# Patient Record
Sex: Male | Born: 1950 | Race: White | Hispanic: No | Marital: Single | Smoking: Never smoker
Health system: Southern US, Community
[De-identification: ages and names within clinical notes are randomized; demographics above are authoritative.]

## PROBLEM LIST (undated history)

## (undated) DIAGNOSIS — M1990 Unspecified osteoarthritis, unspecified site: Secondary | ICD-10-CM

## (undated) DIAGNOSIS — L905 Scar conditions and fibrosis of skin: Secondary | ICD-10-CM

## (undated) DIAGNOSIS — E119 Type 2 diabetes mellitus without complications: Secondary | ICD-10-CM

## (undated) DIAGNOSIS — N186 End stage renal disease: Secondary | ICD-10-CM

## (undated) DIAGNOSIS — M109 Gout, unspecified: Secondary | ICD-10-CM

## (undated) DIAGNOSIS — M791 Myalgia, unspecified site: Secondary | ICD-10-CM

## (undated) DIAGNOSIS — R609 Edema, unspecified: Secondary | ICD-10-CM

## (undated) DIAGNOSIS — I10 Essential (primary) hypertension: Secondary | ICD-10-CM

## (undated) HISTORY — DX: Myalgia, unspecified site: M79.10

## (undated) HISTORY — DX: Edema, unspecified: R60.9

## (undated) HISTORY — DX: Scar conditions and fibrosis of skin: L90.5

## (undated) HISTORY — DX: Unspecified osteoarthritis, unspecified site: M19.90

## (undated) HISTORY — DX: Essential (primary) hypertension: I10

## (undated) HISTORY — PX: BELOW KNEE LEG AMPUTATION: SUR23

## (undated) HISTORY — DX: Gout, unspecified: M10.9

---

## 1998-03-01 ENCOUNTER — Emergency Department (HOSPITAL_COMMUNITY): Admission: EM | Admit: 1998-03-01 | Discharge: 1998-03-01 | Payer: Self-pay | Admitting: Emergency Medicine

## 1998-03-17 ENCOUNTER — Emergency Department (HOSPITAL_COMMUNITY): Admission: EM | Admit: 1998-03-17 | Discharge: 1998-03-17 | Payer: Self-pay | Admitting: Emergency Medicine

## 1998-04-28 ENCOUNTER — Emergency Department (HOSPITAL_COMMUNITY): Admission: EM | Admit: 1998-04-28 | Discharge: 1998-04-28 | Payer: Self-pay | Admitting: Emergency Medicine

## 1998-07-29 ENCOUNTER — Emergency Department (HOSPITAL_COMMUNITY): Admission: EM | Admit: 1998-07-29 | Discharge: 1998-07-29 | Payer: Self-pay | Admitting: Emergency Medicine

## 1998-10-07 ENCOUNTER — Emergency Department (HOSPITAL_COMMUNITY): Admission: EM | Admit: 1998-10-07 | Discharge: 1998-10-07 | Payer: Self-pay

## 2010-04-06 ENCOUNTER — Emergency Department (HOSPITAL_COMMUNITY): Admission: EM | Admit: 2010-04-06 | Discharge: 2010-04-06 | Payer: Self-pay | Admitting: Emergency Medicine

## 2013-10-18 ENCOUNTER — Encounter: Payer: Self-pay | Admitting: Podiatrist

## 2013-10-18 ENCOUNTER — Ambulatory Visit (INDEPENDENT_AMBULATORY_CARE_PROVIDER_SITE_OTHER): Payer: Medicare Other | Admitting: Podiatrist

## 2013-10-18 VITALS — BP 132/86 | HR 100 | Temp 99.4°F | Resp 18

## 2013-10-18 DIAGNOSIS — L97509 Non-pressure chronic ulcer of other part of unspecified foot with unspecified severity: Secondary | ICD-10-CM

## 2013-10-18 MED ORDER — CLINDAMYCIN HCL 300 MG PO CAPS: 300.0000 mg | ORAL_CAPSULE | Freq: Three times a day (TID) | ORAL | Status: DC

## 2013-10-18 MED ORDER — SILVER SULFADIAZINE 1 % EX CREA: 1.0000 "application " | TOPICAL_CREAM | Freq: Every day | CUTANEOUS | Status: DC

## 2013-10-18 NOTE — Progress Notes (Signed)
   Subjective:    Patient ID: Thomas [name], male    DOB: 1951-02-02, 62 y.o.   MRN: [id number]  HPI Nazire presents today for a open wound on the second toe of the left foot. Patient states that he ripped his toe nail out 09/07/2013 and he developed a skin wound at that point in time. He saw Dr. Sheryle Hail in December and he was put on clindamycin and a sulfa antibiotic. He states a sulfa antibiotic gave her a rash and he was switched to a lesser doseage of the clindamycin. He then went to his primary care physician who prescribed Keflex antibiotic of which he states has been ineffective it clearing of the infection of the 2nd toe.  The patient has gout and multiple back problems with multiple back surgeries and he states he has very minimal feeling especially on the left side.   Review of Systems  Constitutional: Positive for activity change and unexpected weight change.  HENT: Negative.   Eyes: Negative.   Respiratory: Negative.   Cardiovascular: Negative.   Gastrointestinal: Positive for constipation.  Endocrine: Positive for cold intolerance.  Genitourinary: Negative.   Musculoskeletal:       Joint and muscle and back pain and difficulty walking  Skin:       Open sores  Allergic/Immunologic: Negative.   Neurological: Positive for weakness and numbness.  Hematological: Negative.   Psychiatric/Behavioral: Negative.        Objective:   Physical Exam GENERAL APPEARANCE: Alert, conversant. Appropriately groomed. No acute distress.  VASCULAR: Pedal pulses palpable at 1/4 dp and pt bilateral.  Capillary refill time is immediate to all digits,  Proximal to distal cooling it warm to warm.   NEUROLOGIC: sensation is diminshed epicritically and protectively to 5.07 monofilament at 0/5 sites bilateral.  Light touch is diminished bilateral, vibratory sensation absent bilateral, achilles tendon reflex is absent bilateral.  MUSCULOSKELETAL: Enlarged left foot from gouty arthritis is noted. Right  foot also has gouty arthritis changes seen however not as significant as the left. The left second toe is significantly swollen up to 4 times larger than the third toe on the left foot. Gouty nodule plantar to the left second metatarsal is also noted. DERMATOLOGIC: Gouty arthritis changes are noted bilaterally. Significant swelling of the left second toe is present. A large ulceration measuring 6 mm in diameter is present with a red granular base noted. Bone also appears to be exposed within the wound as well and the patient states he has seen and within the wound in the past. No streaking or lymphangitis is present no pus or purulence is noted no undermining is seen.  X-ray reviewed after the visit from [facility] on canopy system shows degradation of the middle and distal phalanx of the left second toe. Swelling of the toe is also present. Changes could be consistent with gout versus osteomyelitis    Assessment & Plan:  Ulceration with cortical destruction of left second toe middle and distal phalanx Plan: Debrided all necrotic tissue from the left second toe ulceration. Applied Iodosorb and a dry sterile compressive dressing to the toe in instructed the patient on daily wound care and dressing changes. Prescribed antibiotic of clindamycin 300 mg 3 times a day. I will see him back in one week for recheck. If there is no improvement in symptoms clinically a amputation of the left second toe will be required. Briefly discussed the possibility of an amputation at today's visit with the patient.

## 2013-10-18 NOTE — Patient Instructions (Signed)
Instructions for Wound Care  The most important step to healing a foot wound is to reduce the pressure on your foot - it is extremely important to stay off your foot as much as possible   Cleanse your foot with saline wash or warm soapy water (dial antibacterial soap or similar).  Blot dry.  Apply prescribed medication to your wound and cover with gauze and a bandage.  May hold bandage in place with Coban (self sticky wrap), Ace bandage or tape.  You may find dressing supplies at your local [employer], [employer], drug store or medical supply store.  Your prescribed topical medication is :  Silvadene Cream (twice daily) to start-- then you will receive a tube of Iodosorb Gel and start using this instead once daily on the toe  [facility] [employer] is a mail order medical supply company that we use to provide some of our would care products.  If we use their service of you, you will receive the product by mail.  If you have not received the medication in 3 business days, please call our office.  If you notice any foul odor, increase in pain, pus, increased swelling, red streaks or generalized redness occurring in your foot or leg-Call our office immediately to be seen.  This may be a sign of a limb or life threatening infection that will need prompt attention.  Trudie Buckler, [place]  [phone] [facility]

## 2013-11-01 ENCOUNTER — Ambulatory Visit (INDEPENDENT_AMBULATORY_CARE_PROVIDER_SITE_OTHER): Payer: Medicare Other | Admitting: Podiatrist

## 2013-11-01 ENCOUNTER — Encounter: Payer: Self-pay | Admitting: Podiatrist

## 2013-11-01 VITALS — BP 96/64 | HR 90 | Temp 98.7°F | Resp 17 | Ht 78.0 in | Wt 299.0 lb

## 2013-11-01 DIAGNOSIS — L97509 Non-pressure chronic ulcer of other part of unspecified foot with unspecified severity: Secondary | ICD-10-CM

## 2013-11-01 NOTE — Progress Notes (Deleted)
Subjective: Pt present for follow up of left 2nd toe ulcer.  He has been taking the clindamycin as instructed and has been utilizing Iodosorb and a dressing to the left second toe. He states that it's looking good to him and is improving. Objective: Left second toe continues to be significantly swollen. There is also redness to the toe itself. The ulceration itself appears improved and is filling in from deep to superficial. A nice red granular base is present. Some gouty tophi are noted but no probing to bone is noted. Overall the toe appears improved from the previous visit.  Assessment: Gout and or osteomyelitis left second toe with ulceration  Plan: Debrided the necrotic tissue with a #15 blade without complication. Applied Iodosorb and a dry sterile compressive dressing. Instructed him to continue taking the clindamycin for 2 more weeks. He will continue to use the Iodosorb that time as well. We did talk about an amputation to the second toe if it has not significantly improved as it most likely has osteomyelitis present And will need to be removed. The patient is okay with this thought and we will rediscuss at the next visit.  Trudie Buckler, DPM

## 2013-11-01 NOTE — Patient Instructions (Signed)
Keep up the dressing changes on your left 2nd toe.  Stay on the antibiotic for 2 more weeks-- I will call in a refill for this medication for you.  If there is no improvement at the next visit, we will need to consider an amputation of a part or all of the toe to get the infection out.    Call if any questions or concerns.

## 2013-11-15 ENCOUNTER — Ambulatory Visit (INDEPENDENT_AMBULATORY_CARE_PROVIDER_SITE_OTHER): Payer: Medicare Other | Admitting: Podiatrist

## 2013-11-15 ENCOUNTER — Encounter: Payer: Self-pay | Admitting: Podiatrist

## 2013-11-15 VITALS — BP 118/78 | HR 98 | Resp 18

## 2013-11-15 DIAGNOSIS — L97509 Non-pressure chronic ulcer of other part of unspecified foot with unspecified severity: Secondary | ICD-10-CM

## 2013-11-15 NOTE — Patient Instructions (Signed)
Keep using the orange iodosorb on your toe.  Your toe looks significantly improved!

## 2013-11-15 NOTE — Progress Notes (Signed)
Subjective:  Pt presents stating "I am doing better on my 2nd toe on my left foot and there is some draining and I get my nerve block tomorrow."  Patient relates part of the toenail got ingrown and he pulled it out.  He also states he lanced the skin with a razor blade and all that came out was blood.  He has a nerve block for his back tomorrow and stopped the antibiotics 2 weeks ago in preparation for the block Objective: Improvement of the left second toe ulcer is noted however the Left second toe continues to be significantly swollen. The redness to the toe has improved despite being off antibiotics for 2 weeks. The ulceration itself appears improved and is filling in from deep to superficial. A nice red granular base is present. No gouty tophi is seen in no probing to bone is noted. Red granular base is noted with some superficial fibrotic tissue which is easily removed. Measurements continue to measure 5 mm x 4 mm depth is 1 mm. Assessment: Gout and or osteomyelitis left second toe with ulceration  Plan: Debrided the necrotic tissue with a #15 blade without complication. Applied Iodosorb and a dry sterile compressive dressing. Instructed him to resume taking the clindamycin when he is able after the block. He will continue to use the Iodosorb that time as well. We'll see him back in 3 weeks.

## 2013-11-15 NOTE — Progress Notes (Signed)
Subjective: Pt present for follow up of left 2nd toe ulcer.  He has been taking the clindamycin as instructed and has been utilizing Iodosorb and a dressing to the left second toe. He states that it's looking good to him and is improving. Objective: Left second toe continues to be significantly swollen. There is also redness to the toe itself. The ulceration itself appears improved and is filling in from deep to superficial. A nice red granular base is present. Some gouty tophi are noted but no probing to bone is noted. Overall the toe appears improved from the previous visit.  Assessment: Gout and or osteomyelitis left second toe with ulceration  Plan: Debrided the necrotic tissue with a #15 blade without complication. Applied Iodosorb and a dry sterile compressive dressing. Instructed him to continue taking the clindamycin for 2 more weeks. He will continue to use the Iodosorb that time as well. We did talk about an amputation to the second toe if it has not significantly improved as it most likely has osteomyelitis present And will need to be removed. The patient is okay with this thought and we will rediscuss at the next visit.  Trudie Buckler, DPM

## 2013-12-06 ENCOUNTER — Ambulatory Visit: Payer: Medicare Other | Admitting: Podiatrist

## 2016-03-20 DIAGNOSIS — N400 Enlarged prostate without lower urinary tract symptoms: Secondary | ICD-10-CM

## 2016-04-03 DIAGNOSIS — M109 Gout, unspecified: Secondary | ICD-10-CM

## 2016-04-03 DIAGNOSIS — E875 Hyperkalemia: Secondary | ICD-10-CM

## 2016-04-03 DIAGNOSIS — I25810 Atherosclerosis of coronary artery bypass graft(s) without angina pectoris: Secondary | ICD-10-CM

## 2016-04-03 DIAGNOSIS — E119 Type 2 diabetes mellitus without complications: Secondary | ICD-10-CM

## 2016-04-03 DIAGNOSIS — N179 Acute kidney failure, unspecified: Secondary | ICD-10-CM

## 2016-04-03 DIAGNOSIS — N390 Urinary tract infection, site not specified: Secondary | ICD-10-CM

## 2016-04-03 DIAGNOSIS — D539 Nutritional anemia, unspecified: Secondary | ICD-10-CM

## 2016-04-04 DIAGNOSIS — M109 Gout, unspecified: Secondary | ICD-10-CM

## 2016-04-04 DIAGNOSIS — D539 Nutritional anemia, unspecified: Secondary | ICD-10-CM

## 2016-04-04 DIAGNOSIS — E119 Type 2 diabetes mellitus without complications: Secondary | ICD-10-CM

## 2016-04-04 DIAGNOSIS — N390 Urinary tract infection, site not specified: Secondary | ICD-10-CM

## 2016-04-04 DIAGNOSIS — I25810 Atherosclerosis of coronary artery bypass graft(s) without angina pectoris: Secondary | ICD-10-CM | POA: Diagnosis not present

## 2016-04-04 DIAGNOSIS — N179 Acute kidney failure, unspecified: Secondary | ICD-10-CM

## 2016-04-04 DIAGNOSIS — E162 Hypoglycemia, unspecified: Secondary | ICD-10-CM

## 2016-04-04 DIAGNOSIS — E875 Hyperkalemia: Secondary | ICD-10-CM | POA: Diagnosis not present

## 2016-04-05 DIAGNOSIS — N179 Acute kidney failure, unspecified: Secondary | ICD-10-CM

## 2016-04-05 DIAGNOSIS — E875 Hyperkalemia: Secondary | ICD-10-CM | POA: Diagnosis not present

## 2016-04-05 DIAGNOSIS — I25810 Atherosclerosis of coronary artery bypass graft(s) without angina pectoris: Secondary | ICD-10-CM | POA: Diagnosis not present

## 2016-04-05 DIAGNOSIS — E119 Type 2 diabetes mellitus without complications: Secondary | ICD-10-CM

## 2016-04-05 DIAGNOSIS — N390 Urinary tract infection, site not specified: Secondary | ICD-10-CM

## 2016-04-05 DIAGNOSIS — D539 Nutritional anemia, unspecified: Secondary | ICD-10-CM

## 2016-04-05 DIAGNOSIS — M109 Gout, unspecified: Secondary | ICD-10-CM

## 2016-04-05 DIAGNOSIS — E162 Hypoglycemia, unspecified: Secondary | ICD-10-CM

## 2016-04-06 DIAGNOSIS — I25810 Atherosclerosis of coronary artery bypass graft(s) without angina pectoris: Secondary | ICD-10-CM

## 2016-04-06 DIAGNOSIS — E119 Type 2 diabetes mellitus without complications: Secondary | ICD-10-CM

## 2016-04-06 DIAGNOSIS — E162 Hypoglycemia, unspecified: Secondary | ICD-10-CM

## 2016-04-06 DIAGNOSIS — E875 Hyperkalemia: Secondary | ICD-10-CM

## 2016-04-06 DIAGNOSIS — D539 Nutritional anemia, unspecified: Secondary | ICD-10-CM

## 2016-04-06 DIAGNOSIS — N179 Acute kidney failure, unspecified: Secondary | ICD-10-CM

## 2016-04-06 DIAGNOSIS — N390 Urinary tract infection, site not specified: Secondary | ICD-10-CM

## 2016-04-06 DIAGNOSIS — M109 Gout, unspecified: Secondary | ICD-10-CM

## 2016-04-07 DIAGNOSIS — M109 Gout, unspecified: Secondary | ICD-10-CM

## 2016-04-07 DIAGNOSIS — D539 Nutritional anemia, unspecified: Secondary | ICD-10-CM

## 2016-04-07 DIAGNOSIS — E162 Hypoglycemia, unspecified: Secondary | ICD-10-CM

## 2016-04-07 DIAGNOSIS — I25810 Atherosclerosis of coronary artery bypass graft(s) without angina pectoris: Secondary | ICD-10-CM | POA: Diagnosis not present

## 2016-04-07 DIAGNOSIS — N390 Urinary tract infection, site not specified: Secondary | ICD-10-CM

## 2016-04-07 DIAGNOSIS — N179 Acute kidney failure, unspecified: Secondary | ICD-10-CM | POA: Diagnosis not present

## 2016-04-07 DIAGNOSIS — E119 Type 2 diabetes mellitus without complications: Secondary | ICD-10-CM

## 2016-04-07 DIAGNOSIS — E875 Hyperkalemia: Secondary | ICD-10-CM

## 2016-04-19 DIAGNOSIS — N400 Enlarged prostate without lower urinary tract symptoms: Secondary | ICD-10-CM

## 2016-04-19 DIAGNOSIS — N390 Urinary tract infection, site not specified: Secondary | ICD-10-CM | POA: Diagnosis not present

## 2016-04-19 DIAGNOSIS — N289 Disorder of kidney and ureter, unspecified: Secondary | ICD-10-CM

## 2016-04-19 DIAGNOSIS — E119 Type 2 diabetes mellitus without complications: Secondary | ICD-10-CM

## 2016-04-19 DIAGNOSIS — R531 Weakness: Secondary | ICD-10-CM | POA: Diagnosis not present

## 2016-04-19 DIAGNOSIS — D519 Vitamin B12 deficiency anemia, unspecified: Secondary | ICD-10-CM

## 2016-04-19 DIAGNOSIS — M109 Gout, unspecified: Secondary | ICD-10-CM | POA: Diagnosis not present

## 2016-04-20 DIAGNOSIS — M109 Gout, unspecified: Secondary | ICD-10-CM | POA: Diagnosis not present

## 2016-04-20 DIAGNOSIS — N390 Urinary tract infection, site not specified: Secondary | ICD-10-CM | POA: Diagnosis not present

## 2016-04-20 DIAGNOSIS — N289 Disorder of kidney and ureter, unspecified: Secondary | ICD-10-CM | POA: Diagnosis not present

## 2016-04-20 DIAGNOSIS — R531 Weakness: Secondary | ICD-10-CM | POA: Diagnosis not present

## 2016-04-21 DIAGNOSIS — N390 Urinary tract infection, site not specified: Secondary | ICD-10-CM | POA: Diagnosis not present

## 2016-04-21 DIAGNOSIS — N289 Disorder of kidney and ureter, unspecified: Secondary | ICD-10-CM | POA: Diagnosis not present

## 2016-04-21 DIAGNOSIS — M109 Gout, unspecified: Secondary | ICD-10-CM | POA: Diagnosis not present

## 2016-04-21 DIAGNOSIS — R531 Weakness: Secondary | ICD-10-CM | POA: Diagnosis not present

## 2016-05-07 DIAGNOSIS — E1122 Type 2 diabetes mellitus with diabetic chronic kidney disease: Secondary | ICD-10-CM

## 2016-05-07 DIAGNOSIS — N179 Acute kidney failure, unspecified: Secondary | ICD-10-CM | POA: Diagnosis not present

## 2016-05-07 DIAGNOSIS — I10 Essential (primary) hypertension: Secondary | ICD-10-CM

## 2016-05-07 DIAGNOSIS — E669 Obesity, unspecified: Secondary | ICD-10-CM

## 2016-05-07 DIAGNOSIS — M109 Gout, unspecified: Secondary | ICD-10-CM

## 2016-05-07 DIAGNOSIS — R001 Bradycardia, unspecified: Secondary | ICD-10-CM | POA: Diagnosis not present

## 2016-05-07 DIAGNOSIS — N132 Hydronephrosis with renal and ureteral calculous obstruction: Secondary | ICD-10-CM

## 2016-05-07 DIAGNOSIS — I5033 Acute on chronic diastolic (congestive) heart failure: Secondary | ICD-10-CM | POA: Diagnosis not present

## 2016-05-07 DIAGNOSIS — R55 Syncope and collapse: Secondary | ICD-10-CM | POA: Diagnosis not present

## 2016-08-25 DIAGNOSIS — E1122 Type 2 diabetes mellitus with diabetic chronic kidney disease: Secondary | ICD-10-CM | POA: Diagnosis not present

## 2016-08-25 DIAGNOSIS — M109 Gout, unspecified: Secondary | ICD-10-CM | POA: Diagnosis not present

## 2016-08-25 DIAGNOSIS — R55 Syncope and collapse: Secondary | ICD-10-CM | POA: Diagnosis not present

## 2016-08-25 DIAGNOSIS — I5030 Unspecified diastolic (congestive) heart failure: Secondary | ICD-10-CM | POA: Diagnosis not present

## 2016-08-26 DIAGNOSIS — M109 Gout, unspecified: Secondary | ICD-10-CM | POA: Diagnosis not present

## 2016-08-26 DIAGNOSIS — E1122 Type 2 diabetes mellitus with diabetic chronic kidney disease: Secondary | ICD-10-CM | POA: Diagnosis not present

## 2016-08-26 DIAGNOSIS — I5030 Unspecified diastolic (congestive) heart failure: Secondary | ICD-10-CM | POA: Diagnosis not present

## 2016-08-26 DIAGNOSIS — R55 Syncope and collapse: Secondary | ICD-10-CM | POA: Diagnosis not present

## 2017-02-23 DIAGNOSIS — I25810 Atherosclerosis of coronary artery bypass graft(s) without angina pectoris: Secondary | ICD-10-CM | POA: Diagnosis not present

## 2017-02-23 DIAGNOSIS — N132 Hydronephrosis with renal and ureteral calculous obstruction: Secondary | ICD-10-CM

## 2017-02-23 DIAGNOSIS — E785 Hyperlipidemia, unspecified: Secondary | ICD-10-CM | POA: Diagnosis not present

## 2017-02-23 DIAGNOSIS — I10 Essential (primary) hypertension: Secondary | ICD-10-CM | POA: Diagnosis not present

## 2017-02-23 DIAGNOSIS — E1122 Type 2 diabetes mellitus with diabetic chronic kidney disease: Secondary | ICD-10-CM

## 2017-02-23 DIAGNOSIS — I5030 Unspecified diastolic (congestive) heart failure: Secondary | ICD-10-CM

## 2017-02-24 DIAGNOSIS — E785 Hyperlipidemia, unspecified: Secondary | ICD-10-CM | POA: Diagnosis not present

## 2017-02-24 DIAGNOSIS — E1122 Type 2 diabetes mellitus with diabetic chronic kidney disease: Secondary | ICD-10-CM | POA: Diagnosis not present

## 2017-02-24 DIAGNOSIS — I25810 Atherosclerosis of coronary artery bypass graft(s) without angina pectoris: Secondary | ICD-10-CM | POA: Diagnosis not present

## 2017-02-24 DIAGNOSIS — I5030 Unspecified diastolic (congestive) heart failure: Secondary | ICD-10-CM | POA: Diagnosis not present

## 2017-02-24 DIAGNOSIS — N132 Hydronephrosis with renal and ureteral calculous obstruction: Secondary | ICD-10-CM | POA: Diagnosis not present

## 2017-02-24 DIAGNOSIS — I10 Essential (primary) hypertension: Secondary | ICD-10-CM | POA: Diagnosis not present

## 2017-02-25 DIAGNOSIS — E1122 Type 2 diabetes mellitus with diabetic chronic kidney disease: Secondary | ICD-10-CM | POA: Diagnosis not present

## 2017-02-25 DIAGNOSIS — I25810 Atherosclerosis of coronary artery bypass graft(s) without angina pectoris: Secondary | ICD-10-CM | POA: Diagnosis not present

## 2017-02-25 DIAGNOSIS — E785 Hyperlipidemia, unspecified: Secondary | ICD-10-CM | POA: Diagnosis not present

## 2017-02-25 DIAGNOSIS — I5030 Unspecified diastolic (congestive) heart failure: Secondary | ICD-10-CM | POA: Diagnosis not present

## 2017-02-25 DIAGNOSIS — I10 Essential (primary) hypertension: Secondary | ICD-10-CM | POA: Diagnosis not present

## 2017-02-25 DIAGNOSIS — N132 Hydronephrosis with renal and ureteral calculous obstruction: Secondary | ICD-10-CM | POA: Diagnosis not present

## 2017-02-26 DIAGNOSIS — N132 Hydronephrosis with renal and ureteral calculous obstruction: Secondary | ICD-10-CM | POA: Diagnosis not present

## 2017-02-26 DIAGNOSIS — I10 Essential (primary) hypertension: Secondary | ICD-10-CM | POA: Diagnosis not present

## 2017-02-26 DIAGNOSIS — I5030 Unspecified diastolic (congestive) heart failure: Secondary | ICD-10-CM | POA: Diagnosis not present

## 2017-02-26 DIAGNOSIS — E785 Hyperlipidemia, unspecified: Secondary | ICD-10-CM | POA: Diagnosis not present

## 2017-02-26 DIAGNOSIS — E1122 Type 2 diabetes mellitus with diabetic chronic kidney disease: Secondary | ICD-10-CM | POA: Diagnosis not present

## 2017-02-26 DIAGNOSIS — E11621 Type 2 diabetes mellitus with foot ulcer: Secondary | ICD-10-CM | POA: Diagnosis not present

## 2017-02-27 DIAGNOSIS — I10 Essential (primary) hypertension: Secondary | ICD-10-CM | POA: Diagnosis not present

## 2017-02-27 DIAGNOSIS — E785 Hyperlipidemia, unspecified: Secondary | ICD-10-CM | POA: Diagnosis not present

## 2017-02-27 DIAGNOSIS — I5030 Unspecified diastolic (congestive) heart failure: Secondary | ICD-10-CM | POA: Diagnosis not present

## 2017-02-27 DIAGNOSIS — E1122 Type 2 diabetes mellitus with diabetic chronic kidney disease: Secondary | ICD-10-CM | POA: Diagnosis not present

## 2017-02-27 DIAGNOSIS — N132 Hydronephrosis with renal and ureteral calculous obstruction: Secondary | ICD-10-CM | POA: Diagnosis not present

## 2017-02-27 DIAGNOSIS — I25810 Atherosclerosis of coronary artery bypass graft(s) without angina pectoris: Secondary | ICD-10-CM | POA: Diagnosis not present

## 2017-02-28 DIAGNOSIS — I5030 Unspecified diastolic (congestive) heart failure: Secondary | ICD-10-CM | POA: Diagnosis not present

## 2017-02-28 DIAGNOSIS — E1122 Type 2 diabetes mellitus with diabetic chronic kidney disease: Secondary | ICD-10-CM | POA: Diagnosis not present

## 2017-02-28 DIAGNOSIS — E785 Hyperlipidemia, unspecified: Secondary | ICD-10-CM | POA: Diagnosis not present

## 2017-02-28 DIAGNOSIS — I10 Essential (primary) hypertension: Secondary | ICD-10-CM | POA: Diagnosis not present

## 2017-02-28 DIAGNOSIS — I25810 Atherosclerosis of coronary artery bypass graft(s) without angina pectoris: Secondary | ICD-10-CM | POA: Diagnosis not present

## 2017-02-28 DIAGNOSIS — N132 Hydronephrosis with renal and ureteral calculous obstruction: Secondary | ICD-10-CM | POA: Diagnosis not present

## 2017-03-01 DIAGNOSIS — I5030 Unspecified diastolic (congestive) heart failure: Secondary | ICD-10-CM | POA: Diagnosis not present

## 2017-03-01 DIAGNOSIS — E1122 Type 2 diabetes mellitus with diabetic chronic kidney disease: Secondary | ICD-10-CM | POA: Diagnosis not present

## 2017-03-01 DIAGNOSIS — I10 Essential (primary) hypertension: Secondary | ICD-10-CM | POA: Diagnosis not present

## 2017-03-01 DIAGNOSIS — N132 Hydronephrosis with renal and ureteral calculous obstruction: Secondary | ICD-10-CM | POA: Diagnosis not present

## 2017-03-01 DIAGNOSIS — E785 Hyperlipidemia, unspecified: Secondary | ICD-10-CM | POA: Diagnosis not present

## 2017-03-01 DIAGNOSIS — I25810 Atherosclerosis of coronary artery bypass graft(s) without angina pectoris: Secondary | ICD-10-CM | POA: Diagnosis not present

## 2017-03-02 DIAGNOSIS — E11621 Type 2 diabetes mellitus with foot ulcer: Secondary | ICD-10-CM

## 2017-03-02 DIAGNOSIS — I10 Essential (primary) hypertension: Secondary | ICD-10-CM | POA: Diagnosis not present

## 2017-03-02 DIAGNOSIS — E1122 Type 2 diabetes mellitus with diabetic chronic kidney disease: Secondary | ICD-10-CM | POA: Diagnosis not present

## 2017-03-02 DIAGNOSIS — N132 Hydronephrosis with renal and ureteral calculous obstruction: Secondary | ICD-10-CM | POA: Diagnosis not present

## 2017-03-02 DIAGNOSIS — L97509 Non-pressure chronic ulcer of other part of unspecified foot with unspecified severity: Secondary | ICD-10-CM | POA: Diagnosis not present

## 2017-03-02 DIAGNOSIS — E785 Hyperlipidemia, unspecified: Secondary | ICD-10-CM | POA: Diagnosis not present

## 2017-03-02 DIAGNOSIS — I25810 Atherosclerosis of coronary artery bypass graft(s) without angina pectoris: Secondary | ICD-10-CM | POA: Diagnosis not present

## 2017-03-02 DIAGNOSIS — I5030 Unspecified diastolic (congestive) heart failure: Secondary | ICD-10-CM | POA: Diagnosis not present

## 2017-03-03 ENCOUNTER — Encounter: Payer: Self-pay | Admitting: Sports Medicine

## 2017-03-18 ENCOUNTER — Ambulatory Visit: Payer: Medicare Other | Admitting: Sports Medicine

## 2017-03-23 DIAGNOSIS — N400 Enlarged prostate without lower urinary tract symptoms: Secondary | ICD-10-CM | POA: Diagnosis not present

## 2017-03-23 DIAGNOSIS — K219 Gastro-esophageal reflux disease without esophagitis: Secondary | ICD-10-CM | POA: Diagnosis not present

## 2017-03-23 DIAGNOSIS — M109 Gout, unspecified: Secondary | ICD-10-CM | POA: Diagnosis not present

## 2017-03-23 DIAGNOSIS — N183 Chronic kidney disease, stage 3 (moderate): Secondary | ICD-10-CM

## 2017-03-23 DIAGNOSIS — E1122 Type 2 diabetes mellitus with diabetic chronic kidney disease: Secondary | ICD-10-CM | POA: Diagnosis not present

## 2017-03-23 DIAGNOSIS — E785 Hyperlipidemia, unspecified: Secondary | ICD-10-CM

## 2017-03-23 DIAGNOSIS — N200 Calculus of kidney: Secondary | ICD-10-CM | POA: Diagnosis not present

## 2017-03-24 DIAGNOSIS — K219 Gastro-esophageal reflux disease without esophagitis: Secondary | ICD-10-CM | POA: Diagnosis not present

## 2017-03-24 DIAGNOSIS — N400 Enlarged prostate without lower urinary tract symptoms: Secondary | ICD-10-CM | POA: Diagnosis not present

## 2017-03-24 DIAGNOSIS — N200 Calculus of kidney: Secondary | ICD-10-CM | POA: Diagnosis not present

## 2017-03-24 DIAGNOSIS — E785 Hyperlipidemia, unspecified: Secondary | ICD-10-CM | POA: Diagnosis not present

## 2017-03-24 DIAGNOSIS — M109 Gout, unspecified: Secondary | ICD-10-CM | POA: Diagnosis not present

## 2017-03-24 DIAGNOSIS — E1122 Type 2 diabetes mellitus with diabetic chronic kidney disease: Secondary | ICD-10-CM | POA: Diagnosis not present

## 2017-03-24 DIAGNOSIS — N183 Chronic kidney disease, stage 3 (moderate): Secondary | ICD-10-CM | POA: Diagnosis not present

## 2017-04-23 ENCOUNTER — Ambulatory Visit (INDEPENDENT_AMBULATORY_CARE_PROVIDER_SITE_OTHER): Payer: Medicare Other | Admitting: Sports Medicine

## 2017-04-23 DIAGNOSIS — L97511 Non-pressure chronic ulcer of other part of right foot limited to breakdown of skin: Secondary | ICD-10-CM

## 2017-04-23 DIAGNOSIS — M10071 Idiopathic gout, right ankle and foot: Secondary | ICD-10-CM | POA: Diagnosis not present

## 2017-04-23 DIAGNOSIS — M79671 Pain in right foot: Secondary | ICD-10-CM

## 2017-04-23 DIAGNOSIS — E11621 Type 2 diabetes mellitus with foot ulcer: Secondary | ICD-10-CM

## 2017-04-23 DIAGNOSIS — M779 Enthesopathy, unspecified: Secondary | ICD-10-CM | POA: Diagnosis not present

## 2017-04-23 MED ORDER — TRIAMCINOLONE ACETONIDE 10 MG/ML IJ SUSP: 10.0000 mg | Freq: Once | INTRAMUSCULAR | Status: DC

## 2017-04-23 NOTE — Progress Notes (Signed)
   Subjective:    Patient ID: Thomas [name], male    DOB: 1951/07/26, 66 y.o.   MRN: [id number]  HPI  I have an ulcer on the bottom of my right foot I have no idea how long it has been there the hospital found it when I was admitted in May.    Review of Systems  Constitutional: Positive for activity change and fatigue.  HENT: Positive for sore throat and tinnitus.   Eyes: Positive for visual disturbance.  Respiratory: Positive for shortness of breath.   Cardiovascular: Positive for leg swelling.  Gastrointestinal: Positive for constipation.  Endocrine: Positive for cold intolerance.  Skin: Positive for color change, rash and wound.  Neurological: Positive for weakness and numbness.  All other systems reviewed and are negative.      Objective:   Physical Exam        Assessment & Plan:

## 2017-04-23 NOTE — Progress Notes (Signed)
Subjective: Thomas [name] is a 66 y.o. male patient seen in office for evaluation of ulceration of the right foot. Patient has a history of diabetes and a blood glucose level today not recorded. Patient is changing the dressing using bactroban at home. Denies nausea/fever/vomiting/chills/night sweats/shortness of breath/pain. Patient states that he has new pain on side of right foot with a new gouty bump. Patient has no other pedal complaints at this time.  There are no active problems to display for this patient.  Current Outpatient Prescriptions on File Prior to Visit  Medication Sig Dispense Refill  . clindamycin (CLEOCIN) 300 MG capsule Take 1 capsule (300 mg total) by mouth 3 (three) times daily. (Patient not taking: Reported on 04/23/2017) 30 capsule 2  . silver sulfADIAZINE (SILVADENE) 1 % cream Apply 1 application topically daily. (Patient not taking: Reported on 04/23/2017) 50 g 0   No current facility-administered medications on file prior to visit.    Allergies  Allergen Reactions  . Oxycodone-Acetaminophen Other (See Comments)    Blood pressure issues  . Oxymorphone Other (See Comments)    Causes kidney problems  . Beta Vulgaris   . Buspirone   . Cabbage   . Codeine   . Fish-Derived Products   . Pentazocine   . Propoxyphene   . Shellfish-Derived Products   . Sulfa Antibiotics Hives    No results found for this or any previous visit (from the past 2160 hour(s)).  Objective: There were no vitals filed for this visit.  General: Patient is awake, alert, oriented x 3 and in no acute distress.  Dermatology: Skin is warm and dry bilateral with a partial thickness ulceration present right plantar midfoot. Ulceration measures 1 cm x 1 cm x 0.3cm cm. There is a keratotic border with a 100% granular base. The ulceration does not probe to bone. There is no malodor, no active drainage, no erythema, no edema. 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 No acute signs of infection.    Vascular: Dorsalis Pedis pulse = 1/4 Bilateral,  Posterior Tibial pulse =1 /4 Bilateral,  Capillary Fill Time < 5 seconds  Neurologic: Protective sensation absent bilateral.  Musculosketal: There is pain at the 5th met head on right with new tophi and severe foot deformity secondary to severe gout.   No results for input(s): GRAMSTAIN, LABORGA in the last 8760 hours.  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, limited to breakdown of skin ([place])    -  Primary   Relevant Medications   glipiZIDE (GLUCOTROL XL) 10 MG 24 hr tablet   Foot pain, right       Relevant Medications   triamcinolone acetonide (KENALOG) 10 MG/ML injection 10 mg (Start on 04/23/2017 10:15 PM)   Acute idiopathic gout of right foot       Relevant Medications   colchicine 0.6 MG tablet   tiZANidine (ZANAFLEX) 4 MG tablet   triamcinolone acetonide (KENALOG) 10 MG/ML injection 10 mg (Start on 04/23/2017 10:15 PM)   Capsulitis       Relevant Medications   triamcinolone acetonide (KENALOG) 10 MG/ML injection 10 mg (Start on 04/23/2017 10:15 PM)     -Examined patient and discussed the progression of the wound and treatment alternatives. - Excisionally dedbrided ulceration to healthy bleeding borders using a sterile chisel  blade. -Applied bactroban and dry  sterile dressing and instructed patient to continue with daily dressings at home consisting of the same - Advised patient to go to the ER or return to office if the wound worsens or if constitutional symptoms are present. -After oral consent and aseptic prep, injected a mixture containing 1 ml of 2% plain lidocaine, 1 ml 0.5% plain marcaine, 0.5 ml of kenalog 10 and 0.5 ml of dexamethasone phosphate into right 5th metatarsophalangeal joint without complication. Post-injection care discussed with patient.  -Patient to return to office in 3  weeks for follow up care and evaluation or sooner if problems arise.  Landis Martins, DPM

## 2017-05-15 ENCOUNTER — Ambulatory Visit (INDEPENDENT_AMBULATORY_CARE_PROVIDER_SITE_OTHER): Payer: Medicare Other | Admitting: Sports Medicine

## 2017-05-15 DIAGNOSIS — E11621 Type 2 diabetes mellitus with foot ulcer: Secondary | ICD-10-CM

## 2017-05-15 DIAGNOSIS — M79671 Pain in right foot: Secondary | ICD-10-CM

## 2017-05-15 DIAGNOSIS — L97511 Non-pressure chronic ulcer of other part of right foot limited to breakdown of skin: Secondary | ICD-10-CM | POA: Diagnosis not present

## 2017-05-15 DIAGNOSIS — Z8739 Personal history of other diseases of the musculoskeletal system and connective tissue: Secondary | ICD-10-CM

## 2017-05-15 DIAGNOSIS — M10071 Idiopathic gout, right ankle and foot: Secondary | ICD-10-CM

## 2017-05-15 NOTE — Progress Notes (Signed)
Subjective: Thomas [name] is a 66 y.o. male patient seen in office for follow-up evaluation of ulceration of the right foot. Patient has a history of diabetes and a blood glucose level today not recorded. Patient is changing the dressing using bactroban at home. Denies nausea/fever/vomiting/chills/night sweats/shortness of breath/pain. Patient states that pain at the right fifth toe is better after I gave him an injection last visit. Patient has no other pedal complaints at this time.  There are no active problems to display for this patient.  Current Outpatient Prescriptions on File Prior to Visit  Medication Sig Dispense Refill  . furosemide (LASIX) 40 MG tablet Take 40 mg by mouth 2 (two) times daily.    . bethanechol (URECHOLINE) 50 MG tablet Take 50 mg by mouth.    . clindamycin (CLEOCIN) 300 MG capsule Take 1 capsule (300 mg total) by mouth 3 (three) times daily. (Patient not taking: Reported on 04/23/2017) 30 capsule 2  . cloNIDine (CATAPRES) 0.3 MG tablet Take 0.3 mg by mouth.    . colchicine 0.6 MG tablet Take 0.6 mg by mouth.    . gabapentin (NEURONTIN) 300 MG capsule     . glipiZIDE (GLUCOTROL XL) 10 MG 24 hr tablet     . Levothyroxine Sodium (SYNTHROID PO) Take by mouth.    . mupirocin ointment (BACTROBAN) 2 % Place 1 application into the nose 2 (two) times daily.    . silver sulfADIAZINE (SILVADENE) 1 % cream Apply 1 application topically daily. (Patient not taking: Reported on 04/23/2017) 50 g 0  . tiZANidine (ZANAFLEX) 4 MG tablet      Current Facility-Administered Medications on File Prior to Visit  Medication Dose Route Frequency Provider Last Rate Last Dose  . triamcinolone acetonide (KENALOG) 10 MG/ML injection 10 mg  10 mg Other Once Landis Martins, DPM       Allergies  Allergen Reactions  . Oxycodone-Acetaminophen Other (See Comments)    Blood pressure issues  . Oxymorphone Other (See Comments)    Causes kidney problems  . Beta Vulgaris   . Buspirone   . Cabbage    . Codeine   . Fish-Derived Products   . Pentazocine   . Propoxyphene   . Shellfish-Derived Products   . Sulfa Antibiotics Hives    No results found for this or any previous visit (from the past 2160 hour(s)).  Objective: There were no vitals filed for this visit.  General: Patient is awake, alert, oriented x 3 and in no acute distress.  Dermatology: Skin is warm and dry bilateral with a partial thickness ulceration present right plantar midfoot. Ulceration measures 2 cm x 1.8 cm x 0.3cm larger than previous. There is a keratotic border with a 100% granular base. The ulceration does not probe to bone. There is no malodor, no active drainage, no erythema, no edema. 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 No acute signs of infection.   Vascular: Dorsalis Pedis pulse = 1/4 Bilateral,  Posterior Tibial pulse =1 /4 Bilateral,  Capillary Fill Time < 5 seconds  Neurologic: Protective sensation absent bilateral.  Musculosketal: There is no pain at the 5th met head on right  and severe foot deformity secondary to severe gout.   No results for input(s): GRAMSTAIN, LABORGA in the last 8760 hours.  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 - Excisionally dedbrided ulceration to healthy bleeding borders using a sterile chisel  blade. -Applied Prisma and dry sterile dressing and instructed patient to continue with daily dressings at home consisting of the same - Advised patient to go to the ER or return to office if the wound worsens or if constitutional symptoms are present. -Continue with custom  shoes -Patient to return to office in 2-3 weeks for follow up care and evaluation or sooner if problems arise.  Landis Martins, DPM

## 2017-05-29 ENCOUNTER — Ambulatory Visit (INDEPENDENT_AMBULATORY_CARE_PROVIDER_SITE_OTHER): Payer: Medicare Other | Admitting: Sports Medicine

## 2017-05-29 DIAGNOSIS — M79671 Pain in right foot: Secondary | ICD-10-CM

## 2017-05-29 DIAGNOSIS — L97521 Non-pressure chronic ulcer of other part of left foot limited to breakdown of skin: Secondary | ICD-10-CM

## 2017-05-29 DIAGNOSIS — M10071 Idiopathic gout, right ankle and foot: Secondary | ICD-10-CM

## 2017-05-29 DIAGNOSIS — L97511 Non-pressure chronic ulcer of other part of right foot limited to breakdown of skin: Secondary | ICD-10-CM

## 2017-05-29 DIAGNOSIS — E11621 Type 2 diabetes mellitus with foot ulcer: Secondary | ICD-10-CM

## 2017-05-29 NOTE — Progress Notes (Addendum)
Subjective: Thomas [name] is a 66 y.o. male patient seen in office for follow-up evaluation of ulceration of the right foot. Patient has a history of diabetes and a blood glucose level today not recorded. Patient is changing the dressing using bactroban at home states that the PRISMA pad made his foot hurt. Denies nausea/fever/vomiting/chills/night sweats/shortness of breath/pain. Patient states that he has a new wound on left and has been boot bactroban on this as well. Patient has no other pedal complaints at this time.  There are no active problems to display for this patient.  Current Outpatient Prescriptions on File Prior to Visit  Medication Sig Dispense Refill  . bethanechol (URECHOLINE) 50 MG tablet Take 50 mg by mouth.    . clindamycin (CLEOCIN) 300 MG capsule Take 1 capsule (300 mg total) by mouth 3 (three) times daily. (Patient not taking: Reported on 04/23/2017) 30 capsule 2  . cloNIDine (CATAPRES) 0.3 MG tablet Take 0.3 mg by mouth.    . colchicine 0.6 MG tablet Take 0.6 mg by mouth.    . furosemide (LASIX) 40 MG tablet Take 40 mg by mouth 2 (two) times daily.    Marland Kitchen gabapentin (NEURONTIN) 300 MG capsule     . glipiZIDE (GLUCOTROL XL) 10 MG 24 hr tablet     . Levothyroxine Sodium (SYNTHROID PO) Take by mouth.    . mupirocin ointment (BACTROBAN) 2 % Place 1 application into the nose 2 (two) times daily.    . silver sulfADIAZINE (SILVADENE) 1 % cream Apply 1 application topically daily. (Patient not taking: Reported on 04/23/2017) 50 g 0  . tiZANidine (ZANAFLEX) 4 MG tablet      Current Facility-Administered Medications on File Prior to Visit  Medication Dose Route Frequency Provider Last Rate Last Dose  . triamcinolone acetonide (KENALOG) 10 MG/ML injection 10 mg  10 mg Other Once Landis Martins, DPM       Allergies  Allergen Reactions  . Oxycodone-Acetaminophen Other (See Comments)    Blood pressure issues  . Oxymorphone Other (See Comments)    Causes kidney problems  . Beta  Vulgaris   . Buspirone   . Cabbage   . Codeine   . Fish-Derived Products   . Pentazocine   . Propoxyphene   . Shellfish-Derived Products   . Sulfa Antibiotics Hives    No results found for this or any previous visit (from the past 2160 hour(s)).  Objective: There were no vitals filed for this visit.  General: Patient is awake, alert, oriented x 3 and in no acute distress.  Dermatology: Skin is warm and dry bilateral with a partial thickness ulceration present right plantar midfoot. Ulceration measures 2 cm x 2 cm x 0.3cm same as previous. There is a keratotic border with a 100% granular base. The ulceration does not probe to bone. There is no malodor, no active drainage, no erythema, no edema. New ulceration sub met 2 on left measures 0.5x0.5cm limited to skin with there is a keratotic border with a 100% granular base. The ulceration does not probe to bone. There is no malodor, no active drainage, no erythema, no edema. 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 No acute signs of infection.   Vascular: Dorsalis Pedis pulse = 1/4 Bilateral,  Posterior Tibial pulse =1 /4 Bilateral,  Capillary Fill Time < 5 seconds  Neurologic: Protective sensation absent bilateral.  Musculosketal: There is no pain at the 5th met head on right  and severe foot deformity secondary to severe gout.  No results for input(s): GRAMSTAIN, LABORGA in the last 8760 hours.  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 - Excisionally dedbrided ulcerations x2 to healthy bleeding borders using a sterile chisel   blade. -Applied antibiotic cream and dry sterile dressing and instructed patient to continue with daily dressings at home consisting of the same using bactroban since he did not like PRISMA; will consider biologic if no better of EPIFIX next visit. - Advised patient to go to the ER or return to office if the wound worsens or if constitutional symptoms are present. -Continue with custom shoes -Patient to return to office in 2-3 weeks for follow up care and evaluation or sooner if problems arise.  Landis Martins, DPM

## 2017-06-17 ENCOUNTER — Encounter (INDEPENDENT_AMBULATORY_CARE_PROVIDER_SITE_OTHER): Payer: Self-pay

## 2017-06-17 ENCOUNTER — Ambulatory Visit (INDEPENDENT_AMBULATORY_CARE_PROVIDER_SITE_OTHER): Payer: Medicare Other | Admitting: Sports Medicine

## 2017-06-17 ENCOUNTER — Telehealth: Payer: Self-pay | Admitting: *Deleted

## 2017-06-17 DIAGNOSIS — L97511 Non-pressure chronic ulcer of other part of right foot limited to breakdown of skin: Secondary | ICD-10-CM

## 2017-06-17 DIAGNOSIS — E11621 Type 2 diabetes mellitus with foot ulcer: Secondary | ICD-10-CM

## 2017-06-17 DIAGNOSIS — Z8739 Personal history of other diseases of the musculoskeletal system and connective tissue: Secondary | ICD-10-CM | POA: Diagnosis not present

## 2017-06-17 DIAGNOSIS — L97521 Non-pressure chronic ulcer of other part of left foot limited to breakdown of skin: Secondary | ICD-10-CM

## 2017-06-17 MED ORDER — MUPIROCIN 2 % EX OINT: TOPICAL_OINTMENT | CUTANEOUS | 1 refills | Status: DC

## 2017-06-17 NOTE — Telephone Encounter (Addendum)
-----   Message from Landis Martins, [place] sent at 06/17/2017  9:49 AM EDT ----- Regarding: Epifix grafts Diabetic with foot ulcerations  Right heel ulceration 1x2.5x0.2cm  Right midfoot ulceration 2.5x2.5x0.3cm Left plantar forefoot ulceration 0.5x0.5cmx0.2cm. Faxed required form, clinicals and demographics.06/18/2017-[place]. [place] - Scientist, clinical (histocompatibility and immunogenetics) reviewed [employer], states pt is to pay $353.31/JWWZL application.06/19/2017-Unable to inform pt of insurance coverage of EpiFix, voicemail box had not been set up. Left message requesting pt's brother, DHANVIN SZETO, have pt call our office.06/23/2017-Unable to leave a message voicemail box is not set up. I spoke with pt's brother, LINDA BIEHN and he states he gave pt my message and pt had understood to call.

## 2017-06-17 NOTE — Progress Notes (Signed)
Subjective: Thomas [name] is a 66 y.o. male patient seen in office for follow-up evaluation of ulcerations of the right and left foot. Patient has a history of diabetes and a blood glucose level today not recorded. Patient is changing the dressing using bactroban at home states that he just ran out. Denies nausea/fever/vomiting/chills/night sweats/shortness of breath/pain. Patient states that he is having swelling to both feet. However, the right seems a little more swollen than the left. Patient has no other pedal complaints at this time.  There are no active problems to display for this patient.  Current Outpatient Prescriptions on File Prior to Visit  Medication Sig Dispense Refill  . bethanechol (URECHOLINE) 50 MG tablet Take 50 mg by mouth.    . clindamycin (CLEOCIN) 300 MG capsule Take 1 capsule (300 mg total) by mouth 3 (three) times daily. (Patient not taking: Reported on 04/23/2017) 30 capsule 2  . cloNIDine (CATAPRES) 0.3 MG tablet Take 0.3 mg by mouth.    . colchicine 0.6 MG tablet Take 0.6 mg by mouth.    . furosemide (LASIX) 40 MG tablet Take 40 mg by mouth 2 (two) times daily.    Marland Kitchen gabapentin (NEURONTIN) 300 MG capsule     . glipiZIDE (GLUCOTROL XL) 10 MG 24 hr tablet     . Levothyroxine Sodium (SYNTHROID PO) Take by mouth.    . silver sulfADIAZINE (SILVADENE) 1 % cream Apply 1 application topically daily. (Patient not taking: Reported on 04/23/2017) 50 g 0  . tiZANidine (ZANAFLEX) 4 MG tablet      Current Facility-Administered Medications on File Prior to Visit  Medication Dose Route Frequency Provider Last Rate Last Dose  . triamcinolone acetonide (KENALOG) 10 MG/ML injection 10 mg  10 mg Other Once Landis Martins, DPM       Allergies  Allergen Reactions  . Oxycodone-Acetaminophen Other (See Comments)    Blood pressure issues  . Oxymorphone Other (See Comments)    Causes kidney problems  . Beta Vulgaris   . Buspirone   . Cabbage   . Codeine   . Fish-Derived Products    . Pentazocine   . Propoxyphene   . Shellfish-Derived Products   . Sulfa Antibiotics Hives    No results found for this or any previous visit (from the past 2160 hour(s)).  Objective: There were no vitals filed for this visit.  General: Patient is awake, alert, oriented x 3 and in no acute distress.  Dermatology: Skin is warm and dry bilateral with a Full thickness ulceration present right plantar midfoot. Ulceration measures 2.5 cm x 2.5 cm x 0.3cm larger than previous. There is a keratotic border with a 10:90 fibro-granular base. The ulceration does not probe to bone. There is no malodor, no active drainage, no erythema, no edema. There is a partial thickness ulceration present to the right posterior heel that measures 1 x 2.5 x 0.2 cm with a keratotic margin and a granular base. There is no malodor, no active drainage, no erythema, no acute surrounding edema. There is a partial thickness ulceration sub met 2 on left measures 0.5x0.5x0.2cm limited to skin with there is a keratotic border with a 100% granular base. The ulceration does not probe to bone. There is no malodor, no active drainage, no erythema, no edema. 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 No acute signs of infection.   Vascular: Dorsalis Pedis pulse = 1/4 Bilateral,  Posterior Tibial pulse =1 /4 Bilateral,  Capillary Fill Time < 5 seconds chronic swelling bilateral,  secondary to history of gout with foot deformity.  Neurologic: Protective sensation absent bilateral.  Musculosketal: There is no pain to palpation bilateral and at severe foot deformity secondary to severe gout.   No results for input(s): GRAMSTAIN, LABORGA in the last 8760 hours.  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 -Encouraged daily control of blood sugars and inspection of feet in the setting of diabetes  Excisionally dedbrided ulceration at right and left foot to healthy bleeding borders removing nonviable tissue using a sterile chisel blade. Wound measures post debridement as above. Wound was debrided to the level of the dermis with viable wound base exposed to promote healing. Hemostasis was achieved with manuel pressure. Patient tolerated procedure well without any discomfort or anesthesia necessary for this wound debridement.  -Applied antibiotic cream and dry sterile dressing and instructed patient to continue with daily dressings at home consisting of Bactroban as refill for patient. A request for Epifix was placed for patient, Since wounds have not improved over the last 7 weeks. - Advised patient to go to the ER or return to office if the wound worsens or if constitutional symptoms are present. -Continue with custom shoes -Patient to return to office in 2-3 weeks for follow up care/possible graft and evaluation or sooner if problems arise.  Landis Martins, DPM

## 2017-06-23 NOTE — Telephone Encounter (Signed)
Thank you very much, Ok I will let him know when he comes to office to see me and then let you know what he decides. Thanks again  Dr. Cannon Kettle

## 2017-07-01 ENCOUNTER — Ambulatory Visit (INDEPENDENT_AMBULATORY_CARE_PROVIDER_SITE_OTHER): Payer: Medicare Other | Admitting: Sports Medicine

## 2017-07-01 ENCOUNTER — Encounter (INDEPENDENT_AMBULATORY_CARE_PROVIDER_SITE_OTHER): Payer: Self-pay

## 2017-07-01 DIAGNOSIS — M79672 Pain in left foot: Secondary | ICD-10-CM

## 2017-07-01 DIAGNOSIS — M79671 Pain in right foot: Secondary | ICD-10-CM | POA: Diagnosis not present

## 2017-07-01 DIAGNOSIS — L97521 Non-pressure chronic ulcer of other part of left foot limited to breakdown of skin: Secondary | ICD-10-CM | POA: Diagnosis not present

## 2017-07-01 DIAGNOSIS — L97511 Non-pressure chronic ulcer of other part of right foot limited to breakdown of skin: Secondary | ICD-10-CM | POA: Diagnosis not present

## 2017-07-01 DIAGNOSIS — E11621 Type 2 diabetes mellitus with foot ulcer: Secondary | ICD-10-CM | POA: Diagnosis not present

## 2017-07-01 DIAGNOSIS — Z8739 Personal history of other diseases of the musculoskeletal system and connective tissue: Secondary | ICD-10-CM

## 2017-07-01 NOTE — Patient Instructions (Signed)

## 2017-07-01 NOTE — Progress Notes (Signed)
Subjective: Thomas [name] is a 66 y.o. male patient seen in office for follow-up evaluation of ulcerations of the right and left foot. Patient has a history of diabetes and a blood glucose level today not recorded. Patient is changing the dressing using bactroban at home states that he has been doing a lot more and experienced more swelling and bleeding on right. Denies nausea/fever/vomiting/chills/night sweats/shortness of breath/pain in calf. Patient has no other pedal complaints at this time.  There are no active problems to display for this patient.  Current Outpatient Prescriptions on File Prior to Visit  Medication Sig Dispense Refill  . bethanechol (URECHOLINE) 50 MG tablet Take 50 mg by mouth.    . clindamycin (CLEOCIN) 300 MG capsule Take 1 capsule (300 mg total) by mouth 3 (three) times daily. (Patient not taking: Reported on 04/23/2017) 30 capsule 2  . cloNIDine (CATAPRES) 0.3 MG tablet Take 0.3 mg by mouth.    . colchicine 0.6 MG tablet Take 0.6 mg by mouth.    . furosemide (LASIX) 40 MG tablet Take 40 mg by mouth 2 (two) times daily.    Marland Kitchen gabapentin (NEURONTIN) 300 MG capsule     . glipiZIDE (GLUCOTROL XL) 10 MG 24 hr tablet     . Levothyroxine Sodium (SYNTHROID PO) Take by mouth.    . mupirocin ointment (BACTROBAN) 2 % To foot ulcers on both feet 30 g 1  . silver sulfADIAZINE (SILVADENE) 1 % cream Apply 1 application topically daily. (Patient not taking: Reported on 04/23/2017) 50 g 0  . tiZANidine (ZANAFLEX) 4 MG tablet      Current Facility-Administered Medications on File Prior to Visit  Medication Dose Route Frequency Provider Last Rate Last Dose  . triamcinolone acetonide (KENALOG) 10 MG/ML injection 10 mg  10 mg Other Once Landis Martins, DPM       Allergies  Allergen Reactions  . Oxycodone-Acetaminophen Other (See Comments)    Blood pressure issues  . Oxymorphone Other (See Comments)    Causes kidney problems  . Beta Vulgaris   . Buspirone   . Cabbage   . Codeine    . Fish-Derived Products   . Pentazocine   . Propoxyphene   . Shellfish-Derived Products   . Sulfa Antibiotics Hives    No results found for this or any previous visit (from the past 2160 hour(s)).  Objective: There were no vitals filed for this visit.  General: Patient is awake, alert, oriented x 3 and in no acute distress.  Dermatology: Skin is warm and dry bilateral with a Full thickness ulceration present right plantar midfoot. Ulceration measures 2.5 cm x 2.5 cm x 0.4cm similar to previous. There is a keratotic border with a 10:90 fibro-granular base. The ulceration does not probe to bone. There is no malodor, no active drainage, no erythema, no edema. There is a partial thickness ulceration present to the right posterior heel that measures 1 x 2.5 x 0.2 cm with a keratotic margin and a granular base. There is no malodor, no active drainage, no erythema, no acute surrounding edema. There is a partial thickness ulceration sub met 2 on left measures 0.3x0.2x0.2cm limited to skin with there is a keratotic border with a 100% granular base. The ulceration does not probe to bone. There is no malodor, no active drainage, no erythema, no edema. 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 No acute signs of infection.   Vascular: Dorsalis Pedis pulse = 1/4 Bilateral,  Posterior Tibial pulse =1 /4 Bilateral,  Capillary  Fill Time < 5 seconds chronic swelling bilateral, secondary to history of gout with foot deformity.  Neurologic: Protective sensation absent bilateral.  Musculosketal: There is no pain to palpation bilateral and at severe foot deformity secondary to severe gout.   No results for input(s): GRAMSTAIN, LABORGA in the last 8760 hours.  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 -Encouraged daily control of blood sugars and inspection of feet in the setting of diabetes  -Excisionally dedbrided ulcerations at right and left foot to healthy bleeding borders removing nonviable tissue using a sterile chisel blade. Wound measures post debridement as above. Wounds were debrided to the level of the dermis with viable wound base exposed to promote healing. Hemostasis was achieved with manuel pressure. Patient tolerated procedure well without any discomfort or anesthesia necessary for this wound debridement.  -Applied antibiotic cream and dry sterile dressing and instructed patient to continue with daily dressings at home consisting of Bactroban until regranex has been received. Patient at this time can not afford Epifix and wants to try regranex - Advised patient to go to the ER or return to office if the wound worsens or if constitutional symptoms are present. -Continue with custom shoes -Patient to return to office in 2-3 weeks for follow up care and evaluation or sooner if problems arise.  Landis Martins, DPM

## 2017-07-08 ENCOUNTER — Telehealth: Payer: Self-pay | Admitting: *Deleted

## 2017-07-08 NOTE — Telephone Encounter (Signed)
"  I'm working on a prior authorization for [employer].  In order to resolve this request, we need additional information to decision the case.  I will fax this over to [phone].  If you have any questions you can call us back or fax back to [phone].

## 2017-07-17 ENCOUNTER — Telehealth: Payer: Self-pay | Admitting: Sports Medicine

## 2017-07-17 NOTE — Telephone Encounter (Signed)
This is Janine calling from [facility] pt support. Calling about prescription we received. The Regranex gel is a $0 co-pay for the pt. The pharmacy has made several attempts to reach the pt however the pt has not returned any of our calls. We were calling to see if you might have an alternative number or if you could reach the pt and have him call us. Our call back number is [phone] and its option 2 for pt support. Thank you.

## 2017-07-17 NOTE — Telephone Encounter (Signed)
Patient called the office and spoke with Malachy Mood who relayed the Regran[facility] message and phone number

## 2017-07-21 ENCOUNTER — Telehealth: Payer: Self-pay | Admitting: Sports Medicine

## 2017-07-21 NOTE — Telephone Encounter (Signed)
[facility] pharmacy called to say they were putting the order on hold because they have not been able to get in touch with the pt or the pt has not contacted them. Phone number is [phone].

## 2017-07-21 NOTE — Telephone Encounter (Signed)
Unable to speak with pt concerning Regranex, voicemail box has not been set up. Left message on pt's brother, [employer] phone requesting a call back from pt.

## 2017-07-22 NOTE — Telephone Encounter (Signed)
Patient came into office instead of returning the phone call.  Explained to him again that he needs to call the 800 number and speak to someone about the medication coverage.  We (myself and Malachy Mood) have spoken to him twice before about calling them back and provided him the number.

## 2017-07-24 ENCOUNTER — Ambulatory Visit (INDEPENDENT_AMBULATORY_CARE_PROVIDER_SITE_OTHER): Payer: Medicare Other | Admitting: Sports Medicine

## 2017-07-24 DIAGNOSIS — L97511 Non-pressure chronic ulcer of other part of right foot limited to breakdown of skin: Secondary | ICD-10-CM

## 2017-07-24 DIAGNOSIS — E11621 Type 2 diabetes mellitus with foot ulcer: Secondary | ICD-10-CM

## 2017-07-24 DIAGNOSIS — Z8739 Personal history of other diseases of the musculoskeletal system and connective tissue: Secondary | ICD-10-CM

## 2017-07-24 DIAGNOSIS — M79671 Pain in right foot: Secondary | ICD-10-CM

## 2017-07-24 NOTE — Addendum Note (Signed)
Addended by: Johnnye Lana A on: 07/24/2017 04:50 PM   Modules accepted: Orders

## 2017-07-24 NOTE — Progress Notes (Signed)
Subjective: Thomas [name] is a 66 y.o. male patient seen in office for follow-up evaluation of ulcerations of the right and left foot. Patient states that the left ulcer has healed now. Still dealing with issues on the right. States that he has noticed more drainage on the right. Patient has a history of diabetes and a blood glucose level today not recorded. Patient is changing the dressing using bactroban at home states that he has been noticing more swelling as well on both feet. The longer that he is up moving around. Denies nausea/fever/vomiting/chills/night sweats/shortness of breath/pain in calf. Patient has no other pedal complaints at this time.  There are no active problems to display for this patient.  Current Outpatient Prescriptions on File Prior to Visit  Medication Sig Dispense Refill  . bethanechol (URECHOLINE) 50 MG tablet Take 50 mg by mouth.    . clindamycin (CLEOCIN) 300 MG capsule Take 1 capsule (300 mg total) by mouth 3 (three) times daily. (Patient not taking: Reported on 04/23/2017) 30 capsule 2  . cloNIDine (CATAPRES) 0.3 MG tablet Take 0.3 mg by mouth.    . colchicine 0.6 MG tablet Take 0.6 mg by mouth.    . furosemide (LASIX) 40 MG tablet Take 40 mg by mouth 2 (two) times daily.    Marland Kitchen gabapentin (NEURONTIN) 300 MG capsule     . glipiZIDE (GLUCOTROL XL) 10 MG 24 hr tablet     . Levothyroxine Sodium (SYNTHROID PO) Take by mouth.    . mupirocin ointment (BACTROBAN) 2 % To foot ulcers on both feet 30 g 1  . silver sulfADIAZINE (SILVADENE) 1 % cream Apply 1 application topically daily. (Patient not taking: Reported on 04/23/2017) 50 g 0  . tiZANidine (ZANAFLEX) 4 MG tablet      Current Facility-Administered Medications on File Prior to Visit  Medication Dose Route Frequency Provider Last Rate Last Dose  . triamcinolone acetonide (KENALOG) 10 MG/ML injection 10 mg  10 mg Other Once Landis Martins, DPM       Allergies  Allergen Reactions  . Oxycodone-Acetaminophen Other (See  Comments)    Blood pressure issues  . Oxymorphone Other (See Comments)    Causes kidney problems  . Beta Vulgaris   . Buspirone   . Cabbage   . Codeine   . Fish-Derived Products   . Pentazocine   . Propoxyphene   . Shellfish-Derived Products   . Sulfa Antibiotics Hives    No results found for this or any previous visit (from the past 2160 hour(s)).  Objective: There were no vitals filed for this visit.  General: Patient is awake, alert, oriented x 3 and in no acute distress.  Dermatology: Skin is warm and dry bilateral with a Full thickness ulceration present right plantar midfoot. Ulceration measures 3 cm x 3.5 cm x 0.4cm larger than previous. There is a keratotic border with a 10:90 fibro-granular base. The ulceration does not probe to bone. There is no malodor, no active drainage, no erythema, no edema. There is a partial thickness ulceration present to the right posterior heel that measures 4 x 2.5 x 0.2 cm with a keratotic margin and a granular base. There is no malodor, no active drainage, no erythema, no acute surrounding edema. 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 No acute signs of infection.   Vascular: Dorsalis Pedis pulse = 1/4 Bilateral,  Posterior Tibial pulse =1 /4 Bilateral,  Capillary Fill Time < 5 seconds chronic swelling bilateral, secondary  to history of gout with foot deformity.  Neurologic: Protective sensation absent bilateral.  Musculosketal: There is no pain to palpation bilateral and at severe foot deformity secondary to severe gout.   No results for input(s): GRAMSTAIN, LABORGA in the last 8760 hours.  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 -Encouraged daily control of blood sugars and inspection of feet in the setting of diabetes  -Excisionally dedbrided ulcerations at right foot to healthy bleeding borders removing nonviable tissue using a sterile chisel blade. Wound measures post debridement as above. Wounds were debrided to the level of the dermis with viable wound base exposed to promote healing. Hemostasis was achieved with manuel pressure. Patient tolerated procedure well without any discomfort or anesthesia necessary for this wound debridement.  -Applied antibiotic cream and dry sterile dressing and instructed patient to continue with daily dressings at home consisting of Bactroban until regranex has been received.  -Will call patient if we need to start oral antibiotics based on wound culture that was obtained today and sent to [facility] - Advised patient to go to the ER or return to office if the wound worsens or if constitutional symptoms are present. -Continue with custom shoes. We'll plan to give patient a prescription for [facility] in [place] at next visit for a new set of custom shoes -Patient to return to office in 2 weeks for follow up care and evaluation or sooner if problems arise.  Landis Martins, DPM

## 2017-08-06 ENCOUNTER — Ambulatory Visit: Payer: Medicare Other | Admitting: Sports Medicine

## 2017-08-07 ENCOUNTER — Telehealth: Payer: Self-pay | Admitting: Sports Medicine

## 2017-08-07 NOTE — Telephone Encounter (Signed)
The medication that was ordered cannot be used until the patient gets proper fitting shoes which he is working on getting in the near future and the ointment that he is using has only about a week or two left.

## 2017-08-07 NOTE — Telephone Encounter (Signed)
Thomas [name] patient know that if he needs a refill he should call the pharmacy listed on the package/tube of the medication for a refill. -Dr. Cannon Kettle

## 2017-08-10 NOTE — Telephone Encounter (Signed)
Tried to call patient - voice mail is not set up.

## 2017-08-14 ENCOUNTER — Ambulatory Visit (INDEPENDENT_AMBULATORY_CARE_PROVIDER_SITE_OTHER): Payer: Medicare Other | Admitting: Sports Medicine

## 2017-08-14 DIAGNOSIS — Z8739 Personal history of other diseases of the musculoskeletal system and connective tissue: Secondary | ICD-10-CM

## 2017-08-14 DIAGNOSIS — L97512 Non-pressure chronic ulcer of other part of right foot with fat layer exposed: Secondary | ICD-10-CM | POA: Diagnosis not present

## 2017-08-14 DIAGNOSIS — S90821A Blister (nonthermal), right foot, initial encounter: Secondary | ICD-10-CM

## 2017-08-14 DIAGNOSIS — L089 Local infection of the skin and subcutaneous tissue, unspecified: Secondary | ICD-10-CM

## 2017-08-14 DIAGNOSIS — M1000 Idiopathic gout, unspecified site: Secondary | ICD-10-CM

## 2017-08-14 DIAGNOSIS — E1142 Type 2 diabetes mellitus with diabetic polyneuropathy: Secondary | ICD-10-CM

## 2017-08-14 MED ORDER — AMOXICILLIN-POT CLAVULANATE 875-125 MG PO TABS: 1.0000 | ORAL_TABLET | Freq: Two times a day (BID) | ORAL | 0 refills | Status: DC

## 2017-08-14 MED ORDER — MUPIROCIN 2 % EX OINT: TOPICAL_OINTMENT | CUTANEOUS | 1 refills | Status: DC

## 2017-08-14 MED ORDER — CLINDAMYCIN HCL 300 MG PO CAPS: 300.0000 mg | ORAL_CAPSULE | Freq: Three times a day (TID) | ORAL | 0 refills | Status: DC

## 2017-08-14 NOTE — Progress Notes (Signed)
Subjective: Thomas [name] is a 66 y.o. diabetic male patient seen in office for follow-up evaluation of ulcerations of the right and left foot. Patient states that his left ulcer continues to be all healed up. However, has more pain and swelling on the right. Patient reports that he finally got the Regranex cream, but has not used it because he feels like his shoes have worn out and they will not provide any additional offloading and taking pressure off the area so he just has been using antibiotic cream. Patient states that as his day progresses. He has been noticing more swelling the more that he is on his feet. Patient denies nausea, vomiting, fever, chills, night sweats, shortness of breath for pain in calf. Patient has no other pedal complaints at this time.  Fasting blood sugar not recorded  There are no active problems to display for this patient.  Current Outpatient Prescriptions on File Prior to Visit  Medication Sig Dispense Refill  . bethanechol (URECHOLINE) 50 MG tablet Take 50 mg by mouth.    . cloNIDine (CATAPRES) 0.3 MG tablet Take 0.3 mg by mouth.    . colchicine 0.6 MG tablet Take 0.6 mg by mouth.    . furosemide (LASIX) 40 MG tablet Take 40 mg by mouth 2 (two) times daily.    Marland Kitchen gabapentin (NEURONTIN) 300 MG capsule     . glipiZIDE (GLUCOTROL XL) 10 MG 24 hr tablet     . Levothyroxine Sodium (SYNTHROID PO) Take by mouth.    . silver sulfADIAZINE (SILVADENE) 1 % cream Apply 1 application topically daily. (Patient not taking: Reported on 04/23/2017) 50 g 0  . tiZANidine (ZANAFLEX) 4 MG tablet      Current Facility-Administered Medications on File Prior to Visit  Medication Dose Route Frequency Provider Last Rate Last Dose  . triamcinolone acetonide (KENALOG) 10 MG/ML injection 10 mg  10 mg Other Once Landis Martins, DPM       Allergies  Allergen Reactions  . Oxycodone-Acetaminophen Other (See Comments)    Blood pressure issues  . Oxymorphone Other (See Comments)    Causes  kidney problems  . Beta Vulgaris   . Buspirone   . Cabbage   . Codeine   . Fish-Derived Products   . Pentazocine   . Propoxyphene   . Shellfish-Derived Products   . Sulfa Antibiotics Hives    No results found for this or any previous visit (from the past 2160 hour(s)).  Objective: There were no vitals filed for this visit.  General: Patient is awake, alert, oriented x 3 and in no acute distress.  Dermatology: Skin is warm and dry bilateral with a Full thickness ulceration present right plantar midfoot. Ulceration measures 4 x 4 x 0.4cm larger than previous. There is a keratotic border with a 10:90 fibro-granular base. The ulceration does not probe to bone however adjacent to the ulceration. There is a blister with seropurulent drainage that measures 1 x 1 cm. There is no malodor,  no erythema, no edema. There is a partial thickness ulceration present to the right posterior heel that measures 3.5 x 2.5 x 0.2 cm with a keratotic margin and a granular base. There is no malodor, no active drainage, no erythema, no acute surrounding edema. 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 No acute signs of infection to these areas.   Vascular: Dorsalis Pedis pulse = 1/4 Bilateral,  Posterior Tibial pulse =1 /4 Bilateral,  Capillary Fill Time < 5  seconds chronic swelling bilateral, secondary to history of gout with foot deformity.  Neurologic: Protective sensation absent bilateral.  Musculosketal: There is mild pain to new blister area on the right foot. There is no pain at area of severe foot deformities secondary to severe gout.   No results for input(s): GRAMSTAIN, LABORGA in the last 8760 hours.  Assessment and Plan:  Problem List Items Addressed This Visit    None    Visit Diagnoses    Foot ulcer, right, with fat layer exposed ([place])    -  Primary   Relevant Medications   amoxicillin-clavulanate (AUGMENTIN) 875-125 MG tablet   clindamycin  (CLEOCIN) 300 MG capsule   Infected blister of right foot, initial encounter       Relevant Medications   clindamycin (CLEOCIN) 300 MG capsule   mupirocin ointment (BACTROBAN) 2 %   Acute idiopathic gout, unspecified site       Relevant Orders   Basic Metabolic Panel   CBC with Differential   Glucose 6 phosphate dehydrogenase   Uric Acid   Personal history of gout       Relevant Medications   mupirocin ointment (BACTROBAN) 2 %   Diabetic polyneuropathy associated with type 2 diabetes mellitus ([place])         -Examined patient and discussed the progression of the wound and treatment alternativesFor gout. -Encouraged daily control of blood sugars and inspection of feet in the setting of diabetes  -Excisionally dedbrided ulcerations and blister at right foot to healthy bleeding borders removing nonviable tissue using a sterile chisel blade. Wound measures post debridement as above. Wounds were debrided to the level of the dermis with viable wound base exposed to promote healing. Hemostasis was achieved with manuel pressure. Patient tolerated procedure well without any discomfort or anesthesia necessary for this wound debridement.  -Applied antibiotic cream and dry sterile dressing and instructed patient to continue with daily dressings at home consisting of Bactroban -Will hold off on Regranex cream until patient has gotten new custom shoes. Patient gave me information from [employer] and 2 big feet. I reviewed this information and Patient at this time will not benefit from these shoes because requires custom made shoes; will prescribe shoes from [facility]. -Wound culture results reviewed. Started patient on clindamycin and Augmentin for bacteria, positive for Clostridium and enterococcus - Advised patient to go to the ER or return to office if the wound worsens or if constitutional symptoms are present. -A fax was also sent to his nephrologist, for his opinion on patient being able to  start [facility] for gout. I also ordered blood work for review and will send this information to the medical rep Marylou Mccoy to assist me with ordering the medication once the patient has had completed, all blood work. -Patient to return to office in 2 weeks for follow up care and evaluation or sooner if problems arise.  Landis Martins, DPM

## 2017-08-19 ENCOUNTER — Encounter: Payer: Self-pay | Admitting: Sports Medicine

## 2017-08-28 ENCOUNTER — Ambulatory Visit (INDEPENDENT_AMBULATORY_CARE_PROVIDER_SITE_OTHER): Payer: Medicare Other | Admitting: Sports Medicine

## 2017-08-28 ENCOUNTER — Encounter: Payer: Self-pay | Admitting: Sports Medicine

## 2017-08-28 DIAGNOSIS — E1142 Type 2 diabetes mellitus with diabetic polyneuropathy: Secondary | ICD-10-CM

## 2017-08-28 DIAGNOSIS — M1000 Idiopathic gout, unspecified site: Secondary | ICD-10-CM

## 2017-08-28 DIAGNOSIS — M79671 Pain in right foot: Secondary | ICD-10-CM

## 2017-08-28 DIAGNOSIS — L97512 Non-pressure chronic ulcer of other part of right foot with fat layer exposed: Secondary | ICD-10-CM

## 2017-08-28 DIAGNOSIS — Z8739 Personal history of other diseases of the musculoskeletal system and connective tissue: Secondary | ICD-10-CM

## 2017-08-28 MED ORDER — MUPIROCIN 2 % EX OINT: TOPICAL_OINTMENT | CUTANEOUS | 1 refills | Status: DC

## 2017-08-28 NOTE — Progress Notes (Signed)
Subjective: Thomas [name] is a 66 y.o. diabetic male patient seen in office for follow-up evaluation of ulcerations of the righ foot. Patient states that he had his blood work were performed at the hospital and is here to also discuss the results for [place] to come up with a plan for his infusions for gout. Patient denies nausea, vomiting, fever, chills, night sweats, shortness of breath for pain in calf. Patient has no other pedal complaints at this time.  Fasting blood sugar not recorded  There are no active problems to display for this patient.  Current Outpatient Medications on File Prior to Visit  Medication Sig Dispense Refill  . amoxicillin-clavulanate (AUGMENTIN) 875-125 MG tablet Take 1 tablet by mouth 2 (two) times daily. 28 tablet 0  . bethanechol (URECHOLINE) 50 MG tablet Take 50 mg by mouth.    . clindamycin (CLEOCIN) 300 MG capsule Take 1 capsule (300 mg total) by mouth 3 (three) times daily. 30 capsule 0  . cloNIDine (CATAPRES) 0.3 MG tablet Take 0.3 mg by mouth.    . colchicine 0.6 MG tablet Take 0.6 mg by mouth.    . furosemide (LASIX) 40 MG tablet Take 40 mg by mouth 2 (two) times daily.    Marland Kitchen gabapentin (NEURONTIN) 300 MG capsule     . glipiZIDE (GLUCOTROL XL) 10 MG 24 hr tablet     . Levothyroxine Sodium (SYNTHROID PO) Take by mouth.    . silver sulfADIAZINE (SILVADENE) 1 % cream Apply 1 application topically daily. (Patient not taking: Reported on 04/23/2017) 50 g 0  . tiZANidine (ZANAFLEX) 4 MG tablet      Current Facility-Administered Medications on File Prior to Visit  Medication Dose Route Frequency Provider Last Rate Last Dose  . triamcinolone acetonide (KENALOG) 10 MG/ML injection 10 mg  10 mg Other Once Landis Martins, DPM       Allergies  Allergen Reactions  . Oxycodone-Acetaminophen Other (See Comments)    Blood pressure issues  . Oxymorphone Other (See Comments)    Causes kidney problems  . Beta Vulgaris   . Buspirone   . Cabbage   . Codeine   .  Fish-Derived Products   . Pentazocine   . Propoxyphene   . Shellfish-Derived Products   . Sulfa Antibiotics Hives    No results found for this or any previous visit (from the past 2160 hour(s)).  Objective: There were no vitals filed for this visit.  General: Patient is awake, alert, oriented x 3 and in no acute distress.  Dermatology: Skin is warm and dry bilateral with a Full thickness ulceration present right plantar midfoot. Ulceration measures 4 x 4 x 0.4cm same as previous. There is a keratotic border with a 10:90 fibro-granular base. The ulceration does not probe to bone however there is a adjacent ulceration that measures 2 x 1 cm with 100% granular base and keratotic margins. There is no malodor,  no erythema, no edema.   There is a scabbed over and healed ulceration at the right posterior heel, no malodor, no active drainage, no erythema, no acute surrounding edema.   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 No acute signs of infection to these areas.   Vascular: Dorsalis Pedis pulse = 1/4 Bilateral,  Posterior Tibial pulse =1 /4 Bilateral,  Capillary Fill Time < 5 seconds chronic swelling bilateral, secondary to history of gout with foot deformity.  Neurologic: Protective sensation absent bilateral.  Musculosketal: There is no pain at area  of severe foot deformities secondary to severe gout.   No results for input(s): GRAMSTAIN, LABORGA in the last 8760 hours.  Assessment and Plan:  Problem List Items Addressed This Visit    None    Visit Diagnoses    Foot ulcer, right, with fat layer exposed ([place])    -  Primary   Personal history of gout       Relevant Medications   mupirocin ointment (BACTROBAN) 2 %   Diabetic polyneuropathy associated with type 2 diabetes mellitus ([facility])       Acute idiopathic gout, unspecified site       Foot pain, right         -Examined patient and discussed the progression of the wound and  treatment alternatives, For gout. -Encouraged daily control of blood sugars and inspection of feet in the setting of diabetes  -Excisionally dedbrided ulcerations at right foot to healthy bleeding borders removing nonviable tissue using a sterile chisel blade. Wound measures post debridement as above. Wounds were debrided to the level of the dermis with viable wound base exposed to promote healing. Hemostasis was achieved with manuel pressure. Patient tolerated procedure well without any discomfort or anesthesia necessary for this wound debridement.  -Applied antibiotic cream and dry sterile dressing and instructed patient to continue with daily dressings at home consisting of Bactroban, refill provided -Will hold off on custom shoes at this time until patient has started [facility] for gout because likely his feet will change in structure as the gout crystals are being resolved; advised patient that he will need custom shoes from [employer] after he has started treatment with Krystexxa. -Continue with antibiotics until completed - Advised patient to go to the ER or return to office if the wound worsens or if constitutional symptoms are present. -Faxed benefit verification to [employer] for [place]. -Patient to return to office in 2 weeks for follow up care and evaluation or sooner if problems arise.  Landis Martins, DPM

## 2017-09-18 ENCOUNTER — Encounter: Payer: Self-pay | Admitting: Sports Medicine

## 2017-09-18 ENCOUNTER — Ambulatory Visit (INDEPENDENT_AMBULATORY_CARE_PROVIDER_SITE_OTHER): Payer: Medicare Other | Admitting: Sports Medicine

## 2017-09-18 DIAGNOSIS — L97511 Non-pressure chronic ulcer of other part of right foot limited to breakdown of skin: Secondary | ICD-10-CM | POA: Diagnosis not present

## 2017-09-18 DIAGNOSIS — E1142 Type 2 diabetes mellitus with diabetic polyneuropathy: Secondary | ICD-10-CM

## 2017-09-18 DIAGNOSIS — E11621 Type 2 diabetes mellitus with foot ulcer: Secondary | ICD-10-CM

## 2017-09-18 DIAGNOSIS — M79671 Pain in right foot: Secondary | ICD-10-CM

## 2017-09-18 DIAGNOSIS — Z8739 Personal history of other diseases of the musculoskeletal system and connective tissue: Secondary | ICD-10-CM

## 2017-09-18 DIAGNOSIS — L97512 Non-pressure chronic ulcer of other part of right foot with fat layer exposed: Secondary | ICD-10-CM

## 2017-09-18 NOTE — Patient Instructions (Signed)

## 2017-09-18 NOTE — Progress Notes (Signed)
Subjective: Thomas [name] is a 66 y.o. diabetic male patient seen in office for follow-up evaluation of ulcerations of the right foot. Patient states that he still is waiting on his infusions for gout, reports that he talked with the nurse from the company. Patient denies nausea, vomiting, fever, chills, night sweats, shortness of breath for pain in calf. Patient has no other pedal complaints at this time.  Fasting blood sugar not recorded  There are no active problems to display for this patient.  Current Outpatient Medications on File Prior to Visit  Medication Sig Dispense Refill  . amoxicillin-clavulanate (AUGMENTIN) 875-125 MG tablet Take 1 tablet by mouth 2 (two) times daily. 28 tablet 0  . bethanechol (URECHOLINE) 50 MG tablet Take 50 mg by mouth.    . clindamycin (CLEOCIN) 300 MG capsule Take 1 capsule (300 mg total) by mouth 3 (three) times daily. 30 capsule 0  . cloNIDine (CATAPRES) 0.3 MG tablet Take 0.3 mg by mouth.    . colchicine 0.6 MG tablet Take 0.6 mg by mouth.    . furosemide (LASIX) 40 MG tablet Take 40 mg by mouth 2 (two) times daily.    Marland Kitchen gabapentin (NEURONTIN) 300 MG capsule     . glipiZIDE (GLUCOTROL XL) 10 MG 24 hr tablet     . Levothyroxine Sodium (SYNTHROID PO) Take by mouth.    . mupirocin ointment (BACTROBAN) 2 % To Right foot ulcers 30 g 1  . silver sulfADIAZINE (SILVADENE) 1 % cream Apply 1 application topically daily. (Patient not taking: Reported on 04/23/2017) 50 g 0  . tiZANidine (ZANAFLEX) 4 MG tablet      Current Facility-Administered Medications on File Prior to Visit  Medication Dose Route Frequency Provider Last Rate Last Dose  . triamcinolone acetonide (KENALOG) 10 MG/ML injection 10 mg  10 mg Other Once Landis Martins, DPM       Allergies  Allergen Reactions  . Oxycodone-Acetaminophen Other (See Comments)    Blood pressure issues  . Oxymorphone Other (See Comments)    Causes kidney problems  . Beta Vulgaris   . Buspirone   . Cabbage   .  Codeine   . Fish-Derived Products   . Pentazocine   . Propoxyphene   . Shellfish-Derived Products   . Sulfa Antibiotics Hives    No results found for this or any previous visit (from the past 2160 hour(s)).  Objective: There were no vitals filed for this visit.  General: Patient is awake, alert, oriented x 3 and in no acute distress.  Dermatology: Skin is warm and dry bilateral with a Full thickness ulceration present right plantar midfoot. Ulceration measures 4 x 4 x 0.4cm same as previous. There is a keratotic border with a 10:90 fibro-granular base. The ulceration does not probe to bone however there is a adjacent ulceration that measures 2 x 1 cm same as previous with 100% granular base and keratotic margins. There is no malodor,  no erythema, no edema.   There is a scabbed over and fissured ulceration at the right posterior heel, no malodor, no active drainage, no erythema, no acute surrounding edema.   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 No acute signs of infection to these areas.   Vascular: Dorsalis Pedis pulse = 1/4 Bilateral,  Posterior Tibial pulse =1 /4 Bilateral,  Capillary Fill Time < 5 seconds chronic swelling bilateral, secondary to history of gout with foot deformity.  Neurologic: Protective sensation absent bilateral.  Musculosketal: There  is no pain at area of severe foot deformities secondary to severe gout.   No results for input(s): GRAMSTAIN, LABORGA in the last 8760 hours.  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 -Encouraged daily control of blood sugars and inspection of feet in the setting of diabetes  -Excisionally dedbrided ulcerations at right foot to healthy bleeding borders removing nonviable tissue using a sterile chisel blade. Wound measures post debridement as above. Wounds were debrided to the level of the dermis with viable wound base exposed to promote healing. Hemostasis was achieved with manuel pressure. Patient tolerated procedure well without any discomfort or anesthesia necessary for this wound debridement.  -Applied antibiotic cream and dry sterile dressing and instructed patient to continue with daily dressings at home consisting of Bactroban - Advised patient to go to the ER or return to office if the wound worsens or if constitutional symptoms are present. -Patient is awaiting approval at [facility] to start Krystexxa infusions for gout. -Patient to return to office in 2 weeks for follow up care and evaluation or sooner if problems arise.  Landis Martins, DPM

## 2017-09-30 ENCOUNTER — Ambulatory Visit (INDEPENDENT_AMBULATORY_CARE_PROVIDER_SITE_OTHER): Payer: Medicare Other | Admitting: Sports Medicine

## 2017-09-30 ENCOUNTER — Encounter: Payer: Self-pay | Admitting: Sports Medicine

## 2017-09-30 DIAGNOSIS — M79671 Pain in right foot: Secondary | ICD-10-CM

## 2017-09-30 DIAGNOSIS — Z8739 Personal history of other diseases of the musculoskeletal system and connective tissue: Secondary | ICD-10-CM | POA: Diagnosis not present

## 2017-09-30 DIAGNOSIS — L97512 Non-pressure chronic ulcer of other part of right foot with fat layer exposed: Secondary | ICD-10-CM

## 2017-09-30 DIAGNOSIS — E1142 Type 2 diabetes mellitus with diabetic polyneuropathy: Secondary | ICD-10-CM

## 2017-09-30 MED ORDER — MUPIROCIN 2 % EX OINT: TOPICAL_OINTMENT | CUTANEOUS | 1 refills | Status: DC

## 2017-09-30 NOTE — Progress Notes (Signed)
Subjective: Thomas [name] is a 66 y.o. diabetic male patient seen in office for follow-up evaluation of ulcerations of the right foot. Patient states that he still is waiting on his infusions for gout, reports that he has a meeting with the insurance company on tomorrow to discuss additional coverage to help cover the cost of his infusions. Patient denies nausea, vomiting, fever, chills, night sweats, shortness of breath for pain in calf. Patient has no other pedal complaints at this time.  Fasting blood sugar not recorded  There are no active problems to display for this patient.  Current Outpatient Medications on File Prior to Visit  Medication Sig Dispense Refill  . amoxicillin-clavulanate (AUGMENTIN) 875-125 MG tablet Take 1 tablet by mouth 2 (two) times daily. 28 tablet 0  . bethanechol (URECHOLINE) 50 MG tablet Take 50 mg by mouth.    . clindamycin (CLEOCIN) 300 MG capsule Take 1 capsule (300 mg total) by mouth 3 (three) times daily. 30 capsule 0  . cloNIDine (CATAPRES) 0.3 MG tablet Take 0.3 mg by mouth.    . colchicine 0.6 MG tablet Take 0.6 mg by mouth.    . furosemide (LASIX) 40 MG tablet Take 40 mg by mouth 2 (two) times daily.    Marland Kitchen gabapentin (NEURONTIN) 300 MG capsule     . glipiZIDE (GLUCOTROL XL) 10 MG 24 hr tablet     . Levothyroxine Sodium (SYNTHROID PO) Take by mouth.    . silver sulfADIAZINE (SILVADENE) 1 % cream Apply 1 application topically daily. (Patient not taking: Reported on 04/23/2017) 50 g 0  . tiZANidine (ZANAFLEX) 4 MG tablet      Current Facility-Administered Medications on File Prior to Visit  Medication Dose Route Frequency Provider Last Rate Last Dose  . triamcinolone acetonide (KENALOG) 10 MG/ML injection 10 mg  10 mg Other Once Landis Martins, DPM       Allergies  Allergen Reactions  . Oxycodone-Acetaminophen Other (See Comments)    Blood pressure issues  . Oxymorphone Other (See Comments)    Causes kidney problems  . Beta Vulgaris   . Buspirone    . Cabbage   . Codeine   . Fish-Derived Products   . Pentazocine   . Propoxyphene   . Shellfish-Derived Products   . Sulfa Antibiotics Hives    No results found for this or any previous visit (from the past 2160 hour(s)).  Objective: There were no vitals filed for this visit.  General: Patient is awake, alert, oriented x 3 and in no acute distress.  Dermatology: Skin is warm and dry bilateral with a Full thickness ulceration present right plantar midfoot. Ulceration measures 4 x 4 x 0.4cm same as previous. There is a keratotic border with a 10:90 fibro-granular base. The ulceration does not probe to bone however there is a adjacent ulceration that measures 1.5 x 1 cm smaller than previous with 100% granular base and keratotic margins. There is no malodor,  no erythema, no edema.   There is a scabbed over and fissured ulceration at the right posterior heel, no malodor, no active drainage, no erythema, no acute surrounding edema.   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 No acute signs of infection to these areas.   Vascular: Dorsalis Pedis pulse = 1/4 Bilateral,  Posterior Tibial pulse =1 /4 Bilateral,  Capillary Fill Time < 5 seconds chronic swelling bilateral, secondary to history of gout with foot deformity.  Neurologic: Protective sensation absent bilateral.  Musculosketal: There  is no pain at area of severe foot deformities secondary to severe gout.   No results for input(s): GRAMSTAIN, LABORGA in the last 8760 hours.  Assessment and Plan:  Problem List Items Addressed This Visit    None    Visit Diagnoses    Foot ulcer, right, with fat layer exposed ([place])    -  Primary   Relevant Medications   mupirocin ointment (BACTROBAN) 2 %   Personal history of gout       Diabetic polyneuropathy associated with type 2 diabetes mellitus ([facility])       Foot pain, right         -Examined patient and Re-discussed the progression of the wound  and treatment alternatives, For gout in attempt to help heel ulcers. -Encouraged daily control of blood sugars and inspection of feet in the setting of diabetes  -Excisionally dedbrided ulcerations at right foot to healthy bleeding borders removing nonviable tissue using a sterile chisel blade. Wound measures post debridement as above. Wounds were debrided to the level of the dermis with viable wound base exposed to promote healing. Hemostasis was achieved with manuel pressure. Patient tolerated procedure well without any discomfort or anesthesia necessary for this wound debridement.  -Applied antibiotic cream and dry sterile dressing and instructed patient to continue with daily dressings at home consisting of Bactroban - Advised patient to go to the ER or return to office if the wound worsens or if constitutional symptoms are present. -Patient is awaiting to start Krystexxa infusions for gout. -Patient to return to office in 2-3 weeks for follow up care and evaluation or sooner if problems arise.  Landis Martins, DPM

## 2017-10-02 ENCOUNTER — Telehealth: Payer: Self-pay | Admitting: *Deleted

## 2017-10-02 NOTE — Telephone Encounter (Addendum)
Faxed required form faxed to our office 09/22/2017, clinicals and demographics to [place] to be completed by pt and Dr. Cannon Kettle, then faxed by assistant to [facility].

## 2017-10-21 ENCOUNTER — Ambulatory Visit (INDEPENDENT_AMBULATORY_CARE_PROVIDER_SITE_OTHER): Payer: Medicare Other | Admitting: Sports Medicine

## 2017-10-21 ENCOUNTER — Encounter: Payer: Self-pay | Admitting: Sports Medicine

## 2017-10-21 DIAGNOSIS — L97512 Non-pressure chronic ulcer of other part of right foot with fat layer exposed: Secondary | ICD-10-CM

## 2017-10-21 DIAGNOSIS — Z9189 Other specified personal risk factors, not elsewhere classified: Secondary | ICD-10-CM

## 2017-10-21 DIAGNOSIS — E1142 Type 2 diabetes mellitus with diabetic polyneuropathy: Secondary | ICD-10-CM

## 2017-10-21 DIAGNOSIS — Z8739 Personal history of other diseases of the musculoskeletal system and connective tissue: Secondary | ICD-10-CM

## 2017-10-21 MED ORDER — DOXYCYCLINE HYCLATE 100 MG PO TABS: 100.0000 mg | ORAL_TABLET | Freq: Two times a day (BID) | ORAL | 0 refills | Status: DC

## 2017-10-21 NOTE — Progress Notes (Signed)
Subjective: Thomas [name] is a 67 y.o. diabetic male patient seen in office for follow-up evaluation of ulceration of the right foot. Patient states that he was bitten by a wolfe spider on right 2nd toe and was given Augmentin for cellulitis that he can not take due to blistering reaction on his upper body, reports he had swelling and drainage and was diagnosed with cellulitis. Patient denies nausea, vomiting, fever, chills, night sweats, shortness of breath for pain in calf. Patient has no other pedal complaints at this time.  Fasting blood sugar not recorded  There are no active problems to display for this patient.  Current Outpatient Medications on File Prior to Visit  Medication Sig Dispense Refill  . amoxicillin-clavulanate (AUGMENTIN) 875-125 MG tablet Take 1 tablet by mouth 2 (two) times daily. 28 tablet 0  . bethanechol (URECHOLINE) 50 MG tablet Take 50 mg by mouth.    . clindamycin (CLEOCIN) 300 MG capsule Take 1 capsule (300 mg total) by mouth 3 (three) times daily. 30 capsule 0  . cloNIDine (CATAPRES) 0.3 MG tablet Take 0.3 mg by mouth.    . colchicine 0.6 MG tablet Take 0.6 mg by mouth.    . furosemide (LASIX) 40 MG tablet Take 40 mg by mouth 2 (two) times daily.    Marland Kitchen gabapentin (NEURONTIN) 300 MG capsule     . glipiZIDE (GLUCOTROL XL) 10 MG 24 hr tablet     . Levothyroxine Sodium (SYNTHROID PO) Take by mouth.    . mupirocin ointment (BACTROBAN) 2 % To Right foot ulcers 30 g 1  . silver sulfADIAZINE (SILVADENE) 1 % cream Apply 1 application topically daily. (Patient not taking: Reported on 04/23/2017) 50 g 0  . tiZANidine (ZANAFLEX) 4 MG tablet      Current Facility-Administered Medications on File Prior to Visit  Medication Dose Route Frequency Provider Last Rate Last Dose  . triamcinolone acetonide (KENALOG) 10 MG/ML injection 10 mg  10 mg Other Once Landis Martins, DPM       Allergies  Allergen Reactions  . Oxycodone-Acetaminophen Other (See Comments)    Blood pressure  issues  . Oxymorphone Other (See Comments)    Causes kidney problems  . Beta Vulgaris   . Buspirone   . Cabbage   . Codeine   . Fish-Derived Products   . Pentazocine   . Propoxyphene   . Shellfish-Derived Products   . Sulfa Antibiotics Hives    No results found for this or any previous visit (from the past 2160 hour(s)).  Objective: There were no vitals filed for this visit.  General: Patient is awake, alert, oriented x 3 and in no acute distress.  Dermatology: Skin is warm and dry bilateral with a Full thickness ulceration present right plantar midfoot. Ulceration measures 4.5 x 6 x 0.4cm larger than previous. There is a keratotic border with a 100% granular base. The ulceration does not probe to bone however there are keratotic margins. There is no malodor,  no erythema, no edema.   There is a scabbed over and fissured ulceration at the right posterior heel, no malodor, no active drainage, no erythema, no acute surrounding edema.   Abrasion to right 4th toe at area of gouty tophi pressing against skin.   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 No acute signs of infection to these areas. No cellulitis.    Vascular: Dorsalis Pedis pulse = 1/4 Bilateral,  Posterior Tibial pulse =1 /4 Bilateral,  Capillary Fill Time <  5 seconds chronic swelling bilateral, secondary to history of gout with foot deformity.  Neurologic: Protective sensation absent bilateral.  Musculosketal: There is no pain at area of severe foot deformities secondary to severe gout.   No results for input(s): GRAMSTAIN, LABORGA in the last 8760 hours.  Assessment and Plan:  Problem List Items Addressed This Visit    None    Visit Diagnoses    Foot ulcer, right, with fat layer exposed ([place])    -  Primary   Personal history of gout       History of venomous spider bite       Diabetic polyneuropathy associated with type 2 diabetes mellitus ([place])           -Examined patient and Re-discussed the progression of the wound and treatment alternatives -Encouraged daily control of blood sugars and inspection of feet in the setting of diabetes  -Excisionally dedbrided ulceration at right midfoot to healthy bleeding borders removing nonviable tissue using a sterile chisel blade. Wound measures post debridement as above. Wounds were debrided to the level of the dermis with viable wound base exposed to promote healing. Hemostasis was achieved with manuel pressure. Patient tolerated procedure well without any discomfort or anesthesia necessary for this wound debridement.  -Applied antibiotic cream and dry sterile dressing and instructed patient to continue with daily dressings at home consisting of Bactroban -Rx Doxycycline to take instead of Augmentin for previous cellulitis secondary to spider bite  -Advised patient to go to the ER or return to office if the wound worsens or if constitutional symptoms are present. -Patient is awaiting to start Krystexxa infusions for gout; Secondary insurance coverage is pending  -Patient to return to office in 2-3 weeks for follow up care and evaluation or sooner if problems arise.  Landis Martins, DPM

## 2017-10-25 DIAGNOSIS — R0602 Shortness of breath: Secondary | ICD-10-CM | POA: Diagnosis not present

## 2017-10-25 DIAGNOSIS — N485 Ulcer of penis: Secondary | ICD-10-CM | POA: Diagnosis not present

## 2017-10-25 DIAGNOSIS — E1122 Type 2 diabetes mellitus with diabetic chronic kidney disease: Secondary | ICD-10-CM | POA: Diagnosis not present

## 2017-10-25 DIAGNOSIS — I509 Heart failure, unspecified: Secondary | ICD-10-CM | POA: Diagnosis not present

## 2017-10-25 DIAGNOSIS — N185 Chronic kidney disease, stage 5: Secondary | ICD-10-CM | POA: Diagnosis not present

## 2017-10-25 DIAGNOSIS — I132 Hypertensive heart and chronic kidney disease with heart failure and with stage 5 chronic kidney disease, or end stage renal disease: Secondary | ICD-10-CM | POA: Diagnosis not present

## 2017-10-26 DIAGNOSIS — E1122 Type 2 diabetes mellitus with diabetic chronic kidney disease: Secondary | ICD-10-CM | POA: Diagnosis not present

## 2017-10-26 DIAGNOSIS — I132 Hypertensive heart and chronic kidney disease with heart failure and with stage 5 chronic kidney disease, or end stage renal disease: Secondary | ICD-10-CM | POA: Diagnosis not present

## 2017-10-26 DIAGNOSIS — I509 Heart failure, unspecified: Secondary | ICD-10-CM | POA: Diagnosis not present

## 2017-10-26 DIAGNOSIS — N185 Chronic kidney disease, stage 5: Secondary | ICD-10-CM | POA: Diagnosis not present

## 2017-10-26 DIAGNOSIS — R0602 Shortness of breath: Secondary | ICD-10-CM | POA: Diagnosis not present

## 2017-10-26 DIAGNOSIS — N485 Ulcer of penis: Secondary | ICD-10-CM | POA: Diagnosis not present

## 2017-10-29 DIAGNOSIS — E871 Hypo-osmolality and hyponatremia: Secondary | ICD-10-CM | POA: Diagnosis not present

## 2017-10-29 DIAGNOSIS — E860 Dehydration: Secondary | ICD-10-CM

## 2017-10-29 DIAGNOSIS — N179 Acute kidney failure, unspecified: Secondary | ICD-10-CM | POA: Diagnosis not present

## 2017-10-30 DIAGNOSIS — E871 Hypo-osmolality and hyponatremia: Secondary | ICD-10-CM | POA: Diagnosis not present

## 2017-10-30 DIAGNOSIS — N179 Acute kidney failure, unspecified: Secondary | ICD-10-CM | POA: Diagnosis not present

## 2017-10-30 DIAGNOSIS — E860 Dehydration: Secondary | ICD-10-CM | POA: Diagnosis not present

## 2017-10-31 DIAGNOSIS — E860 Dehydration: Secondary | ICD-10-CM | POA: Diagnosis not present

## 2017-10-31 DIAGNOSIS — N179 Acute kidney failure, unspecified: Secondary | ICD-10-CM | POA: Diagnosis not present

## 2017-10-31 DIAGNOSIS — E871 Hypo-osmolality and hyponatremia: Secondary | ICD-10-CM | POA: Diagnosis not present

## 2017-11-11 ENCOUNTER — Encounter: Payer: Self-pay | Admitting: Sports Medicine

## 2017-11-11 ENCOUNTER — Ambulatory Visit (INDEPENDENT_AMBULATORY_CARE_PROVIDER_SITE_OTHER): Payer: Medicare Other | Admitting: Sports Medicine

## 2017-11-11 DIAGNOSIS — M79671 Pain in right foot: Secondary | ICD-10-CM

## 2017-11-11 DIAGNOSIS — Z8739 Personal history of other diseases of the musculoskeletal system and connective tissue: Secondary | ICD-10-CM

## 2017-11-11 DIAGNOSIS — E1142 Type 2 diabetes mellitus with diabetic polyneuropathy: Secondary | ICD-10-CM

## 2017-11-11 DIAGNOSIS — L97512 Non-pressure chronic ulcer of other part of right foot with fat layer exposed: Secondary | ICD-10-CM

## 2017-11-11 DIAGNOSIS — Z9189 Other specified personal risk factors, not elsewhere classified: Secondary | ICD-10-CM

## 2017-11-11 NOTE — Telephone Encounter (Signed)
[facility] states they have been trying to get in touch with pt since 09/2017, even leaving message with pt's brother. Jeani Hawking states pt will probably have Krystexxa in [facility], and had said he would be willing to purchase a supplement, and this information was given to [facility] and she will discuss with pt today. Jeani Hawking states pt has an appt today and she will call Melody to remind, and Dr. Cannon Kettle has sent all of the orders to [facility].

## 2017-11-11 NOTE — Telephone Encounter (Signed)
Received [employer] Summary of Benefits Notification. 11/11/2016 - V. [employer] states pt will owe the remaining $100.00 of his deductible and 20% of the cost of the [place].

## 2017-11-11 NOTE — Progress Notes (Signed)
Subjective: Thomas [name] is a 67 y.o. diabetic male patient seen in office for follow-up evaluation of ulceration of the right foot. Patient states that he was bitten by a wolfe spider on right 2nd toe two more times since last visit and was in hospital for kidneys. States that he feels tired. Patient denies nausea, vomiting, fever, chills, night sweats, shortness of breath for pain in calf. Patient has no other pedal complaints at this time.  Fasting blood sugar not recorded  There are no active problems to display for this patient.  Current Outpatient Medications on File Prior to Visit  Medication Sig Dispense Refill  . amoxicillin-clavulanate (AUGMENTIN) 875-125 MG tablet Take 1 tablet by mouth 2 (two) times daily. 28 tablet 0  . bethanechol (URECHOLINE) 50 MG tablet Take 50 mg by mouth.    . clindamycin (CLEOCIN) 300 MG capsule Take 1 capsule (300 mg total) by mouth 3 (three) times daily. 30 capsule 0  . cloNIDine (CATAPRES) 0.3 MG tablet Take 0.3 mg by mouth.    . colchicine 0.6 MG tablet Take 0.6 mg by mouth.    . doxycycline (VIBRA-TABS) 100 MG tablet Take 1 tablet (100 mg total) by mouth 2 (two) times daily. 20 tablet 0  . furosemide (LASIX) 40 MG tablet Take 40 mg by mouth 2 (two) times daily.    Marland Kitchen gabapentin (NEURONTIN) 300 MG capsule     . glipiZIDE (GLUCOTROL XL) 10 MG 24 hr tablet     . Levothyroxine Sodium (SYNTHROID PO) Take by mouth.    . mupirocin ointment (BACTROBAN) 2 % To Right foot ulcers 30 g 1  . silver sulfADIAZINE (SILVADENE) 1 % cream Apply 1 application topically daily. (Patient not taking: Reported on 04/23/2017) 50 g 0  . tiZANidine (ZANAFLEX) 4 MG tablet      Current Facility-Administered Medications on File Prior to Visit  Medication Dose Route Frequency Provider Last Rate Last Dose  . triamcinolone acetonide (KENALOG) 10 MG/ML injection 10 mg  10 mg Other Once Landis Martins, DPM       Allergies  Allergen Reactions  . Oxycodone-Acetaminophen Other (See  Comments)    Blood pressure issues  . Oxymorphone Other (See Comments)    Causes kidney problems  . Beta Vulgaris   . Buspirone   . Cabbage   . Codeine   . Fish-Derived Products   . Pentazocine   . Propoxyphene   . Shellfish-Derived Products   . Sulfa Antibiotics Hives    No results found for this or any previous visit (from the past 2160 hour(s)).  Objective: There were no vitals filed for this visit.  General: Patient is awake, alert, oriented x 3 and in no acute distress.  Dermatology: Skin is warm and dry bilateral with a Full thickness ulceration present right plantar midfoot. Ulceration measures 4 x 5 x 0.4cm smaller than previous. There is a keratotic border with a 100% granular base. The ulceration does not probe to bone however there are keratotic margins. There is no malodor,  no erythema, no edema.   There is a scabbed over and fissure at the right posterior heel, no malodor, no active drainage, no erythema, no acute surrounding edema.   Abrasion to right 4th toe at area of gouty tophi pressing against skin.   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 No acute signs of infection to these areas. No cellulitis.    Vascular: Dorsalis Pedis pulse = 1/4 Bilateral,  Posterior Tibial  pulse =1 /4 Bilateral,  Capillary Fill Time < 5 seconds chronic swelling bilateral, secondary to history of gout with foot deformity.  Neurologic: Protective sensation absent bilateral.  Musculosketal: There is no pain at area of severe foot deformities secondary to severe gout.   No results for input(s): GRAMSTAIN, LABORGA in the last 8760 hours.  Assessment and Plan:  Problem List Items Addressed This Visit    None    Visit Diagnoses    Foot ulcer, right, with fat layer exposed ([place])    -  Primary   Personal history of gout       Diabetic polyneuropathy associated with type 2 diabetes mellitus ([place])       History of venomous spider bite        Foot pain, right          -Examined patient and Re-discussed the progression of the wound and treatment alternatives -Encouraged daily control of blood sugars and inspection of feet in the setting of diabetes  -Excisionally dedbrided ulceration at right plantar midfoot to healthy bleeding borders removing nonviable tissue using a sterile chisel blade. Wound measures post debridement as above. Wounds were debrided to the level of the dermis with viable wound base exposed to promote healing. Hemostasis was achieved with manuel pressure. Patient tolerated procedure well without any discomfort or anesthesia necessary for this wound debridement.  -Applied Medihoney and dry sterile dressing and instructed patient to continue with daily dressings at home consisting of Bactroban -Advised patient to go to the ER or return to office if the wound worsens or if constitutional symptoms are present. -Patient is awaiting to start Krystexxa infusions for gout; Secondary insurance coverage is pending; Patient to call Deanna regarding this -Patient to return to office in 2-3 weeks for follow up care and evaluation or sooner if problems arise.  Landis Martins, DPM

## 2017-11-11 NOTE — Telephone Encounter (Signed)
Left message for Krystexxa representative - Marylou Mccoy to call with information concerning the cost of the Krystexxa to pt and how to proceed if pt wanted the medication.

## 2017-12-02 ENCOUNTER — Ambulatory Visit (INDEPENDENT_AMBULATORY_CARE_PROVIDER_SITE_OTHER): Payer: Medicare Other | Admitting: Sports Medicine

## 2017-12-02 ENCOUNTER — Encounter: Payer: Self-pay | Admitting: Sports Medicine

## 2017-12-02 DIAGNOSIS — L97512 Non-pressure chronic ulcer of other part of right foot with fat layer exposed: Secondary | ICD-10-CM | POA: Diagnosis not present

## 2017-12-02 DIAGNOSIS — Z8739 Personal history of other diseases of the musculoskeletal system and connective tissue: Secondary | ICD-10-CM

## 2017-12-02 DIAGNOSIS — E1142 Type 2 diabetes mellitus with diabetic polyneuropathy: Secondary | ICD-10-CM

## 2017-12-02 MED ORDER — MUPIROCIN 2 % EX OINT: TOPICAL_OINTMENT | CUTANEOUS | 1 refills | Status: DC

## 2017-12-02 NOTE — Progress Notes (Signed)
Subjective: Thomas [name] is a 67 y.o. diabetic male patient seen in office for follow-up evaluation of ulceration of the right foot. Patient states that he was admitted to hospital for CHF and because he cut back on water had a GOUT flare up. Patient denies nausea, vomiting, fever, chills, night sweats, shortness of breath for pain in calf. Patient has no other pedal complaints at this time.  Fasting blood sugar not recorded  There are no active problems to display for this patient.  Current Outpatient Medications on File Prior to Visit  Medication Sig Dispense Refill  . amoxicillin-clavulanate (AUGMENTIN) 875-125 MG tablet Take 1 tablet by mouth 2 (two) times daily. 28 tablet 0  . bethanechol (URECHOLINE) 50 MG tablet Take 50 mg by mouth.    . clindamycin (CLEOCIN) 300 MG capsule Take 1 capsule (300 mg total) by mouth 3 (three) times daily. 30 capsule 0  . cloNIDine (CATAPRES) 0.3 MG tablet Take 0.3 mg by mouth.    . colchicine 0.6 MG tablet Take 0.6 mg by mouth.    . doxycycline (VIBRA-TABS) 100 MG tablet Take 1 tablet (100 mg total) by mouth 2 (two) times daily. 20 tablet 0  . furosemide (LASIX) 40 MG tablet Take 40 mg by mouth 2 (two) times daily.    Marland Kitchen gabapentin (NEURONTIN) 300 MG capsule     . glipiZIDE (GLUCOTROL XL) 10 MG 24 hr tablet     . Levothyroxine Sodium (SYNTHROID PO) Take by mouth.    . mupirocin ointment (BACTROBAN) 2 % To Right foot ulcers 30 g 1  . silver sulfADIAZINE (SILVADENE) 1 % cream Apply 1 application topically daily. (Patient not taking: Reported on 04/23/2017) 50 g 0  . tiZANidine (ZANAFLEX) 4 MG tablet      Current Facility-Administered Medications on File Prior to Visit  Medication Dose Route Frequency Provider Last Rate Last Dose  . triamcinolone acetonide (KENALOG) 10 MG/ML injection 10 mg  10 mg Other Once Landis Martins, DPM       Allergies  Allergen Reactions  . Oxycodone-Acetaminophen Other (See Comments)    Blood pressure issues  . Oxymorphone  Other (See Comments)    Causes kidney problems  . Beta Vulgaris   . Buspirone   . Cabbage   . Codeine   . Fish-Derived Products   . Pentazocine   . Propoxyphene   . Shellfish-Derived Products   . Sulfa Antibiotics Hives    No results found for this or any previous visit (from the past 2160 hour(s)).  Objective: There were no vitals filed for this visit.  General: Patient is awake, alert, oriented x 3 and in no acute distress.  Dermatology: Skin is warm and dry bilateral with a Full thickness ulceration present right plantar midfoot. Ulceration measures 4.5 x 5 x 0.4cm similar to previous. There is a keratotic border with a 100% granular base. The ulceration does not probe to bone however there are keratotic margins. There is no malodor,  no erythema, no edema.   There is a scabbed over and fissure at the right posterior heel, no malodor, no active drainage, no erythema, no acute surrounding edema.   Abrasion to right 4th toe at area of gouty tophi pressing against skin.   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 No acute signs of infection to these areas. No cellulitis.    Vascular: Dorsalis Pedis pulse = 1/4 Bilateral,  Posterior Tibial pulse =1 /4 Bilateral,  Capillary Fill Time <  5 seconds chronic swelling bilateral, secondary to history of gout with foot deformity.  Neurologic: Protective sensation absent bilateral.  Musculosketal: There is no pain at area of severe foot deformities secondary to severe gout.   No results for input(s): GRAMSTAIN, LABORGA in the last 8760 hours.  Assessment and Plan:  Problem List Items Addressed This Visit    None    Visit Diagnoses    Foot ulcer, right, with fat layer exposed ([place])    -  Primary   Personal history of gout       Diabetic polyneuropathy associated with type 2 diabetes mellitus ([place])          -Examined patient and Re-discussed the progression of the wound and treatment  alternatives -Encouraged daily control of blood sugars and inspection of feet in the setting of diabetes  -Excisionally dedbrided ulceration at right plantar midfoot to healthy bleeding borders removing nonviable tissue using a sterile chisel blade. Wound measures post debridement as above. Wounds were debrided to the level of the dermis with viable wound base exposed to promote healing. Hemostasis was achieved with manuel pressure. Patient tolerated procedure well without any discomfort or anesthesia necessary for this wound debridement.  -Applied antibiotic cream and dry sterile dressing and instructed patient to continue with daily dressings at home consisting of Bactroban -Advised patient to go to the ER or return to office if the wound worsens or if constitutional symptoms are present. -Patient is awaiting to start Krystexxa infusions for gout -Patient to return to office in 2-3 weeks for follow up care and evaluation or sooner if problems arise.  Landis Martins, DPM

## 2017-12-07 DIAGNOSIS — R55 Syncope and collapse: Secondary | ICD-10-CM | POA: Diagnosis not present

## 2017-12-07 DIAGNOSIS — R001 Bradycardia, unspecified: Secondary | ICD-10-CM

## 2017-12-07 DIAGNOSIS — I10 Essential (primary) hypertension: Secondary | ICD-10-CM | POA: Diagnosis not present

## 2017-12-07 DIAGNOSIS — E669 Obesity, unspecified: Secondary | ICD-10-CM | POA: Diagnosis not present

## 2017-12-07 DIAGNOSIS — E860 Dehydration: Secondary | ICD-10-CM

## 2017-12-07 DIAGNOSIS — I5030 Unspecified diastolic (congestive) heart failure: Secondary | ICD-10-CM | POA: Diagnosis not present

## 2017-12-07 DIAGNOSIS — N400 Enlarged prostate without lower urinary tract symptoms: Secondary | ICD-10-CM | POA: Diagnosis not present

## 2017-12-07 DIAGNOSIS — E871 Hypo-osmolality and hyponatremia: Secondary | ICD-10-CM

## 2017-12-07 DIAGNOSIS — E11621 Type 2 diabetes mellitus with foot ulcer: Secondary | ICD-10-CM | POA: Diagnosis not present

## 2017-12-07 DIAGNOSIS — R531 Weakness: Secondary | ICD-10-CM

## 2017-12-07 DIAGNOSIS — E1122 Type 2 diabetes mellitus with diabetic chronic kidney disease: Secondary | ICD-10-CM | POA: Diagnosis not present

## 2017-12-08 DIAGNOSIS — E11621 Type 2 diabetes mellitus with foot ulcer: Secondary | ICD-10-CM | POA: Diagnosis not present

## 2017-12-08 DIAGNOSIS — E669 Obesity, unspecified: Secondary | ICD-10-CM | POA: Diagnosis not present

## 2017-12-08 DIAGNOSIS — R55 Syncope and collapse: Secondary | ICD-10-CM | POA: Diagnosis not present

## 2017-12-08 DIAGNOSIS — N400 Enlarged prostate without lower urinary tract symptoms: Secondary | ICD-10-CM | POA: Diagnosis not present

## 2017-12-08 DIAGNOSIS — I5030 Unspecified diastolic (congestive) heart failure: Secondary | ICD-10-CM | POA: Diagnosis not present

## 2017-12-08 DIAGNOSIS — R001 Bradycardia, unspecified: Secondary | ICD-10-CM | POA: Diagnosis not present

## 2017-12-08 DIAGNOSIS — I10 Essential (primary) hypertension: Secondary | ICD-10-CM | POA: Diagnosis not present

## 2017-12-08 DIAGNOSIS — E1122 Type 2 diabetes mellitus with diabetic chronic kidney disease: Secondary | ICD-10-CM | POA: Diagnosis not present

## 2017-12-08 DIAGNOSIS — E871 Hypo-osmolality and hyponatremia: Secondary | ICD-10-CM | POA: Diagnosis not present

## 2017-12-08 DIAGNOSIS — E860 Dehydration: Secondary | ICD-10-CM | POA: Diagnosis not present

## 2017-12-08 DIAGNOSIS — R531 Weakness: Secondary | ICD-10-CM | POA: Diagnosis not present

## 2017-12-09 DIAGNOSIS — I5030 Unspecified diastolic (congestive) heart failure: Secondary | ICD-10-CM | POA: Diagnosis not present

## 2017-12-09 DIAGNOSIS — R531 Weakness: Secondary | ICD-10-CM | POA: Diagnosis not present

## 2017-12-09 DIAGNOSIS — N400 Enlarged prostate without lower urinary tract symptoms: Secondary | ICD-10-CM | POA: Diagnosis not present

## 2017-12-09 DIAGNOSIS — E669 Obesity, unspecified: Secondary | ICD-10-CM | POA: Diagnosis not present

## 2017-12-09 DIAGNOSIS — I10 Essential (primary) hypertension: Secondary | ICD-10-CM | POA: Diagnosis not present

## 2017-12-09 DIAGNOSIS — E1122 Type 2 diabetes mellitus with diabetic chronic kidney disease: Secondary | ICD-10-CM | POA: Diagnosis not present

## 2017-12-09 DIAGNOSIS — R001 Bradycardia, unspecified: Secondary | ICD-10-CM | POA: Diagnosis not present

## 2017-12-09 DIAGNOSIS — R55 Syncope and collapse: Secondary | ICD-10-CM | POA: Diagnosis not present

## 2017-12-09 DIAGNOSIS — E11621 Type 2 diabetes mellitus with foot ulcer: Secondary | ICD-10-CM | POA: Diagnosis not present

## 2017-12-09 DIAGNOSIS — E871 Hypo-osmolality and hyponatremia: Secondary | ICD-10-CM | POA: Diagnosis not present

## 2017-12-09 DIAGNOSIS — E860 Dehydration: Secondary | ICD-10-CM | POA: Diagnosis not present

## 2017-12-17 ENCOUNTER — Telehealth: Payer: Self-pay | Admitting: *Deleted

## 2017-12-17 NOTE — Telephone Encounter (Signed)
LOV given to L. Rosanne Gutting for PA to private pharmacy.

## 2017-12-18 ENCOUNTER — Ambulatory Visit (INDEPENDENT_AMBULATORY_CARE_PROVIDER_SITE_OTHER): Payer: Medicare Other | Admitting: Sports Medicine

## 2017-12-18 ENCOUNTER — Encounter: Payer: Self-pay | Admitting: Sports Medicine

## 2017-12-18 DIAGNOSIS — L97512 Non-pressure chronic ulcer of other part of right foot with fat layer exposed: Secondary | ICD-10-CM | POA: Diagnosis not present

## 2017-12-18 DIAGNOSIS — E1142 Type 2 diabetes mellitus with diabetic polyneuropathy: Secondary | ICD-10-CM

## 2017-12-18 DIAGNOSIS — Z8739 Personal history of other diseases of the musculoskeletal system and connective tissue: Secondary | ICD-10-CM

## 2017-12-18 DIAGNOSIS — M79671 Pain in right foot: Secondary | ICD-10-CM

## 2017-12-18 NOTE — Progress Notes (Signed)
Subjective: Thomas [name] is a 67 y.o. diabetic male patient seen in office for follow-up evaluation of ulceration of the right foot. Patient states that since last visit had another GOUT flare up at 2nd toe with the toe bursting open and 6 tablespoons of chalky material came from the toe. Patient states that he put antibiotic cream on the area and slowly got better. Today is 3 x smaller than what it was. Patient denies nausea, vomiting, fever, chills, night sweats, shortness of breath for pain in calf. Patient has no other pedal complaints at this time.  Fasting blood sugar not recorded  There are no active problems to display for this patient.  Current Outpatient Medications on File Prior to Visit  Medication Sig Dispense Refill  . amoxicillin-clavulanate (AUGMENTIN) 875-125 MG tablet Take 1 tablet by mouth 2 (two) times daily. 28 tablet 0  . bethanechol (URECHOLINE) 50 MG tablet Take 50 mg by mouth.    . clindamycin (CLEOCIN) 300 MG capsule Take 1 capsule (300 mg total) by mouth 3 (three) times daily. 30 capsule 0  . cloNIDine (CATAPRES) 0.3 MG tablet Take 0.3 mg by mouth.    . colchicine 0.6 MG tablet Take 0.6 mg by mouth.    . doxycycline (VIBRA-TABS) 100 MG tablet Take 1 tablet (100 mg total) by mouth 2 (two) times daily. 20 tablet 0  . furosemide (LASIX) 40 MG tablet Take 40 mg by mouth 2 (two) times daily.    Marland Kitchen gabapentin (NEURONTIN) 300 MG capsule     . glipiZIDE (GLUCOTROL XL) 10 MG 24 hr tablet     . Levothyroxine Sodium (SYNTHROID PO) Take by mouth.    . mupirocin ointment (BACTROBAN) 2 % To Right foot ulcers 30 g 1  . silver sulfADIAZINE (SILVADENE) 1 % cream Apply 1 application topically daily. (Patient not taking: Reported on 04/23/2017) 50 g 0  . tiZANidine (ZANAFLEX) 4 MG tablet      Current Facility-Administered Medications on File Prior to Visit  Medication Dose Route Frequency Provider Last Rate Last Dose  . triamcinolone acetonide (KENALOG) 10 MG/ML injection 10 mg  10  mg Other Once Landis Martins, DPM       Allergies  Allergen Reactions  . Oxycodone-Acetaminophen Other (See Comments)    Blood pressure issues  . Oxymorphone Other (See Comments)    Causes kidney problems  . Beta Vulgaris   . Buspirone   . Cabbage   . Codeine   . Fish-Derived Products   . Pentazocine   . Propoxyphene   . Shellfish-Derived Products   . Sulfa Antibiotics Hives    No results found for this or any previous visit (from the past 2160 hour(s)).  Objective: There were no vitals filed for this visit.  General: Patient is awake, alert, oriented x 3 and in no acute distress.  Dermatology: Skin is warm and dry bilateral with a Full thickness ulceration present right plantar midfoot. Ulceration measures 4.4 x 5 x 0.3cm similar to previous. There is a keratotic border with a 100% granular base. The ulceration does not probe to bone however there are keratotic margins. There is no malodor,  no erythema, no edema.   There is a scabbed over and fissure at the right posterior heel, no malodor, no active drainage, no erythema, no acute surrounding edema.   Abrasion to right 4th toe at area of gouty tophi pressing against skin. 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 No acute signs of infection to these areas. No cellulitis.    Vascular: Dorsalis Pedis pulse = 1/4 Bilateral,  Posterior Tibial pulse =1 /4 Bilateral,  Capillary Fill Time < 5 seconds chronic swelling bilateral, secondary to history of gout with foot deformity.  Neurologic: Protective sensation absent bilateral.  Musculosketal: There is no pain at area of severe foot deformities secondary to severe gout.   No results for input(s): GRAMSTAIN, LABORGA in the last 8760 hours.  Assessment and Plan:  Problem List Items Addressed This Visit    None    Visit Diagnoses    Foot ulcer, right, with fat layer  exposed ([place])    -  Primary   Personal history of gout       Diabetic polyneuropathy associated with type 2 diabetes mellitus ([place])       Foot pain, right          -Examined patient and Re-discussed the progression of the wound and treatment alternatives -Encouraged daily control of blood sugars and inspection of feet in the setting of diabetes and gentle hydration to avoid flare up with gout  -Excisionally dedbrided ulceration at right plantar midfoot to healthy bleeding borders removing nonviable tissue using a sterile chisel blade. Wound measures post debridement as above. Wounds were debrided to the level of the dermis with viable wound base exposed to promote healing. Hemostasis was achieved with manuel pressure. Patient tolerated procedure well without any discomfort or anesthesia necessary for this wound debridement.  -Applied antibiotic cream and dry sterile dressing and instructed patient to continue with daily dressings at home consisting of Bactroban -Advised patient to go to the ER or return to office if the wound worsens or if constitutional symptoms are present. -Patient is awaiting to start Krystexxa infusions for gout; Patient will possibly start this month -Patient to return to office in 2-3 weeks for follow up care and evaluation or sooner if problems arise.  Landis Martins, DPM

## 2017-12-28 ENCOUNTER — Telehealth: Payer: Self-pay | Admitting: *Deleted

## 2017-12-28 DIAGNOSIS — M1A09X0 Idiopathic chronic gout, multiple sites, without tophus (tophi): Secondary | ICD-10-CM

## 2017-12-28 NOTE — Telephone Encounter (Signed)
Received Approval for Krystexxa valid 10/20/2017 - 12/22/2018. Thomas [name] had stated she would call with instructions once approval received.

## 2017-12-28 NOTE — Telephone Encounter (Signed)
Thomas [name] - Krystexxa states pt has been informed and therapy will be at [facility], please send referral, clinicals and demographics to [facility].

## 2017-12-28 NOTE — Telephone Encounter (Addendum)
Faxed referral, clinicals, [employer] Notice of Approval of medicare Prescription Drug Coverage and demographics to [facility] Attn: [facility].

## 2017-12-29 NOTE — Telephone Encounter (Signed)
Thanks

## 2018-01-08 ENCOUNTER — Ambulatory Visit: Payer: Medicare Other | Admitting: Sports Medicine

## 2018-01-15 ENCOUNTER — Encounter: Payer: Self-pay | Admitting: Sports Medicine

## 2018-01-15 ENCOUNTER — Ambulatory Visit (INDEPENDENT_AMBULATORY_CARE_PROVIDER_SITE_OTHER): Payer: Medicare Other | Admitting: Sports Medicine

## 2018-01-15 DIAGNOSIS — M79671 Pain in right foot: Secondary | ICD-10-CM

## 2018-01-15 DIAGNOSIS — E1142 Type 2 diabetes mellitus with diabetic polyneuropathy: Secondary | ICD-10-CM

## 2018-01-15 DIAGNOSIS — L97512 Non-pressure chronic ulcer of other part of right foot with fat layer exposed: Secondary | ICD-10-CM | POA: Diagnosis not present

## 2018-01-15 DIAGNOSIS — Z8739 Personal history of other diseases of the musculoskeletal system and connective tissue: Secondary | ICD-10-CM

## 2018-01-15 DIAGNOSIS — M10071 Idiopathic gout, right ankle and foot: Secondary | ICD-10-CM | POA: Diagnosis not present

## 2018-01-15 DIAGNOSIS — M1A09X0 Idiopathic chronic gout, multiple sites, without tophus (tophi): Secondary | ICD-10-CM | POA: Diagnosis not present

## 2018-01-15 MED ORDER — TRIAMCINOLONE ACETONIDE 10 MG/ML IJ SUSP: 10.0000 mg | Freq: Once | INTRAMUSCULAR | Status: DC

## 2018-01-15 NOTE — Progress Notes (Signed)
Subjective: Thomas [name] is a 67 y.o. diabetic male patient seen in office for follow-up evaluation of ulceration of the right foot. Patient states that since last visit had another GOUT flare up at 5th met base on right that hurts badly; requesting injection. Patient denies nausea, vomiting, fever, chills, night sweats, shortness of breath for pain in calf. Patient has no other pedal complaints at this time.  Fasting blood sugar not recorded  There are no active problems to display for this patient.  Current Outpatient Medications on File Prior to Visit  Medication Sig Dispense Refill  . amoxicillin-clavulanate (AUGMENTIN) 875-125 MG tablet Take 1 tablet by mouth 2 (two) times daily. 28 tablet 0  . bethanechol (URECHOLINE) 50 MG tablet Take 50 mg by mouth.    . clindamycin (CLEOCIN) 300 MG capsule Take 1 capsule (300 mg total) by mouth 3 (three) times daily. 30 capsule 0  . cloNIDine (CATAPRES) 0.3 MG tablet Take 0.3 mg by mouth.    . colchicine 0.6 MG tablet Take 0.6 mg by mouth.    . doxycycline (VIBRA-TABS) 100 MG tablet Take 1 tablet (100 mg total) by mouth 2 (two) times daily. 20 tablet 0  . furosemide (LASIX) 40 MG tablet Take 40 mg by mouth 2 (two) times daily.    Marland Kitchen gabapentin (NEURONTIN) 300 MG capsule     . glipiZIDE (GLUCOTROL XL) 10 MG 24 hr tablet     . Levothyroxine Sodium (SYNTHROID PO) Take by mouth.    . mupirocin ointment (BACTROBAN) 2 % To Right foot ulcers 30 g 1  . silver sulfADIAZINE (SILVADENE) 1 % cream Apply 1 application topically daily. (Patient not taking: Reported on 04/23/2017) 50 g 0  . tiZANidine (ZANAFLEX) 4 MG tablet      Current Facility-Administered Medications on File Prior to Visit  Medication Dose Route Frequency Provider Last Rate Last Dose  . triamcinolone acetonide (KENALOG) 10 MG/ML injection 10 mg  10 mg Other Once Landis Martins, DPM       Allergies  Allergen Reactions  . Oxycodone-Acetaminophen Other (See Comments)    Blood pressure issues   . Oxymorphone Other (See Comments)    Causes kidney problems  . Beta Vulgaris   . Buspirone   . Cabbage   . Codeine   . Fish-Derived Products   . Pentazocine   . Propoxyphene   . Shellfish-Derived Products   . Sulfa Antibiotics Hives    No results found for this or any previous visit (from the past 2160 hour(s)).  Objective: There were no vitals filed for this visit.  General: Patient is awake, alert, oriented x 3 and in no acute distress.  Dermatology: Skin is warm and dry bilateral with a Full thickness ulceration present right plantar midfoot. Ulceration measures 4.5 x 5 x 0.3cm similar to previous. There is a keratotic border with a 100% granular base. The ulceration does not probe to bone however there are keratotic margins. There is no malodor,  no erythema, no edema.   Abrasion to right 4th toe at area of gouty tophi pressing against skin. 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 No acute signs of infection to these areas. No cellulitis.    Vascular: Dorsalis Pedis pulse = 1/4 Bilateral,  Posterior Tibial pulse =1 /4 Bilateral,  Capillary Fill Time < 5 seconds chronic swelling bilateral, secondary to history of gout with foot deformity.  Neurologic:  Protective sensation absent bilateral.  Musculosketal: There is pain at right fifth metatarsal base area of new gouty tophi forming.  Overall at other areas there is no pain however there is severe foot deformities secondary to severe gout.   No results for input(s): GRAMSTAIN, LABORGA in the last 8760 hours.  Assessment and Plan:  Problem List Items Addressed This Visit    None    Visit Diagnoses    Chronic gout of multiple sites, unspecified cause    -  Primary   Foot ulcer, right, with fat layer exposed ([place])       Personal history of gout       Diabetic polyneuropathy associated with type 2 diabetes mellitus  ([place])       Foot pain, right          -Examined patient and Re-discussed the progression of the wound and treatment alternatives -Encouraged daily control of blood sugars and inspection of feet in the setting of diabetes and gentle hydration to avoid flare up with gout -After oral consent and aseptic prep, injected a mixture containing 1 ml of 2%  plain lidocaine, 1 ml 0.5% plain marcaine, 0.5 ml of kenalog 10 and 0.5 ml of dexamethasone phosphate into right fifth metatarsal base for gout without complication. Post-injection care discussed with patient.   -Excisionally dedbrided ulceration at right plantar midfoot to healthy bleeding borders removing nonviable tissue using a sterile chisel blade. Wound measures post debridement as above. Wounds were debrided to the level of the dermis with viable wound base exposed to promote healing. Hemostasis was achieved with manuel pressure. Patient tolerated procedure well without any discomfort or anesthesia necessary for this wound debridement.  -Applied antibiotic cream and dry sterile dressing and instructed patient to continue with daily dressings at home consisting of Bactroban -Advised patient to go to the ER or return to office if the wound worsens or if constitutional symptoms are present. -Patient is awaiting to start Krystexxa infusions for gout; Patient will possibly start on Thursday -Patient to return to office in 2-3 weeks for follow up care and evaluation or sooner if problems arise.  Landis Martins, DPM

## 2018-01-27 ENCOUNTER — Telehealth: Payer: Self-pay | Admitting: *Deleted

## 2018-01-27 NOTE — Telephone Encounter (Signed)
[facility] request copy of Uric Acid and G6P, CBC and basic met. I was unable to find the results in Epic.

## 2018-01-29 ENCOUNTER — Ambulatory Visit (INDEPENDENT_AMBULATORY_CARE_PROVIDER_SITE_OTHER): Payer: Medicare Other | Admitting: Sports Medicine

## 2018-01-29 ENCOUNTER — Encounter: Payer: Self-pay | Admitting: Sports Medicine

## 2018-01-29 DIAGNOSIS — M79671 Pain in right foot: Secondary | ICD-10-CM | POA: Diagnosis not present

## 2018-01-29 DIAGNOSIS — L97512 Non-pressure chronic ulcer of other part of right foot with fat layer exposed: Secondary | ICD-10-CM

## 2018-01-29 DIAGNOSIS — E1142 Type 2 diabetes mellitus with diabetic polyneuropathy: Secondary | ICD-10-CM

## 2018-01-29 DIAGNOSIS — M1A09X0 Idiopathic chronic gout, multiple sites, without tophus (tophi): Secondary | ICD-10-CM

## 2018-01-29 NOTE — Progress Notes (Signed)
Subjective: Thomas [name] is a 67 y.o. diabetic male patient seen in office for follow-up evaluation of ulceration of the right foot. Patient states that he was seen by Rheumatologist and will require 2 years of treatment. Patient is still battling with swelling especially in right foot and gout flares in toes. Patient denies nausea, vomiting, fever, chills, night sweats, shortness of breath for pain in calf. Patient has no other pedal complaints at this time.  Fasting blood sugar not recorded  There are no active problems to display for this patient.  Current Outpatient Medications on File Prior to Visit  Medication Sig Dispense Refill  . amoxicillin-clavulanate (AUGMENTIN) 875-125 MG tablet Take 1 tablet by mouth 2 (two) times daily. 28 tablet 0  . bethanechol (URECHOLINE) 50 MG tablet Take 50 mg by mouth.    . clindamycin (CLEOCIN) 300 MG capsule Take 1 capsule (300 mg total) by mouth 3 (three) times daily. 30 capsule 0  . cloNIDine (CATAPRES) 0.3 MG tablet Take 0.3 mg by mouth.    . colchicine 0.6 MG tablet Take 0.6 mg by mouth.    . doxycycline (VIBRA-TABS) 100 MG tablet Take 1 tablet (100 mg total) by mouth 2 (two) times daily. 20 tablet 0  . furosemide (LASIX) 40 MG tablet Take 40 mg by mouth 2 (two) times daily.    Marland Kitchen gabapentin (NEURONTIN) 300 MG capsule     . glipiZIDE (GLUCOTROL XL) 10 MG 24 hr tablet     . Levothyroxine Sodium (SYNTHROID PO) Take by mouth.    . mupirocin ointment (BACTROBAN) 2 % To Right foot ulcers 30 g 1  . silver sulfADIAZINE (SILVADENE) 1 % cream Apply 1 application topically daily. (Patient not taking: Reported on 04/23/2017) 50 g 0  . tiZANidine (ZANAFLEX) 4 MG tablet      Current Facility-Administered Medications on File Prior to Visit  Medication Dose Route Frequency Provider Last Rate Last Dose  . triamcinolone acetonide (KENALOG) 10 MG/ML injection 10 mg  10 mg Other Once Landis Martins, DPM      . triamcinolone acetonide (KENALOG) 10 MG/ML injection  10 mg  10 mg Other Once Landis Martins, DPM       Allergies  Allergen Reactions  . Oxycodone-Acetaminophen Other (See Comments)    Blood pressure issues  . Oxymorphone Other (See Comments)    Causes kidney problems  . Beta Vulgaris   . Buspirone   . Cabbage   . Codeine   . Fish-Derived Products   . Pentazocine   . Propoxyphene   . Shellfish-Derived Products   . Sulfa Antibiotics Hives    No results found for this or any previous visit (from the past 2160 hour(s)).  Objective: There were no vitals filed for this visit.  General: Patient is awake, alert, oriented x 3 and in no acute distress.  Dermatology: Skin is warm and dry bilateral with a Full thickness ulceration present right plantar midfoot. Ulceration measures 4.5 x 5 x 0.3cm same as previous. There is a keratotic border with a 100% granular base. The ulceration does not probe to bone however there are keratotic margins. There is no malodor,  no erythema, no edema.   Abrasion to right 4th toe at area of gouty tophi pressing against skin. 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 No acute signs of infection to these areas. No cellulitis.    Vascular: Dorsalis  Pedis pulse = 1/4 Bilateral,  Posterior Tibial pulse =1 /4 Bilateral,  Capillary Fill Time < 5 seconds chronic swelling bilateral, secondary to history of gout with foot deformity.  Neurologic: Protective sensation absent bilateral.  Musculosketal: ++Gout, there is severe foot deformities secondary to severe gout.   No results for input(s): GRAMSTAIN, LABORGA in the last 8760 hours.  Assessment and Plan:  Problem List Items Addressed This Visit    None    Visit Diagnoses    Foot ulcer, right, with fat layer exposed ([place])    -  Primary   Foot pain, right       Chronic gout of multiple sites, unspecified cause       Diabetic  polyneuropathy associated with type 2 diabetes mellitus ([place])          -Examined patient and Re-discussed the progression of the wound and treatment alternatives -Encouraged daily control of blood sugars and inspection of feet in the setting of diabetes and gentle hydration to avoid flare up with gout  -Excisionally dedbrided ulceration at right plantar midfoot to healthy bleeding borders removing nonviable tissue using a sterile chisel blade. Wound measures post debridement as above. Wounds were debrided to the level of the dermis with viable wound base exposed to promote healing. Hemostasis was achieved with manuel pressure. Patient tolerated procedure well without any discomfort or anesthesia necessary for this wound debridement.  -Applied antibiotic cream and dry sterile dressing and instructed patient to continue with daily dressings at home consisting of Bactroban as previous  -Patient to contact [employer] for [place] to try to get him new shoes; I advised patient to have the company to fax me any forms that need to be completed on his behalf -Advised patient to go to the ER or return to office if the wound worsens or if constitutional symptoms are present. -Patient is awaiting to start Krystexxa infusions for gout; Patient was seen by Rheumatology and will start treatment in May -Patient to return to office in 2-3 weeks for follow up care and evaluation or sooner if problems arise.  Landis Martins, DPM

## 2018-02-19 ENCOUNTER — Ambulatory Visit (INDEPENDENT_AMBULATORY_CARE_PROVIDER_SITE_OTHER): Payer: Medicare Other | Admitting: Sports Medicine

## 2018-02-19 ENCOUNTER — Encounter: Payer: Self-pay | Admitting: Sports Medicine

## 2018-02-19 DIAGNOSIS — M79671 Pain in right foot: Secondary | ICD-10-CM

## 2018-02-19 DIAGNOSIS — L97512 Non-pressure chronic ulcer of other part of right foot with fat layer exposed: Secondary | ICD-10-CM | POA: Diagnosis not present

## 2018-02-19 DIAGNOSIS — M1A09X0 Idiopathic chronic gout, multiple sites, without tophus (tophi): Secondary | ICD-10-CM

## 2018-02-19 DIAGNOSIS — E1142 Type 2 diabetes mellitus with diabetic polyneuropathy: Secondary | ICD-10-CM

## 2018-02-19 NOTE — Progress Notes (Signed)
Subjective: Thomas [name] is a 67 y.o. diabetic male patient seen in office for follow-up evaluation of ulceration of the right foot. Patient states that he had his 1st treatment on Wednesday and his joints feel good. Admits bleeding from Right foot ulceration but has been using antibiotic cream as previous. Patient denies nausea, vomiting, fever, chills, night sweats, shortness of breath for pain in calf. Patient has no other pedal complaints at this time.  Fasting blood sugar not recorded  There are no active problems to display for this patient.  Current Outpatient Medications on File Prior to Visit  Medication Sig Dispense Refill  . amoxicillin-clavulanate (AUGMENTIN) 875-125 MG tablet Take 1 tablet by mouth 2 (two) times daily. 28 tablet 0  . bethanechol (URECHOLINE) 50 MG tablet Take 50 mg by mouth.    . clindamycin (CLEOCIN) 300 MG capsule Take 1 capsule (300 mg total) by mouth 3 (three) times daily. 30 capsule 0  . cloNIDine (CATAPRES) 0.3 MG tablet Take 0.3 mg by mouth.    . colchicine 0.6 MG tablet Take 0.6 mg by mouth.    . doxycycline (VIBRA-TABS) 100 MG tablet Take 1 tablet (100 mg total) by mouth 2 (two) times daily. 20 tablet 0  . furosemide (LASIX) 40 MG tablet Take 40 mg by mouth 2 (two) times daily.    Marland Kitchen gabapentin (NEURONTIN) 300 MG capsule     . glipiZIDE (GLUCOTROL XL) 10 MG 24 hr tablet     . Levothyroxine Sodium (SYNTHROID PO) Take by mouth.    . mupirocin ointment (BACTROBAN) 2 % To Right foot ulcers 30 g 1  . silver sulfADIAZINE (SILVADENE) 1 % cream Apply 1 application topically daily. 50 g 0  . tiZANidine (ZANAFLEX) 4 MG tablet      Current Facility-Administered Medications on File Prior to Visit  Medication Dose Route Frequency Provider Last Rate Last Dose  . triamcinolone acetonide (KENALOG) 10 MG/ML injection 10 mg  10 mg Other Once Landis Martins, DPM      . triamcinolone acetonide (KENALOG) 10 MG/ML injection 10 mg  10 mg Other Once Landis Martins, DPM        Allergies  Allergen Reactions  . Oxycodone-Acetaminophen Other (See Comments)    Blood pressure issues  . Oxymorphone Other (See Comments)    Causes kidney problems  . Beta Vulgaris   . Buspirone   . Cabbage   . Codeine   . Fish-Derived Products   . Pentazocine   . Propoxyphene   . Shellfish-Derived Products   . Sulfa Antibiotics Hives    No results found for this or any previous visit (from the past 2160 hour(s)).  Objective: There were no vitals filed for this visit.  General: Patient is awake, alert, oriented x 3 and in no acute distress.  Dermatology: Skin is warm and dry bilateral with a Full thickness ulceration present right plantar midfoot. Ulceration measures 4.0 x 5 x 0.3cm slightly smaller than previous. There is a keratotic border with a 100% granular base. The ulceration does not probe to bone however there are keratotic margins. There is no malodor,  no erythema, no edema.   Abrasion to right 4th toe at area of gouty tophi pressing against skin. 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 No acute signs of infection to these areas. No cellulitis.    Vascular: Dorsalis Pedis pulse = 1/4 Bilateral,  Posterior Tibial pulse =1 /4 Bilateral,  Capillary Fill Time < 5 seconds chronic swelling bilateral, secondary to history of gout with foot deformity.  Neurologic: Protective sensation absent bilateral.  Musculosketal: ++Gout, there is severe foot deformities secondary to severe gout.   No results for input(s): GRAMSTAIN, LABORGA in the last 8760 hours.  Assessment and Plan:  Problem List Items Addressed This Visit    None    Visit Diagnoses    Foot ulcer, right, with fat layer exposed ([place])    -  Primary   Foot pain, right       Chronic gout of multiple sites, unspecified cause       Diabetic polyneuropathy associated with type 2  diabetes mellitus ([place])         -Examined patient and Re-discussed the progression of the wound and treatment alternatives -Encouraged daily control of blood sugars and inspection of feet in the setting of diabetes   -Excisionally dedbrided ulceration at right plantar midfoot to healthy bleeding borders removing nonviable tissue using a sterile chisel blade. Wound measures post debridement as above. Wounds were debrided to the level of the dermis with viable wound base exposed to promote healing. Hemostasis was achieved with manuel pressure. Patient tolerated procedure well without any discomfort or anesthesia necessary for this wound debridement.  -Applied Medihoney and dry sterile dressing and instructed patient to continue with daily dressings at home consisting of Bactroban as previous once he had ran out will switch to [employer] to repair current custom shoes and to see if patient is a candidate for a new set of custom molded shoes -Continue with infusions with Rheumatologist of [place] patient to go to the ER or return to office if the wound worsens or if constitutional symptoms are present. -Patient to return to office in 2-3 weeks for follow up care and evaluation or sooner if problems arise.  Landis Martins, DPM

## 2018-03-10 ENCOUNTER — Ambulatory Visit (INDEPENDENT_AMBULATORY_CARE_PROVIDER_SITE_OTHER): Payer: Medicare Other | Admitting: Sports Medicine

## 2018-03-10 ENCOUNTER — Encounter: Payer: Self-pay | Admitting: Sports Medicine

## 2018-03-10 VITALS — BP 131/72 | HR 76 | Temp 97.9°F

## 2018-03-10 DIAGNOSIS — L97512 Non-pressure chronic ulcer of other part of right foot with fat layer exposed: Secondary | ICD-10-CM

## 2018-03-10 DIAGNOSIS — E1142 Type 2 diabetes mellitus with diabetic polyneuropathy: Secondary | ICD-10-CM

## 2018-03-10 DIAGNOSIS — M79671 Pain in right foot: Secondary | ICD-10-CM

## 2018-03-10 DIAGNOSIS — M1A09X0 Idiopathic chronic gout, multiple sites, without tophus (tophi): Secondary | ICD-10-CM

## 2018-03-10 NOTE — Progress Notes (Signed)
Subjective: Thomas [name] is a 67 y.o. diabetic male patient seen in office for follow-up evaluation of ulceration of the right foot. Patient is on Krystexxa for Gout. Reports that he has noticed more drainage yellow from wound that's soaking an ABD pad but no additional pain. Has been using on Right foot ulceration antibiotic cream as previous. Patient denies nausea, vomiting, fever, chills, night sweats, shortness of breath for pain in calf. Patient has no other pedal complaints at this time.  Fasting blood sugar not recorded  There are no active problems to display for this patient.  Current Outpatient Medications on File Prior to Visit  Medication Sig Dispense Refill  . amoxicillin-clavulanate (AUGMENTIN) 875-125 MG tablet Take 1 tablet by mouth 2 (two) times daily. 28 tablet 0  . bethanechol (URECHOLINE) 50 MG tablet Take 50 mg by mouth.    . clindamycin (CLEOCIN) 300 MG capsule Take 1 capsule (300 mg total) by mouth 3 (three) times daily. 30 capsule 0  . cloNIDine (CATAPRES) 0.3 MG tablet Take 0.3 mg by mouth.    . colchicine 0.6 MG tablet Take 0.6 mg by mouth.    . doxycycline (VIBRA-TABS) 100 MG tablet Take 1 tablet (100 mg total) by mouth 2 (two) times daily. 20 tablet 0  . furosemide (LASIX) 40 MG tablet Take 40 mg by mouth 2 (two) times daily.    Marland Kitchen gabapentin (NEURONTIN) 300 MG capsule     . glipiZIDE (GLUCOTROL XL) 10 MG 24 hr tablet     . Levothyroxine Sodium (SYNTHROID PO) Take by mouth.    . mupirocin ointment (BACTROBAN) 2 % To Right foot ulcers 30 g 1  . silver sulfADIAZINE (SILVADENE) 1 % cream Apply 1 application topically daily. 50 g 0  . tiZANidine (ZANAFLEX) 4 MG tablet      Current Facility-Administered Medications on File Prior to Visit  Medication Dose Route Frequency Provider Last Rate Last Dose  . triamcinolone acetonide (KENALOG) 10 MG/ML injection 10 mg  10 mg Other Once Landis Martins, DPM      . triamcinolone acetonide (KENALOG) 10 MG/ML injection 10 mg   10 mg Other Once Landis Martins, DPM       Allergies  Allergen Reactions  . Oxycodone-Acetaminophen Other (See Comments)    Blood pressure issues  . Oxymorphone Other (See Comments)    Causes kidney problems  . Beta Vulgaris   . Buspirone   . Cabbage   . Codeine   . Fish-Derived Products   . Pentazocine   . Propoxyphene   . Shellfish-Derived Products   . Sulfa Antibiotics Hives    No results found for this or any previous visit (from the past 2160 hour(s)).  Objective: Vitals:   03/10/18 0900  BP: 131/72  Pulse: 76  Temp: 97.9 F (36.6 C)   General: Patient is awake, alert, oriented x 3 and in no acute distress.  Dermatology: Skin is warm and dry bilateral with a Full thickness ulceration present right plantar midfoot. Ulceration measures 5 x 4.8 x 0.3cm similar to previous. There is a keratotic border with a 100% granular base. The ulceration does not probe to bone however there are keratotic margins. There is no malodor,  no erythema, no edema.   Abrasion to right 4th toe at area of gouty tophi pressing against skin. 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 No acute signs of infection  to these areas. No cellulitis.    Vascular: Dorsalis Pedis pulse = 1/4 Bilateral,  Posterior Tibial pulse =1 /4 Bilateral,  Capillary Fill Time < 5 seconds chronic swelling bilateral, secondary to history of gout with foot deformity.  Neurologic: Protective sensation absent bilateral.  Musculosketal: ++Gout, there is severe foot deformities secondary to severe gout.   No results for input(s): GRAMSTAIN, LABORGA in the last 8760 hours.  Assessment and Plan:  Problem List Items Addressed This Visit    None    Visit Diagnoses    Foot ulcer, right, with fat layer exposed ([place])    -  Primary   Foot pain, right       Chronic gout of multiple sites, unspecified  cause       Diabetic polyneuropathy associated with type 2 diabetes mellitus ([place])         -Examined patient and Re-discussed the progression of the wound and treatment alternatives -Encouraged daily control of blood sugars and inspection of feet in the setting of diabetes   -Excisionally dedbrided ulceration at right plantar midfoot to healthy bleeding borders removing nonviable tissue using a sterile chisel blade. Wound measures post debridement as above. Wounds were debrided to the level of the dermis with viable wound base exposed to promote healing. Hemostasis was achieved with manuel pressure. Patient tolerated procedure well without any discomfort or anesthesia necessary for this wound debridement.  -Applied Medihoney and dry sterile dressing and instructed patient to continue with daily dressings at home consisting of Bactroban as previous once he had ran out will switch to Medihoney as previous  -Patient is awaiting new shoes/repairs has an appt at [facility] with infusions with Rheumatologist of [place] patient to go to the ER or return to office if the wound worsens or if constitutional symptoms are present. -Patient to return to office in 3 weeks for follow up care and evaluation or sooner if problems arise.  Landis Martins, DPM

## 2018-03-12 ENCOUNTER — Ambulatory Visit: Payer: Medicare Other | Admitting: Sports Medicine

## 2018-03-16 DIAGNOSIS — E871 Hypo-osmolality and hyponatremia: Secondary | ICD-10-CM | POA: Diagnosis not present

## 2018-03-16 DIAGNOSIS — R531 Weakness: Secondary | ICD-10-CM | POA: Diagnosis not present

## 2018-03-16 DIAGNOSIS — I5033 Acute on chronic diastolic (congestive) heart failure: Secondary | ICD-10-CM

## 2018-03-16 DIAGNOSIS — M109 Gout, unspecified: Secondary | ICD-10-CM | POA: Diagnosis not present

## 2018-03-16 DIAGNOSIS — I509 Heart failure, unspecified: Secondary | ICD-10-CM

## 2018-03-16 DIAGNOSIS — E1122 Type 2 diabetes mellitus with diabetic chronic kidney disease: Secondary | ICD-10-CM

## 2018-03-16 DIAGNOSIS — R0602 Shortness of breath: Secondary | ICD-10-CM | POA: Diagnosis not present

## 2018-03-16 DIAGNOSIS — R0902 Hypoxemia: Secondary | ICD-10-CM | POA: Diagnosis not present

## 2018-03-16 DIAGNOSIS — I10 Essential (primary) hypertension: Secondary | ICD-10-CM | POA: Diagnosis not present

## 2018-03-17 DIAGNOSIS — E1122 Type 2 diabetes mellitus with diabetic chronic kidney disease: Secondary | ICD-10-CM | POA: Diagnosis not present

## 2018-03-17 DIAGNOSIS — E871 Hypo-osmolality and hyponatremia: Secondary | ICD-10-CM | POA: Diagnosis not present

## 2018-03-17 DIAGNOSIS — R0902 Hypoxemia: Secondary | ICD-10-CM | POA: Diagnosis not present

## 2018-03-17 DIAGNOSIS — I10 Essential (primary) hypertension: Secondary | ICD-10-CM | POA: Diagnosis not present

## 2018-03-17 DIAGNOSIS — M109 Gout, unspecified: Secondary | ICD-10-CM | POA: Diagnosis not present

## 2018-03-17 DIAGNOSIS — I5033 Acute on chronic diastolic (congestive) heart failure: Secondary | ICD-10-CM | POA: Diagnosis not present

## 2018-03-17 DIAGNOSIS — I509 Heart failure, unspecified: Secondary | ICD-10-CM | POA: Diagnosis not present

## 2018-03-17 DIAGNOSIS — R531 Weakness: Secondary | ICD-10-CM | POA: Diagnosis not present

## 2018-03-18 DIAGNOSIS — E1122 Type 2 diabetes mellitus with diabetic chronic kidney disease: Secondary | ICD-10-CM | POA: Diagnosis not present

## 2018-03-18 DIAGNOSIS — E871 Hypo-osmolality and hyponatremia: Secondary | ICD-10-CM | POA: Diagnosis not present

## 2018-03-18 DIAGNOSIS — I5033 Acute on chronic diastolic (congestive) heart failure: Secondary | ICD-10-CM | POA: Diagnosis not present

## 2018-03-18 DIAGNOSIS — I10 Essential (primary) hypertension: Secondary | ICD-10-CM | POA: Diagnosis not present

## 2018-03-18 DIAGNOSIS — R0902 Hypoxemia: Secondary | ICD-10-CM | POA: Diagnosis not present

## 2018-03-18 DIAGNOSIS — M109 Gout, unspecified: Secondary | ICD-10-CM | POA: Diagnosis not present

## 2018-03-18 DIAGNOSIS — I509 Heart failure, unspecified: Secondary | ICD-10-CM | POA: Diagnosis not present

## 2018-03-18 DIAGNOSIS — R531 Weakness: Secondary | ICD-10-CM | POA: Diagnosis not present

## 2018-03-19 DIAGNOSIS — M109 Gout, unspecified: Secondary | ICD-10-CM | POA: Diagnosis not present

## 2018-03-19 DIAGNOSIS — E871 Hypo-osmolality and hyponatremia: Secondary | ICD-10-CM | POA: Diagnosis not present

## 2018-03-19 DIAGNOSIS — I509 Heart failure, unspecified: Secondary | ICD-10-CM | POA: Diagnosis not present

## 2018-03-19 DIAGNOSIS — I10 Essential (primary) hypertension: Secondary | ICD-10-CM | POA: Diagnosis not present

## 2018-03-19 DIAGNOSIS — E1122 Type 2 diabetes mellitus with diabetic chronic kidney disease: Secondary | ICD-10-CM | POA: Diagnosis not present

## 2018-03-19 DIAGNOSIS — R0902 Hypoxemia: Secondary | ICD-10-CM | POA: Diagnosis not present

## 2018-03-19 DIAGNOSIS — R531 Weakness: Secondary | ICD-10-CM | POA: Diagnosis not present

## 2018-03-19 DIAGNOSIS — I5033 Acute on chronic diastolic (congestive) heart failure: Secondary | ICD-10-CM | POA: Diagnosis not present

## 2018-03-20 DIAGNOSIS — E871 Hypo-osmolality and hyponatremia: Secondary | ICD-10-CM | POA: Diagnosis not present

## 2018-03-20 DIAGNOSIS — M109 Gout, unspecified: Secondary | ICD-10-CM | POA: Diagnosis not present

## 2018-03-20 DIAGNOSIS — R531 Weakness: Secondary | ICD-10-CM | POA: Diagnosis not present

## 2018-03-20 DIAGNOSIS — E1122 Type 2 diabetes mellitus with diabetic chronic kidney disease: Secondary | ICD-10-CM | POA: Diagnosis not present

## 2018-03-20 DIAGNOSIS — I10 Essential (primary) hypertension: Secondary | ICD-10-CM | POA: Diagnosis not present

## 2018-03-20 DIAGNOSIS — I5033 Acute on chronic diastolic (congestive) heart failure: Secondary | ICD-10-CM | POA: Diagnosis not present

## 2018-03-20 DIAGNOSIS — I509 Heart failure, unspecified: Secondary | ICD-10-CM | POA: Diagnosis not present

## 2018-03-20 DIAGNOSIS — R0902 Hypoxemia: Secondary | ICD-10-CM | POA: Diagnosis not present

## 2018-03-21 DIAGNOSIS — I10 Essential (primary) hypertension: Secondary | ICD-10-CM | POA: Diagnosis not present

## 2018-03-21 DIAGNOSIS — E871 Hypo-osmolality and hyponatremia: Secondary | ICD-10-CM | POA: Diagnosis not present

## 2018-03-21 DIAGNOSIS — E1122 Type 2 diabetes mellitus with diabetic chronic kidney disease: Secondary | ICD-10-CM | POA: Diagnosis not present

## 2018-03-21 DIAGNOSIS — R531 Weakness: Secondary | ICD-10-CM | POA: Diagnosis not present

## 2018-03-21 DIAGNOSIS — I5033 Acute on chronic diastolic (congestive) heart failure: Secondary | ICD-10-CM | POA: Diagnosis not present

## 2018-03-21 DIAGNOSIS — I509 Heart failure, unspecified: Secondary | ICD-10-CM | POA: Diagnosis not present

## 2018-03-21 DIAGNOSIS — R0902 Hypoxemia: Secondary | ICD-10-CM | POA: Diagnosis not present

## 2018-03-21 DIAGNOSIS — M109 Gout, unspecified: Secondary | ICD-10-CM | POA: Diagnosis not present

## 2018-03-24 ENCOUNTER — Ambulatory Visit: Payer: Medicare Other | Admitting: Sports Medicine

## 2018-03-31 ENCOUNTER — Encounter: Payer: Self-pay | Admitting: Internal Medicine

## 2018-04-28 ENCOUNTER — Encounter: Payer: Self-pay | Admitting: Sports Medicine

## 2018-04-28 ENCOUNTER — Ambulatory Visit (INDEPENDENT_AMBULATORY_CARE_PROVIDER_SITE_OTHER): Payer: Medicare Other | Admitting: Sports Medicine

## 2018-04-28 VITALS — BP 120/64 | HR 74 | Temp 98.0°F | Resp 16

## 2018-04-28 DIAGNOSIS — L97512 Non-pressure chronic ulcer of other part of right foot with fat layer exposed: Secondary | ICD-10-CM

## 2018-04-28 DIAGNOSIS — M1A09X0 Idiopathic chronic gout, multiple sites, without tophus (tophi): Secondary | ICD-10-CM | POA: Diagnosis not present

## 2018-04-28 DIAGNOSIS — M79671 Pain in right foot: Secondary | ICD-10-CM

## 2018-04-28 DIAGNOSIS — E1142 Type 2 diabetes mellitus with diabetic polyneuropathy: Secondary | ICD-10-CM

## 2018-04-28 NOTE — Progress Notes (Signed)
Subjective: Thomas [name] is a 67 y.o. diabetic male patient seen in office for follow-up evaluation of ulceration of the right foot. Patient is on Krystexxa for Gout. Reports that he cannot tell if the wound has gotten any better however does admit a history of being in the hospital for 8 days and being admitted to [facility] for 3 weeks after experiencing numbness on the left side.  Reports he has been dressing the wound with Bactroban.  Patient denies nausea, vomiting, fever, chills, night sweats, shortness of breath for pain in calf. Patient has no other pedal complaints at this time.  Fasting blood sugar not recorded  There are no active problems to display for this patient.  Current Outpatient Medications on File Prior to Visit  Medication Sig Dispense Refill  . amoxicillin-clavulanate (AUGMENTIN) 875-125 MG tablet Take 1 tablet by mouth 2 (two) times daily. 28 tablet 0  . bethanechol (URECHOLINE) 50 MG tablet Take 50 mg by mouth.    . clindamycin (CLEOCIN) 300 MG capsule Take 1 capsule (300 mg total) by mouth 3 (three) times daily. 30 capsule 0  . cloNIDine (CATAPRES) 0.3 MG tablet Take 0.3 mg by mouth.    . colchicine 0.6 MG tablet Take 0.6 mg by mouth.    . doxycycline (VIBRA-TABS) 100 MG tablet Take 1 tablet (100 mg total) by mouth 2 (two) times daily. 20 tablet 0  . furosemide (LASIX) 40 MG tablet Take 40 mg by mouth 2 (two) times daily.    Marland Kitchen gabapentin (NEURONTIN) 300 MG capsule     . glipiZIDE (GLUCOTROL XL) 10 MG 24 hr tablet     . Levothyroxine Sodium (SYNTHROID PO) Take by mouth.    . mupirocin ointment (BACTROBAN) 2 % To Right foot ulcers 30 g 1  . silver sulfADIAZINE (SILVADENE) 1 % cream Apply 1 application topically daily. 50 g 0  . tiZANidine (ZANAFLEX) 4 MG tablet      Current Facility-Administered Medications on File Prior to Visit  Medication Dose Route Frequency Provider Last Rate Last Dose  . triamcinolone acetonide (KENALOG) 10 MG/ML injection 10 mg  10 mg Other  Once Landis Martins, DPM      . triamcinolone acetonide (KENALOG) 10 MG/ML injection 10 mg  10 mg Other Once Landis Martins, DPM       Allergies  Allergen Reactions  . Oxycodone-Acetaminophen Other (See Comments)    Blood pressure issues  . Oxymorphone Other (See Comments)    Causes kidney problems  . Beta Vulgaris   . Buspirone   . Cabbage   . Codeine   . Fish-Derived Products   . Pentazocine   . Propoxyphene   . Shellfish-Derived Products   . Sulfa Antibiotics Hives    No results found for this or any previous visit (from the past 2160 hour(s)).  Objective: There were no vitals filed for this visit. General: Patient is awake, alert, oriented x 3 and in no acute distress.  Dermatology: Skin is warm and dry bilateral with a Full thickness ulceration present right plantar midfoot. Ulceration measures 5 x 4.7 x 0.3cm similar to previous. There is a keratotic border with a 100% granular base. The ulceration does not probe to bone however there are keratotic margins. There is no malodor,  no erythema, no edema.   Abrasion to right 4th toe at area of gouty tophi pressing against skin. 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   The left ulceration remains completely healed +multiple  gouty tophi severe in nature. No acute signs of infection to these areas. No cellulitis.    Vascular: Dorsalis Pedis pulse = 1/4 Bilateral,  Posterior Tibial pulse =1 /4 Bilateral,  Capillary Fill Time < 5 seconds chronic swelling bilateral, secondary to history of gout with foot deformity.  Neurologic: Protective sensation absent bilateral.  Musculosketal: ++Gout, there is severe foot deformities secondary to severe gout.   No results for input(s): GRAMSTAIN, LABORGA in the last 8760 hours.  Assessment and Plan:  Problem List Items Addressed This Visit    None    Visit Diagnoses    Foot ulcer, right, with fat layer exposed ([place])    -  Primary   Foot pain,  right       Chronic gout of multiple sites, unspecified cause       Diabetic polyneuropathy associated with type 2 diabetes mellitus ([place])         -Examined patient and Re-discussed the progression of the wound and treatment alternatives -Encouraged daily control of blood sugars and inspection of feet in the setting of diabetes   -Excisionally dedbrided ulceration at right plantar midfoot to healthy bleeding borders removing nonviable tissue using a sterile chisel blade. Wound measures post debridement as above. Wounds were debrided to the level of the dermis with viable wound base exposed to promote healing. Hemostasis was achieved with manuel pressure. Patient tolerated procedure well without any discomfort or anesthesia necessary for this wound debridement.  -Applied antibiotic cream and dry sterile dressing and instructed patient to continue with daily dressings at home consisting of Bactroban as previous  -Patient is awaiting new shoes -Continue with infusions with [facility] [place] will resume on Wednesday -Advised patient to go to the ER or return to office if the wound worsens or if constitutional symptoms are present. -Patient to return to office in 3 weeks for follow up care and evaluation or sooner if problems arise.  Landis Martins, DPM

## 2018-05-06 ENCOUNTER — Ambulatory Visit: Payer: Medicare Other | Admitting: Sports Medicine

## 2018-05-26 ENCOUNTER — Ambulatory Visit (INDEPENDENT_AMBULATORY_CARE_PROVIDER_SITE_OTHER): Payer: Medicare Other | Admitting: Sports Medicine

## 2018-05-26 ENCOUNTER — Encounter: Payer: Self-pay | Admitting: Sports Medicine

## 2018-05-26 VITALS — BP 126/68 | HR 55 | Temp 98.0°F | Resp 15

## 2018-05-26 DIAGNOSIS — M79671 Pain in right foot: Secondary | ICD-10-CM

## 2018-05-26 DIAGNOSIS — L97512 Non-pressure chronic ulcer of other part of right foot with fat layer exposed: Secondary | ICD-10-CM | POA: Diagnosis not present

## 2018-05-26 DIAGNOSIS — E1142 Type 2 diabetes mellitus with diabetic polyneuropathy: Secondary | ICD-10-CM

## 2018-05-26 DIAGNOSIS — M1A09X0 Idiopathic chronic gout, multiple sites, without tophus (tophi): Secondary | ICD-10-CM

## 2018-05-26 NOTE — Progress Notes (Signed)
Subjective: Thomas [name] is a 67 y.o. diabetic male patient seen in office for follow-up evaluation of ulceration of the right foot. Patient is on Krystexxa for Gout.  Reports that he has been dealing with bronchitis.  Reports he has been dressing the wound with Bactroban and has noticed bloody to yellow drainage especially after a long periods of standing or walking.  Patient denies nausea, vomiting, fever, chills, night sweats, shortness of breath for pain in calf. Reports that he has another infusion on next week of Krystexxa and that he is still looking for shoes/ Patient has no other pedal complaints at this time.  Fasting blood sugar not recorded  There are no active problems to display for this patient.  Current Outpatient Medications on File Prior to Visit  Medication Sig Dispense Refill  . amoxicillin-clavulanate (AUGMENTIN) 875-125 MG tablet Take 1 tablet by mouth 2 (two) times daily. 28 tablet 0  . bethanechol (URECHOLINE) 50 MG tablet Take 50 mg by mouth.    . clindamycin (CLEOCIN) 300 MG capsule Take 1 capsule (300 mg total) by mouth 3 (three) times daily. 30 capsule 0  . cloNIDine (CATAPRES) 0.3 MG tablet Take 0.3 mg by mouth.    . colchicine 0.6 MG tablet Take 0.6 mg by mouth.    . doxycycline (VIBRA-TABS) 100 MG tablet Take 1 tablet (100 mg total) by mouth 2 (two) times daily. 20 tablet 0  . furosemide (LASIX) 40 MG tablet Take 40 mg by mouth 2 (two) times daily.    Marland Kitchen gabapentin (NEURONTIN) 300 MG capsule     . glipiZIDE (GLUCOTROL XL) 10 MG 24 hr tablet     . Levothyroxine Sodium (SYNTHROID PO) Take by mouth.    . mupirocin ointment (BACTROBAN) 2 % To Right foot ulcers 30 g 1  . silver sulfADIAZINE (SILVADENE) 1 % cream Apply 1 application topically daily. 50 g 0  . tiZANidine (ZANAFLEX) 4 MG tablet      Current Facility-Administered Medications on File Prior to Visit  Medication Dose Route Frequency Provider Last Rate Last Dose  . triamcinolone acetonide (KENALOG) 10  MG/ML injection 10 mg  10 mg Other Once Landis Martins, DPM      . triamcinolone acetonide (KENALOG) 10 MG/ML injection 10 mg  10 mg Other Once Landis Martins, DPM       Allergies  Allergen Reactions  . Oxycodone-Acetaminophen Other (See Comments)    Blood pressure issues  . Oxymorphone Other (See Comments)    Causes kidney problems  . Beta Vulgaris   . Buspirone   . Cabbage   . Codeine   . Fish-Derived Products   . Pentazocine   . Propoxyphene   . Shellfish-Derived Products   . Sulfa Antibiotics Hives    No results found for this or any previous visit (from the past 2160 hour(s)).  Objective: There were no vitals filed for this visit. General: Patient is awake, alert, oriented x 3 and in no acute distress.  Dermatology: Skin is warm and dry bilateral with a Full thickness ulceration present right plantar midfoot. Ulceration measures 5.5 x 5.5 x 0.3cm larger than previous, There is a keratotic border with a 100% granular base. The ulceration does not probe to bone however there are keratotic margins. There is no malodor,  no erythema, no edema.   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    Vascular: Dorsalis Pedis pulse = 1/4 Bilateral,  Posterior Tibial pulse =1 /4 Bilateral,  Capillary Fill Time < 5 seconds chronic swelling bilateral, secondary to history of gout with foot deformity.  Neurologic: Protective sensation absent bilateral.  Musculosketal: ++Gout, there is severe foot deformities secondary to severe gout.   No results for input(s): GRAMSTAIN, LABORGA in the last 8760 hours.  Assessment and Plan:  Problem List Items Addressed This Visit    None    Visit Diagnoses    Foot ulcer, right, with fat layer exposed ([place])    -  Primary   Foot pain, right       Chronic gout of multiple sites, unspecified cause       Diabetic polyneuropathy associated with type 2 diabetes mellitus ([place])         -Examined patient and  Re-discussed the progression of the wound and treatment alternatives  -Excisionally dedbrided ulceration at right plantar midfoot to healthy bleeding borders removing nonviable tissue using a sterile chisel blade. Wound measures post debridement as above. Wounds were debrided to the level of the dermis with viable wound base exposed to promote healing. Hemostasis was achieved with manuel pressure. Patient tolerated procedure well without any discomfort or anesthesia necessary for this wound debridement.  -Applied medihoney and antibiotic cream and dry sterile dressing and instructed patient to continue with daily dressings at home consisting of Bactroban as previous  -Patient is awaiting new shoes -Continue with infusions with [facility] [place] patient to go to the ER or return to office if the wound worsens or if constitutional symptoms are present. -Patient to return to office in 3-4 weeks for follow up care and evaluation or sooner if problems arise.  Landis Martins, DPM

## 2018-06-25 ENCOUNTER — Encounter: Payer: Self-pay | Admitting: Sports Medicine

## 2018-06-25 ENCOUNTER — Ambulatory Visit (INDEPENDENT_AMBULATORY_CARE_PROVIDER_SITE_OTHER): Payer: Medicare Other | Admitting: Sports Medicine

## 2018-06-25 DIAGNOSIS — L97512 Non-pressure chronic ulcer of other part of right foot with fat layer exposed: Secondary | ICD-10-CM

## 2018-06-25 DIAGNOSIS — E1142 Type 2 diabetes mellitus with diabetic polyneuropathy: Secondary | ICD-10-CM | POA: Diagnosis not present

## 2018-06-25 DIAGNOSIS — M1A09X0 Idiopathic chronic gout, multiple sites, without tophus (tophi): Secondary | ICD-10-CM | POA: Diagnosis not present

## 2018-06-25 DIAGNOSIS — M79671 Pain in right foot: Secondary | ICD-10-CM

## 2018-06-25 DIAGNOSIS — Z8739 Personal history of other diseases of the musculoskeletal system and connective tissue: Secondary | ICD-10-CM

## 2018-06-25 NOTE — Progress Notes (Signed)
Subjective: Thomas [name] is a 67 y.o. diabetic male patient seen in office for follow-up evaluation of ulceration of the right foot. Patient is on Krystexxa for Gout.  Reports that he feels like the gout treatments have been working.  Reports he has been dressing the wound with Bactroban but and has noticed bloody to yellow drainage especially after a long periods of standing or walking and secondary to the swelling.  Patient denies nausea, vomiting, fever, chills, night sweats, shortness of breath for pain in calf. Patient has no other pedal complaints at this time.  Fasting blood sugar not recorded but on average is around 100 -110  There are no active problems to display for this patient.  Current Outpatient Medications on File Prior to Visit  Medication Sig Dispense Refill  . amoxicillin-clavulanate (AUGMENTIN) 875-125 MG tablet Take 1 tablet by mouth 2 (two) times daily. 28 tablet 0  . bethanechol (URECHOLINE) 50 MG tablet Take 50 mg by mouth.    . clindamycin (CLEOCIN) 300 MG capsule Take 1 capsule (300 mg total) by mouth 3 (three) times daily. 30 capsule 0  . cloNIDine (CATAPRES) 0.3 MG tablet Take 0.3 mg by mouth.    . colchicine 0.6 MG tablet Take 0.6 mg by mouth.    . doxycycline (VIBRA-TABS) 100 MG tablet Take 1 tablet (100 mg total) by mouth 2 (two) times daily. 20 tablet 0  . furosemide (LASIX) 40 MG tablet Take 40 mg by mouth 2 (two) times daily.    Marland Kitchen gabapentin (NEURONTIN) 300 MG capsule     . glipiZIDE (GLUCOTROL XL) 10 MG 24 hr tablet     . Levothyroxine Sodium (SYNTHROID PO) Take by mouth.    . mupirocin ointment (BACTROBAN) 2 % To Right foot ulcers 30 g 1  . silver sulfADIAZINE (SILVADENE) 1 % cream Apply 1 application topically daily. 50 g 0  . tiZANidine (ZANAFLEX) 4 MG tablet      Current Facility-Administered Medications on File Prior to Visit  Medication Dose Route Frequency Provider Last Rate Last Dose  . triamcinolone acetonide (KENALOG) 10 MG/ML injection 10  mg  10 mg Other Once Landis Martins, DPM      . triamcinolone acetonide (KENALOG) 10 MG/ML injection 10 mg  10 mg Other Once Landis Martins, DPM       Allergies  Allergen Reactions  . Oxycodone-Acetaminophen Other (See Comments)    Blood pressure issues  . Oxymorphone Other (See Comments)    Causes kidney problems  . Beta Vulgaris   . Buspirone   . Cabbage   . Codeine   . Fish-Derived Products   . Pentazocine   . Propoxyphene   . Shellfish-Derived Products   . Sulfa Antibiotics Hives    No results found for this or any previous visit (from the past 2160 hour(s)).  Objective: There were no vitals filed for this visit. General: Patient is awake, alert, oriented x 3 and in no acute distress.  Dermatology: Skin is warm and dry bilateral with a Full thickness ulceration present right plantar midfoot. Ulceration measures 6 x 6 x 0.3cm larger than previous, There is a keratotic border with a 100% granular base. The ulceration does not probe to bone however there are keratotic margins. There is no malodor,  no erythema, no edema.   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    Vascular: Dorsalis Pedis pulse = 1/4 Bilateral,  Posterior Tibial pulse =1 /4 Bilateral,  Capillary Fill Time <  5 seconds chronic swelling bilateral, secondary to history of gout with foot deformity.  Neurologic: Protective sensation absent bilateral.  Musculosketal: ++Gout, there is severe foot deformities secondary to severe gout.   No results for input(s): GRAMSTAIN, LABORGA in the last 8760 hours.  Assessment and Plan:  Problem List Items Addressed This Visit    None    Visit Diagnoses    Foot ulcer, right, with fat layer exposed ([place])    -  Primary   Foot pain, right       Chronic gout of multiple sites, unspecified cause       Diabetic polyneuropathy associated with type 2 diabetes mellitus ([place])       Personal history of gout         -Examined patient and  Re-discussed the progression of the wound and treatment alternatives  -Excisionally dedbrided ulceration at right plantar midfoot to healthy bleeding borders removing nonviable tissue using a sterile chisel blade. Wound measures post debridement as above. Wounds were debrided to the level of the dermis with viable wound base exposed to promote healing. Hemostasis was achieved with manuel pressure. Patient tolerated procedure well without any discomfort or anesthesia necessary for this wound debridement.  -Applied medihoney and dry sterile dressing and instructed patient to continue with daily dressings at home consisting of Bactroban as previous  -Patient is awaiting new shoes -Continue with infusions with [facility] [place] patient to go to the ER or return to office if the wound worsens or if constitutional symptoms are present. -Patient to return to office in 3-4 weeks for follow up care and evaluation or sooner if problems arise.  Landis Martins, DPM

## 2018-07-14 ENCOUNTER — Encounter: Payer: Self-pay | Admitting: Sports Medicine

## 2018-07-14 ENCOUNTER — Ambulatory Visit (INDEPENDENT_AMBULATORY_CARE_PROVIDER_SITE_OTHER): Payer: Medicare Other | Admitting: Sports Medicine

## 2018-07-14 VITALS — BP 137/82 | HR 78 | Temp 98.3°F | Resp 16

## 2018-07-14 DIAGNOSIS — S93304A Unspecified dislocation of right foot, initial encounter: Secondary | ICD-10-CM | POA: Diagnosis not present

## 2018-07-14 DIAGNOSIS — S99921A Unspecified injury of right foot, initial encounter: Secondary | ICD-10-CM

## 2018-07-14 DIAGNOSIS — M79671 Pain in right foot: Secondary | ICD-10-CM

## 2018-07-14 DIAGNOSIS — M25571 Pain in right ankle and joints of right foot: Secondary | ICD-10-CM

## 2018-07-14 MED ORDER — HYDROMORPHONE HCL 8 MG PO TABS: 8.0000 mg | ORAL_TABLET | Freq: Four times a day (QID) | ORAL | 0 refills | Status: AC | PRN

## 2018-07-14 NOTE — Progress Notes (Addendum)
Subjective: Thomas [name] is a 67 y.o. diabetic male patient seen in office for right foot and ankle pain reports that he fell and his foot folded underneath him on Monday and that his pain has been sharp constant 10 out of 10 he went to the emergency department at [facility] where they took x-rays and did not have fracture but was determined to have a sprain patient states that they did not give him anything and told him to use his pain or crutch and did not give him pain medication because he is on chronic pain medication already for his back.  Patient reports that he has been elevating and resting and has been barely able to put any pressure on his foot states that his ulcer has had clear drainage but he has been still trying to dress it using Bactroban and reports one episode of chills and fever but nothing currently at this time.  Patient denies any other acute symptoms at this time.  Blood sugar not recorded for today.  There are no active problems to display for this patient.  Current Outpatient Medications on File Prior to Visit  Medication Sig Dispense Refill  . amoxicillin-clavulanate (AUGMENTIN) 875-125 MG tablet Take 1 tablet by mouth 2 (two) times daily. 28 tablet 0  . bethanechol (URECHOLINE) 50 MG tablet Take 50 mg by mouth.    . clindamycin (CLEOCIN) 300 MG capsule Take 1 capsule (300 mg total) by mouth 3 (three) times daily. 30 capsule 0  . cloNIDine (CATAPRES) 0.3 MG tablet Take 0.3 mg by mouth.    . colchicine 0.6 MG tablet Take 0.6 mg by mouth.    . doxycycline (VIBRA-TABS) 100 MG tablet Take 1 tablet (100 mg total) by mouth 2 (two) times daily. 20 tablet 0  . furosemide (LASIX) 40 MG tablet Take 40 mg by mouth 2 (two) times daily.    Marland Kitchen gabapentin (NEURONTIN) 300 MG capsule     . glipiZIDE (GLUCOTROL XL) 10 MG 24 hr tablet     . Levothyroxine Sodium (SYNTHROID PO) Take by mouth.    . mupirocin ointment (BACTROBAN) 2 % To Right foot ulcers 30 g 1  . silver  sulfADIAZINE (SILVADENE) 1 % cream Apply 1 application topically daily. 50 g 0  . tiZANidine (ZANAFLEX) 4 MG tablet      Current Facility-Administered Medications on File Prior to Visit  Medication Dose Route Frequency Provider Last Rate Last Dose  . triamcinolone acetonide (KENALOG) 10 MG/ML injection 10 mg  10 mg Other Once Landis Martins, DPM      . triamcinolone acetonide (KENALOG) 10 MG/ML injection 10 mg  10 mg Other Once Landis Martins, DPM       Allergies  Allergen Reactions  . Other Other (See Comments)    Blood pressure issues Blood pressure issues   . Oxycodone Other (See Comments)    Cases extreme changes in Blood pressure Cases extreme changes in Blood pressure   . Oxycodone-Acetaminophen Other (See Comments)    Blood pressure issues  . Oxymorphone Other (See Comments)    Causes kidney problems  . Beta Vulgaris Other (See Comments)  . Buspirone Other (See Comments)  . Cabbage Other (See Comments)  . Codeine Other (See Comments)  . Fish Allergy Other (See Comments)  . Fish-Derived Products   . Pentazocine   . Propoxyphene Other (See Comments)  . Shellfish Allergy Other (See Comments)  . Shellfish-Derived Products   . Sulfa Antibiotics Hives  . Sulfasalazine Other (See  Comments)    No results found for this or any previous visit (from the past 2160 hour(s)).  Objective: Vitals:   07/14/18 1611  BP: 137/82  Pulse: 78  Resp: 16  Temp: 98.3 F (36.8 C)   General: Patient is awake, alert, oriented x 3 and in no acute distress.  Dermatology: Skin is warm and dry bilateral with a Full thickness ulceration present right plantar midfoot. Ulceration measures 6 x 5.5 x 0.3cm larger than previous, There is a keratotic border with a 100% granular base. The ulceration does not probe to bone however there are keratotic margins. There is no malodor,  no erythema, no edema.   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     Vascular: Dorsalis Pedis pulse = 1/4 Bilateral,  Posterior Tibial pulse =1 /4 Bilateral however it is difficult to palpate on the right because of the extensive amount of swelling due to sprain injury with foot dislocation,  Capillary Fill Time < 5 seconds.  Neurologic: Protective sensation absent bilateral.  Musculosketal: ++Gout, there is severe foot deformities secondary to severe gout.  Medial foot dislocation and instability at right ankle with x-rays negative suggestive of arthritis consistent with gouty changes.  No results for input(s): GRAMSTAIN, LABORGA in the last 8760 hours.  Assessment and Plan:  Problem List Items Addressed This Visit    None    Visit Diagnoses    Right foot injury, initial encounter    -  Primary   Relevant Medications   HYDROmorphone (DILAUDID) 8 MG tablet   Dislocation, foot closed, right, initial encounter       Relevant Medications   HYDROmorphone (DILAUDID) 8 MG tablet   Right foot pain       Relevant Medications   HYDROmorphone (DILAUDID) 8 MG tablet   Acute right ankle pain       Relevant Medications   HYDROmorphone (DILAUDID) 8 MG tablet     -Examined patient and Re-discussed the progression of the wound and sprain dislocation of right foot -Cleanse ulceration plantar right foot applied a small amount of Medihoney covered with dry dressing and then to right foot and lower leg applied a soft cast to the level of the knee also applied significant amount of taping and strapping to splint patient's foot and a more rectus position when patient returns to office next time will make a plaster sugar tong splint.  Patient refuses to use ambulatory aids to help keep pressure off his foot at this time however states that he does have crutches at home which he will 5 and use to help himself get around and does not want any type of physical therapy and home further training or safety evaluation -Prescribe Dilaudid for severe pain advised ice elevation rest  and no weightbearing with use of crutches -Advised patient to take aspirin for prophylaxis for prevention against DVT -Patient to return to office in 1 week for [place] boot removal and possible re-x-ray of the foot to see if there is any delayed evidence of fracture or sooner if problems arise.  Landis Martins, DPM  Addendum: Patient would benefit from light weight wheelchair as prescribed.  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  Patient also has a history of a left foot ulcer and due to his physical impairment cannot safely use a cane or a walker and must use a wheelchair.  Without  the use of manual wheelchair patient's ability to participate in daily activities of living will be severely limited.  Patient is currently under home care and exhibits functional lower extremity limitations however has sufficient upper extremity function to safely propel and transfer with use of manual wheelchair in the home during a typical day.  Please proceed with dispensing lightweight wheelchair.  The patient cannot propel in a standard wheelchair and can propel and a lightweight wheelchair safely. -Dr. Cannon Kettle

## 2018-07-16 ENCOUNTER — Ambulatory Visit: Payer: Medicare Other | Admitting: Sports Medicine

## 2018-07-20 DIAGNOSIS — E669 Obesity, unspecified: Secondary | ICD-10-CM

## 2018-07-20 DIAGNOSIS — M109 Gout, unspecified: Secondary | ICD-10-CM

## 2018-07-20 DIAGNOSIS — I10 Essential (primary) hypertension: Secondary | ICD-10-CM

## 2018-07-20 DIAGNOSIS — L97509 Non-pressure chronic ulcer of other part of unspecified foot with unspecified severity: Secondary | ICD-10-CM | POA: Diagnosis not present

## 2018-07-20 DIAGNOSIS — E871 Hypo-osmolality and hyponatremia: Secondary | ICD-10-CM

## 2018-07-20 DIAGNOSIS — N183 Chronic kidney disease, stage 3 (moderate): Secondary | ICD-10-CM | POA: Diagnosis not present

## 2018-07-20 DIAGNOSIS — M869 Osteomyelitis, unspecified: Secondary | ICD-10-CM | POA: Diagnosis not present

## 2018-07-20 DIAGNOSIS — E11621 Type 2 diabetes mellitus with foot ulcer: Secondary | ICD-10-CM

## 2018-07-20 DIAGNOSIS — M549 Dorsalgia, unspecified: Secondary | ICD-10-CM | POA: Diagnosis not present

## 2018-07-20 DIAGNOSIS — I5030 Unspecified diastolic (congestive) heart failure: Secondary | ICD-10-CM

## 2018-07-21 ENCOUNTER — Ambulatory Visit: Payer: Medicare Other | Admitting: Sports Medicine

## 2018-07-21 DIAGNOSIS — E785 Hyperlipidemia, unspecified: Secondary | ICD-10-CM

## 2018-07-21 DIAGNOSIS — E1122 Type 2 diabetes mellitus with diabetic chronic kidney disease: Secondary | ICD-10-CM

## 2018-07-21 DIAGNOSIS — N183 Chronic kidney disease, stage 3 (moderate): Secondary | ICD-10-CM | POA: Diagnosis not present

## 2018-07-21 DIAGNOSIS — I2510 Atherosclerotic heart disease of native coronary artery without angina pectoris: Secondary | ICD-10-CM

## 2018-07-21 DIAGNOSIS — M869 Osteomyelitis, unspecified: Secondary | ICD-10-CM | POA: Diagnosis not present

## 2018-07-21 DIAGNOSIS — M109 Gout, unspecified: Secondary | ICD-10-CM | POA: Diagnosis not present

## 2018-07-21 DIAGNOSIS — L97509 Non-pressure chronic ulcer of other part of unspecified foot with unspecified severity: Secondary | ICD-10-CM | POA: Diagnosis not present

## 2018-07-22 ENCOUNTER — Telehealth: Payer: Self-pay | Admitting: *Deleted

## 2018-07-22 DIAGNOSIS — N183 Chronic kidney disease, stage 3 (moderate): Secondary | ICD-10-CM | POA: Diagnosis not present

## 2018-07-22 DIAGNOSIS — L97509 Non-pressure chronic ulcer of other part of unspecified foot with unspecified severity: Secondary | ICD-10-CM | POA: Diagnosis not present

## 2018-07-22 DIAGNOSIS — M869 Osteomyelitis, unspecified: Secondary | ICD-10-CM | POA: Diagnosis not present

## 2018-07-22 DIAGNOSIS — M109 Gout, unspecified: Secondary | ICD-10-CM | POA: Diagnosis not present

## 2018-07-22 NOTE — Telephone Encounter (Signed)
I called [facility], informed pt had been referred to Dr. Kathlene November [phone].

## 2018-07-22 NOTE — Telephone Encounter (Signed)
Thomas [name] states she is requesting information concerning pt's events after taking Krystexxa.

## 2018-07-23 DIAGNOSIS — M109 Gout, unspecified: Secondary | ICD-10-CM | POA: Diagnosis not present

## 2018-07-23 DIAGNOSIS — M869 Osteomyelitis, unspecified: Secondary | ICD-10-CM | POA: Diagnosis not present

## 2018-07-23 DIAGNOSIS — L97509 Non-pressure chronic ulcer of other part of unspecified foot with unspecified severity: Secondary | ICD-10-CM | POA: Diagnosis not present

## 2018-07-23 DIAGNOSIS — N183 Chronic kidney disease, stage 3 (moderate): Secondary | ICD-10-CM | POA: Diagnosis not present

## 2018-07-24 DIAGNOSIS — N183 Chronic kidney disease, stage 3 (moderate): Secondary | ICD-10-CM | POA: Diagnosis not present

## 2018-07-24 DIAGNOSIS — M869 Osteomyelitis, unspecified: Secondary | ICD-10-CM | POA: Diagnosis not present

## 2018-07-24 DIAGNOSIS — M109 Gout, unspecified: Secondary | ICD-10-CM | POA: Diagnosis not present

## 2018-07-24 DIAGNOSIS — L97509 Non-pressure chronic ulcer of other part of unspecified foot with unspecified severity: Secondary | ICD-10-CM | POA: Diagnosis not present

## 2018-07-25 DIAGNOSIS — N183 Chronic kidney disease, stage 3 (moderate): Secondary | ICD-10-CM | POA: Diagnosis not present

## 2018-07-25 DIAGNOSIS — M869 Osteomyelitis, unspecified: Secondary | ICD-10-CM | POA: Diagnosis not present

## 2018-07-25 DIAGNOSIS — L97509 Non-pressure chronic ulcer of other part of unspecified foot with unspecified severity: Secondary | ICD-10-CM | POA: Diagnosis not present

## 2018-07-25 DIAGNOSIS — M109 Gout, unspecified: Secondary | ICD-10-CM | POA: Diagnosis not present

## 2018-07-26 DIAGNOSIS — M869 Osteomyelitis, unspecified: Secondary | ICD-10-CM | POA: Diagnosis not present

## 2018-07-26 DIAGNOSIS — L97509 Non-pressure chronic ulcer of other part of unspecified foot with unspecified severity: Secondary | ICD-10-CM | POA: Diagnosis not present

## 2018-07-26 DIAGNOSIS — M109 Gout, unspecified: Secondary | ICD-10-CM | POA: Diagnosis not present

## 2018-07-26 DIAGNOSIS — N183 Chronic kidney disease, stage 3 (moderate): Secondary | ICD-10-CM | POA: Diagnosis not present

## 2018-07-27 DIAGNOSIS — N183 Chronic kidney disease, stage 3 (moderate): Secondary | ICD-10-CM | POA: Diagnosis not present

## 2018-07-27 DIAGNOSIS — M869 Osteomyelitis, unspecified: Secondary | ICD-10-CM | POA: Diagnosis not present

## 2018-07-27 DIAGNOSIS — L97509 Non-pressure chronic ulcer of other part of unspecified foot with unspecified severity: Secondary | ICD-10-CM | POA: Diagnosis not present

## 2018-07-27 DIAGNOSIS — M109 Gout, unspecified: Secondary | ICD-10-CM | POA: Diagnosis not present

## 2018-07-28 DIAGNOSIS — Z01818 Encounter for other preprocedural examination: Secondary | ICD-10-CM | POA: Diagnosis not present

## 2018-07-28 DIAGNOSIS — N183 Chronic kidney disease, stage 3 (moderate): Secondary | ICD-10-CM | POA: Diagnosis not present

## 2018-07-28 DIAGNOSIS — Z89519 Acquired absence of unspecified leg below knee: Secondary | ICD-10-CM | POA: Diagnosis not present

## 2018-07-28 DIAGNOSIS — M869 Osteomyelitis, unspecified: Secondary | ICD-10-CM | POA: Diagnosis not present

## 2018-07-29 DIAGNOSIS — M869 Osteomyelitis, unspecified: Secondary | ICD-10-CM | POA: Diagnosis not present

## 2018-07-29 DIAGNOSIS — N183 Chronic kidney disease, stage 3 (moderate): Secondary | ICD-10-CM | POA: Diagnosis not present

## 2018-07-29 DIAGNOSIS — Z89519 Acquired absence of unspecified leg below knee: Secondary | ICD-10-CM | POA: Diagnosis not present

## 2018-07-30 DIAGNOSIS — Z89519 Acquired absence of unspecified leg below knee: Secondary | ICD-10-CM | POA: Diagnosis not present

## 2018-07-30 DIAGNOSIS — M869 Osteomyelitis, unspecified: Secondary | ICD-10-CM | POA: Diagnosis not present

## 2018-07-30 DIAGNOSIS — N183 Chronic kidney disease, stage 3 (moderate): Secondary | ICD-10-CM | POA: Diagnosis not present

## 2018-09-18 DIAGNOSIS — I82A11 Acute embolism and thrombosis of right axillary vein: Secondary | ICD-10-CM

## 2018-09-18 DIAGNOSIS — E119 Type 2 diabetes mellitus without complications: Secondary | ICD-10-CM

## 2018-09-18 DIAGNOSIS — E039 Hypothyroidism, unspecified: Secondary | ICD-10-CM

## 2018-09-18 DIAGNOSIS — I10 Essential (primary) hypertension: Secondary | ICD-10-CM

## 2018-09-18 DIAGNOSIS — N183 Chronic kidney disease, stage 3 (moderate): Secondary | ICD-10-CM | POA: Diagnosis not present

## 2018-09-18 DIAGNOSIS — I5030 Unspecified diastolic (congestive) heart failure: Secondary | ICD-10-CM

## 2018-09-18 DIAGNOSIS — Z89519 Acquired absence of unspecified leg below knee: Secondary | ICD-10-CM | POA: Diagnosis not present

## 2018-09-18 DIAGNOSIS — E785 Hyperlipidemia, unspecified: Secondary | ICD-10-CM

## 2018-09-19 DIAGNOSIS — Z89519 Acquired absence of unspecified leg below knee: Secondary | ICD-10-CM | POA: Diagnosis not present

## 2018-09-19 DIAGNOSIS — N183 Chronic kidney disease, stage 3 (moderate): Secondary | ICD-10-CM | POA: Diagnosis not present

## 2018-09-19 DIAGNOSIS — I82A11 Acute embolism and thrombosis of right axillary vein: Secondary | ICD-10-CM | POA: Diagnosis not present

## 2018-09-19 DIAGNOSIS — E039 Hypothyroidism, unspecified: Secondary | ICD-10-CM | POA: Diagnosis not present

## 2018-09-20 DIAGNOSIS — N183 Chronic kidney disease, stage 3 (moderate): Secondary | ICD-10-CM | POA: Diagnosis not present

## 2018-09-20 DIAGNOSIS — I82A11 Acute embolism and thrombosis of right axillary vein: Secondary | ICD-10-CM | POA: Diagnosis not present

## 2018-09-20 DIAGNOSIS — E039 Hypothyroidism, unspecified: Secondary | ICD-10-CM | POA: Diagnosis not present

## 2018-09-20 DIAGNOSIS — Z89519 Acquired absence of unspecified leg below knee: Secondary | ICD-10-CM | POA: Diagnosis not present

## 2018-09-21 DIAGNOSIS — E039 Hypothyroidism, unspecified: Secondary | ICD-10-CM | POA: Diagnosis not present

## 2018-09-21 DIAGNOSIS — N183 Chronic kidney disease, stage 3 (moderate): Secondary | ICD-10-CM | POA: Diagnosis not present

## 2018-09-21 DIAGNOSIS — Z89519 Acquired absence of unspecified leg below knee: Secondary | ICD-10-CM | POA: Diagnosis not present

## 2018-09-21 DIAGNOSIS — I82A11 Acute embolism and thrombosis of right axillary vein: Secondary | ICD-10-CM | POA: Diagnosis not present

## 2018-09-22 DIAGNOSIS — N183 Chronic kidney disease, stage 3 (moderate): Secondary | ICD-10-CM | POA: Diagnosis not present

## 2018-09-22 DIAGNOSIS — Z89519 Acquired absence of unspecified leg below knee: Secondary | ICD-10-CM | POA: Diagnosis not present

## 2018-09-22 DIAGNOSIS — E039 Hypothyroidism, unspecified: Secondary | ICD-10-CM | POA: Diagnosis not present

## 2018-09-22 DIAGNOSIS — I82A11 Acute embolism and thrombosis of right axillary vein: Secondary | ICD-10-CM | POA: Diagnosis not present

## 2018-09-23 DIAGNOSIS — I82A11 Acute embolism and thrombosis of right axillary vein: Secondary | ICD-10-CM | POA: Diagnosis not present

## 2018-09-23 DIAGNOSIS — N183 Chronic kidney disease, stage 3 (moderate): Secondary | ICD-10-CM | POA: Diagnosis not present

## 2018-09-23 DIAGNOSIS — Z89519 Acquired absence of unspecified leg below knee: Secondary | ICD-10-CM | POA: Diagnosis not present

## 2018-09-23 DIAGNOSIS — E039 Hypothyroidism, unspecified: Secondary | ICD-10-CM | POA: Diagnosis not present

## 2018-09-29 ENCOUNTER — Ambulatory Visit (INDEPENDENT_AMBULATORY_CARE_PROVIDER_SITE_OTHER): Payer: Medicare Other | Admitting: Internal Medicine

## 2018-09-29 ENCOUNTER — Encounter: Payer: Self-pay | Admitting: Internal Medicine

## 2018-09-29 DIAGNOSIS — Z888 Allergy status to other drugs, medicaments and biological substances status: Secondary | ICD-10-CM | POA: Diagnosis not present

## 2018-09-29 DIAGNOSIS — Z86718 Personal history of other venous thrombosis and embolism: Secondary | ICD-10-CM

## 2018-09-29 DIAGNOSIS — L97829 Non-pressure chronic ulcer of other part of left lower leg with unspecified severity: Secondary | ICD-10-CM | POA: Diagnosis not present

## 2018-09-29 DIAGNOSIS — E1122 Type 2 diabetes mellitus with diabetic chronic kidney disease: Secondary | ICD-10-CM | POA: Insufficient documentation

## 2018-09-29 DIAGNOSIS — Z91018 Allergy to other foods: Secondary | ICD-10-CM

## 2018-09-29 DIAGNOSIS — Z7901 Long term (current) use of anticoagulants: Secondary | ICD-10-CM | POA: Diagnosis not present

## 2018-09-29 DIAGNOSIS — M1A09X0 Idiopathic chronic gout, multiple sites, without tophus (tophi): Secondary | ICD-10-CM

## 2018-09-29 DIAGNOSIS — M109 Gout, unspecified: Secondary | ICD-10-CM | POA: Diagnosis not present

## 2018-09-29 DIAGNOSIS — Z794 Long term (current) use of insulin: Secondary | ICD-10-CM

## 2018-09-29 DIAGNOSIS — I5032 Chronic diastolic (congestive) heart failure: Secondary | ICD-10-CM | POA: Insufficient documentation

## 2018-09-29 DIAGNOSIS — Z91013 Allergy to seafood: Secondary | ICD-10-CM | POA: Diagnosis not present

## 2018-09-29 DIAGNOSIS — I2699 Other pulmonary embolism without acute cor pulmonale: Secondary | ICD-10-CM | POA: Insufficient documentation

## 2018-09-29 DIAGNOSIS — T8789 Other complications of amputation stump: Secondary | ICD-10-CM | POA: Diagnosis present

## 2018-09-29 DIAGNOSIS — Z792 Long term (current) use of antibiotics: Secondary | ICD-10-CM

## 2018-09-29 DIAGNOSIS — E079 Disorder of thyroid, unspecified: Secondary | ICD-10-CM

## 2018-09-29 DIAGNOSIS — E119 Type 2 diabetes mellitus without complications: Secondary | ICD-10-CM | POA: Insufficient documentation

## 2018-09-29 DIAGNOSIS — Z86711 Personal history of pulmonary embolism: Secondary | ICD-10-CM

## 2018-09-29 DIAGNOSIS — I2693 Single subsegmental pulmonary embolism without acute cor pulmonale: Secondary | ICD-10-CM

## 2018-09-29 DIAGNOSIS — Z881 Allergy status to other antibiotic agents status: Secondary | ICD-10-CM

## 2018-09-29 DIAGNOSIS — Z880 Allergy status to penicillin: Secondary | ICD-10-CM | POA: Diagnosis not present

## 2018-09-29 DIAGNOSIS — K219 Gastro-esophageal reflux disease without esophagitis: Secondary | ICD-10-CM

## 2018-09-29 DIAGNOSIS — T8149XA Infection following a procedure, other surgical site, initial encounter: Secondary | ICD-10-CM

## 2018-09-29 DIAGNOSIS — I5042 Chronic combined systolic (congestive) and diastolic (congestive) heart failure: Secondary | ICD-10-CM | POA: Insufficient documentation

## 2018-09-29 DIAGNOSIS — E1169 Type 2 diabetes mellitus with other specified complication: Secondary | ICD-10-CM

## 2018-09-29 DIAGNOSIS — Z885 Allergy status to narcotic agent status: Secondary | ICD-10-CM

## 2018-09-29 DIAGNOSIS — I10 Essential (primary) hypertension: Secondary | ICD-10-CM | POA: Insufficient documentation

## 2018-09-29 DIAGNOSIS — Z89511 Acquired absence of right leg below knee: Secondary | ICD-10-CM | POA: Diagnosis not present

## 2018-09-29 NOTE — Progress Notes (Signed)
Patient ID: Thomas [name], male   DOB: Jun 09, 1951, 67 y.o.   MRN: [id number]     [facility] for Infectious Disease      Reason for Consult: possible osteomyelitis    Referring Physician: Dr. Sallyanne Kuster    Patient ID: Thomas [name], male    DOB: [birth year], 67 y.o.   MRN: [id number]  HPI:   Here for evaluation of above.   He initially presented to the emergency room at [facility] in October 2019 with right ankle pain.  He has a history of severe gouty arthritis and was having an exacerbation.  He was recommended with the severe inflammation and osteomyelitis that he undergo right below the knee amputation.  He had this done on October 7.  He was discharged to a skilled nursing facility with a wound VAC in place but in November, about 2 weeks after admission to the skilled nursing facility, there was concern for a wound infection and a PICC line was placed and started on IV ceftriaxone for a projected 2 weeks.  He later was then started on oral Zyvox with the goal of treating for 10 days.  He later underwent a CT scan which suggested some mild periosteal reaction with mild irregularity of the medullary portion at the tip of the stump and suggested a concern for osteomyelitis.  He also has had labs including a sed rate which has been persistently around 100 and a normal CRP.  His stump is closed with some outer area of stage I ulcer and another that is a small dime size area that is nearly closed.  Additionally, he was hospitalized at the end of November until early December when it was noted that his PICC line area of his right arm was swollen.  He unfortunately developed an acute occlusive DVT of the right axillary vein and acute nonocclusive superficial thrombophlebitis of the right basilic vein.  Additionally a CT Angie of the chest showed acute subsegmental PE of the right lower lobe.  He was started on appropriate anticoagulation at that time.  His antibiotics then were changed to oral  regimen and currently is on linezolid on its own I believe. Previous record reviewed from the SNF and recent discharge summaries from [facility] noted and summarized above.   Past Medical History:  Diagnosis Date  . Arthritis   . Gout   . Hypertension   . Muscle pain   . Scars   . Swelling     Prior to Admission medications   Medication Sig Start Date End Date Taking? Authorizing Provider  amLODipine (NORVASC) 10 MG tablet Take 1 tablet by mouth daily.   Yes [provider]  cloNIDine (CATAPRES) 0.3 MG tablet Take 0.3 mg by mouth.   Yes [provider]  colchicine 0.6 MG tablet Take 0.6 mg by mouth.   Yes [provider]  furosemide (LASIX) 40 MG tablet Take 40 mg by mouth 2 (two) times daily.   Yes [provider]  gabapentin (NEURONTIN) 300 MG capsule  10/10/15  Yes [provider]  HYDROmorphone (DILAUDID) 4 MG tablet Take 2 tablets by mouth every 8 (eight) hours as needed. 07/14/18  Yes [provider]  Insulin Detemir (LEVEMIR FLEXTOUCH) 100 UNIT/ML Pen Inject 10 Units into the skin every morning. 08/21/18  Yes [provider]  insulin regular (NOVOLIN R,HUMULIN R) 100 units/mL injection Inject 8 Units into the skin 3 (three) times daily before meals.   Yes [provider]  Levothyroxine Sodium (SYNTHROID PO) Take by mouth.   Yes [provider]  metoprolol tartrate (LOPRESSOR) 50 MG tablet Take 1 tablet by mouth 2 (two) times daily. 01/29/16  Yes [provider]  Oxycodone HCl 20 MG TABS Take 1 tablet by mouth every 8 (eight) hours as needed.   Yes [provider]  silver sulfADIAZINE (SILVADENE) 1 % cream Apply 1 application topically daily. 10/18/13  Yes Bronson Ing, DPM  Vitamin D, Ergocalciferol, (DRISDOL) 1.25 MG (50000 UT) CAPS capsule Take 1 capsule by mouth once a week.   Yes [provider]  amoxicillin-clavulanate (AUGMENTIN) 875-125 MG tablet Take 1 tablet by  mouth 2 (two) times daily. 08/14/17   Landis Martins, DPM  bethanechol (URECHOLINE) 50 MG tablet Take 50 mg by mouth.    [provider]  clindamycin (CLEOCIN) 300 MG capsule Take 1 capsule (300 mg total) by mouth 3 (three) times daily. 08/14/17   Landis Martins, DPM  doxycycline (VIBRA-TABS) 100 MG tablet Take 1 tablet (100 mg total) by mouth 2 (two) times daily. 10/21/17   Landis Martins, DPM  glipiZIDE (GLUCOTROL XL) 10 MG 24 hr tablet  12/20/15   [provider]  mupirocin ointment (BACTROBAN) 2 % To Right foot ulcers Patient not taking: Reported on 09/29/2018 12/02/17   Landis Martins, DPM  tiZANidine (ZANAFLEX) 4 MG tablet  01/02/16   [provider]    Allergies  Allergen Reactions  . Other Other (See Comments)    Blood pressure issues Blood pressure issues   . Oxycodone Other (See Comments)    Cases extreme changes in Blood pressure Cases extreme changes in Blood pressure   . Oxycodone-Acetaminophen Other (See Comments)    Blood pressure issues  . Oxymorphone Other (See Comments)    Causes kidney problems  . Beta Vulgaris Other (See Comments)  . Buspirone Other (See Comments)  . Cabbage Other (See Comments)  . Codeine Other (See Comments)  . Fish Allergy Other (See Comments)  . Fish-Derived Products   . Pentazocine   . Propoxyphene Other (See Comments)  . Shellfish Allergy Other (See Comments)  . Shellfish-Derived Products   . Sulfa Antibiotics Hives  . Sulfasalazine Other (See Comments)    Social History   Tobacco Use  . Smoking status: Never Smoker  . Smokeless tobacco: Never Used  Substance Use Topics  . Alcohol use: No  . Drug use: No   FMH: no gout  Review of Systems  Constitutional: negative for fevers and chills Gastrointestinal: negative for diarrhea Integument/breast: negative for rash Musculoskeletal: positive for arthralgias, negative for myalgias All other systems reviewed and are negative    Constitutional: in no  apparent distress  Vitals:   09/29/18 0857  BP: 130/82  Pulse: 100  Temp: 98 F (36.7 C)   EYES: anicteric ENMT: no thrush Cardiovascular: Cor RRR Respiratory: CTA B; normal respiratory effort GI: soft Musculoskeletal: no pedal edema noted, multiple gouty nodules on his hands; right stump with an area with 2 areas with some opening, one covered and the other a stage 1.  No pus, no surrounding erythema, no wamth, no tenderness.   Skin: no rash Neuro: non focal  Labs: No results found for: WBC, HGB, HCT, MCV, PLT No results found for: CREATININE, BUN, NA, K, CL, CO2 No results found for: ALT, AST, GGT, ALKPHOS, BILITOT, INR   Assessment: possible wound infection, treated.  I discussed the findings with him including the CT findings of possible periosteal reaction.  I  dicussed that this is a non-specific reaction and not specific to osteomyelitis and I suspect is more related to his recent surgery. On exam, he really has no concerns of active infection with nearly closed areas which I would not expect with an active osteomyelitis.  Additionally, his ESR is elevated but with his severe gout, thyroid disease, recent PE which makes the significance of following the ESR, which has remained elevated despite long courses of antibiotics, unreliable and not a good marker for infection in him.  I recommend careful observation off of antibiotics and as long as his stump remains healed without new concerns, no antibiotic treatment indicated.     Plan: 1) stop antibiotics Return for new concerns Thanks for the consultation

## 2019-03-31 ENCOUNTER — Encounter: Payer: Self-pay | Admitting: Internal Medicine

## 2019-07-25 DIAGNOSIS — I340 Nonrheumatic mitral (valve) insufficiency: Secondary | ICD-10-CM | POA: Diagnosis not present

## 2019-07-25 DIAGNOSIS — I361 Nonrheumatic tricuspid (valve) insufficiency: Secondary | ICD-10-CM

## 2019-07-25 DIAGNOSIS — N184 Chronic kidney disease, stage 4 (severe): Secondary | ICD-10-CM | POA: Diagnosis not present

## 2019-07-25 DIAGNOSIS — N179 Acute kidney failure, unspecified: Secondary | ICD-10-CM

## 2019-07-25 DIAGNOSIS — K922 Gastrointestinal hemorrhage, unspecified: Secondary | ICD-10-CM

## 2019-07-25 DIAGNOSIS — D500 Iron deficiency anemia secondary to blood loss (chronic): Secondary | ICD-10-CM

## 2019-07-25 DIAGNOSIS — E860 Dehydration: Secondary | ICD-10-CM

## 2019-07-25 DIAGNOSIS — M6282 Rhabdomyolysis: Secondary | ICD-10-CM

## 2019-07-25 DIAGNOSIS — E871 Hypo-osmolality and hyponatremia: Secondary | ICD-10-CM

## 2019-07-25 DIAGNOSIS — I5032 Chronic diastolic (congestive) heart failure: Secondary | ICD-10-CM

## 2019-07-25 DIAGNOSIS — I130 Hypertensive heart and chronic kidney disease with heart failure and stage 1 through stage 4 chronic kidney disease, or unspecified chronic kidney disease: Secondary | ICD-10-CM | POA: Diagnosis not present

## 2019-07-26 DIAGNOSIS — K922 Gastrointestinal hemorrhage, unspecified: Secondary | ICD-10-CM | POA: Diagnosis not present

## 2019-07-26 DIAGNOSIS — E860 Dehydration: Secondary | ICD-10-CM | POA: Diagnosis not present

## 2019-07-26 DIAGNOSIS — N184 Chronic kidney disease, stage 4 (severe): Secondary | ICD-10-CM | POA: Diagnosis not present

## 2019-07-26 DIAGNOSIS — I130 Hypertensive heart and chronic kidney disease with heart failure and stage 1 through stage 4 chronic kidney disease, or unspecified chronic kidney disease: Secondary | ICD-10-CM | POA: Diagnosis not present

## 2019-07-27 DIAGNOSIS — N184 Chronic kidney disease, stage 4 (severe): Secondary | ICD-10-CM | POA: Diagnosis not present

## 2019-07-27 DIAGNOSIS — I130 Hypertensive heart and chronic kidney disease with heart failure and stage 1 through stage 4 chronic kidney disease, or unspecified chronic kidney disease: Secondary | ICD-10-CM | POA: Diagnosis not present

## 2019-07-27 DIAGNOSIS — E860 Dehydration: Secondary | ICD-10-CM | POA: Diagnosis not present

## 2019-07-27 DIAGNOSIS — K922 Gastrointestinal hemorrhage, unspecified: Secondary | ICD-10-CM | POA: Diagnosis not present

## 2019-07-28 DIAGNOSIS — K922 Gastrointestinal hemorrhage, unspecified: Secondary | ICD-10-CM | POA: Diagnosis not present

## 2019-07-28 DIAGNOSIS — I130 Hypertensive heart and chronic kidney disease with heart failure and stage 1 through stage 4 chronic kidney disease, or unspecified chronic kidney disease: Secondary | ICD-10-CM | POA: Diagnosis not present

## 2019-07-28 DIAGNOSIS — N184 Chronic kidney disease, stage 4 (severe): Secondary | ICD-10-CM | POA: Diagnosis not present

## 2019-07-28 DIAGNOSIS — E860 Dehydration: Secondary | ICD-10-CM | POA: Diagnosis not present

## 2019-07-29 DIAGNOSIS — K922 Gastrointestinal hemorrhage, unspecified: Secondary | ICD-10-CM | POA: Diagnosis not present

## 2019-07-29 DIAGNOSIS — N184 Chronic kidney disease, stage 4 (severe): Secondary | ICD-10-CM | POA: Diagnosis not present

## 2019-07-29 DIAGNOSIS — E860 Dehydration: Secondary | ICD-10-CM | POA: Diagnosis not present

## 2019-07-29 DIAGNOSIS — I130 Hypertensive heart and chronic kidney disease with heart failure and stage 1 through stage 4 chronic kidney disease, or unspecified chronic kidney disease: Secondary | ICD-10-CM | POA: Diagnosis not present

## 2019-07-30 DIAGNOSIS — K922 Gastrointestinal hemorrhage, unspecified: Secondary | ICD-10-CM | POA: Diagnosis not present

## 2019-07-30 DIAGNOSIS — N184 Chronic kidney disease, stage 4 (severe): Secondary | ICD-10-CM | POA: Diagnosis not present

## 2019-07-30 DIAGNOSIS — I130 Hypertensive heart and chronic kidney disease with heart failure and stage 1 through stage 4 chronic kidney disease, or unspecified chronic kidney disease: Secondary | ICD-10-CM | POA: Diagnosis not present

## 2019-07-30 DIAGNOSIS — E860 Dehydration: Secondary | ICD-10-CM | POA: Diagnosis not present

## 2019-08-07 ENCOUNTER — Encounter: Payer: Self-pay | Admitting: Infectious Diseases

## 2019-08-07 DIAGNOSIS — Z89519 Acquired absence of unspecified leg below knee: Secondary | ICD-10-CM

## 2019-08-07 DIAGNOSIS — I10 Essential (primary) hypertension: Secondary | ICD-10-CM

## 2019-08-07 DIAGNOSIS — N189 Chronic kidney disease, unspecified: Secondary | ICD-10-CM

## 2019-08-07 DIAGNOSIS — R7989 Other specified abnormal findings of blood chemistry: Secondary | ICD-10-CM

## 2019-08-07 DIAGNOSIS — L03116 Cellulitis of left lower limb: Secondary | ICD-10-CM | POA: Diagnosis not present

## 2019-08-07 DIAGNOSIS — E039 Hypothyroidism, unspecified: Secondary | ICD-10-CM

## 2019-08-07 DIAGNOSIS — E1142 Type 2 diabetes mellitus with diabetic polyneuropathy: Secondary | ICD-10-CM

## 2019-08-07 DIAGNOSIS — I5032 Chronic diastolic (congestive) heart failure: Secondary | ICD-10-CM

## 2019-08-07 DIAGNOSIS — N184 Chronic kidney disease, stage 4 (severe): Secondary | ICD-10-CM | POA: Diagnosis not present

## 2019-08-07 DIAGNOSIS — E669 Obesity, unspecified: Secondary | ICD-10-CM

## 2019-08-08 DIAGNOSIS — N184 Chronic kidney disease, stage 4 (severe): Secondary | ICD-10-CM | POA: Diagnosis not present

## 2019-08-08 DIAGNOSIS — E1142 Type 2 diabetes mellitus with diabetic polyneuropathy: Secondary | ICD-10-CM | POA: Diagnosis not present

## 2019-08-08 DIAGNOSIS — R7989 Other specified abnormal findings of blood chemistry: Secondary | ICD-10-CM | POA: Diagnosis not present

## 2019-08-08 DIAGNOSIS — I340 Nonrheumatic mitral (valve) insufficiency: Secondary | ICD-10-CM

## 2019-08-08 DIAGNOSIS — L03116 Cellulitis of left lower limb: Secondary | ICD-10-CM | POA: Diagnosis not present

## 2019-08-09 DIAGNOSIS — N184 Chronic kidney disease, stage 4 (severe): Secondary | ICD-10-CM | POA: Diagnosis not present

## 2019-08-09 DIAGNOSIS — L03116 Cellulitis of left lower limb: Secondary | ICD-10-CM | POA: Diagnosis not present

## 2019-08-09 DIAGNOSIS — R7989 Other specified abnormal findings of blood chemistry: Secondary | ICD-10-CM | POA: Diagnosis not present

## 2019-08-09 DIAGNOSIS — E1142 Type 2 diabetes mellitus with diabetic polyneuropathy: Secondary | ICD-10-CM | POA: Diagnosis not present

## 2019-08-10 DIAGNOSIS — M109 Gout, unspecified: Secondary | ICD-10-CM

## 2019-08-10 DIAGNOSIS — N184 Chronic kidney disease, stage 4 (severe): Secondary | ICD-10-CM | POA: Diagnosis not present

## 2019-08-10 DIAGNOSIS — L03116 Cellulitis of left lower limb: Secondary | ICD-10-CM | POA: Diagnosis not present

## 2019-08-10 DIAGNOSIS — M21962 Unspecified acquired deformity of left lower leg: Secondary | ICD-10-CM | POA: Diagnosis not present

## 2019-08-10 DIAGNOSIS — S90819A Abrasion, unspecified foot, initial encounter: Secondary | ICD-10-CM

## 2019-08-10 DIAGNOSIS — R7989 Other specified abnormal findings of blood chemistry: Secondary | ICD-10-CM | POA: Diagnosis not present

## 2019-08-10 DIAGNOSIS — E1122 Type 2 diabetes mellitus with diabetic chronic kidney disease: Secondary | ICD-10-CM | POA: Diagnosis not present

## 2019-08-10 DIAGNOSIS — E1142 Type 2 diabetes mellitus with diabetic polyneuropathy: Secondary | ICD-10-CM | POA: Diagnosis not present

## 2019-08-11 DIAGNOSIS — E1142 Type 2 diabetes mellitus with diabetic polyneuropathy: Secondary | ICD-10-CM | POA: Diagnosis not present

## 2019-08-11 DIAGNOSIS — N184 Chronic kidney disease, stage 4 (severe): Secondary | ICD-10-CM | POA: Diagnosis not present

## 2019-08-11 DIAGNOSIS — R7989 Other specified abnormal findings of blood chemistry: Secondary | ICD-10-CM | POA: Diagnosis not present

## 2019-08-11 DIAGNOSIS — L03116 Cellulitis of left lower limb: Secondary | ICD-10-CM | POA: Diagnosis not present

## 2019-08-13 DIAGNOSIS — N184 Chronic kidney disease, stage 4 (severe): Secondary | ICD-10-CM | POA: Diagnosis not present

## 2019-08-13 DIAGNOSIS — E1142 Type 2 diabetes mellitus with diabetic polyneuropathy: Secondary | ICD-10-CM | POA: Diagnosis not present

## 2019-08-13 DIAGNOSIS — R7989 Other specified abnormal findings of blood chemistry: Secondary | ICD-10-CM | POA: Diagnosis not present

## 2019-08-13 DIAGNOSIS — L03116 Cellulitis of left lower limb: Secondary | ICD-10-CM | POA: Diagnosis not present

## 2019-08-14 DIAGNOSIS — N184 Chronic kidney disease, stage 4 (severe): Secondary | ICD-10-CM | POA: Diagnosis not present

## 2019-08-14 DIAGNOSIS — R7989 Other specified abnormal findings of blood chemistry: Secondary | ICD-10-CM | POA: Diagnosis not present

## 2019-08-14 DIAGNOSIS — L03116 Cellulitis of left lower limb: Secondary | ICD-10-CM | POA: Diagnosis not present

## 2019-08-14 DIAGNOSIS — E1142 Type 2 diabetes mellitus with diabetic polyneuropathy: Secondary | ICD-10-CM | POA: Diagnosis not present

## 2019-08-30 DIAGNOSIS — I129 Hypertensive chronic kidney disease with stage 1 through stage 4 chronic kidney disease, or unspecified chronic kidney disease: Secondary | ICD-10-CM

## 2019-08-31 ENCOUNTER — Inpatient Hospital Stay (HOSPITAL_COMMUNITY)
Admission: AD | Admit: 2019-08-31 | Discharge: 2019-10-09 | DRG: 552 | Disposition: A | Discharge status: Home Health | Payer: Medicare Other | Source: Other Acute Inpatient Hospital | Attending: Internal Medicine | Admitting: Internal Medicine

## 2019-08-31 ENCOUNTER — Inpatient Hospital Stay (HOSPITAL_COMMUNITY): Payer: Medicare Other

## 2019-08-31 ENCOUNTER — Encounter (HOSPITAL_COMMUNITY): Payer: Self-pay | Admitting: Internal Medicine

## 2019-08-31 DIAGNOSIS — N390 Urinary tract infection, site not specified: Secondary | ICD-10-CM | POA: Diagnosis present

## 2019-08-31 DIAGNOSIS — N400 Enlarged prostate without lower urinary tract symptoms: Secondary | ICD-10-CM | POA: Diagnosis present

## 2019-08-31 DIAGNOSIS — Z20828 Contact with and (suspected) exposure to other viral communicable diseases: Secondary | ICD-10-CM | POA: Diagnosis present

## 2019-08-31 DIAGNOSIS — N183 Chronic kidney disease, stage 3 unspecified: Secondary | ICD-10-CM

## 2019-08-31 DIAGNOSIS — Z91018 Allergy to other foods: Secondary | ICD-10-CM

## 2019-08-31 DIAGNOSIS — Z91013 Allergy to seafood: Secondary | ICD-10-CM | POA: Diagnosis not present

## 2019-08-31 DIAGNOSIS — N401 Enlarged prostate with lower urinary tract symptoms: Secondary | ICD-10-CM | POA: Diagnosis present

## 2019-08-31 DIAGNOSIS — E039 Hypothyroidism, unspecified: Secondary | ICD-10-CM | POA: Diagnosis present

## 2019-08-31 DIAGNOSIS — M4624 Osteomyelitis of vertebra, thoracic region: Secondary | ICD-10-CM | POA: Diagnosis present

## 2019-08-31 DIAGNOSIS — Z886 Allergy status to analgesic agent status: Secondary | ICD-10-CM | POA: Diagnosis not present

## 2019-08-31 DIAGNOSIS — N184 Chronic kidney disease, stage 4 (severe): Secondary | ICD-10-CM | POA: Diagnosis present

## 2019-08-31 DIAGNOSIS — R066 Hiccough: Secondary | ICD-10-CM | POA: Diagnosis not present

## 2019-08-31 DIAGNOSIS — Z95828 Presence of other vascular implants and grafts: Secondary | ICD-10-CM

## 2019-08-31 DIAGNOSIS — N179 Acute kidney failure, unspecified: Secondary | ICD-10-CM | POA: Diagnosis present

## 2019-08-31 DIAGNOSIS — R7881 Bacteremia: Secondary | ICD-10-CM | POA: Diagnosis present

## 2019-08-31 DIAGNOSIS — L89890 Pressure ulcer of other site, unstageable: Secondary | ICD-10-CM | POA: Diagnosis present

## 2019-08-31 DIAGNOSIS — L89620 Pressure ulcer of left heel, unstageable: Secondary | ICD-10-CM | POA: Diagnosis present

## 2019-08-31 DIAGNOSIS — E119 Type 2 diabetes mellitus without complications: Secondary | ICD-10-CM

## 2019-08-31 DIAGNOSIS — D509 Iron deficiency anemia, unspecified: Secondary | ICD-10-CM | POA: Diagnosis present

## 2019-08-31 DIAGNOSIS — R339 Retention of urine, unspecified: Secondary | ICD-10-CM | POA: Diagnosis present

## 2019-08-31 DIAGNOSIS — R1013 Epigastric pain: Secondary | ICD-10-CM

## 2019-08-31 DIAGNOSIS — L8990 Pressure ulcer of unspecified site, unspecified stage: Secondary | ICD-10-CM | POA: Insufficient documentation

## 2019-08-31 DIAGNOSIS — Z794 Long term (current) use of insulin: Secondary | ICD-10-CM

## 2019-08-31 DIAGNOSIS — Z86718 Personal history of other venous thrombosis and embolism: Secondary | ICD-10-CM

## 2019-08-31 DIAGNOSIS — R079 Chest pain, unspecified: Secondary | ICD-10-CM

## 2019-08-31 DIAGNOSIS — I129 Hypertensive chronic kidney disease with stage 1 through stage 4 chronic kidney disease, or unspecified chronic kidney disease: Secondary | ICD-10-CM | POA: Diagnosis present

## 2019-08-31 DIAGNOSIS — Z881 Allergy status to other antibiotic agents status: Secondary | ICD-10-CM

## 2019-08-31 DIAGNOSIS — M62830 Muscle spasm of back: Secondary | ICD-10-CM | POA: Diagnosis not present

## 2019-08-31 DIAGNOSIS — Z981 Arthrodesis status: Secondary | ICD-10-CM

## 2019-08-31 DIAGNOSIS — E11621 Type 2 diabetes mellitus with foot ulcer: Secondary | ICD-10-CM | POA: Diagnosis present

## 2019-08-31 DIAGNOSIS — Z86711 Personal history of pulmonary embolism: Secondary | ICD-10-CM

## 2019-08-31 DIAGNOSIS — Z89511 Acquired absence of right leg below knee: Secondary | ICD-10-CM | POA: Diagnosis not present

## 2019-08-31 DIAGNOSIS — M109 Gout, unspecified: Secondary | ICD-10-CM | POA: Diagnosis present

## 2019-08-31 DIAGNOSIS — Z885 Allergy status to narcotic agent status: Secondary | ICD-10-CM | POA: Diagnosis not present

## 2019-08-31 DIAGNOSIS — M4644 Discitis, unspecified, thoracic region: Principal | ICD-10-CM | POA: Diagnosis present

## 2019-08-31 DIAGNOSIS — B9562 Methicillin resistant Staphylococcus aureus infection as the cause of diseases classified elsewhere: Secondary | ICD-10-CM | POA: Diagnosis present

## 2019-08-31 DIAGNOSIS — M4640 Discitis, unspecified, site unspecified: Secondary | ICD-10-CM | POA: Diagnosis not present

## 2019-08-31 DIAGNOSIS — Z888 Allergy status to other drugs, medicaments and biological substances status: Secondary | ICD-10-CM

## 2019-08-31 DIAGNOSIS — E1122 Type 2 diabetes mellitus with diabetic chronic kidney disease: Secondary | ICD-10-CM | POA: Diagnosis present

## 2019-08-31 DIAGNOSIS — D649 Anemia, unspecified: Secondary | ICD-10-CM | POA: Diagnosis not present

## 2019-08-31 DIAGNOSIS — M4620 Osteomyelitis of vertebra, site unspecified: Secondary | ICD-10-CM | POA: Diagnosis not present

## 2019-08-31 DIAGNOSIS — E875 Hyperkalemia: Secondary | ICD-10-CM | POA: Diagnosis not present

## 2019-08-31 DIAGNOSIS — K5900 Constipation, unspecified: Secondary | ICD-10-CM | POA: Diagnosis not present

## 2019-08-31 DIAGNOSIS — E871 Hypo-osmolality and hyponatremia: Secondary | ICD-10-CM | POA: Diagnosis present

## 2019-08-31 DIAGNOSIS — Z79891 Long term (current) use of opiate analgesic: Secondary | ICD-10-CM

## 2019-08-31 DIAGNOSIS — Z79899 Other long term (current) drug therapy: Secondary | ICD-10-CM

## 2019-08-31 DIAGNOSIS — Z882 Allergy status to sulfonamides status: Secondary | ICD-10-CM

## 2019-08-31 DIAGNOSIS — E1169 Type 2 diabetes mellitus with other specified complication: Secondary | ICD-10-CM | POA: Diagnosis present

## 2019-08-31 DIAGNOSIS — D631 Anemia in chronic kidney disease: Secondary | ICD-10-CM | POA: Diagnosis present

## 2019-08-31 DIAGNOSIS — D638 Anemia in other chronic diseases classified elsewhere: Secondary | ICD-10-CM | POA: Diagnosis present

## 2019-08-31 DIAGNOSIS — I10 Essential (primary) hypertension: Secondary | ICD-10-CM | POA: Diagnosis not present

## 2019-08-31 DIAGNOSIS — R338 Other retention of urine: Secondary | ICD-10-CM | POA: Diagnosis present

## 2019-08-31 DIAGNOSIS — G894 Chronic pain syndrome: Secondary | ICD-10-CM | POA: Diagnosis present

## 2019-08-31 DIAGNOSIS — Z79890 Hormone replacement therapy (postmenopausal): Secondary | ICD-10-CM

## 2019-08-31 DIAGNOSIS — R109 Unspecified abdominal pain: Secondary | ICD-10-CM

## 2019-08-31 LAB — CBC WITH DIFFERENTIAL/PLATELET
Abs Immature Granulocytes: 0.05 10*3/uL (ref 0.00–0.07)
Basophils Absolute: 0 10*3/uL (ref 0.0–0.1)
Basophils Relative: 0 %
Eosinophils Absolute: 0.2 10*3/uL (ref 0.0–0.5)
Eosinophils Relative: 3 %
HCT: 30.1 % — ABNORMAL LOW (ref 39.0–52.0)
Hemoglobin: 9.4 g/dL — ABNORMAL LOW (ref 13.0–17.0)
Immature Granulocytes: 1 %
Lymphocytes Relative: 14 %
Lymphs Abs: 1 10*3/uL (ref 0.7–4.0)
MCH: 29.8 pg (ref 26.0–34.0)
MCHC: 31.2 g/dL (ref 30.0–36.0)
MCV: 95.6 fL (ref 80.0–100.0)
Monocytes Absolute: 0.6 10*3/uL (ref 0.1–1.0)
Monocytes Relative: 9 %
Neutro Abs: 5.2 10*3/uL (ref 1.7–7.7)
Neutrophils Relative %: 73 %
Platelets: 216 10*3/uL (ref 150–400)
RBC: 3.15 MIL/uL — ABNORMAL LOW (ref 4.22–5.81)
RDW: 16.2 % — ABNORMAL HIGH (ref 11.5–15.5)
WBC: 7.1 10*3/uL (ref 4.0–10.5)
nRBC: 0 % (ref 0.0–0.2)

## 2019-08-31 LAB — COMPREHENSIVE METABOLIC PANEL
ALT: 15 U/L (ref 0–44)
AST: 33 U/L (ref 15–41)
Albumin: 2.4 g/dL — ABNORMAL LOW (ref 3.5–5.0)
Alkaline Phosphatase: 102 U/L (ref 38–126)
Anion gap: 14 (ref 5–15)
BUN: 91 mg/dL — ABNORMAL HIGH (ref 8–23)
CO2: 22 mmol/L (ref 22–32)
Calcium: 8.7 mg/dL — ABNORMAL LOW (ref 8.9–10.3)
Chloride: 103 mmol/L (ref 98–111)
Creatinine, Ser: 3.1 mg/dL — ABNORMAL HIGH (ref 0.61–1.24)
GFR calc Af Amer: 23 mL/min — ABNORMAL LOW (ref >60)
GFR calc non Af Amer: 20 mL/min — ABNORMAL LOW (ref >60)
Glucose, Bld: 169 mg/dL — ABNORMAL HIGH (ref 70–99)
Potassium: 4.3 mmol/L (ref 3.5–5.1)
Sodium: 139 mmol/L (ref 135–145)
Total Bilirubin: 0.7 mg/dL (ref 0.3–1.2)
Total Protein: 7.5 g/dL (ref 6.5–8.1)

## 2019-08-31 LAB — SARS CORONAVIRUS 2 (TAT 6-24 HRS): SARS Coronavirus 2: NEGATIVE

## 2019-08-31 LAB — GLUCOSE, CAPILLARY
Glucose-Capillary: 204 mg/dL — ABNORMAL HIGH (ref 70–99)
Glucose-Capillary: 152 mg/dL — ABNORMAL HIGH (ref 70–99)
Glucose-Capillary: 141 mg/dL — ABNORMAL HIGH (ref 70–99)
Glucose-Capillary: 130 mg/dL — ABNORMAL HIGH (ref 70–99)

## 2019-08-31 LAB — HIV ANTIBODY (ROUTINE TESTING W REFLEX): HIV Screen 4th Generation wRfx: NONREACTIVE

## 2019-08-31 LAB — TROPONIN I (HIGH SENSITIVITY)
Troponin I (High Sensitivity): 19 ng/L — ABNORMAL HIGH (ref <18)
Troponin I (High Sensitivity): 18 ng/L — ABNORMAL HIGH (ref <18)

## 2019-08-31 IMAGING — DX DG CHEST 1V PORT
2 series · 2 of 2 positions shown · non-contrast
Comparison: [DATE]

CLINICAL DATA: Chest pain and shortness of breath

EXAM:
PORTABLE CHEST 1 VIEW

[chest ap (1 of 2)]
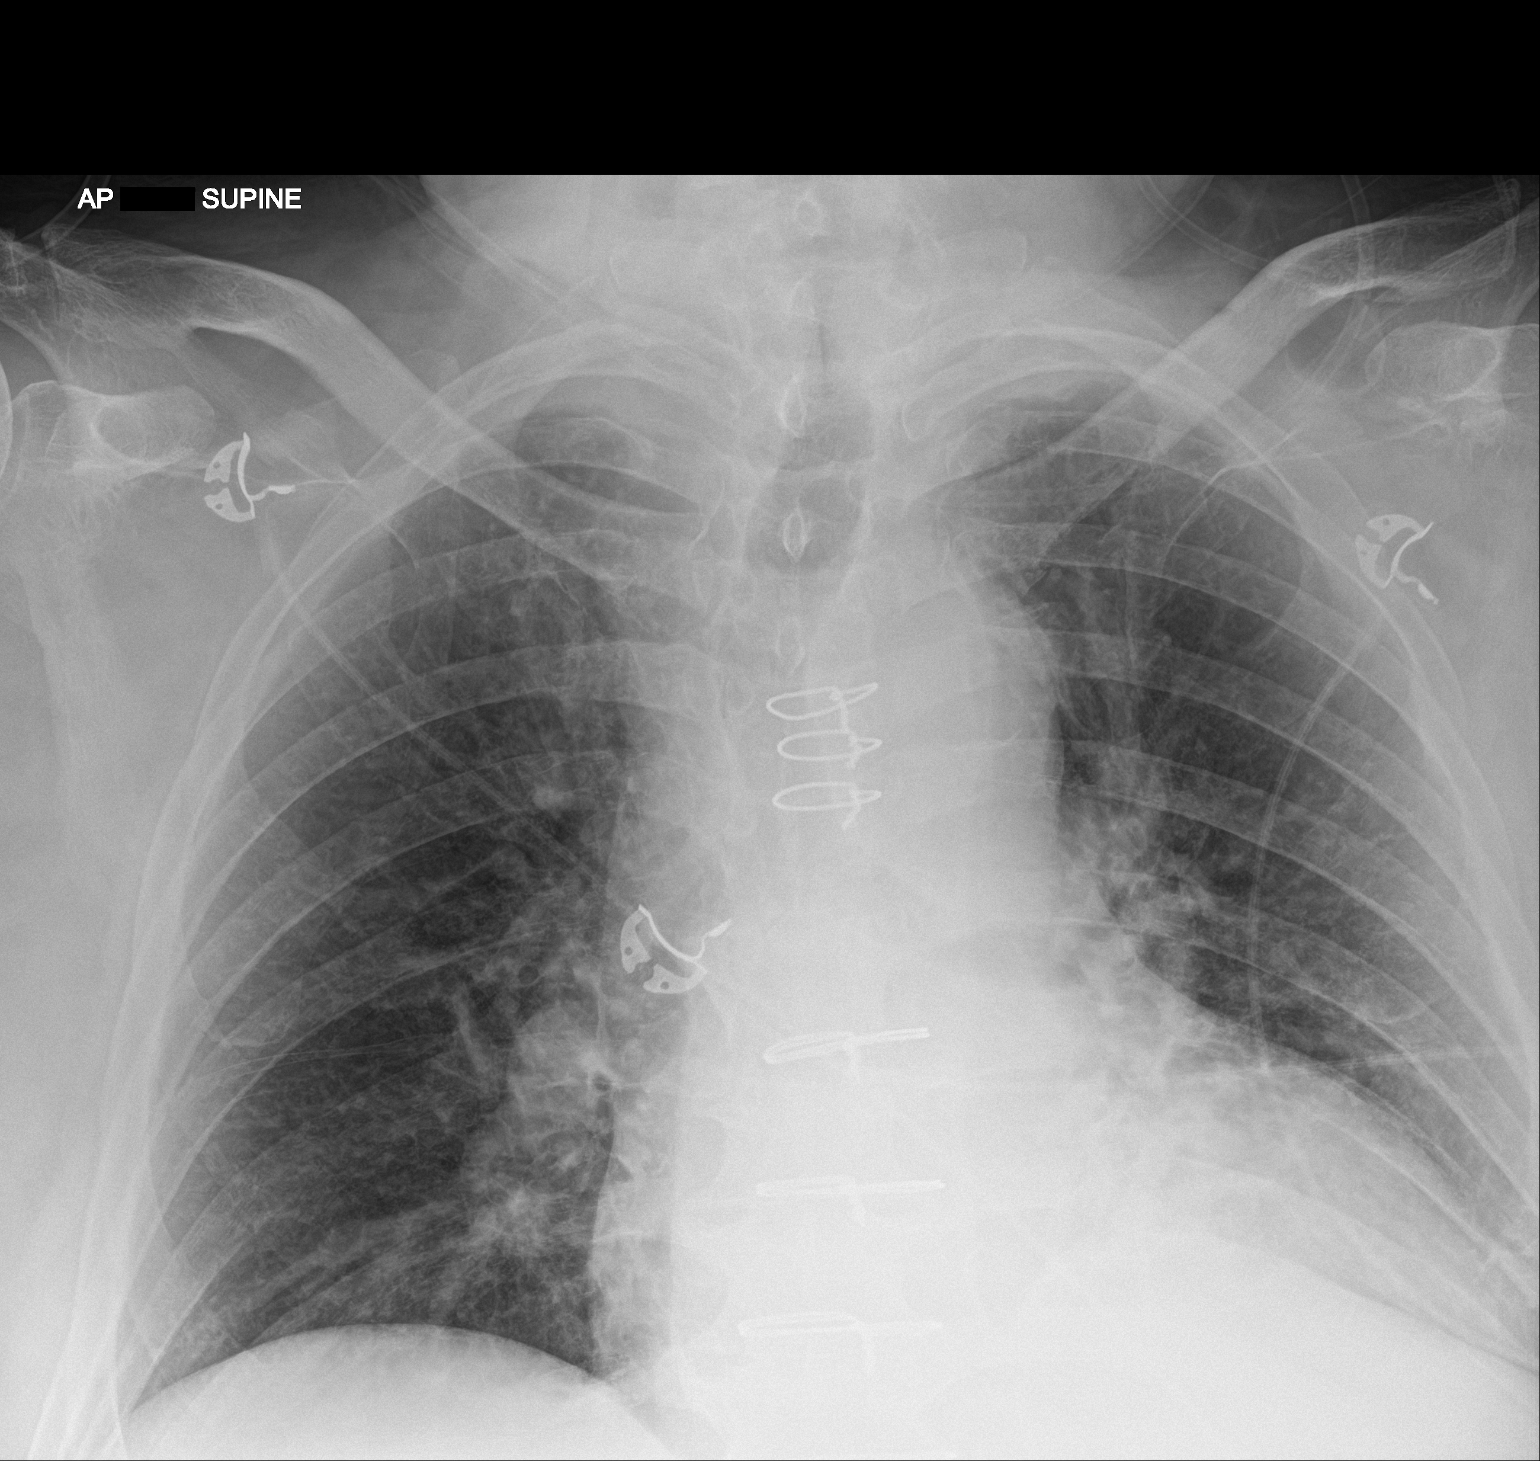

[chest ap (2 of 2)]
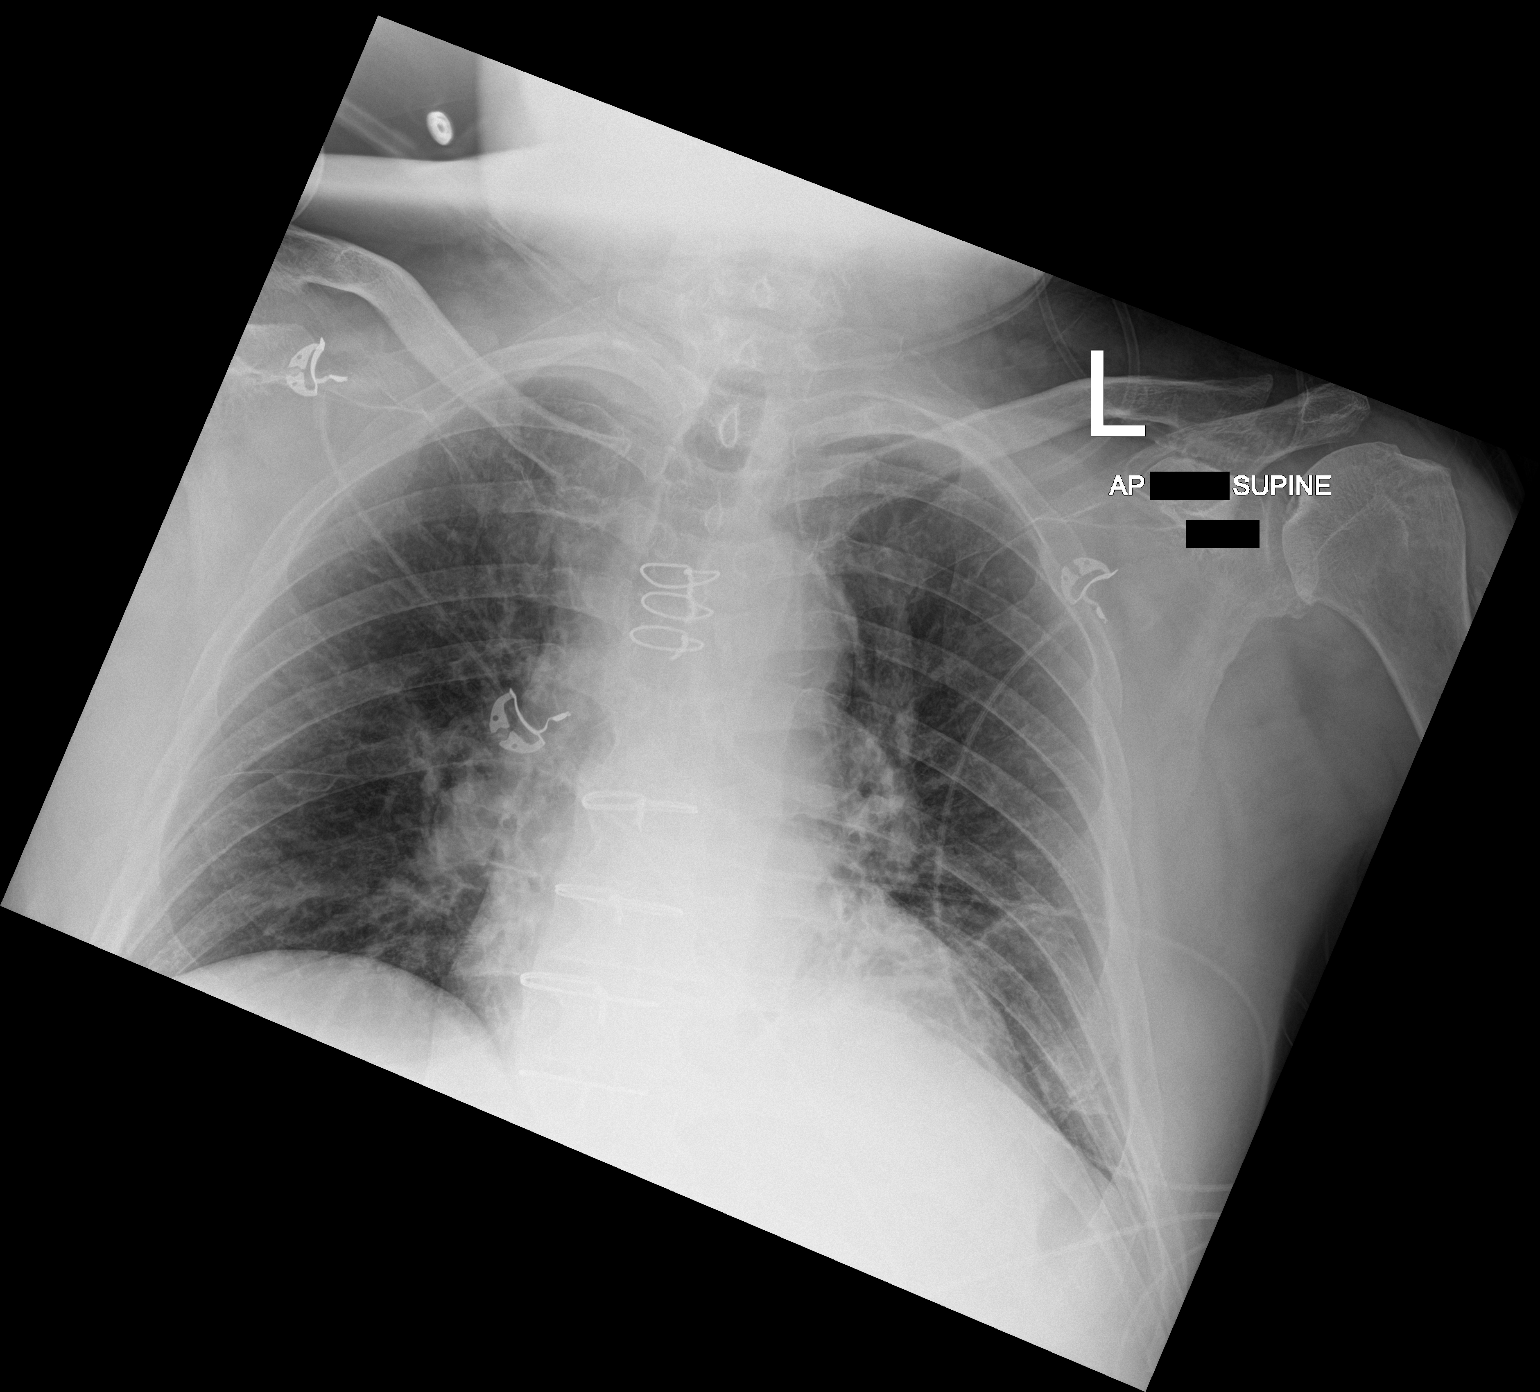

[2 of 2 positions shown; findings below may reference images not displayed]

FINDINGS: Cardiac shadow is enlarged but stable. Postsurgical changes are
again seen. The lungs are well aerated bilaterally. No focal
infiltrate or sizable effusion is seen. Mild persistent left basilar
atelectasis is noted.
IMPRESSION: Stable left basilar atelectasis. No other focal abnormality is seen.

## 2019-08-31 MED ORDER — GABAPENTIN 300 MG PO CAPS
300.0000 mg | ORAL_CAPSULE | Freq: Three times a day (TID) | ORAL | Status: DC
  Administered 2019-08-31 (×3): 300 mg via ORAL
  Filled 2019-08-31 (×4): qty 1

## 2019-08-31 MED ORDER — SODIUM CHLORIDE 0.9 % IV SOLN
750.0000 mg | INTRAVENOUS | Status: DC
  Administered 2019-08-31: 750 mg via INTRAVENOUS
  Filled 2019-08-31: qty 15

## 2019-08-31 MED ORDER — SODIUM CHLORIDE 0.9 % IV SOLN
1000.0000 mg | INTRAVENOUS | Status: DC
  Filled 2019-08-31: qty 20

## 2019-08-31 MED ORDER — ACETAMINOPHEN 325 MG PO TABS
650.0000 mg | ORAL_TABLET | Freq: Four times a day (QID) | ORAL | Status: DC | PRN
  Administered 2019-09-10 – 2019-09-29 (×5): 650 mg via ORAL
  Filled 2019-08-31 (×6): qty 2

## 2019-08-31 MED ORDER — ONDANSETRON HCL 4 MG PO TABS: 4.0000 mg | ORAL_TABLET | Freq: Four times a day (QID) | ORAL | Status: DC | PRN

## 2019-08-31 MED ORDER — AMLODIPINE BESYLATE 10 MG PO TABS
10.0000 mg | ORAL_TABLET | Freq: Every day | ORAL | Status: DC
  Administered 2019-08-31 – 2019-10-09 (×40): 10 mg via ORAL
  Filled 2019-08-31 (×8): qty 1
  Filled 2019-08-31: qty 2
  Filled 2019-08-31 (×31): qty 1

## 2019-08-31 MED ORDER — MORPHINE SULFATE (PF) 2 MG/ML IV SOLN
2.0000 mg | INTRAVENOUS | Status: DC | PRN
  Administered 2019-08-31 – 2019-09-07 (×38): 2 mg via INTRAVENOUS
  Filled 2019-08-31 (×38): qty 1

## 2019-08-31 MED ORDER — MORPHINE SULFATE (PF) 2 MG/ML IV SOLN
1.0000 mg | Freq: Once | INTRAVENOUS | Status: AC
  Administered 2019-08-31: 1 mg via INTRAVENOUS
  Filled 2019-08-31: qty 1

## 2019-08-31 MED ORDER — MORPHINE SULFATE (PF) 2 MG/ML IV SOLN: 1.0000 mg | INTRAVENOUS | Status: DC | PRN

## 2019-08-31 MED ORDER — LEVOTHYROXINE SODIUM 50 MCG PO TABS
50.0000 ug | ORAL_TABLET | Freq: Every day | ORAL | Status: DC
  Administered 2019-08-31 – 2019-10-09 (×40): 50 ug via ORAL
  Filled 2019-08-31 (×40): qty 1

## 2019-08-31 MED ORDER — ONDANSETRON HCL 4 MG/2ML IJ SOLN
4.0000 mg | Freq: Four times a day (QID) | INTRAMUSCULAR | Status: DC | PRN
  Administered 2019-10-01: 4 mg via INTRAVENOUS
  Filled 2019-08-31: qty 2

## 2019-08-31 MED ORDER — INSULIN ASPART 100 UNIT/ML ~~LOC~~ SOLN
0.0000 [IU] | Freq: Three times a day (TID) | SUBCUTANEOUS | Status: DC
  Administered 2019-08-31: 3 [IU] via SUBCUTANEOUS
  Administered 2019-08-31: 2 [IU] via SUBCUTANEOUS
  Administered 2019-08-31: 1 [IU] via SUBCUTANEOUS
  Administered 2019-09-01: 2 [IU] via SUBCUTANEOUS
  Administered 2019-09-01 – 2019-09-02 (×3): 1 [IU] via SUBCUTANEOUS
  Administered 2019-09-02: 2 [IU] via SUBCUTANEOUS
  Administered 2019-09-02: 1 [IU] via SUBCUTANEOUS
  Administered 2019-09-03: 2 [IU] via SUBCUTANEOUS
  Administered 2019-09-03: 3 [IU] via SUBCUTANEOUS
  Administered 2019-09-03: 2 [IU] via SUBCUTANEOUS
  Administered 2019-09-04: 5 [IU] via SUBCUTANEOUS
  Administered 2019-09-04: 1 [IU] via SUBCUTANEOUS
  Administered 2019-09-04 – 2019-09-05 (×2): 3 [IU] via SUBCUTANEOUS
  Administered 2019-09-05: 2 [IU] via SUBCUTANEOUS
  Administered 2019-09-06 (×2): 3 [IU] via SUBCUTANEOUS
  Administered 2019-09-07: 1 [IU] via SUBCUTANEOUS
  Administered 2019-09-07 (×2): 2 [IU] via SUBCUTANEOUS
  Administered 2019-09-08: 3 [IU] via SUBCUTANEOUS
  Administered 2019-09-08 – 2019-09-09 (×3): 2 [IU] via SUBCUTANEOUS
  Administered 2019-09-09 – 2019-09-10 (×3): 1 [IU] via SUBCUTANEOUS
  Administered 2019-09-10 – 2019-09-11 (×2): 2 [IU] via SUBCUTANEOUS
  Administered 2019-09-11 (×2): 1 [IU] via SUBCUTANEOUS
  Administered 2019-09-12 (×3): 2 [IU] via SUBCUTANEOUS
  Administered 2019-09-13: 1 [IU] via SUBCUTANEOUS
  Administered 2019-09-13: 2 [IU] via SUBCUTANEOUS
  Administered 2019-09-13 – 2019-09-14 (×2): 1 [IU] via SUBCUTANEOUS
  Administered 2019-09-14: 3 [IU] via SUBCUTANEOUS
  Administered 2019-09-14 – 2019-09-15 (×2): 2 [IU] via SUBCUTANEOUS
  Administered 2019-09-15: 1 [IU] via SUBCUTANEOUS
  Administered 2019-09-15 – 2019-09-16 (×4): 2 [IU] via SUBCUTANEOUS
  Administered 2019-09-17: 1 [IU] via SUBCUTANEOUS
  Administered 2019-09-17 (×2): 2 [IU] via SUBCUTANEOUS
  Administered 2019-09-18: 3 [IU] via SUBCUTANEOUS
  Administered 2019-09-18: 2 [IU] via SUBCUTANEOUS
  Administered 2019-09-18: 3 [IU] via SUBCUTANEOUS
  Administered 2019-09-19: 1 [IU] via SUBCUTANEOUS
  Administered 2019-09-19 – 2019-09-20 (×5): 2 [IU] via SUBCUTANEOUS
  Administered 2019-09-21: 1 [IU] via SUBCUTANEOUS
  Administered 2019-09-21 – 2019-09-22 (×3): 2 [IU] via SUBCUTANEOUS
  Administered 2019-09-22: 3 [IU] via SUBCUTANEOUS
  Administered 2019-09-23 (×2): 1 [IU] via SUBCUTANEOUS
  Administered 2019-09-23: 2 [IU] via SUBCUTANEOUS
  Administered 2019-09-24: 3 [IU] via SUBCUTANEOUS
  Administered 2019-09-24: 1 [IU] via SUBCUTANEOUS
  Administered 2019-09-25: 3 [IU] via SUBCUTANEOUS
  Administered 2019-09-25: 2 [IU] via SUBCUTANEOUS
  Administered 2019-09-25: 1 [IU] via SUBCUTANEOUS
  Administered 2019-09-26: 2 [IU] via SUBCUTANEOUS
  Administered 2019-09-26 (×2): 1 [IU] via SUBCUTANEOUS
  Administered 2019-09-27 (×2): 2 [IU] via SUBCUTANEOUS
  Administered 2019-09-27: 1 [IU] via SUBCUTANEOUS
  Administered 2019-09-28: 5 [IU] via SUBCUTANEOUS
  Administered 2019-09-28 – 2019-09-29 (×2): 1 [IU] via SUBCUTANEOUS
  Administered 2019-09-29 (×2): 2 [IU] via SUBCUTANEOUS
  Administered 2019-09-30: 3 [IU] via SUBCUTANEOUS
  Administered 2019-09-30: 1 [IU] via SUBCUTANEOUS
  Administered 2019-09-30 – 2019-10-01 (×3): 2 [IU] via SUBCUTANEOUS
  Administered 2019-10-02 (×2): 1 [IU] via SUBCUTANEOUS
  Administered 2019-10-02: 12:00:00 3 [IU] via SUBCUTANEOUS
  Administered 2019-10-03: 2 [IU] via SUBCUTANEOUS
  Administered 2019-10-03: 1 [IU] via SUBCUTANEOUS
  Administered 2019-10-03 – 2019-10-04 (×3): 2 [IU] via SUBCUTANEOUS
  Administered 2019-10-05 (×2): 1 [IU] via SUBCUTANEOUS
  Administered 2019-10-06: 2 [IU] via SUBCUTANEOUS
  Administered 2019-10-06 (×2): 1 [IU] via SUBCUTANEOUS
  Administered 2019-10-07: 2 [IU] via SUBCUTANEOUS
  Administered 2019-10-07 – 2019-10-09 (×5): 1 [IU] via SUBCUTANEOUS

## 2019-08-31 MED ORDER — HYDRALAZINE HCL 20 MG/ML IJ SOLN
20.0000 mg | INTRAMUSCULAR | Status: DC | PRN
  Administered 2019-08-31 – 2019-09-26 (×2): 20 mg via INTRAVENOUS
  Filled 2019-08-31: qty 1

## 2019-08-31 MED ORDER — OXYCODONE HCL ER 20 MG PO T12A
20.0000 mg | EXTENDED_RELEASE_TABLET | Freq: Three times a day (TID) | ORAL | Status: DC
  Administered 2019-08-31 – 2019-09-25 (×76): 20 mg via ORAL
  Filled 2019-08-31: qty 2
  Filled 2019-08-31: qty 1
  Filled 2019-08-31 (×13): qty 2
  Filled 2019-08-31: qty 1
  Filled 2019-08-31 (×15): qty 2
  Filled 2019-08-31: qty 1
  Filled 2019-08-31 (×23): qty 2
  Filled 2019-08-31: qty 1
  Filled 2019-08-31 (×3): qty 2
  Filled 2019-08-31: qty 1
  Filled 2019-08-31 (×8): qty 2
  Filled 2019-08-31: qty 1
  Filled 2019-08-31: qty 2
  Filled 2019-08-31: qty 1
  Filled 2019-08-31: qty 2
  Filled 2019-08-31: qty 1
  Filled 2019-08-31 (×3): qty 2

## 2019-08-31 MED ORDER — CHLORHEXIDINE GLUCONATE CLOTH 2 % EX PADS
6.0000 | MEDICATED_PAD | Freq: Every day | CUTANEOUS | Status: DC
  Administered 2019-08-31 – 2019-09-01 (×2): 6 via TOPICAL

## 2019-08-31 MED ORDER — HYDRALAZINE HCL 25 MG PO TABS
25.0000 mg | ORAL_TABLET | Freq: Three times a day (TID) | ORAL | Status: DC
  Administered 2019-08-31 – 2019-09-07 (×22): 25 mg via ORAL
  Filled 2019-08-31 (×22): qty 1

## 2019-08-31 MED ORDER — METOPROLOL TARTRATE 50 MG PO TABS
50.0000 mg | ORAL_TABLET | Freq: Two times a day (BID) | ORAL | Status: DC
  Administered 2019-08-31 – 2019-10-09 (×76): 50 mg via ORAL
  Filled 2019-08-31 (×78): qty 1

## 2019-08-31 MED ORDER — CLONIDINE HCL 0.2 MG PO TABS
0.3000 mg | ORAL_TABLET | Freq: Three times a day (TID) | ORAL | Status: DC
  Administered 2019-08-31 – 2019-09-30 (×91): 0.3 mg via ORAL
  Filled 2019-08-31 (×92): qty 1

## 2019-08-31 MED ORDER — HEPARIN SODIUM (PORCINE) 5000 UNIT/ML IJ SOLN
5000.0000 [IU] | Freq: Three times a day (TID) | INTRAMUSCULAR | Status: AC
  Administered 2019-08-31 – 2019-09-04 (×14): 5000 [IU] via SUBCUTANEOUS
  Filled 2019-08-31 (×14): qty 1

## 2019-08-31 MED ORDER — HYDRALAZINE HCL 20 MG/ML IJ SOLN: 10.0000 mg | INTRAMUSCULAR | Status: DC | PRN

## 2019-08-31 MED ORDER — ACETAMINOPHEN 650 MG RE SUPP: 650.0000 mg | Freq: Four times a day (QID) | RECTAL | Status: DC | PRN

## 2019-08-31 NOTE — Progress Notes (Signed)
Patient complaining of pain to chest, he states, "it feels like it's swelling around my neck. It starts from the center of spine and radiates down to my feet and head. The pain is unbearable." BP 190/93, HR 117. Dr. Hal Hope paged.

## 2019-08-31 NOTE — Progress Notes (Signed)
PROGRESS NOTE    Thomas [name]  [id number] DOB: 1950/11/04 DOA: 08/31/2019 PCP: Antonietta Jewel, MD   Brief Narrative:  HPI on 08/31/2019 by Dr. Gean Birchwood Thomas [name] is a 68 y.o. male with history of diabetes mellitus type 2, hypertension, chronic kidney disease stage III, anemia who was admitted at [facility] after having fever chills and back pain.  Patient does have a history of chronic back pain which acutely worsened.  Prior to this patient has had a right below-knee amputation following which patient was found to be infected with MRSA and was supposed to be on daptomycin but per discharge summary did not receive.  Prior to this admission patient had fever for 1 day.  As per the discharge summary COVID-19 test was negative.  Work-up in the ED showed leukocytosis with normocytic anemia hyponatremia creatinine of 3.7 baseline is usually around 2.7.  Urinalysis was positive for UTI chest x-ray showed nothing acute.  Right knee x-ray showed some irregularity suspected to be an infection.  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  Patient was placed on IV daptomycin.  Pain was worsening.  2D echo did not show anything acute.  Patient will need MRI with sedation and TEE for which patient was transferred to [facility].  On my exam patient still has back pain radiating to both legs which increased on movement of both legs.  Has had mild chest discomfort by the time I examined.  EKG troponin chest x-ray has been ordered all of which are pending.  Prior to transfer patient has had Foley catheter placement due to urinary retention.  Assessment & Plan  Patient admitted earlier today by Dr. Hal Hope. See H&P for details.  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 Pending ID consult  Consultants Infectious disease  Procedures  None  Antibiotics   Anti-infectives (From admission, onward)   Start     Dose/Rate Route Frequency Ordered Stop   09/02/19 2000  DAPTOmycin (CUBICIN) 1,000 mg in sodium chloride 0.9 % IVPB     1,000 mg 240 mL/hr over 30 Minutes Intravenous Every 48 hours 08/31/19 1205     08/31/19 1000  DAPTOmycin (CUBICIN) 750 mg in sodium chloride 0.9 % IVPB  Status:  Discontinued     750 mg 230 mL/hr over 30 Minutes Intravenous Every 48 hours 08/31/19 0414 08/31/19 1205      Subjective:   [name] seen and examined today.  Complains of pain from his head down to his toe.  Denies chest pain or shortness of breath, abdominal pain, dizziness or headache.  Objective:   Vitals:   08/31/19 0402 08/31/19 0406 08/31/19 0407 08/31/19 0609  BP: (!) 163/86 (!) 160/86 (!) 160/86 (!) 163/93  Pulse: 100  (!) 105 89  Resp:      Temp:    98.2 F (36.8 C)  TempSrc:  Oral  SpO2:   97% 96%  Weight:        Intake/Output Summary (Last 24 hours) at 08/31/2019 1225 Last data filed at 08/31/2019 1038 Gross per 24 hour  Intake 1077 ml  Output 750 ml  Net 327 ml   Filed Weights   08/31/19 0352  Weight: 119.3 kg    Exam-would not allow for repeat physical exam as it would be too painful  Data Reviewed: I have personally reviewed following labs and imaging studies  CBC: Recent Labs  Lab 08/31/19 0445  WBC 7.1   NEUTROABS 5.2  HGB 9.4*  HCT 30.1*  MCV 95.6  PLT 123XX123   Basic Metabolic Panel: Recent Labs  Lab 08/31/19 0445  NA 139  K 4.3  CL 103  CO2 22  GLUCOSE 169*  BUN 91*  CREATININE 3.10*  CALCIUM 8.7*   GFR: CrCl cannot be calculated (Unknown ideal weight.). Liver Function Tests: Recent Labs  Lab 08/31/19 0445  AST 33  ALT 15  ALKPHOS 102  BILITOT 0.7  PROT 7.5  ALBUMIN 2.4*   No results for input(s): LIPASE, AMYLASE in the last 168 hours. No results for input(s): AMMONIA in the last 168 hours. Coagulation Profile: No results for input(s): INR, PROTIME in the last 168 hours. Cardiac Enzymes: No results for input(s): CKTOTAL, CKMB, CKMBINDEX, TROPONINI in the last 168 hours. BNP (last 3 results) No results for input(s): PROBNP in the last 8760 hours. HbA1C: No results for input(s): HGBA1C in the last 72 hours. CBG: Recent Labs  Lab 08/31/19 0749 08/31/19 1212  GLUCAP 204* 152*   Lipid Profile: No results for input(s): CHOL, HDL, LDLCALC, TRIG, CHOLHDL, LDLDIRECT in the last 72 hours. Thyroid Function Tests: No results for input(s): TSH, T4TOTAL, FREET4, T3FREE, THYROIDAB in the last 72 hours. Anemia Panel: No results for input(s): VITAMINB12, FOLATE, FERRITIN, TIBC, IRON, RETICCTPCT in the last 72 hours. Urine analysis: No results found for: COLORURINE, APPEARANCEUR, LABSPEC, PHURINE, GLUCOSEU, HGBUR, BILIRUBINUR, KETONESUR, PROTEINUR, UROBILINOGEN, NITRITE, LEUKOCYTESUR Sepsis Labs: @LABRCNTIP (procalcitonin:4,lacticidven:4)  )No results found for this or any previous visit (from the past 240 hour(s)).    Radiology Studies: No results found.   Scheduled Meds: . amLODipine  10 mg Oral Daily  . cloNIDine  0.3 mg Oral Q8H  . gabapentin  300 mg Oral TID  . heparin  5,000 Units Subcutaneous Q8H  . hydrALAZINE  25 mg Oral Q8H  . insulin aspart  0-9 Units Subcutaneous TID WC  . levothyroxine  50 mcg Oral Q0600  . metoprolol tartrate  50 mg Oral BID  .  oxyCODONE  20 mg Oral Q8H   Continuous Infusions: . [START ON 09/02/2019] DAPTOmycin (CUBICIN)  IV       LOS: 0 days   Time Spent in minutes   30 minutes  Shannon Balthazar D.O. on 08/31/2019 at 12:25 PM  Between 7am to 7pm - Please see pager noted on [email or web address]  After 7pm go to [email or web address]  And look for the night coverage person covering for me after hours  [facility] Office  [phone]

## 2019-08-31 NOTE — Consult Note (Signed)
[facility] for Infectious Disease    Date of Admission:  08/31/2019   Total days of antibiotics 6        Day 6 of Daptomycin               Reason for Consult: Discitis due to MRSA bacteremia     Referring Provider: Dr. Gean Birchwood  Primary Care Provider: Dr. Sheryle Hail  Assessment: Discitis/osteomylitis MRSA bacteremia - Patient transferred from [facility] with fever and back pain due to sepsis secondary to MRSA bacteremia likely from a previous BKA stump cellulitis and complicated by [id number] discitis/osteomylitis. Patient was transferred to [facility] for MRI of back and TEE, but will likely need 8 weeks of IV antibiotic therapy despite the result of the imaging. Therefore, we will recommend to continue IV Daptomycin without performing the TEE or MRI at this time.   Plan: 1. Continue Daptomycin for 8 weeks   Principal Problem:   MRSA bacteremia Active Problems:   [name], right (HCC)   [name], type 2) ([place])   HTN (hypertension)   Discitis   . amLODipine  10 mg Oral Daily  . cloNIDine  0.3 mg Oral Q8H  . gabapentin  300 mg Oral TID  . heparin  5,000 Units Subcutaneous Q8H  . hydrALAZINE  25 mg Oral Q8H  . insulin aspart  0-9 Units Subcutaneous TID WC  . levothyroxine  50 mcg Oral Q0600  . metoprolol tartrate  50 mg Oral BID  . oxyCODONE  20 mg Oral Q8H    HPI: Thomas [name] is a 68 y.o. male with a pertinent PMH of diabetes mellitus type 2, HTN, CKD stage III, and anemia, who was admitted to [facility] with fever, chills and back pain. Previously had a right below the knee amputation and subsequently developed MRSA infection for which he was discharged with daptomycin, but did not receive the medication. Patient presented with a fever , leukocytosis and CT showing T10-11 disc disease concerning for discitis. XR of the patients BKA stump shows irregularity concerning for an infection. Patient was restarted on IV daptomycin and  transferred to [facility] for further evaluation and management.   On evaluation today, the patient continues to have significant pain is his back that limits his movement. He denies joint pain otherwise and does not admit to pain around his right BKA stump. He states that he does have some mild epigastric pain, but denies radiation of pain, shortness of breath, or fever.   Review of Systems: Review of Systems  Constitutional: Positive for chills, fever and malaise/fatigue.  Respiratory: Negative for cough and shortness of breath.   Cardiovascular: Positive for chest pain.  Genitourinary: Negative for dysuria.  Musculoskeletal: Positive for back pain. Negative for joint pain.  Neurological: Negative for focal weakness and weakness.    Past Medical History:  Diagnosis Date  . Arthritis   . Gout   . Hypertension   . Muscle pain   . Scars   . Swelling     Social History   Tobacco Use  . Smoking status: Never Smoker  . Smokeless tobacco: Never Used  Substance Use Topics  . Alcohol use: No  . Drug use: No    Family History  Family history unknown: Yes   Allergies  Allergen Reactions  . Other Other (See Comments)    Blood pressure issues Per [facility] hospital: Pt states he can only take these pain  meds or else he gets very sick: Oxycontin,morphine,demerol, and dilaudid are the only pain meds pt states he can take.    Marland Kitchen Oxycodone Other (See Comments)    Cases extreme changes in Blood pressure Cases extreme changes in Blood pressure   . Oxycodone-Acetaminophen Other (See Comments)    Blood pressure issues, rash from acetaminophen, pt states he can take tylenol though.  . Oxymorphone Other (See Comments)    Causes kidney problems  . Aspirin Nausea And Vomiting    Per [facility]  . Beta Vulgaris Other (See Comments)  . Buspirone Other (See Comments)  . Cabbage Other (See Comments)  . Codeine Other (See Comments)  . Fish Allergy Other (See Comments)  . Fish-Derived  Products   . Gabapentin Other (See Comments)    Per [facility] papers, severe internal bleeding from colon  . Pentazocine   . Propoxyphene Other (See Comments)  . Shellfish Allergy Other (See Comments)  . Shellfish-Derived Products   . Sulfa Antibiotics Hives  . Sulfasalazine Other (See Comments)  . Amoxicillin Rash    Per [facility]    OBJECTIVE: Blood pressure (!) 163/93, pulse 89, temperature 98.2 F (36.8 C), temperature source Oral, resp. rate 16, weight 119.3 kg, SpO2 96 %.  Physical Exam Constitutional:      General: He is in acute distress.  Eyes:     Extraocular Movements: Extraocular movements intact.  Neck:     Musculoskeletal: Normal range of motion.  Cardiovascular:     Rate and Rhythm: Normal rate and regular rhythm.     Pulses: Normal pulses.     Heart sounds: No murmur. No friction rub. No gallop.   Pulmonary:     Effort: No respiratory distress.     Breath sounds: Normal breath sounds.  Abdominal:     General: Abdomen is flat.     Tenderness: There is abdominal tenderness.  Musculoskeletal:        General: Tenderness (back) and deformity (Right BKA) present. No swelling.  Skin:    General: Skin is warm and dry.  Neurological:     General: No focal deficit present.     Mental Status: He is alert and oriented to person, place, and time.     Lab Results Lab Results  Component Value Date   WBC 7.1 08/31/2019   HGB 9.4 (L) 08/31/2019   HCT 30.1 (L) 08/31/2019   MCV 95.6 08/31/2019   PLT 216 08/31/2019    Lab Results  Component Value Date   CREATININE 3.10 (H) 08/31/2019   BUN 91 (H) 08/31/2019   NA 139 08/31/2019   K 4.3 08/31/2019   CL 103 08/31/2019   CO2 22 08/31/2019    Lab Results  Component Value Date   ALT 15 08/31/2019   AST 33 08/31/2019   ALKPHOS 102 08/31/2019   BILITOT 0.7 08/31/2019     Microbiology: No results found for this or any previous visit (from the past 240 hour(s)).  Marianna Payment, D.O. Date  08/31/2019 Time 1:26 PM [facility] Internal Medicine, PGY-1 Pager: [phone]   08/31/2019 1:05 PM

## 2019-08-31 NOTE — Progress Notes (Signed)
Pt has not allowed [place] to help him sit up, roll over, clean his back or move him at all. Therefore, his posterior has not been assessed.  He also won't allow me to move his legs or his left foot.   He allowed me to raise the head of his bed about 5 degrees and immediately demanded it lowered .  Pt was cautioned about dangers of eating while laying flat, pneumonia developing and pressure ulcers.  "when you fix my back and it doesn't hurt, I'll let you move me.  But not until then."  Dr. Ree Kida is aware of pt's refusal to move.

## 2019-08-31 NOTE — Progress Notes (Signed)
Patient arrived to 5W via care link. Patient alert and orientedx 4. On 1l o2 via Naomi. 20g PIV to LAC, 20G PIV to RAC. Foley catheter in place. Complains of back pain 10/10. Minor scratches noted to bilateral lower extremities. Right BKA noted to have scab on stump. Left heel noted to have large scab that is healing. Patient received CHG bath and foley care. Patient states he see's different things when he clothes his eyes. Patient's cell phone, glasses, watch and bible are at bedside. Dr. Hal Hope aware of new patient arrival. Will continue to monitor.

## 2019-08-31 NOTE — Progress Notes (Signed)
  Spoke with Dr. Ree Kida about Patrcia Dolly refusing cxr, says he needs to be put to sleep or numbed in order to even roll on his side or move.  Plan is wait on bc results, insert picc and hopefully go to SNF.

## 2019-08-31 NOTE — Progress Notes (Signed)
Pharmacy Antibiotic Note Thomas [name] is a 68 y.o. male transferred from [facility] on  08/31/2019 with MRSA bacteria, poss diskitis and osteomyeltis, and AKI.  Baseline SCr ~ 2.5, most recent SCr 3.9 on 11/9.  Pharmacy has been consulted for Daptomycin dosing.  Daptomycin 750 mg IV last given at [facility] on 11/9  at 1030 am  Plan: Daptomycin 750 mg IV q48h  Weight: 263 lb (119.3 kg)  Temp (24hrs), Avg:98.5 F (36.9 C), Min:98.3 F (36.8 C), Max:98.6 F (37 C)  No results for input(s): WBC, CREATININE, LATICACIDVEN, VANCOTROUGH, VANCOPEAK, VANCORANDOM, GENTTROUGH, GENTPEAK, GENTRANDOM, TOBRATROUGH, TOBRAPEAK, TOBRARND, AMIKACINPEAK, AMIKACINTROU, AMIKACIN in the last 168 hours.  CrCl cannot be calculated (No successful lab value found.).    Allergies  Allergen Reactions  . Other Other (See Comments)    Blood pressure issues Blood pressure issues   . Oxycodone Other (See Comments)    Cases extreme changes in Blood pressure Cases extreme changes in Blood pressure   . Oxycodone-Acetaminophen Other (See Comments)    Blood pressure issues  . Oxymorphone Other (See Comments)    Causes kidney problems  . Beta Vulgaris Other (See Comments)  . Buspirone Other (See Comments)  . Cabbage Other (See Comments)  . Codeine Other (See Comments)  . Fish Allergy Other (See Comments)  . Fish-Derived Products   . Pentazocine   . Propoxyphene Other (See Comments)  . Shellfish Allergy Other (See Comments)  . Shellfish-Derived Products   . Sulfa Antibiotics Hives  . Sulfasalazine Other (See Comments)    Thomas [name] 08/31/2019 4:04 AM

## 2019-08-31 NOTE — H&P (Signed)
History and Physical    Thomas [name] [id number] DOB: 1951/02/07 DOA: 08/31/2019  PCP: Antonietta Jewel, MD  Patient coming from: Patient was transferred from [facility].  Chief Complaint: MRSA bacteremia will need further work-up.  HPI: Thomas [name] is a 68 y.o. male with history of diabetes mellitus type 2, hypertension, chronic kidney disease stage III, anemia who was admitted at [facility] after having fever chills and back pain.  Patient does have a history of chronic back pain which acutely worsened.  Prior to this patient has had a right below-knee amputation following which patient was found to be infected with MRSA and was supposed to be on daptomycin but per discharge summary did not receive.  Prior to this admission patient had fever for 1 day.  As per the discharge summary COVID-19 test was negative.  Work-up in the ED showed leukocytosis with normocytic anemia hyponatremia creatinine of 3.7 baseline is usually around 2.7.  Urinalysis was positive for UTI chest x-ray showed nothing acute.  Right knee x-ray showed some irregularity suspected to be an infection.  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  Patient was placed on IV daptomycin.  Pain was worsening.  2D echo did not show anything acute.  Patient will need MRI with sedation and TEE for which patient was transferred to [facility].  On my exam patient still has back pain radiating to both legs which increased on movement of both legs.  Has had mild chest discomfort by the time I examined.  EKG troponin chest x-ray has been ordered all of which are pending.  Prior to transfer patient has had Foley catheter placement due to urinary retention.  ED Course: Patient is a direct admit.  Review of Systems: As per HPI, rest all negative.   Past Medical History:  Diagnosis Date  . Arthritis   . Gout   . Hypertension   . Muscle pain   . Scars   . Swelling     History  reviewed. No pertinent surgical history.   reports that he has never smoked. He has never used smokeless tobacco. He reports that he does not drink alcohol or use drugs.  Allergies  Allergen Reactions  . Other Other (See Comments)    Blood pressure issues Blood pressure issues   . Oxycodone Other (See Comments)    Cases extreme changes in Blood pressure Cases extreme changes in Blood pressure   . Oxycodone-Acetaminophen Other (See Comments)    Blood pressure issues  . Oxymorphone Other (See Comments)    Causes kidney problems  . Beta Vulgaris Other (See Comments)  . Buspirone Other (See Comments)  . Cabbage Other (See Comments)  . Codeine Other (See Comments)  . Fish Allergy Other (See Comments)  . Fish-Derived Products   . Pentazocine   . Propoxyphene Other (See Comments)  . Shellfish Allergy Other (See Comments)  . Shellfish-Derived Products   . Sulfa Antibiotics Hives  . Sulfasalazine Other (See Comments)    Family History  Family history unknown: Yes    Prior to Admission medications   Medication Sig Start Date End Date Taking? Authorizing Provider  amLODipine (NORVASC) 10 MG tablet Take 1 tablet by mouth daily.    [provider]  bethanechol (URECHOLINE) 50 MG tablet Take 50 mg by mouth.    [provider]  cloNIDine (CATAPRES) 0.3 MG tablet Take 0.3 mg by mouth.    [provider]  colchicine 0.6  MG tablet Take 0.6 mg by mouth.    [provider]  furosemide (LASIX) 40 MG tablet Take 40 mg by mouth 2 (two) times daily.    [provider]  gabapentin (NEURONTIN) 300 MG capsule  10/10/15   [provider]  glipiZIDE (GLUCOTROL XL) 10 MG 24 hr tablet  12/20/15   [provider]  HYDROmorphone (DILAUDID) 4 MG tablet Take 2 tablets by mouth every 8 (eight) hours as needed. 07/14/18   [provider]  Insulin Detemir (LEVEMIR FLEXTOUCH) 100 UNIT/ML Pen Inject 10 Units into the skin every morning.  08/21/18   [provider]  insulin regular (NOVOLIN R,HUMULIN R) 100 units/mL injection Inject 8 Units into the skin 3 (three) times daily before meals.    [provider]  Levothyroxine Sodium (SYNTHROID PO) Take by mouth.    [provider]  metoprolol tartrate (LOPRESSOR) 50 MG tablet Take 1 tablet by mouth 2 (two) times daily. 01/29/16   [provider]  Oxycodone HCl 20 MG TABS Take 1 tablet by mouth every 8 (eight) hours as needed.    [provider]  silver sulfADIAZINE (SILVADENE) 1 % cream Apply 1 application topically daily. 10/18/13   Bronson Ing, DPM  tiZANidine (ZANAFLEX) 4 MG tablet  01/02/16   [provider]  Vitamin D, Ergocalciferol, (DRISDOL) 1.25 MG (50000 UT) CAPS capsule Take 1 capsule by mouth once a week.    [provider]    Physical Exam: Constitutional: Moderately built and nourished. Vitals:   08/31/19 0128  BP: (!) 161/86  Pulse: 91  Resp: 16  Temp: 98.6 F (37 C)  TempSrc: Oral  SpO2: 96%   Eyes: Anicteric no pallor. ENMT: No discharge from the ears eyes nose or mouth. Neck: No mass or.  No neck rigidity. Respiratory: No rhonchi or crepitations. Cardiovascular: S1-S2 heard. Abdomen: Soft nontender bowel sounds present. Musculoskeletal: No edema.  Right BKA stump does not have any discharge. Skin: Right BKA stump respiratory discharge. Neurologic: Alert awake oriented to time place and person.  Moves all extremities but moving of the lower extremity increases her back pain. Psychiatric: Appears normal per normal affect.   Labs on Admission: I have personally reviewed following labs and imaging studies  CBC: No results for input(s): WBC, NEUTROABS, HGB, HCT, MCV, PLT in the last 168 hours. Basic Metabolic Panel: No results for input(s): NA, K, CL, CO2, GLUCOSE, BUN, CREATININE, CALCIUM, MG, PHOS in the last 168 hours. GFR: CrCl cannot be calculated (No successful lab value  found.). Liver Function Tests: No results for input(s): AST, ALT, ALKPHOS, BILITOT, PROT, ALBUMIN in the last 168 hours. No results for input(s): LIPASE, AMYLASE in the last 168 hours. No results for input(s): AMMONIA in the last 168 hours. Coagulation Profile: No results for input(s): INR, PROTIME in the last 168 hours. Cardiac Enzymes: No results for input(s): CKTOTAL, CKMB, CKMBINDEX, TROPONINI in the last 168 hours. BNP (last 3 results) No results for input(s): PROBNP in the last 8760 hours. HbA1C: No results for input(s): HGBA1C in the last 72 hours. CBG: No results for input(s): GLUCAP in the last 168 hours. Lipid Profile: No results for input(s): CHOL, HDL, LDLCALC, TRIG, CHOLHDL, LDLDIRECT in the last 72 hours. Thyroid Function Tests: No results for input(s): TSH, T4TOTAL, FREET4, T3FREE, THYROIDAB in the last 72 hours. Anemia Panel: No results for input(s): VITAMINB12, FOLATE, FERRITIN, TIBC, IRON, RETICCTPCT in the last 72 hours. Urine analysis: No results found for: COLORURINE, APPEARANCEUR,  LABSPEC, PHURINE, GLUCOSEU, HGBUR, BILIRUBINUR, KETONESUR, PROTEINUR, UROBILINOGEN, NITRITE, LEUKOCYTESUR Sepsis Labs: @LABRCNTIP (procalcitonin:4,lacticidven:4) )No results found for this or any previous visit (from the past 240 hour(s)).   Radiological Exams on Admission: No results found.    Assessment/Plan Principal Problem:   MRSA bacteremia Active Problems:   [name], right (HCC)   [name], type 2) (HCC)   HTN (hypertension)   Discitis    1. MRSA bacteremia with the CT abdomen showing discitis involving the T10 and T11 area.  Will need TEE and also MRI of the L-spine with sedation.  Presently on daptomycin.  Please consult infectious disease. 2. Hypertension uncontrolled on hydralazine clonidine beta-blockers as needed IV hydralazine. 3. Diabetes mellitus type 2 we will keep patient on sliding scale coverage. 4. Acute on chronic kidney disease  stage IV baseline creatinine is around 2.7 L increased to more than 3 now. 5. Anemia likely from renal disease baseline hemoglobin around 9. 6. Chronic back pain on OxyContin and also is on as needed IV morphine now. 7. History of right below-knee amputation recent.   DVT prophylaxis: Heparin. Code Status: Full code. Family Communication: Discussed with patient. Disposition Plan: To be determined. Consults called: None. Admission status: Inpatient.   [name] MD [facility] Pager [phone].  If 7PM-7AM, please contact night-coverage [email or web address] Password [email or web address]  08/31/2019, 3:01 AM

## 2019-08-31 NOTE — Progress Notes (Signed)
Patient refused chest xray. Patient states I am in too much pain and I don't want to move right now.

## 2019-08-31 NOTE — Progress Notes (Signed)
Pharmacy Antibiotic Note  Thomas [name] is a 68 y.o. male admitted on 08/31/2019 with MRSA bacteremia and discitis.  Pharmacy has been consulted for daptomycin dosing. Per [facility] records, 11/6 blood cultures positive for MRSA.   Plan: Increase daptomycin dose to 1000 mg IV every 48 hours. Monitor CK weekly, renal function, and clinical status.   Weight: 263 lb (119.3 kg)  Temp (24hrs), Avg:98.4 F (36.9 C), Min:98.2 F (36.8 C), Max:98.6 F (37 C)  Recent Labs  Lab 08/31/19 0445  WBC 7.1  CREATININE 3.10*    CrCl cannot be calculated (Unknown ideal weight.).    Allergies  Allergen Reactions  . Other Other (See Comments)    Blood pressure issues Blood pressure issues   . Oxycodone Other (See Comments)    Cases extreme changes in Blood pressure Cases extreme changes in Blood pressure   . Oxycodone-Acetaminophen Other (See Comments)    Blood pressure issues  . Oxymorphone Other (See Comments)    Causes kidney problems  . Beta Vulgaris Other (See Comments)  . Buspirone Other (See Comments)  . Cabbage Other (See Comments)  . Codeine Other (See Comments)  . Fish Allergy Other (See Comments)  . Fish-Derived Products   . Pentazocine   . Propoxyphene Other (See Comments)  . Shellfish Allergy Other (See Comments)  . Shellfish-Derived Products   . Sulfa Antibiotics Hives  . Sulfasalazine Other (See Comments)    Thank you for allowing pharmacy to be a part of this patient's care.  Ladoris Gene, PharmD Candidate 08/31/2019 12:13 PM

## 2019-09-01 DIAGNOSIS — N179 Acute kidney failure, unspecified: Secondary | ICD-10-CM

## 2019-09-01 DIAGNOSIS — R7881 Bacteremia: Secondary | ICD-10-CM | POA: Diagnosis not present

## 2019-09-01 DIAGNOSIS — M4624 Osteomyelitis of vertebra, thoracic region: Secondary | ICD-10-CM | POA: Diagnosis not present

## 2019-09-01 DIAGNOSIS — Z794 Long term (current) use of insulin: Secondary | ICD-10-CM

## 2019-09-01 DIAGNOSIS — E1169 Type 2 diabetes mellitus with other specified complication: Secondary | ICD-10-CM

## 2019-09-01 DIAGNOSIS — B9562 Methicillin resistant Staphylococcus aureus infection as the cause of diseases classified elsewhere: Secondary | ICD-10-CM | POA: Diagnosis not present

## 2019-09-01 DIAGNOSIS — I10 Essential (primary) hypertension: Secondary | ICD-10-CM

## 2019-09-01 DIAGNOSIS — M4644 Discitis, unspecified, thoracic region: Secondary | ICD-10-CM | POA: Diagnosis not present

## 2019-09-01 LAB — CBC
HCT: 31.6 % — ABNORMAL LOW (ref 39.0–52.0)
Hemoglobin: 9.6 g/dL — ABNORMAL LOW (ref 13.0–17.0)
MCH: 29.3 pg (ref 26.0–34.0)
MCHC: 30.4 g/dL (ref 30.0–36.0)
MCV: 96.3 fL (ref 80.0–100.0)
Platelets: 232 10*3/uL (ref 150–400)
RBC: 3.28 MIL/uL — ABNORMAL LOW (ref 4.22–5.81)
RDW: 16 % — ABNORMAL HIGH (ref 11.5–15.5)
WBC: 7.1 10*3/uL (ref 4.0–10.5)
nRBC: 0 % (ref 0.0–0.2)

## 2019-09-01 LAB — BASIC METABOLIC PANEL
Anion gap: 13 (ref 5–15)
BUN: 86 mg/dL — ABNORMAL HIGH (ref 8–23)
CO2: 24 mmol/L (ref 22–32)
Calcium: 8.7 mg/dL — ABNORMAL LOW (ref 8.9–10.3)
Chloride: 101 mmol/L (ref 98–111)
Creatinine, Ser: 2.83 mg/dL — ABNORMAL HIGH (ref 0.61–1.24)
GFR calc Af Amer: 26 mL/min — ABNORMAL LOW (ref >60)
GFR calc non Af Amer: 22 mL/min — ABNORMAL LOW (ref >60)
Glucose, Bld: 129 mg/dL — ABNORMAL HIGH (ref 70–99)
Potassium: 4.9 mmol/L (ref 3.5–5.1)
Sodium: 138 mmol/L (ref 135–145)

## 2019-09-01 LAB — GLUCOSE, CAPILLARY
Glucose-Capillary: 139 mg/dL — ABNORMAL HIGH (ref 70–99)
Glucose-Capillary: 160 mg/dL — ABNORMAL HIGH (ref 70–99)
Glucose-Capillary: 150 mg/dL — ABNORMAL HIGH (ref 70–99)
Glucose-Capillary: 170 mg/dL — ABNORMAL HIGH (ref 70–99)

## 2019-09-01 LAB — CK: Total CK: 187 U/L (ref 49–397)

## 2019-09-01 MED ORDER — METHOCARBAMOL 500 MG PO TABS
500.0000 mg | ORAL_TABLET | Freq: Three times a day (TID) | ORAL | Status: DC | PRN
  Administered 2019-09-01 – 2019-09-02 (×3): 500 mg via ORAL
  Filled 2019-09-01 (×3): qty 1

## 2019-09-01 MED ORDER — DIAZEPAM 2 MG PO TABS
2.0000 mg | ORAL_TABLET | Freq: Once | ORAL | Status: AC
  Administered 2019-09-01: 2 mg via ORAL
  Filled 2019-09-01: qty 1

## 2019-09-01 NOTE — Evaluation (Signed)
Occupational Therapy Evaluation Patient Details Name: Thomas [name] MRN: [id number] DOB: 04/13/1951 Today's Date: 09/01/2019    History of Present Illness Patient is a 68 year old male history of diabetes mellitus type 2, hypertension, chronic kidney disease stage III, anemia, R BKA Sept 2019, who was admitted at [facility] after having fever chills and back pain. Patient transferred from [facility] for MRSA bacteremia. CT abdomen showing discitis involvling T10, T11 area.   Clinical Impression   Patient is a 68 year old male that lives alone in a single level house with 1 step to enter. Patient reports he is independent with his self care, drives himself and was going to chiropractor and "rehab gym" where he used 8 different machines himself. Patient reports he received his R LE prosthesis "last Monday" and was able to walk without "a cane or nothin." Currently, patient is in 8/10 pain from his pelvis up to his neck stating it makes it hard for him to take a deep breath. Patient educated on benefits of mobility however patient declined at this time. OT to follow up for mobility assessment.     Follow Up Recommendations  SNF;Other (comment)(currently recommend SNF due to decline of mobility)    Equipment Recommendations  Other (comment)(to be determined)       Precautions / Restrictions Precautions Precautions: Fall Precaution Comments: Contact precautions MRSA Restrictions Weight Bearing Restrictions: No      Mobility Bed Mobility               General bed mobility comments: Patient deferred any bed mobility due to pain  Transfers                 General transfer comment: patient declined/unable due to pain        ADL either performed or assessed with clinical judgement   ADL Overall ADL's : Needs assistance/impaired     Grooming: Set up;Bed level   Upper Body Bathing: Set up;Bed level   Lower Body Bathing: Total assistance;Bed level    Upper Body Dressing : Set up;Bed level   Lower Body Dressing: Sitting/lateral leans;Total assistance;Bed level                       Vision Baseline Vision/History: Wears glasses Wears Glasses: At all times Patient Visual Report: No change from baseline              Pertinent Vitals/Pain Pain Assessment: 0-10 Pain Score: 8  Pain Location: Back Pain Descriptors / Indicators: Grimacing;Heaviness Pain Intervention(s): Limited activity within patient's tolerance;Patient requesting pain meds-RN notified     Hand Dominance Right   Extremity/Trunk Assessment Upper Extremity Assessment Upper Extremity Assessment: Generalized weakness   Lower Extremity Assessment Lower Extremity Assessment: Defer to PT evaluation       Communication Communication Communication: No difficulties   Cognition Arousal/Alertness: Awake/alert Behavior During Therapy: WFL for tasks assessed/performed Overall Cognitive Status: Within Functional Limits for tasks assessed                                 General Comments: Tangential   General Comments       Exercises Exercises: General Upper Extremity General Exercises - Upper Extremity Shoulder Flexion: AROM;Both;5 reps;Supine Elbow Flexion: AROM;Both;5 reps;Supine Elbow Extension: AROM;Both;5 reps;Supine        Home Living Family/patient expects to be discharged to:: Private residence Living Arrangements: Alone Available Help at  Discharge: Neighbor;Available PRN/intermittently Type of Home: House Home Access: Stairs to enter [employer] of Steps: 1   Home Layout: One level     Bathroom Shower/Tub: Teacher, early years/pre: Standard     Home Equipment: Cane - single point;Crutches   Additional Comments: Patient reports floors are not completely level, and that carpeting will not allow him to use wheelchair or walker.      Prior Functioning/Environment Level of Independence:  Independent with assistive device(s)        Comments: patient reports ambulating with a cane and a crutch for 3 months. Reports just received R LE prosthesis "last monday."         OT Problem List: Decreased strength;Decreased activity tolerance;Pain      OT Treatment/Interventions: Self-care/ADL training;Therapeutic exercise;DME and/or AE instruction;Therapeutic activities;Patient/family education;Balance training    OT Goals(Current goals can be found in the care plan section) Acute Rehab OT Goals Patient Stated Goal: to get some rest OT Goal Formulation: With patient Time For Goal Achievement: 09/15/19 Potential to Achieve Goals: Good  OT Frequency: Min 2X/week    AM-PAC OT "6 Clicks" Daily Activity     Outcome Measure Help from another person eating meals?: None Help from another person taking care of personal grooming?: A Little Help from another person toileting, which includes using toliet, bedpan, or urinal?: Total Help from another person bathing (including washing, rinsing, drying)?: Total Help from another person to put on and taking off regular upper body clothing?: A Little Help from another person to put on and taking off regular lower body clothing?: Total 6 Click Score: 13   End of Session Nurse Communication: Patient requests pain meds  Activity Tolerance: Patient limited by pain Patient left: in bed;with call bell/phone within reach;with bed alarm set  OT Visit Diagnosis: Pain Pain - part of body: (Back)                Time: 1410-1443 OT Time Calculation (min): 33 min Charges:  OT General Charges $OT Visit: 1 Visit OT Evaluation $OT Eval Moderate Complexity: 1 Mod  Shon Millet OT OT office: [place] 09/01/2019, 3:03 PM

## 2019-09-01 NOTE — Progress Notes (Signed)
PROGRESS NOTE    Thomas [name]  [id number] DOB: [birth year] DOA: 08/31/2019 PCP: Antonietta Jewel, MD   Brief Narrative:  HPI on 08/31/2019 by Dr. Gean [name] Thomas [name] Thomas [name] is a 68 y.o. male with history of diabetes mellitus type 2, hypertension, chronic kidney disease stage III, anemia who was admitted at [facility] after having fever chills and back pain.  Patient does have a history of chronic back pain which acutely worsened.  Prior to this patient has had a right below-knee amputation following which patient was found to be infected with MRSA and was supposed to be on daptomycin but per discharge summary did not receive.  Prior to this admission patient had fever for 1 day.  As per the discharge summary COVID-19 test was negative.  Work-up in the ED showed leukocytosis with normocytic anemia hyponatremia creatinine of 3.7 baseline is usually around 2.7.  Urinalysis was positive for UTI chest x-ray showed nothing acute.  Right knee x-ray showed some irregularity suspected to be an infection.  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  Patient was placed on IV daptomycin.  Pain was worsening.  2D echo did not show anything acute.  Patient will need MRI with sedation and TEE for which patient was transferred to [facility].  On my exam patient still has back pain radiating to both legs which increased on movement of both legs.  Has had mild chest discomfort by the time I examined.  EKG troponin chest x-ray has been ordered all of which are pending.  Prior to transfer patient has had Foley catheter placement due to urinary retention.  Interim history Patient transferred from [facility] for MRSA bacteremia with possible need of TEE and MRI.  Discussed with infectious disease, Dr. Linus Salmons, TEE and MRI not needed at this time.  Recommended repeat TTE in 4 to [redacted] weeks along with IV antibiotics for approximately 8 weeks.  Currently pending blood  cultures. Assessment & Plan   MRSA bacteremia/discitis -CT abdomen showing discitis involvling T10, T11 area  -Currently on daptomycin -Infectious disease consulted and associated - Discussed with Dr. Linus Salmons- no need for MRI as CT showed discitis. Patient had a TTE which was not concerning for vegetation.  Recommended repeat TTE in 4 to 6 weeks.  Recommended IV antibiotics of daptomycin for 8 weeks.  Patient will need a PICC line when blood cultures are negative for 48 hours.  Monitor CK levels. 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 Pending blood culture results. Will likely need SNF  Consultants Infectious disease  Procedures  None  Antibiotics   Anti-infectives (From admission, onward)   Start     Dose/Rate Route Frequency Ordered Stop   09/02/19 2000  DAPTOmycin (CUBICIN) 1,000 mg in sodium chloride 0.9 % IVPB     1,000 mg 240 mL/hr over 30 Minutes Intravenous Every  48 hours 08/31/19 1205     08/31/19 1000  DAPTOmycin (CUBICIN) 750 mg in sodium chloride 0.9 % IVPB  Status:  Discontinued     750 mg 230 mL/hr over 30 Minutes Intravenous Every 48 hours 08/31/19 0414 08/31/19 1205      Subjective:   Thomas [name] seen and examined today.  Continues to  complain of pain all over, particularly in his back.  Complains of back spasms.  Feels that stretching helps.  Denies current chest pain, shortness of breath, abdominal pain, nausea or vomiting, diarrhea or constipation, dizziness or headache.  Objective:   Vitals:   08/31/19 1628 08/31/19 2153 09/01/19 0626 09/01/19 0700  BP: (!) 141/80 (!) 165/95 (!) 169/95   Pulse:  (!) 102 93   Resp:   16   Temp: 97.9 F (36.6 C) 98.2 F (36.8 C) 97.9 F (36.6 C)   TempSrc: Oral Oral Oral   SpO2: 97% 95% 95%   Weight:    117 kg    Intake/Output Summary (Last 24 hours) at 09/01/2019 0855 Last data filed at 09/01/2019 0200 Gross per 24 hour  Intake 2037 ml  Output 1900 ml  Net 137 ml   Filed Weights   08/31/19 0352 09/01/19 0700  Weight: 119.3 kg 117 kg    Exam  General: Well developed, chronically ill appearing, NAD  HEENT: NCAT, mucous membranes moist.   Cardiovascular: S1 S2 auscultated, RRR, no murmur  Respiratory: Clear to auscultation bilaterally  Abdomen: Soft, nontender, nondistended, + bowel sounds  Extremities: warm dry without cyanosis clubbing or edema of LLE. R BKA  Neuro: AAOx3, nonfocal  Psych: Appropriate mood and affect   Data Reviewed: I have personally reviewed following labs and imaging studies  CBC: Recent Labs  Lab 08/31/19 0445 09/01/19 0448  WBC 7.1 7.1  NEUTROABS 5.2  --   HGB 9.4* 9.6*  HCT 30.1* 31.6*  MCV 95.6 96.3  PLT 216 [id number]   Basic Metabolic Panel: Recent Labs  Lab 08/31/19 0445 09/01/19 0448  NA 139 138  K 4.3 4.9  CL 103 101  CO2 22 24  GLUCOSE 169* 129*  BUN 91* 86*  CREATININE 3.10* 2.83*  CALCIUM 8.7* 8.7*   GFR: CrCl cannot be calculated (Unknown ideal weight.). Liver Function Tests: Recent Labs  Lab 08/31/19 0445  AST 33  ALT 15  ALKPHOS 102  BILITOT 0.7  PROT 7.5  ALBUMIN 2.4*   No results for input(s): LIPASE, AMYLASE in the last 168 hours. No results for input(s): AMMONIA in the last 168 hours.  Coagulation Profile: No results for input(s): INR, PROTIME in the last 168 hours. Cardiac Enzymes: Recent Labs  Lab 09/01/19 0448  CKTOTAL 187   BNP (last 3 results) No results for input(s): PROBNP in the last 8760 hours. HbA1C: No results for input(s): HGBA1C in the last 72 hours. CBG: Recent Labs  Lab 08/31/19 0749 08/31/19 1212 08/31/19 1622 08/31/19 2153  GLUCAP 204* 152* 141* 130*   Lipid Profile: No results for input(s): CHOL, HDL, LDLCALC, TRIG, CHOLHDL, LDLDIRECT in the last 72 hours. Thyroid Function Tests: No results for input(s): TSH, T4TOTAL, FREET4, T3FREE, THYROIDAB in the last 72 hours. Anemia Panel: No results for input(s): VITAMINB12, FOLATE, FERRITIN, TIBC, IRON, RETICCTPCT in the last 72 hours. Urine analysis: No results found for: COLORURINE, APPEARANCEUR, LABSPEC, PHURINE, GLUCOSEU, HGBUR, BILIRUBINUR, KETONESUR, PROTEINUR, UROBILINOGEN, NITRITE, LEUKOCYTESUR Sepsis Labs: @LABRCNTIP (procalcitonin:4,lacticidven:4)  ) Recent Results (from the past 240 hour(s))  SARS CORONAVIRUS 2 (TAT 6-24 HRS) Nasopharyngeal  Nasopharyngeal Swab     Status: None   Collection Time: 08/31/19  6:24 AM   Specimen: Nasopharyngeal Swab  Result Value Ref Range Status   SARS Coronavirus 2 NEGATIVE NEGATIVE Final    Comment: (NOTE) SARS-CoV-2 target nucleic acids are NOT DETECTED. The SARS-CoV-2 RNA is generally detectable in upper and lower respiratory specimens during the acute phase of infection. Negative results do not preclude SARS-CoV-2 infection, do not rule out co-infections with other pathogens, and should not be used as the sole basis for treatment or other patient management decisions. Negative results must be combined with clinical observations, patient history, and epidemiological information. The expected result is Negative. Fact Sheet for Patients: SugarRoll.be Fact Sheet for Healthcare Providers:  https://www.woods-mathews.com/ This test is not yet approved or cleared by the [place] FDA and  has been authorized for detection and/or diagnosis of SARS-CoV-2 by FDA under an Emergency Use Authorization (EUA). This EUA will remain  in effect (meaning this test can be used) for the duration of the COVID-19 declaration under Section 56 4(b)(1) of the Act, 21 U.S.C. section 360bbb-3(b)(1), unless the authorization is terminated or revoked sooner. Performed at [facility], [place] [address] [place], [place] [address]   Culture, blood (routine x 2)     Status: None (Preliminary result)   Collection Time: 08/31/19 12:35 PM   Specimen: BLOOD RIGHT HAND  Result Value Ref Range Status   Specimen Description BLOOD RIGHT HAND  Final   Special Requests   Final    BOTTLES DRAWN AEROBIC ONLY Blood Culture adequate volume   Culture   Final    NO GROWTH < 24 HOURS Performed at [facility], [place] [address], [place], [place] [address]    Report Status PENDING  Incomplete  Culture, blood (routine x 2)     Status: None (Preliminary result)   Collection Time: 08/31/19  1:23 PM   Specimen: BLOOD RIGHT HAND  Result Value Ref Range Status   Specimen Description BLOOD RIGHT HAND  Final   Special Requests   Final    BOTTLES DRAWN AEROBIC ONLY Blood Culture adequate volume   Culture   Final    NO GROWTH < 24 HOURS Performed at [facility], [place] [address] [place], [place] [address]    Report Status PENDING  Incomplete      Radiology Studies: Dg Chest Port 1 View  Result Date: 08/31/2019 CLINICAL DATA:  Chest pain and shortness of breath EXAM: PORTABLE CHEST 1 VIEW COMPARISON:  08/26/2019 FINDINGS: Cardiac shadow is enlarged but stable. Postsurgical changes are again seen. The lungs are well aerated bilaterally. No focal infiltrate or sizable effusion is seen. Mild persistent left basilar atelectasis is noted. IMPRESSION: Stable left basilar atelectasis. No other  focal abnormality is seen. Electronically Signed   By: Inez Catalina M.D.   On: 08/31/2019 20:24     Scheduled Meds: . amLODipine  10 mg Oral Daily  . Chlorhexidine Gluconate Cloth  6 each Topical Daily  . cloNIDine  0.3 mg Oral Q8H  . gabapentin  300 mg Oral TID  . heparin  5,000 Units Subcutaneous Q8H  . hydrALAZINE  25 mg Oral Q8H  . insulin aspart  0-9 Units Subcutaneous TID WC  . levothyroxine  50 mcg Oral Q0600  . metoprolol tartrate  50 mg Oral BID  . oxyCODONE  20 mg Oral Q8H   Continuous Infusions: . [START ON 09/02/2019] DAPTOmycin (CUBICIN)  IV  LOS: 1 day   Time Spent in minutes   30 minutes  Kamori Kitchens D.O. on 09/01/2019 at 8:55 AM  Between 7am to 7pm - Please see pager noted on [email or web address]  After 7pm go to [email or web address]  And look for the night coverage person covering for me after hours  [facility] Office  [phone]

## 2019-09-01 NOTE — Evaluation (Signed)
Physical Therapy Evaluation Patient Details Name: Thomas [name] MRN: [id number] DOB: [birth year] Today's Date: 09/01/2019   History of Present Illness  Patient is a 68 year old male history of diabetes mellitus type 2, hypertension, chronic kidney disease stage III, anemia, R BKA Sept 2019, who was admitted at [facility] after having fever chills and back pain. Patient transferred from [facility] for MRSA bacteremia. CT abdomen showing discitis involvling T10, T11 area.  Clinical Impression   Patient received in bed, pleasant but continuing to be severely pain limited this afternoon; pain highly irritable and becomes exacerbated even when patient is hiccuping, he thus declines attempts at bed mobility today. Performed MMT- see below for details. Noted small scab on incision line of residual limb in combination with reduced light touch sensation, which is concerning given recent use of new prosthetic prior to hospitalization. He was left in bed with all needs met, bed alarm active, and RN aware of patient status.     Follow Up Recommendations SNF(due to decline in strength and mobility)    Equipment Recommendations  Other (comment)(defer)    Recommendations for Other Services       Precautions / Restrictions Precautions Precautions: Fall Precaution Comments: Contact precautions MRSA, scab on incision of residual limb Restrictions Weight Bearing Restrictions: No      Mobility  Bed Mobility               General bed mobility comments: Patient deferred any bed mobility due to pain  Transfers                 General transfer comment: patient declined/unable due to pain  Ambulation/Gait             General Gait Details: unable due to pain/declined  Stairs            Wheelchair Mobility    Modified Rankin (Stroke Patients Only)       Balance                                             Pertinent Vitals/Pain Pain  Assessment: 0-10 Pain Score: 8  Pain Location: Back Pain Descriptors / Indicators: Grimacing;Heaviness;Moaning Pain Intervention(s): Limited activity within patient's tolerance;Monitored during session;Premedicated before session    [place] expects to be discharged to:: Private residence Living Arrangements: Alone Available Help at Discharge: Neighbor;Available PRN/intermittently Type of Home: House Home Access: Stairs to enter   [employer] of Steps: 1 Home Layout: One level Home Equipment: Cane - single point;Crutches Additional Comments: Patient reports floors are not completely level, and that carpeting will not allow him to use wheelchair or walker.    Prior Function Level of Independence: Independent with assistive device(s)         Comments: patient reports ambulating with a cane and a crutch for 3 months. Reports just received R LE prosthesis "last monday."      Hand Dominance   Dominant Hand: Right    Extremity/Trunk Assessment   Upper Extremity Assessment Upper Extremity Assessment: Defer to OT evaluation    Lower Extremity Assessment Lower Extremity Assessment: RLE deficits/detail;LLE deficits/detail RLE Deficits / Details: hip flexor 5/5, hip abductor supine 5/5, quad 4+/5; small scab noted on incision line of residual limb RLE Sensation: decreased light touch LLE Deficits / Details: hip flexion 3/5, hip abduction supine 3/5, quad 4/5  Communication   Communication: No difficulties  Cognition Arousal/Alertness: Awake/alert Behavior During Therapy: WFL for tasks assessed/performed Overall Cognitive Status: Within Functional Limits for tasks assessed                                 General Comments: Tangential      General Comments General comments (skin integrity, edema, etc.): unable to assess balance today due to not getting to EOB secondary to pain    Exercises    Assessment/Plan    PT  Assessment Patient needs continued PT services  PT Problem List Decreased strength;Obesity;Decreased activity tolerance;Decreased safety awareness;Decreased balance;Decreased mobility;Pain       PT Treatment Interventions DME instruction;Balance training;Gait training;Neuromuscular re-education;Stair training;Functional mobility training;Patient/family education;Therapeutic activities;Therapeutic exercise    PT Goals (Current goals can be found in the Care Plan section)  Acute Rehab PT Goals Patient Stated Goal: to get some rest, less pain PT Goal Formulation: With patient Time For Goal Achievement: 09/15/19 Potential to Achieve Goals: Fair    Frequency Min 3X/week   Barriers to discharge        Co-evaluation               AM-PAC PT "6 Clicks" Mobility  Outcome Measure Help needed turning from your back to your side while in a flat bed without using bedrails?: A Lot Help needed moving from lying on your back to sitting on the side of a flat bed without using bedrails?: A Lot Help needed moving to and from a bed to a chair (including a wheelchair)?: A Lot Help needed standing up from a chair using your arms (e.g., wheelchair or bedside chair)?: A Lot Help needed to walk in hospital room?: Total Help needed climbing 3-5 steps with a railing? : Total 6 Click Score: 10    End of Session   Activity Tolerance: Patient limited by pain Patient left: in bed;with call bell/phone within reach;with bed alarm set Nurse Communication: Other (comment)(patient with question about muscle relaxer/asking about hiccup meds) PT Visit Diagnosis: Difficulty in walking, not elsewhere classified (R26.2);Muscle weakness (generalized) (M62.81);Unsteadiness on feet (R26.81)    Time: 3419-6222 PT Time Calculation (min) (ACUTE ONLY): 12 min   Charges:   PT Evaluation $PT Eval Moderate Complexity: 1 Mod          Windell Norfolk, DPT, CBIS  Supplemental Physical Therapist [place]     Pager [phone] Acute Rehab Office [phone]

## 2019-09-01 NOTE — Progress Notes (Signed)
[place] for Infectious Disease  Date of Admission:  08/31/2019     Total days of antibiotics 3         ASSESSMENT:  Mr. 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 No endocarditis is present on TEE. Repeat blood cultures from 08/31/19 are without growth to date. He has acute kidney injury and will require central line placement by IR. Plan remains to treat for 8 weeks with Daptomycin. CK level reviewed with no significant findings.    PLAN:  1. Continue daptomycin. 2. Will plan central line placement in the next 24-48 hours once blood cultures remain clear 3. Monitor repeat cultures. 4. Monitor CK levels while on daptomycin per protocol. 5. Pain management per primary team.   Principal Problem:   MRSA bacteremia Active Problems:   [name], right ([place])   [name], type 2) ([place])   HTN (hypertension)   Discitis   . amLODipine  10 mg Oral Daily  . Chlorhexidine Gluconate Cloth  6 each Topical Daily  . cloNIDine  0.3 mg Oral Q8H  . heparin  5,000 Units Subcutaneous Q8H  . hydrALAZINE  25 mg Oral Q8H  . insulin aspart  0-9 Units Subcutaneous TID WC  . levothyroxine  50 mcg Oral Q0600  . metoprolol tartrate  50 mg Oral BID  . oxyCODONE  20 mg Oral Q8H    SUBJECTIVE:  Afebrile overnight with no acute events. Has refused some nursing care due to pain. Continues to have back pain and recently given muscle relaxers which he hopes will help.   Allergies  Allergen Reactions  . Other Other (See Comments)    Blood pressure issues Per [facility] hospital: Pt states he can only take these pain meds or else he gets very sick: Oxycontin,morphine,demerol, and dilaudid are the only pain meds pt states he can take.    Marland Kitchen Oxycodone Other (See Comments)    Cases extreme changes in Blood pressure Cases extreme changes in Blood pressure   .  Oxycodone-Acetaminophen Other (See Comments)    Blood pressure issues, rash from acetaminophen, pt states he can take tylenol though.  . Oxymorphone Other (See Comments)    Causes kidney problems  . Aspirin Nausea And Vomiting    Per [facility]  . Beta Vulgaris Other (See Comments)  . Buspirone Other (See Comments)  . Cabbage Other (See Comments)  . Codeine Other (See Comments)  . Fish Allergy Other (See Comments)  . Fish-Derived Products   . Gabapentin Other (See Comments)    Per [facility] papers, severe internal bleeding from colon  . Pentazocine   . Propoxyphene Other (See Comments)  . Shellfish Allergy Other (See Comments)  . Shellfish-Derived Products   . Sulfa Antibiotics Hives  . Sulfasalazine Other (See Comments)  . Amoxicillin Rash    Per [facility]     Review of Systems: Review of Systems  Constitutional: Negative for chills, fever and weight loss.  Respiratory: Negative for cough, shortness of breath and wheezing.   Cardiovascular: Negative for chest pain and leg swelling.  Gastrointestinal: Negative for abdominal pain, constipation, diarrhea, nausea and vomiting.  Musculoskeletal: Positive for back pain.  Skin: Negative for rash.      OBJECTIVE: Vitals:   08/31/19 1628 08/31/19 2153 09/01/19 0626 09/01/19 0700  BP: (!) 141/80 (!) 165/95 (!) 169/95   Pulse:  (!) 102 93   Resp:  16   Temp: 97.9 F (36.6 C) 98.2 F (36.8 C) 97.9 F (36.6 C)   TempSrc: Oral Oral Oral   SpO2: 97% 95% 95%   Weight:    117 kg   Body mass index is 34.98 kg/m.  Physical Exam Constitutional:      General: He is not in acute distress.    Appearance: He is well-developed.     Comments: Lying in bed flat; pleasant.   Cardiovascular:     Rate and Rhythm: Normal rate and regular rhythm.     Heart sounds: Normal heart sounds.  Pulmonary:     Effort: Pulmonary effort is normal.     Breath sounds: Normal breath sounds.  Skin:    General: Skin is warm  and dry.  Neurological:     Mental Status: He is alert and oriented to person, place, and time.  Psychiatric:        Behavior: Behavior normal.        Thought Content: Thought content normal.        Judgment: Judgment normal.     Lab Results Lab Results  Component Value Date   WBC 7.1 09/01/2019   HGB 9.6 (L) 09/01/2019   HCT 31.6 (L) 09/01/2019   MCV 96.3 09/01/2019   PLT 232 09/01/2019    Lab Results  Component Value Date   CREATININE 2.83 (H) 09/01/2019   BUN 86 (H) 09/01/2019   NA 138 09/01/2019   K 4.9 09/01/2019   CL 101 09/01/2019   CO2 24 09/01/2019    Lab Results  Component Value Date   ALT 15 08/31/2019   AST 33 08/31/2019   ALKPHOS 102 08/31/2019   BILITOT 0.7 08/31/2019     Microbiology: Recent Results (from the past 240 hour(s))  SARS CORONAVIRUS 2 (TAT 6-24 HRS) Nasopharyngeal Nasopharyngeal Swab     Status: None   Collection Time: 08/31/19  6:24 AM   Specimen: Nasopharyngeal Swab  Result Value Ref Range Status   SARS Coronavirus 2 NEGATIVE NEGATIVE Final    Comment: (NOTE) SARS-CoV-2 target nucleic acids are NOT DETECTED. The SARS-CoV-2 RNA is generally detectable in upper and lower respiratory specimens during the acute phase of infection. Negative results do not preclude SARS-CoV-2 infection, do not rule out co-infections with other pathogens, and should not be used as the sole basis for treatment or other patient management decisions. Negative results must be combined with clinical observations, patient history, and epidemiological information. The expected result is Negative. Fact Sheet for Patients: SugarRoll.be Fact Sheet for Healthcare Providers: https://www.woods-mathews.com/ This test is not yet approved or cleared by the [place] FDA and  has been authorized for detection and/or diagnosis of SARS-CoV-2 by FDA under an Emergency Use Authorization (EUA). This EUA will remain  in effect  (meaning this test can be used) for the duration of the COVID-19 declaration under Section 56 4(b)(1) of the Act, 21 U.S.C. section 360bbb-3(b)(1), unless the authorization is terminated or revoked sooner. Performed at [facility], [place] [address], [place], [place] [address]   Culture, blood (routine x 2)     Status: None (Preliminary result)   Collection Time: 08/31/19 12:35 PM   Specimen: BLOOD RIGHT HAND  Result Value Ref Range Status   Specimen Description BLOOD RIGHT HAND  Final   Special Requests   Final    BOTTLES DRAWN AEROBIC ONLY Blood Culture adequate volume   Culture   Final    NO GROWTH < 24 HOURS Performed  at [facility], [place] [address] [place], [place] [address]    Report Status PENDING  Incomplete  Culture, blood (routine x 2)     Status: None (Preliminary result)   Collection Time: 08/31/19  1:23 PM   Specimen: BLOOD RIGHT HAND  Result Value Ref Range Status   Specimen Description BLOOD RIGHT HAND  Final   Special Requests   Final    BOTTLES DRAWN AEROBIC ONLY Blood Culture adequate volume   Culture   Final    NO GROWTH < 24 HOURS Performed at [facility], [place] [address], [place], [place] [address]    Report Status PENDING  Incomplete     Terri Piedra, NP [place] [facility] [phone] Pager  09/01/2019  11:05 AM

## 2019-09-02 DIAGNOSIS — B9562 Methicillin resistant Staphylococcus aureus infection as the cause of diseases classified elsewhere: Secondary | ICD-10-CM | POA: Diagnosis not present

## 2019-09-02 DIAGNOSIS — M4620 Osteomyelitis of vertebra, site unspecified: Secondary | ICD-10-CM | POA: Diagnosis not present

## 2019-09-02 DIAGNOSIS — R7881 Bacteremia: Secondary | ICD-10-CM | POA: Diagnosis not present

## 2019-09-02 DIAGNOSIS — Z886 Allergy status to analgesic agent status: Secondary | ICD-10-CM | POA: Diagnosis not present

## 2019-09-02 LAB — BASIC METABOLIC PANEL
Anion gap: 12 (ref 5–15)
BUN: 83 mg/dL — ABNORMAL HIGH (ref 8–23)
CO2: 22 mmol/L (ref 22–32)
Calcium: 8.6 mg/dL — ABNORMAL LOW (ref 8.9–10.3)
Chloride: 103 mmol/L (ref 98–111)
Creatinine, Ser: 2.54 mg/dL — ABNORMAL HIGH (ref 0.61–1.24)
GFR calc Af Amer: 29 mL/min — ABNORMAL LOW (ref >60)
GFR calc non Af Amer: 25 mL/min — ABNORMAL LOW (ref >60)
Glucose, Bld: 139 mg/dL — ABNORMAL HIGH (ref 70–99)
Potassium: 4.9 mmol/L (ref 3.5–5.1)
Sodium: 137 mmol/L (ref 135–145)

## 2019-09-02 LAB — MRSA PCR SCREENING: MRSA by PCR: POSITIVE — AB

## 2019-09-02 LAB — GLUCOSE, CAPILLARY
Glucose-Capillary: 163 mg/dL — ABNORMAL HIGH (ref 70–99)
Glucose-Capillary: 146 mg/dL — ABNORMAL HIGH (ref 70–99)
Glucose-Capillary: 146 mg/dL — ABNORMAL HIGH (ref 70–99)
Glucose-Capillary: 215 mg/dL — ABNORMAL HIGH (ref 70–99)

## 2019-09-02 LAB — HEMOGLOBIN AND HEMATOCRIT, BLOOD
HCT: 30.8 % — ABNORMAL LOW (ref 39.0–52.0)
Hemoglobin: 9.6 g/dL — ABNORMAL LOW (ref 13.0–17.0)

## 2019-09-02 MED ORDER — CHLORHEXIDINE GLUCONATE CLOTH 2 % EX PADS
6.0000 | MEDICATED_PAD | Freq: Every day | CUTANEOUS | Status: AC
  Administered 2019-09-03 – 2019-09-07 (×5): 6 via TOPICAL

## 2019-09-02 MED ORDER — TIZANIDINE HCL 2 MG PO TABS
2.0000 mg | ORAL_TABLET | Freq: Three times a day (TID) | ORAL | Status: DC | PRN
  Administered 2019-09-03 – 2019-09-26 (×27): 2 mg via ORAL
  Filled 2019-09-02 (×27): qty 1

## 2019-09-02 MED ORDER — MUPIROCIN 2 % EX OINT
1.0000 "application " | TOPICAL_OINTMENT | Freq: Two times a day (BID) | CUTANEOUS | Status: AC
  Administered 2019-09-02 – 2019-09-07 (×10): 1 via NASAL
  Filled 2019-09-02 (×4): qty 22

## 2019-09-02 MED ORDER — SODIUM CHLORIDE 0.9 % IV SOLN
1000.0000 mg | INTRAVENOUS | Status: DC
  Administered 2019-09-02 – 2019-09-21 (×20): 1000 mg via INTRAVENOUS
  Filled 2019-09-02 (×25): qty 20

## 2019-09-02 NOTE — Progress Notes (Signed)
Patient requesting his belongings to be removed to security so that a friend can pick them up. Discussed with patient that belongings had to be delivered to patient and friend will have to come to bedside to obtain belongings.   1303: wallet, two watches and pocket knife at bedside. Friend here to take belongings home.

## 2019-09-02 NOTE — Progress Notes (Signed)
PHARMACY CONSULT NOTE FOR:  OUTPATIENT  PARENTERAL ANTIBIOTIC THERAPY (OPAT)  Indication: MRSA bacteremia/discitis Regimen: daptomycin 1000 mg IV every 24 hours End date: 10/26/2019  IV antibiotic discharge orders are pended. To discharging provider:  please sign these orders via discharge navigator,  Select New Orders & click on the button choice - Manage This Unsigned Work.     Thank you for allowing pharmacy to be a part of this patient's care.  Ladoris Gene, PharmD Candidate 09/02/2019, 11:42 AM

## 2019-09-02 NOTE — Consult Note (Addendum)
WOC Nurse Consult Note: Reason for Consult: Left heel with very dry skin, adherent necrotic tissue covering site of previous ulcer believed to be pressure related. 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  Unstageable. Wound type: Pressure Pressure Injury POA: Yes Measurement: Bedside RN Primitivo Gauze to obtain measurements today and enter on to Flow sheet Wound bed: Obscured by the presence of adherent dry necrotic tissue, lifting at edges. Drainage (amount, consistency, odor) None Periwound: Dry, flaking skin Dressing procedure/placement/frequency: The cornerstone of this care plan will be to offload the heel using a Prevalon pressure redistribution heel boot which is both a treatment and a prevention strategy. Secondary goal is to moisturize the dry tissue to facilitate lifting of the dry, necrotic tissue. I have implemented a twice daily Order for washing the foot with soap and water, rinsing and drying thoroughly-particularly between toes to prevent a point of entry for fungal overgrowth-followed by application of white petrolatum (Vaseline) gauze to moisten the dry tissue (eschar vs scab). Sacrum is intact, I have requested staff place a prophylactic silicone foam dressing.   [place] nursing team will not follow, but will remain available to this patient, the nursing and medical teams.  Please re-consult if needed. Thanks, Maudie Flakes, MSN, RN, [place], Arther Abbott  Pager# [phone]

## 2019-09-02 NOTE — Progress Notes (Signed)
Upon assessment, patient has foley catheter in from previous facility. Discussed with Dr. Ree Kida and order placed to remove catheter. Patient educated significantly on importance of hygiene and q2 turns. Patient agreeable to plan of care for the day. Will continue to monitor.

## 2019-09-02 NOTE — Progress Notes (Signed)
[facility] for Infectious Disease  Date of Admission:  08/31/2019     Total days of antibiotics 4         ASSESSMENT:  Thomas [name] remains afebrile and repeat blood cultures have been without growth to date. Renal function appears to be improving slowly. If cultures remain negative today he is okay for central line placement via IR. Plan remains for 8 weeks of Daptomycin which is covered by his insurance. Will plan for 8 weeks of therapy with follow up in the ID office in 3-4 weeks via telemedicine or in person.  PLAN:  1. Continue current daptomycin until 10/26/19. 2. Monitor CK levels while on Daptomycin 3. Pain management per primary team. 4. Will follow up in the ID office in 3-4 weeks via telemedicine/in person. 5. OPAT orders placed.  Diagnosis: Vertebral osteomyelitis and MRSA bacteremia  Culture Result: MRSA  Allergies  Allergen Reactions  . Other Other (See Comments)    Blood pressure issues Per [facility] hospital: Pt states he can only take these pain meds or else he gets very sick: Oxycontin,morphine,demerol, and dilaudid are the only pain meds pt states he can take.    Marland Kitchen Oxycodone Other (See Comments)    Cases extreme changes in Blood pressure Cases extreme changes in Blood pressure   . Oxycodone-Acetaminophen Other (See Comments)    Blood pressure issues, rash from acetaminophen, pt states he can take tylenol though.  . Oxymorphone Other (See Comments)    Causes kidney problems  . Aspirin Nausea And Vomiting    Per [facility]  . Beta Vulgaris Other (See Comments)  . Buspirone Other (See Comments)  . Cabbage Other (See Comments)  . Codeine Other (See Comments)  . Fish Allergy Other (See Comments)  . Fish-Derived Products   . Gabapentin Other (See Comments)    Per [facility] papers, severe internal bleeding from colon  . Pentazocine   . Propoxyphene Other (See Comments)  . Shellfish Allergy Other (See Comments)  . Shellfish-Derived  Products   . Sulfa Antibiotics Hives  . Sulfasalazine Other (See Comments)  . Amoxicillin Rash    Per [facility]    OPAT Orders Discharge antibiotics: Daptomycin Per pharmacy protocol   Duration: 8 weeks End Date: 10/26/19  [facility] Per Protocol:  Labs weekly while on IV antibiotics: _X_ CBC with differential __ BMP _X_ CMP _X_ CRP _X_ ESR __ Vancomycin trough X__ CK  __ Please pull PIC at completion of IV antibiotics _X_ Please leave PIC in place until doctor has seen patient or been notified  Fax weekly labs to [phone]  Clinic Follow Up Appt: 09/21/2020 at 10:30 am with Thomas Piedra, NP   Principal Problem:   MRSA bacteremia Active Problems:   [name], right ([place])   [name], type 2) ([place])   HTN (hypertension)   Discitis   . amLODipine  10 mg Oral Daily  . cloNIDine  0.3 mg Oral Q8H  . heparin  5,000 Units Subcutaneous Q8H  . hydrALAZINE  25 mg Oral Q8H  . insulin aspart  0-9 Units Subcutaneous TID WC  . levothyroxine  50 mcg Oral Q0600  . metoprolol tartrate  50 mg Oral BID  . oxyCODONE  20 mg Oral Q8H    SUBJECTIVE:   Afebrile overnight with no acute events. Repeat cultures remain without growth to date.    Allergies  Allergen Reactions  . Other Other (See Comments)    Blood  pressure issues Per [facility] hospital: Pt states he can only take these pain meds or else he gets very sick: Oxycontin,morphine,demerol, and dilaudid are the only pain meds pt states he can take.    Marland Kitchen Oxycodone Other (See Comments)    Cases extreme changes in Blood pressure Cases extreme changes in Blood pressure   . Oxycodone-Acetaminophen Other (See Comments)    Blood pressure issues, rash from acetaminophen, pt states he can take tylenol though.  . Oxymorphone Other (See Comments)    Causes kidney problems  . Aspirin Nausea And Vomiting    Per [facility]  . Beta Vulgaris Other (See Comments)  . Buspirone Other (See Comments)   . Cabbage Other (See Comments)  . Codeine Other (See Comments)  . Fish Allergy Other (See Comments)  . Fish-Derived Products   . Gabapentin Other (See Comments)    Per [facility] papers, severe internal bleeding from colon  . Pentazocine   . Propoxyphene Other (See Comments)  . Shellfish Allergy Other (See Comments)  . Shellfish-Derived Products   . Sulfa Antibiotics Hives  . Sulfasalazine Other (See Comments)  . Amoxicillin Rash    Per [facility]     Review of Systems: Review of Systems  Constitutional: Negative for chills, fever and weight loss.  Respiratory: Negative for cough, shortness of breath and wheezing.   Cardiovascular: Negative for chest pain and leg swelling.  Gastrointestinal: Negative for abdominal pain, constipation, diarrhea, nausea and vomiting.  Musculoskeletal: Positive for back pain.  Skin: Negative for rash.      OBJECTIVE: Vitals:   09/01/19 2113 09/02/19 0500 09/02/19 0648 09/02/19 0815  BP: (!) 160/95  (!) 144/87 (!) 147/87  Pulse: 97  88 97  Resp:   16 16  Temp: 98.3 F (36.8 C)  98 F (36.7 C) 98.2 F (36.8 C)  TempSrc: Oral  Oral Oral  SpO2: 96%  93% 95%  Weight:  115.6 kg     Body mass index is 34.56 kg/m.  Physical Exam Constitutional:      General: He is not in acute distress.    Appearance: He is well-developed.  Cardiovascular:     Rate and Rhythm: Normal rate and regular rhythm.     Heart sounds: Normal heart sounds.  Pulmonary:     Effort: Pulmonary effort is normal.     Breath sounds: Normal breath sounds.  Skin:    General: Skin is warm and dry.  Neurological:     Mental Status: He is alert and oriented to person, place, and time.  Psychiatric:        Mood and Affect: Mood normal.     Lab Results Lab Results  Component Value Date   WBC 7.1 09/01/2019   HGB 9.6 (L) 09/02/2019   HCT 30.8 (L) 09/02/2019   MCV 96.3 09/01/2019   PLT 232 09/01/2019    Lab Results  Component Value Date    CREATININE 2.54 (H) 09/02/2019   BUN 83 (H) 09/02/2019   NA 137 09/02/2019   K 4.9 09/02/2019   CL 103 09/02/2019   CO2 22 09/02/2019    Lab Results  Component Value Date   ALT 15 08/31/2019   AST 33 08/31/2019   ALKPHOS 102 08/31/2019   BILITOT 0.7 08/31/2019     Microbiology: Recent Results (from the past 240 hour(s))  SARS CORONAVIRUS 2 (TAT 6-24 HRS) Nasopharyngeal Nasopharyngeal Swab     Status: None   Collection Time: 08/31/19  6:24 AM  Specimen: Nasopharyngeal Swab  Result Value Ref Range Status   SARS Coronavirus 2 NEGATIVE NEGATIVE Final    Comment: (NOTE) SARS-CoV-2 target nucleic acids are NOT DETECTED. The SARS-CoV-2 RNA is generally detectable in upper and lower respiratory specimens during the acute phase of infection. Negative results do not preclude SARS-CoV-2 infection, do not rule out co-infections with other pathogens, and should not be used as the sole basis for treatment or other patient management decisions. Negative results must be combined with clinical observations, patient history, and epidemiological information. The expected result is Negative. Fact Sheet for Patients: SugarRoll.be Fact Sheet for Healthcare Providers: https://www.woods-mathews.com/ This test is not yet approved or cleared by the [place] FDA and  has been authorized for detection and/or diagnosis of SARS-CoV-2 by FDA under an Emergency Use Authorization (EUA). This EUA will remain  in effect (meaning this test can be used) for the duration of the COVID-19 declaration under Section 56 4(b)(1) of the Act, 21 U.S.C. section 360bbb-3(b)(1), unless the authorization is terminated or revoked sooner. Performed at [facility], [place] [address] [place], [place] [address]   Culture, blood (routine x 2)     Status: None (Preliminary result)   Collection Time: 08/31/19 12:35 PM   Specimen: BLOOD RIGHT HAND  Result Value Ref Range  Status   Specimen Description BLOOD RIGHT HAND  Final   Special Requests   Final    BOTTLES DRAWN AEROBIC ONLY Blood Culture adequate volume   Culture   Final    NO GROWTH < 24 HOURS Performed at [facility], [place] [address], [place], [place] [address]    Report Status PENDING  Incomplete  Culture, blood (routine x 2)     Status: None (Preliminary result)   Collection Time: 08/31/19  1:23 PM   Specimen: BLOOD RIGHT HAND  Result Value Ref Range Status   Specimen Description BLOOD RIGHT HAND  Final   Special Requests   Final    BOTTLES DRAWN AEROBIC ONLY Blood Culture adequate volume   Culture   Final    NO GROWTH < 24 HOURS Performed at [facility], [place] [address] [place], [place] [address]    Report Status PENDING  Incomplete     Thomas [name], [place] for [place] Pager  09/02/2019  11:14 AM

## 2019-09-02 NOTE — Progress Notes (Signed)
Pharmacy Antibiotic Note  Thomas [name] is a 68 y.o. male admitted on 08/31/2019 with MRSA bacteremia and discitis.  Pharmacy has been consulted for daptomycin dosing. Per [facility] records, 11/6 blood cultures positive for MRSA. Patient's renal function has improved (Scr 3.1 on admission, now Scr 2.54). Est CrCl 35-40 mL/min.   Plan: Increase daptomycin dose to 1000 mg IV every 24 hours. Monitor CK weekly, renal function, and clinical status.   Weight: 254 lb 13.6 oz (115.6 kg)  Temp (24hrs), Avg:98.1 F (36.7 C), Min:97.8 F (36.6 C), Max:98.3 F (36.8 C)  Recent Labs  Lab 08/31/19 0445 09/01/19 0448 09/02/19 0224  WBC 7.1 7.1  --   CREATININE 3.10* 2.83* 2.54*    CrCl cannot be calculated (Unknown ideal weight.).    Allergies  Allergen Reactions  . Other Other (See Comments)    Blood pressure issues Per [facility] hospital: Pt states he can only take these pain meds or else he gets very sick: Oxycontin,morphine,demerol, and dilaudid are the only pain meds pt states he can take.    Marland Kitchen Oxycodone Other (See Comments)    Cases extreme changes in Blood pressure Cases extreme changes in Blood pressure   . Oxycodone-Acetaminophen Other (See Comments)    Blood pressure issues, rash from acetaminophen, pt states he can take tylenol though.  . Oxymorphone Other (See Comments)    Causes kidney problems  . Aspirin Nausea And Vomiting    Per [facility]  . Beta Vulgaris Other (See Comments)  . Buspirone Other (See Comments)  . Cabbage Other (See Comments)  . Codeine Other (See Comments)  . Fish Allergy Other (See Comments)  . Fish-Derived Products   . Gabapentin Other (See Comments)    Per [facility] papers, severe internal bleeding from colon  . Pentazocine   . Propoxyphene Other (See Comments)  . Shellfish Allergy Other (See Comments)  . Shellfish-Derived Products   . Sulfa Antibiotics Hives  . Sulfasalazine Other (See Comments)  . Amoxicillin Rash    Per  [facility]   Antimicrobials:  11/11 Daptomycin >>  Microbiology: Bcx 11/11 >> ngtd Bcx 11/6 (from [place]) >> MRSA  Thank you for allowing pharmacy to be a part of this patient's care.  Ladoris Gene, PharmD Candidate 09/02/2019 11:48 AM

## 2019-09-02 NOTE — Plan of Care (Signed)
  Problem: Health Behavior/Discharge Planning: Goal: Ability to manage health-related needs will improve Outcome: Progressing   Problem: Nutrition: Goal: Adequate nutrition will be maintained Outcome: Progressing   Problem: Coping: Goal: Level of anxiety will decrease Outcome: Progressing   Problem: Pain Managment: Goal: General experience of comfort will improve Outcome: Progressing   

## 2019-09-02 NOTE — Progress Notes (Signed)
PROGRESS NOTE    Thomas [name]  [id number] DOB: February 01, 1951 DOA: 08/31/2019 PCP: Antonietta Jewel, MD   Brief Narrative:  HPI on 08/31/2019 by Dr. Gean Birchwood Thomas [name] is a 68 y.o. male with history of diabetes mellitus type 2, hypertension, chronic kidney disease stage III, anemia who was admitted at [facility] after having fever chills and back pain.  Patient does have a history of chronic back pain which acutely worsened.  Prior to this patient has had a right below-knee amputation following which patient was found to be infected with MRSA and was supposed to be on daptomycin but per discharge summary did not receive.  Prior to this admission patient had fever for 1 day.  As per the discharge summary COVID-19 test was negative.  Work-up in the ED showed leukocytosis with normocytic anemia hyponatremia creatinine of 3.7 baseline is usually around 2.7.  Urinalysis was positive for UTI chest x-ray showed nothing acute.  Right knee x-ray showed some irregularity suspected to be an infection.  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  Patient was placed on IV daptomycin.  Pain was worsening.  2D echo did not show anything acute.  Patient will need MRI with sedation and TEE for which patient was transferred to [facility].  On my exam patient still has back pain radiating to both legs which increased on movement of both legs.  Has had mild chest discomfort by the time I examined.  EKG troponin chest x-ray has been ordered all of which are pending.  Prior to transfer patient has had Foley catheter placement due to urinary retention.  Interim history Patient transferred from [facility] for MRSA bacteremia with possible need of TEE and MRI.  Discussed with infectious disease, Dr. Linus Salmons, TEE and MRI not needed at this time.  Recommended repeat TTE in 4 to [redacted] weeks along with IV antibiotics for approximately 8 weeks.  Currently pending blood  cultures. Assessment & Plan   MRSA bacteremia/discitis -CT abdomen showing discitis involvling T10, T11 area  -Currently on daptomycin -Infectious disease consulted and associated - Discussed with Dr. Linus Salmons- no need for MRI as CT showed discitis. Patient had a TTE which was not concerning for vegetation.  Recommended repeat TTE in 4 to 6 weeks.  Recommended IV antibiotics of daptomycin for 8 weeks.  Patient will need a PICC line when blood cultures are negative for 48 hours.  Monitor CK levels. 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, stage IV -Resolved -Baseline creatinine approximately 2.7, currently 3.1 -Creatinine improved, down to 2.51 today -Continue to monitor BMP  Anemia of chronic disease -baseline Hemoglobin approximately 9 -Stable, hemoglobin currently 9.6  Back pain -continue OxyContin and IV morphine as needed for now -patient complains of back spasms -Was placed on robaxin, however states that this does not work for him and requesting zanaflex -will place on zanaflex 2mg  TID prn  Hiccups -appear to have resolved -was given one dose of valium   History of right below-knee amputation  DVT Prophylaxis  Heparin  Code Status: Full  Family Communication: None at baseline  Disposition Plan: Admitted, continue IV antibiotics. Pending blood culture results. Dispo likely SNF in the next few days  Consultants Infectious disease  Procedures  None  Antibiotics  Anti-infectives (From admission, onward)   Start     Dose/Rate Route Frequency Ordered Stop   09/02/19 2000  DAPTOmycin (CUBICIN) 1,000 mg in sodium chloride 0.9 % IVPB     1,000 mg 240 mL/hr over 30 Minutes Intravenous Every 48 hours 08/31/19 1205      08/31/19 1000  DAPTOmycin (CUBICIN) 750 mg in sodium chloride 0.9 % IVPB  Status:  Discontinued     750 mg 230 mL/hr over 30 Minutes Intravenous Every 48 hours 08/31/19 0414 08/31/19 1205      Subjective:   Thomas [name] seen and examined today.  Continues to complain of pain all over especially in his back.  States that he does not want to move until his back pain is controlled.  Does not feel that Robaxin helps him.  Requesting Zanaflex.  Denies current chest pain or shortness of breath, abdominal pain, nausea or vomiting. Objective:   Vitals:   09/01/19 2113 09/02/19 0500 09/02/19 0648 09/02/19 0815  BP: (!) 160/95  (!) 144/87 (!) 147/87  Pulse: 97  88 97  Resp:   16 16  Temp: 98.3 F (36.8 C)  98 F (36.7 C) 98.2 F (36.8 C)  TempSrc: Oral  Oral Oral  SpO2: 96%  93% 95%  Weight:  115.6 kg      Intake/Output Summary (Last 24 hours) at 09/02/2019 1001 Last data filed at 09/02/2019 0900 Gross per 24 hour  Intake 790 ml  Output 3000 ml  Net -2210 ml   Filed Weights   08/31/19 0352 09/01/19 0700 09/02/19 0500  Weight: 119.3 kg 117 kg 115.6 kg   Exam  General: Well developed, chronically ill appearing, NAD  HEENT: NCAT, mucous membranes moist.   Cardiovascular: S1 S2 auscultated, RRR, no murmur  Respiratory: Clear to auscultation bilaterally with equal chest rise  Abdomen: Soft, nontender, nondistended, + bowel sounds  Extremities: warm dry without cyanosis clubbing or edema of LLE, R BKA  Neuro: AAOx3, nonfocal  Psych: Appropriate mood and affect  Data Reviewed: I have personally reviewed following labs and imaging studies  CBC: Recent Labs  Lab 08/31/19 0445 09/01/19 0448 09/02/19 0224  WBC 7.1 7.1  --   NEUTROABS 5.2  --   --   HGB 9.4* 9.6* 9.6*  HCT 30.1* 31.6* 30.8*  MCV 95.6 96.3  --   PLT 216 232  --    Basic Metabolic Panel: Recent Labs  Lab 08/31/19 0445 09/01/19 0448 09/02/19 0224  NA 139 138 137  K 4.3 4.9 4.9  CL 103 101 103   CO2 22 24 22   GLUCOSE 169* 129* 139*  BUN 91* 86* 83*  CREATININE 3.10* 2.83* 2.54*  CALCIUM 8.7* 8.7* 8.6*   GFR: CrCl cannot be calculated (Unknown ideal weight.). Liver Function Tests: Recent Labs  Lab 08/31/19 0445  AST 33  ALT 15  ALKPHOS 102  BILITOT 0.7  PROT 7.5  ALBUMIN 2.4*   No results for input(s): LIPASE, AMYLASE in the last 168 hours. No results for input(s): AMMONIA in the last 168 hours. Coagulation Profile: No results for input(s): INR, PROTIME in the last 168 hours. Cardiac Enzymes: Recent Labs  Lab 09/01/19 0448  CKTOTAL 187   BNP (last 3 results) No results for input(s): PROBNP in the last 8760 hours. HbA1C: No results for input(s): HGBA1C in the last 72 hours. CBG: Recent Labs  Lab 09/01/19 0805 09/01/19 1151 09/01/19 1739 09/01/19 2144 09/02/19 0803  GLUCAP 139* 160* 150* 170* 163*   Lipid  Profile: No results for input(s): CHOL, HDL, LDLCALC, TRIG, CHOLHDL, LDLDIRECT in the last 72 hours. Thyroid Function Tests: No results for input(s): TSH, T4TOTAL, FREET4, T3FREE, THYROIDAB in the last 72 hours. Anemia Panel: No results for input(s): VITAMINB12, FOLATE, FERRITIN, TIBC, IRON, RETICCTPCT in the last 72 hours. Urine analysis: No results found for: COLORURINE, APPEARANCEUR, LABSPEC, PHURINE, GLUCOSEU, HGBUR, BILIRUBINUR, KETONESUR, PROTEINUR, UROBILINOGEN, NITRITE, LEUKOCYTESUR Sepsis Labs: @LABRCNTIP (procalcitonin:4,lacticidven:4)  ) Recent Results (from the past 240 hour(s))  SARS CORONAVIRUS 2 (TAT 6-24 HRS) Nasopharyngeal Nasopharyngeal Swab     Status: None   Collection Time: 08/31/19  6:24 AM   Specimen: Nasopharyngeal Swab  Result Value Ref Range Status   SARS Coronavirus 2 NEGATIVE NEGATIVE Final    Comment: (NOTE) SARS-CoV-2 target nucleic acids are NOT DETECTED. The SARS-CoV-2 RNA is generally detectable in upper and lower respiratory specimens during the acute phase of infection. Negative results do not preclude  SARS-CoV-2 infection, do not rule out co-infections with other pathogens, and should not be used as the sole basis for treatment or other patient management decisions. Negative results must be combined with clinical observations, patient history, and epidemiological information. The expected result is Negative. Fact Sheet for Patients: SugarRoll.be Fact Sheet for Healthcare Providers: https://www.woods-mathews.com/ This test is not yet approved or cleared by the [place] FDA and  has been authorized for detection and/or diagnosis of SARS-CoV-2 by FDA under an Emergency Use Authorization (EUA). This EUA will remain  in effect (meaning this test can be used) for the duration of the COVID-19 declaration under Section 56 4(b)(1) of the Act, 21 U.S.C. section 360bbb-3(b)(1), unless the authorization is terminated or revoked sooner. Performed at [facility], [place] [address] [place], [place] [address]   Culture, blood (routine x 2)     Status: None (Preliminary result)   Collection Time: 08/31/19 12:35 PM   Specimen: BLOOD RIGHT HAND  Result Value Ref Range Status   Specimen Description BLOOD RIGHT HAND  Final   Special Requests   Final    BOTTLES DRAWN AEROBIC ONLY Blood Culture adequate volume   Culture   Final    NO GROWTH < 24 HOURS Performed at [facility], [place] [address] [place], [place] [address]    Report Status PENDING  Incomplete  Culture, blood (routine x 2)     Status: None (Preliminary result)   Collection Time: 08/31/19  1:23 PM   Specimen: BLOOD RIGHT HAND  Result Value Ref Range Status   Specimen Description BLOOD RIGHT HAND  Final   Special Requests   Final    BOTTLES DRAWN AEROBIC ONLY Blood Culture adequate volume   Culture   Final    NO GROWTH < 24 HOURS Performed at [facility], [place] [address], [place], [place] [address]    Report Status PENDING  Incomplete      Radiology Studies: Dg  Chest Port 1 View  Result Date: 08/31/2019 CLINICAL DATA:  Chest pain and shortness of breath EXAM: PORTABLE CHEST 1 VIEW COMPARISON:  08/26/2019 FINDINGS: Cardiac shadow is enlarged but stable. Postsurgical changes are again seen. The lungs are well aerated bilaterally. No focal infiltrate or sizable effusion is seen. Mild persistent left basilar atelectasis is noted. IMPRESSION: Stable left basilar atelectasis. No other focal abnormality is seen. Electronically Signed   By: Inez Catalina M.D.   On: 08/31/2019 20:24     Scheduled Meds: . amLODipine  10 mg Oral Daily  . cloNIDine  0.3  mg Oral Q8H  . heparin  5,000 Units Subcutaneous Q8H  . hydrALAZINE  25 mg Oral Q8H  . insulin aspart  0-9 Units Subcutaneous TID WC  . levothyroxine  50 mcg Oral Q0600  . metoprolol tartrate  50 mg Oral BID  . oxyCODONE  20 mg Oral Q8H   Continuous Infusions: . DAPTOmycin (CUBICIN)  IV       LOS: 2 days   Time Spent in minutes   30 minutes  Joas Motton D.O. on 09/02/2019 at 10:01 AM  Between 7am to 7pm - Please see pager noted on [email or web address]  After 7pm go to [email or web address]  And look for the night coverage person covering for me after hours  [facility] Office  [phone]

## 2019-09-02 NOTE — Progress Notes (Signed)
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 Patient transferred from [facility] for MRSA bacteremia. CT abdomen showing discitis involvling T10, T11 area.    PT Comments    Pt performed limited session but did progress more from previous session.  He pain seems to be centralized to his pevic and not his back.  He performed limited LE strengthening, partial roll to the R and pulling up to [facility].  He continues to lack ability to mobilize to edge of bed due to pain and discomfort.  Based on presentation he continues to benefit from SNF placement at d/c.      Follow Up Recommendations  SNF     Equipment Recommendations  (defer to post acute rehab)    Recommendations for Other Services       Precautions / Restrictions Precautions Precautions: Fall Precaution Comments: Contact precautions MRSA, scab on incision of residual limb Restrictions Weight Bearing Restrictions: No    Mobility  Bed Mobility Overal bed mobility: Needs Assistance Bed Mobility: Rolling Rolling: Min assist         General bed mobility comments: Pt performed rolling part of the way to the R but did not complete roll due to pain in pelvis.  He was able to boost unassisted to [facility] in trended bed.  Pt reached over hand with arms to pull.  Transfers Overall transfer level: (NT)                  Ambulation/Gait                 Stairs             Wheelchair Mobility    Modified Rankin (Stroke Patients Only)       Balance                                            Cognition Arousal/Alertness: Awake/alert Behavior During Therapy: WFL  for tasks assessed/performed Overall Cognitive Status: Within Functional Limits for tasks assessed                                 General Comments: Tangential      Exercises General Exercises - Lower Extremity Quad Sets: AROM;Both;5 reps;Supine Heel Slides: AROM;Left;10 reps;Supine Amputee Exercises Knee Flexion: AROM;Right;10 reps;Supine    General Comments        Pertinent Vitals/Pain Pain Assessment: 0-10 Pain Score: 8  Pain Descriptors / Indicators: Grimacing;Heaviness;Moaning Pain Intervention(s): Monitored during session;Repositioned;Patient requesting pain meds-RN notified;RN gave pain meds during session    Home Living                      Prior Function            PT Goals (current goals can now be found in the care plan section) Acute Rehab PT Goals Patient Stated Goal: to get some rest, less pain Potential to Achieve Goals: Fair Progress towards PT goals: Progressing toward goals    Frequency  Min 3X/week      PT Plan Current plan remains appropriate    Co-evaluation              AM-PAC PT "6 Clicks" Mobility   Outcome Measure  Help needed turning from your back to your side while in a flat bed without using bedrails?: A Lot Help needed moving from lying on your back to sitting on the side of a flat bed without using bedrails?: A Lot Help needed moving to and from a bed to a chair (including a wheelchair)?: A Lot Help needed standing up from a chair using your arms (e.g., wheelchair or bedside chair)?: A Lot Help needed to walk in hospital room?: Total Help needed climbing 3-5 steps with a railing? : Total 6 Click Score: 10    End of Session Equipment Utilized During Treatment: Gait belt Activity Tolerance: Patient limited by pain Patient left: in bed;with call bell/phone within reach;with bed alarm set Nurse Communication: Mobility status PT Visit Diagnosis: Difficulty in walking, not elsewhere classified  (R26.2);Muscle weakness (generalized) (M62.81);Unsteadiness on feet (R26.81)     Time: [id number] PT Time Calculation (min) (ACUTE ONLY): 27 min  Charges:  $Therapeutic Exercise: 8-22 mins $Therapeutic Activity: 8-22 mins                     Thomas [name] , PTA Acute Rehabilitation Services Pager [phone] Office [phone]     Thomas [name] Thomas [name] 09/02/2019, 5:14 PM

## 2019-09-03 ENCOUNTER — Inpatient Hospital Stay: Payer: Self-pay

## 2019-09-03 LAB — GLUCOSE, CAPILLARY
Glucose-Capillary: 201 mg/dL — ABNORMAL HIGH (ref 70–99)
Glucose-Capillary: 190 mg/dL — ABNORMAL HIGH (ref 70–99)
Glucose-Capillary: 196 mg/dL — ABNORMAL HIGH (ref 70–99)
Glucose-Capillary: 214 mg/dL — ABNORMAL HIGH (ref 70–99)

## 2019-09-03 MED ORDER — PREDNISONE 20 MG PO TABS
40.0000 mg | ORAL_TABLET | Freq: Every day | ORAL | Status: DC
  Administered 2019-09-03 – 2019-09-06 (×4): 40 mg via ORAL
  Filled 2019-09-03 (×5): qty 2

## 2019-09-03 NOTE — Progress Notes (Signed)
PROGRESS NOTE    [name]  [id number] DOB: 04/04/1951 DOA: 08/31/2019 PCP: Antonietta Jewel, MD   Brief Narrative:  HPI on 08/31/2019 by Dr. Gean [name] [name] is a 68 y.o. male with history of diabetes mellitus type 2, hypertension, chronic kidney disease stage III, anemia who was admitted at [name] after having fever chills and back pain.  Patient does have a history of chronic back pain which acutely worsened.  Prior to this patient has had a right below-knee amputation following which patient was found to be infected with MRSA and was supposed to be on daptomycin but per discharge summary did not receive.  Prior to this admission patient had fever for 1 day.  As per the discharge summary COVID-19 test was negative.  Work-up in the ED showed leukocytosis with normocytic anemia hyponatremia creatinine of 3.7 baseline is usually around 2.7.  Urinalysis was positive for UTI chest x-ray showed nothing acute.  Right knee x-ray showed some irregularity suspected to be an infection.  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  Patient was placed on IV daptomycin.  Pain was worsening.  2D echo did not show anything acute.  Patient will need MRI with sedation and TEE for which patient was transferred to [facility].  On my exam patient still has back pain radiating to both legs which increased on movement of both legs.  Has had mild chest discomfort by the time I examined.  EKG troponin chest x-ray has been ordered all of which are pending.  Prior to transfer patient has had Foley catheter placement due to urinary retention.  Interim history Patient transferred from [name] for MRSA bacteremia with possible need of TEE and MRI.  Discussed with infectious disease, Dr. Linus Salmons, TEE and MRI not needed at this time.  Recommended repeat TTE in 4 to [redacted] weeks along with IV antibiotics for approximately 8 weeks.  Currently pending blood  cultures. Assessment & Plan   MRSA bacteremia/discitis -CT abdomen showing discitis involvling T10, T11 area  -Currently on daptomycin -Infectious disease consulted and associated - Discussed with Dr. Linus Salmons- no need for MRI as CT showed discitis. Patient had a TTE which was not concerning for vegetation.  Recommended repeat TTE in 4 to 6 weeks.  Recommended IV antibiotics of daptomycin for 8 weeks.  Patient will need a PICC line when blood cultures are negative for 48 hours.  Monitor CK levels. 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, stage IV -Resolved -Baseline creatinine approximately 2.7, currently 3.1 -Creatinine improved, down to 2.5 -Continue to monitor BMP  Anemia of chronic disease -baseline Hemoglobin approximately 9 -Stable, hemoglobin currently 9.6  Back pain -continue OxyContin and IV morphine as needed for now -patient complains of back spasms -Was placed on robaxin, however states that this does not work for him and requesting zanaflex -Continue zanaflex 2mg  TID prn  Hiccups -appear to have resolved -was given one dose of valium   History of right below-knee amputation  DVT Prophylaxis  Heparin  Code Status: Full  Family Communication: None at baseline  Disposition Plan: Admitted, continue IV antibiotics. Needs PICC. Dispo likely SNF in the next few days  Consultants Infectious disease  Procedures  None  Antibiotics   Anti-infectives (From  admission, onward)   Start     Dose/Rate Route Frequency Ordered Stop   09/02/19 2000  DAPTOmycin (CUBICIN) 1,000 mg in sodium chloride 0.9 % IVPB  Status:  Discontinued     1,000 mg 240 mL/hr over 30 Minutes Intravenous Every 48 hours 08/31/19 1205  09/02/19 1206   09/02/19 2000  DAPTOmycin (CUBICIN) 1,000 mg in sodium chloride 0.9 % IVPB     1,000 mg 240 mL/hr over 30 Minutes Intravenous Every 24 hours 09/02/19 1206     08/31/19 1000  DAPTOmycin (CUBICIN) 750 mg in sodium chloride 0.9 % IVPB  Status:  Discontinued     750 mg 230 mL/hr over 30 Minutes Intravenous Every 48 hours 08/31/19 0414 08/31/19 1205      Subjective:   [name] seen and examined today.  Continues to have pain all over and states he is ready for his pain medication.  Denies current chest pain, shortness of breath, abdominal pain, nausea vomiting, diarrhea or constipation. Objective:   Vitals:   09/03/19 0441 09/03/19 0519 09/03/19 0520 09/03/19 0803  BP:  (!) 151/94  134/89  Pulse:  86  88  Resp: 15 18 18    Temp:  99.4 F (37.4 C)    TempSrc:  Oral    SpO2:  95% 95%   Weight: 114.6 kg       Intake/Output Summary (Last 24 hours) at 09/03/2019 0855 Last data filed at 09/03/2019 0537 Gross per 24 hour  Intake 620 ml  Output 1875 ml  Net -1255 ml   Filed Weights   09/01/19 0700 09/02/19 0500 09/03/19 0441  Weight: 117 kg 115.6 kg 114.6 kg   Exam  General: Well developed, chronically ill appearing, NAD, appears stated age  53: NCAT, mucous membranes moist.   Cardiovascular: S1 S2 auscultated, RRR  Respiratory: Clear to auscultation bilaterally with equal chest rise  Abdomen: Soft, nontender, nondistended, + bowel sounds  Extremities: warm dry without cyanosis clubbing or edema of LLE, R BKA  Neuro: AAOx3, nonfocal  Psych: appropriate  Data Reviewed: I have personally reviewed following labs and imaging studies  CBC: Recent Labs  Lab 08/31/19 0445 09/01/19 0448 09/02/19 0224  WBC 7.1 7.1  --   NEUTROABS 5.2  --   --   HGB 9.4* 9.6* 9.6*  HCT 30.1* 31.6* 30.8*  MCV 95.6 96.3  --   PLT 216 232  --    Basic Metabolic Panel: Recent Labs  Lab 08/31/19 0445 09/01/19 0448 09/02/19 0224  NA 139 138 137  K 4.3 4.9 4.9    CL 103 101 103  CO2 22 24 22   GLUCOSE 169* 129* 139*  BUN 91* 86* 83*  CREATININE 3.10* 2.83* 2.54*  CALCIUM 8.7* 8.7* 8.6*   GFR: CrCl cannot be calculated (Unknown ideal weight.). Liver Function Tests: Recent Labs  Lab 08/31/19 0445  AST 33  ALT 15  ALKPHOS 102  BILITOT 0.7  PROT 7.5  ALBUMIN 2.4*   No results for input(s): LIPASE, AMYLASE in the last 168 hours. No results for input(s): AMMONIA in the last 168 hours. Coagulation Profile: No results for input(s): INR, PROTIME in the last 168 hours. Cardiac Enzymes: Recent Labs  Lab 09/01/19 0448  CKTOTAL 187   BNP (last 3 results) No results for input(s): PROBNP in the last 8760 hours. HbA1C: No results for input(s): HGBA1C in the last 72 hours. CBG: Recent Labs  Lab 09/02/19 0803 09/02/19 1158 09/02/19 1657 09/02/19 2220 09/03/19 0801  GLUCAP 163* 146*  146* 215* 201*   Lipid Profile: No results for input(s): CHOL, HDL, LDLCALC, TRIG, CHOLHDL, LDLDIRECT in the last 72 hours. Thyroid Function Tests: No results for input(s): TSH, T4TOTAL, FREET4, T3FREE, THYROIDAB in the last 72 hours. Anemia Panel: No results for input(s): VITAMINB12, FOLATE, FERRITIN, TIBC, IRON, RETICCTPCT in the last 72 hours. Urine analysis: No results found for: COLORURINE, APPEARANCEUR, LABSPEC, PHURINE, GLUCOSEU, HGBUR, BILIRUBINUR, KETONESUR, PROTEINUR, UROBILINOGEN, NITRITE, LEUKOCYTESUR Sepsis Labs: @LABRCNTIP (procalcitonin:4,lacticidven:4)  ) Recent Results (from the past 240 hour(s))  SARS CORONAVIRUS 2 (TAT 6-24 HRS) Nasopharyngeal Nasopharyngeal Swab     Status: None   Collection Time: 08/31/19  6:24 AM   Specimen: Nasopharyngeal Swab  Result Value Ref Range Status   SARS Coronavirus 2 NEGATIVE NEGATIVE Final    Comment: (NOTE) SARS-CoV-2 target nucleic acids are NOT DETECTED. The SARS-CoV-2 RNA is generally detectable in upper and lower respiratory specimens during the acute phase of infection. Negative results do not  preclude SARS-CoV-2 infection, do not rule out co-infections with other pathogens, and should not be used as the sole basis for treatment or other patient management decisions. Negative results must be combined with clinical observations, patient history, and epidemiological information. The expected result is Negative. Fact Sheet for Patients: SugarRoll.be Fact Sheet for Healthcare Providers: https://www.woods-mathews.com/ This test is not yet approved or cleared by the [place] FDA and  has been authorized for detection and/or diagnosis of SARS-CoV-2 by FDA under an Emergency Use Authorization (EUA). This EUA will remain  in effect (meaning this test can be used) for the duration of the COVID-19 declaration under Section 56 4(b)(1) of the Act, 21 U.S.C. section 360bbb-3(b)(1), unless the authorization is terminated or revoked sooner. Performed at [name], [place] [address], [place], [place] [address]   Culture, blood (routine x 2)     Status: None (Preliminary result)   Collection Time: 08/31/19 12:35 PM   Specimen: BLOOD RIGHT HAND  Result Value Ref Range Status   Specimen Description BLOOD RIGHT HAND  Final   Special Requests   Final    BOTTLES DRAWN AEROBIC ONLY Blood Culture adequate volume   Culture   Final    NO GROWTH 2 DAYS Performed at [name], [place] [address], [place], [place] [address]    Report Status PENDING  Incomplete  Culture, blood (routine x 2)     Status: None (Preliminary result)   Collection Time: 08/31/19  1:23 PM   Specimen: BLOOD RIGHT HAND  Result Value Ref Range Status   Specimen Description BLOOD RIGHT HAND  Final   Special Requests   Final    BOTTLES DRAWN AEROBIC ONLY Blood Culture adequate volume   Culture   Final    NO GROWTH 2 DAYS Performed at [name], [place] [address], [place], [place] [address]    Report Status PENDING  Incomplete  MRSA PCR Screening     Status:  Abnormal   Collection Time: 09/02/19  1:31 PM   Specimen: Nasal Mucosa; Nasopharyngeal  Result Value Ref Range Status   MRSA by PCR POSITIVE (A) NEGATIVE Final    Comment:        The GeneXpert MRSA Assay (FDA approved for NASAL specimens only), is one component of a comprehensive MRSA colonization surveillance program. It is not intended to diagnose MRSA infection nor to guide or monitor treatment for MRSA infections. RESULT CALLED TO, READ BACK BY AND VERIFIED WITH: RN Marguerita Beards [id number] Performed at [facility]  [name], [place] [address], [place], [place] [address]       Radiology Studies: [place] Ekg Site Rite  Result Date: 09/03/2019 If [name] image not attached, placement could not be confirmed due to current cardiac rhythm.    Scheduled Meds:  amLODipine  10 mg Oral Daily   Chlorhexidine Gluconate Cloth  6 each Topical Q0600   cloNIDine  0.3 mg Oral Q8H   heparin  5,000 Units Subcutaneous Q8H   hydrALAZINE  25 mg Oral Q8H   insulin aspart  0-9 Units Subcutaneous TID WC   levothyroxine  50 mcg Oral Q0600   metoprolol tartrate  50 mg Oral BID   mupirocin ointment  1 application Nasal BID   oxyCODONE  20 mg Oral Q8H   predniSONE  40 mg Oral Q breakfast   Continuous Infusions:  DAPTOmycin (CUBICIN)  IV 1,000 mg (09/02/19 2113)     LOS: 3 days   Time Spent in minutes   30 minutes  Salathiel Ferrara D.O. on 09/03/2019 at 8:55 AM  Between 7am to 7pm - Please see pager noted on [email or web address]  After 7pm go to [email or web address]  And look for the night coverage person covering for me after hours  [facility] Office  [phone]

## 2019-09-03 NOTE — TOC Initial Note (Signed)
Transition of Care [facility]) - Initial/Assessment Note    Patient Details  Name: [name] MRN: [id number] Date of Birth: 1951/04/15  [facility]) CM/SW Contact:    Alberteen Sam, [place] Phone Number: [phone] 09/03/2019, 12:04 PM  Clinical Narrative:                  CSW spoke with patient regarding discharge planning and SNF recommendation by PT. Patient reports he has been to SNFs in the past with poor experiences and is going home at discharge. CSW inquired as to support at home, he reports he lives by himself. CSW reiterated that it is recommended patient go to SNF for additional assistance, he reports he is independent and will not go to SNF. CSW inquired as to any home health agencies used in the past, patient would not provide names of agencies however stated that [place] agencies will not go into his home. CSW inquired as to why, patient answered that CSW can try to set him up with home health but that they refuse to enter his home. CSW unclear as to reasoning at this time and patient guarded with responses.   CSW will send out referrals to home health agencies and inform patient of acceptances.   Patient identifies no equipment needs as he reports a walker will not work for his home set up and that he uses a crutch and a cane.   Expected Discharge Plan: [place] Barriers to Discharge: Continued Medical Work up   Patient Goals and CMS Choice Patient states their goals for this hospitalization and ongoing recovery are:: to go home CMS Medicare.gov Compare Post Acute Care list provided to:: Patient Choice offered to / list presented to : Patient  Expected Discharge Plan and Services Expected Discharge Plan: [place] Choice: [place] arrangements for the past 2 months: Single Family Home                                      Prior Living Arrangements/Services Living arrangements  for the past 2 months: Single Family Home Lives with:: Self Patient language and need for interpreter reviewed:: Yes Do you feel safe going back to the place where you live?: Yes      Need for Family Participation in Patient Care: No (Comment) Care giver support system in place?: No (comment)   Criminal Activity/Legal Involvement Pertinent to Current Situation/Hospitalization: No - Comment as needed  Activities of Daily Living      Permission Sought/Granted Permission sought to share information with : Case Manager, Customer service manager       Permission granted to share info w AGENCY: Home Health Agencies        Emotional Assessment Appearance:: Appears stated age Attitude/Demeanor/Rapport: Avoidant, Guarded Affect (typically observed): Defensive Orientation: : Oriented to Self, Oriented to Place, Oriented to  Time, Oriented to Situation Alcohol / Substance Use: Not Applicable Psych Involvement: No (comment)  Admission diagnosis:  SEPSIS Patient Active Problem List   Diagnosis Date Noted  . MRSA bacteremia 08/31/2019  . Discitis 08/31/2019  . Wound infection after surgery 09/29/2018  . Pulmonary emboli ([place]) 09/29/2018  . [name], right ([place]) 09/29/2018  . [name], type 2) ([place]) 09/29/2018  . Gout 09/29/2018  . HTN (hypertension) 09/29/2018  . CHF (  congestive heart failure), NYHA class I, chronic, diastolic ([place]) [id number]  . GERD (gastroesophageal reflux disease) 09/29/2018   PCP:  Antonietta Jewel, MD Pharmacy:   [place], [place] - [place] [address] Phone: [phone] Fax: [phone]     Social Determinants of Health (SDOH) Interventions    Readmission Risk Interventions No flowsheet data found.

## 2019-09-03 NOTE — Progress Notes (Signed)
Spoke with RN re PICC order.  Per ID notes, PICC to be placed in IR.  RN states due to hx of CKD.  RN to notify MD to have order changed to IR placement.

## 2019-09-03 NOTE — Progress Notes (Signed)
IR.  Plan for image-guided tunneled CVC placement in IR on Monday 09/05/2019, no sedation. Patient has been seen/consented for procedure. INR pending.  Please call IR with questions/concerns.  Bea Graff Louk, PA-C 09/03/2019, 12:48 PM

## 2019-09-04 LAB — GLUCOSE, CAPILLARY
Glucose-Capillary: 148 mg/dL — ABNORMAL HIGH (ref 70–99)
Glucose-Capillary: 240 mg/dL — ABNORMAL HIGH (ref 70–99)
Glucose-Capillary: 265 mg/dL — ABNORMAL HIGH (ref 70–99)
Glucose-Capillary: 240 mg/dL — ABNORMAL HIGH (ref 70–99)

## 2019-09-04 LAB — PROTIME-INR
INR: 1.2 (ref 0.8–1.2)
Prothrombin Time: 14.6 seconds (ref 11.4–15.2)

## 2019-09-04 LAB — CK: Total CK: 31 U/L — ABNORMAL LOW (ref 49–397)

## 2019-09-04 NOTE — Progress Notes (Signed)
PROGRESS NOTE    Thomas [name]  [id number] DOB: 10-02-1951 DOA: 08/31/2019 PCP: Antonietta Jewel, MD   Brief Narrative:  HPI on 08/31/2019 by Dr. Gean Birchwood Thomas [name] is a 68 y.o. male with history of diabetes mellitus type 2, hypertension, chronic kidney disease stage III, anemia who was admitted at [facility] after having fever chills and back pain.  Patient does have a history of chronic back pain which acutely worsened.  Prior to this patient has had a right below-knee amputation following which patient was found to be infected with MRSA and was supposed to be on daptomycin but per discharge summary did not receive.  Prior to this admission patient had fever for 1 day.  As per the discharge summary COVID-19 test was negative.  Work-up in the ED showed leukocytosis with normocytic anemia hyponatremia creatinine of 3.7 baseline is usually around 2.7.  Urinalysis was positive for UTI chest x-ray showed nothing acute.  Right knee x-ray showed some irregularity suspected to be an infection.  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  Patient was placed on IV daptomycin.  Pain was worsening.  2D echo did not show anything acute.  Patient will need MRI with sedation and TEE for which patient was transferred to [facility].  On my exam patient still has back pain radiating to both legs which increased on movement of both legs.  Has had mild chest discomfort by the time I examined.  EKG troponin chest x-ray has been ordered all of which are pending.  Prior to transfer patient has had Foley catheter placement due to urinary retention.  Interim history Patient transferred from [facility] for MRSA bacteremia with possible need of TEE and MRI.  Discussed with infectious disease, Dr. Linus Salmons, TEE and MRI not needed at this time.  Recommended repeat TTE in 4 to [redacted] weeks along with IV antibiotics for approximately 8 weeks.  Currently pending tunneled  CVC placement.  Assessment & Plan   MRSA bacteremia/discitis -CT abdomen showing discitis involvling T10, T11 area  -Currently on daptomycin -Infectious disease consulted and associated - Discussed with Dr. Linus Salmons- no need for MRI as CT showed discitis. Patient had a TTE which was not concerning for vegetation.  Recommended repeat TTE in 4 to 6 weeks.  Recommended IV antibiotics of daptomycin for 8 weeks.  Patient will need a PICC line when blood cultures are negative for 48 hours.  Monitor CK levels. 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, stage IV -Resolved -Baseline creatinine approximately 2.7, currently 3.1 -Creatinine improved, down to 2.5 -Continue to monitor BMP  Anemia of chronic disease -baseline Hemoglobin approximately 9 -Stable, hemoglobin currently 9.6  Back pain -continue OxyContin and IV morphine as needed for now -patient complains of back spasms -Was placed on robaxin, however states that this does not work for him and requesting zanaflex -Continue zanaflex 2mg  TID prn  Hiccups -appear to have resolved -was given one dose of valium   History of right below-knee amputation  DVT Prophylaxis  Heparin  Code Status: Full  Family Communication: None at baseline  Disposition Plan: Admitted, continue IV antibiotics. Needs PICC. Dispo likely SNF in the next few days  Consultants  Infectious disease Interventional radiology   Procedures  None  Antibiotics   Anti-infectives (From admission, onward)   Start     Dose/Rate Route Frequency Ordered Stop   09/02/19 2000  DAPTOmycin (CUBICIN) 1,000 mg in sodium chloride 0.9 % IVPB   Status:  Discontinued     1,000 mg 240 mL/hr over 30 Minutes Intravenous Every 48 hours 08/31/19 1205 09/02/19 1206   09/02/19 2000  DAPTOmycin (CUBICIN) 1,000 mg in sodium chloride 0.9 % IVPB     1,000 mg 240 mL/hr over 30 Minutes Intravenous Every 24 hours 09/02/19 1206     08/31/19 1000  DAPTOmycin (CUBICIN) 750 mg in sodium chloride 0.9 % IVPB  Status:  Discontinued     750 mg 230 mL/hr over 30 Minutes Intravenous Every 48 hours 08/31/19 0414 08/31/19 1205      Subjective:   Thomas [name] seen and examined today.  Patient continues to complain of pain particularly in his back.  Denies current chest pain, shortness of breath, abdominal pain, nausea or vomiting, diarrhea or constipation. Objective:   Vitals:   09/03/19 2136 09/04/19 0606 09/04/19 0640 09/04/19 0847  BP: (!) 158/94 (!) 161/94 (!) 161/91 (!) 162/94  Pulse: 98  72 77  Resp:    12  Temp: 97.9 F (36.6 C)  97.8 F (36.6 C)   TempSrc: Oral  Oral   SpO2: 96%  97%   Weight:        Intake/Output Summary (Last 24 hours) at 09/04/2019 0951 Last data filed at 09/04/2019 0900 Gross per 24 hour  Intake 520 ml  Output 1050 ml  Net -530 ml   Filed Weights   09/01/19 0700 09/02/19 0500 09/03/19 0441  Weight: 117 kg 115.6 kg 114.6 kg   Exam  General: Well developed, Ackley ill-appearing, NAD  HEENT: NCAT, PERRLA, EOMI, Anicteic Sclera, mucous membranes moist.   Neck: Supple, no JVD, no masses  Cardiovascular: S1 S2 auscultated, RRR, no murmur appreciated respiratory: Clear to auscultation bilaterally with equal chest rise  Abdomen: Soft, nontender, nondistended, + bowel sounds  Extremities: warm dry without cyanosis clubbing or edema of LLE.  R BKA  Neuro: AAOx3, nonfocal  Psych: Appropriate mood and affect  Data Reviewed: I have personally reviewed following labs and imaging studies  CBC: Recent Labs  Lab 08/31/19 0445 09/01/19 0448 09/02/19 0224  WBC 7.1 7.1  --   NEUTROABS 5.2  --   --   HGB  9.4* 9.6* 9.6*  HCT 30.1* 31.6* 30.8*  MCV 95.6 96.3  --   PLT 216 232  --    Basic Metabolic Panel: Recent Labs  Lab 08/31/19 0445 09/01/19 0448 09/02/19 0224  NA 139 138 137  K 4.3 4.9 4.9  CL 103 101 103  CO2 22 24 22   GLUCOSE 169* 129* 139*  BUN 91* 86* 83*  CREATININE 3.10* 2.83* 2.54*  CALCIUM 8.7* 8.7* 8.6*   GFR: CrCl cannot be calculated (Unknown ideal weight.). Liver Function Tests: Recent Labs  Lab 08/31/19 0445  AST 33  ALT 15  ALKPHOS 102  BILITOT 0.7  PROT 7.5  ALBUMIN 2.4*   No results for input(s): LIPASE, AMYLASE in the last 168 hours. No results for input(s): AMMONIA in the last 168 hours. Coagulation Profile: Recent Labs  Lab 09/04/19 0541  INR 1.2   Cardiac Enzymes: Recent Labs  Lab 09/01/19 0448 09/04/19 0541  CKTOTAL 187 31*   BNP (last 3 results) No results for input(s): PROBNP in the last 8760  hours. HbA1C: No results for input(s): HGBA1C in the last 72 hours. CBG: Recent Labs  Lab 09/03/19 0801 09/03/19 1206 09/03/19 1643 09/03/19 2134 09/04/19 0744  GLUCAP 201* 190* 196* 214* 148*   Lipid Profile: No results for input(s): CHOL, HDL, LDLCALC, TRIG, CHOLHDL, LDLDIRECT in the last 72 hours. Thyroid Function Tests: No results for input(s): TSH, T4TOTAL, FREET4, T3FREE, THYROIDAB in the last 72 hours. Anemia Panel: No results for input(s): VITAMINB12, FOLATE, FERRITIN, TIBC, IRON, RETICCTPCT in the last 72 hours. Urine analysis: No results found for: COLORURINE, APPEARANCEUR, LABSPEC, PHURINE, GLUCOSEU, HGBUR, BILIRUBINUR, KETONESUR, PROTEINUR, UROBILINOGEN, NITRITE, LEUKOCYTESUR Sepsis Labs: @LABRCNTIP (procalcitonin:4,lacticidven:4)  ) Recent Results (from the past 240 hour(s))  SARS CORONAVIRUS 2 (TAT 6-24 HRS) Nasopharyngeal Nasopharyngeal Swab     Status: None   Collection Time: 08/31/19  6:24 AM   Specimen: Nasopharyngeal Swab  Result Value Ref Range Status   SARS Coronavirus 2 NEGATIVE NEGATIVE Final     Comment: (NOTE) SARS-CoV-2 target nucleic acids are NOT DETECTED. The SARS-CoV-2 RNA is generally detectable in upper and lower respiratory specimens during the acute phase of infection. Negative results do not preclude SARS-CoV-2 infection, do not rule out co-infections with other pathogens, and should not be used as the sole basis for treatment or other patient management decisions. Negative results must be combined with clinical observations, patient history, and epidemiological information. The expected result is Negative. Fact Sheet for Patients: SugarRoll.be Fact Sheet for Healthcare Providers: https://www.woods-mathews.com/ This test is not yet approved or cleared by the [place] FDA and  has been authorized for detection and/or diagnosis of SARS-CoV-2 by FDA under an Emergency Use Authorization (EUA). This EUA will remain  in effect (meaning this test can be used) for the duration of the COVID-19 declaration under Section 56 4(b)(1) of the Act, 21 U.S.C. section 360bbb-3(b)(1), unless the authorization is terminated or revoked sooner. Performed at [facility], [place] [address] [place], [place] [address]   Culture, blood (routine x 2)     Status: None (Preliminary result)   Collection Time: 08/31/19 12:35 PM   Specimen: BLOOD RIGHT HAND  Result Value Ref Range Status   Specimen Description BLOOD RIGHT HAND  Final   Special Requests   Final    BOTTLES DRAWN AEROBIC ONLY Blood Culture adequate volume   Culture   Final    NO GROWTH 3 DAYS Performed at [facility], [place] [address], [place], [place] [address]    Report Status PENDING  Incomplete  Culture, blood (routine x 2)     Status: None (Preliminary result)   Collection Time: 08/31/19  1:23 PM   Specimen: BLOOD RIGHT HAND  Result Value Ref Range Status   Specimen Description BLOOD RIGHT HAND  Final   Special Requests   Final    BOTTLES DRAWN AEROBIC ONLY Blood  Culture adequate volume   Culture   Final    NO GROWTH 3 DAYS Performed at [facility], [place] [address] [place], [place] [address]    Report Status PENDING  Incomplete  MRSA PCR Screening     Status: Abnormal   Collection Time: 09/02/19  1:31 PM   Specimen: Nasal Mucosa; Nasopharyngeal  Result Value Ref Range Status   MRSA by PCR POSITIVE (A) NEGATIVE Final    Comment:        The GeneXpert MRSA Assay (FDA approved for NASAL specimens only), is one component of a comprehensive MRSA colonization surveillance program. It is not intended  to diagnose MRSA infection nor to guide or monitor treatment for MRSA infections. RESULT CALLED TO, READ BACK BY AND VERIFIED WITH: RN Marguerita Beards [id number] Performed at [facility], [place] [address] [place], [place] [address]       Radiology Studies: [place] Ekg Site Rite  Result Date: 09/03/2019 If [facility] image not attached, placement could not be confirmed due to current cardiac rhythm.    Scheduled Meds:  amLODipine  10 mg Oral Daily   Chlorhexidine Gluconate Cloth  6 each Topical Q0600   cloNIDine  0.3 mg Oral Q8H   heparin  5,000 Units Subcutaneous Q8H   hydrALAZINE  25 mg Oral Q8H   insulin aspart  0-9 Units Subcutaneous TID WC   levothyroxine  50 mcg Oral Q0600   metoprolol tartrate  50 mg Oral BID   mupirocin ointment  1 application Nasal BID   oxyCODONE  20 mg Oral Q8H   predniSONE  40 mg Oral Q breakfast   Continuous Infusions:  DAPTOmycin (CUBICIN)  IV 1,000 mg (09/03/19 2224)     LOS: 4 days   Time Spent in minutes   30 minutes  Audryna Wendt D.O. on 09/04/2019 at 9:51 AM  Between 7am to 7pm - Please see pager noted on [email or web address]  After 7pm go to [email or web address]  And look for the night coverage person covering for me after hours  [facility] Office  [phone]

## 2019-09-05 ENCOUNTER — Inpatient Hospital Stay (HOSPITAL_COMMUNITY): Payer: Medicare Other

## 2019-09-05 ENCOUNTER — Encounter (HOSPITAL_COMMUNITY): Payer: Self-pay | Admitting: Interventional Radiology

## 2019-09-05 HISTORY — PX: IR US GUIDE VASC ACCESS RIGHT: IMG2390

## 2019-09-05 HISTORY — PX: IR FLUORO GUIDE CV LINE RIGHT: IMG2283

## 2019-09-05 LAB — CULTURE, BLOOD (ROUTINE X 2)
Culture: NO GROWTH
Culture: NO GROWTH
Special Requests: ADEQUATE
Special Requests: ADEQUATE

## 2019-09-05 LAB — BASIC METABOLIC PANEL
Anion gap: 16 — ABNORMAL HIGH (ref 5–15)
BUN: 94 mg/dL — ABNORMAL HIGH (ref 8–23)
CO2: 16 mmol/L — ABNORMAL LOW (ref 22–32)
Calcium: 9 mg/dL (ref 8.9–10.3)
Chloride: 106 mmol/L (ref 98–111)
Creatinine, Ser: 3.23 mg/dL — ABNORMAL HIGH (ref 0.61–1.24)
GFR calc Af Amer: 22 mL/min — ABNORMAL LOW (ref >60)
GFR calc non Af Amer: 19 mL/min — ABNORMAL LOW (ref >60)
Glucose, Bld: 134 mg/dL — ABNORMAL HIGH (ref 70–99)
Potassium: 5.9 mmol/L — ABNORMAL HIGH (ref 3.5–5.1)
Sodium: 138 mmol/L (ref 135–145)

## 2019-09-05 LAB — RENAL FUNCTION PANEL
Albumin: 2.6 g/dL — ABNORMAL LOW (ref 3.5–5.0)
Anion gap: 11 (ref 5–15)
BUN: 95 mg/dL — ABNORMAL HIGH (ref 8–23)
CO2: 19 mmol/L — ABNORMAL LOW (ref 22–32)
Calcium: 8.8 mg/dL — ABNORMAL LOW (ref 8.9–10.3)
Chloride: 108 mmol/L (ref 98–111)
Creatinine, Ser: 3.03 mg/dL — ABNORMAL HIGH (ref 0.61–1.24)
GFR calc Af Amer: 24 mL/min — ABNORMAL LOW (ref >60)
GFR calc non Af Amer: 20 mL/min — ABNORMAL LOW (ref >60)
Glucose, Bld: 135 mg/dL — ABNORMAL HIGH (ref 70–99)
Phosphorus: 7.3 mg/dL — ABNORMAL HIGH (ref 2.5–4.6)
Potassium: 4.9 mmol/L (ref 3.5–5.1)
Sodium: 138 mmol/L (ref 135–145)

## 2019-09-05 LAB — HEMOGLOBIN AND HEMATOCRIT, BLOOD
HCT: 31.1 % — ABNORMAL LOW (ref 39.0–52.0)
Hemoglobin: 10 g/dL — ABNORMAL LOW (ref 13.0–17.0)

## 2019-09-05 LAB — GLUCOSE, CAPILLARY
Glucose-Capillary: 150 mg/dL — ABNORMAL HIGH (ref 70–99)
Glucose-Capillary: 167 mg/dL — ABNORMAL HIGH (ref 70–99)
Glucose-Capillary: 202 mg/dL — ABNORMAL HIGH (ref 70–99)
Glucose-Capillary: 261 mg/dL — ABNORMAL HIGH (ref 70–99)

## 2019-09-05 MED ORDER — SODIUM CHLORIDE 0.9 % IV SOLN
INTRAVENOUS | Status: DC
  Administered 2019-09-05 (×2): via INTRAVENOUS
  Administered 2019-09-06: 75 mL/h via INTRAVENOUS
  Administered 2019-09-07 – 2019-09-11 (×6): via INTRAVENOUS

## 2019-09-05 MED ORDER — LIDOCAINE HCL 1 % IJ SOLN
INTRAMUSCULAR | Status: AC
  Filled 2019-09-05: qty 20

## 2019-09-05 MED ORDER — HEPARIN SODIUM (PORCINE) 5000 UNIT/ML IJ SOLN
5000.0000 [IU] | Freq: Three times a day (TID) | INTRAMUSCULAR | Status: DC
  Administered 2019-09-05 – 2019-10-09 (×102): 5000 [IU] via SUBCUTANEOUS
  Filled 2019-09-05 (×102): qty 1

## 2019-09-05 MED ORDER — LIDOCAINE HCL 1 % IJ SOLN
INTRAMUSCULAR | Status: DC | PRN
  Administered 2019-09-05: 7 mL
  Administered 2019-09-05: 8 mL

## 2019-09-05 NOTE — Progress Notes (Signed)
PT Cancellation Note  Patient Details Name: Thomas [name] MRN: [id number] DOB: 1951-01-21   Cancelled Treatment:    Reason Eval/Treat Not Completed: (P) Pain limiting ability to participate(Nurse medicating patient will return in 1/2 hour for PT session.)   Cristela Blue 09/05/2019, 3:43 PM  [name] , PTA Acute Rehabilitation Services Pager [phone] Office [phone]

## 2019-09-05 NOTE — Progress Notes (Signed)
Physical Therapy Treatment Patient Details Name: Thomas [name] MRN: [id number] DOB: [birth year] Today's Date: 09/05/2019    History of Present Illness Patient is a 68 year old male history of diabetes mellitus type 2, hypertension, chronic kidney disease stage III, anemia, R BKA Sept 2019, who was admitted at [facility] after having fever chills and back pain. Patient transferred from [facility] for MRSA bacteremia. CT abdomen showing discitis involvling T10, T11 area.    PT Comments    Pt performed progression to rolling.  He continues to make slow gains and rolled to R and L side with IV pain meds on board.  He performed LE supine exercises as well with no need for AAROM.  Pt remains limited due to severe pelvis pain.  Based on his presentation he remains appropriate for SNF placement at this time.     Follow Up Recommendations  SNF     Equipment Recommendations  (defer to post acute rehab)    Recommendations for Other Services       Precautions / Restrictions Precautions Precautions: Fall Precaution Comments: Contact precautions MRSA, scab on incision of residual limb Restrictions Weight Bearing Restrictions: No    Mobility  Bed Mobility Overal bed mobility: Needs Assistance Bed Mobility: Rolling Rolling: Mod assist;Min assist         General bed mobility comments: mod assistance to roll to the L and min assistance to roll to the R.  He was more painful rolling to the L and required assistance to stabilize R hip and shoulder when rolling to the L.  Performed boosting to [facility] with bed in trendelenberg position with use of head board to boost to [facility] unassisted.   Transfers Overall transfer level: (NT)               General transfer comment: Pt continues to make slow progress OOB transferred not performed this session.  Ambulation/Gait                 Stairs             Wheelchair Mobility    Modified Rankin (Stroke Patients  Only)       Balance Overall balance assessment: Needs assistance                                          Cognition Arousal/Alertness: Awake/alert Behavior During Therapy: WFL for tasks assessed/performed Overall Cognitive Status: Within Functional Limits for tasks assessed                                 General Comments: Tangential      Exercises General Exercises - Lower Extremity Ankle Circles/Pumps: AROM;Left;10 reps;Supine Quad Sets: AROM;Both;10 reps;Supine Hip ABduction/ADduction: AROM;Both;10 reps;Supine Straight Leg Raises: AROM;Both;10 reps;Supine    General Comments        Pertinent Vitals/Pain Pain Assessment: Faces Pain Score: 8  Faces Pain Scale: Hurts little more Pain Location: pelvis Pain Descriptors / Indicators: Discomfort Pain Intervention(s): Monitored during session;Repositioned    Home Living                      Prior Function            PT Goals (current goals can now be found in the care plan section) Acute Rehab PT Goals Patient  Stated Goal: none stated Potential to Achieve Goals: Fair Progress towards PT goals: Progressing toward goals    Frequency    Min 3X/week      PT Plan Current plan remains appropriate    Co-evaluation              AM-PAC PT "6 Clicks" Mobility   Outcome Measure  Help needed turning from your back to your side while in a flat bed without using bedrails?: A Lot Help needed moving from lying on your back to sitting on the side of a flat bed without using bedrails?: A Lot Help needed moving to and from a bed to a chair (including a wheelchair)?: A Lot Help needed standing up from a chair using your arms (e.g., wheelchair or bedside chair)?: A Lot Help needed to walk in hospital room?: Total Help needed climbing 3-5 steps with a railing? : Total 6 Click Score: 10    End of Session   Activity Tolerance: Patient tolerated treatment well;Patient limited  by pain Patient left: in bed;with call bell/phone within reach;with bed alarm set Nurse Communication: Mobility status PT Visit Diagnosis: Difficulty in walking, not elsewhere classified (R26.2);Muscle weakness (generalized) (M62.81);Unsteadiness on feet (R26.81)     Time: [id number] PT Time Calculation (min) (ACUTE ONLY): 24 min  Charges:  $Therapeutic Exercise: 8-22 mins $Therapeutic Activity: 8-22 mins                     Thomas [name] , PTA Acute Rehabilitation Services Pager [phone] Office [phone]     Thomas [name] 09/05/2019, 5:00 PM

## 2019-09-05 NOTE — Progress Notes (Signed)
Occupational Therapy Treatment Patient Details Name: Thomas [name] MRN: [id number] DOB: [birth year] Today's Date: 09/05/2019    History of present illness Patient is a 68 year old male history of diabetes mellitus type 2, hypertension, chronic kidney disease stage III, anemia, R BKA Sept 2019, who was admitted at [facility] after having fever chills and back pain. Patient transferred from [facility] for MRSA bacteremia. CT abdomen showing discitis involvling T10, T11 area.   OT comments  Pt with limited participation this session. Pt received medications prior to OT arrival but needing total A for any type of bed mobility and repositioning with pt resistive to movement. Pt does not verbalize pain but does say "No" when asked to roll or attempt to sit  EOB. Wash cloth placed in hand and pt needing hand over hand assistance to initiate washing face. Pt declining further intervention and falling asleep as therapist exited the room. Limited participation this session affecting therapist ability to assess mobility further.  Follow Up Recommendations  SNF    Equipment Recommendations  Other (comment)(defer to next venue of care)       Precautions / Restrictions Precautions Precautions: Fall Precaution Comments: Contact precautions MRSA, scab on incision of residual limb Restrictions Weight Bearing Restrictions: No       Mobility Bed Mobility Overal bed mobility: Needs Assistance Bed Mobility: Rolling Rolling: Total assist         General bed mobility comments: Pt not cooperative with mobility and resistive to movement/giving very little effort  Transfers      General transfer comment: pt refused        ADL either performed or assessed with clinical judgement   ADL       Grooming: Moderate assistance;Set up;Bed level            Vision Baseline Vision/History: Wears glasses Wears Glasses: At all times            Cognition Arousal/Alertness:  Awake/alert Behavior During Therapy: WFL for tasks assessed/performed Overall Cognitive Status: Within Functional Limits for tasks assessed                        Pertinent Vitals/ Pain       Pain Assessment: Faces Faces Pain Scale: Hurts little more Pain Location: generalized Pain Descriptors / Indicators: Discomfort Pain Intervention(s): Monitored during session;Premedicated before session;Repositioned         Frequency  Min 2X/week        Progress Toward Goals  OT Goals(current goals can now be found in the care plan section)  Progress towards OT goals: Not progressing toward goals - comment(due to lack of participation this session)  Acute Rehab OT Goals Patient Stated Goal: none stated  Plan Discharge plan remains appropriate       AM-PAC OT "6 Clicks" Daily Activity     Outcome Measure   Help from another person eating meals?: None Help from another person taking care of personal grooming?: A Lot Help from another person toileting, which includes using toliet, bedpan, or urinal?: Total Help from another person bathing (including washing, rinsing, drying)?: Total Help from another person to put on and taking off regular upper body clothing?: A Lot Help from another person to put on and taking off regular lower body clothing?: Total 6 Click Score: 11    End of Session    OT Visit Diagnosis: Pain Pain - part of body: (generalized)   Activity Tolerance Patient limited by lethargy;Patient  limited by fatigue   Patient Left in bed;with call bell/phone within reach;with bed alarm set   Nurse Communication          Time: 1059-1110 OT Time Calculation (min): 11 min  Charges: OT General Charges $OT Visit: 1 Visit OT Treatments $Therapeutic Activity: 8-22 mins   Cornel Werber P, MS, OTR/L 09/05/2019, 11:38 AM

## 2019-09-05 NOTE — Procedures (Signed)
Pre procedural Diagnosis: Poor venous access Post Procedural Diagnosis: Same  Successful placement of right IJ approach 24 cm single lumen PICC line with tip at the superior caval-atrial junction.    EBL: None  No immediate post procedural complication.  The PICC line is ready for immediate use.  Ronny Bacon, MD Pager #: [phone]

## 2019-09-05 NOTE — Progress Notes (Signed)
PROGRESS NOTE    Thomas [name]  KZS:[id number] DOB: 1951-01-23 DOA: 08/31/2019 PCP: Antonietta Jewel, MD   Brief Narrative:  HPI on 08/31/2019 by Dr. Gean Birchwood Thomas [name] is a 68 y.o. male with history of diabetes mellitus type 2, hypertension, chronic kidney disease stage III, anemia who was admitted at [facility] after having fever chills and back pain.  Patient does have a history of chronic back pain which acutely worsened.  Prior to this patient has had a right below-knee amputation following which patient was found to be infected with MRSA and was supposed to be on daptomycin but per discharge summary did not receive.  Prior to this admission patient had fever for 1 day.  As per the discharge summary COVID-19 test was negative.  Work-up in the ED showed leukocytosis with normocytic anemia hyponatremia creatinine of 3.7 baseline is usually around 2.7.  Urinalysis was positive for UTI chest x-ray showed nothing acute.  Right knee x-ray showed some irregularity suspected to be an infection.  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  Patient was placed on IV daptomycin.  Pain was worsening.  2D echo did not show anything acute.  Patient will need MRI with sedation and TEE for which patient was transferred to [facility].  On my exam patient still has back pain radiating to both legs which increased on movement of both legs.  Has had mild chest discomfort by the time I examined.  EKG troponin chest x-ray has been ordered all of which are pending.  Prior to transfer patient has had Foley catheter placement due to urinary retention.  Interim history Patient transferred from [facility] for MRSA bacteremia with possible need of TEE and MRI.  Discussed with infectious disease, Dr. Linus Salmons, TEE and MRI not needed at this time.  Recommended repeat TTE in 4 to [redacted] weeks along with IV antibiotics for approximately 8 weeks.  Currently s/p placement of  right IJ Assessment & Plan   MRSA bacteremia/discitis -CT abdomen showing discitis involvling T10, T11 area  -Currently on daptomycin -Infectious disease consulted and associated - Discussed with Dr. Linus Salmons- no need for MRI as CT showed discitis. Patient had a TTE which was not concerning for vegetation.  Recommended repeat TTE in 4 to 6 weeks.  Recommended IV antibiotics of daptomycin for 8 weeks.  Patient will need a PICC line when blood cultures are negative for 48 hours.  Monitor CK levels. -Last CK level on 09/04/2019: 31 -PT and OT recommended SNF -Blood cultures currently negative to date  -IR consulted and appreciated, s/p placement of right IJ approach 24 cm single lumen PICC line with tip at the superior caval-atrial junction.    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, stage IV -Baseline creatinine approximately 2.7, on admission creatinine up to 3.1 -Improved however today was 3.23- repeated and currently 3.03 -Will place on IVF and monitor BMP  Anemia of chronic disease -baseline Hemoglobin approximately 9 -Stable, hemoglobin currently 10  Back pain -continue OxyContin and IV morphine as needed for now -patient complains of back spasms -Was placed on robaxin, however states that this does not work for him and requesting zanaflex -Continue zanaflex 84m TID prn  Hiccups -appear to have resolved -was given one dose of valium   History of right below-knee amputation  Deconditioning -Likely multifactorial including current  infection, acute on chronic back pain, and hospitalization -PT, OT recommending SNF -Had long discussion with patient today, and he is now refusing rehab and wishes to go home.  He states that he will commute from home to the hospital to receive his IV antibiotics. -Case management  made aware  DVT Prophylaxis  Heparin  Code Status: Full  Family Communication: None at baseline  Disposition Plan: Admitted, continue IV antibiotics. Dispo TBD Consultants Infectious disease Interventional radiology   Procedures  Placement of right IJ approach 24 cm single lumen PICC line with tip at the superior caval-atrial junction.     Antibiotics   Anti-infectives (From admission, onward)   Start     Dose/Rate Route Frequency Ordered Stop   09/02/19 2000  DAPTOmycin (CUBICIN) 1,000 mg in sodium chloride 0.9 % IVPB  Status:  Discontinued     1,000 mg 240 mL/hr over 30 Minutes Intravenous Every 48 hours 08/31/19 1205 09/02/19 1206   09/02/19 2000  DAPTOmycin (CUBICIN) 1,000 mg in sodium chloride 0.9 % IVPB     1,000 mg 240 mL/hr over 30 Minutes Intravenous Every 24 hours 09/02/19 1206     08/31/19 1000  DAPTOmycin (CUBICIN) 750 mg in sodium chloride 0.9 % IVPB  Status:  Discontinued     750 mg 230 mL/hr over 30 Minutes Intravenous Every 48 hours 08/31/19 0414 08/31/19 1205      Subjective:   Thomas [name] seen and examined today.  Continues to have back pain.  Refuses rehab placement.  States he would like to return home.  Denies current chest pain, shortness breath, abdominal pain, nausea or vomiting, diarrhea or constipation. Objective:   Vitals:   09/04/19 2145 09/04/19 2145 09/05/19 0613 09/05/19 1031  BP: (!) 141/89 (!) 141/89 (!) 178/86 (!) 149/87  Pulse: 74 74 75 82  Resp: 16   18  Temp: 97.9 F (36.6 C) 97.9 F (36.6 C) 97.6 F (36.4 C)   TempSrc: Oral Oral Oral   SpO2: 97% 97% 100% 98%  Weight:        Intake/Output Summary (Last 24 hours) at 09/05/2019 1034 Last data filed at 09/04/2019 1835 Gross per 24 hour  Intake 540 ml  Output 1725 ml  Net -1185 ml   Filed Weights   09/01/19 0700 09/02/19 0500 09/03/19 0441  Weight: 117 kg 115.6 kg 114.6 kg   Exam  General: Well developed, chronically ill-appearing, NAD  HEENT: NCAT, mucous membranes  moist.   Cardiovascular: S1 S2 auscultated, RRR  Respiratory: Clear to auscultation bilaterally  Abdomen: Soft, nontender, nondistended, + bowel sounds  Extremities: warm dry without cyanosis clubbing or edema of LLE.  R BKA  Neuro: AAOx3, nonfocal  Psych: Appropriate, however question his judgment and insight into his medical problems  Data Reviewed: I have personally reviewed following labs and imaging studies  CBC: Recent Labs  Lab 08/31/19 0445 09/01/19 0448 09/02/19 0224 09/05/19 0302  WBC 7.1 7.1  --   --   NEUTROABS 5.2  --   --   --   HGB 9.4* 9.6* 9.6* 10.0*  HCT 30.1* 31.6* 30.8* 31.1*  MCV 95.6 96.3  --   --   PLT 216 232  --   --    Basic Metabolic Panel: Recent Labs  Lab 08/31/19 0445 09/01/19 0448 09/02/19 0224 09/05/19 0302 09/05/19 0718  NA 139 138 137 138 138  K 4.3 4.9 4.9 5.9* 4.9  CL 103 101 103 106 108  CO2 '22 24 22 ' 16* 19*  GLUCOSE 169* 129* 139* 134* 135*  BUN 91* 86* 83* 94* 95*  CREATININE 3.10* 2.83* 2.54* 3.23* 3.03*  CALCIUM 8.7* 8.7* 8.6* 9.0 8.8*  PHOS  --   --   --   --  7.3*   GFR: CrCl cannot be calculated (Unknown ideal weight.). Liver Function Tests: Recent Labs  Lab 08/31/19 0445 09/05/19 0718  AST 33  --   ALT 15  --   ALKPHOS 102  --   BILITOT 0.7  --   PROT 7.5  --   ALBUMIN 2.4* 2.6*   No results for input(s): LIPASE, AMYLASE in the last 168 hours. No results for input(s): AMMONIA in the last 168 hours. Coagulation Profile: Recent Labs  Lab 09/04/19 0541  INR 1.2   Cardiac Enzymes: Recent Labs  Lab 09/01/19 0448 09/04/19 0541  CKTOTAL 187 31*   BNP (last 3 results) No results for input(s): PROBNP in the last 8760 hours. HbA1C: No results for input(s): HGBA1C in the last 72 hours. CBG: Recent Labs  Lab 09/04/19 0744 09/04/19 1132 09/04/19 1700 09/04/19 2159 09/05/19 0803  GLUCAP 148* 240* 265* 240* 150*   Lipid Profile: No results for input(s): CHOL, HDL, LDLCALC, TRIG, CHOLHDL,  LDLDIRECT in the last 72 hours. Thyroid Function Tests: No results for input(s): TSH, T4TOTAL, FREET4, T3FREE, THYROIDAB in the last 72 hours. Anemia Panel: No results for input(s): VITAMINB12, FOLATE, FERRITIN, TIBC, IRON, RETICCTPCT in the last 72 hours. Urine analysis: No results found for: COLORURINE, APPEARANCEUR, LABSPEC, PHURINE, GLUCOSEU, HGBUR, BILIRUBINUR, KETONESUR, PROTEINUR, UROBILINOGEN, NITRITE, LEUKOCYTESUR Sepsis Labs: '@LABRCNTIP' (procalcitonin:4,lacticidven:4)  ) Recent Results (from the past 240 hour(s))  SARS CORONAVIRUS 2 (TAT 6-24 HRS) Nasopharyngeal Nasopharyngeal Swab     Status: None   Collection Time: 08/31/19  6:24 AM   Specimen: Nasopharyngeal Swab  Result Value Ref Range Status   SARS Coronavirus 2 NEGATIVE NEGATIVE Final    Comment: (NOTE) SARS-CoV-2 target nucleic acids are NOT DETECTED. The SARS-CoV-2 RNA is generally detectable in upper and lower respiratory specimens during the acute phase of infection. Negative results do not preclude SARS-CoV-2 infection, do not rule out co-infections with other pathogens, and should not be used as the sole basis for treatment or other patient management decisions. Negative results must be combined with clinical observations, patient history, and epidemiological information. The expected result is Negative. Fact Sheet for Patients: SugarRoll.be Fact Sheet for Healthcare Providers: https://www.woods-mathews.com/ This test is not yet approved or cleared by the [place] FDA and  has been authorized for detection and/or diagnosis of SARS-CoV-2 by FDA under an Emergency Use Authorization (EUA). This EUA will remain  in effect (meaning this test can be used) for the duration of the COVID-19 declaration under Section 56 4(b)(1) of the Act, 21 U.S.C. section 360bbb-3(b)(1), unless the authorization is terminated or revoked sooner. Performed at [facility], [place] [address], [place], [place] [address]   Culture, blood (routine x 2)     Status: None   Collection Time: 08/31/19 12:35 PM   Specimen: BLOOD RIGHT HAND  Result Value Ref Range Status   Specimen Description BLOOD RIGHT HAND  Final   Special Requests   Final    BOTTLES DRAWN AEROBIC ONLY Blood Culture adequate volume   Culture   Final    NO GROWTH 5 DAYS Performed at [facility], [place] [address], [place], [place] [address]    Report Status 09/05/2019 FINAL  Final  Culture, blood (routine x 2)  Status: None   Collection Time: 08/31/19  1:23 PM   Specimen: BLOOD RIGHT HAND  Result Value Ref Range Status   Specimen Description BLOOD RIGHT HAND  Final   Special Requests   Final    BOTTLES DRAWN AEROBIC ONLY Blood Culture adequate volume   Culture   Final    NO GROWTH 5 DAYS Performed at [facility], [address] [place], [place] [address]    Report Status 09/05/2019 FINAL  Final  MRSA PCR Screening     Status: Abnormal   Collection Time: 09/02/19  1:31 PM   Specimen: Nasal Mucosa; Nasopharyngeal  Result Value Ref Range Status   MRSA by PCR POSITIVE (A) NEGATIVE Final    Comment:        The GeneXpert MRSA Assay (FDA approved for NASAL specimens only), is one component of a comprehensive MRSA colonization surveillance program. It is not intended to diagnose MRSA infection nor to guide or monitor treatment for MRSA infections. RESULT CALLED TO, READ BACK BY AND VERIFIED WITH: RN Marguerita Beards [id number] Performed at [facility], [place] [address] [place], [place] [address]       Radiology Studies: Ir Fluoro Guide Cv Line Right  Result Date: 09/05/2019 INDICATION: Poor venous access. Please place tunneled central venous catheter for long-term antibiotic administration. EXAM: TUNNELED PICC LINE WITH ULTRASOUND AND FLUOROSCOPIC GUIDANCE MEDICATIONS: None. The antibiotic was given in an appropriate time interval prior to skin puncture. ANESTHESIA/SEDATION: None  FLUOROSCOPY TIME:  12 seconds (1.7 mGy) COMPLICATIONS: None immediate. PROCEDURE: Informed written consent was obtained from the patient after a discussion of the risks, benefits, and alternatives to treatment. Questions regarding the procedure were encouraged and answered. The right neck and chest were prepped with chlorhexidine in a sterile fashion, and a sterile drape was applied covering the operative field. Maximum barrier sterile technique with sterile gowns and gloves were used for the procedure. A timeout was performed prior to the initiation of the procedure. After the overlying soft tissues were anesthetized, a small venotomy incision was created and a micropuncture kit was utilized to access the internal jugular vein. Real-time ultrasound guidance was utilized for vascular access including the acquisition of a permanent ultrasound image documenting patency of the accessed vessel. The microwire was utilized to measure appropriate catheter length. The micropuncture sheath was exchanged for a peel-away sheath over a guidewire. A 5 French single lumen tunneled central venous catheter measuring 24 cm was tunneled in a retrograde fashion from the anterior chest wall to the venotomy incision. The catheter was then placed through the peel-away sheath with tip ultimately positioned at the superior caval-atrial junction. Final catheter positioning was confirmed and documented with a spot radiographic image. The catheter aspirates and flushes normally. The catheter was flushed with appropriate volume heparin dwells. The catheter exit site was secured with a 0-Prolene retention suture. The venotomy incision was closed with Dermabond and Steri-strips. Dressings were applied. The patient tolerated the procedure well without immediate post procedural complication. FINDINGS: After catheter placement, the tip lies within the superior cavoatrial junction. The catheter aspirates and flushes normally and is ready for  immediate use. IMPRESSION: Successful placement of 24 cm single lumen tunneled central venous catheter via the right internal jugular vein with tip terminating at the superior caval atrial junction. The catheter is ready for immediate use. Electronically Signed   By: Sandi Mariscal M.D.   On: 09/05/2019 09:58   Ir US Guide Vasc Access Right  Result Date: 09/05/2019 INDICATION: Poor venous access. Please place tunneled central venous catheter for long-term antibiotic administration. EXAM: TUNNELED PICC LINE WITH ULTRASOUND AND FLUOROSCOPIC GUIDANCE MEDICATIONS: None. The antibiotic was given in an appropriate time interval prior to skin puncture. ANESTHESIA/SEDATION: None FLUOROSCOPY TIME:  12 seconds (1.7 mGy) COMPLICATIONS: None immediate. PROCEDURE: Informed written consent was obtained from the patient after a discussion of the risks, benefits, and alternatives to treatment. Questions regarding the procedure were encouraged and answered. The right neck and chest were prepped with chlorhexidine in a sterile fashion, and a sterile drape was applied covering the operative field. Maximum barrier sterile technique with sterile gowns and gloves were used for the procedure. A timeout was performed prior to the initiation of the procedure. After the overlying soft tissues were anesthetized, a small venotomy incision was created and a micropuncture kit was utilized to access the internal jugular vein. Real-time ultrasound guidance was utilized for vascular access including the acquisition of a permanent ultrasound image documenting patency of the accessed vessel. The microwire was utilized to measure appropriate catheter length. The micropuncture sheath was exchanged for a peel-away sheath over a guidewire. A 5 French single lumen tunneled central venous catheter measuring 24 cm was tunneled in a retrograde fashion from the anterior chest wall to the venotomy incision. The catheter was then placed through the peel-away  sheath with tip ultimately positioned at the superior caval-atrial junction. Final catheter positioning was confirmed and documented with a spot radiographic image. The catheter aspirates and flushes normally. The catheter was flushed with appropriate volume heparin dwells. The catheter exit site was secured with a 0-Prolene retention suture. The venotomy incision was closed with Dermabond and Steri-strips. Dressings were applied. The patient tolerated the procedure well without immediate post procedural complication. FINDINGS: After catheter placement, the tip lies within the superior cavoatrial junction. The catheter aspirates and flushes normally and is ready for immediate use. IMPRESSION: Successful placement of 24 cm single lumen tunneled central venous catheter via the right internal jugular vein with tip terminating at the superior caval atrial junction. The catheter is ready for immediate use. Electronically Signed   By: Sandi Mariscal M.D.   On: 09/05/2019 09:58     Scheduled Meds:  amLODipine  10 mg Oral Daily   Chlorhexidine Gluconate Cloth  6 each Topical Q0600   cloNIDine  0.3 mg Oral Q8H   heparin  5,000 Units Subcutaneous Q8H   hydrALAZINE  25 mg Oral Q8H   insulin aspart  0-9 Units Subcutaneous TID WC   levothyroxine  50 mcg Oral Q0600   lidocaine       metoprolol tartrate  50 mg Oral BID   mupirocin ointment  1 application Nasal BID   oxyCODONE  20 mg Oral Q8H   predniSONE  40 mg Oral Q breakfast   Continuous Infusions:  sodium chloride     DAPTOmycin (CUBICIN)  IV 1,000 mg (09/04/19 2047)     LOS: 5 days   Time Spent in minutes   30 minutes  Jessamyn Watterson D.O. on 09/05/2019 at 10:34 AM  Between 7am to 7pm - Please see pager noted on [email or web address]  After 7pm go to [email or web address]  And look for the night coverage person covering for me after hours  [facility] Office  [phone]

## 2019-09-05 NOTE — Progress Notes (Signed)
Pharmacy Antibiotic Note  Thomas KAPPLE is a 68 y.o. male admitted on 08/31/2019 with MRSA bacteremia and discitis T10/11.  Pharmacy has been consulted for Daptomycin dosing.  ID: MRSA bacteremia and discitis T10/11. TTE shows no vegetation. ID recommended 8 wks daptomycin. WBC wnl, AF, Scr 3.03 up and down (CrCl with TBW 38) - PICC 11/16  11/13 MRSA PCR: pos 11/11 Bcx2: ngtd 11/6 [place] BCx: MRSA   11/11: Dapto>> (10/26/19)  11/12: CK 187 11/15: CK 31 low  Plan: Daptomycin 1000 mg Q 24hours (~ 8 mg/kg)  CK levels weekly OPAT orders pended 11/13    Weight: 252 lb 10.4 oz (114.6 kg)  Temp (24hrs), Avg:98 F (36.7 C), Min:97.6 F (36.4 C), Max:98.6 F (37 C)  Recent Labs  Lab 08/31/19 0445 09/01/19 0448 09/02/19 0224 09/05/19 0302 09/05/19 0718  WBC 7.1 7.1  --   --   --   CREATININE 3.10* 2.83* 2.54* 3.23* 3.03*    CrCl cannot be calculated (Unknown ideal weight.).    Allergies  Allergen Reactions  . Other Other (See Comments)    Blood pressure issues Per [facility] hospital: Pt states he can only take these pain meds or else he gets very sick: Oxycontin,morphine,demerol, and dilaudid are the only pain meds pt states he can take.    Marland Kitchen Oxycodone Other (See Comments)    Cases extreme changes in Blood pressure Cases extreme changes in Blood pressure   . Oxycodone-Acetaminophen Other (See Comments)    Blood pressure issues, rash from acetaminophen, pt states he can take tylenol though.  . Oxymorphone Other (See Comments)    Causes kidney problems  . Aspirin Nausea And Vomiting    Per [facility]  . Beta Vulgaris Other (See Comments)  . Buspirone Other (See Comments)  . Cabbage Other (See Comments)  . Codeine Other (See Comments)  . Fish Allergy Other (See Comments)  . Fish-Derived Products   . Gabapentin Other (See Comments)    Per [facility] papers, severe internal bleeding from colon  . Pentazocine   . Propoxyphene Other (See Comments)  .  Shellfish Allergy Other (See Comments)  . Shellfish-Derived Products   . Sulfa Antibiotics Hives  . Sulfasalazine Other (See Comments)  . Amoxicillin Rash    Per Ransom Alford Highland, PharmD, [place] Clinical Staff Pharmacist [phone] Eilene Ghazi [facility] 09/05/2019 9:26 AM

## 2019-09-06 ENCOUNTER — Other Ambulatory Visit: Payer: Self-pay

## 2019-09-06 LAB — BASIC METABOLIC PANEL
Anion gap: 12 (ref 5–15)
BUN: 95 mg/dL — ABNORMAL HIGH (ref 8–23)
CO2: 17 mmol/L — ABNORMAL LOW (ref 22–32)
Calcium: 8.9 mg/dL (ref 8.9–10.3)
Chloride: 109 mmol/L (ref 98–111)
Creatinine, Ser: 3 mg/dL — ABNORMAL HIGH (ref 0.61–1.24)
GFR calc Af Amer: 24 mL/min — ABNORMAL LOW (ref >60)
GFR calc non Af Amer: 21 mL/min — ABNORMAL LOW (ref >60)
Glucose, Bld: 154 mg/dL — ABNORMAL HIGH (ref 70–99)
Potassium: 6.2 mmol/L — ABNORMAL HIGH (ref 3.5–5.1)
Sodium: 138 mmol/L (ref 135–145)

## 2019-09-06 LAB — GLUCOSE, CAPILLARY
Glucose-Capillary: 119 mg/dL — ABNORMAL HIGH (ref 70–99)
Glucose-Capillary: 212 mg/dL — ABNORMAL HIGH (ref 70–99)
Glucose-Capillary: 198 mg/dL — ABNORMAL HIGH (ref 70–99)
Glucose-Capillary: 224 mg/dL — ABNORMAL HIGH (ref 70–99)

## 2019-09-06 LAB — POTASSIUM: Potassium: 5.6 mmol/L — ABNORMAL HIGH (ref 3.5–5.1)

## 2019-09-06 MED ORDER — SODIUM ZIRCONIUM CYCLOSILICATE 5 G PO PACK
5.0000 g | PACK | Freq: Every day | ORAL | Status: AC
  Administered 2019-09-06: 5 g via ORAL
  Filled 2019-09-06: qty 1

## 2019-09-06 NOTE — Progress Notes (Signed)
PROGRESS NOTE    Thomas [name]  EPP:[id number] DOB: [birth year] DOA: 08/31/2019 PCP: Thomas Jewel, MD   Brief Narrative:  HPI on 08/31/2019 by Thomas [name] Thomas [name] is a 69 y.o. male with history of diabetes mellitus type 2, hypertension, chronic kidney disease stage III, anemia who was admitted at [facility] after having fever chills and back pain.  Patient does have a history of chronic back pain which acutely worsened.  Prior to this patient has had a right below-knee amputation following which patient was found to be infected with MRSA and was supposed to be on daptomycin but per discharge summary did not receive.  Prior to this admission patient had fever for 1 day.  As per the discharge summary COVID-19 test was negative.  Work-up in the ED showed leukocytosis with normocytic anemia hyponatremia creatinine of 3.7 baseline is usually around 2.7.  Urinalysis was positive for UTI chest x-ray showed nothing acute.  Right knee x-ray showed some irregularity suspected to be an infection.  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  Patient was placed on IV daptomycin.  Pain was worsening.  2D echo did not show anything acute.  Patient will need MRI with sedation and TEE for which patient was transferred to [facility].  On my exam patient still has back pain radiating to both legs which increased on movement of both legs.  Has had mild chest discomfort by the time I examined.  EKG troponin chest x-ray has been ordered all of which are pending.  Prior to transfer patient has had Foley catheter placement due to urinary retention.  Interim history Patient transferred from [facility] for MRSA bacteremia with possible need of TEE and MRI.  Discussed with infectious disease, Dr. Linus [name], TEE and MRI not needed at this time.  Recommended repeat TTE in 4 to [redacted] weeks along with IV antibiotics for approximately 8 weeks.  Currently s/p placement of  right IJ. Now with hyperkalemia.  Assessment & Plan   MRSA bacteremia/discitis -CT abdomen showing discitis involvling T10, T11 area  -Currently on daptomycin -Infectious disease consulted and associated - Discussed with Dr. Linus [name]- no need for MRI as CT showed discitis. Patient had a TTE which was not concerning for vegetation.  Recommended repeat TTE in 4 to 6 weeks.  Recommended IV antibiotics of daptomycin for 8 weeks.  Patient will need a PICC line when blood cultures are negative for 48 hours.  Monitor CK levels. -Last CK level on 09/04/2019: 31 -PT and OT recommended SNF -Blood cultures currently negative to date  -IR consulted and appreciated, s/p placement of right IJ approach 24 cm single lumen PICC line with tip at the superior caval-atrial junction.   -Patient refusing SNF placement.  States that he wants to return daily to the hospital to receive his IV antibiotics.  Case management made aware.  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, stage IV -Baseline creatinine approximately 2.7, on admission creatinine up to 3.1 -Improved however today was 3.23- repeated and currently 3 -Continue IV fluid and monitor BMP  Hyperkalemia -Potassium 6.2, will give lokelma and continue to monitor BMP  Anemia of chronic disease -baseline Hemoglobin approximately 9 -Stable, hemoglobin currently 10  Back pain -continue OxyContin and IV morphine as needed for now -patient complains of back spasms -Was placed on robaxin, however states  that this does not work for him and requesting zanaflex -Continue zanaflex 68m TID prn  Hiccups -appear to have resolved -was given one dose of valium   History of right below-knee amputation  Deconditioning -Likely multifactorial including current infection, acute on chronic back  pain, and hospitalization -PT, OT recommending SNF -Had long discussion with patient today, and he is now refusing rehab and wishes to go home.  He states that he will commute from home to the hospital to receive his IV antibiotics. -Case management made aware  DVT Prophylaxis  Heparin  Code Status: Full  Family Communication: None at baseline  Disposition Plan: Admitted, continue IV antibiotics. Dispo TBD  Consultants Infectious disease Interventional radiology   Procedures  Placement of right IJ approach 24 cm single lumen PICC line with tip at the superior caval-atrial junction.     Antibiotics   Anti-infectives (From admission, onward)   Start     Dose/Rate Route Frequency Ordered Stop   09/02/19 2000  DAPTOmycin (CUBICIN) 1,000 mg in sodium chloride 0.9 % IVPB  Status:  Discontinued     1,000 mg 240 mL/hr over 30 Minutes Intravenous Every 48 hours 08/31/19 1205 09/02/19 1206   09/02/19 2000  DAPTOmycin (CUBICIN) 1,000 mg in sodium chloride 0.9 % IVPB     1,000 mg 240 mL/hr over 30 Minutes Intravenous Every 24 hours 09/02/19 1206     08/31/19 1000  DAPTOmycin (CUBICIN) 750 mg in sodium chloride 0.9 % IVPB  Status:  Discontinued     750 mg 230 mL/hr over 30 Minutes Intravenous Every 48 hours 08/31/19 0414 08/31/19 1205      Subjective:   Thomas [name] and examined today.  Continues to have back pain but states it is improved since admission.  Refuses rehab placement.  Wants to go home back and forth to the hospital to get his antibiotics.  Denies current chest pain, shortness of breath, abdominal pain, nausea or vomiting, diarrhea or constipation. Objective:   Vitals:   09/06/19 0400 09/06/19 0500 09/06/19 0527 09/06/19 0534  BP:    (!) 155/88  Pulse:    81  Resp: '19  12 14  ' Temp:    98 F (36.7 C)  TempSrc:    Oral  SpO2:    95%  Weight:  113 kg    Height:        Intake/Output Summary (Last 24 hours) at 09/06/2019 1045 Last data filed at 09/06/2019  0900 Gross per 24 hour  Intake 2418.68 ml  Output 1325 ml  Net 1093.68 ml   Filed Weights   09/03/19 0441 09/05/19 1542 09/06/19 0500  Weight: 114.6 kg 114.9 kg 113 kg   Exam  General: Well developed, chronically ill-appearing, NAD  HEENT: NCAT, mucous membranes moist.   Cardiovascular: S1 S2 auscultated, RRR  Respiratory: Clear to auscultation bilaterally with equal chest rise  Abdomen: Soft, nontender, nondistended, + bowel sounds  Extremities: warm dry without cyanosis clubbing or edema of LLE.  Right BKA  Neuro: AAOx3, nonfocal  Psych: Appropriate mood and affect however question his insight  Data Reviewed: I have personally reviewed following labs and imaging studies  CBC: Recent Labs  Lab 08/31/19 0445 09/01/19 0448 09/02/19 0224 09/05/19 0302  WBC 7.1 7.1  --   --   NEUTROABS 5.2  --   --   --   HGB 9.4* 9.6* 9.6* 10.0*  HCT 30.1* 31.6* 30.8* 31.1*  MCV 95.6 96.3  --   --   PLT  216 232  --   --    Basic Metabolic Panel: Recent Labs  Lab 09/01/19 0448 09/02/19 0224 09/05/19 0302 09/05/19 0718 09/06/19 0305 09/06/19 0712  NA 138 137 138 138 138  --   K 4.9 4.9 5.9* 4.9 6.2* 5.6*  CL 101 103 106 108 109  --   CO2 24 22 16* 19* 17*  --   GLUCOSE 129* 139* 134* 135* 154*  --   BUN 86* 83* 94* 95* 95*  --   CREATININE 2.83* 2.54* 3.23* 3.03* 3.00*  --   CALCIUM 8.7* 8.6* 9.0 8.8* 8.9  --   PHOS  --   --   --  7.3*  --   --    GFR: Estimated Creatinine Clearance: 34.3 mL/min (A) (by C-G formula based on SCr of 3 mg/dL (H)). Liver Function Tests: Recent Labs  Lab 08/31/19 0445 09/05/19 0718  AST 33  --   ALT 15  --   ALKPHOS 102  --   BILITOT 0.7  --   PROT 7.5  --   ALBUMIN 2.4* 2.6*   No results for input(s): LIPASE, AMYLASE in the last 168 hours. No results for input(s): AMMONIA in the last 168 hours. Coagulation Profile: Recent Labs  Lab 09/04/19 0541  INR 1.2   Cardiac Enzymes: Recent Labs  Lab 09/01/19 0448 09/04/19 0541    CKTOTAL 187 31*   BNP (last 3 results) No results for input(s): PROBNP in the last 8760 hours. HbA1C: No results for input(s): HGBA1C in the last 72 hours. CBG: Recent Labs  Lab 09/05/19 0803 09/05/19 1234 09/05/19 1611 09/05/19 2206 09/06/19 0750  GLUCAP 150* 167* 202* 261* 119*   Lipid Profile: No results for input(s): CHOL, HDL, LDLCALC, TRIG, CHOLHDL, LDLDIRECT in the last 72 hours. Thyroid Function Tests: No results for input(s): TSH, T4TOTAL, FREET4, T3FREE, THYROIDAB in the last 72 hours. Anemia Panel: No results for input(s): VITAMINB12, FOLATE, FERRITIN, TIBC, IRON, RETICCTPCT in the last 72 hours. Urine analysis: No results found for: COLORURINE, APPEARANCEUR, LABSPEC, PHURINE, GLUCOSEU, HGBUR, BILIRUBINUR, KETONESUR, PROTEINUR, UROBILINOGEN, NITRITE, LEUKOCYTESUR Sepsis Labs: '@LABRCNTIP' (procalcitonin:4,lacticidven:4)  ) Recent Results (from the past 240 hour(s))  SARS CORONAVIRUS 2 (TAT 6-24 HRS) Nasopharyngeal Nasopharyngeal Swab     Status: None   Collection Time: 08/31/19  6:24 AM   Specimen: Nasopharyngeal Swab  Result Value Ref Range Status   SARS Coronavirus 2 NEGATIVE NEGATIVE Final    Comment: (NOTE) SARS-CoV-2 target nucleic acids are NOT DETECTED. The SARS-CoV-2 RNA is generally detectable in upper and lower respiratory specimens during the acute phase of infection. Negative results do not preclude SARS-CoV-2 infection, do not rule out co-infections with other pathogens, and should not be used as the sole basis for treatment or other patient management decisions. Negative results must be combined with clinical observations, patient history, and epidemiological information. The expected result is Negative. Fact Sheet for Patients: SugarRoll.be Fact Sheet for Healthcare Providers: https://www.woods-mathews.com/ This test is not yet approved or cleared by the [place] FDA and  has been authorized for  detection and/or diagnosis of SARS-CoV-2 by FDA under an Emergency Use Authorization (EUA). This EUA will remain  in effect (meaning this test can be used) for the duration of the COVID-19 declaration under Section 56 4(b)(1) of the Act, 21 U.S.C. section 360bbb-3(b)(1), unless the authorization is terminated or revoked sooner. Performed at [facility], [place] [address] [place], [place] [address]   Culture, blood (routine x 2)  Status: None   Collection Time: 08/31/19 12:35 PM   Specimen: BLOOD RIGHT HAND  Result Value Ref Range Status   Specimen Description BLOOD RIGHT HAND  Final   Special Requests   Final    BOTTLES DRAWN AEROBIC ONLY Blood Culture adequate volume   Culture   Final    NO GROWTH 5 DAYS Performed at [facility], [address], [place], [place] [address]    Report Status 09/05/2019 FINAL  Final  Culture, blood (routine x 2)     Status: None   Collection Time: 08/31/19  1:23 PM   Specimen: BLOOD RIGHT HAND  Result Value Ref Range Status   Specimen Description BLOOD RIGHT HAND  Final   Special Requests   Final    BOTTLES DRAWN AEROBIC ONLY Blood Culture adequate volume   Culture   Final    NO GROWTH 5 DAYS Performed at [facility], [place] [address] [place], [place] [address]    Report Status 09/05/2019 FINAL  Final  MRSA PCR Screening     Status: Abnormal   Collection Time: 09/02/19  1:31 PM   Specimen: Nasal Mucosa; Nasopharyngeal  Result Value Ref Range Status   MRSA by PCR POSITIVE (A) NEGATIVE Final    Comment:        The GeneXpert MRSA Assay (FDA approved for NASAL specimens only), is one component of a comprehensive MRSA colonization surveillance program. It is not intended to diagnose MRSA infection nor to guide or monitor treatment for MRSA infections. RESULT CALLED TO, READ BACK BY AND VERIFIED WITH: RN Marguerita Beards [phone] MLM Performed at [facility], [place] [address] [place], [place] [address]       Radiology  Studies: Ir Fluoro Guide Cv Line Right  Result Date: 09/05/2019 INDICATION: Poor venous access. Please place tunneled central venous catheter for long-term antibiotic administration. EXAM: TUNNELED PICC LINE WITH ULTRASOUND AND FLUOROSCOPIC GUIDANCE MEDICATIONS: None. The antibiotic was given in an appropriate time interval prior to skin puncture. ANESTHESIA/SEDATION: None FLUOROSCOPY TIME:  12 seconds (1.7 mGy) COMPLICATIONS: None immediate. PROCEDURE: Informed written consent was obtained from the patient after a discussion of the risks, benefits, and alternatives to treatment. Questions regarding the procedure were encouraged and answered. The right neck and chest were prepped with chlorhexidine in a sterile fashion, and a sterile drape was applied covering the operative field. Maximum barrier sterile technique with sterile gowns and gloves were used for the procedure. A timeout was performed prior to the initiation of the procedure. After the overlying soft tissues were anesthetized, a small venotomy incision was created and a micropuncture kit was utilized to access the internal jugular vein. Real-time ultrasound guidance was utilized for vascular access including the acquisition of a permanent ultrasound image documenting patency of the accessed vessel. The microwire was utilized to measure appropriate catheter length. The micropuncture sheath was exchanged for a peel-away sheath over a guidewire. A 5 French single lumen tunneled central venous catheter measuring 24 cm was tunneled in a retrograde fashion from the anterior chest wall to the venotomy incision. The catheter was then placed through the peel-away sheath with tip ultimately positioned at the superior caval-atrial junction. Final catheter positioning was confirmed and documented with a spot radiographic image. The catheter aspirates and flushes normally. The catheter was flushed with appropriate volume heparin dwells. The catheter exit site was  secured with a 0-Prolene retention suture. The venotomy incision was closed with Dermabond and Steri-strips. Dressings were  applied. The patient tolerated the procedure well without immediate post procedural complication. FINDINGS: After catheter placement, the tip lies within the superior cavoatrial junction. The catheter aspirates and flushes normally and is ready for immediate use. IMPRESSION: Successful placement of 24 cm single lumen tunneled central venous catheter via the right internal jugular vein with tip terminating at the superior caval atrial junction. The catheter is ready for immediate use. Electronically Signed   By: Sandi Mariscal M.D.   On: 09/05/2019 09:58   Ir US Guide Vasc Access Right  Result Date: 09/05/2019 INDICATION: Poor venous access. Please place tunneled central venous catheter for long-term antibiotic administration. EXAM: TUNNELED PICC LINE WITH ULTRASOUND AND FLUOROSCOPIC GUIDANCE MEDICATIONS: None. The antibiotic was given in an appropriate time interval prior to skin puncture. ANESTHESIA/SEDATION: None FLUOROSCOPY TIME:  12 seconds (1.7 mGy) COMPLICATIONS: None immediate. PROCEDURE: Informed written consent was obtained from the patient after a discussion of the risks, benefits, and alternatives to treatment. Questions regarding the procedure were encouraged and answered. The right neck and chest were prepped with chlorhexidine in a sterile fashion, and a sterile drape was applied covering the operative field. Maximum barrier sterile technique with sterile gowns and gloves were used for the procedure. A timeout was performed prior to the initiation of the procedure. After the overlying soft tissues were anesthetized, a small venotomy incision was created and a micropuncture kit was utilized to access the internal jugular vein. Real-time ultrasound guidance was utilized for vascular access including the acquisition of a permanent ultrasound image documenting patency of the  accessed vessel. The microwire was utilized to measure appropriate catheter length. The micropuncture sheath was exchanged for a peel-away sheath over a guidewire. A 5 French single lumen tunneled central venous catheter measuring 24 cm was tunneled in a retrograde fashion from the anterior chest wall to the venotomy incision. The catheter was then placed through the peel-away sheath with tip ultimately positioned at the superior caval-atrial junction. Final catheter positioning was confirmed and documented with a spot radiographic image. The catheter aspirates and flushes normally. The catheter was flushed with appropriate volume heparin dwells. The catheter exit site was secured with a 0-Prolene retention suture. The venotomy incision was closed with Dermabond and Steri-strips. Dressings were applied. The patient tolerated the procedure well without immediate post procedural complication. FINDINGS: After catheter placement, the tip lies within the superior cavoatrial junction. The catheter aspirates and flushes normally and is ready for immediate use. IMPRESSION: Successful placement of 24 cm single lumen tunneled central venous catheter via the right internal jugular vein with tip terminating at the superior caval atrial junction. The catheter is ready for immediate use. Electronically Signed   By: Sandi Mariscal M.D.   On: 09/05/2019 09:58     Scheduled Meds:  amLODipine  10 mg Oral Daily   Chlorhexidine Gluconate Cloth  6 each Topical Q0600   cloNIDine  0.3 mg Oral Q8H   heparin  5,000 Units Subcutaneous Q8H   hydrALAZINE  25 mg Oral Q8H   insulin aspart  0-9 Units Subcutaneous TID WC   levothyroxine  50 mcg Oral Q0600   metoprolol tartrate  50 mg Oral BID   mupirocin ointment  1 application Nasal BID   oxyCODONE  20 mg Oral Q8H   predniSONE  40 mg Oral Q breakfast   Continuous Infusions:  sodium chloride 75 mL/hr at 09/06/19 0700   DAPTOmycin (CUBICIN)  IV Stopped (09/05/19 2128)       LOS: 6 days  Time Spent in minutes   30 minutes  Cande Mastropietro D.O. on 09/06/2019 at 10:45 AM  Between 7am to 7pm - Please see pager noted on [email or web address]  After 7pm go to [email or web address]  And look for the night coverage person covering for me after hours  [facility] Office  [phone]

## 2019-09-07 LAB — BASIC METABOLIC PANEL
Anion gap: 13 (ref 5–15)
BUN: 91 mg/dL — ABNORMAL HIGH (ref 8–23)
CO2: 18 mmol/L — ABNORMAL LOW (ref 22–32)
Calcium: 7.4 mg/dL — ABNORMAL LOW (ref 8.9–10.3)
Chloride: 108 mmol/L (ref 98–111)
Creatinine, Ser: 3.08 mg/dL — ABNORMAL HIGH (ref 0.61–1.24)
GFR calc Af Amer: 23 mL/min — ABNORMAL LOW (ref >60)
GFR calc non Af Amer: 20 mL/min — ABNORMAL LOW (ref >60)
Glucose, Bld: 177 mg/dL — ABNORMAL HIGH (ref 70–99)
Potassium: 6.1 mmol/L — ABNORMAL HIGH (ref 3.5–5.1)
Sodium: 139 mmol/L (ref 135–145)

## 2019-09-07 LAB — CBC
HCT: 29.6 % — ABNORMAL LOW (ref 39.0–52.0)
Hemoglobin: 9.1 g/dL — ABNORMAL LOW (ref 13.0–17.0)
MCH: 29.5 pg (ref 26.0–34.0)
MCHC: 30.7 g/dL (ref 30.0–36.0)
MCV: 96.1 fL (ref 80.0–100.0)
Platelets: 284 10*3/uL (ref 150–400)
RBC: 3.08 MIL/uL — ABNORMAL LOW (ref 4.22–5.81)
RDW: 15.3 % (ref 11.5–15.5)
WBC: 7.9 10*3/uL (ref 4.0–10.5)
nRBC: 0 % (ref 0.0–0.2)

## 2019-09-07 LAB — GLUCOSE, CAPILLARY
Glucose-Capillary: 124 mg/dL — ABNORMAL HIGH (ref 70–99)
Glucose-Capillary: 158 mg/dL — ABNORMAL HIGH (ref 70–99)
Glucose-Capillary: 167 mg/dL — ABNORMAL HIGH (ref 70–99)
Glucose-Capillary: 186 mg/dL — ABNORMAL HIGH (ref 70–99)

## 2019-09-07 MED ORDER — TAMSULOSIN HCL 0.4 MG PO CAPS
0.4000 mg | ORAL_CAPSULE | Freq: Every day | ORAL | Status: DC
  Administered 2019-09-07 – 2019-10-09 (×33): 0.4 mg via ORAL
  Filled 2019-09-07 (×34): qty 1

## 2019-09-07 MED ORDER — HYDROMORPHONE HCL 2 MG PO TABS
1.0000 mg | ORAL_TABLET | ORAL | Status: DC | PRN
  Administered 2019-09-07 – 2019-09-14 (×25): 1 mg via ORAL
  Filled 2019-09-07 (×26): qty 1

## 2019-09-07 MED ORDER — SODIUM ZIRCONIUM CYCLOSILICATE 5 G PO PACK
5.0000 g | PACK | Freq: Every day | ORAL | Status: AC
  Administered 2019-09-07: 5 g via ORAL
  Filled 2019-09-07: qty 1

## 2019-09-07 MED ORDER — VITAMIN B-12 5000 MCG PO TBDP: 2500.0000 ug | ORAL_TABLET | Freq: Every day | ORAL | Status: DC

## 2019-09-07 MED ORDER — MORPHINE SULFATE (PF) 2 MG/ML IV SOLN
2.0000 mg | Freq: Four times a day (QID) | INTRAVENOUS | Status: DC | PRN
  Administered 2019-09-07 – 2019-09-09 (×3): 2 mg via INTRAVENOUS
  Filled 2019-09-07 (×4): qty 1

## 2019-09-07 MED ORDER — FERROUS SULFATE 325 (65 FE) MG PO TABS
325.0000 mg | ORAL_TABLET | Freq: Two times a day (BID) | ORAL | Status: DC
  Administered 2019-09-07 – 2019-10-09 (×65): 325 mg via ORAL
  Filled 2019-09-07 (×66): qty 1

## 2019-09-07 MED ORDER — CHLORHEXIDINE GLUCONATE CLOTH 2 % EX PADS
6.0000 | MEDICATED_PAD | Freq: Every day | CUTANEOUS | Status: DC
  Administered 2019-09-07 – 2019-10-09 (×31): 6 via TOPICAL

## 2019-09-07 MED ORDER — GABAPENTIN 300 MG PO CAPS
300.0000 mg | ORAL_CAPSULE | Freq: Three times a day (TID) | ORAL | Status: DC
  Administered 2019-09-07 – 2019-09-22 (×22): 300 mg via ORAL
  Filled 2019-09-07: qty 3
  Filled 2019-09-07 (×18): qty 1
  Filled 2019-09-07: qty 3
  Filled 2019-09-07 (×4): qty 1
  Filled 2019-09-07: qty 3
  Filled 2019-09-07: qty 1
  Filled 2019-09-07: qty 3
  Filled 2019-09-07 (×10): qty 1
  Filled 2019-09-07: qty 3
  Filled 2019-09-07 (×6): qty 1

## 2019-09-07 MED ORDER — VITAMIN B-12 1000 MCG PO TABS
2500.0000 ug | ORAL_TABLET | Freq: Every day | ORAL | Status: DC
  Administered 2019-09-07 – 2019-10-09 (×33): 2500 ug via ORAL
  Filled 2019-09-07 (×4): qty 3
  Filled 2019-09-07 (×3): qty 5
  Filled 2019-09-07: qty 2
  Filled 2019-09-07 (×3): qty 3
  Filled 2019-09-07: qty 5
  Filled 2019-09-07: qty 3
  Filled 2019-09-07: qty 5
  Filled 2019-09-07: qty 3
  Filled 2019-09-07: qty 5
  Filled 2019-09-07: qty 3
  Filled 2019-09-07: qty 5
  Filled 2019-09-07: qty 3
  Filled 2019-09-07: qty 5
  Filled 2019-09-07: qty 3
  Filled 2019-09-07: qty 2
  Filled 2019-09-07: qty 5
  Filled 2019-09-07 (×2): qty 3
  Filled 2019-09-07: qty 5
  Filled 2019-09-07: qty 3
  Filled 2019-09-07: qty 5
  Filled 2019-09-07: qty 3
  Filled 2019-09-07: qty 5
  Filled 2019-09-07: qty 3
  Filled 2019-09-07 (×2): qty 5

## 2019-09-07 MED ORDER — PREDNISONE 10 MG PO TABS
10.0000 mg | ORAL_TABLET | Freq: Every day | ORAL | Status: DC
  Administered 2019-09-07 – 2019-09-09 (×3): 10 mg via ORAL
  Filled 2019-09-07 (×2): qty 1

## 2019-09-07 MED ORDER — ALLOPURINOL 100 MG PO TABS
100.0000 mg | ORAL_TABLET | Freq: Two times a day (BID) | ORAL | Status: DC
  Administered 2019-09-07 – 2019-10-09 (×65): 100 mg via ORAL
  Filled 2019-09-07 (×66): qty 1

## 2019-09-07 MED ORDER — HYDRALAZINE HCL 50 MG PO TABS
50.0000 mg | ORAL_TABLET | Freq: Three times a day (TID) | ORAL | Status: DC
  Administered 2019-09-07 – 2019-10-09 (×96): 50 mg via ORAL
  Filled 2019-09-07 (×96): qty 1

## 2019-09-07 NOTE — Progress Notes (Signed)
NCM received call from pt's best friend Ephrim. Ephrim informed NCM he would not be able to assist with pt's IV ABX therapy. States he is sick , thinks he has "strep". Brother BJ states he cant assist as well. NCM called brother Shanon Brow who's wife is a retired Therapist, sports for help. Shanon Brow stated unable to talk , will call NCM back. TOC team will continue to monitor. Whitman Hero RN,BSN,CM

## 2019-09-07 NOTE — Progress Notes (Signed)
Physical Therapy Treatment Patient Details Name: Thomas [name] MRN: [id number] DOB: [birth year] Today's Date: 09/07/2019    History of Present Illness Patient is a 68 year old male history of diabetes mellitus type 2, hypertension, chronic kidney disease stage III, anemia, R BKA Sept 2019, who was admitted at [facility] after having fever chills and back pain. Patient transferred from [facility] for MRSA bacteremia. CT abdomen showing discitis involvling T10, T11 area.    PT Comments    Patient progressing minimally able to get up to sitting EOB briefly still limited by pain in L groin that goes up his spine.  States pain is much better, but still not willing to push through to try and stand, sat up at my insistence.  States maybe he could get up with his cane and crutch.  Will bring next session to try and get into standing.  Feel he remains most appropriate for SNF level rehab, though if he refuses would need HHPT, aide and assist.   Follow Up Recommendations  SNF     Equipment Recommendations  Other (comment)(defer to post acute rehab)    Recommendations for Other Services       Precautions / Restrictions Precautions Precautions: Fall    Mobility  Bed Mobility Overal bed mobility: Needs Assistance Bed Mobility: Rolling;Sidelying to Sit;Sit to Sidelying Rolling: Supervision Sidelying to sit: Supervision     Sit to sidelying: Min assist General bed mobility comments: used rail to roll to R; assist for L leg into bed to sidelying, able to lift up to sitting but could not get all the way straight due to pain  Transfers                    Ambulation/Gait                 Stairs             Wheelchair Mobility    Modified Rankin (Stroke Patients Only)       Balance Overall balance assessment: Needs assistance Sitting-balance support: Feet supported;Single extremity supported Sitting balance-[name]: Poor Sitting balance -  Comments: UE support needed due to could not get to sitting upright                                    Cognition Arousal/Alertness: Awake/alert Behavior During Therapy: WFL for tasks assessed/performed Overall Cognitive Status: Within Functional Limits for tasks assessed                                 General Comments: questionable decision making, feels he can go home and come for outpatient antibiotics, but not able to get OOB      Exercises Other Exercises Other Exercises: single knee to chest stretch 3 x 10 sec hold bilateral with L used towel behind knee    General Comments        Pertinent Vitals/Pain Pain Assessment: Faces Faces Pain Scale: Hurts whole lot Pain Location: L groin with attempts at sitting Pain Descriptors / Indicators: Grimacing;Guarding Pain Intervention(s): Monitored during session;Repositioned;Limited activity within patient's tolerance    Home Living                      Prior Function            PT Goals (current  goals can now be found in the care plan section) Progress towards PT goals: Progressing toward goals    Frequency           PT Plan Current plan remains appropriate    Co-evaluation              AM-PAC PT "6 Clicks" Mobility   Outcome Measure  Help needed turning from your back to your side while in a flat bed without using bedrails?: A Little Help needed moving from lying on your back to sitting on the side of a flat bed without using bedrails?: A Little Help needed moving to and from a bed to a chair (including a wheelchair)?: A Lot Help needed standing up from a chair using your arms (e.g., wheelchair or bedside chair)?: A Lot Help needed to walk in hospital room?: Total   6 Click Score: 11    End of Session   Activity Tolerance: Patient limited by pain Patient left: in bed   PT Visit Diagnosis: Difficulty in walking, not elsewhere classified (R26.2);Muscle weakness  (generalized) (M62.81);Unsteadiness on feet (R26.81)     Time: [id number] PT Time Calculation (min) (ACUTE ONLY): 16 min  Charges:  $Therapeutic Activity: 8-22 mins                     [name], [place] [facility] [phone] 09/07/2019    [name] 09/07/2019, 5:16 PM

## 2019-09-07 NOTE — Progress Notes (Signed)
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  Daptomycin/ceftriaxone Developed DVT right axillary vein, subsegmental PE right lung DM TY 2 CKD 3 anemia Admitted to [facility] WBC slightly up creatinine 3.7 up from 2.7 UA positive Right knee x-ray?  Irregularity?  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 Healing wound to left lower extremity Continue IV daptomycin until 10/26/2019 outpatient follow-up with ID--PICC line placed 1116 to be removed subsequently Wound to left lower extremity needs to be followed by his podiatrist who is seen him in the past it looks clean continue Prevalon boot Patient wishes to go home as he has had bad experiences at SNF Patient has been to multiple pain clinics in the past and states that oxycodone "almost killed him" He states he can take Dilaudid however We will start Dilaudid 1 mg every 3 and space out IV morphine to every 6, continue OxyContin 20 every 8  CKD 3-4, hyperkalemia, metabolic acidosis Creatinine BUN still elevated Potassium still elevated at 6.1  Lokelma giving another dose 5 m If bicarb continues to be low will start in a.m. Continue IV saline 75 cc/h for now  Right axillary DVT 2019+ subsegmental PE right lung States was on anticoagulation for about 7 months and came off of it Outpatient follow-up  DM TY 2, Home Meds glipizide 10 twice daily CBGs ranging 124-224 eating 50 to  100% of meals Continue sliding scale Continue gabapentin 300 3 times daily On prednisone 40?  Gout Supposed to be on Pegloticase q. 14 days-outpatient follow-up  HTN Blood pressures suboptimally controlled 130s to 160s-continue amlodipine 10 clonidine 0.3 every 8, hydralazine increased to 50 every 8 from 25 and metoprolol 50 twice daily Does not need telemetry discontinued ordered  BPH Continue Flomax  Anemia of iron deficiency Resume FeSO4 and vitamin B12  Subjective: Awake coherent pleasant no distress eating drinking no fever no chills no nausea no vomiting no chest pain no cough no cold Consultants:   Infectious disease Procedures:   PICC 09/05/2019 Antimicrobials:   Daptomycin 08/31/19---End date 10/26/2019  Objective: Vitals:   09/06/19 1304 09/06/19 2032 09/07/19 0629 09/07/19 0630  BP: (!) 160/85 133/84 (!) 162/83 (!) 162/83  Pulse: 72 69 66   Resp: 18 17 18    Temp: 97.8 F (36.6 C) 98.2 F (36.8 C) 97.7 F (36.5 C)   TempSrc: Oral Oral Oral   SpO2: 99% 99% 99%   Weight:      Height:        Intake/Output Summary (Last 24 hours) at 09/07/2019 0749 Last data filed at 09/07/2019 0602 Gross per 24 hour  Intake 2323.55 ml  Output 4000 ml  Net -1676.45 ml   Filed Weights   09/03/19 0441 09/05/19 1542 09/06/19 0500  Weight: 114.6 kg 114.9 kg 113 kg    Examination: EOMI NCAT slightly sleepy but arouses readily pupils equally reactive to light Chest clinically clear no added sounds S1-S2 no murmur rub or gallop Abdomen soft nontender no rebound no guarding ROM intact Heel of left foot  has a 7 cm scar at the base with another punctate area in the midfoot and then at the ball of his foot another small area-this was wrapped in an Unna boot Right BKA area looks clean  Data Reviewed: I have personally reviewed following labs and imaging studies  Scheduled Meds: . allopurinol  100 mg Oral BID  . amLODipine  10 mg Oral Daily  . Chlorhexidine Gluconate  Cloth  6 each Topical Daily  . cloNIDine  0.3 mg Oral Q8H  . heparin  5,000 Units Subcutaneous Q8H  . hydrALAZINE  50 mg Oral Q8H  . insulin aspart  0-9 Units Subcutaneous TID WC  . levothyroxine  50 mcg Oral Q0600  . metoprolol tartrate  50 mg Oral BID  . mupirocin ointment  1 application Nasal BID  . oxyCODONE  20 mg Oral Q8H  . predniSONE  40 mg Oral Q breakfast  . sodium zirconium cyclosilicate  5 g Oral Daily  . tamsulosin  0.4 mg Oral Daily  . Vitamin B-12  2,500 mcg Oral Daily   Continuous Infusions: . sodium chloride 75 mL/hr (09/06/19 2208)  . DAPTOmycin (CUBICIN)  IV 1,000 mg (09/06/19 2129)    Radiology Studies: Reviewed images personally in health database    LOS: 7 days   Time spent: [place], MD [facility]  09/07/2019, 7:49 AM

## 2019-09-07 NOTE — TOC Progression Note (Addendum)
Transition of Care [facility]) - Progression Note    Patient Details  Name: Thomas [name] MRN: [id number] Date of Birth: [birth year]  [facility] CM/SW Contact  Sharin Mons, RN Phone Number: 09/07/2019, 8:43 AM  Clinical Narrative:    MRSA bacteremia. Pt declines SNF placement. Agreeable to home health services. Pt will need LT IV ABX therapy. Home IV infusion is  in place. [facility] will provide IV ABX therapy and [facility] will provide home health services ( RN, PT,OT) pending MD's orders.  HH orders/ face to face will be needed from MD for home health services.  Per [facility] liaison [name] start of care can begin on tomorrow @ 2pm. Best  friend, Ephirm [phone]) who was once a med. tech, will help with home infusion.    NCM will continue to monitor for  TOC needs...  Expected Discharge Plan: [place] Barriers to Discharge: Continued Medical Work up  Expected Discharge Plan and Services Expected Discharge Plan: [place] Choice: [place] arrangements for the past 2 months: Single Family Home                                       Social Determinants of Health (SDOH) Interventions    Readmission Risk Interventions No flowsheet data found.

## 2019-09-08 DIAGNOSIS — L8990 Pressure ulcer of unspecified site, unspecified stage: Secondary | ICD-10-CM | POA: Insufficient documentation

## 2019-09-08 LAB — RENAL FUNCTION PANEL
Albumin: 2.5 g/dL — ABNORMAL LOW (ref 3.5–5.0)
Anion gap: 11 (ref 5–15)
BUN: 82 mg/dL — ABNORMAL HIGH (ref 8–23)
CO2: 18 mmol/L — ABNORMAL LOW (ref 22–32)
Calcium: 8.7 mg/dL — ABNORMAL LOW (ref 8.9–10.3)
Chloride: 110 mmol/L (ref 98–111)
Creatinine, Ser: 2.91 mg/dL — ABNORMAL HIGH (ref 0.61–1.24)
GFR calc Af Amer: 25 mL/min — ABNORMAL LOW (ref >60)
GFR calc non Af Amer: 21 mL/min — ABNORMAL LOW (ref >60)
Glucose, Bld: 136 mg/dL — ABNORMAL HIGH (ref 70–99)
Phosphorus: 6.2 mg/dL — ABNORMAL HIGH (ref 2.5–4.6)
Potassium: 5.4 mmol/L — ABNORMAL HIGH (ref 3.5–5.1)
Sodium: 139 mmol/L (ref 135–145)

## 2019-09-08 LAB — CK: Total CK: 77 U/L (ref 49–397)

## 2019-09-08 LAB — GLUCOSE, CAPILLARY
Glucose-Capillary: 105 mg/dL — ABNORMAL HIGH (ref 70–99)
Glucose-Capillary: 201 mg/dL — ABNORMAL HIGH (ref 70–99)
Glucose-Capillary: 178 mg/dL — ABNORMAL HIGH (ref 70–99)
Glucose-Capillary: 189 mg/dL — ABNORMAL HIGH (ref 70–99)

## 2019-09-08 MED ORDER — DAPTOMYCIN IV (FOR PTA / DISCHARGE USE ONLY): 1000.0000 mg | INTRAVENOUS | 0 refills | Status: DC

## 2019-09-08 MED ORDER — TIZANIDINE HCL 2 MG PO TABS: 2.0000 mg | ORAL_TABLET | Freq: Three times a day (TID) | ORAL | 0 refills | Status: DC | PRN

## 2019-09-08 MED ORDER — HYDRALAZINE HCL 50 MG PO TABS: 50.0000 mg | ORAL_TABLET | Freq: Three times a day (TID) | ORAL | 3 refills | Status: DC

## 2019-09-08 MED ORDER — HYDROMORPHONE HCL 2 MG PO TABS: 1.0000 mg | ORAL_TABLET | ORAL | 0 refills | Status: DC | PRN

## 2019-09-08 MED ORDER — PREDNISONE 10 MG PO TABS: ORAL_TABLET | ORAL | 0 refills | Status: DC

## 2019-09-08 MED ORDER — METOPROLOL TARTRATE 50 MG PO TABS: 50.0000 mg | ORAL_TABLET | Freq: Two times a day (BID) | ORAL | 3 refills | Status: DC

## 2019-09-08 NOTE — Progress Notes (Addendum)
RN paged Samtani MD after morning rounds with charge nurse and case manager. RN informed MD that pt unable to be d/c'd today. There is no teachable contact at home to administer IV antibiotic medication. Pt also refuses SNF placement.

## 2019-09-08 NOTE — Progress Notes (Signed)
Pharmacy Antibiotic Note  Thomas [name] is a 68 y.o. male admitted on 08/31/2019 with MRSA bacteremia and discitis T10/11.  Pharmacy has been consulted for Daptomycin dosing.  ID: MRSA bacteremia and discitis T10/11. TTE shows no vegetation. ID recommended 8 wks daptomycin. WBC wnl, AF, Scr 2.91 - PICC 11/16  11/13 MRSA PCR: pos 11/11 Bcx2: negative 11/6 [place] BCx: MRSA   11/11: Dapto>> (10/26/19)  11/12: CK 187 11/15: CK 31 low 11/19: CK 77   Plan: Daptomycin 1000 mg Q 24hours (~ 8 mg/kg)  CK levels weekly q Thurs OPAT orders pended 11/13    Height: 6\' 6"  (198.1 cm) Weight: 250 lb 10.6 oz (113.7 kg) IBW/kg (Calculated) : 91.4  Temp (24hrs), Avg:98 F (36.7 C), Min:97.7 F (36.5 C), Max:98.2 F (36.8 C)  Recent Labs  Lab 09/05/19 0302 09/05/19 0718 09/06/19 0305 09/07/19 0435 09/08/19 0524  WBC  --   --   --  7.9  --   CREATININE 3.23* 3.03* 3.00* 3.08* 2.91*    Estimated Creatinine Clearance: 35.4 mL/min (A) (by C-G formula based on SCr of 2.91 mg/dL (H)).    Allergies  Allergen Reactions  . Other Other (See Comments)    Blood pressure issues Per [facility] hospital: Pt states he can only take these pain meds or else he gets very sick: Oxycontin,morphine,demerol, and dilaudid are the only pain meds pt states he can take.    Marland Kitchen Oxycodone Other (See Comments)    Cases extreme changes in Blood pressure Cases extreme changes in Blood pressure   . Oxycodone-Acetaminophen Other (See Comments)    Blood pressure issues, rash from acetaminophen, pt states he can take tylenol though.  . Oxymorphone Other (See Comments)    Causes kidney problems  . Aspirin Nausea And Vomiting    Per [facility]  . Beta Vulgaris Other (See Comments)  . Buspirone Other (See Comments)  . Cabbage Other (See Comments)  . Codeine Other (See Comments)  . Fish Allergy Other (See Comments)  . Fish-Derived Products   . Pentazocine   . Propoxyphene Other (See Comments)  .  Shellfish Allergy Other (See Comments)  . Shellfish-Derived Products   . Sulfa Antibiotics Hives  . Sulfasalazine Other (See Comments)  . Amoxicillin Rash    Per [place] Alford [place], PharmD, BCPS Clinical Staff Pharmacist [phone] Eilene Ghazi Stillinger 09/08/2019 9:22 AM

## 2019-09-08 NOTE — Discharge Summary (Signed)
Physician Discharge Summary  Thomas [name] [id number]:[id number] DOB: 1951-09-20 DOA: 08/31/2019  PCP: Antonietta Jewel, MD  Admit date: 08/31/2019 Discharge date: 09/08/2019  Time spent: 35 minutes  Recommendations for Outpatient Follow-up:  1. Continue Cubicin until 10/25/2018 and get screening labs CBC CPK Chem-12 as per protocol 2. Needs follow-up with podiatrist [place] in the outpatient setting 3. Continue Prevalon boots and occlusive dressing to lower extremity 4. Limited prescription of Dilaudid given for 1 to 2 days-needs outpatient follow-up with Dr. 5. Regarding oxycodone refills 6. 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-he will probably will need a fistula placed as he is getting closer to dialysis  Discharge Diagnoses:  Principal Problem:   MRSA bacteremia Active Problems:   [name], right ([place])   [name], type 2) ([place])   HTN (hypertension)   Discitis   Pressure injury of skin   Discharge Condition: Guarded  Diet recommendation: Diabetic renal  Filed Weights   09/05/19 1542 09/06/19 0500 09/08/19 0424  Weight: 114.9 kg 113 kg 113.7 kg    History of present illness:  17 white male chronic lower extremities left foot since 2015 (follows with podiatry [place]), 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  Daptomycin/ceftriaxone Developed DVT right axillary vein, subsegmental PE right lung DM TY 2 CKD 3 anemia Admitted to [facility] WBC slightly up creatinine 3.7 up from 2.7 UA positive Right knee x-ray?  Irregularity?  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 Healing wound to left lower  extremity Continue IV daptomycin until 10/26/2019 outpatient follow-up with ID--PICC line placed 1116 to be removed subsequently Wound to left lower extremity needs to be followed by his podiatrist who is seen him in the past it looks clean continue Prevalon boot Declined skilled placement Continue outpatient doses of OxyContin 20 3 times daily and limited prescription of Dilaudid 1 mg given for 2 to 3 days on discharge  CKD 3-4, hyperkalemia, metabolic acidosis Creatinine BUN still elevated Potassium still elevated at 6.1  Lokelma giving another dose 5 m He will need outpatient renal follow-up-he is at risk for needing dialysis in the near future  Right axillary DVT 2019+ subsegmental PE right lung States was on anticoagulation for about 7 months and came off of it Outpatient follow-up  DM TY 2, Home Meds glipizide 10 twice daily CBGs ranging 124-224 eating 50 to 100% of meals Continue gabapentin 300 3 times daily On prednisone 40?  When I started seeing the patient-tapering doses given with instructions for the same on discharge  Gout Supposed to be on Pegloticase q. 14 days-outpatient follow-up  HTN Blood pressures suboptimally controlled 130s to 160s-continue amlodipine 10 clonidine 0.3 every 8, hydralazine increased to 50 every 8 from 25 and metoprolol 50 twice daily Needs adjustment of meds in the outpatient setting  BPH Continue Flomax  Anemia of iron deficiency Resume FeSO4 and vitamin B12   Discharge Exam: Vitals:   09/08/19 0424 09/08/19 0726  BP: (!) 149/87 (!) 165/93  Pulse: 60 68  Resp: 18   Temp: 97.7 F (36.5 C)   SpO2: 98%     General: Awake alert coherent no distress getting shower as I am talking to him thick neck Cardiovascular:  S1-S2 no murmur rub or gallop Respiratory: Clinically clear no rales no rhonchi Right lower extremity has a stump left lower extremity wound not examined today Abdomen soft   Discharge Instructions   Discharge  Instructions    Diet - low sodium heart healthy   Complete by: As directed    Discharge instructions   Complete by: As directed    You will need to complete your antibiotics as prescribed and need follow-up probably with your podiatrist in the outpatient setting-home health will come out and teach you how to use the IV infusion site and once your antibiotics run through the PICC line will need to be removed You will need labs as per the protocol I would recommend that you take the steroids for short period of time as per my tapering instructions and have called them into your pharmacy You have been prescribed a very limited amount of Dilaudid and this will need to be refilled on an as-needed basis by your primary care physician you can get your oxycodone through him You will need local wound care and Prevalon boot placed at all times when you are laying around Please comply with the blood pressure med increases and we have called in higher doses of those medications on discharge   Home infusion instructions [place] May follow [place] Dosing Protocol; May administer Cathflo as needed to maintain patency of vascular access device.; Flushing of vascular access device: per [facility] Protocol: 0.9% NaCl pre/post medica...   Complete by: As directed    Instructions: May follow [place] Dosing Protocol   Instructions: May administer Cathflo as needed to maintain patency of vascular access device.   Instructions: Flushing of vascular access device: per [facility] Protocol: 0.9% NaCl pre/post medication administration and prn patency; Heparin 100 u/ml, 70m for implanted ports and Heparin 10u/ml, 529mfor all other central venous catheters.   Instructions: May follow [facility] Anaphylaxis Protocol for First Dose Administration in the home: 0.9% NaCl at 25-50 ml/hr to maintain IV access for protocol meds. Epinephrine 0.3 ml IV/IM PRN and Benadryl 25-50 IV/IM PRN s/s of anaphylaxis.   Instructions: [facility] Coordinator (RN) to assist per patient IV care needs in the home PRN.   Increase activity slowly   Complete by: As directed      Allergies as of 09/08/2019      Reactions   Other Other (See Comments)   [facility] Pt states he can only take these pain meds or else he gets very sick: Oxycontin,morphine,demerol, and dilaudid are the only pain meds pt states he can take.    Oxycodone Other (See Comments)   Cases extreme changes in Blood pressure Cases extreme changes in Blood pressure   Oxycodone-acetaminophen Other (See Comments)   Blood pressure issues, rash from acetaminophen, pt states he can take tylenol though.   Oxymorphone Other (See Comments)   Causes kidney problems   Aspirin Nausea And Vomiting   [facility] Beta Vulgaris Other (See Comments)   Buspirone Other (See Comments)   Cabbage Other (See Comments)   Codeine Other (See Comments)   Fish Allergy Other (See Comments)   Fish-derived Products    Pentazocine    Propoxyphene Other (See Comments)   Shellfish Allergy Other (See Comments)   Shellfish-derived Products    Sulfa Antibiotics Hives   Sulfasalazine Other (See Comments)   Amoxicillin Rash   [facility]  Medication List    STOP taking these medications   furosemide 40 MG tablet Commonly known as: LASIX   magnesium oxide 400 MG tablet Commonly known as: MAG-OX     TAKE these medications   allopurinol 100 MG tablet Commonly known as: ZYLOPRIM Take 100 mg by mouth 2 (two) times daily.   amLODipine 10 MG tablet Commonly known as: NORVASC Take 1 tablet by mouth daily.   BENEFIBER PO Take 1 each by mouth daily.   cloNIDine 0.3 MG tablet Commonly known as: CATAPRES Take 0.3 mg by mouth 3 (three) times daily.   daptomycin  IVPB Commonly known as: CUBICIN Inject 1,000 mg into the vein daily. Indication:  Discitis/MRSA bacteremia Last Day of Therapy:  10/26/19 Labs - Once  weekly:  CBC/D, BMP, and CPK Labs - Every other week:  ESR and CRP What changed: additional instructions   ferrous sulfate 325 (65 FE) MG tablet Take 325 mg by mouth 2 (two) times daily.   glipiZIDE 10 MG 24 hr tablet Commonly known as: GLUCOTROL XL Take 10 mg by mouth 2 (two) times daily.   hydrALAZINE 50 MG tablet Commonly known as: APRESOLINE Take 1 tablet (50 mg total) by mouth every 8 (eight) hours. What changed:   medication strength  how much to take  when to take this   HYDROmorphone 2 MG tablet Commonly known as: DILAUDID Take 0.5 tablets (1 mg total) by mouth every 3 (three) hours as needed for severe pain.   metoprolol tartrate 50 MG tablet Commonly known as: LOPRESSOR Take 1 tablet (50 mg total) by mouth 2 (two) times daily. What changed:   medication strength  how much to take   Neurontin 300 MG capsule Generic drug: gabapentin Take 300 mg by mouth 3 (three) times daily as needed.   Oxycodone HCl 20 MG Tabs Take 1 tablet by mouth every 8 (eight) hours as needed.   PEGLOTICASE IV Inject 1 Dose into the vein every 14 (fourteen) days.   polyethylene glycol 17 g packet Commonly known as: MIRALAX / GLYCOLAX Take 17 g by mouth daily.   predniSONE 10 MG tablet Commonly known as: DELTASONE Take 1 tablet (10 mg total) by mouth daily with breakfast for 3 days, THEN 0.5 tablets (5 mg total) daily with breakfast for 3 days. Start taking on: September 09, 2019   Synthroid 50 MCG tablet Generic drug: levothyroxine Take 50 mcg by mouth daily.   tamsulosin 0.4 MG Caps capsule Commonly known as: FLOMAX Take 0.4 mg by mouth daily.   tiZANidine 2 MG tablet Commonly known as: ZANAFLEX Take 1 tablet (2 mg total) by mouth every 8 (eight) hours as needed for muscle spasms.   Vitamin B-12 5000 MCG Tbdp Take 2,500 mcg by mouth daily.   Vitamin D (Ergocalciferol) 1.25 MG (50000 UT) Caps capsule Commonly known as: DRISDOL Take 1 capsule by mouth once a week.             Home Infusion Instuctions  (From admission, onward)         Start     Ordered   09/08/19 0000  Home infusion instructions [facility] May follow [place] Dosing Protocol; May administer Cathflo as needed to maintain patency of vascular access device.; Flushing of vascular access device: per [facility] Protocol: 0.9% NaCl pre/post medica...    Question Answer Comment  Instructions May follow [place] Dosing Protocol   Instructions May administer Cathflo as needed to maintain patency of vascular access device.  Instructions Flushing of vascular access device: per [facility] Protocol: 0.9% NaCl pre/post medication administration and prn patency; Heparin 100 u/ml, 78m for implanted ports and Heparin 10u/ml, 544mfor all other central venous catheters.   Instructions May follow [facility] Anaphylaxis Protocol for First Dose Administration in the home: 0.9% NaCl at 25-50 ml/hr to maintain IV access for protocol meds. Epinephrine 0.3 ml IV/IM PRN and Benadryl 25-50 IV/IM PRN s/s of anaphylaxis.   Instructions [facility] Infusion Coordinator (RN) to assist per patient IV care needs in the home PRN.      09/08/19 [id number]       Allergies  Allergen Reactions  . Other Other (See Comments)    Blood [facility] Pt states he can only take these pain meds or else he gets very sick: Oxycontin,morphine,demerol, and dilaudid are the only pain meds pt states he can take.    . [place] Kitchenxycodone Other (See Comments)    Cases extreme changes in Blood pressure Cases extreme changes in Blood pressure   . Oxycodone-Acetaminophen Other (See Comments)    Blood pressure issues, rash from acetaminophen, pt states he can take tylenol though.  . Oxymorphone Other (See Comments)    Causes kidney problems  . Aspirin Nausea And Vomiting    [facility] Beta Vulgaris Other (See Comments)  . Buspirone Other (See Comments)  . Cabbage Other (See Comments)  . Codeine  Other (See Comments)  . Fish Allergy Other (See Comments)  . Fish-Derived Products   . Pentazocine   . Propoxyphene Other (See Comments)  . Shellfish Allergy Other (See Comments)  . Shellfish-Derived Products   . Sulfa Antibiotics Hives  . Sulfasalazine Other (See Comments)  . Amoxicillin Rash    [facility]    The results of significant diagnostics from this hospitalization (including imaging, microbiology, ancillary and laboratory) are listed below for reference.    Significant Diagnostic Studies: Ir Fluoro Guide Cv Line Right  Result Date: 09/05/2019 INDICATION: Poor venous access. Please place tunneled central venous catheter for long-term antibiotic administration. EXAM: TUNNELED PICC LINE WITH ULTRASOUND AND FLUOROSCOPIC GUIDANCE MEDICATIONS: None. The antibiotic was given in an appropriate time interval prior to skin puncture. ANESTHESIA/SEDATION: None FLUOROSCOPY TIME:  12 seconds (1.7 mGy) COMPLICATIONS: None immediate. PROCEDURE: Informed written consent was obtained from the patient after a discussion of the risks, benefits, and alternatives to treatment. Questions regarding the procedure were encouraged and answered. The right neck and chest were prepped with chlorhexidine in a sterile fashion, and a sterile drape was applied covering the operative field. Maximum barrier sterile technique with sterile gowns and gloves were used for the procedure. A timeout was performed prior to the initiation of the procedure. After the overlying soft tissues were anesthetized, a small venotomy incision was created and a micropuncture kit was utilized to access the internal jugular vein. Real-time ultrasound guidance was utilized for vascular access including the acquisition of a permanent ultrasound image documenting patency of the accessed vessel. The microwire was utilized to measure appropriate catheter length. The micropuncture sheath was exchanged for a peel-away sheath over a  guidewire. A 5 French single lumen tunneled central venous catheter measuring 24 cm was tunneled in a retrograde fashion from the anterior chest wall to the venotomy incision. The catheter was then placed through the peel-away sheath with tip ultimately positioned at the superior caval-atrial junction. Final catheter positioning was confirmed and documented with a spot radiographic image. The  catheter aspirates and flushes normally. The catheter was flushed with appropriate volume heparin dwells. The catheter exit site was secured with a 0-Prolene retention suture. The venotomy incision was closed with Dermabond and Steri-strips. Dressings were applied. The patient tolerated the procedure well without immediate post procedural complication. FINDINGS: After catheter placement, the tip lies within the superior cavoatrial junction. The catheter aspirates and flushes normally and is ready for immediate use. IMPRESSION: Successful placement of 24 cm single lumen tunneled central venous catheter via the right internal jugular vein with tip terminating at the superior caval atrial junction. The catheter is ready for immediate use. Electronically Signed   By: Sandi Mariscal M.D.   On: 09/05/2019 09:58   Ir US Guide Vasc Access Right  Result Date: 09/05/2019 INDICATION: Poor venous access. Please place tunneled central venous catheter for long-term antibiotic administration. EXAM: TUNNELED PICC LINE WITH ULTRASOUND AND FLUOROSCOPIC GUIDANCE MEDICATIONS: None. The antibiotic was given in an appropriate time interval prior to skin puncture. ANESTHESIA/SEDATION: None FLUOROSCOPY TIME:  12 seconds (1.7 mGy) COMPLICATIONS: None immediate. PROCEDURE: Informed written consent was obtained from the patient after a discussion of the risks, benefits, and alternatives to treatment. Questions regarding the procedure were encouraged and answered. The right neck and chest were prepped with chlorhexidine in a sterile fashion, and a  sterile drape was applied covering the operative field. Maximum barrier sterile technique with sterile gowns and gloves were used for the procedure. A timeout was performed prior to the initiation of the procedure. After the overlying soft tissues were anesthetized, a small venotomy incision was created and a micropuncture kit was utilized to access the internal jugular vein. Real-time ultrasound guidance was utilized for vascular access including the acquisition of a permanent ultrasound image documenting patency of the accessed vessel. The microwire was utilized to measure appropriate catheter length. The micropuncture sheath was exchanged for a peel-away sheath over a guidewire. A 5 French single lumen tunneled central venous catheter measuring 24 cm was tunneled in a retrograde fashion from the anterior chest wall to the venotomy incision. The catheter was then placed through the peel-away sheath with tip ultimately positioned at the superior caval-atrial junction. Final catheter positioning was confirmed and documented with a spot radiographic image. The catheter aspirates and flushes normally. The catheter was flushed with appropriate volume heparin dwells. The catheter exit site was secured with a 0-Prolene retention suture. The venotomy incision was closed with Dermabond and Steri-strips. Dressings were applied. The patient tolerated the procedure well without immediate post procedural complication. FINDINGS: After catheter placement, the tip lies within the superior cavoatrial junction. The catheter aspirates and flushes normally and is ready for immediate use. IMPRESSION: Successful placement of 24 cm single lumen tunneled central venous catheter via the right internal jugular vein with tip terminating at the superior caval atrial junction. The catheter is ready for immediate use. Electronically Signed   By: Sandi Mariscal M.D.   On: 09/05/2019 09:58   Dg Chest Port 1 View  Result Date:  08/31/2019 CLINICAL DATA:  Chest pain and shortness of breath EXAM: PORTABLE CHEST 1 VIEW COMPARISON:  08/26/2019 FINDINGS: Cardiac shadow is enlarged but stable. Postsurgical changes are again seen. The lungs are well aerated bilaterally. No focal infiltrate or sizable effusion is seen. Mild persistent left basilar atelectasis is noted. IMPRESSION: Stable left basilar atelectasis. No other focal abnormality is seen. Electronically Signed   By: Inez Catalina M.D.   On: 08/31/2019 20:24   [place] Ekg Site Rite  Result  Date: 09/03/2019 If Site Rite image not attached, placement could not be confirmed due to current cardiac rhythm.   Microbiology: Recent Results (from the past 240 hour(s))  SARS CORONAVIRUS 2 (TAT 6-24 HRS) Nasopharyngeal Nasopharyngeal Swab     Status: None   Collection Time: 08/31/19  6:24 AM   Specimen: Nasopharyngeal Swab  Result Value Ref Range Status   SARS Coronavirus 2 NEGATIVE NEGATIVE Final    Comment: (NOTE) SARS-CoV-2 target nucleic acids are NOT DETECTED. The SARS-CoV-2 RNA is generally detectable in upper and lower respiratory specimens during the acute phase of infection. Negative results do not preclude SARS-CoV-2 infection, do not rule out co-infections with other pathogens, and should not be used as the sole basis for treatment or other patient management decisions. Negative results must be combined with clinical observations, patient history, and epidemiological information. The expected result is Negative. Fact Sheet for Patients: SugarRoll.be Fact Sheet for Healthcare Providers: https://www.woods-mathews.com/ This test is not yet approved or cleared by the [place] FDA and  has been authorized for detection and/or diagnosis of SARS-CoV-2 by FDA under an Emergency Use Authorization (EUA). This EUA will remain  in effect (meaning this test can be used) for the duration of the COVID-19 declaration under Section  56 4(b)(1) of the Act, 21 U.S.C. section 360bbb-3(b)(1), unless the authorization is terminated or revoked sooner. Performed at [facility], [place] [address] [place], [place] [address]   Culture, blood (routine x 2)     Status: None   Collection Time: 08/31/19 12:35 PM   Specimen: BLOOD RIGHT HAND  Result Value Ref Range Status   Specimen Description BLOOD RIGHT HAND  Final   Special Requests   Final    BOTTLES DRAWN AEROBIC ONLY Blood Culture adequate volume   Culture   Final    NO GROWTH 5 DAYS Performed at [facility], [place] [address] [place], [place] [address]    Report Status 09/05/2019 FINAL  Final  Culture, blood (routine x 2)     Status: None   Collection Time: 08/31/19  1:23 PM   Specimen: BLOOD RIGHT HAND  Result Value Ref Range Status   Specimen Description BLOOD RIGHT HAND  Final   Special Requests   Final    BOTTLES DRAWN AEROBIC ONLY Blood Culture adequate volume   Culture   Final    NO GROWTH 5 DAYS Performed at [facility], [place] [address] [place], [place] [address]    Report Status 09/05/2019 FINAL  Final  MRSA PCR Screening     Status: Abnormal   Collection Time: 09/02/19  1:31 PM   Specimen: Nasal Mucosa; Nasopharyngeal  Result Value Ref Range Status   MRSA by PCR POSITIVE (A) NEGATIVE Final    Comment:        The GeneXpert MRSA Assay (FDA approved for NASAL specimens only), is one component of a comprehensive MRSA colonization surveillance program. It is not intended to diagnose MRSA infection nor to guide or monitor treatment for MRSA infections. RESULT CALLED TO, READ BACK BY AND VERIFIED WITH: RN Marguerita Beards [id number] Performed at [facility], [place] [address] [place], [place] [address]      Labs: Basic Metabolic Panel: Recent Labs  Lab 09/05/19 0302 09/05/19 0718 09/06/19 0305 09/06/19 0712 09/07/19 0435 09/08/19 0524  NA 138 138 138  --  139 139  K 5.9* 4.9 6.2* 5.6* 6.1* 5.4*  CL 106 108 109  --  108  110  CO2 16* 19* 17*  --  18* 18*  GLUCOSE 134* 135* 154*  --  177* 136*  BUN 94* 95* 95*  --  91* 82*  CREATININE 3.23* 3.03* 3.00*  --  3.08* 2.91*  CALCIUM 9.0 8.8* 8.9  --  7.4* 8.7*  PHOS  --  7.3*  --   --   --  6.2*   Liver Function Tests: Recent Labs  Lab 09/05/19 0718 09/08/19 0524  ALBUMIN 2.6* 2.5*   No results for input(s): LIPASE, AMYLASE in the last 168 hours. No results for input(s): AMMONIA in the last 168 hours. CBC: Recent Labs  Lab 09/02/19 0224 09/05/19 0302 09/07/19 0435  WBC  --   --  7.9  HGB 9.6* 10.0* 9.1*  HCT 30.8* 31.1* 29.6*  MCV  --   --  96.1  PLT  --   --  284   Cardiac Enzymes: Recent Labs  Lab 09/04/19 0541 09/08/19 0524  CKTOTAL 31* 77   BNP: BNP (last 3 results) No results for input(s): BNP in the last 8760 hours.  ProBNP (last 3 results) No results for input(s): PROBNP in the last 8760 hours.  CBG: Recent Labs  Lab 09/07/19 0800 09/07/19 1149 09/07/19 1702 09/07/19 2147 09/08/19 0753  GLUCAP 124* 158* 167* 186* 105*       Signed:  Nita Sells MD   [facility] 09/08/2019, 9:22 AM

## 2019-09-08 NOTE — Progress Notes (Signed)
PT Cancellation Note  Patient Details Name: Thomas [name] MRN: [id number] DOB: [birth year]   Cancelled Treatment:    Reason Eval/Treat Not Completed: (P) Pain limiting ability to participate(Pt refused to open his eyes and reports he is in too much pain to move.  PTA gave encouragement and education and he continues to decline.  I do not see how he is able to manage at home given he has not been out of bed this entire admission.)   Henli Hey J Stann Mainland 09/08/2019, 12:18 PM  [name] , PTA Acute Rehabilitation Services Pager [phone] Office [phone]

## 2019-09-08 NOTE — Progress Notes (Signed)
Pt's brother Shanon Brow didn't return phone call to [facility] regarding assisting pt with home IV ABX therapy. NCM re- attempted to speak with Shanon Brow via [employer] calls , however, calls were unsuccessful, no answer. Pt without caregiver to teach administration of home IV ABX therapy. Pt adamantly declines SNF placement. States" I'm not going to a hell hole !"  Safe d/c not in place. MD and nurse aware. NCM will continue to monitor and follow. Whitman Hero RN,BSN,CM [phone]

## 2019-09-09 LAB — GLUCOSE, CAPILLARY
Glucose-Capillary: 134 mg/dL — ABNORMAL HIGH (ref 70–99)
Glucose-Capillary: 184 mg/dL — ABNORMAL HIGH (ref 70–99)
Glucose-Capillary: 155 mg/dL — ABNORMAL HIGH (ref 70–99)
Glucose-Capillary: 190 mg/dL — ABNORMAL HIGH (ref 70–99)

## 2019-09-09 MED ORDER — FINASTERIDE 5 MG PO TABS
5.0000 mg | ORAL_TABLET | Freq: Every day | ORAL | Status: DC
  Administered 2019-09-09 – 2019-10-09 (×31): 5 mg via ORAL
  Filled 2019-09-09 (×31): qty 1

## 2019-09-09 MED ORDER — DAPTOMYCIN IV (FOR PTA / DISCHARGE USE ONLY): 1000.0000 mg | INTRAVENOUS | 0 refills | Status: DC

## 2019-09-09 NOTE — Progress Notes (Signed)
PT Cancellation Note  Patient Details Name: Thomas [name] MRN: [id number] DOB: September 05, 1951   Cancelled Treatment:    Reason Eval/Treat Not Completed: Pain limiting ability to participate. Patient reports 8/10 pain, declines even exercise. RN reports he did sit up on side of bed earlier today. Will re-attempt at another time.      Jeramey Lanuza 09/09/2019, 3:57 PM

## 2019-09-09 NOTE — Progress Notes (Signed)
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  Daptomycin/ceftriaxone Developed DVT right axillary vein, subsegmental PE right lung DM TY 2 CKD 3 anemia Admitted to [facility] WBC slightly up creatinine 3.7 up from 2.7 UA positive Right knee x-ray?  Irregularity?  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 Healing wound to left lower extremity Continue IV daptomycin until 10/26/2019 outpatient follow-up with ID--PICC line placed 11/16 to be removed subsequently Wound to left lower extremity needs to be followed by his podiatrist who is seen him in the past it looks clean continue Prevalon boot Patient has been to multiple pain clinics in the past and states that oxycodone "almost killed him" He states he can take Dilaudid however D/c Iv morphine-continue PO Oxycontin and PO dilaudid only  CKD 3-4, hyperkalemia, metabolic acidosis Rpt am labs as was hyperkalemic on 11/19 Continue IV saline 75 cc/h for now  Right axillary DVT 2019+ subsegmental PE right lung States was on anticoagulation for about 7 months and came off of it Outpatient follow-up  DM TY 2, Home Meds glipizide 10 twice daily CBGs ranging 134-201 eating 50 to 100% of meals Continue gabapentin 300 3 times daily D/c prednisone today  Gout Supposed to be on Pegloticase q. 14 days-outpatient follow-up  HTN Blood pressures suboptimally controlled 130s to  160s-continue amlodipine 10 clonidine 0.3 every 8, hydralazine increased to 50 every 8 from 25 and metoprolol 50 twice daily  BPH Continue Flomax  Anemia of iron deficiency Resume FeSO4 and vitamin B12  Subjective: Bad lbp night no distress Seems about stable  Consultants:   Infectious disease Procedures:   PICC 09/05/2019 Antimicrobials:   Daptomycin 08/31/19---End date 10/26/2019  Objective: Vitals:   09/08/19 0726 09/08/19 1220 09/08/19 2049 09/09/19 0527  BP: (!) 165/93 137/80 (!) 141/82 (!) 157/88  Pulse: 68 62 69 68  Resp:  15 15 16   Temp:  98.1 F (36.7 C) 98.1 F (36.7 C) 98.2 F (36.8 C)  TempSrc:  Oral Oral Oral  SpO2:  91% 99% 98%  Weight:    115.4 kg  Height:        Intake/Output Summary (Last 24 hours) at 09/09/2019 1044 Last data filed at 09/09/2019 [id number] Gross per 24 hour  Intake 480 ml  Output 2100 ml  Net -1620 ml   Filed Weights   09/06/19 0500 09/08/19 0424 09/09/19 0527  Weight: 113 kg 113.7 kg 115.4 kg    Examination: EOMI NCAT  Chest clinically clear no added sounds S1-S2 no murmur rub or gallop Abdomen soft nontender no rebound no guarding ROM intact Wounds not examined today  Data Reviewed: I have personally reviewed following labs and imaging studies  Scheduled Meds: . allopurinol  100 mg Oral BID  . amLODipine  10 mg Oral Daily  . Chlorhexidine Gluconate Cloth  6 each Topical Daily  . cloNIDine  0.3 mg Oral Q8H  . ferrous sulfate  325 mg Oral BID WC  . finasteride  5 mg Oral Daily  . gabapentin  300 mg Oral  TID  . heparin  5,000 Units Subcutaneous Q8H  . hydrALAZINE  50 mg Oral Q8H  . insulin aspart  0-9 Units Subcutaneous TID WC  . levothyroxine  50 mcg Oral Q0600  . metoprolol tartrate  50 mg Oral BID  . oxyCODONE  20 mg Oral Q8H  . predniSONE  10 mg Oral Q breakfast  . tamsulosin  0.4 mg Oral Daily  . vitamin B-12  2,500 mcg Oral Daily   Continuous Infusions: . sodium chloride 75 mL/hr at 09/09/19 0533  .  DAPTOmycin (CUBICIN)  IV 1,000 mg (09/08/19 2052)    Radiology Studies: Reviewed images personally in health database    LOS: 9 days   Time spent: [place], MD [facility]  09/09/2019, 10:44 AM

## 2019-09-10 LAB — CBC WITH DIFFERENTIAL/PLATELET
Abs Immature Granulocytes: 0.03 10*3/uL (ref 0.00–0.07)
Basophils Absolute: 0 10*3/uL (ref 0.0–0.1)
Basophils Relative: 0 %
Eosinophils Absolute: 0.2 10*3/uL (ref 0.0–0.5)
Eosinophils Relative: 2 %
HCT: 30.4 % — ABNORMAL LOW (ref 39.0–52.0)
Hemoglobin: 9.5 g/dL — ABNORMAL LOW (ref 13.0–17.0)
Immature Granulocytes: 0 %
Lymphocytes Relative: 18 %
Lymphs Abs: 1.4 10*3/uL (ref 0.7–4.0)
MCH: 29.8 pg (ref 26.0–34.0)
MCHC: 31.3 g/dL (ref 30.0–36.0)
MCV: 95.3 fL (ref 80.0–100.0)
Monocytes Absolute: 0.5 10*3/uL (ref 0.1–1.0)
Monocytes Relative: 6 %
Neutro Abs: 5.5 10*3/uL (ref 1.7–7.7)
Neutrophils Relative %: 74 %
Platelets: 270 10*3/uL (ref 150–400)
RBC: 3.19 MIL/uL — ABNORMAL LOW (ref 4.22–5.81)
RDW: 15 % (ref 11.5–15.5)
WBC: 7.5 10*3/uL (ref 4.0–10.5)
nRBC: 0 % (ref 0.0–0.2)

## 2019-09-10 LAB — RENAL FUNCTION PANEL
Albumin: 2.6 g/dL — ABNORMAL LOW (ref 3.5–5.0)
Anion gap: 10 (ref 5–15)
BUN: 64 mg/dL — ABNORMAL HIGH (ref 8–23)
CO2: 17 mmol/L — ABNORMAL LOW (ref 22–32)
Calcium: 8.7 mg/dL — ABNORMAL LOW (ref 8.9–10.3)
Chloride: 111 mmol/L (ref 98–111)
Creatinine, Ser: 2.92 mg/dL — ABNORMAL HIGH (ref 0.61–1.24)
GFR calc Af Amer: 25 mL/min — ABNORMAL LOW (ref >60)
GFR calc non Af Amer: 21 mL/min — ABNORMAL LOW (ref >60)
Glucose, Bld: 135 mg/dL — ABNORMAL HIGH (ref 70–99)
Phosphorus: 5.1 mg/dL — ABNORMAL HIGH (ref 2.5–4.6)
Potassium: 5.1 mmol/L (ref 3.5–5.1)
Sodium: 138 mmol/L (ref 135–145)

## 2019-09-10 LAB — GLUCOSE, CAPILLARY
Glucose-Capillary: 133 mg/dL — ABNORMAL HIGH (ref 70–99)
Glucose-Capillary: 168 mg/dL — ABNORMAL HIGH (ref 70–99)
Glucose-Capillary: 124 mg/dL — ABNORMAL HIGH (ref 70–99)
Glucose-Capillary: 159 mg/dL — ABNORMAL HIGH (ref 70–99)

## 2019-09-10 MED ORDER — SODIUM BICARBONATE 650 MG PO TABS
650.0000 mg | ORAL_TABLET | Freq: Every day | ORAL | Status: DC
  Administered 2019-09-10 – 2019-10-01 (×22): 650 mg via ORAL
  Filled 2019-09-10 (×23): qty 1

## 2019-09-10 NOTE — Progress Notes (Signed)
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  Daptomycin/ceftriaxone Developed DVT right axillary vein, subsegmental PE right lung DM TY 2 CKD 3 anemia Admitted to [facility] WBC slightly up creatinine 3.7 up from 2.7 UA positive Right knee x-ray?  Irregularity?  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 Healing wound to left lower extremity Continue IV daptomycin until 10/26/2019 outpatient follow-up with ID--PICC line placed 11/16 to be removed subsequently  followed by his podiatrist who is seen him in the past it looks clean continue Prevalon boot Patient has been to multiple pain clinics in the past and states that oxycodone "almost killed him" D/c Iv morphine-continue PO Oxycontin and PO dilaudid only  CKD 3-4, hyperkalemia, metabolic acidosis Rpt am labs as was hyperkalemic on 11/19--this is resolved Continue IV saline 75 cc/h for now  Right axillary DVT 2019+ subsegmental PE right lung States was on anticoagulation for about 7 months and came off of it Outpatient follow-up  DM TY 2, Home Meds glipizide 10 twice daily CBGs ranging 133-190 Continue gabapentin 300 3 times daily D/c prednisone  Gout Supposed to be on Pegloticase q. 14 days-outpatient follow-up  HTN Blood pressures suboptimally controlled 130s to 160s-continue amlodipine 10 clonidine 0.3 every 8, hydralazine increased to 50 every 8 from 25 and  metoprolol 50 twice daily  BPH Continue Flomax  Anemia of iron deficiency Resume FeSO4 and vitamin B12  Data Reviewed: K down from 6 to currently 5.1 Bun/creat down from 82/2.9-->64/2.9 Hemoglobin stable 9 range, WBC stable  Subjective: Claims significant pain but fast asleep when I saw him Didn't work with therapy yesterday In nad  eating some  Consultants:   Infectious disease Procedures:   PICC 09/05/2019 Antimicrobials:   Daptomycin 08/31/19---End date 10/26/2019  Objective: Vitals:   09/09/19 1253 09/09/19 2101 09/10/19 0506 09/10/19 0506  BP: (!) 158/98 (!) 160/97  (!) 167/86  Pulse: 71 79  63  Resp: 16 20    Temp: 98.3 F (36.8 C) 98.1 F (36.7 C)  98.3 F (36.8 C)  TempSrc: Oral Oral  Oral  SpO2: 97% 100%  98%  Weight:   113.5 kg   Height:        Intake/Output Summary (Last 24 hours) at 09/10/2019 1030 Last data filed at 09/10/2019 0900 Gross per 24 hour  Intake 4904.94 ml  Output 3175 ml  Net 1729.94 ml   Filed Weights   09/08/19 0424 09/09/19 0527 09/10/19 0506  Weight: 113.7 kg 115.4 kg 113.5 kg    Examination: EOMI NCAT  Chest clinically clear no added sounds S1-S2 no murmur rub or gallop Abdomen soft nontender no rebound no guarding ROM intact Wound Le looks clean removed dressing and compared to prior has improved   Scheduled Meds: . allopurinol  100 mg Oral BID  . amLODipine  10 mg Oral Daily  . Chlorhexidine Gluconate Cloth  6 each Topical Daily  . cloNIDine  0.3 mg Oral Q8H  . ferrous sulfate  325 mg Oral BID WC  . finasteride  5  mg Oral Daily  . gabapentin  300 mg Oral TID  . heparin  5,000 Units Subcutaneous Q8H  . hydrALAZINE  50 mg Oral Q8H  . insulin aspart  0-9 Units Subcutaneous TID WC  . levothyroxine  50 mcg Oral Q0600  . metoprolol tartrate  50 mg Oral BID  . oxyCODONE  20 mg Oral Q8H  . sodium bicarbonate  650 mg Oral Daily  . tamsulosin  0.4 mg Oral Daily  . vitamin B-12  2,500 mcg Oral Daily   Continuous  Infusions: . sodium chloride 75 mL/hr at 09/09/19 2228  . DAPTOmycin (CUBICIN)  IV 1,000 mg (09/09/19 2012)    Radiology Studies: Reviewed images personally in health database    LOS: 10 days   Time spent: [place], MD [facility]  09/10/2019, 10:30 AM

## 2019-09-11 LAB — GLUCOSE, CAPILLARY
Glucose-Capillary: 138 mg/dL — ABNORMAL HIGH (ref 70–99)
Glucose-Capillary: 173 mg/dL — ABNORMAL HIGH (ref 70–99)
Glucose-Capillary: 123 mg/dL — ABNORMAL HIGH (ref 70–99)
Glucose-Capillary: 149 mg/dL — ABNORMAL HIGH (ref 70–99)

## 2019-09-11 MED ORDER — SODIUM CHLORIDE 0.9 % IV SOLN
INTRAVENOUS | Status: DC | PRN
  Administered 2019-09-11 – 2019-09-12 (×2): 250 mL via INTRAVENOUS
  Administered 2019-09-16 – 2019-09-28 (×2): 1000 mL via INTRAVENOUS

## 2019-09-11 NOTE — Progress Notes (Signed)
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  Daptomycin/ceftriaxone Developed DVT right axillary vein, subsegmental PE right lung DM TY 2 CKD 3 anemia Admitted to [facility] WBC slightly up creatinine 3.7 up from 2.7 UA positive Right knee x-ray?  Irregularity?  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  CKD 3-4, hyperkalemia, metabolic acidosis Rpt am labs as was hyperkalemic on 11/19--this is resolved Saline 75/h d/c  Right axillary DVT 2019+ subsegmental PE right lung States was on anticoagulation for about 7 months and came off of it Outpatient follow-up  DM TY 2, Home Meds glipizide 10 twice daily CBGs ranging 133-190 Continue gabapentin 300 3 times daily D/c prednisone  Gout Supposed to be on Pegloticase q. 14 days-outpatient follow-up  HTN Blood pressures suboptimally controlled 130s to  160s-continue amlodipine 10 clonidine 0.3 every 8, hydralazine increased to 50 every 8 from 25 and metoprolol 50 twice daily  BPH Continue Flomax  Anemia of iron deficiency Resume FeSO4 and vitamin B12  Data Reviewed:   Subjective: Claims significant pain but fast asleep when I saw him No acute distress Tells me was going to go get the infusions as an OP in [place]  Consultants:   Infectious disease Procedures:   PICC 09/05/2019 Antimicrobials:   Daptomycin 08/31/19---End date 10/26/2019  Objective: Vitals:   09/10/19 1323 09/10/19 2120 09/11/19 0547 09/11/19 0700  BP: (!) 152/85 137/86 (!) 157/89 128/84  Pulse: 75 69 66 63  Resp: 17 18 18 17   Temp: (!) 97.4 F (36.3 C) 98.4 F (36.9 C) 98.4 F (36.9 C) 98.3 F (36.8 C)  TempSrc: Oral Oral Oral Oral  SpO2: 98% 97% 96% 96%  Weight:   112.7 kg   Height:        Intake/Output Summary (Last 24 hours) at 09/11/2019 1028 Last data filed at 09/11/2019 0915 Gross per 24 hour  Intake 2057.3 ml  Output 3475 ml  Net -1417.7 ml   Filed Weights   09/09/19 0527 09/10/19 0506 09/11/19 0547  Weight: 115.4 kg 113.5 kg 112.7 kg    Examination:  Awake coherent  eomi ncat abd soft nt nd no rebound ctab wound not exmained No focal deficit   Scheduled Meds: . allopurinol  100 mg Oral BID  . amLODipine  10 mg Oral Daily  . Chlorhexidine Gluconate Cloth  6 each Topical Daily  . cloNIDine  0.3 mg Oral Q8H  . ferrous sulfate  325 mg Oral BID WC  . finasteride  5 mg Oral Daily  .  gabapentin  300 mg Oral TID  . heparin  5,000 Units Subcutaneous Q8H  . hydrALAZINE  50 mg Oral Q8H  . insulin aspart  0-9 Units Subcutaneous TID WC  . levothyroxine  50 mcg Oral Q0600  . metoprolol tartrate  50 mg Oral BID  . oxyCODONE  20 mg Oral Q8H  . sodium bicarbonate  650 mg Oral Daily  . tamsulosin  0.4 mg Oral Daily  . vitamin B-12  2,500 mcg Oral Daily   Continuous Infusions: . sodium chloride 75 mL/hr at 09/11/19 0556  .  DAPTOmycin (CUBICIN)  IV 1,000 mg (09/10/19 2243)    Radiology Studies: Reviewed images personally in health database    LOS: 11 days   Time spent: [place], MD [facility]  09/11/2019, 10:28 AM

## 2019-09-11 NOTE — Progress Notes (Signed)
Pharmacy Antibiotic Note  Thomas [name] is a 68 y.o. male admitted on 08/31/2019 with MRSA bacteremia and discitis T10/11.  Pharmacy has been consulted for Daptomycin dosing.  ID: MRSA bacteremia and discitis T10/11. TTE shows no vegetation. ID recommended 8 wks daptomycin. WBC wnl, AF, Scr 2.91 - PICC 11/16  11/13 MRSA PCR: pos 11/11 Bcx2: negative 11/6 [place] BCx: MRSA   11/11: Dapto>> (10/26/19)  11/12: CK 187 11/15: CK 31 low 11/19: CK 77   Plan: Daptomycin 1000 mg Q 24hours (~ 8 mg/kg)  CK levels weekly q Thurs OPAT orders pended 11/13  Height: 6\' 6"  (198.1 cm) Weight: 248 lb 7.3 oz (112.7 kg) IBW/kg (Calculated) : 91.4  Temp (24hrs), Avg:98.1 F (36.7 C), Min:97.4 F (36.3 C), Max:98.4 F (36.9 C)  Recent Labs  Lab 09/05/19 0718 09/06/19 0305 09/07/19 0435 09/08/19 0524 09/10/19 0244  WBC  --   --  7.9  --  7.5  CREATININE 3.03* 3.00* 3.08* 2.91* 2.92*    Estimated Creatinine Clearance: 35.2 mL/min (A) (by C-G formula based on SCr of 2.92 mg/dL (H)).    Allergies  Allergen Reactions  . Other Other (See Comments)    Blood pressure issues Per [facility] hospital: Pt states he can only take these pain meds or else he gets very sick: Oxycontin,morphine,demerol, and dilaudid are the only pain meds pt states he can take.    Marland Kitchen Oxycodone Other (See Comments)    Cases extreme changes in Blood pressure Cases extreme changes in Blood pressure   . Oxycodone-Acetaminophen Other (See Comments)    Blood pressure issues, rash from acetaminophen, pt states he can take tylenol though.  . Oxymorphone Other (See Comments)    Causes kidney problems  . Aspirin Nausea And Vomiting    Per [facility]  . Beta Vulgaris Other (See Comments)  . Buspirone Other (See Comments)  . Cabbage Other (See Comments)  . Codeine Other (See Comments)  . Fish Allergy Other (See Comments)  . Fish-Derived Products   . Pentazocine   . Propoxyphene Other (See Comments)  . Shellfish  Allergy Other (See Comments)  . Shellfish-Derived Products   . Sulfa Antibiotics Hives  . Sulfasalazine Other (See Comments)  . Amoxicillin Rash    Per [place], PharmD  PGY1 Acute Care Pharmacy Resident [phone] 09/11/2019 9:06 AM

## 2019-09-12 LAB — RENAL FUNCTION PANEL
Albumin: 2.4 g/dL — ABNORMAL LOW (ref 3.5–5.0)
Anion gap: 9 (ref 5–15)
BUN: 53 mg/dL — ABNORMAL HIGH (ref 8–23)
CO2: 18 mmol/L — ABNORMAL LOW (ref 22–32)
Calcium: 8.6 mg/dL — ABNORMAL LOW (ref 8.9–10.3)
Chloride: 110 mmol/L (ref 98–111)
Creatinine, Ser: 2.91 mg/dL — ABNORMAL HIGH (ref 0.61–1.24)
GFR calc Af Amer: 25 mL/min — ABNORMAL LOW (ref >60)
GFR calc non Af Amer: 21 mL/min — ABNORMAL LOW (ref >60)
Glucose, Bld: 161 mg/dL — ABNORMAL HIGH (ref 70–99)
Phosphorus: 5.1 mg/dL — ABNORMAL HIGH (ref 2.5–4.6)
Potassium: 5.1 mmol/L (ref 3.5–5.1)
Sodium: 137 mmol/L (ref 135–145)

## 2019-09-12 LAB — CBC WITH DIFFERENTIAL/PLATELET
Abs Immature Granulocytes: 0.02 10*3/uL (ref 0.00–0.07)
Basophils Absolute: 0 10*3/uL (ref 0.0–0.1)
Basophils Relative: 1 %
Eosinophils Absolute: 0.2 10*3/uL (ref 0.0–0.5)
Eosinophils Relative: 4 %
HCT: 28.4 % — ABNORMAL LOW (ref 39.0–52.0)
Hemoglobin: 9 g/dL — ABNORMAL LOW (ref 13.0–17.0)
Immature Granulocytes: 0 %
Lymphocytes Relative: 28 %
Lymphs Abs: 1.5 10*3/uL (ref 0.7–4.0)
MCH: 29.5 pg (ref 26.0–34.0)
MCHC: 31.7 g/dL (ref 30.0–36.0)
MCV: 93.1 fL (ref 80.0–100.0)
Monocytes Absolute: 0.5 10*3/uL (ref 0.1–1.0)
Monocytes Relative: 10 %
Neutro Abs: 3.2 10*3/uL (ref 1.7–7.7)
Neutrophils Relative %: 57 %
Platelets: 252 10*3/uL (ref 150–400)
RBC: 3.05 MIL/uL — ABNORMAL LOW (ref 4.22–5.81)
RDW: 15.1 % (ref 11.5–15.5)
WBC: 5.5 10*3/uL (ref 4.0–10.5)
nRBC: 0 % (ref 0.0–0.2)

## 2019-09-12 LAB — GLUCOSE, CAPILLARY
Glucose-Capillary: 155 mg/dL — ABNORMAL HIGH (ref 70–99)
Glucose-Capillary: 162 mg/dL — ABNORMAL HIGH (ref 70–99)
Glucose-Capillary: 198 mg/dL — ABNORMAL HIGH (ref 70–99)
Glucose-Capillary: 121 mg/dL — ABNORMAL HIGH (ref 70–99)

## 2019-09-12 NOTE — Progress Notes (Signed)
Pt lives alone. Pt still without caregiver to assist and teach administration of home IV ABX therapy. NCM made an attempt to f/u with friend Ephrim by phone. However, call was unsuccessful... Voice message left. NCM called and spoke with brother Shanon Brow on speaker phone  while in pt's room to discuss d/c disposition. Pt remains adamant about not going to a SNF. Shanon Brow  stated he's unable to assist pt  and tried to encourage pt to go to SNF.  Pt with hx of R AKA , mobility limited, persistent c/o back  pain  ...needs assistance with just sitting on side of bed. States he wants to go home .... Teachable caregiver needs to be in place in order for pt to receive IV ABX @ home . TOC team will continue to monitor. Whitman Hero RN,BSN,CM

## 2019-09-12 NOTE — Progress Notes (Signed)
PT Cancellation Note  Patient Details Name: KENZEL AZUCENA MRN: [id number] DOB: 11-17-1950   Cancelled Treatment:    Reason Eval/Treat Not Completed: Patient declined, no reason specified- says he is hurting. RN states he received pain medicine 35 min prior. Patient states "I'm not moving." Due to continuous refusals on attempt to work with patient, PT will sign off at this time.    Angel Hobdy 09/12/2019, 1:42 PM

## 2019-09-12 NOTE — Progress Notes (Signed)
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  Daptomycin/ceftriaxone Developed DVT right axillary vein, subsegmental PE right lung DM TY 2 CKD 3 anemia Admitted to [facility] WBC slightly up creatinine 3.7 up from 2.7 UA positive Right knee x-ray?  Irregularity?  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 Healing wound to left lower extremity Continue IV daptomycin until 10/26/2019 outpatient follow-up with ID--PICC line placed 11/16 to be removed subsequently  followed by his podiatrist who is seen him in the past it looks clean continue Prevalon boot Patient has been to multiple pain clinics in the past and states that oxycodone "almost killed him" D/c Iv morphine-continue PO Oxycontin and PO dilaudid only No place to d/c for safe IV infusions-  Chronic pain syndrom Seems to be in more pain today from groin to neck-states sat up in bed and then felt severe pain Have encouraged more mobility--wouldn't increase meds right now--he is not having strnagury, dark urine etc  CKD 3-4, hyperkalemia, metabolic acidosis Rpt am labs as was hyperkalemic on 11/19--this is resolved Saline 75/h d/c periodic labs  Right axillary DVT 2019+ subsegmental PE right lung States was on anticoagulation for about 7 months and came off of it Outpatient follow-up  DM TY 2, Home Meds glipizide 10 twice daily CBGs ranging 120-150's Continue  gabapentin 300 3 times daily D/c prednisone  Gout Supposed to be on Pegloticase q. 14 days-outpatient follow-up  HTN Blood pressures suboptimally controlled 140s to 160s-continue amlodipine 10 clonidine 0.3 every 8, hydralazine increased to 50 every 8 from 25 and metoprolol 50 twice daily  BPH Continue Flomax  Anemia of iron deficiency Resume FeSO4 and vitamin B12 Needs periodic labs  Data Reviewed:   Subjective: Claims significant pain but fast asleep when I saw him No acute distress Tells me was going to go get the infusions as an OP in [place]  Consultants:   Infectious disease Procedures:   PICC 09/05/2019 Antimicrobials:   Daptomycin 08/31/19---End date 10/26/2019  Objective: Vitals:   09/11/19 0700 09/11/19 1342 09/11/19 2200 09/12/19 0527  BP: 128/84 127/78 (!) 164/86 140/87  Pulse: 63 67 70 65  Resp: 17 15 18 18   Temp: 98.3 F (36.8 C) 98.6 F (37 C) 98 F (36.7 C) 98 F (36.7 C)  TempSrc: Oral Oral Oral Oral  SpO2: 96% 98% 97% 96%  Weight:    114.6 kg  Height:        Intake/Output Summary (Last 24 hours) at 09/12/2019 1041 Last data filed at 09/12/2019 0700 Gross per 24 hour  Intake 877.15 ml  Output 1200 ml  Net -322.85 ml   Filed Weights   09/10/19 0506 09/11/19 0547 09/12/19 0527  Weight: 113.5 kg 112.7 kg 114.6 kg    Examination:  Awake coherent  Eyes closed during interview No distress abd soft nt nd no rebound no guard cta b Wound in prevalon not examined today  Scheduled Meds: . allopurinol  100 mg Oral BID  . amLODipine  10 mg Oral Daily  . Chlorhexidine  Gluconate Cloth  6 each Topical Daily  . cloNIDine  0.3 mg Oral Q8H  . ferrous sulfate  325 mg Oral BID WC  . finasteride  5 mg Oral Daily  . gabapentin  300 mg Oral TID  . heparin  5,000 Units Subcutaneous Q8H  . hydrALAZINE  50 mg Oral Q8H  . insulin aspart  0-9 Units Subcutaneous TID WC  . levothyroxine  50 mcg Oral Q0600  . metoprolol tartrate  50 mg Oral  BID  . oxyCODONE  20 mg Oral Q8H  . sodium bicarbonate  650 mg Oral Daily  . tamsulosin  0.4 mg Oral Daily  . vitamin B-12  2,500 mcg Oral Daily   Continuous Infusions: . sodium chloride 10 mL/hr at 09/12/19 0700  . DAPTOmycin (CUBICIN)  IV Stopped (09/11/19 2116)    Radiology Studies: Reviewed images personally in health database    LOS: 12 days   Time spent: [place], MD [facility]  09/12/2019, 10:41 AM

## 2019-09-12 NOTE — Plan of Care (Signed)

## 2019-09-12 NOTE — Progress Notes (Signed)
Patient refused drsg. change earlier d/t pain. Dilaudid PO given per PRN orders. An hour later, patient still refusing drsg. change d/t pain. Patient does not want moved at all. Oxycontin and Tizanidine given PO per orders. Will pas on in RN shift report.

## 2019-09-13 LAB — GLUCOSE, CAPILLARY
Glucose-Capillary: 161 mg/dL — ABNORMAL HIGH (ref 70–99)
Glucose-Capillary: 139 mg/dL — ABNORMAL HIGH (ref 70–99)
Glucose-Capillary: 149 mg/dL — ABNORMAL HIGH (ref 70–99)
Glucose-Capillary: 211 mg/dL — ABNORMAL HIGH (ref 70–99)

## 2019-09-13 NOTE — Progress Notes (Signed)
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  Daptomycin/ceftriaxone Developed DVT right axillary vein, subsegmental PE right lung DM TY 2 CKD 3 anemia Admitted to [facility] WBC slightly up creatinine 3.7 up from 2.7 UA positive Right knee x-ray?  Irregularity?  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 Healing wound to left lower extremity Continue IV daptomycin until 10/26/2019 outpatient follow-up with ID--PICC line placed 11/16 to be removed subsequently--Have asked ID to see if candidate for Oritavancin as dispo still pending and patient has no safe d/c plan  followed by his podiatrist --need OP follow up D/c Iv morphine-continue PO Oxycontin and PO dilaudid only No place to d/c for safe IV infusions-  Chronic pain syndrom Sencouraged more mobility--wouldn't increase meds right now  CKD 3-4, hyperkalemia, metabolic acidosis Rpt am labs as was hyperkalemic on 11/19--this is resolved Saline 75/h d/c periodic labs in am  Right axillary DVT 2019+ subsegmental PE right lung States was on anticoagulation for about 7 months and came off of it Outpatient follow-up  DM TY 2, Home Meds glipizide 10 twice daily CBGs ranging 139-161 Continue gabapentin 300 3 times daily D/c prednisone  Gout Supposed to be on Pegloticase q. 14 days-outpatient follow-up  HTN Blood pressures suboptimally controlled 140s to 160s-continue  amlodipine 10 clonidine 0.3 every 8, hydralazine increased to 50 every 8 from 25 and metoprolol 50 twice daily  BPH Continue Flomax  Anemia of iron deficiency Resume FeSO4 and vitamin B12 Needs periodic labs  Data Reviewed:   Subjective:  Fair no new real issues eating drinking Pain continues to be bothersome  Consultants:   Infectious disease Procedures:   PICC 09/05/2019 Antimicrobials:   Daptomycin 08/31/19---End date 10/26/2019  Objective: Vitals:   09/13/19 0500 09/13/19 0504 09/13/19 0826 09/13/19 1221  BP:  119/82 128/80 115/78  Pulse:  70 70 63  Resp:   20 19  Temp:  98.1 F (36.7 C) 98.1 F (36.7 C) 98.2 F (36.8 C)  TempSrc:  Oral Oral Oral  SpO2:  98% 100% 98%  Weight: 113.5 kg     Height:        Intake/Output Summary (Last 24 hours) at 09/13/2019 1525 Last data filed at 09/13/2019 0700 Gross per 24 hour  Intake 712.69 ml  Output 1350 ml  Net -637.31 ml   Filed Weights   09/11/19 0547 09/12/19 0527 09/13/19 0500  Weight: 112.7 kg 114.6 kg 113.5 kg    Examination:  Awake coherent no distress abd soft nt nd no rebound no guard cta b Wound in prevalon not examined today  Scheduled Meds: . allopurinol  100 mg Oral BID  . amLODipine  10 mg Oral Daily  . Chlorhexidine Gluconate Cloth  6 each Topical Daily  . cloNIDine  0.3 mg Oral Q8H  . ferrous sulfate  325 mg Oral BID WC  . finasteride  5 mg Oral Daily  . gabapentin  300 mg Oral TID  . heparin  5,000 Units Subcutaneous Q8H  . hydrALAZINE  50 mg Oral Q8H  .  insulin aspart  0-9 Units Subcutaneous TID WC  . levothyroxine  50 mcg Oral Q0600  . metoprolol tartrate  50 mg Oral BID  . oxyCODONE  20 mg Oral Q8H  . sodium bicarbonate  650 mg Oral Daily  . tamsulosin  0.4 mg Oral Daily  . vitamin B-12  2,500 mcg Oral Daily   Continuous Infusions: . sodium chloride 10 mL/hr at 09/13/19 0700  . DAPTOmycin (CUBICIN)  IV Stopped (09/12/19 2103)    Radiology Studies: Reviewed images  personally in health database    LOS: 13 days   Time spent: [place], MD [facility]  09/13/2019, 3:25 PM

## 2019-09-13 NOTE — Progress Notes (Signed)
[facility] for Infectious Disease  Date of Admission:  08/31/2019      Total days of antibiotics 17 Daptomycin            ASSESSMENT: Thomas [name] is a 68 y.o. 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 Dr. Verlon Au asked [place] to come back to rehash antibiotic plan; agree that we strongly worry about his success to make daily trips out of the house happen effectively. Will look into home health to see if weekly infusion of Dalbavancin vs Oritavancin can be attainable for him.   His back pain is going to be a confounder for daily transportation and we feel he will wind up failing due to missed appointments. While the recommended therapy would be for him to go to safe facility to administer IV medications (he and his family are unable/unwilling to support this at home) with daily use, he has no durable plan to make this effective    PLAN: 1. Will look into possibility for home long acting injectable antibiotic that home health can give  2. Would like to arrange Q2week Video Visit follow ups to ensure that he is improving given this is not conventional approach.   Further recommendations to come tomorrow - for now would continue daily daptomycin. Appreciate Dr. Verlon Au for calling back to create a safe, viable plan to treat his serious infection.    Principal Problem:   MRSA bacteremia Active Problems:   [name], right ([place])   [name], type 2) ([place])   HTN (hypertension)   Discitis   Pressure injury of skin   . allopurinol  100 mg Oral BID  . amLODipine  10 mg Oral Daily  . Chlorhexidine Gluconate Cloth  6 each Topical Daily  . cloNIDine  0.3 mg Oral Q8H  . ferrous sulfate  325 mg Oral BID WC  . finasteride  5 mg Oral Daily  . gabapentin  300 mg Oral TID  . heparin  5,000 Units Subcutaneous Q8H  . hydrALAZINE  50 mg Oral Q8H  . insulin aspart  0-9 Units Subcutaneous TID WC  . levothyroxine   50 mcg Oral Q0600  . metoprolol tartrate  50 mg Oral BID  . oxyCODONE  20 mg Oral Q8H  . sodium bicarbonate  650 mg Oral Daily  . tamsulosin  0.4 mg Oral Daily  . vitamin B-12  2,500 mcg Oral Daily    SUBJECTIVE: Complaining of back pain. He is refusing nursing home placement due to fear of contracting COVID. His plan is to consider his family to take him daily for infusion of dalbavancin. He has no back up plan if his family is unable to take him. Back pain is stable and about midway up thoracic space and has not migrated or worsened. Right heel ulcer is not draining and covered with dressing. No fevers/chills.   Review of Systems: Review of Systems  Constitutional: Negative for chills, fever, malaise/fatigue and weight loss.  Respiratory: Negative for cough and sputum production.   Cardiovascular: Negative for chest pain and leg swelling.  Gastrointestinal: Negative for abdominal pain, diarrhea and vomiting.  Genitourinary: Negative for dysuria and flank pain.  Musculoskeletal: Positive for back pain. Negative for joint pain, myalgias and neck pain.  Skin: Negative for rash.  Neurological: Negative for dizziness, tingling and headaches.  Psychiatric/Behavioral: Negative for depression and substance abuse. The patient is not nervous/anxious  and does not have insomnia.     Allergies  Allergen Reactions  . Other Other (See Comments)    Blood pressure issues Per [facility] hospital: Pt states he can only take these pain meds or else he gets very sick: Oxycontin,morphine,demerol, and dilaudid are the only pain meds pt states he can take.    Marland Kitchen Oxycodone Other (See Comments)    Cases extreme changes in Blood pressure Cases extreme changes in Blood pressure   . Oxycodone-Acetaminophen Other (See Comments)    Blood pressure issues, rash from acetaminophen, pt states he can take tylenol though.  . Oxymorphone Other (See Comments)    Causes kidney problems  . Aspirin Nausea And Vomiting     Per [facility]  . Beta Vulgaris Other (See Comments)  . Buspirone Other (See Comments)  . Cabbage Other (See Comments)  . Codeine Other (See Comments)  . Fish Allergy Other (See Comments)  . Fish-Derived Products   . Pentazocine   . Propoxyphene Other (See Comments)  . Shellfish Allergy Other (See Comments)  . Shellfish-Derived Products   . Sulfa Antibiotics Hives  . Sulfasalazine Other (See Comments)  . Amoxicillin Rash    Per [facility]    OBJECTIVE: Vitals:   09/13/19 0500 09/13/19 0504 09/13/19 0826 09/13/19 1221  BP:  119/82 128/80 115/78  Pulse:  70 70 63  Resp:   20 19  Temp:  98.1 F (36.7 C) 98.1 F (36.7 C) 98.2 F (36.8 C)  TempSrc:  Oral Oral Oral  SpO2:  98% 100% 98%  Weight: 113.5 kg     Height:       Body mass index is 28.92 kg/m.  Physical Exam Constitutional:      Appearance: Normal appearance.  HENT:     Mouth/Throat:     Mouth: Mucous membranes are moist.     Pharynx: Oropharynx is clear.  Eyes:     General: No scleral icterus.    Pupils: Pupils are equal, round, and reactive to light.  Cardiovascular:     Rate and Rhythm: Normal rate and regular rhythm.     Heart sounds: No murmur.  Pulmonary:     Effort: Pulmonary effort is normal.     Breath sounds: Normal breath sounds.  Abdominal:     General: Bowel sounds are normal. There is distension.     Tenderness: There is no abdominal tenderness.  Musculoskeletal: Normal range of motion.        General: Tenderness (thoracic spine) present. No swelling.     Comments: Calcaneal ulcer/fissure non draining, scabbed/healing   Skin:    General: Skin is warm and dry.     Capillary Refill: Capillary refill takes less than 2 seconds.  Neurological:     Mental Status: He is alert and oriented to person, place, and time.     Lab Results Lab Results  Component Value Date   WBC 5.5 09/12/2019   HGB 9.0 (L) 09/12/2019   HCT 28.4 (L) 09/12/2019   MCV 93.1 09/12/2019   PLT 252  09/12/2019    Lab Results  Component Value Date   CREATININE 2.91 (H) 09/12/2019   BUN 53 (H) 09/12/2019   NA 137 09/12/2019   K 5.1 09/12/2019   CL 110 09/12/2019   CO2 18 (L) 09/12/2019    Lab Results  Component Value Date   ALT 15 08/31/2019   AST 33 08/31/2019   ALKPHOS 102 08/31/2019   BILITOT 0.7 08/31/2019  Microbiology: No results found for this or any previous visit (from the past 240 hour(s)).   Janene Madeira, MSN, NP-C [facility] for Infectious Disease [email or web address] Pager: [phone] Office: [phone] [place]: [phone]

## 2019-09-13 NOTE — Progress Notes (Signed)
OT Cancellation Note  Patient Details Name: Thomas [name] MRN: [id number] DOB: August 21, 1951   Cancelled Treatment:     Coordinated with patient this AM to attempt therapy 45 mins after pain medication. OT arrive to patient's room with patient stating he hurt his back trying to stop a soda can from falling off his bedside table and now he is in too much pain. Patient also reporting he is trying to coordinate with doctor to discharge home, still refusing SNF. Due to multiple attempts of out of bed mobility with patient, OT will sign off at this time. Please re-consult if patient becomes agreeable to out of bed activity. Thank you.  [place] OT office: [place] 09/13/2019, 9:50 AM

## 2019-09-14 LAB — CBC WITH DIFFERENTIAL/PLATELET
Abs Immature Granulocytes: 0.03 10*3/uL (ref 0.00–0.07)
Basophils Absolute: 0 10*3/uL (ref 0.0–0.1)
Basophils Relative: 1 %
Eosinophils Absolute: 0.3 10*3/uL (ref 0.0–0.5)
Eosinophils Relative: 5 %
HCT: 26.9 % — ABNORMAL LOW (ref 39.0–52.0)
Hemoglobin: 8.8 g/dL — ABNORMAL LOW (ref 13.0–17.0)
Immature Granulocytes: 1 %
Lymphocytes Relative: 33 %
Lymphs Abs: 2 10*3/uL (ref 0.7–4.0)
MCH: 30.7 pg (ref 26.0–34.0)
MCHC: 32.7 g/dL (ref 30.0–36.0)
MCV: 93.7 fL (ref 80.0–100.0)
Monocytes Absolute: 0.5 10*3/uL (ref 0.1–1.0)
Monocytes Relative: 8 %
Neutro Abs: 3.1 10*3/uL (ref 1.7–7.7)
Neutrophils Relative %: 52 %
Platelets: 239 10*3/uL (ref 150–400)
RBC: 2.87 MIL/uL — ABNORMAL LOW (ref 4.22–5.81)
RDW: 15.1 % (ref 11.5–15.5)
WBC: 5.8 10*3/uL (ref 4.0–10.5)
nRBC: 0 % (ref 0.0–0.2)

## 2019-09-14 LAB — GLUCOSE, CAPILLARY
Glucose-Capillary: 221 mg/dL — ABNORMAL HIGH (ref 70–99)
Glucose-Capillary: 123 mg/dL — ABNORMAL HIGH (ref 70–99)
Glucose-Capillary: 166 mg/dL — ABNORMAL HIGH (ref 70–99)
Glucose-Capillary: 158 mg/dL — ABNORMAL HIGH (ref 70–99)

## 2019-09-14 LAB — RENAL FUNCTION PANEL
Albumin: 2.3 g/dL — ABNORMAL LOW (ref 3.5–5.0)
Anion gap: 10 (ref 5–15)
BUN: 54 mg/dL — ABNORMAL HIGH (ref 8–23)
CO2: 19 mmol/L — ABNORMAL LOW (ref 22–32)
Calcium: 8.6 mg/dL — ABNORMAL LOW (ref 8.9–10.3)
Chloride: 106 mmol/L (ref 98–111)
Creatinine, Ser: 2.88 mg/dL — ABNORMAL HIGH (ref 0.61–1.24)
GFR calc Af Amer: 25 mL/min — ABNORMAL LOW (ref >60)
GFR calc non Af Amer: 22 mL/min — ABNORMAL LOW (ref >60)
Glucose, Bld: 146 mg/dL — ABNORMAL HIGH (ref 70–99)
Phosphorus: 6 mg/dL — ABNORMAL HIGH (ref 2.5–4.6)
Potassium: 4.8 mmol/L (ref 3.5–5.1)
Sodium: 135 mmol/L (ref 135–145)

## 2019-09-14 LAB — C-REACTIVE PROTEIN: CRP: 1.8 mg/dL — ABNORMAL HIGH (ref <1.0)

## 2019-09-14 LAB — SEDIMENTATION RATE: Sed Rate: 132 mm/hr — ABNORMAL HIGH (ref 0–16)

## 2019-09-14 MED ORDER — HYDROMORPHONE HCL 2 MG PO TABS
2.0000 mg | ORAL_TABLET | ORAL | Status: DC | PRN
  Administered 2019-09-14 – 2019-10-09 (×129): 2 mg via ORAL
  Filled 2019-09-14 (×131): qty 1

## 2019-09-14 MED ORDER — DICLOFENAC EPOLAMINE 1.3 % EX PTCH
1.0000 | MEDICATED_PATCH | Freq: Two times a day (BID) | CUTANEOUS | Status: DC
  Administered 2019-09-14 – 2019-10-01 (×32): 1 via TRANSDERMAL
  Filled 2019-09-14 (×39): qty 1

## 2019-09-14 MED ORDER — SODIUM CHLORIDE 0.9% FLUSH
10.0000 mL | INTRAVENOUS | Status: DC | PRN
  Administered 2019-09-23 – 2019-10-05 (×2): 10 mL
  Filled 2019-09-14: qty 40

## 2019-09-14 MED ORDER — DALVANCE 500 MG IV SOLR: INTRAVENOUS | 0 refills | Status: DC

## 2019-09-14 MED ORDER — SORBITOL 70 % SOLN
15.0000 mL | Freq: Every day | Status: DC
  Administered 2019-09-14 – 2019-10-09 (×17): 15 mL via ORAL
  Filled 2019-09-14 (×16): qty 30

## 2019-09-14 NOTE — Progress Notes (Addendum)
[facility] for Infectious Disease  Date of Admission:  08/31/2019      Total days of antibiotics 18 Daptomycin            ASSESSMENT: CISCO KINDT is a 68 y.o. 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 Dr. Verlon Au asked [place] to come back to rethink antibiotic plan given concern over lack of family/transportation support for daily infusions.   He is approved for Dalbavancin to be given in the home Q7 days. Would arrange to have first of 6 doses given with Home Health the day after discharge. He has a visit scheduled with Terri Piedra, NP in [place] clinic 12/03. This can be converted to video visit for his transportation burden.   He does not necessarily need PICC line at D/C - Home Health team could perform new IV start weekly with infusion. I am not sure how he would otherwise perform the daily maintenance/flushing requirements to keep line patent. He would also need transportation back to IR for removal given tunneled line.   His back pain is going to be a confounder no matter where he goes; appears to be refusing most therapies here. Hopefully he can get an early appointment with his pain provider team to find something safe and helpful to allow him some mobility. Perhaps a back brace may offer him some support, however being thoracic infection may need ortho tech to give recommendation for brace he can tolerate.      PLAN: 1. IV OPAT orders as outlined below  2. Follow up arranged.  3. Will check CRP/ESR today for 2 week check prior to D/C   OPAT ORDERS:  Diagnosis: Vertebral Infection, bacteremia   Culture Result: MRSA   Allergies  Allergen Reactions  . Other Other (See Comments)    Blood pressure issues Per [facility] hospital: Pt states he can only take these pain meds or else he gets very sick: Oxycontin,morphine,demerol, and dilaudid are the only pain meds pt states he can take.    Marland Kitchen Oxycodone Other (See  Comments)    Cases extreme changes in Blood pressure Cases extreme changes in Blood pressure   . Oxycodone-Acetaminophen Other (See Comments)    Blood pressure issues, rash from acetaminophen, pt states he can take tylenol though.  . Oxymorphone Other (See Comments)    Causes kidney problems  . Aspirin Nausea And Vomiting    Per [facility]  . Beta Vulgaris Other (See Comments)  . Buspirone Other (See Comments)  . Cabbage Other (See Comments)  . Codeine Other (See Comments)  . Fish Allergy Other (See Comments)  . Fish-Derived Products   . Pentazocine   . Propoxyphene Other (See Comments)  . Shellfish Allergy Other (See Comments)  . Shellfish-Derived Products   . Sulfa Antibiotics Hives  . Sulfasalazine Other (See Comments)  . Amoxicillin Rash    Per [facility]    Discharge antibiotics: Dalbavancin 1500 mg x 1 for load then 500 mg Q7d x 5 doses  Duration: To complete 6 doses after hospital discharge  End Date: ~January 6th 2021  - specific date depends on infusion schedule when he is discharged.   [facility] Care and Maintenance Per Protocol __ Please pull PIC at completion of IV antibiotics _x_ Please leave PIC in place until doctor has seen patient or been notified  Labs weekly while on IV antibiotics: _x_ CBC with differential _x_ BMP __ BMP TWICE  WEEKLY** __ CMP _x_ CRP _x_ ESR __ Vancomycin trough  Fax weekly labs to [phone]  Clinic Follow Up Appt: 09/22/19 with Terri Piedra, NP     Principal Problem:   MRSA bacteremia Active Problems:   [name], right ([place])   [name], type 2) ([place])   HTN (hypertension)   Discitis   Pressure injury of skin   . allopurinol  100 mg Oral BID  . amLODipine  10 mg Oral Daily  . Chlorhexidine Gluconate Cloth  6 each Topical Daily  . cloNIDine  0.3 mg Oral Q8H  . ferrous sulfate  325 mg Oral BID WC  . finasteride  5 mg Oral Daily  . gabapentin  300 mg Oral TID  . heparin  5,000  Units Subcutaneous Q8H  . hydrALAZINE  50 mg Oral Q8H  . insulin aspart  0-9 Units Subcutaneous TID WC  . levothyroxine  50 mcg Oral Q0600  . metoprolol tartrate  50 mg Oral BID  . oxyCODONE  20 mg Oral Q8H  . sodium bicarbonate  650 mg Oral Daily  . sorbitol  15 mL Oral Daily  . tamsulosin  0.4 mg Oral Daily  . vitamin B-12  2,500 mcg Oral Daily    SUBJECTIVE: Complaining of back pain. He is refusing nursing home placement due to fear of contracting COVID. His plan is to consider his family to take him daily for infusion of dalbavancin. He has no back up plan if his family is unable to take him. Back pain is stable and about midway up thoracic space and has not migrated or worsened. Right heel ulcer is not draining and covered with dressing. No fevers/chills.   Review of Systems: Review of Systems  Constitutional: Negative for chills, fever, malaise/fatigue and weight loss.  Respiratory: Negative for cough and sputum production.   Cardiovascular: Negative for chest pain and leg swelling.  Gastrointestinal: Negative for abdominal pain, diarrhea and vomiting.  Genitourinary: Negative for dysuria and flank pain.  Musculoskeletal: Positive for back pain. Negative for joint pain, myalgias and neck pain.  Skin: Negative for rash.  Neurological: Negative for dizziness, tingling and headaches.  Psychiatric/Behavioral: Negative for depression and substance abuse. The patient is not nervous/anxious and does not have insomnia.     Allergies  Allergen Reactions  . Other Other (See Comments)    Blood pressure issues Per [facility] hospital: Pt states he can only take these pain meds or else he gets very sick: Oxycontin,morphine,demerol, and dilaudid are the only pain meds pt states he can take.    Marland Kitchen Oxycodone Other (See Comments)    Cases extreme changes in Blood pressure Cases extreme changes in Blood pressure   . Oxycodone-Acetaminophen Other (See Comments)    Blood pressure issues, rash  from acetaminophen, pt states he can take tylenol though.  . Oxymorphone Other (See Comments)    Causes kidney problems  . Aspirin Nausea And Vomiting    Per [facility]  . Beta Vulgaris Other (See Comments)  . Buspirone Other (See Comments)  . Cabbage Other (See Comments)  . Codeine Other (See Comments)  . Fish Allergy Other (See Comments)  . Fish-Derived Products   . Pentazocine   . Propoxyphene Other (See Comments)  . Shellfish Allergy Other (See Comments)  . Shellfish-Derived Products   . Sulfa Antibiotics Hives  . Sulfasalazine Other (See Comments)  . Amoxicillin Rash    Per [facility]    OBJECTIVE: Vitals:  09/13/19 2153 09/14/19 0332 09/14/19 0547 09/14/19 0556  BP:   133/80 133/80  Pulse: 65   (!) 58  Resp:    18  Temp:    98.3 F (36.8 C)  TempSrc:    Oral  SpO2:    99%  Weight:  113 kg    Height:       Body mass index is 28.79 kg/m.    Lab Results Lab Results  Component Value Date   WBC 5.8 09/14/2019   HGB 8.8 (L) 09/14/2019   HCT 26.9 (L) 09/14/2019   MCV 93.7 09/14/2019   PLT 239 09/14/2019    Lab Results  Component Value Date   CREATININE 2.88 (H) 09/14/2019   BUN 54 (H) 09/14/2019   NA 135 09/14/2019   K 4.8 09/14/2019   CL 106 09/14/2019   CO2 19 (L) 09/14/2019    Lab Results  Component Value Date   ALT 15 08/31/2019   AST 33 08/31/2019   ALKPHOS 102 08/31/2019   BILITOT 0.7 08/31/2019     Microbiology: No results found for this or any previous visit (from the past 240 hour(s)).   Janene Madeira, MSN, NP-C [facility] for Infectious Disease [email or web address] Pager: [phone] Office: [phone] [place]: [phone]

## 2019-09-14 NOTE — Progress Notes (Signed)
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  Daptomycin/ceftriaxone Developed DVT right axillary vein, subsegmental PE right lung DM TY 2 CKD 3 anemia Admitted to [facility] WBC slightly up creatinine 3.7 up from 2.7 UA positive Right knee x-ray?  Irregularity?  Infection T10-T11 concerning for discitis patient placed on daptomycin Patient was transferred to [facility] for MRI with sedation and TEE Assessment & Plan: T10-T11 discitis in a setting of MRSA bacteremia Healing wound to left lower extremity Continue IV daptomycin until 10/26/2019 outpatient follow-up with ID--PICC line placed 11/16 to be removed subsequently--Have asked ID to see if candidate for Oritavancin as dispo still pending and patient has no safe d/c plan  followed by his podiatrist --need OP follow up  Chronic pain syndrom D/c Iv morphine-continue PO Oxycontin-I have increased his dilaudid dose to 2 mg today in an effort to help him get OOB otherwise sure he will do well at home No place to d/c for safe IV infusions as yet and he is not ready to d/c until we can see he is somewhat mobile If his pain is no better will re-image in am  CKD 3-4, hyperkalemia, metabolic acidosis Rpt am labs as was hyperkalemic on 11/19--this is resolved now Saline 75/h d/c--somewhat improved  Right axillary DVT 2019+ subsegmental PE right lung States was on anticoagulation for about 7 months and came off of it Outpatient follow-up  DM TY 2, Home Meds glipizide 10 twice daily CBGs ranging 123-221 Continue gabapentin 300 3 times  daily D/c prednisone  Gout Supposed to be on Pegloticase q. 14 days-outpatient follow-up  HTN Blood pressures suboptimally controlled 140s to 160s-continue amlodipine 10 clonidine 0.3 every 8, hydralazine increased to 50 every 8 from 25 and metoprolol 50 twice daily  BPH Continue Flomax  Anemia of iron deficiency Resume FeSO4 and vitamin B12 Needs periodic labs  Data Reviewed:   Subjective:  Awake coherent but in pain "I cannot move"--hasnt moved he states since yesterday No fever    Consultants:   Infectious disease Procedures:   PICC 09/05/2019 Antimicrobials:   Daptomycin 08/31/19---End date 10/26/2019  Objective: Vitals:   09/14/19 0332 09/14/19 0547 09/14/19 0556 09/14/19 1335  BP:  133/80 133/80 (!) 144/82  Pulse:   (!) 58 62  Resp:   18 17  Temp:   98.3 F (36.8 C) 98.7 F (37.1 C)  TempSrc:   Oral Oral  SpO2:   99% 99%  Weight: 113 kg     Height:        Intake/Output Summary (Last 24 hours) at 09/14/2019 1502 Last data filed at 09/14/2019 0042 Gross per 24 hour  Intake 90.03 ml  Output 500 ml  Net -409.97 ml   Filed Weights   09/12/19 0527 09/13/19 0500 09/14/19 0332  Weight: 114.6 kg 113.5 kg 113 kg    Examination:  Awake coherent no distress abd soft nt nd no rebound no guard cta b Wound in prevalon not examined today He will not allow me to monitor his sacrum and states is in too much pain to move  Scheduled Meds: . allopurinol  100 mg Oral BID  . amLODipine  10 mg Oral  Daily  . Chlorhexidine Gluconate Cloth  6 each Topical Daily  . cloNIDine  0.3 mg Oral Q8H  . diclofenac  1 patch Transdermal BID  . ferrous sulfate  325 mg Oral BID WC  . finasteride  5 mg Oral Daily  . gabapentin  300 mg Oral TID  . heparin  5,000 Units Subcutaneous Q8H  . hydrALAZINE  50 mg Oral Q8H  . insulin aspart  0-9 Units Subcutaneous TID WC  . levothyroxine  50 mcg Oral Q0600  . metoprolol tartrate  50 mg Oral BID  . oxyCODONE  20 mg Oral Q8H  .  sodium bicarbonate  650 mg Oral Daily  . sorbitol  15 mL Oral Daily  . tamsulosin  0.4 mg Oral Daily  . vitamin B-12  2,500 mcg Oral Daily   Continuous Infusions: . sodium chloride 10 mL/hr at 09/13/19 0700  . DAPTOmycin (CUBICIN)  IV 1,000 mg (09/13/19 2141)    Radiology Studies: Reviewed images personally in health database    LOS: 14 days   Time spent: [place], MD [facility]  09/14/2019, 3:02 PM

## 2019-09-14 NOTE — Progress Notes (Signed)
PHARMACY CONSULT NOTE FOR:  OUTPATIENT  PARENTERAL ANTIBIOTIC THERAPY (OPAT)  Indication: MRSA vertebral infection, bacteremia Regimen: Dalbavancin 1500 mg X 1 then 500 mg Q 7 days X 5 doses End date: To be determined per infusion schedule with [place]   IV antibiotic discharge orders are pended. To discharging provider:  please sign these orders via discharge navigator,  Select New Orders & click on the button choice - Manage This Unsigned Work.     Thank you for allowing pharmacy to be a part of this patient's care.  Jimmy Footman, PharmD, BCPS, BCIDP Infectious Diseases Clinical Pharmacist Phone: [phone] 09/14/2019, 1:00 PM

## 2019-09-15 LAB — GLUCOSE, CAPILLARY
Glucose-Capillary: 154 mg/dL — ABNORMAL HIGH (ref 70–99)
Glucose-Capillary: 199 mg/dL — ABNORMAL HIGH (ref 70–99)
Glucose-Capillary: 148 mg/dL — ABNORMAL HIGH (ref 70–99)
Glucose-Capillary: 153 mg/dL — ABNORMAL HIGH (ref 70–99)

## 2019-09-15 LAB — CK: Total CK: 92 U/L (ref 49–397)

## 2019-09-15 NOTE — Progress Notes (Signed)
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  Daptomycin/ceftriaxone Developed DVT right axillary vein, subsegmental PE right lung DM TY 2 CKD 3 anemia Admitted to [facility] WBC slightly up creatinine 3.7 up from 2.7 UA positive Right knee x-ray?  Irregularity?  Infection T10-T11 concerning for discitis patient placed on daptomycin Patient was transferred to [facility] for MRI with sedation and TEE Assessment & Plan: T10-T11 discitis in a setting of MRSA bacteremia Healing wound to left lower extremity Continue IV daptomycin until 10/26/2019 outpatient follow-up with ID--PICC line placed 11/16 to be removed subsequently-- followed by his podiatrist --need OP follow up for wound on foot Wound reviewed 11/26 and looks very clean. Would hold on back imaging a tthis time as his pain is improved on PO MEds--if pain increases will need to consider pt CT imaging of back to rule out extension of abcesses  Chronic pain syndrome D/c Iv morphine-continue PO Oxycontin-increased 11/25 dilaudid dose to 2 mg today in an effort to help him get OOB otherwise sure he will do well at home as has no safe d/c plan, cannot really d/c the patient home although if he had someone to help him with mobility and transfers that may be a possibility he absolutely refuses care facility  CKD 3-4, hyperkalemia, metabolic acidosis Rpt am labs as was hyperkalemic on 11/19--this is resolved now Sal[place] 75/h d/c--somewhat improved Periodic labs  Right axillary DVT 2019+ subsegmental PE right lung States was on anticoagulation  for about 7 months and came off of it Outpatient follow-up  DM TY 2, Home Meds glipizide 10 twice daily CBGs ranging 150's Continue gabapentin 300 3 times daily D/c prednisone  Gout Supposed to be on Pegloticase q. 14 days-outpatient follow-up  HTN Blood pressures suboptimally controlled 140s to 160s-continue amlodipine 10 clonidine 0.3 every 8, hydralazine increased to 50 every 8 from 25 and metoprolol 50 twice daily   BPH Continue Flomax  Anemia of iron deficiency Resume FeSO4 and vitamin B12 Needs periodic labs  Data Reviewed:   Subjective:  Awake alert coherent no distress states pain is somewhat better was able to sit up for 1/2 hours yesterday no lower extremity edema no active issues had a large stool yesterday  Consultants:   Infectious disease Procedures:   PICC 09/05/2019 Antimicrobials:   Daptomycin 08/31/19---End date 10/26/2019  Objective: Vitals:   09/15/19 0507 09/15/19 0548 09/15/19 0614 09/15/19 0614  BP: 130/76 129/78 (!) 143/76 (!) 143/76  Pulse:  68 (!) 105 (!) 105  Resp:  18 16 16   Temp:  98.6 F (37 C) 99 F (37.2 C) 99 F (37.2 C)  TempSrc:  Oral Oral Oral  SpO2:  95% 96% 96%  Weight:  110.6 kg    Height:        Intake/Output Summary (Last 24 hours) at 09/15/2019 0915 Last data filed at 09/14/2019 1900 Gross per 24 hour  Intake 1200 ml  Output 400 ml  Net 800 ml   Filed Weights   09/14/19 0332 09/15/19 0335 09/15/19 0548  Weight: 113 kg 110.6 kg 110.6 kg    Examination:  Awake coherent no distress EOMI NCAT abd soft nt nd no rebound  no guard cta b Wounds on bottom of foot seem clean Patient turning somewhat in bed did not examine sacrum RRR Chest clinically clear no added sounds  Scheduled Meds: . allopurinol  100 mg Oral BID  . amLODipine  10 mg Oral Daily  . Chlorhexidine Gluconate Cloth  6 each Topical Daily  . cloNIDine  0.3 mg Oral Q8H  . diclofenac  1 patch Transdermal BID  . ferrous sulfate  325 mg Oral BID  WC  . finasteride  5 mg Oral Daily  . gabapentin  300 mg Oral TID  . heparin  5,000 Units Subcutaneous Q8H  . hydrALAZINE  50 mg Oral Q8H  . insulin aspart  0-9 Units Subcutaneous TID WC  . levothyroxine  50 mcg Oral Q0600  . metoprolol tartrate  50 mg Oral BID  . oxyCODONE  20 mg Oral Q8H  . sodium bicarbonate  650 mg Oral Daily  . sorbitol  15 mL Oral Daily  . tamsulosin  0.4 mg Oral Daily  . vitamin B-12  2,500 mcg Oral Daily   Continuous Infusions: . sodium chloride 10 mL/hr at 09/13/19 0700  . DAPTOmycin (CUBICIN)  IV 1,000 mg (09/14/19 2156)    Radiology Studies: Reviewed images personally in health database    LOS: 15 days   Time spent: [place], MD [facility]  09/15/2019, 9:15 AM

## 2019-09-15 NOTE — Progress Notes (Signed)
Pharmacy Antibiotic Note  Thomas [name] is a 68 y.o. male admitted on 08/31/2019 with MRSA bacteremia and discitis T10/11.  Pharmacy has been consulted for Daptomycin dosing.  Today, CK was normal at 92. Temp 99.   11/13 MRSA PCR: pos 11/11 Bcx2: negative 11/6 [place] BCx: MRSA   11/11: Dapto>> (10/26/19)   Plan: - Continue daptomycin 1000 mg Q 24hours (~8 mg/kg)  - CK levels weekly q Thurs - Will be switched to dalbavancin on discharge - New OPAT orders pended 11/25  Height: 6\' 6"  (198.1 cm) Weight: 243 lb 13.3 oz (110.6 kg) IBW/kg (Calculated) : 91.4  Temp (24hrs), Avg:98.5 F (36.9 C), Min:97.6 F (36.4 C), Max:99 F (37.2 C)  Recent Labs  Lab 09/10/19 0244 09/12/19 0209 09/14/19 0312  WBC 7.5 5.5 5.8  CREATININE 2.92* 2.91* 2.88*    Estimated Creatinine Clearance: 35.4 mL/min (A) (by C-G formula based on SCr of 2.88 mg/dL (H)).    Allergies  Allergen Reactions  . Other Other (See Comments)    Blood pressure issues Per [facility] hospital: Pt states he can only take these pain meds or else he gets very sick: Oxycontin,morphine,demerol, and dilaudid are the only pain meds pt states he can take.    Marland Kitchen Oxycodone Other (See Comments)    Cases extreme changes in Blood pressure Cases extreme changes in Blood pressure   . Oxycodone-Acetaminophen Other (See Comments)    Blood pressure issues, rash from acetaminophen, pt states he can take tylenol though.  . Oxymorphone Other (See Comments)    Causes kidney problems  . Aspirin Nausea And Vomiting    Per [facility]  . Beta Vulgaris Other (See Comments)  . Buspirone Other (See Comments)  . Cabbage Other (See Comments)  . Codeine Other (See Comments)  . Fish Allergy Other (See Comments)  . Fish-Derived Products   . Pentazocine   . Propoxyphene Other (See Comments)  . Shellfish Allergy Other (See Comments)  . Shellfish-Derived Products   . Sulfa Antibiotics Hives  . Sulfasalazine Other (See Comments)  .  Amoxicillin Rash    Per [facility]    Agnes Lawrence, PharmD PGY1 Pharmacy Resident

## 2019-09-16 LAB — GLUCOSE, CAPILLARY
Glucose-Capillary: 155 mg/dL — ABNORMAL HIGH (ref 70–99)
Glucose-Capillary: 166 mg/dL — ABNORMAL HIGH (ref 70–99)
Glucose-Capillary: 152 mg/dL — ABNORMAL HIGH (ref 70–99)
Glucose-Capillary: 133 mg/dL — ABNORMAL HIGH (ref 70–99)

## 2019-09-16 NOTE — Progress Notes (Signed)
PROGRESS NOTE    Thomas [name]  [id number] DOB: Aug 30, 1951 DOA: 08/31/2019 PCP: Antonietta Jewel, MD   Brief Narrative: Thomas [name] is a 68 y.o. malewithhistory of diabetes mellitus type 2, hypertension, chronic kidney disease stage III, anemia who was admitted at [facility] after having fever chills and back pain. 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    Assessment & Plan:   Principal Problem:   MRSA bacteremia Active Problems:   [name], right ([place])   [name], type 2) ([place])   HTN (hypertension)   Discitis   Pressure injury of skin   T10-T11 discitis MRSA bacteremia Left lower extremity wound In setting of chronic left foot wound. Patient treated with IV daptomycin. Transthoracic Echocardiogram at OSH significant for no vegetation. Recommendation for Transesophageal Echocardiogram at 4-6 weeks as an outpatient. -ID recommendations: Daptomycin while inpatient; recommendation for Dalbavancin on discharge  Chronic pain syndrome Patient follows with a pain clinic. He is on Oxycontin and Neurontin as an outpatient. Started on hydromorphone impatient. -Continue Oxycontin, gabapentin,   Diabetes mellitus, type 2 On glipizide as an outpatient -Continue SSI  AKI on CKD stage IV Baseline creatinine of about 2.7. Peak creatinine of 3.23. currently back to baseline.  Hypothyroidism -Continue Synthroid  Essential hypertension -Continue amlodipine, clonidine, hydralazine, metoprolol  Hyperkalemia Likely secondary to AKI. Given Lokelma. Resolved.  Anemia of chronic disease Baseline creatinine of about 9 to 10. Stable.  Back pain Acute on chronic. Management above  Hiccups Resolved.  History of BKA Stable.  Pressure injury Left heel/foot, POA   DVT prophylaxis: Heparin Code Status:   Code Status: Full Code Family Communication: None at bedside Disposition Plan: Discharge likely in 3 days pending PT/OT  recommendations for home in addition to discussion with outpatient pain medicine physician   Consultants:   Infectious disease  Procedures:   None  Antimicrobials:  Daptomycin    Subjective: Pain improved today  Objective: Vitals:   09/15/19 1453 09/15/19 2037 09/16/19 0438 09/16/19 1036  BP: 138/71 129/78 126/83   Pulse: 81 70 (!) 57 63  Resp: 18 18 18    Temp: 98.9 F (37.2 C) 98.5 F (36.9 C) 98.2 F (36.8 C)   TempSrc: Oral Oral Oral   SpO2: 98% 97% 96%   Weight:      Height:        Intake/Output Summary (Last 24 hours) at 09/16/2019 1454 Last data filed at 09/16/2019 1024 Gross per 24 hour  Intake 240 ml  Output 700 ml  Net -460 ml   Filed Weights   09/14/19 0332 09/15/19 0335 09/15/19 0548  Weight: 113 kg 110.6 kg 110.6 kg    Examination:  General exam: Appears calm and comfortable Respiratory system: Clear to auscultation. Respiratory effort normal. Cardiovascular system: S1 & S2 heard, RRR. No murmurs, rubs, gallops or clicks. Gastrointestinal system: Abdomen is nondistended, soft and nontender. No organomegaly or masses felt. Normal bowel sounds heard. Central nervous system: Alert and oriented. No focal neurological deficits. Extremities: No edema. No calf tenderness. Right BKA Skin: No cyanosis. No rashes Psychiatry: Judgement and insight appear normal. Mood & affect appropriate.     Data Reviewed: I have personally reviewed following labs and imaging studies  CBC: Recent Labs  Lab 09/10/19 0244 09/12/19 0209 09/14/19 0312  WBC 7.5 5.5 5.8  NEUTROABS 5.5 3.2 3.1  HGB 9.5* 9.0* 8.8*  HCT 30.4* 28.4* 26.9*  MCV 95.3 93.1 93.7  PLT 270 252  [id number]   Basic Metabolic Panel: Recent Labs  Lab 09/10/19 0244 09/12/19 0209 09/14/19 0312  NA 138 137 135  K 5.1 5.1 4.8  CL 111 110 106  CO2 17* 18* 19*  GLUCOSE 135* 161* 146*  BUN 64* 53* 54*  CREATININE 2.92* 2.91* 2.88*  CALCIUM 8.7* 8.6* 8.6*  PHOS 5.1* 5.1* 6.0*   GFR: Estimated  Creatinine Clearance: 35.4 mL/min (A) (by C-G formula based on SCr of 2.88 mg/dL (H)). Liver Function Tests: Recent Labs  Lab 09/10/19 0244 09/12/19 0209 09/14/19 0312  ALBUMIN 2.6* 2.4* 2.3*   No results for input(s): LIPASE, AMYLASE in the last 168 hours. No results for input(s): AMMONIA in the last 168 hours. Coagulation Profile: No results for input(s): INR, PROTIME in the last 168 hours. Cardiac Enzymes: Recent Labs  Lab 09/15/19 0413  CKTOTAL 92   BNP (last 3 results) No results for input(s): PROBNP in the last 8760 hours. HbA1C: No results for input(s): HGBA1C in the last 72 hours. CBG: Recent Labs  Lab 09/15/19 1137 09/15/19 1659 09/15/19 2154 09/16/19 0802 09/16/19 1225  GLUCAP 199* 148* 153* 155* 166*   Lipid Profile: No results for input(s): CHOL, HDL, LDLCALC, TRIG, CHOLHDL, LDLDIRECT in the last 72 hours. Thyroid Function Tests: No results for input(s): TSH, T4TOTAL, FREET4, T3FREE, THYROIDAB in the last 72 hours. Anemia Panel: No results for input(s): VITAMINB12, FOLATE, FERRITIN, TIBC, IRON, RETICCTPCT in the last 72 hours. Sepsis Labs: No results for input(s): PROCALCITON, LATICACIDVEN in the last 168 hours.  No results found for this or any previous visit (from the past 240 hour(s)).       Radiology Studies: No results found.      Scheduled Meds: . allopurinol  100 mg Oral BID  . amLODipine  10 mg Oral Daily  . Chlorhexidine Gluconate Cloth  6 each Topical Daily  . cloNIDine  0.3 mg Oral Q8H  . diclofenac  1 patch Transdermal BID  . ferrous sulfate  325 mg Oral BID WC  . finasteride  5 mg Oral Daily  . gabapentin  300 mg Oral TID  . heparin  5,000 Units Subcutaneous Q8H  . hydrALAZINE  50 mg Oral Q8H  . insulin aspart  0-9 Units Subcutaneous TID WC  . levothyroxine  50 mcg Oral Q0600  . metoprolol tartrate  50 mg Oral BID  . oxyCODONE  20 mg Oral Q8H  . sodium bicarbonate  650 mg Oral Daily  . sorbitol  15 mL Oral Daily  .  tamsulosin  0.4 mg Oral Daily  . vitamin B-12  2,500 mcg Oral Daily   Continuous Infusions: . sodium chloride 10 mL/hr at 09/13/19 0700  . DAPTOmycin (CUBICIN)  IV 1,000 mg (09/15/19 2144)     LOS: 16 days     Cordelia Poche, MD [facility] 09/16/2019, 2:54 PM  If 7PM-7AM, please contact night-coverage [email or web address]

## 2019-09-16 NOTE — Evaluation (Signed)
Physical Therapy Evaluation Patient Details Name: Thomas [name] MRN: [id number] DOB: 03/14/1951 Today's Date: 09/16/2019   History of Present Illness  Patient is a 68 year old male history of diabetes mellitus type 2, hypertension, chronic kidney disease stage III, anemia, R BKA Sept 2019, who was admitted at [facility] after having fever chills and back pain. Patient transferred from [facility] for MRSA bacteremia. CT abdomen showing discitis involvling T10, T11 area.  Clinical Impression  Patient previously on PT service and discontinued due to not participating.  Continues to decline to mobilize when he is in pain despite encouragement and return visit.  He did finally attempt at my insistence, but quickly returned to supine due to muscle spasms and pain.  Do not see him able to return home alone at this point.  Feel he will need SNF level rehab at d/c if he will agree.  Currently still feels he can go home next week.  PT to follow acutely and try to maximize participation with pre-medication.      Follow Up Recommendations SNF    Equipment Recommendations  None recommended by PT    Recommendations for Other Services       Precautions / Restrictions Precautions Precautions: Fall Precaution Comments: contact precautions MRSA      Mobility  Bed Mobility Overal bed mobility: Needs Assistance   Rolling: Modified independent (Device/Increase time) Sidelying to sit: Min assist     Sit to sidelying: Supervision General bed mobility comments: rolled to L and sat up with min A for balance/safety, but then threw himself back into supine due to back spasms.  Transfers                 General transfer comment: NT  Ambulation/Gait                Stairs            Wheelchair Mobility    Modified Rankin (Stroke Patients Only)       Balance Overall balance assessment: Needs assistance Sitting-balance support: Feet supported Sitting  balance-[name]: Poor Sitting balance - Comments: did not stay upright to demonstrate balance, returned quickly to supine with c/o pain       Standing balance comment: NT                             Pertinent Vitals/Pain Pain Assessment: Faces Faces Pain Scale: Hurts whole lot Pain Location: back and L groin spasms with attempts to mobilize Pain Descriptors / Indicators: Grimacing;Guarding;Moaning Pain Intervention(s): Monitored during session;Repositioned;Limited activity within patient's tolerance;RN gave pain meds during session    Home Living Family/patient expects to be discharged to:: Skilled nursing facility Living Arrangements: Alone Available Help at Discharge: Neighbor;Available PRN/intermittently Type of Home: House Home Access: Stairs to enter   [employer] of Steps: 1 Home Layout: One level Home Equipment: Cane - single point;Crutches Additional Comments: Patient reports floors are not completely level, and that carpeting will not allow him to use wheelchair or walker.    Prior Function Level of Independence: Independent with assistive device(s)         Comments: patient reports ambulating with a cane and a crutch for 3 months. Reports just received R LE prosthesis prior to admission     Hand Dominance        Extremity/Trunk Assessment   Upper Extremity Assessment Upper Extremity Assessment: Overall WFL for tasks assessed  Lower Extremity Assessment Lower Extremity Assessment: RLE deficits/detail;LLE deficits/detail RLE Deficits / Details: moves leg on his own, but attempts at stretching increase muscle spasms in back RLE Sensation: decreased light touch LLE Deficits / Details: limited by pain, note wearing prevolon boot on foot; lifts antigravity with pain in back    Cervical / Trunk Assessment Cervical / Trunk Assessment: Other exceptions Cervical / Trunk Exceptions: reports cannot bend from shoulder to hip due to  spasms  Communication   Communication: No difficulties  Cognition Arousal/Alertness: Awake/alert Behavior During Therapy: Agitated Overall Cognitive Status: No family/caregiver present to determine baseline cognitive functioning                                 General Comments: frustrated with my lack of understanding that he cannot get up when he has spasms, but denies need for rehab, wants to go home despite not able to get up to EOB      General Comments      Exercises     Assessment/Plan    PT Assessment Patient needs continued PT services  PT Problem List Decreased strength;Decreased activity tolerance;Decreased mobility;Decreased knowledge of use of DME;Pain       PT Treatment Interventions Functional mobility training;Therapeutic exercise;Patient/family education;Balance training;Therapeutic activities;DME instruction;Wheelchair mobility training    PT Goals (Current goals can be found in the Care Plan section)  Acute Rehab PT Goals Patient Stated Goal: to move when pain controlled PT Goal Formulation: With patient Time For Goal Achievement: 09/30/19 Potential to Achieve Goals: Fair    Frequency Min 2X/week   Barriers to discharge Decreased caregiver support lives alone    Co-evaluation               AM-PAC PT "6 Clicks" Mobility  Outcome Measure Help needed turning from your back to your side while in a flat bed without using bedrails?: A Little Help needed moving from lying on your back to sitting on the side of a flat bed without using bedrails?: A Little Help needed moving to and from a bed to a chair (including a wheelchair)?: A Lot Help needed standing up from a chair using your arms (e.g., wheelchair or bedside chair)?: A Lot Help needed to walk in hospital room?: Total Help needed climbing 3-5 steps with a railing? : Total 6 Click Score: 12    End of Session   Activity Tolerance: Patient limited by pain Patient left: in bed;with  nursing/sitter in room Nurse Communication: Mobility status PT Visit Diagnosis: Muscle weakness (generalized) (M62.81);Difficulty in walking, not elsewhere classified (R26.2);Pain Pain - Right/Left: Right Pain - part of body: Hip(and back)    Time: [id number] PT Time Calculation (min) (ACUTE ONLY): 15 min   Charges:   PT Evaluation $PT Eval Moderate Complexity: [place], [place] Acute Rehabilitation Services [phone] 09/16/2019   [name] 09/16/2019, 3:48 PM

## 2019-09-17 ENCOUNTER — Inpatient Hospital Stay (HOSPITAL_COMMUNITY): Payer: Medicare Other

## 2019-09-17 LAB — CBC
HCT: 27.5 % — ABNORMAL LOW (ref 39.0–52.0)
Hemoglobin: 8.9 g/dL — ABNORMAL LOW (ref 13.0–17.0)
MCH: 30.2 pg (ref 26.0–34.0)
MCHC: 32.4 g/dL (ref 30.0–36.0)
MCV: 93.2 fL (ref 80.0–100.0)
Platelets: 205 10*3/uL (ref 150–400)
RBC: 2.95 MIL/uL — ABNORMAL LOW (ref 4.22–5.81)
RDW: 15 % (ref 11.5–15.5)
WBC: 5.8 10*3/uL (ref 4.0–10.5)
nRBC: 0 % (ref 0.0–0.2)

## 2019-09-17 LAB — BASIC METABOLIC PANEL
Anion gap: 8 (ref 5–15)
BUN: 53 mg/dL — ABNORMAL HIGH (ref 8–23)
CO2: 23 mmol/L (ref 22–32)
Calcium: 9.1 mg/dL (ref 8.9–10.3)
Chloride: 106 mmol/L (ref 98–111)
Creatinine, Ser: 2.99 mg/dL — ABNORMAL HIGH (ref 0.61–1.24)
GFR calc Af Amer: 24 mL/min — ABNORMAL LOW (ref >60)
GFR calc non Af Amer: 21 mL/min — ABNORMAL LOW (ref >60)
Glucose, Bld: 138 mg/dL — ABNORMAL HIGH (ref 70–99)
Potassium: 5.1 mmol/L (ref 3.5–5.1)
Sodium: 137 mmol/L (ref 135–145)

## 2019-09-17 LAB — GLUCOSE, CAPILLARY
Glucose-Capillary: 139 mg/dL — ABNORMAL HIGH (ref 70–99)
Glucose-Capillary: 170 mg/dL — ABNORMAL HIGH (ref 70–99)
Glucose-Capillary: 189 mg/dL — ABNORMAL HIGH (ref 70–99)
Glucose-Capillary: 138 mg/dL — ABNORMAL HIGH (ref 70–99)

## 2019-09-17 IMAGING — CT CT T SPINE W/O CM
3 series · 10 of 33 positions shown, 11 images · non-contrast
Comparison: CTA chest [DATE]. CT abdomen and pelvis [DATE].

CLINICAL DATA: Bacteremia.

EXAM:
CT THORACIC AND LUMBAR SPINE WITHOUT CONTRAST
TECHNIQUE: Multidetector CT imaging of the thoracic and lumbar spine was
performed without contrast. Multiplanar CT image reconstructions
were also generated.

[Series 5: t-spine 2.0 st · axial · 0.38mm/px · z∈[-562,-390]mm · 2 of 187 slices shown, 3 images]
[im 58/187  soft-tissue]
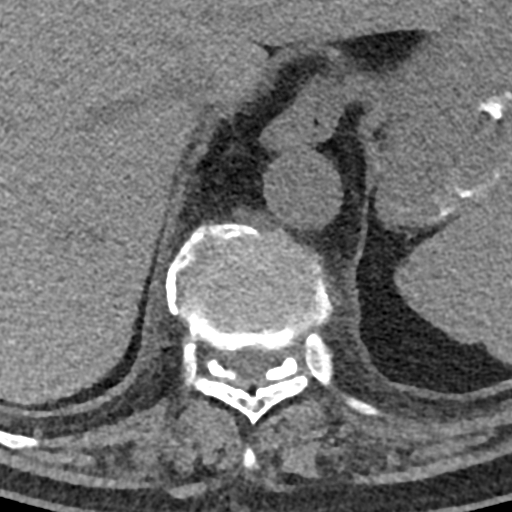
[im 58/187  bone]
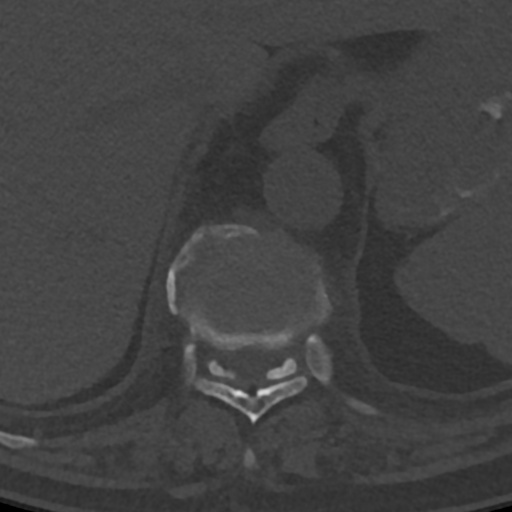
[im 144/187  bone]
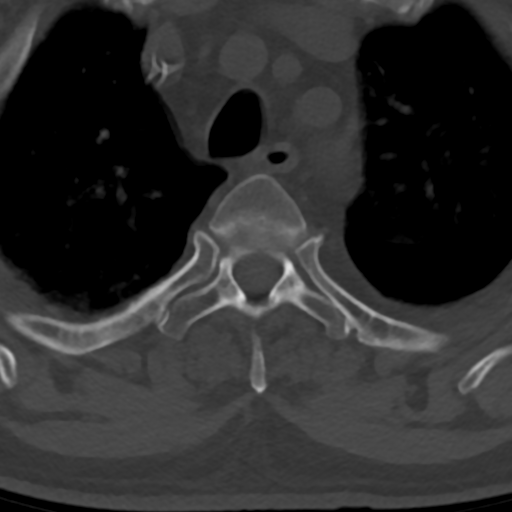

[Series 11: t-spine 2.0 cor · coronal · 0.38mm/px · 3 of 87 slices shown]
[im 18/87  bone]
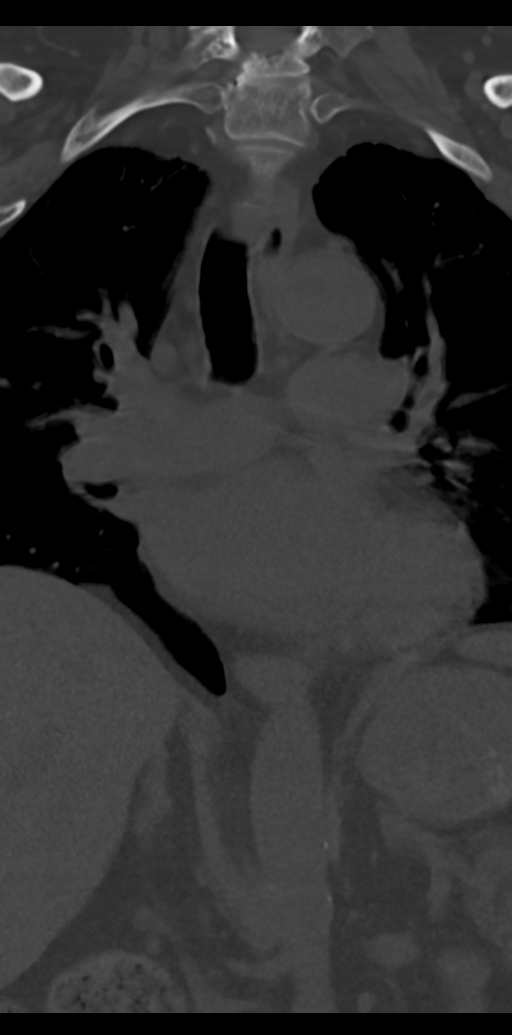
[im 35/87  bone]
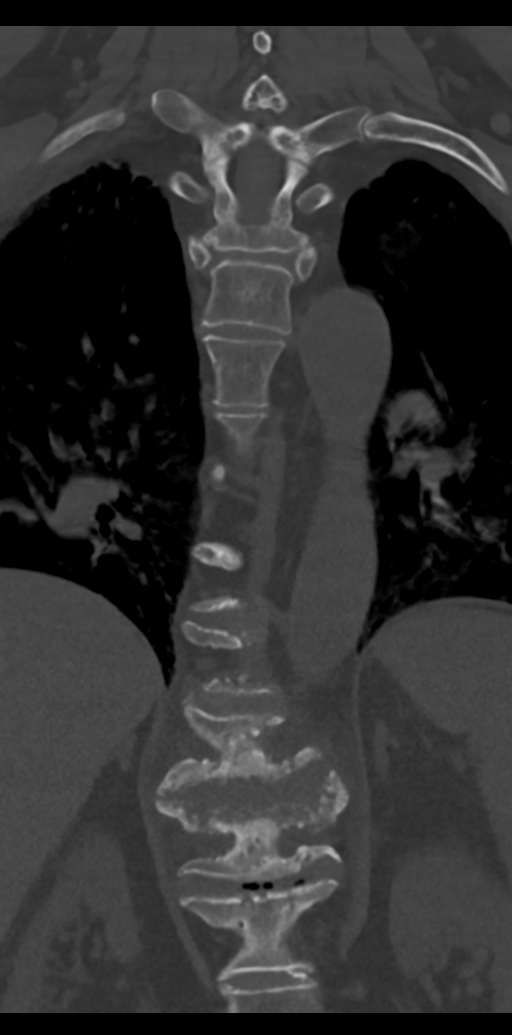
[im 52/87  bone]
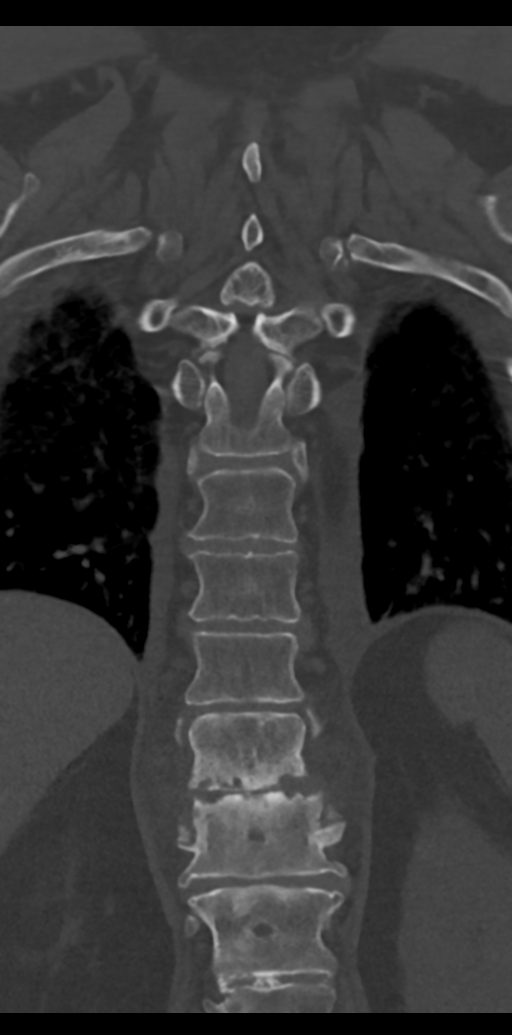

[Series 12: t-spine 2.0 sag · sagittal · 0.38mm/px · 5 of 61 slices shown]
[im 21/61  bone]
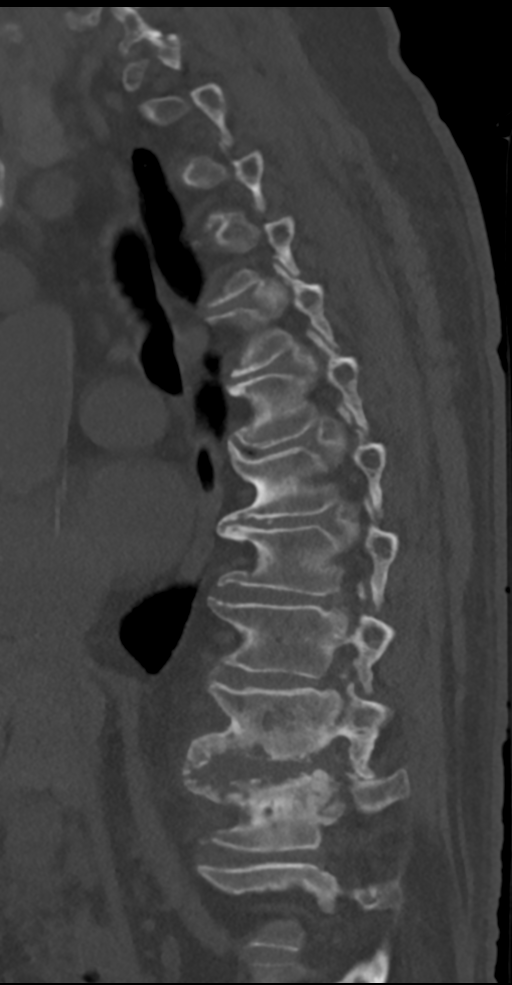
[im 26/61  bone]
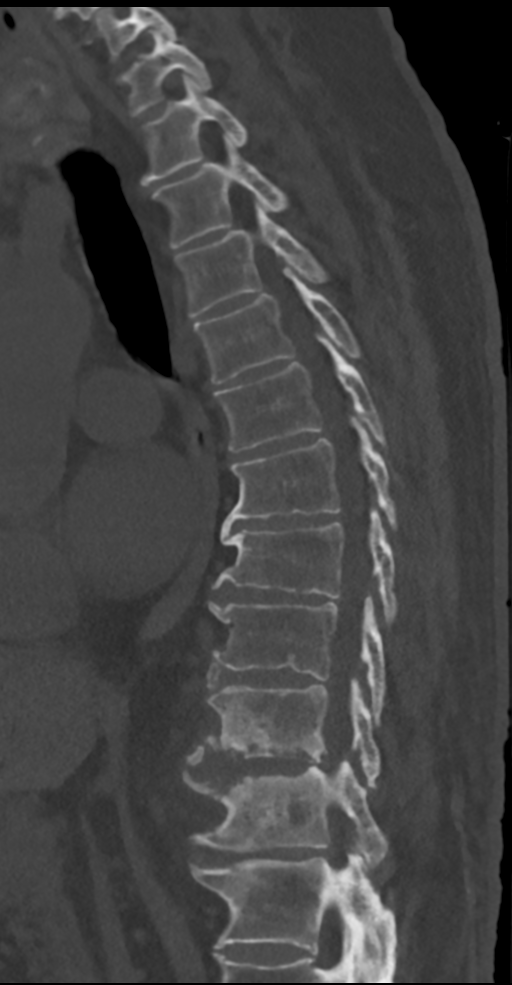
[im 31/61  bone]
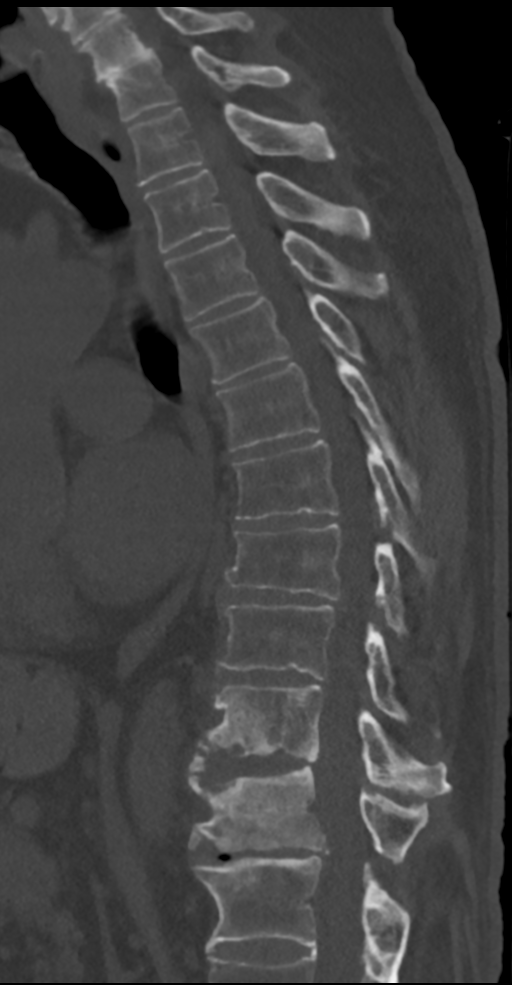
[im 36/61  bone]
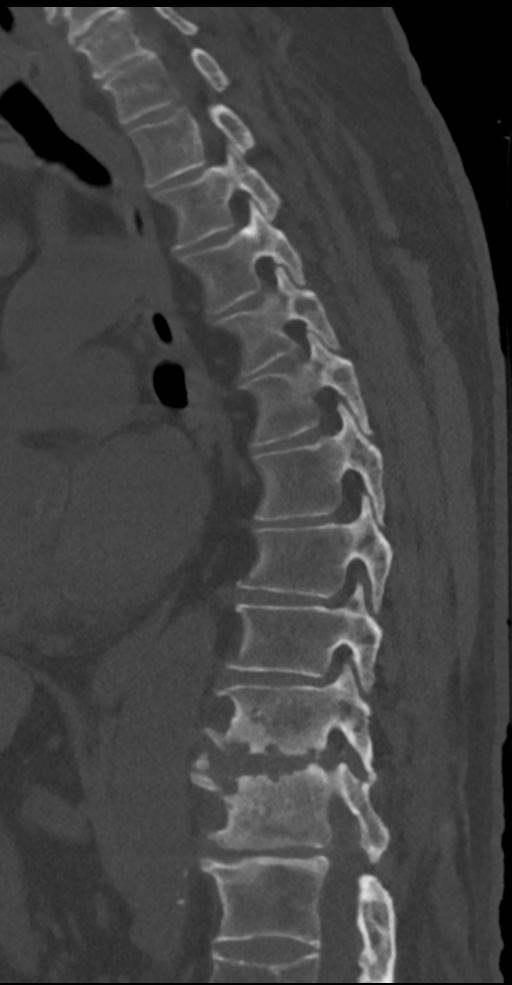
[im 41/61  bone]
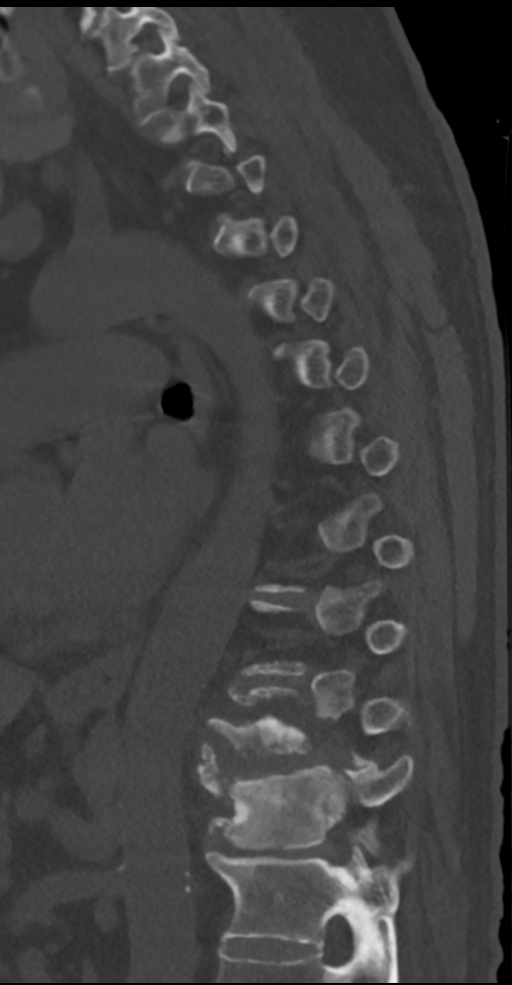

[10 of 33 positions shown; findings below may reference images not displayed]

FINDINGS: CT THORACIC SPINE FINDINGS

Alignment: Grade 1 anterolisthesis of C7 on T1.

Vertebrae: Mild chronic anterior wedging of the T7 and T8 vertebral
bodies, unchanged. No acute fracture. New erosive changes involving
the T10 inferior and T11 superior endplates with paravertebral soft
tissue edema/phlegmon. Soft tissue is contiguous with the T10-11
facet joints which demonstrate new joint space widening. Limited
assessment for epidural abscess on CT.

Paraspinal and other soft tissues: No paraspinal fluid collection.
Small bilateral pleural effusions with mild dependent atelectasis in
both lungs. Coronary atherosclerosis. Right jugular catheter
terminating near the superior cavoatrial junction.

Disc levels: Bilateral neural foraminal stenosis at T10-11 due to
infection. Postoperative changes posteriorly at T11-12 and T12-L1.
Severe disc space narrowing at C7-T1 without evidence of significant
stenosis.

CT LUMBAR SPINE FINDINGS

Segmentation: Standard.

Alignment: Moderate lumbar levoscoliosis. Trace retrolisthesis of L1
on L2 and trace anterolisthesis of L3 on L4 and L4 on L5.

Vertebrae: Extensive postsurgical changes with solid posterior
osseous fusion extending from T12 to the sacrum. Diffuse interbody
ankylosis with bridging syndesmophytes. Mild chronic L1 compression
fracture, unchanged from [OV]. No acute fracture or suspicious
osseous lesion. No lucency surrounding the bilateral sacral screws.

Paraspinal and other soft tissues: Postoperative changes throughout
the posterior lumbar soft tissues with diffuse paraspinal muscle
atrophy. No paraspinal fluid collection. IVC filter. Aortic
atherosclerosis. Asymmetric right renal atrophy. Partially
visualized moderate bladder distension.

Disc levels: Diffuse lumbar spine fusion without evidence of
significant stenosis.
IMPRESSION: CT THORACIC SPINE IMPRESSION

1. T10-11 discitis-osteomyelitis with suspected facet joint
involvement as well. Paravertebral soft tissue inflammation without
evidence of paraspinal abscess. Limited assessment for epidural
abscess by CT.
2. Small pleural effusions.

CT LUMBAR SPINE IMPRESSION

1. No acute osseous abnormality identified in the lumbar spine.
2. Chronic L1 compression fracture.
3. Extensive posterior spinal fusion from T12 to the sacrum.

## 2019-09-17 IMAGING — CT CT L SPINE W/O CM
3 series · 13 of 35 positions shown, 16 images · non-contrast
Comparison: CTA chest [DATE]. CT abdomen and pelvis [DATE].

CLINICAL DATA: Bacteremia.

EXAM:
CT THORACIC AND LUMBAR SPINE WITHOUT CONTRAST
TECHNIQUE: Multidetector CT imaging of the thoracic and lumbar spine was
performed without contrast. Multiplanar CT image reconstructions
were also generated.

[Series 1: l-spine 2.0 st · axial · 0.40mm/px · z∈[-836,-660]mm · 5 of 128 slices shown, 7 images (1 of 2)]
[im 20/128  soft-tissue]
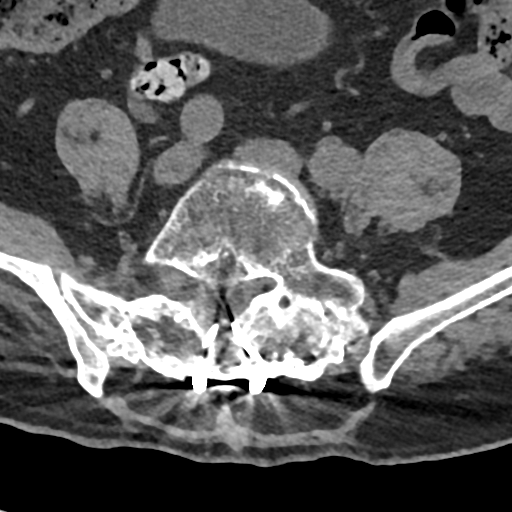
[im 20/128  bone]
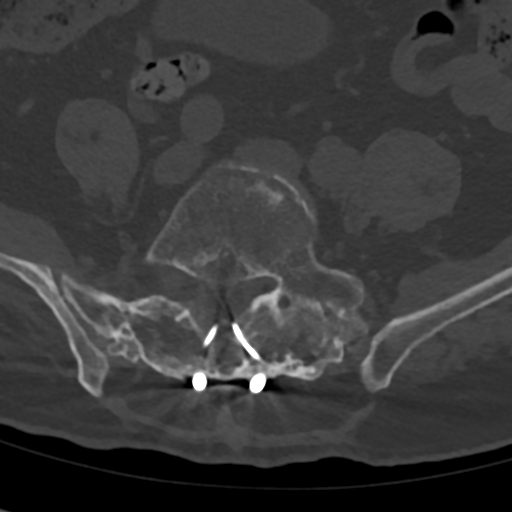
[im 40/128  bone]
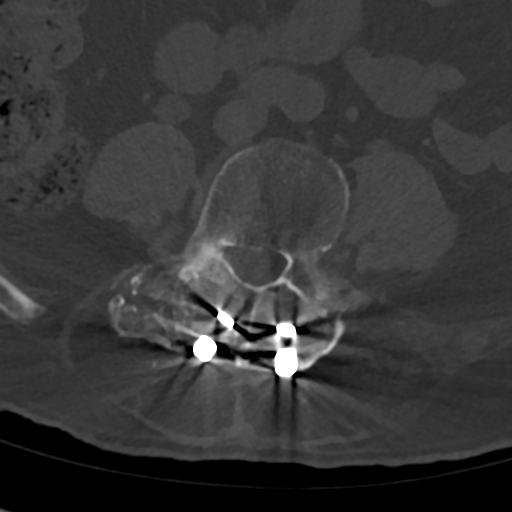
[im 69/128  bone]
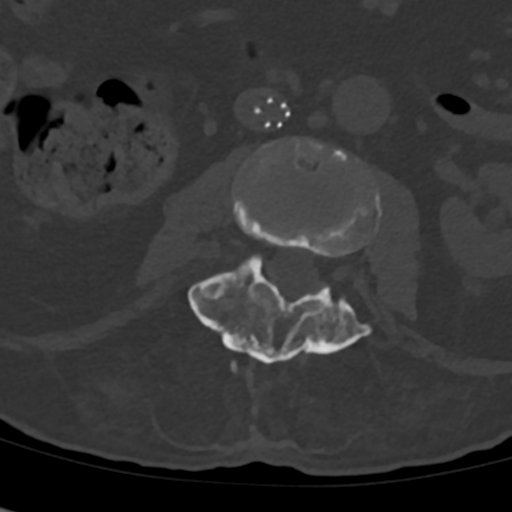
[im 88/128  bone]
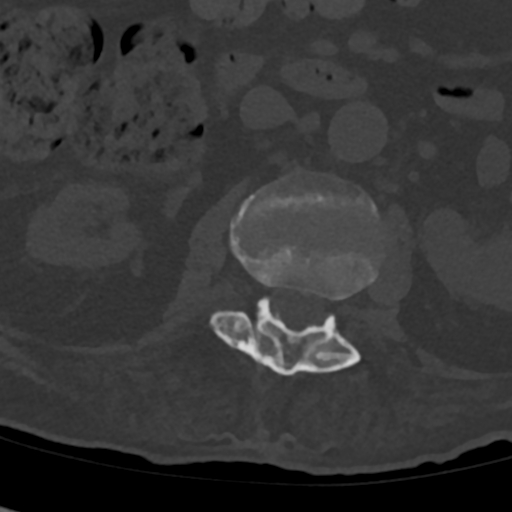
[im 108/128  soft-tissue]
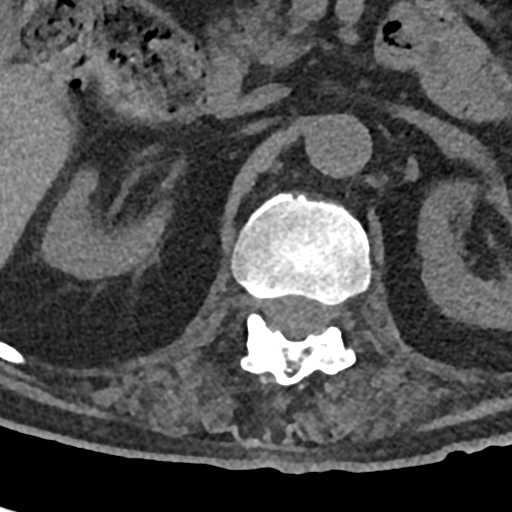
[im 108/128  bone]
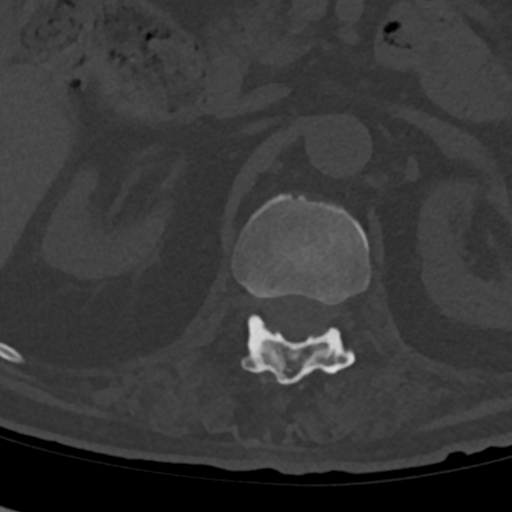

[Series 7: l-spine 2.0 st cor · coronal · 0.49mm/px · 3 of 69 slices shown]
[im 14/69  bone]
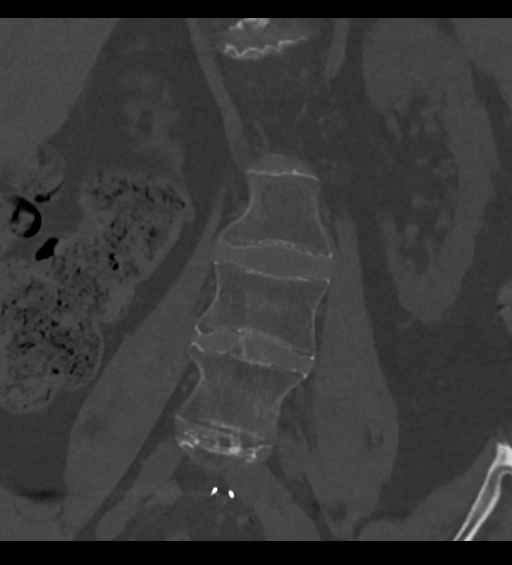
[im 28/69  bone]
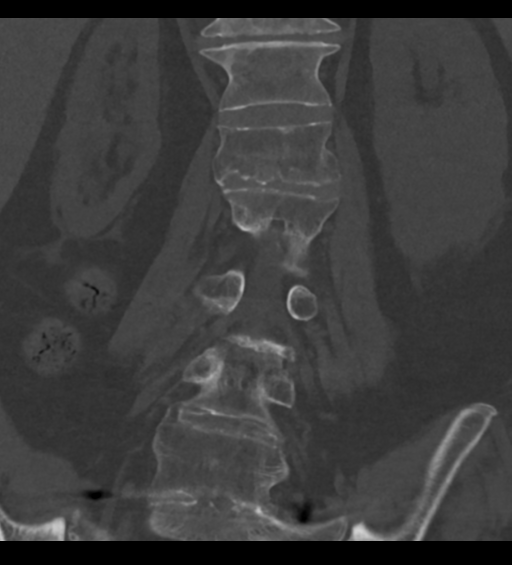
[im 41/69  bone]
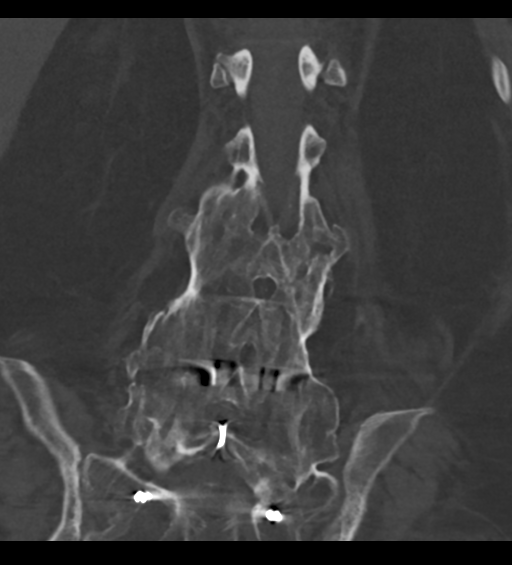

[Series 9: l-spine 2.0 st · sagittal · 0.41mm/px · 5 of 96 slices shown, 6 images (2 of 2)]
[im 32/96  bone]
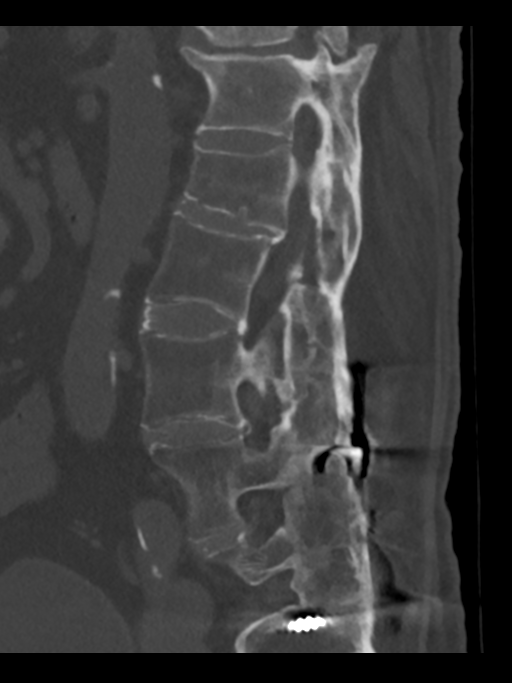
[im 40/96  bone]
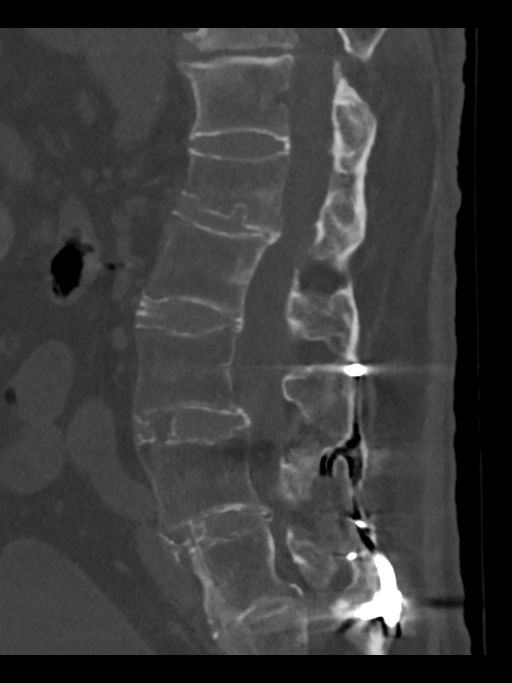
[im 48/96  soft-tissue]
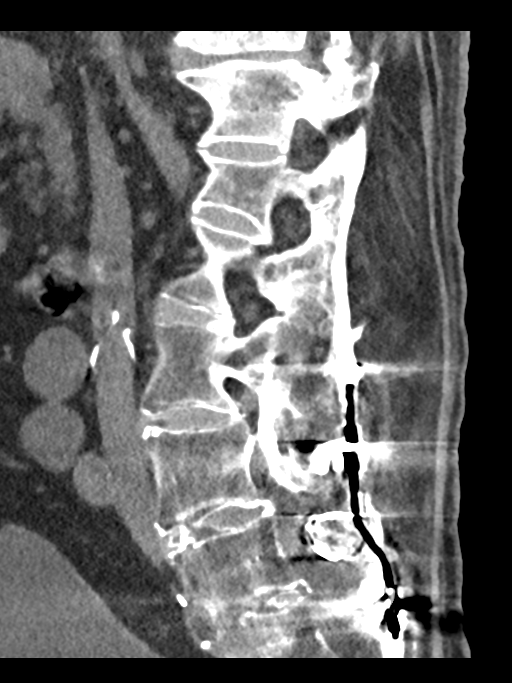
[im 48/96  bone]
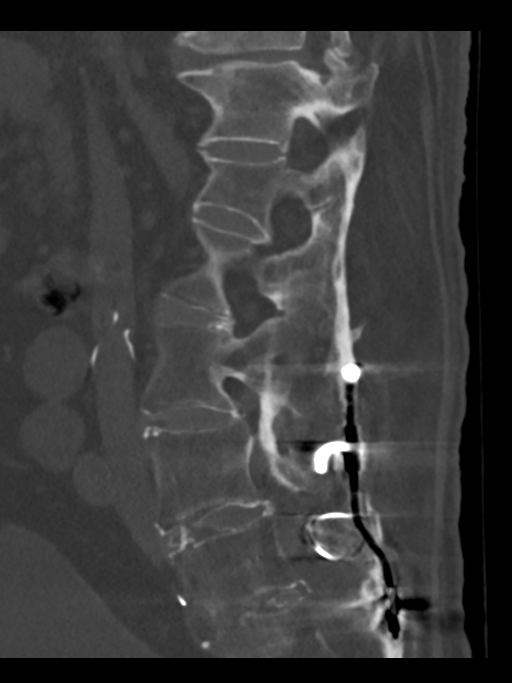
[im 56/96  bone]
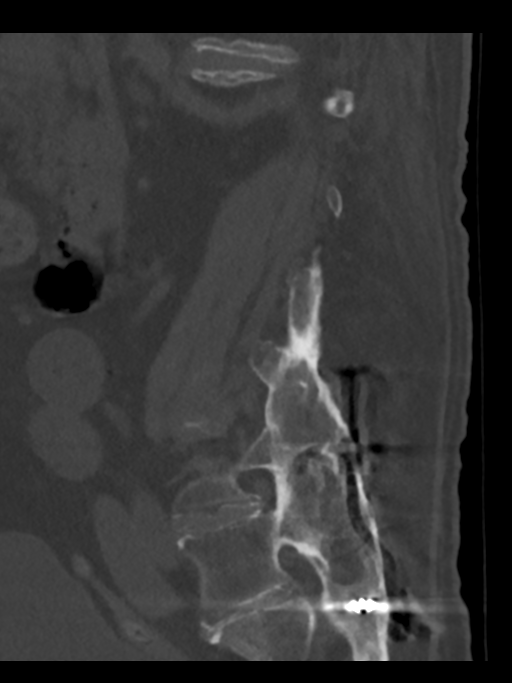
[im 64/96  bone]
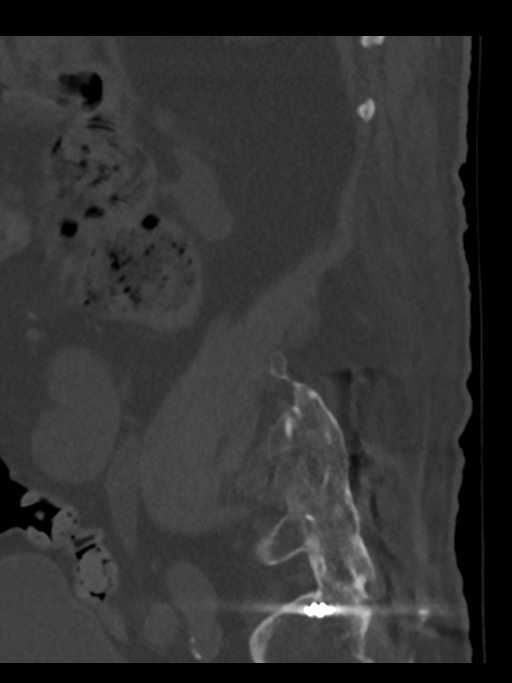

[13 of 35 positions shown; findings below may reference images not displayed]

FINDINGS: CT THORACIC SPINE FINDINGS

Alignment: Grade 1 anterolisthesis of C7 on T1.

Vertebrae: Mild chronic anterior wedging of the T7 and T8 vertebral
bodies, unchanged. No acute fracture. New erosive changes involving
the T10 inferior and T11 superior endplates with paravertebral soft
tissue edema/phlegmon. Soft tissue is contiguous with the T10-11
facet joints which demonstrate new joint space widening. Limited
assessment for epidural abscess on CT.

Paraspinal and other soft tissues: No paraspinal fluid collection.
Small bilateral pleural effusions with mild dependent atelectasis in
both lungs. Coronary atherosclerosis. Right jugular catheter
terminating near the superior cavoatrial junction.

Disc levels: Bilateral neural foraminal stenosis at T10-11 due to
infection. Postoperative changes posteriorly at T11-12 and T12-L1.
Severe disc space narrowing at C7-T1 without evidence of significant
stenosis.

CT LUMBAR SPINE FINDINGS

Segmentation: Standard.

Alignment: Moderate lumbar levoscoliosis. Trace retrolisthesis of L1
on L2 and trace anterolisthesis of L3 on L4 and L4 on L5.

Vertebrae: Extensive postsurgical changes with solid posterior
osseous fusion extending from T12 to the sacrum. Diffuse interbody
ankylosis with bridging syndesmophytes. Mild chronic L1 compression
fracture, unchanged from [OV]. No acute fracture or suspicious
osseous lesion. No lucency surrounding the bilateral sacral screws.

Paraspinal and other soft tissues: Postoperative changes throughout
the posterior lumbar soft tissues with diffuse paraspinal muscle
atrophy. No paraspinal fluid collection. IVC filter. Aortic
atherosclerosis. Asymmetric right renal atrophy. Partially
visualized moderate bladder distension.

Disc levels: Diffuse lumbar spine fusion without evidence of
significant stenosis.
IMPRESSION: CT THORACIC SPINE IMPRESSION

1. T10-11 discitis-osteomyelitis with suspected facet joint
involvement as well. Paravertebral soft tissue inflammation without
evidence of paraspinal abscess. Limited assessment for epidural
abscess by CT.
2. Small pleural effusions.

CT LUMBAR SPINE IMPRESSION

1. No acute osseous abnormality identified in the lumbar spine.
2. Chronic L1 compression fracture.
3. Extensive posterior spinal fusion from T12 to the sacrum.

## 2019-09-17 NOTE — TOC Progression Note (Signed)
`  Transition of Care [facility]) - Progression Note    Patient Details  Name: SIMUEL LANGTON MRN: [id number] Date of Birth: 25-Feb-1951  Transition of Care [facility]) CM/SW [place], [place] Phone Number: 09/17/2019, 2:27 PM  Clinical Narrative:    Per RNCM notes pt without support at home. Teachable caregiver needs to be in place in order for pt to receive IV ABX @ home.   CSW spoke with pt via telephone about recommendations for SNF placement given mobility challenges and care needs.  Introduced self, role, reason for call. Pt adamant about "finding out whats wrong with my back with the CT scan." CSW encouraged pt to let us send referral to give him options while we wait for scan results. Pt amenable to referral only to [place]- "I've been to every one, I'd only go back to [employer] securely through hub.   TOC team will continue to monitor   Expected Discharge Plan: [place] Barriers to Discharge: No Barriers Identified  Expected Discharge Plan and Services Expected Discharge Plan: [place] arrangements for the past 2 months: Single Family Home Expected Discharge Date: 09/08/19         Readmission Risk Interventions No flowsheet data found.

## 2019-09-17 NOTE — Progress Notes (Signed)
OT Cancellation Note  Patient Details Name: Thomas [name] MRN: [id number] DOB: May 11, 1951   Cancelled Treatment:    Reason Eval/Treat Not Completed: Other (comment). Pt report she is too much pain right now, will check back as schedule allows.   [place], OTR/L Acute Rehab Services Pager [phone] Office [phone]     Almon Register 09/17/2019, 2:57 PM

## 2019-09-17 NOTE — Progress Notes (Signed)
PROGRESS NOTE    Thomas [name]  [id number] DOB: Jan 18, 1951 DOA: 08/31/2019 PCP: Antonietta Jewel, MD   Brief Narrative: Thomas [name] is a 68 y.o. malewithhistory of diabetes mellitus type 2, hypertension, chronic kidney disease stage III, anemia who was admitted at [facility] after having fever chills and back pain. 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    Assessment & Plan:   Principal Problem:   MRSA bacteremia Active Problems:   [name], right ([place])   [name], type 2) ([place])   HTN (hypertension)   Discitis   Pressure injury of skin   T10-T11 discitis MRSA bacteremia Left lower extremity wound In setting of chronic left foot wound. Patient treated with IV daptomycin. Transthoracic Echocardiogram at OSH significant for no vegetation. Recommendation for Transesophageal Echocardiogram at 4-6 weeks as an outpatient. -ID recommendations: Daptomycin while inpatient; recommendation for Dalbavancin on discharge -CT thoracic/lumbar spine w/o contrast secondary to worsening pain (patient states he was told not to have MRI for unknown reason)  Chronic pain syndrome Patient follows with a pain clinic. He is on Oxycontin and Neurontin as an outpatient. Started on hydromorphone impatient. -Continue Oxycontin, gabapentin,   Diabetes mellitus, type 2 On glipizide as an outpatient -Continue SSI  AKI on CKD stage IV Baseline creatinine of about 2.7. Peak creatinine of 3.23. currently back to baseline.  Hypothyroidism -Continue Synthroid  Essential hypertension -Continue amlodipine, clonidine, hydralazine, metoprolol  Hyperkalemia Likely secondary to AKI. Given Lokelma. Resolved.  Anemia of chronic disease Baseline creatinine of about 9 to 10. Stable.  Back pain Acute on chronic. Management above  Hiccups Resolved.  History of BKA Stable.  Pressure injury Left heel/foot, POA   DVT prophylaxis: Heparin Code  Status:   Code Status: Full Code Family Communication: None at bedside Disposition Plan: Discharge pending improved pain control. Patient now interested in SNF since pain is difficult to control and mobility is significantly limited   Consultants:   Infectious disease  Procedures:   None  Antimicrobials:  Daptomycin    Subjective: Pain worsened again. Patient unable to move much.  Objective: Vitals:   09/16/19 2129 09/16/19 2246 09/17/19 0524 09/17/19 0902  BP:  130/80 128/77 130/72  Pulse: 64 (!) 58 (!) 58 65  Resp:  18 18   Temp:  97.9 F (36.6 C) 98.3 F (36.8 C)   TempSrc:  Oral Oral   SpO2:  96% 98%   Weight:   111.8 kg   Height:        Intake/Output Summary (Last 24 hours) at 09/17/2019 1008 Last data filed at 09/16/2019 2230 Gross per 24 hour  Intake 1200 ml  Output 1700 ml  Net -500 ml   Filed Weights   09/15/19 0335 09/15/19 0548 09/17/19 0524  Weight: 110.6 kg 110.6 kg 111.8 kg    Examination:  General exam: Appears calm and comfortable Respiratory system: Clear to auscultation. Respiratory effort normal. Cardiovascular system: S1 & S2 heard, RRR. No murmurs, rubs, gallops or clicks. Gastrointestinal system: Abdomen is nondistended, soft and nontender. No organomegaly or masses felt. Normal bowel sounds heard. Central nervous system: Alert and oriented. No focal neurological deficits. Extremities: No edema. No calf tenderness. Right BKA Skin: No cyanosis. No rashes Psychiatry: Judgement and insight appear normal. Mood & affect appropriate.     Data Reviewed: I have personally reviewed following labs and imaging studies  CBC: Recent Labs  Lab 09/12/19 0209 09/14/19 0312  WBC 5.5 5.8  NEUTROABS 3.2 3.1  HGB 9.0* 8.8*  HCT 28.4* 26.9*  MCV 93.1 93.7  PLT 252 [id number]   Basic Metabolic Panel: Recent Labs  Lab 09/12/19 0209 09/14/19 0312  NA 137 135  K 5.1 4.8  CL 110 106  CO2 18* 19*  GLUCOSE 161* 146*  BUN 53* 54*  CREATININE  2.91* 2.88*  CALCIUM 8.6* 8.6*  PHOS 5.1* 6.0*   GFR: Estimated Creatinine Clearance: 35.5 mL/min (A) (by C-G formula based on SCr of 2.88 mg/dL (H)). Liver Function Tests: Recent Labs  Lab 09/12/19 0209 09/14/19 0312  ALBUMIN 2.4* 2.3*   No results for input(s): LIPASE, AMYLASE in the last 168 hours. No results for input(s): AMMONIA in the last 168 hours. Coagulation Profile: No results for input(s): INR, PROTIME in the last 168 hours. Cardiac Enzymes: Recent Labs  Lab 09/15/19 0413  CKTOTAL 92   BNP (last 3 results) No results for input(s): PROBNP in the last 8760 hours. HbA1C: No results for input(s): HGBA1C in the last 72 hours. CBG: Recent Labs  Lab 09/16/19 0802 09/16/19 1225 09/16/19 1713 09/16/19 2244 09/17/19 0753  GLUCAP 155* 166* 152* 133* 139*   Lipid Profile: No results for input(s): CHOL, HDL, LDLCALC, TRIG, CHOLHDL, LDLDIRECT in the last 72 hours. Thyroid Function Tests: No results for input(s): TSH, T4TOTAL, FREET4, T3FREE, THYROIDAB in the last 72 hours. Anemia Panel: No results for input(s): VITAMINB12, FOLATE, FERRITIN, TIBC, IRON, RETICCTPCT in the last 72 hours. Sepsis Labs: No results for input(s): PROCALCITON, LATICACIDVEN in the last 168 hours.  No results found for this or any previous visit (from the past 240 hour(s)).       Radiology Studies: No results found.      Scheduled Meds: . allopurinol  100 mg Oral BID  . amLODipine  10 mg Oral Daily  . Chlorhexidine Gluconate Cloth  6 each Topical Daily  . cloNIDine  0.3 mg Oral Q8H  . diclofenac  1 patch Transdermal BID  . ferrous sulfate  325 mg Oral BID WC  . finasteride  5 mg Oral Daily  . gabapentin  300 mg Oral TID  . heparin  5,000 Units Subcutaneous Q8H  . hydrALAZINE  50 mg Oral Q8H  . insulin aspart  0-9 Units Subcutaneous TID WC  . levothyroxine  50 mcg Oral Q0600  . metoprolol tartrate  50 mg Oral BID  . oxyCODONE  20 mg Oral Q8H  . sodium bicarbonate  650 mg  Oral Daily  . sorbitol  15 mL Oral Daily  . tamsulosin  0.4 mg Oral Daily  . vitamin B-12  2,500 mcg Oral Daily   Continuous Infusions: . sodium chloride 1,000 mL (09/16/19 1820)  . DAPTOmycin (CUBICIN)  IV Stopped (09/16/19 2154)     LOS: 17 days     Cordelia Poche, MD [facility] 09/17/2019, 10:08 AM  If 7PM-7AM, please contact night-coverage [email or web address]

## 2019-09-18 LAB — GLUCOSE, CAPILLARY
Glucose-Capillary: 153 mg/dL — ABNORMAL HIGH (ref 70–99)
Glucose-Capillary: 248 mg/dL — ABNORMAL HIGH (ref 70–99)
Glucose-Capillary: 165 mg/dL — ABNORMAL HIGH (ref 70–99)
Glucose-Capillary: 191 mg/dL — ABNORMAL HIGH (ref 70–99)

## 2019-09-18 NOTE — Plan of Care (Signed)
Health progressing, with tolerance of activity  and pain management.

## 2019-09-18 NOTE — Progress Notes (Signed)
OT Cancellation Note  Patient Details Name: KEDERICK DEBARGE MRN: [id number] DOB: 1951-03-29   Cancelled Treatment:    Reason Eval/Treat Not Completed: Pain limiting ability to participate.  Pt adamantly refusing any activity citing pain (pt has been premedicated).  He says he has been moving his legs and arms in bed and that's all he is going to.  Extensive explanation re: need to increase activity, consequences of immobility.  Pt then agreeable to only raising his head 5*, and nothing more.  Pt has an extensive history of refusing therapies, and this is the second attempt by OT to complete eval.  Explained department protoccol of 3 refusals will result in discharge from services.  He is agreeable to trying again tomorrow.   Lucille Passy, OTR/L [place] Pager [phone] Office [phone]   Lucille Passy M 09/18/2019, 11:01 AM

## 2019-09-18 NOTE — Progress Notes (Signed)
PROGRESS NOTE    NOCTIS FIEN  [id number] DOB: 21-Apr-1951 DOA: 08/31/2019 PCP: Antonietta Jewel, MD   Brief Narrative: Thomas [name] is a 68 y.o. malewithhistory of diabetes mellitus type 2, hypertension, chronic kidney disease stage III, anemia who was admitted at [facility] after having fever chills and back pain. 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    Assessment & Plan:   Principal Problem:   MRSA bacteremia Active Problems:   [name], right ([place])   [name], type 2) ([place])   HTN (hypertension)   Discitis   Pressure injury of skin   T10-T11 discitis MRSA bacteremia Left lower extremity wound In setting of chronic left foot wound. Patient treated with IV daptomycin. Transthoracic Echocardiogram at OSH significant for no vegetation. Recommendation for Transesophageal Echocardiogram at 4-6 weeks as an outpatient. Repeat CT scan still significant for discitis/osteomyelitis of T10-T11 with limited assessment for epidural abscess. Patient states he cannot get MRIs -ID recommendations: Daptomycin while inpatient; recommendation for Dalbavancin on discharge if discharging home  Chronic pain syndrome Patient follows with a pain clinic. He is on Oxycontin and Neurontin as an outpatient. Started on hydromorphone impatient. -Continue Oxycontin, hydromorphone, gabapentin,   Diabetes mellitus, type 2 On glipizide as an outpatient -Continue SSI  AKI on CKD stage IV Baseline creatinine of about 2.7. Peak creatinine of 3.23. currently back to baseline.  Hypothyroidism -Continue Synthroid  Essential hypertension -Continue amlodipine, clonidine, hydralazine, metoprolol  Hyperkalemia Likely secondary to AKI. Given Lokelma. Resolved.  Anemia of chronic disease Baseline creatinine of about 9 to 10. Stable.  Back pain Acute on chronic. Management above  Hiccups Resolved.  History of BKA Stable.  Pressure injury Left  heel/foot, POA   DVT prophylaxis: Heparin Code Status:   Code Status: Full Code Family Communication: None at bedside Disposition Plan: Discharge pending improved pain control. Patient now interested in SNF since pain is difficult to control and mobility is significantly limited   Consultants:   Infectious disease  Procedures:   None  Antimicrobials:  Daptomycin    Subjective: Pain is controlled as long as he does not move.  Objective: Vitals:   09/17/19 2139 09/18/19 0456 09/18/19 0511 09/18/19 0800  BP: 131/75  125/78 (!) 146/89  Pulse: 65  (!) 59 67  Resp: 16  16 16   Temp: 98.4 F (36.9 C)  98.1 F (36.7 C) 97.7 F (36.5 C)  TempSrc: Oral  Oral Oral  SpO2: 97%  97% 98%  Weight:  108.2 kg    Height:        Intake/Output Summary (Last 24 hours) at 09/18/2019 1212 Last data filed at 09/17/2019 2310 Gross per 24 hour  Intake 480 ml  Output 900 ml  Net -420 ml   Filed Weights   09/15/19 0548 09/17/19 0524 09/18/19 0456  Weight: 110.6 kg 111.8 kg 108.2 kg    Examination:  General exam: Appears calm and comfortable Respiratory system: Clear to auscultation. Respiratory effort normal. Cardiovascular system: S1 & S2 heard, RRR. No murmurs, rubs, gallops or clicks. Gastrointestinal system: Abdomen is nondistended, soft and nontender. No organomegaly or masses felt. Normal bowel sounds heard. Central nervous system: Alert and oriented. No focal neurological deficits. Extremities: No edema. No calf tenderness. Right BKA Skin: No cyanosis. No rashes Psychiatry: Judgement and insight appear normal. Mood & affect appropriate.      Data Reviewed: I have personally reviewed following labs and imaging studies  CBC: Recent Labs  Lab 09/12/19 0209 09/14/19 0312 09/17/19 1449  WBC 5.5 5.8 5.8  NEUTROABS 3.2 3.1  --   HGB 9.0* 8.8* 8.9*  HCT 28.4* 26.9* 27.5*  MCV 93.1 93.7 93.2  PLT 252 239 [id number]   Basic Metabolic Panel: Recent Labs  Lab 09/12/19 0209  09/14/19 0312 09/17/19 1449  NA 137 135 137  K 5.1 4.8 5.1  CL 110 106 106  CO2 18* 19* 23  GLUCOSE 161* 146* 138*  BUN 53* 54* 53*  CREATININE 2.91* 2.88* 2.99*  CALCIUM 8.6* 8.6* 9.1  PHOS 5.1* 6.0*  --    GFR: Estimated Creatinine Clearance: 31.4 mL/min (A) (by C-G formula based on SCr of 2.99 mg/dL (H)). Liver Function Tests: Recent Labs  Lab 09/12/19 0209 09/14/19 0312  ALBUMIN 2.4* 2.3*   No results for input(s): LIPASE, AMYLASE in the last 168 hours. No results for input(s): AMMONIA in the last 168 hours. Coagulation Profile: No results for input(s): INR, PROTIME in the last 168 hours. Cardiac Enzymes: Recent Labs  Lab 09/15/19 0413  CKTOTAL 92   BNP (last 3 results) No results for input(s): PROBNP in the last 8760 hours. HbA1C: No results for input(s): HGBA1C in the last 72 hours. CBG: Recent Labs  Lab 09/17/19 0753 09/17/19 1235 09/17/19 1700 09/17/19 2140 09/18/19 0818  GLUCAP 139* 170* 189* 138* 153*   Lipid Profile: No results for input(s): CHOL, HDL, LDLCALC, TRIG, CHOLHDL, LDLDIRECT in the last 72 hours. Thyroid Function Tests: No results for input(s): TSH, T4TOTAL, FREET4, T3FREE, THYROIDAB in the last 72 hours. Anemia Panel: No results for input(s): VITAMINB12, FOLATE, FERRITIN, TIBC, IRON, RETICCTPCT in the last 72 hours. Sepsis Labs: No results for input(s): PROCALCITON, LATICACIDVEN in the last 168 hours.  No results found for this or any previous visit (from the past 240 hour(s)).       Radiology Studies: Ct Thoracic Spine Wo Contrast  Result Date: 09/17/2019 CLINICAL DATA:  Bacteremia. EXAM: CT THORACIC AND LUMBAR SPINE WITHOUT CONTRAST TECHNIQUE: Multidetector CT imaging of the thoracic and lumbar spine was performed without contrast. Multiplanar CT image reconstructions were also generated. COMPARISON:  CTA chest 09/18/2018. CT abdomen and pelvis 03/16/2018. FINDINGS: CT THORACIC SPINE FINDINGS Alignment: Grade 1 anterolisthesis  of C7 on T1. Vertebrae: Mild chronic anterior wedging of the T7 and T8 vertebral bodies, unchanged. No acute fracture. New erosive changes involving the T10 inferior and T11 superior endplates with paravertebral soft tissue edema/phlegmon. Soft tissue is contiguous with the T10-11 facet joints which demonstrate new joint space widening. Limited assessment for epidural abscess on CT. Paraspinal and other soft tissues: No paraspinal fluid collection. Small bilateral pleural effusions with mild dependent atelectasis in both lungs. Coronary atherosclerosis. Right jugular catheter terminating near the superior cavoatrial junction. Disc levels: Bilateral neural foraminal stenosis at T10-11 due to infection. Postoperative changes posteriorly at T11-12 and T12-L1. Severe disc space narrowing at C7-T1 without evidence of significant stenosis. CT LUMBAR SPINE FINDINGS Segmentation: Standard. Alignment: Moderate lumbar levoscoliosis. Trace retrolisthesis of L1 on L2 and trace anterolisthesis of L3 on L4 and L4 on L5. Vertebrae: Extensive postsurgical changes with solid posterior osseous fusion extending from T12 to the sacrum. Diffuse interbody ankylosis with bridging syndesmophytes. Mild chronic L1 compression fracture, unchanged from 2019. No acute fracture or suspicious osseous lesion. No lucency surrounding the bilateral sacral screws. Paraspinal and other soft tissues: Postoperative changes throughout the posterior lumbar soft tissues with diffuse paraspinal muscle atrophy. No paraspinal fluid collection. IVC filter. Aortic atherosclerosis. Asymmetric right renal  atrophy. Partially visualized moderate bladder distension. Disc levels: Diffuse lumbar spine fusion without evidence of significant stenosis. IMPRESSION: CT THORACIC SPINE IMPRESSION 1. 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 Paravertebral soft tissue inflammation without evidence of paraspinal abscess. Limited assessment for  epidural abscess by CT. 2. Small pleural effusions. CT LUMBAR SPINE IMPRESSION 1. No acute osseous abnormality identified in the lumbar spine. 2. Chronic L1 compression fracture. 3. Extensive posterior spinal fusion from T12 to the sacrum. Electronically Signed   By: Logan Bores M.D.   On: 09/17/2019 12:56   Ct Lumbar Spine Wo Contrast  Result Date: 09/17/2019 CLINICAL DATA:  Bacteremia. EXAM: CT THORACIC AND LUMBAR SPINE WITHOUT CONTRAST TECHNIQUE: Multidetector CT imaging of the thoracic and lumbar spine was performed without contrast. Multiplanar CT image reconstructions were also generated. COMPARISON:  CTA chest 09/18/2018. CT abdomen and pelvis 03/16/2018. FINDINGS: CT THORACIC SPINE FINDINGS Alignment: Grade 1 anterolisthesis of C7 on T1. Vertebrae: Mild chronic anterior wedging of the T7 and T8 vertebral bodies, unchanged. No acute fracture. New erosive changes involving the T10 inferior and T11 superior endplates with paravertebral soft tissue edema/phlegmon. Soft tissue is contiguous with the T10-11 facet joints which demonstrate new joint space widening. Limited assessment for epidural abscess on CT. Paraspinal and other soft tissues: No paraspinal fluid collection. Small bilateral pleural effusions with mild dependent atelectasis in both lungs. Coronary atherosclerosis. Right jugular catheter terminating near the superior cavoatrial junction. Disc levels: Bilateral neural foraminal stenosis at T10-11 due to infection. Postoperative changes posteriorly at T11-12 and T12-L1. Severe disc space narrowing at C7-T1 without evidence of significant stenosis. CT LUMBAR SPINE FINDINGS Segmentation: Standard. Alignment: Moderate lumbar levoscoliosis. Trace retrolisthesis of L1 on L2 and trace anterolisthesis of L3 on L4 and L4 on L5. Vertebrae: Extensive postsurgical changes with solid posterior osseous fusion extending from T12 to the sacrum. Diffuse interbody ankylosis with bridging syndesmophytes. Mild  chronic L1 compression fracture, unchanged from 2019. No acute fracture or suspicious osseous lesion. No lucency surrounding the bilateral sacral screws. Paraspinal and other soft tissues: Postoperative changes throughout the posterior lumbar soft tissues with diffuse paraspinal muscle atrophy. No paraspinal fluid collection. IVC filter. Aortic atherosclerosis. Asymmetric right renal atrophy. Partially visualized moderate bladder distension. Disc levels: Diffuse lumbar spine fusion without evidence of significant stenosis. IMPRESSION: CT THORACIC SPINE IMPRESSION 1. 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 Paravertebral soft tissue inflammation without evidence of paraspinal abscess. Limited assessment for epidural abscess by CT. 2. Small pleural effusions. CT LUMBAR SPINE IMPRESSION 1. No acute osseous abnormality identified in the lumbar spine. 2. Chronic L1 compression fracture. 3. Extensive posterior spinal fusion from T12 to the sacrum. Electronically Signed   By: Logan Bores M.D.   On: 09/17/2019 12:56        Scheduled Meds: . allopurinol  100 mg Oral BID  . amLODipine  10 mg Oral Daily  . Chlorhexidine Gluconate Cloth  6 each Topical Daily  . cloNIDine  0.3 mg Oral Q8H  . diclofenac  1 patch Transdermal BID  . ferrous sulfate  325 mg Oral BID WC  . finasteride  5 mg Oral Daily  . gabapentin  300 mg Oral TID  . heparin  5,000 Units Subcutaneous Q8H  . hydrALAZINE  50 mg Oral Q8H  . insulin aspart  0-9 Units Subcutaneous TID WC  . levothyroxine  50 mcg Oral Q0600  . metoprolol tartrate  50 mg Oral BID  . oxyCODONE  20 mg Oral Q8H  .  sodium bicarbonate  650 mg Oral Daily  . sorbitol  15 mL Oral Daily  . tamsulosin  0.4 mg Oral Daily  . vitamin B-12  2,500 mcg Oral Daily   Continuous Infusions: . sodium chloride 1,000 mL (09/16/19 1820)  . DAPTOmycin (CUBICIN)  IV 1,000 mg (09/17/19 2004)     LOS: 18 days     Cordelia Poche, MD [facility] 09/18/2019, 12:12 PM  If 7PM-7AM, please contact night-coverage [email or web address]

## 2019-09-19 LAB — BASIC METABOLIC PANEL
Anion gap: 8 (ref 5–15)
BUN: 47 mg/dL — ABNORMAL HIGH (ref 8–23)
CO2: 24 mmol/L (ref 22–32)
Calcium: 9.1 mg/dL (ref 8.9–10.3)
Chloride: 105 mmol/L (ref 98–111)
Creatinine, Ser: 3.18 mg/dL — ABNORMAL HIGH (ref 0.61–1.24)
GFR calc Af Amer: 22 mL/min — ABNORMAL LOW (ref >60)
GFR calc non Af Amer: 19 mL/min — ABNORMAL LOW (ref >60)
Glucose, Bld: 157 mg/dL — ABNORMAL HIGH (ref 70–99)
Potassium: 4.9 mmol/L (ref 3.5–5.1)
Sodium: 137 mmol/L (ref 135–145)

## 2019-09-19 LAB — GLUCOSE, CAPILLARY
Glucose-Capillary: 139 mg/dL — ABNORMAL HIGH (ref 70–99)
Glucose-Capillary: 185 mg/dL — ABNORMAL HIGH (ref 70–99)
Glucose-Capillary: 159 mg/dL — ABNORMAL HIGH (ref 70–99)
Glucose-Capillary: 192 mg/dL — ABNORMAL HIGH (ref 70–99)

## 2019-09-19 NOTE — Progress Notes (Signed)
NCM called and left voice message with [place], to review referral for potential SNF bed offer. Awaiting call back. TOC team will continue to monitor. Whitman Hero RN,BSN,CM

## 2019-09-19 NOTE — Progress Notes (Signed)
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 Patient transferred from [facility] for MRSA bacteremia. CT abdomen showing discitis involvling T10, T11 area.    PT Comments    Patient received in bed, premedicated by RN and willing to work with therapy. Impulsive and with poor cognition today, very poor safety awareness and sequencing. Able to perform bed mobility generally with min guard but often threw himself back into supine or sidelying without warning due to pain, sometimes so close to the EOB therapists were concerned he would slide off of the side. Seems anxious with mobility as well. Donned prosthetic with S and cues, poor understanding of how it now does not fit well due to weight loss, described story where his insurance would not allow him to get a new prosthetic so he altered one himself (?). Able to perform sit to stand with MinAx2 for safety with RW, also able to take sidesteps alongside EOB with RW and MinAx2 for safety. He was left in bed with all needs met, bed alarm active. Continue to emphatically recommend SNF due to weakness and notable cognition impairment with reduced safety awareness.     Follow Up Recommendations  SNF     Equipment Recommendations  None recommended by PT    Recommendations for Other Services       Precautions / Restrictions Precautions Precautions: Fall Precaution Comments: contact precautions MRSA Restrictions Weight Bearing Restrictions: No    Mobility  Bed Mobility Overal bed mobility: Needs Assistance Bed Mobility: Rolling;Sidelying to Sit;Sit to Sidelying Rolling: Modified independent (Device/Increase  time) Sidelying to sit: Min guard     Sit to sidelying: Min guard General bed mobility comments: Min guard for safety, often throwing himself back into sidelying or supine due to back pain, VC for technique and safety  Transfers Overall transfer level: Needs assistance Equipment used: Rolling walker (2 wheeled) Transfers: Sit to/from Stand Sit to Stand: Min assist;+2 safety/equipment;From elevated surface         General transfer comment: MinAx2 for boost to standing and for balance, prosthetic too loose as he has lost considerable weight  Ambulation/Gait         Gait velocity: decreased   General Gait Details: unsafe due to pain/impulsivity/tendency to sit down quickly and without much warning, but able to take sidesteps along EOB with MinAx2   Stairs             Wheelchair Mobility    Modified Rankin (Stroke Patients Only)       Balance Overall balance assessment: Needs assistance Sitting-balance support: Feet supported Sitting balance-[name]: Fair Sitting balance - Comments: min guard for safety, often throwing self back on bed due to pain     Standing balance-[name]: Poor Standing balance comment: reliant on B UE support                            Cognition Arousal/Alertness: Awake/alert Behavior During Therapy: Impulsive;Anxious Overall Cognitive Status: No family/caregiver present to determine baseline cognitive functioning Area of Impairment: Orientation;Attention;Memory;Following commands;Awareness;Problem solving;Safety/judgement                 Orientation Level: Disoriented  to;Time Current Attention Level: Selective Memory: Decreased short-term memory;Decreased recall of precautions Following Commands: Follows one step commands consistently;Follows one step commands with increased time Safety/Judgement: Decreased awareness of safety;Decreased awareness of deficits Awareness: Intellectual Problem Solving: Requires  verbal cues;Difficulty sequencing General Comments: very all over the place cognition wise today, poor understanding of sequencing and safety, at EOS stated "what's on my foot????" despite having sock put on during session      Exercises      General Comments        Pertinent Vitals/Pain Pain Assessment: Faces Faces Pain Scale: Hurts whole lot Pain Location: back and L groin spasms with attempts to mobilize Pain Descriptors / Indicators: Grimacing;Guarding;Moaning Pain Intervention(s): Limited activity within patient's tolerance;Monitored during session;Premedicated before session;Repositioned    Home Living                      Prior Function            PT Goals (current goals can now be found in the care plan section) Acute Rehab PT Goals Patient Stated Goal: to move when pain controlled PT Goal Formulation: With patient Time For Goal Achievement: 09/30/19 Potential to Achieve Goals: Fair Progress towards PT goals: Progressing toward goals    Frequency           PT Plan Current plan remains appropriate    Co-evaluation PT/OT/SLP Co-Evaluation/Treatment: Yes Reason for Co-Treatment: For patient/therapist safety;To address functional/ADL transfers;Complexity of the patient's impairments (multi-system involvement) PT goals addressed during session: Mobility/safety with mobility;Balance;Proper use of DME;Strengthening/ROM        AM-PAC PT "6 Clicks" Mobility   Outcome Measure  Help needed turning from your back to your side while in a flat bed without using bedrails?: A Little Help needed moving from lying on your back to sitting on the side of a flat bed without using bedrails?: A Little Help needed moving to and from a bed to a chair (including a wheelchair)?: A Little Help needed standing up from a chair using your arms (e.g., wheelchair or bedside chair)?: A Little Help needed to walk in hospital room?: A Lot Help needed climbing 3-5 steps with a  railing? : Total 6 Click Score: 15    End of Session   Activity Tolerance: Patient limited by pain Patient left: in bed;with call bell/phone within reach;with bed alarm set   PT Visit Diagnosis: Muscle weakness (generalized) (M62.81);Difficulty in walking, not elsewhere classified (R26.2);Pain Pain - Right/Left: Right Pain - part of body: Hip(and back)     Time: 1100-1136 PT Time Calculation (min) (ACUTE ONLY): 36 min  Charges:  $Therapeutic Activity: 8-22 mins(co-tx with OT)                     Windell Norfolk, DPT, PN1   Supplemental Physical Therapist [place]    Pager [phone] Acute Rehab Office [phone]

## 2019-09-19 NOTE — Progress Notes (Signed)
PROGRESS NOTE    Thomas [name]  [id number] DOB: 08-Jan-1951 DOA: 08/31/2019 PCP: Antonietta Jewel, MD   Brief Narrative: Thomas [name] is a 68 y.o. malewithhistory of diabetes mellitus type 2, hypertension, chronic kidney disease stage III, anemia who was admitted at [facility] after having fever chills and back pain. 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    Assessment & Plan:   Principal Problem:   MRSA bacteremia Active Problems:   [name], right ([place])   [name], type 2) ([place])   HTN (hypertension)   Discitis   Pressure injury of skin   T10-T11 discitis MRSA bacteremia Left lower extremity wound In setting of chronic left foot wound. Patient treated with IV daptomycin. Transthoracic Echocardiogram at OSH significant for no vegetation. Recommendation for Transesophageal Echocardiogram at 4-6 weeks as an outpatient. Repeat CT scan still significant for discitis/osteomyelitis of T10-T11 with limited assessment for epidural abscess. Patient states he cannot get MRIs -ID recommendations: Daptomycin while inpatient; recommendation for Dalbavancin on discharge if discharging home  Chronic pain syndrome Patient follows with a pain clinic. He is on Oxycontin and Neurontin as an outpatient. Started on hydromorphone impatient. -Continue Oxycontin, hydromorphone, gabapentin,   Diabetes mellitus, type 2 On glipizide as an outpatient -Continue SSI  AKI on CKD stage IV Baseline creatinine of about 2.7. Peak creatinine of 3.23. currently back to baseline.  Hypothyroidism -Continue Synthroid  Essential hypertension -Continue amlodipine, clonidine, hydralazine, metoprolol  Hyperkalemia Likely secondary to AKI. Given Lokelma. Resolved.  Anemia of chronic disease Baseline creatinine of about 9 to 10. Stable.  Back pain Acute on chronic. Management above  Hiccups Resolved.  History of BKA Stable.  Pressure injury Left  heel/foot, POA   DVT prophylaxis: Heparin Code Status:   Code Status: Full Code Family Communication: None at bedside Disposition Plan: Discharge pending improved pain control. Patient now interested in SNF since pain is difficult to control and mobility is significantly limited   Consultants:   Infectious disease  Procedures:   None  Antimicrobials:  Daptomycin    Subjective: Pain is better than it was yesterday. Mobility is better than yesterday  Objective: Vitals:   09/18/19 1245 09/18/19 2055 09/19/19 0500 09/19/19 0800  BP: 122/79 139/80 125/86 115/81  Pulse: (!) 59 66 65 (!) 59  Resp: 18 20 20 18   Temp: 97.9 F (36.6 C) 98.2 F (36.8 C) 98.1 F (36.7 C) 98.1 F (36.7 C)  TempSrc: Oral Oral Oral Oral  SpO2: 99% 98% 100% 98%  Weight:      Height:        Intake/Output Summary (Last 24 hours) at 09/19/2019 1317 Last data filed at 09/18/2019 2316 Gross per 24 hour  Intake -  Output 450 ml  Net -450 ml   Filed Weights   09/15/19 0548 09/17/19 0524 09/18/19 0456  Weight: 110.6 kg 111.8 kg 108.2 kg    Examination:  General exam: Appears calm and comfortable Respiratory system: Clear to auscultation. Respiratory effort normal. Cardiovascular system: S1 & S2 heard, RRR. No murmurs, rubs, gallops or clicks. Gastrointestinal system: Abdomen is nondistended, soft and nontender. No organomegaly or masses felt. Normal bowel sounds heard. Central nervous system: Alert and oriented. No focal neurological deficits. Extremities: No edema. No calf tenderness. Right BKA Skin: No cyanosis. No rashes Psychiatry: Judgement and insight appear normal. Mood & affect appropriate.     Data Reviewed: I have personally reviewed following labs and imaging studies  CBC:  Recent Labs  Lab 09/14/19 0312 09/17/19 1449  WBC 5.8 5.8  NEUTROABS 3.1  --   HGB 8.8* 8.9*  HCT 26.9* 27.5*  MCV 93.7 93.2  PLT 239 [id number]   Basic Metabolic Panel: Recent Labs  Lab 09/14/19 0312  09/17/19 1449  NA 135 137  K 4.8 5.1  CL 106 106  CO2 19* 23  GLUCOSE 146* 138*  BUN 54* 53*  CREATININE 2.88* 2.99*  CALCIUM 8.6* 9.1  PHOS 6.0*  --    GFR: Estimated Creatinine Clearance: 31.4 mL/min (A) (by C-G formula based on SCr of 2.99 mg/dL (H)). Liver Function Tests: Recent Labs  Lab 09/14/19 0312  ALBUMIN 2.3*   No results for input(s): LIPASE, AMYLASE in the last 168 hours. No results for input(s): AMMONIA in the last 168 hours. Coagulation Profile: No results for input(s): INR, PROTIME in the last 168 hours. Cardiac Enzymes: Recent Labs  Lab 09/15/19 0413  CKTOTAL 92   BNP (last 3 results) No results for input(s): PROBNP in the last 8760 hours. HbA1C: No results for input(s): HGBA1C in the last 72 hours. CBG: Recent Labs  Lab 09/18/19 1216 09/18/19 1713 09/18/19 2058 09/19/19 0736 09/19/19 1155  GLUCAP 248* 165* 191* 139* 185*   Lipid Profile: No results for input(s): CHOL, HDL, LDLCALC, TRIG, CHOLHDL, LDLDIRECT in the last 72 hours. Thyroid Function Tests: No results for input(s): TSH, T4TOTAL, FREET4, T3FREE, THYROIDAB in the last 72 hours. Anemia Panel: No results for input(s): VITAMINB12, FOLATE, FERRITIN, TIBC, IRON, RETICCTPCT in the last 72 hours. Sepsis Labs: No results for input(s): PROCALCITON, LATICACIDVEN in the last 168 hours.  No results found for this or any previous visit (from the past 240 hour(s)).       Radiology Studies: No results found.      Scheduled Meds: . allopurinol  100 mg Oral BID  . amLODipine  10 mg Oral Daily  . Chlorhexidine Gluconate Cloth  6 each Topical Daily  . cloNIDine  0.3 mg Oral Q8H  . diclofenac  1 patch Transdermal BID  . ferrous sulfate  325 mg Oral BID WC  . finasteride  5 mg Oral Daily  . gabapentin  300 mg Oral TID  . heparin  5,000 Units Subcutaneous Q8H  . hydrALAZINE  50 mg Oral Q8H  . insulin aspart  0-9 Units Subcutaneous TID WC  . levothyroxine  50 mcg Oral Q0600  .  metoprolol tartrate  50 mg Oral BID  . oxyCODONE  20 mg Oral Q8H  . sodium bicarbonate  650 mg Oral Daily  . sorbitol  15 mL Oral Daily  . tamsulosin  0.4 mg Oral Daily  . vitamin B-12  2,500 mcg Oral Daily   Continuous Infusions: . sodium chloride 1,000 mL (09/16/19 1820)  . DAPTOmycin (CUBICIN)  IV 1,000 mg (09/18/19 2310)     LOS: 19 days     Cordelia Poche, MD [facility] 09/19/2019, 1:17 PM  If 7PM-7AM, please contact night-coverage [email or web address]

## 2019-09-19 NOTE — Progress Notes (Signed)
Pharmacy Antibiotic Note  Thomas [name] is a 68 y.o. male admitted on 08/31/2019 with MRSA bacteremia and discitis T10/11, in setting of chronic left foot wound.  Pharmacy was consulted on 08/31/19 for Daptomycin dosing.  Continues on Daptomycin.  Plan 8 weeks of antibiotic treatment, On discharge will switch to dalbavancin >> (end date: 10/26/19) Afebrile. 11/12: CK 187.  On 09/15/19 the CK was normal at 92.  CK next due 09/22/19 AKI on CKD stage IV: Baseline creatinine of about 2.7. Peak creatinine of 3.23. SCr  back to baseline with SCr 2.99 on 11/28.   11/13 MRSA PCR: pos 11/11 Bcx2: negative 11/6 [place] BCx: MRSA   11/11: Dapto>> (10/26/19)  11/12: CK 187 11/15: CK 31 low 11/19: CK 77  11/26: CK 92   Plan: - Continue daptomycin 1000 mg Q 24hours (~8 mg/kg)  - CK levels weekly q Thurs, next due 09/22/19 - Will be switched to dalbavancin on discharge - New OPAT orders pended 11/25  Height: 6\' 6"  (198.1 cm) Weight: 238 lb 8.6 oz (108.2 kg) IBW/kg (Calculated) : 91.4  Temp (24hrs), Avg:98.1 F (36.7 C), Min:97.9 F (36.6 C), Max:98.2 F (36.8 C)  Recent Labs  Lab 09/14/19 0312 09/17/19 1449  WBC 5.8 5.8  CREATININE 2.88* 2.99*    Estimated Creatinine Clearance: 31.4 mL/min (A) (by C-G formula based on SCr of 2.99 mg/dL (H)).    Allergies  Allergen Reactions  . Other Other (See Comments)    Blood pressure issues Per [facility] hospital: Pt states he can only take these pain meds or else he gets very sick: Oxycontin,morphine,demerol, and dilaudid are the only pain meds pt states he can take.    Marland Kitchen Oxycodone Other (See Comments)    Cases extreme changes in Blood pressure Cases extreme changes in Blood pressure   . Oxycodone-Acetaminophen Other (See Comments)    Blood pressure issues, rash from acetaminophen, pt states he can take tylenol though.  . Oxymorphone Other (See Comments)    Causes kidney problems  . Aspirin Nausea And Vomiting    Per [facility]   . Beta Vulgaris Other (See Comments)  . Buspirone Other (See Comments)  . Cabbage Other (See Comments)  . Codeine Other (See Comments)  . Fish Allergy Other (See Comments)  . Fish-Derived Products   . Pentazocine   . Propoxyphene Other (See Comments)  . Shellfish Allergy Other (See Comments)  . Shellfish-Derived Products   . Sulfa Antibiotics Hives  . Sulfasalazine Other (See Comments)  . Amoxicillin Rash    Per [facility]   Thank you for allowing pharmacy to be part of this patients care team.  Nicole Cella, RPh Clinical Pharmacist 09/19/2019 9:33 AM

## 2019-09-19 NOTE — Evaluation (Signed)
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 Patient transferred from [facility] for MRSA bacteremia. CT abdomen showing discitis involvling T10, T11 area.   Clinical Impression   Patient presenting with decreased I in self care, balance, functional mobility/transfers, endurance, safety awareness, cognition, and strength. Patient reports being mod I PTA. Patient currently functioning at +2 assistance for standing tasks and side stepping, min A UB self care, Min A LB self care from bed level. Patient will benefit from acute OT to increase overall independence in the areas of ADLs, functional mobility, and safety awareness in order to safely discharge to next venue of care.    Follow Up Recommendations  SNF    Equipment Recommendations  Other (comment)(defer to next venue of care)       Precautions / Restrictions Precautions Precautions: Fall Precaution Comments: contact precautions MRSA Restrictions Weight Bearing Restrictions: No      Mobility Bed Mobility Overal bed mobility: Needs Assistance Bed Mobility: Rolling;Sidelying to Sit;Sit to Sidelying Rolling: Modified independent (Device/Increase time) Sidelying to sit: Min guard     Sit to sidelying: Min guard General bed mobility comments: Min guard for safety, often throwing himself back into sidelying or supine due to back pain, VC for technique and safety  Transfers Overall transfer level: Needs assistance Equipment used: Rolling walker (2 wheeled) Transfers: Sit to/from Stand Sit to Stand: Min assist;+2 safety/equipment;From elevated surface         General transfer comment: MinAx2  for boost to standing and for balance, prosthetic too loose as he has lost considerable weight    Balance Overall balance assessment: Needs assistance Sitting-balance support: Feet supported Sitting balance-[name]: Fair Sitting balance - Comments: min guard for safety, often throwing self back on bed due to pain     Standing balance-[name]: Poor Standing balance comment: reliant on B UE support          ADL either performed or assessed with clinical judgement   ADL Overall ADL's : Needs assistance/impaired     Grooming: Wash/dry hands;Wash/dry face;Oral care;Applying deodorant;Minimal assistance   Upper Body Bathing: Bed level;Minimal assistance   Lower Body Bathing: Bed level;Minimal assistance   Upper Body Dressing : Set up;Bed level   Lower Body Dressing: Sitting/lateral leans;Bed level;Minimal assistance          General ADL Comments: rolling L <> R in bed to don LB clothing this session.     Vision Baseline Vision/History: Wears glasses Wears Glasses: At all times Patient Visual Report: No change from baseline              Pertinent Vitals/Pain Pain Assessment: Faces Faces Pain Scale: Hurts whole lot Pain Location: back Pain Descriptors / Indicators: Grimacing;Guarding;Moaning Pain Intervention(s): Limited activity within patient's tolerance;Monitored during session;Premedicated before session;Repositioned     Hand Dominance Right   Extremity/Trunk Assessment Upper Extremity Assessment Upper Extremity Assessment: Generalized weakness   Lower Extremity Assessment Lower Extremity Assessment: RLE deficits/detail;LLE deficits/detail RLE Deficits / Details: moves leg on his own, but attempts at stretching increase muscle spasms in back RLE Sensation: decreased light touch LLE Deficits / Details: limited by pain, note wearing prevolon boot on foot; lifts antigravity with pain in  back       Communication Communication Communication: No  difficulties   Cognition Arousal/Alertness: Awake/alert Behavior During Therapy: Impulsive;Anxious Overall Cognitive Status: No family/caregiver present to determine baseline cognitive functioning Area of Impairment: Orientation;Attention;Memory;Following commands;Awareness;Problem solving;Safety/judgement                 Orientation Level: Disoriented to;Time Current Attention Level: Selective Memory: Decreased short-term memory;Decreased recall of precautions Following Commands: Follows one step commands consistently;Follows one step commands with increased time Safety/Judgement: Decreased awareness of safety;Decreased awareness of deficits Awareness: Intellectual Problem Solving: Requires verbal cues;Difficulty sequencing General Comments: very all over the place cognition wise today, poor understanding of sequencing and safety, at EOS stated "what's on my foot????" despite having sock put on during session and trying to put non slip sock over R residual limb              Home Living Family/patient expects to be discharged to:: Skilled nursing facility Living Arrangements: Alone Available Help at Discharge: Neighbor;Available PRN/intermittently Type of Home: House Home Access: Stairs to enter [employer] of Steps: 1   Home Layout: One level     Bathroom Shower/Tub: Teacher, early years/pre: Standard     Home Equipment: Cane - single point;Crutches   Additional Comments: Patient reports floors are not completely level, and that carpeting will not allow him to use wheelchair or walker.      Prior Functioning/Environment Level of Independence: Independent with assistive device(s)        Comments: patient reports ambulating with a cane and a crutch for 3 months. Reports just received R LE prosthesis prior to admission        OT Problem List: Decreased strength;Decreased activity tolerance;Pain;Decreased coordination;Decreased  cognition;Decreased safety awareness;Impaired balance (sitting and/or standing);Decreased knowledge of use of DME or AE      OT Treatment/Interventions: Self-care/ADL training;Therapeutic exercise;DME and/or AE instruction;Therapeutic activities;Patient/family education;Balance training;Manual therapy;Energy conservation;Cognitive remediation/compensation    OT Goals(Current goals can be found in the care plan section) Acute Rehab OT Goals Patient Stated Goal: to move when pain controlled OT Goal Formulation: With patient Time For Goal Achievement: 10/03/19 Potential to Achieve Goals: Good ADL Goals Pt Will Perform Lower Body Bathing: with supervision Pt Will Perform Lower Body Dressing: with set-up;sitting/lateral leans Pt Will Transfer to Toilet: squat pivot transfer;with min guard assist Pt Will Perform Toileting - Clothing Manipulation and hygiene: with min guard assist  OT Frequency: Min 2X/week   Barriers to D/C:    Pt does not have 24/7 S or Assist       Co-evaluation PT/OT/SLP Co-Evaluation/Treatment: Yes Reason for Co-Treatment: Complexity of the patient's impairments (multi-system involvement);Necessary to address cognition/behavior during functional activity;For patient/therapist safety;To address functional/ADL transfers PT goals addressed during session: Mobility/safety with mobility;Balance;Proper use of DME;Strengthening/ROM OT goals addressed during session: ADL's and self-care;Proper use of Adaptive equipment and DME      AM-PAC OT "6 Clicks" Daily Activity     Outcome Measure Help from another person eating meals?: None Help from another person taking care of personal grooming?: A Little Help from another person toileting, which includes using toliet, bedpan, or urinal?: A Lot Help from another person bathing (including washing, rinsing, drying)?: A Lot Help from another person to put on and taking off regular upper body clothing?: A Little Help from another  person to put on and taking off regular lower body clothing?: A Lot 6 Click Score: 16   End of Session Equipment Utilized During Treatment: Rolling walker  Nurse Communication: Patient requests pain meds  Activity Tolerance: Patient limited by lethargy;Patient limited by fatigue Patient left: in bed;with call bell/phone within reach;with bed alarm set  OT Visit Diagnosis: Pain Pain - part of body: (back)                Time: 1100-1136 OT Time Calculation (min): 36 min Charges:  OT General Charges $OT Visit: 1 Visit OT Evaluation $OT Eval Moderate Complexity: 1 Mod  Everlene Cunning P MS, OTR/L 09/19/2019, 1:00 PM

## 2019-09-20 LAB — GLUCOSE, CAPILLARY
Glucose-Capillary: 163 mg/dL — ABNORMAL HIGH (ref 70–99)
Glucose-Capillary: 167 mg/dL — ABNORMAL HIGH (ref 70–99)
Glucose-Capillary: 153 mg/dL — ABNORMAL HIGH (ref 70–99)
Glucose-Capillary: 169 mg/dL — ABNORMAL HIGH (ref 70–99)

## 2019-09-20 MED ORDER — DAPTOMYCIN IV (FOR PTA / DISCHARGE USE ONLY): 1000.0000 mg | INTRAVENOUS | 0 refills | Status: DC

## 2019-09-20 NOTE — Progress Notes (Signed)
NCM made aware by [name] liaison they would not  be able to to extend bed offer 2/2 pt requiring Daptomycin. NCM to make MD aware. Whitman Hero RN,BSN,CM [phone]

## 2019-09-20 NOTE — Progress Notes (Addendum)
Patient refusing gabapentin. Patient states that he "can only take Neurontin." Nurse explained that the gabapentin and Neurontin are the same medication and that he has taken the medication during his admission several times (per [facility]). Patient was adamant that the two are separate and that "gabapentin caused [him] to bleed/have intestinal trouble." Patient can identify the gabapentin capsule and will refuse it.   Returned the evening dose of gabapentin, but the nurse found a previous dose of gabapentin in the patient's sheets.

## 2019-09-20 NOTE — Progress Notes (Signed)
PHARMACY CONSULT NOTE FOR: NH administration of Daptomycin  OUTPATIENT  PARENTERAL ANTIBIOTIC THERAPY (OPAT)  Indication: MRSA bacteremia and discitis Regimen: Daptomycin 1000mg  IV q 24h End date: 10/26/2019  IV antibiotic discharge orders are pended. To discharging provider:  please sign these orders via discharge navigator,  Select New Orders & click on the button choice - Manage This Unsigned Work.    D/c Dalbivancin since patient is not being discharged to home.  Thank you for allowing pharmacy to be a part of this patient's care.  Nashanti Duquette S. Alford Highland, PharmD, BCPS Clinical Staff Pharmacist Eilene Ghazi Stillinger 09/20/2019, 9:33 AM

## 2019-09-20 NOTE — Progress Notes (Signed)
PROGRESS NOTE    Thomas [name]  [id number] DOB: 1951-08-21 DOA: 08/31/2019 PCP: Antonietta Jewel, MD   Brief Narrative: Thomas [name] is a 69 y.o. malewithhistory of diabetes mellitus type 2, hypertension, chronic kidney disease stage III, anemia who was admitted at [facility] after having fever chills and back pain. 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    Assessment & Plan:   Principal Problem:   MRSA bacteremia Active Problems:   [name], right ([place])   [name], type 2) ([place])   HTN (hypertension)   Discitis   Pressure injury of skin   T10-T11 discitis MRSA bacteremia Left lower extremity wound In setting of chronic left foot wound. Patient treated with IV daptomycin. Transthoracic Echocardiogram at OSH significant for no vegetation. Recommendation for Transesophageal Echocardiogram at 4-6 weeks as an outpatient. Repeat CT scan still significant for discitis/osteomyelitis of T10-T11 with limited assessment for epidural abscess. Patient states he cannot get MRIs -ID recommendations: Daptomycin while inpatient; recommendation for Dalbavancin on discharge IF discharging home (not an option at this time). Discussed changing to Dalbavancin on SNF discharge, which would not be optimal per ID -CRP in AM  Chronic pain syndrome Patient follows with a pain clinic. He is on Oxycontin and Neurontin as an outpatient. Started on hydromorphone impatient. -Continue Oxycontin, hydromorphone, gabapentin,   Diabetes mellitus, type 2 On glipizide as an outpatient -Continue SSI  AKI on CKD stage IV Baseline creatinine of about 2.7. Peak creatinine of 3.23. currently back to baseline.  Hypothyroidism -Continue Synthroid  Essential hypertension -Continue amlodipine, clonidine, hydralazine, metoprolol  Hyperkalemia Likely secondary to AKI. Given Lokelma. Resolved.  Anemia of chronic disease Baseline creatinine of about 9 to 10.  Stable.  Back pain Acute on chronic. Management above  Hiccups Resolved.  History of BKA Stable.  Pressure injury Left heel/foot, POA   DVT prophylaxis: Heparin Code Status:   Code Status: Full Code Family Communication: None at bedside Disposition Plan: Discharge pending improved pain control. Planning for SNF, however, complicated by Daptomycin cost   Consultants:   Infectious disease  Procedures:   None  Antimicrobials:  Daptomycin    Subjective: Pain is improved from previous. He did somewhat well with therapy but had significant pain afterwards. He is motivated to be more mobile and states "no pain, no gain."   Objective: Vitals:   09/19/19 2101 09/20/19 0508 09/20/19 0942 09/20/19 1327  BP: 121/75 126/84 94/63 114/74  Pulse: 65 60 68   Resp: 20     Temp: 98.6 F (37 C) 98.6 F (37 C)    TempSrc:  Oral    SpO2: 96% 98% 97%   Weight:  109.6 kg    Height:        Intake/Output Summary (Last 24 hours) at 09/20/2019 1346 Last data filed at 09/20/2019 0900 Gross per 24 hour  Intake 2479.51 ml  Output -  Net 2479.51 ml   Filed Weights   09/17/19 0524 09/18/19 0456 09/20/19 0508  Weight: 111.8 kg 108.2 kg 109.6 kg    Examination:  General exam: Appears calm and comfortable Respiratory system: Clear to auscultation. Respiratory effort normal. Cardiovascular system: S1 & S2 heard, RRR. No murmurs, rubs, gallops or clicks. Gastrointestinal system: Abdomen is nondistended, soft and nontender. No organomegaly or masses felt. Normal bowel sounds heard. Central nervous system: Alert and oriented. No focal neurological deficits. Extremities: No edema. No calf tenderness. Right BKA Skin: No cyanosis. No rashes Psychiatry:  Judgement and insight appear normal. Mood & affect appropriate.     Data Reviewed: I have personally reviewed following labs and imaging studies  CBC: Recent Labs  Lab 09/14/19 0312 09/17/19 1449  WBC 5.8 5.8  NEUTROABS 3.1  --    HGB 8.8* 8.9*  HCT 26.9* 27.5*  MCV 93.7 93.2  PLT 239 99991111   Basic Metabolic Panel: Recent Labs  Lab 09/14/19 0312 09/17/19 1449 09/19/19 1242  NA 135 137 137  K 4.8 5.1 4.9  CL 106 106 105  CO2 19* 23 24  GLUCOSE 146* 138* 157*  BUN 54* 53* 47*  CREATININE 2.88* 2.99* 3.18*  CALCIUM 8.6* 9.1 9.1  PHOS 6.0*  --   --    GFR: Estimated Creatinine Clearance: 29.5 mL/min (A) (by C-G formula based on SCr of 3.18 mg/dL (H)). Liver Function Tests: Recent Labs  Lab 09/14/19 0312  ALBUMIN 2.3*   No results for input(s): LIPASE, AMYLASE in the last 168 hours. No results for input(s): AMMONIA in the last 168 hours. Coagulation Profile: No results for input(s): INR, PROTIME in the last 168 hours. Cardiac Enzymes: Recent Labs  Lab 09/15/19 0413  CKTOTAL 92   BNP (last 3 results) No results for input(s): PROBNP in the last 8760 hours. HbA1C: No results for input(s): HGBA1C in the last 72 hours. CBG: Recent Labs  Lab 09/19/19 1155 09/19/19 1701 09/19/19 2059 09/20/19 0756 09/20/19 1138  GLUCAP 185* 159* 192* 163* 167*   Lipid Profile: No results for input(s): CHOL, HDL, LDLCALC, TRIG, CHOLHDL, LDLDIRECT in the last 72 hours. Thyroid Function Tests: No results for input(s): TSH, T4TOTAL, FREET4, T3FREE, THYROIDAB in the last 72 hours. Anemia Panel: No results for input(s): VITAMINB12, FOLATE, FERRITIN, TIBC, IRON, RETICCTPCT in the last 72 hours. Sepsis Labs: No results for input(s): PROCALCITON, LATICACIDVEN in the last 168 hours.  No results found for this or any previous visit (from the past 240 hour(s)).       Radiology Studies: No results found.      Scheduled Meds: . allopurinol  100 mg Oral BID  . amLODipine  10 mg Oral Daily  . Chlorhexidine Gluconate Cloth  6 each Topical Daily  . cloNIDine  0.3 mg Oral Q8H  . diclofenac  1 patch Transdermal BID  . ferrous sulfate  325 mg Oral BID WC  . finasteride  5 mg Oral Daily  . gabapentin  300 mg  Oral TID  . heparin  5,000 Units Subcutaneous Q8H  . hydrALAZINE  50 mg Oral Q8H  . insulin aspart  0-9 Units Subcutaneous TID WC  . levothyroxine  50 mcg Oral Q0600  . metoprolol tartrate  50 mg Oral BID  . oxyCODONE  20 mg Oral Q8H  . sodium bicarbonate  650 mg Oral Daily  . sorbitol  15 mL Oral Daily  . tamsulosin  0.4 mg Oral Daily  . vitamin B-12  2,500 mcg Oral Daily   Continuous Infusions: . sodium chloride 1,000 mL (09/16/19 1820)  . DAPTOmycin (CUBICIN)  IV 1,000 mg (09/19/19 2254)     LOS: 20 days     Cordelia Poche, MD [facility] 09/20/2019, 1:46 PM  If 7PM-7AM, please contact night-coverage [email or web address]

## 2019-09-21 LAB — BASIC METABOLIC PANEL WITH GFR
Anion gap: 9 (ref 5–15)
BUN: 50 mg/dL — ABNORMAL HIGH (ref 8–23)
CO2: 23 mmol/L (ref 22–32)
Calcium: 8.9 mg/dL (ref 8.9–10.3)
Chloride: 103 mmol/L (ref 98–111)
Creatinine, Ser: 3.49 mg/dL — ABNORMAL HIGH (ref 0.61–1.24)
GFR calc Af Amer: 20 mL/min — ABNORMAL LOW
GFR calc non Af Amer: 17 mL/min — ABNORMAL LOW
Glucose, Bld: 170 mg/dL — ABNORMAL HIGH (ref 70–99)
Potassium: 4.9 mmol/L (ref 3.5–5.1)
Sodium: 135 mmol/L (ref 135–145)

## 2019-09-21 LAB — CBC
HCT: 25.7 % — ABNORMAL LOW (ref 39.0–52.0)
Hemoglobin: 8.1 g/dL — ABNORMAL LOW (ref 13.0–17.0)
MCH: 30.2 pg (ref 26.0–34.0)
MCHC: 31.5 g/dL (ref 30.0–36.0)
MCV: 95.9 fL (ref 80.0–100.0)
Platelets: 180 10*3/uL (ref 150–400)
RBC: 2.68 MIL/uL — ABNORMAL LOW (ref 4.22–5.81)
RDW: 15.9 % — ABNORMAL HIGH (ref 11.5–15.5)
WBC: 6.1 10*3/uL (ref 4.0–10.5)
nRBC: 0 % (ref 0.0–0.2)

## 2019-09-21 LAB — GLUCOSE, CAPILLARY
Glucose-Capillary: 187 mg/dL — ABNORMAL HIGH (ref 70–99)
Glucose-Capillary: 185 mg/dL — ABNORMAL HIGH (ref 70–99)
Glucose-Capillary: 142 mg/dL — ABNORMAL HIGH (ref 70–99)
Glucose-Capillary: 165 mg/dL — ABNORMAL HIGH (ref 70–99)

## 2019-09-21 LAB — C-REACTIVE PROTEIN: CRP: 4.4 mg/dL — ABNORMAL HIGH (ref <1.0)

## 2019-09-21 MED ORDER — BISACODYL 10 MG RE SUPP
10.0000 mg | Freq: Every day | RECTAL | Status: DC | PRN
  Administered 2019-09-22 – 2019-09-24 (×2): 10 mg via RECTAL
  Filled 2019-09-21 (×2): qty 1

## 2019-09-21 MED ORDER — POLYETHYLENE GLYCOL 3350 17 G PO PACK: 17.0000 g | PACK | Freq: Every day | ORAL | Status: DC | PRN

## 2019-09-21 MED ORDER — SENNOSIDES-DOCUSATE SODIUM 8.6-50 MG PO TABS
1.0000 | ORAL_TABLET | Freq: Two times a day (BID) | ORAL | Status: DC
  Administered 2019-09-21 – 2019-10-08 (×30): 1 via ORAL
  Filled 2019-09-21 (×36): qty 1

## 2019-09-21 NOTE — Plan of Care (Signed)
  Problem: Education: Goal: Knowledge of General Education information will improve Description: Including pain rating scale, medication(s)/side effects and non-pharmacologic comfort measures Outcome: Not Progressing   Problem: Health Behavior/Discharge Planning: Goal: Ability to manage health-related needs will improve Outcome: Not Progressing   Problem: Activity: Goal: Risk for activity intolerance will decrease Outcome: Not Progressing   Problem: Nutrition: Goal: Adequate nutrition will be maintained Outcome: Not Progressing   Problem: Coping: Goal: Level of anxiety will decrease Outcome: Not Progressing   Problem: Pain Managment: Goal: General experience of comfort will improve Outcome: Not Progressing   Problem: Safety: Goal: Ability to remain free from injury will improve Outcome: Not Progressing   Problem: Skin Integrity: Goal: Risk for impaired skin integrity will decrease Outcome: Not Progressing

## 2019-09-21 NOTE — Progress Notes (Signed)
Patient ID: Thomas [name], male   DOB: January 04, 1951, 68 y.o.   MRN: [id number]  PROGRESS NOTE    Thomas [name]  [id number] DOB: 1951/02/25 DOA: 08/31/2019 PCP: Antonietta Jewel, MD   Brief Narrative:  68 year old male with history of abdominal distention, hypertension, chronic kidney disease stage III, anemia was initially admitted to [facility] for fever, chills and back pain.  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  He was transferred to [facility] for the same.  2D echo at outside hospital showed no vegetation.  ID recommended daptomycin while inpatient and dalbavancin on discharge if discharging home.  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  Patient states he cannot get MRIs -ID recommendations: Daptomycin while inpatient till 10/26/2019; recommendation for dalbavancin on discharge if discharging home (not an option at this time). -SNF refused the patient because of patient requiring daptomycin..  Prior hospitalist had discussed with ID regarding dalbavancin on SNF discharge, which would not be optimal per ID. -Check CK levels weekly  Chronic pain syndrome -Patient follows up with the pain clinic.  He is on OxyContin and Neurontin as an outpatient. -Started on hydromorphone inpatient -Continue OxyContin, hydromorphone, gabapentin.  Continue bowel regimen  Diabetes mellitus type 2 -On glipizide as an outpatient -Continue CBGs with SSI  Acute kidney injury on chronic kidney disease stage IV -Baseline creatinine around 2.7.  Today is 2.49.  Monitor.  Will check CK level in a.m.  Hypothyroidism -Continue  Synthroid  Essential hypertension -Continue amlodipine, clonidine, hydralazine, metoprolol  Anemia of chronic disease--stable  Left heel/foot unstageable pressure ulcer: Present on admission -Continue wound care  DVT prophylaxis: Heparin Code Status: Full Family Communication: None at bedside Disposition Plan: Might need to stay inpatient to complete IV antibiotics.  Consultants: ID  Procedures: None  Antimicrobials:  Anti-infectives (From admission, onward)   Start     Dose/Rate Route Frequency Ordered Stop   09/20/19 0000  daptomycin (CUBICIN) IVPB     1,000 mg Intravenous Every 24 hours 09/20/19 0936 10/26/19 2359   09/14/19 0000  dalbavancin (DALVANCE) 500 MG SOLR  Status:  Discontinued        09/14/19 1306 09/20/19    09/09/19 0000  daptomycin (CUBICIN) IVPB  Status:  Discontinued     1,000 mg Intravenous Every 24 hours 09/09/19 0833 09/14/19    09/08/19 0000  daptomycin (CUBICIN) IVPB  Status:  Discontinued     1,000 mg Intravenous Every 24 hours 09/08/19 0922 09/09/19    09/02/19 2000  DAPTOmycin (CUBICIN) 1,000 mg in sodium chloride 0.9 % IVPB  Status:  Discontinued     1,000 mg 240 mL/hr over 30 Minutes Intravenous Every 48 hours 08/31/19 1205 09/02/19 1206   09/02/19 2000  DAPTOmycin (CUBICIN) 1,000 mg in sodium chloride 0.9 % IVPB     1,000 mg 240 mL/hr over 30 Minutes Intravenous Every 24 hours 09/02/19 1206     08/31/19 1000  DAPTOmycin (CUBICIN) 750 mg in sodium chloride 0.9 % IVPB  Status:  Discontinued     750 mg 230 mL/hr over 30 Minutes Intravenous Every 48 hours 08/31/19 0414 08/31/19 1205       Subjective: Patient seen and examined at  bedside.  He denies worsening shortness of breath, fever, nausea or vomiting.  Still is having significant pain, mostly in the back.  Objective: Vitals:   09/20/19 1405 09/20/19 2138 09/21/19 0532 09/21/19 0818  BP: 128/76 116/73 118/75   Pulse: 67 60 (!) 59 66  Resp: 20 13 17    Temp: 98.4 F (36.9 C) 97.8 F  (36.6 C) 98.1 F (36.7 C)   TempSrc: Oral Oral Oral   SpO2: 100% 100% 99%   Weight:   110 kg   Height:        Intake/Output Summary (Last 24 hours) at 09/21/2019 1238 Last data filed at 09/21/2019 0900 Gross per 24 hour  Intake 1180.87 ml  Output 800 ml  Net 380.87 ml   Filed Weights   09/18/19 0456 09/20/19 0508 09/21/19 0532  Weight: 108.2 kg 109.6 kg 110 kg    Examination:  General exam: Appears calm and comfortable.  Looks older than stated age. Respiratory system: Bilateral decreased breath sounds at bases Cardiovascular system: S1 & S2 heard, intermittently bradycardic Gastrointestinal system: Abdomen is nondistended, soft and nontender. Normal bowel sounds heard. Extremities: No cyanosis, clubbing; trace lower extremity edema.  Right BKA present.   Data Reviewed: I have personally reviewed following labs and imaging studies  CBC: Recent Labs  Lab 09/17/19 1449 09/21/19 0316  WBC 5.8 6.1  HGB 8.9* 8.1*  HCT 27.5* 25.7*  MCV 93.2 95.9  PLT 205 99991111   Basic Metabolic Panel: Recent Labs  Lab 09/17/19 1449 09/19/19 1242 09/21/19 0316  NA 137 137 135  K 5.1 4.9 4.9  CL 106 105 103  CO2 23 24 23   GLUCOSE 138* 157* 170*  BUN 53* 47* 50*  CREATININE 2.99* 3.18* 3.49*  CALCIUM 9.1 9.1 8.9   GFR: Estimated Creatinine Clearance: 29.1 mL/min (A) (by C-G formula based on SCr of 3.49 mg/dL (H)). Liver Function Tests: No results for input(s): AST, ALT, ALKPHOS, BILITOT, PROT, ALBUMIN in the last 168 hours. No results for input(s): LIPASE, AMYLASE in the last 168 hours. No results for input(s): AMMONIA in the last 168 hours. Coagulation Profile: No results for input(s): INR, PROTIME in the last 168 hours. Cardiac Enzymes: Recent Labs  Lab 09/15/19 0413  CKTOTAL 92   BNP (last 3 results) No results for input(s): PROBNP in the last 8760 hours. HbA1C: No results for input(s): HGBA1C in the last 72 hours. CBG: Recent Labs  Lab 09/20/19 1138 09/20/19 1619  09/20/19 2138 09/21/19 0743 09/21/19 1152  GLUCAP 167* 153* 169* 187* 185*   Lipid Profile: No results for input(s): CHOL, HDL, LDLCALC, TRIG, CHOLHDL, LDLDIRECT in the last 72 hours. Thyroid Function Tests: No results for input(s): TSH, T4TOTAL, FREET4, T3FREE, THYROIDAB in the last 72 hours. Anemia Panel: No results for input(s): VITAMINB12, FOLATE, FERRITIN, TIBC, IRON, RETICCTPCT in the last 72 hours. Sepsis Labs: No results for input(s): PROCALCITON, LATICACIDVEN in the last 168 hours.  No results found for this or any previous visit (from the past 240 hour(s)).       Radiology Studies: No results found.      Scheduled Meds: . allopurinol  100 mg Oral BID  . amLODipine  10 mg Oral Daily  . Chlorhexidine Gluconate Cloth  6 each Topical Daily  . cloNIDine  0.3 mg Oral Q8H  . diclofenac  1 patch Transdermal BID  . ferrous sulfate  325 mg Oral BID WC  . finasteride  5 mg Oral Daily  . gabapentin  300 mg Oral  TID  . heparin  5,000 Units Subcutaneous Q8H  . hydrALAZINE  50 mg Oral Q8H  . insulin aspart  0-9 Units Subcutaneous TID WC  . levothyroxine  50 mcg Oral Q0600  . metoprolol tartrate  50 mg Oral BID  . oxyCODONE  20 mg Oral Q8H  . sodium bicarbonate  650 mg Oral Daily  . sorbitol  15 mL Oral Daily  . tamsulosin  0.4 mg Oral Daily  . vitamin B-12  2,500 mcg Oral Daily   Continuous Infusions: . sodium chloride 1,000 mL (09/16/19 1820)  . DAPTOmycin (CUBICIN)  IV Stopped (09/21/19 0753)          Aline August, MD [facility] 09/21/2019, 12:38 PM

## 2019-09-22 ENCOUNTER — Inpatient Hospital Stay: Payer: Medicare Other | Admitting: Family

## 2019-09-22 ENCOUNTER — Inpatient Hospital Stay (HOSPITAL_COMMUNITY): Payer: Medicare Other

## 2019-09-22 LAB — COMPREHENSIVE METABOLIC PANEL
ALT: 14 U/L (ref 0–44)
AST: 21 U/L (ref 15–41)
Albumin: 2.5 g/dL — ABNORMAL LOW (ref 3.5–5.0)
Alkaline Phosphatase: 81 U/L (ref 38–126)
Anion gap: 9 (ref 5–15)
BUN: 47 mg/dL — ABNORMAL HIGH (ref 8–23)
CO2: 24 mmol/L (ref 22–32)
Calcium: 8.6 mg/dL — ABNORMAL LOW (ref 8.9–10.3)
Chloride: 104 mmol/L (ref 98–111)
Creatinine, Ser: 3.58 mg/dL — ABNORMAL HIGH (ref 0.61–1.24)
GFR calc Af Amer: 19 mL/min — ABNORMAL LOW (ref >60)
GFR calc non Af Amer: 17 mL/min — ABNORMAL LOW (ref >60)
Glucose, Bld: 154 mg/dL — ABNORMAL HIGH (ref 70–99)
Potassium: 5 mmol/L (ref 3.5–5.1)
Sodium: 137 mmol/L (ref 135–145)
Total Bilirubin: 0.4 mg/dL (ref 0.3–1.2)
Total Protein: 6.6 g/dL (ref 6.5–8.1)

## 2019-09-22 LAB — CBC WITH DIFFERENTIAL/PLATELET
Abs Immature Granulocytes: 0.02 10*3/uL (ref 0.00–0.07)
Basophils Absolute: 0 10*3/uL (ref 0.0–0.1)
Basophils Relative: 1 %
Eosinophils Absolute: 0.3 10*3/uL (ref 0.0–0.5)
Eosinophils Relative: 5 %
HCT: 24.9 % — ABNORMAL LOW (ref 39.0–52.0)
Hemoglobin: 7.8 g/dL — ABNORMAL LOW (ref 13.0–17.0)
Immature Granulocytes: 0 %
Lymphocytes Relative: 29 %
Lymphs Abs: 1.6 10*3/uL (ref 0.7–4.0)
MCH: 30 pg (ref 26.0–34.0)
MCHC: 31.3 g/dL (ref 30.0–36.0)
MCV: 95.8 fL (ref 80.0–100.0)
Monocytes Absolute: 0.4 10*3/uL (ref 0.1–1.0)
Monocytes Relative: 7 %
Neutro Abs: 3.2 10*3/uL (ref 1.7–7.7)
Neutrophils Relative %: 58 %
Platelets: 166 10*3/uL (ref 150–400)
RBC: 2.6 MIL/uL — ABNORMAL LOW (ref 4.22–5.81)
RDW: 15.9 % — ABNORMAL HIGH (ref 11.5–15.5)
WBC: 5.6 10*3/uL (ref 4.0–10.5)
nRBC: 0 % (ref 0.0–0.2)

## 2019-09-22 LAB — CK: Total CK: 68 U/L (ref 49–397)

## 2019-09-22 LAB — GLUCOSE, CAPILLARY
Glucose-Capillary: 213 mg/dL — ABNORMAL HIGH (ref 70–99)
Glucose-Capillary: 118 mg/dL — ABNORMAL HIGH (ref 70–99)
Glucose-Capillary: 178 mg/dL — ABNORMAL HIGH (ref 70–99)
Glucose-Capillary: 154 mg/dL — ABNORMAL HIGH (ref 70–99)

## 2019-09-22 LAB — MAGNESIUM: Magnesium: 1.8 mg/dL (ref 1.7–2.4)

## 2019-09-22 IMAGING — US US RENAL
1 series · 14 of 25 positions shown · non-contrast
Comparison: CT, [DATE].

CLINICAL DATA: Acute kidney injury.

EXAM:
RENAL / URINARY TRACT ULTRASOUND COMPLETE

[Series 1: us renal · 14 of 33 slices shown]
[im 1/33]
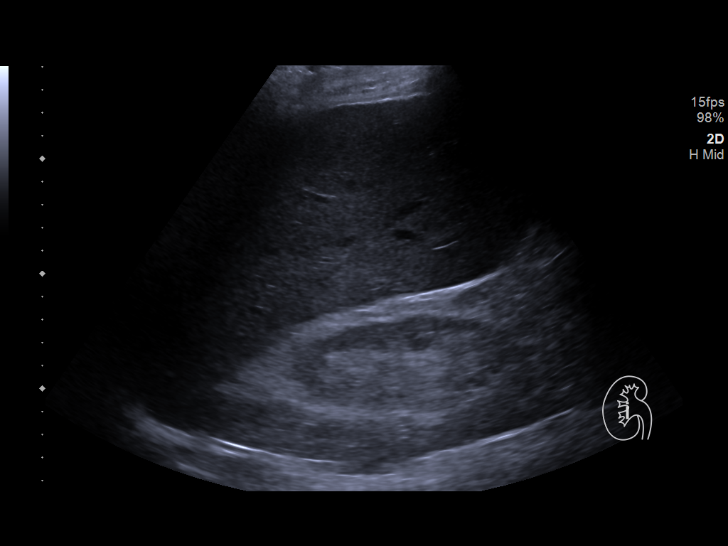
[im 3/33]
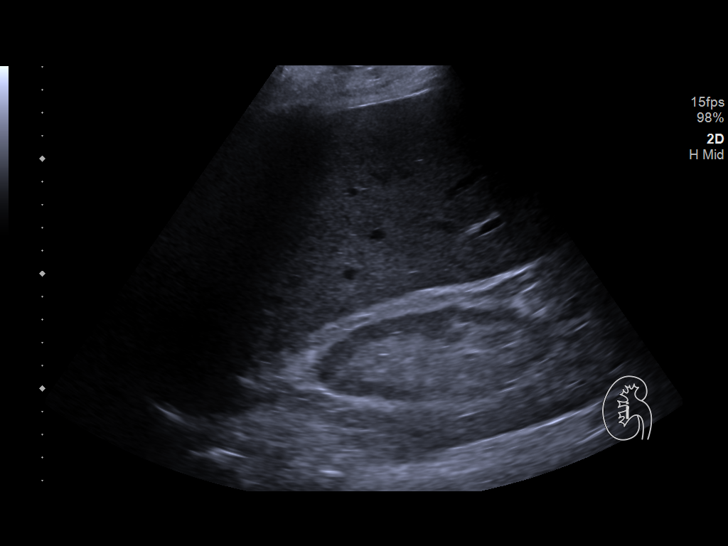
[im 6/33]
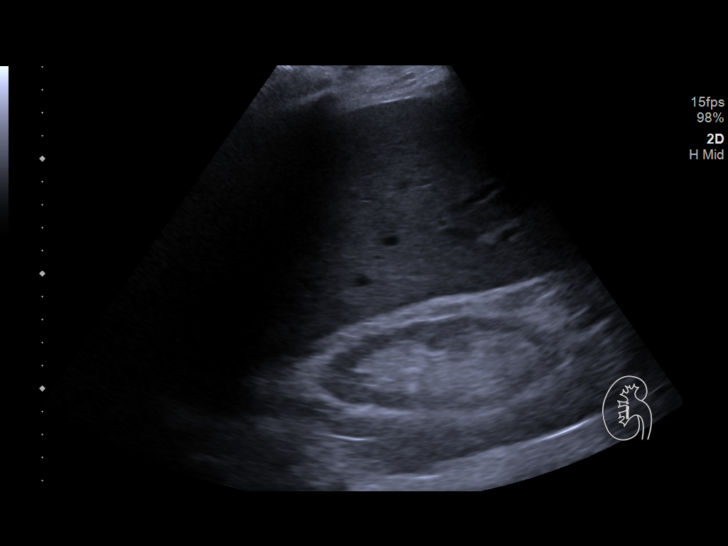
[im 9/33]
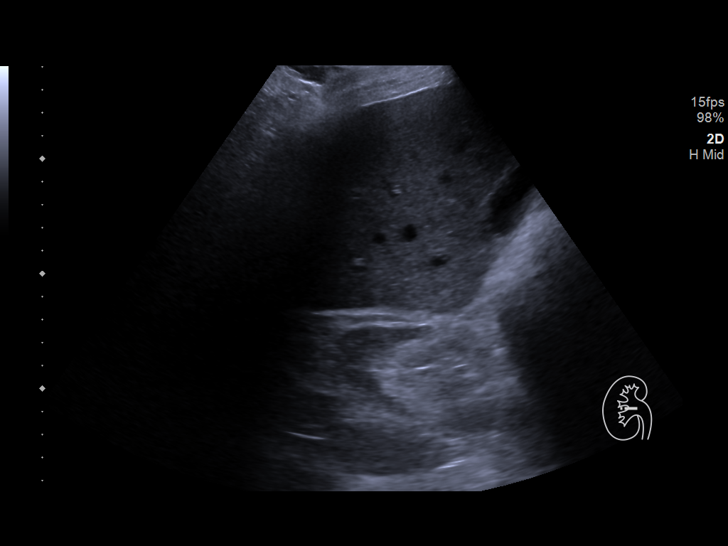
[im 11/33]
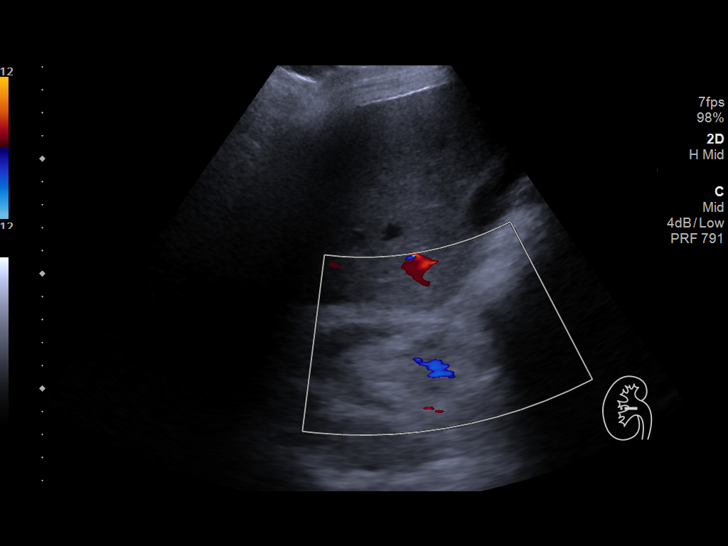
[im 13/33]
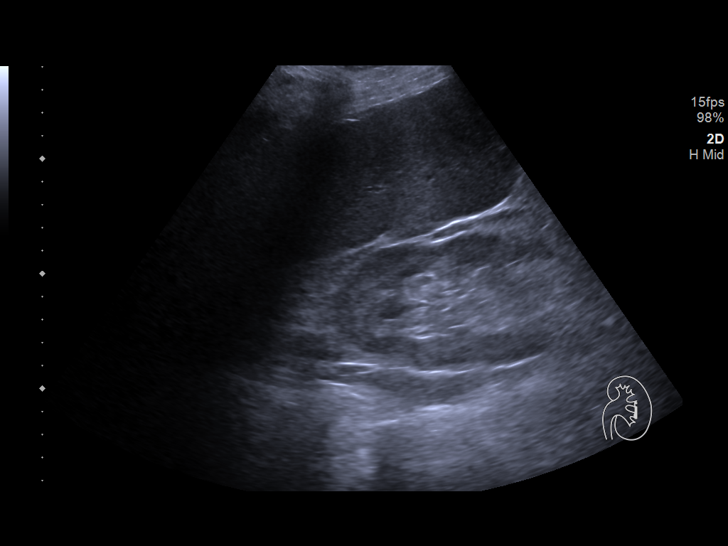
[im 15/33]
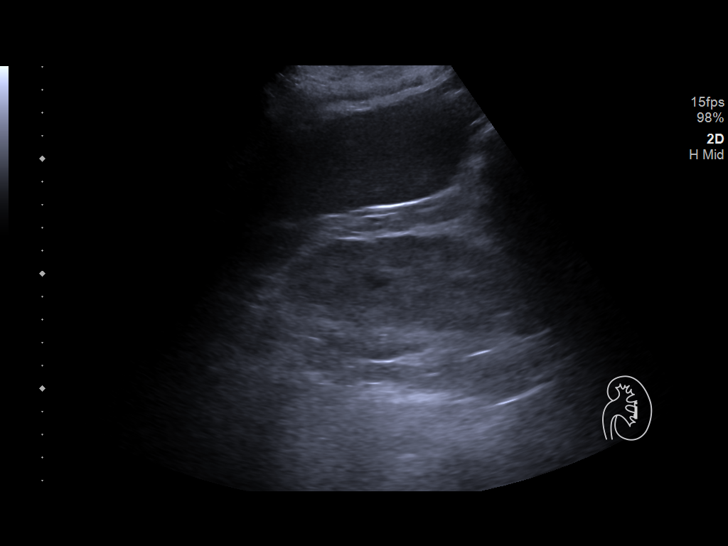
[im 18/33]
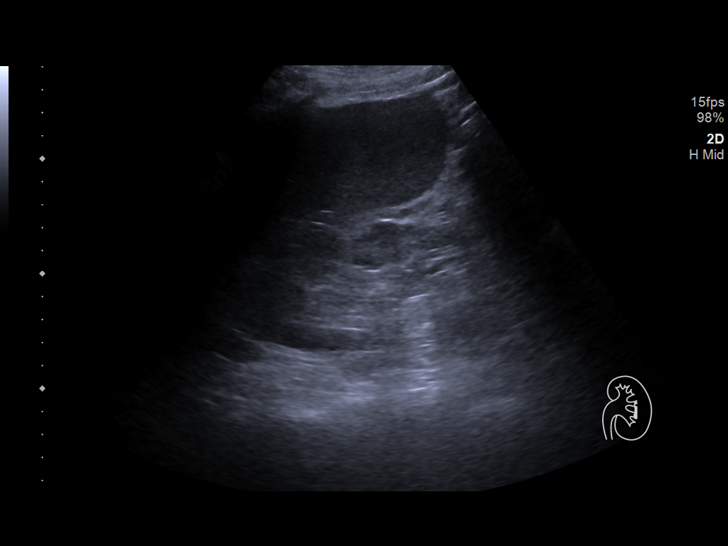
[im 21/33]
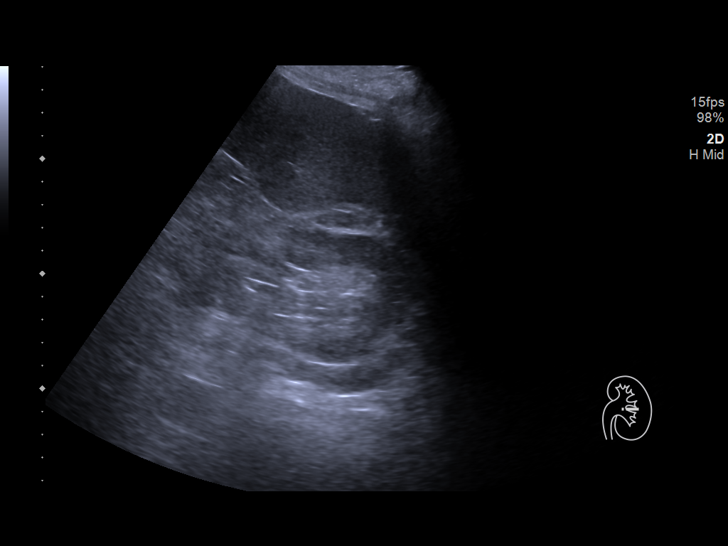
[im 22/33]
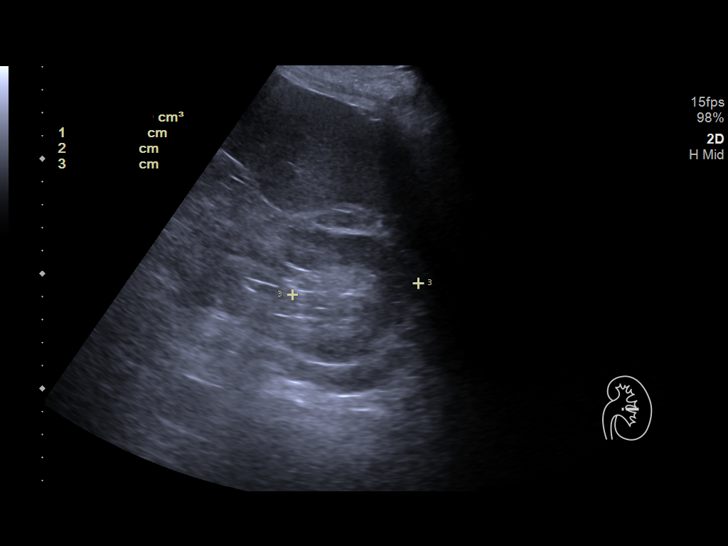
[im 25/33]
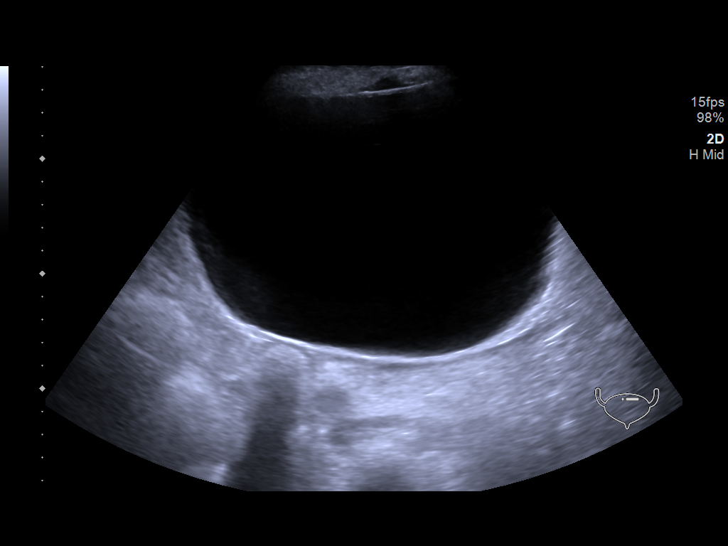
[im 27/33]
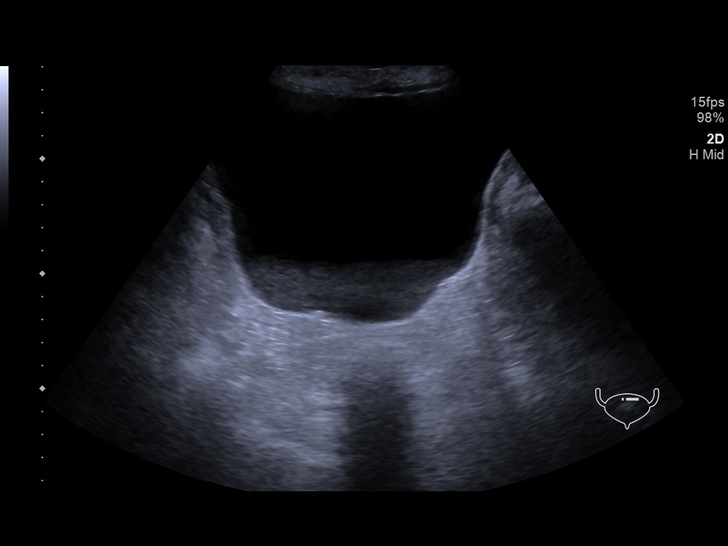
[im 30/33]
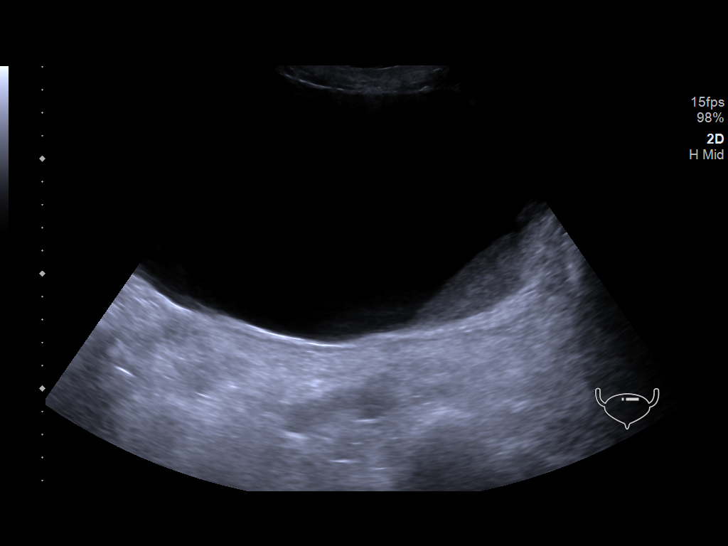
[im 33/33]
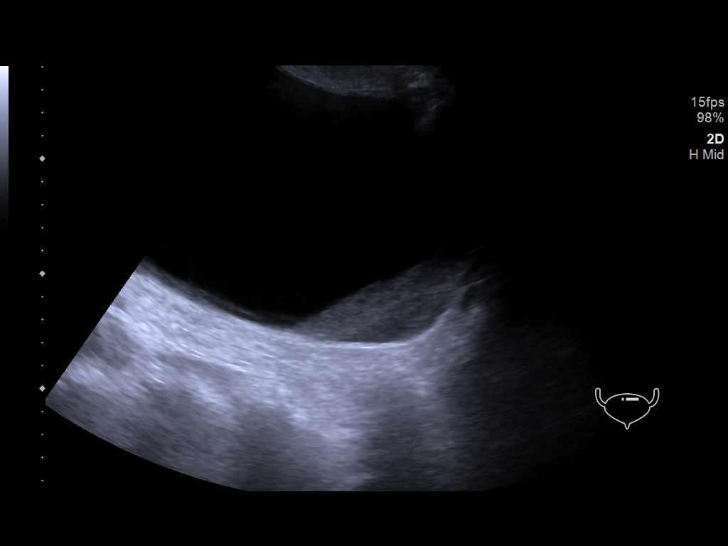

[14 of 25 positions shown; findings below may reference images not displayed]

FINDINGS: Right Kidney:

Renal measurements: 10.5 x 4.1 x 4.3 cm = volume: 94.9 mL.
Borderline increased parenchymal echogenicity. Diffuse renal
cortical thinning. No mass, stone or hydronephrosis.

Left Kidney:

Renal measurements: 14.9 x 5.8 x 5.5 cm = volume: 249.9 mL.
Borderline increased renal parenchymal echogenicity. No renal mass
or stone. No hydronephrosis.

Bladder:

Dependent debris.  No wall thickening or masses.

Other:

None.
IMPRESSION: 1. Right renal atrophy. Borderline increased renal parenchymal
echogenicity bilaterally suggesting medical renal disease.
2. No acute findings.  No hydronephrosis.
3. Dependent debris in the bladder, but no wall thickening.

## 2019-09-22 MED ORDER — SODIUM CHLORIDE 0.9 % IV SOLN
1000.0000 mg | INTRAVENOUS | Status: DC
  Administered 2019-09-23: 1000 mg via INTRAVENOUS
  Filled 2019-09-22 (×2): qty 20

## 2019-09-22 MED ORDER — PRO-STAT SUGAR FREE PO LIQD
30.0000 mL | Freq: Two times a day (BID) | ORAL | Status: DC
  Administered 2019-09-22 – 2019-09-26 (×8): 30 mL via ORAL
  Filled 2019-09-22 (×10): qty 30

## 2019-09-22 MED ORDER — SODIUM CHLORIDE 0.9 % IV SOLN
INTRAVENOUS | Status: DC
  Administered 2019-09-22 – 2019-09-24 (×5): via INTRAVENOUS

## 2019-09-22 MED ORDER — GLUCERNA SHAKE PO LIQD
237.0000 mL | Freq: Three times a day (TID) | ORAL | Status: DC
  Administered 2019-09-22 – 2019-09-28 (×8): 237 mL via ORAL

## 2019-09-22 NOTE — Progress Notes (Addendum)
Patient to be transported to 6N bed 16. Report given to [facility]. Patient belongings sent with him: dentures, prosthesis, clothing, boot, and cell phone.

## 2019-09-22 NOTE — Plan of Care (Signed)
  Problem: Education: Goal: Knowledge of General Education information will improve Description Including pain rating scale, medication(s)/side effects and non-pharmacologic comfort measures Outcome: Progressing   

## 2019-09-22 NOTE — Progress Notes (Signed)
Patient ID: Thomas [name], male   DOB: 1950-12-14, 68 y.o.   MRN: [id number]  PROGRESS NOTE    ABUBAKARR BROXTERMAN  [id number] DOB: 1951/08/16 DOA: 08/31/2019 PCP: Antonietta Jewel, MD   Brief Narrative:  68 year old male with history of abdominal distention, hypertension, chronic kidney disease stage III, anemia was initially admitted to [facility] for fever, chills and back pain.  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  He was transferred to [facility] for the same.  2D echo at outside hospital showed no vegetation.  ID recommended daptomycin while inpatient and dalbavancin on discharge if discharging home.  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  Patient states he cannot get MRIs -ID recommendations: Daptomycin while inpatient till 10/26/2019; recommendation for dalbavancin on discharge if discharging home (not an option at this time). -SNF refused the patient because of patient requiring daptomycin..  Prior hospitalist had discussed with ID regarding dalbavancin on SNF discharge, which would not be optimal per ID. -Check CK levels weekly  Chronic pain syndrome -Patient follows up with the pain clinic.  He is on OxyContin and Neurontin as an outpatient. -Started on hydromorphone inpatient -Continue OxyContin, hydromorphone, gabapentin.  Continue bowel regimen  Diabetes mellitus type 2 -On glipizide as an outpatient -Continue CBGs with SSI  Acute kidney injury on chronic kidney disease stage IV -Baseline creatinine around 2.7.  Today is 3.58.  Will give gentle IV hydration.  CK total is 68 today.  Hypothyroidism  -Continue Synthroid  Essential hypertension -Continue amlodipine, clonidine, hydralazine, metoprolol  Anemia of chronic disease--stable  Left heel/foot unstageable pressure ulcer: Present on admission -Continue wound care  DVT prophylaxis: Heparin Code Status: Full Family Communication: None at bedside Disposition Plan: Might need to stay inpatient to complete IV antibiotics.  Consultants: ID  Procedures: None  Antimicrobials:  Anti-infectives (From admission, onward)   Start     Dose/Rate Route Frequency Ordered Stop   09/20/19 0000  daptomycin (CUBICIN) IVPB     1,000 mg Intravenous Every 24 hours 09/20/19 0936 10/26/19 2359   09/14/19 0000  dalbavancin (DALVANCE) 500 MG SOLR  Status:  Discontinued        09/14/19 1306 09/20/19    09/09/19 0000  daptomycin (CUBICIN) IVPB  Status:  Discontinued     1,000 mg Intravenous Every 24 hours 09/09/19 0833 09/14/19    09/08/19 0000  daptomycin (CUBICIN) IVPB  Status:  Discontinued     1,000 mg Intravenous Every 24 hours 09/08/19 0922 09/09/19    09/02/19 2000  DAPTOmycin (CUBICIN) 1,000 mg in sodium chloride 0.9 % IVPB  Status:  Discontinued     1,000 mg 240 mL/hr over 30 Minutes Intravenous Every 48 hours 08/31/19 1205 09/02/19 1206   09/02/19 2000  DAPTOmycin (CUBICIN) 1,000 mg in sodium chloride 0.9 % IVPB     1,000 mg 240 mL/hr over 30 Minutes Intravenous Every 24 hours 09/02/19 1206     08/31/19 1000  DAPTOmycin (CUBICIN) 750 mg in sodium chloride 0.9 % IVPB  Status:  Discontinued     750 mg 230 mL/hr over 30 Minutes Intravenous Every 48 hours 08/31/19 0414 08/31/19 1205       Subjective: Patient seen  and examined at bedside.  No worsening shortness of breath, fever, nausea or vomiting.  Still has significant back pain.  Complains of constipation and is requesting for suppository.  Objective: Vitals:   09/21/19 2134 09/21/19 2148 09/22/19 0547 09/22/19 0654  BP: 118/76 (!) 124/59 125/67 (!) 150/70  Pulse: 66  (!) 57    Resp: 15  17   Temp: 97.7 F (36.5 C)  98.3 F (36.8 C)   TempSrc: Oral  Oral   SpO2: 99%  97%   Weight:      Height:        Intake/Output Summary (Last 24 hours) at 09/22/2019 0751 Last data filed at 09/21/2019 1800 Gross per 24 hour  Intake 360 ml  Output 701 ml  Net -341 ml   Filed Weights   09/18/19 0456 09/20/19 0508 09/21/19 0532  Weight: 108.2 kg 109.6 kg 110 kg    Examination:  General exam: No acute distress.  Looks older than stated age. Respiratory system: Bilateral decreased breath sounds at bases with some scattered crackles Cardiovascular system: S1 & S2 heard, mild intermittent bradycardia Gastrointestinal system: Abdomen is nondistended, soft and nontender. Normal bowel sounds heard. Extremities: No cyanosis; trace lower extremity edema.  Right BKA present.   Data Reviewed: I have personally reviewed following labs and imaging studies  CBC: Recent Labs  Lab 09/17/19 1449 09/21/19 0316 09/22/19 0402  WBC 5.8 6.1 5.6  NEUTROABS  --   --  3.2  HGB 8.9* 8.1* 7.8*  HCT 27.5* 25.7* 24.9*  MCV 93.2 95.9 95.8  PLT 205 180 [id number]   Basic Metabolic Panel: Recent Labs  Lab 09/17/19 1449 09/19/19 1242 09/21/19 0316 09/22/19 0402  NA 137 137 135 137  K 5.1 4.9 4.9 5.0  CL 106 105 103 104  CO2 23 24 23 24   GLUCOSE 138* 157* 170* 154*  BUN 53* 47* 50* 47*  CREATININE 2.99* 3.18* 3.49* 3.58*  CALCIUM 9.1 9.1 8.9 8.6*  MG  --   --   --  1.8   GFR: Estimated Creatinine Clearance: 28.4 mL/min (A) (by C-G formula based on SCr of 3.58 mg/dL (H)). Liver Function Tests: Recent Labs  Lab 09/22/19 0402  AST 21  ALT 14  ALKPHOS 81  BILITOT 0.4  PROT 6.6  ALBUMIN 2.5*   No results for input(s): LIPASE, AMYLASE in the last 168 hours. No results for input(s): AMMONIA in the last 168 hours. Coagulation Profile: No results for input(s): INR, PROTIME in the last 168 hours. Cardiac Enzymes: Recent Labs  Lab 09/22/19 0402  CKTOTAL 68   BNP (last 3  results) No results for input(s): PROBNP in the last 8760 hours. HbA1C: No results for input(s): HGBA1C in the last 72 hours. CBG: Recent Labs  Lab 09/20/19 2138 09/21/19 0743 09/21/19 1152 09/21/19 1614 09/21/19 2132  GLUCAP 169* 187* 185* 142* 165*   Lipid Profile: No results for input(s): CHOL, HDL, LDLCALC, TRIG, CHOLHDL, LDLDIRECT in the last 72 hours. Thyroid Function Tests: No results for input(s): TSH, T4TOTAL, FREET4, T3FREE, THYROIDAB in the last 72 hours. Anemia Panel: No results for input(s): VITAMINB12, FOLATE, FERRITIN, TIBC, IRON, RETICCTPCT in the last 72 hours. Sepsis Labs: No results for input(s): PROCALCITON, LATICACIDVEN in the last 168 hours.  No results found for this or any previous visit (from the past 240 hour(s)).       Radiology Studies: No results found.      Scheduled Meds: . allopurinol  100 mg Oral BID  .  amLODipine  10 mg Oral Daily  . Chlorhexidine Gluconate Cloth  6 each Topical Daily  . cloNIDine  0.3 mg Oral Q8H  . diclofenac  1 patch Transdermal BID  . ferrous sulfate  325 mg Oral BID WC  . finasteride  5 mg Oral Daily  . gabapentin  300 mg Oral TID  . heparin  5,000 Units Subcutaneous Q8H  . hydrALAZINE  50 mg Oral Q8H  . insulin aspart  0-9 Units Subcutaneous TID WC  . levothyroxine  50 mcg Oral Q0600  . metoprolol tartrate  50 mg Oral BID  . oxyCODONE  20 mg Oral Q8H  . senna-docusate  1 tablet Oral BID  . sodium bicarbonate  650 mg Oral Daily  . sorbitol  15 mL Oral Daily  . tamsulosin  0.4 mg Oral Daily  . vitamin B-12  2,500 mcg Oral Daily   Continuous Infusions: . sodium chloride 1,000 mL (09/16/19 1820)  . DAPTOmycin (CUBICIN)  IV 1,000 mg (09/21/19 2143)          Aline August, MD [facility] 09/22/2019, 7:51 AM

## 2019-09-22 NOTE — Progress Notes (Signed)
Pt arrived to 6N16 via bed from 5W. Received report from [place], Therapist, sports. See assessment. Will continue to monitor.

## 2019-09-22 NOTE — Progress Notes (Signed)
Pharmacy Antibiotic Note  MAHMUD GEHM is a 68 y.o. male admitted on 08/31/2019 with MRSA bacteremia and discitis T10/11.Marland Kitchen  Pharmacy has been consulted for Daptomycin dosing.  ID: MRSA bacteremia and discitis T10/11. TTE shows no vegetation. ID recommended 8 wks daptomycin. - PICC 11/16  Afeb, WBC 5.6 - If home: Dalbivancin: can't do home abx d/t no caregiver to teach. No place to go for safe IV infusions; - IF SNF: con't Daptomycin  11/13 MRSA PCR: pos 11/11 Bcx2: negative 11/6 [facility] BCx: MRSA   11/11: Dapto>> (10/26/19)  11/12: CK 187 11/15: CK 31 low 11/19: CK 77  11/26: CK 92 12/3: CKD 68  Plan: - Daptomycin 1000 mg change to q 48 hours (~ 8 mg/kg) . - CK levels weekly q Thurs - OPAT changed 12/1 to Dapto. Adjust 12/3 - SNF placement pending    Height: 6\' 6"  (198.1 cm) Weight: 242 lb 8.1 oz (110 kg) IBW/kg (Calculated) : 91.4  Temp (24hrs), Avg:98 F (36.7 C), Min:97.7 F (36.5 C), Max:98.3 F (36.8 C)  Recent Labs  Lab 09/17/19 1449 09/19/19 1242 09/21/19 0316 09/22/19 0402  WBC 5.8  --  6.1 5.6  CREATININE 2.99* 3.18* 3.49* 3.58*    Estimated Creatinine Clearance: 28.4 mL/min (A) (by C-G formula based on SCr of 3.58 mg/dL (H)).    Allergies  Allergen Reactions  . Other Other (See Comments)    Blood pressure issues Per [facility] hospital: Pt states he can only take these pain meds or else he gets very sick: Oxycontin,morphine,demerol, and dilaudid are the only pain meds pt states he can take.    Marland Kitchen Oxycodone Other (See Comments)    Cases extreme changes in Blood pressure Cases extreme changes in Blood pressure   . Oxycodone-Acetaminophen Other (See Comments)    Blood pressure issues, rash from acetaminophen, pt states he can take tylenol though.  . Oxymorphone Other (See Comments)    Causes kidney problems  . Aspirin Nausea And Vomiting    Per [facility]  . Beta Vulgaris Other (See Comments)  . Buspirone Other (See Comments)  . Cabbage  Other (See Comments)  . Codeine Other (See Comments)  . Fish Allergy Other (See Comments)  . Fish-Derived Products   . Pentazocine   . Propoxyphene Other (See Comments)  . Shellfish Allergy Other (See Comments)  . Shellfish-Derived Products   . Sulfa Antibiotics Hives  . Sulfasalazine Other (See Comments)  . Amoxicillin Rash    Per [place], PharmD, BCPS Clinical Staff Pharmacist Eilene Ghazi [place] 09/22/2019 10:33 AM

## 2019-09-22 NOTE — Progress Notes (Signed)
PT Cancellation Note  Patient Details Name: Thomas [name] MRN: [id number] DOB: 1951/09/25   Cancelled Treatment:    Reason Eval/Treat Not Completed: Patient declined, no reason specified.  Pt reported he was just needing to rest, but apparently told OT earlier x 2 that he could not do anything in therapy.  Follow up with him to work on as much mobility as is practical.  Pt is hoping to work on walking at a certain point in the future.   Ramond Dial 09/22/2019, 2:52 PM   Mee Hives, PT MS Acute Rehab Dept. Number: [place]

## 2019-09-22 NOTE — Progress Notes (Signed)
OT Cancellation Note  Patient Details Name: Thomas [name] MRN: [id number] DOB: 06-May-1951   Cancelled Treatment:    Reason Eval/Treat Not Completed: Pain limiting ability to participate;Fatigue/lethargy limiting ability to participate. Pt on BSC upon arrival and declined OT ddue to back pain and still having BM after suppository  Thomas [name] 09/22/2019, 11:24 AM

## 2019-09-22 NOTE — Progress Notes (Signed)
Initial Nutrition Assessment  RD working remotely.  DOCUMENTATION CODES:   Not applicable  INTERVENTION:   - Glucerna Shake po TID, each supplement provides 220 kcal and 10 grams of protein  - Pro-stat 30 ml po BID, each supplement provides 100 kcal and 15 grams of protein  - Encourage adequate PO intake  NUTRITION DIAGNOSIS:   Increased nutrient needs related to acute illness (MRSA bacteremia) as evidenced by estimated needs.  GOAL:   Patient will meet greater than or equal to 90% of their needs  MONITOR:   PO intake, Supplement acceptance, Labs, Weight trends, Skin  REASON FOR ASSESSMENT:   LOS    ASSESSMENT:   68 year old male who presented on 11/11 as a transfer from [facility]. Pt admitted with MRSA bacteremia. PMH of T2DM, HTN, CKD stage IV, anemia, right BKA. CT abdomen showing discitis involving T10, T11 area.   11/16 - s/p tunneled CVC placement  TTE not concerning for vegetation. ID recommending IV antibiotics for approximately 8 weeks. PT and OT recommending SNF. Plan is for SNF; however, this is complicated by Daptomycin cost.  Unable to reach pt via phone call to room.  Phone line busy on all attempts.  Reviewed weights since admission. Pt with a 7 kg weight loss since 09/01/19. This is a 6% weight loss in less than 1 month which is significant for timeframe. PO intake has been variable since admit. Given weight loss, pt is at risk for acute malnutrition and may already meet criteria but RN unable to confirm without NFPE.  Per RN edema assessment, pt with non-pitting edema to BLE.  RD will order oral nutrition supplements to aid pt in meeting kcal and protein needs during admission and promote wound healing.  Meal Completion: 5-100% x last 7 recorded meals  Medications reviewed and include: ferrous sulfate, SSI, Senna, sodium bicarb, vitamin B-12, IV abx IVF: NS @ 75 ml/hr  Labs reviewed: hemoglobin 7.8 CBG's: 142-213 x 24 hours  NUTRITION  - FOCUSED PHYSICAL EXAM:  Unable to complete at this time. RD working remotely.  Diet Order:   Diet Order            Diet - low sodium heart healthy        Diet heart healthy/carb modified Room service appropriate? Yes with Assist; Fluid consistency: Thin  Diet effective now              EDUCATION NEEDS:   No education needs have been identified at this time  Skin:  Skin Assessment: Skin Integrity Issues: Unstageable: left heel, left foot  Last BM:  09/14/19  Height:   Ht Readings from Last 1 Encounters:  09/05/19 6\' 6"  (1.981 m)    Weight:   Wt Readings from Last 1 Encounters:  09/21/19 110 kg    Ideal Body Weight:  91 kg (adjusted for right BKA)  BMI:  Body mass index is 28.02 kg/m.  Estimated Nutritional Needs:   Kcal:  2200-2400  Protein:  110-125 grams  Fluid:  >/= 2.0 L    Gaynell Face, MS, RD, LDN Inpatient Clinical Dietitian Pager: [phone] Weekend/After Hours: [phone]

## 2019-09-23 LAB — CBC WITH DIFFERENTIAL/PLATELET
Abs Immature Granulocytes: 0.02 10*3/uL (ref 0.00–0.07)
Basophils Absolute: 0 10*3/uL (ref 0.0–0.1)
Basophils Relative: 0 %
Eosinophils Absolute: 0.2 10*3/uL (ref 0.0–0.5)
Eosinophils Relative: 5 %
HCT: 23.6 % — ABNORMAL LOW (ref 39.0–52.0)
Hemoglobin: 7.5 g/dL — ABNORMAL LOW (ref 13.0–17.0)
Immature Granulocytes: 0 %
Lymphocytes Relative: 30 %
Lymphs Abs: 1.5 10*3/uL (ref 0.7–4.0)
MCH: 30.5 pg (ref 26.0–34.0)
MCHC: 31.8 g/dL (ref 30.0–36.0)
MCV: 95.9 fL (ref 80.0–100.0)
Monocytes Absolute: 0.4 10*3/uL (ref 0.1–1.0)
Monocytes Relative: 8 %
Neutro Abs: 2.7 10*3/uL (ref 1.7–7.7)
Neutrophils Relative %: 57 %
Platelets: 158 10*3/uL (ref 150–400)
RBC: 2.46 MIL/uL — ABNORMAL LOW (ref 4.22–5.81)
RDW: 15.8 % — ABNORMAL HIGH (ref 11.5–15.5)
WBC: 4.8 10*3/uL (ref 4.0–10.5)
nRBC: 0 % (ref 0.0–0.2)

## 2019-09-23 LAB — BASIC METABOLIC PANEL
Anion gap: 10 (ref 5–15)
BUN: 42 mg/dL — ABNORMAL HIGH (ref 8–23)
CO2: 23 mmol/L (ref 22–32)
Calcium: 8.5 mg/dL — ABNORMAL LOW (ref 8.9–10.3)
Chloride: 102 mmol/L (ref 98–111)
Creatinine, Ser: 3.35 mg/dL — ABNORMAL HIGH (ref 0.61–1.24)
GFR calc Af Amer: 21 mL/min — ABNORMAL LOW (ref >60)
GFR calc non Af Amer: 18 mL/min — ABNORMAL LOW (ref >60)
Glucose, Bld: 141 mg/dL — ABNORMAL HIGH (ref 70–99)
Potassium: 4.7 mmol/L (ref 3.5–5.1)
Sodium: 135 mmol/L (ref 135–145)

## 2019-09-23 LAB — GLUCOSE, CAPILLARY
Glucose-Capillary: 158 mg/dL — ABNORMAL HIGH (ref 70–99)
Glucose-Capillary: 149 mg/dL — ABNORMAL HIGH (ref 70–99)
Glucose-Capillary: 150 mg/dL — ABNORMAL HIGH (ref 70–99)
Glucose-Capillary: 177 mg/dL — ABNORMAL HIGH (ref 70–99)

## 2019-09-23 LAB — MAGNESIUM: Magnesium: 1.7 mg/dL (ref 1.7–2.4)

## 2019-09-23 NOTE — Progress Notes (Signed)
Physical Therapy Treatment Patient Details Name: Thomas [name] MRN: [id number] DOB: 28-Nov-1950 Today's Date: 09/23/2019    History of Present Illness Patient is a 68 year old male history of diabetes mellitus type 2, hypertension, chronic kidney disease stage III, anemia, R BKA Sept 2019, who was admitted at [facility] after having fever chills and back pain. Patient transferred from [facility] for MRSA bacteremia. CT abdomen showing discitis involvling T10, T11 area.    PT Comments    Pt in bed upon PT arrival, and despite some pain, pt was agreeable to participate with PT. The pt was able to come to sitting EOB without assist, demos good movement of BLE and good use of BUE on bed rails. The pt was then able to sit EOB with good balance, despite frequently throwing himself backwards due to pain in his back. When in pain, the pt is not redirectable, and therefore spent a large part of the session stretching his back (flexion preference) as he stated this relieved his pain somewhat despite the pain "moving" all over throughout the session. The pt completed exercises for general LE strengthening while seated EOB, but declined donning of prosthetic as well as further transfers. The pt was educated in other stretches and use of hot pack for pain in back, pt agreeable and RN notified of need for hot pack. The pt will continue to benefit from skilled PT to address functional strength and mobility to progress level of independence with functional tasks.     Follow Up Recommendations  SNF     Equipment Recommendations  None recommended by PT    Recommendations for Other Services       Precautions / Restrictions Precautions Precautions: Fall Precaution Comments: contact precautions MRSA Restrictions Weight Bearing Restrictions: No    Mobility  Bed Mobility Overal bed mobility: Needs Assistance Bed Mobility: Rolling;Sidelying to Sit;Sit to Sidelying Rolling: Modified  independent (Device/Increase time) Sidelying to sit: Supervision     Sit to sidelying: Supervision General bed mobility comments: supervision for safety, the pt was able to control the movement of all extremities without assist despite pain.  Transfers Overall transfer level: (pt refused to trasnfer this afternoon)                  Ambulation/Gait                 Stairs             Wheelchair Mobility    Modified Rankin (Stroke Patients Only)       Balance Overall balance assessment: Needs assistance Sitting-balance support: Feet supported Sitting balance-[name]: Fair Sitting balance - Comments: min guard for safety, often throwing self back on bed due to pain       Standing balance comment: pt declined to stand this afternoon                            Cognition Arousal/Alertness: Awake/alert Behavior During Therapy: Impulsive;Anxious Overall Cognitive Status: No family/caregiver present to determine baseline cognitive functioning Area of Impairment: Attention;Following commands;Awareness;Problem solving;Safety/judgement                   Current Attention Level: Selective   Following Commands: Follows one step commands with increased time;Follows one step commands inconsistently Safety/Judgement: Decreased awareness of safety;Decreased awareness of deficits Awareness: Intellectual Problem Solving: Requires verbal cues;Difficulty sequencing General Comments: Pt seems to be more cognitively intact today, but very  tangential and not agreeable to education on exercises. He will change position without warning due to pain in his back.      Exercises General Exercises - Lower Extremity Long Arc Quad: Strengthening;Both;20 reps;Seated Hip ABduction/ADduction: AROM;Seated;5 reps;Right(pt only completed 5 before claiming movement of R LE caused spasm on L side and had to stop exercises)    General Comments         Pertinent Vitals/Pain Pain Assessment: 0-10 Pain Score: 9  Pain Location: back, especially his left side Pain Descriptors / Indicators: Grimacing;Guarding;Moaning;Sore;Spasm Pain Intervention(s): Limited activity within patient's tolerance;Heat applied;Monitored during session;Repositioned;Premedicated before session;Patient requesting pain meds-RN notified    Home Living                      Prior Function            PT Goals (current goals can now be found in the care plan section) Acute Rehab PT Goals Patient Stated Goal: to move when pain controlled PT Goal Formulation: With patient Time For Goal Achievement: 09/30/19 Potential to Achieve Goals: Fair Progress towards PT goals: Progressing toward goals    Frequency    Min 2X/week      PT Plan Current plan remains appropriate    Co-evaluation              AM-PAC PT "6 Clicks" Mobility   Outcome Measure  Help needed turning from your back to your side while in a flat bed without using bedrails?: A Little Help needed moving from lying on your back to sitting on the side of a flat bed without using bedrails?: A Little Help needed moving to and from a bed to a chair (including a wheelchair)?: A Little Help needed standing up from a chair using your arms (e.g., wheelchair or bedside chair)?: A Little Help needed to walk in hospital room?: A Lot Help needed climbing 3-5 steps with a railing? : Total 6 Click Score: 15    End of Session   Activity Tolerance: Patient limited by pain Patient left: in bed;with call bell/phone within reach;with bed alarm set Nurse Communication: Mobility status;Other (comment)(pt need for hot pack) PT Visit Diagnosis: Muscle weakness (generalized) (M62.81);Difficulty in walking, not elsewhere classified (R26.2);Pain Pain - Right/Left: Left(left back) Pain - part of body: (back)     Time: [id number] PT Time Calculation (min) (ACUTE ONLY): 38 min  Charges:  $Therapeutic  Exercise: 23-37 mins $Therapeutic Activity: 8-22 mins                     Mickey Farber, PT, DPT   Acute Rehabilitation Department [phone]   Otho Bellows 09/23/2019, 3:45 PM

## 2019-09-23 NOTE — TOC Progression Note (Signed)
Transition of Care [facility]) - Progression Note    Patient Details  Name: JOHNSTON HOMAN MRN: [id number] Date of Birth: November 09, 1950  Transition of Care [facility] CM/SW [place], [place] Phone Number: 09/23/2019, 10:52 AM  Clinical Narrative:    [facility] team continuing to follow for needs.  Aware due to cost of iv abx pt will remain inpatient until completion/alternate disposition can be found.    Expected Discharge Plan: [place] Barriers to Discharge: Continued Medical Work up(IV ABX therapy, unable to d/c to SNF 2/2 Daptomycin cost)  Expected Discharge Plan and Services Expected Discharge Plan: [place] Choice: [place] arrangements for the past 2 months: Single Family Home Expected Discharge Date: 09/08/19                  Readmission Risk Interventions Readmission Risk Prevention Plan 09/17/2019  Transportation Screening Complete  PCP or Specialist Appt within 5-7 Days Not Complete  Not Complete comments snf vs home  Home Care Screening Complete  Medication Review (RN CM) Complete  Some recent data might be hidden

## 2019-09-23 NOTE — Plan of Care (Signed)
  Problem: Education: Goal: Knowledge of General Education information will improve Description Including pain rating scale, medication(s)/side effects and non-pharmacologic comfort measures Outcome: Progressing   

## 2019-09-23 NOTE — Progress Notes (Signed)
Patient ID: Thomas [name], male   DOB: 1950/11/22, 68 y.o.   MRN: [id number]  PROGRESS NOTE    Thomas [name]  [id number] DOB: November 13, 1950 DOA: 08/31/2019 PCP: Antonietta Jewel, MD   Brief Narrative:  68 year old male with history of abdominal distention, hypertension, chronic kidney disease stage III, anemia was initially admitted to [facility] for fever, chills and back pain.  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  He was transferred to [facility] for the same.  2D echo at outside hospital showed no vegetation.  ID recommended daptomycin while inpatient and dalbavancin on discharge if discharging home.  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  Patient states he cannot get MRIs -ID recommendations: Daptomycin while inpatient till 10/26/2019; recommendation for dalbavancin on discharge if discharging home (not an option at this time). -SNF refused the patient because of patient requiring daptomycin..  Prior hospitalist had discussed with ID regarding dalbavancin on SNF discharge, which would not be optimal per ID. -Check CK levels weekly  Chronic pain syndrome -Patient follows up with the pain clinic.  He is on OxyContin and Neurontin as an outpatient. -Started on hydromorphone inpatient -Continue OxyContin, hydromorphone, gabapentin.  Continue bowel regimen  Diabetes mellitus type 2 -On glipizide as an outpatient -Continue CBGs with SSI  Acute kidney injury on chronic kidney disease stage IV -Baseline creatinine around 2.7.  Improving, 3.35 today.  Continue gentle hydration.  Renal ultrasound was negative for  hydronephrosis.  Hypothyroidism -Continue Synthroid  Essential hypertension -Continue amlodipine, clonidine, hydralazine, metoprolol  Anemia of chronic disease--stable  Left heel/foot unstageable pressure ulcer: Present on admission -Continue wound care  DVT prophylaxis: Heparin Code Status: Full Family Communication: None at bedside Disposition Plan: Might need to stay inpatient to complete IV antibiotics.  Consultants: ID  Procedures: None  Antimicrobials:  Anti-infectives (From admission, onward)   Start     Dose/Rate Route Frequency Ordered Stop   09/23/19 2000  DAPTOmycin (CUBICIN) 1,000 mg in sodium chloride 0.9 % IVPB     1,000 mg 240 mL/hr over 30 Minutes Intravenous Every 48 hours 09/22/19 1032     09/20/19 0000  daptomycin (CUBICIN) IVPB     1,000 mg Intravenous Every 24 hours 09/20/19 0936 10/26/19 2359   09/14/19 0000  dalbavancin (DALVANCE) 500 MG SOLR  Status:  Discontinued        09/14/19 1306 09/20/19    09/09/19 0000  daptomycin (CUBICIN) IVPB  Status:  Discontinued     1,000 mg Intravenous Every 24 hours 09/09/19 0833 09/14/19    09/08/19 0000  daptomycin (CUBICIN) IVPB  Status:  Discontinued     1,000 mg Intravenous Every 24 hours 09/08/19 0922 09/09/19    09/02/19 2000  DAPTOmycin (CUBICIN) 1,000 mg in sodium chloride 0.9 % IVPB  Status:  Discontinued     1,000 mg 240 mL/hr over 30 Minutes Intravenous Every 48 hours 08/31/19 1205 09/02/19 1206   09/02/19 2000  DAPTOmycin (CUBICIN) 1,000 mg in sodium chloride 0.9 % IVPB  Status:  Discontinued     1,000 mg 240 mL/hr over 30 Minutes Intravenous Every 24 hours 09/02/19 1206 09/22/19 1032   08/31/19 1000  DAPTOmycin (CUBICIN) 750 mg in  sodium chloride 0.9 % IVPB  Status:  Discontinued     750 mg 230 mL/hr over 30 Minutes Intravenous Every 48 hours 08/31/19 0414 08/31/19 1205       Subjective: Patient seen and examined at bedside.  Denies worsening shortness of breath, nausea, vomiting or abdominal  pain.  Still complains of significant back pain but states that he is improving slowly.   Objective: Vitals:   09/22/19 2137 09/22/19 2148 09/23/19 0500 09/23/19 0531  BP: (!) 144/85 (!) 144/85  (!) 141/72  Pulse: 61   61  Resp: 18   18  Temp: 97.9 F (36.6 C)   98.4 F (36.9 C)  TempSrc: Oral   Oral  SpO2: 98%   98%  Weight:   110.7 kg   Height:        Intake/Output Summary (Last 24 hours) at 09/23/2019 0808 Last data filed at 09/23/2019 0754 Gross per 24 hour  Intake 1016.72 ml  Output 3400 ml  Net -2383.28 ml   Filed Weights   09/20/19 0508 09/21/19 0532 09/23/19 0500  Weight: 109.6 kg 110 kg 110.7 kg    Examination:  General exam: No distress.  Looks older than stated age. Respiratory system: Bilateral decreased breath sounds at bases with scattered crackles  cardiovascular system: S1 & S2 heard, currently rate controlled Gastrointestinal system: Abdomen is nondistended, soft and nontender. Normal bowel sounds heard. Extremities: No cyanosis; trace lower extremity edema.  Right BKA present.   Data Reviewed: I have personally reviewed following labs and imaging studies  CBC: Recent Labs  Lab 09/17/19 1449 09/21/19 0316 09/22/19 0402 09/23/19 0454  WBC 5.8 6.1 5.6 4.8  NEUTROABS  --   --  3.2 2.7  HGB 8.9* 8.1* 7.8* 7.5*  HCT 27.5* 25.7* 24.9* 23.6*  MCV 93.2 95.9 95.8 95.9  PLT 205 180 166 0000000   Basic Metabolic Panel: Recent Labs  Lab 09/17/19 1449 09/19/19 1242 09/21/19 0316 09/22/19 0402 09/23/19 0454  NA 137 137 135 137 135  K 5.1 4.9 4.9 5.0 4.7  CL 106 105 103 104 102  CO2 23 24 23 24 23   GLUCOSE 138* 157* 170* 154* 141*  BUN 53* 47* 50* 47* 42*  CREATININE 2.99* 3.18* 3.49* 3.58* 3.35*  CALCIUM 9.1 9.1 8.9 8.6* 8.5*  MG  --   --   --  1.8 1.7   GFR: Estimated Creatinine Clearance: 30.4 mL/min (A) (by C-G formula based on SCr of 3.35 mg/dL (H)). Liver Function Tests: Recent Labs  Lab 09/22/19 0402  AST 21  ALT 14  ALKPHOS 81   BILITOT 0.4  PROT 6.6  ALBUMIN 2.5*   No results for input(s): LIPASE, AMYLASE in the last 168 hours. No results for input(s): AMMONIA in the last 168 hours. Coagulation Profile: No results for input(s): INR, PROTIME in the last 168 hours. Cardiac Enzymes: Recent Labs  Lab 09/22/19 0402  CKTOTAL 68   BNP (last 3 results) No results for input(s): PROBNP in the last 8760 hours. HbA1C: No results for input(s): HGBA1C in the last 72 hours. CBG: Recent Labs  Lab 09/21/19 2132 09/22/19 0815 09/22/19 1149 09/22/19 1640 09/22/19 2110  GLUCAP 165* 213* 118* 178* 154*   Lipid Profile: No results for input(s): CHOL, HDL, LDLCALC, TRIG, CHOLHDL, LDLDIRECT in the last 72 hours. Thyroid Function Tests: No results for input(s): TSH, T4TOTAL, FREET4, T3FREE, THYROIDAB in the last 72 hours. Anemia Panel: No results for input(s): VITAMINB12, FOLATE, FERRITIN, TIBC, IRON, RETICCTPCT in the last  72 hours. Sepsis Labs: No results for input(s): PROCALCITON, LATICACIDVEN in the last 168 hours.  No results found for this or any previous visit (from the past 240 hour(s)).       Radiology Studies: US Renal  Result Date: 09/22/2019 CLINICAL DATA:  Acute kidney injury. EXAM: RENAL / URINARY TRACT ULTRASOUND COMPLETE COMPARISON:  CT, 08/26/2019. FINDINGS: Right Kidney: Renal measurements: 10.5 x 4.1 x 4.3 cm = volume: 94.9 mL. Borderline increased parenchymal echogenicity. Diffuse renal cortical thinning. No mass, stone or hydronephrosis. Left Kidney: Renal measurements: 14.9 x 5.8 x 5.5 cm = volume: 249.9 mL. Borderline increased renal parenchymal echogenicity. No renal mass or stone. No hydronephrosis. Bladder: Dependent debris.  No wall thickening or masses. Other: None. IMPRESSION: 1. Right renal atrophy. Borderline increased renal parenchymal echogenicity bilaterally suggesting medical renal disease. 2. No acute findings.  No hydronephrosis. 3. Dependent debris in the bladder, but no wall  thickening. Electronically Signed   By: Lajean Manes M.D.   On: 09/22/2019 09:13        Scheduled Meds: . allopurinol  100 mg Oral BID  . amLODipine  10 mg Oral Daily  . Chlorhexidine Gluconate Cloth  6 each Topical Daily  . cloNIDine  0.3 mg Oral Q8H  . diclofenac  1 patch Transdermal BID  . feeding supplement (GLUCERNA SHAKE)  237 mL Oral TID BM  . feeding supplement (PRO-STAT SUGAR FREE 64)  30 mL Oral BID  . ferrous sulfate  325 mg Oral BID WC  . finasteride  5 mg Oral Daily  . gabapentin  300 mg Oral TID  . heparin  5,000 Units Subcutaneous Q8H  . hydrALAZINE  50 mg Oral Q8H  . insulin aspart  0-9 Units Subcutaneous TID WC  . levothyroxine  50 mcg Oral Q0600  . metoprolol tartrate  50 mg Oral BID  . oxyCODONE  20 mg Oral Q8H  . senna-docusate  1 tablet Oral BID  . sodium bicarbonate  650 mg Oral Daily  . sorbitol  15 mL Oral Daily  . tamsulosin  0.4 mg Oral Daily  . vitamin B-12  2,500 mcg Oral Daily   Continuous Infusions: . sodium chloride 1,000 mL (09/16/19 1820)  . sodium chloride 75 mL/hr at 09/23/19 0532  . DAPTOmycin (CUBICIN)  IV            Aline August, MD [facility] 09/23/2019, 8:08 AM

## 2019-09-24 LAB — BASIC METABOLIC PANEL
Anion gap: 9 (ref 5–15)
BUN: 39 mg/dL — ABNORMAL HIGH (ref 8–23)
CO2: 23 mmol/L (ref 22–32)
Calcium: 8.5 mg/dL — ABNORMAL LOW (ref 8.9–10.3)
Chloride: 105 mmol/L (ref 98–111)
Creatinine, Ser: 3.11 mg/dL — ABNORMAL HIGH (ref 0.61–1.24)
GFR calc Af Amer: 23 mL/min — ABNORMAL LOW (ref >60)
GFR calc non Af Amer: 20 mL/min — ABNORMAL LOW (ref >60)
Glucose, Bld: 154 mg/dL — ABNORMAL HIGH (ref 70–99)
Potassium: 4.8 mmol/L (ref 3.5–5.1)
Sodium: 137 mmol/L (ref 135–145)

## 2019-09-24 LAB — CBC WITH DIFFERENTIAL/PLATELET
Abs Immature Granulocytes: 0.02 10*3/uL (ref 0.00–0.07)
Basophils Absolute: 0 10*3/uL (ref 0.0–0.1)
Basophils Relative: 0 %
Eosinophils Absolute: 0.2 10*3/uL (ref 0.0–0.5)
Eosinophils Relative: 5 %
HCT: 23.1 % — ABNORMAL LOW (ref 39.0–52.0)
Hemoglobin: 7.3 g/dL — ABNORMAL LOW (ref 13.0–17.0)
Immature Granulocytes: 0 %
Lymphocytes Relative: 23 %
Lymphs Abs: 1 10*3/uL (ref 0.7–4.0)
MCH: 30.5 pg (ref 26.0–34.0)
MCHC: 31.6 g/dL (ref 30.0–36.0)
MCV: 96.7 fL (ref 80.0–100.0)
Monocytes Absolute: 0.3 10*3/uL (ref 0.1–1.0)
Monocytes Relative: 6 %
Neutro Abs: 3 10*3/uL (ref 1.7–7.7)
Neutrophils Relative %: 66 %
Platelets: 156 10*3/uL (ref 150–400)
RBC: 2.39 MIL/uL — ABNORMAL LOW (ref 4.22–5.81)
RDW: 15.8 % — ABNORMAL HIGH (ref 11.5–15.5)
WBC: 4.6 10*3/uL (ref 4.0–10.5)
nRBC: 0 % (ref 0.0–0.2)

## 2019-09-24 LAB — GLUCOSE, CAPILLARY
Glucose-Capillary: 238 mg/dL — ABNORMAL HIGH (ref 70–99)
Glucose-Capillary: 142 mg/dL — ABNORMAL HIGH (ref 70–99)
Glucose-Capillary: 114 mg/dL — ABNORMAL HIGH (ref 70–99)
Glucose-Capillary: 197 mg/dL — ABNORMAL HIGH (ref 70–99)

## 2019-09-24 LAB — MAGNESIUM: Magnesium: 1.7 mg/dL (ref 1.7–2.4)

## 2019-09-24 MED ORDER — POLYETHYLENE GLYCOL 3350 17 G PO PACK
17.0000 g | PACK | Freq: Every day | ORAL | Status: DC
  Administered 2019-09-25: 17 g via ORAL
  Filled 2019-09-24 (×2): qty 1

## 2019-09-24 NOTE — Progress Notes (Signed)
Patient ID: Thomas [name], male   DOB: 10/16/1951, 68 y.o.   MRN: [id number]  PROGRESS NOTE    Thomas [name]  [id number] DOB: June 27, 1951 DOA: 08/31/2019 PCP: Antonietta Jewel, MD   Brief Narrative:  68 year old male with history of abdominal distention, hypertension, chronic kidney disease stage III, anemia was initially admitted to [facility] for fever, chills and back pain.  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  He was transferred to [facility] for the same.  2D echo at outside hospital showed no vegetation.  ID recommended daptomycin while inpatient and dalbavancin on discharge if discharging home.  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  Patient states he cannot get MRIs -ID recommendations: Daptomycin while inpatient till 10/26/2019; recommendation for dalbavancin on discharge if discharging home (not an option at this time). -SNF refused the patient because of patient requiring daptomycin..  Prior hospitalist had discussed with ID regarding dalbavancin on SNF discharge, which would not be optimal per ID. -Check CK levels weekly -Currently has remained afebrile and hemodynamically stable.  Chronic pain syndrome -Patient follows up with the pain clinic.  He is on OxyContin and Neurontin as an outpatient. -Started on hydromorphone inpatient -Continue OxyContin, hydromorphone, gabapentin.  Continue bowel regimen  Diabetes mellitus type 2 -On glipizide as an outpatient -Continue CBGs with SSI  Acute kidney injury on chronic kidney disease stage IV -Baseline creatinine around 2.7.  Improving, 3.11 today.   Decrease normal saline to 50 cc an hour.  Renal ultrasound was negative for hydronephrosis.  Repeat a.m. creatinine.  Hypothyroidism -Continue Synthroid  Essential hypertension -Continue amlodipine, clonidine, hydralazine, metoprolol  Anemia of chronic disease--stable.  Transfuse if hemoglobin is less than 7.  Left heel/foot unstageable pressure ulcer: Present on admission -Continue wound care  DVT prophylaxis: Heparin Code Status: Full Family Communication: None at bedside Disposition Plan: Might need to stay inpatient to complete IV antibiotics.  Consultants: ID  Procedures: None  Antimicrobials:  Anti-infectives (From admission, onward)   Start     Dose/Rate Route Frequency Ordered Stop   09/23/19 2000  DAPTOmycin (CUBICIN) 1,000 mg in sodium chloride 0.9 % IVPB     1,000 mg 240 mL/hr over 30 Minutes Intravenous Every 48 hours 09/22/19 1032     09/20/19 0000  daptomycin (CUBICIN) IVPB     1,000 mg Intravenous Every 24 hours 09/20/19 0936 10/26/19 2359   09/14/19 0000  dalbavancin (DALVANCE) 500 MG SOLR  Status:  Discontinued        09/14/19 1306 09/20/19    09/09/19 0000  daptomycin (CUBICIN) IVPB  Status:  Discontinued     1,000 mg Intravenous Every 24 hours 09/09/19 0833 09/14/19    09/08/19 0000  daptomycin (CUBICIN) IVPB  Status:  Discontinued     1,000 mg Intravenous Every 24 hours 09/08/19 0922 09/09/19    09/02/19 2000  DAPTOmycin (CUBICIN) 1,000 mg in sodium chloride 0.9 % IVPB  Status:  Discontinued     1,000 mg 240 mL/hr over 30 Minutes Intravenous Every 48 hours 08/31/19 1205 09/02/19 1206   09/02/19 2000  DAPTOmycin (CUBICIN) 1,000 mg in sodium chloride 0.9 % IVPB  Status:  Discontinued     1,000  mg 240 mL/hr over 30 Minutes Intravenous Every 24 hours 09/02/19 1206 09/22/19 1032   08/31/19 1000  DAPTOmycin (CUBICIN) 750 mg in sodium chloride 0.9 % IVPB  Status:  Discontinued     750 mg 230 mL/hr over 30 Minutes Intravenous Every 48 hours 08/31/19 0414  08/31/19 1205       Subjective: Patient seen and examined at bedside.  No fever, nausea, vomiting, abdominal pain.  Still having significant back pain.   Objective: Vitals:   09/23/19 1513 09/23/19 2104 09/23/19 2228 09/24/19 0633  BP: 134/87 126/75 126/75 134/77  Pulse: 100 (!) 56 60 97  Resp: 18 18  20   Temp: (!) 97.5 F (36.4 C) 97.9 F (36.6 C)  98.1 F (36.7 C)  TempSrc: Oral Oral  Oral  SpO2: 100% 96%  100%  Weight:      Height:        Intake/Output Summary (Last 24 hours) at 09/24/2019 0756 Last data filed at 09/24/2019 [id number] Gross per 24 hour  Intake 280 ml  Output 1750 ml  Net -1470 ml   Filed Weights   09/20/19 0508 09/21/19 0532 09/23/19 0500  Weight: 109.6 kg 110 kg 110.7 kg    Examination:  General exam: In mild distress secondary to back pain.  Looks older than stated age. Respiratory system: Bilateral decreased breath sounds at bases with some crackles.  No wheezing  cardiovascular system: Intermittently bradycardic, S1-S2 heard Gastrointestinal system: Abdomen is nondistended, soft and nontender. Normal bowel sounds heard. Extremities: No cyanosis; trace lower extremity edema.  Right BKA present.   Data Reviewed: I have personally reviewed following labs and imaging studies  CBC: Recent Labs  Lab 09/17/19 1449 09/21/19 0316 09/22/19 0402 09/23/19 0454 09/24/19 0419  WBC 5.8 6.1 5.6 4.8 4.6  NEUTROABS  --   --  3.2 2.7 3.0  HGB 8.9* 8.1* 7.8* 7.5* 7.3*  HCT 27.5* 25.7* 24.9* 23.6* 23.1*  MCV 93.2 95.9 95.8 95.9 96.7  PLT 205 180 166 158 [id number]   Basic Metabolic Panel: Recent Labs  Lab 09/19/19 1242 09/21/19 0316 09/22/19 0402 09/23/19 0454 09/24/19 0419  NA 137 135 137 135 137  K 4.9 4.9 5.0 4.7 4.8  CL 105 103 104 102 105  CO2 24 23 24 23 23   GLUCOSE 157* 170* 154* 141* 154*  BUN 47* 50* 47* 42* 39*  CREATININE 3.18* 3.49* 3.58* 3.35* 3.11*  CALCIUM 9.1 8.9 8.6* 8.5* 8.5*  MG  --   --  1.8 1.7 1.7   GFR: Estimated Creatinine  Clearance: 32.8 mL/min (A) (by C-G formula based on SCr of 3.11 mg/dL (H)). Liver Function Tests: Recent Labs  Lab 09/22/19 0402  AST 21  ALT 14  ALKPHOS 81  BILITOT 0.4  PROT 6.6  ALBUMIN 2.5*   No results for input(s): LIPASE, AMYLASE in the last 168 hours. No results for input(s): AMMONIA in the last 168 hours. Coagulation Profile: No results for input(s): INR, PROTIME in the last 168 hours. Cardiac Enzymes: Recent Labs  Lab 09/22/19 0402  CKTOTAL 68   BNP (last 3 results) No results for input(s): PROBNP in the last 8760 hours. HbA1C: No results for input(s): HGBA1C in the last 72 hours. CBG: Recent Labs  Lab 09/22/19 2110 09/23/19 0757 09/23/19 1152 09/23/19 1823 09/23/19 2108  GLUCAP 154* 158* 149* 150* 177*   Lipid Profile: No results for input(s): CHOL, HDL, LDLCALC, TRIG, CHOLHDL, LDLDIRECT in the last 72 hours. Thyroid Function Tests: No results for input(s): TSH,  T4TOTAL, FREET4, T3FREE, THYROIDAB in the last 72 hours. Anemia Panel: No results for input(s): VITAMINB12, FOLATE, FERRITIN, TIBC, IRON, RETICCTPCT in the last 72 hours. Sepsis Labs: No results for input(s): PROCALCITON, LATICACIDVEN in the last 168 hours.  No results found for this or any previous visit (from the past 240 hour(s)).       Radiology Studies: US Renal  Result Date: 09/22/2019 CLINICAL DATA:  Acute kidney injury. EXAM: RENAL / URINARY TRACT ULTRASOUND COMPLETE COMPARISON:  CT, 08/26/2019. FINDINGS: Right Kidney: Renal measurements: 10.5 x 4.1 x 4.3 cm = volume: 94.9 mL. Borderline increased parenchymal echogenicity. Diffuse renal cortical thinning. No mass, stone or hydronephrosis. Left Kidney: Renal measurements: 14.9 x 5.8 x 5.5 cm = volume: 249.9 mL. Borderline increased renal parenchymal echogenicity. No renal mass or stone. No hydronephrosis. Bladder: Dependent debris.  No wall thickening or masses. Other: None. IMPRESSION: 1. Right renal atrophy. Borderline increased renal  parenchymal echogenicity bilaterally suggesting medical renal disease. 2. No acute findings.  No hydronephrosis. 3. Dependent debris in the bladder, but no wall thickening. Electronically Signed   By: Lajean Manes M.D.   On: 09/22/2019 09:13        Scheduled Meds:  allopurinol  100 mg Oral BID   amLODipine  10 mg Oral Daily   Chlorhexidine Gluconate Cloth  6 each Topical Daily   cloNIDine  0.3 mg Oral Q8H   diclofenac  1 patch Transdermal BID   feeding supplement (GLUCERNA SHAKE)  237 mL Oral TID BM   feeding supplement (PRO-STAT SUGAR FREE 64)  30 mL Oral BID   ferrous sulfate  325 mg Oral BID WC   finasteride  5 mg Oral Daily   gabapentin  300 mg Oral TID   heparin  5,000 Units Subcutaneous Q8H   hydrALAZINE  50 mg Oral Q8H   insulin aspart  0-9 Units Subcutaneous TID WC   levothyroxine  50 mcg Oral Q0600   metoprolol tartrate  50 mg Oral BID   oxyCODONE  20 mg Oral Q8H   senna-docusate  1 tablet Oral BID   sodium bicarbonate  650 mg Oral Daily   sorbitol  15 mL Oral Daily   tamsulosin  0.4 mg Oral Daily   vitamin B-12  2,500 mcg Oral Daily   Continuous Infusions:  sodium chloride 1,000 mL (09/16/19 1820)   sodium chloride 75 mL/hr at 09/24/19 0630   DAPTOmycin (CUBICIN)  IV 1,000 mg (09/23/19 1946)          Aline August, MD [facility] 09/24/2019, 7:56 AM

## 2019-09-25 LAB — BASIC METABOLIC PANEL
Anion gap: 10 (ref 5–15)
BUN: 36 mg/dL — ABNORMAL HIGH (ref 8–23)
CO2: 22 mmol/L (ref 22–32)
Calcium: 8.7 mg/dL — ABNORMAL LOW (ref 8.9–10.3)
Chloride: 105 mmol/L (ref 98–111)
Creatinine, Ser: 2.87 mg/dL — ABNORMAL HIGH (ref 0.61–1.24)
GFR calc Af Amer: 25 mL/min — ABNORMAL LOW (ref >60)
GFR calc non Af Amer: 22 mL/min — ABNORMAL LOW (ref >60)
Glucose, Bld: 146 mg/dL — ABNORMAL HIGH (ref 70–99)
Potassium: 4.7 mmol/L (ref 3.5–5.1)
Sodium: 137 mmol/L (ref 135–145)

## 2019-09-25 LAB — GLUCOSE, CAPILLARY
Glucose-Capillary: 150 mg/dL — ABNORMAL HIGH (ref 70–99)
Glucose-Capillary: 235 mg/dL — ABNORMAL HIGH (ref 70–99)
Glucose-Capillary: 157 mg/dL — ABNORMAL HIGH (ref 70–99)
Glucose-Capillary: 207 mg/dL — ABNORMAL HIGH (ref 70–99)

## 2019-09-25 MED ORDER — SODIUM CHLORIDE 0.9 % IV SOLN
1000.0000 mg | Freq: Every day | INTRAVENOUS | Status: AC
  Administered 2019-09-25 – 2019-10-08 (×14): 1000 mg via INTRAVENOUS
  Filled 2019-09-25 (×14): qty 20

## 2019-09-25 MED ORDER — OXYCODONE HCL ER 15 MG PO T12A
30.0000 mg | EXTENDED_RELEASE_TABLET | Freq: Two times a day (BID) | ORAL | Status: DC
  Administered 2019-09-25 – 2019-09-26 (×2): 30 mg via ORAL
  Filled 2019-09-25 (×2): qty 2

## 2019-09-25 NOTE — Progress Notes (Signed)
Patient ID: Thomas [name], male   DOB: [birth year], 68 y.o.   MRN: [id number]  PROGRESS NOTE    Thomas [name]  [id number] DOB: 06-03-1951 DOA: 08/31/2019 PCP: Antonietta Jewel, MD   Brief Narrative:  68 year old male with history of abdominal distention, hypertension, chronic kidney disease stage III, anemia was initially admitted to [facility] for fever, chills and back pain.  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  He was transferred to [facility] for the same.  2D echo at outside hospital showed no vegetation.  ID recommended daptomycin while inpatient and dalbavancin on discharge if discharging home.  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  Patient states he cannot get MRIs -ID recommendations: Daptomycin while inpatient till 10/26/2019; recommendation for dalbavancin on discharge if discharging home (not an option at this time). -SNF refused the patient because of patient requiring daptomycin..  Prior hospitalist had discussed with ID regarding dalbavancin on SNF discharge, which would not be optimal per ID. -Check CK levels weekly -Currently has remained afebrile and hemodynamically stable.  Chronic pain syndrome -Patient follows up with the pain clinic.  He is on OxyContin and Neurontin as an outpatient. -Started on hydromorphone inpatient -Continue hydromorphone as needed.  Continue gabapentin.  Increase OxyContin to 30 mg twice a day.  Continue bowel regimen  Diabetes mellitus type 2 -On glipizide as an outpatient -Continue CBGs with SSI  Acute kidney injury on chronic kidney disease stage IV -Baseline  creatinine around 2.7.  Improving, 2.87 today.  DC IV fluids today.  Renal ultrasound was negative for hydronephrosis.  Repeat a.m. creatinine.  Hypothyroidism -Continue Synthroid  Essential hypertension -Continue amlodipine, clonidine, hydralazine, metoprolol  Anemia of chronic disease--stable.  Transfuse if hemoglobin is less than 7.  Left heel/foot unstageable pressure ulcer: Present on admission -Continue wound care  DVT prophylaxis: Heparin Code Status: Full Family Communication: None at bedside Disposition Plan: Might need to stay inpatient to complete IV antibiotics.  Consultants: ID  Procedures: None  Antimicrobials:  Anti-infectives (From admission, onward)   Start     Dose/Rate Route Frequency Ordered Stop   09/23/19 2000  DAPTOmycin (CUBICIN) 1,000 mg in sodium chloride 0.9 % IVPB     1,000 mg 240 mL/hr over 30 Minutes Intravenous Every 48 hours 09/22/19 1032     09/20/19 0000  daptomycin (CUBICIN) IVPB     1,000 mg Intravenous Every 24 hours 09/20/19 0936 10/26/19 2359   09/14/19 0000  dalbavancin (DALVANCE) 500 MG SOLR  Status:  Discontinued        09/14/19 1306 09/20/19    09/09/19 0000  daptomycin (CUBICIN) IVPB  Status:  Discontinued     1,000 mg Intravenous Every 24 hours 09/09/19 0833 09/14/19    09/08/19 0000  daptomycin (CUBICIN) IVPB  Status:  Discontinued     1,000 mg Intravenous Every 24 hours 09/08/19 0922 09/09/19    09/02/19 2000  DAPTOmycin (CUBICIN) 1,000 mg in sodium chloride 0.9 % IVPB  Status:  Discontinued     1,000 mg 240 mL/hr over 30 Minutes Intravenous Every 48 hours 08/31/19 1205 09/02/19 1206   09/02/19 2000  DAPTOmycin (CUBICIN) 1,000 mg in sodium chloride 0.9 % IVPB  Status:  Discontinued     1,000 mg 240 mL/hr over 30 Minutes Intravenous Every 24 hours 09/02/19 1206 09/22/19 1032   08/31/19 1000  DAPTOmycin (CUBICIN) 750 mg in sodium chloride 0.9 % IVPB  Status:  Discontinued     750 mg 230 mL/hr over 30 Minutes Intravenous Every 48  hours 08/31/19 0414 08/31/19 1205       Subjective: Patient seen and examined at bedside.  Still complains of intermittent severe low back pain.  No overnight fever, nausea or vomiting.  Objective: Vitals:   09/24/19 0633 09/24/19 1603 09/24/19 2054 09/25/19 0537  BP: 134/77 134/80 127/68 (!) 142/79  Pulse: 97 (!) 57 (!) 59 66  Resp: 20 18 18 18   Temp: 98.1 F (36.7 C) 98.1 F (36.7 C) 97.9 F (36.6 C) 98 F (36.7 C)  TempSrc: Oral Oral  Oral  SpO2: 100%  100% 100%  Weight:      Height:        Intake/Output Summary (Last 24 hours) at 09/25/2019 0812 Last data filed at 09/25/2019 0500 Gross per 24 hour  Intake 1525 ml  Output 1200 ml  Net 325 ml   Filed Weights   09/20/19 0508 09/21/19 0532 09/23/19 0500  Weight: 109.6 kg 110 kg 110.7 kg    Examination:  General exam: No acute distress.  Looks older than stated age. Respiratory system: Bilateral decreased breath sounds at bases with some scattered crackles  cardiovascular system: S1-S2 heard, mild bradycardia Gastrointestinal system: Abdomen is nondistended, soft and nontender. Normal bowel sounds heard. Extremities: No cyanosis; trace lower extremity edema.  Right BKA present.   Data Reviewed: I have personally reviewed following labs and imaging studies  CBC: Recent Labs  Lab 09/21/19 0316 09/22/19 0402 09/23/19 0454 09/24/19 0419  WBC 6.1 5.6 4.8 4.6  NEUTROABS  --  3.2 2.7 3.0  HGB 8.1* 7.8* 7.5* 7.3*  HCT 25.7* 24.9* 23.6* 23.1*  MCV 95.9 95.8 95.9 96.7  PLT 180 166 158 [id number]   Basic Metabolic Panel: Recent Labs  Lab 09/21/19 0316 09/22/19 0402 09/23/19 0454 09/24/19 0419 09/25/19 0400  NA 135 137 135 137 137  K 4.9 5.0 4.7 4.8 4.7  CL 103 104 102 105 105  CO2 23 24 23 23 22   GLUCOSE 170* 154* 141* 154* 146*  BUN 50* 47* 42* 39* 36*  CREATININE 3.49* 3.58* 3.35* 3.11* 2.87*  CALCIUM 8.9 8.6* 8.5* 8.5* 8.7*  MG  --  1.8 1.7 1.7  --    GFR: Estimated Creatinine Clearance: 35.5 mL/min (A)  (by C-G formula based on SCr of 2.87 mg/dL (H)). Liver Function Tests: Recent Labs  Lab 09/22/19 0402  AST 21  ALT 14  ALKPHOS 81  BILITOT 0.4  PROT 6.6  ALBUMIN 2.5*   No results for input(s): LIPASE, AMYLASE in the last 168 hours. No results for input(s): AMMONIA in the last 168 hours. Coagulation Profile: No results for input(s): INR, PROTIME in the last 168 hours. Cardiac Enzymes: Recent Labs  Lab 09/22/19 0402  CKTOTAL 68   BNP (last 3 results) No results for input(s): PROBNP in the last 8760 hours. HbA1C: No results for input(s): HGBA1C in the last 72 hours. CBG: Recent Labs  Lab 09/24/19 0828 09/24/19 1303 09/24/19 1710 09/24/19 2129 09/25/19 0800  GLUCAP 238* 142* 114* 197* 150*   Lipid Profile: No results for input(s): CHOL, HDL, LDLCALC, TRIG, CHOLHDL, LDLDIRECT in the last 72 hours. Thyroid Function Tests: No results for input(s): TSH, T4TOTAL, FREET4,  T3FREE, THYROIDAB in the last 72 hours. Anemia Panel: No results for input(s): VITAMINB12, FOLATE, FERRITIN, TIBC, IRON, RETICCTPCT in the last 72 hours. Sepsis Labs: No results for input(s): PROCALCITON, LATICACIDVEN in the last 168 hours.  No results found for this or any previous visit (from the past 240 hour(s)).       Radiology Studies: No results found.      Scheduled Meds: . allopurinol  100 mg Oral BID  . amLODipine  10 mg Oral Daily  . Chlorhexidine Gluconate Cloth  6 each Topical Daily  . cloNIDine  0.3 mg Oral Q8H  . diclofenac  1 patch Transdermal BID  . feeding supplement (GLUCERNA SHAKE)  237 mL Oral TID BM  . feeding supplement (PRO-STAT SUGAR FREE 64)  30 mL Oral BID  . ferrous sulfate  325 mg Oral BID WC  . finasteride  5 mg Oral Daily  . gabapentin  300 mg Oral TID  . heparin  5,000 Units Subcutaneous Q8H  . hydrALAZINE  50 mg Oral Q8H  . insulin aspart  0-9 Units Subcutaneous TID WC  . levothyroxine  50 mcg Oral Q0600  . metoprolol tartrate  50 mg Oral BID  .  oxyCODONE  20 mg Oral Q8H  . polyethylene glycol  17 g Oral Daily  . senna-docusate  1 tablet Oral BID  . sodium bicarbonate  650 mg Oral Daily  . sorbitol  15 mL Oral Daily  . tamsulosin  0.4 mg Oral Daily  . vitamin B-12  2,500 mcg Oral Daily   Continuous Infusions: . sodium chloride 1,000 mL (09/16/19 1820)  . sodium chloride 50 mL/hr at 09/24/19 2106  . DAPTOmycin (CUBICIN)  IV 1,000 mg (09/23/19 1946)          Aline August, MD [facility] 09/25/2019, 8:12 AM

## 2019-09-25 NOTE — Progress Notes (Signed)
PHARMACY CONSULT NOTE FOR: [place] administration of Daptomycin  OUTPATIENT  PARENTERAL ANTIBIOTIC THERAPY (OPAT)  Indication: MRSA bacteremia and discitis Regimen: Daptomycin 1000mg  IV q 24h End date: 10/26/2019  IV antibiotic discharge orders are pended. To discharging provider:  please sign these orders via discharge navigator,  Select New Orders & click on the button choice - Manage This Unsigned Work.      Thank you for allowing pharmacy to be a part of this patient's care.  Nicole Cella, RPh Clinical Staff Pharmacist 09/25/2019, 9:54 AM

## 2019-09-25 NOTE — Progress Notes (Signed)
Pharmacy Antibiotic Note  Thomas [name] is a 68 y.o. male admitted on 08/31/2019 with MRSA bacteremia and discitis T10/11.Marland Kitchen  Pharmacy  consulted for Daptomycin dosing.  ID: MRSA bacteremia and discitis T10/11. TTE shows no vegetation. ID recommended 8 wks daptomycin. - PICC 11/16 Afeb, WBC 4.8>4.6 SCr trending down 3.35>3.11>2.87,   H/o CKD baseline SCr ~ 2.7.  CrCl ~ 35 ml/min, thus we can change dapto dose back to q24h interval. H/o CKD baseline SCr ~ 2.7.  11/13 MRSA PCR: pos 11/11 Bcx2: negative 11/6 [place] BCx: MRSA   11/11: Dapto>> (10/26/19)  11/12: CK 187 11/15: CK 31 low 11/19: CK 77  11/26: CK 92 12/3: CKD 68  Plan: - Adjust Daptomycin 1000 mg to q 24 hours (~ 9 mg/kg) . - CK levels weekly q Thurs, next due 12/10 - OPAT order adjusted 12/6 - SNF placement pending    Height: 6\' 6"  (198.1 cm) Weight: 244 lb 0.8 oz (110.7 kg) IBW/kg (Calculated) : 91.4  Temp (24hrs), Avg:98 F (36.7 C), Min:97.9 F (36.6 C), Max:98.1 F (36.7 C)  Recent Labs  Lab 09/21/19 0316 09/22/19 0402 09/23/19 0454 09/24/19 0419 09/25/19 0400  WBC 6.1 5.6 4.8 4.6  --   CREATININE 3.49* 3.58* 3.35* 3.11* 2.87*    Estimated Creatinine Clearance: 35.5 mL/min (A) (by C-G formula based on SCr of 2.87 mg/dL (H)).    Allergies  Allergen Reactions  . Other Other (See Comments)    Blood pressure issues Per [facility] hospital: Pt states he can only take these pain meds or else he gets very sick: Oxycontin,morphine,demerol, and dilaudid are the only pain meds pt states he can take.    Marland Kitchen Oxycodone Other (See Comments)    Cases extreme changes in Blood pressure Cases extreme changes in Blood pressure   . Oxycodone-Acetaminophen Other (See Comments)    Blood pressure issues, rash from acetaminophen, pt states he can take tylenol though.  . Oxymorphone Other (See Comments)    Causes kidney problems  . Aspirin Nausea And Vomiting    Per [facility]  . Beta Vulgaris Other (See  Comments)  . Buspirone Other (See Comments)  . Cabbage Other (See Comments)  . Codeine Other (See Comments)  . Fish Allergy Other (See Comments)  . Fish-Derived Products   . Pentazocine   . Propoxyphene Other (See Comments)  . Shellfish Allergy Other (See Comments)  . Shellfish-Derived Products   . Sulfa Antibiotics Hives  . Sulfasalazine Other (See Comments)  . Amoxicillin Rash    Per [place], [place] Clinical Pharmacist [phone] Please check AMION for all [place] phone numbers After 10:00 PM, call [place] [phone] 09/25/2019 9:41 AM

## 2019-09-26 ENCOUNTER — Inpatient Hospital Stay (HOSPITAL_COMMUNITY): Payer: Medicare Other

## 2019-09-26 LAB — BASIC METABOLIC PANEL
Anion gap: 8 (ref 5–15)
BUN: 32 mg/dL — ABNORMAL HIGH (ref 8–23)
CO2: 24 mmol/L (ref 22–32)
Calcium: 8.8 mg/dL — ABNORMAL LOW (ref 8.9–10.3)
Chloride: 105 mmol/L (ref 98–111)
Creatinine, Ser: 2.68 mg/dL — ABNORMAL HIGH (ref 0.61–1.24)
GFR calc Af Amer: 27 mL/min — ABNORMAL LOW (ref >60)
GFR calc non Af Amer: 24 mL/min — ABNORMAL LOW (ref >60)
Glucose, Bld: 174 mg/dL — ABNORMAL HIGH (ref 70–99)
Potassium: 4.5 mmol/L (ref 3.5–5.1)
Sodium: 137 mmol/L (ref 135–145)

## 2019-09-26 LAB — GLUCOSE, CAPILLARY
Glucose-Capillary: 155 mg/dL — ABNORMAL HIGH (ref 70–99)
Glucose-Capillary: 128 mg/dL — ABNORMAL HIGH (ref 70–99)
Glucose-Capillary: 144 mg/dL — ABNORMAL HIGH (ref 70–99)
Glucose-Capillary: 153 mg/dL — ABNORMAL HIGH (ref 70–99)

## 2019-09-26 LAB — CBC WITH DIFFERENTIAL/PLATELET
Abs Immature Granulocytes: 0.02 10*3/uL (ref 0.00–0.07)
Basophils Absolute: 0 10*3/uL (ref 0.0–0.1)
Basophils Relative: 0 %
Eosinophils Absolute: 0.3 10*3/uL (ref 0.0–0.5)
Eosinophils Relative: 5 %
HCT: 23.7 % — ABNORMAL LOW (ref 39.0–52.0)
Hemoglobin: 7.6 g/dL — ABNORMAL LOW (ref 13.0–17.0)
Immature Granulocytes: 0 %
Lymphocytes Relative: 26 %
Lymphs Abs: 1.5 10*3/uL (ref 0.7–4.0)
MCH: 30.2 pg (ref 26.0–34.0)
MCHC: 32.1 g/dL (ref 30.0–36.0)
MCV: 94 fL (ref 80.0–100.0)
Monocytes Absolute: 0.3 10*3/uL (ref 0.1–1.0)
Monocytes Relative: 6 %
Neutro Abs: 3.5 10*3/uL (ref 1.7–7.7)
Neutrophils Relative %: 63 %
Platelets: 161 10*3/uL (ref 150–400)
RBC: 2.52 MIL/uL — ABNORMAL LOW (ref 4.22–5.81)
RDW: 15.5 % (ref 11.5–15.5)
WBC: 5.7 10*3/uL (ref 4.0–10.5)
nRBC: 0 % (ref 0.0–0.2)

## 2019-09-26 LAB — MAGNESIUM: Magnesium: 1.5 mg/dL — ABNORMAL LOW (ref 1.7–2.4)

## 2019-09-26 IMAGING — CR DG ABDOMEN 2V
4 series · 5 of 5 positions shown · non-contrast
Comparison: CT [DATE]

CLINICAL DATA: Abdominal pain

EXAM:
ABDOMEN - 2 VIEW

[abdomen erect (1 of 2)]
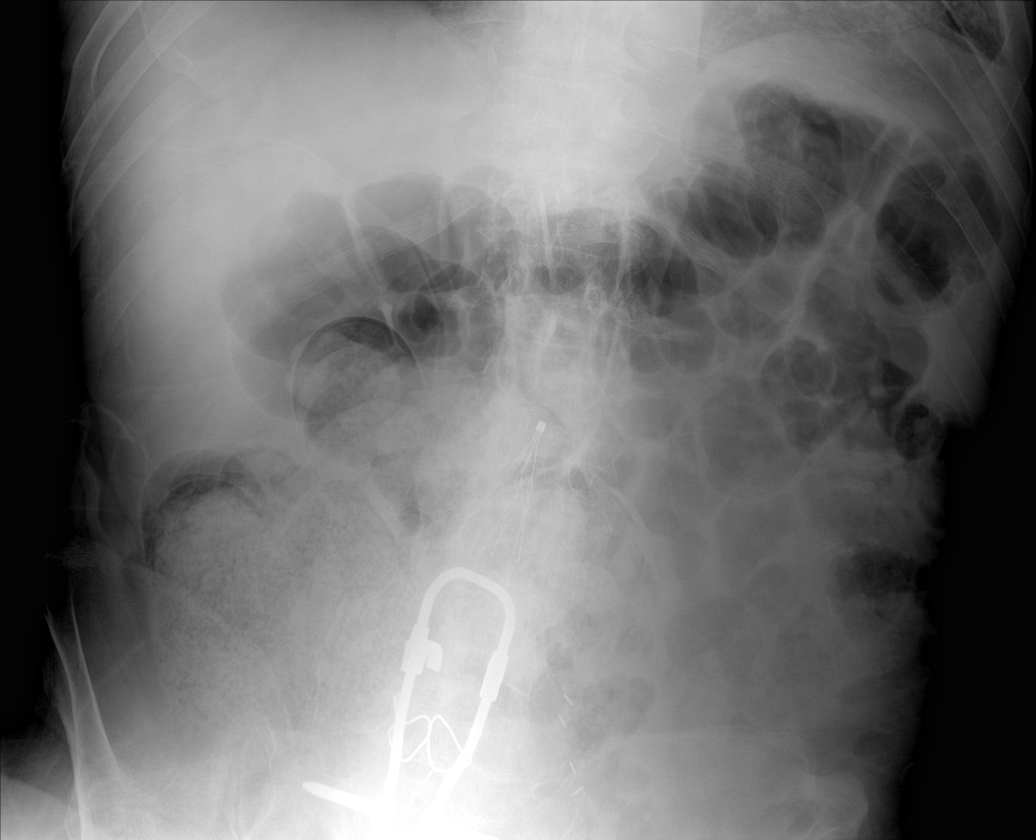

[abdomen supine (1 of 2)]
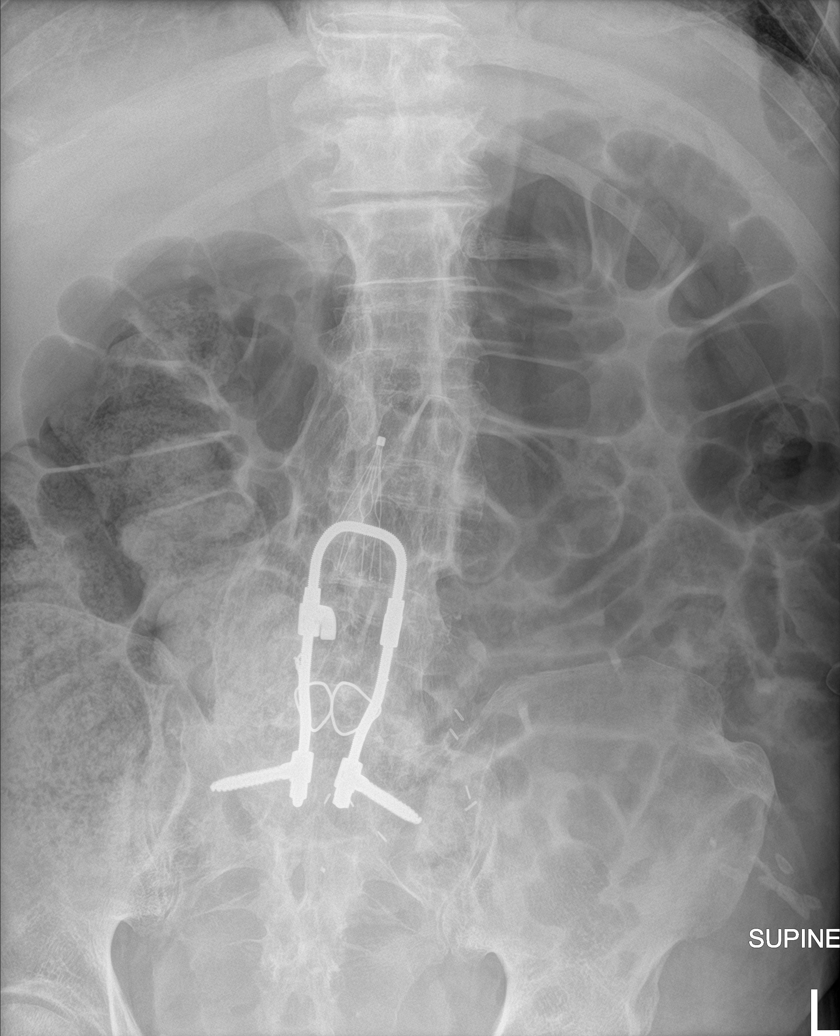

[abdomen supine (2 of 2)]
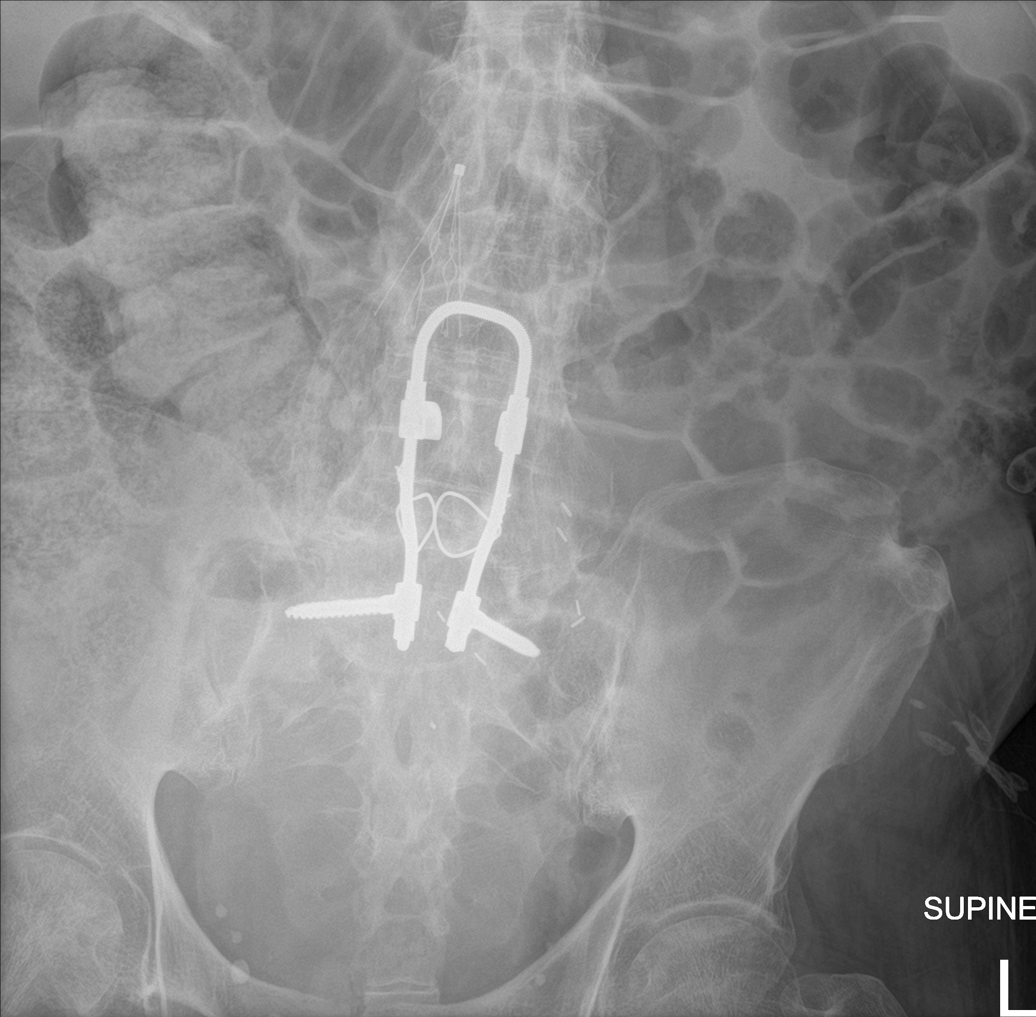

[Series 5: abdomen erect · 0.14mm/px · 2 of 2 slices shown (2 of 2)]
[im 1/2]
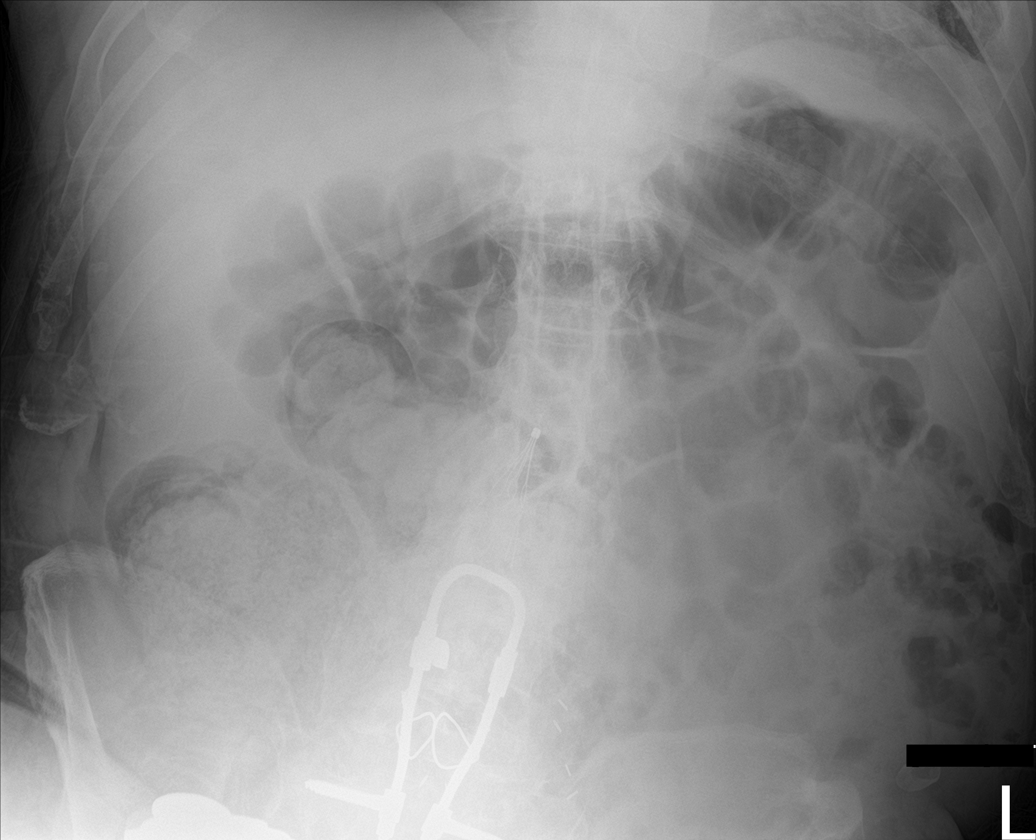
[im 2/2]
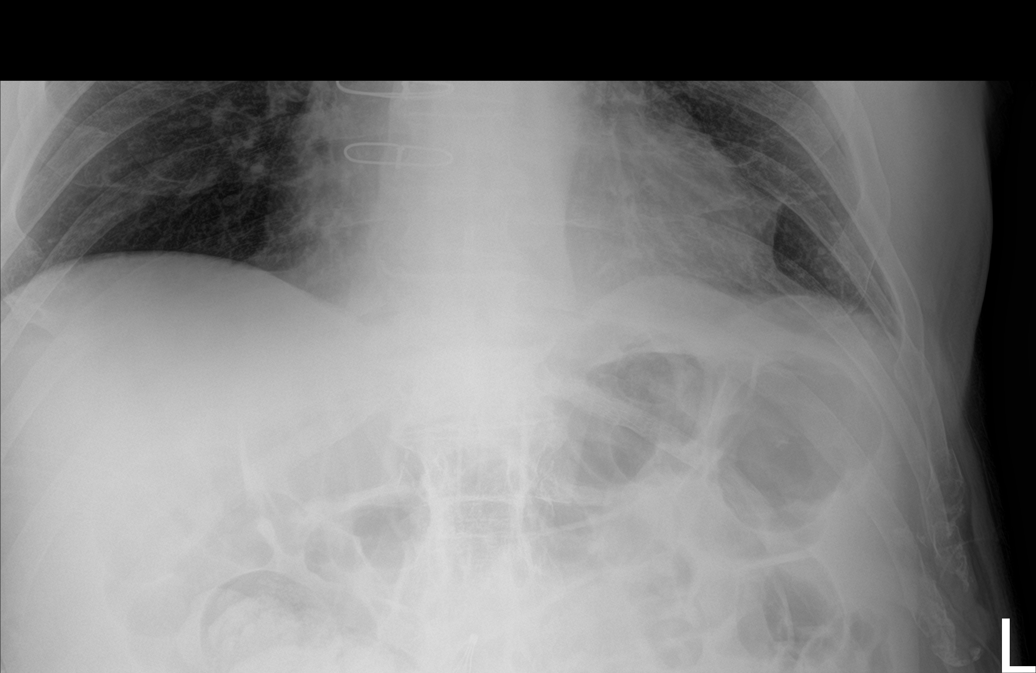

[5 of 5 positions shown; findings below may reference images not displayed]

FINDINGS: Large amount of gas and fecal matter throughout the colon. Large
amount of small bowel gas but no dilated loops. No sign of free air.
IVC filter in place. Previous lumbar fusion.
IMPRESSION: Large amount of gas and stool throughout the colon. Large amount of
small bowel gas without dilated loops. Most consistent with ileus
and or constipation. Pattern not particularly suggestive of frank
obstruction. Findings could be associated with abdominal discomfort.

## 2019-09-26 MED ORDER — OXYCODONE HCL ER 10 MG PO T12A
10.0000 mg | EXTENDED_RELEASE_TABLET | Freq: Once | ORAL | Status: AC
  Administered 2019-09-26: 10 mg via ORAL
  Filled 2019-09-26: qty 1

## 2019-09-26 MED ORDER — POLYETHYLENE GLYCOL 3350 17 G PO PACK: 17.0000 g | PACK | Freq: Two times a day (BID) | ORAL | Status: DC

## 2019-09-26 MED ORDER — POLYETHYLENE GLYCOL 3350 17 G PO PACK
17.0000 g | PACK | Freq: Three times a day (TID) | ORAL | Status: DC
  Administered 2019-09-26 – 2019-10-08 (×24): 17 g via ORAL
  Filled 2019-09-26 (×32): qty 1

## 2019-09-26 MED ORDER — OXYCODONE HCL ER 20 MG PO T12A
40.0000 mg | EXTENDED_RELEASE_TABLET | Freq: Two times a day (BID) | ORAL | Status: DC
  Administered 2019-09-26 – 2019-10-09 (×26): 40 mg via ORAL
  Filled 2019-09-26 (×15): qty 2
  Filled 2019-09-26: qty 4
  Filled 2019-09-26 (×10): qty 2

## 2019-09-26 MED ORDER — FLEET ENEMA 7-19 GM/118ML RE ENEM
1.0000 | ENEMA | Freq: Once | RECTAL | Status: AC
  Administered 2019-09-26: 1 via RECTAL
  Filled 2019-09-26: qty 1

## 2019-09-26 MED ORDER — LACTULOSE 10 GM/15ML PO SOLN
30.0000 g | Freq: Three times a day (TID) | ORAL | Status: DC | PRN
  Filled 2019-09-26: qty 45

## 2019-09-26 MED ORDER — MAGNESIUM SULFATE 2 GM/50ML IV SOLN
2.0000 g | Freq: Once | INTRAVENOUS | Status: AC
  Administered 2019-09-26: 2 g via INTRAVENOUS
  Filled 2019-09-26: qty 50

## 2019-09-26 NOTE — Progress Notes (Signed)
Patient ID: Thomas [name], male   DOB: December 16, 1950, 68 y.o.   MRN: [id number]  PROGRESS NOTE    Thomas [name]  [id number] DOB: 20-Jun-1951 DOA: 08/31/2019 PCP: Antonietta Jewel, MD   Brief Narrative:  68 year old male with history of abdominal distention, hypertension, chronic kidney disease stage III, anemia was initially admitted to [facility] for fever, chills and back pain.  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  He was transferred to [facility] for the same.  2D echo at outside hospital showed no vegetation.  ID recommended daptomycin while inpatient and dalbavancin on discharge if discharging home.  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  Patient states he cannot get MRIs -ID recommendations: Daptomycin while inpatient till 10/26/2019; recommendation for dalbavancin on discharge if discharging home (not an option at this time). -SNF refused the patient because of patient requiring daptomycin..  Prior hospitalist had discussed with ID regarding dalbavancin on SNF discharge, which would not be optimal per ID. -Check CK levels weekly -Currently has remained afebrile and hemodynamically stable.  Chronic pain syndrome -Patient follows up with the pain clinic.  He is on OxyContin and Neurontin as an outpatient. -Started on hydromorphone inpatient -Continue hydromorphone as needed.  Continue gabapentin.  OxyContin dose changed on 09/25/2019 from 20 mg 3 times a day to 30 mg twice a day.  Still complains of severe pain.  Will increase OxyContin to 40 mg twice a day.  If back pain is still severe, might have to repeat CT of  the lumbar spine.  Continue bowel regimen -We will check x-ray of the abdomen to make sure that he is not having increasing pain because of constipation.  Diabetes mellitus type 2 -On glipizide as an outpatient -Continue CBGs with SSI  Acute kidney injury on chronic kidney disease stage IV -Baseline creatinine around 2.7.  Improving, 2.68 today.  IV fluids discontinued on 09/25/2019.  Renal ultrasound was negative for hydronephrosis.  Repeat a.m. creatinine.  Hypomagnesemia -Replace.  Repeat a.m. labs  Hypothyroidism -Continue Synthroid  Essential hypertension -Continue amlodipine, clonidine, hydralazine, metoprolol  Anemia of chronic disease--stable.  Transfuse if hemoglobin is less than 7.  Left heel/foot unstageable pressure ulcer: Present on admission -Continue wound care  DVT prophylaxis: Heparin Code Status: Full Family Communication: None at bedside Disposition Plan: Might need to stay inpatient to complete IV antibiotics.  Consultants: ID  Procedures: None  Antimicrobials:  Anti-infectives (From admission, onward)   Start     Dose/Rate Route Frequency Ordered Stop   09/25/19 2000  DAPTOmycin (CUBICIN) 1,000 mg in sodium chloride 0.9 % IVPB     1,000 mg 240 mL/hr over 30 Minutes Intravenous Daily 09/25/19 0948     09/23/19 2000  DAPTOmycin (CUBICIN) 1,000 mg in sodium chloride 0.9 % IVPB  Status:  Discontinued     1,000 mg 240 mL/hr over 30 Minutes Intravenous Every 48 hours 09/22/19 1032 09/25/19 0948   09/20/19 0000  daptomycin (CUBICIN) IVPB     1,000 mg Intravenous Every 24 hours 09/20/19 0936 10/26/19 2359   09/14/19 0000  dalbavancin (DALVANCE) 500 MG SOLR  Status:  Discontinued        09/14/19 1306 09/20/19  09/09/19 0000  daptomycin (CUBICIN) IVPB  Status:  Discontinued     1,000 mg Intravenous Every 24 hours 09/09/19 0833 09/14/19    09/08/19 0000  daptomycin (CUBICIN) IVPB  Status:  Discontinued     1,000 mg Intravenous Every 24 hours 09/08/19 0922  09/09/19    09/02/19 2000  DAPTOmycin (CUBICIN) 1,000 mg in sodium chloride 0.9 % IVPB  Status:  Discontinued     1,000 mg 240 mL/hr over 30 Minutes Intravenous Every 48 hours 08/31/19 1205 09/02/19 1206   09/02/19 2000  DAPTOmycin (CUBICIN) 1,000 mg in sodium chloride 0.9 % IVPB  Status:  Discontinued     1,000 mg 240 mL/hr over 30 Minutes Intravenous Every 24 hours 09/02/19 1206 09/22/19 1032   08/31/19 1000  DAPTOmycin (CUBICIN) 750 mg in sodium chloride 0.9 % IVPB  Status:  Discontinued     750 mg 230 mL/hr over 30 Minutes Intravenous Every 48 hours 08/31/19 0414 08/31/19 1205       Subjective: Patient seen and examined at bedside.  Poor historian.  Denies worsening abdominal no fever, vomiting.  Still having significant back pain.   Objective: Vitals:   09/25/19 0537 09/25/19 1500 09/25/19 2104 09/26/19 0357  BP: (!) 142/79 128/70 134/76 122/78  Pulse: 66 (!) 52 67 69  Resp: 18 18 18 18   Temp: 98 F (36.7 C) 98.1 F (36.7 C) 98.6 F (37 C) 98.2 F (36.8 C)  TempSrc: Oral Oral Oral Oral  SpO2: 100% 99% 100% 99%  Weight:      Height:        Intake/Output Summary (Last 24 hours) at 09/26/2019 0749 Last data filed at 09/26/2019 0500 Gross per 24 hour  Intake 1060 ml  Output 2550 ml  Net -1490 ml   Filed Weights   09/20/19 0508 09/21/19 0532 09/23/19 0500  Weight: 109.6 kg 110 kg 110.7 kg    Examination:  General exam: No distress.  Looks older than stated age. Respiratory system: Bilateral decreased breath sounds at bases with scattered crackles.   Cardiovascular system: Intermittently bradycardic mildly, S1-S2 heard Gastrointestinal system: Abdomen is nondistended, soft and nontender. Normal bowel sounds heard. Extremities: No cyanosis; trace lower extremity edema.  Right BKA present.   Data Reviewed: I have personally reviewed following labs and imaging studies  CBC: Recent Labs  Lab 09/21/19 0316 09/22/19 0402 09/23/19 0454 09/24/19 0419 09/26/19 0409   WBC 6.1 5.6 4.8 4.6 5.7  NEUTROABS  --  3.2 2.7 3.0 3.5  HGB 8.1* 7.8* 7.5* 7.3* 7.6*  HCT 25.7* 24.9* 23.6* 23.1* 23.7*  MCV 95.9 95.8 95.9 96.7 94.0  PLT 180 166 158 156 [id number]   Basic Metabolic Panel: Recent Labs  Lab 09/22/19 0402 09/23/19 0454 09/24/19 0419 09/25/19 0400 09/26/19 0409  NA 137 135 137 137 137  K 5.0 4.7 4.8 4.7 4.5  CL 104 102 105 105 105  CO2 24 23 23 22 24   GLUCOSE 154* 141* 154* 146* 174*  BUN 47* 42* 39* 36* 32*  CREATININE 3.58* 3.35* 3.11* 2.87* 2.68*  CALCIUM 8.6* 8.5* 8.5* 8.7* 8.8*  MG 1.8 1.7 1.7  --  1.5*   GFR: Estimated Creatinine Clearance: 38 mL/min (A) (by C-G formula based on SCr of 2.68 mg/dL (H)). Liver Function Tests: Recent Labs  Lab 09/22/19 0402  AST 21  ALT 14  ALKPHOS 81  BILITOT 0.4  PROT 6.6  ALBUMIN 2.5*   No results for input(s): LIPASE, AMYLASE in the last 168 hours. No results  for input(s): AMMONIA in the last 168 hours. Coagulation Profile: No results for input(s): INR, PROTIME in the last 168 hours. Cardiac Enzymes: Recent Labs  Lab 09/22/19 0402  CKTOTAL 68   BNP (last 3 results) No results for input(s): PROBNP in the last 8760 hours. HbA1C: No results for input(s): HGBA1C in the last 72 hours. CBG: Recent Labs  Lab 09/24/19 2129 09/25/19 0800 09/25/19 1134 09/25/19 1630 09/25/19 2057  GLUCAP 197* 150* 235* 157* 207*   Lipid Profile: No results for input(s): CHOL, HDL, LDLCALC, TRIG, CHOLHDL, LDLDIRECT in the last 72 hours. Thyroid Function Tests: No results for input(s): TSH, T4TOTAL, FREET4, T3FREE, THYROIDAB in the last 72 hours. Anemia Panel: No results for input(s): VITAMINB12, FOLATE, FERRITIN, TIBC, IRON, RETICCTPCT in the last 72 hours. Sepsis Labs: No results for input(s): PROCALCITON, LATICACIDVEN in the last 168 hours.  No results found for this or any previous visit (from the past 240 hour(s)).       Radiology Studies: No results found.      Scheduled Meds: .  allopurinol  100 mg Oral BID  . amLODipine  10 mg Oral Daily  . Chlorhexidine Gluconate Cloth  6 each Topical Daily  . cloNIDine  0.3 mg Oral Q8H  . diclofenac  1 patch Transdermal BID  . feeding supplement (GLUCERNA SHAKE)  237 mL Oral TID BM  . feeding supplement (PRO-STAT SUGAR FREE 64)  30 mL Oral BID  . ferrous sulfate  325 mg Oral BID WC  . finasteride  5 mg Oral Daily  . gabapentin  300 mg Oral TID  . heparin  5,000 Units Subcutaneous Q8H  . hydrALAZINE  50 mg Oral Q8H  . insulin aspart  0-9 Units Subcutaneous TID WC  . levothyroxine  50 mcg Oral Q0600  . metoprolol tartrate  50 mg Oral BID  . oxyCODONE  30 mg Oral Q12H  . polyethylene glycol  17 g Oral Daily  . senna-docusate  1 tablet Oral BID  . sodium bicarbonate  650 mg Oral Daily  . sorbitol  15 mL Oral Daily  . tamsulosin  0.4 mg Oral Daily  . vitamin B-12  2,500 mcg Oral Daily   Continuous Infusions: . sodium chloride 1,000 mL (09/16/19 1820)  . DAPTOmycin (CUBICIN)  IV 1,000 mg (09/25/19 2100)          Aline August, MD [facility] 09/26/2019, 7:49 AM

## 2019-09-26 NOTE — TOC Progression Note (Signed)
Transition of Care [facility]) - Progression Note    Patient Details  Name: Thomas [name] MRN: [id number] Date of Birth: 12-03-1950  Transition of Care [facility]) CM/SW [place], [place] Phone Number: 09/26/2019, 4:45 PM  Clinical Narrative:    CSW aware pt remains inpatient for iv abx. Daptomycin currently too expensive to be managed at SNF. TOC team continues to follow.    Expected Discharge Plan: [place] Barriers to Discharge: Continued Medical Work up(IV ABX therapy, unable to d/c to SNF 2/2 Daptomycin cost)  Expected Discharge Plan and Services Expected Discharge Plan: [place] Choice: [place] arrangements for the past 2 months: Single Family Home Expected Discharge Date: 09/08/19      Readmission Risk Interventions Readmission Risk Prevention Plan 09/17/2019  Transportation Screening Complete  PCP or Specialist Appt within 5-7 Days Not Complete  Not Complete comments snf vs home  Home Care Screening Complete  Medication Review (RN CM) Complete  Some recent data might be hidden

## 2019-09-27 DIAGNOSIS — K5900 Constipation, unspecified: Secondary | ICD-10-CM

## 2019-09-27 LAB — BASIC METABOLIC PANEL
Anion gap: 11 (ref 5–15)
BUN: 28 mg/dL — ABNORMAL HIGH (ref 8–23)
CO2: 24 mmol/L (ref 22–32)
Calcium: 9 mg/dL (ref 8.9–10.3)
Chloride: 102 mmol/L (ref 98–111)
Creatinine, Ser: 2.68 mg/dL — ABNORMAL HIGH (ref 0.61–1.24)
GFR calc Af Amer: 27 mL/min — ABNORMAL LOW (ref >60)
GFR calc non Af Amer: 24 mL/min — ABNORMAL LOW (ref >60)
Glucose, Bld: 205 mg/dL — ABNORMAL HIGH (ref 70–99)
Potassium: 4.6 mmol/L (ref 3.5–5.1)
Sodium: 137 mmol/L (ref 135–145)

## 2019-09-27 LAB — GLUCOSE, CAPILLARY
Glucose-Capillary: 128 mg/dL — ABNORMAL HIGH (ref 70–99)
Glucose-Capillary: 173 mg/dL — ABNORMAL HIGH (ref 70–99)
Glucose-Capillary: 161 mg/dL — ABNORMAL HIGH (ref 70–99)
Glucose-Capillary: 209 mg/dL — ABNORMAL HIGH (ref 70–99)

## 2019-09-27 NOTE — Progress Notes (Signed)
Patient ID: Thomas [name], male   DOB: [birth year], 68 y.o.   MRN: [id number]  PROGRESS NOTE    Thomas [name]  [id number] DOB: 04/16/1951 DOA: 08/31/2019 PCP: Antonietta Jewel, MD   Brief Narrative:  68 year old male with history of abdominal distention, hypertension, chronic kidney disease stage III, anemia was initially admitted to [facility] for fever, chills and back pain.  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  He was transferred to [facility] for the same.  2D echo at outside hospital showed no vegetation.  ID recommended daptomycin while inpatient and dalbavancin on discharge if discharging home.  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  Patient states he cannot get MRIs -ID recommendations: Daptomycin while inpatient till 10/26/2019; recommendation for dalbavancin on discharge if discharging home (not an option at this time). -SNF refused the patient because of patient requiring daptomycin..  Prior hospitalist had discussed with ID regarding dalbavancin on SNF discharge, which would not be optimal per ID. -Check CK levels weekly -Currently has remained afebrile and hemodynamically stable.  Chronic pain syndrome -Patient follows up with the pain clinic.  He is on OxyContin and Neurontin as an outpatient. -Started on hydromorphone inpatient -Continue hydromorphone as needed.  Continue gabapentin.  OxyContin dose changed on 09/25/2019 from 20 mg 3 times a day to 30 mg twice a day.  Still complained of severe pain.  Subsequently increased OxyContin to 40 mg twice a day on 09/26/2019.  If back pain is still severe,  might have to repeat CT of the lumbar spine.    Constipation  -X-ray of the abdomen showed increased stool burden on 09/26/2019.  Patient received enema on 09/26/2019.  He had few bowel movements on 09/26/2019.  His pain including abdominal and back pain have improved after the same.  Continue MiraLAX but increased to 3 times a day along with senna.  Lactulose added as needed.  If back pain persists despite adequate bowel movements, might repeat CT of the lumbar spine.  Diabetes mellitus type 2 -On glipizide as an outpatient -Continue CBGs with SSI  Acute kidney injury on chronic kidney disease stage IV -Baseline creatinine around 2.7.  Improving, 2.68 today.  IV fluids discontinued on 09/25/2019.  Renal ultrasound was negative for hydronephrosis.  Repeat a.m. creatinine.  Hypomagnesemia -Check a.m. labs.  Hypothyroidism -Continue Synthroid  Essential hypertension -Continue amlodipine, clonidine, hydralazine, metoprolol  Anemia of chronic disease--stable.  Transfuse if hemoglobin is less than 7.  Left heel/foot unstageable pressure ulcer: Present on admission -Continue wound care  DVT prophylaxis: Heparin Code Status: Full Family Communication: None at bedside Disposition Plan: Might need to stay inpatient to complete IV antibiotics.  Consultants: ID  Procedures: None  Antimicrobials:  Anti-infectives (From admission, onward)   Start     Dose/Rate Route Frequency Ordered Stop   09/25/19 2000  DAPTOmycin (CUBICIN) 1,000 mg in sodium chloride 0.9 % IVPB     1,000 mg 240 mL/hr over 30 Minutes Intravenous Daily 09/25/19 0948     09/23/19 2000  DAPTOmycin (CUBICIN) 1,000 mg in sodium chloride 0.9 % IVPB  Status:  Discontinued     1,000 mg 240 mL/hr over 30 Minutes Intravenous Every 48  hours 09/22/19 1032 09/25/19 0948   09/20/19 0000  daptomycin (CUBICIN) IVPB     1,000 mg Intravenous Every 24 hours 09/20/19 0936 10/26/19 2359   09/14/19 0000  dalbavancin (DALVANCE) 500 MG SOLR   Status:  Discontinued        09/14/19 1306 09/20/19    09/09/19 0000  daptomycin (CUBICIN) IVPB  Status:  Discontinued     1,000 mg Intravenous Every 24 hours 09/09/19 0833 09/14/19    09/08/19 0000  daptomycin (CUBICIN) IVPB  Status:  Discontinued     1,000 mg Intravenous Every 24 hours 09/08/19 0922 09/09/19    09/02/19 2000  DAPTOmycin (CUBICIN) 1,000 mg in sodium chloride 0.9 % IVPB  Status:  Discontinued     1,000 mg 240 mL/hr over 30 Minutes Intravenous Every 48 hours 08/31/19 1205 09/02/19 1206   09/02/19 2000  DAPTOmycin (CUBICIN) 1,000 mg in sodium chloride 0.9 % IVPB  Status:  Discontinued     1,000 mg 240 mL/hr over 30 Minutes Intravenous Every 24 hours 09/02/19 1206 09/22/19 1032   08/31/19 1000  DAPTOmycin (CUBICIN) 750 mg in sodium chloride 0.9 % IVPB  Status:  Discontinued     750 mg 230 mL/hr over 30 Minutes Intravenous Every 48 hours 08/31/19 0414 08/31/19 1205       Subjective: Patient seen and examined at bedside.  Poor historian.  States that his back/abdominal pain has much improved after bowel movements yesterday.  No overnight fever or vomiting.   Objective: Vitals:   09/26/19 0357 09/26/19 1440 09/26/19 2230 09/27/19 0556  BP: 122/78 (!) 155/87 (!) 160/85 (!) 141/80  Pulse: 69 73 71 61  Resp: 18 18 18 15   Temp: 98.2 F (36.8 C) 97.9 F (36.6 C) 97.9 F (36.6 C) 98.2 F (36.8 C)  TempSrc: Oral Oral Oral Oral  SpO2: 99% 100% 99% 100%  Weight:      Height:        Intake/Output Summary (Last 24 hours) at 09/27/2019 0752 Last data filed at 09/27/2019 0200 Gross per 24 hour  Intake 870 ml  Output 2225 ml  Net -1355 ml   Filed Weights   09/20/19 0508 09/21/19 0532 09/23/19 0500  Weight: 109.6 kg 110 kg 110.7 kg    Examination:  General exam: No acute distress.  Poor historian.  Looks older than stated age. Respiratory system: Bilateral decreased breath sounds at bases with some scattered crackles  cardiovascular system: Rate controlled, S1-S2 heard  Gastrointestinal system: Abdomen is nondistended, soft and mildly tender in the lower quadrant. Normal bowel sounds heard. Extremities: No cyanosis; trace lower extremity edema.  Right BKA present.   Data Reviewed: I have personally reviewed following labs and imaging studies  CBC: Recent Labs  Lab 09/21/19 0316 09/22/19 0402 09/23/19 0454 09/24/19 0419 09/26/19 0409  WBC 6.1 5.6 4.8 4.6 5.7  NEUTROABS  --  3.2 2.7 3.0 3.5  HGB 8.1* 7.8* 7.5* 7.3* 7.6*  HCT 25.7* 24.9* 23.6* 23.1* 23.7*  MCV 95.9 95.8 95.9 96.7 94.0  PLT 180 166 158 156 [id number]   Basic Metabolic Panel: Recent Labs  Lab 09/22/19 0402 09/23/19 0454 09/24/19 0419 09/25/19 0400 09/26/19 0409 09/27/19 0330  NA 137 135 137 137 137 137  K 5.0 4.7 4.8 4.7 4.5 4.6  CL 104 102 105 105 105 102  CO2 24 23 23 22 24 24   GLUCOSE 154* 141* 154* 146* 174* 205*  BUN 47* 42* 39* 36* 32* 28*  CREATININE 3.58* 3.35* 3.11* 2.87* 2.68*  2.68*  CALCIUM 8.6* 8.5* 8.5* 8.7* 8.8* 9.0  MG 1.8 1.7 1.7  --  1.5*  --    GFR: Estimated Creatinine Clearance: 38 mL/min (A) (by C-G formula based on SCr of 2.68 mg/dL (H)). Liver Function Tests: Recent Labs  Lab 09/22/19 0402  AST 21  ALT 14  ALKPHOS 81  BILITOT 0.4  PROT 6.6  ALBUMIN 2.5*   No results for input(s): LIPASE, AMYLASE in the last 168 hours. No results for input(s): AMMONIA in the last 168 hours. Coagulation Profile: No results for input(s): INR, PROTIME in the last 168 hours. Cardiac Enzymes: Recent Labs  Lab 09/22/19 0402  CKTOTAL 68   BNP (last 3 results) No results for input(s): PROBNP in the last 8760 hours. HbA1C: No results for input(s): HGBA1C in the last 72 hours. CBG: Recent Labs  Lab 09/25/19 2057 09/26/19 0802 09/26/19 1252 09/26/19 1633 09/26/19 2230  GLUCAP 207* 155* 128* 144* 153*   Lipid Profile: No results for input(s): CHOL, HDL, LDLCALC, TRIG, CHOLHDL, LDLDIRECT in the last 72 hours. Thyroid Function Tests: No results for  input(s): TSH, T4TOTAL, FREET4, T3FREE, THYROIDAB in the last 72 hours. Anemia Panel: No results for input(s): VITAMINB12, FOLATE, FERRITIN, TIBC, IRON, RETICCTPCT in the last 72 hours. Sepsis Labs: No results for input(s): PROCALCITON, LATICACIDVEN in the last 168 hours.  No results found for this or any previous visit (from the past 240 hour(s)).       Radiology Studies: Dg Abd 2 Views  Result Date: 09/26/2019 CLINICAL DATA:  Abdominal pain EXAM: ABDOMEN - 2 VIEW COMPARISON:  CT 08/26/2019 FINDINGS: Large amount of gas and fecal matter throughout the colon. Large amount of small bowel gas but no dilated loops. No sign of free air. IVC filter in place. Previous lumbar fusion. IMPRESSION: Large amount of gas and stool throughout the colon. Large amount of small bowel gas without dilated loops. Most consistent with ileus and or constipation. Pattern not particularly suggestive of frank obstruction. Findings could be associated with abdominal discomfort. Electronically Signed   By: Nelson Chimes M.D.   On: 09/26/2019 12:48        Scheduled Meds: . allopurinol  100 mg Oral BID  . amLODipine  10 mg Oral Daily  . Chlorhexidine Gluconate Cloth  6 each Topical Daily  . cloNIDine  0.3 mg Oral Q8H  . diclofenac  1 patch Transdermal BID  . feeding supplement (GLUCERNA SHAKE)  237 mL Oral TID BM  . feeding supplement (PRO-STAT SUGAR FREE 64)  30 mL Oral BID  . ferrous sulfate  325 mg Oral BID WC  . finasteride  5 mg Oral Daily  . gabapentin  300 mg Oral TID  . heparin  5,000 Units Subcutaneous Q8H  . hydrALAZINE  50 mg Oral Q8H  . insulin aspart  0-9 Units Subcutaneous TID WC  . levothyroxine  50 mcg Oral Q0600  . metoprolol tartrate  50 mg Oral BID  . oxyCODONE  40 mg Oral Q12H  . polyethylene glycol  17 g Oral TID  . senna-docusate  1 tablet Oral BID  . sodium bicarbonate  650 mg Oral Daily  . sorbitol  15 mL Oral Daily  . tamsulosin  0.4 mg Oral Daily  . vitamin B-12  2,500 mcg  Oral Daily   Continuous Infusions: . sodium chloride 1,000 mL (09/16/19 1820)  . DAPTOmycin (CUBICIN)  IV 1,000 mg (09/26/19 2249)          Aline August, MD [facility] 09/27/2019, 7:52 AM

## 2019-09-27 NOTE — Progress Notes (Signed)
OT Cancellation Note  Patient Details Name: Thomas [name] MRN: [id number] DOB: 1951-08-14   Cancelled Treatment:     Attempted to work with patient bed level, however after a few exercises patient reporting too much pain. Informed patient return after he received pain medication, patient agreeable. Waited approximatley 45 minutes, however patient still refusing to get out of bed or continue exercises, in "too much pain." Re-attempt 12/9 as appropriate.  [facility] OT office: [place] 09/27/2019, 10:35 AM

## 2019-09-27 NOTE — Progress Notes (Addendum)
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 Patient transferred from [facility] for MRSA bacteremia. CT abdomen showing discitis involvling T10, T11 area.    PT Comments    Patient received in bed. Agrees to bed exercises. Reports pain 8/10 and requested pain medicine. He completed a couple of exercises and declined doing any more until after pain medicine given. Declined all bed mobility. He will continue to benefit from skilled PT while here to improve independence with mobility and strength. (Returned 45 minutes after pain medicine administered, patient continued to refuse more mobility or exercise.)     Follow Up Recommendations  SNF;Supervision for mobility/OOB     Equipment Recommendations  None recommended by PT    Recommendations for Other Services       Precautions / Restrictions Precautions Precautions: Fall Precaution Comments: contact precautions MRSA, R BKA Restrictions Weight Bearing Restrictions: No    Mobility  Bed Mobility                  Transfers                    Ambulation/Gait                 Stairs             Wheelchair Mobility    Modified Rankin (Stroke Patients Only)       Balance                                            Cognition Arousal/Alertness: Awake/alert Behavior During Therapy: WFL for tasks assessed/performed Overall Cognitive Status: Within Functional Limits for tasks assessed                                        Exercises Other Exercises Other Exercises: heels slides, hip abd x 10 reps on right only. Refuses L LE exercises  due to pain.    General Comments        Pertinent Vitals/Pain Pain Assessment: 0-10 Pain Score: 8  Pain Location: from sternum to pevlis Pain Descriptors / Indicators: Aching;Grimacing;Guarding;Moaning;Discomfort Pain Intervention(s): Limited activity within patient's tolerance;Monitored during session;Patient requesting pain meds-RN notified    Home Living                      Prior Function            PT Goals (current goals can now be found in the care plan section) Acute Rehab PT Goals Patient Stated Goal: to move when pain controlled PT Goal Formulation: With patient Time For Goal Achievement: 09/30/19 Potential to Achieve Goals: Fair Progress towards PT goals: Not progressing toward goals - comment(limited by pain)    Frequency    Min 2X/week      PT Plan Current plan remains appropriate    Co-evaluation              AM-PAC PT "6 Clicks" Mobility   Outcome  Measure  Help needed turning from your back to your side while in a flat bed without using bedrails?: A Lot Help needed moving from lying on your back to sitting on the side of a flat bed without using bedrails?: A Lot Help needed moving to and from a bed to a chair (including a wheelchair)?: A Lot Help needed standing up from a chair using your arms (e.g., wheelchair or bedside chair)?: A Lot Help needed to walk in hospital room?: A Lot Help needed climbing 3-5 steps with a railing? : Total 6 Click Score: 11    End of Session   Activity Tolerance: Patient limited by pain Patient left: in bed;with call bell/phone within reach Nurse Communication: Mobility status PT Visit Diagnosis: Muscle weakness (generalized) (M62.81);Other abnormalities of gait and mobility (R26.89);Pain Pain - Right/Left: Left Pain - part of body: (from sternum to pelvis, LLE)     Time:  -     Charges:  $Therapeutic Exercise: 8-22 mins                     Eugena Rhue, PT, GCS 09/27/19,10:11 AM

## 2019-09-28 LAB — CBC WITH DIFFERENTIAL/PLATELET
Abs Immature Granulocytes: 0.03 10*3/uL (ref 0.00–0.07)
Basophils Absolute: 0 10*3/uL (ref 0.0–0.1)
Basophils Relative: 0 %
Eosinophils Absolute: 0.2 10*3/uL (ref 0.0–0.5)
Eosinophils Relative: 5 %
HCT: 23.8 % — ABNORMAL LOW (ref 39.0–52.0)
Hemoglobin: 7.8 g/dL — ABNORMAL LOW (ref 13.0–17.0)
Immature Granulocytes: 1 %
Lymphocytes Relative: 34 %
Lymphs Abs: 1.6 10*3/uL (ref 0.7–4.0)
MCH: 30.6 pg (ref 26.0–34.0)
MCHC: 32.8 g/dL (ref 30.0–36.0)
MCV: 93.3 fL (ref 80.0–100.0)
Monocytes Absolute: 0.4 10*3/uL (ref 0.1–1.0)
Monocytes Relative: 8 %
Neutro Abs: 2.5 10*3/uL (ref 1.7–7.7)
Neutrophils Relative %: 52 %
Platelets: 170 10*3/uL (ref 150–400)
RBC: 2.55 MIL/uL — ABNORMAL LOW (ref 4.22–5.81)
RDW: 15.6 % — ABNORMAL HIGH (ref 11.5–15.5)
WBC: 4.8 10*3/uL (ref 4.0–10.5)
nRBC: 0 % (ref 0.0–0.2)

## 2019-09-28 LAB — IRON AND TIBC
Iron: 60 ug/dL (ref 45–182)
Saturation Ratios: 30 % (ref 17.9–39.5)
TIBC: 200 ug/dL — ABNORMAL LOW (ref 250–450)
UIBC: 140 ug/dL

## 2019-09-28 LAB — BASIC METABOLIC PANEL
Anion gap: 9 (ref 5–15)
BUN: 27 mg/dL — ABNORMAL HIGH (ref 8–23)
CO2: 24 mmol/L (ref 22–32)
Calcium: 8.7 mg/dL — ABNORMAL LOW (ref 8.9–10.3)
Chloride: 103 mmol/L (ref 98–111)
Creatinine, Ser: 2.6 mg/dL — ABNORMAL HIGH (ref 0.61–1.24)
GFR calc Af Amer: 29 mL/min — ABNORMAL LOW (ref >60)
GFR calc non Af Amer: 25 mL/min — ABNORMAL LOW (ref >60)
Glucose, Bld: 135 mg/dL — ABNORMAL HIGH (ref 70–99)
Potassium: 4.4 mmol/L (ref 3.5–5.1)
Sodium: 136 mmol/L (ref 135–145)

## 2019-09-28 LAB — MAGNESIUM: Magnesium: 1.8 mg/dL (ref 1.7–2.4)

## 2019-09-28 LAB — GLUCOSE, CAPILLARY
Glucose-Capillary: 143 mg/dL — ABNORMAL HIGH (ref 70–99)
Glucose-Capillary: 253 mg/dL — ABNORMAL HIGH (ref 70–99)
Glucose-Capillary: 107 mg/dL — ABNORMAL HIGH (ref 70–99)
Glucose-Capillary: 193 mg/dL — ABNORMAL HIGH (ref 70–99)

## 2019-09-28 LAB — FERRITIN: Ferritin: 252 ng/mL (ref 24–336)

## 2019-09-28 LAB — RETICULOCYTES
Immature Retic Fract: 15.7 % (ref 2.3–15.9)
RBC.: 2.68 MIL/uL — ABNORMAL LOW (ref 4.22–5.81)
Retic Count, Absolute: 72.4 10*3/uL (ref 19.0–186.0)
Retic Ct Pct: 2.7 % (ref 0.4–3.1)

## 2019-09-28 LAB — VITAMIN B12: Vitamin B-12: 3395 pg/mL — ABNORMAL HIGH (ref 180–914)

## 2019-09-28 LAB — FOLATE: Folate: 10.9 ng/mL (ref >5.9)

## 2019-09-28 LAB — CK: Total CK: 96 U/L (ref 49–397)

## 2019-09-28 MED ORDER — BISACODYL 10 MG RE SUPP
10.0000 mg | Freq: Every day | RECTAL | Status: DC
  Administered 2019-09-28 – 2019-10-08 (×9): 10 mg via RECTAL
  Filled 2019-09-28 (×12): qty 1

## 2019-09-28 MED ORDER — ENSURE MAX PROTEIN PO LIQD
11.0000 [oz_av] | Freq: Every day | ORAL | Status: DC
  Administered 2019-10-03: 11 [oz_av] via ORAL
  Filled 2019-09-28 (×7): qty 330

## 2019-09-28 MED ORDER — JUVEN PO PACK
1.0000 | PACK | Freq: Two times a day (BID) | ORAL | Status: DC
  Administered 2019-09-28 – 2019-10-08 (×18): 1 via ORAL
  Filled 2019-09-28 (×24): qty 1

## 2019-09-28 MED ORDER — TIZANIDINE HCL 2 MG PO TABS
4.0000 mg | ORAL_TABLET | Freq: Four times a day (QID) | ORAL | Status: DC | PRN
  Administered 2019-09-28 – 2019-09-29 (×4): 4 mg via ORAL
  Filled 2019-09-28 (×4): qty 2

## 2019-09-28 MED ORDER — ADULT MULTIVITAMIN W/MINERALS CH
1.0000 | ORAL_TABLET | Freq: Every day | ORAL | Status: DC
  Administered 2019-09-28 – 2019-10-09 (×12): 1 via ORAL
  Filled 2019-09-28 (×13): qty 1

## 2019-09-28 NOTE — Progress Notes (Signed)
Patient ID: Thomas [name], male   DOB: 22-Aug-1951, 68 y.o.   MRN: [id number]  PROGRESS NOTE    Thomas [name]  [id number] DOB: 02-25-1951 DOA: 08/31/2019 PCP: Antonietta Jewel, MD   Brief Narrative:  68 year old male with history of abdominal distention, hypertension, chronic kidney disease stage III, anemia was initially admitted to [facility] for fever, chills and back pain.  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  He was transferred to [facility] for the same.  2D echo at outside hospital showed no vegetation.  ID recommended daptomycin while inpatient and dalbavancin on discharge if discharging home.  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   -ID recommendations: Daptomycin while inpatient till 10/26/2019; recommendation for dalbavancin on discharge if discharging home (not an option at this time due to extremely limited mobility, no family support). -SNF declined the patient because of patient requiring daptomycin. -Previous MD discussed with ID regarding possibility of dalbavancin for SNF, unfortunately this is not felt to be an option -Monitor CK levels weekly -Patient remains hemodynamically stable and afebrile -Continue to improve mobility with aggressive PT OT  Chronic pain syndrome -Patient follows up with the pain clinic.  He is on OxyContin and Neurontin as an outpatient. -Started on hydromorphone inpatient - Continue gabapentin.  -Contin dose has been escalated through this hospitalization, now on 40 mg twice daily -Add Zanaflex  Constipation  -X-ray of the abdomen showed increased stool burden on 09/26/2019.  Patient received enema on 09/26/2019.  He had few bowel movements on  09/26/2019.  His pain including abdominal and back pain have improved after the same.  Continue MiraLAX but increased to 3 times a day along with senna.  -Add Senokot daily  Chronic normocytic anemia -Hemoglobin stable in the 7.5-8.5 range, check anemia panel  Diabetes mellitus type 2 -On glipizide as an outpatient -Continue CBGs with SSI  Acute kidney injury on chronic kidney disease stage IV -Baseline creatinine around 2.7, improved with hydration - renal ultrasound was negative for hydronephrosis.  -Creatinine stable at 2.6   Hypomagnesemia -Replace  Hypothyroidism -Continue Synthroid  Essential hypertension -Continue amlodipine, clonidine, hydralazine, metoprolol  Left heel/foot unstageable pressure ulcer: Present on admission -Continue wound care  DVT prophylaxis: Heparin Code Status: Full Family Communication: None at bedside Disposition Plan: Unfortunately no good options and slight, declined by SNF due to daptomycin and home with home health currently not feasible given extremely limited mobility and will lack of support at home  Consultants: ID  Procedures: None  Antimicrobials:  Anti-infectives (From admission, onward)   Start     Dose/Rate Route Frequency Ordered Stop   09/25/19 2000  DAPTOmycin (CUBICIN) 1,000 mg in sodium chloride 0.9 % IVPB     1,000 mg 240 mL/hr over 30 Minutes Intravenous Daily 09/25/19 0948     09/23/19 2000  DAPTOmycin (CUBICIN) 1,000 mg in sodium chloride 0.9 % IVPB  Status:  Discontinued     1,000 mg 240 mL/hr over 30 Minutes Intravenous Every 48 hours 09/22/19 1032 09/25/19 0948   09/20/19 0000  daptomycin (CUBICIN) IVPB     1,000 mg Intravenous Every 24 hours 09/20/19 0936 10/26/19 2359   09/14/19 0000  dalbavancin (DALVANCE) 500 MG SOLR  Status:  Discontinued  09/14/19 1306 09/20/19    09/09/19 0000  daptomycin (CUBICIN) IVPB  Status:  Discontinued     1,000 mg Intravenous Every 24 hours 09/09/19 0833 09/14/19    09/08/19  0000  daptomycin (CUBICIN) IVPB  Status:  Discontinued     1,000 mg Intravenous Every 24 hours 09/08/19 0922 09/09/19    09/02/19 2000  DAPTOmycin (CUBICIN) 1,000 mg in sodium chloride 0.9 % IVPB  Status:  Discontinued     1,000 mg 240 mL/hr over 30 Minutes Intravenous Every 48 hours 08/31/19 1205 09/02/19 1206   09/02/19 2000  DAPTOmycin (CUBICIN) 1,000 mg in sodium chloride 0.9 % IVPB  Status:  Discontinued     1,000 mg 240 mL/hr over 30 Minutes Intravenous Every 24 hours 09/02/19 1206 09/22/19 1032   08/31/19 1000  DAPTOmycin (CUBICIN) 750 mg in sodium chloride 0.9 % IVPB  Status:  Discontinued     750 mg 230 mL/hr over 30 Minutes Intravenous Every 48 hours 08/31/19 0414 08/31/19 1205       Subjective:  -Continues to complain of back discomfort and spasms   Objective: Vitals:   09/27/19 0556 09/27/19 2205 09/28/19 0500 09/28/19 0519  BP: (!) 141/80 138/72  (!) 146/77  Pulse: 61 (!) 53  (!) 53  Resp: 15 16  18   Temp: 98.2 F (36.8 C) 97.9 F (36.6 C)  97.7 F (36.5 C)  TempSrc: Oral Oral  Oral  SpO2: 100% 99%  98%  Weight:   107.1 kg   Height:        Intake/Output Summary (Last 24 hours) at 09/28/2019 1220 Last data filed at 09/28/2019 0040 Gross per 24 hour  Intake --  Output 1000 ml  Net -1000 ml   Filed Weights   09/21/19 0532 09/23/19 0500 09/28/19 0500  Weight: 110 kg 110.7 kg 107.1 kg    Examination:  Gen: Chronically ill-appearing male, awake, Alert, Oriented X 3, no distress HEENT: PERRLA, Neck supple, no JVD Lungs: Clear bilaterally CVS: RRR,No Gallops,Rubs or new Murmurs Abd: soft, Non tender, mildly distended, BS present Extremities: Right BKA, left foot and Prevalon boot Skin: no new rashes   Data Reviewed: I have personally reviewed following labs and imaging studies  CBC: Recent Labs  Lab 09/22/19 0402 09/23/19 0454 09/24/19 0419 09/26/19 0409 09/28/19 0330  WBC 5.6 4.8 4.6 5.7 4.8  NEUTROABS 3.2 2.7 3.0 3.5 2.5  HGB 7.8* 7.5* 7.3*  7.6* 7.8*  HCT 24.9* 23.6* 23.1* 23.7* 23.8*  MCV 95.8 95.9 96.7 94.0 93.3  PLT 166 158 156 161 [id number]   Basic Metabolic Panel: Recent Labs  Lab 09/22/19 0402 09/23/19 0454 09/24/19 0419 09/25/19 0400 09/26/19 0409 09/27/19 0330 09/28/19 0330  NA 137 135 137 137 137 137 136  K 5.0 4.7 4.8 4.7 4.5 4.6 4.4  CL 104 102 105 105 105 102 103  CO2 24 23 23 22 24 24 24   GLUCOSE 154* 141* 154* 146* 174* 205* 135*  BUN 47* 42* 39* 36* 32* 28* 27*  CREATININE 3.58* 3.35* 3.11* 2.87* 2.68* 2.68* 2.60*  CALCIUM 8.6* 8.5* 8.5* 8.7* 8.8* 9.0 8.7*  MG 1.8 1.7 1.7  --  1.5*  --  1.8   GFR: Estimated Creatinine Clearance: 36.1 mL/min (A) (by C-G formula based on SCr of 2.6 mg/dL (H)). Liver Function Tests: Recent Labs  Lab 09/22/19 0402  AST 21  ALT 14  ALKPHOS 81  BILITOT 0.4  PROT 6.6  ALBUMIN 2.5*   No results for input(s): LIPASE, AMYLASE  in the last 168 hours. No results for input(s): AMMONIA in the last 168 hours. Coagulation Profile: No results for input(s): INR, PROTIME in the last 168 hours. Cardiac Enzymes: Recent Labs  Lab 09/22/19 0402 09/28/19 0330  CKTOTAL 68 96   BNP (last 3 results) No results for input(s): PROBNP in the last 8760 hours. HbA1C: No results for input(s): HGBA1C in the last 72 hours. CBG: Recent Labs  Lab 09/27/19 1243 09/27/19 1713 09/27/19 2202 09/28/19 0750 09/28/19 1142  GLUCAP 173* 161* 209* 143* 253*   Lipid Profile: No results for input(s): CHOL, HDL, LDLCALC, TRIG, CHOLHDL, LDLDIRECT in the last 72 hours. Thyroid Function Tests: No results for input(s): TSH, T4TOTAL, FREET4, T3FREE, THYROIDAB in the last 72 hours. Anemia Panel: No results for input(s): VITAMINB12, FOLATE, FERRITIN, TIBC, IRON, RETICCTPCT in the last 72 hours. Sepsis Labs: No results for input(s): PROCALCITON, LATICACIDVEN in the last 168 hours.  No results found for this or any previous visit (from the past 240 hour(s)).       Radiology Studies: Dg Abd 2  Views  Result Date: 09/26/2019 CLINICAL DATA:  Abdominal pain EXAM: ABDOMEN - 2 VIEW COMPARISON:  CT 08/26/2019 FINDINGS: Large amount of gas and fecal matter throughout the colon. Large amount of small bowel gas but no dilated loops. No sign of free air. IVC filter in place. Previous lumbar fusion. IMPRESSION: Large amount of gas and stool throughout the colon. Large amount of small bowel gas without dilated loops. Most consistent with ileus and or constipation. Pattern not particularly suggestive of frank obstruction. Findings could be associated with abdominal discomfort. Electronically Signed   By: Nelson Chimes M.D.   On: 09/26/2019 12:48        Scheduled Meds:  allopurinol  100 mg Oral BID   amLODipine  10 mg Oral Daily   bisacodyl  10 mg Rectal Daily   Chlorhexidine Gluconate Cloth  6 each Topical Daily   cloNIDine  0.3 mg Oral Q8H   diclofenac  1 patch Transdermal BID   ferrous sulfate  325 mg Oral BID WC   finasteride  5 mg Oral Daily   gabapentin  300 mg Oral TID   heparin  5,000 Units Subcutaneous Q8H   hydrALAZINE  50 mg Oral Q8H   insulin aspart  0-9 Units Subcutaneous TID WC   levothyroxine  50 mcg Oral Q0600   metoprolol tartrate  50 mg Oral BID   multivitamin with minerals  1 tablet Oral Daily   nutrition supplement (JUVEN)  1 packet Oral BID BM   oxyCODONE  40 mg Oral Q12H   polyethylene glycol  17 g Oral TID   Ensure Max Protein  11 oz Oral QHS   senna-docusate  1 tablet Oral BID   sodium bicarbonate  650 mg Oral Daily   sorbitol  15 mL Oral Daily   tamsulosin  0.4 mg Oral Daily   vitamin B-12  2,500 mcg Oral Daily   Continuous Infusions:  sodium chloride 1,000 mL (09/16/19 1820)   DAPTOmycin (CUBICIN)  IV 1,000 mg (09/27/19 2123)          Domenic Polite, MD [facility] 09/28/2019, 12:20 PM

## 2019-09-28 NOTE — Progress Notes (Signed)
OT Cancellation Note  Patient Details Name: Thomas [name] MRN: [id number] DOB: Mar 31, 1951   Cancelled Treatment:    Reason Eval/Treat Not Completed: Pain limiting ability to participate(Pt refusing to move at this time. Pt stating "get out.")  RN aware of pt's pain level and was given something shortly after. Pt requires continued OT, but is unwilling to participate at this time.     Ebony Hail, OTR/L Acute Rehabilitation Services Pager: [phone] Office: [phone]   Jefferey Pica 09/28/2019, 1:38 PM

## 2019-09-28 NOTE — Progress Notes (Signed)
Nutrition Follow-up  RD working remotely.  DOCUMENTATION CODES:   Not applicable  INTERVENTION:   -MVI with minerals daily -Ensure Max po daily, each supplement provides 150 kcal and 30 grams of protein -Double protein portions with meals -1 packet Juven BID, each packet provides 95 calories, 2.5 grams of protein (collagen), and 9.8 grams of carbohydrate (3 grams sugar); also contains 7 grams of L-arginine and L-glutamine, 300 mg vitamin C, 15 mg vitamin E, 1.2 mcg vitamin B-12, 9.5 mg zinc, 200 mg calcium, and 1.5 g  Calcium Beta-hydroxy-Beta-methylbutyrate to support wound healing  NUTRITION DIAGNOSIS:   Increased nutrient needs related to acute illness(MRSA bacteremia) as evidenced by estimated needs.  Ongoing  GOAL:   Patient will meet greater than or equal to 90% of their needs  Progressing   MONITOR:   PO intake, Supplement acceptance, Labs, Weight trends, Skin  REASON FOR ASSESSMENT:   LOS    ASSESSMENT:   68 year old male who presented on 11/11 as a transfer from [facility]. Pt admitted with MRSA bacteremia. PMH of T2DM, HTN, CKD stage IV, anemia, right BKA. CT abdomen showing discitis involving T10, T11 area.  11/16 - s/p tunneled CVC placement  Reviewed I/O's: -1 L x 24 hours and -4.6 L since 09/14/19  UOP: 1 L x 24 hours  Attempted to speak with pt via phone, however, no answer. Pt refusing physical therapy this morning due to pain.   Pt's intake has improved. Noted meal completion 75-100%. Pt is refusing Prostat and Glucerna supplements.   Reviewed wt hx; noted pt has experienced a 10% wt loss since admission (approximately one month), which is significant for time frame.   Medications reviewed and include miralax, senokot, and vitamin B-12.   Per CSW notes, plan for SNF at discharge, however, placement is difficult secondary to cost of antibiotics.   Labs reviewed: CBGS: [id number] (inpatient orders for glycemic control are 0-9 units insulin  aspart TID with meals).   Diet Order:   Diet Order            Diet - low sodium heart healthy        Diet heart healthy/carb modified Room service appropriate? Yes with Assist; Fluid consistency: Thin  Diet effective now              EDUCATION NEEDS:   No education needs have been identified at this time  Skin:  Skin Assessment: Skin Integrity Issues: Skin Integrity Issues:: Unstageable Unstageable: left heel, left foot  Last BM:  09/26/19  Height:   Ht Readings from Last 1 Encounters:  09/05/19 6\' 6"  (1.981 m)    Weight:   Wt Readings from Last 1 Encounters:  09/28/19 107.1 kg    Ideal Body Weight:  91 kg(adjusted for right BKA)  BMI:  Body mass index is 27.29 kg/m.  Estimated Nutritional Needs:   Kcal:  2200-2400  Protein:  110-125 grams  Fluid:  >/= 2.0 L    Manus Weedman A. Jimmye Norman, RD, LDN, [place] Registered Dietitian II Certified Diabetes Care and Education Specialist Pager: [phone] After hours Pager: [phone]

## 2019-09-29 LAB — GLUCOSE, CAPILLARY
Glucose-Capillary: 144 mg/dL — ABNORMAL HIGH (ref 70–99)
Glucose-Capillary: 148 mg/dL — ABNORMAL HIGH (ref 70–99)
Glucose-Capillary: 169 mg/dL — ABNORMAL HIGH (ref 70–99)
Glucose-Capillary: 170 mg/dL — ABNORMAL HIGH (ref 70–99)

## 2019-09-29 LAB — CK: Total CK: 92 U/L (ref 49–397)

## 2019-09-29 MED ORDER — TIZANIDINE HCL 2 MG PO TABS
4.0000 mg | ORAL_TABLET | ORAL | Status: DC | PRN
  Administered 2019-09-29 – 2019-10-09 (×50): 4 mg via ORAL
  Filled 2019-09-29 (×51): qty 2

## 2019-09-29 NOTE — Progress Notes (Addendum)
Patient ID: Thomas [name], male   DOB: 1951-01-28, 68 y.o.   MRN: [id number]  PROGRESS NOTE    GREYDON BAQUET  [id number] DOB: 1951-03-11 DOA: 08/31/2019 PCP: Antonietta Jewel, MD   Brief Narrative:  68 year old male with history of chronic back pain, multiple back surgeries, narcotic dependence, hypertension, chronic kidney disease stage III, anemia was initially admitted to [facility] for fever, chills and back pain.  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  He was transferred to [facility] for the same.  2D echo at outside hospital showed no vegetation.  ID recommended daptomycin while inpatient and dalbavancin on discharge if discharging home.  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   -ID recommendations: Daptomycin while inpatient till 10/26/2019; recommendation for dalbavancin on discharge if discharging home (not an option at this time due to extremely limited mobility, no family support). -SNF declined the patient because of patient requiring daptomycin. -Previous MD discussed with ID regarding possibility of dalbavancin for SNF, unfortunately this is not felt to be an option -Monitor CK levels weekly -Patient remains hemodynamically stable and afebrile -Continue to improve mobility with aggressive PT OT, remains stable  Chronic pain syndrome -Patient follows up with the pain clinic.  He is on OxyContin and Neurontin as an outpatient. -Started on hydromorphone inpatient - DContinue gabapentin, per staff pt declining this the whole time -Contin dose has been escalated through this hospitalization, now on 40 mg twice daily -Restarted Zanaflex, dose increased -Encouraged him to work with PT   Constipation  -Treated for constipation during this hospitalization, given enemas -Continue Dulcolax suppository daily, also continue MiraLAX, Senokot, lactulose -Last BM 12/9  Chronic normocytic anemia -Hemoglobin stable in the 7.5-8.5 range, anemia panel with chronic disease -Stable, monitor  Diabetes mellitus type 2 -On glipizide as an outpatient -Continue CBGs with SSI  Acute kidney injury on chronic kidney disease stage IV -Baseline creatinine around 2.7, improved with hydration - renal ultrasound was negative for hydronephrosis.  -Creatinine stable at 2.6   Hypomagnesemia -Replace  Hypothyroidism -Continue Synthroid  Essential hypertension -Continue amlodipine, clonidine, hydralazine, metoprolol  Left heel/foot unstageable pressure ulcer: Present on admission -Continue wound care  DVT prophylaxis: Heparin Code Status: Full Family Communication: None at bedside Disposition Plan: Unfortunately no good options and slight, declined by SNF due to daptomycin and home with home health currently not feasible given extremely limited mobility and will lack of support at home  Consultants: ID  Procedures: None  Antimicrobials:  Anti-infectives (From admission, onward)   Start     Dose/Rate Route Frequency Ordered Stop   09/25/19 2000  DAPTOmycin (CUBICIN) 1,000 mg in sodium chloride 0.9 % IVPB     1,000 mg 240 mL/hr over 30 Minutes Intravenous Daily 09/25/19 0948     09/23/19 2000  DAPTOmycin (CUBICIN) 1,000 mg in sodium chloride 0.9 % IVPB  Status:  Discontinued     1,000 mg 240 mL/hr over 30 Minutes Intravenous Every 48 hours 09/22/19 1032 09/25/19 0948   09/20/19 0000  daptomycin (CUBICIN) IVPB     1,000 mg Intravenous Every 24 hours 09/20/19 0936 10/26/19 2359   09/14/19 0000  dalbavancin (DALVANCE) 500 MG SOLR  Status:  Discontinued        09/14/19 1306 09/20/19    09/09/19  0000  daptomycin (CUBICIN) IVPB  Status:  Discontinued     1,000 mg Intravenous Every 24  hours 09/09/19 0833 09/14/19    09/08/19 0000  daptomycin (CUBICIN) IVPB  Status:  Discontinued     1,000 mg Intravenous Every 24 hours 09/08/19 0922 09/09/19    09/02/19 2000  DAPTOmycin (CUBICIN) 1,000 mg in sodium chloride 0.9 % IVPB  Status:  Discontinued     1,000 mg 240 mL/hr over 30 Minutes Intravenous Every 48 hours 08/31/19 1205 09/02/19 1206   09/02/19 2000  DAPTOmycin (CUBICIN) 1,000 mg in sodium chloride 0.9 % IVPB  Status:  Discontinued     1,000 mg 240 mL/hr over 30 Minutes Intravenous Every 24 hours 09/02/19 1206 09/22/19 1032   08/31/19 1000  DAPTOmycin (CUBICIN) 750 mg in sodium chloride 0.9 % IVPB  Status:  Discontinued     750 mg 230 mL/hr over 30 Minutes Intravenous Every 48 hours 08/31/19 0414 08/31/19 1205       Subjective:  -Continues to complain of back discomfort and spasms   Objective: Vitals:   09/28/19 1631 09/28/19 2049 09/29/19 0515 09/29/19 0900  BP: 116/67 (!) 154/75 (!) 142/83   Pulse: (!) 55 (!) 55 (!) 54   Resp: 17 18 18    Temp: 98.2 F (36.8 C) 97.7 F (36.5 C) 98.1 F (36.7 C)   TempSrc: Oral Oral Oral   SpO2: 98% 97% 100%   Weight:   112 kg 111.7 kg  Height:        Intake/Output Summary (Last 24 hours) at 09/29/2019 1215 Last data filed at 09/29/2019 [id number] Gross per 24 hour  Intake 980 ml  Output 1300 ml  Net -320 ml   Filed Weights   09/28/19 0500 09/29/19 0515 09/29/19 0900  Weight: 107.1 kg 112 kg 111.7 kg    Examination:  Gen: Awake, Alert, Oriented X 3, chronically ill male, appears much older than stated age 59: PERRLA, Neck supple, no JVD Lungs: Good air movement bilaterally, CTAB CVS: RRR,No Gallops,Rubs or new Murmurs Abd: soft, Non tender, non distended, BS present Extremities: Right BKA, left foot with Prevalon boot Skin: no new rashes    Data Reviewed: I have personally reviewed following labs and imaging studies  CBC: Recent Labs  Lab 09/23/19 0454 09/24/19 0419 09/26/19 0409 09/28/19 0330  WBC  4.8 4.6 5.7 4.8  NEUTROABS 2.7 3.0 3.5 2.5  HGB 7.5* 7.3* 7.6* 7.8*  HCT 23.6* 23.1* 23.7* 23.8*  MCV 95.9 96.7 94.0 93.3  PLT 158 156 161 [id number]   Basic Metabolic Panel: Recent Labs  Lab 09/23/19 0454 09/24/19 0419 09/25/19 0400 09/26/19 0409 09/27/19 0330 09/28/19 0330  NA 135 137 137 137 137 136  K 4.7 4.8 4.7 4.5 4.6 4.4  CL 102 105 105 105 102 103  CO2 23 23 22 24 24 24   GLUCOSE 141* 154* 146* 174* 205* 135*  BUN 42* 39* 36* 32* 28* 27*  CREATININE 3.35* 3.11* 2.87* 2.68* 2.68* 2.60*  CALCIUM 8.5* 8.5* 8.7* 8.8* 9.0 8.7*  MG 1.7 1.7  --  1.5*  --  1.8   GFR: Estimated Creatinine Clearance: 39.3 mL/min (A) (by C-G formula based on SCr of 2.6 mg/dL (H)). Liver Function Tests: No results for input(s): AST, ALT, ALKPHOS, BILITOT, PROT, ALBUMIN in the last 168 hours. No results for input(s): LIPASE, AMYLASE in the last 168 hours. No results for input(s): AMMONIA in the last 168 hours. Coagulation Profile: No results for input(s): INR, PROTIME in the last  168 hours. Cardiac Enzymes: Recent Labs  Lab 09/28/19 0330 09/29/19 1029  CKTOTAL 96 92   BNP (last 3 results) No results for input(s): PROBNP in the last 8760 hours. HbA1C: No results for input(s): HGBA1C in the last 72 hours. CBG: Recent Labs  Lab 09/28/19 1142 09/28/19 1622 09/28/19 2056 09/29/19 0728 09/29/19 1213  GLUCAP 253* 107* 193* 144* 148*   Lipid Profile: No results for input(s): CHOL, HDL, LDLCALC, TRIG, CHOLHDL, LDLDIRECT in the last 72 hours. Thyroid Function Tests: No results for input(s): TSH, T4TOTAL, FREET4, T3FREE, THYROIDAB in the last 72 hours. Anemia Panel: Recent Labs    09/28/19 1620  VITAMINB12 3,395*  FOLATE 10.9  FERRITIN 252  TIBC 200*  IRON 60  RETICCTPCT 2.7   Sepsis Labs: No results for input(s): PROCALCITON, LATICACIDVEN in the last 168 hours.  No results found for this or any previous visit (from the past 240 hour(s)).       Radiology Studies: No results  found.      Scheduled Meds: . allopurinol  100 mg Oral BID  . amLODipine  10 mg Oral Daily  . bisacodyl  10 mg Rectal Daily  . Chlorhexidine Gluconate Cloth  6 each Topical Daily  . cloNIDine  0.3 mg Oral Q8H  . diclofenac  1 patch Transdermal BID  . ferrous sulfate  325 mg Oral BID WC  . finasteride  5 mg Oral Daily  . gabapentin  300 mg Oral TID  . heparin  5,000 Units Subcutaneous Q8H  . hydrALAZINE  50 mg Oral Q8H  . insulin aspart  0-9 Units Subcutaneous TID WC  . levothyroxine  50 mcg Oral Q0600  . metoprolol tartrate  50 mg Oral BID  . multivitamin with minerals  1 tablet Oral Daily  . nutrition supplement (JUVEN)  1 packet Oral BID BM  . oxyCODONE  40 mg Oral Q12H  . polyethylene glycol  17 g Oral TID  . Ensure Max Protein  11 oz Oral QHS  . senna-docusate  1 tablet Oral BID  . sodium bicarbonate  650 mg Oral Daily  . sorbitol  15 mL Oral Daily  . tamsulosin  0.4 mg Oral Daily  . vitamin B-12  2,500 mcg Oral Daily   Continuous Infusions: . sodium chloride 1,000 mL (09/28/19 2017)  . DAPTOmycin (CUBICIN)  IV 1,000 mg (09/28/19 2026)    Domenic Polite, MD [facility] 09/29/2019, 12:15 PM

## 2019-09-29 NOTE — Progress Notes (Signed)
PHARMACY CONSULT NOTE FOR:  OUTPATIENT  PARENTERAL ANTIBIOTIC THERAPY (OPAT)  Indication: MRSA vertebral infection, bacteremia Regimen: Dalbavancin 1500 mg X 1 then 500 mg Q 7 days X 3 doses End date: To be determined per infusion schedule with [place]   IV antibiotic discharge orders are pended. To discharging provider:  please sign these orders via discharge navigator,  Select New Orders & click on the button choice - Manage This Unsigned Work.     Thank you for allowing pharmacy to be a part of this patient's care.  Jimmy Footman, PharmD, BCPS, BCIDP Infectious Diseases Clinical Pharmacist Phone: [phone] 09/29/2019, 8:34 AM

## 2019-09-29 NOTE — Progress Notes (Signed)
Physical Therapy Treatment Patient Details Name: Thomas [name] MRN: [id number] DOB: 1951/09/20 Today's Date: 09/29/2019    History of Present Illness Patient is a 68 year old male history of diabetes mellitus type 2, hypertension, chronic kidney disease stage III, anemia, R BKA Sept 2019, who was admitted at [facility] after having fever chills and back pain. Patient transferred from [facility] for MRSA bacteremia. CT abdomen showing discitis involvling T10, T11 area.    PT Comments    Patient received pain medicine <30 minutes prior to PT arrival. Agreeable to try to put on prosthesis and try to stand/walk. As initiated movement, he requested additional faster-acting pain meds and RN notified. Discussed barriers to discharge and options with pt only wanting to consider discharge home. In total, pt moved from supine to side to sit ~5 times during session--when sharp pains would strike he would throw himself back into supine. (Continued education on how this fast movement is not helping his back pain).  Eventually donned prosthesis with pt sinking too far down into prosthesis with 0000000 clicks of pin capturing. Prosthesis visibly loose and pt reports "that's how I fell before--it was too loose." Patient has additional ply socks at home, but states no one has key to get in to get them. Contacted [employer] who built his prosthesis. Was only able to leave message with receptionist re: request for additional BKA socks for use while hospitalized. Left message for Fritz Pickerel at [facility] to call my pager if there were questions/concerns. Receptionist felt he would be able to deliver socks this afternoon.     Follow Up Recommendations  SNF;Supervision for mobility/OOB     Equipment Recommendations  None recommended by PT    Recommendations for Other Services       Precautions / Restrictions Precautions Precautions: Fall Precaution Comments: contact precautions MRSA, R  BKA Restrictions Weight Bearing Restrictions: No    Mobility  Bed Mobility Overal bed mobility: Needs Assistance Bed Mobility: Rolling;Sidelying to Sit;Sit to Sidelying Rolling: Modified independent (Device/Increase time) Sidelying to sit: Min guard(wit rail, moves impulsively, close guarding for safety)     Sit to sidelying: Supervision General bed mobility comments: max cues for sequencing when sharp pains occur  Transfers                 General transfer comment: unable to attempt due to very loose rt prosthesis  Ambulation/Gait                 Stairs             Wheelchair Mobility    Modified Rankin (Stroke Patients Only)       Balance Overall balance assessment: Needs assistance Sitting-balance support: Feet supported Sitting balance-[name]: Fair Sitting balance - Comments: min guard for safety, often throwing self back on bed due to pain                                    Cognition Arousal/Alertness: Awake/alert Behavior During Therapy: Anxious;Impulsive Overall Cognitive Status: No family/caregiver present to determine baseline cognitive functioning Area of Impairment: Attention;Following commands;Awareness;Problem solving;Safety/judgement                   Current Attention Level: Selective Memory: Decreased short-term memory;Decreased recall of precautions Following Commands: Follows one step commands with increased time;Follows one step commands inconsistently Safety/Judgement: Decreased awareness of safety;Decreased awareness of deficits Awareness: Anticipatory Problem  Solving: Requires verbal cues;Difficulty sequencing General Comments: Patient moves impulsively when back pain increases suddenly (i.e. throwing himself back on the bed). Educated on transitioning thru sit to side to back and pt finally on 4th episode breakthrough pain went down sideways and then rolled to his back      Exercises       General Comments General comments (skin integrity, edema, etc.): Required multiple attempts to don prosthesis (due to pain and pt's sudden return to supine, and start all over again). Ultimately prosthesis donned with pt sinking far too deep into socket. He does not have extra ply of socks here. Pt reports [employer] made his prosthesis. Called [employer] and left detailed message for Fritz Pickerel (he was not able to come to the phone) re: need for additional ply.       Pertinent Vitals/Pain Pain Assessment: Faces Faces Pain Scale: Hurts whole lot Pain Location: from sternum to pevlis Pain Descriptors / Indicators: Grimacing;Guarding;Moaning;Discomfort;Other (Comment)(pulling) Pain Intervention(s): Limited activity within patient's tolerance;Monitored during session;Premedicated before session;Patient requesting pain meds-RN notified;RN gave pain meds during session    Home Living Family/patient expects to be discharged to:: Skilled nursing facility Living Arrangements: Alone Available Help at Discharge: Neighbor;Available PRN/intermittently Type of Home: House Home Access: Stairs to enter   Home Layout: One level Home Equipment: Cane - single point;Crutches Additional Comments: Patient reports floors are not completely level, and that carpeting will not allow him to use wheelchair or walker.    Prior Function Level of Independence: Independent with assistive device(s)      Comments: patient reports ambulating with a cane and a crutch for 3 months. Reports just received R LE prosthesis prior to admission   PT Goals (current goals can now be found in the care plan section) Acute Rehab PT Goals Patient Stated Goal: to move when pain controlled Time For Goal Achievement: 09/30/19 Potential to Achieve Goals: Fair Progress towards PT goals: Progressing toward goals(towards bed mob goals)    Frequency    Min 2X/week      PT Plan Current plan remains appropriate    Co-evaluation               AM-PAC PT "6 Clicks" Mobility   Outcome Measure  Help needed turning from your back to your side while in a flat bed without using bedrails?: None Help needed moving from lying on your back to sitting on the side of a flat bed without using bedrails?: A Little Help needed moving to and from a bed to a chair (including a wheelchair)?: Total Help needed standing up from a chair using your arms (e.g., wheelchair or bedside chair)?: Total Help needed to walk in hospital room?: Total Help needed climbing 3-5 steps with a railing? : Total 6 Click Score: 11    End of Session   Activity Tolerance: Patient limited by pain;Other (comment)(limited by loose prosthesis) Patient left: in bed;with call bell/phone within reach Nurse Communication: Mobility status;Other (comment)(also contacted Case Mgr re: delay due to prosthesis) PT Visit Diagnosis: Muscle weakness (generalized) (M62.81);Other abnormalities of gait and mobility (R26.89);Pain Pain - Right/Left: Left Pain - part of body: (from sternum to pelvis, LLE)     Time: [id number] PT Time Calculation (min) (ACUTE ONLY): 57 min  Charges:  $Therapeutic Activity: 23-37 mins $Self Care/Home Management: 8-22 $Orthotics/Prosthetics Check: 8-22 mins                      Barry Brunner, PT Pager [phone]  Jeanie Cooks Adley Mazurowski 09/29/2019, 3:59 PM

## 2019-09-29 NOTE — TOC Initial Note (Signed)
Transition of Care [facility]) - Initial/Assessment Note    Patient Details  Name: Thomas [name] MRN: [id number] Date of Birth: [birth year]  Transition of Care [facility]) CM/SW Contact:    Marilu Favre, RN Phone Number: 09/29/2019, 11:35 AM  Clinical Narrative:                 Spoke to patient at bedside. At first patient was asleep and difficult to wake up. Confirmed face sheet information.   Discussed PT recommendations. Patient refusing SNF, states he lives alone and has no family or friends to assist him at home.   He has a cane and a crutch at home.   Explained possibility of going home on Dalbavancin and having a home health nurse come once a week for 3 weeks for IV infusion. However, patient would have to work with PT here to be sure he is safe to go home alone.   Patient stated he will work with PT/OT today, however, he does want to go home but is very concerned about his pain not being under control and his post discharge pain management plan.  Patient has no preference for home health agency. Cory with [facility] checking to see if [facility] can accept referral. [name] with [facility] aware.    Expected Discharge Plan: [place] Barriers to Discharge: Barriers Unresolved (comment)(no support at home , IV Cubin until 10/26/19,)   Patient Goals and CMS Choice Patient states their goals for this hospitalization and ongoing recovery are:: to go home CMS Medicare.gov Compare Post Acute Care list provided to:: Patient Choice offered to / list presented to : Patient  Expected Discharge Plan and Services Expected Discharge Plan: [place] Choice: [place] arrangements for the past 2 months: Single Family Home Expected Discharge Date: 09/08/19                                    Prior Living Arrangements/Services Living arrangements for the past 2 months: Single Family Home Lives with:: Self Patient  language and need for interpreter reviewed:: Yes Do you feel safe going back to the place where you live?: Yes      Need for Family Participation in Patient Care: Yes (Comment) Care giver support system in place?: Yes (comment) Current home services: DME Criminal Activity/Legal Involvement Pertinent to Current Situation/Hospitalization: No - Comment as needed  Activities of Daily Living Home Assistive Devices/Equipment: Crutches, Cane (specify quad or straight), Blood pressure cuff, CBG Meter, Eyeglasses, Hospital bed(Straight cane.) ADL Screening (condition at time of admission) Patient's cognitive ability adequate to safely complete daily activities?: Yes Is the patient deaf or have difficulty hearing?: No Does the patient have difficulty seeing, even when wearing glasses/contacts?: No Does the patient have difficulty concentrating, remembering, or making decisions?: No Patient able to express need for assistance with ADLs?: Yes Does the patient have difficulty dressing or bathing?: Yes Independently performs ADLs?: No Communication: Independent Dressing (OT): Needs assistance Is this a change from baseline?: Change from baseline, expected to last >3 days Grooming: Independent Feeding: Independent Bathing: Needs assistance Is this a change from baseline?: Change from baseline, expected to last >3 days Toileting: Needs assistance Is this a change from baseline?: Change from baseline, expected to last >3days In/Out Bed: Needs assistance Is this a change from baseline?: Change from baseline, expected to last >  3 days Walks in Home: Needs assistance Is this a change from baseline?: Change from baseline, expected to last >3 days Does the patient have difficulty walking or climbing stairs?: Yes Weakness of Legs: Both Weakness of Arms/Hands: None  Permission Sought/Granted Permission sought to share information with : Case Manager, Chartered certified accountant granted to  share information with : Yes, Verbal Permission Granted     Permission granted to share info w AGENCY: Home Health Agencies        Emotional Assessment Appearance:: Appears older than stated age Attitude/Demeanor/Rapport: Avoidant, Guarded Affect (typically observed): Defensive Orientation: : Oriented to Self, Oriented to Place, Oriented to  Time, Oriented to Situation Alcohol / Substance Use: Not Applicable Psych Involvement: No (comment)  Admission diagnosis:  SEPSIS Patient Active Problem List   Diagnosis Date Noted  . Pressure injury of skin 09/08/2019  . MRSA bacteremia 08/31/2019  . Discitis 08/31/2019  . Wound infection after surgery 09/29/2018  . Pulmonary emboli ([place]) 09/29/2018  . [name], right ([place]) 09/29/2018  . [name], type 2) ([place]) 09/29/2018  . Gout 09/29/2018  . HTN (hypertension) 09/29/2018  . CHF (congestive heart failure), NYHA class I, chronic, diastolic ([place]) [id number]  . GERD (gastroesophageal reflux disease) 09/29/2018   PCP:  Antonietta Jewel, MD Pharmacy:   [place], [place] - [address] [place] [address] Phone: [phone] Fax: [phone]     Social Determinants of Health ([place]) Interventions    Readmission Risk Interventions Readmission Risk Prevention Plan 09/17/2019  Transportation Screening Complete  PCP or Specialist Appt within 5-7 Days Not Complete  Not Complete comments snf vs home  Home Care Screening Complete  Medication Review (RN CM) Complete  Some recent data might be hidden

## 2019-09-30 LAB — CBC
HCT: 23.8 % — ABNORMAL LOW (ref 39.0–52.0)
Hemoglobin: 7.7 g/dL — ABNORMAL LOW (ref 13.0–17.0)
MCH: 30.7 pg (ref 26.0–34.0)
MCHC: 32.4 g/dL (ref 30.0–36.0)
MCV: 94.8 fL (ref 80.0–100.0)
Platelets: 171 10*3/uL (ref 150–400)
RBC: 2.51 MIL/uL — ABNORMAL LOW (ref 4.22–5.81)
RDW: 15.9 % — ABNORMAL HIGH (ref 11.5–15.5)
WBC: 5.4 10*3/uL (ref 4.0–10.5)
nRBC: 0 % (ref 0.0–0.2)

## 2019-09-30 LAB — BASIC METABOLIC PANEL
Anion gap: 10 (ref 5–15)
BUN: 35 mg/dL — ABNORMAL HIGH (ref 8–23)
CO2: 25 mmol/L (ref 22–32)
Calcium: 8.9 mg/dL (ref 8.9–10.3)
Chloride: 99 mmol/L (ref 98–111)
Creatinine, Ser: 2.97 mg/dL — ABNORMAL HIGH (ref 0.61–1.24)
GFR calc Af Amer: 24 mL/min — ABNORMAL LOW (ref >60)
GFR calc non Af Amer: 21 mL/min — ABNORMAL LOW (ref >60)
Glucose, Bld: 187 mg/dL — ABNORMAL HIGH (ref 70–99)
Potassium: 5 mmol/L (ref 3.5–5.1)
Sodium: 134 mmol/L — ABNORMAL LOW (ref 135–145)

## 2019-09-30 LAB — GLUCOSE, CAPILLARY
Glucose-Capillary: 204 mg/dL — ABNORMAL HIGH (ref 70–99)
Glucose-Capillary: 142 mg/dL — ABNORMAL HIGH (ref 70–99)
Glucose-Capillary: 156 mg/dL — ABNORMAL HIGH (ref 70–99)
Glucose-Capillary: 163 mg/dL — ABNORMAL HIGH (ref 70–99)

## 2019-09-30 MED ORDER — SODIUM CHLORIDE 0.9 % IV SOLN
INTRAVENOUS | Status: AC
  Administered 2019-09-30: 13:00:00 via INTRAVENOUS

## 2019-09-30 MED ORDER — CLONIDINE HCL 0.2 MG PO TABS
0.2000 mg | ORAL_TABLET | Freq: Two times a day (BID) | ORAL | Status: DC
  Administered 2019-09-30 – 2019-10-09 (×18): 0.2 mg via ORAL
  Filled 2019-09-30 (×19): qty 1

## 2019-09-30 NOTE — Progress Notes (Signed)
Physical Therapy Treatment Patient Details Name: Thomas [name] MRN: [id number] DOB: [birth year] Today's Date: 09/30/2019    History of Present Illness Patient is a 68 year old male history of diabetes mellitus type 2, hypertension, chronic kidney disease stage III, anemia, R BKA Sept 2019, who was admitted at [facility] after having fever chills and back pain. Patient transferred from [facility] for MRSA bacteremia. CT abdomen showing discitis involvling T10, T11 area.    PT Comments    Pt remains limited by pain; however, he reports pain is improving gradually.  He was able to sit EOB today with supervision and no episodes of throwing himself back down due to pain.  When sitting requiring significant weight bearing bil UE for pressure/pain relief on spine.  He declined attempting to stand.  He does have socks of prosthetic today.  Pt is familiar to this PT from prior hospitalizations at [facility], he typically progresses well once pain is controlled.      Follow Up Recommendations  SNF;Supervision for mobility/OOB(Note: pt hopeful for progression to HHPT when pain controlled)     Equipment Recommendations  None recommended by PT    Recommendations for Other Services       Precautions / Restrictions Precautions Precautions: Fall Precaution Comments: contact precautions MRSA, R BKA    Mobility  Bed Mobility Overal bed mobility: Needs Assistance Bed Mobility: Rolling;Supine to Sit;Sit to Supine Rolling: Modified independent (Device/Increase time) Sidelying to sit: Modified independent (Device/Increase time)     Sit to sidelying: Modified independent (Device/Increase time) General bed mobility comments: cues for log roll ; no episodes of jerking back today with sharp pains  Transfers                 General transfer comment: Pt declined attempting to stand due to pain/pressure on spine  Ambulation/Gait                 Stairs              Wheelchair Mobility    Modified Rankin (Stroke Patients Only)       Balance Overall balance assessment: Needs assistance Sitting-balance support: Bilateral upper extremity supported;Feet unsupported Sitting balance-[name]: Fair Sitting balance - Comments: No episodes of throwing self back on bed today; but does apply significant weight bearing in arms to relief pressure on spine       Standing balance comment: pt declined to stand                            Cognition Arousal/Alertness: Awake/alert Behavior During Therapy: [facility] for tasks assessed/performed;Impulsive Overall Cognitive Status: Within Functional Limits for tasks assessed(Pt familiar to this PT from prior hospitalization -baseline cognition today)                           Safety/Judgement: Decreased awareness of safety     General Comments: Pt reporting pain is improving but reports still not able to tolerate it to stand or walk.  He reports he is pleased with current pain medication regimen and hopefully will feel like walking/standing in a few days.  Thomas [name] from [employer] did bring socks for prosthetic for improved fit when pt agreeable to standing.  Noted in prior PT note that "Pt just received prosthetic ~month before admission"...note that this is his second prosthesis, he has been ambulating on prosthetic for at least a year.  Exercises General Exercises - Lower Extremity Long Arc Quad: Strengthening;Both;20 reps;Seated Heel Slides: AROM;Left;10 reps;Supine Hip ABduction/ADduction: AROM;Supine;Both;10 reps Other Exercises Other Exercises: knee to chest : both (one at a time), AROM, x 10 - reports helps back pain    General Comments        Pertinent Vitals/Pain Pain Assessment: 0-10 Pain Score: 8  Pain Location: entire back Pain Descriptors / Indicators: Guarding;Grimacing;Moaning;Spasm;Shooting Pain Intervention(s): Limited activity within patient's  tolerance;Premedicated before session    Home Living                      Prior Function            PT Goals (current goals can now be found in the care plan section) Progress towards PT goals: Progressing toward goals(transfer improving; OOB still limited by pain)    Frequency    Min 2X/week      PT Plan Current plan remains appropriate    Co-evaluation              AM-PAC PT "6 Clicks" Mobility   Outcome Measure  Help needed turning from your back to your side while in a flat bed without using bedrails?: None Help needed moving from lying on your back to sitting on the side of a flat bed without using bedrails?: None Help needed moving to and from a bed to a chair (including a wheelchair)?: Total Help needed standing up from a chair using your arms (e.g., wheelchair or bedside chair)?: Total Help needed to walk in hospital room?: Total Help needed climbing 3-5 steps with a railing? : Total 6 Click Score: 12    End of Session   Activity Tolerance: Patient limited by pain Patient left: in bed;with call bell/phone within reach;with bed alarm set   PT Visit Diagnosis: Muscle weakness (generalized) (M62.81);Other abnormalities of gait and mobility (R26.89);Pain     Time: [id number] PT Time Calculation (min) (ACUTE ONLY): 33 min  Charges:  $Therapeutic Exercise: 8-22 mins $Therapeutic Activity: 8-22 mins                     Thomas [name], PT Acute Rehab Services Pager [phone] [facility] [phone]    Thomas [name] 09/30/2019, 2:00 PM

## 2019-09-30 NOTE — Progress Notes (Signed)
Patient ID: Thomas [name], male   DOB: 08/12/1951, 68 y.o.   MRN: [id number]  PROGRESS NOTE    ZEDEKIAH HINDERMAN  [id number] DOB: 1951/06/28 DOA: 08/31/2019 PCP: Antonietta Jewel, MD   Brief Narrative:  68 year old male with history of chronic back pain, multiple back surgeries, narcotic dependence, hypertension, chronic kidney disease stage III, anemia was initially admitted to [facility] for fever, chills and back pain.  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  He was transferred to [facility] for the same.  2D echo at outside hospital showed no vegetation.  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   -ID recommendations: Daptomycin while inpatient till 10/26/2019; recommendation for dalbavancin on discharge if discharging home (not an option at this time due to extremely limited mobility, no family support). -SNF declined the patient because of patient requiring daptomycin. -Previous MD discussed with ID regarding possibility of dalbavancin for SNF, unfortunately this is not felt to be an option -Monitor CK levels, remains hemodynamically stable and afebrile, continue to encourage aggressive therapy to improve mobility  Chronic pain syndrome -Patient follows up with the pain clinic.  He is on OxyContin and Neurontin as an outpatient. -Started on hydromorphone inpatient -Discontinued gabapentin, per staff pt declining this the whole time -Contin dose has been  escalated through this hospitalization, now on 40 mg twice daily -Restarted Zanaflex, dose increased -Encouraged him to work with PT  Constipation  -Treated for constipation during this hospitalization, given enemas -Continue Dulcolax suppository daily, also continue MiraLAX, Senokot, lactulose -Last BM 12/9  Chronic normocytic anemia -Hemoglobin stable in the 7.5-8.5 range, anemia panel with chronic disease -Stable, monitor  Diabetes mellitus type 2 -On glipizide as an outpatient -Continue CBGs with SSI  Acute kidney injury on chronic kidney disease stage IV -Baseline creatinine around 2.7, improved with hydration - renal ultrasound was negative for hydronephrosis.  -Creatinine had been stable around 2.6, mild bump to 2.9 today, will give gentle fluids for 6 hours, monitor B met closely  Hypomagnesemia -Replaced  Hypothyroidism -Continue Synthroid  Essential hypertension -Continue amlodipine, clonidine, hydralazine, metoprolol  Left heel/foot unstageable pressure ulcer: Present on admission -Continue wound care  DVT prophylaxis: Heparin Code Status: Full Family Communication: None at bedside Disposition Plan: Unfortunately no good options and slight, declined by SNF due to daptomycin and home with home health currently not feasible given extremely limited mobility and will lack of support at home  Consultants: ID  Procedures: None  Antimicrobials:  Anti-infectives (From admission, onward)   Start     Dose/Rate Route Frequency Ordered Stop   09/25/19 2000  DAPTOmycin (CUBICIN) 1,000 mg in sodium chloride 0.9 % IVPB     1,000 mg 240 mL/hr over 30 Minutes Intravenous Daily 09/25/19 0948     09/23/19 2000  DAPTOmycin (CUBICIN) 1,000 mg in sodium chloride 0.9 % IVPB  Status:  Discontinued     1,000 mg 240 mL/hr over 30 Minutes Intravenous Every 48 hours 09/22/19 1032 09/25/19 0948   09/20/19 0000  daptomycin (CUBICIN) IVPB  1,000 mg Intravenous Every 24 hours  09/20/19 0936 10/26/19 2359   09/14/19 0000  dalbavancin (DALVANCE) 500 MG SOLR  Status:  Discontinued        09/14/19 1306 09/20/19    09/09/19 0000  daptomycin (CUBICIN) IVPB  Status:  Discontinued     1,000 mg Intravenous Every 24 hours 09/09/19 0833 09/14/19    09/08/19 0000  daptomycin (CUBICIN) IVPB  Status:  Discontinued     1,000 mg Intravenous Every 24 hours 09/08/19 0922 09/09/19    09/02/19 2000  DAPTOmycin (CUBICIN) 1,000 mg in sodium chloride 0.9 % IVPB  Status:  Discontinued     1,000 mg 240 mL/hr over 30 Minutes Intravenous Every 48 hours 08/31/19 1205 09/02/19 1206   09/02/19 2000  DAPTOmycin (CUBICIN) 1,000 mg in sodium chloride 0.9 % IVPB  Status:  Discontinued     1,000 mg 240 mL/hr over 30 Minutes Intravenous Every 24 hours 09/02/19 1206 09/22/19 1032   08/31/19 1000  DAPTOmycin (CUBICIN) 750 mg in sodium chloride 0.9 % IVPB  Status:  Discontinued     750 mg 230 mL/hr over 30 Minutes Intravenous Every 48 hours 08/31/19 0414 08/31/19 1205       Subjective: -Fast asleep after pain medicines, somnolent but easily arousable    Objective: Vitals:   09/29/19 0900 09/29/19 1511 09/29/19 2124 09/30/19 0414  BP:  126/60 127/62 132/76  Pulse:  66 64 (!) 52  Resp:  '18 18 18  ' Temp:  98.1 F (36.7 C) 99.7 F (37.6 C) 97.9 F (36.6 C)  TempSrc:  Oral Oral Oral  SpO2:  97% 99% 100%  Weight: 111.7 kg     Height:        Intake/Output Summary (Last 24 hours) at 09/30/2019 1109 Last data filed at 09/30/2019 0700 Gross per 24 hour  Intake 440 ml  Output 1500 ml  Net -1060 ml   Filed Weights   09/28/19 0500 09/29/19 0515 09/29/19 0900  Weight: 107.1 kg 112 kg 111.7 kg    Examination:  Gen: Obese chronically ill male, appears older than stated age, currently sleeping but easily arousable HEENT: PERRLA, Neck supple, no JVD Lungs: Decreased breath sounds the bases CVS: RRR,No Gallops,Rubs or new Murmurs Abd: soft, Non tender, non distended, BS  present Extremities: Right BKA, left foot with dressing, Prevalon boot Skin: no new rashes    Data Reviewed: I have personally reviewed following labs and imaging studies  CBC: Recent Labs  Lab 09/24/19 0419 09/26/19 0409 09/28/19 0330 09/30/19 0517  WBC 4.6 5.7 4.8 5.4  NEUTROABS 3.0 3.5 2.5  --   HGB 7.3* 7.6* 7.8* 7.7*  HCT 23.1* 23.7* 23.8* 23.8*  MCV 96.7 94.0 93.3 94.8  PLT 156 161 170 542   Basic Metabolic Panel: Recent Labs  Lab 09/24/19 0419 09/25/19 0400 09/26/19 0409 09/27/19 0330 09/28/19 0330 09/30/19 0517  NA 137 137 137 137 136 134*  K 4.8 4.7 4.5 4.6 4.4 5.0  CL 105 105 105 102 103 99  CO2 '23 22 24 24 24 25  ' GLUCOSE 154* 146* 174* 205* 135* 187*  BUN 39* 36* 32* 28* 27* 35*  CREATININE 3.11* 2.87* 2.68* 2.68* 2.60* 2.97*  CALCIUM 8.5* 8.7* 8.8* 9.0 8.7* 8.9  MG 1.7  --  1.5*  --  1.8  --    GFR: Estimated Creatinine Clearance: 34.4 mL/min (A) (by C-G formula based on SCr of 2.97 mg/dL (H)). Liver Function Tests: No results for input(s): AST, ALT, ALKPHOS, BILITOT,  PROT, ALBUMIN in the last 168 hours. No results for input(s): LIPASE, AMYLASE in the last 168 hours. No results for input(s): AMMONIA in the last 168 hours. Coagulation Profile: No results for input(s): INR, PROTIME in the last 168 hours. Cardiac Enzymes: Recent Labs  Lab 09/28/19 0330 09/29/19 1029  CKTOTAL 96 92   BNP (last 3 results) No results for input(s): PROBNP in the last 8760 hours. HbA1C: No results for input(s): HGBA1C in the last 72 hours. CBG: Recent Labs  Lab 09/29/19 0728 09/29/19 1213 09/29/19 1730 09/29/19 2124 09/30/19 0818  GLUCAP 144* 148* 169* 170* 204*   Lipid Profile: No results for input(s): CHOL, HDL, LDLCALC, TRIG, CHOLHDL, LDLDIRECT in the last 72 hours. Thyroid Function Tests: No results for input(s): TSH, T4TOTAL, FREET4, T3FREE, THYROIDAB in the last 72 hours. Anemia Panel: Recent Labs    09/28/19 1620  VITAMINB12 3,395*  FOLATE 10.9   FERRITIN 252  TIBC 200*  IRON 60  RETICCTPCT 2.7   Sepsis Labs: No results for input(s): PROCALCITON, LATICACIDVEN in the last 168 hours.  No results found for this or any previous visit (from the past 240 hour(s)).       Radiology Studies: No results found.      Scheduled Meds: . allopurinol  100 mg Oral BID  . amLODipine  10 mg Oral Daily  . bisacodyl  10 mg Rectal Daily  . Chlorhexidine Gluconate Cloth  6 each Topical Daily  . cloNIDine  0.3 mg Oral Q8H  . diclofenac  1 patch Transdermal BID  . ferrous sulfate  325 mg Oral BID WC  . finasteride  5 mg Oral Daily  . heparin  5,000 Units Subcutaneous Q8H  . hydrALAZINE  50 mg Oral Q8H  . insulin aspart  0-9 Units Subcutaneous TID WC  . levothyroxine  50 mcg Oral Q0600  . metoprolol tartrate  50 mg Oral BID  . multivitamin with minerals  1 tablet Oral Daily  . nutrition supplement (JUVEN)  1 packet Oral BID BM  . oxyCODONE  40 mg Oral Q12H  . polyethylene glycol  17 g Oral TID  . Ensure Max Protein  11 oz Oral QHS  . senna-docusate  1 tablet Oral BID  . sodium bicarbonate  650 mg Oral Daily  . sorbitol  15 mL Oral Daily  . tamsulosin  0.4 mg Oral Daily  . vitamin B-12  2,500 mcg Oral Daily   Continuous Infusions: . sodium chloride 1,000 mL (09/28/19 2017)  . sodium chloride    . DAPTOmycin (CUBICIN)  IV 1,000 mg (09/29/19 2023)    Domenic Polite, MD [facility] 09/30/2019, 11:09 AM

## 2019-10-01 LAB — BASIC METABOLIC PANEL
Anion gap: 12 (ref 5–15)
BUN: 39 mg/dL — ABNORMAL HIGH (ref 8–23)
CO2: 24 mmol/L (ref 22–32)
Calcium: 8.8 mg/dL — ABNORMAL LOW (ref 8.9–10.3)
Chloride: 99 mmol/L (ref 98–111)
Creatinine, Ser: 3.13 mg/dL — ABNORMAL HIGH (ref 0.61–1.24)
GFR calc Af Amer: 23 mL/min — ABNORMAL LOW (ref >60)
GFR calc non Af Amer: 20 mL/min — ABNORMAL LOW (ref >60)
Glucose, Bld: 144 mg/dL — ABNORMAL HIGH (ref 70–99)
Potassium: 4.9 mmol/L (ref 3.5–5.1)
Sodium: 135 mmol/L (ref 135–145)

## 2019-10-01 LAB — URINALYSIS, ROUTINE W REFLEX MICROSCOPIC
Bacteria, UA: NONE SEEN
Bilirubin Urine: NEGATIVE
Glucose, UA: NEGATIVE mg/dL
Hgb urine dipstick: NEGATIVE
Ketones, ur: NEGATIVE mg/dL
Nitrite: NEGATIVE
Protein, ur: 100 mg/dL — AB
Specific Gravity, Urine: 1.01 (ref 1.005–1.030)
pH: 7 (ref 5.0–8.0)

## 2019-10-01 LAB — CBC
HCT: 25.3 % — ABNORMAL LOW (ref 39.0–52.0)
Hemoglobin: 8.3 g/dL — ABNORMAL LOW (ref 13.0–17.0)
MCH: 31.1 pg (ref 26.0–34.0)
MCHC: 32.8 g/dL (ref 30.0–36.0)
MCV: 94.8 fL (ref 80.0–100.0)
Platelets: 190 10*3/uL (ref 150–400)
RBC: 2.67 MIL/uL — ABNORMAL LOW (ref 4.22–5.81)
RDW: 16 % — ABNORMAL HIGH (ref 11.5–15.5)
WBC: 5.8 10*3/uL (ref 4.0–10.5)
nRBC: 0 % (ref 0.0–0.2)

## 2019-10-01 LAB — GLUCOSE, CAPILLARY
Glucose-Capillary: 137 mg/dL — ABNORMAL HIGH (ref 70–99)
Glucose-Capillary: 176 mg/dL — ABNORMAL HIGH (ref 70–99)
Glucose-Capillary: 96 mg/dL (ref 70–99)
Glucose-Capillary: 170 mg/dL — ABNORMAL HIGH (ref 70–99)
Glucose-Capillary: 198 mg/dL — ABNORMAL HIGH (ref 70–99)

## 2019-10-01 MED ORDER — SODIUM CHLORIDE 0.9 % IV SOLN
INTRAVENOUS | Status: DC
  Administered 2019-10-01: 09:00:00 via INTRAVENOUS

## 2019-10-01 MED ORDER — SODIUM CHLORIDE 0.9 % IV SOLN
INTRAVENOUS | Status: AC
  Administered 2019-10-01: 12:00:00 via INTRAVENOUS

## 2019-10-01 NOTE — Progress Notes (Signed)
Foley cath Fr 16 inserted without problems for urinary retention. Pt voided 450 cc pre catheter, immediately emptied 800 cc cloudy urine post catheter insertion.

## 2019-10-01 NOTE — Progress Notes (Signed)
Pt stated he is not feeling well, like he wants to throw up and it feels really warm in the room.  Pt's BP was 180/101. Blood sugar was 137. Zofran given. Pt felt a little better. Notified MD. Rechecked Bp at 160/95. Will monitor pt.

## 2019-10-01 NOTE — Progress Notes (Signed)
Patient ID: Thomas [name], male   DOB: 24-May-1951, 68 y.o.   MRN: [id number]  PROGRESS NOTE    Thomas [name]  [id number] DOB: [birth year] DOA: 08/31/2019 PCP: Antonietta Jewel, MD   Brief Narrative:  68 year old male with history of chronic back pain, multiple back surgeries, narcotic dependence, hypertension, chronic kidney disease stage III, anemia was initially admitted to [facility] for fever, chills and back pain.  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  He was transferred to [facility] for the same.  2D echo at outside hospital showed no vegetation.  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   -ID recommendations: Daptomycin while inpatient till 10/26/2019; recommendation for dalbavancin on discharge if discharging home (not an option at this time due to extremely limited mobility, no family support). -SNF declined the patient because of patient requiring daptomycin. -Previous MD discussed with ID regarding possibility of dalbavancin for SNF, unfortunately this is not felt to be an option -Monitor CK levels, remains hemodynamically stable and afebrile, continue to encourage aggressive therapy to improve mobility  Chronic pain syndrome -Patient follows up with the pain clinic.  He is on OxyContin and Neurontin as an outpatient. -Started on hydromorphone inpatient -Discontinued gabapentin, per staff pt declining this the whole time -Contin dose has been  escalated through this hospitalization, now on 40 mg twice daily -Restarted Zanaflex, dose increased -Encouraged him to work with PT  Constipation  -Treated for constipation during this hospitalization, given enemas -Continue Dulcolax suppository daily, also continue MiraLAX, Senokot, lactulose -Last BM 12/11  Acute kidney injury on chronic kidney disease stage IV -Baseline creatinine around 2.7, improved with hydration - renal ultrasound was negative for hydronephrosis.  -Creatinine had been stable around 2.6, and then bumped up to 2.9 yesterday and 3.1 today, bladder scan with 900 cc of urine, suspect urinary retention was contributing to worsening kidney function, given gentle fluids, will place Foley catheter today and monitor -Monitor urine output, bmet in a.m.  Chronic normocytic anemia -Hemoglobin stable in the 7.5-8.5 range, anemia panel with chronic disease -Stable, monitor  Diabetes mellitus type 2 -On glipizide as an outpatient -Continue CBGs with SSI  Hypomagnesemia -Replaced  Hypothyroidism -Continue Synthroid  Essential hypertension -Continue amlodipine, clonidine, hydralazine, metoprolol  Left heel/foot unstageable pressure ulcer: Present on admission -Continue wound care  DVT prophylaxis: Heparin Code Status: Full Family Communication: None at bedside Disposition Plan: Unfortunately no good options and slight, declined by SNF due to daptomycin and home with home health currently not feasible given extremely limited mobility and will lack of support at home  Consultants: ID  Procedures: None  Antimicrobials:  Anti-infectives (From admission, onward)   Start     Dose/Rate Route Frequency Ordered Stop   09/25/19 2000  DAPTOmycin (CUBICIN) 1,000 mg in sodium chloride 0.9 % IVPB     1,000 mg 240 mL/hr over 30 Minutes Intravenous Daily 09/25/19 0948     09/23/19 2000  DAPTOmycin (CUBICIN) 1,000 mg in sodium chloride 0.9 % IVPB  Status:  Discontinued  1,000 mg 240 mL/hr over 30 Minutes Intravenous Every 48 hours 09/22/19 1032 09/25/19 0948   09/20/19 0000  daptomycin (CUBICIN) IVPB     1,000 mg Intravenous Every 24 hours 09/20/19 0936 10/26/19 2359   09/14/19 0000  dalbavancin (DALVANCE) 500 MG SOLR  Status:  Discontinued        09/14/19 1306 09/20/19    09/09/19 0000  daptomycin (CUBICIN) IVPB  Status:  Discontinued     1,000 mg Intravenous Every 24 hours 09/09/19 0833 09/14/19    09/08/19 0000  daptomycin (CUBICIN) IVPB  Status:  Discontinued     1,000 mg Intravenous Every 24 hours 09/08/19 0922 09/09/19    09/02/19 2000  DAPTOmycin (CUBICIN) 1,000 mg in sodium chloride 0.9 % IVPB  Status:  Discontinued     1,000 mg 240 mL/hr over 30 Minutes Intravenous Every 48 hours 08/31/19 1205 09/02/19 1206   09/02/19 2000  DAPTOmycin (CUBICIN) 1,000 mg in sodium chloride 0.9 % IVPB  Status:  Discontinued     1,000 mg 240 mL/hr over 30 Minutes Intravenous Every 24 hours 09/02/19 1206 09/22/19 1032   08/31/19 1000  DAPTOmycin (CUBICIN) 750 mg in sodium chloride 0.9 % IVPB  Status:  Discontinued     750 mg 230 mL/hr over 30 Minutes Intravenous Every 48 hours 08/31/19 0414 08/31/19 1205       Subjective: -No events overnight, motivated to work with PT, denies any dyspnea no nausea vomiting or diarrhea    Objective: Vitals:   09/30/19 1433 09/30/19 2310 10/01/19 0628 10/01/19 0650  BP: 105/66 140/64 (!) 182/101 (!) 168/95  Pulse: (!) 54 81 64   Resp: 18     Temp: 97.9 F (36.6 C) 98.7 F (37.1 C) 98.4 F (36.9 C)   TempSrc: Oral  Oral   SpO2: 100% 100% 100%   Weight: 110.4 kg     Height:        Intake/Output Summary (Last 24 hours) at 10/01/2019 1145 Last data filed at 10/01/2019 1047 Gross per 24 hour  Intake 1636.4 ml  Output 3500 ml  Net -1863.6 ml   Filed Weights   09/29/19 0515 09/29/19 0900 09/30/19 1433  Weight: 112 kg 111.7 kg 110.4 kg    Examination:  Gen: Morbidly obese chronically ill gentleman appears older  than stated age AAO x3, no distress HEENT: PERRLA, Neck supple, no JVD Lungs: Decreased breath sounds at both bases CVS: RRR,No Gallops,Rubs or new Murmurs Abd: soft, Non tender, non distended, BS present Extremities: Right BKA, left foot with dressing, Prevalon boot Skin: no new rashes  Data Reviewed: I have personally reviewed following labs and imaging studies  CBC: Recent Labs  Lab 09/26/19 0409 09/28/19 0330 09/30/19 0517 10/01/19 0351  WBC 5.7 4.8 5.4 5.8  NEUTROABS 3.5 2.5  --   --   HGB 7.6* 7.8* 7.7* 8.3*  HCT 23.7* 23.8* 23.8* 25.3*  MCV 94.0 93.3 94.8 94.8  PLT 161 170 171 [id number]   Basic Metabolic Panel: Recent Labs  Lab 09/26/19 0409 09/27/19 0330 09/28/19 0330 09/30/19 0517 10/01/19 0351  NA 137 137 136 134* 135  K 4.5 4.6 4.4 5.0 4.9  CL 105 102 103 99 99  CO2 24 24 24 25 24   GLUCOSE 174* 205* 135* 187* 144*  BUN 32* 28* 27* 35* 39*  CREATININE 2.68* 2.68* 2.60* 2.97* 3.13*  CALCIUM 8.8* 9.0 8.7* 8.9 8.8*  MG 1.5*  --  1.8  --   --    GFR: Estimated Creatinine  Clearance: 32.5 mL/min (A) (by C-G formula based on SCr of 3.13 mg/dL (H)). Liver Function Tests: No results for input(s): AST, ALT, ALKPHOS, BILITOT, PROT, ALBUMIN in the last 168 hours. No results for input(s): LIPASE, AMYLASE in the last 168 hours. No results for input(s): AMMONIA in the last 168 hours. Coagulation Profile: No results for input(s): INR, PROTIME in the last 168 hours. Cardiac Enzymes: Recent Labs  Lab 09/28/19 0330 09/29/19 1029  CKTOTAL 96 92   BNP (last 3 results) No results for input(s): PROBNP in the last 8760 hours. HbA1C: No results for input(s): HGBA1C in the last 72 hours. CBG: Recent Labs  Lab 09/30/19 1222 09/30/19 1708 09/30/19 2114 10/01/19 0629 10/01/19 0745  GLUCAP 142* 156* 163* 137* 176*   Lipid Profile: No results for input(s): CHOL, HDL, LDLCALC, TRIG, CHOLHDL, LDLDIRECT in the last 72 hours. Thyroid Function Tests: No results for input(s):  TSH, T4TOTAL, FREET4, T3FREE, THYROIDAB in the last 72 hours. Anemia Panel: Recent Labs    09/28/19 1620  VITAMINB12 3,395*  FOLATE 10.9  FERRITIN 252  TIBC 200*  IRON 60  RETICCTPCT 2.7   Sepsis Labs: No results for input(s): PROCALCITON, LATICACIDVEN in the last 168 hours.  No results found for this or any previous visit (from the past 240 hour(s)).       Radiology Studies: No results found.      Scheduled Meds: . allopurinol  100 mg Oral BID  . amLODipine  10 mg Oral Daily  . bisacodyl  10 mg Rectal Daily  . Chlorhexidine Gluconate Cloth  6 each Topical Daily  . cloNIDine  0.2 mg Oral BID  . diclofenac  1 patch Transdermal BID  . ferrous sulfate  325 mg Oral BID WC  . finasteride  5 mg Oral Daily  . heparin  5,000 Units Subcutaneous Q8H  . hydrALAZINE  50 mg Oral Q8H  . insulin aspart  0-9 Units Subcutaneous TID WC  . levothyroxine  50 mcg Oral Q0600  . metoprolol tartrate  50 mg Oral BID  . multivitamin with minerals  1 tablet Oral Daily  . nutrition supplement (JUVEN)  1 packet Oral BID BM  . oxyCODONE  40 mg Oral Q12H  . polyethylene glycol  17 g Oral TID  . Ensure Max Protein  11 oz Oral QHS  . senna-docusate  1 tablet Oral BID  . sodium bicarbonate  650 mg Oral Daily  . sorbitol  15 mL Oral Daily  . tamsulosin  0.4 mg Oral Daily  . vitamin B-12  2,500 mcg Oral Daily   Continuous Infusions: . sodium chloride Stopped (09/28/19 2026)  . sodium chloride 75 mL/hr at 10/01/19 0851  . DAPTOmycin (CUBICIN)  IV Stopped (09/30/19 2155)    Domenic Polite, MD [facility] 10/01/2019, 11:45 AM

## 2019-10-02 LAB — BASIC METABOLIC PANEL
Anion gap: 10 (ref 5–15)
BUN: 37 mg/dL — ABNORMAL HIGH (ref 8–23)
CO2: 24 mmol/L (ref 22–32)
Calcium: 8.6 mg/dL — ABNORMAL LOW (ref 8.9–10.3)
Chloride: 100 mmol/L (ref 98–111)
Creatinine, Ser: 3.01 mg/dL — ABNORMAL HIGH (ref 0.61–1.24)
GFR calc Af Amer: 24 mL/min — ABNORMAL LOW (ref >60)
GFR calc non Af Amer: 21 mL/min — ABNORMAL LOW (ref >60)
Glucose, Bld: 165 mg/dL — ABNORMAL HIGH (ref 70–99)
Potassium: 5.1 mmol/L (ref 3.5–5.1)
Sodium: 134 mmol/L — ABNORMAL LOW (ref 135–145)

## 2019-10-02 LAB — GLUCOSE, CAPILLARY
Glucose-Capillary: 136 mg/dL — ABNORMAL HIGH (ref 70–99)
Glucose-Capillary: 222 mg/dL — ABNORMAL HIGH (ref 70–99)
Glucose-Capillary: 149 mg/dL — ABNORMAL HIGH (ref 70–99)
Glucose-Capillary: 147 mg/dL — ABNORMAL HIGH (ref 70–99)

## 2019-10-02 LAB — CBC
HCT: 24.3 % — ABNORMAL LOW (ref 39.0–52.0)
Hemoglobin: 7.8 g/dL — ABNORMAL LOW (ref 13.0–17.0)
MCH: 30.7 pg (ref 26.0–34.0)
MCHC: 32.1 g/dL (ref 30.0–36.0)
MCV: 95.7 fL (ref 80.0–100.0)
Platelets: 182 10*3/uL (ref 150–400)
RBC: 2.54 MIL/uL — ABNORMAL LOW (ref 4.22–5.81)
RDW: 16.1 % — ABNORMAL HIGH (ref 11.5–15.5)
WBC: 6.7 10*3/uL (ref 4.0–10.5)
nRBC: 0 % (ref 0.0–0.2)

## 2019-10-02 NOTE — Progress Notes (Signed)
Patient seen and examined, no events overnight, Foley catheter placed yesterday afternoon due to urinary retention, immediately 800 cc of cloudy urine was obtained -Overall improving, sat up at the edge of the bed with PT on Friday, hoping to work with them today  Domenic Polite, MD

## 2019-10-03 LAB — BASIC METABOLIC PANEL
Anion gap: 11 (ref 5–15)
BUN: 37 mg/dL — ABNORMAL HIGH (ref 8–23)
CO2: 22 mmol/L (ref 22–32)
Calcium: 8.5 mg/dL — ABNORMAL LOW (ref 8.9–10.3)
Chloride: 100 mmol/L (ref 98–111)
Creatinine, Ser: 2.89 mg/dL — ABNORMAL HIGH (ref 0.61–1.24)
GFR calc Af Amer: 25 mL/min — ABNORMAL LOW (ref >60)
GFR calc non Af Amer: 22 mL/min — ABNORMAL LOW (ref >60)
Glucose, Bld: 179 mg/dL — ABNORMAL HIGH (ref 70–99)
Potassium: 5 mmol/L (ref 3.5–5.1)
Sodium: 133 mmol/L — ABNORMAL LOW (ref 135–145)

## 2019-10-03 LAB — GLUCOSE, CAPILLARY
Glucose-Capillary: 169 mg/dL — ABNORMAL HIGH (ref 70–99)
Glucose-Capillary: 148 mg/dL — ABNORMAL HIGH (ref 70–99)
Glucose-Capillary: 171 mg/dL — ABNORMAL HIGH (ref 70–99)
Glucose-Capillary: 172 mg/dL — ABNORMAL HIGH (ref 70–99)

## 2019-10-03 LAB — CBC
HCT: 25 % — ABNORMAL LOW (ref 39.0–52.0)
Hemoglobin: 8.2 g/dL — ABNORMAL LOW (ref 13.0–17.0)
MCH: 31.5 pg (ref 26.0–34.0)
MCHC: 32.8 g/dL (ref 30.0–36.0)
MCV: 96.2 fL (ref 80.0–100.0)
Platelets: 199 10*3/uL (ref 150–400)
RBC: 2.6 MIL/uL — ABNORMAL LOW (ref 4.22–5.81)
RDW: 16.2 % — ABNORMAL HIGH (ref 11.5–15.5)
WBC: 6 10*3/uL (ref 4.0–10.5)
nRBC: 0 % (ref 0.0–0.2)

## 2019-10-03 NOTE — Progress Notes (Signed)
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 Patient transferred from [facility] for MRSA bacteremia. CT abdomen showing discitis involvling T10, T11 area.    PT Comments    Pt in bed upon PT arrival, agreeable to PT session focused on functional mobility and transfers. The pt was able to demo improved bed mobility and the ability to don his prosthetic with minA while sitting EOB. The pt then demoed multiple transfers to the [facility] with min guard for safety as well as use of a RW. The pt was also able to ambulate in the room using RW and min guard for safety due to impulsivity and quick movements while managing the RW. The pt will continue to benefit from skilled PT to further address safety with functional mobility as well as activity tolerance and endurance.    Follow Up Recommendations  SNF;Supervision for mobility/OOB     Equipment Recommendations  None recommended by PT    Recommendations for Other Services       Precautions / Restrictions Precautions Precautions: Fall Precaution Comments: contact precautions MRSA, R BKA, prosthetic in room Restrictions Weight Bearing Restrictions: No    Mobility  Bed Mobility Overal bed mobility: Needs Assistance Bed Mobility: Rolling;Supine to Sit;Sit to Supine Rolling: Modified independent (Device/Increase time) Sidelying to sit: Modified independent (Device/Increase time)       General bed mobility comments: cues for log roll ; no episodes of jerking back today with sharp pains  Transfers Overall transfer level: Needs assistance Equipment used: Rolling walker (2 wheeled) Transfers: Sit to/from Stand Sit  to Stand: Min assist;Min guard         General transfer comment: Pt asked PT to move away from him multiple times during sit-stand attempts, but pt benefits from min guard from elevated surfaces and minA from low surfaces. No LOB upon coming to stand.  Ambulation/Gait Ambulation/Gait assistance: Min guard Gait Distance (Feet): 30 Feet Assistive device: Rolling walker (2 wheeled) Gait Pattern/deviations: Step-through pattern;Decreased stride length Gait velocity: impulsively quick   General Gait Details: Pt ambulated in room with min guard and use of RW, pt ambulates quickly, impulsively, and demos poor control of RW in tight spaces (often lifting RW to turn and using RW to hit objects in his way to move them).   Stairs             Wheelchair Mobility    Modified Rankin (Stroke Patients Only)       Balance Overall balance assessment: Needs assistance Sitting-balance support: Bilateral upper extremity supported;Feet unsupported Sitting balance-[name]: Fair Sitting balance - Comments: No episodes of throwing self back on bed today; but does apply significant weight bearing in arms to relief pressure on spine     Standing balance-[name]: Poor Standing balance comment: reliant on BUE to ambulate, however the pt did demo sit-stand without AD without warning at end of session without LOB                            Cognition Arousal/Alertness: Awake/alert Behavior During Therapy: [facility] for tasks assessed/performed;Impulsive Overall Cognitive Status: Within Functional Limits for tasks assessed Area of Impairment: Attention;Following commands;Awareness;Problem  solving;Safety/judgement                   Current Attention Level: Selective Memory: Decreased short-term memory;Decreased recall of precautions(unclear if decreased recall or impulsivity/decreased safety awareness) Following Commands: Follows one step commands with increased time;Follows one  step commands inconsistently Safety/Judgement: Decreased awareness of safety Awareness: Anticipatory Problem Solving: Requires verbal cues;Difficulty sequencing General Comments: Pt did not mention pain today, but continued to demo poor safety decisions through session but reports that "once he goes home he will be fine" when prompted about mobility and ADs he could use at home. Pt was able to don and problem-solve donning of prosthetic today, but had a few post LOB as well as required sig extra time to position and assist to don socks over the prosthetic.      Exercises      General Comments        Pertinent Vitals/Pain Pain Assessment: Faces Faces Pain Scale: Hurts a little bit Pain Location: back Pain Descriptors / Indicators: Grimacing;Guarding Pain Intervention(s): Limited activity within patient's tolerance;Monitored during session;Repositioned    Home Living                      Prior Function            PT Goals (current goals can now be found in the care plan section) Acute Rehab PT Goals Patient Stated Goal: to return home PT Goal Formulation: With patient Time For Goal Achievement: 10/17/19 Potential to Achieve Goals: Fair Progress towards PT goals: Progressing toward goals    Frequency    Min 2X/week      PT Plan Current plan remains appropriate    Co-evaluation              AM-PAC PT "6 Clicks" Mobility   Outcome Measure  Help needed turning from your back to your side while in a flat bed without using bedrails?: None Help needed moving from lying on your back to sitting on the side of a flat bed without using bedrails?: None Help needed moving to and from a bed to a chair (including a wheelchair)?: A Little Help needed standing up from a chair using your arms (e.g., wheelchair or bedside chair)?: A Little Help needed to walk in hospital room?: A Little Help needed climbing 3-5 steps with a railing? : A Lot 6 Click Score: 19     End of Session Equipment Utilized During Treatment: Gait belt Activity Tolerance: Patient limited by pain Patient left: in bed;with call bell/phone within reach;with bed alarm set(pt sitting EOB) Nurse Communication: Mobility status;Other (comment)(pt sitting EOB with bed alarm on) PT Visit Diagnosis: Muscle weakness (generalized) (M62.81);Other abnormalities of gait and mobility (R26.89);Pain Pain - Right/Left: Left Pain - part of body: (back)     Time: 0950-1030 PT Time Calculation (min) (ACUTE ONLY): 40 min  Charges:  $Gait Training: 8-22 mins $Therapeutic Activity: 23-37 mins                     Karma Ganja, PT, DPT   Acute Rehabilitation Department [phone]   Otho Bellows 10/03/2019, 11:14 AM

## 2019-10-03 NOTE — TOC Progression Note (Addendum)
Transition of Care [facility]) - Progression Note    Patient Details  Name: Thomas [name] MRN: [id number] Date of Birth: 25-Mar-1951  Transition of Care [facility]) CM/SW Contact  Kamarri Fischetti, Edson Snowball, RN Phone Number: 10/03/2019, 2:35 PM  Clinical Narrative:     Received message from MD that patient is more ambulatory and if he continues to improve may be able to discharge to home with IV ABX.   Last note of home infusions was 12/10  Dalbavancin 1500 mg X 1 then 500 mg Q 7 days X 3 doses End date: To be determined per infusion schedule with [place] with patient. Patient states that his pain is not uncontrol. Once pain is more  Manageable he believes he will be able to ambulate more. Patient is thinking he may be able to discharge Wednesday or Thursday of this week. He will get in contact with his friend and brother to get the keys to his apartment.   Have notified [name] with [facility] and [facility] with [facility]. Cory checking on staff. Of course will need to know the extra dates of infuses at home. Cory with [facility] can accept referral if discharged on 12/16 or 12/17   Expected Discharge Plan: [place] Barriers to Discharge: Barriers Unresolved (comment)(no support at home , IV Cubin until 10/26/19,)  Expected Discharge Plan and Services Expected Discharge Plan: [place] Choice: [place] arrangements for the past 2 months: Single Family Home Expected Discharge Date: 09/08/19                                     Social Determinants of Health ([place]) Interventions    Readmission Risk Interventions Readmission Risk Prevention Plan 09/17/2019  Transportation Screening Complete  PCP or Specialist Appt within 5-7 Days Not Complete  Not Complete comments snf vs home  Home Care Screening Complete  Medication Review (RN CM) Complete  Some recent data might be hidden

## 2019-10-03 NOTE — Progress Notes (Signed)
Patient ID: Thomas [name], male   DOB: 07-Dec-1950, 68 y.o.   MRN: [id number]  PROGRESS NOTE    Thomas [name]  [id number] DOB: 07/09/1951 DOA: 08/31/2019 PCP: Antonietta Jewel, MD   Brief Narrative:  68 year old male with history of chronic back pain, multiple back surgeries, narcotic dependence, hypertension, chronic kidney disease stage III, anemia was initially admitted to [facility] for fever, chills and back pain.  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  He was transferred to [facility] for the same.  2D echo at outside hospital showed no vegetation.  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   -ID recommendations: Daptomycin while inpatient till 10/26/2019; recommendation for dalbavancin on discharge if discharging home, this was not felt to be an option until now due to extremely limited mobility, no family support. -SNF declined the patient because of patient requiring daptomycin. -Previous MD discussed with ID regarding possibility of dalbavancin for SNF, unfortunately this is not felt to be an option -Remains hemodynamically stable and afebrile, now finally mobility is starting to improve, with patient is able to walk a little in his room today, he is considering home health, will attempt to see if home health services can be coordinated for discharge home this week  Chronic pain syndrome -Patient follows up with the pain clinic.  He is on OxyContin and  Neurontin as an outpatient. -Started on hydromorphone inpatient -Discontinued gabapentin, per staff pt declining this the whole time -Contin dose has been escalated through this hospitalization, now on 40 mg twice daily -Restarted Zanaflex, dose increased -Encouraged him to work with PT  Constipation  -Treated for constipation during this hospitalization, given enemas -Continue Dulcolax suppository daily, also continue MiraLAX, Senokot, lactulose -Last BM 12/11  Acute kidney injury on chronic kidney disease stage IV -Baseline creatinine around 2.7, improved with hydration - renal ultrasound was negative for hydronephrosis.  -Creatinine had been stable around 2.6, and then bumped up to 2.9 yesterday and 3.1 today, bladder scan with 900 cc of urine, suspect urinary retention was contributing to worsening kidney function, given gentle fluids Foley catheter placed due to significant urinary retention, creatinine improving  Chronic normocytic anemia -Hemoglobin stable in the 7.5-8.5 range, anemia panel with chronic disease -Stable, monitor  Diabetes mellitus type 2 -On glipizide as an outpatient -Continue CBGs with SSI  Hypomagnesemia -Replaced  Hypothyroidism -Continue Synthroid  Essential hypertension -Continue amlodipine, clonidine, hydralazine, metoprolol  Left heel/foot unstageable pressure ulcer: Present on admission -Continue wound care  DVT prophylaxis: Heparin Code Status: Full Family Communication: None at bedside Disposition Plan: Declined by SNF, now considering home health again  Consultants: ID  Procedures: None  Antimicrobials:  Anti-infectives (From admission, onward)   Start     Dose/Rate Route Frequency Ordered Stop   09/25/19 2000  DAPTOmycin (CUBICIN) 1,000 mg in sodium chloride 0.9 % IVPB     1,000 mg 240 mL/hr over 30 Minutes Intravenous Daily 09/25/19 0948     09/23/19 2000  DAPTOmycin (CUBICIN) 1,000 mg in sodium chloride 0.9 % IVPB  Status:   Discontinued     1,000 mg 240 mL/hr over 30 Minutes Intravenous Every 48 hours 09/22/19 1032 09/25/19 0948   09/20/19 0000  daptomycin (CUBICIN) IVPB     1,000 mg Intravenous Every 24 hours 09/20/19 0936 10/26/19 2359   09/14/19 0000  dalbavancin (DALVANCE) 500 MG SOLR  Status:  Discontinued        09/14/19 1306 09/20/19    09/09/19 0000  daptomycin (CUBICIN) IVPB  Status:  Discontinued     1,000 mg Intravenous Every 24 hours 09/09/19 0833 09/14/19    09/08/19 0000  daptomycin (CUBICIN) IVPB  Status:  Discontinued     1,000 mg Intravenous Every 24 hours 09/08/19 0922 09/09/19    09/02/19 2000  DAPTOmycin (CUBICIN) 1,000 mg in sodium chloride 0.9 % IVPB  Status:  Discontinued     1,000 mg 240 mL/hr over 30 Minutes Intravenous Every 48 hours 08/31/19 1205 09/02/19 1206   09/02/19 2000  DAPTOmycin (CUBICIN) 1,000 mg in sodium chloride 0.9 % IVPB  Status:  Discontinued     1,000 mg 240 mL/hr over 30 Minutes Intravenous Every 24 hours 09/02/19 1206 09/22/19 1032   08/31/19 1000  DAPTOmycin (CUBICIN) 750 mg in sodium chloride 0.9 % IVPB  Status:  Discontinued     750 mg 230 mL/hr over 30 Minutes Intravenous Every 48 hours 08/31/19 0414 08/31/19 1205       Subjective: -No events overnight, walked a little bit in the room with PT today    Objective: Vitals:   10/02/19 0620 10/02/19 1207 10/02/19 2129 10/03/19 0545  BP: 132/77 (!) 135/58 (!) 149/78 123/76  Pulse: (!) 52 (!) 54 (!) 59 66  Resp: 18 16 18 18   Temp: 98.1 F (36.7 C) (!) 97.5 F (36.4 C) 98.1 F (36.7 C) 97.9 F (36.6 C)  TempSrc: Oral Axillary Axillary Oral  SpO2: 99% 98% 99% 100%  Weight:      Height:        Intake/Output Summary (Last 24 hours) at 10/03/2019 1434 Last data filed at 10/03/2019 0600 Gross per 24 hour  Intake 802 ml  Output 2100 ml  Net -1298 ml   Filed Weights   09/29/19 0515 09/29/19 0900 09/30/19 1433  Weight: 112 kg 111.7 kg 110.4 kg    Examination:  Gen: Morbidly obese chronically ill  gentleman, appears older than stated age, AAOx3, no distress HEENT: PERRLA, Neck supple, no JVD Lungs: Clear CVS: RRR,No Gallops,Rubs or new Murmurs Abd: soft, Non tender, non distended, BS present Extremities: Right BKA, left foot with dressing, Prevalon boot Skin: no new rashes  Data Reviewed: I have personally reviewed following labs and imaging studies  CBC: Recent Labs  Lab 09/28/19 0330 09/30/19 0517 10/01/19 0351 10/02/19 0340 10/03/19 0316  WBC 4.8 5.4 5.8 6.7 6.0  NEUTROABS 2.5  --   --   --   --   HGB 7.8* 7.7* 8.3* 7.8* 8.2*  HCT 23.8* 23.8* 25.3* 24.3* 25.0*  MCV 93.3 94.8 94.8 95.7 96.2  PLT 170 171 190 182 [id number]   Basic Metabolic Panel: Recent Labs  Lab 09/28/19 0330 09/30/19 0517 10/01/19 0351 10/02/19 0340 10/03/19 0316  NA 136 134* 135 134* 133*  K 4.4 5.0 4.9 5.1 5.0  CL 103 99 99 100 100  CO2 24 25 24 24 22   GLUCOSE 135* 187* 144* 165* 179*  BUN 27* 35* 39* 37* 37*  CREATININE 2.60* 2.97* 3.13* 3.01* 2.89*  CALCIUM 8.7* 8.9 8.8* 8.6* 8.5*  MG  1.8  --   --   --   --    GFR: Estimated Creatinine Clearance: 35.2 mL/min (A) (by C-G formula based on SCr of 2.89 mg/dL (H)). Liver Function Tests: No results for input(s): AST, ALT, ALKPHOS, BILITOT, PROT, ALBUMIN in the last 168 hours. No results for input(s): LIPASE, AMYLASE in the last 168 hours. No results for input(s): AMMONIA in the last 168 hours. Coagulation Profile: No results for input(s): INR, PROTIME in the last 168 hours. Cardiac Enzymes: Recent Labs  Lab 09/28/19 0330 09/29/19 1029  CKTOTAL 96 92   BNP (last 3 results) No results for input(s): PROBNP in the last 8760 hours. HbA1C: No results for input(s): HGBA1C in the last 72 hours. CBG: Recent Labs  Lab 10/02/19 1206 10/02/19 1716 10/02/19 2134 10/03/19 0835 10/03/19 1232  GLUCAP 222* 149* 147* 169* 148*   Lipid Profile: No results for input(s): CHOL, HDL, LDLCALC, TRIG, CHOLHDL, LDLDIRECT in the last 72 hours. Thyroid  Function Tests: No results for input(s): TSH, T4TOTAL, FREET4, T3FREE, THYROIDAB in the last 72 hours. Anemia Panel: No results for input(s): VITAMINB12, FOLATE, FERRITIN, TIBC, IRON, RETICCTPCT in the last 72 hours. Sepsis Labs: No results for input(s): PROCALCITON, LATICACIDVEN in the last 168 hours.  No results found for this or any previous visit (from the past 240 hour(s)).       Radiology Studies: No results found.      Scheduled Meds: . allopurinol  100 mg Oral BID  . amLODipine  10 mg Oral Daily  . bisacodyl  10 mg Rectal Daily  . Chlorhexidine Gluconate Cloth  6 each Topical Daily  . cloNIDine  0.2 mg Oral BID  . ferrous sulfate  325 mg Oral BID WC  . finasteride  5 mg Oral Daily  . heparin  5,000 Units Subcutaneous Q8H  . hydrALAZINE  50 mg Oral Q8H  . insulin aspart  0-9 Units Subcutaneous TID WC  . levothyroxine  50 mcg Oral Q0600  . metoprolol tartrate  50 mg Oral BID  . multivitamin with minerals  1 tablet Oral Daily  . nutrition supplement (JUVEN)  1 packet Oral BID BM  . oxyCODONE  40 mg Oral Q12H  . polyethylene glycol  17 g Oral TID  . Ensure Max Protein  11 oz Oral QHS  . senna-docusate  1 tablet Oral BID  . sorbitol  15 mL Oral Daily  . tamsulosin  0.4 mg Oral Daily  . vitamin B-12  2,500 mcg Oral Daily   Continuous Infusions: . sodium chloride Stopped (09/28/19 2026)  . DAPTOmycin (CUBICIN)  IV Stopped (10/02/19 2158)    Domenic Polite, MD [facility] 10/03/2019, 2:34 PM

## 2019-10-04 LAB — CBC
HCT: 25 % — ABNORMAL LOW (ref 39.0–52.0)
Hemoglobin: 7.9 g/dL — ABNORMAL LOW (ref 13.0–17.0)
MCH: 30.6 pg (ref 26.0–34.0)
MCHC: 31.6 g/dL (ref 30.0–36.0)
MCV: 96.9 fL (ref 80.0–100.0)
Platelets: 200 10*3/uL (ref 150–400)
RBC: 2.58 MIL/uL — ABNORMAL LOW (ref 4.22–5.81)
RDW: 16.3 % — ABNORMAL HIGH (ref 11.5–15.5)
WBC: 5.8 10*3/uL (ref 4.0–10.5)
nRBC: 0 % (ref 0.0–0.2)

## 2019-10-04 LAB — BASIC METABOLIC PANEL
Anion gap: 9 (ref 5–15)
BUN: 37 mg/dL — ABNORMAL HIGH (ref 8–23)
CO2: 24 mmol/L (ref 22–32)
Calcium: 8.7 mg/dL — ABNORMAL LOW (ref 8.9–10.3)
Chloride: 103 mmol/L (ref 98–111)
Creatinine, Ser: 3.04 mg/dL — ABNORMAL HIGH (ref 0.61–1.24)
GFR calc Af Amer: 24 mL/min — ABNORMAL LOW (ref >60)
GFR calc non Af Amer: 20 mL/min — ABNORMAL LOW (ref >60)
Glucose, Bld: 154 mg/dL — ABNORMAL HIGH (ref 70–99)
Potassium: 4.6 mmol/L (ref 3.5–5.1)
Sodium: 136 mmol/L (ref 135–145)

## 2019-10-04 LAB — GLUCOSE, CAPILLARY
Glucose-Capillary: 151 mg/dL — ABNORMAL HIGH (ref 70–99)
Glucose-Capillary: 113 mg/dL — ABNORMAL HIGH (ref 70–99)
Glucose-Capillary: 163 mg/dL — ABNORMAL HIGH (ref 70–99)
Glucose-Capillary: 123 mg/dL — ABNORMAL HIGH (ref 70–99)

## 2019-10-04 MED ORDER — DAPTOMYCIN IV (FOR PTA / DISCHARGE USE ONLY): 1000.0000 mg | INTRAVENOUS | 0 refills | Status: DC

## 2019-10-04 NOTE — Plan of Care (Signed)
  Problem: Nutrition: Goal: Adequate nutrition will be maintained Outcome: Progressing   Problem: Coping: Goal: Level of anxiety will decrease Outcome: Progressing   Problem: Safety: Goal: Ability to remain free from injury will improve Outcome: Progressing   Problem: Skin Integrity: Goal: Risk for impaired skin integrity will decrease Outcome: Progressing   

## 2019-10-04 NOTE — Progress Notes (Signed)
Nutrition Follow-up  DOCUMENTATION CODES:   Not applicable  INTERVENTION:   -D/c Ensure Max -Continue MVI with minerals daily -Continue 1 packet Juven BID, each packet provides 95 calories, 2.5 grams of protein (collagen), and 9.8 grams of carbohydrate (3 grams sugar); also contains 7 grams of L-arginine and L-glutamine, 300 mg vitamin C, 15 mg vitamin E, 1.2 mcg vitamin B-12, 9.5 mg zinc, 200 mg calcium, and 1.5 g  Calcium Beta-hydroxy-Beta-methylbutyrate to support wound healing -Double protein portions with meals  NUTRITION DIAGNOSIS:   Increased nutrient needs related to acute illness(MRSA bacteremia) as evidenced by estimated needs.  Ongoing  GOAL:   Patient will meet greater than or equal to 90% of their needs  Progressing   MONITOR:   PO intake, Supplement acceptance, Labs, Weight trends, Skin  REASON FOR ASSESSMENT:   LOS    ASSESSMENT:   68 year old male who presented on 11/11 as a transfer from [name]. Pt admitted with MRSA bacteremia. PMH of T2DM, HTN, CKD stage IV, anemia, right BKA. CT abdomen showing discitis involving T10, T11 area.  11/16 - s/p tunneled CVC placement  Reviewed I/O's: -2.2 L x 24 hours and -16.6 L since 09/20/19  UOP: 2.4 L x 24 hours  Pt sleeping at time of visit with covers over his head. He did not respond to name being called.   Pt with good appetite. Noted meal completion 50-100%. Pt refusing Ensure Max supplements, but taking Juven per MAR.  Per RNCM notes, possible discharge to home next week.   Labs reviewed: CBGS: 113-172 (inpatient orders for glycemic control are 0-9 units insulin aspart TID with meals).   Diet Order:   Diet Order            Diet - low sodium heart healthy        Diet heart healthy/carb modified Room service appropriate? Yes with Assist; Fluid consistency: Thin  Diet effective now              EDUCATION NEEDS:   No education needs have been identified at this time  Skin:  Skin  Assessment: Skin Integrity Issues: Skin Integrity Issues:: Unstageable Unstageable: left heel, left foot  Last BM:  10/03/19  Height:   Ht Readings from Last 1 Encounters:  09/05/19 6\' 6"  (1.981 m)    Weight:   Wt Readings from Last 1 Encounters:  10/04/19 108.8 kg    Ideal Body Weight:  91 kg(adjusted for right BKA)  BMI:  Body mass index is 27.72 kg/m.  Estimated Nutritional Needs:   Kcal:  2200-2400  Protein:  110-125 grams  Fluid:  >/= 2.0 L    Temitope Griffing A. Jimmye Norman, RD, LDN, [place] Registered Dietitian II Certified Diabetes Care and Education Specialist Pager: [phone] After hours Pager: [phone]

## 2019-10-04 NOTE — Progress Notes (Signed)
Patient ID: Thomas [name], male   DOB: 11-29-1950, 68 y.o.   MRN: [id number]  PROGRESS NOTE    ADEYEMI GOAD  [id number] DOB: [birth year] DOA: 08/31/2019 PCP: Antonietta Jewel, MD   Brief Narrative:  68 year old male with history of chronic back pain, multiple back surgeries, narcotic dependence, hypertension, chronic kidney disease stage III, anemia was initially admitted to [facility] for fever, chills and back pain.  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  He was transferred to [facility] for the same.  2D echo at outside hospital showed no vegetation.  ID recommended daptomycin while inpatient and dalbavancin on discharge if discharging home, declined by SNF due to cost of daptomycin and home alone not felt to be safe at this time due to extremely limited mobility. 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   -ID recommendations: Daptomycin while inpatient till 10/26/2019; recommendation for dalbavancin on discharge if discharging home, this was not felt to be an option until now due to extremely limited mobility, no family support. -SNF declined the patient because of patient requiring daptomycin. -Previous MD discussed with ID regarding possibility of dalbavancin for SNF, unfortunately this is not felt to be an option -Remains hemodynamically stable and afebrile, now finally mobility is starting to improve, -12/14 he was able to get out of bed and take a few steps in his room, PT still  recommends  SNF however patient feels like he may be able to go home later this week if mobility continues to improve, case management following and attempting to arrange home health services for possible discharge home later this week  Chronic pain syndrome -Patient follows up with the pain clinic.  He is on OxyContin and Neurontin as an outpatient. -Started on hydromorphone inpatient -Discontinued gabapentin, per staff pt declining this the whole time -Contin dose has been escalated through this hospitalization, now on 40 mg twice daily -Restarted Zanaflex, dose increased, having significant improvement in symptoms on this regimen -Patient encouraged to continue to work with PT  Constipation  -Treated for constipation during this hospitalization, given enemas -Continue Dulcolax suppository daily, also continue MiraLAX, Senokot, lactulose -Last BM 12/14  Acute kidney injury on chronic kidney disease stage IV -Baseline creatinine around 2.7, improved with hydration - renal ultrasound was negative for hydronephrosis.  -Creatinine had been stable around 2.6-2.7, and then bumped up to 2.9 and 3.1, no offending meds noted  -We did a bladder scan on 12/13 and he was noted to have 900 cc of urine, Foley catheter placed , good urine output and kidney function is stable/slightly better -Since mobility is improving, attempt to remove catheter today -Monitor urine output, bladder scan as needed  Chronic normocytic anemia -Hemoglobin stable in the 7.5-8.5 range, anemia panel with chronic disease -Stable, monitor  Diabetes mellitus type 2 -On glipizide as an outpatient -Continue CBGs with SSI  Hypomagnesemia -Replaced  Hypothyroidism -Continue Synthroid  Essential hypertension -Continue amlodipine, clonidine, hydralazine, metoprolol  Left heel/foot unstageable pressure ulcer: Present on admission -Continue wound care  DVT prophylaxis: Heparin Code Status: Full  Family Communication: None at  bedside Disposition Plan: Declined by SNF, now considering home health again later this week if able to move better  Consultants: ID  Procedures: None  Antimicrobials:  Anti-infectives (From admission, onward)   Start     Dose/Rate Route Frequency Ordered Stop   09/25/19 2000  DAPTOmycin (CUBICIN) 1,000 mg in sodium chloride 0.9 % IVPB     1,000 mg 240 mL/hr over 30 Minutes Intravenous Daily 09/25/19 0948     09/23/19 2000  DAPTOmycin (CUBICIN) 1,000 mg in sodium chloride 0.9 % IVPB  Status:  Discontinued     1,000 mg 240 mL/hr over 30 Minutes Intravenous Every 48 hours 09/22/19 1032 09/25/19 0948   09/20/19 0000  daptomycin (CUBICIN) IVPB     1,000 mg Intravenous Every 24 hours 09/20/19 0936 10/26/19 2359   09/14/19 0000  dalbavancin (DALVANCE) 500 MG SOLR  Status:  Discontinued        09/14/19 1306 09/20/19    09/09/19 0000  daptomycin (CUBICIN) IVPB  Status:  Discontinued     1,000 mg Intravenous Every 24 hours 09/09/19 0833 09/14/19    09/08/19 0000  daptomycin (CUBICIN) IVPB  Status:  Discontinued     1,000 mg Intravenous Every 24 hours 09/08/19 0922 09/09/19    09/02/19 2000  DAPTOmycin (CUBICIN) 1,000 mg in sodium chloride 0.9 % IVPB  Status:  Discontinued     1,000 mg 240 mL/hr over 30 Minutes Intravenous Every 48 hours 08/31/19 1205 09/02/19 1206   09/02/19 2000  DAPTOmycin (CUBICIN) 1,000 mg in sodium chloride 0.9 % IVPB  Status:  Discontinued     1,000 mg 240 mL/hr over 30 Minutes Intravenous Every 24 hours 09/02/19 1206 09/22/19 1032   08/31/19 1000  DAPTOmycin (CUBICIN) 750 mg in sodium chloride 0.9 % IVPB  Status:  Discontinued     750 mg 230 mL/hr over 30 Minutes Intravenous Every 48 hours 08/31/19 0414 08/31/19 1205       Subjective: -No events overnight, patient excited that he was able to take a few steps in his room yesterday, awaiting therapy reassessment today -Had a bowel movement yesterday, agrees to remove Foley catheter and attempt to stand up to  void    Objective: Vitals:   10/03/19 1640 10/03/19 2148 10/04/19 0444 10/04/19 0500  BP: 127/81 (!) 154/76 (!) 141/77   Pulse: 64 62 (!) 56   Resp:  14 17   Temp: 98.6 F (37 C) 98.1 F (36.7 C) 98.1 F (36.7 C)   TempSrc: Oral Oral Oral   SpO2: 98% 98% 98%   Weight:    108.8 kg  Height:        Intake/Output Summary (Last 24 hours) at 10/04/2019 1130 Last data filed at 10/04/2019 0825 Gross per 24 hour  Intake 120 ml  Output 2650 ml  Net -2530 ml   Filed Weights   09/29/19 0900 09/30/19 1433 10/04/19 0500  Weight: 111.7 kg 110.4 kg 108.8 kg    Examination:  Gen: Obese elderly chronically ill gentleman, appears older than stated age, AAOx3, no distress HEENT: PERRLA, Neck supple, no JVD Lungs: Clear  CVS: RRR,No Gallops,Rubs or new Murmurs Abd: soft, Non tender, non distended, BS present Extremities: Right BKA, left heel with dressing Skin: no new rashes  Data Reviewed: I have personally reviewed following labs and imaging studies  CBC: Recent Labs  Lab 09/28/19 0330 09/30/19 0517 10/01/19 0351 10/02/19 0340 10/03/19 0316 10/04/19 0442  WBC 4.8 5.4 5.8 6.7 6.0 5.8  NEUTROABS 2.5  --   --   --   --   --   HGB 7.8* 7.7* 8.3* 7.8* 8.2* 7.9*  HCT 23.8* 23.8* 25.3* 24.3* 25.0* 25.0*  MCV 93.3 94.8 94.8 95.7 96.2 96.9  PLT 170 171 190 182 199 [id number]   Basic Metabolic Panel: Recent Labs  Lab 09/28/19 0330 09/30/19 0517 10/01/19 0351 10/02/19 0340 10/03/19 0316 10/04/19 0442  NA 136 134* 135 134* 133* 136  K 4.4 5.0 4.9 5.1 5.0 4.6  CL 103 99 99 100 100 103  CO2 24 25 24 24 22 24   GLUCOSE 135* 187* 144* 165* 179* 154*  BUN 27* 35* 39* 37* 37* 37*  CREATININE 2.60* 2.97* 3.13* 3.01* 2.89* 3.04*  CALCIUM 8.7* 8.9 8.8* 8.6* 8.5* 8.7*  MG 1.8  --   --   --   --   --    GFR: Estimated Creatinine Clearance: 30.9 mL/min (A) (by C-G formula based on SCr of 3.04 mg/dL (H)). Liver Function Tests: No results for input(s): AST, ALT, ALKPHOS, BILITOT, PROT,  ALBUMIN in the last 168 hours. No results for input(s): LIPASE, AMYLASE in the last 168 hours. No results for input(s): AMMONIA in the last 168 hours. Coagulation Profile: No results for input(s): INR, PROTIME in the last 168 hours. Cardiac Enzymes: Recent Labs  Lab 09/28/19 0330 09/29/19 1029  CKTOTAL 96 92   BNP (last 3 results) No results for input(s): PROBNP in the last 8760 hours. HbA1C: No results for input(s): HGBA1C in the last 72 hours. CBG: Recent Labs  Lab 10/03/19 0835 10/03/19 1232 10/03/19 1644 10/03/19 2144 10/04/19 0743  GLUCAP 169* 148* 171* 172* 151*   Lipid Profile: No results for input(s): CHOL, HDL, LDLCALC, TRIG, CHOLHDL, LDLDIRECT in the last 72 hours. Thyroid Function Tests: No results for input(s): TSH, T4TOTAL, FREET4, T3FREE, THYROIDAB in the last 72 hours. Anemia Panel: No results for input(s): VITAMINB12, FOLATE, FERRITIN, TIBC, IRON, RETICCTPCT in the last 72 hours. Sepsis Labs: No results for input(s): PROCALCITON, LATICACIDVEN in the last 168 hours.  No results found for this or any previous visit (from the past 240 hour(s)).       Radiology Studies: No results found.      Scheduled Meds: . allopurinol  100 mg Oral BID  . amLODipine  10 mg Oral Daily  . bisacodyl  10 mg Rectal Daily  . Chlorhexidine Gluconate Cloth  6 each Topical Daily  . cloNIDine  0.2 mg Oral BID  . ferrous sulfate  325 mg Oral BID WC  . finasteride  5 mg Oral Daily  . heparin  5,000 Units Subcutaneous Q8H  . hydrALAZINE  50 mg Oral Q8H  . insulin aspart  0-9 Units Subcutaneous TID WC  . levothyroxine  50 mcg Oral Q0600  . metoprolol tartrate  50 mg Oral BID  . multivitamin with minerals  1 tablet Oral Daily  . nutrition supplement (JUVEN)  1 packet Oral BID BM  . oxyCODONE  40 mg Oral Q12H  . polyethylene glycol  17 g Oral TID  . Ensure Max Protein  11 oz Oral QHS  . senna-docusate  1 tablet Oral BID  . sorbitol  15 mL Oral Daily  . tamsulosin   0.4 mg Oral Daily  . vitamin B-12  2,500 mcg Oral Daily   Continuous Infusions: . sodium chloride Stopped (09/28/19 2026)  . DAPTOmycin (CUBICIN)  IV Stopped (10/03/19 2139)    Domenic Polite, MD [facility] 10/04/2019, 11:30 AM

## 2019-10-04 NOTE — Plan of Care (Signed)
  Problem: Coping: Goal: Level of anxiety will decrease Outcome: Progressing   Problem: Pain Managment: Goal: General experience of comfort will improve Outcome: Progressing   Problem: Safety: Goal: Ability to remain free from injury will improve Outcome: Progressing   Problem: Skin Integrity: Goal: Risk for impaired skin integrity will decrease Outcome: Progressing   

## 2019-10-04 NOTE — Progress Notes (Signed)
MD called back, informed of patient inability to void.Informed MD of bladder scan of 700 ml. Orders received to have patient attempt to void, attempt to have patient walk then attempt. If patient unable to void after multiple attempts may place I&O cath. Will encourage patient to attempt to void and continue to monitor closely for remainder of shift.

## 2019-10-04 NOTE — Progress Notes (Signed)
Patient unable to void after multiple attempts. Educated patient on rationale for In&Out catheterization. Patient verbalized understanding. Performed peri care and utilized swabs in catheter tray. Placed 63F in and out catheter. Obtained 800 ml of clear yellow urine. Removed catheter and performed peri care. Patient tolerated procedure well. Will continue to monitor closely for remainder of shift.

## 2019-10-04 NOTE — TOC Progression Note (Signed)
Transition of Care [facility]) - Progression Note    Patient Details  Name: Thomas [name] MRN: [id number] Date of Birth: [birth year]  Transition of Care [facility]) CM/SW Contact  Jacalyn Lefevre Edson Snowball, RN Phone Number: 10/04/2019, 1:43 PM  Clinical Narrative:     Se previous note. Followed up with patient regarding discharge plan. Patient feels like he will be ready to discharge Thursday. His friend has his keys. Patient concerned about his Ox contin  Dose at home. He will call his PCP this afternoon regarding same. Patient states his mobility is improving.  [name] with [facility] and Cory  With [facility] aware   Expected Discharge Plan: [place] Barriers to Discharge: Barriers Unresolved (comment)(no support at home , IV Cubin until 10/26/19,)  Expected Discharge Plan and Services Expected Discharge Plan: [place] Choice: [place] arrangements for the past 2 months: Single Family Home Expected Discharge Date: 09/08/19                                     Social Determinants of Health ([place]) Interventions    Readmission Risk Interventions Readmission Risk Prevention Plan 09/17/2019  Transportation Screening Complete  PCP or Specialist Appt within 5-7 Days Not Complete  Not Complete comments snf vs home  Home Care Screening Complete  Medication Review (RN CM) Complete  Some recent data might be hidden

## 2019-10-04 NOTE — Progress Notes (Signed)
Patient bladder scanned for 700 ml. MD paged, awaiting call back. Will continue to monitor.

## 2019-10-05 LAB — GLUCOSE, CAPILLARY
Glucose-Capillary: 149 mg/dL — ABNORMAL HIGH (ref 70–99)
Glucose-Capillary: 145 mg/dL — ABNORMAL HIGH (ref 70–99)
Glucose-Capillary: 119 mg/dL — ABNORMAL HIGH (ref 70–99)
Glucose-Capillary: 151 mg/dL — ABNORMAL HIGH (ref 70–99)

## 2019-10-05 LAB — CBC
HCT: 23.7 % — ABNORMAL LOW (ref 39.0–52.0)
Hemoglobin: 7.8 g/dL — ABNORMAL LOW (ref 13.0–17.0)
MCH: 31.3 pg (ref 26.0–34.0)
MCHC: 32.9 g/dL (ref 30.0–36.0)
MCV: 95.2 fL (ref 80.0–100.0)
Platelets: 191 10*3/uL (ref 150–400)
RBC: 2.49 MIL/uL — ABNORMAL LOW (ref 4.22–5.81)
RDW: 16.2 % — ABNORMAL HIGH (ref 11.5–15.5)
WBC: 6 10*3/uL (ref 4.0–10.5)
nRBC: 0 % (ref 0.0–0.2)

## 2019-10-05 LAB — BASIC METABOLIC PANEL
Anion gap: 10 (ref 5–15)
BUN: 40 mg/dL — ABNORMAL HIGH (ref 8–23)
CO2: 23 mmol/L (ref 22–32)
Calcium: 8.8 mg/dL — ABNORMAL LOW (ref 8.9–10.3)
Chloride: 102 mmol/L (ref 98–111)
Creatinine, Ser: 2.96 mg/dL — ABNORMAL HIGH (ref 0.61–1.24)
GFR calc Af Amer: 24 mL/min — ABNORMAL LOW (ref >60)
GFR calc non Af Amer: 21 mL/min — ABNORMAL LOW (ref >60)
Glucose, Bld: 204 mg/dL — ABNORMAL HIGH (ref 70–99)
Potassium: 4.8 mmol/L (ref 3.5–5.1)
Sodium: 135 mmol/L (ref 135–145)

## 2019-10-05 MED ORDER — DALVANCE 500 MG IV SOLR: INTRAVENOUS | 0 refills | Status: DC

## 2019-10-05 NOTE — Progress Notes (Signed)
PROGRESS NOTE    Thomas [name]  [id number] DOB: 25-Nov-1950 DOA: 08/31/2019 PCP: Antonietta Jewel, MD  Brief Narrative:68 year old male with history of chronic back pain, multiple back surgeries, narcotic dependence, hypertension, chronic kidney disease stage III, anemia was initially admitted to [facility] for fever, chills and back pain.  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  He was transferred to [facility] for the same.  2D echo at outside hospital showed no vegetation.  ID recommended daptomycin while inpatient and dalbavancin on discharge if discharging home, declined by SNF due to cost of daptomycin and home alone not felt to be safe at this time due to extremely limited mobility. 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  Transthoracic echo no vegetation from outside hospital.  CT scan showed discitis osteomyelitis at T10 and 11 with limited assessment for epidural abscess.  ID recommended daptomycin while inpatient till 10/26/2019.  They also recommended dalbavancin on discharge if patient is going home.  However patient lives alone and has very limited mobility and has no family and has right BKA. SNF declined due to high cost of daptomycin. He reports he got out of bed yesterday once.  However PT still recommends SNF. Patient feels he will be able to go home later this week once he is able to move better.  #2 urinary retention patient had urinary retention since last night ironed out 3 times clear urine more than 700 cc of urine this morning.  We will check a UA.  Continue Flomax and Proscar.  Patient has benign prostatic hypertrophy.  #3 constipation new MiraLAX Senokot and lactulose.  Need to make sure he does  not remain constipated with urinary retention which can get worse.  Bladder catheter was taken out 10/04/2019.  #4 AKI on CKD stage IV improved with hydration.  Renal ultrasound showed no acute hydronephrosis.  #5 chronic pain syndrome.  Patient is on OxyContin and Neurontin and Zanaflex as an outpatient.  Zanaflex restarted.  #6 type 2 diabetes continue SSI restart glipizide on discharge at a lower dose since blood sugar is stable.  #7 hypothyroidism continue Synthroid  #8 hypertension essential continue metoprolol hydralazine clonidine and Norvasc.  Blood pressure 154/82.  #9 left foot unstageable pressure ulcer present on admission continue wound care.  Pressure Injury 09/02/19 Heel Left Unstageable - Full thickness tissue loss in which the base of the ulcer is covered by slough (yellow, tan, gray, green or brown) and/or eschar (tan, brown or black) in the wound bed. (Active)  09/02/19 0815  Location: Heel  Location Orientation: Left  Staging: Unstageable - Full thickness tissue loss in which the base of the ulcer is covered by slough (yellow, tan, gray, green or brown) and/or eschar (tan, brown or black) in the wound bed.  Wound Description (Comments):   Present on Admission: Yes     Pressure Injury 09/02/19 Foot Left Unstageable - Full thickness tissue loss in which the base of the ulcer is covered by slough (yellow, tan, gray, green or brown) and/or eschar (tan, brown or black) in the wound bed. (Active)  09/02/19 0815  Location: Foot  Location Orientation: Left  Staging: Unstageable - Full thickness tissue loss in which  the base of the ulcer is covered by slough (yellow, tan, gray, green or brown) and/or eschar (tan, brown or black) in the wound bed.  Wound Description (Comments):   Present on Admission: Yes      Nutrition Problem: Increased nutrient needs Etiology: acute illness(MRSA bacteremia)     Signs/Symptoms: estimated needs    Interventions: Glucerna shake,  Prostat  Estimated body mass index is 27.64 kg/m as calculated from the following:   Height as of this encounter: 6\' 6"  (1.981 m).   Weight as of this encounter: 108.5 kg.  DVT prophylaxis: Heparin  code Status: Full code  family Communication: None at bedside  disposition Plan: Pending clinical improvement and further progress so patient can go home declined by SNF Consultants:   Infectious disease  Procedures: None Antimicrobials: Subjective:  Patient resting in bed was excited to take his few steps with help of therapist last evening.  Continues to have urinary retention.  No burning urination. Objective: Vitals:   10/04/19 1354 10/04/19 2203 10/05/19 0448 10/05/19 0451  BP: 135/82 119/68  (!) 154/82  Pulse: 81 (!) 57  63  Resp: 18 18  19   Temp: 97.7 F (36.5 C) 98.1 F (36.7 C)  97.9 F (36.6 C)  TempSrc: Oral Oral  Oral  SpO2: 98% 98%  100%  Weight:   108.5 kg   Height:        Intake/Output Summary (Last 24 hours) at 10/05/2019 1450 Last data filed at 10/05/2019 1239 Gross per 24 hour  Intake 263 ml  Output 2350 ml  Net -2087 ml   Filed Weights   09/30/19 1433 10/04/19 0500 10/05/19 0448  Weight: 110.4 kg 108.8 kg 108.5 kg    Examination:  General exam: Appears calm and comfortable  Respiratory system: Clear to auscultation. Respiratory effort normal. Cardiovascular system: S1 & S2 heard, RRR. No JVD, murmurs, rubs, gallops or clicks. No pedal edema. Gastrointestinal system: Abdomen is nondistended, soft and nontender. No organomegaly or masses felt. Normal bowel sounds heard. Central nervous system: Alert and oriented. No focal neurological deficits. Extremities: Right BKA left heel covered with dressing chronic venous stasis changes. Skin: No rashes, lesions or ulcers Psychiatry: Judgement and insight appear normal. Mood & affect appropriate.     Data Reviewed: I have personally reviewed following labs and imaging studies  CBC: Recent Labs  Lab  10/01/19 0351 10/02/19 0340 10/03/19 0316 10/04/19 0442 10/05/19 0236  WBC 5.8 6.7 6.0 5.8 6.0  HGB 8.3* 7.8* 8.2* 7.9* 7.8*  HCT 25.3* 24.3* 25.0* 25.0* 23.7*  MCV 94.8 95.7 96.2 96.9 95.2  PLT 190 182 199 200 [id number]   Basic Metabolic Panel: Recent Labs  Lab 10/01/19 0351 10/02/19 0340 10/03/19 0316 10/04/19 0442 10/05/19 0236  NA 135 134* 133* 136 135  K 4.9 5.1 5.0 4.6 4.8  CL 99 100 100 103 102  CO2 24 24 22 24 23   GLUCOSE 144* 165* 179* 154* 204*  BUN 39* 37* 37* 37* 40*  CREATININE 3.13* 3.01* 2.89* 3.04* 2.96*  CALCIUM 8.8* 8.6* 8.5* 8.7* 8.8*   GFR: Estimated Creatinine Clearance: 31.7 mL/min (A) (by C-G formula based on SCr of 2.96 mg/dL (H)). Liver Function Tests: No results for input(s): AST, ALT, ALKPHOS, BILITOT, PROT, ALBUMIN in the last 168 hours. No results for input(s): LIPASE, AMYLASE in the last 168 hours. No results for input(s): AMMONIA in the last 168 hours. Coagulation Profile: No results for input(s): INR, PROTIME in the last 168 hours. Cardiac Enzymes: Recent  Labs  Lab 09/29/19 1029  CKTOTAL 92   BNP (last 3 results) No results for input(s): PROBNP in the last 8760 hours. HbA1C: No results for input(s): HGBA1C in the last 72 hours. CBG: Recent Labs  Lab 10/04/19 1154 10/04/19 1732 10/04/19 2201 10/05/19 0745 10/05/19 1235  GLUCAP 113* 163* 123* 149* 145*   Lipid Profile: No results for input(s): CHOL, HDL, LDLCALC, TRIG, CHOLHDL, LDLDIRECT in the last 72 hours. Thyroid Function Tests: No results for input(s): TSH, T4TOTAL, FREET4, T3FREE, THYROIDAB in the last 72 hours. Anemia Panel: No results for input(s): VITAMINB12, FOLATE, FERRITIN, TIBC, IRON, RETICCTPCT in the last 72 hours. Sepsis Labs: No results for input(s): PROCALCITON, LATICACIDVEN in the last 168 hours.  No results found for this or any previous visit (from the past 240 hour(s)).       Radiology Studies: No results found.      Scheduled Meds: .  allopurinol  100 mg Oral BID  . amLODipine  10 mg Oral Daily  . bisacodyl  10 mg Rectal Daily  . Chlorhexidine Gluconate Cloth  6 each Topical Daily  . cloNIDine  0.2 mg Oral BID  . ferrous sulfate  325 mg Oral BID WC  . finasteride  5 mg Oral Daily  . heparin  5,000 Units Subcutaneous Q8H  . hydrALAZINE  50 mg Oral Q8H  . insulin aspart  0-9 Units Subcutaneous TID WC  . levothyroxine  50 mcg Oral Q0600  . metoprolol tartrate  50 mg Oral BID  . multivitamin with minerals  1 tablet Oral Daily  . nutrition supplement (JUVEN)  1 packet Oral BID BM  . oxyCODONE  40 mg Oral Q12H  . polyethylene glycol  17 g Oral TID  . senna-docusate  1 tablet Oral BID  . sorbitol  15 mL Oral Daily  . tamsulosin  0.4 mg Oral Daily  . vitamin B-12  2,500 mcg Oral Daily   Continuous Infusions: . sodium chloride Stopped (09/28/19 2026)  . DAPTOmycin (CUBICIN)  IV Stopped (10/05/19 0728)     LOS: 35 days      Georgette Shell, MD [facility]  If 7PM-7AM, please contact night-coverage [email or web address] Password [email or web address] 10/05/2019, 2:50 PM

## 2019-10-05 NOTE — Progress Notes (Signed)
MD ordered in and out cath, @1230  obtained 700 mL of clear yellow urine output.  Will continue to monitor.

## 2019-10-05 NOTE — Progress Notes (Signed)
Patient still having difficulty voiding. Pt reported voiding "a little bit" while having bowel movement. Bladder scan done and showed 143 mL of urine at 1835. MD paged and aware. No further orders. Will endorse accordingly.

## 2019-10-05 NOTE — Progress Notes (Signed)
Physical Therapy Treatment Patient Details Name: Thomas [name] MRN: [id number] DOB: 12-Dec-1950 Today's Date: 10/05/2019    History of Present Illness Patient is a 68 year old male history of diabetes mellitus type 2, hypertension, chronic kidney disease stage III, anemia, R BKA Sept 2019, who was admitted at [facility] after having fever chills and back pain. Patient transferred from [facility] for MRSA bacteremia. CT abdomen showing discitis involvling T10, T11 area.    PT Comments    Pt in bed upon arrival of PT/OT, agreeable to session with focus on progressing mobility. The pt demos improved stability with don and doff of prosthetic, as well as improved safety with mobility in room. The pt reports he cannot wear his mask over his nose or else he will not be able to breathe, so all mobility was done in room on this date. The pt was able to demonstrate multiple transfers without use of RW from sitting EOB, and generally improved ambulation in room (60 ft with RW). The pt will continue to benefit from skilled PT to progress mobility as well as strength and safety.    Follow Up Recommendations  Home health PT;Supervision for mobility/OOB     Equipment Recommendations  Rolling walker with 5" wheels;3in1 (PT)    Recommendations for Other Services       Precautions / Restrictions Precautions Precautions: Fall Precaution Comments: contact precautions MRSA, R BKA, prosthetic in room Restrictions Weight Bearing Restrictions: No    Mobility  Bed Mobility Overal bed mobility: Needs Assistance Bed Mobility: Supine to Sit;Sit to Supine Rolling: Modified independent (Device/Increase time) Sidelying to sit: Modified independent (Device/Increase time) Supine to sit: Supervision;HOB elevated Sit to supine: Supervision;HOB elevated Sit to sidelying: Modified independent (Device/Increase time)    Transfers Overall transfer level: Needs assistance Equipment used: Rolling  walker (2 wheeled) Transfers: Sit to/from Stand Sit to Stand: Min guard         General transfer comment: Pt moved quickly though session able to demo 5XSTS in 58 sec with no use of AD and min guard. elevated surface due to height  Ambulation/Gait Ambulation/Gait assistance: Min guard Gait Distance (Feet): 60 Feet(within room) Assistive device: Rolling walker (2 wheeled) Gait Pattern/deviations: Step-through pattern;Decreased stride length Gait velocity: impulsively quick   General Gait Details: Pt ambulated in room with min guard and use of RW, pt ambulates quickly, impulsively, and demos poor control of RW in tight spaces (often lifting RW to turn and using RW to hit objects in his way to move them). the pt also sat on the counter multiple times through the session to "take a breakInvestment banker, corporate [place] (Stroke Patients Only)       Balance Overall balance assessment: Needs assistance Sitting-balance support: Feet supported Sitting balance-[name]: Good Sitting balance - Comments: No episodes of throwing self back on bed today; but does apply significant weight bearing in arms to relief pressure on spine   Standing balance support: Bilateral upper extremity supported;During functional activity Standing balance-[name]: Fair Standing balance comment: reliant on B UE to ambulate, patient demonstrated ability to sit <> stand without use of AD at EOB                            Cognition Arousal/Alertness: Awake/alert Behavior During Therapy: [facility] for tasks assessed/performed;Impulsive Overall Cognitive Status: Within Functional  Limits for tasks assessed Area of Impairment: Attention;Following commands;Awareness;Problem solving;Safety/judgement                   Current Attention Level: Selective   Following Commands: Follows one step commands with increased time;Follows one step commands  inconsistently Safety/Judgement: Decreased awareness of safety Awareness: Anticipatory Problem Solving: Requires verbal cues;Difficulty sequencing General Comments: Pt did not mention pain today, but continued to demo poor safety decisions through session but reports that "once he goes home he will be fine" when prompted about mobility and ADs he could use at home. Pt was able to don and problem-solve donning of prosthetic today      Exercises      General Comments        Pertinent Vitals/Pain Pain Assessment: Faces Faces Pain Scale: Hurts even more Pain Location: back Pain Descriptors / Indicators: Grimacing;Guarding Pain Intervention(s): Limited activity within patient's tolerance;Monitored during session;Repositioned    Home Living                      Prior Function            PT Goals (current goals can now be found in the care plan section) Acute Rehab PT Goals Patient Stated Goal: to return home PT Goal Formulation: With patient Time For Goal Achievement: 10/17/19 Potential to Achieve Goals: Fair Progress towards PT goals: Progressing toward goals    Frequency    Min 2X/week      PT Plan Discharge plan needs to be updated    Co-evaluation PT/OT/SLP Co-Evaluation/Treatment: Yes Reason for Co-Treatment: For patient/therapist safety;To address functional/ADL transfers;Necessary to address cognition/behavior during functional activity PT goals addressed during session: Mobility/safety with mobility;Balance;Proper use of DME OT goals addressed during session: ADL's and self-care      AM-PAC PT "6 Clicks" Mobility   Outcome Measure  Help needed turning from your back to your side while in a flat bed without using bedrails?: None Help needed moving from lying on your back to sitting on the side of a flat bed without using bedrails?: None Help needed moving to and from a bed to a chair (including a wheelchair)?: A Little Help needed standing up from  a chair using your arms (e.g., wheelchair or bedside chair)?: A Little Help needed to walk in hospital room?: A Little Help needed climbing 3-5 steps with a railing? : A Lot 6 Click Score: 19    End of Session Equipment Utilized During Treatment: Gait belt Activity Tolerance: Patient limited by pain Patient left: in bed;with call bell/phone within reach;with bed alarm set Nurse Communication: Mobility status PT Visit Diagnosis: Muscle weakness (generalized) (M62.81);Other abnormalities of gait and mobility (R26.89);Pain Pain - Right/Left: Left Pain - part of body: (back)     Time: [id number] PT Time Calculation (min) (ACUTE ONLY): 35 min  Charges:  $Gait Training: 8-22 mins                     Karma Ganja, PT, DPT   Acute Rehabilitation Department [phone]   Otho Bellows 10/05/2019, 4:14 PM

## 2019-10-05 NOTE — Progress Notes (Signed)
Occupational Therapy Treatment Patient Details Name: Thomas [name] MRN: [id number] DOB: 02/05/1951 Today's Date: 10/05/2019    History of present illness Patient is a 68 year old male history of diabetes mellitus type 2, hypertension, chronic kidney disease stage III, anemia, R BKA Sept 2019, who was admitted at [facility] after having fever chills and back pain. Patient transferred from [facility] for MRSA bacteremia. CT abdomen showing discitis involvling T10, T11 area.   OT comments  Patient semi-supine in bed upon arrival, agreeable to therapy. Patient supervision level for bed mobility, set up/supervision for LB dressing at edge of bed. Patient demonstrates impulsivity with functional transfers and ambulation, requiring cues to slow down due to IV/navigating in patient's room. Min guard for safety due to impulsivity, this may be close to patient's baseline as patient states he takes long strides and turns quickly at home as well. Will continue to follow with acute OT services.   Follow Up Recommendations  Home health OT;Supervision/Assistance - 24 hour;Other (comment)(pt refusing SNF, improving mobility with therapy)    Equipment Recommendations  Other (comment)(to be determined)       Precautions / Restrictions Precautions Precautions: Fall Precaution Comments: contact precautions MRSA, R BKA, prosthetic in room Restrictions Weight Bearing Restrictions: No       Mobility Bed Mobility Overal bed mobility: Needs Assistance Bed Mobility: Supine to Sit;Sit to Supine     Supine to sit: Supervision;HOB elevated Sit to supine: Supervision;HOB elevated      Transfers Overall transfer level: Needs assistance Equipment used: Rolling walker (2 wheeled) Transfers: Sit to/from Stand Sit to Stand: Min guard              Balance Overall balance assessment: Needs assistance Sitting-balance support: Feet supported Sitting balance-[name]: Good      Standing balance support: Bilateral upper extremity supported;During functional activity Standing balance-[name]: Fair Standing balance comment: reliant on B UE to ambulate, patient demonstrated ability to sit <> stand without use of AD at EOB                           ADL either performed or assessed with clinical judgement   ADL Overall ADL's : Needs assistance/impaired                     Lower Body Dressing: Supervision/safety;Sitting/lateral leans;Set up Lower Body Dressing Details (indicate cue type and reason): pt able to don prothesis and doff L sock; supervision for safety Toilet Transfer: Min guard;Ambulation;RW Toilet Transfer Details (indicate cue type and reason): simulated with sit to stand transfer, patient is able to power up with min guard assist for safety and verbal cues for body mechanics.          Functional mobility during ADLs: Min guard;Minimal assistance;Rolling walker;Cueing for safety General ADL Comments: patient min guard for OOB activity due to impulsivity requiring verbal cues for safety               Cognition Arousal/Alertness: Awake/alert Behavior During Therapy: [facility] for tasks assessed/performed;Impulsive Overall Cognitive Status: Within Functional Limits for tasks assessed                                                     Pertinent Vitals/ Pain       Pain  Assessment: Faces Faces Pain Scale: Hurts even more Pain Location: back Pain Descriptors / Indicators: Grimacing;Guarding Pain Intervention(s): Limited activity within patient's tolerance;Monitored during session         Frequency  Min 2X/week        Progress Toward Goals  OT Goals(current goals can now be found in the care plan section)  Progress towards OT goals: Progressing toward goals  Acute Rehab OT Goals Patient Stated Goal: to return home OT Goal Formulation: With patient Time For Goal Achievement: 10/19/19 Potential  to Achieve Goals: Good ADL Goals Pt Will Perform Lower Body Bathing: with modified independence Pt Will Perform Lower Body Dressing: with modified independence Pt Will Transfer to Toilet: with modified independence;ambulating Pt Will Perform Toileting - Clothing Manipulation and hygiene: with modified independence;sit to/from stand  Plan Discharge plan updated;Other (comment)(pt refusing SNF)    Co-evaluation    PT/OT/SLP Co-Evaluation/Treatment: Yes Reason for Co-Treatment: To address functional/ADL transfers;For patient/therapist safety   OT goals addressed during session: ADL's and self-care      AM-PAC OT "6 Clicks" Daily Activity     Outcome Measure   Help from another person eating meals?: None Help from another person taking care of personal grooming?: A Little Help from another person toileting, which includes using toliet, bedpan, or urinal?: A Little Help from another person bathing (including washing, rinsing, drying)?: A Little Help from another person to put on and taking off regular upper body clothing?: A Little Help from another person to put on and taking off regular lower body clothing?: A Little 6 Click Score: 19    End of Session Equipment Utilized During Treatment: Rolling walker;Gait belt  OT Visit Diagnosis: Pain Pain - part of body: (back)   Activity Tolerance Patient tolerated treatment well   Patient Left in bed;with call bell/phone within reach;with bed alarm set   Nurse Communication Mobility status        Time: [id number] OT Time Calculation (min): 35 min  Charges: OT General Charges $OT Visit: 1 Visit OT Treatments $Self Care/Home Management : 8-22 mins  Shon Millet OT OT office: [place] 10/05/2019, 3:22 PM

## 2019-10-05 NOTE — Progress Notes (Addendum)
Pt only voided 175ml at 2am.  Encouraged to void some more but pt unable to void after multiple attempts. Bladder scan showed 656ml. Text page oncall K. Eubanks,NP and received order to do In and out cath. @0545  obtained approx 600 ml clear yellow urine output from In & out cath.

## 2019-10-05 NOTE — Progress Notes (Signed)
Pt unable to void and complains of pelvic pain. PRN meds given. Bladder scan done and showed 608 ml of urine. Paged MD. Will continue to monitor

## 2019-10-05 NOTE — Care Management Important Message (Signed)
Important Message  Patient Details  Name: Thomas [name] MRN: [id number] Date of Birth: [birth year]   Medicare Important Message Given:  Yes     Memory Argue 10/05/2019, 2:02 PM

## 2019-10-06 LAB — GLUCOSE, CAPILLARY
Glucose-Capillary: 132 mg/dL — ABNORMAL HIGH (ref 70–99)
Glucose-Capillary: 150 mg/dL — ABNORMAL HIGH (ref 70–99)
Glucose-Capillary: 199 mg/dL — ABNORMAL HIGH (ref 70–99)
Glucose-Capillary: 169 mg/dL — ABNORMAL HIGH (ref 70–99)

## 2019-10-06 LAB — URINE CULTURE: Culture: <10000 — AB

## 2019-10-06 LAB — CK: Total CK: 226 U/L (ref 49–397)

## 2019-10-06 NOTE — Progress Notes (Signed)
PROGRESS NOTE    Thomas [name]  [id number] DOB: [birth year] DOA: 08/31/2019 PCP: Antonietta Jewel, MD    Brief Narrative:68 year old male with history of chronic back pain, multiple back surgeries, narcotic dependence, hypertension, chronic kidney disease stage III, anemia was initially admitted to [facility] for fever, chills and back pain. 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 He was transferred to [facility] for the same. 2D echo at outside hospital showed no vegetation. ID recommended daptomycin while inpatient and dalbavancin on discharge if discharging home, declined by SNF due to cost of daptomycin and home alone not felt to be safe at this time due to extremely limited mobility.  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  Transthoracic echo no vegetation from outside hospital.  CT scan showed discitis osteomyelitis at T10 and 11 with limited assessment for epidural abscess.  ID recommended daptomycin while inpatient till 10/26/2019.   They also recommended dalbavancin on discharge if patient is going home.  However patient lives alone and has very limited mobility and has no family and has right BKA. SNF declined due to high cost of daptomycin. Patient does not have his apartment keys until Saturday.  He is willing to go on Saturday however home health cannot get to him till Sunday to give his first dose of IV dalbavancin.  So he will be discharged home on Sunday.  #2  BPH urinary retention seems to have resolved.  Patient is able to urinate.  Continue Flomax and Proscar.  #3 constipation new MiraLAX Senokot and lactulose.  Need to make sure he does not remain constipated with urinary retention which can get  worse.  Bladder catheter was taken out 10/04/2019.  #4 AKI on CKD stage IV improved with hydration.  Renal ultrasound showed no acute hydronephrosis.  #5 chronic pain syndrome.  Patient is on OxyContin and Neurontin and Zanaflex as an outpatient.  Zanaflex restarted.  He has follow-up appointment with his PCP on Monday.  However he is requesting pain medications on discharge every 4 till he gets to see his PCP.  #6 type 2 diabetes continue SSI restart glipizide on discharge at a lower dose since blood sugar is stable.  #7 hypothyroidism continue Synthroid  #8 hypertension essential continue metoprolol hydralazine clonidine and Norvasc.  Blood pressure 154/82.  #9 left foot unstageable pressure ulcer present on admission continue wound care.    Pressure Injury 09/02/19 Heel Left Unstageable - Full thickness tissue loss in which the base of the ulcer is covered by slough (yellow, tan, gray, green or brown) and/or eschar (tan, brown or black) in the wound bed. (Active)  09/02/19 0815  Location: Heel  Location Orientation: Left  Staging: Unstageable - Full thickness tissue loss in which the base of the ulcer is covered by slough (yellow, tan, gray, green or brown) and/or eschar (tan, brown or black) in the wound bed.  Wound Description (Comments):   Present on Admission: Yes     Pressure Injury 09/02/19 Foot Left Unstageable - Full thickness tissue loss in which the base of the ulcer is covered by slough (yellow, tan, gray, green or brown) and/or eschar (tan, brown or black) in the wound bed. (Active)  09/02/19 [id number]  Location: Foot  Location Orientation: Left  Staging: Unstageable - Full thickness tissue loss in which the base of the ulcer is covered by slough (yellow, tan, gray, green or brown) and/or eschar (tan, brown or black) in the wound bed.  Wound Description (Comments):   Present on Admission: Yes      Nutrition Problem: Increased nutrient needs Etiology: acute  illness(MRSA bacteremia)     Signs/Symptoms: estimated needs    Interventions: Glucerna shake, Prostat  Estimated body mass index is 28.19 kg/m as calculated from the following:   Height as of this encounter: 6\' 6"  (1.981 m).   Weight as of this encounter: 110.6 kg. DVT prophylaxis: Heparin  code Status: Full code  family Communication: None at bedside  disposition Plan: Pending clinical improvement and further progress so patient can go home declined by SNF Consultants:   Infectious disease  Procedures: None Antimicrobials: Daptomycin  Subjective:  He is resting in bed in no active distress able to pass urine without the help of in and out catheter. Objective: Vitals:   10/05/19 2210 10/06/19 0500 10/06/19 0527 10/06/19 1439  BP: (!) 163/81  127/78 134/80  Pulse: 68  63 66  Resp: 18  18 18   Temp: 98.6 F (37 C)  98.2 F (36.8 C) 98.4 F (36.9 C)  TempSrc: Oral  Oral Oral  SpO2: 100%  99% 100%  Weight:  110.6 kg    Height:        Intake/Output Summary (Last 24 hours) at 10/06/2019 1541 Last data filed at 10/06/2019 1300 Gross per 24 hour  Intake 840 ml  Output 880 ml  Net -40 ml   Filed Weights   10/04/19 0500 10/05/19 0448 10/06/19 0500  Weight: 108.8 kg 108.5 kg 110.6 kg    Examination:  General exam: Appears calm and comfortable  Respiratory system: Clear to auscultation. Respiratory effort normal. Cardiovascular system: S1 & S2 heard, RRR. No JVD, murmurs, rubs, gallops or clicks. No pedal edema. Gastrointestinal system: Abdomen is nondistended, soft and nontender. No organomegaly or masses felt. Normal bowel sounds heard. Central nervous system: Alert and oriented. No focal neurological deficits. Extremities: Right BKA left ankle covered with a dressing.   Skin: No rashes, lesions or ulcers Psychiatry: Judgement and insight appear normal. Mood & affect appropriate.     Data Reviewed: I have personally reviewed following labs and imaging  studies  CBC: Recent Labs  Lab 10/01/19 0351 10/02/19 0340 10/03/19 0316 10/04/19 0442 10/05/19 0236  WBC 5.8 6.7 6.0 5.8 6.0  HGB 8.3* 7.8* 8.2* 7.9* 7.8*  HCT 25.3* 24.3* 25.0* 25.0* 23.7*  MCV 94.8 95.7 96.2 96.9 95.2  PLT 190 182 199 200 [id number]   Basic Metabolic Panel: Recent Labs  Lab 10/01/19 0351 10/02/19 0340 10/03/19 0316 10/04/19 0442 10/05/19 0236  NA 135 134* 133* 136 135  K 4.9 5.1 5.0 4.6 4.8  CL 99 100 100 103 102  CO2 24 24 22 24 23   GLUCOSE 144* 165* 179* 154* 204*  BUN 39* 37* 37* 37* 40*  CREATININE 3.13* 3.01* 2.89* 3.04* 2.96*  CALCIUM 8.8* 8.6* 8.5* 8.7* 8.8*   GFR: Estimated Creatinine Clearance: 34.4 mL/min (A) (by C-G formula based on SCr of 2.96 mg/dL (H)). Liver Function Tests: No results for input(s): AST, ALT, ALKPHOS, BILITOT, PROT, ALBUMIN in the last 168 hours. No results for input(s): LIPASE, AMYLASE in the last 168 hours. No results for input(s): AMMONIA in the last 168 hours. Coagulation Profile: No results for input(s): INR,  PROTIME in the last 168 hours. Cardiac Enzymes: Recent Labs  Lab 10/06/19 0330  CKTOTAL 226   BNP (last 3 results) No results for input(s): PROBNP in the last 8760 hours. HbA1C: No results for input(s): HGBA1C in the last 72 hours. CBG: Recent Labs  Lab 10/05/19 1235 10/05/19 1639 10/05/19 2208 10/06/19 0603 10/06/19 1155  GLUCAP 145* 119* 151* 132* 150*   Lipid Profile: No results for input(s): CHOL, HDL, LDLCALC, TRIG, CHOLHDL, LDLDIRECT in the last 72 hours. Thyroid Function Tests: No results for input(s): TSH, T4TOTAL, FREET4, T3FREE, THYROIDAB in the last 72 hours. Anemia Panel: No results for input(s): VITAMINB12, FOLATE, FERRITIN, TIBC, IRON, RETICCTPCT in the last 72 hours. Sepsis Labs: No results for input(s): PROCALCITON, LATICACIDVEN in the last 168 hours.  No results found for this or any previous visit (from the past 240 hour(s)).       Radiology Studies: No results  found.      Scheduled Meds: . allopurinol  100 mg Oral BID  . amLODipine  10 mg Oral Daily  . bisacodyl  10 mg Rectal Daily  . Chlorhexidine Gluconate Cloth  6 each Topical Daily  . cloNIDine  0.2 mg Oral BID  . ferrous sulfate  325 mg Oral BID WC  . finasteride  5 mg Oral Daily  . heparin  5,000 Units Subcutaneous Q8H  . hydrALAZINE  50 mg Oral Q8H  . insulin aspart  0-9 Units Subcutaneous TID WC  . levothyroxine  50 mcg Oral Q0600  . metoprolol tartrate  50 mg Oral BID  . multivitamin with minerals  1 tablet Oral Daily  . nutrition supplement (JUVEN)  1 packet Oral BID BM  . oxyCODONE  40 mg Oral Q12H  . polyethylene glycol  17 g Oral TID  . senna-docusate  1 tablet Oral BID  . sorbitol  15 mL Oral Daily  . tamsulosin  0.4 mg Oral Daily  . vitamin B-12  2,500 mcg Oral Daily   Continuous Infusions: . sodium chloride Stopped (09/28/19 2026)  . DAPTOmycin (CUBICIN)  IV Stopped (10/06/19 [id number])     LOS: 37 days     Georgette Shell, MD [facility]  If 7PM-7AM, please contact night-coverage [email or web address] Password [email or web address] 10/06/2019, 3:41 PM

## 2019-10-06 NOTE — Plan of Care (Signed)
  Problem: Pain Managment: Goal: General experience of comfort will improve Outcome: Progressing   Problem: Safety: Goal: Ability to remain free from injury will improve Outcome: Progressing   Problem: Skin Integrity: Goal: Risk for impaired skin integrity will decrease Outcome: Progressing   

## 2019-10-06 NOTE — Progress Notes (Signed)
Physical Therapy Treatment Patient Details Name: Thomas [name] MRN: [id number] DOB: 02/01/1951 Today's Date: 10/06/2019    History of Present Illness Patient is a 68 year old male history of diabetes mellitus type 2, hypertension, chronic kidney disease stage III, anemia, R BKA Sept 2019, who was admitted at [facility] after having fever chills and back pain. Patient transferred from [facility] for MRSA bacteremia. CT abdomen showing discitis involvling T10, T11 area.    PT Comments    Pt received in bed and agreeable to do a little of bit of PT. Pt performed bed mobility mod I utilizing bed rails to get EOB and return to supine. Pt independently able to roll and scoot in bed. Pt tolerated sitting EOB ~12 min. Pt able to maintain sitting balance with foot supported and single UE and no UE support. Pt performed LE therex with correct performance and pt stating exercises felt good and he had been doing some leg exercises and stretches on his own. After sitting EOB for several minutes for sitting balance/core strengthening and LE therex pt reporting severe 10/10 back pain because of how he had to lean back during therapy and when nursing came in to check IV and blood sugar. Pt deferring any further PT or mobility secondary to pain. Pt left in bed with all needs in reach and bed alarm set with nursing notified of request for pain medication. Pt will benefit from continued skilled acute PT to improve strength, ROM, balance and endurance deficits limiting functional mobility.    Follow Up Recommendations  Home health PT;Supervision for mobility/OOB     Equipment Recommendations  Rolling walker with 5" wheels;3in1 (PT)    Recommendations for Other Services       Precautions / Restrictions Precautions Precautions: Fall Precaution Comments: contact precautions MRSA, R BKA, prosthetic in room Restrictions Weight Bearing Restrictions: No    Mobility  Bed Mobility Overal bed  mobility: Modified Independent Bed Mobility: Supine to Sit;Sit to Supine           General bed mobility comments: mod I to get EOB, no cuing or physical assist required, pt utilized bed rails and HOB elevated, able to return to supine and roll and scoot in bed mod I with use of bed rails  Transfers                 General transfer comment: pt deferring due to increased pain  Ambulation/Gait             General Gait Details: pt deferred secondary to pain   Stairs             Wheelchair Mobility    Modified Rankin (Stroke Patients Only)       Balance Overall balance assessment: Needs assistance Sitting-balance support: Feet supported Sitting balance-[name]: Fair                                      Cognition Arousal/Alertness: Awake/alert Behavior During Therapy: WFL for tasks assessed/performed Overall Cognitive Status: Within Functional Limits for tasks assessed                                        Exercises Total Joint Exercises Straight Leg Raises: AROM;Both;10 reps Long Arc Quad: AROM;Both;20 reps Marching in Standing: AROM;Both;15 reps;Seated  General Comments        Pertinent Vitals/Pain Pain Score: 8  Pain Location: back Pain Descriptors / Indicators: Grimacing;Guarding;Sore;Constant Pain Intervention(s): Limited activity within patient's tolerance;Monitored during session;Repositioned    Home Living                      Prior Function            PT Goals (current goals can now be found in the care plan section) Progress towards PT goals: Progressing toward goals    Frequency    Min 2X/week      PT Plan Current plan remains appropriate    Co-evaluation              AM-PAC PT "6 Clicks" Mobility   Outcome Measure  Help needed turning from your back to your side while in a flat bed without using bedrails?: None Help needed moving from lying on your back to  sitting on the side of a flat bed without using bedrails?: None Help needed moving to and from a bed to a chair (including a wheelchair)?: A Little Help needed standing up from a chair using your arms (e.g., wheelchair or bedside chair)?: A Little Help needed to walk in hospital room?: A Little Help needed climbing 3-5 steps with a railing? : A Lot 6 Click Score: 19    End of Session   Activity Tolerance: Patient limited by pain Patient left: in bed;with call bell/phone within reach;with bed alarm set Nurse Communication: Mobility status;Patient requests pain meds PT Visit Diagnosis: Muscle weakness (generalized) (M62.81);Other abnormalities of gait and mobility (R26.89);Pain Pain - part of body: (back)     Time: [id number] PT Time Calculation (min) (ACUTE ONLY): 21 min  Charges:  $Therapeutic Activity: 8-22 mins                     Zachary George PT, DPT 4:49 PM,10/06/19    Alex Leahy Drucilla Chalet 10/06/2019, 4:45 PM

## 2019-10-06 NOTE — TOC Progression Note (Addendum)
Transition of Care [facility]) - Progression Note    Patient Details  Name: Thomas [name] MRN: [id number] Date of Birth: [birth year]  [facility] CM/SW Contact  Jamail Cullers, Edson Snowball, RN Phone Number: 10/06/2019, 11:42 AM  Clinical Narrative:     Discussed discharge with patient and MD. Patient will have his apartment keys on Saturday and is willing to discharge to home on Saturday if discharging MD is willing to give him a prescription for his pain medication to cover Sunday, he has appointment with his PCP on Monday. Attending will be here on Saturgay and will write prescription to cover Sunday only. Patient voices understanding.   Contacted [name] with [facility] to coordinate Saturday discharge.   Spoke to [employer] with [employer] , they would need patient to DC on Sunday after his antibiotic dose here and then receive his first dose at home on Monday. Issues with drug stability with the weekend. Explained same to patient , he is requesting script for pain medication every 4 hours when he leaves here on Sunday to "make it to Monday" Dr Rodena Piety will provide script.   Cory with [facility] and [name] with [facility] aware. Expected Discharge Plan: [place] Barriers to Discharge: Barriers Unresolved (comment)(no support at home , IV Cubin until 10/26/19,)  Expected Discharge Plan and Services Expected Discharge Plan: [place] Choice: [place] arrangements for the past 2 months: Single Family Home Expected Discharge Date: 09/08/19                                     Social Determinants of Health ([place]) Interventions    Readmission Risk Interventions Readmission Risk Prevention Plan 09/17/2019  Transportation Screening Complete  PCP or Specialist Appt within 5-7 Days Not Complete  Not Complete comments snf vs home  Home Care Screening Complete  Medication Review (RN CM) Complete  Some recent data might  be hidden

## 2019-10-07 LAB — GLUCOSE, CAPILLARY
Glucose-Capillary: 123 mg/dL — ABNORMAL HIGH (ref 70–99)
Glucose-Capillary: 142 mg/dL — ABNORMAL HIGH (ref 70–99)
Glucose-Capillary: 172 mg/dL — ABNORMAL HIGH (ref 70–99)
Glucose-Capillary: 162 mg/dL — ABNORMAL HIGH (ref 70–99)

## 2019-10-07 MED ORDER — OXYCODONE HCL 20 MG PO TABS: 1.0000 | ORAL_TABLET | Freq: Three times a day (TID) | ORAL | 0 refills | Status: DC | PRN

## 2019-10-07 MED ORDER — SODIUM CHLORIDE 0.9 % IV SOLN
1000.0000 mg | INTRAVENOUS | Status: DC
  Administered 2019-10-09: 1000 mg via INTRAVENOUS
  Filled 2019-10-07: qty 20

## 2019-10-07 NOTE — Plan of Care (Signed)

## 2019-10-07 NOTE — Progress Notes (Signed)
PROGRESS NOTE    Thomas [name]  [id number] DOB: 02-16-1951 DOA: 08/31/2019 PCP: Thomas Jewel, MD   Brief Narrative36 year old male with history of chronic back pain, multiple back surgeries, narcotic dependence, hypertension, chronic kidney disease stage III, anemia was initially admitted to [facility] for fever, chills and back pain. 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 He was transferred to [facility] for the same. 2D echo at outside hospital showed no vegetation. ID recommended daptomycin while inpatient and dalbavancin on discharge if discharging home, declined by SNF due to cost of daptomycin and home alone not felt to be safe at this time due to extremely limited mobility.   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 Transthoracic echo no vegetation from outside hospital. CT scan showed discitis osteomyelitis at T10 and 11 with limited assessment for epidural abscess. ID recommended daptomycin while inpatient . Patient will be discharged home Sunday, 10/09/2019.  He will get his first dose of dalbavancin on 10/10/2019.  PT saw him and recommended SNF but he was refusing to go to skilled nursing facility.  #2  BPH urinary retention resolved  Patient is able to urinate.  Continue Flomax and Proscar.  #3 constipation new MiraLAX Senokot and lactulose. Need to make sure he does not remain constipated with urinary retention which can get worse. Bladder catheter was taken out 10/04/2019.  #4 AKI on CKD stage IV improved with hydration. Renal ultrasound showed no acute hydronephrosis.  #5 chronic pain syndrome. Patient is on OxyContin and Neurontin and Zanaflex as an outpatient. Zanaflex restarted.   He has follow-up appointment with his PCP on Monday.  However he is requesting pain medications on discharge every 4 till he gets to see his PCP. I have written prescription for oxycodone 20 mg 1 tablet every 8 as needed.  This was sent to the pharmacy 10/07/2019.  #6 type 2 diabetes continue SSI restart glipizide on discharge at a lower dose since blood sugar is stable.  #7 hypothyroidism continue Synthroid  #8 hypertension essential continue metoprolol hydralazine clonidine and Norvasc. Blood pressure 154/82.  #9 left foot unstageable pressure ulcer present on admission continue wound care.   Pressure Injury 09/02/19 Heel Left Unstageable - Full thickness tissue loss in which the base of the ulcer is covered by slough (yellow, tan, gray, green or brown) and/or eschar (tan, brown or black) in the wound bed. (Active)  09/02/19 0815  Location: Heel  Location Orientation: Left  Staging: Unstageable - Full thickness tissue loss in which the base of the ulcer is covered by slough (yellow, tan, gray, green or brown) and/or eschar (tan, brown or black) in the wound bed.  Wound Description (Comments):   Present on Admission: Yes     Pressure Injury 09/02/19 Foot Left Unstageable - Full thickness tissue loss in which the base of the ulcer is covered by slough (yellow, tan, gray, green or brown) and/or eschar (tan, brown or black) in the wound bed. (Active)  09/02/19 0815  Location: Foot  Location Orientation: Left  Staging: Unstageable - Full thickness tissue loss in which the base of the ulcer is covered by slough (yellow, tan, gray, green or brown) and/or eschar (tan, brown or black) in  the wound bed.  Wound Description (Comments):   Present on Admission: Yes      Nutrition Problem: Increased nutrient needs Etiology: acute illness(MRSA bacteremia)     Signs/Symptoms: estimated needs    Interventions: Glucerna shake, Prostat  Estimated body mass index is 28.56 kg/m as  calculated from the following:   Height as of this encounter: 6\' 6"  (1.981 m).   Weight as of this encounter: 112.1 kg. DVT prophylaxis:Heparin  code Status:Full code  family Communication:None at bedside  disposition Plan:Pending clinical improvement and further progress so patient can go home declined by SNF Consultants:  Infectious disease  Procedures:None Antimicrobials: Daptomycin   Subjective: Resting in bed no new complaints wants to have pain medication ordered for Sunday and Monday prior to he can see his PCP on Monday.  Objective: Vitals:   10/06/19 0527 10/06/19 1439 10/06/19 2122 10/07/19 0605  BP: 127/78 134/80 (!) 147/77 (!) 150/78  Pulse: 63 66 61 (!) 53  Resp: 18 18 17 14   Temp: 98.2 F (36.8 C) 98.4 F (36.9 C) 97.9 F (36.6 C) 97.7 F (36.5 C)  TempSrc: Oral Oral Oral Oral  SpO2: 99% 100% 98% 99%  Weight:    112.1 kg  Height:        Intake/Output Summary (Last 24 hours) at 10/07/2019 1141 Last data filed at 10/07/2019 0945 Gross per 24 hour  Intake 1010 ml  Output 1550 ml  Net -540 ml   Filed Weights   10/05/19 0448 10/06/19 0500 10/07/19 0605  Weight: 108.5 kg 110.6 kg 112.1 kg    Examination:  General exam: Appears calm and comfortable  Respiratory system: Clear to auscultation. Respiratory effort normal. Cardiovascular system: S1 & S2 heard, RRR. No JVD, murmurs, rubs, gallops or clicks. No pedal edema. Gastrointestinal system: Abdomen is nondistended, soft and nontender. No organomegaly or masses felt. Normal bowel sounds heard. Central nervous system: Alert and oriented. No focal neurological deficits. Extremities: Left foot covered with dressings.  Right BKA. Skin: No rashes, lesions or ulcers Psychiatry: Judgement and insight appear normal. Mood & affect appropriate.     Data Reviewed: I have personally reviewed following labs and imaging studies  CBC: Recent Labs  Lab 10/01/19 0351 10/02/19 0340 10/03/19 0316  10/04/19 0442 10/05/19 0236  WBC 5.8 6.7 6.0 5.8 6.0  HGB 8.3* 7.8* 8.2* 7.9* 7.8*  HCT 25.3* 24.3* 25.0* 25.0* 23.7*  MCV 94.8 95.7 96.2 96.9 95.2  PLT 190 182 199 200 [id number]   Basic Metabolic Panel: Recent Labs  Lab 10/01/19 0351 10/02/19 0340 10/03/19 0316 10/04/19 0442 10/05/19 0236  NA 135 134* 133* 136 135  K 4.9 5.1 5.0 4.6 4.8  CL 99 100 100 103 102  CO2 24 24 22 24 23   GLUCOSE 144* 165* 179* 154* 204*  BUN 39* 37* 37* 37* 40*  CREATININE 3.13* 3.01* 2.89* 3.04* 2.96*  CALCIUM 8.8* 8.6* 8.5* 8.7* 8.8*   GFR: Estimated Creatinine Clearance: 34.6 mL/min (A) (by C-G formula based on SCr of 2.96 mg/dL (H)). Liver Function Tests: No results for input(s): AST, ALT, ALKPHOS, BILITOT, PROT, ALBUMIN in the last 168 hours. No results for input(s): LIPASE, AMYLASE in the last 168 hours. No results for input(s): AMMONIA in the last 168 hours. Coagulation Profile: No results for input(s): INR, PROTIME in the last 168 hours. Cardiac Enzymes: Recent Labs  Lab 10/06/19 0330  CKTOTAL 226   BNP (last 3 results) No results for input(s): PROBNP in the last 8760 hours. HbA1C: No results  for input(s): HGBA1C in the last 72 hours. CBG: Recent Labs  Lab 10/06/19 0603 10/06/19 1155 10/06/19 1637 10/06/19 2119 10/07/19 0806  GLUCAP 132* 150* 199* 169* 123*   Lipid Profile: No results for input(s): CHOL, HDL, LDLCALC, TRIG, CHOLHDL, LDLDIRECT in the last 72 hours. Thyroid Function Tests: No results for input(s): TSH, T4TOTAL, FREET4, T3FREE, THYROIDAB in the last 72 hours. Anemia Panel: No results for input(s): VITAMINB12, FOLATE, FERRITIN, TIBC, IRON, RETICCTPCT in the last 72 hours. Sepsis Labs: No results for input(s): PROCALCITON, LATICACIDVEN in the last 168 hours.  Recent Results (from the past 240 hour(s))  Culture, Urine     Status: Abnormal   Collection Time: 10/05/19 10:44 PM   Specimen: Urine, Clean Catch  Result Value Ref Range Status   Specimen Description  URINE, CLEAN CATCH  Final   Special Requests NONE  Final   Culture (A)  Final    <10,000 COLONIES/mL INSIGNIFICANT GROWTH Performed at [facility], [address], [place], [place] [address]    Report Status 10/06/2019 FINAL  Final         Radiology Studies: No results found.      Scheduled Meds: . allopurinol  100 mg Oral BID  . amLODipine  10 mg Oral Daily  . bisacodyl  10 mg Rectal Daily  . Chlorhexidine Gluconate Cloth  6 each Topical Daily  . cloNIDine  0.2 mg Oral BID  . ferrous sulfate  325 mg Oral BID WC  . finasteride  5 mg Oral Daily  . heparin  5,000 Units Subcutaneous Q8H  . hydrALAZINE  50 mg Oral Q8H  . insulin aspart  0-9 Units Subcutaneous TID WC  . levothyroxine  50 mcg Oral Q0600  . metoprolol tartrate  50 mg Oral BID  . multivitamin with minerals  1 tablet Oral Daily  . nutrition supplement (JUVEN)  1 packet Oral BID BM  . oxyCODONE  40 mg Oral Q12H  . polyethylene glycol  17 g Oral TID  . senna-docusate  1 tablet Oral BID  . sorbitol  15 mL Oral Daily  . tamsulosin  0.4 mg Oral Daily  . vitamin B-12  2,500 mcg Oral Daily   Continuous Infusions: . sodium chloride Stopped (09/28/19 2026)  . DAPTOmycin (CUBICIN)  IV 1,000 mg (10/06/19 2052)  . [START ON 10/09/2019] DAPTOmycin (CUBICIN)  IV       LOS: 37 days     Georgette Shell, MD [facility]  If 7PM-7AM, please contact night-coverage [email or web address] Password [email or web address] 10/07/2019, 11:41 AM

## 2019-10-08 LAB — GLUCOSE, CAPILLARY
Glucose-Capillary: 128 mg/dL — ABNORMAL HIGH (ref 70–99)
Glucose-Capillary: 118 mg/dL — ABNORMAL HIGH (ref 70–99)
Glucose-Capillary: 139 mg/dL — ABNORMAL HIGH (ref 70–99)
Glucose-Capillary: 165 mg/dL — ABNORMAL HIGH (ref 70–99)

## 2019-10-08 LAB — CREATININE, SERUM
Creatinine, Ser: 2.8 mg/dL — ABNORMAL HIGH (ref 0.61–1.24)
GFR calc Af Amer: 26 mL/min — ABNORMAL LOW (ref >60)
GFR calc non Af Amer: 22 mL/min — ABNORMAL LOW (ref >60)

## 2019-10-08 NOTE — Progress Notes (Signed)
PROGRESS NOTE    TREGG ETTERS  [id number] DOB: [birth year] DOA: 08/31/2019 PCP: Antonietta Jewel, MD  Brief Narrative:68 year old male with history of chronic back pain, multiple back surgeries, narcotic dependence, hypertension, chronic kidney disease stage III, anemia was initially admitted to [facility] for fever, chills and back pain. 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 He was transferred to [facility] for the same. 2D echo at outside hospital showed no vegetation. ID recommended daptomycin while inpatient and dalbavancin on discharge if discharging home, declined by SNF due to cost of daptomycin and home alone not felt to be safe at this time due to extremely limited mobility.  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 Transthoracic echo no vegetation from outside hospital. CT scan showed discitis osteomyelitis at T10 and 11 with limited assessment for epidural abscess. ID recommended daptomycin while inpatient . Patient will be discharged home Sunday, 10/09/2019.  He will get his first dose of dalbavancin on 10/10/2019.  PT saw him and recommended SNF but he was refusing to go to skilled nursing facility in skilled nursing facility did not accept him due to the fact he was on daptomycin which is expensive. DC home 10/09/2019 DC PICC line prior to discharge He will get dalbavancin q. 7 days for 3 doses so he really does not need the PICC line on discharge home health will put peripheral IV.  #2BPH urinary retention resolved Patient is able to urinate. Continue Flomax and Proscar.  #3 constipation new MiraLAX Senokot and lactulose. Need to make sure he does not remain constipated with urinary  retention which can get worse. Bladder catheter was taken out 10/04/2019.  #4 AKI on CKD stage IV improved with hydration. Renal ultrasound showed no acute hydronephrosis.  #5 chronic pain syndrome. Patient is on OxyContin and Neurontin and Zanaflex as an outpatient. Zanaflex restarted.He has follow-up appointment with his PCP on Monday.  I have already sent prescription for oxycodone to his pharmacy so they can pick it up today.  This oxycodone will cover him till Monday when he gets to see his PCP.  I have written prescription for oxycodone 20 mg 1 tablet every 8 as needed.  This was sent to the pharmacy 10/07/2019.  #6 type 2 diabetes continue SSI restart glipizide on discharge at a lower dose since blood sugar is stable.  #7 hypothyroidism continue Synthroid  #8 hypertension essential continue metoprolol hydralazine clonidine and Norvasc. Blood pressure 154/82.  #9 left foot unstageable pressure ulcer present on admission continue wound care.   Pressure Injury 09/02/19 Heel Left Unstageable - Full thickness tissue loss in which the base of the ulcer is covered by slough (yellow, tan, gray, green or brown) and/or eschar (tan, brown or black) in the wound bed. (Active)  09/02/19 0815  Location: Heel  Location Orientation: Left  Staging: Unstageable - Full thickness tissue loss in which the base of the ulcer is covered by slough (yellow, tan, gray, green or brown) and/or eschar (tan, brown or black) in the wound bed.  Wound Description (Comments):   Present on Admission: Yes     Pressure Injury 09/02/19 Foot Left Unstageable - Full thickness tissue loss in which the base of the ulcer  is covered by slough (yellow, tan, gray, green or brown) and/or eschar (tan, brown or black) in the wound bed. (Active)  09/02/19 0815  Location: Foot  Location Orientation: Left  Staging: Unstageable - Full thickness tissue loss in which the base of the ulcer is covered by slough (yellow, tan,  gray, green or brown) and/or eschar (tan, brown or black) in the wound bed.  Wound Description (Comments):   Present on Admission: Yes      Nutrition Problem: Increased nutrient needs Etiology: acute illness(MRSA bacteremia)     Signs/Symptoms: estimated needs    Interventions: Glucerna shake, Prostat  Estimated body mass index is 28.56 kg/m as calculated from the following:   Height as of this encounter: 6\' 6"  (1.981 m).   Weight as of this encounter: 112.1 kg.  DVT prophylaxis:Heparin  code Status:Full code  family Communication:None at bedside  disposition Plan:Pending clinical improvement and further progress so patient can go home declined by SNF He will be discharged home tomorrow Sunday, 10/09/2019 O. Pat orders are done His PICC line needs to be DC'd tomorrow after IV antibiotics. Consultants:  Infectious disease  Procedures:None Antimicrobials:Daptomycin  Subjective: Resting in bed no new complaints anxious to go home able to urinate without help a Foley. His friend will pick him up tomorrow Objective: Vitals:   10/07/19 0605 10/07/19 1431 10/07/19 2056 10/08/19 0523  BP: (!) 150/78 118/73 (!) 144/83 135/82  Pulse: (!) 53 (!) 55 (!) 53 62  Resp: 14 18 16 16   Temp: 97.7 F (36.5 C) 98.4 F (36.9 C) 97.9 F (36.6 C) 98.2 F (36.8 C)  TempSrc: Oral Oral Oral Oral  SpO2: 99% 100% 100% 98%  Weight: 112.1 kg 112.6 kg  112.1 kg  Height:        Intake/Output Summary (Last 24 hours) at 10/08/2019 1357 Last data filed at 10/08/2019 [id number] Gross per 24 hour  Intake 240 ml  Output 400 ml  Net -160 ml   Filed Weights   10/07/19 0605 10/07/19 1431 10/08/19 0523  Weight: 112.1 kg 112.6 kg 112.1 kg    Examination:  General exam: Appears calm and comfortable  Respiratory system: Clear to auscultation. Respiratory effort normal. Cardiovascular system: S1 & S2 heard, RRR. No JVD, murmurs, rubs, gallops or clicks. No pedal edema. Gastrointestinal  system: Abdomen is nondistended, soft and nontender. No organomegaly or masses felt. Normal bowel sounds heard. Central nervous system: Alert and oriented. No focal neurological deficits. Extremities: Symmetric 5 x 5 power. Skin: No rashes, lesions or ulcers Psychiatry: Judgement and insight appear normal. Mood & affect appropriate.     Data Reviewed: I have personally reviewed following labs and imaging studies  CBC: Recent Labs  Lab 10/02/19 0340 10/03/19 0316 10/04/19 0442 10/05/19 0236  WBC 6.7 6.0 5.8 6.0  HGB 7.8* 8.2* 7.9* 7.8*  HCT 24.3* 25.0* 25.0* 23.7*  MCV 95.7 96.2 96.9 95.2  PLT 182 199 200 [id number]   Basic Metabolic Panel: Recent Labs  Lab 10/02/19 0340 10/03/19 0316 10/04/19 0442 10/05/19 0236 10/08/19 0333  NA 134* 133* 136 135  --   K 5.1 5.0 4.6 4.8  --   CL 100 100 103 102  --   CO2 24 22 24 23   --   GLUCOSE 165* 179* 154* 204*  --   BUN 37* 37* 37* 40*  --   CREATININE 3.01* 2.89* 3.04* 2.96* 2.80*  CALCIUM 8.6* 8.5* 8.7* 8.8*  --    GFR: Estimated Creatinine Clearance: 36.6 mL/min (A) (  by C-G formula based on SCr of 2.8 mg/dL (H)). Liver Function Tests: No results for input(s): AST, ALT, ALKPHOS, BILITOT, PROT, ALBUMIN in the last 168 hours. No results for input(s): LIPASE, AMYLASE in the last 168 hours. No results for input(s): AMMONIA in the last 168 hours. Coagulation Profile: No results for input(s): INR, PROTIME in the last 168 hours. Cardiac Enzymes: Recent Labs  Lab 10/06/19 0330  CKTOTAL 226   BNP (last 3 results) No results for input(s): PROBNP in the last 8760 hours. HbA1C: No results for input(s): HGBA1C in the last 72 hours. CBG: Recent Labs  Lab 10/07/19 1142 10/07/19 1721 10/07/19 2052 10/08/19 0740 10/08/19 1200  GLUCAP 142* 172* 162* 128* 118*   Lipid Profile: No results for input(s): CHOL, HDL, LDLCALC, TRIG, CHOLHDL, LDLDIRECT in the last 72 hours. Thyroid Function Tests: No results for input(s): TSH, T4TOTAL,  FREET4, T3FREE, THYROIDAB in the last 72 hours. Anemia Panel: No results for input(s): VITAMINB12, FOLATE, FERRITIN, TIBC, IRON, RETICCTPCT in the last 72 hours. Sepsis Labs: No results for input(s): PROCALCITON, LATICACIDVEN in the last 168 hours.  Recent Results (from the past 240 hour(s))  Culture, Urine     Status: Abnormal   Collection Time: 10/05/19 10:44 PM   Specimen: Urine, Clean Catch  Result Value Ref Range Status   Specimen Description URINE, CLEAN CATCH  Final   Special Requests NONE  Final   Culture (A)  Final    <10,000 COLONIES/mL INSIGNIFICANT GROWTH Performed at [facility], [address] [place], [place] [address]    Report Status 10/06/2019 FINAL  Final         Radiology Studies: No results found.      Scheduled Meds: . allopurinol  100 mg Oral BID  . amLODipine  10 mg Oral Daily  . bisacodyl  10 mg Rectal Daily  . Chlorhexidine Gluconate Cloth  6 each Topical Daily  . cloNIDine  0.2 mg Oral BID  . ferrous sulfate  325 mg Oral BID WC  . finasteride  5 mg Oral Daily  . heparin  5,000 Units Subcutaneous Q8H  . hydrALAZINE  50 mg Oral Q8H  . insulin aspart  0-9 Units Subcutaneous TID WC  . levothyroxine  50 mcg Oral Q0600  . metoprolol tartrate  50 mg Oral BID  . multivitamin with minerals  1 tablet Oral Daily  . nutrition supplement (JUVEN)  1 packet Oral BID BM  . oxyCODONE  40 mg Oral Q12H  . polyethylene glycol  17 g Oral TID  . senna-docusate  1 tablet Oral BID  . sorbitol  15 mL Oral Daily  . tamsulosin  0.4 mg Oral Daily  . vitamin B-12  2,500 mcg Oral Daily   Continuous Infusions: . sodium chloride Stopped (09/28/19 2026)  . DAPTOmycin (CUBICIN)  IV 1,000 mg (10/07/19 2001)  . [START ON 10/09/2019] DAPTOmycin (CUBICIN)  IV       LOS: 73 days     Georgette Shell, MD [facility]  If 7PM-7AM, please contact night-coverage [email or web address] Password [email or web address] 10/08/2019, 1:57 PM

## 2019-10-09 LAB — GLUCOSE, CAPILLARY
Glucose-Capillary: 144 mg/dL — ABNORMAL HIGH (ref 70–99)
Glucose-Capillary: 116 mg/dL — ABNORMAL HIGH (ref 70–99)

## 2019-10-09 MED ORDER — FINASTERIDE 5 MG PO TABS: 5.0000 mg | ORAL_TABLET | Freq: Every day | ORAL | 0 refills | Status: DC

## 2019-10-09 MED ORDER — SENNOSIDES-DOCUSATE SODIUM 8.6-50 MG PO TABS: 1.0000 | ORAL_TABLET | Freq: Two times a day (BID) | ORAL | Status: DC

## 2019-10-09 MED ORDER — JUVEN PO PACK: 1.0000 | PACK | Freq: Two times a day (BID) | ORAL | 0 refills | Status: DC

## 2019-10-09 MED ORDER — ADULT MULTIVITAMIN W/MINERALS CH: 1.0000 | ORAL_TABLET | Freq: Every day | ORAL | Status: DC

## 2019-10-09 NOTE — Progress Notes (Signed)
Orders received to discontinue R IJ. Sutures removed without difficulty. 4x4, 2x2, and vasoline gauze used over site during removal. Valsalva maneuver used during removal. Pressure held for 15 minutes. Gauze CDI upon leaving room. Primary RN informed of removal. Patient instructed s/s of infection and to inform medical staff if it area oozes or the dressing feels wet.

## 2019-10-09 NOTE — Care Management (Signed)
No change to the plan for Home IV infusion. Patient will dc after antibiotic dose today and RN will visit tomorrow to start home infusions.  [name] with [employer] and Tommi Rumps with [facility] aware of d/c order.

## 2019-10-09 NOTE — Progress Notes (Signed)
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 Patient transferred from [facility] for MRSA bacteremia. CT abdomen showing discitis involvling T10, T11 area.   OT comments  Limited session due to back pain. Pt reports his leg fell off the bed earlier today and triggered severe back pain. Attempted to sit EOB, but unable to tolerate to participate in ADL. Pt has a reacher at home, declines need for further AE. Pt stating he did not anticipate any problems completing IADL once he goes home, including being able to drive, purchase groceries and get them into his home.  Educated in pre ordering/delivery options. Pt distracted by his pain and non-receptive.   Follow Up Recommendations  Home health OT;Supervision/Assistance - 24 hour(pt is adamantly refusing SNF)    Equipment Recommendations       Recommendations for Other Services      Precautions / Restrictions Precautions Precautions: Fall Precaution Comments: contact precautions MRSA, R BKA, prosthetic in room Restrictions Weight Bearing Restrictions: No       Mobility Bed Mobility Overal bed mobility: Modified Independent Bed Mobility: Sidelying to Sit;Sit to Sidelying   Sidelying to sit: Mod assist     Sit to sidelying: Mod assist General bed mobility comments: pt in sidelying upon arrival, requiring moderate assistance due to reported back pain.  Transfers                 General transfer comment: pt deferring due to increased pain    Balance Overall balance assessment: Needs assistance Sitting-balance support: Feet supported;Bilateral upper extremity supported Sitting balance-Leahy  Scale: Fair Sitting balance - Comments: bracing with UEs due to back pain                                   ADL either performed or assessed with clinical judgement   ADL Overall ADL's : Needs assistance/impaired     Grooming: Wash/dry face;Sitting;Set up Grooming Details (indicate cue type and reason): cool washcloth to face, pt reporting he was hot       Lower Body Bathing Details (indicate cue type and reason): recommended long handled bath sponge, use of reacher to wash and dry feet       Lower Body Dressing Details (indicate cue type and reason): reports he has a reacher and typically wear a boot                General ADL Comments: Attempted to engage pt in conversation about how he will manage getting groceries, preparing meals at home. Pt stating he will drive himself to the store and does not think he will have any difficulty getting around the store or loading and unloading his car. Educated in option of pre ordering his groceries.      Vision       Perception     Praxis      Cognition Arousal/Alertness: Awake/alert Behavior During Therapy: WFL for tasks assessed/performed Overall Cognitive Status: Impaired/Different from baseline                           Safety/Judgement: Decreased  awareness of safety;Decreased awareness of deficits     General Comments: Pt with poor awareness of ability to manage IADL once discharged.         Exercises     Shoulder Instructions       General Comments      Pertinent Vitals/ Pain       Pain Assessment: Faces Faces Pain Scale: Hurts whole lot Pain Location: back Pain Descriptors / Indicators: Grimacing;Guarding;Sore;Constant Pain Intervention(s): Repositioned;Monitored during session;Limited activity within patient's tolerance  Home Living                                          Prior Functioning/Environment              Frequency  Min 2X/week         Progress Toward Goals  OT Goals(current goals can now be found in the care plan section)  Progress towards OT goals: Not progressing toward goals - comment(pain limiting)  Acute Rehab OT Goals Patient Stated Goal: pain relief OT Goal Formulation: With patient Time For Goal Achievement: 10/19/19 Potential to Achieve Goals: Good  Plan Discharge plan remains appropriate    Co-evaluation                 AM-PAC OT "6 Clicks" Daily Activity     Outcome Measure   Help from another person eating meals?: None Help from another person taking care of personal grooming?: A Little Help from another person toileting, which includes using toliet, bedpan, or urinal?: A Little Help from another person bathing (including washing, rinsing, drying)?: A Lot Help from another person to put on and taking off regular upper body clothing?: A Little Help from another person to put on and taking off regular lower body clothing?: A Lot 6 Click Score: 17    End of Session    OT Visit Diagnosis: Pain   Activity Tolerance     Patient Left in bed;with call bell/phone within reach;with bed alarm set   Nurse Communication          Time: [id number] OT Time Calculation (min): 20 min  Charges: OT General Charges $OT Visit: 1 Visit OT Treatments $Self Care/Home Management : 8-22 mins  Thomas [name], OTR/L Acute Rehabilitation Services Pager: [phone] Office: [phone]  Thomas [name] 10/09/2019, 11:36 AM

## 2019-10-10 NOTE — Discharge Summary (Signed)
Physician Discharge Summary  Thomas [name] [id number] DOB: 10-11-1951 DOA: 08/31/2019  PCP: Antonietta Jewel, MD  Admit date: 08/31/2019 Discharge date: 10/09/2019  Admitted From: Home.  Disposition:  Home.   Recommendations for Outpatient Follow-up:  1. Follow up with PCP in 1-2 weeks 2. Please obtain BMP/CBC in one week 3. Please follow up with infectious disease in 3 to 4 weeks.   Home Health: yes   Discharge Condition:guarded.  CODE STATUS:full code.  Diet recommendation: Heart Healthy  Brief/Interim Summary: 68 year old male with history of chronic back pain, multiple back surgeries, narcotic dependence, hypertension, chronic kidney disease stage III, anemia was initially admitted to [facility] for fever, chills and back pain. 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 He was transferred to [facility] for the same. 2D echo at outside hospital showed no vegetation. ID recommended daptomycin while inpatient and dalbavancin on discharge if discharging home, declined by SNF due to cost of daptomycin. Hence he wanted to be discharged home with home health .   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 Transthoracic echo no vegetation from outside hospital. CT scan showed discitis osteomyelitis at T10 and 11 with limited assessment for epidural abscess. ID recommended daptomycin while inpatient. Patient will be discharged home Sunday, 10/09/2019. He will get his first dose of dalbavancin on 10/10/2019. PT saw him and recommended SNF but he was refusing to go to skilled nursing facility and in addition  skilled nursing facility did not accept him due to the fact he was on daptomycin which is expensive. DC  home 10/09/2019 DC PICC line prior to discharge He will get dalbavancin q. 7 days for 3 doses so he really does not need the PICC line on discharge.  #2BPH urinary retentionresolvedPatient is able to urinate. Continue Flomax and Proscar.  #3 constipation new MiraLAX Senokot and lactulose.   #4 AKI on CKD stage IV improved with hydration.  Renal ultrasound showed no acute hydronephrosis.  #5 chronic pain syndrome. Patient is on OxyContin and Neurontin and Zanaflex as an outpatient.   #6 type 2 diabetes mellitus.  Resume home meds  #7 hypothyroidism continue Synthroid  #8 essential hypertension Well controlled bp parameters.   #9 left foot unstageable pressure ulcer present on admission continue wound care.   Pressure Injury 09/02/19 Heel Left Unstageable - Full thickness tissue loss in which the base of the ulcer is covered by slough (yellow, tan, gray, green or brown) and/or eschar (tan, brown or black) in the wound bed. (Active)  09/02/19 0815  Location: Heel  Location Orientation: Left  Staging: Unstageable - Full thickness tissue loss in which the base of the ulcer is covered by slough (yellow, tan, gray, green or brown) and/or eschar (tan, brown or black) in the wound bed.  Wound Description (Comments):   Present on Admission: Yes     Pressure Injury 09/02/19 Foot Left Unstageable - Full thickness tissue loss in which the base of the ulcer is covered by slough (yellow, tan, gray, green or brown) and/or eschar (tan, brown or black) in the wound bed. (Active)  09/02/19 0815  Location: Foot  Location Orientation: Left  Staging: Unstageable - Full thickness tissue loss in which the base of the  ulcer is covered by slough (yellow, tan, gray, green or brown) and/or eschar (tan, brown or black) in the wound bed.  Wound Description (Comments):   Present on Admission: Yes     Discharge Instructions  Discharge Instructions    Diet - low sodium heart healthy    Complete by: As directed    Diet - low sodium heart healthy   Complete by: As directed    Discharge instructions   Complete by: As directed    You will need to complete your antibiotics as prescribed and need follow-up probably with your podiatrist in the outpatient setting-home health will come out and teach you how to use the IV infusion site and once your antibiotics run through the PICC line will need to be removed You will need labs as per the protocol I would recommend that you take the steroids for short period of time as per my tapering instructions and have called them into your pharmacy You have been prescribed a very limited amount of Dilaudid and this will need to be refilled on an as-needed basis by your primary care physician you can get your oxycodone through him You will need local wound care and Prevalon boot placed at all times when you are laying around Please comply with the blood pressure med increases and we have called in higher doses of those medications on discharge   Discharge instructions   Complete by: As directed    Please follow up with home health and Infectious disease as recommended.   Home infusion instructions [facility] May follow [place] Dosing Protocol; May administer Cathflo as needed to maintain patency of vascular access device.; Flushing of vascular access device: per [facility] Protocol: 0.9% NaCl pre/post medica...   Complete by: As directed    Instructions: May follow [place] Dosing Protocol   Instructions: May administer Cathflo as needed to maintain patency of vascular access device.   Instructions: Flushing of vascular access device: per [facility] Protocol: 0.9% NaCl pre/post medication administration and prn patency; Heparin 100 u/ml, 69ml for implanted ports and Heparin 10u/ml, 83ml for all other central venous catheters.   Instructions: May follow [facility] Anaphylaxis Protocol for First Dose Administration in the home: 0.9% NaCl at 25-50 ml/hr to  maintain IV access for protocol meds. Epinephrine 0.3 ml IV/IM PRN and Benadryl 25-50 IV/IM PRN s/s of anaphylaxis.   Instructions: [place] Infusion Coordinator (RN) to assist per patient IV care needs in the home PRN.   Home infusion instructions [facility] May follow [place] Dosing Protocol; May administer Cathflo as needed to maintain patency of vascular access device.; Flushing of vascular access device: per [facility] Protocol: 0.9% NaCl pre/post medica...   Complete by: As directed    Instructions: May follow [place] Dosing Protocol   Instructions: May administer Cathflo as needed to maintain patency of vascular access device.   Instructions: Flushing of vascular access device: per [facility] Protocol: 0.9% NaCl pre/post medication administration and prn patency; Heparin 100 u/ml, 40ml for implanted ports and Heparin 10u/ml, 19ml for all other central venous catheters.   Instructions: May follow [facility] Anaphylaxis Protocol for First Dose Administration in the home: 0.9% NaCl at 25-50 ml/hr to maintain IV access for protocol meds. Epinephrine 0.3 ml IV/IM PRN and Benadryl 25-50 IV/IM PRN s/s of anaphylaxis.   Instructions: [place] Infusion Coordinator (RN) to assist per patient IV care needs in the home PRN.   Increase activity slowly   Complete  by: As directed      Allergies as of 10/09/2019      Reactions   Other Other (See Comments)   Blood pressure issues Per [facility] hospital: Pt states he can only take these pain meds or else he gets very sick: Oxycontin,morphine,demerol, and dilaudid are the only pain meds pt states he can take.    Oxycodone Other (See Comments)   Cases extreme changes in Blood pressure Cases extreme changes in Blood pressure   Oxycodone-acetaminophen Other (See Comments)   Blood pressure issues, rash from acetaminophen, pt states he can take tylenol though.   Oxymorphone Other (See Comments)   Causes kidney problems   Aspirin Nausea And  Vomiting   Per [facility]   Beta Vulgaris Other (See Comments)   Buspirone Other (See Comments)   Cabbage Other (See Comments)   Codeine Other (See Comments)   Fish Allergy Other (See Comments)   Fish-derived Products    Pentazocine    Propoxyphene Other (See Comments)   Shellfish Allergy Other (See Comments)   Shellfish-derived Products    Sulfa Antibiotics Hives   Sulfasalazine Other (See Comments)   Amoxicillin Rash   Per [facility]      Medication List    STOP taking these medications   daptomycin  IVPB Commonly known as: CUBICIN   furosemide 40 MG tablet Commonly known as: LASIX   magnesium oxide 400 MG tablet Commonly known as: MAG-OX     TAKE these medications   allopurinol 100 MG tablet Commonly known as: ZYLOPRIM Take 100 mg by mouth 2 (two) times daily.   amLODipine 10 MG tablet Commonly known as: NORVASC Take 1 tablet by mouth daily.   BENEFIBER PO Take 1 each by mouth daily.   cloNIDine 0.3 MG tablet Commonly known as: CATAPRES Take 0.3 mg by mouth 3 (three) times daily. Notes to patient: Today at 2 pm   Dalvance 500 MG Solr Generic drug: dalbavancin Give Dalvance 1500 mg X 1 then 500 mg every 7 days X 3 doses Indication: MRSA bacteremia/discitis Last dose around 1/7   ferrous sulfate 325 (65 FE) MG tablet Take 325 mg by mouth 2 (two) times daily.   finasteride 5 MG tablet Commonly known as: PROSCAR Take 1 tablet (5 mg total) by mouth daily.   glipiZIDE 10 MG 24 hr tablet Commonly known as: GLUCOTROL XL Take 10 mg by mouth 2 (two) times daily.   hydrALAZINE 50 MG tablet Commonly known as: APRESOLINE Take 1 tablet (50 mg total) by mouth every 8 (eight) hours. What changed:   medication strength  how much to take  when to take this   metoprolol tartrate 50 MG tablet Commonly known as: LOPRESSOR Take 1 tablet (50 mg total) by mouth 2 (two) times daily. What changed:   medication strength  how much to take    multivitamin with minerals Tabs tablet Take 1 tablet by mouth daily.   Neurontin 300 MG capsule Generic drug: gabapentin Take 300 mg by mouth 3 (three) times daily as needed.   nutrition supplement (JUVEN) Pack Take 1 packet by mouth 2 (two) times daily between meals.   Oxycodone HCl 20 MG Tabs Take 1 tablet (20 mg total) by mouth every 8 (eight) hours as needed.   PEGLOTICASE IV Inject 1 Dose into the vein every 14 (fourteen) days.   polyethylene glycol 17 g packet Commonly known as: MIRALAX / GLYCOLAX Take 17 g by mouth daily.   senna-docusate 8.6-50 MG tablet Commonly  known as: Senokot-S Take 1 tablet by mouth 2 (two) times daily.   Synthroid 50 MCG tablet Generic drug: levothyroxine Take 50 mcg by mouth daily.   tamsulosin 0.4 MG Caps capsule Commonly known as: FLOMAX Take 0.4 mg by mouth daily.   tiZANidine 2 MG tablet Commonly known as: ZANAFLEX Take 1 tablet (2 mg total) by mouth every 8 (eight) hours as needed for muscle spasms.   Vitamin B-12 5000 MCG Tbdp Take 2,500 mcg by mouth daily.   Vitamin D (Ergocalciferol) 1.25 MG (50000 UT) Caps capsule Commonly known as: DRISDOL Take 1 capsule by mouth once a week.            Home Infusion Instuctions  (From admission, onward)         Start     Ordered   09/20/19 0000  Home infusion instructions [facility] May follow [place] Dosing Protocol; May administer Cathflo as needed to maintain patency of vascular access device.; Flushing of vascular access device: per [facility] Protocol: 0.9% NaCl pre/post medica...    Question Answer Comment  Instructions May follow [place] Dosing Protocol   Instructions May administer Cathflo as needed to maintain patency of vascular access device.   Instructions Flushing of vascular access device: per [facility] Protocol: 0.9% NaCl pre/post medication administration and prn patency; Heparin 100 u/ml, 56ml for implanted ports and Heparin 10u/ml, 63ml for all other central  venous catheters.   Instructions May follow [facility] Anaphylaxis Protocol for First Dose Administration in the home: 0.9% NaCl at 25-50 ml/hr to maintain IV access for protocol meds. Epinephrine 0.3 ml IV/IM PRN and Benadryl 25-50 IV/IM PRN s/s of anaphylaxis.   Instructions [facility] Infusion Coordinator (RN) to assist per patient IV care needs in the home PRN.      09/20/19 0936   09/08/19 0000  Home infusion instructions [facility] May follow [place] Dosing Protocol; May administer Cathflo as needed to maintain patency of vascular access device.; Flushing of vascular access device: per [facility] Protocol: 0.9% NaCl pre/post medica...    Question Answer Comment  Instructions May follow [place] Dosing Protocol   Instructions May administer Cathflo as needed to maintain patency of vascular access device.   Instructions Flushing of vascular access device: per [facility] Protocol: 0.9% NaCl pre/post medication administration and prn patency; Heparin 100 u/ml, 31ml for implanted ports and Heparin 10u/ml, 80ml for all other central venous catheters.   Instructions May follow [facility] Anaphylaxis Protocol for First Dose Administration in the home: 0.9% NaCl at 25-50 ml/hr to maintain IV access for protocol meds. Epinephrine 0.3 ml IV/IM PRN and Benadryl 25-50 IV/IM PRN s/s of anaphylaxis.   Instructions [facility] Infusion Coordinator (RN) to assist per patient IV care needs in the home PRN.      09/08/19 Belcourt, [name], MD. Schedule an appointment as soon as possible for a visit in 1 week(s).   Specialty: Internal Medicine Contact information: [address] [place] [address] [phone]        [facility]             . Schedule an appointment as soon as possible for a visit in 3 week(s).   Contact information: [place] [phone]         Allergies   Allergen Reactions  . Other Other (  See Comments)    Blood pressure issues Per [facility] hospital: Pt states he can only take these pain meds or else he gets very sick: Oxycontin,morphine,demerol, and dilaudid are the only pain meds pt states he can take.    Marland Kitchen Oxycodone Other (See Comments)    Cases extreme changes in Blood pressure Cases extreme changes in Blood pressure   . Oxycodone-Acetaminophen Other (See Comments)    Blood pressure issues, rash from acetaminophen, pt states he can take tylenol though.  . Oxymorphone Other (See Comments)    Causes kidney problems  . Aspirin Nausea And Vomiting    Per [facility]  . Beta Vulgaris Other (See Comments)  . Buspirone Other (See Comments)  . Cabbage Other (See Comments)  . Codeine Other (See Comments)  . Fish Allergy Other (See Comments)  . Fish-Derived Products   . Pentazocine   . Propoxyphene Other (See Comments)  . Shellfish Allergy Other (See Comments)  . Shellfish-Derived Products   . Sulfa Antibiotics Hives  . Sulfasalazine Other (See Comments)  . Amoxicillin Rash    Per [facility]    Consultations:  Infectious disease   Procedures/Studies: CT THORACIC SPINE WO CONTRAST  Result Date: 09/17/2019 CLINICAL DATA:  Bacteremia. EXAM: CT THORACIC AND LUMBAR SPINE WITHOUT CONTRAST TECHNIQUE: Multidetector CT imaging of the thoracic and lumbar spine was performed without contrast. Multiplanar CT image reconstructions were also generated. COMPARISON:  CTA chest 09/18/2018. CT abdomen and pelvis 03/16/2018. FINDINGS: CT THORACIC SPINE FINDINGS Alignment: Grade 1 anterolisthesis of C7 on T1. Vertebrae: Mild chronic anterior wedging of the T7 and T8 vertebral bodies, unchanged. No acute fracture. New erosive changes involving the T10 inferior and T11 superior endplates with paravertebral soft tissue edema/phlegmon. Soft tissue is contiguous with the T10-11 facet joints which demonstrate new joint space widening. Limited  assessment for epidural abscess on CT. Paraspinal and other soft tissues: No paraspinal fluid collection. Small bilateral pleural effusions with mild dependent atelectasis in both lungs. Coronary atherosclerosis. Right jugular catheter terminating near the superior cavoatrial junction. Disc levels: Bilateral neural foraminal stenosis at T10-11 due to infection. Postoperative changes posteriorly at T11-12 and T12-L1. Severe disc space narrowing at C7-T1 without evidence of significant stenosis. CT LUMBAR SPINE FINDINGS Segmentation: Standard. Alignment: Moderate lumbar levoscoliosis. Trace retrolisthesis of L1 on L2 and trace anterolisthesis of L3 on L4 and L4 on L5. Vertebrae: Extensive postsurgical changes with solid posterior osseous fusion extending from T12 to the sacrum. Diffuse interbody ankylosis with bridging syndesmophytes. Mild chronic L1 compression fracture, unchanged from 2019. No acute fracture or suspicious osseous lesion. No lucency surrounding the bilateral sacral screws. Paraspinal and other soft tissues: Postoperative changes throughout the posterior lumbar soft tissues with diffuse paraspinal muscle atrophy. No paraspinal fluid collection. IVC filter. Aortic atherosclerosis. Asymmetric right renal atrophy. Partially visualized moderate bladder distension. Disc levels: Diffuse lumbar spine fusion without evidence of significant stenosis. IMPRESSION: CT THORACIC SPINE IMPRESSION 1. 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 Paravertebral soft tissue inflammation without evidence of paraspinal abscess. Limited assessment for epidural abscess by CT. 2. Small pleural effusions. CT LUMBAR SPINE IMPRESSION 1. No acute osseous abnormality identified in the lumbar spine. 2. Chronic L1 compression fracture. 3. Extensive posterior spinal fusion from T12 to the sacrum. Electronically Signed   By: Logan Bores M.D.   On: 09/17/2019 12:56   CT LUMBAR SPINE WO  CONTRAST  Result Date: 09/17/2019 CLINICAL DATA:  Bacteremia. EXAM: CT THORACIC AND LUMBAR SPINE WITHOUT CONTRAST TECHNIQUE: Multidetector CT  imaging of the thoracic and lumbar spine was performed without contrast. Multiplanar CT image reconstructions were also generated. COMPARISON:  CTA chest 09/18/2018. CT abdomen and pelvis 03/16/2018. FINDINGS: CT THORACIC SPINE FINDINGS Alignment: Grade 1 anterolisthesis of C7 on T1. Vertebrae: Mild chronic anterior wedging of the T7 and T8 vertebral bodies, unchanged. No acute fracture. New erosive changes involving the T10 inferior and T11 superior endplates with paravertebral soft tissue edema/phlegmon. Soft tissue is contiguous with the T10-11 facet joints which demonstrate new joint space widening. Limited assessment for epidural abscess on CT. Paraspinal and other soft tissues: No paraspinal fluid collection. Small bilateral pleural effusions with mild dependent atelectasis in both lungs. Coronary atherosclerosis. Right jugular catheter terminating near the superior cavoatrial junction. Disc levels: Bilateral neural foraminal stenosis at T10-11 due to infection. Postoperative changes posteriorly at T11-12 and T12-L1. Severe disc space narrowing at C7-T1 without evidence of significant stenosis. CT LUMBAR SPINE FINDINGS Segmentation: Standard. Alignment: Moderate lumbar levoscoliosis. Trace retrolisthesis of L1 on L2 and trace anterolisthesis of L3 on L4 and L4 on L5. Vertebrae: Extensive postsurgical changes with solid posterior osseous fusion extending from T12 to the sacrum. Diffuse interbody ankylosis with bridging syndesmophytes. Mild chronic L1 compression fracture, unchanged from 2019. No acute fracture or suspicious osseous lesion. No lucency surrounding the bilateral sacral screws. Paraspinal and other soft tissues: Postoperative changes throughout the posterior lumbar soft tissues with diffuse paraspinal muscle atrophy. No paraspinal fluid collection. IVC  filter. Aortic atherosclerosis. Asymmetric right renal atrophy. Partially visualized moderate bladder distension. Disc levels: Diffuse lumbar spine fusion without evidence of significant stenosis. IMPRESSION: CT THORACIC SPINE IMPRESSION 1. 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 Paravertebral soft tissue inflammation without evidence of paraspinal abscess. Limited assessment for epidural abscess by CT. 2. Small pleural effusions. CT LUMBAR SPINE IMPRESSION 1. No acute osseous abnormality identified in the lumbar spine. 2. Chronic L1 compression fracture. 3. Extensive posterior spinal fusion from T12 to the sacrum. Electronically Signed   By: Logan Bores M.D.   On: 09/17/2019 12:56   US RENAL  Result Date: 09/22/2019 CLINICAL DATA:  Acute kidney injury. EXAM: RENAL / URINARY TRACT ULTRASOUND COMPLETE COMPARISON:  CT, 08/26/2019. FINDINGS: Right Kidney: Renal measurements: 10.5 x 4.1 x 4.3 cm = volume: 94.9 mL. Borderline increased parenchymal echogenicity. Diffuse renal cortical thinning. No mass, stone or hydronephrosis. Left Kidney: Renal measurements: 14.9 x 5.8 x 5.5 cm = volume: 249.9 mL. Borderline increased renal parenchymal echogenicity. No renal mass or stone. No hydronephrosis. Bladder: Dependent debris.  No wall thickening or masses. Other: None. IMPRESSION: 1. Right renal atrophy. Borderline increased renal parenchymal echogenicity bilaterally suggesting medical renal disease. 2. No acute findings.  No hydronephrosis. 3. Dependent debris in the bladder, but no wall thickening. Electronically Signed   By: Lajean Manes M.D.   On: 09/22/2019 09:13   DG Abd 2 Views  Result Date: 09/26/2019 CLINICAL DATA:  Abdominal pain EXAM: ABDOMEN - 2 VIEW COMPARISON:  CT 08/26/2019 FINDINGS: Large amount of gas and fecal matter throughout the colon. Large amount of small bowel gas but no dilated loops. No sign of free air. IVC filter in place. Previous lumbar fusion.  IMPRESSION: Large amount of gas and stool throughout the colon. Large amount of small bowel gas without dilated loops. Most consistent with ileus and or constipation. Pattern not particularly suggestive of frank obstruction. Findings could be associated with abdominal discomfort. Electronically Signed   By: Nelson Chimes M.D.   On: 09/26/2019 12:48  Subjective: No new complaints.   Discharge Exam: Vitals:   10/08/19 2107 10/09/19 0500  BP: 122/80 126/76  Pulse: 60 66  Resp: 17 18  Temp: 98 F (36.7 C) 98.1 F (36.7 C)  SpO2: 99% 98%   Vitals:   10/08/19 0523 10/08/19 1433 10/08/19 2107 10/09/19 0500  BP: 135/82 127/71 122/80 126/76  Pulse: 62 (!) 57 60 66  Resp: 16 18 17 18   Temp: 98.2 F (36.8 C) 97.9 F (36.6 C) 98 F (36.7 C) 98.1 F (36.7 C)  TempSrc: Oral Oral Oral Oral  SpO2: 98% 98% 99% 98%  Weight: 112.1 kg     Height:        General: Pt is alert, awake, not in acute distress Cardiovascular: RRR, S1/S2 +, no rubs, no gallops Respiratory: CTA bilaterally, no wheezing, no rhonchi Abdominal: Soft, NT, ND, bowel sounds + Extremities: no edema, no cyanosis    The results of significant diagnostics from this hospitalization (including imaging, microbiology, ancillary and laboratory) are listed below for reference.     Microbiology: Recent Results (from the past 240 hour(s))  Culture, Urine     Status: Abnormal   Collection Time: 10/05/19 10:44 PM   Specimen: Urine, Clean Catch  Result Value Ref Range Status   Specimen Description URINE, CLEAN CATCH  Final   Special Requests NONE  Final   Culture (A)  Final    <10,000 COLONIES/mL INSIGNIFICANT GROWTH Performed at [facility], [address], [place], [place] [address]    Report Status 10/06/2019 FINAL  Final     Labs: BNP (last 3 results) No results for input(s): BNP in the last 8760 hours. Basic Metabolic Panel: Recent Labs  Lab 10/04/19 0442 10/05/19 0236 10/08/19 0333  NA 136 135  --    K 4.6 4.8  --   CL 103 102  --   CO2 24 23  --   GLUCOSE 154* 204*  --   BUN 37* 40*  --   CREATININE 3.04* 2.96* 2.80*  CALCIUM 8.7* 8.8*  --    Liver Function Tests: No results for input(s): AST, ALT, ALKPHOS, BILITOT, PROT, ALBUMIN in the last 168 hours. No results for input(s): LIPASE, AMYLASE in the last 168 hours. No results for input(s): AMMONIA in the last 168 hours. CBC: Recent Labs  Lab 10/04/19 0442 10/05/19 0236  WBC 5.8 6.0  HGB 7.9* 7.8*  HCT 25.0* 23.7*  MCV 96.9 95.2  PLT 200 191   Cardiac Enzymes: Recent Labs  Lab 10/06/19 0330  CKTOTAL 226   BNP: Invalid input(s): POCBNP CBG: Recent Labs  Lab 10/08/19 1200 10/08/19 1619 10/08/19 2106 10/09/19 0728 10/09/19 1207  GLUCAP 118* 139* 165* 144* 116*   D-Dimer No results for input(s): DDIMER in the last 72 hours. Hgb A1c No results for input(s): HGBA1C in the last 72 hours. Lipid Profile No results for input(s): CHOL, HDL, LDLCALC, TRIG, CHOLHDL, LDLDIRECT in the last 72 hours. Thyroid function studies No results for input(s): TSH, T4TOTAL, T3FREE, THYROIDAB in the last 72 hours.  Invalid input(s): FREET3 Anemia work up No results for input(s): VITAMINB12, FOLATE, FERRITIN, TIBC, IRON, RETICCTPCT in the last 72 hours. Urinalysis    Component Value Date/Time   COLORURINE STRAW (A) 10/01/2019 1114   APPEARANCEUR CLEAR 10/01/2019 1114   LABSPEC 1.010 10/01/2019 1114   PHURINE 7.0 10/01/2019 1114   [place] 10/01/2019 1114   HGBUR NEGATIVE 10/01/2019 1114   BILIRUBINUR NEGATIVE 10/01/2019 1114   KETONESUR NEGATIVE  10/01/2019 1114   PROTEINUR 100 (A) 10/01/2019 1114   NITRITE NEGATIVE 10/01/2019 1114   LEUKOCYTESUR LARGE (A) 10/01/2019 1114   Sepsis Labs Invalid input(s): PROCALCITONIN,  WBC,  LACTICIDVEN Microbiology Recent Results (from the past 240 hour(s))  Culture, Urine     Status: Abnormal   Collection Time: 10/05/19 10:44 PM   Specimen: Urine, Clean Catch  Result  Value Ref Range Status   Specimen Description URINE, CLEAN CATCH  Final   Special Requests NONE  Final   Culture (A)  Final    <10,000 COLONIES/mL INSIGNIFICANT GROWTH Performed at [facility], [place] [address], [place], [place] [address]    Report Status 10/06/2019 FINAL  Final     Time coordinating discharge: 32  minutes  SIGNED:   Hosie Poisson, MD  [facility]

## 2019-10-25 ENCOUNTER — Telehealth: Payer: Self-pay

## 2019-10-25 NOTE — Telephone Encounter (Signed)
Please put a prescription pad on my computer tomorrow for me to write out the Rx for [place] to fax for wheelchair. Also I haven't seen the patient in a long time for any wounds on the left and since I did not order the home nursing I can not make any recommendations/they must get the referring provider to do this or the wound care center to give instruction for wound care. Thanks Dr. Cannon Kettle

## 2019-10-25 NOTE — Telephone Encounter (Signed)
Thomas [name] from [facility] called stating Thomas [name] LT lower extremity is very swollen, draining and red. Thomas [name] also states the Pt has 3 new ulcers at his Lt foot.  Thomas [name] wants [place] to fax her a Rx for a wheelchair since the Pt is having difficulty walking. Therefore, we will call the Pt to set up an appointment with [place]

## 2019-10-27 ENCOUNTER — Telehealth: Payer: Self-pay

## 2019-10-27 NOTE — Telephone Encounter (Signed)
Called and spoke  With Pt about the Dr's advice to contact his referring Dr or wound center for his new wounds at his Lt leg. Pt states he did not have any referring Dr. For Home Care but will make an appointment at the wound center, I advice the Pt to contact us back again if any issues.

## 2019-10-27 NOTE — Telephone Encounter (Signed)
LVm to Thomas [name] about the Dr's wound instructions, I will try to reach the Pt as well do inform him of what he should do about his new wounds.

## 2019-11-03 ENCOUNTER — Telehealth: Payer: Self-pay | Admitting: *Deleted

## 2019-11-03 NOTE — Telephone Encounter (Signed)
[facility] states they received an order for pt's wheelchair, but the notes that were included were from 2019 and are required to be within a year.

## 2019-11-04 NOTE — Telephone Encounter (Signed)
Called the Pt. Back, but mail box is full. Per Dr. Cannon Kettle patient needs to see the doctor who did his amputation for the wheelchair Rx.  Dr. Lovie Macadamia would be the doctor who can do the prescription.  Dr. Cannon Kettle sent three wheel chair prescriptions to [place] Patient with different medical notes with no success.

## 2019-11-11 NOTE — Telephone Encounter (Signed)
Thomas [name], we've tried several times to send new clinicals and demographics with no success. Per Dr. Cannon Kettle Pt has to get Rx from the Dr that did his amputation (Dr. Lovie Macadamia) since [place] was 1 year ago

## 2019-11-11 NOTE — Telephone Encounter (Signed)
[facility] states they received a rx and need current clinicals and demographics.

## 2019-11-20 DIAGNOSIS — E119 Type 2 diabetes mellitus without complications: Secondary | ICD-10-CM | POA: Diagnosis not present

## 2019-11-20 DIAGNOSIS — I160 Hypertensive urgency: Secondary | ICD-10-CM

## 2019-11-21 DIAGNOSIS — I160 Hypertensive urgency: Secondary | ICD-10-CM | POA: Diagnosis not present

## 2019-11-21 DIAGNOSIS — E119 Type 2 diabetes mellitus without complications: Secondary | ICD-10-CM | POA: Diagnosis not present

## 2019-11-23 DIAGNOSIS — E119 Type 2 diabetes mellitus without complications: Secondary | ICD-10-CM | POA: Diagnosis not present

## 2019-11-23 DIAGNOSIS — I160 Hypertensive urgency: Secondary | ICD-10-CM

## 2019-11-24 ENCOUNTER — Inpatient Hospital Stay (HOSPITAL_COMMUNITY): Payer: Medicare Other

## 2019-11-24 ENCOUNTER — Encounter (HOSPITAL_COMMUNITY): Payer: Self-pay | Admitting: Internal Medicine

## 2019-11-24 ENCOUNTER — Inpatient Hospital Stay (HOSPITAL_COMMUNITY)
Admission: AD | Admit: 2019-11-24 | Discharge: 2019-12-17 | DRG: 638 | Disposition: A | Discharge status: Skilled Nursing Facility | Payer: Medicare Other | Source: Other Acute Inpatient Hospital | Attending: Internal Medicine | Admitting: Internal Medicine

## 2019-11-24 ENCOUNTER — Encounter (HOSPITAL_COMMUNITY): Payer: Medicare Other

## 2019-11-24 DIAGNOSIS — N179 Acute kidney failure, unspecified: Secondary | ICD-10-CM | POA: Diagnosis not present

## 2019-11-24 DIAGNOSIS — N138 Other obstructive and reflux uropathy: Secondary | ICD-10-CM | POA: Diagnosis not present

## 2019-11-24 DIAGNOSIS — E1140 Type 2 diabetes mellitus with diabetic neuropathy, unspecified: Secondary | ICD-10-CM | POA: Diagnosis not present

## 2019-11-24 DIAGNOSIS — L89626 Pressure-induced deep tissue damage of left heel: Secondary | ICD-10-CM | POA: Diagnosis not present

## 2019-11-24 DIAGNOSIS — M1A9XX0 Chronic gout, unspecified, without tophus (tophi): Secondary | ICD-10-CM | POA: Diagnosis present

## 2019-11-24 DIAGNOSIS — M4620 Osteomyelitis of vertebra, site unspecified: Secondary | ICD-10-CM | POA: Diagnosis not present

## 2019-11-24 DIAGNOSIS — E1169 Type 2 diabetes mellitus with other specified complication: Secondary | ICD-10-CM | POA: Diagnosis not present

## 2019-11-24 DIAGNOSIS — Z9181 History of falling: Secondary | ICD-10-CM

## 2019-11-24 DIAGNOSIS — G894 Chronic pain syndrome: Secondary | ICD-10-CM | POA: Diagnosis present

## 2019-11-24 DIAGNOSIS — Z9119 Patient's noncompliance with other medical treatment and regimen: Secondary | ICD-10-CM | POA: Diagnosis not present

## 2019-11-24 DIAGNOSIS — L8990 Pressure ulcer of unspecified site, unspecified stage: Secondary | ICD-10-CM | POA: Diagnosis present

## 2019-11-24 DIAGNOSIS — R338 Other retention of urine: Secondary | ICD-10-CM | POA: Diagnosis not present

## 2019-11-24 DIAGNOSIS — E11610 Type 2 diabetes mellitus with diabetic neuropathic arthropathy: Secondary | ICD-10-CM | POA: Diagnosis present

## 2019-11-24 DIAGNOSIS — E039 Hypothyroidism, unspecified: Secondary | ICD-10-CM | POA: Diagnosis present

## 2019-11-24 DIAGNOSIS — Z89511 Acquired absence of right leg below knee: Secondary | ICD-10-CM | POA: Diagnosis not present

## 2019-11-24 DIAGNOSIS — L89620 Pressure ulcer of left heel, unstageable: Secondary | ICD-10-CM | POA: Diagnosis present

## 2019-11-24 DIAGNOSIS — K920 Hematemesis: Secondary | ICD-10-CM | POA: Diagnosis present

## 2019-11-24 DIAGNOSIS — Z7984 Long term (current) use of oral hypoglycemic drugs: Secondary | ICD-10-CM

## 2019-11-24 DIAGNOSIS — I129 Hypertensive chronic kidney disease with stage 1 through stage 4 chronic kidney disease, or unspecified chronic kidney disease: Secondary | ICD-10-CM | POA: Diagnosis present

## 2019-11-24 DIAGNOSIS — Z20822 Contact with and (suspected) exposure to covid-19: Secondary | ICD-10-CM | POA: Diagnosis not present

## 2019-11-24 DIAGNOSIS — K2100 Gastro-esophageal reflux disease with esophagitis, without bleeding: Secondary | ICD-10-CM | POA: Diagnosis present

## 2019-11-24 DIAGNOSIS — M4644 Discitis, unspecified, thoracic region: Secondary | ICD-10-CM | POA: Diagnosis not present

## 2019-11-24 DIAGNOSIS — E119 Type 2 diabetes mellitus without complications: Secondary | ICD-10-CM

## 2019-11-24 DIAGNOSIS — Z885 Allergy status to narcotic agent status: Secondary | ICD-10-CM

## 2019-11-24 DIAGNOSIS — Z888 Allergy status to other drugs, medicaments and biological substances status: Secondary | ICD-10-CM | POA: Diagnosis not present

## 2019-11-24 DIAGNOSIS — M1990 Unspecified osteoarthritis, unspecified site: Secondary | ICD-10-CM | POA: Diagnosis present

## 2019-11-24 DIAGNOSIS — G062 Extradural and subdural abscess, unspecified: Secondary | ICD-10-CM | POA: Diagnosis present

## 2019-11-24 DIAGNOSIS — N401 Enlarged prostate with lower urinary tract symptoms: Secondary | ICD-10-CM | POA: Diagnosis not present

## 2019-11-24 DIAGNOSIS — K922 Gastrointestinal hemorrhage, unspecified: Secondary | ICD-10-CM | POA: Diagnosis not present

## 2019-11-24 DIAGNOSIS — M1A09X0 Idiopathic chronic gout, multiple sites, without tophus (tophi): Secondary | ICD-10-CM | POA: Diagnosis not present

## 2019-11-24 DIAGNOSIS — M4640 Discitis, unspecified, site unspecified: Secondary | ICD-10-CM | POA: Diagnosis not present

## 2019-11-24 DIAGNOSIS — E875 Hyperkalemia: Secondary | ICD-10-CM | POA: Diagnosis not present

## 2019-11-24 DIAGNOSIS — Z79899 Other long term (current) drug therapy: Secondary | ICD-10-CM

## 2019-11-24 DIAGNOSIS — R339 Retention of urine, unspecified: Secondary | ICD-10-CM | POA: Diagnosis not present

## 2019-11-24 DIAGNOSIS — Z89611 Acquired absence of right leg above knee: Secondary | ICD-10-CM

## 2019-11-24 DIAGNOSIS — E1151 Type 2 diabetes mellitus with diabetic peripheral angiopathy without gangrene: Secondary | ICD-10-CM | POA: Diagnosis present

## 2019-11-24 DIAGNOSIS — Z91013 Allergy to seafood: Secondary | ICD-10-CM

## 2019-11-24 DIAGNOSIS — D631 Anemia in chronic kidney disease: Secondary | ICD-10-CM | POA: Diagnosis present

## 2019-11-24 DIAGNOSIS — R5381 Other malaise: Secondary | ICD-10-CM | POA: Diagnosis present

## 2019-11-24 DIAGNOSIS — I10 Essential (primary) hypertension: Secondary | ICD-10-CM | POA: Diagnosis present

## 2019-11-24 DIAGNOSIS — I160 Hypertensive urgency: Secondary | ICD-10-CM | POA: Diagnosis not present

## 2019-11-24 DIAGNOSIS — E1122 Type 2 diabetes mellitus with diabetic chronic kidney disease: Secondary | ICD-10-CM

## 2019-11-24 DIAGNOSIS — Z8673 Personal history of transient ischemic attack (TIA), and cerebral infarction without residual deficits: Secondary | ICD-10-CM

## 2019-11-24 DIAGNOSIS — Z794 Long term (current) use of insulin: Secondary | ICD-10-CM | POA: Diagnosis not present

## 2019-11-24 DIAGNOSIS — Z8739 Personal history of other diseases of the musculoskeletal system and connective tissue: Secondary | ICD-10-CM | POA: Diagnosis not present

## 2019-11-24 DIAGNOSIS — E785 Hyperlipidemia, unspecified: Secondary | ICD-10-CM | POA: Diagnosis present

## 2019-11-24 DIAGNOSIS — N400 Enlarged prostate without lower urinary tract symptoms: Secondary | ICD-10-CM | POA: Diagnosis not present

## 2019-11-24 DIAGNOSIS — Z8614 Personal history of Methicillin resistant Staphylococcus aureus infection: Secondary | ICD-10-CM | POA: Diagnosis not present

## 2019-11-24 DIAGNOSIS — E869 Volume depletion, unspecified: Secondary | ICD-10-CM | POA: Diagnosis not present

## 2019-11-24 DIAGNOSIS — L0390 Cellulitis, unspecified: Secondary | ICD-10-CM | POA: Diagnosis not present

## 2019-11-24 DIAGNOSIS — R238 Other skin changes: Secondary | ICD-10-CM

## 2019-11-24 DIAGNOSIS — N184 Chronic kidney disease, stage 4 (severe): Secondary | ICD-10-CM | POA: Diagnosis present

## 2019-11-24 DIAGNOSIS — M549 Dorsalgia, unspecified: Secondary | ICD-10-CM | POA: Diagnosis not present

## 2019-11-24 DIAGNOSIS — Z881 Allergy status to other antibiotic agents status: Secondary | ICD-10-CM

## 2019-11-24 DIAGNOSIS — R3989 Other symptoms and signs involving the genitourinary system: Secondary | ICD-10-CM | POA: Diagnosis not present

## 2019-11-24 DIAGNOSIS — Z91018 Allergy to other foods: Secondary | ICD-10-CM | POA: Diagnosis not present

## 2019-11-24 DIAGNOSIS — N390 Urinary tract infection, site not specified: Secondary | ICD-10-CM | POA: Diagnosis not present

## 2019-11-24 DIAGNOSIS — G9520 Unspecified cord compression: Secondary | ICD-10-CM | POA: Diagnosis not present

## 2019-11-24 DIAGNOSIS — M4624 Osteomyelitis of vertebra, thoracic region: Secondary | ICD-10-CM | POA: Diagnosis not present

## 2019-11-24 DIAGNOSIS — Z886 Allergy status to analgesic agent status: Secondary | ICD-10-CM | POA: Diagnosis not present

## 2019-11-24 DIAGNOSIS — M109 Gout, unspecified: Secondary | ICD-10-CM | POA: Diagnosis present

## 2019-11-24 DIAGNOSIS — B9562 Methicillin resistant Staphylococcus aureus infection as the cause of diseases classified elsewhere: Secondary | ICD-10-CM | POA: Diagnosis present

## 2019-11-24 DIAGNOSIS — Z882 Allergy status to sulfonamides status: Secondary | ICD-10-CM

## 2019-11-24 DIAGNOSIS — Z79890 Hormone replacement therapy (postmenopausal): Secondary | ICD-10-CM

## 2019-11-24 DIAGNOSIS — Z872 Personal history of diseases of the skin and subcutaneous tissue: Secondary | ICD-10-CM | POA: Diagnosis not present

## 2019-11-24 DIAGNOSIS — G061 Intraspinal abscess and granuloma: Secondary | ICD-10-CM | POA: Diagnosis not present

## 2019-11-24 HISTORY — DX: Chronic pain syndrome: G89.4

## 2019-11-24 HISTORY — DX: Type 2 diabetes mellitus without complications: E11.9

## 2019-11-24 HISTORY — DX: Other malaise: R53.81

## 2019-11-24 HISTORY — DX: Benign prostatic hyperplasia without lower urinary tract symptoms: N40.0

## 2019-11-24 HISTORY — DX: Chronic kidney disease, stage 4 (severe): N18.4

## 2019-11-24 LAB — CBC WITH DIFFERENTIAL/PLATELET
Abs Immature Granulocytes: 0.06 10*3/uL (ref 0.00–0.07)
Abs Immature Granulocytes: 0.06 10*3/uL (ref 0.00–0.07)
Basophils Absolute: 0 10*3/uL (ref 0.0–0.1)
Basophils Absolute: 0 10*3/uL (ref 0.0–0.1)
Basophils Relative: 0 %
Basophils Relative: 0 %
Eosinophils Absolute: 0 10*3/uL (ref 0.0–0.5)
Eosinophils Absolute: 0 10*3/uL (ref 0.0–0.5)
Eosinophils Relative: 0 %
Eosinophils Relative: 0 %
HCT: 34.8 % — ABNORMAL LOW (ref 39.0–52.0)
HCT: 35.1 % — ABNORMAL LOW (ref 39.0–52.0)
Hemoglobin: 10.8 g/dL — ABNORMAL LOW (ref 13.0–17.0)
Hemoglobin: 10.9 g/dL — ABNORMAL LOW (ref 13.0–17.0)
Immature Granulocytes: 1 %
Immature Granulocytes: 1 %
Lymphocytes Relative: 5 %
Lymphocytes Relative: 7 %
Lymphs Abs: 0.5 10*3/uL — ABNORMAL LOW (ref 0.7–4.0)
Lymphs Abs: 0.6 10*3/uL — ABNORMAL LOW (ref 0.7–4.0)
MCH: 31.3 pg (ref 26.0–34.0)
MCH: 31.1 pg (ref 26.0–34.0)
MCHC: 31 g/dL (ref 30.0–36.0)
MCHC: 31.1 g/dL (ref 30.0–36.0)
MCV: 100.9 fL — ABNORMAL HIGH (ref 80.0–100.0)
MCV: 100 fL (ref 80.0–100.0)
Monocytes Absolute: 0.3 10*3/uL (ref 0.1–1.0)
Monocytes Absolute: 0.4 10*3/uL (ref 0.1–1.0)
Monocytes Relative: 3 %
Monocytes Relative: 4 %
Neutro Abs: 9.2 10*3/uL — ABNORMAL HIGH (ref 1.7–7.7)
Neutro Abs: 7.4 10*3/uL (ref 1.7–7.7)
Neutrophils Relative %: 91 %
Neutrophils Relative %: 88 %
Platelets: 195 10*3/uL (ref 150–400)
Platelets: 202 10*3/uL (ref 150–400)
RBC: 3.45 MIL/uL — ABNORMAL LOW (ref 4.22–5.81)
RBC: 3.51 MIL/uL — ABNORMAL LOW (ref 4.22–5.81)
RDW: 15.2 % (ref 11.5–15.5)
RDW: 15.3 % (ref 11.5–15.5)
WBC: 10.1 10*3/uL (ref 4.0–10.5)
WBC: 8.5 10*3/uL (ref 4.0–10.5)
nRBC: 0 % (ref 0.0–0.2)
nRBC: 0 % (ref 0.0–0.2)

## 2019-11-24 LAB — GLUCOSE, CAPILLARY
Glucose-Capillary: 163 mg/dL — ABNORMAL HIGH (ref 70–99)
Glucose-Capillary: 157 mg/dL — ABNORMAL HIGH (ref 70–99)

## 2019-11-24 LAB — HEMOGLOBIN A1C
Hgb A1c MFr Bld: 5.7 % — ABNORMAL HIGH (ref 4.8–5.6)
Mean Plasma Glucose: 116.89 mg/dL

## 2019-11-24 LAB — SEDIMENTATION RATE: Sed Rate: 132 mm/hr — ABNORMAL HIGH (ref 0–16)

## 2019-11-24 LAB — COMPREHENSIVE METABOLIC PANEL
ALT: 13 U/L (ref 0–44)
AST: 27 U/L (ref 15–41)
Albumin: 3.2 g/dL — ABNORMAL LOW (ref 3.5–5.0)
Alkaline Phosphatase: 75 U/L (ref 38–126)
Anion gap: 15 (ref 5–15)
BUN: 42 mg/dL — ABNORMAL HIGH (ref 8–23)
CO2: 24 mmol/L (ref 22–32)
Calcium: 9.1 mg/dL (ref 8.9–10.3)
Chloride: 102 mmol/L (ref 98–111)
Creatinine, Ser: 2.73 mg/dL — ABNORMAL HIGH (ref 0.61–1.24)
GFR calc Af Amer: 27 mL/min — ABNORMAL LOW (ref >60)
GFR calc non Af Amer: 23 mL/min — ABNORMAL LOW (ref >60)
Glucose, Bld: 198 mg/dL — ABNORMAL HIGH (ref 70–99)
Potassium: 4.1 mmol/L (ref 3.5–5.1)
Sodium: 141 mmol/L (ref 135–145)
Total Bilirubin: 0.7 mg/dL (ref 0.3–1.2)
Total Protein: 8.1 g/dL (ref 6.5–8.1)

## 2019-11-24 LAB — PREALBUMIN: Prealbumin: 23.8 mg/dL (ref 18–38)

## 2019-11-24 LAB — TYPE AND SCREEN
ABO/RH(D): A POS
Antibody Screen: NEGATIVE

## 2019-11-24 LAB — PROTIME-INR
INR: 1 (ref 0.8–1.2)
Prothrombin Time: 13.5 seconds (ref 11.4–15.2)

## 2019-11-24 LAB — ABO/RH: ABO/RH(D): A POS

## 2019-11-24 LAB — MRSA PCR SCREENING: MRSA by PCR: NEGATIVE

## 2019-11-24 LAB — HIV ANTIBODY (ROUTINE TESTING W REFLEX): HIV Screen 4th Generation wRfx: NONREACTIVE

## 2019-11-24 LAB — C-REACTIVE PROTEIN: CRP: 8.3 mg/dL — ABNORMAL HIGH (ref <1.0)

## 2019-11-24 MED ORDER — ONDANSETRON HCL 4 MG PO TABS
4.0000 mg | ORAL_TABLET | Freq: Four times a day (QID) | ORAL | Status: DC | PRN
  Administered 2019-11-24 – 2019-12-08 (×2): 4 mg via ORAL
  Filled 2019-11-24 (×2): qty 1

## 2019-11-24 MED ORDER — INSULIN ASPART 100 UNIT/ML ~~LOC~~ SOLN
0.0000 [IU] | Freq: Every day | SUBCUTANEOUS | Status: DC
  Administered 2019-12-02 – 2019-12-05 (×2): 2 [IU] via SUBCUTANEOUS

## 2019-11-24 MED ORDER — LEVOTHYROXINE SODIUM 50 MCG PO TABS
50.0000 ug | ORAL_TABLET | Freq: Every day | ORAL | Status: DC
  Administered 2019-11-25 – 2019-12-17 (×23): 50 ug via ORAL
  Filled 2019-11-24 (×23): qty 1

## 2019-11-24 MED ORDER — SODIUM CHLORIDE 0.9 % IV SOLN: INTRAVENOUS | Status: DC

## 2019-11-24 MED ORDER — BOOST / RESOURCE BREEZE PO LIQD CUSTOM
1.0000 | Freq: Two times a day (BID) | ORAL | Status: DC
  Administered 2019-11-28 – 2019-12-01 (×7): 1 via ORAL

## 2019-11-24 MED ORDER — ONDANSETRON HCL 4 MG/2ML IJ SOLN
4.0000 mg | Freq: Four times a day (QID) | INTRAMUSCULAR | Status: DC | PRN
  Administered 2019-11-25 – 2019-12-17 (×8): 4 mg via INTRAVENOUS
  Filled 2019-11-24 (×9): qty 2

## 2019-11-24 MED ORDER — LABETALOL HCL 5 MG/ML IV SOLN
10.0000 mg | INTRAVENOUS | Status: DC | PRN
  Administered 2019-11-24 (×2): 10 mg via INTRAVENOUS
  Filled 2019-11-24 (×2): qty 4

## 2019-11-24 MED ORDER — ACETAMINOPHEN 325 MG PO TABS
650.0000 mg | ORAL_TABLET | Freq: Four times a day (QID) | ORAL | Status: DC | PRN
  Administered 2019-11-25: 650 mg via ORAL
  Filled 2019-11-24: qty 2

## 2019-11-24 MED ORDER — LACTATED RINGERS IV SOLN: INTRAVENOUS | Status: DC

## 2019-11-24 MED ORDER — OXYCODONE HCL 5 MG PO TABS
20.0000 mg | ORAL_TABLET | Freq: Three times a day (TID) | ORAL | Status: DC | PRN
  Filled 2019-11-24: qty 4

## 2019-11-24 MED ORDER — TAMSULOSIN HCL 0.4 MG PO CAPS
0.4000 mg | ORAL_CAPSULE | Freq: Every day | ORAL | Status: DC
  Administered 2019-11-24 – 2019-12-17 (×22): 0.4 mg via ORAL
  Filled 2019-11-24 (×22): qty 1

## 2019-11-24 MED ORDER — SODIUM CHLORIDE 0.9% FLUSH
3.0000 mL | Freq: Two times a day (BID) | INTRAVENOUS | Status: DC
  Administered 2019-11-24 – 2019-12-11 (×23): 3 mL via INTRAVENOUS
  Administered 2019-12-13: 10 mL via INTRAVENOUS
  Administered 2019-12-16 – 2019-12-17 (×2): 3 mL via INTRAVENOUS

## 2019-11-24 MED ORDER — INSULIN ASPART 100 UNIT/ML ~~LOC~~ SOLN
0.0000 [IU] | Freq: Three times a day (TID) | SUBCUTANEOUS | Status: DC
  Administered 2019-11-24: 3 [IU] via SUBCUTANEOUS
  Administered 2019-11-26: 2 [IU] via SUBCUTANEOUS
  Administered 2019-11-26: 1 [IU] via SUBCUTANEOUS
  Administered 2019-11-27 (×3): 2 [IU] via SUBCUTANEOUS
  Administered 2019-11-28 (×2): 3 [IU] via SUBCUTANEOUS
  Administered 2019-11-29: 2 [IU] via SUBCUTANEOUS
  Administered 2019-11-29: 5 [IU] via SUBCUTANEOUS
  Administered 2019-11-29: 2 [IU] via SUBCUTANEOUS
  Administered 2019-11-30: 3 [IU] via SUBCUTANEOUS
  Administered 2019-11-30: 2 [IU] via SUBCUTANEOUS
  Administered 2019-11-30: 5 [IU] via SUBCUTANEOUS
  Administered 2019-12-01: 3 [IU] via SUBCUTANEOUS
  Administered 2019-12-01 – 2019-12-02 (×3): 2 [IU] via SUBCUTANEOUS
  Administered 2019-12-02: 3 [IU] via SUBCUTANEOUS
  Administered 2019-12-02: 2 [IU] via SUBCUTANEOUS
  Administered 2019-12-03 – 2019-12-04 (×5): 3 [IU] via SUBCUTANEOUS
  Administered 2019-12-04 – 2019-12-05 (×3): 2 [IU] via SUBCUTANEOUS
  Administered 2019-12-05 – 2019-12-06 (×2): 3 [IU] via SUBCUTANEOUS
  Administered 2019-12-06: 2 [IU] via SUBCUTANEOUS
  Administered 2019-12-06: 3 [IU] via SUBCUTANEOUS
  Administered 2019-12-07: 2 [IU] via SUBCUTANEOUS
  Administered 2019-12-07: 3 [IU] via SUBCUTANEOUS
  Administered 2019-12-07: 2 [IU] via SUBCUTANEOUS
  Administered 2019-12-08: 3 [IU] via SUBCUTANEOUS
  Administered 2019-12-08 – 2019-12-10 (×6): 2 [IU] via SUBCUTANEOUS
  Administered 2019-12-10 (×2): 3 [IU] via SUBCUTANEOUS
  Administered 2019-12-11 (×3): 2 [IU] via SUBCUTANEOUS
  Administered 2019-12-12: 3 [IU] via SUBCUTANEOUS
  Administered 2019-12-12: 2 [IU] via SUBCUTANEOUS
  Administered 2019-12-12: 3 [IU] via SUBCUTANEOUS
  Administered 2019-12-13: 2 [IU] via SUBCUTANEOUS
  Administered 2019-12-13 – 2019-12-15 (×8): 3 [IU] via SUBCUTANEOUS
  Administered 2019-12-16: 2 [IU] via SUBCUTANEOUS
  Administered 2019-12-16: 3 [IU] via SUBCUTANEOUS
  Administered 2019-12-16: 2 [IU] via SUBCUTANEOUS
  Administered 2019-12-17 (×2): 3 [IU] via SUBCUTANEOUS

## 2019-11-24 MED ORDER — FINASTERIDE 5 MG PO TABS
5.0000 mg | ORAL_TABLET | Freq: Every day | ORAL | Status: DC
  Administered 2019-11-25 – 2019-12-17 (×22): 5 mg via ORAL
  Filled 2019-11-24 (×22): qty 1

## 2019-11-24 MED ORDER — HYDRALAZINE HCL 50 MG PO TABS
50.0000 mg | ORAL_TABLET | Freq: Three times a day (TID) | ORAL | Status: DC
  Administered 2019-11-24 – 2019-12-17 (×67): 50 mg via ORAL
  Filled 2019-11-24 (×67): qty 1

## 2019-11-24 MED ORDER — ACETAMINOPHEN 650 MG RE SUPP: 650.0000 mg | Freq: Four times a day (QID) | RECTAL | Status: DC | PRN

## 2019-11-24 MED ORDER — ALLOPURINOL 100 MG PO TABS
100.0000 mg | ORAL_TABLET | Freq: Two times a day (BID) | ORAL | Status: DC
  Administered 2019-11-24 – 2019-12-17 (×45): 100 mg via ORAL
  Filled 2019-11-24 (×45): qty 1

## 2019-11-24 MED ORDER — METOPROLOL TARTRATE 50 MG PO TABS
50.0000 mg | ORAL_TABLET | Freq: Two times a day (BID) | ORAL | Status: DC
  Administered 2019-11-24 – 2019-12-17 (×42): 50 mg via ORAL
  Filled 2019-11-24 (×19): qty 1
  Filled 2019-11-24: qty 2
  Filled 2019-11-24 (×24): qty 1

## 2019-11-24 MED ORDER — CLONIDINE HCL 0.3 MG PO TABS
0.3000 mg | ORAL_TABLET | Freq: Three times a day (TID) | ORAL | Status: DC
  Administered 2019-11-24 – 2019-12-17 (×69): 0.3 mg via ORAL
  Filled 2019-11-24 (×30): qty 1
  Filled 2019-11-24: qty 3
  Filled 2019-11-24 (×38): qty 1

## 2019-11-24 MED ORDER — TIZANIDINE HCL 2 MG PO TABS
2.0000 mg | ORAL_TABLET | Freq: Three times a day (TID) | ORAL | Status: DC | PRN
  Administered 2019-11-25 – 2019-12-16 (×12): 2 mg via ORAL
  Filled 2019-11-24 (×14): qty 1

## 2019-11-24 MED ORDER — AMLODIPINE BESYLATE 10 MG PO TABS
10.0000 mg | ORAL_TABLET | Freq: Every day | ORAL | Status: DC
  Administered 2019-11-25 – 2019-12-17 (×22): 10 mg via ORAL
  Filled 2019-11-24 (×22): qty 1

## 2019-11-24 MED ORDER — JUVEN PO PACK
1.0000 | PACK | Freq: Two times a day (BID) | ORAL | Status: DC
  Administered 2019-11-27 – 2019-12-17 (×40): 1 via ORAL
  Filled 2019-11-24 (×41): qty 1

## 2019-11-24 MED ORDER — MORPHINE SULFATE (PF) 2 MG/ML IV SOLN
2.0000 mg | INTRAVENOUS | Status: DC | PRN
  Administered 2019-11-24 – 2019-11-25 (×5): 2 mg via INTRAVENOUS
  Filled 2019-11-24 (×4): qty 1

## 2019-11-24 MED ORDER — SODIUM CHLORIDE 0.9 % IV SOLN
8.0000 mg/h | INTRAVENOUS | Status: DC
  Administered 2019-11-24 – 2019-11-26 (×5): 8 mg/h via INTRAVENOUS
  Filled 2019-11-24 (×8): qty 80

## 2019-11-24 MED ORDER — MORPHINE SULFATE (PF) 2 MG/ML IV SOLN
INTRAVENOUS | Status: AC
  Filled 2019-11-24: qty 1

## 2019-11-24 NOTE — Progress Notes (Signed)
Initial Nutrition Assessment  DOCUMENTATION CODES:   Not applicable  INTERVENTION:    Boost Breeze po BID, each supplement provides 250 kcal and 9 grams of protein  Juven Fruit Punch BID, each serving provides 95kcal and 2.5g of protein (amino acids glutamine and arginine)  NUTRITION DIAGNOSIS:   Increased nutrient needs related to wound healing as evidenced by estimated needs.  GOAL:   Patient will meet greater than or equal to 90% of their needs  MONITOR:   Diet advancement, PO intake, Supplement acceptance, Weight trends, Labs, I & O's, Skin  REASON FOR ASSESSMENT:   Consult Wound healing  ASSESSMENT:   Patient with PMH significant for CVA, HTN, HLD, DM, DM, PAD s/p R BKA, and chronic back pain. Presents this admission with hematemesis, diskitis, and LLE wounds.   RD working remotely. Unable to reach pt by phone.   Per H&P, pt had multiple vomiting episodes PTA. Currently on a clear liquid diet. Will attempt to obtain nutrition/weight history if possible. RD to provide supplementation to maximize kcal and protein this admission.   No admission weight obtained. Will need to get recent weight to assess for weight loss.   Drips: NS @ 20 ml/hr  Medications: SS novolog  Labs: CBG 198  Diet Order:   Diet Order            Diet clear liquid Room service appropriate? Yes; Fluid consistency: Thin  Diet effective now              EDUCATION NEEDS:   Not appropriate for education at this time  Skin:  Skin Assessment: Skin Integrity Issues: Skin Integrity Issues:: Unstageable Unstageable: L heel  Last BM:  PTA  Height:   Ht Readings from Last 1 Encounters:  11/24/19 6\' 6"  (1.981 m)    Weight:   Wt Readings from Last 1 Encounters:  10/08/19 112.1 kg    Ideal Body Weight:  91 kg(adjusted R BKA)  BMI:  Body mass index is 28.56 kg/m.  Estimated Nutritional Needs:   Kcal:  2400-2600 kcal  Protein:  120-135 grams  Fluid:  >/= 2.4 L/day   Mariana Single RD, LDN Clinical Nutrition Pager listed in [place]

## 2019-11-24 NOTE — Progress Notes (Signed)
Pt came down to MRI for exams.  Upon questioning pt he said he had part of an MRI in the past and was told her could never have another MRI and said he did much worse afterwards.  I had looked at his older films of his fusion and thought perhaps it might have heated up during scan.  I called the radiologist line since I don't have access to records at [facility] and the patient had multiple MR exams scheduled over the past 10 years and all of them had been cancelled.  I spoke with the radiologist and he determined there was no implant/ferrous reason why we wouldn't be able to scan the patient and to put protocols in place for such.  I went out to talk to the patient and explain what we would do and he said he refused, he didn't want to do the exams.  Respecting his wishes, pt was sent back to his room.  If exams are still warranted, the only way this could happen is under general anesthesia which he still would have to sign a consent form and I personally don't think he would agree to this.  Any questions, please call the MRI department at [phone].   Thanks

## 2019-11-24 NOTE — Consult Note (Signed)
WOC Nurse Consult Note: Patient receiving care in [facility] [phone]. Reason for Consult: LLE wrapped Wound type: Patient has skin changes associated with long standing venous insufficiency.  No open, wet wounds at time of assessment. Pressure Injury POA: Yes/No/NA Measurement: Wound bed: Drainage (amount, consistency, odor)  Periwound: Dressing procedure/placement/frequency: Weekly application of unna boot to LLE.  Order placed.  Thank you for the consult.  Discussed plan of care with the patient and bedside nurse.  [place] nurse will not follow at this time.  Please re-consult the [place] team if needed.  Val Riles, RN, MSN, CWOCN, CNS-BC, pager [phone]

## 2019-11-24 NOTE — Consult Note (Signed)
PV Consult acknowledged and chart reviewed.  Able to see patient in his room. He was admitted 11/24/2019 from [facility] with upper GI bleed. On admission was noted to have changes/ulceration to left lower extremity. WOC eval states skin consistent with chronic venous insufficiency. No drainage. Skin mostly dry and flaky with hemosidirin staining present. Charcot foot deformity with hard, but stable callous. Orders in place for [employer] application weekly. Old R BKA.  PMH includes: R BKA, DM-2, HTN, CHF, Pulmonary embolus and venous insufficiency  Active consults in place: Diabetes Coordinator, Nutrition and TOC.  Patient may need a follow up appointment with [facility] to monitor/treat  left foot callous. He says it has been a while since his last appointment.  VAS [place] ABI results pending.  States he is willing to return to [facility] at discharge for continued "treatment for his spine and help with pain control".  Denies other barriers or questions at this time. Left my contact information should questions regarding vascular status arise.   Thank you,  Cletis Media RN BSN [place] [phone]

## 2019-11-24 NOTE — Progress Notes (Signed)
New Admission Note:   Arrival Method: carelink stretcher Mental Orientation: alert x 4 Telemetry: yes box 1see flowsheet Assessment: Completed Skin: see flowsheet IV: right AC left Forearm Pain: chronic pain DR Hal Hope notified  Tubes: none Safety Measures: Safety Fall Prevention Plan has been discussed Admission: completed [facility] [place] Orientation: Patient has been orientated to the room, unit and staff.  Family:none  Orders: DR Hal Hope notified orders have been reviewed and implemented. Will continue to monitor the patient. Call light has been placed within reach and bed alarm has been activated.   Rockie Neighbours BSN, RN Phone number: [phone]

## 2019-11-24 NOTE — Progress Notes (Addendum)
Subjective: Patient reports "My back hurts from here to here (pointing midthoracic to coccyx) like it did before I left the hospital in December. I haven't been able to use this (right) leg since my fall Saturday."  Objective: Vital signs in last 24 hours: Temp:  [97.6 F (36.4 C)-98.6 F (37 C)] 98.6 F (37 C) (02/04 1325) Pulse Rate:  [116-135] 116 (02/04 1325) Resp:  [16-18] 18 (02/04 1325) BP: (175-180)/(114-123) 178/123 (02/04 1325) SpO2:  [97 %-100 %] 97 % (02/04 1325)  Intake/Output from previous day: No intake/output data recorded. Intake/Output this shift: Total I/O In: 2138.7 [P.O.:1455; I.V.:683.7] Out: 600 [Urine:600]  Alert, conversant. Cooperative with exam. Reports Hx of discitis treated with IVAB at [facility] and then at home, ending in December. On Saturday of last week, he states he stood from his bed to walk and his legs gave way, causing him to fall to the floor. Back pain immediately increased and he noticed new weakness in his right hip/thigh.  Exam reveals good strength BUE, with some resistance exercises eliciting back pain. FROM and good strength LLE. Movement of right thigh/hip on command is limited by lumbar pain.  MRI's were ordered but pt refused after arriving in Radiology Dept. He again states that after several MRI's over the years, at his last MRI he was "removed from the tube and told that he could never have another MRI". He states he was not given a reason for this declaration, but he currently fears severe pain with lying supine on the MRI table.   Lab Results: Recent Labs    11/24/19 0614 11/24/19 1100  WBC 10.1 8.5  HGB 10.8* 10.9*  HCT 34.8* 35.1*  PLT 195 202   BMET Recent Labs    11/24/19 0614  NA 141  K 4.1  CL 102  CO2 24  GLUCOSE 198*  BUN 42*  CREATININE 2.73*  CALCIUM 9.1    Studies/Results: No results found.  Assessment/Plan:   LOS: 0 days  At present, pt is willing to proceed with MRI's under sedation.  He should be  treated with IV ABX per ID service.  I doubt there is a role for neurosurgical intervention.  Verdis Prime 11/24/2019, 3:30 PM

## 2019-11-24 NOTE — Consult Note (Signed)
Reason for Consult: Hematemesis Referring Physician: [facility]  Rosalene Billings HPI: This is a 69 year old male with multiple medical problems admitted for coffee-ground emesis.  The history was most obtained from the chart as does not recall any coffee-ground emesis.  He does report having nausea and vomiting, but no recall of coffee-ground emesis.  In the past he had an EGD many years ago at [facility], but he cannot recall the indication or the findings.  Currently, he feels that the source of his issues is from his back.  His HGB upon transfer was at 10/8 g/dL, which is markedly higher than his 7.9 g/dL value on 10/05/2019.  He does not report any problems hematochezia or melena.  Past Medical History:  Diagnosis Date  . Arthritis   . BPH (benign prostatic hyperplasia) 11/24/2019  . Chronic pain syndrome 11/24/2019  . CKD (chronic kidney disease), stage IV ([place]) 11/24/2019  . Diabetes mellitus without complication ([place])   . Gout   . Hypertension   . Muscle pain   . Physical deconditioning 11/24/2019  . Scars   . Swelling     Past Surgical History:  Procedure Laterality Date  . BELOW KNEE LEG AMPUTATION Right   . IR FLUORO GUIDE CV LINE RIGHT  09/05/2019  . IR US GUIDE VASC ACCESS RIGHT  09/05/2019    Family History  Family history unknown: Yes    Social History:  reports that he has never smoked. He has never used smokeless tobacco. He reports that he does not drink alcohol or use drugs.  Allergies:  Allergies  Allergen Reactions  . Other Other (See Comments)    Blood pressure issues Per [facility] hospital: Pt states he can only take these pain meds or else he gets very sick: Oxycontin,morphine,demerol, and dilaudid are the only pain meds pt states he can take.    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 2.4.2021: He also reports that he currently take Oxycontin.   Marland Kitchen Oxymorphone Other (See Comments)   Causes kidney problems  . Aspirin Nausea And Vomiting    Per [facility]  . Beta Vulgaris Nausea And Vomiting    Beets  . Buspirone Other (See Comments)    unknown  . Cabbage Nausea And Vomiting  . Codeine Other (See Comments)    unknown  . Fish Allergy Nausea And Vomiting  . Fish-Derived Products Nausea And Vomiting  . Pentazocine Other (See Comments)    unknown  . Propoxyphene Other (See Comments)    unknown  . Shellfish Allergy Nausea And Vomiting  . Sulfa Antibiotics Hives  . Sulfasalazine Other (See Comments)  . Amoxicillin Rash    Per [facility]  . Apap [Acetaminophen] Rash    Medications:  Scheduled: . allopurinol  100 mg Oral BID  . amLODipine  10 mg Oral Daily  . cloNIDine  0.3 mg Oral TID  . feeding supplement  1 Container Oral BID BM  . finasteride  5 mg Oral Daily  . hydrALAZINE  50 mg Oral Q8H  . insulin aspart  0-15 Units Subcutaneous TID WC  . insulin aspart  0-5 Units Subcutaneous QHS  . levothyroxine  50 mcg Oral Q0600  . metoprolol tartrate  50 mg Oral BID  . nutrition supplement (JUVEN)  1 packet Oral BID BM  . sodium chloride flush  3 mL Intravenous Q12H  . tamsulosin  0.4 mg Oral Daily  Continuous: . sodium chloride    . lactated ringers 100 mL/hr at 11/24/19 1455  . pantoprozole (PROTONIX) infusion 8 mg/hr (11/24/19 1005)    Results for orders placed or performed during the hospital encounter of 11/24/19 (from the past 24 hour(s))  Comprehensive metabolic panel     Status: Abnormal   Collection Time: 11/24/19  6:14 AM  Result Value Ref Range   Sodium 141 135 - 145 mmol/L   Potassium 4.1 3.5 - 5.1 mmol/L   Chloride 102 98 - 111 mmol/L   CO2 24 22 - 32 mmol/L   Glucose, Bld 198 (H) 70 - 99 mg/dL   BUN 42 (H) 8 - 23 mg/dL   Creatinine, Ser 2.73 (H) 0.61 - 1.24 mg/dL   Calcium 9.1 8.9 - 10.3 mg/dL   Total Protein 8.1 6.5 - 8.1 g/dL   Albumin 3.2 (L) 3.5 - 5.0 g/dL   AST 27 15 - 41 U/L   ALT 13 0 - 44 U/L   Alkaline  Phosphatase 75 38 - 126 U/L   Total Bilirubin 0.7 0.3 - 1.2 mg/dL   GFR calc non Af Amer 23 (L) >60 mL/min   GFR calc Af Amer 27 (L) >60 mL/min   Anion gap 15 5 - 15  CBC with Differential/Platelet     Status: Abnormal   Collection Time: 11/24/19  6:14 AM  Result Value Ref Range   WBC 10.1 4.0 - 10.5 K/uL   RBC 3.45 (L) 4.22 - 5.81 MIL/uL   Hemoglobin 10.8 (L) 13.0 - 17.0 g/dL   HCT 34.8 (L) 39.0 - 52.0 %   MCV 100.9 (H) 80.0 - 100.0 fL   MCH 31.3 26.0 - 34.0 pg   MCHC 31.0 30.0 - 36.0 g/dL   RDW 15.2 11.5 - 15.5 %   Platelets 195 150 - 400 K/uL   nRBC 0.0 0.0 - 0.2 %   Neutrophils Relative % 91 %   Neutro Abs 9.2 (H) 1.7 - 7.7 K/uL   Lymphocytes Relative 5 %   Lymphs Abs 0.5 (L) 0.7 - 4.0 K/uL   Monocytes Relative 3 %   Monocytes Absolute 0.3 0.1 - 1.0 K/uL   Eosinophils Relative 0 %   Eosinophils Absolute 0.0 0.0 - 0.5 K/uL   Basophils Relative 0 %   Basophils Absolute 0.0 0.0 - 0.1 K/uL   Immature Granulocytes 1 %   Abs Immature Granulocytes 0.06 0.00 - 0.07 K/uL  Type and screen [place]     Status: None   Collection Time: 11/24/19  6:14 AM  Result Value Ref Range   ABO/RH(D) A POS    Antibody Screen NEG    Sample Expiration      11/27/2019,2359 Performed at [facility], [address] [place], [place] [address]   Protime-INR     Status: None   Collection Time: 11/24/19  6:14 AM  Result Value Ref Range   Prothrombin Time 13.5 11.4 - 15.2 seconds   INR 1.0 0.8 - 1.2  ABO/Rh     Status: None   Collection Time: 11/24/19  6:14 AM  Result Value Ref Range   ABO/RH(D)      A POS Performed at [facility] [address] [place], [place] [address]   CBC with Differential/Platelet     Status: Abnormal   Collection Time: 11/24/19 11:00 AM  Result Value Ref Range   WBC 8.5 4.0 - 10.5 K/uL   RBC 3.51 (L) 4.22 -  5.81 MIL/uL   Hemoglobin 10.9 (L) 13.0 - 17.0 g/dL   HCT 35.1 (L) 39.0 - 52.0 %   MCV 100.0 80.0 - 100.0 fL   MCH 31.1  26.0 - 34.0 pg   MCHC 31.1 30.0 - 36.0 g/dL   RDW 15.3 11.5 - 15.5 %   Platelets 202 150 - 400 K/uL   nRBC 0.0 0.0 - 0.2 %   Neutrophils Relative % 88 %   Neutro Abs 7.4 1.7 - 7.7 K/uL   Lymphocytes Relative 7 %   Lymphs Abs 0.6 (L) 0.7 - 4.0 K/uL   Monocytes Relative 4 %   Monocytes Absolute 0.4 0.1 - 1.0 K/uL   Eosinophils Relative 0 %   Eosinophils Absolute 0.0 0.0 - 0.5 K/uL   Basophils Relative 0 %   Basophils Absolute 0.0 0.0 - 0.1 K/uL   Immature Granulocytes 1 %   Abs Immature Granulocytes 0.06 0.00 - 0.07 K/uL  Hemoglobin A1c     Status: Abnormal   Collection Time: 11/24/19 11:00 AM  Result Value Ref Range   Hgb A1c MFr Bld 5.7 (H) 4.8 - 5.6 %   Mean Plasma Glucose 116.89 mg/dL  HIV Antibody (routine testing w rflx)     Status: None   Collection Time: 11/24/19 11:00 AM  Result Value Ref Range   HIV Screen 4th Generation wRfx NON REACTIVE NON REACTIVE  Sedimentation rate     Status: Abnormal   Collection Time: 11/24/19 11:00 AM  Result Value Ref Range   Sed Rate 132 (H) 0 - 16 mm/hr  C-reactive protein     Status: Abnormal   Collection Time: 11/24/19 11:00 AM  Result Value Ref Range   CRP 8.3 (H) <1.0 mg/dL  Prealbumin     Status: None   Collection Time: 11/24/19 11:00 AM  Result Value Ref Range   Prealbumin 23.8 18 - 38 mg/dL     No results found.  ROS:  As stated above in the HPI otherwise negative.  Blood pressure (!) 178/123, pulse (!) 116, temperature 98.6 F (37 C), temperature source Oral, resp. rate 18, height 6\' 6"  (1.981 m), SpO2 97 %.    PE: Gen: NAD, Alert and Oriented HEENT:  [place]/AT, EOMI Neck: Supple, no LAD Lungs: CTA Bilaterally CV: RRR without M/G/R ABM: Soft, tender in the suprapubic region, no rebound, +BS Ext: No C/C/E  Assessment/Plan: 1) Coffee-ground emesis. 2) Chronic anemia. 3) Abdominal pain.   His abdominal pain was not elicited until there was palpation.  There is no evidence of an acute abdomen.  The source of the pain  is unclear.  Further evaluation with an EGD will be performed to work up the coffee-ground emesis.  Plan: 1) EGD tomorrow.  Jaymie Misch D 11/24/2019, 3:35 PM

## 2019-11-24 NOTE — Consult Note (Signed)
[facility]    Date of Admission:  11/24/2019     Total days of antibiotics 0              Reason for Consult: Osteomyelitis/Discitis T10-11     Referring Provider: yates Primary Care Provider: Antonietta Jewel, MD    Assessment: Thomas [name] is a 69 y.o. 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 He has had acutely worsening back pain following a mechanical fall but now states that he has inability to move right limb. CT scan at [facility] reveals no advancing progression of previously known discitis at T10-11 but mentions a widening of the disk space in comparison to previous CT scan done November 28,2020.  His exam findings warrant evaluation with MRI to assess for possible epidural abscess however he is refusing further imaging.  His inflammatory markers remain elevated however he has confounding inflammatory medical conditions that make it challenging interpret these non-specific labs; albeit they would typically be concerning given vertebral infection.   Would ask IR to consider aspiration of the disc space here to culture the fluid here to see if there is recurrent MRSA infection vs growth of a new organism. Would also ask neurosurgery to see him in evaluation as well to help given his inability to allow MRI for epidural space assessment.   Would hold on antibiotics at this time pending further studies to guide treatment.    Plan: 1. Hold antibiotics  2. Consult IR to aspirate disc space  3. Neurosurgery consult for complaint of right leg numbness/weakenss     Principal Problem:   Upper GI bleeding Active Problems:   [name], type 2) (HCC)   Gout   HTN (hypertension)   Discitis   Pressure injury of skin   BPH (benign prostatic hyperplasia)   CKD (chronic kidney disease), stage IV (HCC)   Chronic pain syndrome   Physical  deconditioning   . allopurinol  100 mg Oral BID  . amLODipine  10 mg Oral Daily  . cloNIDine  0.3 mg Oral TID  . feeding supplement  1 Container Oral BID BM  . finasteride  5 mg Oral Daily  . hydrALAZINE  50 mg Oral Q8H  . insulin aspart  0-15 Units Subcutaneous TID WC  . insulin aspart  0-5 Units Subcutaneous QHS  . levothyroxine  50 mcg Oral Q0600  . metoprolol tartrate  50 mg Oral BID  . nutrition supplement (JUVEN)  1 packet Oral BID BM  . sodium chloride flush  3 mL Intravenous Q12H  . tamsulosin  0.4 mg Oral Daily    HPI: Thomas [name] is a 69 y.o. male transferred from [place] after he presented from home with hematemesis and acute back pain.   He was recently seen by our team in November 2020 for acute T10-11 discitis with MRSA bacteremia. His vertebral disease was identified on outside hospital CT scan and never had MRI to evaluate epidural space due to possible contraindication for MRI. TTE revealed no concern for large vegetations or valve dysfunction. He was on IV daptomycin at that time (given poor renal function) and was planning to D/C to SNF. He eventually declined this option and was discharged home on 12/20 with home health nursing to give first of 3 weekly Dalbavancin in fusions 12/21, which puts his last dose January 4th 2021.   He was  found to have a left lower leg wound with cellulitis I believe in the outpatient setting (hard to get a timeline from the patient) for which he was given Clindamycin on 1/22 x 10d. On Sunday 1/31 he suffered a fall where he was on the floor for a while. 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 He was re-admitted 1/[facility] to [facility] at this time. CT scan of the spine revealed wider area of T10-11 disk space but no progression of osteomyelitis/dicitis to involve any other levels of spine. Does not sound like he has had any fevers. Mentions that his back pain started  worsening after the fall but not after antibiotics. Also mention in the chart he had a fever to 103 F.   He is mostly heavily focused on pain medication regimen. I do not see where he was given any antibiotics at [facility] in review of shadow chart. He has not had any fevers so far here at [facility]. Blood cultures are drawn and pending. No antibiotics have been given.    Review of Systems: Review of Systems  Constitutional: Negative for diaphoresis, fever and malaise/fatigue.  Respiratory: Negative for cough and sputum production.   Cardiovascular: Negative for chest pain and leg swelling.  Musculoskeletal: Positive for back pain. Negative for joint pain.  Neurological: Positive for weakness (inability to move right leg ). Tingling: numbness of R leg.    Past Medical History:  Diagnosis Date  . Arthritis   . BPH (benign prostatic hyperplasia) 11/24/2019  . Chronic pain syndrome 11/24/2019  . CKD (chronic kidney disease), stage IV ([place]) 11/24/2019  . Diabetes mellitus without complication ([place])   . Gout   . Hypertension   . Muscle pain   . Physical deconditioning 11/24/2019  . Scars   . Swelling     Social History   Tobacco Use  . Smoking status: Never Smoker  . Smokeless tobacco: Never Used  Substance Use Topics  . Alcohol use: No  . Drug use: No    Family History  Family history unknown: Yes   Allergies  Allergen Reactions  . Other Other (See Comments)    Blood pressure issues Per [facility] hospital: Pt states he can only take these pain meds or else he gets very sick: Oxycontin,morphine,demerol, and dilaudid are the only pain meds pt states he can take.    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 2.4.2021: He also reports that he currently take Oxycontin.   Marland Kitchen Oxymorphone Other (See Comments)    Causes kidney problems  . Aspirin Nausea And Vomiting    Per [facility]  . Beta Vulgaris  Nausea And Vomiting    Beets  . Buspirone Other (See Comments)    unknown  . Cabbage Nausea And Vomiting  . Codeine Other (See Comments)    unknown  . Fish Allergy Nausea And Vomiting  . Fish-Derived Products Nausea And Vomiting  . Pentazocine Other (See Comments)    unknown  . Propoxyphene Other (See Comments)    unknown  . Shellfish Allergy Nausea And Vomiting  . Sulfa Antibiotics Hives  . Sulfasalazine Other (See Comments)  . Amoxicillin Rash    Per [facility]  . Apap [Acetaminophen] Rash    OBJECTIVE: Blood pressure (!) 178/123, pulse (!) 116, temperature 98.6 F (37 C), temperature source Oral, resp. rate 18, height 6'  6" (1.981 m), SpO2 97 %.  Physical Exam Vitals reviewed.  Constitutional:      Comments: Resting in bed, eyes closed.   Cardiovascular:     Rate and Rhythm: Regular rhythm. Tachycardia present.     Heart sounds: No murmur.  Pulmonary:     Effort: Pulmonary effort is normal.     Breath sounds: Normal breath sounds.  Abdominal:     General: Bowel sounds are normal. There is no distension.  Musculoskeletal:     Comments: No pain with passive ROM of R AKA limb. No pain with passive ROM of L leg with straight raise.  States he cannot move the right AKA limb.   Skin:    General: Skin is warm and dry.     Comments: Scaled and dry skin from toes on left foot up through shin/knee. Stasis dermatitis and chronic changes d/t lymphedema. Multiple areas of cracked flaking skin. No erythema/warmth or tenderness. Right stump with scaled flaking areas. No open lesions or signs of cellulitis.   Neurological:     Mental Status: He is alert and oriented to person, place, and time.            Lab Results Lab Results  Component Value Date   WBC 8.5 11/24/2019   HGB 10.9 (L) 11/24/2019   HCT 35.1 (L) 11/24/2019   MCV 100.0 11/24/2019   PLT 202 11/24/2019    Lab Results  Component Value Date   CREATININE 2.73 (H) 11/24/2019   BUN 42 (H) 11/24/2019    NA 141 11/24/2019   K 4.1 11/24/2019   CL 102 11/24/2019   CO2 24 11/24/2019    Lab Results  Component Value Date   ALT 13 11/24/2019   AST 27 11/24/2019   ALKPHOS 75 11/24/2019   BILITOT 0.7 11/24/2019     Microbiology: No results found for this or any previous visit (from the past 240 hour(s)).   Janene Madeira, MSN, NP-C [facility] for Infectious Disease [email or web address] Pager: [phone] Office: [phone] [place]: [phone]

## 2019-11-24 NOTE — Progress Notes (Signed)
Attempted. Leg was just wrapped. Per Earlie Server, RN do exam tomorrow when they take the wrap off.

## 2019-11-24 NOTE — H&P (Addendum)
History and Physical    Thomas [name] QDI:[id number] DOB: 17-Jun-1951 DOA: 11/24/2019  PCP: Antonietta Jewel, MD Consultants:  None Patient coming from:  Home - lives alone; NOK: Brother, [phone] Thomas [name])  Chief Complaint: Hematemesis  HPI: Thomas [name] is a 69 y.o. male with medical history significant of CVA, hypertension, hyperlipidemia, diabetes, gout, DM, PAD status post right BKA, chronic anemia, chronic back pain who was admitted on 1/31 to [facility] after worsening ambulatory dysfunction and fall unable to get up on his own with prolonged down time.  He was found to have a LLE wound/cellulitis with concern for outpatient treatment failure with Clindamycin.  He was treated with IV Dapto (unable to take Vanc) with plan to transition to Linezolid at d/c (due to h/o MRSA bacteremia/diskitis in 10/2019).  In the evening of 2/3, he developed abdominal pain with n/v with coffee ground emesis that was gastroccult positive and so he was transferred to [facility] for GI evaluation.  Patient was snoring when I entered the room.  I woke him up and he immediately said, "I need a pain shot.  I haven't had one since 2.  I am hurting real bad."  And then he immediately went back to sleep.    He woke up Sunday, having trouble for a pretty good while.  He stood up and couldn't move his legs.  His legs buckled and he fell down and was down for several hours before he could get help.  He recently had diskitis, "I got it again."  He has a diabetic sore on his left foot, present for almost a year.  He doesn't know if it is getting better or worse, can't see it.  +fever to 103.  +back pain.  He can't stand to be lifted from a flat position.  He was unable to participate in PT due to the pain.  He reports that he was transferred because "they got to do some kind of test they couldn't do there."  He vomited without diarrhea.  He vomited "half a dozen or more" times, doesn't know if there was blood.  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  He was recommended to have IV Daptomycin as inpatient and transition to Dalbavancin at the time of d/c to home.  He was discharged with home health.    [place] [facility] Course, per Dr. Tonie Griffith:  Patient was to be evaluated by physical therapy during his hospital stay for further evaluations recommendations for physical therapy at SNF, however, patient continued to decline Physical therapy due to uncontrolled back pain.  Patient was prescribed home regiment with oxycodone as well as OxyContin in hopes to improve back pain however patient continues to be noncompliant with physical therapy despite our increase in his pain regimen.  The at this time we discussed the best course of action to improve his back pain is to get off of his back and up and ambulating with physical therapy or at least up to chair, patient again declined.  We discussed repeat imaging of his back but he again declined as he stated "it will change anything."   Review of Systems: As per HPI; otherwise review of systems reviewed and negative.   Ambulatory Status:  Ambulates with a crutch or cane with his prosthesis  Past Medical History:  Diagnosis Date  . Arthritis   . BPH (benign prostatic hyperplasia) 11/24/2019  . Chronic pain syndrome 11/24/2019  . CKD (chronic kidney  disease), stage IV ([place]) 11/24/2019  . Gout   . Hypertension   . Muscle pain   . Physical deconditioning 11/24/2019  . Scars   . Swelling     Past Surgical History:  Procedure Laterality Date  . IR FLUORO GUIDE CV LINE RIGHT  09/05/2019  . IR US GUIDE VASC ACCESS RIGHT  09/05/2019    Social History   Socioeconomic History  . Marital status: Single    Spouse name: Not on file  . Number of children: Not on file  . Years of education: Not on file  . Highest education level: Not on file  Occupational History  . Not on file  Tobacco Use  . Smoking status: Never Smoker  . Smokeless  tobacco: Never Used  Substance and Sexual Activity  . Alcohol use: No  . Drug use: No  . Sexual activity: Not on file  Other Topics Concern  . Not on file  Social History Narrative  . Not on file   Social Determinants of Health   Financial Resource Strain:   . Difficulty of Paying Living Expenses: Not on file  Food Insecurity:   . Worried About Charity fundraiser in the Last Year: Not on file  . Ran Out of Food in the Last Year: Not on file  Transportation Needs:   . Lack of Transportation (Medical): Not on file  . Lack of Transportation (Non-Medical): Not on file  Physical Activity:   . Days of Exercise per Week: Not on file  . Minutes of Exercise per Session: Not on file  Stress:   . Feeling of Stress : Not on file  Social Connections:   . Frequency of Communication with Friends and Family: Not on file  . Frequency of Social Gatherings with Friends and Family: Not on file  . Attends Religious Services: Not on file  . Active Member of Clubs or Organizations: Not on file  . Attends Archivist Meetings: Not on file  . Marital Status: Not on file  Intimate Partner Violence:   . Fear of Current or Ex-Partner: Not on file  . Emotionally Abused: Not on file  . Physically Abused: Not on file  . Sexually Abused: Not on file    Allergies  Allergen Reactions  . Other Other (See Comments)    Blood pressure issues Per [facility] hospital: Pt states he can only take these pain meds or else he gets very sick: Oxycontin,morphine,demerol, and dilaudid are the only pain meds pt states he can take.    Marland Kitchen Oxycodone Other (See Comments)    Cases extreme changes in Blood pressure Cases extreme changes in Blood pressure   . Oxycodone-Acetaminophen Other (See Comments)    Blood pressure issues, rash from acetaminophen, pt states he can take tylenol though.  . Oxymorphone Other (See Comments)    Causes kidney problems  . Aspirin Nausea And Vomiting    Per [facility]  .  Beta Vulgaris Other (See Comments)  . Buspirone Other (See Comments)  . Cabbage Other (See Comments)  . Codeine Other (See Comments)  . Fish Allergy Other (See Comments)  . Fish-Derived Products   . Pentazocine   . Propoxyphene Other (See Comments)  . Shellfish Allergy Other (See Comments)  . Shellfish-Derived Products   . Sulfa Antibiotics Hives  . Sulfasalazine Other (See Comments)  . Amoxicillin Rash    Per [facility]    Family History  Family history unknown: Yes    Prior to  Admission medications   Medication Sig Start Date End Date Taking? Authorizing Provider  allopurinol (ZYLOPRIM) 100 MG tablet Take 100 mg by mouth 2 (two) times daily.    [provider]  amLODipine (NORVASC) 10 MG tablet Take 1 tablet by mouth daily.    [provider]  cloNIDine (CATAPRES) 0.3 MG tablet Take 0.3 mg by mouth 3 (three) times daily.     [provider]  Cyanocobalamin (VITAMIN B-12) 5000 MCG TBDP Take 2,500 mcg by mouth daily.    [provider]  dalbavancin (DALVANCE) 500 MG SOLR Give Dalvance 1500 mg X 1 then 500 mg every 7 days X 3 doses Indication: MRSA bacteremia/discitis Last dose around 1/7 10/05/19   Georgette Shell, MD  ferrous sulfate 325 (65 FE) MG tablet Take 325 mg by mouth 2 (two) times daily.    [provider]  finasteride (PROSCAR) 5 MG tablet Take 1 tablet (5 mg total) by mouth daily. 10/10/19   Hosie Poisson, MD  gabapentin (NEURONTIN) 300 MG capsule Take 300 mg by mouth 3 (three) times daily as needed.  10/10/15   [provider]  glipiZIDE (GLUCOTROL XL) 10 MG 24 hr tablet Take 10 mg by mouth 2 (two) times daily.  12/20/15   [provider]  hydrALAZINE (APRESOLINE) 50 MG tablet Take 1 tablet (50 mg total) by mouth every 8 (eight) hours. 09/08/19   Nita Sells, MD  levothyroxine (SYNTHROID) 50 MCG tablet Take 50 mcg by mouth daily.     [provider]  metoprolol tartrate (LOPRESSOR)  50 MG tablet Take 1 tablet (50 mg total) by mouth 2 (two) times daily. 09/08/19   Nita Sells, MD  Multiple Vitamin (MULTIVITAMIN WITH MINERALS) TABS tablet Take 1 tablet by mouth daily. 10/10/19   Hosie Poisson, MD  nutrition supplement, JUVEN, (JUVEN) PACK Take 1 packet by mouth 2 (two) times daily between meals. 10/09/19   Hosie Poisson, MD  Oxycodone HCl 20 MG TABS Take 1 tablet (20 mg total) by mouth every 8 (eight) hours as needed. 10/07/19   Georgette Shell, MD  PEGLOTICASE IV Inject 1 Dose into the vein every 14 (fourteen) days.    [provider]  polyethylene glycol (MIRALAX / GLYCOLAX) 17 g packet Take 17 g by mouth daily.    [provider]  senna-docusate (SENOKOT-S) 8.6-50 MG tablet Take 1 tablet by mouth 2 (two) times daily. 10/09/19   Hosie Poisson, MD  tamsulosin (FLOMAX) 0.4 MG CAPS capsule Take 0.4 mg by mouth daily.    [provider]  tiZANidine (ZANAFLEX) 2 MG tablet Take 1 tablet (2 mg total) by mouth every 8 (eight) hours as needed for muscle spasms. 09/08/19   Nita Sells, MD  Vitamin D, Ergocalciferol, (DRISDOL) 1.25 MG (50000 UT) CAPS capsule Take 1 capsule by mouth once a week.    [provider]  Wheat Dextrin (BENEFIBER PO) Take 1 each by mouth daily.    [provider]    Physical Exam: Vitals:   11/24/19 0617 11/24/19 0619  BP: (!) 180/120 (!) 175/114  Pulse: (!) 135 (!) 128  Resp: 16   Temp: 97.6 F (36.4 C)   TempSrc: Oral   SpO2: 100%      . General:  Appears somnolent and comfortable, somewhat cantankerous, complaining of severe back pain when awake . Eyes:  EOMI, normal lids, iris . ENT:  grossly normal hearing, lips & tongue, mmm  . Neck:  no LAD, masses or thyromegaly .  Cardiovascular:  RR with tachycardia, no m/r/g.  . Respiratory:   CTA bilaterally with no wheezes/rales/rhonchi.  Normal respiratory effort. . Abdomen:  soft, R-sided TTP, ND, NABS . Skin:  L foot plantar surface  with dark lesions; left lower leg with lichenification, scaling but without obvious erythema or apparent cellulitis       . Musculoskeletal:  RLE as described above; s/p L BKA with scaling along the distal edge . Psychiatric:  cantankerous mood and affect, speech fluent and appropriate, AOx3 Neurologic:  CN 2-12 grossly intact, reports difficulty lifting R leg at all and has decreased ROM of LLE - difficult to tell if this is related to atrophy, deconditioning, back infection, pain    Radiological Exams on Admission: No results found.   [facility] spine at [facility]:  T10-11 with increased widening of the disc space, slightly worse prevertebral inflammatory changes c/w discitis/osteomyelitis Also with IVC filter in place, mild R hydroureter   EKG: Independently reviewed.  Sinus tachycardia with rate 136; nonspecific ST changes with no evidence of acute ischemia   Labs on Admission: I have personally reviewed the available labs and imaging studies at the time of the admission.  Pertinent labs:   Glucose 198 Hgb 10.8; 10.3 on 2/3, 9.4 on 2/2 BUN 42/Creatinine 2.73/GFR 23; 37/3.0 on 2/3; 38/3.1 on 2/2 INR 1.0 Gastroccult positive at [place] negative (PCR) on 2/1    Assessment/Plan Principal Problem:   Upper GI bleeding Active Problems:   [name], type 2) (HCC)   Gout   HTN (hypertension)   Discitis   Pressure injury of skin   BPH (benign prostatic hyperplasia)   CKD (chronic kidney disease), stage IV (HCC)   Chronic pain syndrome   Physical deconditioning    Hematemesis -Patient developed apparently acute onset of R-sided abdominal pain and n/v at [facility]; gastroccult positive secondary to upper GI bleeding.  -His Hgb appears to be stable at this time.  -The patient is currently tachycardic, suggesting acute volume loss.  -Type and screen were done  -will admit to progressive care bed -GI consulted; Dr. 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 in Nov/Dec -He was treated with Daptomycin (inability to tolerate Vanc, unclear reason why) and then transitioned to [facility] for presumed completion of treatment "around 1/7" -At home, he was too weak to stand and collapsed in the floor due to inability to bear weight -Repeat CT at [facility] appeared to show ongoing discitis and he was restarted on Daptomycin with plan for transition to Linezolid at the time of d/c -The patient had severe ongoing back pain and refused PT treatments -Concerning lab findings would include CRP >13.5, elevated ESR (goal is < 1/2 age +99 if male); CRP and ESR are pending.  WBC is often <15 and is currently 8.5. -ID has been consulted for guidance about ongoing therapy; Dr. Tommy Medal has recommended MRI of thoracic and lumbar spines and these studies are pending. -Neurosurgery consult may be considered based on ID recommendations -Further important considerations include nutrition (will order consult), diabetes control (no h/o, will check A1c). -He is on contact precautions. -Pt reports he is absolutely unable to get an MRI and refuses; neurosurgery has been consulted.  LE wounds -He has long-standing PVD with h/o R BKA and wounds on LLE -He had been treated with Clinda for cellulitis prior to his [facility] hospitalization and was transitioned to Daptomycin due to diskitis -He does not appear to have active infection/cellulitis at this time but has significant scaling and sloughing as well as some foot wounds. 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  He is receiving medications from only one provider and appears to be taking them as prescribed. -He is not at particularly high risk of opioid misuse, diversion, or overdose.  Deconditioning  -Likely needs SNF, although it appears he refused this during his last hospitalization -He has been refusing PT at [facility] - this may, however, be related to ongoing uncontrolled diskitis -Will need PT/OT evaluations once his acute medical situation is stabilized     Note: This patient has been tested and is negative for the novel coronavirus COVID-19.  DVT prophylaxis:  Foot plex Code Status:  Full - confirmed with patient Family Communication: None present; I attempted to call the patient's brother by telephone at the time of admission and was unable to reach him. Disposition Plan: He appears likely to need SNF placement or possibly CIR but it is too early to know for sure Consults called:  ID; GI; TOC team; diabetes coordinator; peripheral vascular navigator; wound care; and nutrition Admission status: Admit - It is my clinical opinion that admission to INPATIENT is reasonable and necessary because of the  expectation that this patient will require hospital care that crosses at least 2 midnights to treat this condition based on the medical complexity of the problems presented.  Given the aforementioned information, the predictability of an adverse outcome is felt to be significant.    Karmen Bongo MD [facility]   How to contact the [facility] Attending or Consulting provider [place] or covering provider during after hours [place], for this patient?  1. Check the care team in [facility] and look for a) attending/consulting [facility] provider listed and b) the [facility] team listed 2. Log into [email or web address] and use 's universal password to access. If you do not have the password, please contact the hospital operator. 3. Locate the [facility] provider you are looking for under [facility] and page to a number that you can be directly reached. 4. If you still have difficulty reaching the provider, please page the [facility] (Director on Call) for the Hospitalists listed on amion for assistance.   11/24/2019, 12:16 PM

## 2019-11-24 NOTE — Progress Notes (Signed)
Orthopedic Tech Progress Note Patient Details:  Thomas [name] 06-24-1951 [id number]  Ortho Devices Type of Ortho Device: Haematologist Ortho Device/Splint Location: LLE Ortho Device/Splint Interventions: Application, Ordered   Post Interventions Patient Tolerated: Well Instructions Provided: Care of device, Adjustment of device   Janit Pagan 11/24/2019, 2:45 PM

## 2019-11-24 NOTE — Progress Notes (Signed)
Covid test result from [place]. Hard Copy in the Chart.No need to repeat per Dr Lorin Mercy.

## 2019-11-25 ENCOUNTER — Inpatient Hospital Stay (HOSPITAL_COMMUNITY): Payer: Medicare Other | Admitting: Certified Registered"

## 2019-11-25 ENCOUNTER — Encounter (HOSPITAL_COMMUNITY)
Admission: AD | Disposition: A | Discharge status: Skilled Nursing Facility | Payer: Self-pay | Attending: Internal Medicine

## 2019-11-25 ENCOUNTER — Inpatient Hospital Stay (HOSPITAL_COMMUNITY): Payer: Medicare Other

## 2019-11-25 DIAGNOSIS — M549 Dorsalgia, unspecified: Secondary | ICD-10-CM

## 2019-11-25 DIAGNOSIS — Z794 Long term (current) use of insulin: Secondary | ICD-10-CM

## 2019-11-25 DIAGNOSIS — Z8739 Personal history of other diseases of the musculoskeletal system and connective tissue: Secondary | ICD-10-CM

## 2019-11-25 DIAGNOSIS — E1169 Type 2 diabetes mellitus with other specified complication: Principal | ICD-10-CM

## 2019-11-25 DIAGNOSIS — R5381 Other malaise: Secondary | ICD-10-CM

## 2019-11-25 HISTORY — PX: ESOPHAGOGASTRODUODENOSCOPY (EGD) WITH PROPOFOL: SHX5813

## 2019-11-25 HISTORY — PX: RADIOLOGY WITH ANESTHESIA: SHX6223

## 2019-11-25 LAB — CBC
HCT: 30.7 % — ABNORMAL LOW (ref 39.0–52.0)
Hemoglobin: 9.5 g/dL — ABNORMAL LOW (ref 13.0–17.0)
MCH: 31.1 pg (ref 26.0–34.0)
MCHC: 30.9 g/dL (ref 30.0–36.0)
MCV: 100.7 fL — ABNORMAL HIGH (ref 80.0–100.0)
Platelets: 160 10*3/uL (ref 150–400)
RBC: 3.05 MIL/uL — ABNORMAL LOW (ref 4.22–5.81)
RDW: 15 % (ref 11.5–15.5)
WBC: 5.7 10*3/uL (ref 4.0–10.5)
nRBC: 0 % (ref 0.0–0.2)

## 2019-11-25 LAB — BASIC METABOLIC PANEL
Anion gap: 10 (ref 5–15)
BUN: 44 mg/dL — ABNORMAL HIGH (ref 8–23)
CO2: 28 mmol/L (ref 22–32)
Calcium: 8.8 mg/dL — ABNORMAL LOW (ref 8.9–10.3)
Chloride: 105 mmol/L (ref 98–111)
Creatinine, Ser: 2.83 mg/dL — ABNORMAL HIGH (ref 0.61–1.24)
GFR calc Af Amer: 26 mL/min — ABNORMAL LOW (ref >60)
GFR calc non Af Amer: 22 mL/min — ABNORMAL LOW (ref >60)
Glucose, Bld: 141 mg/dL — ABNORMAL HIGH (ref 70–99)
Potassium: 4.3 mmol/L (ref 3.5–5.1)
Sodium: 143 mmol/L (ref 135–145)

## 2019-11-25 LAB — GLUCOSE, CAPILLARY
Glucose-Capillary: 124 mg/dL — ABNORMAL HIGH (ref 70–99)
Glucose-Capillary: 117 mg/dL — ABNORMAL HIGH (ref 70–99)
Glucose-Capillary: 116 mg/dL — ABNORMAL HIGH (ref 70–99)
Glucose-Capillary: 186 mg/dL — ABNORMAL HIGH (ref 70–99)

## 2019-11-25 IMAGING — MR MR LUMBAR SPINE W/O CM
4 of 6 series · 25 of 48 positions shown · non-contrast
Comparison: CT scans dated [DATE]

CLINICAL DATA: T10-11 discitis and osteomyelitis.

EXAM:
MRI THORACIC AND LUMBAR SPINE WITHOUT CONTRAST
TECHNIQUE: Multiplanar and multiecho pulse sequences of the thoracic and lumbar
spine were obtained without intravenous contrast.

[Series 1: T2 · sagittal · 4.0mm · 0.83mm/px · 6 of 18 slices shown (1 of 2)]
[im 1/18]
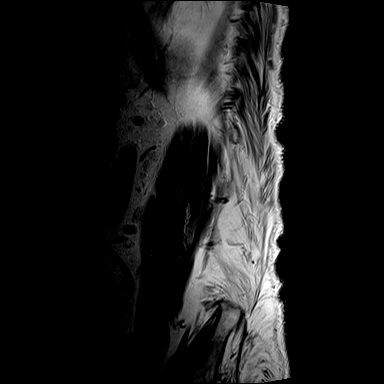
[im 4/18]
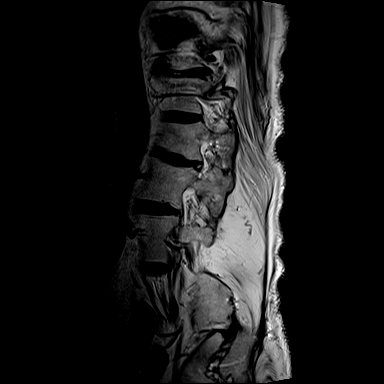
[im 7/18]
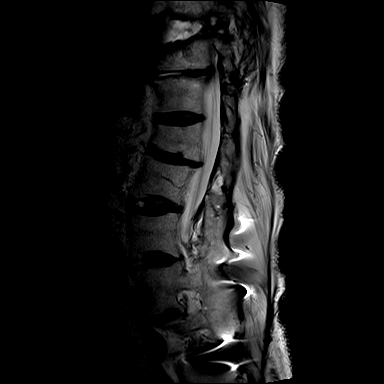
[im 11/18]
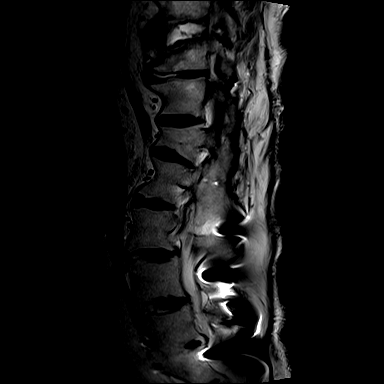
[im 14/18]
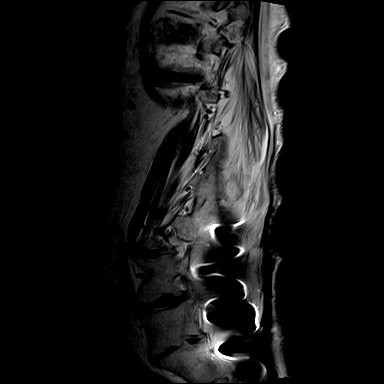
[im 18/18]
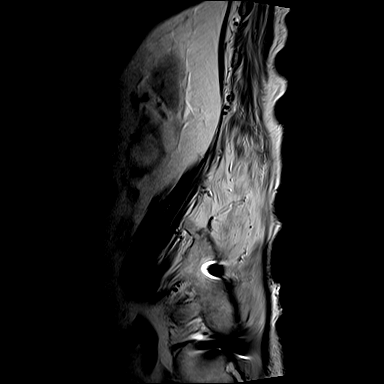

[Series 3: T1 · sagittal · 4.0mm · 1.00mm/px · 6 of 18 slices shown (1 of 2)]
[im 1/18]
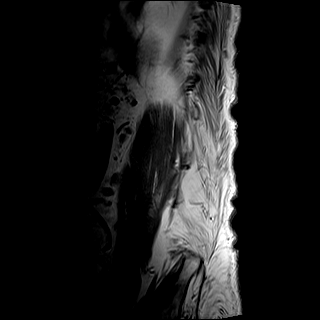
[im 4/18]
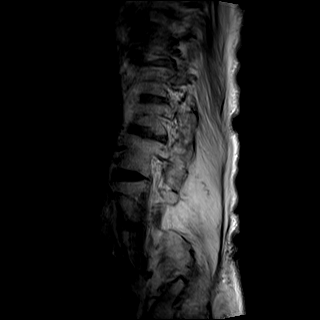
[im 7/18]
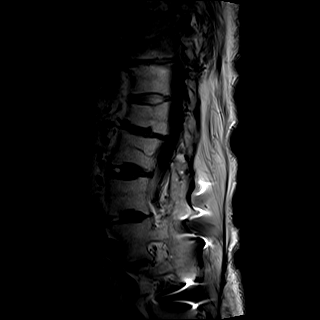
[im 11/18]
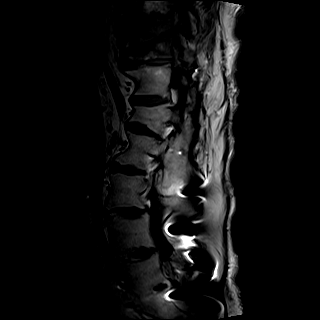
[im 14/18]
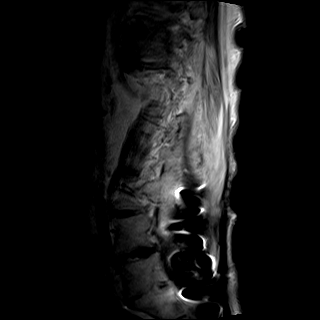
[im 18/18]
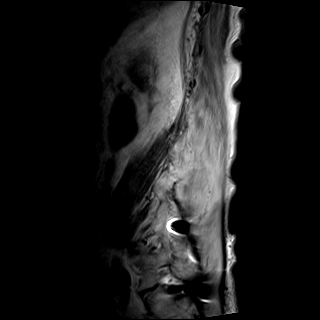

[Series 5: T2 · axial · 5.0mm · 0.57mm/px · z∈[-419,-157]mm · 9 of 33 slices shown (2 of 2)]
[im 1/33]
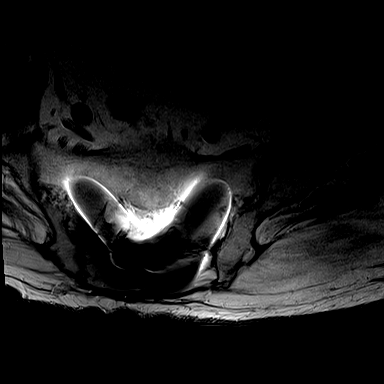
[im 6/33]
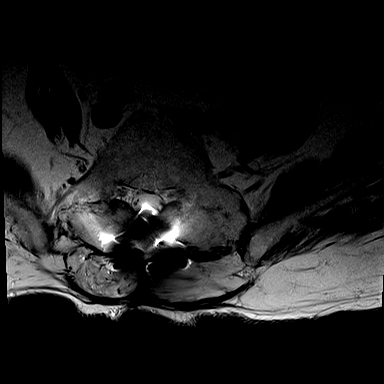
[im 9/33]
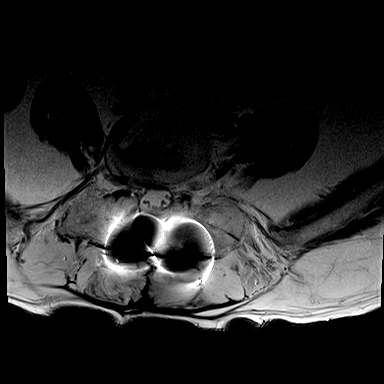
[im 15/33]
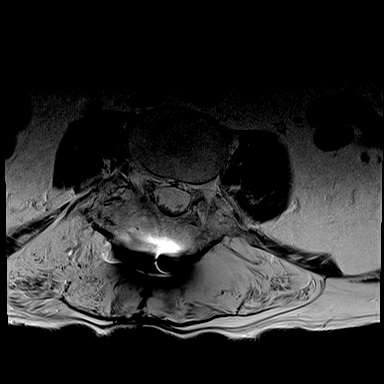
[im 18/33]
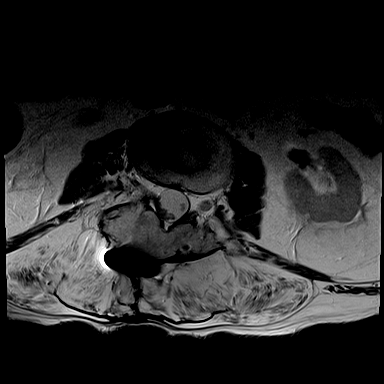
[im 24/33]
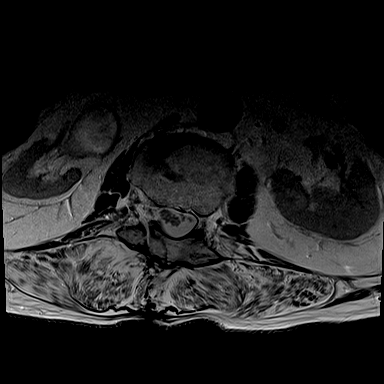
[im 27/33]
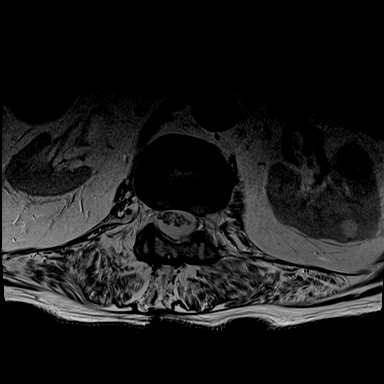
[im 30/33]
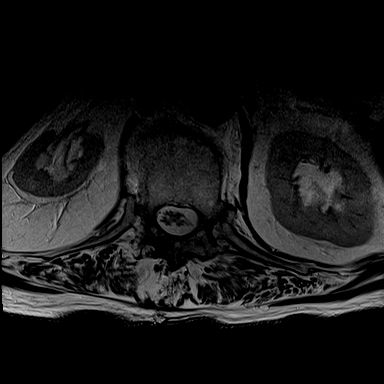
[im 33/33]
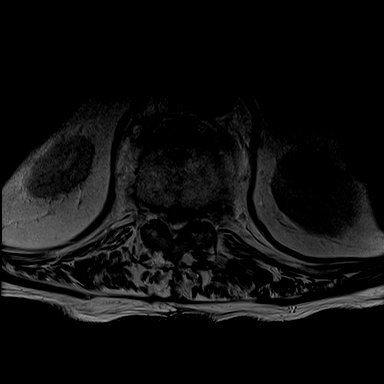

[Series 6: T1 · axial · 5.0mm · 0.34mm/px · z∈[-419,-180]mm · 4 of 33 slices shown (2 of 2)]
[im 1/33]
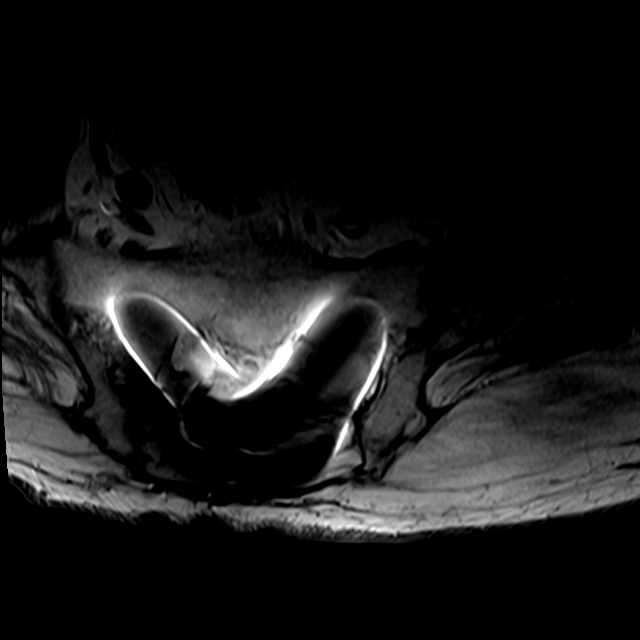
[im 6/33]
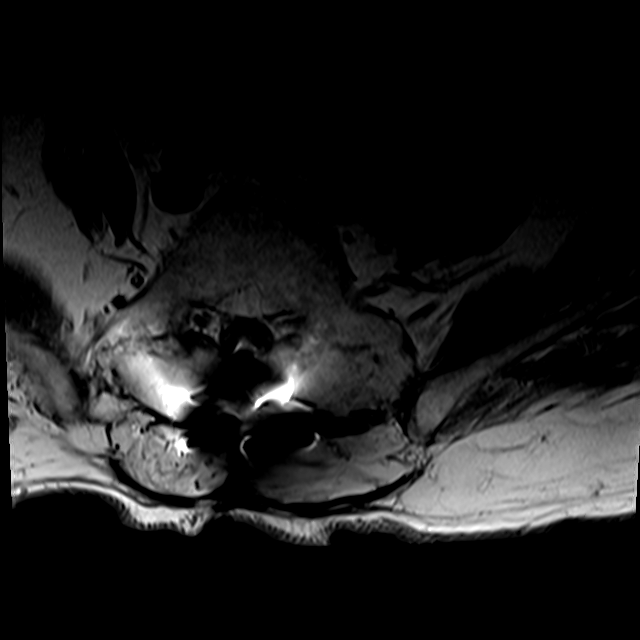
[im 18/33]
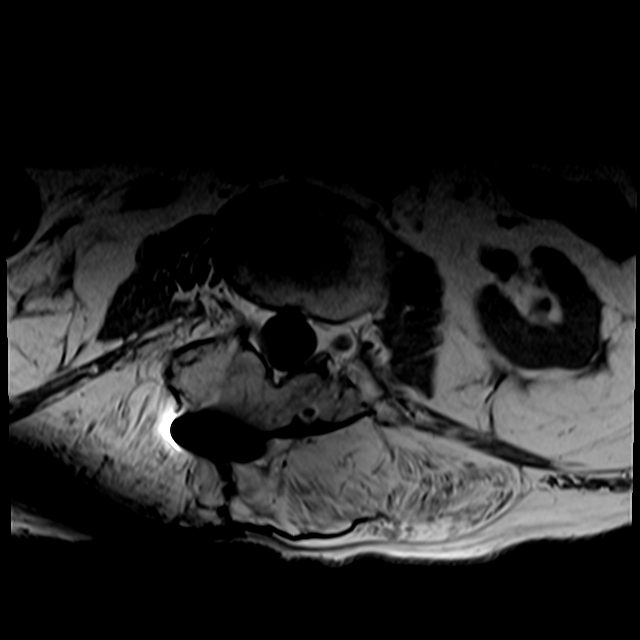
[im 30/33]
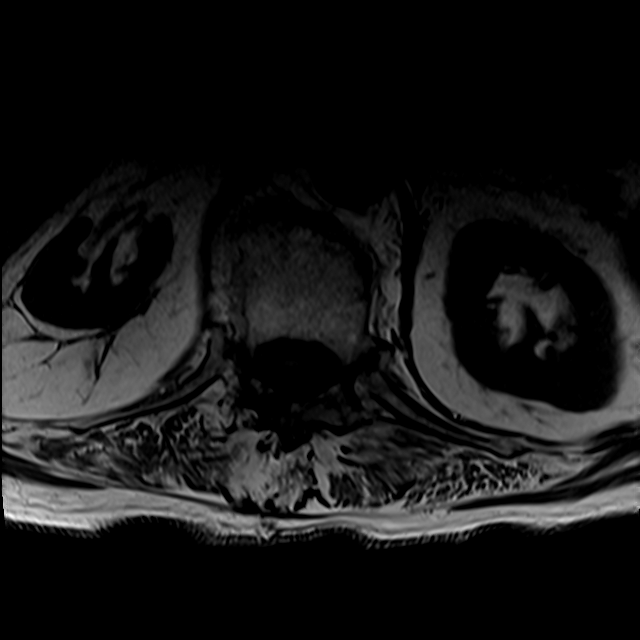

[25 of 48 positions shown; findings below may reference images not displayed]

FINDINGS: MRI THORACIC SPINE FINDINGS

Alignment:  Physiologic.

Vertebrae: There has been progressive destruction of the inferior
aspect of T10 and of the superior aspect of T11. There is prominent
fluid in the disc space. The inflammatory process extends into the
paraspinal soft tissues to the right and left and anteriorly at
T10-11. There is abnormal soft tissue anterior to the thecal sac at
T10 and at T11 which could represent epidural abscess.

Abnormal edema extends into the pedicles and posterior elements at
T10 and T11 with effusions at both facet joints. These combine to
compress the thecal sac and slightly compress the spinal cord at
T10-11 without discrete myelopathy.

Cord: The spinal cord is normal except for slight compression of the
spinal cord at T10-11 as described above. Tip of the conus is at
T12-L1.

Paraspinal and other soft tissues: Abnormal soft tissue surrounds
the anterior and lateral aspects of T10 and T11 likely representing
cellulitis and paraspinal abscess. The appearance is similar to that
seen on the prior CT scan of [DATE].

Disc levels:

T1-2 through T7-8: Negative.

T8-9: Chronic small broad-based disc bulge without neural
impingement, unchanged since the prior CT scan.

T9-10: Negative.

T10-11: Diffuse osteomyelitis and discitis and perispinal cellulitis
and abscess as described above. Bilateral facet effusions are likely
infected. The hypertrophied ligamentum flavum and facet joints
combine with bulging disc space and destroyed inferior endplate of
T1 and superior endplate of T11 combine to create slight spinal
stenosis and slight compression of the spinal cord without
myelopathy. Epidural soft tissue anteriorly at T10 and T11 likely a
represents abscess this was not apparent on the prior unenhanced CT
scan.

T11-12: Chronic disc space narrowing. Small disc protrusion and
annular fissure to the left of midline. No significant change since
the prior CT scan.

MRI LUMBAR SPINE FINDINGS

Segmentation:  Standard.

Alignment: Chronic 3 mm retrolisthesis of L1 on L2. Lumbar scoliosis
with convexity to the left centered at L2, stable.

Vertebrae:  The spine is fused posteriorly from T12 through S1.

Conus medullaris and cauda equina: Conus extends to the T12-L1
level. Conus and cauda equina appear normal.

Paraspinal and other soft tissues: Chronic severe atrophy of the
right kidney. The bladder is distended.

Disc levels:

T12-L1: Normal disc. Solid posterior fusion. No neural impingement.

L1-2: Chronic slight retrolisthesis with chronic disc space
narrowing. Posterior fusion. No neural impingement.

L2-3 through L5-S1: Solid chronic fusions at each level with no
neural impingement.
IMPRESSION: MR OF THE THORACIC SPINE IMPRESSION

1. Progressive osteomyelitis and persistent perispinal cellulitis
and abscess at T10-11 with a probable epidural abscess anterior to
the thecal sac at T10-11 and T11-12 which was not apparent on the
prior unenhanced CT scan of [DATE].
2. Slight compression of the thecal sac and thoracic spinal cord at
T10-11 as described above.
3. Chronic degenerative disc disease at T8-9 and T11-12 without
focal neural impingement.

MR LUMBAR SPINE IMPRESSION

No acute abnormalities of the lumbar spine. The lumbar spine is
fused from T12-S1.

## 2019-11-25 IMAGING — MR MR THORACIC SPINE W/O CM
4 of 6 series · 22 of 48 positions shown · non-contrast
Comparison: CT scans dated [DATE]

CLINICAL DATA: T10-11 discitis and osteomyelitis.

EXAM:
MRI THORACIC AND LUMBAR SPINE WITHOUT CONTRAST
TECHNIQUE: Multiplanar and multiecho pulse sequences of the thoracic and lumbar
spine were obtained without intravenous contrast.

[Series 17: T1 · sagittal · 3.0mm · 0.62mm/px · 3 of 9 slices shown (1 of 2)]
[im 1/9]
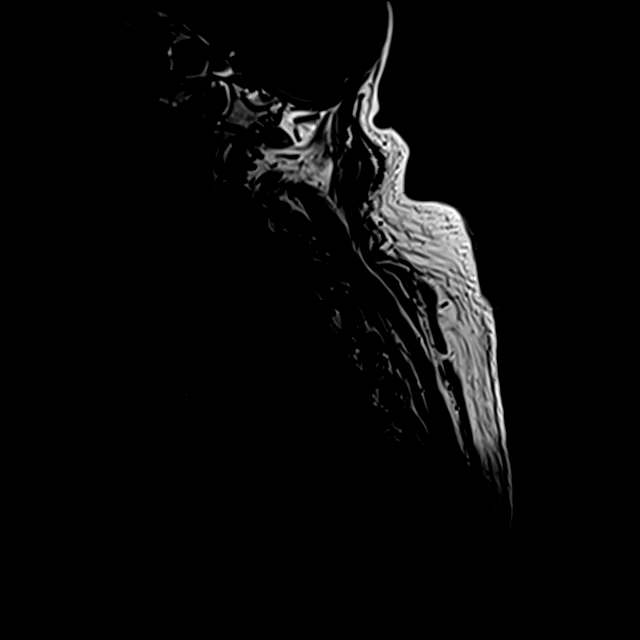
[im 5/9]
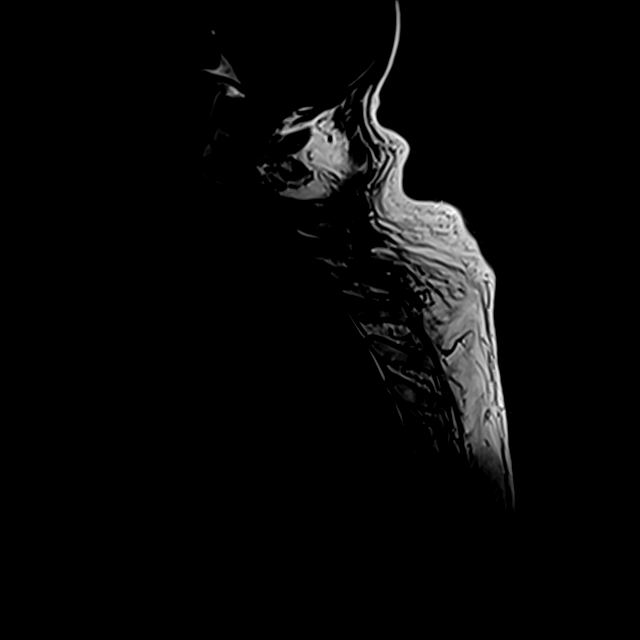
[im 9/9]
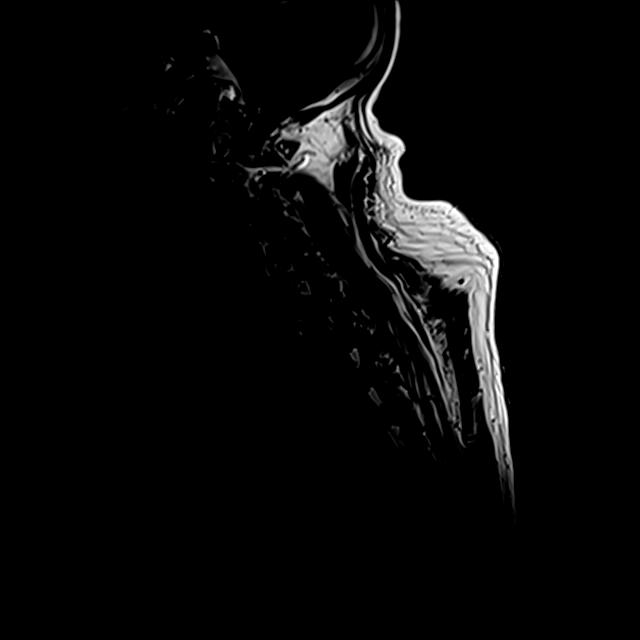

[Series 19: T2 · sagittal · 3.0mm · 0.80mm/px · 5 of 17 slices shown (1 of 2)]
[im 1/17]
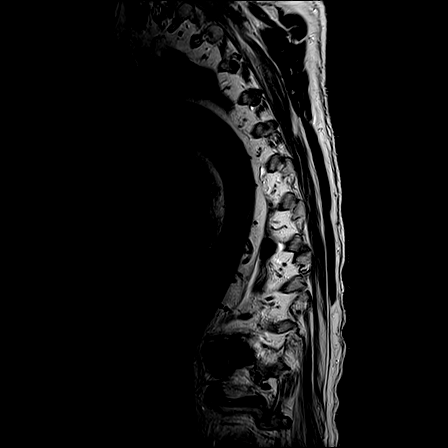
[im 5/17]
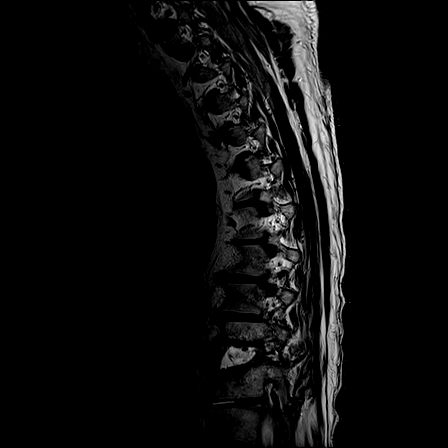
[im 9/17]
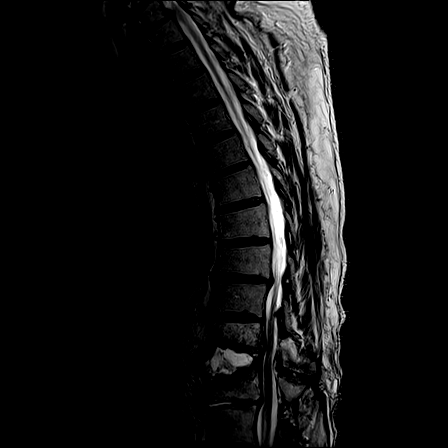
[im 13/17]
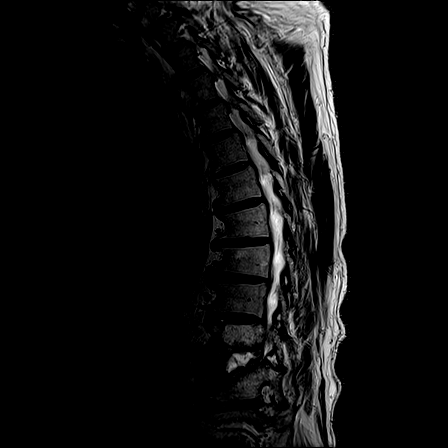
[im 17/17]
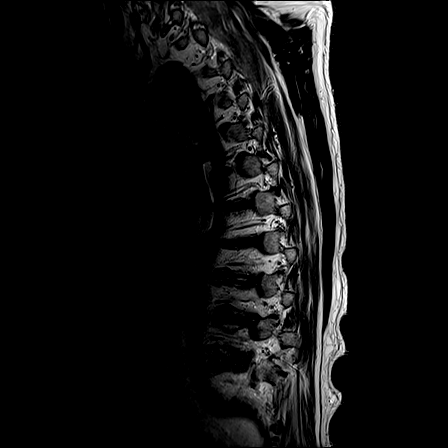

[Series 20: T1 · sagittal · 3.0mm · 0.80mm/px · 5 of 17 slices shown (2 of 2)]
[im 1/17]
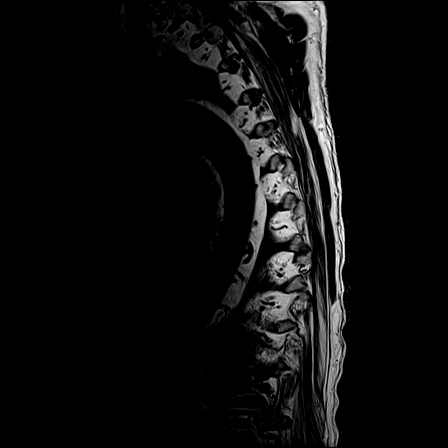
[im 5/17]
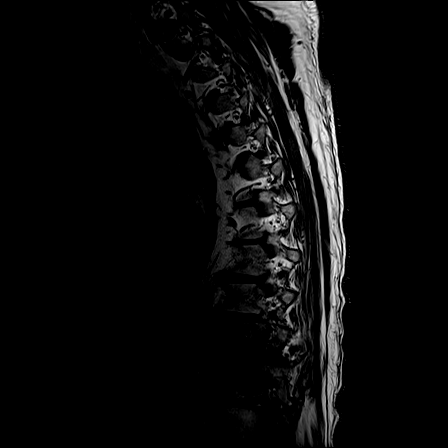
[im 9/17]
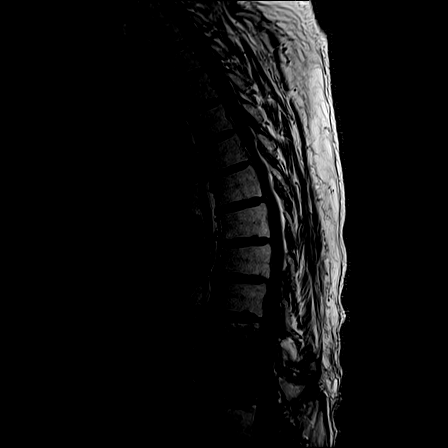
[im 13/17]
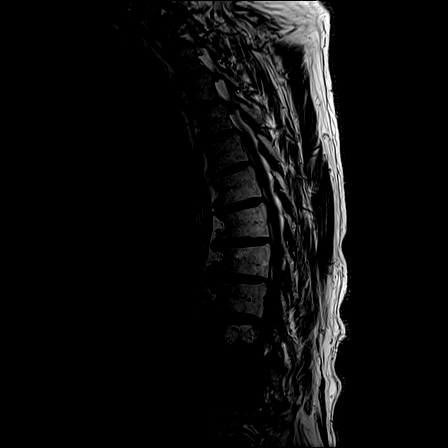
[im 17/17]
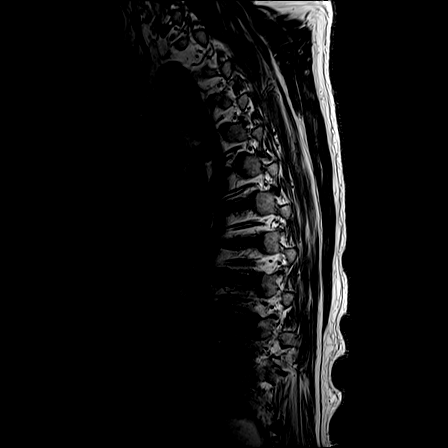

[Series 22: T2 · axial · 5.0mm · 0.59mm/px · z∈[-201,+122]mm · 9 of 46 slices shown (2 of 2)]
[im 1/46]
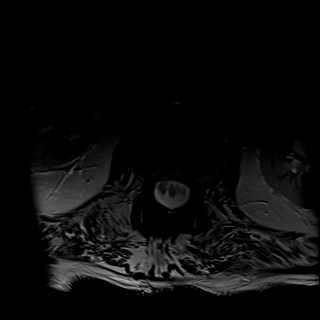
[im 7/46]
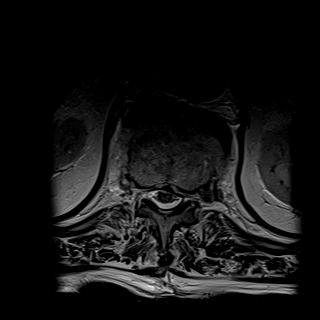
[im 13/46]
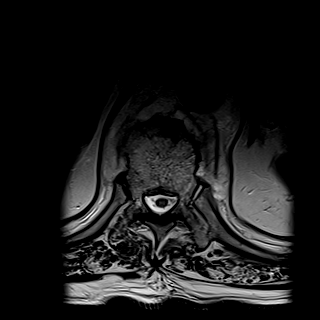
[im 20/46]
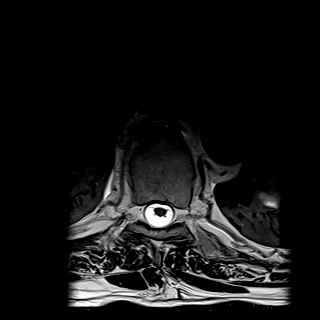
[im 23/46]
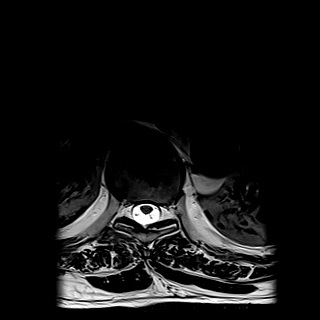
[im 26/46]
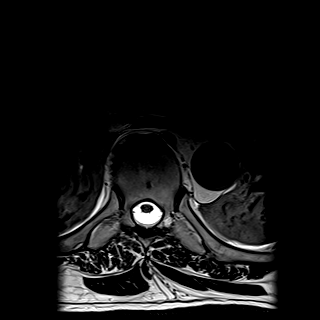
[im 33/46]
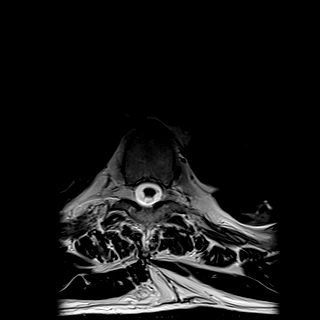
[im 39/46]
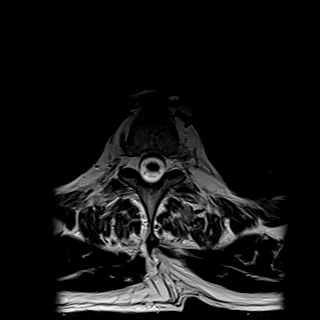
[im 46/46]
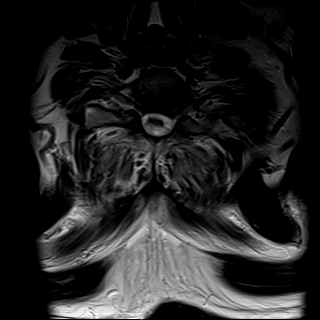

[22 of 48 positions shown; findings below may reference images not displayed]

FINDINGS: MRI THORACIC SPINE FINDINGS

Alignment:  Physiologic.

Vertebrae: There has been progressive destruction of the inferior
aspect of T10 and of the superior aspect of T11. There is prominent
fluid in the disc space. The inflammatory process extends into the
paraspinal soft tissues to the right and left and anteriorly at
T10-11. There is abnormal soft tissue anterior to the thecal sac at
T10 and at T11 which could represent epidural abscess.

Abnormal edema extends into the pedicles and posterior elements at
T10 and T11 with effusions at both facet joints. These combine to
compress the thecal sac and slightly compress the spinal cord at
T10-11 without discrete myelopathy.

Cord: The spinal cord is normal except for slight compression of the
spinal cord at T10-11 as described above. Tip of the conus is at
T12-L1.

Paraspinal and other soft tissues: Abnormal soft tissue surrounds
the anterior and lateral aspects of T10 and T11 likely representing
cellulitis and paraspinal abscess. The appearance is similar to that
seen on the prior CT scan of [DATE].

Disc levels:

T1-2 through T7-8: Negative.

T8-9: Chronic small broad-based disc bulge without neural
impingement, unchanged since the prior CT scan.

T9-10: Negative.

T10-11: Diffuse osteomyelitis and discitis and perispinal cellulitis
and abscess as described above. Bilateral facet effusions are likely
infected. The hypertrophied ligamentum flavum and facet joints
combine with bulging disc space and destroyed inferior endplate of
T1 and superior endplate of T11 combine to create slight spinal
stenosis and slight compression of the spinal cord without
myelopathy. Epidural soft tissue anteriorly at T10 and T11 likely a
represents abscess this was not apparent on the prior unenhanced CT
scan.

T11-12: Chronic disc space narrowing. Small disc protrusion and
annular fissure to the left of midline. No significant change since
the prior CT scan.

MRI LUMBAR SPINE FINDINGS

Segmentation:  Standard.

Alignment: Chronic 3 mm retrolisthesis of L1 on L2. Lumbar scoliosis
with convexity to the left centered at L2, stable.

Vertebrae:  The spine is fused posteriorly from T12 through S1.

Conus medullaris and cauda equina: Conus extends to the T12-L1
level. Conus and cauda equina appear normal.

Paraspinal and other soft tissues: Chronic severe atrophy of the
right kidney. The bladder is distended.

Disc levels:

T12-L1: Normal disc. Solid posterior fusion. No neural impingement.

L1-2: Chronic slight retrolisthesis with chronic disc space
narrowing. Posterior fusion. No neural impingement.

L2-3 through L5-S1: Solid chronic fusions at each level with no
neural impingement.
IMPRESSION: MR OF THE THORACIC SPINE IMPRESSION

1. Progressive osteomyelitis and persistent perispinal cellulitis
and abscess at T10-11 with a probable epidural abscess anterior to
the thecal sac at T10-11 and T11-12 which was not apparent on the
prior unenhanced CT scan of [DATE].
2. Slight compression of the thecal sac and thoracic spinal cord at
T10-11 as described above.
3. Chronic degenerative disc disease at T8-9 and T11-12 without
focal neural impingement.

MR LUMBAR SPINE IMPRESSION

No acute abnormalities of the lumbar spine. The lumbar spine is
fused from T12-S1.

## 2019-11-25 SURGERY — ESOPHAGOGASTRODUODENOSCOPY (EGD) WITH PROPOFOL
Anesthesia: General

## 2019-11-25 SURGERY — MRI WITH ANESTHESIA
Anesthesia: General

## 2019-11-25 MED ORDER — HYDROMORPHONE HCL 1 MG/ML IJ SOLN
0.5000 mg | INTRAMUSCULAR | Status: DC | PRN
  Administered 2019-11-25 – 2019-11-29 (×15): 0.5 mg via INTRAVENOUS
  Filled 2019-11-25 (×15): qty 1

## 2019-11-25 MED ORDER — FENTANYL CITRATE (PF) 100 MCG/2ML IJ SOLN
INTRAMUSCULAR | Status: AC
  Filled 2019-11-25: qty 2

## 2019-11-25 MED ORDER — OXYCODONE HCL 5 MG PO TABS
10.0000 mg | ORAL_TABLET | ORAL | Status: DC | PRN
  Administered 2019-11-25 – 2019-12-16 (×12): 10 mg via ORAL
  Filled 2019-11-25 (×17): qty 2

## 2019-11-25 MED ORDER — SUCCINYLCHOLINE CHLORIDE 200 MG/10ML IV SOSY
PREFILLED_SYRINGE | INTRAVENOUS | Status: DC | PRN
  Administered 2019-11-25: 160 mg via INTRAVENOUS

## 2019-11-25 MED ORDER — PROPOFOL 10 MG/ML IV BOLUS
INTRAVENOUS | Status: DC | PRN
  Administered 2019-11-25: 200 mg via INTRAVENOUS

## 2019-11-25 MED ORDER — OXYCODONE HCL ER 15 MG PO T12A
30.0000 mg | EXTENDED_RELEASE_TABLET | Freq: Two times a day (BID) | ORAL | Status: DC
  Administered 2019-11-25: 30 mg via ORAL
  Filled 2019-11-25: qty 2

## 2019-11-25 MED ORDER — ONDANSETRON HCL 4 MG/2ML IJ SOLN
INTRAMUSCULAR | Status: DC | PRN
  Administered 2019-11-25: 4 mg via INTRAVENOUS

## 2019-11-25 MED ORDER — ROCURONIUM BROMIDE 50 MG/5ML IV SOSY
PREFILLED_SYRINGE | INTRAVENOUS | Status: DC | PRN
  Administered 2019-11-25: 50 mg via INTRAVENOUS

## 2019-11-25 MED ORDER — LIDOCAINE 2% (20 MG/ML) 5 ML SYRINGE
INTRAMUSCULAR | Status: DC | PRN
  Administered 2019-11-25: 100 mg via INTRAVENOUS

## 2019-11-25 MED ORDER — OXYCODONE HCL 5 MG PO TABS: 10.0000 mg | ORAL_TABLET | ORAL | Status: DC | PRN

## 2019-11-25 MED ORDER — OXYCODONE HCL ER 10 MG PO T12A
30.0000 mg | EXTENDED_RELEASE_TABLET | Freq: Three times a day (TID) | ORAL | Status: DC
  Administered 2019-11-25 – 2019-12-17 (×62): 30 mg via ORAL
  Filled 2019-11-25 (×8): qty 3
  Filled 2019-11-25 (×2): qty 2
  Filled 2019-11-25 (×2): qty 3
  Filled 2019-11-25: qty 2
  Filled 2019-11-25 (×7): qty 3
  Filled 2019-11-25: qty 2
  Filled 2019-11-25 (×7): qty 3
  Filled 2019-11-25 (×2): qty 2
  Filled 2019-11-25: qty 3
  Filled 2019-11-25: qty 2
  Filled 2019-11-25 (×2): qty 3
  Filled 2019-11-25: qty 2
  Filled 2019-11-25 (×8): qty 3
  Filled 2019-11-25: qty 2
  Filled 2019-11-25 (×5): qty 3
  Filled 2019-11-25 (×3): qty 2
  Filled 2019-11-25 (×2): qty 3
  Filled 2019-11-25: qty 2
  Filled 2019-11-25 (×8): qty 3
  Filled 2019-11-25: qty 2
  Filled 2019-11-25 (×2): qty 3

## 2019-11-25 MED ORDER — EPHEDRINE SULFATE-NACL 50-0.9 MG/10ML-% IV SOSY
PREFILLED_SYRINGE | INTRAVENOUS | Status: DC | PRN
  Administered 2019-11-25: 20 mg via INTRAVENOUS
  Administered 2019-11-25: 10 mg via INTRAVENOUS

## 2019-11-25 MED ORDER — FENTANYL CITRATE (PF) 100 MCG/2ML IJ SOLN
INTRAMUSCULAR | Status: DC | PRN
  Administered 2019-11-25 (×2): 50 ug via INTRAVENOUS

## 2019-11-25 MED ORDER — SODIUM CHLORIDE 0.9 % IV SOLN
INTRAVENOUS | Status: AC | PRN
  Administered 2019-11-25: 500 mL via INTRAMUSCULAR

## 2019-11-25 MED ORDER — OXYCODONE HCL 5 MG PO TABS
ORAL_TABLET | ORAL | Status: AC
  Filled 2019-11-25: qty 2

## 2019-11-25 SURGICAL SUPPLY — 14 items

## 2019-11-25 NOTE — Anesthesia Preprocedure Evaluation (Signed)
Anesthesia Evaluation  Patient identified by MRN, date of birth, ID band Patient awake    Reviewed: Allergy & Precautions, NPO status , Patient's Chart, lab work & pertinent test results, reviewed documented beta blocker date and time   Airway Mallampati: I       Dental  (+) Edentulous Upper, Edentulous Lower   Pulmonary neg pulmonary ROS,    Pulmonary exam normal breath sounds clear to auscultation       Cardiovascular hypertension, Pt. on medications and Pt. on home beta blockers Normal cardiovascular exam Rhythm:Regular Rate:Normal     Neuro/Psych negative neurological ROS  negative psych ROS   GI/Hepatic   Endo/Other  diabetes, Type 2, Oral Hypoglycemic Agents  Renal/GU Renal InsufficiencyRenal disease CKD IV      Musculoskeletal   Abdominal Normal abdominal exam  (+)   Peds  Hematology  (+) Blood dyscrasia, anemia ,   Anesthesia Other Findings   Reproductive/Obstetrics                             Anesthesia Physical Anesthesia Plan  ASA: II  Anesthesia Plan: General   Post-op Pain Management:    Induction: Intravenous  PONV Risk Score and Plan: 2 and Ondansetron and Treatment may vary due to age or medical condition  Airway Management Planned: Oral ETT  Additional Equipment: None  Intra-op Plan:   Post-operative Plan: Extubation in OR  Informed Consent: I have reviewed the patients History and Physical, chart, labs and discussed the procedure including the risks, benefits and alternatives for the proposed anesthesia with the patient or authorized representative who has indicated his/her understanding and acceptance.     Dental advisory given  Plan Discussed with: CRNA  Anesthesia Plan Comments:         Anesthesia Quick Evaluation

## 2019-11-25 NOTE — Addendum Note (Signed)
Addendum  created 11/25/19 1431 by Josephine Igo, CRNA   Attached Procedures edited

## 2019-11-25 NOTE — Transfer of Care (Signed)
Immediate Anesthesia Transfer of Care Note  Patient: Thomas [name]  Procedure(s) Performed: ESOPHAGOGASTRODUODENOSCOPY (EGD) WITH PROPOFOL (N/A ) Thoracic and Lumbar MRI WITH ANESTHESIA (N/A )  Patient Location: MRI  Anesthesia Type:General  Level of Consciousness: sedated and Patient remains intubated per anesthesia plan  Airway & Oxygen Therapy: Patient remains intubated per anesthesia plan and Patient placed on Ventilator (see vital sign flow sheet for setting)  Post-op Assessment: Report given to RN and Post -op Vital signs reviewed and stable  Post vital signs: Reviewed and stable  Last Vitals:  Vitals Value Taken Time  BP    Temp    Pulse    Resp    SpO2      Last Pain:  Vitals:   11/25/19 1035  TempSrc: Temporal  PainSc: 9       Patients Stated Pain Goal: 2 (Q000111Q [id number])  Complications: No apparent anesthesia complications

## 2019-11-25 NOTE — Progress Notes (Signed)
[facility]  Date of Admission:  11/24/2019      Total days of antibiotics 0    ASSESSMENT: Thomas [name] is a 69 y.o. 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 He has also had a mechanical fall at home that may have complicated things.   Appreciate Dr. Melven Sartorius expertise - awaiting MRI for further clarification of epidural space to ensure no abscess here. 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 At present he is stable hemodynamically and was not bacteremic. He did have a fever 2/04 to 100.4 F once.   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  Otherwise will follow for now off antibiotics.     PLAN: 1. Follow MRI (needs anesthesia)  2. Follow blood cultures 2/4  3. Hold on antibiotics for now.     Principal Problem:   Upper GI bleeding Active Problems:   [name], type 2) (HCC)   Gout   HTN (hypertension)   Discitis   Pressure injury of skin   BPH (benign prostatic hyperplasia)   CKD (chronic kidney disease), stage IV (HCC)   Chronic pain syndrome   Physical deconditioning   . allopurinol  100 mg Oral BID  . amLODipine  10 mg Oral Daily  . cloNIDine  0.3 mg Oral TID  . feeding supplement  1 Container Oral BID BM  . finasteride  5 mg Oral Daily  . hydrALAZINE  50 mg Oral Q8H  . insulin aspart  0-15 Units Subcutaneous TID WC  . insulin aspart  0-5 Units Subcutaneous QHS  . levothyroxine  50 mcg Oral Q0600  . metoprolol tartrate  50 mg Oral BID  . nutrition supplement (JUVEN)  1 packet Oral BID BM  . oxyCODONE  30 mg Oral Q12H  . sodium chloride flush  3 mL Intravenous Q12H  . tamsulosin  0.4 mg Oral Daily    SUBJECTIVE: EGD planned today. Planning MRI with anesthesia soon to evaluate thoracic epidural space.    No other complaints today - pain is under better control but still limits straight leg raise on the right. No limitation on the left. Clarifies that his back pain resumed 2 weeks after last dose of IV dalbavancin and that it had resolved during treatment.     Review of Systems: Review of Systems  Constitutional: Negative for chills and fever.  Respiratory: Negative for cough and shortness of breath.   Cardiovascular: Negative for chest pain.  Gastrointestinal: Negative for abdominal pain and vomiting.  Genitourinary: Negative for dysuria and hematuria.  Musculoskeletal: Positive for back pain.  Skin: Negative for rash.  Neurological: Negative for dizziness and headaches.    Allergies  Allergen Reactions  . Other Other (See Comments)    Blood pressure issues Per [facility] hospital: Pt states he can only take these pain meds or else he gets very sick: Oxycontin,morphine,demerol, and dilaudid are the only pain meds pt states he can take.    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 2.4.2021: He also reports that he currently take Oxycontin.   Marland Kitchen Oxymorphone Other (See Comments)    Causes kidney problems  . Aspirin Nausea And Vomiting    Per [facility]  . Beta  Vulgaris Nausea And Vomiting    Beets  . Buspirone Other (See Comments)    unknown  . Cabbage Nausea And Vomiting  . Codeine Other (See Comments)    unknown  . Fish Allergy Nausea And Vomiting  . Fish-Derived Products Nausea And Vomiting  . Pentazocine Other (See Comments)    unknown  . Propoxyphene Other (See Comments)    unknown  . Shellfish Allergy Nausea And Vomiting  . Sulfa Antibiotics Hives  . Sulfasalazine Other (See Comments)  . Amoxicillin Rash    Per [facility]  . Apap [Acetaminophen] Rash    OBJECTIVE: Vitals:   11/24/19 1325 11/24/19 1700 11/24/19 2113 11/25/19 0448  BP: (!) 178/123 (!) 146/90 (!) 156/98 (!) 177/96   Pulse: (!) 116 (!) 121 (!) 123 68  Resp: 18  18 16   Temp: 98.6 F (37 C) 98.8 F (37.1 C) (!) 100.4 F (38 C) 99.5 F (37.5 C)  TempSrc: Oral Oral Oral Oral  SpO2: 97% 97% 95% 99%  Weight:   109.1 kg   Height:       Body mass index is 27.8 kg/m.  Physical Exam Constitutional:      Appearance: Normal appearance. He is not ill-appearing.  HENT:     Mouth/Throat:     Mouth: Mucous membranes are moist.     Pharynx: Oropharynx is clear.  Eyes:     General: No scleral icterus.    Pupils: Pupils are equal, round, and reactive to light.  Cardiovascular:     Rate and Rhythm: Normal rate and regular rhythm.  Pulmonary:     Effort: Pulmonary effort is normal.  Abdominal:     General: Abdomen is flat. Bowel sounds are normal. There is no distension.  Musculoskeletal:     Comments: Tries to raise right leg off bed but pain limits complete movement but he can move limb. Left leg raises without restriction   Skin:    General: Skin is warm and dry.     Capillary Refill: Capillary refill takes less than 2 seconds.  Neurological:     Mental Status: He is alert and oriented to person, place, and time.     Lab Results Lab Results  Component Value Date   WBC 5.7 11/25/2019   HGB 9.5 (L) 11/25/2019   HCT 30.7 (L) 11/25/2019   MCV 100.7 (H) 11/25/2019   PLT 160 11/25/2019    Lab Results  Component Value Date   CREATININE 2.83 (H) 11/25/2019   BUN 44 (H) 11/25/2019   NA 143 11/25/2019   K 4.3 11/25/2019   CL 105 11/25/2019   CO2 28 11/25/2019    Lab Results  Component Value Date   ALT 13 11/24/2019   AST 27 11/24/2019   ALKPHOS 75 11/24/2019   BILITOT 0.7 11/24/2019     Microbiology: Recent Results (from the past 240 hour(s))  MRSA PCR Screening     Status: None   Collection Time: 11/24/19  2:59 PM   Specimen: Nasopharyngeal  Result Value Ref Range Status   MRSA by PCR NEGATIVE NEGATIVE Final    Comment:        The GeneXpert MRSA Assay (FDA approved for NASAL  specimens only), is one component of a comprehensive MRSA colonization surveillance program. It is not intended to diagnose MRSA infection nor to guide or monitor treatment for MRSA infections. Performed at [facility], [place] [address], [place], [place] [address]     Janene Madeira, MSN, NP-C [facility] [email or web address] Pager: [phone] Office: [phone] [place]: [phone]

## 2019-11-25 NOTE — Transfer of Care (Signed)
Immediate Anesthesia Transfer of Care Note  Patient: Thomas [name]  Procedure(s) Performed: Thoracic and Lumbar MRI WITH ANESTHESIA (N/A )  Patient Location: PACU  Anesthesia Type:General  Level of Consciousness: drowsy and patient cooperative  Airway & Oxygen Therapy: Patient Spontanous Breathing and Patient connected to nasal cannula oxygen  Post-op Assessment: Report given to RN, Post -op Vital signs reviewed and stable and Patient moving all extremities  Post vital signs: Reviewed and stable  Last Vitals:  Vitals Value Taken Time  BP 155/85 11/25/19 1424  Temp 37.1 C 11/25/19 1424  Pulse 91 11/25/19 1424  Resp 16 11/25/19 1424  SpO2 96 % 11/25/19 1424    Last Pain:  Vitals:   11/25/19 1424  TempSrc:   PainSc: 9       Patients Stated Pain Goal: 2 (Q000111Q [id number])  Complications: No apparent anesthesia complications

## 2019-11-25 NOTE — Op Note (Signed)
[facility] Patient Name: Thomas [name] Procedure Date : 11/25/2019 MRN: [id number] Attending MD: Carol Ada , MD Date of Birth: 06/11/1951 CSN: [id number] Age: 69 Admit Type: Inpatient Procedure:                Upper GI endoscopy Indications:              Coffee-ground emesis Providers:                Carol Ada, MD, Grace Isaac, RN, William Dalton, Technician, Theodora Blow, Technician Referring MD:              Medicines:                General Anesthesia Complications:            No immediate complications. Estimated Blood Loss:     Estimated blood loss: none. Procedure:                Pre-Anesthesia Assessment:                           - Prior to the procedure, a History and Physical                            was performed, and patient medications and                            allergies were reviewed. The patient's tolerance of                            previous anesthesia was also reviewed. The risks                            and benefits of the procedure and the sedation                            options and risks were discussed with the patient.                            All questions were answered, and informed consent                            was obtained. Prior Anticoagulants: The patient has                            taken no previous anticoagulant or antiplatelet                            agents. ASA Grade Assessment: III - A patient with                            severe systemic disease. After reviewing the risks  and benefits, the patient was deemed in                            satisfactory condition to undergo the procedure.                           - Sedation was administered by an anesthesia                            professional. Deep sedation was attained.                           After obtaining informed consent, the endoscope was                            passed under direct  vision. Throughout the                            procedure, the patient's blood pressure, pulse, and                            oxygen saturations were monitored continuously. The                            [id number]) Olympus gastroscope was                            introduced through the mouth, and advanced to the                            second part of duodenum. The upper GI endoscopy was                            accomplished without difficulty. The patient                            tolerated the procedure well. Scope In: Scope Out: Findings:      LA Grade A (one or more mucosal breaks less than 5 mm, not extending       between tops of 2 mucosal folds) esophagitis with no bleeding was found       at the gastroesophageal junction.      The stomach was normal.      The examined duodenum was normal. Impression:               - LA Grade A reflux esophagitis with no bleeding.                           - Normal stomach.                           - Normal examined duodenum.                           - No specimens collected. Recommendation:           -  Return patient to hospital ward for ongoing care.                           - Resume regular diet.                           - Continue present medications. Procedure Code(s):        --- Professional ---                           [phone], Esophagogastroduodenoscopy, flexible,                            transoral; diagnostic, including collection of                            specimen(s) by brushing or washing, when performed                            (separate procedure) Diagnosis Code(s):        --- Professional ---                           K21.00, Gastro-esophageal reflux disease with                            esophagitis, without bleeding                           K92.0, Hematemesis CPT copyright 2019 American Medical Association. All rights reserved. The codes documented in this report are preliminary and upon coder  review may  be revised to meet current compliance requirements. Carol Ada, MD Carol Ada, MD 11/25/2019 12:36:09 PM This report has been signed electronically. Number of Addenda: 0

## 2019-11-25 NOTE — Anesthesia Procedure Notes (Signed)
Procedure Name: Intubation Date/Time: 11/25/2019 12:09 PM Performed by: Lance Coon, CRNA Pre-anesthesia Checklist: Patient identified, Emergency Drugs available, Suction available, Patient being monitored and Timeout performed Patient Re-evaluated:Patient Re-evaluated prior to induction Oxygen Delivery Method: Circle system utilized Preoxygenation: Pre-oxygenation with 100% oxygen Induction Type: IV induction Ventilation: Mask ventilation without difficulty Laryngoscope Size: Miller and 2 Grade View: Grade I Tube type: Oral Tube size: 7.5 mm Number of attempts: 1 Airway Equipment and Method: Stylet Placement Confirmation: ETT inserted through vocal cords under direct vision,  positive ETCO2 and breath sounds checked- equal and bilateral Secured at: 23 cm Tube secured with: Tape Dental Injury: Teeth and Oropharynx as per pre-operative assessment

## 2019-11-25 NOTE — Anesthesia Postprocedure Evaluation (Signed)
Anesthesia Post Note  Patient: Thomas [name]  Procedure(s) Performed: ESOPHAGOGASTRODUODENOSCOPY (EGD) WITH PROPOFOL (N/A ) Thoracic and Lumbar MRI WITH ANESTHESIA (N/A )     Anesthesia Type: General Level of consciousness: awake Pain management: pain level controlled Vital Signs Assessment: post-procedure vital signs reviewed and stable Respiratory status: spontaneous breathing Cardiovascular status: stable Postop Assessment: no apparent nausea or vomiting Anesthetic complications: no    Last Vitals:  Vitals:   11/25/19 1035 11/25/19 1105  BP: (!) 204/107 (!) 189/104  Pulse: 82   Resp: 14   Temp: (!) 36.3 C   SpO2:      Last Pain:  Vitals:   11/25/19 1035  TempSrc: Temporal  PainSc: 9    Pain Goal: Patients Stated Pain Goal: 2 (11/25/19 0245)                 Huston Foley

## 2019-11-26 ENCOUNTER — Inpatient Hospital Stay (HOSPITAL_COMMUNITY): Payer: Medicare Other

## 2019-11-26 HISTORY — PX: IR LUMBAR DISC ASPIRATION W/IMG GUIDE: IMG5306

## 2019-11-26 LAB — PROTIME-INR
INR: 1.2 (ref 0.8–1.2)
Prothrombin Time: 14.9 seconds (ref 11.4–15.2)

## 2019-11-26 LAB — COMPREHENSIVE METABOLIC PANEL
ALT: 15 U/L (ref 0–44)
AST: 29 U/L (ref 15–41)
Albumin: 2.6 g/dL — ABNORMAL LOW (ref 3.5–5.0)
Alkaline Phosphatase: 55 U/L (ref 38–126)
Anion gap: 11 (ref 5–15)
BUN: 39 mg/dL — ABNORMAL HIGH (ref 8–23)
CO2: 26 mmol/L (ref 22–32)
Calcium: 8.3 mg/dL — ABNORMAL LOW (ref 8.9–10.3)
Chloride: 104 mmol/L (ref 98–111)
Creatinine, Ser: 2.67 mg/dL — ABNORMAL HIGH (ref 0.61–1.24)
GFR calc Af Amer: 28 mL/min — ABNORMAL LOW (ref >60)
GFR calc non Af Amer: 24 mL/min — ABNORMAL LOW (ref >60)
Glucose, Bld: 152 mg/dL — ABNORMAL HIGH (ref 70–99)
Potassium: 3.7 mmol/L (ref 3.5–5.1)
Sodium: 141 mmol/L (ref 135–145)
Total Bilirubin: 0.5 mg/dL (ref 0.3–1.2)
Total Protein: 6.6 g/dL (ref 6.5–8.1)

## 2019-11-26 LAB — CBC WITH DIFFERENTIAL/PLATELET
Abs Immature Granulocytes: 0.02 10*3/uL (ref 0.00–0.07)
Basophils Absolute: 0 10*3/uL (ref 0.0–0.1)
Basophils Relative: 1 %
Eosinophils Absolute: 0.1 10*3/uL (ref 0.0–0.5)
Eosinophils Relative: 2 %
HCT: 28.7 % — ABNORMAL LOW (ref 39.0–52.0)
Hemoglobin: 8.9 g/dL — ABNORMAL LOW (ref 13.0–17.0)
Immature Granulocytes: 1 %
Lymphocytes Relative: 12 %
Lymphs Abs: 0.4 10*3/uL — ABNORMAL LOW (ref 0.7–4.0)
MCH: 30.6 pg (ref 26.0–34.0)
MCHC: 31 g/dL (ref 30.0–36.0)
MCV: 98.6 fL (ref 80.0–100.0)
Monocytes Absolute: 0.3 10*3/uL (ref 0.1–1.0)
Monocytes Relative: 8 %
Neutro Abs: 3 10*3/uL (ref 1.7–7.7)
Neutrophils Relative %: 76 %
Platelets: 125 10*3/uL — ABNORMAL LOW (ref 150–400)
RBC: 2.91 MIL/uL — ABNORMAL LOW (ref 4.22–5.81)
RDW: 14.8 % (ref 11.5–15.5)
WBC: 3.8 10*3/uL — ABNORMAL LOW (ref 4.0–10.5)
nRBC: 0 % (ref 0.0–0.2)

## 2019-11-26 LAB — GLUCOSE, CAPILLARY
Glucose-Capillary: 140 mg/dL — ABNORMAL HIGH (ref 70–99)
Glucose-Capillary: 117 mg/dL — ABNORMAL HIGH (ref 70–99)
Glucose-Capillary: 122 mg/dL — ABNORMAL HIGH (ref 70–99)
Glucose-Capillary: 142 mg/dL — ABNORMAL HIGH (ref 70–99)

## 2019-11-26 MED ORDER — FENTANYL CITRATE (PF) 100 MCG/2ML IJ SOLN
INTRAMUSCULAR | Status: AC | PRN
  Administered 2019-11-26 (×2): 50 ug via INTRAVENOUS

## 2019-11-26 MED ORDER — SODIUM CHLORIDE 0.9 % IV SOLN
1000.0000 mg | INTRAVENOUS | Status: DC
  Administered 2019-11-26 – 2019-12-03 (×8): 1000 mg via INTRAVENOUS
  Filled 2019-11-26 (×10): qty 20

## 2019-11-26 MED ORDER — MIDAZOLAM HCL 2 MG/2ML IJ SOLN
INTRAMUSCULAR | Status: AC | PRN
  Administered 2019-11-26 (×2): 1 mg via INTRAVENOUS

## 2019-11-26 MED ORDER — SODIUM CHLORIDE 0.9 % IV SOLN
INTRAVENOUS | Status: DC | PRN
  Administered 2019-11-26: 500 mL via INTRAVENOUS

## 2019-11-26 MED ORDER — LIDOCAINE HCL 1 % IJ SOLN
INTRAMUSCULAR | Status: AC
  Filled 2019-11-26: qty 20

## 2019-11-26 MED ORDER — LIDOCAINE HCL 1 % IJ SOLN
INTRAMUSCULAR | Status: AC | PRN
  Administered 2019-11-26: 10 mL

## 2019-11-26 MED ORDER — FENTANYL CITRATE (PF) 100 MCG/2ML IJ SOLN
INTRAMUSCULAR | Status: AC
  Filled 2019-11-26: qty 4

## 2019-11-26 MED ORDER — MIDAZOLAM HCL 2 MG/2ML IJ SOLN
INTRAMUSCULAR | Status: AC
  Filled 2019-11-26: qty 6

## 2019-11-26 MED ORDER — BISACODYL 10 MG RE SUPP
10.0000 mg | Freq: Once | RECTAL | Status: AC
  Administered 2019-11-26: 10 mg via RECTAL
  Filled 2019-11-26: qty 1

## 2019-11-26 NOTE — Progress Notes (Signed)
Pharmacy Antibiotic Note  Thomas [name] is a 69 y.o. 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 Was last on daptomycin 08/31/19 >> 10/09/19 then discharged on dalbavancin Qweek until around 10/27/19. Pharmacy has been consulted for daptomycin dosing.  Per ID NP, start daptomycin after IR drainage of abscess. Dr. Posey Pronto confirmed this was done today. Patient with CKD, renal function may be worsening but is slightly better than in Dec 2020 when he was last on daptomycin. ClCr ~ 37.   Plan: Start daptomycin 1000mg  Q24 hr Monitor CK and creatinine at least QMon, more frequent if unstable  Monitor clinical status, renal fx and f/u duration    Height: 6\' 6"  (198.1 cm) Weight: 240 lb 4.8 oz (109 kg) IBW/kg (Calculated) : 91.4  Temp (24hrs), Avg:99.2 F (37.3 C), Min:98.5 F (36.9 C), Max:100.5 F (38.1 C)  Recent Labs  Lab 11/24/19 0614 11/24/19 1100 11/25/19 0420 11/26/19 0754  WBC 10.1 8.5 5.7 3.8*  CREATININE 2.73*  --  2.83* 2.67*    Estimated Creatinine Clearance: 35.2 mL/min (A) (by C-G formula based on SCr of 2.67 mg/dL (H)).     Antimicrobials this admission: Daptomycin 2/6 >>    Microbiology results: 2/4 BCx: NGTD 2/4 MRSA PCR: neg 11/11 covid neg   Benetta Spar, PharmD, BCPS, BCCP Clinical Pharmacist  Please check AMION for all [place] phone numbers After 10:00 PM, call [place] [phone]

## 2019-11-26 NOTE — Progress Notes (Signed)
[facility] Progress Note  Patient: Thomas [name]    [id number]  DOA: 11/24/2019     Date of Service: the patient was seen and examined on 11/25/2019  Chief complaint. Back pain.  Brief hospital course: Thomas [name] is a 69 y.o. male with medical history significant of CVA, hypertension, hyperlipidemia, diabetes, gout, DM, PAD status post right BKA, chronic anemia, chronic back pain who was admitted on 1/31 to [facility] after worsening ambulatory dysfunction and fall unable to get up on his own with prolonged down time.  He was found to have a LLE wound/cellulitis with concern for outpatient treatment failure with Clindamycin.  He was treated with IV Dapto (unable to take Vanc) with plan to transition to Linezolid at d/c (due to h/o MRSA bacteremia/diskitis in 10/2019).  In the evening of 2/3, he developed abdominal pain with n/v with coffee ground emesis that was gastroccult positive and so he was transferred to [facility] for GI evaluation.  Presented with severe back pain, nausea and vomiting.  He was previously admitted at [facility] from 11/11-12/20 with T10-11 discitis and MRSA bacteremia.  He was recommended to have IV Daptomycin as inpatient and transition to Dalbavancin at the time of d/c to home.  He was discharged with home health. Found to have epidural abscess likely from his previous MRSA infection. Underwent IR guided drainage. Currently further plan is further work-up with IR guided drainage  Assessment and Plan: 1.  Epidural abscess. MRSA bacteremia. T10-T11 discitis. Infectious disease consulted. Initially treated with daptomycin followed by Dalvance. MRI this admission shows evidence of epidural abscess. Appreciate neurosurgical consultation as well. IR consulted for drainage.  Likely scheduled tomorrow.  2.  Hematemesis. H&H remained stable. SP upper GI endoscopy by Dr. Benson Norway. No evidence of acute abnormality. Continue PPI patient is back on regular diet.  Avoid NSAIDs. DVT  prophylaxis starting tomorrow with heparin  3.  Bilateral lower extremity ulcers Follow-up on ABI.  4.  BPH Continue Flomax and Proscar  5.  Chronic kidney disease stage IV Renal function stable. Monitor. Avoid nephrotoxic medications Avoid MRI with contrast  6.  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 Currently on sliding scale insulin.  7.  Essential hypertension Blood pressure currently stable. Continue home regimen.  8.  Chronic pain syndrome. Patient is on chronic oxycodone 30 mg 3 times daily as well as Zanaflex. Which I will continue. Oxycodone 10 mg every 4 hours as needed added as well. For now Dilaudid 0.5 mg IV for severe pain.  9.  History of gout. Continue allopurinol.  10 left heel and foot unstageable ulcer.  POA. Dressing changes per wound care.  Pressure Injury 11/24/19 Heel Left Unstageable - Full thickness tissue loss in which the base of the injury is covered by slough (yellow, tan, gray, green or brown) and/or eschar (tan, brown or black) in the wound bed. (Active)  11/24/19 0655  Location: Heel  Location Orientation: Left  Staging: Unstageable - Full thickness tissue loss in which the base of the injury is covered by slough (yellow, tan, gray, green or brown) and/or eschar (tan, brown or black) in the wound bed.  Wound Description (Comments):   Present on Admission: Yes     Pressure Injury 09/02/19 Foot Left Unstageable - Full thickness tissue loss in which the base of the ulcer is covered by slough (yellow, tan, gray, green or brown) and/or eschar (tan, brown or black) in the wound bed. (Active)  09/02/19 [id number]  Location: Foot  Location Orientation: Left  Staging: Unstageable - Full thickness tissue loss in which the base of the ulcer is covered by slough (yellow, tan, gray, green or brown) and/or eschar (tan, brown or black) in the wound bed.  Wound Description (Comments):   Present on  Admission: Yes     Diet: cardiac and carb modified diet DVT Prophylaxis: SCD, pharmacological prophylaxis contraindicated due to Upper GI bleed   Advance goals of care discussion: Full code  Family Communication: no family was present at bedside, at the time of interview.   Disposition:  Pt is from home, admitted with back pain found to have epidural abscess, still has pending cultures, which precludes a safe discharge. Discharge to home versus SNF, when medically stable.  Subjective: Continues to have back pain and nausea no vomiting.  No abdominal pain.  No fever no chills.  Tolerated MRI with anesthesia.  Physical Exam: General:  alert oriented to time, place, and person.  Appear in mild distress, affect appropriate Eyes: PERRL ENT: Oral Mucosa Clear, moist  Neck: no JVD,  Cardiovascular: S1 and S2 Present, no Murmur,  Respiratory: good respiratory effort, Bilateral Air entry equal and Decreased, no Crackles, no wheezes Abdomen: Bowel Sound present, Soft and no tenderness,  Skin: no rash Extremities: bilateral  Pedal edema, no calf tenderness Neurologic: without any new focal findings  Gait not checked due to patient safety concerns  Filed Weights   11/24/19 2113 11/25/19 1035  Weight: 109.1 kg 109 kg    Data Reviewed: I have personally reviewed and interpreted daily labs, tele strips, imagings as discussed above. I reviewed all nursing notes, pharmacy notes, vitals, pertinent old records I have discussed plan of care as described above with RN and patient/family.  CBC: Lab 11/24/19 0614 11/24/19 1100 11/25/19 0420  WBC 10.1 8.5 5.7  NEUTROABS 9.2* 7.4  --   HGB 10.8* 10.9* 9.5*  HCT 34.8* 35.1* 30.7*  MCV 100.9* 100.0 100.7*  PLT 195 202 0000000   Basic Metabolic Panel: Lab [id number] 11/25/19 0420  NA 141 143  K 4.1 4.3  CL 102 105  CO2 24 28  GLUCOSE 198* 141*  BUN 42* 44*  CREATININE 2.73* 2.83*  CALCIUM 9.1 8.8*    Studies: No results found.    Scheduled Meds: . allopurinol  100 mg Oral BID  . amLODipine  10 mg Oral Daily  . cloNIDine  0.3 mg Oral TID  . feeding supplement  1 Container Oral BID BM  . finasteride  5 mg Oral Daily  . hydrALAZINE  50 mg Oral Q8H  . insulin aspart  0-15 Units Subcutaneous TID WC  . insulin aspart  0-5 Units Subcutaneous QHS  . levothyroxine  50 mcg Oral Q0600  . metoprolol tartrate  50 mg Oral BID  . nutrition supplement (JUVEN)  1 packet Oral BID BM  . oxyCODONE  30 mg Oral TID  . sodium chloride flush  3 mL Intravenous Q12H  . tamsulosin  0.4 mg Oral Daily   Continuous Infusions: . pantoprozole (PROTONIX) infusion 8 mg/hr (11/26/19 1306)   PRN Meds: acetaminophen **OR** acetaminophen, HYDROmorphone (DILAUDID) injection, labetalol, ondansetron **OR** ondansetron (ZOFRAN) IV, oxyCODONE, tiZANidine  Time spent: 35 minutes  Author: Berle Mull, MD [facility] 11/25/2019 4:38 PM  To reach On-call, see care teams to locate the attending and reach out to them via [email or web address]. If 7PM-7AM, please contact night-coverage If you still have difficulty reaching the attending provider, please page the [facility] (Director  on Call) for [facility] on amion for assistance.

## 2019-11-26 NOTE — Progress Notes (Signed)
[facility] Progress Note  Patient: Thomas [name]    [id number]  DOA: 11/24/2019     Date of Service: the patient was seen and examined on 11/26/2019  Chief complaint. Back pain.  Brief hospital course: Thomas [name] is a 69 y.o. male with medical history significant of CVA, hypertension, hyperlipidemia, diabetes, gout, DM, PAD status post right BKA, chronic anemia, chronic back pain who was admitted on 1/31 to [facility] after worsening ambulatory dysfunction and fall unable to get up on his own with prolonged down time.  He was found to have a LLE wound/cellulitis with concern for outpatient treatment failure with Clindamycin.  He was treated with IV Dapto (unable to take Vanc) with plan to transition to Linezolid at d/c (due to h/o MRSA bacteremia/diskitis in 10/2019).  In the evening of 2/3, he developed abdominal pain with n/v with coffee ground emesis that was gastroccult positive and so he was transferred to [facility] for GI evaluation.  Presented with severe back pain, nausea and vomiting.  He was previously admitted at [facility] from 11/11-12/20 with T10-11 discitis and MRSA bacteremia.  He was recommended to have IV Daptomycin as inpatient and transition to Dalbavancin at the time of d/c to home.  He was discharged with home health. Found to have epidural abscess likely from his previous MRSA infection. Underwent IR guided drainage. Currently further plan is IV daptomycin treatment.  Assessment and Plan: 1.  Epidural abscess. MRSA bacteremia. T10-T11 discitis. Infectious disease consulted. Initially treated with daptomycin followed by Dalvance. MRI this admission shows evidence of epidural abscess. Currently S/P IR guided drainage of the epidural abscess 11/26/2019 Start back on IV daptomycin 11/26/2019 after discussion with the ID Follow-up on cultures. Appreciate neurosurgical consultation as well.  2.  Hematemesis. H&H remained stable. SP upper GI endoscopy by Dr. Benson Norway. No evidence  of acute abnormality. Continue PPI patient is back on regular diet.  Avoid NSAIDs. DVT prophylaxis starting tomorrow with heparin  3.  Bilateral lower extremity ulcers Follow-up on ABI. Currently on IV antibiotics.  4.  BPH Continue Flomax and Proscar  5.  Chronic kidney disease stage IV Renal function stable. Monitor. Avoid nephrotoxic medications Avoid MRI with contrast  6.  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 Currently on sliding scale insulin.  7.  Essential hypertension Blood pressure currently stable. Continue home regimen.  8.  Chronic pain syndrome. Patient is on chronic oxycodone 30 mg 3 times daily as well as Zanaflex. Which I will continue. Oxycodone 10 mg every 4 hours as needed added as well. For now Dilaudid 0.5 mg IV for severe pain.  9.  History of gout. Continue allopurinol.  10 left heel and foot unstageable ulcer.  POA. Dressing changes per wound care.  Pressure Injury 11/24/19 Heel Left Unstageable - Full thickness tissue loss in which the base of the injury is covered by slough (yellow, tan, gray, green or brown) and/or eschar (tan, brown or black) in the wound bed. (Active)  11/24/19 0655  Location: Heel  Location Orientation: Left  Staging: Unstageable - Full thickness tissue loss in which the base of the injury is covered by slough (yellow, tan, gray, green or brown) and/or eschar (tan, brown or black) in the wound bed.  Wound Description (Comments):   Present on Admission: Yes     Pressure Injury 09/02/19 Foot Left Unstageable - Full thickness tissue loss in which the base of the ulcer is covered by slough (yellow, tan, gray,  green or brown) and/or eschar (tan, brown or black) in the wound bed. (Active)  09/02/19 0815  Location: Foot  Location Orientation: Left  Staging: Unstageable - Full thickness tissue loss in which the base of the ulcer is covered by slough (yellow, tan, gray,  green or brown) and/or eschar (tan, brown or black) in the wound bed.  Wound Description (Comments):   Present on Admission: Yes     Diet: 9 cardiac and carb modified diet DVT Prophylaxis: SCD, pharmacological prophylaxis contraindicated due to Upper GI bleed   Advance goals of care discussion: Full code  Family Communication: no family was present at bedside, at the time of interview.   Disposition:  Pt is from home, admitted with back pain found to have epidural abscess, still has pending cultures, which precludes a safe discharge. Discharge to home versus SNF, when medically stable.  Subjective: Continues to have back pain and nausea no vomiting.  No abdominal pain.  No fever no chills.  Physical Exam: General:  alert oriented to time, place, and person.  Appear in mild distress, affect appropriate Eyes: PERRL ENT: Oral Mucosa Clear, moist  Neck: no JVD,  Cardiovascular: S1 and S2 Present, no Murmur,  Respiratory: good respiratory effort, Bilateral Air entry equal and Decreased, no Crackles, no wheezes Abdomen: Bowel Sound present, Soft and no tenderness,  Skin: no rash Extremities: bilateral  Pedal edema, no calf tenderness Neurologic: without any new focal findings  Gait not checked due to patient safety concerns  Vitals:   11/26/19 1425 11/26/19 1430 11/26/19 1435 11/26/19 1440  BP: (!) 166/99 (!) 179/97 (!) 156/98 (!) 155/98  Pulse: 94 95 97 91  Resp: 15 14 16 17   Temp:      TempSrc:      SpO2: 100% 100% 99% 100%  Weight:      Height:        Intake/Output Summary (Last 24 hours) at 11/26/2019 1631 Last data filed at 11/26/2019 1400 Gross per 24 hour  Intake 1347.93 ml  Output 975 ml  Net 372.93 ml   Filed Weights   11/24/19 2113 11/25/19 1035  Weight: 109.1 kg 109 kg    Data Reviewed: I have personally reviewed and interpreted daily labs, tele strips, imagings as discussed above. I reviewed all nursing notes, pharmacy notes, vitals, pertinent old  records I have discussed plan of care as described above with RN and patient/family.  CBC: Recent Labs  Lab 11/24/19 0614 11/24/19 1100 11/25/19 0420 11/26/19 0754  WBC 10.1 8.5 5.7 3.8*  NEUTROABS 9.2* 7.4  --  3.0  HGB 10.8* 10.9* 9.5* 8.9*  HCT 34.8* 35.1* 30.7* 28.7*  MCV 100.9* 100.0 100.7* 98.6  PLT 195 202 160 0000000*   Basic Metabolic Panel: Recent Labs  Lab 11/24/19 0614 11/25/19 0420 11/26/19 0754  NA 141 143 141  K 4.1 4.3 3.7  CL 102 105 104  CO2 24 28 26   GLUCOSE 198* 141* 152*  BUN 42* 44* 39*  CREATININE 2.73* 2.83* 2.67*  CALCIUM 9.1 8.8* 8.3*    Studies: No results found.  Scheduled Meds: . allopurinol  100 mg Oral BID  . amLODipine  10 mg Oral Daily  . cloNIDine  0.3 mg Oral TID  . feeding supplement  1 Container Oral BID BM  . finasteride  5 mg Oral Daily  . hydrALAZINE  50 mg Oral Q8H  . insulin aspart  0-15 Units Subcutaneous TID WC  . insulin aspart  0-5 Units Subcutaneous QHS  .  levothyroxine  50 mcg Oral Q0600  . metoprolol tartrate  50 mg Oral BID  . nutrition supplement (JUVEN)  1 packet Oral BID BM  . oxyCODONE  30 mg Oral TID  . sodium chloride flush  3 mL Intravenous Q12H  . tamsulosin  0.4 mg Oral Daily   Continuous Infusions: . pantoprozole (PROTONIX) infusion 8 mg/hr (11/26/19 1306)   PRN Meds: acetaminophen **OR** acetaminophen, HYDROmorphone (DILAUDID) injection, labetalol, ondansetron **OR** ondansetron (ZOFRAN) IV, oxyCODONE, tiZANidine  Time spent: 35 minutes  Author: Berle Mull, MD [facility] 11/26/2019 4:31 PM  To reach On-call, see care teams to locate the attending and reach out to them via [email or web address]. If 7PM-7AM, please contact night-coverage If you still have difficulty reaching the attending provider, please page the [facility] (Director on Call) for [facility] on amion for assistance.

## 2019-11-26 NOTE — Procedures (Signed)
Interventional Radiology Procedure Note  Procedure: Fluoro guided T10-T11 disc aspirate.  ~5cc of amber fluid removed.    Complications: None  Recommendations:  - follow up culture - Do not submerge for 7 days - Routine wound care   Signed,  Dulcy Fanny. Earleen Newport, DO

## 2019-11-26 NOTE — H&P (Signed)
Chief Complaint: Back pain  Referring Physician(s): Dr. Dossie Arbour  Supervising Physician: Corrie Mckusick  Patient Status: [name] - In-pt  History of Present Illness: Thomas [name] is a 69 y.o. 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 Patient has already completed a round of antibiotics. Team is requesting aspiration of T10/11 for further diagnostic evaluation.  Past Medical History:  Diagnosis Date  . Arthritis   . BPH (benign prostatic hyperplasia) 11/24/2019  . Chronic pain syndrome 11/24/2019  . CKD (chronic kidney disease), stage IV ([place]) 11/24/2019  . Diabetes mellitus without complication ([place])   . Gout   . Hypertension   . Muscle pain   . Physical deconditioning 11/24/2019  . Scars   . Swelling     Past Surgical History:  Procedure Laterality Date  . BELOW KNEE LEG AMPUTATION Right   . IR FLUORO GUIDE CV LINE RIGHT  09/05/2019  . IR US GUIDE VASC ACCESS RIGHT  09/05/2019    Allergies: Other, Oxycodone, Oxymorphone, Aspirin, Beta vulgaris, Buspirone, Cabbage, Codeine, Fish allergy, Fish-derived products, Pentazocine, Propoxyphene, Shellfish allergy, Sulfa antibiotics, Sulfasalazine, Amoxicillin, and Apap [acetaminophen]  Medications: Prior to Admission medications   Medication Sig Start Date End Date Taking? Authorizing Provider  allopurinol (ZYLOPRIM) 100 MG tablet Take 100 mg by mouth 2 (two) times daily.   Yes [provider]  amLODipine (NORVASC) 10 MG tablet Take 1 tablet by mouth daily as needed (high blood pressure).    Yes [provider]  cloNIDine (CATAPRES) 0.3 MG tablet Take 0.3 mg by mouth 3 (three) times daily.    Yes [provider]  Cyanocobalamin (VITAMIN B-12) 5000 MCG TBDP Take 2,500 mcg by mouth daily.   Yes [provider]  ferrous sulfate 325 (65 FE) MG tablet Take 325 mg by mouth 2 (two) times daily.   Yes [provider]  glipiZIDE (GLUCOTROL XL) 10 MG 24  hr tablet Take 10 mg by mouth 2 (two) times daily.  12/20/15  Yes [provider]  hydrALAZINE (APRESOLINE) 50 MG tablet Take 1 tablet (50 mg total) by mouth every 8 (eight) hours. Patient taking differently: Take 50 mg by mouth 3 (three) times daily as needed (high blood pressure).  09/08/19  Yes Nita Sells, MD  levothyroxine (SYNTHROID) 50 MCG tablet Take 50 mcg by mouth daily before breakfast.    Yes [provider]  metoprolol tartrate (LOPRESSOR) 50 MG tablet Take 1 tablet (50 mg total) by mouth 2 (two) times daily. Patient taking differently: Take 50 mg by mouth 2 (two) times daily as needed (when heart rate is 78 or higher).  09/08/19  Yes Nita Sells, MD  Multiple Vitamin (MULTIVITAMIN WITH MINERALS) TABS tablet Take 1 tablet by mouth daily. 10/10/19  Yes Hosie Poisson, MD  NEURONTIN 300 MG capsule Take 300 mg by mouth 3 (three) times daily as needed (nerve pain).   Yes [provider]  OXYCONTIN 30 MG 12 hr tablet Take 1 tablet by mouth 3 (three) times daily. 11/10/19  Yes [provider]  polyethylene glycol (MIRALAX / GLYCOLAX) 17 g packet Take 17 g by mouth daily as needed for mild constipation.    Yes [provider]  tamsulosin (FLOMAX) 0.4 MG CAPS capsule Take 0.4 mg by mouth daily.   Yes [provider]  Vitamin D, Ergocalciferol, (DRISDOL) 1.25 MG (50000 UT) CAPS capsule Take 1 capsule by mouth every Saturday.    Yes [provider]  dalbavancin (DALVANCE) 500 MG SOLR Give Dalvance 1500 mg X 1 then 500 mg every 7 days X 3 doses Indication: MRSA bacteremia/discitis Last dose around 1/7 Patient not taking: Reported on 11/24/2019 10/05/19   Georgette Shell, MD  finasteride (PROSCAR) 5 MG tablet Take 1 tablet (5 mg total) by mouth daily. Patient not taking: Reported on 11/24/2019 10/10/19   Hosie Poisson, MD  nutrition supplement, JUVEN, (JUVEN) PACK Take 1 packet by mouth 2 (two) times daily between meals.  Patient not taking: Reported on 11/24/2019 10/09/19   Hosie Poisson, MD  Oxycodone HCl 20 MG TABS Take 1 tablet (20 mg total) by mouth every 8 (eight) hours as needed. Patient not taking: Reported on 11/24/2019 10/07/19   Georgette Shell, MD  PEGLOTICASE IV Inject 1 Dose into the vein every 14 (fourteen) days.    [provider]  senna-docusate (SENOKOT-S) 8.6-50 MG tablet Take 1 tablet by mouth 2 (two) times daily. Patient not taking: Reported on 11/24/2019 10/09/19   Hosie Poisson, MD  tiZANidine (ZANAFLEX) 2 MG tablet Take 1 tablet (2 mg total) by mouth every 8 (eight) hours as needed for muscle spasms. Patient not taking: Reported on 11/24/2019 09/08/19   Nita Sells, MD     Family History  Family history unknown: Yes    Social History   Socioeconomic History  . Marital status: Single    Spouse name: Not on file  . Number of children: Not on file  . Years of education: Not on file  . Highest education level: Not on file  Occupational History  . Not on file  Tobacco Use  . Smoking status: Never Smoker  . Smokeless tobacco: Never Used  Substance and Sexual Activity  . Alcohol use: No  . Drug use: No  . Sexual activity: Not on file  Other Topics Concern  . Not on file  Social History Narrative  . Not on file   Social Determinants of Health   Financial Resource Strain:   . Difficulty of Paying Living Expenses: Not on file  Food Insecurity:   . Worried About Charity fundraiser in the Last Year: Not on file  . Ran Out of Food in the Last Year: Not on file  Transportation Needs:   . Lack of Transportation (Medical): Not on file  . Lack of Transportation (Non-Medical): Not on file  Physical Activity:   . Days of Exercise per Week: Not on file  . Minutes of Exercise per Session: Not on file  Stress:   . Feeling of Stress : Not on file  Social Connections:   . Frequency of Communication with Friends and Family: Not on file  . Frequency of Social  Gatherings with Friends and Family: Not on file  . Attends Religious Services: Not on file  . Active Member of Clubs or Organizations: Not on file  . Attends Archivist Meetings: Not on file  . Marital Status: Not on file    Review of Systems: A 12 point ROS discussed and pertinent positives are indicated in the HPI above.  All other systems are negative.  Review of Systems  Constitutional: Negative for fever.  HENT: Negative for congestion.   Respiratory: Negative for cough and shortness of breath.   Cardiovascular: Negative for chest pain.  Gastrointestinal: Negative for abdominal pain.  Musculoskeletal: Positive for back pain.  Neurological: Negative for headaches.  Psychiatric/Behavioral: Negative for behavioral problems and confusion.    Vital Signs: BP (!) 171/98 (BP  Location: Left Arm)   Pulse 85   Temp 98.7 F (37.1 C) (Oral)   Resp 20   Ht 6\' 6"  (1.981 m)   Wt 240 lb 4.8 oz (109 kg)   SpO2 96%   BMI 27.77 kg/m   Physical Exam Vitals and nursing note reviewed.  Constitutional:      Appearance: He is well-developed.  HENT:     Head: Normocephalic.  Cardiovascular:     Rate and Rhythm: Normal rate and regular rhythm.  Pulmonary:     Effort: Pulmonary effort is normal.  Musculoskeletal:        General: Normal range of motion.     Cervical back: Normal range of motion.     Comments: BKA left leg  Skin:    General: Skin is dry.  Neurological:     Mental Status: He is alert and oriented to person, place, and time.     Imaging: MR THORACIC SPINE WO CONTRAST  Result Date: 11/25/2019 CLINICAL DATA:  T10-11 discitis and osteomyelitis. EXAM: MRI THORACIC AND LUMBAR SPINE WITHOUT CONTRAST TECHNIQUE: Multiplanar and multiecho pulse sequences of the thoracic and lumbar spine were obtained without intravenous contrast. COMPARISON:  CT scans dated 09/17/2019 FINDINGS: MRI THORACIC SPINE FINDINGS Alignment:  Physiologic. Vertebrae: There has been progressive  destruction of the inferior aspect of T10 and of the superior aspect of T11. There is prominent fluid in the disc space. The inflammatory process extends into the paraspinal soft tissues to the right and left and anteriorly at T10-11. There is abnormal soft tissue anterior to the thecal sac at T10 and at T11 which could represent epidural abscess. Abnormal edema extends into the pedicles and posterior elements at T10 and T11 with effusions at both facet joints. These combine to compress the thecal sac and slightly compress the spinal cord at T10-11 without discrete myelopathy. Cord: The spinal cord is normal except for slight compression of the spinal cord at T10-11 as described above. Tip of the conus is at T12-L1. Paraspinal and other soft tissues: Abnormal soft tissue surrounds the anterior and lateral aspects of T10 and T11 likely representing cellulitis and paraspinal abscess. The appearance is similar to that seen on the prior CT scan of 09/17/2019. Disc levels: T1-2 through T7-8: Negative. T8-9: Chronic small broad-based disc bulge without neural impingement, unchanged since the prior CT scan. T9-10: Negative. T10-11: Diffuse osteomyelitis and discitis and perispinal cellulitis and abscess as described above. Bilateral facet effusions are likely infected. The hypertrophied ligamentum flavum and facet joints combine with bulging disc space and destroyed inferior endplate of T1 and superior endplate of 624THL combine to create slight spinal stenosis and slight compression of the spinal cord without myelopathy. Epidural soft tissue anteriorly at T10 and T11 likely a represents abscess this was not apparent on the prior unenhanced CT scan. T11-12: Chronic disc space narrowing. Small disc protrusion and annular fissure to the left of midline. No significant change since the prior CT scan. MRI LUMBAR SPINE FINDINGS Segmentation:  Standard. Alignment: Chronic 3 mm retrolisthesis of L1 on L2. Lumbar scoliosis with  convexity to the left centered at L2, stable. Vertebrae:  The spine is fused posteriorly from T12 through S1. Conus medullaris and cauda equina: Conus extends to the T12-L1 level. Conus and cauda equina appear normal. Paraspinal and other soft tissues: Chronic severe atrophy of the right kidney. The bladder is distended. Disc levels: T12-L1: Normal disc. Solid posterior fusion. No neural impingement. L1-2: Chronic slight retrolisthesis with chronic disc space  narrowing. Posterior fusion. No neural impingement. L2-3 through L5-S1: Solid chronic fusions at each level with no neural impingement. IMPRESSION: MR OF THE THORACIC SPINE IMPRESSION 1. Progressive osteomyelitis and persistent perispinal cellulitis and abscess at T10-11 with a probable epidural abscess anterior to the thecal sac at T10-11 and T11-12 which was not apparent on the prior unenhanced CT scan of 09/17/2019. 2. Slight compression of the thecal sac and thoracic spinal cord at T10-11 as described above. 3. Chronic degenerative disc disease at T8-9 and T11-12 without focal neural impingement. MR LUMBAR SPINE IMPRESSION No acute abnormalities of the lumbar spine. The lumbar spine is fused from T12-S1. Electronically Signed   By: Lorriane Shire M.D.   On: 11/25/2019 15:10   MR LUMBAR SPINE WO CONTRAST  Result Date: 11/25/2019 CLINICAL DATA:  T10-11 discitis and osteomyelitis. EXAM: MRI THORACIC AND LUMBAR SPINE WITHOUT CONTRAST TECHNIQUE: Multiplanar and multiecho pulse sequences of the thoracic and lumbar spine were obtained without intravenous contrast. COMPARISON:  CT scans dated 09/17/2019 FINDINGS: MRI THORACIC SPINE FINDINGS Alignment:  Physiologic. Vertebrae: There has been progressive destruction of the inferior aspect of T10 and of the superior aspect of T11. There is prominent fluid in the disc space. The inflammatory process extends into the paraspinal soft tissues to the right and left and anteriorly at T10-11. There is abnormal soft tissue  anterior to the thecal sac at T10 and at T11 which could represent epidural abscess. Abnormal edema extends into the pedicles and posterior elements at T10 and T11 with effusions at both facet joints. These combine to compress the thecal sac and slightly compress the spinal cord at T10-11 without discrete myelopathy. Cord: The spinal cord is normal except for slight compression of the spinal cord at T10-11 as described above. Tip of the conus is at T12-L1. Paraspinal and other soft tissues: Abnormal soft tissue surrounds the anterior and lateral aspects of T10 and T11 likely representing cellulitis and paraspinal abscess. The appearance is similar to that seen on the prior CT scan of 09/17/2019. Disc levels: T1-2 through T7-8: Negative. T8-9: Chronic small broad-based disc bulge without neural impingement, unchanged since the prior CT scan. T9-10: Negative. T10-11: Diffuse osteomyelitis and discitis and perispinal cellulitis and abscess as described above. Bilateral facet effusions are likely infected. The hypertrophied ligamentum flavum and facet joints combine with bulging disc space and destroyed inferior endplate of T1 and superior endplate of 624THL combine to create slight spinal stenosis and slight compression of the spinal cord without myelopathy. Epidural soft tissue anteriorly at T10 and T11 likely a represents abscess this was not apparent on the prior unenhanced CT scan. T11-12: Chronic disc space narrowing. Small disc protrusion and annular fissure to the left of midline. No significant change since the prior CT scan. MRI LUMBAR SPINE FINDINGS Segmentation:  Standard. Alignment: Chronic 3 mm retrolisthesis of L1 on L2. Lumbar scoliosis with convexity to the left centered at L2, stable. Vertebrae:  The spine is fused posteriorly from T12 through S1. Conus medullaris and cauda equina: Conus extends to the T12-L1 level. Conus and cauda equina appear normal. Paraspinal and other soft tissues: Chronic severe  atrophy of the right kidney. The bladder is distended. Disc levels: T12-L1: Normal disc. Solid posterior fusion. No neural impingement. L1-2: Chronic slight retrolisthesis with chronic disc space narrowing. Posterior fusion. No neural impingement. L2-3 through L5-S1: Solid chronic fusions at each level with no neural impingement. IMPRESSION: MR OF THE THORACIC SPINE IMPRESSION 1. Progressive osteomyelitis and persistent perispinal cellulitis and abscess  at T10-11 with a probable epidural abscess anterior to the thecal sac at T10-11 and T11-12 which was not apparent on the prior unenhanced CT scan of 09/17/2019. 2. Slight compression of the thecal sac and thoracic spinal cord at T10-11 as described above. 3. Chronic degenerative disc disease at T8-9 and T11-12 without focal neural impingement. MR LUMBAR SPINE IMPRESSION No acute abnormalities of the lumbar spine. The lumbar spine is fused from T12-S1. Electronically Signed   By: Lorriane Shire M.D.   On: 11/25/2019 15:10    Labs:  CBC: Recent Labs    11/24/19 0614 11/24/19 1100 11/25/19 0420 11/26/19 0754  WBC 10.1 8.5 5.7 3.8*  HGB 10.8* 10.9* 9.5* 8.9*  HCT 34.8* 35.1* 30.7* 28.7*  PLT 195 202 160 125*    COAGS: Recent Labs    09/04/19 0541 11/24/19 0614 11/26/19 0754  INR 1.2 1.0 1.2    BMP: Recent Labs    10/05/19 0236 10/05/19 0236 10/08/19 0333 11/24/19 0614 11/25/19 0420 11/26/19 0754  NA 135  --   --  141 143 141  K 4.8  --   --  4.1 4.3 3.7  CL 102  --   --  102 105 104  CO2 23  --   --  24 28 26   GLUCOSE 204*  --   --  198* 141* 152*  BUN 40*  --   --  42* 44* 39*  CALCIUM 8.8*  --   --  9.1 8.8* 8.3*  CREATININE 2.96*   < > 2.80* 2.73* 2.83* 2.67*  GFRNONAA 21*   < > 22* 23* 22* 24*  GFRAA 24*   < > 26* 27* 26* 28*   < > = values in this interval not displayed.    LIVER FUNCTION TESTS: Recent Labs    08/31/19 0445 09/05/19 0718 09/14/19 0312 09/22/19 0402 11/24/19 0614 11/26/19 0754  BILITOT 0.7  --    --  0.4 0.7 0.5  AST 33  --   --  21 27 29   ALT 15  --   --  14 13 15   ALKPHOS 102  --   --  81 75 55  PROT 7.5  --   --  6.6 8.1 6.6  ALBUMIN 2.4*   < > 2.3* 2.5* 3.2* 2.6*   < > = values in this interval not displayed.    TUMOR MARKERS: No results for input(s): AFPTM, CEA, CA199, CHROMGRNA in the last 8760 hours.  Assessment and Plan:  69 y.o, male inpatient. 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 Patient has already completed a round of antibiotics. Team is requesting aspiration of T10/11 for further diagnostic evaluation.  Pertinent Imaging 2.5.21 - MR Thoracic reads Diffuse osteomyelitis and discitis and perispinal cellulitis and abscess as described above. Bilateral facet effusions are likely infected. The hypertrophied ligamentum flavum and facet joints combine with bulging disc space and destroyed inferior endplate of T1 and superior endplate of 624THL combine to create slight spinal stenosis and slight compression of the spinal cord without myelopathy. Epidural soft tissue anteriorly at T10 and T11 likely a represents abscess this was not apparent on the prior unenhanced CT scan.  Pertinent IR History 11.16.20 - Dialysis catheter RIJ  Pertinent Allergies See list  WBC is 2.5  All labs and medications are within acceptable parameters.  Patient is afebrile.  Risks and benefits of aspiration of T10/T11 was discussed with the patient and/or patient's family including, but  not limited to bleeding, infection, damage to adjacent structures or low yield requiring additional tests.  All of the questions were answered and there is agreement to proceed.  Consent signed and in chart.   Thank you for this interesting consult.  I greatly enjoyed meeting GUERIN VELARDE and look forward to participating in their care.  A copy of this report was sent to the requesting provider on this date.  Electronically Signed: Avel Peace, NP  11/26/2019, 9:10 AM   I spent a total of 40 Minutes 40 Minutes in face to face in clinical consultation, greater than 50% of which was counseling/coordinating care for aspiration of T10/T11

## 2019-11-26 NOTE — Progress Notes (Signed)
VASCULAR LAB     Spoke with Autumn, RN, regarding removing patient's bandages for ABI.  She states bandages cannot be removed.  Please contact the Vascular Lab when patient's bandages can be removed.  Thank you.  Cortland Crehan, RVT 11/26/2019, 11:24 AM

## 2019-11-26 NOTE — Plan of Care (Signed)
  Problem: Education: Goal: Knowledge of General Education information will improve Description: Including pain rating scale, medication(s)/side effects and non-pharmacologic comfort measures Outcome: Progressing   Problem: Activity: Goal: Risk for activity intolerance will decrease Outcome: Progressing   

## 2019-11-27 LAB — GLUCOSE, CAPILLARY
Glucose-Capillary: 133 mg/dL — ABNORMAL HIGH (ref 70–99)
Glucose-Capillary: 129 mg/dL — ABNORMAL HIGH (ref 70–99)
Glucose-Capillary: 130 mg/dL — ABNORMAL HIGH (ref 70–99)
Glucose-Capillary: 171 mg/dL — ABNORMAL HIGH (ref 70–99)

## 2019-11-27 MED ORDER — PANTOPRAZOLE SODIUM 40 MG PO TBEC
40.0000 mg | DELAYED_RELEASE_TABLET | Freq: Every day | ORAL | Status: DC
  Administered 2019-11-27 – 2019-12-17 (×21): 40 mg via ORAL
  Filled 2019-11-27 (×15): qty 1
  Filled 2019-11-27: qty 2
  Filled 2019-11-27 (×4): qty 1
  Filled 2019-11-27: qty 2
  Filled 2019-11-27: qty 1

## 2019-11-27 MED ORDER — HEPARIN SODIUM (PORCINE) 5000 UNIT/ML IJ SOLN
5000.0000 [IU] | Freq: Three times a day (TID) | INTRAMUSCULAR | Status: DC
  Administered 2019-11-27 – 2019-12-17 (×61): 5000 [IU] via SUBCUTANEOUS
  Filled 2019-11-27 (×54): qty 1

## 2019-11-27 NOTE — Progress Notes (Signed)
[facility] Progress Note  Patient: Thomas [name]    [id number]  DOA: 11/24/2019     Date of Service: the patient was seen and examined on 11/27/2019  Chief complaint. Back pain.  Brief hospital course: Thomas [name] is a 69 y.o. male with medical history significant of CVA, hypertension, hyperlipidemia, diabetes, gout, DM, PAD status post right BKA, chronic anemia, chronic back pain who was admitted on 1/31 to [facility] after worsening ambulatory dysfunction and fall unable to get up on his own with prolonged down time.  He was found to have a LLE wound/cellulitis with concern for outpatient treatment failure with Clindamycin.  He was treated with IV Dapto (unable to take Vanc) with plan to transition to Linezolid at d/c (due to h/o MRSA bacteremia/diskitis in 10/2019).  In the evening of 2/3, he developed abdominal pain with n/v with coffee ground emesis that was gastroccult positive and so he was transferred to [facility] for GI evaluation.  Presented with severe back pain, nausea and vomiting.  He was previously admitted at [facility] from 11/11-12/20 with T10-11 discitis and MRSA bacteremia.  He was recommended to have IV Daptomycin as inpatient and transition to Dalbavancin at the time of d/c to home.  He was discharged with home health. Found to have epidural abscess likely from his previous MRSA infection. Underwent IR guided drainage. Currently further plan is IV daptomycin treatment.  Assessment and Plan: 1.  Epidural abscess. MRSA bacteremia. T10-T11 discitis. Infectious disease consulted. Initially treated with daptomycin followed by Dalvance. MRI this admission shows evidence of epidural abscess. Currently S/P IR guided drainage of the epidural abscess 11/26/2019 Start back on IV daptomycin 11/26/2019 after discussion with the ID Follow-up on cultures. Appreciate neurosurgical consultation as well.  2.  Hematemesis. H&H remained stable. SP upper GI endoscopy by Dr. Benson Norway. No evidence  of acute abnormality. Continue PPI patient is back on regular diet.  Avoid NSAIDs. DVT prophylaxis starting tomorrow with heparin  3.  Bilateral lower extremity ulcers Follow-up on ABI. Currently on IV antibiotics.  4.  BPH Continue Flomax and Proscar  5.  Chronic kidney disease stage IV Renal function stable. Monitor. Avoid nephrotoxic medications Avoid MRI with contrast  6.  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 Currently on sliding scale insulin.  7.  Essential hypertension Blood pressure currently stable. Continue home regimen.  8.  Chronic pain syndrome. Patient is on chronic oxycodone 30 mg 3 times daily as well as Zanaflex. Which I will continue. Oxycodone 10 mg every 4 hours as needed added as well. For now Dilaudid 0.5 mg IV for severe pain.  9.  History of gout. Continue allopurinol.  10 left heel and foot unstageable ulcer.  POA. Dressing changes per wound care.  Pressure Injury 11/24/19 Heel Left Unstageable - Full thickness tissue loss in which the base of the injury is covered by slough (yellow, tan, gray, green or brown) and/or eschar (tan, brown or black) in the wound bed. (Active)  11/24/19 0655  Location: Heel  Location Orientation: Left  Staging: Unstageable - Full thickness tissue loss in which the base of the injury is covered by slough (yellow, tan, gray, green or brown) and/or eschar (tan, brown or black) in the wound bed.  Wound Description (Comments):   Present on Admission: Yes     Pressure Injury 09/02/19 Foot Left Unstageable - Full thickness tissue loss in which the base of the ulcer is covered by slough (yellow, tan, gray,  green or brown) and/or eschar (tan, brown or black) in the wound bed. (Active)  09/02/19 0815  Location: Foot  Location Orientation: Left  Staging: Unstageable - Full thickness tissue loss in which the base of the ulcer is covered by slough (yellow, tan, gray,  green or brown) and/or eschar (tan, brown or black) in the wound bed.  Wound Description (Comments):   Present on Admission: Yes     Diet: cardiac and carb modified diet DVT Prophylaxis: SCD, pharmacological prophylaxis contraindicated due to Upper GI bleed   Advance goals of care discussion: Full code  Family Communication: no family was present at bedside, at the time of interview.   Disposition:  Pt is from home, admitted with back pain found to have epidural abscess, still has pending cultures, which precludes a safe discharge. Discharge to home versus SNF, when medically stable.  Subjective: Pain is well controlled.  Patient is able to lift his right leg up.  No nausea no vomiting no fever no chills.  No diarrhea no constipation.  Physical Exam: General: Appear in mild distress, no Rash; Oral Mucosa Clear, moist. no Abnormal Mass Or lumps Cardiovascular: S1 and S2 Present, no Murmur, Respiratory: normal respiratory effort, Bilateral Air entry present and Clear to Auscultation, no Crackles, no wheezes Abdomen: Bowel Sound present, Soft and no tenderness, no hernia Extremities: Right BKA, no pedal edema, no calf tenderness Neurology: alert and oriented to time, place, and person affect appropriate.  Vitals:   11/27/19 0500 11/27/19 0927 11/27/19 1300 11/27/19 1515  BP: (!) 157/94 (!) 177/92 (!) 164/100 (!) 169/89  Pulse: 76 89    Resp: 16 18    Temp: 98.7 F (37.1 C) 98.5 F (36.9 C)    TempSrc: Oral Oral    SpO2: 93% 97%    Weight:      Height:        Intake/Output Summary (Last 24 hours) at 11/27/2019 1826 Last data filed at 11/27/2019 1559 Gross per 24 hour  Intake 1364.52 ml  Output 2775 ml  Net -1410.48 ml   Filed Weights   11/24/19 2113 11/25/19 1035 11/26/19 2330  Weight: 109.1 kg 109 kg 111.6 kg    Data Reviewed: I have personally reviewed and interpreted daily labs, tele strips, imagings as discussed above. I reviewed all nursing notes, pharmacy notes,  vitals, pertinent old records I have discussed plan of care as described above with RN and patient/family.  CBC: Recent Labs  Lab 11/24/19 0614 11/24/19 1100 11/25/19 0420 11/26/19 0754  WBC 10.1 8.5 5.7 3.8*  NEUTROABS 9.2* 7.4  --  3.0  HGB 10.8* 10.9* 9.5* 8.9*  HCT 34.8* 35.1* 30.7* 28.7*  MCV 100.9* 100.0 100.7* 98.6  PLT 195 202 160 0000000*   Basic Metabolic Panel: Recent Labs  Lab 11/24/19 0614 11/25/19 0420 11/26/19 0754  NA 141 143 141  K 4.1 4.3 3.7  CL 102 105 104  CO2 24 28 26   GLUCOSE 198* 141* 152*  BUN 42* 44* 39*  CREATININE 2.73* 2.83* 2.67*  CALCIUM 9.1 8.8* 8.3*    Studies: No results found.  Scheduled Meds: . allopurinol  100 mg Oral BID  . amLODipine  10 mg Oral Daily  . cloNIDine  0.3 mg Oral TID  . feeding supplement  1 Container Oral BID BM  . finasteride  5 mg Oral Daily  . heparin injection (subcutaneous)  5,000 Units Subcutaneous Q8H  . hydrALAZINE  50 mg Oral Q8H  . insulin aspart  0-15 Units  Subcutaneous TID WC  . insulin aspart  0-5 Units Subcutaneous QHS  . levothyroxine  50 mcg Oral Q0600  . metoprolol tartrate  50 mg Oral BID  . nutrition supplement (JUVEN)  1 packet Oral BID BM  . oxyCODONE  30 mg Oral TID  . pantoprazole  40 mg Oral Daily  . sodium chloride flush  3 mL Intravenous Q12H  . tamsulosin  0.4 mg Oral Daily   Continuous Infusions: . sodium chloride 10 mL/hr at 11/26/19 1900  . DAPTOmycin (CUBICIN)  IV 1,000 mg (11/27/19 1659)   PRN Meds: sodium chloride, acetaminophen **OR** acetaminophen, HYDROmorphone (DILAUDID) injection, labetalol, ondansetron **OR** ondansetron (ZOFRAN) IV, oxyCODONE, tiZANidine  Time spent: 35 minutes  Author: Berle Mull, MD [facility] 11/27/2019 6:26 PM  To reach On-call, see care teams to locate the attending and reach out to them via [email or web address]. If 7PM-7AM, please contact night-coverage If you still have difficulty reaching the attending provider, please page the [facility]  (Director on Call) for [facility] on amion for assistance.

## 2019-11-28 DIAGNOSIS — E1122 Type 2 diabetes mellitus with diabetic chronic kidney disease: Secondary | ICD-10-CM

## 2019-11-28 DIAGNOSIS — N184 Chronic kidney disease, stage 4 (severe): Secondary | ICD-10-CM

## 2019-11-28 DIAGNOSIS — G894 Chronic pain syndrome: Secondary | ICD-10-CM

## 2019-11-28 DIAGNOSIS — N400 Enlarged prostate without lower urinary tract symptoms: Secondary | ICD-10-CM

## 2019-11-28 DIAGNOSIS — M4640 Discitis, unspecified, site unspecified: Secondary | ICD-10-CM

## 2019-11-28 DIAGNOSIS — L89626 Pressure-induced deep tissue damage of left heel: Secondary | ICD-10-CM

## 2019-11-28 DIAGNOSIS — G062 Extradural and subdural abscess, unspecified: Secondary | ICD-10-CM | POA: Diagnosis present

## 2019-11-28 DIAGNOSIS — M1A09X0 Idiopathic chronic gout, multiple sites, without tophus (tophi): Secondary | ICD-10-CM

## 2019-11-28 DIAGNOSIS — I10 Essential (primary) hypertension: Secondary | ICD-10-CM

## 2019-11-28 LAB — CBC
HCT: 28.4 % — ABNORMAL LOW (ref 39.0–52.0)
Hemoglobin: 9 g/dL — ABNORMAL LOW (ref 13.0–17.0)
MCH: 31.4 pg (ref 26.0–34.0)
MCHC: 31.7 g/dL (ref 30.0–36.0)
MCV: 99 fL (ref 80.0–100.0)
Platelets: 164 10*3/uL (ref 150–400)
RBC: 2.87 MIL/uL — ABNORMAL LOW (ref 4.22–5.81)
RDW: 14.3 % (ref 11.5–15.5)
WBC: 4.8 10*3/uL (ref 4.0–10.5)
nRBC: 0 % (ref 0.0–0.2)

## 2019-11-28 LAB — GLUCOSE, CAPILLARY
Glucose-Capillary: 151 mg/dL — ABNORMAL HIGH (ref 70–99)
Glucose-Capillary: 119 mg/dL — ABNORMAL HIGH (ref 70–99)
Glucose-Capillary: 161 mg/dL — ABNORMAL HIGH (ref 70–99)
Glucose-Capillary: 160 mg/dL — ABNORMAL HIGH (ref 70–99)

## 2019-11-28 LAB — BASIC METABOLIC PANEL
Anion gap: 9 (ref 5–15)
BUN: 28 mg/dL — ABNORMAL HIGH (ref 8–23)
CO2: 25 mmol/L (ref 22–32)
Calcium: 8.5 mg/dL — ABNORMAL LOW (ref 8.9–10.3)
Chloride: 107 mmol/L (ref 98–111)
Creatinine, Ser: 2.26 mg/dL — ABNORMAL HIGH (ref 0.61–1.24)
GFR calc Af Amer: 34 mL/min — ABNORMAL LOW (ref >60)
GFR calc non Af Amer: 29 mL/min — ABNORMAL LOW (ref >60)
Glucose, Bld: 157 mg/dL — ABNORMAL HIGH (ref 70–99)
Potassium: 3.7 mmol/L (ref 3.5–5.1)
Sodium: 141 mmol/L (ref 135–145)

## 2019-11-28 LAB — MAGNESIUM: Magnesium: 1.7 mg/dL (ref 1.7–2.4)

## 2019-11-28 LAB — CK: Total CK: 38 U/L — ABNORMAL LOW (ref 49–397)

## 2019-11-28 MED ORDER — LIDOCAINE 5 % EX PTCH
2.0000 | MEDICATED_PATCH | CUTANEOUS | Status: DC
  Administered 2019-11-29 – 2019-12-05 (×2): 2 via TRANSDERMAL
  Filled 2019-11-28 (×9): qty 2

## 2019-11-28 NOTE — Social Work (Signed)
CSW acknowledging consult for SNF placement/HH/DME. Will follow for therapy recommendations needed to best determine disposition/for insurance authorization.   Kiante Petrovich, MSW, LCSW [place] [facility]    

## 2019-11-28 NOTE — Progress Notes (Signed)
[facility] for Infectious Disease  Date of Admission:  11/24/2019     Total days of antibiotics 3         ASSESSMENT:  Thomas [name] has improvement of his right lower extremity function today. Disc aspiration cultures and blood cultures have been without growth to date. He has remained hemodynamically stable and afebrile. Will need continued prolonged therapy. Social work consulted for possible discharge to skilled nursing facility. He will require additional prolonged therapy. CK level of 38. Continue current dose of Daptomycin.   PLAN:  1. Continue current dose of Daptomycin.  2. No further episodes of hematemsis. 3. Monitor CK levels while on daptomycin.  4. Diabetes management per primary team.   Principal Problem:   Upper GI bleeding Active Problems:   [name], type 2) (HCC)   Gout   HTN (hypertension)   Discitis   Pressure injury of skin   BPH (benign prostatic hyperplasia)   CKD (chronic kidney disease), stage IV (HCC)   Chronic pain syndrome   Physical deconditioning   . allopurinol  100 mg Oral BID  . amLODipine  10 mg Oral Daily  . cloNIDine  0.3 mg Oral TID  . feeding supplement  1 Container Oral BID BM  . finasteride  5 mg Oral Daily  . heparin injection (subcutaneous)  5,000 Units Subcutaneous Q8H  . hydrALAZINE  50 mg Oral Q8H  . insulin aspart  0-15 Units Subcutaneous TID WC  . insulin aspart  0-5 Units Subcutaneous QHS  . levothyroxine  50 mcg Oral Q0600  . metoprolol tartrate  50 mg Oral BID  . nutrition supplement (JUVEN)  1 packet Oral BID BM  . oxyCODONE  30 mg Oral TID  . pantoprazole  40 mg Oral Daily  . sodium chloride flush  3 mL Intravenous Q12H  . tamsulosin  0.4 mg Oral Daily    SUBJECTIVE:  Afebrile overnight with no acute events. Able to move right lower extremity more today.   Allergies  Allergen Reactions  . Other Other (See Comments)    Blood pressure issues Per [facility] hospital: Pt states he can  only take these pain meds or else he gets very sick: Oxycontin,morphine,demerol, and dilaudid are the only pain meds pt states he can take.    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 2.4.2021: He also reports that he currently take Oxycontin.   Marland Kitchen Oxymorphone Other (See Comments)    Causes kidney problems  . Aspirin Nausea And Vomiting    Per [facility]  . Beta Vulgaris Nausea And Vomiting    Beets  . Buspirone Other (See Comments)    unknown  . Cabbage Nausea And Vomiting  . Codeine Other (See Comments)    unknown  . Fish Allergy Nausea And Vomiting  . Fish-Derived Products Nausea And Vomiting  . Pentazocine Other (See Comments)    unknown  . Propoxyphene Other (See Comments)    unknown  . Shellfish Allergy Nausea And Vomiting  . Sulfa Antibiotics Hives  . Sulfasalazine Other (See Comments)  . Amoxicillin Rash    Per [facility]  . Apap [Acetaminophen] Rash     Review of Systems: Review of Systems  Constitutional: Negative for chills, fever and weight loss.  Respiratory: Negative for cough, shortness of breath and wheezing.   Cardiovascular: Negative for chest pain and leg swelling.  Gastrointestinal: Negative for abdominal pain,  constipation, diarrhea, nausea and vomiting.  Skin: Negative for rash.      OBJECTIVE: Vitals:   11/27/19 1515 11/27/19 2048 11/28/19 0538 11/28/19 0856  BP: (!) 169/89 (!) 151/87 (!) 168/92 (!) 179/101  Pulse:  71 69 79  Resp:  17 17 16   Temp:  98.2 F (36.8 C) 98.6 F (37 C) 99.4 F (37.4 C)  TempSrc:    Oral  SpO2:  97% 94% 96%  Weight:  111.6 kg    Height:       Body mass index is 28.44 kg/m.  Physical Exam Constitutional:      General: He is not in acute distress.    Appearance: He is well-developed.  Cardiovascular:     Rate and Rhythm: Normal rate and regular rhythm.     Heart sounds: Normal heart sounds.  Pulmonary:     Effort:  Pulmonary effort is normal.     Breath sounds: Normal breath sounds.  Skin:    General: Skin is warm and dry.  Neurological:     Mental Status: He is alert and oriented to person, place, and time.     Lab Results Lab Results  Component Value Date   WBC 4.8 11/28/2019   HGB 9.0 (L) 11/28/2019   HCT 28.4 (L) 11/28/2019   MCV 99.0 11/28/2019   PLT 164 11/28/2019    Lab Results  Component Value Date   CREATININE 2.26 (H) 11/28/2019   BUN 28 (H) 11/28/2019   NA 141 11/28/2019   K 3.7 11/28/2019   CL 107 11/28/2019   CO2 25 11/28/2019    Lab Results  Component Value Date   ALT 15 11/26/2019   AST 29 11/26/2019   ALKPHOS 55 11/26/2019   BILITOT 0.5 11/26/2019     Microbiology: Recent Results (from the past 240 hour(s))  Blood Cultures x 2 sites     Status: None (Preliminary result)   Collection Time: 11/24/19 10:50 AM   Specimen: BLOOD LEFT HAND  Result Value Ref Range Status   Specimen Description BLOOD LEFT HAND  Final   Special Requests   Final    BOTTLES DRAWN AEROBIC AND ANAEROBIC Blood Culture results may not be optimal due to an inadequate volume of blood received in culture bottles   Culture   Final    NO GROWTH 3 DAYS Performed at [facility], [place] [address] [place], [place] [address]    Report Status PENDING  Incomplete  Blood Cultures x 2 sites     Status: None (Preliminary result)   Collection Time: 11/24/19 10:55 AM   Specimen: BLOOD RIGHT HAND  Result Value Ref Range Status   Specimen Description BLOOD RIGHT HAND  Final   Special Requests   Final    BOTTLES DRAWN AEROBIC AND ANAEROBIC Blood Culture adequate volume   Culture   Final    NO GROWTH 3 DAYS Performed at [facility], [place] [address] [place], [place] [address]    Report Status PENDING  Incomplete  MRSA PCR Screening     Status: None   Collection Time: 11/24/19  2:59 PM   Specimen: Nasopharyngeal  Result Value Ref Range Status   MRSA by PCR NEGATIVE NEGATIVE Final     Comment:        The GeneXpert MRSA Assay (FDA approved for NASAL specimens only), is one component of a comprehensive MRSA colonization surveillance program. It is not intended to diagnose MRSA infection nor to guide or monitor treatment  for MRSA infections. Performed at [facility], [place] [address], [place], [place] [address]   Aerobic/Anaerobic Culture (surgical/deep wound)     Status: None (Preliminary result)   Collection Time: 11/26/19  2:41 PM   Specimen: NEEDLE ASPIRATE  Result Value Ref Range Status   Specimen Description NEEDLE ASPIRATE DISC  Final   Special Requests NONE  Final   Gram Stain NO WBC SEEN NO ORGANISMS SEEN   Final   Culture   Final    NO GROWTH 2 DAYS Performed at [facility], [place] [address], [place],  [address]    Report Status PENDING  Incomplete     Terri Piedra, [place] [facility] [phone] Pager  11/28/2019  1:24 PM

## 2019-11-28 NOTE — Progress Notes (Signed)
PROGRESS NOTE    ANGELA ANDAYA  [id number]  DOB: 1951/01/16  PCP: Antonietta Jewel, MD Admit date:11/24/2019  Hospital course:68 y.o.malewith h/oCVA, HTN, hyperlipidemia,Gout,DM, CBP,PAD,s/pright BKA, chronic anemia, who was admitted on 1/31 to [facility] a fall with inability to get up on his own- prolonged down time. He was found to have a LLE wound/cellulitis with concern for outpatient treatment failure with Clindamycin. He was treated with IV Dapto with plan to transition to Linezolid at d/c (due to h/o MRSA bacteremia/diskitis in Nov 2020). On the evening of 2/3, he developed abdominal pain with n/v with coffee ground emesis that was gastroccult positive and so he was transferred to [facility] for GI evaluation. Patient also c/o severe back pain. Found to have epidural abscess likely from his previous MRSA infection.Underwent IR guided drainage.Currently further plan is IV daptomycin treatment.  Subjective:  Patient states weakness in his right leg is much improved but still has severe back pain in right paraspinal area that radiates to his low back 8/10.   Objective: Vitals:   11/27/19 1300 11/27/19 1515 11/27/19 2048 11/28/19 0538  BP: (!) 164/100 (!) 169/89 (!) 151/87 (!) 168/92  Pulse:   71 69  Resp:   17 17  Temp:   98.2 F (36.8 C) 98.6 F (37 C)  TempSrc:      SpO2:   97% 94%  Weight:   111.6 kg   Height:        Intake/Output Summary (Last 24 hours) at 11/28/2019 0835 Last data filed at 11/28/2019 0700 Gross per 24 hour  Intake 930.57 ml  Output 2650 ml  Net -1719.43 ml   Filed Weights   11/25/19 1035 11/26/19 2330 11/27/19 2048  Weight: 109 kg 111.6 kg 111.6 kg    Physical Examination:  General exam: Appears calm and comfortable  Respiratory system: Clear to auscultation. Respiratory effort normal. Cardiovascular system: S1 & S2 heard, RRR. No JVD, murmurs, rubs, gallops or clicks. No pedal edema. Gastrointestinal system: Abdomen is nondistended, soft and  nontender. Normal bowel sounds heard. Central nervous system: Alert and oriented. Able to move all extremities against gravity spontaneously although has some pain in back with RLE movt.  Extremities: s/p right BKA  Skin: No rashes, lesions or ulcers Psychiatry: Judgement and insight appear normal. Mood & affect appropriate.   Data Reviewed: I have personally reviewed following labs and imaging studies  CBC: Recent Labs  Lab 11/24/19 0614 11/24/19 1100 11/25/19 0420 11/26/19 0754 11/28/19 0505  WBC 10.1 8.5 5.7 3.8* 4.8  NEUTROABS 9.2* 7.4  --  3.0  --   HGB 10.8* 10.9* 9.5* 8.9* 9.0*  HCT 34.8* 35.1* 30.7* 28.7* 28.4*  MCV 100.9* 100.0 100.7* 98.6 99.0  PLT 195 202 160 125* [id number]   Basic Metabolic Panel: Recent Labs  Lab 11/24/19 0614 11/25/19 0420 11/26/19 0754 11/28/19 0505  NA 141 143 141 141  K 4.1 4.3 3.7 3.7  CL 102 105 104 107  CO2 24 28 26 25   GLUCOSE 198* 141* 152* 157*  BUN 42* 44* 39* 28*  CREATININE 2.73* 2.83* 2.67* 2.26*  CALCIUM 9.1 8.8* 8.3* 8.5*  MG  --   --   --  1.7   GFR: Estimated Creatinine Clearance: 45.2 mL/min (A) (by C-G formula based on SCr of 2.26 mg/dL (H)). Liver Function Tests: Recent Labs  Lab 11/24/19 0614 11/26/19 0754  AST 27 29  ALT 13 15  ALKPHOS 75 55  BILITOT 0.7 0.5  PROT 8.1 6.6  ALBUMIN  3.2* 2.6*   No results for input(s): LIPASE, AMYLASE in the last 168 hours. No results for input(s): AMMONIA in the last 168 hours. Coagulation Profile: Recent Labs  Lab 11/24/19 0614 11/26/19 0754  INR 1.0 1.2   Cardiac Enzymes: Recent Labs  Lab 11/28/19 0505  CKTOTAL 38*   BNP (last 3 results) No results for input(s): PROBNP in the last 8760 hours. HbA1C: No results for input(s): HGBA1C in the last 72 hours. CBG: Recent Labs  Lab 11/27/19 0712 11/27/19 1119 11/27/19 1615 11/27/19 2047 11/28/19 0659  GLUCAP 133* 129* 130* 171* 151*   Lipid Profile: No results for input(s): CHOL, HDL, LDLCALC, TRIG, CHOLHDL,  LDLDIRECT in the last 72 hours. Thyroid Function Tests: No results for input(s): TSH, T4TOTAL, FREET4, T3FREE, THYROIDAB in the last 72 hours. Anemia Panel: No results for input(s): VITAMINB12, FOLATE, FERRITIN, TIBC, IRON, RETICCTPCT in the last 72 hours. Sepsis Labs: No results for input(s): PROCALCITON, LATICACIDVEN in the last 168 hours.  Recent Results (from the past 240 hour(s))  Blood Cultures x 2 sites     Status: None (Preliminary result)   Collection Time: 11/24/19 10:50 AM   Specimen: BLOOD LEFT HAND  Result Value Ref Range Status   Specimen Description BLOOD LEFT HAND  Final   Special Requests   Final    BOTTLES DRAWN AEROBIC AND ANAEROBIC Blood Culture results may not be optimal due to an inadequate volume of blood received in culture bottles   Culture   Final    NO GROWTH 3 DAYS Performed at [facility], [place] [address] [place], [place] [address]    Report Status PENDING  Incomplete  Blood Cultures x 2 sites     Status: None (Preliminary result)   Collection Time: 11/24/19 10:55 AM   Specimen: BLOOD RIGHT HAND  Result Value Ref Range Status   Specimen Description BLOOD RIGHT HAND  Final   Special Requests   Final    BOTTLES DRAWN AEROBIC AND ANAEROBIC Blood Culture adequate volume   Culture   Final    NO GROWTH 3 DAYS Performed at [facility], [place] [address], [place],  [address]    Report Status PENDING  Incomplete  MRSA PCR Screening     Status: None   Collection Time: 11/24/19  2:59 PM   Specimen: Nasopharyngeal  Result Value Ref Range Status   MRSA by PCR NEGATIVE NEGATIVE Final    Comment:        The GeneXpert MRSA Assay (FDA approved for NASAL specimens only), is one component of a comprehensive MRSA colonization surveillance program. It is not intended to diagnose MRSA infection nor to guide or monitor treatment for MRSA infections. Performed at [facility], [place] [address] [place],  [address]   Aerobic/Anaerobic  Culture (surgical/deep wound)     Status: None (Preliminary result)   Collection Time: 11/26/19  2:41 PM   Specimen: NEEDLE ASPIRATE  Result Value Ref Range Status   Specimen Description NEEDLE ASPIRATE DISC  Final   Special Requests NONE  Final   Gram Stain NO WBC SEEN NO ORGANISMS SEEN   Final   Culture   Final    NO GROWTH < 24 HOURS Performed at [facility], [place] [address], [place],  [address]    Report Status PENDING  Incomplete      Radiology Studies: IR LUMBAR [place] ASPIRATION W/IMG GUIDE  Result Date: 11/27/2019 INDICATION: 69 year old male referred for disc aspiration with  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 The antibiotics were administered within an appropriate time frame prior to the initiation of the procedure. ANESTHESIA/SEDATION: Fentanyl 100 mcg IV; Versed 2.0 mg IV Moderate Sedation Time:  10 minutes The patient was continuously monitored during the procedure by the interventional radiology nurse under my direct supervision. COMPLICATIONS: None PROCEDURE: Informed written consent was obtained from the patient after a thorough discussion of the procedural risks, benefits and alternatives. All questions were addressed. Maximal Sterile Barrier Technique was utilized including caps, mask, sterile gowns, sterile gloves, sterile drape, hand hygiene and skin antiseptic. A timeout was performed prior to the initiation of the procedure. Patient was position prone position on the fluoroscopy table. Scout images were acquired to confirm the targeted level T10-T11. 1% lidocaine was used for local anesthesia. A 20 gauge needle was then advanced from a left posterior oblique orientation into the disc space foreign aspirate. Approximately 5 cc of serosanguineous fluid aspirated for a sample. Needles removed and sterile bandage was placed. Patient  tolerated the procedure well and remained hemodynamically stable throughout. No complications were encountered and no significant blood loss. IMPRESSION: Status post fluoroscopic guided disc aspirate of T10-T11. Signed, Dulcy Fanny. Dellia Nims, RPVI [facility] Electronically Signed   By: Corrie Mckusick D.O.   On: 11/27/2019 08:26        Scheduled Meds: . allopurinol  100 mg Oral BID  . amLODipine  10 mg Oral Daily  . cloNIDine  0.3 mg Oral TID  . feeding supplement  1 Container Oral BID BM  . finasteride  5 mg Oral Daily  . heparin injection (subcutaneous)  5,000 Units Subcutaneous Q8H  . hydrALAZINE  50 mg Oral Q8H  . insulin aspart  0-15 Units Subcutaneous TID WC  . insulin aspart  0-5 Units Subcutaneous QHS  . levothyroxine  50 mcg Oral Q0600  . metoprolol tartrate  50 mg Oral BID  . nutrition supplement (JUVEN)  1 packet Oral BID BM  . oxyCODONE  30 mg Oral TID  . pantoprazole  40 mg Oral Daily  . sodium chloride flush  3 mL Intravenous Q12H  . tamsulosin  0.4 mg Oral Daily   Continuous Infusions: . sodium chloride Stopped (11/27/19 2024)  . DAPTOmycin (CUBICIN)  IV Stopped (11/27/19 1729)    Assessment & Plan:   1.  T10-T11 discitis with epidural abscess, MRSA bacteremia. He was previously treated at [facility] from 11/11-12/20 for T10-11 discitis and MRSA bacteremia. He was treated with IV Daptomycin as inpatient and transitioned to [place] at the time of d/c to home. He was discharged with home health.MRI this admission shows evidence of epidural abscess. Neuro surgery, Infectious disease consulted. Now S/P IR guided drainage of the epidural abscess (on 11/26/2019).Started back on IV daptomycin 2/6 after discussion with ID--monitor CK levels.Follow-up on cultures. Appreciate neurosurgical consultation as well.  2.  Hematemesis.H&H remained stable.SP upper GI endoscopy by Dr. Benson Norway.No evidence of acute abnormality.Continue PPI  patient is back on regular diet.  Avoid NSAIDs.DVT prophylaxis resumed today with heparin  3.  Bilateral lower extremity ulcers-Follow-up on ABI.Currently on IV antibiotics.  4.  BPH-Continue Flomax and Proscar  5.  Chronic kidney disease stage IV-Renal function stable.Monitor.Avoid nephrotoxic medications/contrasted studies  6.  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  7.  Essential hypertension-Blood pressure currently stable.Continue home  regimen.  8.  Chronic pain syndrome.Patient is on chronic oxycodone 30 mg 3 times daily as well as Zanaflex. Now, additionally, he is on Oxycodone 10 mg every 4 hours PRN and Dilaudid 0.5 mg IV for severe pain--will try lidocaine patch.  9.  History of gout.Continue allopurinol.  10. Left heel and foot unstageable ulcer.  POA.Dressing changes per wound care.     DVT prophylaxis: Heparin today Code Status: Full code Family / Patient Communication: d/w patient and answered all questions to satisfaction Disposition Plan: Ongoing need for IV abx. Seen by PT/OT and recommended CIR eval     LOS: 4 days    Time spent: 35 mins    Guilford Shi, MD [facility] Pager in [place]  If 7PM-7AM, please contact night-coverage [email or web address] Password [facility] 11/28/2019, 8:35 AM

## 2019-11-28 NOTE — Evaluation (Signed)
Occupational Therapy Evaluation Patient Details Name: Thomas [name] MRN: [id number] DOB: [birth year] Today's Date: 11/28/2019    History of Present Illness Patient is a 69 year old male history of diabetes mellitus type 2, hypertension, chronic kidney disease stage III, anemia, R BKA Sept 2019, who was admitted at [facility] after having fever chills and back pain. Patient transferred from [facility] for MRSA bacteremia. CT abdomen showing discitis involvling T10, T11 area.   Clinical Impression   Pt with decline in function and safety with ADLs and ADL mobility with impaired endurance and limited by pain. Pt refuses EOB and OOB activity due to back and abdominal pain; eval limited. Pt reports that he live sat home alone and was independent with ADLs/selfcrae, home mgt and mobility. Pt currently requires assist at bed level for LB ADLs. Pt able to roll in bed with sup. Pt would benefit from acute OT services to address impairments to maximize level of function and safety    Follow Up Recommendations  CIR    Equipment Recommendations  Other (comment)(TBD at next venue of care)    Recommendations for Other Services       Precautions / Restrictions Precautions Precautions: Back;Fall Precaution Booklet Issued: No Precaution Comments: has full lumbar fusion Restrictions Weight Bearing Restrictions: No      Mobility Bed Mobility Overal bed mobility: Needs Assistance Bed Mobility: Rolling Rolling: Supervision         General bed mobility comments: declined to sit up on side of bed, limited tolerance for mobility  Transfers Overall transfer level: Needs assistance               General transfer comment: declined to try to sit up due to back and abd pain from his fall at home    Balance                                           ADL either performed or assessed with clinical judgement   ADL Overall ADL's : Needs  assistance/impaired Eating/Feeding: Set up;Independent;Sitting;Bed level   Grooming: Wash/dry hands;Wash/dry face;Set up;Bed level   Upper Body Bathing: Set up;Supervision/ safety;Bed level   Lower Body Bathing: Moderate assistance   Upper Body Dressing : Set up;Supervision/safety;Bed level   Lower Body Dressing: Moderate assistance     Toilet Transfer Details (indicate cue type and reason): pt declined due to back and abdominal pain Toileting- Clothing Manipulation and Hygiene: Total assistance;Bed level         General ADL Comments: pt declined any selfcare/ADL tasks sitting EOB or OOB due to pain in back and abdomen     Vision Baseline Vision/History: Wears glasses Wears Glasses: At all times Patient Visual Report: No change from baseline       Perception     Praxis      Pertinent Vitals/Pain Pain Assessment: Faces Faces Pain Scale: Hurts even more Pain Location: lumbar spine with any back movement Pain Descriptors / Indicators: Grimacing;Guarding Pain Intervention(s): Limited activity within patient's tolerance;Premedicated before session;Monitored during session;Repositioned;Patient requesting pain meds-RN notified     Hand Dominance Right   Extremity/Trunk Assessment Upper Extremity Assessment Upper Extremity Assessment: Overall WFL for tasks assessed   Lower Extremity Assessment Lower Extremity Assessment: Defer to PT evaluation   Cervical / Trunk Assessment Cervical / Trunk Assessment: Other exceptions   Communication Communication Communication: No difficulties   Cognition  Arousal/Alertness: Awake/alert Behavior During Therapy: Anxious Overall Cognitive Status: Within Functional Limits for tasks assessed                                 General Comments: able to describe his prior functional level which was quite independent prior to his fall and trip to [place]  pt was in bed the entire visit despite PT returning x  2 for meds to be effective for pain.  He is unable to flex his abdomen, unable to be assisted to sit up on side of bed per pt.  Talked with nursing about his limitations, has been medicated twice before PT arrived    Exercises Exercises: Other exercises(LE strength was assessed and noted 4 to 4+ throughout )   Shoulder Instructions      Home Living Family/patient expects to be discharged to:: Private residence Living Arrangements: Alone Available Help at Discharge: Neighbor;Available PRN/intermittently Type of Home: House Home Access: Stairs to enter [employer] of Steps: 1 Entrance Stairs-Rails: None Home Layout: One level     Bathroom Shower/Tub: Teacher, early years/pre: Standard     Home Equipment: Cane - single point;Crutches   Additional Comments: used crutch on LUE and cane on RUE      Prior Functioning/Environment Level of Independence: Independent with assistive device(s)        Comments: patient reports ambulating with a cane and a crutch for 3 months. Reports just received R LE prosthesis prior to admission, independent with ADLs/selfcare (sponge bathes at sink)        OT Problem List: Decreased safety awareness;Pain;Decreased activity tolerance;Decreased knowledge of use of DME or AE      OT Treatment/Interventions: Self-care/ADL training;DME and/or AE instruction;Therapeutic activities;Therapeutic exercise;Patient/family education    OT Goals(Current goals can be found in the care plan section) Acute Rehab OT Goals Patient Stated Goal: to get stronger and pain to go OT Goal Formulation: With patient Time For Goal Achievement: 12/12/19 Potential to Achieve Goals: Good ADL Goals Pt Will Perform Grooming: with set-up;with supervision;sitting Pt Will Perform Upper Body Bathing: with supervision;with set-up;sitting Pt Will Perform Lower Body Bathing: with mod assist;with min assist;with adaptive equipment Pt Will Perform Upper Body  Dressing: with supervision;with set-up;sitting Pt Will Perform Lower Body Dressing: with mod assist;with min assist;with adaptive equipment Pt Will Transfer to Toilet: with mod assist;with min assist;ambulating Pt Will Perform Toileting - Clothing Manipulation and hygiene: with mod assist;with min assist;sit to/from stand  OT Frequency: Min 2X/week   Barriers to D/C:            Co-evaluation              AM-PAC OT "6 Clicks" Daily Activity     Outcome Measure Help from another person eating meals?: None Help from another person taking care of personal grooming?: A Little Help from another person toileting, which includes using toliet, bedpan, or urinal?: Total Help from another person bathing (including washing, rinsing, drying)?: A Lot Help from another person to put on and taking off regular upper body clothing?: A Little Help from another person to put on and taking off regular lower body clothing?: Total 6 Click Score: 14   End of Session    Activity Tolerance: Patient limited by pain Patient left: in bed;with call bell/phone within reach;Other (comment)(MD in to see pt)  OT Visit Diagnosis: Other abnormalities of gait and mobility (  R26.89);Pain Pain - part of body: (back, abdomen)                Time: [id number] OT Time Calculation (min): 23 min Charges:  OT General Charges $OT Visit: 1 Visit OT Evaluation $OT Eval Moderate Complexity: 1 Mod    Britt Bottom 11/28/2019, 3:03 PM

## 2019-11-28 NOTE — Plan of Care (Signed)
  Problem: Activity: Goal: Risk for activity intolerance will decrease Outcome: Progressing   Problem: Coping: Goal: Level of anxiety will decrease Outcome: Progressing   

## 2019-11-28 NOTE — Evaluation (Signed)
Physical Therapy Evaluation Patient Details Name: Thomas [name] MRN: [id number] DOB: 1951-02-04 Today's Date: 11/28/2019   History of Present Illness  Patient is a 69 year old male history of diabetes mellitus type 2, hypertension, chronic kidney disease stage III, anemia, R BKA Sept 2019, who was admitted at [facility] after having fever chills and back pain. Patient transferred from [facility] for MRSA bacteremia. CT abdomen showing discitis involvling T10, T11 area.  Clinical Impression  Pt was seen for mobility and strength testing, and will expect to need another visit to get more definitive values of assistance needed.  Pain is primary limitation, and has plan to work with two person assist next visit to don prosthesis and try to walk if possible.  Pt reports he is quite motivated to do all this.    Follow Up Recommendations CIR    Equipment Recommendations  None recommended by PT    Recommendations for Other Services Rehab consult     Precautions / Restrictions Precautions Precautions: Back;Fall Precaution Booklet Issued: No Precaution Comments: has full lumbar fusion Restrictions Weight Bearing Restrictions: No      Mobility  Bed Mobility Overal bed mobility: Needs Assistance Bed Mobility: Rolling Rolling: Supervision         General bed mobility comments: declined to sit up on side of bed, limited tolerance for mobility  Transfers Overall transfer level: Needs assistance               General transfer comment: declined to try to sit up due to back and abd pain from his fall at home  Ambulation/Gait             General Gait Details: unable to try  Stairs            Wheelchair Mobility    Modified Rankin (Stroke Patients Only)       Balance                                             Pertinent Vitals/Pain Pain Assessment: Faces Faces Pain Scale: Hurts whole lot Pain Location: lumbar spine with any  back movement Pain Descriptors / Indicators: Grimacing;Guarding Pain Intervention(s): Limited activity within patient's tolerance;Monitored during session;Premedicated before session;Repositioned    Home Living Family/patient expects to be discharged to:: Private residence Living Arrangements: Alone Available Help at Discharge: Neighbor;Available PRN/intermittently Type of Home: House Home Access: Stairs to enter Entrance Stairs-Rails: None Entrance Stairs-Number of Steps: 1 Home Layout: One level Home Equipment: Cane - single point;Crutches Additional Comments: used crutch on LUE and cane on RUE    Prior Function Level of Independence: Independent with assistive device(s)         Comments: patient reports ambulating with a cane and a crutch for 3 months. Reports just received R LE prosthesis prior to admission     Hand Dominance   Dominant Hand: Right    Extremity/Trunk Assessment   Upper Extremity Assessment Upper Extremity Assessment: Overall WFL for tasks assessed    Lower Extremity Assessment Lower Extremity Assessment: Overall WFL for tasks assessed    Cervical / Trunk Assessment Cervical / Trunk Assessment: Other exceptions(did not sit up so was difficult to assess)  Communication   Communication: No difficulties  Cognition Arousal/Alertness: Awake/alert Behavior During Therapy: Anxious Overall Cognitive Status: Within Functional Limits for tasks assessed  General Comments: able to describe his prior functional level which was quite independent prior to his fall and trip to [place] comments (skin integrity, edema, etc.): pt was in bed the entire visit despite PT returning x 2 for meds to be effective for pain.  He is unable to flex his abdomen, unable to be assisted to sit up on side of bed per pt.  Talked with nursing about his limitations, has been medicated twice before PT arrived     Exercises     Assessment/Plan    PT Assessment Patient needs continued PT services  PT Problem List Decreased strength;Decreased range of motion;Decreased activity tolerance;Decreased mobility;Pain;Decreased skin integrity       PT Treatment Interventions DME instruction;Gait training;Functional mobility training;Therapeutic activities;Therapeutic exercise;Balance training;Neuromuscular re-education;Patient/family education    PT Goals (Current goals can be found in the Care Plan section)  Acute Rehab PT Goals Patient Stated Goal: to get stronger and pain to go PT Goal Formulation: With patient Time For Goal Achievement: 12/12/19 Potential to Achieve Goals: Good    Frequency Min 3X/week   Barriers to discharge Decreased caregiver support home alone with no family support for around the clock    Co-evaluation               AM-PAC PT "6 Clicks" Mobility  Outcome Measure Help needed turning from your back to your side while in a flat bed without using bedrails?: None Help needed moving from lying on your back to sitting on the side of a flat bed without using bedrails?: A Little Help needed moving to and from a bed to a chair (including a wheelchair)?: A Lot Help needed standing up from a chair using your arms (e.g., wheelchair or bedside chair)?: A Lot Help needed to walk in hospital room?: A Lot Help needed climbing 3-5 steps with a railing? : Total 6 Click Score: 14    End of Session   Activity Tolerance: Patient limited by pain Patient left: in bed;with call bell/phone within reach;with bed alarm set Nurse Communication: Mobility status;Patient requests pain meds PT Visit Diagnosis: History of falling (Z91.81);Pain Pain - Right/Left: Right Pain - part of body: (spine and abdomen)    Time: [id number] PT Time Calculation (min) (ACUTE ONLY): 23 min   Charges:   PT Evaluation $PT Eval Moderate Complexity: 1 Mod PT Treatments $Therapeutic Exercise: 8-22 mins        Ramond Dial 11/28/2019, 1:53 PM   Mee Hives, PT MS Acute Rehab Dept. Number: [place] [facility] [place]

## 2019-11-28 NOTE — Progress Notes (Signed)
Pt only wants to stay on back. Pt refuses to be turned. RN informed pt that he can cause pressure sores and skin breakdown from not turning, but pt still refuses to be turned due to chronic back pain. He states that he feels the best, regarding his pain, lying on his back.   Eleanora Neighbor, RN

## 2019-11-29 DIAGNOSIS — M4620 Osteomyelitis of vertebra, site unspecified: Secondary | ICD-10-CM

## 2019-11-29 DIAGNOSIS — G062 Extradural and subdural abscess, unspecified: Secondary | ICD-10-CM

## 2019-11-29 LAB — CULTURE, BLOOD (ROUTINE X 2)
Culture: NO GROWTH
Culture: NO GROWTH
Special Requests: ADEQUATE

## 2019-11-29 LAB — CBC
HCT: 28 % — ABNORMAL LOW (ref 39.0–52.0)
Hemoglobin: 8.9 g/dL — ABNORMAL LOW (ref 13.0–17.0)
MCH: 31.4 pg (ref 26.0–34.0)
MCHC: 31.8 g/dL (ref 30.0–36.0)
MCV: 98.9 fL (ref 80.0–100.0)
Platelets: 177 10*3/uL (ref 150–400)
RBC: 2.83 MIL/uL — ABNORMAL LOW (ref 4.22–5.81)
RDW: 14.1 % (ref 11.5–15.5)
WBC: 9.2 10*3/uL (ref 4.0–10.5)
nRBC: 0 % (ref 0.0–0.2)

## 2019-11-29 LAB — GLUCOSE, CAPILLARY
Glucose-Capillary: 211 mg/dL — ABNORMAL HIGH (ref 70–99)
Glucose-Capillary: 124 mg/dL — ABNORMAL HIGH (ref 70–99)
Glucose-Capillary: 136 mg/dL — ABNORMAL HIGH (ref 70–99)
Glucose-Capillary: 159 mg/dL — ABNORMAL HIGH (ref 70–99)

## 2019-11-29 MED ORDER — HYDROMORPHONE HCL 1 MG/ML IJ SOLN: 1.0000 mg | INTRAMUSCULAR | Status: DC | PRN

## 2019-11-29 MED ORDER — HYDROMORPHONE HCL 1 MG/ML IJ SOLN
1.0000 mg | Freq: Four times a day (QID) | INTRAMUSCULAR | Status: DC | PRN
  Administered 2019-11-29 – 2019-12-09 (×31): 1 mg via INTRAVENOUS
  Filled 2019-11-29 (×31): qty 1

## 2019-11-29 MED FILL — Ondansetron HCl Inj 4 MG/2ML (2 MG/ML): INTRAMUSCULAR | Qty: 2 | Status: AC

## 2019-11-29 MED FILL — Phenylephrine HCl IV Soln 10 MG/ML: INTRAVENOUS | Qty: 1 | Status: AC

## 2019-11-29 MED FILL — Propofol IV Emul 200 MG/20ML (10 MG/ML): INTRAVENOUS | Qty: 20 | Status: AC

## 2019-11-29 MED FILL — Ephedrine Sulfate Inj 50 MG/ML: INTRAMUSCULAR | Qty: 1 | Status: AC

## 2019-11-29 NOTE — Progress Notes (Signed)
PROGRESS NOTE    Thomas [name]  [id number]  DOB: 1950-12-24  PCP: Antonietta Jewel, MD Admit date:11/24/2019  Hospital course:68 y.o.malewith h/oCVA, HTN, hyperlipidemia,Gout,DM, CBP,PAD,s/pright BKA, chronic anemia, who was admitted on 1/31 to [place] a fall with inability to get up on his own- prolonged down time. He was found to have a LLE wound/cellulitis with concern for outpatient treatment failure with Clindamycin. He was treated with IV Dapto with plan to transition to Linezolid at d/c (due to h/o MRSA bacteremia/diskitis in Nov 2020). On the evening of 2/3, he developed abdominal pain with n/v with coffee ground emesis that was gastroccult positive and so he was transferred to [facility] for GI evaluation. Patient also c/o severe back pain. Found to have epidural abscess likely from his previous MRSA infection.Underwent IR guided drainage.Currently plan is IV daptomycin prolonged abx treatment.  Subjective:   Continues to c/o severe back pain in right paraspinal area , lidocaine not helping and requesting to escalate meds.   Objective: Vitals:   11/29/19 0536 11/29/19 0855 11/29/19 0900 11/29/19 1414  BP: (!) 175/88 (!) 172/88  (!) 160/85  Pulse: 74  89   Resp: 20     Temp: 98.8 F (37.1 C)     TempSrc: Oral     SpO2: 97%     Weight:      Height:        Intake/Output Summary (Last 24 hours) at 11/29/2019 1614 Last data filed at 11/29/2019 1419 Gross per 24 hour  Intake 660 ml  Output 2000 ml  Net -1340 ml   Filed Weights   11/25/19 1035 11/26/19 2330 11/27/19 2048  Weight: 109 kg 111.6 kg 111.6 kg    Physical Examination:  General exam: Appears calm and comfortable  Respiratory system: Clear to auscultation. Respiratory effort normal. Cardiovascular system: S1 & S2 heard, RRR. No JVD, murmurs, rubs, gallops or clicks. No pedal edema. Gastrointestinal system: Abdomen is nondistended, soft and nontender. Normal bowel sounds heard. Central nervous system: Alert and  oriented. Able to move all extremities against gravity spontaneously although has some pain in back with RLE movt.  Extremities: s/p right BKA  Skin: No rashes, lesions or ulcers Psychiatry: Judgement and insight appear normal. Mood & affect appropriate.   Data Reviewed: I have personally reviewed following labs and imaging studies  CBC: Recent Labs  Lab 11/24/19 0614 11/24/19 0614 11/24/19 1100 11/25/19 0420 11/26/19 0754 11/28/19 0505 11/29/19 0557  WBC 10.1   < > 8.5 5.7 3.8* 4.8 9.2  NEUTROABS 9.2*  --  7.4  --  3.0  --   --   HGB 10.8*   < > 10.9* 9.5* 8.9* 9.0* 8.9*  HCT 34.8*   < > 35.1* 30.7* 28.7* 28.4* 28.0*  MCV 100.9*   < > 100.0 100.7* 98.6 99.0 98.9  PLT 195   < > 202 160 125* 164 177   < > = values in this interval not displayed.   Basic Metabolic Panel: Recent Labs  Lab 11/24/19 0614 11/25/19 0420 11/26/19 0754 11/28/19 0505  NA 141 143 141 141  K 4.1 4.3 3.7 3.7  CL 102 105 104 107  CO2 24 28 26 25   GLUCOSE 198* 141* 152* 157*  BUN 42* 44* 39* 28*  CREATININE 2.73* 2.83* 2.67* 2.26*  CALCIUM 9.1 8.8* 8.3* 8.5*  MG  --   --   --  1.7   GFR: Estimated Creatinine Clearance: 45.2 mL/min (A) (by C-G formula based on SCr of 2.26  mg/dL (H)). Liver Function Tests: Recent Labs  Lab 11/24/19 0614 11/26/19 0754  AST 27 29  ALT 13 15  ALKPHOS 75 55  BILITOT 0.7 0.5  PROT 8.1 6.6  ALBUMIN 3.2* 2.6*   No results for input(s): LIPASE, AMYLASE in the last 168 hours. No results for input(s): AMMONIA in the last 168 hours. Coagulation Profile: Recent Labs  Lab 11/24/19 0614 11/26/19 0754  INR 1.0 1.2   Cardiac Enzymes: Recent Labs  Lab 11/28/19 0505  CKTOTAL 38*   BNP (last 3 results) No results for input(s): PROBNP in the last 8760 hours. HbA1C: No results for input(s): HGBA1C in the last 72 hours. CBG: Recent Labs  Lab 11/28/19 1655 11/28/19 2102 11/29/19 0701 11/29/19 1130 11/29/19 1557  GLUCAP 161* 160* 211* 124* 136*   Lipid  Profile: No results for input(s): CHOL, HDL, LDLCALC, TRIG, CHOLHDL, LDLDIRECT in the last 72 hours. Thyroid Function Tests: No results for input(s): TSH, T4TOTAL, FREET4, T3FREE, THYROIDAB in the last 72 hours. Anemia Panel: No results for input(s): VITAMINB12, FOLATE, FERRITIN, TIBC, IRON, RETICCTPCT in the last 72 hours. Sepsis Labs: No results for input(s): PROCALCITON, LATICACIDVEN in the last 168 hours.  Recent Results (from the past 240 hour(s))  Blood Cultures x 2 sites     Status: None   Collection Time: 11/24/19 10:50 AM   Specimen: BLOOD LEFT HAND  Result Value Ref Range Status   Specimen Description BLOOD LEFT HAND  Final   Special Requests   Final    BOTTLES DRAWN AEROBIC AND ANAEROBIC Blood Culture results may not be optimal due to an inadequate volume of blood received in culture bottles   Culture   Final    NO GROWTH 5 DAYS Performed at [facility], [place] [address] [place], [place] [address]    Report Status 11/29/2019 FINAL  Final  Blood Cultures x 2 sites     Status: None   Collection Time: 11/24/19 10:55 AM   Specimen: BLOOD RIGHT HAND  Result Value Ref Range Status   Specimen Description BLOOD RIGHT HAND  Final   Special Requests   Final    BOTTLES DRAWN AEROBIC AND ANAEROBIC Blood Culture adequate volume   Culture   Final    NO GROWTH 5 DAYS Performed at [facility], [place] [address], [place], [place] [address]    Report Status 11/29/2019 FINAL  Final  MRSA PCR Screening     Status: None   Collection Time: 11/24/19  2:59 PM   Specimen: Nasopharyngeal  Result Value Ref Range Status   MRSA by PCR NEGATIVE NEGATIVE Final    Comment:        The GeneXpert MRSA Assay (FDA approved for NASAL specimens only), is one component of a comprehensive MRSA colonization surveillance program. It is not intended to diagnose MRSA infection nor to guide or monitor treatment for MRSA infections. Performed at [facility], [place] [address],  [place], [place] [address]   Aerobic/Anaerobic Culture (surgical/deep wound)     Status: None (Preliminary result)   Collection Time: 11/26/19  2:41 PM   Specimen: NEEDLE ASPIRATE  Result Value Ref Range Status   Specimen Description NEEDLE ASPIRATE DISC  Final   Special Requests NONE  Final   Gram Stain NO WBC SEEN NO ORGANISMS SEEN   Final   Culture   Final    NO GROWTH 3 DAYS Performed at [facility], [place] [address], [place], [place] [address]  Report Status PENDING  Incomplete      Radiology Studies: No results found.      Scheduled Meds: . allopurinol  100 mg Oral BID  . amLODipine  10 mg Oral Daily  . cloNIDine  0.3 mg Oral TID  . feeding supplement  1 Container Oral BID BM  . finasteride  5 mg Oral Daily  . heparin injection (subcutaneous)  5,000 Units Subcutaneous Q8H  . hydrALAZINE  50 mg Oral Q8H  . insulin aspart  0-15 Units Subcutaneous TID WC  . insulin aspart  0-5 Units Subcutaneous QHS  . levothyroxine  50 mcg Oral Q0600  . lidocaine  2 patch Transdermal Q24H  . metoprolol tartrate  50 mg Oral BID  . nutrition supplement (JUVEN)  1 packet Oral BID BM  . oxyCODONE  30 mg Oral TID  . pantoprazole  40 mg Oral Daily  . sodium chloride flush  3 mL Intravenous Q12H  . tamsulosin  0.4 mg Oral Daily   Continuous Infusions: . sodium chloride Stopped (11/27/19 2024)  . DAPTOmycin (CUBICIN)  IV 1,000 mg (11/28/19 2019)    Assessment & Plan:   1.  T10-T11 discitis with epidural abscess, MRSA bacteremia. He was previously treated at [facility] from 11/11-12/20 for T10-11 discitis and MRSA bacteremia. He was treated with IV Daptomycin as inpatient and transitioned to [place] at the time of d/c to home. He was discharged with home health.MRI this admission shows evidence of epidural abscess. Neuro surgery, Infectious disease consulted. Now S/P IR guided drainage of the epidural abscess (on 11/26/2019, cx -ve so far).Started back on IV daptomycin 2/6 after discussion  with ID-appreciate their f/u--they recommend central line and transition to OPAT when stable for discharge (when off IV pain meds).  2.  Hematemesis.H&H remained stable.SP upper GI endoscopy by Dr. Benson Norway.No evidence of acute abnormality.Continue PPI patient is back on regular diet.  Avoid NSAIDs.DVT prophylaxis resumed today with heparin  3. Chronic pain syndrome.Patient is on chronic oxycodone 30 mg 3 times daily as well as Zanaflex. Now, additionally, he is on Oxycodone 10 mg every 4 hours PRN and Dilaudid 0.5 mg IV for severe pain--added lidocaine patch--claims does not help--watch for drug seeking behavior.  4.  BPH-Continue Flomax and Proscar  5.  Chronic kidney disease stage IV-Renal function stable.Monitor.Avoid nephrotoxic medications/contrasted studies  6.  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  7.  Essential hypertension-Blood pressure currently stable.Continue home regimen.  8.   Left heel and foot unstageable ulcer.  POA.Dressing changes per wound care.s/p right BKA .Left ABI pending ( ordered 2/4).Currently on IV antibiotics.  9.  History of gout.Continue allopurinol.       DVT prophylaxis: Heparin today Code Status: Full code Family / Patient Communication: d/w patient and answered all questions to satisfaction Disposition Plan: Ongoing need for IV abx/ pain meds. Seen by PT/OT and recommended CIR eval--can transition to OPAT per ID--will need central IV access if going home     LOS: 5 days    Time spent: 25 mins    Guilford Shi, MD [facility] Pager in [place]  If 7PM-7AM, please contact night-coverage [email or web address] Password [email or web address] 11/29/2019, 4:14 PM

## 2019-11-29 NOTE — Progress Notes (Signed)
PHARMACY CONSULT NOTE FOR:  OUTPATIENT  PARENTERAL ANTIBIOTIC THERAPY (OPAT)  Indication: MRSA osteomyelitis Regimen: daptomycin 1000mg  IV q24h  End date: 01/20/2020  IV antibiotic discharge orders are pended. To discharging provider:  please sign these orders via discharge navigator,  Select New Orders & click on the button choice - Manage This Unsigned Work.     Thank you for allowing pharmacy to be a part of this patient's care.  Cristela Felt, PharmD PGY1 Pharmacy Resident [employer]: [phone]  11/29/2019, 11:47 AM

## 2019-11-29 NOTE — Progress Notes (Signed)
[place] for Infectious Disease  Date of Admission:  11/24/2019     Total days of antibiotics 4         ASSESSMENT:  [name] aspiration cultures remain without growth to date and blood cultures have finalized without growth. Continues to show signs of clinical progression. Will plan for 8 weeks of therapy with Daptomycin. Will need central line placed by IR given renal function. Disposition remains pending at present. Will place OPAT orders and will arrange follow up in ID clinic.   PLAN:  1. Continue daptomycin. 2. Monitor CK levels while on Daptomycin.  3. OPAT orders placed.  4. Central line placement per primary team when ready.  5. Arrange follow up in ID office.  Diagnosis: Discitis/Osteomyelitis   Culture Result: No growth (MRSA previous)  Allergies  Allergen Reactions  . Other Other (See Comments)    Blood pressure issues Per [facility] hospital: Pt states he can only take these pain meds or else he gets very sick: Oxycontin,morphine,demerol, and dilaudid are the only pain meds pt states he can take.    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 2.4.2021: He also reports that he currently take Oxycontin.   Marland Kitchen Oxymorphone Other (See Comments)    Causes kidney problems  . Aspirin Nausea And Vomiting    Per [facility]  . Beta Vulgaris Nausea And Vomiting    Beets  . Buspirone Other (See Comments)    unknown  . Cabbage Nausea And Vomiting  . Codeine Other (See Comments)    unknown  . Fish Allergy Nausea And Vomiting  . Fish-Derived Products Nausea And Vomiting  . Pentazocine Other (See Comments)    unknown  . Propoxyphene Other (See Comments)    unknown  . Shellfish Allergy Nausea And Vomiting  . Sulfa Antibiotics Hives  . Sulfasalazine Other (See Comments)  . Amoxicillin Rash    Per [facility] [Acetaminophen] Rash    OPAT Orders Discharge  antibiotics: Daptomycin  Per pharmacy protocol  Duration: 8 weeks   End Date: 01/20/2020  [facility] Care Per Protocol:  Home health RN for IV administration and teaching; PICC line care and labs.    Labs weekly while on IV antibiotics: _X_ CBC with differential __ BMP _X_ CMP _X_ CRP _X_ ESR __ Vancomycin trough _X_ CK  _X_ Please pull PIC at completion of IV antibiotics __ Please leave PIC in place until doctor has seen patient or been notified  Fax weekly labs to [phone]  Clinic Follow Up Appt: 12/27/19 at [place] with Thomas Madeira, NP   Principal Problem:   Upper GI bleeding Active Problems:   [name], type 2) ([place])   Gout   HTN (hypertension)   Discitis   Pressure injury of skin   BPH (benign prostatic hyperplasia)   CKD (chronic kidney disease), stage IV ([facility])   Chronic pain syndrome   Physical deconditioning   Epidural abscess   . allopurinol  100 mg Oral BID  . amLODipine  10 mg Oral Daily  . cloNIDine  0.3 mg Oral TID  . feeding supplement  1 Container Oral BID BM  . finasteride  5 mg Oral Daily  . heparin injection (subcutaneous)  5,000 Units Subcutaneous Q8H  . hydrALAZINE  50 mg Oral Q8H  . insulin aspart  0-15 Units Subcutaneous TID WC  . insulin aspart  0-5 Units  Subcutaneous QHS  . levothyroxine  50 mcg Oral Q0600  . lidocaine  2 patch Transdermal Q24H  . metoprolol tartrate  50 mg Oral BID  . nutrition supplement (JUVEN)  1 packet Oral BID BM  . oxyCODONE  30 mg Oral TID  . pantoprazole  40 mg Oral Daily  . sodium chloride flush  3 mL Intravenous Q12H  . tamsulosin  0.4 mg Oral Daily    SUBJECTIVE:  Afebrile overnight with no acute events. Having some back pain today.   Allergies  Allergen Reactions  . Other Other (See Comments)    Blood pressure issues Per [facility] hospital: Pt states he can only take these pain meds or else he gets very sick: Oxycontin,morphine,demerol, and dilaudid are the only pain meds pt  states he can take.    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 2.4.2021: He also reports that he currently take Oxycontin.   Marland Kitchen Oxymorphone Other (See Comments)    Causes kidney problems  . Aspirin Nausea And Vomiting    Per [facility]  . Beta Vulgaris Nausea And Vomiting    Beets  . Buspirone Other (See Comments)    unknown  . Cabbage Nausea And Vomiting  . Codeine Other (See Comments)    unknown  . Fish Allergy Nausea And Vomiting  . Fish-Derived Products Nausea And Vomiting  . Pentazocine Other (See Comments)    unknown  . Propoxyphene Other (See Comments)    unknown  . Shellfish Allergy Nausea And Vomiting  . Sulfa Antibiotics Hives  . Sulfasalazine Other (See Comments)  . Amoxicillin Rash    Per [facility]  . Apap [Acetaminophen] Rash     Review of Systems: Review of Systems  Constitutional: Negative for chills, fever and weight loss.  Respiratory: Negative for cough, shortness of breath and wheezing.   Cardiovascular: Negative for chest pain and leg swelling.  Gastrointestinal: Negative for abdominal pain, constipation, diarrhea, nausea and vomiting.  Musculoskeletal: Positive for back pain.  Skin: Negative for rash.      OBJECTIVE: Vitals:   11/28/19 2100 11/29/19 0536 11/29/19 0855 11/29/19 0900  BP: (!) 151/89 (!) 175/88 (!) 172/88   Pulse: 78 74  89  Resp: 18 20    Temp: 98.3 F (36.8 C) 98.8 F (37.1 C)    TempSrc: Oral Oral    SpO2: 100% 97%    Weight:      Height:       Body mass index is 28.44 kg/m.  Physical Exam Constitutional:      General: He is not in acute distress.    Appearance: He is well-developed.     Comments: Lying in bed with head of bed; pleasant.   Cardiovascular:     Rate and Rhythm: Normal rate and regular rhythm.     Heart sounds: Normal heart sounds.  Pulmonary:     Effort: Pulmonary effort is normal.     Breath sounds: Normal  breath sounds.  Skin:    General: Skin is warm and dry.  Neurological:     Mental Status: He is alert and oriented to person, place, and time.  Psychiatric:        Mood and Affect: Mood normal.     Lab Results Lab Results  Component Value Date   WBC 9.2 11/29/2019   HGB 8.9 (L) 11/29/2019   HCT 28.0 (L) 11/29/2019   MCV 98.9 11/29/2019  PLT 177 11/29/2019    Lab Results  Component Value Date   CREATININE 2.26 (H) 11/28/2019   BUN 28 (H) 11/28/2019   NA 141 11/28/2019   K 3.7 11/28/2019   CL 107 11/28/2019   CO2 25 11/28/2019    Lab Results  Component Value Date   ALT 15 11/26/2019   AST 29 11/26/2019   ALKPHOS 55 11/26/2019   BILITOT 0.5 11/26/2019     Microbiology: Recent Results (from the past 240 hour(s))  Blood Cultures x 2 sites     Status: None   Collection Time: 11/24/19 10:50 AM   Specimen: BLOOD LEFT HAND  Result Value Ref Range Status   Specimen Description BLOOD LEFT HAND  Final   Special Requests   Final    BOTTLES DRAWN AEROBIC AND ANAEROBIC Blood Culture results may not be optimal due to an inadequate volume of blood received in culture bottles   Culture   Final    NO GROWTH 5 DAYS Performed at [facility], [place] [address] [place], [place] [address]    Report Status 11/29/2019 FINAL  Final  Blood Cultures x 2 sites     Status: None   Collection Time: 11/24/19 10:55 AM   Specimen: BLOOD RIGHT HAND  Result Value Ref Range Status   Specimen Description BLOOD RIGHT HAND  Final   Special Requests   Final    BOTTLES DRAWN AEROBIC AND ANAEROBIC Blood Culture adequate volume   Culture   Final    NO GROWTH 5 DAYS Performed at [facility], [place] [address], [place], [place] [address]    Report Status 11/29/2019 FINAL  Final  MRSA PCR Screening     Status: None   Collection Time: 11/24/19  2:59 PM   Specimen: Nasopharyngeal  Result Value Ref Range Status   MRSA by PCR NEGATIVE NEGATIVE Final    Comment:        The GeneXpert MRSA  Assay (FDA approved for NASAL specimens only), is one component of a comprehensive MRSA colonization surveillance program. It is not intended to diagnose MRSA infection nor to guide or monitor treatment for MRSA infections. Performed at [facility], [place] [address], [place], [place] [address]   Aerobic/Anaerobic Culture (surgical/deep wound)     Status: None (Preliminary result)   Collection Time: 11/26/19  2:41 PM   Specimen: NEEDLE ASPIRATE  Result Value Ref Range Status   Specimen Description NEEDLE ASPIRATE DISC  Final   Special Requests NONE  Final   Gram Stain NO WBC SEEN NO ORGANISMS SEEN   Final   Culture   Final    NO GROWTH 2 DAYS Performed at [facility], [place] [address] [place], [place] [address]    Report Status PENDING  Incomplete     Terri Piedra, [place] for [place] Pager  11/29/2019  11:05 AM

## 2019-11-29 NOTE — Progress Notes (Signed)
Pharmacy Antibiotic Note  Thomas [name] is a 69 y.o. 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 Was last on daptomycin 08/31/19 through 10/09/19 then discharged on dalbavancin weekly until around 10/27/19. Pharmacy has been consulted for daptomycin dosing. MRI this admission found progressive osteomyelitis and persistent cellulitis and abscess at T10-11 with probable epidural abscess. Patient underwent IR disc aspiration on 2/6 and culture is no growth to date. Patient was started on daptomycin 1000mg  IV q24h. CK 38. WBC wnl. Afebrile.   Plan: Continue daptomycin 1000mg  IV q24h  Monitor CK every Monday Monitor cultures/sensitivities, renal function, and clinical progression   Height: 6\' 6"  (198.1 cm) Weight: 246 lb 1 oz (111.6 kg) IBW/kg (Calculated) : 91.4  Temp (24hrs), Avg:98.9 F (37.2 C), Min:98.3 F (36.8 C), Max:99.4 F (37.4 C)  Recent Labs  Lab 11/24/19 0614 11/24/19 0614 11/24/19 1100 11/25/19 0420 11/26/19 0754 11/28/19 0505 11/29/19 0557  WBC 10.1   < > 8.5 5.7 3.8* 4.8 9.2  CREATININE 2.73*  --   --  2.83* 2.67* 2.26*  --    < > = values in this interval not displayed.    Estimated Creatinine Clearance: 45.2 mL/min (A) (by C-G formula based on SCr of 2.26 mg/dL (H)).     Antimicrobials this admission: Daptomycin 2/6 >>    Microbiology results: 2/4 BCx: ngt 2/4 MRSA PCR: neg 2/6 epidural abscess: ngtd    Cristela Felt, PharmD PGY1 Pharmacy Resident [employer]: [phone]

## 2019-11-29 NOTE — Plan of Care (Signed)
  Problem: Education: Goal: Knowledge of General Education information will improve Description Including pain rating scale, medication(s)/side effects and non-pharmacologic comfort measures Outcome: Progressing   

## 2019-11-29 NOTE — Progress Notes (Signed)
Orthopedic Tech Progress Note Patient Details:  DEAIRE CLENNON 12-Oct-1951 [id number] Had RN remove old boot so I could apply a new UNNA BOOT to leg Ortho Devices Type of Ortho Device: Haematologist Ortho Device/Splint Location: LLE Ortho Device/Splint Interventions: Application, Ordered   Post Interventions Patient Tolerated: Well Instructions Provided: Care of device, Adjustment of device   Janit Pagan 11/29/2019, 12:49 PM

## 2019-11-30 ENCOUNTER — Inpatient Hospital Stay (HOSPITAL_COMMUNITY): Payer: Medicare Other

## 2019-11-30 DIAGNOSIS — L0390 Cellulitis, unspecified: Secondary | ICD-10-CM

## 2019-11-30 LAB — COMPREHENSIVE METABOLIC PANEL
ALT: 13 U/L (ref 0–44)
AST: 22 U/L (ref 15–41)
Albumin: 2.8 g/dL — ABNORMAL LOW (ref 3.5–5.0)
Alkaline Phosphatase: 80 U/L (ref 38–126)
Anion gap: 9 (ref 5–15)
BUN: 25 mg/dL — ABNORMAL HIGH (ref 8–23)
CO2: 27 mmol/L (ref 22–32)
Calcium: 8.7 mg/dL — ABNORMAL LOW (ref 8.9–10.3)
Chloride: 102 mmol/L (ref 98–111)
Creatinine, Ser: 2.4 mg/dL — ABNORMAL HIGH (ref 0.61–1.24)
GFR calc Af Amer: 31 mL/min — ABNORMAL LOW (ref >60)
GFR calc non Af Amer: 27 mL/min — ABNORMAL LOW (ref >60)
Glucose, Bld: 167 mg/dL — ABNORMAL HIGH (ref 70–99)
Potassium: 4.1 mmol/L (ref 3.5–5.1)
Sodium: 138 mmol/L (ref 135–145)
Total Bilirubin: 0.4 mg/dL (ref 0.3–1.2)
Total Protein: 6.9 g/dL (ref 6.5–8.1)

## 2019-11-30 LAB — GLUCOSE, CAPILLARY
Glucose-Capillary: 148 mg/dL — ABNORMAL HIGH (ref 70–99)
Glucose-Capillary: 217 mg/dL — ABNORMAL HIGH (ref 70–99)
Glucose-Capillary: 169 mg/dL — ABNORMAL HIGH (ref 70–99)
Glucose-Capillary: 145 mg/dL — ABNORMAL HIGH (ref 70–99)

## 2019-11-30 LAB — CBC
HCT: 28.2 % — ABNORMAL LOW (ref 39.0–52.0)
Hemoglobin: 9.1 g/dL — ABNORMAL LOW (ref 13.0–17.0)
MCH: 31.4 pg (ref 26.0–34.0)
MCHC: 32.3 g/dL (ref 30.0–36.0)
MCV: 97.2 fL (ref 80.0–100.0)
Platelets: 182 10*3/uL (ref 150–400)
RBC: 2.9 MIL/uL — ABNORMAL LOW (ref 4.22–5.81)
RDW: 14.1 % (ref 11.5–15.5)
WBC: 6.7 10*3/uL (ref 4.0–10.5)
nRBC: 0 % (ref 0.0–0.2)

## 2019-11-30 LAB — PHOSPHORUS: Phosphorus: 4.6 mg/dL (ref 2.5–4.6)

## 2019-11-30 LAB — MAGNESIUM: Magnesium: 1.7 mg/dL (ref 1.7–2.4)

## 2019-11-30 MED ORDER — BISACODYL 10 MG RE SUPP
10.0000 mg | Freq: Every day | RECTAL | Status: DC | PRN
  Administered 2019-11-30 – 2019-12-14 (×5): 10 mg via RECTAL
  Filled 2019-11-30 (×7): qty 1

## 2019-11-30 NOTE — Plan of Care (Signed)
  Problem: Education: Goal: Knowledge of General Education information will improve Description: Including pain rating scale, medication(s)/side effects and non-pharmacologic comfort measures Outcome: Progressing   Problem: Clinical Measurements: Goal: Ability to maintain clinical measurements within normal limits will improve Outcome: Progressing   

## 2019-11-30 NOTE — Progress Notes (Signed)
PROGRESS NOTE    BECKER FERRING  [id number] DOB: 1951-02-20 DOA: 11/24/2019 PCP: Antonietta Jewel, MD   Brief Narrative:  The patient is a 69 y.o.malewith h/oCVA, HTN, hyperlipidemia,Gout,DM, CBP,PAD,s/pright BKA, chronic anemia, who was admitted on 1/31 to [facility] a fall with inability to get up on his own- prolonged down time. He was found to have a LLE wound/cellulitis with concern for outpatient treatment failure with Clindamycin. He was treated with IV Dapto with plan to transition to Linezolid at d/c (due to h/o MRSA bacteremia/diskitis in Nov 2020). On the evening of 2/3, he developed abdominal pain with n/v with coffee ground emesis that was gastroccult positive and so he was transferred to [facility] for GI evaluation. Patient also c/o severe back pain. Found to have epidural abscess likely from his previous MRSA infection.Underwent IR guided drainage.Currently plan isIV daptomycin prolonged abx treatment.   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 from 11/11-12/20 for T10-11 discitis and MRSA bacteremia.  -He was treated with IV Daptomycin as inpatient and transitioned to [place] at the time of d/c to home.  -He was discharged with home health. -MRI this admission shows evidence of epidural abscess. Neuro surgery, Infectious disease consulted.  -Now S/PIR guided drainage of the epidural abscess (on 11/26/2019, cx -ve so far). -Started back on IV daptomycin 2/6 after discussion with ID and will continue and CK was 38 and CRP was 8.3; infectious diseases recommends 8 weeks of IV  daptomycin and have the patient arrange for HSFU -Appreciate their f/u--they recommend central line and transition to OPAT when stable for discharge (when off IV pain meds) and Discharge Disposition secure (CIR vs. SNF) -OOB to Chair if able   Hematemesis -H&H remained stable and today it is 9.1/28.2 -SP upper GI endoscopy by Dr. Benson Norway. -No evidence of acute abnormality. -Continue PPI patient is back on regular diet. Avoid NSAIDs. -DVT prophylaxis resumed today with heparin  Chronic pain syndrome -Patient is on chronic oxycodone 30 mg 3 times daily as well as Tizanidine 2 m po q8h -Now, additionally, he is on Oxycodone 10 mg every 4 hours PRN and Dilaudid 1 mg IV  q6hfor severe pain -Added lidocaine patch--claims does not help -Watch for drug seeking behavior and Judicious Use of IV Narcotics .  BPH -Continue Tamsulosin 0.4 mg po Daily and Finasteride 5 mg po Daily   Chronic kidney disease stage IV -Renal function stable as BUN/Cr was 25/2.40 -Avoid nephrotoxic medications, contrast dyes, hypotension and renally adjust medications -Patient's BUN/creatinine had improved from admission down to 28/2.26 and is now as listed as above -Continue monitor and trend renal function -Repeat CMP in a.m.  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 -Continue home regimen with Amlodipine 10 mg po Daily, Clonidine 0.3 mg TID, Finasteride 5 mg po Daily, Hydralazine 50 mg po q8h, and Metoprolol Tartrate 50 mg po BID  -C/w Labetalol  10 mg q2hprn For SBP>160  Left heel and foot unstageable ulcer. -POA.Dressing changes per wound care -S/p right BKA -Left ABI pending (ordered 2/4) and done this AM -Currently on IV antibiotics with  Daptomycin.  History of Gout -Continue Allopurinol 100 mg po BID   Hypothyroidism -C/w Levothyroxine 50 mcg po Daily    DVT prophylaxis: Heparin 5,000 units sq q8h  Code Status: FULL CODE Family Communication: No family present at bedside  Disposition Plan: CIR vs. SNF patient is able to be weaned off of IV narcotics.  PT OT recommending CIR but currently not a candidate at this time as he is not meeting the needs per CIR coordinator.  Will need central access for long-term antibiotic usage for 8 weeks  Consultants:   Gastroenterology  Infectious Diseases    Procedures: None   Antimicrobials:  Anti-infectives (From admission, onward)   Start     Dose/Rate Route Frequency Ordered Stop   11/26/19 1730  DAPTOmycin (CUBICIN) 1,000 mg in sodium chloride 0.9 % IVPB     1,000 mg 240 mL/hr over 30 Minutes Intravenous Every 24 hours 11/26/19 1642       Subjective: Seen and examined at bedside and the patient was still complaining of significant amount of pain from his mid sternum all the way down to his pubic bone.  Complained of some abdominal pain as well.  Denied any chest pain, lightheadedness or dizziness.  States that he fell on his hospital bed at home and lay in this position for most 3 hours causing him to jar his back further.  No other concerns or complaints at this time and his main complaint is his pain.  Objective: Vitals:   11/29/19 0855 11/29/19 0900 11/29/19 1414 11/29/19 2155  BP: (!) 172/88  (!) 160/85 (!) 162/84  Pulse:  89  81  Resp:    18  Temp:    98.5 F (36.9 C)  TempSrc:    Oral  SpO2:    95%  Weight:      Height:        Intake/Output Summary (Last 24 hours) at 11/30/2019 0758 Last data filed at 11/30/2019 0700 Gross per 24 hour  Intake 797 ml  Output 900 ml  Net -103 ml   Filed Weights   11/25/19 1035 11/26/19 2330 11/27/19 2048  Weight: 109 kg 111.6 kg 111.6 kg   Examination: Physical Exam:  Constitutional: WN/WD overweight older  appearing Caucasian male currently in NAD and appears calm but does appear uncomfortable complaining of pain Eyes: Lids and conjunctivae normal, sclerae anicteric  ENMT: External Ears, Nose appear normal. Grossly normal hearing. Neck: Appears normal, supple, no cervical masses, normal ROM, no appreciable thyromegaly; no JVD Respiratory: Diminished to auscultation bilaterally, no wheezing, rales, rhonchi or crackles. Normal respiratory effort and patient is not tachypenic. No accessory muscle use.  Cardiovascular: RRR, no murmurs / rubs / gallops. S1 and S2 auscultated.  Left leg is wrapped Abdomen: Soft, tender to palpate, distended secondary body habitus. Bowel sounds positive x4.  GU: Deferred. Musculoskeletal: No clubbing / cyanosis of digits/nails.  The right BKA Skin: No rashes, lesions on a limited skin evaluation but left leg is wrapped and I did not view his foot ulcers. No induration; Warm and dry.  Neurologic: CN 2-12 grossly intact with no focal deficits. Romberg sign and cerebellar reflexes not assessed.  Psychiatric: Normal judgment and insight. Alert and oriented x 3. Anxious mood and appropriate affect.   Data Reviewed: I have personally  reviewed following labs and imaging studies  CBC: Recent Labs  Lab 11/24/19 0614 11/24/19 0614 11/24/19 1100 11/24/19 1100 11/25/19 0420 11/26/19 0754 11/28/19 0505 11/29/19 0557 11/30/19 0539  WBC 10.1   < > 8.5   < > 5.7 3.8* 4.8 9.2 6.7  NEUTROABS 9.2*  --  7.4  --   --  3.0  --   --   --   HGB 10.8*   < > 10.9*   < > 9.5* 8.9* 9.0* 8.9* 9.1*  HCT 34.8*   < > 35.1*   < > 30.7* 28.7* 28.4* 28.0* 28.2*  MCV 100.9*   < > 100.0   < > 100.7* 98.6 99.0 98.9 97.2  PLT 195   < > 202   < > 160 125* 164 177 182   < > = values in this interval not displayed.   Basic Metabolic Panel: Recent Labs  Lab 11/24/19 0614 11/25/19 0420 11/26/19 0754 11/28/19 0505  NA 141 143 141 141  K 4.1 4.3 3.7 3.7  CL 102 105 104 107  CO2 24 28 26 25    GLUCOSE 198* 141* 152* 157*  BUN 42* 44* 39* 28*  CREATININE 2.73* 2.83* 2.67* 2.26*  CALCIUM 9.1 8.8* 8.3* 8.5*  MG  --   --   --  1.7   GFR: Estimated Creatinine Clearance: 45.2 mL/min (A) (by C-G formula based on SCr of 2.26 mg/dL (H)). Liver Function Tests: Recent Labs  Lab 11/24/19 0614 11/26/19 0754  AST 27 29  ALT 13 15  ALKPHOS 75 55  BILITOT 0.7 0.5  PROT 8.1 6.6  ALBUMIN 3.2* 2.6*   No results for input(s): LIPASE, AMYLASE in the last 168 hours. No results for input(s): AMMONIA in the last 168 hours. Coagulation Profile: Recent Labs  Lab 11/24/19 0614 11/26/19 0754  INR 1.0 1.2   Cardiac Enzymes: Recent Labs  Lab 11/28/19 0505  CKTOTAL 38*   BNP (last 3 results) No results for input(s): PROBNP in the last 8760 hours. HbA1C: No results for input(s): HGBA1C in the last 72 hours. CBG: Recent Labs  Lab 11/29/19 0701 11/29/19 1130 11/29/19 1557 11/29/19 2157 11/30/19 0633  GLUCAP 211* 124* 136* 159* 148*   Lipid Profile: No results for input(s): CHOL, HDL, LDLCALC, TRIG, CHOLHDL, LDLDIRECT in the last 72 hours. Thyroid Function Tests: No results for input(s): TSH, T4TOTAL, FREET4, T3FREE, THYROIDAB in the last 72 hours. Anemia Panel: No results for input(s): VITAMINB12, FOLATE, FERRITIN, TIBC, IRON, RETICCTPCT in the last 72 hours. Sepsis Labs: No results for input(s): PROCALCITON, LATICACIDVEN in the last 168 hours.  Recent Results (from the past 240 hour(s))  Blood Cultures x 2 sites     Status: None   Collection Time: 11/24/19 10:50 AM   Specimen: BLOOD LEFT HAND  Result Value Ref Range Status   Specimen Description BLOOD LEFT HAND  Final   Special Requests   Final    BOTTLES DRAWN AEROBIC AND ANAEROBIC Blood Culture results may not be optimal due to an inadequate volume of blood received in culture bottles   Culture   Final    NO GROWTH 5 DAYS Performed at [facility], [place] [address], [place], [place] [address]    Report Status  11/29/2019 FINAL  Final  Blood Cultures x 2 sites     Status: None   Collection Time: 11/24/19 10:55 AM   Specimen: BLOOD RIGHT HAND  Result Value Ref Range Status   Specimen Description BLOOD RIGHT  HAND  Final   Special Requests   Final    BOTTLES DRAWN AEROBIC AND ANAEROBIC Blood Culture adequate volume   Culture   Final    NO GROWTH 5 DAYS Performed at [facility], [address], [place], [place] [address]    Report Status 11/29/2019 FINAL  Final  MRSA PCR Screening     Status: None   Collection Time: 11/24/19  2:59 PM   Specimen: Nasopharyngeal  Result Value Ref Range Status   MRSA by PCR NEGATIVE NEGATIVE Final    Comment:        The GeneXpert MRSA Assay (FDA approved for NASAL specimens only), is one component of a comprehensive MRSA colonization surveillance program. It is not intended to diagnose MRSA infection nor to guide or monitor treatment for MRSA infections. Performed at [facility], [place] [address], [place], [place] [address]   Aerobic/Anaerobic Culture (surgical/deep wound)     Status: None (Preliminary result)   Collection Time: 11/26/19  2:41 PM   Specimen: NEEDLE ASPIRATE  Result Value Ref Range Status   Specimen Description NEEDLE ASPIRATE DISC  Final   Special Requests NONE  Final   Gram Stain NO WBC SEEN NO ORGANISMS SEEN   Final   Culture   Final    NO GROWTH 3 DAYS Performed at [facility], [place] [address], [place],  [address]    Report Status PENDING  Incomplete     RN Pressure Injury Documentation: Pressure Injury 11/24/19 Heel Left Unstageable - Full thickness tissue loss in which the base of the injury is covered by slough (yellow, tan, gray, green or brown) and/or eschar (tan, brown or black) in the wound bed. (Active)  11/24/19 0655  Location: Heel  Location Orientation: Left  Staging: Unstageable - Full thickness tissue loss in which the base of the injury is covered by slough (yellow, tan, gray, green or  brown) and/or eschar (tan, brown or black) in the wound bed.  Wound Description (Comments):   Present on Admission: Yes     Pressure Injury 09/02/19 Foot Left Unstageable - Full thickness tissue loss in which the base of the ulcer is covered by slough (yellow, tan, gray, green or brown) and/or eschar (tan, brown or black) in the wound bed. (Active)  09/02/19 0815  Location: Foot  Location Orientation: Left  Staging: Unstageable - Full thickness tissue loss in which the base of the ulcer is covered by slough (yellow, tan, gray, green or brown) and/or eschar (tan, brown or black) in the wound bed.  Wound Description (Comments):   Present on Admission: Yes     Estimated body mass index is 28.44 kg/m as calculated from the following:   Height as of this encounter: 6\' 6"  (1.981 m).   Weight as of this encounter: 111.6 kg.  Malnutrition Type:  Nutrition Problem: Increased nutrient needs Etiology: wound healing   Malnutrition Characteristics:  Signs/Symptoms: estimated needs   Nutrition Interventions:  Interventions: Boost Breeze, Juven   Radiology Studies: No results found.  Scheduled Meds: . allopurinol  100 mg Oral BID  . amLODipine  10 mg Oral Daily  . cloNIDine  0.3 mg Oral TID  . feeding supplement  1 Container Oral BID BM  . finasteride  5 mg Oral Daily  . heparin injection (subcutaneous)  5,000 Units Subcutaneous Q8H  . hydrALAZINE  50 mg Oral Q8H  . insulin aspart  0-15 Units Subcutaneous TID WC  . insulin aspart  0-5 Units Subcutaneous QHS  . levothyroxine  50 mcg Oral Q0600  . lidocaine  2 patch Transdermal Q24H  . metoprolol tartrate  50 mg Oral BID  . nutrition supplement (JUVEN)  1 packet Oral BID BM  . oxyCODONE  30 mg Oral TID  . pantoprazole  40 mg Oral Daily  . sodium chloride flush  3 mL Intravenous Q12H  . tamsulosin  0.4 mg Oral Daily   Continuous Infusions: . sodium chloride Stopped (11/27/19 2024)  . DAPTOmycin (CUBICIN)  IV Stopped (11/29/19  2337)    LOS: 6 days   Kerney Elbe, DO [facility] PAGER is on [place]  If 7PM-7AM, please contact night-coverage [email or web address]

## 2019-11-30 NOTE — Progress Notes (Signed)
Rehab Admissions Coordinator Note:  Per PT and OT recommendation, this patient was screened by Raechel Ache for appropriateness for an Inpatient Acute Rehab Consult.    At this time, pt has not shown an ability to tolerate an intensive therapy program as evaluations were limited to bed level. At this time, would not recommend CIR. If pt shows improved tolerance, please contact AC to re-screen.    Raechel Ache 11/30/2019, 2:37 PM  I can be reached at [phone].

## 2019-11-30 NOTE — Progress Notes (Signed)
Physical Therapy Treatment Patient Details Name: Thomas [name] MRN: [id number] DOB: 1951/04/27 Today's Date: 11/30/2019    History of Present Illness Patient is a 69 year old male history of diabetes mellitus type 2, hypertension, chronic kidney disease stage III, anemia, R BKA Sept 2019, who was admitted at [facility] after having fever chills and back pain. Patient transferred from [facility] for MRSA bacteremia. CT abdomen showing discitis involvling T10, T11 area.    PT Comments    Pt was seen for mobility and noted his reluctance to assist to sit up on side of bed.  Pt was cued by PT and OT but refused to let either one assist him physically. Pt is not able to push up from his side and every time he got there, made a quick effort to push and returned to supine.  Follow up with him to get him into a chair as possible, and note CIR has declined his admission for now based on limited mobility he is performing.  Plan is to continue to get him into posture to sit up but have approached pt with plan to use a lift if needed, and he refused this for now.   Follow Up Recommendations  CIR     Equipment Recommendations  None recommended by PT    Recommendations for Other Services       Precautions / Restrictions Precautions Precautions: Back;Fall Precaution Booklet Issued: No Precaution Comments: has full lumbar fusion Restrictions Weight Bearing Restrictions: No    Mobility  Bed Mobility Overal bed mobility: Needs Assistance Bed Mobility: Rolling;Sidelying to Sit Rolling: Supervision Sidelying to sit: Mod assist       General bed mobility comments: refused to fully sit up on side of bed and would not let PT help lift his trunk to sit up once in a posture with legs at side of bed  Transfers Overall transfer level: Needs assistance               General transfer comment: deferred to let PT and OT try  Ambulation/Gait                 Stairs              Wheelchair Mobility    Modified Rankin (Stroke Patients Only)       Balance                                            Cognition Arousal/Alertness: Awake/alert Behavior During Therapy: Anxious Overall Cognitive Status: Within Functional Limits for tasks assessed                                        Exercises General Exercises - Lower Extremity Hip Flexion/Marching: AAROM;10 reps    General Comments General comments (skin integrity, edema, etc.): Pt has some significant skin and toe changes on LLE.  Has edema on R stump which looks too significant to don a leg, not wearing a shrinker      Pertinent Vitals/Pain Pain Assessment: Faces Faces Pain Scale: Hurts even more Pain Location: lumbar spine with any back movement Pain Descriptors / Indicators: Grimacing;Guarding Pain Intervention(s): Premedicated before session;Repositioned;RN gave pain meds during session;Limited activity within patient's tolerance;Monitored during session    Home Living  Prior Function            PT Goals (current goals can now be found in the care plan section) Acute Rehab PT Goals Patient Stated Goal: get home Progress towards PT goals: Progressing toward goals    Frequency    Min 3X/week      PT Plan Current plan remains appropriate    Co-evaluation              AM-PAC PT "6 Clicks" Mobility   Outcome Measure  Help needed turning from your back to your side while in a flat bed without using bedrails?: None Help needed moving from lying on your back to sitting on the side of a flat bed without using bedrails?: A Little Help needed moving to and from a bed to a chair (including a wheelchair)?: A Little Help needed standing up from a chair using your arms (e.g., wheelchair or bedside chair)?: A Lot Help needed to walk in hospital room?: A Lot Help needed climbing 3-5 steps with a railing? :  Total 6 Click Score: 15    End of Session   Activity Tolerance: Patient limited by pain Patient left: in bed;with call bell/phone within reach;with bed alarm set;with nursing/sitter in room Nurse Communication: Mobility status;Patient requests pain meds PT Visit Diagnosis: History of falling (Z91.81);Pain Pain - Right/Left: Left Pain - part of body: (spine and flexion of L hip)     Time: [id number] PT Time Calculation (min) (ACUTE ONLY): 39 min  Charges:  $Therapeutic Exercise: 23-37 mins                    Ramond Dial 11/30/2019, 2:58 PM   Mee Hives, PT MS Acute Rehab Dept. Number: [place] and [place]

## 2019-11-30 NOTE — Plan of Care (Signed)
  Problem: Health Behavior/Discharge Planning: Goal: Ability to manage health-related needs will improve Outcome: Progressing   

## 2019-11-30 NOTE — Progress Notes (Signed)
ABI has been completed.   Preliminary results in CV Proc.   Abram Sander 11/30/2019 2:59 PM

## 2019-11-30 NOTE — Progress Notes (Signed)
Occupational Therapy Treatment Patient Details Name: Thomas [name] MRN: [id number] DOB: [birth year] Today's Date: 11/30/2019    History of present illness Patient is a 69 year old male history of diabetes mellitus type 2, hypertension, chronic kidney disease stage III, anemia, R BKA Sept 2019, who was admitted at [facility] after having fever chills and back pain. Patient transferred from [facility] for MRSA bacteremia. CT abdomen showing discitis involvling T10, T11 area.   OT comments  Patient supine in bed and seen with PT to progress mobility.  Patient remains limited by pain.  Reviewed techniques to decrease pain and pt premedicated, but patient resistant to movement during session. Able to roll with supervision, transition to sidelying and partial lift to EOB but once approx 45* trunk elevation he returns to supine and declines to let therapists assist.  Patient anxious with mobility. Declined ADL performance, but anticipate can complete supine grooming/UB ADLs with setup assist.  Will continue to follow, but will need post acute rehab (CIR vs SNF) pending progression.    Follow Up Recommendations  CIR    Equipment Recommendations  Other (comment)(TBD at next venue of care )    Recommendations for Other Services      Precautions / Restrictions Precautions Precautions: Back;Fall Precaution Booklet Issued: No Precaution Comments: has full lumbar fusion Restrictions Weight Bearing Restrictions: No       Mobility Bed Mobility Overal bed mobility: Needs Assistance Bed Mobility: Rolling;Sidelying to Sit Rolling: Supervision Sidelying to sit: Mod assist       General bed mobility comments: mutiple trials to transition from rolling to sidelying (on L and R), transitions partially and then lays back down due to pain; declines to have therapist assist trunk   Transfers Overall transfer level: Needs assistance               General transfer comment: deferred  to let PT and OT try    Balance                                           ADL either performed or assessed with clinical judgement   ADL Overall ADL's : Needs assistance/impaired     Grooming: Set up;Bed level                     Toilet Transfer Details (indicate cue type and reason): unable due to pain          Functional mobility during ADLs: Supervision/safety(rolling in bed) General ADL Comments: limited due to pain      Vision       Perception     Praxis      Cognition Arousal/Alertness: Awake/alert Behavior During Therapy: Anxious Overall Cognitive Status: Within Functional Limits for tasks assessed                                          Exercises Exercises: General Lower Extremity General Exercises - Lower Extremity Hip Flexion/Marching: AAROM;10 reps   Shoulder Instructions       General Comments thorough education on importance of mobility, techniques for pain control; but patinet unable to over come pain to attempt to sit EOB     Pertinent Vitals/ Pain       Pain Assessment: Faces Faces Pain  Scale: Hurts even more Pain Location: lumbar spine with any back movement Pain Descriptors / Indicators: Grimacing;Guarding Pain Intervention(s): Premedicated before session;Repositioned;Monitored during session;Limited activity within patient's tolerance  Home Living                                          Prior Functioning/Environment              Frequency  Min 2X/week        Progress Toward Goals  OT Goals(current goals can now be found in the care plan section)  Progress towards OT goals: Not progressing toward goals - comment(pain )  Acute Rehab OT Goals Patient Stated Goal: get home OT Goal Formulation: With patient  Plan Discharge plan remains appropriate;Frequency remains appropriate    Co-evaluation                 AM-PAC OT "6 Clicks" Daily Activity      Outcome Measure   Help from another person eating meals?: None Help from another person taking care of personal grooming?: A Little Help from another person toileting, which includes using toliet, bedpan, or urinal?: Total Help from another person bathing (including washing, rinsing, drying)?: A Lot Help from another person to put on and taking off regular upper body clothing?: A Little Help from another person to put on and taking off regular lower body clothing?: Total 6 Click Score: 14    End of Session    OT Visit Diagnosis: Other abnormalities of gait and mobility (R26.89);Pain Pain - part of body: (back, abdomen)   Activity Tolerance Patient limited by pain   Patient Left in bed;with call bell/phone within reach;with bed alarm set   Nurse Communication Mobility status        Time: [id number] OT Time Calculation (min): 32 min  Charges: OT General Charges $OT Visit: 1 Visit OT Treatments $Self Care/Home Management : 8-22 mins  Jolaine Artist, OT [place] Pager [phone] Office [phone]     Delight Stare 11/30/2019, 3:17 PM

## 2019-12-01 LAB — GLUCOSE, CAPILLARY
Glucose-Capillary: 143 mg/dL — ABNORMAL HIGH (ref 70–99)
Glucose-Capillary: 180 mg/dL — ABNORMAL HIGH (ref 70–99)
Glucose-Capillary: 138 mg/dL — ABNORMAL HIGH (ref 70–99)
Glucose-Capillary: 138 mg/dL — ABNORMAL HIGH (ref 70–99)

## 2019-12-01 LAB — CBC WITH DIFFERENTIAL/PLATELET
Abs Immature Granulocytes: 0.05 10*3/uL (ref 0.00–0.07)
Basophils Absolute: 0 10*3/uL (ref 0.0–0.1)
Basophils Relative: 0 %
Eosinophils Absolute: 0.4 10*3/uL (ref 0.0–0.5)
Eosinophils Relative: 5 %
HCT: 27.5 % — ABNORMAL LOW (ref 39.0–52.0)
Hemoglobin: 8.9 g/dL — ABNORMAL LOW (ref 13.0–17.0)
Immature Granulocytes: 1 %
Lymphocytes Relative: 22 %
Lymphs Abs: 1.7 10*3/uL (ref 0.7–4.0)
MCH: 31.1 pg (ref 26.0–34.0)
MCHC: 32.4 g/dL (ref 30.0–36.0)
MCV: 96.2 fL (ref 80.0–100.0)
Monocytes Absolute: 0.5 10*3/uL (ref 0.1–1.0)
Monocytes Relative: 7 %
Neutro Abs: 5.1 10*3/uL (ref 1.7–7.7)
Neutrophils Relative %: 65 %
Platelets: 192 10*3/uL (ref 150–400)
RBC: 2.86 MIL/uL — ABNORMAL LOW (ref 4.22–5.81)
RDW: 14 % (ref 11.5–15.5)
WBC: 7.7 10*3/uL (ref 4.0–10.5)
nRBC: 0 % (ref 0.0–0.2)

## 2019-12-01 LAB — COMPREHENSIVE METABOLIC PANEL
ALT: 13 U/L (ref 0–44)
AST: 23 U/L (ref 15–41)
Albumin: 2.7 g/dL — ABNORMAL LOW (ref 3.5–5.0)
Alkaline Phosphatase: 73 U/L (ref 38–126)
Anion gap: 15 (ref 5–15)
BUN: 30 mg/dL — ABNORMAL HIGH (ref 8–23)
CO2: 24 mmol/L (ref 22–32)
Calcium: 8.9 mg/dL (ref 8.9–10.3)
Chloride: 100 mmol/L (ref 98–111)
Creatinine, Ser: 2.71 mg/dL — ABNORMAL HIGH (ref 0.61–1.24)
GFR calc Af Amer: 27 mL/min — ABNORMAL LOW (ref >60)
GFR calc non Af Amer: 23 mL/min — ABNORMAL LOW (ref >60)
Glucose, Bld: 164 mg/dL — ABNORMAL HIGH (ref 70–99)
Potassium: 4.1 mmol/L (ref 3.5–5.1)
Sodium: 139 mmol/L (ref 135–145)
Total Bilirubin: 0.2 mg/dL — ABNORMAL LOW (ref 0.3–1.2)
Total Protein: 6.7 g/dL (ref 6.5–8.1)

## 2019-12-01 LAB — AEROBIC/ANAEROBIC CULTURE W GRAM STAIN (SURGICAL/DEEP WOUND)
Culture: NO GROWTH
Gram Stain: NONE SEEN

## 2019-12-01 LAB — MAGNESIUM: Magnesium: 1.7 mg/dL (ref 1.7–2.4)

## 2019-12-01 LAB — PHOSPHORUS: Phosphorus: 4.8 mg/dL — ABNORMAL HIGH (ref 2.5–4.6)

## 2019-12-01 MED ORDER — ENSURE ENLIVE PO LIQD
237.0000 mL | Freq: Two times a day (BID) | ORAL | Status: DC
  Administered 2019-12-01 – 2019-12-16 (×25): 237 mL via ORAL

## 2019-12-01 NOTE — Plan of Care (Signed)
  Problem: Nutrition: Goal: Adequate nutrition will be maintained Outcome: Progressing   Problem: Coping: Goal: Level of anxiety will decrease Outcome: Progressing   

## 2019-12-01 NOTE — Progress Notes (Signed)
PROGRESS NOTE    Thomas [name]  [id number] DOB: 27-Jul-1951 DOA: 11/24/2019 PCP: Antonietta Jewel, MD   Brief Narrative:  The patient is a 69 y.o.malewith h/oCVA, HTN, hyperlipidemia,Gout,DM, CBP,PAD,s/pright BKA, chronic anemia, who was admitted on 1/31 to [facility] a fall with inability to get up on his own- prolonged down time. He was found to have a LLE wound/cellulitis with concern for outpatient treatment failure with Clindamycin. He was treated with IV Dapto with plan to transition to Linezolid at d/c (due to h/o MRSA bacteremia/diskitis in Nov 2020). On the evening of 2/3, he developed abdominal pain with n/v with coffee ground emesis that was gastroccult positive and so he was transferred to [facility] for GI evaluation. Patient also c/o severe back pain. Found to have epidural abscess likely from his previous MRSA infection.Underwent IR guided drainage.Currently plan isIV Daptomycin prolonged abx treatment.  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  -He was treated with IV Daptomycin as inpatient and transitioned to [place] at the time of d/c to home.  -He was discharged with home health. -MRI this admission shows evidence of epidural abscess. Neuro surgery, Infectious disease consulted.  -Now S/PIR guided drainage of the epidural abscess (on 11/26/2019, cx -ve so far). -Started back on IV daptomycin 2/6 after discussion with ID and will continue and CK was 38 and CRP was 8.3; infectious diseases recommends 8 weeks of IV daptomycin  and have the patient arrange for HSFU -Appreciate their f/u--they recommend central line and transition to OPAT when stable for discharge (when off IV pain meds) and Discharge Disposition secure (CIR vs. SNF) -OOB to Chair if able -Continues to have significant pain  Hematemesis -H&H remained stable and went from 9.1/28.2 -> 8.9/27.5 -SP upper GI endoscopy by Dr. Benson Norway. -No evidence of acute abnormality. -Continue PPI patient is back on regular diet. Avoid NSAIDs. -DVT prophylaxis resumed today with heparin  Chronic Pain Syndrome -Patient is on chronic oxycodone 30 mg 3 times daily as well as Tizanidine 2 m po q8h -Now, additionally, he is on Oxycodone 10 mg every 4 hours PRN and Dilaudid 1 mg IV  q6hfor severe pain -Added Lidocaine patch--claims does not help -Watch for drug seeking behavior and Judicious Use of IV Narcotics -Continues to complain of some pain    BPH -Continue Tamsulosin 0.4 mg po Daily and Finasteride 5 mg po Daily   Chronic kidney disease stage IV -Renal function stable as BUN/Cr now slightly worsened to 30/2.71 -Avoid nephrotoxic medications, contrast dyes, hypotension and renally adjust medications -Patient's BUN/creatinine had improved from admission down to 28/2.26 and is now as listed as above -Continue monitor and trend renal function -Repeat CMP in a.m.  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 -Continue home regimen with Amlodipine 10 mg po Daily, Clonidine 0.3 mg TID, Finasteride 5 mg po Daily, Hydralazine  50 mg po q8h, and Metoprolol Tartrate 50 mg po BID  -C/w Labetalol 10 mg q2hprn For SBP>160 -Patient's BP was 160/91  Left heel and foot unstageable ulcer. -POA.Dressing changes per wound  care -S/p right BKA -Left ABI pending (ordered 2/4) and done yesterday AM and showed "Left: Resting left ankle-brachial index indicates noncompressible left lower extremity arteries." -Currently on IV antibiotics with Daptomycin.  History of Gout -Continue Allopurinol 100 mg po BID   Hypothyroidism -C/w Levothyroxine 50 mcg po Daily   DVT prophylaxis: Heparin 5,000 units sq q8h  Code Status: FULL CODE Family Communication: No family present at bedside  Disposition Plan: CIR vs. SNF patient is able to be weaned off of IV narcotics.  PT OT recommending CIR but currently not a candidate at this time as he is not meeting the needs per CIR coordinator.  Will need central access for long-term antibiotic usage for 8 weeks  Consultants:   Gastroenterology  Infectious Diseases    Procedures: None   Antimicrobials:  Anti-infectives (From admission, onward)   Start     Dose/Rate Route Frequency Ordered Stop   11/26/19 1730  DAPTOmycin (CUBICIN) 1,000 mg in sodium chloride 0.9 % IVPB     1,000 mg 240 mL/hr over 30 Minutes Intravenous Every 24 hours 11/26/19 1642       Subjective: Seen and examined at bedside and was complaining of a weird feeling in his body when he received his daptomycin dose yesterday.  Still complains of having significant amount of pain.  States he had some nausea earlier this morning but none now.  No other concerns or complaints at this time.  Objective: Vitals:   11/30/19 0900 11/30/19 1628 11/30/19 2110 12/01/19 0505  BP: (!) 160/83 (!) 150/80 (!) 149/79 (!) 160/91  Pulse: 72 69 75 74  Resp:  18 17 16   Temp: 98.3 F (36.8 C) 98.8 F (37.1 C) 98.2 F (36.8 C) 98.2 F (36.8 C)  TempSrc: Oral Oral    SpO2: 98% 98% 97% 98%  Weight:   111.6 kg   Height:        Intake/Output Summary (Last 24 hours) at 12/01/2019 1833 Last data filed at 12/01/2019 1500 Gross per 24 hour  Intake 1080 ml  Output 1200 ml  Net -120 ml   Filed Weights   11/26/19 2330  11/27/19 2048 11/30/19 2110  Weight: 111.6 kg 111.6 kg 111.6 kg   Examination: Physical Exam:  Constitutional: WN/WD overweight older appearing than his stated age 22 male currently in no acute distress still complaining some pain but appears comfortable today Eyes: Lids and conjunctivae normal, sclerae anicteric  ENMT: External Ears, Nose appear normal. Grossly normal hearing.  Neck: Appears normal, supple, no cervical masses, normal ROM, no appreciable thyromegaly Respiratory: Slightly diminished to auscultation bilaterally, no wheezing, rales, rhonchi or crackles. Normal respiratory effort and patient is not tachypenic. No accessory muscle use.  Unlabored breathing Cardiovascular: RRR, no murmurs / rubs / gallops. S1 and S2 auscultated.  Left leg is wrapped and has a right BKA Abdomen: Soft, non-tender, distended secondary body habitus. No masses palpated. No appreciable hepatosplenomegaly. Bowel sounds positive.  GU: Deferred. Musculoskeletal: No clubbing / cyanosis of digits/nails.  Patient has a right BKA Skin: No rashes, lesions, ulcers on limited skin evaluation left leg is wrapped and I did not view his foot ulcers.  No induration.  Skin is warm and dry. No induration; Warm and dry.  Neurologic: CN 2-12 grossly intact with no focal deficits.  Romberg sign and cerebellar reflexes not assessed.  Psychiatric: Normal judgment and insight. Alert and oriented x 3. Normal mood and appropriate affect.   Data Reviewed: I have personally reviewed following labs and imaging studies  CBC: Recent Labs  Lab 11/26/19 0754 11/28/19 0505 11/29/19 0557 11/30/19 0539 12/01/19 0441  WBC 3.8* 4.8 9.2 6.7 7.7  NEUTROABS 3.0  --   --   --  5.1  HGB 8.9* 9.0* 8.9* 9.1* 8.9*  HCT 28.7* 28.4* 28.0* 28.2* 27.5*  MCV 98.6 99.0 98.9 97.2 96.2  PLT 125* 164 177 182 [id number]   Basic Metabolic Panel: Recent Labs  Lab 11/25/19 0420 11/26/19 0754 11/28/19 0505 11/30/19 0824 12/01/19 0441  NA 143  141 141 138 139  K 4.3 3.7 3.7 4.1 4.1  CL 105 104 107 102 100  CO2 28 26 25 27 24   GLUCOSE 141* 152* 157* 167* 164*  BUN 44* 39* 28* 25* 30*  CREATININE 2.83* 2.67* 2.26* 2.40* 2.71*  CALCIUM 8.8* 8.3* 8.5* 8.7* 8.9  MG  --   --  1.7 1.7 1.7  PHOS  --   --   --  4.6 4.8*   GFR: Estimated Creatinine Clearance: 37.7 mL/min (A) (by C-G formula based on SCr of 2.71 mg/dL (H)). Liver Function Tests: Recent Labs  Lab 11/26/19 0754 11/30/19 0824 12/01/19 0441  AST 29 22 23   ALT 15 13 13   ALKPHOS 55 80 73  BILITOT 0.5 0.4 0.2*  PROT 6.6 6.9 6.7  ALBUMIN 2.6* 2.8* 2.7*   No results for input(s): LIPASE, AMYLASE in the last 168 hours. No results for input(s): AMMONIA in the last 168 hours. Coagulation Profile: Recent Labs  Lab 11/26/19 0754  INR 1.2   Cardiac Enzymes: Recent Labs  Lab 11/28/19 0505  CKTOTAL 38*   BNP (last 3 results) No results for input(s): PROBNP in the last 8760 hours. HbA1C: No results for input(s): HGBA1C in the last 72 hours. CBG: Recent Labs  Lab 11/30/19 1625 11/30/19 2109 12/01/19 0721 12/01/19 1119 12/01/19 1613  GLUCAP 169* 145* 143* 180* 138*   Lipid Profile: No results for input(s): CHOL, HDL, LDLCALC, TRIG, CHOLHDL, LDLDIRECT in the last 72 hours. Thyroid Function Tests: No results for input(s): TSH, T4TOTAL, FREET4, T3FREE, THYROIDAB in the last 72 hours. Anemia Panel: No results for input(s): VITAMINB12, FOLATE, FERRITIN, TIBC, IRON, RETICCTPCT in the last 72 hours. Sepsis Labs: No results for input(s): PROCALCITON, LATICACIDVEN in the last 168 hours.  Recent Results (from the past 240 hour(s))  Blood Cultures x 2 sites     Status: None   Collection Time: 11/24/19 10:50 AM   Specimen: BLOOD LEFT HAND  Result Value Ref Range Status   Specimen Description BLOOD LEFT HAND  Final   Special Requests   Final    BOTTLES DRAWN AEROBIC AND ANAEROBIC Blood Culture results may not be optimal due to an inadequate volume of blood  received in culture bottles   Culture   Final    NO GROWTH 5 DAYS Performed at [facility], [place] [address], [place], [place] [address]    Report Status 11/29/2019 FINAL  Final  Blood Cultures x 2 sites     Status: None   Collection Time: 11/24/19 10:55 AM   Specimen: BLOOD RIGHT HAND  Result Value Ref Range Status   Specimen Description BLOOD RIGHT HAND  Final   Special Requests   Final    BOTTLES DRAWN AEROBIC AND ANAEROBIC Blood Culture adequate volume  Culture   Final    NO GROWTH 5 DAYS Performed at [facility], [place] [address], [place], [place] [address]    Report Status 11/29/2019 FINAL  Final  MRSA PCR Screening     Status: None   Collection Time: 11/24/19  2:59 PM   Specimen: Nasopharyngeal  Result Value Ref Range Status   MRSA by PCR NEGATIVE NEGATIVE Final    Comment:        The GeneXpert MRSA Assay (FDA approved for NASAL specimens only), is one component of a comprehensive MRSA colonization surveillance program. It is not intended to diagnose MRSA infection nor to guide or monitor treatment for MRSA infections. Performed at [facility], [place] [address] [place], [place] [address]   Aerobic/Anaerobic Culture (surgical/deep wound)     Status: None   Collection Time: 11/26/19  2:41 PM   Specimen: NEEDLE ASPIRATE  Result Value Ref Range Status   Specimen Description NEEDLE ASPIRATE DISC  Final   Special Requests NONE  Final   Gram Stain NO WBC SEEN NO ORGANISMS SEEN   Final   Culture   Final    No growth aerobically or anaerobically. Performed at [facility], [place] [address], [place], [place] [address]    Report Status 12/01/2019 FINAL  Final     RN Pressure Injury Documentation: Pressure Injury 11/24/19 Heel Left Unstageable - Full thickness tissue loss in which the base of the injury is covered by slough (yellow, tan, gray, green or brown) and/or eschar (tan, brown or black) in the wound bed. (Active)  11/24/19 0655  Location:  Heel  Location Orientation: Left  Staging: Unstageable - Full thickness tissue loss in which the base of the injury is covered by slough (yellow, tan, gray, green or brown) and/or eschar (tan, brown or black) in the wound bed.  Wound Description (Comments):   Present on Admission: Yes     Pressure Injury 09/02/19 Foot Left Unstageable - Full thickness tissue loss in which the base of the ulcer is covered by slough (yellow, tan, gray, green or brown) and/or eschar (tan, brown or black) in the wound bed. (Active)  09/02/19 0815  Location: Foot  Location Orientation: Left  Staging: Unstageable - Full thickness tissue loss in which the base of the ulcer is covered by slough (yellow, tan, gray, green or brown) and/or eschar (tan, brown or black) in the wound bed.  Wound Description (Comments):   Present on Admission: Yes     Estimated body mass index is 28.44 kg/m as calculated from the following:   Height as of this encounter: 6\' 6"  (1.981 m).   Weight as of this encounter: 111.6 kg.  Malnutrition Type:  Nutrition Problem: Increased nutrient needs Etiology: wound healing   Malnutrition Characteristics:  Signs/Symptoms: estimated needs   Nutrition Interventions:  Interventions: Boost Breeze, Juven   Radiology Studies: VAS [place] ABI WITH/WO TBI  Result Date: 12/01/2019 LOWER EXTREMITY DOPPLER STUDY Indications: Ulceration, and right BKA. High Risk Factors: Hypertension, Diabetes.  Comparison Study: no prior Performing Technologist: Abram Sander RVS  Examination Guidelines: A complete evaluation includes at minimum, Doppler waveform signals and systolic blood pressure reading at the level of bilateral brachial, anterior tibial, and posterior tibial arteries, when vessel segments are accessible. Bilateral testing is considered an integral part of a complete examination. Photoelectric Plethysmograph (PPG) waveforms and toe systolic pressure readings are included as required and additional  duplex testing as needed. Limited examinations for reoccurring indications may  be performed as noted.  ABI Findings: +--------+------------------+-----+---------+--------+ Right   Rt Pressure (mmHg)IndexWaveform Comment  +--------+------------------+-----+---------+--------+ [id number]                    triphasic         +--------+------------------+-----+---------+--------+ +--------+------------------+-----+---------+-------+ Left    Lt Pressure (mmHg)IndexWaveform Comment +--------+------------------+-----+---------+-------+ [id number]                    triphasic        +--------+------------------+-----+---------+-------+ PTA     205               1.31 triphasic        +--------+------------------+-----+---------+-------+ DP      187               1.19 triphasic        +--------+------------------+-----+---------+-------+ +-------+-----------+-----------+------------+------------+ ABI/TBIToday's ABIToday's TBIPrevious ABIPrevious TBI +-------+-----------+-----------+------------+------------+ Left   1.19                                           +-------+-----------+-----------+------------+------------+  Summary: Right: RT BKA. Left: Resting left ankle-brachial index indicates noncompressible left lower extremity arteries.  *See table(s) above for measurements and observations.  Electronically signed by Harold Barban MD on 12/01/2019 at 7:27:49 AM.   Final     Scheduled Meds: . allopurinol  100 mg Oral BID  . amLODipine  10 mg Oral Daily  . cloNIDine  0.3 mg Oral TID  . feeding supplement (ENSURE ENLIVE)  237 mL Oral BID BM  . finasteride  5 mg Oral Daily  . heparin injection (subcutaneous)  5,000 Units Subcutaneous Q8H  . hydrALAZINE  50 mg Oral Q8H  . insulin aspart  0-15 Units Subcutaneous TID WC  . insulin aspart  0-5 Units Subcutaneous QHS  . levothyroxine  50 mcg Oral Q0600  . lidocaine  2 patch Transdermal Q24H  . metoprolol tartrate  50  mg Oral BID  . nutrition supplement (JUVEN)  1 packet Oral BID BM  . oxyCODONE  30 mg Oral TID  . pantoprazole  40 mg Oral Daily  . sodium chloride flush  3 mL Intravenous Q12H  . tamsulosin  0.4 mg Oral Daily   Continuous Infusions: . sodium chloride Stopped (11/27/19 2024)  . DAPTOmycin (CUBICIN)  IV 1,000 mg (11/30/19 2010)    LOS: 7 days   Kerney Elbe, DO [facility] PAGER is on AMION  If 7PM-7AM, please contact night-coverage [email or web address]

## 2019-12-01 NOTE — Progress Notes (Signed)
Nutrition Follow up  DOCUMENTATION CODES:   Not applicable  INTERVENTION:    Ensure Enlive po BID, each supplement provides 350 kcal and 20 grams of protein  Juven Fruit Punch BID, each serving provides 95kcal and 2.5g of protein (amino acids glutamine and arginine)  NUTRITION DIAGNOSIS:   Increased nutrient needs related to wound healing as evidenced by estimated needs.  Ongoing  GOAL:   Patient will meet greater than or equal to 90% of their needs   Progressing   MONITOR:   Diet advancement, PO intake, Supplement acceptance, Weight trends, Labs, I & O's, Skin  REASON FOR ASSESSMENT:   Consult Wound healing  ASSESSMENT:   Patient with PMH significant for CVA, HTN, HLD, DM, DM, PAD s/p R BKA, and chronic back pain. Presents this admission with hematemesis, diskitis, and LLE wounds.   Pt reports having good appetite. Meal completions charted as 85-100% for his last eight meals. Drinking Juven twice daily (as much as he can). Does not like Boost Breeze and would prefer different flavors. RD to make adjustments. Plan d/c to SNF.   Admission weight: 109.1 kg  Current weight: 111.6 kg   I/O: 1,553 ml since admit UOP: 2,225 ml x 24 hrs   Medications: SS novolog  Labs: Cr 2.71-up from yesterday CBG 143-217  Diet Order:   Diet Order            Diet regular Room service appropriate? Yes; Fluid consistency: Thin  Diet effective now              EDUCATION NEEDS:   Not appropriate for education at this time  Skin:  Skin Assessment: Skin Integrity Issues: Skin Integrity Issues:: Unstageable Unstageable: L heel  Last BM:  2/8  Height:   Ht Readings from Last 1 Encounters:  11/24/19 6\' 6"  (1.981 m)    Weight:   Wt Readings from Last 1 Encounters:  11/30/19 111.6 kg    Ideal Body Weight:  91 kg(adjusted R BKA)  BMI:  Body mass index is 28.44 kg/m.  Estimated Nutritional Needs:   Kcal:  2400-2600 kcal  Protein:  120-135 grams  Fluid:  >/= 2.4  L/day   Mariana Single RD, LDN Clinical Nutrition Pager listed in [place]

## 2019-12-02 ENCOUNTER — Inpatient Hospital Stay (HOSPITAL_COMMUNITY): Payer: Medicare Other

## 2019-12-02 LAB — CBC WITH DIFFERENTIAL/PLATELET
Abs Immature Granulocytes: 0.03 10*3/uL (ref 0.00–0.07)
Basophils Absolute: 0 10*3/uL (ref 0.0–0.1)
Basophils Relative: 1 %
Eosinophils Absolute: 0.4 10*3/uL (ref 0.0–0.5)
Eosinophils Relative: 6 %
HCT: 27.6 % — ABNORMAL LOW (ref 39.0–52.0)
Hemoglobin: 8.9 g/dL — ABNORMAL LOW (ref 13.0–17.0)
Immature Granulocytes: 0 %
Lymphocytes Relative: 23 %
Lymphs Abs: 1.7 10*3/uL (ref 0.7–4.0)
MCH: 31.7 pg (ref 26.0–34.0)
MCHC: 32.2 g/dL (ref 30.0–36.0)
MCV: 98.2 fL (ref 80.0–100.0)
Monocytes Absolute: 0.6 10*3/uL (ref 0.1–1.0)
Monocytes Relative: 8 %
Neutro Abs: 4.8 10*3/uL (ref 1.7–7.7)
Neutrophils Relative %: 62 %
Platelets: 223 10*3/uL (ref 150–400)
RBC: 2.81 MIL/uL — ABNORMAL LOW (ref 4.22–5.81)
RDW: 14.3 % (ref 11.5–15.5)
WBC: 7.6 10*3/uL (ref 4.0–10.5)
nRBC: 0 % (ref 0.0–0.2)

## 2019-12-02 LAB — COMPREHENSIVE METABOLIC PANEL
ALT: 14 U/L (ref 0–44)
AST: 21 U/L (ref 15–41)
Albumin: 2.7 g/dL — ABNORMAL LOW (ref 3.5–5.0)
Alkaline Phosphatase: 74 U/L (ref 38–126)
Anion gap: 11 (ref 5–15)
BUN: 33 mg/dL — ABNORMAL HIGH (ref 8–23)
CO2: 23 mmol/L (ref 22–32)
Calcium: 8.5 mg/dL — ABNORMAL LOW (ref 8.9–10.3)
Chloride: 103 mmol/L (ref 98–111)
Creatinine, Ser: 2.96 mg/dL — ABNORMAL HIGH (ref 0.61–1.24)
GFR calc Af Amer: 24 mL/min — ABNORMAL LOW (ref >60)
GFR calc non Af Amer: 21 mL/min — ABNORMAL LOW (ref >60)
Glucose, Bld: 178 mg/dL — ABNORMAL HIGH (ref 70–99)
Potassium: 4.3 mmol/L (ref 3.5–5.1)
Sodium: 137 mmol/L (ref 135–145)
Total Bilirubin: 0.5 mg/dL (ref 0.3–1.2)
Total Protein: 6.7 g/dL (ref 6.5–8.1)

## 2019-12-02 LAB — GLUCOSE, CAPILLARY
Glucose-Capillary: 181 mg/dL — ABNORMAL HIGH (ref 70–99)
Glucose-Capillary: 140 mg/dL — ABNORMAL HIGH (ref 70–99)
Glucose-Capillary: 127 mg/dL — ABNORMAL HIGH (ref 70–99)
Glucose-Capillary: 213 mg/dL — ABNORMAL HIGH (ref 70–99)

## 2019-12-02 LAB — MAGNESIUM: Magnesium: 1.8 mg/dL (ref 1.7–2.4)

## 2019-12-02 LAB — PHOSPHORUS: Phosphorus: 5.7 mg/dL — ABNORMAL HIGH (ref 2.5–4.6)

## 2019-12-02 IMAGING — CT CT ABD-PELV W/O CM
2 of 4 series · 17 of 46 positions shown, 19 images · non-contrast
Comparison: [DATE], [DATE], MERI YE 620

CLINICAL DATA: Abdominal pain, fever, vertebral osteomyelitis and
discitis

EXAM:
CT ABDOMEN AND PELVIS WITHOUT CONTRAST
TECHNIQUE: Multidetector CT imaging of the abdomen and pelvis was performed
following the standard protocol without IV contrast.

[Series 3: abd/ pelvis 5.0 i30f 2 (person_name) · axial · 0.98mm/px · z∈[+1048,+1543]mm · 14 of 107 slices shown, 16 images]
[im 4/107  soft-tissue]
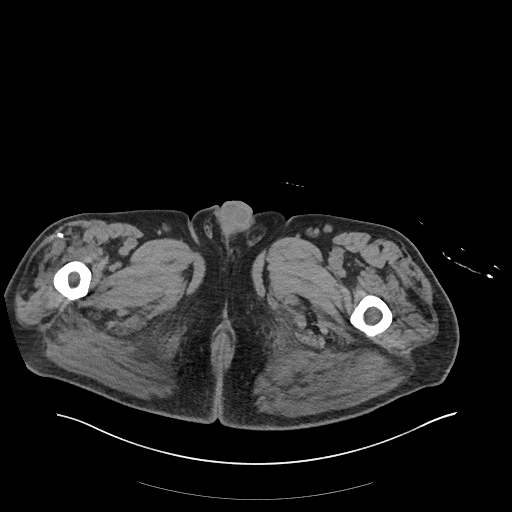
[im 4/107  bone]
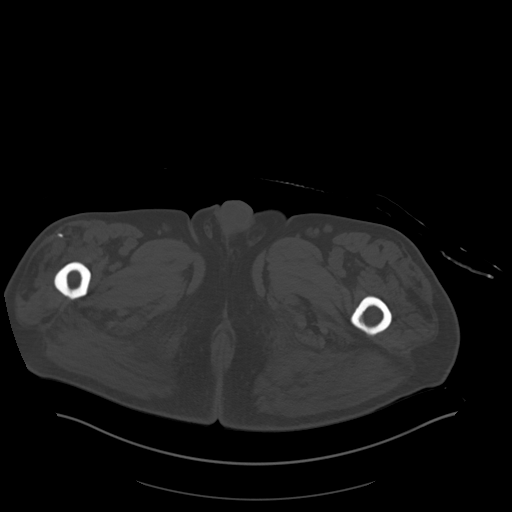
[im 12/107  soft-tissue]
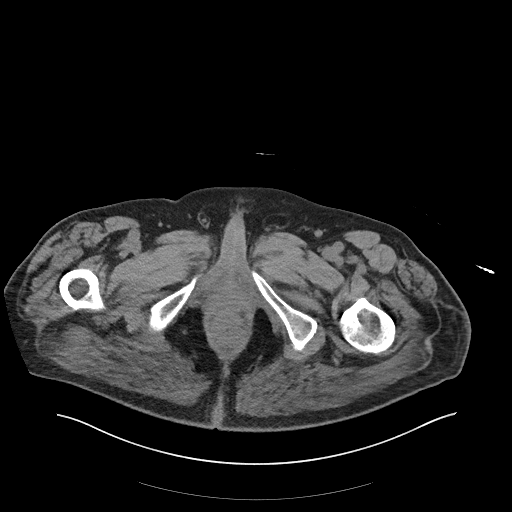
[im 20/107  soft-tissue]
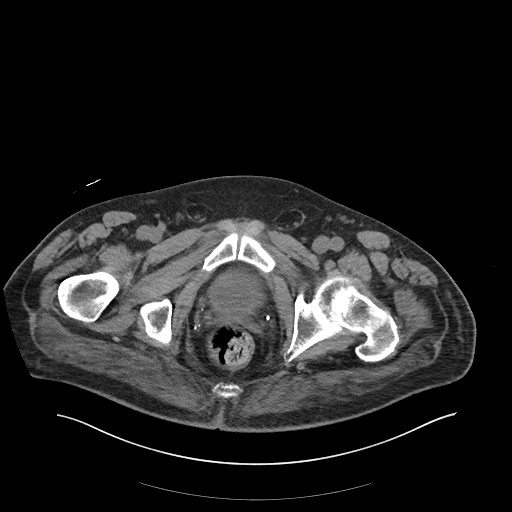
[im 28/107  soft-tissue]
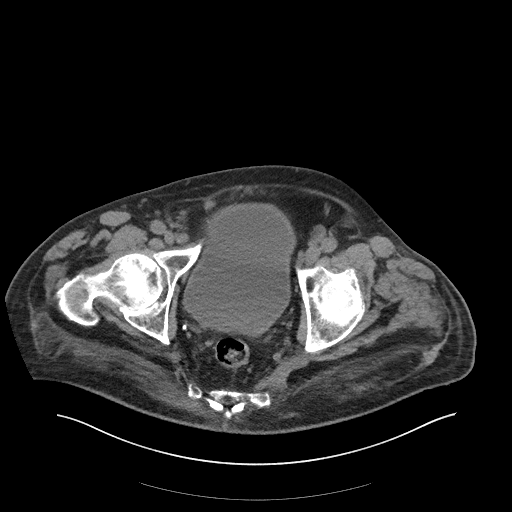
[im 36/107  soft-tissue]
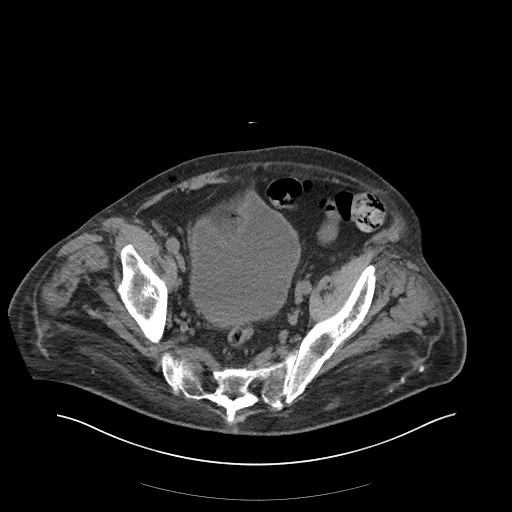
[im 44/107  soft-tissue]
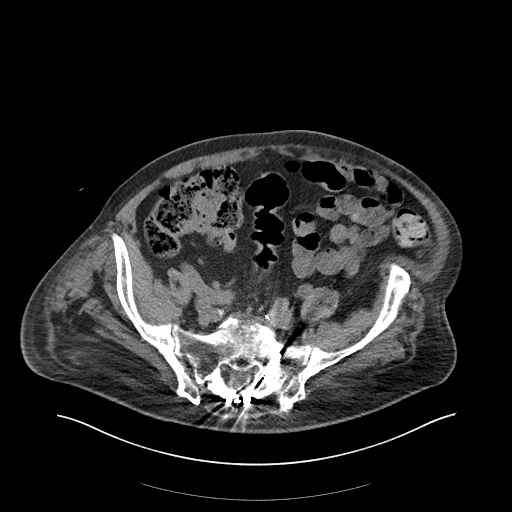
[im 52/107  soft-tissue]
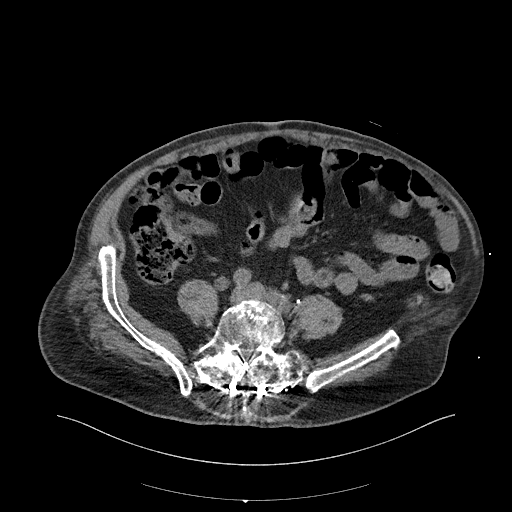
[im 55/107  soft-tissue]
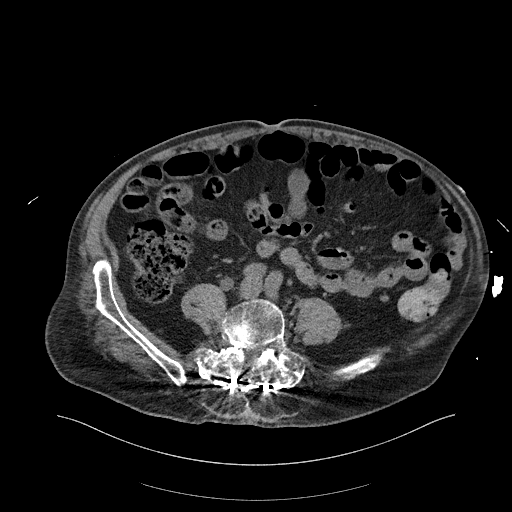
[im 63/107  soft-tissue]
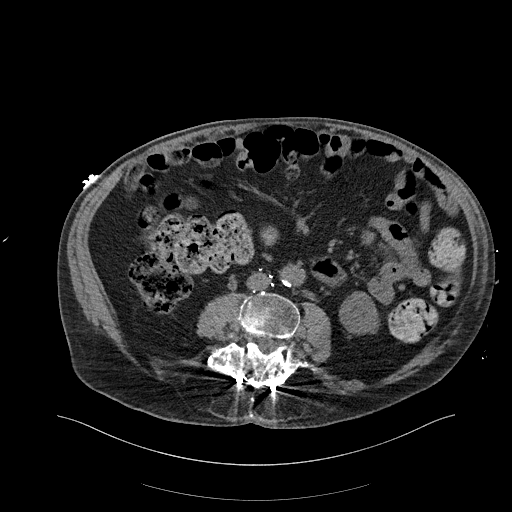
[im 63/107  bone]
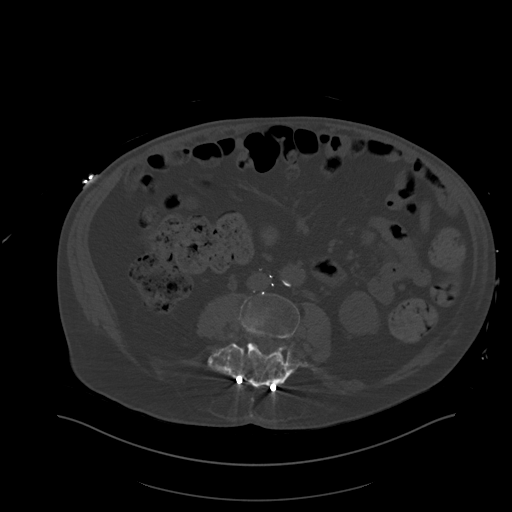
[im 71/107  soft-tissue]
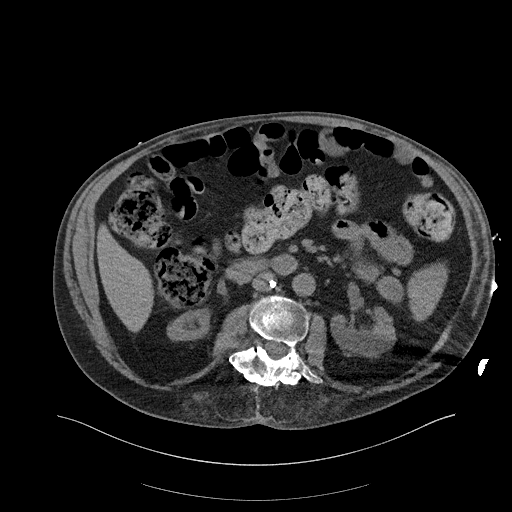
[im 79/107  soft-tissue]
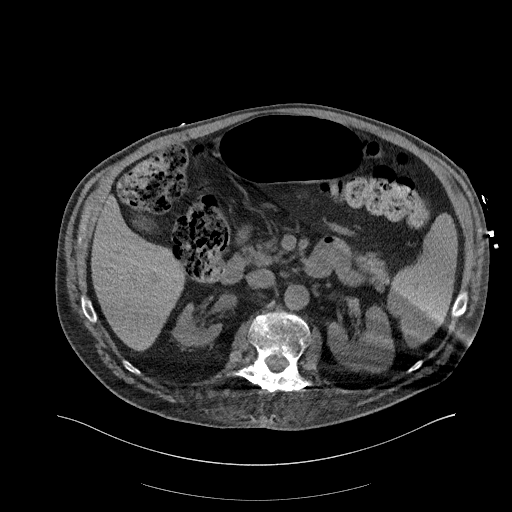
[im 87/107  soft-tissue]
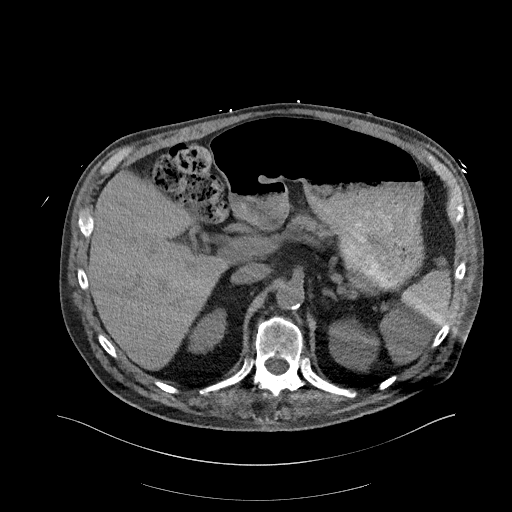
[im 95/107  soft-tissue]
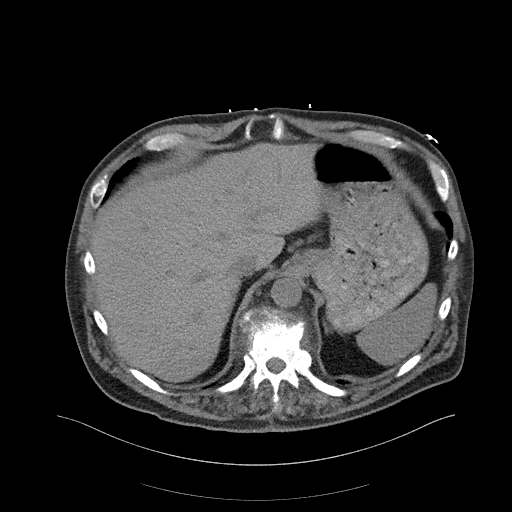
[im 103/107  soft-tissue]
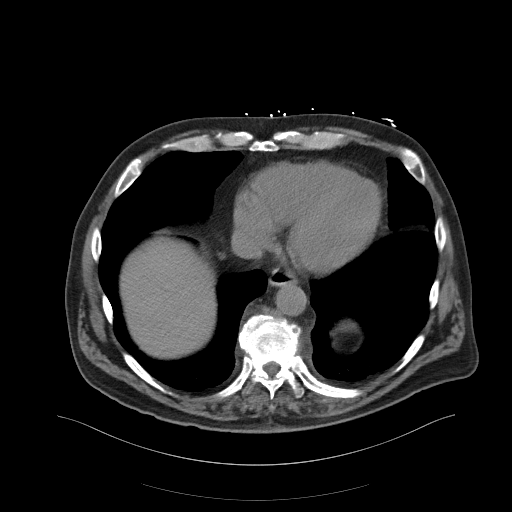

[Series 6: cor st · coronal · 1.10mm/px · 3 of 125 slices shown]
[im 42/125  soft-tissue]
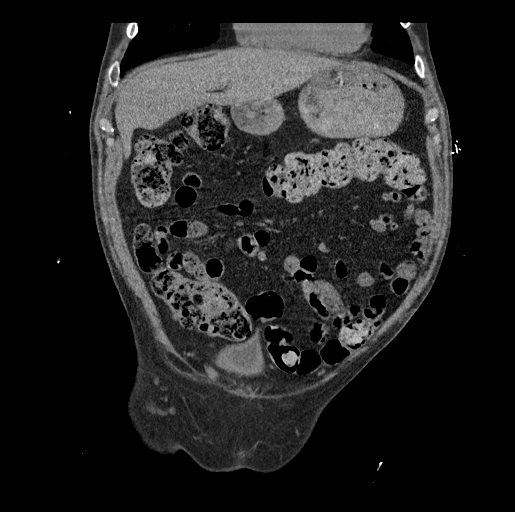
[im 56/125  soft-tissue]
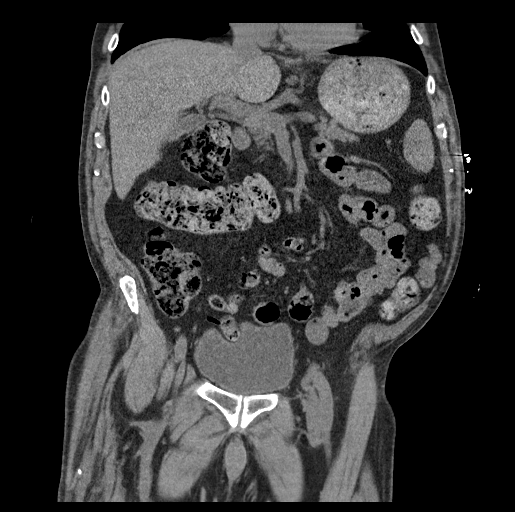
[im 69/125  soft-tissue]
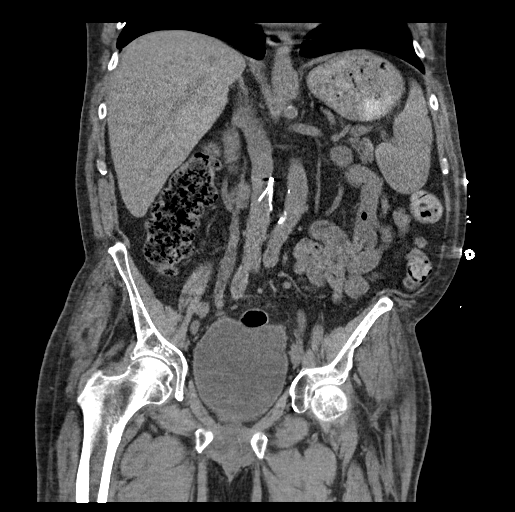

[17 of 46 positions shown; findings below may reference images not displayed]

FINDINGS: Lower chest: No acute pleural or parenchymal lung disease.

Hepatobiliary: No focal liver abnormality is seen. No gallstones,
gallbladder wall thickening, or biliary dilatation.

Pancreas: Unremarkable. No pancreatic ductal dilatation or
surrounding inflammatory changes.

Spleen: Normal in size without focal abnormality.

Adrenals/Urinary Tract: There is chronic right renal atrophy. No
urinary tract calculi or obstructive uropathy. Bladder is
unremarkable. Adrenals are normal.

Stomach/Bowel: No bowel obstruction or ileus. Normal appendix right
lower quadrant. No inflammatory changes.

Vascular/Lymphatic: IVC filter is noted. Minimal atherosclerosis of
the aorta. No pathologic adenopathy.

Reproductive: Prostate is unremarkable.

Other: No abdominal wall hernia or abnormality. No abdominopelvic
ascites.

Musculoskeletal: Continued destructive changes at the T10/T11 disc
space consistent with diskitis and vertebral osteomyelitis.
Paraspinal phlegmon is noted.
IMPRESSION: 1. Persistent destructive changes from T10/T11 discitis and
osteomyelitis.
2. Otherwise no acute intra-abdominal or intrapelvic process.

## 2019-12-02 MED ORDER — SODIUM CHLORIDE 0.9 % IV SOLN: INTRAVENOUS | Status: AC

## 2019-12-02 NOTE — Plan of Care (Signed)
  Problem: Education: Goal: Knowledge of General Education information will improve Description: Including pain rating scale, medication(s)/side effects and non-pharmacologic comfort measures Outcome: Progressing   Problem: Nutrition: Goal: Adequate nutrition will be maintained Outcome: Progressing   Problem: Coping: Goal: Level of anxiety will decrease Outcome: Progressing   

## 2019-12-02 NOTE — Progress Notes (Signed)
OT Cancellation Note  Patient Details Name: JOELLE BRANCO MRN: [id number] DOB: [birth year]   Cancelled Treatment:    Reason Eval/Treat Not Completed: Other (comment);Pain limiting ability to participate. Pt eating lunch in bed and stating that he was still working on it and that he would try to work with OT later. Pt also stated, "I can't do much, but I'll try". OT will try back as time allows or next available day/time  Britt Bottom 12/02/2019, 12:24 PM

## 2019-12-02 NOTE — Progress Notes (Signed)
Pharmacy Antibiotic Note  Thomas [name] is a 69 y.o. 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 Was last on daptomycin 08/31/19 through 10/09/19 then discharged on dalbavancin weekly until around 10/27/19. Pharmacy has been consulted for daptomycin dosing. MRI this admission found progressive osteomyelitis and persistent cellulitis and abscess at T10-11 with probable epidural abscess. Patient underwent IR disc aspiration on 2/6 and culture is no growth to date. Patient was started on daptomycin 1000mg  IV q24h.   WBC 7.6, Scr trending up to 2.96 today (CrCl 34 mL/min), afebrile. CK last check 2/8 was 38.    Plan: Continue daptomycin 1000mg  IV q24h - will monitor Scr trend closely Monitor CK every Monday Monitor cultures/sensitivities, renal function, and clinical progression   Height: 6\' 6"  (198.1 cm) Weight: 245 lb 9.5 oz (111.4 kg) IBW/kg (Calculated) : 91.4  Temp (24hrs), Avg:98 F (36.7 C), Min:97.5 F (36.4 C), Max:98.6 F (37 C)  Recent Labs  Lab 11/26/19 0754 11/26/19 0754 11/28/19 0505 11/29/19 0557 11/30/19 0539 11/30/19 0824 12/01/19 0441 12/02/19 0353  WBC 3.8*   < > 4.8 9.2 6.7  --  7.7 7.6  CREATININE 2.67*  --  2.26*  --   --  2.40* 2.71* 2.96*   < > = values in this interval not displayed.    Estimated Creatinine Clearance: 34.5 mL/min (A) (by C-G formula based on SCr of 2.96 mg/dL (H)).     Antimicrobials this admission: Daptomycin 2/6 >>    Microbiology results: 2/4 BCx: ngt 2/4 MRSA PCR: neg 2/6 epidural abscess: ngtd   Antonietta Jewel, PharmD, BCCCP Clinical Pharmacist  Phone: [phone]  Please check AMION for all [place] phone numbers After 10:00 PM, call [place] [phone]

## 2019-12-02 NOTE — Progress Notes (Signed)
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 Patient transferred from [facility] for MRSA bacteremia. CT abdomen showing discitis involvling T10, T11 area.    PT Comments    Mobility continues to be significantly limited by pain. Pt unable to achieve sitting EOB. Pt repositioned supine in bed at end of session. Prosthetic gel sleeve donned RLE in bed to assist with maintaining stump shape. Padding taped around pin at bottom to prevent possibility of skin injury to LLE. NT advised. If lack of mobility progress continues next session, will need to change d/c plan to SNF.    Follow Up Recommendations  CIR     Equipment Recommendations  None recommended by PT    Recommendations for Other Services       Precautions / Restrictions Precautions Precautions: Back;Fall Precaution Comments: has full lumbar fusion Restrictions Weight Bearing Restrictions: No    Mobility  Bed Mobility Overal bed mobility: Needs Assistance Bed Mobility: Rolling;Sidelying to Sit Rolling: Supervision Sidelying to sit: Mod assist;HOB elevated       General bed mobility comments: Pt unable to fully transition to sitting EOB due to pain. Pt using rail to roll into right sidelying.  Transfers                    Ambulation/Gait                 Stairs             Wheelchair Mobility    Modified Rankin (Stroke Patients Only)       Balance                                            Cognition Arousal/Alertness: Awake/alert Behavior During Therapy: Anxious Overall Cognitive Status: Within Functional Limits for tasks assessed                                         Exercises      General Comments        Pertinent Vitals/Pain Pain Assessment: Faces Faces Pain Scale: Hurts whole lot Pain Location: back with mobility Pain Descriptors / Indicators: Grimacing;Guarding;Discomfort Pain Intervention(s): Patient requesting pain meds-RN notified;Limited activity within patient's tolerance;Repositioned    Home Living                      Prior Function            PT Goals (current goals can now be found in the care plan section) Acute Rehab PT Goals Patient Stated Goal: get home Progress towards PT goals: Not progressing toward goals - comment(pain)    Frequency    Min 3X/week      PT Plan Current plan remains appropriate    Co-evaluation              AM-PAC PT "6 Clicks" Mobility   Outcome Measure  Help needed turning from your back to your side while in a flat  bed without using bedrails?: A Little Help needed moving from lying on your back to sitting on the side of a flat bed without using bedrails?: A Lot Help needed moving to and from a bed to a chair (including a wheelchair)?: Total Help needed standing up from a chair using your arms (e.g., wheelchair or bedside chair)?: Total Help needed to walk in hospital room?: Total Help needed climbing 3-5 steps with a railing? : Total 6 Click Score: 9    End of Session   Activity Tolerance: Patient limited by pain Patient left: in bed;with call bell/phone within reach;with bed alarm set Nurse Communication: Mobility status;Patient requests pain meds PT Visit Diagnosis: History of falling (Z91.81);Pain     Time: [id number] PT Time Calculation (min) (ACUTE ONLY): 16 min  Charges:  $Therapeutic Activity: 8-22 mins                     Thomas [name], PT  Office # [phone] Pager [phone]    Thomas [name] 12/02/2019, 11:25 AM

## 2019-12-02 NOTE — Progress Notes (Signed)
PROGRESS NOTE    Thomas [name]  [id number] DOB: 1951/07/09 DOA: 11/24/2019 PCP: Antonietta Jewel, MD   Brief Narrative:  The patient is a 69 y.o.malewith h/oCVA, HTN, hyperlipidemia,Gout,DM, CBP,PAD,s/pright BKA, chronic anemia, who was admitted on 1/31 to [facility] a fall with inability to get up on his own- prolonged down time. He was found to have a LLE wound/cellulitis with concern for outpatient treatment failure with Clindamycin. He was treated with IV Dapto with plan to transition to Linezolid at d/c (due to h/o MRSA bacteremia/diskitis in Nov 2020). On the evening of 2/3, he developed abdominal pain with n/v with coffee ground emesis that was gastroccult positive and so he was transferred to [facility] for GI evaluation. Patient also c/o severe back pain. Found to have epidural abscess likely from his previous MRSA infection.Underwent IR guided drainage.Currently plan isIV Daptomycin prolonged abx treatment.  Was still complaining of some right-sided flank and abdominal pain so we will obtain a CT of the abdomen pelvis but will have to do it without contrast difficult given his worsening renal function.  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  -He was treated with IV Daptomycin as inpatient and transitioned to [place] at the time of d/c to home.  -He was discharged with home health. -MRI this admission shows evidence of epidural abscess. Neuro surgery, Infectious disease  consulted.  -Now S/PIR guided drainage of the epidural abscess (on 11/26/2019, cx -ve so far). -Started back on IV daptomycin 2/6 after discussion with ID and will continue and CK was 38 and CRP was 8.3; infectious diseases recommends 8 weeks of IV daptomycin and have the patient arrange for HSFU -Appreciate their f/u--they recommend central line and transition to OPAT when stable for discharge (when off IV pain meds) and Discharge Disposition secure (CIR vs. SNF) -OOB to Chair if able -Continues to have significant pain and complaining of right-sided abdominal pain flank pain so obtain a CT of the abdomen pelvis with oral contrast  Hematemesis -H&H remained stable and went from 9.1/28.2 -> 8.9/27.5 -> 8.9/27.6 -SP upper GI endoscopy by Dr. Benson Norway. -No evidence of acute abnormality. -Continue PPI patient is back on regular diet. Avoid NSAIDs. -DVT prophylaxis resumed today with heparin  Chronic Pain Syndrome -Patient is on chronic oxycodone 30 mg 3 times daily as well as Tizanidine 2 m po q8h -Now, additionally, he is on Oxycodone 10 mg every 4 hours PRN and Dilaudid 1 mg IV  q6hfor severe pain -Added Lidocaine patch--claims does not help -Watch for drug seeking behavior and Judicious Use of IV Narcotics -Check CT of the Abd/Pelvis  -Continues to complain of some pain    BPH -Continue Tamsulosin 0.4 mg po Daily and Finasteride 5 mg po Daily   Acute Kidney Injury on Chronic kidney disease stage IV Hyperphosphatemia -Renal function started worsening and BUN/Cr now went from 30/2.71 -> 33/2.96 -Patient's Phos Level now worsened too from 4.8 -> 5.7 -Avoid nephrotoxic medications,  contrast dyes, hypotension and renally adjust medications -Patient's BUN/creatinine had improved from admission down to 28/2.26 and is now is as above -Started IVF hydration with NS at 75 mL/hr -Avoid further nephrotoxic medications, contrast dyes, hypotension and renally dose medications -Continue monitor and  trend renal function -Repeat CMP in a.m.  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 -Continue home regimen with Amlodipine 10 mg po Daily, Clonidine 0.3 mg TID, Finasteride 5 mg po Daily, Hydralazine 50 mg po q8h, and Metoprolol Tartrate 50 mg po BID  -C/w Labetalol 10 mg q2hprn For SBP>160 -Patient's BP was 139/75  Left heel and foot unstageable ulcer. -POA.Dressing changes per wound care -S/p right BKA -Left ABI pending (ordered 2/4) and done yesterday AM and showed "Left: Resting left ankle-brachial index indicates noncompressible left lower extremity arteries." -Currently on IV antibiotics with Daptomycin. -Can follow up with Vascular in the outpatient setting   History of Gout -Continue Allopurinol 100 mg po BID   Hypothyroidism -C/w Levothyroxine 50 mcg po Daily   DVT prophylaxis: Heparin 5,000 units sq q8h  Code Status: FULL CODE Family Communication: No family present at bedside  Disposition Plan: CIR vs. SNF patient is able to be weaned off of IV narcotics.  PT OT recommending CIR but currently not a candidate at this time as he is not meeting the needs per CIR coordinator.  Will need central access for long-term antibiotic usage for 8 weeks and go to SNF; Currently Renal Fxn has worsened and complaining of worsening Right Sided Abdominal/Flank Pain  Consultants:   Gastroenterology  Infectious Diseases    Procedures: None   Antimicrobials:  Anti-infectives (From admission, onward)   Start     Dose/Rate Route Frequency Ordered Stop   11/26/19 1730  DAPTOmycin (CUBICIN) 1,000 mg in sodium chloride 0.9 % IVPB     1,000 mg 240 mL/hr over 30 Minutes Intravenous Every 24 hours 11/26/19 1642       Subjective: Seen and  examined at bedside and states that he had a "terrible morning".  Continues to have significant pain on the right side specifically the right-sided flank and thinks it is from where he fell.  No nausea or vomiting but was nauseous earlier and states that he did not want to make him eat but most of his breakfast was finished.  Denies any lightheadedness or dizziness.  Continues to state that it hurts from his mid sternum all the way down to his pelvis.  No other concerns or complaints at this time.  Objective: Vitals:   12/01/19 0505 12/01/19 2100 12/02/19 0502 12/02/19 0921  BP: (!) 160/91 137/81 126/77 136/86  Pulse: 74 68 68 70  Resp: 16  17 18   Temp: 98.2 F (36.8 C) 98.6 F (37 C) (!) 97.5 F (36.4 C) 98 F (36.7 C)  TempSrc:   Oral Oral  SpO2: 98% 99% 93% 96%  Weight:  111.4 kg    Height:        Intake/Output Summary (Last 24 hours) at 12/02/2019 1634 Last data filed at 12/02/2019 1451 Gross per 24 hour  Intake 680 ml  Output 1400 ml  Net -720 ml   Filed Weights   11/27/19 2048 11/30/19 2110 12/01/19 2100  Weight: 111.6 kg 111.6 kg 111.4 kg   Examination: Physical Exam:  Constitutional: WN/WD overweight  older appearing than his stated age 36 male currently appears somewhat uncomfortable complaining of significant amount of pain today. Eyes: Lids and conjunctivae normal, sclerae anicteric  ENMT: External Ears, Nose appear normal. Grossly normal hearing.  Neck: Appears normal, supple, no cervical masses, normal ROM, no appreciable thyromegaly; no JVD Respiratory: Mildly diminished to auscultation bilaterally, no wheezing, rales, rhonchi or crackles. Normal respiratory effort and patient is not tachypenic. No accessory muscle use.  Unlabored breathing Cardiovascular: RRR, no murmurs / rubs / gallops. S1 and S2 auscultated.  Left leg is wrapped but no appreciable edema and has a right BKA Abdomen: Soft, tender to palpate on the right side; distended secondary body  habitus. No masses palpated. No appreciable hepatosplenomegaly. Bowel sounds positive x4.  GU: Deferred. Musculoskeletal: No clubbing / cyanosis of digits/nails. No joint deformity upper and lower extremities.  Skin: No rashes, lesions, ulcers on a limited skin evaluation but his left leg is wrapped and I did not unwrap them to view his foot ulcers. No induration; Warm and dry.  Neurologic: CN 2-12 grossly intact with no focal deficits. Romberg sign and cerebellar reflexes not assessed.  Psychiatric: Normal judgment and insight. Alert and oriented x 3.  A little anxious mood and appropriate affect.   Data Reviewed: I have personally reviewed following labs and imaging studies  CBC: Recent Labs  Lab 11/26/19 0754 11/26/19 0754 11/28/19 0505 11/29/19 0557 11/30/19 0539 12/01/19 0441 12/02/19 0353  WBC 3.8*   < > 4.8 9.2 6.7 7.7 7.6  NEUTROABS 3.0  --   --   --   --  5.1 4.8  HGB 8.9*   < > 9.0* 8.9* 9.1* 8.9* 8.9*  HCT 28.7*   < > 28.4* 28.0* 28.2* 27.5* 27.6*  MCV 98.6   < > 99.0 98.9 97.2 96.2 98.2  PLT 125*   < > 164 177 182 192 223   < > = values in this interval not displayed.   Basic Metabolic Panel: Recent Labs  Lab 11/26/19 0754 11/28/19 0505 11/30/19 0824 12/01/19 0441 12/02/19 0353  NA 141 141 138 139 137  K 3.7 3.7 4.1 4.1 4.3  CL 104 107 102 100 103  CO2 26 25 27 24 23   GLUCOSE 152* 157* 167* 164* 178*  BUN 39* 28* 25* 30* 33*  CREATININE 2.67* 2.26* 2.40* 2.71* 2.96*  CALCIUM 8.3* 8.5* 8.7* 8.9 8.5*  MG  --  1.7 1.7 1.7 1.8  PHOS  --   --  4.6 4.8* 5.7*   GFR: Estimated Creatinine Clearance: 34.5 mL/min (A) (by C-G formula based on SCr of 2.96 mg/dL (H)). Liver Function Tests: Recent Labs  Lab 11/26/19 0754 11/30/19 0824 12/01/19 0441 12/02/19 0353  AST 29 22 23 21   ALT 15 13 13 14   ALKPHOS 55 80 73 74  BILITOT 0.5 0.4 0.2* 0.5  PROT 6.6 6.9 6.7 6.7  ALBUMIN 2.6* 2.8* 2.7* 2.7*   No results for input(s): LIPASE, AMYLASE in the last 168  hours. No results for input(s): AMMONIA in the last 168 hours. Coagulation Profile: Recent Labs  Lab 11/26/19 0754  INR 1.2   Cardiac Enzymes: Recent Labs  Lab 11/28/19 0505  CKTOTAL 38*   BNP (last 3 results) No results for input(s): PROBNP in the last 8760 hours. HbA1C: No results for input(s): HGBA1C in the last 72 hours. CBG: Recent Labs  Lab 12/01/19 1119 12/01/19 1613 12/01/19 2106 12/02/19 0659 12/02/19 1118  GLUCAP 180* 138* 138* 181* 140*   Lipid  Profile: No results for input(s): CHOL, HDL, LDLCALC, TRIG, CHOLHDL, LDLDIRECT in the last 72 hours. Thyroid Function Tests: No results for input(s): TSH, T4TOTAL, FREET4, T3FREE, THYROIDAB in the last 72 hours. Anemia Panel: No results for input(s): VITAMINB12, FOLATE, FERRITIN, TIBC, IRON, RETICCTPCT in the last 72 hours. Sepsis Labs: No results for input(s): PROCALCITON, LATICACIDVEN in the last 168 hours.  Recent Results (from the past 240 hour(s))  Blood Cultures x 2 sites     Status: None   Collection Time: 11/24/19 10:50 AM   Specimen: BLOOD LEFT HAND  Result Value Ref Range Status   Specimen Description BLOOD LEFT HAND  Final   Special Requests   Final    BOTTLES DRAWN AEROBIC AND ANAEROBIC Blood Culture results may not be optimal due to an inadequate volume of blood received in culture bottles   Culture   Final    NO GROWTH 5 DAYS Performed at [facility], [place] [address], [place], [place] [address]    Report Status 11/29/2019 FINAL  Final  Blood Cultures x 2 sites     Status: None   Collection Time: 11/24/19 10:55 AM   Specimen: BLOOD RIGHT HAND  Result Value Ref Range Status   Specimen Description BLOOD RIGHT HAND  Final   Special Requests   Final    BOTTLES DRAWN AEROBIC AND ANAEROBIC Blood Culture adequate volume   Culture   Final    NO GROWTH 5 DAYS Performed at [facility], [place] [address], [place], [place] [address]    Report Status 11/29/2019 FINAL  Final  MRSA PCR Screening      Status: None   Collection Time: 11/24/19  2:59 PM   Specimen: Nasopharyngeal  Result Value Ref Range Status   MRSA by PCR NEGATIVE NEGATIVE Final    Comment:        The GeneXpert MRSA Assay (FDA approved for NASAL specimens only), is one component of a comprehensive MRSA colonization surveillance program. It is not intended to diagnose MRSA infection nor to guide or monitor treatment for MRSA infections. Performed at [facility], [place] [address], [place], [place] [address]   Aerobic/Anaerobic Culture (surgical/deep wound)     Status: None   Collection Time: 11/26/19  2:41 PM   Specimen: NEEDLE ASPIRATE  Result Value Ref Range Status   Specimen Description NEEDLE ASPIRATE DISC  Final   Special Requests NONE  Final   Gram Stain NO WBC SEEN NO ORGANISMS SEEN   Final   Culture   Final    No growth aerobically or anaerobically. Performed at [facility], [place] [address] [place],  [address]    Report Status 12/01/2019 FINAL  Final     RN Pressure Injury Documentation: Pressure Injury 11/24/19 Heel Left Unstageable - Full thickness tissue loss in which the base of the injury is covered by slough (yellow, tan, gray, green or brown) and/or eschar (tan, brown or black) in the wound bed. (Active)  11/24/19 0655  Location: Heel  Location Orientation: Left  Staging: Unstageable - Full thickness tissue loss in which the base of the injury is covered by slough (yellow, tan, gray, green or brown) and/or eschar (tan, brown or black) in the wound bed.  Wound Description (Comments):   Present on Admission: Yes     Pressure Injury 09/02/19 Foot Left Unstageable - Full thickness tissue loss in which the base of the ulcer is covered by slough (yellow, tan, gray, green or  brown) and/or eschar (tan, brown or black) in the wound bed. (Active)  09/02/19 0815  Location: Foot  Location Orientation: Left  Staging: Unstageable - Full thickness tissue loss in which the base of  the ulcer is covered by slough (yellow, tan, gray, green or brown) and/or eschar (tan, brown or black) in the wound bed.  Wound Description (Comments):   Present on Admission: Yes     Estimated body mass index is 28.38 kg/m as calculated from the following:   Height as of this encounter: 6\' 6"  (1.981 m).   Weight as of this encounter: 111.4 kg.  Malnutrition Type:  Nutrition Problem: Increased nutrient needs Etiology: wound healing   Malnutrition Characteristics:  Signs/Symptoms: estimated needs   Nutrition Interventions:  Interventions: Boost Breeze, Juven   Radiology Studies: No results found.  Scheduled Meds:  allopurinol  100 mg Oral BID   amLODipine  10 mg Oral Daily   cloNIDine  0.3 mg Oral TID   feeding supplement (ENSURE ENLIVE)  237 mL Oral BID BM   finasteride  5 mg Oral Daily   heparin injection (subcutaneous)  5,000 Units Subcutaneous Q8H   hydrALAZINE  50 mg Oral Q8H   insulin aspart  0-15 Units Subcutaneous TID WC   insulin aspart  0-5 Units Subcutaneous QHS   levothyroxine  50 mcg Oral Q0600   lidocaine  2 patch Transdermal Q24H   metoprolol tartrate  50 mg Oral BID   nutrition supplement (JUVEN)  1 packet Oral BID BM   oxyCODONE  30 mg Oral TID   pantoprazole  40 mg Oral Daily   sodium chloride flush  3 mL Intravenous Q12H   tamsulosin  0.4 mg Oral Daily   Continuous Infusions:  sodium chloride Stopped (11/27/19 2024)   sodium chloride 75 mL/hr at 12/02/19 0924   DAPTOmycin (CUBICIN)  IV 1,000 mg (12/01/19 2328)    LOS: 8 days   Kerney Elbe, DO [facility] PAGER is on AMION  If 7PM-7AM, please contact night-coverage [email or web address]

## 2019-12-03 ENCOUNTER — Inpatient Hospital Stay (HOSPITAL_COMMUNITY): Payer: Medicare Other

## 2019-12-03 DIAGNOSIS — R338 Other retention of urine: Secondary | ICD-10-CM

## 2019-12-03 LAB — CHLORIDE, URINE, RANDOM: Chloride Urine: 84 mmol/L

## 2019-12-03 LAB — COMPREHENSIVE METABOLIC PANEL
ALT: 13 U/L (ref 0–44)
AST: 18 U/L (ref 15–41)
Albumin: 2.6 g/dL — ABNORMAL LOW (ref 3.5–5.0)
Alkaline Phosphatase: 75 U/L (ref 38–126)
Anion gap: 9 (ref 5–15)
BUN: 37 mg/dL — ABNORMAL HIGH (ref 8–23)
CO2: 23 mmol/L (ref 22–32)
Calcium: 8.7 mg/dL — ABNORMAL LOW (ref 8.9–10.3)
Chloride: 105 mmol/L (ref 98–111)
Creatinine, Ser: 3.2 mg/dL — ABNORMAL HIGH (ref 0.61–1.24)
GFR calc Af Amer: 22 mL/min — ABNORMAL LOW (ref >60)
GFR calc non Af Amer: 19 mL/min — ABNORMAL LOW (ref >60)
Glucose, Bld: 167 mg/dL — ABNORMAL HIGH (ref 70–99)
Potassium: 4.8 mmol/L (ref 3.5–5.1)
Sodium: 137 mmol/L (ref 135–145)
Total Bilirubin: 0.2 mg/dL — ABNORMAL LOW (ref 0.3–1.2)
Total Protein: 6.7 g/dL (ref 6.5–8.1)

## 2019-12-03 LAB — CBC WITH DIFFERENTIAL/PLATELET
Abs Immature Granulocytes: 0.04 10*3/uL (ref 0.00–0.07)
Basophils Absolute: 0 10*3/uL (ref 0.0–0.1)
Basophils Relative: 1 %
Eosinophils Absolute: 0.4 10*3/uL (ref 0.0–0.5)
Eosinophils Relative: 5 %
HCT: 27.7 % — ABNORMAL LOW (ref 39.0–52.0)
Hemoglobin: 8.6 g/dL — ABNORMAL LOW (ref 13.0–17.0)
Immature Granulocytes: 1 %
Lymphocytes Relative: 17 %
Lymphs Abs: 1.3 10*3/uL (ref 0.7–4.0)
MCH: 31.2 pg (ref 26.0–34.0)
MCHC: 31 g/dL (ref 30.0–36.0)
MCV: 100.4 fL — ABNORMAL HIGH (ref 80.0–100.0)
Monocytes Absolute: 0.5 10*3/uL (ref 0.1–1.0)
Monocytes Relative: 7 %
Neutro Abs: 5.6 10*3/uL (ref 1.7–7.7)
Neutrophils Relative %: 69 %
Platelets: 220 10*3/uL (ref 150–400)
RBC: 2.76 MIL/uL — ABNORMAL LOW (ref 4.22–5.81)
RDW: 14.4 % (ref 11.5–15.5)
WBC: 8 10*3/uL (ref 4.0–10.5)
nRBC: 0 % (ref 0.0–0.2)

## 2019-12-03 LAB — URINALYSIS, ROUTINE W REFLEX MICROSCOPIC
Bacteria, UA: NONE SEEN
Bilirubin Urine: NEGATIVE
Bilirubin Urine: NEGATIVE
Glucose, UA: NEGATIVE mg/dL
Glucose, UA: NEGATIVE mg/dL
Ketones, ur: NEGATIVE mg/dL
Ketones, ur: NEGATIVE mg/dL
Nitrite: NEGATIVE
Nitrite: NEGATIVE
Protein, ur: 100 mg/dL — AB
Protein, ur: 30 mg/dL — AB
RBC / HPF: >50 RBC/hpf — ABNORMAL HIGH (ref 0–5)
Specific Gravity, Urine: 1.012 (ref 1.005–1.030)
Specific Gravity, Urine: 1.005 (ref 1.005–1.030)
WBC, UA: >50 WBC/hpf — ABNORMAL HIGH (ref 0–5)
pH: 7 (ref 5.0–8.0)
pH: 6 (ref 5.0–8.0)

## 2019-12-03 LAB — GLUCOSE, CAPILLARY
Glucose-Capillary: 153 mg/dL — ABNORMAL HIGH (ref 70–99)
Glucose-Capillary: 189 mg/dL — ABNORMAL HIGH (ref 70–99)
Glucose-Capillary: 169 mg/dL — ABNORMAL HIGH (ref 70–99)
Glucose-Capillary: 170 mg/dL — ABNORMAL HIGH (ref 70–99)

## 2019-12-03 LAB — MAGNESIUM: Magnesium: 1.7 mg/dL (ref 1.7–2.4)

## 2019-12-03 LAB — SODIUM, URINE, RANDOM: Sodium, Ur: 102 mmol/L

## 2019-12-03 LAB — OSMOLALITY, URINE: Osmolality, Ur: 410 mOsm/kg (ref 300–900)

## 2019-12-03 LAB — PHOSPHORUS: Phosphorus: 5.6 mg/dL — ABNORMAL HIGH (ref 2.5–4.6)

## 2019-12-03 IMAGING — US US RENAL
1 series · 14 of 21 positions shown · non-contrast
Comparison: [DATE] US , CT [DATE]

CLINICAL DATA: Acute kidney injury

EXAM:
RENAL / URINARY TRACT ULTRASOUND COMPLETE

[Series 1: us renal · 14 of 21 slices shown]
[im 1/21]
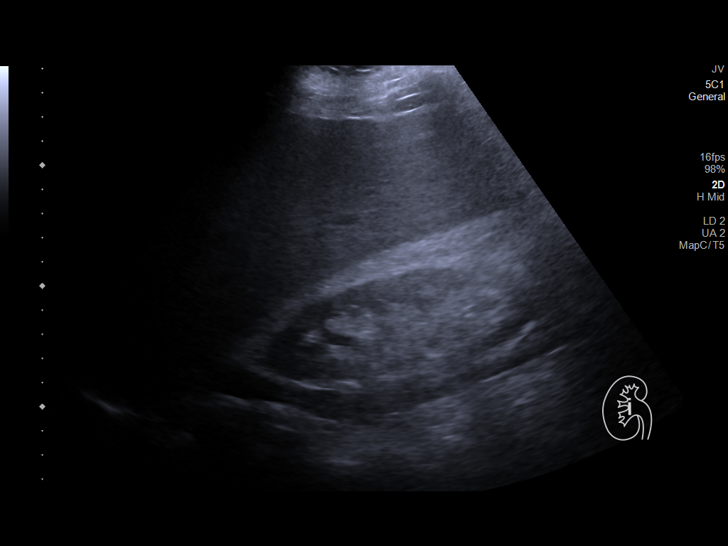
[im 3/21]
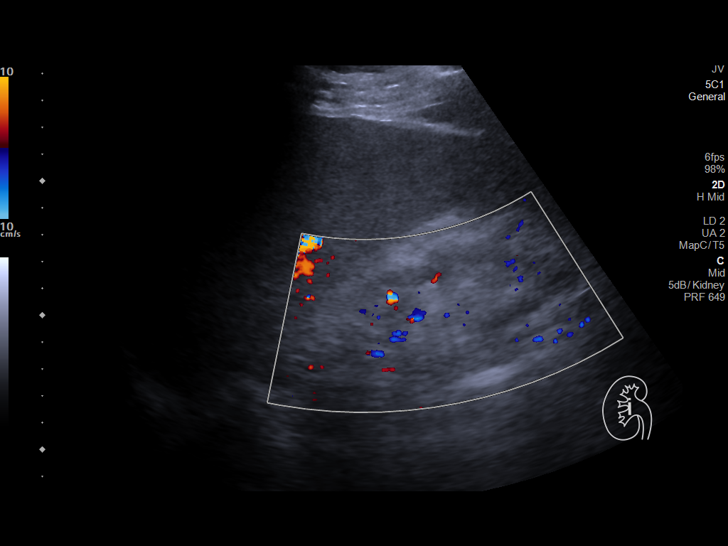
[im 4/21]
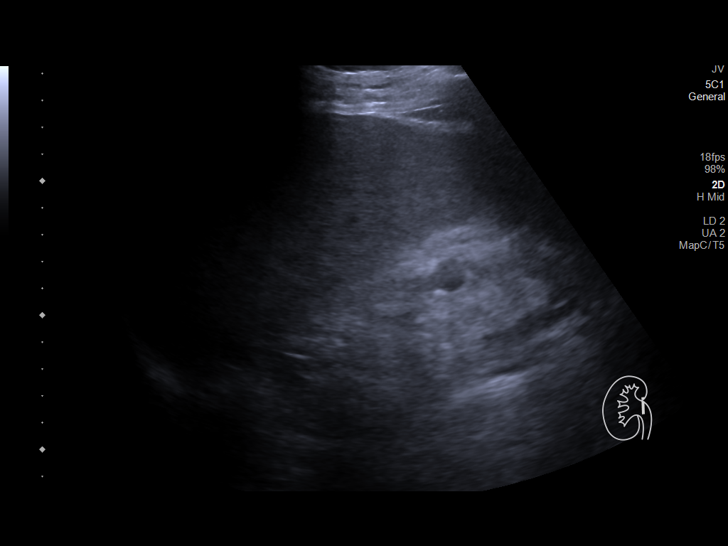
[im 6/21]
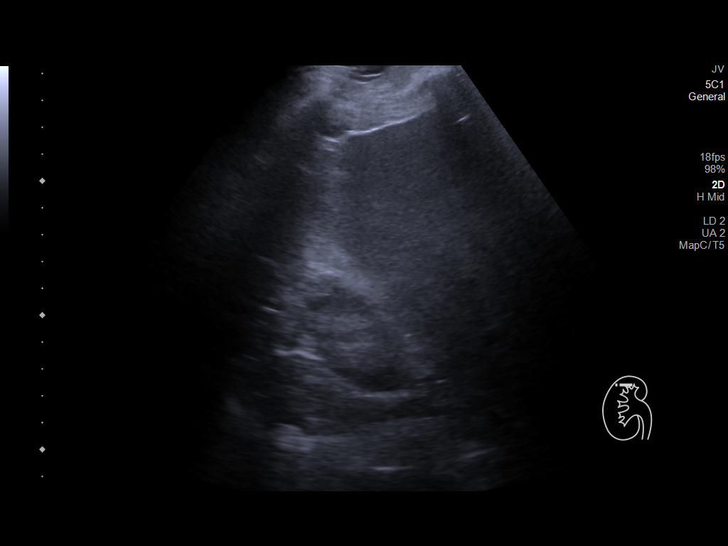
[im 7/21]
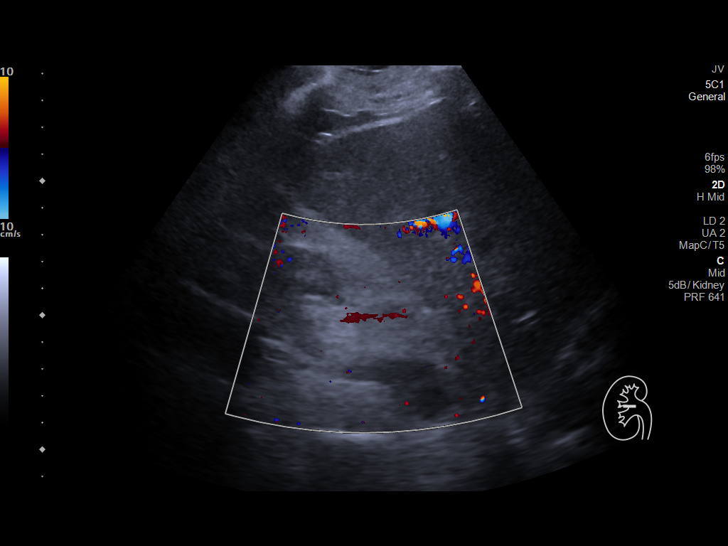
[im 9/21]
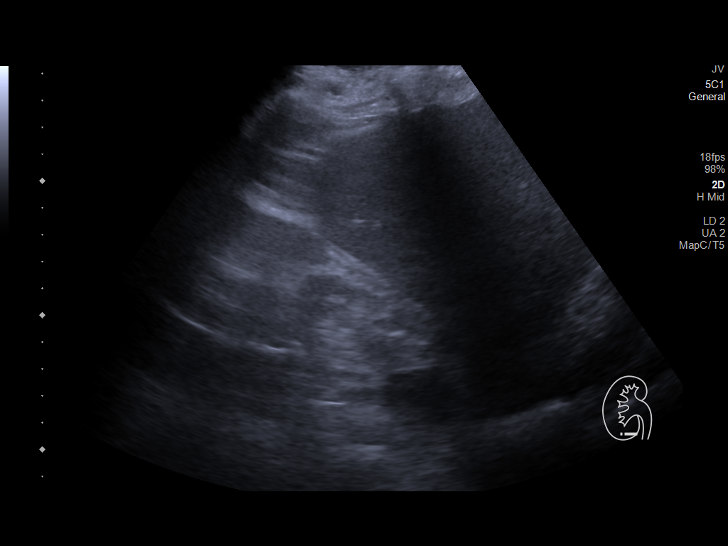
[im 10/21]
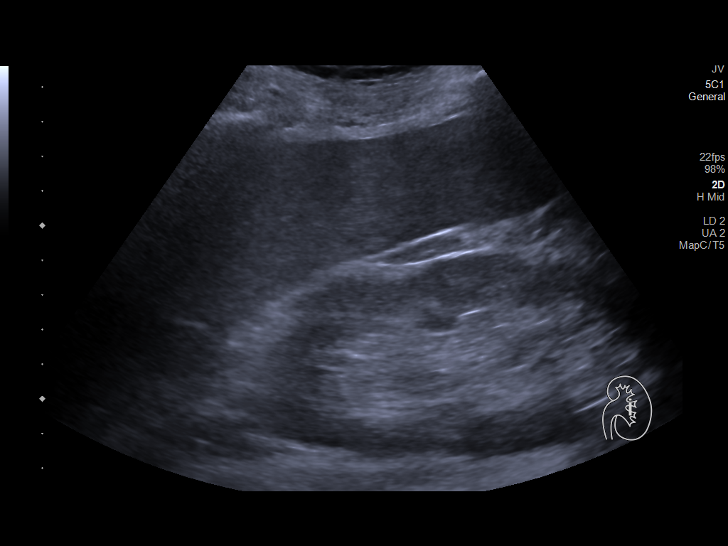
[im 12/21]
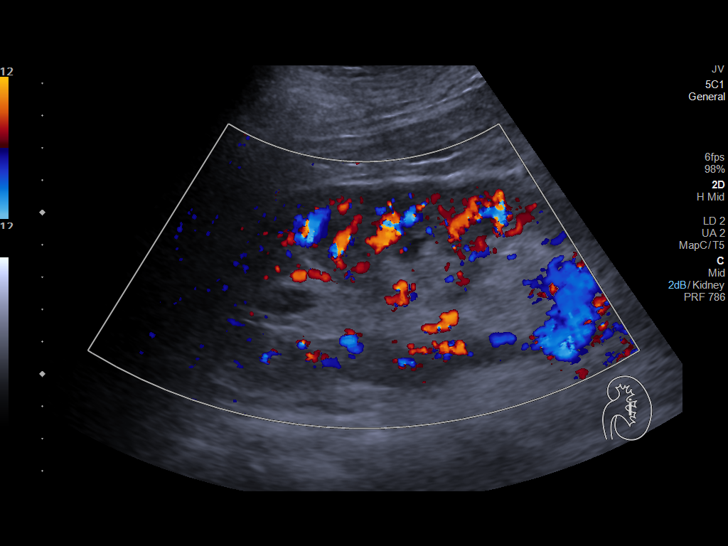
[im 13/21]
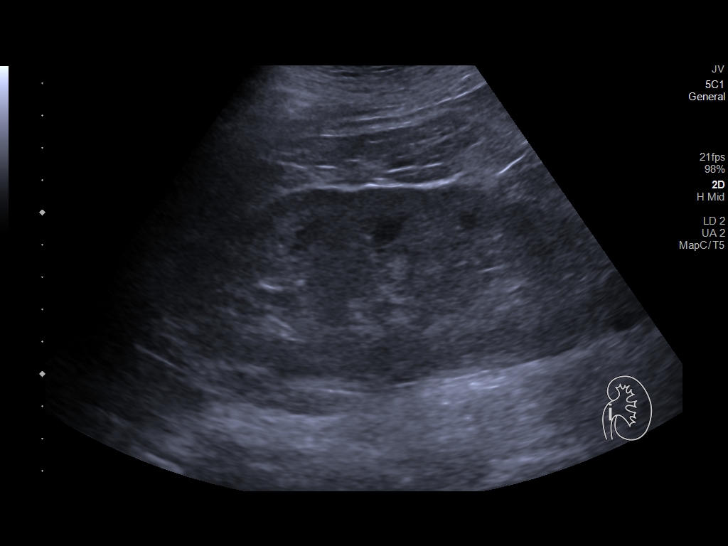
[im 15/21]
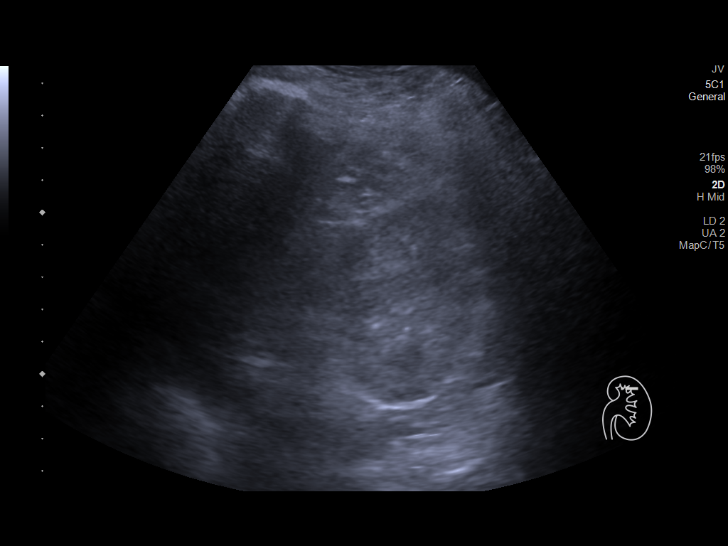
[im 16/21]
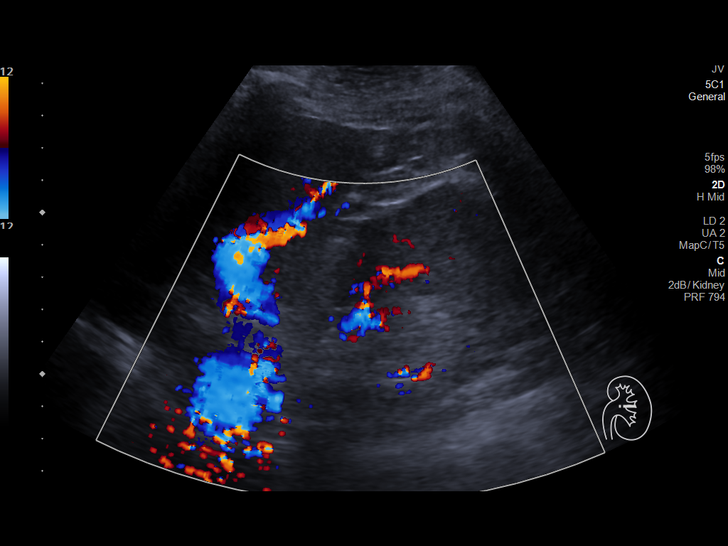
[im 18/21]
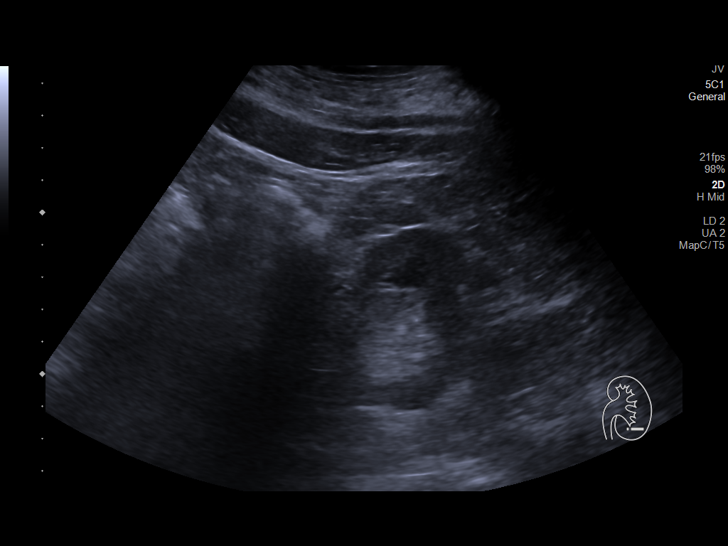
[im 19/21]
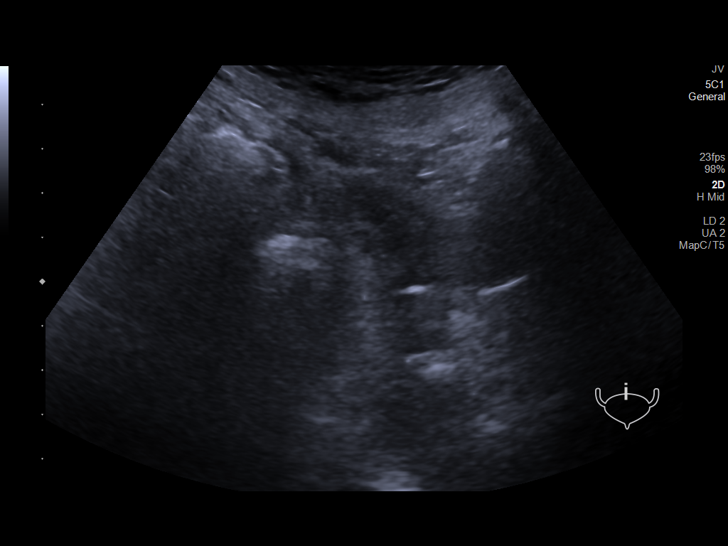
[im 21/21]
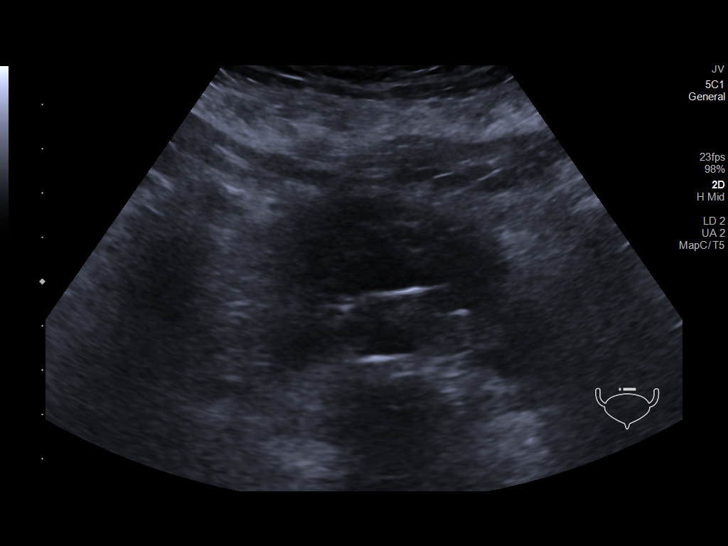

[14 of 21 positions shown; findings below may reference images not displayed]

FINDINGS: Right Kidney:

Renal measurements: 10.5 x 4.1 x 4.3 cm = volume: 94.9 mL. Cortical
echogenicity appears slightly increased. Renal cortical thinning is
present. No hydronephrosis

Left Kidney:

Renal measurements: 14.9 x 5.8 x 5.5 cm = volume: 249.9 mL. Cortical
echogenicity appears slightly increased. No hydronephrosis.

Bladder:

Foley catheter in the bladder which is empty.

Other:

None.
IMPRESSION: 1. Negative for hydronephrosis
2. Right kidney smaller than the left with diffuse cortical
thinning, consistent with atrophy.
3. Suggestion of increased cortical echogenicity as may be seen with
medical renal disease

## 2019-12-03 MED ORDER — CHLORHEXIDINE GLUCONATE CLOTH 2 % EX PADS
6.0000 | MEDICATED_PAD | Freq: Every day | CUTANEOUS | Status: DC
  Administered 2019-12-03 – 2019-12-16 (×13): 6 via TOPICAL

## 2019-12-03 MED ORDER — LIDOCAINE-PRILOCAINE 2.5-2.5 % EX CREA
TOPICAL_CREAM | Freq: Every day | CUTANEOUS | Status: DC
  Filled 2019-12-03 (×8): qty 5

## 2019-12-03 NOTE — Plan of Care (Signed)
  Problem: Nutrition: Goal: Adequate nutrition will be maintained Outcome: Progressing   Problem: Skin Integrity: Goal: Risk for impaired skin integrity will decrease Outcome: Progressing   Problem: Elimination: Goal: Will not experience complications related to bowel motility Outcome: Progressing Goal: Will not experience complications related to urinary retention Outcome: Progressing

## 2019-12-03 NOTE — Progress Notes (Signed)
PROGRESS NOTE    [name]  [id number] DOB: Mar 14, 1951 DOA: 11/24/2019 PCP: Thomas Jewel, MD   Brief Narrative:  The patient is a 69 y.o.malewith h/oCVA, HTN, hyperlipidemia,Gout,DM, CBP,PAD,s/pright BKA, chronic anemia, who was admitted on 1/31 to [facility] a fall with inability to get up on his own- prolonged down time. He was found to have a LLE wound/cellulitis with concern for outpatient treatment failure with Clindamycin. He was treated with IV Dapto with plan to transition to Linezolid at d/c (due to h/o MRSA bacteremia/diskitis in Nov 2020). On the evening of 2/3, he developed abdominal pain with n/v with coffee ground emesis that was gastroccult positive and so he was transferred to [facility] for GI evaluation. Patient also c/o severe back pain. Found to have epidural abscess likely from his previous MRSA infection.Underwent IR guided drainage.Currently plan isIV Daptomycin prolonged abx treatment.  Was still complaining of some right-sided flank and abdominal pain so we will obtain a CT of the abdomen pelvis but will have to do it without contrast difficult given his worsening renal function.  CT of the abdomen pelvis without contrast showed persistent destructive changes from T10/T11 discitis and osteomyelitis but no other acute intra-abdominal and intrapelvic processes and did mention that the bladder was unremarkable and adrenals were normal.  We have restarted IV fluids with on him currently as well but these have now stopped.  Still complaining of some pain and states he had another weird reaction to his daptomycin.  His renal function started worsening so we will obtain a urinalysis, urine culture, renal ultrasound as well as a bladder scan.  Bladder scan showed is retaining and nursing reported that he had thick milky colored urine coming out so urinalysis still pending.  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  -He was treated with IV Daptomycin as inpatient and transitioned to [place] at the time of d/c to home.  -He was discharged with home health. -MRI this admission shows evidence of epidural abscess. Neuro surgery, Infectious disease consulted.  -Now S/PIR guided drainage of the epidural abscess (on 11/26/2019, cx -ve so far). -Started back on IV daptomycin 2/6 after discussion with ID and will continue and CK was 38 and CRP was 8.3; infectious diseases recommends 8 weeks of IV daptomycin and have the patient arrange for hospital follow-up -Appreciate their f/u--they recommend central line and transition to OPAT when stable for discharge (when off IV pain meds) and Discharge Disposition secure (CIR vs. SNF) -OOB to Chair if able -Continues to have significant pain and complaining of right-sided abdominal pain flank pain so obtain a CT of the abdomen pelvis with oral contrast -CT of the abdomen pelvis without contrast showed persistent destructive changes from T10/T11 discitis and osteomyelitis but no other acute intra-abdominal and intrapelvic processes and did mention that the bladder was unremarkable and adrenals were normal. -Continues to complain about a weird sensation after he receives daptomycin and will need to discuss this with ID and  Dr. Megan Salon will be by to evaluate him in the AM   Hematemesis Macrocytic Anemia/Anemia of Chronic Kidney Disease  -H&H remained stable and went from 9.1/28.2 -> 8.9/27.5 -> 8.9/27.6 -> 8.6/27.7 -SP upper GI endoscopy by Dr.  Benson [place]. -No evidence of acute abnormality. -Continue PPI patient is back on regular diet. Avoid NSAIDs. -DVT prophylaxis resumed today with heparin  Chronic Pain Syndrome -Patient is on chronic oxycodone 30 mg 3 times daily as well as Tizanidine 2 m po q8h -Now, additionally, he is on Oxycodone 10 mg every 4 hours PRN and Dilaudid 1 mg IV  q6hfor severe pain -Added Lidocaine patch- claims does not help -Watch for drug seeking behavior and Judicious Use of IV Narcotics -Check CT of the Abd/Pelvis  -Continues to complain of some pain    BPH Acute urinary tension -Continue Tamsulosin 0.4 mg po Daily and Finasteride 5 mg po Daily  -Patient was retaining urine so Foley catheter was placed and drained 775 mL immediately -Bladder scan showed the patient has significant amount of urinary retention  Acute Kidney Injury on Chronic kidney disease stage IV, worsening  Hyperphosphatemia -Renal function started worsening and BUN/Cr now went from 30/2.71 -> 33/2.96 -> 37/3.20 -Patient's Phos Level now worsened too from 4.8 -> 5.7 -> 5.6 -Avoid nephrotoxic medications, contrast dyes, hypotension and renally adjust medications -Patient's BUN/creatinine had improved from admission down to 28/2.26 and is now is as above -Started IVF hydration with NS at 75 mL/hr but now stopped -Check U/A, Urine Cx, Renal U/S -Checked Bladder Scan and he was significantly retaining  -UA showed Turbid Appearance with Small Blood, Large Leukocytes, No Bacteria, 21-50 RBC/HPF, and >50 WBC -Urine Chloride was 84, Urine Osm was 410, and Urine Sodium was 102 -Await Urine Cx Results to see if he has any GNR -Have Asked ID to weigh in on his Urinalysis as well  -Avoid further nephrotoxic medications, contrast dyes, hypotension and renally dose medications -Continue monitor and trend renal function -Repeat CMP in a.m.  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 -Continue home regimen with Amlodipine 10 mg po Daily, Clonidine 0.3 mg TID, Finasteride 5 mg po Daily, Hydralazine 50 mg po q8h, and Metoprolol Tartrate 50 mg po BID  -C/w Labetalol 10 mg q2hprn For SBP>160 -Patient's BP was 135/67  Left heel and foot unstageable ulcer. -POA.Dressing changes per wound care -S/p right BKA -Left ABI pending (ordered 2/4) and done yesterday AM and showed "Left: Resting left ankle-brachial index indicates noncompressible left lower extremity arteries." -Currently on IV antibiotics with Daptomycin. -Can follow up with Vascular in the outpatient setting   History of Gout -Continue Allopurinol 100 mg po BID   Hypothyroidism -C/w Levothyroxine 50 mcg po Daily   DVT prophylaxis: Heparin 5,000 units sq q8h  Code Status: FULL CODE Family Communication: No family present at bedside  Disposition Plan: CIR vs. SNF patient is able to be weaned off of IV narcotics.  PT OT recommending CIR but currently not a candidate at this time as he is not meeting the needs per CIR coordinator.  Will need central access for long-term antibiotic usage for 8 weeks and go to SNF; Currently Renal Fxn has worsened and complaining of worsening Right Sided Abdominal/Flank Pain  Consultants:   Gastroenterology  Infectious Diseases    Procedures: None  Antimicrobials:  Anti-infectives (From admission, onward)   Start     Dose/Rate Route Frequency Ordered Stop   11/26/19 1730  DAPTOmycin (CUBICIN) 1,000 mg in sodium chloride 0.9 % IVPB     1,000 mg 240 mL/hr over 30 Minutes Intravenous Every 24 hours 11/26/19 1642       Subjective: Seen and examined at bedside and states that he had the same sensation of the daptomycin and felt like "the MRSA" and all his entire body".  Still complaining  of some right-sided pain.  No nausea or vomiting.  He was concerned that he is not getting his pain medications on time.  No other concerns or complaints at this time.  Objective: Vitals:   12/03/19 0500 12/03/19 0858 12/03/19 1247 12/03/19 1701  BP: (!) 148/78 136/83 130/71 135/67  Pulse:  61 63 65  Resp:  14  16  Temp: 98.1 F (36.7 C) 98.1 F (36.7 C)  98.1 F (36.7 C)  TempSrc: Oral Oral  Oral  SpO2: 99% 97%  96%  Weight:      Height:        Intake/Output Summary (Last 24 hours) at 12/03/2019 1707 Last data filed at 12/03/2019 1654 Gross per 24 hour  Intake 0 ml  Output 1950 ml  Net -1950 ml   Filed Weights   11/30/19 2110 12/01/19 2100 12/02/19 1900  Weight: 111.6 kg 111.4 kg 111.2 kg   Examination: Physical Exam:  Constitutional: WN/WD overweight Caucasian male who is older appearing than his stated age and appears somewhat uncomfortable and mildly anxious  ENMT: External Ears, Nose appear normal. Grossly normal hearing.  Neck: Appears normal, supple, no cervical masses, normal ROM, no appreciable thyromegaly; no JVD Respiratory: Slightly diminished to auscultation bilaterally, no wheezing, rales, rhonchi or crackles. Normal respiratory effort and patient is not tachypenic. No accessory muscle use.  Unlabored breathing Cardiovascular: RRR, no murmurs / rubs / gallops. S1 and S2 auscultated.  Left leg is wrapped and he has a right BKA Abdomen: Soft, continues to tender on palpation, distended secondary body habitus. Bowel sounds positive x4.  GU: Deferred. Musculoskeletal: No clubbing / cyanosis of digits/nails.  Has a right BKA Skin: No rashes, lesions on limited skin evaluation but is lower extremity left leg is wrapped; no induration; Warm and dry.  Neurologic: CN 2-12 grossly intact with no focal deficits. Romberg sign and cerebellar reflexes not assessed.  Psychiatric: Normal judgment and insight. Alert and oriented x 3.  Slightly anxious mood and appropriate affect.    Data Reviewed: I have personally reviewed following labs and imaging studies  CBC: Recent Labs  Lab 11/29/19 0557 11/30/19 0539 12/01/19 0441 12/02/19 0353 12/03/19 0733  WBC 9.2 6.7 7.7 7.6 8.0  NEUTROABS  --   --  5.1 4.8 5.6  HGB 8.9* 9.1* 8.9* 8.9* 8.6*  HCT 28.0* 28.2* 27.5* 27.6* 27.7*  MCV 98.9 97.2 96.2 98.2 100.4*  PLT 177 182 192 223 [id number]   Basic Metabolic Panel: Recent Labs  Lab 11/28/19 0505 11/30/19 0824 12/01/19 0441 12/02/19 0353 12/03/19 0733  NA 141 138 139 137 137  K 3.7 4.1 4.1 4.3 4.8  CL 107 102 100 103 105  CO2 25 27 24 23 23   GLUCOSE 157* 167* 164* 178* 167*  BUN 28* 25* 30* 33* 37*  CREATININE 2.26* 2.40* 2.71* 2.96* 3.20*  CALCIUM 8.5* 8.7* 8.9 8.5* 8.7*  MG 1.7 1.7 1.7 1.8 1.7  PHOS  --  4.6 4.8* 5.7* 5.6*   GFR:  Estimated Creatinine Clearance: 31.9 mL/min (A) (by C-G formula based on SCr of 3.2 mg/dL (H)). Liver Function Tests: Recent Labs  Lab 11/30/19 0824 12/01/19 0441 12/02/19 0353 12/03/19 0733  AST 22 23 21 18   ALT 13 13 14 13   ALKPHOS 80 73 74 75  BILITOT 0.4 0.2* 0.5 0.2*  PROT 6.9 6.7 6.7 6.7  ALBUMIN 2.8* 2.7* 2.7* 2.6*   No results for input(s): LIPASE, AMYLASE in the last 168 hours. No results for input(s): AMMONIA in the last 168 hours. Coagulation Profile: No results for input(s): INR, PROTIME in the last 168 hours. Cardiac Enzymes: Recent Labs  Lab 11/28/19 0505  CKTOTAL 38*   BNP (last 3 results) No results for input(s): PROBNP in the last 8760 hours. HbA1C: No results for input(s): HGBA1C in the last 72 hours. CBG: Recent Labs  Lab 12/02/19 1634 12/02/19 2048 12/03/19 0653 12/03/19 1125 12/03/19 1657  GLUCAP 127* 213* 153* 189* 169*   Lipid Profile: No results for input(s): CHOL, HDL, LDLCALC, TRIG, CHOLHDL, LDLDIRECT in the last 72 hours. Thyroid Function Tests: No results for input(s): TSH, T4TOTAL, FREET4, T3FREE, THYROIDAB in the last 72 hours. Anemia Panel: No results for input(s):  VITAMINB12, FOLATE, FERRITIN, TIBC, IRON, RETICCTPCT in the last 72 hours. Sepsis Labs: No results for input(s): PROCALCITON, LATICACIDVEN in the last 168 hours.  Recent Results (from the past 240 hour(s))  Blood Cultures x 2 sites     Status: None   Collection Time: 11/24/19 10:50 AM   Specimen: BLOOD LEFT HAND  Result Value Ref Range Status   Specimen Description BLOOD LEFT HAND  Final   Special Requests   Final    BOTTLES DRAWN AEROBIC AND ANAEROBIC Blood Culture results may not be optimal due to an inadequate volume of blood received in culture bottles   Culture   Final    NO GROWTH 5 DAYS Performed at [facility], [place] [address] [place], [place] [address]    Report Status 11/29/2019 FINAL  Final  Blood Cultures x 2 sites     Status: None   Collection Time: 11/24/19 10:55 AM   Specimen: BLOOD RIGHT HAND  Result Value Ref Range Status   Specimen Description BLOOD RIGHT HAND  Final   Special Requests   Final    BOTTLES DRAWN AEROBIC AND ANAEROBIC Blood Culture adequate volume   Culture   Final    NO GROWTH 5 DAYS Performed at [facility], [place] [address] [place], [place] [address]    Report Status 11/29/2019 FINAL  Final  MRSA PCR Screening     Status: None   Collection Time: 11/24/19  2:59 PM   Specimen: Nasopharyngeal  Result Value Ref Range Status   MRSA by PCR NEGATIVE NEGATIVE Final    Comment:        The GeneXpert MRSA Assay (FDA approved for NASAL specimens only), is one component of a comprehensive MRSA colonization surveillance program. It is not intended to diagnose MRSA infection nor to guide or monitor treatment for MRSA infections. Performed at [facility], [place] [address] [place], [place] [address]   Aerobic/Anaerobic Culture (surgical/deep wound)     Status: None   Collection Time: 11/26/19  2:41 PM   Specimen: NEEDLE ASPIRATE  Result Value Ref Range Status   Specimen Description NEEDLE ASPIRATE DISC  Final   Special Requests  NONE  Final   Gram Stain NO WBC SEEN NO ORGANISMS SEEN   Final  Culture   Final    No growth aerobically or anaerobically. Performed at [facility], [place] [address], [place], [place] [address]    Report Status 12/01/2019 FINAL  Final     RN Pressure Injury Documentation: Pressure Injury 11/24/19 Heel Left Unstageable - Full thickness tissue loss in which the base of the injury is covered by slough (yellow, tan, gray, green or brown) and/or eschar (tan, brown or black) in the wound bed. (Active)  11/24/19 0655  Location: Heel  Location Orientation: Left  Staging: Unstageable - Full thickness tissue loss in which the base of the injury is covered by slough (yellow, tan, gray, green or brown) and/or eschar (tan, brown or black) in the wound bed.  Wound Description (Comments):   Present on Admission: Yes     Pressure Injury 09/02/19 Foot Left Unstageable - Full thickness tissue loss in which the base of the ulcer is covered by slough (yellow, tan, gray, green or brown) and/or eschar (tan, brown or black) in the wound bed. (Active)  09/02/19 0815  Location: Foot  Location Orientation: Left  Staging: Unstageable - Full thickness tissue loss in which the base of the ulcer is covered by slough (yellow, tan, gray, green or brown) and/or eschar (tan, brown or black) in the wound bed.  Wound Description (Comments):   Present on Admission: Yes     Estimated body mass index is 28.33 kg/m as calculated from the following:   Height as of this encounter: 6\' 6"  (1.981 m).   Weight as of this encounter: 111.2 kg.  Malnutrition Type:  Nutrition Problem: Increased nutrient needs Etiology: wound healing   Malnutrition Characteristics:  Signs/Symptoms: estimated needs   Nutrition Interventions:  Interventions: Boost Breeze, Juven   Radiology Studies: CT ABDOMEN PELVIS WO CONTRAST  Result Date: 12/02/2019 CLINICAL DATA:  Abdominal pain, fever, vertebral osteomyelitis and discitis  EXAM: CT ABDOMEN AND PELVIS WITHOUT CONTRAST TECHNIQUE: Multidetector CT imaging of the abdomen and pelvis was performed following the standard protocol without IV contrast. COMPARISON:  11/23/2019, 11/25/2019, levin 620 FINDINGS: Lower chest: No acute pleural or parenchymal lung disease. Hepatobiliary: No focal liver abnormality is seen. No gallstones, gallbladder wall thickening, or biliary dilatation. Pancreas: Unremarkable. No pancreatic ductal dilatation or surrounding inflammatory changes. Spleen: Normal in size without focal abnormality. Adrenals/Urinary Tract: There is chronic right renal atrophy. No urinary tract calculi or obstructive uropathy. Bladder is unremarkable. Adrenals are normal. Stomach/Bowel: No bowel obstruction or ileus. Normal appendix right lower quadrant. No inflammatory changes. Vascular/Lymphatic: IVC filter is noted. Minimal atherosclerosis of the aorta. No pathologic adenopathy. Reproductive: Prostate is unremarkable. Other: No abdominal wall hernia or abnormality. No abdominopelvic ascites. Musculoskeletal: Continued destructive changes at the T10/T11 disc space consistent with diskitis and vertebral osteomyelitis. Paraspinal phlegmon is noted. IMPRESSION: 1. Persistent destructive changes from T10/T11 discitis and osteomyelitis. 2. Otherwise no acute intra-abdominal or intrapelvic process. Electronically Signed   By: Randa Ngo M.D.   On: 12/02/2019 19:36    Scheduled Meds: . allopurinol  100 mg Oral BID  . amLODipine  10 mg Oral Daily  . Chlorhexidine Gluconate Cloth  6 each Topical Daily  . cloNIDine  0.3 mg Oral TID  . feeding supplement (ENSURE ENLIVE)  237 mL Oral BID BM  . finasteride  5 mg Oral Daily  . heparin injection (subcutaneous)  5,000 Units Subcutaneous Q8H  . hydrALAZINE  50 mg Oral Q8H  . insulin aspart  0-15 Units Subcutaneous TID WC  . insulin aspart  0-5 Units Subcutaneous QHS  . levothyroxine  50 mcg Oral Q0600  . lidocaine  2 patch Transdermal  Q24H  . lidocaine-prilocaine   Topical Daily  . metoprolol tartrate  50 mg Oral BID  . nutrition supplement (JUVEN)  1 packet Oral BID BM  . oxyCODONE  30 mg Oral TID  . pantoprazole  40 mg Oral Daily  . sodium chloride flush  3 mL Intravenous Q12H  . tamsulosin  0.4 mg Oral Daily   Continuous Infusions: . sodium chloride Stopped (11/27/19 2024)  . DAPTOmycin (CUBICIN)  IV 1,000 mg (12/02/19 2050)    LOS: 9 days   Kerney Elbe, DO [facility] PAGER is on [place]  If 7PM-7AM, please contact night-coverage [email or web address]

## 2019-12-04 ENCOUNTER — Other Ambulatory Visit: Payer: Self-pay

## 2019-12-04 ENCOUNTER — Encounter (HOSPITAL_COMMUNITY): Payer: Self-pay | Admitting: Internal Medicine

## 2019-12-04 DIAGNOSIS — R339 Retention of urine, unspecified: Secondary | ICD-10-CM | POA: Diagnosis not present

## 2019-12-04 DIAGNOSIS — R3989 Other symptoms and signs involving the genitourinary system: Secondary | ICD-10-CM

## 2019-12-04 LAB — CBC WITH DIFFERENTIAL/PLATELET
Abs Immature Granulocytes: 0.05 10*3/uL (ref 0.00–0.07)
Basophils Absolute: 0 10*3/uL (ref 0.0–0.1)
Basophils Relative: 0 %
Eosinophils Absolute: 0.4 10*3/uL (ref 0.0–0.5)
Eosinophils Relative: 6 %
HCT: 29.8 % — ABNORMAL LOW (ref 39.0–52.0)
Hemoglobin: 9.4 g/dL — ABNORMAL LOW (ref 13.0–17.0)
Immature Granulocytes: 1 %
Lymphocytes Relative: 19 %
Lymphs Abs: 1.3 10*3/uL (ref 0.7–4.0)
MCH: 31 pg (ref 26.0–34.0)
MCHC: 31.5 g/dL (ref 30.0–36.0)
MCV: 98.3 fL (ref 80.0–100.0)
Monocytes Absolute: 0.4 10*3/uL (ref 0.1–1.0)
Monocytes Relative: 7 %
Neutro Abs: 4.5 10*3/uL (ref 1.7–7.7)
Neutrophils Relative %: 67 %
Platelets: 235 10*3/uL (ref 150–400)
RBC: 3.03 MIL/uL — ABNORMAL LOW (ref 4.22–5.81)
RDW: 14.2 % (ref 11.5–15.5)
WBC: 6.6 10*3/uL (ref 4.0–10.5)
nRBC: 0 % (ref 0.0–0.2)

## 2019-12-04 LAB — COMPREHENSIVE METABOLIC PANEL
ALT: 12 U/L (ref 0–44)
AST: 19 U/L (ref 15–41)
Albumin: 2.6 g/dL — ABNORMAL LOW (ref 3.5–5.0)
Alkaline Phosphatase: 77 U/L (ref 38–126)
Anion gap: 10 (ref 5–15)
BUN: 38 mg/dL — ABNORMAL HIGH (ref 8–23)
CO2: 22 mmol/L (ref 22–32)
Calcium: 8.6 mg/dL — ABNORMAL LOW (ref 8.9–10.3)
Chloride: 105 mmol/L (ref 98–111)
Creatinine, Ser: 3.34 mg/dL — ABNORMAL HIGH (ref 0.61–1.24)
GFR calc Af Amer: 21 mL/min — ABNORMAL LOW (ref >60)
GFR calc non Af Amer: 18 mL/min — ABNORMAL LOW (ref >60)
Glucose, Bld: 213 mg/dL — ABNORMAL HIGH (ref 70–99)
Potassium: 5.1 mmol/L (ref 3.5–5.1)
Sodium: 137 mmol/L (ref 135–145)
Total Bilirubin: 0.5 mg/dL (ref 0.3–1.2)
Total Protein: 6.8 g/dL (ref 6.5–8.1)

## 2019-12-04 LAB — MAGNESIUM: Magnesium: 1.8 mg/dL (ref 1.7–2.4)

## 2019-12-04 LAB — GLUCOSE, CAPILLARY
Glucose-Capillary: 154 mg/dL — ABNORMAL HIGH (ref 70–99)
Glucose-Capillary: 179 mg/dL — ABNORMAL HIGH (ref 70–99)
Glucose-Capillary: 123 mg/dL — ABNORMAL HIGH (ref 70–99)
Glucose-Capillary: 148 mg/dL — ABNORMAL HIGH (ref 70–99)

## 2019-12-04 LAB — CREATININE, URINE, RANDOM: Creatinine, Urine: 59.06 mg/dL

## 2019-12-04 LAB — URINE CULTURE

## 2019-12-04 LAB — PHOSPHORUS: Phosphorus: 4.8 mg/dL — ABNORMAL HIGH (ref 2.5–4.6)

## 2019-12-04 MED ORDER — SODIUM CHLORIDE 0.9 % IV SOLN: INTRAVENOUS | Status: DC

## 2019-12-04 MED ORDER — SODIUM CHLORIDE 0.9 % IV SOLN
1000.0000 mg | INTRAVENOUS | Status: DC
  Administered 2019-12-05: 1000 mg via INTRAVENOUS
  Filled 2019-12-04: qty 20

## 2019-12-04 NOTE — Plan of Care (Signed)
  Problem: Activity: Goal: Risk for activity intolerance will decrease Outcome: Progressing   Problem: Skin Integrity: Goal: Risk for impaired skin integrity will decrease Outcome: Progressing   

## 2019-12-04 NOTE — Progress Notes (Signed)
Patient ID: Thomas [name], male   DOB: 09-Jan-1951, 69 y.o.   MRN: [id number]         [facility]  Date of Admission:  11/24/2019           Day 9 Daptomycin ASSESSMENT: I am not sure what is causing the sensation he describes while receiving daptomycin but I do not think it is a reason to switch to an alternate antibiotic.  He does have deteriorating renal function because of his urinary retention so I will change to every other day dosing of daptomycin.  I do believe that he is getting better.  Although he says he is still having severe back pain moves his legs freely without pain.  He does have a markedly abnormal urinalysis but I am not at fully convinced that he has a symptomatic UTI at this time.  I will hold off on adding broader antibiotic therapy pending further observation and urine culture results.  PLAN: 1. Change daptomycin to every other day dosing for now 2. Monitor renal function closely 3. Await urine culture results  Principal Problem:   Thoracic discitis Active Problems:   Epidural abscess   [name], type 2) (HCC)   Gout   HTN (hypertension)   Pressure injury of skin   Upper GI bleeding   BPH (benign prostatic hyperplasia)   CKD (chronic kidney disease), stage IV (HCC)   Chronic pain syndrome   Physical deconditioning   Urinary retention   Scheduled Meds: . allopurinol  100 mg Oral BID  . amLODipine  10 mg Oral Daily  . Chlorhexidine Gluconate Cloth  6 each Topical Daily  . cloNIDine  0.3 mg Oral TID  . feeding supplement (ENSURE ENLIVE)  237 mL Oral BID BM  . finasteride  5 mg Oral Daily  . heparin injection (subcutaneous)  5,000 Units Subcutaneous Q8H  . hydrALAZINE  50 mg Oral Q8H  . insulin aspart  0-15 Units Subcutaneous TID WC  . insulin aspart  0-5 Units Subcutaneous QHS  . levothyroxine  50 mcg Oral Q0600  . lidocaine  2 patch Transdermal Q24H  . lidocaine-prilocaine   Topical Daily  . metoprolol  tartrate  50 mg Oral BID  . nutrition supplement (JUVEN)  1 packet Oral BID BM  . oxyCODONE  30 mg Oral TID  . pantoprazole  40 mg Oral Daily  . sodium chloride flush  3 mL Intravenous Q12H  . tamsulosin  0.4 mg Oral Daily   Continuous Infusions: . sodium chloride Stopped (11/27/19 2024)  . DAPTOmycin (CUBICIN)  IV 1,000 mg (12/03/19 2000)   PRN Meds:.sodium chloride, acetaminophen **OR** acetaminophen, bisacodyl, HYDROmorphone (DILAUDID) injection, labetalol, ondansetron **OR** ondansetron (ZOFRAN) IV, oxyCODONE, tiZANidine   SUBJECTIVE: 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  He received 18 days of daptomycin before being discharged on weekly doses of dalbavancin.  He had improvement in his pain but developed severe worsening of his pain shortly after his last dose of dalbavancin leading to readmission.  MRI showed worsening of his thoracic inflammation.  He underwent repeat aspirate cultures were negative.  Blood cultures were also negative.  Daptomycin was restarted for presumed persistent MRSA infection.  He says that during his last 2 doses of daptomycin he has noticed a strange sensation that races up and down his chest and abdomen.  He says that it feels "like of the virus is being chased away".  He  recalls a similar sensation when he received daptomycin last fall.  He does not find the sensation to be especially distressing or painful.  He says he just wants to know if the medication is working.  He says that he can now lift both legs off the bed without difficulty, something he could not do when he was first admitted.  He is still having severe pain from his sternum to his pelvis.  He has chronic kidney disease and has developed urinary retention.  His nurse noted that he was having cloudy urine prior to having a Foley catheter placed yesterday.  He denies having any recent dysuria.  He does have pyuria and microscopic  hematuria on urinalysis.  Urine culture is pending.  He has not had any fever, chills or sweats.  His creatinine has continued to rise recently.  Review of Systems: Review of Systems  Constitutional: Positive for malaise/fatigue. Negative for chills, diaphoresis, fever and weight loss.  HENT: Negative for congestion and sore throat.   Respiratory: Negative for cough and shortness of breath.   Cardiovascular: Negative for chest pain.  Gastrointestinal: Negative for abdominal pain, diarrhea, nausea and vomiting.  Genitourinary: Negative for dysuria.       He was having urinary hesitancy prior to Foley placement.  Musculoskeletal: Positive for back pain and joint pain.  Neurological: Negative for headaches.    Allergies  Allergen Reactions  . Other Other (See Comments)    Blood pressure issues Per [facility] hospital: Pt states he can only take these pain meds or else he gets very sick: Oxycontin,morphine,demerol, and dilaudid are the only pain meds pt states he can take.    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 2.4.2021: He also reports that he currently take Oxycontin.   Marland Kitchen Oxymorphone Other (See Comments)    Causes kidney problems  . Aspirin Nausea And Vomiting    Per [facility]  . Beta Vulgaris Nausea And Vomiting    Beets  . Buspirone Other (See Comments)    unknown  . Cabbage Nausea And Vomiting  . Codeine Other (See Comments)    unknown  . Fish Allergy Nausea And Vomiting  . Fish-Derived Products Nausea And Vomiting  . Pentazocine Other (See Comments)    unknown  . Propoxyphene Other (See Comments)    unknown  . Shellfish Allergy Nausea And Vomiting  . Sulfa Antibiotics Hives  . Sulfasalazine Other (See Comments)  . Amoxicillin Rash    Per [facility]  . Apap [Acetaminophen] Rash    OBJECTIVE: Vitals:   12/03/19 1701 12/03/19 2104 12/04/19 0456 12/04/19 0930  BP: 135/67 (!)  151/76 (!) 145/79 115/65  Pulse: 65 64 63 (!) 55  Resp: 16 18 18 20   Temp: 98.1 F (36.7 C) 98.2 F (36.8 C) 98.4 F (36.9 C) 98.2 F (36.8 C)  TempSrc: Oral   Oral  SpO2: 96% 98% 97% 96%  Weight:  111.2 kg    Height:       Body mass index is 28.33 kg/m.  Physical Exam Constitutional:      Comments: He is in good spirits.  He is very talkative.  Cardiovascular:     Rate and Rhythm: Normal rate and regular rhythm.     Heart sounds: No murmur.  Pulmonary:     Effort: Pulmonary effort is normal.     Breath sounds: Normal breath sounds.  Abdominal:     Palpations:  Abdomen is soft.     Tenderness: There is no abdominal tenderness.  Genitourinary:    Comments: Foley catheter in place. Musculoskeletal:     Comments: He has had previous right BKA.  He can easily pick both legs up off the bed without difficulty or obvious pain.  Psychiatric:        Mood and Affect: Mood normal.     Lab Results Lab Results  Component Value Date   WBC 6.6 12/04/2019   HGB 9.4 (L) 12/04/2019   HCT 29.8 (L) 12/04/2019   MCV 98.3 12/04/2019   PLT 235 12/04/2019    Lab Results  Component Value Date   CREATININE 3.34 (H) 12/04/2019   BUN 38 (H) 12/04/2019   NA 137 12/04/2019   K 5.1 12/04/2019   CL 105 12/04/2019   CO2 22 12/04/2019    Lab Results  Component Value Date   ALT 12 12/04/2019   AST 19 12/04/2019   ALKPHOS 77 12/04/2019   BILITOT 0.5 12/04/2019    Sed Rate (mm/hr)  Date Value  11/24/2019 132 (H)  09/14/2019 132 (H)   CRP (mg/dL)  Date Value  11/24/2019 8.3 (H)  09/21/2019 4.4 (H)  09/14/2019 1.8 (H)    Microbiology: Recent Results (from the past 240 hour(s))  MRSA PCR Screening     Status: None   Collection Time: 11/24/19  2:59 PM   Specimen: Nasopharyngeal  Result Value Ref Range Status   MRSA by PCR NEGATIVE NEGATIVE Final    Comment:        The GeneXpert MRSA Assay (FDA approved for NASAL specimens only), is one component of a comprehensive MRSA  colonization surveillance program. It is not intended to diagnose MRSA infection nor to guide or monitor treatment for MRSA infections. Performed at [facility], [place] [address], [place], [place] [address]   Aerobic/Anaerobic Culture (surgical/deep wound)     Status: None   Collection Time: 11/26/19  2:41 PM   Specimen: NEEDLE ASPIRATE  Result Value Ref Range Status   Specimen Description NEEDLE ASPIRATE DISC  Final   Special Requests NONE  Final   Gram Stain NO WBC SEEN NO ORGANISMS SEEN   Final   Culture   Final    No growth aerobically or anaerobically. Performed at [facility], [place] [address], [place], [place] [address]    Report Status 12/01/2019 FINAL  Final    Michel Bickers, MD [facility] [place] [facility] [phone] pager   [phone] cell 12/04/2019, 2:44 PM

## 2019-12-04 NOTE — Progress Notes (Signed)
PROGRESS NOTE    Thomas [name]  [id number] DOB: Feb 14, 1951 DOA: 11/24/2019 PCP: Antonietta Jewel, MD   Brief Narrative:  The patient is a 69 y.o.malewith h/oCVA, HTN, hyperlipidemia,Gout,DM, CBP,PAD,s/pright BKA, chronic anemia, who was admitted on 1/31 to [facility] a fall with inability to get up on his own- prolonged down time. He was found to have a LLE wound/cellulitis with concern for outpatient treatment failure with Clindamycin. He was treated with IV Dapto with plan to transition to Linezolid at d/c (due to h/o MRSA bacteremia/diskitis in Nov 2020). On the evening of 2/3, he developed abdominal pain with n/v with coffee ground emesis that was gastroccult positive and so he was transferred to [facility] for GI evaluation. Patient also c/o severe back pain. Found to have epidural abscess likely from his previous MRSA infection.Underwent IR guided drainage.Currently plan isIV Daptomycin prolonged abx treatment.  Was still complaining of some right-sided flank and abdominal pain so we will obtain a CT of the abdomen pelvis but will have to do it without contrast difficult given his worsening renal function.  CT of the abdomen pelvis without contrast showed persistent destructive changes from T10/T11 discitis and osteomyelitis but no other acute intra-abdominal and intrapelvic processes and did mention that the bladder was unremarkable and adrenals were normal.  We had restarted IV fluids with on him currently as well but these have now stopped.  Still complaining of some pain and states he continues to have a weird reaction to his daptomycin.  His renal function started worsening so we will obtain a urinalysis, urine culture, renal ultrasound as well as a bladder scan.  Bladder scan showed is retaining and nursing reported that he had thick milky colored urine coming out so urinalysis still pending.  Renal ultrasound was ordered and showed "Negative for hydronephrosis. Right kidney smaller than  the left with diffuse cortical thinning, consistent with atrophy. Suggestion of increased cortical echogenicity as may be seen with medical renal disease."  The patient's renal function still slightly worsened so have consulted nephrology for further evaluation recommendations and ID is to see the patient again due to his concern about the feeling he gets when he takes the daptomycin.  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  -He was treated with IV Daptomycin as inpatient and transitioned to [place] at the time of d/c to home.  -He was discharged with home health. -MRI this admission shows evidence of epidural abscess. Neuro surgery, Infectious disease consulted.  -Now S/PIR guided drainage of the epidural abscess (on 11/26/2019, cx -ve so far). -Started back on IV daptomycin 2/6 after discussion with ID and will continue and CK was 38 and CRP was 8.3; infectious diseases recommends 8 weeks of IV daptomycin and have the patient arrange for hospital follow-up -Appreciate their f/u--they recommend central line and transition to OPAT when stable for discharge (when off IV pain meds) and Discharge Disposition secure (CIR vs. SNF) -OOB to Chair if able -Continues to have significant pain and complaining of right-sided abdominal pain  flank pain so obtain a CT of the abdomen pelvis with oral contrast -CT of the abdomen pelvis without contrast showed persistent destructive changes from T10/T11 discitis and osteomyelitis but no other acute  intra-abdominal and intrapelvic processes and did mention that the bladder was unremarkable and adrenals were normal. -Continues to complain about a weird sensation after he receives daptomycin and will need to discuss this with ID and Dr. Megan Salon will be by to evaluate him today to discuss this; patient is agreeable to start taking the daptomycin but just gets concerned with this reaction that he is having and he described it as "the MRSA bacteria hiding from the antibiotics".  Hematemesis Macrocytic Anemia/Anemia of Chronic Kidney Disease  -H&H remained stable and went from 9.1/28.2 -> 8.9/27.5 -> 8.9/27.6 -> 8.6/27.7 -> 9.4/29.8 -SP upper GI endoscopy by Dr. Benson Norway. -No evidence of acute abnormality. -Continue PPI patient is back on regular diet. Avoid NSAIDs. -DVT prophylaxis resumed today with heparin  Chronic Pain Syndrome -Patient is on chronic oxycodone 30 mg 3 times daily as well as Tizanidine 2 m po q8h -Now, additionally, he is on Oxycodone 10 mg every 4 hours PRN and Dilaudid 1 mg IV  q6hfor severe pain -Added Lidocaine patch and claims does not help; have added EMLA cream now -Watch for drug seeking behavior and Judicious Use of IV Narcotics -Checked CT of the Abd/Pelvis and as above -Continues to complain of some pain    BPH Acute Urinary Retention -Continue Tamsulosin 0.4 mg po Daily and Finasteride 5 mg po Daily  -Patient was retaining urine so Foley catheter was placed and drained 775 mL immediately -Bladder scan showed the patient has significant amount of urinary retention -Renal ultrasound done and showed "Negative for hydronephrosis. Right kidney smaller than the left with diffuse cortical thinning, consistent with atrophy. Suggestion of increased cortical echogenicity as may be seen with medical renal disease."  Suspected UTI -Initial UA showed Turbid Appearance with Small Blood, Large Leukocytes, No Bacteria, 21-50 RBC/HPF, and >50 WBC -Repeat after Foley was  placed showed cloudy appearance, moderate hemoglobin, large leukocytes, many bacteria, greater than 50 RBCs per high-power field, and 21-50 WBCs -Urine culture still pending -We will check blood cultures x2 and these are still pending -Likely could be gram-negative rods causing his symptoms  Acute Kidney Injury on Chronic kidney disease stage IV, worsening  Hyperphosphatemia -Renal function started worsening and BUN/Cr now went from 30/2.71 -> 33/2.96 -> 37/3.20 -> 38/3.34 -Patient's Phos Level went from 4.8 -> 5.7 -> 5.6 -> 4.8 -Avoid nephrotoxic medications, contrast dyes, hypotension and renally adjust medications -Patient's BUN/creatinine had improved from admission down to 28/2.26 and is now is as above -Started IVF hydration with NS at 75 mL/hr but now stopped -Check U/A, Urine Cx, Renal U/S -Checked Bladder Scan and he was significantly retaining so Foley catheter was placed -UA showed Turbid Appearance with Small Blood, Large Leukocytes, No Bacteria, 21-50 RBC/HPF, and >50 WBC -Urine Chloride was 84, Urine Osm was 410, and Urine Sodium was 102 -Await Urine Cx Results to see if he has any GNR -Have Asked ID to weigh in on his Urinalysis as well  -Because his renal function is continually worsening and not improving we have consulted nephrology for further evaluation recommendations -Avoid further nephrotoxic medications, contrast dyes, hypotension and renally dose medications -Continue monitor and trend renal function -Repeat CMP in a.m.  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 -Continue home regimen with Amlodipine 10 mg po Daily, Clonidine 0.3 mg TID, Finasteride 5 mg po Daily, Hydralazine 50 mg po q8h, and Metoprolol Tartrate 50 mg  po BID  -C/w Labetalol 10 mg q2hprn For SBP>160 -Patient's BP was 115/65  Left heel and foot unstageable ulcer. -POA.Dressing changes per wound care -S/p right BKA -Left ABI pending (ordered 2/4) and done yesterday AM and showed "Left: Resting left ankle-brachial index indicates noncompressible left lower extremity arteries." -Currently on IV antibiotics with Daptomycin. -Can follow up with Vascular in the outpatient setting   History of Gout -Continue Allopurinol 100 mg po BID   Hypothyroidism -C/w Levothyroxine 50 mcg po Daily   DVT prophylaxis: Heparin 5,000 units sq q8h  Code Status: FULL CODE Family Communication: No family present at bedside  Disposition Plan: CIR vs. SNF patient is able to be weaned off of IV narcotics.  PT OT recommending CIR but currently not a candidate at this time as he is not meeting the needs per CIR coordinator.  Will need central access for long-term antibiotic usage for 8 weeks and go to SNF; Currently Renal Fxn has worsened and complaining of worsening Right Sided Abdominal/Flank Pain  Consultants:   Gastroenterology  Infectious Diseases    Procedures: None   Antimicrobials:  Anti-infectives (From admission, onward)   Start     Dose/Rate Route Frequency Ordered Stop   11/26/19 1730  DAPTOmycin (CUBICIN) 1,000 mg in sodium chloride 0.9 % IVPB     1,000 mg 240 mL/hr over 30 Minutes Intravenous Every 24 hours 11/26/19 1642       Subjective: Seen and examined at bedside and again was complaining about the reaction that feeling after he takes the daptomycin.  Also concerned about when he is getting his pain medications.  No chest pain, lightheadedness or dizziness.  No nausea or vomiting and thinks his abdominal pain is a little bit better today than it was yesterday.  Still not feeling very well.  Surprised that he was retaining so much urine.  No other concerns or complaints at this time.  Objective: Vitals:   12/03/19 1247 12/03/19 1701  12/03/19 2104 12/04/19 0456  BP: 130/71 135/67 (!) 151/76 (!) 145/79  Pulse: 63 65 64 63  Resp:  16 18 18   Temp:  98.1 F (36.7 C) 98.2 F (36.8 C) 98.4 F (36.9 C)  TempSrc:  Oral    SpO2:  96% 98% 97%  Weight:   111.2 kg   Height:        Intake/Output Summary (Last 24 hours) at 12/04/2019 0831 Last data filed at 12/04/2019 0700 Gross per 24 hour  Intake 340 ml  Output 1725 ml  Net -1385 ml   Filed Weights   12/01/19 2100 12/02/19 1900 12/03/19 2104  Weight: 111.4 kg 111.2 kg 111.2 kg   Examination: Physical Exam:  Constitutional: WN/WD overweight Caucasian male currently who is older appearing than his stated age appears again somewhat uncomfortable but not as anxious Eyes: Lids and conjunctivae normal, sclerae anicteric  ENMT: External Ears, Nose appear normal. Grossly normal hearing.  Neck: Appears normal, supple, no cervical masses, normal ROM, no appreciable thyromegaly; no JVD Respiratory: Mildly diminished to auscultation bilaterally, no wheezing, rales, rhonchi or crackles. Normal respiratory effort and patient is not tachypenic. No accessory muscle use.  Unlabored breathing is not wearing any supplemental oxygen via nasal cannula Cardiovascular:  RRR, no murmurs / rubs / gallops. S1 and S2 auscultated.  Abdomen: Soft, non-tender, distended secondary to body habitus. Bowel sounds positive.  GU: Deferred. Musculoskeletal: No clubbing / cyanosis of digits/nails.  Has a right BKA and his left leg is wrapped Skin: No rashes or lesions on skin evaluation but did have some left foot ulcers that were not evaluated today.. No induration; Warm and dry.  Neurologic: CN 2-12 grossly intact with no focal deficits. Romberg sign and cerebellar reflexes not assessed.  Psychiatric: Normal judgment and insight. Alert and oriented x 3. Normal mood and appropriate affect.   Data Reviewed: I have personally reviewed following labs and imaging studies  CBC: Recent Labs  Lab  11/29/19 0557 11/30/19 0539 12/01/19 0441 12/02/19 0353 12/03/19 0733  WBC 9.2 6.7 7.7 7.6 8.0  NEUTROABS  --   --  5.1 4.8 5.6  HGB 8.9* 9.1* 8.9* 8.9* 8.6*  HCT 28.0* 28.2* 27.5* 27.6* 27.7*  MCV 98.9 97.2 96.2 98.2 100.4*  PLT 177 182 192 223 [id number]   Basic Metabolic Panel: Recent Labs  Lab 11/28/19 0505 11/30/19 0824 12/01/19 0441 12/02/19 0353 12/03/19 0733  NA 141 138 139 137 137  K 3.7 4.1 4.1 4.3 4.8  CL 107 102 100 103 105  CO2 25 27 24 23 23   GLUCOSE 157* 167* 164* 178* 167*  BUN 28* 25* 30* 33* 37*  CREATININE 2.26* 2.40* 2.71* 2.96* 3.20*  CALCIUM 8.5* 8.7* 8.9 8.5* 8.7*  MG 1.7 1.7 1.7 1.8 1.7  PHOS  --  4.6 4.8* 5.7* 5.6*   GFR: Estimated Creatinine Clearance: 31.9 mL/min (A) (by C-G formula based on SCr of 3.2 mg/dL (H)). Liver Function Tests: Recent Labs  Lab 11/30/19 0824 12/01/19 0441 12/02/19 0353 12/03/19 0733  AST 22 23 21 18   ALT 13 13 14 13   ALKPHOS 80 73 74 75  BILITOT 0.4 0.2* 0.5 0.2*  PROT 6.9 6.7 6.7 6.7  ALBUMIN 2.8* 2.7* 2.7* 2.6*   No results for input(s): LIPASE, AMYLASE in the last 168 hours. No results for input(s): AMMONIA in the last 168 hours. Coagulation Profile: No results for input(s): INR, PROTIME in the last 168 hours. Cardiac Enzymes: Recent Labs  Lab 11/28/19 0505  CKTOTAL 38*   BNP (last 3 results) No results for input(s): PROBNP in the last 8760 hours. HbA1C: No results for input(s): HGBA1C in the last 72 hours. CBG: Recent Labs  Lab 12/03/19 0653 12/03/19 1125 12/03/19 1657 12/03/19 2103 12/04/19 0702  GLUCAP 153* 189* 169* 170* 154*   Lipid Profile: No results for input(s): CHOL, HDL, LDLCALC, TRIG, CHOLHDL, LDLDIRECT in the last 72 hours. Thyroid Function Tests: No results for input(s): TSH, T4TOTAL, FREET4, T3FREE, THYROIDAB in the last 72 hours. Anemia Panel: No results for input(s): VITAMINB12, FOLATE, FERRITIN, TIBC, IRON, RETICCTPCT in the last 72 hours. Sepsis Labs: No results for  input(s): PROCALCITON, LATICACIDVEN in the last 168 hours.  Recent Results (from the past 240 hour(s))  Blood Cultures x 2 sites     Status: None   Collection Time: 11/24/19 10:50 AM   Specimen: BLOOD LEFT HAND  Result Value Ref Range Status   Specimen Description BLOOD LEFT HAND  Final   Special Requests   Final    BOTTLES DRAWN AEROBIC AND ANAEROBIC Blood Culture results may not be optimal due to an inadequate volume of blood received in culture bottles   Culture   Final    NO GROWTH 5 DAYS Performed at [facility], [place] [address], [place], [place] [address]    Report Status 11/29/2019 FINAL  Final  Blood Cultures x 2 sites     Status: None   Collection Time: 11/24/19 10:55 AM   Specimen: BLOOD RIGHT HAND  Result Value Ref Range Status   Specimen Description BLOOD RIGHT HAND  Final   Special Requests   Final    BOTTLES DRAWN AEROBIC AND ANAEROBIC Blood Culture adequate volume   Culture   Final    NO GROWTH 5 DAYS Performed at [facility], [place] [address] [place], [place] [address]    Report Status 11/29/2019 FINAL  Final  MRSA PCR Screening     Status: None   Collection Time: 11/24/19  2:59 PM   Specimen: Nasopharyngeal  Result Value Ref Range Status   MRSA by PCR NEGATIVE NEGATIVE Final    Comment:        The GeneXpert MRSA Assay (FDA approved for NASAL specimens only), is one component of a comprehensive MRSA colonization surveillance program. It is not intended to diagnose MRSA infection nor to guide or monitor treatment for MRSA infections. Performed at [facility], [place] [address] [place], [place] [address]   Aerobic/Anaerobic Culture (surgical/deep wound)     Status: None   Collection Time: 11/26/19  2:41 PM   Specimen: NEEDLE ASPIRATE  Result Value Ref Range Status   Specimen Description NEEDLE ASPIRATE DISC  Final   Special Requests NONE  Final   Gram Stain NO WBC SEEN NO ORGANISMS SEEN   Final   Culture   Final    No growth  aerobically or anaerobically. Performed at [facility], [place] [address], [place],  [address]    Report Status 12/01/2019 FINAL  Final     RN Pressure Injury Documentation: Pressure Injury 11/24/19 Heel Left Unstageable - Full thickness tissue loss in which the base of the injury is covered by slough (yellow, tan, gray, green or brown) and/or eschar (tan, brown or black) in the wound bed. (Active)  11/24/19 0655  Location: Heel  Location Orientation: Left  Staging: Unstageable - Full thickness tissue loss in which the base of the injury is covered by slough (yellow, tan, gray, green or brown) and/or eschar (tan, brown or black) in the wound bed.  Wound Description (Comments):   Present on Admission: Yes     Pressure Injury 09/02/19 Foot Left Unstageable - Full thickness tissue loss in which the base of the ulcer is covered by slough (yellow, tan, gray, green or brown) and/or eschar (tan, brown or black) in the wound bed. (Active)  09/02/19 0815  Location: Foot  Location Orientation: Left  Staging: Unstageable - Full thickness tissue loss in which the base of the ulcer is covered by slough (yellow, tan, gray, green or brown) and/or eschar (tan, brown or black) in the wound bed.  Wound Description (Comments):   Present on Admission: Yes     Estimated body mass index is 28.33 kg/m as calculated from the following:   Height as of this encounter: 6\' 6"  (1.981 m).   Weight as of this encounter: 111.2 kg.  Malnutrition Type:  Nutrition Problem: Increased nutrient needs Etiology: wound healing   Malnutrition Characteristics:  Signs/Symptoms: estimated needs   Nutrition Interventions:  Interventions: Boost Breeze, Juven   Radiology Studies: CT ABDOMEN PELVIS WO CONTRAST  Result Date: 12/02/2019 CLINICAL DATA:  Abdominal pain, fever, vertebral osteomyelitis and discitis EXAM: CT ABDOMEN AND PELVIS WITHOUT  CONTRAST TECHNIQUE: Multidetector CT imaging of the abdomen and  pelvis was performed following the standard protocol without IV contrast. COMPARISON:  11/23/2019, 11/25/2019, levin 620 FINDINGS: Lower chest: No acute pleural or parenchymal lung disease. Hepatobiliary: No focal liver abnormality is seen. No gallstones, gallbladder wall thickening, or biliary dilatation. Pancreas: Unremarkable. No pancreatic ductal dilatation or surrounding inflammatory changes. Spleen: Normal in size without focal abnormality. Adrenals/Urinary Tract: There is chronic right renal atrophy. No urinary tract calculi or obstructive uropathy. Bladder is unremarkable. Adrenals are normal. Stomach/Bowel: No bowel obstruction or ileus. Normal appendix right lower quadrant. No inflammatory changes. Vascular/Lymphatic: IVC filter is noted. Minimal atherosclerosis of the aorta. No pathologic adenopathy. Reproductive: Prostate is unremarkable. Other: No abdominal wall hernia or abnormality. No abdominopelvic ascites. Musculoskeletal: Continued destructive changes at the T10/T11 disc space consistent with diskitis and vertebral osteomyelitis. Paraspinal phlegmon is noted. IMPRESSION: 1. Persistent destructive changes from T10/T11 discitis and osteomyelitis. 2. Otherwise no acute intra-abdominal or intrapelvic process. Electronically Signed   By: Randa Ngo M.D.   On: 12/02/2019 19:36   US RENAL  Result Date: 12/03/2019 CLINICAL DATA:  Acute kidney injury EXAM: RENAL / URINARY TRACT ULTRASOUND COMPLETE COMPARISON:  09/22/2019 [place] , CT 12/01/2018 FINDINGS: Right Kidney: Renal measurements: 10.5 x 4.1 x 4.3 cm = volume: 94.9 mL. Cortical echogenicity appears slightly increased. Renal cortical thinning is present. No hydronephrosis Left Kidney: Renal measurements: 14.9 x 5.8 x 5.5 cm = volume: 249.9 mL. Cortical echogenicity appears slightly increased. No hydronephrosis. Bladder: Foley catheter in the bladder which is empty. Other: None. IMPRESSION: 1. Negative for hydronephrosis 2. Right kidney smaller  than the left with diffuse cortical thinning, consistent with atrophy. 3. Suggestion of increased cortical echogenicity as may be seen with medical renal disease Electronically Signed   By: Donavan Foil M.D.   On: 12/03/2019 18:38    Scheduled Meds: . allopurinol  100 mg Oral BID  . amLODipine  10 mg Oral Daily  . Chlorhexidine Gluconate Cloth  6 each Topical Daily  . cloNIDine  0.3 mg Oral TID  . feeding supplement (ENSURE ENLIVE)  237 mL Oral BID BM  . finasteride  5 mg Oral Daily  . heparin injection (subcutaneous)  5,000 Units Subcutaneous Q8H  . hydrALAZINE  50 mg Oral Q8H  . insulin aspart  0-15 Units Subcutaneous TID WC  . insulin aspart  0-5 Units Subcutaneous QHS  . levothyroxine  50 mcg Oral Q0600  . lidocaine  2 patch Transdermal Q24H  . lidocaine-prilocaine   Topical Daily  . metoprolol tartrate  50 mg Oral BID  . nutrition supplement (JUVEN)  1 packet Oral BID BM  . oxyCODONE  30 mg Oral TID  . pantoprazole  40 mg Oral Daily  . sodium chloride flush  3 mL Intravenous Q12H  . tamsulosin  0.4 mg Oral Daily   Continuous Infusions: . sodium chloride Stopped (11/27/19 2024)  . DAPTOmycin (CUBICIN)  IV 1,000 mg (12/03/19 2000)    LOS: 10 days   Kerney Elbe, DO [facility] PAGER is on AMION  If 7PM-7AM, please contact night-coverage [email or web address]

## 2019-12-04 NOTE — Consult Note (Signed)
Renal Service Consult Note [facility]  Thomas [name] 12/04/2019 Thomas [name]  Requesting Physician:  Dr Thomas [name]  Reason for Consult: Renal failure HPI: The patient is a 69 y.o. year-old with hx of HTN, gout, DM2, CKD IV, chronic pain, BPH admitted at [facility] on 1/31 for LLE cellulitis w/ falls. Pt has h/o recent MRSA discitis/ bacteremia in Nov-Dec 2020. He was started back on IV abx (dapto) for his leg. On 2/3 he had abd pain and CG emesis so was tx'd to [facility]. Pt had recurrent severe back pain and was found to have epidural abscess which was then drained by IR on 11/26/19. Current plan is for IV daptomycin prolonged course. Creat on admit was 2.73, now up to 3.34. Asked to see for renal failure.   Pt seen in room, has foley cath in place.  NO sob or orthopnes, no c/o swelling legs or abdomen. NO SOB. Followed by kidney doctor in [name], PCP is there too.   ROS  denies CP  no joint pain   no HA  no blurry vision  no rash  no diarrhea  no nausea/ vomiting   Past Medical History  Past Medical History:  Diagnosis Date  . Arthritis   . BPH (benign prostatic hyperplasia) 11/24/2019  . Chronic pain syndrome 11/24/2019  . CKD (chronic kidney disease), stage IV ([place]) 11/24/2019  . Diabetes mellitus without complication ([place])   . Gout   . Hypertension   . Muscle pain   . Physical deconditioning 11/24/2019  . Scars   . Swelling    Past Surgical History  Past Surgical History:  Procedure Laterality Date  . BELOW KNEE LEG AMPUTATION Right   . ESOPHAGOGASTRODUODENOSCOPY (EGD) WITH PROPOFOL N/A 11/25/2019   Procedure: ESOPHAGOGASTRODUODENOSCOPY (EGD) WITH PROPOFOL;  Surgeon: Thomas Ada, MD;  Location: [place];  Service: Endoscopy;  Laterality: N/A;  . IR FLUORO GUIDE CV LINE RIGHT  09/05/2019  . IR LUMBAR DISC ASPIRATION W/IMG GUIDE  11/26/2019  . IR US GUIDE VASC ACCESS RIGHT  09/05/2019  . RADIOLOGY WITH ANESTHESIA N/A 11/25/2019   Procedure: Thoracic and Lumbar  MRI WITH ANESTHESIA;  Surgeon: Radiologist, Medication, MD;  Location: [place];  Service: Radiology;  Laterality: N/A;   Family History  Family History  Family history unknown: Yes   Social History  reports that he has never smoked. He has never used smokeless tobacco. He reports that he does not drink alcohol or use drugs. Allergies  Allergies  Allergen Reactions  . Other Other (See Comments)    Blood pressure issues Per [facility] hospital: Pt states he can only take these pain meds or else he gets very sick: Oxycontin,morphine,demerol, and dilaudid are the only pain meds pt states he can take.    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 2.4.2021: He also reports that he currently take Oxycontin.   Marland Kitchen Oxymorphone Other (See Comments)    Causes kidney problems  . Aspirin Nausea And Vomiting    Per [facility]  . Beta Vulgaris Nausea And Vomiting    Beets  . Buspirone Other (See Comments)    unknown  . Cabbage Nausea And Vomiting  . Codeine Other (See Comments)    unknown  . Fish Allergy Nausea And Vomiting  . Fish-Derived Products Nausea And Vomiting  . Pentazocine Other (See Comments)    unknown  . Propoxyphene Other (See Comments)    unknown  .  Shellfish Allergy Nausea And Vomiting  . Sulfa Antibiotics Hives  . Sulfasalazine Other (See Comments)  . Amoxicillin Rash    Per [place] [Acetaminophen] Rash   Home medications Prior to Admission medications   Medication Sig Start Date End Date Taking? Authorizing Provider  allopurinol (ZYLOPRIM) 100 MG tablet Take 100 mg by mouth 2 (two) times daily.   Yes [provider]  amLODipine (NORVASC) 10 MG tablet Take 1 tablet by mouth daily as needed (high blood pressure).    Yes [provider]  cloNIDine (CATAPRES) 0.3 MG tablet Take 0.3 mg by mouth 3 (three) times daily.    Yes [provider]  Cyanocobalamin  (VITAMIN B-12) 5000 MCG TBDP Take 2,500 mcg by mouth daily.   Yes [provider]  ferrous sulfate 325 (65 FE) MG tablet Take 325 mg by mouth 2 (two) times daily.   Yes [provider]  glipiZIDE (GLUCOTROL XL) 10 MG 24 hr tablet Take 10 mg by mouth 2 (two) times daily.  12/20/15  Yes [provider]  hydrALAZINE (APRESOLINE) 50 MG tablet Take 1 tablet (50 mg total) by mouth every 8 (eight) hours. Patient taking differently: Take 50 mg by mouth 3 (three) times daily as needed (high blood pressure).  09/08/19  Yes Nita Sells, MD  levothyroxine (SYNTHROID) 50 MCG tablet Take 50 mcg by mouth daily before breakfast.    Yes [provider]  metoprolol tartrate (LOPRESSOR) 50 MG tablet Take 1 tablet (50 mg total) by mouth 2 (two) times daily. Patient taking differently: Take 50 mg by mouth 2 (two) times daily as needed (when heart rate is 78 or higher).  09/08/19  Yes Nita Sells, MD  Multiple Vitamin (MULTIVITAMIN WITH MINERALS) TABS tablet Take 1 tablet by mouth daily. 10/10/19  Yes Hosie Poisson, MD  NEURONTIN 300 MG capsule Take 300 mg by mouth 3 (three) times daily as needed (nerve pain).   Yes [provider]  OXYCONTIN 30 MG 12 hr tablet Take 1 tablet by mouth 3 (three) times daily. 11/10/19  Yes [provider]  polyethylene glycol (MIRALAX / GLYCOLAX) 17 g packet Take 17 g by mouth daily as needed for mild constipation.    Yes [provider]  tamsulosin (FLOMAX) 0.4 MG CAPS capsule Take 0.4 mg by mouth daily.   Yes [provider]  Vitamin D, Ergocalciferol, (DRISDOL) 1.25 MG (50000 UT) CAPS capsule Take 1 capsule by mouth every Saturday.    Yes [provider]  dalbavancin (DALVANCE) 500 MG SOLR Give Dalvance 1500 mg X 1 then 500 mg every 7 days X 3 doses Indication: MRSA bacteremia/discitis Last dose around 1/7 Patient not taking: Reported on 11/24/2019 10/05/19   Georgette Shell, MD  finasteride  (PROSCAR) 5 MG tablet Take 1 tablet (5 mg total) by mouth daily. Patient not taking: Reported on 11/24/2019 10/10/19   Hosie Poisson, MD  nutrition supplement, JUVEN, (JUVEN) PACK Take 1 packet by mouth 2 (two) times daily between meals. Patient not taking: Reported on 11/24/2019 10/09/19   Hosie Poisson, MD  Oxycodone HCl 20 MG TABS Take 1 tablet (20 mg total) by mouth every 8 (eight) hours as needed. Patient not taking: Reported on 11/24/2019 10/07/19   Georgette Shell, MD  PEGLOTICASE IV Inject 1 Dose into the vein every 14 (fourteen) days.    [provider]  senna-docusate (SENOKOT-S) 8.6-50 MG tablet Take 1 tablet by mouth 2 (two) times daily. Patient not taking:  Reported on 11/24/2019 10/09/19   Hosie Poisson, MD  tiZANidine (ZANAFLEX) 2 MG tablet Take 1 tablet (2 mg total) by mouth every 8 (eight) hours as needed for muscle spasms. Patient not taking: Reported on 11/24/2019 09/08/19   Nita Sells, MD     Vitals:   12/03/19 2104 12/04/19 0456 12/04/19 0930 12/04/19 1823  BP: (!) 151/76 (!) 145/79 115/65 140/69  Pulse: 64 63 (!) 55 70  Resp: '18 18 20   ' Temp: 98.2 F (36.8 C) 98.4 F (36.9 C) 98.2 F (36.8 C) 98.1 F (36.7 C)  TempSrc:   Oral   SpO2: 98% 97% 96% 99%  Weight: 111.2 kg     Height:       Exam Gen alert, cheerful, no distress No rash, cyanosis or gangrene Sclera anicteric, throat clear  No jvd or bruits Chest clear bilat to bases RRR no MRG Abd soft ntnd no mass or ascites +bs GU normal male w/ foley in place , clear light yellow urine MS no joint effusions or deformity, R BKA Ext no LE or stump edema, no wounds or ulcers Neuro is alert, Ox 3 , nf    UA 2/13 > cloudy, large LE/ mod Hb, 6.0, prot 30, 21-50wbc/ >50 rbc, many bact    UCx 2/13 > mult species, recollect   UNa 102 (2/13)         Date   Creat  eGFR  Nov- Dec 2020 2.68- 3.50 17- 25 ml/min, stage IV  Nov 22, 2019  2.73  Dec 04, 2019  3.34    Renal US 2/13  > R 10.5, L 14.9 cm,  no hydro, ^'d echotexture bilat and sig cortical thinning on the R, empty bladder w/ foley in place   Abd / pelvis CT scan (noncon) 2/12 > IMPRESSION: Persistent destructive changes from T10/T11 discitis and osteomyelitis. Otherwise no acute intra-abdominal or intrapelvic process.     I/O since 2/3 admit >> 7.6L in and 13 L out, net neg 6L    Wt 109kg on 2/4 >> 111 kg 2/13     IV abx here are daptomycin only    Getting PPI IV > po     No nsaids/ acei/ arb or contrast   Home meds:  - norvasc 10/ clonidine 0.3 tid/ hydralazine 50 tid/ metoprolol 50 bid  - allopurinol 100 bid/ synthroid 50 qd/ tamsulosin 0.4 qd/ finasteride 5 qd  - glipizide 10 bid  - oxycodone IR prn/ zanaflex prn  - prn's/ vitamins/ supplements  Assessment/ Plan: 1. AoCKD IV - baseline creat from nov-dec 2020 is 2.6- 3.4, eGFR 17- 25 ml/min, CKD stage IV.  Pt admitted here w creat 2.7, 2 wks ago and up to 3.3 today. On exam looks slightly dry and I/O's are neg since admit here. Renal US w/o hydro. Will start IVF's at moderate rate. His renal function isn't really off from his baseline 2.6- 3.4.  No meds implicated. Will follow.  2. Epidural abscess - sp IR drain on 2/6, back on daptomycin 3. Recent discitis/ bacteremia MRSA - in Nov-dec 2020, rx'd w/ course of IV abx 4. HTN - bp's okay home meds 5. BPH - on flomax, foley in place    Rob Jayliani Wanner  MD 12/04/2019, 6:58 PM  Recent Labs  Lab 12/03/19 0733 12/04/19 0845  WBC 8.0 6.6  HGB 8.6* 9.4*   Recent Labs  Lab 11/28/19 0505 11/28/19 0505 11/30/19 0277 11/30/19 4128 12/01/19 0441 12/01/19 0441 12/02/19 0353 12/02/19 0353 12/03/19  8185 12/04/19 0845  K 3.7   < > 4.1   < > 4.1   < > 4.3   < > 4.8 5.1  BUN 28*   < > 25*   < > 30*   < > 33*   < > 37* 38*  CREATININE 2.26*   < > 2.40*   < > 2.71*   < > 2.96*   < > 3.20* 3.34*  CALCIUM 8.5*  --  8.7*  --  8.9  --  8.5*  --  8.7* 8.6*  PHOS  --   --  4.6  --  4.8*  --  5.7*  --  5.6* 4.8*   < > = values in  this interval not displayed.

## 2019-12-05 DIAGNOSIS — G9520 Unspecified cord compression: Secondary | ICD-10-CM

## 2019-12-05 DIAGNOSIS — G061 Intraspinal abscess and granuloma: Secondary | ICD-10-CM

## 2019-12-05 LAB — GLUCOSE, CAPILLARY
Glucose-Capillary: 126 mg/dL — ABNORMAL HIGH (ref 70–99)
Glucose-Capillary: 178 mg/dL — ABNORMAL HIGH (ref 70–99)
Glucose-Capillary: 150 mg/dL — ABNORMAL HIGH (ref 70–99)
Glucose-Capillary: 229 mg/dL — ABNORMAL HIGH (ref 70–99)

## 2019-12-05 LAB — COMPREHENSIVE METABOLIC PANEL
ALT: 12 U/L (ref 0–44)
AST: 17 U/L (ref 15–41)
Albumin: 2.7 g/dL — ABNORMAL LOW (ref 3.5–5.0)
Alkaline Phosphatase: 76 U/L (ref 38–126)
Anion gap: 10 (ref 5–15)
BUN: 38 mg/dL — ABNORMAL HIGH (ref 8–23)
CO2: 23 mmol/L (ref 22–32)
Calcium: 9 mg/dL (ref 8.9–10.3)
Chloride: 106 mmol/L (ref 98–111)
Creatinine, Ser: 3.18 mg/dL — ABNORMAL HIGH (ref 0.61–1.24)
GFR calc Af Amer: 22 mL/min — ABNORMAL LOW (ref >60)
GFR calc non Af Amer: 19 mL/min — ABNORMAL LOW (ref >60)
Glucose, Bld: 167 mg/dL — ABNORMAL HIGH (ref 70–99)
Potassium: 5.8 mmol/L — ABNORMAL HIGH (ref 3.5–5.1)
Sodium: 139 mmol/L (ref 135–145)
Total Bilirubin: 0.5 mg/dL (ref 0.3–1.2)
Total Protein: 7 g/dL (ref 6.5–8.1)

## 2019-12-05 LAB — CBC WITH DIFFERENTIAL/PLATELET
Abs Immature Granulocytes: 0.07 10*3/uL (ref 0.00–0.07)
Basophils Absolute: 0 10*3/uL (ref 0.0–0.1)
Basophils Relative: 0 %
Eosinophils Absolute: 0.4 10*3/uL (ref 0.0–0.5)
Eosinophils Relative: 4 %
HCT: 29 % — ABNORMAL LOW (ref 39.0–52.0)
Hemoglobin: 8.9 g/dL — ABNORMAL LOW (ref 13.0–17.0)
Immature Granulocytes: 1 %
Lymphocytes Relative: 16 %
Lymphs Abs: 1.4 10*3/uL (ref 0.7–4.0)
MCH: 30.9 pg (ref 26.0–34.0)
MCHC: 30.7 g/dL (ref 30.0–36.0)
MCV: 100.7 fL — ABNORMAL HIGH (ref 80.0–100.0)
Monocytes Absolute: 0.6 10*3/uL (ref 0.1–1.0)
Monocytes Relative: 7 %
Neutro Abs: 6.4 10*3/uL (ref 1.7–7.7)
Neutrophils Relative %: 72 %
Platelets: 248 10*3/uL (ref 150–400)
RBC: 2.88 MIL/uL — ABNORMAL LOW (ref 4.22–5.81)
RDW: 14.1 % (ref 11.5–15.5)
WBC: 8.9 10*3/uL (ref 4.0–10.5)
nRBC: 0 % (ref 0.0–0.2)

## 2019-12-05 LAB — PHOSPHORUS: Phosphorus: 5.5 mg/dL — ABNORMAL HIGH (ref 2.5–4.6)

## 2019-12-05 LAB — MAGNESIUM: Magnesium: 1.9 mg/dL (ref 1.7–2.4)

## 2019-12-05 LAB — CK: Total CK: 29 U/L — ABNORMAL LOW (ref 49–397)

## 2019-12-05 MED ORDER — SODIUM ZIRCONIUM CYCLOSILICATE 10 G PO PACK
10.0000 g | PACK | Freq: Every day | ORAL | Status: DC
  Administered 2019-12-05 – 2019-12-17 (×13): 10 g via ORAL
  Filled 2019-12-05 (×13): qty 1

## 2019-12-05 NOTE — Progress Notes (Signed)
Subjective: No new complaints   Antibiotics:  Anti-infectives (From admission, onward)   Start     Dose/Rate Route Frequency Ordered Stop   12/05/19 1000  DAPTOmycin (CUBICIN) 1,000 mg in sodium chloride 0.9 % IVPB     1,000 mg 240 mL/hr over 30 Minutes Intravenous Every other day 12/04/19 1456     11/26/19 1730  DAPTOmycin (CUBICIN) 1,000 mg in sodium chloride 0.9 % IVPB  Status:  Discontinued     1,000 mg 240 mL/hr over 30 Minutes Intravenous Every 24 hours 11/26/19 1642 12/04/19 1456      Medications: Scheduled Meds: . allopurinol  100 mg Oral BID  . amLODipine  10 mg Oral Daily  . Chlorhexidine Gluconate Cloth  6 each Topical Daily  . cloNIDine  0.3 mg Oral TID  . feeding supplement (ENSURE ENLIVE)  237 mL Oral BID BM  . finasteride  5 mg Oral Daily  . heparin injection (subcutaneous)  5,000 Units Subcutaneous Q8H  . hydrALAZINE  50 mg Oral Q8H  . insulin aspart  0-15 Units Subcutaneous TID WC  . insulin aspart  0-5 Units Subcutaneous QHS  . levothyroxine  50 mcg Oral Q0600  . lidocaine  2 patch Transdermal Q24H  . lidocaine-prilocaine   Topical Daily  . metoprolol tartrate  50 mg Oral BID  . nutrition supplement (JUVEN)  1 packet Oral BID BM  . oxyCODONE  30 mg Oral TID  . pantoprazole  40 mg Oral Daily  . sodium chloride flush  3 mL Intravenous Q12H  . tamsulosin  0.4 mg Oral Daily   Continuous Infusions: . sodium chloride Stopped (11/27/19 2024)  . sodium chloride 75 mL/hr at 12/05/19 0818  . DAPTOmycin (CUBICIN)  IV     PRN Meds:.sodium chloride, acetaminophen **OR** acetaminophen, bisacodyl, HYDROmorphone (DILAUDID) injection, labetalol, ondansetron **OR** ondansetron (ZOFRAN) IV, oxyCODONE, tiZANidine    Objective: Weight change: 0.004 kg  Intake/Output Summary (Last 24 hours) at 12/05/2019 1057 Last data filed at 12/05/2019 0900 Gross per 24 hour  Intake 1760 ml  Output 1875 ml  Net -115 ml   Blood pressure (!) 156/73, pulse (!) 59,  temperature 97.9 F (36.6 C), temperature source Oral, resp. rate 18, height 6\' 6"  (1.981 m), weight 111.2 kg, SpO2 100 %. Temp:  [97.9 F (36.6 C)-98.6 F (37 C)] 97.9 F (36.6 C) (02/15 0832) Pulse Rate:  [59-70] 59 (02/15 0832) Resp:  [18] 18 (02/15 0832) BP: (140-156)/(69-81) 156/73 (02/15 0832) SpO2:  [96 %-100 %] 100 % (02/15 0832) Weight:  [111.2 kg] 111.2 kg (02/14 2107)  Physical Exam: General: Alert and awake, oriented x3, not in any acute distress. HEENT: anicteric sclera, EOMI CVS regular rate, normal  Chest: , no wheezing, no respiratory distress Abdomen: soft non-distended,  Extremities: no edema or deformity noted bilaterally Skin: no rashes Neuro: nonfocal  CBC:    BMET Recent Labs    12/04/19 0845 12/05/19 0515  NA 137 139  K 5.1 5.8*  CL 105 106  CO2 22 23  GLUCOSE 213* 167*  BUN 38* 38*  CREATININE 3.34* 3.18*  CALCIUM 8.6* 9.0     Liver Panel  Recent Labs    12/04/19 0845 12/05/19 0515  PROT 6.8 7.0  ALBUMIN 2.6* 2.7*  AST 19 17  ALT 12 12  ALKPHOS 77 76  BILITOT 0.5 0.5       Sedimentation Rate No results for input(s): ESRSEDRATE in the last 72 hours. C-Reactive Protein No results  for input(s): CRP in the last 72 hours.  Micro Results: Recent Results (from the past 720 hour(s))  Blood Cultures x 2 sites     Status: None   Collection Time: 11/24/19 10:50 AM   Specimen: BLOOD LEFT HAND  Result Value Ref Range Status   Specimen Description BLOOD LEFT HAND  Final   Special Requests   Final    BOTTLES DRAWN AEROBIC AND ANAEROBIC Blood Culture results may not be optimal due to an inadequate volume of blood received in culture bottles   Culture   Final    NO GROWTH 5 DAYS Performed at [facility], [place] [address], [place], [place] [address]    Report Status 11/29/2019 FINAL  Final  Blood Cultures x 2 sites     Status: None   Collection Time: 11/24/19 10:55 AM   Specimen: BLOOD RIGHT HAND  Result Value Ref Range  Status   Specimen Description BLOOD RIGHT HAND  Final   Special Requests   Final    BOTTLES DRAWN AEROBIC AND ANAEROBIC Blood Culture adequate volume   Culture   Final    NO GROWTH 5 DAYS Performed at [facility], [place] [address], [place], [place] [address]    Report Status 11/29/2019 FINAL  Final  MRSA PCR Screening     Status: None   Collection Time: 11/24/19  2:59 PM   Specimen: Nasopharyngeal  Result Value Ref Range Status   MRSA by PCR NEGATIVE NEGATIVE Final    Comment:        The GeneXpert MRSA Assay (FDA approved for NASAL specimens only), is one component of a comprehensive MRSA colonization surveillance program. It is not intended to diagnose MRSA infection nor to guide or monitor treatment for MRSA infections. Performed at [facility], [place] [address], [place], [place] [address]   Aerobic/Anaerobic Culture (surgical/deep wound)     Status: None   Collection Time: 11/26/19  2:41 PM   Specimen: NEEDLE ASPIRATE  Result Value Ref Range Status   Specimen Description NEEDLE ASPIRATE DISC  Final   Special Requests NONE  Final   Gram Stain NO WBC SEEN NO ORGANISMS SEEN   Final   Culture   Final    No growth aerobically or anaerobically. Performed at [facility], [place] [address] [place], [place] [address]    Report Status 12/01/2019 FINAL  Final  Culture, Urine     Status: Abnormal   Collection Time: 12/03/19  4:49 PM   Specimen: Urine, Random  Result Value Ref Range Status   Specimen Description URINE, RANDOM  Final   Special Requests   Final    NONE Performed at [facility], [place] [address] [place], [place] [address]    Culture MULTIPLE SPECIES PRESENT, SUGGEST RECOLLECTION (A)  Final   Report Status 12/04/2019 FINAL  Final  Culture, blood (routine x 2)     Status: None (Preliminary result)   Collection Time: 12/04/19 11:16 AM   Specimen: BLOOD  Result Value Ref Range Status   Specimen Description BLOOD RIGHT ANTECUBITAL  Final    Special Requests   Final    BOTTLES DRAWN AEROBIC AND ANAEROBIC Blood Culture adequate volume   Culture   Final    NO GROWTH < 24 HOURS Performed at [facility], [place] [address], [place],  [address]    Report Status PENDING  Incomplete  Culture, blood (routine x 2)     Status:  None (Preliminary result)   Collection Time: 12/04/19 11:39 AM   Specimen: BLOOD  Result Value Ref Range Status   Specimen Description BLOOD LEFT ANTECUBITAL  Final   Special Requests   Final    BOTTLES DRAWN AEROBIC AND ANAEROBIC Blood Culture adequate volume   Culture   Final    NO GROWTH < 24 HOURS Performed at [facility], [address], [place], [place] [address]    Report Status PENDING  Incomplete    Studies/Results: US RENAL  Result Date: 12/03/2019 CLINICAL DATA:  Acute kidney injury EXAM: RENAL / URINARY TRACT ULTRASOUND COMPLETE COMPARISON:  09/22/2019 [place] , CT 12/01/2018 FINDINGS: Right Kidney: Renal measurements: 10.5 x 4.1 x 4.3 cm = volume: 94.9 mL. Cortical echogenicity appears slightly increased. Renal cortical thinning is present. No hydronephrosis Left Kidney: Renal measurements: 14.9 x 5.8 x 5.5 cm = volume: 249.9 mL. Cortical echogenicity appears slightly increased. No hydronephrosis. Bladder: Foley catheter in the bladder which is empty. Other: None. IMPRESSION: 1. Negative for hydronephrosis 2. Right kidney smaller than the left with diffuse cortical thinning, consistent with atrophy. 3. Suggestion of increased cortical echogenicity as may be seen with medical renal disease Electronically Signed   By: Donavan Foil M.D.   On: 12/03/2019 18:38      Assessment/Plan:  INTERVAL HISTORY:   Dr Megan Salon saw due to him having strange sensation with cubicin (now gone with change in dosing regimen and improvement in kidney fxn)   Principal Problem:   Thoracic discitis Active Problems:   [name], type 2) ([place])   Gout   HTN (hypertension)   Pressure  injury of skin   Upper GI bleeding   BPH (benign prostatic hyperplasia)   CKD (chronic kidney disease), stage IV ([facility])   Chronic pain syndrome   Physical deconditioning   Epidural abscess   Urinary retention    JAYCEN LAMPI is a 69 y.o. 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 Megan Salon due to him having a strange sensation while daptomycin was infusing and in the context of worsening renal function.  Seems to have had a bit of obstructive uropathy and daptomycin was changed to every other day dosing.  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   Continue with daptomycin currently every other day but may switch to every day if his kidney function continues to improve we will continue to plan on our goal of giving him 8 weeks of systemic antibiotic therapy.  He has hospital follow-up with [place] already   #2 Worsening renal fxn: seems to have been postobstructive uropathy  If continues to improve go back to daily cubicin  Jimmy Footman will followup on his GFR and adjust daptomycin  I will otherwise sign off   Please call with further questions.    LOS: 11 days   Alcide Evener 12/05/2019, 10:57 AM

## 2019-12-05 NOTE — Progress Notes (Signed)
PROGRESS NOTE    Thomas [name]  [id number] DOB: 08-08-1951 DOA: 11/24/2019 PCP: Antonietta Jewel, MD   Brief Narrative:  The patient is a 69 y.o.malewith h/oCVA, HTN, hyperlipidemia,Gout,DM, CBP,PAD,s/pright BKA, chronic anemia, who was admitted on 1/31 to [facility] a fall with inability to get up on his own- prolonged down time. He was found to have a LLE wound/cellulitis with concern for outpatient treatment failure with Clindamycin. He was treated with IV Dapto with plan to transition to Linezolid at d/c (due to h/o MRSA bacteremia/diskitis in Nov 2020). On the evening of 2/3, he developed abdominal pain with n/v with coffee ground emesis that was gastroccult positive and so he was transferred to [facility] for GI evaluation. Patient also c/o severe back pain. Found to have epidural abscess likely from his previous MRSA infection.Underwent IR guided drainage.Currently plan isIV Daptomycin prolonged abx treatment.  Was still complaining of some right-sided flank and abdominal pain so we will obtain a CT of the abdomen pelvis but will have to do it without contrast difficult given his worsening renal function.  CT of the abdomen pelvis without contrast showed persistent destructive changes from T10/T11 discitis and osteomyelitis but no other acute intra-abdominal and intrapelvic processes and did mention that the bladder was unremarkable and adrenals were normal.  We had restarted IV fluids with on him currently as well but these have now stopped.  Still complaining of some pain and states he continues to have a weird reaction to his daptomycin.  His renal function started worsening so we will obtain a urinalysis, urine culture, renal ultrasound as well as a bladder scan.  Bladder scan showed is retaining and nursing reported that he had thick milky colored urine coming out so urinalysis still pending.  Renal ultrasound was ordered and showed "Negative for hydronephrosis. Right kidney smaller than  the left with diffuse cortical thinning, consistent with atrophy. Suggestion of increased cortical echogenicity as may be seen with medical renal disease."  The patient's renal function still slightly worsened so have consulted nephrology for further evaluation recommendations and ID is to see the patient again due to his concern about the feeling he gets when he takes the daptomycin.  Assessment & Plan:   Principal Problem:   Thoracic discitis Active Problems:   [name], type 2) (HCC)   Gout   HTN (hypertension)   Pressure injury of skin   Upper GI bleeding   BPH (benign prostatic hyperplasia)   CKD (chronic kidney disease), stage IV (HCC)   Chronic pain syndrome   Physical deconditioning   Epidural abscess   Urinary retention  T10-T11 Discitis with Epidural Abscess, MRSA bacteremia -He was previously treated at [facility] from 11/11-12/20 for T10-11 discitis and MRSA bacteremia.  -He was treated with IV Daptomycin as inpatient and transitioned to [place] at the time of d/c to home.  -He was discharged with home health. -MRI this admission shows evidence of epidural abscess. Neuro surgery, Infectious disease consulted.  -Now S/PIR guided drainage of the epidural abscess (on 11/26/2019, cx -ve so far). -Started back on IV daptomycin 2/6 after discussion with ID and will continue and CK was 38 and now is 29 -CRP was 8.3; Infectious diseases recommends 8 weeks of IV daptomycin and have the patient arrange for hospital follow-up; Daptomycin has been changed to every other day dosing given his worsened renal function -Appreciate their f/u--they recommend central line and transition to OPAT when stable for discharge (when off IV pain meds) and  Discharge Disposition secure (CIR vs. SNF) -OOB to Chair if able -Continues to have significant pain and complaining of right-sided abdominal pain flank pain so obtain a CT of the abdomen pelvis with oral contrast -CT of the abdomen  pelvis without contrast showed persistent destructive changes from T10/T11 discitis and osteomyelitis but no other acute intra-abdominal and intrapelvic processes and did mention that the bladder was unremarkable and adrenals were normal. -Continues to complain about a weird sensation after he receives daptomycin and will need to discuss this with ID and Dr. Megan Salon will be by to evaluate him today to discuss this; patient is agreeable to start taking the daptomycin but just gets concerned with this reaction that he is having and he described it as "the MRSA bacteria hiding from the antibiotics".  Hematemesis Macrocytic Anemia/Anemia of Chronic Kidney Disease  -H&H remained stable and went from 9.1/28.2 -> 8.9/27.5 -> 8.9/27.6 -> 8.6/27.7 -> 9.4/29.8 -> 8.9/29.0 -SP upper GI endoscopy by Dr. Benson Norway. -No evidence of acute abnormality. -Continue PPI patient is back on regular diet. Avoid NSAIDs. -DVT prophylaxis resumed today with heparin  Chronic Pain Syndrome -Patient is on chronic oxycodone 30 mg 3 times daily as well as Tizanidine 2 m po q8h -Now, additionally, he is on Oxycodone 10 mg every 4 hours PRN and Dilaudid 1 mg IV  q6hfor severe pain -Added Lidocaine patch and claims does not help; have added EMLA cream now -Watch for drug seeking behavior and Judicious Use of IV Narcotics -Checked CT of the Abd/Pelvis and as above -Continues to complain of some pain    BPH Acute Urinary Retention -Continue Tamsulosin 0.4 mg po Daily and Finasteride 5 mg po Daily  -Patient was retaining urine so Foley catheter was placed and drained 775 mL immediately -Bladder scan showed the patient has significant amount of urinary retention -Renal ultrasound done and showed "Negative for hydronephrosis. Right kidney smaller than the left with diffuse cortical thinning, consistent with atrophy. Suggestion of increased cortical echogenicity as may be seen with medical renal disease." -C/w Foley Catheter  Drainage; Likely will need Urology follow up at D/C   Suspected UTI -Initial UA showed Turbid Appearance with Small Blood, Large Leukocytes, No Bacteria, 21-50 RBC/HPF, and >50 WBC -Repeat after Foley was placed showed cloudy appearance, moderate hemoglobin, large leukocytes, many bacteria, greater than 50 RBCs per high-power field, and 21-50 WBCs -Urine culture showed Multiple Species -We will check blood cultures x2 and these are NGTD at 1 Day  -Likely could be gram-negative rods causing his symptoms but Infectious Diseases does not feel that he clinically does not have a UTI so will hold off on GNR Treatment    Acute Kidney Injury on Chronic kidney disease stage IV, worsening  Hyperphosphatemia Hyperkalemia -Renal function started worsening and BUN/Cr now went from 28/2.26 -> 30/2.71 -> 33/2.96 -> 37/3.20 -> 38/3.34 -> 38/3.18 -Patient's Phos Level went from 4.8 -> 5.7 -> 5.6 -> 4.8 -> 5.5 -K+ was 5.8 this AM and given Lokelma by Nephrology  -Avoid nephrotoxic medications, contrast dyes, hypotension and renally adjust medications -Patient's BUN/creatinine had improved from admission down to 28/2.26 and is now is as above -Started IVF hydration with NS at 75 mL/hr but now stopped -Check U/A, Urine Cx, Renal U/S -Checked Bladder Scan and he was significantly retaining so Foley catheter was placed -UA showed Turbid Appearance with Small Blood, Large Leukocytes, No Bacteria, 21-50 RBC/HPF, and >50 WBC -Urine Chloride was 84, Urine Osm was 410, and Urine Sodium was 102 -  Await Urine Cx Results to see if he has any GNR -Have Asked ID to weigh in on his Urinalysis as well and they don't feel like he has an active infection  -Because his renal function is continually worsening and not improving we have consulted nephrology for further evaluation recommendations -Avoid further nephrotoxic medications, contrast dyes, hypotension and renally dose medications -Continue monitor and trend renal  function -Repeat CMP in a.m.  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 -Continue home regimen with Amlodipine 10 mg po Daily, Clonidine 0.3 mg TID, Finasteride 5 mg po Daily, Hydralazine 50 mg po q8h, and Metoprolol Tartrate 50 mg po BID  -C/w Labetalol 10 mg q2hprn For SBP>160 -Patient's BP was 139/78  Left heel and foot unstageable ulcer. -POA.Dressing changes per wound care -S/p right BKA -Left ABI pending (ordered 2/4) and done yesterday AM and showed "Left: Resting left ankle-brachial index indicates noncompressible left lower extremity arteries." -Currently on IV antibiotics with Daptomycin. -Can follow up with Vascular in the outpatient setting   History of Gout -Continue Allopurinol 100 mg po BID   Hypothyroidism -C/w Levothyroxine 50 mcg po Daily   DVT prophylaxis: Heparin 5,000 units sq q8h  Code Status: FULL CODE Family Communication: No family present at bedside  Disposition Plan: CIR vs. SNF patient is able to be weaned off of IV narcotics.  PT OT recommending CIR but currently not a candidate at this time as he is not meeting the needs per CIR coordinator.  Will need central access for long-term antibiotic usage for 8 weeks and go to SNF; Currently Renal Fxn has worsened and complaining of worsening Right Sided Abdominal/Flank Pain and now has Acute Urinary Retention along with AKI on CKD  Consultants:   Gastroenterology  Infectious Diseases    Procedures: None   Antimicrobials:  Anti-infectives (From admission, onward)   Start     Dose/Rate Route Frequency Ordered Stop   12/05/19 1000  DAPTOmycin (CUBICIN) 1,000 mg in sodium chloride 0.9 % IVPB     1,000 mg 240 mL/hr over 30 Minutes Intravenous Every other day  12/04/19 1456     11/26/19 1730  DAPTOmycin (CUBICIN) 1,000 mg in sodium chloride 0.9 % IVPB  Status:  Discontinued     1,000 mg 240 mL/hr over 30 Minutes Intravenous Every 24 hours 11/26/19 1642 12/04/19 1456     Subjective: Seen and examined at bedside and he was asleep I woke him up from sleep and he states that he is feeling okay and little bit better.  No nausea or vomiting.  Happy that his renal function is starting to trend down now wanting to rest.  No other concerns or complaints at this time.  Objective: Vitals:   12/04/19 1823 12/04/19 2107 12/05/19 0451 12/05/19 0832  BP: 140/69 (!) 148/81 (!) 142/78 (!) 156/73  Pulse: 70 64 63 (!) 59  Resp:  18 18 18   Temp: 98.1 F (36.7 C) 98.2 F (36.8 C) 98.6 F (37 C) 97.9 F (36.6 C)  TempSrc:    Oral  SpO2: 99% 100% 96% 100%  Weight:  111.2 kg    Height:        Intake/Output Summary (Last 24 hours) at 12/05/2019 0842 Last data filed at 12/05/2019 0600 Gross per 24 hour  Intake 1560 ml  Output 1875 ml  Net -315 ml   Filed Weights   12/02/19 1900 12/03/19 2104 12/04/19 2107  Weight: 111.2 kg 111.2 kg 111.2 kg   Examination: Physical Exam:  Constitutional: WN/WD overweight Caucasian male currently in NAD and appears calm and comfortable as he is sleeping and I woke him up from sleep Eyes: Lids and conjunctivae normal, sclerae anicteric  ENMT: External Ears, Nose appear normal. Grossly normal hearing. Neck: Appears normal, supple, no cervical masses, normal ROM, no appreciable thyromegaly; no JVD Respiratory: Diminished to auscultation bilaterally, no wheezing, rales, rhonchi or crackles. Normal respiratory effort and patient is not tachypenic. No accessory muscle use.  Unlabored breathing and is not wearing any supplemental oxygen via nasal cannula Cardiovascular: RRR, no murmurs / rubs / gallops. S1 and S2 auscultated.  Abdomen: Soft, non-tender, non-distended. No masses palpated. No appreciable hepatosplenomegaly. Bowel  sounds positive.  GU: Deferred. Musculoskeletal: He has a right BKA and left leg is wrapped with an Ace wrap Skin: No rashes, but does have foot ulcers on the left. No induration; Warm and dry.  Neurologic: CN 2-12 grossly intact with no focal deficits. Romberg sign cerebellar reflexes not assessed.  Psychiatric: Normal judgment and insight.  Is asleep but somnolent but easily arousable. Normal mood and appropriate affect.   Data Reviewed: I have personally reviewed following labs and imaging studies  CBC: Recent Labs  Lab 12/01/19 0441 12/02/19 0353 12/03/19 0733 12/04/19 0845 12/05/19 0515  WBC 7.7 7.6 8.0 6.6 8.9  NEUTROABS 5.1 4.8 5.6 4.5 6.4  HGB 8.9* 8.9* 8.6* 9.4* 8.9*  HCT 27.5* 27.6* 27.7* 29.8* 29.0*  MCV 96.2 98.2 100.4* 98.3 100.7*  PLT 192 223 220 235 [id number]   Basic Metabolic Panel: Recent Labs  Lab 12/01/19 0441 12/02/19 0353 12/03/19 0733 12/04/19 0845 12/05/19 0515  NA 139 137 137 137 139  K 4.1 4.3 4.8 5.1 5.8*  CL 100 103 105 105 106  CO2 24 23 23 22 23   GLUCOSE 164* 178* 167* 213* 167*  BUN 30* 33* 37* 38* 38*  CREATININE 2.71* 2.96* 3.20* 3.34* 3.18*  CALCIUM 8.9 8.5* 8.7* 8.6* 9.0  MG 1.7 1.8 1.7 1.8 1.9  PHOS 4.8* 5.7* 5.6* 4.8* 5.5*   GFR: Estimated Creatinine Clearance: 32.1 mL/min (A) (by C-G formula based on SCr of 3.18 mg/dL (H)). Liver Function Tests: Recent Labs  Lab 12/01/19 0441 12/02/19 0353 12/03/19 0733 12/04/19 0845 12/05/19 0515  AST 23 21 18 19 17   ALT 13 14 13 12 12   ALKPHOS 73 74 75 77 76  BILITOT 0.2* 0.5 0.2* 0.5 0.5  PROT 6.7 6.7 6.7 6.8 7.0  ALBUMIN 2.7* 2.7* 2.6* 2.6* 2.7*   No results for input(s): LIPASE, AMYLASE in the last 168 hours. No results for input(s): AMMONIA in the last 168 hours. Coagulation Profile: No results for input(s): INR, PROTIME in the last 168 hours. Cardiac Enzymes: Recent Labs  Lab 12/05/19 0515  CKTOTAL 29*   BNP (last 3 results) No results for input(s): PROBNP in the last 8760  hours. HbA1C: No results for input(s): HGBA1C in the last 72 hours. CBG: Recent Labs  Lab 12/04/19 0702 12/04/19 1200 12/04/19 1628 12/04/19 2108 12/05/19 0718  GLUCAP 154* 179* 123* 148* 126*   Lipid Profile: No results for input(s): CHOL, HDL, LDLCALC, TRIG, CHOLHDL, LDLDIRECT in the last 72 hours. Thyroid Function Tests: No results for input(s): TSH, T4TOTAL, FREET4, T3FREE, THYROIDAB in the last 72 hours. Anemia Panel: No results for input(s): VITAMINB12, FOLATE, FERRITIN, TIBC, IRON, RETICCTPCT in  the last 72 hours. Sepsis Labs: No results for input(s): PROCALCITON, LATICACIDVEN in the last 168 hours.  Recent Results (from the past 240 hour(s))  Aerobic/Anaerobic Culture (surgical/deep wound)     Status: None   Collection Time: 11/26/19  2:41 PM   Specimen: NEEDLE ASPIRATE  Result Value Ref Range Status   Specimen Description NEEDLE ASPIRATE DISC  Final   Special Requests NONE  Final   Gram Stain NO WBC SEEN NO ORGANISMS SEEN   Final   Culture   Final    No growth aerobically or anaerobically. Performed at [facility], [place] [address], [place], [place] [address]    Report Status 12/01/2019 FINAL  Final  Culture, Urine     Status: Abnormal   Collection Time: 12/03/19  4:49 PM   Specimen: Urine, Random  Result Value Ref Range Status   Specimen Description URINE, RANDOM  Final   Special Requests   Final    NONE Performed at [facility], [place] [address] [place], [place] [address]    Culture MULTIPLE SPECIES PRESENT, SUGGEST RECOLLECTION (A)  Final   Report Status 12/04/2019 FINAL  Final  Culture, blood (routine x 2)     Status: None (Preliminary result)   Collection Time: 12/04/19 11:16 AM   Specimen: BLOOD  Result Value Ref Range Status   Specimen Description BLOOD RIGHT ANTECUBITAL  Final   Special Requests   Final    BOTTLES DRAWN AEROBIC AND ANAEROBIC Blood Culture adequate volume   Culture   Final    NO GROWTH < 24 HOURS Performed at [facility], [place] [address], [place], [place] [address]    Report Status PENDING  Incomplete  Culture, blood (routine x 2)     Status: None (Preliminary result)   Collection Time: 12/04/19 11:39 AM   Specimen: BLOOD  Result Value Ref Range Status   Specimen Description BLOOD LEFT ANTECUBITAL  Final   Special Requests   Final    BOTTLES DRAWN AEROBIC AND ANAEROBIC Blood Culture adequate volume   Culture   Final    NO GROWTH < 24 HOURS Performed at [facility], [place] [address], [place], [place] [address]    Report Status PENDING  Incomplete     RN Pressure Injury Documentation: Pressure Injury 11/24/19 Heel Left Unstageable - Full thickness tissue loss in which the base of the injury is covered by slough (yellow, tan, gray, green or brown) and/or eschar (tan, brown or black) in the wound bed. (Active)  11/24/19 0655  Location: Heel  Location Orientation: Left  Staging: Unstageable - Full thickness tissue loss in which the base of the injury is covered by slough (yellow, tan, gray, green or brown) and/or eschar (tan, brown or black) in the wound bed.  Wound Description (Comments):   Present on Admission: Yes     Pressure Injury 09/02/19 Foot Left Unstageable - Full thickness tissue loss in which the base of the ulcer is covered by slough (yellow, tan, gray, green or brown) and/or eschar (tan, brown or black) in the wound bed. (Active)  09/02/19 0815  Location: Foot  Location Orientation: Left  Staging: Unstageable - Full thickness tissue loss in which the base of the ulcer is covered by slough (yellow, tan, gray, green or brown) and/or eschar (tan, brown or black) in the wound bed.  Wound Description (Comments):   Present on Admission: Yes     Estimated body mass index is 28.33 kg/m  as calculated from the following:   Height as of this encounter: 6\' 6"  (1.981 m).   Weight as of this encounter: 111.2 kg.  Malnutrition Type:  Nutrition Problem: Increased nutrient needs  Etiology: wound healing   Malnutrition Characteristics:  Signs/Symptoms: estimated needs   Nutrition Interventions:  Interventions: Boost Breeze, Juven   Radiology Studies: US RENAL  Result Date: 12/03/2019 CLINICAL DATA:  Acute kidney injury EXAM: RENAL / URINARY TRACT ULTRASOUND COMPLETE COMPARISON:  09/22/2019 [place] , CT 12/01/2018 FINDINGS: Right Kidney: Renal measurements: 10.5 x 4.1 x 4.3 cm = volume: 94.9 mL. Cortical echogenicity appears slightly increased. Renal cortical thinning is present. No hydronephrosis Left Kidney: Renal measurements: 14.9 x 5.8 x 5.5 cm = volume: 249.9 mL. Cortical echogenicity appears slightly increased. No hydronephrosis. Bladder: Foley catheter in the bladder which is empty. Other: None. IMPRESSION: 1. Negative for hydronephrosis 2. Right kidney smaller than the left with diffuse cortical thinning, consistent with atrophy. 3. Suggestion of increased cortical echogenicity as may be seen with medical renal disease Electronically Signed   By: Donavan Foil M.D.   On: 12/03/2019 18:38    Scheduled Meds: . allopurinol  100 mg Oral BID  . amLODipine  10 mg Oral Daily  . Chlorhexidine Gluconate Cloth  6 each Topical Daily  . cloNIDine  0.3 mg Oral TID  . feeding supplement (ENSURE ENLIVE)  237 mL Oral BID BM  . finasteride  5 mg Oral Daily  . heparin injection (subcutaneous)  5,000 Units Subcutaneous Q8H  . hydrALAZINE  50 mg Oral Q8H  . insulin aspart  0-15 Units Subcutaneous TID WC  . insulin aspart  0-5 Units Subcutaneous QHS  . levothyroxine  50 mcg Oral Q0600  . lidocaine  2 patch Transdermal Q24H  . lidocaine-prilocaine   Topical Daily  . metoprolol tartrate  50 mg Oral BID  . nutrition supplement (JUVEN)  1 packet Oral BID BM  . oxyCODONE  30 mg Oral TID  . pantoprazole  40 mg Oral Daily  . sodium chloride flush  3 mL Intravenous Q12H  . tamsulosin  0.4 mg Oral Daily   Continuous Infusions: . sodium chloride Stopped (11/27/19 2024)  . sodium  chloride 75 mL/hr at 12/05/19 0818  . DAPTOmycin (CUBICIN)  IV      LOS: 11 days   Kerney Elbe, DO [facility] PAGER is on AMION  If 7PM-7AM, please contact night-coverage [email or web address]

## 2019-12-05 NOTE — Progress Notes (Addendum)
Patient ID: Thomas [name], male   DOB: September 10, 1951, 69 y.o.   MRN: [id number] S: Main complaint is back pain O:BP (!) 156/73 (BP Location: Left Arm)   Pulse (!) 59   Temp 97.9 F (36.6 C) (Oral)   Resp 18   Ht 6\' 6"  (1.981 m)   Wt 111.2 kg   SpO2 100%   BMI 28.33 kg/m   Intake/Output Summary (Last 24 hours) at 12/05/2019 1615 Last data filed at 12/05/2019 1545 Gross per 24 hour  Intake 1660.52 ml  Output 1925 ml  Net -264.48 ml   Intake/Output: I/O last 3 completed shifts: In: 2140 [P.O.:1200; I.V.:700; Other:120; IV Piggyback:120] Out: 2350 [Urine:2350]  Intake/Output this shift:  Total I/O In: 700.5 [P.O.:200; I.V.:380.5; IV Piggyback:120] Out: 1050 [Urine:1050] Weight change: 0.004 kg Gen: NAD CVS: no rub Resp: cta Abd: +BS, soft, NT Ext: no edema on left, s/p RBKA  Recent Labs  Lab 11/30/19 0824 12/01/19 0441 12/02/19 0353 12/03/19 0733 12/04/19 0845 12/05/19 0515  NA 138 139 137 137 137 139  K 4.1 4.1 4.3 4.8 5.1 5.8*  CL 102 100 103 105 105 106  CO2 27 24 23 23 22 23   GLUCOSE 167* 164* 178* 167* 213* 167*  BUN 25* 30* 33* 37* 38* 38*  CREATININE 2.40* 2.71* 2.96* 3.20* 3.34* 3.18*  ALBUMIN 2.8* 2.7* 2.7* 2.6* 2.6* 2.7*  CALCIUM 8.7* 8.9 8.5* 8.7* 8.6* 9.0  PHOS 4.6 4.8* 5.7* 5.6* 4.8* 5.5*  AST 22 23 21 18 19 17   ALT 13 13 14 13 12 12    Liver Function Tests: Recent Labs  Lab 12/03/19 0733 12/04/19 0845 12/05/19 0515  AST 18 19 17   ALT 13 12 12   ALKPHOS 75 77 76  BILITOT 0.2* 0.5 0.5  PROT 6.7 6.8 7.0  ALBUMIN 2.6* 2.6* 2.7*   No results for input(s): LIPASE, AMYLASE in the last 168 hours. No results for input(s): AMMONIA in the last 168 hours. CBC: Recent Labs  Lab 12/01/19 0441 12/01/19 0441 12/02/19 0353 12/02/19 0353 12/03/19 0733 12/04/19 0845 12/05/19 0515  WBC 7.7   < > 7.6   < > 8.0 6.6 8.9  NEUTROABS 5.1   < > 4.8   < > 5.6 4.5 6.4  HGB 8.9*   < > 8.9*   < > 8.6* 9.4* 8.9*  HCT 27.5*   < > 27.6*   < > 27.7* 29.8* 29.0*   MCV 96.2  --  98.2  --  100.4* 98.3 100.7*  PLT 192   < > 223   < > 220 235 248   < > = values in this interval not displayed.   Cardiac Enzymes: Recent Labs  Lab 12/05/19 0515  CKTOTAL 29*   CBG: Recent Labs  Lab 12/04/19 1200 12/04/19 1628 12/04/19 2108 12/05/19 0718 12/05/19 1138  GLUCAP 179* 123* 148* 126* 178*    Iron Studies: No results for input(s): IRON, TIBC, TRANSFERRIN, FERRITIN in the last 72 hours. Studies/Results: US RENAL  Result Date: 12/03/2019 CLINICAL DATA:  Acute kidney injury EXAM: RENAL / URINARY TRACT ULTRASOUND COMPLETE COMPARISON:  09/22/2019 [place] , CT 12/01/2018 FINDINGS: Right Kidney: Renal measurements: 10.5 x 4.1 x 4.3 cm = volume: 94.9 mL. Cortical echogenicity appears slightly increased. Renal cortical thinning is present. No hydronephrosis Left Kidney: Renal measurements: 14.9 x 5.8 x 5.5 cm = volume: 249.9 mL. Cortical echogenicity appears slightly increased. No hydronephrosis. Bladder: Foley catheter in the bladder which is empty. Other: None. IMPRESSION: 1. Negative for  hydronephrosis 2. Right kidney smaller than the left with diffuse cortical thinning, consistent with atrophy. 3. Suggestion of increased cortical echogenicity as may be seen with medical renal disease Electronically Signed   By: Donavan Foil M.D.   On: 12/03/2019 18:38   . allopurinol  100 mg Oral BID  . amLODipine  10 mg Oral Daily  . Chlorhexidine Gluconate Cloth  6 each Topical Daily  . cloNIDine  0.3 mg Oral TID  . feeding supplement (ENSURE ENLIVE)  237 mL Oral BID BM  . finasteride  5 mg Oral Daily  . heparin injection (subcutaneous)  5,000 Units Subcutaneous Q8H  . hydrALAZINE  50 mg Oral Q8H  . insulin aspart  0-15 Units Subcutaneous TID WC  . insulin aspart  0-5 Units Subcutaneous QHS  . levothyroxine  50 mcg Oral Q0600  . lidocaine  2 patch Transdermal Q24H  . lidocaine-prilocaine   Topical Daily  . metoprolol tartrate  50 mg Oral BID  . nutrition supplement  (JUVEN)  1 packet Oral BID BM  . oxyCODONE  30 mg Oral TID  . pantoprazole  40 mg Oral Daily  . sodium chloride flush  3 mL Intravenous Q12H  . tamsulosin  0.4 mg Oral Daily    BMET    Component Value Date/Time   NA 139 12/05/2019 0515   K 5.8 (H) 12/05/2019 0515   CL 106 12/05/2019 0515   CO2 23 12/05/2019 0515   GLUCOSE 167 (H) 12/05/2019 0515   BUN 38 (H) 12/05/2019 0515   CREATININE 3.18 (H) 12/05/2019 0515   CALCIUM 9.0 12/05/2019 0515   GFRNONAA 19 (L) 12/05/2019 0515   GFRAA 22 (L) 12/05/2019 0515   CBC    Component Value Date/Time   WBC 8.9 12/05/2019 0515   RBC 2.88 (L) 12/05/2019 0515   HGB 8.9 (L) 12/05/2019 0515   HCT 29.0 (L) 12/05/2019 0515   PLT 248 12/05/2019 0515   MCV 100.7 (H) 12/05/2019 0515   MCH 30.9 12/05/2019 0515   MCHC 30.7 12/05/2019 0515   RDW 14.1 12/05/2019 0515   LYMPHSABS 1.4 12/05/2019 0515   MONOABS 0.6 12/05/2019 0515   EOSABS 0.4 12/05/2019 0515   BASOSABS 0.0 12/05/2019 0515     Assessment/Plan:  1. AKI/CKD stage IV- presumably due to volume depletion +/- obstructive uropathy.  BUN/CR improving with IVF's and foley catheter.  Scr back at his baseline.  Continue to follow. 2. Hyperkalemia- will treat with lokelma and follow. 3. Epidural abscess- s/p drain per IR on 11/26/19.  daptomycin per ID 4. MRSA discitis/bacteremia- as above completed tx in December 2020 5. HTN- stable 6. BPH- on flomax and foley in place.  Donetta Potts, MD [employer] [phone]

## 2019-12-05 NOTE — Progress Notes (Signed)
Pharmacy Antibiotic Note  Thomas [name] is a 69 y.o. 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 Was last on daptomycin 08/31/19 through 10/09/19 then discharged on dalbavancin weekly until around 10/27/19. Pharmacy has been consulted for daptomycin dosing. MRI this admission found progressive osteomyelitis and persistent cellulitis and abscess at T10-11 with probable epidural abscess. IR aspirate from this admission is no growth final.  Noted that patient had a bump in Scr over the weekend up to 3.34 yesterday. Scr now improved to 3.18 with CrCl ~ 32 ml/min. CK today 29.   Plan: Continue daptomycin 1000mg  IV q48- Will trend SCr 1 more day and adjust to q 24 if continues to trend down Monitor CK every Monday Monitor cultures/sensitivities, renal function, and clinical progression   Height: 6\' 6"  (198.1 cm) Weight: 245 lb 2.8 oz (111.2 kg) IBW/kg (Calculated) : 91.4  Temp (24hrs), Avg:98.2 F (36.8 C), Min:97.9 F (36.6 C), Max:98.6 F (37 C)  Recent Labs  Lab 12/01/19 0441 12/02/19 0353 12/03/19 0733 12/04/19 0845 12/05/19 0515  WBC 7.7 7.6 8.0 6.6 8.9  CREATININE 2.71* 2.96* 3.20* 3.34* 3.18*    Estimated Creatinine Clearance: 32.1 mL/min (A) (by C-G formula based on SCr of 3.18 mg/dL (H)).     Antimicrobials this admission: Daptomycin 2/6 >>    Microbiology results: 2/4 BCx: ngt 2/4 MRSA PCR: neg 2/6 epidural abscess: neg   Jimmy Footman, PharmD, BCPS, BCIDP Infectious Diseases Clinical Pharmacist Phone: [phone] Please check AMION for all [employer] phone numbers After 10:00 PM, call [place] [phone]

## 2019-12-05 NOTE — Progress Notes (Signed)
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 Patient transferred from [facility] for MRSA bacteremia. CT abdomen showing discitis involvling T10, T11 area.    PT Comments    Pt was seen for mobility and exercise, with bed mob requiring min assist at most to handle rolling in confined space.  Pt reviewed his leg exercises, and noted he is comfortable with fully flexing legs to chest but not to sit and forward flex his back.  He is becoming a static treatment since he is not making further progress with transfers, and will consider the benefit of therapy if he cannot progress further.   Follow Up Recommendations  SNF     Equipment Recommendations  None recommended by PT    Recommendations for Other Services       Precautions / Restrictions Precautions Precautions: Back;Fall Precaution Booklet Issued: No Precaution Comments: has full lumbar fusion Restrictions Weight Bearing Restrictions: No    Mobility  Bed Mobility Overal bed mobility: Needs Assistance Bed Mobility: Rolling;Supine to Sit Rolling: Supervision Sidelying to sit: Supervision Supine to sit: Supervision     General bed mobility comments: supervision for a partial effort to move  Transfers                 General transfer comment: declined  Ambulation/Gait             General Gait Details: declined   Stairs             Wheelchair Mobility    Modified Rankin (Stroke Patients Only)       Balance                                            Cognition Arousal/Alertness: Awake/alert Behavior During Therapy: WFL for tasks  assessed/performed Overall Cognitive Status: Within Functional Limits for tasks assessed                                        Exercises General Exercises - Lower Extremity Ankle Circles/Pumps: AROM;Left Quad Sets: AROM;Left;10 reps Heel Slides: AROM;Left;10 reps Hip ABduction/ADduction: AROM;Both Straight Leg Raises: AROM;Both Hip Flexion/Marching: AAROM;AROM;10 reps;Both    General Comments General comments (skin integrity, edema, etc.): Pt had water on end of bed and apparently MD had spilled it, PT helped pt change the bed      Pertinent Vitals/Pain Pain Assessment: 0-10 Pain Score: 10-Worst pain ever Pain Location: back with mobility Pain Descriptors / Indicators: Grimacing;Guarding;Discomfort Pain Intervention(s): Premedicated before session;Repositioned(states he has 10 pain all the time)    Home Living                      Prior Function            PT Goals (current goals can now be found in the care plan section) Acute Rehab PT Goals Patient Stated Goal: get home Progress towards PT goals: Not progressing toward goals - comment    Frequency  Min 2X/week      PT Plan Discharge plan needs to be updated    Co-evaluation              AM-PAC PT "6 Clicks" Mobility   Outcome Measure  Help needed turning from your back to your side while in a flat bed without using bedrails?: A Little Help needed moving from lying on your back to sitting on the side of a flat bed without using bedrails?: A Lot Help needed moving to and from a bed to a chair (including a wheelchair)?: A Lot Help needed standing up from a chair using your arms (e.g., wheelchair or bedside chair)?: Total Help needed to walk in hospital room?: Total Help needed climbing 3-5 steps with a railing? : Total 6 Click Score: 10    End of Session   Activity Tolerance: Patient limited by pain Patient left: in bed;with call bell/phone within reach;with bed alarm  set Nurse Communication: Mobility status;Patient requests pain meds PT Visit Diagnosis: History of falling (Z91.81);Pain Pain - Right/Left: Right Pain - part of body: (spine)     Time: 0935-1010 PT Time Calculation (min) (ACUTE ONLY): 35 min  Charges:  $Therapeutic Exercise: 8-22 mins $Therapeutic Activity: 8-22 mins                 Ramond Dial 12/05/2019, 1:40 PM   Mee Hives, PT MS Acute Rehab Dept. Number: [place]

## 2019-12-06 LAB — RENAL FUNCTION PANEL
Albumin: 2.5 g/dL — ABNORMAL LOW (ref 3.5–5.0)
Anion gap: 11 (ref 5–15)
BUN: 37 mg/dL — ABNORMAL HIGH (ref 8–23)
CO2: 24 mmol/L (ref 22–32)
Calcium: 8.9 mg/dL (ref 8.9–10.3)
Chloride: 102 mmol/L (ref 98–111)
Creatinine, Ser: 3.01 mg/dL — ABNORMAL HIGH (ref 0.61–1.24)
GFR calc Af Amer: 24 mL/min — ABNORMAL LOW (ref >60)
GFR calc non Af Amer: 21 mL/min — ABNORMAL LOW (ref >60)
Glucose, Bld: 172 mg/dL — ABNORMAL HIGH (ref 70–99)
Phosphorus: 5.7 mg/dL — ABNORMAL HIGH (ref 2.5–4.6)
Potassium: 5.3 mmol/L — ABNORMAL HIGH (ref 3.5–5.1)
Sodium: 137 mmol/L (ref 135–145)

## 2019-12-06 LAB — CBC
HCT: 27 % — ABNORMAL LOW (ref 39.0–52.0)
Hemoglobin: 8.5 g/dL — ABNORMAL LOW (ref 13.0–17.0)
MCH: 31.1 pg (ref 26.0–34.0)
MCHC: 31.5 g/dL (ref 30.0–36.0)
MCV: 98.9 fL (ref 80.0–100.0)
Platelets: 235 10*3/uL (ref 150–400)
RBC: 2.73 MIL/uL — ABNORMAL LOW (ref 4.22–5.81)
RDW: 14.2 % (ref 11.5–15.5)
WBC: 5.8 10*3/uL (ref 4.0–10.5)
nRBC: 0 % (ref 0.0–0.2)

## 2019-12-06 LAB — GLUCOSE, CAPILLARY
Glucose-Capillary: 151 mg/dL — ABNORMAL HIGH (ref 70–99)
Glucose-Capillary: 139 mg/dL — ABNORMAL HIGH (ref 70–99)
Glucose-Capillary: 160 mg/dL — ABNORMAL HIGH (ref 70–99)
Glucose-Capillary: 137 mg/dL — ABNORMAL HIGH (ref 70–99)

## 2019-12-06 MED ORDER — SODIUM CHLORIDE 0.9 % IV SOLN
1000.0000 mg | Freq: Every day | INTRAVENOUS | Status: DC
  Administered 2019-12-06 – 2019-12-11 (×6): 1000 mg via INTRAVENOUS
  Filled 2019-12-06 (×7): qty 20

## 2019-12-06 NOTE — Progress Notes (Signed)
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 Patient transferred from [facility] for MRSA bacteremia. CT abdomen showing discitis involvling T10, T11 area.   OT comments  Pt continues to have limitations due to pain, declined attempts to perform bed mobility. Pt elevating HOB to 21 degrees stating this is as far as he is able to tolerate without significant increase in pain. Pt engaged in bil UE and LE therex from this level for continued strengthening/endurance. Will attempt to progress to EOB next session as pt able to tolerate. Discharge recommendations have been updated to SNF given pt slower progress. Will continue to follow acutely.   Follow Up Recommendations  SNF    Equipment Recommendations  Other (comment)(TBD)          Precautions / Restrictions Precautions Precautions: Back;Fall Precaution Booklet Issued: No Precaution Comments: has full lumbar fusion Restrictions Weight Bearing Restrictions: No       Mobility Bed Mobility Overal bed mobility: Needs Assistance             General bed mobility comments: pt would not attempt bed mobility this date due to pain, reports increases with sitting upright, pt transitioned HOB to 21* but reports cannot go any further, tolerated 21* for >10 min then returned to 13*  Transfers                 General transfer comment: declined    Balance                                           ADL either performed or assessed with clinical judgement   ADL Overall ADL's : Needs assistance/impaired Eating/Feeding: Set up;Sitting;Bed level   Grooming: Set up;Bed level                                  General ADL Comments: continues to have limitations due to pain                        Cognition Arousal/Alertness: Awake/alert Behavior During Therapy: WFL for tasks assessed/performed Overall Cognitive Status: Within Functional Limits for tasks assessed                                          Exercises Exercises: General Lower Extremity;General Upper Extremity;Other exercises General Exercises - Upper Extremity Shoulder Flexion: AROM;Both;10 reps General Exercises - Lower Extremity Ankle Circles/Pumps: AROM;Both;10 reps Hip ABduction/ADduction: AROM;Both;10 reps Straight Leg Raises: AROM;Both;10 reps Hip Flexion/Marching: AROM;10 reps;Both Other Exercises Other Exercises: chest press bil UE x10   Shoulder Instructions       General Comments      Pertinent Vitals/ Pain       Pain Assessment: 0-10 Pain Score: 8  Pain Location: back with mobility Pain Descriptors / Indicators: Grimacing;Guarding;Discomfort  Home Living  Prior Functioning/Environment              Frequency  Min 2X/week        Progress Toward Goals  OT Goals(current goals can now be found in the care plan section)  Progress towards OT goals: Not progressing toward goals - comment(pain)  Acute Rehab OT Goals Patient Stated Goal: get home OT Goal Formulation: With patient Time For Goal Achievement: 12/12/19 Potential to Achieve Goals: [place] Frequency remains appropriate;Discharge plan needs to be updated    Co-evaluation                 AM-PAC OT "6 Clicks" Daily Activity     Outcome Measure   Help from another person eating meals?: None Help from another person taking care of personal grooming?: A Little Help from another person toileting, which includes using toliet, bedpan, or urinal?: Total Help from another person bathing (including washing,  rinsing, drying)?: A Lot Help from another person to put on and taking off regular upper body clothing?: A Lot Help from another person to put on and taking off regular lower body clothing?: Total 6 Click Score: 13    End of Session    OT Visit Diagnosis: Other abnormalities of gait and mobility (R26.89);Pain Pain - part of body: (back)   Activity Tolerance Patient tolerated treatment well;Patient limited by pain   Patient Left in bed;with call bell/phone within reach   Nurse Communication Mobility status        Time: [id number] OT Time Calculation (min): 32 min  Charges: OT General Charges $OT Visit: 1 Visit OT Treatments $Therapeutic Activity: 23-37 mins  Lou Cal, OT [facility] Rehabilitation Services Pager [phone] Office [phone]    [name] 12/06/2019, 4:21 PM

## 2019-12-06 NOTE — Progress Notes (Signed)
Patient ID: Thomas [name], male   DOB: April 06, 1951, 69 y.o.   MRN: [id number]  S:Feeling better today O:BP (!) 143/73 (BP Location: Left Arm)   Pulse (!) 54   Temp 97.7 F (36.5 C) (Oral)   Resp 18   Ht 6\' 6"  (1.981 m)   Wt 111.2 kg   SpO2 94%   BMI 28.33 kg/m   Intake/Output Summary (Last 24 hours) at 12/06/2019 1133 Last data filed at 12/06/2019 0346 Gross per 24 hour  Intake 1865.04 ml  Output 2250 ml  Net -384.96 ml   Intake/Output: I/O last 3 completed shifts: In: 2792.2 [P.O.:880; I.V.:1792.2; IV Piggyback:120] Out: 2825 [Urine:2825]  Intake/Output this shift:  No intake/output data recorded. Weight change:  Gen: NAD CVS: no rub Resp: cta Abd: +BS, soft, NT/ND Ext: s/p right BKA, no edema  Recent Labs  Lab 11/30/19 0824 12/01/19 0441 12/02/19 0353 12/03/19 0733 12/04/19 0845 12/05/19 0515 12/06/19 0520  NA 138 139 137 137 137 139 137  K 4.1 4.1 4.3 4.8 5.1 5.8* 5.3*  CL 102 100 103 105 105 106 102  CO2 27 24 23 23 22 23 24   GLUCOSE 167* 164* 178* 167* 213* 167* 172*  BUN 25* 30* 33* 37* 38* 38* 37*  CREATININE 2.40* 2.71* 2.96* 3.20* 3.34* 3.18* 3.01*  ALBUMIN 2.8* 2.7* 2.7* 2.6* 2.6* 2.7* 2.5*  CALCIUM 8.7* 8.9 8.5* 8.7* 8.6* 9.0 8.9  PHOS 4.6 4.8* 5.7* 5.6* 4.8* 5.5* 5.7*  AST 22 23 21 18 19 17   --   ALT 13 13 14 13 12 12   --    Liver Function Tests: Recent Labs  Lab 12/03/19 0733 12/03/19 0733 12/04/19 0845 12/05/19 0515 12/06/19 0520  AST 18  --  19 17  --   ALT 13  --  12 12  --   ALKPHOS 75  --  77 76  --   BILITOT 0.2*  --  0.5 0.5  --   PROT 6.7  --  6.8 7.0  --   ALBUMIN 2.6*   < > 2.6* 2.7* 2.5*   < > = values in this interval not displayed.   No results for input(s): LIPASE, AMYLASE in the last 168 hours. No results for input(s): AMMONIA in the last 168 hours. CBC: Recent Labs  Lab 12/02/19 0353 12/02/19 0353 12/03/19 0733 12/03/19 0733 12/04/19 0845 12/05/19 0515 12/06/19 0520  WBC 7.6   < > 8.0   < > 6.6 8.9 5.8   NEUTROABS 4.8   < > 5.6  --  4.5 6.4  --   HGB 8.9*   < > 8.6*   < > 9.4* 8.9* 8.5*  HCT 27.6*   < > 27.7*   < > 29.8* 29.0* 27.0*  MCV 98.2  --  100.4*  --  98.3 100.7* 98.9  PLT 223   < > 220   < > 235 248 235   < > = values in this interval not displayed.   Cardiac Enzymes: Recent Labs  Lab 12/05/19 0515  CKTOTAL 29*   CBG: Recent Labs  Lab 12/05/19 0718 12/05/19 1138 12/05/19 1637 12/05/19 2113 12/06/19 0702  GLUCAP 126* 178* 150* 229* 151*    Iron Studies: No results for input(s): IRON, TIBC, TRANSFERRIN, FERRITIN in the last 72 hours. Studies/Results: No results found. Marland Kitchen allopurinol  100 mg Oral BID  . amLODipine  10 mg Oral Daily  . Chlorhexidine Gluconate Cloth  6 each Topical Daily  . cloNIDine  0.3 mg Oral TID  . feeding supplement (ENSURE ENLIVE)  237 mL Oral BID BM  . finasteride  5 mg Oral Daily  . heparin injection (subcutaneous)  5,000 Units Subcutaneous Q8H  . hydrALAZINE  50 mg Oral Q8H  . insulin aspart  0-15 Units Subcutaneous TID WC  . insulin aspart  0-5 Units Subcutaneous QHS  . levothyroxine  50 mcg Oral Q0600  . lidocaine  2 patch Transdermal Q24H  . lidocaine-prilocaine   Topical Daily  . metoprolol tartrate  50 mg Oral BID  . nutrition supplement (JUVEN)  1 packet Oral BID BM  . oxyCODONE  30 mg Oral TID  . pantoprazole  40 mg Oral Daily  . sodium chloride flush  3 mL Intravenous Q12H  . sodium zirconium cyclosilicate  10 g Oral Daily  . tamsulosin  0.4 mg Oral Daily    BMET    Component Value Date/Time   NA 137 12/06/2019 0520   K 5.3 (H) 12/06/2019 0520   CL 102 12/06/2019 0520   CO2 24 12/06/2019 0520   GLUCOSE 172 (H) 12/06/2019 0520   BUN 37 (H) 12/06/2019 0520   CREATININE 3.01 (H) 12/06/2019 0520   CALCIUM 8.9 12/06/2019 0520   GFRNONAA 21 (L) 12/06/2019 0520   GFRAA 24 (L) 12/06/2019 0520   CBC    Component Value Date/Time   WBC 5.8 12/06/2019 0520   RBC 2.73 (L) 12/06/2019 0520   HGB 8.5 (L) 12/06/2019 0520    HCT 27.0 (L) 12/06/2019 0520   PLT 235 12/06/2019 0520   MCV 98.9 12/06/2019 0520   MCH 31.1 12/06/2019 0520   MCHC 31.5 12/06/2019 0520   RDW 14.2 12/06/2019 0520   LYMPHSABS 1.4 12/05/2019 0515   MONOABS 0.6 12/05/2019 0515   EOSABS 0.4 12/05/2019 0515   BASOSABS 0.0 12/05/2019 0515   Assessment/Plan:  1. AKI/CKD stage IV- presumably due to volume depletion +/- obstructive uropathy.  BUN/CR improving with IVF's and foley catheter.   1. Scr back at his baseline.   2. Decrease IVF's to 50 ml/hr 3. Will need to follow up with his primary nephrologist in [facility] after discharge. 2. Hyperkalemia- improving with with lokelma.  Continue to follow and renal diet.  Will likely need to restart lasix when Scr stabilizes. 3. Epidural abscess- s/p drain per IR on 11/26/19.  daptomycin per ID 4. MRSA discitis/bacteremia- as above completed tx in December 2020 5. HTN- stable 6. BPH- on flomax and foley in place. 7. Anemia acute on chronic due to CKD and acute infection.  Will check iron stores and follow.   Donetta Potts, MD [employer] [phone]

## 2019-12-06 NOTE — Progress Notes (Signed)
PROGRESS NOTE    Thomas [name]  [id number] DOB: 1951/09/10 DOA: 11/24/2019 PCP: Antonietta Jewel, MD   Brief Narrative:  The patient is a 69 y.o.malewith h/oCVA, HTN, hyperlipidemia,Gout,DM, CBP,PAD,s/pright BKA, chronic anemia, who was admitted on 1/31 to [place] a fall with inability to get up on his own- prolonged down time. He was found to have a LLE wound/cellulitis with concern for outpatient treatment failure with Clindamycin. He was treated with IV Dapto with plan to transition to Linezolid at d/c (due to h/o MRSA bacteremia/diskitis in Nov 2020). On the evening of 2/3, he developed abdominal pain with n/v with coffee ground emesis that was gastroccult positive and so he was transferred to [facility] for GI evaluation. Patient also c/o severe back pain. Found to have epidural abscess likely from his previous MRSA infection.Underwent IR guided drainage.Currently plan isIV Daptomycin prolonged abx treatment.  Was still complaining of some right-sided flank and abdominal pain so we will obtain a CT of the abdomen pelvis but will have to do it without contrast difficult given his worsening renal function.  CT of the abdomen pelvis without contrast showed persistent destructive changes from T10/T11 discitis and osteomyelitis but no other acute intra-abdominal and intrapelvic processes and did mention that the bladder was unremarkable and adrenals were normal.  We had restarted IV fluids with on him currently as well but these have now stopped.  Still complaining of some pain and states he continues to have a weird reaction to his daptomycin.  His renal function started worsening so we will obtain a urinalysis, urine culture, renal ultrasound as well as a bladder scan.  Bladder scan showed is retaining and nursing reported that he had thick milky colored urine coming out so urinalysis still pending.  Renal ultrasound was ordered and showed "Negative for hydronephrosis. Right kidney smaller than  the left with diffuse cortical thinning, consistent with atrophy. Suggestion of increased cortical echogenicity as may be seen with medical renal disease."  The patient's renal function still slightly worsened so have consulted nephrology for further evaluation recommendations and ID is to see the patient again due to his concern about the feeling he gets when he takes the daptomycin.  Started on IV fluid hydration by nephrology and his creatinine is now trending down.  Daptomycin dosing was adjusted to every other day given his renal function but now that is improving his daptomycin will be switched back to Daily   Assessment & Plan:   Principal Problem:   Thoracic discitis Active Problems:   [name], type 2) (HCC)   Gout   HTN (hypertension)   Pressure injury of skin   Upper GI bleeding   BPH (benign prostatic hyperplasia)   CKD (chronic kidney disease), stage IV (HCC)   Chronic pain syndrome   Physical deconditioning   Epidural abscess   Urinary retention  T10-T11 Discitis with Epidural Abscess, MRSA bacteremia -He was previously treated at [facility] from 11/11-12/20 for T10-11 discitis and MRSA bacteremia.  -He was treated with IV Daptomycin as inpatient and transitioned to [place] at the time of d/c to home.  -He was discharged with home health. -MRI this admission shows evidence of epidural abscess. Neuro surgery, Infectious disease consulted.  -Now S/PIR guided drainage of the epidural abscess (on 11/26/2019, cx -ve so far). -Started back on IV daptomycin 2/6 after discussion with ID and will continue and CK was 38 and now is 29 -CRP was 8.3; Infectious diseases recommends 8 weeks of IV daptomycin  and have the patient arrange for hospital follow-up; Daptomycin had been changed to every other day dosing given his worsened renal function but will be changed back to daily dosing now that his renal function is trending down -Appreciate their f/u--they recommend  central line and transition to OPAT when stable for discharge (when off IV pain meds) and Discharge Disposition secure (CIR vs. SNF) -OOB to Chair if able -Continues to have significant pain and complaining of right-sided abdominal pain flank pain so obtain a CT of the abdomen pelvis with oral contrast -CT of the abdomen pelvis without contrast showed persistent destructive changes from T10/T11 discitis and osteomyelitis but no other acute intra-abdominal and intrapelvic processes and did mention that the bladder was unremarkable and adrenals were normal. -Continues to complain about a weird sensation after he receives daptomycin and will need to discuss this with ID and Dr. Megan Salon will be by to evaluate him today to discuss this; patient is agreeable to start taking the daptomycin but just gets concerned with this reaction that he is having and he described it as "the MRSA bacteria hiding from the antibiotics".  Hematemesis Macrocytic Anemia/Anemia of Chronic Kidney Disease  -H&H remained stable slightly dropped from yesterday today is 8.5/27.0 -S/P upper GI endoscopy by Dr. Benson Norway. -No evidence of acute abnormality. -Continue PPI patient is back on regular diet. Avoid NSAIDs. -DVT prophylaxis resumed today with heparin  Chronic Pain Syndrome -Patient is on chronic oxycodone 30 mg 3 times daily as well as Tizanidine 2 m po q8h -Now, additionally, he is on Oxycodone 10 mg every 4 hours PRN and Dilaudid 1 mg IV  q6hfor severe pain -Added Lidocaine patch and claims does not help; have added EMLA cream now -Watch for drug seeking behavior and Judicious Use of IV Narcotics -Checked CT of the Abd/Pelvis and as above -Continues to complain of some pain    BPH Acute Urinary Retention -Continue Tamsulosin 0.4 mg po Daily and Finasteride 5 mg po Daily  -Patient was retaining urine so Foley catheter was placed and drained 775 mL immediately -Bladder scan showed the patient has significant amount  of urinary retention -Renal ultrasound done and showed "Negative for hydronephrosis. Right kidney smaller than the left with diffuse cortical thinning, consistent with atrophy. Suggestion of increased cortical echogenicity as may be seen with medical renal disease." -C/w Foley Catheter Drainage; Likely will need Urology follow up at D/C   Suspected UTI -Initial UA showed Turbid Appearance with Small Blood, Large Leukocytes, No Bacteria, 21-50 RBC/HPF, and >50 WBC -Repeat after Foley was placed showed cloudy appearance, moderate hemoglobin, large leukocytes, many bacteria, greater than 50 RBCs per high-power field, and 21-50 WBCs -Urine culture showed Multiple Species -We will check blood cultures x2 and these are NGTD at 2 Day  -Likely could be gram-negative rods causing his symptoms but Infectious Diseases does not feel that he clinically does not have a UTI so will hold off on GNR Treatment    Acute Kidney Injury on Chronic kidney disease stage IV, worsening  Hyperphosphatemia Hyperkalemia -Renal function started worsening and BUN/Cr now went from 28/2.26 -> 30/2.71 -> 33/2.96 -> 37/3.20 -> 38/3.34 -> 38/3.18 -> 37/3/01 -Patient's Phos Level went from 4.8 -> 5.7 -> 5.6 -> 4.8 -> 5.5 -> 5.7 -K+ was 5.3 this AM and given Lokelma again by Nephrology  -Avoid nephrotoxic medications, contrast dyes, hypotension and renally adjust medications -Patient's BUN/creatinine had improved from admission down to 28/2.26 and is now is as above -Started IVF hydration  with NS at 75 mL/hr per nephrology and this is been reduced down to 50 MLS per hour -Checked Bladder Scan and he was significantly retaining so Foley catheter was placed -UA showed Turbid Appearance with Small Blood, Large Leukocytes, No Bacteria, 21-50 RBC/HPF, and >50 WBC -Urine Chloride was 84, Urine Osm was 410, and Urine Sodium was 102 -Await Urine Cx Results to see if he has any GNR and showed multiple species -Have Asked ID to weigh in  on his Urinalysis as well and they don't feel like he has an active infection  -Because his renal function continues to worsen we consulted nephrology -per nephrology once his serum creatinine stabilizes will likely need to restart his Lasix as 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 -Continue home regimen with Amlodipine 10 mg po Daily, Clonidine 0.3 mg TID, Finasteride 5 mg po Daily, Hydralazine 50 mg po q8h, and Metoprolol Tartrate 50 mg po BID  -C/w Labetalol 10 mg q2hprn For SBP>160 -Patient's BP was 168/81  Left heel and foot unstageable ulcer. -POA.Dressing changes per wound care -S/p right BKA -Left ABI pending (ordered 2/4) and done yesterday AM and showed "Left: Resting left ankle-brachial index indicates noncompressible left lower extremity arteries." -Currently on IV antibiotics with Daptomycin. -Can follow up with Vascular in the outpatient setting   History of Gout -Continue Allopurinol 100 mg po BID   Hypothyroidism -C/w Levothyroxine 50 mcg po Daily   DVT prophylaxis: Heparin 5,000 units sq q8h  Code Status: FULL CODE Family Communication: No family present at bedside  Disposition Plan: CIR vs. SNF if patient is able to be weaned off of IV narcotics.  PT OT recommending CIR but currently not a candidate at this time as he is not meeting the needs per CIR coordinator.  Will need central access for long-term antibiotic usage for 8 weeks and go to SNF; Currently Renal Fxn has worsened and complaining of worsening Right Sided Abdominal/Flank  Pain and now has Acute Urinary Retention along with AKI on CKD so Nephrology is following   Consultants:   Gastroenterology  Infectious Diseases   Nephrology    Procedures: None   Antimicrobials:  Anti-infectives (From admission, onward)   Start     Dose/Rate Route Frequency Ordered Stop   12/06/19 2000  DAPTOmycin (CUBICIN) 1,000 mg in sodium chloride 0.9 % IVPB     1,000 mg 240 mL/hr over 30 Minutes Intravenous Daily 12/06/19 0807 01/20/20 2359   12/05/19 1000  DAPTOmycin (CUBICIN) 1,000 mg in sodium chloride 0.9 % IVPB  Status:  Discontinued     1,000 mg 240 mL/hr over 30 Minutes Intravenous Every other day 12/04/19 1456 12/06/19 0807   11/26/19 1730  DAPTOmycin (CUBICIN) 1,000 mg in sodium chloride 0.9 % IVPB  Status:  Discontinued     1,000 mg 240 mL/hr over 30 Minutes Intravenous Every 24 hours 11/26/19 1642 12/04/19 1456     Subjective: Seen and examined at bedside and he was again asleep when I walked in the room and I woke him up and he states that he is doing a little bit better but was still complaining of pain in his backside.  No chest pain, lightheadedness or dizziness.  States his abdominal pain and right lower quadrant and side tenderness is improved from now.  Happy that his renal function improving..  Objective: Vitals:   12/05/19 2210 12/06/19 0545 12/06/19 0952 12/06/19 1714  BP: 133/72 136/74 (!) 143/73 (!) 168/81  Pulse: (!) 54 99 (!) 54 62  Resp:  18 18 18   Temp:  98.4 F (36.9 C) 97.7 F (36.5 C) 98.2 F (36.8 C)  TempSrc:  Oral Oral Oral  SpO2: 97% 99% 94% 100%  Weight:      Height:        Intake/Output Summary (Last 24 hours) at 12/06/2019 1935 Last data filed at 12/06/2019 1519 Gross per 24 hour  Intake 1371.63 ml  Output 1850 ml  Net -478.37 ml   Filed Weights   12/02/19 1900 12/03/19 2104 12/04/19 2107  Weight: 111.2 kg 111.2 kg 111.2 kg   Examination: Physical Exam:  Constitutional: WN/WD overweight Caucasian male currently in  no acute distress appears calm and comfortable while he is sleeping I woke him up again from sleep is still complaining of some back pain. Eyes: Lids and conjunctivae normal, sclerae anicteric  ENMT: External Ears, Nose appear normal. Grossly normal hearing. Mucous membranes are moist Neck: Appears normal, supple, no cervical masses, normal ROM, no appreciable thyromegaly; no JVD Respiratory: Diminished to auscultation bilaterally with slightly coarse breath sounds, no wheezing, rales, rhonchi or crackles. Normal respiratory effort and patient is not tachypenic. No accessory muscle use.  Unlabored breathing and is not wearing any supplemental oxygen via nasal cannula Cardiovascular: RRR, no murmurs / rubs / gallops. S1 and S2 auscultated. No extremity edema left leg is wrapped again Abdomen: Soft, non-tender, non-distended. Bowel sounds positive x4.  GU: Deferred. Musculoskeletal: No clubbing / cyanosis of digits/nails.  Has a right BKA and his left leg is wrapped in an Ace wrap.  Skin: No rashes on limited skin evaluation but does have foot ulcers on his left foot which are wrapped no induration; Warm and dry.  Neurologic: CN 2-12 grossly intact with no focal deficits. Romberg sign and cerebellar reflexes not assessed.  Psychiatric: Normal judgment and insight.  He was somnolent and drowsy but easily arousable. Normal mood and appropriate affect.   Data Reviewed: I have personally reviewed following labs and imaging studies  CBC: Recent Labs  Lab 12/01/19 0441 12/01/19 0441 12/02/19 0353 12/03/19 0733 12/04/19 0845 12/05/19 0515 12/06/19 0520  WBC 7.7   < > 7.6 8.0 6.6 8.9 5.8  NEUTROABS 5.1  --  4.8 5.6 4.5 6.4  --   HGB 8.9*   < > 8.9* 8.6* 9.4* 8.9* 8.5*  HCT 27.5*   < > 27.6* 27.7* 29.8* 29.0* 27.0*  MCV 96.2   < > 98.2 100.4* 98.3 100.7* 98.9  PLT 192   < > 223 220 235 248 235   < > = values in this interval not displayed.   Basic Metabolic Panel: Recent Labs  Lab  12/01/19 0441 12/01/19 0441 12/02/19 0353 12/03/19 0733 12/04/19 0845 12/05/19 0515 12/06/19 0520  NA 139   < > 137 137 137 139 137  K 4.1   < > 4.3 4.8 5.1 5.8* 5.3*  CL 100   < > 103 105 105 106 102  CO2 24   < > 23 23 22 23 24   GLUCOSE 164*   < > 178* 167* 213* 167* 172*  BUN 30*   < > 33* 37* 38* 38* 37*  CREATININE 2.71*   < > 2.96* 3.20* 3.34* 3.18* 3.01*  CALCIUM 8.9   < > 8.5* 8.7* 8.6* 9.0 8.9  MG 1.7  --  1.8 1.7 1.8 1.9  --   PHOS 4.8*   < > 5.7* 5.6* 4.8* 5.5* 5.7*   < > = values in this interval not displayed.   GFR: Estimated Creatinine Clearance: 33.9 mL/min (A) (by C-G formula based on SCr of 3.01 mg/dL (H)). Liver Function Tests: Recent Labs  Lab 12/01/19 0441 12/01/19 0441 12/02/19 0353 12/03/19 0733 12/04/19 0845 12/05/19 0515 12/06/19 0520  AST 23  --  21 18 19 17   --   ALT 13  --  14 13 12 12   --   ALKPHOS 73  --  74 75 77 76  --   BILITOT 0.2*  --  0.5 0.2* 0.5 0.5  --   PROT 6.7  --  6.7 6.7 6.8 7.0  --   ALBUMIN 2.7*   < > 2.7* 2.6* 2.6* 2.7* 2.5*   < > = values in this interval not displayed.   No results for input(s): LIPASE, AMYLASE in the last 168 hours. No results for input(s): AMMONIA in the last 168 hours. Coagulation Profile: No results for input(s): INR, PROTIME in the last 168 hours. Cardiac Enzymes: Recent Labs  Lab 12/05/19 0515  CKTOTAL 29*   BNP (last 3 results) No results for input(s): PROBNP in the last 8760 hours. HbA1C: No results for input(s): HGBA1C in the last 72 hours. CBG: Recent Labs  Lab 12/05/19 1637 12/05/19 2113 12/06/19 0702 12/06/19 1135 12/06/19 1643  GLUCAP 150* 229* 151* 139* 160*   Lipid Profile: No results for input(s): CHOL, HDL, LDLCALC, TRIG, CHOLHDL, LDLDIRECT in the last 72 hours. Thyroid Function Tests: No results for input(s): TSH, T4TOTAL, FREET4, T3FREE, THYROIDAB in the last 72 hours. Anemia Panel: No results for input(s): VITAMINB12, FOLATE, FERRITIN, TIBC, IRON, RETICCTPCT in  the last 72 hours. Sepsis Labs: No results for input(s): PROCALCITON, LATICACIDVEN in the last 168 hours.  Recent Results (from the past 240 hour(s))  Culture, Urine     Status: Abnormal   Collection Time: 12/03/19  4:49 PM   Specimen: Urine, Random  Result Value Ref Range Status   Specimen Description URINE, RANDOM  Final   Special Requests   Final    NONE Performed at [facility], [address], [place], [place] [address]    Culture MULTIPLE SPECIES PRESENT, SUGGEST RECOLLECTION (A)  Final   Report Status 12/04/2019 FINAL  Final  Culture, blood (routine x 2)     Status: None (Preliminary result)   Collection Time: 12/04/19 11:16 AM   Specimen: BLOOD  Result Value Ref Range Status   Specimen Description BLOOD RIGHT ANTECUBITAL  Final   Special Requests   Final    BOTTLES DRAWN AEROBIC AND ANAEROBIC Blood Culture adequate volume   Culture   Final    NO GROWTH 2 DAYS Performed at [facility], [place] [address], [place], [place] [address]    Report Status PENDING  Incomplete  Culture, blood (routine x 2)     Status: None (Preliminary result)   Collection Time: 12/04/19 11:39 AM   Specimen: BLOOD  Result Value Ref Range Status   Specimen Description BLOOD LEFT ANTECUBITAL  Final   Special Requests   Final    BOTTLES DRAWN AEROBIC AND ANAEROBIC Blood Culture adequate volume   Culture   Final    NO  GROWTH 2 DAYS Performed at [facility], [place] [address], [place], [place] [address]    Report Status PENDING  Incomplete     RN Pressure Injury Documentation: Pressure Injury 11/24/19 Heel Left Unstageable - Full thickness tissue loss in which the base of the injury is covered by slough (yellow, tan, gray, green or brown) and/or eschar (tan, brown or black) in the wound bed. (Active)  11/24/19 0655  Location: Heel  Location Orientation: Left  Staging: Unstageable - Full thickness tissue loss in which the base of the injury is covered by slough (yellow, tan, gray,  green or brown) and/or eschar (tan, brown or black) in the wound bed.  Wound Description (Comments):   Present on Admission: Yes     Pressure Injury 09/02/19 Foot Left Unstageable - Full thickness tissue loss in which the base of the ulcer is covered by slough (yellow, tan, gray, green or brown) and/or eschar (tan, brown or black) in the wound bed. (Active)  09/02/19 0815  Location: Foot  Location Orientation: Left  Staging: Unstageable - Full thickness tissue loss in which the base of the ulcer is covered by slough (yellow, tan, gray, green or brown) and/or eschar (tan, brown or black) in the wound bed.  Wound Description (Comments):   Present on Admission: Yes     Estimated body mass index is 28.33 kg/m as calculated from the following:   Height as of this encounter: 6\' 6"  (1.981 m).   Weight as of this encounter: 111.2 kg.  Malnutrition Type:  Nutrition Problem: Increased nutrient needs Etiology: wound healing   Malnutrition Characteristics:  Signs/Symptoms: estimated needs   Nutrition Interventions:  Interventions: Boost Breeze, Juven   Radiology Studies: No results found.  Scheduled Meds: . allopurinol  100 mg Oral BID  . amLODipine  10 mg Oral Daily  . Chlorhexidine Gluconate Cloth  6 each Topical Daily  . cloNIDine  0.3 mg Oral TID  . feeding supplement (ENSURE ENLIVE)  237 mL Oral BID BM  . finasteride  5 mg Oral Daily  . heparin injection (subcutaneous)  5,000 Units Subcutaneous Q8H  . hydrALAZINE  50 mg Oral Q8H  . insulin aspart  0-15 Units Subcutaneous TID WC  . insulin aspart  0-5 Units Subcutaneous QHS  . levothyroxine  50 mcg Oral Q0600  . lidocaine  2 patch Transdermal Q24H  . lidocaine-prilocaine   Topical Daily  . metoprolol tartrate  50 mg Oral BID  . nutrition supplement (JUVEN)  1 packet Oral BID BM  . oxyCODONE  30 mg Oral TID  . pantoprazole  40 mg Oral Daily  . sodium chloride flush  3 mL Intravenous Q12H  . sodium zirconium cyclosilicate   10 g Oral Daily  . tamsulosin  0.4 mg Oral Daily   Continuous Infusions: . sodium chloride Stopped (11/27/19 2024)  . sodium chloride 75 mL/hr at 12/06/19 1337  . DAPTOmycin (CUBICIN)  IV      LOS: 12 days   Kerney Elbe, DO [facility] PAGER is on AMION  If 7PM-7AM, please contact night-coverage [email or web address]

## 2019-12-06 NOTE — Progress Notes (Signed)
Pharmacy Antibiotic Note  Thomas [name] is a 69 y.o. 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 Was last on daptomycin 08/31/19 through 10/09/19 then discharged on dalbavancin weekly until around 10/27/19. Pharmacy has been consulted for daptomycin dosing. MRI this admission found progressive osteomyelitis and persistent cellulitis and abscess at T10-11 with probable epidural abscess. IR aspirate from this admission is no growth final.  Noted that patient had a bump in Scr over the weekend up to 3.34 yesterday. Scr now continues to improve and is down to 3.01 with CrCl ~ 33 ml/min.   Plan: Adjust daptomycin to 1000 mg every 24 hours  Monitor CK every Monday Monitor cultures/sensitivities, renal function, and clinical progression Planned stop date 01/20/20  Height: 6\' 6"  (198.1 cm) Weight: 245 lb 2.8 oz (111.2 kg) IBW/kg (Calculated) : 91.4  Temp (24hrs), Avg:98.1 F (36.7 C), Min:97.9 F (36.6 C), Max:98.4 F (36.9 C)  Recent Labs  Lab 12/02/19 0353 12/03/19 0733 12/04/19 0845 12/05/19 0515 12/06/19 0520  WBC 7.6 8.0 6.6 8.9 5.8  CREATININE 2.96* 3.20* 3.34* 3.18* 3.01*    Estimated Creatinine Clearance: 33.9 mL/min (A) (by C-G formula based on SCr of 3.01 mg/dL (H)).     Antimicrobials this admission: Daptomycin 2/6 >>    Microbiology results: 2/4 BCx: ngt 2/4 MRSA PCR: neg 2/6 epidural abscess: neg   Jimmy Footman, PharmD, BCPS, BCIDP Infectious Diseases Clinical Pharmacist Phone: [phone] Please check AMION for all [facility] phone numbers After 10:00 PM, call [place] [phone]

## 2019-12-06 NOTE — Plan of Care (Signed)
  Problem: Education: Goal: Knowledge of General Education information will improve Description Including pain rating scale, medication(s)/side effects and non-pharmacologic comfort measures Outcome: Progressing   

## 2019-12-07 LAB — CBC WITH DIFFERENTIAL/PLATELET
Abs Immature Granulocytes: 0.03 10*3/uL (ref 0.00–0.07)
Basophils Absolute: 0 10*3/uL (ref 0.0–0.1)
Basophils Relative: 0 %
Eosinophils Absolute: 0.4 10*3/uL (ref 0.0–0.5)
Eosinophils Relative: 6 %
HCT: 27.8 % — ABNORMAL LOW (ref 39.0–52.0)
Hemoglobin: 8.9 g/dL — ABNORMAL LOW (ref 13.0–17.0)
Immature Granulocytes: 1 %
Lymphocytes Relative: 25 %
Lymphs Abs: 1.5 10*3/uL (ref 0.7–4.0)
MCH: 31.7 pg (ref 26.0–34.0)
MCHC: 32 g/dL (ref 30.0–36.0)
MCV: 98.9 fL (ref 80.0–100.0)
Monocytes Absolute: 0.4 10*3/uL (ref 0.1–1.0)
Monocytes Relative: 7 %
Neutro Abs: 3.7 10*3/uL (ref 1.7–7.7)
Neutrophils Relative %: 61 %
Platelets: 259 10*3/uL (ref 150–400)
RBC: 2.81 MIL/uL — ABNORMAL LOW (ref 4.22–5.81)
RDW: 13.9 % (ref 11.5–15.5)
WBC: 6 10*3/uL (ref 4.0–10.5)
nRBC: 0 % (ref 0.0–0.2)

## 2019-12-07 LAB — COMPREHENSIVE METABOLIC PANEL
ALT: 13 U/L (ref 0–44)
AST: 18 U/L (ref 15–41)
Albumin: 2.7 g/dL — ABNORMAL LOW (ref 3.5–5.0)
Alkaline Phosphatase: 80 U/L (ref 38–126)
Anion gap: 8 (ref 5–15)
BUN: 40 mg/dL — ABNORMAL HIGH (ref 8–23)
CO2: 21 mmol/L — ABNORMAL LOW (ref 22–32)
Calcium: 9 mg/dL (ref 8.9–10.3)
Chloride: 106 mmol/L (ref 98–111)
Creatinine, Ser: 2.91 mg/dL — ABNORMAL HIGH (ref 0.61–1.24)
GFR calc Af Amer: 25 mL/min — ABNORMAL LOW (ref >60)
GFR calc non Af Amer: 21 mL/min — ABNORMAL LOW (ref >60)
Glucose, Bld: 146 mg/dL — ABNORMAL HIGH (ref 70–99)
Potassium: 5.3 mmol/L — ABNORMAL HIGH (ref 3.5–5.1)
Sodium: 135 mmol/L (ref 135–145)
Total Bilirubin: 0.5 mg/dL (ref 0.3–1.2)
Total Protein: 6.8 g/dL (ref 6.5–8.1)

## 2019-12-07 LAB — GLUCOSE, CAPILLARY
Glucose-Capillary: 161 mg/dL — ABNORMAL HIGH (ref 70–99)
Glucose-Capillary: 137 mg/dL — ABNORMAL HIGH (ref 70–99)
Glucose-Capillary: 130 mg/dL — ABNORMAL HIGH (ref 70–99)
Glucose-Capillary: 143 mg/dL — ABNORMAL HIGH (ref 70–99)

## 2019-12-07 LAB — PHOSPHORUS: Phosphorus: 5.4 mg/dL — ABNORMAL HIGH (ref 2.5–4.6)

## 2019-12-07 LAB — FERRITIN: Ferritin: 141 ng/mL (ref 24–336)

## 2019-12-07 LAB — MAGNESIUM: Magnesium: 1.6 mg/dL — ABNORMAL LOW (ref 1.7–2.4)

## 2019-12-07 LAB — IRON AND TIBC
Iron: 79 ug/dL (ref 45–182)
Saturation Ratios: 34 % (ref 17.9–39.5)
TIBC: 230 ug/dL — ABNORMAL LOW (ref 250–450)
UIBC: 151 ug/dL

## 2019-12-07 MED ORDER — FUROSEMIDE 40 MG PO TABS
40.0000 mg | ORAL_TABLET | Freq: Every day | ORAL | Status: DC
  Administered 2019-12-07 – 2019-12-11 (×5): 40 mg via ORAL
  Filled 2019-12-07 (×5): qty 1

## 2019-12-07 NOTE — Plan of Care (Signed)
  Problem: Education: Goal: Knowledge of General Education information will improve Description: Including pain rating scale, medication(s)/side effects and non-pharmacologic comfort measures Outcome: Progressing   Problem: Activity: Goal: Risk for activity intolerance will decrease Outcome: Progressing   Problem: Skin Integrity: Goal: Risk for impaired skin integrity will decrease Outcome: Progressing   

## 2019-12-07 NOTE — Progress Notes (Signed)
Nutrition Follow up  DOCUMENTATION CODES:   Not applicable  INTERVENTION:    Ensure Enlive po BID, each supplement provides 350 kcal and 20 grams of protein  Juven Fruit Punch BID, each serving provides 95kcal and 2.5g of protein (amino acids glutamine and arginine)  NUTRITION DIAGNOSIS:   Increased nutrient needs related to wound healing as evidenced by estimated needs.  Ongoing  GOAL:   Patient will meet greater than or equal to 90% of their needs   Progressing   MONITOR:   Diet advancement, PO intake, Supplement acceptance, Weight trends, Labs, I & O's, Skin  REASON FOR ASSESSMENT:   Consult Wound healing  ASSESSMENT:   Patient with PMH significant for CVA, HTN, HLD, DM, DM, PAD s/p R BKA, and chronic back pain. Presents this admission with hematemesis, diskitis, and LLE wounds.   Appetite remains stable. Meal completions charted as 50-100% for his last 6 meals (83% average). Drinking Juven and Ensure BID. Potassium trending up. Started on lokelma. Will monitor trends and make supplement adjustments accordingly.   Admission weight: 109.1 kg  Current weight: 111.2 kg   I/O: -2,368 ml since admit UOP: 2, 850 ml x 24 hrs   Medications: SS novolog, 40 mg lasix daily, 10 g lokelma daily  Labs: K 5.3 (H) Phosphorus 5.4 (H) Mg 1.6 (L) CBG 137-229  Diet Order:   Diet Order            Diet regular Room service appropriate? Yes; Fluid consistency: Thin  Diet effective now              EDUCATION NEEDS:   Not appropriate for education at this time  Skin:  Skin Assessment: Skin Integrity Issues: Skin Integrity Issues:: Unstageable Unstageable: L heel  Last BM:  2/17  Height:   Ht Readings from Last 1 Encounters:  11/24/19 6\' 6"  (1.981 m)    Weight:   Wt Readings from Last 1 Encounters:  12/04/19 111.2 kg    Ideal Body Weight:  91 kg(adjusted R BKA)  BMI:  Body mass index is 28.33 kg/m.  Estimated Nutritional Needs:   Kcal:  2400-2600  kcal  Protein:  120-135 grams  Fluid:  >/= 2.4 L/day   Mariana Single RD, LDN Clinical Nutrition Pager listed in [place]

## 2019-12-07 NOTE — Progress Notes (Signed)
Patient ID: Thomas [name], male   DOB: [birth year], 69 y.o.   MRN: [id number] S: No new complaints O:BP (!) 146/80 (BP Location: Left Arm)   Pulse 66   Temp 98.6 F (37 C) (Oral)   Resp 18   Ht 6\' 6"  (1.981 m)   Wt 111.2 kg   SpO2 98%   BMI 28.33 kg/m   Intake/Output Summary (Last 24 hours) at 12/07/2019 1430 Last data filed at 12/07/2019 0953 Gross per 24 hour  Intake 3774.4 ml  Output 2851 ml  Net 923.4 ml   Intake/Output: I/O last 3 completed shifts: In: [id number] [P.O.:1820; I.V.:2843; IV Piggyback:120] Out: [id number] [Urine:4050; Stool:1]  Intake/Output this shift:  Total I/O In: 363 [P.O.:360; Other:3] Out: 0  Weight change:  Gen: NAD CVS: no rub Resp: cta Abd: +BS, soft, Nt/Nd Ext: s/p right BKA, no edema  Recent Labs  Lab 12/01/19 0441 12/02/19 0353 12/03/19 0733 12/04/19 0845 12/05/19 0515 12/06/19 0520 12/07/19 0539  NA 139 137 137 137 139 137 135  K 4.1 4.3 4.8 5.1 5.8* 5.3* 5.3*  CL 100 103 105 105 106 102 106  CO2 24 23 23 22 23 24  21*  GLUCOSE 164* 178* 167* 213* 167* 172* 146*  BUN 30* 33* 37* 38* 38* 37* 40*  CREATININE 2.71* 2.96* 3.20* 3.34* 3.18* 3.01* 2.91*  ALBUMIN 2.7* 2.7* 2.6* 2.6* 2.7* 2.5* 2.7*  CALCIUM 8.9 8.5* 8.7* 8.6* 9.0 8.9 9.0  PHOS 4.8* 5.7* 5.6* 4.8* 5.5* 5.7* 5.4*  AST 23 21 18 19 17   --  18  ALT 13 14 13 12 12   --  13   Liver Function Tests: Recent Labs  Lab 12/04/19 0845 12/04/19 0845 12/05/19 0515 12/06/19 0520 12/07/19 0539  AST 19  --  17  --  18  ALT 12  --  12  --  13  ALKPHOS 77  --  76  --  80  BILITOT 0.5  --  0.5  --  0.5  PROT 6.8  --  7.0  --  6.8  ALBUMIN 2.6*   < > 2.7* 2.5* 2.7*   < > = values in this interval not displayed.   No results for input(s): LIPASE, AMYLASE in the last 168 hours. No results for input(s): AMMONIA in the last 168 hours. CBC: Recent Labs  Lab 12/03/19 0733 12/03/19 0733 12/04/19 0845 12/04/19 0845 12/05/19 0515 12/06/19 0520 12/07/19 0539  WBC 8.0   < > 6.6   < > 8.9 5.8  6.0  NEUTROABS 5.6   < > 4.5  --  6.4  --  3.7  HGB 8.6*   < > 9.4*   < > 8.9* 8.5* 8.9*  HCT 27.7*   < > 29.8*   < > 29.0* 27.0* 27.8*  MCV 100.4*  --  98.3  --  100.7* 98.9 98.9  PLT 220   < > 235   < > 248 235 259   < > = values in this interval not displayed.   Cardiac Enzymes: Recent Labs  Lab 12/05/19 0515  CKTOTAL 29*   CBG: Recent Labs  Lab 12/06/19 1135 12/06/19 1643 12/06/19 2126 12/07/19 0741 12/07/19 1133  GLUCAP 139* 160* 137* 161* 137*    Iron Studies:  Recent Labs    12/07/19 0539  IRON 79  TIBC 230*  FERRITIN 141   Studies/Results: No results found. Marland Kitchen allopurinol  100 mg Oral BID  . amLODipine  10 mg Oral Daily  . Chlorhexidine  Gluconate Cloth  6 each Topical Daily  . cloNIDine  0.3 mg Oral TID  . feeding supplement (ENSURE ENLIVE)  237 mL Oral BID BM  . finasteride  5 mg Oral Daily  . heparin injection (subcutaneous)  5,000 Units Subcutaneous Q8H  . hydrALAZINE  50 mg Oral Q8H  . insulin aspart  0-15 Units Subcutaneous TID WC  . insulin aspart  0-5 Units Subcutaneous QHS  . levothyroxine  50 mcg Oral Q0600  . lidocaine  2 patch Transdermal Q24H  . lidocaine-prilocaine   Topical Daily  . metoprolol tartrate  50 mg Oral BID  . nutrition supplement (JUVEN)  1 packet Oral BID BM  . oxyCODONE  30 mg Oral TID  . pantoprazole  40 mg Oral Daily  . sodium chloride flush  3 mL Intravenous Q12H  . sodium zirconium cyclosilicate  10 g Oral Daily  . tamsulosin  0.4 mg Oral Daily    BMET    Component Value Date/Time   NA 135 12/07/2019 0539   K 5.3 (H) 12/07/2019 0539   CL 106 12/07/2019 0539   CO2 21 (L) 12/07/2019 0539   GLUCOSE 146 (H) 12/07/2019 0539   BUN 40 (H) 12/07/2019 0539   CREATININE 2.91 (H) 12/07/2019 0539   CALCIUM 9.0 12/07/2019 0539   GFRNONAA 21 (L) 12/07/2019 0539   GFRAA 25 (L) 12/07/2019 0539   CBC    Component Value Date/Time   WBC 6.0 12/07/2019 0539   RBC 2.81 (L) 12/07/2019 0539   HGB 8.9 (L) 12/07/2019 0539    HCT 27.8 (L) 12/07/2019 0539   PLT 259 12/07/2019 0539   MCV 98.9 12/07/2019 0539   MCH 31.7 12/07/2019 0539   MCHC 32.0 12/07/2019 0539   RDW 13.9 12/07/2019 0539   LYMPHSABS 1.5 12/07/2019 0539   MONOABS 0.4 12/07/2019 0539   EOSABS 0.4 12/07/2019 0539   BASOSABS 0.0 12/07/2019 0539   Assessment/Plan:  1. AKI/CKD stage IV- presumably due to volume depletion +/- obstructive uropathy. BUN/CR improving with IVF's and foley catheter.  1. Scr back at his baseline.  2. Decrease IVF's to 50 ml/hr 3. Will need to follow up with his primary nephrologist in [facility] after discharge. 2. Hyperkalemia- improving with with lokelma.  Continue to follow and renal diet.   1. Will restart lasix but at lower dose than his home meds (was on 80 mg daily will start 40 mg and follow). 3. Epidural abscess- s/p drain per IR on 11/26/19. daptomycin per ID 4. MRSA discitis/bacteremia- as above completed tx in December 2020 5. HTN- stable 6. BPH- on flomax and foley in place. 7. Anemia acute on chronic due to CKD and acute infection.  Will check iron stores and follow.  Donetta Potts, MD [employer] [phone]

## 2019-12-07 NOTE — Progress Notes (Signed)
PROGRESS NOTE  Thomas [name]  DOB: November 09, 1950  PCP: Antonietta Jewel, MD [id number]  DOA: 11/24/2019 Admitted From: Home  LOS: 13 days   No chief complaint on file.  Brief narrative: The patient is a 69 y.o.malewith h/oCVA, HTN, hyperlipidemia,Gout,DM, CBP,PAD,s/pright BKA, chronic anemia, who was admitted on 1/31 to [facility] a fall with inability to get up on his own- prolonged down time. He was found to have a LLE wound/cellulitis with concern for outpatient treatment failure with Clindamycin. He was treated with IV Dapto with plan to transition to Linezolid at d/c (due to h/o MRSA bacteremia/diskitis in Nov 2020).  On the evening of 2/3, he developed abdominal pain with n/v with coffee ground emesis that was gastroccult positive and so he was transferred to [facility] for GI evaluation.  Patient also c/o severe back pain. He was also to have epidural abscess likely from his previous MRSA infection. Underwent IR guided drainage. Currently plan isIV Daptomycinprolonged abxtreatment.  2/12- CT of the abdomen pelvis without contrast showed persistent destructive changes from T10/T11 discitis and osteomyelitis but no other acute intra-abdominal and intrapelvic processes and did mention that the bladder was unremarkable and adrenals were normal.  Subjective: Patient was seen and examined this morning.  Pleasant elderly Caucasian male.  Lying down in bed.  Not in distress.  No new symptoms.  On IV daptomycin.  Assessment/Plan:  T10-T11 Discitis with Epidural Abscess, MRSA bacteremia -He was previously treated at [facility] from 11/11-12/20 for T10-11 discitis and MRSA bacteremia. -Daptomycin was transitioned to [place] at the time of d/c to home.  -He was discharged with home health. -MRI this admission shows evidence of epidural abscess. Neuro surgery, Infectious disease consulted.  -Now S/PIR guided drainage of the epidural abscess (on 11/26/2019, cx -ve so far). -Started  back on IV daptomycin 2/6 after discussion with ID  -Infectious diseases recommends 8 weeks of IV daptomycin and have the patient arrange for hospital follow-up; Daptomycin had been changed to every other day dosing given his worsened renal function but needs to be adjusted based on renal function  -Appreciate their f/u--they recommend central line and transition to OPAT when stable for discharge (when off IV pain meds) and Discharge Disposition secure (CIR vs. SNF) -OOB to Chair if able  Hematemesis Macrocytic Anemia/Anemia of Chronic Kidney Disease  -Hemoglobin stable between 8-9. -S/P upper GI endoscopy by Dr. Benson Norway. -No evidence of acute abnormality. -Continue PPI patient is back on regular diet. Avoid NSAIDs. -DVT prophylaxis with heparin resumed  Chronic Pain Syndrome -Patient is on chronic oxycodone 30 mg 3 times daily as well as Tizanidine 2 m po q8h -Now, additionally, he is on Oxycodone 10 mg every 4 hours PRN and Dilaudid 1 mg IV  q6hfor severe pain -AddedLidocaine patch and claims does not help; have added EMLA cream now -Watch for drug seeking behavior and Judicious use of IV Narcotics -Continues to complain of some pain    BPH Acute Urinary Retention -Continue Tamsulosin 0.4 mg po Daily and Finasteride 5 mg po Daily  -Patient was retaining urine so Foley catheter was placed and drained 775 mL immediately -Bladder scan showed the patient has significant amount of urinary retention -Renal ultrasound done and showed "Negative for hydronephrosis. Right kidney smaller than the left with diffuse cortical thinning, consistent with atrophy. Suggestion of increased cortical echogenicity as may be seen with medical renal disease." -C/w Foley Catheter Drainage; Likely will need Urology follow up at D/C   Suspected UTI -Initial UA showed Turbid  Appearance with Small Blood, Large Leukocytes, No Bacteria, 21-50 RBC/HPF, and >50 WBC -Repeat after Foley was placed showed cloudy  appearance, moderate hemoglobin, large leukocytes, many bacteria, greater than 50 RBCs per high-power field, and 21-50 WBCs -Urine culture showed Multiple Species -blood cultures x2 and these are NGTD at 2 Day  -Likely could be gram-negative rods causing his symptoms but Infectious Diseases does not feel that he clinically does not have a UTI so will hold off on GNR Treatment    Acute Kidney Injury on Chronic kidney disease stage IV, worsening  Hyperphosphatemia Hyperkalemia -Renal function started worsening and BUN/Cr now went from 28/2.26 -> 30/2.71 -> 33/2.96 -> 37/3.20 -> 38/3.34 -> 38/3.18 -> 37/3/01 -Patient's Phos Level went from 4.8 -> 5.7 -> 5.6 -> 4.8 -> 5.5 -> 5.7 -K+ was 5.3  and given Lokelma again by Nephrology  -Avoid nephrotoxic medications, contrast dyes, hypotension and renally adjust medications -Patient's BUN/creatinine had improved from admission down to 28/2.26 and is now is as above -Started IVF hydration with NS at 75 mL/hr per nephrology and this is been reduced down to 50 MLS per hour -Checked Bladder Scan and he was significantly retaining so Foley catheter was placed -UA showed Turbid Appearance with Small Blood, Large Leukocytes, No Bacteria, 21-50 RBC/HPF, and >50 WBC -Urine Chloride was 84, Urine Osm was 410, and Urine Sodium was 102 -per nephrology once his serum creatinine stabilizes will likely need to restart his Lasix as 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 -Continue home regimen with Amlodipine 10 mg po Daily, Clonidine  0.3 mg TID, Finasteride 5 mg po Daily, Hydralazine 50 mg po q8h, and Metoprolol Tartrate 50 mg po BID  -C/w Labetalol 10 mg q2hprn For SBP>160 -Patient's BP was 168/81  Left heel and foot unstageable ulcer. -POA.Dressing changes per wound care -S/p right BKA -LeftABI pending (ordered 2/4) and done yesterday AM and showed "Left: Resting left ankle-brachial index indicates noncompressible left lower extremity arteries." -Currently on IV antibiotics with Daptomycin. -Can follow up with Vascular in the outpatient setting   History of Gout -Continue Allopurinol 100 mg po BID   Hypothyroidism -C/w Levothyroxine 50 mcg po Daily   DVT prophylaxis: Heparin 5,000 units sq q8h  Code Status: FULL CODE Family Communication: No family present at bedside  Disposition Plan: CIR vs. SNF if patient is able to be weaned off of IV narcotics.  PT OT recommending CIR but currently not a candidate at this time as he is not meeting the needs per CIR coordinator.  Will need central access for long-term antibiotic usage for 8 weeks and go to SNF; Currently Renal Fxn has worsened and complaining of worsening Right Sided Abdominal/Flank Pain and now has Acute Urinary Retention along with AKI on CKD so Nephrology is following   Consultants:   Gastroenterology  Infectious Diseases   Nephrology    Procedures: None  Antimicrobials: Anti-infectives (From admission, onward)   Start     Dose/Rate Route Frequency Ordered Stop   12/06/19 2000  DAPTOmycin (CUBICIN) 1,000 mg in sodium chloride 0.9 % IVPB     1,000 mg 240 mL/hr over 30 Minutes Intravenous Daily 12/06/19  [id number] 01/20/20 2359   12/05/19 1000  DAPTOmycin (CUBICIN) 1,000 mg in sodium chloride 0.9 % IVPB  Status:  Discontinued     1,000 mg 240 mL/hr over 30 Minutes Intravenous Every other day 12/04/19 1456 12/06/19 0807   11/26/19 1730  DAPTOmycin (CUBICIN) 1,000 mg in sodium chloride 0.9 % IVPB  Status:  Discontinued     1,000 mg 240 mL/hr over  30 Minutes Intravenous Every 24 hours 11/26/19 1642 12/04/19 1456        Code Status: Full Code   Diet Order            Diet regular Room service appropriate? Yes; Fluid consistency: Thin  Diet effective now              Infusions:  . sodium chloride Stopped (11/27/19 2024)  . sodium chloride 75 mL/hr at 12/07/19 0210  . DAPTOmycin (CUBICIN)  IV Stopped (12/06/19 2142)    Scheduled Meds: . allopurinol  100 mg Oral BID  . amLODipine  10 mg Oral Daily  . Chlorhexidine Gluconate Cloth  6 each Topical Daily  . cloNIDine  0.3 mg Oral TID  . feeding supplement (ENSURE ENLIVE)  237 mL Oral BID BM  . finasteride  5 mg Oral Daily  . heparin injection (subcutaneous)  5,000 Units Subcutaneous Q8H  . hydrALAZINE  50 mg Oral Q8H  . insulin aspart  0-15 Units Subcutaneous TID WC  . insulin aspart  0-5 Units Subcutaneous QHS  . levothyroxine  50 mcg Oral Q0600  . lidocaine  2 patch Transdermal Q24H  . lidocaine-prilocaine   Topical Daily  . metoprolol tartrate  50 mg Oral BID  . nutrition supplement (JUVEN)  1 packet Oral BID BM  . oxyCODONE  30 mg Oral TID  . pantoprazole  40 mg Oral Daily  . sodium chloride flush  3 mL Intravenous Q12H  . sodium zirconium cyclosilicate  10 g Oral Daily  . tamsulosin  0.4 mg Oral Daily    PRN meds: sodium chloride, acetaminophen **OR** acetaminophen, bisacodyl, HYDROmorphone (DILAUDID) injection, labetalol, ondansetron **OR** ondansetron (ZOFRAN) IV, oxyCODONE, tiZANidine   Objective: Vitals:   12/07/19 0448 12/07/19 0944  BP: (!) 155/84 (!) 146/80  Pulse: 62 66  Resp: 18 18  Temp: 98.2 F (36.8 C) 98.6 F (37 C)  SpO2: 99% 98%    Intake/Output Summary (Last 24 hours) at 12/07/2019 1133 Last data filed at 12/07/2019 0953 Gross per 24 hour  Intake 3974.4 ml  Output 2851 ml  Net 1123.4 ml   Filed Weights   12/02/19 1900 12/03/19 2104 12/04/19 2107  Weight: 111.2 kg 111.2 kg 111.2 kg   Weight change:  Body mass index is 28.33  kg/m.   Physical Exam: General exam: Appears calm and comfortable.  Lying on bed.  Not in distress. Skin: No rashes, lesions or ulcers. HEENT: Atraumatic, normocephalic, supple neck, no obvious bleeding Lungs: Clear to auscultation bilaterally CVS: Regular rate and rhythm, no murmur GI/Abd soft, nontender, nondistended, bowel sound present CNS: Alert, awake oriented x3 Psychiatry: Mood appropriate Extremities: Right BKA status, left leg with bandage wound.  Gouty changes left foot  Data Review: I have personally reviewed the laboratory data and studies available.  Recent Labs  Lab 12/02/19 0353 12/02/19 0353 12/03/19 0733 12/04/19 0845 12/05/19 0515 12/06/19 0520 12/07/19 0539  WBC 7.6   < > 8.0 6.6 8.9 5.8 6.0  NEUTROABS 4.8  --  5.6 4.5 6.4  --  3.7  HGB 8.9*   < >  8.6* 9.4* 8.9* 8.5* 8.9*  HCT 27.6*   < > 27.7* 29.8* 29.0* 27.0* 27.8*  MCV 98.2   < > 100.4* 98.3 100.7* 98.9 98.9  PLT 223   < > 220 235 248 235 259   < > = values in this interval not displayed.   Recent Labs  Lab 12/02/19 0353 12/02/19 0353 12/03/19 0733 12/04/19 0845 12/05/19 0515 12/06/19 0520 12/07/19 0539  NA 137   < > 137 137 139 137 135  K 4.3   < > 4.8 5.1 5.8* 5.3* 5.3*  CL 103   < > 105 105 106 102 106  CO2 23   < > 23 22 23 24  21*  GLUCOSE 178*   < > 167* 213* 167* 172* 146*  BUN 33*   < > 37* 38* 38* 37* 40*  CREATININE 2.96*   < > 3.20* 3.34* 3.18* 3.01* 2.91*  CALCIUM 8.5*   < > 8.7* 8.6* 9.0 8.9 9.0  MG 1.8  --  1.7 1.8 1.9  --  1.6*  PHOS 5.7*   < > 5.6* 4.8* 5.5* 5.7* 5.4*   < > = values in this interval not displayed.    Terrilee Croak, MD  [facility] 12/07/2019

## 2019-12-07 NOTE — Plan of Care (Signed)
  Problem: Education: Goal: Knowledge of General Education information will improve Description Including pain rating scale, medication(s)/side effects and non-pharmacologic comfort measures Outcome: Progressing   

## 2019-12-08 LAB — RENAL FUNCTION PANEL
Albumin: 2.8 g/dL — ABNORMAL LOW (ref 3.5–5.0)
Anion gap: 11 (ref 5–15)
BUN: 41 mg/dL — ABNORMAL HIGH (ref 8–23)
CO2: 22 mmol/L (ref 22–32)
Calcium: 9.2 mg/dL (ref 8.9–10.3)
Chloride: 101 mmol/L (ref 98–111)
Creatinine, Ser: 2.86 mg/dL — ABNORMAL HIGH (ref 0.61–1.24)
GFR calc Af Amer: 25 mL/min — ABNORMAL LOW (ref >60)
GFR calc non Af Amer: 22 mL/min — ABNORMAL LOW (ref >60)
Glucose, Bld: 143 mg/dL — ABNORMAL HIGH (ref 70–99)
Phosphorus: 5.7 mg/dL — ABNORMAL HIGH (ref 2.5–4.6)
Potassium: 5.2 mmol/L — ABNORMAL HIGH (ref 3.5–5.1)
Sodium: 134 mmol/L — ABNORMAL LOW (ref 135–145)

## 2019-12-08 LAB — GLUCOSE, CAPILLARY
Glucose-Capillary: 145 mg/dL — ABNORMAL HIGH (ref 70–99)
Glucose-Capillary: 137 mg/dL — ABNORMAL HIGH (ref 70–99)
Glucose-Capillary: 169 mg/dL — ABNORMAL HIGH (ref 70–99)
Glucose-Capillary: 184 mg/dL — ABNORMAL HIGH (ref 70–99)

## 2019-12-08 NOTE — Plan of Care (Signed)
  Problem: Activity: Goal: Risk for activity intolerance will decrease Outcome: Progressing   

## 2019-12-08 NOTE — Progress Notes (Signed)
PROGRESS NOTE  Thomas [name]  DOB: 23-Aug-1951  PCP: Antonietta Jewel, MD [id number]  DOA: 11/24/2019 Admitted From: Home  LOS: 14 days   No chief complaint on file.  Brief narrative: The patient is a 68 y.o.malewith h/oCVA, HTN, hyperlipidemia,Gout,DM, CBP,PAD,s/pright BKA, chronic anemia, who was admitted on 1/31 to [facility] a fall with inability to get up on his own- prolonged down time. He was found to have a LLE wound/cellulitis with concern for outpatient treatment failure with Clindamycin. He was treated with IV Dapto with planned to transition to Linezolid at d/c (due to h/o MRSA bacteremia/diskitis in Nov 2020).  On the evening of 2/3, he developed abdominal pain with n/v with coffee ground emesis that was gastroccult positive and so he was transferred to [facility] for GI evaluation.   During this hospitalization, patient also c/o severe back pain. 2/5, lumbar spine MRI showed progressive osteomyelitis and persistent paraspinal cellulitis and abscess at T10-11 with an epidural abscess.  11/26/19- Patient underwent IR guided drainage.  Cultures negative.  Currently plan isIV Daptomycinand prolonged oral antibiotics afterwards.  2/12- CT of the abdomen pelvis without contrast showed persistent destructive changes from T10/T11 discitis and osteomyelitis but no other acute intra-abdominal and intrapelvic processes and did mention that the bladder was unremarkable and adrenals were normal.  Subjective: Patient was seen and examined this morning.  Pleasant elderly Caucasian male.  Lying down in bed.  Not in distress.  No new symptoms.  On IV daptomycin. No essential change in last 24 hours.  Assessment/Plan: T10-T11 Discitis with Epidural Abscess, MRSA bacteremia -He was previously treated at [facility] from 11/11-12/20 for T10-11 discitis and MRSA bacteremia. -Daptomycin was transitioned to [place]in at the time of d/c to home.  -He was discharged with home health. -MRI  this admission shows evidence of epidural abscess. Neuro surgery, Infectious disease were consulted.  -s/pIR guided drainage of the epidural abscess -Infectious diseases recommends 8 weeks of IV daptomycin (till 01/20/20).  Pharmacy to renally dose.    Upper GI bleeding/hematemesis Macrocytic Anemia/Anemia of Chronic Kidney Disease  -Hemoglobin stable between 8-9. -S/P upper GI endoscopy by Dr. Benson Norway. -No evidence of acute abnormality. -Continue PPI patient is back on regular diet. Avoid NSAIDs. -DVT prophylaxis with heparin resumed  Chronic Pain Syndrome -On multiple pain medications.  Needs to be adjusted to avoid opiate tolerance.    BPH Acute Urinary Retention -Continue Tamsulosin 0.4 mg po Daily and Finasteride 5 mg po Daily  -Patient was retaining urine so Foley catheter was placed and drained 775 mL immediately -Bladder scan showed that patient had significant amount of urinary retention -Renal ultrasound did not show hydronephrosis.   -Continue Foley Catheter Drainage; Likely will need Urology follow up at D/C   Suspected UTI -Initial UA showed Turbid Appearance with Small Blood, Large Leukocytes, No Bacteria, 21-50 RBC/HPF, and >50 WBC -Repeat after Foley was placed showed cloudy appearance, moderate hemoglobin, large leukocytes, many bacteria, greater than 50 RBCs per high-power field, and 21-50 WBCs -Urine culture showed Multiple Species -blood cultures x2 and these are NGTD at 2 Day  -Likely could be gram-negative rods causing his symptoms but Infectious Diseases does not feel that he clinically does not have a UTI so will hold off on GNR Treatment    Chronic kidney disease stage IV -AKI because of volume depletion plus minus obstructive uropathy.   -Creatinine at baseline between 2.6.  It is elevated to 3.3 in this admission. -Improved with IV fluid.  Nephrology consult appreciated.  Hyperkalemia -K+ was 5.3  improved with Lokelma. -Continue renal diet.  -Prior  to admission, patient was on Lasix 80 mg daily at home.  He has been now started on 40 mg daily. -Continue monitor and trend renal function -Repeat CMP in a.m.  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  Essential Hypertension -Blood pressure currently stable. -Continue home regimen with Amlodipine 10 mg po Daily, Clonidine 0.3 mg TID, Finasteride 5 mg po Daily, Hydralazine 50 mg po q8h, and Metoprolol Tartrate 50 mg po BID  -C/w Labetalol 10 mg q2hprn For SBP>160 -Patient's BP was 168/81  Left heel and foot unstageable ulcer. -POA. Dressing changes per wound care -s/p right BKA -LeftABI obtained on 2/4 indicated noncompressible left lower extremity arteries. -Currently on IV antibiotics with Daptomycin. -Can follow up with Vascular in the outpatient setting   History of Gout -Continue Allopurinol 100 mg po BID   Hypothyroidism -C/w Levothyroxine 50 mcg po Daily   DVT prophylaxis:  Heparin subcu Antimicrobials:  IV daptomycin Fluid: Not on IV fluid Diet: Renal diet  Code Status:  Full code Mobility: PT/OT eval obtained Family Communication:  None at bedside Discharge plan:  Anticipated date: Unclear at this time Disposition: SNF Barriers: Pending insurance authorization.  Consultants:   Gastroenterology  Infectious Diseases   Nephrology    Procedures: None  Antimicrobials: Anti-infectives (From admission, onward)   Start     Dose/Rate Route Frequency Ordered Stop   12/06/19 2000  DAPTOmycin (CUBICIN) 1,000 mg in sodium chloride 0.9 % IVPB     1,000 mg 240 mL/hr over 30 Minutes Intravenous Daily 12/06/19 0807 01/20/20 2359   12/05/19 1000  DAPTOmycin (CUBICIN) 1,000 mg in sodium chloride 0.9 % IVPB  Status:  Discontinued     1,000 mg 240 mL/hr over 30 Minutes Intravenous Every  other day 12/04/19 1456 12/06/19 0807   11/26/19 1730  DAPTOmycin (CUBICIN) 1,000 mg in sodium chloride 0.9 % IVPB  Status:  Discontinued     1,000 mg 240 mL/hr over 30 Minutes Intravenous Every 24 hours 11/26/19 1642 12/04/19 1456        Code Status: Full Code   Diet Order            Diet regular Room service appropriate? Yes; Fluid consistency: Thin  Diet effective now              Infusions:  . sodium chloride Stopped (11/27/19 2024)  . sodium chloride 75 mL/hr at 12/08/19 1100  . DAPTOmycin (CUBICIN)  IV Stopped (12/07/19 2116)    Scheduled Meds: . allopurinol  100 mg Oral BID  . amLODipine  10 mg Oral Daily  . Chlorhexidine Gluconate Cloth  6 each Topical Daily  . cloNIDine  0.3 mg Oral TID  . feeding supplement (ENSURE ENLIVE)  237 mL Oral BID BM  . finasteride  5 mg Oral Daily  . furosemide  40 mg Oral Daily  . heparin injection (subcutaneous)  5,000 Units Subcutaneous Q8H  . hydrALAZINE  50 mg Oral Q8H  . insulin aspart  0-15 Units Subcutaneous TID WC  . insulin aspart  0-5 Units Subcutaneous QHS  . levothyroxine  50 mcg Oral Q0600  . lidocaine  2 patch Transdermal Q24H  . lidocaine-prilocaine   Topical Daily  . metoprolol tartrate  50 mg Oral BID  . nutrition supplement (JUVEN)  1 packet Oral  BID BM  . oxyCODONE  30 mg Oral TID  . pantoprazole  40 mg Oral Daily  . sodium chloride flush  3 mL Intravenous Q12H  . sodium zirconium cyclosilicate  10 g Oral Daily  . tamsulosin  0.4 mg Oral Daily    PRN meds: sodium chloride, acetaminophen **OR** acetaminophen, bisacodyl, HYDROmorphone (DILAUDID) injection, labetalol, ondansetron **OR** ondansetron (ZOFRAN) IV, oxyCODONE, tiZANidine   Objective: Vitals:   12/07/19 2037 12/08/19 0443  BP: (!) 142/76 127/77  Pulse: (!) 56 (!) 53  Resp: 18 18  Temp: 98.4 F (36.9 C) 98.4 F (36.9 C)  SpO2: 97% 96%    Intake/Output Summary (Last 24 hours) at 12/08/2019 1333 Last data filed at 12/08/2019 1100 Gross per 24  hour  Intake 2501.1 ml  Output 1635 ml  Net 866.1 ml   Filed Weights   12/03/19 2104 12/04/19 2107 12/07/19 2037  Weight: 111.2 kg 111.2 kg 112 kg   Weight change:  Body mass index is 28.53 kg/m.   Physical Exam: General exam: Appears calm and comfortable.  Lying on bed.  Not in distress. Skin: No rashes, lesions or ulcers. HEENT: Atraumatic, normocephalic, supple neck, no obvious bleeding Lungs: Clear to auscultation bilaterally, no wheezing or crackles. CVS: Regular rate and rhythm, no murmur GI/Abd soft, nontender, nondistended, bowel sound present CNS: Alert, awake oriented x3 Psychiatry: Mood appropriate Extremities: Right BKA status, left leg with bandage wound. Gouty changes left foot  Data Review: I have personally reviewed the laboratory data and studies available.  Recent Labs  Lab 12/02/19 0353 12/02/19 0353 12/03/19 0733 12/04/19 0845 12/05/19 0515 12/06/19 0520 12/07/19 0539  WBC 7.6   < > 8.0 6.6 8.9 5.8 6.0  NEUTROABS 4.8  --  5.6 4.5 6.4  --  3.7  HGB 8.9*   < > 8.6* 9.4* 8.9* 8.5* 8.9*  HCT 27.6*   < > 27.7* 29.8* 29.0* 27.0* 27.8*  MCV 98.2   < > 100.4* 98.3 100.7* 98.9 98.9  PLT 223   < > 220 235 248 235 259   < > = values in this interval not displayed.   Recent Labs  Lab 12/02/19 0353 12/02/19 0353 12/03/19 0733 12/03/19 0733 12/04/19 0845 12/05/19 0515 12/06/19 0520 12/07/19 0539 12/08/19 0356  NA 137   < > 137   < > 137 139 137 135 134*  K 4.3   < > 4.8   < > 5.1 5.8* 5.3* 5.3* 5.2*  CL 103   < > 105   < > 105 106 102 106 101  CO2 23   < > 23   < > 22 23 24  21* 22  GLUCOSE 178*   < > 167*   < > 213* 167* 172* 146* 143*  BUN 33*   < > 37*   < > 38* 38* 37* 40* 41*  CREATININE 2.96*   < > 3.20*   < > 3.34* 3.18* 3.01* 2.91* 2.86*  CALCIUM 8.5*   < > 8.7*   < > 8.6* 9.0 8.9 9.0 9.2  MG 1.8  --  1.7  --  1.8 1.9  --  1.6*  --   PHOS 5.7*   < > 5.6*   < > 4.8* 5.5* 5.7* 5.4* 5.7*   < > = values in this interval not displayed.     Terrilee Croak, MD  [facility] 12/08/2019

## 2019-12-08 NOTE — Plan of Care (Signed)
  Problem: Nutrition: Goal: Adequate nutrition will be maintained Outcome: Progressing   

## 2019-12-08 NOTE — TOC Initial Note (Signed)
Transition of Care [facility]) - Initial/Assessment Note    Patient Details  Name: Thomas [name] MRN: [id number] Date of Birth: [birth year]  Transition of Care [facility]) CM/SW Contact:    Thomas Feil, LCSW Phone Number: 12/08/2019, 5:11 PM  Clinical Narrative:  CSS talked with patient by phone regarding his discharge disposition and recommendation of ST rehab. Thomas [name] was agreeable to ST rehab and reported that he lives alone and him being in the hospital was a set-back as he fell at home. Thomas [name] provided CSW  the facilities he wants contacted: SNF's in [place], [place], [place], [place] (his 1st choice). CSW informed that his older brother Thomas is main contact. When asked, Thomas [name] reported that he has never been married and has no children.              Expected Discharge Plan: Skilled Nursing Facility Barriers to Discharge: SNF Pending bed offer   Patient Goals and CMS Choice Patient states their goals for this hospitalization and ongoing recovery are:: Patient aware that ST rehab needed before returning home. CMS Medicare.gov Compare Post Acute Care list provided to:: Other (Comment Required)(Patient provided CSW with his facility preferences) Choice offered to / list presented to : (Patient provided CSW with his facility preferences)  Expected Discharge Plan and Services Expected Discharge Plan: [place]       Living arrangements for the past 2 months: Apartment                                      Prior Living Arrangements/Services Living arrangements for the past 2 months: Apartment Lives with:: Self Patient language and need for interpreter reviewed:: No Do you feel safe going back to the place where you live?: No(Patient in agreement with ST rehab)      Need for Family Participation in Patient Care: Yes (Comment) Care giver support system in place?: No (comment)   Criminal Activity/Legal Involvement Pertinent  to Current Situation/Hospitalization: No - Comment as needed  Activities of Daily Living Home Assistive Devices/Equipment: Eyeglasses ADL Screening (condition at time of admission) Patient's cognitive ability adequate to safely complete daily activities?: Yes Is the patient deaf or have difficulty hearing?: No Does the patient have difficulty seeing, even when wearing glasses/contacts?: No Does the patient have difficulty concentrating, remembering, or making decisions?: No Patient able to express need for assistance with ADLs?: Yes Does the patient have difficulty dressing or bathing?: No Independently performs ADLs?: Yes (appropriate for developmental age) Does the patient have difficulty walking or climbing stairs?: Yes Weakness of Legs: Left(rt bka) Weakness of Arms/Hands: None  Permission Sought/Granted Permission sought to share information with : Family Supports Permission granted to share information with : Yes, Verbal Permission Granted  Share Information with NAME: Thomas [name]     Permission granted to share info w Relationship: Brother  Permission granted to share info w Contact Information: [phone]  Emotional Assessment Appearance:: Other (Comment Required(Talked with patient by phone)     Orientation: : Oriented to Self, Oriented to Place, Oriented to  Time, Oriented to Situation Alcohol / Substance Use: Tobacco Use, Alcohol Use, Illicit Drugs(Per H&P, patient has never smoked and does not drink or use illicit drugs) Psych Involvement: No (comment)  Admission diagnosis:  Upper GI bleeding [K92.2] Patient Active Problem List   Diagnosis Date Noted  . Urinary retention 12/04/2019  . Epidural abscess   .  Upper GI bleeding 11/24/2019  . BPH (benign prostatic hyperplasia) 11/24/2019  . CKD (chronic kidney disease), stage IV ([place]) 11/24/2019  . Chronic pain syndrome 11/24/2019  . Physical deconditioning 11/24/2019  . Pressure injury of skin 09/08/2019  . MRSA  bacteremia 08/31/2019  . 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 Wound infection after surgery 09/29/2018  . Pulmonary emboli ([place]) 09/29/2018  . [name], right ([place]) 09/29/2018  . [name], type 2) ([place]) 09/29/2018  . Gout 09/29/2018  . HTN (hypertension) 09/29/2018  . CHF (congestive heart failure), NYHA class I, chronic, diastolic ([place]) [id number]  . GERD (gastroesophageal reflux disease) 09/29/2018   PCP:  Thomas Jewel, MD Pharmacy:   [place], [place] - [address] [place] [address] Phone: [phone] Fax: [phone]     Social Determinants of Health (SDOH) Interventions  No SDOH interventions needed or requested at this time.  Readmission Risk Interventions Readmission Risk Prevention Plan 09/17/2019  Transportation Screening Complete  PCP or Specialist Appt within 5-7 Days Not Complete  Not Complete comments snf vs home  Home Care Screening Complete  Medication Review (RN CM) Complete  Some recent data might be hidden

## 2019-12-08 NOTE — Progress Notes (Signed)
Patient ID: Thomas [name], male   DOB: 1950/12/21, 69 y.o.   MRN: [id number] S: still complaining of back pain O:BP 127/77 (BP Location: Left Arm)   Pulse (!) 53   Temp 98.4 F (36.9 C) (Oral)   Resp 18   Ht 6\' 6"  (1.981 m)   Wt 112 kg   SpO2 96%   BMI 28.53 kg/m   Intake/Output Summary (Last 24 hours) at 12/08/2019 1255 Last data filed at 12/08/2019 1100 Gross per 24 hour  Intake 2741.1 ml  Output 2010 ml  Net 731.1 ml   Intake/Output: I/O last 3 completed shifts: In: 5760.3 [P.O.:2040; I.V.:3477.3; Other:3; IV Piggyback:240] Out: 5461 [Urine:5460; Stool:1]  Intake/Output this shift:  Total I/O In: 755.2 [P.O.:240; I.V.:395.2; Other:120] Out: -  Weight change:  Gen: NAD CVS: no rub Resp: cta Abd: +BS, soft, NT/Nd Ext: no edema s/p RBKA  Recent Labs  Lab 12/02/19 0353 12/03/19 0733 12/04/19 0845 12/05/19 0515 12/06/19 0520 12/07/19 0539 12/08/19 0356  NA 137 137 137 139 137 135 134*  K 4.3 4.8 5.1 5.8* 5.3* 5.3* 5.2*  CL 103 105 105 106 102 106 101  CO2 23 23 22 23 24  21* 22  GLUCOSE 178* 167* 213* 167* 172* 146* 143*  BUN 33* 37* 38* 38* 37* 40* 41*  CREATININE 2.96* 3.20* 3.34* 3.18* 3.01* 2.91* 2.86*  ALBUMIN 2.7* 2.6* 2.6* 2.7* 2.5* 2.7* 2.8*  CALCIUM 8.5* 8.7* 8.6* 9.0 8.9 9.0 9.2  PHOS 5.7* 5.6* 4.8* 5.5* 5.7* 5.4* 5.7*  AST 21 18 19 17   --  18  --   ALT 14 13 12 12   --  13  --    Liver Function Tests: Recent Labs  Lab 12/04/19 0845 12/04/19 0845 12/05/19 0515 12/05/19 0515 12/06/19 0520 12/07/19 0539 12/08/19 0356  AST 19  --  17  --   --  18  --   ALT 12  --  12  --   --  13  --   ALKPHOS 77  --  76  --   --  80  --   BILITOT 0.5  --  0.5  --   --  0.5  --   PROT 6.8  --  7.0  --   --  6.8  --   ALBUMIN 2.6*   < > 2.7*   < > 2.5* 2.7* 2.8*   < > = values in this interval not displayed.   No results for input(s): LIPASE, AMYLASE in the last 168 hours. No results for input(s): AMMONIA in the last 168 hours. CBC: Recent Labs  Lab  12/03/19 0733 12/03/19 0733 12/04/19 0845 12/04/19 0845 12/05/19 0515 12/06/19 0520 12/07/19 0539  WBC 8.0   < > 6.6   < > 8.9 5.8 6.0  NEUTROABS 5.6   < > 4.5  --  6.4  --  3.7  HGB 8.6*   < > 9.4*   < > 8.9* 8.5* 8.9*  HCT 27.7*   < > 29.8*   < > 29.0* 27.0* 27.8*  MCV 100.4*  --  98.3  --  100.7* 98.9 98.9  PLT 220   < > 235   < > 248 235 259   < > = values in this interval not displayed.   Cardiac Enzymes: Recent Labs  Lab 12/05/19 0515  CKTOTAL 29*   CBG: Recent Labs  Lab 12/07/19 1133 12/07/19 1630 12/07/19 2037 12/08/19 0723 12/08/19 1108  GLUCAP 137* 130* 143* 145* 137*  Iron Studies:  Recent Labs    12/07/19 0539  IRON 79  TIBC 230*  FERRITIN 141   Studies/Results: No results found. Marland Kitchen allopurinol  100 mg Oral BID  . amLODipine  10 mg Oral Daily  . Chlorhexidine Gluconate Cloth  6 each Topical Daily  . cloNIDine  0.3 mg Oral TID  . feeding supplement (ENSURE ENLIVE)  237 mL Oral BID BM  . finasteride  5 mg Oral Daily  . furosemide  40 mg Oral Daily  . heparin injection (subcutaneous)  5,000 Units Subcutaneous Q8H  . hydrALAZINE  50 mg Oral Q8H  . insulin aspart  0-15 Units Subcutaneous TID WC  . insulin aspart  0-5 Units Subcutaneous QHS  . levothyroxine  50 mcg Oral Q0600  . lidocaine  2 patch Transdermal Q24H  . lidocaine-prilocaine   Topical Daily  . metoprolol tartrate  50 mg Oral BID  . nutrition supplement (JUVEN)  1 packet Oral BID BM  . oxyCODONE  30 mg Oral TID  . pantoprazole  40 mg Oral Daily  . sodium chloride flush  3 mL Intravenous Q12H  . sodium zirconium cyclosilicate  10 g Oral Daily  . tamsulosin  0.4 mg Oral Daily    BMET    Component Value Date/Time   NA 134 (L) 12/08/2019 0356   K 5.2 (H) 12/08/2019 0356   CL 101 12/08/2019 0356   CO2 22 12/08/2019 0356   GLUCOSE 143 (H) 12/08/2019 0356   BUN 41 (H) 12/08/2019 0356   CREATININE 2.86 (H) 12/08/2019 0356   CALCIUM 9.2 12/08/2019 0356   GFRNONAA 22 (L) 12/08/2019  0356   GFRAA 25 (L) 12/08/2019 0356   CBC    Component Value Date/Time   WBC 6.0 12/07/2019 0539   RBC 2.81 (L) 12/07/2019 0539   HGB 8.9 (L) 12/07/2019 0539   HCT 27.8 (L) 12/07/2019 0539   PLT 259 12/07/2019 0539   MCV 98.9 12/07/2019 0539   MCH 31.7 12/07/2019 0539   MCHC 32.0 12/07/2019 0539   RDW 13.9 12/07/2019 0539   LYMPHSABS 1.5 12/07/2019 0539   MONOABS 0.4 12/07/2019 0539   EOSABS 0.4 12/07/2019 0539   BASOSABS 0.0 12/07/2019 0539    Assessment/Plan:  1. AKI/CKD stage IV- presumably due to volume depletion +/- obstructive uropathy. BUN/CR improving with IVF's and foley catheter.  1. Scr back at his baseline. 2. OK to stop IVF"s 3. Nothing further to add, will sign off.  Please call with questions or concerns. 4. Will need to follow up with his primary nephrologist in [place] after discharge. 2. Hyperkalemia-improving withwith lokelma. Continue to follow and renal diet.  1. restarted lasix but at lower dose than his home meds (was on 80 mg daily will start 40 mg and follow). 3. Epidural abscess- s/p drain per IR on 11/26/19. daptomycin per ID 4. MRSA discitis/bacteremia- as above completed tx in December 2020 5. HTN- stable 6. BPH- on flomax and foley in place. 7. Anemia acute on chronic due to CKD and acute infection. Will check iron stores and follow. Donetta Potts, MD [employer] [phone]

## 2019-12-08 NOTE — Progress Notes (Signed)
PT Cancellation Note  Patient Details Name: Thomas [name] MRN: [id number] DOB: 1951/05/28   Cancelled Treatment:    Reason Eval/Treat Not Completed: Other (comment).  Pt reports he is too nauseated and uncomfortable to do even in bed therapy.  Will try again tomorrow.  Has been given anti-nausea meds by nsg.   Ramond Dial 12/08/2019, 2:48 PM   Mee Hives, PT MS Acute Rehab Dept. Number: [place] and [place]

## 2019-12-09 LAB — GLUCOSE, CAPILLARY
Glucose-Capillary: 146 mg/dL — ABNORMAL HIGH (ref 70–99)
Glucose-Capillary: 142 mg/dL — ABNORMAL HIGH (ref 70–99)
Glucose-Capillary: 139 mg/dL — ABNORMAL HIGH (ref 70–99)
Glucose-Capillary: 182 mg/dL — ABNORMAL HIGH (ref 70–99)

## 2019-12-09 LAB — CULTURE, BLOOD (ROUTINE X 2)
Culture: NO GROWTH
Culture: NO GROWTH
Special Requests: ADEQUATE
Special Requests: ADEQUATE

## 2019-12-09 NOTE — NC FL2 (Signed)
[place] LEVEL OF CARE SCREENING TOOL     IDENTIFICATION  Patient Name: Thomas [name] Birthdate: [birth year] Sex: male Admission Date (Current Location): 11/24/2019  [place] and [place] Number:  O[place] Linsey [id number] [place] and Address:  The [place], [place] [address] [place], [place] [address]      Provider Number: [id number]  Attending Physician Name and Address:  Terrilee Croak, MD  Relative Name and Phone Number:  IRIC BEYMER - brother ; [phone]    Current Level of Care: Hospital Recommended Level of Care: [place] Prior Approval Number:    Date Approved/Denied:   PASRR Number: [id number]  Discharge Plan: SNF    Current Diagnoses: Patient Active Problem List   Diagnosis Date Noted  . Urinary retention 12/04/2019  . Epidural abscess   . Upper GI bleeding 11/24/2019  . BPH (benign prostatic hyperplasia) 11/24/2019  . CKD (chronic kidney disease), stage IV ([place]) 11/24/2019  . Chronic pain syndrome 11/24/2019  . Physical deconditioning 11/24/2019  . Pressure injury of skin 09/08/2019  . MRSA bacteremia 08/31/2019  . 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 Wound infection after surgery 09/29/2018  . Pulmonary emboli ([place]) 09/29/2018  . [name], right ([place]) 09/29/2018  . [name], type 2) ([place]) 09/29/2018  . Gout 09/29/2018  . HTN (hypertension) 09/29/2018  . CHF (congestive heart failure), NYHA class I, chronic, diastolic ([place]) [id number]  . GERD (gastroesophageal reflux disease) 09/29/2018    Orientation RESPIRATION BLADDER Height & Weight     Self, Time, Situation, Place  Normal Continent Weight: (Pt bed scale not accurate, pt weak, RN notified) Height:  6\' 6"  (198.1 cm)  BEHAVIORAL SYMPTOMS/MOOD NEUROLOGICAL BOWEL NUTRITION STATUS      Continent Diet(Regular diet)  AMBULATORY STATUS COMMUNICATION OF NEEDS Skin   Total Care(Patient has not ambulated with PT during hosp. stay) Verbally  Skin abrasions, Other (Comment)(Abrasion bilateral legs, excoriated lower hip and leg, unstageable pressure injury to left heel with dressing)                       Personal Care Assistance Level of Assistance  Bathing, Feeding, Dressing Bathing Assistance: Maximum assistance(upper body-assistance with set-up; lower body mod assist) Feeding assistance: Limited assistance(Assistance with set-up) Dressing Assistance: Maximum assistance(upper body assistance with set-up; lower body mod assist)     Functional Limitations Info  Sight, Hearing, Speech Sight Info: Adequate Hearing Info: Adequate Speech Info: Adequate    SPECIAL CARE FACTORS FREQUENCY  PT (By licensed PT), OT (By licensed OT)     PT Frequency: Evaluated at hospital 2/8. PT at SNF Eval and Treat, a minimum of 5 times per week OT Frequency: Evaluated at hospital 2/8. OT at SNF Eval and Treat, a minimum of 5 times per week            Contractures Contractures Info: Not present    Additional Factors Info  Code Status, Allergies Code Status Info: Full Allergies Info: Other, Oxycodone, Oxymorphone, Aspirin, Beta Vulgaris, Buspirone, Cabbage, Codeine, Fish Allergy, Fish-derived Products, Pentazocine, Propoxyphene, Shellfish Allergy, Sulfa Antibiotics, Sulfasalazine, Amoxicillin, Apap, Acetaminophen           Current Medications (12/09/2019):  This is the current hospital active medication list Current Facility-Administered Medications  Medication Dose Route Frequency Provider Last Rate Last Admin  . 0.9 %  sodium chloride infusion   Intravenous PRN Lavina Hamman, MD   Stopped at 11/27/19 2024  . 0.9 %  sodium chloride infusion   Intravenous Continuous Roney Jaffe, MD 75 mL/hr at 12/08/19 2307 New Bag at 12/08/19 2307  . acetaminophen (TYLENOL) tablet 650 mg  650 mg Oral Q6H PRN Carol Ada, MD   650 mg at 11/25/19 2302   Or  . acetaminophen (TYLENOL) suppository 650 mg  650 mg Rectal Q6H PRN Carol Ada,  MD      . allopurinol (ZYLOPRIM) tablet 100 mg  100 mg Oral BID Carol Ada, MD   100 mg at 12/08/19 2137  . amLODipine (NORVASC) tablet 10 mg  10 mg Oral Daily Carol Ada, MD   10 mg at 12/08/19 0853  . bisacodyl (DULCOLAX) suppository 10 mg  10 mg Rectal Daily PRN Gardiner Barefoot, NP   10 mg at 12/06/19 2307  . Chlorhexidine Gluconate Cloth 2 % PADS 6 each  6 each Topical Daily Raiford Noble [place], [place]   6 each at 12/08/19 1131  . cloNIDine (CATAPRES) tablet 0.3 mg  0.3 mg Oral TID Carol Ada, MD   0.3 mg at 12/09/19 0521  . DAPTOmycin (CUBICIN) 1,000 mg in sodium chloride 0.9 % IVPB  1,000 mg Intravenous Q2000 Susa Raring, [facility]   Stopped at 12/08/19 2141  . feeding supplement (ENSURE ENLIVE) (ENSURE ENLIVE) liquid 237 mL  237 mL Oral BID BM Sheikh, Omair Latif, DO   237 mL at 12/08/19 1411  . finasteride (PROSCAR) tablet 5 mg  5 mg Oral Daily Carol Ada, MD   5 mg at 12/08/19 0853  . furosemide (LASIX) tablet 40 mg  40 mg Oral Daily Donato Heinz, MD   40 mg at 12/08/19 0852  . heparin injection 5,000 Units  5,000 Units Subcutaneous Q8H Lavina Hamman, MD   5,000 Units at 12/09/19 0521  . hydrALAZINE (APRESOLINE) tablet 50 mg  50 mg Oral Q8H Carol Ada, MD   50 mg at 12/09/19 0521  . HYDROmorphone (DILAUDID) injection 1 mg  1 mg Intravenous Q6H PRN Guilford Shi, MD   1 mg at 12/09/19 0031  . insulin aspart (novoLOG) injection 0-15 Units  0-15 Units Subcutaneous TID WC Carol Ada, MD   2 Units at 12/09/19 0809  . insulin aspart (novoLOG) injection 0-5 Units  0-5 Units Subcutaneous QHS Carol Ada, MD   2 Units at 12/05/19 2219  . labetalol (NORMODYNE) injection 10 mg  10 mg Intravenous Q2H PRN Carol Ada, MD   10 mg at 11/24/19 1458  . levothyroxine (SYNTHROID) tablet 50 mcg  50 mcg Oral Q0600 Carol Ada, MD   50 mcg at 12/09/19 0521  . lidocaine (LIDODERM) 5 % 2 patch  2 patch Transdermal Q24H Guilford Shi, MD   2 patch at 12/05/19 1706  .  lidocaine-prilocaine (EMLA) cream   Topical Daily Raiford Noble [place], [place]   Given at 12/08/19 [id number]  . metoprolol tartrate (LOPRESSOR) tablet 50 mg  50 mg Oral BID Gardiner Barefoot, NP   50 mg at 12/08/19 2137  . nutrition supplement (JUVEN) (JUVEN) powder packet 1 packet  1 packet Oral BID BM Carol Ada, MD   1 packet at 12/08/19 1407  . ondansetron (ZOFRAN) tablet 4 mg  4 mg Oral Q6H PRN Carol Ada, MD   4 mg at 12/08/19 1336   Or  . ondansetron (ZOFRAN) injection 4 mg  4 mg Intravenous Q6H PRN Carol Ada, MD   4 mg at 12/03/19 0603  . oxyCODONE (Oxy IR/ROXICODONE) immediate release tablet 10 mg  10 mg Oral Q4H PRN Benson Norway,  Saralyn Pilar, MD   10 mg at 12/09/19 0437  . oxyCODONE (OXYCONTIN) 12 hr tablet 30 mg  30 mg Oral TID Lavina Hamman, MD   30 mg at 12/08/19 2137  . pantoprazole (PROTONIX) EC tablet 40 mg  40 mg Oral Daily Lavina Hamman, MD   40 mg at 12/08/19 0853  . sodium chloride flush (NS) 0.9 % injection 3 mL  3 mL Intravenous Q12H Carol Ada, MD   3 mL at 12/08/19 1131  . sodium zirconium cyclosilicate (LOKELMA) packet 10 g  10 g Oral Daily Donato Heinz, MD   10 g at 12/08/19 1100  . tamsulosin (FLOMAX) capsule 0.4 mg  0.4 mg Oral Daily Carol Ada, MD   0.4 mg at 12/08/19 0853  . tiZANidine (ZANAFLEX) tablet 2 mg  2 mg Oral Q8H PRN Carol Ada, MD   2 mg at 12/09/19 0247     Discharge Medications: Please see discharge summary for a list of discharge medications.  Relevant Imaging Results:  Relevant Lab Results:   Additional Information ss#[id number]. Patient has right BKA and has a prosthesis.  Sable Feil, LCSW

## 2019-12-09 NOTE — Progress Notes (Addendum)
PROGRESS NOTE  Thomas [name]  DOB: [birth year]  PCP: Antonietta Jewel, MD [id number]  DOA: 11/24/2019 Admitted From: Home  LOS: 15 days   No chief complaint on file.  Brief narrative: The patient is a 69 y.o.malewith h/oCVA, HTN, hyperlipidemia,Gout,DM, CBP,PAD,s/pright BKA, chronic anemia, who was admitted on 1/31 to [facility] a fall with inability to get up on his own- prolonged down time. He was found to have a LLE wound/cellulitis with concern for outpatient treatment failure with Clindamycin. He was treated with IV Dapto with planned to transition to Linezolid at d/c (due to h/o MRSA bacteremia/diskitis in Nov 2020).  On the evening of 2/3, he developed abdominal pain with n/v with coffee ground emesis that was gastroccult positive and so he was transferred to [facility] for GI evaluation.   During this hospitalization, patient also c/o severe back pain. 2/5, lumbar spine MRI showed progressive osteomyelitis and persistent paraspinal cellulitis and abscess at T10-11 with an epidural abscess.  11/26/19- Patient underwent IR guided drainage.  Cultures negative.  Currently plan isIV Daptomycinand prolonged oral antibiotics afterwards.  2/12- CT of the abdomen pelvis without contrast showed persistent destructive changes from T10/T11 discitis and osteomyelitis but no other acute intra-abdominal and intrapelvic processes and did mention that the bladder was unremarkable and adrenals were normal.  Subjective: Patient was seen and examined this morning.  900.  Not in distress.  No new symptoms.  Worked with physical therapy this morning.  Asking for pain medicines.  Assessment/Plan: T10-T11 Discitis with Epidural Abscess, MRSA bacteremia -He was previously treated at [facility] from 11/11-12/20 for T10-11 discitis and MRSA bacteremia. Daptomycin was transitioned to [place] at the time of d/c to home.  -MRI this admission shows evidence of epidural abscess. Neuro surgery,  Infectious disease were consulted.  -s/pIR guided drainage of the epidural abscess -Infectious diseases recommends 8 weeks of IV daptomycin (till 01/20/20).  Pharmacy to renally dose.    Upper GI bleeding/hematemesis Macrocytic Anemia/Anemia of Chronic Kidney Disease  -Hemoglobin stable between 8-9. -S/P upper GI endoscopy by Dr. Benson Norway. -No evidence of acute abnormality. -Continue PPI. patient is back on regular diet. Avoid NSAIDs. -DVT prophylaxis with heparin resumed  Chronic Pain Syndrome -On multiple pain medications. -Home meds include OxyContin 30 mg 3 times daily, oxycodone 20 mg 3 times daily as needed, Zanaflex 2 mg 3 times daily as needed -Currently on scheduled OxyContin and Zanaflex at home dose.  Continue Roxicodone 10 mg every 4 hours as needed -Stop IV Dilaudid.  Needs to be adjusted further to avoid opiate tolerance.    BPH Acute Urinary Retention -Continue Tamsulosin 0.4 mg po Daily and Finasteride 5 mg po Daily  -Patient was retaining urine so Foley catheter was placed and drained 775 mL immediately -Bladder scan showed that patient had significant amount of urinary retention -Renal ultrasound did not show hydronephrosis.   -Continue Foley Catheter Drainage; Likely will need Urology follow up at D/C   Suspected UTI -Initial UA showed Turbid Appearance with Small Blood, Large Leukocytes, No Bacteria, 21-50 RBC/HPF, and >50 WBC -Repeat after Foley was placed showed cloudy appearance, moderate hemoglobin, large leukocytes, many bacteria, greater than 50 RBCs per high-power field, and 21-50 WBCs -Urine culture showed Multiple Species -blood cultures x2 and these are NGTD at 2 Day  -Likely could be gram-negative rods causing his symptoms but Infectious Diseases does not feel that he clinically does not have a UTI so will hold off on GNR Treatment    Chronic kidney disease stage  IV -AKI because of volume depletion plus minus obstructive uropathy.   -Creatinine at  baseline between 2.6.  It is elevated to 3.3 in this admission. -Improved with IV fluid.  Nephrology consult appreciated.  Hyperkalemia -K+ was 5.3  improved with Lokelma. -Continue renal diet.  -Prior to admission, patient was on Lasix 80 mg daily at home.  He has been now started on 40 mg daily. -Continue monitor and trend renal function -Repeat CMP in a.m.  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  Essential Hypertension -Blood pressure currently stable. -Continue home regimen with Amlodipine 10 mg po Daily, Clonidine 0.3 mg TID, Finasteride 5 mg po Daily, Hydralazine 50 mg po q8h, and Metoprolol Tartrate 50 mg po BID  -C/w Labetalol 10 mg q2hprn For SBP>160 -Patient's BP was 168/81  Left heel and foot unstageable ulcer. -POA. Dressing changes per wound care -s/p right BKA -LeftABI obtained on 2/4 indicated noncompressible left lower extremity arteries. -Currently on IV antibiotics with Daptomycin. -Can follow up with Vascular in the outpatient setting   History of Gout -Continue Allopurinol 100 mg po BID   Hypothyroidism -Continue levothyroxine 50 mcg po Daily   DVT prophylaxis:  Heparin subcu Antimicrobials:  IV daptomycin Fluid: Not on IV fluid Diet: Renal diet  Code Status:  Full code Mobility: PT/OT eval obtained Family Communication:  None at bedside Discharge plan:  Anticipated date: Unclear at this time Disposition: SNF Barriers: Pending insurance authorization.  Medically stable for discharge.  Consultants:   Gastroenterology  Infectious Diseases   Nephrology    Procedures: None  Antimicrobials: Anti-infectives (From admission, onward)   Start     Dose/Rate Route Frequency Ordered Stop   12/06/19 2000  DAPTOmycin (CUBICIN) 1,000 mg in sodium chloride 0.9 % IVPB      1,000 mg 240 mL/hr over 30 Minutes Intravenous Daily 12/06/19 0807 01/20/20 2359   12/05/19 1000  DAPTOmycin (CUBICIN) 1,000 mg in sodium chloride 0.9 % IVPB  Status:  Discontinued     1,000 mg 240 mL/hr over 30 Minutes Intravenous Every other day 12/04/19 1456 12/06/19 0807   11/26/19 1730  DAPTOmycin (CUBICIN) 1,000 mg in sodium chloride 0.9 % IVPB  Status:  Discontinued     1,000 mg 240 mL/hr over 30 Minutes Intravenous Every 24 hours 11/26/19 1642 12/04/19 1456        Code Status: Full Code   Diet Order            Diet regular Room service appropriate? Yes; Fluid consistency: Thin  Diet effective now              Infusions:  . sodium chloride Stopped (11/27/19 2024)  . sodium chloride 75 mL/hr at 12/08/19 2307  . DAPTOmycin (CUBICIN)  IV Stopped (12/08/19 2141)    Scheduled Meds: . allopurinol  100 mg Oral BID  . amLODipine  10 mg Oral Daily  . Chlorhexidine Gluconate Cloth  6 each Topical Daily  . cloNIDine  0.3 mg Oral TID  . feeding supplement (ENSURE ENLIVE)  237 mL Oral BID BM  . finasteride  5 mg Oral Daily  . furosemide  40 mg Oral Daily  . heparin injection (subcutaneous)  5,000 Units Subcutaneous Q8H  . hydrALAZINE  50 mg Oral Q8H  . insulin aspart  0-15 Units Subcutaneous TID WC  . insulin aspart  0-5 Units Subcutaneous QHS  .  levothyroxine  50 mcg Oral Q0600  . lidocaine  2 patch Transdermal Q24H  . lidocaine-prilocaine   Topical Daily  . metoprolol tartrate  50 mg Oral BID  . nutrition supplement (JUVEN)  1 packet Oral BID BM  . oxyCODONE  30 mg Oral TID  . pantoprazole  40 mg Oral Daily  . sodium chloride flush  3 mL Intravenous Q12H  . sodium zirconium cyclosilicate  10 g Oral Daily  . tamsulosin  0.4 mg Oral Daily    PRN meds: sodium chloride, acetaminophen **OR** acetaminophen, bisacodyl, labetalol, ondansetron **OR** ondansetron (ZOFRAN) IV, oxyCODONE, tiZANidine   Objective: Vitals:   12/09/19 0417 12/09/19 0926  BP: 132/64 (!) 167/87   Pulse: (!) 55 65  Resp: 20 18  Temp: 98.1 F (36.7 C) 98.3 F (36.8 C)  SpO2: 98% 98%    Intake/Output Summary (Last 24 hours) at 12/09/2019 1304 Last data filed at 12/09/2019 0944 Gross per 24 hour  Intake 2811.69 ml  Output 4400 ml  Net -1588.31 ml   Filed Weights   12/03/19 2104 12/04/19 2107 12/07/19 2037  Weight: 111.2 kg 111.2 kg 112 kg   Weight change:  Body mass index is 28.53 kg/m.   Physical Exam: General exam: Appears calm and comfortable.  Lying down in bed.  Not in distress.  No new symptoms. Skin: No rashes, lesions or ulcers. HEENT: Atraumatic, normocephalic, supple neck, no obvious bleeding Lungs: Clear to auscultation bilaterally, no wheezing or crackles. CVS: Regular rate and rhythm, no murmur GI/Abd soft, nontender, nondistended, bowel sound present CNS: Alert, awake oriented x3 Psychiatry: Mood appropriate Extremities: Right BKA status, left leg with bandage wound. Gouty changes left foot  Data Review: I have personally reviewed the laboratory data and studies available.  Recent Labs  Lab 12/03/19 0733 12/04/19 0845 12/05/19 0515 12/06/19 0520 12/07/19 0539  WBC 8.0 6.6 8.9 5.8 6.0  NEUTROABS 5.6 4.5 6.4  --  3.7  HGB 8.6* 9.4* 8.9* 8.5* 8.9*  HCT 27.7* 29.8* 29.0* 27.0* 27.8*  MCV 100.4* 98.3 100.7* 98.9 98.9  PLT 220 235 248 235 259   Recent Labs  Lab 12/03/19 0733 12/03/19 0733 12/04/19 0845 12/05/19 0515 12/06/19 0520 12/07/19 0539 12/08/19 0356  NA 137   < > 137 139 137 135 134*  K 4.8   < > 5.1 5.8* 5.3* 5.3* 5.2*  CL 105   < > 105 106 102 106 101  CO2 23   < > 22 23 24  21* 22  GLUCOSE 167*   < > 213* 167* 172* 146* 143*  BUN 37*   < > 38* 38* 37* 40* 41*  CREATININE 3.20*   < > 3.34* 3.18* 3.01* 2.91* 2.86*  CALCIUM 8.7*   < > 8.6* 9.0 8.9 9.0 9.2  MG 1.7  --  1.8 1.9  --  1.6*  --   PHOS 5.6*   < > 4.8* 5.5* 5.7* 5.4* 5.7*   < > = values in this interval not displayed.    Terrilee Croak, MD  [facility] 12/09/2019

## 2019-12-09 NOTE — Clinical Social Work Note (Signed)
Facility search initiated for patient. CSW will f/u with facilities to determine if they can accept patient.   Vallery Mcdade Givens, MSW, LCSW Licensed Clinical Social Worker [place] [phone]

## 2019-12-09 NOTE — Plan of Care (Signed)
  Problem: Clinical Measurements: Goal: Ability to maintain clinical measurements within normal limits will improve Outcome: Progressing   

## 2019-12-09 NOTE — Progress Notes (Signed)
Physical Therapy Treatment Patient Details Name: Thomas [name] MRN: [id number] DOB: 1951/10/07 Today's Date: 12/09/2019    History of Present Illness Patient is a 69 year old male history of diabetes mellitus type 2, hypertension, chronic kidney disease stage III, anemia, R BKA Sept 2019, who was admitted at [facility] after having fever chills and back pain. Patient transferred from [facility] for MRSA bacteremia. CT abdomen showing discitis involvling T10, T11 area.    PT Comments    Pt was seen for discussion of his progress, and agreed to get up and sit on side of bed with supervised mobility.  He is sitting in a flexed posture, and is able to perform ROM to knees and hips on side of bed.  Reporting relief of some mm tightness with this activity, and will definitely benefit from being OOB to chair.  Has agreed to either have nursing or rehab get him up with hoyer lift next time.   Follow Up Recommendations  SNF     Equipment Recommendations  None recommended by PT    Recommendations for Other Services       Precautions / Restrictions Precautions Precautions: Back;Fall Precaution Booklet Issued: No Restrictions Weight Bearing Restrictions: No    Mobility  Bed Mobility Overal bed mobility: Needs Assistance Bed Mobility: Supine to Sit;Sit to Supine;Rolling Rolling: Supervision Sidelying to sit: Supervision Supine to sit: Supervision Sit to supine: Supervision   General bed mobility comments: sat up fully on center of bed with bed support first, then pivoted to side of bed and sat unsupported  Transfers                 General transfer comment: declined but agreed to [employer] next time  Ambulation/Gait                 Stairs             Wheelchair Mobility    Modified Rankin (Stroke Patients Only)       Balance                                            Cognition Arousal/Alertness: Awake/alert Behavior  During Therapy: WFL for tasks assessed/performed Overall Cognitive Status: Within Functional Limits for tasks assessed                                        Exercises General Exercises - Lower Extremity Ankle Circles/Pumps: Left;AROM Long Arc Quad: AROM;Both;20 reps Hip ABduction/ADduction: AROM;Both;10 reps Straight Leg Raises: AROM;Both;10 reps    General Comments General comments (skin integrity, edema, etc.): pt was seen for mobility and agreed to side of bed after having been unable the last several sessions.  Stretching on hamstrings side of bed helpful for spinal tightness per pt      Pertinent Vitals/Pain Pain Assessment: Faces Faces Pain Scale: Hurts little more Pain Location: back with mobility Pain Descriptors / Indicators: Guarding Pain Intervention(s): Monitored during session;Patient requesting pain meds-RN notified;RN gave pain meds during session;Repositioned    Home Living                      Prior Function            PT Goals (current goals can now be found  in the care plan section) Acute Rehab PT Goals Patient Stated Goal: get home Progress towards PT goals: Progressing toward goals    Frequency    Min 2X/week      PT Plan Current plan remains appropriate    Co-evaluation              AM-PAC PT "6 Clicks" Mobility   Outcome Measure  Help needed turning from your back to your side while in a flat bed without using bedrails?: None Help needed moving from lying on your back to sitting on the side of a flat bed without using bedrails?: A Little Help needed moving to and from a bed to a chair (including a wheelchair)?: A Little Help needed standing up from a chair using your arms (e.g., wheelchair or bedside chair)?: Total Help needed to walk in hospital room?: Total Help needed climbing 3-5 steps with a railing? : Total 6 Click Score: 13    End of Session   Activity Tolerance: Patient limited by fatigue;Patient  limited by pain Patient left: in bed;with call bell/phone within reach;with bed alarm set Nurse Communication: Mobility status PT Visit Diagnosis: History of falling (Z91.81);Pain     Time: [id number] PT Time Calculation (min) (ACUTE ONLY): 29 min  Charges:  $Therapeutic Exercise: 8-22 mins $Therapeutic Activity: 8-22 mins                    Ramond Dial 12/09/2019, 12:08 PM  Mee Hives, PT MS Acute Rehab Dept. Number: [place]

## 2019-12-10 LAB — CBC WITH DIFFERENTIAL/PLATELET
Abs Immature Granulocytes: 0.02 10*3/uL (ref 0.00–0.07)
Basophils Absolute: 0 10*3/uL (ref 0.0–0.1)
Basophils Relative: 1 %
Eosinophils Absolute: 0.3 10*3/uL (ref 0.0–0.5)
Eosinophils Relative: 6 %
HCT: 29.2 % — ABNORMAL LOW (ref 39.0–52.0)
Hemoglobin: 9.6 g/dL — ABNORMAL LOW (ref 13.0–17.0)
Immature Granulocytes: 0 %
Lymphocytes Relative: 31 %
Lymphs Abs: 1.8 10*3/uL (ref 0.7–4.0)
MCH: 31.4 pg (ref 26.0–34.0)
MCHC: 32.9 g/dL (ref 30.0–36.0)
MCV: 95.4 fL (ref 80.0–100.0)
Monocytes Absolute: 0.4 10*3/uL (ref 0.1–1.0)
Monocytes Relative: 7 %
Neutro Abs: 3.2 10*3/uL (ref 1.7–7.7)
Neutrophils Relative %: 55 %
Platelets: 261 10*3/uL (ref 150–400)
RBC: 3.06 MIL/uL — ABNORMAL LOW (ref 4.22–5.81)
RDW: 13.9 % (ref 11.5–15.5)
WBC: 5.9 10*3/uL (ref 4.0–10.5)
nRBC: 0 % (ref 0.0–0.2)

## 2019-12-10 LAB — BASIC METABOLIC PANEL
Anion gap: 12 (ref 5–15)
BUN: 49 mg/dL — ABNORMAL HIGH (ref 8–23)
CO2: 25 mmol/L (ref 22–32)
Calcium: 9.2 mg/dL (ref 8.9–10.3)
Chloride: 100 mmol/L (ref 98–111)
Creatinine, Ser: 3.42 mg/dL — ABNORMAL HIGH (ref 0.61–1.24)
GFR calc Af Amer: 20 mL/min — ABNORMAL LOW (ref >60)
GFR calc non Af Amer: 18 mL/min — ABNORMAL LOW (ref >60)
Glucose, Bld: 157 mg/dL — ABNORMAL HIGH (ref 70–99)
Potassium: 5.1 mmol/L (ref 3.5–5.1)
Sodium: 137 mmol/L (ref 135–145)

## 2019-12-10 LAB — GLUCOSE, CAPILLARY
Glucose-Capillary: 158 mg/dL — ABNORMAL HIGH (ref 70–99)
Glucose-Capillary: 137 mg/dL — ABNORMAL HIGH (ref 70–99)
Glucose-Capillary: 179 mg/dL — ABNORMAL HIGH (ref 70–99)
Glucose-Capillary: 132 mg/dL — ABNORMAL HIGH (ref 70–99)

## 2019-12-10 MED ORDER — TRAMADOL HCL 50 MG PO TABS
50.0000 mg | ORAL_TABLET | Freq: Once | ORAL | Status: AC
  Administered 2019-12-10: 50 mg via ORAL
  Filled 2019-12-10: qty 1

## 2019-12-10 NOTE — TOC Progression Note (Signed)
Transition of Care [facility]) - Progression Note    Patient Details  Name: Thomas [name] MRN: [id number] Date of Birth: December 08, 1950  [facility] CM/SW [place], [place] Phone Number: 12/10/2019, 1:38 PM  Clinical Narrative:    CSW contacted [facility] to get fax number for admissions. CSW was provided [phone]. CSW faxed fl2 and clinicals to provided fax number, received success receipt.   Expected Discharge Plan: [place] Barriers to Discharge: SNF Pending bed offer  Expected Discharge Plan and Services Expected Discharge Plan: [place] arrangements for the past 2 months: Apartment                                       Social Determinants of Health (SDOH) Interventions    Readmission Risk Interventions Readmission Risk Prevention Plan 09/17/2019  Transportation Screening Complete  PCP or Specialist Appt within 5-7 Days Not Complete  Not Complete comments snf vs home  Home Care Screening Complete  Medication Review (RN CM) Complete  Some recent data might be hidden

## 2019-12-10 NOTE — Plan of Care (Signed)
  Problem: Education: Goal: Knowledge of General Education information will improve Description: Including pain rating scale, medication(s)/side effects and non-pharmacologic comfort measures Outcome: Progressing   Problem: Skin Integrity: Goal: Risk for impaired skin integrity will decrease Outcome: Progressing   

## 2019-12-10 NOTE — Plan of Care (Signed)
  Problem: Clinical Measurements: Goal: Ability to maintain clinical measurements within normal limits will improve Outcome: Progressing   

## 2019-12-10 NOTE — Progress Notes (Signed)
PROGRESS NOTE    Thomas [name]  [id number] DOB: 1951/10/07 DOA: 11/24/2019 PCP: Antonietta Jewel, MD    Brief Narrative: 69 y.o.malewith h/oCVA, HTN, hyperlipidemia,Gout,DM, CBP,PAD,s/pright BKA, chronic anemia, who was admitted on 1/31 to [facility] a fall with inability to get up on his own- prolonged down time. He was found to have a LLE wound/cellulitis with concern for outpatient treatment failure with Clindamycin. He was treated with IV Dapto with planned to transition to Linezolid at d/c (due to h/o MRSA bacteremia/diskitis in Nov 2020).  On the evening of 2/3, he developed abdominal pain with n/v with coffee ground emesis that was gastroccult positive and so he was transferred to [address] [facility] for GI evaluation.   During this hospitalization, patient also c/o severe back pain. 2/5, lumbar spine MRI showed progressive osteomyelitis and persistent paraspinal cellulitis and abscess at T10-11 with an epidural abscess.  11/26/19- Patient underwent IR guided drainage.  Cultures negative.  Currently plan isIV Daptomycinand prolonged oral antibiotics afterwards. 2/12- CT of the abdomen pelvis without contrast showed persistent destructive changes from T10/T11 discitis and osteomyelitis but no other acute intra-abdominal and intrapelvic processes and did mention that the bladder was unremarkable and adrenals were normal.  12/10/2019 patient sitting up in bed trying to eat breakfast complains of lot of pain in the left foot patient known to me from previous admission in November.  Assessment & Plan:   Principal Problem:   Thoracic discitis Active Problems:   [name], type 2) (HCC)   Gout   HTN (hypertension)   Pressure injury of skin   Upper GI bleeding   BPH (benign prostatic hyperplasia)   CKD (chronic kidney disease), stage IV (HCC)   Chronic pain syndrome   Physical deconditioning   Epidural abscess   Urinary retention   T10-T11 Discitis with  Epidural Abscess, MRSA bacteremia -He was previously treated at [facility] from 11/11-12/20 for T10-11 discitis and MRSA bacteremia. Daptomycin was transitioned to Dalbavancin at the time of d/c to home.  -MRI this admission shows evidence of epidural abscess. Neuro surgery, Infectious disease were consulted.  -s/pIR guided drainage of the epidural abscess -Infectious diseases recommends 8 weeks of IV daptomycin (till 01/20/20).  Pharmacy to renally dose.    Upper GI bleeding/hematemesis Macrocytic Anemia/Anemia of Chronic Kidney Disease  -Hemoglobin stable between 8-9. -S/P upper GI endoscopy by Dr. Benson Norway. -No evidence of acute abnormality. -Continue PPI. patient is back on regular diet. Avoid NSAIDs. -DVT prophylaxis with heparin resumed  Chronic Pain Syndrome -On multiple pain medications. -Home meds include OxyContin 30 mg 3 times daily, oxycodone 20 mg 3 times daily as needed, Zanaflex 2 mg 3 times daily as needed -Currently on scheduled OxyContin and Zanaflex at home dose.  Continue Roxicodone 10 mg every 4 hours as needed -Stop IV Dilaudid.  Needs to be adjusted further to avoid opiate tolerance.    BPH Acute Urinary Retention -Continue Tamsulosin 0.4 mg po Daily and Finasteride 5 mg po Daily  -Patient was retaining urine so Foley catheter was placed and drained 775 mL immediately -Bladder scan showed that patient had significant amount of urinary retention -Renal ultrasound did not show hydronephrosis.   -Continue Foley Catheter Drainage; Likely will need Urology follow up at D/C   Suspected UTI -Initial UA showed Turbid Appearance with Small Blood, Large Leukocytes, No Bacteria, 21-50 RBC/HPF, and >50 WBC -Repeat after Foley was placed showed cloudy appearance, moderate hemoglobin, large leukocytes, many bacteria, greater than 50 RBCs per high-power field,  and 21-50 WBCs -Urine culture showed Multiple Species -blood cultures x2 and these are NGTD at2Day  -Likely could be  gram-negative rods causing his symptoms but Infectious Diseases does not feel that he clinically does not have a UTI so will hold off on GNR Treatment   Chronic kidney disease stage IV -AKI because of volume depletion plus minus obstructive uropathy.   -Creatinine at baseline between 2.6.  It is elevated to 3.3 in this admission. -Improved with IV fluid.  Nephrology consult appreciated.  Hyperkalemia -K+ was 5.3 improved with Lokelma. -Continue renal diet.  -Prior to admission, patient was on Lasix 80 mg daily at home.  He has been now started on 40 mg daily. -Continue monitor and trend renal function -Repeat CMP in a.m.  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  Essential Hypertension -Blood pressure currently stable. -Continue home regimen with Amlodipine 10 mg po Daily, Clonidine 0.3 mg TID, Finasteride 5 mg po Daily, Hydralazine 50 mg po q8h, and Metoprolol Tartrate 50 mg po BID  -C/w Labetalol 10 mg q2hprn For SBP>160 -Patient's BP was 168/81  Left heel and foot unstageable ulcer. -POA. Dressing changes per wound care -s/p right BKA -LeftABI obtained on 2/4 indicated noncompressible left lower extremity arteries. -Currently on IV antibiotics with Daptomycin. -Can follow up with Vascular in the outpatient setting   History of Gout -Continue Allopurinol 100 mg po BID   Hypothyroidism -Continue levothyroxine 50 mcg po Daily  Pressure Injury 11/24/19 Heel Left Unstageable - Full thickness tissue loss in which the base of the injury is covered by slough (yellow, tan, gray, green or brown) and/or eschar (tan, brown or black) in the wound bed. (Active)  11/24/19 0655  Location: Heel  Location Orientation: Left  Staging: Unstageable - Full thickness tissue loss in which the base of the injury is  covered by slough (yellow, tan, gray, green or brown) and/or eschar (tan, brown or black) in the wound bed.  Wound Description (Comments):   Present on Admission: Yes     Pressure Injury 09/02/19 Foot Left Unstageable - Full thickness tissue loss in which the base of the ulcer is covered by slough (yellow, tan, gray, green or brown) and/or eschar (tan, brown or black) in the wound bed. (Active)  09/02/19 0815  Location: Foot  Location Orientation: Left  Staging: Unstageable - Full thickness tissue loss in which the base of the ulcer is covered by slough (yellow, tan, gray, green or brown) and/or eschar (tan, brown or black) in the wound bed.  Wound Description (Comments):   Present on Admission: Yes      Nutrition Problem: Increased nutrient needs Etiology: wound healing     Signs/Symptoms: estimated needs    Interventions: Boost Breeze, Juven  Estimated body mass index is 28.53 kg/m as calculated from the following:   Height as of this encounter: 6\' 6"  (1.981 m).   Weight as of this encounter: 112 kg.  DVT prophylaxis: Heparin  code Status: Full code  family Communication: None  disposition Plan: Patient came from home.   Consultants:   GI ID and nephrology  Procedures: None Antimicrobials: Daptomycin Subjective: Resting in bed complaining of lower extremity pain  Objective: Vitals:   12/09/19 1649 12/09/19 2123 12/10/19 0509 12/10/19 0946  BP: 123/69 (!) 144/77 (!) 145/80 128/65  Pulse: (!) 57 60 66 (!) 58  Resp: 15  18 18 18   Temp: 98.4 F (36.9 C) 98.7 F (37.1 C) 98.7 F (37.1 C) 98 F (36.7 C)  TempSrc: Oral Oral Oral Oral  SpO2: 95% 98% 97% 97%  Weight:      Height:        Intake/Output Summary (Last 24 hours) at 12/10/2019 1204 Last data filed at 12/10/2019 0900 Gross per 24 hour  Intake 2190.55 ml  Output 4125 ml  Net -1934.45 ml   Filed Weights   12/03/19 2104 12/04/19 2107 12/07/19 2037  Weight: 111.2 kg 111.2 kg 112 kg     Examination:  General exam: Appears calm and comfortable  Respiratory system: Clear to auscultation. Respiratory effort normal. Cardiovascular system: S1 & S2 heard, RRR. No JVD, murmurs, rubs, gallops or clicks. No pedal edema. Gastrointestinal system: Abdomen is nondistended, soft and nontender. No organomegaly or masses felt. Normal bowel sounds heard. Central nervous system: Alert and oriented. No focal neurological deficits. Extremities: Right BKA left foot covered with dressings Psychiatry: Judgement and insight appear normal. Mood & affect appropriate.     Data Reviewed: I have personally reviewed following labs and imaging studies  CBC: Recent Labs  Lab 12/04/19 0845 12/05/19 0515 12/06/19 0520 12/07/19 0539 12/10/19 0401  WBC 6.6 8.9 5.8 6.0 5.9  NEUTROABS 4.5 6.4  --  3.7 3.2  HGB 9.4* 8.9* 8.5* 8.9* 9.6*  HCT 29.8* 29.0* 27.0* 27.8* 29.2*  MCV 98.3 100.7* 98.9 98.9 95.4  PLT 235 248 235 259 0000000   Basic Metabolic Panel: Recent Labs  Lab 12/04/19 0845 12/04/19 0845 12/05/19 0515 12/06/19 0520 12/07/19 0539 12/08/19 0356 12/10/19 0401  NA 137   < > 139 137 135 134* 137  K 5.1   < > 5.8* 5.3* 5.3* 5.2* 5.1  CL 105   < > 106 102 106 101 100  CO2 22   < > 23 24 21* 22 25  GLUCOSE 213*   < > 167* 172* 146* 143* 157*  BUN 38*   < > 38* 37* 40* 41* 49*  CREATININE 3.34*   < > 3.18* 3.01* 2.91* 2.86* 3.42*  CALCIUM 8.6*   < > 9.0 8.9 9.0 9.2 9.2  MG 1.8  --  1.9  --  1.6*  --   --   PHOS 4.8*  --  5.5* 5.7* 5.4* 5.7*  --    < > = values in this interval not displayed.   GFR: Estimated Creatinine Clearance: 29.9 mL/min (A) (by C-G formula based on SCr of 3.42 mg/dL (H)). Liver Function Tests: Recent Labs  Lab 12/04/19 0845 12/05/19 0515 12/06/19 0520 12/07/19 0539 12/08/19 0356  AST 19 17  --  18  --   ALT 12 12  --  13  --   ALKPHOS 77 76  --  80  --   BILITOT 0.5 0.5  --  0.5  --   PROT 6.8 7.0  --  6.8  --   ALBUMIN 2.6* 2.7* 2.5* 2.7* 2.8*    No results for input(s): LIPASE, AMYLASE in the last 168 hours. No results for input(s): AMMONIA in the last 168 hours. Coagulation Profile: No results for input(s): INR, PROTIME in the last 168 hours. Cardiac Enzymes: Recent Labs  Lab 12/05/19 0515  CKTOTAL 29*   BNP (last 3 results) No results for input(s): PROBNP in the last 8760 hours. HbA1C: No results for input(s): HGBA1C in the last 72 hours. CBG: Recent Labs  Lab 12/09/19 1135 12/09/19 1643 12/09/19 2122  12/10/19 0740 12/10/19 1127  GLUCAP 142* 139* 182* 158* 137*   Lipid Profile: No results for input(s): CHOL, HDL, LDLCALC, TRIG, CHOLHDL, LDLDIRECT in the last 72 hours. Thyroid Function Tests: No results for input(s): TSH, T4TOTAL, FREET4, T3FREE, THYROIDAB in the last 72 hours. Anemia Panel: No results for input(s): VITAMINB12, FOLATE, FERRITIN, TIBC, IRON, RETICCTPCT in the last 72 hours. Sepsis Labs: No results for input(s): PROCALCITON, LATICACIDVEN in the last 168 hours.  Recent Results (from the past 240 hour(s))  Culture, Urine     Status: Abnormal   Collection Time: 12/03/19  4:49 PM   Specimen: Urine, Random  Result Value Ref Range Status   Specimen Description URINE, RANDOM  Final   Special Requests   Final    NONE Performed at [facility], [address], [place], [place] [address]    Culture MULTIPLE SPECIES PRESENT, SUGGEST RECOLLECTION (A)  Final   Report Status 12/04/2019 FINAL  Final  Culture, blood (routine x 2)     Status: None   Collection Time: 12/04/19 11:16 AM   Specimen: BLOOD  Result Value Ref Range Status   Specimen Description BLOOD RIGHT ANTECUBITAL  Final   Special Requests   Final    BOTTLES DRAWN AEROBIC AND ANAEROBIC Blood Culture adequate volume   Culture   Final    NO GROWTH 5 DAYS Performed at [facility], [place] [address], [place], [place] [address]    Report Status 12/09/2019 FINAL  Final  Culture, blood (routine x 2)     Status: None   Collection  Time: 12/04/19 11:39 AM   Specimen: BLOOD  Result Value Ref Range Status   Specimen Description BLOOD LEFT ANTECUBITAL  Final   Special Requests   Final    BOTTLES DRAWN AEROBIC AND ANAEROBIC Blood Culture adequate volume   Culture   Final    NO GROWTH 5 DAYS Performed at [facility], [place] [address] [place], [place] [address]    Report Status 12/09/2019 FINAL  Final         Radiology Studies: No results found.      Scheduled Meds: . allopurinol  100 mg Oral BID  . amLODipine  10 mg Oral Daily  . Chlorhexidine Gluconate Cloth  6 each Topical Daily  . cloNIDine  0.3 mg Oral TID  . feeding supplement (ENSURE ENLIVE)  237 mL Oral BID BM  . finasteride  5 mg Oral Daily  . furosemide  40 mg Oral Daily  . heparin injection (subcutaneous)  5,000 Units Subcutaneous Q8H  . hydrALAZINE  50 mg Oral Q8H  . insulin aspart  0-15 Units Subcutaneous TID WC  . insulin aspart  0-5 Units Subcutaneous QHS  . levothyroxine  50 mcg Oral Q0600  . lidocaine  2 patch Transdermal Q24H  . lidocaine-prilocaine   Topical Daily  . metoprolol tartrate  50 mg Oral BID  . nutrition supplement (JUVEN)  1 packet Oral BID BM  . oxyCODONE  30 mg Oral TID  . pantoprazole  40 mg Oral Daily  . sodium chloride flush  3 mL Intravenous Q12H  . sodium zirconium cyclosilicate  10 g Oral Daily  . tamsulosin  0.4 mg Oral Daily   Continuous Infusions: . sodium chloride Stopped (11/27/19 2024)  . sodium chloride 75 mL/hr at 12/10/19 0300  . DAPTOmycin (CUBICIN)  IV 1,000 mg (12/09/19 2032)     LOS: 16 days     Georgette Shell, MD 12/10/2019,  12:04 PM

## 2019-12-11 LAB — GLUCOSE, CAPILLARY
Glucose-Capillary: 137 mg/dL — ABNORMAL HIGH (ref 70–99)
Glucose-Capillary: 137 mg/dL — ABNORMAL HIGH (ref 70–99)
Glucose-Capillary: 128 mg/dL — ABNORMAL HIGH (ref 70–99)
Glucose-Capillary: 172 mg/dL — ABNORMAL HIGH (ref 70–99)

## 2019-12-11 NOTE — Progress Notes (Addendum)
PROGRESS NOTE    Thomas [name]  [id number] DOB: [birth year] DOA: 11/24/2019 PCP: Antonietta Jewel, MD   Brief Narrative: Patient is a 69 year old male with history of CVA, hypertension, hyperlipidemia, gout, diabetes mellitus, peripheral artery disease, status post right BKA, chronic anemia was admitted on 1/31 [facility] after a fall.  He was found to have left lower extremity wound/cellulitis with concern for outpatient treatment fluid clindamycin.  He was treated with daptomycin and was planned to transition to linezolid at DC.  On evening of 2/3 he deveopled lower abdomen pain with nausea and vomiting with coffee-ground emesis .  FOBT was positive and he was transferred to [facility] for GI evaluation.  On 2/5, lumbar spinal MRI showed progressive osteomyelitis and persistent paraspinal cellulitis and abscess at that time as T10-11 with epidural abscess.  Underwent IR guided drainage.  Cultures negative.  ID was following.  Current plan is to continue daptomycin with prolonged p.o. antibiotics afterwards.  Currently waiting for placement.  SW closely following.  Assessment & Plan:   Principal Problem:   Thoracic discitis Active Problems:   [name], type 2) (HCC)   Gout   HTN (hypertension)   Pressure injury of skin   Upper GI bleeding   BPH (benign prostatic hyperplasia)   CKD (chronic kidney disease), stage IV (HCC)   Chronic pain syndrome   Physical deconditioning   Epidural abscess   Urinary retention   T10-T11 discitis with epidural abscess/MRSA bacteremia: Was previously hospitalized here from 11/11-12/20 for T10-T 11 discitis and MRSA bacteremia.  Daptomycin was transitioned to dalbavancin at time of DC home.  MRI on this admission showed evidence epidural abscess.  Neurosurgery, infectious disease were following.  Status post IR guided drainage of epidural abscess.  Infectious disease recommends 8 weeks of IV daptomycin, till 01/20/2020.  Upper GI  bleeding/hematemesis/anemia chronic disease: Hemoglobin stable between 8-9.  Status post upper GI endoscopy by Dr. Benson Norway.  No evidence of acute bleeding source.  Continue PPI.  Regular diet.  Chronic pain syndrome: Continue supportive care, pain management.  BPH/acute urinary retention: Continue tamsulosin, finasteride.  He had urinary retention during this hospitalization.  On Foley catheter.  Follow-up with urology on discharge.  Suspected urinary tract infection: Urine cultures showed multiple species.  Blood cultures are negative so far.  CKD stage IV: Presented with AKI because of volume depletion.  Improved with IV fluids.  Creatinine creeping up.  Will hold Lasix.  Hyperkalemia: Improved with Lokelma.  Hypertension: Above stable.  Continue current medications  Left heel/foot unstageable ulcer: Present on admission.  Continue wound care.  Status post right BKA.  Left ABI obtained on 2/4 indicated noncompressible left lower extremity arteries.  Can follow-up with vascular surgery outpatient  History of gout: Continue allopurinol  Hypothyroidism: Continue levothyroxine  Pressure Injury 11/24/19 Heel Left Unstageable - Full thickness tissue loss in which the base of the injury is covered by slough (yellow, tan, gray, green or brown) and/or eschar (tan, brown or black) in the wound bed. (Active)  11/24/19 0655  Location: Heel  Location Orientation: Left  Staging: Unstageable - Full thickness tissue loss in which the base of the injury is covered by slough (yellow, tan, gray, green or brown) and/or eschar (tan, brown or black) in the wound bed.  Wound Description (Comments):   Present on Admission: Yes     Pressure Injury 09/02/19 Foot Left Unstageable - Full thickness tissue loss in which the base of the ulcer is  covered by slough (yellow, tan, gray, green or brown) and/or eschar (tan, brown or black) in the wound bed. (Active)  09/02/19 0815  Location: Foot  Location Orientation:  Left  Staging: Unstageable - Full thickness tissue loss in which the base of the ulcer is covered by slough (yellow, tan, gray, green or brown) and/or eschar (tan, brown or black) in the wound bed.  Wound Description (Comments):   Present on Admission: Yes        Nutrition Problem: Increased nutrient needs Etiology: wound healing      DVT prophylaxis:Heparin [place] Code Status: Full Family Communication: None Disposition Plan: Patient is from home.  PT/OT recommended skilled nursing facility.  Currently waiting for placement.hemodynamically  for discharge soon as bed is available.  Social worker closely following.   Consultants: ID, nephrology, GI  Procedures: EGD  Antimicrobials:  Anti-infectives (From admission, onward)   Start     Dose/Rate Route Frequency Ordered Stop   12/06/19 2000  DAPTOmycin (CUBICIN) 1,000 mg in sodium chloride 0.9 % IVPB     1,000 mg 240 mL/hr over 30 Minutes Intravenous Daily 12/06/19 0807 01/20/20 2359   12/05/19 1000  DAPTOmycin (CUBICIN) 1,000 mg in sodium chloride 0.9 % IVPB  Status:  Discontinued     1,000 mg 240 mL/hr over 30 Minutes Intravenous Every other day 12/04/19 1456 12/06/19 0807   11/26/19 1730  DAPTOmycin (CUBICIN) 1,000 mg in sodium chloride 0.9 % IVPB  Status:  Discontinued     1,000 mg 240 mL/hr over 30 Minutes Intravenous Every 24 hours 11/26/19 1642 12/04/19 1456      Subjective:  Patient seen and examined at the bed side this morning.  No active issues at this time.  Denies any complaints.  Waiting for placement.  Objective: Vitals:   12/10/19 1626 12/10/19 2025 12/11/19 0555 12/11/19 0929  BP: 140/74 135/68 136/74 136/79  Pulse: 61 (!) 55 (!) 57 79  Resp: 18 18 18 18   Temp: 97.9 F (36.6 C) 97.8 F (36.6 C) 98 F (36.7 C) 98.2 F (36.8 C)  TempSrc: Oral Oral Oral Oral  SpO2: 98% 100% 93% 98%  Weight:      Height:        Intake/Output Summary (Last 24 hours) at 12/11/2019 1139 Last data filed at 12/11/2019  1129 Gross per 24 hour  Intake 2990.05 ml  Output 4175 ml  Net -1184.95 ml   Filed Weights   12/03/19 2104 12/04/19 2107 12/07/19 2037  Weight: 111.2 kg 111.2 kg 112 kg    Examination:  General exam: Appears calm and comfortable ,Not in distress,average built HEENT:PERRL,Oral mucosa moist, Ear/Nose normal on gross exam Respiratory system: Bilateral equal air entry, normal vesicular breath sounds, no wheezes or crackles  Cardiovascular system: S1 & S2 heard, RRR. No JVD, murmurs, rubs, gallops or clicks.  Gastrointestinal system: Abdomen is nondistended, soft and nontender. No organomegaly or masses felt. Normal bowel sounds heard. Central nervous system: Alert and oriented. No focal neurological deficits. Extremities: No edema, no clubbing ,no cyanosis, right AKA, left lower extremity wrapped with dressing      Data Reviewed: I have personally reviewed following labs and imaging studies  CBC: Recent Labs  Lab 12/05/19 0515 12/06/19 0520 12/07/19 0539 12/10/19 0401  WBC 8.9 5.8 6.0 5.9  NEUTROABS 6.4  --  3.7 3.2  HGB 8.9* 8.5* 8.9* 9.6*  HCT 29.0* 27.0* 27.8* 29.2*  MCV 100.7* 98.9 98.9 95.4  PLT 248 235 259 0000000   Basic Metabolic Panel:  Recent Labs  Lab 12/05/19 0515 12/06/19 0520 12/07/19 0539 12/08/19 0356 12/10/19 0401  NA 139 137 135 134* 137  K 5.8* 5.3* 5.3* 5.2* 5.1  CL 106 102 106 101 100  CO2 23 24 21* 22 25  GLUCOSE 167* 172* 146* 143* 157*  BUN 38* 37* 40* 41* 49*  CREATININE 3.18* 3.01* 2.91* 2.86* 3.42*  CALCIUM 9.0 8.9 9.0 9.2 9.2  MG 1.9  --  1.6*  --   --   PHOS 5.5* 5.7* 5.4* 5.7*  --    GFR: Estimated Creatinine Clearance: 29.9 mL/min (A) (by C-G formula based on SCr of 3.42 mg/dL (H)). Liver Function Tests: Recent Labs  Lab 12/05/19 0515 12/06/19 0520 12/07/19 0539 12/08/19 0356  AST 17  --  18  --   ALT 12  --  13  --   ALKPHOS 76  --  80  --   BILITOT 0.5  --  0.5  --   PROT 7.0  --  6.8  --   ALBUMIN 2.7* 2.5* 2.7* 2.8*    No results for input(s): LIPASE, AMYLASE in the last 168 hours. No results for input(s): AMMONIA in the last 168 hours. Coagulation Profile: No results for input(s): INR, PROTIME in the last 168 hours. Cardiac Enzymes: Recent Labs  Lab 12/05/19 0515  CKTOTAL 29*   BNP (last 3 results) No results for input(s): PROBNP in the last 8760 hours. HbA1C: No results for input(s): HGBA1C in the last 72 hours. CBG: Recent Labs  Lab 12/10/19 1127 12/10/19 1626 12/10/19 2026 12/11/19 0715 12/11/19 1127  GLUCAP 137* 179* 132* 137* 137*   Lipid Profile: No results for input(s): CHOL, HDL, LDLCALC, TRIG, CHOLHDL, LDLDIRECT in the last 72 hours. Thyroid Function Tests: No results for input(s): TSH, T4TOTAL, FREET4, T3FREE, THYROIDAB in the last 72 hours. Anemia Panel: No results for input(s): VITAMINB12, FOLATE, FERRITIN, TIBC, IRON, RETICCTPCT in the last 72 hours. Sepsis Labs: No results for input(s): PROCALCITON, LATICACIDVEN in the last 168 hours.  Recent Results (from the past 240 hour(s))  Culture, Urine     Status: Abnormal   Collection Time: 12/03/19  4:49 PM   Specimen: Urine, Random  Result Value Ref Range Status   Specimen Description URINE, RANDOM  Final   Special Requests   Final    NONE Performed at [facility], [address], [place], [place] [address]    Culture MULTIPLE SPECIES PRESENT, SUGGEST RECOLLECTION (A)  Final   Report Status 12/04/2019 FINAL  Final  Culture, blood (routine x 2)     Status: None   Collection Time: 12/04/19 11:16 AM   Specimen: BLOOD  Result Value Ref Range Status   Specimen Description BLOOD RIGHT ANTECUBITAL  Final   Special Requests   Final    BOTTLES DRAWN AEROBIC AND ANAEROBIC Blood Culture adequate volume   Culture   Final    NO GROWTH 5 DAYS Performed at [facility], [place] [address], [place], [place] [address]    Report Status 12/09/2019 FINAL  Final  Culture, blood (routine x 2)     Status: None   Collection  Time: 12/04/19 11:39 AM   Specimen: BLOOD  Result Value Ref Range Status   Specimen Description BLOOD LEFT ANTECUBITAL  Final   Special Requests   Final    BOTTLES DRAWN AEROBIC AND ANAEROBIC Blood Culture adequate volume   Culture   Final    NO GROWTH 5 DAYS Performed at [facility], [place] [address], [place], [place] [address]    Report Status 12/09/2019 FINAL  Final         Radiology Studies: No results found.      Scheduled Meds: . allopurinol  100 mg Oral BID  . amLODipine  10 mg Oral Daily  . Chlorhexidine Gluconate Cloth  6 each Topical Daily  . cloNIDine  0.3 mg Oral TID  . feeding supplement (ENSURE ENLIVE)  237 mL Oral BID BM  . finasteride  5 mg Oral Daily  . furosemide  40 mg Oral Daily  . heparin injection (subcutaneous)  5,000 Units Subcutaneous Q8H  . hydrALAZINE  50 mg Oral Q8H  . insulin aspart  0-15 Units Subcutaneous TID WC  . insulin aspart  0-5 Units Subcutaneous QHS  . levothyroxine  50 mcg Oral Q0600  . lidocaine  2 patch Transdermal Q24H  . lidocaine-prilocaine   Topical Daily  . metoprolol tartrate  50 mg Oral BID  . nutrition supplement (JUVEN)  1 packet Oral BID BM  . oxyCODONE  30 mg Oral TID  . pantoprazole  40 mg Oral Daily  . sodium chloride flush  3 mL Intravenous Q12H  . sodium zirconium cyclosilicate  10 g Oral Daily  . tamsulosin  0.4 mg Oral Daily   Continuous Infusions: . sodium chloride Stopped (11/27/19 2024)  . sodium chloride 75 mL/hr at 12/11/19 0335  . DAPTOmycin (CUBICIN)  IV 1,000 mg (12/10/19 2030)     LOS: 17 days    Time spent:35 mins. More than 50% of that time was spent in counseling and/or coordination of care.      Shelly Coss, MD [facility] P2/21/2021, 11:39 AM

## 2019-12-11 NOTE — Plan of Care (Signed)
  Problem: Education: Goal: Knowledge of General Education information will improve Description Including pain rating scale, medication(s)/side effects and non-pharmacologic comfort measures Outcome: Progressing   

## 2019-12-12 LAB — BASIC METABOLIC PANEL
Anion gap: 13 (ref 5–15)
BUN: 49 mg/dL — ABNORMAL HIGH (ref 8–23)
CO2: 23 mmol/L (ref 22–32)
Calcium: 9 mg/dL (ref 8.9–10.3)
Chloride: 100 mmol/L (ref 98–111)
Creatinine, Ser: 3.77 mg/dL — ABNORMAL HIGH (ref 0.61–1.24)
GFR calc Af Amer: 18 mL/min — ABNORMAL LOW (ref >60)
GFR calc non Af Amer: 16 mL/min — ABNORMAL LOW (ref >60)
Glucose, Bld: 162 mg/dL — ABNORMAL HIGH (ref 70–99)
Potassium: 4.5 mmol/L (ref 3.5–5.1)
Sodium: 136 mmol/L (ref 135–145)

## 2019-12-12 LAB — GLUCOSE, CAPILLARY
Glucose-Capillary: 140 mg/dL — ABNORMAL HIGH (ref 70–99)
Glucose-Capillary: 177 mg/dL — ABNORMAL HIGH (ref 70–99)
Glucose-Capillary: 198 mg/dL — ABNORMAL HIGH (ref 70–99)
Glucose-Capillary: 172 mg/dL — ABNORMAL HIGH (ref 70–99)

## 2019-12-12 LAB — CK: Total CK: 54 U/L (ref 49–397)

## 2019-12-12 MED ORDER — SODIUM CHLORIDE 0.9 % IV SOLN
1000.0000 mg | INTRAVENOUS | Status: DC
  Administered 2019-12-13 – 2019-12-15 (×2): 1000 mg via INTRAVENOUS
  Filled 2019-12-12 (×2): qty 20

## 2019-12-12 MED ORDER — COLLAGENASE 250 UNIT/GM EX OINT
TOPICAL_OINTMENT | Freq: Every day | CUTANEOUS | Status: DC
  Filled 2019-12-12: qty 30

## 2019-12-12 NOTE — Progress Notes (Signed)
PROGRESS NOTE    Thomas [name]  [id number] DOB: May 03, 1951 DOA: 11/24/2019 PCP: Antonietta Jewel, MD   Brief Narrative: Patient is a 69 year old male with history of CVA, hypertension, hyperlipidemia, gout, diabetes mellitus, peripheral artery disease, status post right BKA, chronic anemia was admitted on 1/31 [facility] after a fall.  He was found to have left lower extremity wound/cellulitis with concern for outpatient treatment fluid clindamycin.  He was treated with daptomycin and was planned to transition to linezolid at DC.  On evening of 2/3 he deveopled lower abdomen pain with nausea and vomiting with coffee-ground emesis .  FOBT was positive and he was transferred to [facility] for GI evaluation.  On 2/5, lumbar spinal MRI showed progressive osteomyelitis and persistent paraspinal cellulitis and abscess at that time as T10-11 with epidural abscess.  Underwent IR guided drainage.  Cultures negative.  ID was following.  Current plan is to continue daptomycin with prolonged p.o. antibiotics afterwards.  Currently waiting for placement.  SW closely following. Patient is medically stable for discharge to skilled facility soon as bed is available.  Assessment & Plan:   Principal Problem:   Thoracic discitis Active Problems:   [name], type 2) (HCC)   Gout   HTN (hypertension)   Pressure injury of skin   Upper GI bleeding   BPH (benign prostatic hyperplasia)   CKD (chronic kidney disease), stage IV (HCC)   Chronic pain syndrome   Physical deconditioning   Epidural abscess   Urinary retention   T10-T11 discitis with epidural abscess/MRSA bacteremia: Was previously hospitalized here from 11/11-12/20 for T10-T 11 discitis and MRSA bacteremia.  Daptomycin was transitioned to dalbavancin at time of DC home.  MRI on this admission showed evidence epidural abscess.  Neurosurgery, infectious disease were following.  Status post IR guided drainage of epidural  abscess.  Infectious disease recommends 8 weeks of IV daptomycin, till 01/20/2020.  Upper GI bleeding/hematemesis/anemia chronic disease: Hemoglobin stable between 8-9.  Status post upper GI endoscopy by Dr. Benson Norway.  No evidence of acute bleeding source.  Continue PPI.  Regular diet.  Chronic pain syndrome: Continue supportive care, pain management.  BPH/acute urinary retention: Continue tamsulosin, finasteride.  He had urinary retention during this hospitalization.  On Foley catheter.  Follow-up with urology on discharge.  Suspected urinary tract infection: Urine cultures showed multiple species.  Blood cultures are negative so far.  CKD stage IV: Presented with AKI because of volume depletion.  Improved with IV fluids.  Creatinine creeping up. Held  Lasix.Check Bmp tomorrow  Hyperkalemia: Improved with Lokelma.  Hypertension: Above stable.  Continue current medications  Left heel/foot unstageable ulcer: Present on admission.  Continue wound care.  Status post right BKA.  Left ABI obtained on 2/4 indicated noncompressible left lower extremity arteries.  Can follow-up with vascular surgery outpatient.  History of gout: Continue allopurinol  Hypothyroidism: Continue levothyroxine  Pressure Injury 11/24/19 Heel Left Unstageable - Full thickness tissue loss in which the base of the injury is covered by slough (yellow, tan, gray, green or brown) and/or eschar (tan, brown or black) in the wound bed. (Active)  11/24/19 0655  Location: Heel  Location Orientation: Left  Staging: Unstageable - Full thickness tissue loss in which the base of the injury is covered by slough (yellow, tan, gray, green or brown) and/or eschar (tan, brown or black) in the wound bed.  Wound Description (Comments):   Present on Admission: Yes     Pressure Injury 09/02/19 Foot  Left Unstageable - Full thickness tissue loss in which the base of the ulcer is covered by slough (yellow, tan, gray, green or brown) and/or eschar  (tan, brown or black) in the wound bed. (Active)  09/02/19 0815  Location: Foot  Location Orientation: Left  Staging: Unstageable - Full thickness tissue loss in which the base of the ulcer is covered by slough (yellow, tan, gray, green or brown) and/or eschar (tan, brown or black) in the wound bed.  Wound Description (Comments):   Present on Admission: Yes        Nutrition Problem: Increased nutrient needs Etiology: wound healing      DVT prophylaxis:Heparin [place] Code Status: Full Family Communication: None Disposition Plan: Patient is from home.  PT/OT recommended skilled nursing facility.  Currently waiting for placement.hemodynamically  for discharge soon as bed is available.  Social worker closely following.   Consultants: ID, nephrology, GI  Procedures: EGD  Antimicrobials:  Anti-infectives (From admission, onward)   Start     Dose/Rate Route Frequency Ordered Stop   12/06/19 2000  DAPTOmycin (CUBICIN) 1,000 mg in sodium chloride 0.9 % IVPB     1,000 mg 240 mL/hr over 30 Minutes Intravenous Daily 12/06/19 0807 01/20/20 2359   12/05/19 1000  DAPTOmycin (CUBICIN) 1,000 mg in sodium chloride 0.9 % IVPB  Status:  Discontinued     1,000 mg 240 mL/hr over 30 Minutes Intravenous Every other day 12/04/19 1456 12/06/19 0807   11/26/19 1730  DAPTOmycin (CUBICIN) 1,000 mg in sodium chloride 0.9 % IVPB  Status:  Discontinued     1,000 mg 240 mL/hr over 30 Minutes Intravenous Every 24 hours 11/26/19 1642 12/04/19 1456      Subjective:  Patient seen and examined at the bed side this morning.  No active issues at this time.  Denies any complaints.  Waiting for placement.wound care consulted today  Objective: Vitals:   12/11/19 1639 12/11/19 1940 12/11/19 2201 12/12/19 0515  BP: (!) 143/68  (!) 146/80 136/71  Pulse: (!) 57  61 (!) 58  Resp: 18 18 18 18   Temp: 98.2 F (36.8 C)  98.2 F (36.8 C) 98.2 F (36.8 C)  TempSrc: Oral  Oral Oral  SpO2: 98%   100%  Weight:        Height:        Intake/Output Summary (Last 24 hours) at 12/12/2019 [id number] Last data filed at 12/12/2019 0500 Gross per 24 hour  Intake 720 ml  Output 2275 ml  Net -1555 ml   Filed Weights   12/03/19 2104 12/04/19 2107 12/07/19 2037  Weight: 111.2 kg 111.2 kg 112 kg    Examination:  General exam: Comfortable,  deconditioned, chronically debilitated Respiratory system: Bilateral equal air entry, normal vesicular breath sounds, no wheezes or crackles  Cardiovascular system: S1 & S2 heard, RRR. No JVD, murmurs, rubs, gallops or clicks. Gastrointestinal system: Abdomen is nondistended, soft and nontender. No organomegaly or masses felt. Normal bowel sounds heard. Central nervous system: Alert and oriented. No focal neurological deficits. Extremities: No edema, no clubbing ,no cyanosis, right AKA, left lower extremity wrapped with dressings    Data Reviewed: I have personally reviewed following labs and imaging studies  CBC: Recent Labs  Lab 12/06/19 0520 12/07/19 0539 12/10/19 0401  WBC 5.8 6.0 5.9  NEUTROABS  --  3.7 3.2  HGB 8.5* 8.9* 9.6*  HCT 27.0* 27.8* 29.2*  MCV 98.9 98.9 95.4  PLT 235 259 0000000   Basic Metabolic Panel: Recent Labs  Lab  12/06/19 0520 12/07/19 0539 12/08/19 0356 12/10/19 0401 12/12/19 0512  NA 137 135 134* 137 136  K 5.3* 5.3* 5.2* 5.1 4.5  CL 102 106 101 100 100  CO2 24 21* 22 25 23   GLUCOSE 172* 146* 143* 157* 162*  BUN 37* 40* 41* 49* 49*  CREATININE 3.01* 2.91* 2.86* 3.42* 3.77*  CALCIUM 8.9 9.0 9.2 9.2 9.0  MG  --  1.6*  --   --   --   PHOS 5.7* 5.4* 5.7*  --   --    GFR: Estimated Creatinine Clearance: 27.2 mL/min (A) (by C-G formula based on SCr of 3.77 mg/dL (H)). Liver Function Tests: Recent Labs  Lab 12/06/19 0520 12/07/19 0539 12/08/19 0356  AST  --  18  --   ALT  --  13  --   ALKPHOS  --  80  --   BILITOT  --  0.5  --   PROT  --  6.8  --   ALBUMIN 2.5* 2.7* 2.8*   No results for input(s): LIPASE, AMYLASE in the last  168 hours. No results for input(s): AMMONIA in the last 168 hours. Coagulation Profile: No results for input(s): INR, PROTIME in the last 168 hours. Cardiac Enzymes: Recent Labs  Lab 12/12/19 0512  CKTOTAL 54   BNP (last 3 results) No results for input(s): PROBNP in the last 8760 hours. HbA1C: No results for input(s): HGBA1C in the last 72 hours. CBG: Recent Labs  Lab 12/11/19 0715 12/11/19 1127 12/11/19 1638 12/11/19 2114 12/12/19 0712  GLUCAP 137* 137* 128* 172* 140*   Lipid Profile: No results for input(s): CHOL, HDL, LDLCALC, TRIG, CHOLHDL, LDLDIRECT in the last 72 hours. Thyroid Function Tests: No results for input(s): TSH, T4TOTAL, FREET4, T3FREE, THYROIDAB in the last 72 hours. Anemia Panel: No results for input(s): VITAMINB12, FOLATE, FERRITIN, TIBC, IRON, RETICCTPCT in the last 72 hours. Sepsis Labs: No results for input(s): PROCALCITON, LATICACIDVEN in the last 168 hours.  Recent Results (from the past 240 hour(s))  Culture, Urine     Status: Abnormal   Collection Time: 12/03/19  4:49 PM   Specimen: Urine, Random  Result Value Ref Range Status   Specimen Description URINE, RANDOM  Final   Special Requests   Final    NONE Performed at [facility], [address] [place], [place] [address]    Culture MULTIPLE SPECIES PRESENT, SUGGEST RECOLLECTION (A)  Final   Report Status 12/04/2019 FINAL  Final  Culture, blood (routine x 2)     Status: None   Collection Time: 12/04/19 11:16 AM   Specimen: BLOOD  Result Value Ref Range Status   Specimen Description BLOOD RIGHT ANTECUBITAL  Final   Special Requests   Final    BOTTLES DRAWN AEROBIC AND ANAEROBIC Blood Culture adequate volume   Culture   Final    NO GROWTH 5 DAYS Performed at [facility], [place] [address] [place], [place] [address]    Report Status 12/09/2019 FINAL  Final  Culture, blood (routine x 2)     Status: None   Collection Time: 12/04/19 11:39 AM   Specimen: BLOOD  Result Value  Ref Range Status   Specimen Description BLOOD LEFT ANTECUBITAL  Final   Special Requests   Final    BOTTLES DRAWN AEROBIC AND ANAEROBIC Blood Culture adequate volume   Culture   Final    NO GROWTH 5 DAYS Performed at [facility], [place] [address], [place], [place]  [id number]    Report Status 12/09/2019 FINAL  Final         Radiology Studies: No results found.      Scheduled Meds: . allopurinol  100 mg Oral BID  . amLODipine  10 mg Oral Daily  . Chlorhexidine Gluconate Cloth  6 each Topical Daily  . cloNIDine  0.3 mg Oral TID  . feeding supplement (ENSURE ENLIVE)  237 mL Oral BID BM  . finasteride  5 mg Oral Daily  . heparin injection (subcutaneous)  5,000 Units Subcutaneous Q8H  . hydrALAZINE  50 mg Oral Q8H  . insulin aspart  0-15 Units Subcutaneous TID WC  . insulin aspart  0-5 Units Subcutaneous QHS  . levothyroxine  50 mcg Oral Q0600  . lidocaine  2 patch Transdermal Q24H  . lidocaine-prilocaine   Topical Daily  . metoprolol tartrate  50 mg Oral BID  . nutrition supplement (JUVEN)  1 packet Oral BID BM  . oxyCODONE  30 mg Oral TID  . pantoprazole  40 mg Oral Daily  . sodium chloride flush  3 mL Intravenous Q12H  . sodium zirconium cyclosilicate  10 g Oral Daily  . tamsulosin  0.4 mg Oral Daily   Continuous Infusions: . sodium chloride Stopped (11/27/19 2024)  . sodium chloride 75 mL/hr at 12/12/19 0518  . DAPTOmycin (CUBICIN)  IV 1,000 mg (12/11/19 2009)     LOS: 18 days    Time spent:35 mins. More than 50% of that time was spent in counseling and/or coordination of care.      Shelly Coss, MD [facility] P2/22/2021, 8:07 AM

## 2019-12-12 NOTE — Progress Notes (Signed)
PHARMACY NOTE:  ANTIMICROBIAL RENAL DOSAGE ADJUSTMENT  Current antimicrobial regimen includes a mismatch between antimicrobial dosage and estimated renal function.  As per policy approved by the Pharmacy & Therapeutics and Medical Executive Committees, the antimicrobial dosage will be adjusted accordingly.  Current antimicrobial dosage:  Daptomycin 1000mg  IV q24h  Indication: progressive osteomyelitis and persistent cellulitis and abscess at T10-11 with probable epidural abscess  Renal Function:  Estimated Creatinine Clearance: 27.2 mL/min (A) (by C-G formula based on SCr of 3.77 mg/dL (H)). []      On intermittent HD, scheduled: []      On CRRT    Antimicrobial dosage has been changed to:  Daptomycin 1000mg  IV q48h  Additional comments:   Chyna Kneece A. Levada Dy, PharmD, BCPS, FNKF Clinical Pharmacist [place] Please utilize Amion for appropriate phone number to reach the unit pharmacist ([place])   12/12/2019 2:05 PM

## 2019-12-12 NOTE — TOC Progression Note (Signed)
Transition of Care [facility]) - Progression Note    Patient Details  Name: Thomas [name] MRN: [id number] Date of Birth: 08/08/1951  Transition of Care [facility]) CM/SW Contact  Thomas Salina Mila Homer, LCSW Phone Number: 12/12/2019, 3:53 PM  Clinical Narrative:  CSW followed up with Thomas [name], admissions director with [facility] and additional clinicals sent at [facility] request. Calls made to [employer] and message left for [facility], admissions Mudlogger.     Expected Discharge Plan: [place] Barriers to Discharge: SNF Pending bed offer  Expected Discharge Plan and Services Expected Discharge Plan: [place] arrangements for the past 2 months: Apartment                                       Social Determinants of Health (SDOH) Interventions    Readmission Risk Interventions Readmission Risk Prevention Plan 09/17/2019  Transportation Screening Complete  PCP or Specialist Appt within 5-7 Days Not Complete  Not Complete comments snf vs home  Home Care Screening Complete  Medication Review (RN CM) Complete  Some recent data might be hidden

## 2019-12-12 NOTE — Consult Note (Addendum)
[place] Nurse Consult Note: Reason for Consult: Consult requested for left foot wound.  Pt states he has been followed by the outpatient wound care center in [facility]  prior to admission and they have ordered "some cream." He has an ABI of 1.19 Wound type: Dry cracked peeling skin to anterior foot and toes and webbing between toes.  Pt has significant Charcot foot.  Measurement: Left plantar foot with chronic full thickness wound; 2.5X2.5cm, 100% dry tightly adhered eschar, no odor or drainage Dressing procedure/placement/frequency: Topical treatment orders provided for bedside nurses to perform daily as follows to assist with removal of nonviable tissue: Apply Santyl to left plantar foot wound Q day, then cover with moist 2X2 and ABD pad.  Apply xeroform gauze to anterior foot.  Cover with kerlex.  Float heel to reduce pressure.  Pt should resume follow-up with the outpatient wound center after discharge.  Please re-consult if further assistance is needed.  Thank-you,  Julien Girt MSN, [place], [place], [place], [place]

## 2019-12-13 LAB — BASIC METABOLIC PANEL
Anion gap: 8 (ref 5–15)
BUN: 48 mg/dL — ABNORMAL HIGH (ref 8–23)
CO2: 27 mmol/L (ref 22–32)
Calcium: 9.1 mg/dL (ref 8.9–10.3)
Chloride: 101 mmol/L (ref 98–111)
Creatinine, Ser: 3.53 mg/dL — ABNORMAL HIGH (ref 0.61–1.24)
GFR calc Af Amer: 20 mL/min — ABNORMAL LOW (ref >60)
GFR calc non Af Amer: 17 mL/min — ABNORMAL LOW (ref >60)
Glucose, Bld: 151 mg/dL — ABNORMAL HIGH (ref 70–99)
Potassium: 4.9 mmol/L (ref 3.5–5.1)
Sodium: 136 mmol/L (ref 135–145)

## 2019-12-13 LAB — GLUCOSE, CAPILLARY
Glucose-Capillary: 150 mg/dL — ABNORMAL HIGH (ref 70–99)
Glucose-Capillary: 162 mg/dL — ABNORMAL HIGH (ref 70–99)
Glucose-Capillary: 179 mg/dL — ABNORMAL HIGH (ref 70–99)
Glucose-Capillary: 154 mg/dL — ABNORMAL HIGH (ref 70–99)

## 2019-12-13 NOTE — Progress Notes (Signed)
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 Patient transferred from [facility] for MRSA bacteremia. CT abdomen showing discitis involvling T10, T11 area.   OT comments  Pt progressing towards OT goals with pain medication provided prior to therapy session. Pt Min A at most for bed mobility during session. Guided pt in bathing task sitting EOB with Supervision for UB bathing, Min A for LB bathing to ensure thoroughness. Pt applied compensatory strategies, rolling side to side in bed for majority of LB bathing. Pt setup to don new hospital gown and pt able to don R prosthetic LE without assistance. Pt min guard for squat pivot to BSC, Mod A for return back to bed due to fatigue and transferring to weaker side (L foot with wounds). Pt impulsive with movements at times, but overall cooperative and attentive during session. Will continue to follow acutely.  Follow Up Recommendations  SNF    Equipment Recommendations  Other (comment)    Recommendations for Other Services      Precautions / Restrictions Precautions Precautions: Fall Precaution Booklet Issued: No Restrictions Weight Bearing Restrictions: No Other Position/Activity Restrictions: No WB orders for LLE, but with 2 significant foot wounds. PT encouraged minimal WB through LLE for skin integrity. Pt insensate in L foot       Mobility Bed Mobility Overal bed mobility: Needs Assistance Bed Mobility: Rolling;Supine to Sit;Sit to Supine Rolling: Min guard Sidelying to sit: Supervision Supine to sit: Min guard;HOB elevated Sit to supine: Min assist;HOB elevated   General bed mobility comments: Min  guard for rolling and supine to sit with use of bedrails to assist. Min assist for return to supine for lifting LEs into bed, pt able to scoot self up with use of bedrails.  Transfers Overall transfer level: Needs assistance   Transfers: Squat Pivot Transfers;Sit to/from Stand Sit to Stand: Mod assist;+2 physical assistance;+2 safety/equipment   Squat pivot transfers: Min assist;Mod assist;+2 physical assistance;+2 safety/equipment;From elevated surface     General transfer comment: mod assist +2 for attempted stand, pt unable to come to full standing due to LE weakness and PT reinforcing minimal WB through LLE. Min-mod assist for squat pivot bed<>BSC for guiding hips to destination surface, initial power up, and steadying.    Balance Overall balance assessment: Needs assistance Sitting-balance support: Feet supported;Bilateral upper extremity supported Sitting balance-[name]: Fair Sitting balance - Comments: posterior leaning with LE movement   Standing balance support: Bilateral upper extremity supported;During functional activity Standing balance-[name]: Zero Standing balance comment: unable to come to standing with +2                           ADL either performed or assessed with clinical judgement   ADL Overall ADL's : Needs assistance/impaired     Grooming: Set up;Sitting   Upper Body Bathing: Supervision/ safety;Sitting Upper Body Bathing Details (indicate cue type and reason): Pt supervision for UB bathing sitting EOB Lower Body Bathing: Minimal assistance;Sitting/lateral leans;Sit to/from stand Lower Body Bathing Details (indicate cue type and reason): Pt Min A for LB bathing to ensure cleanliness of posterior region. Pt completing majority of LB bathing rolling side to  side in bed Upper Body Dressing : Set up;Sitting       Toilet Transfer: Min guard;Moderate assistance;+2 for physical assistance;Squat-pivot;BSC Toilet Transfer Details (indicate  cue type and reason): Pt initially min guard for quick squat pivot to BSC without AD, BSC to bed was Mod A due to fatigue           General ADL Comments: Pt with improved participation in ADLs today completing sponge bath sitting EOB.      Vision       Perception     Praxis      Cognition Arousal/Alertness: Awake/alert Behavior During Therapy: Impulsive Overall Cognitive Status: Within Functional Limits for tasks assessed Area of Impairment: Safety/judgement                         Safety/Judgement: Decreased awareness of safety;Decreased awareness of deficits     General Comments: Pt moves somewhat quickly and impulsively, requires verbal cuing to wait for PT/OT assist. Pt states "don't help me" when pt attempting to transfer, but requires physical assist.        Exercises General Exercises - Lower Extremity Long Arc Quad: AROM;Both;10 reps;Seated   Shoulder Instructions       General Comments scratches along low back, pt reports his back is very itchy and he has been scratching frequently. NT notified and shown area of concern.    Pertinent Vitals/ Pain       Pain Assessment: Faces Faces Pain Scale: Hurts even more Pain Location: back with mobility Pain Descriptors / Indicators: Guarding;Grimacing;Moaning Pain Intervention(s): Premedicated before session  Home Living                                          Prior Functioning/Environment              Frequency  Min 2X/week        Progress Toward Goals  OT Goals(current goals can now be found in the care plan section)  Progress towards OT goals: Progressing toward goals  Acute Rehab OT Goals Patient Stated Goal: get home OT Goal Formulation: With patient Time For Goal Achievement: 12/12/19 Potential to Achieve Goals: Fair ADL Goals Pt Will Perform Grooming: with set-up;with supervision;sitting Pt Will Perform Upper Body Bathing: with set-up;sitting Pt Will Perform  Lower Body Bathing: with min assist;with adaptive equipment Pt Will Perform Upper Body Dressing: with supervision;with set-up;sitting Pt Will Perform Lower Body Dressing: with mod assist;with min assist;with adaptive equipment Pt Will Transfer to Toilet: with mod assist;with min assist;ambulating Pt Will Perform Toileting - Clothing Manipulation and hygiene: with mod assist;with min assist;sit to/from stand  Plan Frequency remains appropriate;Discharge plan needs to be updated    Co-evaluation    PT/OT/SLP Co-Evaluation/Treatment: Yes Reason for Co-Treatment: Complexity of the patient's impairments (multi-system involvement);For patient/therapist safety PT goals addressed during session: Mobility/safety with mobility;Balance OT goals addressed during session: ADL's and self-care      AM-PAC OT "6 Clicks" Daily Activity     Outcome Measure   Help from another person eating meals?: None Help from another person taking care of personal grooming?: A Little Help from another person toileting, which includes using toliet, bedpan, or urinal?: Total Help from another person bathing (including washing, rinsing, drying)?: A Little Help from another person to put on and taking off regular upper body clothing?: A Little Help from  another person to put on and taking off regular lower body clothing?: A Lot 6 Click Score: 16    End of Session Equipment Utilized During Treatment: Gait belt  OT Visit Diagnosis: Other abnormalities of gait and mobility (R26.89);Pain   Activity Tolerance Patient tolerated treatment well   Patient Left in bed;with call bell/phone within reach;with bed alarm set   Nurse Communication Mobility status;Other (comment)(ADL status, and pt requesting more pain meds)        Time: [id number] OT Time Calculation (min): 43 min  Charges: OT General Charges $OT Visit: 1 Visit OT Treatments $Self Care/Home Management : 23-37 mins  Layla Maw, OTR/L   Layla Maw 12/13/2019, 2:15 PM

## 2019-12-13 NOTE — Progress Notes (Signed)
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 Patient transferred from [facility] for MRSA bacteremia. CT abdomen showing discitis involvling T10, T11 area.    PT Comments    Pt with good mobility progression and activity tolerance this session, tolerating ADLs with OT at EOB, LE exercises, and transfer OOB and back to bed via squat pivot with moderate physical assist from both PT and OT. Pt requires increased time and rest breaks to mobilize, but PT expects pt to continue to progress well with mobilizing OOB. PT to continue to follow acutely, will continue to progress pt mobility as tolerated.    Follow Up Recommendations  SNF     Equipment Recommendations  None recommended by PT    Recommendations for Other Services       Precautions / Restrictions Precautions Precautions: Fall;Back Precaution Booklet Issued: No Restrictions Weight Bearing Restrictions: No Other Position/Activity Restrictions: No WB orders for LLE, but with 2 significant foot wounds. PT encouraged minimal WB through LLE for skin integrity. Pt insensate in L foot    Mobility  Bed Mobility Overal bed mobility: Needs Assistance Bed Mobility: Rolling;Supine to Sit;Sit to Supine Rolling: Min guard   Supine to sit: Min guard;HOB elevated Sit to supine: Min assist;HOB elevated   General bed mobility comments: Min guard for rolling and supine to sit with use of bedrails to assist. Min assist for return to supine for lifting LEs into bed, pt able to scoot self up with use of bedrails.  Transfers Overall transfer level: Needs assistance   Transfers: Squat Pivot Transfers;Sit to/from  Stand Sit to Stand: Mod assist;+2 physical assistance;+2 safety/equipment   Squat pivot transfers: Min assist;Mod assist;+2 physical assistance;+2 safety/equipment;From elevated surface     General transfer comment: mod assist +2 for attempted stand, pt unable to come to full standing due to LE weakness and PT reinforcing minimal WB through LLE. Min-mod assist for squat pivot bed<>BSC for guiding hips to destination surface, initial power up, and steadying.  Ambulation/Gait             General Gait Details: unable   Stairs             Wheelchair Mobility    Modified Rankin (Stroke Patients Only)       Balance Overall balance assessment: Needs assistance Sitting-balance support: Feet supported;Bilateral upper extremity supported Sitting balance-[name]: Fair Sitting balance - Comments: posterior leaning with LE movement   Standing balance support: Bilateral upper extremity supported;During functional activity Standing balance-[name]: Zero Standing balance comment: unable to come to standing with +2                            Cognition Arousal/Alertness: Awake/alert Behavior During Therapy: Impulsive Overall Cognitive Status: Within Functional Limits for tasks assessed Area of Impairment: Safety/judgement                         Safety/Judgement: Decreased awareness of safety;Decreased awareness of deficits     General Comments: Pt moves somewhat quickly and impulsively, requires verbal cuing to wait for PT/OT assist. Pt states "don't help me" when pt attempting  to transfer, but requires physical assist.      Exercises General Exercises - Lower Extremity Long Arc Quad: AROM;Both;10 reps;Seated    General Comments General comments (skin integrity, edema, etc.): scratches along low back, pt reports his back is very itchy and he has been scratching frequently. NT notified and shown area of concern.      Pertinent Vitals/Pain  Pain Assessment: Faces Faces Pain Scale: Hurts even more Pain Location: back with mobility Pain Descriptors / Indicators: Guarding;Grimacing;Moaning Pain Intervention(s): Limited activity within patient's tolerance;Monitored during session;Premedicated before session;Repositioned;Patient requesting pain meds-RN notified;Relaxation    Home Living                      Prior Function            PT Goals (current goals can now be found in the care plan section) Acute Rehab PT Goals Patient Stated Goal: get home PT Goal Formulation: With patient Time For Goal Achievement: 12/20/19 Potential to Achieve Goals: Good Progress towards PT goals: Progressing toward goals    Frequency    Min 2X/week      PT Plan Current plan remains appropriate    Co-evaluation PT/OT/SLP Co-Evaluation/Treatment: Yes Reason for Co-Treatment: For patient/therapist safety;To address functional/ADL transfers PT goals addressed during session: Mobility/safety with mobility;Balance        AM-PAC PT "6 Clicks" Mobility   Outcome Measure  Help needed turning from your back to your side while in a flat bed without using bedrails?: None Help needed moving from lying on your back to sitting on the side of a flat bed without using bedrails?: A Little Help needed moving to and from a bed to a chair (including a wheelchair)?: A Little Help needed standing up from a chair using your arms (e.g., wheelchair or bedside chair)?: A Lot Help needed to walk in hospital room?: Total Help needed climbing 3-5 steps with a railing? : Total 6 Click Score: 14    End of Session Equipment Utilized During Treatment: Gait belt Activity Tolerance: Patient limited by fatigue;Patient limited by pain Patient left: in bed;with call bell/phone within reach;with bed alarm set Nurse Communication: Mobility status PT Visit Diagnosis: History of falling (Z91.81);Pain Pain - part of body: (back)     Time: [id number] PT  Time Calculation (min) (ACUTE ONLY): 43 min  Charges:  $Therapeutic Activity: 8-22 mins                    Jleigh Striplin E, PT Acute Rehabilitation Services Pager [phone]  Office [phone]   Lamari Beckles D Samer Dutton 12/13/2019, 1:27 PM

## 2019-12-13 NOTE — Progress Notes (Signed)
PROGRESS NOTE    Thomas [name]  [id number] DOB: May 26, 1951 DOA: 11/24/2019 PCP: Antonietta Jewel, MD   Brief Narrative: Patient is a 69 year old male with history of CVA, hypertension, hyperlipidemia, gout, diabetes mellitus, peripheral artery disease, status post right BKA, chronic anemia was admitted on 1/31 [facility] after a fall.  He was found to have left lower extremity wound/cellulitis with concern for outpatient treatment fluid clindamycin.  He was treated with daptomycin and was planned to transition to linezolid at DC.  On evening of 2/3 he deveopled lower abdomen pain with nausea and vomiting with coffee-ground emesis .  FOBT was positive and he was transferred to [facility] for GI evaluation.  On 2/5, lumbar spinal MRI showed progressive osteomyelitis and persistent paraspinal cellulitis and abscess at that time as T10-11 with epidural abscess.  Underwent IR guided drainage.  Cultures negative.  ID was following.  Current plan is to continue daptomycin with prolonged p.o. antibiotics afterwards.  Currently waiting for placement.  SW closely following. Patient is medically stable for discharge to skilled facility soon as bed is available.  Assessment & Plan:   Principal Problem:   Thoracic discitis Active Problems:   [name], type 2) (HCC)   Gout   HTN (hypertension)   Pressure injury of skin   Upper GI bleeding   BPH (benign prostatic hyperplasia)   CKD (chronic kidney disease), stage IV (HCC)   Chronic pain syndrome   Physical deconditioning   Epidural abscess   Urinary retention   T10-T11 discitis with epidural abscess/MRSA bacteremia: Was previously hospitalized here from 11/11-12/20 for T10-T 11 discitis and MRSA bacteremia.  Daptomycin was transitioned to dalbavancin at time of DC home.  MRI on this admission showed evidence epidural abscess.  Neurosurgery, infectious disease were following.  Status post IR guided drainage of epidural  abscess.  Infectious disease recommends 8 weeks of IV daptomycin, till 01/20/2020.  Upper GI bleeding/hematemesis/anemia chronic disease: Hemoglobin stable between 8-9.  Status post upper GI endoscopy by Dr. Benson Norway.  No evidence of acute bleeding source.  Continue PPI.  Regular diet.  Chronic pain syndrome: Continue supportive care, pain management.  BPH/acute urinary retention: Continue tamsulosin, finasteride.  He had urinary retention during this hospitalization.  On Foley catheter.  Follow-up with urology on discharge.  Suspected urinary tract infection: Urine cultures showed multiple species.  Blood cultures are negative so far.  CKD stage IV: Presented with AKI because of volume depletion.  Improved with IV fluids. Held  Lasix.Check Bmp tomorrow.Cr in the range of 3 on baseline.He follows with nephrology at [facility].  Hyperkalemia: Improved with Lokelma.  Hypertension: Above stable.  Continue current medications  Left heel/foot unstageable ulcer: Present on admission.  Continue wound care.  Status post right BKA.  Left ABI obtained on 2/4 indicated noncompressible left lower extremity arteries.  Can follow-up with vascular surgery outpatient.  History of gout: Continue allopurinol  Hypothyroidism: Continue levothyroxine  Pressure Injury 11/24/19 Foot Left Unstageable - Full thickness tissue loss in which the base of the injury is covered by slough (yellow, tan, gray, green or brown) and/or eschar (tan, brown or black) in the wound bed. this is a full thickness wound, not (Active)  11/24/19 0655  Location: Foot  Location Orientation: Left  Staging: Unstageable - Full thickness tissue loss in which the base of the injury is covered by slough (yellow, tan, gray, green or brown) and/or eschar (tan, brown or black) in the wound bed.  Wound  Description (Comments): this is a full thickness wound, not a pressure injury  Present on Admission: Yes     Pressure Injury 09/02/19 Foot Left  Unstageable - Full thickness tissue loss in which the base of the ulcer is covered by slough (yellow, tan, gray, green or brown) and/or eschar (tan, brown or black) in the wound bed. (Active)  09/02/19 0815  Location: Foot  Location Orientation: Left  Staging: Unstageable - Full thickness tissue loss in which the base of the ulcer is covered by slough (yellow, tan, gray, green or brown) and/or eschar (tan, brown or black) in the wound bed.  Wound Description (Comments):   Present on Admission: Yes        Nutrition Problem: Increased nutrient needs Etiology: wound healing      DVT prophylaxis:Heparin  Code Status: Full Family Communication: None Disposition Plan: Patient is from home.  PT/OT recommended skilled nursing facility.  Currently waiting for placement.Hemodynamically  Stable for discharge soon as bed is available.  Social worker closely following.   Consultants: ID, nephrology, GI  Procedures: EGD  Antimicrobials:  Anti-infectives (From admission, onward)   Start     Dose/Rate Route Frequency Ordered Stop   12/13/19 2009  DAPTOmycin (CUBICIN) 1,000 mg in sodium chloride 0.9 % IVPB     1,000 mg 240 mL/hr over 30 Minutes Intravenous Every 48 hours 12/12/19 1408     12/06/19 2000  DAPTOmycin (CUBICIN) 1,000 mg in sodium chloride 0.9 % IVPB  Status:  Discontinued     1,000 mg 240 mL/hr over 30 Minutes Intravenous Daily 12/06/19 0807 12/12/19 1408   12/05/19 1000  DAPTOmycin (CUBICIN) 1,000 mg in sodium chloride 0.9 % IVPB  Status:  Discontinued     1,000 mg 240 mL/hr over 30 Minutes Intravenous Every other day 12/04/19 1456 12/06/19 0807   11/26/19 1730  DAPTOmycin (CUBICIN) 1,000 mg in sodium chloride 0.9 % IVPB  Status:  Discontinued     1,000 mg 240 mL/hr over 30 Minutes Intravenous Every 24 hours 11/26/19 1642 12/04/19 1456      Subjective:  Patient seen and examined at the bedside this morning.  Hemodynamically stable.  No active issues.  No changes from  yesterday but complains of some back pain  Objective: Vitals:   12/12/19 1615 12/12/19 2107 12/13/19 0628 12/13/19 0926  BP: 127/63 (!) 144/82 138/78 129/66  Pulse: (!) 58 (!) 59 60 (!) 54  Resp: 18 18 18 18   Temp: 98.2 F (36.8 C) 98.1 F (36.7 C) 98 F (36.7 C) 98 F (36.7 C)  TempSrc: Oral Oral Oral Oral  SpO2: 100% 98% 98% 100%  Weight:  113 kg    Height:        Intake/Output Summary (Last 24 hours) at 12/13/2019 1150 Last data filed at 12/13/2019 [id number] Gross per 24 hour  Intake 2717.62 ml  Output 2400 ml  Net 317.62 ml   Filed Weights   12/04/19 2107 12/07/19 2037 12/12/19 2107  Weight: 111.2 kg 112 kg 113 kg    Examination:  General exam: Comfortable,  deconditioned, chronically debilitated Respiratory system: Bilateral equal air entry, normal vesicular breath sounds, no wheezes or crackles  Cardiovascular system: S1 & S2 heard, RRR. No JVD, murmurs, rubs, gallops or clicks. Gastrointestinal system: Abdomen is nondistended, soft and nontender. No organomegaly or masses felt. Normal bowel sounds heard. Central nervous system: Alert and oriented. No focal neurological deficits. Extremities: No edema, no clubbing ,no cyanosis, right AKA, left lower extremity wrapped with dressings  Data Reviewed: I have personally reviewed following labs and imaging studies  CBC: Recent Labs  Lab 12/07/19 0539 12/10/19 0401  WBC 6.0 5.9  NEUTROABS 3.7 3.2  HGB 8.9* 9.6*  HCT 27.8* 29.2*  MCV 98.9 95.4  PLT 259 0000000   Basic Metabolic Panel: Recent Labs  Lab 12/07/19 0539 12/08/19 0356 12/10/19 0401 12/12/19 0512 12/13/19 0355  NA 135 134* 137 136 136  K 5.3* 5.2* 5.1 4.5 4.9  CL 106 101 100 100 101  CO2 21* 22 25 23 27   GLUCOSE 146* 143* 157* 162* 151*  BUN 40* 41* 49* 49* 48*  CREATININE 2.91* 2.86* 3.42* 3.77* 3.53*  CALCIUM 9.0 9.2 9.2 9.0 9.1  MG 1.6*  --   --   --   --   PHOS 5.4* 5.7*  --   --   --    GFR: Estimated Creatinine Clearance: 29.1 mL/min  (A) (by C-G formula based on SCr of 3.53 mg/dL (H)). Liver Function Tests: Recent Labs  Lab 12/07/19 0539 12/08/19 0356  AST 18  --   ALT 13  --   ALKPHOS 80  --   BILITOT 0.5  --   PROT 6.8  --   ALBUMIN 2.7* 2.8*   No results for input(s): LIPASE, AMYLASE in the last 168 hours. No results for input(s): AMMONIA in the last 168 hours. Coagulation Profile: No results for input(s): INR, PROTIME in the last 168 hours. Cardiac Enzymes: Recent Labs  Lab 12/12/19 0512  CKTOTAL 54   BNP (last 3 results) No results for input(s): PROBNP in the last 8760 hours. HbA1C: No results for input(s): HGBA1C in the last 72 hours. CBG: Recent Labs  Lab 12/12/19 1104 12/12/19 1613 12/12/19 2106 12/13/19 0651 12/13/19 1121  GLUCAP 177* 198* 172* 150* 162*   Lipid Profile: No results for input(s): CHOL, HDL, LDLCALC, TRIG, CHOLHDL, LDLDIRECT in the last 72 hours. Thyroid Function Tests: No results for input(s): TSH, T4TOTAL, FREET4, T3FREE, THYROIDAB in the last 72 hours. Anemia Panel: No results for input(s): VITAMINB12, FOLATE, FERRITIN, TIBC, IRON, RETICCTPCT in the last 72 hours. Sepsis Labs: No results for input(s): PROCALCITON, LATICACIDVEN in the last 168 hours.  Recent Results (from the past 240 hour(s))  Culture, Urine     Status: Abnormal   Collection Time: 12/03/19  4:49 PM   Specimen: Urine, Random  Result Value Ref Range Status   Specimen Description URINE, RANDOM  Final   Special Requests   Final    NONE Performed at [facility], [address], [place], [place] [address]    Culture MULTIPLE SPECIES PRESENT, SUGGEST RECOLLECTION (A)  Final   Report Status 12/04/2019 FINAL  Final  Culture, blood (routine x 2)     Status: None   Collection Time: 12/04/19 11:16 AM   Specimen: BLOOD  Result Value Ref Range Status   Specimen Description BLOOD RIGHT ANTECUBITAL  Final   Special Requests   Final    BOTTLES DRAWN AEROBIC AND ANAEROBIC Blood Culture adequate  volume   Culture   Final    NO GROWTH 5 DAYS Performed at [facility], [place] [address], [place], [place] [address]    Report Status 12/09/2019 FINAL  Final  Culture, blood (routine x 2)     Status: None   Collection Time: 12/04/19 11:39 AM   Specimen: BLOOD  Result Value Ref Range Status   Specimen Description BLOOD LEFT ANTECUBITAL  Final   Special Requests  Final    BOTTLES DRAWN AEROBIC AND ANAEROBIC Blood Culture adequate volume   Culture   Final    NO GROWTH 5 DAYS Performed at [facility], [place] [address], [place], [place] [address]    Report Status 12/09/2019 FINAL  Final         Radiology Studies: No results found.      Scheduled Meds: . allopurinol  100 mg Oral BID  . amLODipine  10 mg Oral Daily  . Chlorhexidine Gluconate Cloth  6 each Topical Daily  . cloNIDine  0.3 mg Oral TID  . collagenase   Topical Daily  . feeding supplement (ENSURE ENLIVE)  237 mL Oral BID BM  . finasteride  5 mg Oral Daily  . heparin injection (subcutaneous)  5,000 Units Subcutaneous Q8H  . hydrALAZINE  50 mg Oral Q8H  . insulin aspart  0-15 Units Subcutaneous TID WC  . insulin aspart  0-5 Units Subcutaneous QHS  . levothyroxine  50 mcg Oral Q0600  . lidocaine  2 patch Transdermal Q24H  . lidocaine-prilocaine   Topical Daily  . metoprolol tartrate  50 mg Oral BID  . nutrition supplement (JUVEN)  1 packet Oral BID BM  . oxyCODONE  30 mg Oral TID  . pantoprazole  40 mg Oral Daily  . sodium chloride flush  3 mL Intravenous Q12H  . sodium zirconium cyclosilicate  10 g Oral Daily  . tamsulosin  0.4 mg Oral Daily   Continuous Infusions: . sodium chloride Stopped (11/27/19 2024)  . DAPTOmycin (CUBICIN)  IV       LOS: 19 days    Time spent:15 mins. More than 50% of that time was spent in counseling and/or coordination of care.      Shelly Coss, MD [facility] P2/23/2021, 11:50 AM

## 2019-12-13 NOTE — TOC Progression Note (Addendum)
Transition of Care [facility]) - Progression Note    Patient Details  Name: GURANSH GREELEY MRN: [id number] Date of Birth: 1951-02-08  [facility]) CM/SW Contact  Sharlet Salina Mila Homer, LCSW Phone Number: 12/13/2019, 4:26 PM  Clinical Narrative:  Facility search continues for patient and no bed offers to date Calls made to [place], care transition nurse with [facility] [phone]) and with Raquel Sarna, admissions director with [facility] [phone]) regarding patient and the need for ST rehab. Both facilities were sent clinicals for review. Margarita Grizzle with [facility] indicated that they can accept patient, and asked if he would d/c on daptomycin, as it is expensive, "It's over $10,000 for 15 days", per Margarita Grizzle. Raquel Sarna with [facility] asked the same. MD contacted and indicated that he will d/c on this antibiotic, "no choice". CSW advised by Margarita Grizzle with [facility] that they cannot offer a bed. CSW will continue working on SNF placement.     Expected Discharge Plan: Skilled Nursing Facility Barriers to Discharge: SNF Pending bed offer  Expected Discharge Plan and Services Expected Discharge Plan: [place] arrangements for the past 2 months: Apartment                                     Social Determinants of Health (SDOH) Interventions  No SDOH interventions requested or needed at this time.  Readmission Risk Interventions Readmission Risk Prevention Plan 09/17/2019  Transportation Screening Complete  PCP or Specialist Appt within 5-7 Days Not Complete  Not Complete comments snf vs home  Home Care Screening Complete  Medication Review (RN CM) Complete  Some recent data might be hidden

## 2019-12-13 NOTE — Progress Notes (Signed)
Nutrition Follow up  DOCUMENTATION CODES:   Not applicable  INTERVENTION:    Continue Ensure Enlive po BID, each supplement provides 350 kcal and 20 grams of protein  Continue Juven Fruit Punch BID, each serving provides 95kcal and 2.5g of protein (amino acids glutamine and arginine)  NUTRITION DIAGNOSIS:   Increased nutrient needs related to wound healing as evidenced by estimated needs.  Ongoing  GOAL:   Patient will meet greater than or equal to 90% of their needs   Progressing   MONITOR:   Diet advancement, PO intake, Supplement acceptance, Weight trends, Labs, I & O's, Skin  REASON FOR ASSESSMENT:   Consult Wound healing  ASSESSMENT:   Patient with PMH significant for CVA, HTN, HLD, DM, DM, PAD s/p R BKA, and chronic back pain. Presents this admission with hematemesis, diskitis, and LLE wounds.   Appetite remains stable. Meal completions charted as 75-100% for his last eight meals. Drinking Juven and Ensure BID. Potassium now wdl. Stable for d/c to SNF once bed available.   Admission weight: 109.1 kg  Current weight: 113 kg   I/O: -7,858 ml since 2/9 UOP: 2,400 ml x 24 hrs   Medications: SS novolog, lokelma  Labs: CBG 140-198  Diet Order:   Diet Order            Diet regular Room service appropriate? Yes; Fluid consistency: Thin  Diet effective now              EDUCATION NEEDS:   Not appropriate for education at this time  Skin:  Skin Assessment: Skin Integrity Issues: Skin Integrity Issues:: Unstageable Unstageable: L heel  Last BM:  2/21  Height:   Ht Readings from Last 1 Encounters:  11/24/19 6\' 6"  (1.981 m)    Weight:   Wt Readings from Last 1 Encounters:  12/12/19 113 kg    Ideal Body Weight:  91 kg(adjusted R BKA)  BMI:  Body mass index is 28.79 kg/m.  Estimated Nutritional Needs:   Kcal:  2400-2600 kcal  Protein:  120-135 grams  Fluid:  >/= 2.4 L/day   Mariana Single RD, LDN Clinical Nutrition Pager listed in  [place]

## 2019-12-14 LAB — BASIC METABOLIC PANEL
Anion gap: 11 (ref 5–15)
BUN: 51 mg/dL — ABNORMAL HIGH (ref 8–23)
CO2: 26 mmol/L (ref 22–32)
Calcium: 9.1 mg/dL (ref 8.9–10.3)
Chloride: 99 mmol/L (ref 98–111)
Creatinine, Ser: 3.7 mg/dL — ABNORMAL HIGH (ref 0.61–1.24)
GFR calc Af Amer: 19 mL/min — ABNORMAL LOW (ref >60)
GFR calc non Af Amer: 16 mL/min — ABNORMAL LOW (ref >60)
Glucose, Bld: 193 mg/dL — ABNORMAL HIGH (ref 70–99)
Potassium: 5 mmol/L (ref 3.5–5.1)
Sodium: 136 mmol/L (ref 135–145)

## 2019-12-14 LAB — GLUCOSE, CAPILLARY
Glucose-Capillary: 175 mg/dL — ABNORMAL HIGH (ref 70–99)
Glucose-Capillary: 172 mg/dL — ABNORMAL HIGH (ref 70–99)
Glucose-Capillary: 173 mg/dL — ABNORMAL HIGH (ref 70–99)
Glucose-Capillary: 185 mg/dL — ABNORMAL HIGH (ref 70–99)

## 2019-12-14 MED ORDER — SODIUM CHLORIDE 0.9 % IV SOLN: INTRAVENOUS | Status: DC

## 2019-12-14 MED ORDER — MINERAL OIL RE ENEM
1.0000 | ENEMA | Freq: Once | RECTAL | Status: AC
  Administered 2019-12-14: 1 via RECTAL
  Filled 2019-12-14: qty 1

## 2019-12-14 MED ORDER — POLYETHYLENE GLYCOL 3350 17 G PO PACK
17.0000 g | PACK | Freq: Two times a day (BID) | ORAL | Status: DC
  Administered 2019-12-14 – 2019-12-17 (×6): 17 g via ORAL
  Filled 2019-12-14 (×6): qty 1

## 2019-12-14 NOTE — TOC Progression Note (Addendum)
Transition of Care [facility]) - Progression Note    Patient Details  Name: WYLAND GANTT MRN: [id number] Date of Birth: 19-Jul-1951  Transition of Care [facility]) CM/SW Contact  Sharlet Salina Mila Homer, LCSW Phone Number: 12/14/2019, 10:09 AM  Clinical Narrative: Received a text this morning from [facility], admissions director with [facility], "we are unable to take Mr. Mccoig. The dapto is too expensive. That is really the only issue." MD advised on 2/23 by CSW that SNF placement will be challenging because of the cost of daptomycin.      Expected Discharge Plan: Skilled Nursing Facility Barriers to Discharge: SNF Pending bed offer  Expected Discharge Plan and Services Expected Discharge Plan: [place] arrangements for the past 2 months: Apartment                                     Social Determinants of Health (SDOH) Interventions  None requested or needed at this time.  Readmission Risk Interventions Readmission Risk Prevention Plan 09/17/2019  Transportation Screening Complete  PCP or Specialist Appt within 5-7 Days Not Complete  Not Complete comments snf vs home  Home Care Screening Complete  Medication Review (RN CM) Complete  Some recent data might be hidden

## 2019-12-14 NOTE — Progress Notes (Signed)
PROGRESS NOTE    Thomas [name]  [id number] DOB: [birth year] DOA: 11/24/2019 PCP: Antonietta Jewel, MD   Brief Narrative: Patient is a 69 year old male with history of CVA, hypertension, hyperlipidemia, gout, diabetes mellitus, peripheral artery disease, status post right BKA, chronic anemia was admitted on 1/31 [facility] after a fall.  He was found to have left lower extremity wound/cellulitis with concern for outpatient treatment fluid clindamycin.  He was treated with daptomycin and was planned to transition to linezolid at DC.  On evening of 2/3 he deveopled lower abdomen pain with nausea and vomiting with coffee-ground emesis .  FOBT was positive and he was transferred to [facility] for GI evaluation.  On 2/5, lumbar spinal MRI showed progressive osteomyelitis and persistent paraspinal cellulitis and abscess at that time as T10-11 with epidural abscess.  Underwent IR guided drainage.  Cultures negative.  ID was following.  Current plan is to continue daptomycin with prolonged p.o. antibiotics afterwards.  Currently waiting for placement.  SW closely following. Patient is medically stable for discharge to skilled facility soon as bed is available.  Assessment & Plan:   Principal Problem:   Thoracic discitis Active Problems:   [name], type 2) (HCC)   Gout   HTN (hypertension)   Pressure injury of skin   Upper GI bleeding   BPH (benign prostatic hyperplasia)   CKD (chronic kidney disease), stage IV (HCC)   Chronic pain syndrome   Physical deconditioning   Epidural abscess   Urinary retention   T10-T11 discitis with epidural abscess/MRSA bacteremia: Was previously hospitalized here from 11/11-12/20 for T10-T 11 discitis and MRSA bacteremia.  Daptomycin was transitioned to dalbavancin at time of DC home.  MRI on this admission showed evidence epidural abscess.  Neurosurgery, infectious disease were following.  Status post IR guided drainage of epidural  abscess.  Infectious disease recommends 8 weeks of IV daptomycin, till 01/20/2020.  Daptomycin looks expensive for skilled nursing facilities making the placement difficult.  We will talk to ID if there is any alternative.  Upper GI bleeding/hematemesis/anemia chronic disease: Hemoglobin stable between 8-9.  Status post upper GI endoscopy by Dr. Benson Norway.  No evidence of acute bleeding source.  Continue PPI.  Regular diet.  Chronic pain syndrome: Continue supportive care, pain management.  BPH/acute urinary retention: Continue tamsulosin, finasteride.  He had urinary retention during this hospitalization.  On Foley catheter.  Follow-up with urology on discharge.  Suspected urinary tract infection: Urine cultures showed multiple species.  Blood cultures are negative so far.  CKD stage IV: Presented with AKI because of volume depletion.  Improved with IV fluids. Held  Lasix.Cr in the range of 3 on baseline.creatinine creeped up today.  Started on gentle IV fluids.  He follows with nephrology at [facility].  Check BMP tomorrow  Hyperkalemia: Improved with Lokelma.  Hypertension: Above stable.  Continue current medications  Left heel/foot unstageable ulcer: Present on admission.  Continue wound care.  Status post right BKA.  Left ABI obtained on 2/4 indicated noncompressible left lower extremity arteries.  Can follow-up with vascular surgery outpatient.  History of gout: Continue allopurinol  Hypothyroidism: Continue levothyroxine  Pressure Injury 11/24/19 Foot Left Unstageable - Full thickness tissue loss in which the base of the injury is covered by slough (yellow, tan, gray, green or brown) and/or eschar (tan, brown or black) in the wound bed. this is a full thickness wound, not (Active)  11/24/19 0655  Location: Foot  Location Orientation: Left  Staging: Unstageable - Full thickness tissue loss in which the base of the injury is covered by slough (yellow, tan, gray, green or brown) and/or eschar  (tan, brown or black) in the wound bed.  Wound Description (Comments): this is a full thickness wound, not a pressure injury  Present on Admission: Yes     Pressure Injury 09/02/19 Foot Left Unstageable - Full thickness tissue loss in which the base of the ulcer is covered by slough (yellow, tan, gray, green or brown) and/or eschar (tan, brown or black) in the wound bed. (Active)  09/02/19 0815  Location: Foot  Location Orientation: Left  Staging: Unstageable - Full thickness tissue loss in which the base of the ulcer is covered by slough (yellow, tan, gray, green or brown) and/or eschar (tan, brown or black) in the wound bed.  Wound Description (Comments):   Present on Admission: Yes        Nutrition Problem: Increased nutrient needs Etiology: wound healing      DVT prophylaxis:Heparin [place] Code Status: Full Family Communication: None Disposition Plan: Patient is from home.  PT/OT recommended skilled nursing facility.  Currently waiting for placement.Hemodynamically  Stable for discharge soon as bed is available.  Social worker closely following.   Consultants: ID, nephrology, GI  Procedures: EGD  Antimicrobials:  Anti-infectives (From admission, onward)   Start     Dose/Rate Route Frequency Ordered Stop   12/13/19 2009  DAPTOmycin (CUBICIN) 1,000 mg in sodium chloride 0.9 % IVPB     1,000 mg 240 mL/hr over 30 Minutes Intravenous Every 48 hours 12/12/19 1408     12/06/19 2000  DAPTOmycin (CUBICIN) 1,000 mg in sodium chloride 0.9 % IVPB  Status:  Discontinued     1,000 mg 240 mL/hr over 30 Minutes Intravenous Daily 12/06/19 0807 12/12/19 1408   12/05/19 1000  DAPTOmycin (CUBICIN) 1,000 mg in sodium chloride 0.9 % IVPB  Status:  Discontinued     1,000 mg 240 mL/hr over 30 Minutes Intravenous Every other day 12/04/19 1456 12/06/19 0807   11/26/19 1730  DAPTOmycin (CUBICIN) 1,000 mg in sodium chloride 0.9 % IVPB  Status:  Discontinued     1,000 mg 240 mL/hr over 30 Minutes  Intravenous Every 24 hours 11/26/19 1642 12/04/19 1456      Subjective:  Patient seen and examined at the bedside this morning.  Denies some abdominal discomfort but otherwise feeling fine.  He was sleeping when I entered the room.  Foley catheter will be taken out today.  Started on gentle IV fluids because his creatinine went up.  Objective: Vitals:   12/13/19 0926 12/13/19 1752 12/13/19 2054 12/14/19 0521  BP: 129/66 125/67 117/80 130/77  Pulse: (!) 54 67 62 61  Resp: 18 18 18 18   Temp: 98 F (36.7 C) 98.3 F (36.8 C) 98 F (36.7 C) 98 F (36.7 C)  TempSrc: Oral Oral  Oral  SpO2: 100% 97% 97% 98%  Weight:      Height:        Intake/Output Summary (Last 24 hours) at 12/14/2019 0744 Last data filed at 12/14/2019 0600 Gross per 24 hour  Intake 880 ml  Output 3100 ml  Net -2220 ml   Filed Weights   12/04/19 2107 12/07/19 2037 12/12/19 2107  Weight: 111.2 kg 112 kg 113 kg    Examination:   General exam: Comfortable Respiratory system: Bilateral equal air entry, normal vesicular breath sounds, no wheezes or crackles  Cardiovascular system: S1 & S2 heard, RRR. No JVD,  murmurs, rubs, gallops or clicks. Gastrointestinal system: Abdomen is nondistended, soft and nontender. No organomegaly or masses felt. Normal bowel sounds heard. Central nervous system: Alert and oriented. No focal neurological deficits. Extremities: No edema, no clubbing ,no cyanosis, right AKA, left lower extremity wrapped with dressings       Data Reviewed: I have personally reviewed following labs and imaging studies  CBC: Recent Labs  Lab 12/10/19 0401  WBC 5.9  NEUTROABS 3.2  HGB 9.6*  HCT 29.2*  MCV 95.4  PLT 0000000   Basic Metabolic Panel: Recent Labs  Lab 12/08/19 0356 12/10/19 0401 12/12/19 0512 12/13/19 0355 12/14/19 0515  NA 134* 137 136 136 136  K 5.2* 5.1 4.5 4.9 5.0  CL 101 100 100 101 99  CO2 22 25 23 27 26   GLUCOSE 143* 157* 162* 151* 193*  BUN 41* 49* 49* 48* 51*    CREATININE 2.86* 3.42* 3.77* 3.53* 3.70*  CALCIUM 9.2 9.2 9.0 9.1 9.1  PHOS 5.7*  --   --   --   --    GFR: Estimated Creatinine Clearance: 27.8 mL/min (A) (by C-G formula based on SCr of 3.7 mg/dL (H)). Liver Function Tests: Recent Labs  Lab 12/08/19 0356  ALBUMIN 2.8*   No results for input(s): LIPASE, AMYLASE in the last 168 hours. No results for input(s): AMMONIA in the last 168 hours. Coagulation Profile: No results for input(s): INR, PROTIME in the last 168 hours. Cardiac Enzymes: Recent Labs  Lab 12/12/19 0512  CKTOTAL 54   BNP (last 3 results) No results for input(s): PROBNP in the last 8760 hours. HbA1C: No results for input(s): HGBA1C in the last 72 hours. CBG: Recent Labs  Lab 12/13/19 0651 12/13/19 1121 12/13/19 1635 12/13/19 2053 12/14/19 0656  GLUCAP 150* 162* 179* 154* 175*   Lipid Profile: No results for input(s): CHOL, HDL, LDLCALC, TRIG, CHOLHDL, LDLDIRECT in the last 72 hours. Thyroid Function Tests: No results for input(s): TSH, T4TOTAL, FREET4, T3FREE, THYROIDAB in the last 72 hours. Anemia Panel: No results for input(s): VITAMINB12, FOLATE, FERRITIN, TIBC, IRON, RETICCTPCT in the last 72 hours. Sepsis Labs: No results for input(s): PROCALCITON, LATICACIDVEN in the last 168 hours.  Recent Results (from the past 240 hour(s))  Culture, blood (routine x 2)     Status: None   Collection Time: 12/04/19 11:16 AM   Specimen: BLOOD  Result Value Ref Range Status   Specimen Description BLOOD RIGHT ANTECUBITAL  Final   Special Requests   Final    BOTTLES DRAWN AEROBIC AND ANAEROBIC Blood Culture adequate volume   Culture   Final    NO GROWTH 5 DAYS Performed at [facility], [address], [place], [place] [address]    Report Status 12/09/2019 FINAL  Final  Culture, blood (routine x 2)     Status: None   Collection Time: 12/04/19 11:39 AM   Specimen: BLOOD  Result Value Ref Range Status   Specimen Description BLOOD LEFT ANTECUBITAL   Final   Special Requests   Final    BOTTLES DRAWN AEROBIC AND ANAEROBIC Blood Culture adequate volume   Culture   Final    NO GROWTH 5 DAYS Performed at [facility], [place] [address], [place], [place] [address]    Report Status 12/09/2019 FINAL  Final         Radiology Studies: No results found.      Scheduled Meds: . allopurinol  100 mg Oral BID  . amLODipine  10  mg Oral Daily  . Chlorhexidine Gluconate Cloth  6 each Topical Daily  . cloNIDine  0.3 mg Oral TID  . collagenase   Topical Daily  . feeding supplement (ENSURE ENLIVE)  237 mL Oral BID BM  . finasteride  5 mg Oral Daily  . heparin injection (subcutaneous)  5,000 Units Subcutaneous Q8H  . hydrALAZINE  50 mg Oral Q8H  . insulin aspart  0-15 Units Subcutaneous TID WC  . insulin aspart  0-5 Units Subcutaneous QHS  . levothyroxine  50 mcg Oral Q0600  . lidocaine  2 patch Transdermal Q24H  . lidocaine-prilocaine   Topical Daily  . metoprolol tartrate  50 mg Oral BID  . nutrition supplement (JUVEN)  1 packet Oral BID BM  . oxyCODONE  30 mg Oral TID  . pantoprazole  40 mg Oral Daily  . sodium chloride flush  3 mL Intravenous Q12H  . sodium zirconium cyclosilicate  10 g Oral Daily  . tamsulosin  0.4 mg Oral Daily   Continuous Infusions: . sodium chloride Stopped (11/27/19 2024)  . sodium chloride    . DAPTOmycin (CUBICIN)  IV 1,000 mg (12/13/19 2201)     LOS: 20 days    Time spent:15 mins. More than 50% of that time was spent in counseling and/or coordination of care.      Shelly Coss, MD [facility] P2/24/2021, 7:44 AM

## 2019-12-14 NOTE — Progress Notes (Signed)
Dressing change done on patients left foot. Patient tolerated well.   Farley Ly RN

## 2019-12-14 NOTE — Plan of Care (Signed)
  Problem: Clinical Measurements: Goal: Ability to maintain clinical measurements within normal limits will improve Outcome: Progressing   

## 2019-12-14 NOTE — Progress Notes (Signed)
Foley removed by me [employer]).  Patient tolerated well.   Farley Ly RN

## 2019-12-15 LAB — BASIC METABOLIC PANEL
Anion gap: 11 (ref 5–15)
BUN: 53 mg/dL — ABNORMAL HIGH (ref 8–23)
CO2: 25 mmol/L (ref 22–32)
Calcium: 9.1 mg/dL (ref 8.9–10.3)
Chloride: 100 mmol/L (ref 98–111)
Creatinine, Ser: 3.71 mg/dL — ABNORMAL HIGH (ref 0.61–1.24)
GFR calc Af Amer: 19 mL/min — ABNORMAL LOW (ref >60)
GFR calc non Af Amer: 16 mL/min — ABNORMAL LOW (ref >60)
Glucose, Bld: 185 mg/dL — ABNORMAL HIGH (ref 70–99)
Potassium: 4.7 mmol/L (ref 3.5–5.1)
Sodium: 136 mmol/L (ref 135–145)

## 2019-12-15 LAB — GLUCOSE, CAPILLARY
Glucose-Capillary: 173 mg/dL — ABNORMAL HIGH (ref 70–99)
Glucose-Capillary: 190 mg/dL — ABNORMAL HIGH (ref 70–99)
Glucose-Capillary: 191 mg/dL — ABNORMAL HIGH (ref 70–99)
Glucose-Capillary: 176 mg/dL — ABNORMAL HIGH (ref 70–99)

## 2019-12-15 MED ORDER — SENNA 8.6 MG PO TABS
2.0000 | ORAL_TABLET | Freq: Every day | ORAL | Status: DC
  Administered 2019-12-15 – 2019-12-16 (×2): 17.2 mg via ORAL
  Filled 2019-12-15 (×2): qty 2

## 2019-12-15 NOTE — Progress Notes (Signed)
PROGRESS NOTE    Thomas [name]  [id number] DOB: 06/17/1951 DOA: 11/24/2019 PCP: Antonietta Jewel, MD   Brief Narrative: Patient is a 69 year old male with history of CVA, hypertension, hyperlipidemia, gout, diabetes mellitus, peripheral artery disease, status post right BKA, chronic anemia was admitted on 1/31 [facility] after a fall.  He was found to have left lower extremity wound/cellulitis with concern for outpatient treatment fluid clindamycin.  He was treated with daptomycin and was planned to transition to linezolid at DC.  On evening of 2/3 he deveopled lower abdomen pain with nausea and vomiting with coffee-ground emesis .  FOBT was positive and he was transferred to [facility] for GI evaluation.  On 2/5, lumbar spinal MRI showed progressive osteomyelitis and persistent paraspinal cellulitis and abscess at that time as T10-11 with epidural abscess.  Underwent IR guided drainage.  Cultures negative.  ID was following.  Current plan is to continue daptomycin with prolonged p.o. antibiotics afterwards.  Currently waiting for placement.  SW closely following. Patient is medically stable for discharge to skilled facility soon as bed is available.  Assessment & Plan:   Principal Problem:   Thoracic discitis Active Problems:   [name], type 2) (HCC)   Gout   HTN (hypertension)   Pressure injury of skin   Upper GI bleeding   BPH (benign prostatic hyperplasia)   CKD (chronic kidney disease), stage IV (HCC)   Chronic pain syndrome   Physical deconditioning   Epidural abscess   Urinary retention   T10-T11 discitis with epidural abscess/MRSA bacteremia: Was previously hospitalized here from 11/11-12/20 for T10-T 11 discitis and MRSA bacteremia.  Daptomycin was transitioned to dalbavancin at time of DC home.  MRI on this admission showed evidence epidural abscess.  Neurosurgery, infectious disease were following.  Status post IR guided drainage of epidural  abscess.  Infectious disease recommends 8 weeks of IV daptomycin, till 01/20/2020.  Daptomycin looks expensive for skilled nursing facilities making the placement difficult.  I have talked to ID for possibilities of  any alternative.  Upper GI bleeding/hematemesis/anemia chronic disease: Hemoglobin stable between 8-9.  Status post upper GI endoscopy by Dr. Benson Norway.  No evidence of acute bleeding source.  Continue PPI.  Regular diet.  Chronic pain syndrome: Continue supportive care, pain management.  BPH/acute urinary retention: Continue tamsulosin, finasteride.  He had urinary retention during this hospitalization.  On Foley catheter which has been removed.Will monitor for retention.  Follow-up with urology on discharge.  Suspected urinary tract infection: Urine cultures showed multiple species.  Blood cultures are negative so far.  CKD stage IV: Presented with AKI because of volume depletion.  Improved with IV fluids. Held  Lasix.Cr in the range of 3 on baseline.creatinine creeped up ,started on gentle IV fluids.  He follows with nephrology at [facility].  Check BMP tomorrow  Hyperkalemia: Improved with Lokelma.  Hypertension: Above stable.  Continue current medications  Left heel/foot unstageable ulcer: Present on admission.  Continue wound care.  Status post right BKA.  Left ABI obtained on 2/4 indicated noncompressible left lower extremity arteries.  Can follow-up with vascular surgery outpatient.  History of gout: Continue allopurinol  Hypothyroidism: Continue levothyroxine  Pressure Injury 11/24/19 Foot Left Unstageable - Full thickness tissue loss in which the base of the injury is covered by slough (yellow, tan, gray, green or brown) and/or eschar (tan, brown or black) in the wound bed. this is a full thickness wound, not (Active)  11/24/19 [id number]  Location:  Foot  Location Orientation: Left  Staging: Unstageable - Full thickness tissue loss in which the base of the injury is covered by  slough (yellow, tan, gray, green or brown) and/or eschar (tan, brown or black) in the wound bed.  Wound Description (Comments): this is a full thickness wound, not a pressure injury  Present on Admission: Yes     Pressure Injury 09/02/19 Foot Left Unstageable - Full thickness tissue loss in which the base of the ulcer is covered by slough (yellow, tan, gray, green or brown) and/or eschar (tan, brown or black) in the wound bed. (Active)  09/02/19 0815  Location: Foot  Location Orientation: Left  Staging: Unstageable - Full thickness tissue loss in which the base of the ulcer is covered by slough (yellow, tan, gray, green or brown) and/or eschar (tan, brown or black) in the wound bed.  Wound Description (Comments):   Present on Admission: Yes        Nutrition Problem: Increased nutrient needs Etiology: wound healing      DVT prophylaxis:Heparin [place] Code Status: Full Family Communication: None Disposition Plan: Patient is from home.  PT/OT recommended skilled nursing facility.  Currently waiting for placement.Hemodynamically  Stable for discharge soon as bed is available.  Social worker closely following.   Consultants: ID, nephrology, GI  Procedures: EGD  Antimicrobials:  Anti-infectives (From admission, onward)   Start     Dose/Rate Route Frequency Ordered Stop   12/13/19 2009  DAPTOmycin (CUBICIN) 1,000 mg in sodium chloride 0.9 % IVPB     1,000 mg 240 mL/hr over 30 Minutes Intravenous Every 48 hours 12/12/19 1408     12/06/19 2000  DAPTOmycin (CUBICIN) 1,000 mg in sodium chloride 0.9 % IVPB  Status:  Discontinued     1,000 mg 240 mL/hr over 30 Minutes Intravenous Daily 12/06/19 0807 12/12/19 1408   12/05/19 1000  DAPTOmycin (CUBICIN) 1,000 mg in sodium chloride 0.9 % IVPB  Status:  Discontinued     1,000 mg 240 mL/hr over 30 Minutes Intravenous Every other day 12/04/19 1456 12/06/19 0807   11/26/19 1730  DAPTOmycin (CUBICIN) 1,000 mg in sodium chloride 0.9 % IVPB  Status:   Discontinued     1,000 mg 240 mL/hr over 30 Minutes Intravenous Every 24 hours 11/26/19 1642 12/04/19 1456      Subjective:  Patient seen and examined at the bedside this morning.  Hemodynamically stable.  We remove the Foley catheter yesterday.  He is voiding freely without any problem.  He is complaining of straining while having bowel movements.  Added Senokot  Objective: Vitals:   12/14/19 0915 12/14/19 1700 12/14/19 2108 12/15/19 0513  BP: 120/68 124/75 139/72 (!) 173/86  Pulse: 64 66 70 77  Resp: 18 18 18 19   Temp: 98.3 F (36.8 C) 98.4 F (36.9 C) 98.1 F (36.7 C) 98 F (36.7 C)  TempSrc: Oral Oral Oral Oral  SpO2: 98% 98% 97% 99%  Weight:   113 kg   Height:        Intake/Output Summary (Last 24 hours) at 12/15/2019 0809 Last data filed at 12/15/2019 0600 Gross per 24 hour  Intake 2335.43 ml  Output 950 ml  Net 1385.43 ml   Filed Weights   12/07/19 2037 12/12/19 2107 12/14/19 2108  Weight: 112 kg 113 kg 113 kg    Examination:  General exam: Comfortable ,not in distress Respiratory system: Bilateral equal air entry, normal vesicular breath sounds, no wheezes or crackles  Cardiovascular system: S1 & S2 heard,  RRR. No JVD, murmurs, rubs, gallops or clicks. Gastrointestinal system: Abdomen is nondistended, soft and nontender. No organomegaly or masses felt. Normal bowel sounds heard. Central nervous system: Alert and oriented. No focal neurological deficits. Extremities: No edema, no clubbing ,no cyanosis, right AKA, left lower extremity wrapped with dressings   Data Reviewed: I have personally reviewed following labs and imaging studies  CBC: Recent Labs  Lab 12/10/19 0401  WBC 5.9  NEUTROABS 3.2  HGB 9.6*  HCT 29.2*  MCV 95.4  PLT 0000000   Basic Metabolic Panel: Recent Labs  Lab 12/10/19 0401 12/12/19 0512 12/13/19 0355 12/14/19 0515 12/15/19 0350  NA 137 136 136 136 136  K 5.1 4.5 4.9 5.0 4.7  CL 100 100 101 99 100  CO2 25 23 27 26 25     GLUCOSE 157* 162* 151* 193* 185*  BUN 49* 49* 48* 51* 53*  CREATININE 3.42* 3.77* 3.53* 3.70* 3.71*  CALCIUM 9.2 9.0 9.1 9.1 9.1   GFR: Estimated Creatinine Clearance: 27.7 mL/min (A) (by C-G formula based on SCr of 3.71 mg/dL (H)). Liver Function Tests: No results for input(s): AST, ALT, ALKPHOS, BILITOT, PROT, ALBUMIN in the last 168 hours. No results for input(s): LIPASE, AMYLASE in the last 168 hours. No results for input(s): AMMONIA in the last 168 hours. Coagulation Profile: No results for input(s): INR, PROTIME in the last 168 hours. Cardiac Enzymes: Recent Labs  Lab 12/12/19 0512  CKTOTAL 54   BNP (last 3 results) No results for input(s): PROBNP in the last 8760 hours. HbA1C: No results for input(s): HGBA1C in the last 72 hours. CBG: Recent Labs  Lab 12/14/19 0656 12/14/19 1144 12/14/19 1627 12/14/19 2059 12/15/19 0651  GLUCAP 175* 172* 173* 185* 173*   Lipid Profile: No results for input(s): CHOL, HDL, LDLCALC, TRIG, CHOLHDL, LDLDIRECT in the last 72 hours. Thyroid Function Tests: No results for input(s): TSH, T4TOTAL, FREET4, T3FREE, THYROIDAB in the last 72 hours. Anemia Panel: No results for input(s): VITAMINB12, FOLATE, FERRITIN, TIBC, IRON, RETICCTPCT in the last 72 hours. Sepsis Labs: No results for input(s): PROCALCITON, LATICACIDVEN in the last 168 hours.  No results found for this or any previous visit (from the past 240 hour(s)).       Radiology Studies: No results found.      Scheduled Meds: . allopurinol  100 mg Oral BID  . amLODipine  10 mg Oral Daily  . Chlorhexidine Gluconate Cloth  6 each Topical Daily  . cloNIDine  0.3 mg Oral TID  . collagenase   Topical Daily  . feeding supplement (ENSURE ENLIVE)  237 mL Oral BID BM  . finasteride  5 mg Oral Daily  . heparin injection (subcutaneous)  5,000 Units Subcutaneous Q8H  . hydrALAZINE  50 mg Oral Q8H  . insulin aspart  0-15 Units Subcutaneous TID WC  . insulin aspart  0-5 Units  Subcutaneous QHS  . levothyroxine  50 mcg Oral Q0600  . lidocaine  2 patch Transdermal Q24H  . lidocaine-prilocaine   Topical Daily  . metoprolol tartrate  50 mg Oral BID  . nutrition supplement (JUVEN)  1 packet Oral BID BM  . oxyCODONE  30 mg Oral TID  . pantoprazole  40 mg Oral Daily  . polyethylene glycol  17 g Oral BID  . sodium chloride flush  3 mL Intravenous Q12H  . sodium zirconium cyclosilicate  10 g Oral Daily  . tamsulosin  0.4 mg Oral Daily   Continuous Infusions: . sodium chloride Stopped (11/27/19 2024)  .  sodium chloride 75 mL/hr at 12/15/19 0555  . DAPTOmycin (CUBICIN)  IV 1,000 mg (12/13/19 2201)     LOS: 21 days    Time spent:15 mins. More than 50% of that time was spent in counseling and/or coordination of care.      Shelly Coss, MD [facility] P2/25/2021, 8:09 AM

## 2019-12-15 NOTE — TOC Progression Note (Signed)
Transition of Care [facility]) - Progression Note    Patient Details  Name: Thomas [name] MRN: [id number] Date of Birth: 12-05-1950  [facility]) CM/SW Contact  Sharlet Salina Mila Homer, LCSW Phone Number: 12/15/2019, 4:26 PM  Clinical Narrative: Made contact with [facility] and [facility] admissions directors regarding abx change to ceftaroline, to determine if they can accept patient. Provided length of time that patient will be this abx, and awaiting response from facilities.      Expected Discharge Plan: [place] Barriers to Discharge: SNF Pending bed offer  Expected Discharge Plan and Services Expected Discharge Plan: [place] arrangements for the past 2 months: Apartment                                     Social Determinants of Health (SDOH) Interventions    Readmission Risk Interventions Readmission Risk Prevention Plan 09/17/2019  Transportation Screening Complete  PCP or Specialist Appt within 5-7 Days Not Complete  Not Complete comments snf vs home  Home Care Screening Complete  Medication Review (RN CM) Complete  Some recent data might be hidden

## 2019-12-15 NOTE — Plan of Care (Signed)
  Problem: Clinical Measurements: Goal: Diagnostic test results will improve Outcome: Completed/Met Goal: Cardiovascular complication will be avoided Outcome: Completed/Met   Problem: Activity: Goal: Risk for activity intolerance will decrease Outcome: Completed/Met   Problem: Coping: Goal: Level of anxiety will decrease Outcome: Completed/Met   Problem: Skin Integrity: Goal: Risk for impaired skin integrity will decrease Outcome: Completed/Met

## 2019-12-16 LAB — GLUCOSE, CAPILLARY
Glucose-Capillary: 147 mg/dL — ABNORMAL HIGH (ref 70–99)
Glucose-Capillary: 147 mg/dL — ABNORMAL HIGH (ref 70–99)
Glucose-Capillary: 174 mg/dL — ABNORMAL HIGH (ref 70–99)

## 2019-12-16 LAB — BASIC METABOLIC PANEL
Anion gap: 9 (ref 5–15)
BUN: 46 mg/dL — ABNORMAL HIGH (ref 8–23)
CO2: 25 mmol/L (ref 22–32)
Calcium: 8.9 mg/dL (ref 8.9–10.3)
Chloride: 103 mmol/L (ref 98–111)
Creatinine, Ser: 3.19 mg/dL — ABNORMAL HIGH (ref 0.61–1.24)
GFR calc Af Amer: 22 mL/min — ABNORMAL LOW (ref >60)
GFR calc non Af Amer: 19 mL/min — ABNORMAL LOW (ref >60)
Glucose, Bld: 223 mg/dL — ABNORMAL HIGH (ref 70–99)
Potassium: 4.4 mmol/L (ref 3.5–5.1)
Sodium: 137 mmol/L (ref 135–145)

## 2019-12-16 LAB — SARS CORONAVIRUS 2 (TAT 6-24 HRS): SARS Coronavirus 2: NEGATIVE

## 2019-12-16 MED ORDER — SODIUM ZIRCONIUM CYCLOSILICATE 10 G PO PACK: 10.0000 g | PACK | Freq: Every day | ORAL | Status: DC

## 2019-12-16 MED ORDER — LIDOCAINE 5 % EX PTCH: 2.0000 | MEDICATED_PATCH | CUTANEOUS | 0 refills | Status: DC

## 2019-12-16 MED ORDER — CEFTAROLINE IV (FOR PTA / DISCHARGE USE ONLY): 300.0000 mg | Freq: Two times a day (BID) | INTRAVENOUS | 0 refills | Status: AC

## 2019-12-16 MED ORDER — OXYCONTIN 30 MG PO T12A: 1.0000 | EXTENDED_RELEASE_TABLET | Freq: Three times a day (TID) | ORAL | 0 refills | Status: DC

## 2019-12-16 MED ORDER — PANTOPRAZOLE SODIUM 40 MG PO TBEC: 40.0000 mg | DELAYED_RELEASE_TABLET | Freq: Every day | ORAL | Status: DC

## 2019-12-16 MED ORDER — SODIUM CHLORIDE 0.9 % IV SOLN
300.0000 mg | Freq: Two times a day (BID) | INTRAVENOUS | Status: DC
  Administered 2019-12-16 – 2019-12-17 (×3): 300 mg via INTRAVENOUS
  Filled 2019-12-16 (×4): qty 300

## 2019-12-16 MED ORDER — POLYETHYLENE GLYCOL 3350 17 G PO PACK: 17.0000 g | PACK | Freq: Every day | ORAL | 0 refills | Status: DC

## 2019-12-16 NOTE — TOC Progression Note (Signed)
Transition of Care (TOC) - Progression Note  *D/C to [place] Saturday, 227.   Patient Details  Name: Thomas [name] MRN: [id number] Date of Birth: 1950/11/15  Transition of Care [facility]) CM/SW Contact  Sharlet Salina Mila Homer, LCSW Phone Number: 12/16/2019, 5:27 PM  Clinical Narrative: Received replies from [facility] regarding patient. Raquel Sarna with [facility] responded that the new IV abx costs as much or more than the daptomycin and they are unable to accept patient. Margarita Grizzle with [facility] responded that they can take patient with the alternate IV abx and can take him tomorrow. Margarita Grizzle requested that [place], Engineer, site at [facility] be contacted - [phone].   Advised patient that none of the there facilities he requested can accept him. CSW contacted (1:13 pm) [facility] in [place] and spoke with Jenny Reichmann in admissions and they won't have any rehab bed until Monday, and patient advised. After talking with Md, patient agreeable to [facility] and followed up with Margarita Grizzle, and they can take patient Saturday. Talked with MD regarding Saturday discharge and he also placed COVID test order.      Expected Discharge Plan: [place] Barriers to Discharge: SNF Pending bed offer  Expected Discharge Plan and Services Expected Discharge Plan: [place] arrangements for the past 2 months: Apartment Expected Discharge Date: 12/16/19                                   Social Determinants of Health (SDOH) Interventions  None needed or requested at this time  Readmission Risk Interventions Readmission Risk Prevention Plan 09/17/2019  Transportation Screening Complete  PCP or Specialist Appt within 5-7 Days Not Complete  Not Complete comments snf vs home  Home Care Screening Complete  Medication Review (RN CM) Complete  Some recent data might be hidden

## 2019-12-16 NOTE — Progress Notes (Signed)
PHARMACY CONSULT NOTE FOR:  OUTPATIENT  PARENTERAL ANTIBIOTIC THERAPY (OPAT)  Indication: Discitis/Epidural Abscess Regimen: Ceftaroline 300 mg Q 12 hours End date: 01/20/2020  Discussed with Drs. Tommy Medal and [place] and will switch patient to ceftaroline to allow discharge to SNF.   IV antibiotic discharge orders are pended. To discharging provider:  please sign these orders via discharge navigator,  Select New Orders & click on the button choice - Manage This Unsigned Work.     Thank you for allowing pharmacy to be a part of this patient's care.  Jimmy Footman, PharmD, BCPS, BCIDP Infectious Diseases Clinical Pharmacist Phone: [phone] 12/16/2019, 1:45 PM

## 2019-12-16 NOTE — Progress Notes (Addendum)
PT Cancellation Note  Patient Details Name: Thomas [name] MRN: [id number] DOB: 03/30/1951   Cancelled Treatment:    Reason Eval/Treat Not Completed: Other (comment) Attempted co-treat with OT, however pt refusing treatment secondary to pain. He is requesting therapy hold off until he received new meds and a possible xray later today. Made second attempt this PM and pt continues to refuse despite encouragement and education.  Will check back as time allows.   Benjiman Core, PTA Pager [phone] Acute Rehab   Allena Katz 12/16/2019, 12:31 PM

## 2019-12-16 NOTE — Plan of Care (Signed)
  Problem: Clinical Measurements: Goal: Ability to maintain clinical measurements within normal limits will improve Outcome: Progressing   

## 2019-12-16 NOTE — Progress Notes (Signed)
OT Cancellation Note  Patient Details Name: Thomas [name] MRN: [id number] DOB: [birth year]   Cancelled Treatment:    Reason Eval/Treat Not Completed: Pain limiting ability to participate;Other (comment)attempted skilled OT treatment session x2 today.  1st attempt pt. Was awaiting pain meds and requested I try back.  2nd. Attempt pt. Reports he is still in pain and is awaiting more/different meds and possible dx. Xray for abdominal pains.    Daiva Huge Lorraine-COTA/L 12/16/2019, 11:53 AM

## 2019-12-16 NOTE — Discharge Summary (Addendum)
Physician Discharge Summary  Thomas [name] YQM:[id number] DOB: 1951/01/19 DOA: 11/24/2019  PCP: Antonietta Jewel, MD  Admit date: 11/24/2019 Discharge date: 12/17/19  Admitted From: Home Disposition: SNF  Discharge Condition:Stable CODE STATUS:FULL Diet recommendation: Heart Healthy  Brief/Interim Summary: Patient is a 69 year old male with history of CVA, hypertension, hyperlipidemia, gout, diabetes mellitus, peripheral artery disease, status post right BKA, chronic anemia was admitted on 1/31 [facility] after a fall.  He was found to have left lower extremity wound/cellulitis with concern for outpatient treatment fluid clindamycin.  He was treated with daptomycin and was planned to transition to linezolid at DC.  On evening of 2/3 he deveopled lower abdomen pain with nausea and vomiting with coffee-ground emesis .  FOBT was positive and he was transferred to [facility] for GI evaluation.  On 2/5, lumbar spinal MRI showed progressive osteomyelitis and persistent paraspinal cellulitis and abscess at that time as T10-11 with epidural abscess.  Underwent IR guided drainage.  Cultures negative.  ID was following.  Current plan is to continue ceftaroline  with prolonged p.o. antibiotics afterwards.  He will follow-up with infectious disease as an outpatient. Patient is medically stable for discharge to skilled nursing facility.  Following problems were addressed during his hospitalization:  T10-T11 discitis with epidural abscess/MRSA bacteremia: Was previously hospitalized here from 11/11-12/20 for T10-T 11 discitis and MRSA bacteremia.  Daptomycin was transitioned to dalbavancin at time of DC home.  MRI on this admission showed evidence epidural abscess.  Neurosurgery, infectious disease were following.  Status post IR guided drainage of epidural abscess.    He was initially started on daptomycin which has to be changed to ceftaroline on discharge because skilled nursing facilities not accept  daptomycin.  Infectious disease recommends 8 weeks of ceftaroline till 01/20/2020.    Plan is to change to oral antibiotics after follow-up appointment with ID.  Upper GI bleeding/hematemesis/anemia chronic disease: Hemoglobin stable between 8-9.  Status post upper GI endoscopy by Dr. Benson Norway.  No evidence of acute bleeding source. Continue PPI.  Regular diet.  Chronic pain syndrome: Continue supportive care, pain management.  Has chronic back pain.  On high-dose oxycodone.  BPH/acute urinary retention: Continue tamsulosin, finasteride.  He had urinary retention during this hospitalization.  On Foley catheter which has been removed.  Follow-up with urology as an outpatient.  Currently he is voiding without any problem  Suspected urinary tract infection: Urine cultures showed multiple species.  Blood cultures are negative so far.  CKD stage IV: Presented with AKI because of volume depletion.  Improved with IV fluids. Cr in the range of 3 on baseline.  He follows with nephrology at [facility].  Check BMP in a week.  Hyperkalemia: Improved with Lokelma.  Continue Lokelma.  Hypertension: Above stable.  Continue current medications  Left heel/foot unstageable ulcer: Present on admission.  Continue wound care.  Status post right BKA.  Left ABI obtained on 2/4 indicated noncompressible left lower extremity arteries.  He needs  follow-up with vascular surgery as an outpatient.  History of gout: Continue allopurinol  Hypothyroidism: Continue levothyroxine   Discharge Diagnoses:  Principal Problem:   Thoracic discitis Active Problems:   [name], type 2) (HCC)   Gout   HTN (hypertension)   Pressure injury of skin   Upper GI bleeding   BPH (benign prostatic hyperplasia)   CKD (chronic kidney disease), stage IV (HCC)   Chronic pain syndrome   Physical deconditioning   Epidural abscess   Urinary  retention    Discharge Instructions  Discharge Instructions    Diet  - low sodium heart healthy   Complete by: As directed    Discharge instructions   Complete by: As directed    1)Please do a BMP test in a week. 2)Take prescribed medication as instructed. 3)Follow up with infectious disease on 12/27/2019 at 9 AM.  Appointment has been scheduled 4)Follow up with your nephrologist in 2 weeks.   Home infusion instructions   Complete by: As directed    Instructions: Flushing of vascular access device: 0.9% NaCl pre/post medication administration and prn patency; Heparin 100 u/ml, 19m for implanted ports and Heparin 10u/ml, 565mfor all other central venous catheters.   Increase activity slowly   Complete by: As directed      Allergies as of 12/16/2019      Reactions   Other Other (See Comments)   [facility] Pt states he can only take these pain meds or else he gets very sick: Oxycontin,morphine,demerol, and dilaudid are the only pain meds pt states he can take.    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 2.4.2021: He also reports that he currently take Oxycontin.   Oxymorphone Other (See Comments)   Causes kidney problems   Aspirin Nausea And Vomiting   [facility] Beta Vulgaris Nausea And Vomiting   Beets   Buspirone Other (See Comments)   unknown   Cabbage Nausea And Vomiting   Codeine Other (See Comments)   unknown   Fish Allergy Nausea And Vomiting   Fish-derived Products Nausea And Vomiting   Pentazocine Other (See Comments)   unknown   Propoxyphene Other (See Comments)   unknown   Shellfish Allergy Nausea And Vomiting   Sulfa Antibiotics Hives   Sulfasalazine Other (See Comments)   Amoxicillin Rash   [facility] Apap [acetaminophen] Rash      Medication List    STOP taking these medications   Dalvance 500 MG Solr Generic drug: dalbavancin   nutrition supplement (JUVEN) Pack   tiZANidine 2 MG  tablet Commonly known as: ZANAFLEX     TAKE these medications   allopurinol 100 MG tablet Commonly known as: ZYLOPRIM Take 100 mg by mouth 2 (two) times daily.   amLODipine 10 MG tablet Commonly known as: NORVASC Take 1 tablet by mouth daily as needed (high blood pressure).   ceftaroline  IVPB Commonly known as: TEFLARO Inject 300 mg into the vein every 12 (twelve) hours. Indication:  Discitis/Epidural abscess Last Day of Therapy:  01/20/2020 Labs - Once weekly:  CBC/D and BMP, Labs - Every other week:  ESR and CRP   cloNIDine 0.3 MG tablet Commonly known as: CATAPRES Take 0.3 mg by mouth 3 (three) times daily.   ferrous sulfate 325 (65 FE) MG tablet Take 325 mg by mouth 2 (two) times daily.   finasteride 5 MG tablet Commonly known as: PROSCAR Take 1 tablet (5 mg total) by mouth daily.   glipiZIDE 10 MG 24 hr tablet Commonly known as: GLUCOTROL XL Take 10 mg by mouth 2 (two) times daily.   hydrALAZINE 50 MG tablet Commonly known as: APRESOLINE Take 1 tablet (50 mg total) by mouth every 8 (eight) hours. What changed:   when to take this  reasons to take this   lidocaine 5 % Commonly known as: LIDODERM Place 2 patches onto the skin daily. Remove &  Discard patch within 12 hours or as directed by MD   metoprolol tartrate 50 MG tablet Commonly known as: LOPRESSOR Take 1 tablet (50 mg total) by mouth 2 (two) times daily. What changed:   when to take this  reasons to take this   multivitamin with minerals Tabs tablet Take 1 tablet by mouth daily.   Neurontin 300 MG capsule Generic drug: gabapentin Take 300 mg by mouth 3 (three) times daily as needed (nerve pain).   OxyCONTIN 30 MG 12 hr tablet Generic drug: oxyCODONE Take 1 tablet (30 mg total) by mouth 3 (three) times daily. What changed: Another medication with the same name was removed. Continue taking this medication, and follow the directions you see here.   pantoprazole 40 MG tablet Commonly known  as: PROTONIX Take 1 tablet (40 mg total) by mouth daily. Start taking on: December 17, 2019   PEGLOTICASE IV Inject 1 Dose into the vein every 14 (fourteen) days.   polyethylene glycol 17 g packet Commonly known as: MIRALAX / GLYCOLAX Take 17 g by mouth daily. What changed:   when to take this  reasons to take this   senna-docusate 8.6-50 MG tablet Commonly known as: Senokot-S Take 1 tablet by mouth 2 (two) times daily.   sodium zirconium cyclosilicate 10 g Pack packet Commonly known as: LOKELMA Take 10 g by mouth daily. Start taking on: December 17, 2019   Synthroid 50 MCG tablet Generic drug: levothyroxine Take 50 mcg by mouth daily before breakfast.   tamsulosin 0.4 MG Caps capsule Commonly known as: FLOMAX Take 0.4 mg by mouth daily.   Vitamin B-12 5000 MCG Tbdp Take 2,500 mcg by mouth daily.   Vitamin D (Ergocalciferol) 1.25 MG (50000 UNIT) Caps capsule Commonly known as: DRISDOL Take 1 capsule by mouth every Saturday.            Home Infusion Instuctions  (From admission, onward)         Start     Ordered   12/16/19 0000  Home infusion instructions    Question:  Instructions  Answer:  Flushing of vascular access device: 0.9% NaCl pre/post medication administration and prn patency; Heparin 100 u/ml, 54m for implanted ports and Heparin 10u/ml, 548mfor all other central venous catheters.   12/16/19 1404         Follow-up Information    DiRaleigh CallasNP Follow up.   Specialty: Infectious Diseases Why: 12/27/19 at [facility]formation: [id number]        Allergies  Allergen Reactions  . Other Other (See Comments)    [facility] Pt states he can only take these pain meds or else he gets very sick: Oxycontin,morphine,demerol, and dilaudid are the only pain meds pt states he can take.    . 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 2.4.2021: He also reports that he currently take Oxycontin.   . Marland Kitchenxymorphone Other (See Comments)    Causes kidney problems  . Aspirin Nausea And Vomiting    [facility] Beta Vulgaris Nausea And Vomiting    Beets  . Buspirone Other (See Comments)    unknown  . Cabbage Nausea And Vomiting  . Codeine Other (See Comments)    unknown  . Fish Allergy Nausea And Vomiting  . Fish-Derived Products Nausea And Vomiting  . Pentazocine Other (See  Comments)    unknown  . Propoxyphene Other (See Comments)    unknown  . Shellfish Allergy Nausea And Vomiting  . Sulfa Antibiotics Hives  . Sulfasalazine Other (See Comments)  . Amoxicillin Rash    Per [facility]  . Apap [Acetaminophen] Rash    Consultations: ID, nephrology  Procedures/Studies: CT ABDOMEN PELVIS WO CONTRAST  Result Date: 12/02/2019 CLINICAL DATA:  Abdominal pain, fever, vertebral osteomyelitis and discitis EXAM: CT ABDOMEN AND PELVIS WITHOUT CONTRAST TECHNIQUE: Multidetector CT imaging of the abdomen and pelvis was performed following the standard protocol without IV contrast. COMPARISON:  11/23/2019, 11/25/2019, levin 620 FINDINGS: Lower chest: No acute pleural or parenchymal lung disease. Hepatobiliary: No focal liver abnormality is seen. No gallstones, gallbladder wall thickening, or biliary dilatation. Pancreas: Unremarkable. No pancreatic ductal dilatation or surrounding inflammatory changes. Spleen: Normal in size without focal abnormality. Adrenals/Urinary Tract: There is chronic right renal atrophy. No urinary tract calculi or obstructive uropathy. Bladder is unremarkable. Adrenals are normal. Stomach/Bowel: No bowel obstruction or ileus. Normal appendix right lower quadrant. No inflammatory changes. Vascular/Lymphatic: IVC filter is noted. Minimal atherosclerosis of the aorta. No pathologic adenopathy. Reproductive: Prostate is unremarkable. Other: No abdominal wall  hernia or abnormality. No abdominopelvic ascites. Musculoskeletal: Continued destructive changes at the T10/T11 disc space consistent with diskitis and vertebral osteomyelitis. Paraspinal phlegmon is noted. IMPRESSION: 1. Persistent destructive changes from T10/T11 discitis and osteomyelitis. 2. Otherwise no acute intra-abdominal or intrapelvic process. Electronically Signed   By: Randa Ngo M.D.   On: 12/02/2019 19:36   MR THORACIC SPINE WO CONTRAST  Result Date: 11/25/2019 CLINICAL DATA:  T10-11 discitis and osteomyelitis. EXAM: MRI THORACIC AND LUMBAR SPINE WITHOUT CONTRAST TECHNIQUE: Multiplanar and multiecho pulse sequences of the thoracic and lumbar spine were obtained without intravenous contrast. COMPARISON:  CT scans dated 09/17/2019 FINDINGS: MRI THORACIC SPINE FINDINGS Alignment:  Physiologic. Vertebrae: There has been progressive destruction of the inferior aspect of T10 and of the superior aspect of T11. There is prominent fluid in the disc space. The inflammatory process extends into the paraspinal soft tissues to the right and left and anteriorly at T10-11. There is abnormal soft tissue anterior to the thecal sac at T10 and at T11 which could represent epidural abscess. Abnormal edema extends into the pedicles and posterior elements at T10 and T11 with effusions at both facet joints. These combine to compress the thecal sac and slightly compress the spinal cord at T10-11 without discrete myelopathy. Cord: The spinal cord is normal except for slight compression of the spinal cord at T10-11 as described above. Tip of the conus is at T12-L1. Paraspinal and other soft tissues: Abnormal soft tissue surrounds the anterior and lateral aspects of T10 and T11 likely representing cellulitis and paraspinal abscess. The appearance is similar to that seen on the prior CT scan of 09/17/2019. Disc levels: T1-2 through T7-8: Negative. T8-9: Chronic small broad-based disc bulge without neural impingement,  unchanged since the prior CT scan. T9-10: Negative. T10-11: Diffuse osteomyelitis and discitis and perispinal cellulitis and abscess as described above. Bilateral facet effusions are likely infected. The hypertrophied ligamentum flavum and facet joints combine with bulging disc space and destroyed inferior endplate of T1 and superior endplate of C37 combine to create slight spinal stenosis and slight compression of the spinal cord without myelopathy. Epidural soft tissue anteriorly at T10 and T11 likely a represents abscess this was not apparent on the prior unenhanced CT scan. T11-12: Chronic disc space narrowing. Small disc protrusion and annular fissure to  the left of midline. No significant change since the prior CT scan. MRI LUMBAR SPINE FINDINGS Segmentation:  Standard. Alignment: Chronic 3 mm retrolisthesis of L1 on L2. Lumbar scoliosis with convexity to the left centered at L2, stable. Vertebrae:  The spine is fused posteriorly from T12 through S1. Conus medullaris and cauda equina: Conus extends to the T12-L1 level. Conus and cauda equina appear normal. Paraspinal and other soft tissues: Chronic severe atrophy of the right kidney. The bladder is distended. Disc levels: T12-L1: Normal disc. Solid posterior fusion. No neural impingement. L1-2: Chronic slight retrolisthesis with chronic disc space narrowing. Posterior fusion. No neural impingement. L2-3 through L5-S1: Solid chronic fusions at each level with no neural impingement. IMPRESSION: MR OF THE THORACIC SPINE IMPRESSION 1. Progressive osteomyelitis and persistent perispinal cellulitis and abscess at T10-11 with a probable epidural abscess anterior to the thecal sac at T10-11 and T11-12 which was not apparent on the prior unenhanced CT scan of 09/17/2019. 2. Slight compression of the thecal sac and thoracic spinal cord at T10-11 as described above. 3. Chronic degenerative disc disease at T8-9 and T11-12 without focal neural impingement. MR LUMBAR SPINE  IMPRESSION No acute abnormalities of the lumbar spine. The lumbar spine is fused from T12-S1. Electronically Signed   By: Lorriane Shire M.D.   On: 11/25/2019 15:10   MR LUMBAR SPINE WO CONTRAST  Result Date: 11/25/2019 CLINICAL DATA:  T10-11 discitis and osteomyelitis. EXAM: MRI THORACIC AND LUMBAR SPINE WITHOUT CONTRAST TECHNIQUE: Multiplanar and multiecho pulse sequences of the thoracic and lumbar spine were obtained without intravenous contrast. COMPARISON:  CT scans dated 09/17/2019 FINDINGS: MRI THORACIC SPINE FINDINGS Alignment:  Physiologic. Vertebrae: There has been progressive destruction of the inferior aspect of T10 and of the superior aspect of T11. There is prominent fluid in the disc space. The inflammatory process extends into the paraspinal soft tissues to the right and left and anteriorly at T10-11. There is abnormal soft tissue anterior to the thecal sac at T10 and at T11 which could represent epidural abscess. Abnormal edema extends into the pedicles and posterior elements at T10 and T11 with effusions at both facet joints. These combine to compress the thecal sac and slightly compress the spinal cord at T10-11 without discrete myelopathy. Cord: The spinal cord is normal except for slight compression of the spinal cord at T10-11 as described above. Tip of the conus is at T12-L1. Paraspinal and other soft tissues: Abnormal soft tissue surrounds the anterior and lateral aspects of T10 and T11 likely representing cellulitis and paraspinal abscess. The appearance is similar to that seen on the prior CT scan of 09/17/2019. Disc levels: T1-2 through T7-8: Negative. T8-9: Chronic small broad-based disc bulge without neural impingement, unchanged since the prior CT scan. T9-10: Negative. T10-11: Diffuse osteomyelitis and discitis and perispinal cellulitis and abscess as described above. Bilateral facet effusions are likely infected. The hypertrophied ligamentum flavum and facet joints combine with  bulging disc space and destroyed inferior endplate of T1 and superior endplate of P49 combine to create slight spinal stenosis and slight compression of the spinal cord without myelopathy. Epidural soft tissue anteriorly at T10 and T11 likely a represents abscess this was not apparent on the prior unenhanced CT scan. T11-12: Chronic disc space narrowing. Small disc protrusion and annular fissure to the left of midline. No significant change since the prior CT scan. MRI LUMBAR SPINE FINDINGS Segmentation:  Standard. Alignment: Chronic 3 mm retrolisthesis of L1 on L2. Lumbar scoliosis with convexity to the left  centered at L2, stable. Vertebrae:  The spine is fused posteriorly from T12 through S1. Conus medullaris and cauda equina: Conus extends to the T12-L1 level. Conus and cauda equina appear normal. Paraspinal and other soft tissues: Chronic severe atrophy of the right kidney. The bladder is distended. Disc levels: T12-L1: Normal disc. Solid posterior fusion. No neural impingement. L1-2: Chronic slight retrolisthesis with chronic disc space narrowing. Posterior fusion. No neural impingement. L2-3 through L5-S1: Solid chronic fusions at each level with no neural impingement. IMPRESSION: MR OF THE THORACIC SPINE IMPRESSION 1. Progressive osteomyelitis and persistent perispinal cellulitis and abscess at T10-11 with a probable epidural abscess anterior to the thecal sac at T10-11 and T11-12 which was not apparent on the prior unenhanced CT scan of 09/17/2019. 2. Slight compression of the thecal sac and thoracic spinal cord at T10-11 as described above. 3. Chronic degenerative disc disease at T8-9 and T11-12 without focal neural impingement. MR LUMBAR SPINE IMPRESSION No acute abnormalities of the lumbar spine. The lumbar spine is fused from T12-S1. Electronically Signed   By: Lorriane Shire M.D.   On: 11/25/2019 15:10   US RENAL  Result Date: 12/03/2019 CLINICAL DATA:  Acute kidney injury EXAM: RENAL / URINARY  TRACT ULTRASOUND COMPLETE COMPARISON:  09/22/2019 [place] , CT 12/01/2018 FINDINGS: Right Kidney: Renal measurements: 10.5 x 4.1 x 4.3 cm = volume: 94.9 mL. Cortical echogenicity appears slightly increased. Renal cortical thinning is present. No hydronephrosis Left Kidney: Renal measurements: 14.9 x 5.8 x 5.5 cm = volume: 249.9 mL. Cortical echogenicity appears slightly increased. No hydronephrosis. Bladder: Foley catheter in the bladder which is empty. Other: None. IMPRESSION: 1. Negative for hydronephrosis 2. Right kidney smaller than the left with diffuse cortical thinning, consistent with atrophy. 3. Suggestion of increased cortical echogenicity as may be seen with medical renal disease Electronically Signed   By: Donavan Foil M.D.   On: 12/03/2019 18:38   VAS [place] ABI WITH/WO TBI  Result Date: 12/01/2019 LOWER EXTREMITY DOPPLER STUDY Indications: Ulceration, and right BKA. High Risk Factors: Hypertension, Diabetes.  Comparison Study: no prior Performing Technologist: Abram Sander RVS  Examination Guidelines: A complete evaluation includes at minimum, Doppler waveform signals and systolic blood pressure reading at the level of bilateral brachial, anterior tibial, and posterior tibial arteries, when vessel segments are accessible. Bilateral testing is considered an integral part of a complete examination. Photoelectric Plethysmograph (PPG) waveforms and toe systolic pressure readings are included as required and additional duplex testing as needed. Limited examinations for reoccurring indications may be performed as noted.  ABI Findings: +--------+------------------+-----+---------+--------+ Right   Rt Pressure (mmHg)IndexWaveform Comment  +--------+------------------+-----+---------+--------+ [id number]                    triphasic         +--------+------------------+-----+---------+--------+ +--------+------------------+-----+---------+-------+ Left    Lt Pressure (mmHg)IndexWaveform Comment  +--------+------------------+-----+---------+-------+ [id number]                    triphasic        +--------+------------------+-----+---------+-------+ PTA     205               1.31 triphasic        +--------+------------------+-----+---------+-------+ DP      187               1.19 triphasic        +--------+------------------+-----+---------+-------+ +-------+-----------+-----------+------------+------------+ ABI/TBIToday's ABIToday's TBIPrevious ABIPrevious TBI +-------+-----------+-----------+------------+------------+ Left   1.19                                           +-------+-----------+-----------+------------+------------+  Summary: Right: RT BKA. Left: Resting left ankle-brachial index indicates noncompressible left lower extremity arteries.  *See table(s) above for measurements and observations.  Electronically signed by Harold Barban MD on 12/01/2019 at 7:27:49 AM.   Final    IR LUMBAR [place] ASPIRATION W/IMG GUIDE  Result Date: 11/27/2019 INDICATION: 69 year old male referred for disc aspiration with suspected osteomyelitis discitis EXAM: IMAGE GUIDED [place]: The patient is currently admitted to the hospital and receiving intravenous antibiotics. The antibiotics were administered within an appropriate time frame prior to the initiation of the procedure. ANESTHESIA/SEDATION: Fentanyl 100 mcg IV; Versed 2.0 mg IV Moderate Sedation Time:  10 minutes The patient was continuously monitored during the procedure by the interventional radiology nurse under my direct supervision. COMPLICATIONS: None PROCEDURE: Informed written consent was obtained from the patient after a thorough discussion of the procedural risks, benefits and alternatives. All questions were addressed. Maximal Sterile Barrier Technique was utilized including caps, mask, sterile gowns, sterile gloves, sterile drape, hand hygiene and skin antiseptic. A timeout was performed prior to the  initiation of the procedure. Patient was position prone position on the fluoroscopy table. Scout images were acquired to confirm the targeted level T10-T11. 1% lidocaine was used for local anesthesia. A 20 gauge needle was then advanced from a left posterior oblique orientation into the disc space foreign aspirate. Approximately 5 cc of serosanguineous fluid aspirated for a sample. Needles removed and sterile bandage was placed. Patient tolerated the procedure well and remained hemodynamically stable throughout. No complications were encountered and no significant blood loss. IMPRESSION: Status post fluoroscopic guided disc aspirate of T10-T11. Signed, Dulcy Fanny. Dellia Nims, RPVI [facility] Radiology Electronically Signed   By: Corrie Mckusick D.O.   On: 11/27/2019 08:26       Subjective: Patient seen and examined at the bedside this morning.  Hemodynamically stable for discharge  Discharge Exam: Vitals:   12/16/19 0426 12/16/19 0930  BP: 134/87 130/82  Pulse: (!) 55 60  Resp: 20 16  Temp: 98.1 F (36.7 C) 98.6 F (37 C)  SpO2:  100%   Vitals:   12/15/19 1005 12/15/19 2111 12/16/19 0426 12/16/19 0930  BP: 118/64 130/71 134/87 130/82  Pulse: 60 60 (!) 55 60  Resp: '18 20 20 16  ' Temp: 98 F (36.7 C) 98.1 F (36.7 C) 98.1 F (36.7 C) 98.6 F (37 C)  TempSrc: Oral Oral Oral Oral  SpO2: 95%   100%  Weight:      Height:        General: Pt is alert, awake, not in acute distress Cardiovascular: RRR, S1/S2 +, no rubs, no gallops Respiratory: CTA bilaterally, no wheezing, no rhonchi Abdominal: Soft, NT, ND, bowel sounds + Extremities: no edema, no cyanosis, right AKA, left lower extremity wrapped with dressing.    The results of significant diagnostics from this hospitalization (including imaging, microbiology, ancillary and laboratory) are listed below for reference.     Microbiology: No results found for this or any previous visit  (from the past 240 hour(s)).   Labs: BNP (last 3 results) No results for input(s): BNP in the last 8760 hours. Basic Metabolic Panel: Recent Labs  Lab 12/12/19 0512 12/13/19 0355 12/14/19 0515 12/15/19 0350 12/16/19 0805  NA 136 136 136 136 137  K 4.5 4.9 5.0 4.7 4.4  CL 100 101 99 100 103  CO2 '23 27 26 25 25  ' GLUCOSE 162* 151* 193* 185* 223*  BUN 49* 48* 51* 53* 46*  CREATININE 3.77* 3.53* 3.70* 3.71* 3.19*  CALCIUM 9.0 9.1 9.1 9.1 8.9   Liver Function Tests: No results for input(s): AST, ALT, ALKPHOS, BILITOT, PROT, ALBUMIN in the last 168 hours. No results for input(s): LIPASE, AMYLASE in the last 168 hours. No results for input(s): AMMONIA in the last 168 hours. CBC: Recent Labs  Lab 12/10/19 0401  WBC 5.9  NEUTROABS 3.2  HGB 9.6*  HCT 29.2*  MCV 95.4  PLT 261   Cardiac Enzymes: Recent Labs  Lab 12/12/19 0512  CKTOTAL 54   BNP: Invalid input(s): POCBNP CBG: Recent Labs  Lab 12/15/19 1113 12/15/19 1621 12/15/19 2119 12/16/19 0717 12/16/19 1200  GLUCAP 190* 191* 176* 147* 147*   D-Dimer No results for input(s): DDIMER in the last 72 hours. Hgb A1c No results for input(s): HGBA1C in the last 72 hours. Lipid Profile No results for input(s): CHOL, HDL, LDLCALC, TRIG, CHOLHDL, LDLDIRECT in the last 72 hours. Thyroid function studies No results for input(s): TSH, T4TOTAL, T3FREE, THYROIDAB in the last 72 hours.  Invalid input(s): FREET3 Anemia work up No results for input(s): VITAMINB12, FOLATE, FERRITIN, TIBC, IRON, RETICCTPCT in the last 72 hours. Urinalysis    Component Value Date/Time   COLORURINE YELLOW 12/03/2019 1709   APPEARANCEUR CLOUDY (A) 12/03/2019 1709   LABSPEC 1.005 12/03/2019 1709   PHURINE 6.0 12/03/2019 1709   GLUCOSEU NEGATIVE 12/03/2019 1709   HGBUR MODERATE (A) 12/03/2019 1709   BILIRUBINUR NEGATIVE 12/03/2019 1709   KETONESUR NEGATIVE 12/03/2019 1709   PROTEINUR 30 (A) 12/03/2019 1709   NITRITE NEGATIVE 12/03/2019 1709    LEUKOCYTESUR LARGE (A) 12/03/2019 1709   Sepsis Labs Invalid input(s): PROCALCITONIN,  WBC,  LACTICIDVEN Microbiology No results found for this or any previous visit (from the past 240 hour(s)).  Please note: You were cared for by a hospitalist during your hospital stay. Once you are discharged, your primary care physician will handle any further medical issues. Please note that NO REFILLS for any discharge medications will be authorized once you are discharged, as it is imperative that you return to your primary care physician (or establish a relationship with a primary care physician if you do not have one) for your post hospital discharge needs so that they can reassess your need for medications and monitor your lab values.    Time coordinating discharge: 40 minutes  SIGNED:   Shelly Coss, MD  [facility] 12/16/2019, 2:04 PM Pager [phone]  If 7PM-7AM, please contact night-coverage [email or web address] Password [email or web address]

## 2019-12-17 LAB — BASIC METABOLIC PANEL
Anion gap: 10 (ref 5–15)
BUN: 47 mg/dL — ABNORMAL HIGH (ref 8–23)
CO2: 24 mmol/L (ref 22–32)
Calcium: 9.3 mg/dL (ref 8.9–10.3)
Chloride: 104 mmol/L (ref 98–111)
Creatinine, Ser: 3.37 mg/dL — ABNORMAL HIGH (ref 0.61–1.24)
GFR calc Af Amer: 21 mL/min — ABNORMAL LOW (ref >60)
GFR calc non Af Amer: 18 mL/min — ABNORMAL LOW (ref >60)
Glucose, Bld: 155 mg/dL — ABNORMAL HIGH (ref 70–99)
Potassium: 4.5 mmol/L (ref 3.5–5.1)
Sodium: 138 mmol/L (ref 135–145)

## 2019-12-17 LAB — GLUCOSE, CAPILLARY
Glucose-Capillary: 177 mg/dL — ABNORMAL HIGH (ref 70–99)
Glucose-Capillary: 159 mg/dL — ABNORMAL HIGH (ref 70–99)

## 2019-12-17 NOTE — Progress Notes (Signed)
Patient seen and examined at the bedside this morning.  No active issues right now except for back pain which is chronic.  Disorders and summary already in place.  Hemodynamically stable for discharge.

## 2019-12-17 NOTE — Progress Notes (Signed)
CSW made telephone call to [facility] with [facility] [phone] to confirm patient discharge today. There was no answer, CSW left a voicemail.  Criss Alvine, [place]

## 2019-12-17 NOTE — Progress Notes (Signed)
Report called and given to Kenney Houseman, Therapist, sports at [facility].

## 2019-12-17 NOTE — TOC Transition Note (Addendum)
Transition of Care [facility]) - CM/SW Discharge Note   Patient Details  Name: Thomas [name] MRN: [id number] Date of Birth: 17-Dec-1950  Transition of Care [facility]) CM/SW Contact:  Arvella Merles, LCSW Phone Number: 12/17/2019, 9:47 AM   Clinical Narrative:    Patient will DC to: [facility] Anticipated DC date: 12/17/2019 Family notified: Bj, [phone] Transport by: Corey Harold   Per MD patient ready for DC to [place]. RN, patient, patient's family, and facility notified of DC. Discharge Summary and FL2 sent to facility. RN to call report prior to discharge Kenney Houseman [phone]). DC packet on chart. Ambulance transport requested for patient.   CSW will sign off for now as social work intervention is no longer needed. Please consult [place] again if new needs arise.    Final next level of care: [place] Barriers to Discharge: SNF Pending bed offer   Patient Goals and CMS Choice Patient states their goals for this hospitalization and ongoing recovery are:: Patient aware that ST rehab needed before returning home. CMS Medicare.gov Compare Post Acute Care list provided to:: Other (Comment Required)(Patient provided CSW with his facility preferences) Choice offered to / list presented to : (Patient provided CSW with his facility preferences)  Discharge Placement                       Discharge Plan and Services                                     Social Determinants of Health (SDOH) Interventions     Readmission Risk Interventions Readmission Risk Prevention Plan 09/17/2019  Transportation Screening Complete  PCP or Specialist Appt within 5-7 Days Not Complete  Not Complete comments snf vs home  Home Care Screening Complete  Medication Review (RN CM) Complete  Some recent data might be hidden

## 2019-12-17 NOTE — Progress Notes (Signed)
DISCHARGE NOTE SNF [facility] to be discharged to [facility] per MD order. Patient verbalized understanding.  Skin clean, dry and intact without evidence of skin break down, no evidence of skin tears noted. IV catheter intact. Site without signs and symptoms of complications. Pt denies pain at the site currently. No complaints noted.  Discharge packet assembled. An After Visit Summary (AVS) was printed and given to the EMS personnel. Patient escorted via stretcher and discharged to [facility] via ambulance. Report called to accepting facility; all questions and concerns addressed.   Aneta Mins BSN, RN3

## 2019-12-17 NOTE — Progress Notes (Signed)
According to Kenney Houseman, RN at [facility], peripheral IV should remain in place since patient is discharging with IV antibiotics. RF IV left in place and saline locked.

## 2019-12-20 NOTE — Anesthesia Preprocedure Evaluation (Addendum)
Anesthesia Evaluation  Patient identified by MRN, date of birth, ID band Patient awake    Reviewed: Allergy & Precautions, NPO status , Patient's Chart, lab work & pertinent test results, reviewed documented beta blocker date and time   Airway Mallampati: I       Dental  (+) Edentulous Upper, Edentulous Lower   Pulmonary neg pulmonary ROS,    Pulmonary exam normal breath sounds clear to auscultation       Cardiovascular hypertension, Pt. on medications and Pt. on home beta blockers Normal cardiovascular exam Rhythm:Regular Rate:Normal     Neuro/Psych negative neurological ROS  negative psych ROS   GI/Hepatic   Endo/Other  diabetes, Type 2, Oral Hypoglycemic Agents  Renal/GU Renal InsufficiencyRenal disease CKD IV      Musculoskeletal   Abdominal Normal abdominal exam  (+)   Peds  Hematology  (+) Blood dyscrasia, anemia ,   Anesthesia Other Findings   Reproductive/Obstetrics                             Anesthesia Physical  Anesthesia Plan  ASA: II  Anesthesia Plan: General   Post-op Pain Management:    Induction: Intravenous  PONV Risk Score and Plan: 2 and Ondansetron and Treatment may vary due to age or medical condition  Airway Management Planned: Oral ETT  Additional Equipment: None  Intra-op Plan:   Post-operative Plan: Extubation in OR  Informed Consent: I have reviewed the patients History and Physical, chart, labs and discussed the procedure including the risks, benefits and alternatives for the proposed anesthesia with the patient or authorized representative who has indicated his/her understanding and acceptance.     Dental advisory given  Plan Discussed with: CRNA  Anesthesia Plan Comments:         Anesthesia Quick Evaluation

## 2019-12-20 NOTE — Anesthesia Postprocedure Evaluation (Signed)
Anesthesia Post Note  Patient: Thomas [name]  Procedure(s) Performed: Thoracic and Lumbar MRI WITH ANESTHESIA (N/A )     Patient location during evaluation: PACU Anesthesia Type: General Level of consciousness: sedated Pain management: pain level not controlled Vital Signs Assessment: post-procedure vital signs reviewed and stable Respiratory status: spontaneous breathing Cardiovascular status: stable Postop Assessment: no apparent nausea or vomiting Anesthetic complications: no    Last Vitals:  Vitals:   12/17/19 0519 12/17/19 0933  BP: (!) 149/74 135/66  Pulse: 62 64  Resp: 19 18  Temp: 36.9 C 36.8 C  SpO2: 98% 95%    Last Pain:  Vitals:   12/17/19 0933  TempSrc: Oral  PainSc:    Pain Goal: Patients Stated Pain Goal: 1 (12/16/19 0901)                 Huston Foley

## 2019-12-27 ENCOUNTER — Inpatient Hospital Stay: Payer: Medicare Other | Admitting: Infectious Diseases

## 2019-12-28 ENCOUNTER — Inpatient Hospital Stay: Admission: AD | Admit: 2019-12-28 | Payer: Medicare Other | Admitting: Family Medicine

## 2019-12-29 DIAGNOSIS — R338 Other retention of urine: Secondary | ICD-10-CM

## 2019-12-29 DIAGNOSIS — E875 Hyperkalemia: Secondary | ICD-10-CM | POA: Diagnosis not present

## 2019-12-29 DIAGNOSIS — N390 Urinary tract infection, site not specified: Secondary | ICD-10-CM | POA: Diagnosis not present

## 2019-12-29 DIAGNOSIS — R001 Bradycardia, unspecified: Secondary | ICD-10-CM

## 2019-12-29 DIAGNOSIS — N179 Acute kidney failure, unspecified: Secondary | ICD-10-CM | POA: Diagnosis not present

## 2019-12-30 DIAGNOSIS — N179 Acute kidney failure, unspecified: Secondary | ICD-10-CM | POA: Diagnosis not present

## 2019-12-30 DIAGNOSIS — N390 Urinary tract infection, site not specified: Secondary | ICD-10-CM | POA: Diagnosis not present

## 2019-12-30 DIAGNOSIS — E875 Hyperkalemia: Secondary | ICD-10-CM | POA: Diagnosis not present

## 2019-12-30 DIAGNOSIS — R338 Other retention of urine: Secondary | ICD-10-CM | POA: Diagnosis not present

## 2019-12-31 DIAGNOSIS — R338 Other retention of urine: Secondary | ICD-10-CM | POA: Diagnosis not present

## 2019-12-31 DIAGNOSIS — N179 Acute kidney failure, unspecified: Secondary | ICD-10-CM | POA: Diagnosis not present

## 2019-12-31 DIAGNOSIS — E875 Hyperkalemia: Secondary | ICD-10-CM | POA: Diagnosis not present

## 2019-12-31 DIAGNOSIS — N390 Urinary tract infection, site not specified: Secondary | ICD-10-CM | POA: Diagnosis not present

## 2020-01-23 ENCOUNTER — Encounter: Payer: Self-pay | Admitting: Infectious Diseases

## 2020-01-23 ENCOUNTER — Other Ambulatory Visit: Payer: Self-pay

## 2020-01-23 ENCOUNTER — Ambulatory Visit (INDEPENDENT_AMBULATORY_CARE_PROVIDER_SITE_OTHER): Payer: Medicare Other | Admitting: Infectious Diseases

## 2020-01-23 VITALS — BP 110/60 | HR 54

## 2020-01-23 DIAGNOSIS — R7881 Bacteremia: Secondary | ICD-10-CM | POA: Diagnosis not present

## 2020-01-23 DIAGNOSIS — B9562 Methicillin resistant Staphylococcus aureus infection as the cause of diseases classified elsewhere: Secondary | ICD-10-CM

## 2020-01-23 DIAGNOSIS — Z452 Encounter for adjustment and management of vascular access device: Secondary | ICD-10-CM | POA: Diagnosis not present

## 2020-01-23 DIAGNOSIS — M4644 Discitis, unspecified, thoracic region: Secondary | ICD-10-CM | POA: Diagnosis not present

## 2020-01-23 DIAGNOSIS — R5381 Other malaise: Secondary | ICD-10-CM | POA: Diagnosis not present

## 2020-01-23 DIAGNOSIS — G062 Extradural and subdural abscess, unspecified: Secondary | ICD-10-CM | POA: Diagnosis not present

## 2020-01-23 MED ORDER — DOXYCYCLINE HYCLATE 100 MG PO TABS: 100.0000 mg | ORAL_TABLET | Freq: Two times a day (BID) | ORAL | 0 refills | Status: DC

## 2020-01-23 NOTE — Progress Notes (Signed)
Per verbal order from Janene Madeira, NP, 50 cm Single Lumen Peripherally Inserted Central Catheter removed from left basilic, tip intact. No sutures present. RN confirmed length per chart. Dressing was clean and dry. Petroleum dressing applied. Pt advised no heavy lifting with this arm, leave dressing for 24 hours and call the office or seek emergent care if dressing becomes soaked with blood or sharp pain presents. Patient verbalized understanding and agreement.  Patient's questions answered to their satisfaction. Patient tolerated procedure well, RN went with EMS to check out. Landis Gandy, RN

## 2020-01-23 NOTE — Assessment & Plan Note (Signed)
As above.

## 2020-01-23 NOTE — Assessment & Plan Note (Signed)
No findings of recurrent bacteremia nor seeding of other sites. No need for repeated blood cultures today.

## 2020-01-23 NOTE — Assessment & Plan Note (Signed)
PICC removed today - 50cm long and intact per Sharyn Lull, [name]

## 2020-01-23 NOTE — Progress Notes (Signed)
Patient: Thomas [name]  DOB: December 20, 1950 MRN: [id number] PCP: Antonietta Jewel, MD    Patient Active Problem List   Diagnosis Date Noted  . PICC (peripherally inserted central catheter) in place 01/23/2020  . Urinary retention 12/04/2019  . Epidural abscess   . Upper GI bleeding 11/24/2019  . BPH (benign prostatic hyperplasia) 11/24/2019  . CKD (chronic kidney disease), stage IV ([place]) 11/24/2019  . Chronic pain syndrome 11/24/2019  . Physical deconditioning 11/24/2019  . Pressure injury of skin 09/08/2019  . MRSA bacteremia 08/31/2019  . 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 Pulmonary emboli ([place]) 09/29/2018  . [name], right ([place]) 09/29/2018  . [name], type 2) ([place]) 09/29/2018  . Gout 09/29/2018  . HTN (hypertension) 09/29/2018  . CHF (congestive heart failure), NYHA class I, chronic, diastolic ([place]) 57/10/7791  . GERD (gastroesophageal reflux disease) 09/29/2018     Subjective:   Chief Complaint  Patient presents with  . Hospitalization Follow-up    [facility]      Thomas [name] is a 69 y.o. 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 He was previously treated with prolonged course of antibiotics in November 2020 and outpatient Dalbavancin however had recurrence and progression of osteomyelitis/discitis with newly identified possible epidural abscess as mentioned above (unable to get MRI in November 2020, only CT).   He was treated with Daptomycin until he was discharged to [facility] on 12/17/2019. Antibiotics were changed to Ceftaroline at that time to accommodate for cost at [facility], which completed April 2nd. During this time he has had some improvement in back pain and tenderness. He has not done much regarding moving around and has been pretty confined to a bed doing only upper body  exercises. He has not had any further anterior hip/psoas muscle pains like he was experiencing during his recent hospitalization in February 2021. He is planning to start rehabilitation for lower body after today's appointment if he is cleared to do so today.   Denies any fevers, chills, night sweats. Describes his back pain to be 6.5 / 10 today which is about what he is used to after his fracture dating back to when he was a young man. No trouble with antibiotics or PICC line.   He has a chronic wound on the left #2 toe at the MTP head that has healed over from what he thinks. Has itchy skin from tape and frequently picks - several lesions on his arms. Lower extremities have some wounds from tight dressing material recently.    Review of Systems  Constitutional: Negative for chills and fever.  HENT: Negative for tinnitus.   Eyes: Negative for photophobia.  Respiratory: Negative for cough.   Cardiovascular: Positive for leg swelling. Negative for chest pain.  Gastrointestinal: Negative for diarrhea, nausea and vomiting.  Genitourinary: Negative for dysuria.  Musculoskeletal: Positive for back pain.  Skin: Positive for wound. Negative for rash (L foot, arms).  Neurological: Negative for dizziness and headaches.     Past Medical History:  Diagnosis Date  . Arthritis   . BPH (benign prostatic hyperplasia) 11/24/2019  . Chronic pain syndrome 11/24/2019  . CKD (chronic kidney disease), stage IV ([place]) 11/24/2019  . Diabetes mellitus without complication ([place])   . Gout   . Hypertension   . Muscle pain   . Physical  deconditioning 11/24/2019  . Scars   . Swelling     Outpatient Medications Prior to Visit  Medication Sig Dispense Refill  . allopurinol (ZYLOPRIM) 100 MG tablet Take 100 mg by mouth 2 (two) times daily.    Marland Kitchen amLODipine (NORVASC) 10 MG tablet Take 1 tablet by mouth daily as needed (high blood pressure).     . cloNIDine (CATAPRES) 0.3 MG tablet Take 0.3 mg by mouth 3 (three) times  daily.     . Cyanocobalamin (VITAMIN B-12) 5000 MCG TBDP Take 2,500 mcg by mouth daily.    . ferrous sulfate 325 (65 FE) MG tablet Take 325 mg by mouth 2 (two) times daily.    . finasteride (PROSCAR) 5 MG tablet Take 1 tablet (5 mg total) by mouth daily. 30 tablet 0  . glipiZIDE (GLUCOTROL XL) 10 MG 24 hr tablet Take 10 mg by mouth 2 (two) times daily.     . hydrALAZINE (APRESOLINE) 50 MG tablet Take 1 tablet (50 mg total) by mouth every 8 (eight) hours. (Patient taking differently: Take 50 mg by mouth 3 (three) times daily as needed (high blood pressure). ) 90 tablet 3  . levothyroxine (SYNTHROID) 50 MCG tablet Take 50 mcg by mouth daily before breakfast.     . metoprolol tartrate (LOPRESSOR) 50 MG tablet Take 1 tablet (50 mg total) by mouth 2 (two) times daily. (Patient taking differently: Take 50 mg by mouth 2 (two) times daily as needed (when heart rate is 78 or higher). ) 60 tablet 3  . NEURONTIN 300 MG capsule Take 300 mg by mouth 3 (three) times daily as needed (nerve pain).    . OXYCONTIN 30 MG 12 hr tablet Take 1 tablet (30 mg total) by mouth 3 (three) times daily. 14 tablet 0  . PEGLOTICASE IV Inject 1 Dose into the vein every 14 (fourteen) days.    . polyethylene glycol (MIRALAX / GLYCOLAX) 17 g packet Take 17 g by mouth daily. 14 each 0  . senna-docusate (SENOKOT-S) 8.6-50 MG tablet Take 1 tablet by mouth 2 (two) times daily.    . sodium zirconium cyclosilicate (LOKELMA) 10 g PACK packet Take 10 g by mouth daily.    . tamsulosin (FLOMAX) 0.4 MG CAPS capsule Take 0.4 mg by mouth daily.    . Vitamin D, Ergocalciferol, (DRISDOL) 1.25 MG (50000 UT) CAPS capsule Take 1 capsule by mouth every Saturday.     . lidocaine (LIDODERM) 5 % Place 2 patches onto the skin daily. Remove & Discard patch within 12 hours or as directed by MD 30 patch 0  . Multiple Vitamin (MULTIVITAMIN WITH MINERALS) TABS tablet Take 1 tablet by mouth daily. (Patient not taking: Reported on 01/23/2020)    . pantoprazole  (PROTONIX) 40 MG tablet Take 1 tablet (40 mg total) by mouth daily. (Patient not taking: Reported on 01/23/2020)     No facility-administered medications prior to visit.     Allergies  Allergen Reactions  . Other Other (See Comments)    Blood pressure issues Per [facility] hospital: Pt states he can only take these pain meds or else he gets very sick: Oxycontin,morphine,demerol, and dilaudid are the only pain meds pt states he can take.    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 2.4.2021: He also reports that he currently take Oxycontin.   Marland Kitchen Oxymorphone Other (See Comments)    Causes kidney problems  . Aspirin Nausea And Vomiting  Per [facility]  . Beta Vulgaris Nausea And Vomiting    Beets  . Buspirone Other (See Comments)    unknown  . Cabbage Nausea And Vomiting  . Codeine Other (See Comments)    unknown  . Dolobid [Diflunisal]   . Fish Allergy Nausea And Vomiting  . Fish-Derived Products Nausea And Vomiting  . Methadone   . Pentazocine Other (See Comments)    unknown  . Propoxyphene Other (See Comments)    unknown  . Shellfish Allergy Nausea And Vomiting  . Sulfa Antibiotics Hives  . Sulfasalazine Other (See Comments)  . Vistaril [Hydroxyzine]   . Amoxicillin Rash    Per [place] [Acetaminophen] Rash    Social History   Tobacco Use  . Smoking status: Never Smoker  . Smokeless tobacco: Never Used  Substance Use Topics  . Alcohol use: No  . Drug use: No    Family History  Family history unknown: Yes    Objective:   Vitals:   01/23/20 1354  BP: 110/60  Pulse: (!) 54  SpO2: 94%   There is no height or weight on file to calculate BMI.  Physical Exam Vitals and nursing note reviewed.  Constitutional:      Appearance: He is well-developed.     Comments: Seated comfortably in chair during visit.   HENT:     Mouth/Throat:     Mouth: Mucous membranes are  dry.     Dentition: Normal dentition. No dental abscesses.  Cardiovascular:     Rate and Rhythm: Normal rate and regular rhythm.     Heart sounds: Normal heart sounds. No murmur.  Pulmonary:     Effort: Pulmonary effort is normal.     Breath sounds: Normal breath sounds.  Abdominal:     General: There is no distension.     Palpations: Abdomen is soft.     Tenderness: There is no abdominal tenderness.  Musculoskeletal:     Thoracic back: No edema, deformity, tenderness or bony tenderness.     Right Lower Extremity: Right leg is amputated above knee.  Feet:     Left foot:     Skin integrity: Callus, dry skin and fissure present.  Skin:    General: Skin is warm and dry.     Findings: Rash (picked at rash on right arm, scabbed over. LLE also with scabbed scratched areas amongst chronic stasis dermatitis. ) present.  Neurological:     Mental Status: He is alert and oriented to person, place, and time.  Psychiatric:        Judgment: Judgment normal.     Comments: In good spirits today and engaged in care discussion.    LUE PICC line - clean/dry dressing. Insertion site w/o erythema, tenderness, drainage, cording or distal swelling of affected extremity. Does not draw back.     Lab Results: Lab Results  Component Value Date   WBC 5.9 12/10/2019   HGB 9.6 (L) 12/10/2019   HCT 29.2 (L) 12/10/2019   MCV 95.4 12/10/2019   PLT 261 12/10/2019    Lab Results  Component Value Date   CREATININE 3.37 (H) 12/17/2019   BUN 47 (H) 12/17/2019   NA 138 12/17/2019   K 4.5 12/17/2019   CL 104 12/17/2019   CO2 24 12/17/2019    Lab Results  Component Value Date   ALT 13 12/07/2019   AST 18 12/07/2019   ALKPHOS 80 12/07/2019   BILITOT 0.5 12/07/2019  Assessment & Plan:   Problem List Items Addressed This Visit      Unprioritized   Thoracic discitis    History of MRSA bacteremia in Nov-2020 and no new culture data from aspirate in February 2021. Will call [facility] to  verify sensitivities.   He seems to have had good improvement in the tenderness overlying infected vertebra and overall improved back pain. It is difficult to compare, however, as he has long-standing chronic back pain for a long time and he has not participated in any lower extremity rehab yet. It is reassuring he was able to participate in upper body, however given his thoracic involvement.   Will D/C PICC today and start Doxycycline PO BID for 3-4 weeks until he returns and has a chance to participate in rehab. Will draw CBC, CMP, CRP and ESR today to help with monitoring infection. He has multiple drug allergies but recalls Doxycycline to be OK. Discussed to take with food and remain upright 30 min after taking medication.       PICC (peripherally inserted central catheter) in place    PICC removed today - 50cm long and intact per Sharyn Lull, [name]      Physical deconditioning (Chronic)   MRSA bacteremia - Primary    No findings of recurrent bacteremia nor seeding of other sites. No need for repeated blood cultures today.       Epidural abscess    As above       Relevant Orders   C-reactive protein   Sedimentation rate   CBC   Basic metabolic panel     Return to clinic in 3 weeks with Dr. Tommy Medal or myself to re-evaluate.    Janene Madeira, MSN, NP-C [facility] [place] Pager: [phone] Office: [phone]  01/23/20  2:41 PM

## 2020-01-23 NOTE — Assessment & Plan Note (Addendum)
History of MRSA bacteremia in Nov-2020 and no new culture data from aspirate in February 2021. Will call [facility] to verify sensitivities.   He seems to have had good improvement in the tenderness overlying infected vertebra and overall improved back pain. It is difficult to compare, however, as he has long-standing chronic back pain for a long time and he has not participated in any lower extremity rehab yet. It is reassuring he was able to participate in upper body, however given his thoracic involvement.   Will D/C PICC today and start Doxycycline PO BID for 3-4 weeks until he returns and has a chance to participate in rehab. Will draw CBC, CMP, CRP and ESR today to help with monitoring infection. He has multiple drug allergies but recalls Doxycycline to be OK. Discussed to take with food and remain upright 30 min after taking medication.

## 2020-01-23 NOTE — Patient Instructions (Signed)
Will pull your PICC line today and continue treatment with oral antibiotic pills twice a day - please take with food and remain upright (don't lay down) for 30 minutes after you swallow it.   They can cause senstitvy to the sun so if you are outside in direct sunlight best to wear a hat / long sleeves and sunscreen.   I would like to continue this for you 3-4 weeks to see how you do with ongoing rehab. If you still have weakness or you find that your back pain is much worse we should plan to repeat your MRI. I am encouraged today that the antibiotics we have been giving you are working.   Please return in 3 weeks --> we can make this a virtual appointment if you like to see how things are going.

## 2020-01-24 ENCOUNTER — Telehealth: Payer: Self-pay | Admitting: *Deleted

## 2020-01-24 LAB — BASIC METABOLIC PANEL
BUN/Creatinine Ratio: 11 (calc) (ref 6–22)
BUN: 66 mg/dL — ABNORMAL HIGH (ref 7–25)
CO2: 26 mmol/L (ref 20–32)
Calcium: 9.2 mg/dL (ref 8.6–10.3)
Chloride: 97 mmol/L — ABNORMAL LOW (ref 98–110)
Creat: 5.99 mg/dL — ABNORMAL HIGH (ref 0.70–1.25)
Glucose, Bld: 134 mg/dL — ABNORMAL HIGH (ref 65–99)
Potassium: 4.2 mmol/L (ref 3.5–5.3)
Sodium: 137 mmol/L (ref 135–146)

## 2020-01-24 LAB — CBC
HCT: 30.3 % — ABNORMAL LOW (ref 38.5–50.0)
Hemoglobin: 9.8 g/dL — ABNORMAL LOW (ref 13.2–17.1)
MCH: 30.6 pg (ref 27.0–33.0)
MCHC: 32.3 g/dL (ref 32.0–36.0)
MCV: 94.7 fL (ref 80.0–100.0)
MPV: 11.6 fL (ref 7.5–12.5)
Platelets: 184 10*3/uL (ref 140–400)
RBC: 3.2 10*6/uL — ABNORMAL LOW (ref 4.20–5.80)
RDW: 15.4 % — ABNORMAL HIGH (ref 11.0–15.0)
WBC: 8.5 10*3/uL (ref 3.8–10.8)

## 2020-01-24 LAB — SEDIMENTATION RATE: Sed Rate: 127 mm/h — ABNORMAL HIGH (ref 0–20)

## 2020-01-24 LAB — C-REACTIVE PROTEIN: CRP: 11.3 mg/L — ABNORMAL HIGH (ref <8.0)

## 2020-01-24 NOTE — Telephone Encounter (Signed)
I spoke to his nurse today, Juliann Pulse to inform of the change in labs with Creatinine 5.99 and BUN 66.  Last measured value 5.45 on the 31st but no intervention done per note with NP. I asked her to please get in touch with provider so we can discuss as I worry he needs hospitalization to further evaluate given he was a bit sleepy during visit yesterday and likely to a point uremic. I gave her my cell phone to call me back.  This is not due to his antibiotic and I worry there is another reason causing this.   Inflammatory markers are also persistently elevated to a point where it is hard to interpret related to his vertebral infection. Either way it will not likely be possible to do an outpatient MRI with anesthesia given his AKI and needs higher level of care to monitor post procedure.

## 2020-01-24 NOTE — Telephone Encounter (Signed)
Janene Madeira, NP, left message for nursing at [facility] regarding abnormal labs. Colletta Maryland requested call back to [phone]. Landis Gandy, RN

## 2020-01-24 NOTE — Progress Notes (Signed)
Called patient's nursing home and LVM to call back to discuss and report his abnormal Creatinine of 5.99 with BUN 66 --> we do not have any labs prior to 1 month ago but this has increased acutely from < 4. L. He will likely need to return to the hospital to evaluate further the cause for AKI. I do not think it would be related to his previous antibiotics (Ceftaroline).   Can you try to ring the nursing home again this afternoon when able? Thank you

## 2020-01-24 NOTE — Progress Notes (Signed)
Called [employer] and tetracycline sensitivity </= to 1 indicating doxycycline will be effective to continue treating vertebral infection.

## 2020-02-08 ENCOUNTER — Encounter: Payer: Self-pay | Admitting: Infectious Disease

## 2020-02-08 ENCOUNTER — Ambulatory Visit (INDEPENDENT_AMBULATORY_CARE_PROVIDER_SITE_OTHER): Payer: Medicare Other | Admitting: Infectious Disease

## 2020-02-08 ENCOUNTER — Other Ambulatory Visit: Payer: Self-pay

## 2020-02-08 VITALS — BP 140/90 | HR 84 | Resp 16 | Ht 77.0 in | Wt 224.0 lb

## 2020-02-08 DIAGNOSIS — N184 Chronic kidney disease, stage 4 (severe): Secondary | ICD-10-CM | POA: Diagnosis not present

## 2020-02-08 DIAGNOSIS — M4644 Discitis, unspecified, thoracic region: Secondary | ICD-10-CM

## 2020-02-08 DIAGNOSIS — N179 Acute kidney failure, unspecified: Secondary | ICD-10-CM | POA: Diagnosis not present

## 2020-02-08 DIAGNOSIS — G062 Extradural and subdural abscess, unspecified: Secondary | ICD-10-CM

## 2020-02-08 LAB — BASIC METABOLIC PANEL
Anion gap: 17 — ABNORMAL HIGH (ref 5–15)
BUN: 46 mg/dL — ABNORMAL HIGH (ref 8–23)
CO2: 23 mmol/L (ref 22–32)
Calcium: 9.5 mg/dL (ref 8.9–10.3)
Chloride: 103 mmol/L (ref 98–111)
Creatinine, Ser: 4.69 mg/dL — ABNORMAL HIGH (ref 0.61–1.24)
GFR calc Af Amer: 14 mL/min — ABNORMAL LOW (ref >60)
GFR calc non Af Amer: 12 mL/min — ABNORMAL LOW (ref >60)
Glucose, Bld: 98 mg/dL (ref 70–99)
Potassium: 4.1 mmol/L (ref 3.5–5.1)
Sodium: 143 mmol/L (ref 135–145)

## 2020-02-08 MED ORDER — DOXYCYCLINE HYCLATE 100 MG PO TABS: 100.0000 mg | ORAL_TABLET | Freq: Two times a day (BID) | ORAL | 4 refills | Status: DC

## 2020-02-08 NOTE — Progress Notes (Signed)
Subjective:  Chief complaint: followup for MRSA bacteremia and diskitis   Patient ID: Thomas [name], male    DOB: 1950/11/01, 69 y.o.   MRN: [id number]  HPI  69 y.o. 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 He was previously treated with prolonged course of antibiotics in November 2020 and outpatient Dalbavancin however had recurrence and progression of osteomyelitis/discitis with newly identified possible epidural abscess as mentioned above (unable to get MRI in November 2020, only CT).   He was treated with Daptomycin until he was discharged to [facility] on 12/17/2019. Antibiotics were changed to Ceftaroline at that time to accommodate for cost at [facility], which completed April 2nd. During this time he has had some improvement in back pain and tenderness. He has not done much regarding moving around and has been pretty confined to a bed doing only upper body exercises. He has not had any further anterior hip/psoas muscle pains like he was experiencing during his recent hospitalization in February 2021. He was planning to start rehabilitation for lower body after appointment with Janene Madeira on April 5th, 2021.    Described his back pain to be 6.5 / 10 today which is about what he is used to after his fracture dating back to when he was a young man. .   He has a chronic wound on the left #2 toe at the MTP head that has healed over from what he thinks.  PICC line was removed at that visit.    He was switched to po doxycyline 100mg  bid  He did have SIG worsening renal failure at that visit when labs were drawn.  Multiple attempts to have pt promptly seen by PCP, Nephrology made.  In the interim he has seen Nephrology yesterday in [facility].  He is NOW participating in PT. He is riding exercise bike. His back pain is DRAMATICALLY improved vs last  visit.  He is still with some lower ext weakness which he believes is due to his deconditioning and weight loss  He has BKA on the right.  He tells me that he had extensive hardware placed in his spine in the 1980s. I can see radio-opaque material on CXR but I am having difficulty seeing this on MRI      Past Medical History:  Diagnosis Date  . Arthritis   . BPH (benign prostatic hyperplasia) 11/24/2019  . Chronic pain syndrome 11/24/2019  . CKD (chronic kidney disease), stage IV ([place]) 11/24/2019  . Diabetes mellitus without complication ([place])   . Gout   . Hypertension   . Muscle pain   . Physical deconditioning 11/24/2019  . Scars   . Swelling     Past Surgical History:  Procedure Laterality Date  . BELOW KNEE LEG AMPUTATION Right   . ESOPHAGOGASTRODUODENOSCOPY (EGD) WITH PROPOFOL N/A 11/25/2019   Procedure: ESOPHAGOGASTRODUODENOSCOPY (EGD) WITH PROPOFOL;  Surgeon: Carol Ada, MD;  Location: [place];  Service: Endoscopy;  Laterality: N/A;  . IR FLUORO GUIDE CV LINE RIGHT  09/05/2019  . IR LUMBAR DISC ASPIRATION W/IMG GUIDE  11/26/2019  . IR US GUIDE VASC ACCESS RIGHT  09/05/2019  . RADIOLOGY WITH ANESTHESIA N/A 11/25/2019   Procedure: Thoracic and Lumbar MRI WITH ANESTHESIA;  Surgeon: Radiologist, Medication, MD;  Location: [place];  Service: Radiology;  Laterality: N/A;    Family History  Family history unknown: Yes  Social History   Socioeconomic History  . Marital status: Single    Spouse name: Not on file  . Number of children: Not on file  . Years of education: Not on file  . Highest education level: Not on file  Occupational History  . Not on file  Tobacco Use  . Smoking status: Never Smoker  . Smokeless tobacco: Never Used  Substance and Sexual Activity  . Alcohol use: No  . Drug use: No  . Sexual activity: Not on file  Other Topics Concern  . Not on file  Social History Narrative  . Not on file   Social Determinants of Health   Financial  Resource Strain:   . Difficulty of Paying Living Expenses:   Food Insecurity:   . Worried About Charity fundraiser in the Last Year:   . Arboriculturist in the Last Year:   Transportation Needs:   . Film/video editor (Medical):   Marland Kitchen Lack of Transportation (Non-Medical):   Physical Activity:   . Days of Exercise per Week:   . Minutes of Exercise per Session:   Stress:   . Feeling of Stress :   Social Connections:   . Frequency of Communication with Friends and Family:   . Frequency of Social Gatherings with Friends and Family:   . Attends Religious Services:   . Active Member of Clubs or Organizations:   . Attends Archivist Meetings:   Marland Kitchen Marital Status:     Allergies  Allergen Reactions  . Other Other (See Comments)    Blood pressure issues Per [facility]: Pt states he can only take these pain meds or else he gets very sick: Oxycontin,morphine,demerol, and dilaudid are the only pain meds pt states he can take.    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 2.4.2021: He also reports that he currently take Oxycontin.   Marland Kitchen Oxymorphone Other (See Comments)    Causes kidney problems  . Aspirin Nausea And Vomiting    Per [facility]  . Beta Vulgaris Nausea And Vomiting    Beets  . Buspirone Other (See Comments)    unknown  . Cabbage Nausea And Vomiting  . Codeine Other (See Comments)    unknown  . Dolobid [Diflunisal]   . Fish Allergy Nausea And Vomiting  . Fish-Derived Products Nausea And Vomiting  . Methadone   . Pentazocine Other (See Comments)    unknown  . Propoxyphene Other (See Comments)    unknown  . Shellfish Allergy Nausea And Vomiting  . Sulfa Antibiotics Hives  . Sulfasalazine Other (See Comments)  . Vistaril [Hydroxyzine]   . Amoxicillin Rash    Per [facility]  . Apap [Acetaminophen] Rash     Current Outpatient Medications:  .  allopurinol  (ZYLOPRIM) 100 MG tablet, Take 100 mg by mouth 2 (two) times daily., Disp: , Rfl:  .  amLODipine (NORVASC) 10 MG tablet, Take 1 tablet by mouth daily as needed (high blood pressure). , Disp: , Rfl:  .  cloNIDine (CATAPRES) 0.3 MG tablet, Take 0.3 mg by mouth 3 (three) times daily. , Disp: , Rfl:  .  Cyanocobalamin (VITAMIN B-12) 5000 MCG TBDP, Take 2,500 mcg by mouth daily., Disp: , Rfl:  .  doxycycline (VIBRA-TABS) 100 MG tablet, Take 1 tablet (100 mg total) by mouth 2 (two) times daily., Disp: 60 tablet, Rfl: 0 .  ferrous sulfate 325 (  65 FE) MG tablet, Take 325 mg by mouth 2 (two) times daily., Disp: , Rfl:  .  finasteride (PROSCAR) 5 MG tablet, Take 1 tablet (5 mg total) by mouth daily., Disp: 30 tablet, Rfl: 0 .  glipiZIDE (GLUCOTROL XL) 10 MG 24 hr tablet, Take 10 mg by mouth 2 (two) times daily. , Disp: , Rfl:  .  hydrALAZINE (APRESOLINE) 50 MG tablet, Take 1 tablet (50 mg total) by mouth every 8 (eight) hours. (Patient taking differently: Take 50 mg by mouth 3 (three) times daily as needed (high blood pressure). ), Disp: 90 tablet, Rfl: 3 .  levothyroxine (SYNTHROID) 50 MCG tablet, Take 50 mcg by mouth daily before breakfast. , Disp: , Rfl:  .  metoprolol tartrate (LOPRESSOR) 50 MG tablet, Take 1 tablet (50 mg total) by mouth 2 (two) times daily. (Patient taking differently: Take 50 mg by mouth 2 (two) times daily as needed (when heart rate is 78 or higher). ), Disp: 60 tablet, Rfl: 3 .  Multiple Vitamin (MULTIVITAMIN WITH MINERALS) TABS tablet, Take 1 tablet by mouth daily. (Patient not taking: Reported on 01/23/2020), Disp:  , Rfl:  .  NEURONTIN 300 MG capsule, Take 300 mg by mouth 3 (three) times daily as needed (nerve pain)., Disp: , Rfl:  .  OXYCONTIN 30 MG 12 hr tablet, Take 1 tablet (30 mg total) by mouth 3 (three) times daily., Disp: 14 tablet, Rfl: 0 .  pantoprazole (PROTONIX) 40 MG tablet, Take 1 tablet (40 mg total) by mouth daily. (Patient not taking: Reported on 01/23/2020), Disp:  ,  Rfl:  .  PEGLOTICASE IV, Inject 1 Dose into the vein every 14 (fourteen) days., Disp: , Rfl:  .  polyethylene glycol (MIRALAX / GLYCOLAX) 17 g packet, Take 17 g by mouth daily., Disp: 14 each, Rfl: 0 .  senna-docusate (SENOKOT-S) 8.6-50 MG tablet, Take 1 tablet by mouth 2 (two) times daily., Disp:  , Rfl:  .  sodium zirconium cyclosilicate (LOKELMA) 10 g PACK packet, Take 10 g by mouth daily., Disp:  , Rfl:  .  tamsulosin (FLOMAX) 0.4 MG CAPS capsule, Take 0.4 mg by mouth daily., Disp: , Rfl:  .  Vitamin D, Ergocalciferol, (DRISDOL) 1.25 MG (50000 UT) CAPS capsule, Take 1 capsule by mouth every Saturday. , Disp: , Rfl:    Review of Systems  Constitutional: Negative for activity change, appetite change, chills, diaphoresis, fatigue, fever and unexpected weight change.  HENT: Negative for congestion, rhinorrhea, sinus pressure, sneezing, sore throat and trouble swallowing.   Eyes: Negative for photophobia and visual disturbance.  Respiratory: Negative for cough, chest tightness, shortness of breath, wheezing and stridor.   Cardiovascular: Negative for chest pain, palpitations and leg swelling.  Gastrointestinal: Negative for abdominal distention, abdominal pain, anal bleeding, blood in stool, constipation, diarrhea, nausea and vomiting.  Genitourinary: Negative for difficulty urinating, dysuria, flank pain and hematuria.  Musculoskeletal: Positive for back pain. Negative for arthralgias, gait problem, joint swelling and myalgias.  Skin: Negative for color change, pallor, rash and wound.  Neurological: Negative for dizziness, tremors, weakness and light-headedness.  Hematological: Negative for adenopathy. Does not bruise/bleed easily.  Psychiatric/Behavioral: Negative for agitation, behavioral problems, confusion, decreased concentration, dysphoric mood and sleep disturbance.       Objective:   Physical Exam Constitutional:      Appearance: He is well-developed.  HENT:     Head:  Normocephalic and atraumatic.  Eyes:     Conjunctiva/sclera: Conjunctivae normal.  Cardiovascular:     Rate and  Rhythm: Normal rate and regular rhythm.  Pulmonary:     Effort: Pulmonary effort is normal. No respiratory distress.     Breath sounds: No wheezing.  Abdominal:     General: There is no distension.     Palpations: Abdomen is soft.  Musculoskeletal:        General: No tenderness. Normal range of motion.     Cervical back: Normal range of motion and neck supple.  Skin:    General: Skin is warm and dry.     Coloration: Skin is not pale.     Findings: No erythema or rash.  Neurological:     Mental Status: He is alert and oriented to person, place, and time.  Psychiatric:        Mood and Affect: Mood normal.        Behavior: Behavior normal.        Thought Content: Thought content normal.        Judgment: Judgment normal.      Foley catheter in place  Left foot seems to have charcot changes. No purulence, but multiple scabbed areas including over 2nd MTP  Right side with prosthesis      Assessment & Plan:   MRSA bacteremia with diskitis, epidural abscess:  I am bothered by his mentioning of hardware though this was not described by radiology  I also when I personally reviewed his MRI could not discern hardware though I did see radiopaque material on CXR  I will continue doxycycline and get labs today  RTC in one month   Discuss with Radiology proximity of infection to hardware   If hardware might be involved will prolong po doxy  Acute on CKD: check stat BMP today and hopefully this is improved. He did see Nephrology and doxy does not require renal adjustment.

## 2020-02-09 LAB — SEDIMENTATION RATE: Sed Rate: 87 mm/h — ABNORMAL HIGH (ref 0–20)

## 2020-02-09 LAB — C-REACTIVE PROTEIN: CRP: 1.9 mg/L (ref <8.0)

## 2020-03-12 ENCOUNTER — Ambulatory Visit: Payer: Medicare Other | Admitting: Infectious Disease

## 2020-03-26 ENCOUNTER — Ambulatory Visit: Payer: Medicare Other | Admitting: Infectious Disease

## 2020-04-05 DIAGNOSIS — R509 Fever, unspecified: Secondary | ICD-10-CM

## 2020-04-05 DIAGNOSIS — G253 Myoclonus: Secondary | ICD-10-CM | POA: Diagnosis not present

## 2020-04-05 DIAGNOSIS — G9341 Metabolic encephalopathy: Secondary | ICD-10-CM | POA: Diagnosis not present

## 2020-04-05 DIAGNOSIS — E875 Hyperkalemia: Secondary | ICD-10-CM

## 2020-04-05 DIAGNOSIS — L03116 Cellulitis of left lower limb: Secondary | ICD-10-CM

## 2020-04-05 DIAGNOSIS — N185 Chronic kidney disease, stage 5: Secondary | ICD-10-CM

## 2020-04-05 DIAGNOSIS — M549 Dorsalgia, unspecified: Secondary | ICD-10-CM

## 2020-04-06 DIAGNOSIS — G9341 Metabolic encephalopathy: Secondary | ICD-10-CM | POA: Diagnosis not present

## 2020-04-06 DIAGNOSIS — N185 Chronic kidney disease, stage 5: Secondary | ICD-10-CM | POA: Diagnosis not present

## 2020-04-06 DIAGNOSIS — E875 Hyperkalemia: Secondary | ICD-10-CM | POA: Diagnosis not present

## 2020-04-06 DIAGNOSIS — G253 Myoclonus: Secondary | ICD-10-CM | POA: Diagnosis not present

## 2020-04-07 DIAGNOSIS — G9341 Metabolic encephalopathy: Secondary | ICD-10-CM | POA: Diagnosis not present

## 2020-04-07 DIAGNOSIS — N185 Chronic kidney disease, stage 5: Secondary | ICD-10-CM | POA: Diagnosis not present

## 2020-04-07 DIAGNOSIS — E875 Hyperkalemia: Secondary | ICD-10-CM | POA: Diagnosis not present

## 2020-04-07 DIAGNOSIS — G253 Myoclonus: Secondary | ICD-10-CM | POA: Diagnosis not present

## 2020-04-08 DIAGNOSIS — N185 Chronic kidney disease, stage 5: Secondary | ICD-10-CM | POA: Diagnosis not present

## 2020-04-08 DIAGNOSIS — E875 Hyperkalemia: Secondary | ICD-10-CM | POA: Diagnosis not present

## 2020-04-08 DIAGNOSIS — G9341 Metabolic encephalopathy: Secondary | ICD-10-CM | POA: Diagnosis not present

## 2020-04-08 DIAGNOSIS — G253 Myoclonus: Secondary | ICD-10-CM | POA: Diagnosis not present

## 2020-04-09 ENCOUNTER — Inpatient Hospital Stay
Admission: AD | Admit: 2020-04-09 | Payer: Medicare Other | Source: Other Acute Inpatient Hospital | Admitting: Internal Medicine

## 2020-04-09 DIAGNOSIS — G9341 Metabolic encephalopathy: Secondary | ICD-10-CM | POA: Diagnosis not present

## 2020-04-09 DIAGNOSIS — N185 Chronic kidney disease, stage 5: Secondary | ICD-10-CM | POA: Diagnosis not present

## 2020-04-09 DIAGNOSIS — G253 Myoclonus: Secondary | ICD-10-CM | POA: Diagnosis not present

## 2020-04-09 DIAGNOSIS — E875 Hyperkalemia: Secondary | ICD-10-CM | POA: Diagnosis not present

## 2020-04-10 DIAGNOSIS — G9341 Metabolic encephalopathy: Secondary | ICD-10-CM | POA: Diagnosis not present

## 2020-04-10 DIAGNOSIS — N185 Chronic kidney disease, stage 5: Secondary | ICD-10-CM | POA: Diagnosis not present

## 2020-04-10 DIAGNOSIS — G253 Myoclonus: Secondary | ICD-10-CM | POA: Diagnosis not present

## 2020-04-10 DIAGNOSIS — E875 Hyperkalemia: Secondary | ICD-10-CM | POA: Diagnosis not present

## 2020-04-12 DIAGNOSIS — G9341 Metabolic encephalopathy: Secondary | ICD-10-CM | POA: Diagnosis not present

## 2020-04-12 DIAGNOSIS — G253 Myoclonus: Secondary | ICD-10-CM | POA: Diagnosis not present

## 2020-04-12 DIAGNOSIS — E875 Hyperkalemia: Secondary | ICD-10-CM | POA: Diagnosis not present

## 2020-04-12 DIAGNOSIS — N185 Chronic kidney disease, stage 5: Secondary | ICD-10-CM | POA: Diagnosis not present

## 2020-04-13 DIAGNOSIS — E875 Hyperkalemia: Secondary | ICD-10-CM | POA: Diagnosis not present

## 2020-04-13 DIAGNOSIS — N185 Chronic kidney disease, stage 5: Secondary | ICD-10-CM | POA: Diagnosis not present

## 2020-04-13 DIAGNOSIS — G9341 Metabolic encephalopathy: Secondary | ICD-10-CM | POA: Diagnosis not present

## 2020-04-13 DIAGNOSIS — G253 Myoclonus: Secondary | ICD-10-CM | POA: Diagnosis not present

## 2020-04-14 DIAGNOSIS — N185 Chronic kidney disease, stage 5: Secondary | ICD-10-CM | POA: Diagnosis not present

## 2020-04-14 DIAGNOSIS — G9341 Metabolic encephalopathy: Secondary | ICD-10-CM | POA: Diagnosis not present

## 2020-04-14 DIAGNOSIS — G253 Myoclonus: Secondary | ICD-10-CM | POA: Diagnosis not present

## 2020-04-14 DIAGNOSIS — E875 Hyperkalemia: Secondary | ICD-10-CM | POA: Diagnosis not present

## 2020-04-15 DIAGNOSIS — G9341 Metabolic encephalopathy: Secondary | ICD-10-CM | POA: Diagnosis not present

## 2020-04-15 DIAGNOSIS — N185 Chronic kidney disease, stage 5: Secondary | ICD-10-CM | POA: Diagnosis not present

## 2020-04-15 DIAGNOSIS — E875 Hyperkalemia: Secondary | ICD-10-CM | POA: Diagnosis not present

## 2020-04-15 DIAGNOSIS — G253 Myoclonus: Secondary | ICD-10-CM | POA: Diagnosis not present

## 2020-04-16 DIAGNOSIS — N185 Chronic kidney disease, stage 5: Secondary | ICD-10-CM | POA: Diagnosis not present

## 2020-04-16 DIAGNOSIS — G9341 Metabolic encephalopathy: Secondary | ICD-10-CM | POA: Diagnosis not present

## 2020-04-16 DIAGNOSIS — E875 Hyperkalemia: Secondary | ICD-10-CM | POA: Diagnosis not present

## 2020-04-16 DIAGNOSIS — G253 Myoclonus: Secondary | ICD-10-CM | POA: Diagnosis not present

## 2020-04-17 DIAGNOSIS — N185 Chronic kidney disease, stage 5: Secondary | ICD-10-CM | POA: Diagnosis not present

## 2020-04-17 DIAGNOSIS — G253 Myoclonus: Secondary | ICD-10-CM | POA: Diagnosis not present

## 2020-04-17 DIAGNOSIS — E875 Hyperkalemia: Secondary | ICD-10-CM | POA: Diagnosis not present

## 2020-04-17 DIAGNOSIS — G9341 Metabolic encephalopathy: Secondary | ICD-10-CM | POA: Diagnosis not present

## 2020-04-18 DIAGNOSIS — G253 Myoclonus: Secondary | ICD-10-CM | POA: Diagnosis not present

## 2020-04-18 DIAGNOSIS — E875 Hyperkalemia: Secondary | ICD-10-CM | POA: Diagnosis not present

## 2020-04-18 DIAGNOSIS — G9341 Metabolic encephalopathy: Secondary | ICD-10-CM | POA: Diagnosis not present

## 2020-04-18 DIAGNOSIS — N185 Chronic kidney disease, stage 5: Secondary | ICD-10-CM | POA: Diagnosis not present

## 2020-05-07 DIAGNOSIS — I5032 Chronic diastolic (congestive) heart failure: Secondary | ICD-10-CM

## 2020-05-31 ENCOUNTER — Ambulatory Visit: Payer: Medicare Other | Admitting: Family

## 2020-06-26 ENCOUNTER — Other Ambulatory Visit: Payer: Self-pay

## 2020-08-23 DIAGNOSIS — I340 Nonrheumatic mitral (valve) insufficiency: Secondary | ICD-10-CM

## 2020-08-29 ENCOUNTER — Emergency Department (HOSPITAL_COMMUNITY): Payer: Medicare Other

## 2020-08-29 ENCOUNTER — Encounter (HOSPITAL_COMMUNITY): Payer: Self-pay

## 2020-08-29 ENCOUNTER — Inpatient Hospital Stay (HOSPITAL_COMMUNITY)
Admission: EM | Admit: 2020-08-29 | Discharge: 2020-10-03 | DRG: 456 | Disposition: A | Discharge status: Skilled Nursing Facility | Payer: Medicare Other | Attending: Internal Medicine | Admitting: Internal Medicine

## 2020-08-29 DIAGNOSIS — F4321 Adjustment disorder with depressed mood: Secondary | ICD-10-CM | POA: Diagnosis present

## 2020-08-29 DIAGNOSIS — K219 Gastro-esophageal reflux disease without esophagitis: Secondary | ICD-10-CM | POA: Diagnosis present

## 2020-08-29 DIAGNOSIS — G894 Chronic pain syndrome: Secondary | ICD-10-CM | POA: Diagnosis present

## 2020-08-29 DIAGNOSIS — Z886 Allergy status to analgesic agent status: Secondary | ICD-10-CM

## 2020-08-29 DIAGNOSIS — Y9389 Activity, other specified: Secondary | ICD-10-CM

## 2020-08-29 DIAGNOSIS — H5789 Other specified disorders of eye and adnexa: Secondary | ICD-10-CM | POA: Diagnosis present

## 2020-08-29 DIAGNOSIS — E875 Hyperkalemia: Secondary | ICD-10-CM | POA: Diagnosis present

## 2020-08-29 DIAGNOSIS — M5440 Lumbago with sciatica, unspecified side: Secondary | ICD-10-CM | POA: Diagnosis present

## 2020-08-29 DIAGNOSIS — Z8661 Personal history of infections of the central nervous system: Secondary | ICD-10-CM

## 2020-08-29 DIAGNOSIS — E1169 Type 2 diabetes mellitus with other specified complication: Secondary | ICD-10-CM | POA: Diagnosis present

## 2020-08-29 DIAGNOSIS — Z79899 Other long term (current) drug therapy: Secondary | ICD-10-CM

## 2020-08-29 DIAGNOSIS — Z8614 Personal history of Methicillin resistant Staphylococcus aureus infection: Secondary | ICD-10-CM

## 2020-08-29 DIAGNOSIS — R1013 Epigastric pain: Secondary | ICD-10-CM | POA: Diagnosis not present

## 2020-08-29 DIAGNOSIS — M1A9XX1 Chronic gout, unspecified, with tophus (tophi): Secondary | ICD-10-CM | POA: Diagnosis present

## 2020-08-29 DIAGNOSIS — Z419 Encounter for procedure for purposes other than remedying health state, unspecified: Secondary | ICD-10-CM

## 2020-08-29 DIAGNOSIS — R140 Abdominal distension (gaseous): Secondary | ICD-10-CM

## 2020-08-29 DIAGNOSIS — M4624 Osteomyelitis of vertebra, thoracic region: Secondary | ICD-10-CM | POA: Diagnosis not present

## 2020-08-29 DIAGNOSIS — Z89511 Acquired absence of right leg below knee: Secondary | ICD-10-CM

## 2020-08-29 DIAGNOSIS — X500XXA Overexertion from strenuous movement or load, initial encounter: Secondary | ICD-10-CM

## 2020-08-29 DIAGNOSIS — F1120 Opioid dependence, uncomplicated: Secondary | ICD-10-CM | POA: Diagnosis present

## 2020-08-29 DIAGNOSIS — N136 Pyonephrosis: Secondary | ICD-10-CM | POA: Diagnosis present

## 2020-08-29 DIAGNOSIS — N400 Enlarged prostate without lower urinary tract symptoms: Secondary | ICD-10-CM | POA: Diagnosis present

## 2020-08-29 DIAGNOSIS — Z807 Family history of other malignant neoplasms of lymphoid, hematopoietic and related tissues: Secondary | ICD-10-CM

## 2020-08-29 DIAGNOSIS — E1122 Type 2 diabetes mellitus with diabetic chronic kidney disease: Secondary | ICD-10-CM

## 2020-08-29 DIAGNOSIS — D509 Iron deficiency anemia, unspecified: Secondary | ICD-10-CM | POA: Diagnosis present

## 2020-08-29 DIAGNOSIS — Z880 Allergy status to penicillin: Secondary | ICD-10-CM

## 2020-08-29 DIAGNOSIS — E559 Vitamin D deficiency, unspecified: Secondary | ICD-10-CM | POA: Diagnosis present

## 2020-08-29 DIAGNOSIS — R700 Elevated erythrocyte sedimentation rate: Secondary | ICD-10-CM | POA: Diagnosis present

## 2020-08-29 DIAGNOSIS — S24103A Unspecified injury at T7-T10 level of thoracic spinal cord, initial encounter: Secondary | ICD-10-CM | POA: Diagnosis present

## 2020-08-29 DIAGNOSIS — S24154A Other incomplete lesion at T11-T12 level of thoracic spinal cord, initial encounter: Secondary | ICD-10-CM | POA: Diagnosis present

## 2020-08-29 DIAGNOSIS — N390 Urinary tract infection, site not specified: Secondary | ICD-10-CM

## 2020-08-29 DIAGNOSIS — M40204 Unspecified kyphosis, thoracic region: Secondary | ICD-10-CM | POA: Diagnosis present

## 2020-08-29 DIAGNOSIS — Z888 Allergy status to other drugs, medicaments and biological substances status: Secondary | ICD-10-CM

## 2020-08-29 DIAGNOSIS — Z8616 Personal history of COVID-19: Secondary | ICD-10-CM

## 2020-08-29 DIAGNOSIS — Z20822 Contact with and (suspected) exposure to covid-19: Secondary | ICD-10-CM | POA: Diagnosis present

## 2020-08-29 DIAGNOSIS — M4644 Discitis, unspecified, thoracic region: Secondary | ICD-10-CM | POA: Diagnosis present

## 2020-08-29 DIAGNOSIS — T402X5A Adverse effect of other opioids, initial encounter: Secondary | ICD-10-CM | POA: Diagnosis not present

## 2020-08-29 DIAGNOSIS — I878 Other specified disorders of veins: Secondary | ICD-10-CM | POA: Diagnosis present

## 2020-08-29 DIAGNOSIS — K5903 Drug induced constipation: Secondary | ICD-10-CM

## 2020-08-29 DIAGNOSIS — R339 Retention of urine, unspecified: Secondary | ICD-10-CM

## 2020-08-29 DIAGNOSIS — N184 Chronic kidney disease, stage 4 (severe): Secondary | ICD-10-CM | POA: Diagnosis present

## 2020-08-29 DIAGNOSIS — M4804 Spinal stenosis, thoracic region: Secondary | ICD-10-CM | POA: Diagnosis present

## 2020-08-29 DIAGNOSIS — R5381 Other malaise: Secondary | ICD-10-CM | POA: Diagnosis present

## 2020-08-29 DIAGNOSIS — R109 Unspecified abdominal pain: Secondary | ICD-10-CM

## 2020-08-29 DIAGNOSIS — Y92238 Other place in hospital as the place of occurrence of the external cause: Secondary | ICD-10-CM | POA: Diagnosis present

## 2020-08-29 DIAGNOSIS — N179 Acute kidney failure, unspecified: Secondary | ICD-10-CM | POA: Diagnosis present

## 2020-08-29 DIAGNOSIS — Z885 Allergy status to narcotic agent status: Secondary | ICD-10-CM

## 2020-08-29 DIAGNOSIS — G9520 Unspecified cord compression: Secondary | ICD-10-CM

## 2020-08-29 DIAGNOSIS — R059 Cough, unspecified: Secondary | ICD-10-CM

## 2020-08-29 DIAGNOSIS — R52 Pain, unspecified: Secondary | ICD-10-CM

## 2020-08-29 DIAGNOSIS — Z882 Allergy status to sulfonamides status: Secondary | ICD-10-CM

## 2020-08-29 DIAGNOSIS — Z8673 Personal history of transient ischemic attack (TIA), and cerebral infarction without residual deficits: Secondary | ICD-10-CM

## 2020-08-29 DIAGNOSIS — E119 Type 2 diabetes mellitus without complications: Secondary | ICD-10-CM

## 2020-08-29 DIAGNOSIS — M6259 Muscle wasting and atrophy, not elsewhere classified, multiple sites: Secondary | ICD-10-CM | POA: Diagnosis present

## 2020-08-29 DIAGNOSIS — Z86711 Personal history of pulmonary embolism: Secondary | ICD-10-CM

## 2020-08-29 LAB — COMPREHENSIVE METABOLIC PANEL
ALT: 12 U/L (ref 0–44)
AST: 23 U/L (ref 15–41)
Albumin: 3.1 g/dL — ABNORMAL LOW (ref 3.5–5.0)
Alkaline Phosphatase: 56 U/L (ref 38–126)
Anion gap: 10 (ref 5–15)
BUN: 29 mg/dL — ABNORMAL HIGH (ref 8–23)
CO2: 22 mmol/L (ref 22–32)
Calcium: 8.2 mg/dL — ABNORMAL LOW (ref 8.9–10.3)
Chloride: 103 mmol/L (ref 98–111)
Creatinine, Ser: 1.8 mg/dL — ABNORMAL HIGH (ref 0.61–1.24)
GFR, Estimated: 41 mL/min — ABNORMAL LOW (ref >60)
Glucose, Bld: 142 mg/dL — ABNORMAL HIGH (ref 70–99)
Potassium: 4.2 mmol/L (ref 3.5–5.1)
Sodium: 135 mmol/L (ref 135–145)
Total Bilirubin: 0.4 mg/dL (ref 0.3–1.2)
Total Protein: 7.4 g/dL (ref 6.5–8.1)

## 2020-08-29 LAB — CBC WITH DIFFERENTIAL/PLATELET
Abs Immature Granulocytes: 0.11 10*3/uL — ABNORMAL HIGH (ref 0.00–0.07)
Basophils Absolute: 0.1 10*3/uL (ref 0.0–0.1)
Basophils Relative: 1 %
Eosinophils Absolute: 0.3 10*3/uL (ref 0.0–0.5)
Eosinophils Relative: 3 %
HCT: 27.3 % — ABNORMAL LOW (ref 39.0–52.0)
Hemoglobin: 8.7 g/dL — ABNORMAL LOW (ref 13.0–17.0)
Immature Granulocytes: 1 %
Lymphocytes Relative: 21 %
Lymphs Abs: 1.7 10*3/uL (ref 0.7–4.0)
MCH: 31 pg (ref 26.0–34.0)
MCHC: 31.9 g/dL (ref 30.0–36.0)
MCV: 97.2 fL (ref 80.0–100.0)
Monocytes Absolute: 0.6 10*3/uL (ref 0.1–1.0)
Monocytes Relative: 7 %
Neutro Abs: 5.4 10*3/uL (ref 1.7–7.7)
Neutrophils Relative %: 67 %
Platelets: 265 10*3/uL (ref 150–400)
RBC: 2.81 MIL/uL — ABNORMAL LOW (ref 4.22–5.81)
RDW: 16.9 % — ABNORMAL HIGH (ref 11.5–15.5)
WBC: 8.2 10*3/uL (ref 4.0–10.5)
nRBC: 0 % (ref 0.0–0.2)

## 2020-08-29 LAB — C-REACTIVE PROTEIN: CRP: 0.9 mg/dL (ref <1.0)

## 2020-08-29 IMAGING — CT CT L SPINE W/O CM
3 of 4 series · 13 of 33 positions shown, 15 images · non-contrast
Comparison: CT thoracic spine [DATE]

CLINICAL DATA: Back pain

EXAM:
CT THORACIC AND LUMBAR SPINE WITHOUT CONTRAST
TECHNIQUE: Multidetector CT imaging of the thoracic and lumbar spine was
performed without contrast. Multiplanar CT image reconstructions
were also generated.

[Series 4: l spine st · axial · 0.46mm/px · z∈[-723,-539]mm · 5 of 134 slices shown, 7 images]
[im 21/134  soft-tissue]
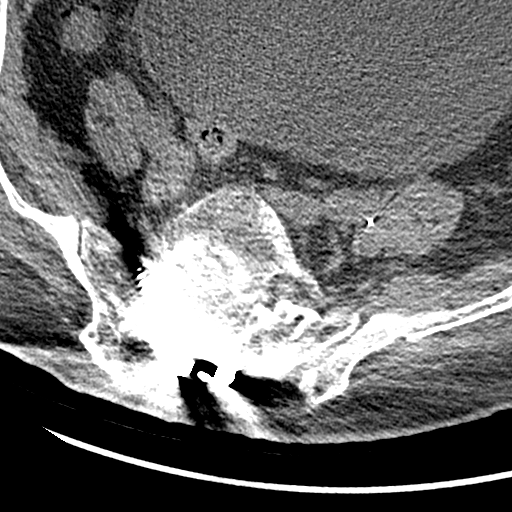
[im 21/134  bone]
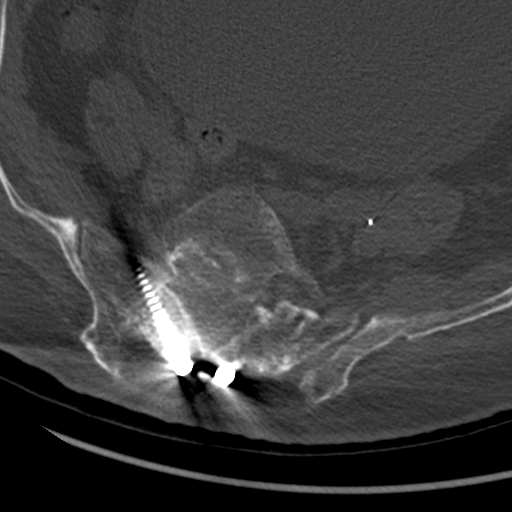
[im 41/134  bone]
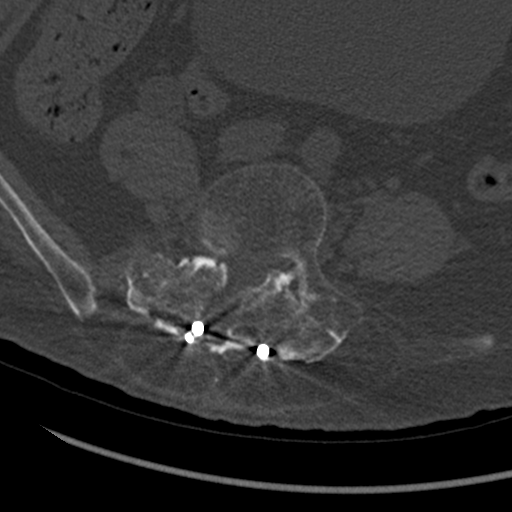
[im 72/134  bone]
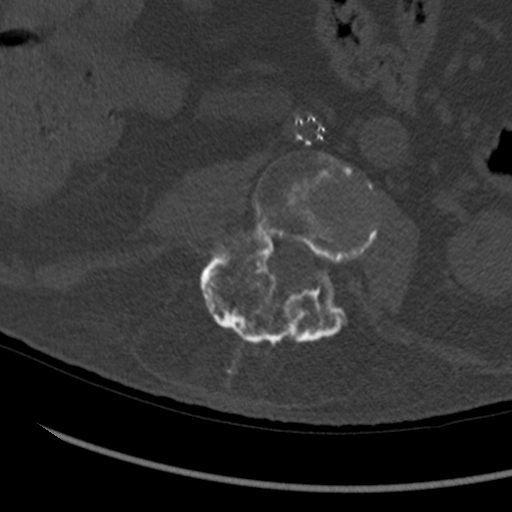
[im 93/134  bone]
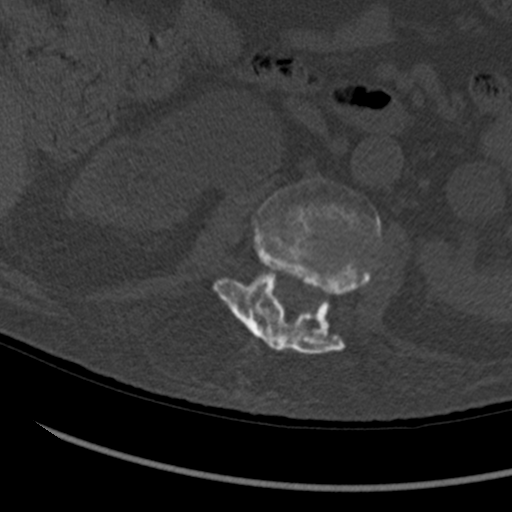
[im 113/134  soft-tissue]
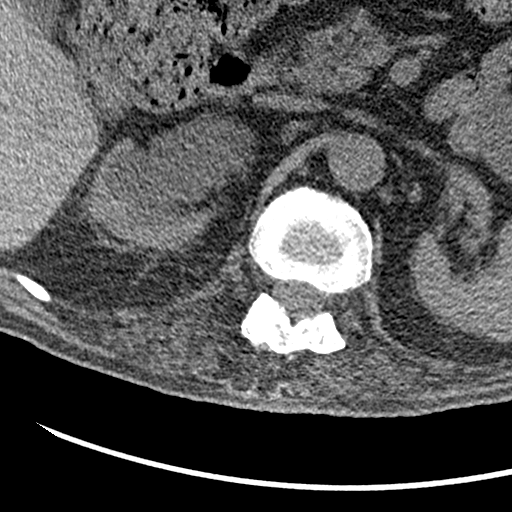
[im 113/134  bone]
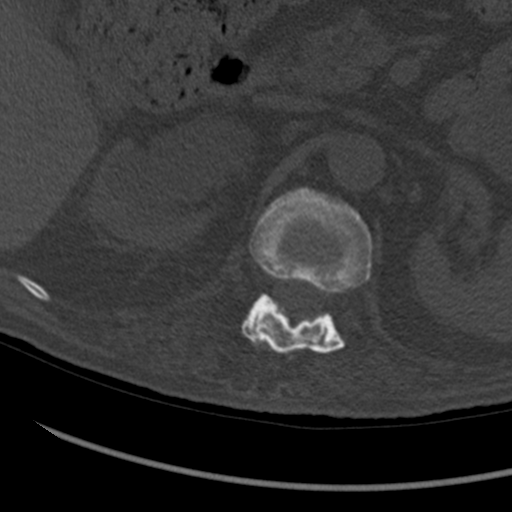

[Series 8: coronal bone · coronal · 0.40mm/px · 3 of 87 slices shown]
[im 18/87  bone]
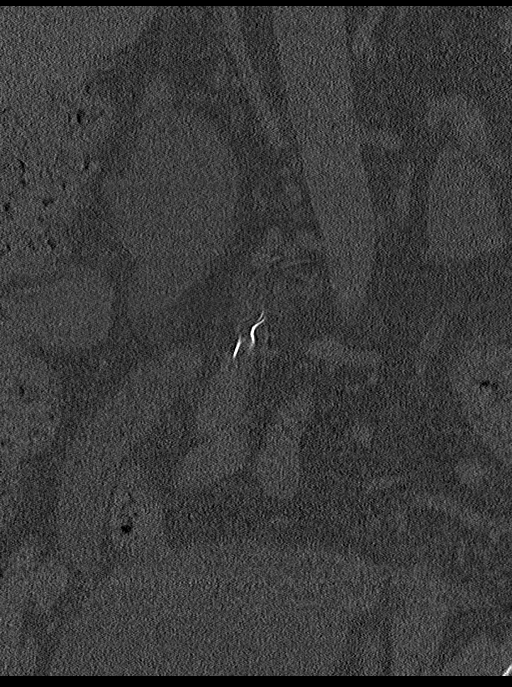
[im 35/87  bone]
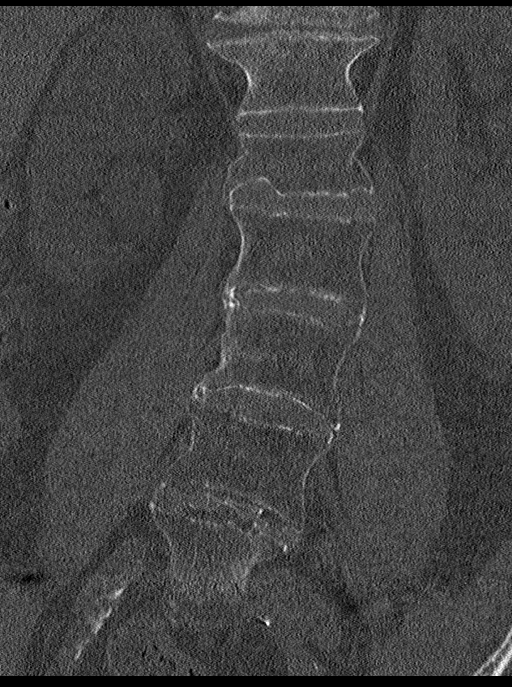
[im 52/87  bone]
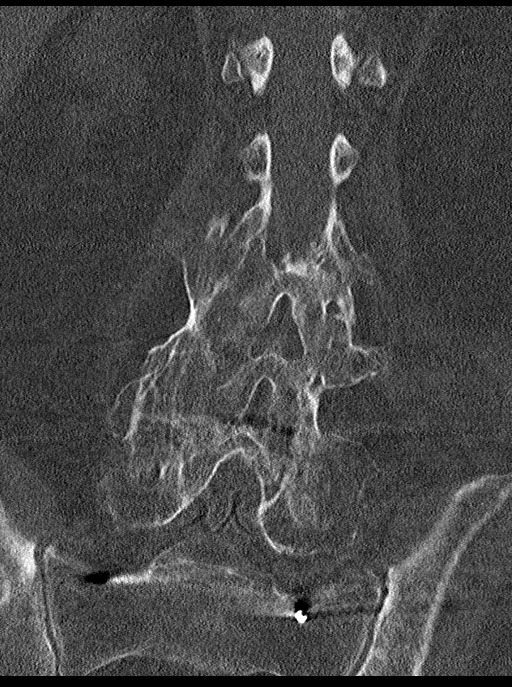

[Series 9: sagittal bone · sagittal · 0.34mm/px · 5 of 103 slices shown]
[im 18/103  bone]
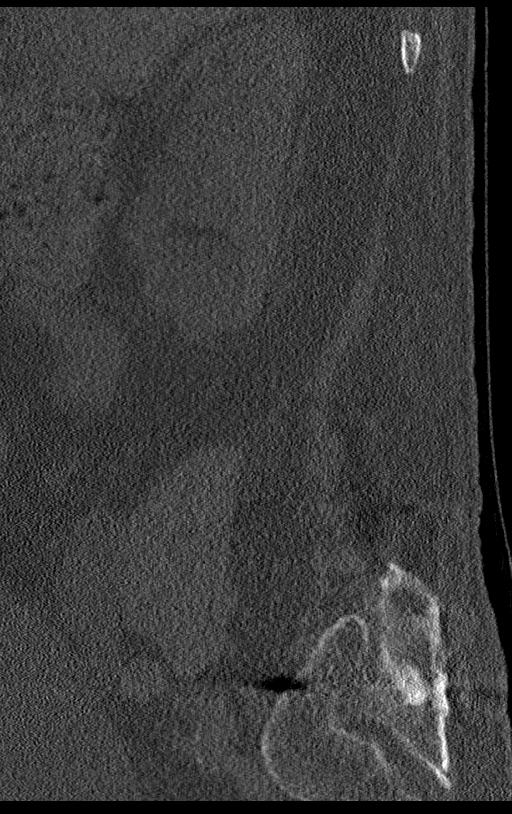
[im 35/103  bone]
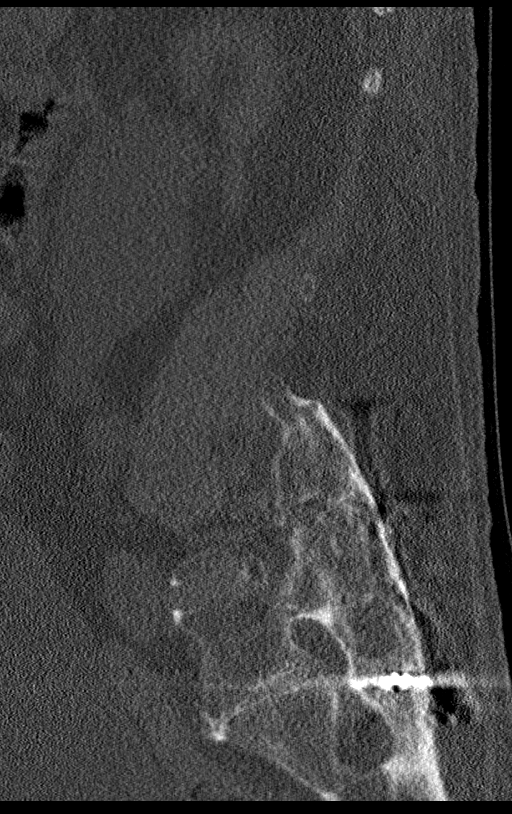
[im 52/103  bone]
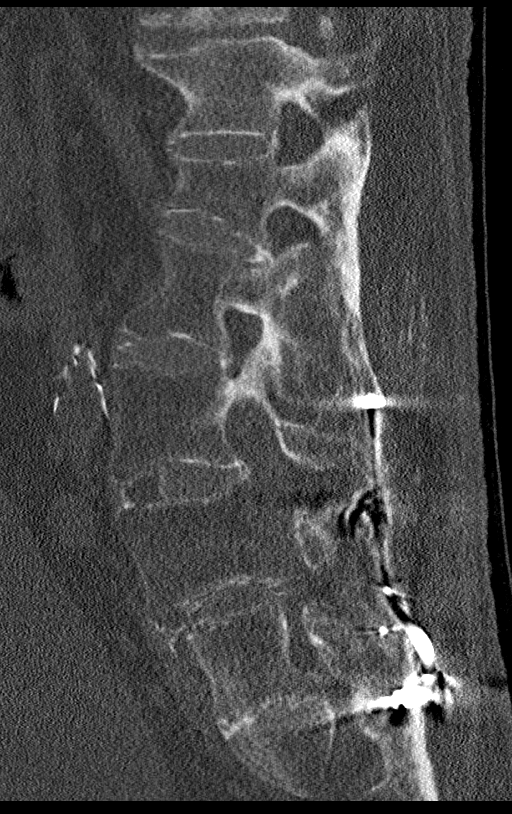
[im 69/103  bone]
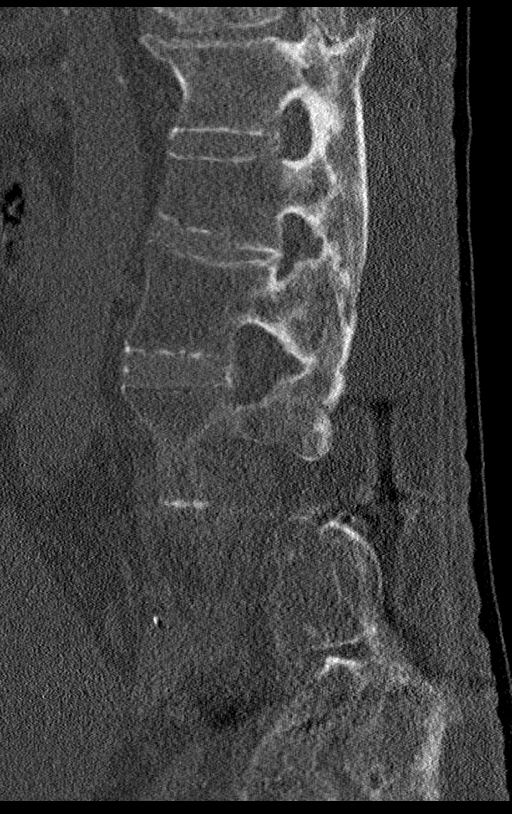
[im 86/103  bone]
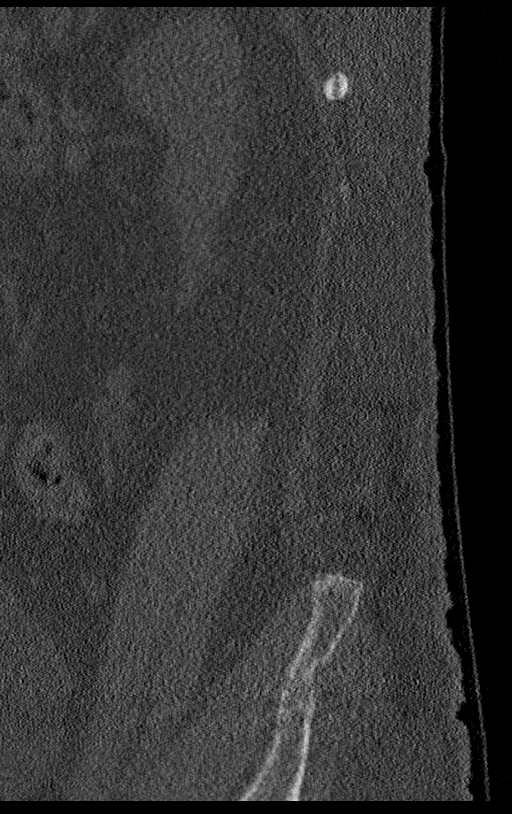

[13 of 33 positions shown; findings below may reference images not displayed]

FINDINGS: CT THORACIC SPINE FINDINGS

Alignment: No static subluxation

Vertebrae: Unchanged appearance of chronic discitis-osteomyelitis at
T10-11. No acute osseous abnormality.

Paraspinal and other soft tissues: Multifocal atelectasis and
peribronchial thickening.

Disc levels: Moderate narrowing of the spinal canal at T10-11,
unchanged.

CT LUMBAR SPINE FINDINGS

Segmentation: 5 lumbar type vertebrae.

Alignment: There is anterior and posterior fusion from T12-S1.

Vertebrae: Chronic height loss of L1, unchanged. No acute fracture.
No focal abnormality involving the lower spinal hardware.

Paraspinal and other soft tissues: Massively distended urinary
bladder with right hydroureteronephrosis. Infrarenal IVC filter.
Calcific aortic atherosclerosis.

Disc levels: There is no high-grade spinal canal stenosis. Mild
right L4 and L5 neural foraminal stenosis.
IMPRESSION: 1. No acute fracture or static subluxation of the thoracic or lumbar
spine.
2. Unchanged appearance of chronic discitis-osteomyelitis at T10-11.
3. Massively distended urinary bladder with right
hydroureteronephrosis.

Aortic Atherosclerosis ([A4]-[A4]).

## 2020-08-29 IMAGING — CT CT T SPINE W/O CM
4 of 7 series · 14 of 33 positions shown, 15 images · non-contrast
Comparison: CT thoracic spine [DATE]

CLINICAL DATA: Back pain

EXAM:
CT THORACIC AND LUMBAR SPINE WITHOUT CONTRAST
TECHNIQUE: Multidetector CT imaging of the thoracic and lumbar spine was
performed without contrast. Multiplanar CT image reconstructions
were also generated.

[Series 7: t spine st · axial · 0.47mm/px · z∈[-429,-231]mm · 4 of 165 slices shown, 5 images]
[im 33/165  soft-tissue]
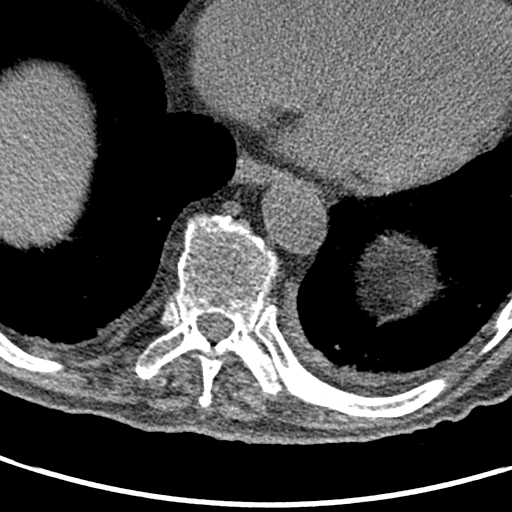
[im 33/165  bone]
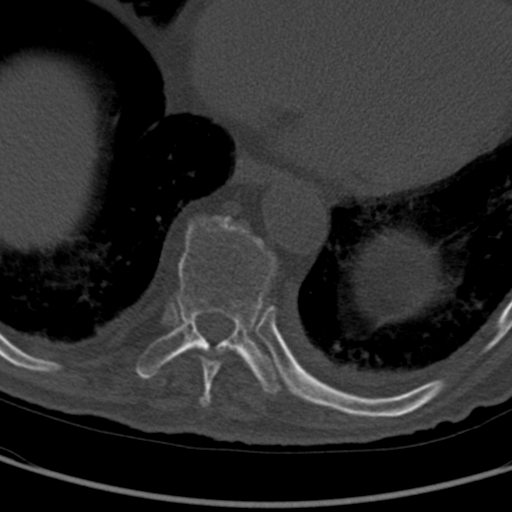
[im 66/165  bone]
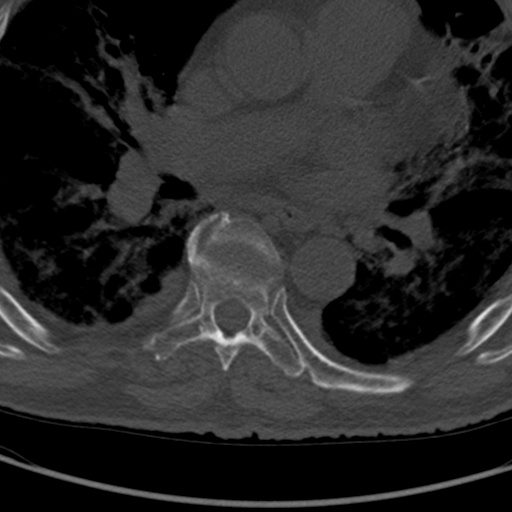
[im 99/165  bone]
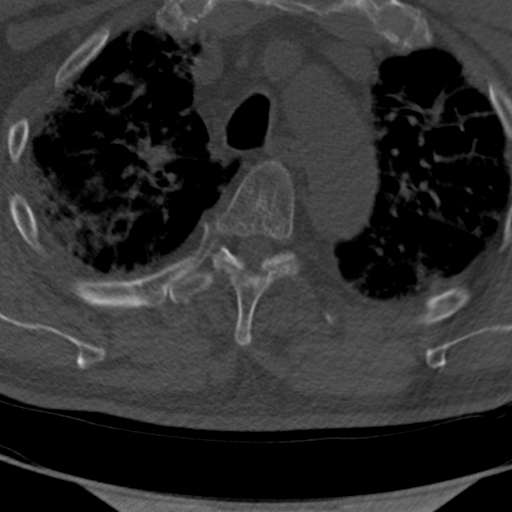
[im 132/165  bone]
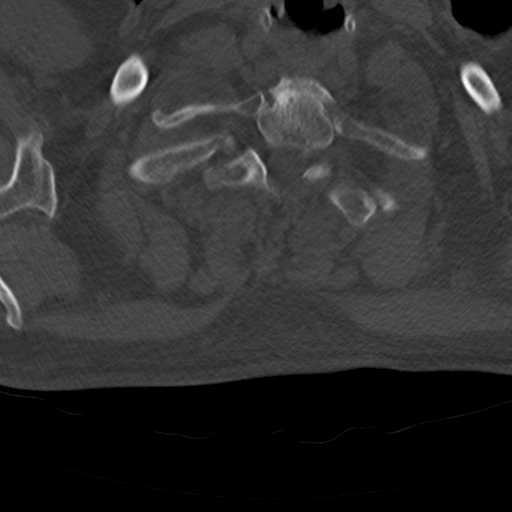

[Series 10: axial bone · axial · 0.34mm/px · z∈[-418,-286]mm · 3 of 166 slices shown]
[im 34/166  bone]
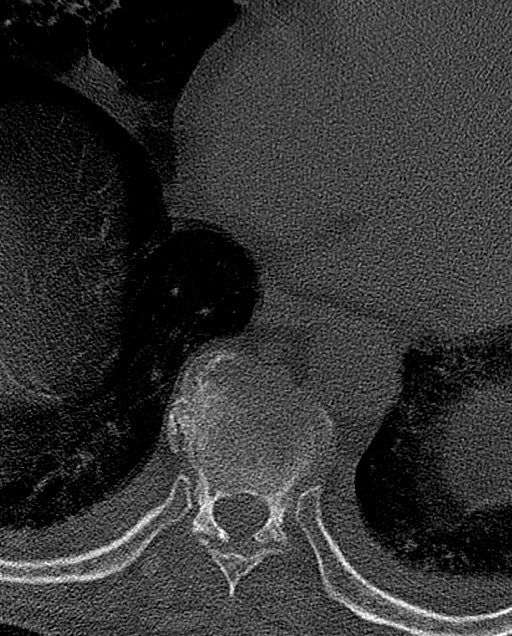
[im 67/166  bone]
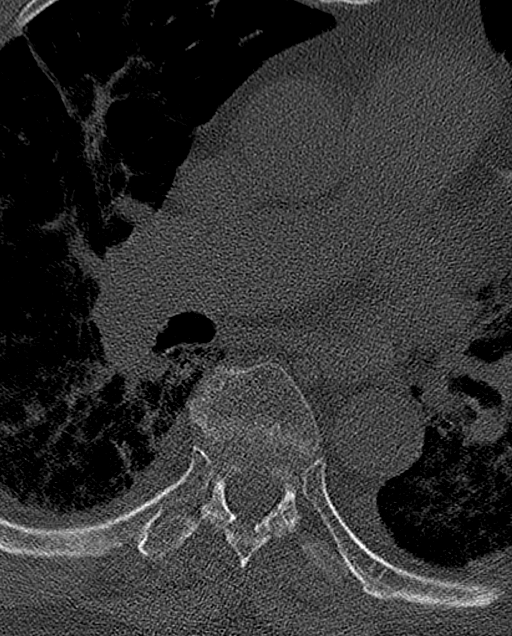
[im 100/166  bone]
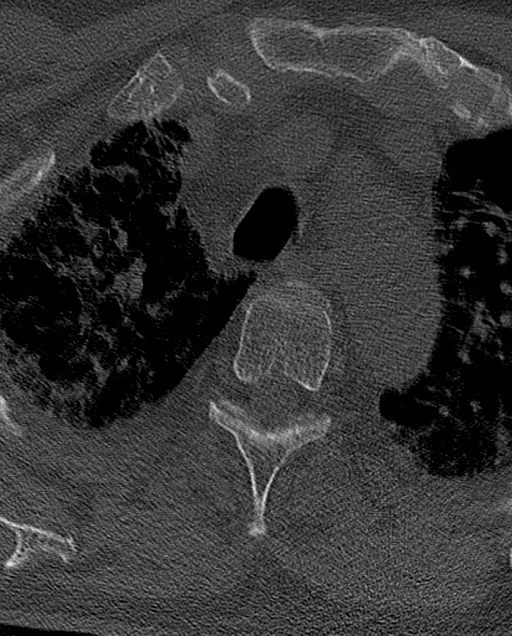

[Series 11: coronal bone · coronal · 0.34mm/px · 2 of 108 slices shown]
[im 36/108  bone]
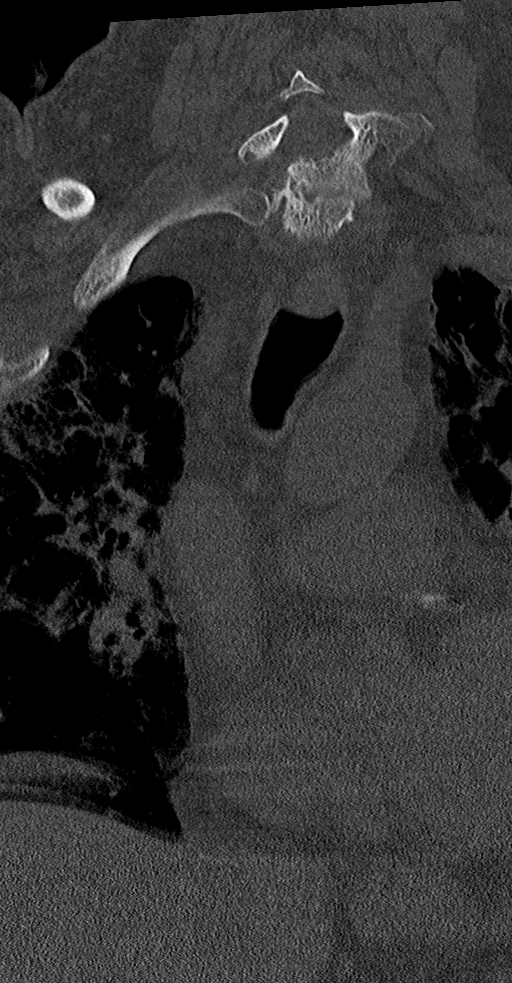
[im 72/108  bone]
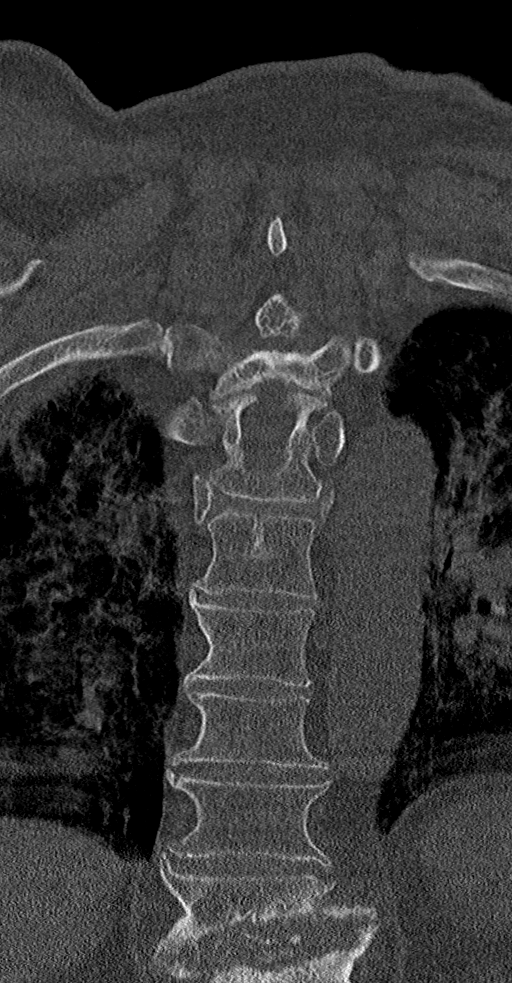

[Series 19: sagittal bone · sagittal · 0.23mm/px · 5 of 92 slices shown]
[im 16/92  bone]
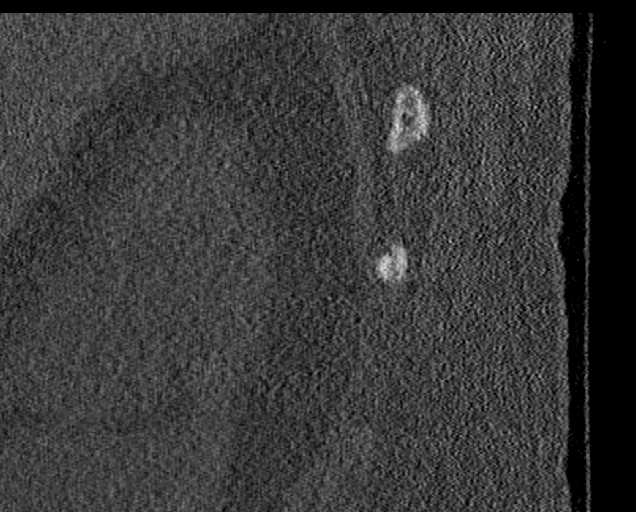
[im 31/92  bone]
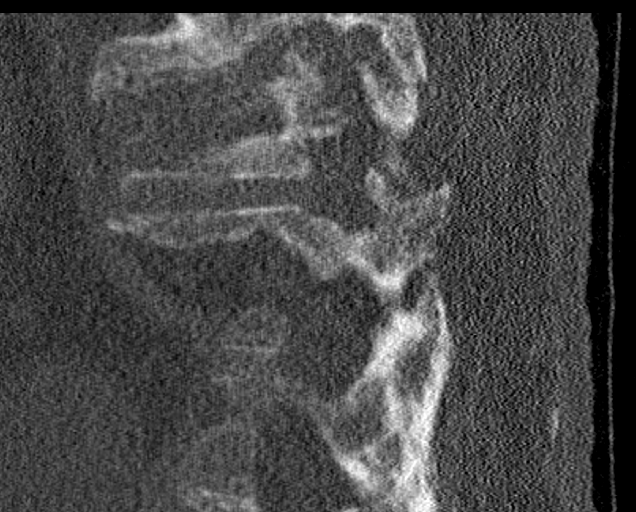
[im 46/92  bone]
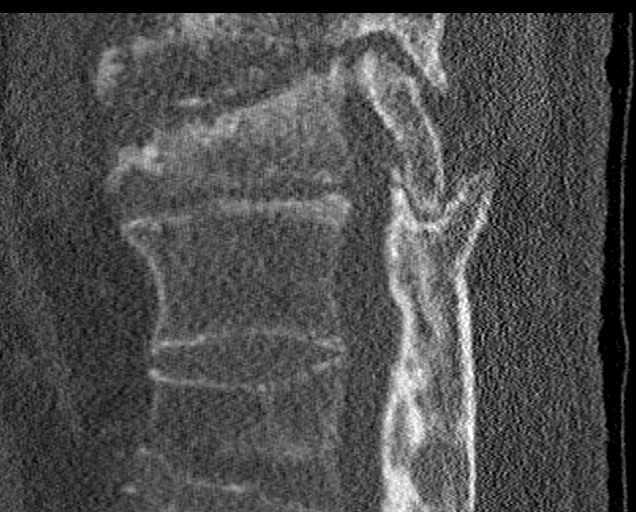
[im 61/92  bone]
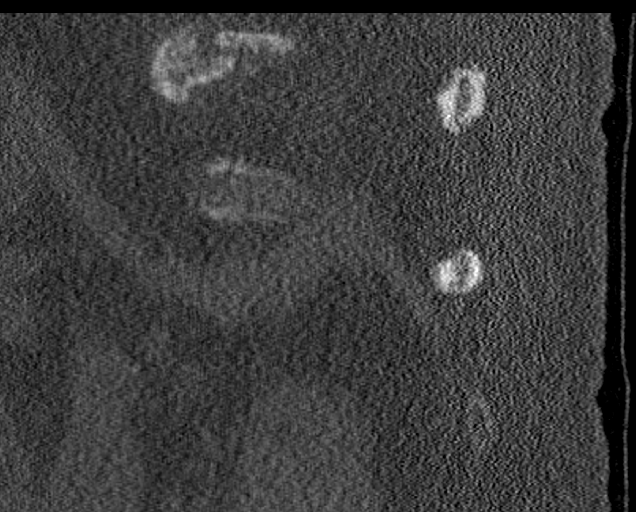
[im 76/92  bone]
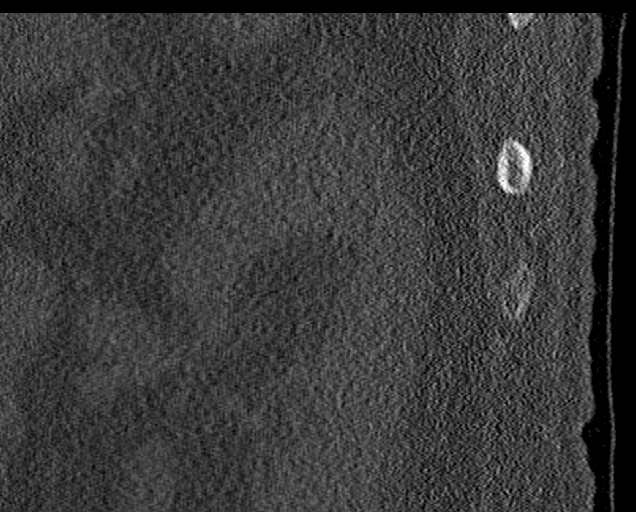

[14 of 33 positions shown; findings below may reference images not displayed]

FINDINGS: CT THORACIC SPINE FINDINGS

Alignment: No static subluxation

Vertebrae: Unchanged appearance of chronic discitis-osteomyelitis at
T10-11. No acute osseous abnormality.

Paraspinal and other soft tissues: Multifocal atelectasis and
peribronchial thickening.

Disc levels: Moderate narrowing of the spinal canal at T10-11,
unchanged.

CT LUMBAR SPINE FINDINGS

Segmentation: 5 lumbar type vertebrae.

Alignment: There is anterior and posterior fusion from T12-S1.

Vertebrae: Chronic height loss of L1, unchanged. No acute fracture.
No focal abnormality involving the lower spinal hardware.

Paraspinal and other soft tissues: Massively distended urinary
bladder with right hydroureteronephrosis. Infrarenal IVC filter.
Calcific aortic atherosclerosis.

Disc levels: There is no high-grade spinal canal stenosis. Mild
right L4 and L5 neural foraminal stenosis.
IMPRESSION: 1. No acute fracture or static subluxation of the thoracic or lumbar
spine.
2. Unchanged appearance of chronic discitis-osteomyelitis at T10-11.
3. Massively distended urinary bladder with right
hydroureteronephrosis.

Aortic Atherosclerosis ([A4]-[A4]).

## 2020-08-29 IMAGING — CT CT CERVICAL SPINE W/O CM
3 of 4 series · 13 of 33 positions shown, 16 images · non-contrast
Comparison: [DATE]

CLINICAL DATA: Pain

EXAM:
CT CERVICAL SPINE WITHOUT CONTRAST
TECHNIQUE: Multidetector CT imaging of the cervical spine was performed without
intravenous contrast. Multiplanar CT image reconstructions were also
generated.

[Series 8: orthogonal bone · axial · 0.29mm/px · z∈[-290,-153]mm · 5 of 101 slices shown, 7 images]
[im 15/101  soft-tissue]
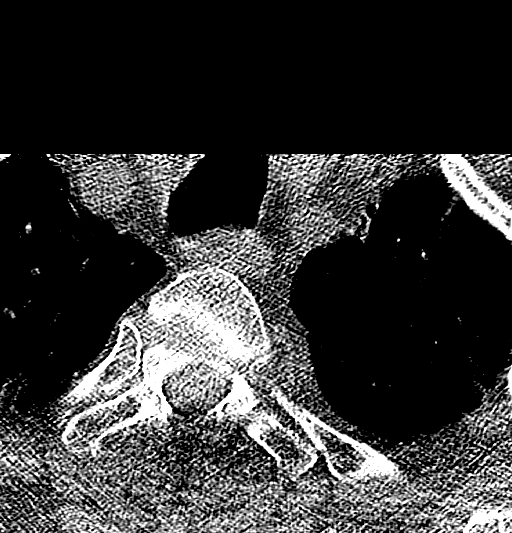
[im 15/101  bone]
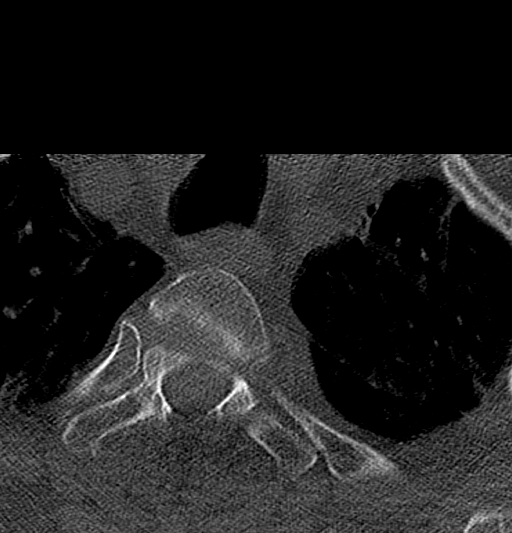
[im 29/101  bone]
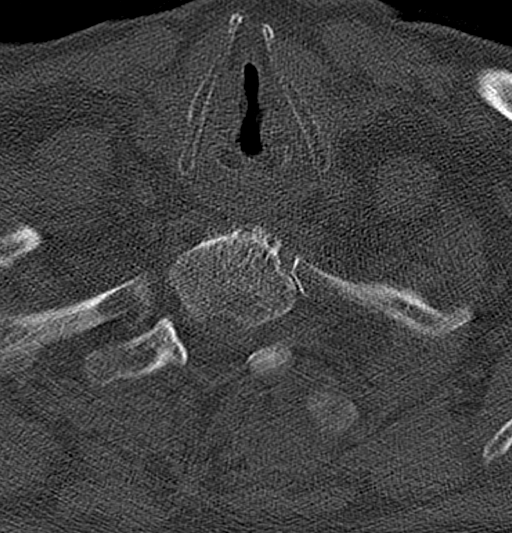
[im 58/101  bone]
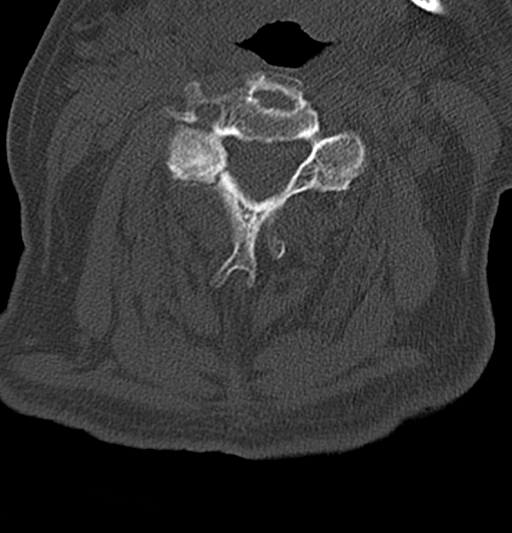
[im 72/101  bone]
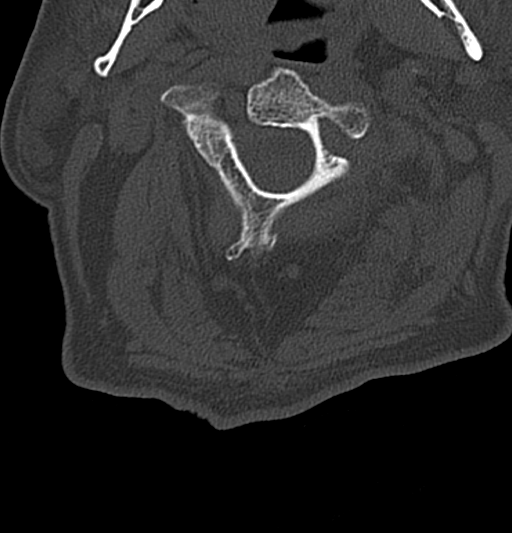
[im 86/101  soft-tissue]
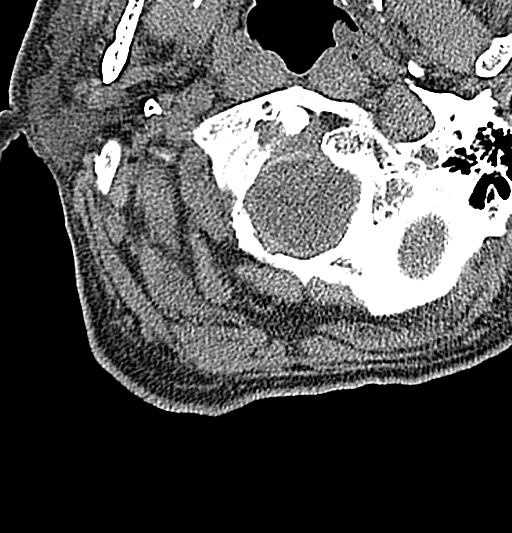
[im 86/101  bone]
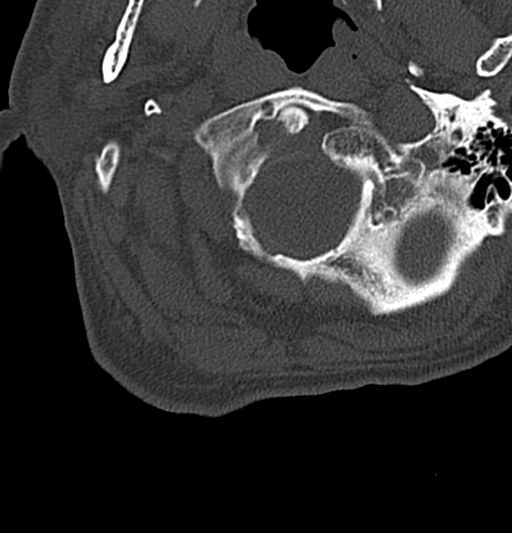

[Series 9: coronal bone · coronal · 0.29mm/px · 3 of 78 slices shown]
[im 16/78  bone]
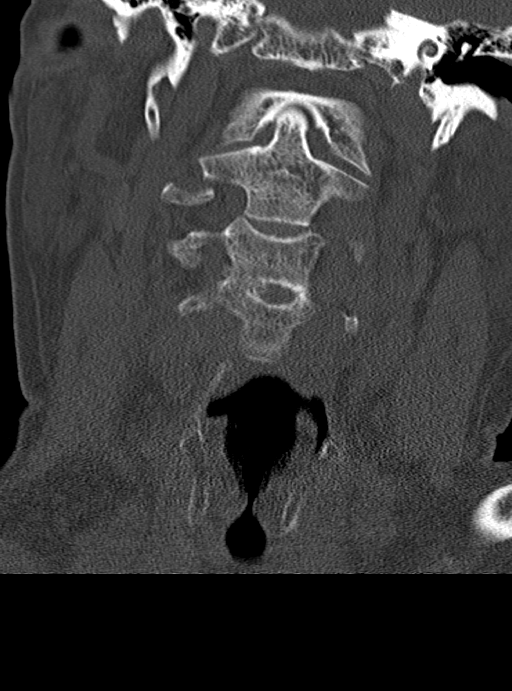
[im 31/78  bone]
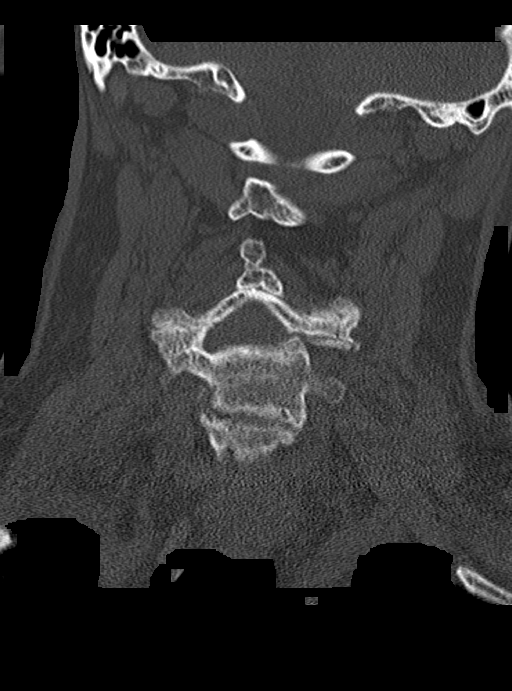
[im 47/78  bone]
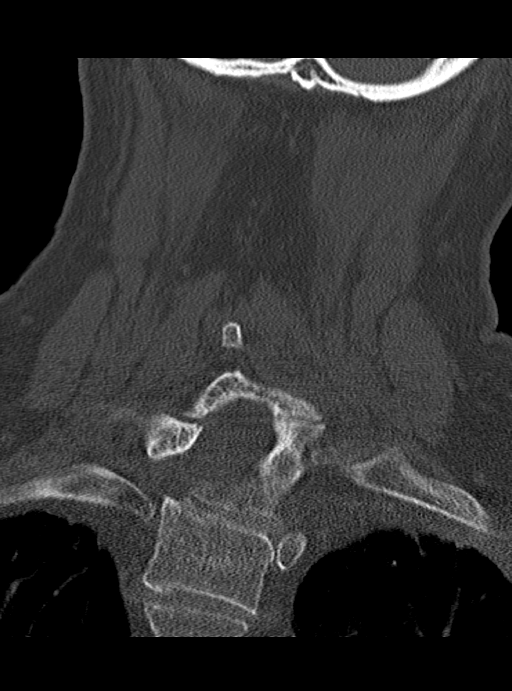

[Series 10: sagittal bone · sagittal · 0.30mm/px · 5 of 75 slices shown, 6 images]
[im 25/75  bone]
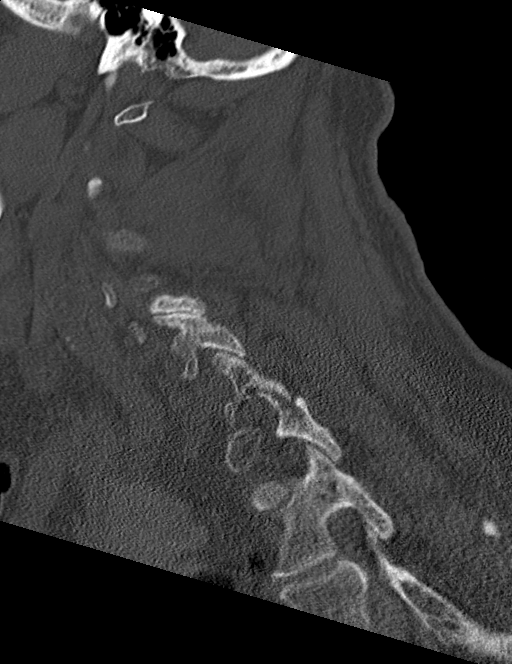
[im 31/75  bone]
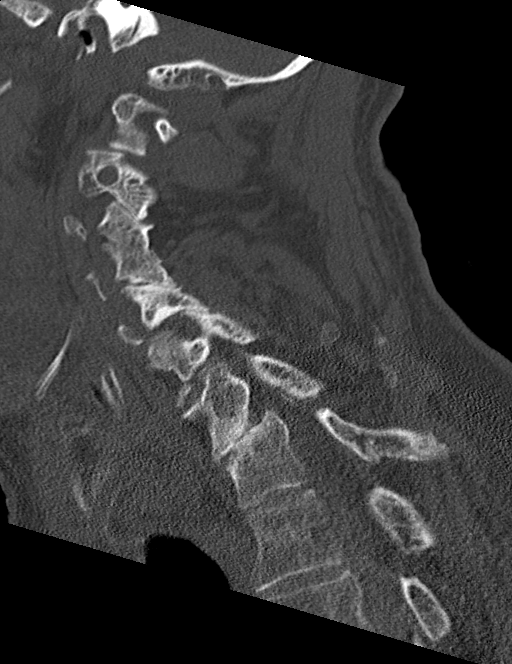
[im 38/75  soft-tissue]
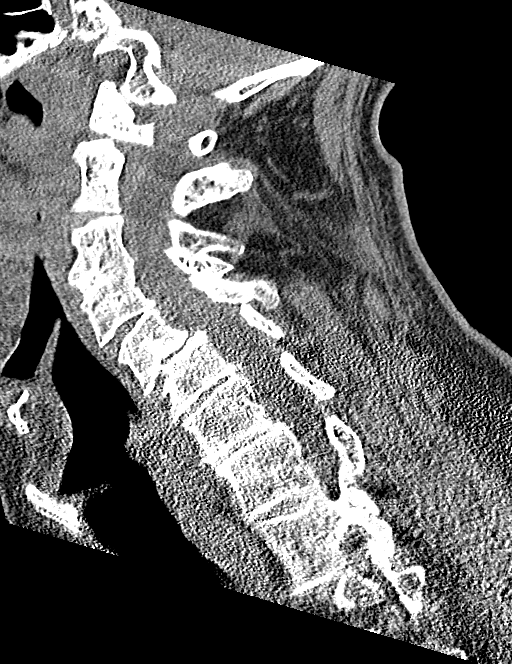
[im 38/75  bone]
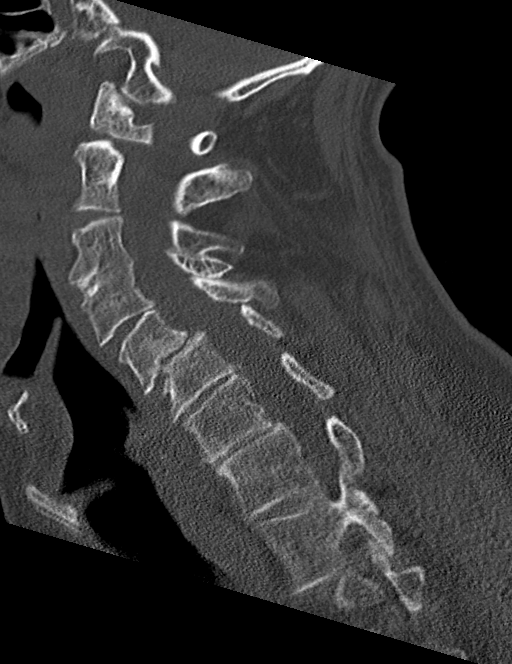
[im 44/75  bone]
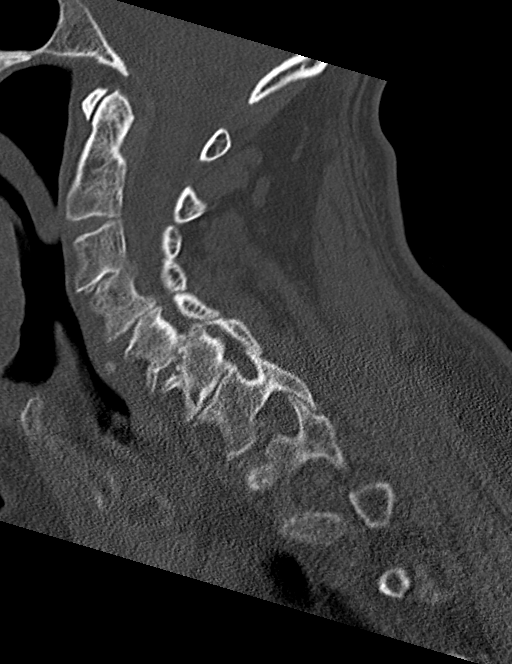
[im 50/75  bone]
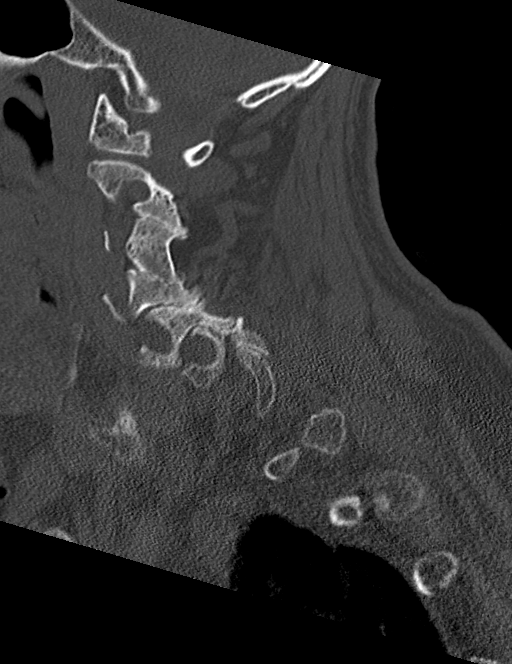

[13 of 33 positions shown; findings below may reference images not displayed]

FINDINGS: Alignment: Normal.

Skull base and vertebrae: No acute fracture. No primary bone lesion
or focal pathologic process.

Soft tissues and spinal canal: No prevertebral fluid or swelling. No
visible canal hematoma.

Disc levels: Multilevel disc height loss is noted throughout the
cervical spine, greatest at the C5-C6, C6-C7, and C7-T1 levels.
There is a degenerative 5 mm anterolisthesis of C5 on C6. There is a
minimal anterolisthesis of C7 on T1.

Upper chest: There are partially visualized ground-glass airspace
opacities in the upper lobes.

Other: None.
IMPRESSION: 1. No acute cervical spine fracture.
2. Multilevel degenerative disc disease of the cervical spine,
greatest at the C5-C6, C6-C7, and C7-T1 levels.
3. Partially visualized ground-glass airspace opacities in the upper
lobes. This may indicate an underlying atypical infectious process.

## 2020-08-29 MED ORDER — HYDROMORPHONE HCL 2 MG PO TABS
4.0000 mg | ORAL_TABLET | Freq: Once | ORAL | Status: AC
  Administered 2020-08-29: 4 mg via ORAL
  Filled 2020-08-29: qty 2

## 2020-08-29 NOTE — ED Provider Notes (Signed)
[place] DEPT Provider Note   CSN: [id number] Arrival date & time: 08/29/20  1751     History Chief Complaint  Patient presents with  . Back Pain    Thomas [name] is a 69 y.o. male.  The history is provided by the patient and medical records.  Back Pain  Thomas [name] is a 69 y.o. male who presents to the Emergency Department complaining of back pain. He presents the emergency department by EMS for evaluation of acute on chronic back pain. 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 He was discharged from the [facility] due to admission for COVID last week. Upon arriving home he thought that he was having a stroke on Thursday of last week. He presented to [facility] for evaluation. He states that on Friday they were transferring him to the MRI scanner when he was twisted and fell on the metal table and experienced a pop and severe pain in his mid thoracic back. The pain radiates to the back of his head to the bottom of his feet. He states after that incident they did not do any additional imaging and they stopped all of his pain medications. Today they discharged him to his home. He lives at home alone. When he arrived home by EMS he stated that he could not live by himself due to severe back pain and he requested transfer to the [facility] emergency department for further evaluation. He denies any fevers, chest pain, shortness of breath, nausea, vomiting, diarrhea, urinary difficulties. He did have constipation, which was treated when he was admitted Breaks. Pain is severe and constant in nature. He states when he attempts to move his legs causes severe pain in his mid and lower back. He has chronic numbness to the left foot, that is at his baseline. He denies any night sweats. He reports that this pain is different than prior episodes of his  osteomyelitis and epidural abscess. He is frustrated because his chronic pain medications were discontinued during his hospital stay and he has uncontrolled pain.     He was admitted to [facility] on October 1 for COVID-19 pneumonia with hypoxia.  records received from [facility]. Patient had a CT head of the brain without contrast on November 4. Report suboptimal evaluation due to motion artifact. No acute abnormality identified. On November 4 he presented to the [facility] emergency department for left sided weakness noticed by his brother when he visited the patient. Patient was concerned at that time for a TIA. He was more confused and having difficulty speaking at the time.  Chest x-ray demonstrated stable bilateral pulmonary infiltrates. [facility] neurology was consulted with recommendation to admit for TIA/stroke workup. He had an echocardiogram performed on November 4. Report states technically difficult study was suboptimal views. Overall normal left ventricular systolic function with an EF between 55 and 60%. Diastolic filling pattern indicates impaired relaxation. Mild mitral regards. Trace tricuspid regurgitation.  Per progress note on November 5 MRI brain and neck were ordered to evaluate TIA/CVA. He initially refused this test. After discussion with hospitalist he was agreeable to MRI. He did have acute kidney injury on that hospitalization with creatinine up to 3.5. He did have mild rhabdomyolysis as well and was treated with IV fluid hydration.  Per progress note on November 6 patient stated that he could not go for MRI as  he got scared it could not lie down. He also stated that he did not want to go home and cannot care for himself. He was evaluated by physical therapy with recommendation for skilled nursing facility for further rehab.  Last progress note available is from November 8. Plan on that progress note states that he is awaiting sniff placement. No  additional information from his hospitalization is available. Past Medical History:  Diagnosis Date  . Arthritis   . BPH (benign prostatic hyperplasia) 11/24/2019  . Chronic pain syndrome 11/24/2019  . CKD (chronic kidney disease), stage IV ([place]) 11/24/2019  . Diabetes mellitus without complication ([place])   . Gout   . Hypertension   . Muscle pain   . Physical deconditioning 11/24/2019  . Scars   . Swelling     Patient Active Problem List   Diagnosis Date Noted  . PICC (peripherally inserted central catheter) in place 01/23/2020  . Urinary retention 12/04/2019  . Epidural abscess   . Upper GI bleeding 11/24/2019  . BPH (benign prostatic hyperplasia) 11/24/2019  . CKD (chronic kidney disease), stage IV ([place]) 11/24/2019  . Chronic pain syndrome 11/24/2019  . Physical deconditioning 11/24/2019  . Pressure injury of skin 09/08/2019  . MRSA bacteremia 08/31/2019  . 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 Pulmonary emboli ([place]) 09/29/2018  . [name], right ([place]) 09/29/2018  . [name], type 2) ([place]) 09/29/2018  . Gout 09/29/2018  . HTN (hypertension) 09/29/2018  . CHF (congestive heart failure), NYHA class I, chronic, diastolic ([place]) 57/10/7791  . GERD (gastroesophageal reflux disease) 09/29/2018    Past Surgical History:  Procedure Laterality Date  . BELOW KNEE LEG AMPUTATION Right   . ESOPHAGOGASTRODUODENOSCOPY (EGD) WITH PROPOFOL N/A 11/25/2019   Procedure: ESOPHAGOGASTRODUODENOSCOPY (EGD) WITH PROPOFOL;  Surgeon: Carol Ada, MD;  Location: [place];  Service: Endoscopy;  Laterality: N/A;  . IR FLUORO GUIDE CV LINE RIGHT  09/05/2019  . IR LUMBAR DISC ASPIRATION W/IMG GUIDE  11/26/2019  . IR US GUIDE VASC ACCESS RIGHT  09/05/2019  . RADIOLOGY WITH ANESTHESIA N/A 11/25/2019   Procedure: Thoracic and Lumbar MRI WITH ANESTHESIA;  Surgeon: Radiologist, Medication, MD;  Location: [place];  Service: Radiology;  Laterality: N/A;       Family History  Family history  unknown: Yes    Social History   Tobacco Use  . Smoking status: Never Smoker  . Smokeless tobacco: Never Used  Vaping Use  . Vaping Use: Never used  Substance Use Topics  . Alcohol use: No  . Drug use: No    Home Medications Prior to Admission medications   Medication Sig Start Date End Date Taking? Authorizing Provider  allopurinol (ZYLOPRIM) 100 MG tablet Take 100 mg by mouth daily.     [provider]  amLODipine (NORVASC) 10 MG tablet Take 1 tablet by mouth daily as needed (high blood pressure).     [provider]  bisacodyl (DULCOLAX) 10 MG suppository Place 10 mg rectally daily as needed for moderate constipation.    [provider]  cloNIDine (CATAPRES) 0.3 MG tablet Take 0.3 mg by mouth 3 (three) times daily.     [provider]  colchicine 0.6 MG tablet Take 0.6 mg by mouth daily.    [provider]  Cyanocobalamin (VITAMIN B-12) 5000 MCG TBDP Take 250 mcg by mouth daily.     [provider]  Docusate Sodium 100 MG capsule Take 100 mg by mouth daily as needed for constipation.  [provider]  doxycycline (VIBRA-TABS) 100 MG tablet Take 1 tablet (100 mg total) by mouth 2 (two) times daily. 02/08/20   Truman Hayward, MD  ferrous sulfate 325 (65 FE) MG tablet Take 325 mg by mouth 2 (two) times daily.    [provider]  finasteride (PROSCAR) 5 MG tablet Take 1 tablet (5 mg total) by mouth daily. 10/10/19   Hosie Poisson, MD  furosemide (LASIX) 40 MG tablet Take 40 mg by mouth daily. 02/06/20   [provider]  insulin NPH-regular Human (70-30) 100 UNIT/ML injection Inject into the skin. Inject per sliding scale: if 0 - 150 = 0 units. Call MD if blood glucose is less than 70; 151 - 200 = 2 units; 201 - 250 = 4 units; 251 - 300 = 6 units; 301- 350 = 8 units; 351+ = 10 units call MD immediately for further instruction if blood glucose is greater than 400, subcutaneously before meals and at  bedtime for sliding scale insulin coverage for diabetes must take finger stick blood glucose prior to administration.    [provider]  levothyroxine (SYNTHROID) 50 MCG tablet Take 50 mcg by mouth daily before breakfast.     [provider]  LIPITOR 20 MG tablet Take 20 mg by mouth daily. 02/06/20   [provider]  meloxicam (MOBIC) 7.5 MG tablet  02/06/20   [provider]  metoprolol tartrate (LOPRESSOR) 50 MG tablet Take 1 tablet (50 mg total) by mouth 2 (two) times daily. 09/08/19   Nita Sells, MD  OXYCONTIN 30 MG 12 hr tablet Take 1 tablet (30 mg total) by mouth 3 (three) times daily. 12/16/19   Shelly Coss, MD  polyethylene glycol (MIRALAX / GLYCOLAX) 17 g packet Take 17 g by mouth daily. 12/16/19   Shelly Coss, MD  ROBAXIN-750 750 MG tablet Take 750 mg by mouth 2 (two) times daily. 02/06/20   [provider]  tamsulosin (FLOMAX) 0.4 MG CAPS capsule Take 0.4 mg by mouth daily.    [provider]  TRADJENTA 5 MG TABS tablet Take 5 mg by mouth daily. 02/06/20   [provider]  Vitamin D, Ergocalciferol, (DRISDOL) 1.25 MG (50000 UT) CAPS capsule Take 1 capsule by mouth every Saturday.     [provider]  ZOFRAN 4 MG tablet Take 4 mg by mouth every 8 (eight) hours as needed. 02/06/20   [provider]    Allergies    Other, Oxycodone, Oxymorphone, Aspirin, Beta vulgaris, Buspirone, Cabbage, Codeine, Dolobid [diflunisal], Fish allergy, Fish-derived products, Methadone, Pentazocine, Propoxyphene, Shellfish allergy, Sulfa antibiotics, Sulfasalazine, Vistaril [hydroxyzine], Amoxicillin, and Apap [acetaminophen]  Review of Systems   Review of Systems  Musculoskeletal: Positive for back pain.  All other systems reviewed and are negative.   Physical Exam Updated Vital Signs BP 118/78 (BP Location: Right Arm)   Pulse 70   Temp 98.8 F (37.1 C) (Oral)   Resp 18   SpO2 97%   Physical Exam Vitals  and nursing note reviewed.  Constitutional:      Appearance: He is well-developed.  HENT:     Head: Normocephalic and atraumatic.  Cardiovascular:     Rate and Rhythm: Normal rate and regular rhythm.     Heart sounds: No murmur heard.   Pulmonary:     Effort: Pulmonary effort is normal. No respiratory distress.     Breath sounds: Normal breath sounds.  Abdominal:     Palpations: Abdomen is soft.  Tenderness: There is no guarding or rebound.     Comments: Mild lower abdominal tenderness  Musculoskeletal:     Comments: Chronic venous stasis changes to the left lower extremity. 2+ left DP pulse. There is a right lower extremity BKA.  Skin:    General: Skin is warm and dry.     Coloration: Skin is not pale.  Neurological:     Mental Status: He is alert and oriented to person, place, and time.     Comments: Altered sensation to light touch in a stocking distribution to the left foot to the distal shin. Five out of five strength in the distal left lower extremity. 4 to 5 strength in the proximal left and right lower extremities. Strength testing in the lower extremities is limited secondary to pain with movement. 4+ out of five strength and bilateral upper extremities.  Psychiatric:        Behavior: Behavior normal.     ED Results / Procedures / Treatments   Labs (all labs ordered are listed, but only abnormal results are displayed) Labs Reviewed  COMPREHENSIVE METABOLIC PANEL - Abnormal; Notable for the following components:      Result Value   Glucose, Bld 142 (*)    BUN 29 (*)    Creatinine, Ser 1.80 (*)    Calcium 8.2 (*)    Albumin 3.1 (*)    GFR, Estimated 41 (*)    All other components within normal limits  CBC WITH DIFFERENTIAL/PLATELET - Abnormal; Notable for the following components:   RBC 2.81 (*)    Hemoglobin 8.7 (*)    HCT 27.3 (*)    RDW 16.9 (*)    Abs Immature Granulocytes 0.11 (*)    All other components within normal limits  CULTURE, BLOOD (ROUTINE X  2)  CULTURE, BLOOD (ROUTINE X 2)  C-REACTIVE PROTEIN  URINALYSIS, ROUTINE W REFLEX MICROSCOPIC  SEDIMENTATION RATE    EKG None  Radiology No results found.  Procedures Procedures (including critical care time)  Medications Ordered in ED Medications  HYDROmorphone (DILAUDID) tablet 4 mg (4 mg Oral Given 08/29/20 2334)    ED Course  I have reviewed the triage vital signs and the nursing notes.  Pertinent labs & imaging results that were available during my care of the patient were reviewed by me and considered in my medical decision making (see chart for details).    MDM Rules/Calculators/A&P                         patient with history of CKD, diabetes, chronic back pain here for evaluation of acute on chronic back pain after twisting injury while he was at another facility. History and exam is limited due to chronic pain and patient's inability to tolerate range of motion and strength testing well due to back pain. He does appear to have upper and lower extremity weakness, weakness is greatest in the proximal lower extremities. Given his history of osteomyelitis is CRP and ESR were obtained. CRP is improved compared to priors, feel acute infection is unlikely in the setting of his history. BMP with improving renal function when compared to priors. Plan to obtain CT scans of his cervical, thoracic and lumbar spine to evaluate for acute bony injuries due to recent injury. MRI is not available at patients time of ED presentation. Patient care transferred pending CT scans.  Final Clinical Impression(s) / ED Diagnoses Final diagnoses:  None    Rx / DC  Orders ED Discharge Orders    None       Quintella Reichert, MD 08/30/20 [phone]

## 2020-08-29 NOTE — ED Triage Notes (Signed)
Pt BIB [facility]. Pt was getting picked up from [facility] by EMS to be transported home; EMS was told brother would be at the home when they arrived. When EMS arrived patients brother was not there; pt can not be left alone due to being total care. Pt began to start screaming stating that his back was hurting due to the nurses at [facility] him on the table. Pt request to come to [facility]. Pt has HX of MRSA.

## 2020-08-30 ENCOUNTER — Other Ambulatory Visit: Payer: Self-pay

## 2020-08-30 ENCOUNTER — Encounter (HOSPITAL_COMMUNITY): Payer: Self-pay

## 2020-08-30 ENCOUNTER — Emergency Department (HOSPITAL_COMMUNITY): Payer: Medicare Other

## 2020-08-30 DIAGNOSIS — E119 Type 2 diabetes mellitus without complications: Secondary | ICD-10-CM | POA: Diagnosis not present

## 2020-08-30 DIAGNOSIS — S24103D Unspecified injury at T7-T10 level of thoracic spinal cord, subsequent encounter: Secondary | ICD-10-CM | POA: Diagnosis not present

## 2020-08-30 DIAGNOSIS — M4804 Spinal stenosis, thoracic region: Secondary | ICD-10-CM | POA: Diagnosis present

## 2020-08-30 DIAGNOSIS — R8271 Bacteriuria: Secondary | ICD-10-CM | POA: Diagnosis not present

## 2020-08-30 DIAGNOSIS — I878 Other specified disorders of veins: Secondary | ICD-10-CM | POA: Diagnosis present

## 2020-08-30 DIAGNOSIS — D509 Iron deficiency anemia, unspecified: Secondary | ICD-10-CM | POA: Diagnosis present

## 2020-08-30 DIAGNOSIS — R339 Retention of urine, unspecified: Secondary | ICD-10-CM | POA: Diagnosis not present

## 2020-08-30 DIAGNOSIS — M4644 Discitis, unspecified, thoracic region: Secondary | ICD-10-CM | POA: Diagnosis present

## 2020-08-30 DIAGNOSIS — N400 Enlarged prostate without lower urinary tract symptoms: Secondary | ICD-10-CM | POA: Diagnosis present

## 2020-08-30 DIAGNOSIS — E559 Vitamin D deficiency, unspecified: Secondary | ICD-10-CM | POA: Diagnosis present

## 2020-08-30 DIAGNOSIS — M5459 Other low back pain: Secondary | ICD-10-CM | POA: Diagnosis not present

## 2020-08-30 DIAGNOSIS — G894 Chronic pain syndrome: Secondary | ICD-10-CM | POA: Diagnosis present

## 2020-08-30 DIAGNOSIS — N179 Acute kidney failure, unspecified: Secondary | ICD-10-CM | POA: Diagnosis present

## 2020-08-30 DIAGNOSIS — S24154A Other incomplete lesion at T11-T12 level of thoracic spinal cord, initial encounter: Secondary | ICD-10-CM | POA: Diagnosis present

## 2020-08-30 DIAGNOSIS — E1169 Type 2 diabetes mellitus with other specified complication: Secondary | ICD-10-CM | POA: Diagnosis present

## 2020-08-30 DIAGNOSIS — Y92238 Other place in hospital as the place of occurrence of the external cause: Secondary | ICD-10-CM | POA: Diagnosis present

## 2020-08-30 DIAGNOSIS — I129 Hypertensive chronic kidney disease with stage 1 through stage 4 chronic kidney disease, or unspecified chronic kidney disease: Secondary | ICD-10-CM | POA: Diagnosis not present

## 2020-08-30 DIAGNOSIS — M40204 Unspecified kyphosis, thoracic region: Secondary | ICD-10-CM | POA: Diagnosis present

## 2020-08-30 DIAGNOSIS — K5903 Drug induced constipation: Secondary | ICD-10-CM | POA: Diagnosis present

## 2020-08-30 DIAGNOSIS — N184 Chronic kidney disease, stage 4 (severe): Secondary | ICD-10-CM | POA: Diagnosis present

## 2020-08-30 DIAGNOSIS — E875 Hyperkalemia: Secondary | ICD-10-CM | POA: Diagnosis present

## 2020-08-30 DIAGNOSIS — N136 Pyonephrosis: Secondary | ICD-10-CM | POA: Diagnosis present

## 2020-08-30 DIAGNOSIS — F4321 Adjustment disorder with depressed mood: Secondary | ICD-10-CM | POA: Diagnosis present

## 2020-08-30 DIAGNOSIS — M4640 Discitis, unspecified, site unspecified: Secondary | ICD-10-CM | POA: Diagnosis not present

## 2020-08-30 DIAGNOSIS — G9520 Unspecified cord compression: Secondary | ICD-10-CM | POA: Diagnosis not present

## 2020-08-30 DIAGNOSIS — Z8616 Personal history of COVID-19: Secondary | ICD-10-CM | POA: Diagnosis not present

## 2020-08-30 DIAGNOSIS — X500XXA Overexertion from strenuous movement or load, initial encounter: Secondary | ICD-10-CM | POA: Diagnosis not present

## 2020-08-30 DIAGNOSIS — Y9389 Activity, other specified: Secondary | ICD-10-CM | POA: Diagnosis not present

## 2020-08-30 DIAGNOSIS — Z20822 Contact with and (suspected) exposure to covid-19: Secondary | ICD-10-CM | POA: Diagnosis present

## 2020-08-30 DIAGNOSIS — E1122 Type 2 diabetes mellitus with diabetic chronic kidney disease: Secondary | ICD-10-CM | POA: Diagnosis present

## 2020-08-30 DIAGNOSIS — Z89511 Acquired absence of right leg below knee: Secondary | ICD-10-CM | POA: Diagnosis not present

## 2020-08-30 DIAGNOSIS — M4624 Osteomyelitis of vertebra, thoracic region: Secondary | ICD-10-CM | POA: Diagnosis present

## 2020-08-30 DIAGNOSIS — T402X5A Adverse effect of other opioids, initial encounter: Secondary | ICD-10-CM | POA: Diagnosis not present

## 2020-08-30 DIAGNOSIS — M1A9XX1 Chronic gout, unspecified, with tophus (tophi): Secondary | ICD-10-CM | POA: Diagnosis present

## 2020-08-30 DIAGNOSIS — F1120 Opioid dependence, uncomplicated: Secondary | ICD-10-CM | POA: Diagnosis present

## 2020-08-30 DIAGNOSIS — S24103A Unspecified injury at T7-T10 level of thoracic spinal cord, initial encounter: Secondary | ICD-10-CM | POA: Diagnosis not present

## 2020-08-30 DIAGNOSIS — R140 Abdominal distension (gaseous): Secondary | ICD-10-CM | POA: Diagnosis not present

## 2020-08-30 DIAGNOSIS — R1013 Epigastric pain: Secondary | ICD-10-CM | POA: Diagnosis not present

## 2020-08-30 LAB — CBG MONITORING, ED
Glucose-Capillary: 100 mg/dL — ABNORMAL HIGH (ref 70–99)
Glucose-Capillary: 181 mg/dL — ABNORMAL HIGH (ref 70–99)
Glucose-Capillary: 148 mg/dL — ABNORMAL HIGH (ref 70–99)

## 2020-08-30 LAB — URINALYSIS, ROUTINE W REFLEX MICROSCOPIC
Bilirubin Urine: NEGATIVE
Glucose, UA: NEGATIVE mg/dL
Hgb urine dipstick: NEGATIVE
Ketones, ur: NEGATIVE mg/dL
Nitrite: NEGATIVE
Protein, ur: 100 mg/dL — AB
Specific Gravity, Urine: 1.012 (ref 1.005–1.030)
WBC, UA: >50 WBC/hpf — ABNORMAL HIGH (ref 0–5)
pH: 6 (ref 5.0–8.0)

## 2020-08-30 LAB — BASIC METABOLIC PANEL
Anion gap: 10 (ref 5–15)
BUN: 23 mg/dL (ref 8–23)
CO2: 21 mmol/L — ABNORMAL LOW (ref 22–32)
Calcium: 8.4 mg/dL — ABNORMAL LOW (ref 8.9–10.3)
Chloride: 103 mmol/L (ref 98–111)
Creatinine, Ser: 1.83 mg/dL — ABNORMAL HIGH (ref 0.61–1.24)
GFR, Estimated: 40 mL/min — ABNORMAL LOW (ref >60)
Glucose, Bld: 120 mg/dL — ABNORMAL HIGH (ref 70–99)
Potassium: 4.6 mmol/L (ref 3.5–5.1)
Sodium: 134 mmol/L — ABNORMAL LOW (ref 135–145)

## 2020-08-30 LAB — CBC WITH DIFFERENTIAL/PLATELET
Abs Immature Granulocytes: 0.06 10*3/uL (ref 0.00–0.07)
Basophils Absolute: 0 10*3/uL (ref 0.0–0.1)
Basophils Relative: 0 %
Eosinophils Absolute: 0.3 10*3/uL (ref 0.0–0.5)
Eosinophils Relative: 4 %
HCT: 28.9 % — ABNORMAL LOW (ref 39.0–52.0)
Hemoglobin: 9.1 g/dL — ABNORMAL LOW (ref 13.0–17.0)
Immature Granulocytes: 1 %
Lymphocytes Relative: 20 %
Lymphs Abs: 1.4 10*3/uL (ref 0.7–4.0)
MCH: 30.7 pg (ref 26.0–34.0)
MCHC: 31.5 g/dL (ref 30.0–36.0)
MCV: 97.6 fL (ref 80.0–100.0)
Monocytes Absolute: 0.5 10*3/uL (ref 0.1–1.0)
Monocytes Relative: 7 %
Neutro Abs: 4.9 10*3/uL (ref 1.7–7.7)
Neutrophils Relative %: 68 %
Platelets: 255 10*3/uL (ref 150–400)
RBC: 2.96 MIL/uL — ABNORMAL LOW (ref 4.22–5.81)
RDW: 16.8 % — ABNORMAL HIGH (ref 11.5–15.5)
WBC: 7.1 10*3/uL (ref 4.0–10.5)
nRBC: 0 % (ref 0.0–0.2)

## 2020-08-30 LAB — SEDIMENTATION RATE: Sed Rate: 133 mm/hr — ABNORMAL HIGH (ref 0–16)

## 2020-08-30 LAB — HEMOGLOBIN A1C
Hgb A1c MFr Bld: 5.8 % — ABNORMAL HIGH (ref 4.8–5.6)
Mean Plasma Glucose: 119.76 mg/dL

## 2020-08-30 LAB — RESPIRATORY PANEL BY RT PCR (FLU A&B, COVID)
Influenza A by PCR: NEGATIVE
Influenza B by PCR: NEGATIVE
SARS Coronavirus 2 by RT PCR: POSITIVE — AB

## 2020-08-30 LAB — TSH: TSH: 4.604 u[IU]/mL — ABNORMAL HIGH (ref 0.350–4.500)

## 2020-08-30 LAB — IRON AND TIBC
Iron: 40 ug/dL — ABNORMAL LOW (ref 45–182)
Saturation Ratios: 21 % (ref 17.9–39.5)
TIBC: 192 ug/dL — ABNORMAL LOW (ref 250–450)
UIBC: 152 ug/dL

## 2020-08-30 LAB — FERRITIN: Ferritin: 136 ng/mL (ref 24–336)

## 2020-08-30 IMAGING — MR MR THORACIC SPINE W/O CM
4 of 5 series · 19 of 48 positions shown · non-contrast
Comparison: CT of the thoracic and lumbar spine from earlier today.
[DATE] MRI

CLINICAL DATA: Epidural abscess.

EXAM:
MRI THORACIC AND LUMBAR SPINE WITHOUT CONTRAST
TECHNIQUE: Multiplanar and multiecho pulse sequences of the thoracic and lumbar
spine were obtained without intravenous contrast.

[Series 19: T2 · sagittal · 3.0mm · 0.78mm/px · 8 of 25 slices shown (1 of 2)]
[im 1/25]
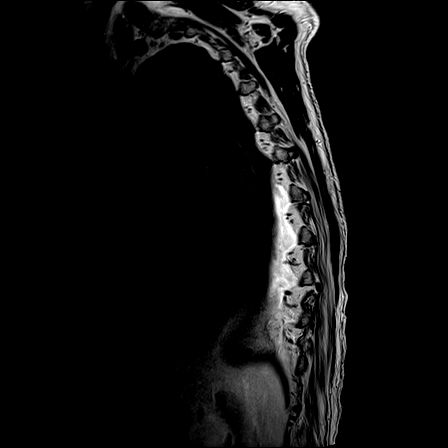
[im 4/25]
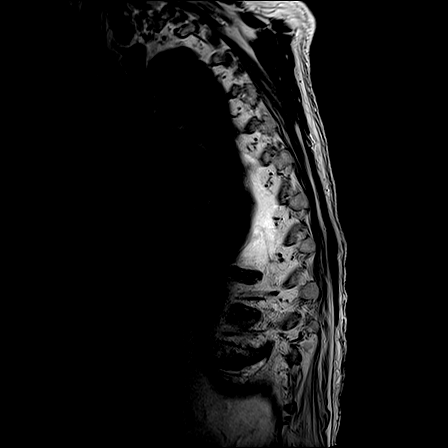
[im 7/25]
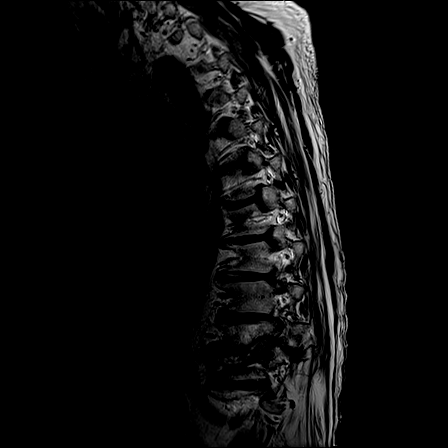
[im 11/25]
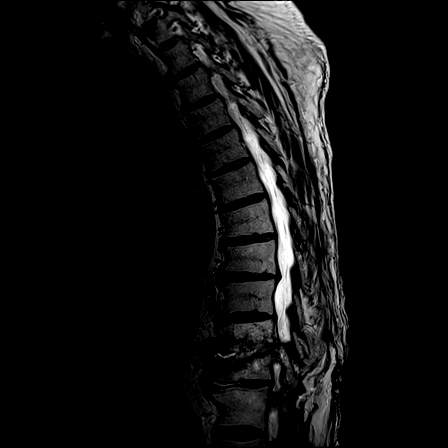
[im 14/25]
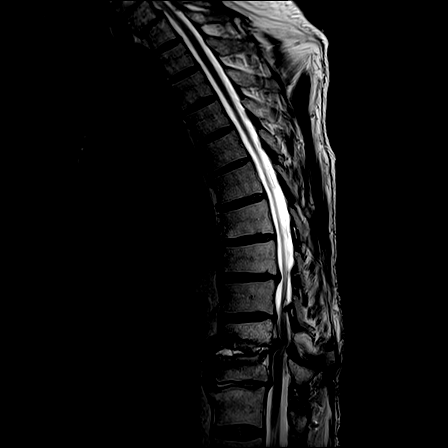
[im 18/25]
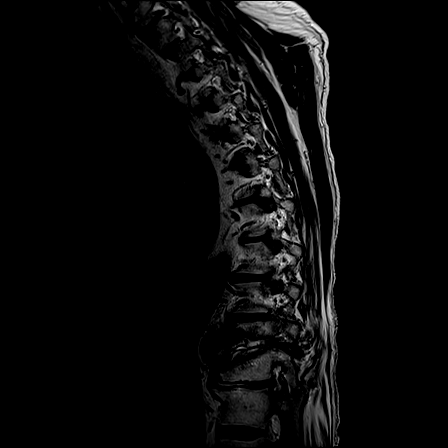
[im 21/25]
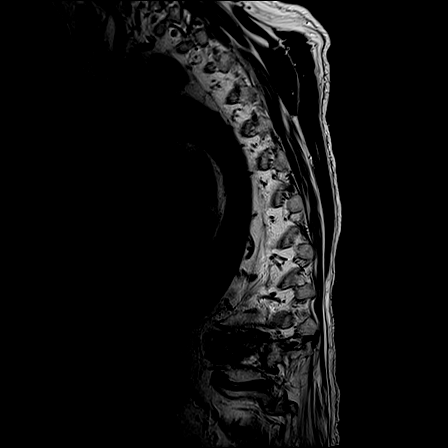
[im 25/25]
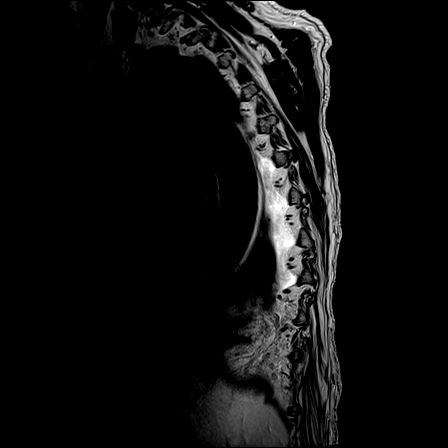

[Series 20: T1 · sagittal · 3.0mm · 0.78mm/px · 3 of 25 slices shown]
[im 4/25]
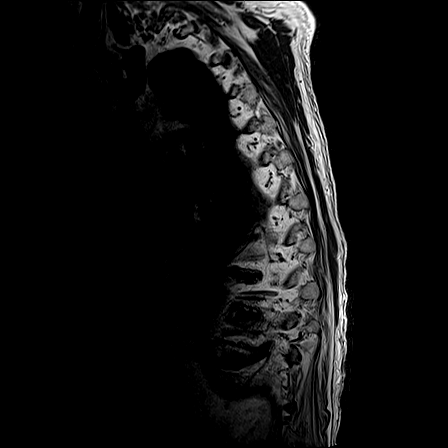
[im 14/25]
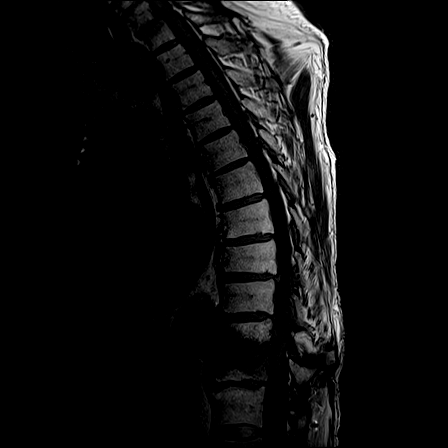
[im 21/25]
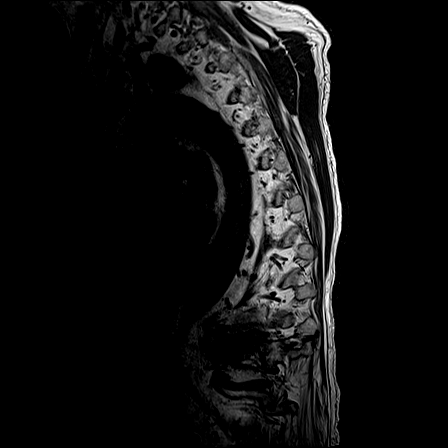

[Series 21: STIR · sagittal · 3.0mm · 0.39mm/px · 3 of 25 slices shown]
[im 4/25]
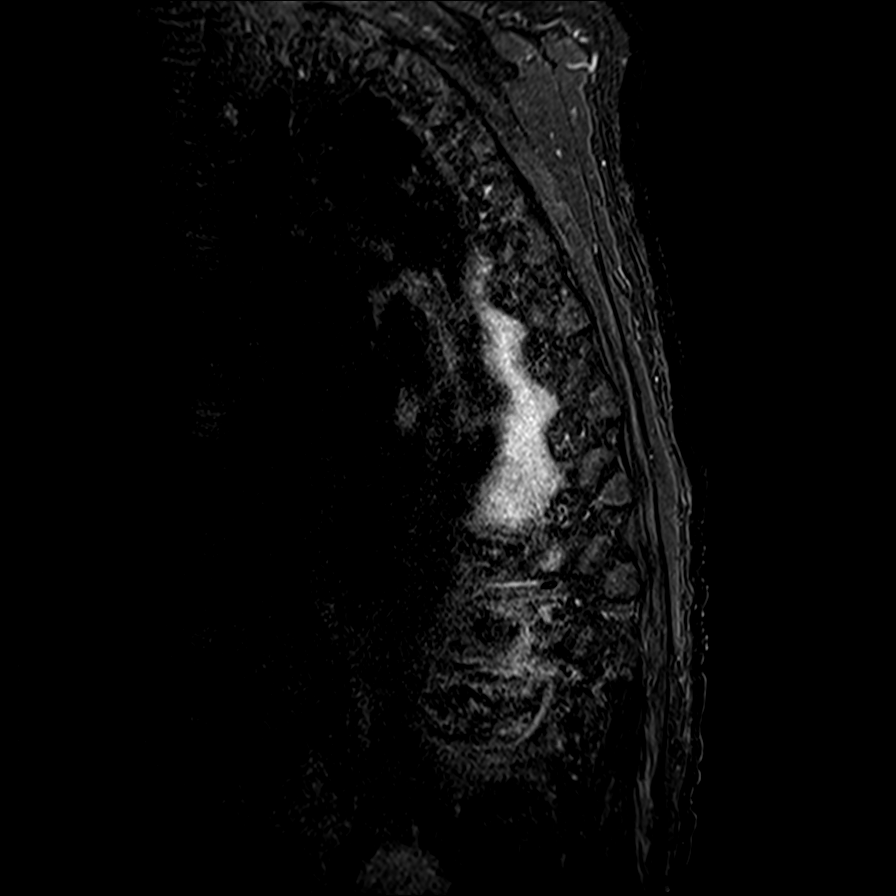
[im 14/25]
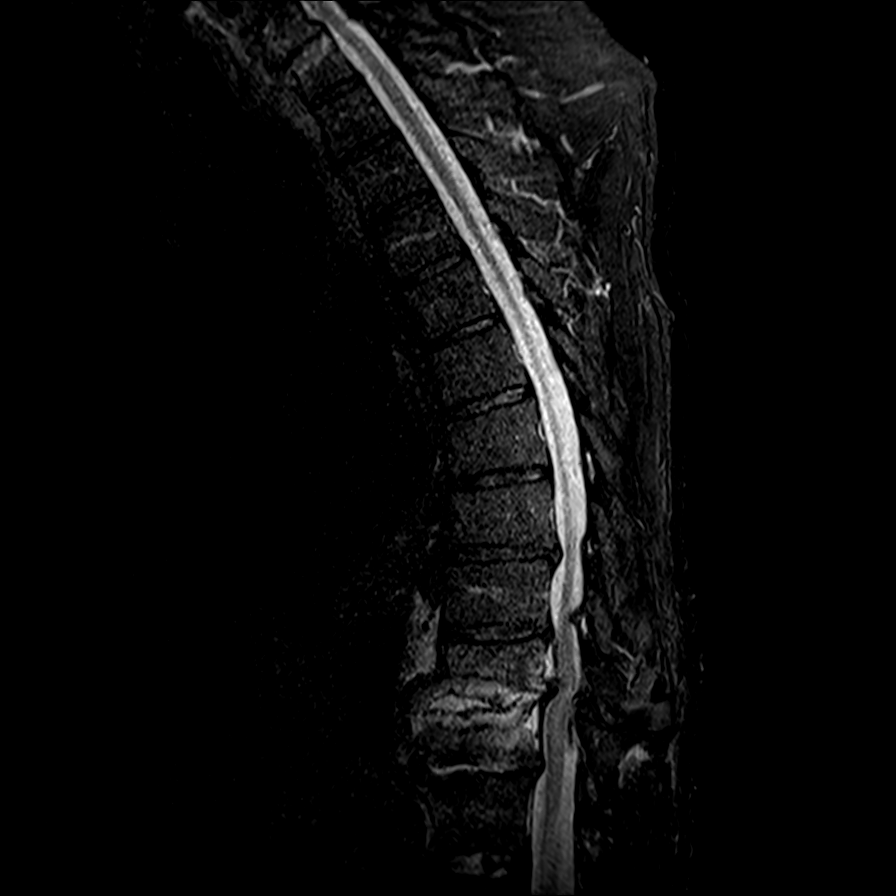
[im 21/25]
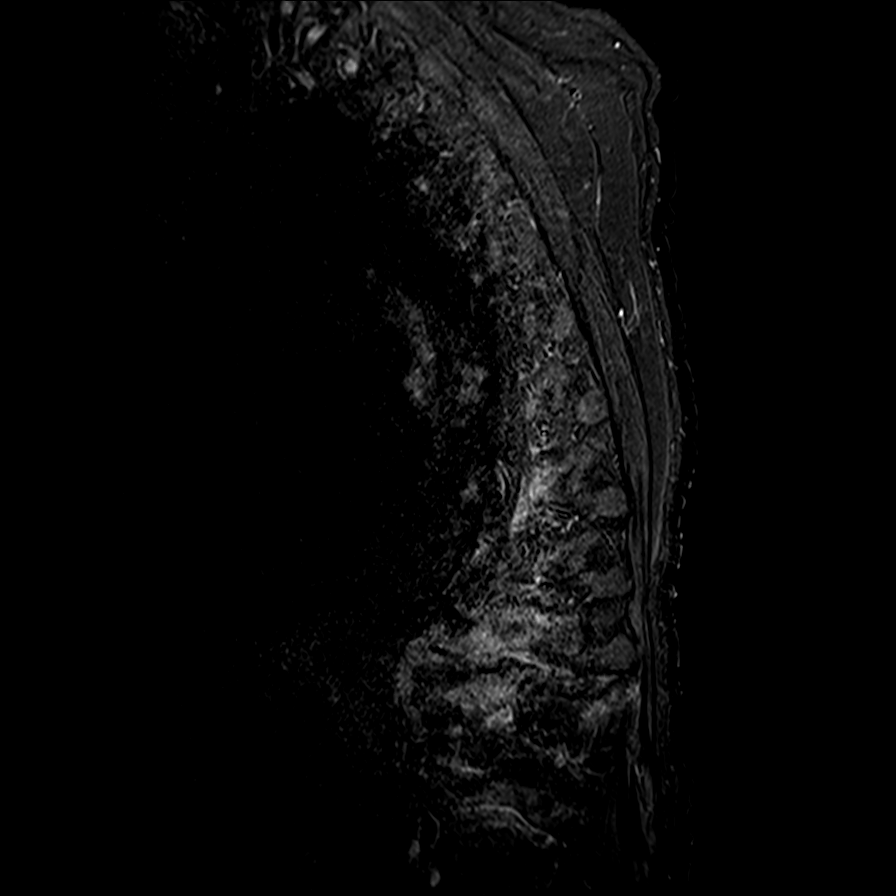

[Series 22: T2 · axial · 5.0mm · 0.59mm/px · z∈[-354,-90]mm · 5 of 39 slices shown (2 of 2)]
[im 1/39]
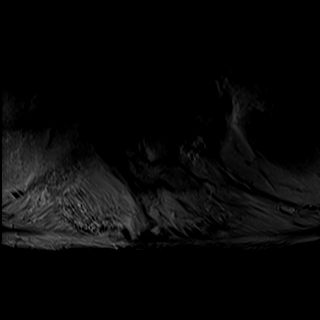
[im 7/39]
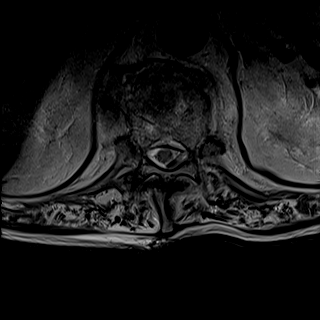
[im 11/39]
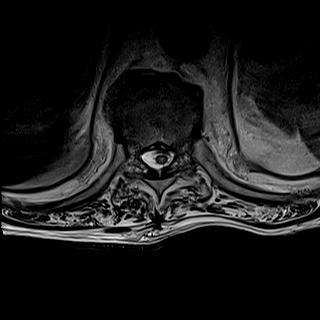
[im 21/39]
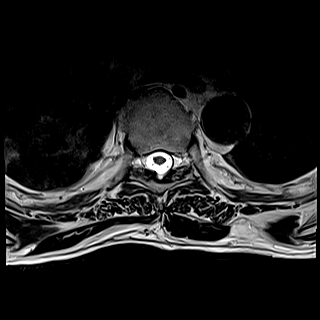
[im 35/39]
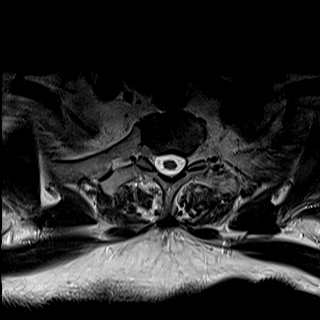

[19 of 48 positions shown; findings below may reference images not displayed]

FINDINGS: MRI THORACIC SPINE FINDINGS

Alignment:  Mild retrolisthesis at T10-11

Vertebrae: Endplate edema at T10-11 with endplate erosions. Fluid in
the disc space has partially collapsed since prior.

Cord: T10-11 spinal stenosis with cord compression and mild T2
hyperintensity.

Paraspinal and other soft tissues: Paravertebral edema at the level
of discitis. Reticular pulmonary opacity that is extensive. Right
hydronephrosis with cortical thinning.

Disc levels:

At T10-11 there is progressive disc space narrowing, ligamentum
flavum thickening, and endplate spurring. An upward pointing disc
protrusion is present. Progressive spinal stenosis with cord
deformity. Advanced biforaminal impingement.

Ordinary degenerative changes at the other levels for age. No other
levels of impingement seen.

Chronic T2 hyperintensity at the T11-12 disc space, without marrow
edema or interval progression.

MRI LUMBAR SPINE FINDINGS

Segmentation:  5 lumbar type vertebrae

Alignment:  Mild retrolisthesis at L1-2

Vertebrae: Posterior-lateral arthrodesis from T12-S1. No evidence of
acute fracture or discitis. Remote L1 compression fracture

Conus medullaris and cauda equina: Conus extends to the T12 level.
Conus and cauda equina appear normal.

Paraspinal and other soft tissues: Marked atrophy of intrinsic back
muscles. Right hydroureteronephrosis as reported on prior CT.

Disc levels:

No degenerative impingement.
IMPRESSION: MR THORACIC SPINE IMPRESSION

1. Chronic discitis/osteomyelitis at T10-11 with active but improved
inflammation and disc space fluid. Superimposed degenerative changes
have progressed from [DATE], with disc protrusion and
ligament thickening causing cord compression with cord edema.
Question instability at this level.
2. No definite epidural collection.  The patient declined contrast.

MR LUMBAR SPINE IMPRESSION

T12-S1 solid arthrodesis with no impingement.

## 2020-08-30 IMAGING — MR MR THORACIC SPINE W/O CM
1 series · 14 of 39 positions shown · IV contrast (MH)
Comparison: CT of the thoracic and lumbar spine from earlier today.
[DATE] MRI

CLINICAL DATA: Epidural abscess.

EXAM:
MRI THORACIC AND LUMBAR SPINE WITHOUT CONTRAST
TECHNIQUE: Multiplanar and multiecho pulse sequences of the thoracic and lumbar
spine were obtained without intravenous contrast.

[Series 1: T1 · axial · non-contrast · 5.0mm · 0.31mm/px · z∈[-354,-83]mm · 14 of 39 slices shown]
[im 1/39]
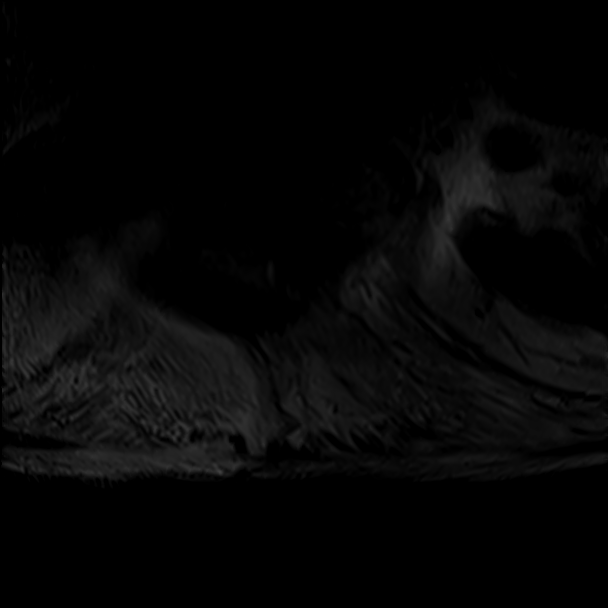
[im 2/39]
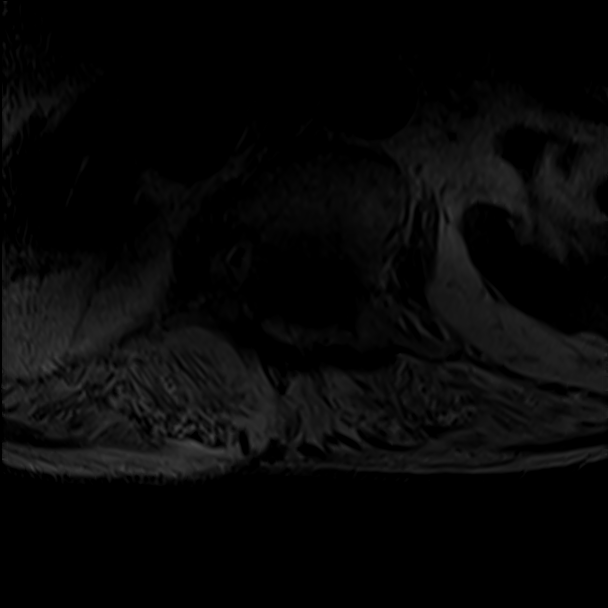
[im 3/39]
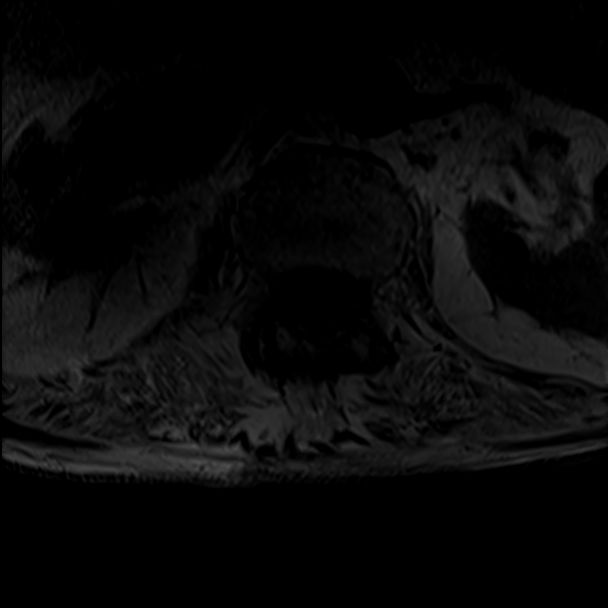
[im 4/39]
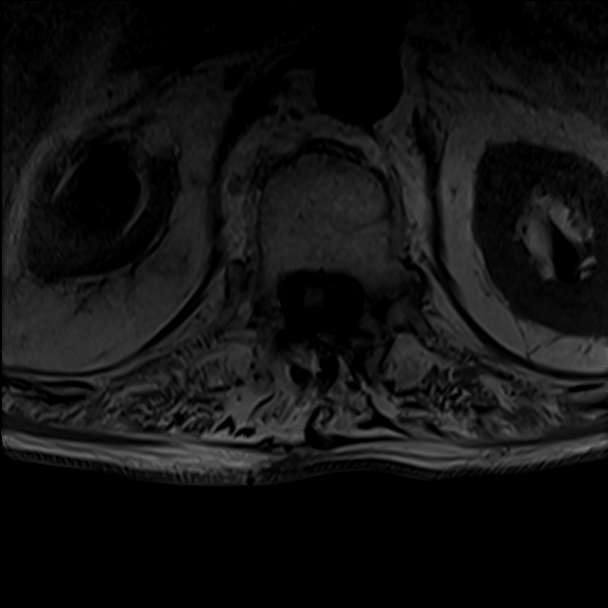
[im 7/39]
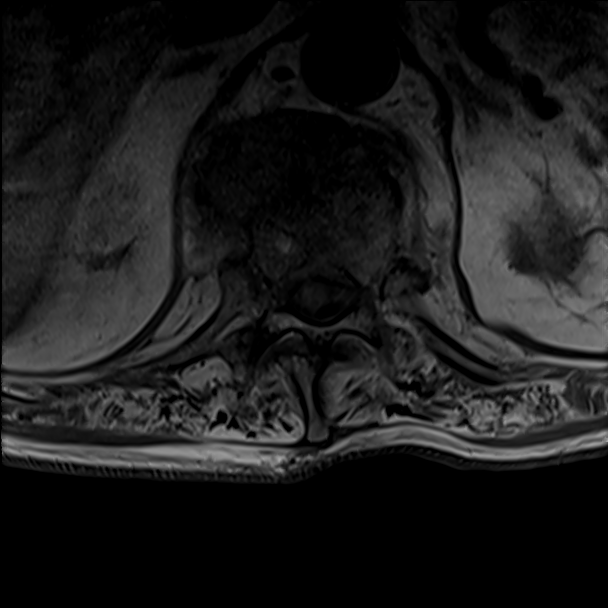
[im 8/39]
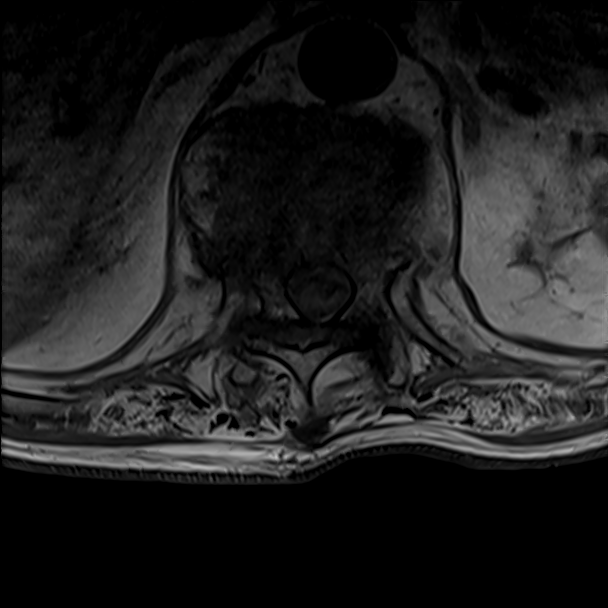
[im 13/39]
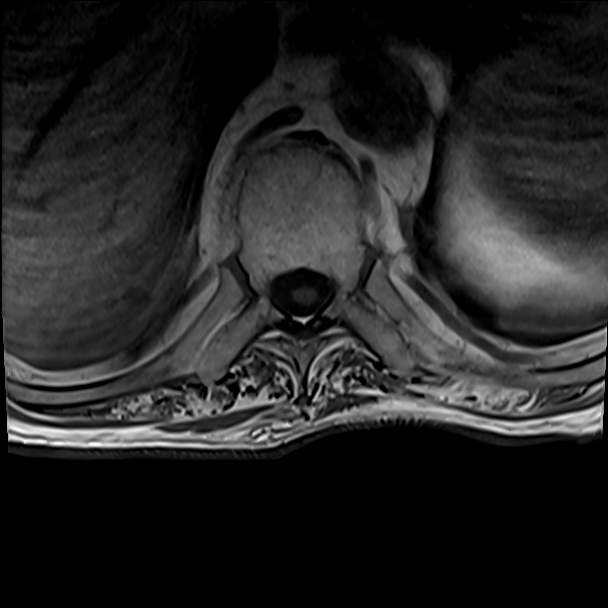
[im 18/39]
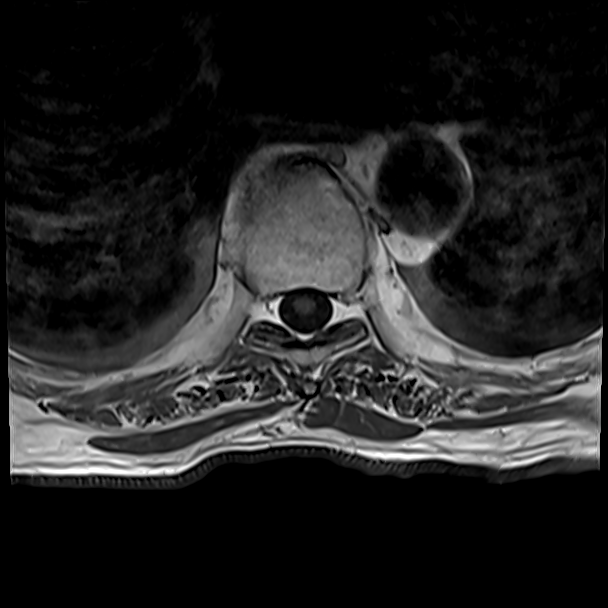
[im 20/39]
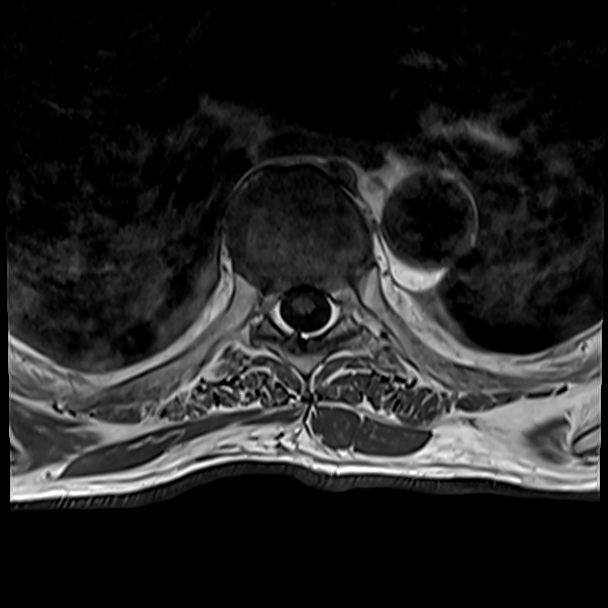
[im 22/39]
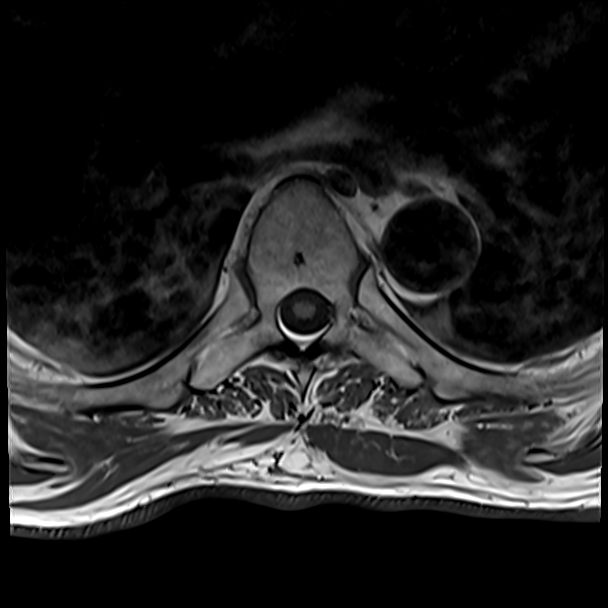
[im 27/39]
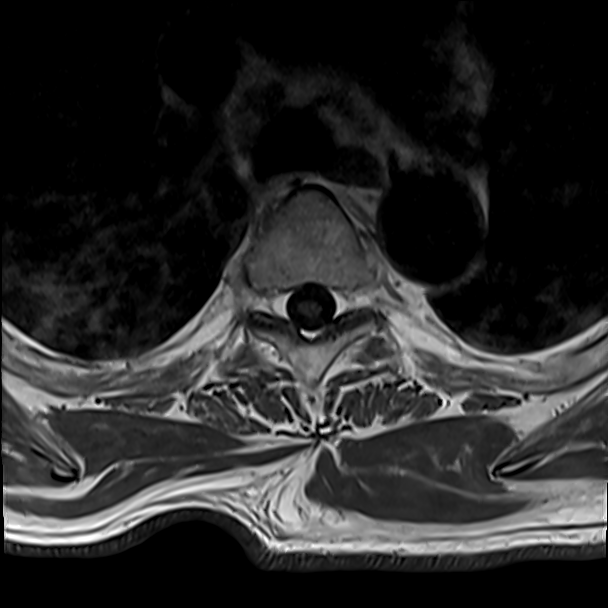
[im 32/39]
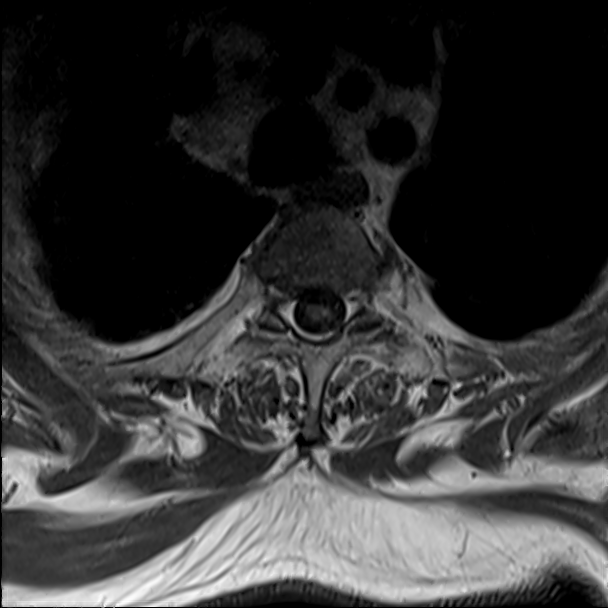
[im 33/39]
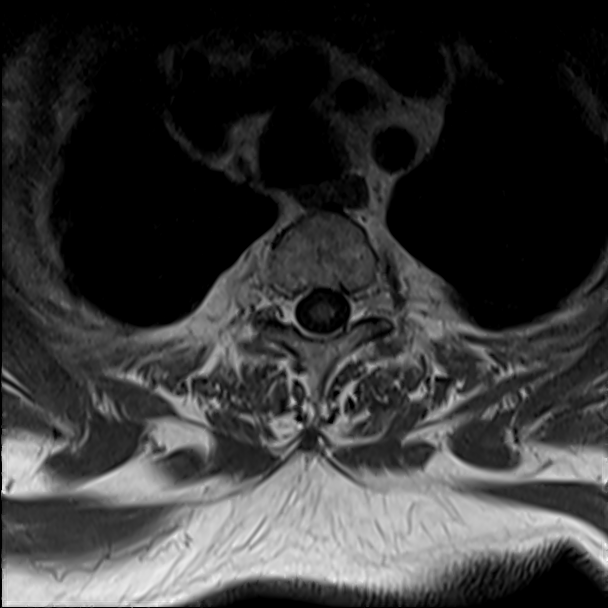
[im 37/39]
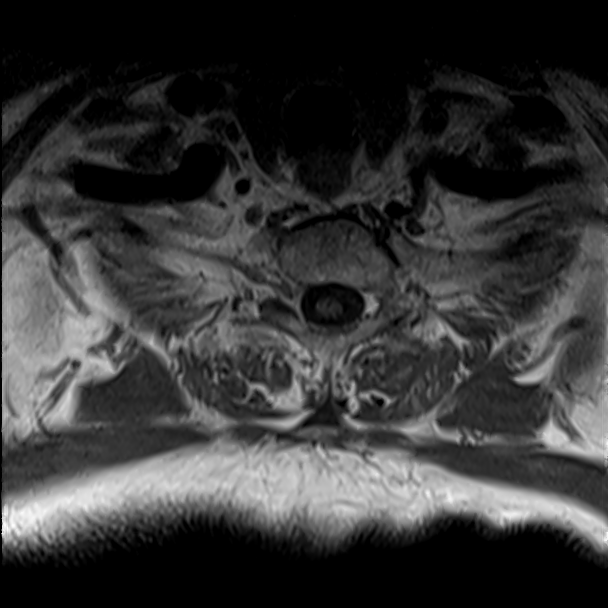

[14 of 39 positions shown; findings below may reference images not displayed]

FINDINGS: MRI THORACIC SPINE FINDINGS

Alignment:  Mild retrolisthesis at T10-11

Vertebrae: Endplate edema at T10-11 with endplate erosions. Fluid in
the disc space has partially collapsed since prior.

Cord: T10-11 spinal stenosis with cord compression and mild T2
hyperintensity.

Paraspinal and other soft tissues: Paravertebral edema at the level
of discitis. Reticular pulmonary opacity that is extensive. Right
hydronephrosis with cortical thinning.

Disc levels:

At T10-11 there is progressive disc space narrowing, ligamentum
flavum thickening, and endplate spurring. An upward pointing disc
protrusion is present. Progressive spinal stenosis with cord
deformity. Advanced biforaminal impingement.

Ordinary degenerative changes at the other levels for age. No other
levels of impingement seen.

Chronic T2 hyperintensity at the T11-12 disc space, without marrow
edema or interval progression.

MRI LUMBAR SPINE FINDINGS

Segmentation:  5 lumbar type vertebrae

Alignment:  Mild retrolisthesis at L1-2

Vertebrae: Posterior-lateral arthrodesis from T12-S1. No evidence of
acute fracture or discitis. Remote L1 compression fracture

Conus medullaris and cauda equina: Conus extends to the T12 level.
Conus and cauda equina appear normal.

Paraspinal and other soft tissues: Marked atrophy of intrinsic back
muscles. Right hydroureteronephrosis as reported on prior CT.

Disc levels:

No degenerative impingement.
IMPRESSION: MR THORACIC SPINE IMPRESSION

1. Chronic discitis/osteomyelitis at T10-11 with active but improved
inflammation and disc space fluid. Superimposed degenerative changes
have progressed from [DATE], with disc protrusion and
ligament thickening causing cord compression with cord edema.
Question instability at this level.
2. No definite epidural collection.  The patient declined contrast.

MR LUMBAR SPINE IMPRESSION

T12-S1 solid arthrodesis with no impingement.

## 2020-08-30 IMAGING — MR MR LUMBAR SPINE W/O CM
5 series · 38 of 48 positions shown · non-contrast
Comparison: CT of the thoracic and lumbar spine from earlier today.
[DATE] MRI

CLINICAL DATA: Epidural abscess.

EXAM:
MRI THORACIC AND LUMBAR SPINE WITHOUT CONTRAST
TECHNIQUE: Multiplanar and multiecho pulse sequences of the thoracic and lumbar
spine were obtained without intravenous contrast.

[Series 28: t2_tse_warp_sag · sagittal · 4.0mm · 0.81mm/px · 6 of 19 slices shown]
[im 1/19]
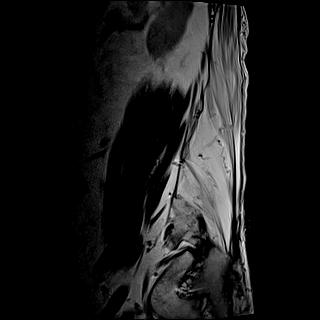
[im 4/19]
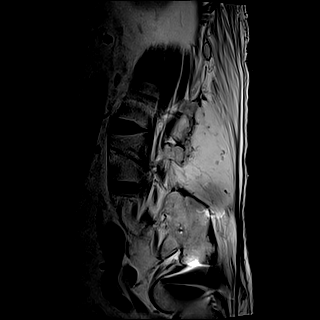
[im 8/19]
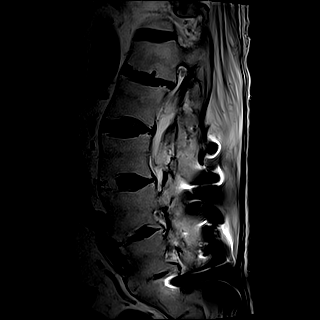
[im 11/19]
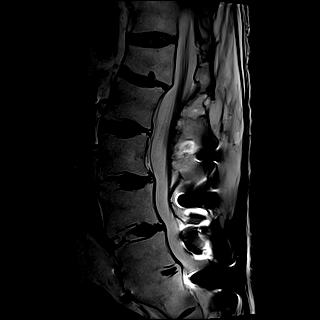
[im 15/19]
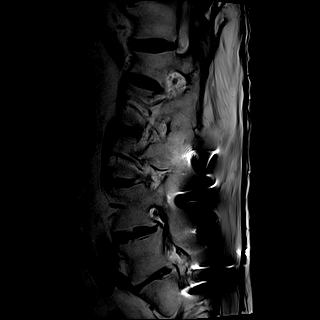
[im 19/19]
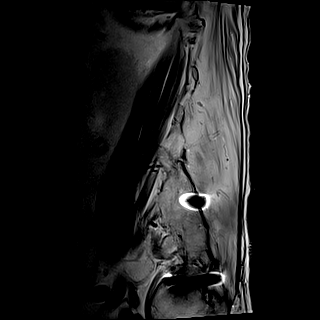

[Series 29: t2_tse_stir_warp_sag · sagittal · 4.0mm · 1.02mm/px · 6 of 18 slices shown]
[im 1/18]
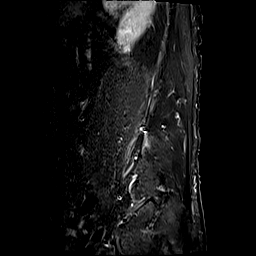
[im 4/18]
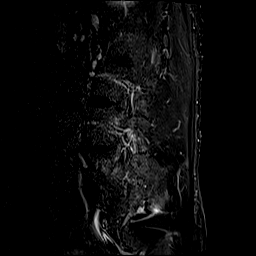
[im 7/18]
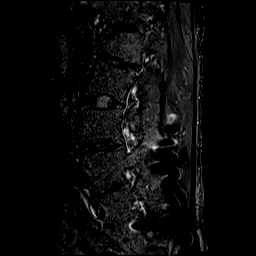
[im 11/18]
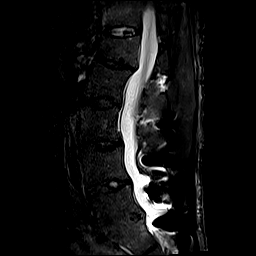
[im 14/18]
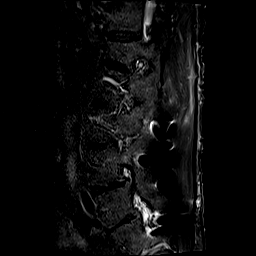
[im 18/18]
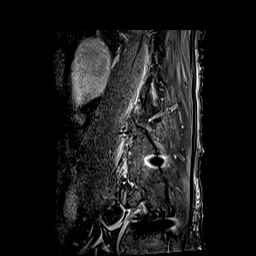

[Series 30: t1_tse_warp_sag · sagittal · 4.0mm · 0.81mm/px · 6 of 19 slices shown]
[im 1/19]
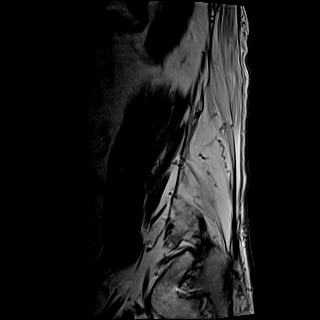
[im 4/19]
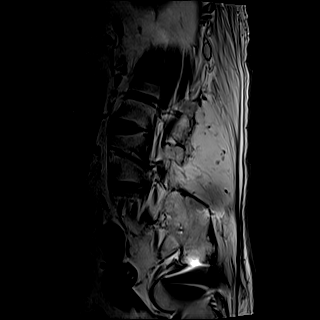
[im 8/19]
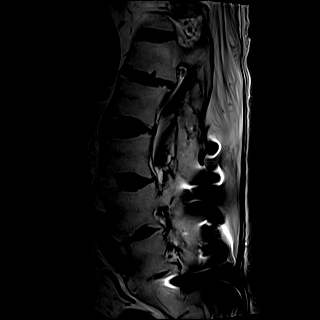
[im 11/19]
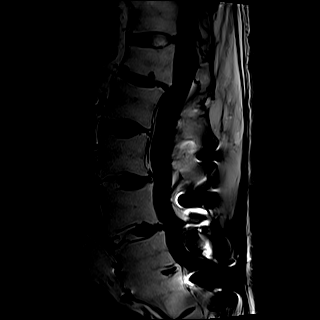
[im 15/19]
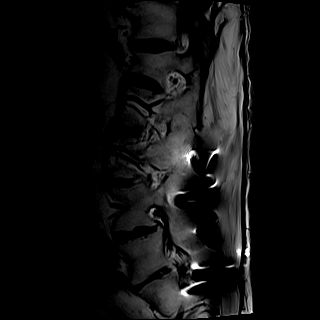
[im 19/19]
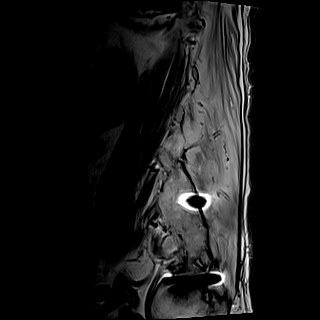

[Series 31: t2_tse_warp_tra · axial · 4.0mm · 0.78mm/px · z∈[-544,-335]mm · 12 of 46 slices shown]
[im 1/46]
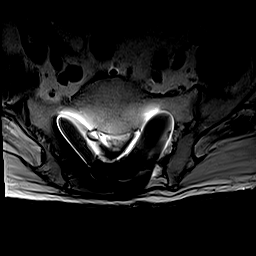
[im 4/46]
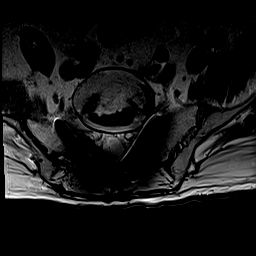
[im 7/46]
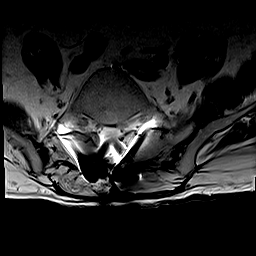
[im 10/46]
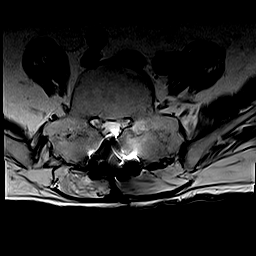
[im 13/46]
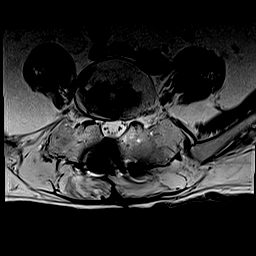
[im 17/46]
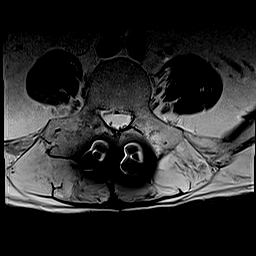
[im 20/46]
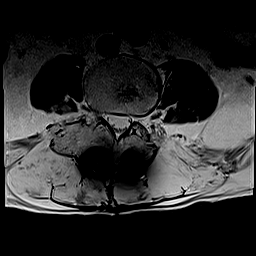
[im 23/46]
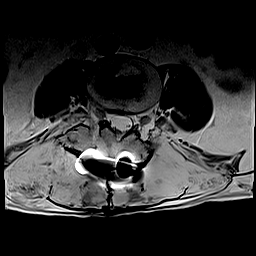
[im 26/46]
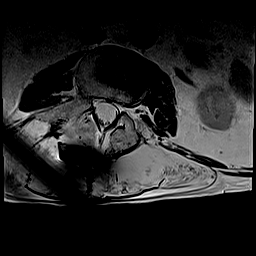
[im 33/46]
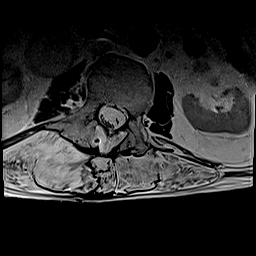
[im 39/46]
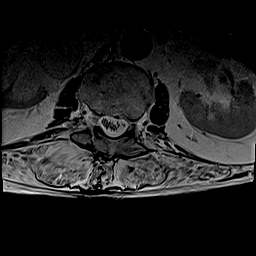
[im 46/46]
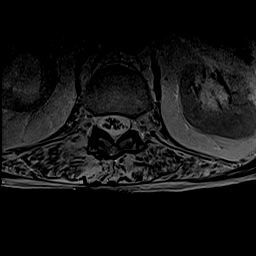

[Series 32: t1_tse_warp_tra · axial · 4.0mm · 0.39mm/px · z∈[-544,-335]mm · 8 of 46 slices shown]
[im 1/46]
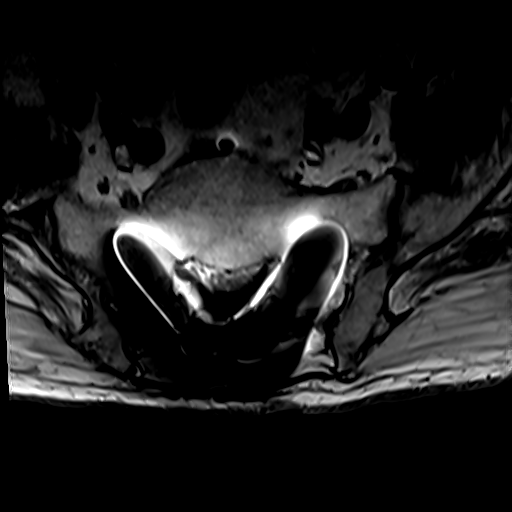
[im 7/46]
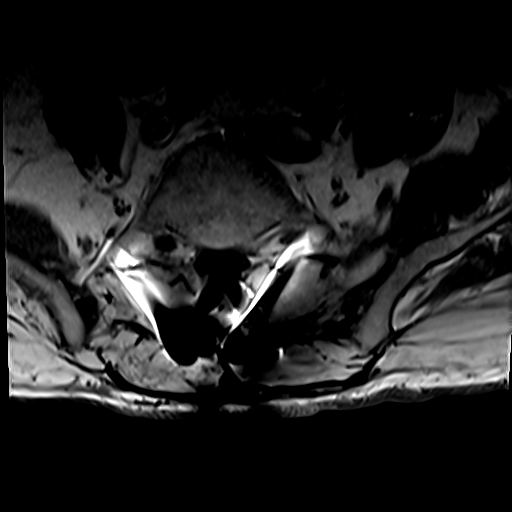
[im 13/46]
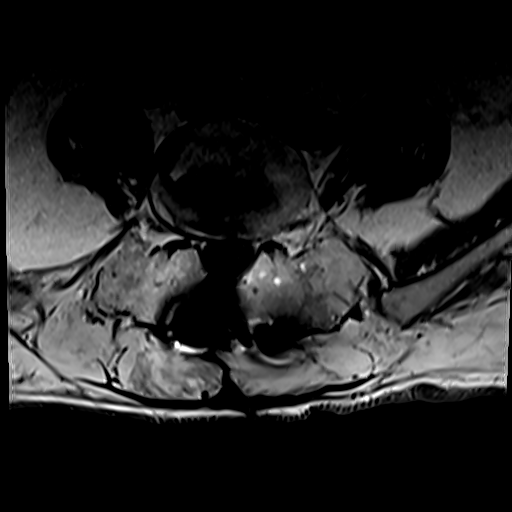
[im 20/46]
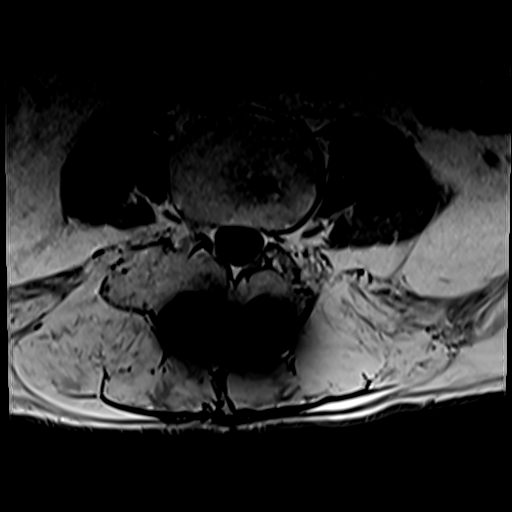
[im 26/46]
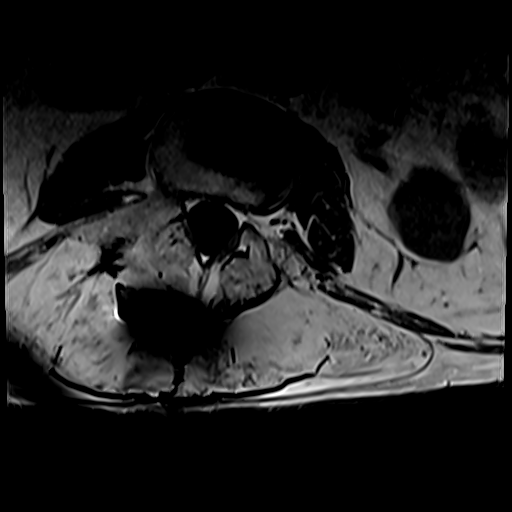
[im 33/46]
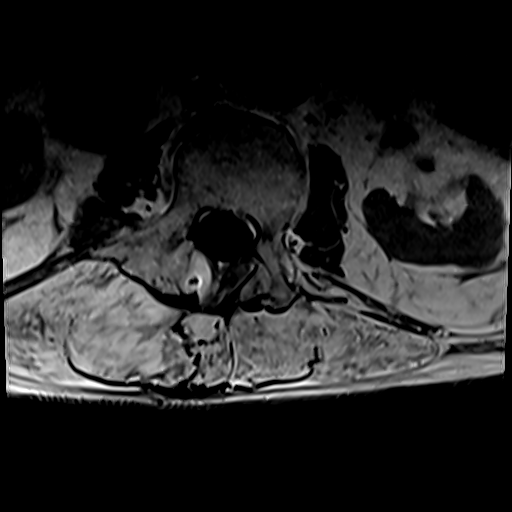
[im 39/46]
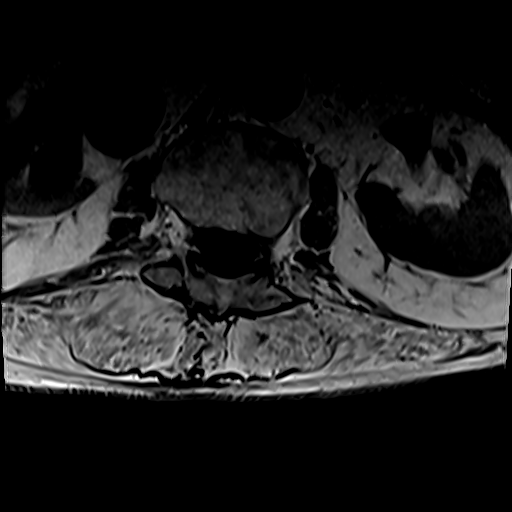
[im 46/46]
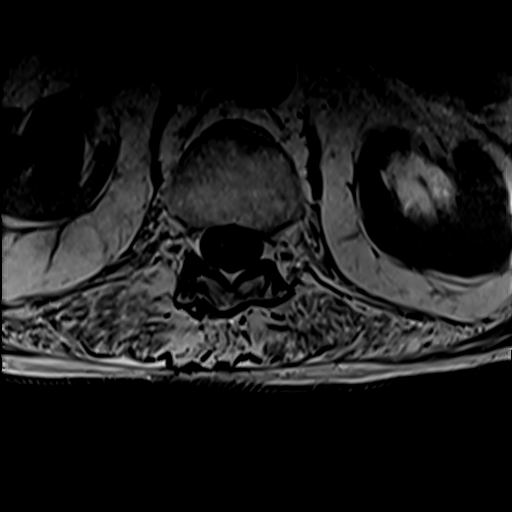

[38 of 48 positions shown; findings below may reference images not displayed]

FINDINGS: MRI THORACIC SPINE FINDINGS

Alignment:  Mild retrolisthesis at T10-11

Vertebrae: Endplate edema at T10-11 with endplate erosions. Fluid in
the disc space has partially collapsed since prior.

Cord: T10-11 spinal stenosis with cord compression and mild T2
hyperintensity.

Paraspinal and other soft tissues: Paravertebral edema at the level
of discitis. Reticular pulmonary opacity that is extensive. Right
hydronephrosis with cortical thinning.

Disc levels:

At T10-11 there is progressive disc space narrowing, ligamentum
flavum thickening, and endplate spurring. An upward pointing disc
protrusion is present. Progressive spinal stenosis with cord
deformity. Advanced biforaminal impingement.

Ordinary degenerative changes at the other levels for age. No other
levels of impingement seen.

Chronic T2 hyperintensity at the T11-12 disc space, without marrow
edema or interval progression.

MRI LUMBAR SPINE FINDINGS

Segmentation:  5 lumbar type vertebrae

Alignment:  Mild retrolisthesis at L1-2

Vertebrae: Posterior-lateral arthrodesis from T12-S1. No evidence of
acute fracture or discitis. Remote L1 compression fracture

Conus medullaris and cauda equina: Conus extends to the T12 level.
Conus and cauda equina appear normal.

Paraspinal and other soft tissues: Marked atrophy of intrinsic back
muscles. Right hydroureteronephrosis as reported on prior CT.

Disc levels:

No degenerative impingement.
IMPRESSION: MR THORACIC SPINE IMPRESSION

1. Chronic discitis/osteomyelitis at T10-11 with active but improved
inflammation and disc space fluid. Superimposed degenerative changes
have progressed from [DATE], with disc protrusion and
ligament thickening causing cord compression with cord edema.
Question instability at this level.
2. No definite epidural collection.  The patient declined contrast.

MR LUMBAR SPINE IMPRESSION

T12-S1 solid arthrodesis with no impingement.

## 2020-08-30 MED ORDER — METHOCARBAMOL 500 MG PO TABS
750.0000 mg | ORAL_TABLET | Freq: Three times a day (TID) | ORAL | Status: DC | PRN
  Administered 2020-08-30 – 2020-09-02 (×4): 750 mg via ORAL
  Filled 2020-08-30 (×4): qty 2

## 2020-08-30 MED ORDER — INSULIN ASPART 100 UNIT/ML ~~LOC~~ SOLN
0.0000 [IU] | SUBCUTANEOUS | Status: DC
  Administered 2020-08-30 – 2020-09-03 (×8): 2 [IU] via SUBCUTANEOUS
  Administered 2020-09-04: 3 [IU] via SUBCUTANEOUS
  Administered 2020-09-04 – 2020-09-05 (×2): 2 [IU] via SUBCUTANEOUS
  Administered 2020-09-05 – 2020-09-07 (×3): 3 [IU] via SUBCUTANEOUS
  Administered 2020-09-07: 2 [IU] via SUBCUTANEOUS
  Administered 2020-09-07: 5 [IU] via SUBCUTANEOUS
  Administered 2020-09-07 – 2020-09-10 (×5): 2 [IU] via SUBCUTANEOUS

## 2020-08-30 MED ORDER — ATORVASTATIN CALCIUM 10 MG PO TABS
20.0000 mg | ORAL_TABLET | Freq: Every day | ORAL | Status: DC
  Administered 2020-08-31 – 2020-09-10 (×10): 20 mg via ORAL
  Filled 2020-08-30 (×10): qty 2

## 2020-08-30 MED ORDER — FINASTERIDE 5 MG PO TABS
5.0000 mg | ORAL_TABLET | Freq: Every day | ORAL | Status: DC
  Administered 2020-08-30 – 2020-10-03 (×34): 5 mg via ORAL
  Filled 2020-08-30 (×34): qty 1

## 2020-08-30 MED ORDER — OXYCODONE HCL ER 10 MG PO T12A: 30.0000 mg | EXTENDED_RELEASE_TABLET | Freq: Two times a day (BID) | ORAL | Status: DC

## 2020-08-30 MED ORDER — ONDANSETRON HCL 4 MG PO TABS
4.0000 mg | ORAL_TABLET | Freq: Four times a day (QID) | ORAL | Status: DC | PRN
  Administered 2020-09-15: 4 mg via ORAL
  Filled 2020-08-30: qty 1

## 2020-08-30 MED ORDER — GABAPENTIN 600 MG PO TABS
300.0000 mg | ORAL_TABLET | Freq: Two times a day (BID) | ORAL | Status: DC
  Filled 2020-08-30: qty 0.5

## 2020-08-30 MED ORDER — SENNOSIDES-DOCUSATE SODIUM 8.6-50 MG PO TABS
1.0000 | ORAL_TABLET | Freq: Two times a day (BID) | ORAL | Status: DC
  Administered 2020-08-30 – 2020-09-06 (×14): 1 via ORAL
  Filled 2020-08-30 (×14): qty 1

## 2020-08-30 MED ORDER — FUROSEMIDE 10 MG/ML IJ SOLN: 40.0000 mg | Freq: Once | INTRAMUSCULAR | Status: DC

## 2020-08-30 MED ORDER — ACETAMINOPHEN 325 MG PO TABS
650.0000 mg | ORAL_TABLET | Freq: Four times a day (QID) | ORAL | Status: DC | PRN
  Filled 2020-08-30: qty 2

## 2020-08-30 MED ORDER — SODIUM CHLORIDE 0.9 % IV SOLN
1.0000 g | Freq: Once | INTRAVENOUS | Status: AC
  Administered 2020-08-30: 1 g via INTRAVENOUS
  Filled 2020-08-30: qty 10

## 2020-08-30 MED ORDER — METOPROLOL TARTRATE 50 MG PO TABS
50.0000 mg | ORAL_TABLET | Freq: Two times a day (BID) | ORAL | Status: DC
  Administered 2020-08-31 – 2020-09-14 (×30): 50 mg via ORAL
  Filled 2020-08-30 (×5): qty 1
  Filled 2020-08-30: qty 4
  Filled 2020-08-30 (×24): qty 1

## 2020-08-30 MED ORDER — TAMSULOSIN HCL 0.4 MG PO CAPS
0.4000 mg | ORAL_CAPSULE | Freq: Every day | ORAL | Status: DC
  Administered 2020-08-30 – 2020-10-03 (×34): 0.4 mg via ORAL
  Filled 2020-08-30 (×35): qty 1

## 2020-08-30 MED ORDER — TIZANIDINE HCL 4 MG PO TABS
4.0000 mg | ORAL_TABLET | Freq: Once | ORAL | Status: AC
  Administered 2020-08-30: 4 mg via ORAL
  Filled 2020-08-30: qty 1

## 2020-08-30 MED ORDER — METOPROLOL TARTRATE 5 MG/5ML IV SOLN: 5.0000 mg | INTRAVENOUS | Status: DC | PRN

## 2020-08-30 MED ORDER — ALLOPURINOL 100 MG PO TABS
100.0000 mg | ORAL_TABLET | Freq: Every day | ORAL | Status: DC
  Administered 2020-08-31 – 2020-10-03 (×33): 100 mg via ORAL
  Filled 2020-08-30 (×33): qty 1

## 2020-08-30 MED ORDER — VITAMIN D (ERGOCALCIFEROL) 1.25 MG (50000 UNIT) PO CAPS
50000.0000 [IU] | ORAL_CAPSULE | ORAL | Status: DC
  Administered 2020-09-01 – 2020-09-15 (×3): 50000 [IU] via ORAL
  Filled 2020-08-30 (×3): qty 1

## 2020-08-30 MED ORDER — ONDANSETRON HCL 4 MG/2ML IJ SOLN
4.0000 mg | Freq: Four times a day (QID) | INTRAMUSCULAR | Status: DC | PRN
  Administered 2020-09-03 – 2020-09-23 (×8): 4 mg via INTRAVENOUS
  Filled 2020-08-30 (×8): qty 2

## 2020-08-30 MED ORDER — HYDROMORPHONE HCL 1 MG/ML IJ SOLN
0.5000 mg | INTRAMUSCULAR | Status: DC | PRN
  Administered 2020-08-30 – 2020-09-02 (×14): 0.5 mg via INTRAVENOUS
  Filled 2020-08-30 (×7): qty 0.5
  Filled 2020-08-30: qty 1
  Filled 2020-08-30: qty 0.5
  Filled 2020-08-30: qty 1
  Filled 2020-08-30: qty 0.5
  Filled 2020-08-30: qty 1
  Filled 2020-08-30 (×2): qty 0.5

## 2020-08-30 MED ORDER — GABAPENTIN 300 MG PO CAPS
300.0000 mg | ORAL_CAPSULE | Freq: Two times a day (BID) | ORAL | Status: DC
  Administered 2020-08-30 – 2020-09-10 (×23): 300 mg via ORAL
  Filled 2020-08-30 (×12): qty 1
  Filled 2020-08-30: qty 3
  Filled 2020-08-30 (×9): qty 1

## 2020-08-30 MED ORDER — HYDROMORPHONE HCL 1 MG/ML IJ SOLN
1.0000 mg | Freq: Once | INTRAMUSCULAR | Status: AC
  Administered 2020-08-30: 1 mg via INTRAVENOUS
  Filled 2020-08-30: qty 1

## 2020-08-30 MED ORDER — LEVOTHYROXINE SODIUM 50 MCG PO TABS
50.0000 ug | ORAL_TABLET | Freq: Every day | ORAL | Status: DC
  Administered 2020-08-30 – 2020-10-03 (×35): 50 ug via ORAL
  Filled 2020-08-30 (×35): qty 1

## 2020-08-30 MED ORDER — ALLOPURINOL 100 MG PO TABS: 100.0000 mg | ORAL_TABLET | Freq: Every day | ORAL | Status: DC

## 2020-08-30 NOTE — ED Notes (Signed)
Patient provided with snack and po fluids. Tolerated well. Awaiting inpatient bed room. Will continue to monitor.

## 2020-08-30 NOTE — ED Provider Notes (Signed)
Care assumed from Dr. Ralene Bathe at shift change. Patient presenting here with back pain, leg numbness, and a history of discitis and prior back surgery. Care signed out to me awaiting results of CT scans of the cervical, thoracic, and lumbar spines.  The studies have been completed and show no acute abnormality, but does show a massively distended urinary bladder. A Foley catheter was placed and just shy of 2 L of urine were obtained.  I have spoken with the hospitalist, Dr. Tonie Griffith who feels as though patient will require an emergent MRI and would like the patient transferred to [facility] as we did not have this capability at [facility] long at this hour of the night.  I have spoken with Dr. Leonides Schanz in the ED who agrees to accept as an ED-ED transfer.   Veryl Speak, MD 08/30/20 [facility] [place]

## 2020-08-30 NOTE — H&P (Addendum)
Date: 08/30/2020               Patient Name:  Thomas [name] MRN: [id number]  DOB: Nov 11, 1950 Age / Sex: 69 y.o., male   PCP: Antonietta Jewel, MD         Medical Service: Internal Medicine Teaching Service         Attending Physician: Dr. Evette Doffing, Mallie Mussel, *    First Contact: Dr. [name] Pager: [phone]  Second Contact: Dr. Harvie Heck Pager: [phone]       After Hours (After 5p/  First Contact Pager: [phone]  weekends / holidays): Second Contact Pager: [phone]   Chief Complaint: Back pain  History of Present Illness:  Mr.Mcclory is a 69 yo M w/ PMH of CVA, Gout, CKD, DM s/p R BKA, chronic back pain s/p multiple back surgeries, Prior hx of T-spine osteomyelitis / epidural abscess and recent COVID + status presenting to [facility] with back pain. He was in his usual state of health until last Thursday when he had acute onset 5 minute episode of slurred speech and blurry vision. He went to [facility] for evaluation and states he hit his back against the MRI bed while getting set up by the radiology techs. He heard a 'popping' noise at that time with severe back pain accompanied with left hand and leg numbness. He was not able to tolerate further imaging and he was discharged home as his ran out of SNF days per his insurance. EMS was unable to get him home as he was unable to ambulate and he lives alone. He was brought to [facility] for further evaluation and transferred to [facility] for MRI imaging.  On further questioning, he describes a prolonged history of readmission and hospital stays over the past year. He had always had back problems after a traumatic injury from falling off a tree in his 1s and shattering his spine requiring multiple surgeries but his recent exacerbation started September of last year when he presented with worsening back pain and was found to have osteomyelitis of his thoracic spine without obvious inciting etiology. He was  hospitalized at [facility] for 8 week course of antibiotics and discharged but returned to [facility] after a fall in following February and underwent another 8 week course of antibiotics. His osteomyelitis reoccurred again in June requiring him to be readmitted to [facility] and then he was discharged to [facility] rehab, which had a COVID breakout and had tested positive for COVID. He was admit to [facility] in October for COVID-19 and had a week long stay there as well.  Chart review shows records from [place] showing admission for left sided weakness on November 4th at [place]. CT head w/o acute findings. Patient unable to tolerate MRI. Diagnosed with TIA. Found to be appropriate for SNF discharge but unclear if he ever found placement.  On review of systems, he endorse subjective chills, abdominal pain, dysuria, urinary retention, and constipation. Denies any fevers, chest pain, palpitations, blurry vision, headaches, nausea or vomiting. Denies any saddle anesthesia, urinary / bowel incontinence.  Meds:  No outpatient medications have been marked as taking for the 08/29/20 encounter [facility] Encounter).   Allergies: Allergies as of 08/29/2020 - Review Complete 08/29/2020  Allergen Reaction Noted  . Other Other (See Comments) 01/30/2016  . Oxycodone Other (See Comments) 01/30/2016  . Oxymorphone Other (See Comments) 01/30/2016  . Aspirin Nausea And Vomiting 08/31/2019  . Beta vulgaris Nausea  And Vomiting 01/30/2016  . Buspirone Other (See Comments) 01/30/2016  . Cabbage Nausea And Vomiting 01/30/2016  . Codeine Other (See Comments) 10/18/2013  . Dolobid [diflunisal]  01/23/2020  . Fish allergy Nausea And Vomiting 01/30/2016  . Fish-derived products Nausea And Vomiting 01/30/2016  . Methadone  01/23/2020  . Pentazocine Other (See Comments) 01/30/2016  . Propoxyphene Other (See Comments) 01/30/2016  . Shellfish allergy Nausea And Vomiting 01/30/2016  . Sulfa antibiotics  Hives 10/18/2013  . Sulfasalazine Other (See Comments) 10/18/2013  . Vistaril [hydroxyzine]  01/23/2020  . Amoxicillin Rash 08/31/2019  . Apap [acetaminophen] Rash 11/24/2019   Past Medical History:  Diagnosis Date  . Arthritis   . BPH (benign prostatic hyperplasia) 11/24/2019  . Chronic pain syndrome 11/24/2019  . CKD (chronic kidney disease), stage IV ([place]) 11/24/2019  . Diabetes mellitus without complication ([place])   . Gout   . Hypertension   . Muscle pain   . Physical deconditioning 11/24/2019  . Scars   . Swelling    Family History:  Mother had early heart disease. Father passed from [place] History: Lives alone in [place]. Closest relative is brother that lives 18 miles away. Able to perform most ADLs, IADLS alone. Ambulates with prosthetic and cane. Denies any alcohol, tobacco, illicit substance use  Review of Systems: A complete ROS was negative except as per HPI.  Physical Exam: Blood pressure 139/85, pulse 66, temperature 98.5 F (36.9 C), temperature source Oral, resp. rate 16, SpO2 98 %.  Gen: Well-developed, chronically ill-appaering, NAD HEENT: NCAT head, hearing intact, EOMI, MMM, Absent dentition Neck: supple, ROM intact, no JVD CV: RRR, S1, S2 normal, No rubs, no murmurs, no gallops Pulm: CTAB, No rales, no wheezes Abd: Soft, Hypoactive bowel sounds, tenderness to palpation on suprapubic region Extm: Stable R BKA, Unable to sit up, Straight leg test + at 20 degrees Skin: Dry, Warm, normal turgor, multiple tophi on LUE and LLE  Neurologic exam: Mental status: A&Ox3 Cranial Nerves:             II: PERRL             III, IV, VI: Extra-occular motions intact bilaterally             V, VII: Face symmetric, sensation intact in all 3 divisions               VIII: hearing normal to rubbing fingers bilaterally               IX, X: palate rises symmetrically             XI: Head turn and shoulder shrug normal bilaterally               XII: tongue midline     Motor: Strength 3/5 on bilateral upper extremities, 2/5 on left lower extremity, bulk muscle and tone are normal  Deep Tendon Reflexes: diminished DTR on L patellar Gait: Unable to assess Sensory: Numbness to fine touch on LLE Coordination: There is no dysmetria on finger-to-nose. Rapid alternating movement test normal. 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 MRI showing progressive disc protrusion and cord compression at T10-T11 as well as improved chronic T10-11 osteomyelitis with disc space fluid. Physical exam concerning for multiple neurological deficits although not consistent with T10-T11. ESR elevated at 133 but CRP wnl. Also had recent COVID infection. No leukocytosis. Vitals stable. ED consulted neurosurgery. - Appreciate neurosurgery recs - F/u blood culture - Hold antibiotics in setting of possible OR / aspirate collection  - Keep NPO - Tylenol, Dilaudid PRN for breakthrough - Hold home oxy for now in setting of urinary retention/constipation - C/w home meds: gabapentin 345m  Acute urinary retention Bacteuria Constipation On admission noted to have urinary bladder distension with right hydroureteronephrosis[facility] Has hx of BPH on tamsulosin and finasteride at home. Also on chronic opioids that can cause retention. UTI caused by retention. Unclear if spinal injury contributing but would  expect incontinence rather than retention. Also endorsing constipation. UA w/ leuks, bacteuria. Received dose of ceftriaxone in ED. - Hold ceftriaxone in case of possible aspirate collection - F/u urine culture - C/w foley - Limit opioid use if possible - C/w home meds: finsateride 5128mdaily, tamsulosin 0.28m40maily  [facility] review. Finished 5 day course of remdesivir, decadron at 10/5. - Expect screening COVID test to be positive - No evidence of current infeciton. No need for isolation.  Chronic Kidney Disease stage 4 Baseline BUN 40, Creatinine 2.5. Noted to be improved (Bun 29, Creatinine 1.8) compared to previously documented renal fx. Possibly new baseline. Appear euvolemic on exam. - Trend renal fx - Avoid nephrotoxic meds when able  Diabetes Mellitus  On NPH-regular 70-30 and Tradjenta at home. Admit cbg 142 - Glucose checks - SSI while NPO  CVA Hx of PE Per patient had previous hx of CVAs. Currently not on antiplatelet therapy. Chart review shows previously on Xarelto on anti-coagulation but no longer taking. Also noted IVC filter on CT imaging.  - Monitor  Gout Multiple tophi noted on exam.  - C/w home med: allopurinol 100m32mily  DVT prophx: SCDs Diet: NPO Bowel: Miralax, senokot-docusate Code: Full  Prior to Admission Living Arrangement: Home Anticipated Discharge Location: SNF Barriers to Discharge: Medical tx  Dispo: Admit patient to Inpatient with expected length of stay greater than 2 midnights.  Signed: Keavon Sensing,Mosetta Anis 08/30/2020, 9:27 AM  Pager: [phone]

## 2020-08-30 NOTE — ED Notes (Signed)
Pt placed on hospital bed for more comfort, warm blankets provided.

## 2020-08-30 NOTE — ED Notes (Signed)
Dinner Tray Ordered @ 1747. 

## 2020-08-30 NOTE — Consult Note (Signed)
Providing Compassionate, Quality Care - Together  Neurosurgery Consult  Referring physician: Dr. Truman Hayward Reason for referral: Thoracic cord compression, T10-11  Chief Complaint: Back pain, lower extremity weakness/numbness  History of Present Illness: This is a 69 year old male with a history of CVA, CKD, gout, right BKA, osteodiscitis/epidural abscess and MRSA bacteremia status post multiple treatments of antibiotics, Covid that presents with acute on chronic mid back pain as well as lower extremity numbness and tingling.  He also complains of bowel and bladder difficulty, mainly urinary retention and constipation.  He states that last Friday he was being transferred at a different hospital to his MRI bed and felt a pop in his back.  Since then he has had numbness and tingling in his lower extremities as well as worsening weakness.   This is also the onset of his urinary and bowel issues. He denies groin numbness.  He has an extensive history of osteomyelitis, epidural abscess status post multiple treatments with antibiotics stemming from late last year.  Most recently he had a biopsy and treatment with antibiotics in February 2021 in which he completed his antibiotic therapy and April.  Per the notes he was on doxycycline at that time, for MRSA bacteremia and discitis (April 2021).  He also has a history of lumbar spinal surgery in the 1980s.  History reviewed, see above.  Medications: I have reviewed the patient's current medications. Allergies: No Known Allergies  History reviewed. No pertinent family history. Social History:  has no history on file for tobacco use, alcohol use, and drug use.  ROS: 14 point review of systems was obtained which all pertinent positives and negatives are listed HPI above.  Physical Exam:  Vital signs in last 24 hours: Temp:  [98 F (36.7 C)-98.3 F (36.8 C)] 98 F (36.7 C) (07/25 1814) Pulse Rate:  [58-128] 65 (07/26 0746) Resp:  [11-18] 14 (07/26  0217) BP: (138-182)/(65-125) 153/88 (07/26 0700) SpO2:  [91 %-98 %] 96 % (07/26 0746) PE: Awake alert oriented x3 Face symmetric, PERRLA EOMI Bilateral upper extremity 4+/5 motor strength Left lower extremity DF/PF 4/5, KE/HF 2/5 Right lower extremity BKA, KE/HF 3/5 Decrease sensation light touch in left greater than right lower extremity Bilateral patellar reflexes decreased, no clonus left lower extremity   Impression/Assessment:  69 year old male with  1.  T10/11 cord compression with cord signal change -Due to previous discitis/epidural abscess with erosion of T11 vertebral body and likely instability at this level  Plan:  -I had an extensive discussion with the patient about treatment options including percutaneous biopsy versus open surgical decompression and fusion given his recurrence of his infection and now instability and progression of stenosis on imaging. -He has not ambulated since July 2021 due to his debility and back pain and lower extremity weakness -Given the cord signal change and refractory treatment of his infection he will likely need surgical decompression, instrumentation and fusion, and disc aspiration for pathology/microbiology.  I discussed the risks and benefits of the surgery to the patient and he would like all treatment necessary.  Offered to discuss with any family or friends, he has a brother that he did not want me to discuss his care with. -Patient can eat at this time -We will work on scheduling surgery, I discussed with the primary team about medical clearance.  At this time he is medically cleared. -Hold all anticoagulation   Thank you for allowing me to participate in this patient's care.  Please do not hesitate to call with  questions or concerns.   Elwin Sleight, [facility] Cell: [phone]

## 2020-08-30 NOTE — ED Notes (Signed)
Patient remains in MRI 

## 2020-08-30 NOTE — Progress Notes (Signed)
Patient arrival to room [phone]. Patient in bed resting , bed in lowest position call bell within reach.

## 2020-08-30 NOTE — ED Notes (Signed)
Report called to charge nurse at [facility] ER. Transport request called to [employer] [facility] 

## 2020-08-30 NOTE — ED Provider Notes (Signed)
3:45 AM  Patient transferred from [facility].  Patient has history of previous back surgery.  Also history of discitis, bacteremia April 2021.  Not currently on antibiotics.  Here today with worsening back pain, left leg numbness, left hand numbness, inability to ambulate secondary to pain, urinary retention requiring Foley catheter.  Patient is a very poor historian.  Numbness may be chronic.  No obvious focal weakness but does have difficulty lifting the left leg but this appears to be due to pain.  Diminished reflexes bilaterally.  Hospitalist initially consulted but recommended emergent MRI prior to admission.  Patient sent here for emergent MRI.  He will need hospitalization for pain control, physical therapy.  T and L-spine MRIs with and without contrast ordered.  Will give Dilaudid for pain control.  Afebrile here.  Urine does appear infected.  Urine culture ordered.  Will hold on antibiotics until MRI is complete.  7:05 AM  Pt's MRI shows chronic discitis and osteomyelitis at T10-11 with active but improved inflammation into space fluid.  Superimposed degenerative changes have progressed with disc protrusion and ligament thickening causing cord compression with cord edema.  No obvious epidural collection but patient declined contrast.  States his last back surgery was with Dr. Bobbe Medico at [facility] in 1986.  He has had 6 previous back surgeries.  He has not followed up with a neurosurgeon in over 15 years.  Will discuss with unassigned neurosurgery on-call.  7:25 AM  Signed out to Dr. Ronnald Nian.  Patient has been updated with his results.  I reviewed all nursing notes and pertinent previous records as available.  I have reviewed and interpreted any EKGs, lab and urine results, imaging (as available).    Merie Wulf, Delice Bison, DO 08/30/20 [phone]

## 2020-08-31 ENCOUNTER — Inpatient Hospital Stay (HOSPITAL_COMMUNITY): Payer: Medicare Other

## 2020-08-31 DIAGNOSIS — G8921 Chronic pain due to trauma: Secondary | ICD-10-CM

## 2020-08-31 DIAGNOSIS — K5900 Constipation, unspecified: Secondary | ICD-10-CM

## 2020-08-31 DIAGNOSIS — R8271 Bacteriuria: Secondary | ICD-10-CM

## 2020-08-31 DIAGNOSIS — M546 Pain in thoracic spine: Secondary | ICD-10-CM

## 2020-08-31 DIAGNOSIS — S24103D Unspecified injury at T7-T10 level of thoracic spinal cord, subsequent encounter: Secondary | ICD-10-CM

## 2020-08-31 DIAGNOSIS — E1122 Type 2 diabetes mellitus with diabetic chronic kidney disease: Secondary | ICD-10-CM

## 2020-08-31 LAB — URINE CULTURE: Culture: NO GROWTH

## 2020-08-31 LAB — BASIC METABOLIC PANEL
Anion gap: 10 (ref 5–15)
BUN: 24 mg/dL — ABNORMAL HIGH (ref 8–23)
CO2: 22 mmol/L (ref 22–32)
Calcium: 8.8 mg/dL — ABNORMAL LOW (ref 8.9–10.3)
Chloride: 105 mmol/L (ref 98–111)
Creatinine, Ser: 1.77 mg/dL — ABNORMAL HIGH (ref 0.61–1.24)
GFR, Estimated: 42 mL/min — ABNORMAL LOW (ref >60)
Glucose, Bld: 104 mg/dL — ABNORMAL HIGH (ref 70–99)
Potassium: 4.6 mmol/L (ref 3.5–5.1)
Sodium: 137 mmol/L (ref 135–145)

## 2020-08-31 LAB — CBC
HCT: 28.7 % — ABNORMAL LOW (ref 39.0–52.0)
Hemoglobin: 9.2 g/dL — ABNORMAL LOW (ref 13.0–17.0)
MCH: 31.1 pg (ref 26.0–34.0)
MCHC: 32.1 g/dL (ref 30.0–36.0)
MCV: 97 fL (ref 80.0–100.0)
Platelets: 272 10*3/uL (ref 150–400)
RBC: 2.96 MIL/uL — ABNORMAL LOW (ref 4.22–5.81)
RDW: 17 % — ABNORMAL HIGH (ref 11.5–15.5)
WBC: 7.5 10*3/uL (ref 4.0–10.5)
nRBC: 0 % (ref 0.0–0.2)

## 2020-08-31 LAB — GLUCOSE, CAPILLARY
Glucose-Capillary: 101 mg/dL — ABNORMAL HIGH (ref 70–99)
Glucose-Capillary: 102 mg/dL — ABNORMAL HIGH (ref 70–99)
Glucose-Capillary: 127 mg/dL — ABNORMAL HIGH (ref 70–99)
Glucose-Capillary: 132 mg/dL — ABNORMAL HIGH (ref 70–99)
Glucose-Capillary: 141 mg/dL — ABNORMAL HIGH (ref 70–99)
Glucose-Capillary: 149 mg/dL — ABNORMAL HIGH (ref 70–99)

## 2020-08-31 IMAGING — DX DG CHEST 1V PORT
1 series · 2 of 2 positions shown · non-contrast
Comparison: [DATE]

CLINICAL DATA: Cough

EXAM:
PORTABLE CHEST 1 VIEW

[Series 1: chest ap · 0.14mm/px · 2 of 2 slices shown]
[im 1/2]
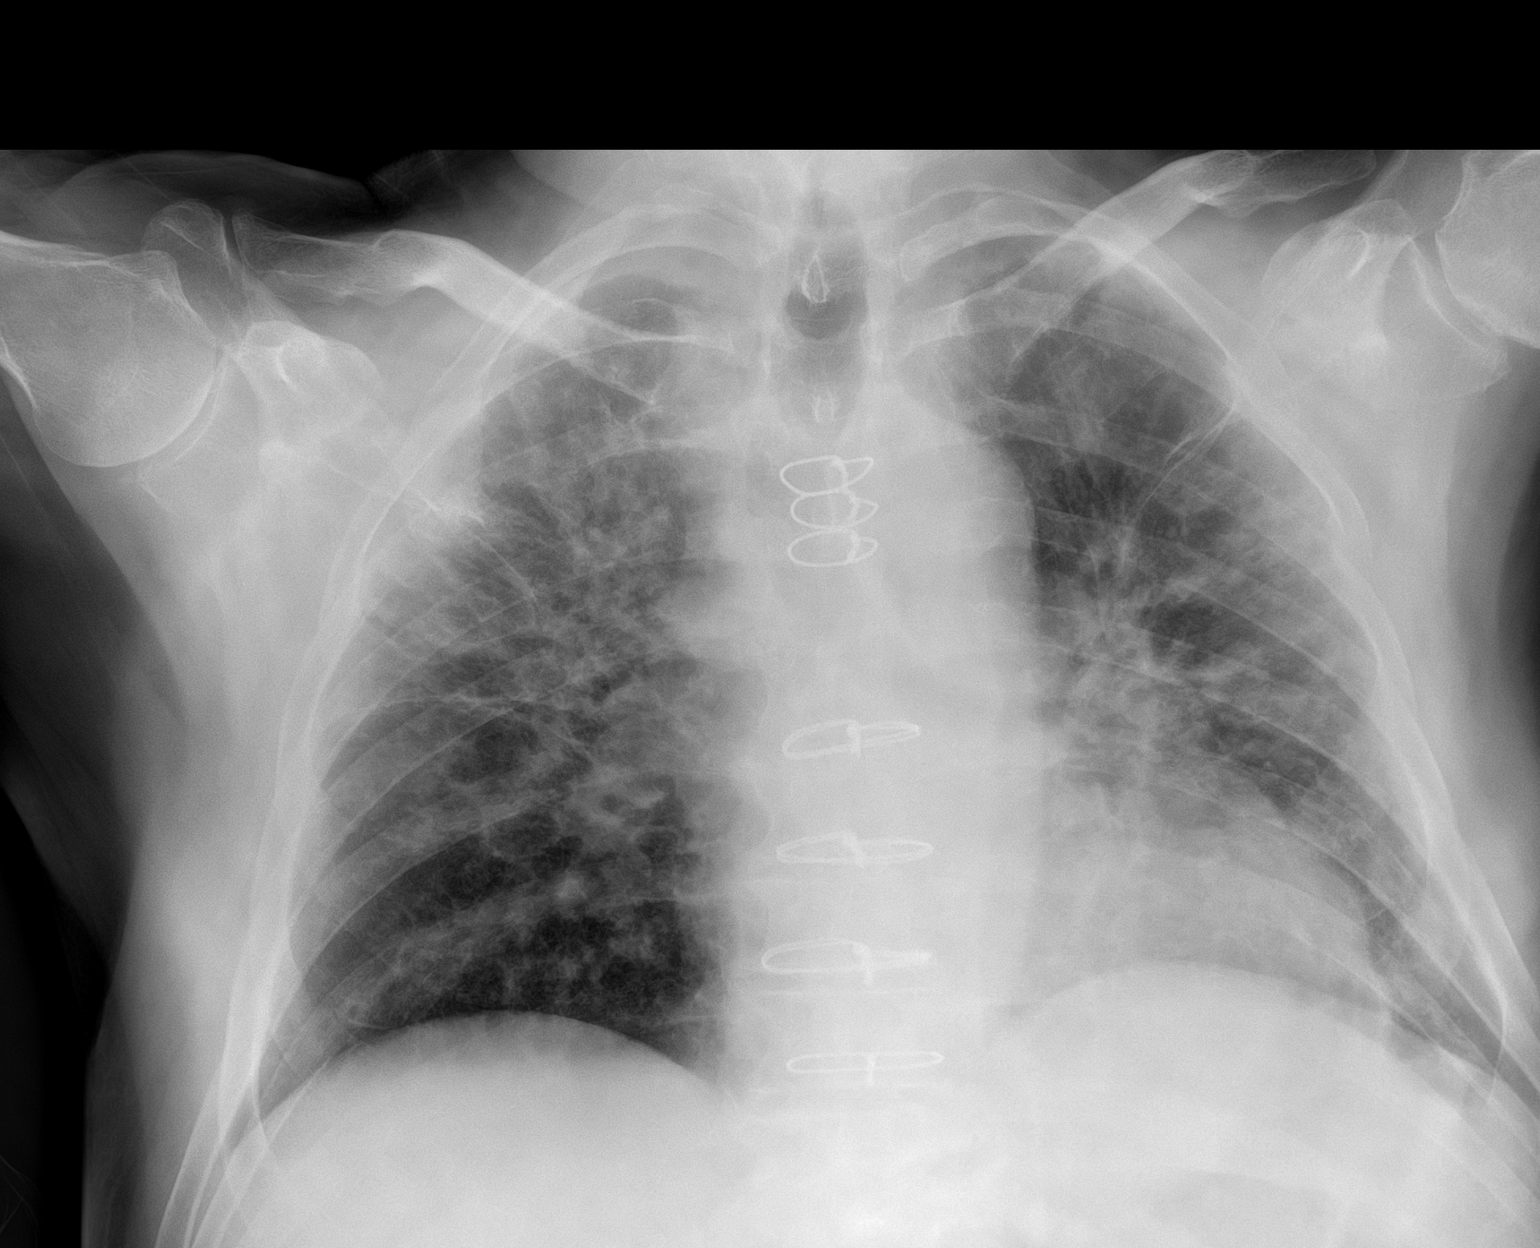
[im 2/2]
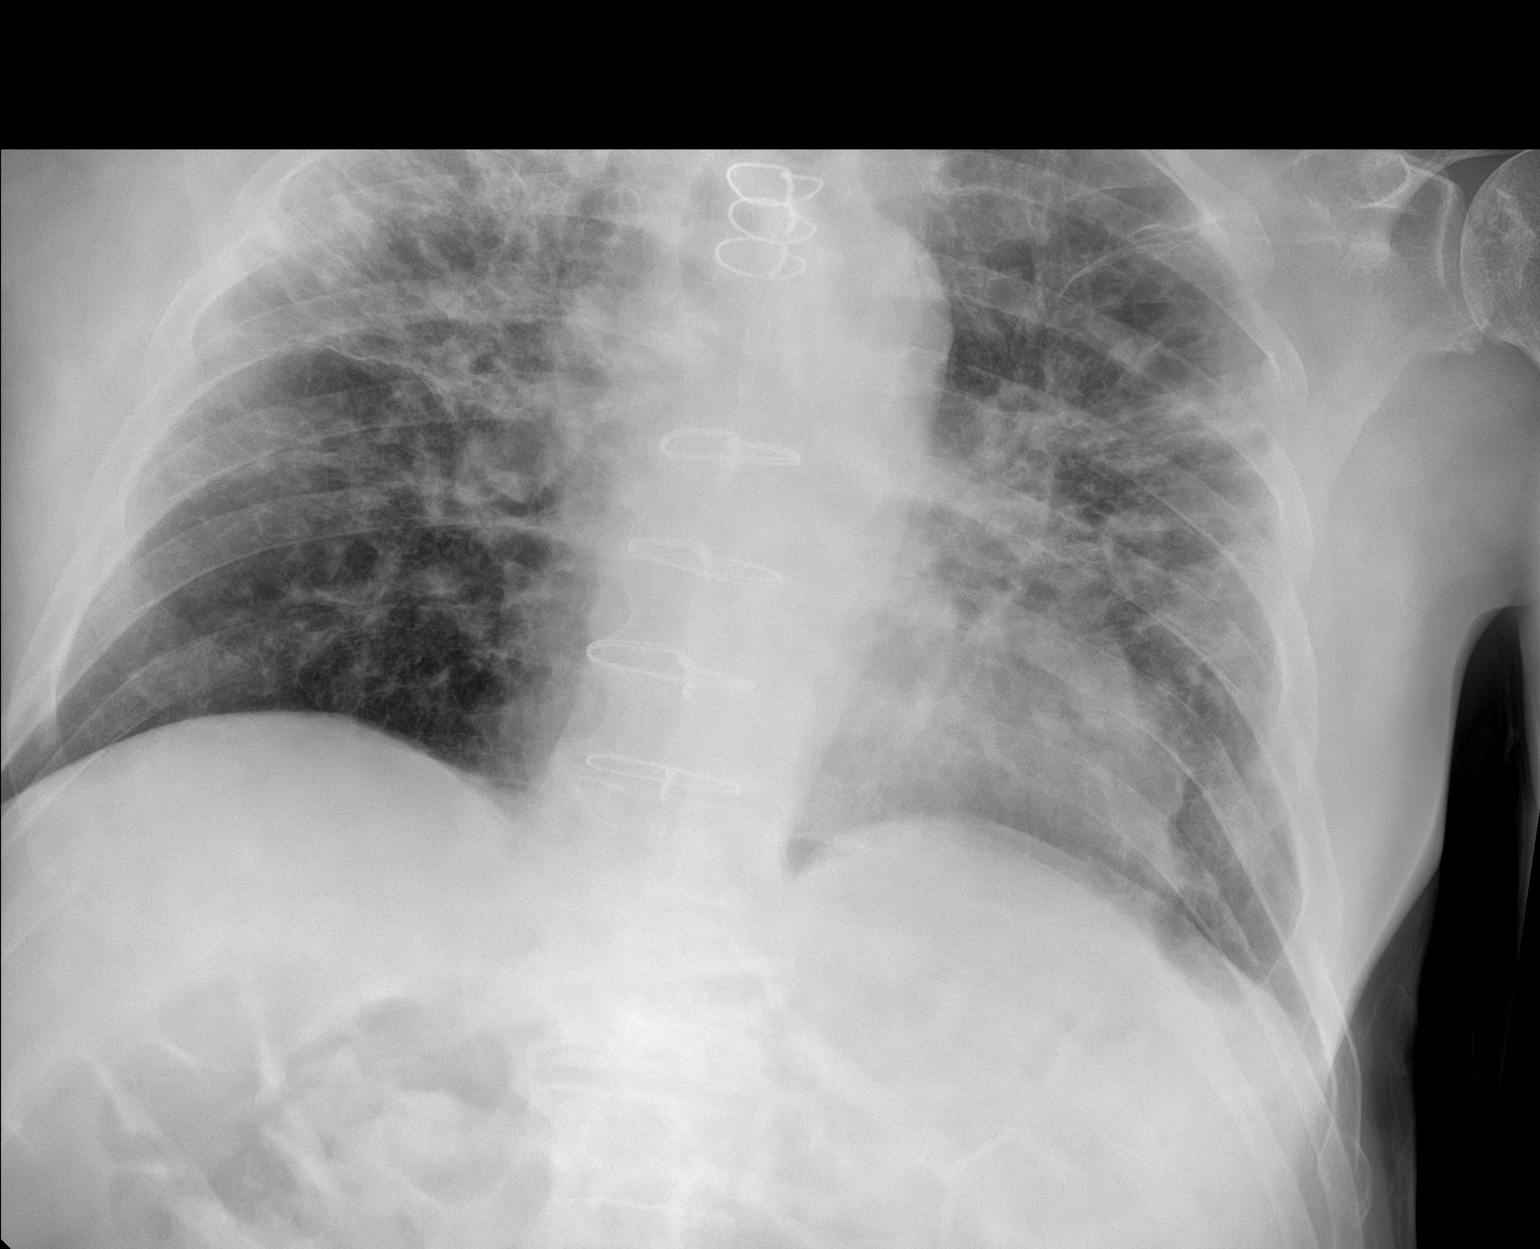

[2 of 2 positions shown; findings below may reference images not displayed]

FINDINGS: Cardiac shadow is enlarged but stable. Prior surgical changes are
seen. Patchy airspace opacity is noted with some underlying fibrotic
change. The overall appearance is similar to that seen on prior
exam.
IMPRESSION: Stable bilateral airspace opacity with underlying fibrotic changes.

## 2020-08-31 MED ORDER — BENZONATATE 100 MG PO CAPS
100.0000 mg | ORAL_CAPSULE | Freq: Two times a day (BID) | ORAL | Status: DC
  Administered 2020-08-31 – 2020-09-12 (×24): 100 mg via ORAL
  Filled 2020-08-31 (×24): qty 1

## 2020-08-31 NOTE — Progress Notes (Signed)
   Subjective:  Patient seen at bedside this AM. Patient endorses ongoing back pain after accident years ago. Patient says he told previous hospital "you have to help me get rid of this pneumonia" or else will "choke on this phlegm". He endorses having lost significant amount of weight since being at the rehab facility. He denies any fevers but notes that he feels his cough from deep in his chest. He notes that he had recovered from his COVID but then got pneumonia from being in a cold room. Extensive medical history, patient endorses that it might be "time to pass".   Objective:  Vital signs in last 24 hours: Vitals:   08/30/20 1900 08/30/20 2051 08/30/20 2151 08/31/20 0343  BP: 125/88 126/82 132/80 134/84  Pulse: 65 62 68 69  Resp: $Remo'18 15 16 16  'fGXTH$ Temp: 98.8 F (37.1 C) 98.5 F (36.9 C) 98.7 F (37.1 C) 97.6 F (36.4 C)  TempSrc: Oral Oral Oral Oral  SpO2: 99% 100% 93% 94%   Physical Exam: General: Chronically ill-appearing. Uncomfortable, but no acute distress. CV: Regular rate, rhythm. No m/r/g. MSK: Decreased strength left lower extremity. R BKA. Psych: Hyperactive, anxious, histrionic behavior  Assessment/Plan: Thomas [name] is 69yo male with previous CVA and TIA, CKD, gout, DM s/p BKA, chronic back pain s/p multiple back surgeries, chronic osteomyelitis T10-T12, recent COVID infection admitted 11/11 with acute on chronic back pain after injury at OSH.   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 Transferred to [facility] for MRI, which showed progressive disc protrusion and cord compression at T10-T11 w/o abscess. ESR 133 on arrival. Neurosurgery consulted, plan for surgery over the weekend. - Cord decompression pending via neurosurgery - Holding  antibiotics for aspirate collection in OR - Pain regimen: dilaudid PRN, home robaxin, gabapentin - Holding home oxy for urinary retention, conspitation  #Acute urinary retention #Bacteruria #Constipation Patient presented with bladder distention and right hydroureteronephrosis with 2L urinary output s/p Foley placement. Patient has hx BPH and is on chronic opioids. Will schedule bowel regimen given his constipation 2/2 opioid use. - C/w Foley for bladder decompression 2/2 stretch injury - F/u UCx - C/w BPH meds: finasteride $RemoveBeforeDEI'5mg'bnHWeExgyNjTfnaK$  qd, tamsulosin 0.$RemoveBeforeDE'4mg'dvfZJnlvXqDOXMm$  qd    #CKD stage 4 Previous BL [name] 40, Cr 2.5. Cr 1.8>1.77. Possibly new baseline, appears euvolemic still.  - Trend RFP - Avoid nephrotoxic meds  #Type 2 diabetes mellitus CBGs 100-140 this AM. Allowing to eat today, will adjust as needed. Will be NPO once surgery scheduled. - CBG monitoring - SSI  DIET: HH IVF: n/a DVT PPX: SCDs (holding AC for surgery this weekend) BOWEL: Miralax, sennakot CODE: FULL FAM COM: Will discuss with patient if he would like [place] to call any family members  Prior to Admission Living Arrangement: Home Anticipated Discharge Location: Home v SNF Barriers to Discharge: medical management Dispo: Anticipated discharge in approximately 4-5 day(s).   Thomas Dame, MD 08/31/2020, 5:27 AM Pager: [phone] After 5pm on weekdays and 1pm on weekends: On Call pager [phone]

## 2020-08-31 NOTE — Progress Notes (Signed)
PT Cancellation Note  Patient Details Name: Thomas [name] MRN: [id number] DOB: Feb 12, 1951   Cancelled Treatment:    Reason Eval/Treat Not Completed: Active bedrest order. Please remove order when patient appropriate for participation with PT/OT Evaluations.  Mabeline Caras, PT, DPT Acute Rehabilitation Services  Pager [phone] Office [place] 08/31/2020, 7:54 AM

## 2020-08-31 NOTE — Progress Notes (Signed)
   Providing Compassionate, Quality Care - Together  NEUROSURGERY PROGRESS NOTE   S: No issues overnight. D/w patient about surgical planning  O: EXAM:  BP 120/84 (BP Location: Left Arm)   Pulse 88   Temp 99.2 F (37.3 C) (Oral)   Resp 17   SpO2 95%   Awake alert oriented x3 Face symmetric, PERRLA EOMI Bilateral upper extremity 4+/5 motor strength Left lower extremity DF/PF 4/5, KE/HF 2/5 Right lower extremity BKA, KE/HF 3/5 Decrease sensation light touch in left greater than right lower extremity Bilateral patellar reflexes decreased, no clonus left lower extremity   Impression/Assessment:  69 year old male with  1.  T10/11 cord compression with cord signal change -Due to previous discitis/epidural abscess with erosion of T11 vertebral body and likely instability at this level  PLAN: - OR planned for Tuesday -medically cleared -maintain no abx for now, will get intraop specimen -ok for DVT ppx (has filter), hold oral anticoag -pain control -pt/ot ok   Thank you for allowing me to participate in this patient's care.  Please do not hesitate to call with questions or concerns.   Elwin Sleight, [facility] Cell: [phone]

## 2020-08-31 NOTE — Plan of Care (Signed)
  Problem: Pain Managment: Goal: General experience of comfort will improve Outcome: Progressing   

## 2020-08-31 NOTE — Plan of Care (Signed)
  Problem: Education: Goal: Knowledge of General Education information will improve Description: Including pain rating scale, medication(s)/side effects and non-pharmacologic comfort measures Outcome: Progressing   Problem: Nutrition: Goal: Adequate nutrition will be maintained Outcome: Progressing   Problem: Coping: Goal: Level of anxiety will decrease Outcome: Progressing   

## 2020-08-31 NOTE — Progress Notes (Signed)
OT Cancellation Note  Patient Details Name: Thomas [name] MRN: [id number] DOB: [birth year]   Cancelled Treatment:    Reason Eval/Treat Not Completed: Active bedrest order. Will continue to follow for appropriateness for eval.  [place], OTR/L Acute Rehab Services Pager [phone] Office [phone]    Almon Register 08/31/2020, 8:26 AM

## 2020-08-31 NOTE — Plan of Care (Signed)
  Problem: Education: Goal: Knowledge of General Education information will improve Description: Including pain rating scale, medication(s)/side effects and non-pharmacologic comfort measures Outcome: Progressing   Problem: Health Behavior/Discharge Planning: Goal: Ability to manage health-related needs will improve Outcome: Progressing   Problem: Clinical Measurements: Goal: Ability to maintain clinical measurements within normal limits will improve Outcome: Progressing Goal: Will remain free from infection Outcome: Progressing Goal: Diagnostic test results will improve Outcome: Progressing Goal: Respiratory complications will improve Outcome: Progressing Goal: Cardiovascular complication will be avoided Outcome: Progressing   Problem: Activity: Goal: Risk for activity intolerance will decrease Outcome: Progressing   Problem: Coping: Goal: Level of anxiety will decrease Outcome: Progressing   Problem: Nutrition: Goal: Adequate nutrition will be maintained Outcome: Progressing   Problem: Elimination: Goal: Will not experience complications related to bowel motility Outcome: Progressing Goal: Will not experience complications related to urinary retention Outcome: Progressing   Problem: Pain Managment: Goal: General experience of comfort will improve Outcome: Progressing

## 2020-09-01 DIAGNOSIS — G9520 Unspecified cord compression: Secondary | ICD-10-CM

## 2020-09-01 LAB — CBC
HCT: 32.1 % — ABNORMAL LOW (ref 39.0–52.0)
Hemoglobin: 10.4 g/dL — ABNORMAL LOW (ref 13.0–17.0)
MCH: 31.2 pg (ref 26.0–34.0)
MCHC: 32.4 g/dL (ref 30.0–36.0)
MCV: 96.4 fL (ref 80.0–100.0)
Platelets: 304 10*3/uL (ref 150–400)
RBC: 3.33 MIL/uL — ABNORMAL LOW (ref 4.22–5.81)
RDW: 16.9 % — ABNORMAL HIGH (ref 11.5–15.5)
WBC: 7.6 10*3/uL (ref 4.0–10.5)
nRBC: 0 % (ref 0.0–0.2)

## 2020-09-01 LAB — BASIC METABOLIC PANEL
Anion gap: 9 (ref 5–15)
BUN: 25 mg/dL — ABNORMAL HIGH (ref 8–23)
CO2: 21 mmol/L — ABNORMAL LOW (ref 22–32)
Calcium: 8.7 mg/dL — ABNORMAL LOW (ref 8.9–10.3)
Chloride: 104 mmol/L (ref 98–111)
Creatinine, Ser: 2.04 mg/dL — ABNORMAL HIGH (ref 0.61–1.24)
GFR, Estimated: 35 mL/min — ABNORMAL LOW (ref >60)
Glucose, Bld: 148 mg/dL — ABNORMAL HIGH (ref 70–99)
Potassium: 4.5 mmol/L (ref 3.5–5.1)
Sodium: 134 mmol/L — ABNORMAL LOW (ref 135–145)

## 2020-09-01 LAB — GLUCOSE, CAPILLARY
Glucose-Capillary: 95 mg/dL (ref 70–99)
Glucose-Capillary: 125 mg/dL — ABNORMAL HIGH (ref 70–99)
Glucose-Capillary: 139 mg/dL — ABNORMAL HIGH (ref 70–99)
Glucose-Capillary: 105 mg/dL — ABNORMAL HIGH (ref 70–99)
Glucose-Capillary: 127 mg/dL — ABNORMAL HIGH (ref 70–99)
Glucose-Capillary: 99 mg/dL (ref 70–99)
Glucose-Capillary: 130 mg/dL — ABNORMAL HIGH (ref 70–99)

## 2020-09-01 MED ORDER — CHLORHEXIDINE GLUCONATE CLOTH 2 % EX PADS
6.0000 | MEDICATED_PAD | Freq: Every day | CUTANEOUS | Status: DC
  Administered 2020-09-01 – 2020-10-03 (×32): 6 via TOPICAL

## 2020-09-01 MED ORDER — GUAIFENESIN-DM 100-10 MG/5ML PO SYRP
5.0000 mL | ORAL_SOLUTION | Freq: Four times a day (QID) | ORAL | Status: DC | PRN
  Administered 2020-09-01 – 2020-09-02 (×3): 5 mL via ORAL
  Filled 2020-09-01 (×3): qty 10

## 2020-09-01 MED ORDER — BISACODYL 10 MG RE SUPP
10.0000 mg | Freq: Once | RECTAL | Status: AC
  Administered 2020-09-01: 10 mg via RECTAL
  Filled 2020-09-01: qty 1

## 2020-09-01 NOTE — Plan of Care (Signed)
  Problem: Clinical Measurements: Goal: Ability to maintain clinical measurements within normal limits will improve Outcome: Progressing   Problem: Nutrition: Goal: Adequate nutrition will be maintained Outcome: Progressing   Problem: Coping: Goal: Level of anxiety will decrease Outcome: Progressing   Problem: Elimination: Goal: Will not experience complications related to bowel motility Outcome: Progressing   Problem: Pain Managment: Goal: General experience of comfort will improve Outcome: Progressing   Problem: Safety: Goal: Ability to remain free from injury will improve Outcome: Progressing   Problem: Skin Integrity: Goal: Risk for impaired skin integrity will decrease Outcome: Progressing   

## 2020-09-01 NOTE — Evaluation (Signed)
Physical Therapy Evaluation Patient Details Name: Thomas [name] MRN: [id number] DOB: Jan 14, 1951 Today's Date: 09/01/2020   History of Present Illness  Pt is a 69 y.o. male admitted 08/29/20 with c/o acute on chronic mid back pain, LE numbness/tingling, urinary retention and constipation since last Friday when transferred to MRI at a diffierent hospital and felt a "pop" in his back. Imaging showed T10/11 cord compression with cord signal change. Per neurosurgery, will likely need surgical decompression, instrumentation and fusion, and disc aspiration for pathology/microbiology. PMH includes R BKA (2019), osteodiscitis/epidural abscess and MRSA bacteremia s/p multiple txs of antibiotics, HTN, CVA, CKD, COVID.  Clinical Impression  Patient required mod a to sit at the edge of the bed. Once sitting he was able to sit for >5 min before reporting increased low back pain. At first he reported improved pain upon sitting. He was unable to transfer. He required mod a to get back to his bed. He was able to assist in sliding himself up. He is motivated to get stronger. He will have surgery on Tuesday and will require a re-assessment.     Follow Up Recommendations SNF    Equipment Recommendations  None recommended by PT    Recommendations for Other Services       Precautions / Restrictions Precautions Precautions: Fall Precaution Comments: Per verbal order bed rest order lifted       Mobility  Bed Mobility Overal bed mobility: Needs Assistance Bed Mobility: Supine to Sit;Sit to Supine     Supine to sit: Mod assist;+2 for physical assistance;+2 for safety/equipment Sit to supine: Mod assist;+2 for physical assistance;+2 for safety/equipment   General bed mobility comments: Mod a to sit up at the edge of the bed. Cuinfg to sit stragiht. Patient sat edge of the bed for >10 min. He reported improved pain at first but then had increased lower back pain. Heis pain was back to baseline upon transfer  back into the bed.     Transfers Overall transfer level: Needs assistance Equipment used: Rolling walker (2 wheeled) Transfers: Sit to/from Stand Sit to Stand: Supervision            Ambulation/Gait                Stairs            Wheelchair Mobility    Modified Rankin (Stroke Patients Only)       Balance                                             Pertinent Vitals/Pain Pain Assessment: Faces Faces Pain Scale: Hurts even more Pain Location: Low Back  Pain Descriptors / Indicators: Aching Pain Intervention(s): Monitored during session;Limited activity within patient's tolerance;Premedicated before session;Repositioned    Home Living Family/patient expects to be discharged to:: Skilled nursing facility                 Additional Comments: Is at rehab at this time     Prior Function Level of Independence: Independent         Comments: Patient reports he used a cane and a crutch. He has a prostheitc but reports it is too loose.      Hand Dominance   Dominant Hand: Right    Extremity/Trunk Assessment   Upper Extremity Assessment Upper Extremity Assessment: Overall WFL for tasks assessed  Lower Extremity Assessment Lower Extremity Assessment: Overall WFL for tasks assessed    Cervical / Trunk Assessment Cervical / Trunk Assessment: Normal  Communication   Communication: No difficulties  Cognition Arousal/Alertness: Awake/alert Behavior During Therapy: WFL for tasks assessed/performed Overall Cognitive Status: Within Functional Limits for tasks assessed                                 General Comments: Patient is slightly agitated about previous admissions to skilled nursing facilities but was pleasent with therapy       General Comments General comments (skin integrity, edema, etc.): Patient has prothetic but reports it is too loose     Exercises     Assessment/Plan    PT Assessment  Patient needs continued PT services  PT Problem List Decreased strength;Decreased range of motion;Decreased activity tolerance;Decreased balance;Decreased mobility;Decreased knowledge of use of DME;Pain       PT Treatment Interventions DME instruction;Gait training;Functional mobility training;Therapeutic exercise;Therapeutic activities;Neuromuscular re-education;Patient/family education    PT Goals (Current goals can be found in the Care Plan section)  Acute Rehab PT Goals Patient Stated Goal: to have surgery and to start getting stronger   PT Goal Formulation: With patient Time For Goal Achievement: 09/08/20 Potential to Achieve Goals: Good    Frequency Min 2X/week (may change after surgery )   Barriers to discharge        Co-evaluation PT/OT/SLP Co-Evaluation/Treatment: Yes             AM-PAC PT "6 Clicks" Mobility  Outcome Measure Help needed turning from your back to your side while in a flat bed without using bedrails?: A Lot Help needed moving from lying on your back to sitting on the side of a flat bed without using bedrails?: A Lot Help needed moving to and from a bed to a chair (including a wheelchair)?: Total Help needed standing up from a chair using your arms (e.g., wheelchair or bedside chair)?: Total Help needed to walk in hospital room?: Total Help needed climbing 3-5 steps with a railing? : Total 6 Click Score: 8    End of Session Equipment Utilized During Treatment: Gait belt Activity Tolerance: Patient tolerated treatment well Patient left: in chair Nurse Communication: Mobility status PT Visit Diagnosis: Unsteadiness on feet (R26.81)    Time: 5859-2924 PT Time Calculation (min) (ACUTE ONLY): 30 min   Charges:   PT Evaluation $PT Eval Moderate Complexity: 1 Mod            Carney Living PT DPT  09/01/2020, 1:05 PM

## 2020-09-01 NOTE — Progress Notes (Signed)
Subjective: HD 3 Overnight, no acute events reported.  [name] was examined at bedside this morning. He endorses feeling hungry and notes that the food quantity is not enough for him. He is requesting more food at this time. Notes that his back pain is currently well controlled but is exacerbated by his cough. He is having sputum production at this time. Discussed that this is likely remnant of his prior pneumonia. He is requesting something to help with the cough.   Objective:  Vital signs in last 24 hours: Vitals:   08/31/20 0945 08/31/20 1243 08/31/20 1931 09/01/20 0526  BP:  132/89 120/84 (!) 159/94  Pulse: 64 76 88 78  Resp:  _0 Temp:  97.6 F (36.4 C) 99.2 F (37.3 C) 98.7 F (37.1 C)  TempSrc:  Oral Oral Oral  SpO2:  96% 95% 96%   CBC Latest Ref Rng & Units 09/01/2020 08/31/2020 08/30/2020  WBC 4.0 - 10.5 K/uL 7.6 7.5 7.1  Hemoglobin 13.0 - 17.0 g/dL 10.4(L) 9.2(L) 9.1(L)  Hematocrit 39 - 52 % 32.1(L) 28.7(L) 28.9(L)  Platelets 150 - 400 K/uL 304 272 255   BMP Latest Ref Rng & Units 09/01/2020 08/31/2020 08/30/2020  Glucose 70 - 99 mg/dL 148(H) 104(H) 120(H)  BUN 8 - 23 mg/dL 25(H) 24(H) 23  Creatinine 0.61 - 1.24 mg/dL 2.04(H) 1.77(H) 1.83(H)  BUN/Creat Ratio 6 - 22 (calc) - - -  Sodium 135 - 145 mmol/L 134(L) 137 134(L)  Potassium 3.5 - 5.1 mmol/L 4.5 4.6 4.6  Chloride 98 - 111 mmol/L 104 105 103  CO2 22 - 32 mmol/L 21(L) 22 21(L)  Calcium 8.9 - 10.3 mg/dL 8.7(L) 8.8(L) 8.4(L)   Physical Exam  Constitutional: Chronically ill appearing elderly male; No distress.  Cardiovascular: Normal rate, regular rhythm, S1 and S2 present, no murmurs, rubs, gallops.  Distal pulses intact Respiratory: No respiratory distress, no accessory muscle use.  Effort is normal.  Lungs are clear to auscultation bilaterally. Musculoskeletal: Normal bulk and tone.  No peripheral edema noted. Skin: Warm and dry.  No rash, erythema, lesions noted. Psychiatric: Normal mood  and affect. Behavior is normal. Judgment and thought content normal.   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    Spinal 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-T12 Chronic osteomyelitis at T10-T12 Hx of MRSA epidural abscess Patient with progressive disc protrusion and spinal cord compression at T10-T11 without abscess. Noted to have elevated ESR on arrival but is afebrile and without leukocytosis. Neurosurgery consulted. Patient is scheduled for decompression surgery on Tuesday with Dr Reatha Armour.  - Will hold off on antibiotics for now, pending intraoperative specimen - Holding oral anticoagulation - Continue with dilaudid prn, robaxin and gabapentin - PT/OT evaluation   Acute urinary retention Presented with bladder distention and right hydroureteronephrosis with 2L UOP with Foley. Hx of BPH on chronic opioids that can be contributing. Urine culture negative thus far.  - Continue foley for bladder decompression  2/2 stretch injury - Continue finasteride and tamsulosin  CKDIV Currently at baseline with sCr of 1.7-1.8.  - F/u renal function panel - Avoid nephrotoxic agents as able  Diabetes mellitus:   CBG's 90-150 on SSI. Patient is requesting to advance diet.  - Continue SSI - Advance diet to allow for snacks - Continue CBG monitoring   Diet: HH IVF: None DVT PPx: SCDs Code status: FULL   Prior to Admission Living Arrangement: Home Anticipated Discharge Location:  Home vs SNF  Barriers to Discharge: Medical management  Dispo: Anticipated discharge in approximately 4-5 day(s).   Harvie Heck, MD  IMTS PGY-2 09/01/2020, 6:24 AM Pager: [phone] After 5pm on weekdays and 1pm on weekends: On Call pager [phone]

## 2020-09-01 NOTE — Evaluation (Signed)
Occupational Therapy Evaluation Patient Details Name: Thomas [name] MRN: [id number] DOB: [birth year] Today's Date: 09/01/2020    History of Present Illness Pt is a 69 y.o. male admitted 08/29/20 with c/o acute on chronic mid back pain, LE numbness/tingling, urinary retention and constipation since last Friday when transferred to MRI at a diffierent hospital and felt a "pop" in his back. Imaging showed T10/11 cord compression with cord signal change. Per neurosurgery, will likely need surgical decompression, instrumentation and fusion, and disc aspiration for pathology/microbiology. PMH includes R BKA (2019), osteodiscitis/epidural abscess and MRSA bacteremia s/p multiple txs of antibiotics, HTN, CVA, CKD, COVID.   Clinical Impression   PTA pt at [facility]. He is unable to give clear and accurate report of PLOF and the amount he worked with therapies. Instead, he is fixated on his SNF experience when he was isolated with COVID. Noted cognitive deficits in orientation, STM, safety, and problem solving. Pt was able to complete bed mobility with mod A +2. Education given on log roll technique for comfort. Once EOB, pt able to tolerate sitt EOB <5 mins before pain becoming to severe in lower back. Will continue to slowly progress pt prior to sx. Recommend he return to SNF for continued rehab. Will continue to follow as acutely admitted.    Follow Up Recommendations  SNF    Equipment Recommendations  None recommended by OT    Recommendations for Other Services       Precautions / Restrictions Precautions Precautions: Fall Precaution Comments: Per verbal order bed rest order lifted  Restrictions Other Position/Activity Restrictions: 11/13 via secure chat with Dr. Reatha Armour, bed rest order lifted; pending spinal sx      Mobility Bed Mobility Overal bed mobility: Needs Assistance Bed Mobility: Supine to Sit;Sit to Supine     Supine to sit: Mod assist;+2 for physical assistance;+2  for safety/equipment Sit to supine: Mod assist;+2 for physical assistance;+2 for safety/equipment   General bed mobility comments: assist to guide BLEs to EOB and to support trunk into upright sitting. Pt able to sit EOB <5 mins before pain too severe to continue    Transfers               Balance Overall balance assessment: Needs assistance Sitting-balance support: Bilateral upper extremity supported;Feet supported Sitting balance-[name]: Poor Sitting balance - Comments: at least min A to maintain safe upright posture                                   ADL either performed or assessed with clinical judgement   ADL Overall ADL's : Needs assistance/impaired Eating/Feeding: Set up;Sitting   Grooming: Set up;Sitting   Upper Body Bathing: Minimal assistance;Sitting   Lower Body Bathing: Moderate assistance;Sitting/lateral leans   Upper Body Dressing : Set up;Sitting   Lower Body Dressing: Moderate assistance;Sitting/lateral leans                 General ADL Comments: Pt currently not able to tolerate OOB t/fs 2/2 back pain. He was able to tolerate sitting EOB with mod A +2 for ~60mins before pain becoming too severe     Vision Baseline Vision/History: Wears glasses Wears Glasses: At all times Patient Visual Report: No change from baseline       Perception     Praxis      Pertinent Vitals/Pain Pain Assessment: Faces Faces Pain Scale: Hurts even more Pain Location: Low  Back  Pain Descriptors / Indicators: Aching;Sore Pain Intervention(s): Limited activity within patient's tolerance;Monitored during session;Repositioned     Hand Dominance Right   Extremity/Trunk Assessment Upper Extremity Assessment Upper Extremity Assessment: Generalized weakness   Lower Extremity Assessment Lower Extremity Assessment: Generalized weakness   Cervical / Trunk Assessment Cervical / Trunk Assessment: Normal   Communication  Communication Communication: No difficulties   Cognition Arousal/Alertness: Awake/alert Behavior During Therapy: WFL for tasks assessed/performed Overall Cognitive Status: No family/caregiver present to determine baseline cognitive functioning Area of Impairment: Orientation;Memory;Problem solving;Safety/judgement                 Orientation Level: Place (when originally asked where pt is currently living he stated "here")   Memory: Decreased short-term memory;Decreased recall of precautions   Safety/Judgement: Decreased awareness of safety;Decreased awareness of deficits   Problem Solving: Slow processing;Requires verbal cues General Comments: originally stated he lives "here" in hospital. Giving vague/nonspecific reports of being at rehab, but making it seem like he lived in an apartment PTA alone. Required increased time and cues to complete basic ADL tasks   General Comments  Patient has prothetic but reports it is too loose     Exercises     Shoulder Instructions      Home Living Family/patient expects to be discharged to:: Skilled nursing facility                                 Additional Comments: Is at rehab at this time from [facility]      Prior Functioning/Environment Level of Independence: Needs assistance        Comments: Pt is poor historian, unsure of previous level of function. States he used a cane and crutch, but has been at rehab since this incident and not fully able to give PLOF information        OT Problem List: Decreased strength;Decreased knowledge of use of DME or AE;Decreased knowledge of precautions;Decreased activity tolerance;Impaired balance (sitting and/or standing);Pain      OT Treatment/Interventions: Self-care/ADL training;Therapeutic exercise;Patient/family education;Balance training;Energy conservation;Therapeutic activities;DME and/or AE instruction    OT Goals(Current goals can be found in the care plan  section) Acute Rehab OT Goals Patient Stated Goal: to have surgery and to start getting stronger   OT Goal Formulation: With patient Time For Goal Achievement: 09/15/20 Potential to Achieve Goals: Good  OT Frequency: Min 2X/week   Barriers to D/C:            Co-evaluation PT/OT/SLP Co-Evaluation/Treatment: Yes Reason for Co-Treatment: For patient/therapist safety;To address functional/ADL transfers   OT goals addressed during session: ADL's and self-care;Strengthening/ROM      AM-PAC OT "6 Clicks" Daily Activity     Outcome Measure Help from another person eating meals?: A Little Help from another person taking care of personal grooming?: A Little Help from another person toileting, which includes using toliet, bedpan, or urinal?: A Lot Help from another person bathing (including washing, rinsing, drying)?: A Lot Help from another person to put on and taking off regular upper body clothing?: A Little Help from another person to put on and taking off regular lower body clothing?: A Lot 6 Click Score: 15   End of Session Nurse Communication: Mobility status;Patient requests pain meds  Activity Tolerance: Patient limited by pain Patient left: in bed;with call bell/phone within reach  OT Visit Diagnosis: Unsteadiness on feet (R26.81);Other abnormalities of gait and mobility (R26.89);Muscle weakness (generalized) (  M62.81);Pain Pain - part of body:  (back)                Time: 6812-7517 OT Time Calculation (min): 24 min Charges:  OT General Charges $OT Visit: 1 Visit OT Evaluation $OT Eval Moderate Complexity: [place], MSOT, OTR/L [facility] Office Number: [phone] Pager: [phone]  Zenovia Jarred 09/01/2020, 2:13 PM

## 2020-09-02 LAB — GLUCOSE, CAPILLARY
Glucose-Capillary: 108 mg/dL — ABNORMAL HIGH (ref 70–99)
Glucose-Capillary: 117 mg/dL — ABNORMAL HIGH (ref 70–99)
Glucose-Capillary: 123 mg/dL — ABNORMAL HIGH (ref 70–99)
Glucose-Capillary: 136 mg/dL — ABNORMAL HIGH (ref 70–99)
Glucose-Capillary: 141 mg/dL — ABNORMAL HIGH (ref 70–99)
Glucose-Capillary: 97 mg/dL (ref 70–99)

## 2020-09-02 LAB — CBC
HCT: 32 % — ABNORMAL LOW (ref 39.0–52.0)
Hemoglobin: 10.4 g/dL — ABNORMAL LOW (ref 13.0–17.0)
MCH: 31.2 pg (ref 26.0–34.0)
MCHC: 32.5 g/dL (ref 30.0–36.0)
MCV: 96.1 fL (ref 80.0–100.0)
Platelets: 307 10*3/uL (ref 150–400)
RBC: 3.33 MIL/uL — ABNORMAL LOW (ref 4.22–5.81)
RDW: 16.8 % — ABNORMAL HIGH (ref 11.5–15.5)
WBC: 7.9 10*3/uL (ref 4.0–10.5)
nRBC: 0 % (ref 0.0–0.2)

## 2020-09-02 LAB — BASIC METABOLIC PANEL
Anion gap: 10 (ref 5–15)
BUN: 28 mg/dL — ABNORMAL HIGH (ref 8–23)
CO2: 22 mmol/L (ref 22–32)
Calcium: 8.8 mg/dL — ABNORMAL LOW (ref 8.9–10.3)
Chloride: 105 mmol/L (ref 98–111)
Creatinine, Ser: 2.08 mg/dL — ABNORMAL HIGH (ref 0.61–1.24)
GFR, Estimated: 34 mL/min — ABNORMAL LOW (ref >60)
Glucose, Bld: 110 mg/dL — ABNORMAL HIGH (ref 70–99)
Potassium: 4.4 mmol/L (ref 3.5–5.1)
Sodium: 137 mmol/L (ref 135–145)

## 2020-09-02 MED ORDER — POLYETHYLENE GLYCOL 3350 17 G PO PACK
17.0000 g | PACK | Freq: Every day | ORAL | Status: DC
  Administered 2020-09-02 – 2020-09-05 (×3): 17 g via ORAL
  Filled 2020-09-02 (×3): qty 1

## 2020-09-02 MED ORDER — HYDROMORPHONE HCL 1 MG/ML IJ SOLN
1.0000 mg | INTRAMUSCULAR | Status: DC | PRN
  Administered 2020-09-02 – 2020-09-04 (×11): 1 mg via INTRAVENOUS
  Filled 2020-09-02 (×11): qty 1

## 2020-09-02 NOTE — Progress Notes (Signed)
MD, patient is requesting lactulose to make his BM less difficult.  He had BM last night but he had to dislodge it so it would come out.

## 2020-09-02 NOTE — Plan of Care (Signed)
  Problem: Education: Goal: Knowledge of General Education information will improve Description Including pain rating scale, medication(s)/side effects and non-pharmacologic comfort measures Outcome: Progressing   Problem: Health Behavior/Discharge Planning: Goal: Ability to manage health-related needs will improve Outcome: Progressing   

## 2020-09-02 NOTE — NC FL2 (Signed)
[place] LEVEL OF CARE SCREENING TOOL     IDENTIFICATION  Patient Name: Thomas [name] Birthdate: 10/19/1951 Sex: male Admission Date (Current Location): 08/29/2020  [facility] and [place] Number:  Herbalist and Address:  [facility] [place], [place] [address], [place], [place] [address]      Provider Number: [id number]  Attending Physician Name and Address:  Axel Filler, *  Relative Name and Phone Number:       Current Level of Care:   Recommended Level of Care:   Prior Approval Number:    Date Approved/Denied:   PASRR Number: [id number]  Discharge Plan: SNF    Current Diagnoses: Patient Active Problem List   Diagnosis Date Noted  . 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 ([place]) 08/30/2020  . Urinary retention 12/04/2019  . BPH (benign prostatic hyperplasia) 11/24/2019  . CKD (chronic kidney disease), stage IV ([place]) 11/24/2019  . Chronic pain syndrome 11/24/2019  . Physical deconditioning 11/24/2019  . 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[place]) 08/31/2019  . Pulmonary emboli ([place]) 09/29/2018  . [name], right ([place]) 09/29/2018  . [name], type 2) ([place]) 09/29/2018  . Gout 09/29/2018  . HTN (hypertension) 09/29/2018  . CHF (congestive heart failure), NYHA class I, chronic, diastolic ([place]) 29/19/1660  . GERD (gastroesophageal reflux disease) 09/29/2018    Orientation RESPIRATION BLADDER Height & Weight     Self, Time, Situation, Place  Normal (See discharge summary) Incontinent Weight: 194 lb (88 kg) Height:  6\' 7"  (200.7 cm)  BEHAVIORAL SYMPTOMS/MOOD NEUROLOGICAL BOWEL NUTRITION STATUS      Incontinent Diet (See discharge summary)  AMBULATORY STATUS COMMUNICATION OF NEEDS Skin   Extensive Assist Verbally Normal                       Personal Care Assistance Level of Assistance  Bathing, Feeding, Dressing Bathing Assistance: Maximum assistance Feeding assistance: Limited  assistance Dressing Assistance: Maximum assistance     Functional Limitations Info  Speech, Hearing, Sight Sight Info: Adequate Hearing Info: Adequate Speech Info: Adequate    SPECIAL CARE FACTORS FREQUENCY  PT (By licensed PT), OT (By licensed OT)     PT Frequency: 5x a week OT Frequency: 5x a week            Contractures Contractures Info: Not present    Additional Factors Info  Code Status, Allergies Code Status Info: Full Allergies Info: Oxycodone Oxymorphone Aspirin Beta Vulgaris Buspirone Cabbage Codeine Dolobid Diflunisal Fish Allergy Fish-derived Products Methadone Pentazocine Propoxyphene Shellfish Allergy Sulfa Antibiotics Sulfasalazine Vistaril Hydroxyzine Amoxicillin Apap Acetaminophen           Current Medications (09/02/2020):  This is the current hospital active medication list Current Facility-Administered Medications  Medication Dose Route Frequency Provider Last Rate Last Admin  . acetaminophen (TYLENOL) tablet 650 mg  650 mg Oral Q6H PRN Mosetta Anis, MD      . allopurinol (ZYLOPRIM) tablet 100 mg  100 mg Oral Daily Mosetta Anis, MD   100 mg at 09/02/20 0948  . atorvastatin (LIPITOR) tablet 20 mg  20 mg Oral Daily Mosetta Anis, MD   20 mg at 09/02/20 0948  . benzonatate (TESSALON) capsule 100 mg  100 mg Oral BID [name], MD   100 mg at 09/02/20 0948  . Chlorhexidine Gluconate Cloth 2 % PADS 6 each  6 each Topical Daily Axel Filler, MD   6 each  at 09/02/20 0948  . finasteride (PROSCAR) tablet 5 mg  5 mg Oral Daily Mosetta Anis, MD   5 mg at 09/02/20 0948  . gabapentin (NEURONTIN) capsule 300 mg  300 mg Oral BID Axel Filler, MD   300 mg at 09/02/20 0948  . guaiFENesin-dextromethorphan (ROBITUSSIN DM) 100-10 MG/5ML syrup 5 mL  5 mL Oral Q6H PRN Harvie Heck, MD   5 mL at 09/02/20 0028  . HYDROmorphone (DILAUDID) injection 1 mg  1 mg Intravenous Q4H PRN [name], MD   1 mg at 09/02/20 1239  . insulin aspart  (novoLOG) injection 0-15 Units  0-15 Units Subcutaneous Q4H Mosetta Anis, MD   2 Units at 09/01/20 [phone]  . levothyroxine (SYNTHROID) tablet 50 mcg  50 mcg Oral QAC breakfast Mosetta Anis, MD   50 mcg at 09/02/20 [phone]  . methocarbamol (ROBAXIN) tablet 750 mg  750 mg Oral Q8H PRN Mosetta Anis, MD   750 mg at 09/02/20 0947  . metoprolol tartrate (LOPRESSOR) tablet 50 mg  50 mg Oral BID Mosetta Anis, MD   50 mg at 09/02/20 0948  . ondansetron (ZOFRAN) tablet 4 mg  4 mg Oral Q6H PRN Mosetta Anis, MD       Or  . ondansetron [facility]) injection 4 mg  4 mg Intravenous Q6H PRN Mosetta Anis, MD      . polyethylene glycol (MIRALAX / GLYCOLAX) packet 17 g  17 g Oral Daily [name], MD   17 g at 09/02/20 1240  . senna-docusate (Senokot-S) tablet 1 tablet  1 tablet Oral BID Mosetta Anis, MD   1 tablet at 09/02/20 [phone]  . tamsulosin (FLOMAX) capsule 0.4 mg  0.4 mg Oral Daily Mosetta Anis, MD   0.4 mg at 09/02/20 0948  . Vitamin D (Ergocalciferol) (DRISDOL) capsule 50,000 Units  50,000 Units Oral Q Sat Mosetta Anis, MD   50,000 Units at 09/01/20 5361     Discharge Medications: Please see discharge summary for a list of discharge medications.  Relevant Imaging Results:  Relevant Lab Results:   Additional Information ss#[id number]. Patient has right BKA and has a prosthesis.  Emeterio Reeve, [place]

## 2020-09-02 NOTE — Progress Notes (Signed)
   Subjective:  Patient evaluated at bedside this AM. Endorses that he continues to have whole body jerking with his cough. He notes that this exacerbates his back pain. He endorses good appetite. Denies nausea/vomiting or abdominal pain. He did have a bowel movement last night.   Objective:  Vital signs in last 24 hours: Vitals:   09/01/20 0953 09/01/20 1627 09/01/20 2059 09/02/20 0556  BP: 127/87 (!) 154/93 129/88 (!) 155/92  Pulse: 71 77 100 82  Resp: 16 16 17 18   Temp: 98.3 F (36.8 C) 98.6 F (37 C) 98.9 F (37.2 C) 98.2 F (36.8 C)  TempSrc: Oral Oral Oral Oral  SpO2: 96% 97% 98% 94%   Physical Exam: General: Chronically ill-appearing. No acute distress Pulm: No increased work of breathing. Effort normal. MSK: No peripheral edema present bilaterally. Skin: Warm, dry.  Assessment/Plan: [name] is 69yo male (he/him) with prior CVA, CKDIV, type 2 diabetes, gout, chronic back pain s/p trauma and multiple back surgeries, chronic osteomyelitis T10-T12 admitted 11/11 for acute on chronic back pain following injury at OSH, awaiting surgery on Tuesday, 11/16.  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 Plan per Dr. Isabella Bowens for surgical decompression of spinal cord on Tuesday, 11/16. Patient is on chronic opioids and subsequently has had constipation and urinary retention. In addition, prefer dilaudid over oxycodone given his kidney function. Will try to control pain with current regimen. - Spinal cord decompression via neurosurgery on 11/16 - Holding antibiotics for aspirate  collection in OR - Increasing dilaudid 1mg  q4 - C/w home robaxin, gabapentin  #Acute urinary retention #Bacteruria #Constipation Patient continues to have good urinary output. UCx NGTD. He also had bowel movement last night, although reports it was hard and required manual disimpaction. Adding Miralax to bowel regimen. - C/w Senna, adding Miralax - C/w Foley for bladder decompression - Follow UCx - C/w BPH meds: home finasteride, tamulosin   #CKD IV sCr stable over last 24 hours, 2.08 < 2.04. Euvolemic on exam. - Trending RFP - Avoid nephrotoxic meds  #Type 2 diabetes mellitus CBG's appropriate, 100-140. Will continue with sliding scale and continue to monitor. - C/w SSI - CBG monitoring  DIET: HH IVF: n/a DVT PPX: SCDs (holding AC per neurosurgery) BOWEL: Senna, Miralax CODE: FULL FAM COM: None thus far, will discuss whether patient would like [place] to contact family  Prior to Admission Living Arrangement: Home Anticipated Discharge Location: SNF v Home Barriers to Discharge: medical and surgical management Dispo: Anticipated discharge in approximately 4 day(s).   [name], MD 09/02/2020, 6:23 AM Pager: [phone] After 5pm on weekdays and 1pm on weekends: On Call pager [phone]

## 2020-09-02 NOTE — TOC Initial Note (Signed)
Transition of Care [facility]) - Initial/Assessment Note    Patient Details  Name: Thomas [name] MRN: [id number] Date of Birth: 1951-04-20  [facility]) CM/SW Contact:    Emeterio Reeve, [place] Phone Number: 09/02/2020, 2:39 PM  Clinical Narrative:                  CSW met with pt at bedside. CSW introduced self and explained her role at the hospital.  Pt reports he has been between [facility], [facility] and 2 SNF's since December 2020. Pt reports he was signed out of [facility] about a week ago do to not getting the care he needed. PT reports [facility] took his phone and refused to let him have visitors and refused to call 911 for medical care. Pt reports that he had to "fake a suicide attempt" to get transferred to the hospital.   CSW reviewed pt/ot reccs of SNF. PT stated he is open to going to SNF but not back to [facility]. Pt has no preferences of facilities.  Pt was positive for covid in October but it outside his isolation period.   TOC to follow.   Expected Discharge Plan: Skilled Nursing Facility Barriers to Discharge: Continued Medical Work up   Patient Goals and CMS Choice Patient states their goals for this hospitalization and ongoing recovery are:: to get better CMS Medicare.gov Compare Post Acute Care list provided to:: Patient Choice offered to / list presented to : Patient  Expected Discharge Plan and Services Expected Discharge Plan: [place] arrangements for the past 2 months: [place]                                      Prior Living Arrangements/Services Living arrangements for the past 2 months: [place] Lives with:: Facility Resident Patient language and need for interpreter reviewed:: Yes Do you feel safe going back to the place where you live?: Yes      Need for Family Participation in Patient Care: Yes (Comment) Care giver support system in place?: No (comment)    Criminal Activity/Legal Involvement Pertinent to Current Situation/Hospitalization: No - Comment as needed  Activities of Daily Living Home Assistive Devices/Equipment: Eyeglasses, Wheelchair ADL Screening (condition at time of admission) Patient's cognitive ability adequate to safely complete daily activities?: Yes Is the patient deaf or have difficulty hearing?: No Does the patient have difficulty seeing, even when wearing glasses/contacts?: No Does the patient have difficulty concentrating, remembering, or making decisions?: No Patient able to express need for assistance with ADLs?: Yes Does the patient have difficulty dressing or bathing?: No Independently performs ADLs?: Yes (appropriate for developmental age) Does the patient have difficulty walking or climbing stairs?: Yes Weakness of Legs: Both Weakness of Arms/Hands: None  Permission Sought/Granted Permission sought to share information with : Chartered certified accountant granted to share information with : Yes, Verbal Permission Granted     Permission granted to share info w AGENCY: SNF        Emotional Assessment Appearance:: Appears stated age Attitude/Demeanor/Rapport: Engaged Affect (typically observed): Appropriate Orientation: : Oriented to Situation, Oriented to Self, Oriented to Place, Oriented to  Time Alcohol / Substance Use: Not Applicable Psych Involvement: No (comment)  Admission diagnosis:  Urinary retention [R33.9] Cord compression (HCC) [G95.20] Acute UTI [N39.0] Acute midline low back pain with sciatica, sciatica laterality  unspecified [M54.40] 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 [facility]) [S24.103A] Patient Active Problem List   Diagnosis Date Noted  . 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 ([place]) 08/30/2020  . Urinary retention 12/04/2019  . BPH (benign prostatic hyperplasia) 11/24/2019  . CKD (chronic kidney disease), stage IV ([place]) 11/24/2019  . Chronic pain syndrome 11/24/2019  .  Physical deconditioning 11/24/2019  . 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[place]) 08/31/2019  . Pulmonary emboli ([place]) 09/29/2018  . [name], right ([place]) 09/29/2018  . [name], type 2) ([place]) 09/29/2018  . Gout 09/29/2018  . HTN (hypertension) 09/29/2018  . CHF (congestive heart failure), NYHA class I, chronic, diastolic ([place]) 37/85/8850  . GERD (gastroesophageal reflux disease) 09/29/2018   PCP:  Antonietta Jewel, MD Pharmacy:   [place], [place] - [address] [place] [address] Phone: [phone] Fax: [phone]     Social Determinants of Health ([place]) Interventions    Readmission Risk Interventions Readmission Risk Prevention Plan 09/17/2019  Transportation Screening Complete  PCP or Specialist Appt within 5-7 Days Not Complete  Not Complete comments snf vs home  Home Care Screening Complete  Medication Review (RN CM) Complete  Some recent data might be hidden   Emeterio Reeve, Latanya Presser, [place] Social Worker [phone]

## 2020-09-03 ENCOUNTER — Other Ambulatory Visit: Payer: Self-pay | Admitting: Neurological Surgery

## 2020-09-03 LAB — BASIC METABOLIC PANEL
Anion gap: 8 (ref 5–15)
BUN: 33 mg/dL — ABNORMAL HIGH (ref 8–23)
CO2: 21 mmol/L — ABNORMAL LOW (ref 22–32)
Calcium: 8.3 mg/dL — ABNORMAL LOW (ref 8.9–10.3)
Chloride: 105 mmol/L (ref 98–111)
Creatinine, Ser: 2.44 mg/dL — ABNORMAL HIGH (ref 0.61–1.24)
GFR, Estimated: 28 mL/min — ABNORMAL LOW (ref >60)
Glucose, Bld: 103 mg/dL — ABNORMAL HIGH (ref 70–99)
Potassium: 4.8 mmol/L (ref 3.5–5.1)
Sodium: 134 mmol/L — ABNORMAL LOW (ref 135–145)

## 2020-09-03 LAB — GLUCOSE, CAPILLARY
Glucose-Capillary: 102 mg/dL — ABNORMAL HIGH (ref 70–99)
Glucose-Capillary: 115 mg/dL — ABNORMAL HIGH (ref 70–99)
Glucose-Capillary: 142 mg/dL — ABNORMAL HIGH (ref 70–99)
Glucose-Capillary: 144 mg/dL — ABNORMAL HIGH (ref 70–99)
Glucose-Capillary: 95 mg/dL (ref 70–99)
Glucose-Capillary: 96 mg/dL (ref 70–99)

## 2020-09-03 LAB — CBC
HCT: 30.8 % — ABNORMAL LOW (ref 39.0–52.0)
Hemoglobin: 9.8 g/dL — ABNORMAL LOW (ref 13.0–17.0)
MCH: 30.9 pg (ref 26.0–34.0)
MCHC: 31.8 g/dL (ref 30.0–36.0)
MCV: 97.2 fL (ref 80.0–100.0)
Platelets: 305 10*3/uL (ref 150–400)
RBC: 3.17 MIL/uL — ABNORMAL LOW (ref 4.22–5.81)
RDW: 16.7 % — ABNORMAL HIGH (ref 11.5–15.5)
WBC: 9.6 10*3/uL (ref 4.0–10.5)
nRBC: 0 % (ref 0.0–0.2)

## 2020-09-03 LAB — SURGICAL PCR SCREEN
MRSA, PCR: POSITIVE — AB
Staphylococcus aureus: POSITIVE — AB

## 2020-09-03 MED ORDER — HEPARIN SODIUM (PORCINE) 5000 UNIT/ML IJ SOLN
5000.0000 [IU] | Freq: Three times a day (TID) | INTRAMUSCULAR | Status: DC
  Administered 2020-09-03 (×2): 5000 [IU] via SUBCUTANEOUS
  Filled 2020-09-03 (×2): qty 1

## 2020-09-03 MED ORDER — MUPIROCIN 2 % EX OINT
1.0000 "application " | TOPICAL_OINTMENT | Freq: Two times a day (BID) | CUTANEOUS | Status: AC
  Administered 2020-09-03 – 2020-09-08 (×10): 1 via NASAL
  Filled 2020-09-03: qty 22

## 2020-09-03 MED ORDER — ENOXAPARIN SODIUM 40 MG/0.4ML ~~LOC~~ SOLN: 40.0000 mg | SUBCUTANEOUS | Status: DC

## 2020-09-03 MED ORDER — SODIUM CHLORIDE 0.9 % IV BOLUS
500.0000 mL | Freq: Once | INTRAVENOUS | Status: AC
  Administered 2020-09-03: 500 mL via INTRAVENOUS

## 2020-09-03 NOTE — Plan of Care (Signed)
  Problem: Clinical Measurements: Goal: Ability to maintain clinical measurements within normal limits will improve Outcome: Progressing   Problem: Nutrition: Goal: Adequate nutrition will be maintained Outcome: Progressing   Problem: Coping: Goal: Level of anxiety will decrease Outcome: Progressing   Problem: Elimination: Goal: Will not experience complications related to bowel motility Outcome: Progressing   Problem: Pain Managment: Goal: General experience of comfort will improve Outcome: Progressing   Problem: Safety: Goal: Ability to remain free from injury will improve Outcome: Progressing   Problem: Skin Integrity: Goal: Risk for impaired skin integrity will decrease Outcome: Progressing   

## 2020-09-03 NOTE — Progress Notes (Addendum)
   Subjective:  Patient evaluated at bedside this AM. He endorses feeling better this morning as he just received his pain medications. He was able to be evaluated by neurosurgery this morning and is agreeable for the surgery tomorrow. He is in good spirits this morning.   Objective:  Vital signs in last 24 hours: Vitals:   09/02/20 1636 09/02/20 1938 09/03/20 0421 09/03/20 0500  BP: (!) 141/83 (!) 148/90 138/86   Pulse: 79 90 70   Resp: 20 17 17    Temp: 98.3 F (36.8 C) 98.4 F (36.9 C) 98.1 F (36.7 C)   TempSrc: Oral Oral Oral   SpO2: 98% 95% 97%   Weight:    95.3 kg  Height:       Physical Exam: General: Chronically ill-appearing. No acute distress Neuro: Awake, alert, oriented x3. Moving extremities appropriately. Psych: Histrionic, grandiose.  Assessment/Plan: Mr. Erhardt is 69yo male (he/him) with prior CVA, CKD IV, type 2 diabetes, gout, chronic back pain s/p trauma and multiple back surgeries, chronic osteomyelitis T10-T12 admitted 11/11 for acute on chronic back pain following injury at OSH, plan for surgical decompression of spinal cord tomorrow, 11/16.  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 Plan per Dr. Reatha Armour for surgical decompression of spinal cord tomorrow, 11/16. Will continue to avoid oxycodone given kidney function and chronic urinary retention and constipation. Can adjust medication s/p surgery for comfort. - Spinal cord decompression 11/16 - NPO @ [place] - Holding abx for aspirate collection in OR - C/w dilaudid 1mg  q4, home robaxin,  gabapentin  #Acute urinary retention #Bacteruria #Constipation  Continued appropriate urinary output. UCx NGTD. Will continue with bowel regimen and avoid oxycodone for now. - C/w Senna, Miralax - Foley for bladder decompression - C/w home finasteride, tamulsoin  #CKD IV sCr 2.44 < 2.08. Patient with only 786mL po intake over last 24 hours. Patient desires to eat some more, will transition diet to regular.  Will give bolus fluids as well this afternoon. - Trending RFP - Avoid nephrotoxic meds - 0.5L NS bolus  #Type 2 diabetes mellitus CBG's continue to be appropriate. Will need to continue close monitoring given he will be NPO at midnight tonight. - C/w SSI - CBG monitoring  DIET: Regular IVF: 1/2L bolus DVT PPX: Lovenox BOWEL: Senna, Miralax CODE: FULL FAM COM: n/a  Prior to Admission Living Arrangement: Home Anticipated Discharge Location: Home vs SNF Barriers to Discharge: medical, surgical management Dispo: Anticipated discharge in approximately 2-3 day(s).   [name], MD 09/03/2020, 6:37 AM Pager: [phone] After 5pm on weekdays and 1pm on weekends: On Call pager [phone]

## 2020-09-03 NOTE — Progress Notes (Signed)
   Providing Compassionate, Quality Care - Together  NEUROSURGERY PROGRESS NOTE   S: No issues overnight. Ready for surgery tomorrow  O: EXAM:  BP (!) 132/96 (BP Location: Left Arm)   Pulse 78   Temp 98.3 F (36.8 C) (Oral)   Resp 20   Ht 6\' 7"  (2.007 m)   Wt 95.3 kg   SpO2 95%   BMI 23.67 kg/m    Awake alert oriented x3 Face symmetric, PERRLA EOMI Bilateral upper extremity 4+/5 motor strength Left lower extremity DF/PF 4/5, KE/HF 2/5 Right lower extremity BKA, KE/HF 3/5 Decrease sensation light touch in left greater than right lower extremity Bilateral patellar reflexes decreased, no clonus left lower extremity   Impression/Assessment: 69 year old male with  1.T10/11 cord compression with cord signal change -Due to previous discitis/epidural abscess with erosion of T11 vertebral body and likely instability at this level  PLAN: - OR planned for tomorrow -medically cleared -maintain no abx for now, will get intraop specimen -ok for DVT ppx (has filter), hold oral anticoag -pain control -pt/ot ok    Thank you for allowing me to participate in this patient's care.  Please do not hesitate to call with questions or concerns.   Elwin Sleight, [facility] Cell: [phone]

## 2020-09-04 ENCOUNTER — Inpatient Hospital Stay (HOSPITAL_COMMUNITY)
Admission: EM | Disposition: A | Discharge status: Skilled Nursing Facility | Payer: Self-pay | Source: Home / Self Care | Attending: Internal Medicine

## 2020-09-04 ENCOUNTER — Inpatient Hospital Stay (HOSPITAL_COMMUNITY): Payer: Medicare Other | Admitting: Certified Registered Nurse Anesthetist

## 2020-09-04 ENCOUNTER — Inpatient Hospital Stay (HOSPITAL_COMMUNITY): Payer: Medicare Other

## 2020-09-04 DIAGNOSIS — M4640 Discitis, unspecified, site unspecified: Secondary | ICD-10-CM

## 2020-09-04 DIAGNOSIS — R339 Retention of urine, unspecified: Secondary | ICD-10-CM

## 2020-09-04 LAB — GLUCOSE, CAPILLARY
Glucose-Capillary: 121 mg/dL — ABNORMAL HIGH (ref 70–99)
Glucose-Capillary: 113 mg/dL — ABNORMAL HIGH (ref 70–99)
Glucose-Capillary: 99 mg/dL (ref 70–99)
Glucose-Capillary: 198 mg/dL — ABNORMAL HIGH (ref 70–99)
Glucose-Capillary: 188 mg/dL — ABNORMAL HIGH (ref 70–99)

## 2020-09-04 LAB — CULTURE, BLOOD (ROUTINE X 2)
Culture: NO GROWTH
Culture: NO GROWTH
Special Requests: ADEQUATE

## 2020-09-04 LAB — CBC
HCT: 31.5 % — ABNORMAL LOW (ref 39.0–52.0)
Hemoglobin: 9.9 g/dL — ABNORMAL LOW (ref 13.0–17.0)
MCH: 30.9 pg (ref 26.0–34.0)
MCHC: 31.4 g/dL (ref 30.0–36.0)
MCV: 98.4 fL (ref 80.0–100.0)
Platelets: 302 10*3/uL (ref 150–400)
RBC: 3.2 MIL/uL — ABNORMAL LOW (ref 4.22–5.81)
RDW: 16.5 % — ABNORMAL HIGH (ref 11.5–15.5)
WBC: 8.8 10*3/uL (ref 4.0–10.5)
nRBC: 0 % (ref 0.0–0.2)

## 2020-09-04 LAB — BASIC METABOLIC PANEL
Anion gap: 9 (ref 5–15)
BUN: 30 mg/dL — ABNORMAL HIGH (ref 8–23)
CO2: 25 mmol/L (ref 22–32)
Calcium: 8.9 mg/dL (ref 8.9–10.3)
Chloride: 105 mmol/L (ref 98–111)
Creatinine, Ser: 2.24 mg/dL — ABNORMAL HIGH (ref 0.61–1.24)
GFR, Estimated: 31 mL/min — ABNORMAL LOW (ref >60)
Glucose, Bld: 140 mg/dL — ABNORMAL HIGH (ref 70–99)
Potassium: 4.9 mmol/L (ref 3.5–5.1)
Sodium: 139 mmol/L (ref 135–145)

## 2020-09-04 LAB — TYPE AND SCREEN
ABO/RH(D): A POS
Antibody Screen: NEGATIVE

## 2020-09-04 LAB — PROTIME-INR
INR: 1 (ref 0.8–1.2)
INR: 1 (ref 0.8–1.2)
Prothrombin Time: 12.8 seconds (ref 11.4–15.2)
Prothrombin Time: 13 s (ref 11.4–15.2)

## 2020-09-04 LAB — APTT: aPTT: 31 seconds (ref 24–36)

## 2020-09-04 LAB — MAGNESIUM: Magnesium: 1.8 mg/dL (ref 1.7–2.4)

## 2020-09-04 IMAGING — RF DG THORACIC SPINE 2V
1 series · 2 of 2 positions shown · non-contrast
Comparison: MRI thoracic spine [DATE].

CLINICAL DATA: Surgery, elective. Additional history provided:
T10-12 fusion. Provided fluoroscopy time 47 seconds.

EXAM:
THORACIC SPINE 2 VIEWS; DG C-ARM 1-60 MIN

[Series 1: run · 2 of 2 slices shown]
[im 1/2]
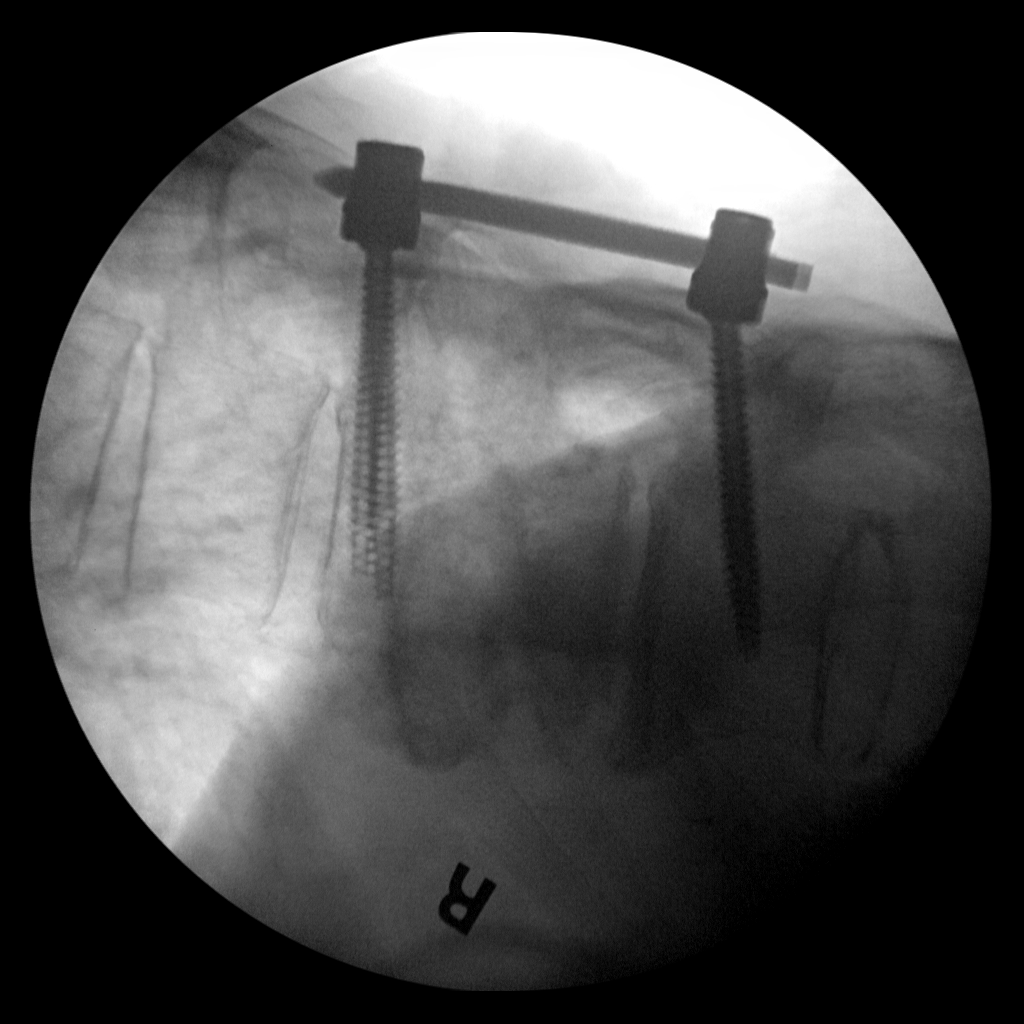
[im 2/2]
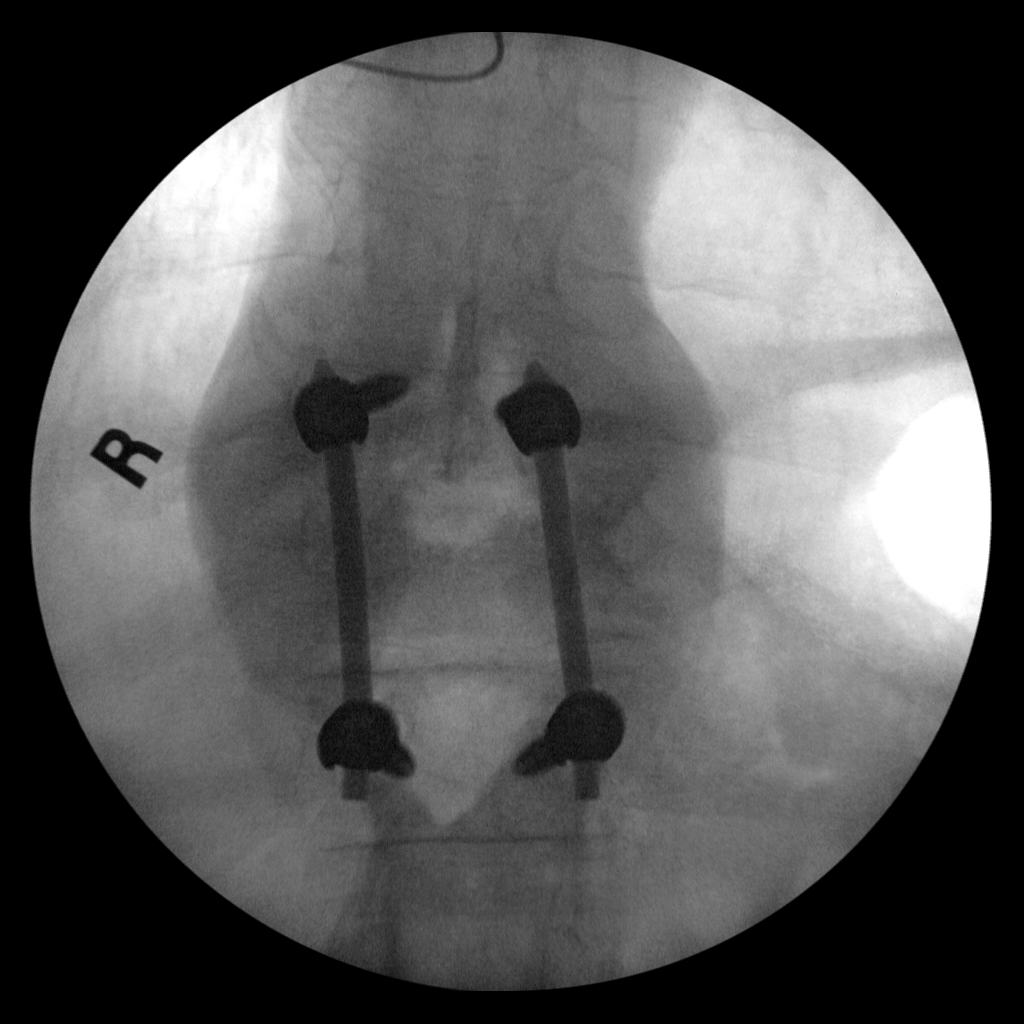

[2 of 2 positions shown; findings below may reference images not displayed]

FINDINGS: PA and lateral view intraoperative fluoroscopic images of the
thoracic spine are submitted, 2 images total. The images
demonstrates a posterior spinal fusion construct spanning what are
presumed to be the T10-T12 levels (utilizing bilateral T10 and T12
pedicle screws and vertical interconnecting rods).
IMPRESSION: Two intraoperative fluoroscopic images of the thoracic spine, as
described.

## 2020-09-04 IMAGING — RF DG C-ARM 1-60 MIN
1 series · 2 of 2 positions shown · non-contrast
Comparison: MRI thoracic spine [DATE].

CLINICAL DATA: Surgery, elective. Additional history provided:
T10-12 fusion. Provided fluoroscopy time 47 seconds.

EXAM:
THORACIC SPINE 2 VIEWS; DG C-ARM 1-60 MIN

[Series 1: run · 2 of 2 slices shown]
[im 1/2]
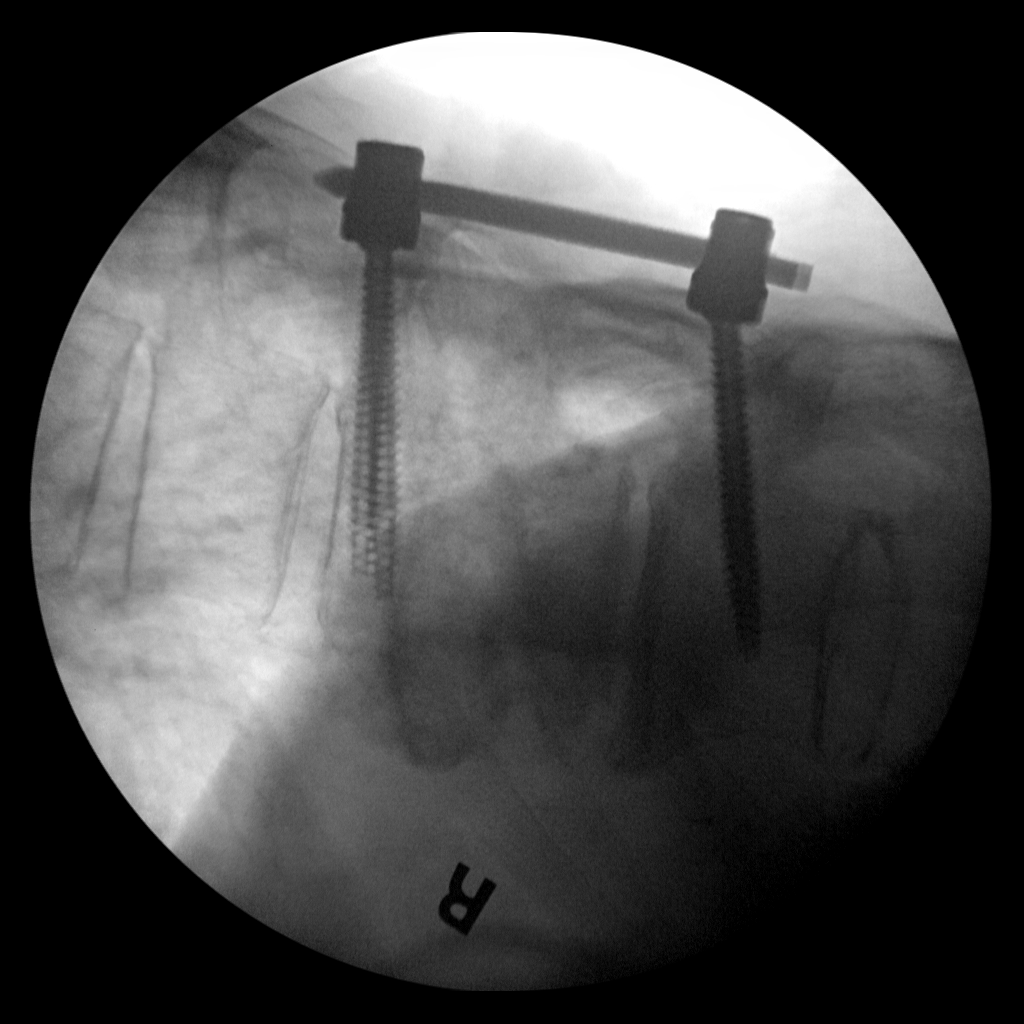
[im 2/2]
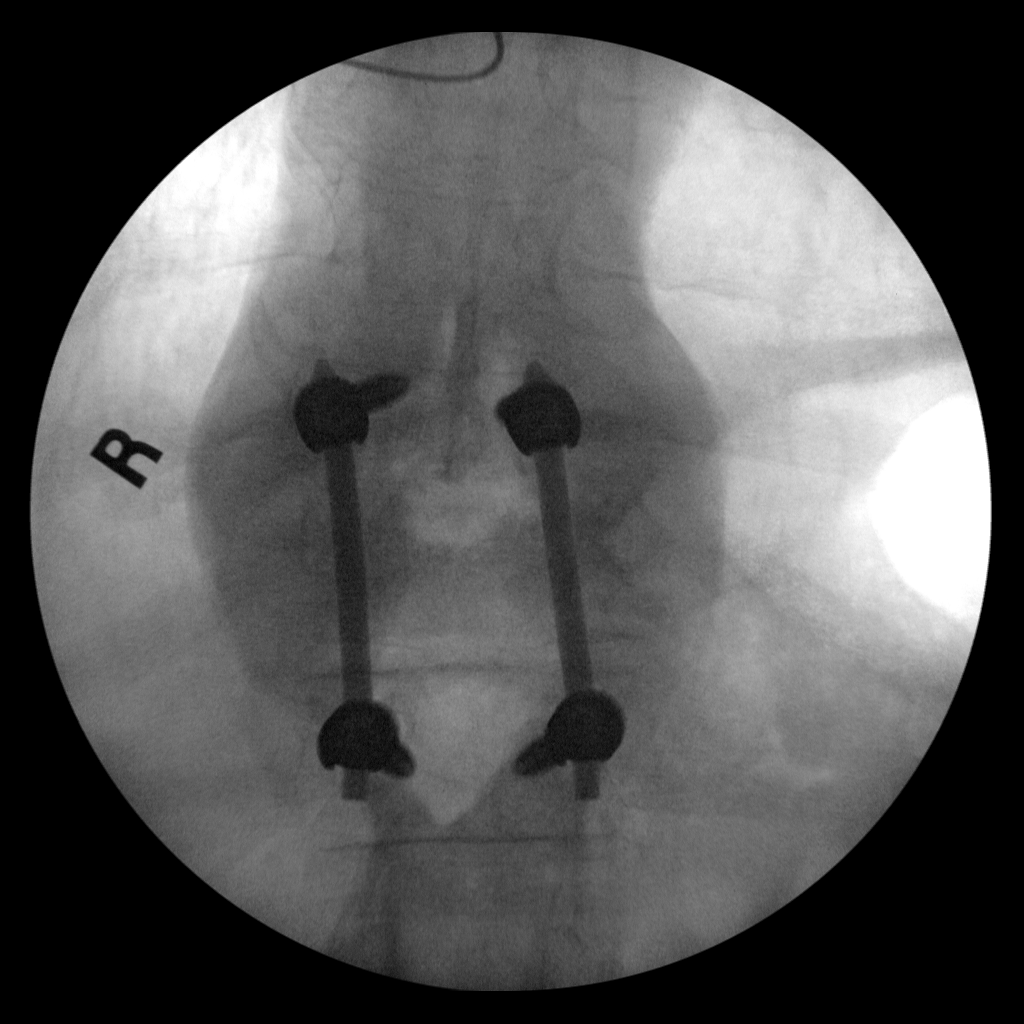

[2 of 2 positions shown; findings below may reference images not displayed]

FINDINGS: PA and lateral view intraoperative fluoroscopic images of the
thoracic spine are submitted, 2 images total. The images
demonstrates a posterior spinal fusion construct spanning what are
presumed to be the T10-T12 levels (utilizing bilateral T10 and T12
pedicle screws and vertical interconnecting rods).
IMPRESSION: Two intraoperative fluoroscopic images of the thoracic spine, as
described.

## 2020-09-04 SURGERY — POSTERIOR LUMBAR FUSION 2 LEVEL
Anesthesia: General | Site: Back

## 2020-09-04 MED ORDER — DEXAMETHASONE SODIUM PHOSPHATE 10 MG/ML IJ SOLN
INTRAMUSCULAR | Status: AC
  Filled 2020-09-04: qty 1

## 2020-09-04 MED ORDER — PHENOL 1.4 % MT LIQD: 1.0000 | OROMUCOSAL | Status: DC | PRN

## 2020-09-04 MED ORDER — ONDANSETRON HCL 4 MG/2ML IJ SOLN
INTRAMUSCULAR | Status: AC
  Filled 2020-09-04: qty 2

## 2020-09-04 MED ORDER — ALBUMIN HUMAN 5 % IV SOLN: INTRAVENOUS | Status: DC | PRN

## 2020-09-04 MED ORDER — ALUM & MAG HYDROXIDE-SIMETH 200-200-20 MG/5ML PO SUSP: 30.0000 mL | Freq: Four times a day (QID) | ORAL | Status: DC | PRN

## 2020-09-04 MED ORDER — 0.9 % SODIUM CHLORIDE (POUR BTL) OPTIME
TOPICAL | Status: DC | PRN
  Administered 2020-09-04: 1000 mL

## 2020-09-04 MED ORDER — VANCOMYCIN HCL 1000 MG IV SOLR
INTRAVENOUS | Status: AC
  Filled 2020-09-04: qty 1000

## 2020-09-04 MED ORDER — ROCURONIUM BROMIDE 10 MG/ML (PF) SYRINGE
PREFILLED_SYRINGE | INTRAVENOUS | Status: DC | PRN
  Administered 2020-09-04: 20 mg via INTRAVENOUS
  Administered 2020-09-04: 80 mg via INTRAVENOUS
  Administered 2020-09-04: 20 mg via INTRAVENOUS
  Administered 2020-09-04: 30 mg via INTRAVENOUS

## 2020-09-04 MED ORDER — SODIUM CHLORIDE 0.9% FLUSH
3.0000 mL | Freq: Two times a day (BID) | INTRAVENOUS | Status: DC
  Administered 2020-09-04 – 2020-10-02 (×23): 3 mL via INTRAVENOUS

## 2020-09-04 MED ORDER — SUGAMMADEX SODIUM 200 MG/2ML IV SOLN
INTRAVENOUS | Status: DC | PRN
  Administered 2020-09-04: 200 mg via INTRAVENOUS

## 2020-09-04 MED ORDER — CHLORHEXIDINE GLUCONATE CLOTH 2 % EX PADS
6.0000 | MEDICATED_PAD | Freq: Once | CUTANEOUS | Status: AC
  Administered 2020-09-04: 6 via TOPICAL

## 2020-09-04 MED ORDER — EPHEDRINE SULFATE-NACL 50-0.9 MG/10ML-% IV SOSY
PREFILLED_SYRINGE | INTRAVENOUS | Status: DC | PRN
  Administered 2020-09-04: 10 mg via INTRAVENOUS

## 2020-09-04 MED ORDER — FENTANYL CITRATE (PF) 250 MCG/5ML IJ SOLN
INTRAMUSCULAR | Status: AC
  Filled 2020-09-04: qty 5

## 2020-09-04 MED ORDER — FENTANYL CITRATE (PF) 100 MCG/2ML IJ SOLN
INTRAMUSCULAR | Status: AC
  Filled 2020-09-04: qty 2

## 2020-09-04 MED ORDER — DEXAMETHASONE SODIUM PHOSPHATE 10 MG/ML IJ SOLN
INTRAMUSCULAR | Status: DC | PRN
  Administered 2020-09-04: 5 mg via INTRAVENOUS

## 2020-09-04 MED ORDER — FENTANYL CITRATE (PF) 250 MCG/5ML IJ SOLN
INTRAMUSCULAR | Status: DC | PRN
  Administered 2020-09-04: 25 ug via INTRAVENOUS
  Administered 2020-09-04: 75 ug via INTRAVENOUS
  Administered 2020-09-04: 50 ug via INTRAVENOUS
  Administered 2020-09-04: 100 ug via INTRAVENOUS

## 2020-09-04 MED ORDER — ROCURONIUM BROMIDE 10 MG/ML (PF) SYRINGE
PREFILLED_SYRINGE | INTRAVENOUS | Status: AC
  Filled 2020-09-04: qty 10

## 2020-09-04 MED ORDER — SENNOSIDES-DOCUSATE SODIUM 8.6-50 MG PO TABS: 1.0000 | ORAL_TABLET | Freq: Every evening | ORAL | Status: DC | PRN

## 2020-09-04 MED ORDER — LIDOCAINE 2% (20 MG/ML) 5 ML SYRINGE
INTRAMUSCULAR | Status: AC
  Filled 2020-09-04: qty 5

## 2020-09-04 MED ORDER — VANCOMYCIN HCL IN DEXTROSE 1-5 GM/200ML-% IV SOLN
1000.0000 mg | INTRAVENOUS | Status: AC
  Administered 2020-09-04: 1000 mg via INTRAVENOUS
  Filled 2020-09-04: qty 200

## 2020-09-04 MED ORDER — SODIUM CHLORIDE 0.9 % IV SOLN: INTRAVENOUS | Status: DC

## 2020-09-04 MED ORDER — EPHEDRINE 5 MG/ML INJ
INTRAVENOUS | Status: AC
  Filled 2020-09-04: qty 10

## 2020-09-04 MED ORDER — THROMBIN 5000 UNITS EX SOLR
CUTANEOUS | Status: AC
  Filled 2020-09-04: qty 5000

## 2020-09-04 MED ORDER — MENTHOL 3 MG MT LOZG: 1.0000 | LOZENGE | OROMUCOSAL | Status: DC | PRN

## 2020-09-04 MED ORDER — CHLORHEXIDINE GLUCONATE 0.12 % MT SOLN
15.0000 mL | Freq: Once | OROMUCOSAL | Status: AC
  Administered 2020-09-04: 15 mL via OROMUCOSAL
  Filled 2020-09-04: qty 15

## 2020-09-04 MED ORDER — PHENYLEPHRINE 40 MCG/ML (10ML) SYRINGE FOR IV PUSH (FOR BLOOD PRESSURE SUPPORT)
PREFILLED_SYRINGE | INTRAVENOUS | Status: DC | PRN
  Administered 2020-09-04: 80 ug via INTRAVENOUS
  Administered 2020-09-04: 120 ug via INTRAVENOUS
  Administered 2020-09-04: 80 ug via INTRAVENOUS
  Administered 2020-09-04: 120 ug via INTRAVENOUS

## 2020-09-04 MED ORDER — LIDOCAINE 2% (20 MG/ML) 5 ML SYRINGE
INTRAMUSCULAR | Status: DC | PRN
  Administered 2020-09-04: 40 mg via INTRAVENOUS
  Administered 2020-09-04: 60 mg via INTRAVENOUS

## 2020-09-04 MED ORDER — PROPOFOL 10 MG/ML IV BOLUS
INTRAVENOUS | Status: DC | PRN
  Administered 2020-09-04: 150 mg via INTRAVENOUS

## 2020-09-04 MED ORDER — METHOCARBAMOL 500 MG PO TABS
500.0000 mg | ORAL_TABLET | Freq: Four times a day (QID) | ORAL | Status: DC | PRN
  Administered 2020-09-04: 500 mg via ORAL
  Filled 2020-09-04: qty 1

## 2020-09-04 MED ORDER — METHOCARBAMOL 1000 MG/10ML IJ SOLN
500.0000 mg | Freq: Four times a day (QID) | INTRAVENOUS | Status: DC | PRN
  Filled 2020-09-04: qty 5

## 2020-09-04 MED ORDER — FENTANYL CITRATE (PF) 100 MCG/2ML IJ SOLN
25.0000 ug | INTRAMUSCULAR | Status: DC | PRN
  Administered 2020-09-04: 25 ug via INTRAVENOUS

## 2020-09-04 MED ORDER — HYDROCODONE-ACETAMINOPHEN 10-325 MG PO TABS
1.0000 | ORAL_TABLET | ORAL | Status: DC | PRN
  Administered 2020-09-04 – 2020-09-06 (×5): 1 via ORAL
  Filled 2020-09-04 (×6): qty 1

## 2020-09-04 MED ORDER — HYDROMORPHONE HCL 1 MG/ML IJ SOLN
INTRAMUSCULAR | Status: AC
  Administered 2020-09-04: 1 mg via INTRAVENOUS
  Filled 2020-09-04: qty 1

## 2020-09-04 MED ORDER — VANCOMYCIN HCL 1000 MG IV SOLR
INTRAVENOUS | Status: DC | PRN
  Administered 2020-09-04: 1000 mg

## 2020-09-04 MED ORDER — HEPARIN SODIUM (PORCINE) 5000 UNIT/ML IJ SOLN
5000.0000 [IU] | Freq: Two times a day (BID) | INTRAMUSCULAR | Status: DC
  Administered 2020-09-05 – 2020-09-06 (×3): 5000 [IU] via SUBCUTANEOUS
  Filled 2020-09-04 (×4): qty 1

## 2020-09-04 MED ORDER — THROMBIN 5000 UNITS EX SOLR
OROMUCOSAL | Status: DC | PRN
  Administered 2020-09-04 (×2): 5 mL via TOPICAL

## 2020-09-04 MED ORDER — SODIUM CHLORIDE 0.9 % IV SOLN: 250.0000 mL | INTRAVENOUS | Status: DC

## 2020-09-04 MED ORDER — ONDANSETRON HCL 4 MG/2ML IJ SOLN
INTRAMUSCULAR | Status: DC | PRN
  Administered 2020-09-04: 4 mg via INTRAVENOUS

## 2020-09-04 MED ORDER — BUPIVACAINE-EPINEPHRINE 0.5% -1:200000 IJ SOLN
INTRAMUSCULAR | Status: AC
  Filled 2020-09-04: qty 1

## 2020-09-04 MED ORDER — PHENYLEPHRINE 40 MCG/ML (10ML) SYRINGE FOR IV PUSH (FOR BLOOD PRESSURE SUPPORT)
PREFILLED_SYRINGE | INTRAVENOUS | Status: AC
  Filled 2020-09-04: qty 10

## 2020-09-04 MED ORDER — LIDOCAINE-EPINEPHRINE 1 %-1:100000 IJ SOLN
INTRAMUSCULAR | Status: DC | PRN
  Administered 2020-09-04: 10 mL

## 2020-09-04 MED ORDER — ONDANSETRON HCL 4 MG/2ML IJ SOLN: 4.0000 mg | Freq: Once | INTRAMUSCULAR | Status: DC | PRN

## 2020-09-04 MED ORDER — HYDROMORPHONE HCL 1 MG/ML IJ SOLN
1.0000 mg | INTRAMUSCULAR | Status: DC | PRN
  Administered 2020-09-04 – 2020-09-05 (×6): 1 mg via INTRAVENOUS
  Filled 2020-09-04 (×7): qty 1

## 2020-09-04 MED ORDER — SODIUM CHLORIDE 0.9% FLUSH
3.0000 mL | INTRAVENOUS | Status: DC | PRN
  Administered 2020-09-16 – 2020-09-28 (×5): 3 mL via INTRAVENOUS

## 2020-09-04 MED ORDER — PROPOFOL 500 MG/50ML IV EMUL
INTRAVENOUS | Status: DC | PRN
  Administered 2020-09-04: 50 ug/kg/min via INTRAVENOUS

## 2020-09-04 MED ORDER — VANCOMYCIN HCL 1250 MG/250ML IV SOLN
1250.0000 mg | INTRAVENOUS | Status: DC
  Filled 2020-09-04: qty 250

## 2020-09-04 MED ORDER — PHENYLEPHRINE HCL-NACL 10-0.9 MG/250ML-% IV SOLN
INTRAVENOUS | Status: DC | PRN
  Administered 2020-09-04: 25 ug/min via INTRAVENOUS

## 2020-09-04 MED ORDER — ACETAMINOPHEN 650 MG RE SUPP
650.0000 mg | RECTAL | Status: DC | PRN
  Filled 2020-09-04: qty 1

## 2020-09-04 MED ORDER — BACITRACIN ZINC 500 UNIT/GM EX OINT
TOPICAL_OINTMENT | CUTANEOUS | Status: AC
  Filled 2020-09-04: qty 28.35

## 2020-09-04 MED ORDER — LIDOCAINE-EPINEPHRINE 1 %-1:100000 IJ SOLN
INTRAMUSCULAR | Status: AC
  Filled 2020-09-04: qty 1

## 2020-09-04 MED ORDER — BUPIVACAINE-EPINEPHRINE 0.5% -1:200000 IJ SOLN
INTRAMUSCULAR | Status: DC | PRN
  Administered 2020-09-04: 10 mL

## 2020-09-04 MED ORDER — ACETAMINOPHEN 325 MG PO TABS
650.0000 mg | ORAL_TABLET | ORAL | Status: DC | PRN
  Administered 2020-09-09 – 2020-09-10 (×3): 650 mg via ORAL
  Filled 2020-09-04 (×3): qty 2

## 2020-09-04 SURGICAL SUPPLY — 72 items
BAG BANDED W/RUBBER/TAPE 36X54 (MISCELLANEOUS) ×4 IMPLANT
BAG EQP BAND 135X91 W/RBR TAPE (MISCELLANEOUS) ×2
BAND INSRT 18 STRL LF DISP RB (MISCELLANEOUS) ×2
BAND RUBBER #18 3X1/16 STRL (MISCELLANEOUS) ×4 IMPLANT
BASKET BONE COLLECTION (BASKET) IMPLANT
BUR CARBIDE MATCH 3.0 (BURR) ×2 IMPLANT
CARTRIDGE OIL MAESTRO DRILL (MISCELLANEOUS) ×1 IMPLANT
CNTNR URN SCR LID CUP LEK RST (MISCELLANEOUS) ×1 IMPLANT
CONT SPEC 4OZ STRL OR WHT (MISCELLANEOUS) ×2
COVER BACK TABLE 60X90IN (DRAPES) ×2 IMPLANT
COVER MAYO STAND STRL (DRAPES) ×4 IMPLANT
COVER WAND RF STERILE (DRAPES) ×2 IMPLANT
DECANTER SPIKE VIAL GLASS SM (MISCELLANEOUS) ×2 IMPLANT
DIFFUSER DRILL AIR PNEUMATIC (MISCELLANEOUS) ×2 IMPLANT
DRAPE C-ARMOR (DRAPES) ×2 IMPLANT
DRAPE LAPAROTOMY 100X72X124 (DRAPES) ×2 IMPLANT
DRAPE MICROSCOPE LEICA (MISCELLANEOUS) ×2 IMPLANT
DRAPE SURG 17X23 STRL (DRAPES) ×2 IMPLANT
DRSG OPSITE POSTOP 4X8 (GAUZE/BANDAGES/DRESSINGS) ×2 IMPLANT
DRSG PAD ABDOMINAL 8X10 ST (GAUZE/BANDAGES/DRESSINGS) ×2 IMPLANT
DURAPREP 26ML APPLICATOR (WOUND CARE) ×2 IMPLANT
ELECT BLADE INSULATED 4IN (ELECTROSURGICAL) ×2
ELECT REM PT RETURN 9FT ADLT (ELECTROSURGICAL) ×2
ELECTRODE BLADE INSULATED 4IN (ELECTROSURGICAL) ×1 IMPLANT
ELECTRODE REM PT RTRN 9FT ADLT (ELECTROSURGICAL) ×1 IMPLANT
EVACUATOR 1/8 PVC DRAIN (DRAIN) ×2 IMPLANT
FEE INTRAOP MONITOR IMPULS NCS (MISCELLANEOUS) ×1 IMPLANT
GAUZE 4X4 16PLY RFD (DISPOSABLE) IMPLANT
GAUZE SPONGE 4X4 12PLY STRL (GAUZE/BANDAGES/DRESSINGS) ×2 IMPLANT
GLOVE BIO SURGEON STRL SZ 6.5 (GLOVE) ×4 IMPLANT
GLOVE BIOGEL PI IND STRL 6.5 (GLOVE) ×2 IMPLANT
GLOVE BIOGEL PI IND STRL 8 (GLOVE) ×2 IMPLANT
GLOVE BIOGEL PI INDICATOR 6.5 (GLOVE) ×2
GLOVE BIOGEL PI INDICATOR 8 (GLOVE) ×2
GLOVE ECLIPSE 8.0 STRL XLNG CF (GLOVE) ×4 IMPLANT
GOWN STRL REUS W/ TWL LRG LVL3 (GOWN DISPOSABLE) ×2 IMPLANT
GOWN STRL REUS W/ TWL XL LVL3 (GOWN DISPOSABLE) ×1 IMPLANT
GOWN STRL REUS W/TWL 2XL LVL3 (GOWN DISPOSABLE) IMPLANT
GOWN STRL REUS W/TWL LRG LVL3 (GOWN DISPOSABLE) ×4
GOWN STRL REUS W/TWL XL LVL3 (GOWN DISPOSABLE) ×2
GUIDEWIRE EVEREST 1.4X620 2-PK (WIRE) ×2 IMPLANT
HEMOSTAT POWDER KIT SURGIFOAM (HEMOSTASIS) ×2 IMPLANT
INTRAOP MONITOR FEE IMPULS NCS (MISCELLANEOUS) ×1
INTRAOP MONITOR FEE IMPULSE (MISCELLANEOUS) ×2
KIT BASIN OR (CUSTOM PROCEDURE TRAY) ×2 IMPLANT
KIT POSITION SURG JACKSON T1 (MISCELLANEOUS) ×2 IMPLANT
KIT TURNOVER KIT B (KITS) ×2 IMPLANT
MARKER SKIN DUAL TIP RULER LAB (MISCELLANEOUS) ×4 IMPLANT
NEEDLE HYPO 25X1 1.5 SAFETY (NEEDLE) ×2 IMPLANT
NEEDLE RETRACTOR BT 8G (NEEDLE) ×4 IMPLANT
NS IRRIG 1000ML POUR BTL (IV SOLUTION) ×2 IMPLANT
OIL CARTRIDGE MAESTRO DRILL (MISCELLANEOUS) ×2
PACK LAMINECTOMY NEURO (CUSTOM PROCEDURE TRAY) ×2 IMPLANT
PAD ARMBOARD 7.5X6 YLW CONV (MISCELLANEOUS) IMPLANT
PATTIES SURGICAL .5 X.5 (GAUZE/BANDAGES/DRESSINGS) IMPLANT
PATTIES SURGICAL .5 X1 (DISPOSABLE) IMPLANT
PATTIES SURGICAL 1X1 (DISPOSABLE) IMPLANT
ROD CONT BN 5.5X70 (Rod) ×4 IMPLANT
SCREW CANN 55X6.5XPA (Screw) ×4 IMPLANT
SCREW CANN FENS 6.5X55 (Screw) ×8 IMPLANT
SET SCREW (Screw) ×8 IMPLANT
SET SCREW VRST (Screw) ×4 IMPLANT
SPONGE LAP 4X18 RFD (DISPOSABLE) IMPLANT
STAPLER VISISTAT 35W (STAPLE) ×2 IMPLANT
STRIP BIOACTIVE VITOSS 25X52X4 (Orthopedic Implant) ×2 IMPLANT
SUT VIC AB 0 CT1 18XCR BRD8 (SUTURE) ×1 IMPLANT
SUT VIC AB 0 CT1 8-18 (SUTURE) ×2
SUT VIC AB 2-0 CP2 18 (SUTURE) ×4 IMPLANT
TOWEL GREEN STERILE (TOWEL DISPOSABLE) IMPLANT
TOWEL GREEN STERILE FF (TOWEL DISPOSABLE) IMPLANT
TRAY FOLEY MTR SLVR 16FR STAT (SET/KITS/TRAYS/PACK) IMPLANT
WATER STERILE IRR 1000ML POUR (IV SOLUTION) ×2 IMPLANT

## 2020-09-04 NOTE — Progress Notes (Signed)
   Providing Compassionate, Quality Care - Together  NEUROSURGERY PROGRESS NOTE   S: No issues overnight.   O: EXAM:  BP (!) 157/88 (BP Location: Left Arm)   Pulse 72   Temp 98.5 F (36.9 C) (Oral)   Resp 17   Ht 6\' 7"  (2.007 m)   Wt 95.3 kg   SpO2 98%   BMI 23.67 kg/m   Awake alert oriented x3 Face symmetric, PERRLA EOMI Bilateral upper extremity 4+/5 motor strength Left lower extremity DF/PF 4/5, KE/HF 2/5 Right lower extremity BKA, KE/HF 3/5 Decrease sensation light touch in left greater than right lower extremity Bilateral patellar reflexes decreased, no clonus left lower extremity   Impression/Assessment: 69 year old male with  1.T10/11 cord compression with cord signal change -Due to previous discitis/epidural abscess with erosion of T11 vertebral body and likely instability at this level  PLAN: -OR today, all risks and benefits of surgery discussed with the patient and all questions answered.  -npo -will send intraop cultures   Thank you for allowing me to participate in this patient's care.  Please do not hesitate to call with questions or concerns.   Elwin Sleight, [facility] Cell: [phone]

## 2020-09-04 NOTE — Progress Notes (Signed)
Orthopedic Tech Progress Note Patient Details:  Thomas [name] 01-23-1951 [id number] Called in brace Patient ID: [name], male   DOB: Dec 31, 1950, 69 y.o.   MRN: [id number]   [name] 09/04/2020, 11:04 PM

## 2020-09-04 NOTE — Plan of Care (Signed)
Patient is alert and oriented x4. Patient has lower back pain, patient is receiving prn IV dilaudid for pain with some relief. Patient is NPO for surgery today. Problem: Education: Goal: Knowledge of General Education information will improve Description: Including pain rating scale, medication(s)/side effects and non-pharmacologic comfort measures Outcome: Progressing   Problem: Health Behavior/Discharge Planning: Goal: Ability to manage health-related needs will improve Outcome: Progressing   Problem: Clinical Measurements: Goal: Ability to maintain clinical measurements within normal limits will improve Outcome: Progressing Goal: Will remain free from infection Outcome: Progressing Goal: Diagnostic test results will improve Outcome: Progressing Goal: Respiratory complications will improve Outcome: Progressing Goal: Cardiovascular complication will be avoided Outcome: Progressing   Problem: Activity: Goal: Risk for activity intolerance will decrease Outcome: Progressing   Problem: Nutrition: Goal: Adequate nutrition will be maintained Outcome: Progressing   Problem: Coping: Goal: Level of anxiety will decrease Outcome: Progressing   Problem: Elimination: Goal: Will not experience complications related to bowel motility Outcome: Progressing Goal: Will not experience complications related to urinary retention Outcome: Progressing   Problem: Pain Managment: Goal: General experience of comfort will improve Outcome: Progressing   Problem: Safety: Goal: Ability to remain free from injury will improve Outcome: Progressing   Problem: Skin Integrity: Goal: Risk for impaired skin integrity will decrease Outcome: Progressing

## 2020-09-04 NOTE — Op Note (Signed)
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 Pieter Partridge C. Mekala Winger, DO  ASSISTANT: None  ANESTHESIA: General Endotracheal  EBL: 300cc  SPECIMENS: T10-11 disc materal, sent for pathology and microbiology  DRAINS: Medium hmv  COMPLICATIONS: None  CONDITION: Stable  HISTORY: Thomas [name] is a 69 y.o. male with a longstanding history of epidural abscess and T10-11 osteodiscitis.  He has failed multiple conservative treatments consisting of long-term IV antibiotics in which he has had recurrent epidural abscesses and had been on multiple long-term antibiotic therapies.  He went to the hospital due to worsening back pain, numbness and tingling in his lower extremities as well as difficulty urinating, and felt a pop in his back at an outside facility while transitioning onto an MRI table.  Work-up revealed that his T10-11 osteodiscitis had continue to collapse and cause cord compression with cord signal change compared to previous MRI.  Due to this finding I recommended surgical decompression and fusion given his cord signal change, numbness and tingling in his legs.  I discussed with him the risks and benefits of surgery and he agreed to proceed.  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  Therefore I discussed with  him that he will need decompression at T10-11 and instrumentation at T10 and T12 as the T11 vertebral body erosion from his infection is quite significant and could not tolerate instrumentation.  PROCEDURE IN DETAIL: The patient was brought to the operating room. After induction of general anesthesia, the patient was positioned on the operative table in the prone position. All pressure points were meticulously padded.  Preoperative SSEPs were obtained and of note was no monitorable signals in the lower extremities.  He does have a right BKA.  All attempts were made to optimize SSEPs however in the lower extremities they could not be technically monitorable.  He did have monitorable signals in the upper extremities.  Skin incision was then marked out and prepped and draped in the usual sterile fashion, localized with lateral C arm over the T10-T12 spinous processes.  Using a 10 blade skin incision was made in midline.  Using Bovie electrocautery, dissection to the thoracolumbar fascia was performed.  Subperiosteal dissection was performed with Bovie electrocautery and Cobb elevators.  Self-retaining retractors were placed in the wound.  A lateral x-ray was performed to confirm the level of T10.  Hemostasis was achieved in the soft tissues with bipolar cautery.  The dissection was continued with Bovie electrocautery to expose the T10 TP to the T12 TPs.  There was significant scarring noted and some inflammatory tissue along the T10-11 facets.  Using AP x-ray, bilateral T10 pedicles were accessed with Jamshidi needles with care not to violate the medial border of the pedicle.  K wires were placed into the Jamshidi, the Jamshidi was removed and pedicle screws were placed over the K wires and the K wires were removed.  Similar technique was performed for pedicle screw placement at T12  bilaterally.  Using lateral x-ray and AP x-ray, images confirmed adequate placement of pedicle instrumentation.  At this point the  microscope was draped and brought into the sterile field for decompression of neural elements.  A bilateral laminectomy was performed at T10 with a high-speed drill.  A superior, bilateral T11 laminectomy was performed with a high-speed drill until the epidural space was encountered.  There was noted to be significant scarring of the ligamentum flavum and bilateral facet joints at this level.  A complete left facetectomy was performed with a high-speed drill.  The ligamentum flavum was then carefully dissected off bilaterally using microcurette's and resected with Kerrison rongeurs.  There is some significant adherence laterally along the facet joints to the thecal sac.  The scarring was carefully dissected freely with microcurette's from the thecal sac.  At this point the thecal sac was noted to be pulsatile and adequately decompressed.  The left T10 nerve root was decompressed with microcurette's from significant epidural scarring and phlegmon.  Using the high-speed drill a small amount of the superior left T11 pedicle was resected in order to gain access to the T10-11 disc base.  Epidural hemostasis was achieved with bipolar cautery.  The disc space was incised and using small curettes the phlegmon disc material was partially resected, this was sent for pathology and microbiology.  Using micro curettes the ventral epidural space was carefully dissected and using small downgoing curettes the posterior portion of the disc base was tamped ventrally.  The disc base was clearly old osteodiscitis phlegmonous material.  Using micro dissectors and Murphy ball dissectors the ventral epidural space was felt to be decompressed superiorly and inferiorly at this level.  Epidural hemostasis was achieved with Surgifoam and cottonoids.  The allograft was divided and a small amount was placed into the disc space with vancomycin powder.  The microscope was then removed.  The T10 right pars and remaining facet was decorticated,  the remaining T11 lamina was decorticated bilaterally and the T12 lamina was decorticated bilaterally using a high-speed drill.  Appropriate sized rods were placed into the pedicle screw tulips bilaterally after appropriate contouring to the thoracic kyphosis.  Setscrews were placed and final tightened to the manufacturer's recommendations.  Allograft was placed posterior laterally along the instrumentation.  A medium Hemovac was tunneled laterally and placed in the epidural space.  Self-retaining retractor was removed, hemostasis was achieved with bipolar cautery and Surgifoam.  Wound was then closed in layers, muscle and fascia with 0 Vicryl sutures.  The skin was closed with 2-0 Vicryl sutures and staples.  Sterile dressing was applied.  At the end of the case all sponge, needle, and instrument counts were correct. The patient was then transferred to the stretcher, extubated, and taken to the post-anesthesia care unit in stable hemodynamic condition.

## 2020-09-04 NOTE — Plan of Care (Signed)

## 2020-09-04 NOTE — Progress Notes (Signed)
Pharmacy Antibiotic Note  Thomas [name] is a 69 y.o. male admitted on 08/29/2020 s/p spinal cord decompression.  Pharmacy has been consulted for vancomycin dosing for surgical prophylaxis (drain in place; hemovac) -SCr= 2.24, CrCl ~ 40 -vancomycin 1000mg  given ~ 1pm today -blood cultures- negative -intra-op cultures collected 11/16  Plan: -Vancomycin 1250mg  IV q24h -Will follow renal function, cultures and clinical progress   Height: 6\' 7"  (200.7 cm) Weight: 95.3 kg (210 lb 1.6 oz) IBW/kg (Calculated) : 93.7  Temp (24hrs), Avg:98.2 F (36.8 C), Min:97.8 F (36.6 C), Max:98.5 F (36.9 C)  Recent Labs  Lab 08/31/20 0248 09/01/20 0900 09/02/20 0817 09/03/20 0238 09/04/20 0433  WBC 7.5 7.6 7.9 9.6 8.8  CREATININE 1.77* 2.04* 2.08* 2.44* 2.24*    Estimated Creatinine Clearance: 42.4 mL/min (A) (by C-G formula based on SCr of 2.24 mg/dL (H)).    Allergies  Allergen Reactions  . Other Other (See Comments)    Blood pressure issues Per [facility] hospital: Pt states he can only take these pain meds or else he gets very sick: Oxycontin,morphine,demerol, and dilaudid are the only pain meds pt states he can take.    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 2.4.2021: He also reports that he currently take Oxycontin.   Marland Kitchen Oxymorphone Other (See Comments)    Causes kidney problems  . Aspirin Nausea And Vomiting    Per [facility]  . Beta Vulgaris Nausea And Vomiting    Beets  . Buspirone Other (See Comments)    unknown  . Cabbage Nausea And Vomiting  . Codeine Other (See Comments)    unknown  . Dolobid [Diflunisal]     Unknown reaction  . Fish Allergy Nausea And Vomiting  . Fish-Derived Products Nausea And Vomiting  . Methadone     Unknown reaction  . Pentazocine Other (See Comments)    Unknown reaction  . Propoxyphene Other (See Comments)    Unknown reaction  . Shellfish Allergy Nausea And  Vomiting  . Sulfa Antibiotics Hives  . Sulfasalazine Other (See Comments)    Unknown reaction  . Vistaril [Hydroxyzine]     Unknown reaction  . Amoxicillin Rash    Per [place] [Acetaminophen] Rash    Thank you for allowing pharmacy to be a part of this patient's care.  Hildred Laser, PharmD Clinical Pharmacist **Pharmacist phone directory can now be found on [email or web address] ([email or web address]).  Listed under [place].

## 2020-09-04 NOTE — Anesthesia Preprocedure Evaluation (Addendum)
Anesthesia Evaluation  Patient identified by MRN, date of birth, ID band Patient awake    Reviewed: Allergy & Precautions, NPO status , Patient's Chart, lab work & pertinent test results, reviewed documented beta blocker date and time   History of Anesthesia Complications Negative for: history of anesthetic complications  Airway Mallampati: II  TM Distance: >3 FB Neck ROM: Full    Dental  (+) Edentulous Upper, Edentulous Lower   Pulmonary pneumonia, unresolved,    + rhonchi  + wheezing      Cardiovascular hypertension, Pt. on home beta blockers and Pt. on medications +CHF  Normal cardiovascular exam     Neuro/Psych  T10/11 cord compression  negative psych ROS   GI/Hepatic GERD  ,(+)     substance abuse  ,   Endo/Other  diabetesHypothyroidism   Renal/GU CRFRenal disease     Musculoskeletal  (+) Arthritis , narcotic dependent Gout   Abdominal   Peds  Hematology  (+) anemia ,   Anesthesia Other Findings Chronic pain syndrome Covid+ pneumonia (diagnosed early 07/2020), residual cough with phlegm, no current oxygen requirements    Reproductive/Obstetrics                            Anesthesia Physical Anesthesia Plan  ASA: IV  Anesthesia Plan: General   Post-op Pain Management:    Induction: Intravenous  PONV Risk Score and Plan: 3 and Treatment may vary due to age or medical condition, Ondansetron and Dexamethasone  Airway Management Planned: Oral ETT  Additional Equipment: None  Intra-op Plan:   Post-operative Plan: Possible Post-op intubation/ventilation  Informed Consent: I have reviewed the patients History and Physical, chart, labs and discussed the procedure including the risks, benefits and alternatives for the proposed anesthesia with the patient or authorized representative who has indicated his/her understanding and acceptance.     Dental advisory  given  Plan Discussed with: CRNA and Anesthesiologist  Anesthesia Plan Comments:       Anesthesia Quick Evaluation

## 2020-09-04 NOTE — Anesthesia Procedure Notes (Signed)
Procedure Name: Intubation Performed by: Milford Cage, CRNA Pre-anesthesia Checklist: Patient identified, Emergency Drugs available, Suction available and Patient being monitored Patient Re-evaluated:Patient Re-evaluated prior to induction Oxygen Delivery Method: Circle System Utilized Preoxygenation: Pre-oxygenation with 100% oxygen Induction Type: IV induction Laryngoscope Size: Glidescope and 4 Grade View: Grade I Tube type: Oral Tube size: 7.5 mm Number of attempts: 1 Airway Equipment and Method: Stylet and Oral airway Placement Confirmation: ETT inserted through vocal cords under direct vision,  positive ETCO2 and breath sounds checked- equal and bilateral Secured at: 25 cm Tube secured with: Tape Dental Injury: Teeth and Oropharynx as per pre-operative assessment  Comments: Elective GS

## 2020-09-04 NOTE — Transfer of Care (Signed)
Immediate Anesthesia Transfer of Care Note  Patient: Thomas [name]  Procedure(s) Performed: Thoracic ten - Thoracic twelve Open Fusion (N/A Back)  Patient Location: PACU  Anesthesia Type:General  Level of Consciousness: drowsy  Airway & Oxygen Therapy: Patient Spontanous Breathing  Post-op Assessment: Report given to RN and Post -op Vital signs reviewed and stable  Post vital signs: Reviewed and stable  Last Vitals:  Vitals Value Taken Time  BP 129/105 09/04/20 1756  Temp    Pulse 94 09/04/20 1757  Resp 32 09/04/20 1757  SpO2 97 % 09/04/20 1757  Vitals shown include unvalidated device data.  Last Pain:  Vitals:   09/04/20 1251  TempSrc:   PainSc: 7       Patients Stated Pain Goal: 2 (14/48/18 5631)  Complications: No complications documented.

## 2020-09-04 NOTE — Progress Notes (Signed)
   Subjective:  Patient evaluated at bedside this AM. He is in resting comfortably in bed and is in good spirits for his surgery today. He is hopeful that this surgery will help with his back pain.   Objective:  Vital signs in last 24 hours: Vitals:   09/03/20 1114 09/03/20 1416 09/03/20 2003 09/04/20 0328  BP: (!) 145/87 132/81 (!) 157/78 (!) 146/89  Pulse: 86 80 85 74  Resp: 18 17 18 18   Temp: 98.9 F (37.2 C) 98.9 F (37.2 C) 98.7 F (37.1 C) 98 F (36.7 C)  TempSrc: Oral Oral    SpO2: 97% 96% 96% 94%  Weight:      Height:       Physical Exam: General: Resting in bed, no acute distress Neuro: Awake, alert, oriented x3. Moving all four extremities. Psych:  Cheerful mood, normal affect. Normal speech.   Assessment/Plan: Thomas [name] is 69yo male (he/him) with prior CVA, CKD IV, type 2 diabetes mellitus, gout, chronic back pain s/p trauma, multiple spinal surgeries, chronic osteomyelitis T10-T12 admitted 11/11 for acute on chronic back pain following injury at OSH scheduled for spinal decompression surgery today with Dr. Reatha Armour.  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 Per chart had large BM earlier today. MRSA PCR positive, receiving vancomycin pre-op. Plan for spinal decompression surgery today with Dr. Reatha Armour. Will re-assess pain medication post-op, continue with current bowel regimen. Plan to follow-up aspirate cultures for further antibiotic coverage.  - Spinal cord decompression today - Re-start diet post-op - Pain  regimen: dilaudid 1mg  q4 PRN, home robaxin, gabapentin - Bowel regimen: Senokot-S BID, Miralax qd - F/u aspirate cultures  #Acute urinary retention #Bacteruria UOP appropriate, UCx NGTD x4d. Plan to continue Foley, will consider renal u/s to assess hydronephrosis and voiding trial s/p surgery. - Foley for bladder compression - C/w home finasteride, tamsulosin  #Chronic kidney disease stage IV sCr 2.24 < 2.44. Given 1/2 L bolus yesterday with appropriate response. Will continue to trend RFP and encourage po post-op. - Trend RFP - Avoid nephrotoxic meds  #Type 2 diabetes mellitus CBG's 100-140 over last 24hr. Will continue CBG monitoring and titrate insulin as needed. Will re-start diet post-op. - C/w SSI - CBG monitoring   DIET: Regular IVF: n/a DVT PPX: subQ heparin BOWEL: Senakot, Miralax CODE: FULL FAM COM: n/a  Prior to Admission Living Arrangement: Home Anticipated Discharge Location: Home v SNF Barriers to Discharge: medical and surgical management Dispo: Anticipated discharge in approximately 3 day(s).   [name], MD 09/04/2020, 6:49 AM Pager: [phone] After 5pm on weekdays and 1pm on weekends: On Call pager [phone]

## 2020-09-04 NOTE — Progress Notes (Signed)
   Providing Compassionate, Quality Care - Together  NEUROSURGERY PROGRESS NOTE   S: Patient seen and examined in PACU, no new complaints  O: EXAM:  BP (!) 151/86 (BP Location: Left Arm)   Pulse 96   Temp 98.5 F (36.9 C)   Resp (!) 24   Ht 6\' 7"  (2.007 m)   Wt 95.3 kg   SpO2 94%   BMI 23.67 kg/m   Awake, alert Bilateral upper extremities full strength Right lower extremity, BKA, strength 4/5 HF/KE/KF Left lower extremity 4/5 throughout Sensory intact light touch Incision clean dry intact Hemovac in place  ASSESSMENT:  69 y.o. male with   1.T10/11 cord compression with cord signal change -Due to previous discitis/epidural abscess with erosion of T11 vertebral body and likely instability at this level  -Status post T10-12 open decompression and fusion on 09/04/2020  There was clear osteodiscitis at T10-11, Intra-Op pathology/microbiology was sent  PLAN: -PT/OT -TLSO when out of bed -Pain control -Consult ID for long-term antibiotics -DVT prophylaxis (subcu heparin)   Thank you for allowing me to participate in this patient's care.  Please do not hesitate to call with questions or concerns.   Elwin Sleight, [facility] Cell: [phone]

## 2020-09-04 NOTE — Progress Notes (Signed)
1950 Patient arrived from PACU via bed. SCD to left leg and continuous pulse ox applied to patient. IVF infusing and pain medication given. Honeycomb dsg to mid upper back clean dry and intact with hemovac drain in place. Foley still in place - made patient aware that we will remove in AM and he understands. Will continue to monitor and do post op assessments per protocol.

## 2020-09-04 NOTE — Progress Notes (Signed)
PT Cancellation Note  Patient Details Name: JONANTHAN BOLENDER MRN: [id number] DOB: June 24, 1951   Cancelled Treatment:    Reason Eval/Treat Not Completed: (P) Patient at procedure or test/unavailable (Pt has decompression sx scheduled for 10AM.)   Carlene Coria 09/04/2020, 9:31 AM

## 2020-09-05 DIAGNOSIS — S24103A Unspecified injury at T7-T10 level of thoracic spinal cord, initial encounter: Secondary | ICD-10-CM | POA: Diagnosis not present

## 2020-09-05 DIAGNOSIS — N184 Chronic kidney disease, stage 4 (severe): Secondary | ICD-10-CM | POA: Diagnosis not present

## 2020-09-05 DIAGNOSIS — M4624 Osteomyelitis of vertebra, thoracic region: Secondary | ICD-10-CM | POA: Diagnosis not present

## 2020-09-05 DIAGNOSIS — M4804 Spinal stenosis, thoracic region: Secondary | ICD-10-CM

## 2020-09-05 LAB — BASIC METABOLIC PANEL
Anion gap: 8 (ref 5–15)
Anion gap: 10 (ref 5–15)
BUN: 30 mg/dL — ABNORMAL HIGH (ref 8–23)
BUN: 32 mg/dL — ABNORMAL HIGH (ref 8–23)
CO2: 22 mmol/L (ref 22–32)
CO2: 20 mmol/L — ABNORMAL LOW (ref 22–32)
Calcium: 9 mg/dL (ref 8.9–10.3)
Calcium: 9 mg/dL (ref 8.9–10.3)
Chloride: 108 mmol/L (ref 98–111)
Chloride: 105 mmol/L (ref 98–111)
Creatinine, Ser: 2.14 mg/dL — ABNORMAL HIGH (ref 0.61–1.24)
Creatinine, Ser: 2.12 mg/dL — ABNORMAL HIGH (ref 0.61–1.24)
GFR, Estimated: 33 mL/min — ABNORMAL LOW (ref >60)
GFR, Estimated: 33 mL/min — ABNORMAL LOW (ref >60)
Glucose, Bld: 122 mg/dL — ABNORMAL HIGH (ref 70–99)
Glucose, Bld: 119 mg/dL — ABNORMAL HIGH (ref 70–99)
Potassium: 6 mmol/L — ABNORMAL HIGH (ref 3.5–5.1)
Potassium: 4.5 mmol/L (ref 3.5–5.1)
Sodium: 138 mmol/L (ref 135–145)
Sodium: 135 mmol/L (ref 135–145)

## 2020-09-05 LAB — CBC
HCT: 29.9 % — ABNORMAL LOW (ref 39.0–52.0)
Hemoglobin: 9.5 g/dL — ABNORMAL LOW (ref 13.0–17.0)
MCH: 30.6 pg (ref 26.0–34.0)
MCHC: 31.8 g/dL (ref 30.0–36.0)
MCV: 96.5 fL (ref 80.0–100.0)
Platelets: 313 10*3/uL (ref 150–400)
RBC: 3.1 MIL/uL — ABNORMAL LOW (ref 4.22–5.81)
RDW: 16.6 % — ABNORMAL HIGH (ref 11.5–15.5)
WBC: 9.7 10*3/uL (ref 4.0–10.5)
nRBC: 0 % (ref 0.0–0.2)

## 2020-09-05 LAB — GLUCOSE, CAPILLARY
Glucose-Capillary: 106 mg/dL — ABNORMAL HIGH (ref 70–99)
Glucose-Capillary: 123 mg/dL — ABNORMAL HIGH (ref 70–99)
Glucose-Capillary: 131 mg/dL — ABNORMAL HIGH (ref 70–99)
Glucose-Capillary: 171 mg/dL — ABNORMAL HIGH (ref 70–99)
Glucose-Capillary: 94 mg/dL (ref 70–99)
Glucose-Capillary: 96 mg/dL (ref 70–99)

## 2020-09-05 MED ORDER — SODIUM CHLORIDE 0.9 % IV SOLN
2.0000 g | INTRAVENOUS | Status: DC
  Administered 2020-09-05 – 2020-09-20 (×16): 2 g via INTRAVENOUS
  Filled 2020-09-05 (×17): qty 20

## 2020-09-05 MED ORDER — RIFAMPIN 300 MG PO CAPS
300.0000 mg | ORAL_CAPSULE | Freq: Two times a day (BID) | ORAL | Status: DC
  Administered 2020-09-06 – 2020-10-03 (×55): 300 mg via ORAL
  Filled 2020-09-05 (×56): qty 1

## 2020-09-05 MED ORDER — METHOCARBAMOL 500 MG PO TABS
500.0000 mg | ORAL_TABLET | Freq: Three times a day (TID) | ORAL | Status: DC
  Administered 2020-09-05 – 2020-09-06 (×6): 500 mg via ORAL
  Filled 2020-09-05 (×6): qty 1

## 2020-09-05 MED ORDER — VANCOMYCIN HCL 1250 MG/250ML IV SOLN
1250.0000 mg | INTRAVENOUS | Status: DC
  Filled 2020-09-05: qty 250

## 2020-09-05 MED ORDER — HYDROMORPHONE HCL 1 MG/ML IJ SOLN
1.0000 mg | INTRAMUSCULAR | Status: DC | PRN
  Administered 2020-09-05: 1 mg via INTRAVENOUS
  Administered 2020-09-05 – 2020-09-06 (×5): 2 mg via INTRAVENOUS
  Administered 2020-09-06: 1 mg via INTRAVENOUS
  Administered 2020-09-07 (×2): 2 mg via INTRAVENOUS
  Filled 2020-09-05: qty 1
  Filled 2020-09-05: qty 2
  Filled 2020-09-05 (×2): qty 1
  Filled 2020-09-05 (×6): qty 2

## 2020-09-05 MED ORDER — SODIUM ZIRCONIUM CYCLOSILICATE 10 G PO PACK
10.0000 g | PACK | Freq: Every day | ORAL | Status: DC
  Administered 2020-09-05 – 2020-09-06 (×2): 10 g via ORAL
  Filled 2020-09-05 (×2): qty 1

## 2020-09-05 MED ORDER — SODIUM CHLORIDE 0.9 % IV SOLN
1000.0000 mg | Freq: Every day | INTRAVENOUS | Status: DC
  Administered 2020-09-05 – 2020-09-19 (×15): 1000 mg via INTRAVENOUS
  Filled 2020-09-05 (×17): qty 20

## 2020-09-05 MED FILL — Thrombin For Soln 5000 Unit: CUTANEOUS | Qty: 5000 | Status: AC

## 2020-09-05 NOTE — Care Management Important Message (Signed)
Important Message  Patient Details  Name: Thomas [name] MRN: [id number] Date of Birth: Sep 07, 1951   Medicare Important Message Given:  Yes     [name] 09/05/2020, 3:29 PM

## 2020-09-05 NOTE — Progress Notes (Signed)
   Providing Compassionate, Quality Care - Together  NEUROSURGERY PROGRESS NOTE   S: No issues overnight. Numbness and tingling in LLE proximally improved  O: EXAM:  BP (!) 164/90 (BP Location: Left Arm)   Pulse 77   Temp 97.7 F (36.5 C) (Oral)   Resp 16   Ht 6\' 7"  (2.007 m)   Wt 96.2 kg   SpO2 98%   BMI 23.89 kg/m   Awake, alert Bilateral upper extremities full strength Right lower extremity, BKA, strength 4/5 HF/KE/KF Left lower extremity 4/5 throughout Sensory intact light touch Incision clean dry intact Hemovac in place  ASSESSMENT:  69 y.o. male with   1.T10/11 cord compression with cord signal change -Due to previous discitis/epidural abscess with erosion of T11 vertebral body and likely instability at this level  -Status post T10-12 open decompression and fusion on 09/04/2020  There was clear osteodiscitis at T10-11, Intra-Op pathology/microbiology was sent, no frank abscess noted  PLAN: -PT/OT -TLSO when out of bed -Pain control -Consult ID for long-term antibiotics -DVT prophylaxis (subcu heparin) -Monitor Hemovac, can DC drain tomorrow  Thank you for allowing me to participate in this patient's care.  Please do not hesitate to call with questions or concerns.   Elwin Sleight, [facility] Cell: [phone]

## 2020-09-05 NOTE — Progress Notes (Signed)
Occupational Therapy Re-Evaluation Patient Details Name: Thomas [name] MRN: [id number] DOB: 06-Jun-1951 Today's Date: 09/05/2020    History of Present Illness 69 year old person living with a right below-knee amputation, chronic pain syndrome due to a complicated history of thoracic spine infections and degenerative disease, deconditioning after recent Covid infection and comorbidities admitted to our service for medical managements of another episode of chronic thoracic osteomyelitis and discitis with degenerative changes causing spinal cord compression. On 11/16, pt underwent surgical decompression around T11, fusion, and disc aspiration for microbiology   Clinical Impression   Pt seen today for re-evaluation s/p surgical interventions noted above. Pt able to demonstrate rolling in bed without assistance, but declined further mobility secondary to increased pain levels. Pt educated on spinal precautions and brace wear, but only able to recall 2/3 precautions after education. Handout provided for improved carryover. Pt also noted with significant weakness and decreased sensation of L LE posing high fall risk. Pt requires Min A for UB ADLs and Max A for LB ADLs. Recommend DC to SNF for rehab to maximize independence. Plan to progress EOB activities and trial scoot transfers as appropriate.     Follow Up Recommendations  SNF    Equipment Recommendations  None recommended by OT    Recommendations for Other Services       Precautions / Restrictions Precautions Precautions: Fall Precaution Comments: hx of R BKA Required Braces or Orthoses: Spinal Brace Spinal Brace: Thoracolumbosacral orthotic Restrictions Weight Bearing Restrictions: No      Mobility Bed Mobility Overal bed mobility: Needs Assistance Bed Mobility: Rolling Rolling: Supervision         General bed mobility comments: Supervision for rolling side to side, cued for log rolling and back precautions. Pt noted to lift  UE above head to pull self up - once again reminded to avoid overhead pulling to promote optimal spine healing. Pt refused to attempt EOB or OOB attempts today due to pain    Transfers                 General transfer comment: pt declined due to pain    Balance                                           ADL either performed or assessed with clinical judgement   ADL Overall ADL's : Needs assistance/impaired Eating/Feeding: Set up;Sitting   Grooming: Set up;Sitting   Upper Body Bathing: Minimal assistance;Sitting   Lower Body Bathing: Maximal assistance;Sitting/lateral leans   Upper Body Dressing : Set up;Sitting   Lower Body Dressing: Maximal assistance;Sitting/lateral leans       Toileting- Clothing Manipulation and Hygiene: Total assistance;Bed level         General ADL Comments: Pt limited by back pain. Weak L LE and decreased sensation pose increased fall risk for OOB attempts     Vision Baseline Vision/History: Wears glasses Wears Glasses: At all times Patient Visual Report: No change from baseline Vision Assessment?: No apparent visual deficits     Perception     Praxis      Pertinent Vitals/Pain Pain Assessment: Faces Faces Pain Scale: Hurts even more Pain Location: Low Back  Pain Descriptors / Indicators: Aching;Sore Pain Intervention(s): Limited activity within patient's tolerance;Monitored during session;Premedicated before session;Repositioned     Hand Dominance Right   Extremity/Trunk Assessment Upper Extremity Assessment Upper Extremity Assessment: Overall [facility]  for tasks assessed   Lower Extremity Assessment Lower Extremity Assessment: Defer to PT evaluation   Cervical / Trunk Assessment Cervical / Trunk Assessment: Normal   Communication Communication Communication: No difficulties   Cognition Arousal/Alertness: Awake/alert Behavior During Therapy: WFL for tasks assessed/performed Overall Cognitive Status: No  family/caregiver present to determine baseline cognitive functioning Area of Impairment: Orientation;Memory;Problem solving;Safety/judgement                 Orientation Level: Time   Memory: Decreased short-term memory;Decreased recall of precautions   Safety/Judgement: Decreased awareness of safety;Decreased awareness of deficits   Problem Solving: Slow processing;Requires verbal cues General Comments: Pt with poor awareness of deficits or spinal precautions. Impulsive and talks over therapists   General Comments  Provided handout for spinal precautions and education on brace use. Pt able to recall 2/3 precautions after education.     Exercises     Shoulder Instructions      Home Living Family/patient expects to be discharged to:: Skilled nursing facility                                 Additional Comments: Is at rehab at this time from [facility]      Prior Functioning/Environment Level of Independence: Needs assistance        Comments: Pt reports previously using cane and crutch, has prothetic LE. Today, pt reports he has not been able to stand since october 2021.        OT Problem List: Decreased strength;Decreased knowledge of use of DME or AE;Decreased knowledge of precautions;Decreased activity tolerance;Impaired balance (sitting and/or standing);Pain;Decreased safety awareness;Decreased cognition      OT Treatment/Interventions: Self-care/ADL training;Therapeutic exercise;Patient/family education;Balance training;Energy conservation;Therapeutic activities;DME and/or AE instruction    OT Goals(Current goals can be found in the care plan section) Acute Rehab OT Goals Patient Stated Goal: pain control, get stronger and get home OT Goal Formulation: With patient Time For Goal Achievement: 09/15/20 Potential to Achieve Goals: Good  OT Frequency: Min 2X/week   Barriers to D/C:            Co-evaluation PT/OT/SLP Co-Evaluation/Treatment:  Yes Reason for Co-Treatment: Necessary to address cognition/behavior during functional activity;For patient/therapist safety;To address functional/ADL transfers   OT goals addressed during session: ADL's and self-care      AM-PAC OT "6 Clicks" Daily Activity     Outcome Measure Help from another person eating meals?: A Little Help from another person taking care of personal grooming?: A Little Help from another person toileting, which includes using toliet, bedpan, or urinal?: A Lot Help from another person bathing (including washing, rinsing, drying)?: A Lot Help from another person to put on and taking off regular upper body clothing?: A Little Help from another person to put on and taking off regular lower body clothing?: A Lot 6 Click Score: 15   End of Session Nurse Communication: Mobility status  Activity Tolerance: Patient limited by pain Patient left: in bed;with call bell/phone within reach;with bed alarm set  OT Visit Diagnosis: Unsteadiness on feet (R26.81);Other abnormalities of gait and mobility (R26.89);Muscle weakness (generalized) (M62.81);Pain Pain - part of body:  (back)                Time: 1355-1416 OT Time Calculation (min): 21 min Charges:  OT General Charges $OT Visit: 1 Visit OT Evaluation $OT Re-eval: 1 Re-eval  Layla Maw, OTR/L  Layla Maw 09/05/2020, 2:44 PM

## 2020-09-05 NOTE — Progress Notes (Signed)
I went in the patient's room and made him aware that I was going to go ahead and remove his foley catheter this morning. He said he wanted to wait until after the doctor sees him because he was scared that he wasn't going to be able to urinate on his own. I will educate him on the importance of removing the catheter and will try to remove it before day shift begins.

## 2020-09-05 NOTE — Plan of Care (Signed)

## 2020-09-05 NOTE — Progress Notes (Addendum)
Pharmacy Antibiotic Note  Thomas [name] is a 69 y.o. male admitted on 08/29/2020 s/p spinal cord decompression.  Pharmacy has been consulted for daptomycin dosing for discitis/epidural abscess s/p debridement  Plan: -Daptomycin 1000mg  IV q24h -Ceftriaxone 2g IV q24h -Rifampin 300mg  PO q12h  -Will follow renal function, CK, cultures and clinical progress   Height: 6\' 7"  (200.7 cm) Weight: 96.2 kg (212 lb 1.3 oz) IBW/kg (Calculated) : 93.7  Temp (24hrs), Avg:98.1 F (36.7 C), Min:97.7 F (36.5 C), Max:98.5 F (36.9 C)  Recent Labs  Lab 09/01/20 0900 09/01/20 0900 09/02/20 0817 09/03/20 0238 09/04/20 0433 09/05/20 0232 09/05/20 1254  WBC 7.6  --  7.9 9.6 8.8 9.7  --   CREATININE 2.04*   < > 2.08* 2.44* 2.24* 2.14* 2.12*   < > = values in this interval not displayed.    Estimated Creatinine Clearance: 44.8 mL/min (A) (by C-G formula based on SCr of 2.12 mg/dL (H)).    Allergies  Allergen Reactions  . Other Other (See Comments)    Blood pressure issues Per [facility] hospital: Pt states he can only take these pain meds or else he gets very sick: Oxycontin,morphine,demerol, and dilaudid are the only pain meds pt states he can take.    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 2.4.2021: He also reports that he currently take Oxycontin.   Marland Kitchen Oxymorphone Other (See Comments)    Causes kidney problems  . Aspirin Nausea And Vomiting    Per [facility]  . Beta Vulgaris Nausea And Vomiting    Beets  . Buspirone Other (See Comments)    unknown  . Cabbage Nausea And Vomiting  . Codeine Other (See Comments)    unknown  . Dolobid [Diflunisal]     Unknown reaction  . Fish Allergy Nausea And Vomiting  . Fish-Derived Products Nausea And Vomiting  . Methadone     Unknown reaction  . Pentazocine Other (See Comments)    Unknown reaction  . Propoxyphene Other (See Comments)    Unknown reaction  .  Shellfish Allergy Nausea And Vomiting  . Sulfa Antibiotics Hives  . Sulfasalazine Other (See Comments)    Unknown reaction  . Vistaril [Hydroxyzine]     Unknown reaction  . Amoxicillin Rash    Per [place] [Acetaminophen] Rash    Thank you for allowing pharmacy to be a part of this patient's care.  Nicoletta Dress, PharmD Infectious Disease Pharmacist  Phone: [phone]

## 2020-09-05 NOTE — Anesthesia Postprocedure Evaluation (Signed)
Anesthesia Post Note  Patient: Thomas [name]  Procedure(s) Performed: Thoracic ten - Thoracic twelve Open Fusion (N/A Back)     Patient location during evaluation: PACU Anesthesia Type: General Level of consciousness: awake and alert Pain management: pain level controlled Vital Signs Assessment: post-procedure vital signs reviewed and stable Respiratory status: spontaneous breathing, nonlabored ventilation and respiratory function stable Cardiovascular status: blood pressure returned to baseline and stable Postop Assessment: no apparent nausea or vomiting Anesthetic complications: no   No complications documented.  Last Vitals:  Vitals:   09/05/20 0036 09/05/20 0351  BP: (!) 154/95 (!) 164/94  Pulse: 90 73  Resp: 16 15  Temp: 36.7 C 36.8 C  SpO2: 98% 98%                  Audry Pili

## 2020-09-05 NOTE — Progress Notes (Signed)
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 On 11/16, pt underwent surgical decompression around T11, fusion, and disc aspiration for microbiology  Clinical Impression  Patient seen for re-evaluation s/p surgical intervention noted above. Patient able to roll L and R with supervision, however refused EOB/OOB mobility this session due to increased pain. Provided education on spinal precautions and brace wear, however patient intermittently followed precautions when repositioning in bed with pulling overhead on bedrails. Patient presents with generalized weakness, impaired functional mobility, decreased activity tolerance. Patient will benefit from skilled PT services during acute stay to address listed deficits. Recommend SNF following discharge to maximize functional mobility.     Follow Up Recommendations SNF    Equipment Recommendations  None recommended by PT    Recommendations for Other Services       Precautions / Restrictions Precautions Precautions: Fall Precaution Comments: hx of R BKA Required Braces or Orthoses: Spinal Brace Spinal Brace: Thoracolumbosacral orthotic Restrictions Weight Bearing Restrictions: No      Mobility  Bed Mobility Overal bed mobility: Needs Assistance Bed Mobility: Rolling Rolling: Supervision         General bed mobility comments: Supervision for rolling side to side, cued for log rolling and back precautions. Pt noted to lift UE above head  to pull self up - once again reminded to avoid overhead pulling to promote optimal spine healing. Pt refused to attempt EOB or OOB attempts today due to pain    Transfers                 General transfer comment: pt declined due to pain  Ambulation/Gait                Stairs            Wheelchair Mobility    Modified Rankin (Stroke Patients Only)       Balance                                             Pertinent Vitals/Pain Pain Assessment: Faces Faces Pain Scale: Hurts even more Pain Location: Low Back  Pain Descriptors / Indicators: Aching;Sore Pain Intervention(s): Limited activity within patient's tolerance;Monitored during session;Premedicated before session;Repositioned    Home Living Family/patient expects to be discharged to:: Skilled nursing facility                 Additional Comments: Is at rehab at this time from [facility]    Prior Function Level of Independence: Needs assistance         Comments: Pt reports previously using cane and crutch, has prothetic LE. Today, pt reports he has not been able to stand since october 2021.     Hand Dominance   Dominant Hand: Right    Extremity/Trunk Assessment   Upper Extremity Assessment Upper Extremity Assessment: Defer to OT evaluation    Lower Extremity Assessment Lower Extremity Assessment: Generalized weakness    Cervical / Trunk Assessment Cervical / Trunk Assessment:  Normal  Communication   Communication: No difficulties  Cognition Arousal/Alertness: Awake/alert Behavior During Therapy: WFL for tasks assessed/performed Overall Cognitive Status: No family/caregiver present to determine baseline cognitive functioning Area of Impairment: Orientation;Memory;Problem solving;Safety/judgement                 Orientation Level: Disoriented to;Time   Memory: Decreased short-term memory;Decreased recall of precautions   Safety/Judgement:  Decreased awareness of safety;Decreased awareness of deficits   Problem Solving: Slow processing;Requires verbal cues General Comments: Pt with poor awareness of deficits or spinal precautions. Impulsive and talks over therapists      General Comments General comments (skin integrity, edema, etc.): Educated patient on spinal precautions with patient intermittently following precautions. Patient requested PT to assess B LE sensation. With eyes closed, patient noted unable to feel below mid lower leg and down on L side. Patient states "I can't feel my foot"    Exercises     Assessment/Plan    PT Assessment Patient needs continued PT services  PT Problem List Decreased strength;Decreased range of motion;Decreased activity tolerance;Decreased balance;Decreased mobility;Decreased safety awareness;Decreased knowledge of precautions;Pain       PT Treatment Interventions DME instruction;Gait training;Functional mobility training;Therapeutic activities;Therapeutic exercise;Balance training;Neuromuscular re-education;Patient/family education    PT Goals (Current goals can be found in the Care Plan section)  Acute Rehab PT Goals Patient Stated Goal: pain control, get stronger and get home PT Goal Formulation: With patient Time For Goal Achievement: 09/19/20 Potential to Achieve Goals: Good    Frequency Min 4X/week   Barriers to discharge        Co-evaluation PT/OT/SLP Co-Evaluation/Treatment: Yes Reason for Co-Treatment: Necessary to address cognition/behavior during functional activity;For patient/therapist safety;To address functional/ADL transfers PT goals addressed during session: Mobility/safety with mobility OT goals addressed during session: ADL's and self-care       AM-PAC PT "6 Clicks" Mobility  Outcome Measure Help needed turning from your back to your side while in a flat bed without using bedrails?: A Little Help needed moving from lying on your back to sitting on the  side of a flat bed without using bedrails?: A Lot Help needed moving to and from a bed to a chair (including a wheelchair)?: Total Help needed standing up from a chair using your arms (e.g., wheelchair or bedside chair)?: Total Help needed to walk in hospital room?: Total Help needed climbing 3-5 steps with a railing? : Total 6 Click Score: 9    End of Session   Activity Tolerance: Patient limited by pain Patient left: in bed;with call bell/phone within reach;with bed alarm set Nurse Communication: Mobility status PT Visit Diagnosis: Unsteadiness on feet (R26.81);Muscle weakness (generalized) (M62.81)    Time: 8341-9622 PT Time Calculation (min) (ACUTE ONLY): 22 min   Charges:   PT Evaluation $PT Re-evaluation: 1 Re-eval          Perrin Maltese, PT, DPT Acute Rehabilitation Services Pager [phone] Office [phone]   Thomas [name] 09/05/2020, 3:07 PM

## 2020-09-05 NOTE — Plan of Care (Signed)
See progress note.

## 2020-09-05 NOTE — Consult Note (Signed)
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) Active Problems:   [name], right ([place])   [name], type 2) ([place])   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[place])   CKD (chronic kidney disease), stage IV ([place])   Physical deconditioning   Antibiotics: Vancomycin Day 1   Assessment 1. Chronic discitis/Osteomyelitis T10-T11( active), Recurrent - Likely MRSA - s/p 2 prolonged IV treatments in the last 1 year  with suppressive PO doxycyline prior to admit - Improved inflammation and disc space fluid.in recent MRI T L spine but superimposed degenerative changes have progressed from February 2021,  - CRP 0.9,  - ESR 133  2. T10-T11 thoracic stenosis with cord compression/myelopathy  - s/p T10-12 open decompression and fusion on 09/04/2020  - Neuro SX on board    Recommendations  Ok to continue Vancomycin, pharmacy to dose for now. Would prefer to use Daptomycin if affordable given CKD. Baseline CPK Ceftriaxone 2 g iv daily CBC, CMP, vanc trough while on IV abx.  Will follow cultures - likely this is MRSA which was isolated before.  Will probably narrow down coverage depending upon cx growth   Rosiland Oz, MD [place] for Infectious Diseases   To contact the attending provider between 8A-5P or the covering provider during after hours 5P-8A, please log into the web site [email or web address] and access using universal [place] password for that web site. If you do not have the password, please call the hospital  operator. __________________________________________________________________________________________________________ HPI and Hospital Course: Mr. [name] with PMH of CKD , DM, Chronic back pain with MRSA thoracic discitis/ostemyelitis and recent COVID infection, Gout, HTN who presented to the ED with worsening back pain/left leg numbness and tingling. Patient was seen early November at [facility] center and while  transferring him to the MRI scanner, he fell on the metal table and experienced a pop and severe pain in his mid thoracic back and was discharged home without having an MRI. Patient later presented to the ED at [facility] with complain of worsening back pain/numbness/tingling on 11/10. MRI TL spine with findings as below. Patient underwent OR for T10-12 open decompression and fusion on 09/04/2020 with concerns of infection of disc and spine at T10-T12. Specimen has been sent for cultures and path.   Of note, Patient was admitted in Nov - dec 2020 with MRSA bacteremia and T11-12 discitis and osteomyelitis.   He received 18 days of daptomycin before being discharged on weekly doses of dalbavancin.  He had improvement in his pain but developed severe worsening of his pain shortly after his last dose of dalbavancin leading to readmission in Feb 2021.  MRI showed worsening of his thoracic inflammation.  He underwent repeat aspirate cultures were negative.  Blood cultures  were also negative.  Daptomycin was restarted for presumed persistent MRSA infection.On 2/5, lumbar spinal MRI showed progressive osteomyelitis and persistent paraspinal cellulitis and abscess at that time as T10-11 with epidural abscess. Underwent IR guided drainage. Cultures negative. Patient was switched to ceftaroline with plan  prolonged p.o. antibiotics afterwards. End date for ceftaroline was 4/5 ( approx 8 weeks+)  On follow up with NP Janene Madeira, he was thought to have good improvement in back pain and tenderness.  PICC line was d'ced and was started on Doxycyline BID. On follow up with Dr Tommy Medal, he was noted to have dramatic improvement of his back pain, Doxycycline was continued.   He was admitted to [facility] on October 1 for COVID-19 pneumonia with hypoxia. He presented to the [facility] emergency department for left sided weakness/confusion and TIA thought to be TIA.  .   ROS: General- Denies fever, chills, loss of appetite and loss of weight HEENT - Denies headache, blurry vision, neck pain, sinus pain Chest - Denies any chest pain, SOB or cough CVS- Denies any dizziness/lightheadedness, syncopal attacks, palpitations Abdomen- Denies any nausea, vomiting, abdominal pain, hematochezia and diarrhea Neuro - LEFT LEG NUMBNESS AND TINGLING Psych - Denies any changes in mood irritability or depressive symptoms GU- Denies any burning, dysuria, hematuria or increased frequency of urination Skin - denies any rashes/lesions MSK - denies any joint pain/swelling or restricted ROM   Past Medical History:  Diagnosis Date  . Arthritis   . BPH (benign prostatic hyperplasia) 11/24/2019  . Chronic pain syndrome 11/24/2019  . CKD (chronic kidney disease), stage IV ([place]) 11/24/2019  . Diabetes mellitus without complication ([place])   . Gout   . Hypertension   . Muscle pain   . Physical deconditioning 11/24/2019  . Scars   . Swelling    Past Surgical History:  Procedure Laterality Date  . BELOW KNEE LEG AMPUTATION Right   . ESOPHAGOGASTRODUODENOSCOPY (EGD) WITH PROPOFOL N/A 11/25/2019   Procedure: ESOPHAGOGASTRODUODENOSCOPY (EGD) WITH PROPOFOL;  Surgeon: Carol Ada, MD;  Location: [place];  Service: Endoscopy;  Laterality: N/A;  . IR FLUORO GUIDE CV LINE RIGHT  09/05/2019  . IR LUMBAR DISC ASPIRATION W/IMG GUIDE  11/26/2019  . IR US GUIDE VASC ACCESS RIGHT  09/05/2019  . RADIOLOGY WITH ANESTHESIA N/A 11/25/2019   Procedure: Thoracic and Lumbar MRI WITH ANESTHESIA;  Surgeon: Radiologist,  Medication, MD;  Location: [place];  Service: Radiology;  Laterality: N/A;    Scheduled Meds: . allopurinol  100 mg Oral Daily  . atorvastatin  20 mg Oral Daily  . benzonatate  100 mg Oral BID  . Chlorhexidine Gluconate Cloth  6 each Topical Daily  . finasteride  5 mg Oral Daily  . gabapentin  300 mg Oral BID  . heparin injection (subcutaneous)  5,000 Units Subcutaneous Q12H  . insulin aspart  0-15 Units Subcutaneous Q4H  . levothyroxine  50 mcg Oral QAC breakfast  . methocarbamol  500 mg Oral TID  . metoprolol tartrate  50 mg Oral BID  . mupirocin ointment  1 application Nasal BID  . polyethylene glycol  17 g Oral Daily  . senna-docusate  1 tablet Oral BID  . sodium chloride flush  3 mL Intravenous Q12H  . sodium zirconium cyclosilicate  10 g Oral Daily  . tamsulosin  0.4 mg Oral Daily  . Vitamin D (Ergocalciferol)  50,000 Units Oral Q Sat   Continuous Infusions: . sodium chloride    . sodium chloride 50  mL/hr at 09/04/20 2010   PRN Meds:.acetaminophen **OR** acetaminophen, alum & mag hydroxide-simeth, guaiFENesin-dextromethorphan, HYDROcodone-acetaminophen, HYDROmorphone (DILAUDID) injection, menthol-cetylpyridinium **OR** phenol, ondansetron **OR** ondansetron (ZOFRAN) IV, senna-docusate, sodium chloride flush  Allergies  Allergen Reactions  . Other Other (See Comments)    Blood pressure issues Per [facility] hospital: Pt states he can only take these pain meds or else he gets very sick: Oxycontin,morphine,demerol, and dilaudid are the only pain meds pt states he can take.    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 2.4.2021: He also reports that he currently take Oxycontin.   Marland Kitchen Oxymorphone Other (See Comments)    Causes kidney problems  . Aspirin Nausea And Vomiting    Per [facility]  . Beta Vulgaris Nausea And Vomiting    Beets  . Buspirone Other (See Comments)    unknown  . Cabbage Nausea And  Vomiting  . Codeine Other (See Comments)    unknown  . Dolobid [Diflunisal]     Unknown reaction  . Fish Allergy Nausea And Vomiting  . Fish-Derived Products Nausea And Vomiting  . Methadone     Unknown reaction  . Pentazocine Other (See Comments)    Unknown reaction  . Propoxyphene Other (See Comments)    Unknown reaction  . Shellfish Allergy Nausea And Vomiting  . Sulfa Antibiotics Hives  . Sulfasalazine Other (See Comments)    Unknown reaction  . Vistaril [Hydroxyzine]     Unknown reaction  . Amoxicillin Rash    Per [place] [Acetaminophen] Rash   Social History   Socioeconomic History  . Marital status: Single    Spouse name: Not on file  . Number of children: Not on file  . Years of education: Not on file  . Highest education level: Not on file  Occupational History  . Not on file  Tobacco Use  . Smoking status: Never Smoker  . Smokeless tobacco: Never Used  Vaping Use  . Vaping Use: Never used  Substance and Sexual Activity  . Alcohol use: No  . Drug use: No  . Sexual activity: Not on file  Other Topics Concern  . Not on file  Social History Narrative  . Not on file   Social Determinants of Health   Financial Resource Strain:   . Difficulty of Paying Living Expenses: Not on file  Food Insecurity:   . Worried About Charity fundraiser in the Last Year: Not on file  . Ran Out of Food in the Last Year: Not on file  Transportation Needs:   . Lack of Transportation (Medical): Not on file  . Lack of Transportation (Non-Medical): Not on file  Physical Activity:   . Days of Exercise per Week: Not on file  . Minutes of Exercise per Session: Not on file  Stress:   . Feeling of Stress : Not on file  Social Connections:   . Frequency of Communication with Friends and Family: Not on file  . Frequency of Social Gatherings with Friends and Family: Not on file  . Attends Religious Services: Not on file  . Active Member of Clubs or  Organizations: Not on file  . Attends Archivist Meetings: Not on file  . Marital Status: Not on file  Intimate Partner Violence:   . Fear of Current or Ex-Partner: Not on file  . Emotionally Abused: Not on file  . Physically Abused: Not on file  .  Sexually Abused: Not on file    Vitals BP (!) 164/90 (BP Location: Left Arm)   Pulse 77   Temp 97.7 F (36.5 C) (Oral)   Resp 16   Ht '6\' 7"'  (2.007 m)   Wt 96.2 kg   SpO2 98%   BMI 23.89 kg/m    Physical Exam Constitutional:  NAD    Comments:   Cardiovascular:     Rate and Rhythm: Normal rate and regular rhythm.     Heart sounds: Normal s1s2  Pulmonary:     Effort: Pulmonary effort is normal.     Comments: clear air nrty bilaterally   Abdominal:     Palpations: Abdomen is soft. BS+    Tenderness: Non tender   Musculoskeletal:        General: RT BKA site looks OK, Left Foot with gout changes   Skin:    Comments: no visible lesions or rashes   Neurological:     General: UNABLE TO EXAMINE BACK( PATIENT IS UNABLE TO REPOSITION BECAUSE OF BACK PAIN), ABLE TO LIFT RT BKA AND LEFT LEG WITH SOME PAIN   Psychiatric:        Mood and Affect: Mood normal.    LINES/TUBES: PIV  METAL IMPLANT/HARDWARE: Screws in the spine and interconnecting rods   Pertinent Microbiology Results for orders placed or performed during the hospital encounter of 08/29/20  Culture, blood (routine x 2)     Status: None   Collection Time: 08/29/20 10:56 PM   Specimen: BLOOD  Result Value Ref Range Status   Specimen Description   Final    BLOOD BLOOD RIGHT WRIST Performed at [facility], [place] [address], [place], [place] [address]    Special Requests   Final    BOTTLES DRAWN AEROBIC AND ANAEROBIC Blood Culture results may not be optimal due to an excessive volume of blood received in culture bottles Performed at [place] [address] [place], [place] [address]    Culture   Final    NO  GROWTH 5 DAYS Performed at [facility], [place] [address], [place], [place] [address]    Report Status 09/04/2020 FINAL  Final  Culture, blood (routine x 2)     Status: None   Collection Time: 08/29/20 10:56 PM   Specimen: BLOOD  Result Value Ref Range Status   Specimen Description   Final    BLOOD BLOOD RIGHT FOREARM Performed at [place] [address] [place], [place] [address]    Special Requests   Final    BOTTLES DRAWN AEROBIC AND ANAEROBIC Blood Culture adequate volume Performed at [place] [address] [place], [place] [address]    Culture   Final    NO GROWTH 5 DAYS Performed at [facility], [place] [address] [place], [place] [address]    Report Status 09/04/2020 FINAL  Final  Urine culture     Status: None   Collection Time: 08/30/20 12:59 AM   Specimen: Urine, Catheterized  Result Value Ref Range Status   Specimen Description   Final    URINE, CATHETERIZED Performed at [place] [address], [place], [place] [address]    Special Requests   Final    NONE Performed at [facility], [place] [address] [place], [place] [address]    Culture   Final    NO GROWTH Performed at [facility], [place]  [address] [place], [place] [address]    Report Status 08/31/2020 FINAL  Final  Respiratory Panel by RT PCR (Flu A&B, Covid) - Nasopharyngeal Swab     Status: Abnormal   Collection Time: 08/30/20  8:45 AM   Specimen: Nasopharyngeal Swab  Result Value Ref Range Status   SARS Coronavirus 2 by RT PCR POSITIVE (A) NEGATIVE Final    Comment: emailed L. Berdik RN 10:25 08/30/20 ([email or web address]) (NOTE) SARS-CoV-2 target nucleic acids are DETECTED.  SARS-CoV-2 RNA is generally detectable in upper respiratory specimens  during the acute phase of infection. Positive results are indicative of the presence of the identified virus, but do not rule out bacterial infection or  co-infection with other pathogens not detected by the test. Clinical correlation with patient history and other diagnostic information is necessary to determine patient infection status. The expected result is Negative.  Fact Sheet for Patients:  PinkCheek.be  Fact Sheet for Healthcare Providers: GravelBags.it  This test is not yet approved or cleared by the [place] FDA and  has been authorized for detection and/or diagnosis of SARS-CoV-2 by FDA under an Emergency Use Authorization (EUA).  This EUA will remain in effect (meaning this test can be used) for the duration of  the COVID-19  declaration under Section 564(b)(1) of the Act, 21 U.S.C. section 360bbb-3(b)(1), unless the authorization is terminated or revoked sooner.      Influenza A by PCR NEGATIVE NEGATIVE Final   Influenza B by PCR NEGATIVE NEGATIVE Final    Comment: (NOTE) The Xpert Xpress SARS-CoV-2/FLU/RSV assay is intended as an aid in  the diagnosis of influenza from Nasopharyngeal swab specimens and  should not be used as a sole basis for treatment. Nasal washings and  aspirates are unacceptable for Xpert Xpress SARS-CoV-2/FLU/RSV  testing.  Fact Sheet for Patients: PinkCheek.be  Fact Sheet for Healthcare Providers: GravelBags.it  This test is not yet approved or cleared by the [place] FDA and  has been authorized for detection and/or diagnosis of SARS-CoV-2 by  FDA under an Emergency Use Authorization (EUA). This EUA will remain  in effect (meaning this test can be used) for the duration of the  Covid-19 declaration under Section 564(b)(1) of the Act, 21  U.S.C. section 360bbb-3(b)(1), unless the authorization is  terminated or revoked. Performed at [facility], [place] [address] [place], [place] [address]   Surgical pcr screen     Status: Abnormal   Collection Time: 09/03/20  12:01 PM   Specimen: Nasal Mucosa; Nasal Swab  Result Value Ref Range Status   MRSA, PCR POSITIVE (A) NEGATIVE Final    Comment: RESULT CALLED TO, READ BACK BY AND VERIFIED WITH: R. Arcilla RN 13:50 09/03/20 ([email or web address])    Staphylococcus aureus POSITIVE (A) NEGATIVE Final    Comment: (NOTE) The Xpert SA Assay (FDA approved for NASAL specimens in patients 29 years of age and older), is one component of a comprehensive surveillance program. It is not intended to diagnose infection nor to guide or monitor treatment. Performed at [facility], [place] [address], [place],  [address]   Aerobic/Anaerobic Culture (surgical/deep wound)     Status: None (Preliminary result)   Collection Time: 09/04/20  4:39 PM   Specimen: Soft Tissue, Other  Result Value Ref Range Status   Specimen Description TISSUE  Final   Special Requests T10 THRU T11 DISC, PT ON VANC  Final   Gram Stain NO WBC SEEN NO ORGANISMS SEEN   Final   Culture  Final    NO GROWTH < 12 HOURS Performed at [place] [address], [place], [place] [address]    Report Status PENDING  Incomplete    Pertinent Lab seen by me: CBC Latest Ref Rng & Units 09/05/2020 09/04/2020 09/03/2020  WBC 4.0 - 10.5 K/uL 9.7 8.8 9.6  Hemoglobin 13.0 - 17.0 g/dL 9.5(L) 9.9(L) 9.8(L)  Hematocrit 39 - 52 % 29.9(L) 31.5(L) 30.8(L)  Platelets 150 - 400 K/uL 313 302 305   CMP Latest Ref Rng & Units 09/05/2020 09/05/2020 09/04/2020  Glucose 70 - 99 mg/dL 119(H) 122(H) 140(H)  BUN 8 - 23 mg/dL 32(H) 30(H) 30(H)  Creatinine 0.61 - 1.24 mg/dL 2.12(H) 2.14(H) 2.24(H)  Sodium 135 - 145 mmol/L 135 138 139  Potassium 3.5 - 5.1 mmol/L 4.5 6.0(H) 4.9  Chloride 98 - 111 mmol/L 105 108 105  CO2 22 - 32 mmol/L 20(L) 22 25  Calcium 8.9 - 10.3 mg/dL 9.0 9.0 8.9  Total Protein 6.5 - 8.1 g/dL - - -  Total Bilirubin 0.3 - 1.2 mg/dL - - -  Alkaline Phos 38 - 126 U/L - - -  AST 15 - 41 U/L - - -  ALT 0 - 44 U/L - - -    Pertinent  Imagings/Other Imagings Plain films and CT images have been personally visualized and interpreted; radiology reports have been reviewed. Decision making incorporated into the Impression / Recommendations.  08/30/2020  FINDINGS: MRI THORACIC SPINE FINDINGS  Alignment:  Mild retrolisthesis at T10-11  Vertebrae: Endplate edema at W65-68 with endplate erosions. Fluid in the disc space has partially collapsed since prior.  Cord: T10-11 spinal stenosis with cord compression and mild T2 hyperintensity.  Paraspinal and other soft tissues: Paravertebral edema at the level of discitis. Reticular pulmonary opacity that is extensive. Right hydronephrosis with cortical thinning.  Disc levels:  At T10-11 there is progressive disc space narrowing, ligamentum flavum thickening, and endplate spurring. An upward pointing disc protrusion is present. Progressive spinal stenosis with cord deformity. Advanced biforaminal impingement.  Ordinary degenerative changes at the other levels for age. No other levels of impingement seen.  Chronic T2 hyperintensity at the T11-12 disc space, without marrow edema or interval progression.  MRI LUMBAR SPINE FINDINGS  Segmentation:  5 lumbar type vertebrae  Alignment:  Mild retrolisthesis at L1-2  Vertebrae: Posterior-lateral arthrodesis from T12-S1. No evidence of acute fracture or discitis. Remote L1 compression fracture  Conus medullaris and cauda equina: Conus extends to the T12 level. Conus and cauda equina appear normal.  Paraspinal and other soft tissues: Marked atrophy of intrinsic back muscles. Right hydroureteronephrosis as reported on prior CT.  Disc levels:  No degenerative impingement.  IMPRESSION: MR THORACIC SPINE IMPRESSION  1. Chronic discitis/osteomyelitis at T10-11 with active but improved inflammation and disc space fluid. Superimposed degenerative changes have progressed from February 2021, with disc  protrusion and ligament thickening causing cord compression with cord edema. Question instability at this level. 2. No definite epidural collection.  The patient declined contrast.  MR LUMBAR SPINE IMPRESSION  T12-S1 solid arthrodesis with no impingement.  I have spent greater than 45 for this patient encounter including review of prior medical records with greater than 50% of time being face to face and coordination of their care.  FINDINGS: MRI THORACIC SPINE FINDINGS  Alignment:  Mild retrolisthesis at T10-11  Vertebrae: Endplate edema at L27-51 with endplate erosions. Fluid in the disc space has partially collapsed since prior.  Cord: T10-11 spinal stenosis with cord  compression and mild T2 hyperintensity.  Paraspinal and other soft tissues: Paravertebral edema at the level of discitis. Reticular pulmonary opacity that is extensive. Right hydronephrosis with cortical thinning.  Disc levels:  At T10-11 there is progressive disc space narrowing, ligamentum flavum thickening, and endplate spurring. An upward pointing disc protrusion is present. Progressive spinal stenosis with cord deformity. Advanced biforaminal impingement.  Ordinary degenerative changes at the other levels for age. No other levels of impingement seen.  Chronic T2 hyperintensity at the T11-12 disc space, without marrow edema or interval progression.  MRI LUMBAR SPINE FINDINGS  Segmentation:  5 lumbar type vertebrae  Alignment:  Mild retrolisthesis at L1-2  Vertebrae: Posterior-lateral arthrodesis from T12-S1. No evidence of acute fracture or discitis. Remote L1 compression fracture  Conus medullaris and cauda equina: Conus extends to the T12 level. Conus and cauda equina appear normal.  Paraspinal and other soft tissues: Marked atrophy of intrinsic back muscles. Right hydroureteronephrosis as reported on prior CT.  Disc levels:  No degenerative  impingement.  IMPRESSION: MR THORACIC SPINE IMPRESSION  1. Chronic discitis/osteomyelitis at T10-11 with active but improved inflammation and disc space fluid. Superimposed degenerative changes have progressed from February 2021, with disc protrusion and ligament thickening causing cord compression with cord edema. Question instability at this level. 2. No definite epidural collection.  The patient declined contrast.  MR LUMBAR SPINE IMPRESSION  T12-S1 solid arthrodesis with no impingement.

## 2020-09-05 NOTE — Progress Notes (Addendum)
Subjective:  Patient evaluated at the bedside this AM. He states no one informed him about " how surgery went yesterday". States pain is not under control and would like adjustments. Asking multiple times what wi-fi is. States previous hospital has called him multiplle times. He explains that they dropped him on the MRI table & sent him home. No appetite this morning. Appreciative of all the help receiving at hospital.   Objective:  Vital signs in last 24 hours: Vitals:   09/05/20 0036 09/05/20 0351 09/05/20 0500 09/05/20 0845  BP: (!) 154/95 (!) 164/94  (!) 164/90  Pulse: 90 73  77  Resp: 16 15  16   Temp: 98.1 F (36.7 C) 98.2 F (36.8 C)  97.7 F (36.5 C)  TempSrc: Oral Oral  Oral  SpO2: 98% 98%    Weight:   212 lb 1.3 oz (96.2 kg)   Height:       Physical Exam: General: Laying in bed, no acute distress Foot: L foot w/ dry, cracked skin. Multiple L toe deformities due to gout. No toenail present on great L toe. Toenails are thick, dry. Neuro: Awake, alert, oriented x3. Psych: Joyful mood, normal speech.  Assessment/Plan: Thomas [name] is 69yo male (he/him) with prior CVA, CKD IV, type 2 diabetes mellitus, gout, chronic back pain s/p trauma, multiple spinal surgeries, chronic osteomyelitis T10-T12 admitted 11/11 for acute on chronic back pain following recent injury at OSH, now POD1 from T10-T12 fusion and spinal cord decompression.  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 Patient continues to be in good spirits this morning although continues to have back pain. Will continue to monitor and  adjust pain regimen as needed. Hopeful that surgical intervention will give him some relief after suffering injury at OSH. Aspirate cultures collected in OR yesterday, NGTD. Discussed with Dr. Reatha Armour this AM, appears to still have osteomyelitis and discitis. Consulting infectious disease for further recommendations.  - Pain regimen: dilaudid 1mg  q3 PRN, norco 10 q4 PRN, home robaxin, gabapentin - Following aspirate cultures - F/u ID recs  #Constipation 2/2 chronic opioid use Last BM 11/15. Patient was kept NPO yesterday due to surgery. Will continue with bowel regimen and monitor. Expect patient to have BM after re-starting diet earlier today.  - Bowel regimen: Miralax qd, Sennakot TID  #Acute urinary retention #Bacteruria UCx NGTD. Continuing Foley for now. Will discuss renal u/s and voiding trial. - C/w Foley for bladder decompression - C/w home finasteride, tamsulosin  #Chronic kidney disease stage IV Cr 2.14 < 2.24 s/p IVF yesterday. Expect kidney function to stabilize as diet re-ordered this AM. - Trend RFP - Avoid nephrotoxic meds  #Type 2 diabetes mellitus CBG's slightly elevated, up to 190 early this AM. Will continue to monitor today and consider adding long-term insulin if sugars elevated after re-introducing diet today. - C/w SSI - CBG monitoring  #Hyperkalemia K+ 6.0 this AM, no hemolysis. Prior to today, potassium has been normal. Medications reviewed, no causative agent. Plan to give lokelma this morning and r/p BMP this afternoon. - Lokelma 10g - R/p BMP  #Multiple L toe deformities 2/2 chronic  gout #Chronic venous stasis On exam this morning, noted to have chronic skin changes consistent with venous stasis. In addition, multiple L toe deformities present with thick, dry toenails. Skin appears in tact, but patient at increased risk of pressure ulcers given his acute back pain. Will wash foot today and re-assess tomorrow AM. - L foot bath today  DIET: Regular IVF:  n/a DVT PPX: subQ heparin BOWEL: Senakot, Miralax CODE: FULL FAM COM: n/a  Prior to Admission Living Arrangement: Home Anticipated Discharge Location: Home v SNF pending PT/OT Barriers to Discharge: medical management Dispo: Anticipated discharge in approximately 2-4 day(s).   [name], MD 09/05/2020, 9:02 AM Pager: [phone] After 5pm on weekdays and 1pm on weekends: On Call pager [phone]

## 2020-09-05 NOTE — Progress Notes (Signed)
Foley catheter removed at 0703. Urinal given to patient and advised to urinate spontaneously in 6-8 hours.

## 2020-09-06 LAB — BASIC METABOLIC PANEL
Anion gap: 6 (ref 5–15)
BUN: 29 mg/dL — ABNORMAL HIGH (ref 8–23)
CO2: 25 mmol/L (ref 22–32)
Calcium: 8.9 mg/dL (ref 8.9–10.3)
Chloride: 107 mmol/L (ref 98–111)
Creatinine, Ser: 2.17 mg/dL — ABNORMAL HIGH (ref 0.61–1.24)
GFR, Estimated: 33 mL/min — ABNORMAL LOW (ref >60)
Glucose, Bld: 118 mg/dL — ABNORMAL HIGH (ref 70–99)
Potassium: 5.1 mmol/L (ref 3.5–5.1)
Sodium: 138 mmol/L (ref 135–145)

## 2020-09-06 LAB — SURGICAL PATHOLOGY

## 2020-09-06 LAB — CBC
HCT: 27.6 % — ABNORMAL LOW (ref 39.0–52.0)
Hemoglobin: 8.8 g/dL — ABNORMAL LOW (ref 13.0–17.0)
MCH: 31.4 pg (ref 26.0–34.0)
MCHC: 31.9 g/dL (ref 30.0–36.0)
MCV: 98.6 fL (ref 80.0–100.0)
Platelets: 251 10*3/uL (ref 150–400)
RBC: 2.8 MIL/uL — ABNORMAL LOW (ref 4.22–5.81)
RDW: 16.8 % — ABNORMAL HIGH (ref 11.5–15.5)
WBC: 10.1 10*3/uL (ref 4.0–10.5)
nRBC: 0 % (ref 0.0–0.2)

## 2020-09-06 LAB — GLUCOSE, CAPILLARY
Glucose-Capillary: 110 mg/dL — ABNORMAL HIGH (ref 70–99)
Glucose-Capillary: 119 mg/dL — ABNORMAL HIGH (ref 70–99)
Glucose-Capillary: 128 mg/dL — ABNORMAL HIGH (ref 70–99)
Glucose-Capillary: 180 mg/dL — ABNORMAL HIGH (ref 70–99)
Glucose-Capillary: 99 mg/dL (ref 70–99)

## 2020-09-06 LAB — CK: Total CK: 33 U/L — ABNORMAL LOW (ref 49–397)

## 2020-09-06 MED ORDER — AMLODIPINE BESYLATE 10 MG PO TABS
10.0000 mg | ORAL_TABLET | Freq: Every day | ORAL | Status: DC
  Administered 2020-09-06 – 2020-10-03 (×28): 10 mg via ORAL
  Filled 2020-09-06 (×28): qty 1

## 2020-09-06 MED ORDER — SENNOSIDES-DOCUSATE SODIUM 8.6-50 MG PO TABS
2.0000 | ORAL_TABLET | Freq: Two times a day (BID) | ORAL | Status: DC
  Administered 2020-09-06 – 2020-10-03 (×53): 2 via ORAL
  Filled 2020-09-06 (×55): qty 2

## 2020-09-06 MED ORDER — POLYETHYLENE GLYCOL 3350 17 G PO PACK
17.0000 g | PACK | Freq: Two times a day (BID) | ORAL | Status: DC
  Administered 2020-09-06 – 2020-09-27 (×23): 17 g via ORAL
  Filled 2020-09-06 (×47): qty 1

## 2020-09-06 MED ORDER — HYDROCODONE-ACETAMINOPHEN 10-325 MG PO TABS: 1.0000 | ORAL_TABLET | Freq: Four times a day (QID) | ORAL | Status: DC

## 2020-09-06 MED ORDER — ALUM & MAG HYDROXIDE-SIMETH 200-200-20 MG/5ML PO SUSP
30.0000 mL | Freq: Four times a day (QID) | ORAL | Status: DC
  Administered 2020-09-06: 30 mL via ORAL
  Filled 2020-09-06 (×2): qty 30

## 2020-09-06 MED ORDER — OXYCODONE HCL ER 20 MG PO T12A
20.0000 mg | EXTENDED_RELEASE_TABLET | Freq: Two times a day (BID) | ORAL | Status: DC
  Administered 2020-09-06 – 2020-09-09 (×6): 20 mg via ORAL
  Filled 2020-09-06 (×6): qty 1

## 2020-09-06 NOTE — TOC Progression Note (Signed)
Transition of Care [facility]) - Progression Note    Patient Details  Name: Thomas [name] MRN: [id number] Date of Birth: 1951/04/11  Transition of Care [facility]) CM/SW [place], RN Phone Number: 09/06/2020, 3:51 PM  Clinical Narrative:    Case management spoke with the patient to give him Medicare choice regarding SNF placement.  The patient was given the Medicare.gov list to choose a facility.  The patient is requesting [facility] in [place].  I called [facility] and asked to see if they could offer a bed to the patient.  Broadus Dangelo, CM at the facility is looking at the patient's clinicals and will return my call or accept the patient in the hub if they are able to offer a bed to the patient for admission.  Will continue to follow the patient for discharge to a facility.   Expected Discharge Plan: [place] Barriers to Discharge: Continued Medical Work up  Expected Discharge Plan and Services Expected Discharge Plan: [place]   Discharge Planning Services: CM Consult Post Acute Care Choice: [place] Living arrangements for the past 2 months: [place]                                       Social Determinants of Health (SDOH) Interventions    Readmission Risk Interventions Readmission Risk Prevention Plan 09/17/2019  Transportation Screening Complete  PCP or Specialist Appt within 5-7 Days Not Complete  Not Complete comments snf vs home  Home Care Screening Complete  Medication Review (RN CM) Complete  Some recent data might be hidden

## 2020-09-06 NOTE — Progress Notes (Signed)
Subjective:  Patient evaluated at bedside this AM. Patient initially very somnolent but arousable. Mentions he has good appetite, was able to eat dinner last night. Did not have BM overnight but mentions flatus. Denies abdominal pain. He endorses ongoing back pain. Able to move his right leg this morning. Explained about bone infection & need for IV antibiotics. Aware of PICC line. Shows good understanding about the plan and states " anything to make me feel better".   Objective:  Vital signs in last 24 hours: Vitals:   09/06/20 0026 09/06/20 0400 09/06/20 0500 09/06/20 0958  BP: (!) 155/80 (!) 158/78  131/85  Pulse: 80 78  81  Resp: 16 17  16   Temp: 98 F (36.7 C) 97.9 F (36.6 C)  99.5 F (37.5 C)  TempSrc: Oral Oral  Oral  SpO2: 99% 100%  96%  Weight:   212 lb 1.3 oz (96.2 kg)   Height:       Physical Exam: General: Chronically ill-appearing. No acute distress. Abdomen: Soft, distended, non-tender. Hyperactive bowel sounds. Foot: Unstagable pressure injury on 2nd L toe distally. Small pressure injury medial part of L 2nd toe. Skin dry, cracked. Loose skin on heal, although in tact. L great toenail absent. Other L toenails thick. Neuro: Awake, alert, oriented x3       Assessment/Plan: Mr. Breon is 69yo male (he/him) with prior CVA, CKD IV, type 2 diabetes mellitus, gout, chronic back pain s/p trauma, multiple spinal surgeries, chronic osteomyelitis T10-T12 admitted 11/11 for acute on chronic back pain following recent injury at OSH, now POD2 T10-T12 fusion and spinal cord decompression.  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 Patient doing well, continues to have back pain. Adjusting pain regimen, switching to home oxycodone with breakthrough coverage. Aspirate cultures NGTD. Will continue with daptomycin, ceftriaxone for osteomyelitis. ID to place PICC. Will discuss with ID regarding time length of antibiotics, likely 6-8 weeks. Appreciate ID recommendations. - Pain regimen: oxycodone 20mg  BID, dilaudid 1-2mg  PRN, home robaxin, gabapentin - F/u aspirate cultures - C/w daptomycin, ceftriaxone  #Constipation 2/2 chronic opioid use Last BM 11/15. Reports eating well over last 24 hours. Denies abdominal pain, states he experienced flatus overnight. Maalox switched to scheduled. If no BM with added agent, will give milk of magnesium, as enema would be difficult on patient given recent back surgery and back pain. - Bowel regimen: Miralax qd, Senna-S BID, Maalox q6  #Acute urinary retention #Chronic kidney disease stage IV UCx NGTD. Will watch renal function given increased pain medications.  - C/w Foley for bladder compression - C/w home finasteride, tamsulosin  #Type 2 diabetes mellitus CBG under control. Can introduce long-term insulin if sugars begin to rise. 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 Will order heel rise to prevent pressure ulcer. - Elevate L heel  DIET: Regular IVF: n/a DVT PPX: subQ heparin  BOWEL: Senakot, Miralax, Maalox CODE: FULL FAM COM: n/a  Prior to Admission Living Arrangement: Home Anticipated Discharge Location: Home v SNF pending PT/OT Barriers to  Discharge: medical management Dispo: Anticipated discharge in approximately 2-4 day(s).   [name], MD 09/06/2020, 10:37 AM Pager: [phone] After 5pm on weekdays and 1pm on weekends: On Call pager [phone]

## 2020-09-06 NOTE — Plan of Care (Signed)
Patient is s/p T10-T12 open decompression and fusion POD #2. Patient continues to c/o of back pain - being controlled with prn meds as ordered. Hemovac drain intact and draining WNL. [place]comb dsg with scant sanguineous drainage. Will continue to monitor and continue current POC.

## 2020-09-06 NOTE — Progress Notes (Signed)
Physical Therapy Treatment Patient Details Name: Thomas [name] MRN: [id number] DOB: [birth year] Today's Date: 09/06/2020    History of Present Illness 69 year old person living with a right below-knee amputation, chronic pain syndrome due to a complicated history of thoracic spine infections and degenerative disease, deconditioning after recent Covid infection and comorbidities admitted to our service for medical managements of another episode of chronic thoracic osteomyelitis and discitis with degenerative changes causing spinal cord compression. On 11/16, pt underwent surgical decompression around T11, fusion, and disc aspiration for microbiology    PT Comments    Pt supine on arrival, sleeping but easily awoken, drowsy but agreeable to therapy session. Pt remains limited due to poor pain tolerance despite premedication ~30 mins prior to session (RN notified). Pt performed rolling to L/R sides x6 reps ea side with Supervision, in order to don/doff TLSO brace and replace bed pads under pt. Pt given visual/verbal demo for log rolling technique to sit up EOB and attempted x2 reps, however once pt partially upright, he reports pain too high and returns to sidelying/supine. Pt self-directed and not allowing therapist/tech to assist with sidelying to edge of bed transition therefore limiting mobility further. Pt instructed on supine BLE therapeutic exercises to perform TID via demo/teachback and agreeable. Pt continues to benefit from PT services to progress toward functional mobility goals. D/C recs below remain appropriate.  Follow Up Recommendations  SNF     Equipment Recommendations  None recommended by PT    Recommendations for Other Services       Precautions / Restrictions Precautions Precautions: Fall Precaution Comments: hx of R BKA Required Braces or Orthoses: Spinal Brace Spinal Brace: Thoracolumbosacral orthotic Restrictions Weight Bearing Restrictions: No Other  Position/Activity Restrictions: back precautions    Mobility  Bed Mobility Overal bed mobility: Needs Assistance Bed Mobility: Rolling Rolling: Supervision   Supine to sit: Min assist;+2 for physical assistance;HOB elevated     General bed mobility comments: Supervision for rolling side to side x6 reps each side (to don/doff brace and to fix bed pads), cued for log rolling and back precautions. Pt noted to lift UE above head to pull self up - once again reminded to avoid overhead pulling to promote optimal spine healing.  Pt attempted log roll transition to EOB x2 (with TLSO brace donned) and once 50% upright, pt reports pain too severe and returns to supine, not allowing therapist to assist with full trunk rise (2 attempts)  Transfers                 General transfer comment: pt declined due to pain  Ambulation/Gait                 Stairs             Wheelchair Mobility    Modified Rankin (Stroke Patients Only)       Balance                                            Cognition Arousal/Alertness: Awake/alert Behavior During Therapy: WFL for tasks assessed/performed Overall Cognitive Status: No family/caregiver present to determine baseline cognitive functioning Area of Impairment: Memory;Problem solving;Safety/judgement;Following commands;Awareness                     Memory: Decreased short-term memory;Decreased recall of precautions Following Commands: Follows one step commands with increased time  Safety/Judgement: Decreased awareness of safety;Decreased awareness of deficits   Problem Solving: Slow processing;Difficulty sequencing;Requires verbal cues;Requires tactile cues General Comments: Pt with poor awareness of deficits or spinal precautions. Pt very self-directed however with poor pain tolerance and unable to reach full upright position, won't let PTA/tech staff physically assist with trunk rise and self-limiting       Exercises General Exercises - Lower Extremity Ankle Circles/Pumps: AROM;Both;10 reps;Supine Heel Slides: AROM;Both;10 reps;Supine Hip ABduction/ADduction: AROM;Both;10 reps;Supine    General Comments General comments (skin integrity, edema, etc.): pt instructed on back precautions, limited carryover from previous session and will need reinforcement      Pertinent Vitals/Pain Pain Assessment: 0-10 Pain Score: 3  Faces Pain Scale: Hurts whole lot Pain Location: 3/10 resting back pain (mid-back), increases to severe pain with log rolling attempts x2 Pain Descriptors / Indicators: Aching;Sore Pain Intervention(s): Monitored during session;Premedicated before session;Repositioned;Patient requesting pain meds-RN notified    Home Living                      Prior Function            PT Goals (current goals can now be found in the care plan section) Acute Rehab PT Goals Patient Stated Goal: pain control, get stronger and get home PT Goal Formulation: With patient Time For Goal Achievement: 09/19/20 Potential to Achieve Goals: Fair Progress towards PT goals: Progressing toward goals    Frequency    Min 4X/week      PT Plan Current plan remains appropriate    Co-evaluation              AM-PAC PT "6 Clicks" Mobility   Outcome Measure  Help needed turning from your back to your side while in a flat bed without using bedrails?: None Help needed moving from lying on your back to sitting on the side of a flat bed without using bedrails?: A Lot Help needed moving to and from a bed to a chair (including a wheelchair)?: Total Help needed standing up from a chair using your arms (e.g., wheelchair or bedside chair)?: Total Help needed to walk in hospital room?: Total Help needed climbing 3-5 steps with a railing? : Total 6 Click Score: 10    End of Session Equipment Utilized During Treatment: Back brace Activity Tolerance: Patient limited by pain Patient  left: in bed;with call bell/phone within reach;with bed alarm set Nurse Communication: Mobility status PT Visit Diagnosis: Unsteadiness on feet (R26.81);Muscle weakness (generalized) (M62.81)     Time: 1436-1500 PT Time Calculation (min) (ACUTE ONLY): 24 min  Charges:  $Therapeutic Activity: 23-37 mins                     Vence Lalor P., PTA Acute Rehabilitation Services Pager: [phone] Office: [place] 09/06/2020, 4:30 PM

## 2020-09-06 NOTE — TOC Initial Note (Signed)
Transition of Care [facility]) - Initial/Assessment Note    Patient Details  Name: Thomas [name] MRN: [id number] Date of Birth: 30-Nov-1950  Transition of Care [facility]) CM/SW Contact:    Curlene Labrum, RN Phone Number: 09/06/2020, 10:01 AM  Clinical Narrative:                 Case management met with the patient at the bedside regarding transitions of care to SNF.  The patient requests to be transferred to a new SNF other than [facility] SNF in [place] since patient received poor care according to the patient.  The patient is a S/P surgical decompression of T11 and history of Right BKA.  The patient will most likely have hemovac drain removed tomorrow by surgery according to the primary RN.  I plan to give Medicare choice to the patient on 09/06/2020 and patient will be able to transfer to the SNF once he is medically clear for discharge.  The patient has history of COVID infection and is not vaccinated and declined vaccination at this time.  Will continue to follow the patient for discharge to a SNF facility.  Expected Discharge Plan: Skilled Nursing Facility Barriers to Discharge: Continued Medical Work up   Patient Goals and CMS Choice Patient states their goals for this hospitalization and ongoing recovery are:: Patient plans to discharge to a skilled nursing facility.  Refuses placement back to [facility] CMS Medicare.gov Compare Post Acute Care list provided to:: Patient Choice offered to / list presented to : Patient  Expected Discharge Plan and Services Expected Discharge Plan: [place]   Discharge Planning Services: CM Consult Post Acute Care Choice: [place] Living arrangements for the past 2 months: [place]                                      Prior Living Arrangements/Services Living arrangements for the past 2 months: [place] Lives with:: Facility Resident Patient language and need  for interpreter reviewed:: Yes Do you feel safe going back to the place where you live?: Yes      Need for Family Participation in Patient Care: Yes (Comment) Care giver support system in place?: Yes (comment)   Criminal Activity/Legal Involvement Pertinent to Current Situation/Hospitalization: No - Comment as needed  Activities of Daily Living Home Assistive Devices/Equipment: Eyeglasses, Wheelchair ADL Screening (condition at time of admission) Patient's cognitive ability adequate to safely complete daily activities?: Yes Is the patient deaf or have difficulty hearing?: No Does the patient have difficulty seeing, even when wearing glasses/contacts?: No Does the patient have difficulty concentrating, remembering, or making decisions?: No Patient able to express need for assistance with ADLs?: Yes Does the patient have difficulty dressing or bathing?: No Independently performs ADLs?: Yes (appropriate for developmental age) Does the patient have difficulty walking or climbing stairs?: Yes Weakness of Legs: Both Weakness of Arms/Hands: None  Permission Sought/Granted Permission sought to share information with : Case Manager Permission granted to share information with : No, Yes, Verbal Permission Granted     Permission granted to share info w AGENCY: SNF facility        Emotional Assessment Appearance:: Appears stated age Attitude/Demeanor/Rapport: Engaged Affect (typically observed): Accepting Orientation: : Oriented to Self, Oriented to Place, Oriented to  Time, Oriented to Situation Alcohol / Substance Use: Not Applicable Psych Involvement: No (comment)  Admission diagnosis:  Urinary retention [  R33.9] Cord compression ([place]) [G95.20] Acute UTI [N39.0] Acute midline low back pain with sciatica, sciatica laterality unspecified [M54.40] 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 [facility]) [S24.103A] Patient Active Problem List   Diagnosis Date Noted  . 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  ([place]) 08/30/2020  . Urinary retention 12/04/2019  . BPH (benign prostatic hyperplasia) 11/24/2019  . CKD (chronic kidney disease), stage IV ([place]) 11/24/2019  . Chronic pain syndrome 11/24/2019  . Physical deconditioning 11/24/2019  . 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[place]) 08/31/2019  . Pulmonary emboli ([place]) 09/29/2018  . [name], right ([place]) 09/29/2018  . [name], type 2) ([place]) 09/29/2018  . Gout 09/29/2018  . HTN (hypertension) 09/29/2018  . CHF (congestive heart failure), NYHA class I, chronic, diastolic ([place]) 69/24/9324  . GERD (gastroesophageal reflux disease) 09/29/2018   PCP:  Antonietta Jewel, MD Pharmacy:   [place], [place] - [address] [place] [address] Phone: [phone] Fax: [phone]     Social Determinants of Health ([place]) Interventions    Readmission Risk Interventions Readmission Risk Prevention Plan 09/17/2019  Transportation Screening Complete  PCP or Specialist Appt within 5-7 Days Not Complete  Not Complete comments snf vs home  Home Care Screening Complete  Medication Review (RN CM) Complete  Some recent data might be hidden

## 2020-09-06 NOTE — Progress Notes (Signed)
Inpatient Rehab Admissions:  Inpatient Rehab Consult received.  Note pt declined mobility yesterday 2/2 pain and therapy recommending SNF.  Will follow for participation today and see if recommendations are updated.   Signed: Shann Medal, PT, DPT Admissions Coordinator [phone] 09/06/20  10:53 AM

## 2020-09-06 NOTE — Progress Notes (Signed)
Inpatient Rehab Admissions Coordinator:   Participation with therapy continues to be limited by pain and recommendations remain for SNF.  Will sign off at this time.   Shann Medal, PT, DPT Admissions Coordinator [phone] 09/06/20  4:43 PM

## 2020-09-06 NOTE — Progress Notes (Signed)
Neurosurgery Service Progress Note  Subjective: No acute events overnight, no new complaints today   Objective: Vitals:   09/05/20 2048 09/06/20 0026 09/06/20 0400 09/06/20 0500  BP: (!) 160/92 (!) 155/80 (!) 158/78   Pulse: 94 80 78   Resp: 18 16 17    Temp: 98.2 F (36.8 C) 98 F (36.7 C) 97.9 F (36.6 C)   TempSrc:  Oral Oral   SpO2: 97% 99% 100%   Weight:    96.2 kg  Height:        Physical Exam: R BKA, strength 4/5 in BLE, 5/5 in BUE, mild sensory changes in L foot but no clear sensory level  Assessment & Plan: 69 y.o. man s/p T10-T12 lami-fusion, recovering well.  -d/c drain -okay for DVT chemoPPx -activity as tolerated with TLSO when OOB   Thomas [name]  09/06/20 8:50 AM

## 2020-09-07 DIAGNOSIS — M4804 Spinal stenosis, thoracic region: Secondary | ICD-10-CM | POA: Diagnosis not present

## 2020-09-07 DIAGNOSIS — S24103A Unspecified injury at T7-T10 level of thoracic spinal cord, initial encounter: Secondary | ICD-10-CM | POA: Diagnosis not present

## 2020-09-07 DIAGNOSIS — M4624 Osteomyelitis of vertebra, thoracic region: Secondary | ICD-10-CM | POA: Diagnosis not present

## 2020-09-07 DIAGNOSIS — N184 Chronic kidney disease, stage 4 (severe): Secondary | ICD-10-CM | POA: Diagnosis not present

## 2020-09-07 LAB — GLUCOSE, CAPILLARY
Glucose-Capillary: 132 mg/dL — ABNORMAL HIGH (ref 70–99)
Glucose-Capillary: 237 mg/dL — ABNORMAL HIGH (ref 70–99)
Glucose-Capillary: 139 mg/dL — ABNORMAL HIGH (ref 70–99)

## 2020-09-07 MED ORDER — MAGNESIUM HYDROXIDE 400 MG/5ML PO SUSP
5.0000 mL | Freq: Once | ORAL | Status: DC
  Filled 2020-09-07: qty 30

## 2020-09-07 MED ORDER — METHOCARBAMOL 500 MG PO TABS
750.0000 mg | ORAL_TABLET | Freq: Three times a day (TID) | ORAL | Status: DC
  Administered 2020-09-07 – 2020-10-03 (×80): 750 mg via ORAL
  Filled 2020-09-07 (×83): qty 2

## 2020-09-07 MED ORDER — HYDROMORPHONE HCL 1 MG/ML IJ SOLN
2.0000 mg | INTRAMUSCULAR | Status: DC | PRN
Start: 2020-09-07 — End: 2020-09-07

## 2020-09-07 MED ORDER — OXYCODONE HCL 5 MG PO TABS
10.0000 mg | ORAL_TABLET | ORAL | Status: DC | PRN
  Administered 2020-09-07 – 2020-09-09 (×4): 10 mg via ORAL
  Filled 2020-09-07 (×4): qty 2

## 2020-09-07 MED ORDER — HYDROMORPHONE HCL 1 MG/ML IJ SOLN: 2.0000 mg | INTRAMUSCULAR | Status: DC | PRN

## 2020-09-07 MED ORDER — HEPARIN SODIUM (PORCINE) 5000 UNIT/ML IJ SOLN
5000.0000 [IU] | Freq: Three times a day (TID) | INTRAMUSCULAR | Status: DC
  Administered 2020-09-07 – 2020-09-10 (×9): 5000 [IU] via SUBCUTANEOUS
  Filled 2020-09-07 (×9): qty 1

## 2020-09-07 MED ORDER — HYDROMORPHONE HCL 1 MG/ML IJ SOLN
2.0000 mg | INTRAMUSCULAR | Status: DC | PRN
  Administered 2020-09-07 (×2): 3 mg via INTRAVENOUS
  Administered 2020-09-08 (×2): 2 mg via INTRAVENOUS
  Administered 2020-09-08: 3 mg via INTRAVENOUS
  Administered 2020-09-09 (×2): 2 mg via INTRAVENOUS
  Filled 2020-09-07: qty 3
  Filled 2020-09-07 (×2): qty 2
  Filled 2020-09-07 (×2): qty 3
  Filled 2020-09-07: qty 2
  Filled 2020-09-07 (×3): qty 3

## 2020-09-07 MED ORDER — MAGNESIUM HYDROXIDE 400 MG/5ML PO SUSP: 15.0000 mL | Freq: Every day | ORAL | Status: DC

## 2020-09-07 NOTE — Plan of Care (Signed)
  Problem: Pain Managment: Goal: General experience of comfort will improve Outcome: Progressing   

## 2020-09-07 NOTE — Progress Notes (Signed)
   Providing Compassionate, Quality Care - Together  NEUROSURGERY PROGRESS NOTE   S: No issues overnight. Complains of incisional back pain  O: EXAM:  BP (!) 166/99 (BP Location: Left Arm)   Pulse 89   Temp 98.4 F (36.9 C) (Oral)   Resp 16   Ht 6\' 7"  (2.007 m)   Wt 95.9 kg   SpO2 96%   BMI 23.82 kg/m   Awake, alert Bilateral upper extremities full strength Right lower extremity, BKA, strength 4/5 HF/KE/KF Left lower extremity 4/5 throughout Sensory intact light touch Incision clean dry intact Hemovac in place  ASSESSMENT: 69 y.o.malewith   1.T10/11 cord compression with cord signal change -Due to previous discitis/epidural abscess with erosion of T11 vertebral body and likely instability at this level  -Status post T10-12 open decompression and fusion on 09/04/2020  There was clear osteodiscitis at T10-11, Intra-Op pathology/microbiology was sent, no frank abscess noted  PLAN: -PT/OT -TLSO when out of bed -Pain control -Consult ID for long-term antibiotics, so far cx NGTD -DVT prophylaxis (subcu heparin) -dc hemovac   Thank you for allowing me to participate in this patient's care.  Please do not hesitate to call with questions or concerns.   Elwin Sleight, [facility] Cell: [phone]

## 2020-09-07 NOTE — [place] Notes (Signed)
[facility] Infectious Diseases Follow Up Note  Patient Identification: Patient Name: Thomas [name] MRN: [id number] [place] Date: 08/29/2020  7:42 PM Age: 69 y.o.Today's Date: 09/07/2020   Reason for Visit:  Principal Problem:   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 [facility]) Active Problems:   [name], right ([place])   [name], type 2) ([place])   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[place])   CKD (chronic kidney disease), stage IV (H[facility])   Physical deconditioning   Antibiotics:  Daptomycin Day 3, ceftriaxone Day 3 and Rifampin Day 2    Assessment 1. Chronic discitis/Osteomyelitis T10-T11( active), Recurrent - Likely MRSA - s/p 2 prolonged IV treatments in the last 1 year  with suppressive PO doxycyline prior to admit - Improved inflammation and disc space fluid.in recent MRI T L spine but superimposed degenerative changes have progressed from February 2021,  - CRP 0.9,  - ESR 133  2. T10-T11 thoracic stenosis with cord compression/myelopathy  - s/p T10-12 open decompression and fusion on 09/04/2020. 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 CMP and CK on IV abx Patient is planned to go to SNF for rehab. Will need a tunneled line for IV abx   Dr Linus Salmons ( pager [phone])  is available this weekend with questions.   Rest of the management as per the primary team. Thank you for the consult. Please page with pertinent questions or concerns.  Rosiland Oz, MD Infectious [place] for Infectious Diseases   To contact the attending provider between 8A-5P or the covering provider during after hours 5P-8A, please  log into the web site [email or web address] and access using universal [place] password for that web site. If you do not have the password, please call the hospital operator. ______________________________________________________________________ Subjective patient seen and examined at the bedside. Complains of back pain and asking for pain meds, Denies any fever, chills or issues with the IV abx.    Vitals BP (!) 166/99 (BP Location: Left Arm)   Pulse 89   Temp 98.4 F (36.9 C) (Oral)   Resp 16   Ht '6\' 7"'  (2.007 m)   Wt 95.9 kg   SpO2 96%   BMI 23.82 kg/m    Physical Exam Constitutional: NAD Comments:   Cardiovascular:  Rate and Rhythm: Normal rateand regular rhythm.  Heart sounds: Normal s1s2  Pulmonary:  Effort: Pulmonary effort is normal.  Comments: clear air nrty bilaterally   Abdominal:  Palpations: Abdomen is soft. BS+ Tenderness: Non tender   Musculoskeletal:  General: RT BKA site looks OK, Left Foot with gout changes   Skin: Comments: no visible lesions or rashes   Neurological:  General: UNABLE TO EXAMINE BACK( PATIENT IS UNABLE TO REPOSITION BECAUSE OF BACK PAIN) Psychiatric:  Mood and Affect: Moodnormal.    LINES/TUBES: PIV  METAL IMPLANT/HARDWARE: Screws in the spine and interconnecting rods   Pertinent Microbiology Results for orders placed or performed during the hospital encounter of 08/29/20  Culture, blood (routine x 2)     Status: None   Collection Time: 08/29/20 10:56 PM   Specimen: BLOOD  Result Value Ref Range Status   Specimen Description  Final    BLOOD BLOOD RIGHT WRIST Performed at [facility] [address] [place], [place] [address]    Special Requests   Final    BOTTLES DRAWN AEROBIC AND ANAEROBIC Blood Culture results may not be optimal due to an excessive volume of blood received in culture bottles Performed at [facility] [address], [place], [place] [address]    Culture   Final    NO GROWTH 5 DAYS Performed at [facility], [place] [address] [place], [place] [address]    Report Status 09/04/2020 FINAL  Final  Culture, blood (routine x 2)     Status: None   Collection Time: 08/29/20 10:56 PM   Specimen: BLOOD  Result Value Ref Range Status   Specimen Description   Final    BLOOD BLOOD RIGHT FOREARM Performed at [facility] [address] [place], [place] [address]    Special Requests   Final    BOTTLES DRAWN AEROBIC AND ANAEROBIC Blood Culture adequate volume Performed at [facility] [address] [place], [place] [address]    Culture   Final    NO GROWTH 5 DAYS Performed at [facility], [place] [address], [place], [place] [address]    Report Status 09/04/2020 FINAL  Final  Urine culture     Status: None   Collection Time: 08/30/20 12:59 AM   Specimen: Urine, Catheterized  Result Value Ref Range Status   Specimen Description   Final    URINE, CATHETERIZED Performed at [facility] [address], [place], [place] [address]    Special Requests   Final    NONE Performed at [facility], [name] [address] [place], [place] [address]    Culture   Final    NO GROWTH Performed at [facility], [place] [address] [place], [place] [address]    Report Status 08/31/2020 FINAL  Final  Respiratory Panel by RT PCR (Flu A&B, Covid) - Nasopharyngeal Swab     Status: Abnormal   Collection Time: 08/30/20  8:45 AM   Specimen: Nasopharyngeal Swab  Result Value Ref Range Status   SARS Coronavirus 2 by RT PCR POSITIVE (A) NEGATIVE Final    Comment: emailed L. Berdik RN 10:25 08/30/20 ([email or web address]) (NOTE) SARS-CoV-2 target nucleic acids are DETECTED.  SARS-CoV-2 RNA is generally detectable in upper respiratory specimens  during the acute phase of infection. Positive results are indicative of the presence of the identified virus,  but do not rule out bacterial infection or co-infection with other pathogens not detected by the test. Clinical correlation with patient history and other diagnostic information is necessary to determine patient infection status. The expected result is Negative.  Fact Sheet for Patients:  PinkCheek.be  Fact Sheet for Healthcare Providers: GravelBags.it  This test is not yet approved or cleared by the [place] FDA and  has been authorized for detection and/or diagnosis of SARS-CoV-2 by FDA under an Emergency Use Authorization (EUA).  This EUA will remain in effect (meaning this test can be used) for the duration of  the COVID-19  declaration under Section 564(b)(1) of the Act, 21 U.S.C. section 360bbb-3(b)(1), unless the authorization is terminated or revoked sooner.      Influenza A by PCR NEGATIVE NEGATIVE Final   Influenza B by PCR NEGATIVE NEGATIVE Final    Comment: (NOTE) The Xpert Xpress SARS-CoV-2/FLU/RSV assay is intended as an aid in  the diagnosis of influenza from Nasopharyngeal swab specimens and  should not be used as a sole basis for treatment. Nasal washings and  aspirates are unacceptable for Xpert Xpress SARS-CoV-2/FLU/RSV  testing.  Fact Sheet for Patients: PinkCheek.be  Fact Sheet for Healthcare Providers: GravelBags.it  This test is not yet approved or cleared by the [place] FDA and  has been authorized for detection and/or diagnosis of SARS-CoV-2 by  FDA under an Emergency Use Authorization (EUA). This EUA will remain  in effect (meaning this test can be used) for the duration of the  Covid-19 declaration under Section 564(b)(1) of the Act, 21  U.S.C. section 360bbb-3(b)(1), unless the authorization is  terminated or revoked. Performed at [facility], [place] [address], [place], [place] [address]   Surgical pcr screen      Status: Abnormal   Collection Time: 09/03/20 12:01 PM   Specimen: Nasal Mucosa; Nasal Swab  Result Value Ref Range Status   MRSA, PCR POSITIVE (A) NEGATIVE Final    Comment: RESULT CALLED TO, READ BACK BY AND VERIFIED WITH: R. Arcilla RN 13:50 09/03/20 (wilsonm)    Staphylococcus aureus POSITIVE (A) NEGATIVE Final    Comment: (NOTE) The Xpert SA Assay (FDA approved for NASAL specimens in patients 64 years of age and older), is one component of a comprehensive surveillance program. It is not intended to diagnose infection nor to guide or monitor treatment. Performed at [facility], [place] [address] [place], [place] [address]   Fungus Culture With Stain     Status: None (Preliminary result)   Collection Time: 09/04/20  4:39 PM   Specimen: Soft Tissue, Other  Result Value Ref Range Status   Fungus Stain Final report  Final    Comment: (NOTE) Performed At: [facility] [place], [place] [id number] Rush Farmer MD WG:[id number]    Fungus (Mycology) Culture PENDING  Incomplete   Fungal Source TISSUE  Final    Comment: T10 THRU 11 Performed at  [facility], [place] [address] [place], [place] [address]   Aerobic/Anaerobic Culture (surgical/deep wound)     Status: None (Preliminary result)   Collection Time: 09/04/20  4:39 PM   Specimen: Soft Tissue, Other  Result Value Ref Range Status   Specimen Description TISSUE  Final   Special Requests T10 THRU T11 DISC, PT ON VANC  Final   Gram Stain NO WBC SEEN NO ORGANISMS SEEN   Final   Culture   Final    NO GROWTH 3 DAYS NO ANAEROBES ISOLATED; CULTURE IN PROGRESS FOR 5 DAYS Performed at [facility], [place] [address], [place],  [address]    Report Status PENDING  Incomplete  Fungus Culture Result     Status: None   Collection Time: 09/04/20  4:39 PM  Result Value Ref Range Status   Result 1 Comment  Final    Comment: (NOTE) KOH/Calcofluor preparation:  no fungus observed. Performed At:  [facility], [place] [id number] Rush Farmer MD UU:[id number]      Pertinent Lab. CBC Latest Ref Rng & Units 09/06/2020 09/05/2020 09/04/2020  WBC 4.0 - 10.5 K/uL 10.1 9.7 8.8  Hemoglobin 13.0 - 17.0 g/dL 8.8(L) 9.5(L) 9.9(L)  Hematocrit 39 - 52 % 27.6(L) 29.9(L) 31.5(L)  Platelets 150 - 400 K/uL 251 313 302   CMP Latest Ref Rng & Units 09/06/2020 09/05/2020 09/05/2020  Glucose 70 - 99 mg/dL 118(H) 119(H) 122(H)  BUN 8 - 23  mg/dL 29(H) 32(H) 30(H)  Creatinine 0.61 - 1.24 mg/dL 2.17(H) 2.12(H) 2.14(H)  Sodium 135 - 145 mmol/L 138 135 138  Potassium 3.5 - 5.1 mmol/L 5.1 4.5 6.0(H)  Chloride 98 - 111 mmol/L 107 105 108  CO2 22 - 32 mmol/L 25 20(L) 22  Calcium 8.9 - 10.3 mg/dL 8.9 9.0 9.0  Total Protein 6.5 - 8.1 g/dL - - -  Total Bilirubin 0.3 - 1.2 mg/dL - - -  Alkaline Phos 38 - 126 U/L - - -  AST 15 - 41 U/L - - -  ALT 0 - 44 U/L - - -    Pertinent Imaging today Plain films and CT images have been personally visualized and interpreted; radiology reports have been reviewed. Decision making incorporated into the Impression / Recommendations.

## 2020-09-07 NOTE — [place] Notes (Signed)
PHARMACY CONSULT NOTE FOR:  OUTPATIENT  PARENTERAL ANTIBIOTIC THERAPY (OPAT)  Indication: osteomyelitis/discitis Regimen: ceftriaxone 2g IV q24h AND daptomycin 1000mg  IV q24h End date: 10/31/2019  IV antibiotic discharge orders are pended. To discharging provider:  please sign these orders via discharge navigator,  Select New Orders & click on the button choice - Manage This Unsigned Work.     Thank you for allowing pharmacy to be a part of this patient's care.  Wilson Singer, PharmD PGY1 Pharmacy Resident 09/07/2020 11:58 AM

## 2020-09-07 NOTE — Plan of Care (Signed)
No acute changes since the previous night that I took care of him. Pain still being managed with prn and new scheduled meds as ordered. Encouraging patient to start moving more with PT and incentive spirometry use. Will continue to monitor and continue current POC.

## 2020-09-07 NOTE — TOC [place] Note (Signed)
Transition of Care [facility]) - [place] Note    Patient Details  Name: ABDOUL ENCINAS MRN: [id number] Date of Birth: 04-Feb-1951  Transition of Care [facility]) CM/SW [place], RN Phone Number: 09/07/2020, 10:33 AM  Clinical Narrative:    Case management called [facility] in [place], [place] to follow up with the facility regarding patient's request for the facility.  I left a voicemail with Ivin Booty, [place] at [facility] requesting bed offer and will follow up about possible acceptance of the patient for admission.  Will continue to follow for SNF placement.   Expected Discharge Plan: [place] Barriers to Discharge: Continued Medical Work up  Expected Discharge Plan and Services Expected Discharge Plan: [place]   Discharge Planning Services: CM Consult Post Acute Care Choice: [place] Living arrangements for the past 2 months: [place]                                       Social Determinants of Health (SDOH) Interventions    Readmission Risk Interventions Readmission Risk Prevention Plan 09/17/2019  Transportation Screening Complete  PCP or Specialist Appt within 5-7 Days Not Complete  Not Complete comments snf vs home  Home Care Screening Complete  Medication Review (RN CM) Complete  Some recent data might be hidden

## 2020-09-07 NOTE — [place] Notes (Signed)
OT Cancellation Note  Patient Details Name: Thomas [name] MRN: [id number] DOB: 08/09/1951   Cancelled Treatment:    Reason Eval/Treat Not Completed: Fatigue/lethargy limiting ability to participate;Patient declined, no reason specified. Pt given pain meds prior to therapy session. On entry, pt lethargic but able to arouse to answer questions. Pt reporting too much pain to participate in any EOB or OOB activities. Pt declined therapy today. Will follow-up for OT session as time allows.   Layla Maw 09/07/2020, 1:06 PM

## 2020-09-07 NOTE — [place] Notes (Signed)
Nurse from [facility] removed pt's hemovac drain and changed honeycomb dressing.

## 2020-09-07 NOTE — [place] Notes (Signed)
   Subjective:  Patient is endorsing continued back pain this morning; he states that this may have gotten worse at the location of the surgery. He is not sleeping much. Denies any weakness in bilateral lower extremities or paresthesias. He notes that it hurts his back when he moves his legs. He has been tolerating diet. He denies any abdominal pain at this time. He endorses passing flatus this morning. He denies any nausea/vomiting but is coughing up phlegm. He has not had a bowel movement yet. He notes that he has been struggling with constipation since he hurt his back. He required enema when at [facility] and also needed lactulose twice daily which he noted helped with his bowel movement.   Objective:  Vital signs in last 24 hours: Vitals:   09/06/20 2004 09/07/20 0038 09/07/20 0452 09/07/20 0500  BP: (!) 164/96 (!) 155/67 (!) 153/91   Pulse: 84 82 84   Resp: 18 17 17    Temp: 98.3 F (36.8 C) 98.5 F (36.9 C) 98.6 F (37 C)   TempSrc:  Oral Oral   SpO2: 100% 100% 96%   Weight:    95.9 kg  Height:       Physical Exam: General: Chronically ill-appearing. No acute distress Neuro: Awake, alert, oriented x3. Psych: Joyful mood. Normal speech.  Assessment/Plan: Mr. Fitz is 69yo male (he/him) with prior CVA, CKD IV, type 2 diabetes mellitus, gout, chronic back pain s/p trauma, multiple spinal surgeries, chronic osteomyelitis T10-T12 admitted 11/11 for acute on chronic back pain following recent injury at OSH, now POD3 T10-T12 fusion, spinal cord decompression, experiencing constipation due to continued opioid use.   Principal Problem:   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 [facility]) Active Problems:   [name], right ([place])   [name], type 2) ([place])   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HCC)   CKD (chronic kidney disease), stage IV (HCC)   Physical deconditioning  #Acute on chronic back pain 2/2 injury #Chronic discitis/osteomyelitis T10-T12 #Hx MRSA  epidural abscess POD3 spinal cord decompression, T10-T12 fusion. Continues to have back pain, reports worsening since yesterday after pain regimen change. Will up-titrate for better control. Aspirate cx NGTD. C/w daptomycin, ceftriaxone for osteomyelitis. - Patient regimen: oxycodone 20mg  BID, dilaudid 2-3mg  PRN, home robaxin, gabapentin - F/u aspirate cultures - C/w daptomycin, ceftriaxone - Will plan for PICC v tunneled catheter Monday  #Constipation 2/2 chronic opioid use Last BM 11/15, continues to eat well. No abdominal pain, continues to have flatus. Will attempt bowel disimpaction first. If negative, will give milk of magnesium. - Disimpaction - Bowel regimen: Miralax qd, Senna-S BID  #Acute urinary retention #Chronic kidney disease stage IV UCx NGTD. Lab holiday, will trend RFP as clinically indicated. - C/w Foley for bladder decompression - C/w home finasteride, tamsulosin  #Type 2 diabetes mellitus CBG's under control. Can add long-acting insulin if increase. 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 Will order heel rise to prevent pressure ulcer. - Elevate L heel  DIET: Regular  IVF: n/a DVT PPX: subQ heparin BOWEL: [place], Miralax CODE: FULL FAM COM: n/a  Prior to Admission Living Arrangement: Home Anticipated Discharge Location: HOME v SNF pending PT/OT Barriers to Discharge: medical management Dispo: Anticipated discharge in approximately 4-5 day(s).   [name], MD 09/07/2020, 7:01 AM Pager: [phone] After 5pm on weekdays and 1pm on weekends: On Call pager [phone]

## 2020-09-07 NOTE — [place] Notes (Signed)
PT Cancellation Note  Patient Details Name: Thomas [name] MRN: [id number] DOB: 1951/09/11   Cancelled Treatment:    Reason Eval/Treat Not Completed: (P) Pain limiting ability to participate (Pt lethargic/drowsy upon staff entrance to room, recently medicated ~30 mins prior to attempt, however pt reporting pain in mid-back too severe to attempt mobility or rolling. Pt able to perform heel slides with LLE and encouraged to continue BLE exercises TID.)   Kara Pacer [name] 09/07/2020, 1:53 PM

## 2020-09-08 LAB — GLUCOSE, CAPILLARY
Glucose-Capillary: 193 mg/dL — ABNORMAL HIGH (ref 70–99)
Glucose-Capillary: 111 mg/dL — ABNORMAL HIGH (ref 70–99)
Glucose-Capillary: 115 mg/dL — ABNORMAL HIGH (ref 70–99)
Glucose-Capillary: 129 mg/dL — ABNORMAL HIGH (ref 70–99)
Glucose-Capillary: 131 mg/dL — ABNORMAL HIGH (ref 70–99)

## 2020-09-08 NOTE — [place] Notes (Addendum)
Pharmacy Antibiotic Note  Thomas [name] is a 69 y.o. male admitted on 08/29/2020 s/p spinal cord decompression.  Pharmacy has been consulted for daptomycin dosing for discitis/epidural abscess s/p debridement.  Baseline CK wnl at 33. Patient remains afebrile, renal function stable. Planning PICC line vs tunneled catheter placement on 11/22. OPAT entered with stop date 10/30/2020.  Plan: -Daptomycin 1000mg  IV q24h -Ceftriaxone 2g IV q24h -Rifampin 300mg  PO q12h  -Will follow renal function, weekly CK, cultures and clinical progress   Height: 6\' 7"  (200.7 cm) Weight: 94.9 kg (209 lb 3.5 oz) IBW/kg (Calculated) : 93.7  Temp (24hrs), Avg:98.2 F (36.8 C), Min:97.7 F (36.5 C), Max:98.8 F (37.1 C)  Recent Labs  Lab 09/02/20 0817 09/02/20 0817 09/03/20 0238 09/04/20 0433 09/05/20 0232 09/05/20 1254 09/06/20 0426  WBC 7.9  --  9.6 8.8 9.7  --  10.1  CREATININE 2.08*   < > 2.44* 2.24* 2.14* 2.12* 2.17*   < > = values in this interval not displayed.    Estimated Creatinine Clearance: 43.8 mL/min (A) (by C-G formula based on SCr of 2.17 mg/dL (H)).    Allergies  Allergen Reactions  . Other Other (See Comments)    Blood pressure issues Per [facility] hospital: Pt states he can only take these pain meds or else he gets very sick: Oxycontin,morphine,demerol, and dilaudid are the only pain meds pt states he can take.    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 2.4.2021: He also reports that he currently take Oxycontin.   Marland Kitchen Oxymorphone Other (See Comments)    Causes kidney problems  . Aspirin Nausea And Vomiting    Per [facility]  . Beta Vulgaris Nausea And Vomiting    Beets  . Buspirone Other (See Comments)    unknown  . Cabbage Nausea And Vomiting  . Codeine Other (See Comments)    unknown  . Dolobid [Diflunisal]     Unknown reaction  . Fish Allergy Nausea And Vomiting  . Fish-Derived Products  Nausea And Vomiting  . Methadone     Unknown reaction  . Pentazocine Other (See Comments)    Unknown reaction  . Propoxyphene Other (See Comments)    Unknown reaction  . Shellfish Allergy Nausea And Vomiting  . Sulfa Antibiotics Hives  . Sulfasalazine Other (See Comments)    Unknown reaction  . Vistaril [Hydroxyzine]     Unknown reaction  . Amoxicillin Rash    Per [place] [Acetaminophen] Rash    Thank you for allowing pharmacy to be a part of this patient's care.  Berenice Bouton, PharmD PGY2 Pharmacy Resident Phone between 7 am - 3:30 pm: [phone]  Please check AMION for all [place] phone numbers After 10:00 PM, call [place] [phone]

## 2020-09-08 NOTE — Progress Notes (Signed)
Subjective: HD 9 for Thomas [name] who is a 69 year old male with prior CVA, CKD IV, type II DM, chronic back pain s/p trauma and multiple spinal surgeries, chronic osteomyelitis at T10-T12 admitted for acute on chronic back pain following recent injury at OSH. Currently POD4 of T10-T12 fusion and spinal cord decompression.   Overnight, no acute events reported.  Thomas [name] was evaluated at bedside this morning. States still in pain, but it's improving. Was not able to work with PT, tried but was not not able to work with them. He still has not been able to have a bowel movement but is passing gas. He does not want milk of magnesia as it made him sick last time. Discussion about enema today and agrees with the plan.    Objective:  Vital signs in last 24 hours: Vitals:   09/07/20 1543 09/07/20 1955 09/08/20 0500 09/08/20 0550  BP: (!) 143/94 (!) 157/91  (!) 146/97  Pulse: (!) 102 97  98  Resp: 18 16  18   Temp: 98.8 F (37.1 C) 97.7 F (36.5 C)  97.8 F (36.6 C)  TempSrc: Oral Axillary  Axillary  SpO2: 97% 96%  98%  Weight:   94.9 kg   Height:       CBC Latest Ref Rng & Units 09/06/2020 09/05/2020 09/04/2020  WBC 4.0 - 10.5 K/uL 10.1 9.7 8.8  Hemoglobin 13.0 - 17.0 g/dL 8.8(L) 9.5(L) 9.9(L)  Hematocrit 39 - 52 % 27.6(L) 29.9(L) 31.5(L)  Platelets 150 - 400 K/uL 251 313 302   BMP Latest Ref Rng & Units 09/06/2020 09/05/2020 09/05/2020  Glucose 70 - 99 mg/dL 118(H) 119(H) 122(H)  BUN 8 - 23 mg/dL 29(H) 32(H) 30(H)  Creatinine 0.61 - 1.24 mg/dL 2.17(H) 2.12(H) 2.14(H)  BUN/Creat Ratio 6 - 22 (calc) - - -  Sodium 135 - 145 mmol/L 138 135 138  Potassium 3.5 - 5.1 mmol/L 5.1 4.5 6.0(H)  Chloride 98 - 111 mmol/L 107 105 108  CO2 22 - 32 mmol/L 25 20(L) 22  Calcium 8.9 - 10.3 mg/dL 8.9 9.0 9.0   Physical Exam  Constitutional: Chronically ill appearing male; No distress.  GI: Nondistended, soft, mild tenderness to palpation in epigastric region; bowel sounds present   Musculoskeletal: Normal bulk and tone.  No peripheral edema noted. Neurological: Is alert and oriented x4, spontaneously moving all extremities   Assessment/Plan:  Principal Problem:   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 [facility]) Active Problems:   [name], right ([place])   [name], type 2) ([place])   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[place])   CKD (chronic kidney disease), stage IV ([place])   Physical deconditioning Thomas. Thomas [name] is 69yo male (he/him) with prior CVA, CKD IV, type 2 diabetes mellitus, gout, chronic back pain s/p trauma, multiple spinal surgeries, R BKA, chronic osteomyelitis T10-T12 admitted 11/11 for acute on chronic back pain following recent injury at OSH, now POD3 T10-T12 fusion, spinal cord decompression, experiencing constipation due to continued opioid use.   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 He notes that he has not been able to work with PT/OT yet and would like to schedule his pain medication prior to working wit them so that it is more tolerable.  Noted to have osteodiscitis without frank abscess intraoperatively. Aspirate cultures have  been negative to date.Patient is on daptomycin and ceftriaxone. - Neurosurgery following, appreciate recommendations - ID consulted, appreciate recommendations  - Continue daptomycin and ceftriaxone - Rec to start rifampin given prior hx of MRSA with new hardware - Rec for 8 wks of antibiotics for which he will need PICC line vs tunneled cath; will plan for this on Monday - F/u intraop cultures - Continue oxycodone 20mg  bid, dilaudid 2-3mg  prn, robaxin and gabapentin  Constipation 2/2 chronic opioid use Continues to have flatus and abdominal exam is benign. Attempted disimpaction negative. Patient does not want milk of  magnesia at this time as he states that it made him feel unwell previously. Agreeable to enema. - Tap water enema - Continue miralax and senna-S  Acute urinary retention  CKD IV Patient with foley in place for bladder decompression. Renal function has been at baseline  - Continue foley for bladder decompression - Continue finasteride and tamsulosin - Intermittent labs to monitor renal function  Diabetes mellitus: Blood glucose has been relatively well controlled but does have some spikes in CBG's at mealtime. Currently on SSI  - Novolog 3U tid w/meals + SSI - Continue CBG monitoring   Diet: Regular  Fluids: None  DVT Prophylaxis: subQ heparin Code status: FULL   Prior to Admission Living Arrangement: Home  Anticipated Discharge Location: SNF Barriers to Discharge: Medical management  Dispo: Anticipated discharge pending clinical improvement.   Harvie Heck, MD  IMTS PGY-2 09/08/2020, 7:12 AM Pager: [phone] After 5pm on weekdays and 1pm on weekends: On Call pager [phone]

## 2020-09-08 NOTE — Progress Notes (Signed)
   Providing Compassionate, Quality Care - Together  NEUROSURGERY PROGRESS NOTE   S: No issues overnight. Pain more controlled  O: EXAM:  BP (!) 146/97 (BP Location: Left Arm)   Pulse 98   Temp 97.8 F (36.6 C) (Axillary)   Resp 18   Ht 6\' 7"  (2.007 m)   Wt 94.9 kg   SpO2 98%   BMI 23.57 kg/m   Awake, alert Bilateral upper extremities full strength Right lower extremity, BKA, strength 4/5 HF/KE/KF Left lower extremity 4/5 throughout Sensory intact light touch Incision clean dry intact   ASSESSMENT: 70 y.o.malewith   1.T10/11 cord compression with cord signal change -Due to previous discitis/epidural abscess with erosion of T11 vertebral body and likely instability at this level  -Status post T10-12 open decompression and fusion on 09/04/2020  There was clear osteodiscitis at T10-11, Intra-Op pathology/microbiology was sent, no frank abscess noted  PLAN: -PT/OT -TLSO when out of bed -Pain control -Consult ID for long-term antibiotics, so far cx NGTD -DVT prophylaxis (subcu heparin) -pain more controlled   Thank you for allowing me to participate in this patient's care.  Please do not hesitate to call with questions or concerns.   Elwin Sleight, [facility] Cell: [phone]

## 2020-09-08 NOTE — Progress Notes (Signed)
Tap water enema done, no stools noted.

## 2020-09-09 ENCOUNTER — Inpatient Hospital Stay (HOSPITAL_COMMUNITY): Payer: Medicare Other

## 2020-09-09 LAB — CBC
HCT: 29.3 % — ABNORMAL LOW (ref 39.0–52.0)
Hemoglobin: 9.1 g/dL — ABNORMAL LOW (ref 13.0–17.0)
MCH: 31.1 pg (ref 26.0–34.0)
MCHC: 31.1 g/dL (ref 30.0–36.0)
MCV: 100 fL (ref 80.0–100.0)
Platelets: 270 10*3/uL (ref 150–400)
RBC: 2.93 MIL/uL — ABNORMAL LOW (ref 4.22–5.81)
RDW: 16.1 % — ABNORMAL HIGH (ref 11.5–15.5)
WBC: 6.9 10*3/uL (ref 4.0–10.5)
nRBC: 0 % (ref 0.0–0.2)

## 2020-09-09 LAB — GLUCOSE, CAPILLARY
Glucose-Capillary: 112 mg/dL — ABNORMAL HIGH (ref 70–99)
Glucose-Capillary: 115 mg/dL — ABNORMAL HIGH (ref 70–99)
Glucose-Capillary: 106 mg/dL — ABNORMAL HIGH (ref 70–99)
Glucose-Capillary: 147 mg/dL — ABNORMAL HIGH (ref 70–99)
Glucose-Capillary: 82 mg/dL (ref 70–99)

## 2020-09-09 LAB — AEROBIC/ANAEROBIC CULTURE W GRAM STAIN (SURGICAL/DEEP WOUND)
Culture: NO GROWTH
Gram Stain: NONE SEEN

## 2020-09-09 LAB — BASIC METABOLIC PANEL
Anion gap: 11 (ref 5–15)
BUN: 25 mg/dL — ABNORMAL HIGH (ref 8–23)
CO2: 23 mmol/L (ref 22–32)
Calcium: 9 mg/dL (ref 8.9–10.3)
Chloride: 103 mmol/L (ref 98–111)
Creatinine, Ser: 1.78 mg/dL — ABNORMAL HIGH (ref 0.61–1.24)
GFR, Estimated: 41 mL/min — ABNORMAL LOW (ref >60)
Glucose, Bld: 105 mg/dL — ABNORMAL HIGH (ref 70–99)
Potassium: 4.4 mmol/L (ref 3.5–5.1)
Sodium: 137 mmol/L (ref 135–145)

## 2020-09-09 IMAGING — DX DG ABDOMEN 1V
2 series · 2 of 2 positions shown · non-contrast
Comparison: CT [DATE]

CLINICAL DATA: Abdominal pain

EXAM:
ABDOMEN - 1 VIEW

[abdomen kub (1 of 2)]
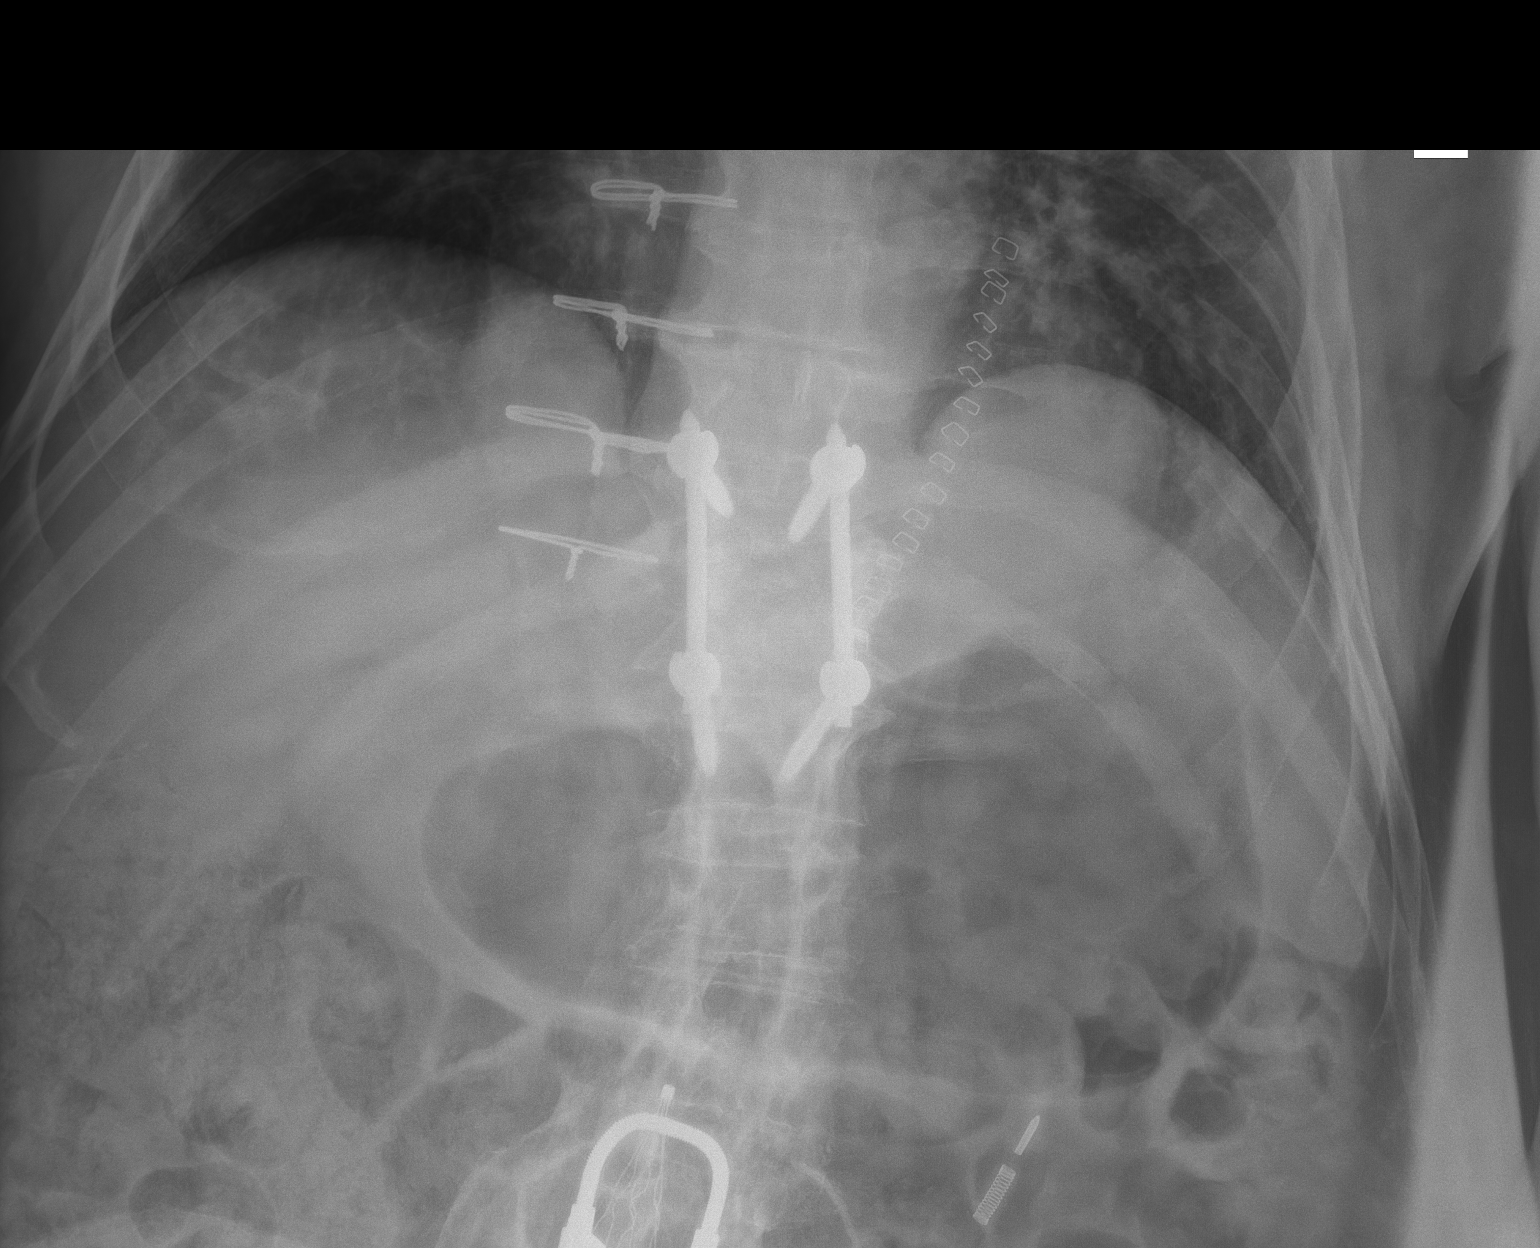

[abdomen kub (2 of 2)]
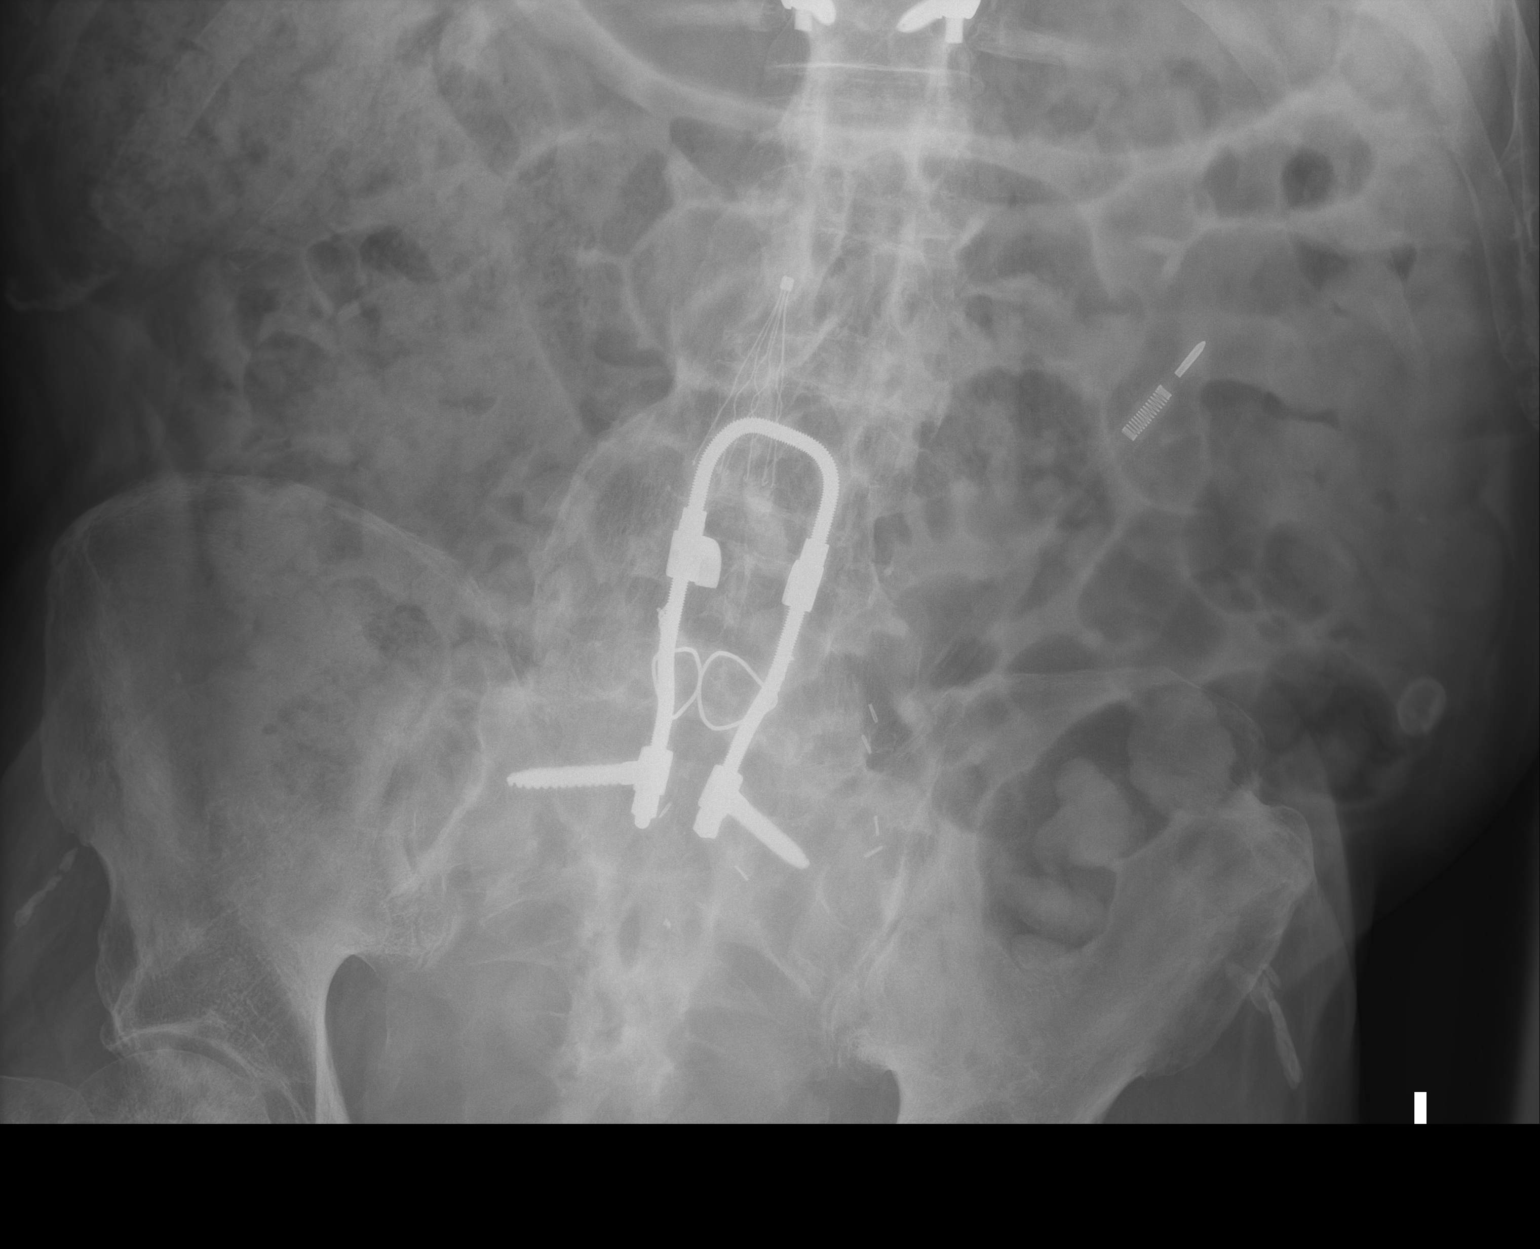

[2 of 2 positions shown; findings below may reference images not displayed]

FINDINGS: Posterior thoracolumbar fusion.  IVC filter noted.

Metallic tipped catheter like foreign body projects over the LEFT
abdomen.

No dilated loops of large or small bowel.
IMPRESSION: No evidence of bowel obstruction.

## 2020-09-09 MED ORDER — BISACODYL 10 MG RE SUPP
10.0000 mg | Freq: Once | RECTAL | Status: DC
  Filled 2020-09-09: qty 1

## 2020-09-09 MED ORDER — OXYCODONE HCL 5 MG PO TABS
5.0000 mg | ORAL_TABLET | ORAL | Status: DC | PRN
  Administered 2020-09-10: 5 mg via ORAL
  Filled 2020-09-09 (×2): qty 1

## 2020-09-09 MED ORDER — OXYCODONE HCL 5 MG PO TABS
10.0000 mg | ORAL_TABLET | Freq: Four times a day (QID) | ORAL | Status: DC
  Administered 2020-09-09 – 2020-09-12 (×11): 10 mg via ORAL
  Filled 2020-09-09 (×11): qty 2

## 2020-09-09 MED ORDER — NALOXEGOL OXALATE 12.5 MG PO TABS
12.5000 mg | ORAL_TABLET | Freq: Every day | ORAL | Status: DC
  Administered 2020-09-09 – 2020-09-10 (×2): 12.5 mg via ORAL
  Filled 2020-09-09 (×4): qty 1

## 2020-09-09 MED ORDER — MILK AND MOLASSES ENEMA: 1.0000 | Freq: Once | RECTAL | Status: DC

## 2020-09-09 NOTE — Progress Notes (Signed)
Subjective: HD 10 for [name] who is a 69 year old male with prior CVA, CKD IV, type II DM, chronic back pain s/p trauma and multiple spinal surgeries, chronic osteomyelitis at T10-T12 admitted for acute on chronic back pain following recent injury at OSH. Currently POD5 of T10-T12 fusion and spinal cord decompression.   Overnight, no acute events reported.  [name] was evaluated at bedside this morning.  He is continuing to endorse severe back pain and is unable to move around much. He still has not had a bowel movement yet.   Objective:  Vital signs in last 24 hours: Vitals:   09/08/20 1513 09/08/20 1817 09/08/20 1957 09/09/20 0307  BP: (!) 152/95 (!) 142/91 131/88 140/89  Pulse: 81 94 92 90  Resp: 17 20 17 18   Temp: 98.2 F (36.8 C) 98.3 F (36.8 C) 98.2 F (36.8 C) 98.5 F (36.9 C)  TempSrc: Oral Oral Oral Oral  SpO2: 95% 98% 95% 96%  Weight:      Height:       CBC Latest Ref Rng & Units 09/06/2020 09/05/2020 09/04/2020  WBC 4.0 - 10.5 K/uL 10.1 9.7 8.8  Hemoglobin 13.0 - 17.0 g/dL 8.8(L) 9.5(L) 9.9(L)  Hematocrit 39 - 52 % 27.6(L) 29.9(L) 31.5(L)  Platelets 150 - 400 K/uL 251 313 302   BMP Latest Ref Rng & Units 09/06/2020 09/05/2020 09/05/2020  Glucose 70 - 99 mg/dL 118(H) 119(H) 122(H)  BUN 8 - 23 mg/dL 29(H) 32(H) 30(H)  Creatinine 0.61 - 1.24 mg/dL 2.17(H) 2.12(H) 2.14(H)  BUN/Creat Ratio 6 - 22 (calc) - - -  Sodium 135 - 145 mmol/L 138 135 138  Potassium 3.5 - 5.1 mmol/L 5.1 4.5 6.0(H)  Chloride 98 - 111 mmol/L 107 105 108  CO2 22 - 32 mmol/L 25 20(L) 22  Calcium 8.9 - 10.3 mg/dL 8.9 9.0 9.0   Physical Exam  Constitutional: Chronically ill appearing male; Appears uncomfortable on exam.  GI: Nondistended, soft, tenderness to palpation in lower abdominal region; bowel sounds present  Musculoskeletal: Normal bulk and tone.  No peripheral edema noted. Neurological: Is alert and oriented x4, spontaneously moving all extremities    Assessment/Plan:  Principal Problem:   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 [facility]) Active Problems:   [name], right ([place])   [name], type 2) ([place])   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[place])   CKD (chronic kidney disease), stage IV ([place])   Physical deconditioning [name] is 69yo male (he/him) with prior CVA, CKD IV, type 2 diabetes mellitus, gout, chronic back pain s/p trauma, multiple spinal surgeries, R BKA, chronic osteomyelitis T10-T12 admitted 11/11 for acute on chronic back pain following recent injury at OSH, now POD5 T10-T12 fusion, spinal cord decompression, experiencing constipation due to continued opioid use.   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 Currently patient is on oxycontin 20mg  bid, oxycodone 10mg  q4h prn and dilaudid 2-3mg  q4h prn for pain control. However, still does not have adequate control.  Noted to have osteodiscitis without frank abscess intraoperatively. Aspirate cultures have been negative to date.Patient is on daptomycin, rifampin and ceftriaxone.  - Neurosurgery following, appreciate recommendations - ID consulted, appreciate recommendations  - Continue daptomycin 1000mg  qd and ceftriaxone  2g qd (day 5) - Continue rifampin 300mg  bid (day 4) - Rec for 8 wks of antibiotics for which he will need PICC line vs tunneled cath; will plan for this on Monday - F/u intraop cultures - Continue oxycontin 20mg  bid, schedule oxycodone 10mg  IR q6h with oxycodone 5mg  q4h prn for break through pain    Constipation 2/2 chronic opioid use Patient had tap water enema yesterday without any bowel movement. Abdomen is soft with positive bowel sounds. He has some tenderness to palpation of lower abdomen.   - Will obtain KUB, if negative, will give suppository vs movantik  - Continue miralax 17g bid and senokot-S 2 tab bid   Acute urinary retention  CKD IV Patient with foley in place for bladder decompression. Renal function slightly improved this morning.  - Continue foley for bladder decompression - Continue finasteride and tamsulosin - Intermittent labs to monitor renal function  Diabetes mellitus: Blood glucose has been relatively well controlled but does have some spikes in CBG's at mealtime. Currently on SSI  - Novolog 3U tid w/meals + SSI - Continue CBG monitoring   Diet: Regular  Fluids: None  DVT Prophylaxis: subQ heparin Code status: FULL   Prior to Admission Living Arrangement: Home  Anticipated Discharge Location: SNF Barriers to Discharge: Medical management  Dispo: Anticipated discharge pending clinical improvement.   Thomas Heck, MD  [facility] PGY-2 09/09/2020, 6:40 AM Pager: [phone] After 5pm on weekdays and 1pm on weekends: On Call pager [phone]

## 2020-09-09 NOTE — Progress Notes (Signed)
   Providing Compassionate, Quality Care - Together  NEUROSURGERY PROGRESS NOTE   S: No issues overnight. Refuses to get out of bed  O: EXAM:  BP (!) 156/97 (BP Location: Left Arm)   Pulse 78   Temp 98.6 F (37 C)   Resp 17   Ht 6\' 7"  (2.007 m)   Wt 94.9 kg   SpO2 95%   BMI 23.57 kg/m   Awake, alert Bilateral upper extremities full strength Right lower extremity, BKA, strength 4/5 HF/KE/KF Left lower extremity 4/5 throughout Sensory intact light touch Incision clean dry intact   ASSESSMENT: 69 y.o.malewith   1.T10/11 cord compression with cord signal change -Due to previous discitis/epidural abscess with erosion of T11 vertebral body and likely instability at this level  -Status post T10-12 open decompression and fusion on 09/04/2020  There was clear osteodiscitis at T10-11, Intra-Op pathology/microbiology was sent, no frank abscess noted  PLAN: -PT/OT -TLSO when out of bed -Pain control -ID for long-term antibiotics, so far cx NGTD -DVT prophylaxis (subcu heparin) -May benefit from rehab    Thank you for allowing me to participate in this patient's care.  Please do not hesitate to call with questions or concerns.   Elwin Sleight, [facility] Cell: [phone]

## 2020-09-10 ENCOUNTER — Encounter (HOSPITAL_COMMUNITY): Payer: Self-pay | Admitting: Student in an Organized Health Care Education/Training Program

## 2020-09-10 DIAGNOSIS — M4624 Osteomyelitis of vertebra, thoracic region: Principal | ICD-10-CM

## 2020-09-10 DIAGNOSIS — N184 Chronic kidney disease, stage 4 (severe): Secondary | ICD-10-CM

## 2020-09-10 DIAGNOSIS — E119 Type 2 diabetes mellitus without complications: Secondary | ICD-10-CM | POA: Diagnosis not present

## 2020-09-10 DIAGNOSIS — S24103A Unspecified injury at T7-T10 level of thoracic spinal cord, initial encounter: Secondary | ICD-10-CM | POA: Diagnosis not present

## 2020-09-10 DIAGNOSIS — Z89511 Acquired absence of right leg below knee: Secondary | ICD-10-CM | POA: Diagnosis not present

## 2020-09-10 LAB — GLUCOSE, CAPILLARY
Glucose-Capillary: 95 mg/dL (ref 70–99)
Glucose-Capillary: 122 mg/dL — ABNORMAL HIGH (ref 70–99)
Glucose-Capillary: 108 mg/dL — ABNORMAL HIGH (ref 70–99)
Glucose-Capillary: 125 mg/dL — ABNORMAL HIGH (ref 70–99)

## 2020-09-10 MED ORDER — BISACODYL 10 MG RE SUPP
10.0000 mg | Freq: Once | RECTAL | Status: DC
  Filled 2020-09-10: qty 1

## 2020-09-10 MED ORDER — ACETAMINOPHEN 500 MG PO TABS
1000.0000 mg | ORAL_TABLET | Freq: Three times a day (TID) | ORAL | Status: DC
  Administered 2020-09-10 – 2020-09-11 (×4): 1000 mg via ORAL
  Filled 2020-09-10 (×4): qty 2

## 2020-09-10 MED ORDER — MAGNESIUM CITRATE PO SOLN
1.0000 | Freq: Once | ORAL | Status: AC
  Administered 2020-09-10: 1 via ORAL
  Filled 2020-09-10: qty 296

## 2020-09-10 MED ORDER — HYDROMORPHONE HCL 1 MG/ML IJ SOLN
1.0000 mg | INTRAMUSCULAR | Status: DC | PRN
  Administered 2020-09-11 – 2020-09-12 (×3): 1 mg via INTRAVENOUS
  Administered 2020-09-13: 2 mg via INTRAVENOUS
  Administered 2020-09-13: 1 mg via INTRAVENOUS
  Administered 2020-09-13: 2 mg via INTRAVENOUS
  Administered 2020-09-13 – 2020-09-18 (×17): 1 mg via INTRAVENOUS
  Filled 2020-09-10: qty 1
  Filled 2020-09-10: qty 2
  Filled 2020-09-10 (×3): qty 1
  Filled 2020-09-10: qty 2
  Filled 2020-09-10 (×5): qty 1
  Filled 2020-09-10: qty 2
  Filled 2020-09-10 (×11): qty 1

## 2020-09-10 MED ORDER — MILK AND MOLASSES ENEMA
1.0000 | Freq: Once | RECTAL | Status: AC
  Administered 2020-09-11: 240 mL via RECTAL
  Filled 2020-09-10: qty 240

## 2020-09-10 MED ORDER — GABAPENTIN 300 MG PO CAPS
300.0000 mg | ORAL_CAPSULE | Freq: Three times a day (TID) | ORAL | Status: DC
  Administered 2020-09-10 – 2020-09-27 (×52): 300 mg via ORAL
  Filled 2020-09-10 (×52): qty 1

## 2020-09-10 MED ORDER — OXYCODONE HCL ER 20 MG PO T12A
20.0000 mg | EXTENDED_RELEASE_TABLET | Freq: Two times a day (BID) | ORAL | Status: DC
  Administered 2020-09-10 – 2020-09-20 (×21): 20 mg via ORAL
  Filled 2020-09-10 (×21): qty 1

## 2020-09-10 MED ORDER — ATORVASTATIN CALCIUM 80 MG PO TABS
80.0000 mg | ORAL_TABLET | Freq: Every day | ORAL | Status: DC
  Administered 2020-09-11 – 2020-10-03 (×23): 80 mg via ORAL
  Filled 2020-09-10 (×23): qty 1

## 2020-09-10 NOTE — Progress Notes (Signed)
Physical Therapy Treatment Patient Details Name: Thomas [name] MRN: [id number] DOB: [birth year] Today's Date: 09/10/2020    History of Present Illness 69 year old person living with a right below-knee amputation, chronic pain syndrome due to a complicated history of thoracic spine infections and degenerative disease, deconditioning after recent Covid infection and comorbidities admitted to our service for medical managements of another episode of chronic thoracic osteomyelitis and discitis with degenerative changes causing spinal cord compression. On 11/16, pt underwent surgical decompression around T11, fusion, and disc aspiration for microbiology    PT Comments    Pt supine on arrival, agreeable to therapy session with encouragement, with limited participation in transfers and anxious when encouraged to attempt EOB sitting, but agreeable to supine activities. Pt remains limited due to reported severe pain when HOB elevated >10 degrees and not agreeable to attempt log roll transfer to EOB despite encouragement and instruction on importance of participation in therapy. Pt with limited insight and limited carryover of back precautions from previous session. Pt performed rolling multiple reps L/R with Supervision for changing of bed linens. Pt performed supine BLE AROM therapeutic exercises as detailed below with good tolerance. Pt continues to benefit from PT services to progress toward functional mobility goals. D/C recs below remain appropriate.  Follow Up Recommendations  SNF     Equipment Recommendations  None recommended by PT    Recommendations for Other Services       Precautions / Restrictions Precautions Precautions: Fall Precaution Comments: hx of R BKA Required Braces or Orthoses: Spinal Brace Spinal Brace: Thoracolumbosacral orthotic;Other (comment) ("when out of bed" per neuro MD) Restrictions Weight Bearing Restrictions: No Other Position/Activity Restrictions: back  precautions    Mobility  Bed Mobility Overal bed mobility: Needs Assistance Bed Mobility: Rolling Rolling: Supervision         General bed mobility comments: Supervision for rolling L/R sides x4 reps ea side for changing bed sheets and fixing pads under him. Pt given verbal/visual encouragement for supine to sit transition but refused, recalling back pain from prior attempts; pt given further encouragement but became more agitated, reporting he would not try unless he has IV pain meds.  Transfers                    Ambulation/Gait                 Stairs             Wheelchair Mobility    Modified Rankin (Stroke Patients Only)       Balance                                            Cognition Arousal/Alertness: Awake/alert Behavior During Therapy: WFL for tasks assessed/performed Overall Cognitive Status: No family/caregiver present to determine baseline cognitive functioning Area of Impairment: Memory;Problem solving;Safety/judgement;Following commands;Awareness                 Orientation Level: Time;Disoriented to   Memory: Decreased short-term memory;Decreased recall of precautions Following Commands: Follows one step commands with increased time Safety/Judgement: Decreased awareness of safety;Decreased awareness of deficits   Problem Solving: Difficulty sequencing;Requires verbal cues;Requires tactile cues General Comments: pt slightly agitated about previous admissions to skilled nursing facilities and threatens violence if staff were to attempt to sit him up without IV pain meds      Exercises General  Exercises - Lower Extremity Ankle Circles/Pumps: AROM;Left;10 reps;Supine Short Arc Quad: AROM;Strengthening;Both;10 reps;Supine Hip ABduction/ADduction: AROM;Strengthening;Both;10 reps;Supine Straight Leg Raises: AROM;Strengthening;Both;10 reps;Supine Other Exercises Other Exercises: Hip ADduction; AROM;  strengthening; supine; 1x10 reps    General Comments General comments (skin integrity, edema, etc.): increased agitation this session      Pertinent Vitals/Pain Pain Assessment: Faces Faces Pain Scale: Hurts whole lot Pain Location: mild pain at rest "from shoulders to tailbone" but increases when HOB raised >15 degrees (pt unable to tolerate bed in chair position) Pain Descriptors / Indicators: Aching;Sore;Discomfort;Grimacing;Moaning Pain Intervention(s): Limited activity within patient's tolerance;Monitored during session;Premedicated before session;Repositioned;Patient requesting pain meds-RN notified   Vitals:   09/10/20 0915  BP: (!) 152/85  Pulse: (!) 102  Resp: 18  Temp: 98.9 F (37.2 C)  SpO2: 99%    Home Living                      Prior Function            PT Goals (current goals can now be found in the care plan section) Acute Rehab PT Goals Patient Stated Goal: pain control, get stronger and get home PT Goal Formulation: With patient Time For Goal Achievement: 09/19/20 Potential to Achieve Goals: Fair Progress towards PT goals: Not progressing toward goals - comment (frequency needs to be decreased; pt with limited partcipaton)    Frequency    Min 4X/week      PT Plan Frequency needs to be updated    Co-evaluation              AM-PAC PT "6 Clicks" Mobility   Outcome Measure  Help needed turning from your back to your side while in a flat bed without using bedrails?: None Help needed moving from lying on your back to sitting on the side of a flat bed without using bedrails?: A Lot Help needed moving to and from a bed to a chair (including a wheelchair)?: Total Help needed standing up from a chair using your arms (e.g., wheelchair or bedside chair)?: Total Help needed to walk in hospital room?: Total Help needed climbing 3-5 steps with a railing? : Total 6 Click Score: 10    End of Session   Activity Tolerance: Patient limited by  pain Patient left: in bed;with call bell/phone within reach;with bed alarm set Nurse Communication: Mobility status PT Visit Diagnosis: Unsteadiness on feet (R26.81);Muscle weakness (generalized) (M62.81)     Time: 6599-3570 PT Time Calculation (min) (ACUTE ONLY): 28 min  Charges:  $Therapeutic Exercise: 8-22 mins $Therapeutic Activity: 8-22 mins                     Emmalene Kattner P., PTA Acute Rehabilitation Services Pager: [phone] Office: [place] 09/10/2020, 3:17 PM

## 2020-09-10 NOTE — Progress Notes (Signed)
PT Cancellation Note  Patient Details Name: Thomas [name] MRN: [id number] DOB: 06/18/1951   Cancelled Treatment:    Reason Eval/Treat Not Completed: Other (comment) (pt sleeping upon staff arrival, attempted to awaken pt but not easily awoken. Will continue attempts later in day per PT POC as schedule permits.)   Carlene Coria 09/10/2020, 12:33 PM

## 2020-09-10 NOTE — Plan of Care (Signed)
  Problem: Education: Goal: Knowledge of General Education information will improve Description: Including pain rating scale, medication(s)/side effects and non-pharmacologic comfort measures Outcome: Progressing   Problem: Health Behavior/Discharge Planning: Goal: Ability to manage health-related needs will improve Outcome: Progressing   Problem: Clinical Measurements: Goal: Will remain free from infection Outcome: Progressing   Problem: Activity: Goal: Risk for activity intolerance will decrease Outcome: Progressing   Problem: Coping: Goal: Level of anxiety will decrease Outcome: Progressing   Problem: Elimination: Goal: Will not experience complications related to bowel motility Outcome: Progressing   

## 2020-09-10 NOTE — Progress Notes (Signed)
   Subjective:  Patient evaluated at bedside this AM. Endorses continued pain despite changes made yesterday. Denies BM.  Objective:  Vital signs in last 24 hours: Vitals:   09/09/20 1451 09/09/20 2200 09/10/20 0100 09/10/20 0500  BP: (!) 141/77 (!) 150/85 (!) 149/85   Pulse: 77 67 79   Resp: 18 16 17    Temp: 97.9 F (36.6 C) 97.8 F (36.6 C) 98 F (36.7 C)   TempSrc: Oral Axillary Oral   SpO2: 98% 98% 99%   Weight:    100.1 kg  Height:       Physical Exam: General: Laying in bed, in obvious pain Neuro: Awake, alert. Moving extremities appropriately. Psych: Angry mood. Normal speech.   Assessment/Plan: Thomas [name] is 69yo male (he/him) with prior CVA, CKD IV, type 2 diabetes mellitus, gout, chronic back pain s/p trauma, multiple spinal injuries, R BKA, chronic osteomyelitis T10-T12 admitted 11/11 for acute on chronic back pain following recent injury at OSH, now POD6 T10-T12 fusion and spinal cord decompression, continuing to manage pain control and constipation.  Principal Problem:   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 [facility]) Active Problems:   [name], right ([place])   [name], type 2) ([place])   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[facility])   CKD (chronic kidney disease), stage IV ([facility])   Physical deconditioning  #Acute on chronic back pain 2/2 injury #Chronic discitis, osteomyelitis T10-T12 #Hx MRSA epidural abscess POD6 T10-T12 fusion, spinal cord decompression. Multiple changes in pain regimen over the last few days without improvement. Will continue scheduled oxycodone, oxycontin w/ IV dilaudid PRN. Adding scheduled APAP q8hr with hopes in de-escalating opiate dependency once controlled. Aspirate cultures continue NGTD. Plan for long-term antibiotics per ID. Plan for PICC v tunneled cath placement today. Can consider palliative consult for goals of care, given his current health statue. Patient refuses physical therapy but continue to endorse pain.  Unclear on prognosis of patient's ability to ambulate if continues to refuse PT. - Neurosurg, ID following, appreciate recommendations - C/w daptomycin, ceftriaxone (day 6) - C/w rifampin (day 5) - Pain regimen: oxycontin 20mg  BID, oxycodone 10mg  q6h, APAP 1g q8h, dilaudid 1-2mg  q4 PRN - PT/OT  #Opioid-induced constipation KUB yesterday w/ large amount of stool. Over the weekend, tap water enema and current bowel regimen ineffective. Patient refused dulcolax suppository yesterday. Started on Movantik yesterday with hopes of improvement. Hopeful that pain control will eventually be adequate enough to be able to decrease opiate use. Will consider further enemas if needed. - C/w bisacodyl 10mg , Senokot-S BID, Miralax BID   #Acute urinary retention #CKD IV No acute changes, will intermittently check labs as needed for monitoring kidney function. - C/w Foley for bladder decompression - C/w finasteride, tamsulosin  #Type 2 diabetes mellitus CBG's controlled, will continue with current regimen - Novolog 3u TID + SSI - CBG monitoring  DIET: Regular IVF: none DVT PPX: subQ heparin BOWEL: as above CODE: FULL FAM COM: n/a  Prior to Admission Living Arrangement: Home Anticipated Discharge Location: SNF Barriers to Discharge: medical management Dispo: Anticipated discharge in approximately 2-3 day(s).   Thomas Dame, MD 09/10/2020, 6:25 AM Pager: [phone] After 5pm on weekdays and 1pm on weekends: On Call pager [phone]

## 2020-09-10 NOTE — Progress Notes (Signed)
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 Surgical specimen cultures have finalized without growth and fungal cultures remain pending. Given previous history of MRSA will continue rifampin, Daptomycin and ceftriaxone. Will require a prolonged course of treatment through 10/30/20. OPAT orders / Home Health orders below. Will arrange follow up in ID clinic. [place] for discharge from ID standpoint.  PLAN:  1. Continue ceftriaxone, rifampin and Daptomycin through 10/30/20 2. Monitor CK levels while on Daptomycin 3. Home Health and OPAT orders placed 4. Continue wound care per Neurosurgery 5. [place] for discharge from [facility]. Follow up in ID clinic.   Diagnosis:  Discitis / Osteomyelitis   Culture Result: Culture negative (previously MRSA)  Allergies  Allergen Reactions  . Other Other (See Comments)    Blood pressure issues Per [facility] hospital: Pt states he can only take these pain meds or else he gets very sick: Oxycontin,morphine,demerol, and dilaudid are the only pain meds pt states he can take.    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 2.4.2021: He also reports that he currently take Oxycontin.   Marland Kitchen Oxymorphone Other (See Comments)    Causes kidney problems  . Aspirin Nausea And Vomiting    Per [facility]  . Beta Vulgaris Nausea And Vomiting    Beets  . Buspirone Other (See Comments)    unknown  . Cabbage Nausea And Vomiting  . Codeine Other (See Comments)    unknown  . Dolobid [Diflunisal]     Unknown reaction  . Fish Allergy Nausea And Vomiting  . Fish-Derived Products Nausea And Vomiting  . Methadone     Unknown reaction  . Pentazocine Other (See Comments)    Unknown reaction  . Propoxyphene Other (See Comments)     Unknown reaction  . Shellfish Allergy Nausea And Vomiting  . Sulfa Antibiotics Hives  . Sulfasalazine Other (See Comments)    Unknown reaction  . Vistaril [Hydroxyzine]     Unknown reaction  . Amoxicillin Rash    Per [facility]    OPAT Orders Discharge antibiotics to be given via PICC line Discharge antibiotics: Daptomycin and ceftriaxone Per pharmacy protocol Aim for Vancomycin trough 15-20 or AUC 400-550 (unless otherwise indicated) Duration: 6 weeks  End Date: 10/30/20  [facility] Care Per Protocol:  Home health RN for IV administration and teaching; PICC line care and labs.    Labs weekly while on IV antibiotics: _X_ CBC with differential __ BMP _X_ CMP _X_ CRP _X_ ESR __ Vancomycin trough _X_ CK  __ Please pull PIC at completion of IV antibiotics _X_ Please leave PIC in place until doctor has seen patient or been notified  Fax weekly labs to [phone]  Clinic Follow Up Appt:  12/14 at 9:45 am with Dr. 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 acetaminophen  1,000 mg Oral Q8H  . allopurinol  100 mg Oral Daily  . amLODipine  10 mg Oral  Daily  . atorvastatin  20 mg Oral Daily  . benzonatate  100 mg Oral BID  . bisacodyl  10 mg Rectal Once  . Chlorhexidine Gluconate Cloth  6 each Topical Daily  . finasteride  5 mg Oral Daily  . gabapentin  300 mg Oral TID  . insulin aspart  0-15 Units Subcutaneous Q4H  . levothyroxine  50 mcg Oral QAC breakfast  . methocarbamol  750 mg Oral TID  . metoprolol tartrate  50 mg Oral BID  . naloxegol oxalate  12.5 mg Oral Daily  . oxyCODONE  10 mg Oral Q6H  . oxyCODONE  20 mg Oral Q12H  . polyethylene glycol  17 g Oral BID  . rifampin  300 mg Oral Q12H  . senna-docusate   2 tablet Oral BID  . sodium chloride flush  3 mL Intravenous Q12H  . tamsulosin  0.4 mg Oral Daily  . Vitamin D (Ergocalciferol)  50,000 Units Oral Q Sat    SUBJECTIVE:  Afebrile overnight with no acute events. Continues to have back pain.   Allergies  Allergen Reactions  . Other Other (See Comments)    Blood pressure issues Per [facility] hospital: Pt states he can only take these pain meds or else he gets very sick: Oxycontin,morphine,demerol, and dilaudid are the only pain meds pt states he can take.    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 2.4.2021: He also reports that he currently take Oxycontin.   Marland Kitchen Oxymorphone Other (See Comments)    Causes kidney problems  . Aspirin Nausea And Vomiting    Per [facility]  . Beta Vulgaris Nausea And Vomiting    Beets  . Buspirone Other (See Comments)    unknown  . Cabbage Nausea And Vomiting  . Codeine Other (See Comments)    unknown  . Dolobid [Diflunisal]     Unknown reaction  . Fish Allergy Nausea And Vomiting  . Fish-Derived Products Nausea And Vomiting  . Methadone     Unknown reaction  . Pentazocine Other (See Comments)    Unknown reaction  . Propoxyphene Other (See Comments)    Unknown reaction  . Shellfish Allergy Nausea And Vomiting  . Sulfa Antibiotics Hives  . Sulfasalazine Other (See Comments)    Unknown reaction  . Vistaril [Hydroxyzine]     Unknown reaction  . Amoxicillin Rash    Per [facility]     Review of Systems: Review of Systems  Constitutional: Negative for chills, fever and weight loss.  Respiratory: Negative for cough, shortness of breath and wheezing.   Cardiovascular: Negative for chest pain and leg swelling.  Gastrointestinal: Negative for abdominal pain, constipation, diarrhea, nausea and vomiting.  Musculoskeletal: Positive for back pain.  Skin: Negative for rash.      OBJECTIVE: Vitals:   09/09/20  2200 09/10/20 0100 09/10/20 0500 09/10/20 0915  BP: (!) 150/85 (!) 149/85  (!) 152/85  Pulse: 67 79  (!) 102  Resp: '16 17  18  ' Temp: 97.8 F (36.6 C) 98 F (36.7 C)  98.9 F (37.2 C)  TempSrc: Axillary Oral  Oral  SpO2: 98% 99%  99%  Weight:   100.1 kg   Height:       Body mass index is 24.86 kg/m.  Physical Exam Constitutional:      General: He is not in acute distress.    Appearance: He is well-developed.     Comments: Lying  flat in bed; pleasant.   Cardiovascular:     Rate and Rhythm: Normal rate and regular rhythm.     Heart sounds: Normal heart sounds.  Pulmonary:     Effort: Pulmonary effort is normal.     Breath sounds: Normal breath sounds.  Skin:    General: Skin is warm and dry.  Neurological:     Mental Status: He is alert and oriented to person, place, and time.     Lab Results Lab Results  Component Value Date   WBC 6.9 09/09/2020   HGB 9.1 (L) 09/09/2020   HCT 29.3 (L) 09/09/2020   MCV 100.0 09/09/2020   PLT 270 09/09/2020    Lab Results  Component Value Date   CREATININE 1.78 (H) 09/09/2020   BUN 25 (H) 09/09/2020   NA 137 09/09/2020   K 4.4 09/09/2020   CL 103 09/09/2020   CO2 23 09/09/2020    Lab Results  Component Value Date   ALT 12 08/29/2020   AST 23 08/29/2020   ALKPHOS 56 08/29/2020   BILITOT 0.4 08/29/2020     Microbiology: Recent Results (from the past 240 hour(s))  Surgical pcr screen     Status: Abnormal   Collection Time: 09/03/20 12:01 PM   Specimen: Nasal Mucosa; Nasal Swab  Result Value Ref Range Status   MRSA, PCR POSITIVE (A) NEGATIVE Final    Comment: RESULT CALLED TO, READ BACK BY AND VERIFIED WITH: R. Arcilla RN 13:50 09/03/20 ([email or web address])    Staphylococcus aureus POSITIVE (A) NEGATIVE Final    Comment: (NOTE) The Xpert SA Assay (FDA approved for NASAL specimens in patients 58 years of age and older), is one component of a comprehensive surveillance program. It is not intended to diagnose infection nor  to guide or monitor treatment. Performed at [facility], [place] [address] [place], [place] [address]   Fungus Culture With Stain     Status: None (Preliminary result)   Collection Time: 09/04/20  4:39 PM   Specimen: Soft Tissue, Other  Result Value Ref Range Status   Fungus Stain Final report  Final    Comment: (NOTE) Performed At: [facility] [place], [place] [id number] Rush Farmer MD KP:[id number]    Fungus (Mycology) Culture PENDING  Incomplete   Fungal Source TISSUE  Final    Comment: T10 THRU 11 Performed at [facility], [place] [address], [place], [place] [address]   Aerobic/Anaerobic Culture (surgical/deep wound)     Status: None   Collection Time: 09/04/20  4:39 PM   Specimen: Soft Tissue, Other  Result Value Ref Range Status   Specimen Description TISSUE  Final   Special Requests T10 THRU T11 DISC, PT ON VANC  Final   Gram Stain NO WBC SEEN NO ORGANISMS SEEN   Final   Culture   Final    No growth aerobically or anaerobically. Performed at [facility], [place] [address] [place], [place] [address]    Report Status 09/09/2020 FINAL  Final  Fungus Culture Result     Status: None   Collection Time: 09/04/20  4:39 PM  Result Value Ref Range Status   Result 1 Comment  Final    Comment: (NOTE) KOH/Calcofluor preparation:  no fungus observed. Performed At: [facility] [address] [place], [place] [id number] Rush Farmer MD GY:[id number]      Terri Piedra, [place] for Infectious Disease [facility]  09/10/2020  2:15 PM

## 2020-09-10 NOTE — TOC Transition Note (Addendum)
Transition of Care [facility]) - CM/SW Discharge Note   Patient Details  Name: Thomas [name] MRN: [id number] Date of Birth: June 17, 1951  Transition of Care [facility]) CM/SW Contact:  Curlene Labrum, RN Phone Number: 09/10/2020, 1:30 PM   Clinical Narrative:    Case management spoke with the patient at the bedside and he was sleepy and not able to remain awake enough to answer questions regarding his choice for SNf placement at this time.  When I spoke to him last week he requested [name].  I called and spoke with Broadus Delrico, [place] at [facility] and he said that the patient has exhausted all of his Medicare days and would need to be admitted under his Medicaid for care and possible long term placement.  Broadus Bailee, CM at [facility] states that he will speak to the facility treatment team to see if they are able to offer the patient an admission bed.  I also spoke with Narda Rutherford, CM at [facility] and they are retracting their offer for a SNf bed since they are not able to admit him under his medicaid for long-term placement.  Will continue to follow the patient for [facility] placement.     Final next level of care: [place] Barriers to Discharge: Continued Medical Work up   Patient Goals and CMS Choice Patient states their goals for this hospitalization and ongoing recovery are:: Patient plans to discharge to a skilled nursing facility.  Refuses placement back to [facility]. CMS Medicare.gov Compare Post Acute Care list provided to:: Patient Choice offered to / list presented to : Patient  Discharge Placement                       Discharge Plan and Services   Discharge Planning Services: CM Consult Post Acute Care Choice: [place]                               Social Determinants of Health (SDOH) Interventions     Readmission Risk Interventions Readmission Risk Prevention Plan 09/17/2019  Transportation Screening Complete  PCP or Specialist Appt  within 5-7 Days Not Complete  Not Complete comments snf vs home  Home Care Screening Complete  Medication Review (RN CM) Complete  Some recent data might be hidden

## 2020-09-10 NOTE — Progress Notes (Signed)
   Providing Compassionate, Quality Care - Together  NEUROSURGERY PROGRESS NOTE   S: No issues overnight.  Refuses to get out of bed and work with therapy  O: EXAM:  BP (!) 152/85 (BP Location: Left Arm)   Pulse (!) 102   Temp 98.9 F (37.2 C) (Oral)   Resp 18   Ht 6\' 7"  (2.007 m)   Wt 100.1 kg   SpO2 99%   BMI 24.86 kg/m   Awake, alert Bilateral upper extremities full strength Right lower extremity, BKA, strength 4/5 HF/KE/KF Left lower extremity 4/5 throughout Sensory intact light touch Incision clean dry intact   ASSESSMENT: 69 y.o.malewith   1.T10/11 cord compression with cord signal change -Due to previous discitis/epidural abscess with erosion of T11 vertebral body and likely instability at this level  -Status post T10-12 open decompression and fusion on 09/04/2020  There was clear osteodiscitis at T10-11, Intra-Op pathology/microbiology was sent, no frank abscess noted  PLAN: -PT/OT -TLSO when out of bed -Pain control -ID for long-term antibiotics, so far cx NGTD -DVT prophylaxis (subcu heparin) -May benefit from rehab -Discussed with him the need for ambulation and therapy, discussed with the nurse to time pain medication 30 minutes prior to therapy evaluation   Thank you for allowing me to participate in this patient's care.  Please do not hesitate to call with questions or concerns.   Elwin Sleight, [facility] Cell: [phone]

## 2020-09-11 ENCOUNTER — Inpatient Hospital Stay: Payer: Self-pay

## 2020-09-11 LAB — GLUCOSE, CAPILLARY
Glucose-Capillary: 88 mg/dL (ref 70–99)
Glucose-Capillary: 127 mg/dL — ABNORMAL HIGH (ref 70–99)
Glucose-Capillary: 155 mg/dL — ABNORMAL HIGH (ref 70–99)
Glucose-Capillary: 140 mg/dL — ABNORMAL HIGH (ref 70–99)

## 2020-09-11 MED ORDER — SODIUM CHLORIDE 0.9% FLUSH
10.0000 mL | Freq: Two times a day (BID) | INTRAVENOUS | Status: DC
  Administered 2020-09-12 – 2020-10-01 (×17): 10 mL

## 2020-09-11 MED ORDER — SODIUM CHLORIDE 0.9% FLUSH
10.0000 mL | INTRAVENOUS | Status: DC | PRN
  Administered 2020-09-21 – 2020-09-28 (×3): 10 mL

## 2020-09-11 MED ORDER — INSULIN ASPART 100 UNIT/ML ~~LOC~~ SOLN
0.0000 [IU] | Freq: Three times a day (TID) | SUBCUTANEOUS | Status: DC
  Administered 2020-09-11: 2 [IU] via SUBCUTANEOUS
  Administered 2020-09-11: 3 [IU] via SUBCUTANEOUS
  Administered 2020-09-12: 2 [IU] via SUBCUTANEOUS

## 2020-09-11 MED ORDER — NALOXEGOL OXALATE 25 MG PO TABS
25.0000 mg | ORAL_TABLET | Freq: Every day | ORAL | Status: DC
  Administered 2020-09-11 – 2020-10-03 (×23): 25 mg via ORAL
  Filled 2020-09-11 (×23): qty 1

## 2020-09-11 MED ORDER — ENOXAPARIN SODIUM 40 MG/0.4ML ~~LOC~~ SOLN
40.0000 mg | SUBCUTANEOUS | Status: DC
  Administered 2020-09-11 – 2020-10-03 (×22): 40 mg via SUBCUTANEOUS
  Filled 2020-09-11 (×23): qty 0.4

## 2020-09-11 NOTE — Progress Notes (Signed)
PT Cancellation Note  Patient Details Name: SIDI DZIKOWSKI MRN: [id number] DOB: 1951/03/01   Cancelled Treatment:    Reason Eval/Treat Not Completed: Patient declined, no reason specified (pt in supine reports he was in process of bowel movement, will need more time, not ready to be cleaned up, not wanting staff to get bed pan for him.)   Carlene Coria 09/11/2020, 5:11 PM

## 2020-09-11 NOTE — Progress Notes (Signed)
Peripherally Inserted Central Catheter Placement  The IV Nurse has discussed with the patient and/or persons authorized to consent for the patient, the purpose of this procedure and the potential benefits and risks involved with this procedure.  The benefits include less needle sticks, lab draws from the catheter, and the patient may be discharged home with the catheter. Risks include, but not limited to, infection, bleeding, blood clot (thrombus formation), and puncture of an artery; nerve damage and irregular heartbeat and possibility to perform a PICC exchange if needed/ordered by physician.  Alternatives to this procedure were also discussed.  Bard Power PICC patient education guide, fact sheet on infection prevention and patient information card has been provided to patient /or left at bedside.    PICC Placement Documentation  PICC Single Lumen 09/11/20 PICC Right Brachial 40 cm 0 cm (Active)  Indication for Insertion or Continuance of Line Prolonged intravenous therapies 09/11/20 1458  Exposed Catheter (cm) 0 cm 09/11/20 1458  Site Assessment Clean;Dry;Intact 09/11/20 1458  Line Status Flushed;Saline locked;Blood return noted 09/11/20 1458  Dressing Type Transparent;Securing device 09/11/20 1458  Dressing Status Clean;Dry;Intact 09/11/20 1458  Antimicrobial disc in place? Yes 09/11/20 1458  Safety Lock Not Applicable 09/90/68 9340  Dressing Intervention New dressing 09/11/20 1458  Dressing Change Due 09/18/20 09/11/20 1458       Enos Fling 09/11/2020, 3:00 PM

## 2020-09-11 NOTE — Progress Notes (Signed)
   Providing Compassionate, Quality Care - Together  NEUROSURGERY PROGRESS NOTE   S: No issues overnight, asking for dilaudid, told him we need to wean off of it  O: EXAM:  BP (!) 148/91 (BP Location: Left Arm)   Pulse 82   Temp 98.7 F (37.1 C) (Oral)   Resp 18   Ht 6\' 7"  (2.007 m)   Wt 95.5 kg   SpO2 98%   BMI 23.72 kg/m   Awake, alert, oriented Communicating approp Bilateral upper extremities full strength Right lower extremity, BKA, strength 4/5 HF/KE/KF Left lower extremity 4/5 throughout Sensory intact light touch Incision clean dry intact   ASSESSMENT: 69 y.o.malewith   1.T10/11 cord compression with cord signal change -Due to previous discitis/epidural abscess with erosion of T11 vertebral body and likely instability at this level  -Status post T10-12 open decompression and fusion on 09/04/2020  There was clear osteodiscitis at T10-11, Intra-Op pathology/microbiology was sent, no frank abscess noted  PLAN: -PT/OT -TLSO when out of bed -Pain control -ID for long-term antibiotics, so far cx NGTD -DVT prophylaxis (subcu heparin) -SNF pending   Thank you for allowing me to participate in this patient's care.  Please do not hesitate to call with questions or concerns.   Elwin Sleight, [facility] Cell: [phone]

## 2020-09-11 NOTE — Progress Notes (Signed)
   Subjective:  Patient evaluated at bedside this AM. Sleeping comfortably in bed. States he continues to have pain, but more controlled than yesterday. Denies recent BM, abdominal pain. Discussed enema today, patient agreeable.   Objective:  Vital signs in last 24 hours: Vitals:   09/10/20 0915 09/10/20 1641 09/10/20 1900 09/11/20 0400  BP: (!) 152/85 (!) 153/95 (!) 141/107 (!) 143/82  Pulse: (!) 102 74 97 88  Resp: 18 20 18 18   Temp: 98.9 F (37.2 C) 98.1 F (36.7 C) 98.2 F (36.8 C) 98.3 F (36.8 C)  TempSrc: Oral Oral Oral Oral  SpO2: 99% 98% 99% 98%  Weight:      Height:       Physical Exam: General: Resting comfortably in bed. No acute distress Abdomen: Soft, non-distended, non-tender. Normoactive bowel sounds. Neuro: Somnolent but oriented x4. Moving extremities appropriately.  Assessment/Plan: [name] is 69yo male (he/him) with prior CVA, CKD IV, type 2 diabetes mellitus, gout, chronic back pain s/p trauma, multiple spinal injuries, R BKA, chronic osteomyelitis admitted 11/11 for acute on chronic back pain following recent injury at OSH, now POD7 T10-T12 fusion and spinal cord decompression, continuing to titrate pain medications and bowel regimen.  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 Pain appears to be better controlled, although patient is much more somnolent this AM. Will be important to have continued discussions with patient regarding expectations of pain level moving forward. While in acute phase post-op, current regimen appropriate, especially given he is on chronic  opioids outside of the hospital. Multiple factors complicate pain regimen, including alertness to participate in physical therapy and opioid-induced constipation. Patient agreeable to physical therapy yesterday. Will continue to monitor pain regimen and titrate down as tolerated. - Neurosurgery, ID following, appreciate recs - C/w daptomycin, ceftriaxone, rifampin (end date: 10/30/20) - Current pain regimen: oxycontin 20mg  BID, oxycodone 10mg  q6h, APAP 1g q8h, dilaudid 1-2mg  q4 PRN - C/w robaxin 750mg  TID, gabapentin 300mg  TID - PT/OT  #Opioid-induced constipation Patient has not had BM since 11/15. Multiple different bowel regimens have been tried, including enemas, osmotic laxatives, and stimulant laxatives. This AM patient is not complaining of abdominal pain. Abdomen is soft, non-tender, non-distended. Increasing Movantik today given interaction with rifampin. Will also have milk and molasses enema today. Can consider SMOG enema tomorrow if no results today. - C/w bisacodyl, 10mg , Senokot-S BID, Miralax BID - Movantik 25mg  qd - Milk and molasses enema today    #Acute urinary retention #CKD IV No changes, continues to have good UOP. Will intermittently check labs. - C/w Foley for bladder decompression - C/w finasteride, tamsulosin  #Type 2 diabetes mellitus CBG's controlled on current regimen. - Novolog 3u TID WC + SSI - CBG monitoring BC, QHS  DIET: Regular IVF: n/a DVT PPX: Lovenox BOWEL: Bisacodyl, Senokot-S, Miralax, milk/molasses enema, Movantik CODE: FULL FAM COM: n/a  Prior to Admission Living Arrangement: Home Anticipated Discharge Location: SNF Barriers to Discharge: medical management Dispo: Anticipated discharge in approximately >5 day(s).   Thomas Dame, MD 09/11/2020, 7:06 AM Pager: [phone] After 5pm on weekdays and 1pm on weekends: On Call pager [phone]

## 2020-09-11 NOTE — Progress Notes (Signed)
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 On 11/16, pt underwent surgical decompression around T11, fusion, and disc aspiration for microbiology   OT comments  Pt making gradual progress towards OT goals. Pt agreeable to sit EOB today, but unable to sit EOB longer than 15 seconds citing pain (premedicated prior to session). Pt overall Supervision for all bed mobility and impulsive with movement. Pt requires max cues for spinal precautions with no evidence of attempted implementation of precautions. Pt Total A for TLSO brace mgmt with OT readjusting and educating pt on fit/wearing schedule. Plan to facilitate improved sitting tolerance for ADLs during next session.    Follow Up Recommendations  SNF    Equipment Recommendations  None recommended by OT    Recommendations for Other Services      Precautions / Restrictions Precautions Precautions: Fall;Back Precaution Booklet Issued: Yes (comment) Precaution Comments: hx of R BKA Required Braces or Orthoses: Spinal Brace Spinal Brace: Thoracolumbosacral orthotic Spinal Brace Comments: TLSO when OOB Restrictions Weight Bearing Restrictions: No       Mobility Bed Mobility Overal bed mobility: Needs Assistance Bed Mobility: Rolling;Supine to Sit;Sit to Supine Rolling: Supervision   Supine to sit: Supervision;HOB elevated Sit to supine: Supervision   General bed mobility comments: Pt agreeable to sit EOB, did so twice but did  only sat EOB <15 seconds citing pain. Disregard for back precautions, log rolling, etc with pt elevating HOB while legs off of bed into sitting position. No assistance given for any of these tasks. max cues for back precautions but no carryover noted  Transfers                 General transfer comment: pt declined due to pain    Balance Overall balance assessment: Needs assistance Sitting-balance support: Bilateral upper extremity supported;Feet supported Sitting balance-[name]: Fair Sitting balance - Comments: fair static sitting EOB (< 15 seconds)                                   ADL either performed or assessed with clinical judgement   ADL Overall ADL's : Needs assistance/impaired Eating/Feeding: Independent;Bed level Eating/Feeding Details (indicate cue type and reason): Finishing up breakfast on entry                                   General ADL Comments: Total A to don TLSO sitting EOB. Readjusted and educated pt on TLSO brace fit. Pt unable to sit EOB > 15 seconds for donning brace     Vision   Vision Assessment?: No apparent visual deficits   Perception     Praxis      Cognition Arousal/Alertness: Awake/alert Behavior During Therapy: Impulsive;Restless Overall Cognitive Status: No family/caregiver present to determine baseline cognitive functioning Area of Impairment: Memory;Problem solving;Safety/judgement;Following commands;Awareness                 Orientation Level: Time;Disoriented to   Memory: Decreased short-term memory;Decreased  recall of precautions Following Commands: Follows one step commands with increased time Safety/Judgement: Decreased awareness of safety;Decreased awareness of deficits Awareness: Emergent Problem Solving: Difficulty sequencing;Requires verbal cues;Requires tactile cues General Comments: Pt with impulsivity, decreased awareness of safety and back precautions. Decreased short term  memory, reports nursing had given pain meds "3 minutes" before therapist came in but had actually been > 30 min ago        Exercises     Shoulder Instructions       General Comments Pt requires max cues for spinal precautions with no evidence of attempting to implement back precautions.     Pertinent Vitals/ Pain       Pain Assessment: 0-10 Pain Score: 10-Worst pain ever Faces Pain Scale: Hurts little more Pain Location: back Pain Descriptors / Indicators: Aching;Sore;Discomfort;Grimacing;Moaning Pain Intervention(s): Limited activity within patient's tolerance;Monitored during session;Premedicated before session;Patient requesting pain meds-RN notified;Other (comment) (verbal pain score does not match faces pain score)  Home Living                                          Prior Functioning/Environment              Frequency  Min 2X/week        Progress Toward Goals  OT Goals(current goals can now be found in the care plan section)  Progress towards OT goals: Progressing toward goals  Acute Rehab OT Goals Patient Stated Goal: pain control, get stronger and get home OT Goal Formulation: With patient Time For Goal Achievement: 09/15/20 Potential to Achieve Goals: Good ADL Goals Pt Will Perform Lower Body Bathing: with min assist;sit to/from stand;sitting/lateral leans;with adaptive equipment Pt Will Perform Lower Body Dressing: with min assist;sitting/lateral leans;sit to/from stand;with adaptive equipment Pt Will Transfer to Toilet: with min assist;stand pivot transfer;bedside commode Additional ADL Goal #1: Pt will recall and apply spinal precautions to ADL routine  Plan Discharge plan remains appropriate    Co-evaluation                 AM-PAC OT "6 Clicks" Daily Activity     Outcome Measure   Help from another person eating meals?: None Help from another person taking care of personal grooming?: A Little Help from another person  toileting, which includes using toliet, bedpan, or urinal?: A Lot Help from another person bathing (including washing, rinsing, drying)?: A Lot Help from another person to put on and taking off regular upper body clothing?: A Little Help from another person to put on and taking off regular lower body clothing?: A Lot 6 Click Score: 16    End of Session Equipment Utilized During Treatment: Back brace  OT Visit Diagnosis: Unsteadiness on feet (R26.81);Other abnormalities of gait and mobility (R26.89);Muscle weakness (generalized) (M62.81);Pain   Activity Tolerance Patient limited by pain   Patient Left in bed;with bed alarm set;with call bell/phone within reach   Nurse Communication Mobility status;Patient requests pain meds        Time: 1049-1105 OT Time Calculation (min): 16 min  Charges: OT General Charges $OT Visit: 1 Visit OT Treatments $Therapeutic Activity: 8-22 mins  Layla Maw, OTR/L   Layla Maw 09/11/2020, 12:05 PM

## 2020-09-11 NOTE — Progress Notes (Signed)
Pt given enema and tolerated well. Having very small bowel movements at a time. Pt does not like sitting on bedpan due to pain. Foam placed on bottom for protection. Encouraged pt to drink previous mag citrate but he refused stating "it makes my stomach upset, I have a weak stomach."

## 2020-09-12 LAB — GLUCOSE, CAPILLARY
Glucose-Capillary: 123 mg/dL — ABNORMAL HIGH (ref 70–99)
Glucose-Capillary: 110 mg/dL — ABNORMAL HIGH (ref 70–99)
Glucose-Capillary: 187 mg/dL — ABNORMAL HIGH (ref 70–99)
Glucose-Capillary: 113 mg/dL — ABNORMAL HIGH (ref 70–99)

## 2020-09-12 MED ORDER — OXYCODONE HCL 5 MG PO TABS
10.0000 mg | ORAL_TABLET | Freq: Three times a day (TID) | ORAL | Status: DC
  Administered 2020-09-12 – 2020-09-14 (×6): 10 mg via ORAL
  Filled 2020-09-12 (×6): qty 2

## 2020-09-12 MED ORDER — LACTULOSE 10 GM/15ML PO SOLN
20.0000 g | Freq: Every day | ORAL | Status: AC
  Administered 2020-09-12: 20 g via ORAL
  Filled 2020-09-12: qty 30

## 2020-09-12 NOTE — Plan of Care (Signed)
Patient is s/p T10-T12 decompression POD#8. Pain still being controlled with scheduled medications as ordered. Have not given anything PRN so far - patient has been sleeping off and on. IV antibiotics given as ordered. Foam dsg to upper mid back clean dry and intact. Will continue to monitor and continue current POC.

## 2020-09-12 NOTE — Progress Notes (Signed)
Physical Therapy Treatment Patient Details Name: Thomas [name] MRN: [id number] DOB: [birth year] Today's Date: 09/12/2020    History of Present Illness 69 year old person living with a right below-knee amputation, chronic pain syndrome due to a complicated history of thoracic spine infections and degenerative disease, deconditioning after recent Covid infection and comorbidities admitted to our service for medical managements of another episode of chronic thoracic osteomyelitis and discitis with degenerative changes causing spinal cord compression. On 11/16, pt underwent surgical decompression around T11, fusion, and disc aspiration for microbiology    PT Comments    Pt supine on arrival, agreeable to therapy session and with improved participation and tolerance for mobility this session compared with previous session. Pt performed rolling and supine to sit bed mobility with mostly Supervision, one episode of min guard for safety with LLE placement. Pt performed seated scooting with TLSO donned along length of bed x2 trials to prepare for bed>chair transfers with good tolerance, needing rest break between trials due to increased mid-back pain. Pt performed seated/supine BLE therapeutic exercises, needing cues for compliance with back precautions, pt with limited to no awareness of back precautions and needs cues not to crunch/stretch knees up into chest using arms. Will attempt to initiate standing transfers vs squat pivot transfer to chair next session pending tolerance. Pt continues to benefit from PT services to progress toward functional mobility goals. D/C recs below remain appropriate.  Follow Up Recommendations  SNF     Equipment Recommendations  None recommended by PT    Recommendations for Other Services       Precautions / Restrictions Precautions Precautions: Fall;Back Precaution Booklet Issued: Yes (comment) Precaution Comments: hx of R BKA Required Braces or Orthoses: Spinal  Brace Spinal Brace: Thoracolumbosacral orthotic Spinal Brace Comments: TLSO when OOB Restrictions Weight Bearing Restrictions: No    Mobility  Bed Mobility Overal bed mobility: Needs Assistance Bed Mobility: Rolling;Supine to Sit;Sit to Supine Rolling: Supervision   Supine to sit: Supervision;HOB elevated Sit to supine: Min guard   General bed mobility comments: pt tolerated sitting EOB up to 5 minutes with TLSO donned once upright  Transfers Overall transfer level: Needs assistance   Transfers: Lateral/Scoot Transfers          Lateral/Scoot Transfers: Supervision General transfer comment: pt scooted to foot of bed/HOB x2 trials with supine rest between. Pt deferred standing attempts 2/2 pain  Ambulation/Gait                 Stairs             Wheelchair Mobility    Modified Rankin (Stroke Patients Only)       Balance Overall balance assessment: Needs assistance Sitting-balance support: Bilateral upper extremity supported;Feet supported Sitting balance-[name]: Poor Sitting balance - Comments: pt able to sit with BUE support and close Supervision but needs min guard at times with dynamic seated tasks due to posterior lean during therex (knee extension exercise) Postural control: Posterior lean     Standing balance comment: pt defers standing 2/2 pain                            Cognition Arousal/Alertness: Awake/alert Behavior During Therapy: Impulsive;Flat affect Overall Cognitive Status: No family/caregiver present to determine baseline cognitive functioning Area of Impairment: Problem solving;Safety/judgement;Following commands;Memory                 Orientation Level: Disoriented to;Time (pt unable to state how long he  has been in hospital)   Memory: Decreased short-term memory;Decreased recall of precautions Following Commands: Follows one step commands with increased time Safety/Judgement: Decreased awareness of  safety;Decreased awareness of deficits   Problem Solving: Difficulty sequencing;Requires verbal cues General Comments: Pt with impulsivity, decreased awareness of safety and back precautions.  Pt states "I was in prison just before I came for surgery."      Exercises General Exercises - Lower Extremity Ankle Circles/Pumps: AROM;Left;10 reps;Supine Long Arc Quad: AROM;Strengthening;Both;10 reps;Seated Hip Flexion/Marching: AROM;Strengthening;Both;10 reps;Supine    General Comments        Pertinent Vitals/Pain Pain Assessment: 0-10 Pain Score: 7  Pain Location: back Pain Descriptors / Indicators: Aching;Sore;Discomfort;Grimacing;Constant Pain Intervention(s): Monitored during session;Premedicated before session;Repositioned   Vitals:   09/12/20 0841 09/12/20 0856  BP: (!) 172/106 (!) 178/100  Pulse: 84   Resp: 16   Temp: 98.1 F (36.7 C)   SpO2: 100%     Home Living                      Prior Function            PT Goals (current goals can now be found in the care plan section) Acute Rehab PT Goals Patient Stated Goal: pain control, get stronger and get home PT Goal Formulation: With patient Time For Goal Achievement: 09/19/20 Potential to Achieve Goals: Fair Progress towards PT goals: Progressing toward goals    Frequency    Min 4X/week      PT Plan Current plan remains appropriate    Co-evaluation              AM-PAC PT "6 Clicks" Mobility   Outcome Measure  Help needed turning from your back to your side while in a flat bed without using bedrails?: None Help needed moving from lying on your back to sitting on the side of a flat bed without using bedrails?: A Little Help needed moving to and from a bed to a chair (including a wheelchair)?: A Lot Help needed standing up from a chair using your arms (e.g., wheelchair or bedside chair)?: Total Help needed to walk in hospital room?: Total Help needed climbing 3-5 steps with a railing? :  Total 6 Click Score: 12    End of Session Equipment Utilized During Treatment: Back brace Activity Tolerance: Patient tolerated treatment well Patient left: in bed;with call bell/phone within reach;with bed alarm set Nurse Communication: Mobility status PT Visit Diagnosis: Unsteadiness on feet (R26.81);Muscle weakness (generalized) (M62.81)     Time: 6812-7517 PT Time Calculation (min) (ACUTE ONLY): 20 min  Charges:  $Therapeutic Exercise: 8-22 mins                     Nesha Counihan P., PTA Acute Rehabilitation Services Pager: [phone] Office: [place] 09/12/2020, 1:43 PM

## 2020-09-12 NOTE — Plan of Care (Signed)
  Problem: Activity: Goal: Risk for activity intolerance will decrease Outcome: Progressing   Problem: Elimination: Goal: Will not experience complications related to bowel motility Outcome: Not Progressing   Problem: Pain Managment: Goal: General experience of comfort will improve Outcome: Progressing

## 2020-09-12 NOTE — Progress Notes (Signed)
   Subjective:  Patient is awake, alert and in pleasant mood this AM. He notes continued back pain secondary to infection and surgery. Notes he has significant history of "spine infections" requiring high doses of antibiotics and pain medications. Patient notes he had about 30 percent of his usual amount of stool yesterday. He has improved abdominal pressure.  Objective:  Vital signs in last 24 hours: Vitals:   09/11/20 1547 09/11/20 2026 09/12/20 0500 09/12/20 0515  BP: (!) 148/91 (!) 153/96  (!) 158/90  Pulse: 82 87  79  Resp: 18 16  18   Temp: 98.7 F (37.1 C) 98.6 F (37 C)  97.9 F (36.6 C)  TempSrc: Oral Oral  Oral  SpO2: 98% 100%  98%  Weight:   95.9 kg   Height:       Physical Exam: General: Elderly male, no acute distress Abdomen: Soft, non-distended. Mild generalized tenderness with palpation 2/2 back pain. Neuro: Awake, alert, oriented x4. Moving all extremities appropriately.  Assessment/Plan: Thomas [name] is 69yo male (he/him) with prior CVA, CKD IV, type 2 diabetes mellitus, gout, chronic back pain s/p trauma, multiple spinal injuries, R BKA, chronic osteomyelitis admitted 11/11 for acute on chronic back pain following recent injury at OSH, now POD8 T10-T12 fusion and spinal cord decompression, continuing to slowly wean down pain medications.  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 Patient overall doing well this morning, although mentions he continues to be in pain. Given the severity of his chronic and acute spinal issues, will need to continue to re-direct expectations for pain  control with patient. Patient will need to be able to participate in physical therapy in order to improve his functional status. Decreasing frequency of scheduled oxycodone today with plans on starting to wean dosage tomorrow. - Neurosurgery, ID following, appreciate recs - C/w daptomycin, ceftriaxone, rifampin (end date: 10/30/20) - Current pain regimen: oxycontin 20mg  BID, oxycodone 10mg  q8h, IV dilaudid 1-2mg  q4 PRN - C/w home robaxin 750mg  TID, gabapentin 300mg  TID  #Opioid-induced constipation After milk and molasses enema yesterday, patient reportedly had a few small bowel movements. Patient reports BM was roughly 30% of his regular BM. He mentions he feels less pressure in his abdomen. On exam, abdomen soft, non-distended. Will continue with current regimen, can consider SMOG enema if no further BM. - C/w Senokot-S BID, Miralax BID - Movantik 25mg  qd  #Type 2 diabetes mellitus CBG's well-controlled on current regimen. - C/w Novolog 3u TID WC + SSI - CBG monitoring BC, QHS  DIET: Regular IVF: n/a DVT PPX: Lovenox BOWEL: Senokot-S, Miralax, Movantik CODE: FULL FAM COM: n/a  Prior to Admission Living Arrangement: Home Anticipated Discharge Location: SNF Barriers to Discharge: medical management Dispo: Anticipated discharge in approximately >5 day(s).   [name], MD 09/12/2020, 6:43 AM Pager: [phone] After 5pm on weekdays and 1pm on weekends: On Call pager [phone]

## 2020-09-12 NOTE — Progress Notes (Signed)
Pharmacy Antibiotic Note  Thomas [name] is a 69 y.o. male admitted on 08/29/2020 s/p spinal cord decompression.  Pharmacy has been consulted for daptomycin dosing for discitis/epidural abscess s/p debridement.  Baseline CK wnl at 33. Patient remains afebrile, renal function stable. PICC placed on 11/23. OPAT entered with stop date 10/30/2020. Noted on atorvastatin 80mg  daily.  Plan: -Daptomycin 1000mg  IV q24h -Ceftriaxone 2g IV q24h -Rifampin 300mg  PO q12h  -Will follow renal function, weekly CK, cultures and clinical progress   Height: 6\' 7"  (200.7 cm) Weight: 95.9 kg (211 lb 6.7 oz) IBW/kg (Calculated) : 93.7  Temp (24hrs), Avg:98.3 F (36.8 C), Min:97.9 F (36.6 C), Max:98.7 F (37.1 C)  Recent Labs  Lab 09/05/20 1254 09/06/20 0426 09/09/20 0621  WBC  --  10.1 6.9  CREATININE 2.12* 2.17* 1.78*    Estimated Creatinine Clearance: 53.4 mL/min (A) (by C-G formula based on SCr of 1.78 mg/dL (H)).    Allergies  Allergen Reactions  . Other Other (See Comments)    Blood pressure issues Per [facility] hospital: Pt states he can only take these pain meds or else he gets very sick: Oxycontin,morphine,demerol, and dilaudid are the only pain meds pt states he can take.    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 2.4.2021: He also reports that he currently take Oxycontin.   Marland Kitchen Oxymorphone Other (See Comments)    Causes kidney problems  . Aspirin Nausea And Vomiting    Per [facility]  . Beta Vulgaris Nausea And Vomiting    Beets  . Buspirone Other (See Comments)    unknown  . Cabbage Nausea And Vomiting  . Codeine Other (See Comments)    unknown  . Dolobid [Diflunisal]     Unknown reaction  . Fish Allergy Nausea And Vomiting  . Fish-Derived Products Nausea And Vomiting  . Methadone     Unknown reaction  . Pentazocine Other (See Comments)    Unknown reaction  . Propoxyphene Other (See Comments)     Unknown reaction  . Shellfish Allergy Nausea And Vomiting  . Sulfa Antibiotics Hives  . Sulfasalazine Other (See Comments)    Unknown reaction  . Vistaril [Hydroxyzine]     Unknown reaction  . Amoxicillin Rash    Per [facility]    Thank you for allowing pharmacy to be a part of this patient's care.  Berenice Bouton, PharmD PGY2 Pharmacy Resident Phone between 7 am - 3:30 pm: [phone]  Please check AMION for all [place] phone numbers After 10:00 PM, call [place] [phone]

## 2020-09-12 NOTE — Progress Notes (Signed)
   Providing Compassionate, Quality Care - Together  NEUROSURGERY PROGRESS NOTE   S: No issues overnight. Small BM noted  O: EXAM:  BP (!) 178/100 (BP Location: Left Arm)   Pulse 84   Temp 98.1 F (36.7 C) (Oral)   Resp 16   Ht 6\' 7"  (2.007 m)   Wt 95.9 kg   SpO2 100%   BMI 23.82 kg/m   Awake, alert, oriented Communicating approp Bilateral upper extremities full strength Right lower extremity, BKA, strength 4/5 HF/KE/KF Left lower extremity 4/5 throughout Sensory intact light touch Incision clean dry intact   ASSESSMENT: 69 y.o.malewith   1.T10/11 cord compression with cord signal change -Due to previous discitis/epidural abscess with erosion of T11 vertebral body and likely instability at this level  -Status post T10-12 open decompression and fusion on 09/04/2020  There was clear osteodiscitis at T10-11, Intra-Op pathology/microbiology was sent, no frank abscess noted  PLAN: -PT/OT -TLSO when out of bed -Pain control -ID for long-term antibiotics, so far cx NGTD -DVT prophylaxis (subcu heparin) -SNF pending -overall stable   Thank you for allowing me to participate in this patient's care.  Please do not hesitate to call with questions or concerns.   Elwin Sleight, [facility] Cell: [phone]

## 2020-09-13 DIAGNOSIS — M5459 Other low back pain: Secondary | ICD-10-CM

## 2020-09-13 DIAGNOSIS — M5440 Lumbago with sciatica, unspecified side: Secondary | ICD-10-CM | POA: Diagnosis present

## 2020-09-13 DIAGNOSIS — R5381 Other malaise: Secondary | ICD-10-CM

## 2020-09-13 LAB — GLUCOSE, CAPILLARY: Glucose-Capillary: 112 mg/dL — ABNORMAL HIGH (ref 70–99)

## 2020-09-13 MED ORDER — LACTULOSE 10 GM/15ML PO SOLN
20.0000 g | Freq: Two times a day (BID) | ORAL | Status: AC
  Administered 2020-09-13 (×2): 20 g via ORAL
  Filled 2020-09-13 (×2): qty 30

## 2020-09-13 NOTE — Progress Notes (Signed)
  Date: 09/13/2020  Patient name: [name] record number: [id number]  Date of birth: Dec 04, 1950        I have seen and evaluated this patient and I have discussed the plan of care with the house staff. Please see Dr. Margy Clarks note for complete details. I concur with her findings and plan.    Sid Falcon, MD 09/13/2020, 12:22 PM

## 2020-09-13 NOTE — Plan of Care (Signed)
No acute changes since the previous night that I took care of him. Will continue to monitor and continue current POC. 

## 2020-09-13 NOTE — Progress Notes (Signed)
Subjective: HD 15 Overnight, no acute events reported.  Mr Aras Albarran was evaluated at bedside this morning. He reports improved pain control this morning and was able to work with physical therapy yesterday. He notes feeling much improved from initial presentation. He has not had any further bowel movements and is requesting lactulose as this has helped in the past.   Objective:  Vital signs in last 24 hours: Vitals:   09/12/20 2021 09/13/20 0500 09/13/20 0510 09/13/20 0749  BP: (!) 150/96  (!) 152/97 (!) 151/97  Pulse: 86  87 94  Resp: 17  17 20   Temp: 98.1 F (36.7 C)  97.9 F (36.6 C) 98.2 F (36.8 C)  TempSrc: Oral  Oral Oral  SpO2: 98%  97% 97%  Weight:  105.7 kg    Height:       CBC Latest Ref Rng & Units 09/09/2020 09/06/2020 09/05/2020  WBC 4.0 - 10.5 K/uL 6.9 10.1 9.7  Hemoglobin 13.0 - 17.0 g/dL 9.1(L) 8.8(L) 9.5(L)  Hematocrit 39 - 52 % 29.3(L) 27.6(L) 29.9(L)  Platelets 150 - 400 K/uL 270 251 313   BMP Latest Ref Rng & Units 09/09/2020 09/06/2020 09/05/2020  Glucose 70 - 99 mg/dL 105(H) 118(H) 119(H)  BUN 8 - 23 mg/dL 25(H) 29(H) 32(H)  Creatinine 0.61 - 1.24 mg/dL 1.78(H) 2.17(H) 2.12(H)  BUN/Creat Ratio 6 - 22 (calc) - - -  Sodium 135 - 145 mmol/L 137 138 135  Potassium 3.5 - 5.1 mmol/L 4.4 5.1 4.5  Chloride 98 - 111 mmol/L 103 107 105  CO2 22 - 32 mmol/L 23 25 20(L)  Calcium 8.9 - 10.3 mg/dL 9.0 8.9 9.0   Physical Exam  Constitutional: Elderly male. No distress.  HENT: NCAT, EOMI, MMM Cardiovascular: RRR, S1 and S2 present, no M/R/G.  Distal pulses intact Respiratory: No respiratory distress,  Lungs are clear to auscultation bilaterally. GI: Nondistended, soft, tenderness to palpation in epigastric region; bowel sounds present  Musculoskeletal: Normal bulk and tone.  No peripheral edema noted. Neurological: Is alert and oriented x4, no apparent focal deficits noted. Skin: Warm and dry.  No rash, erythema, lesions noted. Psychiatric: Normal mood  and affect. Behavior is normal. Judgment and thought content normal.   Assessment/Plan:  Principal Problem:   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 [facility]) Active Problems:   [name], right ([place])   [name], type 2) ([place])   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[place])   CKD (chronic kidney disease), stage IV [facility])   Physical deconditioning Mr. Parley Pidcock is a 69 year old male with PMHx of prior CVA, CKDIV, type 2 DM, gout, chronic back pain s/p trauma and multiple spinal injuries, R BKA, chronic osteomyelitis at T10-T12 admitted on 11/11 for acute on chronic back pain following recent injury and is s/p T10-T12 fusion and spinal cord decompression.   Acute on chronic back pain 2/2 injury  Chronic discitis, osteomyelitis T10-T12 Hx of MRSA epidural abscess Patient is currently POD 9 from T10-T12 fusion and spinal cord decompression. Currently is doing well and notes that pain control is improved on current regimen. He was able to work with physical therapy a bit more yesterday. Will continue with current regimen at this time.  - Neurosurgery and ID following, appreciate recommendations - Continue daptomycin, ceftriaxone and rifampin (end date: 10/30/20) - Continue with current pain regimen: oxycontin 20mg  bid, oxycodone 10mg  q8h, IV dilaudid 1-2mg  q4h prn - Continue home robaxin 750mg  tid and gabapentin 300mg  tid  Opioid induced constipation Patient notes that he did not have any further bowel movements after 11/23. He endorses abdominal pressure and has epigastric tenderness to palpation although abdomen remains nondistended and soft with normal bowel sounds. He notes that lactulose has worked well for him in the past. Will consider addition of this vs SMOG enema tomorrow if no improvement with lactulose.  - Continue senokot-s and miralax bid  - Lactulose 20mg  bid - Movantik 25mg  daily  Hx of type II DM:  HbA1c 5.8 on this admission. His CBG's have been well  controlled during this hospitalization. SSI and frequent CBG monitoring discontinued yesterday. Can continue with AM CBG checks, and if elevated, will consider addition of long acting insulin - Continue AM CBG checks   Diet: Regular Fluids: None DVT Prophylaxis: Lovenox Code status: FULL  Prior to Admission Living Arrangement: Home Anticipated Discharge Location: SNF Barriers to Discharge: continued medical management/dispo  Dispo: Anticipated discharge in approximately 4-5 day(s).   Harvie Heck, MD  IMTS PGY-2 09/13/2020, 10:21 AM Pager: [phone] After 5pm on weekdays and 1pm on weekends: On Call pager [phone]

## 2020-09-14 LAB — GLUCOSE, CAPILLARY: Glucose-Capillary: 98 mg/dL (ref 70–99)

## 2020-09-14 MED ORDER — OXYCODONE HCL 5 MG PO TABS
7.5000 mg | ORAL_TABLET | Freq: Three times a day (TID) | ORAL | Status: DC
  Administered 2020-09-14 – 2020-09-17 (×9): 7.5 mg via ORAL
  Filled 2020-09-14 (×9): qty 2

## 2020-09-14 MED ORDER — BISACODYL 10 MG RE SUPP
10.0000 mg | Freq: Every day | RECTAL | Status: DC | PRN
  Administered 2020-09-14 – 2020-10-03 (×7): 10 mg via RECTAL
  Filled 2020-09-14 (×9): qty 1

## 2020-09-14 NOTE — Progress Notes (Signed)
Physical Therapy Treatment Patient Details Name: Thomas [name] MRN: [id number] DOB: [birth year] Today's Date: 09/14/2020    History of Present Illness 69 year old person living with a right below-knee amputation, chronic pain syndrome due to a complicated history of thoracic spine infections and degenerative disease, deconditioning after recent Covid infection and comorbidities admitted to our service for medical managements of another episode of chronic thoracic osteomyelitis and discitis with degenerative changes causing spinal cord compression. On 11/16, pt underwent surgical decompression around T11, fusion, and disc aspiration for microbiology    PT Comments    Pt supine on arrival, c/o abdominal/bowel discomfort, RN aware, pt agreeable to therapy session and with fair tolerance for bed mobility and pre-transfer training. Pt performed bed mobility with up to min guard assist, and needed min guard at most for seated lateral scooting along EOB. Pt needed rest break between brief scooting trials due to reported severe back pain, RN notified. Pt needed reinforcement for 3/3 back precautions, and given handout. Pt performed supine/seated/sidelying BLE AROM therapeutic exercises with good tolerance as detailed below. Pt continues to benefit from skilled rehab in a post acute setting to maximize functional gains.   Follow Up Recommendations  SNF     Equipment Recommendations  None recommended by PT (defer to next facility)    Recommendations for Other Services       Precautions / Restrictions Precautions Precautions: Fall;Back Precaution Booklet Issued: Yes (comment) Precaution Comments: reviewed back precs, handout given Required Braces or Orthoses: Spinal Brace Spinal Brace: Thoracolumbosacral orthotic Spinal Brace Comments: TLSO when OOB Restrictions Weight Bearing Restrictions: No Other Position/Activity Restrictions: hx of R BKA, back precautions    Mobility  Bed  Mobility Overal bed mobility: Needs Assistance Bed Mobility: Rolling;Sidelying to Sit;Sit to Sidelying Rolling: Supervision Sidelying to sit: Supervision     Sit to sidelying: Min guard General bed mobility comments: pt tolerated sitting EOB 3-5 minutes, with TLSO donned once upright, performed log rolling x2 reps with sidelying rest break between seated scooting trials; pt at times with lateral LOB each side needing up to min guard for seated balance  Transfers Overall transfer level: Needs assistance   Transfers: Lateral/Scoot Transfers          Lateral/Scoot Transfers: Min guard;From elevated surface General transfer comment: pt scooted to foot of bed/HOB x1 trials (sidelying resting after scooting toward foot of bed); Pt deferred standing attempts 2/2 pain, encouraged pt to attempt next session vs lateral scoot to chair  Ambulation/Gait                 Stairs             Wheelchair Mobility    Modified Rankin (Stroke Patients Only)       Balance Overall balance assessment: Needs assistance Sitting-balance support: Single extremity supported;Feet supported (1 foot support (BKA)) Sitting balance-[name]: Poor Sitting balance - Comments: pt at times with lateral LOB needing min/min guard for support at trunk Postural control: Right lateral lean;Left lateral lean                                  Cognition Arousal/Alertness: Awake/alert Behavior During Therapy: Impulsive;Flat affect Overall Cognitive Status: No family/caregiver present to determine baseline cognitive functioning Area of Impairment: Problem solving;Safety/judgement;Following commands;Memory                 Orientation Level: Disoriented to;Time   Memory: Decreased short-term memory;Decreased  recall of precautions Following Commands: Follows one step commands with increased time Safety/Judgement: Decreased awareness of safety;Decreased awareness of deficits    Problem Solving: Difficulty sequencing;Requires verbal cues General Comments: Pt with impulsivity, decreased awareness of safety and back precautions. Pt reports he wasn't aware of back precautions although we have reviewed them each session. Handout given to reinforce back precs      Exercises General Exercises - Lower Extremity Ankle Circles/Pumps: AROM;Left;10 reps;Supine Gluteal Sets: AROM;Strengthening;Both;5 reps;Supine (encouraged to perform x15 TID) Long Arc Quad: AROM;Strengthening;Both;10 reps;Seated Heel Slides: AROM;Left;10 reps;Supine Hip ABduction/ADduction: AROM;Strengthening;Left;5 reps;Supine Other Exercises Other Exercises: sidelying RLE AROM: hip abduction, hip extension, knee flex/ext 1x10 reps    General Comments        Pertinent Vitals/Pain Pain Assessment: 0-10 Pain Score: 8  Faces Pain Scale: Hurts worst Pain Location: back Pain Descriptors / Indicators: Aching;Sore;Discomfort;Grimacing;Constant Pain Intervention(s): Monitored during session;Repositioned;Patient requesting pain meds-RN notified (cues for precautions)   Vitals:   09/14/20 1416  BP: (!) 154/106  Pulse: 84  Resp: 17  Temp: 98.3 F (36.8 C)  SpO2: 92%  UTA seated BP 2/2 pt impulsivity and unable to leave pt seated EOB to grab machine due to safety concerns  Home Living                      Prior Function            PT Goals (current goals can now be found in the care plan section) Acute Rehab PT Goals Patient Stated Goal: pain control, get stronger and get home PT Goal Formulation: With patient Time For Goal Achievement: 09/19/20 Potential to Achieve Goals: Fair Progress towards PT goals: Progressing toward goals    Frequency    Min 4X/week      PT Plan Current plan remains appropriate    Co-evaluation              AM-PAC PT "6 Clicks" Mobility   Outcome Measure  Help needed turning from your back to your side while in a flat bed without using  bedrails?: None Help needed moving from lying on your back to sitting on the side of a flat bed without using bedrails?: A Little Help needed moving to and from a bed to a chair (including a wheelchair)?: A Little (lateral scoot) Help needed standing up from a chair using your arms (e.g., wheelchair or bedside chair)?: Total Help needed to walk in hospital room?: Total Help needed climbing 3-5 steps with a railing? : Total 6 Click Score: 13    End of Session Equipment Utilized During Treatment: Back brace Activity Tolerance: Patient tolerated treatment well Patient left: in bed;with call bell/phone within reach;with bed alarm set Nurse Communication: Mobility status;Other (comment) (RN ([place]) notified pt requesting pain meds and IV beeping) PT Visit Diagnosis: Unsteadiness on feet (R26.81);Muscle weakness (generalized) (M62.81)     Time: 2505-3976 PT Time Calculation (min) (ACUTE ONLY): 18 min  Charges:  $Therapeutic Exercise: 8-22 mins                     Lindon Kiel P., PTA Acute Rehabilitation Services Pager: [phone] Office: [place] 09/14/2020, 3:04 PM

## 2020-09-14 NOTE — TOC Progression Note (Signed)
Transition of Care [facility]) - Progression Note    Patient Details  Name: Thomas [name] MRN: [id number] Date of Birth: January 24, 1951  Transition of Care [facility]) CM/SW [place], RN Phone Number: 09/14/2020, 4:00 PM  Clinical Narrative:    Case management spoke with the patient at the bedside regarding SNF placement and the patient chose [facility] in [place], [place] for [facility] placement.  The patient has a PICC line present in the Right upper arm.  I was unable to reach the Kirke Shaggy, CM at [facility] facility - but left a message regarding securing a bed for the patient for placement.  Will continue to follow for patient's placement at the SNF facility.     Expected Discharge Plan: [place] Barriers to Discharge: Continued Medical Work up  Expected Discharge Plan and Services Expected Discharge Plan: [place]   Discharge Planning Services: CM Consult Post Acute Care Choice: [place] Living arrangements for the past 2 months: [place]                                       Social Determinants of Health (SDOH) Interventions    Readmission Risk Interventions Readmission Risk Prevention Plan 09/17/2019  Transportation Screening Complete  PCP or Specialist Appt within 5-7 Days Not Complete  Not Complete comments snf vs home  Home Care Screening Complete  Medication Review (RN CM) Complete  Some recent data might be hidden

## 2020-09-14 NOTE — Progress Notes (Signed)
  Date: 09/14/2020  Patient name: Thomas [name]  Medical record number: [id number]  Date of birth: 08-26-1951   This patient's plan of care was discussed with the house staff. Please see Dr. Lorelee Cover note for complete details. I concur with their findings.   Sid Falcon, MD 09/14/2020, 1:43 PM

## 2020-09-14 NOTE — Progress Notes (Signed)
   Subjective:  Patient evaluated at bedside this morning. He is somnolent on initial presentation. He notes that he still has some pressure on his abdomen in the periumbilical area but is improving as he did have some bowel movements this AM. He endorses that current pain regimen is working well.   Objective:  Vital signs in last 24 hours: Vitals:   09/13/20 1427 09/13/20 1950 09/14/20 0349 09/14/20 0500  BP: (!) 157/90 (!) 144/91 (!) 142/92   Pulse: 78 71 87   Resp: 20 18 18    Temp: 98.2 F (36.8 C) 98.9 F (37.2 C) 99 F (37.2 C)   TempSrc: Oral Oral Oral   SpO2: 96% 95% 96%   Weight:    97.9 kg  Height:       Physical Exam: General: Elderly male, no acute distress Abdomen: Soft, non-distended. Tender to palpation LLQ. Neuro: Somnolent but easily arousable. Moving extremities appropriately.  Assessment/Plan: Mr. Eskelson is 69yo male (h/ehim) with chronic back pain s/p trauma in his 20's and multiple spinal injuries/subsequent surgeries, chronic osteomyelitis T10-T12, prior CVA, CKD IV, type 2 diabetes mellitus, gout, R BKA admitted 11/11 for acute on chronic back pain following recent injury at OSH, now POD10 T10-T12 fusion and spinal cord decompression, now having small bowel movements with slow wean of pain medications.  Principal Problem:   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 [facility]) Active Problems:   [name], right ([place])   [name], type 2) ([place])   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[facility])   CKD (chronic kidney disease), stage IV ([facility])   Physical deconditioning   Acute midline low back pain with sciatica  #Acute on chronic back pain 2/2 injury #Chronic discitis, osteomyelitis T10-T12 #Hx MRSA epidural abscess POD10 T10-T12 fusion, spinal cord decompression. Pain well controlled at this time. Given concern for continued constipation and importance for patient to be alert and awake enough for physical therapy, slowly weaning down pain  medications. Will continue to assess daily. - Neurosurgery and ID following, appreciate recs - C/w daptomycin, ceftriaxone, rifampin (end date: 10/30/20) - Decreasing oxycodone to 7.5mg  q8h - C/w oxycontin 20mg  bid, IV dilaudid 1-2mg  q4 PRN - C/w home robaxin, gabapentin  #Opioid-induced constipation Patient mentions he has had a few small bowel movements overnight after initiating lactulose. His abdominal pressure has improved slightly since he started having BM. On exam, abdomen soft, non-distended, tenderness to palpation in LLQ. Will continue with current regimen and assess daily. - C/w Senokot-S, Miralax, lactulose BID - C/w Movantik 25mg  qd  DIET: Regular IVF: n/a DVT PPX: Lovenox BOWEL: Senokot-S, Miralax, Lactulose, Movantik CODE: FULL FAM COM: n/a  Prior to Admission Living Arrangement: Home Anticipated Discharge Location: SNF Barriers to Discharge: medical management Dispo: Anticipated discharge in approximately >5 day(s).   [name], MD 09/14/2020, 6:55 AM Pager: [phone] After 5pm on weekdays and 1pm on weekends: On Call pager [phone]

## 2020-09-15 ENCOUNTER — Inpatient Hospital Stay (HOSPITAL_COMMUNITY): Payer: Medicare Other

## 2020-09-15 ENCOUNTER — Encounter (HOSPITAL_COMMUNITY): Payer: Self-pay | Admitting: Student in an Organized Health Care Education/Training Program

## 2020-09-15 DIAGNOSIS — K5903 Drug induced constipation: Secondary | ICD-10-CM

## 2020-09-15 DIAGNOSIS — T402X5A Adverse effect of other opioids, initial encounter: Secondary | ICD-10-CM

## 2020-09-15 DIAGNOSIS — R140 Abdominal distension (gaseous): Secondary | ICD-10-CM

## 2020-09-15 DIAGNOSIS — R1013 Epigastric pain: Secondary | ICD-10-CM

## 2020-09-15 LAB — GLUCOSE, CAPILLARY: Glucose-Capillary: 107 mg/dL — ABNORMAL HIGH (ref 70–99)

## 2020-09-15 IMAGING — CR DG ABDOMEN 1V
2 series · 2 of 2 positions shown · non-contrast
Comparison: [DATE]

CLINICAL DATA: Abdominal distension

EXAM:
ABDOMEN - 1 VIEW

[abdomen kub (1 of 2)]
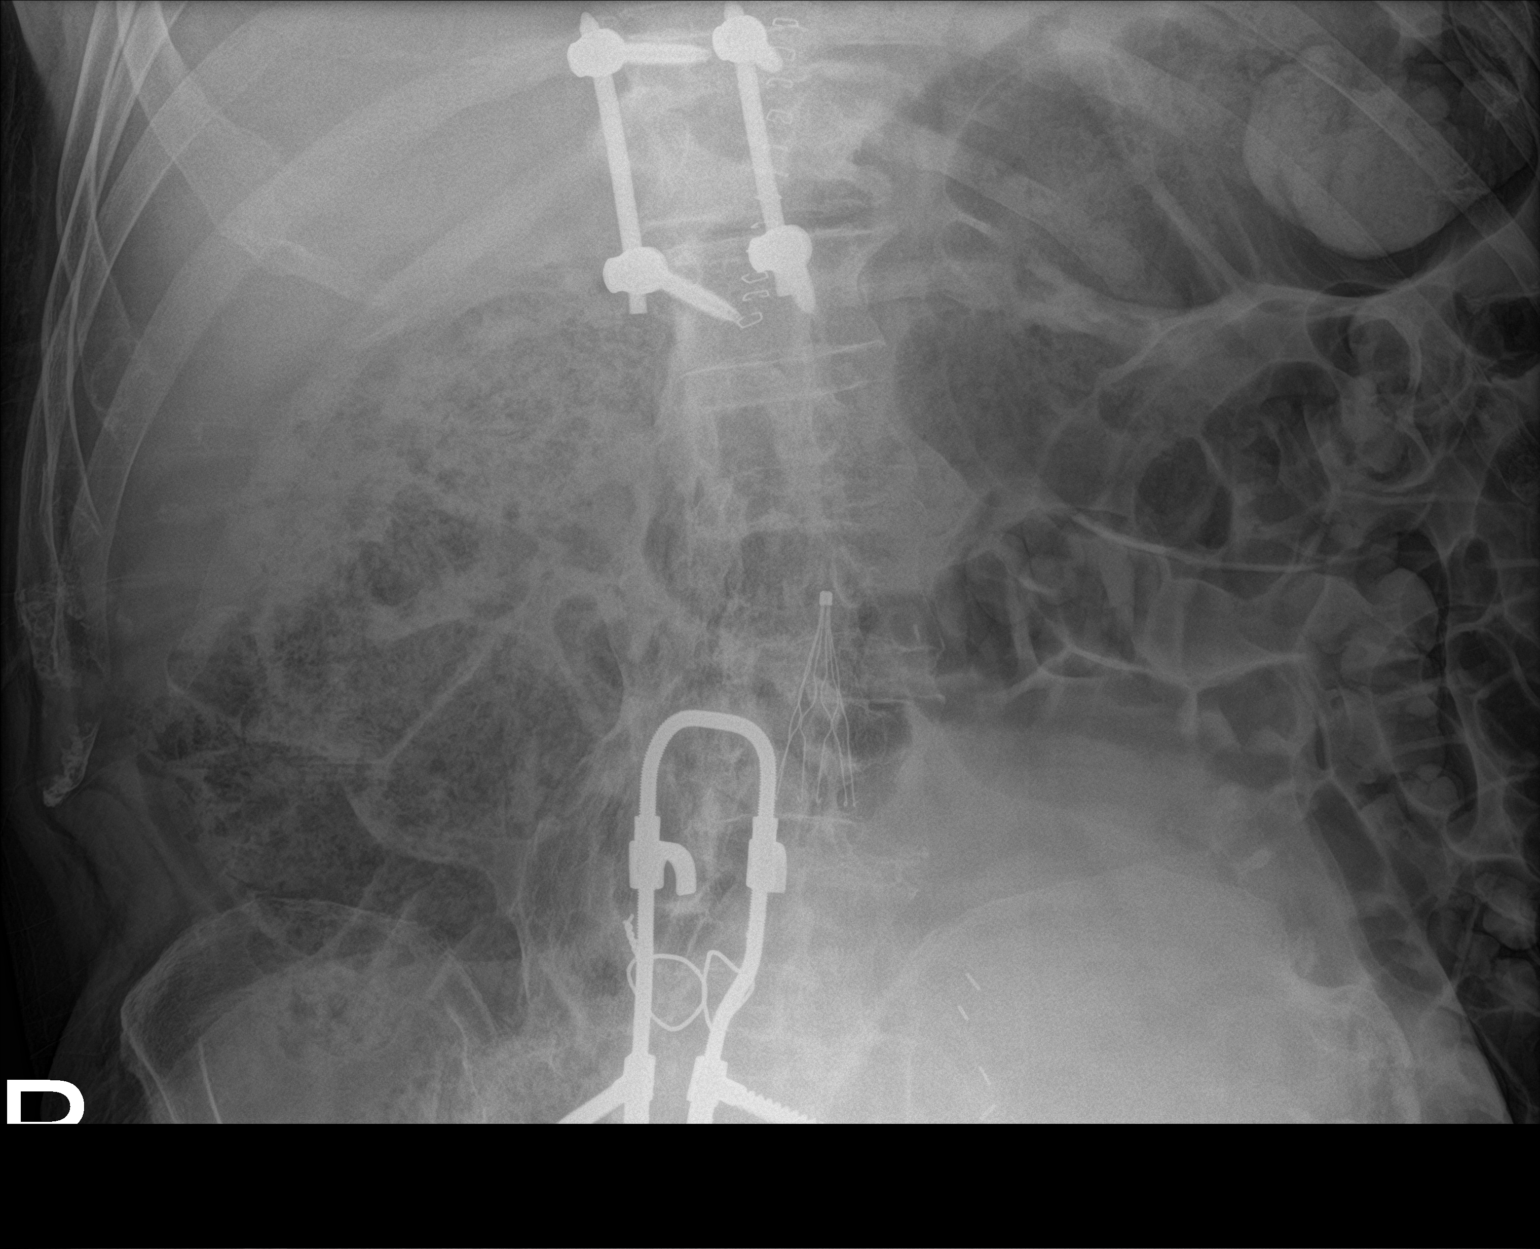

[abdomen kub (2 of 2)]
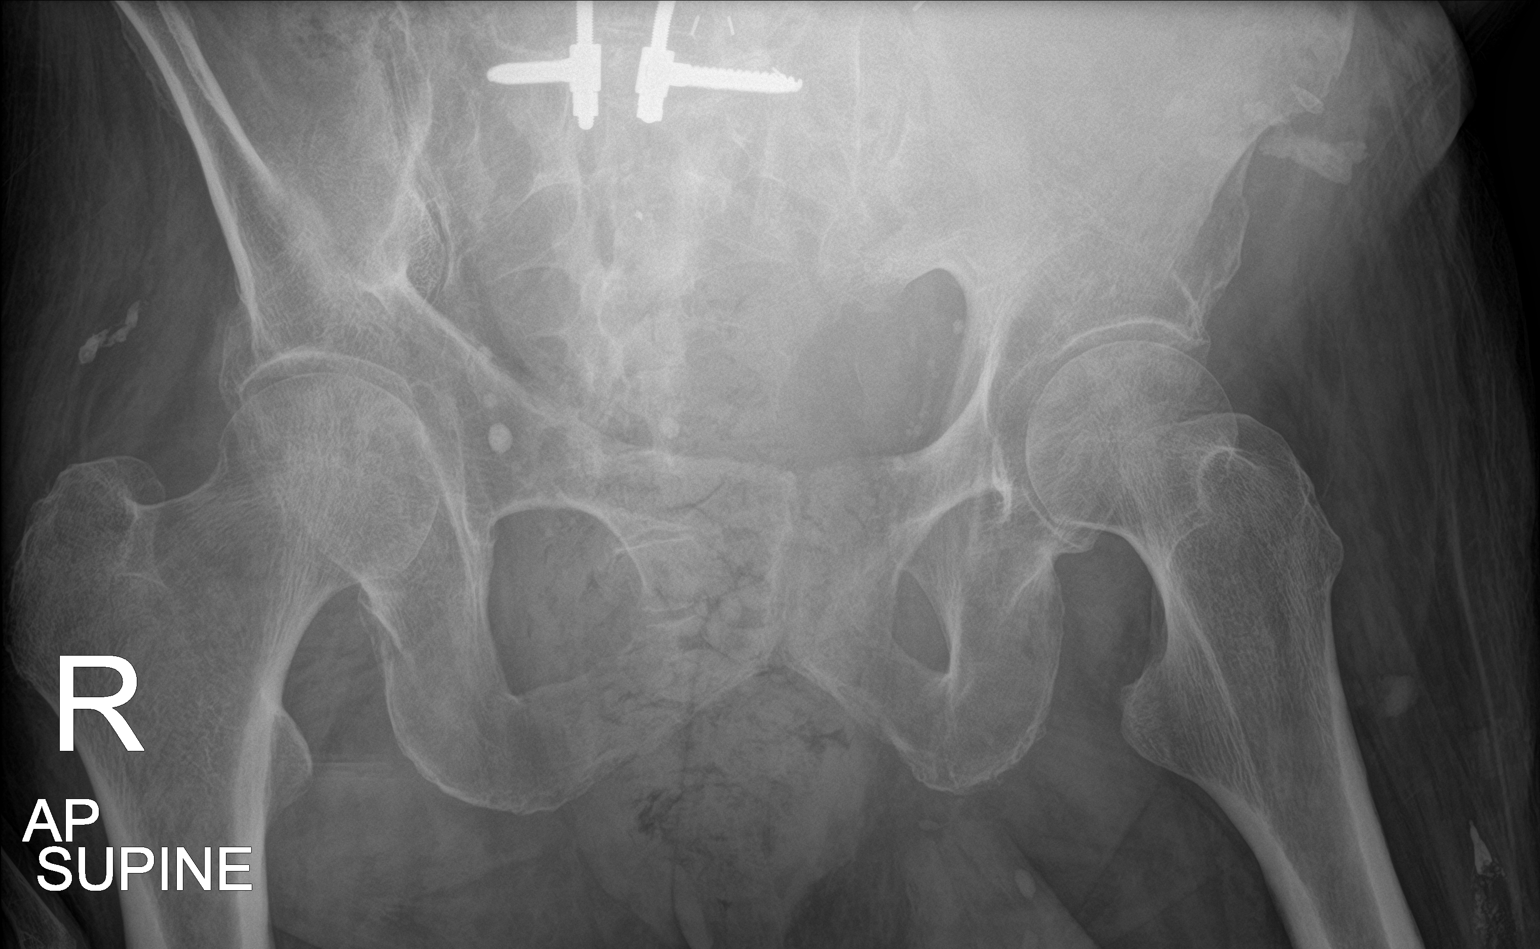

[2 of 2 positions shown; findings below may reference images not displayed]

FINDINGS: There is stool throughout much of the colon. There is air throughout
bowel without overt bowel dilatation. No air-fluid levels. No free
air evident on supine examination. Postoperative changes noted in
the lower thoracic and lumbar regions. Inferior vena cava filter
noted. Clips overlying left pelvis noted.
IMPRESSION: Stool throughout much of colon. No bowel obstruction or free air
appreciable. Postoperative changes noted.

## 2020-09-15 MED ORDER — SORBITOL 70 % SOLN
960.0000 mL | TOPICAL_OIL | Freq: Once | ORAL | Status: AC
  Administered 2020-09-15: 960 mL via RECTAL
  Filled 2020-09-15: qty 473

## 2020-09-15 MED ORDER — METOPROLOL TARTRATE 50 MG PO TABS
75.0000 mg | ORAL_TABLET | Freq: Two times a day (BID) | ORAL | Status: DC
  Administered 2020-09-15 – 2020-09-16 (×4): 75 mg via ORAL
  Filled 2020-09-15 (×4): qty 1

## 2020-09-15 NOTE — Progress Notes (Signed)
  Date: 09/15/2020  Patient name: Thomas [name] record number: [id number]  Date of birth: Mar 13, 1951   I have seen and evaluated this patient and I have discussed the plan of care with the house staff. Please see their note for complete details. I concur with their findings.  Lenice Pressman, M.D., Ph.D. 09/15/2020, 2:26 PM

## 2020-09-15 NOTE — Progress Notes (Signed)
Enema given per order.Large amounts of stool resulted.

## 2020-09-15 NOTE — Plan of Care (Signed)
  Problem: Education: Goal: Knowledge of General Education information will improve Description Including pain rating scale, medication(s)/side effects and non-pharmacologic comfort measures Outcome: Progressing   Problem: Health Behavior/Discharge Planning: Goal: Ability to manage health-related needs will improve Outcome: Progressing   

## 2020-09-15 NOTE — Progress Notes (Signed)
   Subjective:  Patient notes that he is currently in pain with back pain, epigastric pain and endorses that this is now extending to his pelvis. Patient notes his bowels are moving but no real bowel movement yet at this time.   Objective:  Vital signs in last 24 hours: Vitals:   09/14/20 1416 09/14/20 1956 09/15/20 0414 09/15/20 0500  BP: (!) 154/106 (!) 156/100 (!) 155/98   Pulse: 84 100 90   Resp: 17 18 16    Temp: 98.3 F (36.8 C) 98.6 F (37 C) 98.8 F (37.1 C)   TempSrc: Oral Oral Oral   SpO2: 92% 96% 94%   Weight:    98.3 kg  Height:       Physical Exam: General: Elderly male, laying in bed, no acute distress Abdomen: Distended, soft, tender to palpation lower quadrants. Tympanic on percussion. Neuro: Awake, alert, oriented x4. Moving extremities appropriately.  Assessment/Plan: Mr. Nanney is 68yo male (he/him) with chronic back pain s/p trauma and multiple spinal injuries/surgeries, chronic osteomyelitis T10-T12, prior CVA, CKD IV, type 2 diabetes mellitus, gout, R BKA admitted 11/11 for acute on chronic back pain following recent injury at OSH, POD11 T10-T12 fusion and spinal cord decompression, now with worsening abdominal pressure in setting of high opioid dependence and constipation.  Principal Problem:   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 [facility]) Active Problems:   [name], right ([place])   [name], type 2) ([place])   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[facility])   CKD (chronic kidney disease), stage IV ([facility])   Physical deconditioning   Acute midline low back pain with sciatica  #Acute on chronic back pain 2/2 injury #Chronic discitis, osteomyelitis T10-T12 #HX MRSA epidural abscess POD11 T10-T12 fusion and spinal cord decompression. Patient complaining of pain this morning, but with worsening abdominal pressure and constipation. Will continue with current pain medication and antibiotics. PICC in place. - Neurosurgery and ID following,  appreciate recs - C/w daptomycin, ceftriaxone, rifampin (end date 10/30/20) - Pain regimen: Oxycontin 20mg  BID, oxycodone 7.5mg  q8, IV dilaudid 1-2mg  q4 PRN - C/w home robaxin, gabapentin  #Opioid-induced constipation #Distended abdomen Patient reports worsening abdominal pain and pressure this morning. On exam, distended, tympanic, tender on lower quadrants. KUB this AM w/ large stool burden, no signs of bowel obstruction or free air. Will continue with bowel regimen and  add SMOG enema. - C/w Senokot-S, Miralax, lactulose BID - C/w Movantik 25mg  qd - SMOG enema today  DIET: Regular IVF: n/a DVT PPX: Lovenox BOWEL: Senokot-S, Miralax, lactulose, Movantik, SMOG enema CODE: FULL FAM COM: n/a  Prior to Admission Living Arrangement: Home Anticipated Discharge Location: SNF Barriers to Discharge: medical management Dispo: Anticipated discharge in approximately 4-5 day(s).   [name], MD 09/15/2020, 6:24 AM Pager: [phone] After 5pm on weekdays and 1pm on weekends: On Call pager [phone]

## 2020-09-15 NOTE — Plan of Care (Signed)
No acute changes since the previous night that I took care of him. Will continue to monitor and continue current POC. 

## 2020-09-16 LAB — GLUCOSE, CAPILLARY: Glucose-Capillary: 111 mg/dL — ABNORMAL HIGH (ref 70–99)

## 2020-09-16 NOTE — Plan of Care (Signed)

## 2020-09-16 NOTE — Progress Notes (Signed)
Subjective: [name] Overnight, patient had large volume bowel movement following enema.   Thomas [name] was evaluated at bedside this morning. He is resting comfortably in bed and is sleeping on initial evaluation. On awakening, patient is endorsing mild back pain. He notes that his abdominal discomfort is significantly improved following his bowel movement. He endorses feeling weak as he slept through his breakfast.   Objective:  Vital signs in last 24 hours: Vitals:   09/15/20 1428 09/15/20 2044 09/16/20 0450 09/16/20 0500  BP: (!) 164/105 (!) 156/99 (!) 153/86   Pulse: 90 81 81   Resp: 18 19 18    Temp: 98.4 F (36.9 C) 97.7 F (36.5 C) 98.2 F (36.8 C)   TempSrc: Oral Oral Oral   SpO2: 98% 97% 97%   Weight:    94.4 kg  Height:       CBC Latest Ref Rng & Units 09/09/2020 09/06/2020 09/05/2020  WBC 4.0 - 10.5 K/uL 6.9 10.1 9.7  Hemoglobin 13.0 - 17.0 g/dL 9.1(L) 8.8(L) 9.5(L)  Hematocrit 39 - 52 % 29.3(L) 27.6(L) 29.9(L)  Platelets 150 - 400 K/uL 270 251 313   BMP Latest Ref Rng & Units 09/09/2020 09/06/2020 09/05/2020  Glucose 70 - 99 mg/dL 105(H) 118(H) 119(H)  BUN 8 - 23 mg/dL 25(H) 29(H) 32(H)  Creatinine 0.61 - 1.24 mg/dL 1.78(H) 2.17(H) 2.12(H)  BUN/Creat Ratio 6 - 22 (calc) - - -  Sodium 135 - 145 mmol/L 137 138 135  Potassium 3.5 - 5.1 mmol/L 4.4 5.1 4.5  Chloride 98 - 111 mmol/L 103 107 105  CO2 22 - 32 mmol/L 23 25 20(L)  Calcium 8.9 - 10.3 mg/dL 9.0 8.9 9.0   Physical Exam  Constitutional: Appears well-developed and well-nourished elderly male. No distress.  GI: Nondistended, soft, epigastric and lower abdomen tenderness to palpation, bowel sounds present  Musculoskeletal: Normal bulk and tone.  No peripheral edema noted.  Assessment/Plan:  Principal Problem:   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 [facility]) Active Problems:   [name], right ([place])   [name], type 2) ([place])   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[place])   CKD  (chronic kidney disease), stage IV (HCC)   Physical deconditioning   Acute midline low back pain with sciatica  Thomas Thomas [name] is a 69 year old male with PMHx of chronic back pain s/p trauma and multiple spinal injuries/surgeries, chronic osteomyelitis of T10-T12, prior CVA, CKDIV, type II DM, gout, right BKA admitted on 11/11 for acute on chronic back pain following injury at OSH, POD12 T10-T12 fusion and spinal cord decompression complicated by opioid induced constipation  Acute on chronic back pain 2/2 injury Chronic discitis, osteomyelitis T10-T12 Hx of MRSA epidural abscess POD 12 T10-T12 fusion and spinal cord decompression. Patient endorses mild ongoing pain but overall tolerable. He has not worked with physical therapy yet this morning but is willing to do so. Attempting to wean pain regimen as tolerated.  - Neurosurgery and ID following, appreciate recs - C/w daptomycin, ceftriaxone, rifampin (end date 10/30/20) - Pain regimen: Oxycontin 20mg  BID, oxycodone 7.5mg  q8, IV dilaudid 1-2mg  q4 PRN - C/w home robaxin, gabapentin  Opioid induced constipation Patient had large bowel movement with SMOG enema yesterday. He notes improvement in his abdominal pain this morning although still has some tenderness to palpation.  - Continue aggressive bowel regimen at this time - Continue Movantik 25mg  daily  Diet: Regular Fluids: None DVT Prophylaxis: Lovenox Code Status: FULL   Prior  to Admission Living Arrangement: Home  Anticipated Discharge Location: SNF Barriers to Discharge: Continued medical management  Dispo: Anticipated discharge in approximately >5 day(s).   Harvie Heck, MD  [facility] PGY-2 09/16/2020, 7:06 AM Pager: [phone] After 5pm on weekdays and 1pm on weekends: On Call pager [phone]

## 2020-09-16 NOTE — Plan of Care (Signed)
  Problem: Education: Goal: Knowledge of General Education information will improve Description: Including pain rating scale, medication(s)/side effects and non-pharmacologic comfort measures Outcome: Progressing   Problem: Nutrition: Goal: Adequate nutrition will be maintained Outcome: Progressing   

## 2020-09-17 LAB — BASIC METABOLIC PANEL
Anion gap: 9 (ref 5–15)
BUN: 30 mg/dL — ABNORMAL HIGH (ref 8–23)
CO2: 24 mmol/L (ref 22–32)
Calcium: 8.8 mg/dL — ABNORMAL LOW (ref 8.9–10.3)
Chloride: 106 mmol/L (ref 98–111)
Creatinine, Ser: 1.91 mg/dL — ABNORMAL HIGH (ref 0.61–1.24)
GFR, Estimated: 38 mL/min — ABNORMAL LOW (ref >60)
Glucose, Bld: 94 mg/dL (ref 70–99)
Potassium: 5.2 mmol/L — ABNORMAL HIGH (ref 3.5–5.1)
Sodium: 139 mmol/L (ref 135–145)

## 2020-09-17 LAB — CBC
HCT: 29.7 % — ABNORMAL LOW (ref 39.0–52.0)
Hemoglobin: 9.4 g/dL — ABNORMAL LOW (ref 13.0–17.0)
MCH: 30.9 pg (ref 26.0–34.0)
MCHC: 31.6 g/dL (ref 30.0–36.0)
MCV: 97.7 fL (ref 80.0–100.0)
Platelets: 277 10*3/uL (ref 150–400)
RBC: 3.04 MIL/uL — ABNORMAL LOW (ref 4.22–5.81)
RDW: 15.7 % — ABNORMAL HIGH (ref 11.5–15.5)
WBC: 9.4 10*3/uL (ref 4.0–10.5)
nRBC: 0 % (ref 0.0–0.2)

## 2020-09-17 LAB — CK: Total CK: 48 U/L — ABNORMAL LOW (ref 49–397)

## 2020-09-17 LAB — HEPATIC FUNCTION PANEL
ALT: 12 U/L (ref 0–44)
AST: 20 U/L (ref 15–41)
Albumin: 2.8 g/dL — ABNORMAL LOW (ref 3.5–5.0)
Alkaline Phosphatase: 57 U/L (ref 38–126)
Bilirubin, Direct: <0.1 mg/dL (ref 0.0–0.2)
Total Bilirubin: 0.7 mg/dL (ref 0.3–1.2)
Total Protein: 7.4 g/dL (ref 6.5–8.1)

## 2020-09-17 LAB — GLUCOSE, CAPILLARY
Glucose-Capillary: 112 mg/dL — ABNORMAL HIGH (ref 70–99)
Glucose-Capillary: 106 mg/dL — ABNORMAL HIGH (ref 70–99)

## 2020-09-17 LAB — MAGNESIUM: Magnesium: 1.8 mg/dL (ref 1.7–2.4)

## 2020-09-17 LAB — PHOSPHORUS: Phosphorus: 4.2 mg/dL (ref 2.5–4.6)

## 2020-09-17 MED ORDER — METOPROLOL TARTRATE 50 MG PO TABS
100.0000 mg | ORAL_TABLET | Freq: Two times a day (BID) | ORAL | Status: DC
  Administered 2020-09-17 – 2020-10-03 (×33): 100 mg via ORAL
  Filled 2020-09-17 (×33): qty 2

## 2020-09-17 MED ORDER — SODIUM ZIRCONIUM CYCLOSILICATE 5 G PO PACK
5.0000 g | PACK | Freq: Every day | ORAL | Status: DC
  Administered 2020-09-17 – 2020-09-18 (×2): 5 g via ORAL
  Filled 2020-09-17 (×2): qty 1

## 2020-09-17 MED ORDER — OXYCODONE HCL 5 MG PO TABS
5.0000 mg | ORAL_TABLET | Freq: Three times a day (TID) | ORAL | Status: DC
  Administered 2020-09-17 – 2020-09-22 (×15): 5 mg via ORAL
  Filled 2020-09-17 (×15): qty 1

## 2020-09-17 NOTE — Progress Notes (Signed)
Physical Therapy Treatment Patient Details Name: Thomas [name] MRN: [id number] DOB: January 10, 1951 Today's Date: 09/17/2020    History of Present Illness 69 year old person living with a right below-knee amputation, chronic pain syndrome due to a complicated history of thoracic spine infections and degenerative disease, deconditioning after recent Covid infection and comorbidities admitted to our service for medical managements of another episode of chronic thoracic osteomyelitis and discitis with degenerative changes causing spinal cord compression. On 11/16, pt underwent surgical decompression around T11, fusion, and disc aspiration for microbiology    PT Comments    Pt making good progress towards mobility goals, able to perform lateral scoot transfer to/from EOB<>drop arm recliner this session. Once up in chair, pt reporting severe back pain, able to tolerate sitting up in chair ~5 mins prior to needing to return to bed. Pt performed bed mobility with Supervision, compliant with log rolling/back precautions, needs total A to don TLSO brace and min guard assist for lateral scoot to/from chair. Pt also performed supine/seated BLE AROM therapeutic exercises with good tolerance as detailed below. Pt continues to benefit from skilled rehab in a post acute setting to maximize functional gains before returning home.  Follow Up Recommendations  SNF     Equipment Recommendations  None recommended by PT (defer to next facility)    Recommendations for Other Services       Precautions / Restrictions Precautions Precautions: Fall;Back Precaution Booklet Issued: Yes (comment) Precaution Comments: reviewed back precs, handout given Required Braces or Orthoses: Spinal Brace Spinal Brace: Thoracolumbosacral orthotic Spinal Brace Comments: TLSO when OOB Restrictions Weight Bearing Restrictions: No    Mobility  Bed Mobility Overal bed mobility: Needs Assistance Bed Mobility: Rolling;Sidelying to  Sit;Sit to Sidelying Rolling: Modified independent (Device/Increase time) Sidelying to sit: Supervision     Sit to sidelying: Supervision General bed mobility comments: use of bed rails, pt able to perform log roll without physical assist with improved technique this session, heavy use of bed rails  Transfers Overall transfer level: Needs assistance   Transfers: Lateral/Scoot Transfers          Lateral/Scoot Transfers: Min guard General transfer comment: pt performed lateral scoot transfer from EOB<>drop arm recliner, able to remain up in chair ~5 minutes while bed linens changed, but reports severe back pain while sitting up in chair; pt refused to remain up in chair and assisted back to bed prior to end of session  Ambulation/Gait                 Stairs             Wheelchair Mobility    Modified Rankin (Stroke Patients Only)       Balance Overall balance assessment: Needs assistance Sitting-balance support: No upper extremity supported;Feet supported Sitting balance-[name]: Fair Sitting balance - Comments: improved balance, pt able to perform static sitting and weight shifting no LOB                                    Cognition Arousal/Alertness: Awake/alert Behavior During Therapy: WFL for tasks assessed/performed Overall Cognitive Status: No family/caregiver present to determine baseline cognitive functioning Area of Impairment: Problem solving;Safety/judgement;Following commands;Memory                 Orientation Level: Disoriented to;Time   Memory: Decreased short-term memory;Decreased recall of precautions Following Commands: Follows one step commands with increased time Safety/Judgement: Decreased awareness  of safety;Decreased awareness of deficits   Problem Solving: Requires verbal cues General Comments: pt more compliant with back precautions, able to log roll, needs encouragement and increased time to attempt  mobility tasks; given back precs handout in HEP packet & re-reviewed      Exercises General Exercises - Lower Extremity Ankle Circles/Pumps: AROM;Left;10 reps;Supine Gluteal Sets: AROM;Strengthening;Both;5 reps;Supine Long Arc Quad: AROM;Strengthening;Both;10 reps;Seated Heel Slides: AROM;Left;Supine;15 reps Hip ABduction/ADduction: AROM;Strengthening;Both;15 reps;Supine    General Comments General comments (skin integrity, edema, etc.): pt reporting some dizziness due to pain once up in chair but UTA for safety no BP monitor in room; VSS per chart      Pertinent Vitals/Pain Pain Assessment: Faces Faces Pain Scale: Hurts whole lot Pain Location: back, worst when sitting up in chair Pain Descriptors / Indicators: Aching;Sore;Discomfort;Grimacing;Constant Pain Intervention(s): Monitored during session;RN gave pain meds during session;Repositioned   Vitals:   09/17/20 0904  BP: (!) 158/93  Pulse: 76  Resp: 18  Temp: 98 F (36.7 C)  SpO2: 98%    Home Living                      Prior Function            PT Goals (current goals can now be found in the care plan section) Acute Rehab PT Goals Patient Stated Goal: pain control, get stronger and get home PT Goal Formulation: With patient Time For Goal Achievement: 09/18/20 Potential to Achieve Goals: Fair Progress towards PT goals: Progressing toward goals    Frequency    Min 4X/week      PT Plan Current plan remains appropriate    Co-evaluation              AM-PAC PT "6 Clicks" Mobility   Outcome Measure  Help needed turning from your back to your side while in a flat bed without using bedrails?: None Help needed moving from lying on your back to sitting on the side of a flat bed without using bedrails?: None Help needed moving to and from a bed to a chair (including a wheelchair)?: A Little (lateral scoot) Help needed standing up from a chair using your arms (e.g., wheelchair or bedside chair)?:  Total Help needed to walk in hospital room?: Total Help needed climbing 3-5 steps with a railing? : Total 6 Click Score: 14    End of Session Equipment Utilized During Treatment: Back brace Activity Tolerance: Patient tolerated treatment well Patient left: in bed;with call bell/phone within reach;with bed alarm set (RN notified pt request pain meds (pt not yet due)) Nurse Communication: Mobility status;Patient requests pain meds PT Visit Diagnosis: Unsteadiness on feet (R26.81);Muscle weakness (generalized) (M62.81)     Time: 1025-8527 PT Time Calculation (min) (ACUTE ONLY): 43 min  Charges:  $Therapeutic Exercise: 8-22 mins $Therapeutic Activity: 8-22 mins                     Bethsaida Siegenthaler P., PTA Acute Rehabilitation Services Pager: [phone] Office: [place] 09/17/2020, 2:28 PM

## 2020-09-17 NOTE — TOC Progression Note (Signed)
Transition of Care [facility]) - Progression Note    Patient Details  Name: NATNAEL BIEDERMAN MRN: [id number] Date of Birth: 30-Dec-1950  Transition of Care [facility]) CM/SW [place], RN Phone Number: 09/17/2020, 1:11 PM  Clinical Narrative:    Case management called and spoke with Claiborne Billings, [place] with [facility] facilities and [place] is not accepting patients at this time.  I asked if the facility case manager would check on admission capability of their facility in [facility] and [facility] at this time.  [place], Harris with [facility] will be calling me back concerning offering a bed opening to the patient.  Will continue to follow for discharge to a SNF facility.   Expected Discharge Plan: [place] Barriers to Discharge: Continued Medical Work up  Expected Discharge Plan and Services Expected Discharge Plan: [place]   Discharge Planning Services: CM Consult Post Acute Care Choice: [place] Living arrangements for the past 2 months: [place]                                       Social Determinants of Health (SDOH) Interventions    Readmission Risk Interventions Readmission Risk Prevention Plan 09/17/2019  Transportation Screening Complete  PCP or Specialist Appt within 5-7 Days Not Complete  Not Complete comments snf vs home  Home Care Screening Complete  Medication Review (RN CM) Complete  Some recent data might be hidden

## 2020-09-17 NOTE — Progress Notes (Signed)
   Subjective: HD#18 Patient evaluated at bedside this AM. States he has been trying to get some rest as his recent incision hurts. Mentions his abdominal pressure has improved after large BM.  Reports he missed most of his meals over the weekend due to being asleep. He says he has been working well with physical therapy and has been able to sit on side of bed.  Objective:  Vital signs in last 24 hours: Vitals:   09/16/20 1002 09/16/20 1140 09/16/20 2108 09/17/20 0532  BP: (!) 141/83 140/83 (!) 156/95 (!) 178/90  Pulse:  66 84 68  Resp:  17 18 18   Temp:  98 F (36.7 C) 98.3 F (36.8 C) 97.7 F (36.5 C)  TempSrc:  Oral Oral Oral  SpO2:  96% 98% 98%  Weight:      Height:       Physical Exam: General: Comfortable appearing, no acute distress MSK: L foot with chronic gouty changes.  Neuro: Awake, alert, oriented x4. Moving extremities appropriately.  Assessment/Plan: Mr. Colavito is 69yo male (he/him) with chronic back pain s/p trauma and multiple spinal injuries/surgeries, chronic osteomyelitis of T10-T12, prior CVA, CKD IV, type 2 diabetes mellitus, gout, right BKA admitted on 11/11 for acute on chronic back pain following injury at OSH, POD13 T10-T12 fusion, spinal cord decompression, attempting to wean pain medications in setting of opioid-induced constipation.  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 Continues to endorse pain, but appears to be tolerable. He has been able to work with physical therapy and had large BM over the weekend. Will continue  with current regimen, plan to decrease oxycodone tomorrow. - Neurosurgery and ID following, appreciate recs - C/w daptomycin, ceftriaxone, rifampin ( end date 10/30/20) - PICC in place - Pain regimen: Oxycontin 20mg  BID, oxycodone 7.5mg  q8h, IV dilaudid 1-2mg  q4 PRN - C/w home robaxin, gabapentin  #Opioid-induced constipation Large bowel movement over the weekend s/p SMOG enema. Reports mild abdominal tenderness today, bu much improved from Saturday. Will continue with regimen and monitor daily. - C/w Senokot-S, Miralax, lactulose BID - C/w Movantik 25mg  qd - SMOG enema today  DIET: Regular IVF: n/a DVT PPX: Lovenox BOWEL: Senokot-S, Miralax, lactulose, Movantik CODE: FULL FAM COM: n/a  Prior to Admission Living Arrangement: Home Anticipated Discharge Location: SNF Barriers to Discharge: medical management Dispo: Anticipated discharge in approximately >5 day(s).   [name], MD 09/17/2020, 7:10 AM Pager: [phone] After 5pm on weekdays and 1pm on weekends: On Call pager [phone]

## 2020-09-18 LAB — BASIC METABOLIC PANEL
Anion gap: 10 (ref 5–15)
BUN: 22 mg/dL (ref 8–23)
CO2: 22 mmol/L (ref 22–32)
Calcium: 8.6 mg/dL — ABNORMAL LOW (ref 8.9–10.3)
Chloride: 105 mmol/L (ref 98–111)
Creatinine, Ser: 1.52 mg/dL — ABNORMAL HIGH (ref 0.61–1.24)
GFR, Estimated: 50 mL/min — ABNORMAL LOW (ref >60)
Glucose, Bld: 93 mg/dL (ref 70–99)
Potassium: 4.3 mmol/L (ref 3.5–5.1)
Sodium: 137 mmol/L (ref 135–145)

## 2020-09-18 LAB — GLUCOSE, CAPILLARY: Glucose-Capillary: 91 mg/dL (ref 70–99)

## 2020-09-18 MED ORDER — HYDROMORPHONE HCL 1 MG/ML IJ SOLN
1.0000 mg | Freq: Three times a day (TID) | INTRAMUSCULAR | Status: DC | PRN
  Administered 2020-09-18 – 2020-09-19 (×4): 1 mg via INTRAVENOUS
  Filled 2020-09-18 (×4): qty 1

## 2020-09-18 MED ORDER — HYDRALAZINE HCL 50 MG PO TABS: 50.0000 mg | ORAL_TABLET | Freq: Three times a day (TID) | ORAL | Status: DC

## 2020-09-18 MED ORDER — HYDRALAZINE HCL 25 MG PO TABS
25.0000 mg | ORAL_TABLET | Freq: Three times a day (TID) | ORAL | Status: DC
  Administered 2020-09-18 (×3): 25 mg via ORAL
  Filled 2020-09-18 (×3): qty 1

## 2020-09-18 MED ORDER — LACTULOSE 10 GM/15ML PO SOLN
10.0000 g | Freq: Two times a day (BID) | ORAL | Status: DC
  Administered 2020-09-18 – 2020-10-03 (×31): 10 g via ORAL
  Filled 2020-09-18 (×31): qty 15

## 2020-09-18 NOTE — Progress Notes (Signed)
Subjective: HD#19 Thomas [name] evaluated at bedside this AM. States feels better this morning as his staples have been removed. Notes he has some left eye irritation, no subjective visual defects. Endorses improved abdomen pressure after BM yesterday. Worked with PT well yesterday.   Objective:  Vital signs in last 24 hours: Vitals:   09/17/20 1605 09/17/20 1944 09/18/20 0338 09/18/20 0500  BP: (!) 167/92 (!) 151/81 (!) 169/85   Pulse: 67 69 68   Resp: 18 16 15    Temp: 97.9 F (36.6 C) 98.4 F (36.9 C) 98.6 F (37 C)   TempSrc: Oral Oral Oral   SpO2: 100% 96% 96%   Weight:    94.4 kg  Height:       Physical Exam: General: Pleasant, no acute distress EYES: No scleral injection. Some mild irritation on lower eyelid. No purulence. MSK: Chronic gout changes L foot. Skin in tact on L heel, foot. Neuro: Awake, alert, oriented x4. Moving extremities appropriately   CBC Latest Ref Rng & Units 09/17/2020 09/09/2020 09/06/2020  WBC 4.0 - 10.5 K/uL 9.4 6.9 10.1  Hemoglobin 13.0 - 17.0 g/dL 9.4(L) 9.1(L) 8.8(L)  Hematocrit 39 - 52 % 29.7(L) 29.3(L) 27.6(L)  Platelets 150 - 400 K/uL 277 270 251   BMP Latest Ref Rng & Units 09/18/2020 09/17/2020 09/09/2020  Glucose 70 - 99 mg/dL 93 94 105(H)  BUN 8 - 23 mg/dL 22 30(H) 25(H)  Creatinine 0.61 - 1.24 mg/dL 1.52(H) 1.91(H) 1.78(H)  BUN/Creat Ratio 6 - 22 (calc) - - -  Sodium 135 - 145 mmol/L 137 139 137  Potassium 3.5 - 5.1 mmol/L 4.3 5.2(H) 4.4  Chloride 98 - 111 mmol/L 105 106 103  CO2 22 - 32 mmol/L 22 24 23   Calcium 8.9 - 10.3 mg/dL 8.6(L) 8.8(L) 9.0   Assessment/Plan: Thomas [name] is 69yo male (he/him) with chronic back pain s/p trauma and multiple spinal injuries/surgeries, chronic osteomyelitis of T10-T12, prior CVA, CKD IV, type 2 diabetes mellitus, gout, right BKA admitted on 11/11 for acute on chronic back following injury at OSH, POD14 T10-T12 fusion, spinal cord decompression, continuing to wean down opioid  medications.  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 Pain well-controlled, continues to work with physical therapy. Dr. Reatha Armour with neurosurgery removed staples this AM. Hope to remove dilaudid soon given patient is now 14 days post-op. - Appreciate neurosurgery, ID recs - C/w daptomycin, ceftriaxone, rifampin (end date 10/30/20) - PICC in place - Current pain regimen: oxycontin 20mg  BID, oxycodone 5mg  q8h, IV dilaudid 1-2mg  q8h PRN - C/w home robaxin, gabapentin  #Opioid-induced constipation Patient was able to have BM yesterday. Patient reports improved abdominal pain from yesterday. He has been tolerating food well. Plan to continue with current regimen, SMOG enema PRN. - C/w Senokot-S, Miralax, lactulose BID - C/w Movantik 25mg  qd  #Hypertension Given patient's SBP had consistently been elevated in 140-150's, increased his home metoprolol to 100mg  bid yesterday. Overnight, continues to have elevated systolic pressures. Will add on his home hydralazine at half dose, can up-titrate as needed. Avoiding ACE/ARB given  renal function. - C/w amlodipine 10mg  qd, lopressor 100mg  BID  - Add hydralazine 25mg  TID  #Hyperkalemia, resolved K+ 4.3 <  5.2 s/p lokelma. Will intermittently monitor electrolytes given he is overall stable. - Trend BMP 1-2/wk  #L eye irritation Patient without visual symptoms. Appears to have mild irritation on lower lid.  - Warm compress PRN  DIET: Regular IVF: n/a DVT PPX: Lovenox BOWEL: Senokot-S, Miralax, lactulose,  Movantik CODE: FULL FAM COM: n/a  Prior to Admission Living Arrangement: Home Anticipated Discharge Location: SNF Barriers to Discharge: SNF placement Dispo: Anticipated discharge in approximately >5 day(s).   Thomas Dame, MD 09/18/2020, 6:14 AM Pager: [phone] After 5pm on weekdays and 1pm on weekends: On Call pager [phone]

## 2020-09-18 NOTE — Progress Notes (Signed)
   Providing Compassionate, Quality Care - Together  NEUROSURGERY PROGRESS NOTE   S: No issues overnight. Finally had BM, pain is controlled and improving  O: EXAM:  BP (!) 163/102 (BP Location: Left Arm)   Pulse 64   Temp (!) 97.5 F (36.4 C) (Oral)   Resp 18   Ht 6\' 7"  (2.007 m)   Wt 94.4 kg   SpO2 100%   BMI 23.45 kg/m   Awake, alert, oriented Communicating approp Bilateral upper extremities full strength Right lower extremity, BKA, strength 4/5 HF/KE/KF Left lower extremity 4/5 throughout Sensory intact light touch Incision clean dry intact, will remove staples   ASSESSMENT: 69 y.o.malewith   1.T10/11 cord compression with cord signal change -Due to previous discitis/epidural abscess with erosion of T11 vertebral body and likely instability at this level  -Status post T10-12 open decompression and fusion on 09/04/2020  There was clear osteodiscitis at T10-11, Intra-Op pathology/microbiology was sent, no frank abscess noted  PLAN: -PT/OT -TLSO when out of bed -Pain control -staples removed today, leave wound undressed and clean/dry, healing well -DVT prophylaxis (subcu heparin) -SNF pending -overall stable    Thank you for allowing me to participate in this patient's care.  Please do not hesitate to call with questions or concerns.   Elwin Sleight, [facility] Cell: [phone]

## 2020-09-18 NOTE — Progress Notes (Signed)
Physical Therapy Treatment Patient Details Name: Thomas [name] MRN: [id number] DOB: 09-25-1951 Today's Date: 09/18/2020    History of Present Illness 69 year old person living with a right below-knee amputation, chronic pain syndrome due to a complicated history of thoracic spine infections and degenerative disease, deconditioning after recent Covid infection and comorbidities admitted to our service for medical managements of another episode of chronic thoracic osteomyelitis and discitis with degenerative changes causing spinal cord compression. On 11/16, pt underwent surgical decompression around T11, fusion, and disc aspiration for microbiology    PT Comments    Patient received in bed, reports sharp pain in mid back. Asking if he could have done something to it with OT this morning. But all he did was sit up on side of bed. Incision dry and intact. Patient declines sitting up on side of bed, but he will do bed exercises. He is independent with cues for bed exercises and rolling in bed. Impulsive with movements due to sharp pains on occasion. He will continue to benefit from skilled PT while here to improve functional independence and safety with mobility.         Follow Up Recommendations  SNF     Equipment Recommendations  None recommended by PT    Recommendations for Other Services       Precautions / Restrictions Precautions Precautions: Fall;Back Precaution Booklet Issued: No Required Braces or Orthoses: Spinal Brace Spinal Brace: Thoracolumbosacral orthotic Spinal Brace Comments: TLSO when OOB Restrictions Weight Bearing Restrictions: No Other Position/Activity Restrictions: hx of R BKA, back precautions    Mobility  Bed Mobility Overal bed mobility: Modified Independent Bed Mobility: Rolling Rolling: Modified independent (Device/Increase time)         General bed mobility comments: patient declines sitting up on side of bed due to pain in back, but rolls  independently  Transfers                 General transfer comment: declined  Ambulation/Gait             General Gait Details: unable   Stairs             Wheelchair Mobility    Modified Rankin (Stroke Patients Only)       Balance                                            Cognition Arousal/Alertness: Awake/alert Behavior During Therapy: Impulsive Overall Cognitive Status: No family/caregiver present to determine baseline cognitive functioning Area of Impairment: Problem solving;Safety/judgement;Following commands;Memory                 Orientation Level: Disoriented to;Time   Memory: Decreased short-term memory;Decreased recall of precautions Following Commands: Follows one step commands with increased time Safety/Judgement: Decreased awareness of safety;Decreased awareness of deficits Awareness: Emergent Problem Solving: Requires verbal cues General Comments: Pt unable to recall back precautions, poor STM regarding pain meds receival.      Exercises Other Exercises Other Exercises: B LE exercises: ap, heel slides, hip abd/add, SLR independently x 10 reps    General Comments        Pertinent Vitals/Pain Pain Assessment: Faces Faces Pain Scale: Hurts worst Pain Location: mid back Pain Descriptors / Indicators: Spasm;Sharp;Grimacing;Guarding Pain Intervention(s): Limited activity within patient's tolerance;Monitored during session;Repositioned    Home Living  Prior Function            PT Goals (current goals can now be found in the care plan section) Acute Rehab PT Goals Patient Stated Goal: pain control, get stronger and get home PT Goal Formulation: With patient Time For Goal Achievement: 09/27/20 Potential to Achieve Goals: Fair Progress towards PT goals: Progressing toward goals    Frequency    Min 4X/week      PT Plan Current plan remains appropriate     Co-evaluation              AM-PAC PT "6 Clicks" Mobility   Outcome Measure  Help needed turning from your back to your side while in a flat bed without using bedrails?: None Help needed moving from lying on your back to sitting on the side of a flat bed without using bedrails?: None Help needed moving to and from a bed to a chair (including a wheelchair)?: A Little Help needed standing up from a chair using your arms (e.g., wheelchair or bedside chair)?: Total Help needed to walk in hospital room?: Total Help needed climbing 3-5 steps with a railing? : Total 6 Click Score: 14    End of Session   Activity Tolerance: Patient limited by pain Patient left: in bed;with call bell/phone within reach;with bed alarm set Nurse Communication: Mobility status PT Visit Diagnosis: Muscle weakness (generalized) (M62.81);Other abnormalities of gait and mobility (R26.89);Pain Pain - part of body:  (back)     Time: 1345-1355 PT Time Calculation (min) (ACUTE ONLY): 10 min  Charges:  $Therapeutic Exercise: 8-22 mins                     Amariz Flamenco, PT, GCS 09/18/20,2:25 PM

## 2020-09-18 NOTE — Progress Notes (Signed)
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 On 11/16, pt underwent surgical decompression around T11, fusion, and disc aspiration for microbiology   OT comments  Pt with limited progress towards OT goals (updated accordingly), agreeable to attempt limited activities and hyper-focused on pain medication schedule. Pt recalled 0/3 back precautions today but demo acknowledgement of them when cued by OT during tasks. Pt overall Supervision for bed mobility (with cues for log rolling) and able to demo ability to sit EOB for 5 minutes. Total A for mgmt of TLSO brace. Hoped to attempt sit to stand transfers with [facility] today but pt ultimately declined to attempt today due to pain and diminished sensation in L foot. Continue to recommend SNF at DC.   Follow Up Recommendations  SNF    Equipment Recommendations  None recommended by OT    Recommendations for Other Services      Precautions / Restrictions Precautions Precautions: Fall;Back Precaution Booklet Issued: Yes (comment) Precaution Comments: reviewed back precs, handout given Required Braces or Orthoses: Spinal Brace Spinal Brace: Thoracolumbosacral orthotic Spinal Brace Comments: TLSO when OOB Restrictions Weight Bearing Restrictions: No       Mobility Bed Mobility Overal bed mobility: Needs Assistance Bed Mobility: Rolling;Sidelying to Sit;Sit to Sidelying Rolling: Modified independent (Device/Increase time) Sidelying to sit:  Supervision     Sit to sidelying: Supervision General bed mobility comments: Supervision with use of bed rails. Cues for back precautions needed throughout  Transfers                 General transfer comment: Educated and planned to attempt sit to stand transfers with [place]. However, pt declined reporting impaired sensation in L foot.     Balance Overall balance assessment: Needs assistance Sitting-balance support: No upper extremity supported;Feet supported Sitting balance-[name]: Fair                                     ADL either performed or assessed with clinical judgement   ADL Overall ADL's : Needs assistance/impaired Eating/Feeding: Independent;Bed level Eating/Feeding Details (indicate cue type and reason): Finishing up breakfast on entry                                   General ADL Comments: Total A to don TLSO sitting EOB.Able to sit EOB for 5 minutes before self ending OOB attempts     Vision   Vision Assessment?: No apparent visual deficits   Perception     Praxis      Cognition Arousal/Alertness: Awake/alert Behavior During Therapy: WFL for tasks assessed/performed;Impulsive Overall Cognitive Status: No family/caregiver present to determine baseline cognitive functioning Area of Impairment: Problem solving;Safety/judgement;Following commands;Memory                 Orientation Level: Disoriented to;Time   Memory: Decreased short-term memory;Decreased recall of precautions Following Commands: Follows one step commands with increased time Safety/Judgement: Decreased awareness of safety;Decreased awareness of deficits  Awareness: Emergent Problem Solving: Requires verbal cues General Comments: Pt unable to recall back precautions, poor STM regarding pain meds receival.        Exercises     Shoulder Instructions       General Comments Pt continues to report high levels of pain though facial expression  do not match pt's reported pain. Pt unable to recall back precautions today, focused on when he should receive IV dialudid, calling RN x2 during session    Pertinent Vitals/ Pain       Pain Assessment: 0-10 Pain Score: 10-Worst pain ever Faces Pain Scale: Hurts little more Pain Location: neck to back Pain Descriptors / Indicators: Aching;Sore;Discomfort;Grimacing;Constant Pain Intervention(s): Monitored during session;Premedicated before session;Patient requesting pain meds-RN notified;Repositioned  Home Living                                          Prior Functioning/Environment              Frequency  Min 2X/week        Progress Toward Goals  OT Goals(current goals can now be found in the care plan section)  Progress towards OT goals: Not progressing toward goals - comment (self limiting with pain)  Acute Rehab OT Goals Patient Stated Goal: pain control, get stronger and get home OT Goal Formulation: With patient Time For Goal Achievement: 10/02/20 Potential to Achieve Goals: Good ADL Goals Pt Will Perform Lower Body Bathing: with min assist;sit to/from stand;sitting/lateral leans;with adaptive equipment Pt Will Perform Lower Body Dressing: with min assist;sitting/lateral leans;sit to/from stand;with adaptive equipment Pt Will Transfer to Toilet: with min assist;stand pivot transfer;bedside commode Additional ADL Goal #1: Pt will recall and apply spinal precautions to ADL routine  Plan Discharge plan remains appropriate    Co-evaluation                 AM-PAC OT "6 Clicks" Daily Activity     Outcome Measure   Help from another person eating meals?: None Help from another person taking care of personal grooming?: A Little Help from another person toileting, which includes using toliet, bedpan, or urinal?: A Lot Help from another person bathing (including washing, rinsing, drying)?: A Lot Help from another person to put on and taking off  regular upper body clothing?: A Little Help from another person to put on and taking off regular lower body clothing?: A Lot 6 Click Score: 16    End of Session Equipment Utilized During Treatment: Back brace  OT Visit Diagnosis: Unsteadiness on feet (R26.81);Other abnormalities of gait and mobility (R26.89);Muscle weakness (generalized) (M62.81);Pain Pain - part of body:  (back)   Activity Tolerance Patient limited by pain   Patient Left in bed;with bed alarm set;with call bell/phone within reach   Nurse Communication Mobility status;Patient requests pain meds        Time: 5956-3875 OT Time Calculation (min): 26 min  Charges: OT General Charges $OT Visit: 1 Visit OT Treatments $Self Care/Home Management : 8-22 mins $Therapeutic Activity: 8-22 mins  Layla Maw, OTR/L   Layla Maw 09/18/2020, 10:32 AM

## 2020-09-19 DIAGNOSIS — E875 Hyperkalemia: Secondary | ICD-10-CM

## 2020-09-19 DIAGNOSIS — G9520 Unspecified cord compression: Secondary | ICD-10-CM

## 2020-09-19 DIAGNOSIS — Z794 Long term (current) use of insulin: Secondary | ICD-10-CM

## 2020-09-19 DIAGNOSIS — K5903 Drug induced constipation: Secondary | ICD-10-CM

## 2020-09-19 LAB — VITAMIN D 25 HYDROXY (VIT D DEFICIENCY, FRACTURES): Vit D, 25-Hydroxy: 74.12 ng/mL (ref 30–100)

## 2020-09-19 LAB — GLUCOSE, CAPILLARY
Glucose-Capillary: 100 mg/dL — ABNORMAL HIGH (ref 70–99)
Glucose-Capillary: 132 mg/dL — ABNORMAL HIGH (ref 70–99)
Glucose-Capillary: 90 mg/dL (ref 70–99)
Glucose-Capillary: 117 mg/dL — ABNORMAL HIGH (ref 70–99)

## 2020-09-19 MED ORDER — HYDROMORPHONE HCL 1 MG/ML IJ SOLN
0.5000 mg | Freq: Three times a day (TID) | INTRAMUSCULAR | Status: DC | PRN
  Administered 2020-09-19 – 2020-09-21 (×6): 0.5 mg via INTRAVENOUS
  Filled 2020-09-19 (×6): qty 0.5

## 2020-09-19 MED ORDER — HYDRALAZINE HCL 50 MG PO TABS
50.0000 mg | ORAL_TABLET | Freq: Three times a day (TID) | ORAL | Status: DC
  Administered 2020-09-19 – 2020-09-20 (×6): 50 mg via ORAL
  Filled 2020-09-19 (×6): qty 1

## 2020-09-19 NOTE — Progress Notes (Addendum)
PT Cancellation Note  Patient Details Name: Thomas [name] MRN: [id number] DOB: 1951-10-10   Cancelled Treatment:    Reason Eval/Treat Not Completed: (P) Other (comment) (pt sleeping deeply, not easily awoken.)  (12:50pm) Will continue attempts in afternoon as schedule permits. 1604: Second attempt, pt awake but c/o severe LBP, declined PT session despite encouragement and requesting to speak with MD, RN notified.   Kara Pacer Clydie Dillen 09/19/2020, 12:52 PM

## 2020-09-19 NOTE — TOC Progression Note (Addendum)
Transition of Care [facility]) - Progression Note    Patient Details  Name: Thomas [name] MRN: [id number] Date of Birth: [birth year]  Transition of Care [facility]) CM/SW [place], RN Phone Number: 09/19/2020, 1:31 PM  Clinical Narrative:    Case management spoke with Daleen Bo, CM at [facility] facilities and they are viewing the patient's clinicals to see if the facility can offer a bed to the patient.  Will continue to follow the patient for [facility] placement.  09/19/2020 - I called and spoke with the brother, BJ on the phone and updated him concerning trying to find [place] placement for the patient.  I mentioned to him that as soon as I receive confirmation of the [facility] facility - I will update him as well.  Will continue to follow the patient for [facility] placement.     Expected Discharge Plan: [place] Barriers to Discharge: Continued Medical Work up  Expected Discharge Plan and Services Expected Discharge Plan: [place]   Discharge Planning Services: CM Consult Post Acute Care Choice: [place] Living arrangements for the past 2 months: [place]                                       Social Determinants of Health (SDOH) Interventions    Readmission Risk Interventions Readmission Risk Prevention Plan 09/17/2019  Transportation Screening Complete  PCP or Specialist Appt within 5-7 Days Not Complete  Not Complete comments snf vs home  Home Care Screening Complete  Medication Review (RN CM) Complete  Some recent data might be hidden

## 2020-09-19 NOTE — Hospital Course (Addendum)
Spinal cord decompression 2/2 injury: During admission at OSH, patient suffered spinal injury. After being discharged home from OSH, patient was subsequently sent to [facility] by EMS as patient was unable to care for himself at home. MRI obtained at [facility] showed spinal cord decompression. Patient transferred to [facility], where T10-T12 fusion and spinal cord decompression was performed. During hospitalization, pain control was difficult given he is on chronic opioid medications. Chronic discitis, osteomyelitis T10-T12: Patient has had multiple previous hospitalizations for spinal osteomyelitis. Aspirate culture collected during surgery without growth. Per Dr. Reatha Armour, there was visible evidence of active infection in the spine. Patient started on broad spectrum antibiotics for 8 weeks. He will take daptomycin, ceftriaxone, and rifampin until 10/30/20. Opioid-induced constipation: Given patient's increased opioid requirement during hospitalization, he subsequently developed worsening constipation. Patient required multiple laxatives, including Senokot-S, Miralax, Colace, and lactulose. Patient was started on Movantik 25mg  qd (increased dosage due to interaction with rifampin). Unsuccessful attempts by milk and molasses enema, milk of magnesia, and free water enema. Patient was able to have large bowel movement after SMOG enema.  # Acute on Chronic Lower Back Pain 2/2 Spinal Cord Compression and Chronic Osteomyelitis / Discitis of T10-T12 Patient is POD 17 T10-T12 fusion and spinal cord decompression. He responded well to the increase in Oxycontin yesterday from 20mg  to 30mg  twice daily and had not requested PRN Dilaudid overnight. He has no focal neurological deficits aside from chronic numbness below the level of the ankle on the right. He has a PICC line in place for long-term ABX due to heavy history of MRSA; however, recent culture was negative. Unfortunately, Daptomycin and Ceftaroline are cost prohibitive for his  acceptance to [facility].  - Appreciate ID, pharmacy and neurosurgery recs - Continue Vancomycin (to trough 15-20), Ceftriaxone, and PO Rifampin until 10/30/20 via PICC with close follow up in ID clinic in 12 days with Dr. West Bali for lab monitoring; SW will check to be sure this is covered by [facility] on Monday.  - Will continue Oxycontin 30mg  twice daily given improvement in pain  - Will switch Oxycodone 5mg  q8 hours to Percocet 7.5-325mg  q8 hours for increased pain control  - Continue to hold IV Dilaudid anticipating discharge soon - PT/OT on board  # Hypertension Patient's blood pressure has improved slightly from 180/100 to 160's/80s with increase in home metoprolol to 100mg  twice daily and hydralazine to 100mg  three times daily. - Continue Metoprolol 100mg  twice daily - Continue hydralazine to 100mg  three times daily - Continue home amlodipine 10mg  daily   # Opioid-Induced Constipation, Resolved Patient has recently had constipation in the setting of increased opioid use, with significant LLQ TTP. Now having regular BM daily x 3 days. - Continue Senokot S, Miralax, Movantik 25mg  daily - Continue Lactulose twice daily - SMOG enema PRN   # Hyperkalemia, Resolved - Continue monitoring biweekly BMP  # History of Vitamin D Deficiency - Continue 1000 units daily

## 2020-09-19 NOTE — Progress Notes (Incomplete)
   Subjective: *** Patient evaluated at bedside this AM. Somnolent, difficult to wake up. States he just went to sleep as was up whole night due to back pain. Notes was in extreme pain whole day yesterday.   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   Honor Junes, MD 09/19/2020, 8:48 AM Pager: @MYPAGER @ After 5pm on weekdays and 1pm on weekends: On Call pager [phone]

## 2020-09-19 NOTE — Progress Notes (Signed)
Subjective:   Thomas [name] was initially resting comfortably but began to endorse severe low back pain as we were in the room. He denies any abdominal pain and says he continues to pass gas, saying his last bowel movement was 09/17/20 in the evening. He denies any new numbness or weakness in his legs and denies any other symptoms. He is eager to sleep saying his pain kept him awake last night.  Objective:  Vital signs in last 24 hours: Vitals:   09/18/20 1449 09/18/20 1915 09/19/20 0341 09/19/20 0824  BP: (!) 166/94 (!) 173/97 (!) 162/99 (!) 182/99  Pulse: 72 72 73 69  Resp: 18 16 14 17   Temp: 98.5 F (36.9 C) 97.6 F (36.4 C) 98.7 F (37.1 C) 98.3 F (36.8 C)  TempSrc: Oral Oral Oral Oral  SpO2: 100% 99% 97% 98%  Weight:      Height:       General: Patient appears tired but well, in no acute distress. Eyes: Sclera non-icteric. No conjunctival injection.  Respiratory: Lungs are CTA, bilaterally. No wheezes, rales, or rhonchi.  Cardiovascular: Regular rate and rhythm. No murmurs, rubs, or gallops. No lower extremity edema. Abdominal: Soft and mildly distended with significant LLQ tenderness to palpation with guarding but no rebound. Bowel sounds intact. 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 ROMI in bilateral lower extremities.  Skin: No lesions. No rashes.  Neurological: Sensation to light touch is intact below the right knee to the level of the foot. There is no sensation present in the right foot. Sensation intact in the left amputation stump.   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 He continues to endorse severe pain and is taking PRN Dilaudid regularly q8 hours. He has no focal neurological deficits aside from chronic numbness below the level of the ankle on the right. He has a PICC line in place for long-term ABX.  - Appreciate ID and neurosurgery recs - Continue scheduled Oxycontin 20mg  twice daily and Oxycodone 5mg  q8 hours - Will decrease IV Dilaudid to 0.5mg  q8 hours PRN in hopes of weaning patient off of IV pain medications  - Continue Daptomycin, Ceftriaxone, and Rifampin until 10/30/20  - PT/OT  - TLSO when out of bed  - SNF pending   # Opioid-Induced Constipation Patient has recently had constipation in the setting of increased opioid use, with significant LLQ TTP. Last BM was 2 evenings ago.  - Continue Senokot S, Miralax, Movantik 25mg  daily - Continue Lactulose twice daily; consider increasing to three times daily if needed vs. Methylnaltrexone  - SMOG enema PRN   # Hypertension Patient's blood pressure continues to remain elevated in the 578'I systolic. Patient's home metoprolol dose was increased to 100mg  twice daily recently and hydralazine (25mg  three times daily) was added yesterday. - Continue Metoprolol 100mg  twice daily - Will increase hydralazine to 50mg  three times daily (home dose)  - Continue amlodipine 10mg  daily   # Hyperkalemia, Resolved Potassium of 5.2 improved to 4.3 s/p [facility]  yesterday. - Will repeat BMP for tomorrow   Prior to Admission Living Arrangement: Home Anticipated Discharge Location: SNF Barriers to Discharge: Pain Control and SNF placement  Dispo: Anticipated discharge ~ 5 days.   Jeralyn Bennett, MD 09/19/2020, 2:34 PM Pager: [phone] After 5pm on weekdays and 1pm on weekends: On Call pager [phone]

## 2020-09-20 LAB — BASIC METABOLIC PANEL
Anion gap: 13 (ref 5–15)
BUN: 20 mg/dL (ref 8–23)
CO2: 19 mmol/L — ABNORMAL LOW (ref 22–32)
Calcium: 8.7 mg/dL — ABNORMAL LOW (ref 8.9–10.3)
Chloride: 108 mmol/L (ref 98–111)
Creatinine, Ser: 1.66 mg/dL — ABNORMAL HIGH (ref 0.61–1.24)
GFR, Estimated: 45 mL/min — ABNORMAL LOW (ref >60)
Glucose, Bld: 89 mg/dL (ref 70–99)
Potassium: 4 mmol/L (ref 3.5–5.1)
Sodium: 140 mmol/L (ref 135–145)

## 2020-09-20 LAB — GLUCOSE, CAPILLARY
Glucose-Capillary: 83 mg/dL (ref 70–99)
Glucose-Capillary: 103 mg/dL — ABNORMAL HIGH (ref 70–99)
Glucose-Capillary: 146 mg/dL — ABNORMAL HIGH (ref 70–99)

## 2020-09-20 MED ORDER — SODIUM CHLORIDE 0.9 % IV SOLN
600.0000 mg | Freq: Three times a day (TID) | INTRAVENOUS | Status: DC
  Administered 2020-09-20 – 2020-09-21 (×2): 600 mg via INTRAVENOUS
  Filled 2020-09-20 (×5): qty 600

## 2020-09-20 MED ORDER — OXYCODONE HCL ER 15 MG PO T12A
30.0000 mg | EXTENDED_RELEASE_TABLET | Freq: Two times a day (BID) | ORAL | Status: DC
  Administered 2020-09-20 – 2020-10-03 (×26): 30 mg via ORAL
  Filled 2020-09-20 (×26): qty 2

## 2020-09-20 MED ORDER — VITAMIN D 25 MCG (1000 UNIT) PO TABS
1000.0000 [IU] | ORAL_TABLET | Freq: Every day | ORAL | Status: DC
  Administered 2020-09-20 – 2020-10-03 (×14): 1000 [IU] via ORAL
  Filled 2020-09-20 (×14): qty 1

## 2020-09-20 NOTE — Progress Notes (Signed)
Pt is being transitioned to ceftaroline + rifampin for his discitis. Clarified with Dr Konrad Penta and we will dc ceftriaxone.  Onnie Boer, PharmD, BCIDP, AAHIVP, CPP Infectious Disease Pharmacist 09/20/2020 5:18 PM

## 2020-09-20 NOTE — Progress Notes (Incomplete)
Subjective:   Mr. Karnes did not have any reported events overnight, although he continues to endorse difficulty sleeping due to severe back pain. He states his pain goes the entire way down his mid to lower back down his spine. He denies any fevers, chills, new numbness, or weakness or any other symptoms at this time. He continues to request increased pain medication dosing although understands his pain will never be fully eliminated given his extensive history. His constipation has improved, which he attributes to lactulose, with a bowel movement daily x 3 days.   Objective:  Vital signs in last 24 hours: Vitals:   09/20/20 0815 09/20/20 1006 09/20/20 1007 09/20/20 1008  BP: (!) 180/100 (!) 180/100 (!) 180/100 (!) 180/100  Pulse: 73 73    Resp: 17     Temp: 98 F (36.7 C)     TempSrc: Oral     SpO2: 97%     Weight:      Height:       General: Patient appears tired and chronically ill, but in no acute distress. Eyes: Sclera non-icteric. No conjunctival injection.  Respiratory: Lungs are CTA, bilaterally. No wheezes, rales, or rhonchi.  Cardiovascular: Regular rate and rhythm. No murmurs, rubs, or gallops. No lower extremity edema. Abdominal: Soft and mildly distended with significant LLQ tenderness to palpation with guarding but no rebound. Bowel sounds intact. 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 ROMI in bilateral lower extremities. There is mild swelling and erythema overlying thoracic spine surgical incision site with tenderness to palpation but no fluctuance or drainage. There is no lower spinal tenderness to palpation.  Skin: See above. No rashes. Warm and dry with mild jaundice.  Neurological: Sensation to light touch is intact below the right knee to the level of the foot. There is no sensation present in the right foot. Sensation intact in the left amputation stump.   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 He continues to endorse severe pain and is taking PRN Dilaudid regularly q8 hours. He has no focal neurological deficits aside from chronic numbness below the level of the ankle on the right. He has a PICC line in place for long-term ABX.  - Appreciate ID and neurosurgery recs - Given continued pain, will increase Oxycontin from 20mg  to 30mg  twice daily  - Continue Oxycodone 5mg  q8 hours - Continue IV Dilaudid to 0.5mg  q8 hours PRN with hope of weaning patient off of IV pain medications  - Continue Daptomycin, Ceftriaxone, and Rifampin until 10/30/20  - PT/OT  - TLSO when out of bed  - SNF pending   # Opioid-Induced Constipation Patient has recently had constipation in the setting of increased opioid use, with significant LLQ TTP. Last BM was 2 evenings ago.  - Continue Senokot S, Miralax, Movantik 25mg  daily - Continue Lactulose twice daily; consider increasing to three times daily if needed vs. Methylnaltrexone  - SMOG enema PRN   # Hypertension  Patient's blood pressure continues to remain elevated in the 203'T systolic. Patient's home metoprolol dose was increased to 100mg  twice daily recently and hydralazine (25mg  three times daily) was added yesterday. - Continue Metoprolol 100mg  twice daily - Will increase hydralazine to 50mg  three times daily (home dose)  - Continue amlodipine 10mg  daily   # Hyperkalemia, Resolved Potassium of 5.2 improved to 4.3 s/p Lokelma yesterday. - Will repeat BMP for tomorrow   Prior to  Admission Living Arrangement: Home Anticipated Discharge Location: SNF Barriers to Discharge: Pain Control and SNF placement  Dispo: Anticipated discharge ~ 5 days.   Jeralyn Bennett, MD 09/20/2020, 1:40 PM Pager: [phone] After 5pm on weekdays and 1pm on weekends: On Call pager [phone]

## 2020-09-20 NOTE — TOC Progression Note (Signed)
Transition of Care [facility]) - Progression Note    Patient Details  Name: Thomas [name] MRN: [id number] Date of Birth: July 06, 1951  Transition of Care [facility]) CM/SW [place], RN Phone Number: 09/20/2020, 3:49 PM  Clinical Narrative:    Case management called and spoke with Marita [place] Deal at [facility] in [place] and I was transferred to speak with Margaretmary Lombard, CM that was willing to accept the patient for admission once a long term care bed is available at the [facility] facility in [place], [place].  I spoke with the patient and brother at the bedside and explained that this was the only accepting facility able to offer the patient a SNf bed for admission since the patient no long has Medicare days available as a payor source.  I reached out to Dr. Rivka Safer medical team and asked that they speak with Infectious Disease MD to have the Daptomycin changed to a different medication so that the patient could be transferred to an accepting [facility] facility.  I called and spoke with Marita Kansas Deal, CM at [facility] and explained that the patient would be receiving Rocephin 2 gm IV every 24 hours and Teflaro 600 mg IV every 8 hours IV in place of the Daptomycin IV.  I will continue to follow the patient for possible admission to [facility] facility most likely on 09/24/2020 for Long Term care admission.   Expected Discharge Plan: [place] Barriers to Discharge: Continued Medical Work up  Expected Discharge Plan and Services Expected Discharge Plan: [place]   Discharge Planning Services: CM Consult Post Acute Care Choice: [place] Living arrangements for the past 2 months: [place]                                       Social Determinants of Health (SDOH) Interventions    Readmission Risk Interventions Readmission Risk Prevention Plan 09/17/2019  Transportation Screening Complete  PCP or Specialist Appt  within 5-7 Days Not Complete  Not Complete comments snf vs home  Home Care Screening Complete  Medication Review (RN CM) Complete  Some recent data might be hidden

## 2020-09-20 NOTE — Progress Notes (Addendum)
Subjective:   Mr. Siever did not have any acute events overnight. He continues to endorse severe back pain extending from the midspine to the lower lumbar spine that keeps him up at night. He denies any fevers, chills, new numbness, or weakness. He has been able to have daily bowel movements the last 3 days in a row.   Objective:  Vital signs in last 24 hours: Vitals:   09/20/20 1007 09/20/20 1008 09/20/20 1547 09/20/20 1547  BP: (!) 180/100 (!) 180/100 (!) 175/100 (!) 175/113  Pulse:    73  Resp:    17  Temp:      TempSrc:      SpO2:    99%  Weight:      Height:       General: Patient appears uncomfortable. No acute distress.  Eyes: Sclera non-icteric. No conjunctival injection.  Respiratory: Lungs are CTA, bilaterally. No wheezes, rales, or rhonchi.  Cardiovascular: Regular rate and rhythm. No murmurs, rubs, or gallops. No lower extremity edema. Abdominal: Soft and mildly distended with significant LLQ tenderness to palpation with guarding but no rebound. Bowel sounds intact. 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 ROMI in bilateral lower extremities.  Skin: No lesions. No rashes.  Neurological: Sensation to light touch is intact below the right knee to the level of the foot. There is no sensation present in the right foot. Sensation intact in the left amputation stump.   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 He continues to endorse severe pain and  is taking PRN Dilaudid regularly q8 hours. He has no focal neurological deficits aside from chronic numbness below the level of the ankle on the right. He has a PICC line in place for long-term ABX.  - Appreciate ID and neurosurgery recs - Will increase Oxycontin 20mg  twice daily to 30mg  twice daily given continued pain with realistic pain goals and reports of 30mg  BID dosing at nursing home. - Continue Oxycodone 5mg  q8 hours - Continue IV Dilaudid to 0.5mg  q8 hours PRN in hopes of weaning patient off of IV pain medications  - Patient may have a bed at [facility] in [facility]; Daptomycin switched to Ceftaroline 600mg  q8 hours; continue with Rifampin until 10/30/20 via PICC - PT/OT on board - TLSO when out of bed   # Opioid-Induced Constipation Patient has recently had constipation in the setting of increased opioid use, with significant LLQ TTP. Now having regular BM daily x 3 days. - Continue Senokot S, Miralax, Movantik 25mg  daily - Continue Lactulose twice daily - SMOG enema PRN   # Hypertension Patient's blood pressure continues to remain elevated in the 161'W systolic.  Patient's home metoprolol dose was increased to 100mg  twice daily recently and hydralazine 50mg  was three times daily was added.  - Continue Metoprolol 100mg  twice daily - Continue hydralazine to 50mg  three times daily (home dose)  - Continue amlodipine 10mg  daily  - Continue to monitor with  increase in pain medication dosing  # Hyperkalemia, Resolved Potassium of 5.2 recently to 4.0 after Lokelma. - Continue monitoring  # History of Vitamin D Deficiency Vitamin D levels were normal.  - Switched patient from 50,000 units weekly to 1000 units daily vitamin D.  Prior to Admission Living Arrangement: Home Anticipated Discharge Location: SNF Barriers to Discharge: Pain Control and SNF placement  Dispo: Anticipated discharge in the next couple days.  Jeralyn Bennett, MD 09/20/2020, 5:00 PM Pager:  [phone] After 5pm on weekdays and 1pm on weekends: On Call pager [phone]

## 2020-09-20 NOTE — Progress Notes (Signed)
Patient is co having continued pain - unable to give prn at this point due to being too early since last dose.  I have discussed this with the patient.

## 2020-09-20 NOTE — Plan of Care (Signed)

## 2020-09-20 NOTE — Progress Notes (Signed)
PT Cancellation Note  Patient Details Name: Thomas [name] MRN: [id number] DOB: 1951-08-10   Cancelled Treatment:    Reason Eval/Treat Not Completed: (P) Pain limiting ability to participate Pt refusing to work with PTA and requesting to speak with MD.   Kara Pacer Breyona Swander 09/20/2020, 3:14 PM

## 2020-09-20 NOTE — Progress Notes (Signed)
The patient was snoring and appeared to sleep soundly when pain medication taken to him.

## 2020-09-21 DIAGNOSIS — E559 Vitamin D deficiency, unspecified: Secondary | ICD-10-CM

## 2020-09-21 DIAGNOSIS — M4644 Discitis, unspecified, thoracic region: Secondary | ICD-10-CM

## 2020-09-21 DIAGNOSIS — I129 Hypertensive chronic kidney disease with stage 1 through stage 4 chronic kidney disease, or unspecified chronic kidney disease: Secondary | ICD-10-CM

## 2020-09-21 DIAGNOSIS — N179 Acute kidney failure, unspecified: Secondary | ICD-10-CM

## 2020-09-21 LAB — GLUCOSE, CAPILLARY
Glucose-Capillary: 108 mg/dL — ABNORMAL HIGH (ref 70–99)
Glucose-Capillary: 157 mg/dL — ABNORMAL HIGH (ref 70–99)
Glucose-Capillary: 139 mg/dL — ABNORMAL HIGH (ref 70–99)

## 2020-09-21 MED ORDER — VANCOMYCIN HCL 1500 MG/300ML IV SOLN
1500.0000 mg | INTRAVENOUS | Status: DC
  Filled 2020-09-21: qty 300

## 2020-09-21 MED ORDER — HYDRALAZINE HCL 50 MG PO TABS
100.0000 mg | ORAL_TABLET | Freq: Three times a day (TID) | ORAL | Status: DC
  Administered 2020-09-21 – 2020-10-03 (×37): 100 mg via ORAL
  Filled 2020-09-21 (×38): qty 2

## 2020-09-21 MED ORDER — SODIUM CHLORIDE 0.9 % IV SOLN
2.0000 g | INTRAVENOUS | Status: DC
  Administered 2020-09-21 – 2020-10-02 (×12): 2 g via INTRAVENOUS
  Filled 2020-09-21 (×13): qty 20

## 2020-09-21 MED ORDER — VANCOMYCIN HCL 1250 MG/250ML IV SOLN
1250.0000 mg | INTRAVENOUS | Status: DC
  Administered 2020-09-21 – 2020-09-27 (×7): 1250 mg via INTRAVENOUS
  Filled 2020-09-21 (×8): qty 250

## 2020-09-21 NOTE — Care Management Important Message (Signed)
Important Message  Patient Details  Name: Thomas [name] MRN: [id number] Date of Birth: 08-24-1951   Medicare Important Message Given:  Yes     Neo Yepiz P Janki Dike 09/21/2020, 3:05 PM

## 2020-09-21 NOTE — Progress Notes (Signed)
PHARMACY CONSULT NOTE FOR:  OUTPATIENT  PARENTERAL ANTIBIOTIC THERAPY (OPAT)  Indication: vertebral osteomyelitis  Regimen: Vancomycin 1250 mg every 24 hours + ceftriaxone 2 gm IV  every 24 hours + rifampin 300 mg PO BID  End date: 10/30/2020  IV antibiotic discharge orders are pended. To discharging provider:  please sign these orders via discharge navigator,  Select New Orders & click on the button choice - Manage This Unsigned Work.     Thank you for allowing pharmacy to be a part of this patient's care.  Jimmy Footman, PharmD, BCPS, [place] Infectious Diseases Clinical Pharmacist Phone: [phone] 09/21/2020, 3:04 PM

## 2020-09-21 NOTE — Progress Notes (Signed)
The patient called out complaining that "his dilaudid medication was 20 minutes late.  The patient was made aware that his IV dilaudid has been discontinued due to planning his soon to be discharge.  The patient became angry and started stating " I hope everyone of those bastards, get in a car wreck and break their neck".  Attempted to given support by  explaining the situation with him.

## 2020-09-21 NOTE — Progress Notes (Signed)
Pharmacy Antibiotic Note  Thomas [name] is a 69 y.o. male admitted on 08/29/2020 with acute on chronic vertebral infection. Pharmacy has been consulted for vancomycin dosing. Unfortunately, SNF will not take patient on Daptomycin or Ceftaroline though he has had issues with AKI in the past on Vancomycin. Current Scr -1.66 with CrCl ~ 50 ml/min. Will dose very conservatively.   Plan: Vancomycin 1250 mg every 24 hours  Will not load as patient several days into therapy Monitor renal function  Will place OPAT orders   Height: 6\' 7"  (200.7 cm) Weight: 95.9 kg (211 lb 6.7 oz) IBW/kg (Calculated) : 93.7  Temp (24hrs), Avg:98 F (36.7 C), Min:97.6 F (36.4 C), Max:98.3 F (36.8 C)  Recent Labs  Lab 09/17/20 0009 09/18/20 0933 09/20/20 0245  WBC 9.4  --   --   CREATININE 1.91* 1.52* 1.66*    Estimated Creatinine Clearance: 57.2 mL/min (A) (by C-G formula based on SCr of 1.66 mg/dL (H)).    Allergies  Allergen Reactions  . Other Other (See Comments)    Blood pressure issues Per [facility] hospital: Pt states he can only take these pain meds or else he gets very sick: Oxycontin,morphine,demerol, and dilaudid are the only pain meds pt states he can take.    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 2.4.2021: He also reports that he currently take Oxycontin.   Marland Kitchen Oxymorphone Other (See Comments)    Causes kidney problems  . Aspirin Nausea And Vomiting    Per [facility]  . Beta Vulgaris Nausea And Vomiting    Beets  . Buspirone Other (See Comments)    unknown  . Cabbage Nausea And Vomiting  . Codeine Other (See Comments)    unknown  . Dolobid [Diflunisal]     Unknown reaction  . Fish Allergy Nausea And Vomiting  . Fish-Derived Products Nausea And Vomiting  . Methadone     Unknown reaction  . Pentazocine Other (See Comments)    Unknown reaction  . Propoxyphene Other (See Comments)    Unknown  reaction  . Shellfish Allergy Nausea And Vomiting  . Sulfa Antibiotics Hives  . Sulfasalazine Other (See Comments)    Unknown reaction  . Vistaril [Hydroxyzine]     Unknown reaction  . Amoxicillin Rash    Per [facility]     Thank you for allowing pharmacy to be a part of this patient's care.  Jimmy Footman, PharmD, BCPS, BCIDP Infectious Diseases Clinical Pharmacist Phone: [phone] 09/21/2020 2:55 PM

## 2020-09-21 NOTE — Plan of Care (Signed)

## 2020-09-21 NOTE — TOC Progression Note (Signed)
Transition of Care [facility]) - Progression Note    Patient Details  Name: Thomas [name] MRN: [id number] Date of Birth: [birth year]  Transition of Care [facility]) CM/SW [place], RN Phone Number: 09/21/2020, 11:02 AM  Clinical Narrative:    Case management spoke with Margaretmary Lombard, CM with  [facility] and they are unable to admit the patient to the facility while taking the medications, Telflaro since it is too expensive for them to supply and admit the patient under his Medicaid payor source.  I reached out to Dr. Philipp Ovens to notify her in case the patient could be switched to a less expensive medication to cover his infection.  Will continue to follow the patient for admission to a [facility] facility when able.   Expected Discharge Plan: [place] Barriers to Discharge: Continued Medical Work up  Expected Discharge Plan and Services Expected Discharge Plan: [place]   Discharge Planning Services: CM Consult Post Acute Care Choice: [place] Living arrangements for the past 2 months: [place]                                       Social Determinants of Health (SDOH) Interventions    Readmission Risk Interventions Readmission Risk Prevention Plan 09/17/2019  Transportation Screening Complete  PCP or Specialist Appt within 5-7 Days Not Complete  Not Complete comments snf vs home  Home Care Screening Complete  Medication Review (RN CM) Complete  Some recent data might be hidden

## 2020-09-21 NOTE — Plan of Care (Signed)

## 2020-09-21 NOTE — Progress Notes (Signed)
Brief ID Progress Note:    Thomas [name] is a 69 y.o. male with acute on chronic vertebral infection, now recently stabilized with hardware in the OR. Heavy history of MRSA, but recently culture negative.   Unfortunately we are not able to secure Daptomycin or Ceftaroline for him due to cost prohibitions.  Will have to use Vancomycin (which he has had AKI with in the past) and Ceftriaxone with PO rifampin.   He has follow up in ID clinic with Dr. West Bali in 12 days to watch him and safety labs closely.   D/W Dr. Konrad Penta and Dr. Juleen China.    OPAT ORDERS:  Diagnosis: vertebral osteomyelitis, complicated by hardware  Culture Result: culture negative, hx mrsa  Allergies  Allergen Reactions  . Other Other (See Comments)    Blood pressure issues Per [facility] hospital: Pt states he can only take these pain meds or else he gets very sick: Oxycontin,morphine,demerol, and dilaudid are the only pain meds pt states he can take.    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 2.4.2021: He also reports that he currently take Oxycontin.   Marland Kitchen Oxymorphone Other (See Comments)    Causes kidney problems  . Aspirin Nausea And Vomiting    Per [facility]  . Beta Vulgaris Nausea And Vomiting    Beets  . Buspirone Other (See Comments)    unknown  . Cabbage Nausea And Vomiting  . Codeine Other (See Comments)    unknown  . Dolobid [Diflunisal]     Unknown reaction  . Fish Allergy Nausea And Vomiting  . Fish-Derived Products Nausea And Vomiting  . Methadone     Unknown reaction  . Pentazocine Other (See Comments)    Unknown reaction  . Propoxyphene Other (See Comments)    Unknown reaction  . Shellfish Allergy Nausea And Vomiting  . Sulfa Antibiotics Hives  . Sulfasalazine Other (See Comments)    Unknown reaction  . Vistaril [Hydroxyzine]     Unknown reaction  . Amoxicillin Rash    Per [facility]      Discharge antibiotics to be given via PICC line:  Per pharmacy protocol VANCOMYCIN  Aim for Vancomycin trough 15-20 or AUC 400-550 (unless otherwise indicated)  + CEFTRIAXONE   + RIFAMPIN PO   Duration: 8 weeks  End Date: 10/30/2020  [facility] Care Per Protocol with [employer] Use: Home health RN for IV administration and teaching, line care and labs.    Labs weekly while on IV antibiotics: _x_ CBC with differential _x_ BMP **TWICE WEEKLY _X_ CMP _X_ CRP _X_ ESR _X_ Vancomycin trough __ CK  _X_ Please pull PIC at completion of IV antibiotics __ Please leave PIC in place until doctor has seen patient or been notified  Fax weekly labs to [phone]  Clinic Follow Up Appt: As previously outlined   Janene Madeira, MSN, NP-C [place] for Infectious Disease [email or web address] Pager: [phone] Office: [phone] [place]: [phone]

## 2020-09-21 NOTE — Progress Notes (Addendum)
Subjective:   Mr. Vivanco did not have any acute events overnight. Evaluated at bedside this AM. He states his back continues to hurt him, although he is able to sleep better. He had another bowel movement this morning. He continues to have some abdominal pain but believes this is chronic, due to his recent constipation. He is excited about going to [facility] as he says he knows the manager since childhood.   Objective:  Vital signs in last 24 hours: Vitals:   09/21/20 1057 09/21/20 1103 09/21/20 1309 09/21/20 1538  BP: (!) 173/98 (!) 173/98 (!) 153/89 (!) 153/89  Pulse: 64  64   Resp:   15   Temp:   98.1 F (36.7 C)   TempSrc:   Oral   SpO2:   93%   Weight:      Height:       General: Patient was sleeping comfortably, in no acute distress.  Eyes: Sclera non-icteric. No conjunctival injection.  Respiratory: Lungs are CTA, bilaterally anteriorly. No wheezes, rales, or rhonchi.  Cardiovascular: Regular rate and rhythm. No murmurs, rubs, or gallops. No lower extremity edema. Abdominal: Soft with significant LLQ tenderness to palpation with guarding that improves with distraction. No rebound. Bowel sounds intact. 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  Skin: Patient has a healing lesion along his mid-upper spine but no other lesions or rashes noted.  Neurological: Patient is somnolent, but responds to questions appropriately.  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 He responded well to the increase in Oxycontin yesterday from 20mg  to 30mg  twice daily and had not requested PRN Dilaudid overnight. He has no focal neurological deficits aside from chronic numbness below the level of the ankle on the right. He has a PICC line in place for long-term ABX due to heavy history of MRSA; however, recent culture was negative. Unfortunately, Daptomycin and Ceftaroline are cost prohibitive for his acceptance to [facility].  - Appreciate ID, pharmacy and neurosurgery recs - Will instead switch to Vancomycin (to trough 15-20), Ceftriaxone, and PO Rifampin until 10/30/20 via PICC with close follow up in ID clinic in 12 days with Dr. West Bali for lab monitoring  - Will continue Oxycontin 30mg  twice daily given improvement in pain  - Continue Oxycodone 5mg  q8 hours - Will discontinue IV Dilaudid  - PT/OT on board  # Opioid-Induced Constipation Patient has recently had constipation in the setting of increased opioid use, with significant LLQ TTP. Now having regular BM daily x 3 days. - Continue Senokot S, Miralax, Movantik 25mg  daily - Continue Lactulose twice daily - SMOG enema PRN   # Hypertension Patient's blood pressure has improved slightly from 180/100 to 160's/80's with increase in home metoprolol to 100mg  twice daily and better pain control. - Continue Metoprolol 100mg  twice daily - Will increase hydralazine to 100mg  three times daily - Continue home amlodipine 10mg  daily   # Hyperkalemia, Resolved - Continue monitoring biweekly BMP  #  History of Vitamin D Deficiency - Continue 1000 units daily  Prior to Admission Living Arrangement: Home Anticipated Discharge Location: SNF Barriers to Discharge: SNF Placement  Dispo: Anticipated discharge early next week  Jeralyn Bennett, MD 09/21/2020, 3:44 PM Pager: [phone] After 5pm on weekdays and 1pm on weekends: On Call pager [phone]

## 2020-09-22 LAB — BASIC METABOLIC PANEL
Anion gap: 9 (ref 5–15)
BUN: 21 mg/dL (ref 8–23)
CO2: 21 mmol/L — ABNORMAL LOW (ref 22–32)
Calcium: 8.5 mg/dL — ABNORMAL LOW (ref 8.9–10.3)
Chloride: 108 mmol/L (ref 98–111)
Creatinine, Ser: 1.84 mg/dL — ABNORMAL HIGH (ref 0.61–1.24)
GFR, Estimated: 40 mL/min — ABNORMAL LOW (ref >60)
Glucose, Bld: 90 mg/dL (ref 70–99)
Potassium: 4.4 mmol/L (ref 3.5–5.1)
Sodium: 138 mmol/L (ref 135–145)

## 2020-09-22 LAB — GLUCOSE, CAPILLARY: Glucose-Capillary: 111 mg/dL — ABNORMAL HIGH (ref 70–99)

## 2020-09-22 MED ORDER — OXYCODONE HCL 5 MG PO TABS
5.0000 mg | ORAL_TABLET | Freq: Three times a day (TID) | ORAL | Status: DC
  Administered 2020-09-22 – 2020-09-25 (×8): 5 mg via ORAL
  Filled 2020-09-22 (×8): qty 1

## 2020-09-22 MED ORDER — OXYCODONE-ACETAMINOPHEN 7.5-325 MG PO TABS
1.0000 | ORAL_TABLET | Freq: Three times a day (TID) | ORAL | Status: DC
  Administered 2020-09-22: 1 via ORAL
  Filled 2020-09-22 (×2): qty 1

## 2020-09-22 NOTE — Progress Notes (Signed)
   Subjective:   Today, Thomas [name] is sleeping as we walk into the room. He is hard to awake, and once awoken, pulls a blanket over his head stating "leave me alone", refusing further conversation or examination.   History and physical exam limited as patient wishes not to be evaluated.  Objective:  Vital signs in last 24 hours: Vitals:   09/21/20 1900 09/22/20 0300 09/22/20 0808 09/22/20 1644  BP: (!) 160/84 (!) 168/70 (!) 148/87 (!) 158/95  Pulse: 64 66 73 67  Resp: 16 17 18 18   Temp: 97.9 F (36.6 C) 98 F (36.7 C) 98.1 F (36.7 C) 98.2 F (36.8 C)  TempSrc: Oral Oral Oral Oral  SpO2: 96% 95% 100% 96%  Weight:      Height:       General: Patient was sleeping comfortably, in no acute distress.  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  Neurological: Patient is lethargic and does not open eyes with conversation.  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 He responded well to the increase in Oxycontin yesterday from 20mg  to 30mg  twice daily and had not requested PRN Dilaudid overnight. He has no focal neurological deficits aside from chronic numbness below the level of the ankle on the right. He has a PICC line in place for long-term ABX due to heavy history of MRSA; however, recent culture was negative. Unfortunately, Daptomycin and Ceftaroline are cost prohibitive for his acceptance to [facility].  - Appreciate ID, pharmacy and neurosurgery recs -  Continue Vancomycin (to trough 15-20), Ceftriaxone, and PO Rifampin until 10/30/20 via PICC with close follow up in ID clinic in 12 days with Dr. West Bali for lab monitoring; SW will check to be sure this is covered by [facility] on Monday.  - Will continue Oxycontin 30mg  twice daily given improvement in pain  - Continue Oxycodone 5mg  q8 hours  - Continue to hold IV Dilaudid anticipating discharge soon - PT/OT on board  # Hypertension Patient's blood pressure has improved slightly from 180/100 to 160's/80s with increase in home metoprolol to 100mg  twice daily and hydralazine to 100mg  three times daily. - Continue Metoprolol 100mg  twice daily - Continue hydralazine to 100mg  three times daily - Continue home amlodipine 10mg  daily   # AKI on CKD Stage IV Creatinine is stable and improved since admission.  - Continue to trend biweekly BMP's on ABX especially given Vancomycin renal injury in past.  # Opioid-Induced Constipation, Resolved - Continue Senokot S, Miralax, Movantik 25mg  daily - Continue Lactulose twice daily - SMOG enema PRN   # Hyperkalemia, Resolved - Continue monitoring biweekly BMP  Prior to Admission Living Arrangement: Home Anticipated Discharge Location: SNF Barriers to Discharge: SNF Placement  Dispo: Anticipated discharge early next week  Jeralyn Bennett, MD 09/22/2020, 8:15 PM Pager: [phone] After 5pm on weekdays and 1pm on weekends: On Call pager [phone]

## 2020-09-22 NOTE — Plan of Care (Signed)

## 2020-09-22 NOTE — Plan of Care (Signed)
  Problem: Health Behavior/Discharge Planning: Goal: Ability to manage health-related needs will improve Outcome: Not Progressing   Problem: Activity: Goal: Risk for activity intolerance will decrease Outcome: Not Progressing   Problem: Pain Managment: Goal: General experience of comfort will improve Outcome: Not Progressing   

## 2020-09-23 LAB — BASIC METABOLIC PANEL
Anion gap: 10 (ref 5–15)
BUN: 24 mg/dL — ABNORMAL HIGH (ref 8–23)
CO2: 21 mmol/L — ABNORMAL LOW (ref 22–32)
Calcium: 8.5 mg/dL — ABNORMAL LOW (ref 8.9–10.3)
Chloride: 108 mmol/L (ref 98–111)
Creatinine, Ser: 1.87 mg/dL — ABNORMAL HIGH (ref 0.61–1.24)
GFR, Estimated: 39 mL/min — ABNORMAL LOW (ref >60)
Glucose, Bld: 101 mg/dL — ABNORMAL HIGH (ref 70–99)
Potassium: 4.4 mmol/L (ref 3.5–5.1)
Sodium: 139 mmol/L (ref 135–145)

## 2020-09-23 LAB — GLUCOSE, CAPILLARY: Glucose-Capillary: 92 mg/dL (ref 70–99)

## 2020-09-23 NOTE — Plan of Care (Signed)
  Problem: Health Behavior/Discharge Planning: Goal: Ability to manage health-related needs will improve Outcome: Not Progressing   Problem: Activity: Goal: Risk for activity intolerance will decrease Outcome: Not Progressing   Problem: Elimination: Goal: Will not experience complications related to bowel motility Outcome: Progressing   Problem: Pain Managment: Goal: General experience of comfort will improve Outcome: Progressing

## 2020-09-23 NOTE — Progress Notes (Signed)
   Subjective:   Thomas [name] continues to be drowsy today but states he feels better. He denies any acute complaints. He is in agreement with tentatively plan to d/c to SNF tomorrow. He would like extra medication for the ambulance ride there.   Objective:  Vital signs in last 24 hours: Vitals:   09/22/20 1644 09/22/20 2021 09/23/20 0413 09/23/20 0940  BP: (!) 158/95 120/68 (!) 135/94 (!) 148/95  Pulse: 67 69 (!) 57 62  Resp: 18 18 16 18   Temp: 98.2 F (36.8 C) 98 F (36.7 C) 98.1 F (36.7 C) 98.4 F (36.9 C)  TempSrc: Oral Oral Oral Oral  SpO2: 96% 96% 95% 96%  Weight:      Height:       Physical Exam Vitals and nursing note reviewed.  Constitutional:      General: He is not in acute distress.    Appearance: He is obese.  Pulmonary:     Effort: Pulmonary effort is normal. No respiratory distress.  Skin:    General: Skin is warm.  Neurological:     General: No focal deficit present.     Mental Status: He is alert and oriented to person, place, and time.  Psychiatric:        Mood and Affect: Mood normal.        Behavior: Behavior normal.    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 PICC line in place for long-term ABX due to heavy history of MRSA. Unfortunately, Daptomycin and Ceftaroline are cost prohibitive for his acceptance to [facility].   - Appreciate ID, pharmacy and neurosurgery recs - Continue Vancomycin (to trough 15-20), Ceftriaxone, and PO Rifampin  until 10/30/20 via PICC with close follow up in ID clinic in 12 days with Dr. West Bali for lab monitoring; SW will check to be sure this is covered by [facility] on Monday.  - Continue Oxycontin 30mg  twice daily given improvement in pain  - Continue Oxycodone 5mg  q8 hours  - Continue to hold IV Dilaudid anticipating discharge soon - PT/OT on board  # Hypertension Improving today.   - Continue Metoprolol 100mg  twice daily - Continue hydralazine to 100mg  three times daily - Continue home amlodipine 10mg  daily   # AKI on CKD Stage IV Renal function returned to baseline   - Continue to trend biweekly BMP's on ABX especially given Vancomycin renal injury in past.  # Opioid-Induced Constipation, Resolved - Continue Senokot S, Miralax, Movantik 25mg  daily - Continue Lactulose twice daily - SMOG enema PRN   # Hyperkalemia, Resolved - Continue monitoring biweekly BMP  Prior to Admission Living Arrangement: Home Anticipated Discharge Location: SNF Barriers to Discharge: SNF Placement  Dispo: Anticipated discharge tomorrow  Jose Persia, MD 09/23/2020, 1:43 PM Pager: [phone] After 5pm on weekdays and 1pm on weekends: On Call pager [phone]

## 2020-09-23 NOTE — Plan of Care (Signed)

## 2020-09-24 LAB — CBC
HCT: 28.2 % — ABNORMAL LOW (ref 39.0–52.0)
Hemoglobin: 8.6 g/dL — ABNORMAL LOW (ref 13.0–17.0)
MCH: 31 pg (ref 26.0–34.0)
MCHC: 30.5 g/dL (ref 30.0–36.0)
MCV: 101.8 fL — ABNORMAL HIGH (ref 80.0–100.0)
Platelets: 219 10*3/uL (ref 150–400)
RBC: 2.77 MIL/uL — ABNORMAL LOW (ref 4.22–5.81)
RDW: 16.6 % — ABNORMAL HIGH (ref 11.5–15.5)
WBC: 7.9 10*3/uL (ref 4.0–10.5)
nRBC: 0 % (ref 0.0–0.2)

## 2020-09-24 LAB — GLUCOSE, CAPILLARY: Glucose-Capillary: 89 mg/dL (ref 70–99)

## 2020-09-24 LAB — SARS CORONAVIRUS 2 BY RT PCR (HOSPITAL ORDER, PERFORMED IN ~~LOC~~ HOSPITAL LAB): SARS Coronavirus 2: NEGATIVE

## 2020-09-24 LAB — HEPATIC FUNCTION PANEL
ALT: 13 U/L (ref 0–44)
AST: 18 U/L (ref 15–41)
Albumin: 2.7 g/dL — ABNORMAL LOW (ref 3.5–5.0)
Alkaline Phosphatase: 62 U/L (ref 38–126)
Bilirubin, Direct: <0.1 mg/dL (ref 0.0–0.2)
Total Bilirubin: 0.7 mg/dL (ref 0.3–1.2)
Total Protein: 6.5 g/dL (ref 6.5–8.1)

## 2020-09-24 LAB — MAGNESIUM: Magnesium: 1.6 mg/dL — ABNORMAL LOW (ref 1.7–2.4)

## 2020-09-24 LAB — VANCOMYCIN, TROUGH: Vancomycin Tr: 18 ug/mL (ref 15–20)

## 2020-09-24 LAB — PHOSPHORUS: Phosphorus: 4.4 mg/dL (ref 2.5–4.6)

## 2020-09-24 MED ORDER — LACTULOSE 10 GM/15ML PO SOLN
10.0000 g | Freq: Two times a day (BID) | ORAL | 0 refills | Status: DC
Start: 2020-09-24 — End: 2021-10-22

## 2020-09-24 MED ORDER — HYDROMORPHONE HCL 1 MG/ML IJ SOLN: 1.0000 mg | Freq: Once | INTRAMUSCULAR | Status: DC | PRN

## 2020-09-24 MED ORDER — CEFTRIAXONE IV (FOR PTA / DISCHARGE USE ONLY): 2.0000 g | INTRAVENOUS | 0 refills | Status: AC

## 2020-09-24 MED ORDER — METOPROLOL TARTRATE 100 MG PO TABS
100.0000 mg | ORAL_TABLET | Freq: Two times a day (BID) | ORAL | 0 refills | Status: DC
Start: 2020-09-24 — End: 2021-09-21

## 2020-09-24 MED ORDER — TAMSULOSIN HCL 0.4 MG PO CAPS: 0.4000 mg | ORAL_CAPSULE | Freq: Every day | ORAL | 0 refills | Status: AC

## 2020-09-24 MED ORDER — ATORVASTATIN CALCIUM 80 MG PO TABS
80.0000 mg | ORAL_TABLET | Freq: Every day | ORAL | 0 refills | Status: DC
Start: 2020-09-25 — End: 2022-01-03

## 2020-09-24 MED ORDER — METHOCARBAMOL 750 MG PO TABS: 750.0000 mg | ORAL_TABLET | Freq: Three times a day (TID) | ORAL | 0 refills | Status: DC

## 2020-09-24 MED ORDER — VITAMIN D3 25 MCG PO TABS
1000.0000 [IU] | ORAL_TABLET | Freq: Every day | ORAL | 0 refills | Status: DC
Start: 2020-09-25 — End: 2021-06-02

## 2020-09-24 MED ORDER — VANCOMYCIN IV (FOR PTA / DISCHARGE USE ONLY): 1250.0000 mg | INTRAVENOUS | 0 refills | Status: DC

## 2020-09-24 MED ORDER — POLYETHYLENE GLYCOL 3350 17 G PO PACK
17.0000 g | PACK | Freq: Two times a day (BID) | ORAL | 0 refills | Status: DC
Start: 2020-09-24 — End: 2021-06-06

## 2020-09-24 MED ORDER — HYDRALAZINE HCL 100 MG PO TABS
100.0000 mg | ORAL_TABLET | Freq: Three times a day (TID) | ORAL | 0 refills | Status: DC
Start: 2020-09-24 — End: 2021-10-01

## 2020-09-24 MED ORDER — NALOXEGOL OXALATE 25 MG PO TABS
25.0000 mg | ORAL_TABLET | Freq: Every day | ORAL | 0 refills | Status: DC
Start: 2020-09-25 — End: 2021-10-01

## 2020-09-24 MED ORDER — OXYCODONE HCL 5 MG PO TABS: 5.0000 mg | ORAL_TABLET | Freq: Three times a day (TID) | ORAL | 0 refills | Status: DC

## 2020-09-24 MED ORDER — POLYSACCHARIDE IRON COMPLEX 150 MG PO CAPS
150.0000 mg | ORAL_CAPSULE | Freq: Every day | ORAL | Status: DC
  Administered 2020-09-24 – 2020-10-03 (×10): 150 mg via ORAL
  Filled 2020-09-24 (×10): qty 1

## 2020-09-24 MED ORDER — GABAPENTIN 300 MG PO CAPS
300.0000 mg | ORAL_CAPSULE | Freq: Three times a day (TID) | ORAL | 0 refills | Status: DC
Start: 2020-09-24 — End: 2021-06-06

## 2020-09-24 MED ORDER — RIFAMPIN 300 MG PO CAPS: 300.0000 mg | ORAL_CAPSULE | Freq: Two times a day (BID) | ORAL | 0 refills | Status: AC

## 2020-09-24 MED ORDER — OXYCODONE HCL ER 30 MG PO T12A: 30.0000 mg | EXTENDED_RELEASE_TABLET | Freq: Two times a day (BID) | ORAL | 0 refills | Status: DC

## 2020-09-24 MED ORDER — SENNOSIDES-DOCUSATE SODIUM 8.6-50 MG PO TABS
2.0000 | ORAL_TABLET | Freq: Two times a day (BID) | ORAL | 0 refills | Status: DC
Start: 2020-09-24 — End: 2021-06-06

## 2020-09-24 MED ORDER — POLYSACCHARIDE IRON COMPLEX 150 MG PO CAPS
150.0000 mg | ORAL_CAPSULE | Freq: Every day | ORAL | 0 refills | Status: DC
Start: 2020-09-25 — End: 2021-06-02

## 2020-09-24 NOTE — Progress Notes (Addendum)
1830 Waiting for Vanco trough result for the right Vanco dose prior to discarge. I tried giving report to [facility] at [facility] but they refused to take the pt. Per Camera operator at [employer], they got an instruction from their [employer] not to accept the pt. I notified Dr Charleen Kirks. Social work Hassan Rowan is aware. Per Hassan Rowan, she tried calling the facility but no answer. Hassan Rowan advised me to cancel PTAR.

## 2020-09-24 NOTE — Discharge Summary (Addendum)
Name: Thomas [name] MRN: [id number] DOB: [birth year] 69 y.o. 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 Spinal Cord Compression 2. Acute on Chronic T10-T12 osteomyelitis / discitis  3. AKI on CKD Stage IV  4. Controlled Type II DM  5. Opioid Dependence 6. Drug-Induced Constipation 7. Severe Asymptomatic Hypertension 8. Chronic Macrocytic Anemia 9. Iron Deficiency  Discharge Medications: Allergies as of 10/03/2020      Reactions   Other Other (See Comments)   Blood pressure issues Per [facility] hospital: Pt states he can only take these pain meds or else he gets very sick: Oxycontin,morphine,demerol, and dilaudid are the only pain meds pt states he can take.    Oxymorphone Other (See Comments)   Causes kidney problems   Aspirin Nausea And Vomiting   Per [facility]   Beta Vulgaris Nausea And Vomiting   Beets   Buspirone Other (See Comments)   unknown   Cabbage Nausea And Vomiting   Codeine Other (See Comments)   unknown   Dolobid [diflunisal]    Unknown reaction   Fish Allergy Nausea And Vomiting   Fish-derived Products Nausea And Vomiting   Methadone    Unknown reaction   Pentazocine Other (See Comments)   Unknown reaction   Propoxyphene Other (See Comments)   Unknown reaction   Shellfish Allergy Nausea And Vomiting   Sulfa Antibiotics Hives   Sulfasalazine Other (See Comments)   Unknown reaction   Vistaril [hydroxyzine]    Unknown reaction    Amoxicillin Rash   Per [facility]      Medication List    STOP taking these medications   bisacodyl 10 MG suppository Commonly known as: DULCOLAX   cloNIDine 0.3 MG tablet Commonly known as: CATAPRES   Docusate Sodium 100 MG capsule   insulin NPH-regular Human (70-30) 100 UNIT/ML injection   OxyCONTIN 30 MG 12 hr tablet Generic drug: oxyCODONE Replaced by: oxyCODONE 5 MG immediate release tablet   tiZANidine 4 MG tablet Commonly known as: ZANAFLEX   Tradjenta 5 MG Tabs tablet Generic drug: linagliptin   Vitamin D (Ergocalciferol) 1.25 MG (50000 UNIT) Caps capsule Commonly known as: DRISDOL     TAKE these medications   acetaminophen 500 MG tablet Commonly known as: TYLENOL Take 2 tablets (1,000 mg total) by mouth every 8 (eight) hours.   allopurinol 100 MG tablet Commonly known as: ZYLOPRIM Take 100 mg by mouth daily.   amLODipine 10 MG tablet Commonly known as: NORVASC Take 1 tablet by mouth daily.   atorvastatin 80 MG tablet Commonly known as: LIPITOR Take 1 tablet (80 mg total) by mouth daily. What changed:   medication strength  how much to take   cefTRIAXone  IVPB Commonly known as: ROCEPHIN Inject 2 g into the vein daily. Indication:  Osteomyelitis/discitis First Dose: yes Last Day of Therapy:  10/31/2019 Labs - Once weekly:  CBC/D and BMP, Labs - Every other week:  ESR and CRP Method of administration: IV  Push Method of administration may be changed at the discretion of home infusion pharmacist based upon assessment of the patient and/or caregiver's ability to self-administer the medication ordered.   colchicine 0.6 MG tablet Take 0.6 mg by mouth daily as needed (gout flare).   finasteride 5 MG tablet Commonly known as: PROSCAR Take 1 tablet (5 mg total) by mouth daily.   furosemide 40 MG tablet Commonly known as: LASIX Take 40 mg by mouth daily as needed for fluid.   gabapentin 300 MG capsule Commonly known as: NEURONTIN Take 1  capsule (300 mg total) by mouth 3 (three) times daily.   gabapentin 300 MG capsule Commonly known as: NEURONTIN Take 2 capsules (600 mg total) by mouth 3 (three) times daily.   hydrALAZINE 100 MG tablet Commonly known as: APRESOLINE Take 1 tablet (100 mg total) by mouth 3 (three) times daily. What changed:   medication strength  how much to take   iron polysaccharides 150 MG capsule Commonly known as: NIFEREX Take 1 capsule (150 mg total) by mouth daily.   lactulose 10 GM/15ML solution Commonly known as: CHRONULAC Take 15 mLs (10 g total) by mouth 2 (two) times daily.   methocarbamol 750 MG tablet Commonly known as: ROBAXIN Take 1 tablet (750 mg total) by mouth 3 (three) times daily. What changed:   when to take this  reasons to take this   metoprolol tartrate 100 MG tablet Commonly known as: LOPRESSOR Take 1 tablet (100 mg total) by mouth 2 (two) times daily. What changed:   medication strength  how much to take   naloxegol oxalate 25 MG Tabs tablet Commonly known as: MOVANTIK Take 1 tablet (25 mg total) by mouth daily.   oxyCODONE 5 MG immediate release tablet Commonly known as: Oxy IR/ROXICODONE Take 1.5 tablets (7.5 mg total) by mouth every 8 (eight) hours. Replaces: OxyCONTIN 30 MG 12 hr tablet   oxyCODONE 30 MG 12 hr tablet Take 1 tablet (30 mg total) by mouth every 12 (twelve) hours.   polyethylene glycol 17 g packet Commonly known as: MIRALAX / GLYCOLAX Take 17 g by mouth 2 (two) times daily. What changed: when to take this   rifampin 300 MG capsule Commonly known as: Rifadin Take 1 capsule (300 mg total) by mouth 2 (two) times daily.   senna-docusate 8.6-50 MG tablet Commonly known as: Senokot-S Take 2 tablets by mouth 2 (two) times daily.   Synthroid 50 MCG tablet Generic drug: levothyroxine Take 50 mcg by mouth daily before breakfast.   tamsulosin 0.4 MG Caps capsule Commonly known as: FLOMAX Take 1 capsule (0.4 mg total) by mouth  daily. What changed: when to take this   vancomycin  IVPB Inject 750 mg into the vein daily. Indication:  Vertebral osteomyelitis  First Dose: Yes Last Day of Therapy:  10/30/2020 Labs - Sunday/Monday:  CBC/D, BMP, and vancomycin trough. Labs - Thursday:  BMP and vancomycin trough Labs - Every other week:  ESR and CRP Method of administration:Elastomeric Method of administration may be changed at the discretion of the patient and/or caregiver's ability to self-administer the medication ordered.   Vitamin B-12 5000 MCG Tbdp Take 250 mcg by mouth daily.   Vitamin D3 25 MCG tablet Commonly known as: Vitamin D Take 1 tablet (1,000 Units total) by mouth daily.   Zofran 4 MG tablet Generic drug: ondansetron Take 4 mg by mouth every 8 (eight) hours as needed for nausea or vomiting.  Home Infusion Instuctions  (From admission, onward)         Start     Ordered   09/24/20 0000  Home infusion instructions       Question:  Instructions  Answer:  Flushing of vascular access device: 0.9% NaCl pre/post medication administration and prn patency; Heparin 100 u/ml, 75m for implanted ports and Heparin 10u/ml, 548mfor all other central venous catheters.   09/24/20 1405          Disposition and follow-up:   [name]  At the hospital follow up visit please address:  1.   A. Acute on Chronic Back Pain s/p Spinal Cord Decompression, T10-T12 fusion - Be sure patient's pain is adequately controlled, although avoid acutely increasing opioids as patient was very somnolent on admission.   B. Chronic Osteomyelitis / Discitis - Continue Vancomycin, Ceftriaxone, and Rifampin through PICC line until 10/30/20 (8 weeks). Monitor for AKI given history of this on Vancomycin in the past. Patient will need to follow up with infectious disease to monitor for improvement (see notes). Patient will need follow up with  infectious disease.  C. Type II DM - Per chart review, patient had been on insulins for DM in the past; however, blood sugars were well controlled even with diet in the hospital. Consider changing medications as indicated.  D. Uncontrolled Hypertension - Patient's blood pressure remains elevated despite increasing home metoprolol to 10014mwice daily and hydralazine to 100m94mree times daily with home amlodipine 10mg46mtient would benefit from tighter blood pressure control.   E. Chronic Anemia and Iron Deficiency - MCV intermittently elevated. Folate normal/elevated in 2020. Found to have mild iron deficiency on admission. Repeat iron studies ~ 3 months given we started him on oral iron.  2.  Labs / imaging needed at time of follow-up: BMP, consider CBC, B12, folate    3.  Pending labs/ test needing follow-up: None   Follow-up Appointments:  Follow-up Information    Dawley, Troy C, DO Follow up in 2 week(s).   Why: call for appointment, need to be seen one month after surgery Contact information: [id number] [phone]              Hospital Course by problem list: 1. Spinal Cord Compression: During admission at outside hospital, patient suffered spinal injury. After being discharged home from OSH, [facility] as patient was unable to care for himself at home. [facility] and spinal cord decompression were performed. During hospitalization, pain control was difficult given he is on chronic opioid medications. He was eventually stabilized on Oxycontin 30mg 75me daily and Oxycodone 7.5mg q423murs; IV Dilaudid discontinued. He continued to endorse severe pain on examination but was somnolent and sleeping a good portion of the day per nursing staff. No complications occurred s/p surgery.  2. Chronic discitis, osteomyelitis T10-T12  s/p Fusion: Patient has had multiple previous hospitalizations for spinal osteomyelitis. Aspirate culture collected during surgery without growth. Per Dr. Dawley,Reatha Armour was visible evidence of active infection in the spine. Patient started on broad spectrum antibiotics for 8 weeks. He will take vamcomycin, ceftriaxone, and rifampin until 10/30/20 via PICC line that he has in place.  3. Opioid-induced constipation: Given patient's increased opioid requirement during hospitalization, he subsequently developed worsening constipation. Patient required  multiple laxatives, including Senokot-S, Miralax, Colace, and lactulose. Patient was started on Movantik 57m qd (increased dosage due to interaction with rifampin). Unsuccessful attempts by milk and molasses enema, milk of magnesia, and free water enema. Patient was able to have large bowel movement after SMOG enema and endorses lactulose seems to make the greatest difference. Bowel movements became regular (daily) over a week prior to discharge.   4. Severe Asymptomatic Hypertension: Patient's blood pressure was significantly elevated this admission, although patient denied associated symptoms. Only mildly improved with pain control and remained elevated despite increases in home Metoprolol 525mBID to 10064mID, Hydralazine 60m87m 100mg36m, and continuation of amlodipine 10mg.9m improve significantly towards the end of admission on above regimen.   5. Chronic Anemia: Patient intermittently has macrocytosis, however was found to have mild iron deficiency and was started on oral iron polysaccharide pills just prior to discharge. Anemia remained stable.   Discharge Vitals:   BP 139/72   Pulse 76   Temp (!) 97.4 F (36.3 C) (Oral)   Resp 17   Ht '6\' 7"'  (2.007 m)   Wt 99.8 kg   SpO2 100%   BMI 24.79 kg/m   Pertinent Labs, Studies, and Procedures:   CT C/T/L Spine 08/29/20: 1. No acute cervical spine fracture. 2. 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 3. Partially visualized ground-glass airspace opacities in the upper lobes. This may indicate an underlying atypical infectious process.  MR Thoracic Spine 08/30/20: 1. Chronic discitis/osteomyelitis at T10-11 with active but improved inflammation and disc space fluid. Superimposed degenerative changes have progressed from February 2021, with disc protrusion and ligament thickening causing cord compression with cord edema. Question instability at this level. 2. No definite epidural collection.  The patient declined contrast.  MR Lumbar Spine 08/30/20:  1. Chronic discitis/osteomyelitis at T10-11 with active but improved inflammation and disc space fluid. Superimposed degenerative changes have progressed from February 2021, with disc protrusion and ligament thickening causing cord compression with cord edema. Question instability at this level. 2. No definite epidural collection.  The patient declined contrast.  Procedure 09/04/20: 1. T10-12 posterolateral arthrodesis 2. T10-12 nonsegmental pedicle srew instrumentation, K2M Everest system 3. T10, 11 bilateral laminectomy and left T10-11 facetectomy for decompression of spinal cord and left T10 nerve root 4. Intraoperative use of microscope for decompression of neural elements 5. Intraoperative use of allograft 6. Intraoperative use of neuromonitoring, SSEP >1 hour  Discharge Instructions: Discharge Instructions    Call MD for:  difficulty breathing, headache or visual disturbances   Complete by: As directed    Call MD for:  extreme fatigue   Complete by: As directed    Call MD for:  persistant dizziness or light-headedness   Complete by: As directed    Call MD for:  persistant nausea and vomiting   Complete by: As directed    Call MD for:  redness, tenderness, or signs of infection (pain, swelling, redness, odor or green/yellow discharge around incision  site)   Complete by: As directed    Call MD for:  severe uncontrolled pain   Complete by: As directed    Call MD for:  temperature >100.4   Complete by: As directed    Diet - low sodium heart healthy   Complete by: As directed    Discharge instructions   Complete by: As directed    Mr. GranthRaven, [facility]  [facility] that led to spinal cord compression.  Treatment required surgery.It was also discovered that there is a chronic bone infection in your spinal area called osteomyelitis. For this, you are to complete 8 weeks of antibiotics through an IV. The antibiotics you are currently on are called Ceftriaxone, Vancomycin and Rifampin. You will follow up with the Infectious Disease team to manage this.   While here, we also adjusted your blood pressure medication. You are to continue taking Metoprolol twice a day, Hydralazine three times a day and Amlodipine once a day.   Your current pain regimen includes: Oxycontin twice daily, Oxycodone three times a day, Methocarbamol (Robaxin, a muscle relaxer) three times a day and Gabapentin three times a day.    We stopped your diabetes medication, including insulin and Tradjenta, as your blood sugar has been well controlled at this time.    At this time, you are headed to a rehabilitation center to work on your strength.   It was a pleasure meeting you and thank you for allowing [place] to participate in your care. We wish you the best!   - Dr. Charleen Kirks   Home infusion instructions   Complete by: As directed    Instructions: Flushing of vascular access device: 0.9% NaCl pre/post medication administration and prn patency; Heparin 100 u/ml, 70m for implanted ports and Heparin 10u/ml, 551mfor all other central venous catheters.   Incentive spirometry RT   Complete by: As directed    Increase activity slowly   Complete by: As directed    No wound care   Complete by: As directed    No wound care   Complete by: As  directed       Signed: SpJeralyn BennettMD 10/03/2020, 12:40 PM   Pager: [phone]

## 2020-09-24 NOTE — Progress Notes (Signed)
Pharmacy Antibiotic Note  Thomas [name] is a 69 y.o. male admitted on 08/29/2020 with acute on chronic vertebral infection. Pharmacy has been consulted for vancomycin dosing. Unfortunately, SNF will not take patient on Daptomycin or Ceftaroline though he has had issues with AKI in the past on Vancomycin.   Scr with slight bump since starting vanc. Current CrCl ~ 50 ml/min. Dosed conservatively.   12/6 1500 VT pending. Patient is discharging today. Pharmacy will follow up level and call SNF to adjust dose if needed.  Plan: Vancomycin 1250 mg every 24 hours  F/u VT and adjust dose as needed Monitor renal function  Height: 6\' 7"  (200.7 cm) Weight: 95.9 kg (211 lb 6.7 oz) IBW/kg (Calculated) : 93.7  Temp (24hrs), Avg:98.4 F (36.9 C), Min:97.9 F (36.6 C), Max:98.8 F (37.1 C)  Recent Labs  Lab 09/18/20 0933 09/20/20 0245 09/22/20 0325 09/23/20 0358 09/24/20 0415  WBC  --   --   --   --  7.9  CREATININE 1.52* 1.66* 1.84* 1.87*  --     Estimated Creatinine Clearance: 50.8 mL/min (A) (by C-G formula based on SCr of 1.87 mg/dL (H)).    Allergies  Allergen Reactions  . Other Other (See Comments)    Blood pressure issues Per [facility] hospital: Pt states he can only take these pain meds or else he gets very sick: Oxycontin,morphine,demerol, and dilaudid are the only pain meds pt states he can take.    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 2.4.2021: He also reports that he currently take Oxycontin.   Marland Kitchen Oxymorphone Other (See Comments)    Causes kidney problems  . Aspirin Nausea And Vomiting    Per [facility]  . Beta Vulgaris Nausea And Vomiting    Beets  . Buspirone Other (See Comments)    unknown  . Cabbage Nausea And Vomiting  . Codeine Other (See Comments)    unknown  . Dolobid [Diflunisal]     Unknown reaction  . Fish Allergy Nausea And Vomiting  . Fish-Derived Products Nausea And  Vomiting  . Methadone     Unknown reaction  . Pentazocine Other (See Comments)    Unknown reaction  . Propoxyphene Other (See Comments)    Unknown reaction  . Shellfish Allergy Nausea And Vomiting  . Sulfa Antibiotics Hives  . Sulfasalazine Other (See Comments)    Unknown reaction  . Vistaril [Hydroxyzine]     Unknown reaction  . Amoxicillin Rash    Per [facility]   Antibiotics: Vanc 11/16 >>11/17 Cubicin 11/17>> (12/2) CTX 11/17 >> 12/2, 12/3>>>(10/30/20) Vanc 12/3>> (10/30/20) Rifampin 11/18 >> (10/30/20)  11/18 CK = 33 11/29 CK = 48  Microbiology 11/10 BCx - negative 11/11 UCx - negative 11/15 surgical PCR - positive MRSA/MSSA 11/16 T10-11 tissue - negative   Thank you for allowing pharmacy to be a part of this patient's care.  Benetta Spar, PharmD, BCPS, BCCP Clinical Pharmacist  Please check AMION for all [facility] phone numbers After 10:00 PM, call [place] [phone]

## 2020-09-24 NOTE — TOC Transition Note (Addendum)
Transition of Care [facility]) - CM/SW Discharge Note   Patient Details  Name: Thomas [name] MRN: [id number] Date of Birth: Sep 12, 1951  Transition of Care [facility]) CM/SW Contact:  Curlene Labrum, RN Phone Number: 09/24/2020, 3:21 PM   Clinical Narrative:    Case management spoke with [facility], [place] and they are accepting the patient for admission today.  The discharge summary and discharge clinicals were placed in the hub for the facility.    The patient is having a Vancomycin trough drawn this afternoon by the IV team and the PICC line will be locked by the IV team prior to transport.  I spoke with the patient and bother, BJ and they were notified of the patient's transfer to the facility at 5 pm today.  [facility] was called and the patient was scheduled for [facility] transfer at 5 pm today.  Marissa, RN is going to call report to the facility at [phone].  Will continue to follow the patient for discharge to the facility.   Final next level of care: [place] Barriers to Discharge: Continued Medical Work up   Patient Goals and CMS Choice Patient states their goals for this hospitalization and ongoing recovery are:: Patient plans to discharge to a skilled nursing facility.  Refuses placement back to [facility]. CMS Medicare.gov Compare Post Acute Care list provided to:: Patient Choice offered to / list presented to : Patient  Discharge Placement                       Discharge Plan and Services   Discharge Planning Services: CM Consult Post Acute Care Choice: [place]                               Social Determinants of Health (SDOH) Interventions     Readmission Risk Interventions Readmission Risk Prevention Plan 09/17/2019  Transportation Screening Complete  PCP or Specialist Appt within 5-7 Days Not Complete  Not Complete comments snf vs home  Home Care Screening Complete  Medication Review (RN CM) Complete   Some recent data might be hidden

## 2020-09-24 NOTE — Progress Notes (Addendum)
6:48 pm -Received call from Attending MD that the SNF is not accepting the patient at this time. Talked to the charge nurse of [facility], and was informed that they received instructions from their Director Salli Real that they will not accept the patient. I asked why, she could not tell me the reason. Cell number left for Director to contact me. Also called Claiborne Billings Deal CM with [facility], voice message left.  At this time unable to talk to anyone to understand the reason why they are not accepting the patient. Talked to bedside nurse to cancel [facility] transportation.   7:34pm - Received call from [employer], the SNF cannot accept the patient because they cannot care for the patient;. Claiborne Billings stated that she tried to call the [facility] but no answer due to after 5 pm.  B Pennie Rushing Transition of Care Supervisor [phone]

## 2020-09-24 NOTE — Progress Notes (Signed)
Pharmacy Antibiotic Note  Thomas [name] is a 69 y.o. male admitted on 08/29/2020 with acute on chronic vertebral infection. Pharmacy has been consulted for vancomycin dosing. Pt to be discharged to SNF today; unfortunately, SNF will not take patient on daptomycin or ceftaroline, though he has had issues with AKI in the past on vancomycin.   Scr with slight bump since starting vancomycin. Current CrCl ~ 50 ml/min. Vancomycin is dosed conservatively.   Vancomycin trough level drawn this afternoon (~24 hrs after last dose of vancomycin 1250 mg IV Q 24 hrs regimen) was 18 mg/L, which is within the goal range for this pt.   Plan: Continue vancomycin 1250 mg every 24 hours  Monitor renal function, vancomycin levels (goal trough: 15-20 mg/L)  Height: 6\' 7"  (200.7 cm) Weight: 95.9 kg (211 lb 6.7 oz) IBW/kg (Calculated) : 93.7  Temp (24hrs), Avg:98.6 F (37 C), Min:98.4 F (36.9 C), Max:98.8 F (37.1 C)  Recent Labs  Lab 09/18/20 0933 09/20/20 0245 09/22/20 0325 09/23/20 0358 09/24/20 0415  WBC  --   --   --   --  7.9  CREATININE 1.52* 1.66* 1.84* 1.87*  --     Estimated Creatinine Clearance: 50.8 mL/min (A) (by C-G formula based on SCr of 1.87 mg/dL (H)).    Allergies  Allergen Reactions  . Other Other (See Comments)    Blood pressure issues Per [facility] hospital: Pt states he can only take these pain meds or else he gets very sick: Oxycontin,morphine,demerol, and dilaudid are the only pain meds pt states he can take.    Marland Kitchen Oxymorphone Other (See Comments)    Causes kidney problems  . Aspirin Nausea And Vomiting    Per [facility]  . Beta Vulgaris Nausea And Vomiting    Beets  . Buspirone Other (See Comments)    unknown  . Cabbage Nausea And Vomiting  . Codeine Other (See Comments)    unknown  . Dolobid [Diflunisal]     Unknown reaction  . Fish Allergy Nausea And Vomiting  . Fish-Derived Products Nausea And Vomiting  . Methadone     Unknown reaction  .  Pentazocine Other (See Comments)    Unknown reaction  . Propoxyphene Other (See Comments)    Unknown reaction  . Shellfish Allergy Nausea And Vomiting  . Sulfa Antibiotics Hives  . Sulfasalazine Other (See Comments)    Unknown reaction  . Vistaril [Hydroxyzine]     Unknown reaction  . Amoxicillin Rash    Per [facility]   Antibiotics: Vanc 11/16 >>11/17 Cubicin 11/17>> (12/2) CTX 11/17 >> 12/2, 12/3>>>(10/30/20) Vanc 12/3>> (10/30/20) Rifampin 11/18 >> (10/30/20)  11/18 CK = 33 11/29 CK = 48  Microbiology 11/10 BCx - negative 11/11 UCx - negative 11/15 surgical PCR - positive MRSA/MSSA 11/16 T10-11 tissue - negative  Thank you for allowing pharmacy to be a part of this patient's care.  Gillermina Hu, PharmD, BCPS, [facility] Clinical Pharmacist 09/24/20, 18:40 PM

## 2020-09-24 NOTE — Progress Notes (Signed)
Physical Therapy Treatment Patient Details Name: Thomas [name] MRN: [id number] DOB: Feb 20, 1951 Today's Date: 09/24/2020    History of Present Illness 69 year old person living with a right below-knee amputation, chronic pain syndrome due to a complicated history of thoracic spine infections and degenerative disease, deconditioning after recent Covid infection and comorbidities admitted to our service for medical managements of another episode of chronic thoracic osteomyelitis and discitis with degenerative changes causing spinal cord compression. On 11/16, pt underwent surgical decompression around T11, fusion, and disc aspiration for microbiology    PT Comments    Pt supine on arrival, agreeable to limited supine/sidelying exercises and reports moderate to severe LBP and fear of mobility as reason to defer EOB, RN notified but no other PRN pain meds due at this time. Pt performed rolling modI and BLE AROM therapeutic exercises as detailed below with good tolerance. Pt instructed on back precautions but not receptive to instruction and refusing EOB despite importance of positional change/pressure offloading. Pt continues to benefit from PT services to progress toward functional mobility goals. D/C recs below remain appropriate.   Follow Up Recommendations  SNF     Equipment Recommendations  None recommended by PT    Recommendations for Other Services       Precautions / Restrictions Precautions Precautions: Fall;Back Precaution Booklet Issued: Yes (comment) Precaution Comments: reviewed back precs, handout given previously Required Braces or Orthoses: Spinal Brace Spinal Brace: Thoracolumbosacral orthotic Spinal Brace Comments: TLSO when OOB Restrictions Weight Bearing Restrictions: No Other Position/Activity Restrictions: hx of R BKA, back precautions    Mobility  Bed Mobility Overal bed mobility: Modified Independent Bed Mobility: Rolling Rolling: Modified independent  (Device/Increase time)         General bed mobility comments: patient declines sitting up on side of bed due to pain in back, but rolls independently  Transfers                    Ambulation/Gait                 Stairs             Wheelchair Mobility    Modified Rankin (Stroke Patients Only)       Balance                                            Cognition Arousal/Alertness: Awake/alert Behavior During Therapy: WFL for tasks assessed/performed Overall Cognitive Status: No family/caregiver present to determine baseline cognitive functioning Area of Impairment: Problem solving;Safety/judgement                 Orientation Level: Disoriented to;Time   Memory: Decreased short-term memory;Decreased recall of precautions Following Commands: Follows one step commands with increased time Safety/Judgement: Decreased awareness of safety;Decreased awareness of deficits   Problem Solving: Requires verbal cues General Comments: pt unable to recall back precautions, given reinforcement; pt says "I won't be able to do that." but was compliant during session      Exercises General Exercises - Lower Extremity Ankle Circles/Pumps: AROM;Left;10 reps;Supine Heel Slides: Left;10 reps;Supine;AROM Hip ABduction/ADduction: AROM;Strengthening;Both;15 reps;Supine;Sidelying (sidelying on RLE and supine on LLE) Straight Leg Raises: AROM;Strengthening;Both;10 reps;Supine Other Exercises Other Exercises: sidelying RLE AROM: hip abduction, hip extension, knee flex/ext 1x10 reps    General Comments General comments (skin integrity, edema, etc.): pt has prosthetic but reports it is too loose  after significant weight loss      Pertinent Vitals/Pain Pain Assessment: Faces Faces Pain Scale: Hurts little more Pain Location: mid back Pain Descriptors / Indicators: Spasm;Sharp;Grimacing;Guarding Pain Intervention(s): Monitored during  session;Premedicated before session;Repositioned    Home Living                      Prior Function            PT Goals (current goals can now be found in the care plan section) Acute Rehab PT Goals Patient Stated Goal: pain control, get stronger and get home PT Goal Formulation: With patient Time For Goal Achievement: 09/27/20 Potential to Achieve Goals: Fair (depending on participation) Progress towards PT goals: Progressing toward goals (slow/limited progress due to unwillingness to EOB t/f)    Frequency    Min 4X/week      PT Plan Current plan remains appropriate    Co-evaluation              AM-PAC PT "6 Clicks" Mobility   Outcome Measure  Help needed turning from your back to your side while in a flat bed without using bedrails?: None Help needed moving from lying on your back to sitting on the side of a flat bed without using bedrails?: None Help needed moving to and from a bed to a chair (including a wheelchair)?: A Little Help needed standing up from a chair using your arms (e.g., wheelchair or bedside chair)?: Total Help needed to walk in hospital room?: Total Help needed climbing 3-5 steps with a railing? : Total 6 Click Score: 14    End of Session Equipment Utilized During Treatment: Back brace Activity Tolerance: Patient limited by pain Patient left: in bed;with call bell/phone within reach;with bed alarm set Nurse Communication: Mobility status PT Visit Diagnosis: Muscle weakness (generalized) (M62.81);Other abnormalities of gait and mobility (R26.89);Pain Pain - part of body:  (back)     Time: 7035-0093 PT Time Calculation (min) (ACUTE ONLY): 16 min  Charges:  $Therapeutic Exercise: 8-22 mins                     Jenissa Tyrell P., PTA Acute Rehabilitation Services Pager: [phone] Office: [place] 09/24/2020, 2:26 PM

## 2020-09-24 NOTE — Discharge Summary (Incomplete Revision)
Name: Thomas [name] MRN: [id number] DOB: [birth year] 69 y.o. 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 Spinal Cord Compression 2. Acute on Chronic T10-T12 osteomyelitis / discitis  3. AKI on CKD Stage IV  4. Controlled Type II DM  5. Opioid Dependence 6. Drug-Induced Constipation 7. Severe Asymptomatic Hypertension 8. Chronic Macrocytic Anemia 9. Iron Deficiency  Discharge Medications: Allergies as of 09/25/2020      Reactions   Other Other (See Comments)   Blood pressure issues Per [facility] hospital: Pt states he can only take these pain meds or else he gets very sick: Oxycontin,morphine,demerol, and dilaudid are the only pain meds pt states he can take.    Oxymorphone Other (See Comments)   Causes kidney problems   Aspirin Nausea And Vomiting   Per [facility]   Beta Vulgaris Nausea And Vomiting   Beets   Buspirone Other (See Comments)   unknown   Cabbage Nausea And Vomiting   Codeine Other (See Comments)   unknown   Dolobid [diflunisal]    Unknown reaction   Fish Allergy Nausea And Vomiting   Fish-derived Products Nausea And Vomiting   Methadone    Unknown reaction   Pentazocine Other (See Comments)   Unknown reaction   Propoxyphene Other (See Comments)   Unknown reaction   Shellfish Allergy Nausea And Vomiting   Sulfa Antibiotics Hives   Sulfasalazine Other (See Comments)   Unknown reaction   Vistaril [hydroxyzine]    Unknown reaction   Amoxicillin Rash   Per  [facility]      Medication List    STOP taking these medications   bisacodyl 10 MG suppository Commonly known as: DULCOLAX   cloNIDine 0.3 MG tablet Commonly known as: CATAPRES   Docusate Sodium 100 MG capsule   insulin NPH-regular Human (70-30) 100 UNIT/ML injection   tiZANidine 4 MG tablet Commonly known as: ZANAFLEX   Tradjenta 5 MG Tabs tablet Generic drug: linagliptin   Vitamin D (Ergocalciferol) 1.25 MG (50000 UNIT) Caps capsule Commonly known as: DRISDOL     TAKE these medications   allopurinol 100 MG tablet Commonly known as: ZYLOPRIM Take 100 mg by mouth daily.   amLODipine 10 MG tablet Commonly known as: NORVASC Take 1 tablet by mouth daily.   atorvastatin 80 MG tablet Commonly known as: LIPITOR Take 1 tablet (80 mg total) by mouth daily. What changed:   medication strength  how much to take   cefTRIAXone  IVPB Commonly known as: ROCEPHIN Inject 2 g into the vein daily. Indication:  Osteomyelitis/discitis First Dose: yes Last Day of Therapy:  10/31/2019 Labs - Once weekly:  CBC/D and BMP, Labs - Every other week:  ESR and CRP Method of administration: IV Push Method of administration may be changed at the discretion of home infusion pharmacist based upon assessment of the patient and/or caregiver's ability to self-administer the medication ordered.   colchicine 0.6 MG tablet Take 0.6 mg by mouth daily as needed (gout flare).  finasteride 5 MG tablet Commonly known as: PROSCAR Take 1 tablet (5 mg total) by mouth daily.   furosemide 40 MG tablet Commonly known as: LASIX Take 40 mg by mouth daily as needed for fluid.   gabapentin 300 MG capsule Commonly known as: NEURONTIN Take 1 capsule (300 mg total) by mouth 3 (three) times daily.   hydrALAZINE 100 MG tablet Commonly known as: APRESOLINE Take 1 tablet (100 mg total) by mouth 3 (three) times daily. What changed:   medication strength  how much to take   iron polysaccharides  150 MG capsule Commonly known as: NIFEREX Take 1 capsule (150 mg total) by mouth daily.   lactulose 10 GM/15ML solution Commonly known as: CHRONULAC Take 15 mLs (10 g total) by mouth 2 (two) times daily.   methocarbamol 750 MG tablet Commonly known as: ROBAXIN Take 1 tablet (750 mg total) by mouth 3 (three) times daily. What changed:   when to take this  reasons to take this   metoprolol tartrate 100 MG tablet Commonly known as: LOPRESSOR Take 1 tablet (100 mg total) by mouth 2 (two) times daily. What changed:   medication strength  how much to take   naloxegol oxalate 25 MG Tabs tablet Commonly known as: MOVANTIK Take 1 tablet (25 mg total) by mouth daily.   oxyCODONE 30 MG 12 hr tablet Take 1 tablet (30 mg total) by mouth every 12 (twelve) hours. What changed: when to take this   oxyCODONE 5 MG immediate release tablet Commonly known as: Oxy IR/ROXICODONE Take 1.5 tablets (7.5 mg total) by mouth every 8 (eight) hours. What changed: You were already taking a medication with the same name, and this prescription was added. Make sure you understand how and when to take each.   polyethylene glycol 17 g packet Commonly known as: MIRALAX / GLYCOLAX Take 17 g by mouth 2 (two) times daily. What changed: when to take this   rifampin 300 MG capsule Commonly known as: Rifadin Take 1 capsule (300 mg total) by mouth 2 (two) times daily.   senna-docusate 8.6-50 MG tablet Commonly known as: Senokot-S Take 2 tablets by mouth 2 (two) times daily.   Synthroid 50 MCG tablet Generic drug: levothyroxine Take 50 mcg by mouth daily before breakfast.   tamsulosin 0.4 MG Caps capsule Commonly known as: FLOMAX Take 1 capsule (0.4 mg total) by mouth daily. What changed: when to take this   vancomycin  IVPB Inject 1,250 mg into the vein daily. Indication:  Vertebral osteomyelitis  First Dose: Yes Last Day of Therapy:  10/30/2020 Labs - Sunday/Monday:  CBC/D, BMP, and vancomycin  trough. Labs - Thursday:  BMP and vancomycin trough Labs - Every other week:  ESR and CRP Method of administration:Elastomeric Method of administration may be changed at the discretion of the patient and/or caregiver's ability to self-administer the medication ordered.   Vitamin B-12 5000 MCG Tbdp Take 250 mcg by mouth daily.   Vitamin D3 25 MCG tablet Commonly known as: Vitamin D Take 1 tablet (1,000 Units total) by mouth daily.   Zofran 4 MG tablet Generic drug: ondansetron Take 4 mg by mouth every 8 (eight) hours as needed for nausea or vomiting.            Home Infusion Instuctions  (From admission, onward)         Start     Ordered   09/24/20 0000  Home infusion instructions       Question:  Instructions  Answer:  Flushing of vascular access device: 0.9% NaCl pre/post medication administration and prn patency; Heparin 100 u/ml, 76m for implanted ports and Heparin 10u/ml, 583mfor all other central venous catheters.   09/24/20 1405          Disposition and follow-up:   [facility]  At the hospital follow up visit please address:  1.   A. Acute on Chronic Back Pain s/p Spinal Cord Decompression, T10-T12 fusion - Be sure patient's pain is adequately controlled, although avoid acutely increasing opioids as patient was very somnolent on admission.   B. Chronic Osteomyelitis / Discitis - Continue Vancomycin, Ceftriaxone, and Rifampin through PICC line until 10/30/20 (8 weeks). Monitor for AKI given history of this on Vancomycin in the past. Patient will need to follow up with infectious disease to monitor for improvement (see notes).  C. Type II DM - Per chart review, patient had been on insulins for DM in the past; however, blood sugars were well controlled even with diet in the hospital. Consider changing medications as indicated.  D. Uncontrolled Hypertension - Patient's blood pressure remains  elevated despite increasing home metoprolol to 10052mwice daily and hydralazine to 100m38mree times daily with home amlodipine 10mg52mtient would benefit from tighter blood pressure control.   E. Chronic Anemia and Iron Deficiency - MCV intermittently elevated. Folate normal/elevated in 2020. Found to have mild iron deficiency on admission. Repeat iron studies ~ 3 months given we started him on oral iron.  2.  Labs / imaging needed at time of follow-up: BMP, consider CBC, B12, folate    3.  Pending labs/ test needing follow-up: None   Follow-up Appointments:  Follow-up Information    Dawley, Troy C, DO Follow up in 2 week(s).   Why: call for appointment, need to be seen one month after surgery Contact information: [phone]              Hospital Course by problem list: 1. Spinal Cord Compression: During admission at outside hospital, patient suffered spinal injury. After being discharged home from OSH, [facility] as patient was unable to care for himself at home. [facility] and spinal cord decompression were performed. During hospitalization, pain control was difficult given he is on chronic opioid medications. He was eventually stabilized on Oxycontin 30mg 45me daily and Oxycodone 5mg q821murs; IV Dilaudid discontinued. He continued to endorse severe pain on examination but was somnolent and sleeping a good portion of the day per nursing staff. No complications occurred s/p surgery.  2. Chronic discitis, osteomyelitis T10-T12 s/p Fusion: Patient has had multiple previous hospitalizations for spinal osteomyelitis. Aspirate culture collected during surgery without growth. Per Dr. Dawley,Reatha Armour was visible evidence of active infection in the spine. Patient started on broad spectrum antibiotics for 8 weeks. He will  take vamcomycin, ceftriaxone, and rifampin until 10/30/20 via PICC line that he has in place. Patient has follow up scheduled with ID Dr. ManandhWest Bali21.  3. Opioid-induced constipation: Given patient's increased opioid requirement during hospitalization, he subsequently developed worsening constipation. Patient required multiple laxatives, including Senokot-S, Miralax, Colace, and lactulose. Patient was started on Movantik 25mg qd58mcreased dosage due to interaction with rifampin). Unsuccessful attempts by milk and molasses enema, milk of magnesia, and free water enema. Patient was able  to have large bowel movement after SMOG enema and endorses lactulose seems to make the greatest difference. Bowel movements became regular (daily) about 5 days before discharge.   4. Severe Asymptomatic Hypertension: Patient's blood pressure was significantly elevated this admission, although patient denied associated symptoms. Only mildly improved with pain control and remained elevated despite increases in home Metoprolol 18m BID to 1053mBID, Hydralazine 5079mo 100m36mD, and continuation of amlodipine 10mg37m5. Chronic Anemia: Patient intermittently has macrocytosis, however was found to have mild iron deficiency and was started on oral iron polysaccharide pills just prior to discharge. Anemia remained stable.   Discharge Vitals:   BP 139/82   Pulse 75   Temp 98.1 F (36.7 C) (Oral)   Resp 17   Ht _0  (2.007 m)   Wt 95.9 kg   SpO2 98%   BMI 23.82 kg/m   Pertinent Labs, Studies, and Procedures:   CT C/T/L Spine 08/29/20: 1. No acute cervical spine fracture. 2. Multilevel degenerative disc disease of the cervical spine, greatest at the C5-C6, C6-C7, and C7-T1 levels. 3. Partially visualized ground-glass airspace opacities in the upper lobes. This may indicate an underlying atypical infectious process.  MR Thoracic Spine 08/30/20: 1. Chronic discitis/osteomyelitis at T10-11 with active but  improved inflammation and disc space fluid. Superimposed degenerative changes have progressed from February 2021, with disc protrusion and ligament thickening causing cord compression with cord edema. Question instability at this level. 2. No definite epidural collection.  The patient declined contrast.  MR Lumbar Spine 08/30/20:  1. Chronic discitis/osteomyelitis at T10-11 with active but improved inflammation and disc space fluid. Superimposed degenerative changes have progressed from February 2021, with disc protrusion and ligament thickening causing cord compression with cord edema. Question instability at this level. 2. No definite epidural collection.  The patient declined contrast.  Procedure 09/04/20: 1. T10-12 posterolateral arthrodesis 2. T10-12 nonsegmental pedicle srew instrumentation, K2M Everest system 3. T10, 11 bilateral laminectomy and left T10-11 facetectomy for decompression of spinal cord and left T10 nerve root 4. Intraoperative use of microscope for decompression of neural elements 5. Intraoperative use of allograft 6. Intraoperative use of neuromonitoring, SSEP >1 hour  Discharge Instructions: Discharge Instructions    Call MD for:  difficulty breathing, headache or visual disturbances   Complete by: As directed    Call MD for:  extreme fatigue   Complete by: As directed    Call MD for:  persistant dizziness or light-headedness   Complete by: As directed    Call MD for:  persistant nausea and vomiting   Complete by: As directed    Call MD for:  redness, tenderness, or signs of infection (pain, swelling, redness, odor or green/yellow discharge around incision site)   Complete by: As directed    Call MD for:  severe uncontrolled pain   Complete by: As directed    Call MD for:  temperature >100.4   Complete by: As directed    Diet - low sodium heart healthy   Complete by: As directed    Discharge instructions   Complete by: As directed    Mr. GrantEithen, Castiglia [facility] that led to spinal cord compression.  Treatment required surgery.It was also discovered that there is a chronic bone infection in your spinal area called osteomyelitis. For this, you are to complete 8 weeks of antibiotics through an IV. The antibiotics you are currently on are called Ceftriaxone, Vancomycin  and Rifampin. You will follow up with the Infectious Disease team to manage this.   While here, we also adjusted your blood pressure medication. You are to continue taking Metoprolol twice a day, Hydralazine three times a day and Amlodipine once a day.   Your current pain regimen includes: Oxycontin twice daily, Oxycodone three times a day, Methocarbamol (Robaxin, a muscle relaxer) three times a day and Gabapentin three times a day.    We stopped your diabetes medication, including insulin and Tradjenta, as your blood sugar has been well controlled at this time.    At this time, you are headed to a rehabilitation center to work on your strength.   It was a pleasure meeting you and thank you for allowing [place] to participate in your care. We wish you the best!   - Dr. Charleen Kirks   Home infusion instructions   Complete by: As directed    Instructions: Flushing of vascular access device: 0.9% NaCl pre/post medication administration and prn patency; Heparin 100 u/ml, 33m for implanted ports and Heparin 10u/ml, 527mfor all other central venous catheters.   Incentive spirometry RT   Complete by: As directed    Increase activity slowly   Complete by: As directed    No wound care   Complete by: As directed       Signed: SpJeralyn BennettMD 09/25/2020, 2:29 PM   Pager: [phone]

## 2020-09-24 NOTE — TOC Progression Note (Signed)
Transition of Care [facility]) - Progression Note    Patient Details  Name: ZEDRIC DEROY MRN: [id number] Date of Birth: 1951/08/31  Transition of Care [facility] CM/SW [place], RN Phone Number: 09/24/2020, 12:41 PM  Clinical Narrative:    Case management spoke with Daleen Bo, CM with [facility] in [place], [place] to see if the facility was willing to accept the patient for admission since his IV antibiotics have been switched to Vancomycin and Rocephin.  Deal, CM at the facility is checking to see the cost of the medications and will return my call if they are willing to accept the patient for admission.  Will continue to follow the patient for [facility] admission.   Expected Discharge Plan: [place] Barriers to Discharge: Continued Medical Work up  Expected Discharge Plan and Services Expected Discharge Plan: [place]   Discharge Planning Services: CM Consult Post Acute Care Choice: [place] Living arrangements for the past 2 months: [place]                                       Social Determinants of Health (SDOH) Interventions    Readmission Risk Interventions Readmission Risk Prevention Plan 09/17/2019  Transportation Screening Complete  PCP or Specialist Appt within 5-7 Days Not Complete  Not Complete comments snf vs home  Home Care Screening Complete  Medication Review (RN CM) Complete  Some recent data might be hidden

## 2020-09-24 NOTE — Progress Notes (Signed)
   Subjective:   No acute events overnight. [name] states his back pain only bothers him with movement and that he only has numbness in his legs with movement. He is requesting pain medication when being transported to SNF. Says that his pain has overall improved over the last several days.  Objective:  Vital signs in last 24 hours: Vitals:   09/23/20 0940 09/23/20 1507 09/23/20 2115 09/24/20 0519  BP: (!) 148/95 139/77 (!) 146/87 (!) 149/86  Pulse: 62 71 79 62  Resp: 18 20 15 17   Temp: 98.4 F (36.9 C) 97.9 F (36.6 C) 98.8 F (37.1 C) 98.4 F (36.9 C)  TempSrc: Oral Oral Oral Oral  SpO2: 96% 98% 93% 94%  Weight:      Height:       General: Patient was resting comfortably in no acute distress.  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  Cardiology: RRR, no murmurs, rubs or gallops.  Respiratory: CTA bilaterally without increased work of breathing.  Neurological: Patient is alert and oriented.  Assessment/Plan:  Principal Problem:   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HCC) Active Problems:   [name], right ([place])   [name], type 2) (HCC)   CKD (chronic kidney disease), stage IV (HCC)   Physical deconditioning   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 [facility])   Acute midline low back pain with sciatica   Cord compression (HCC)   Drug-induced constipation  # Acute on Chronic Lower Back Pain 2/2 Spinal Cord Compression and Chronic Osteomyelitis / Discitis of T10-T12 Patient states pain has improved slightly over the past several days. Afebrile and HDS, resting comfortably. S/p T10-T12 fusion and spinal cord decompression. PICC line in place for long-term ABX due to heavy history of MRSA. Unfortunately, Daptomycin and Ceftaroline are cost prohibitive for his acceptance to [facility].   - Appreciate ID, pharmacy and neurosurgery recs - Continue Vancomycin (to trough 15-20), Ceftriaxone, and PO Rifampin until 10/30/20 via PICC with close follow up  in ID clinic in 12 days with Dr. West Bali for lab monitoring - Continue Oxycontin 30mg  twice daily - Continue Oxycodone 5mg  q8 hours  - Patient will be discharged to SNF today - Will give additional pain medication so that patient is able to lay flat in ambulance  # Uncontrolled Hypertension Blood pressure continues to remain slightly elevated despite increase in home medication dosing. Patient remains asymptomatic.  - Continue Metoprolol 100mg  twice daily - Continue hydralazine to 100mg  three times daily - Continue home amlodipine 10mg  daily  - follow up outpatient  # Resolved AKI on CKD Stage IV Renal function returned to baseline   - Continue to trend biweekly BMP's on ABX especially given Vancomycin renal injury in past.  # Opioid-Induced Constipation, Resolved - Continue Senokot S, Miralax, Movantik 25mg  daily - Continue Lactulose twice daily - SMOG enema PRN   Prior to Admission Living Arrangement: Home Anticipated Discharge Location: SNF Barriers to Discharge: SNF Placement  Dispo: Discharge Today  Thomas Bennett, MD 09/24/2020, 6:42 AM Pager: [phone] After 5pm on weekdays and 1pm on weekends: On Call pager [phone]

## 2020-09-25 MED ORDER — OXYCODONE HCL 5 MG PO TABS
7.5000 mg | ORAL_TABLET | Freq: Three times a day (TID) | ORAL | Status: DC
  Administered 2020-09-25 – 2020-09-26 (×4): 7.5 mg via ORAL
  Filled 2020-09-25 (×4): qty 2

## 2020-09-25 MED ORDER — OXYCODONE HCL 5 MG PO TABS: 7.5000 mg | ORAL_TABLET | Freq: Three times a day (TID) | ORAL | 0 refills | Status: DC

## 2020-09-25 MED ORDER — MAGNESIUM SULFATE IN D5W 1-5 GM/100ML-% IV SOLN
1.0000 g | Freq: Once | INTRAVENOUS | Status: DC
  Filled 2020-09-25: qty 100

## 2020-09-25 MED ORDER — HYDROMORPHONE HCL 1 MG/ML IJ SOLN: 1.0000 mg | Freq: Once | INTRAMUSCULAR | Status: DC | PRN

## 2020-09-25 MED ORDER — MAGNESIUM SULFATE 2 GM/50ML IV SOLN
2.0000 g | Freq: Once | INTRAVENOUS | Status: AC
  Administered 2020-09-25: 2 g via INTRAVENOUS
  Filled 2020-09-25: qty 50

## 2020-09-25 NOTE — Progress Notes (Signed)
PT Cancellation Note  Patient Details Name: Thomas [name] MRN: [id number] DOB: 01-08-1951   Cancelled Treatment:    Reason Eval/Treat Not Completed: (P) Other (comment) (pt sleeping on first attempt (10AM), on second attempt (1:50 PM) pt refused, reporting severe back pain, lethargic and irritable. Spent >10 minutes encouraging pt to mobilize and discussed drawbacks of decreased mobility, pt continues to refuse.) Will continue efforts per PT POC as schedule permits.   Leeland Lovelady M Maikayla Beggs 09/25/2020, 1:55 PM

## 2020-09-25 NOTE — Care Management Important Message (Signed)
Important Message  Patient Details  Name: Thomas [name] MRN: [id number] Date of Birth: [birth year]   Medicare Important Message Given:  Yes     Barb Merino Brandie Lopes 09/25/2020, 2:48 PM

## 2020-09-25 NOTE — Plan of Care (Signed)

## 2020-09-25 NOTE — Progress Notes (Signed)
Subjective:   TOC CM/SW notes from yesterday states that patient was unable to be accepted to [facility]. Per more recent note today, SNF states that patient was noted to have a history of suicidal ideations and was concerned regarding patient's needs and level of care concerning the matter. However, [name] has not had any suicidal ideation noted this admission. [name] states that he continues to have back pain from his neck to his tailbone, worse over the incision site, although this has improved since his surgery. He expresses desire for increase in pain medication dosing so that he may work more with physical therapy to improve his strength as he feels generally weak. He expresses frustration with recurrent infections over the last 1.5 years, but was reassured that infectious disease continues to follow his care. He is hopeful for discharge soon.  Objective:  Vital signs in last 24 hours: Vitals:   09/24/20 1406 09/24/20 2004 09/25/20 0300 09/25/20 0804  BP: (!) 149/89 (!) 149/81 (!) 144/83 139/82  Pulse: 66 72 70 75  Resp: 18 18 18 17   Temp: 98.6 F (37 C) 99 F (37.2 C) 98.8 F (37.1 C) 98.1 F (36.7 C)  TempSrc: Oral Oral Axillary Oral  SpO2: 98% 97% 97% 98%  Weight:      Height:       General: Patient was resting comfortably in no acute distress.  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  Respiratory: Patient is breathing comfortably on room air without increased work of breathing.  Neurological: Patient is much more alert, oriented, and conversational than yesterday.  Psychiatric: Pleasant and cooperative. No suicidal ideation.  Assessment/Plan:  Principal Problem:   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HCC) Active Problems:   [name], right ([place])   [name], type 2) ([place])   CKD (chronic kidney disease), stage IV (HCC)   Physical deconditioning   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 [facility]   Acute midline low back pain with  sciatica   Cord compression (HCC)   Drug-induced constipation  # Acute on Chronic Lower Back Pain 2/2 Spinal Cord Compression and Chronic Osteomyelitis / Discitis of T10-T12 Patient states pain has improved over the past several days although expresses he would like additional pain medication so that he may work with PT more to improve his deconditioning. Afebrile and HDS, resting comfortably. S/p T10-T12 fusion and spinal cord decompression. PICC line in place for long-term ABX due to heavy history of MRSA. Unfortunately, Daptomycin, Ceftaroline, and reported hx of SI have been prohibitive for his acceptance to [facility].   - Appreciate ID, pharmacy and neurosurgery recs - Continue Vancomycin (at trough), Ceftriaxone, and PO Rifampin until 10/30/20 via PICC with close follow up in ID clinic with Dr. West Bali for monitoring - Continue Oxycontin 30mg  twice daily - Increase Oxycodone IR from 5mg  to 7.5mg  q8 hours  - Hopeful for discharge as soon as patient is accepted to SNF - Will give additional pain medication so that patient is able to lay flat in ambulance  # Hypomagnesemia Magnesium was 1.6 yesterday. Potassium normal.  - Will give 2g Mg Sulfate IV   # Hypertension Blood pressure continues to remain slightly elevated despite increase in home medication dosing, although improved since yesterday. Patient remains asymptomatic.  - Continue Metoprolol 100mg  twice daily - Continue hydralazine to 100mg  three times daily - Continue home amlodipine 10mg  daily  - follow up outpatient  # Resolved AKI on CKD Stage  IV Renal function returned to baseline   - Continue to trend biweekly BMP's on ABX especially given Vancomycin renal injury in past.  # Opioid-Induced Constipation, Resolved - Continue Senokot S, Miralax, Movantik 25mg  daily - Continue Lactulose twice daily - SMOG enema PRN   Prior to Admission Living Arrangement: Home Anticipated Discharge Location: SNF Barriers to  Discharge: SNF Placement  Dispo: Discharge Today  Jeralyn Bennett, MD 09/25/2020, 11:23 AM Pager: [phone] After 5pm on weekdays and 1pm on weekends: On Call pager [phone]

## 2020-09-25 NOTE — Progress Notes (Signed)
Physical Therapy Treatment Patient Details Name: Thomas [name] MRN: [id number] DOB: 02/05/1951 Today's Date: 09/25/2020    History of Present Illness 68 year old person living with a right below-knee amputation, chronic pain syndrome due to a complicated history of thoracic spine infections and degenerative disease, deconditioning after recent Covid infection and comorbidities admitted to our service for medical managements of another episode of chronic thoracic osteomyelitis and discitis with degenerative changes causing spinal cord compression. On 11/16, pt underwent surgical decompression around T11, fusion, and disc aspiration for microbiology    PT Comments    Pt supine on arrival, agreeable to therapy session on third attempt this date however refusing EOB/OOB mobility, agreeable to supine/sidelying exercises. Pt performed supine/sidelying BLE AROM therapeutic exercises with good tolerance, needing min cues for technique and back precautions. Pt given instruction on pressure offloading and risks of immobility/decreased mobility but adamantly refusing EOB/chair transfers due to severe pain. Pt continues to benefit from skilled rehab in a post acute setting to maximize functional gains before returning home.   Follow Up Recommendations  SNF     Equipment Recommendations  None recommended by PT    Recommendations for Other Services       Precautions / Restrictions Precautions Precautions: Fall;Back Precaution Booklet Issued: Yes (comment) Precaution Comments: reviewed back precs, handout given previously Required Braces or Orthoses: Spinal Brace Spinal Brace: Thoracolumbosacral orthotic Spinal Brace Comments: TLSO when OOB Restrictions Weight Bearing Restrictions: No Other Position/Activity Restrictions: hx of R BKA, back precautions    Mobility  Bed Mobility Overal bed mobility: Modified Independent Bed Mobility: Rolling Rolling: Modified independent (Device/Increase  time)         General bed mobility comments: pt refused EOB transition despite encouragement; rolling for sidelying therex modI  Transfers                    Ambulation/Gait                 Stairs             Wheelchair Mobility    Modified Rankin (Stroke Patients Only)       Balance                                            Cognition Arousal/Alertness: Awake/alert Behavior During Therapy: WFL for tasks assessed/performed;Flat affect Overall Cognitive Status: No family/caregiver present to determine baseline cognitive functioning Area of Impairment: Problem solving;Safety/judgement                 Orientation Level: Disoriented to;Time   Memory: Decreased short-term memory;Decreased recall of precautions Following Commands: Follows one step commands with increased time Safety/Judgement: Decreased awareness of safety;Decreased awareness of deficits Awareness: Emergent Problem Solving: Requires verbal cues        Exercises General Exercises - Lower Extremity Ankle Circles/Pumps: AROM;Left;Supine;15 reps Heel Slides: Left;10 reps;Supine;AROM Hip ABduction/ADduction: AROM;Strengthening;Left;10 reps;Supine Straight Leg Raises: AROM;Strengthening;Left;10 reps;Supine Amputee Exercises Towel Squeeze: AROM;Strengthening;Both;10 reps;Supine Hip Extension: AROM;Strengthening;Right;10 reps;Sidelying Hip ABduction/ADduction: AROM;Strengthening;Right;Sidelying;10 reps Knee Flexion: AROM;Strengthening;Right;10 reps;Supine Straight Leg Raises: AROM;Strengthening;Right;10 reps;Supine    General Comments        Pertinent Vitals/Pain Pain Assessment: Faces Faces Pain Scale: Hurts little more Pain Location: entire back Pain Descriptors / Indicators: Grimacing Pain Intervention(s): Monitored during session;Premedicated before session;Repositioned   Vitals:   09/25/20 1449 09/25/20 1611  BP: 134/80 134/80  Pulse: 76  Resp: 17   Temp: 98 F (36.7 C)   SpO2: 100%     Home Living                      Prior Function            PT Goals (current goals can now be found in the care plan section) Acute Rehab PT Goals Patient Stated Goal: pain control, get stronger and get home PT Goal Formulation: With patient Time For Goal Achievement: 09/27/20 Potential to Achieve Goals: Poor Progress towards PT goals: Not progressing toward goals - comment (limited progress due to fear of EOB/OOB mobility -pain)    Frequency    Min 4X/week      PT Plan Current plan remains appropriate    Co-evaluation              AM-PAC PT "6 Clicks" Mobility   Outcome Measure  Help needed turning from your back to your side while in a flat bed without using bedrails?: None Help needed moving from lying on your back to sitting on the side of a flat bed without using bedrails?: A Little Help needed moving to and from a bed to a chair (including a wheelchair)?: A Little Help needed standing up from a chair using your arms (e.g., wheelchair or bedside chair)?: Total Help needed to walk in hospital room?: Total Help needed climbing 3-5 steps with a railing? : Total 6 Click Score: 13    End of Session   Activity Tolerance: Patient limited by pain Patient left: in bed;with call bell/phone within reach;with bed alarm set Nurse Communication: Mobility status PT Visit Diagnosis: Muscle weakness (generalized) (M62.81);Other abnormalities of gait and mobility (R26.89);Pain     Time: 3818-2993 PT Time Calculation (min) (ACUTE ONLY): 8 min  Charges:  $Therapeutic Exercise: 8-22 mins                     Alyana Kreiter P., PTA Acute Rehabilitation Services Pager: [phone] Office: [place] 09/25/2020, 5:13 PM

## 2020-09-25 NOTE — Progress Notes (Signed)
OT Cancellation Note  Patient Details Name: Thomas [name] MRN: [id number] DOB: Feb 13, 1951   Cancelled Treatment:    Reason Eval/Treat Not Completed: Fatigue/lethargy limiting ability to participate;Patient declined, no reason specified Attempted x2 to date, once in the morning and then in the afternoon with PTA. Pt sleeping soundly on initial encounter, and then complaining of increased pain on second encounter. Pt adamant he was not getting up despite having RN provide additional pain medication. Pt educated on the importance of mobilization, however pt continued to decline. Therapy will continue to follow.   Corinne [place]. [place], [place] Acute Rehabilitation Services [place] 09/25/2020, 2:23 PM

## 2020-09-25 NOTE — TOC Progression Note (Addendum)
Transition of Care [facility]) - Progression Note    Patient Details  Name: Thomas [name] MRN: [id number] Date of Birth: 1951/08/30  Transition of Care [facility]) CM/SW [place], RN Phone Number: 09/25/2020, 9:10 AM  Clinical Narrative:    Case management spoke with Daleen Bo, CM this morning regarding patient's admission to the facility.  The facility noted that patient had history of suicidal ideations and was concerned regarding patient's needs and level of care concerning this matter.  The patient is without negative behaviors and/or suicidal ideations and assured the facility of this matter. The facility DON is reviewing the patient's clinicals at this time in hopes of accepting the patient.    Will continue to follow the patient for admission.  09/25/2020 - Spoke with Marita [place] Deal, CM at [facility] and the DON is reviewing the patient's clinicals and patient care needs.  I sent the facility the updated progress notes from the physician this morning that clarifies the patient's behaviors concerning history of suicidal ideations in the past and faxed the updated notes to the facility at [phone].    Expected Discharge Plan: [place] Barriers to Discharge: Continued Medical Work up  Expected Discharge Plan and Services Expected Discharge Plan: [place]   Discharge Planning Services: CM Consult Post Acute Care Choice: [place] Living arrangements for the past 2 months: [place] Expected Discharge Date: 09/24/20                                     Social Determinants of Health (SDOH) Interventions    Readmission Risk Interventions Readmission Risk Prevention Plan 09/17/2019  Transportation Screening Complete  PCP or Specialist Appt within 5-7 Days Not Complete  Not Complete comments snf vs home  Home Care Screening Complete  Medication Review (RN CM) Complete  Some recent data  might be hidden

## 2020-09-26 DIAGNOSIS — R52 Pain, unspecified: Secondary | ICD-10-CM

## 2020-09-26 LAB — BASIC METABOLIC PANEL
Anion gap: 9 (ref 5–15)
BUN: 25 mg/dL — ABNORMAL HIGH (ref 8–23)
CO2: 19 mmol/L — ABNORMAL LOW (ref 22–32)
Calcium: 8.6 mg/dL — ABNORMAL LOW (ref 8.9–10.3)
Chloride: 108 mmol/L (ref 98–111)
Creatinine, Ser: 1.75 mg/dL — ABNORMAL HIGH (ref 0.61–1.24)
GFR, Estimated: 42 mL/min — ABNORMAL LOW (ref >60)
Glucose, Bld: 111 mg/dL — ABNORMAL HIGH (ref 70–99)
Potassium: 4.2 mmol/L (ref 3.5–5.1)
Sodium: 136 mmol/L (ref 135–145)

## 2020-09-26 LAB — MAGNESIUM: Magnesium: 1.8 mg/dL (ref 1.7–2.4)

## 2020-09-26 MED ORDER — BISACODYL 10 MG RE SUPP
10.0000 mg | Freq: Once | RECTAL | Status: AC
  Administered 2020-09-26: 10 mg via RECTAL
  Filled 2020-09-26: qty 1

## 2020-09-26 MED ORDER — OXYCODONE HCL 5 MG PO TABS
10.0000 mg | ORAL_TABLET | ORAL | Status: DC
  Administered 2020-09-26 – 2020-09-28 (×5): 10 mg via ORAL
  Filled 2020-09-26 (×5): qty 2

## 2020-09-26 NOTE — Progress Notes (Signed)
Subjective:   Today, patient states that he continues to have back pain along his entire spine. He says he had recently been sleeping more to keep his pain away, not due to his pain medication. He is frustrated at not receiving higher doses or IV pain medication. Consulting notes state that patient has not been participating much in sessions; however, patient states this is due to pain. He states he would like to be able to work more with PT and is hopeful his pain can be better controlled.   Objective:  Vital signs in last 24 hours: Vitals:   09/26/20 1217 09/26/20 1218 09/26/20 1433 09/26/20 1540  BP: (!) 160/89 (!) 160/89 (!) 153/86 (!) 153/86  Pulse: 63  72   Resp:   18   Temp:   98 F (36.7 C)   TempSrc:   Oral   SpO2:   99%   Weight:      Height:       General: Patient was resting comfortably in no acute distress.  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  Respiratory: Patient is breathing comfortably on room air without increased work of breathing.  Neurological: Patient alert and oriented.  Psychiatric: Pleasant and cooperative. No suicidal ideation.  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 Afebrile and HDS, resting comfortably. S/p T10-T12 fusion and spinal cord decompression. PICC line in place for long-term ABX due to heavy  history of MRSA. Unfortunately, Daptomycin, Ceftaroline, and reported hx of SI have been prohibitive for his acceptance to [facility]. TOC found possible SNF for patient to go to early next week.  - Appreciate ID, pharmacy and neurosurgery recs - Continue Vancomycin (at trough), Ceftriaxone, and PO Rifampin until 10/30/20 via PICC with close follow up in ID clinic with Dr. West Bali for monitoring - Continue Oxycontin 30mg  twice daily - Increase Oxycodone IR to 10mg  q4 hours while awake - Hopeful for discharge as soon as patient is accepted to SNF - Will give additional pain medication so that patient is able to lay flat in ambulance  # Hypomagnesemia, Resolved Magnesium normalized after replacement. - Will hold rechecking  # Hypertension Blood pressure continues to remain elevated despite increase in home medication dosing, although improved since yesterday. Patient remains asymptomatic.  - Continue Metoprolol 100mg  twice daily - Continue hydralazine to 100mg  three times daily - Continue home amlodipine 10mg  daily  - follow up outpatient  # Resolved AKI on CKD Stage IV Renal function returned to baseline   - Continue to trend biweekly BMP's on ABX especially given Vancomycin renal injury in past.  # Opioid-Induced Constipation, Resolved - Continue Senokot S, Miralax, Movantik 25mg  daily - Continue Lactulose twice daily - SMOG enema PRN   Prior to Admission Living Arrangement: Home Anticipated Discharge Location: SNF Barriers to Discharge: SNF Placement  Dispo: Discharge  Today  Jeralyn Bennett, MD 09/26/2020, 4:11 PM Pager: [phone] After 5pm on weekdays and 1pm on weekends: On Call pager [phone]

## 2020-09-26 NOTE — Plan of Care (Signed)

## 2020-09-26 NOTE — TOC Progression Note (Addendum)
Transition of Care [facility]) - Progression Note    Patient Details  Name: Thomas [name] MRN: [id number] Date of Birth: 1951/02/19  Transition of Care [facility]) CM/SW [place], RN Phone Number: 09/26/2020, 10:04 AM  Clinical Narrative:    Case management spoke with Thomas [name], CM at [place], [facility] and the facility explained that they are unwilling to admit the patient at their facilities due to the patient's history of suicidal ideations and care needed at the facility.  I spoke with the patient and updated him that I will continue to explore area SNFs that have bed availability for the patient.  09/26/2020 1227 - I spoke with the patient at the bedside and he was agreeable to placement at [facility].  The patient was given the phone number, address and fax number to the [facility] facility at his request.  The patient states that he was going to call and have medical records sent to the SNF facility of his admission history.  Case management will continue to follow the patient for SNF placement.   Expected Discharge Plan: [place] Barriers to Discharge: Continued Medical Work up  Expected Discharge Plan and Services Expected Discharge Plan: [place]   Discharge Planning Services: CM Consult Post Acute Care Choice: [place] Living arrangements for the past 2 months: [place] Expected Discharge Date: 09/24/20                                     Social Determinants of Health (SDOH) Interventions    Readmission Risk Interventions Readmission Risk Prevention Plan 09/17/2019  Transportation Screening Complete  PCP or Specialist Appt within 5-7 Days Not Complete  Not Complete comments snf vs home  Home Care Screening Complete  Medication Review (RN CM) Complete  Some recent data might be hidden

## 2020-09-27 MED ORDER — OXYCODONE HCL 5 MG PO TABS
10.0000 mg | ORAL_TABLET | Freq: Once | ORAL | Status: AC
  Administered 2020-09-27: 10 mg via ORAL
  Filled 2020-09-27: qty 2

## 2020-09-27 MED ORDER — GABAPENTIN 300 MG PO CAPS
600.0000 mg | ORAL_CAPSULE | Freq: Three times a day (TID) | ORAL | Status: DC
  Administered 2020-09-27 – 2020-10-03 (×19): 600 mg via ORAL
  Filled 2020-09-27 (×19): qty 2

## 2020-09-27 MED ORDER — ACETAMINOPHEN 500 MG PO TABS
1000.0000 mg | ORAL_TABLET | Freq: Three times a day (TID) | ORAL | Status: DC
  Administered 2020-09-27 – 2020-10-03 (×17): 1000 mg via ORAL
  Filled 2020-09-27 (×18): qty 2

## 2020-09-27 NOTE — Plan of Care (Signed)
  Problem: Education: Goal: Knowledge of General Education information will improve Description: Including pain rating scale, medication(s)/side effects and non-pharmacologic comfort measures Outcome: Progressing   Problem: Health Behavior/Discharge Planning: Goal: Ability to manage health-related needs will improve Outcome: Progressing   Problem: Clinical Measurements: Goal: Ability to maintain clinical measurements within normal limits will improve Outcome: Progressing   Problem: Activity: Goal: Risk for activity intolerance will decrease Outcome: Not Progressing Pt is reluctant to work with physical therapist despite encouragement from staff.   Problem: Nutrition: Goal: Adequate nutrition will be maintained Outcome: Progressing   Problem: Coping: Goal: Level of anxiety will decrease Outcome: Progressing   Problem: Safety: Goal: Ability to remain free from injury will improve Outcome: Progressing

## 2020-09-27 NOTE — Progress Notes (Addendum)
Pharmacy Antibiotic Note  Thomas [name] is a 69 y.o. male admitted on 08/29/2020 with acute on chronic vertebral infection. Pharmacy has been consulted for vancomycin dosing. Pt to be discharged to SNF today; unfortunately, SNF will not take patient on daptomycin or ceftaroline, though he has had issues with AKI in the past on vancomycin.   Scr down, ClCr ~54 ml/min, UOP 1.7ml/kg/hr. Patient may accumulate vancomycin from last level, will recheck level 12/10.   Plan: Continue vancomycin 1250 mg every 24 hours  Monitor renal function, vancomycin levels (goal trough: 15-20 mg/L) F/u VT and BMP 12/10   Height: 6\' 7"  (200.7 cm) Weight: 95 kg (209 lb 7 oz) IBW/kg (Calculated) : 93.7  Temp (24hrs), Avg:98.1 F (36.7 C), Min:97.9 F (36.6 C), Max:98.4 F (36.9 C)  Recent Labs  Lab 09/22/20 0325 09/23/20 0358 09/24/20 0415 09/24/20 1637 09/26/20 0354  WBC  --   --  7.9  --   --   CREATININE 1.84* 1.87*  --   --  1.75*  VANCOTROUGH  --   --   --  18  --     Estimated Creatinine Clearance: 54.3 mL/min (A) (by C-G formula based on SCr of 1.75 mg/dL (H)).    Allergies  Allergen Reactions  . Other Other (See Comments)    Blood pressure issues Per [facility] hospital: Pt states he can only take these pain meds or else he gets very sick: Oxycontin,morphine,demerol, and dilaudid are the only pain meds pt states he can take.    Marland Kitchen Oxymorphone Other (See Comments)    Causes kidney problems  . Aspirin Nausea And Vomiting    Per [facility]  . Beta Vulgaris Nausea And Vomiting    Beets  . Buspirone Other (See Comments)    unknown  . Cabbage Nausea And Vomiting  . Codeine Other (See Comments)    unknown  . Dolobid [Diflunisal]     Unknown reaction  . Fish Allergy Nausea And Vomiting  . Fish-Derived Products Nausea And Vomiting  . Methadone     Unknown reaction  . Pentazocine Other (See Comments)    Unknown reaction  . Propoxyphene Other (See Comments)    Unknown reaction   . Shellfish Allergy Nausea And Vomiting  . Sulfa Antibiotics Hives  . Sulfasalazine Other (See Comments)    Unknown reaction  . Vistaril [Hydroxyzine]     Unknown reaction  . Amoxicillin Rash    Per [facility]   Antibiotics: Vanc 11/16 >>11/17 Cubicin 11/17>> (12/2) CTX 11/17 >> 12/2, 12/3>>>(10/30/20) Vanc 12/3>> (10/30/20) Rifampin 11/18 >> (10/30/20)  11/18 CK = 33 11/29 CK = 48 12/6 VT: 18 at goal, continue   Microbiology 11/10 BCx - negative 11/11 UCx - negative 11/15 surgical PCR - positive MRSA/MSSA 11/16 T10-11 tissue - negative  Thank you for allowing pharmacy to be a part of this patient's care.  Gillermina Hu, PharmD, BCPS, [facility] Clinical Pharmacist 09/24/20, 18:40 PM

## 2020-09-27 NOTE — Progress Notes (Signed)
   Subjective:   Mr. Kronberg continues to endorse back pain although states his pain has gotten significantly better since surgery. No significant improvement since yesterday. Understands he does not have a true oxycodone allergy now. Asking for number for SNF to call.   Objective:  Vital signs in last 24 hours: Vitals:   09/26/20 1540 09/26/20 2001 09/27/20 0300 09/27/20 0820  BP: (!) 153/86 (!) 146/76 139/71 (!) 146/87  Pulse:  71 70 61  Resp:  18 16 16   Temp:  98.4 F (36.9 C) 98.2 F (36.8 C) 97.9 F (36.6 C)  TempSrc:  Oral Axillary Oral  SpO2:  99% 100% 98%  Weight:      Height:       General: Patient was resting comfortably in no acute distress.  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  Respiratory: Patient is breathing comfortably on room air without increased work of breathing.  Neurological: Patient alert and oriented.  Psychiatric: Pleasant and cooperative. No suicidal ideation.  Assessment/Plan:  Principal Problem:   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HCC) Active Problems:   [name], right ([place])   [name], type 2) ([place])   CKD (chronic kidney disease), stage IV (HCC)   Physical deconditioning   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 [facility])   Acute midline low back pain with sciatica   Cord compression (HCC)   Drug-induced constipation   Pain management  # Acute on Chronic Lower Back Pain 2/2 Spinal Cord Compression and Chronic Osteomyelitis / Discitis of T10-T12 Patient states pain has improved over the past several days although expresses he would like additional pain medication so that he may work with PT more to improve his deconditioning. Afebrile and HDS, resting comfortably. S/p T10-T12 fusion and spinal cord decompression. PICC line in place for long-term ABX due to heavy history of MRSA. Unfortunately, patient no accepted to [facility]. TOC found possible SNF for patient to go to early next week.  - Appreciate ID, pharmacy  and neurosurgery recs - Continue Vancomycin, Ceftriaxone, and PO Rifampin until 10/30/20 via PICC with close follow up in ID clinic with Dr. West Bali for monitoring - Continue Oxycontin 30mg  twice daily - Increase Oxycodone IR to 10mg  q4 hours while awake - Increase gabapentin to 600mg  three times daily - Start Tylenol 1g q8 hours scheduled - Hopeful for discharge as soon as patient is accepted to SNF - Will give additional pain medication so that patient is able to lay flat in ambulance - Time administration of oxycodone 46mins prior to PT/OT   # Hypertension Blood pressure continues to remain elevated despite increase in home medication dosing. Patient remains asymptomatic.  - Continue Metoprolol 100mg  twice daily - Continue hydralazine to 100mg  three times daily - Continue home amlodipine 10mg  daily  - follow up outpatient  # Resolved AKI on CKD Stage IV Renal function returned to baseline   - Continue to trend biweekly BMP's on ABX especially given Vancomycin renal injury in past.  # Opioid-Induced Constipation, Resolved - Continue Senokot S, Miralax, Movantik 25mg  daily - Continue Lactulose twice daily - SMOG enema PRN  - Hold medications as needed if > 1 stool daily  Prior to Admission Living Arrangement: Home Anticipated Discharge Location: SNF Barriers to Discharge: SNF Placement   Jeralyn Bennett, MD 09/27/2020, 9:06 AM Pager: [phone] After 5pm on weekdays and 1pm on weekends: On Call pager [phone]

## 2020-09-27 NOTE — Progress Notes (Addendum)
OT Cancellation Note  Patient Details Name: Thomas [name] MRN: [id number] DOB: 07/20/1951   Cancelled Treatment:    Reason Eval/Treat Not Completed: Fatigue/lethargy limiting ability to participate. Coordinated with RN for premedication prior to therapy this AM. On entry, pt soundly sleeping and did not awaken to voice. Will re-attempt OT session as time allows.   Addendum: Attempted OT session this PM. Pt awake but lethargic, reports feeling too sick and hot (denies N&V and declined cool washcloth to forehead). Pt also reports too much back pain to participate. Requested therapist to come back later.   Layla Maw 09/27/2020, 12:35 PM

## 2020-09-28 LAB — BASIC METABOLIC PANEL
Anion gap: 12 (ref 5–15)
BUN: 26 mg/dL — ABNORMAL HIGH (ref 8–23)
CO2: 18 mmol/L — ABNORMAL LOW (ref 22–32)
Calcium: 8.8 mg/dL — ABNORMAL LOW (ref 8.9–10.3)
Chloride: 108 mmol/L (ref 98–111)
Creatinine, Ser: 1.69 mg/dL — ABNORMAL HIGH (ref 0.61–1.24)
GFR, Estimated: 44 mL/min — ABNORMAL LOW (ref >60)
Glucose, Bld: 90 mg/dL (ref 70–99)
Potassium: 4.8 mmol/L (ref 3.5–5.1)
Sodium: 138 mmol/L (ref 135–145)

## 2020-09-28 LAB — VANCOMYCIN, TROUGH: Vancomycin Tr: 27 ug/mL (ref 15–20)

## 2020-09-28 MED ORDER — OXYCODONE HCL 5 MG PO TABS
7.5000 mg | ORAL_TABLET | ORAL | Status: DC
  Administered 2020-09-28 – 2020-10-03 (×18): 7.5 mg via ORAL
  Filled 2020-09-28 (×20): qty 2

## 2020-09-28 MED ORDER — VANCOMYCIN HCL IN DEXTROSE 1-5 GM/200ML-% IV SOLN
1000.0000 mg | INTRAVENOUS | Status: DC
  Administered 2020-09-28 – 2020-10-01 (×3): 1000 mg via INTRAVENOUS
  Filled 2020-09-28 (×3): qty 200

## 2020-09-28 NOTE — Progress Notes (Signed)
Physical Therapy Treatment Patient Details Name: Thomas [name] MRN: [id number] DOB: 02-22-1951 Today's Date: 09/28/2020    History of Present Illness 69 year old person living with a right below-knee amputation, chronic pain syndrome due to a complicated history of thoracic spine infections and degenerative disease, deconditioning after recent Covid infection and comorbidities admitted to our service for medical managements of another episode of chronic thoracic osteomyelitis and discitis with degenerative changes causing spinal cord compression. On 11/16, pt underwent surgical decompression around T11, fusion, and disc aspiration for microbiology    PT Comments    Had pt premedicated for pain (oral pain meds ~40 mins prior to therapy), despite this pt still reports too painful when he tries to sit.  Reports "I take more than that at home" in reference to medication. Pt was agreeable to bed exercises and bed mobility, but refused to sit until pain controlled when sitting.  Pt was able to perform bed exercises and mobility without difficulty but did require frequent cues for back precautions.   Sent message to MD in regards to pt's reports about meds and performance with PT.     Follow Up Recommendations  SNF     Equipment Recommendations  None recommended by PT    Recommendations for Other Services       Precautions / Restrictions Precautions Precautions: Fall;Back Precaution Booklet Issued: Yes (comment) Precaution Comments: reviewed back precs, handout given previously Required Braces or Orthoses: Spinal Brace Spinal Brace: Thoracolumbosacral orthotic Spinal Brace Comments: TLSO when OOB    Mobility  Bed Mobility Overal bed mobility: Needs Assistance Bed Mobility: Rolling Rolling: Modified independent (Device/Increase time)         General bed mobility comments: Rolling with frequent multimodal cues to maintain back precautions; declined sitting EOB despite  premedicated for pain  Transfers                    Ambulation/Gait                 Stairs             Wheelchair Mobility    Modified Rankin (Stroke Patients Only)       Balance                                            Cognition Arousal/Alertness: Awake/alert Behavior During Therapy: WFL for tasks assessed/performed Overall Cognitive Status: Impaired/Different from baseline                           Safety/Judgement: Decreased awareness of safety;Decreased awareness of deficits     General Comments: Pt unable to recall back precautions and required reinforcement with bed mobiltiy      Exercises General Exercises - Lower Extremity Ankle Circles/Pumps: AROM;Left;Supine;15 reps Quad Sets: AROM;Both;10 reps;Supine Gluteal Sets: AROM;Strengthening;Both;Supine;10 reps Short Arc Quad: AROM;Strengthening;Both;10 reps;Supine Hip ABduction/ADduction: AROM;Strengthening;10 reps;Supine;Both Straight Leg Raises: AROM;Strengthening;10 reps;Supine;Both    General Comments  Educated on importance of mobility and trying to increase OOB activity for him to be able to reach his goals of returning home.       Pertinent Vitals/Pain Pain Assessment: Faces Faces Pain Scale: Hurts a little bit Pain Location: Entire back; reports pain starts when get pressure on pelvis to sit or lift HOB; pain controlled during therapy as pt declined to sit Pain Descriptors /  Indicators: Grimacing Pain Intervention(s): Monitored during session;Limited activity within patient's tolerance;Premedicated before session;Repositioned    Home Living                      Prior Function            PT Goals (current goals can now be found in the care plan section) Acute Rehab PT Goals Patient Stated Goal: pain control, get stronger and get home PT Goal Formulation: With patient Time For Goal Achievement: 10/12/20 Potential to Achieve Goals:  Fair Progress towards PT goals: Not progressing toward goals - comment (limited by pain/fear of pain)    Frequency    Min 3X/week      PT Plan Frequency needs to be updated    Co-evaluation              AM-PAC PT "6 Clicks" Mobility   Outcome Measure  Help needed turning from your back to your side while in a flat bed without using bedrails?: A Little Help needed moving from lying on your back to sitting on the side of a flat bed without using bedrails?: A Little Help needed moving to and from a bed to a chair (including a wheelchair)?: Total Help needed standing up from a chair using your arms (e.g., wheelchair or bedside chair)?: Total Help needed to walk in hospital room?: Total Help needed climbing 3-5 steps with a railing? : Total 6 Click Score: 10    End of Session   Activity Tolerance: Patient limited by pain Patient left: in bed;with call bell/phone within reach;with bed alarm set Nurse Communication: Mobility status PT Visit Diagnosis: Muscle weakness (generalized) (M62.81);Other abnormalities of gait and mobility (R26.89);Pain     Time: 0076-2263 PT Time Calculation (min) (ACUTE ONLY): 32 min  Charges:  $Therapeutic Exercise: 8-22 mins $Therapeutic Activity: 8-22 mins                     Abran Richard, PT Acute Rehab Services Pager [phone] [facility] [place] 09/28/2020, 5:44 PM

## 2020-09-28 NOTE — Progress Notes (Addendum)
Pharmacy Antibiotic Note  Thomas [name] is a 69 y.o. male admitted on 08/29/2020 with acute on chronic vertebral infection. Pharmacy has been consulted for vancomycin dosing. Pt to be discharged to SNF today; unfortunately, SNF will not take patient on daptomycin or ceftaroline, though he has had issues with AKI in the past on vancomycin.   Steady-state vancomycin trough level drawn ~22 hrs after last dose of vancomycin 1250 mg IV Q 24 hrs regimen was 27 mg/, which is below the goal vancomycin trough range. Although level was drawn ~2 hrs early, it appears that vancomycin is starting to accumulate in this pt with prior hx of renal issues with vancomycin. Scr today is 1.69, ClCr ~56.2 ml/min.  WBC 7.9, afebrile  Plan: Reduce vancomycin regimen to 1000 mg IV Q 24 hrs; wait ~8 hrs after level drawn to allow vancomycin level to fall <20 mg/L before giving next dose Monitor daily SCr Monitor WBC, temp, vancomycin levels (goal trough: 15-20 mg/L)  Height: 6\' 7"  (200.7 cm) Weight: 95 kg (209 lb 7 oz) IBW/kg (Calculated) : 93.7  Temp (24hrs), Avg:97.9 F (36.6 C), Min:97.6 F (36.4 C), Max:98 F (36.7 C)  Recent Labs  Lab 09/22/20 0325 09/23/20 0358 09/24/20 0415 09/24/20 1637 09/26/20 0354 09/28/20 1617  WBC  --   --  7.9  --   --   --   CREATININE 1.84* 1.87*  --   --  1.75* 1.69*  VANCOTROUGH  --   --   --  18  --  27*    Estimated Creatinine Clearance: 56.2 mL/min (A) (by C-G formula based on SCr of 1.69 mg/dL (H)).    Allergies  Allergen Reactions  . Other Other (See Comments)    Blood pressure issues Per [facility] hospital: Pt states he can only take these pain meds or else he gets very sick: Oxycontin,morphine,demerol, and dilaudid are the only pain meds pt states he can take.    Marland Kitchen Oxymorphone Other (See Comments)    Causes kidney problems  . Aspirin Nausea And Vomiting    Per [facility]  . Beta Vulgaris Nausea And Vomiting    Beets  . Buspirone Other (See  Comments)    unknown  . Cabbage Nausea And Vomiting  . Codeine Other (See Comments)    unknown  . Dolobid [Diflunisal]     Unknown reaction  . Fish Allergy Nausea And Vomiting  . Fish-Derived Products Nausea And Vomiting  . Methadone     Unknown reaction  . Pentazocine Other (See Comments)    Unknown reaction  . Propoxyphene Other (See Comments)    Unknown reaction  . Shellfish Allergy Nausea And Vomiting  . Sulfa Antibiotics Hives  . Sulfasalazine Other (See Comments)    Unknown reaction  . Vistaril [Hydroxyzine]     Unknown reaction  . Amoxicillin Rash    Per [facility]   Antibiotics: Vanc 11/16 >>11/17 Cubicin 11/17>> (12/2) CTX 11/17 >> 12/2, 12/3>>>(10/30/20) Vanc 12/3>> (10/30/20) Rifampin 11/18 >> (10/30/20)  11/18 CK = 33 11/29 CK = 48 12/6 VT: 18 at goal, continue   Microbiology 11/10 BCx - negative 11/11 UCx - negative 11/15 surgical PCR - positive MRSA/MSSA 11/16 T10-11 tissue - negative  Thank you for allowing pharmacy to be a part of this patient's care.  Gillermina Hu, PharmD, BCPS, [facility] Clinical Pharmacist 09/24/20, 18:40 PM

## 2020-09-28 NOTE — Plan of Care (Signed)

## 2020-09-28 NOTE — Progress Notes (Signed)
Subjective:   Thomas [name] was initially sleeping when we walked into the room. He remained tired, endorsing continuation of his pain, and agrees that he did not participate with OT because of this. He says he will try to work more with PT/OT and is eager to get placement into SNF. He denies any fevers, chills, or any new symptoms and continues to have regular bowel movements.    Objective:  Vital signs in last 24 hours: Vitals:   09/28/20 0354 09/28/20 0843 09/28/20 1219 09/28/20 1220  BP: 131/77 (!) 160/91 (!) 160/91 (!) 160/91  Pulse: (!) 57 61  61  Resp: 18 16    Temp: 97.6 F (36.4 C) 97.9 F (36.6 C)    TempSrc: Oral Oral    SpO2:  98%    Weight:      Height:       General: Patient was resting comfortably in no acute distress.  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  Cardiology: RRR, no murmurs, rubs or gallops. Neurological: Patient is somnolent.  Psychiatric: Pleasant and cooperative.   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 Afebrile and HDS, resting comfortably. S/p T10-T12 fusion and spinal cord decompression. PICC line in place for long-term ABX due to heavy history of MRSA. Unfortunately, patient no accepted to [facility]. TOC found possible SNF for patient to go to early next week.  - Appreciate ID,  pharmacy and neurosurgery recs - Continue Vancomycin, Ceftriaxone, and PO Rifampin until 10/30/20 via PICC with close follow up in ID clinic with Dr. West Bali for monitoring - Continue Oxycontin 30mg  twice daily - Decrease Oxycodone to 7.5mg  q4 hours when awake for pain given somnolence - Continue 600mg  three times daily; will decrease morning dosing if patient remains - Start Tylenol 1g q8 hours scheduled - Hopeful for discharge as soon as patient is accepted to SNF - Will give additional pain medication so that patient is able to lay flat in ambulance - Time administration of oxycodone 70mins prior to PT/OT   # Hypertension Blood pressures vary from the 130's/70's to 160's/90's. Likely due to chronic vascular disease.   - Continue Metoprolol 100mg  twice daily - Continue hydralazine to 100mg  three times daily - Continue home amlodipine 10mg  daily  - follow up outpatient  # Resolved AKI on CKD Stage IV Renal function is improved compared to previous.   - Continue to trend biweekly BMP's on ABX especially given Vancomycin renal injury in past.  # Opioid-Induced Constipation, Resolved - Continue Senokot S, Miralax, Movantik 25mg  daily - Continue Lactulose twice daily - SMOG enema PRN  - Responding well to suppository.  Prior to Admission Living Arrangement: Home Anticipated Discharge Location: SNF Barriers to Discharge: SNF Placement   Jeralyn Bennett, MD 09/28/2020, 2:44 PM Pager: [phone] After 5pm on weekdays and  1pm on weekends: On Call pager [phone]

## 2020-09-29 LAB — CREATININE, SERUM
Creatinine, Ser: 1.88 mg/dL — ABNORMAL HIGH (ref 0.61–1.24)
GFR, Estimated: 39 mL/min — ABNORMAL LOW (ref >60)

## 2020-09-29 NOTE — Progress Notes (Signed)
  Date: 09/29/2020  Patient name: Thomas [name] [place] record number: [id number]  Date of birth: 03-26-1951        I have seen and evaluated this patient and I have discussed the plan of care with the house staff. Please see their note for complete details. I concur with Dr Noni Saupe findings.  Lucious Groves, DO 09/29/2020, 12:09 PM

## 2020-09-29 NOTE — Progress Notes (Signed)
   Subjective:   No overnight events. Patient states he was just waking up as we came by for morning rounds. He denies any new complaints or acute complaints.   Objective:  Vital signs in last 24 hours: Vitals:   09/28/20 1546 09/28/20 1607 09/28/20 1936 09/29/20 0335  BP: (!) 157/90 (!) 157/90 117/74 130/85  Pulse: 69  76 66  Resp: 17  16 17   Temp: 98 F (36.7 C)  98.4 F (36.9 C) 98 F (36.7 C)  TempSrc:      SpO2: 98%  96% 95%  Weight:      Height:       Physical Exam Vitals and nursing note reviewed.  Constitutional:      General: He is not in acute distress.    Appearance: He is normal weight.  Pulmonary:     Effort: Pulmonary effort is normal. No respiratory distress.  Neurological:     General: No focal deficit present.     Mental Status: He is alert and oriented to person, place, and time. Mental status is at baseline.  Psychiatric:        Mood and Affect: Mood normal.        Behavior: Behavior normal.    Assessment/Plan:  Principal Problem:   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HCC) Active Problems:   [name], right (HCC)   [name], type 2) (HCC)   CKD (chronic kidney disease), stage IV (HCC)   Physical deconditioning   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 [facility])   Acute midline low back pain with sciatica   Cord compression (HCC)   Drug-induced constipation   Pain management  # Acute on Chronic Lower Back Pain # Spinal Cord Compression s/p decompression  # Chronic Osteomyelitis / Discitis of T10-T12 - Appreciate ID, pharmacy and neurosurgery recs - Continue Vancomycin, Ceftriaxone, and PO Rifampin until 10/30/20 via PICC with close follow up in ID clinic with Dr. West Bali for monitoring - Oxycontin 30mg  twice daily - Oxycodone to 7.5mg  q4 hours (when awake only) - Gabapentin 600mg  three times daily - Tylenol 1g q8 hours scheduled - Time administration of oxycodone 30mins prior to PT/OT    # Hypertension BP stable  today.   - Continue Metoprolol 100mg  twice daily - Continue hydralazine to 100mg  three times daily - Continue home amlodipine 10mg  daily  - Follow up outpatient  # CKD Stage IV # AKI, resolved - Continue to trend biweekly BMP's on ABX especially given Vancomycin renal injury in past.  # Opioid-Induced Constipation, Resolved - Continue Senokot S, Miralax, Movantik 25mg  daily, Dulcolax suppository, Lactulose BID - SMOG enema PRN   Prior to Admission Living Arrangement: Home Anticipated Discharge Location: SNF Barriers to Discharge: Placement  Dispo: Anticipated discharge in approximately 2  day(s).   Dr. Jose Persia Internal Medicine PGY-2  Pager: [phone] After 5pm on weekdays and 1pm on weekends: On Call pager [phone]  09/29/2020, 6:48 AM

## 2020-09-30 LAB — CREATININE, SERUM
Creatinine, Ser: 1.8 mg/dL — ABNORMAL HIGH (ref 0.61–1.24)
GFR, Estimated: 41 mL/min — ABNORMAL LOW (ref >60)

## 2020-09-30 NOTE — Plan of Care (Signed)
  Problem: Education: Goal: Knowledge of General Education information will improve Description: Including pain rating scale, medication(s)/side effects and non-pharmacologic comfort measures Outcome: Progressing   Problem: Clinical Measurements: Goal: Will remain free from infection Outcome: Progressing Goal: Respiratory complications will improve Outcome: Progressing   

## 2020-09-30 NOTE — Progress Notes (Addendum)
   Subjective:   No overnight events. Mr. Jarboe continues to express anxiety about his pain being controlled for the ambulance ride although was reassured he would get pain medications before leaving. Pain continues to improve. Does not recall PT/OT stopping by yesterday but is performing activities prescribed to him in bed.   Objective:  Vital signs in last 24 hours: Vitals:   09/29/20 1431 09/29/20 2030 09/30/20 0500 09/30/20 0755  BP: (!) 149/90 (!) 143/81 (!) 152/68 (!) 134/94  Pulse: 75 74 70 68  Resp: 17 18  18   Temp: 97.6 F (36.4 C) 98.8 F (37.1 C) 98.9 F (37.2 C) 98.3 F (36.8 C)  TempSrc: Oral  Oral Oral  SpO2: 100% 99% 100% 98%  Weight:   98.5 kg   Height:       General: Resting comfortably in no acute distress. Cardiology: RRR. No murmurs, rubs or gallops. Abdominal: No tenderness. Bowel sounds intact.   Psychiatric: Pleasant and cooperative.   Assessment/Plan:  Principal Problem:   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HCC) Active Problems:   [name], right ([place])   [name], type 2) (HCC)   CKD (chronic kidney disease), stage IV (HCC)   Physical deconditioning   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 [facility])   Acute midline low back pain with sciatica   Cord compression (HCC)   Drug-induced constipation   Pain management  # Acute on Chronic Lower Back Pain # Spinal Cord Compression s/p decompression  # Chronic Osteomyelitis / Discitis of T10-T12 s/p fusion T10-T12 - Appreciate ID, pharmacy and neurosurgery recs - Continue Vancomycin, Ceftriaxone, and PO Rifampin until 10/30/20 via PICC with close follow up in ID clinic with Dr. West Bali for monitoring - Dosing of vancomycin per pharmacy  - Oxycontin 30mg  twice daily - Oxycodone to 7.5mg  q4 hours (when awake only) - Gabapentin 600mg  three times daily - Tylenol 1g q8 hours scheduled - Time administration of oxycodone 76mins prior to PT/OT   - Will require additional dose of pain  medication prior to ambulance ride  # Hypertension BP stable today.   - Continue Metoprolol 100mg  twice daily - Continue hydralazine to 100mg  three times daily - Continue home amlodipine 10mg  daily  - Follow up outpatient  # CKD Stage IV # AKI, resolved Renal function remains stable.   - Continue to trend biweekly BMP's on ABX especially given Vancomycin renal injury in past.  # Opioid-Induced Constipation, Resolved Continues to have regular bowel movements.  - Continue Senokot S, Miralax, Movantik 25mg  daily, Dulcolax suppository, Lactulose BID, Dulcolax suppository. - SMOG enema PRN   Prior to Admission Living Arrangement: Home Anticipated Discharge Location: SNF Barriers to Discharge: Placement  Dispo: Anticipated discharge in approximately 1-2 day(s).   Dr. Jeralyn Bennett Internal Medicine PGY-1  Pager: [phone] After 5pm on weekdays and 1pm on weekends: On Call pager [phone]  09/30/2020, 11:24 AM

## 2020-10-01 LAB — HEPATIC FUNCTION PANEL
ALT: 12 U/L (ref 0–44)
AST: 17 U/L (ref 15–41)
Albumin: 2.7 g/dL — ABNORMAL LOW (ref 3.5–5.0)
Alkaline Phosphatase: 65 U/L (ref 38–126)
Bilirubin, Direct: <0.1 mg/dL (ref 0.0–0.2)
Total Bilirubin: 0.7 mg/dL (ref 0.3–1.2)
Total Protein: 6.3 g/dL — ABNORMAL LOW (ref 6.5–8.1)

## 2020-10-01 LAB — CBC
HCT: 28.8 % — ABNORMAL LOW (ref 39.0–52.0)
Hemoglobin: 8.8 g/dL — ABNORMAL LOW (ref 13.0–17.0)
MCH: 31.4 pg (ref 26.0–34.0)
MCHC: 30.6 g/dL (ref 30.0–36.0)
MCV: 102.9 fL — ABNORMAL HIGH (ref 80.0–100.0)
Platelets: 172 10*3/uL (ref 150–400)
RBC: 2.8 MIL/uL — ABNORMAL LOW (ref 4.22–5.81)
RDW: 16.9 % — ABNORMAL HIGH (ref 11.5–15.5)
WBC: 6.5 10*3/uL (ref 4.0–10.5)
nRBC: 0 % (ref 0.0–0.2)

## 2020-10-01 LAB — CREATININE, SERUM
Creatinine, Ser: 1.77 mg/dL — ABNORMAL HIGH (ref 0.61–1.24)
GFR, Estimated: 42 mL/min — ABNORMAL LOW (ref >60)

## 2020-10-01 MED ORDER — VANCOMYCIN HCL 750 MG/150ML IV SOLN
750.0000 mg | INTRAVENOUS | Status: DC
  Filled 2020-10-01: qty 150

## 2020-10-01 MED ORDER — VANCOMYCIN IV (FOR PTA / DISCHARGE USE ONLY): 750.0000 mg | INTRAVENOUS | 0 refills | Status: AC

## 2020-10-01 NOTE — Progress Notes (Signed)
Pharmacy Antibiotic Note  Thomas [name] is a 69 y.o. male admitted on 08/29/2020 with acute on chronic vertebral infection. Pharmacy has been consulted for vancomycin dosing. Pt to be discharged to SNF today; unfortunately, SNF will not take patient on daptomycin or ceftaroline, though he has had issues with AKI in the past on vancomycin.   Scr stable at 1.77, UOP 1.71ml/kg/hr. Will check trough tomorrow but empirically decrease dose given anticipated level~ 21 and RN can give lower dose while waiting for trough results   Plan: F/u VT 12/14 Empirically decrease vancomycin to 750mg  Q24h  Ceftriaxone and rifampin per ID  Monitor creatinine QFri, vanc levels (goal trough: 15-20 mg/L) Last day 10/30/20   Height: 6\' 7"  (200.7 cm) Weight: 98.7 kg (217 lb 9.5 oz) IBW/kg (Calculated) : 93.7  Temp (24hrs), Avg:98.1 F (36.7 C), Min:97.9 F (36.6 C), Max:98.4 F (36.9 C)  Recent Labs  Lab 09/24/20 1637 09/26/20 0354 09/28/20 1617 09/29/20 0400 09/30/20 0327 10/01/20 0347  WBC  --   --   --   --   --  6.5  CREATININE  --  1.75* 1.69* 1.88* 1.80* 1.77*  VANCOTROUGH 18  --  27*  --   --   --     Estimated Creatinine Clearance: 53.7 mL/min (A) (by C-G formula based on SCr of 1.77 mg/dL (H)).    Allergies  Allergen Reactions  . Other Other (See Comments)    Blood pressure issues Per [facility] hospital: Pt states he can only take these pain meds or else he gets very sick: Oxycontin,morphine,demerol, and dilaudid are the only pain meds pt states he can take.    Marland Kitchen Oxymorphone Other (See Comments)    Causes kidney problems  . Aspirin Nausea And Vomiting    Per [facility]  . Beta Vulgaris Nausea And Vomiting    Beets  . Buspirone Other (See Comments)    unknown  . Cabbage Nausea And Vomiting  . Codeine Other (See Comments)    unknown  . Dolobid [Diflunisal]     Unknown reaction  . Fish Allergy Nausea And Vomiting  . Fish-Derived Products Nausea And Vomiting  . Methadone      Unknown reaction  . Pentazocine Other (See Comments)    Unknown reaction  . Propoxyphene Other (See Comments)    Unknown reaction  . Shellfish Allergy Nausea And Vomiting  . Sulfa Antibiotics Hives  . Sulfasalazine Other (See Comments)    Unknown reaction  . Vistaril [Hydroxyzine]     Unknown reaction  . Amoxicillin Rash    Per [facility]   Antibiotics: Vanc 11/16 >>11/17 Cubicin 11/17>> (12/2) CTX 11/17 >> 12/2, 12/3>>>(10/30/20) Vanc 12/3>> (10/30/20) Rifampin 11/18 >> (10/30/20)  11/18 CK = 33 11/29 CK = 48 12/6 VT: 18 at goal, continue  12/10 VT( drawn 22hr)  27, decr to 1g Q24h  12/14 VT pending,  Microbiology 11/10 BCx - negative 11/11 UCx - negative 11/15 surgical PCR - positive MRSA/MSSA 11/16 T10-11 tissue - negative  Thank you for allowing pharmacy to be a part of this patient's care.  Gillermina Hu, PharmD, BCPS, [facility] Clinical Pharmacist 09/24/20, 18:40 PM

## 2020-10-01 NOTE — Progress Notes (Signed)
   Subjective:   No overnight events. Thomas [name] is resting comfortably with no complaints of pain. Incision is healing well. CW/TOC were notified that patient has not yet received a bed through [facility] SNF for this week and are in touch.  Objective:  Vital signs in last 24 hours: Vitals:   10/01/20 0300 10/01/20 0500 10/01/20 0758 10/01/20 1424  BP: 140/76  138/81 123/83  Pulse: 61  73 64  Resp: 17  17 17   Temp: 97.9 F (36.6 C)  98 F (36.7 C) 98.4 F (36.9 C)  TempSrc: Oral  Oral Oral  SpO2: 98%  100% 96%  Weight:  98.7 kg    Height:       General: Resting comfortably in no acute distress. Neurological: Patient is alert and oriented. Psychiatric: Pleasant and cooperative.  Skin: Midline thoracic incision is healing well, without excessive warmth, erythema, or any drainage.   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 West Bali for monitoring - Dosing of vancomycin per pharmacy  - Oxycontin 30mg  twice daily - Oxycodone to 7.5mg  q4 hours (when awake only) - Gabapentin 600mg  three times daily - Tylenol 1g q8 hours scheduled - Time administration of oxycodone 14mins prior to PT/OT   - Will require additional dose of pain medication prior to ambulance ride  # Hypertension BP improving  today. Likely in setting of improved pain control.   - Continue Metoprolol 100mg  twice daily - Continue hydralazine to 100mg  three times daily - Continue home amlodipine 10mg  daily  - Follow up outpatient  # CKD Stage IV # AKI, resolved Renal function remains stable.   - Continue to trend biweekly BMP's on ABX especially given Vancomycin renal injury in past.  # Opioid-Induced Constipation, Resolved Continues to have regular bowel movements.  - Continue Senokot S, Miralax, Movantik 25mg  daily, Dulcolax suppository, Lactulose BID, Dulcolax suppository. - SMOG enema PRN   Prior to Admission Living Arrangement: Home Anticipated Discharge Location: SNF Barriers to Discharge: Placement  Dispo: Anticipated discharge in approximately 1-2 day(s).   Dr. Jeralyn Bennett Internal Medicine PGY-1  Pager: [phone] After 5pm on weekdays and 1pm on weekends: On Call pager [phone]  10/01/2020, 4:13 PM

## 2020-10-01 NOTE — TOC Progression Note (Addendum)
Transition of Care [facility]) - Progression Note    Patient Details  Name: JULE SCHLABACH MRN: [id number] Date of Birth: [birth year]  Transition of Care [facility]) CM/SW [place], RN Phone Number: 10/01/2020, 9:52 AM  Clinical Narrative:    Case management left a message with Arbie Cookey, CM at [facility] SNF to find out about bed availability and possible transfer to the facility this week for admission.  Will follow up regarding SNF placement - for LTC at the facility.  12/13/2021Arbie Cookey, CM with [facility] does not have an available SNF bed for the patient this week at this point - but will continue to follow the patient for admission.  The patient has not been vaccinated for COVID in the past and declines vaccination and will need a quarantine bed at the facility for safety reasons.   Expected Discharge Plan: [place] Barriers to Discharge: Continued Medical Work up  Expected Discharge Plan and Services Expected Discharge Plan: [place]   Discharge Planning Services: CM Consult Post Acute Care Choice: [place] Living arrangements for the past 2 months: [place] Expected Discharge Date: 09/24/20                                     Social Determinants of Health (SDOH) Interventions    Readmission Risk Interventions Readmission Risk Prevention Plan 09/17/2019  Transportation Screening Complete  PCP or Specialist Appt within 5-7 Days Not Complete  Not Complete comments snf vs home  Home Care Screening Complete  Medication Review (RN CM) Complete  Some recent data might be hidden

## 2020-10-01 NOTE — Progress Notes (Signed)
Physical Therapy Treatment Patient Details Name: Thomas [name] MRN: [id number] DOB: 01/20/1951 Today's Date: 10/01/2020    History of Present Illness 69 year old person living with a right below-knee amputation, chronic pain syndrome due to a complicated history of thoracic spine infections and degenerative disease, deconditioning after recent Covid infection and comorbidities admitted to our service for medical managements of another episode of chronic thoracic osteomyelitis and discitis with degenerative changes causing spinal cord compression. On 11/16, pt underwent surgical decompression around T11, fusion, and disc aspiration for microbiology    PT Comments    Patient with increased agitation this session, however agreeable to PT. Patient declined EOB mobility this session due to increased pain in entire back. Patient agreeable to bed level exercises. Performed bed level exercises with focus on B LE strengthening. Educated patient on importance of OOB mobility and pain reduction, patient declined. Reviewed back precautions with patient throughout session. Patient continues to be limited by pain, generalized weakness, impaired functional mobility, and decreased activity tolerance. Continue to recommend SNF for ongoing Physical Therapy.       Follow Up Recommendations  SNF     Equipment Recommendations  None recommended by PT    Recommendations for Other Services       Precautions / Restrictions Precautions Precautions: Fall;Back Precaution Booklet Issued: Yes (comment) Precaution Comments: reviewed back precs, handout given previously Required Braces or Orthoses: Spinal Brace Spinal Brace: Thoracolumbosacral orthotic Spinal Brace Comments: TLSO when OOB Restrictions Weight Bearing Restrictions: Yes Other Position/Activity Restrictions: hx of R BKA, back precautions    Mobility  Bed Mobility Overal bed mobility: Needs Assistance Bed Mobility: Rolling Rolling: Modified  independent (Device/Increase time)         General bed mobility comments: adamantly declined EOB mobility due to increased pain and agitation  Transfers                    Ambulation/Gait                 Stairs             Wheelchair Mobility    Modified Rankin (Stroke Patients Only)       Balance                                            Cognition Arousal/Alertness: Awake/alert Behavior During Therapy: WFL for tasks assessed/performed Overall Cognitive Status: No family/caregiver present to determine baseline cognitive functioning                                        Exercises General Exercises - Lower Extremity Ankle Circles/Pumps: AROM;Left;10 reps Quad Sets: AROM;Both;10 reps Gluteal Sets: AROM;Both;10 reps Heel Slides: AROM;Left;10 reps Hip ABduction/ADduction: AROM;Both;10 reps Straight Leg Raises: AROM;Both;10 reps Hip Flexion/Marching: AROM;Right;10 reps Other Exercises Other Exercises: single leg bridge x 5    General Comments        Pertinent Vitals/Pain Pain Assessment: Faces Faces Pain Scale: Hurts little more Pain Location: entire back to pelvis Pain Descriptors / Indicators: Grimacing Pain Intervention(s): Monitored during session    Home Living                      Prior Function  PT Goals (current goals can now be found in the care plan section) Acute Rehab PT Goals Patient Stated Goal: pain control, get stronger and get home PT Goal Formulation: With patient Time For Goal Achievement: 10/12/20 Potential to Achieve Goals: Fair Progress towards PT goals: Progressing toward goals    Frequency    Min 3X/week      PT Plan Current plan remains appropriate    Co-evaluation              AM-PAC PT "6 Clicks" Mobility   Outcome Measure  Help needed turning from your back to your side while in a flat bed without using bedrails?: None Help  needed moving from lying on your back to sitting on the side of a flat bed without using bedrails?: A Little Help needed moving to and from a bed to a chair (including a wheelchair)?: Total Help needed standing up from a chair using your arms (e.g., wheelchair or bedside chair)?: Total Help needed to walk in hospital room?: Total Help needed climbing 3-5 steps with a railing? : Total 6 Click Score: 11    End of Session   Activity Tolerance: Patient limited by pain;Treatment limited secondary to agitation Patient left: in bed;with call bell/phone within reach;with bed alarm set Nurse Communication: Mobility status PT Visit Diagnosis: Muscle weakness (generalized) (M62.81);Other abnormalities of gait and mobility (R26.89);Pain Pain - part of body:  (back)     Time: 1700-1749 PT Time Calculation (min) (ACUTE ONLY): 16 min  Charges:  $Therapeutic Exercise: 8-22 mins                     Perrin Maltese, PT, DPT Acute Rehabilitation Services Pager [phone] Office [phone]    Thomas [name] 10/01/2020, 9:45 AM

## 2020-10-01 NOTE — Plan of Care (Signed)
°  Problem: Coping: °Goal: Level of anxiety will decrease °Outcome: Progressing °  °

## 2020-10-02 ENCOUNTER — Inpatient Hospital Stay: Payer: Medicare Other | Admitting: Infectious Diseases

## 2020-10-02 LAB — RESP PANEL BY RT-PCR (FLU A&B, COVID) ARPGX2
Influenza A by PCR: NEGATIVE
Influenza B by PCR: NEGATIVE
SARS Coronavirus 2 by RT PCR: NEGATIVE

## 2020-10-02 LAB — CREATININE, SERUM
Creatinine, Ser: 1.79 mg/dL — ABNORMAL HIGH (ref 0.61–1.24)
GFR, Estimated: 41 mL/min — ABNORMAL LOW (ref >60)

## 2020-10-02 LAB — VANCOMYCIN, TROUGH: Vancomycin Tr: 23 ug/mL (ref 15–20)

## 2020-10-02 MED ORDER — VANCOMYCIN HCL 1250 MG/250ML IV SOLN
1250.0000 mg | INTRAVENOUS | Status: DC
  Administered 2020-10-02: 1250 mg via INTRAVENOUS
  Filled 2020-10-02 (×2): qty 250

## 2020-10-02 MED ORDER — VANCOMYCIN HCL 1250 MG/250ML IV SOLN: 1250.0000 mg | INTRAVENOUS | Status: DC

## 2020-10-02 NOTE — Progress Notes (Signed)
OT Cancellation Note  Patient Details Name: Thomas [name] MRN: [id number] DOB: 1951/05/25   Cancelled Treatment:    Reason Eval/Treat Not Completed: Patient declined, no reason specified;Pain limiting ability to participate;Fatigue/lethargy limiting ability to participate. Coordinated with RN for premedication prior to OT session attempt. On entry, pt resting in bed, difficult to arouse initially. Pt reports still having pain but it is lessened and preferred to "rest while it is working". Educated that while the pain meds are working, that may be the best time to attempt therapy, but pt declines. Pt reports able to complete ADLs at bed level after OT inquiry (can even place bedpan independently) - encouraged progression to EOB ADLs but pt loudly stated, "No". Educated on departmental policy that 3rd consecutive refusal results in discontinuation of attempts with pt verbalizing understanding.   OT signing off at this time. Please reconsult if needs change or pt more agreeable to work with therapies.   Layla Maw 10/02/2020, 12:51 PM

## 2020-10-02 NOTE — Progress Notes (Signed)
   Subjective:   No overnight events. Thomas [name] is resting comfortably, complaining of some back pain although appears to have been sleeping comfortably. He states his back pain occurred after PT came and after doing his exercises, although PT/OT note they were unable to work with him today. CW/TOC were notified that patient has not yet received a bed through [facility] SNF for this week and are in touch.   Objective:  Vital signs in last 24 hours: Vitals:   10/02/20 0506 10/02/20 0823 10/02/20 0854 10/02/20 1300  BP: (!) 147/78  (!) 148/71 (!) 145/74  Pulse: (!) 58 64 73 70  Resp: 18  17 16   Temp: 98 F (36.7 C)  98.3 F (36.8 C) 98.2 F (36.8 C)  TempSrc:   Oral Oral  SpO2: 99%  97% 98%  Weight:      Height:       General: Resting comfortably in no acute distress. Neurological: Patient is lethargic.  Psychiatric: Pleasant and cooperative.  Cardiology: RRR. No murmurs, rubs, or gallops.   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 West Bali for monitoring - Dosing of vancomycin per pharmacy  - Oxycontin 30mg  twice daily - Oxycodone to 7.5mg  q4 hours (when awake only) - Gabapentin 600mg  three times daily - Tylenol 1g q8 hours scheduled - Time administration of oxycodone 43mins prior to PT/OT    - Will require additional dose of pain medication prior to ambulance ride - Will discuss importance of PT/OT compliance with patient and again consult OT for further mobility exercises  # Hypertension BP stably elevated.  - Continue Metoprolol 100mg  twice daily - Continue hydralazine to 100mg  three times daily - Continue home amlodipine 10mg  daily  - Follow up outpatient  # CKD Stage IV # AKI, resolved Renal function remains stable.   - Continue to trend biweekly BMP's on ABX especially given Vancomycin renal injury in past.  Prior to Admission Living Arrangement: Home Anticipated Discharge Location: SNF Barriers to Discharge: Placement  Dispo: Anticipated discharge in approximately 1-2 day(s).   Dr. Jeralyn Bennett Internal Medicine PGY-1  Pager: [phone] After 5pm on weekdays and 1pm on weekends: On Call pager [phone]  10/02/2020, 3:18 PM

## 2020-10-02 NOTE — Progress Notes (Signed)
PT Cancellation Note  Patient Details Name: Thomas [name] MRN: [id number] DOB: 03/07/1951   Cancelled Treatment:    Reason Eval/Treat Not Completed: Patient declined, no reason specified Coordinated with RN about pre-medicating prior to session. Upon arrival, patient sleeping and easily awoken. Patient states "Let me rest while the medicine is working." Educated patient on participating with therapy while pain meds are most effective, patient refuses and states "they don't give me enough pain meds to work with you. I have been up twice since I've been here with OT." Discussed with patient about his length of stay compared to amount of times he has been EOB and risks of immobility. Patient continued to refuse session. PT will re-attempt session as time allows.   Perrin Maltese, PT, DPT Acute Rehabilitation Services Pager [phone] Office [phone]    Alda Lea 10/02/2020, 12:39 PM

## 2020-10-02 NOTE — Progress Notes (Signed)
12/14 0154 RN notified by Lab of vancomycin trough of 23 ug/mL. RN notified pharmacy and pharmacy said to stop administration of next dose.

## 2020-10-02 NOTE — Plan of Care (Signed)
  Problem: Education: Goal: Knowledge of General Education information will improve Description: Including pain rating scale, medication(s)/side effects and non-pharmacologic comfort measures Outcome: Progressing   Problem: Clinical Measurements: Goal: Ability to maintain clinical measurements within normal limits will improve Outcome: Progressing Goal: Will remain free from infection Outcome: Progressing   

## 2020-10-02 NOTE — Progress Notes (Signed)
Pharmacy Antibiotic Note  Thomas [name] is a 69 y.o. male admitted on 08/29/2020 with acute on chronic vertebral infection. Pharmacy has been consulted for vancomycin dosing. Pt to be discharged to SNF today; unfortunately, SNF will not take patient on daptomycin or ceftaroline, though he has had issues with AKI in the past on vancomycin.   Scr stable at 1.79. Vancomycin level 23 mcg/ml (supratherapeutic for goal of 15-20 mcg/ml) on 1000mg  IV q24h. ke 0.02, t 1/2 ~35 hours.  Plan: Change Vancomycin to 1250mg  IV q36h (alternative would be 1750mg  IV q48h if q36h not ok for SNF) Ceftriaxone and rifampin per ID  Monitor creatinine QFri, vanc levels (goal trough: 15-20 mg/L) Last day 10/30/20   Height: 6\' 7"  (200.7 cm) Weight: 98.7 kg (217 lb 9.5 oz) IBW/kg (Calculated) : 93.7  Temp (24hrs), Avg:98.1 F (36.7 C), Min:97.9 F (36.6 C), Max:98.4 F (36.9 C)  Recent Labs  Lab 09/28/20 1617 09/29/20 0400 09/30/20 0327 10/01/20 0347 10/02/20 0127  WBC  --   --   --  6.5  --   CREATININE 1.69* 1.88* 1.80* 1.77* 1.79*  VANCOTROUGH 27*  --   --   --  23*    Estimated Creatinine Clearance: 53.1 mL/min (A) (by C-G formula based on SCr of 1.79 mg/dL (H)).    Allergies  Allergen Reactions  . Other Other (See Comments)    Blood pressure issues Per [facility] hospital: Pt states he can only take these pain meds or else he gets very sick: Oxycontin,morphine,demerol, and dilaudid are the only pain meds pt states he can take.    Marland Kitchen Oxymorphone Other (See Comments)    Causes kidney problems  . Aspirin Nausea And Vomiting    Per [facility]  . Beta Vulgaris Nausea And Vomiting    Beets  . Buspirone Other (See Comments)    unknown  . Cabbage Nausea And Vomiting  . Codeine Other (See Comments)    unknown  . Dolobid [Diflunisal]     Unknown reaction  . Fish Allergy Nausea And Vomiting  . Fish-Derived Products Nausea And Vomiting  . Methadone     Unknown reaction  . Pentazocine  Other (See Comments)    Unknown reaction  . Propoxyphene Other (See Comments)    Unknown reaction  . Shellfish Allergy Nausea And Vomiting  . Sulfa Antibiotics Hives  . Sulfasalazine Other (See Comments)    Unknown reaction  . Vistaril [Hydroxyzine]     Unknown reaction  . Amoxicillin Rash    Per [facility]   Antibiotics: Vanc 11/16 >>11/17 Cubicin 11/17>> (12/2) CTX 11/17 >> 12/2, 12/3>>>(10/30/20) Vanc 12/3>> (10/30/20) Rifampin 11/18 >> (10/30/20)  11/18 CK = 33 11/29 CK = 48 12/6 VT: 18 at goal, continue  12/10 VT( drawn 22hr)  27, decr to 1g Q24h  12/14 VT 23, change to 1250mg  IV q36  Microbiology 11/10 BCx - negative 11/11 UCx - negative 11/15 surgical PCR - positive MRSA/MSSA 11/16 T10-11 tissue - negative  Thank you for allowing pharmacy to be a part of this patient's care.  Sherlon Handing, PharmD, BCPS Please see amion for complete clinical pharmacist phone list 10/02/2020 2:08 AM

## 2020-10-02 NOTE — TOC Progression Note (Signed)
Transition of Care [facility]) - Progression Note    Patient Details  Name: Thomas [name] MRN: [id number] Date of Birth: June 23, 1951  Transition of Care [facility]) CM/SW [place], RN Phone Number: 10/02/2020, 3:25 PM  Clinical Narrative:    Case management spoke with Arbie Cookey, [place] with [facility] in [place], and the patient will be able to discharge to the facility in the morning, 10/03/2020.  The physician will order a COVID screen this afternoon.  I spoke with the patient and he is aware of he discharge tomorrow to the facility.  Case management will continue to follow for discharge to the SNF.   Expected Discharge Plan: [place] Barriers to Discharge: Continued Medical Work up  Expected Discharge Plan and Services Expected Discharge Plan: [place]   Discharge Planning Services: CM Consult Post Acute Care Choice: [place] Living arrangements for the past 2 months: [place] Expected Discharge Date: 09/24/20                                     Social Determinants of Health (SDOH) Interventions    Readmission Risk Interventions Readmission Risk Prevention Plan 09/17/2019  Transportation Screening Complete  PCP or Specialist Appt within 5-7 Days Not Complete  Not Complete comments snf vs home  Home Care Screening Complete  Medication Review (RN CM) Complete  Some recent data might be hidden

## 2020-10-03 DIAGNOSIS — S24104A Unspecified injury at T11-T12 level of thoracic spinal cord, initial encounter: Secondary | ICD-10-CM

## 2020-10-03 DIAGNOSIS — D509 Iron deficiency anemia, unspecified: Secondary | ICD-10-CM

## 2020-10-03 LAB — FUNGUS CULTURE WITH STAIN

## 2020-10-03 LAB — FUNGUS CULTURE RESULT

## 2020-10-03 LAB — FUNGAL ORGANISM REFLEX

## 2020-10-03 MED ORDER — HEPARIN SOD (PORK) LOCK FLUSH 100 UNIT/ML IV SOLN
250.0000 [IU] | INTRAVENOUS | Status: AC | PRN
  Administered 2020-10-03: 250 [IU]
  Filled 2020-10-03: qty 2.5

## 2020-10-03 MED ORDER — HYDROMORPHONE HCL 1 MG/ML IJ SOLN
1.0000 mg | INTRAMUSCULAR | Status: AC | PRN
  Administered 2020-10-03 (×2): 1 mg via INTRAVENOUS
  Filled 2020-10-03 (×2): qty 1

## 2020-10-03 MED ORDER — GABAPENTIN 300 MG PO CAPS: 600.0000 mg | ORAL_CAPSULE | Freq: Three times a day (TID) | ORAL | 0 refills | Status: DC

## 2020-10-03 MED ORDER — OXYCODONE HCL 5 MG PO TABS: 7.5000 mg | ORAL_TABLET | Freq: Three times a day (TID) | ORAL | 0 refills | Status: AC

## 2020-10-03 MED ORDER — OXYCODONE HCL ER 30 MG PO T12A: 30.0000 mg | EXTENDED_RELEASE_TABLET | Freq: Two times a day (BID) | ORAL | 0 refills | Status: AC

## 2020-10-03 MED ORDER — ACETAMINOPHEN 500 MG PO TABS: 1000.0000 mg | ORAL_TABLET | Freq: Three times a day (TID) | ORAL | 0 refills | Status: DC

## 2020-10-03 NOTE — Plan of Care (Signed)
Plan for patient is to be discharged to [facility] SNF in AM 12/15. Pain controlled with prn and scheduled meds. Will continue to monitor and continue current POC.

## 2020-10-03 NOTE — TOC Transition Note (Signed)
Transition of Care [facility]) - CM/SW Discharge Note   Patient Details  Name: Thomas [name] MRN: [id number] Date of Birth: 12/27/1950  Transition [facility] Care [facility] CM/SW Contact:  Curlene Labrum, RN Phone Number: 10/03/2020, 12:01 PM   Clinical Narrative:    Case management spoke with Macario Carls, CM at [facility] in [place], [place] and the patient has an available bed at the facility today for transfer.  I spoke with the patient and brother, BJ and they are both aware of his discharge today to the facility.  I placed discharge clinicals in the hub for [facility] to receive and discharge summary and controlled substance prescriptions are in the chart to transfer with the patient.  PTAR was arranged for 1 pm today for transfer to the facility.  Sharyn Lull, RN will call nursing report to [phone].  Will continue to follow the patient for discharge to the facility.   Final next level of care: [place] Barriers to Discharge: Continued Medical Work up   Patient Goals and CMS Choice Patient states their goals for this hospitalization and ongoing recovery are:: Patient plans to discharge to a skilled nursing facility.  Refuses placement back to [facility]. CMS Medicare.gov Compare Post Acute Care list provided to:: Patient Choice offered to / list presented to : Patient  Discharge Placement                       Discharge Plan and Services   Discharge Planning Services: CM Consult Post Acute Care Choice: [place]                               Social Determinants of Health (SDOH) Interventions     Readmission Risk Interventions Readmission Risk Prevention Plan 09/17/2019  Transportation Screening Complete  PCP or Specialist Appt within 5-7 Days Not Complete  Not Complete comments snf vs home  Home Care Screening Complete  Medication Review (RN CM) Complete  Some recent data might be hidden

## 2020-10-03 NOTE — Care Management Important Message (Signed)
Important Message  Patient Details  Name: Thomas [name] MRN: [id number] Date of Birth: 1951-09-04   Medicare Important Message Given:  Yes     Barb Merino Pullman 10/03/2020, 2:34 PM

## 2020-10-03 NOTE — Progress Notes (Signed)
   Subjective:   No overnight events. Thomas [name] is resting comfortably, complaining of some back pain although appears to have been sleeping comfortably. He has received SNF placement and requests additional pain medication for his ambulance ride.   Objective:  Vital signs in last 24 hours: Vitals:   10/02/20 1955 10/03/20 0413 10/03/20 0500 10/03/20 0756  BP: (!) 166/96 (!) 146/87  139/72  Pulse: 67 64  76  Resp: 17 17  17   Temp: 98.3 F (36.8 C) 98.7 F (37.1 C)  (!) 97.4 F (36.3 C)  TempSrc: Oral Oral  Oral  SpO2: 98% 97%  100%  Weight:   99.8 kg   Height:       General: Resting comfortably in no acute distress. Neurological: Patient is alert and oriented.  Psychiatric: Pleasant and cooperative.  Cardiology: RRR. No murmurs, rubs, or gallops.   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 [facility])   Acute midline low back pain with sciatica   Cord compression (HCC)   Drug-induced constipation   Pain management  # Acute on Chronic Lower Back Pain # Spinal Cord Compression s/p decompression  # Chronic Osteomyelitis / Discitis of T10-T12 s/p fusion T10-T12  - Continue Vancomycin, Ceftriaxone, and PO Rifampin until 10/30/20 via PICC with follow up in ID clinic  - Will give 30 day supply Oxycontin 30mg  twice daily and Oxycodone 7.5mg  q4 hours for discharge, continued here - Gabapentin 600mg  three times daily - Tylenol 1g q8 hours scheduled - Will give Dilaudid 1mg  IV x 2 PRN prior to ride  # Hypertension BP stably elevated.  - Continue Metoprolol 100mg  twice daily - Continue hydralazine to 100mg  three times daily - Continue home amlodipine 10mg  daily  - Follow up outpatient  # CKD Stage IV # AKI, resolved Renal function  remains stable.   - Continue to monitor following discharge on vancomycin   Prior to Admission Living Arrangement: Home Patient discharged to [facility].   Code Status: Full Code  Dr. Jeralyn Bennett Internal Medicine PGY-1  Pager: [phone] After 5pm on weekdays and 1pm on weekends: On Call pager [phone]  10/03/2020, 1:26 PM

## 2020-10-03 NOTE — Progress Notes (Signed)
Report called to Nadara Mustard at [facility]. All questions answered. Pt will be given pain medicine before transport. Pt awaiting PTAR for transport. Pt belongings gathered to be sent with him.

## 2020-10-03 NOTE — Plan of Care (Signed)
  Problem: Health Behavior/Discharge Planning: Goal: Ability to manage health-related needs will improve Outcome: Progressing   Problem: Activity: Goal: Risk for activity intolerance will decrease Outcome: Progressing   Problem: Pain Managment: Goal: General experience of comfort will improve Outcome: Progressing   

## 2020-10-04 ENCOUNTER — Telehealth: Payer: Self-pay

## 2020-10-04 NOTE — Telephone Encounter (Signed)
Thank you Cathie Beams

## 2020-10-04 NOTE — Telephone Encounter (Signed)
Patient discharged from hospital to SNF at [facility]. RN  Calling to clarify lab orders and to confirm that SNF pharmacy can dose vancomycin. Advised nursing staff that PharmD with skilled nursing facility can does vancomycin but to continue to send labs to [facility] fax.   Nursing staff verbalized understanding. Also provided staff with upcoming appointment information.   Routing to MD to made aware.  Eugenia Mcalpine

## 2020-10-05 ENCOUNTER — Emergency Department (HOSPITAL_COMMUNITY): Payer: Medicare Other

## 2020-10-05 ENCOUNTER — Emergency Department (HOSPITAL_COMMUNITY)
Admission: EM | Admit: 2020-10-05 | Discharge: 2020-10-06 | Disposition: A | Discharge status: Home / Self Care | Payer: Medicare Other | Attending: Emergency Medicine | Admitting: Emergency Medicine

## 2020-10-05 ENCOUNTER — Encounter (HOSPITAL_COMMUNITY): Payer: Self-pay

## 2020-10-05 DIAGNOSIS — I130 Hypertensive heart and chronic kidney disease with heart failure and stage 1 through stage 4 chronic kidney disease, or unspecified chronic kidney disease: Secondary | ICD-10-CM | POA: Insufficient documentation

## 2020-10-05 DIAGNOSIS — I509 Heart failure, unspecified: Secondary | ICD-10-CM | POA: Diagnosis not present

## 2020-10-05 DIAGNOSIS — N184 Chronic kidney disease, stage 4 (severe): Secondary | ICD-10-CM | POA: Insufficient documentation

## 2020-10-05 DIAGNOSIS — M549 Dorsalgia, unspecified: Secondary | ICD-10-CM | POA: Insufficient documentation

## 2020-10-05 DIAGNOSIS — E1122 Type 2 diabetes mellitus with diabetic chronic kidney disease: Secondary | ICD-10-CM | POA: Diagnosis not present

## 2020-10-05 DIAGNOSIS — Z20822 Contact with and (suspected) exposure to covid-19: Secondary | ICD-10-CM | POA: Diagnosis not present

## 2020-10-05 DIAGNOSIS — Z79899 Other long term (current) drug therapy: Secondary | ICD-10-CM | POA: Insufficient documentation

## 2020-10-05 DIAGNOSIS — G8929 Other chronic pain: Secondary | ICD-10-CM | POA: Insufficient documentation

## 2020-10-05 LAB — COMPREHENSIVE METABOLIC PANEL
ALT: 10 U/L (ref 0–44)
AST: 19 U/L (ref 15–41)
Albumin: 3.6 g/dL (ref 3.5–5.0)
Alkaline Phosphatase: 61 U/L (ref 38–126)
Anion gap: 11 (ref 5–15)
BUN: 32 mg/dL — ABNORMAL HIGH (ref 8–23)
CO2: 16 mmol/L — ABNORMAL LOW (ref 22–32)
Calcium: 8.9 mg/dL (ref 8.9–10.3)
Chloride: 112 mmol/L — ABNORMAL HIGH (ref 98–111)
Creatinine, Ser: 1.62 mg/dL — ABNORMAL HIGH (ref 0.61–1.24)
GFR, Estimated: 46 mL/min — ABNORMAL LOW (ref >60)
Glucose, Bld: 78 mg/dL (ref 70–99)
Potassium: 5 mmol/L (ref 3.5–5.1)
Sodium: 139 mmol/L (ref 135–145)
Total Bilirubin: 0.6 mg/dL (ref 0.3–1.2)
Total Protein: 7.4 g/dL (ref 6.5–8.1)

## 2020-10-05 LAB — ETHANOL: Alcohol, Ethyl (B): <10 mg/dL (ref <10)

## 2020-10-05 LAB — CBC
HCT: 40 % (ref 39.0–52.0)
Hemoglobin: 12.5 g/dL — ABNORMAL LOW (ref 13.0–17.0)
MCH: 31.6 pg (ref 26.0–34.0)
MCHC: 31.3 g/dL (ref 30.0–36.0)
MCV: 101 fL — ABNORMAL HIGH (ref 80.0–100.0)
Platelets: 146 10*3/uL — ABNORMAL LOW (ref 150–400)
RBC: 3.96 MIL/uL — ABNORMAL LOW (ref 4.22–5.81)
RDW: 16.9 % — ABNORMAL HIGH (ref 11.5–15.5)
WBC: 4.9 10*3/uL (ref 4.0–10.5)
nRBC: 0 % (ref 0.0–0.2)

## 2020-10-05 LAB — RESP PANEL BY RT-PCR (FLU A&B, COVID) ARPGX2
Influenza A by PCR: NEGATIVE
Influenza B by PCR: NEGATIVE
SARS Coronavirus 2 by RT PCR: NEGATIVE

## 2020-10-05 LAB — SALICYLATE LEVEL: Salicylate Lvl: <7 mg/dL — ABNORMAL LOW (ref 7.0–30.0)

## 2020-10-05 IMAGING — CR DG CHEST 1V
2 series · 2 of 2 positions shown · non-contrast
Comparison: Chest x-ray [DATE].

CLINICAL DATA: PICC placement

EXAM:
CHEST  1 VIEW

[x chest ap (1 of 2)]
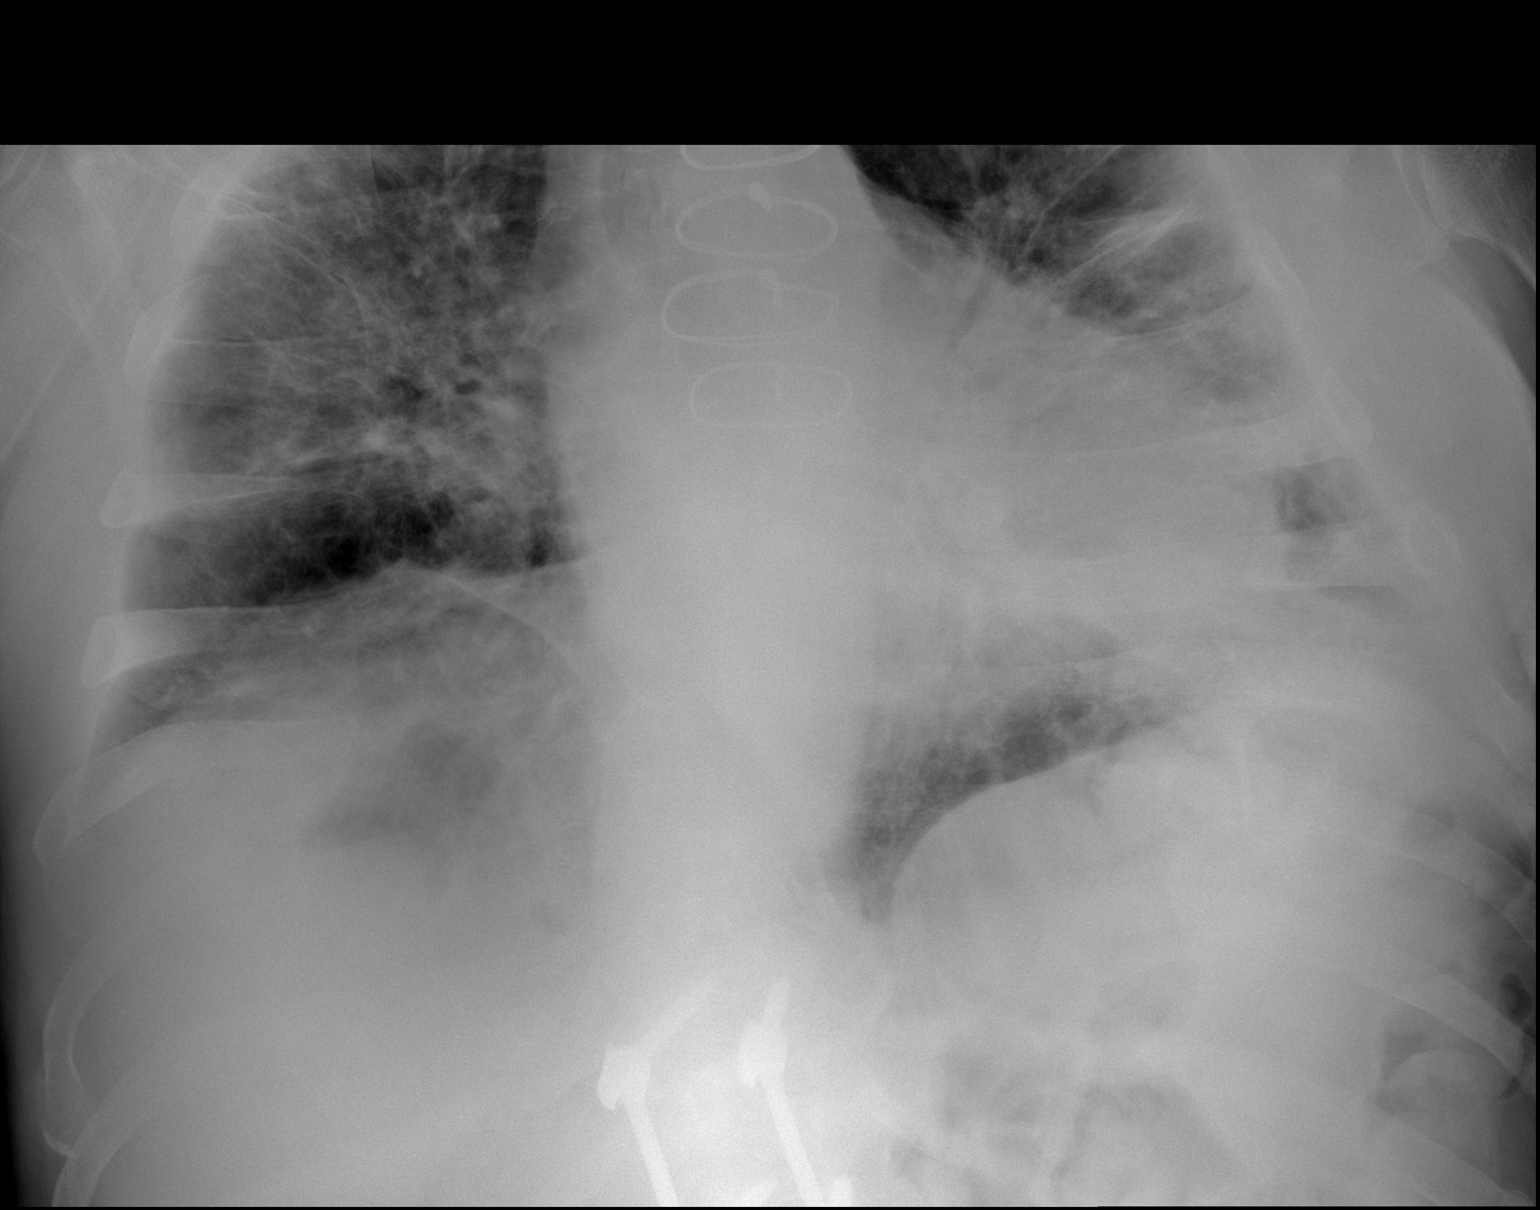

[x chest ap (2 of 2)]
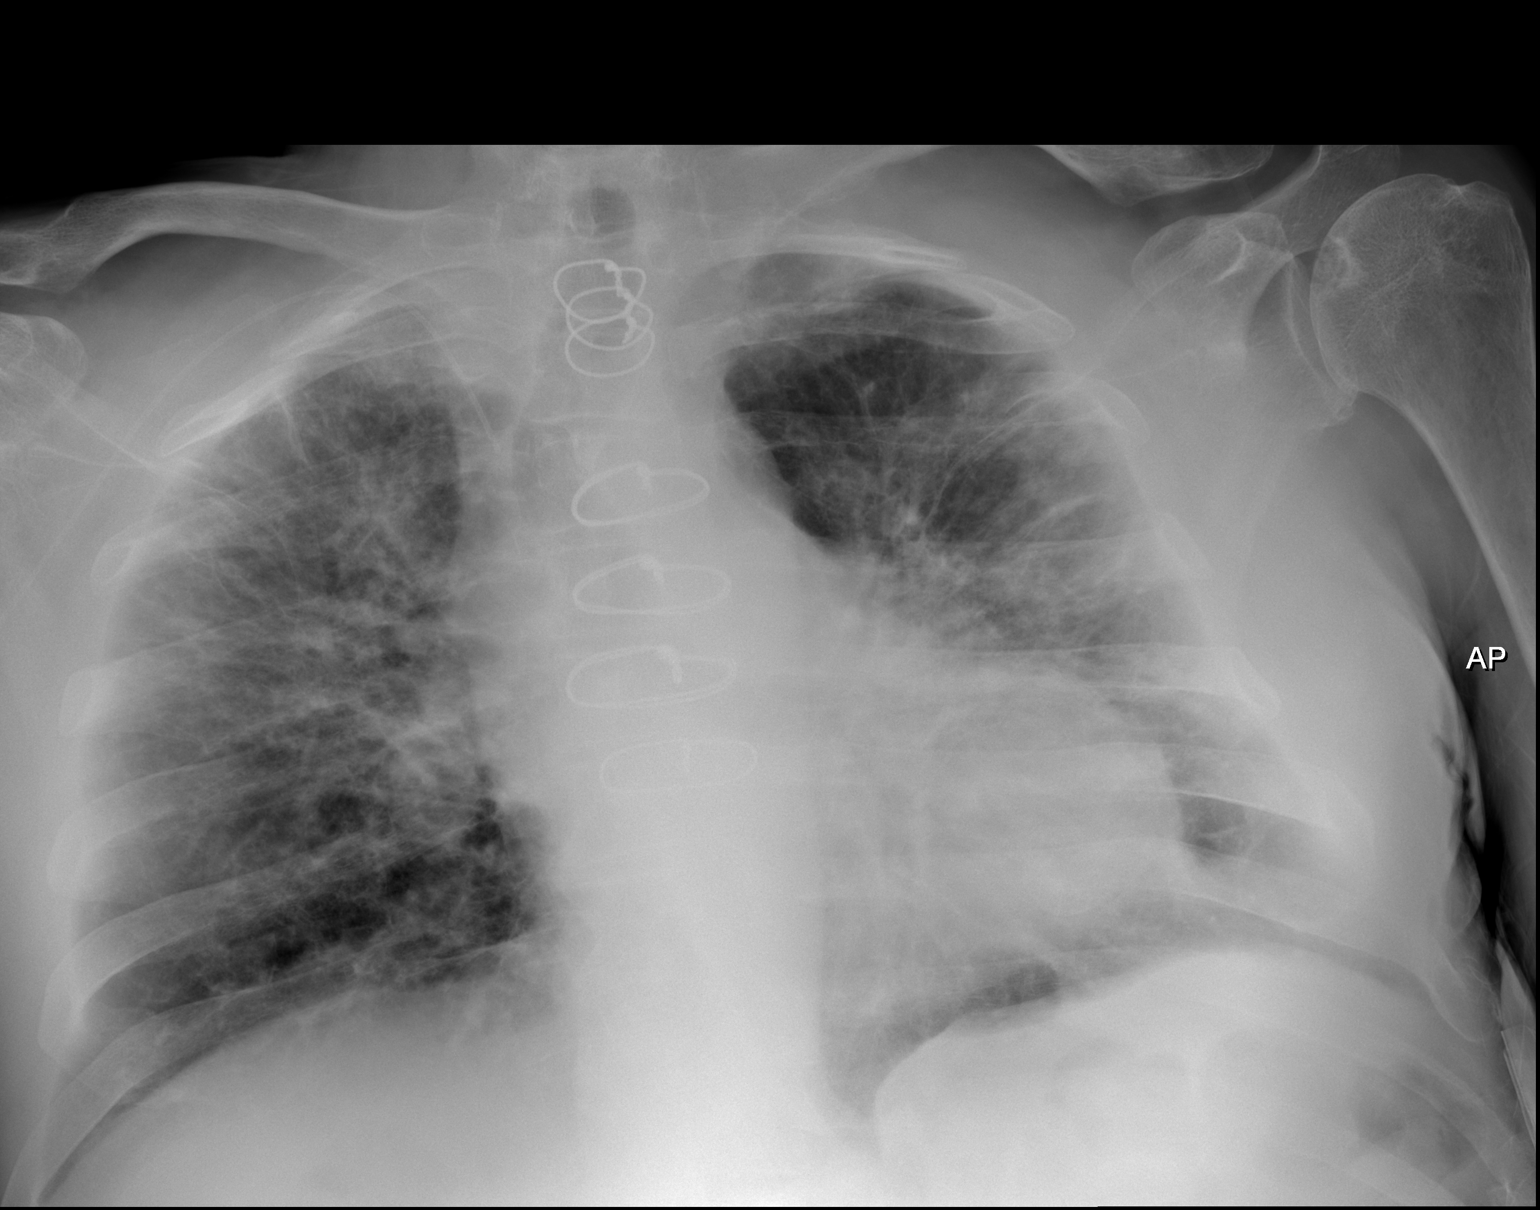

[2 of 2 positions shown; findings below may reference images not displayed]

FINDINGS: Interval placement of a right PICC with tip overlying the expected
region of the proximal superior vena cava.

The heart size and mediastinal contours unchanged.

Persistent multifocal airspace opacities. Coarsened interstitial
markings consistent with known fibrotic changes. No no pleural
effusion. No pneumothorax.

No acute osseous abnormality.
IMPRESSION: 1. Interval placement of a right PICC with tip overlying the
expected region of the proximal superior vena cava. Given low lung
volumes, line may be even more proximal.
2. Low lung volumes with multifocal airspace opacities in a patient
with underlying fibrotic changes.

## 2020-10-05 MED ORDER — SODIUM CHLORIDE 0.9 % IV SOLN
2.0000 g | Freq: Once | INTRAVENOUS | Status: AC
  Administered 2020-10-05: 2 g via INTRAVENOUS
  Filled 2020-10-05: qty 20

## 2020-10-05 MED ORDER — MORPHINE SULFATE (PF) 4 MG/ML IV SOLN
4.0000 mg | Freq: Once | INTRAVENOUS | Status: AC
  Administered 2020-10-05: 4 mg via INTRAVENOUS
  Filled 2020-10-05: qty 1

## 2020-10-05 NOTE — ED Provider Notes (Signed)
Medical screening examination/treatment/procedure(s) were conducted as a shared visit with non-physician practitioner(s) and myself.  I personally evaluated the patient during the encounter.  EKG Interpretation  Date/Time:  Friday October 05 2020 15:44:51 EST Ventricular Rate:  62 PR Interval:  202 QRS Duration: 88 QT Interval:  444 QTC Calculation: 450 R Axis:   17 Text Interpretation: Normal sinus rhythm Normal ECG agree, no change Confirmed by Charlesetta Shanks [phone]) on 10/05/2020 5:25:41 PM  Patient was discharged from the hospital yesterday after being managed for acute on chronic osteomyelitis with multiple medical comorbidities and chronic pain syndrome.  Patient arrived at his nursing home facility yesterday evening.  Today he is very upset because he is not getting any ongoing Dilaudid and does not feel he is being adequately pain controlled.  He reports he told the nursing staff that need rather die than live like this with them giving him only Tylenol.  Patient reports he had no suicidal thoughts in the matter.  He was just angry about not getting adequate pain control.  EMR reviewed.  Patient was supposed to be tapered off of Dilaudid and this was not to be continued upon discharge.   Patient is alert and nontoxic.  Mental status is clear.  No respiratory distress.  I have examined his back, no areas of any superficial appearance of infection or abnormality.  No active wounds on the back or buttocks.  All is well-healed with skin surfaces intact.  Patient has a amputation of the right lower extremity.  The stump is in good condition.  No peripheral edema on the left lower extremity.  Patient can move in the stretcher using all 4 extremities at well.  He has a PICC line in the right arm.  This is clean without any erythema around the entrance site it is cleanly dressed.  Patient presents as outlined less than 24 hours after leaving the hospital.  After discussing the circumstance with  the patient, there is no indication that he is suicidal.  He is angry about not getting what he considers appropriate pain control.  I counseled that this is a function of what his treating physicians and primary physician thinks is appropriate.  Review of EMR indicates that patient does have long history of chronic pain and clearly indicated the plan to discontinue Dilaudid and manage narcotics judiciously.  I find no indication that patient has any acute medical changes since his discharge yesterday.  Patient is not suicidal.  Patient stable for return to nursing home care.   Charlesetta Shanks, MD 10/05/20 Drema Halon

## 2020-10-05 NOTE — ED Notes (Signed)
PTAR contacted for transport back to [facility] SN[facility]/[place]. Patient has been discharged
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
## 2020-10-05 NOTE — ED Triage Notes (Addendum)
Pt arrived via EMS, pt from [facility], staff called with concerned for SI. Pt states he did say he "don't want to live like this" pt states that statement was the response to being given tylenol and stated his pain was not being controlled. Pt states recent back surgery and he has only been given tylenol since. Previously on dilaudid and OxyContin cont. Denies any active SI, states he said that in frustration to be offered tylenol. Offers no other complaints.

## 2020-10-05 NOTE — Discharge Instructions (Addendum)
You were sent to the emergency department due to concerns of suicidal ideations.  On speaking with you have no suicidal thoughts, suicide plans or homicidal ideations.  It appears that you are unhappy with your pain management control which led to your comments earlier today but that you had no intention or plan to commit suicide.  Please continue to take your pain medication as prescribed, refusing or missing doses will lead to worse pain.  If you have concerns for further pain management please reach out to neurosurgery physician that completed your most recent surgery.    You are physical exam was reassuring that there was no acute injury or new infectious process going on with your back at this time.  Your lab work was reassuring.    While you are in the hospital we did give you your scheduled dose of ceftriaxone because we spoke with the hall manager at [facility] and they reported you had only received her vancomycin today.  You refused any of your pain medication while in the hospital with [place].  Not administer any of your other medications.  [facility] you should receive any scheduled medications that you may have missed earlier today or already taking.  Get help right away if: You have weakness or numbness in one or both of your legs or feet. You have trouble controlling your bladder or your bowels. You have nausea or vomiting. You have pain in your abdomen. You have shortness of breath or you faint.

## 2020-10-06 NOTE — ED Notes (Signed)
[facility] arrived and picked patient up to transport back to [facility]. [facility] did not let the RN know they were here. Patient left prior to RN getting a last set of vital signs

## 2020-10-24 ENCOUNTER — Inpatient Hospital Stay: Payer: Medicare Other | Admitting: Infectious Diseases

## 2020-12-28 ENCOUNTER — Other Ambulatory Visit: Payer: Self-pay | Admitting: Internal Medicine

## 2021-06-02 ENCOUNTER — Emergency Department (HOSPITAL_COMMUNITY): Payer: Medicare Other

## 2021-06-02 ENCOUNTER — Other Ambulatory Visit: Payer: Self-pay

## 2021-06-02 ENCOUNTER — Inpatient Hospital Stay (HOSPITAL_COMMUNITY)
Admission: EM | Admit: 2021-06-02 | Discharge: 2021-06-06 | DRG: 684 | Disposition: A | Discharge status: Skilled Nursing Facility | Payer: Medicare Other | Attending: Family Medicine | Admitting: Family Medicine

## 2021-06-02 DIAGNOSIS — Z882 Allergy status to sulfonamides status: Secondary | ICD-10-CM

## 2021-06-02 DIAGNOSIS — Z79890 Hormone replacement therapy (postmenopausal): Secondary | ICD-10-CM | POA: Diagnosis not present

## 2021-06-02 DIAGNOSIS — N472 Paraphimosis: Secondary | ICD-10-CM | POA: Diagnosis present

## 2021-06-02 DIAGNOSIS — R339 Retention of urine, unspecified: Secondary | ICD-10-CM | POA: Diagnosis present

## 2021-06-02 DIAGNOSIS — Z9114 Patient's other noncompliance with medication regimen: Secondary | ICD-10-CM

## 2021-06-02 DIAGNOSIS — Z8673 Personal history of transient ischemic attack (TIA), and cerebral infarction without residual deficits: Secondary | ICD-10-CM

## 2021-06-02 DIAGNOSIS — R338 Other retention of urine: Secondary | ICD-10-CM | POA: Diagnosis present

## 2021-06-02 DIAGNOSIS — E039 Hypothyroidism, unspecified: Secondary | ICD-10-CM | POA: Diagnosis present

## 2021-06-02 DIAGNOSIS — R5381 Other malaise: Secondary | ICD-10-CM | POA: Diagnosis not present

## 2021-06-02 DIAGNOSIS — Z91013 Allergy to seafood: Secondary | ICD-10-CM

## 2021-06-02 DIAGNOSIS — E119 Type 2 diabetes mellitus without complications: Secondary | ICD-10-CM

## 2021-06-02 DIAGNOSIS — Z20822 Contact with and (suspected) exposure to covid-19: Secondary | ICD-10-CM | POA: Diagnosis present

## 2021-06-02 DIAGNOSIS — D638 Anemia in other chronic diseases classified elsewhere: Secondary | ICD-10-CM | POA: Diagnosis present

## 2021-06-02 DIAGNOSIS — E669 Obesity, unspecified: Secondary | ICD-10-CM | POA: Diagnosis present

## 2021-06-02 DIAGNOSIS — N179 Acute kidney failure, unspecified: Principal | ICD-10-CM | POA: Diagnosis present

## 2021-06-02 DIAGNOSIS — Z885 Allergy status to narcotic agent status: Secondary | ICD-10-CM | POA: Diagnosis not present

## 2021-06-02 DIAGNOSIS — Z79899 Other long term (current) drug therapy: Secondary | ICD-10-CM

## 2021-06-02 DIAGNOSIS — N184 Chronic kidney disease, stage 4 (severe): Secondary | ICD-10-CM | POA: Diagnosis present

## 2021-06-02 DIAGNOSIS — E875 Hyperkalemia: Secondary | ICD-10-CM | POA: Diagnosis present

## 2021-06-02 DIAGNOSIS — R21 Rash and other nonspecific skin eruption: Secondary | ICD-10-CM | POA: Diagnosis present

## 2021-06-02 DIAGNOSIS — Z888 Allergy status to other drugs, medicaments and biological substances status: Secondary | ICD-10-CM

## 2021-06-02 DIAGNOSIS — G894 Chronic pain syndrome: Secondary | ICD-10-CM | POA: Diagnosis present

## 2021-06-02 DIAGNOSIS — E1169 Type 2 diabetes mellitus with other specified complication: Secondary | ICD-10-CM | POA: Diagnosis not present

## 2021-06-02 DIAGNOSIS — Z6834 Body mass index (BMI) 34.0-34.9, adult: Secondary | ICD-10-CM | POA: Diagnosis not present

## 2021-06-02 DIAGNOSIS — G8929 Other chronic pain: Secondary | ICD-10-CM

## 2021-06-02 DIAGNOSIS — I1310 Hypertensive heart and chronic kidney disease without heart failure, with stage 1 through stage 4 chronic kidney disease, or unspecified chronic kidney disease: Secondary | ICD-10-CM | POA: Diagnosis present

## 2021-06-02 DIAGNOSIS — E785 Hyperlipidemia, unspecified: Secondary | ICD-10-CM | POA: Diagnosis present

## 2021-06-02 DIAGNOSIS — Z89511 Acquired absence of right leg below knee: Secondary | ICD-10-CM

## 2021-06-02 DIAGNOSIS — Z794 Long term (current) use of insulin: Secondary | ICD-10-CM

## 2021-06-02 DIAGNOSIS — L8990 Pressure ulcer of unspecified site, unspecified stage: Secondary | ICD-10-CM | POA: Insufficient documentation

## 2021-06-02 DIAGNOSIS — E1122 Type 2 diabetes mellitus with diabetic chronic kidney disease: Secondary | ICD-10-CM | POA: Diagnosis present

## 2021-06-02 LAB — BLOOD GAS, ARTERIAL
Acid-base deficit: 7.2 mmol/L — ABNORMAL HIGH (ref 0.0–2.0)
Bicarbonate: 17.4 mmol/L — ABNORMAL LOW (ref 20.0–28.0)
Drawn by: 332471
FIO2: 26
O2 Content: 1.5 L/min
O2 Saturation: 98.9 %
Patient temperature: 98.6
pCO2 arterial: 33.2 mmHg (ref 32.0–48.0)
pH, Arterial: 7.338 — ABNORMAL LOW (ref 7.350–7.450)
pO2, Arterial: 119 mmHg — ABNORMAL HIGH (ref 83.0–108.0)

## 2021-06-02 LAB — CBC WITH DIFFERENTIAL/PLATELET
Abs Immature Granulocytes: 0.03 10*3/uL (ref 0.00–0.07)
Basophils Absolute: 0 10*3/uL (ref 0.0–0.1)
Basophils Relative: 0 %
Eosinophils Absolute: 0.3 10*3/uL (ref 0.0–0.5)
Eosinophils Relative: 4 %
HCT: 29.6 % — ABNORMAL LOW (ref 39.0–52.0)
Hemoglobin: 9.6 g/dL — ABNORMAL LOW (ref 13.0–17.0)
Immature Granulocytes: 1 %
Lymphocytes Relative: 12 %
Lymphs Abs: 0.7 10*3/uL (ref 0.7–4.0)
MCH: 32.4 pg (ref 26.0–34.0)
MCHC: 32.4 g/dL (ref 30.0–36.0)
MCV: 100 fL (ref 80.0–100.0)
Monocytes Absolute: 0.4 10*3/uL (ref 0.1–1.0)
Monocytes Relative: 6 %
Neutro Abs: 4.9 10*3/uL (ref 1.7–7.7)
Neutrophils Relative %: 77 %
Platelets: 142 10*3/uL — ABNORMAL LOW (ref 150–400)
RBC: 2.96 MIL/uL — ABNORMAL LOW (ref 4.22–5.81)
RDW: 14.3 % (ref 11.5–15.5)
WBC: 6.3 10*3/uL (ref 4.0–10.5)
nRBC: 0 % (ref 0.0–0.2)

## 2021-06-02 LAB — COMPREHENSIVE METABOLIC PANEL
ALT: 19 U/L (ref 0–44)
AST: 22 U/L (ref 15–41)
Albumin: 3.3 g/dL — ABNORMAL LOW (ref 3.5–5.0)
Alkaline Phosphatase: 97 U/L (ref 38–126)
Anion gap: 10 (ref 5–15)
BUN: 49 mg/dL — ABNORMAL HIGH (ref 8–23)
CO2: 17 mmol/L — ABNORMAL LOW (ref 22–32)
Calcium: 7.6 mg/dL — ABNORMAL LOW (ref 8.9–10.3)
Chloride: 111 mmol/L (ref 98–111)
Creatinine, Ser: 3.74 mg/dL — ABNORMAL HIGH (ref 0.61–1.24)
GFR, Estimated: 17 mL/min — ABNORMAL LOW (ref >60)
Glucose, Bld: 173 mg/dL — ABNORMAL HIGH (ref 70–99)
Potassium: 5 mmol/L (ref 3.5–5.1)
Sodium: 138 mmol/L (ref 135–145)
Total Bilirubin: 0.6 mg/dL (ref 0.3–1.2)
Total Protein: 6.9 g/dL (ref 6.5–8.1)

## 2021-06-02 LAB — URINALYSIS, ROUTINE W REFLEX MICROSCOPIC
Bilirubin Urine: NEGATIVE
Glucose, UA: NEGATIVE mg/dL
Ketones, ur: NEGATIVE mg/dL
Nitrite: NEGATIVE
Protein, ur: >300 mg/dL — AB
Specific Gravity, Urine: 1.025 (ref 1.005–1.030)
pH: 6 (ref 5.0–8.0)

## 2021-06-02 LAB — RESP PANEL BY RT-PCR (FLU A&B, COVID) ARPGX2
Influenza A by PCR: NEGATIVE
Influenza B by PCR: NEGATIVE
SARS Coronavirus 2 by RT PCR: NEGATIVE

## 2021-06-02 LAB — URINALYSIS, MICROSCOPIC (REFLEX): WBC, UA: >50 WBC/hpf (ref 0–5)

## 2021-06-02 LAB — CBG MONITORING, ED
Glucose-Capillary: 143 mg/dL — ABNORMAL HIGH (ref 70–99)
Glucose-Capillary: 132 mg/dL — ABNORMAL HIGH (ref 70–99)

## 2021-06-02 IMAGING — DX DG CHEST 1V PORT
1 series · 1 of 1 positions shown · non-contrast
Comparison: [DATE]

CLINICAL DATA: Weakness.  Urination retention.

EXAM:
PORTABLE CHEST 1 VIEW

[chest ap]
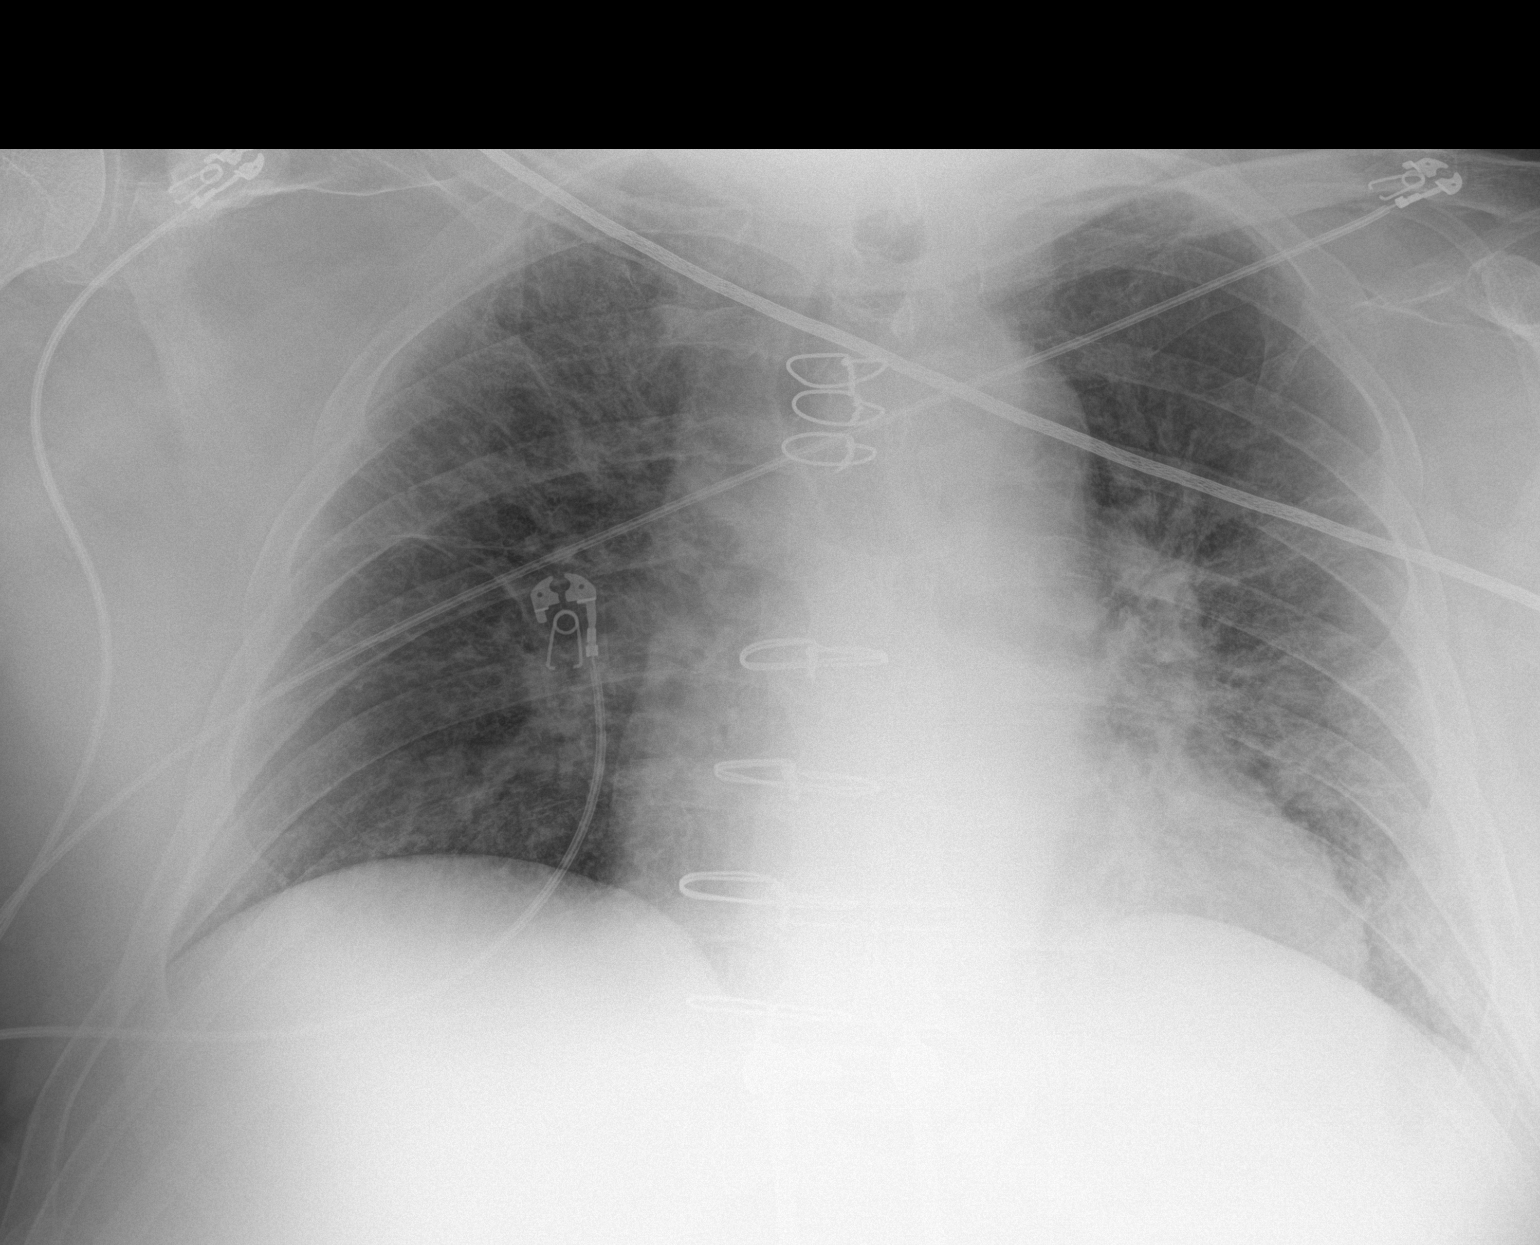

[1 of 1 positions shown; findings below may reference images not displayed]

FINDINGS: Cardiomegaly. The hila and mediastinum are unchanged. No
pneumothorax. Infiltrate in the left mid lower lung. Mild
interstitial prominence in the right upper lobe. No other acute
abnormalities.
IMPRESSION: Bilateral pulmonary opacities are identified. The appearance in the
left base is most suggestive of pneumonia. Asymmetric edema is a
possibility.

## 2021-06-02 MED ORDER — ACETAMINOPHEN 325 MG PO TABS
650.0000 mg | ORAL_TABLET | Freq: Four times a day (QID) | ORAL | Status: DC | PRN
  Administered 2021-06-03 – 2021-06-05 (×7): 650 mg via ORAL
  Filled 2021-06-02 (×7): qty 2

## 2021-06-02 MED ORDER — INSULIN ASPART 100 UNIT/ML IJ SOLN
0.0000 [IU] | Freq: Every day | INTRAMUSCULAR | Status: DC
  Filled 2021-06-02: qty 0.05

## 2021-06-02 MED ORDER — HYDROMORPHONE HCL 1 MG/ML IJ SOLN
1.0000 mg | Freq: Once | INTRAMUSCULAR | Status: AC
  Administered 2021-06-02: 1 mg via INTRAVENOUS
  Filled 2021-06-02: qty 1

## 2021-06-02 MED ORDER — GABAPENTIN 300 MG PO CAPS
300.0000 mg | ORAL_CAPSULE | Freq: Every day | ORAL | Status: DC
  Administered 2021-06-03 – 2021-06-05 (×3): 300 mg via ORAL
  Filled 2021-06-02 (×4): qty 1

## 2021-06-02 MED ORDER — SENNOSIDES-DOCUSATE SODIUM 8.6-50 MG PO TABS: 1.0000 | ORAL_TABLET | Freq: Every evening | ORAL | Status: DC | PRN

## 2021-06-02 MED ORDER — CYANOCOBALAMIN 500 MCG PO TABS
250.0000 ug | ORAL_TABLET | Freq: Every day | ORAL | Status: DC
  Administered 2021-06-03 – 2021-06-06 (×4): 250 ug via ORAL
  Filled 2021-06-02 (×4): qty 1

## 2021-06-02 MED ORDER — SODIUM CHLORIDE 0.9 % IV SOLN
1.0000 g | Freq: Once | INTRAVENOUS | Status: AC
  Administered 2021-06-02: 1 g via INTRAVENOUS
  Filled 2021-06-02: qty 10

## 2021-06-02 MED ORDER — HEPARIN SODIUM (PORCINE) 5000 UNIT/ML IJ SOLN
5000.0000 [IU] | Freq: Three times a day (TID) | INTRAMUSCULAR | Status: DC
  Administered 2021-06-03 – 2021-06-06 (×12): 5000 [IU] via SUBCUTANEOUS
  Filled 2021-06-02 (×12): qty 1

## 2021-06-02 MED ORDER — ONDANSETRON HCL 4 MG PO TABS: 4.0000 mg | ORAL_TABLET | Freq: Four times a day (QID) | ORAL | Status: DC | PRN

## 2021-06-02 MED ORDER — LEVOTHYROXINE SODIUM 50 MCG PO TABS
50.0000 ug | ORAL_TABLET | Freq: Every day | ORAL | Status: DC
  Administered 2021-06-03 – 2021-06-06 (×4): 50 ug via ORAL
  Filled 2021-06-02 (×4): qty 1

## 2021-06-02 MED ORDER — METHOCARBAMOL 500 MG PO TABS
750.0000 mg | ORAL_TABLET | Freq: Three times a day (TID) | ORAL | Status: DC | PRN
  Administered 2021-06-03 – 2021-06-05 (×4): 750 mg via ORAL
  Filled 2021-06-02 (×4): qty 2

## 2021-06-02 MED ORDER — ONDANSETRON HCL 4 MG/2ML IJ SOLN: 4.0000 mg | Freq: Four times a day (QID) | INTRAMUSCULAR | Status: DC | PRN

## 2021-06-02 MED ORDER — SODIUM CHLORIDE 0.9 % IV SOLN: INTRAVENOUS | Status: DC

## 2021-06-02 MED ORDER — TAMSULOSIN HCL 0.4 MG PO CAPS
0.4000 mg | ORAL_CAPSULE | Freq: Every day | ORAL | Status: DC
  Administered 2021-06-03 – 2021-06-06 (×4): 0.4 mg via ORAL
  Filled 2021-06-02 (×5): qty 1

## 2021-06-02 MED ORDER — INSULIN ASPART 100 UNIT/ML IJ SOLN
0.0000 [IU] | Freq: Three times a day (TID) | INTRAMUSCULAR | Status: DC
  Administered 2021-06-02: 2 [IU] via SUBCUTANEOUS
  Administered 2021-06-03 – 2021-06-05 (×3): 3 [IU] via SUBCUTANEOUS
  Administered 2021-06-05: 2 [IU] via SUBCUTANEOUS
  Administered 2021-06-06 (×2): 3 [IU] via SUBCUTANEOUS
  Filled 2021-06-02: qty 0.15

## 2021-06-02 MED ORDER — ACETAMINOPHEN 650 MG RE SUPP: 650.0000 mg | Freq: Four times a day (QID) | RECTAL | Status: DC | PRN

## 2021-06-02 NOTE — ED Provider Notes (Signed)
[facility] DEPT Provider Note   CSN: [id number] Arrival date & time: 06/02/21  0920     History Chief Complaint  Patient presents with   Urinary Retention    Thomas [name] is a 70 y.o. male.  HPI Patient presents from home by EMS for evaluation.  He has a convoluted history.  He reports being unable to get out of his chair for the last 24 hours.  He was apparently hospitalized at [facility], and discharged, 2 days ago.  He states he has not had any his medicines for a day.  He has a right lower leg amputation, does not wear prosthesis, and does not have a wheelchair at his domicile.  He states he "cannot get it in the house."  He has had a rash on his body for about a week.  It is not clear what has been done for that or what it was caused by.  His recent hospitalization was preceded by a rehab stay for at least 30 days.  He had to go to the hospital, 2 days after rehab discharge, for "shortness of breath."  He states he is out of his pain medicine and uses them chronically.  He has been prescribed short and long-acting oxycodone, but has not had prescriptions filled, within the last month.  He states he has been unable to get to his pain doctor for that.  He denies fever, chills, nausea or vomiting.  He states he is having trouble urinating because his penis is enlarged, and he has trouble getting into the urinal.  There are no other known modifying factors.    Past Medical History:  Diagnosis Date   Arthritis    BPH (benign prostatic hyperplasia) 11/24/2019   Chronic pain syndrome 11/24/2019   CKD (chronic kidney disease), stage IV ([place]) 11/24/2019   Diabetes mellitus without complication (HCC)    Gout    Hypertension    Muscle pain    Physical deconditioning 11/24/2019   Scars    Swelling     Patient Active Problem List   Diagnosis Date Noted   Pain management    Cord compression ([place])    Drug-induced constipation    Acute midline low back  pain with sciatica    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 ([place]) 08/30/2020   Urinary retention 12/04/2019   BPH (benign prostatic hyperplasia) 11/24/2019   CKD (chronic kidney disease), stage IV ([place]) 11/24/2019   Chronic pain syndrome 11/24/2019   Physical deconditioning 11/24/2019   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   Pulmonary emboli ([place]) 09/29/2018   [name], right ([place]) 09/29/2018   [name], type 2) ([place]) 09/29/2018   Gout 09/29/2018   HTN (hypertension) 09/29/2018   CHF (congestive heart failure), NYHA class I, chronic, diastolic ([place]) [id number]   GERD (gastroesophageal reflux disease) 09/29/2018    Past Surgical History:  Procedure Laterality Date   BELOW KNEE LEG AMPUTATION Right    ESOPHAGOGASTRODUODENOSCOPY (EGD) WITH PROPOFOL N/A 11/25/2019   Procedure: ESOPHAGOGASTRODUODENOSCOPY (EGD) WITH PROPOFOL;  Surgeon: Carol Ada, MD;  Location: [facility];  Service: Endoscopy;  Laterality: N/A;   IR FLUORO GUIDE CV LINE RIGHT  09/05/2019   IR LUMBAR DISC ASPIRATION W/IMG GUIDE  11/26/2019   IR US GUIDE VASC ACCESS RIGHT  09/05/2019   RADIOLOGY WITH ANESTHESIA N/A 11/25/2019   Procedure: Thoracic and Lumbar MRI WITH ANESTHESIA;  Surgeon: Radiologist, Medication, MD;  Location: [address];  Service:  Radiology;  Laterality: N/A;       Family History  Family history unknown: Yes    Social History   Tobacco Use   Smoking status: Never   Smokeless tobacco: Never  Vaping Use   Vaping Use: Never used  Substance Use Topics   Alcohol use: No   Drug use: No    Home Medications Prior to Admission medications   Medication Sig Start Date End Date Taking? Authorizing Provider  acetaminophen (TYLENOL) 500 MG tablet Take 2 tablets (1,000 mg total) by mouth every 8 (eight) hours. 10/03/20   Jeralyn Bennett, MD  allopurinol (ZYLOPRIM) 100 MG tablet Take 100 mg by mouth daily.     [provider]  amLODipine (NORVASC) 10 MG tablet  Take 1 tablet by mouth daily.     [provider]  amLODipine (NORVASC) 5 MG tablet Take 5 mg by mouth daily. 05/30/21   [provider]  atorvastatin (LIPITOR) 80 MG tablet Take 1 tablet (80 mg total) by mouth daily. 09/25/20   Jose Persia, MD  cholecalciferol (VITAMIN D) 25 MCG tablet Take 1 tablet (1,000 Units total) by mouth daily. 09/25/20   Jose Persia, MD  cloNIDine (CATAPRES) 0.2 MG tablet Take 0.2 mg by mouth 3 (three) times daily. 05/31/21   [provider]  colchicine 0.6 MG tablet Take 0.6 mg by mouth daily as needed (gout flare).     [provider]  Cyanocobalamin (VITAMIN B-12) 5000 MCG TBDP Take 250 mcg by mouth daily.     [provider]  finasteride (PROSCAR) 5 MG tablet Take 1 tablet (5 mg total) by mouth daily. 10/10/19   Hosie Poisson, MD  furosemide (LASIX) 40 MG tablet Take 40 mg by mouth daily as needed for fluid.  02/06/20   [provider]  gabapentin (NEURONTIN) 300 MG capsule Take 1 capsule (300 mg total) by mouth 3 (three) times daily. 09/24/20   Jose Persia, MD  gabapentin (NEURONTIN) 300 MG capsule Take 2 capsules (600 mg total) by mouth 3 (three) times daily. 10/03/20 11/02/20  Jeralyn Bennett, MD  hydrALAZINE (APRESOLINE) 100 MG tablet Take 1 tablet (100 mg total) by mouth 3 (three) times daily. 09/24/20   Jose Persia, MD  iron polysaccharides (NIFEREX) 150 MG capsule Take 1 capsule (150 mg total) by mouth daily. 09/25/20   Jose Persia, MD  lactulose (CHRONULAC) 10 GM/15ML solution Take 15 mLs (10 g total) by mouth 2 (two) times daily. 09/24/20   Jose Persia, MD  levothyroxine (SYNTHROID) 50 MCG tablet Take 50 mcg by mouth daily before breakfast.     [provider]  methocarbamol (ROBAXIN) 750 MG tablet Take 1 tablet (750 mg total) by mouth 3 (three) times daily. 09/24/20   Jose Persia, MD  metoprolol tartrate (LOPRESSOR) 100 MG tablet Take 1 tablet (100 mg total) by mouth 2 (two) times  daily. 09/24/20   Jose Persia, MD  naloxegol oxalate (MOVANTIK) 25 MG TABS tablet Take 1 tablet (25 mg total) by mouth daily. 09/25/20   Jose Persia, MD  OXYCONTIN 30 MG 12 hr tablet Take 30 mg by mouth 2 (two) times daily. 05/16/21   [provider]  polyethylene glycol (MIRALAX / GLYCOLAX) 17 g packet Take 17 g by mouth 2 (two) times daily. 09/24/20   Jose Persia, MD  senna-docusate (SENOKOT-S) 8.6-50 MG tablet Take 2 tablets by mouth 2 (two) times daily. 09/24/20   Jose Persia, MD  tamsulosin (FLOMAX) 0.4 MG CAPS capsule Take 1 capsule (0.4  mg total) by mouth daily. 09/25/20   Jose Persia, MD  vitamin B-12 (CYANOCOBALAMIN) 250 MCG tablet Take 250 mcg by mouth daily. 05/12/21   [provider]  ZOFRAN 4 MG tablet Take 4 mg by mouth every 8 (eight) hours as needed for nausea or vomiting.  02/06/20   [provider]    Allergies    Other, Oxymorphone, Aspirin, Beta vulgaris, Buspirone, Cabbage, Codeine, Dolobid [diflunisal], Fish allergy, Fish-derived products, Methadone, Pentazocine, Propoxyphene, Shellfish allergy, Sulfa antibiotics, Sulfasalazine, Vistaril [hydroxyzine], and Amoxicillin  Review of Systems   Review of Systems  All other systems reviewed and are negative.  Physical Exam Updated Vital Signs BP (!) 145/78   Pulse (!) 55   Temp 98.3 F (36.8 C) (Oral)   Resp 18   Ht '6\' 6"'$  (1.981 m)   Wt (!) 137 kg   SpO2 98%   BMI 34.90 kg/m   Physical Exam Vitals and nursing note reviewed.  Constitutional:      Appearance: He is well-developed. He is not ill-appearing.  HENT:     Head: Normocephalic and atraumatic.     Right Ear: External ear normal.     Left Ear: External ear normal.  Eyes:     Conjunctiva/sclera: Conjunctivae normal.     Pupils: Pupils are equal, round, and reactive to light.  Neck:     Trachea: Phonation normal.  Cardiovascular:     Rate and Rhythm: Normal rate and regular rhythm.     Heart sounds: Normal heart  sounds.  Pulmonary:     Effort: Pulmonary effort is normal.     Breath sounds: Normal breath sounds.  Abdominal:     Palpations: Abdomen is soft.     Tenderness: There is no abdominal tenderness.  Musculoskeletal:        General: Normal range of motion.     Cervical back: Normal range of motion and neck supple.  Skin:    General: Skin is warm and dry.     Comments: Generalized red rash, and patches approximately 3 to 5 cm, confluent erythema of the penis/scrotum and perineal area noted.  Rash is nonspecific in appearance.  There are other areas of excoriations, on the arms and legs bilaterally.  No excoriations are less than 1 cm.  Neurological:     Mental Status: He is alert and oriented to person, place, and time.     Cranial Nerves: No cranial nerve deficit.     Sensory: No sensory deficit.     Motor: No abnormal muscle tone.     Coordination: Coordination normal.  Psychiatric:        Mood and Affect: Mood normal.        Behavior: Behavior normal.        Thought Content: Thought content normal.        Judgment: Judgment normal.    ED Results / Procedures / Treatments   Labs (all labs ordered are listed, but only abnormal results are displayed) Labs Reviewed  COMPREHENSIVE METABOLIC PANEL - Abnormal; Notable for the following components:      Result Value   CO2 17 (*)    Glucose, Bld 173 (*)    BUN 49 (*)    Creatinine, Ser 3.74 (*)    Calcium 7.6 (*)    Albumin 3.3 (*)    GFR, Estimated 17 (*)    All other components within normal limits  CBC WITH DIFFERENTIAL/PLATELET - Abnormal; Notable for the following components:   RBC 2.96 (*)  Hemoglobin 9.6 (*)    HCT 29.6 (*)    Platelets 142 (*)    All other components within normal limits  URINALYSIS, ROUTINE W REFLEX MICROSCOPIC - Abnormal; Notable for the following components:   APPearance CLOUDY (*)    Hgb urine dipstick SMALL (*)    Protein, ur >300 (*)    Leukocytes,Ua LARGE (*)    All other components within  normal limits  URINALYSIS, MICROSCOPIC (REFLEX) - Abnormal; Notable for the following components:   Bacteria, UA FEW (*)    All other components within normal limits  RESP PANEL BY RT-PCR (FLU A&B, COVID) [id number]  BLOOD GAS, ARTERIAL    EKG None  Radiology No results found.  Procedures BLADDER CATHETERIZATION  Date/Time: 06/02/2021 11:51 AM Performed by: Daleen Bo, MD Authorized by: Daleen Bo, MD   Consent:    Consent obtained:  Verbal   Risks discussed:  Incomplete procedure, infection and urethral injury   Alternatives discussed:  No treatment Universal protocol:    Patient identity confirmed:  Verbally with patient Pre-procedure details:    Procedure purpose:  Therapeutic (Urinary retention greater than 700 cc)   Preparation: Patient was prepped and draped in usual sterile fashion   Anesthesia:    Anesthesia method:  None Procedure details:    Provider performed due to:  Altered anatomy   Altered anatomy:  Paraphimosis   Catheter insertion:  Temporary indwelling   Catheter size:  16 Fr   Bladder irrigation: no     Number of attempts:  1   Urine characteristics:  Cloudy Post-procedure details:    Procedure completion:  Tolerated well, no immediate complications Comments:     Catheterization, by me for urinary retention, with mild paraphimosis.  Paraphimosis is associated with patient's rash. .Critical Care  Date/Time: 06/02/2021 4:37 PM Performed by: Daleen Bo, MD Authorized by: Daleen Bo, MD   Critical care provider statement:    Critical care time (minutes):  35   Critical care start time:  06/02/2021 9:50 AM   Critical care end time:  06/02/2021 4:37 PM   Critical care time was exclusive of:  Separately billable procedures and treating other patients   Critical care was necessary to treat or prevent imminent or life-threatening deterioration of the following conditions:  Metabolic crisis   Critical care was time spent personally by me on the  following activities:  Blood draw for specimens, development of treatment plan with patient or surrogate, discussions with consultants, evaluation of patient's response to treatment, examination of patient, obtaining history from patient or surrogate, ordering and performing treatments and interventions, ordering and review of laboratory studies, pulse oximetry, re-evaluation of patient's condition, review of old charts and ordering and review of radiographic studies   Medications Ordered in ED Medications  0.9 %  sodium chloride infusion ( Intravenous New Bag/Given 06/02/21 1152)  HYDROmorphone (DILAUDID) injection 1 mg (1 mg Intravenous Given 06/02/21 1155)    ED Course  I have reviewed the triage vital signs and the nursing notes.  Pertinent labs & imaging results that were available during my care of the patient were reviewed by me and considered in my medical decision making (see chart for details).  Clinical Course as of 06/02/21 1638  Sun Jun 02, 2021  1150 I assisted the nurse with Foley catheterization.  He has a mild paraphimosis that was reduced, without complication.  The glans penis appears normal. [EW]    Clinical Course User Index [EW] Daleen Bo, MD   MDM  Rules/Calculators/A&P                            Patient Vitals for the past 24 hrs:  BP Temp Temp src Pulse Resp SpO2 Height Weight  06/02/21 1500 (!) 145/78 -- -- (!) 55 -- 98 % -- --  06/02/21 1435 (!) 142/79 -- -- (!) 53 18 96 % -- --  06/02/21 1400 138/78 -- -- (!) 57 18 95 % -- --  06/02/21 1310 (!) 145/77 -- -- (!) 58 16 95 % -- --  06/02/21 1200 (!) 149/82 -- -- (!) 59 -- 95 % -- --  06/02/21 1130 (!) 141/79 -- -- (!) 58 16 96 % -- --  06/02/21 0933 -- -- -- -- -- -- '6\' 6"'$  (1.981 m) (!) 137 kg  06/02/21 0932 (!) 145/82 98.3 F (36.8 C) Oral 62 18 93 % -- --    4:15 PM Reevaluation with update and discussion. After initial assessment and treatment, an updated evaluation reveals he now states that he is  "hurting all over and hungry."  When I entered the room he was snoring, and slowly aroused, and was communicative.  Clinical exam is essentially the same. Daleen Bo   Medical Decision Making:  This patient is presenting for evaluation of weakness, following recent hospital discharge, which does require a range of treatment options, and is a complaint that involves a moderate risk of morbidity and mortality. The differential diagnoses include acute illness, metabolic disorder, medication noncompliance. I decided to review old records, and in summary [place] male who is debilitated, lives alone, has been out of the hospital for 2 days and now cannot get out of his chair, for 24 hours.  He presents by EMS for evaluation.  He has history of chronic kidney disease, chronic pain, hypertension, congestive heart failure, and spinal cord injury.  I did not require additional historical information from anyone.  Clinical Laboratory Tests Ordered, included CBC, Metabolic panel, Urinalysis, and viral panel . Review indicates initial findings were reassuring, except; urinalysis abnormal with.  Increased protein, leukocytes and hemoglobin, cath sample; glucose high, BUN high, creatinine high, hemoglobin low    Critical Interventions-clinical evaluation, laboratory testing.  Foley catheterization for urinary retention, medication treatment, observation and reassessment  After These Interventions, the Patient was reevaluated and was found with elevated BUN and creatinine from baseline, unclear if related to decreased oral intake versus urinary retention.  Mild anemia consistent with baseline.  Patient is debilitated, not thriving in his current home setting.  Prior care at an outlying hospital, recently.  He has a nonspecific rash, unclear cause, but no signs for anaphylaxis.  Hospitalist will be consulted for hospitalization.  Arterial blood gas ordered to check respiratory status.  ABG does not show respiratory  failure.  CRITICAL CARE-yes Performed by: Daleen Bo  Nursing Notes Reviewed/ Care Coordinated Applicable Imaging Reviewed Interpretation of Laboratory Data incorporated into ED treatment   5:08 PM-I discussed with hospitalist who plans on admitting the patient.  Final Clinical Impression(s) / ED Diagnoses Final diagnoses:  Urinary retention  AKI (acute kidney injury) ([place])  Noncompliance with medication regimen  Other chronic pain  Rash    Rx / DC Orders ED Discharge Orders     None        Daleen Bo, MD 06/02/21 1708

## 2021-06-02 NOTE — ED Triage Notes (Signed)
Pt presents to ED via ems cc urine retention. Pt states getting released from [facility] for rehab. Pt states groin swelling for the past 3 weeks and urine retention since last night. Pt states Pain from head to left foot. Pt A&Ox4. Respirations equal, unlabored.
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
## 2021-06-02 NOTE — ED Notes (Signed)
RT consulted for ABG

## 2021-06-03 ENCOUNTER — Encounter (HOSPITAL_COMMUNITY): Payer: Self-pay | Admitting: Internal Medicine

## 2021-06-03 DIAGNOSIS — G894 Chronic pain syndrome: Secondary | ICD-10-CM

## 2021-06-03 DIAGNOSIS — R5381 Other malaise: Secondary | ICD-10-CM

## 2021-06-03 DIAGNOSIS — Z9114 Patient's other noncompliance with medication regimen: Secondary | ICD-10-CM

## 2021-06-03 DIAGNOSIS — L8990 Pressure ulcer of unspecified site, unspecified stage: Secondary | ICD-10-CM | POA: Insufficient documentation

## 2021-06-03 LAB — CBC
HCT: 28.6 % — ABNORMAL LOW (ref 39.0–52.0)
Hemoglobin: 9.2 g/dL — ABNORMAL LOW (ref 13.0–17.0)
MCH: 32.1 pg (ref 26.0–34.0)
MCHC: 32.2 g/dL (ref 30.0–36.0)
MCV: 99.7 fL (ref 80.0–100.0)
Platelets: 143 10*3/uL — ABNORMAL LOW (ref 150–400)
RBC: 2.87 MIL/uL — ABNORMAL LOW (ref 4.22–5.81)
RDW: 13.8 % (ref 11.5–15.5)
WBC: 5 10*3/uL (ref 4.0–10.5)
nRBC: 0 % (ref 0.0–0.2)

## 2021-06-03 LAB — COMPREHENSIVE METABOLIC PANEL
ALT: 18 U/L (ref 0–44)
AST: 21 U/L (ref 15–41)
Albumin: 3.2 g/dL — ABNORMAL LOW (ref 3.5–5.0)
Alkaline Phosphatase: 95 U/L (ref 38–126)
Anion gap: 8 (ref 5–15)
BUN: 49 mg/dL — ABNORMAL HIGH (ref 8–23)
CO2: 18 mmol/L — ABNORMAL LOW (ref 22–32)
Calcium: 7.4 mg/dL — ABNORMAL LOW (ref 8.9–10.3)
Chloride: 112 mmol/L — ABNORMAL HIGH (ref 98–111)
Creatinine, Ser: 2.98 mg/dL — ABNORMAL HIGH (ref 0.61–1.24)
GFR, Estimated: 22 mL/min — ABNORMAL LOW (ref >60)
Glucose, Bld: 171 mg/dL — ABNORMAL HIGH (ref 70–99)
Potassium: 4.5 mmol/L (ref 3.5–5.1)
Sodium: 138 mmol/L (ref 135–145)
Total Bilirubin: 0.7 mg/dL (ref 0.3–1.2)
Total Protein: 6.6 g/dL (ref 6.5–8.1)

## 2021-06-03 LAB — HEMOGLOBIN A1C
Hgb A1c MFr Bld: 5.9 % — ABNORMAL HIGH (ref 4.8–5.6)
Mean Plasma Glucose: 122.63 mg/dL

## 2021-06-03 LAB — GLUCOSE, CAPILLARY
Glucose-Capillary: 156 mg/dL — ABNORMAL HIGH (ref 70–99)
Glucose-Capillary: 113 mg/dL — ABNORMAL HIGH (ref 70–99)
Glucose-Capillary: 105 mg/dL — ABNORMAL HIGH (ref 70–99)
Glucose-Capillary: 116 mg/dL — ABNORMAL HIGH (ref 70–99)
Glucose-Capillary: 115 mg/dL — ABNORMAL HIGH (ref 70–99)

## 2021-06-03 LAB — HIV ANTIBODY (ROUTINE TESTING W REFLEX): HIV Screen 4th Generation wRfx: NONREACTIVE

## 2021-06-03 MED ORDER — FINASTERIDE 5 MG PO TABS
5.0000 mg | ORAL_TABLET | Freq: Every day | ORAL | Status: DC
  Administered 2021-06-03 – 2021-06-06 (×4): 5 mg via ORAL
  Filled 2021-06-03 (×4): qty 1

## 2021-06-03 MED ORDER — OXYCODONE HCL ER 30 MG PO T12A: 30.0000 mg | EXTENDED_RELEASE_TABLET | Freq: Two times a day (BID) | ORAL | Status: DC

## 2021-06-03 MED ORDER — FLUCONAZOLE 100MG IVPB
100.0000 mg | Freq: Once | INTRAVENOUS | Status: AC
  Administered 2021-06-03: 100 mg via INTRAVENOUS
  Filled 2021-06-03: qty 50

## 2021-06-03 MED ORDER — LACTULOSE 10 GM/15ML PO SOLN
10.0000 g | Freq: Two times a day (BID) | ORAL | Status: DC
  Administered 2021-06-03 – 2021-06-06 (×4): 10 g via ORAL
  Filled 2021-06-03 (×7): qty 15

## 2021-06-03 MED ORDER — ATORVASTATIN CALCIUM 40 MG PO TABS
80.0000 mg | ORAL_TABLET | Freq: Every day | ORAL | Status: DC
  Administered 2021-06-03 – 2021-06-06 (×4): 80 mg via ORAL
  Filled 2021-06-03 (×4): qty 2

## 2021-06-03 MED ORDER — TRAMADOL HCL 50 MG PO TABS
50.0000 mg | ORAL_TABLET | Freq: Once | ORAL | Status: AC
  Administered 2021-06-03: 50 mg via ORAL
  Filled 2021-06-03: qty 1

## 2021-06-03 MED ORDER — OXYCODONE HCL ER 15 MG PO T12A
30.0000 mg | EXTENDED_RELEASE_TABLET | Freq: Two times a day (BID) | ORAL | Status: DC
  Administered 2021-06-03 – 2021-06-06 (×7): 30 mg via ORAL
  Filled 2021-06-03 (×7): qty 2

## 2021-06-03 MED ORDER — NALOXEGOL OXALATE 25 MG PO TABS
25.0000 mg | ORAL_TABLET | Freq: Every day | ORAL | Status: DC
  Administered 2021-06-04 – 2021-06-06 (×3): 25 mg via ORAL
  Filled 2021-06-03 (×4): qty 1

## 2021-06-03 MED ORDER — METOPROLOL TARTRATE 50 MG PO TABS
100.0000 mg | ORAL_TABLET | Freq: Two times a day (BID) | ORAL | Status: DC
  Administered 2021-06-03 – 2021-06-06 (×6): 100 mg via ORAL
  Filled 2021-06-03 (×7): qty 2

## 2021-06-03 MED ORDER — CLOTRIMAZOLE 1 % EX CREA
TOPICAL_CREAM | Freq: Two times a day (BID) | CUTANEOUS | Status: DC
  Administered 2021-06-05: 1 via TOPICAL
  Filled 2021-06-03 (×4): qty 15
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
## 2021-06-03 NOTE — NC FL2 (Signed)
[place] LEVEL OF CARE SCREENING TOOL     IDENTIFICATION  Patient Name: Thomas [name] Birthdate: 1951/07/13 Sex: male Admission Date (Current Location): 06/02/2021  [place] and [place] Number:  Kathleen Argue [id number] [place] and Address:  [facility],  [place], [place]      Provider Number: [id number]  Attending Physician Name and Address:  Geradine Girt, DO  Relative Name and Phone Number:  IRINEO DERRING (brother) Ph: [phone]    Current Level of Care: Hospital Recommended Level of Care: [place] Prior Approval Number:    Date Approved/Denied:   PASRR Number: [id number]  Discharge Plan: SNF    Current Diagnoses: Patient Active Problem List   Diagnosis Date Noted   Pressure injury of skin 06/03/2021   AKI (acute kidney injury) ([place]) 06/02/2021   Pain management    Cord compression ([place])    Drug-induced constipation    Acute midline low back pain with sciatica    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 ([place]) 08/30/2020   Urinary retention 12/04/2019   BPH (benign prostatic hyperplasia) 11/24/2019   CKD (chronic kidney disease), stage IV ([place]) 11/24/2019   Chronic pain syndrome 11/24/2019   Physical deconditioning 11/24/2019   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[place]) 08/31/2019   Pulmonary emboli ([place]) 09/29/2018   [name], right ([place]) 09/29/2018   [name], type 2) ([place]) 09/29/2018   Gout 09/29/2018   HTN (hypertension) 09/29/2018   CHF (congestive heart failure), NYHA class I, chronic, diastolic ([place]) [id number]   GERD (gastroesophageal reflux disease) 09/29/2018    Orientation RESPIRATION BLADDER Height & Weight     Self, Time, Situation, Place  O2 (1L/min) Continent Weight: (!) 302 lb (137 kg) Height:  '6\' 6"'$  (198.1 cm)  BEHAVIORAL SYMPTOMS/MOOD NEUROLOGICAL BOWEL NUTRITION STATUS      Continent Diet (Carb modified/heart healthy)  AMBULATORY STATUS COMMUNICATION OF  NEEDS Skin   Extensive Assist Verbally Other (Comment) (Rash: hands, thighs, trunk; Redness/swelling: genitals, groin (being treated as fungal rash))                       Personal Care Assistance Level of Assistance  Bathing, Feeding, Dressing Bathing Assistance: Limited assistance Feeding assistance: Independent Dressing Assistance: Limited assistance     Functional Limitations Info  Sight, Hearing, Speech Sight Info: Adequate Hearing Info: Adequate Speech Info: Adequate    SPECIAL CARE FACTORS FREQUENCY  PT (By licensed PT), OT (By licensed OT)     PT Frequency: 5x's/week OT Frequency: 5x's/week            Contractures Contractures Info: Not present    Additional Factors Info  Code Status, Allergies, Psychotropic, Insulin Sliding Scale Code Status Info: Full Allergies Info: Oxymorphone, Aspirin, Beta Vulgaris, Buspirone, Cabbage, Codeine, Dolobid (Diflunisal), Fish Allergy, Fish-derived Products, Methadone, Pentazocine, Propoxyphene, Shellfish Allergy, Sulfa Antibiotics, Sulfasalazine, Vistaril (Hydroxyzine), Amoxicillin Psychotropic Info: Neurontin Insulin Sliding Scale Info: See discharge summary       Current Medications (06/03/2021):  This is the current hospital active medication list Current Facility-Administered Medications  Medication Dose Route Frequency Provider Last Rate Last Admin   acetaminophen (TYLENOL) tablet 650 mg  650 mg Oral Q6H PRN Eulogio Bear U, DO   650 mg at 06/03/21 [id number]   Or   acetaminophen (TYLENOL) suppository 650 mg  650 mg Rectal Q6H PRN Eulogio Bear U, DO       atorvastatin (LIPITOR) tablet 80 mg  80 mg  Oral Daily Geradine Girt, DO   80 mg at 06/03/21 1019   clotrimazole (LOTRIMIN) 1 % cream   Topical BID Geradine Girt, DO   Given at 06/03/21 1020   finasteride (PROSCAR) tablet 5 mg  5 mg Oral Daily Eulogio Bear U, DO   5 mg at 06/03/21 1019   fluconazole (DIFLUCAN) IVPB 100 mg  100 mg Intravenous Once Vann, Jessica U, DO        gabapentin (NEURONTIN) capsule 300 mg  300 mg Oral QHS Vann, Jessica U, DO       heparin injection 5,000 Units  5,000 Units Subcutaneous Q8H Eulogio Bear U, DO   5,000 Units at 06/03/21 [id number]   insulin aspart (novoLOG) injection 0-15 Units  0-15 Units Subcutaneous TID WC Eulogio Bear U, DO   3 Units at 06/03/21 [id number]   insulin aspart (novoLOG) injection 0-5 Units  0-5 Units Subcutaneous QHS Vann, Jessica U, DO       lactulose (CHRONULAC) 10 GM/15ML solution 10 g  10 g Oral BID Vann, Jessica U, DO   10 g at 06/03/21 1019   levothyroxine (SYNTHROID) tablet 50 mcg  50 mcg Oral Q0600 Geradine Girt, DO   50 mcg at 06/03/21 [id number]   methocarbamol (ROBAXIN) tablet 750 mg  750 mg Oral Q8H PRN Geradine Girt, DO   750 mg at 06/03/21 0143   metoprolol tartrate (LOPRESSOR) tablet 100 mg  100 mg Oral BID Vann, Jessica U, DO   100 mg at 06/03/21 1019   naloxegol oxalate (MOVANTIK) tablet 25 mg  25 mg Oral Daily Vann, Jessica U, DO       ondansetron (ZOFRAN) tablet 4 mg  4 mg Oral Q6H PRN Eulogio Bear U, DO       Or   ondansetron (ZOFRAN) injection 4 mg  4 mg Intravenous Q6H PRN Vann, Jessica U, DO       oxyCODONE (OXYCONTIN) 12 hr tablet 30 mg  30 mg Oral Q12H Vann, Jessica U, DO   30 mg at 06/03/21 1019   senna-docusate (Senokot-S) tablet 1 tablet  1 tablet Oral QHS PRN Eulogio Bear U, DO       tamsulosin (FLOMAX) capsule 0.4 mg  0.4 mg Oral Daily Vann, Jessica U, DO   0.4 mg at 06/03/21 1019   vitamin B-12 (CYANOCOBALAMIN) tablet 250 mcg  250 mcg Oral Daily Eulogio Bear U, DO   250 mcg at 06/03/21 1019     Discharge Medications: Please see discharge summary for a list of discharge medications.  Relevant Imaging Results:  Relevant Lab Results:   Additional Information SSN: [id number]. Patient has right BKA and a prosthesis.  Sherie Don, LCSW
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
## 2021-06-03 NOTE — TOC Initial Note (Signed)
Transition of Care [facility]) - Initial/Assessment Note   Patient Details  Name: Thomas [name] MRN: [id number] Date of Birth: 11/18/1950  [facility] CM/SW Contact:    Sherie Don, LCSW Phone Number: 06/03/2021, 12:26 PM  Clinical Narrative: PT evaluation recommended SNF. CSW spoke with patient who is agreeable to SNF. Patient was recently in [facility] for SNF, but the number of days used at this time is unknown.  FL2 done; PASRR confirmed. Initial referral faxed out.  CSW updated that patient is awaiting a prosthetic leg through [employer]. CSW spoke with [place] at [place]. Per [place], patient was supposed to be fitted last week but he had not been wearing his shrinker, so patient could not be fitted due to swelling. Patient is also still awaiting approval from insurance for the prothesis.  TOC awaiting bed offers.  Expected Discharge Plan: Skilled Nursing Facility Barriers to Discharge: Continued Medical Work up, SNF Pending bed offer  Patient Goals and CMS Choice Patient states their goals for this hospitalization and ongoing recovery are:: Go to short-term rehab CMS Medicare.gov Compare Post Acute Care list provided to:: Patient Choice offered to / list presented to : Patient  Expected Discharge Plan and Services Expected Discharge Plan: [place] In-house Referral: Clinical Social Work Post Acute Care Choice: [place] Living arrangements for the past 2 months: Apartment           DME Arranged: N/A DME Agency: NA  Prior Living Arrangements/Services Living arrangements for the past 2 months: Apartment Lives with:: Self Patient language and need for interpreter reviewed:: Yes Do you feel safe going back to the place where you live?: Yes      Need for Family Participation in Patient Care: No (Comment) Care giver support system in place?: Yes (comment) Criminal Activity/Legal Involvement Pertinent to Current  Situation/Hospitalization: No - Comment as needed  Activities of Daily Living Home Assistive Devices/Equipment: None ADL Screening (condition at time of admission) Patient's cognitive ability adequate to safely complete daily activities?: No Is the patient deaf or have difficulty hearing?: No Does the patient have difficulty seeing, even when wearing glasses/contacts?: No Does the patient have difficulty concentrating, remembering, or making decisions?: No Patient able to express need for assistance with ADLs?: Yes Does the patient have difficulty dressing or bathing?: Yes Independently performs ADLs?: No Communication: Independent Dressing (OT): Needs assistance Is this a change from baseline?: Pre-admission baseline Grooming: Needs assistance Is this a change from baseline?: Pre-admission baseline Feeding: Independent Bathing: Needs assistance Is this a change from baseline?: Pre-admission baseline Toileting: Needs assistance Is this a change from baseline?: Pre-admission baseline In/Out Bed: Dependent Is this a change from baseline?: Pre-admission baseline Walks in Home: Dependent Is this a change from baseline?: Change from baseline, expected to last <3 days Does the patient have difficulty walking or climbing stairs?: Yes Weakness of Legs: Both Weakness of Arms/Hands: None  Permission Sought/Granted Permission sought to share information with : Facility Art therapist granted to share information with : Yes, Verbal Permission Granted Permission granted to share info w AGENCY: SNFs  Emotional Assessment Attitude/Demeanor/Rapport: Engaged Affect (typically observed): Accepting Orientation: : Oriented to Self, Oriented to Place, Oriented to  Time, Oriented to Situation Alcohol / Substance Use: Not Applicable Psych Involvement: No (comment)  Admission diagnosis:  Urinary retention [R33.9] Rash [R21] Other chronic pain [G89.29] Noncompliance with  medication regimen [Z91.14] AKI (acute kidney injury) ([place]) [N17.9] Patient Active Problem List   Diagnosis Date  Noted   Pressure injury of skin 06/03/2021   AKI (acute kidney injury) ([place]) 06/02/2021   Pain management    Cord compression (HCC)    Drug-induced constipation    Acute midline low back pain with sciatica    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 ([place]) 08/30/2020   Urinary retention 12/04/2019   BPH (benign prostatic hyperplasia) 11/24/2019   CKD (chronic kidney disease), stage IV ([place]) 11/24/2019   Chronic pain syndrome 11/24/2019   Physical deconditioning 11/24/2019   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[place]) 08/31/2019   Pulmonary emboli ([place]) 09/29/2018   [name], right ([place]) 09/29/2018   [name], type 2) ([place]) 09/29/2018   Gout 09/29/2018   HTN (hypertension) 09/29/2018   CHF (congestive heart failure), NYHA class I, chronic, diastolic ([place]) [id number]   GERD (gastroesophageal reflux disease) 09/29/2018   PCP:  Antonietta Jewel, MD Pharmacy:   [place], [place] [address] [place] [address] Phone: [phone] Fax: [phone]  Readmission Risk Interventions Readmission Risk Prevention Plan 09/17/2019  Transportation Screening Complete  PCP or Specialist Appt within 5-7 Days Not Complete  Not Complete comments snf vs home  Home Care Screening Complete  Medication Review (RN CM) Complete  Some recent data might be hidden

## 2021-06-03 NOTE — Evaluation (Signed)
Physical Therapy Evaluation Patient Details Name: Thomas [name] MRN: [id number] DOB: 04/07/1951 Today's Date: 06/03/2021   History of Present Illness  70 yo male admitted with AKI, scrotal swelling, weakness. 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  Clinical Impression  On eval, pt required Supv for rolling to L and R sides in bed. He declined attempt at supine to sit 2* pain, specifically in groin/scrotum areas. Will plan to follow and progress activity as pt is able. At this time, recommendation is for SNF.     Follow Up Recommendations SNF    Equipment Recommendations  Wheelchair ;Wheelchair cushion (this may not work 2* pt states WC will not fit inside home. However, he has a prosthesis that he reports doesn't fit him anymore???)    Recommendations for Other Services       Precautions / Restrictions Precautions Precautions: Fall Precaution Comments: R BKA (pt reports prosthesis doesn't fit?) Restrictions Weight Bearing Restrictions: No      Mobility  Bed Mobility Overal bed mobility: Needs Assistance Bed Mobility: Rolling Rolling: Supervision         General bed mobility comments: Roll to L and R sides with use of bedrail. Increased time. Pt declined sitting EOB attempt 2* pain    Transfers                    Ambulation/Gait                Stairs            Wheelchair Mobility    Modified Rankin (Stroke Patients Only)       Balance Overall balance assessment: History of Falls                                           Pertinent Vitals/Pain Pain Assessment: Faces Faces Pain Scale: Hurts worst Pain Location: groin Pain Descriptors / Indicators: Grimacing;Discomfort Pain Intervention(s): Limited activity within patient's tolerance;Monitored during session    Home Living  Family/patient expects to be discharged to:: Unsure Living Arrangements: Alone   Type of Home: Apartment Home Access: Stairs to enter Entrance Stairs-Rails: None Entrance Stairs-Number of Steps: 1   Home Equipment: Cane - single point;Crutches;Walker - 2 wheels      Prior Function Level of Independence: Needs assistance   Gait / Transfers Assistance Needed: lateral scoot during prior admissions. has prosthesis but he states it doesn't fit. Pt states wheelchair will not fit in his home??     Comments: Pt reports previously using cane and crutch (at some point), has prosthetic LE-unsure if it fits. Today, pt reports he has not been able to stand since october 2021.     Hand Dominance        Extremity/Trunk Assessment   Upper Extremity Assessment Upper Extremity Assessment: Defer to OT evaluation (has rash on L hand, R forearm. c/o numbness in fingers L hand)    Lower Extremity Assessment Lower Extremity Assessment: RLE deficits/detail;LLE deficits/detail RLE Deficits / Details: hip flexion at least 3/5 LLE Deficits / Details: strength ~3/5 thoughout       Communication   Communication: No difficulties  Cognition Arousal/Alertness: Awake/alert Behavior During Therapy: WFL for tasks assessed/performed Overall Cognitive Status: Within Functional Limits for tasks assessed  General Comments: a bit talkative      General Comments      Exercises     Assessment/Plan    PT Assessment Patient needs continued PT services  PT Problem List         PT Treatment Interventions DME instruction;Functional mobility training;Therapeutic activities;Patient/family education;Therapeutic exercise;Balance training;Wheelchair mobility training    PT Goals (Current goals can be found in the Care Plan section)  Acute Rehab PT Goals Patient Stated Goal: less pain PT Goal Formulation: With patient Time For Goal Achievement:  06/17/21 Potential to Achieve Goals: Fair    Frequency Min 2X/week   Barriers to discharge Decreased caregiver support      Co-evaluation               AM-PAC PT "6 Clicks" Mobility  Outcome Measure Help needed turning from your back to your side while in a flat bed without using bedrails?: None Help needed moving from lying on your back to sitting on the side of a flat bed without using bedrails?: Total Help needed moving to and from a bed to a chair (including a wheelchair)?: Total Help needed standing up from a chair using your arms (e.g., wheelchair or bedside chair)?: Total Help needed to walk in hospital room?: Total Help needed climbing 3-5 steps with a railing? : Total 6 Click Score: 9    End of Session   Activity Tolerance: Patient limited by pain Patient left: in bed;with call bell/phone within reach;with bed alarm set   PT Visit Diagnosis: History of falling (Z91.81);Pain;Muscle weakness (generalized) (M62.81);Other abnormalities of gait and mobility (R26.89) Pain - part of body:  (groin/scrotum)    Time: [id number] PT Time Calculation (min) (ACUTE ONLY): 12 min   Charges:   PT Evaluation $PT Eval Moderate Complexity: 1 Mod             Doreatha Massed, PT Acute Rehabilitation  Office: [phone] Pager: [phone]

## 2021-06-03 NOTE — Consult Note (Signed)
WOC Nurse Consult Note: Reason for Consult: pressure injuries; rash Hard to determine sequence of events from this patient; new onset rash between his fingers of the left hand, wrist of the right UE.  Redness noted left flank and left hip does not appear to be pressure related   Wound type: New onset rash Partial thickness skin loss penis Large edematous penis with redness noted bilateral groin; this appears to be yeast like Pressure Injury POA: NA Measurement:not measured   Dressing procedure/placement/frequency: Treat groin and penis with antifungal Consult ID or dermatology for new onset rash No other topical care recommended ? Urology related to penile swelling   Discussed POC with patient and bedside nurse.  Re consult if needed, will not follow at this time. Thanks  Thomas [name] [employer], RN,CWOCN, CNS, [place] [phone])

## 2021-06-03 NOTE — Plan of Care (Addendum)
Patient educated on POC, evaluations, and medications.   MD notified of pt request for pain medicines and home meds to control his BP. Tramadol ordered and patient notified.

## 2021-06-04 LAB — GLUCOSE, CAPILLARY
Glucose-Capillary: 94 mg/dL (ref 70–99)
Glucose-Capillary: 116 mg/dL — ABNORMAL HIGH (ref 70–99)
Glucose-Capillary: 153 mg/dL — ABNORMAL HIGH (ref 70–99)
Glucose-Capillary: 182 mg/dL — ABNORMAL HIGH (ref 70–99)

## 2021-06-04 LAB — URINE CULTURE: Culture: NO GROWTH

## 2021-06-04 LAB — CBC
HCT: 28.2 % — ABNORMAL LOW (ref 39.0–52.0)
Hemoglobin: 9.3 g/dL — ABNORMAL LOW (ref 13.0–17.0)
MCH: 32.9 pg (ref 26.0–34.0)
MCHC: 33 g/dL (ref 30.0–36.0)
MCV: 99.6 fL (ref 80.0–100.0)
Platelets: 160 10*3/uL (ref 150–400)
RBC: 2.83 MIL/uL — ABNORMAL LOW (ref 4.22–5.81)
RDW: 14.1 % (ref 11.5–15.5)
WBC: 4.8 10*3/uL (ref 4.0–10.5)
nRBC: 0 % (ref 0.0–0.2)

## 2021-06-04 LAB — BASIC METABOLIC PANEL
Anion gap: 8 (ref 5–15)
BUN: 50 mg/dL — ABNORMAL HIGH (ref 8–23)
CO2: 20 mmol/L — ABNORMAL LOW (ref 22–32)
Calcium: 7.3 mg/dL — ABNORMAL LOW (ref 8.9–10.3)
Chloride: 112 mmol/L — ABNORMAL HIGH (ref 98–111)
Creatinine, Ser: 2.82 mg/dL — ABNORMAL HIGH (ref 0.61–1.24)
GFR, Estimated: 24 mL/min — ABNORMAL LOW (ref >60)
Glucose, Bld: 126 mg/dL — ABNORMAL HIGH (ref 70–99)
Potassium: 4.3 mmol/L (ref 3.5–5.1)
Sodium: 140 mmol/L (ref 135–145)

## 2021-06-04 MED ORDER — HYDRALAZINE HCL 50 MG PO TABS
100.0000 mg | ORAL_TABLET | Freq: Three times a day (TID) | ORAL | Status: DC
  Administered 2021-06-04 – 2021-06-06 (×7): 100 mg via ORAL
  Filled 2021-06-04 (×7): qty 2

## 2021-06-04 MED ORDER — AMLODIPINE BESYLATE 10 MG PO TABS
10.0000 mg | ORAL_TABLET | Freq: Every day | ORAL | Status: DC
  Administered 2021-06-04 – 2021-06-06 (×3): 10 mg via ORAL
  Filled 2021-06-04 (×3): qty 1

## 2021-06-04 MED ORDER — CLONIDINE HCL 0.1 MG PO TABS
0.3000 mg | ORAL_TABLET | Freq: Three times a day (TID) | ORAL | Status: DC
  Administered 2021-06-04 – 2021-06-06 (×7): 0.3 mg via ORAL
  Filled 2021-06-04 (×7): qty 3

## 2021-06-04 MED ORDER — FLUCONAZOLE 150 MG PO TABS
150.0000 mg | ORAL_TABLET | Freq: Once | ORAL | Status: AC
  Administered 2021-06-04: 150 mg via ORAL
  Filled 2021-06-04: qty 1
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
## 2021-06-04 NOTE — TOC Progression Note (Signed)
Transition of Care [facility]) - Progression Note   Patient Details  Name: Thomas [name] MRN: [id number] Date of Birth: [birth year]  Transition of Care [facility]) CM/SW [place], LCSW Phone Number: 06/04/2021, 2:13 PM  Clinical Narrative: CSW provided patient with his SNF bed offers. Patient to review bed choices and give decision to CSW tomorrow. TOC to follow.  Expected Discharge Plan: [place] Barriers to Discharge: Continued Medical Work up, SNF Pending bed offer  Expected Discharge Plan and Services Expected Discharge Plan: [place] In-house Referral: Clinical Social Work Post Acute Care Choice: [place] Living arrangements for the past 2 months: Apartment             DME Arranged: N/A DME Agency: NA  Readmission Risk Interventions Readmission Risk Prevention Plan 09/17/2019  Transportation Screening Complete  PCP or Specialist Appt within 5-7 Days Not Complete  Not Complete comments snf vs home  Home Care Screening Complete  Medication Review (RN CM) Complete  Some recent data might be hidden

## 2021-06-05 ENCOUNTER — Encounter (HOSPITAL_COMMUNITY): Payer: Self-pay | Admitting: Internal Medicine

## 2021-06-05 LAB — BASIC METABOLIC PANEL
Anion gap: 6 (ref 5–15)
BUN: 38 mg/dL — ABNORMAL HIGH (ref 8–23)
CO2: 21 mmol/L — ABNORMAL LOW (ref 22–32)
Calcium: 7.7 mg/dL — ABNORMAL LOW (ref 8.9–10.3)
Chloride: 113 mmol/L — ABNORMAL HIGH (ref 98–111)
Creatinine, Ser: 2.41 mg/dL — ABNORMAL HIGH (ref 0.61–1.24)
GFR, Estimated: 29 mL/min — ABNORMAL LOW (ref >60)
Glucose, Bld: 111 mg/dL — ABNORMAL HIGH (ref 70–99)
Potassium: 4.5 mmol/L (ref 3.5–5.1)
Sodium: 140 mmol/L (ref 135–145)

## 2021-06-05 LAB — GLUCOSE, CAPILLARY
Glucose-Capillary: 112 mg/dL — ABNORMAL HIGH (ref 70–99)
Glucose-Capillary: 134 mg/dL — ABNORMAL HIGH (ref 70–99)
Glucose-Capillary: 156 mg/dL — ABNORMAL HIGH (ref 70–99)
Glucose-Capillary: 137 mg/dL — ABNORMAL HIGH (ref 70–99)
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
## 2021-06-05 NOTE — TOC Progression Note (Signed)
Transition of Care [facility]) - Progression Note   Patient Details  Name: Thomas [name] MRN: [id number] Date of Birth: 11-Sep-1951  [facility]) CM/SW Catherine, LCSW Phone Number: 06/05/2021, 3:34 PM  Clinical Narrative: CSW met with patient multiple times today to go over bed offers and during this time several beds were no longer available as there are limited unvaccinated bed at the SNFs. [facility] are the remaining bed offers that will accept an unvaccinated patient, which was explained to the patient several times. Patient initially stated he wanted to discharge home and have his brother pick him up, but acknowledged he cannot care for himself at this time as he is deconditioned. Patient has accepted a bed at [facility] can accept the patient tomorrow morning pending discharge paperwork and negative COVID test. TOC to follow.  Expected Discharge Plan: [place] Barriers to Discharge: Continued Medical Work up, SNF Pending bed offer  Expected Discharge Plan and Services Expected Discharge Plan: [place] In-house Referral: Clinical Social Work Post Acute Care Choice: [place] Living arrangements for the past 2 months: Apartment            DME Arranged: N/A DME Agency: NA  Readmission Risk Interventions Readmission Risk Prevention Plan 09/17/2019  Transportation Screening Complete  PCP or Specialist Appt within 5-7 Days Not Complete  Not Complete comments snf vs home  Home Care Screening Complete  Medication Review (RN CM) Complete  Some recent data might be hidden

## 2021-06-06 LAB — RESP PANEL BY RT-PCR (FLU A&B, COVID) ARPGX2
Influenza A by PCR: NEGATIVE
Influenza B by PCR: NEGATIVE
SARS Coronavirus 2 by RT PCR: NEGATIVE

## 2021-06-06 LAB — GLUCOSE, CAPILLARY
Glucose-Capillary: 119 mg/dL — ABNORMAL HIGH (ref 70–99)
Glucose-Capillary: 146 mg/dL — ABNORMAL HIGH (ref 70–99)
Glucose-Capillary: 160 mg/dL — ABNORMAL HIGH (ref 70–99)

## 2021-06-06 MED ORDER — GABAPENTIN 300 MG PO CAPS: 300.0000 mg | ORAL_CAPSULE | Freq: Every day | ORAL | 0 refills | Status: DC

## 2021-06-06 MED ORDER — OXYCONTIN 30 MG PO T12A: 30.0000 mg | EXTENDED_RELEASE_TABLET | Freq: Two times a day (BID) | ORAL | 0 refills | Status: DC

## 2021-06-06 MED ORDER — CLOTRIMAZOLE 1 % EX CREA: TOPICAL_CREAM | Freq: Two times a day (BID) | CUTANEOUS | 0 refills | Status: DC

## 2021-06-06 MED ORDER — METHOCARBAMOL 750 MG PO TABS
750.0000 mg | ORAL_TABLET | Freq: Three times a day (TID) | ORAL | 0 refills | Status: DC | PRN
Start: 2021-06-06 — End: 2021-12-19

## 2021-06-06 NOTE — TOC Transition Note (Signed)
Transition of Care [facility]) - CM/SW Discharge Note  Patient Details  Name: Thomas [name] MRN: [id number] Date of Birth: 1951/05/18  Transition [facility]) CM/SW Contact:  Sherie Don, LCSW Phone Number: 06/06/2021, 12:43 PM  Clinical Narrative: CSW notified by Carollee Leitz that [facility] in [place] will have a bed for an unvaccinated male today. Patient wants to discharge to a SNF closer to home in [place]. CSW updated Juliann Pulse with [facility] that patient is choosing a different SNF bed.  Discharge summary, discharge orders, and SNF transfer report faxed to the facility in the hub. Patient will go to room 208A and the number for report [phone]. Medical necessity form done; PTAR scheduled. Discharge packet completed. RN updated. Patient notified of discharge. TOC signing off.  Final next level of care: Skilled Nursing Facility Barriers to Discharge: Barriers Resolved  Patient Goals and CMS Choice Patient states their goals for this hospitalization and ongoing recovery are:: Go to short-term rehab CMS Medicare.gov Compare Post Acute Care list provided to:: Patient Choice offered to / list presented to : Patient  Discharge Placement Existing PASRR number confirmed : 06/03/21          Patient [facility] bed at: [facility] Patient to be transferred to facility by: PTAR Patient and family notified of of transfer: 06/06/21  Discharge Plan and Services In-house Referral: Clinical Social Work Post Acute Care Choice: [place]          DME Arranged: N/A DME Agency: NA  Readmission Risk Interventions Readmission Risk Prevention Plan 09/17/2019  Transportation Screening Complete  PCP or Specialist Appt within 5-7 Days Not Complete  Not Complete comments snf vs home  Home Care Screening Complete  Medication Review (RN CM) Complete  Some recent data might be hidden

## 2021-06-06 NOTE — Discharge Summary (Signed)
Physician Discharge Summary  Thomas [name] [id number] DOB: 09-May-1951 DOA: 06/02/2021  PCP: Antonietta Jewel, MD  Admit date: 06/02/2021 Discharge date: 06/06/2021  Time spent: 42 minutes  Recommendations for Outpatient Follow-up:  CBC Chem-12 in 1 week Need TSH in 4 to 6 weeks Outpatient follow-up orthopedics as per them and shrinker etc. and weightbearing precautions per them Limited prescription of controlled substance ordered for the patient  Discharge Diagnoses:  MAIN problem for hospitalization   Hyperkalemia AKI on admission  Please see below for itemized issues addressed in HOpsital- refer to other progress notes for clarity if needed  Discharge Condition: Improved  Diet recommendation: Heart healthy  Filed Weights   06/02/21 0933  Weight: (!) 137 kg    History of present illness:  27 white male CVA HTN HLD gout DM TY 2 PAD status post recent BKA went [facility]-sent home without meds and return to [place] ER-found to have AKI and transferred to [facility] Course:  AKI superimposed on CKD Baseline unclear last labs CKD 4 creatinine around 2-trended downwards and improved  Acute urinary retention Resolved-resume Proscar and Flomax on discharge  Skin rash possible yeast Wound care consulted-treated with Diflucan during hospital stay and stabilized  Recent BKA Possible chronic pain Patient was given prescription of limited prescription on discharge and his gabapentin was adjusted downwards He will need close outpatient follow-up for his BKA and would be careful with pain meds as there was some concern at [facility] hospitalization of diversion of meds and pain seeking behaviors per their note [he was prescribed it twice daily on discharge but insisted on it being given 3 times daily]  Deconditioning Will need leg shrinker in the outpatient setting  Hypothyroid Meds r resumed  Discharge Exam: Vitals:   06/05/21 2117 06/06/21 0456  BP:  (!) 167/89 (!) 163/87  Pulse: (!) 56 (!) 54  Resp: 20 16  Temp: 98.2 F (36.8 C) 98 F (36.7 C)  SpO2: 100% 97%    Subj on day of d/c   Awake pleasant no distress eating drinking coherent Having some diarrhea and we talked about cutting back the meds  General Exam on discharge  EOMI NCAT no focal deficit thick neck Mallampati 4 S1-S2 no murmur Chest clear no rales rhonchi Right stump not examined  Discharge Instructions   Discharge Instructions     Diet - low sodium heart healthy   Complete by: As directed    Increase activity slowly   Complete by: As directed    No wound care   Complete by: As directed       Allergies as of 06/06/2021       Reactions   Other Other (See Comments)   Blood pressure issues Per [facility] hospital: Pt states he can only take these pain meds or else he gets very sick: Oxycontin,morphine,demerol, and dilaudid are the only pain meds pt states he can take.    Oxymorphone Other (See Comments)   Causes kidney problems   Aspirin Nausea And Vomiting   Per [facility]   Beta Vulgaris Nausea And Vomiting   Beets   Buspirone Other (See Comments)   unknown   Cabbage Nausea And Vomiting   Codeine Other (See Comments)   unknown   Dolobid [diflunisal]    Unknown reaction   Fish Allergy Nausea And Vomiting   Fish-derived Products Nausea And Vomiting   Methadone    Unknown reaction   Pentazocine Other (See Comments)   Unknown  reaction   Propoxyphene Other (See Comments)   Unknown reaction   Shellfish Allergy Nausea And Vomiting   Sulfa Antibiotics Hives   Sulfasalazine Other (See Comments)   Unknown reaction   Vistaril [hydroxyzine]    Unknown reaction   Amoxicillin Rash   Per [facility]        Medication List     STOP taking these medications    colchicine 0.6 MG tablet   furosemide 40 MG tablet Commonly known as: LASIX   polyethylene glycol 17 g packet Commonly known as: MIRALAX / GLYCOLAX    senna-docusate 8.6-50 MG tablet Commonly known as: Senokot-S       TAKE these medications    acetaminophen 500 MG tablet Commonly known as: TYLENOL Take 2 tablets (1,000 mg total) by mouth every 8 (eight) hours.   allopurinol 100 MG tablet Commonly known as: ZYLOPRIM Take 100 mg by mouth daily.   amLODipine 10 MG tablet Commonly known as: NORVASC Take 1 tablet by mouth daily.   atorvastatin 80 MG tablet Commonly known as: LIPITOR Take 1 tablet (80 mg total) by mouth daily.   cloNIDine 0.2 MG tablet Commonly known as: CATAPRES Take 0.3 mg by mouth 3 (three) times daily.   clotrimazole 1 % cream Commonly known as: LOTRIMIN Apply topically 2 (two) times daily.   finasteride 5 MG tablet Commonly known as: PROSCAR Take 1 tablet (5 mg total) by mouth daily.   gabapentin 300 MG capsule Commonly known as: NEURONTIN Take 1 capsule (300 mg total) by mouth at bedtime. What changed:  when to take this Another medication with the same name was removed. Continue taking this medication, and follow the directions you see here.   glipiZIDE 10 MG tablet Commonly known as: GLUCOTROL Take 10 mg by mouth daily before breakfast.   hydrALAZINE 100 MG tablet Commonly known as: APRESOLINE Take 1 tablet (100 mg total) by mouth 3 (three) times daily.   lactulose 10 GM/15ML solution Commonly known as: CHRONULAC Take 15 mLs (10 g total) by mouth 2 (two) times daily.   levothyroxine 50 MCG tablet Commonly known as: SYNTHROID Take 50 mcg by mouth daily before breakfast.   methocarbamol 750 MG tablet Commonly known as: ROBAXIN Take 1 tablet (750 mg total) by mouth every 8 (eight) hours as needed for muscle spasms. What changed:  when to take this reasons to take this   metoprolol tartrate 100 MG tablet Commonly known as: LOPRESSOR Take 1 tablet (100 mg total) by mouth 2 (two) times daily.   naloxegol oxalate 25 MG Tabs tablet Commonly known as: MOVANTIK Take 1 tablet (25 mg  total) by mouth daily.   OxyCONTIN 30 MG 12 hr tablet Generic drug: oxyCODONE Take 1 tablet (30 mg total) by mouth 2 (two) times daily.   tamsulosin 0.4 MG Caps capsule Commonly known as: FLOMAX Take 1 capsule (0.4 mg total) by mouth daily.   vitamin B-12 250 MCG tablet Commonly known as: CYANOCOBALAMIN Take 250 mcg by mouth daily.   Zofran 4 MG tablet Generic drug: ondansetron Take 4 mg by mouth every 8 (eight) hours as needed for nausea or vomiting.       Allergies  Allergen Reactions   Other Other (See Comments)    Blood pressure issues Per [facility] hospital: Pt states he can only take these pain meds or else he gets very sick: Oxycontin,morphine,demerol, and dilaudid are the only pain meds pt states he can take.     Oxymorphone Other (See Comments)  Causes kidney problems   Aspirin Nausea And Vomiting    Per [facility]   Beta Vulgaris Nausea And Vomiting    Beets   Buspirone Other (See Comments)    unknown   Cabbage Nausea And Vomiting   Codeine Other (See Comments)    unknown   Dolobid [Diflunisal]     Unknown reaction   Fish Allergy Nausea And Vomiting   Fish-Derived Products Nausea And Vomiting   Methadone     Unknown reaction   Pentazocine Other (See Comments)    Unknown reaction   Propoxyphene Other (See Comments)    Unknown reaction   Shellfish Allergy Nausea And Vomiting   Sulfa Antibiotics Hives   Sulfasalazine Other (See Comments)    Unknown reaction   Vistaril [Hydroxyzine]     Unknown reaction   Amoxicillin Rash    Per [place] information for after-discharge care     Destination     [facility] Preferred SNF .   Service: Skilled Nursing Contact information: [address] [place] [phone]                      The results of significant diagnostics from this hospitalization (including imaging, microbiology, ancillary and laboratory) are listed below  for reference.    Significant Diagnostic Studies: DG Chest Port 1 View  Result Date: 06/02/2021 CLINICAL DATA:  Weakness.  Urination retention. EXAM: PORTABLE CHEST 1 VIEW COMPARISON:  October 05, 2020 FINDINGS: Cardiomegaly. The hila and mediastinum are unchanged. No pneumothorax. Infiltrate in the left mid lower lung. Mild interstitial prominence in the right upper lobe. No other acute abnormalities. IMPRESSION: Bilateral pulmonary opacities are identified. The appearance in the left base is most suggestive of pneumonia. Asymmetric edema is a possibility. Electronically Signed   By: Dorise Bullion III M.D.   On: 06/02/2021 17:22    Microbiology: Recent Results (from the past 240 hour(s))  Resp Panel by RT-PCR (Flu A&B, Covid) Nasopharyngeal Swab     Status: None   Collection Time: 06/02/21  4:26 PM   Specimen: Nasopharyngeal Swab; Nasopharyngeal(NP) swabs in vial transport medium  Result Value Ref Range Status   SARS Coronavirus 2 by RT PCR NEGATIVE NEGATIVE Final    Comment: (NOTE) SARS-CoV-2 target nucleic acids are NOT DETECTED.  The SARS-CoV-2 RNA is generally detectable in upper respiratory specimens during the acute phase of infection. The lowest concentration of SARS-CoV-2 viral copies this assay can detect is 138 copies/mL. A negative result does not preclude SARS-Cov-2 infection and should not be used as the sole basis for treatment or other patient management decisions. A negative result may occur with  improper specimen collection/handling, submission of specimen other than nasopharyngeal swab, presence of viral mutation(s) within the areas targeted by this assay, and inadequate number of viral copies(<138 copies/mL). A negative result must be combined with clinical observations, patient history, and epidemiological information. The expected result is Negative.  Fact Sheet for Patients:  EntrepreneurPulse.com.au  Fact Sheet for Healthcare Providers:   IncredibleEmployment.be  This test is no t yet approved or cleared by the [place] FDA and  has been authorized for detection and/or diagnosis of SARS-CoV-2 by FDA under an Emergency Use Authorization (EUA). This EUA will remain  in effect (meaning this test can be used) for the duration of the COVID-19 declaration under Section 564(b)(1) of the Act, 21 U.S.C.section 360bbb-3(b)(1), unless the authorization is terminated  or revoked sooner.  Influenza A by PCR NEGATIVE NEGATIVE Final   Influenza B by PCR NEGATIVE NEGATIVE Final    Comment: (NOTE) The Xpert Xpress SARS-CoV-2/FLU/RSV plus assay is intended as an aid in the diagnosis of influenza from Nasopharyngeal swab specimens and should not be used as a sole basis for treatment. Nasal washings and aspirates are unacceptable for Xpert Xpress SARS-CoV-2/FLU/RSV testing.  Fact Sheet for Patients: EntrepreneurPulse.com.au  Fact Sheet for Healthcare Providers: IncredibleEmployment.be  This test is not yet approved or cleared by the [place] FDA and has been authorized for detection and/or diagnosis of SARS-CoV-2 by FDA under an Emergency Use Authorization (EUA). This EUA will remain in effect (meaning this test can be used) for the duration of the COVID-19 declaration under Section 564(b)(1) of the Act, 21 U.S.C. section 360bbb-3(b)(1), unless the authorization is terminated or revoked.  Performed at [facility], [place] [address], [place], [place] [address]   Urine Culture     Status: None   Collection Time: 06/02/21  5:18 PM   Specimen: Urine, Clean Catch  Result Value Ref Range Status   Specimen Description   Final    URINE, CLEAN CATCH Performed at [facility], [place] [address] [place], [place] [address]    Special Requests   Final    NONE Performed at [facility], [place] [address],  [place], [place] [address]    Culture   Final    NO GROWTH Performed at [facility], [place] [address], [place], [place] [address]    Report Status 06/04/2021 FINAL  Final     Labs: Basic Metabolic Panel: Recent Labs  Lab 06/02/21 1108 06/03/21 0354 06/04/21 0331 06/05/21 0434  NA 138 138 140 140  K 5.0 4.5 4.3 4.5  CL 111 112* 112* 113*  CO2 17* 18* 20* 21*  GLUCOSE 173* 171* 126* 111*  BUN 49* 49* 50* 38*  CREATININE 3.74* 2.98* 2.82* 2.41*  CALCIUM 7.6* 7.4* 7.3* 7.7*   Liver Function Tests: Recent Labs  Lab 06/02/21 1108 06/03/21 0354  AST 22 21  ALT 19 18  ALKPHOS 97 95  BILITOT 0.6 0.7  PROT 6.9 6.6  ALBUMIN 3.3* 3.2*   No results for input(s): LIPASE, AMYLASE in the last 168 hours. No results for input(s): AMMONIA in the last 168 hours. CBC: Recent Labs  Lab 06/02/21 1108 06/03/21 0354 06/04/21 0331  WBC 6.3 5.0 4.8  NEUTROABS 4.9  --   --   HGB 9.6* 9.2* 9.3*  HCT 29.6* 28.6* 28.2*  MCV 100.0 99.7 99.6  PLT 142* 143* 160   Cardiac Enzymes: No results for input(s): CKTOTAL, CKMB, CKMBINDEX, TROPONINI in the last 168 hours. BNP: BNP (last 3 results) No results for input(s): BNP in the last 8760 hours.  ProBNP (last 3 results) No results for input(s): PROBNP in the last 8760 hours.  CBG: Recent Labs  Lab 06/05/21 0747 06/05/21 1200 06/05/21 1655 06/05/21 2114 06/06/21 0735  GLUCAP 112* 134* 156* 137* 119*       Signed:  Nita Sells MD   [facility] 06/06/2021, 10:01 AM

## 2021-06-06 NOTE — Progress Notes (Signed)
Called and delivered [facility] to [place] Nurse at the receiving facility. AVS printed and Script in chart.

## 2021-06-17 DIAGNOSIS — I361 Nonrheumatic tricuspid (valve) insufficiency: Secondary | ICD-10-CM

## 2021-09-16 ENCOUNTER — Inpatient Hospital Stay (HOSPITAL_COMMUNITY)
Admission: EM | Admit: 2021-09-16 | Discharge: 2021-10-01 | DRG: 682 | Disposition: A | Discharge status: Home Health | Payer: Medicare Other | Attending: Internal Medicine | Admitting: Internal Medicine

## 2021-09-16 ENCOUNTER — Emergency Department (HOSPITAL_COMMUNITY): Payer: Medicare Other

## 2021-09-16 ENCOUNTER — Other Ambulatory Visit: Payer: Self-pay

## 2021-09-16 ENCOUNTER — Encounter (HOSPITAL_COMMUNITY): Payer: Self-pay

## 2021-09-16 DIAGNOSIS — N1330 Unspecified hydronephrosis: Secondary | ICD-10-CM

## 2021-09-16 DIAGNOSIS — I161 Hypertensive emergency: Secondary | ICD-10-CM | POA: Diagnosis not present

## 2021-09-16 DIAGNOSIS — M1990 Unspecified osteoarthritis, unspecified site: Secondary | ICD-10-CM | POA: Diagnosis present

## 2021-09-16 DIAGNOSIS — E1122 Type 2 diabetes mellitus with diabetic chronic kidney disease: Secondary | ICD-10-CM

## 2021-09-16 DIAGNOSIS — I82412 Acute embolism and thrombosis of left femoral vein: Secondary | ICD-10-CM | POA: Diagnosis present

## 2021-09-16 DIAGNOSIS — N179 Acute kidney failure, unspecified: Secondary | ICD-10-CM | POA: Diagnosis not present

## 2021-09-16 DIAGNOSIS — Z91013 Allergy to seafood: Secondary | ICD-10-CM

## 2021-09-16 DIAGNOSIS — Z79899 Other long term (current) drug therapy: Secondary | ICD-10-CM

## 2021-09-16 DIAGNOSIS — J189 Pneumonia, unspecified organism: Secondary | ICD-10-CM

## 2021-09-16 DIAGNOSIS — E669 Obesity, unspecified: Secondary | ICD-10-CM | POA: Diagnosis present

## 2021-09-16 DIAGNOSIS — E1165 Type 2 diabetes mellitus with hyperglycemia: Secondary | ICD-10-CM | POA: Diagnosis not present

## 2021-09-16 DIAGNOSIS — Z981 Arthrodesis status: Secondary | ICD-10-CM

## 2021-09-16 DIAGNOSIS — E785 Hyperlipidemia, unspecified: Secondary | ICD-10-CM | POA: Diagnosis present

## 2021-09-16 DIAGNOSIS — E11649 Type 2 diabetes mellitus with hypoglycemia without coma: Secondary | ICD-10-CM | POA: Diagnosis present

## 2021-09-16 DIAGNOSIS — H04123 Dry eye syndrome of bilateral lacrimal glands: Secondary | ICD-10-CM | POA: Diagnosis present

## 2021-09-16 DIAGNOSIS — M2042 Other hammer toe(s) (acquired), left foot: Secondary | ICD-10-CM | POA: Diagnosis present

## 2021-09-16 DIAGNOSIS — D509 Iron deficiency anemia, unspecified: Secondary | ICD-10-CM | POA: Diagnosis present

## 2021-09-16 DIAGNOSIS — I5022 Chronic systolic (congestive) heart failure: Secondary | ICD-10-CM | POA: Diagnosis present

## 2021-09-16 DIAGNOSIS — I10 Essential (primary) hypertension: Secondary | ICD-10-CM | POA: Diagnosis present

## 2021-09-16 DIAGNOSIS — Z881 Allergy status to other antibiotic agents status: Secondary | ICD-10-CM

## 2021-09-16 DIAGNOSIS — Z22322 Carrier or suspected carrier of Methicillin resistant Staphylococcus aureus: Secondary | ICD-10-CM

## 2021-09-16 DIAGNOSIS — Z886 Allergy status to analgesic agent status: Secondary | ICD-10-CM

## 2021-09-16 DIAGNOSIS — Z95828 Presence of other vascular implants and grafts: Secondary | ICD-10-CM

## 2021-09-16 DIAGNOSIS — Z6833 Body mass index (BMI) 33.0-33.9, adult: Secondary | ICD-10-CM

## 2021-09-16 DIAGNOSIS — Z89511 Acquired absence of right leg below knee: Secondary | ICD-10-CM

## 2021-09-16 DIAGNOSIS — D539 Nutritional anemia, unspecified: Secondary | ICD-10-CM | POA: Diagnosis present

## 2021-09-16 DIAGNOSIS — I878 Other specified disorders of veins: Secondary | ICD-10-CM | POA: Diagnosis present

## 2021-09-16 DIAGNOSIS — R112 Nausea with vomiting, unspecified: Secondary | ICD-10-CM

## 2021-09-16 DIAGNOSIS — F40240 Claustrophobia: Secondary | ICD-10-CM | POA: Diagnosis present

## 2021-09-16 DIAGNOSIS — G894 Chronic pain syndrome: Secondary | ICD-10-CM | POA: Diagnosis present

## 2021-09-16 DIAGNOSIS — N400 Enlarged prostate without lower urinary tract symptoms: Secondary | ICD-10-CM | POA: Diagnosis present

## 2021-09-16 DIAGNOSIS — R4182 Altered mental status, unspecified: Secondary | ICD-10-CM

## 2021-09-16 DIAGNOSIS — J9601 Acute respiratory failure with hypoxia: Secondary | ICD-10-CM | POA: Diagnosis not present

## 2021-09-16 DIAGNOSIS — Z79890 Hormone replacement therapy (postmenopausal): Secondary | ICD-10-CM

## 2021-09-16 DIAGNOSIS — D7589 Other specified diseases of blood and blood-forming organs: Secondary | ICD-10-CM | POA: Diagnosis present

## 2021-09-16 DIAGNOSIS — E039 Hypothyroidism, unspecified: Secondary | ICD-10-CM | POA: Diagnosis present

## 2021-09-16 DIAGNOSIS — H109 Unspecified conjunctivitis: Secondary | ICD-10-CM | POA: Diagnosis present

## 2021-09-16 DIAGNOSIS — Z781 Physical restraint status: Secondary | ICD-10-CM

## 2021-09-16 DIAGNOSIS — Z79891 Long term (current) use of opiate analgesic: Secondary | ICD-10-CM

## 2021-09-16 DIAGNOSIS — Z0189 Encounter for other specified special examinations: Secondary | ICD-10-CM

## 2021-09-16 DIAGNOSIS — E119 Type 2 diabetes mellitus without complications: Secondary | ICD-10-CM

## 2021-09-16 DIAGNOSIS — G9341 Metabolic encephalopathy: Secondary | ICD-10-CM | POA: Diagnosis present

## 2021-09-16 DIAGNOSIS — Q6689 Other  specified congenital deformities of feet: Secondary | ICD-10-CM

## 2021-09-16 DIAGNOSIS — N401 Enlarged prostate with lower urinary tract symptoms: Secondary | ICD-10-CM | POA: Diagnosis present

## 2021-09-16 DIAGNOSIS — Z888 Allergy status to other drugs, medicaments and biological substances status: Secondary | ICD-10-CM

## 2021-09-16 DIAGNOSIS — Z978 Presence of other specified devices: Secondary | ICD-10-CM

## 2021-09-16 DIAGNOSIS — N184 Chronic kidney disease, stage 4 (severe): Secondary | ICD-10-CM | POA: Diagnosis present

## 2021-09-16 DIAGNOSIS — K219 Gastro-esophageal reflux disease without esophagitis: Secondary | ICD-10-CM | POA: Diagnosis present

## 2021-09-16 DIAGNOSIS — Z8673 Personal history of transient ischemic attack (TIA), and cerebral infarction without residual deficits: Secondary | ICD-10-CM

## 2021-09-16 DIAGNOSIS — E1140 Type 2 diabetes mellitus with diabetic neuropathy, unspecified: Secondary | ICD-10-CM | POA: Diagnosis present

## 2021-09-16 DIAGNOSIS — L97529 Non-pressure chronic ulcer of other part of left foot with unspecified severity: Secondary | ICD-10-CM | POA: Diagnosis present

## 2021-09-16 DIAGNOSIS — I130 Hypertensive heart and chronic kidney disease with heart failure and stage 1 through stage 4 chronic kidney disease, or unspecified chronic kidney disease: Secondary | ICD-10-CM | POA: Diagnosis present

## 2021-09-16 DIAGNOSIS — L309 Dermatitis, unspecified: Secondary | ICD-10-CM | POA: Diagnosis present

## 2021-09-16 DIAGNOSIS — R339 Retention of urine, unspecified: Secondary | ICD-10-CM | POA: Diagnosis present

## 2021-09-16 DIAGNOSIS — Z7984 Long term (current) use of oral hypoglycemic drugs: Secondary | ICD-10-CM

## 2021-09-16 DIAGNOSIS — Z7901 Long term (current) use of anticoagulants: Secondary | ICD-10-CM

## 2021-09-16 DIAGNOSIS — Z885 Allergy status to narcotic agent status: Secondary | ICD-10-CM

## 2021-09-16 DIAGNOSIS — Z91018 Allergy to other foods: Secondary | ICD-10-CM

## 2021-09-16 DIAGNOSIS — E860 Dehydration: Secondary | ICD-10-CM | POA: Diagnosis present

## 2021-09-16 DIAGNOSIS — E872 Acidosis, unspecified: Secondary | ICD-10-CM | POA: Diagnosis not present

## 2021-09-16 DIAGNOSIS — Z86711 Personal history of pulmonary embolism: Secondary | ICD-10-CM

## 2021-09-16 DIAGNOSIS — Z882 Allergy status to sulfonamides status: Secondary | ICD-10-CM

## 2021-09-16 DIAGNOSIS — R627 Adult failure to thrive: Secondary | ICD-10-CM | POA: Diagnosis present

## 2021-09-16 DIAGNOSIS — Z20822 Contact with and (suspected) exposure to covid-19: Secondary | ICD-10-CM | POA: Diagnosis present

## 2021-09-16 DIAGNOSIS — L03116 Cellulitis of left lower limb: Secondary | ICD-10-CM

## 2021-09-16 DIAGNOSIS — D631 Anemia in chronic kidney disease: Secondary | ICD-10-CM | POA: Diagnosis present

## 2021-09-16 DIAGNOSIS — E11621 Type 2 diabetes mellitus with foot ulcer: Secondary | ICD-10-CM | POA: Diagnosis present

## 2021-09-16 DIAGNOSIS — M109 Gout, unspecified: Secondary | ICD-10-CM | POA: Diagnosis present

## 2021-09-16 LAB — COMPREHENSIVE METABOLIC PANEL
ALT: 11 U/L (ref 0–44)
AST: 26 U/L (ref 15–41)
Albumin: 3.1 g/dL — ABNORMAL LOW (ref 3.5–5.0)
Alkaline Phosphatase: 82 U/L (ref 38–126)
Anion gap: 11 (ref 5–15)
BUN: 73 mg/dL — ABNORMAL HIGH (ref 8–23)
CO2: 20 mmol/L — ABNORMAL LOW (ref 22–32)
Calcium: 7.3 mg/dL — ABNORMAL LOW (ref 8.9–10.3)
Chloride: 104 mmol/L (ref 98–111)
Creatinine, Ser: 4.62 mg/dL — ABNORMAL HIGH (ref 0.61–1.24)
GFR, Estimated: 13 mL/min — ABNORMAL LOW (ref >60)
Glucose, Bld: 86 mg/dL (ref 70–99)
Potassium: 4.3 mmol/L (ref 3.5–5.1)
Sodium: 135 mmol/L (ref 135–145)
Total Bilirubin: 0.5 mg/dL (ref 0.3–1.2)
Total Protein: 8.2 g/dL — ABNORMAL HIGH (ref 6.5–8.1)

## 2021-09-16 LAB — CBG MONITORING, ED
Glucose-Capillary: 113 mg/dL — ABNORMAL HIGH (ref 70–99)
Glucose-Capillary: 52 mg/dL — ABNORMAL LOW (ref 70–99)

## 2021-09-16 LAB — CBC WITH DIFFERENTIAL/PLATELET
Abs Immature Granulocytes: 0.02 10*3/uL (ref 0.00–0.07)
Basophils Absolute: 0 10*3/uL (ref 0.0–0.1)
Basophils Relative: 0 %
Eosinophils Absolute: 0.1 10*3/uL (ref 0.0–0.5)
Eosinophils Relative: 1 %
HCT: 28.2 % — ABNORMAL LOW (ref 39.0–52.0)
Hemoglobin: 8.7 g/dL — ABNORMAL LOW (ref 13.0–17.0)
Immature Granulocytes: 0 %
Lymphocytes Relative: 10 %
Lymphs Abs: 0.7 10*3/uL (ref 0.7–4.0)
MCH: 31.4 pg (ref 26.0–34.0)
MCHC: 30.9 g/dL (ref 30.0–36.0)
MCV: 101.8 fL — ABNORMAL HIGH (ref 80.0–100.0)
Monocytes Absolute: 0.5 10*3/uL (ref 0.1–1.0)
Monocytes Relative: 7 %
Neutro Abs: 5.7 10*3/uL (ref 1.7–7.7)
Neutrophils Relative %: 82 %
Platelets: 228 10*3/uL (ref 150–400)
RBC: 2.77 MIL/uL — ABNORMAL LOW (ref 4.22–5.81)
RDW: 14.8 % (ref 11.5–15.5)
WBC: 7 10*3/uL (ref 4.0–10.5)
nRBC: 0 % (ref 0.0–0.2)

## 2021-09-16 LAB — RESP PANEL BY RT-PCR (FLU A&B, COVID) ARPGX2
Influenza A by PCR: NEGATIVE
Influenza B by PCR: NEGATIVE
SARS Coronavirus 2 by RT PCR: NEGATIVE

## 2021-09-16 LAB — LACTIC ACID, PLASMA: Lactic Acid, Venous: 0.8 mmol/L (ref 0.5–1.9)

## 2021-09-16 IMAGING — DX DG CHEST 1V PORT
1 series · 2 of 2 positions shown · non-contrast
Comparison: [DATE]

CLINICAL DATA: Shortness of breath

EXAM:
PORTABLE CHEST 1 VIEW

[Series 1: chest ap · 0.14mm/px · 2 of 2 slices shown]
[im 1/2]
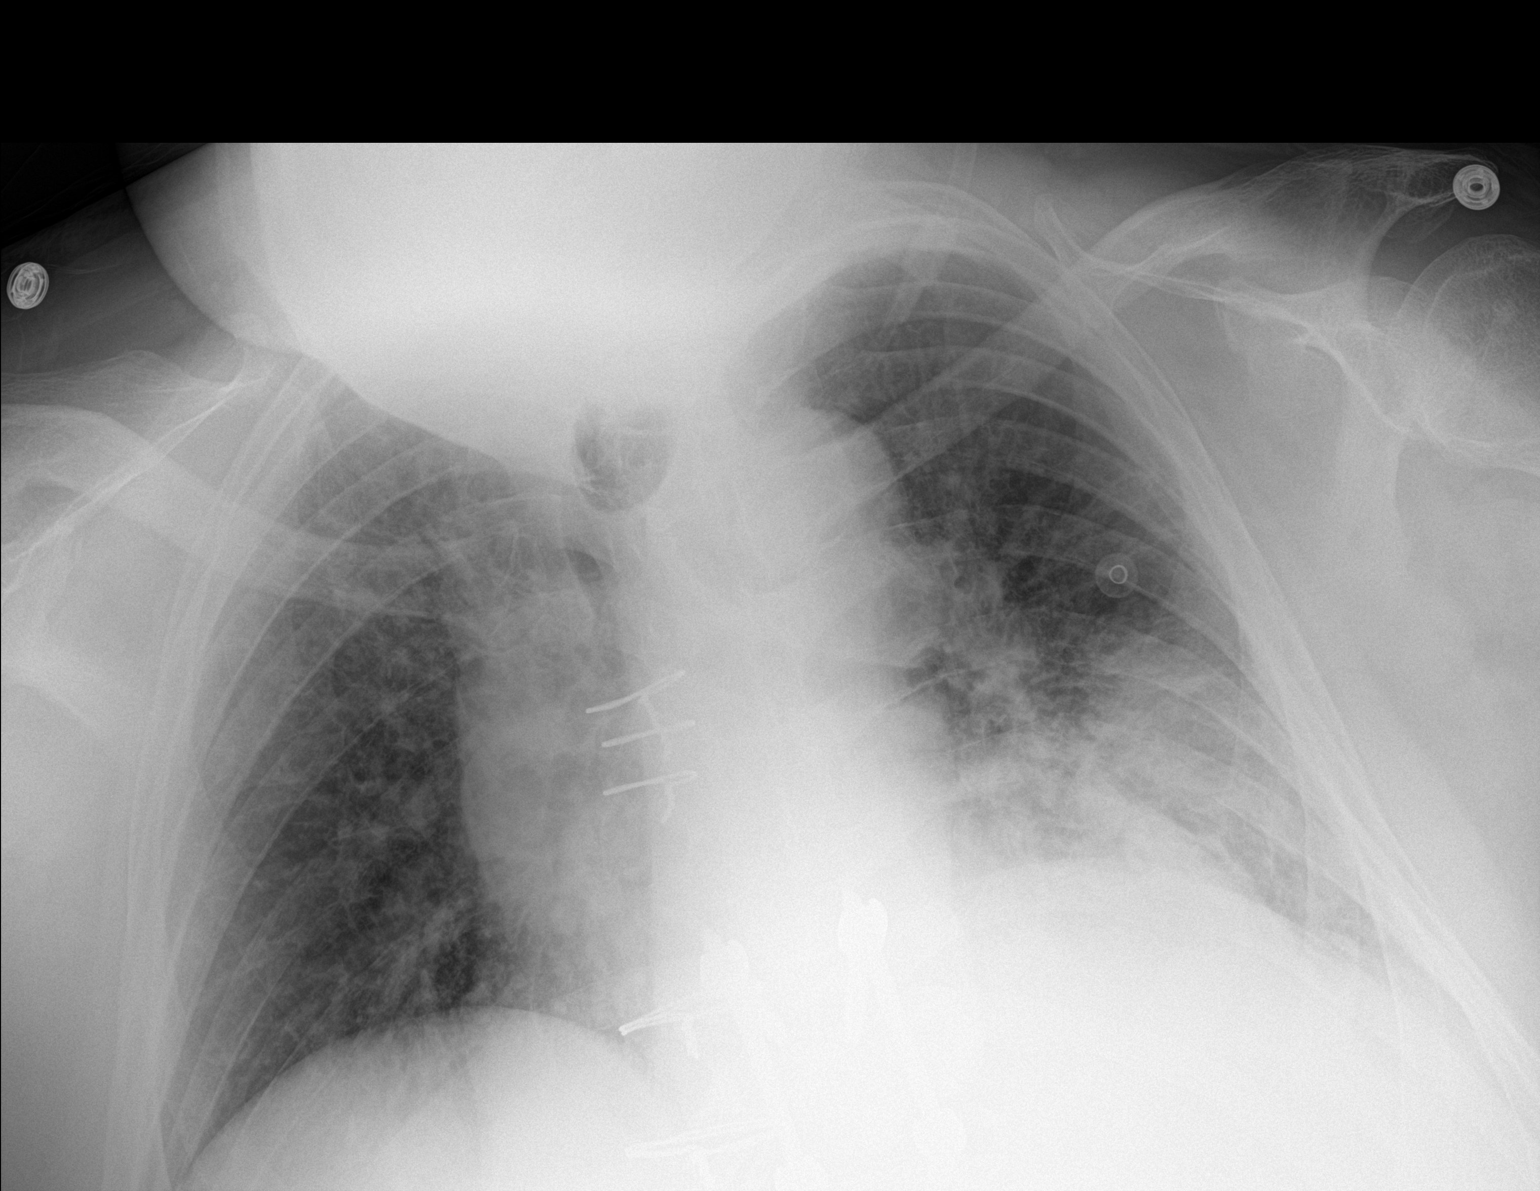
[im 2/2]
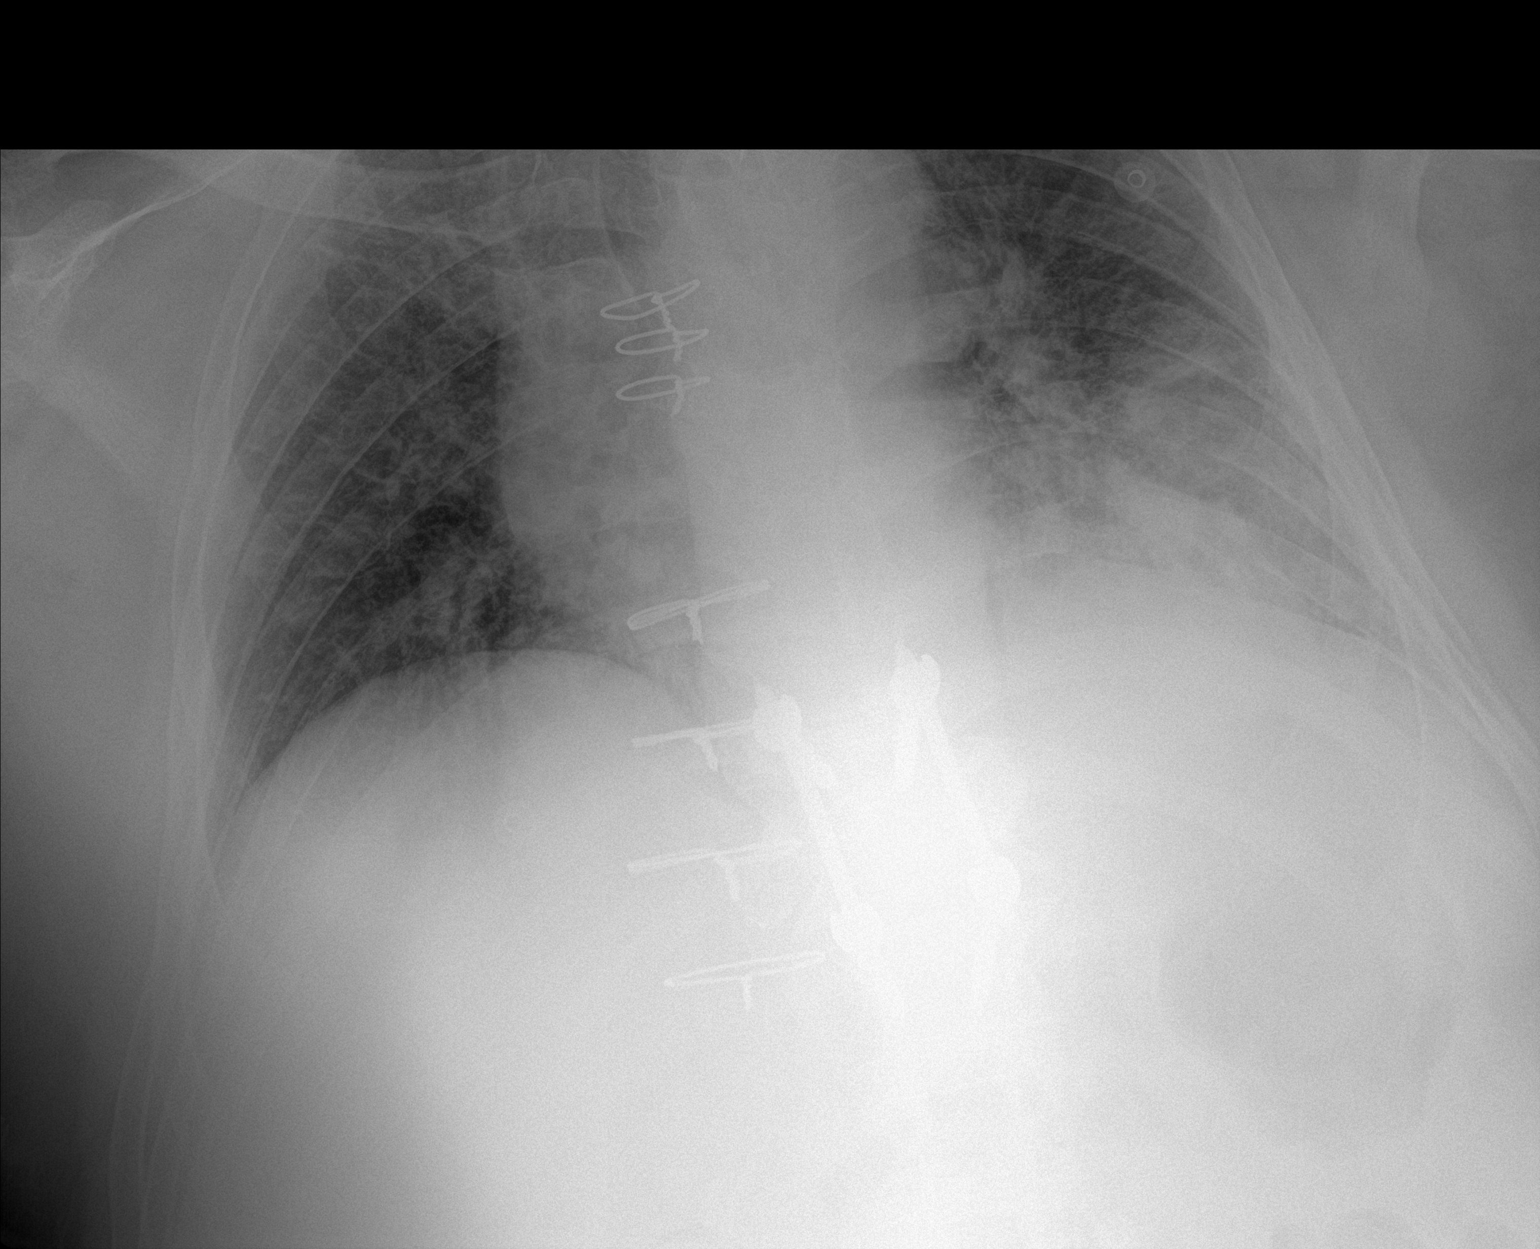

[2 of 2 positions shown; findings below may reference images not displayed]

FINDINGS: Left basilar consolidation is new since the prior study. Mild
cardiomegaly. Shallow lung inflation.
IMPRESSION: Left basilar consolidation, likely pneumonia.

## 2021-09-16 MED ORDER — SODIUM CHLORIDE 0.9 % IV SOLN
INTRAVENOUS | Status: AC
  Filled 2021-09-16: qty 20

## 2021-09-16 MED ORDER — VANCOMYCIN HCL 2000 MG/400ML IV SOLN
2000.0000 mg | Freq: Once | INTRAVENOUS | Status: AC
  Administered 2021-09-17: 2000 mg via INTRAVENOUS
  Filled 2021-09-16: qty 400

## 2021-09-16 MED ORDER — DEXTROSE 50 % IV SOLN
INTRAVENOUS | Status: AC
  Filled 2021-09-16: qty 50

## 2021-09-16 MED ORDER — DEXTROSE 50 % IV SOLN: 1.0000 | Freq: Once | INTRAVENOUS | Status: AC

## 2021-09-16 MED ORDER — SODIUM CHLORIDE 0.9 % IV SOLN
2.0000 g | Freq: Once | INTRAVENOUS | Status: AC
  Administered 2021-09-16: 2 g via INTRAVENOUS

## 2021-09-16 NOTE — ED Triage Notes (Signed)
Pt BIB EMS. Pt hasn't eating or taken his meds since Saturday. Pt's blood sugar upon EMS arrival 57. Last reading 98. Pt has a left leg infection, it is red and swollen.   25 g D10 Placed on 3 L O2 due to low O2 saturation Iv 18 right forearm

## 2021-09-16 NOTE — ED Notes (Signed)
Pt given sandwich 

## 2021-09-16 NOTE — ED Notes (Signed)
Patient was able to tolerate eating a sandwich.
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
## 2021-09-17 ENCOUNTER — Observation Stay (HOSPITAL_COMMUNITY): Payer: Medicare Other

## 2021-09-17 ENCOUNTER — Encounter (HOSPITAL_COMMUNITY): Payer: Self-pay | Admitting: Internal Medicine

## 2021-09-17 ENCOUNTER — Inpatient Hospital Stay (HOSPITAL_COMMUNITY): Payer: Medicare Other

## 2021-09-17 DIAGNOSIS — D631 Anemia in chronic kidney disease: Secondary | ICD-10-CM | POA: Diagnosis present

## 2021-09-17 DIAGNOSIS — E872 Acidosis, unspecified: Secondary | ICD-10-CM | POA: Diagnosis not present

## 2021-09-17 DIAGNOSIS — Z515 Encounter for palliative care: Secondary | ICD-10-CM | POA: Diagnosis not present

## 2021-09-17 DIAGNOSIS — Z794 Long term (current) use of insulin: Secondary | ICD-10-CM | POA: Diagnosis not present

## 2021-09-17 DIAGNOSIS — N179 Acute kidney failure, unspecified: Secondary | ICD-10-CM | POA: Diagnosis present

## 2021-09-17 DIAGNOSIS — R609 Edema, unspecified: Secondary | ICD-10-CM

## 2021-09-17 DIAGNOSIS — J189 Pneumonia, unspecified organism: Secondary | ICD-10-CM | POA: Diagnosis present

## 2021-09-17 DIAGNOSIS — G894 Chronic pain syndrome: Secondary | ICD-10-CM | POA: Diagnosis not present

## 2021-09-17 DIAGNOSIS — L03116 Cellulitis of left lower limb: Secondary | ICD-10-CM

## 2021-09-17 DIAGNOSIS — L0390 Cellulitis, unspecified: Secondary | ICD-10-CM | POA: Diagnosis not present

## 2021-09-17 DIAGNOSIS — Z20822 Contact with and (suspected) exposure to covid-19: Secondary | ICD-10-CM | POA: Diagnosis present

## 2021-09-17 DIAGNOSIS — Z6833 Body mass index (BMI) 33.0-33.9, adult: Secondary | ICD-10-CM | POA: Diagnosis not present

## 2021-09-17 DIAGNOSIS — N184 Chronic kidney disease, stage 4 (severe): Secondary | ICD-10-CM | POA: Diagnosis present

## 2021-09-17 DIAGNOSIS — I130 Hypertensive heart and chronic kidney disease with heart failure and stage 1 through stage 4 chronic kidney disease, or unspecified chronic kidney disease: Secondary | ICD-10-CM | POA: Diagnosis present

## 2021-09-17 DIAGNOSIS — D509 Iron deficiency anemia, unspecified: Secondary | ICD-10-CM | POA: Diagnosis present

## 2021-09-17 DIAGNOSIS — I5022 Chronic systolic (congestive) heart failure: Secondary | ICD-10-CM | POA: Diagnosis present

## 2021-09-17 DIAGNOSIS — E1169 Type 2 diabetes mellitus with other specified complication: Secondary | ICD-10-CM | POA: Diagnosis not present

## 2021-09-17 DIAGNOSIS — E1140 Type 2 diabetes mellitus with diabetic neuropathy, unspecified: Secondary | ICD-10-CM | POA: Diagnosis present

## 2021-09-17 DIAGNOSIS — Z89511 Acquired absence of right leg below knee: Secondary | ICD-10-CM | POA: Diagnosis not present

## 2021-09-17 DIAGNOSIS — E039 Hypothyroidism, unspecified: Secondary | ICD-10-CM | POA: Diagnosis present

## 2021-09-17 DIAGNOSIS — L538 Other specified erythematous conditions: Secondary | ICD-10-CM | POA: Diagnosis not present

## 2021-09-17 DIAGNOSIS — E1165 Type 2 diabetes mellitus with hyperglycemia: Secondary | ICD-10-CM | POA: Diagnosis not present

## 2021-09-17 DIAGNOSIS — E11649 Type 2 diabetes mellitus with hypoglycemia without coma: Secondary | ICD-10-CM | POA: Diagnosis present

## 2021-09-17 DIAGNOSIS — N189 Chronic kidney disease, unspecified: Secondary | ICD-10-CM | POA: Diagnosis present

## 2021-09-17 DIAGNOSIS — E669 Obesity, unspecified: Secondary | ICD-10-CM | POA: Diagnosis present

## 2021-09-17 DIAGNOSIS — J9601 Acute respiratory failure with hypoxia: Secondary | ICD-10-CM | POA: Diagnosis not present

## 2021-09-17 DIAGNOSIS — G9341 Metabolic encephalopathy: Secondary | ICD-10-CM | POA: Diagnosis present

## 2021-09-17 DIAGNOSIS — D539 Nutritional anemia, unspecified: Secondary | ICD-10-CM | POA: Diagnosis present

## 2021-09-17 DIAGNOSIS — E11621 Type 2 diabetes mellitus with foot ulcer: Secondary | ICD-10-CM | POA: Diagnosis present

## 2021-09-17 DIAGNOSIS — Z95828 Presence of other vascular implants and grafts: Secondary | ICD-10-CM | POA: Diagnosis not present

## 2021-09-17 DIAGNOSIS — R531 Weakness: Secondary | ICD-10-CM | POA: Diagnosis not present

## 2021-09-17 DIAGNOSIS — N400 Enlarged prostate without lower urinary tract symptoms: Secondary | ICD-10-CM | POA: Diagnosis not present

## 2021-09-17 DIAGNOSIS — E1122 Type 2 diabetes mellitus with diabetic chronic kidney disease: Secondary | ICD-10-CM | POA: Diagnosis present

## 2021-09-17 DIAGNOSIS — R0609 Other forms of dyspnea: Secondary | ICD-10-CM | POA: Diagnosis not present

## 2021-09-17 DIAGNOSIS — I82412 Acute embolism and thrombosis of left femoral vein: Secondary | ICD-10-CM | POA: Diagnosis present

## 2021-09-17 DIAGNOSIS — M86172 Other acute osteomyelitis, left ankle and foot: Secondary | ICD-10-CM | POA: Diagnosis not present

## 2021-09-17 DIAGNOSIS — M79605 Pain in left leg: Secondary | ICD-10-CM

## 2021-09-17 DIAGNOSIS — I161 Hypertensive emergency: Secondary | ICD-10-CM | POA: Diagnosis not present

## 2021-09-17 LAB — GLUCOSE, CAPILLARY: Glucose-Capillary: 131 mg/dL — ABNORMAL HIGH (ref 70–99)

## 2021-09-17 LAB — URINALYSIS, ROUTINE W REFLEX MICROSCOPIC
Bacteria, UA: NONE SEEN
Bilirubin Urine: NEGATIVE
Glucose, UA: NEGATIVE mg/dL
Hgb urine dipstick: NEGATIVE
Ketones, ur: NEGATIVE mg/dL
Nitrite: NEGATIVE
Protein, ur: 100 mg/dL — AB
Specific Gravity, Urine: 1.011 (ref 1.005–1.030)
pH: 5 (ref 5.0–8.0)

## 2021-09-17 LAB — FOLATE: Folate: 18 ng/mL (ref >5.9)

## 2021-09-17 LAB — IRON AND TIBC
Iron: 18 ug/dL — ABNORMAL LOW (ref 45–182)
Saturation Ratios: 9 % — ABNORMAL LOW (ref 17.9–39.5)
TIBC: 193 ug/dL — ABNORMAL LOW (ref 250–450)
UIBC: 175 ug/dL

## 2021-09-17 LAB — RETICULOCYTES
Immature Retic Fract: 18 % — ABNORMAL HIGH (ref 2.3–15.9)
RBC.: 2.78 MIL/uL — ABNORMAL LOW (ref 4.22–5.81)
Retic Count, Absolute: 44.8 10*3/uL (ref 19.0–186.0)
Retic Ct Pct: 1.6 % (ref 0.4–3.1)

## 2021-09-17 LAB — CBG MONITORING, ED
Glucose-Capillary: 41 mg/dL — CL (ref 70–99)
Glucose-Capillary: 37 mg/dL — CL (ref 70–99)
Glucose-Capillary: 51 mg/dL — ABNORMAL LOW (ref 70–99)
Glucose-Capillary: 53 mg/dL — ABNORMAL LOW (ref 70–99)
Glucose-Capillary: 59 mg/dL — ABNORMAL LOW (ref 70–99)
Glucose-Capillary: 167 mg/dL — ABNORMAL HIGH (ref 70–99)
Glucose-Capillary: 93 mg/dL (ref 70–99)
Glucose-Capillary: 124 mg/dL — ABNORMAL HIGH (ref 70–99)

## 2021-09-17 LAB — D-DIMER, QUANTITATIVE: D-Dimer, Quant: 4.41 ug/mL-FEU — ABNORMAL HIGH (ref 0.00–0.50)

## 2021-09-17 LAB — BRAIN NATRIURETIC PEPTIDE: B Natriuretic Peptide: 97.5 pg/mL (ref 0.0–100.0)

## 2021-09-17 LAB — FERRITIN: Ferritin: 124 ng/mL (ref 24–336)

## 2021-09-17 LAB — VITAMIN B12: Vitamin B-12: 796 pg/mL (ref 180–914)

## 2021-09-17 IMAGING — CT CT RENAL STONE PROTOCOL
2 of 7 series · 14 of 46 positions shown, 19 images · non-contrast
Comparison: [DATE]

CLINICAL DATA: Acute kidney injury

EXAM:
CT ABDOMEN AND PELVIS WITHOUT CONTRAST
TECHNIQUE: Multidetector CT imaging of the abdomen and pelvis was performed
following the standard protocol without IV contrast.

[Series 2: axial st · axial · 0.87mm/px · z∈[-416,-30]mm · 11 of 89 slices shown, 16 images]
[im 6/89  soft-tissue]
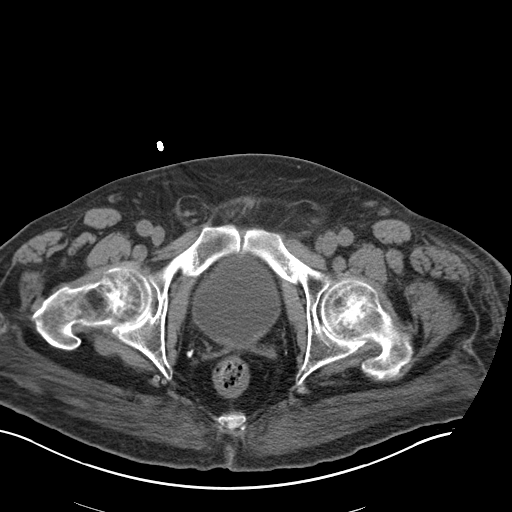
[im 6/89  bone]
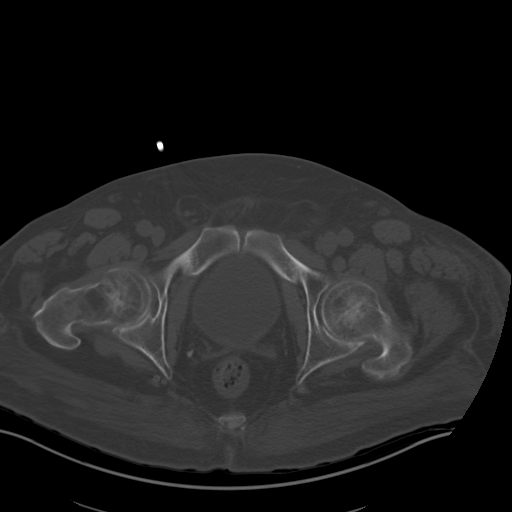
[im 16/89  soft-tissue]
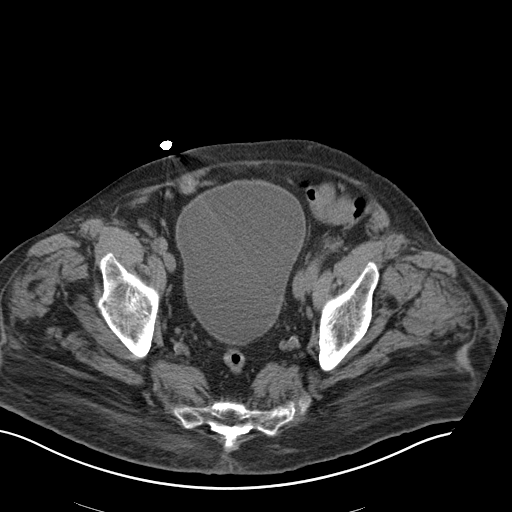
[im 26/89  soft-tissue]
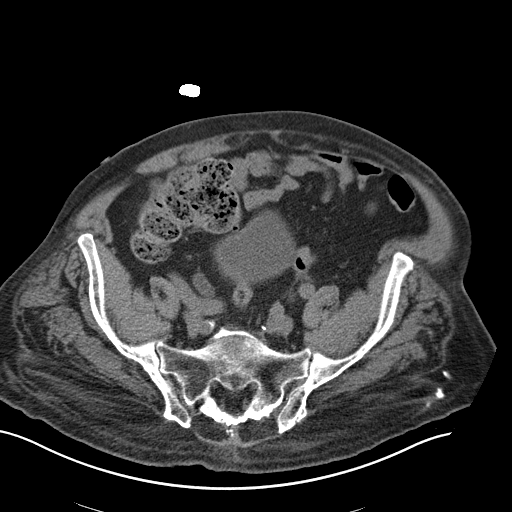
[im 32/89  soft-tissue]
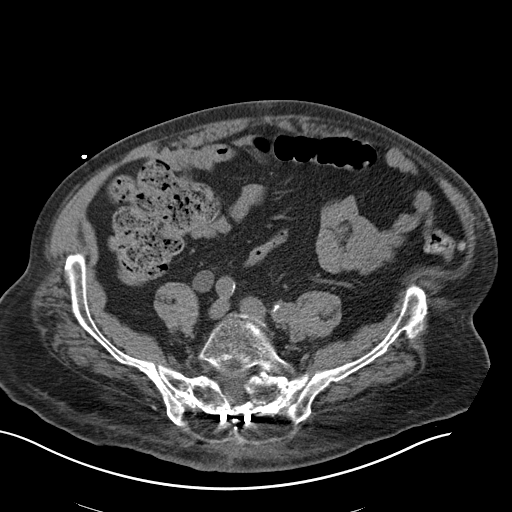
[im 42/89  soft-tissue]
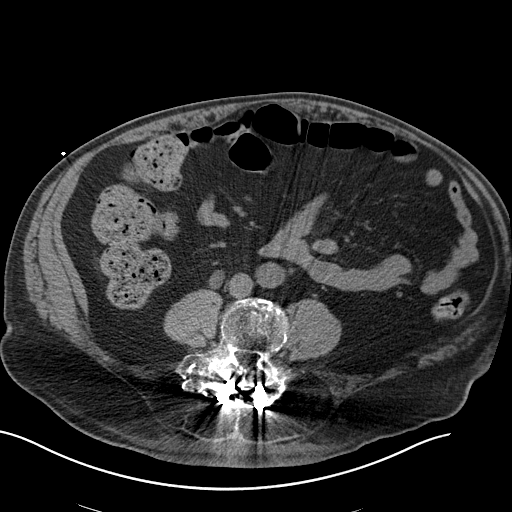
[im 47/89  soft-tissue]
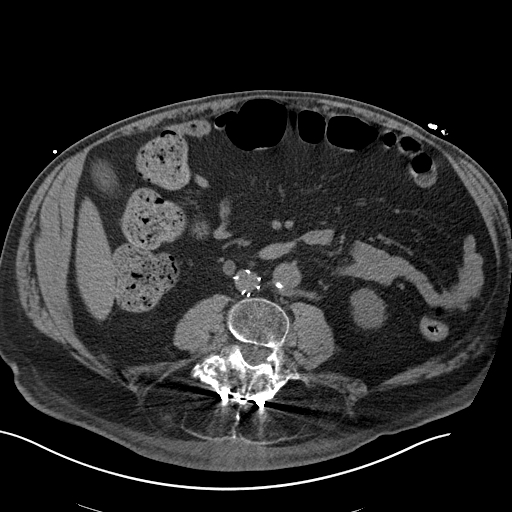
[im 57/89  soft-tissue]
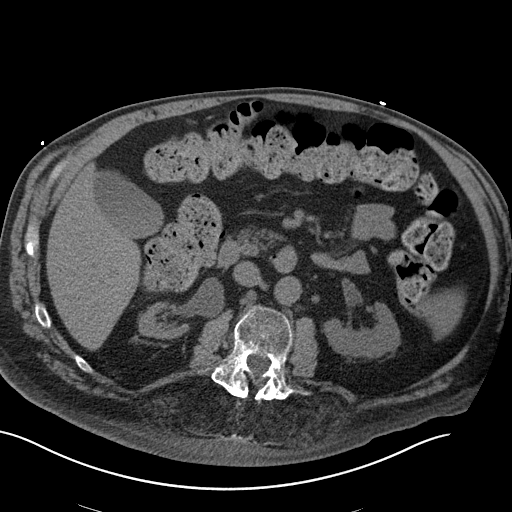
[im 68/89  soft-tissue]
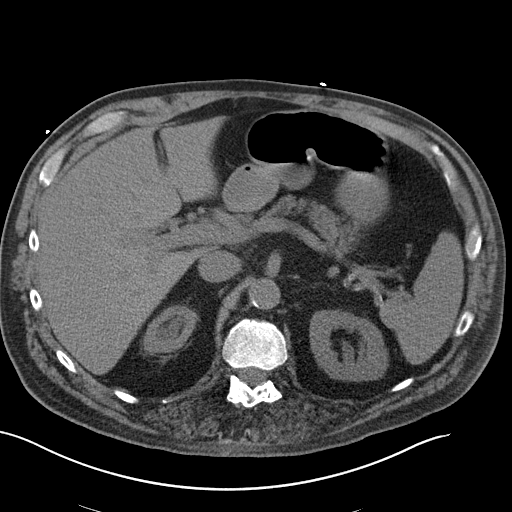
[im 68/89  lung]
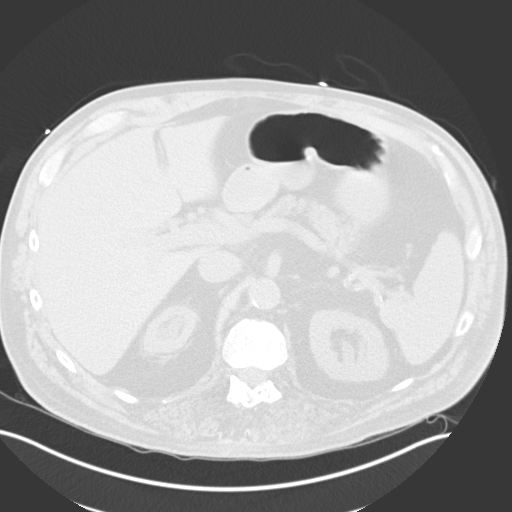
[im 73/89  soft-tissue]
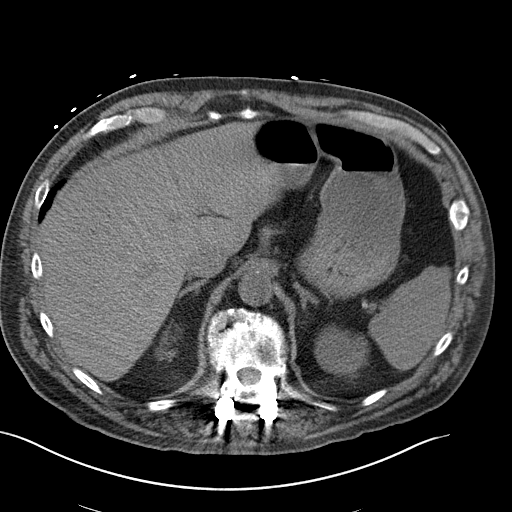
[im 73/89  lung]
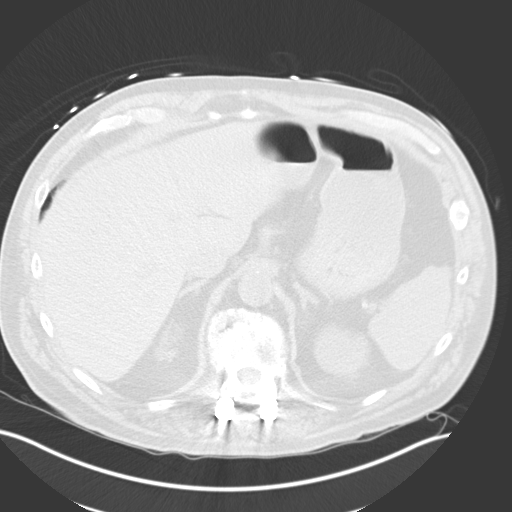
[im 73/89  bone]
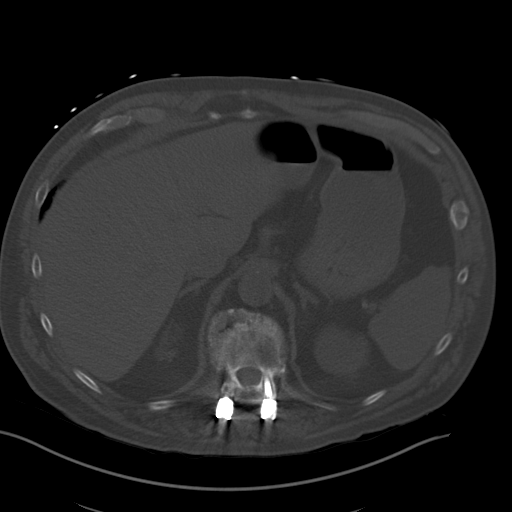
[im 78/89  lung]
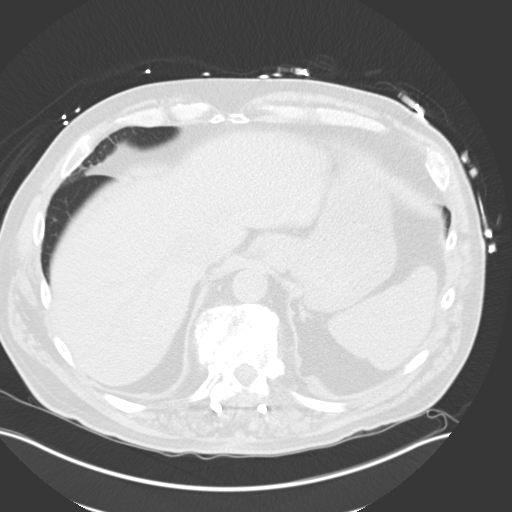
[im 83/89  soft-tissue]
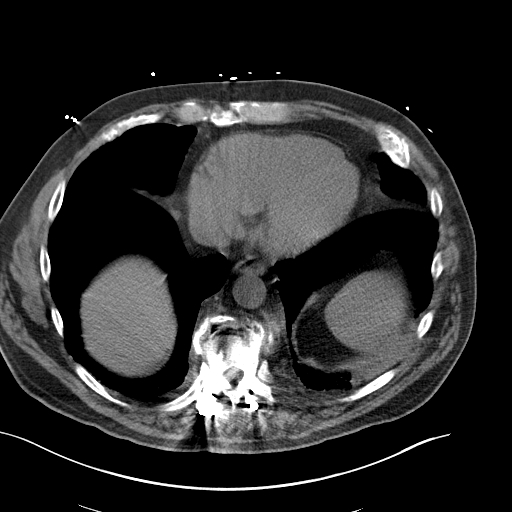
[im 83/89  lung]
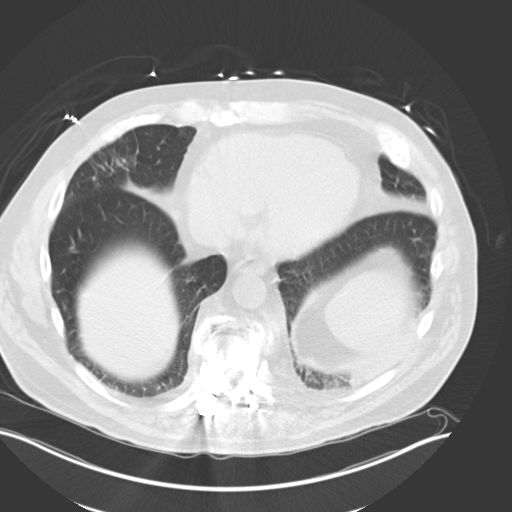

[Series 5: coronal · coronal · 0.84mm/px · 3 of 165 slices shown]
[im 33/165  soft-tissue]
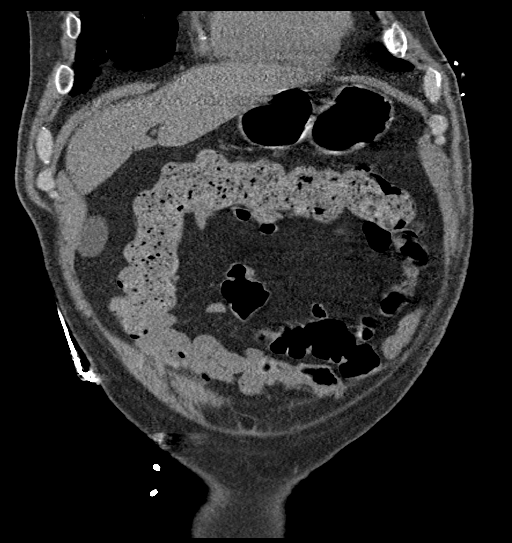
[im 66/165  soft-tissue]
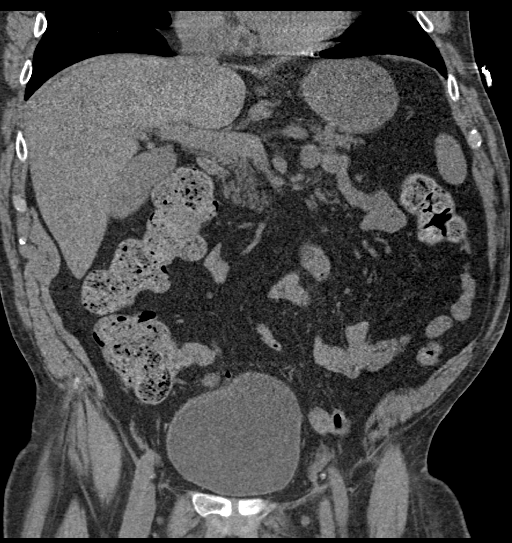
[im 99/165  soft-tissue]
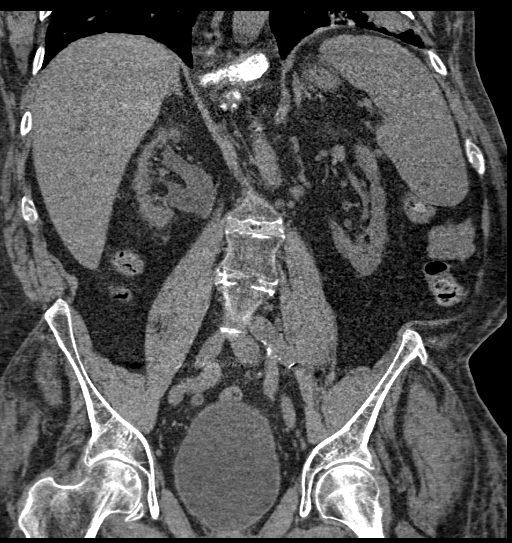

[14 of 46 positions shown; findings below may reference images not displayed]

FINDINGS: LOWER CHEST: Left basilar consolidation.

HEPATOBILIARY: Normal hepatic contours. No intra- or extrahepatic
biliary dilatation. The gallbladder is normal.

PANCREAS: Normal pancreas. No ductal dilatation or peripancreatic
fluid collection.

SPLEEN: Normal.

ADRENALS/URINARY TRACT: The adrenal glands are normal. The right
kidney is atrophic. There is moderate right hydroureteronephrosis.
The urinary bladder is normal for degree of distention

STOMACH/BOWEL: There is no hiatal hernia. Normal duodenal course and
caliber. No small bowel dilatation or inflammation. No focal colonic
abnormality. Normal appendix.

VASCULAR/LYMPHATIC: There is calcific aortic atherosclerosis. There
is an infrarenal IVC filter.

REPRODUCTIVE: Normal prostate size with symmetric seminal vesicles.

MUSCULOSKELETAL. No bony spinal canal stenosis or focal osseous
abnormality.

OTHER: None.
IMPRESSION: 1. Moderate right hydroureteronephrosis without obstructing stone or
mass.
2. Atrophic right kidney.
3. Left basilar consolidation, concerning for pneumonia.

Aortic Atherosclerosis ([P8]-[P8]).

## 2021-09-17 IMAGING — MR MR THORACIC SPINE W/O CM
6 series · 48 of 48 positions shown · non-contrast
Comparison: CT scan [DATE] and prior thoracic spine MRI
[DATE]

CLINICAL DATA: Back pain. History of thoracolumbar fusion. History
of chronic discitis and osteomyelitis at T10-11.

EXAM:
MRI THORACIC SPINE WITHOUT CONTRAST
TECHNIQUE: Multiplanar, multisequence MR imaging of the thoracic spine was
performed. No intravenous contrast was administered.

[Series 16: T1 · sagittal · 4.0mm · 1.72mm/px · 2 of 5 slices shown (1 of 2)]
[im 1/5]
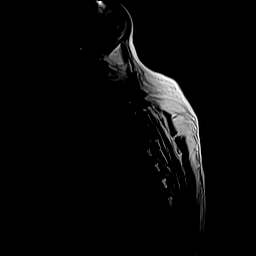
[im 5/5]
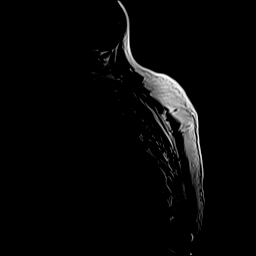

[Series 17: STIR · sagittal · 3.0mm · 1.09mm/px · 4 of 17 slices shown]
[im 1/17]
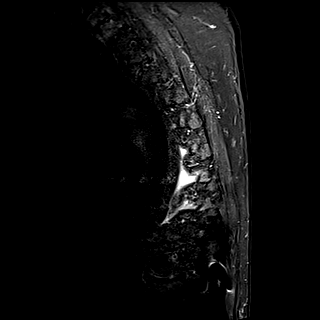
[im 6/17]
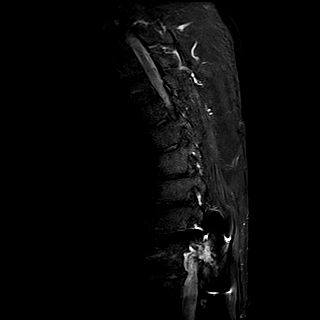
[im 11/17]
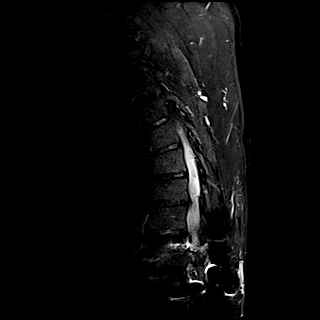
[im 17/17]
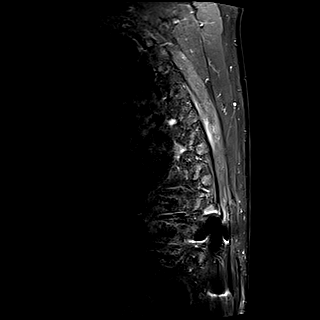

[Series 18: T1 · sagittal · 3.0mm · 1.09mm/px · 4 of 17 slices shown (2 of 2)]
[im 1/17]
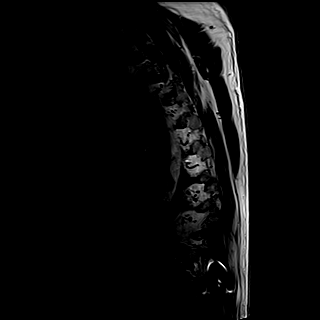
[im 6/17]
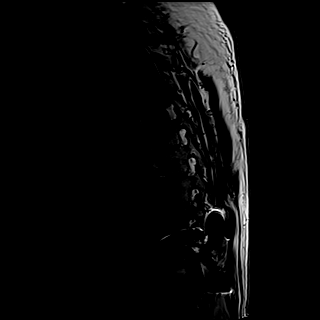
[im 11/17]
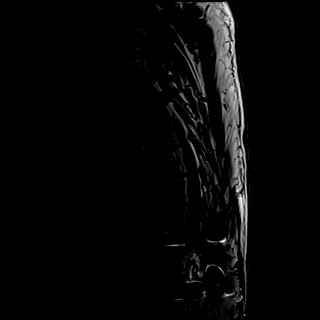
[im 17/17]
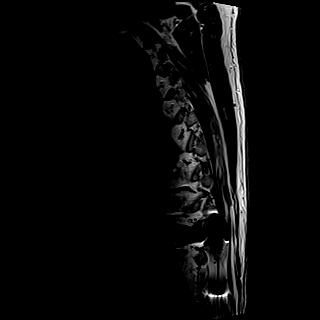

[Series 20: T2 · axial · 4.0mm · 1.04mm/px · z∈[-284,+57]mm · 17 of 69 slices shown (1 of 2)]
[im 1/69]
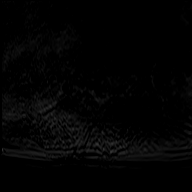
[im 5/69]
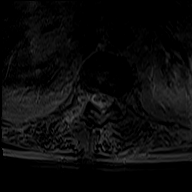
[im 9/69]
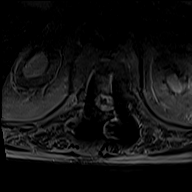
[im 13/69]
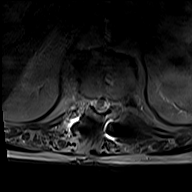
[im 18/69]
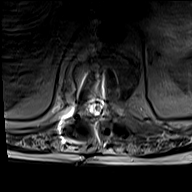
[im 22/69]
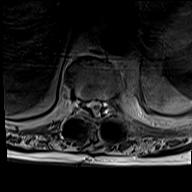
[im 26/69]
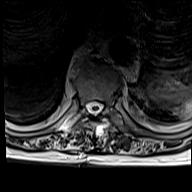
[im 30/69]
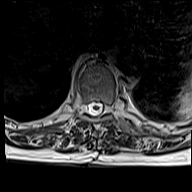
[im 35/69]
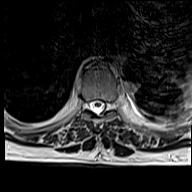
[im 39/69]
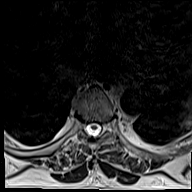
[im 43/69]
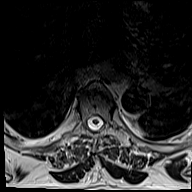
[im 47/69]
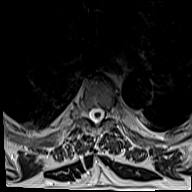
[im 52/69]
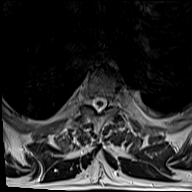
[im 56/69]
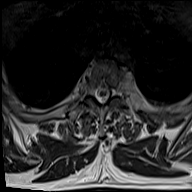
[im 60/69]
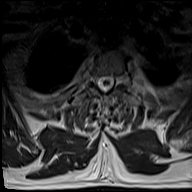
[im 64/69]
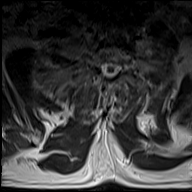
[im 69/69]
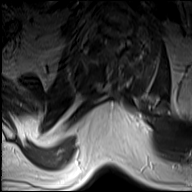

[Series 21: t2_me2d_tra · axial · 4.0mm · 0.52mm/px · z∈[-284,+57]mm · 17 of 69 slices shown]
[im 1/69]
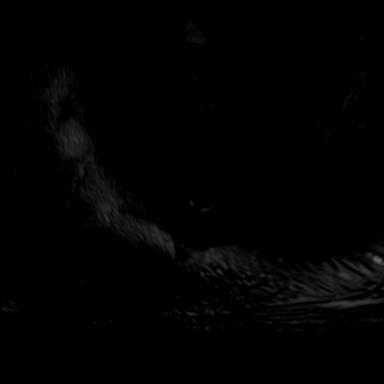
[im 5/69]
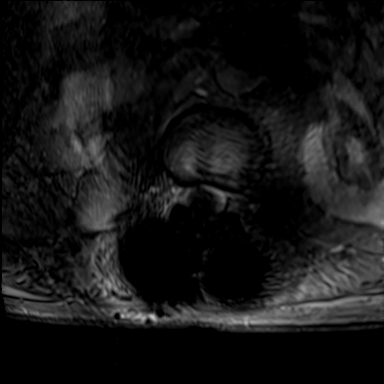
[im 9/69]
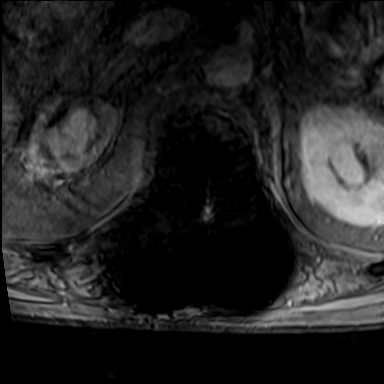
[im 13/69]
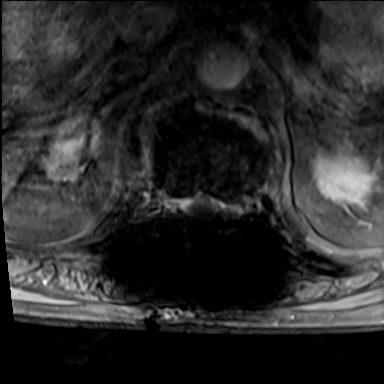
[im 18/69]
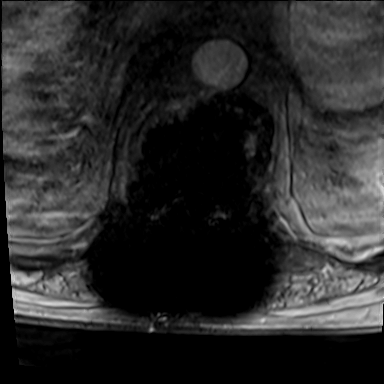
[im 22/69]
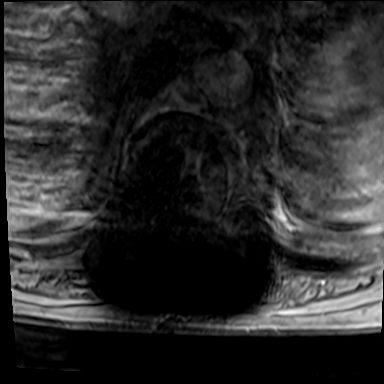
[im 26/69]
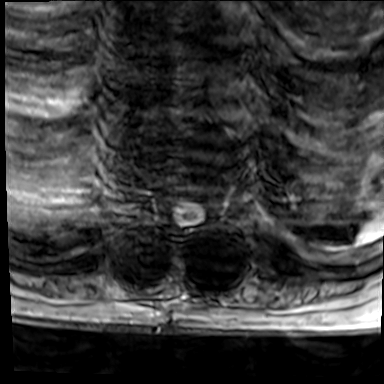
[im 30/69]
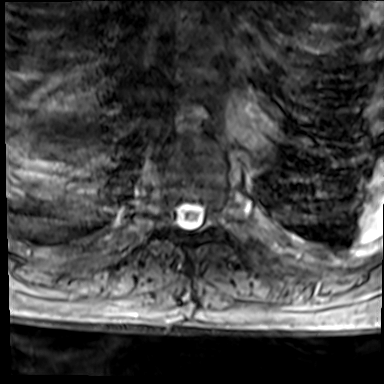
[im 35/69]
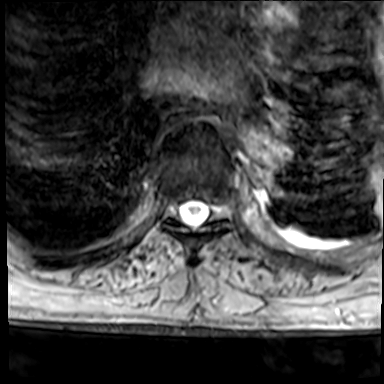
[im 39/69]
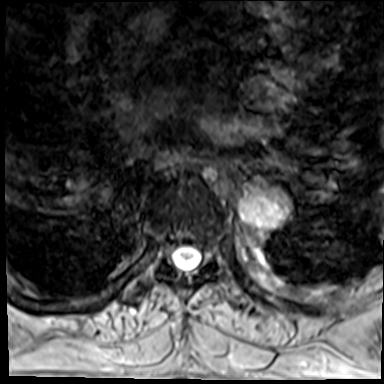
[im 43/69]
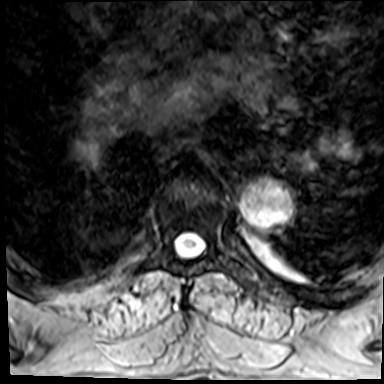
[im 47/69]
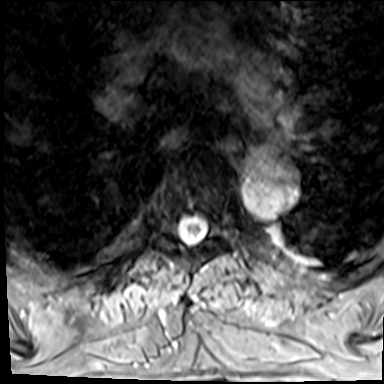
[im 52/69]
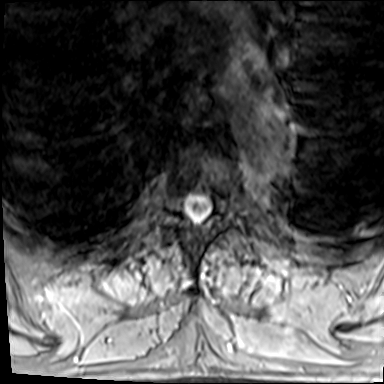
[im 56/69]
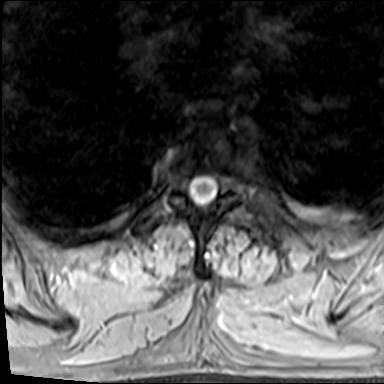
[im 60/69]
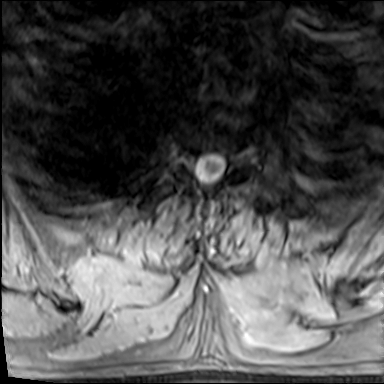
[im 64/69]
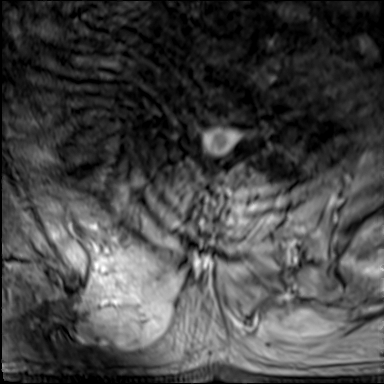
[im 69/69]
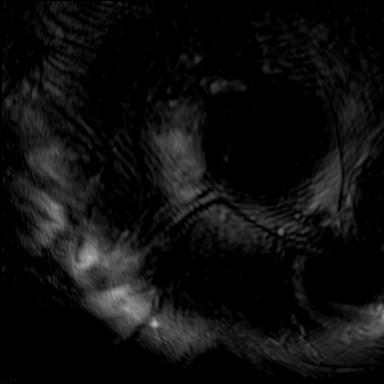

[Series 22: T2 · sagittal · 3.0mm · 0.91mm/px · 4 of 17 slices shown (2 of 2)]
[im 1/17]
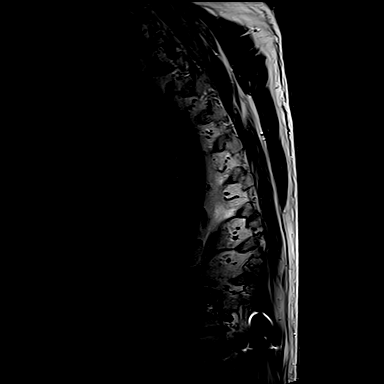
[im 6/17]
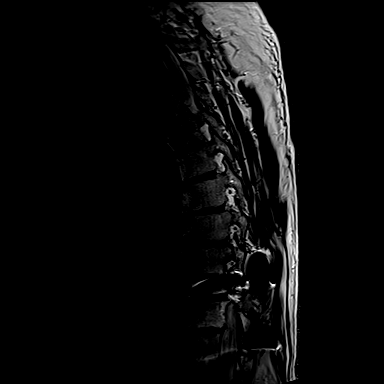
[im 11/17]
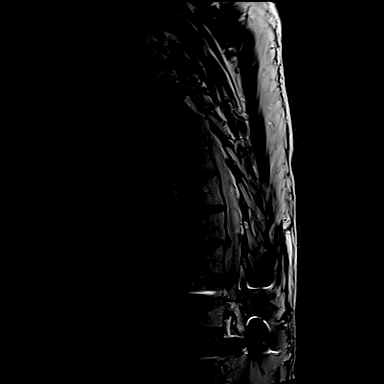
[im 17/17]
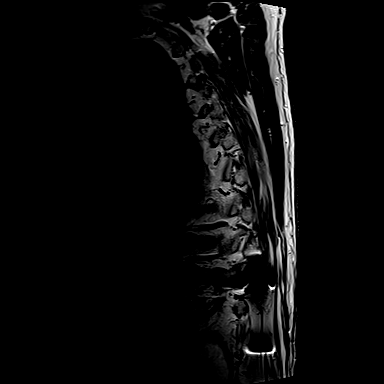

[48 of 48 positions shown; findings below may reference images not displayed]

FINDINGS: Examination is limited by artifact from the thoracolumbar hardware
and patient motion.

Alignment: Normal overall alignment.

Vertebrae: Normal marrow signal. No bone lesions or fractures.
Chronic changes at T10-11 but no findings suspicious for active
discitis or osteomyelitis.

Cord: Grossly normal cord signal intensity. No obvious cord lesions.

Paraspinal and other soft tissues: No significant paraspinal
findings. No paraspinal abscess.

Disc levels:

Postoperative changes with posterior fusion hardware from T10-T12.
No obvious complicating features are identified. No obvious spinal
or foraminal stenosis in this area.

Shallow left paracentral disc protrusion at T8-9 with mild
flattening of the thecal sac. No foraminal stenosis. No other
thoracic disc protrusions, spinal or foraminal stenosis identified.
IMPRESSION: 1. Postoperative changes with posterior fusion hardware from
T10-T12. No obvious complicating features are identified.
2. Chronic changes at T10-11 without findings suspicious for
recurrent/active discitis/osteomyelitis.
3. Shallow left paracentral disc protrusion at T8-9 with mild
flattening of the thecal sac.
4. No other thoracic disc protrusions, spinal or foraminal stenosis.

## 2021-09-17 IMAGING — MR MR FOOT*L* W/O CM
5 series · 39 of 40 positions shown · non-contrast
Comparison: MRI [DATE], radiograph [DATE]

CLINICAL DATA: Osteomyelitis, foot

EXAM:
MRI OF THE LEFT FOOT WITHOUT CONTRAST
TECHNIQUE: Multiplanar, multisequence MR imaging of the left foot was
performed. No intravenous contrast was administered.

[Series 4: T1 · coronal · left · 3.0mm · 0.55mm/px · 9 of 46 slices shown (1 of 2)]
[im 1/46]
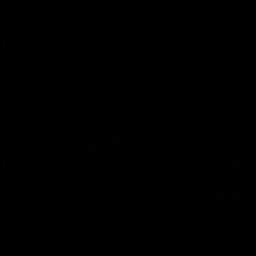
[im 6/46]
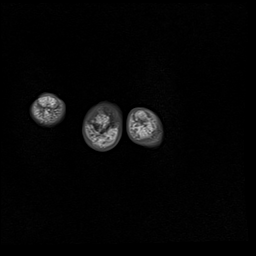
[im 12/46]
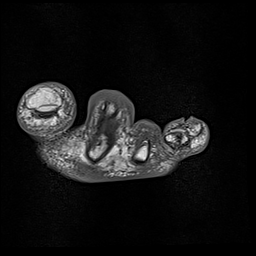
[im 17/46]
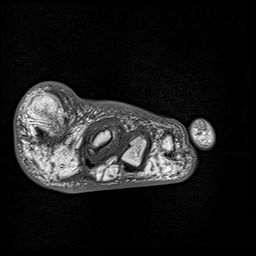
[im 23/46]
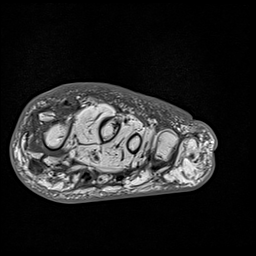
[im 29/46]
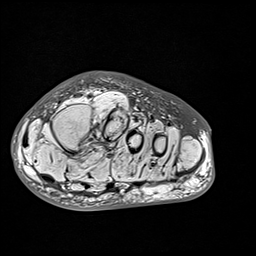
[im 34/46]
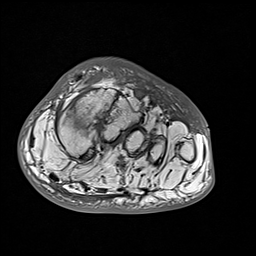
[im 40/46]
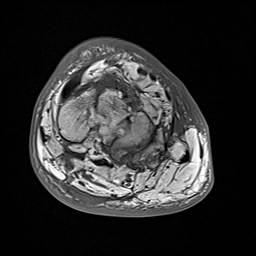
[im 46/46]
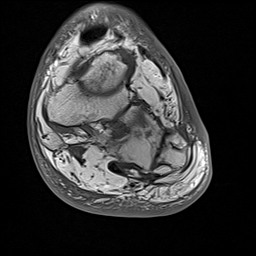

[Series 5: T2 fat-sat · coronal · left · 3.0mm · 0.44mm/px · 11 of 53 slices shown (1 of 2)]
[im 1/53]
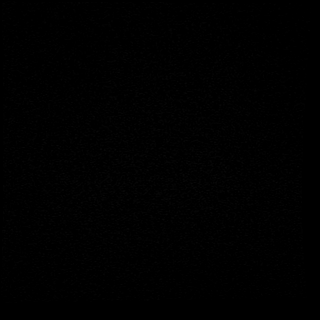
[im 6/53]
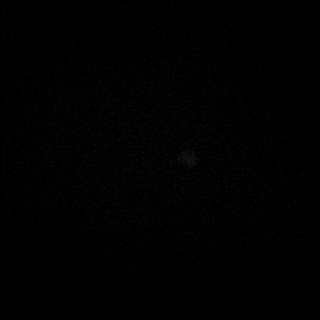
[im 11/53]
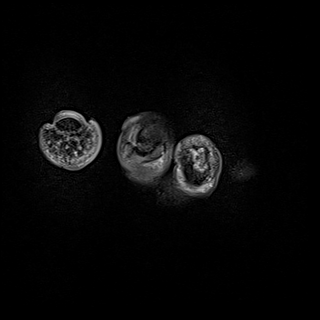
[im 16/53]
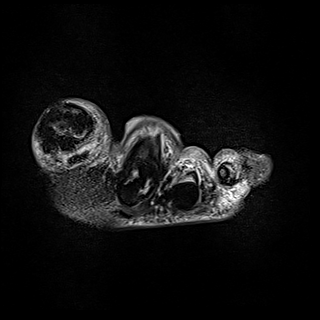
[im 21/53]
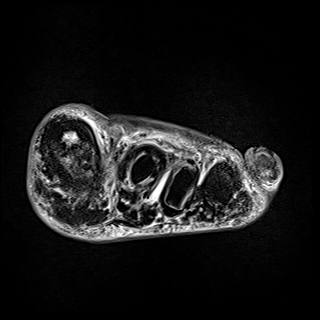
[im 27/53]
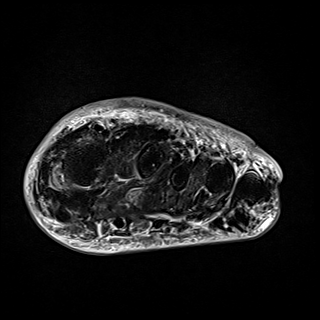
[im 32/53]
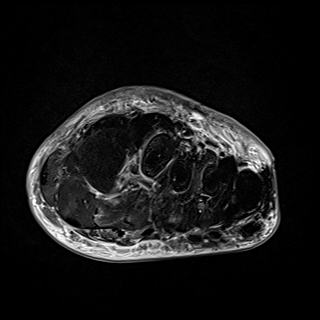
[im 37/53]
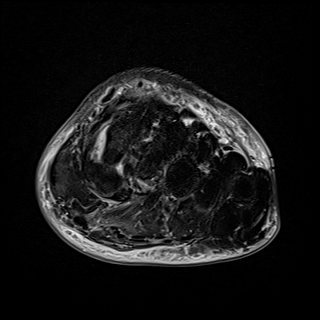
[im 42/53]
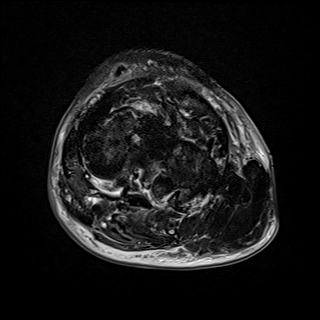
[im 47/53]
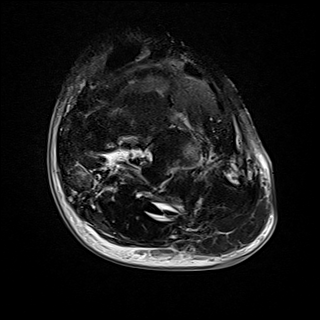
[im 53/53]
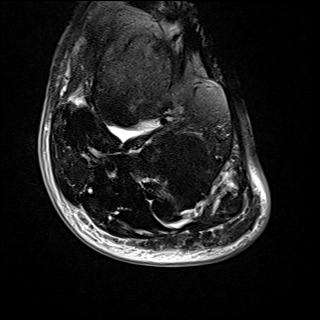

[Series 6: T2 fat-sat · axial · left · 3.0mm · 0.70mm/px · z∈[-90,+4]mm · 6 of 29 slices shown (2 of 2)]
[im 1/29]
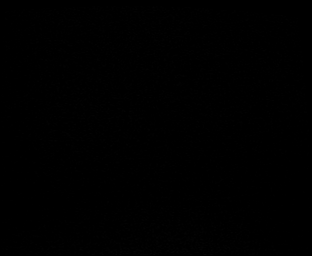
[im 6/29]
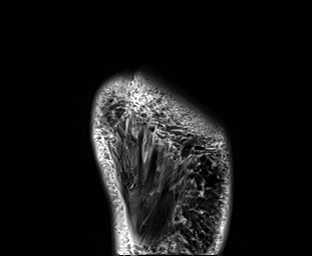
[im 12/29]
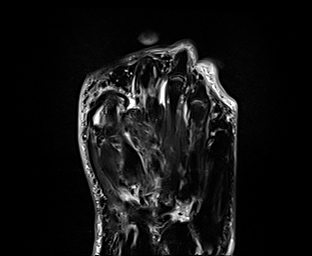
[im 17/29]
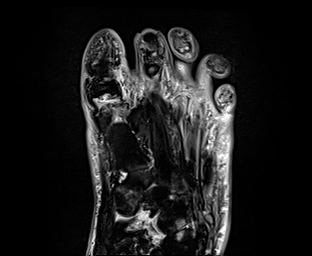
[im 23/29]
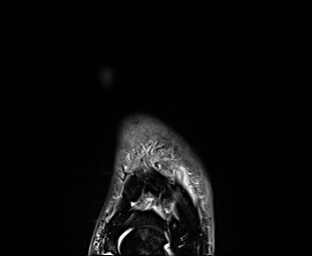
[im 29/29]
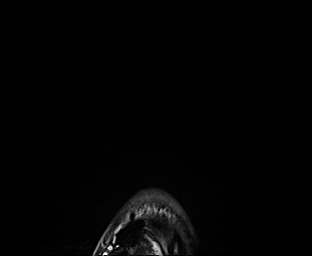

[Series 7: T1 · axial · left · 3.0mm · 0.70mm/px · z∈[-90,+4]mm · 6 of 29 slices shown (2 of 2)]
[im 1/29]
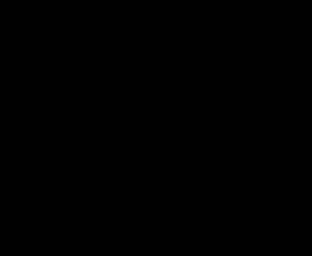
[im 6/29]
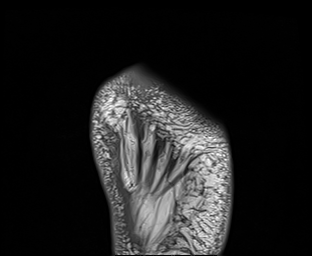
[im 12/29]
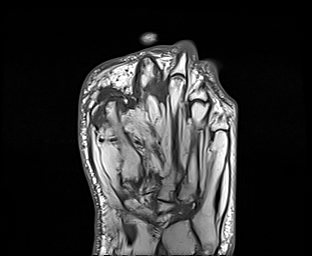
[im 17/29]
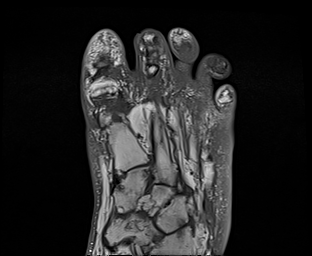
[im 23/29]
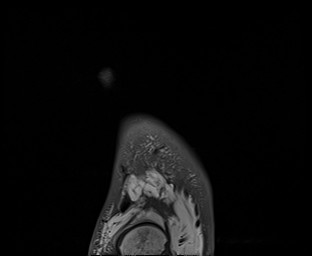
[im 29/29]
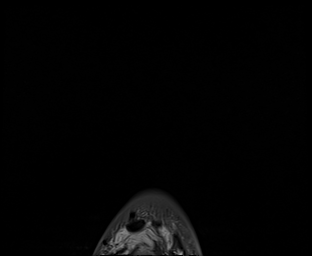

[Series 8: STIR · sagittal · left · 3.0mm · 0.35mm/px · 7 of 38 slices shown]
[im 1/38]
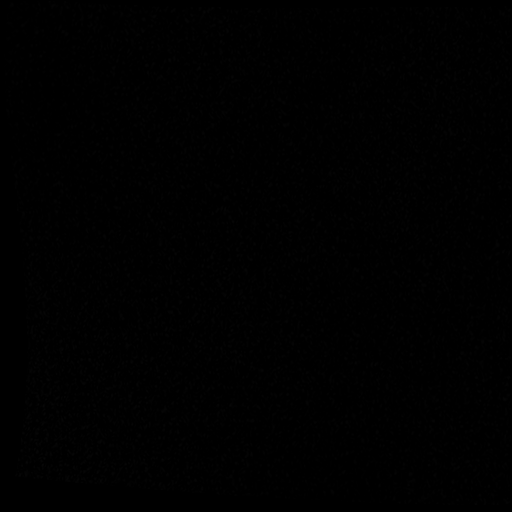
[im 6/38]
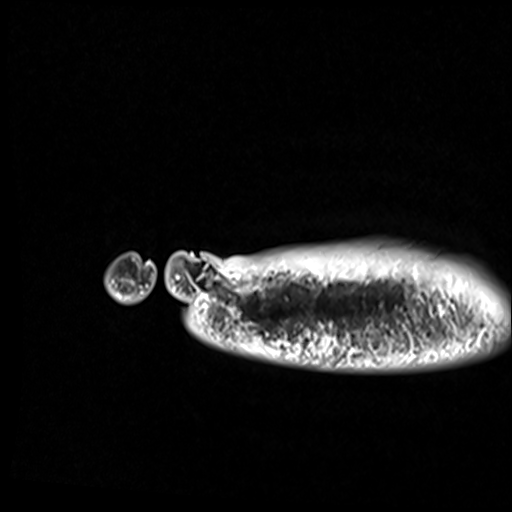
[im 11/38]
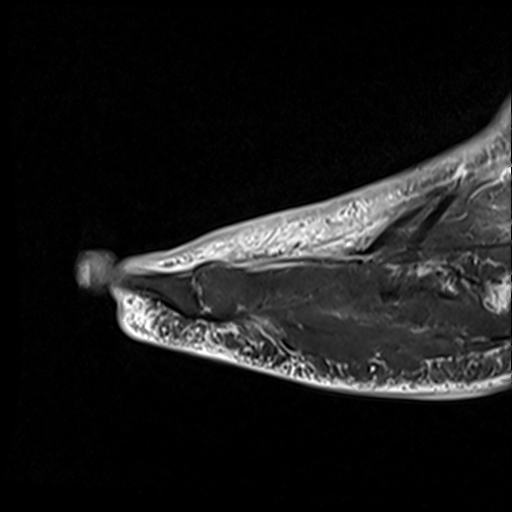
[im 16/38]
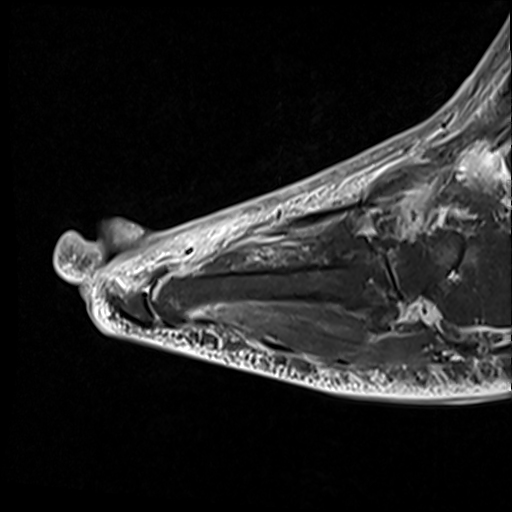
[im 22/38]
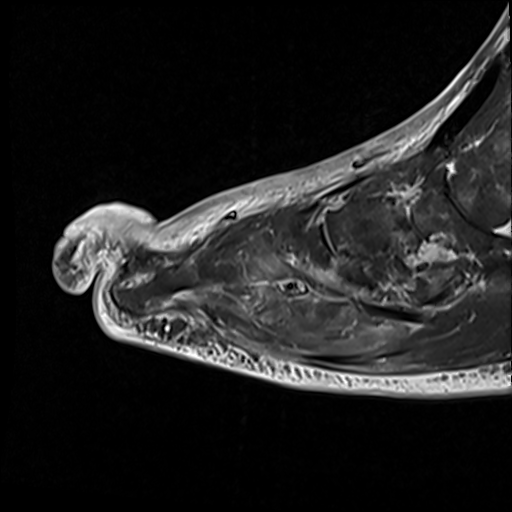
[im 27/38]
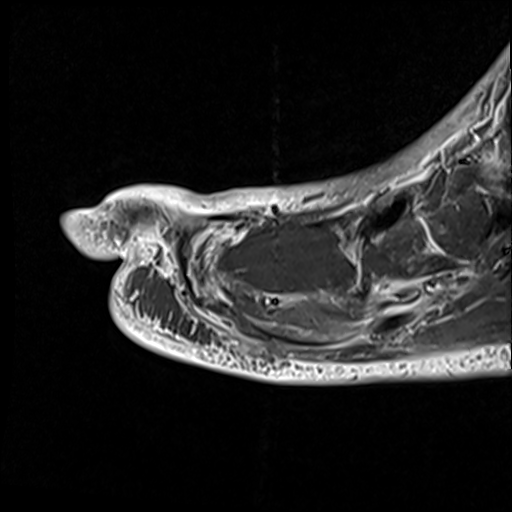
[im 32/38]
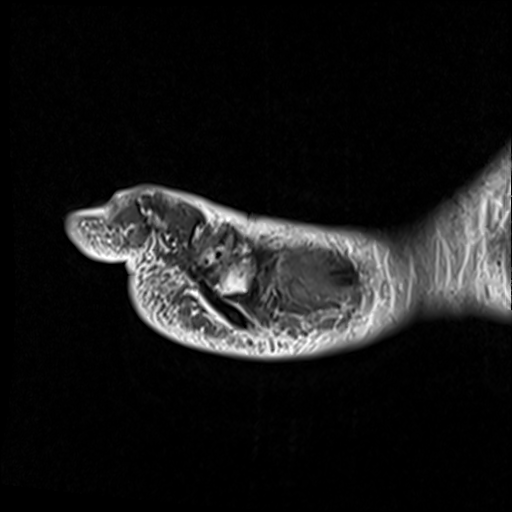

[39 of 40 positions shown; findings below may reference images not displayed]

FINDINGS: Bones/Joint/Cartilage

Unchanged severe arthritis with chronic erosive changes of the
distal first and second metatarsals and adjacent proximal phalanges.
Unchanged severe arthritis and erosive changes of the third through
fifth digits. Unchanged posttraumatic or severe arthritic deformity
of the distal fifth metatarsal. There is preserved T1 signal
throughout the foot. There is mild paratracheal bony edema within
the second digit phalanges.

Unchanged severe arthritis of the tarsometatarsal joints. There is
periarticular bony edema throughout the midfoot likely related to
arthritis.

Ligaments

Multiple disrupted MCP and interphalangeal joint ligaments.

Muscles and Tendons

Diffuse atrophy in the foot and swelling as is commonly seen in
diabetics.

Soft tissues

Diffuse soft tissue swelling. No focal fluid collection on
noncontrast exam.
IMPRESSION: Mild periarticular bony edema within the second digit phalanges with
preserved T1 marrow signal. This is favored to be related to severe
arthritis/reactive marrow change, though if there is an adjacent
soft tissue ulcer, early osteomyelitis would be possible. No other
evidence of osteomyelitis in the foot.

Diffuse soft tissue swelling. No evidence of soft tissue abscess on
noncontrast exam.

Unchanged severe arthritis in the midfoot and forefoot with chronic
erosive changes.

## 2021-09-17 MED ORDER — LORAZEPAM 2 MG/ML IJ SOLN
INTRAMUSCULAR | Status: AC
  Filled 2021-09-17: qty 1

## 2021-09-17 MED ORDER — ENOXAPARIN SODIUM 150 MG/ML IJ SOSY
130.0000 mg | PREFILLED_SYRINGE | INTRAMUSCULAR | Status: DC
Start: 2021-09-17 — End: 2021-09-19
  Administered 2021-09-17 – 2021-09-18 (×2): 130 mg via SUBCUTANEOUS
  Filled 2021-09-17 (×3): qty 0.86

## 2021-09-17 MED ORDER — ACETAMINOPHEN 325 MG PO TABS
650.0000 mg | ORAL_TABLET | Freq: Four times a day (QID) | ORAL | Status: DC | PRN
  Administered 2021-09-17: 650 mg via ORAL

## 2021-09-17 MED ORDER — COLCHICINE 0.6 MG PO TABS
0.6000 mg | ORAL_TABLET | ORAL | Status: DC
  Administered 2021-09-18 – 2021-09-20 (×2): 0.6 mg via ORAL
  Filled 2021-09-17 (×2): qty 1

## 2021-09-17 MED ORDER — SODIUM CHLORIDE 0.9 % IV SOLN
2.0000 g | INTRAVENOUS | Status: DC
  Administered 2021-09-17 – 2021-09-20 (×4): 2 g via INTRAVENOUS
  Filled 2021-09-17 (×4): qty 20

## 2021-09-17 MED ORDER — METOPROLOL TARTRATE 50 MG PO TABS
50.0000 mg | ORAL_TABLET | Freq: Two times a day (BID) | ORAL | Status: DC
  Administered 2021-09-17 (×2): 50 mg via ORAL
  Filled 2021-09-17 (×2): qty 1

## 2021-09-17 MED ORDER — METOPROLOL TARTRATE 25 MG PO TABS
ORAL_TABLET | ORAL | Status: AC
  Filled 2021-09-17: qty 2

## 2021-09-17 MED ORDER — LORAZEPAM 2 MG/ML IJ SOLN: 1.0000 mg | Freq: Once | INTRAMUSCULAR | Status: DC

## 2021-09-17 MED ORDER — SODIUM CHLORIDE 0.9 % IV SOLN
INTRAVENOUS | Status: AC
  Filled 2021-09-17: qty 500

## 2021-09-17 MED ORDER — GABAPENTIN 300 MG PO CAPS
ORAL_CAPSULE | ORAL | Status: AC
  Filled 2021-09-17: qty 1

## 2021-09-17 MED ORDER — AMLODIPINE BESYLATE 5 MG PO TABS
ORAL_TABLET | ORAL | Status: AC
  Filled 2021-09-17: qty 5

## 2021-09-17 MED ORDER — LEVOTHYROXINE SODIUM 50 MCG PO TABS
ORAL_TABLET | ORAL | Status: AC
  Filled 2021-09-17: qty 1

## 2021-09-17 MED ORDER — CLONIDINE HCL 0.1 MG PO TABS
ORAL_TABLET | ORAL | Status: AC
  Filled 2021-09-17: qty 3

## 2021-09-17 MED ORDER — OXYCODONE HCL ER 20 MG PO T12A
20.0000 mg | EXTENDED_RELEASE_TABLET | Freq: Two times a day (BID) | ORAL | Status: DC
  Administered 2021-09-17 – 2021-09-19 (×5): 20 mg via ORAL
  Filled 2021-09-17 (×4): qty 1

## 2021-09-17 MED ORDER — ORAL CARE MOUTH RINSE
15.0000 mL | Freq: Two times a day (BID) | OROMUCOSAL | Status: DC
  Administered 2021-09-17 – 2021-09-22 (×5): 15 mL via OROMUCOSAL

## 2021-09-17 MED ORDER — LORAZEPAM 2 MG/ML IJ SOLN
1.0000 mg | Freq: Once | INTRAMUSCULAR | Status: AC
  Administered 2021-09-17: 1 mg via INTRAVENOUS

## 2021-09-17 MED ORDER — VANCOMYCIN VARIABLE DOSE PER UNSTABLE RENAL FUNCTION (PHARMACIST DOSING)
Status: DC
Start: 2021-09-17 — End: 2021-09-19

## 2021-09-17 MED ORDER — ENOXAPARIN SODIUM 150 MG/ML IJ SOSY
130.0000 mg | PREFILLED_SYRINGE | Freq: Once | INTRAMUSCULAR | Status: AC
  Administered 2021-09-17: 130 mg via SUBCUTANEOUS
  Filled 2021-09-17: qty 0.86

## 2021-09-17 MED ORDER — ACETAMINOPHEN 650 MG RE SUPP: 650.0000 mg | Freq: Four times a day (QID) | RECTAL | Status: DC | PRN

## 2021-09-17 MED ORDER — AMLODIPINE BESYLATE 5 MG PO TABS: 2.5000 mg | ORAL_TABLET | Freq: Every day | ORAL | Status: DC

## 2021-09-17 MED ORDER — SODIUM CHLORIDE 0.9 % IV SOLN
500.0000 mg | INTRAVENOUS | Status: AC
  Administered 2021-09-17 – 2021-09-21 (×5): 500 mg via INTRAVENOUS
  Filled 2021-09-17 (×4): qty 500

## 2021-09-17 MED ORDER — INSULIN ASPART 100 UNIT/ML IJ SOLN
0.0000 [IU] | Freq: Three times a day (TID) | INTRAMUSCULAR | Status: DC
  Administered 2021-09-18 – 2021-09-19 (×3): 1 [IU] via SUBCUTANEOUS
  Administered 2021-09-23: 10:00:00 5 [IU] via SUBCUTANEOUS
  Administered 2021-09-23: 17:00:00 2 [IU] via SUBCUTANEOUS
  Administered 2021-09-23 – 2021-10-01 (×9): 1 [IU] via SUBCUTANEOUS
  Administered 2021-10-01: 2 [IU] via SUBCUTANEOUS
  Administered 2021-10-01: 3 [IU] via SUBCUTANEOUS
  Filled 2021-09-17: qty 0.09

## 2021-09-17 MED ORDER — TAMSULOSIN HCL 0.4 MG PO CAPS
0.4000 mg | ORAL_CAPSULE | Freq: Every day | ORAL | Status: DC
  Administered 2021-09-17 – 2021-10-01 (×11): 0.4 mg via ORAL
  Filled 2021-09-17 (×12): qty 1

## 2021-09-17 MED ORDER — LEVOTHYROXINE SODIUM 50 MCG PO TABS
50.0000 ug | ORAL_TABLET | Freq: Every day | ORAL | Status: DC
  Administered 2021-09-17 – 2021-09-21 (×5): 50 ug via ORAL
  Filled 2021-09-17 (×4): qty 1

## 2021-09-17 MED ORDER — ALLOPURINOL 100 MG PO TABS
50.0000 mg | ORAL_TABLET | ORAL | Status: DC
  Administered 2021-09-18 – 2021-09-20 (×2): 50 mg via ORAL
  Filled 2021-09-17 (×2): qty 1

## 2021-09-17 MED ORDER — SODIUM CHLORIDE 0.9 % IV SOLN: INTRAVENOUS | Status: AC

## 2021-09-17 MED ORDER — OXYCODONE HCL ER 10 MG PO T12A
EXTENDED_RELEASE_TABLET | ORAL | Status: AC
  Filled 2021-09-17: qty 2

## 2021-09-17 MED ORDER — CLONIDINE HCL 0.2 MG PO TABS
0.3000 mg | ORAL_TABLET | Freq: Three times a day (TID) | ORAL | Status: DC
  Administered 2021-09-17 (×3): 0.3 mg via ORAL
  Filled 2021-09-17 (×2): qty 1

## 2021-09-17 MED ORDER — ACETAMINOPHEN 325 MG PO TABS
ORAL_TABLET | ORAL | Status: AC
  Filled 2021-09-17: qty 2

## 2021-09-17 MED ORDER — NALOXEGOL OXALATE 12.5 MG PO TABS
12.5000 mg | ORAL_TABLET | Freq: Every day | ORAL | Status: DC
  Administered 2021-09-17 – 2021-09-20 (×4): 12.5 mg via ORAL
  Filled 2021-09-17 (×7): qty 1

## 2021-09-17 MED ORDER — LORAZEPAM 2 MG/ML IJ SOLN
0.5000 mg | Freq: Once | INTRAMUSCULAR | Status: DC
Start: 2021-09-17 — End: 2021-09-17

## 2021-09-17 MED ORDER — ATORVASTATIN CALCIUM 40 MG PO TABS
80.0000 mg | ORAL_TABLET | Freq: Every day | ORAL | Status: DC
  Administered 2021-09-17 – 2021-09-20 (×4): 80 mg via ORAL
  Filled 2021-09-17 (×6): qty 2

## 2021-09-17 MED ORDER — ATORVASTATIN CALCIUM 40 MG PO TABS
ORAL_TABLET | ORAL | Status: AC
  Filled 2021-09-17: qty 2

## 2021-09-17 MED ORDER — NALOXEGOL OXALATE 25 MG PO TABS
25.0000 mg | ORAL_TABLET | Freq: Every day | ORAL | Status: DC
  Filled 2021-09-17: qty 1

## 2021-09-17 MED ORDER — HYDRALAZINE HCL 50 MG PO TABS
100.0000 mg | ORAL_TABLET | Freq: Three times a day (TID) | ORAL | Status: DC
  Administered 2021-09-17 (×3): 100 mg via ORAL
  Filled 2021-09-17 (×6): qty 2

## 2021-09-17 MED ORDER — AMLODIPINE BESYLATE 5 MG PO TABS
25.0000 mg | ORAL_TABLET | Freq: Every day | ORAL | Status: DC
  Administered 2021-09-17: 25 mg via ORAL

## 2021-09-17 MED ORDER — TAMSULOSIN HCL 0.4 MG PO CAPS
ORAL_CAPSULE | ORAL | Status: AC
  Filled 2021-09-17: qty 1

## 2021-09-17 MED ORDER — FINASTERIDE 5 MG PO TABS
5.0000 mg | ORAL_TABLET | Freq: Every day | ORAL | Status: DC
  Administered 2021-09-18 – 2021-09-20 (×3): 5 mg via ORAL
  Filled 2021-09-17 (×6): qty 1

## 2021-09-17 MED ORDER — GABAPENTIN 300 MG PO CAPS
300.0000 mg | ORAL_CAPSULE | Freq: Every day | ORAL | Status: DC
  Administered 2021-09-17 – 2021-09-19 (×3): 300 mg via ORAL
  Filled 2021-09-17 (×2): qty 1

## 2021-09-17 MED ORDER — HYDRALAZINE HCL 25 MG PO TABS
ORAL_TABLET | ORAL | Status: AC
  Filled 2021-09-17: qty 4

## 2021-09-17 MED ORDER — VITAMIN B-12 100 MCG PO TABS
200.0000 ug | ORAL_TABLET | Freq: Every day | ORAL | Status: DC
  Administered 2021-09-18 – 2021-09-20 (×3): 200 ug via ORAL
  Filled 2021-09-17 (×7): qty 2

## 2021-09-17 MED ORDER — AMLODIPINE BESYLATE 10 MG PO TABS
10.0000 mg | ORAL_TABLET | Freq: Every day | ORAL | Status: DC
  Filled 2021-09-17: qty 1

## 2021-09-17 NOTE — Progress Notes (Signed)
PHARMACY NOTE: RENAL DOSAGE ADJUSTMENT  Renal Function:  Estimated Creatinine Clearance: 23.1 mL/min (A) (by C-G formula based on SCr of 4.62 mg/dL (H)).   adjusted allopurinol (100qd>50 qod)   colchicine (0.6 qd> 0.6 qod)  Naloxegol ([place]) 25 qd> 12.5 qday    Thank you for allowing pharmacy to be a part of this patient's care.  Eudelia Bunch, Pharm.D 09/17/2021 10:56 AM
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
## 2021-09-17 NOTE — ED Notes (Signed)
Patient transported to CT 

## 2021-09-17 NOTE — Progress Notes (Addendum)
[facility] PROGRESS NOTE    Progress Note  TAVORIS BRISK  KZS:[id number] DOB: 06/24/1951 DOA: 09/16/2021 PCP: Antonietta Jewel, MD     Brief Narrative:   Thomas [name] is an 70 y.o. male past medical history of diabetes mellitus type 2, essential hypertension, chronic kidney disease stage IV, chronic normocytic anemia, brought into the ED is found by brother now looking good.  Initially his blood glucose was 57 was given D10 renal function of 4.6 (about 3 months ago 2.4) CT of the kidneys showed right hydronephrosis without obstruction atrophic right kidney left basilar consolidation, chest x-ray showed left basilar consolidation, patient has not been eating for the last 3 days.  Assessment/Plan:   Acute kidney injury on chronic kidney disease stage IV: Likely prerenal, UA pending. Will start on IV fluid hydration repeat a basic metabolic panel tomorrow morning.  Community-acquired pneumonia/left lower lobe pneumonia: Has remained afebrile vitals are stable requiring 2 L of oxygen to keep saturations greater than 95%. No leukocytosis.   Was started empirically on Rocephin and azithromycin, procalcitonin is pending.  Left lower extremity swelling concerning for cellulitis. Dopplers have been ordered results are pending, will start empirically on Lovenox. MRI of the left foot was ordered to rule out osteomyelitis as he had his ulcer for more than 2 weeks with swelling and ulceration. Added Vanco for possible MRSA infection of the foot. Patient is claustrophobic we will use Ativan for sedation.    Low back pain with prior history of thoracic spine discitis:  MRI of the T-spine no oste or diskitis.  Diabetes mellitus type 2 with hyperglycemia: Hold oral hypoglycemic agents and insulin started on D10 on admission.  Essential hypertension: Blood pressure is elevated continue amlodipine, clonidine, hydralazine and metoprolol.  Anemia of chronic renal disease: With  macrocytosis. Anemia panel has been checked.  Chronic pain:\ Continue on MS Contin at a lower dose due to his renal failure.   ARF (acute renal failure) (HCC) Active Problems:   [name], right ([place])   [name], type 2) ([place])   HTN (hypertension)   BPH (benign prostatic hyperplasia)   Chronic pain syndrome   CAP (community acquired pneumonia)  RN Pressure Injury Documentation: Pressure Injury 06/03/21 Hip Anterior;Left Stage 1 -  Intact skin with non-blanchable redness of a localized area usually over a bony prominence. skin intact and has dark red bruising (Active)  06/03/21 0238  Location: Hip  Location Orientation: Anterior;Left  Staging: Stage 1 -  Intact skin with non-blanchable redness of a localized area usually over a bony prominence.  Wound Description (Comments): skin intact and has dark red bruising  Present on Admission: Yes     Pressure Injury 06/03/21 Penis Mid;Circumferential Stage 2 -  Partial thickness loss of dermis presenting as a shallow open injury with a red, pink wound bed without slough. swollen, red, and weeping with slight tear on top (Active)  06/03/21 0240  Location: Penis  Location Orientation: Mid;Circumferential  Staging: Stage 2 -  Partial thickness loss of dermis presenting as a shallow open injury with a red, pink wound bed without slough.  Wound Description (Comments): swollen, red, and weeping with slight tear on top  Present on Admission: Yes    Estimated body mass index is 33.17 kg/m as calculated from the following:   Height as of this encounter: 6\' 6"  (1.981 m).   Weight as of this encounter: 130.2 kg.    DVT prophylaxis: lovenox Family Communication:none Status is:  Observation  The patient will require care spanning > 2 midnights and should be moved to inpatient because: Due to acute kidney injury with a possibility of left foot osteomyelitis requiring surgical intervention.       Code Status:     Code  Status Orders  (From admission, onward)           Start     Ordered   09/17/21 0347  Full code  Continuous        09/17/21 0349           Code Status History     Date Active Date Inactive Code Status Order ID Comments User Context   06/02/2021 2155 06/07/2021 0017 Full Code [id number]  Geradine Girt, DO ED   08/30/2020 0853 10/03/2020 2226 Full Code [id number]  Mosetta Anis, MD ED   11/24/2019 0932 12/17/2019 1907 Full Code [id number]  Karmen Bongo, MD Inpatient   08/31/2019 0300 10/09/2019 1947 Full Code [id number]  [name], MD Inpatient         IV Access:   Peripheral IV   Procedures and diagnostic studies:   DG Chest Port 1 View  Result Date: 09/17/2021 CLINICAL DATA:  Shortness of breath EXAM: PORTABLE CHEST 1 VIEW COMPARISON:  08/24/2021 FINDINGS: Left basilar consolidation is new since the prior study. Mild cardiomegaly. Shallow lung inflation. IMPRESSION: Left basilar consolidation, likely pneumonia. Electronically Signed   By: Ulyses Jarred M.D.   On: 09/17/2021 00:01   CT Renal Stone Study  Result Date: 09/17/2021 CLINICAL DATA:  Acute kidney injury EXAM: CT ABDOMEN AND PELVIS WITHOUT CONTRAST TECHNIQUE: Multidetector CT imaging of the abdomen and pelvis was performed following the standard protocol without IV contrast. COMPARISON:  08/24/2021 FINDINGS: LOWER CHEST: Left basilar consolidation. HEPATOBILIARY: Normal hepatic contours. No intra- or extrahepatic biliary dilatation. The gallbladder is normal. PANCREAS: Normal pancreas. No ductal dilatation or peripancreatic fluid collection. SPLEEN: Normal. ADRENALS/URINARY TRACT: The adrenal glands are normal. The right kidney is atrophic. There is moderate right hydroureteronephrosis. The urinary bladder is normal for degree of distention STOMACH/BOWEL: There is no hiatal hernia. Normal duodenal course and caliber. No small bowel dilatation or inflammation. No focal colonic abnormality. Normal appendix.  VASCULAR/LYMPHATIC: There is calcific aortic atherosclerosis. There is an infrarenal IVC filter. REPRODUCTIVE: Normal prostate size with symmetric seminal vesicles. MUSCULOSKELETAL. No bony spinal canal stenosis or focal osseous abnormality. OTHER: None. IMPRESSION: 1. Moderate right hydroureteronephrosis without obstructing stone or mass. 2. Atrophic right kidney. 3. Left basilar consolidation, concerning for pneumonia. Aortic Atherosclerosis (ICD10-I70.0). Electronically Signed   By: Ulyses Jarred M.D.   On: 09/17/2021 01:39     Medical Consultants:   None.   Subjective:    KHADEEM ROCKETT denies any shortness of breath at rest or on ambulation  Objective:    Vitals:   09/17/21 0000 09/17/21 0400 09/17/21 0500 09/17/21 0600  BP: 135/81 (!) 152/70 (!) 154/88 (!) 157/93  Pulse: 80 81 74 83  Resp: 15 10 (!) 9 19  Temp:      TempSrc:      SpO2: 98% 97% 99% 96%  Weight:      Height:       SpO2: 96 % O2 Flow Rate (L/min): 2 L/min  No intake or output data in the 24 hours ending 09/17/21 0710 Filed Weights   09/16/21 2200  Weight: 130.2 kg    Exam: General exam: In no acute distress. Respiratory system: Good air movement and clear to auscultation. Cardiovascular  system: S1 & S2 heard, RRR. No JVD. Gastrointestinal system: Abdomen is nondistended, soft and nontender.  Extremities: Below the knee amputation on the right Skin: Left foot ulcer at the tip of the second toe with swelling and erythema with some chronic venous stasis changes Psychiatry: Judgement and insight appear normal. Mood & affect appropriate.    Data Reviewed:    Labs: Basic Metabolic Panel: Recent Labs  Lab 09/16/21 2245  NA 135  K 4.3  CL 104  CO2 20*  GLUCOSE 86  BUN 73*  CREATININE 4.62*  CALCIUM 7.3*   GFR Estimated Creatinine Clearance: 23.1 mL/min (A) (by C-G formula based on SCr of 4.62 mg/dL (H)). Liver Function Tests: Recent Labs  Lab 09/16/21 2245  AST 26  ALT 11  ALKPHOS 82   BILITOT 0.5  PROT 8.2*  ALBUMIN 3.1*   No results for input(s): LIPASE, AMYLASE in the last 168 hours. No results for input(s): AMMONIA in the last 168 hours. Coagulation profile No results for input(s): INR, PROTIME in the last 168 hours. COVID-19 Labs  Recent Labs    09/16/21 2245  DDIMER 4.41*    Lab Results  Component Value Date   SARSCOV2NAA NEGATIVE 09/16/2021   SARSCOV2NAA NEGATIVE 06/06/2021   SARSCOV2NAA NEGATIVE 06/02/2021   [place] NEGATIVE 10/05/2020    CBC: Recent Labs  Lab 09/16/21 2245  WBC 7.0  NEUTROABS 5.7  HGB 8.7*  HCT 28.2*  MCV 101.8*  PLT 228   Cardiac Enzymes: No results for input(s): CKTOTAL, CKMB, CKMBINDEX, TROPONINI in the last 168 hours. BNP (last 3 results) No results for input(s): PROBNP in the last 8760 hours. CBG: Recent Labs  Lab 09/16/21 2144 09/16/21 2207  GLUCAP 52* 113*   D-Dimer: Recent Labs    09/16/21 2245  DDIMER 4.41*   Hgb A1c: No results for input(s): HGBA1C in the last 72 hours. Lipid Profile: No results for input(s): CHOL, HDL, LDLCALC, TRIG, CHOLHDL, LDLDIRECT in the last 72 hours. Thyroid function studies: No results for input(s): TSH, T4TOTAL, T3FREE, THYROIDAB in the last 72 hours.  Invalid input(s): FREET3 Anemia work up: Recent Labs    09/17/21 0643  RETICCTPCT 1.6   Sepsis Labs: Recent Labs  Lab 09/16/21 2245  WBC 7.0  LATICACIDVEN 0.8   Microbiology Recent Results (from the past 240 hour(s))  Resp Panel by RT-PCR (Flu A&B, Covid)     Status: None   Collection Time: 09/16/21 10:45 PM   Specimen: Nasopharyngeal(NP) swabs in vial transport medium  Result Value Ref Range Status   SARS Coronavirus 2 by RT PCR NEGATIVE NEGATIVE Final    Comment: (NOTE) SARS-CoV-2 target nucleic acids are NOT DETECTED.  The SARS-CoV-2 RNA is generally detectable in upper respiratory specimens during the acute phase of infection. The lowest concentration of SARS-CoV-2 viral copies this assay can  detect is 138 copies/mL. A negative result does not preclude SARS-Cov-2 infection and should not be used as the sole basis for treatment or other patient management decisions. A negative result may occur with  improper specimen collection/handling, submission of specimen other than nasopharyngeal swab, presence of viral mutation(s) within the areas targeted by this assay, and inadequate number of viral copies(<138 copies/mL). A negative result must be combined with clinical observations, patient history, and epidemiological information. The expected result is Negative.  Fact Sheet for Patients:  EntrepreneurPulse.com.au  Fact Sheet for Healthcare Providers:  IncredibleEmployment.be  This test is no t yet approved or cleared by the [place] FDA and  has  been authorized for detection and/or diagnosis of SARS-CoV-2 by FDA under an Emergency Use Authorization (EUA). This EUA will remain  in effect (meaning this test can be used) for the duration of the COVID-19 declaration under Section 564(b)(1) of the Act, 21 U.S.C.section 360bbb-3(b)(1), unless the authorization is terminated  or revoked sooner.       Influenza A by PCR NEGATIVE NEGATIVE Final   Influenza B by PCR NEGATIVE NEGATIVE Final    Comment: (NOTE) The Xpert Xpress SARS-CoV-2/FLU/RSV plus assay is intended as an aid in the diagnosis of influenza from Nasopharyngeal swab specimens and should not be used as a sole basis for treatment. Nasal washings and aspirates are unacceptable for Xpert Xpress SARS-CoV-2/FLU/RSV testing.  Fact Sheet for Patients: EntrepreneurPulse.com.au  Fact Sheet for Healthcare Providers: IncredibleEmployment.be  This test is not yet approved or cleared by the [place] FDA and has been authorized for detection and/or diagnosis of SARS-CoV-2 by FDA under an Emergency Use Authorization (EUA). This EUA will remain in  effect (meaning this test can be used) for the duration of the COVID-19 declaration under Section 564(b)(1) of the Act, 21 U.S.C. section 360bbb-3(b)(1), unless the authorization is terminated or revoked.  Performed at [facility], [place] [address], [place], [place] [address]      Medications:    [START ON 09/18/2021] allopurinol  50 mg Oral QODAY   amLODipine  25 mg Oral Daily   atorvastatin  80 mg Oral Daily   cloNIDine  0.3 mg Oral TID   [START ON 09/18/2021] colchicine  0.6 mg Oral QODAY   enoxaparin (LOVENOX) injection  130 mg Subcutaneous Q24H   finasteride  5 mg Oral Daily   gabapentin  300 mg Oral QHS   hydrALAZINE  100 mg Oral TID   insulin aspart  0-9 Units Subcutaneous TID WC   levothyroxine  50 mcg Oral Q0600   metoprolol tartrate  50 mg Oral BID   naloxegol oxalate  25 mg Oral Daily   oxyCODONE  20 mg Oral Q12H   tamsulosin  0.4 mg Oral Daily   vancomycin variable dose per unstable renal function (pharmacist dosing)   Does not apply See admin instructions   vitamin B-12  200 mcg Oral Daily   Continuous Infusions:  sodium chloride 100 mL/hr at 09/17/21 0517   azithromycin Stopped (09/17/21 2800)   cefTRIAXone (ROCEPHIN)  IV        LOS: 0 days   Charlynne Cousins  [facility]  09/17/2021, 7:10 AM

## 2021-09-17 NOTE — ED Notes (Signed)
Patient states he is claustrophobic. Sent a message to Dr Olevia Bowens for medication.

## 2021-09-17 NOTE — Progress Notes (Signed)
ANTICOAGULATION CONSULT NOTE - Initial Consult  Pharmacy Consult for enoxaparin Indication: DVT  Allergies  Allergen Reactions   Other Other (See Comments)    Blood pressure issues Per [facility] hospital: Pt states he can only take these pain meds or else he gets very sick: Oxycontin,morphine,demerol, and dilaudid are the only pain meds pt states he can take.     Oxymorphone Other (See Comments)    Causes kidney problems   Aspirin Nausea And Vomiting    Per [facility]   Beta Vulgaris Nausea And Vomiting    Beets   Buspirone Other (See Comments)    Unknown reaction   Cabbage Nausea And Vomiting   Codeine Other (See Comments)    unknown   Dolobid [Diflunisal]     Unknown reaction   Fish Allergy Nausea And Vomiting   Fish-Derived Products Nausea And Vomiting   Methadone     Unknown reaction   Pentazocine Other (See Comments)    Unknown reaction   Propoxyphene Other (See Comments)    Unknown reaction   Shellfish Allergy Nausea And Vomiting   Sulfa Antibiotics Hives   Sulfasalazine Other (See Comments)    Unknown reaction   Vistaril [Hydroxyzine]     Unknown reaction   Amoxicillin Rash    Per [facility]    Patient Measurements: Height: 6\' 6"  (198.1 cm) Weight: 130.2 kg (287 lb) IBW/kg (Calculated) : 91.4  Vital Signs: Temp: 98.9 F (37.2 C) (11/28 2150) Temp Source: Oral (11/28 2150) BP: 135/81 (11/29 0000) Pulse Rate: 80 (11/29 0000)  Labs: Recent Labs    09/16/21 2245  HGB 8.7*  HCT 28.2*  PLT 228  CREATININE 4.62*    Estimated Creatinine Clearance: 23.1 mL/min (A) (by C-G formula based on SCr of 4.62 mg/dL (H)).   Medical History: Past Medical History:  Diagnosis Date   Arthritis    BPH (benign prostatic hyperplasia) 11/24/2019   Chronic pain syndrome 11/24/2019   CKD (chronic kidney disease), stage IV (HCC) 11/24/2019   Diabetes mellitus without complication (HCC)    Gout    Hypertension    Muscle pain    Physical deconditioning  11/24/2019   Scars    Swelling       Assessment: 70 yo pt here for evaluation of hypoxia, hyperglycemia and leg pain. Examination is concerning for cellulitis. Labs significant for acute kidney injury. Chest x-ray is concerning for pneumonia. He was treated with broad-spectrum antibiotics for cellulitis as well as pneumonia. His D dimer is elevated, will treat empirically for possible DVT/PE pending further workup, pharmacy to dose enoxaparin, no prior AC noted  Hgb 8.7, plts 228, Scr 4.62  Goal of Therapy:  Anti-Xa level 0.6-1 units/ml 4hrs after LMWH dose given Monitor platelets by anticoagulation protocol: Yes   Plan:  Enoxaparin 1mg /kg (130mg ) SQ q24h for CrCl < 30 mls/min Follow renal function  Dolly Rias RPh 09/17/2021, 2:48 AM

## 2021-09-17 NOTE — ED Notes (Signed)
Patient used  bedpan for bowel movement.

## 2021-09-17 NOTE — Progress Notes (Signed)
Pharmacy Antibiotic Note  Thomas [name] is a 70 y.o. male admitted on 09/16/2021 with leg pain.  Pharmacy has been consulted to dose vancomycin for cellulitis.  Scr 4.62  Plan: Vancomycin 2gm IV x1 Follow renal function Consider random vancomycin in 48 hours   Height: 6\' 6"  (198.1 cm) Weight: 130.2 kg (287 lb) IBW/kg (Calculated) : 91.4  Temp (24hrs), Avg:98.9 F (37.2 C), Min:98.9 F (37.2 C), Max:98.9 F (37.2 C)  Recent Labs  Lab 09/16/21 2245  WBC 7.0  CREATININE 4.62*  LATICACIDVEN 0.8    Estimated Creatinine Clearance: 23.1 mL/min (A) (by C-G formula based on SCr of 4.62 mg/dL (H)).    Allergies  Allergen Reactions   Other Other (See Comments)    Blood pressure issues Per [facility] hospital: Pt states he can only take these pain meds or else he gets very sick: Oxycontin,morphine,demerol, and dilaudid are the only pain meds pt states he can take.     Oxymorphone Other (See Comments)    Causes kidney problems   Aspirin Nausea And Vomiting    Per [facility]   Beta Vulgaris Nausea And Vomiting    Beets   Buspirone Other (See Comments)    Unknown reaction   Cabbage Nausea And Vomiting   Codeine Other (See Comments)    unknown   Dolobid [Diflunisal]     Unknown reaction   Fish Allergy Nausea And Vomiting   Fish-Derived Products Nausea And Vomiting   Methadone     Unknown reaction   Pentazocine Other (See Comments)    Unknown reaction   Propoxyphene Other (See Comments)    Unknown reaction   Shellfish Allergy Nausea And Vomiting   Sulfa Antibiotics Hives   Sulfasalazine Other (See Comments)    Unknown reaction   Vistaril [Hydroxyzine]     Unknown reaction   Amoxicillin Rash    Per [facility]    Antimicrobials this admission: Vanc 11/29 >> CTX 11/29 >>  Dose adjustments this admission:   Microbiology results: 11/28 BCx:   Thank you for allowing pharmacy to be a part of this patient's care.  Dolly Rias RPh 09/17/2021, 3:12  AM

## 2021-09-17 NOTE — ED Notes (Signed)
Pt drank orange juice - pt alert and oriented

## 2021-09-17 NOTE — Progress Notes (Signed)
PHARMACY NOTE: RENAL DOSAGE ADJUSTMENT  Current antimicrobial regimen includes a mismatch between antimicrobial dosage and estimated renal function.  As per policy approved by the Pharmacy & Therapeutics and Medical Executive Committees, the antimicrobial dosage will be adjusted accordingly. Renal Function:  Estimated Creatinine Clearance: 23.1 mL/min (A) (by C-G formula based on SCr of 4.62 mg/dL (H)).   adjusted allopurinol (100qd>50 qod)   colchicine (0.6 qd> 0.6 qod)  Naloxegol ([place]) 25 qd> 12.5 qday    Thank you for allowing pharmacy to be a part of this patient's care.  Eudelia Bunch, Pharm.D 09/17/2021 10:56 AM

## 2021-09-17 NOTE — Progress Notes (Signed)
LLE venous duplex has been completed.  Preliminary findings given to Levada Dy, Therapist, sports.   Results can be found under chart review under CV PROC. 09/17/2021 11:22 AM Braian Tijerina RVT, RDMS

## 2021-09-17 NOTE — ED Notes (Signed)
Pt to MRI

## 2021-09-17 NOTE — ED Notes (Signed)
Pt drank 8 ounces of orange juice. Pt alert and oriented

## 2021-09-17 NOTE — ED Notes (Addendum)
Pt. Was given a applesauce due to CBG being 59. Nurses aware.

## 2021-09-17 NOTE — ED Notes (Signed)
Pt had 12 ounces of orange juice, grits, eggs

## 2021-09-18 DIAGNOSIS — N179 Acute kidney failure, unspecified: Secondary | ICD-10-CM | POA: Diagnosis not present

## 2021-09-18 LAB — CBC
HCT: 26.3 % — ABNORMAL LOW (ref 39.0–52.0)
Hemoglobin: 8.2 g/dL — ABNORMAL LOW (ref 13.0–17.0)
MCH: 31.3 pg (ref 26.0–34.0)
MCHC: 31.2 g/dL (ref 30.0–36.0)
MCV: 100.4 fL — ABNORMAL HIGH (ref 80.0–100.0)
Platelets: 213 10*3/uL (ref 150–400)
RBC: 2.62 MIL/uL — ABNORMAL LOW (ref 4.22–5.81)
RDW: 14.7 % (ref 11.5–15.5)
WBC: 5.2 10*3/uL (ref 4.0–10.5)
nRBC: 0 % (ref 0.0–0.2)

## 2021-09-18 LAB — BASIC METABOLIC PANEL
Anion gap: 6 (ref 5–15)
BUN: 58 mg/dL — ABNORMAL HIGH (ref 8–23)
CO2: 21 mmol/L — ABNORMAL LOW (ref 22–32)
Calcium: 7.1 mg/dL — ABNORMAL LOW (ref 8.9–10.3)
Chloride: 113 mmol/L — ABNORMAL HIGH (ref 98–111)
Creatinine, Ser: 3.51 mg/dL — ABNORMAL HIGH (ref 0.61–1.24)
GFR, Estimated: 18 mL/min — ABNORMAL LOW (ref >60)
Glucose, Bld: 106 mg/dL — ABNORMAL HIGH (ref 70–99)
Potassium: 4.7 mmol/L (ref 3.5–5.1)
Sodium: 140 mmol/L (ref 135–145)

## 2021-09-18 LAB — GLUCOSE, CAPILLARY
Glucose-Capillary: 108 mg/dL — ABNORMAL HIGH (ref 70–99)
Glucose-Capillary: 110 mg/dL — ABNORMAL HIGH (ref 70–99)
Glucose-Capillary: 115 mg/dL — ABNORMAL HIGH (ref 70–99)
Glucose-Capillary: 131 mg/dL — ABNORMAL HIGH (ref 70–99)
Glucose-Capillary: 133 mg/dL — ABNORMAL HIGH (ref 70–99)
Glucose-Capillary: 150 mg/dL — ABNORMAL HIGH (ref 70–99)

## 2021-09-18 LAB — PROCALCITONIN: Procalcitonin: 0.12 ng/mL

## 2021-09-18 MED ORDER — CLONIDINE HCL 0.1 MG PO TABS
0.1000 mg | ORAL_TABLET | Freq: Every day | ORAL | Status: DC
  Administered 2021-09-18 – 2021-09-19 (×2): 0.1 mg via ORAL
  Filled 2021-09-18: qty 1

## 2021-09-18 MED ORDER — CLONIDINE HCL 0.1 MG PO TABS: 0.1000 mg | ORAL_TABLET | Freq: Three times a day (TID) | ORAL | Status: DC

## 2021-09-18 MED ORDER — SODIUM CHLORIDE 0.9 % IV SOLN: INTRAVENOUS | Status: AC

## 2021-09-18 NOTE — Evaluation (Signed)
Occupational Therapy Evaluation Patient Details Name: Thomas [name] MRN: [id number] DOB: September 14, 1951 Today's Date: 09/18/2021   History of Present Illness Patient is a 70 year old male who presented to hospital after brother called EMS. patient was found to have AKI, CAP,and LLE edema.  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   Clinical Impression   Patient is a 70 year old male who was noted to have had a functional decline with admission for above. Patients evaluation was limited by patients decline to participate in sitting edge of bed or transfers. Patient was able to demonstrate bed mobility with SUP and BUE ROM. Patient lives at home alone and will need to be able to complete functional mobility and ADLs MI at RW to transition home safely. Patient would continue to benefit from skilled OT services at this time while admitted and after d/c to address noted deficits in order to improve overall safety and independence in ADLs.       Recommendations for follow up therapy are one component of a multi-disciplinary discharge planning process, led by the attending physician.  Recommendations may be updated based on patient status, additional functional criteria and insurance authorization.   Follow Up Recommendations  Skilled nursing-short term rehab (<3 hours/day) (pending progress in therapy)    Assistance Recommended at Discharge Frequent or constant Supervision/Assistance  Functional Status Assessment  Patient has had a recent decline in their functional status and demonstrates the ability to make significant improvements in function in a reasonable and predictable amount of time.  Equipment Recommendations  None recommended by OT    Recommendations for Other Services       Precautions / Restrictions Precautions Precautions: Fall Precaution Comments: R BKA Restrictions Weight Bearing Restrictions:  No      Mobility Bed Mobility Overal bed mobility: Needs Assistance Bed Mobility: Rolling Rolling: Supervision         General bed mobility comments: patient demonstrated to rolling to each side in bed with use of bed rails. patient declined to sit up on edge of bed stating he was too fatigued.    Transfers                   General transfer comment: patient declined to participate      Balance                                           ADL either performed or assessed with clinical judgement   ADL Overall ADL's : Needs assistance/impaired Eating/Feeding: Set up;Sitting Eating/Feeding Details (indicate cue type and reason): patient declined to eat meal infront of him. patient was assisted in process to call kitchen to order meal with min A Grooming: Wash/dry face;Bed level;Set up   Upper Body Bathing: Minimal assistance;Bed level Upper Body Bathing Details (indicate cue type and reason): patient declined to participate in any bathing or dressing tasks. Lower Body Bathing: Bed level;Moderate assistance   Upper Body Dressing : Bed level;Set up   Lower Body Dressing: Bed level;Moderate assistance     Toilet Transfer Details (indicate cue type and reason): declined to trial           General ADL Comments: patient declined to participate in sitting up on edge of bed. patient was able to participate in rolling to each side in bed  with increased time.     Vision Baseline Vision/History: 1 Wears glasses Patient Visual Report: No change from baseline       Perception     Praxis      Pertinent Vitals/Pain Pain Assessment: No/denies pain     Hand Dominance Right   Extremity/Trunk Assessment Upper Extremity Assessment Upper Extremity Assessment: Overall WFL for tasks assessed   Lower Extremity Assessment Lower Extremity Assessment: Defer to PT evaluation   Cervical / Trunk Assessment Cervical / Trunk Assessment: Normal    Communication Communication Communication: No difficulties   Cognition Arousal/Alertness: Lethargic Behavior During Therapy: Flat affect Overall Cognitive Status: No family/caregiver present to determine baseline cognitive functioning                                       General Comments       Exercises     Shoulder Instructions      Home Living Family/patient expects to be discharged to:: Private residence Living Arrangements: Alone Available Help at Discharge: Available PRN/intermittently Type of Home: Apartment Home Access: Stairs to enter [employer] of Steps: 1 Entrance Stairs-Rails: None Home Layout: One level     Bathroom Shower/Tub: Tub/shower unit         Home Equipment: Cane - single Environmental consultant (2 wheels)   Additional Comments: patient was d/c to rehab at time of d/c from this SNF.      Prior Functioning/Environment                          OT Problem List: Decreased activity tolerance;Impaired balance (sitting and/or standing);Decreased safety awareness;Decreased knowledge of use of DME or AE;Decreased knowledge of precautions;Obesity      OT Treatment/Interventions: Self-care/ADL training;Therapeutic exercise;Neuromuscular education;Energy conservation;DME and/or AE instruction;Therapeutic activities;Balance training;Patient/family education    OT Goals(Current goals can be found in the care plan section) Acute Rehab OT Goals Patient Stated Goal: "not be in the hospital" OT Goal Formulation: With patient Time For Goal Achievement: 10/02/21 Potential to Achieve Goals: Fair  OT Frequency: Min 2X/week   Barriers to D/C:    patient lives at home alone       Co-evaluation              AM-PAC OT "6 Clicks" Daily Activity     Outcome Measure Help from another person eating meals?: A Little Help from another person taking care of personal grooming?: A Little Help from another person  toileting, which includes using toliet, bedpan, or urinal?: A Lot Help from another person bathing (including washing, rinsing, drying)?: A Lot Help from another person to put on and taking off regular upper body clothing?: A Little Help from another person to put on and taking off regular lower body clothing?: A Lot 6 Click Score: 15   End of Session    Activity Tolerance: Patient limited by lethargy;Patient limited by fatigue Patient left: in bed;with call bell/phone within reach;with bed alarm set  OT Visit Diagnosis: Unsteadiness on feet (R26.81);Muscle weakness (generalized) (M62.81)                Time: 1950-9326 OT Time Calculation (min): 13 min Charges:  OT General Charges $OT Visit: 1 Visit OT Evaluation $OT Eval Low Complexity: 1 Low  Jackelyn Poling OTR/L, MS Acute Rehabilitation Department Office# [phone] Pager# [phone]   Marcellina Millin 09/18/2021, 4:04 PM

## 2021-09-18 NOTE — Consult Note (Signed)
[facility] Nurse wound consult note Consultation was completed by review of records, images and assistance from the bedside nurse/clinical staff.  reason for Consult: right BKA site abrasion/cellulitis LLE Wound type: Venous dermatitis with weeping LLE Abrasion right lower leg  Pressure Injury POA: NA Measurement:crusted areas over the LLE Wound bed: cracking with weeping  Drainage (amount, consistency, odor) none reported  Periwound: intact  Dressing procedure/placement/frequency:  Xeroform to the LLE, kerlix and ACE wrap every other day Silicone foam to the abrasion RLE.    Re consult if needed, will not follow at this time. Thanks  Michaiah Holsopple [employer], RN,CWOCN, CNS, [place] [phone])

## 2021-09-18 NOTE — Progress Notes (Addendum)
PT Cancellation Note  Patient Details Name: TRENTYN BOISCLAIR MRN: [id number] DOB: 08-30-1951   Cancelled Treatment:    Reason Eval/Treat Not Completed: Patient declined, no reason specified, wanted to eat, then pushed tray aside. Will check back another time.  1520:patient just got another tray and wants to eat. Will check back tomorrow.   Claretha Cooper 09/18/2021, 2:43 PM [place] Pager [phone] Office [phone]

## 2021-09-18 NOTE — Progress Notes (Signed)
[facility] PROGRESS NOTE    Progress Note  OUSMAN DISE  ERX:[id number] DOB: 1950/10/31 DOA: 09/16/2021 PCP: Antonietta Jewel, MD     Brief Narrative:   Thomas [name] is an 70 y.o. male past medical history of diabetes mellitus type 2, essential hypertension, chronic kidney disease stage IV, chronic normocytic anemia, brought into the ED after being found by his brother not looking good.  Initially his blood glucose was 57 was given D10 renal function of 4.6 (about 3 months ago 2.4) CT of the kidneys showed right hydronephrosis without obstruction atrophic right kidney left basilar consolidation, chest x-ray showed left basilar consolidation, patient has not been eating for the last 3 days.  Work-up revealed AKI on CKD 4, suspected prerenal secondary to dehydration.  Also revealed left lower lobe community-acquired pneumonia, left lower extremity cellulitis and age-indeterminate left lower extremity DVT.  09/18/2021: Patient was seen and examined at bedside.  Reports pain in his left leg and entire spine.  Assessment/Plan:   Acute kidney injury on chronic kidney disease stage IV: Likely prerenal secondary to dehydration, UA pyuria and proteinuria. Baseline creatinine appears to be 2.5 with GFR 25 Presented with creatinine of 4.62 Creatinine downtrending with IV fluid, 3.51. Continue normal saline at 50 cc/h x 2 days. Avoid nephrotoxic agents, dehydration and hypotension Closely monitor urine output with strict I's and O's. Repeat renal panel in the morning.  Community-acquired pneumonia/left lower lobe pneumonia, POA: Has remained afebrile vitals are stable requiring 2 L of oxygen to keep saturations greater than 95%. No leukocytosis.   Was started empirically on Rocephin and azithromycin, procalcitonin is 0.12. Continue IV antibiotics  Left lower extremity swelling concerning for cellulitis. Dopplers positive for age-indeterminate DVT left lower extremity. MRI of the left  foot, osteomyelitis could not be completely ruled out.  On IV vancomycin Orthopedic surgery consult  Left lower extremity age-indeterminate DVT, seen on Doppler ultrasound Currently on full dose subcu Lovenox Will switch to Eliquis if no plan for surgical procedure  Low back pain with prior history of thoracic spine discitis:  MRI of the T-spine no oste or diskitis.  Diabetes mellitus type 2 with hyperglycemia: Hold oral hypoglycemic agents and insulin started on D10 on admission.  Essential hypertension: Soft BPs Hold off home oral antihypertensives except for home clonidine. Reduced dose and frequency of home clonidine to 0.1 mg daily, to avoid hypotension. Continue to monitor vital signs  Anemia of chronic renal disease: With macrocytosis. Anemia panel has been checked.  Chronic pain: Continue on MS Contin at a lower dose due to his renal failure.  Consult: Orthopedic surgery      ARF (acute renal failure) ([place]) Active Problems:   [name], right ([place])   [name], type 2) ([place])   HTN (hypertension)   BPH (benign prostatic hyperplasia)   Chronic pain syndrome   CAP (community acquired pneumonia)  RN Pressure Injury Documentation: Pressure Injury 06/03/21 Hip Anterior;Left Stage 1 -  Intact skin with non-blanchable redness of a localized area usually over a bony prominence. skin intact and has dark red bruising (Active)  06/03/21 0238  Location: Hip  Location Orientation: Anterior;Left  Staging: Stage 1 -  Intact skin with non-blanchable redness of a localized area usually over a bony prominence.  Wound Description (Comments): skin intact and has dark red bruising  Present on Admission: Yes     Pressure Injury 06/03/21 Penis Mid;Circumferential Stage 2 -  Partial thickness loss of dermis presenting as  a shallow open injury with a red, pink wound bed without slough. swollen, red, and weeping with slight tear on top (Active)  06/03/21 0240   Location: Penis  Location Orientation: Mid;Circumferential  Staging: Stage 2 -  Partial thickness loss of dermis presenting as a shallow open injury with a red, pink wound bed without slough.  Wound Description (Comments): swollen, red, and weeping with slight tear on top  Present on Admission: Yes    Estimated body mass index is 33.63 kg/m as calculated from the following:   Height as of this encounter: 6\' 6"  (1.981 m).   Weight as of this encounter: 132 kg.    DVT prophylaxis: Full dose subcu Lovenox twice daily Family Communication:none Status is: Inpatient.  Patient requires at least 2 midnights for further evaluation and treatment of present condition.   Code Status:     Code Status Orders  (From admission, onward)           Start     Ordered   09/17/21 0347  Full code  Continuous        09/17/21 0349           Code Status History     Date Active Date Inactive Code Status Order ID Comments User Context   06/02/2021 2155 06/07/2021 0017 Full Code [id number]  Thomas Girt, DO ED   08/30/2020 0853 10/03/2020 2226 Full Code [id number]  Thomas Anis, MD ED   11/24/2019 0932 12/17/2019 1907 Full Code [id number]  Thomas Bongo, MD Inpatient   08/31/2019 0300 10/09/2019 1947 Full Code [id number]  Thomas Patience, MD Inpatient         IV Access:   Peripheral IV   Procedures and diagnostic studies:   MR THORACIC SPINE WO CONTRAST  Result Date: 09/17/2021 CLINICAL DATA:  Back pain. History of thoracolumbar fusion. 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 EXAM: MRI THORACIC SPINE WITHOUT CONTRAST TECHNIQUE: Multiplanar, multisequence MR imaging of the thoracic spine was performed. No intravenous contrast was administered. COMPARISON:  CT scan 09/17/2021 and prior thoracic spine MRI 08/30/2020 FINDINGS: Examination is limited by artifact from the thoracolumbar hardware and patient motion. Alignment: Normal overall alignment. Vertebrae: Normal  marrow signal. No bone lesions or fractures. Chronic changes at T10-11 but no findings suspicious for active discitis or osteomyelitis. Cord: Grossly normal cord signal intensity. No obvious cord lesions. Paraspinal and other soft tissues: No significant paraspinal findings. No paraspinal abscess. Disc levels: Postoperative changes with posterior fusion hardware from T10-T12. No obvious complicating features are identified. No obvious spinal or foraminal stenosis in this area. Shallow left paracentral disc protrusion at T8-9 with mild flattening of the thecal sac. No foraminal stenosis. No other thoracic disc protrusions, spinal or foraminal stenosis identified. IMPRESSION: 1. Postoperative changes with posterior fusion hardware from T10-T12. No obvious complicating features are identified. 2. Chronic changes at T10-11 without findings suspicious for recurrent/active discitis/osteomyelitis. 3. Shallow left paracentral disc protrusion at T8-9 with mild flattening of the thecal sac. 4. No other thoracic disc protrusions, spinal or foraminal stenosis. Electronically Signed   By: Marijo Sanes M.D.   On: 09/17/2021 09:33   MR FOOT LEFT WO CONTRAST  Result Date: 09/17/2021 CLINICAL DATA:  Osteomyelitis, foot EXAM: MRI OF THE LEFT FOOT WITHOUT CONTRAST TECHNIQUE: Multiplanar, multisequence MR imaging of the left foot was performed. No intravenous contrast was administered. COMPARISON:  MRI 04/06/2020, radiograph 04/05/2020 FINDINGS: Bones/Joint/Cartilage Unchanged severe arthritis with chronic erosive changes of the distal first and second  metatarsals and adjacent proximal phalanges. Unchanged severe arthritis and erosive changes of the third through fifth digits. Unchanged posttraumatic or severe arthritic deformity of the distal fifth metatarsal. There is preserved T1 signal throughout the foot. There is mild paratracheal bony edema within the second digit phalanges. Unchanged severe arthritis of the  tarsometatarsal joints. There is periarticular bony edema throughout the midfoot likely related to arthritis. Ligaments Multiple disrupted MCP and interphalangeal joint ligaments. Muscles and Tendons Diffuse atrophy in the foot and swelling as is commonly seen in diabetics. Soft tissues Diffuse soft tissue swelling. No focal fluid collection on noncontrast exam. IMPRESSION: Mild periarticular bony edema within the second digit phalanges with preserved T1 marrow signal. This is favored to be related to severe arthritis/reactive marrow change, though if there is an adjacent soft tissue ulcer, early osteomyelitis would be possible. No other evidence of osteomyelitis in the foot. Diffuse soft tissue swelling. No evidence of soft tissue abscess on noncontrast exam. 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 Electronically Signed   By: Maurine Simmering M.D.   On: 09/17/2021 09:12   DG Chest Port 1 View  Result Date: 09/17/2021 CLINICAL DATA:  Shortness of breath EXAM: PORTABLE CHEST 1 VIEW COMPARISON:  08/24/2021 FINDINGS: Left basilar consolidation is new since the prior study. Mild cardiomegaly. Shallow lung inflation. IMPRESSION: Left basilar consolidation, likely pneumonia. Electronically Signed   By: Ulyses Jarred M.D.   On: 09/17/2021 00:01   CT Renal Stone Study  Result Date: 09/17/2021 CLINICAL DATA:  Acute kidney injury EXAM: CT ABDOMEN AND PELVIS WITHOUT CONTRAST TECHNIQUE: Multidetector CT imaging of the abdomen and pelvis was performed following the standard protocol without IV contrast. COMPARISON:  08/24/2021 FINDINGS: LOWER CHEST: Left basilar consolidation. HEPATOBILIARY: Normal hepatic contours. No intra- or extrahepatic biliary dilatation. The gallbladder is normal. PANCREAS: Normal pancreas. No ductal dilatation or peripancreatic fluid collection. SPLEEN: Normal. ADRENALS/URINARY TRACT: The adrenal glands are normal. The right kidney is atrophic. There is  moderate right hydroureteronephrosis. The urinary bladder is normal for degree of distention STOMACH/BOWEL: There is no hiatal hernia. Normal duodenal course and caliber. No small bowel dilatation or inflammation. No focal colonic abnormality. Normal appendix. VASCULAR/LYMPHATIC: There is calcific aortic atherosclerosis. There is an infrarenal IVC filter. REPRODUCTIVE: Normal prostate size with symmetric seminal vesicles. MUSCULOSKELETAL. No bony spinal canal stenosis or focal osseous abnormality. OTHER: None. IMPRESSION: 1. Moderate right hydroureteronephrosis without obstructing stone or mass. 2. Atrophic right kidney. 3. Left basilar consolidation, concerning for pneumonia. Aortic Atherosclerosis (ICD10-I70.0). Electronically Signed   By: Ulyses Jarred M.D.   On: 09/17/2021 01:39   VAS [place] LOWER EXTREMITY VENOUS (DVT)  Result Date: 09/17/2021  Lower Venous DVT Study Patient Name:  LORENE SAMAAN  Date of Exam:   09/17/2021 Medical Rec #: [id number]        Accession #:    [id number] Date of Birth: [birth year]        Patient Gender: M Patient Age:   29 years Exam Location:  [facility] Procedure:      VAS [place] LOWER EXTREMITY VENOUS (DVT) Referring Phys: Gean Birchwood --------------------------------------------------------------------------------  Indications: Ulceration, Pain, Edema, and Erythema.  Limitations: Body habitus and poor ultrasound/tissue interface. Comparison Study: No previous exams Performing Technologist: Jody Hill RVT, RDMS  Examination Guidelines: A complete evaluation includes B-mode imaging, spectral Doppler, color Doppler, and power Doppler as needed of all accessible portions of each vessel. Bilateral testing is considered an integral part of a complete examination. Limited  examinations for reoccurring indications may be performed as noted. The reflux portion of the exam is performed with the patient in reverse Trendelenburg.   +-----+---------------+---------+-----------+----------+--------------+ RIGHTCompressibilityPhasicitySpontaneityPropertiesThrombus Aging +-----+---------------+---------+-----------+----------+--------------+ CFV  Full           Yes      Yes                                 +-----+---------------+---------+-----------+----------+--------------+   +---------+---------------+---------+-----------+----------+-------------------+ LEFT     CompressibilityPhasicitySpontaneityPropertiesThrombus Aging      +---------+---------------+---------+-----------+----------+-------------------+ CFV      Full           Yes      Yes                                      +---------+---------------+---------+-----------+----------+-------------------+ SFJ      Full                                                             +---------+---------------+---------+-----------+----------+-------------------+ FV Prox  Full           Yes      Yes                                      +---------+---------------+---------+-----------+----------+-------------------+ FV Mid   Partial        No       No                   Age Indeterminate   +---------+---------------+---------+-----------+----------+-------------------+ FV DistalPartial        Yes      Yes                  Age Indeterminate   +---------+---------------+---------+-----------+----------+-------------------+ PFV      Full                                                             +---------+---------------+---------+-----------+----------+-------------------+ POP      Full           Yes      Yes                                      +---------+---------------+---------+-----------+----------+-------------------+ PTV                     Yes      Yes                  Not well visualized +---------+---------------+---------+-----------+----------+-------------------+ PERO                    Yes      Yes                   Not well visualized +---------+---------------+---------+-----------+----------+-------------------+ Only one of paired  veins seen in both posterior tibial & peroneal veins. Patent by color and doppler.    Summary: RIGHT: - No evidence of common femoral vein obstruction.  LEFT: - Findings consistent with age indeterminate deep vein thrombosis involving the left femoral vein. - There is no evidence of superficial venous thrombosis.  - No cystic structure found in the popliteal fossa. Subcutaneous edema noted throughout.  *See table(s) above for measurements and observations. Electronically signed by Harold Barban MD on 09/17/2021 at 7:01:35 PM.    Final      Medical Consultants:   Orthopedic surgery.     Objective:    Vitals:   09/17/21 2109 09/18/21 0003 09/18/21 0453 09/18/21 1020  BP: (!) 162/100 (!) 149/75 119/65 138/68  Pulse: (!) 107 80 71 72  Resp: 20 20 18 18   Temp: 98 F (36.7 C) 99.1 F (37.3 C) 98.6 F (37 C) 98.4 F (36.9 C)  TempSrc: Oral Oral Oral Oral  SpO2: 97% 95% 96% 97%  Weight:   132 kg   Height:       SpO2: 97 % O2 Flow Rate (L/min): 1.5 L/min   Intake/Output Summary (Last 24 hours) at 09/18/2021 1248 Last data filed at 09/18/2021 0323 Gross per 24 hour  Intake 1986.78 ml  Output 1100 ml  Net 886.78 ml   Filed Weights   09/16/21 2200 09/18/21 0453  Weight: 130.2 kg 132 kg    Exam: General exam: Well-developed well-nourished in no acute distress.  He is alert oriented x3. Respiratory system: Clear auscultation with no wheezes or rales.  Good inspiratory effort. Cardiovascular system: Regular rate and rhythm no rubs or gallops.  No JVD noted.   Gastrointestinal system: Abdomen is obese soft nontender normal bowel sounds present.   Extremities: Right below the knee amputation.   Skin: Left foot ulcer at the tip of the second toe with swelling.  Erythema with chronic venous stasis left lower extremity.   Psychiatry: Mood is appropriate  for condition and setting. Neuro: Nonfocal.  Data Reviewed:    Labs: Basic Metabolic Panel: Recent Labs  Lab 09/16/21 2245 09/18/21 0934  NA 135 140  K 4.3 4.7  CL 104 113*  CO2 20* 21*  GLUCOSE 86 106*  BUN 73* 58*  CREATININE 4.62* 3.51*  CALCIUM 7.3* 7.1*   GFR Estimated Creatinine Clearance: 30.7 mL/min (A) (by C-G formula based on SCr of 3.51 mg/dL (H)). Liver Function Tests: Recent Labs  Lab 09/16/21 2245  AST 26  ALT 11  ALKPHOS 82  BILITOT 0.5  PROT 8.2*  ALBUMIN 3.1*   No results for input(s): LIPASE, AMYLASE in the last 168 hours. No results for input(s): AMMONIA in the last 168 hours. Coagulation profile No results for input(s): INR, PROTIME in the last 168 hours. COVID-19 Labs  Recent Labs    09/16/21 2245 09/17/21 0643  DDIMER 4.41*  --   FERRITIN  --  124    Lab Results  Component Value Date   SARSCOV2NAA NEGATIVE 09/16/2021   SARSCOV2NAA NEGATIVE 06/06/2021   SARSCOV2NAA NEGATIVE 06/02/2021   [place] NEGATIVE 10/05/2020    CBC: Recent Labs  Lab 09/16/21 2245 09/18/21 0934  WBC 7.0 5.2  NEUTROABS 5.7  --   HGB 8.7* 8.2*  HCT 28.2* 26.3*  MCV 101.8* 100.4*  PLT 228 213   Cardiac Enzymes: No results for input(s): CKTOTAL, CKMB, CKMBINDEX, TROPONINI in the last 168 hours. BNP (last 3 results) No results for input(s): PROBNP in the last 8760 hours. CBG:  Recent Labs  Lab 09/17/21 2111 09/18/21 0005 09/18/21 0454 09/18/21 0735 09/18/21 1144  GLUCAP 131* 133* 131* 110* 108*   D-Dimer: Recent Labs    09/16/21 2245  DDIMER 4.41*   Hgb A1c: No results for input(s): HGBA1C in the last 72 hours. Lipid Profile: No results for input(s): CHOL, HDL, LDLCALC, TRIG, CHOLHDL, LDLDIRECT in the last 72 hours. Thyroid function studies: No results for input(s): TSH, T4TOTAL, T3FREE, THYROIDAB in the last 72 hours.  Invalid input(s): FREET3 Anemia work up: Recent Labs    09/17/21 0643  VITAMINB12 796  FOLATE 18.0   FERRITIN 124  TIBC 193*  IRON 18*  RETICCTPCT 1.6   Sepsis Labs: Recent Labs  Lab 09/16/21 2245 09/18/21 0934  PROCALCITON  --  0.12  WBC 7.0 5.2  LATICACIDVEN 0.8  --    Microbiology Recent Results (from the past 240 hour(s))  Blood culture (routine x 2)     Status: None (Preliminary result)   Collection Time: 09/16/21 10:45 PM   Specimen: BLOOD  Result Value Ref Range Status   Specimen Description   Final    BLOOD BLOOD RIGHT ARM Performed at [facility], [place] [address] [place], [place] [address]    Special Requests   Final    BOTTLES DRAWN AEROBIC AND ANAEROBIC Blood Culture adequate volume Performed at [facility] [address] [place],  [address]    Culture   Final    NO GROWTH 1 DAY Performed at [facility], [place] [address] [place],  [address]    Report Status PENDING  Incomplete  Resp Panel by RT-PCR (Flu A&B, Covid)     Status: None   Collection Time: 09/16/21 10:45 PM   Specimen: Nasopharyngeal(NP) swabs in vial transport medium  Result Value Ref Range Status   SARS Coronavirus 2 by RT PCR NEGATIVE NEGATIVE Final    Comment: (NOTE) SARS-CoV-2 target nucleic acids are NOT DETECTED.  The SARS-CoV-2 RNA is generally detectable in upper respiratory specimens during the acute phase of infection. The lowest concentration of SARS-CoV-2 viral copies this assay can detect is 138 copies/mL. A negative result does not preclude SARS-Cov-2 infection and should not be used as the sole basis for treatment or other patient management decisions. A negative result may occur with  improper specimen collection/handling, submission of specimen other than nasopharyngeal swab, presence of viral mutation(s) within the areas targeted by this assay, and inadequate number of viral copies(<138 copies/mL). A negative result must be combined with clinical observations, patient history, and epidemiological information.  The expected result is Negative.  Fact Sheet for Patients:  EntrepreneurPulse.com.au  Fact Sheet for Healthcare Providers:  IncredibleEmployment.be  This test is no t yet approved or cleared by the [place] FDA and  has been authorized for detection and/or diagnosis of SARS-CoV-2 by FDA under an Emergency Use Authorization (EUA). This EUA will remain  in effect (meaning this test can be used) for the duration of the COVID-19 declaration under Section 564(b)(1) of the Act, 21 U.S.C.section 360bbb-3(b)(1), unless the authorization is terminated  or revoked sooner.       Influenza A by PCR NEGATIVE NEGATIVE Final   Influenza B by PCR NEGATIVE NEGATIVE Final    Comment: (NOTE) The Xpert Xpress SARS-CoV-2/FLU/RSV plus assay is intended as an aid in the diagnosis of influenza from Nasopharyngeal swab specimens and should not be used as a sole basis for treatment. Nasal washings and aspirates are unacceptable for Xpert  Xpress SARS-CoV-2/FLU/RSV testing.  Fact Sheet for Patients: EntrepreneurPulse.com.au  Fact Sheet for Healthcare Providers: IncredibleEmployment.be  This test is not yet approved or cleared by the [place] FDA and has been authorized for detection and/or diagnosis of SARS-CoV-2 by FDA under an Emergency Use Authorization (EUA). This EUA will remain in effect (meaning this test can be used) for the duration of the COVID-19 declaration under Section 564(b)(1) of the Act, 21 U.S.C. section 360bbb-3(b)(1), unless the authorization is terminated or revoked.  Performed at [facility], [place] [address] [place], [place] [address]      Medications:    allopurinol  50 mg Oral QODAY   atorvastatin  80 mg Oral Daily   cloNIDine  0.1 mg Oral Daily   colchicine  0.6 mg Oral QODAY   enoxaparin (LOVENOX) injection  130 mg Subcutaneous Q24H   finasteride  5 mg Oral Daily    gabapentin  300 mg Oral QHS   insulin aspart  0-9 Units Subcutaneous TID WC   levothyroxine  50 mcg Oral Q0600   mouth rinse  15 mL Mouth Rinse BID   naloxegol oxalate  12.5 mg Oral Daily   oxyCODONE  20 mg Oral Q12H   tamsulosin  0.4 mg Oral Daily   vancomycin variable dose per unstable renal function (pharmacist dosing)   Does not apply See admin instructions   vitamin B-12  200 mcg Oral Daily   Continuous Infusions:  sodium chloride 50 mL/hr at 09/18/21 0730   azithromycin 250 mL/hr at 09/18/21 0323   cefTRIAXone (ROCEPHIN)  IV Stopped (09/17/21 2344)      LOS: 1 day   Kayleen Memos  [facility]  09/18/2021, 12:48 PM

## 2021-09-19 DIAGNOSIS — N179 Acute kidney failure, unspecified: Secondary | ICD-10-CM | POA: Diagnosis not present

## 2021-09-19 LAB — CREATININE, SERUM
Creatinine, Ser: 2.86 mg/dL — ABNORMAL HIGH (ref 0.61–1.24)
GFR, Estimated: 23 mL/min — ABNORMAL LOW (ref >60)

## 2021-09-19 LAB — GLUCOSE, CAPILLARY
Glucose-Capillary: 118 mg/dL — ABNORMAL HIGH (ref 70–99)
Glucose-Capillary: 121 mg/dL — ABNORMAL HIGH (ref 70–99)
Glucose-Capillary: 134 mg/dL — ABNORMAL HIGH (ref 70–99)
Glucose-Capillary: 136 mg/dL — ABNORMAL HIGH (ref 70–99)
Glucose-Capillary: 137 mg/dL — ABNORMAL HIGH (ref 70–99)
Glucose-Capillary: 147 mg/dL — ABNORMAL HIGH (ref 70–99)

## 2021-09-19 LAB — URINE CULTURE: Culture: NO GROWTH

## 2021-09-19 MED ORDER — FERROUS SULFATE 325 (65 FE) MG PO TABS
325.0000 mg | ORAL_TABLET | Freq: Every day | ORAL | Status: DC
  Administered 2021-09-20: 325 mg via ORAL
  Filled 2021-09-19 (×3): qty 1

## 2021-09-19 MED ORDER — BISACODYL 10 MG RE SUPP: 10.0000 mg | Freq: Once | RECTAL | Status: DC

## 2021-09-19 MED ORDER — SENNOSIDES-DOCUSATE SODIUM 8.6-50 MG PO TABS
2.0000 | ORAL_TABLET | Freq: Every day | ORAL | Status: DC
  Administered 2021-09-19: 2 via ORAL
  Filled 2021-09-19: qty 2

## 2021-09-19 MED ORDER — HYDROMORPHONE HCL 1 MG/ML IJ SOLN
1.0000 mg | INTRAMUSCULAR | Status: DC | PRN
  Administered 2021-09-19 – 2021-09-25 (×13): 1 mg via INTRAVENOUS
  Filled 2021-09-19 (×13): qty 1

## 2021-09-19 MED ORDER — ONDANSETRON HCL 4 MG/2ML IJ SOLN
4.0000 mg | Freq: Four times a day (QID) | INTRAMUSCULAR | Status: DC | PRN
  Administered 2021-09-19 – 2021-10-01 (×7): 4 mg via INTRAVENOUS
  Filled 2021-09-19 (×8): qty 2

## 2021-09-19 MED ORDER — OXYCODONE HCL ER 20 MG PO T12A
30.0000 mg | EXTENDED_RELEASE_TABLET | Freq: Two times a day (BID) | ORAL | Status: DC
  Administered 2021-09-19 – 2021-09-26 (×8): 30 mg via ORAL
  Filled 2021-09-19 (×2): qty 2
  Filled 2021-09-19: qty 1
  Filled 2021-09-19 (×7): qty 2
  Filled 2021-09-19: qty 1

## 2021-09-19 MED ORDER — ENOXAPARIN SODIUM 150 MG/ML IJ SOSY
130.0000 mg | PREFILLED_SYRINGE | Freq: Two times a day (BID) | INTRAMUSCULAR | Status: DC
  Administered 2021-09-19 – 2021-09-20 (×3): 130 mg via SUBCUTANEOUS
  Filled 2021-09-19 (×3): qty 0.86

## 2021-09-19 MED ORDER — SIMETHICONE 40 MG/0.6ML PO SUSP
80.0000 mg | Freq: Four times a day (QID) | ORAL | Status: DC
  Administered 2021-09-19 – 2021-09-23 (×6): 80 mg via ORAL
  Filled 2021-09-19 (×19): qty 1.2

## 2021-09-19 MED ORDER — LINEZOLID 600 MG/300ML IV SOLN
600.0000 mg | Freq: Two times a day (BID) | INTRAVENOUS | Status: DC
  Administered 2021-09-19 – 2021-09-20 (×4): 600 mg via INTRAVENOUS
  Filled 2021-09-19 (×5): qty 300

## 2021-09-19 MED ORDER — CLONIDINE HCL 0.1 MG PO TABS
0.1000 mg | ORAL_TABLET | Freq: Two times a day (BID) | ORAL | Status: DC
  Administered 2021-09-19 – 2021-09-20 (×3): 0.1 mg via ORAL
  Filled 2021-09-19 (×3): qty 1

## 2021-09-19 MED ORDER — SENNOSIDES-DOCUSATE SODIUM 8.6-50 MG PO TABS
2.0000 | ORAL_TABLET | Freq: Two times a day (BID) | ORAL | Status: DC
  Administered 2021-09-19 – 2021-09-23 (×4): 2 via ORAL
  Filled 2021-09-19 (×7): qty 2

## 2021-09-19 NOTE — Progress Notes (Signed)
ANTICOAGULATION CONSULT NOTE - Follow Up  Pharmacy Consult for enoxaparin Indication: DVT  Allergies  Allergen Reactions   Other Other (See Comments)    Blood pressure issues Per [facility] hospital: Pt states he can only take these pain meds or else he gets very sick: Oxycontin,morphine,demerol, and dilaudid are the only pain meds pt states he can take.     Oxymorphone Other (See Comments)    Causes kidney problems   Aspirin Nausea And Vomiting    Per [facility]   Beta Vulgaris Nausea And Vomiting    Beets   Buspirone Other (See Comments)    Unknown reaction   Cabbage Nausea And Vomiting   Codeine Other (See Comments)    unknown   Dolobid [Diflunisal]     Unknown reaction   Fish Allergy Nausea And Vomiting   Fish-Derived Products Nausea And Vomiting   Methadone     Unknown reaction   Pentazocine Other (See Comments)    Unknown reaction   Propoxyphene Other (See Comments)    Unknown reaction   Shellfish Allergy Nausea And Vomiting   Sulfa Antibiotics Hives   Sulfasalazine Other (See Comments)    Unknown reaction   Vistaril [Hydroxyzine]     Unknown reaction   Amoxicillin Rash    Per [facility]    Patient Measurements: Height: 6\' 6"  (198.1 cm) Weight: 132 kg (291 lb 0.1 oz) IBW/kg (Calculated) : 91.4  Vital Signs: Temp: 98.3 F (36.8 C) (12/01 0524) Temp Source: Oral (12/01 0524) BP: 156/84 (12/01 0524) Pulse Rate: 96 (12/01 0524)  Labs: Recent Labs    09/16/21 2245 09/18/21 0934 09/19/21 0506  HGB 8.7* 8.2*  --   HCT 28.2* 26.3*  --   PLT 228 213  --   CREATININE 4.62* 3.51* 2.86*    Estimated Creatinine Clearance: 37.6 mL/min (A) (by C-G formula based on SCr of 2.86 mg/dL (H)).   Medical History: Past Medical History:  Diagnosis Date   Arthritis    BPH (benign prostatic hyperplasia) 11/24/2019   Chronic pain syndrome 11/24/2019   CKD (chronic kidney disease), stage IV (HCC) 11/24/2019   Diabetes mellitus without complication (HCC)     Gout    Hypertension    Muscle pain    Physical deconditioning 11/24/2019   Scars    Swelling     Medications: No anticoagulation PTA  Assessment: Pt is a 69 yoM admitted with LLE swelling and ARF. Currently on antibiotics for PNA, cellulitis with concern for OM. Venous doppler (11/29) revealed age indeterminate DVT involving the left femoral vein. Pharmacy consulted to dose enoxaparin for treatment of VTE.  Today, 09/19/21 SCr = 2.86, CrCl ~37 mL/min. Improving CBC: Hgb low but has been stable  Goal of Therapy:  Monitor platelets by anticoagulation protocol: Yes   Plan:  Due to improved renal function, increase dose of enoxaparin to 1 mg/kg (130 mg) subQ q12h Check CBC and SCr with AM labs tomorrow Monitor renal function  Lenis Noon, PharmD 09/19/2021,9:34 AM

## 2021-09-19 NOTE — Progress Notes (Signed)
Patient's brother called to check on status. Patient has him as a contact on chart. Verified his progress with brother.

## 2021-09-19 NOTE — Evaluation (Signed)
Physical Therapy Evaluation Patient Details Name: Thomas [name] MRN: [id number] DOB: [birth year] Today's Date: 09/19/2021  History of Present Illness  Patient is a 70 year old male who presented to hospital after brother called EMS. patient was found to have AKI, CAP,and LLE edema.  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    Clinical Impression  Thomas [name] is 70 y.o. male admitted with above HPI and diagnosis. Patient is currently limited by functional impairments below (see PT problem list). Patient lives alone and is independent with prosthesis and RW in home for mobility at baseline. Currently min guard/assist to complete bed mobility and pt was able to manage prosthetic with supervision only. Pt sat EOB for ~10 minutes and then c/o nausea, unable to have meds at this time per RN. Patient then experienced 1 episode of emesis (mucous-like) and nose bleeding slightly. Pt then c/o chills and slight tremor noticed. Max assist for safety to return to bed and pt initiated superior pull with overhead bed rail to reposition. RN notified. Pt's speech became more garbled but continued to answer questions questions appropriately. Pt has several significant full body tremors. Vitals assessed (RN called RRT).   BP: 179/103 mmHg HR: 116 (noted to be ranging from 110's-160's) SpO2: 92% on RA (2L donned and increased to 94%)  Patient is functioning below baseline/PLOF and will benefit from continued skilled PT interventions to address impairments and progress independence with mobility, recommending SNF follow up. Acute PT will follow and progress as able.        Recommendations for follow up therapy are one component of a multi-disciplinary discharge planning process, led by the attending physician.  Recommendations may be updated based on patient status, additional functional criteria and insurance  authorization.  Follow Up Recommendations Skilled nursing-short term rehab (<3 hours/day)    Assistance Recommended at Discharge Frequent or constant Supervision/Assistance  Functional Status Assessment Patient has had a recent decline in their functional status and demonstrates the ability to make significant improvements in function in a reasonable and predictable amount of time.  Equipment Recommendations  None recommended by PT (TBA)    Recommendations for Other Services       Precautions / Restrictions Precautions Precautions: Fall Precaution Comments: R BKA, Lt foot wounds Restrictions Weight Bearing Restrictions: No      Mobility  Bed Mobility Overal bed mobility: Needs Assistance Bed Mobility: Supine to Sit;Sit to Supine     Supine to sit: Min guard;Min assist;HOB elevated Sit to supine: Max assist;+2 for safety/equipment   General bed mobility comments: pt able to initiate bring LE's off EOB and using bed rail to raise trunk upright. light assist/guard to fully sit up to EOB. pt able to donn prosthesis while sitting EOB without assist, guarding for safety. pt c/o nausea and experienced 1 bout of emesis (mucous consitency). Pt continued to c/o nausea and started to shake slightly. Max assist +2 for safety to return to supine. Pt then able to use overhead bar to pull self superiorly in bed to reposition. He then experienced increased tremors/shaking and speach became more garbled. RN called and RRT called. Vitals taken (see details in clinical impression).    Transfers                        Ambulation/Gait                  Stairs  Wheelchair Mobility    Modified Rankin (Stroke Patients Only)       Balance                                             Pertinent Vitals/Pain Pain Assessment: No/denies pain    Home Living Family/patient expects to be discharged to:: Private residence Living Arrangements:  Alone Available Help at Discharge: Available PRN/intermittently Type of Home: Apartment Home Access: Stairs to enter Entrance Stairs-Rails: None Entrance Stairs-Number of Steps: 1   Home Layout: One level Home Equipment: [place] - single Environmental consultant (2 wheels)      Prior Function Prior Level of Function : Independent/Modified Independent             Mobility Comments: pt uses prosthesis and RW For mobility in home, no walker out of home. he is driving and doing all ADL's/IADL's. he takes sink baths rather than showers due to no seat in the shower.       Hand Dominance   Dominant Hand: Right    Extremity/Trunk Assessment   Upper Extremity Assessment Upper Extremity Assessment: Defer to OT evaluation;Generalized weakness    Lower Extremity Assessment Lower Extremity Assessment: Generalized weakness;RLE deficits/detail;LLE deficits/detail RLE Deficits / Details: pt with BKA, residual limb intact able to manage liner and prosthesis without assist. LLE Deficits / Details: LE dressing unravelled at start of session and re-wrapped by therapist. pt LLE Sensation: history of peripheral neuropathy    Cervical / Trunk Assessment Cervical / Trunk Assessment: Normal  Communication   Communication: No difficulties  Cognition Arousal/Alertness: Awake/alert Behavior During Therapy: Flat affect Overall Cognitive Status: No family/caregiver present to determine baseline cognitive functioning                                 General Comments: pt alert and awake at start of session answering questions appropriately. pt developed incresaed difficulty speaking with garbled speach but able to answer orientation questions still after episode of emesis at EOB and development of shaking/tremors and reporting chills.        General Comments      Exercises     Assessment/Plan    PT Assessment Patient needs continued PT services  PT Problem List Decreased  strength;Decreased activity tolerance;Decreased balance;Decreased mobility;Decreased knowledge of use of DME;Decreased safety awareness;Decreased knowledge of precautions;Obesity;Cardiopulmonary status limiting activity;Impaired sensation;Decreased skin integrity;Pain       PT Treatment Interventions DME instruction;Gait training;Stair training;Functional mobility training;Therapeutic activities;Balance training;Therapeutic exercise;Neuromuscular re-education;Cognitive remediation;Patient/family education;Wheelchair mobility training    PT Goals (Current goals can be found in the Care Plan section)  Acute Rehab PT Goals Patient Stated Goal: stop feeling nauseous PT Goal Formulation: With patient Time For Goal Achievement: 10/03/21 Potential to Achieve Goals: Fair    Frequency Min 3X/week   Barriers to discharge Decreased caregiver support pt live alone    Co-evaluation               AM-PAC PT "6 Clicks" Mobility  Outcome Measure Help needed turning from your back to your side while in a flat bed without using bedrails?: A Little Help needed moving from lying on your back to sitting on the side of a flat bed without using bedrails?: A Little Help needed moving to and from a bed to a chair (including a wheelchair)?:  A Lot Help needed standing up from a chair using your arms (e.g., wheelchair or bedside chair)?: A Lot Help needed to walk in hospital room?: Total Help needed climbing 3-5 steps with a railing? : Total 6 Click Score: 12    End of Session Equipment Utilized During Treatment: Gait belt (Rt LE prosthesis) Activity Tolerance: Treatment limited secondary to medical complications (Comment) (seizure/tremor like activity. RN notified) Patient left: in bed;with nursing/sitter in room Nurse Communication: Mobility status PT Visit Diagnosis: Unsteadiness on feet (R26.81);Difficulty in walking, not elsewhere classified (R26.2);Muscle weakness (generalized) (M62.81)     Time: 1093-2355 PT Time Calculation (min) (ACUTE ONLY): 33 min   Charges:   PT Evaluation $PT Eval Moderate Complexity: 1 Mod PT Treatments $Therapeutic Activity: 8-22 mins        Verner Mould, DPT Acute Rehabilitation Services Office [phone] Pager [phone]   Jacques Navy 09/19/2021, 12:57 PM

## 2021-09-19 NOTE — Progress Notes (Addendum)
[facility] PROGRESS NOTE    Progress Note  DAYMIAN [name]  ZOX:[id number] DOB: 1951-06-13 DOA: 09/16/2021 PCP: Antonietta Jewel, MD     Brief Narrative:   Thomas [name] is an 70 y.o. male past medical history of diabetes mellitus type 2, essential hypertension, chronic kidney disease stage IV, chronic normocytic anemia, brought into the ED via EMS due to concern by his brother not looking well, not eating or taking his medications for several days.  Upon EMS arrival, his blood glucose was 57 was given D10.  Work-up revealed AKI on CKD 4, suspected prerenal secondary to dehydration.  Also revealed left lower lobe community-acquired pneumonia, left lower extremity cellulitis and age-indeterminate left lower extremity DVT.  MRI left foot could not rule out second digit osteomyelitis.  Started on empiric IV antibiotics and full dose subcu Lovenox twice daily.  Podiatry Dr. Posey Pronto consulted, will see in consultation.  09/19/2021: Patient seen and examined at bedside.  Main concern is for back pain.  Pain medication doses increased.  MRI thoracic spine 09/17/2021 no evidence for discitis.  Assessment/Plan:   Improving acute kidney injury on chronic kidney disease stage IV: Likely prerenal secondary to dehydration, UA pyuria and proteinuria. Baseline creatinine appears to be 2.5 with GFR 25 Presented with creatinine of 4.62 Creatinine downtrending with IV fluid, 2.8 today from 3.51. Continue normal saline at 50 cc/h x 2 days. Continue to avoid nephrotoxic agents, dehydration and hypotension Continue to closely monitor urine output with strict I's and O's. Obtain renal panel in the morning.  Community-acquired pneumonia/left lower lobe pneumonia, POA: Has remained afebrile vitals are stable requiring 2 L of oxygen to keep saturations greater than 92%. Continue Rocephin and azithromycin, day number 3 out of 5.  Left lower extremity swelling concerning for cellulitis. Dopplers positive for  age-indeterminate DVT left lower extremity. MRI of the left foot, osteomyelitis of second digit could not be completely ruled out.  IV vancomycin switched to IV linezolid on 09/19/2021. Podiatry consulted, Dr. Posey Pronto will see in consultation.  Left lower extremity age-indeterminate DVT, seen on Doppler ultrasound 09/17/2021 Currently on full dose subcu Lovenox twice daily Will switch to Eliquis if no plan for surgical procedure Pending podiatry evaluation.  Low back pain with prior history of thoracic spine discitis:  MRI of the T-spine no recurrent osteomyelitis or diskitis seen.  Diabetes mellitus type 2 with hyperglycemia: Hemoglobin A1c 5.9 on 06/03/2021. Update hemoglobin A1c Insulin sensitive.  Essential hypertension: BP is not at goal, elevated Increase dose of clonidine Continue to monitor vital signs.  Anemia of chronic renal disease/iron deficiency anemia: Iron deficiency noted on iron studies. Hemoglobin 8.2 Start iron supplement  Chronic pain: Pain control and bowel regimen  Consult: Podiatry  Physical debility PT assessed and recommended SNF TOC assisting with SNF placement. Continue PT OT with assistance and fall precautions.  Obesity BMI 33 Recommend weight los outpatient with regular physical activity and healthy dieting.      ARF (acute renal failure) (HCC) Active Problems:   [name], right ([place])   [name], type 2) ([place])   HTN (hypertension)   BPH (benign prostatic hyperplasia)   Chronic pain syndrome   CAP (community acquired pneumonia)  RN Pressure Injury Documentation: Pressure Injury 06/03/21 Hip Anterior;Left Stage 1 -  Intact skin with non-blanchable redness of a localized area usually over a bony prominence. skin intact and has dark red bruising (Active)  06/03/21 0238  Location: Hip  Location Orientation: Anterior;Left  Staging: Stage 1 -  Intact skin with non-blanchable redness of a localized area usually over a  bony prominence.  Wound Description (Comments): skin intact and has dark red bruising  Present on Admission: Yes     Pressure Injury 06/03/21 Penis Mid;Circumferential Stage 2 -  Partial thickness loss of dermis presenting as a shallow open injury with a red, pink wound bed without slough. swollen, red, and weeping with slight tear on top (Active)  06/03/21 0240  Location: Penis  Location Orientation: Mid;Circumferential  Staging: Stage 2 -  Partial thickness loss of dermis presenting as a shallow open injury with a red, pink wound bed without slough.  Wound Description (Comments): swollen, red, and weeping with slight tear on top  Present on Admission: Yes    Estimated body mass index is 33.63 kg/m as calculated from the following:   Height as of this encounter: 6\' 6"  (1.981 m).   Weight as of this encounter: 132 kg.    DVT prophylaxis: Full dose subcu Lovenox twice daily Family Communication:none Status is: Inpatient.  Patient requires at least 2 midnights for further evaluation and treatment of present condition.   Code Status:     Code Status Orders  (From admission, onward)           Start     Ordered   09/17/21 0347  Full code  Continuous        09/17/21 0349           Code Status History     Date Active Date Inactive Code Status Order ID Comments User Context   06/02/2021 2155 06/07/2021 0017 Full Code [id number]  Geradine Girt, DO ED   08/30/2020 0853 10/03/2020 2226 Full Code [id number]  Mosetta Anis, MD ED   11/24/2019 0932 12/17/2019 1907 Full Code [id number]  Karmen Bongo, MD Inpatient   08/31/2019 0300 10/09/2019 1947 Full Code [id number]  [name], MD Inpatient         IV Access:   Peripheral IV   Procedures and diagnostic studies:   No results found.   Medical Consultants:  Podiatry.    Objective:    Vitals:   09/18/21 1020 09/18/21 2041 09/19/21 0524 09/19/21 1509  BP: 138/68 (!) 177/85 (!) 156/84 (!) 170/93   Pulse: 72 84 96 92  Resp: 18 20 17 19   Temp: 98.4 F (36.9 C) 98.8 F (37.1 C) 98.3 F (36.8 C) 98.7 F (37.1 C)  TempSrc: Oral Oral Oral Oral  SpO2: 97% 96% 96% 91%  Weight:      Height:       SpO2: 91 % O2 Flow Rate (L/min): 1.5 L/min   Intake/Output Summary (Last 24 hours) at 09/19/2021 1626 Last data filed at 09/19/2021 1513 Gross per 24 hour  Intake 1138 ml  Output 2900 ml  Net -1762 ml   Filed Weights   09/16/21 2200 09/18/21 0453  Weight: 130.2 kg 132 kg    Exam: General exam: Well-developed well-nourished in no acute distress.  He is alert and oriented x3.   Respiratory system: Clear to auscultation with no wheezes or rales.   Cardiovascular system: Regular rate and rhythm no rubs or gallops.   Gastrointestinal system: Nondistended normal bowel sounds present.   Extremities: Right below the knee amputation.   Skin: Left foot ulcer at the tip of the second digit.  Erythema with chronic venous stasis left lower extremity.   Psychiatry: Mood is appropriate for condition and setting.   Neuro:  Nonfocal exam.  Data Reviewed:    Labs: Basic Metabolic Panel: Recent Labs  Lab 09/16/21 2245 09/18/21 0934 09/19/21 0506  NA 135 140  --   K 4.3 4.7  --   CL 104 113*  --   CO2 20* 21*  --   GLUCOSE 86 106*  --   BUN 73* 58*  --   CREATININE 4.62* 3.51* 2.86*  CALCIUM 7.3* 7.1*  --    GFR Estimated Creatinine Clearance: 37.6 mL/min (A) (by C-G formula based on SCr of 2.86 mg/dL (H)). Liver Function Tests: Recent Labs  Lab 09/16/21 2245  AST 26  ALT 11  ALKPHOS 82  BILITOT 0.5  PROT 8.2*  ALBUMIN 3.1*   No results for input(s): LIPASE, AMYLASE in the last 168 hours. No results for input(s): AMMONIA in the last 168 hours. Coagulation profile No results for input(s): INR, PROTIME in the last 168 hours. COVID-19 Labs  Recent Labs    09/16/21 2245 09/17/21 0643  DDIMER 4.41*  --   FERRITIN  --  124    Lab Results  Component Value Date    SARSCOV2NAA NEGATIVE 09/16/2021   SARSCOV2NAA NEGATIVE 06/06/2021   SARSCOV2NAA NEGATIVE 06/02/2021   [place] NEGATIVE 10/05/2020    CBC: Recent Labs  Lab 09/16/21 2245 09/18/21 0934  WBC 7.0 5.2  NEUTROABS 5.7  --   HGB 8.7* 8.2*  HCT 28.2* 26.3*  MCV 101.8* 100.4*  PLT 228 213   Cardiac Enzymes: No results for input(s): CKTOTAL, CKMB, CKMBINDEX, TROPONINI in the last 168 hours. BNP (last 3 results) No results for input(s): PROBNP in the last 8760 hours. CBG: Recent Labs  Lab 09/18/21 2044 09/19/21 0002 09/19/21 0529 09/19/21 0723 09/19/21 1225  GLUCAP 115* 134* 121* 118* 136*   D-Dimer: Recent Labs    09/16/21 2245  DDIMER 4.41*   Hgb A1c: No results for input(s): HGBA1C in the last 72 hours. Lipid Profile: No results for input(s): CHOL, HDL, LDLCALC, TRIG, CHOLHDL, LDLDIRECT in the last 72 hours. Thyroid function studies: No results for input(s): TSH, T4TOTAL, T3FREE, THYROIDAB in the last 72 hours.  Invalid input(s): FREET3 Anemia work up: Recent Labs    09/17/21 0643  VITAMINB12 796  FOLATE 18.0  FERRITIN 124  TIBC 193*  IRON 18*  RETICCTPCT 1.6   Sepsis Labs: Recent Labs  Lab 09/16/21 2245 09/18/21 0934  PROCALCITON  --  0.12  WBC 7.0 5.2  LATICACIDVEN 0.8  --    Microbiology Recent Results (from the past 240 hour(s))  Blood culture (routine x 2)     Status: None (Preliminary result)   Collection Time: 09/16/21 10:45 PM   Specimen: BLOOD  Result Value Ref Range Status   Specimen Description   Final    BLOOD BLOOD RIGHT ARM Performed at [facility], [place] [address], [place], [place] [address]    Special Requests   Final    BOTTLES DRAWN AEROBIC AND ANAEROBIC Blood Culture adequate volume Performed at [place] [address] [place], [place] [address]    Culture   Final    NO GROWTH 2 DAYS Performed at [place] [address], [place], [place] [address]    Report Status  PENDING  Incomplete  Resp Panel by RT-PCR (Flu A&B, Covid)     Status: None   Collection Time: 09/16/21 10:45 PM   Specimen: Nasopharyngeal(NP) swabs in vial transport medium  Result Value Ref Range Status   SARS Coronavirus  2 by RT PCR NEGATIVE NEGATIVE Final    Comment: (NOTE) SARS-CoV-2 target nucleic acids are NOT DETECTED.  The SARS-CoV-2 RNA is generally detectable in upper respiratory specimens during the acute phase of infection. The lowest concentration of SARS-CoV-2 viral copies this assay can detect is 138 copies/mL. A negative result does not preclude SARS-Cov-2 infection and should not be used as the sole basis for treatment or other patient management decisions. A negative result may occur with  improper specimen collection/handling, submission of specimen other than nasopharyngeal swab, presence of viral mutation(s) within the areas targeted by this assay, and inadequate number of viral copies(<138 copies/mL). A negative result must be combined with clinical observations, patient history, and epidemiological information. The expected result is Negative.  Fact Sheet for Patients:  EntrepreneurPulse.com.au  Fact Sheet for Healthcare Providers:  IncredibleEmployment.be  This test is no t yet approved or cleared by the [place] FDA and  has been authorized for detection and/or diagnosis of SARS-CoV-2 by FDA under an Emergency Use Authorization (EUA). This EUA will remain  in effect (meaning this test can be used) for the duration of the COVID-19 declaration under Section 564(b)(1) of the Act, 21 U.S.C.section 360bbb-3(b)(1), unless the authorization is terminated  or revoked sooner.       Influenza A by PCR NEGATIVE NEGATIVE Final   Influenza B by PCR NEGATIVE NEGATIVE Final    Comment: (NOTE) The Xpert Xpress SARS-CoV-2/FLU/RSV plus assay is intended as an aid in the diagnosis of influenza from Nasopharyngeal swab specimens  and should not be used as a sole basis for treatment. Nasal washings and aspirates are unacceptable for Xpert Xpress SARS-CoV-2/FLU/RSV testing.  Fact Sheet for Patients: EntrepreneurPulse.com.au  Fact Sheet for Healthcare Providers: IncredibleEmployment.be  This test is not yet approved or cleared by the [place] FDA and has been authorized for detection and/or diagnosis of SARS-CoV-2 by FDA under an Emergency Use Authorization (EUA). This EUA will remain in effect (meaning this test can be used) for the duration of the COVID-19 declaration under Section 564(b)(1) of the Act, 21 U.S.C. section 360bbb-3(b)(1), unless the authorization is terminated or revoked.  Performed at [facility], [place] [address] [place], [place] [address]   Urine Culture     Status: None   Collection Time: 09/18/21 12:25 PM   Specimen: Urine, Clean Catch  Result Value Ref Range Status   Specimen Description   Final    URINE, CLEAN CATCH Performed at [facility], [place] [address], [place], [place] [address]    Special Requests   Final    NONE Performed at [facility], [place] [address], [place], [place] [address]    Culture   Final    NO GROWTH Performed at [facility], [place] [address] [place], [place] [address]    Report Status 09/19/2021 FINAL  Final     Medications:    allopurinol  50 mg Oral QODAY   atorvastatin  80 mg Oral Daily   bisacodyl  10 mg Rectal Once   cloNIDine  0.1 mg Oral Daily   colchicine  0.6 mg Oral QODAY   enoxaparin (LOVENOX) injection  130 mg Subcutaneous Q12H   finasteride  5 mg Oral Daily   gabapentin  300 mg Oral QHS   insulin aspart  0-9 Units Subcutaneous TID WC   levothyroxine  50 mcg Oral Q0600   mouth rinse  15 mL Mouth Rinse BID   naloxegol oxalate  12.5  mg Oral Daily   oxyCODONE  30 mg Oral Q12H   senna-docusate  2 tablet Oral BID   simethicone  80 mg  Oral QID   tamsulosin  0.4 mg Oral Daily   vitamin B-12  200 mcg Oral Daily   Continuous Infusions:  sodium chloride 50 mL/hr at 09/19/21 0600   azithromycin 500 mg (09/19/21 0401)   cefTRIAXone (ROCEPHIN)  IV 2 g (09/18/21 2121)   linezolid (ZYVOX) IV 600 mg (09/19/21 1308)      LOS: 2 days   Kayleen Memos  [facility]  09/19/2021, 4:26 PM

## 2021-09-20 DIAGNOSIS — M86172 Other acute osteomyelitis, left ankle and foot: Secondary | ICD-10-CM

## 2021-09-20 DIAGNOSIS — N179 Acute kidney failure, unspecified: Secondary | ICD-10-CM | POA: Diagnosis not present

## 2021-09-20 LAB — RENAL FUNCTION PANEL
Albumin: 2.8 g/dL — ABNORMAL LOW (ref 3.5–5.0)
Anion gap: 7 (ref 5–15)
BUN: 30 mg/dL — ABNORMAL HIGH (ref 8–23)
CO2: 22 mmol/L (ref 22–32)
Calcium: 7.5 mg/dL — ABNORMAL LOW (ref 8.9–10.3)
Chloride: 109 mmol/L (ref 98–111)
Creatinine, Ser: 2.35 mg/dL — ABNORMAL HIGH (ref 0.61–1.24)
GFR, Estimated: 29 mL/min — ABNORMAL LOW (ref >60)
Glucose, Bld: 111 mg/dL — ABNORMAL HIGH (ref 70–99)
Phosphorus: 3.8 mg/dL (ref 2.5–4.6)
Potassium: 4.9 mmol/L (ref 3.5–5.1)
Sodium: 138 mmol/L (ref 135–145)

## 2021-09-20 LAB — CREATININE, SERUM
Creatinine, Ser: 2.33 mg/dL — ABNORMAL HIGH (ref 0.61–1.24)
GFR, Estimated: 30 mL/min — ABNORMAL LOW (ref >60)

## 2021-09-20 LAB — GLUCOSE, CAPILLARY
Glucose-Capillary: 109 mg/dL — ABNORMAL HIGH (ref 70–99)
Glucose-Capillary: 112 mg/dL — ABNORMAL HIGH (ref 70–99)
Glucose-Capillary: 119 mg/dL — ABNORMAL HIGH (ref 70–99)
Glucose-Capillary: 129 mg/dL — ABNORMAL HIGH (ref 70–99)
Glucose-Capillary: 93 mg/dL (ref 70–99)
Glucose-Capillary: 97 mg/dL (ref 70–99)

## 2021-09-20 LAB — CBC
HCT: 29.1 % — ABNORMAL LOW (ref 39.0–52.0)
Hemoglobin: 9.1 g/dL — ABNORMAL LOW (ref 13.0–17.0)
MCH: 31.3 pg (ref 26.0–34.0)
MCHC: 31.3 g/dL (ref 30.0–36.0)
MCV: 100 fL (ref 80.0–100.0)
Platelets: 238 10*3/uL (ref 150–400)
RBC: 2.91 MIL/uL — ABNORMAL LOW (ref 4.22–5.81)
RDW: 14 % (ref 11.5–15.5)
WBC: 5.6 10*3/uL (ref 4.0–10.5)
nRBC: 0 % (ref 0.0–0.2)

## 2021-09-20 LAB — HEMOGLOBIN A1C
Hgb A1c MFr Bld: 6 % — ABNORMAL HIGH (ref 4.8–5.6)
Mean Plasma Glucose: 125.5 mg/dL

## 2021-09-20 MED ORDER — GABAPENTIN 300 MG PO CAPS
300.0000 mg | ORAL_CAPSULE | Freq: Three times a day (TID) | ORAL | Status: DC
  Administered 2021-09-20 – 2021-09-23 (×4): 300 mg via ORAL
  Filled 2021-09-20 (×6): qty 1

## 2021-09-20 MED ORDER — SODIUM CHLORIDE 0.9 % IV SOLN
12.5000 mg | Freq: Four times a day (QID) | INTRAVENOUS | Status: DC | PRN
  Administered 2021-09-20 – 2021-09-25 (×2): 12.5 mg via INTRAVENOUS
  Filled 2021-09-20 (×2): qty 12.5

## 2021-09-20 MED ORDER — COLCHICINE 0.6 MG PO TABS
0.6000 mg | ORAL_TABLET | Freq: Every day | ORAL | Status: DC
  Filled 2021-09-20 (×2): qty 1

## 2021-09-20 MED ORDER — APIXABAN 5 MG PO TABS: 5.0000 mg | ORAL_TABLET | Freq: Two times a day (BID) | ORAL | Status: DC

## 2021-09-20 MED ORDER — APIXABAN 5 MG PO TABS
10.0000 mg | ORAL_TABLET | Freq: Two times a day (BID) | ORAL | Status: DC
  Administered 2021-09-20 – 2021-09-21 (×2): 10 mg via ORAL
  Filled 2021-09-20 (×3): qty 2

## 2021-09-20 MED ORDER — ALLOPURINOL 100 MG PO TABS
100.0000 mg | ORAL_TABLET | Freq: Every day | ORAL | Status: DC
  Filled 2021-09-20 (×2): qty 1

## 2021-09-20 NOTE — Discharge Instructions (Signed)
Information on my medicine - ELIQUIS (apixaban)  This medication education was reviewed with me or my healthcare representative as part of my discharge preparation.    Why was Eliquis prescribed for you? Eliquis was prescribed to treat blood clots that may have been found in the veins of your legs (deep vein thrombosis) or in your lungs (pulmonary embolism) and to reduce the risk of them occurring again.  What do You need to know about Eliquis ? The starting dose is 10 mg (two 5 mg tablets) taken TWICE daily for the FIRST SEVEN (7) DAYS, then on 09/27/21  the dose is reduced to ONE 5 mg tablet taken TWICE daily.  Eliquis may be taken with or without food.   Try to take the dose about the same time in the morning and in the evening. If you have difficulty swallowing the tablet whole please discuss with your pharmacist how to take the medication safely.  Take Eliquis exactly as prescribed and DO NOT stop taking Eliquis without talking to the doctor who prescribed the medication.  Stopping may increase your risk of developing a new blood clot.  Refill your prescription before you run out.  After discharge, you should have regular check-up appointments with your healthcare provider that is prescribing your Eliquis.    What do you do if you miss a dose? If a dose of ELIQUIS is not taken at the scheduled time, take it as soon as possible on the same day and twice-daily administration should be resumed. The dose should not be doubled to make up for a missed dose.  Important Safety Information A possible side effect of Eliquis is bleeding. You should call your healthcare provider right away if you experience any of the following: Bleeding from an injury or your nose that does not stop. Unusual colored urine (red or dark brown) or unusual colored stools (red or black). Unusual bruising for unknown reasons. A serious fall or if you hit your head (even if there is no bleeding).  Some  medicines may interact with Eliquis and might increase your risk of bleeding or clotting while on Eliquis. To help avoid this, consult your healthcare provider or pharmacist prior to using any new prescription or non-prescription medications, including herbals, vitamins, non-steroidal anti-inflammatory drugs (NSAIDs) and supplements.  This website has more information on Eliquis (apixaban): http://www.eliquis.com/eliquis/home

## 2021-09-20 NOTE — TOC Initial Note (Signed)
Transition of Care [facility]) - Initial/Assessment Note    Patient Details  Name: Thomas [name] MRN: [id number] Date of Birth: 08-23-1951  [facility] CM/SW Contact:    Lynnell Catalan, RN Phone Number: 09/20/2021, 1:23 PM  Clinical Narrative:                 Spoke with pt at bedside for dc planning. Pt adamantly refuses SNF placement and states that he will go back home with help from his brother. Choice offered for home health services and [facility] chosen. [facility] liaison contacted for referral. Pt has RW, cane and shower chair at home currently.  Expected Discharge Plan: [place] Barriers to Discharge: Continued Medical Work up   Patient Goals and CMS Choice Patient states their goals for this hospitalization and ongoing recovery are:: To go home CMS Medicare.gov Compare Post Acute Care list provided to:: Patient Choice offered to / list presented to : Patient  Expected Discharge Plan and Services Expected Discharge Plan: [place]   Discharge Planning Services: CM Consult Post Acute Care Choice: [place] arrangements for the past 2 months: Apartment                           HH Arranged: RN, PT, OT, Nurse's Aide, Social Work [employer] Agency: [place] Date [place]: 09/20/21 Time [place]: 1322 Representative spoke with at [place]: [place] Arrangements/Services Living arrangements for the past 2 months: [place] with:: Self Patient language and need for interpreter reviewed:: Yes Do you feel safe going back to the place where you live?: Yes      Need for Family Participation in Patient Care: Yes (Comment) Care giver support system in place?: Yes (comment)   Criminal Activity/Legal Involvement Pertinent to Current Situation/Hospitalization: No - Comment as needed  Activities of Daily Living Home Assistive Devices/Equipment: Eyeglasses, CBG Meter, Prosthesis,  Cane (specify quad or straight), Crutches, Walker (specify type) (single point cane, front wheeled walker, right lower extremity prothesis) ADL Screening (condition at time of admission) Patient's cognitive ability adequate to safely complete daily activities?: Yes Is the patient deaf or have difficulty hearing?: No Does the patient have difficulty seeing, even when wearing glasses/contacts?: No Does the patient have difficulty concentrating, remembering, or making decisions?: No Patient able to express need for assistance with ADLs?: Yes Does the patient have difficulty dressing or bathing?: Yes Independently performs ADLs?: No Communication: Independent Dressing (OT): Needs assistance Is this a change from baseline?: Change from baseline, expected to last >3 days Grooming: Needs assistance Is this a change from baseline?: Change from baseline, expected to last >3 days Feeding: Needs assistance Is this a change from baseline?: Change from baseline, expected to last >3 days Bathing: Needs assistance Is this a change from baseline?: Change from baseline, expected to last >3 days Toileting: Needs assistance Is this a change from baseline?: Change from baseline, expected to last >3days In/Out Bed: Needs assistance Is this a change from baseline?: Change from baseline, expected to last >3 days Walks in Home: Needs assistance Is this a change from baseline?: Change from baseline, expected to last >3 days Does the patient have difficulty walking or climbing stairs?: Yes (secondary to chronic back pain) Weakness of Legs: Left Weakness of Arms/Hands: None  Permission Sought/Granted Permission sought to share information with : Facility Art therapist granted to share information  with : Yes, Verbal Permission Granted     Permission granted to share info w AGENCY: [place]        Emotional Assessment Appearance:: Appears stated age Attitude/Demeanor/Rapport:  Self-Confident Affect (typically observed): Frustrated Orientation: : Oriented to Self, Oriented to Place, Oriented to  Time, Oriented to Situation Alcohol / Substance Use: Not Applicable Psych Involvement: No (comment)  Admission diagnosis:  ARF (acute renal failure) (HCC) [N17.9] Cellulitis of left lower extremity [L03.116] AKI (acute kidney injury) ([place]) [N17.9] Pneumonia of left lower lobe due to infectious organism [J18.9] Patient Active Problem List   Diagnosis Date Noted   ARF (acute renal failure) ([place]) 09/17/2021   CAP (community acquired pneumonia) 09/17/2021   Cellulitis of left lower extremity    Pressure injury of skin 06/03/2021   AKI (acute kidney injury) ([place]) 06/02/2021   Pain management    Cord compression ([place])    Drug-induced constipation    Acute midline low back pain with sciatica    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 ([place]) 08/30/2020   Urinary retention 12/04/2019   BPH (benign prostatic hyperplasia) 11/24/2019   CKD (chronic kidney disease), stage IV ([place]) 11/24/2019   Chronic pain syndrome 11/24/2019   Physical deconditioning 11/24/2019   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[place]) 08/31/2019   Pulmonary emboli ([place]) 09/29/2018   [name], right ([place]) 09/29/2018   [name], type 2) ([place]) 09/29/2018   Gout 09/29/2018   HTN (hypertension) 09/29/2018   CHF (congestive heart failure), NYHA class I, chronic, diastolic ([place]) 50/35/4656   GERD (gastroesophageal reflux disease) 09/29/2018   PCP:  Antonietta Jewel, MD Pharmacy:   [place], [place] [facility] [address] [place] [address] Phone: [phone] Fax: [phone]     Social Determinants of Health (SDOH) Interventions    Readmission Risk Interventions Readmission Risk Prevention Plan 09/20/2021 09/17/2019  Transportation Screening Complete Complete  PCP or Specialist Appt within 5-7 Days - Not Complete  Not Complete comments - snf vs home   PCP or Specialist Appt within 3-5 Days Complete -  Home Care Screening - Complete  Medication Review (RN CM) - Complete  HRI or Home Care Consult Complete -  Social Work Consult for [place] Planning/Counseling Complete -  Palliative Care Screening Not Applicable -  Medication Review (RN Care Manager) Complete -  Some recent data might be hidden

## 2021-09-20 NOTE — Progress Notes (Addendum)
[facility] PROGRESS NOTE    Progress Note  Thomas [name]  BPZ:[id number] DOB: 09/06/1951 DOA: 09/16/2021 PCP: Antonietta Jewel, MD     Brief Narrative:   Thomas [name] is an 70 y.o. male past medical history of diabetes mellitus type 2, essential hypertension, chronic kidney disease stage IV, chronic normocytic anemia, brought into the ED via EMS due to concern by his brother not looking well, not eating or taking his medications for several days.  Upon EMS arrival, his blood glucose was 57 was given D10.  Work-up revealed AKI on CKD 4, suspected prerenal secondary to dehydration.  Also revealed left lower lobe community-acquired pneumonia, left lower extremity cellulitis and age-indeterminate left lower extremity DVT.  MRI left foot could not rule out second digit osteomyelitis.  Started on empiric IV antibiotics and full dose subcu Lovenox twice daily.  Podiatry Dr. Posey Pronto consulted, will see in consultation. MRI thoracic spine 09/17/2021 no evidence for discitis.  Seen by podiatry Dr. Posey Pronto, plan to follow-up outpatient.  No evidence of infection or osteomyelitis to the second digits.  No local wound care indicated.  Okay to be discharged from podiatry standpoint.  We need to follow-up with podiatry outpatient.  09/20/2021: Patient was seen at bedside.  Reports nausea and dry heaving this morning. Assessment/Plan:   Improving acute kidney injury on chronic kidney disease stage IV: Likely prerenal secondary to dehydration, UA pyuria and proteinuria. Baseline creatinine appears to be 2.5 with GFR 25 Presented with creatinine of 4.62 Creatinine downtrending with IV fluid, 2.8 today from 3.51. Continue normal saline at 50 cc/h x 2 days. Continue to avoid nephrotoxic agents, dehydration and hypotension Continue to closely monitor urine output with strict I's and O's. Obtain renal panel in the morning.  Community-acquired pneumonia/left lower lobe pneumonia, POA: Has remained afebrile  vitals are stable requiring 2 L of oxygen to keep saturations greater than 92%. Continue Rocephin and azithromycin, day number 3 out of 5.  Left lower extremity swelling concerning for cellulitis, resolving. Dopplers positive for age-indeterminate DVT left lower extremity. MRI of the left foot, osteomyelitis of second digit could not be completely ruled out.  Seen by podiatry Dr. Posey Pronto, plan to follow-up outpatient.  No evidence of infection or osteomyelitis to the second digits.  No local wound care indicated.  Okay to be discharged from podiatry standpoint.  We need to follow-up with podiatry outpatient.  IV vancomycin switched to IV linezolid on 09/19/2021.  Left lower extremity age-indeterminate DVT, seen on Doppler ultrasound 09/17/2021 Currently on full dose subcu Lovenox twice daily Switched to Eliquis on 09/20/2021.  Nausea, possibly related to constipation IV antiemetics Bowel regimen.  Low back pain with prior history of thoracic spine discitis:  MRI of the T-spine no recurrent osteomyelitis or diskitis seen.  Diabetes mellitus type 2 with hyperglycemia: Hemoglobin A1c 5.9 on 06/03/2021. Update hemoglobin A1c Insulin sensitive.  Essential hypertension: BP is not at goal, elevated Increase dose of clonidine Continue to monitor vital signs.  Anemia of chronic renal disease/iron deficiency anemia: Iron deficiency noted on iron studies. Hemoglobin 8.2 Start iron supplement  Chronic pain: Pain control and bowel regimen  Consult: Podiatry  Physical debility PT assessed and recommended SNF TOC assisting with SNF placement. Continue PT OT with assistance and fall precautions.  Obesity BMI 33 Recommend weight los outpatient with regular physical activity and healthy dieting.      ARF (acute renal failure) (HCC) Active Problems:   [name], right ([place])   DM type  2 (diabetes mellitus, type 2) (HCC)   HTN (hypertension)   BPH (benign prostatic hyperplasia)    Chronic pain syndrome   CAP (community acquired pneumonia)  RN Pressure Injury Documentation: Pressure Injury 06/03/21 Hip Anterior;Left Stage 1 -  Intact skin with non-blanchable redness of a localized area usually over a bony prominence. skin intact and has dark red bruising (Active)  06/03/21 0238  Location: Hip  Location Orientation: Anterior;Left  Staging: Stage 1 -  Intact skin with non-blanchable redness of a localized area usually over a bony prominence.  Wound Description (Comments): skin intact and has dark red bruising  Present on Admission: Yes     Pressure Injury 06/03/21 Penis Mid;Circumferential Stage 2 -  Partial thickness loss of dermis presenting as a shallow open injury with a red, pink wound bed without slough. swollen, red, and weeping with slight tear on top (Active)  06/03/21 0240  Location: Penis  Location Orientation: Mid;Circumferential  Staging: Stage 2 -  Partial thickness loss of dermis presenting as a shallow open injury with a red, pink wound bed without slough.  Wound Description (Comments): swollen, red, and weeping with slight tear on top  Present on Admission: Yes    Estimated body mass index is 33.63 kg/m as calculated from the following:   Height as of this encounter: 6\' 6"  (1.981 m).   Weight as of this encounter: 132 kg.    DVT prophylaxis: Eliquis Family Communication:none Status is: Inpatient.  Patient requires at least 2 midnights for further evaluation and treatment of present condition.   Code Status:     Code Status Orders  (From admission, onward)           Start     Ordered   09/17/21 0347  Full code  Continuous        09/17/21 0349           Code Status History     Date Active Date Inactive Code Status Order ID Comments User Context   06/02/2021 2155 06/07/2021 0017 Full Code [id number]  Geradine Girt, DO ED   08/30/2020 0853 10/03/2020 2226 Full Code [id number]  Mosetta Anis, MD ED   11/24/2019 0932 12/17/2019  1907 Full Code [id number]  Karmen Bongo, MD Inpatient   08/31/2019 0300 10/09/2019 1947 Full Code [id number]  [name], MD Inpatient         IV Access:   Peripheral IV   Procedures and diagnostic studies:   No results found.   Medical Consultants:  Podiatry.    Objective:    Vitals:   09/19/21 2038 09/20/21 0428 09/20/21 0930 09/20/21 1413  BP: (!) 178/103 (!) 163/95    Pulse: 88 88    Resp: 19 18 18 18   Temp: 98.7 F (37.1 C) 98.7 F (37.1 C)    TempSrc: Oral Oral    SpO2: 92% 92%    Weight:      Height:       SpO2: 92 % O2 Flow Rate (L/min): 1.5 L/min   Intake/Output Summary (Last 24 hours) at 09/20/2021 1415 Last data filed at 09/20/2021 1153 Gross per 24 hour  Intake 1814.43 ml  Output 4650 ml  Net -2835.57 ml   Filed Weights   09/16/21 2200 09/18/21 0453  Weight: 130.2 kg 132 kg    Exam: General exam: Well-developed well-nourished in no acute distress.  He is alert and oriented x3. Respiratory system: Clear to auscultation with no wheezes or rales.   Cardiovascular system:  Regular rate and rhythm no rubs or gallops.   Gastrointestinal system: Nondistended normal bowel sounds present.   Extremities: Right below the knee amputation  skin: Improved cellulitis.   Psychiatry: Mood is appropriate for condition and setting. Neuro: Nonfocal exam.  Data Reviewed:    Labs: Basic Metabolic Panel: Recent Labs  Lab 09/16/21 2245 09/18/21 0934 09/19/21 0506 09/20/21 0613  NA 135 140  --  138  K 4.3 4.7  --  4.9  CL 104 113*  --  109  CO2 20* 21*  --  22  GLUCOSE 86 106*  --  111*  BUN 73* 58*  --  30*  CREATININE 4.62* 3.51* 2.86* 2.35*  2.33*  CALCIUM 7.3* 7.1*  --  7.5*  PHOS  --   --   --  3.8   GFR Estimated Creatinine Clearance: 46.2 mL/min (A) (by C-G formula based on SCr of 2.33 mg/dL (H)). Liver Function Tests: Recent Labs  Lab 09/16/21 2245 09/20/21 0613  AST 26  --   ALT 11  --   ALKPHOS 82  --   BILITOT 0.5   --   PROT 8.2*  --   ALBUMIN 3.1* 2.8*   No results for input(s): LIPASE, AMYLASE in the last 168 hours. No results for input(s): AMMONIA in the last 168 hours. Coagulation profile No results for input(s): INR, PROTIME in the last 168 hours. COVID-19 Labs  No results for input(s): DDIMER, FERRITIN, LDH, CRP in the last 72 hours.   Lab Results  Component Value Date   SARSCOV2NAA NEGATIVE 09/16/2021   SARSCOV2NAA NEGATIVE 06/06/2021   [place] NEGATIVE 06/02/2021   [place] NEGATIVE 10/05/2020    CBC: Recent Labs  Lab 09/16/21 2245 09/18/21 0934 09/20/21 0613  WBC 7.0 5.2 5.6  NEUTROABS 5.7  --   --   HGB 8.7* 8.2* 9.1*  HCT 28.2* 26.3* 29.1*  MCV 101.8* 100.4* 100.0  PLT 228 213 238   Cardiac Enzymes: No results for input(s): CKTOTAL, CKMB, CKMBINDEX, TROPONINI in the last 168 hours. BNP (last 3 results) No results for input(s): PROBNP in the last 8760 hours. CBG: Recent Labs  Lab 09/19/21 1939 09/19/21 2339 09/20/21 0348 09/20/21 0800 09/20/21 1147  GLUCAP 147* 129* 119* 93 109*   D-Dimer: No results for input(s): DDIMER in the last 72 hours.  Hgb A1c: Recent Labs    09/20/21 0613  HGBA1C 6.0*   Lipid Profile: No results for input(s): CHOL, HDL, LDLCALC, TRIG, CHOLHDL, LDLDIRECT in the last 72 hours. Thyroid function studies: No results for input(s): TSH, T4TOTAL, T3FREE, THYROIDAB in the last 72 hours.  Invalid input(s): FREET3 Anemia work up: No results for input(s): VITAMINB12, FOLATE, FERRITIN, TIBC, IRON, RETICCTPCT in the last 72 hours.  Sepsis Labs: Recent Labs  Lab 09/16/21 2245 09/18/21 0934 09/20/21 0613  PROCALCITON  --  0.12  --   WBC 7.0 5.2 5.6  LATICACIDVEN 0.8  --   --    Microbiology Recent Results (from the past 240 hour(s))  Blood culture (routine x 2)     Status: None (Preliminary result)   Collection Time: 09/16/21 10:45 PM   Specimen: BLOOD  Result Value Ref Range Status   Specimen Description   Final     BLOOD BLOOD RIGHT ARM Performed at [facility] [address] [place],  [address]    Special Requests   Final    BOTTLES DRAWN AEROBIC AND ANAEROBIC Blood Culture adequate volume Performed at [facility],  [place] [address] [place], [place] [address]    Culture   Final    NO GROWTH 3 DAYS Performed at [facility], [place] [address], [place], [place] [address]    Report Status PENDING  Incomplete  Resp Panel by RT-PCR (Flu A&B, Covid)     Status: None   Collection Time: 09/16/21 10:45 PM   Specimen: Nasopharyngeal(NP) swabs in vial transport medium  Result Value Ref Range Status   SARS Coronavirus 2 by RT PCR NEGATIVE NEGATIVE Final    Comment: (NOTE) SARS-CoV-2 target nucleic acids are NOT DETECTED.  The SARS-CoV-2 RNA is generally detectable in upper respiratory specimens during the acute phase of infection. The lowest concentration of SARS-CoV-2 viral copies this assay can detect is 138 copies/mL. A negative result does not preclude SARS-Cov-2 infection and should not be used as the sole basis for treatment or other patient management decisions. A negative result may occur with  improper specimen collection/handling, submission of specimen other than nasopharyngeal swab, presence of viral mutation(s) within the areas targeted by this assay, and inadequate number of viral copies(<138 copies/mL). A negative result must be combined with clinical observations, patient history, and epidemiological information. The expected result is Negative.  Fact Sheet for Patients:  EntrepreneurPulse.com.au  Fact Sheet for Healthcare Providers:  IncredibleEmployment.be  This test is no t yet approved or cleared by the [place] FDA and  has been authorized for detection and/or diagnosis of SARS-CoV-2 by FDA under an Emergency Use Authorization (EUA). This EUA will remain  in effect (meaning this test can  be used) for the duration of the COVID-19 declaration under Section 564(b)(1) of the Act, 21 U.S.C.section 360bbb-3(b)(1), unless the authorization is terminated  or revoked sooner.       Influenza A by PCR NEGATIVE NEGATIVE Final   Influenza B by PCR NEGATIVE NEGATIVE Final    Comment: (NOTE) The Xpert Xpress SARS-CoV-2/FLU/RSV plus assay is intended as an aid in the diagnosis of influenza from Nasopharyngeal swab specimens and should not be used as a sole basis for treatment. Nasal washings and aspirates are unacceptable for Xpert Xpress SARS-CoV-2/FLU/RSV testing.  Fact Sheet for Patients: EntrepreneurPulse.com.au  Fact Sheet for Healthcare Providers: IncredibleEmployment.be  This test is not yet approved or cleared by the [place] FDA and has been authorized for detection and/or diagnosis of SARS-CoV-2 by FDA under an Emergency Use Authorization (EUA). This EUA will remain in effect (meaning this test can be used) for the duration of the COVID-19 declaration under Section 564(b)(1) of the Act, 21 U.S.C. section 360bbb-3(b)(1), unless the authorization is terminated or revoked.  Performed at [facility], [place] [address], [place], [place] [address]   Urine Culture     Status: None   Collection Time: 09/18/21 12:25 PM   Specimen: Urine, Clean Catch  Result Value Ref Range Status   Specimen Description   Final    URINE, CLEAN CATCH Performed at [facility], [place] [address], [place], [place] [address]    Special Requests   Final    NONE Performed at [facility], [place] [address], [place], [place] [address]    Culture   Final    NO GROWTH Performed at [facility], [place] [address] [place], [place] [address]    Report Status 09/19/2021 FINAL  Final     Medications:    [START ON 09/21/2021] allopurinol  100 mg Oral Daily   atorvastatin  80 mg Oral  Daily   bisacodyl   10 mg Rectal Once   cloNIDine  0.1 mg Oral BID   [START ON 09/21/2021] colchicine  0.6 mg Oral Daily   enoxaparin (LOVENOX) injection  130 mg Subcutaneous Q12H   ferrous sulfate  325 mg Oral Q breakfast   finasteride  5 mg Oral Daily   gabapentin  300 mg Oral TID   insulin aspart  0-9 Units Subcutaneous TID WC   levothyroxine  50 mcg Oral Q0600   mouth rinse  15 mL Mouth Rinse BID   naloxegol oxalate  12.5 mg Oral Daily   oxyCODONE  30 mg Oral Q12H   senna-docusate  2 tablet Oral BID   simethicone  80 mg Oral QID   tamsulosin  0.4 mg Oral Daily   vitamin B-12  200 mcg Oral Daily   Continuous Infusions:  azithromycin Stopped (09/20/21 0450)   cefTRIAXone (ROCEPHIN)  IV Stopped (09/19/21 2350)   linezolid (ZYVOX) IV 600 mg (09/20/21 0943)   promethazine (PHENERGAN) injection (IM or IVPB)        LOS: 3 days   Kayleen Memos  [facility]  09/20/2021, 2:15 PM

## 2021-09-20 NOTE — Progress Notes (Signed)
ANTICOAGULATION CONSULT NOTE - Follow Up Consult  Pharmacy Consult for lovenox --> Eliquis Indication: DVT  Allergies  Allergen Reactions   Other Other (See Comments)    Blood pressure issues Per [facility] hospital: Pt states he can only take these pain meds or else he gets very sick: Oxycontin,morphine,demerol, and dilaudid are the only pain meds pt states he can take.     Oxymorphone Other (See Comments)    Causes kidney problems   Aspirin Nausea And Vomiting    Per [facility]   Beta Vulgaris Nausea And Vomiting    Beets   Buspirone Other (See Comments)    Unknown reaction   Cabbage Nausea And Vomiting   Codeine Other (See Comments)    unknown   Dolobid [Diflunisal]     Unknown reaction   Fish Allergy Nausea And Vomiting   Fish-Derived Products Nausea And Vomiting   Methadone     Unknown reaction   Pentazocine Other (See Comments)    Unknown reaction   Propoxyphene Other (See Comments)    Unknown reaction   Shellfish Allergy Nausea And Vomiting   Sulfa Antibiotics Hives   Sulfasalazine Other (See Comments)    Unknown reaction   Vistaril [Hydroxyzine]     Unknown reaction   Amoxicillin Rash    Per [facility]    Patient Measurements: Height: 6\' 6"  (198.1 cm) Weight: 132 kg (291 lb 0.1 oz) IBW/kg (Calculated) : 91.4 Heparin Dosing Weight:   Vital Signs: Temp: 98.7 F (37.1 C) (12/02 0428) Temp Source: Oral (12/02 0428) BP: 163/95 (12/02 0428) Pulse Rate: 88 (12/02 0428)  Labs: Recent Labs    09/18/21 0934 09/19/21 0506 09/20/21 0613  HGB 8.2*  --  9.1*  HCT 26.3*  --  29.1*  PLT 213  --  238  CREATININE 3.51* 2.86* 2.35*  2.33*    Estimated Creatinine Clearance: 46.2 mL/min (A) (by C-G formula based on SCr of 2.33 mg/dL (H)).    Assessment: Patient's a 22 M presented to the ED on 11/28 with c/o LE swelling and pain. LE doppler on 11/29 showed age indeterminate DVT of L femoral vein. He's currently on lovemox for VTE treatment.  Pharmacy has been consulted on 12/2 to transition patient to Eliquis.  - cbc stable - scr 2.35 (crcl~46) - last lovenox dose given to pt at 0934 on 09/19/21    Plan:  - d/c lovenox - start Eliquis 10mg  bid x7days (give first dose tonight at 10p), then 5mg  bid   Thomas [name] P 09/20/2021,2:35 PM

## 2021-09-20 NOTE — Consult Note (Signed)
  Subjective:  Patient ID: Thomas [name], male    DOB: [birth year],  MRN: [id number]  A 70 y.o. male  past medical history of diabetes mellitus type 2, essential hypertension, chronic kidney disease stage IV, chronic normocytic anemia with concern for left second digit osteomyelitis.  Patient had an MRI done which could not rule out osteomyelitis.  Podiatry was consulted to evaluate.  Patient states is doing well.  No pain.  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  He states that he does not recall any acute injury to the second toe.  He states he may have rubbed against the shoe.  He denies any other acute issues.  He has not seen anyone else prior to seeing me.  He does have a foot doctor that he saw a long time ago at [facility]. Objective:   Vitals:   09/19/21 2038 09/20/21 0428  BP: (!) 178/103 (!) 163/95  Pulse: 88 88  Resp: 19 18  Temp: 98.7 F (37.1 C) 98.7 F (37.1 C)  SpO2: 92% 92%   General AA&O x3. Normal mood and affect.  Vascular Dorsalis pedis and posterior tibial pulses 2/4 bilat. Brisk capillary refill to all digits. Pedal hair present.  Neurologic Epicritic sensation grossly intact.  Dermatologic Left second digit hammertoe contracture with dorsal scab formation.  The scab is hard indurated and intact.  No sign of purulent drainage noted.  No wound correlating to the underlying bone.  No signs of infection noted.  No malodor present.  Orthopedic: Hammertoe deformities of digits 2 through 5 noted.    Assessment & Plan:  Patient was evaluated and treated and all questions answered.  Left second digit left second digit hammertoe contracture with dorsal scab without underlying ulceration or clinical signs of infection -All questions and concerns were discussed with the patient in extensive detail -At this time I am not clinically concerned for any type of infection or osteomyelitis to the second digit. -Given that there is no wound correlating down to the  bone patient does not need any kind of surgical intervention/amputation at this time. -He would benefit from follow-up with the podiatrist in an outpatient setting.  I discussed this with the patient he states he has a podiatrist in [facility] and is willing to see [place] in [place]. -I discussed with him to make an appointment once he is released from the hospital to see a podiatrist.  He states understanding -No local wound care indicated -Discussed shoe gear modification with wider toe box -Patient is okay to be discharged from podiatric standpoint. Felipa Furnace, DPM  Accessible via secure chat for questions or concerns.

## 2021-09-20 NOTE — Care Management Important Message (Signed)
Important Message  Patient Details IM Letter given to the Patient. Name: Thomas [name] MRN: [id number] Date of Birth: 10/05/1951   Medicare Important Message Given:  Yes     Kerin Salen 09/20/2021, 10:59 AM

## 2021-09-21 ENCOUNTER — Inpatient Hospital Stay (HOSPITAL_COMMUNITY): Payer: Medicare Other

## 2021-09-21 DIAGNOSIS — N179 Acute kidney failure, unspecified: Secondary | ICD-10-CM | POA: Diagnosis not present

## 2021-09-21 LAB — GLUCOSE, CAPILLARY
Glucose-Capillary: 101 mg/dL — ABNORMAL HIGH (ref 70–99)
Glucose-Capillary: 108 mg/dL — ABNORMAL HIGH (ref 70–99)
Glucose-Capillary: 84 mg/dL (ref 70–99)
Glucose-Capillary: 85 mg/dL (ref 70–99)
Glucose-Capillary: 88 mg/dL (ref 70–99)
Glucose-Capillary: 97 mg/dL (ref 70–99)

## 2021-09-21 LAB — CREATININE, SERUM
Creatinine, Ser: 2.22 mg/dL — ABNORMAL HIGH (ref 0.61–1.24)
GFR, Estimated: 31 mL/min — ABNORMAL LOW (ref >60)

## 2021-09-21 LAB — MRSA NEXT GEN BY PCR, NASAL: MRSA by PCR Next Gen: DETECTED — AB

## 2021-09-21 IMAGING — DX DG ABD PORTABLE 1V
2 series · 2 of 2 positions shown · non-contrast
Comparison: Plain film of the abdomen dated [DATE].

CLINICAL DATA: Nausea and vomiting, abdominal pain.

EXAM:
PORTABLE ABDOMEN - 1 VIEW

[abdomen kub (1 of 2)]
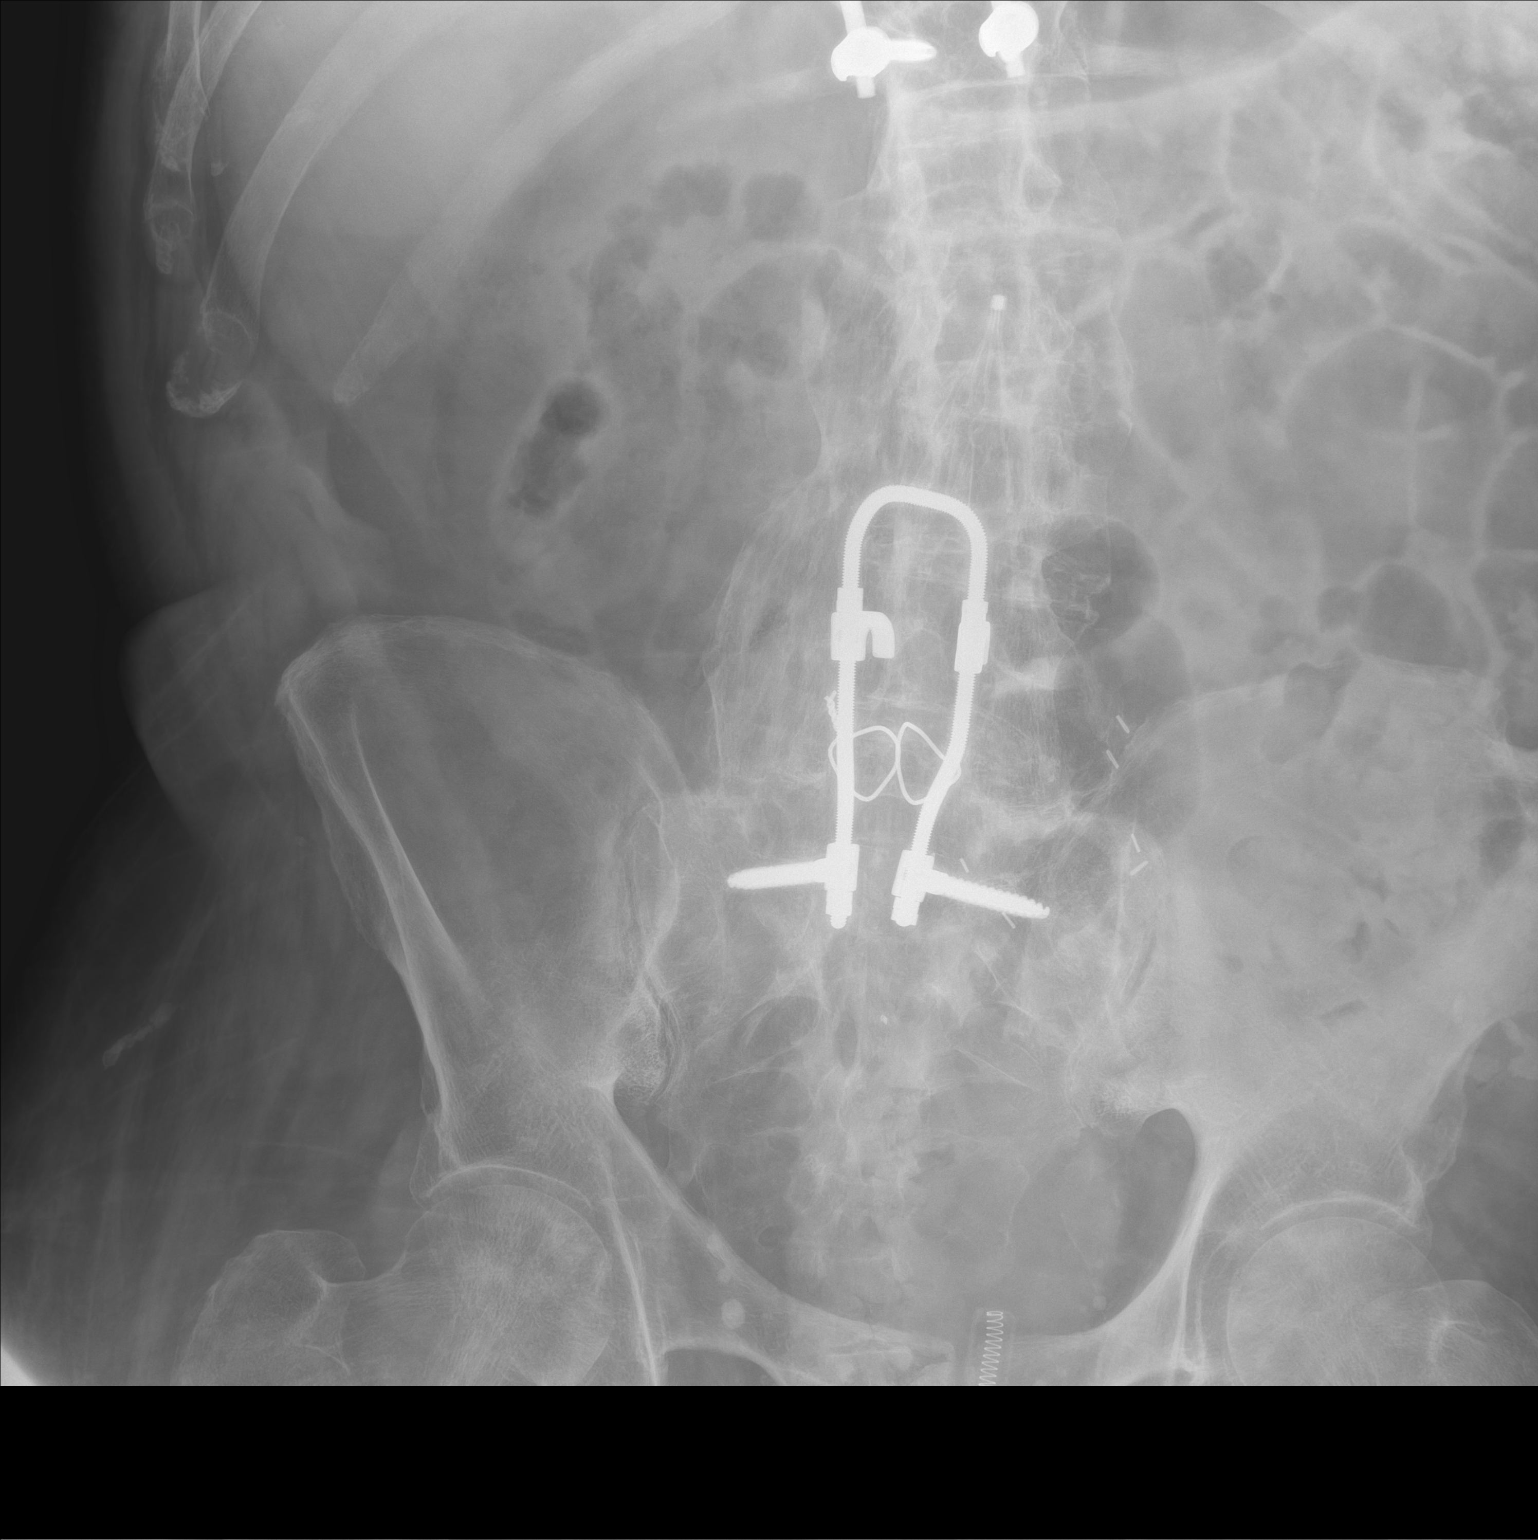

[abdomen kub (2 of 2)]
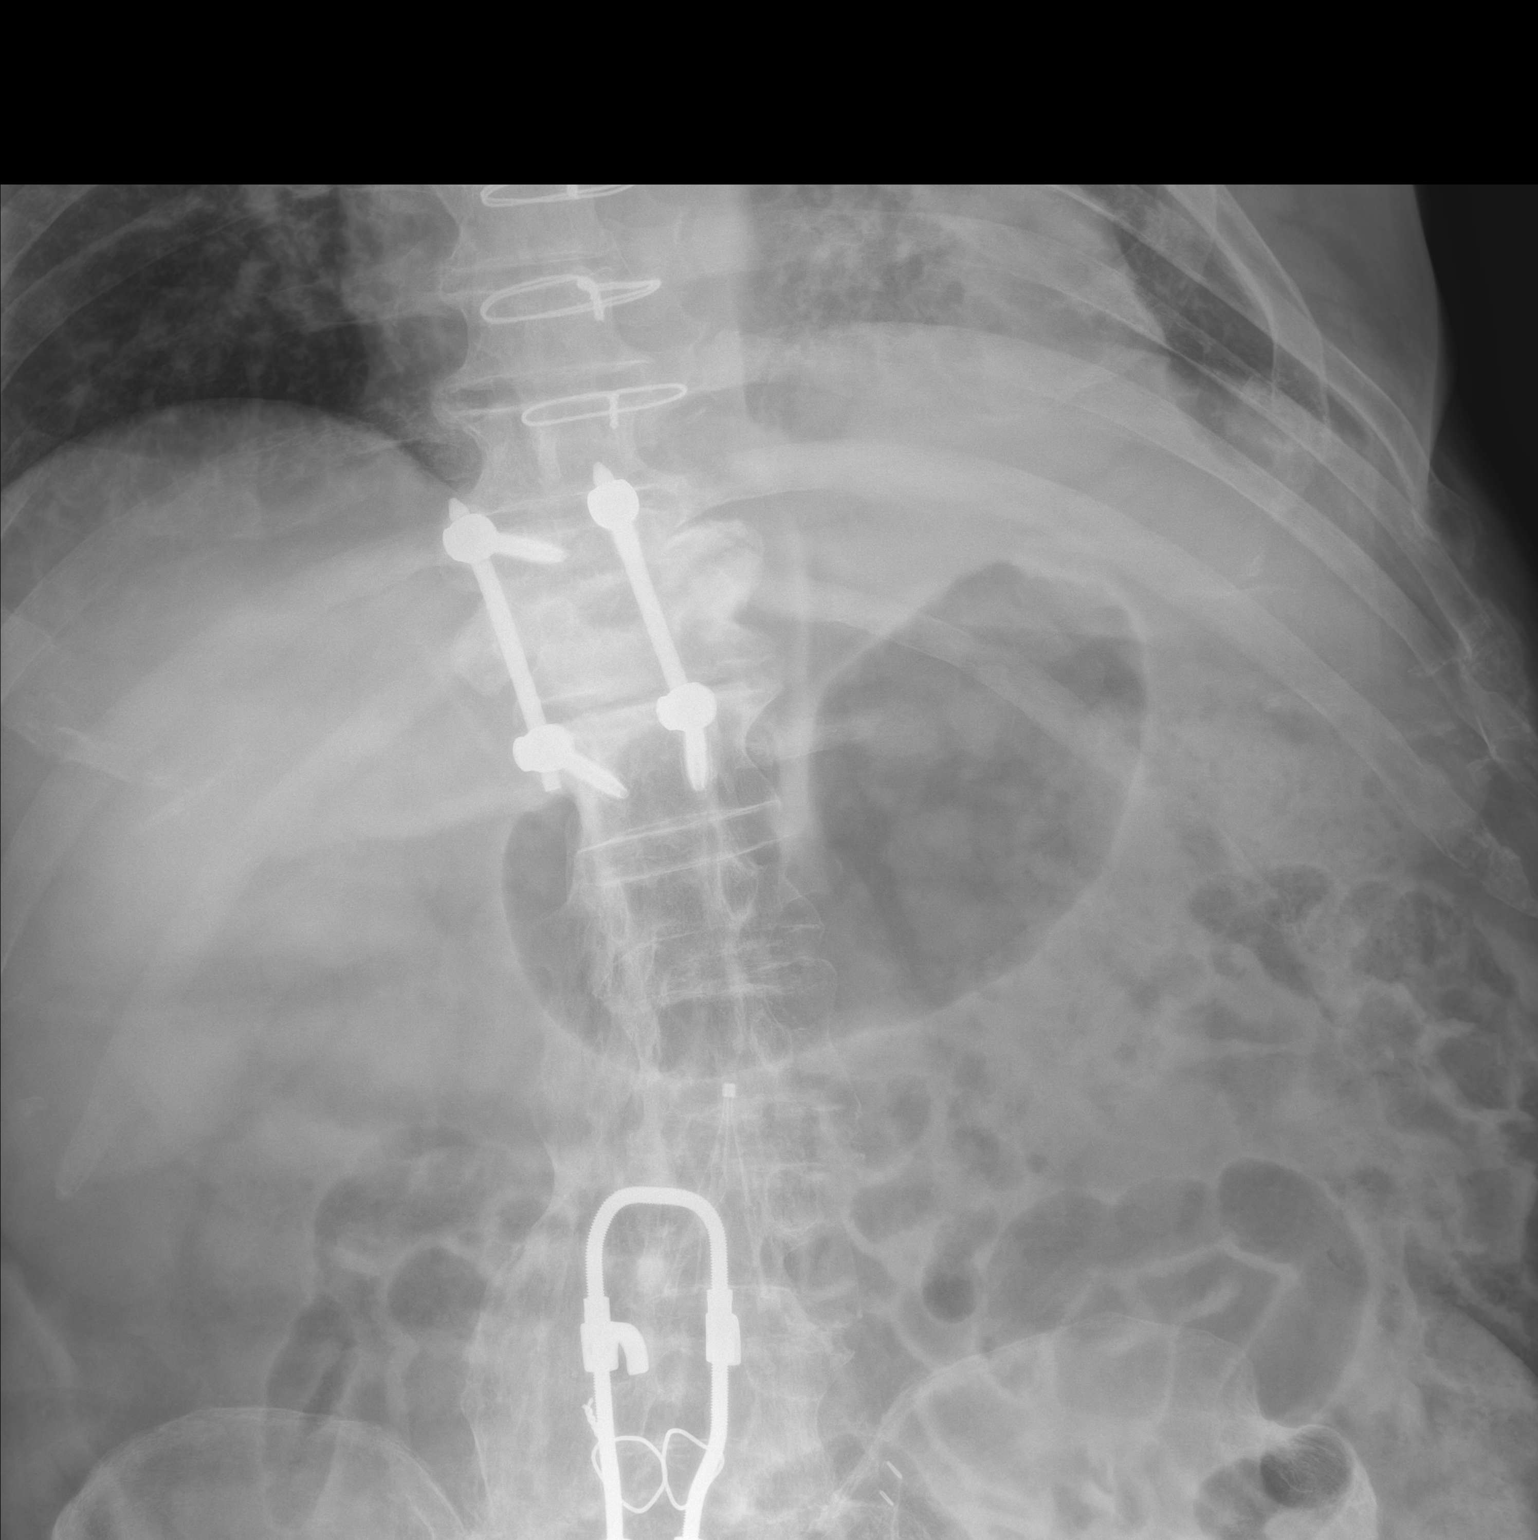

[2 of 2 positions shown; findings below may reference images not displayed]

FINDINGS: Visualized bowel gas pattern is nonobstructive. No evidence of soft
tissue mass or abnormal fluid collection. No evidence of free
intraperitoneal air.

There is a dense opacity at the LEFT lung base, similar to chest
x-ray of [DATE], suspected pneumonia.
IMPRESSION: 1. Nonobstructive bowel gas pattern and no evidence of acute
intra-abdominal abnormality.
2. Probable LEFT lower lobe pneumonia.

## 2021-09-21 MED ORDER — DOXYCYCLINE HYCLATE 100 MG PO TABS
100.0000 mg | ORAL_TABLET | Freq: Two times a day (BID) | ORAL | Status: DC
  Administered 2021-09-21: 100 mg via ORAL
  Filled 2021-09-21 (×2): qty 1

## 2021-09-21 MED ORDER — FERROUS SULFATE 325 (65 FE) MG PO TABS: 325.0000 mg | ORAL_TABLET | Freq: Every day | ORAL | 0 refills | Status: DC

## 2021-09-21 MED ORDER — PANTOPRAZOLE SODIUM 40 MG IV SOLR
40.0000 mg | INTRAVENOUS | Status: DC
  Administered 2021-09-21: 40 mg via INTRAVENOUS

## 2021-09-21 MED ORDER — CHLORHEXIDINE GLUCONATE CLOTH 2 % EX PADS
6.0000 | MEDICATED_PAD | Freq: Every day | CUTANEOUS | Status: DC
  Administered 2021-09-23 – 2021-09-26 (×4): 6 via TOPICAL

## 2021-09-21 MED ORDER — CEPHALEXIN 500 MG PO CAPS: 500.0000 mg | ORAL_CAPSULE | Freq: Three times a day (TID) | ORAL | 0 refills | Status: DC

## 2021-09-21 MED ORDER — CLONIDINE HCL 0.1 MG PO TABS: 0.1000 mg | ORAL_TABLET | Freq: Two times a day (BID) | ORAL | 0 refills | Status: DC

## 2021-09-21 MED ORDER — APIXABAN (ELIQUIS) VTE STARTER PACK (10MG AND 5MG): ORAL_TABLET | ORAL | 0 refills | Status: DC

## 2021-09-21 MED ORDER — AMLODIPINE BESYLATE 5 MG PO TABS: 5.0000 mg | ORAL_TABLET | Freq: Every day | ORAL | Status: DC

## 2021-09-21 MED ORDER — AMLODIPINE BESYLATE 5 MG PO TABS
5.0000 mg | ORAL_TABLET | Freq: Every day | ORAL | Status: DC
  Filled 2021-09-21 (×2): qty 1

## 2021-09-21 MED ORDER — FUROSEMIDE 40 MG PO TABS
40.0000 mg | ORAL_TABLET | Freq: Every day | ORAL | Status: DC
  Filled 2021-09-21: qty 1

## 2021-09-21 MED ORDER — METOPROLOL TARTRATE 5 MG/5ML IV SOLN
5.0000 mg | Freq: Four times a day (QID) | INTRAVENOUS | Status: DC | PRN
  Administered 2021-09-21 – 2021-09-22 (×4): 5 mg via INTRAVENOUS
  Filled 2021-09-21 (×4): qty 5

## 2021-09-21 MED ORDER — MUPIROCIN 2 % EX OINT
1.0000 "application " | TOPICAL_OINTMENT | Freq: Two times a day (BID) | CUTANEOUS | Status: DC
  Administered 2021-09-21 – 2021-09-26 (×9): 1 via NASAL
  Filled 2021-09-21 (×3): qty 22

## 2021-09-21 MED ORDER — FUROSEMIDE 40 MG PO TABS: 40.0000 mg | ORAL_TABLET | Freq: Every day | ORAL | Status: DC

## 2021-09-21 MED ORDER — SACCHAROMYCES BOULARDII 250 MG PO CAPS: 250.0000 mg | ORAL_CAPSULE | Freq: Two times a day (BID) | ORAL | 0 refills | Status: DC

## 2021-09-21 MED ORDER — METOPROLOL TARTRATE 100 MG PO TABS: 50.0000 mg | ORAL_TABLET | Freq: Two times a day (BID) | ORAL | 0 refills | Status: DC

## 2021-09-21 MED ORDER — DOXYCYCLINE HYCLATE 100 MG PO TABS: 100.0000 mg | ORAL_TABLET | Freq: Two times a day (BID) | ORAL | 0 refills | Status: DC

## 2021-09-21 MED ORDER — CLONIDINE HCL 0.1 MG PO TABS
0.1000 mg | ORAL_TABLET | Freq: Two times a day (BID) | ORAL | Status: DC
  Administered 2021-09-21: 0.1 mg via ORAL
  Filled 2021-09-21 (×2): qty 1

## 2021-09-21 MED ORDER — PANTOPRAZOLE SODIUM 40 MG IV SOLR
40.0000 mg | Freq: Two times a day (BID) | INTRAVENOUS | Status: DC
Start: 2021-09-21 — End: 2021-09-29
  Administered 2021-09-21 – 2021-09-29 (×15): 40 mg via INTRAVENOUS
  Filled 2021-09-21 (×15): qty 40

## 2021-09-21 MED ORDER — CEPHALEXIN 500 MG PO CAPS
500.0000 mg | ORAL_CAPSULE | Freq: Three times a day (TID) | ORAL | Status: DC
  Administered 2021-09-21: 500 mg via ORAL
  Filled 2021-09-21: qty 1

## 2021-09-21 MED ORDER — PANTOPRAZOLE SODIUM 40 MG PO TBEC: 40.0000 mg | DELAYED_RELEASE_TABLET | Freq: Every day | ORAL | Status: DC

## 2021-09-21 MED ORDER — METOPROLOL TARTRATE 50 MG PO TABS
50.0000 mg | ORAL_TABLET | Freq: Two times a day (BID) | ORAL | Status: DC
  Administered 2021-09-21: 50 mg via ORAL
  Filled 2021-09-21 (×2): qty 1

## 2021-09-21 MED ORDER — APIXABAN 5 MG PO TABS: 5.0000 mg | ORAL_TABLET | Freq: Two times a day (BID) | ORAL | 0 refills | Status: DC

## 2021-09-21 MED ORDER — DEXTROSE-NACL 5-0.9 % IV SOLN: INTRAVENOUS | Status: DC

## 2021-09-21 MED ORDER — METOPROLOL TARTRATE 50 MG PO TABS: 50.0000 mg | ORAL_TABLET | Freq: Two times a day (BID) | ORAL | Status: DC

## 2021-09-21 MED ORDER — SACCHAROMYCES BOULARDII 250 MG PO CAPS
250.0000 mg | ORAL_CAPSULE | Freq: Two times a day (BID) | ORAL | Status: DC
  Administered 2021-09-21 – 2021-09-23 (×2): 250 mg via ORAL
  Filled 2021-09-21 (×4): qty 1

## 2021-09-21 NOTE — Progress Notes (Signed)
OT Cancellation Note  Patient Details Name: Thomas [name] MRN: [id number] DOB: 1951-09-01   Cancelled Treatment:    Reason Eval/Treat Not Completed: Other (comment) Could not be aroused.   Thomas [name] 09/21/2021, 1:51 PM

## 2021-09-21 NOTE — Progress Notes (Signed)
Dr Nevada Crane called concerning patient gagging and vomiting when he tries to take his medication and say "he is sick on his stomach". New orders received. Will continue to monitor.

## 2021-09-21 NOTE — Progress Notes (Signed)
Patient received all medications and then instantly and violently spit them up all over himself. I am unsure which medications he was able to keep down and the medications he lost via vomiting. NP on call was made aware. Patient needs IV medications scheduled as much as possible.  He is unable to swallow oral medications. He also insists on keeping the bed down when he takes his medications, despite education regarding aspiration risks. He is irritable and non-compliant. Please review his med list to decrease some of the oral medications. Also, patient has redness surrounding both of his eyes. Conjunctivitis?Roderick Pee

## 2021-09-21 NOTE — Progress Notes (Signed)
[facility] PROGRESS NOTE    Progress Note  Thomas [name]  ERX:[id number] DOB: [birth year] DOA: 09/16/2021 PCP: Antonietta Jewel, MD     Brief Narrative:   Thomas [name] is an 70 y.o. male past medical history of diabetes mellitus type 2, essential hypertension, chronic kidney disease stage IV, chronic normocytic anemia, brought into the ED via EMS due to concern by his brother not looking well, not eating or taking his medications for several days.  Upon EMS arrival, his blood glucose was 57 was given D10.  Work-up revealed AKI on CKD 4, suspected prerenal secondary to dehydration.  Also revealed left lower lobe community-acquired pneumonia, left lower extremity cellulitis and age-indeterminate left lower extremity DVT.  MRI left foot could not rule out second digit osteomyelitis.  Started on empiric IV antibiotics and full dose subcu Lovenox twice daily.  Podiatry Dr. Posey Pronto consulted, will see in consultation. MRI thoracic spine 09/17/2021 no evidence for discitis.  Seen by podiatry Dr. Posey Pronto, plan to follow-up outpatient.  No evidence of infection or osteomyelitis to the second digits.  No local wound care indicated.  Okay to be discharged from podiatry standpoint.  We need to follow-up with podiatry outpatient.  09/21/2021: Patient was seen at bedside.  No new complaints when seen this morning however around noon endorses nausea with dry heaves.  Abdominal x-ray ordered to further assess. Assessment/Plan:   Resolved acute kidney injury on chronic kidney disease stage 3B: Likely prerenal secondary to dehydration, UA pyuria and proteinuria. Renal function is back to baseline 2.2 with GFR of 31. His home Lasix was held and he received gentle IV fluid hydration. Presented with creatinine of 4.62 Continue to avoid nephrotoxic agents and hypotension.  Community-acquired pneumonia/left lower lobe pneumonia, POA: Vital signs are stable, no longer requiring oxygen supplementation. Received  3 days of IV Rocephin and azithromycin. Switched to Keflex on 09/21/2021.  Doxycycline started on 09/21/2021 with positive MRSA screening.  Left lower extremity swelling concerning for cellulitis, resolving. Dopplers positive for age-indeterminate DVT left lower extremity. MRI of the left foot, osteomyelitis of second digit could not be completely ruled out, unlikely per podiatry.  Seen by podiatry Dr. Posey Pronto, plan to follow-up outpatient.  No evidence of infection or osteomyelitis to the second digits.  No local wound care indicated.  Okay to be discharged from podiatry standpoint.  Will need to follow-up with podiatry outpatient. IV vancomycin switched to IV linezolid on 09/19/2021.  Then switched to doxycycline on 09/21/2021.  Intractable nausea and vomiting, prior history of esophagitis, seen on EGD 11/25/2019 Bowel movement within the last 24 hours Obtain abdominal x-ray Start daily p.o. Protonix 40 mg IV antiemetics as needed Avoid dehydration  History of upper GI bleed with ground coffee emesis/LA grade A seen on EGD done on 11/25/2019 by Dr. Benson Norway  No PPI seen on his list of home medications Started PPI Hemoglobin stable 9.1 from 8.2. No overt bleeding  Left lower extremity age-indeterminate DVT, seen on Doppler ultrasound 09/17/2021 Initially on full dose subcu Lovenox twice daily Switched to Eliquis on 09/20/2021. Continue Eliquis  Low back pain with prior history of thoracic spine discitis:  MRI of the T-spine no recurrent osteomyelitis or diskitis seen.  Diabetes mellitus type 2 with hyperglycemia: Hemoglobin A1c 5.9 on 06/03/2021. Hemoglobin A1c 6.0 09/20/21 Avoid hypoglycemia  Essential hypertension: Currently on Norvasc 5 mg daily, clonidine 0.1 mg twice daily, p.o. Lasix 20 mg daily, Lopressor 50 mg twice daily Continue to closely monitor vital signs.  Anemia  of chronic renal disease/iron deficiency anemia: Iron deficiency noted on iron studies. Hemoglobin 8.2>>  9.1. Continue iron supplement  Chronic pain: Pain control and bowel regimen  Consult: Podiatry  Physical debility PT assessed and recommended SNF TOC assisting with SNF placement. Continue PT OT with assistance and fall precautions.  Obesity BMI 33 Recommend weight los outpatient with regular physical activity and healthy dieting.      ARF (acute renal failure) (HCC) Active Problems:   [name], right ([place])   [name], type 2) ([place])   HTN (hypertension)   BPH (benign prostatic hyperplasia)   Chronic pain syndrome   CAP (community acquired pneumonia)  RN Pressure Injury Documentation: Pressure Injury 06/03/21 Hip Anterior;Left Stage 1 -  Intact skin with non-blanchable redness of a localized area usually over a bony prominence. skin intact and has dark red bruising (Active)  06/03/21 0238  Location: Hip  Location Orientation: Anterior;Left  Staging: Stage 1 -  Intact skin with non-blanchable redness of a localized area usually over a bony prominence.  Wound Description (Comments): skin intact and has dark red bruising  Present on Admission: Yes     Pressure Injury 06/03/21 Penis Mid;Circumferential Stage 2 -  Partial thickness loss of dermis presenting as a shallow open injury with a red, pink wound bed without slough. swollen, red, and weeping with slight tear on top (Active)  06/03/21 0240  Location: Penis  Location Orientation: Mid;Circumferential  Staging: Stage 2 -  Partial thickness loss of dermis presenting as a shallow open injury with a red, pink wound bed without slough.  Wound Description (Comments): swollen, red, and weeping with slight tear on top  Present on Admission: Yes    Estimated body mass index is 33.63 kg/m as calculated from the following:   Height as of this encounter: 6\' 6"  (1.981 m).   Weight as of this encounter: 132 kg.    DVT prophylaxis: Eliquis Family Communication:none Status is: Inpatient.  Patient requires  at least 2 midnights for further evaluation and treatment of present condition.   Code Status:     Code Status Orders  (From admission, onward)           Start     Ordered   09/17/21 0347  Full code  Continuous        09/17/21 0349           Code Status History     Date Active Date Inactive Code Status Order ID Comments User Context   06/02/2021 2155 06/07/2021 0017 Full Code [id number]  Geradine Girt, DO ED   08/30/2020 0853 10/03/2020 2226 Full Code [id number]  Mosetta Anis, MD ED   11/24/2019 0932 12/17/2019 1907 Full Code [id number]  Karmen Bongo, MD Inpatient   08/31/2019 0300 10/09/2019 1947 Full Code [id number]  [name], MD Inpatient         IV Access:   Peripheral IV   Procedures and diagnostic studies:   No results found.   Medical Consultants:  Podiatry.    Objective:    Vitals:   09/20/21 1413 09/20/21 1444 09/20/21 2045 09/21/21 0435  BP:  (!) 181/94 (!) 145/86 (!) 161/104  Pulse:  73 81 90  Resp: 18 19 16 16   Temp:  98.1 F (36.7 C) 98.5 F (36.9 C) 99.5 F (37.5 C)  TempSrc:  Oral Oral Oral  SpO2:  94% 91% 95%  Weight:      Height:  SpO2: 95 % O2 Flow Rate (L/min): 1.5 L/min   Intake/Output Summary (Last 24 hours) at 09/21/2021 1251 Last data filed at 09/21/2021 0800 Gross per 24 hour  Intake 960 ml  Output 3325 ml  Net -2365 ml   Filed Weights   09/16/21 2200 09/18/21 0453  Weight: 130.2 kg 132 kg    Exam: General exam: Obese in no acute distress.  He is alert and oriented x3.   Respiratory system: Clear to auscultation no wheezes or rales.   Cardiovascular system: Regular rate and rhythm no rubs or gallops.   Gastrointestinal system: Obese with bowel sounds present.  Extremities: Right below the knee amputation. Skin: Resolving cellulitis in left lower extremity. Psychiatry: Mood is appropriate for condition and setting. Neuro: Nonfocal exam.  Data Reviewed:    Labs: Basic Metabolic  Panel: Recent Labs  Lab 09/16/21 2245 09/18/21 0934 09/19/21 0506 09/20/21 0613 09/21/21 0700  NA 135 140  --  138  --   K 4.3 4.7  --  4.9  --   CL 104 113*  --  109  --   CO2 20* 21*  --  22  --   GLUCOSE 86 106*  --  111*  --   BUN 73* 58*  --  30*  --   CREATININE 4.62* 3.51* 2.86* 2.35*  2.33* 2.22*  CALCIUM 7.3* 7.1*  --  7.5*  --   PHOS  --   --   --  3.8  --    GFR Estimated Creatinine Clearance: 48.5 mL/min (A) (by C-G formula based on SCr of 2.22 mg/dL (H)). Liver Function Tests: Recent Labs  Lab 09/16/21 2245 09/20/21 0613  AST 26  --   ALT 11  --   ALKPHOS 82  --   BILITOT 0.5  --   PROT 8.2*  --   ALBUMIN 3.1* 2.8*   No results for input(s): LIPASE, AMYLASE in the last 168 hours. No results for input(s): AMMONIA in the last 168 hours. Coagulation profile No results for input(s): INR, PROTIME in the last 168 hours. COVID-19 Labs  No results for input(s): DDIMER, FERRITIN, LDH, CRP in the last 72 hours.   Lab Results  Component Value Date   SARSCOV2NAA NEGATIVE 09/16/2021   SARSCOV2NAA NEGATIVE 06/06/2021   [place] NEGATIVE 06/02/2021   [place] NEGATIVE 10/05/2020    CBC: Recent Labs  Lab 09/16/21 2245 09/18/21 0934 09/20/21 0613  WBC 7.0 5.2 5.6  NEUTROABS 5.7  --   --   HGB 8.7* 8.2* 9.1*  HCT 28.2* 26.3* 29.1*  MCV 101.8* 100.4* 100.0  PLT 228 213 238   Cardiac Enzymes: No results for input(s): CKTOTAL, CKMB, CKMBINDEX, TROPONINI in the last 168 hours. BNP (last 3 results) No results for input(s): PROBNP in the last 8760 hours. CBG: Recent Labs  Lab 09/20/21 2047 09/21/21 0006 09/21/21 0436 09/21/21 0741 09/21/21 1145  GLUCAP 97 101* 88 84 85   D-Dimer: No results for input(s): DDIMER in the last 72 hours.  Hgb A1c: Recent Labs    09/20/21 0613  HGBA1C 6.0*   Lipid Profile: No results for input(s): CHOL, HDL, LDLCALC, TRIG, CHOLHDL, LDLDIRECT in the last 72 hours. Thyroid function studies: No results for  input(s): TSH, T4TOTAL, T3FREE, THYROIDAB in the last 72 hours.  Invalid input(s): FREET3 Anemia work up: No results for input(s): VITAMINB12, FOLATE, FERRITIN, TIBC, IRON, RETICCTPCT in the last 72 hours.  Sepsis Labs: Recent Labs  Lab 09/16/21 2245 09/18/21 0934 09/20/21 1660  PROCALCITON  --  0.12  --   WBC 7.0 5.2 5.6  LATICACIDVEN 0.8  --   --    Microbiology Recent Results (from the past 240 hour(s))  Blood culture (routine x 2)     Status: None (Preliminary result)   Collection Time: 09/16/21 10:45 PM   Specimen: BLOOD  Result Value Ref Range Status   Specimen Description   Final    BLOOD BLOOD RIGHT ARM Performed at [facility] [address], [place], [place] [address]    Special Requests   Final    BOTTLES DRAWN AEROBIC AND ANAEROBIC Blood Culture adequate volume Performed at [facility] [address] [place], [place] [address]    Culture   Final    NO GROWTH 4 DAYS Performed at [facility], [place] [address] [place], [place] [address]    Report Status PENDING  Incomplete  Resp Panel by RT-PCR (Flu A&B, Covid)     Status: None   Collection Time: 09/16/21 10:45 PM   Specimen: Nasopharyngeal(NP) swabs in vial transport medium  Result Value Ref Range Status   SARS Coronavirus 2 by RT PCR NEGATIVE NEGATIVE Final    Comment: (NOTE) SARS-CoV-2 target nucleic acids are NOT DETECTED.  The SARS-CoV-2 RNA is generally detectable in upper respiratory specimens during the acute phase of infection. The lowest concentration of SARS-CoV-2 viral copies this assay can detect is 138 copies/mL. A negative result does not preclude SARS-Cov-2 infection and should not be used as the sole basis for treatment or other patient management decisions. A negative result may occur with  improper specimen collection/handling, submission of specimen other than nasopharyngeal swab, presence of viral mutation(s) within the areas targeted by  this assay, and inadequate number of viral copies(<138 copies/mL). A negative result must be combined with clinical observations, patient history, and epidemiological information. The expected result is Negative.  Fact Sheet for Patients:  EntrepreneurPulse.com.au  Fact Sheet for Healthcare Providers:  IncredibleEmployment.be  This test is no t yet approved or cleared by the [place] FDA and  has been authorized for detection and/or diagnosis of SARS-CoV-2 by FDA under an Emergency Use Authorization (EUA). This EUA will remain  in effect (meaning this test can be used) for the duration of the COVID-19 declaration under Section 564(b)(1) of the Act, 21 U.S.C.section 360bbb-3(b)(1), unless the authorization is terminated  or revoked sooner.       Influenza A by PCR NEGATIVE NEGATIVE Final   Influenza B by PCR NEGATIVE NEGATIVE Final    Comment: (NOTE) The Xpert Xpress SARS-CoV-2/FLU/RSV plus assay is intended as an aid in the diagnosis of influenza from Nasopharyngeal swab specimens and should not be used as a sole basis for treatment. Nasal washings and aspirates are unacceptable for Xpert Xpress SARS-CoV-2/FLU/RSV testing.  Fact Sheet for Patients: EntrepreneurPulse.com.au  Fact Sheet for Healthcare Providers: IncredibleEmployment.be  This test is not yet approved or cleared by the [place] FDA and has been authorized for detection and/or diagnosis of SARS-CoV-2 by FDA under an Emergency Use Authorization (EUA). This EUA will remain in effect (meaning this test can be used) for the duration of the COVID-19 declaration under Section 564(b)(1) of the Act, 21 U.S.C. section 360bbb-3(b)(1), unless the authorization is terminated or revoked.  Performed at [facility], [place] [address], [place], [place] [address]   Urine Culture     Status: None   Collection Time: 09/18/21 12:25  PM   Specimen: Urine,  Clean Catch  Result Value Ref Range Status   Specimen Description   Final    URINE, CLEAN CATCH Performed at [facility], [place] [address] [place], [place] [address]    Special Requests   Final    NONE Performed at [facility], [place] [address] [place], [place] [address]    Culture   Final    NO GROWTH Performed at [facility], [place] [address] [place], [place] [address]    Report Status 09/19/2021 FINAL  Final  MRSA Next Gen by PCR, Nasal     Status: Abnormal   Collection Time: 09/21/21  6:16 AM   Specimen: Nasal Mucosa; Nasal Swab  Result Value Ref Range Status   MRSA by PCR Next Gen DETECTED (A) NOT DETECTED Final    Comment: RESULT CALLED TO, READ BACK BY AND VERIFIED WITH: HUGHUY, S. RN ON 09/21/2021 @ 0941 BY MECIAL J. (NOTE) The GeneXpert MRSA Assay (FDA approved for NASAL specimens only), is one component of a comprehensive MRSA colonization surveillance program. It is not intended to diagnose MRSA infection nor to guide or monitor treatment for MRSA infections. Test performance is not FDA approved in patients less than 16 years old. Performed at [facility], [place] [address], [place], [place] [address]      Medications:    allopurinol  100 mg Oral Daily   amLODipine  5 mg Oral Daily   apixaban  10 mg Oral BID   Followed by   Derrill Memo ON 09/27/2021] apixaban  5 mg Oral BID   atorvastatin  80 mg Oral Daily   bisacodyl  10 mg Rectal Once   cephALEXin  500 mg Oral Q8H   cloNIDine  0.1 mg Oral BID   colchicine  0.6 mg Oral Daily   doxycycline  100 mg Oral Q12H   ferrous sulfate  325 mg Oral Q breakfast   finasteride  5 mg Oral Daily   furosemide  40 mg Oral Daily   gabapentin  300 mg Oral TID   insulin aspart  0-9 Units Subcutaneous TID WC   levothyroxine  50 mcg Oral Q0600   mouth rinse  15 mL Mouth Rinse BID   metoprolol tartrate  50 mg Oral BID   naloxegol oxalate  12.5 mg  Oral Daily   oxyCODONE  30 mg Oral Q12H   saccharomyces boulardii  250 mg Oral BID   senna-docusate  2 tablet Oral BID   simethicone  80 mg Oral QID   tamsulosin  0.4 mg Oral Daily   vitamin B-12  200 mcg Oral Daily   Continuous Infusions:  promethazine (PHENERGAN) injection (IM or IVPB) Stopped (09/20/21 1438)      LOS: 4 days   Kayleen Memos  [facility]  09/21/2021, 12:51 PM

## 2021-09-21 NOTE — Progress Notes (Signed)
Dr Nevada Crane called concerning patients blood pressure and the fact that he is refusing to eat and drink today due to nausea. New orders received, will continue to monitor.

## 2021-09-22 ENCOUNTER — Encounter (HOSPITAL_COMMUNITY): Payer: Self-pay | Admitting: Certified Registered Nurse Anesthetist

## 2021-09-22 DIAGNOSIS — N179 Acute kidney failure, unspecified: Secondary | ICD-10-CM | POA: Diagnosis not present

## 2021-09-22 LAB — COMPREHENSIVE METABOLIC PANEL
ALT: 17 U/L (ref 0–44)
AST: 42 U/L — ABNORMAL HIGH (ref 15–41)
Albumin: 3 g/dL — ABNORMAL LOW (ref 3.5–5.0)
Alkaline Phosphatase: 88 U/L (ref 38–126)
Anion gap: 6 (ref 5–15)
BUN: 24 mg/dL — ABNORMAL HIGH (ref 8–23)
CO2: 25 mmol/L (ref 22–32)
Calcium: 7.5 mg/dL — ABNORMAL LOW (ref 8.9–10.3)
Chloride: 109 mmol/L (ref 98–111)
Creatinine, Ser: 2.43 mg/dL — ABNORMAL HIGH (ref 0.61–1.24)
GFR, Estimated: 28 mL/min — ABNORMAL LOW (ref >60)
Glucose, Bld: 109 mg/dL — ABNORMAL HIGH (ref 70–99)
Potassium: 4 mmol/L (ref 3.5–5.1)
Sodium: 140 mmol/L (ref 135–145)
Total Bilirubin: 0.6 mg/dL (ref 0.3–1.2)
Total Protein: 7.1 g/dL (ref 6.5–8.1)

## 2021-09-22 LAB — CBC WITH DIFFERENTIAL/PLATELET
Abs Immature Granulocytes: 0.07 10*3/uL (ref 0.00–0.07)
Basophils Absolute: 0 10*3/uL (ref 0.0–0.1)
Basophils Relative: 0 %
Eosinophils Absolute: 0.2 10*3/uL (ref 0.0–0.5)
Eosinophils Relative: 2 %
HCT: 31.6 % — ABNORMAL LOW (ref 39.0–52.0)
Hemoglobin: 10.3 g/dL — ABNORMAL LOW (ref 13.0–17.0)
Immature Granulocytes: 1 %
Lymphocytes Relative: 12 %
Lymphs Abs: 0.9 10*3/uL (ref 0.7–4.0)
MCH: 31.4 pg (ref 26.0–34.0)
MCHC: 32.6 g/dL (ref 30.0–36.0)
MCV: 96.3 fL (ref 80.0–100.0)
Monocytes Absolute: 0.5 10*3/uL (ref 0.1–1.0)
Monocytes Relative: 7 %
Neutro Abs: 5.8 10*3/uL (ref 1.7–7.7)
Neutrophils Relative %: 78 %
Platelets: 266 10*3/uL (ref 150–400)
RBC: 3.28 MIL/uL — ABNORMAL LOW (ref 4.22–5.81)
RDW: 14 % (ref 11.5–15.5)
WBC: 7.4 10*3/uL (ref 4.0–10.5)
nRBC: 0 % (ref 0.0–0.2)

## 2021-09-22 LAB — HEPARIN LEVEL (UNFRACTIONATED): Heparin Unfractionated: >1.1 IU/mL — ABNORMAL HIGH (ref 0.30–0.70)

## 2021-09-22 LAB — GLUCOSE, CAPILLARY
Glucose-Capillary: 102 mg/dL — ABNORMAL HIGH (ref 70–99)
Glucose-Capillary: 108 mg/dL — ABNORMAL HIGH (ref 70–99)
Glucose-Capillary: 110 mg/dL — ABNORMAL HIGH (ref 70–99)
Glucose-Capillary: 119 mg/dL — ABNORMAL HIGH (ref 70–99)
Glucose-Capillary: 145 mg/dL — ABNORMAL HIGH (ref 70–99)
Glucose-Capillary: 98 mg/dL (ref 70–99)

## 2021-09-22 LAB — CULTURE, BLOOD (ROUTINE X 2)
Culture: NO GROWTH
Special Requests: ADEQUATE

## 2021-09-22 LAB — LIPASE, BLOOD: Lipase: 32 U/L (ref 11–51)

## 2021-09-22 LAB — MAGNESIUM: Magnesium: 1.4 mg/dL — ABNORMAL LOW (ref 1.7–2.4)

## 2021-09-22 LAB — PROCALCITONIN: Procalcitonin: 0.1 ng/mL

## 2021-09-22 LAB — APTT: aPTT: 58 seconds — ABNORMAL HIGH (ref 24–36)

## 2021-09-22 LAB — PHOSPHORUS: Phosphorus: 3.3 mg/dL (ref 2.5–4.6)

## 2021-09-22 MED ORDER — DEXTROSE-NACL 5-0.9 % IV SOLN: INTRAVENOUS | Status: DC

## 2021-09-22 MED ORDER — LEVOTHYROXINE SODIUM 100 MCG/5ML IV SOLN
25.0000 ug | Freq: Every day | INTRAVENOUS | Status: DC
  Administered 2021-09-22 – 2021-09-23 (×2): 25 ug via INTRAVENOUS
  Filled 2021-09-22 (×2): qty 5

## 2021-09-22 MED ORDER — METHOCARBAMOL 1000 MG/10ML IJ SOLN
500.0000 mg | Freq: Three times a day (TID) | INTRAVENOUS | Status: DC | PRN
  Administered 2021-09-22 – 2021-09-24 (×2): 500 mg via INTRAVENOUS
  Filled 2021-09-22 (×2): qty 500
  Filled 2021-09-22: qty 5

## 2021-09-22 MED ORDER — HYDRALAZINE HCL 20 MG/ML IJ SOLN
5.0000 mg | Freq: Four times a day (QID) | INTRAMUSCULAR | Status: DC | PRN
  Administered 2021-09-22 – 2021-09-23 (×2): 5 mg via INTRAVENOUS
  Filled 2021-09-22 (×2): qty 1

## 2021-09-22 MED ORDER — METOPROLOL TARTRATE 5 MG/5ML IV SOLN
5.0000 mg | Freq: Two times a day (BID) | INTRAVENOUS | Status: DC
  Filled 2021-09-22: qty 5

## 2021-09-22 MED ORDER — METOPROLOL TARTRATE 5 MG/5ML IV SOLN: 5.0000 mg | Freq: Three times a day (TID) | INTRAVENOUS | Status: DC

## 2021-09-22 MED ORDER — HEPARIN (PORCINE) 25000 UT/250ML-% IV SOLN
2000.0000 [IU]/h | INTRAVENOUS | Status: AC
  Administered 2021-09-22: 12:00:00 1800 [IU]/h via INTRAVENOUS
  Administered 2021-09-23: 2000 [IU]/h via INTRAVENOUS
  Filled 2021-09-22 (×2): qty 250

## 2021-09-22 MED ORDER — CLONIDINE HCL 0.2 MG/24HR TD PTWK
0.2000 mg | MEDICATED_PATCH | TRANSDERMAL | Status: DC
  Administered 2021-09-22: 12:00:00 0.2 mg via TRANSDERMAL
  Filled 2021-09-22: qty 1

## 2021-09-22 MED ORDER — NAPHAZOLINE-PHENIRAMINE 0.025-0.3 % OP SOLN
2.0000 [drp] | Freq: Three times a day (TID) | OPHTHALMIC | Status: DC
  Administered 2021-09-23 – 2021-09-24 (×4): 2 [drp] via OPHTHALMIC
  Filled 2021-09-22 (×2): qty 15

## 2021-09-22 NOTE — Consult Note (Signed)
[facility] Consult  Referring Provider: [facility] Primary Care Physician:  Antonietta Jewel, MD Primary Gastroenterologist: Althia Forts  Reason for Consultation: Nausea and vomiting  HPI: Thomas [name] is a 70 y.o. male admitted on 09/16/2021 because of his brother who thought he was not looking well, not eating and not taking his medications for several days. On admission he was found to have acute kidney injury, left lower lobe community-acquired pneumonia, left lower leg extremity cellulitis, age indeterminant left lower extremity DVT. We were consulted because of nausea and vomiting.  It is impossible to get any elaboration of history from the patient. He keeps saying I came to the hospital because I do not feel well. I had to get the patient's nurse, Wyatt Portela, RN, in the room to help. He kept covering his face with a blanket, groaning and refusing to provide any further history. He knows he is in the hospital, however thinks the month is February and was not able to tell me the year. I tried calling his brother Thomas [name], [phone], however was only able to leave a voice message.    Past Medical History:  Diagnosis Date   Arthritis    BPH (benign prostatic hyperplasia) 11/24/2019   Chronic pain syndrome 11/24/2019   CKD (chronic kidney disease), stage IV ([place]) 11/24/2019   Diabetes mellitus without complication (HCC)    Gout    Hypertension    Muscle pain    Physical deconditioning 11/24/2019   Scars    Swelling     Past Surgical History:  Procedure Laterality Date   BELOW KNEE LEG AMPUTATION Right    ESOPHAGOGASTRODUODENOSCOPY (EGD) WITH PROPOFOL N/A 11/25/2019   Procedure: ESOPHAGOGASTRODUODENOSCOPY (EGD) WITH PROPOFOL;  Surgeon: Carol Ada, MD;  Location: [facility];  Service: Endoscopy;  Laterality: N/A;   IR FLUORO GUIDE CV LINE RIGHT  09/05/2019   IR LUMBAR DISC ASPIRATION W/IMG GUIDE  11/26/2019   IR US GUIDE VASC ACCESS RIGHT  09/05/2019    RADIOLOGY WITH ANESTHESIA N/A 11/25/2019   Procedure: Thoracic and Lumbar MRI WITH ANESTHESIA;  Surgeon: Radiologist, Medication, MD;  Location: [place];  Service: Radiology;  Laterality: N/A;    Prior to Admission medications   Medication Sig Start Date End Date Taking? Authorizing Provider  acetaminophen (TYLENOL) 500 MG tablet Take 2 tablets (1,000 mg total) by mouth every 8 (eight) hours. Patient taking differently: Take 1,000 mg by mouth every 8 (eight) hours as needed for moderate pain. 10/03/20  Yes Jeralyn Bennett, MD  allopurinol (ZYLOPRIM) 100 MG tablet Take 100 mg by mouth daily.    Yes [provider]  amLODipine (NORVASC) 5 MG tablet Take 5 tablets by mouth daily.   Yes [provider]  apixaban (ELIQUIS) 5 MG TABS tablet Take 1 tablet (5 mg total) by mouth 2 (two) times daily. 10/22/21 12/21/21 Yes Hall, Lorenda Cahill, DO  APIXABAN Arne [place]) VTE STARTER PACK (10MG  AND 5MG ) Take as directed on package: start with two-5mg  tablets twice daily for 7 days. On day 8, switch to one-5mg  tablet twice daily. 09/21/21  Yes Irene Pap N, DO  atorvastatin (LIPITOR) 80 MG tablet Take 1 tablet (80 mg total) by mouth daily. 09/25/20  Yes Jose Persia, MD  colchicine 0.6 MG tablet Take 0.6 mg by mouth daily. 08/21/21  Yes [provider]  finasteride (PROSCAR) 5 MG tablet Take 1 tablet (5 mg total) by mouth daily. 10/10/19  Yes Hosie Poisson, MD  glipiZIDE (GLUCOTROL) 10 MG tablet Take 10 mg by mouth  daily before breakfast.   Yes [provider]  hydrALAZINE (APRESOLINE) 100 MG tablet Take 1 tablet (100 mg total) by mouth 3 (three) times daily. 09/24/20  Yes Jose Persia, MD  lactulose (CHRONULAC) 10 GM/15ML solution Take 15 mLs (10 g total) by mouth 2 (two) times daily. 09/24/20  Yes Jose Persia, MD  levofloxacin (LEVAQUIN) 500 MG tablet Take 500 mg by mouth. Every 48 hours 09/13/21  Yes [provider]  levothyroxine (SYNTHROID) 50 MCG tablet Take 50 mcg by  mouth daily before breakfast.    Yes [provider]  methocarbamol (ROBAXIN) 750 MG tablet Take 1 tablet (750 mg total) by mouth every 8 (eight) hours as needed for muscle spasms. 06/06/21  Yes Nita Sells, MD  naloxegol oxalate (MOVANTIK) 25 MG TABS tablet Take 1 tablet (25 mg total) by mouth daily. 09/25/20  Yes Jose Persia, MD  OXYCONTIN 30 MG 12 hr tablet Take 1 tablet (30 mg total) by mouth 2 (two) times daily. Patient taking differently: Take 30 mg by mouth every 8 (eight) hours. 06/06/21  Yes Nita Sells, MD  tamsulosin (FLOMAX) 0.4 MG CAPS capsule Take 1 capsule (0.4 mg total) by mouth daily. 09/25/20  Yes Jose Persia, MD  vitamin B-12 (CYANOCOBALAMIN) 250 MCG tablet Take 250 mcg by mouth daily. 05/12/21  Yes [provider]  cephALEXin (KEFLEX) 500 MG capsule Take 1 capsule (500 mg total) by mouth every 8 (eight) hours for 5 days. 09/21/21 09/26/21  Kayleen Memos, DO  cloNIDine (CATAPRES) 0.1 MG tablet Take 1 tablet (0.1 mg total) by mouth 2 (two) times daily. 09/21/21 10/21/21  Kayleen Memos, DO  clotrimazole (LOTRIMIN) 1 % cream Apply topically 2 (two) times daily. Patient not taking: Reported on 09/17/2021 06/06/21   Nita Sells, MD  doxycycline (VIBRA-TABS) 100 MG tablet Take 1 tablet (100 mg total) by mouth every 12 (twelve) hours for 5 days. 09/21/21 09/26/21  Kayleen Memos, DO  ferrous sulfate 325 (65 FE) MG tablet Take 1 tablet (325 mg total) by mouth daily with breakfast. 09/21/21 12/20/21  Kayleen Memos, DO  furosemide (LASIX) 40 MG tablet Take 40 mg by mouth daily. 09/03/21   [provider]  gabapentin (NEURONTIN) 300 MG capsule Take 1 capsule (300 mg total) by mouth at bedtime. Patient taking differently: Take 300 mg by mouth every 8 (eight) hours. 06/06/21   Nita Sells, MD  metoprolol tartrate (LOPRESSOR) 100 MG tablet Take 0.5 tablets (50 mg total) by mouth 2 (two) times daily. 09/21/21 10/21/21  Kayleen Memos, DO   saccharomyces boulardii (FLORASTOR) 250 MG capsule Take 1 capsule (250 mg total) by mouth 2 (two) times daily for 9 days. 09/21/21 09/30/21  Kayleen Memos, DO  ZOFRAN 4 MG tablet Take 4 mg by mouth every 8 (eight) hours as needed for nausea or vomiting.  Patient not taking: Reported on 09/17/2021 02/06/20   [provider]    Current Facility-Administered Medications  Medication Dose Route Frequency Provider Last Rate Last Admin   acetaminophen (TYLENOL) tablet 650 mg  650 mg Oral Q6H PRN [name], MD   650 mg at 09/17/21 1959   Or   acetaminophen (TYLENOL) suppository 650 mg  650 mg Rectal Q6H PRN [name], MD       allopurinol (ZYLOPRIM) tablet 100 mg  100 mg Oral Daily Hall, Carole N, DO       amLODipine (NORVASC) tablet 5 mg  5 mg Oral Daily Kayleen Memos,  DO       atorvastatin (LIPITOR) tablet 80 mg  80 mg Oral Daily [name], MD   80 mg at 09/20/21 0932   bisacodyl (DULCOLAX) suppository 10 mg  10 mg Rectal Once Kayleen Memos, DO       Chlorhexidine Gluconate Cloth 2 % PADS 6 each  6 each Topical Q0600 Irene Pap N, DO       cloNIDine (CATAPRES - Dosed in mg/24 hr) patch 0.2 mg  0.2 mg Transdermal Weekly Hall, Carole N, DO       colchicine tablet 0.6 mg  0.6 mg Oral Daily Hall, Carole N, DO       dextrose 5 %-0.9 % sodium chloride infusion   Intravenous Continuous Hall, Carole N, DO       ferrous sulfate tablet 325 mg  325 mg Oral Q breakfast Kayleen Memos, DO   325 mg at 09/20/21 0940   finasteride (PROSCAR) tablet 5 mg  5 mg Oral Daily [name], MD   5 mg at 09/20/21 7371   gabapentin (NEURONTIN) capsule 300 mg  300 mg Oral TID Kayleen Memos, DO   300 mg at 09/21/21 2237   hydrALAZINE (APRESOLINE) injection 5 mg  5 mg Intravenous Q6H PRN Irene Pap N, DO       HYDROmorphone (DILAUDID) injection 1 mg  1 mg Intravenous Q4H PRN Irene Pap N, DO   1 mg at 09/20/21 1413   insulin aspart (novoLOG) injection 0-9 Units  0-9  Units Subcutaneous TID WC [name], MD   1 Units at 09/19/21 1826   levothyroxine (SYNTHROID, LEVOTHROID) injection 25 mcg  25 mcg Intravenous Daily Irene Pap N, DO       MEDLINE mouth rinse  15 mL Mouth Rinse BID Charlynne Cousins, MD   15 mL at 09/21/21 2239   methocarbamol (ROBAXIN) 500 mg in dextrose 5 % 50 mL IVPB  500 mg Intravenous Q8H PRN Irene Pap N, DO       metoprolol tartrate (LOPRESSOR) injection 5 mg  5 mg Intravenous Q6H PRN Irene Pap N, DO   5 mg at 09/22/21 0711   metoprolol tartrate (LOPRESSOR) injection 5 mg  5 mg Intravenous Q12H Hall, Carole N, DO       mupirocin ointment (BACTROBAN) 2 % 1 application  1 application Nasal BID Irene Pap N, DO   1 application at 04/14/93 2239   naloxegol oxalate (MOVANTIK) tablet 12.5 mg  12.5 mg Oral Daily Leodis Sias T, RPH   12.5 mg at 09/20/21 0935   naphazoline-pheniramine (NAPHCON-A) 0.025-0.3 % ophthalmic solution 2 drop  2 drop Both Eyes TID Irene Pap N, DO       ondansetron [facility]) injection 4 mg  4 mg Intravenous Q6H PRN Irene Pap N, DO   4 mg at 09/21/21 1244   oxyCODONE (OXYCONTIN) 12 hr tablet 30 mg  30 mg Oral Q12H Hall, Carole N, DO   30 mg at 09/21/21 2236   pantoprazole (PROTONIX) injection 40 mg  40 mg Intravenous Q12H Irene Pap N, DO   40 mg at 09/21/21 2239   promethazine (PHENERGAN) 12.5 mg in sodium chloride 0.9 % 50 mL IVPB  12.5 mg Intravenous Q6H PRN Irene Pap N, DO   Stopped at 09/20/21 1438   saccharomyces boulardii (FLORASTOR) capsule 250 mg  250 mg Oral BID Irene Pap N, DO   250 mg at 09/21/21 2238   senna-docusate (Senokot-S) tablet 2 tablet  2 tablet Oral BID Kayleen Memos, DO   2 tablet at 09/20/21 2133   simethicone (MYLICON) 40 BM/8.4XL suspension 80 mg  80 mg Oral QID Kayleen Memos, DO   80 mg at 09/20/21 1411   tamsulosin (FLOMAX) capsule 0.4 mg  0.4 mg Oral Daily [name], MD   0.4 mg at 09/20/21 0940   vitamin B-12 (CYANOCOBALAMIN) tablet 200 mcg  200  mcg Oral Daily [name], MD   200 mcg at 09/20/21 2440    Allergies as of 09/16/2021 - Review Complete 09/16/2021  Allergen Reaction Noted   Other Other (See Comments) 01/30/2016   Oxymorphone Other (See Comments) 01/30/2016   Aspirin Nausea And Vomiting 08/31/2019   Beta vulgaris Nausea And Vomiting 01/30/2016   Buspirone Other (See Comments) 01/30/2016   Cabbage Nausea And Vomiting 01/30/2016   Codeine Other (See Comments) 10/18/2013   Dolobid [diflunisal]  01/23/2020   Fish allergy Nausea And Vomiting 01/30/2016   Fish-derived products Nausea And Vomiting 01/30/2016   Methadone  01/23/2020   Pentazocine Other (See Comments) 01/30/2016   Propoxyphene Other (See Comments) 01/30/2016   Shellfish allergy Nausea And Vomiting 01/30/2016   Sulfa antibiotics Hives 10/18/2013   Sulfasalazine Other (See Comments) 10/18/2013   Vistaril [hydroxyzine]  01/23/2020   Amoxicillin Rash 08/31/2019    Family History  Family history unknown: Yes    Social History   Socioeconomic History   Marital status: Single    Spouse name: Not on file   Number of children: Not on file   Years of education: Not on file   Highest education level: Not on file  Occupational History   Not on file  Tobacco Use   Smoking status: Never    Passive exposure: Never   Smokeless tobacco: Never  Vaping Use   Vaping Use: Never used  Substance and Sexual Activity   Alcohol use: No   Drug use: No   Sexual activity: Not Currently  Other Topics Concern   Not on file  Social History Narrative   Not on file   Social Determinants of Health   Financial Resource Strain: Not on file  Food Insecurity: Not on file  Transportation Needs: Not on file  Physical Activity: Not on file  Stress: Not on file  Social Connections: Not on file  Intimate Partner Violence: Not on file    Review of Systems: Unable to obtain  Physical Exam: Vital signs in last 24 hours: Temp:  [98.2 F (36.8 C)-99.8 F  (37.7 C)] 99.1 F (37.3 C) (12/04 0411) Pulse Rate:  [75-97] 78 (12/04 0411) Resp:  [16-20] 16 (12/04 0411) BP: (168-184)/(89-107) 175/99 (12/04 0711) SpO2:  [92 %-95 %] 95 % (12/04 0411) Weight:  [121.3 kg] 121.3 kg (12/04 0420) Last BM Date: 09/21/21  General:   Alert,  Well-developed, well-nourished, and not cooperative  head:  Normocephalic and atraumatic. Eyes:  Sclera clear, no icterus.   Prominent pallor  ears:  Normal auditory acuity. Nose:  No deformity, discharge,  or lesions. Mouth:  No deformity or lesions.  Oropharynx pink & moist. Neck:  Supple; no masses or thyromegaly. Lungs:  Clear throughout to auscultation.   No wheezes, crackles, or rhonchi. No acute distress. Heart:  Regular rate and rhythm; no murmurs, clicks, rubs,  or gallops. Extremities: Right below-knee amputation Neurologic:  Alert and  oriented x1;  grossly normal neurologically. Skin:  Intact without significant lesions or rashes. Psych:  Alert and cooperative. Normal mood and affect.  Abdomen:  Soft, left lower quadrant tenderness and nondistended. No masses, hepatosplenomegaly or hernias noted. Normal bowel sounds, without guarding, and without rebound.         Lab Results: Recent Labs    09/20/21 0613 09/22/21 0957  WBC 5.6 7.4  HGB 9.1* 10.3*  HCT 29.1* 31.6*  PLT 238 266   BMET Recent Labs    09/20/21 0613 09/21/21 0700 09/22/21 0957  NA 138  --  140  K 4.9  --  4.0  CL 109  --  109  CO2 22  --  25  GLUCOSE 111*  --  109*  BUN 30*  --  24*  CREATININE 2.35*  2.33* 2.22* 2.43*  CALCIUM 7.5*  --  7.5*   LFT Recent Labs    09/22/21 0957  PROT 7.1  ALBUMIN 3.0*  AST 42*  ALT 17  ALKPHOS 88  BILITOT 0.6   PT/INR No results for input(s): LABPROT, INR in the last 72 hours.  Studies/Results: DG Abd Portable 1V  Result Date: 09/21/2021 CLINICAL DATA:  Nausea and vomiting, abdominal pain. EXAM: PORTABLE ABDOMEN - 1 VIEW COMPARISON:  Plain film of the abdomen dated  09/15/2020. FINDINGS: Visualized bowel gas pattern is nonobstructive. No evidence of soft tissue mass or abnormal fluid collection. No evidence of free intraperitoneal air. There is a dense opacity at the LEFT lung base, similar to chest x-ray of 09/16/2021, suspected pneumonia. IMPRESSION: 1. Nonobstructive bowel gas pattern and no evidence of acute intra-abdominal abnormality. 2. Probable LEFT lower lobe pneumonia. Electronically Signed   By: Franki Cabot M.D.   On: 09/21/2021 14:22    Impression: Nausea, vomiting of unclear etiology-nonobstructive bowel gas pattern on abdominal x-ray History of esophagitis Normocytic anemia Iron deficiency, iron saturation 9% but low TIBC compatible with anemia of chronic disease Acute kidney injury on chronic kidney disease  Plan: Nausea and vomiting less likely related to obstruction given normal abdominal x-ray. However patient has not been able to eat or drink or take his medication because of ongoing nausea and vomiting. He would likely benefit from endoscopy. It is not possible to get a consent from the patient, due to his refusal to talk and perhaps because orientation is not appropriate. I tried reaching out to his brother but was only able to leave a voice message. Okay to keep the patient on clear liquid diet, n.p.o. post midnight for possible EGD in a.m. if consent can be obtained from patient's brother. Recommend holding Lovenox as well as Eliquis(last dose 09/21/2021 as per documentation), okay to start heparin if needed but heparin needs to be on hold from 8 AM tomorrow. Continue Protonix IV twice daily for now.   LOS: 5 days   Ronnette Juniper, MD  09/22/2021, 10:47 AM

## 2021-09-22 NOTE — Progress Notes (Signed)
[facility] PROGRESS NOTE    Progress Note  Thomas [name]  LZJ:[id number] DOB: 11-May-1951 DOA: 09/16/2021 PCP: Antonietta Jewel, MD     Brief Narrative:   Thomas [name] is an 70 y.o. male past medical history of diabetes mellitus type 2, essential hypertension, chronic kidney disease stage IV, chronic normocytic anemia, brought into the ED via EMS due to concern by his brother not looking well, not eating or taking his medications for several days.  Upon EMS arrival, his blood glucose was 57 was given D10.  Work-up revealed AKI on CKD 4, suspected prerenal secondary to dehydration.  Also revealed left lower lobe community-acquired pneumonia, left lower extremity cellulitis and age-indeterminate left lower extremity DVT.  MRI left foot could not rule out second digit osteomyelitis.  Started on empiric IV antibiotics and full dose subcu Lovenox twice daily.  Podiatry Dr. Posey Pronto consulted, will see in consultation. MRI thoracic spine 09/17/2021 no evidence for discitis.  Seen by podiatry Dr. Posey Pronto, plan to follow-up outpatient.  No evidence of infection or osteomyelitis to the second digits.  No local wound care indicated.  Okay to be discharged from podiatry standpoint.  Will follow-up with podiatry outpatient.  Hospital course complicated by intractable nausea and vomiting for which GI was consulted.  Abdominal x-ray was unrevealing.  Plan for EGD on 09/23/21.  09/22/2021: Seen and examined at his bedside.  He is alert oriented x3.  Reports epigastric pain and nausea.  Assessment/Plan:   Resolved acute kidney injury on chronic kidney disease stage 3B: Likely prerenal secondary to dehydration, UA pyuria and proteinuria. Renal function is back to baseline 2.2 with GFR of 31. His home Lasix was held and he received gentle IV fluid hydration. Presented with creatinine of 4.62 Continue to avoid nephrotoxic agents and hypotension.  Treated community-acquired pneumonia/left lower lobe  pneumonia, POA: Vital signs are stable, no longer requiring oxygen supplementation. Received 3 days of IV Rocephin and azithromycin. Switched to Keflex on 09/21/2021.  Doxycycline started on 09/21/2021 with positive MRSA screening. On antibiotics stopped due to intractable nausea and vomiting Rocephin was started on 09/22/21, IV linezolid on 09/22/21.  Persistent intractable nausea and vomiting, prior history of esophagitis, seen on EGD 11/25/2019 Bowel movement within the last 24 hours Obtain abdominal x-ray Start daily p.o. Protonix 40 mg IV antiemetics as needed Avoid dehydration  Acute metabolic encephalopathy suspect delirium from acute illness Regulate sleep and wake cycle, out of bed to chair during the day, open blinds.  Reorient often. Delirium precautions  Left lower extremity swelling concerning for cellulitis, resolving. Dopplers positive for age-indeterminate DVT left lower extremity. MRI of the left foot, osteomyelitis of second digit could not be completely ruled out, unlikely per podiatry.  Seen by podiatry Dr. Posey Pronto, plan to follow-up outpatient.  No evidence of infection or osteomyelitis to the second digits.  No local wound care indicated.  Okay to be discharged from podiatry standpoint.  Will need to follow-up with podiatry outpatient. IV vancomycin switched to IV linezolid on 09/19/2021.  Then switched to doxycycline on 09/21/2021.  Then switched back to IV linezolid on 09/22/2021.  History of upper GI bleed with ground coffee emesis/LA grade A seen on EGD done on 11/25/2019 by Dr. Benson Norway  No PPI seen on his list of home medications Continue IV Protonix 40 mg twice daily. Hemoglobin stable 9.1 from 8.2. No overt bleeding Plan for EGD on 09/23/2021. Appreciate GIs assistance.  Left lower extremity age-indeterminate DVT, seen on Doppler ultrasound 09/17/2021 Initially on  full dose subcu Lovenox twice daily Switched to Eliquis on 09/20/2021. Started heparin drip on 09/22/2021 due  to possible EGD on 09/23/2021.  Hold off heparin drip at 8 AM on 09/23/21 for possible EGD at noon.  Low back pain with prior history of thoracic spine discitis:  MRI of the T-spine no recurrent osteomyelitis or diskitis seen.  Diabetes mellitus type 2 with hyperglycemia: Hemoglobin A1c 5.9 on 06/03/2021. Hemoglobin A1c 6.0 09/20/21 Avoid hypoglycemia  Essential hypertension: Currently on Norvasc 5 mg daily, clonidine 0.1 mg twice daily, p.o. Lasix 20 mg daily, Lopressor 50 mg twice daily Continue to closely monitor vital signs.  Anemia of chronic renal disease/iron deficiency anemia: Iron deficiency noted on iron studies. Hemoglobin 8.2>> 9.1. Continue iron supplement  Chronic pain: Pain control and bowel regimen  Consult: Podiatry  Physical debility PT assessed and recommended SNF TOC assisting with SNF placement. Continue PT OT with assistance and fall precautions.  Obesity BMI 33 Recommend weight los outpatient with regular physical activity and healthy dieting.  Dry eyes Eardrops      ARF (acute renal failure) (HCC) Active Problems:   [name], right (HCC)   [name], type 2) (HCC)   HTN (hypertension)   BPH (benign prostatic hyperplasia)   Chronic pain syndrome   CAP (community acquired pneumonia)  RN Pressure Injury Documentation: Pressure Injury 06/03/21 Hip Anterior;Left Stage 1 -  Intact skin with non-blanchable redness of a localized area usually over a bony prominence. skin intact and has dark red bruising (Active)  06/03/21 0238  Location: Hip  Location Orientation: Anterior;Left  Staging: Stage 1 -  Intact skin with non-blanchable redness of a localized area usually over a bony prominence.  Wound Description (Comments): skin intact and has dark red bruising  Present on Admission: Yes     Pressure Injury 06/03/21 Penis Mid;Circumferential Stage 2 -  Partial thickness loss of dermis presenting as a shallow open injury with a red,  pink wound bed without slough. swollen, red, and weeping with slight tear on top (Active)  06/03/21 0240  Location: Penis  Location Orientation: Mid;Circumferential  Staging: Stage 2 -  Partial thickness loss of dermis presenting as a shallow open injury with a red, pink wound bed without slough.  Wound Description (Comments): swollen, red, and weeping with slight tear on top  Present on Admission: Yes    Estimated body mass index is 30.9 kg/m as calculated from the following:   Height as of this encounter: 6\' 6"  (1.981 m).   Weight as of this encounter: 121.3 kg.    DVT prophylaxis: Eliquis Family Communication:none Status is: Inpatient.  Patient requires at least 2 midnights for further evaluation and treatment of present condition.   Code Status:     Code Status Orders  (From admission, onward)           Start     Ordered   09/17/21 0347  Full code  Continuous        09/17/21 0349           Code Status History     Date Active Date Inactive Code Status Order ID Comments User Context   06/02/2021 2155 06/07/2021 0017 Full Code [id number]  Geradine Girt, DO ED   08/30/2020 0853 10/03/2020 2226 Full Code [id number]  Mosetta Anis, MD ED   11/24/2019 0932 12/17/2019 1907 Full Code [id number]  Karmen Bongo, MD Inpatient   08/31/2019 0300 10/09/2019 1947 Full Code [id number]  Hal Hope,  Doreatha Lew, MD Inpatient         IV Access:   Peripheral IV   Procedures and diagnostic studies:   DG Abd Portable 1V  Result Date: 09/21/2021 CLINICAL DATA:  Nausea and vomiting, abdominal pain. EXAM: PORTABLE ABDOMEN - 1 VIEW COMPARISON:  Plain film of the abdomen dated 09/15/2020. FINDINGS: Visualized bowel gas pattern is nonobstructive. No evidence of soft tissue mass or abnormal fluid collection. No evidence of free intraperitoneal air. There is a dense opacity at the LEFT lung base, similar to chest x-ray of 09/16/2021, suspected pneumonia. IMPRESSION: 1. Nonobstructive  bowel gas pattern and no evidence of acute intra-abdominal abnormality. 2. Probable LEFT lower lobe pneumonia. Electronically Signed   By: Franki Cabot M.D.   On: 09/21/2021 14:22     Medical Consultants:  Podiatry.    Objective:    Vitals:   09/22/21 0100 09/22/21 0411 09/22/21 0420 09/22/21 0711  BP: (!) 176/95 (!) 172/89  (!) 175/99  Pulse:  78    Resp:  16    Temp:  99.1 F (37.3 C)    TempSrc:  Oral    SpO2:  95%    Weight:   121.3 kg   Height:       SpO2: 95 % O2 Flow Rate (L/min): 1.5 L/min   Intake/Output Summary (Last 24 hours) at 09/22/2021 1016 Last data filed at 09/22/2021 0600 Gross per 24 hour  Intake 688.17 ml  Output 1250 ml  Net -561.83 ml   Filed Weights   09/16/21 2200 09/18/21 0453 09/22/21 0420  Weight: 130.2 kg 132 kg 121.3 kg    Exam: General exam: Frail-appearing no acute stress.  He is alert and oriented \\x2 .   Respiratory system: Clear to auscultation with no wheezes or rales.  Poor inspiratory effort. Cardiovascular system: Regular rate and rhythm no rubs or gallops.   Gastrointestinal system: Obese epigastric pain with tenderness on palpation.  Bowel sounds present.   Extremities: Right below the knee amputation. Skin: Resolving cellulitis of left lower extremity. Psychiatry: Mood is appropriate for condition and setting. Neuro: Nonfocal exam. Data Reviewed:    Labs: Basic Metabolic Panel: Recent Labs  Lab 09/16/21 2245 09/18/21 0934 09/19/21 0506 09/20/21 0613 09/21/21 0700  NA 135 140  --  138  --   K 4.3 4.7  --  4.9  --   CL 104 113*  --  109  --   CO2 20* 21*  --  22  --   GLUCOSE 86 106*  --  111*  --   BUN 73* 58*  --  30*  --   CREATININE 4.62* 3.51* 2.86* 2.35*  2.33* 2.22*  CALCIUM 7.3* 7.1*  --  7.5*  --   PHOS  --   --   --  3.8  --    GFR Estimated Creatinine Clearance: 46.6 mL/min (A) (by C-G formula based on SCr of 2.22 mg/dL (H)). Liver Function Tests: Recent Labs  Lab 09/16/21 2245 09/20/21 0613   AST 26  --   ALT 11  --   ALKPHOS 82  --   BILITOT 0.5  --   PROT 8.2*  --   ALBUMIN 3.1* 2.8*   No results for input(s): LIPASE, AMYLASE in the last 168 hours. No results for input(s): AMMONIA in the last 168 hours. Coagulation profile No results for input(s): INR, PROTIME in the last 168 hours. COVID-19 Labs  No results for input(s): DDIMER, FERRITIN, LDH, CRP in the last 72  hours.   Lab Results  Component Value Date   SARSCOV2NAA NEGATIVE 09/16/2021   SARSCOV2NAA NEGATIVE 06/06/2021   [employer] [place] NEGATIVE 06/02/2021   [place] NEGATIVE 10/05/2020    CBC: Recent Labs  Lab 09/16/21 2245 09/18/21 0934 09/20/21 0613  WBC 7.0 5.2 5.6  NEUTROABS 5.7  --   --   HGB 8.7* 8.2* 9.1*  HCT 28.2* 26.3* 29.1*  MCV 101.8* 100.4* 100.0  PLT 228 213 238   Cardiac Enzymes: No results for input(s): CKTOTAL, CKMB, CKMBINDEX, TROPONINI in the last 168 hours. BNP (last 3 results) No results for input(s): PROBNP in the last 8760 hours. CBG: Recent Labs  Lab 09/21/21 1614 09/21/21 2009 09/22/21 0001 09/22/21 0406 09/22/21 0754  GLUCAP 108* 97 119* 102* 98   D-Dimer: No results for input(s): DDIMER in the last 72 hours.  Hgb A1c: Recent Labs    09/20/21 0613  HGBA1C 6.0*   Lipid Profile: No results for input(s): CHOL, HDL, LDLCALC, TRIG, CHOLHDL, LDLDIRECT in the last 72 hours. Thyroid function studies: No results for input(s): TSH, T4TOTAL, T3FREE, THYROIDAB in the last 72 hours.  Invalid input(s): FREET3 Anemia work up: No results for input(s): VITAMINB12, FOLATE, FERRITIN, TIBC, IRON, RETICCTPCT in the last 72 hours.  Sepsis Labs: Recent Labs  Lab 09/16/21 2245 09/18/21 0934 09/20/21 0613  PROCALCITON  --  0.12  --   WBC 7.0 5.2 5.6  LATICACIDVEN 0.8  --   --    Microbiology Recent Results (from the past 240 hour(s))  Blood culture (routine x 2)     Status: None   Collection Time: 09/16/21 10:45 PM   Specimen: BLOOD  Result Value Ref Range Status    Specimen Description   Final    BLOOD BLOOD RIGHT ARM Performed at [facility], [place] [address] [place], [place] [address]    Special Requests   Final    BOTTLES DRAWN AEROBIC AND ANAEROBIC Blood Culture adequate volume Performed at [place] [address] [place], [place] [address]    Culture   Final    NO GROWTH 5 DAYS Performed at [facility], [place] [address] [place], [place] [address]    Report Status 09/22/2021 FINAL  Final  Resp Panel by RT-PCR (Flu A&B, Covid)     Status: None   Collection Time: 09/16/21 10:45 PM   Specimen: Nasopharyngeal(NP) swabs in vial transport medium  Result Value Ref Range Status   SARS Coronavirus 2 by RT PCR NEGATIVE NEGATIVE Final    Comment: (NOTE) SARS-CoV-2 target nucleic acids are NOT DETECTED.  The SARS-CoV-2 RNA is generally detectable in upper respiratory specimens during the acute phase of infection. The lowest concentration of SARS-CoV-2 viral copies this assay can detect is 138 copies/mL. A negative result does not preclude SARS-Cov-2 infection and should not be used as the sole basis for treatment or other patient management decisions. A negative result may occur with  improper specimen collection/handling, submission of specimen other than nasopharyngeal swab, presence of viral mutation(s) within the areas targeted by this assay, and inadequate number of viral copies(<138 copies/mL). A negative result must be combined with clinical observations, patient history, and epidemiological information. The expected result is Negative.  Fact Sheet for Patients:  EntrepreneurPulse.com.au  Fact Sheet for Healthcare Providers:  IncredibleEmployment.be  This test is no t yet approved or cleared by the [place] FDA and  has been authorized for detection and/or diagnosis of SARS-CoV-2 by FDA under an Emergency Use  Authorization (EUA). This EUA will  remain  in effect (meaning this test can be used) for the duration of the COVID-19 declaration under Section 564(b)(1) of the Act, 21 U.S.C.section 360bbb-3(b)(1), unless the authorization is terminated  or revoked sooner.       Influenza A by PCR NEGATIVE NEGATIVE Final   Influenza B by PCR NEGATIVE NEGATIVE Final    Comment: (NOTE) The Xpert Xpress SARS-CoV-2/FLU/RSV plus assay is intended as an aid in the diagnosis of influenza from Nasopharyngeal swab specimens and should not be used as a sole basis for treatment. Nasal washings and aspirates are unacceptable for Xpert Xpress SARS-CoV-2/FLU/RSV testing.  Fact Sheet for Patients: EntrepreneurPulse.com.au  Fact Sheet for Healthcare Providers: IncredibleEmployment.be  This test is not yet approved or cleared by the [place] FDA and has been authorized for detection and/or diagnosis of SARS-CoV-2 by FDA under an Emergency Use Authorization (EUA). This EUA will remain in effect (meaning this test can be used) for the duration of the COVID-19 declaration under Section 564(b)(1) of the Act, 21 U.S.C. section 360bbb-3(b)(1), unless the authorization is terminated or revoked.  Performed at [facility], [place] [address] [place], [place] [address]   Urine Culture     Status: None   Collection Time: 09/18/21 12:25 PM   Specimen: Urine, Clean Catch  Result Value Ref Range Status   Specimen Description   Final    URINE, CLEAN CATCH Performed at [facility], [place] [address] [place], [place] [address]    Special Requests   Final    NONE Performed at [facility], [place] [address], [place], [place] [address]    Culture   Final    NO GROWTH Performed at [facility], [place] [address], [place], [place] [address]    Report Status 09/19/2021 FINAL  Final  MRSA Next Gen by PCR, Nasal     Status: Abnormal   Collection Time: 09/21/21   6:16 AM   Specimen: Nasal Mucosa; Nasal Swab  Result Value Ref Range Status   MRSA by PCR Next Gen DETECTED (A) NOT DETECTED Final    Comment: RESULT CALLED TO, READ BACK BY AND VERIFIED WITH: HUGHUY, S. RN ON 09/21/2021 @ 0941 BY MECIAL J. (NOTE) The GeneXpert MRSA Assay (FDA approved for NASAL specimens only), is one component of a comprehensive MRSA colonization surveillance program. It is not intended to diagnose MRSA infection nor to guide or monitor treatment for MRSA infections. Test performance is not FDA approved in patients less than 71 years old. Performed at [facility], [place] [address] [place],  [address]      Medications:    allopurinol  100 mg Oral Daily   amLODipine  5 mg Oral Daily   atorvastatin  80 mg Oral Daily   bisacodyl  10 mg Rectal Once   Chlorhexidine Gluconate Cloth  6 each Topical Q0600   cloNIDine  0.2 mg Transdermal Weekly   colchicine  0.6 mg Oral Daily   ferrous sulfate  325 mg Oral Q breakfast   finasteride  5 mg Oral Daily   gabapentin  300 mg Oral TID   insulin aspart  0-9 Units Subcutaneous TID WC   levothyroxine  25 mcg Intravenous Daily   mouth rinse  15 mL Mouth Rinse BID   metoprolol tartrate  5 mg Intravenous Q12H   mupirocin ointment  1 application Nasal BID   naloxegol oxalate  12.5 mg Oral Daily  naphazoline-pheniramine  2 drop Both Eyes TID   oxyCODONE  30 mg Oral Q12H   pantoprazole (PROTONIX) IV  40 mg Intravenous Q12H   saccharomyces boulardii  250 mg Oral BID   senna-docusate  2 tablet Oral BID   simethicone  80 mg Oral QID   tamsulosin  0.4 mg Oral Daily   vitamin B-12  200 mcg Oral Daily   Continuous Infusions:  dextrose 5 % and 0.9% NaCl     methocarbamol (ROBAXIN) IV     promethazine (PHENERGAN) injection (IM or IVPB) Stopped (09/20/21 1438)      LOS: 5 days   Kayleen Memos  [facility]  09/22/2021, 10:16 AM

## 2021-09-22 NOTE — Progress Notes (Addendum)
Patient non-compliant. Refused labs earlier this morning. We were able to get some labs. Required patient to be held down due to him swatting his arms, kicking, and yelling. Dr. Nevada Crane made aware.

## 2021-09-22 NOTE — Progress Notes (Signed)
[place] for lovenox --> Eliquis > Heparin gtt Indication: DVT  Patient Measurements: Height: 6\' 6"  (198.1 cm) Weight: 121.3 kg (267 lb 6.7 oz) IBW/kg (Calculated) : 91.4 Heparin Dosing Weight:   Vital Signs: Temp: 99.1 F (37.3 C) (12/04 0411) Temp Source: Oral (12/04 0411) BP: 175/99 (12/04 0711) Pulse Rate: 78 (12/04 0411)  Labs: Recent Labs    09/20/21 0613 09/21/21 0700  HGB 9.1*  --   HCT 29.1*  --   PLT 238  --   CREATININE 2.35*  2.33* 2.22*    Estimated Creatinine Clearance: 46.6 mL/min (A) (by C-G formula based on SCr of 2.22 mg/dL (H)).  Assessment: Patient's a 77 M presented to the ED on 11/28 with c/o LE swelling and pain. LE doppler on 11/29 showed age indeterminate DVT of L femoral vein. Was started on lovemox for VTE treatment. Pharmacy has been consulted on 12/2 to transition patient to Eliquis.  12/2 pm transitioned Lovenox > Apixaban - charted one dose. 12/3 with nausea, am Apixaban not charted, pm dose vomited.  Now unable to take po's, anti-coagulation transitioned to Heparin gtt, plan EGD 12/5 Per GI no Lovenox, can start Heparin for LE DVT  Per RN patient will not tolerate Heparin IV or labs   Plan:  Begin Heparin at 1800 units/hr, unable to obtain baseline aPTT or HL, so will not give Heparin bolus Check aPTT and Hep level at 8 hr after start of Heparin Plan stop Heparin on 12/5 at 0800 for EGD Aware pt may refuse labs, may not tolerate Heparin - no other option prior to EGD to assess N/V  Minda Ditto PharmD 09/22/2021,9:51 AM

## 2021-09-22 NOTE — Progress Notes (Signed)
Rapid Response Event Note   Reason for Call : increased agitation   Initial Focused Assessment:  Pt alert but very restless. While the pt is able to look at me when I call his name..he doesn't answer any of my questions. Score of 7 on the CPOT scale - I think restlessness could be d/t pain     Interventions:  Rechecked restraint ties/repositioned pt  Plan of Care:  Continue monitoring - managing pain and BP with PRNs   Event Summary:   MD Notified: X. Blount Call Time: Arrival Time: 2035D End Time:  Quincy Simmonds, RN

## 2021-09-22 NOTE — Progress Notes (Signed)
[place] for lovenox --> Eliquis > Heparin gtt Indication: DVT  Patient Measurements: Height: 6\' 6"  (198.1 cm) Weight: 121.3 kg (267 lb 6.7 oz) IBW/kg (Calculated) : 91.4 Heparin Dosing Weight:   Vital Signs: Temp: 98.4 F (36.9 C) (12/04 2212) Temp Source: Oral (12/04 2212) BP: 179/123 (12/04 2212) Pulse Rate: 98 (12/04 2212)  Labs: Recent Labs    09/20/21 0613 09/21/21 0700 09/22/21 0957 09/22/21 2007  HGB 9.1*  --  10.3*  --   HCT 29.1*  --  31.6*  --   PLT 238  --  266  --   APTT  --   --   --  58*  HEPARINUNFRC  --   --   --  >1.10*  CREATININE 2.35*  2.33* 2.22* 2.43*  --     Estimated Creatinine Clearance: 42.6 mL/min (A) (by C-G formula based on SCr of 2.43 mg/dL (H)).  Assessment: Patient's a 71 M presented to the ED on 11/28 with c/o LE swelling and pain. LE doppler on 11/29 showed age indeterminate DVT of L femoral vein. Was started on lovemox for VTE treatment. Pharmacy has been consulted on 12/2 to transition patient to Eliquis.  12/2 pm transitioned Lovenox > Apixaban - charted one dose. 12/3 with nausea, am Apixaban not charted, pm dose vomited.  Now unable to take po's, anti-coagulation transitioned to Heparin gtt, plan EGD 12/5 Per GI no Lovenox, can start Heparin for LE DVT  Per RN patient will not tolerate Heparin IV or labs  09/22/2021 aPTT 58 subtherapeutic on 1800 units/hr HL> 1.1 as expected with apixaban on board Per RN no bleeding or interruptions noted   Plan:  Increase heparin drip to 2000 units/hr Plan stop Heparin on 12/5 at 0800 for EGD Aware pt may refuse labs, may not tolerate Heparin - no other option prior to EGD to assess N/V  Dolly Rias RPh 09/22/2021, 10:32 PM

## 2021-09-22 NOTE — Progress Notes (Signed)
Continuous fluids stopped due to patient only having one IV and it not being compatible with Heparin. Started Heparin drip. Patient refused to get another IV. Dr. Nevada Crane made aware.

## 2021-09-23 ENCOUNTER — Inpatient Hospital Stay (HOSPITAL_COMMUNITY): Payer: Medicare Other

## 2021-09-23 ENCOUNTER — Encounter (HOSPITAL_COMMUNITY)
Admission: EM | Disposition: A | Discharge status: Home Health | Payer: Self-pay | Source: Home / Self Care | Attending: Internal Medicine

## 2021-09-23 DIAGNOSIS — N400 Enlarged prostate without lower urinary tract symptoms: Secondary | ICD-10-CM | POA: Diagnosis not present

## 2021-09-23 DIAGNOSIS — J189 Pneumonia, unspecified organism: Secondary | ICD-10-CM

## 2021-09-23 DIAGNOSIS — N179 Acute kidney failure, unspecified: Secondary | ICD-10-CM | POA: Diagnosis not present

## 2021-09-23 LAB — BLOOD GAS, ARTERIAL
Acid-base deficit: 0.8 mmol/L (ref 0.0–2.0)
Acid-base deficit: 3.5 mmol/L — ABNORMAL HIGH (ref 0.0–2.0)
Bicarbonate: 20.3 mmol/L (ref 20.0–28.0)
Bicarbonate: 20.4 mmol/L (ref 20.0–28.0)
Drawn by: 331471
FIO2: 32
FIO2: 60
MECHVT: 550 mL
O2 Content: 3 L/min
O2 Saturation: 96 %
O2 Saturation: 98 %
PEEP: 5 cmH2O
Patient temperature: 98.6
Patient temperature: 99
RATE: 22 resp/min
pCO2 arterial: 24.2 mmHg — ABNORMAL LOW (ref 32.0–48.0)
pCO2 arterial: 34.9 mmHg (ref 32.0–48.0)
pH, Arterial: 7.533 — ABNORMAL HIGH (ref 7.350–7.450)
pH, Arterial: 7.384 (ref 7.350–7.450)
pO2, Arterial: 74.5 mmHg — ABNORMAL LOW (ref 83.0–108.0)
pO2, Arterial: 110 mmHg — ABNORMAL HIGH (ref 83.0–108.0)

## 2021-09-23 LAB — CBC WITH DIFFERENTIAL/PLATELET
Abs Immature Granulocytes: 0.1 10*3/uL — ABNORMAL HIGH (ref 0.00–0.07)
Basophils Absolute: 0 10*3/uL (ref 0.0–0.1)
Basophils Relative: 0 %
Eosinophils Absolute: 0 10*3/uL (ref 0.0–0.5)
Eosinophils Relative: 0 %
HCT: 36.8 % — ABNORMAL LOW (ref 39.0–52.0)
Hemoglobin: 11.7 g/dL — ABNORMAL LOW (ref 13.0–17.0)
Immature Granulocytes: 1 %
Lymphocytes Relative: 6 %
Lymphs Abs: 0.7 10*3/uL (ref 0.7–4.0)
MCH: 30.4 pg (ref 26.0–34.0)
MCHC: 31.8 g/dL (ref 30.0–36.0)
MCV: 95.6 fL (ref 80.0–100.0)
Monocytes Absolute: 0.7 10*3/uL (ref 0.1–1.0)
Monocytes Relative: 5 %
Neutro Abs: 11.1 10*3/uL — ABNORMAL HIGH (ref 1.7–7.7)
Neutrophils Relative %: 88 %
Platelets: 339 10*3/uL (ref 150–400)
RBC: 3.85 MIL/uL — ABNORMAL LOW (ref 4.22–5.81)
RDW: 14.6 % (ref 11.5–15.5)
WBC: 12.6 10*3/uL — ABNORMAL HIGH (ref 4.0–10.5)
nRBC: 0 % (ref 0.0–0.2)

## 2021-09-23 LAB — LACTIC ACID, PLASMA: Lactic Acid, Venous: 1.3 mmol/L (ref 0.5–1.9)

## 2021-09-23 LAB — MAGNESIUM
Magnesium: 1.7 mg/dL (ref 1.7–2.4)
Magnesium: 2 mg/dL (ref 1.7–2.4)

## 2021-09-23 LAB — COMPREHENSIVE METABOLIC PANEL
ALT: 20 U/L (ref 0–44)
AST: 49 U/L — ABNORMAL HIGH (ref 15–41)
Albumin: 3.3 g/dL — ABNORMAL LOW (ref 3.5–5.0)
Alkaline Phosphatase: 94 U/L (ref 38–126)
Anion gap: 12 (ref 5–15)
BUN: 28 mg/dL — ABNORMAL HIGH (ref 8–23)
CO2: 17 mmol/L — ABNORMAL LOW (ref 22–32)
Calcium: 7.6 mg/dL — ABNORMAL LOW (ref 8.9–10.3)
Chloride: 110 mmol/L (ref 98–111)
Creatinine, Ser: 2.54 mg/dL — ABNORMAL HIGH (ref 0.61–1.24)
GFR, Estimated: 27 mL/min — ABNORMAL LOW (ref >60)
Glucose, Bld: 181 mg/dL — ABNORMAL HIGH (ref 70–99)
Potassium: 3.9 mmol/L (ref 3.5–5.1)
Sodium: 139 mmol/L (ref 135–145)
Total Bilirubin: 1.1 mg/dL (ref 0.3–1.2)
Total Protein: 7.9 g/dL (ref 6.5–8.1)

## 2021-09-23 LAB — APTT: aPTT: 39 seconds — ABNORMAL HIGH (ref 24–36)

## 2021-09-23 LAB — GLUCOSE, CAPILLARY
Glucose-Capillary: 143 mg/dL — ABNORMAL HIGH (ref 70–99)
Glucose-Capillary: 148 mg/dL — ABNORMAL HIGH (ref 70–99)
Glucose-Capillary: 152 mg/dL — ABNORMAL HIGH (ref 70–99)
Glucose-Capillary: 157 mg/dL — ABNORMAL HIGH (ref 70–99)
Glucose-Capillary: 162 mg/dL — ABNORMAL HIGH (ref 70–99)
Glucose-Capillary: 167 mg/dL — ABNORMAL HIGH (ref 70–99)
Glucose-Capillary: 167 mg/dL — ABNORMAL HIGH (ref 70–99)
Glucose-Capillary: 274 mg/dL — ABNORMAL HIGH (ref 70–99)

## 2021-09-23 LAB — AMMONIA: Ammonia: 22 umol/L (ref 9–35)

## 2021-09-23 LAB — HEPARIN LEVEL (UNFRACTIONATED): Heparin Unfractionated: 0.47 IU/mL (ref 0.30–0.70)

## 2021-09-23 LAB — TSH: TSH: 1.444 u[IU]/mL (ref 0.350–4.500)

## 2021-09-23 LAB — PHOSPHORUS
Phosphorus: 3.8 mg/dL (ref 2.5–4.6)
Phosphorus: 4.4 mg/dL (ref 2.5–4.6)

## 2021-09-23 LAB — PROCALCITONIN: Procalcitonin: 0.18 ng/mL

## 2021-09-23 IMAGING — DX DG ABDOMEN 1V
1 series · 1 of 1 positions shown · non-contrast
Comparison: Chest radiograph dated [DATE].

CLINICAL DATA: Intubation.

EXAM:
PORTABLE CHEST 1 VIEW

[abdomen kub]
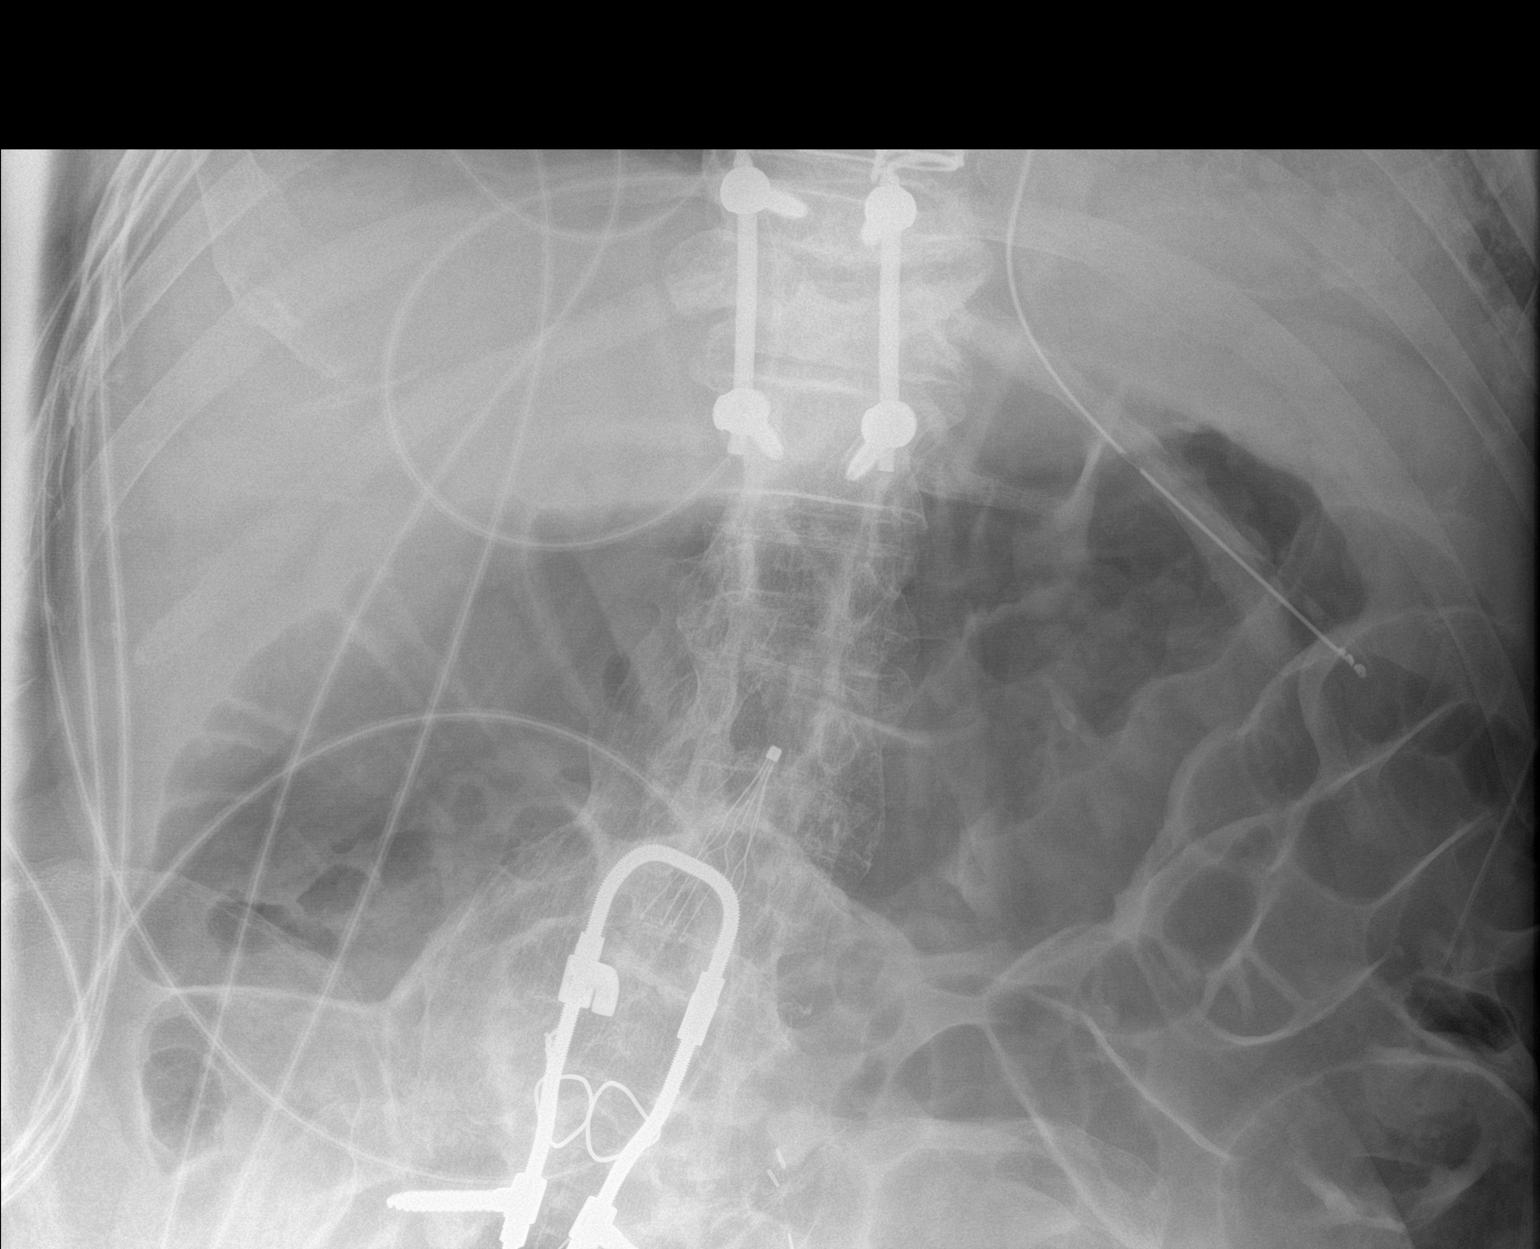

[1 of 1 positions shown; findings below may reference images not displayed]

FINDINGS: Endotracheal tube with tip approximately 3.5 cm above the carina.
Enteric tube extends below the diaphragm with tip and side-port in
the left upper abdomen likely in the proximal stomach.

Diffuse interstitial coarsening and streaky densities which may
represent chronic changes. Interval improvement of the previously
seen left lung base density. Residual infiltrate is not excluded no
pleural effusion pneumothorax. Stable cardiomegaly. Median
sternotomy wires. Atherosclerotic calcification of the aorta.

Nonspecific bowel gas pattern. Air is noted within the colon. An IVC
filter is noted. Osteopenia with degenerative changes of the spine
and spinal fusion hardware. No acute osseous pathology.
IMPRESSION: Endotracheal tube with tip above the carina. Enteric tube extends
below the diaphragm with tip and side-port in the proximal stomach.

## 2021-09-23 IMAGING — CT CT HEAD W/O CM
5 of 8 series · 17 of 47 positions shown, 18 images · non-contrast
Comparison: CT head [DATE].

CLINICAL DATA: Delirium

EXAM:
CT HEAD WITHOUT CONTRAST
TECHNIQUE: Contiguous axial images were obtained from the base of the skull
through the vertex without intravenous contrast.

[Series 2: head bone · axial · 0.47mm/px · z∈[+1574,+1658]mm · 6 of 78 slices shown]
[im 8/78  bone]
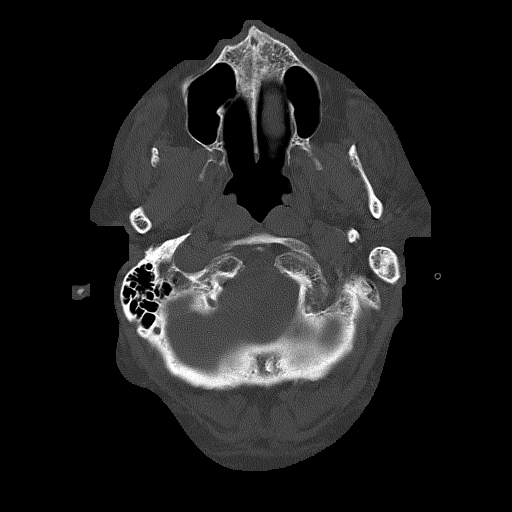
[im 15/78  bone]
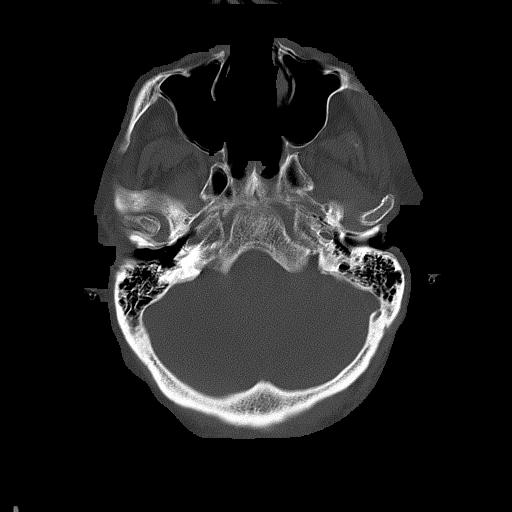
[im 29/78  bone]
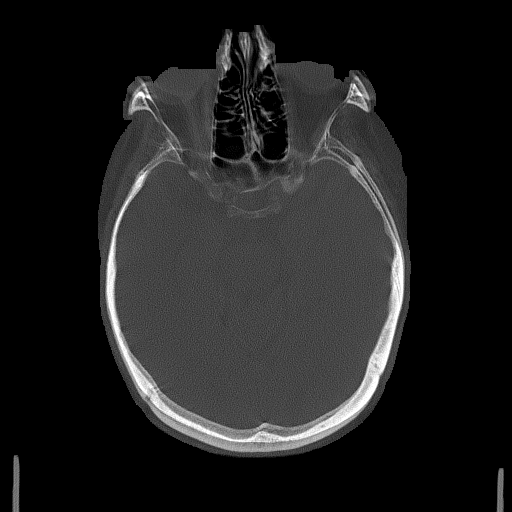
[im 36/78  bone]
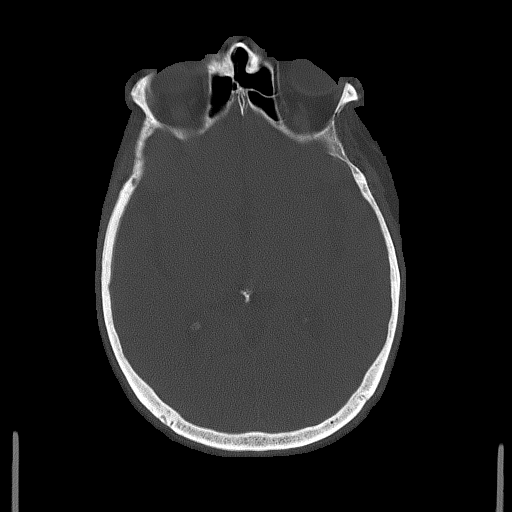
[im 43/78  bone]
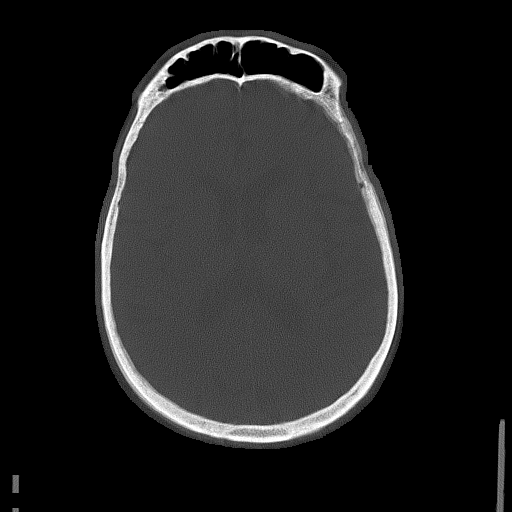
[im 50/78  bone]
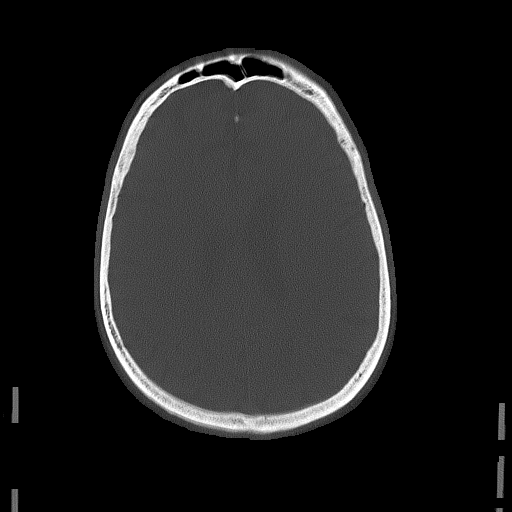

[Series 3: head wo · axial · 0.47mm/px · z∈[+1596,+1676]mm · 3 of 32 slices shown, 4 images (1 of 2)]
[im 8/32  brain]
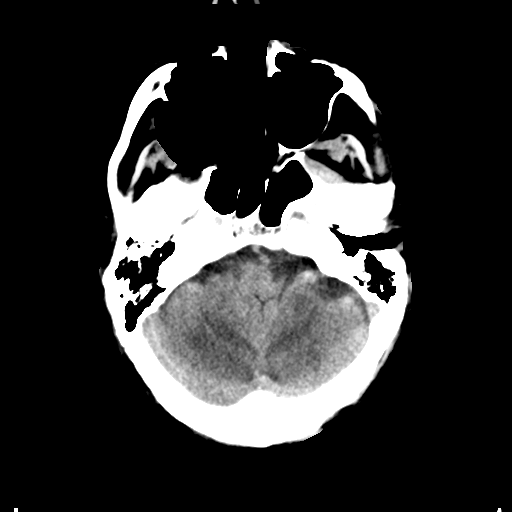
[im 8/32  bone]
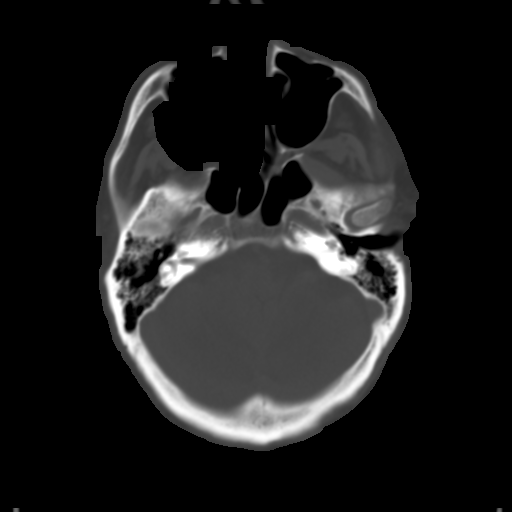
[im 16/32  brain]
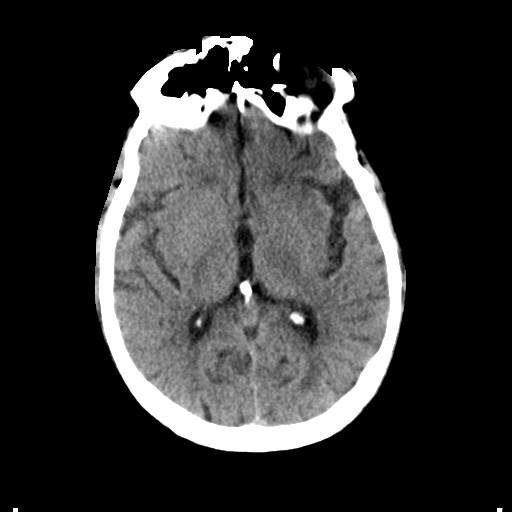
[im 24/32  brain]
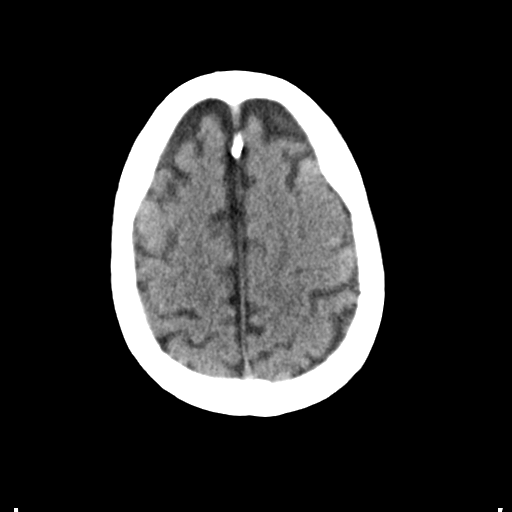

[Series 4: coronal soft tissue · coronal · 0.32mm/px · 3 of 71 slices shown]
[im 16/71  brain]
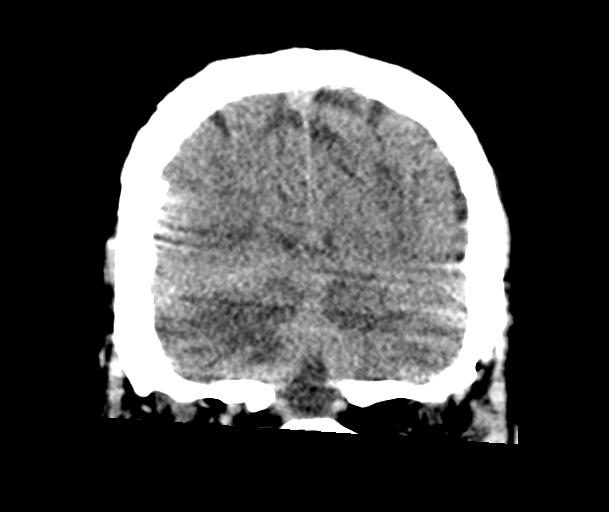
[im 31/71  brain]
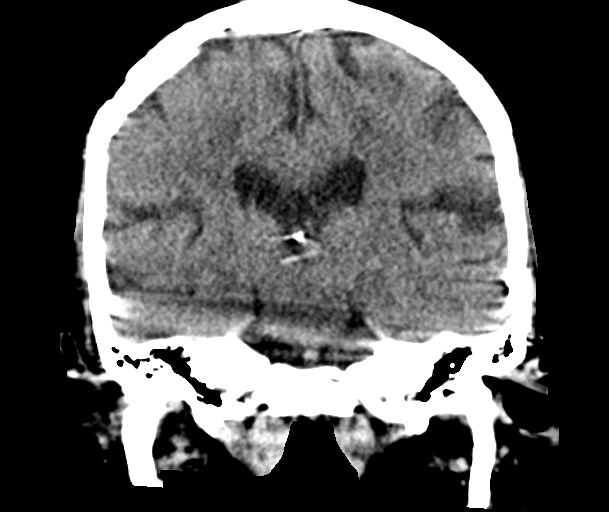
[im 46/71  brain]
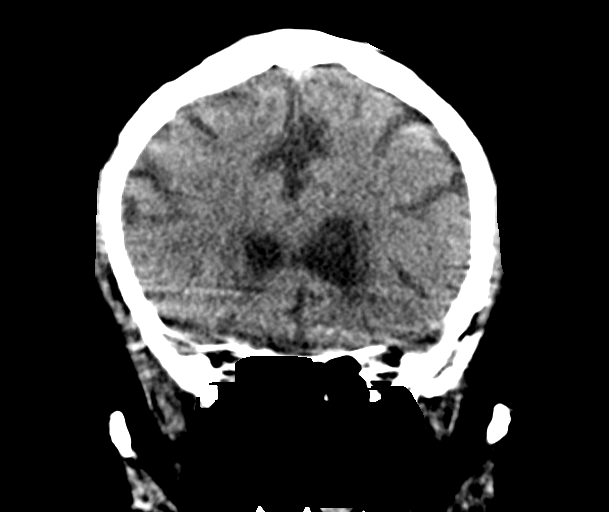

[Series 8: head wo · axial · 0.47mm/px · z∈[+1606,+1642]mm · 2 of 22 slices shown (2 of 2)]
[im 8/22  brain]
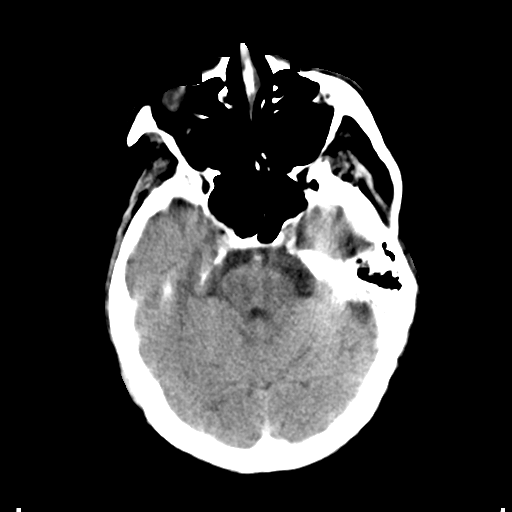
[im 15/22  brain]
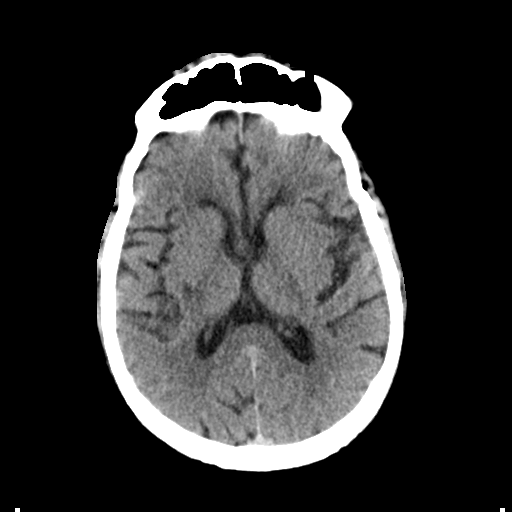

[Series 10: sagittal soft tissue · sagittal · 0.32mm/px · 3 of 65 slices shown]
[im 7/65  brain]
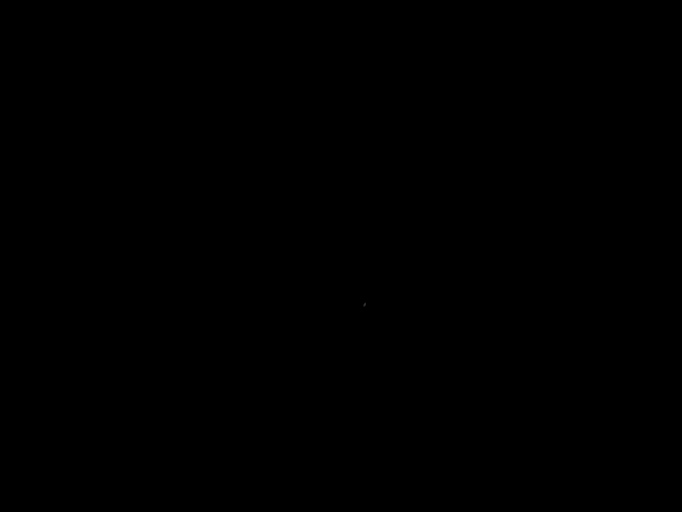
[im 26/65  brain]
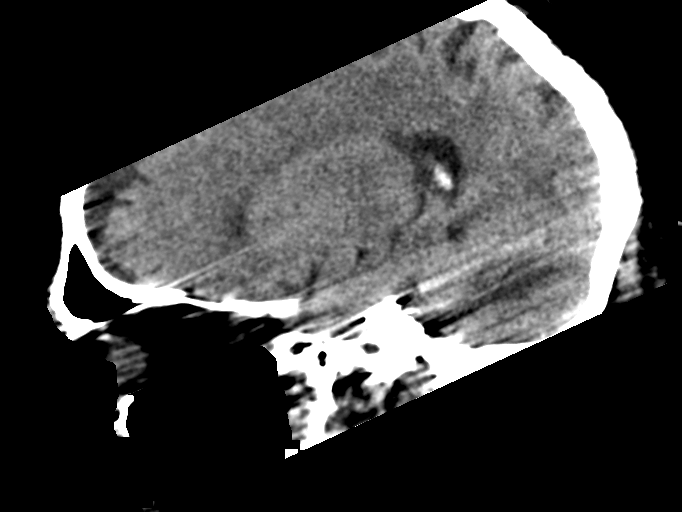
[im 45/65  brain]
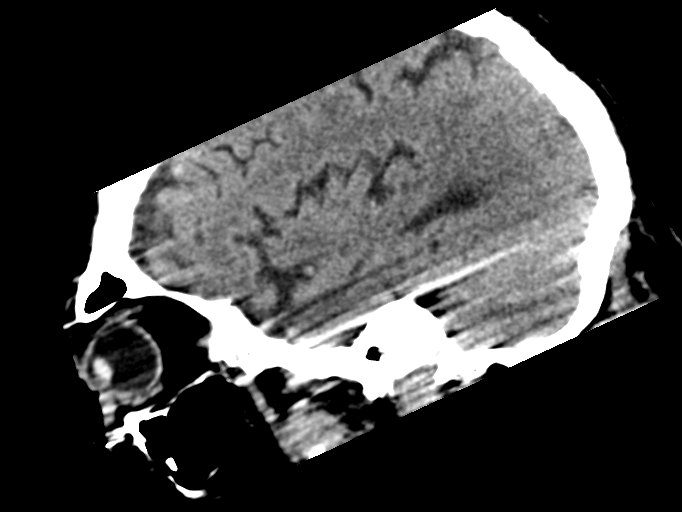

[17 of 47 positions shown; findings below may reference images not displayed]

FINDINGS: Motion limited study.  Within this limitation:

Brain: New hypodensity in the left occipital lobe (series 3, image
12; series 5, image 40). No acute hemorrhage a mass lesion, midline
shift, or extra-axial fluid collection. Similar small hypodensity in
the right basal ganglia, likely a remote lacunar infarct or dilated
perivascular space. Similar atrophy.

Vascular: No obvious hyperdense vessel with limited evaluation.

Skull: No acute fracture.

Sinuses/Orbits: Clear sinuses.  Unremarkable orbits.

Other: No mastoid effusions
IMPRESSION: Motion limited study with new hypoattenuation in the left occipital
lobe. While this could represent artifact, acute infarct is not
excluded. If the patient is able, MRI could provide more sensitive
evaluation for acute infarct.

## 2021-09-23 IMAGING — DX DG CHEST 1V PORT
1 series · 1 of 1 positions shown · non-contrast
Comparison: Chest radiograph dated [DATE].

CLINICAL DATA: Intubation.

EXAM:
PORTABLE CHEST 1 VIEW

[chest ap]
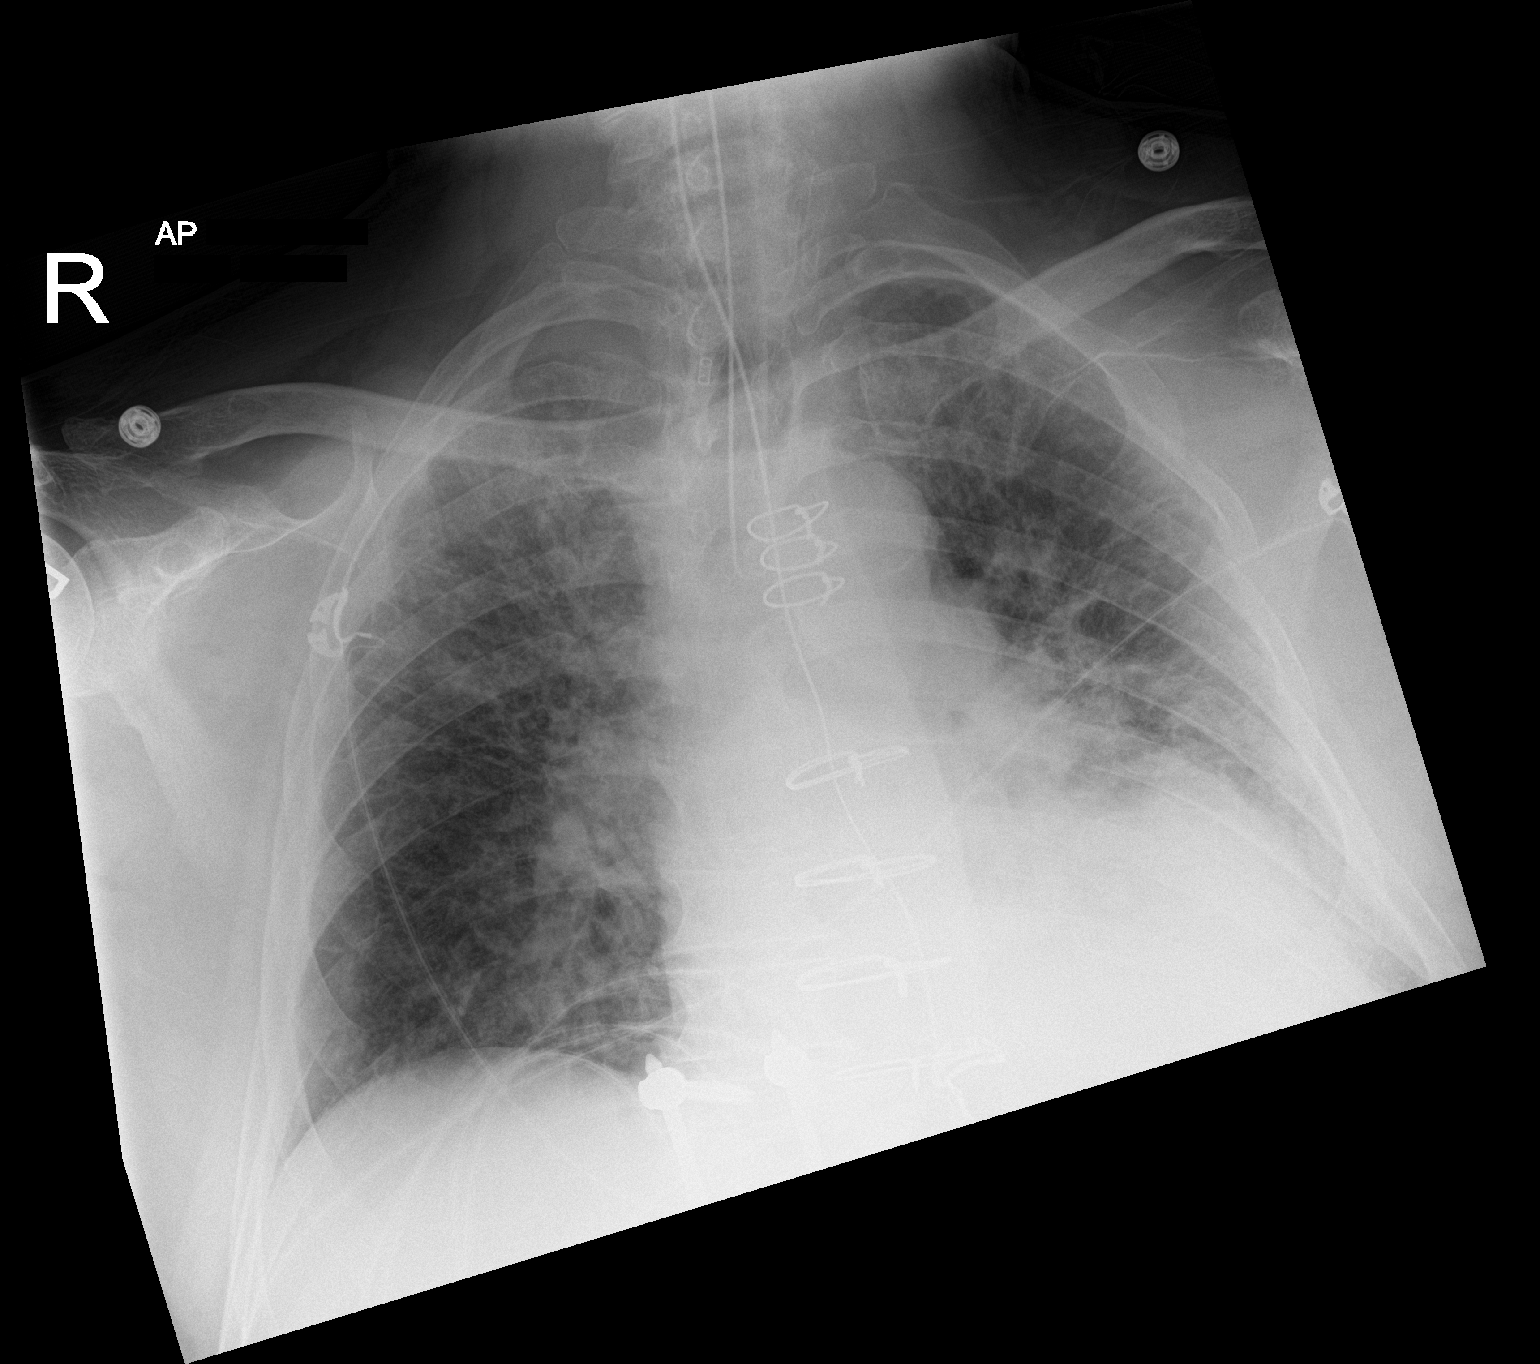

[1 of 1 positions shown; findings below may reference images not displayed]

FINDINGS: Endotracheal tube with tip approximately 3.5 cm above the carina.
Enteric tube extends below the diaphragm with tip and side-port in
the left upper abdomen likely in the proximal stomach.

Diffuse interstitial coarsening and streaky densities which may
represent chronic changes. Interval improvement of the previously
seen left lung base density. Residual infiltrate is not excluded no
pleural effusion pneumothorax. Stable cardiomegaly. Median
sternotomy wires. Atherosclerotic calcification of the aorta.

Nonspecific bowel gas pattern. Air is noted within the colon. An IVC
filter is noted. Osteopenia with degenerative changes of the spine
and spinal fusion hardware. No acute osseous pathology.
IMPRESSION: Endotracheal tube with tip above the carina. Enteric tube extends
below the diaphragm with tip and side-port in the proximal stomach.

## 2021-09-23 IMAGING — CT CT ABD-PELV W/O CM
2 of 4 series · 17 of 46 positions shown, 19 images · non-contrast
Comparison: [DATE].

CLINICAL DATA: Abdominal distension.

EXAM:
CT ABDOMEN AND PELVIS WITHOUT CONTRAST
TECHNIQUE: Multidetector CT imaging of the abdomen and pelvis was performed
following the standard protocol without IV contrast.

[Series 1: axial st · axial · 0.98mm/px · z∈[+793,+1273]mm · 14 of 108 slices shown, 16 images]
[im 6/108  soft-tissue]
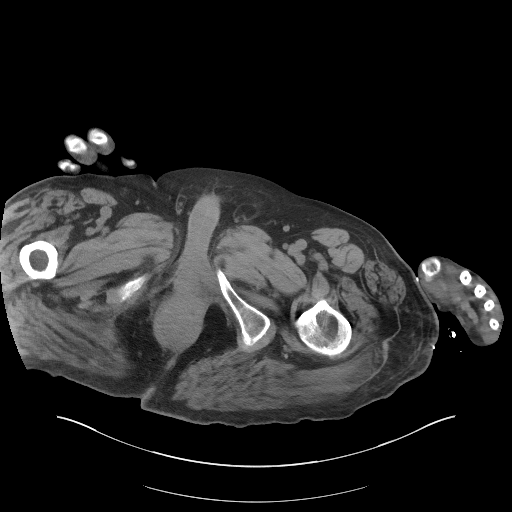
[im 6/108  bone]
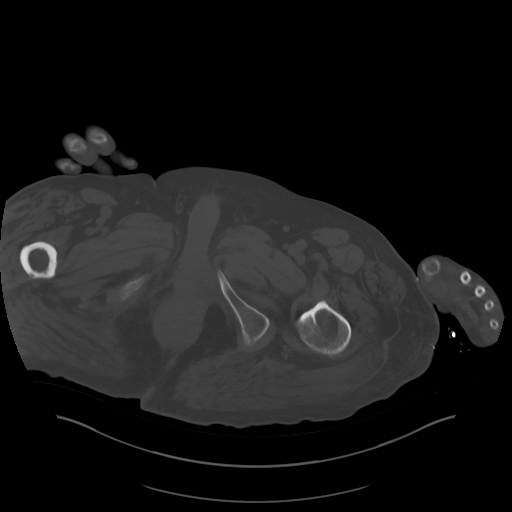
[im 12/108  soft-tissue]
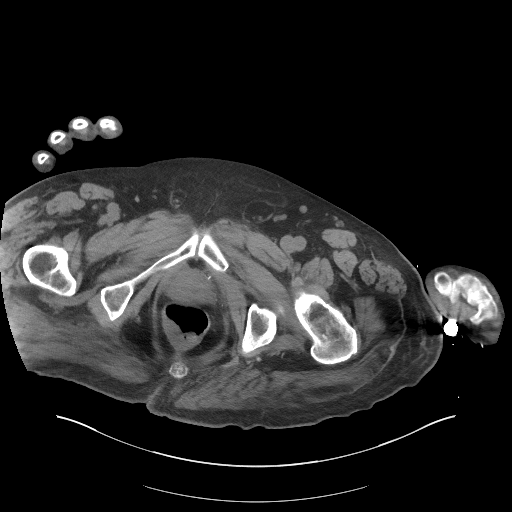
[im 24/108  soft-tissue]
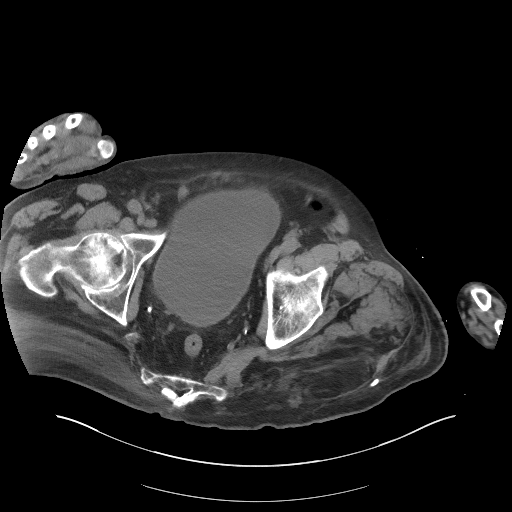
[im 30/108  soft-tissue]
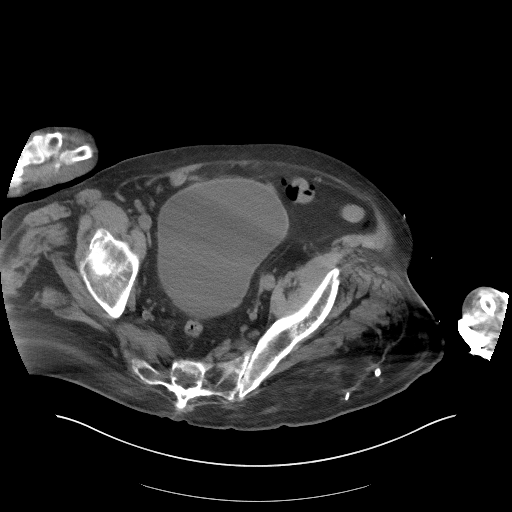
[im 36/108  soft-tissue]
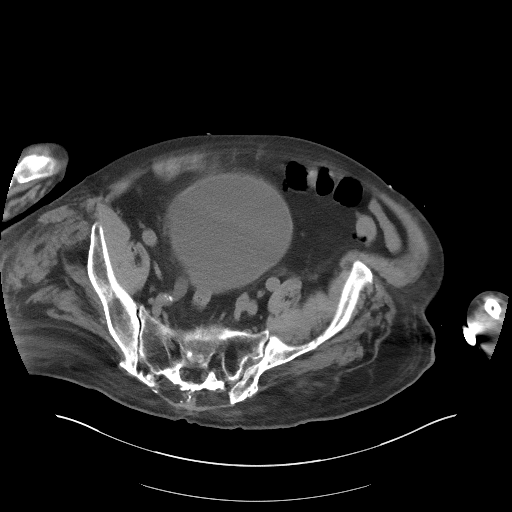
[im 42/108  soft-tissue]
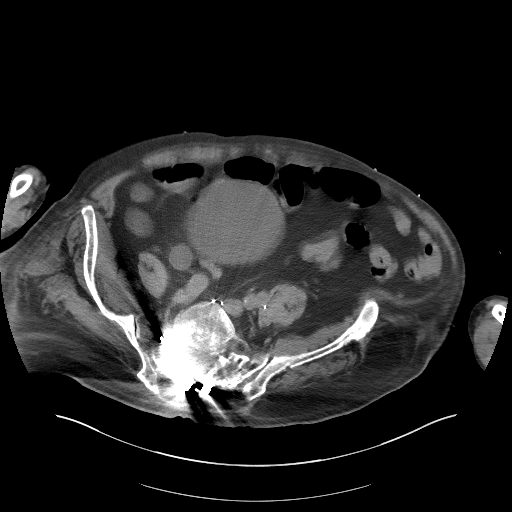
[im 48/108  soft-tissue]
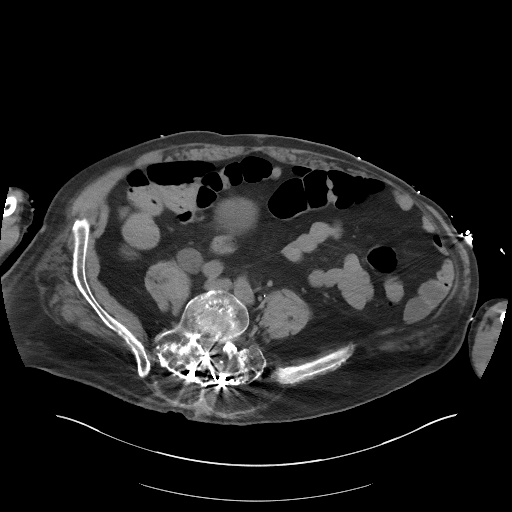
[im 60/108  soft-tissue]
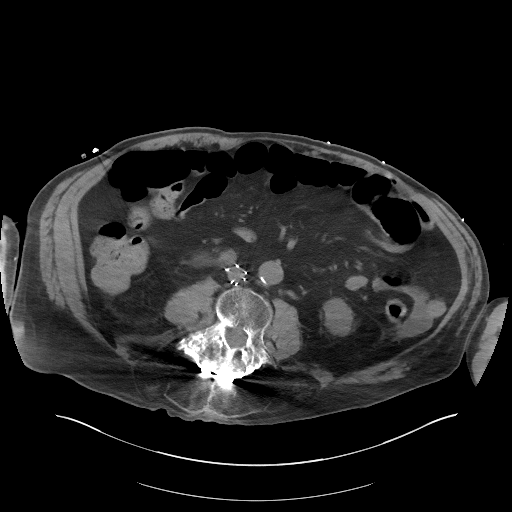
[im 66/108  soft-tissue]
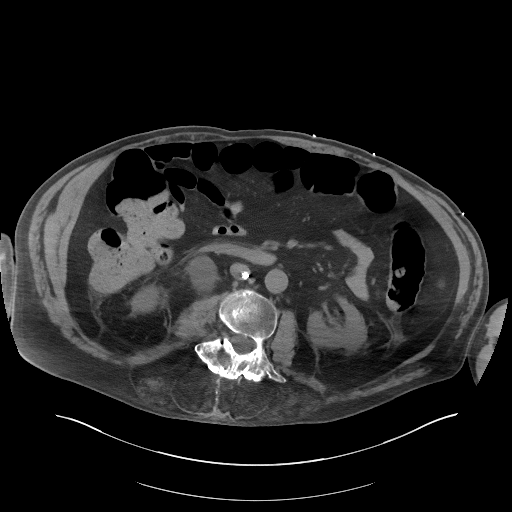
[im 66/108  bone]
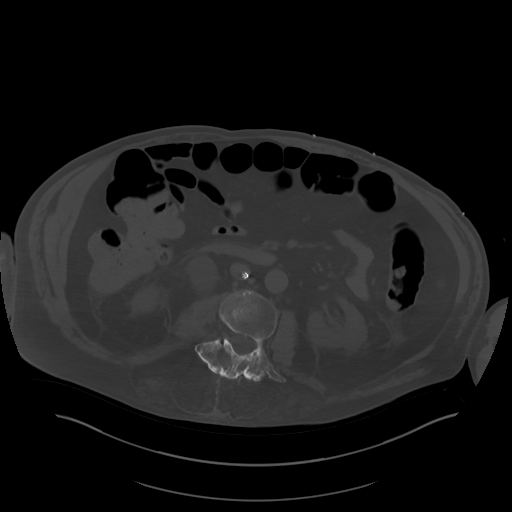
[im 72/108  soft-tissue]
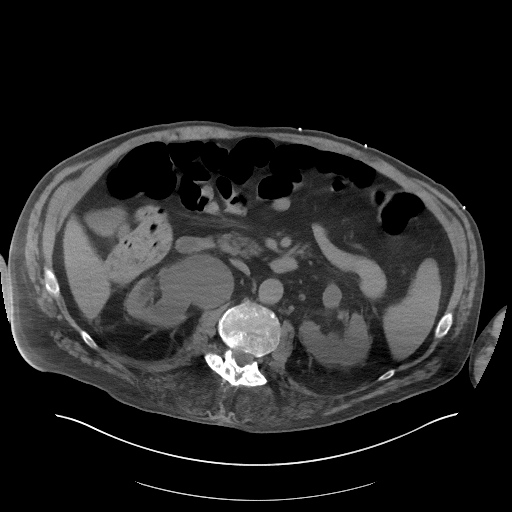
[im 78/108  soft-tissue]
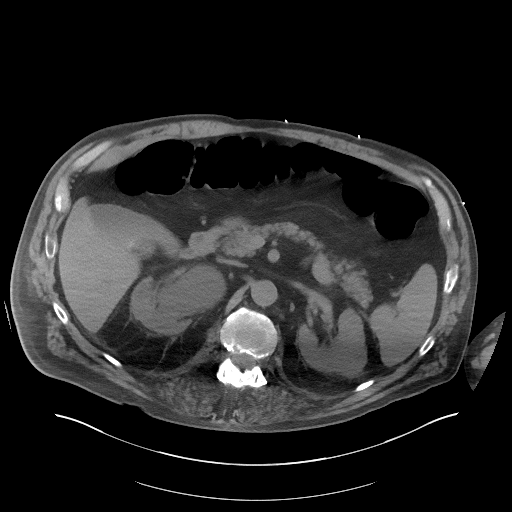
[im 84/108  soft-tissue]
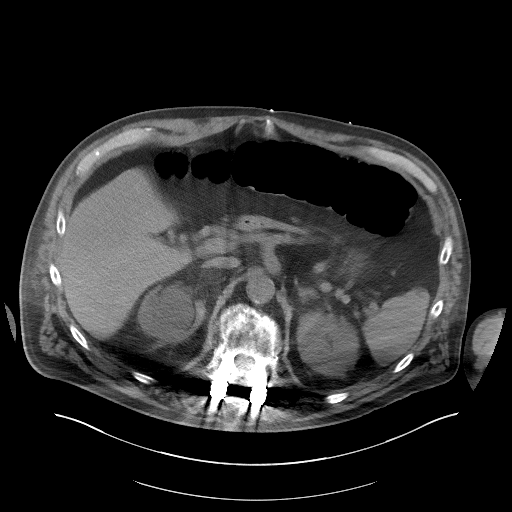
[im 96/108  soft-tissue]
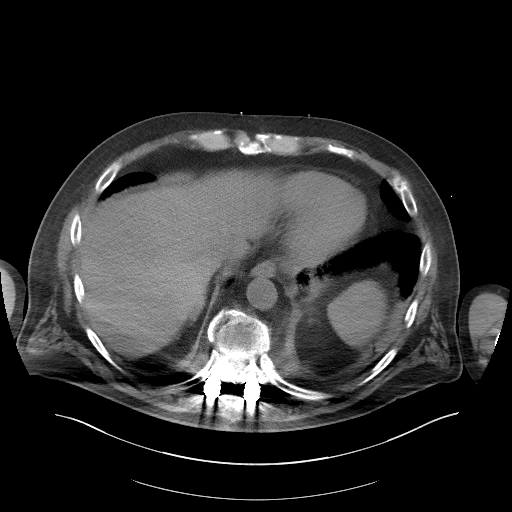
[im 102/108  soft-tissue]
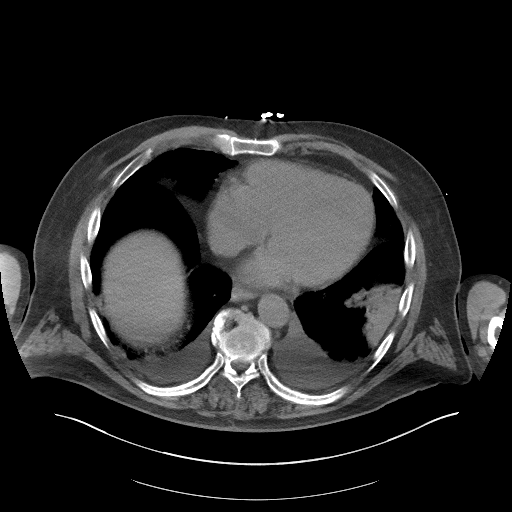

[Series 6: coronal st · coronal · 1.04mm/px · 3 of 104 slices shown]
[im 35/104  soft-tissue]
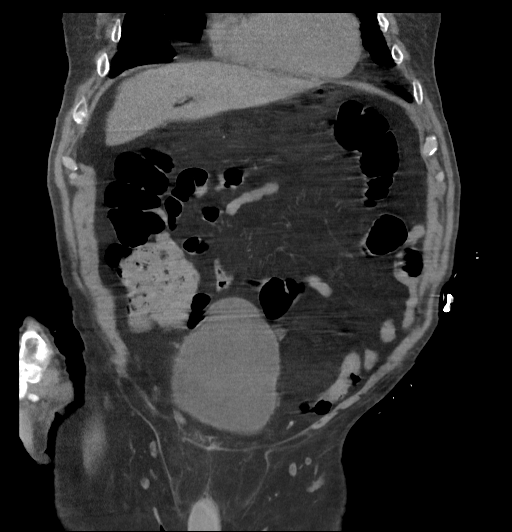
[im 46/104  soft-tissue]
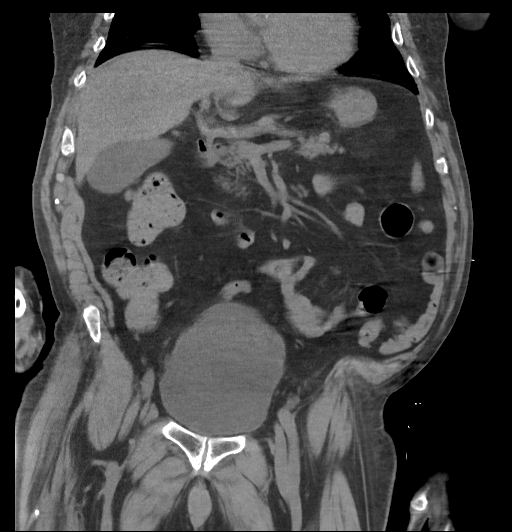
[im 58/104  soft-tissue]
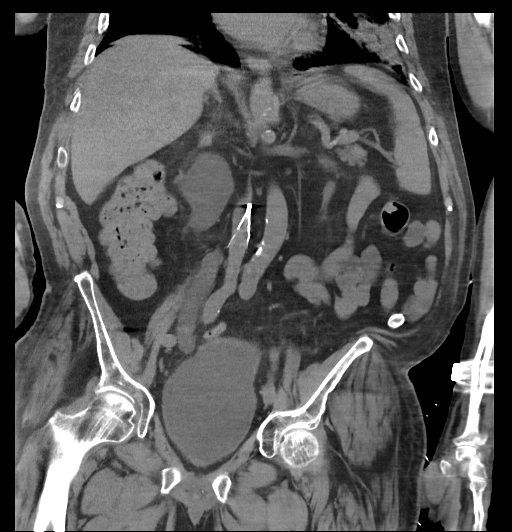

[17 of 46 positions shown; findings below may reference images not displayed]

FINDINGS: Lower chest: Small bilateral pleural effusions are noted. Stable
left lower lobe opacity is noted concerning for pneumonia.

Hepatobiliary: No focal liver abnormality is seen. No gallstones,
gallbladder wall thickening, or biliary dilatation.

Pancreas: Unremarkable. No pancreatic ductal dilatation or
surrounding inflammatory changes.

Spleen: Normal in size without focal abnormality.

Adrenals/Urinary Tract: Adrenal glands appear normal. Moderate to
severe right hydroureteronephrosis is noted without evidence of
obstructing calculus. Moderate right renal cortical atrophy is
noted. Transition zone is seen in the distal right ureter in the
pelvis concerning for stricture. Left kidney is unremarkable. Mild
urinary bladder distention is noted.

Stomach/Bowel: Stomach appears normal. There is no evidence of bowel
obstruction or inflammation.

Vascular/Lymphatic: Aortic atherosclerosis. No enlarged abdominal or
pelvic lymph nodes. IVC filter is noted in infrarenal position.

Reproductive: Prostate is unremarkable.

Other: No abdominal wall hernia or abnormality. No abdominopelvic
ascites.

Musculoskeletal: No acute or significant osseous findings.
IMPRESSION: Small bilateral pleural effusions are noted. Stable left lower lobe
opacity is noted concerning for pneumonia.

Moderate to severe right hydroureteronephrosis is noted without
evidence of obstructing calculus which is increased compared to
prior exam. Moderate right renal cortical atrophy is noted
suggesting longstanding obstruction. Transition zone is seen in the
distal right ureter in the pelvis concerning for stricture.

Aortic Atherosclerosis ([UD]-[UD]).

## 2021-09-23 SURGERY — ESOPHAGOGASTRODUODENOSCOPY (EGD) WITH PROPOFOL
Anesthesia: Monitor Anesthesia Care

## 2021-09-23 MED ORDER — METOPROLOL TARTRATE 50 MG PO TABS: 50.0000 mg | ORAL_TABLET | Freq: Two times a day (BID) | ORAL | Status: DC

## 2021-09-23 MED ORDER — DEXMEDETOMIDINE HCL IN NACL 200 MCG/50ML IV SOLN
0.0000 ug/kg/h | INTRAVENOUS | Status: DC
  Administered 2021-09-23: 1.1 ug/kg/h via INTRAVENOUS
  Administered 2021-09-23: 0.4 ug/kg/h via INTRAVENOUS
  Administered 2021-09-24: 0.2 ug/kg/h via INTRAVENOUS
  Administered 2021-09-24: 0.4 ug/kg/h via INTRAVENOUS
  Filled 2021-09-23: qty 50
  Filled 2021-09-23: qty 100
  Filled 2021-09-23: qty 50

## 2021-09-23 MED ORDER — DILTIAZEM LOAD VIA INFUSION
10.0000 mg | Freq: Once | INTRAVENOUS | Status: AC
Start: 2021-09-23 — End: 2021-09-23
  Administered 2021-09-23: 10 mg via INTRAVENOUS
  Filled 2021-09-23: qty 10

## 2021-09-23 MED ORDER — LORAZEPAM 2 MG/ML IJ SOLN
1.0000 mg | Freq: Once | INTRAMUSCULAR | Status: AC
  Administered 2021-09-23: 1 mg via INTRAVENOUS
  Filled 2021-09-23: qty 1

## 2021-09-23 MED ORDER — HEPARIN (PORCINE) 25000 UT/250ML-% IV SOLN
1900.0000 [IU]/h | INTRAVENOUS | Status: AC
  Administered 2021-09-23 – 2021-09-25 (×4): 2000 [IU]/h via INTRAVENOUS
  Administered 2021-09-25: 1900 [IU]/h via INTRAVENOUS
  Filled 2021-09-23 (×4): qty 250

## 2021-09-23 MED ORDER — MIDAZOLAM HCL 2 MG/2ML IJ SOLN
1.0000 mg | INTRAMUSCULAR | Status: AC | PRN
  Administered 2021-09-23 (×3): 1 mg via INTRAVENOUS
  Filled 2021-09-23 (×2): qty 2

## 2021-09-23 MED ORDER — CALCIUM GLUCONATE-NACL 1-0.675 GM/50ML-% IV SOLN
1.0000 g | Freq: Once | INTRAVENOUS | Status: AC
  Administered 2021-09-23: 1000 mg via INTRAVENOUS
  Filled 2021-09-23: qty 50

## 2021-09-23 MED ORDER — HYDRALAZINE HCL 20 MG/ML IJ SOLN
10.0000 mg | Freq: Four times a day (QID) | INTRAMUSCULAR | Status: DC | PRN
  Administered 2021-09-23 – 2021-09-24 (×3): 10 mg via INTRAVENOUS
  Filled 2021-09-23 (×3): qty 1

## 2021-09-23 MED ORDER — DEXTROSE-NACL 5-0.9 % IV SOLN: INTRAVENOUS | Status: DC

## 2021-09-23 MED ORDER — SIMETHICONE 40 MG/0.6ML PO SUSP
80.0000 mg | Freq: Four times a day (QID) | ORAL | Status: DC
Start: 2021-09-24 — End: 2021-09-24
  Filled 2021-09-23 (×4): qty 1.2

## 2021-09-23 MED ORDER — CLONIDINE HCL 0.3 MG/24HR TD PTWK
0.3000 mg | MEDICATED_PATCH | TRANSDERMAL | Status: DC
  Administered 2021-09-23 – 2021-09-30 (×2): 0.3 mg via TRANSDERMAL
  Filled 2021-09-23 (×3): qty 1

## 2021-09-23 MED ORDER — PROSOURCE TF PO LIQD
45.0000 mL | Freq: Two times a day (BID) | ORAL | Status: DC
  Administered 2021-09-23: 45 mL
  Filled 2021-09-23: qty 45

## 2021-09-23 MED ORDER — DOCUSATE SODIUM 50 MG/5ML PO LIQD
100.0000 mg | Freq: Two times a day (BID) | ORAL | Status: DC
  Administered 2021-09-23: 100 mg
  Filled 2021-09-23: qty 10

## 2021-09-23 MED ORDER — LACTULOSE 10 GM/15ML PO SOLN
10.0000 g | Freq: Two times a day (BID) | ORAL | Status: DC
  Filled 2021-09-23: qty 15

## 2021-09-23 MED ORDER — FENTANYL CITRATE (PF) 100 MCG/2ML IJ SOLN
100.0000 ug | Freq: Once | INTRAMUSCULAR | Status: AC
  Administered 2021-09-23: 100 ug via INTRAVENOUS

## 2021-09-23 MED ORDER — SIMETHICONE 40 MG/0.6ML PO SUSP
80.0000 mg | Freq: Four times a day (QID) | ORAL | Status: DC
Start: 2021-09-24 — End: 2021-09-23
  Filled 2021-09-23: qty 1.2

## 2021-09-23 MED ORDER — LEVOTHYROXINE SODIUM 50 MCG PO TABS
50.0000 ug | ORAL_TABLET | Freq: Every day | ORAL | Status: DC
  Administered 2021-09-24 – 2021-10-01 (×8): 50 ug
  Filled 2021-09-23 (×9): qty 1

## 2021-09-23 MED ORDER — LACTULOSE 10 GM/15ML PO SOLN
10.0000 g | Freq: Two times a day (BID) | ORAL | Status: DC
  Administered 2021-09-23: 10 g

## 2021-09-23 MED ORDER — DOCUSATE SODIUM 50 MG/5ML PO LIQD
100.0000 mg | Freq: Two times a day (BID) | ORAL | Status: DC
Start: 2021-09-24 — End: 2021-09-24

## 2021-09-23 MED ORDER — CIPROFLOXACIN HCL 0.3 % OP SOLN
2.0000 [drp] | OPHTHALMIC | Status: DC
  Administered 2021-09-23 – 2021-09-25 (×7): 2 [drp] via OPHTHALMIC
  Filled 2021-09-23 (×2): qty 2.5

## 2021-09-23 MED ORDER — VITAMIN B-12 100 MCG PO TABS
200.0000 ug | ORAL_TABLET | Freq: Every day | ORAL | Status: DC
  Filled 2021-09-23: qty 2

## 2021-09-23 MED ORDER — GABAPENTIN 250 MG/5ML PO SOLN
300.0000 mg | Freq: Three times a day (TID) | ORAL | Status: DC
  Filled 2021-09-23 (×3): qty 6

## 2021-09-23 MED ORDER — FERROUS SULFATE 300 (60 FE) MG/5ML PO SYRP
300.0000 mg | ORAL_SOLUTION | Freq: Every day | ORAL | Status: DC
Start: 2021-09-24 — End: 2021-09-24
  Filled 2021-09-23: qty 5

## 2021-09-23 MED ORDER — ETOMIDATE 2 MG/ML IV SOLN
20.0000 mg | Freq: Once | INTRAVENOUS | Status: AC
  Administered 2021-09-23: 20 mg via INTRAVENOUS

## 2021-09-23 MED ORDER — NICARDIPINE HCL IN NACL 20-0.86 MG/200ML-% IV SOLN
3.0000 mg/h | INTRAVENOUS | Status: DC
  Filled 2021-09-23: qty 200

## 2021-09-23 MED ORDER — NALOXEGOL OXALATE 12.5 MG PO TABS
12.5000 mg | ORAL_TABLET | Freq: Every day | ORAL | Status: DC
  Filled 2021-09-23 (×4): qty 1

## 2021-09-23 MED ORDER — MAGNESIUM SULFATE 2 GM/50ML IV SOLN
2.0000 g | Freq: Once | INTRAVENOUS | Status: AC
  Administered 2021-09-23: 2 g via INTRAVENOUS
  Filled 2021-09-23: qty 50

## 2021-09-23 MED ORDER — FINASTERIDE 5 MG PO TABS
5.0000 mg | ORAL_TABLET | Freq: Every day | ORAL | Status: DC
  Administered 2021-09-25 – 2021-10-01 (×7): 5 mg via ORAL
  Filled 2021-09-23 (×7): qty 1

## 2021-09-23 MED ORDER — MIDAZOLAM HCL 2 MG/2ML IJ SOLN
2.0000 mg | Freq: Once | INTRAMUSCULAR | Status: AC
  Administered 2021-09-23: 2 mg via INTRAVENOUS

## 2021-09-23 MED ORDER — SACCHAROMYCES BOULARDII 250 MG PO CAPS: 250.0000 mg | ORAL_CAPSULE | Freq: Two times a day (BID) | ORAL | Status: DC

## 2021-09-23 MED ORDER — MIDAZOLAM HCL 2 MG/2ML IJ SOLN
1.0000 mg | INTRAMUSCULAR | Status: DC | PRN
  Administered 2021-09-23 – 2021-09-24 (×2): 1 mg via INTRAVENOUS
  Filled 2021-09-23 (×3): qty 2

## 2021-09-23 MED ORDER — ATORVASTATIN CALCIUM 40 MG PO TABS: 80.0000 mg | ORAL_TABLET | Freq: Every day | ORAL | Status: DC

## 2021-09-23 MED ORDER — SODIUM CHLORIDE 0.9 % IV SOLN
1.0000 g | INTRAVENOUS | Status: DC
  Administered 2021-09-23: 1 g via INTRAVENOUS
  Filled 2021-09-23: qty 10

## 2021-09-23 MED ORDER — SENNOSIDES 8.8 MG/5ML PO SYRP
10.0000 mL | ORAL_SOLUTION | Freq: Two times a day (BID) | ORAL | Status: DC
Start: 2021-09-24 — End: 2021-09-24
  Filled 2021-09-23 (×2): qty 10

## 2021-09-23 MED ORDER — ORAL CARE MOUTH RINSE
15.0000 mL | OROMUCOSAL | Status: DC
  Administered 2021-09-23 – 2021-09-24 (×5): 15 mL via OROMUCOSAL

## 2021-09-23 MED ORDER — HYDRALAZINE HCL 50 MG PO TABS: 100.0000 mg | ORAL_TABLET | Freq: Three times a day (TID) | ORAL | Status: DC

## 2021-09-23 MED ORDER — DILTIAZEM HCL-DEXTROSE 125-5 MG/125ML-% IV SOLN (PREMIX)
5.0000 mg/h | INTRAVENOUS | Status: DC
  Administered 2021-09-23: 5 mg/h via INTRAVENOUS
  Filled 2021-09-23: qty 125

## 2021-09-23 MED ORDER — LACTATED RINGERS IV BOLUS
1000.0000 mL | Freq: Once | INTRAVENOUS | Status: AC
  Administered 2021-09-23: 1000 mL via INTRAVENOUS

## 2021-09-23 MED ORDER — CHLORHEXIDINE GLUCONATE 0.12% ORAL RINSE (MEDLINE KIT)
15.0000 mL | Freq: Two times a day (BID) | OROMUCOSAL | Status: DC
  Administered 2021-09-23: 15 mL via OROMUCOSAL

## 2021-09-23 MED ORDER — METOPROLOL TARTRATE 5 MG/5ML IV SOLN
10.0000 mg | Freq: Four times a day (QID) | INTRAVENOUS | Status: DC | PRN
  Administered 2021-09-24: 10 mg via INTRAVENOUS
  Filled 2021-09-23: qty 10

## 2021-09-23 MED ORDER — ROCURONIUM BROMIDE 10 MG/ML (PF) SYRINGE
100.0000 mg | PREFILLED_SYRINGE | Freq: Once | INTRAVENOUS | Status: AC
  Administered 2021-09-23: 100 mg via INTRAVENOUS

## 2021-09-23 MED ORDER — LEVOTHYROXINE SODIUM 50 MCG PO TABS: 50.0000 ug | ORAL_TABLET | Freq: Every day | ORAL | Status: DC

## 2021-09-23 MED ORDER — FAMOTIDINE IN NACL 20-0.9 MG/50ML-% IV SOLN: 20.0000 mg | Freq: Two times a day (BID) | INTRAVENOUS | Status: DC

## 2021-09-23 MED ORDER — AMLODIPINE BESYLATE 10 MG PO TABS: 10.0000 mg | ORAL_TABLET | Freq: Every day | ORAL | Status: DC

## 2021-09-23 MED ORDER — VITAL HIGH PROTEIN PO LIQD
1000.0000 mL | ORAL | Status: DC
  Administered 2021-09-23: 1000 mL

## 2021-09-23 MED ORDER — METOPROLOL TARTRATE 5 MG/5ML IV SOLN
5.0000 mg | Freq: Three times a day (TID) | INTRAVENOUS | Status: DC
  Administered 2021-09-23: 5 mg via INTRAVENOUS
  Filled 2021-09-23: qty 5

## 2021-09-23 MED ORDER — FENTANYL CITRATE (PF) 100 MCG/2ML IJ SOLN
25.0000 ug | INTRAMUSCULAR | Status: DC | PRN
  Administered 2021-09-23 – 2021-09-24 (×2): 100 ug via INTRAVENOUS
  Filled 2021-09-23 (×2): qty 2

## 2021-09-23 MED ORDER — POLYETHYLENE GLYCOL 3350 17 G PO PACK
17.0000 g | PACK | Freq: Every day | ORAL | Status: DC
  Administered 2021-09-23: 17 g
  Filled 2021-09-23: qty 1

## 2021-09-23 MED ORDER — ACETAMINOPHEN 160 MG/5ML PO SOLN: 650.0000 mg | Freq: Four times a day (QID) | ORAL | Status: DC | PRN

## 2021-09-23 MED ORDER — ALLOPURINOL 100 MG PO TABS: 100.0000 mg | ORAL_TABLET | Freq: Every day | ORAL | Status: DC

## 2021-09-23 MED ORDER — FENTANYL CITRATE (PF) 100 MCG/2ML IJ SOLN: 25.0000 ug | INTRAMUSCULAR | Status: DC | PRN

## 2021-09-23 MED ORDER — METOPROLOL TARTRATE 5 MG/5ML IV SOLN
10.0000 mg | Freq: Three times a day (TID) | INTRAVENOUS | Status: DC
  Administered 2021-09-23: 10 mg via INTRAVENOUS
  Filled 2021-09-23: qty 10

## 2021-09-23 MED ORDER — ACETAMINOPHEN 650 MG RE SUPP: 650.0000 mg | Freq: Four times a day (QID) | RECTAL | Status: DC | PRN

## 2021-09-23 MED ORDER — METOPROLOL TARTRATE 5 MG/5ML IV SOLN
5.0000 mg | INTRAVENOUS | Status: AC
  Administered 2021-09-23: 5 mg via INTRAVENOUS
  Filled 2021-09-23: qty 5

## 2021-09-23 MED ORDER — LORAZEPAM 2 MG/ML IJ SOLN: 0.5000 mg | Freq: Once | INTRAMUSCULAR | Status: DC

## 2021-09-23 NOTE — Progress Notes (Signed)
   09/22/21 2339  Notify: Provider  Provider Name/Title Jeannette Corpus  Date Provider Notified 09/22/21  Time Provider Notified 2330  Notification Type Page  Notification Reason Change in status (patient is very agitated, BP high 179/123 after IV hydralazine 177/103 after IV  lopressor 172/107)  Provider response No new orders  Date of Provider Response 09/22/21  Time of Provider Response  (she said, call rapid)  Notify: Rapid Response  Name of Rapid Response RN Notified Barnet Glasgow RN  Date Rapid Response Notified 09/22/21  Time Rapid Response Notified 2339

## 2021-09-23 NOTE — Progress Notes (Signed)
RN at bedside to check on patient. BP is 160's/110's. This RN administered IV hydralazine per MD orders. Also contacted Dr Nevada Crane to update her on .Thomas [name] being a little sleepy right now. His BP was 168/110/s. After IV hydralazine his BP is 156/107. His HR remains 130-140's. He did not flinch when lab stuck him for blood. I sternal rub him he will wake up but goes back to sleep shortly after. I am holding the ativan for now on his MAR that was scheduled for 1530. Radiology called me and said with his aggressive behaviors they will not attempt NGT at this time. RN will continue to monitor.

## 2021-09-23 NOTE — Progress Notes (Signed)
PT Cancellation Note  Patient Details Name: [facility] LIPTON MRN: [id number] DOB: [birth year]   Cancelled Treatment:    Reason Eval/Treat Not Completed: Medical issues which prohibited therapy (confused, agitated, transferred to progressive unit (elevated BP, HR))   Kati L Payson 09/23/2021, 1:42 PM Arlyce Dice, DPT Acute [facility] Services Pager: [phone] Office: [phone]

## 2021-09-23 NOTE — Progress Notes (Signed)
This RN at bedside with Dr. Lake Bells. Pt to be transferred to ICU for intubation per MD. This RN and charge RN Marissa moved pt directly to ICU for closer monitoring and treatment. Report given to ICU RN Candace.

## 2021-09-23 NOTE — Progress Notes (Addendum)
BP and pulse constantly high throughout the night ( please see flow sheet) PRN IV hydralazine and metoprolol administered.On call provider  X. Blount notified several times, no new order noted except bilateral soft restrain for agitation.Patient is confuse and refusing PO medicines, and mouth care, RRT notified.

## 2021-09-23 NOTE — Progress Notes (Signed)
RN contacted patients brother, BJ Genova to notify of ICU transfer. All questions addressed.

## 2021-09-23 NOTE — Progress Notes (Signed)
MD notified this RN that she is placing orders to move pt to ICU.

## 2021-09-23 NOTE — Progress Notes (Signed)
   09/23/21 0909  Assess: MEWS Score  Temp 98.3 F (36.8 C)  BP (!) 188/135  Pulse Rate (!) 135  Resp (!) 21  SpO2 95 %  Assess: MEWS Score  MEWS Temp 0  MEWS Systolic 0  MEWS Pulse 3  MEWS RR 1  MEWS LOC 0  MEWS Score 4  MEWS Score Color Red  Assess: if the MEWS score is Yellow or Red  Were vital signs taken at a resting state? Yes  Focused Assessment Change from prior assessment (see assessment flowsheet)  Does the patient meet 2 or more of the SIRS criteria? No  MEWS guidelines implemented *See Row Information* Yes  Take Vital Signs  Increase Vital Sign Frequency  Red: Q 1hr X 4 then Q 4hr X 4, if remains red, continue Q 4hrs  Escalate  MEWS: Escalate Red: discuss with charge nurse/RN and provider, consider discussing with RRT  Notify: Charge Nurse/RN  Name of Charge Nurse/RN Notified Regina RN  Date Charge Nurse/RN Notified 09/23/21  Time Charge Nurse/RN Notified 0915  Notify: Provider  Provider Name/Title Dr. Nevada Crane  Date Provider Notified 09/23/21  Time Provider Notified [phone]  Notification Type Face-to-face  Notification Reason Change in status  Provider response At bedside;See new orders  Date of Provider Response 09/23/21  Time of Provider Response 0920  Notify: Rapid Response  Name of Rapid Response RN Notified Sarah RN  Date Rapid Response Notified 09/23/21  Time Rapid Response Notified 0935  Document  Patient Outcome Not stable and remains on department (transfer order in)  Progress note created (see row info) Yes  Assess: SIRS CRITERIA  SIRS Temperature  0  SIRS Pulse 1  SIRS Respirations  1  SIRS WBC 0  SIRS Score Sum  2

## 2021-09-23 NOTE — Progress Notes (Signed)
Rn increased pt dilt gtt to 30ml/hr per order parameters. Notified Dr. Nevada Crane that pt's HR remains 120-130's. Pt still lethargic but does respond to voice and pain. Pt continues to be RED MEWS. MD on her way to bedside.

## 2021-09-23 NOTE — Progress Notes (Signed)
RN attempted NGT with 4 other people trying to hold patient down. Pt aggressively shakes his head back and forth. NGT unsuccessful. MD Nevada Crane notified.

## 2021-09-23 NOTE — Progress Notes (Signed)
[facility] Progress Note Patient Name: Thomas [name] DOB: [birth year] MRN: [id number]   Date of Service  09/23/2021  HPI/Events of Note  Patient transferred from the medical floor where he was being treated for altered mental status, pneumonia, cellulitis, hypertension, and DVT to the ICU secondary to worsened mental status, hypertensive urgency, and inability to protect his airway, he is now intubated and on the ventilator, MRI brain is pending, and he is on a Cardene gtt.  eICU Interventions  New Patient Evaluation.        [name] Avalyn Molino 09/23/2021, 7:35 PM

## 2021-09-23 NOTE — Consult Note (Signed)
NAME:  Thomas [name], MRN:  [id number], DOB:  08/04/1951, LOS: 6 ADMISSION DATE:  09/16/2021, CONSULTATION DATE:  12/5 REFERRING MD:  [place] Crane, CHIEF COMPLAINT:  Confusion   History of Present Illness:  70 y/o male with multiple medical problems was admitted on 11/28 after he was found to be confused after not eating several days.  He was brought to the [facility] ER was found to have hypoglycemia, evidence of AKI, RLL pneumonia, cellulitis, and a DVT.  He was admitted and treated for the same.  After admission he has developed worsening confusion, agitation, and hypertension.  Plans were made today for an MRI of his brain but he was unable to lie flat long enough to tolerate that.  His blood pressure has been treated with a continuous infusion of diltiazem.  Pulmonary and critical care medicine was consulted late in the day shift of 12/5 after he had been confused, hypertensive and combative for hours with intermittent unresponsiveness.  The patient was unresponsive and unable to provide history.  I tried calling his brother and he did not answer.  History was obtained by chart review.  Pertinent  Medical History  BPH Chronic pain Chronic kidney disease Diabetes mellitus type 2 Gout Hypertension Physical deconditioning Arthritis  Significant Hospital Events: Including procedures, antibiotic start and stop dates in addition to other pertinent events   11/28 admission for pneumonia, cellulitis, DVT 11/29 lower extremity Doppler ultrasound age-indeterminate left femoral vein DVT 11/29 MRI left foot: Findings most likely consistent with severe arthritis though early osteomyelitis is possible.  Diffuse swelling on exam no evidence of abscess 11/29 thoracic spine MRI postoperative changes with posterior fusion hardware, no complicating features, chronic changes T10-11 findings not suspicious for discitis or osteomyelitis, T8-9 mild flattening of the thecal sac with disc protrusion, 12/5 moved to ICU for  hypertension, encephalopathy, agitation, intermittent periods of unresponsiveness 12/5 CT abdomen pelvis small bilateral pleural effusions noted, stable left lower lobe opacity concerning for pneumonia, moderate to severe right hydroureteronephrosis is noted without evidence of obstructing calculus.  Increased compared to prior exam.,  Concerning for ureteral stricture 12/5 CT head Motion limited study with new hypoattenuation left occipital lobe, could represent infarct, acute infarct is not included, MRI would be helpful  Antibiotics: Ceftriaxone 11/28 through 12/3 Azithromycin 11/28 through 12/3 Vancomycin 11/28x1 Ceftriaxone 12/5 x 1  Interim History / Subjective:  As above   Objective   Blood pressure (!) 160/115, pulse (!) 131, temperature 98.9 F (37.2 C), temperature source Axillary, resp. rate (!) 24, height 6\' 6"  (1.981 m), weight 121.3 kg, SpO2 92 %.        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, chronic venous stasis changes left lower extremity Neuro: obtunded, moans, no purposeful movements   Resolved Hospital Problem list     Assessment & Plan:  Acute metabolic encephalopathy: Concern for hypertensive emergency/PRES Inability to protect airway Intubate now for airway protection Full mechanical ventilatory support PAD protocol titrated to RASS score of -1 to -2  using Precedex, as needed fentanyl, as needed Versed MRI brain  Acute respiratory failure with hypoxemia due to inability to protect airway As  above  Sinus tach: appears volume deplete on exam Place second peripheral IV Bolus 1 L NS over next 2 hours  Community-acquired pneumonia: Has been treated with antibiotics Monitor off of antibiotics  Acute kidney injury:-Improved: Continue KVO fluids Monitor BMET and UOP Replace electrolytes as needed  Hypertensive emergency: Change diltiazem to Cardene infusion, titrated to systolic blood pressure less than 160  DVT left femoral vein Heparin infusion  Right hydroureter nephrosis seen on CT scan Follow-up urology recommendation  Cellulitis left foot Monitor off of antibiotics  Nausea with normocytic anemia, history of esophagitis: GI medicine was planning for endoscopy. Will discuss with them if they still plan on performing this Continue heparin infusion for now PPI twice daily  Morbid obesity Physical deconditioning, severe   Best Practice (right click and "Reselect all SmartList Selections" daily)   Diet/type: tubefeeds DVT prophylaxis: systemic heparin GI prophylaxis: PPI Lines: N/A Foley:  N/A Code Status:  full code Last date of multidisciplinary goals of care discussion [attempted to call his brother on 12/5, no answer]  Labs   CBC: Recent Labs  Lab 09/16/21 2245 09/18/21 0934 09/20/21 0613 09/22/21 0957 09/23/21 0952  WBC 7.0 5.2 5.6 7.4 12.6*  NEUTROABS 5.7  --   --  5.8 11.1*  HGB 8.7* 8.2* 9.1* 10.3* 11.7*  HCT 28.2* 26.3* 29.1* 31.6* 36.8*  MCV 101.8* 100.4* 100.0 96.3 95.6  PLT 228 213 238 266 379    Basic Metabolic Panel: Recent Labs  Lab 09/16/21 2245 09/18/21 0934 09/19/21 0506 09/20/21 0613 09/21/21 0700 09/22/21 0957 09/23/21 0952  NA 135 140  --  138  --  140 139  K 4.3 4.7  --  4.9  --  4.0 3.9  CL 104 113*  --  109  --  109 110  CO2 20* 21*  --  22  --  25 17*  GLUCOSE 86 106*  --  111*  --  109* 181*  BUN 73* 58*  --  30*  --  24* 28*  CREATININE 4.62* 3.51* 2.86* 2.35*  2.33* 2.22* 2.43* 2.54*  CALCIUM 7.3* 7.1*   --  7.5*  --  7.5* 7.6*  MG  --   --   --   --   --  1.4* 1.7  PHOS  --   --   --  3.8  --  3.3 3.8   GFR: Estimated Creatinine Clearance: 40.7 mL/min (A) (by C-G formula based on SCr of 2.54 mg/dL (H)). Recent Labs  Lab 09/16/21 2245 09/18/21 0934 09/20/21 0613 09/22/21 0957 09/23/21 0952 09/23/21 1542  PROCALCITON  --  0.12  --  0.10  --  0.18  WBC 7.0 5.2 5.6 7.4 12.6*  --   LATICACIDVEN 0.8  --   --   --  1.3  --     Liver Function Tests: Recent Labs  Lab 09/16/21 2245 09/20/21 0613 09/22/21 0957 09/23/21 0952  AST 26  --  42* 49*  ALT 11  --  17 20  ALKPHOS 82  --  88 94  BILITOT 0.5  --  0.6 1.1  PROT 8.2*  --  7.1 7.9  ALBUMIN 3.1* 2.8* 3.0* 3.3*   Recent Labs  Lab 09/22/21 0957  LIPASE 32   Recent Labs  Lab 09/23/21 1542  AMMONIA 22    ABG    Component  Value Date/Time   PHART 7.533 (H) 09/23/2021 1120   PCO2ART 24.2 (L) 09/23/2021 1120   PO2ART 74.5 (L) 09/23/2021 1120   HCO3 20.3 09/23/2021 1120   ACIDBASEDEF 0.8 09/23/2021 1120   O2SAT 96.0 09/23/2021 1120     Coagulation Profile: No results for input(s): INR, PROTIME in the last 168 hours.  Cardiac Enzymes: No results for input(s): CKTOTAL, CKMB, CKMBINDEX, TROPONINI in the last 168 hours.  HbA1C: Hgb A1c MFr Bld  Date/Time Value Ref Range Status  09/20/2021 06:13 AM 6.0 (H) 4.8 - 5.6 % Final    Comment:    (NOTE) Pre diabetes:          5.7%-6.4%  Diabetes:              >6.4%  Glycemic control for   <7.0% adults with diabetes   06/03/2021 03:54 AM 5.9 (H) 4.8 - 5.6 % Final    Comment:    (NOTE) Pre diabetes:          5.7%-6.4%  Diabetes:              >6.4%  Glycemic control for   <7.0% adults with diabetes     CBG: Recent Labs  Lab 09/23/21 0416 09/23/21 0833 09/23/21 1238 09/23/21 1503 09/23/21 1711  GLUCAP 152* 274* 148* 157* 167*    Review of Systems:   Cannot obtain due to confusion  Past Medical History:  He,  has a past medical history of Arthritis,  BPH (benign prostatic hyperplasia) (11/24/2019), Chronic pain syndrome (11/24/2019), CKD (chronic kidney disease), stage IV ([place]) (11/24/2019), Diabetes mellitus without complication ([place]), Gout, Hypertension, Muscle pain, Physical deconditioning (11/24/2019), Scars, and Swelling.   Surgical History:   Past Surgical History:  Procedure Laterality Date   BELOW KNEE LEG AMPUTATION Right    ESOPHAGOGASTRODUODENOSCOPY (EGD) WITH PROPOFOL N/A 11/25/2019   Procedure: ESOPHAGOGASTRODUODENOSCOPY (EGD) WITH PROPOFOL;  Surgeon: Carol Ada, MD;  Location: [place];  Service: Endoscopy;  Laterality: N/A;   IR FLUORO GUIDE CV LINE RIGHT  09/05/2019   IR LUMBAR DISC ASPIRATION W/IMG GUIDE  11/26/2019   IR US GUIDE VASC ACCESS RIGHT  09/05/2019   RADIOLOGY WITH ANESTHESIA N/A 11/25/2019   Procedure: Thoracic and Lumbar MRI WITH ANESTHESIA;  Surgeon: Radiologist, Medication, MD;  Location: [place];  Service: Radiology;  Laterality: N/A;     Social History:   reports that he has never smoked. He has never been exposed to tobacco smoke. He has never used smokeless tobacco. He reports that he does not drink alcohol and does not use drugs.   Family History:  His Family history is unknown by patient.   Allergies Allergies  Allergen Reactions   Other Other (See Comments)    Blood pressure issues Per [facility] hospital: Pt states he can only take these pain meds or else he gets very sick: Oxycontin,morphine,demerol, and dilaudid are the only pain meds pt states he can take.     Oxymorphone Other (See Comments)    Causes kidney problems   Aspirin Nausea And Vomiting    Per [facility]   Beta Vulgaris Nausea And Vomiting    Beets   Buspirone Other (See Comments)    Unknown reaction   Cabbage Nausea And Vomiting   Codeine Other (See Comments)    unknown   Dolobid [Diflunisal]     Unknown reaction   Fish Allergy Nausea And Vomiting   Fish-Derived Products Nausea And Vomiting   Methadone     Unknown  reaction  Pentazocine Other (See Comments)    Unknown reaction   Propoxyphene Other (See Comments)    Unknown reaction   Shellfish Allergy Nausea And Vomiting   Sulfa Antibiotics Hives   Sulfasalazine Other (See Comments)    Unknown reaction   Vistaril [Hydroxyzine]     Unknown reaction   Amoxicillin Rash    Per [facility] Medications  Prior to Admission medications   Medication Sig Start Date End Date Taking? Authorizing Provider  acetaminophen (TYLENOL) 500 MG tablet Take 2 tablets (1,000 mg total) by mouth every 8 (eight) hours. Patient taking differently: Take 1,000 mg by mouth every 8 (eight) hours as needed for moderate pain. 10/03/20  Yes Jeralyn Bennett, MD  allopurinol (ZYLOPRIM) 100 MG tablet Take 100 mg by mouth daily.    Yes [provider]  amLODipine (NORVASC) 5 MG tablet Take 5 tablets by mouth daily.   Yes [provider]  apixaban (ELIQUIS) 5 MG TABS tablet Take 1 tablet (5 mg total) by mouth 2 (two) times daily. 10/22/21 12/21/21 Yes Hall, Lorenda Cahill, DO  APIXABAN Arne [place]) VTE STARTER PACK (10MG  AND 5MG ) Take as directed on package: start with two-5mg  tablets twice daily for 7 days. On day 8, switch to one-5mg  tablet twice daily. 09/21/21  Yes Irene Pap N, DO  atorvastatin (LIPITOR) 80 MG tablet Take 1 tablet (80 mg total) by mouth daily. 09/25/20  Yes Jose Persia, MD  colchicine 0.6 MG tablet Take 0.6 mg by mouth daily. 08/21/21  Yes [provider]  finasteride (PROSCAR) 5 MG tablet Take 1 tablet (5 mg total) by mouth daily. 10/10/19  Yes Hosie Poisson, MD  glipiZIDE (GLUCOTROL) 10 MG tablet Take 10 mg by mouth daily before breakfast.   Yes [provider]  hydrALAZINE (APRESOLINE) 100 MG tablet Take 1 tablet (100 mg total) by mouth 3 (three) times daily. 09/24/20  Yes Jose Persia, MD  lactulose (CHRONULAC) 10 GM/15ML solution Take 15 mLs (10 g total) by mouth 2 (two) times daily. 09/24/20  Yes Jose Persia, MD   levofloxacin (LEVAQUIN) 500 MG tablet Take 500 mg by mouth. Every 48 hours 09/13/21  Yes [provider]  levothyroxine (SYNTHROID) 50 MCG tablet Take 50 mcg by mouth daily before breakfast.    Yes [provider]  methocarbamol (ROBAXIN) 750 MG tablet Take 1 tablet (750 mg total) by mouth every 8 (eight) hours as needed for muscle spasms. 06/06/21  Yes Nita Sells, MD  naloxegol oxalate (MOVANTIK) 25 MG TABS tablet Take 1 tablet (25 mg total) by mouth daily. 09/25/20  Yes Jose Persia, MD  OXYCONTIN 30 MG 12 hr tablet Take 1 tablet (30 mg total) by mouth 2 (two) times daily. Patient taking differently: Take 30 mg by mouth every 8 (eight) hours. 06/06/21  Yes Nita Sells, MD  tamsulosin (FLOMAX) 0.4 MG CAPS capsule Take 1 capsule (0.4 mg total) by mouth daily. 09/25/20  Yes Jose Persia, MD  vitamin B-12 (CYANOCOBALAMIN) 250 MCG tablet Take 250 mcg by mouth daily. 05/12/21  Yes [provider]  cephALEXin (KEFLEX) 500 MG capsule Take 1 capsule (500 mg total) by mouth every 8 (eight) hours for 5 days. 09/21/21 09/26/21  Kayleen Memos, DO  cloNIDine (CATAPRES) 0.1 MG tablet Take 1 tablet (0.1 mg total) by mouth 2 (two) times daily. 09/21/21 10/21/21  Kayleen Memos, DO  clotrimazole (LOTRIMIN) 1 % cream Apply topically 2 (two) times daily. Patient not taking: Reported on 09/17/2021 06/06/21  Nita Sells, MD  doxycycline (VIBRA-TABS) 100 MG tablet Take 1 tablet (100 mg total) by mouth every 12 (twelve) hours for 5 days. 09/21/21 09/26/21  Kayleen Memos, DO  ferrous sulfate 325 (65 FE) MG tablet Take 1 tablet (325 mg total) by mouth daily with breakfast. 09/21/21 12/20/21  Kayleen Memos, DO  furosemide (LASIX) 40 MG tablet Take 40 mg by mouth daily. 09/03/21   [provider]  gabapentin (NEURONTIN) 300 MG capsule Take 1 capsule (300 mg total) by mouth at bedtime. Patient taking differently: Take 300 mg by mouth every 8 (eight) hours. 06/06/21    Nita Sells, MD  metoprolol tartrate (LOPRESSOR) 100 MG tablet Take 0.5 tablets (50 mg total) by mouth 2 (two) times daily. 09/21/21 10/21/21  Kayleen Memos, DO  saccharomyces boulardii (FLORASTOR) 250 MG capsule Take 1 capsule (250 mg total) by mouth 2 (two) times daily for 9 days. 09/21/21 09/30/21  Kayleen Memos, DO  ZOFRAN 4 MG tablet Take 4 mg by mouth every 8 (eight) hours as needed for nausea or vomiting.  Patient not taking: Reported on 09/17/2021 02/06/20   [provider]     Critical care time: 40 minutes    Roselie Awkward, MD [place] [facility] Pager: [phone] Cell: [phone] After 7:00 pm call Elink  [phone]

## 2021-09-23 NOTE — Progress Notes (Signed)
[place] for lovenox --> Eliquis > Heparin gtt Indication: DVT  Patient Measurements: Height: 6\' 6"  (198.1 cm) Weight: 121.3 kg (267 lb 6.7 oz) IBW/kg (Calculated) : 91.4 Heparin Dosing Weight: 116 kg  Vital Signs: Temp: 98.7 F (37.1 C) (12/05 1017) Temp Source: Oral (12/05 1017) BP: 165/114 (12/05 1017) Pulse Rate: 115 (12/05 1017)  Labs: Recent Labs    09/21/21 0700 09/22/21 0957 09/22/21 2007 09/23/21 0952  HGB  --  10.3*  --  11.7*  HCT  --  31.6*  --  36.8*  PLT  --  266  --  339  APTT  --   --  58*  --   HEPARINUNFRC  --   --  >1.10*  --   CREATININE 2.22* 2.43*  --  2.54*    Estimated Creatinine Clearance: 40.7 mL/min (A) (by C-G formula based on SCr of 2.54 mg/dL (H)).  Assessment: Patient's a 40 M presented to the ED on 11/28 with c/o LE swelling and pain. LE doppler on 11/29 showed age indeterminate DVT of L femoral vein. Pt was initially anticoagulated with LMWH; transitioned to apixaban; now on heparin drip per pharmacy dosing due to inability to take PO mediations and need for EGD.   Today, 09/23/21 Heparin drip was stopped at 0800 this morning per GI instructions for planned EGD. EGD has since been cancelled Monitoring heparin using aPTT as HL falsely elevated due to recent DOAC CBC: Hgb stable, Plt WNL   Plan:  MD would like to resume heparin drip since EGD cancelled. Will resume heparin at previous rate of 2000 units/hr Check 6 hour HL/aPTT. Once aPTT and HL correlate, can dose adjust using HL alone Monitor for signs of bleeding  If EGD rescheduled, will need guidance from GI on when to hold heparin pre-procedure.   Lenis Noon, PharmD 09/23/21 11:48 AM

## 2021-09-23 NOTE — Progress Notes (Signed)
Received a call from bedside RN from progressive unit due to the patient's having uncontrolled hypertension and tachycardia while on Cardizem drip.  Also hyperventilating using accessory muscles to breathe.  Came to bedside.  The patient is maxed out on Cardizem drip and severely hypertensive and tachycardic.  He is alert and confused.  Does not answer questions and does not follow commands.  Two-point restraints in place.  ABG done earlier shows concern for respiratory alkalosis.  PCCM consulted to assist with the management.  Patient will be transferred to ICU, higher level of care.

## 2021-09-23 NOTE — Progress Notes (Signed)
[place] for lovenox --> Eliquis > Heparin gtt Indication: DVT  Patient Measurements: Height: 6\' 6"  (198.1 cm) Weight: 121.3 kg (267 lb 6.7 oz) IBW/kg (Calculated) : 91.4 Heparin Dosing Weight: 116 kg  Vital Signs: Temp: 98.9 F (37.2 C) (12/05 1859) Temp Source: Axillary (12/05 1859) BP: 196/129 (12/05 1931) Pulse Rate: 139 (12/05 1931)  Labs: Recent Labs    09/21/21 0700 09/22/21 0957 09/22/21 2007 09/23/21 0952  HGB  --  10.3*  --  11.7*  HCT  --  31.6*  --  36.8*  PLT  --  266  --  339  APTT  --   --  58*  --   HEPARINUNFRC  --   --  >1.10*  --   CREATININE 2.22* 2.43*  --  2.54*   Estimated Creatinine Clearance: 40.7 mL/min (A) (by C-G formula based on SCr of 2.54 mg/dL (H)).  Assessment: Patient's a 43 M presented to the ED on 11/28 with c/o LE swelling and pain. LE doppler on 11/29 showed age indeterminate DVT of L femoral vein. Pt was initially anticoagulated with LMWH; transitioned to apixaban; now on heparin drip per pharmacy dosing due to inability to take PO mediations and need for EGD.   Today, 09/23/21 Heparin level therapeutic at 0.47, aPTT subtherapeutic at 39 seconds Now that aPTT and HL are correlating, will proceed with HL checks only  CBC: Hgb stable, Plt WNL   Plan:  Continue heparin at 2000 units/hr F/u 6 hour confirmatory HL Monitor for signs of bleeding  If EGD rescheduled, will need guidance from GI on when to hold heparin pre-procedure.   Dimple Nanas, PharmD 09/23/2021 7:49 PM

## 2021-09-23 NOTE — Progress Notes (Signed)
[facility] Progress Note  Thomas [name] 70 y.o. 08/26/1951  CC:  Nausea and vomiting   Subjective: Patient laying in hospital bed, groaning with labored breathing. NT in room taking vitals. Tachycardic in 130s. BP elevated 167/100.   ROS : Review of Systems  Unable to perform ROS: Mental status change     Objective: Vital signs in last 24 hours: Vitals:   09/23/21 0534 09/23/21 0909  BP: (!) 177/114 (!) 188/135  Pulse: (!) 113 (!) 135  Resp:  (!) 21  Temp:  98.3 F (36.8 C)  SpO2: 94% 95%    Physical Exam:  General:  Frail appearing, not alert and oriented, not cooperative  Head:  Normocephalic, without obvious abnormality, atraumatic  Eyes:  Anicteric sclera, EOM's intact, conjunctival pallor.  Lungs:   Clear to auscultation bilaterally, respirations labored/shallow  Heart:  Tachycardic, regular rhythm.  Abdomen:   Soft, non-tender, bowel sounds active all four quadrants,  no masses,   Extremities: Below knee amputation R leg.    Lab Results: Recent Labs    09/21/21 0700 09/22/21 0957  NA  --  140  K  --  4.0  CL  --  109  CO2  --  25  GLUCOSE  --  109*  BUN  --  24*  CREATININE 2.22* 2.43*  CALCIUM  --  7.5*  MG  --  1.4*  PHOS  --  3.3   Recent Labs    09/22/21 0957  AST 42*  ALT 17  ALKPHOS 88  BILITOT 0.6  PROT 7.1  ALBUMIN 3.0*   Recent Labs    09/22/21 0957  WBC 7.4  NEUTROABS 5.8  HGB 10.3*  HCT 31.6*  MCV 96.3  PLT 266   No results for input(s): LABPROT, INR in the last 72 hours.    Assessment Persistent intractable nausea and vomiting - nonobstructive bowel gas pattern on abdominal xray 12/03 - History of LA grade A esophagitis seen on EGD 11/25/2019 by Dr. Hung (done for coffee ground emesis) - Ast 42/ ALT 17/ alk phos 88 - Lipase 32 - CT abdomen pelvis wo contrast ordered  Normocytic anemia - HGB 10.3, MCV 96.3  AKI - BUN 24, Cr 2.43, GFR 28  Acute metabolic encephalopathy s  Left lower extremity  swelling concerning for cellulitis.  Plan: Patient remains unstable; tachycardic and hypertensive along with decreased respiratory status. Per RN notes, patient was excessive agitated and now in soft restraints as a result.  With patient being unstable, EGD today was cancelled. We will see him tomorrow and reassess stability and try to reschedule EGD.  Continue Protonix 40mg IV BID  [facility] will follow.   M  PA-C 09/23/2021, 9:33 AM  Contact #  [phone]  

## 2021-09-23 NOTE — Progress Notes (Addendum)
Updated Dr. [place] Crane that pt's dilt gtt has been running for 30 minutes at 57, I just increased him to 80ml/hr. His BP is 174/117 HR still ST 130-140's. Dr [place] Crane aware pt continues to be a red mews 4-6.

## 2021-09-23 NOTE — Progress Notes (Addendum)
Updated the patient's brother BJ who is also his medical power of attorney via phone.  He is aware that the patient is being moved to the ICU and requests to be kept updated.  He can be reached at:   [place], [place].

## 2021-09-23 NOTE — Procedures (Signed)
Intubation Procedure Note  Thomas [name]  [id number]  1950/12/29  Date:09/23/21  Time:6:47 PM   Provider Performing:Brent Zeola Brys    Procedure: Intubation (31500)  Indication(s) Respiratory Failure  Consent Unable to contact family, proceeded as the procedure was needed emergently   Anesthesia Etomidate, Versed, Fentanyl, and Rocuronium   Time Out Verified patient identification, verified procedure, site/side was marked, verified correct patient position, special equipment/implants available, medications/allergies/relevant history reviewed, required imaging and test results available.   Sterile Technique Usual hand hygeine, masks, and gloves were used   Procedure Description Patient positioned in bed supine.  Sedation given as noted above.  Patient was intubated with endotracheal tube using  MAC 4 .  View was Grade 1 full glottis .  Number of attempts was 1.  Colorimetric CO2 detector was consistent with tracheal placement.   Complications/Tolerance None; patient tolerated the procedure well. Chest X-ray is ordered to verify placement.   EBL Minimal   Specimen(s) None  Roselie Awkward, MD [place] [facility] Pager: [phone] Cell: [phone] After 7:00 pm call Elink  [phone]

## 2021-09-23 NOTE — Care Management Important Message (Signed)
Important Message  Patient Details IM Letter placed in Patients room. Name: Thomas [name] MRN: [id number] Date of Birth: [birth year]   Medicare Important Message Given:  Yes     Kerin Salen 09/23/2021, 10:56 AM

## 2021-09-23 NOTE — Progress Notes (Signed)
[facility] Progress Note Patient Name: Thomas [name] DOB: 06-26-1951 MRN: [id number]   Date of Service  09/23/2021  HPI/Events of Note  KUB reviewed.  eICU Interventions  Okay to begin enteral nutrition.        [name] [name] 09/23/2021, 9:12 PM

## 2021-09-23 NOTE — Progress Notes (Addendum)
[facility] PROGRESS NOTE    Progress Note  Thomas [name]  JAS:[id number] DOB: 07-Apr-1951 DOA: 09/16/2021 PCP: Antonietta Jewel, MD     Brief Narrative:   Thomas [name] is an 70 y.o. male past medical history of diabetes mellitus type 2, essential hypertension, chronic kidney disease stage IV, chronic normocytic anemia, brought into the ED from home via EMS due to concern by his brother BJ not looking well, not eating or taking his medications for several days.  Upon EMS arrival, his blood glucose was 57 was given D10.  Work-up revealed AKI on CKD 4, suspected prerenal secondary to dehydration.  Also revealed left lower lobe community-acquired pneumonia, left lower extremity cellulitis and age-indeterminate left lower extremity DVT.  MRI left foot could not rule out second digit osteomyelitis.  Seen by podiatry Dr. Posey Pronto, will plan to follow-up outpatient.  No evidence of infection or osteomyelitis to the second digits, no local wound care indicated, per podiatry.  He received empiric IV antibiotics x 6 days.  MRI thoracic spine 09/17/2021 did not show evidence of discitis.  Hospital course complicated by intractable nausea and vomiting for which GI was consulted.  Abdominal x-ray was unrevealing.  Plan for EGD on 09/23/21.  Also complicated by severe delirium.  CT head/CAP ordered on 09/23/21 to rule out other causes.  EGD canceled on 09/23/21 due to delirium.  09/23/2021: Seen and examined at bedside.  He is confused and agitated on 2 points restraints.  Hyperventilating ABG 7.533/24.2/74.5 on RA.  Transferred to higher level of care for closer monitoring.    Assessment/Plan:   Acute kidney injury on chronic kidney disease stage 3B: Likely prerenal secondary to dehydration, UA pyuria and proteinuria. Renal function is worsening again with poor oral intake  Continue to avoid nephrotoxic agents Continue to hold off home Lasix. Currently on gentle IV fluid hydration D5NS at 50cc/hr due to  poor oral intake Creatinine on admission 4.62  Delirium/Acute metabolic encephalopathy due to acute illness CT head could not rule out left occipital lobe infarct due to motion.  Will obtain MRI brain. Check Ammonia level  Continue to treat underlying conditions Delirium precautions, non pharmacological:  Open blinds during the day, redirect, OOB to chair during the day with fall precautions.  Pharmacological:  Seroquel 12.5 mg BID, Trazodone QHS If unable to swallow will start NG tube if ok with GI and no acute findings on CT abd/pel  Left lower extremity age-indeterminate DVT, seen on Doppler ultrasound 09/17/2021, POA Initially on full dose subcu Lovenox twice daily Switched to Eliquis on 09/20/2021. Started heparin drip on 09/22/2021 due to possible EGD.   Pharmacy assisting with dosing.  Appreciate assistance.  Non anion gap metabolic acidosis 2/2 to renal failure Serum bicarb 17 with up trending cr 2.54 from 2.43. Continue gentle IV fluid hydration Lactic acid normal  Leukocytosis Recurrent, wbc was normal on 12/4 IV abx were stopped Tmax 99.2 Lactic acid normal Blood cx from 09/16/21 negative Final. Follow CTAP 09/23/21 Monitor fever curve and WBC off abx.  Hypocalcemia, repleted Calcium correction for albumin calculated 8.2 Received 1 g calcium gluconate on 09/23/21  Moderate to severe R hydroureteronephrosis seen on CT scan Increased compared to prior exam, no evidence of obstructing calculus Management per urology  Treated LLL CAP, POA: Completed 6 days of Rocephin and 5 days of azithromycin O2 saturation 98% 3LNC Wean off O2 suppl as tolerated  Intractable nausea and vomiting, prior history of esophagitis, LA Grade A non bleeding esophagitis seen  on EGD 11/25/2019 Continue IV protonix 40 mg BID IV antiemetics PRN  Treated left lower extremity cellulitis, POA. Received 6 days of empiric IV antibiotics MRI of the left foot, osteomyelitis of second digit could not  be completely ruled out, unlikely per podiatry, Dr. Posey Pronto.  Seen by podiatry Dr. Posey Pronto, plan to follow-up outpatient.  No evidence of infection or osteomyelitis to the second digits.  No local wound care indicated.  Okay to be discharged from podiatry standpoint.  Will need to follow-up with podiatry outpatient. MRSA screen positive 09/21/21.  History of upper GI bleed with ground coffee emesis/LA grade A seen on EGD done on 11/25/2019 by Dr. Benson Norway  No PPI seen on his list of home medications Continue IV Protonix 40 mg twice daily. Hemoglobin stable 11.7 from 9.1 from 8.2. No overt bleeding Plan for EGD on 09/23/2021, canceled due to delirium. Appreciate GIs assistance.  Chronic low back pain with prior history of thoracic spine discitis:  MRI of the T-spine no recurrent osteomyelitis or diskitis seen.  Diabetes mellitus type 2 with hyperglycemia: Hemoglobin A1c 6.0 09/20/21 ISS  Essential hypertension, BP not controlled: BP is not at goal.  Has not received his oral antihypertensives, refusing meds, nausea or vomiting. Start NG tube if okay with GI. Currently on Norvasc 5 mg daily, clonidine 0.1 mg twice daily, p.o. Lasix 20 mg daily, Lopressor 50 mg twice daily Continue to closely monitor vital signs.  Anemia of chronic renal disease/iron deficiency anemia: Iron deficiency noted on iron studies. Hemoglobin stable 11.7. Continue iron supplement  Chronic pain: Continue pain management and bowel regimen.  Consult: Podiatry, signed off  Physical debility PT assessed and recommended SNF TOC assisting with SNF placement. Continue PT OT with assistance and fall precautions.  Obesity BMI 33 Recommend weight loss outpatient with regular physical activity and healthy dieting.  Dry eyes/left conjunctivitis, suspect bacterial Cipro eyedrops.  Critical care time: 65 minutes.      ARF (acute renal failure) (HCC) Active Problems:   [name], right ([place])   DM type 2 (diabetes  mellitus, type 2) ([place])   HTN (hypertension)   BPH (benign prostatic hyperplasia)   Chronic pain syndrome   CAP (community acquired pneumonia)  RN Pressure Injury Documentation: Pressure Injury 06/03/21 Hip Anterior;Left Stage 1 -  Intact skin with non-blanchable redness of a localized area usually over a bony prominence. skin intact and has dark red bruising (Active)  06/03/21 0238  Location: Hip  Location Orientation: Anterior;Left  Staging: Stage 1 -  Intact skin with non-blanchable redness of a localized area usually over a bony prominence.  Wound Description (Comments): skin intact and has dark red bruising  Present on Admission: Yes     Pressure Injury 06/03/21 Penis Mid;Circumferential Stage 2 -  Partial thickness loss of dermis presenting as a shallow open injury with a red, pink wound bed without slough. swollen, red, and weeping with slight tear on top (Active)  06/03/21 0240  Location: Penis  Location Orientation: Mid;Circumferential  Staging: Stage 2 -  Partial thickness loss of dermis presenting as a shallow open injury with a red, pink wound bed without slough.  Wound Description (Comments): swollen, red, and weeping with slight tear on top  Present on Admission: Yes    Estimated body mass index is 30.9 kg/m as calculated from the following:   Height as of this encounter: 6\' 6"  (1.981 m).   Weight as of this encounter: 121.3 kg.    DVT prophylaxis: Heparin drip.  Family Communication: Updated his brother BJ via phone on 09/23/21. Status is: Inpatient.  Patient requires at least 2 midnights for further evaluation and treatment of present condition.   Code Status:     Code Status Orders  (From admission, onward)           Start     Ordered   09/17/21 0347  Full code  Continuous        09/17/21 0349           Code Status History     Date Active Date Inactive Code Status Order ID Comments User Context   06/02/2021 2155 06/07/2021 0017 Full Code  [id number]  Geradine Girt, DO ED   08/30/2020 0853 10/03/2020 2226 Full Code [id number]  Mosetta Anis, MD ED   11/24/2019 0932 12/17/2019 1907 Full Code [id number]  Karmen Bongo, MD Inpatient   08/31/2019 0300 10/09/2019 1947 Full Code [id number]  [name], MD Inpatient         IV Access:   Peripheral IV   Procedures and diagnostic studies:   DG Abd Portable 1V  Result Date: 09/21/2021 CLINICAL DATA:  Nausea and vomiting, abdominal pain. EXAM: PORTABLE ABDOMEN - 1 VIEW COMPARISON:  Plain film of the abdomen dated 09/15/2020. FINDINGS: Visualized bowel gas pattern is nonobstructive. No evidence of soft tissue mass or abnormal fluid collection. No evidence of free intraperitoneal air. There is a dense opacity at the LEFT lung base, similar to chest x-ray of 09/16/2021, suspected pneumonia. IMPRESSION: 1. Nonobstructive bowel gas pattern and no evidence of acute intra-abdominal abnormality. 2. Probable LEFT lower lobe pneumonia. Electronically Signed   By: Franki Cabot M.D.   On: 09/21/2021 14:22     Medical Consultants:  Podiatry.    Objective:    Vitals:   09/23/21 0435 09/23/21 0534 09/23/21 0909 09/23/21 1017  BP: (!) 174/127 (!) 177/114 (!) 188/135 (!) 165/114  Pulse: (!) 124 (!) 113 (!) 135 (!) 115  Resp:   (!) 21 (!) 22  Temp: 98.2 F (36.8 C)  98.3 F (36.8 C) 98.7 F (37.1 C)  TempSrc: Oral  Oral Oral  SpO2: 94% 94% 95% 95%  Weight:      Height:       SpO2: 95 % O2 Flow Rate (L/min): 1.5 L/min   Intake/Output Summary (Last 24 hours) at 09/23/2021 1143 Last data filed at 09/23/2021 0910 Gross per 24 hour  Intake 111.46 ml  Output 751 ml  Net -639.54 ml   Filed Weights   09/16/21 2200 09/18/21 0453 09/22/21 0420  Weight: 130.2 kg 132 kg 121.3 kg    Exam: General exam: Frail-appearing he is agitated and confused.   Respiratory system: Clear to auscultation no wheezes or rales.  Hyperventilating with use of accessory muscles to breathe.  Poor  inspiratory effort. Cardiovascular system: Tachycardic with no rubs or gallops. Gastrointestinal system: Obese nontender bowel sounds present.   Extremities: Right below the knee amputation Skin: Resolving cellulitis of the left lower extremity. Psychiatry: Unable to assess mood due to altered mental status. Neuro: Not following commands.  Data Reviewed:    Labs: Basic Metabolic Panel: Recent Labs  Lab 09/16/21 2245 09/18/21 0934 09/19/21 0506 09/20/21 0613 09/21/21 0700 09/22/21 0957 09/23/21 0952  NA 135 140  --  138  --  140 139  K 4.3 4.7  --  4.9  --  4.0 3.9  CL 104 113*  --  109  --  109 110  CO2 20*  21*  --  22  --  25 17*  GLUCOSE 86 106*  --  111*  --  109* 181*  BUN 73* 58*  --  30*  --  24* 28*  CREATININE 4.62* 3.51* 2.86* 2.35*  2.33* 2.22* 2.43* 2.54*  CALCIUM 7.3* 7.1*  --  7.5*  --  7.5* 7.6*  MG  --   --   --   --   --  1.4* 1.7  PHOS  --   --   --  3.8  --  3.3 3.8   GFR Estimated Creatinine Clearance: 40.7 mL/min (A) (by C-G formula based on SCr of 2.54 mg/dL (H)). Liver Function Tests: Recent Labs  Lab 09/16/21 2245 09/20/21 0613 09/22/21 0957 09/23/21 0952  AST 26  --  42* 49*  ALT 11  --  17 20  ALKPHOS 82  --  88 94  BILITOT 0.5  --  0.6 1.1  PROT 8.2*  --  7.1 7.9  ALBUMIN 3.1* 2.8* 3.0* 3.3*   Recent Labs  Lab 09/22/21 0957  LIPASE 32   No results for input(s): AMMONIA in the last 168 hours. Coagulation profile No results for input(s): INR, PROTIME in the last 168 hours. COVID-19 Labs  No results for input(s): DDIMER, FERRITIN, LDH, CRP in the last 72 hours.   Lab Results  Component Value Date   SARSCOV2NAA NEGATIVE 09/16/2021   [place] NEGATIVE 06/06/2021   [place] NEGATIVE 06/02/2021   [place] NEGATIVE 10/05/2020    CBC: Recent Labs  Lab 09/16/21 2245 09/18/21 0934 09/20/21 0613 09/22/21 0957 09/23/21 0952  WBC 7.0 5.2 5.6 7.4 12.6*  NEUTROABS 5.7  --   --  5.8 11.1*  HGB 8.7* 8.2* 9.1* 10.3*  11.7*  HCT 28.2* 26.3* 29.1* 31.6* 36.8*  MCV 101.8* 100.4* 100.0 96.3 95.6  PLT 228 213 238 266 339   Cardiac Enzymes: No results for input(s): CKTOTAL, CKMB, CKMBINDEX, TROPONINI in the last 168 hours. BNP (last 3 results) No results for input(s): PROBNP in the last 8760 hours. CBG: Recent Labs  Lab 09/22/21 1654 09/22/21 2014 09/23/21 0017 09/23/21 0416 09/23/21 0833  GLUCAP 110* 145* 167* 152* 274*   D-Dimer: No results for input(s): DDIMER in the last 72 hours.  Hgb A1c: No results for input(s): HGBA1C in the last 72 hours.  Lipid Profile: No results for input(s): CHOL, HDL, LDLCALC, TRIG, CHOLHDL, LDLDIRECT in the last 72 hours. Thyroid function studies: No results for input(s): TSH, T4TOTAL, T3FREE, THYROIDAB in the last 72 hours.  Invalid input(s): FREET3 Anemia work up: No results for input(s): VITAMINB12, FOLATE, FERRITIN, TIBC, IRON, RETICCTPCT in the last 72 hours.  Sepsis Labs: Recent Labs  Lab 09/16/21 2245 09/18/21 0934 09/20/21 0613 09/22/21 0957 09/23/21 0952  PROCALCITON  --  0.12  --  0.10  --   WBC 7.0 5.2 5.6 7.4 12.6*  LATICACIDVEN 0.8  --   --   --  1.3   Microbiology Recent Results (from the past 240 hour(s))  Blood culture (routine x 2)     Status: None   Collection Time: 09/16/21 10:45 PM   Specimen: BLOOD  Result Value Ref Range Status   Specimen Description   Final    BLOOD BLOOD RIGHT ARM Performed at [facility] [address], [place], [place] [address]    Special Requests   Final    BOTTLES DRAWN AEROBIC AND ANAEROBIC Blood Culture adequate volume Performed at [facility]  [address], [place], [place] [address]    Culture   Final    NO GROWTH 5 DAYS Performed at [facility], [place] [address], [place], [place] [address]    Report Status 09/22/2021 FINAL  Final  Resp Panel by RT-PCR (Flu A&B, Covid)     Status: None   Collection Time: 09/16/21 10:45 PM   Specimen:  Nasopharyngeal(NP) swabs in vial transport medium  Result Value Ref Range Status   SARS Coronavirus 2 by RT PCR NEGATIVE NEGATIVE Final    Comment: (NOTE) SARS-CoV-2 target nucleic acids are NOT DETECTED.  The SARS-CoV-2 RNA is generally detectable in upper respiratory specimens during the acute phase of infection. The lowest concentration of SARS-CoV-2 viral copies this assay can detect is 138 copies/mL. A negative result does not preclude SARS-Cov-2 infection and should not be used as the sole basis for treatment or other patient management decisions. A negative result may occur with  improper specimen collection/handling, submission of specimen other than nasopharyngeal swab, presence of viral mutation(s) within the areas targeted by this assay, and inadequate number of viral copies(<138 copies/mL). A negative result must be combined with clinical observations, patient history, and epidemiological information. The expected result is Negative.  Fact Sheet for Patients:  EntrepreneurPulse.com.au  Fact Sheet for Healthcare Providers:  IncredibleEmployment.be  This test is no t yet approved or cleared by the [place] FDA and  has been authorized for detection and/or diagnosis of SARS-CoV-2 by FDA under an Emergency Use Authorization (EUA). This EUA will remain  in effect (meaning this test can be used) for the duration of the COVID-19 declaration under Section 564(b)(1) of the Act, 21 U.S.C.section 360bbb-3(b)(1), unless the authorization is terminated  or revoked sooner.       Influenza A by PCR NEGATIVE NEGATIVE Final   Influenza B by PCR NEGATIVE NEGATIVE Final    Comment: (NOTE) The Xpert Xpress SARS-CoV-2/FLU/RSV plus assay is intended as an aid in the diagnosis of influenza from Nasopharyngeal swab specimens and should not be used as a sole basis for treatment. Nasal washings and aspirates are unacceptable for Xpert Xpress  SARS-CoV-2/FLU/RSV testing.  Fact Sheet for Patients: EntrepreneurPulse.com.au  Fact Sheet for Healthcare Providers: IncredibleEmployment.be  This test is not yet approved or cleared by the [place] FDA and has been authorized for detection and/or diagnosis of SARS-CoV-2 by FDA under an Emergency Use Authorization (EUA). This EUA will remain in effect (meaning this test can be used) for the duration of the COVID-19 declaration under Section 564(b)(1) of the Act, 21 U.S.C. section 360bbb-3(b)(1), unless the authorization is terminated or revoked.  Performed at [facility], [place] [address] [place], [place] [address]   Urine Culture     Status: None   Collection Time: 09/18/21 12:25 PM   Specimen: Urine, Clean Catch  Result Value Ref Range Status   Specimen Description   Final    URINE, CLEAN CATCH Performed at [facility], [place] [address], [place], [place] [address]    Special Requests   Final    NONE Performed at [facility], [place] [address] [place], [place] [address]    Culture   Final    NO GROWTH Performed at [facility], [place] [address], [place], [place] [address]    Report Status 09/19/2021 FINAL  Final  MRSA Next Gen by PCR, Nasal     Status: Abnormal   Collection Time: 09/21/21  6:16 AM   Specimen:  Nasal Mucosa; Nasal Swab  Result Value Ref Range Status   MRSA by PCR Next Gen DETECTED (A) NOT DETECTED Final    Comment: RESULT CALLED TO, READ BACK BY AND VERIFIED WITH: HUGHUY, S. RN ON 09/21/2021 @ 0941 BY MECIAL J. (NOTE) The GeneXpert MRSA Assay (FDA approved for NASAL specimens only), is one component of a comprehensive MRSA colonization surveillance program. It is not intended to diagnose MRSA infection nor to guide or monitor treatment for MRSA infections. Test performance is not FDA approved in patients less than 74 years old. Performed at [facility], [place] [address] [place],  [address]      Medications:    allopurinol  100 mg Oral Daily   amLODipine  5 mg Oral Daily   atorvastatin  80 mg Oral Daily   bisacodyl  10 mg Rectal Once   Chlorhexidine Gluconate Cloth  6 each Topical Q0600   ciprofloxacin  2 drop Both Eyes Q4H while awake   cloNIDine  0.3 mg Transdermal Weekly   ferrous sulfate  325 mg Oral Q breakfast   finasteride  5 mg Oral Daily   gabapentin  300 mg Oral TID   insulin aspart  0-9 Units Subcutaneous TID WC   levothyroxine  25 mcg Intravenous Daily   mouth rinse  15 mL Mouth Rinse BID   metoprolol tartrate  10 mg Intravenous Q8H   mupirocin ointment  1 application Nasal BID   naloxegol oxalate  12.5 mg Oral Daily   naphazoline-pheniramine  2 drop Both Eyes TID   oxyCODONE  30 mg Oral Q12H   pantoprazole (PROTONIX) IV  40 mg Intravenous Q12H   saccharomyces boulardii  250 mg Oral BID   senna-docusate  2 tablet Oral BID   simethicone  80 mg Oral QID   tamsulosin  0.4 mg Oral Daily   vitamin B-12  200 mcg Oral Daily   Continuous Infusions:  dextrose 5 % and 0.9% NaCl 50 mL/hr at 09/22/21 2150   methocarbamol (ROBAXIN) IV 500 mg (09/22/21 2312)   promethazine (PHENERGAN) injection (IM or IVPB) Stopped (09/20/21 1438)      LOS: 6 days   Kayleen Memos  [facility]  09/23/2021, 11:43 AM

## 2021-09-23 NOTE — Progress Notes (Signed)
RN at bedside with patient in transport to MRI. Got patient moved to MRI table and pt began pulling at things, shaking his head, pulling out the MRI ear plugs and headphones. MRI unsuccessful. Pt transported back to his room. MD [place] Crane notified.

## 2021-09-24 ENCOUNTER — Inpatient Hospital Stay (HOSPITAL_COMMUNITY): Payer: Medicare Other

## 2021-09-24 DIAGNOSIS — J189 Pneumonia, unspecified organism: Secondary | ICD-10-CM | POA: Diagnosis not present

## 2021-09-24 DIAGNOSIS — R0609 Other forms of dyspnea: Secondary | ICD-10-CM | POA: Diagnosis not present

## 2021-09-24 DIAGNOSIS — N400 Enlarged prostate without lower urinary tract symptoms: Secondary | ICD-10-CM | POA: Diagnosis not present

## 2021-09-24 DIAGNOSIS — N179 Acute kidney failure, unspecified: Secondary | ICD-10-CM | POA: Diagnosis not present

## 2021-09-24 DIAGNOSIS — Z89511 Acquired absence of right leg below knee: Secondary | ICD-10-CM

## 2021-09-24 DIAGNOSIS — E1169 Type 2 diabetes mellitus with other specified complication: Secondary | ICD-10-CM

## 2021-09-24 DIAGNOSIS — Z794 Long term (current) use of insulin: Secondary | ICD-10-CM

## 2021-09-24 LAB — GLUCOSE, CAPILLARY
Glucose-Capillary: 132 mg/dL — ABNORMAL HIGH (ref 70–99)
Glucose-Capillary: 135 mg/dL — ABNORMAL HIGH (ref 70–99)
Glucose-Capillary: 105 mg/dL — ABNORMAL HIGH (ref 70–99)
Glucose-Capillary: 125 mg/dL — ABNORMAL HIGH (ref 70–99)
Glucose-Capillary: 117 mg/dL — ABNORMAL HIGH (ref 70–99)
Glucose-Capillary: 119 mg/dL — ABNORMAL HIGH (ref 70–99)

## 2021-09-24 LAB — CBC
HCT: 32.7 % — ABNORMAL LOW (ref 39.0–52.0)
Hemoglobin: 10.1 g/dL — ABNORMAL LOW (ref 13.0–17.0)
MCH: 30.6 pg (ref 26.0–34.0)
MCHC: 30.9 g/dL (ref 30.0–36.0)
MCV: 99.1 fL (ref 80.0–100.0)
Platelets: 260 10*3/uL (ref 150–400)
RBC: 3.3 MIL/uL — ABNORMAL LOW (ref 4.22–5.81)
RDW: 15.2 % (ref 11.5–15.5)
WBC: 7.9 10*3/uL (ref 4.0–10.5)
nRBC: 0 % (ref 0.0–0.2)

## 2021-09-24 LAB — ECHOCARDIOGRAM COMPLETE
Calc EF: 36.3 %
Height: 78 in
S' Lateral: 3.9 cm
Single Plane A2C EF: 33 %
Single Plane A4C EF: 38.8 %
Weight: 4183.45 oz

## 2021-09-24 LAB — HEPARIN LEVEL (UNFRACTIONATED): Heparin Unfractionated: 0.59 IU/mL (ref 0.30–0.70)

## 2021-09-24 LAB — PHOSPHORUS: Phosphorus: 5.3 mg/dL — ABNORMAL HIGH (ref 2.5–4.6)

## 2021-09-24 LAB — MAGNESIUM: Magnesium: 2 mg/dL (ref 1.7–2.4)

## 2021-09-24 MED ORDER — SIMETHICONE 80 MG PO CHEW
80.0000 mg | CHEWABLE_TABLET | Freq: Four times a day (QID) | ORAL | Status: DC
  Administered 2021-09-24 – 2021-10-01 (×26): 80 mg via ORAL
  Filled 2021-09-24 (×27): qty 1

## 2021-09-24 MED ORDER — HYDRALAZINE HCL 50 MG PO TABS
100.0000 mg | ORAL_TABLET | Freq: Three times a day (TID) | ORAL | Status: DC
  Administered 2021-09-24 – 2021-10-01 (×21): 100 mg via ORAL
  Filled 2021-09-24 (×22): qty 2

## 2021-09-24 MED ORDER — HYDRALAZINE HCL 20 MG/ML IJ SOLN
10.0000 mg | Freq: Four times a day (QID) | INTRAMUSCULAR | Status: DC | PRN
  Filled 2021-09-24: qty 1

## 2021-09-24 MED ORDER — GABAPENTIN 300 MG PO CAPS
300.0000 mg | ORAL_CAPSULE | Freq: Three times a day (TID) | ORAL | Status: DC
  Administered 2021-09-24 – 2021-10-01 (×21): 300 mg via ORAL
  Filled 2021-09-24 (×21): qty 1

## 2021-09-24 MED ORDER — CHLORHEXIDINE GLUCONATE 0.12 % MT SOLN
15.0000 mL | Freq: Two times a day (BID) | OROMUCOSAL | Status: DC
  Administered 2021-09-24 – 2021-10-01 (×15): 15 mL via OROMUCOSAL
  Filled 2021-09-24 (×13): qty 15

## 2021-09-24 MED ORDER — ATORVASTATIN CALCIUM 40 MG PO TABS
80.0000 mg | ORAL_TABLET | Freq: Every day | ORAL | Status: DC
  Administered 2021-09-25 – 2021-10-01 (×7): 80 mg via ORAL
  Filled 2021-09-24 (×7): qty 2

## 2021-09-24 MED ORDER — ACETAMINOPHEN 650 MG RE SUPP: 650.0000 mg | Freq: Four times a day (QID) | RECTAL | Status: DC | PRN

## 2021-09-24 MED ORDER — VITAMIN B-12 100 MCG PO TABS
200.0000 ug | ORAL_TABLET | Freq: Every day | ORAL | Status: DC
  Administered 2021-09-25 – 2021-10-01 (×7): 200 ug via ORAL
  Filled 2021-09-24 (×7): qty 2

## 2021-09-24 MED ORDER — ACETAMINOPHEN 325 MG PO TABS: 650.0000 mg | ORAL_TABLET | Freq: Four times a day (QID) | ORAL | Status: DC | PRN

## 2021-09-24 MED ORDER — ORAL CARE MOUTH RINSE
15.0000 mL | Freq: Two times a day (BID) | OROMUCOSAL | Status: DC
  Administered 2021-09-24 – 2021-09-30 (×8): 15 mL via OROMUCOSAL

## 2021-09-24 MED ORDER — ALLOPURINOL 100 MG PO TABS
100.0000 mg | ORAL_TABLET | Freq: Every day | ORAL | Status: DC
  Administered 2021-09-25 – 2021-10-01 (×7): 100 mg via ORAL
  Filled 2021-09-24 (×7): qty 1

## 2021-09-24 MED ORDER — LABETALOL HCL 5 MG/ML IV SOLN
20.0000 mg | INTRAVENOUS | Status: DC | PRN
  Administered 2021-09-24 – 2021-09-25 (×4): 20 mg via INTRAVENOUS
  Filled 2021-09-24 (×4): qty 4

## 2021-09-24 MED ORDER — FERROUS SULFATE 325 (65 FE) MG PO TABS
325.0000 mg | ORAL_TABLET | Freq: Every day | ORAL | Status: DC
  Administered 2021-09-25 – 2021-10-01 (×7): 325 mg via ORAL
  Filled 2021-09-24 (×7): qty 1

## 2021-09-24 MED ORDER — METHOCARBAMOL 500 MG PO TABS
750.0000 mg | ORAL_TABLET | Freq: Three times a day (TID) | ORAL | Status: DC
  Administered 2021-09-24 – 2021-09-26 (×5): 750 mg via ORAL
  Filled 2021-09-24 (×6): qty 2

## 2021-09-24 MED ORDER — AMLODIPINE BESYLATE 5 MG PO TABS
5.0000 mg | ORAL_TABLET | Freq: Every day | ORAL | Status: DC
  Administered 2021-09-25 – 2021-10-01 (×7): 5 mg via ORAL
  Filled 2021-09-24 (×8): qty 1

## 2021-09-24 MED ORDER — METOPROLOL TARTRATE 50 MG PO TABS
50.0000 mg | ORAL_TABLET | Freq: Two times a day (BID) | ORAL | Status: DC
  Administered 2021-09-24 – 2021-10-01 (×14): 50 mg via ORAL
  Filled 2021-09-24: qty 2
  Filled 2021-09-24 (×5): qty 1
  Filled 2021-09-24 (×2): qty 2
  Filled 2021-09-24: qty 1
  Filled 2021-09-24: qty 2
  Filled 2021-09-24: qty 1
  Filled 2021-09-24: qty 2
  Filled 2021-09-24 (×4): qty 1

## 2021-09-24 MED ORDER — SACCHAROMYCES BOULARDII 250 MG PO CAPS
250.0000 mg | ORAL_CAPSULE | Freq: Two times a day (BID) | ORAL | Status: DC
  Administered 2021-09-24 – 2021-10-01 (×14): 250 mg via ORAL
  Filled 2021-09-24 (×14): qty 1

## 2021-09-24 MED ORDER — LACTULOSE 10 GM/15ML PO SOLN
10.0000 g | Freq: Two times a day (BID) | ORAL | Status: DC
  Administered 2021-09-24 – 2021-10-01 (×14): 10 g via ORAL
  Filled 2021-09-24 (×14): qty 15

## 2021-09-24 MED ORDER — SENNOSIDES-DOCUSATE SODIUM 8.6-50 MG PO TABS
1.0000 | ORAL_TABLET | Freq: Two times a day (BID) | ORAL | Status: DC
Start: 2021-09-24 — End: 2021-10-01
  Administered 2021-09-24 – 2021-10-01 (×14): 1 via ORAL
  Filled 2021-09-24 (×15): qty 1

## 2021-09-24 NOTE — Progress Notes (Incomplete)
[place] for lovenox --> Eliquis > Heparin gtt Indication: DVT  Patient Measurements: Height: 6\' 6"  (198.1 cm) Weight: 118.6 kg (261 lb 7.5 oz) IBW/kg (Calculated) : 91.4 Heparin Dosing Weight: 116 kg  Vital Signs: Temp: 97.8 F (36.6 C) (12/06 0833) Temp Source: Axillary (12/06 0833) BP: 122/55 (12/06 0737) Pulse Rate: 91 (12/06 0737)  Labs: Recent Labs    09/22/21 0957 09/22/21 2007 09/23/21 0952 09/23/21 1920 09/24/21 0303  HGB 10.3*  --  11.7*  --  10.1*  HCT 31.6*  --  36.8*  --  32.7*  PLT 266  --  339  --  260  APTT  --  58*  --  39*  --   HEPARINUNFRC  --  >1.10*  --  0.47 0.59  CREATININE 2.43*  --  2.54*  --   --     Estimated Creatinine Clearance: 40.3 mL/min (A) (by C-G formula based on SCr of 2.54 mg/dL (H)).  Assessment: Patient's a 74 M presented to the ED on 11/28 with c/o LE swelling and pain. LE doppler on 11/29 showed age indeterminate DVT of L femoral vein. Pt was initially anticoagulated with LMWH; transitioned to apixaban; now on heparin drip per pharmacy dosing due to inability to take PO mediations and need for EGD.   EGD procedure scheduled for 12/5 cancelled d/t pt's clinical status.  Patient was subsequently transferred to the ICU and is now intubated.  - 12/5 head CT: Motion limited study with new hypoattenuation in the left occipital lobe. While this could represent artifact, acute infarct is not excluded. - plan for head MRI for further workup  Today, 09/24/2021: - Heparin level is therapeutic at 0.59 with heparin gtt @ 2000 units/hr  - CBC: Hgb stable (10.1) , Plt WNL   Plan:  Continue heparin at 2000 units/hr Follow daily HL and CBC Monitor for signs of bleeding

## 2021-09-24 NOTE — Progress Notes (Signed)
NAME:  Thomas [name], MRN:  [id number], DOB:  Oct 24, 1950, LOS: 7 ADMISSION DATE:  09/16/2021, CONSULTATION DATE:  12/5 REFERRING MD:  N[place]ada Crane, CHIEF COMPLAINT:  Confusion   History of Present Illness:  70 y/o male with multiple medical problems was admitted on 11/28 after he was found to be confused after not eating several days.  He was brought to the [facility] ER was found to have hypoglycemia, evidence of AKI, RLL pneumonia, cellulitis, and a DVT.  He was admitted and treated for the same.  After admission he has developed worsening confusion, agitation, and hypertension.  Plans were made today for an MRI of his brain but he was unable to lie flat long enough to tolerate that.  His blood pressure has been treated with a continuous infusion of diltiazem.  Pulmonary and critical care medicine was consulted late in the day shift of 12/5 after he had been confused, hypertensive and combative for hours with intermittent unresponsiveness.  The patient was unresponsive and unable to provide history.  I tried calling his brother and he did not answer.  History was obtained by chart review.  Pertinent  Medical History  BPH Chronic pain Chronic kidney disease Diabetes mellitus type 2 Gout Hypertension Physical deconditioning Arthritis  Significant Hospital Events: Including procedures, antibiotic start and stop dates in addition to other pertinent events   11/28 admission for pneumonia, cellulitis, DVT 11/29 lower extremity Doppler ultrasound age-indeterminate left femoral vein DVT 11/29 MRI left foot: Findings most likely consistent with severe arthritis though early osteomyelitis is possible.  Diffuse swelling on exam no evidence of abscess 11/29 thoracic spine MRI postoperative changes with posterior fusion hardware, no complicating features, chronic changes T10-11 findings not suspicious for discitis or osteomyelitis, T8-9 mild flattening of the thecal sac with disc protrusion, 12/5 moved to ICU for  hypertension, encephalopathy, agitation, intermittent periods of unresponsiveness, intubated 12/5 CT abdomen pelvis small bilateral pleural effusions noted, stable left lower lobe opacity concerning for pneumonia, moderate to severe right hydroureteronephrosis is noted without evidence of obstructing calculus.  Increased compared to prior exam.,  Concerning for ureteral stricture 12/5 CT head Motion limited study with new hypoattenuation left occipital lobe, could represent infarct, acute infarct is not included, MRI would be helpful,  12/6 extubated   Interim History / Subjective:  Feels better this morning Awake, alert Wants to be extubated  Objective   Blood pressure (!) 152/96, pulse 96, temperature 97.8 F (36.6 C), temperature source Axillary, resp. rate 20, height 6\' 6"  (1.981 m), weight 118.6 kg, SpO2 97 %.    Vent Mode: PSV;CPAP FiO2 (%):  [30 %-60 %] 30 % Set Rate:  [22 bmp] 22 bmp Vt Set:  [550 mL] 550 mL PEEP:  [5 cmH20] 5 cmH20 Pressure Support:  [5 cmH20] 5 cmH20 Plateau Pressure:  [13 cmH20-16 cmH20] 14 cmH20   Intake/Output Summary (Last 24 hours) at 09/24/2021 0956 Last data filed at 09/24/2021 1601 Gross per 24 hour  Intake 2600.27 ml  Output 500 ml  Net 2100.27 ml   Filed Weights   09/18/21 0453 09/22/21 0420 09/24/21 0500  Weight: 132 kg 121.3 kg 118.6 kg    Examination:  General:  In bed on vent HENT: NCAT ETT in place PULM: CTA B, vent supported breathing CV: RRR, no mgr GI: BS+, soft, nontender MSK: normal bulk and tone Neuro: awake, following commands   Resolved Hospital Problem list     Assessment & Plan:  Acute metabolic encephalopathy: Concern for hypertensive emergency/PRES, ativan  related (12/5) > resolved Inability to protect airway  > resolved Extubate this morning Consider MRI brain, but at this point seems unnecessary Control blood pressure to SBP < 160   Acute respiratory failure with hypoxemia due to inability to protect airway  > resolved As above Aspiration precautions   Sinus tach: appears volume deplete on exam > resolved Encourage po intake  Community-acquired pneumonia: Has been treated with antibiotics Monitor off antibiotics  Acute kidney injury:-Improved: KVO fluids Monitor BMET and UOP Replace electrolytes as needed  Hypertensive emergency: Add metoprolol 1/2 home dose Continue clonidine patch Add hydralazine per home dose Add amlodipine per home dose Wean off cardene for SBP < 160  DVT left femoral vein Heparin infusion> change to oral option if renal function allows  Right hydroureter nephrosis seen on CT scan Will clarify with TRH if urology was consulted Place foley   Cellulitis left foot > resolved Monitor off antibiotics   Nausea with normocytic anemia, history of esophagitis: GI medicine was planning for endoscopy. PPI bid to continue per GI recommendations EGD per GI   Morbid obesity Physical deconditioning, severe PT  Brother says he has a history of congestive heart failure Will order an echocardiogram, though one was ordered in 07/2021, there is no result available Check echocardiogram  Best Practice (right click and "Reselect all SmartList Selections" daily)   Diet/type: Regular consistency (see orders) DVT prophylaxis: systemic heparin GI prophylaxis: PPI Lines: N/A Foley:  N/A Code Status:  full code Last date of multidisciplinary goals of care discussion [12/6: I discussed goals of care with his brother, he says they have never had this conversation before.  Considering his multiple advanced medical problems his risk of having another severe complication and need for life support remains high.  Will ask palliative medicine to see him to help sort out his advanced care wishes for the future.]  Labs   CBC: Recent Labs  Lab 09/18/21 0934 09/20/21 0613 09/22/21 0957 09/23/21 0952 09/24/21 0303  WBC 5.2 5.6 7.4 12.6* 7.9  NEUTROABS  --   --  5.8 11.1*  --    HGB 8.2* 9.1* 10.3* 11.7* 10.1*  HCT 26.3* 29.1* 31.6* 36.8* 32.7*  MCV 100.4* 100.0 96.3 95.6 99.1  PLT 213 238 266 339 093    Basic Metabolic Panel: Recent Labs  Lab 09/18/21 0934 09/19/21 0506 09/20/21 0613 09/21/21 0700 09/22/21 0957 09/23/21 0952 09/23/21 1920 09/24/21 0303  NA 140  --  138  --  140 139  --   --   K 4.7  --  4.9  --  4.0 3.9  --   --   CL 113*  --  109  --  109 110  --   --   CO2 21*  --  22  --  25 17*  --   --   GLUCOSE 106*  --  111*  --  109* 181*  --   --   BUN 58*  --  30*  --  24* 28*  --   --   CREATININE 3.51* 2.86* 2.35*  2.33* 2.22* 2.43* 2.54*  --   --   CALCIUM 7.1*  --  7.5*  --  7.5* 7.6*  --   --   MG  --   --   --   --  1.4* 1.7 2.0 2.0  PHOS  --   --  3.8  --  3.3 3.8 4.4 5.3*   GFR: Estimated Creatinine Clearance: 40.3 mL/min (  A) (by C-G formula based on SCr of 2.54 mg/dL (H)). Recent Labs  Lab 09/18/21 0934 09/20/21 0613 09/22/21 0957 09/23/21 0952 09/23/21 1542 09/24/21 0303  PROCALCITON 0.12  --  0.10  --  0.18  --   WBC 5.2 5.6 7.4 12.6*  --  7.9  LATICACIDVEN  --   --   --  1.3  --   --     Liver Function Tests: Recent Labs  Lab 09/20/21 0613 09/22/21 0957 09/23/21 0952  AST  --  42* 49*  ALT  --  17 20  ALKPHOS  --  88 94  BILITOT  --  0.6 1.1  PROT  --  7.1 7.9  ALBUMIN 2.8* 3.0* 3.3*   Recent Labs  Lab 09/22/21 0957  LIPASE 32   Recent Labs  Lab 09/23/21 1542  AMMONIA 22    ABG    Component Value Date/Time   PHART 7.384 09/23/2021 2000   PCO2ART 34.9 09/23/2021 2000   PO2ART 110 (H) 09/23/2021 2000   HCO3 20.4 09/23/2021 2000   ACIDBASEDEF 3.5 (H) 09/23/2021 2000   O2SAT 98.0 09/23/2021 2000     Coagulation Profile: No results for input(s): INR, PROTIME in the last 168 hours.  Cardiac Enzymes: No results for input(s): CKTOTAL, CKMB, CKMBINDEX, TROPONINI in the last 168 hours.  HbA1C: Hgb A1c MFr Bld  Date/Time Value Ref Range Status  09/20/2021 06:13 AM 6.0 (H) 4.8 - 5.6 % Final     Comment:    (NOTE) Pre diabetes:          5.7%-6.4%  Diabetes:              >6.4%  Glycemic control for   <7.0% adults with diabetes   06/03/2021 03:54 AM 5.9 (H) 4.8 - 5.6 % Final    Comment:    (NOTE) Pre diabetes:          5.7%-6.4%  Diabetes:              >6.4%  Glycemic control for   <7.0% adults with diabetes     CBG: Recent Labs  Lab 09/23/21 1711 09/23/21 1946 09/23/21 2312 09/24/21 0338 09/24/21 0800  GLUCAP 167* 162* 143* 132* 135*    Critical care time: 35 minutes     Roselie Awkward, MD [place] [facility] Pager: [phone] Cell: [phone] After 7:00 pm call Elink  [phone]

## 2021-09-24 NOTE — Progress Notes (Signed)
OT Cancellation Note  Patient Details Name: MELDON HANZLIK MRN: [id number] DOB: October 30, 1950   Cancelled Treatment:    Reason Eval/Treat Not Completed: Medical issues which prohibited therapy Patient is currently intubated at this time. OT to hold off and check back when patient is medically stable to participate.   Jackelyn Poling OTR/L, [place] [facility] Department Office# [phone] Pager# [phone]   09/24/2021, 7:09 AM

## 2021-09-24 NOTE — Evaluation (Signed)
Clinical/Bedside Swallow Evaluation Patient Details  Name: Thomas [name] MRN: [id number] Date of Birth: [birth year]  Today's Date: 09/24/2021 Time: SLP Start Time (ACUTE ONLY): 6073 SLP Stop Time (ACUTE ONLY): 1717 SLP Time Calculation (min) (ACUTE ONLY): 42 min  Past Medical History:  Past Medical History:  Diagnosis Date   Arthritis    BPH (benign prostatic hyperplasia) 11/24/2019   Chronic pain syndrome 11/24/2019   CKD (chronic kidney disease), stage IV ([place]) 11/24/2019   Diabetes mellitus without complication (HCC)    Gout    Hypertension    Muscle pain    Physical deconditioning 11/24/2019   Scars    Swelling    Past Surgical History:  Past Surgical History:  Procedure Laterality Date   BELOW KNEE LEG AMPUTATION Right    ESOPHAGOGASTRODUODENOSCOPY (EGD) WITH PROPOFOL N/A 11/25/2019   Procedure: ESOPHAGOGASTRODUODENOSCOPY (EGD) WITH PROPOFOL;  Surgeon: Carol Ada, MD;  Location: [facility];  Service: Endoscopy;  Laterality: N/A;   IR FLUORO GUIDE CV LINE RIGHT  09/05/2019   IR LUMBAR DISC ASPIRATION W/IMG GUIDE  11/26/2019   IR US GUIDE VASC ACCESS RIGHT  09/05/2019   RADIOLOGY WITH ANESTHESIA N/A 11/25/2019   Procedure: Thoracic and Lumbar MRI WITH ANESTHESIA;  Surgeon: Radiologist, Medication, MD;  Location: [facility];  Service: Radiology;  Laterality: N/A;   HPI:  70 yo male adm to [facility] with AMS, weakness, found to have ARF on chronic kidney disease with RLE edema with cellulitis and DVT.  CXR showed infiltrates.  Pt with AMS, hypertensive emergency and difficulty protecting airway requiring intubation on 12/6- extubated 12/6.   Swallow evaluation ordered as pt did not pass 3 ounce [place] water screen.  He has also h/o multilevel DDD of cervical spine.    Assessment / Plan / Recommendation  Clinical Impression  Pt alert, able to feed self with assistance..  No focal cranial nerve deficits apparent and pt on room air and his voice is strong with good ability to cough up secretions.    Pt did not drink full 3 ounces of water [place] due to decreased comprehension - however swallowed nearly 2.5 ounces sequentially without difficulties.  Subtle cough x2 during entire intake of 5 ounces water, 3 ounces applesauce and 2 bites of graham cracker.  No increased work of breathing or negative vital sign changes.       Recommend pt have OT/PT consult (if not ordered) as he had some difficulty self feeding - appeared with questionable tremor-like movement of right hand.      Recommend pt have a dys3/thin diet *he has dentures at home and uses them for po.  Educated him to strict aspiration precautions.       Will follow up briefly to assure tolerance and for any indications of instrumental swallow evaluation.  Informed RN and pt and posted swallow precaution signs. SLP Visit Diagnosis: Dysphagia, unspecified (R13.10)    Aspiration Risk  Mild aspiration risk    Diet Recommendation Dysphagia 3 (Mech soft);Thin liquid   Liquid Administration via: Cup;Straw Medication Administration: Whole meds with liquid Supervision: Patient able to self feed Compensations: Slow rate;Small sips/bites Postural Changes: Seated upright at 90 degrees;Remain upright for at least 30 minutes after po intake    Other  Recommendations Oral Care Recommendations: Oral care BID Other Recommendations: Have oral suction available    Recommendations for follow up therapy are one component of a multi-disciplinary discharge planning process, led by the attending physician.  Recommendations may be updated based on patient status,  additional functional criteria and insurance authorization.  Follow up Recommendations No SLP follow up      Assistance Recommended at Discharge PRN  Functional Status Assessment Patient has had a recent decline in their functional status and demonstrates the ability to make significant improvements in function in a reasonable and predictable amount of time.  Frequency and Duration min 1 x/week   1 week       Prognosis Prognosis for Safe Diet Advancement: Good      Swallow Study   General Date of Onset: 09/24/21 HPI: 70 yo male adm to [facility] with AMS, weakness, found to have ARF on chronic kidney disease with RLE edema with cellulitis and DVT.  CXR showed infiltrates.  Pt with AMS, hypertensive emergency and difficulty protecting airway requiring intubation on 12/6- extubated 12/6.   Swallow evaluation ordered as pt did not pass 3 ounce Yale water screen.  He has also h/o multilevel DDD of cervical spine. Type of Study: Bedside Swallow Evaluation Previous Swallow Assessment: none in EPIC Diet Prior to this Study: NPO Temperature Spikes Noted: No Respiratory Status: Room air History of Recent Intubation: Yes Length of Intubations (days): 1 days (less than 24 hours) Date extubated: 09/24/21 Behavior/Cognition: Alert;Cooperative;Pleasant mood;Requires cueing Oral Cavity Assessment: Within Functional Limits Oral Care Completed by SLP: No Oral Cavity - Dentition: Edentulous (dentures at home) Vision: Functional for self-feeding Self-Feeding Abilities: Able to feed self Patient Positioning: Upright in bed Baseline Vocal Quality: Normal Volitional Cough: Strong Volitional Swallow: Able to elicit    Oral/Motor/Sensory Function Overall Oral Motor/Sensory Function: Generalized oral weakness   Ice Chips Ice chips: Within functional limits   Thin Liquid Thin Liquid: Within functional limits Presentation: Self Fed;Straw;Cup Other Comments: subtle cough x2 of approx 10 swallows    Nectar Thick Nectar Thick Liquid: Not tested   Honey Thick Honey Thick Liquid: Not tested   Puree Puree: Within functional limits Presentation: Self Fed;Spoon   Solid     Solid: Within functional limits Presentation: Self Fed Other Comments: no oral retention but pt is edentulous and thus advised only soft foods      [name] 09/24/2021,7:06 PM   Kathleen Lime, MS [facility] SLP [place] Office [phone] Pager [phone]

## 2021-09-24 NOTE — Progress Notes (Signed)
[place] for lovenox --> Eliquis > Heparin gtt Indication: DVT  Patient Measurements: Height: 6\' 6"  (198.1 cm) Weight: 121.3 kg (267 lb 6.7 oz) IBW/kg (Calculated) : 91.4 Heparin Dosing Weight: 116 kg  Vital Signs: Temp: 98.4 F (36.9 C) (12/06 0300) Temp Source: Oral (12/06 0300) BP: 113/71 (12/06 0321) Pulse Rate: 79 (12/06 0300)  Labs: Recent Labs    09/21/21 0700 09/22/21 0957 09/22/21 0957 09/22/21 2007 09/23/21 0952 09/23/21 1920 09/24/21 0303  HGB  --  10.3*   < >  --  11.7*  --  10.1*  HCT  --  31.6*  --   --  36.8*  --  32.7*  PLT  --  266  --   --  339  --  260  APTT  --   --   --  58*  --  39*  --   HEPARINUNFRC  --   --   --  >1.10*  --  0.47 0.59  CREATININE 2.22* 2.43*  --   --  2.54*  --   --    < > = values in this interval not displayed.    Estimated Creatinine Clearance: 40.7 mL/min (A) (by C-G formula based on SCr of 2.54 mg/dL (H)).  Assessment: Patient's a 52 M presented to the ED on 11/28 with c/o LE swelling and pain. LE doppler on 11/29 showed age indeterminate DVT of L femoral vein. Pt was initially anticoagulated with LMWH; transitioned to apixaban; now on heparin drip per pharmacy dosing due to inability to take PO mediations and need for EGD.   Today, 09/24/21 Heparin level therapeutic at 0.59 with heparin gtt @ 2000 units/hr  CBC: Hgb stable (10.1) , Plt WNL   Plan:  Continue heparin at 2000 units/hr Follow daily HL and CBC Monitor for signs of bleeding  If EGD rescheduled, will need guidance from GI on when to hold heparin pre-procedure.   Leone Haven, PharmD 09/24/2021 3:57 AM

## 2021-09-24 NOTE — Procedures (Signed)
Extubation Procedure Note  Patient Details:   Name: Thomas [name] DOB: November 28, 1950 MRN: [id number]   Airway Documentation:    Vent end date: 09/24/21 Vent end time: 0841   Evaluation  O2 sats: stable throughout Complications: No apparent complications Patient did tolerate procedure well. Bilateral Breath Sounds: Diminished   Yes  Pt was extubated to 3L  per CCMD order. Pt tolerated procedure well. Pt was suctioned and had a positive cuff leak prior to extubation. No stridor is heard at this time and pt was able to speak afterwards. Vitals are stable at this time.   [name] 09/24/2021, 8:41 AM

## 2021-09-24 NOTE — Consult Note (Addendum)
H&P Physician requesting consult: Dr. Irene Pap  Chief Complaint: Right hydroureteronephrosis, chronic renal insufficiency,  History of Present Illness: 70 year old male was admitted on 11/28 for confusion and failure to thrive.  He was found to have hypoglycemia, acute renal insufficiency, pneumonia, cellulitis, and a DVT.  He has developed worsening confusion, agitation, hypertension he was admitted to the ICU after being confused, hypertensive, combative.  He has since been extubated.  Creatinine yesterday had stabilized and was 2.5.  It was 2.8 on August 16.  It was 4.62 on admission.  He underwent a CT of the abdomen and pelvis without contrast on the 29th and again yesterday.  This revealed moderate to severe right hydroureteronephrosis down to the level of the bladder without an obstructing stone.  He also had bladder distention.  He had moderate right cortical atrophy.  Hydronephrosis and atrophy have been somewhat chronic.  For example, a CT scan of the thoracic spine on 08/29/2020 also demonstrated a massively distended bladder and right hydronephrosis.  Patient states he takes Flomax and finasteride.  He has baseline lower urinary tract symptoms including straining to void, weak stream.  He now has a Foley catheter.  Past Medical History:  Diagnosis Date   Arthritis    BPH (benign prostatic hyperplasia) 11/24/2019   Chronic pain syndrome 11/24/2019   CKD (chronic kidney disease), stage IV ([place]) 11/24/2019   Diabetes mellitus without complication (HCC)    Gout    Hypertension    Muscle pain    Physical deconditioning 11/24/2019   Scars    Swelling    Past Surgical History:  Procedure Laterality Date   BELOW KNEE LEG AMPUTATION Right    ESOPHAGOGASTRODUODENOSCOPY (EGD) WITH PROPOFOL N/A 11/25/2019   Procedure: ESOPHAGOGASTRODUODENOSCOPY (EGD) WITH PROPOFOL;  Surgeon: Carol Ada, MD;  Location: [facility];  Service: Endoscopy;  Laterality: N/A;   IR FLUORO GUIDE CV LINE RIGHT   09/05/2019   IR LUMBAR DISC ASPIRATION W/IMG GUIDE  11/26/2019   IR US GUIDE VASC ACCESS RIGHT  09/05/2019   RADIOLOGY WITH ANESTHESIA N/A 11/25/2019   Procedure: Thoracic and Lumbar MRI WITH ANESTHESIA;  Surgeon: Radiologist, Medication, MD;  Location: [facility];  Service: Radiology;  Laterality: N/A;    Home Medications:  Medications Prior to Admission  Medication Sig Dispense Refill Last Dose   acetaminophen (TYLENOL) 500 MG tablet Take 2 tablets (1,000 mg total) by mouth every 8 (eight) hours. (Patient taking differently: Take 1,000 mg by mouth every 8 (eight) hours as needed for moderate pain.) 30 tablet 0 Past Week   allopurinol (ZYLOPRIM) 100 MG tablet Take 100 mg by mouth daily.    09/16/2021   amLODipine (NORVASC) 5 MG tablet Take 5 tablets by mouth daily.   09/16/2021   atorvastatin (LIPITOR) 80 MG tablet Take 1 tablet (80 mg total) by mouth daily. 30 tablet 0 09/16/2021   colchicine 0.6 MG tablet Take 0.6 mg by mouth daily.   09/16/2021 at am   finasteride (PROSCAR) 5 MG tablet Take 1 tablet (5 mg total) by mouth daily. 30 tablet 0 09/16/2021 at am   glipiZIDE (GLUCOTROL) 10 MG tablet Take 10 mg by mouth daily before breakfast.   09/16/2021 at am   hydrALAZINE (APRESOLINE) 100 MG tablet Take 1 tablet (100 mg total) by mouth 3 (three) times daily. 90 tablet 0 09/16/2021 at noon   lactulose (CHRONULAC) 10 GM/15ML solution Take 15 mLs (10 g total) by mouth 2 (two) times daily. 236 mL 0 09/16/2021   levofloxacin (LEVAQUIN)  500 MG tablet Take 500 mg by mouth. Every 48 hours   09/15/2021 at am   levothyroxine (SYNTHROID) 50 MCG tablet Take 50 mcg by mouth daily before breakfast.    09/16/2021   methocarbamol (ROBAXIN) 750 MG tablet Take 1 tablet (750 mg total) by mouth every 8 (eight) hours as needed for muscle spasms. 40 tablet 0 09/15/2021 at am   naloxegol oxalate (MOVANTIK) 25 MG TABS tablet Take 1 tablet (25 mg total) by mouth daily. 30 tablet 0 09/16/2021   OXYCONTIN 30 MG 12 hr tablet Take  1 tablet (30 mg total) by mouth 2 (two) times daily. (Patient taking differently: Take 30 mg by mouth every 8 (eight) hours.) 6 tablet 0 09/16/2021 at 0800   tamsulosin (FLOMAX) 0.4 MG CAPS capsule Take 1 capsule (0.4 mg total) by mouth daily. 30 capsule 0 09/16/2021   vitamin B-12 (CYANOCOBALAMIN) 250 MCG tablet Take 250 mcg by mouth daily.   09/16/2021   [DISCONTINUED] cloNIDine (CATAPRES) 0.3 MG tablet Take 0.3 mg by mouth 3 (three) times daily.   09/16/2021 at noon   [DISCONTINUED] metoprolol tartrate (LOPRESSOR) 100 MG tablet Take 1 tablet (100 mg total) by mouth 2 (two) times daily. (Patient taking differently: Take 50 mg by mouth 2 (two) times daily.) 60 tablet 0 09/16/2021 at 0700   clotrimazole (LOTRIMIN) 1 % cream Apply topically 2 (two) times daily. (Patient not taking: Reported on 09/17/2021) 30 g 0 Not Taking   furosemide (LASIX) 40 MG tablet Take 40 mg by mouth daily.      gabapentin (NEURONTIN) 300 MG capsule Take 1 capsule (300 mg total) by mouth at bedtime. (Patient taking differently: Take 300 mg by mouth every 8 (eight) hours.) 20 capsule 0  at am   ZOFRAN 4 MG tablet Take 4 mg by mouth every 8 (eight) hours as needed for nausea or vomiting.  (Patient not taking: Reported on 09/17/2021)   Not Taking   Allergies:  Allergies  Allergen Reactions   Other Other (See Comments)    Blood pressure issues Per [facility]: Pt states he can only take these pain meds or else he gets very sick: Oxycontin,morphine,demerol, and dilaudid are the only pain meds pt states he can take.     Oxymorphone Other (See Comments)    Causes kidney problems   Aspirin Nausea And Vomiting    Per [facility]   Beta Vulgaris Nausea And Vomiting    Beets   Buspirone Other (See Comments)    Unknown reaction   Cabbage Nausea And Vomiting   Codeine Other (See Comments)    unknown   Dolobid [Diflunisal]     Unknown reaction   Fish Allergy Nausea And Vomiting   Fish-Derived Products Nausea And  Vomiting   Methadone     Unknown reaction   Pentazocine Other (See Comments)    Unknown reaction   Propoxyphene Other (See Comments)    Unknown reaction   Shellfish Allergy Nausea And Vomiting   Sulfa Antibiotics Hives   Sulfasalazine Other (See Comments)    Unknown reaction   Vistaril [Hydroxyzine]     Unknown reaction   Amoxicillin Rash    Per [facility]    Family History  Family history unknown: Yes   Social History:  reports that he has never smoked. He has never been exposed to tobacco smoke. He has never used smokeless tobacco. He reports that he does not drink alcohol and does not use drugs.  ROS: A complete review of  systems was performed.  All systems are negative except for pertinent findings as noted. ROS   Physical Exam:  Vital signs in last 24 hours: Temp:  [97.8 F (36.6 C)-98.9 F (37.2 C)] 98.9 F (37.2 C) (12/06 1646) Pulse Rate:  [57-148] 93 (12/06 1800) Resp:  [13-35] 22 (12/06 1800) BP: (76-196)/(51-149) 157/100 (12/06 1800) SpO2:  [93 %-100 %] 94 % (12/06 1800) FiO2 (%):  [30 %-60 %] 30 % (12/06 0737) Weight:  [118.6 kg] 118.6 kg (12/06 0500) General:  Alert and oriented, No acute distress HEENT: Normocephalic, atraumatic Neck: No JVD or lymphadenopathy Cardiovascular: Regular rate and rhythm Lungs: Regular rate and effort Abdomen: Soft, nontender, nondistended, no abdominal masses Back: No CVA tenderness Extremities: No edema Neurologic: Grossly intact Genitourinary: Circumcised phallus with Foley catheter in place draining clear yellow urine  Laboratory Data:  Results for orders placed or performed during the hospital encounter of 09/16/21 (from the past 24 hour(s))  Heparin level (unfractionated)     Status: None   Collection Time: 09/23/21  7:20 PM  Result Value Ref Range   Heparin Unfractionated 0.47 0.30 - 0.70 IU/mL  APTT     Status: Abnormal   Collection Time: 09/23/21  7:20 PM  Result Value Ref Range   aPTT 39 (H) 24 - 36  seconds  Magnesium     Status: None   Collection Time: 09/23/21  7:20 PM  Result Value Ref Range   Magnesium 2.0 1.7 - 2.4 mg/dL  Phosphorus     Status: None   Collection Time: 09/23/21  7:20 PM  Result Value Ref Range   Phosphorus 4.4 2.5 - 4.6 mg/dL  Glucose, capillary     Status: Abnormal   Collection Time: 09/23/21  7:46 PM  Result Value Ref Range   Glucose-Capillary 162 (H) 70 - 99 mg/dL  Draw ABG 1 hour after initiation of ventilator     Status: Abnormal   Collection Time: 09/23/21  8:00 PM  Result Value Ref Range   FIO2 60.00    Delivery systems VENTILATOR    Mode PRESSURE REGULATED VOLUME CONTROL    VT 550 mL   LHR 22 resp/min   Peep/cpap 5.0 cm H20   pH, Arterial 7.384 7.350 - 7.450   pCO2 arterial 34.9 32.0 - 48.0 mmHg   pO2, Arterial 110 (H) 83.0 - 108.0 mmHg   Bicarbonate 20.4 20.0 - 28.0 mmol/L   Acid-base deficit 3.5 (H) 0.0 - 2.0 mmol/L   O2 Saturation 98.0 %   Patient temperature 99.0    Allens test (pass/fail) PASS PASS  Glucose, capillary     Status: Abnormal   Collection Time: 09/23/21 11:12 PM  Result Value Ref Range   Glucose-Capillary 143 (H) 70 - 99 mg/dL  CBC     Status: Abnormal   Collection Time: 09/24/21  3:03 AM  Result Value Ref Range   WBC 7.9 4.0 - 10.5 K/uL   RBC 3.30 (L) 4.22 - 5.81 MIL/uL   Hemoglobin 10.1 (L) 13.0 - 17.0 g/dL   HCT 32.7 (L) 39.0 - 52.0 %   MCV 99.1 80.0 - 100.0 fL   MCH 30.6 26.0 - 34.0 pg   MCHC 30.9 30.0 - 36.0 g/dL   RDW 15.2 11.5 - 15.5 %   Platelets 260 150 - 400 K/uL   nRBC 0.0 0.0 - 0.2 %  Magnesium     Status: None   Collection Time: 09/24/21  3:03 AM  Result Value Ref Range   Magnesium 2.0  1.7 - 2.4 mg/dL  Phosphorus     Status: Abnormal   Collection Time: 09/24/21  3:03 AM  Result Value Ref Range   Phosphorus 5.3 (H) 2.5 - 4.6 mg/dL  Heparin level (unfractionated)     Status: None   Collection Time: 09/24/21  3:03 AM  Result Value Ref Range   Heparin Unfractionated 0.59 0.30 - 0.70 IU/mL  Glucose,  capillary     Status: Abnormal   Collection Time: 09/24/21  3:38 AM  Result Value Ref Range   Glucose-Capillary 132 (H) 70 - 99 mg/dL  Glucose, capillary     Status: Abnormal   Collection Time: 09/24/21  8:00 AM  Result Value Ref Range   Glucose-Capillary 135 (H) 70 - 99 mg/dL   Comment 1 Notify RN    Comment 2 Document in Chart   Glucose, capillary     Status: Abnormal   Collection Time: 09/24/21 11:47 AM  Result Value Ref Range   Glucose-Capillary 105 (H) 70 - 99 mg/dL   Comment 1 Notify RN    Comment 2 Document in Chart   Glucose, capillary     Status: Abnormal   Collection Time: 09/24/21  4:29 PM  Result Value Ref Range   Glucose-Capillary 125 (H) 70 - 99 mg/dL   Comment 1 Notify RN    Comment 2 Document in Chart    Recent Results (from the past 240 hour(s))  Blood culture (routine x 2)     Status: None   Collection Time: 09/16/21 10:45 PM   Specimen: BLOOD  Result Value Ref Range Status   Specimen Description   Final    BLOOD BLOOD RIGHT ARM Performed at [facility], [place] [address], [place], [place] [address]    Special Requests   Final    BOTTLES DRAWN AEROBIC AND ANAEROBIC Blood Culture adequate volume Performed at [facility] [address], [place], [place] [address]    Culture   Final    NO GROWTH 5 DAYS Performed at [facility], [place] [address] [place], [place] [address]    Report Status 09/22/2021 FINAL  Final  Resp Panel by RT-PCR (Flu A&B, Covid)     Status: None   Collection Time: 09/16/21 10:45 PM   Specimen: Nasopharyngeal(NP) swabs in vial transport medium  Result Value Ref Range Status   SARS Coronavirus 2 by RT PCR NEGATIVE NEGATIVE Final    Comment: (NOTE) SARS-CoV-2 target nucleic acids are NOT DETECTED.  The SARS-CoV-2 RNA is generally detectable in upper respiratory specimens during the acute phase of infection. The lowest concentration of SARS-CoV-2 viral copies this assay can detect is 138  copies/mL. A negative result does not preclude SARS-Cov-2 infection and should not be used as the sole basis for treatment or other patient management decisions. A negative result may occur with  improper specimen collection/handling, submission of specimen other than nasopharyngeal swab, presence of viral mutation(s) within the areas targeted by this assay, and inadequate number of viral copies(<138 copies/mL). A negative result must be combined with clinical observations, patient history, and epidemiological information. The expected result is Negative.  Fact Sheet for Patients:  EntrepreneurPulse.com.au  Fact Sheet for Healthcare Providers:  IncredibleEmployment.be  This test is no t yet approved or cleared by the [place] FDA and  has been authorized for detection and/or diagnosis of SARS-CoV-2 by FDA under an Emergency Use Authorization (EUA). This EUA will remain  in effect (meaning this test can be used)  for the duration of the COVID-19 declaration under Section 564(b)(1) of the Act, 21 U.S.C.section 360bbb-3(b)(1), unless the authorization is terminated  or revoked sooner.       Influenza A by PCR NEGATIVE NEGATIVE Final   Influenza B by PCR NEGATIVE NEGATIVE Final    Comment: (NOTE) The Xpert Xpress SARS-CoV-2/FLU/RSV plus assay is intended as an aid in the diagnosis of influenza from Nasopharyngeal swab specimens and should not be used as a sole basis for treatment. Nasal washings and aspirates are unacceptable for Xpert Xpress SARS-CoV-2/FLU/RSV testing.  Fact Sheet for Patients: EntrepreneurPulse.com.au  Fact Sheet for Healthcare Providers: IncredibleEmployment.be  This test is not yet approved or cleared by the [place] FDA and has been authorized for detection and/or diagnosis of SARS-CoV-2 by FDA under an Emergency Use Authorization (EUA). This EUA will remain in effect (meaning  this test can be used) for the duration of the COVID-19 declaration under Section 564(b)(1) of the Act, 21 U.S.C. section 360bbb-3(b)(1), unless the authorization is terminated or revoked.  Performed at [facility], [place] [address] [place], [place] [address]   Urine Culture     Status: None   Collection Time: 09/18/21 12:25 PM   Specimen: Urine, Clean Catch  Result Value Ref Range Status   Specimen Description   Final    URINE, CLEAN CATCH Performed at [facility], [place] [address] [place], [place] [address]    Special Requests   Final    NONE Performed at [facility], [place] [address], [place], [place] [address]    Culture   Final    NO GROWTH Performed at [facility], [place] [address], [place], [place] [address]    Report Status 09/19/2021 FINAL  Final  MRSA Next Gen by PCR, Nasal     Status: Abnormal   Collection Time: 09/21/21  6:16 AM   Specimen: Nasal Mucosa; Nasal Swab  Result Value Ref Range Status   MRSA by PCR Next Gen DETECTED (A) NOT DETECTED Final    Comment: RESULT CALLED TO, READ BACK BY AND VERIFIED WITH: HUGHUY, S. RN ON 09/21/2021 @ 0941 BY MECIAL J. (NOTE) The GeneXpert MRSA Assay (FDA approved for NASAL specimens only), is one component of a comprehensive MRSA colonization surveillance program. It is not intended to diagnose MRSA infection nor to guide or monitor treatment for MRSA infections. Test performance is not FDA approved in patients less than 11 years old. Performed at [facility], [place] [address] [place], [place] [address]    Creatinine: Recent Labs    09/18/21 3235 09/19/21 0506 09/20/21 0613 09/21/21 0700 09/22/21 0957 09/23/21 0952  CREATININE 3.51* 2.86* 2.35*  2.33* 2.22* 2.43* 2.54*    Impression/Assessment:  Right hydronephrosis Urinary retention Chronic renal insufficiency stage IV  Plan:  The right hydronephrosis is chronic and he has  right renal atrophy.  I doubt a ureteral stent would contribute much improvement at this point.  Foley catheter is now in place.  Recommend restarting Flomax and finasteride and keep the Foley catheter for about 1 week at which point he can undergo a voiding trial which can be done in the office if he is not still in the hospital.  We can get a renal ultrasound in a day or 2 to determine if there is persistent hydronephrosis.  He may be having reflux up the right ureter and hydronephrosis from his distended bladder.  The left is without hydronephrosis however.  Another  etiology could be stricture.  Marton Redwood, III 09/24/2021, 6:58 PM

## 2021-09-24 NOTE — Progress Notes (Signed)
Swallow evaluation completed, full report to follow.  Pt alert, able to feed self with assistance..  No focal cranial nerve deficits apparent and pt on room air.   Pt did not fully conduct 3 ounce [place] due to decreased comprehension - however swallowed nearly 2.5 ounces sequentially without difficulties.  Subtle cough x2 during entire intake of 5 ounces water, 3 ounces applesauce and 2 bites of graham cracker.  No increased work of breathing or negative vital sign changes.    Recommend pt have OT/PT consult (if not ordered) as he had some difficulty self feeding - appeared with questionable tremor-like movement of right hand.   Recommend pt have a dys3/thin diet *he has dentures at home and uses them for po.  Educated him to strict aspiration precautions.    Will follow up briefly to assure tolerance.  Informed RN and pt and posted swallow precaution signs.  Thank you for this consult.   Kathleen Lime, MS [facility] SLP Acute Rehab Services Office [phone] Pager [phone]

## 2021-09-24 NOTE — Progress Notes (Signed)
Echocardiogram 2D Echocardiogram has been performed.  Oneal Deputy Randal Goens RDCS 09/24/2021, 1:52 PM

## 2021-09-25 DIAGNOSIS — G894 Chronic pain syndrome: Secondary | ICD-10-CM | POA: Diagnosis not present

## 2021-09-25 DIAGNOSIS — I10 Essential (primary) hypertension: Secondary | ICD-10-CM

## 2021-09-25 DIAGNOSIS — J189 Pneumonia, unspecified organism: Secondary | ICD-10-CM | POA: Diagnosis not present

## 2021-09-25 DIAGNOSIS — N179 Acute kidney failure, unspecified: Secondary | ICD-10-CM | POA: Diagnosis not present

## 2021-09-25 DIAGNOSIS — N400 Enlarged prostate without lower urinary tract symptoms: Secondary | ICD-10-CM | POA: Diagnosis not present

## 2021-09-25 LAB — CBC WITH DIFFERENTIAL/PLATELET
Abs Immature Granulocytes: 0.06 10*3/uL (ref 0.00–0.07)
Basophils Absolute: 0 10*3/uL (ref 0.0–0.1)
Basophils Relative: 1 %
Eosinophils Absolute: 0.4 10*3/uL (ref 0.0–0.5)
Eosinophils Relative: 5 %
HCT: 33 % — ABNORMAL LOW (ref 39.0–52.0)
Hemoglobin: 9.9 g/dL — ABNORMAL LOW (ref 13.0–17.0)
Immature Granulocytes: 1 %
Lymphocytes Relative: 18 %
Lymphs Abs: 1.4 10*3/uL (ref 0.7–4.0)
MCH: 30.7 pg (ref 26.0–34.0)
MCHC: 30 g/dL (ref 30.0–36.0)
MCV: 102.2 fL — ABNORMAL HIGH (ref 80.0–100.0)
Monocytes Absolute: 0.7 10*3/uL (ref 0.1–1.0)
Monocytes Relative: 9 %
Neutro Abs: 5.1 10*3/uL (ref 1.7–7.7)
Neutrophils Relative %: 66 %
Platelets: 258 10*3/uL (ref 150–400)
RBC: 3.23 MIL/uL — ABNORMAL LOW (ref 4.22–5.81)
RDW: 15.3 % (ref 11.5–15.5)
WBC: 7.7 10*3/uL (ref 4.0–10.5)
nRBC: 0 % (ref 0.0–0.2)

## 2021-09-25 LAB — COMPREHENSIVE METABOLIC PANEL
ALT: 13 U/L (ref 0–44)
AST: 31 U/L (ref 15–41)
Albumin: 2.9 g/dL — ABNORMAL LOW (ref 3.5–5.0)
Alkaline Phosphatase: 73 U/L (ref 38–126)
Anion gap: 8 (ref 5–15)
BUN: 40 mg/dL — ABNORMAL HIGH (ref 8–23)
CO2: 23 mmol/L (ref 22–32)
Calcium: 7.1 mg/dL — ABNORMAL LOW (ref 8.9–10.3)
Chloride: 113 mmol/L — ABNORMAL HIGH (ref 98–111)
Creatinine, Ser: 2.75 mg/dL — ABNORMAL HIGH (ref 0.61–1.24)
GFR, Estimated: 24 mL/min — ABNORMAL LOW (ref >60)
Glucose, Bld: 122 mg/dL — ABNORMAL HIGH (ref 70–99)
Potassium: 4.1 mmol/L (ref 3.5–5.1)
Sodium: 144 mmol/L (ref 135–145)
Total Bilirubin: 0.7 mg/dL (ref 0.3–1.2)
Total Protein: 7 g/dL (ref 6.5–8.1)

## 2021-09-25 LAB — GLUCOSE, CAPILLARY
Glucose-Capillary: 109 mg/dL — ABNORMAL HIGH (ref 70–99)
Glucose-Capillary: 120 mg/dL — ABNORMAL HIGH (ref 70–99)
Glucose-Capillary: 126 mg/dL — ABNORMAL HIGH (ref 70–99)
Glucose-Capillary: 111 mg/dL — ABNORMAL HIGH (ref 70–99)
Glucose-Capillary: 108 mg/dL — ABNORMAL HIGH (ref 70–99)
Glucose-Capillary: 116 mg/dL — ABNORMAL HIGH (ref 70–99)

## 2021-09-25 LAB — HEPARIN LEVEL (UNFRACTIONATED)
Heparin Unfractionated: 0.75 IU/mL — ABNORMAL HIGH (ref 0.30–0.70)
Heparin Unfractionated: 0.62 IU/mL (ref 0.30–0.70)

## 2021-09-25 MED ORDER — APIXABAN 5 MG PO TABS
5.0000 mg | ORAL_TABLET | Freq: Two times a day (BID) | ORAL | Status: DC
  Administered 2021-09-25 – 2021-10-01 (×12): 5 mg via ORAL
  Filled 2021-09-25 (×12): qty 1

## 2021-09-25 MED ORDER — OXYCODONE HCL 5 MG PO TABS
5.0000 mg | ORAL_TABLET | ORAL | Status: DC | PRN
  Administered 2021-09-26 – 2021-10-01 (×3): 5 mg via ORAL
  Filled 2021-09-25 (×3): qty 1

## 2021-09-25 MED ORDER — CALCIUM GLUCONATE-NACL 1-0.675 GM/50ML-% IV SOLN
1.0000 g | Freq: Once | INTRAVENOUS | Status: AC
  Administered 2021-09-25: 1000 mg via INTRAVENOUS
  Filled 2021-09-25: qty 50

## 2021-09-25 NOTE — Progress Notes (Signed)
[facility] for lovenox --> Eliquis > Heparin gtt >> apixaban Indication: DVT  Patient Measurements: Height: 6\' 6"  (198.1 cm) Weight: 118.6 kg (261 lb 7.5 oz) IBW/kg (Calculated) : 91.4 Heparin Dosing Weight: 116 kg  Vital Signs: Temp: 98.4 F (36.9 C) (12/07 1600) Temp Source: Oral (12/07 1600) BP: 128/63 (12/07 1800) Pulse Rate: 77 (12/07 1800)  Labs: Recent Labs    09/22/21 2007 09/22/21 2007 09/23/21 0952 09/23/21 1920 09/24/21 0303 09/25/21 0321 09/25/21 1343  HGB  --    < > 11.7*  --  10.1* 9.9*  --   HCT  --   --  36.8*  --  32.7* 33.0*  --   PLT  --   --  339  --  260 258  --   APTT 58*  --   --  39*  --   --   --   HEPARINUNFRC >1.10*  --   --  0.47 0.59 0.75* 0.62  CREATININE  --   --  2.54*  --   --  2.75*  --    < > = values in this interval not displayed.    Estimated Creatinine Clearance: 37.2 mL/min (A) (by C-G formula based on SCr of 2.75 mg/dL (H)).  Assessment: Patient's a 30 M presented to the ED on 11/28 with c/o LE swelling and pain. LE doppler on 11/29 showed age indeterminate DVT of L femoral vein. Pt was initially anticoagulated with LMWH and transitioned to apixaban. Apixaban was  subsequently transitioned to IV UFH on 12/4 due to inability to take PO medications and need for EGD.   Pharmacy consulted to transition anticoagulation back to apixaban this evening.   Significant Events: - 12/5 head CT: Motion limited study with new hypoattenuation in the left occipital lobe. While this could represent artifact, acute infarct is not excluded.  Today, 09/25/2021: Discussed anticoagulation with MD. Patient has had a total of 7 days of therapeutic anticoagulation (LMWH, apixaban, and IV UFH). Given this information in addition to patients history of GIB as well as indication of age-indeterminate DVT - will not restart apixaban loading dose.   Plan: Turn off heparin drip at time that apixaban is initiated Apixaban  5 mg PO BID CBC with AM labs tomorrow Patient will need medication counseling as well as manufacturer coupon prior to discharge.  Lenis Noon, PharmD 09/25/21 7:03 PM

## 2021-09-25 NOTE — Progress Notes (Signed)
Physical Therapy Treatment Patient Details Name: Thomas [name] MRN: [id number] DOB: July 17, 1951 Today's Date: 09/25/2021   History of Present Illness Patient is a 70 year old male who presented to hospital after brother called EMS. patient was found to have AKI, CAP,and LLE edema.  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    PT Comments    The patient  reports back pain, RN aware. Patient participated in bed mobility , sitting on bed edge.  Patient donned right prosthetic, assisted with L shoe. Patient stood  for a few seconds, requiring max assistance of 2 persons and  bed raised. Patient reports feeling dizzy. Throughout treatment. Blood pressures: Supine in bed: 198/88 mmhg Sitting: 153/83 mmhg Sitting after standing attempt: 139/94 mmhg patient reported dizziness with transitions. Patient had BP meds just prior to movement  Patient able to assist self back into bed.RN aware. Continue PT for mobility/transfers/gait.  Recommendations for follow up therapy are one component of a multi-disciplinary discharge planning process, led by the attending physician.  Recommendations may be updated based on patient status, additional functional criteria and insurance authorization.  Follow Up Recommendations  Skilled nursing-short term rehab (<3 hours/day)     Assistance Recommended at Discharge Frequent or constant Supervision/Assistance  Equipment Recommendations  None recommended by PT    Recommendations for Other Services       Precautions / Restrictions Precautions Precautions: Fall Precaution Comments: R BKA, Lt foot wounds Restrictions Other Position/Activity Restrictions: prosthesis and shoe in rroom     Mobility  Bed Mobility Overal bed mobility: Needs Assistance Bed Mobility: Supine to Sit;Sit to Supine Rolling: Supervision   Supine to sit: Min assist;HOB elevated Sit to supine: Max  assist;+2 for safety/equipment   General bed mobility comments: pt able to initiate bring LE's off EOB and using bed rail to raise trunk upright. light assist/guard to fully sit up to EOB. pt able to donn prosthesis while sitting EOB without assist, guarding for safety. pt c/o nausea and experienced 1 bout of emesis (mucous consitency). Pt continued to c/o nausea and started to shake slightly. Max assist +2 for safety to return to supine. Pt then able to use overhead bar to pull self superiorly in bed to reposition. He then experienced increased tremors/shaking and speach became more garbled. RN called and RRT called. Vitals taken (see details in clinical impression).    Transfers Overall transfer level: Needs assistance Equipment used: Rolling walker (2 wheels) Transfers: Sit to/from Stand Sit to Stand: Max assist;+2 physical assistance;+2 safety/equipment           General transfer comment: patient reported onset of dizziness with standing at edge of bed with RW. patient was returned to bed at this time.    Ambulation/Gait                   Stairs             Wheelchair Mobility    Modified Rankin (Stroke Patients Only)       Balance Overall balance assessment: Needs assistance Sitting-balance support: No upper extremity supported;Feet supported Sitting balance-[name]: Fair     Standing balance support: During functional activity;Bilateral upper extremity supported;Reliant on assistive device for balance Standing balance-[name]: Poor Standing balance comment: tolerated a few seconds  to stand  Cognition Arousal/Alertness: Awake/alert Behavior During Therapy: Flat affect                                   General Comments: patient was able to participate in tasks but noted to not be able to carryover orientation provided to patient one minute later. patient was fixated on asking the same  questions during sesison.        Exercises      General Comments        Pertinent Vitals/Pain Faces Pain Scale: Hurts whole lot Pain Location: back Pain Descriptors / Indicators: Discomfort;Aching Pain Intervention(s): Patient requesting pain meds-RN notified;Limited activity within patient's tolerance    Home Living                          Prior Function            PT Goals (current goals can now be found in the care plan section) Progress towards PT goals: Progressing toward goals    Frequency    Min 2X/week      PT Plan Current plan remains appropriate;Frequency needs to be updated    Co-evaluation PT/OT/SLP Co-Evaluation/Treatment: Yes Reason for Co-Treatment: For patient/therapist safety PT goals addressed during session: Mobility/safety with mobility OT goals addressed during session: ADL's and self-care      AM-PAC PT "6 Clicks" Mobility   Outcome Measure  Help needed turning from your back to your side while in a flat bed without using bedrails?: A Little Help needed moving from lying on your back to sitting on the side of a flat bed without using bedrails?: A Little Help needed moving to and from a bed to a chair (including a wheelchair)?: Total Help needed standing up from a chair using your arms (e.g., wheelchair or bedside chair)?: Total Help needed to walk in hospital room?: Total Help needed climbing 3-5 steps with a railing? : Total 6 Click Score: 10    End of Session Equipment Utilized During Treatment: Gait belt Activity Tolerance: Patient limited by fatigue;Patient limited by pain Patient left: in bed;with bed alarm set;with call bell/phone within reach Nurse Communication: Mobility status PT Visit Diagnosis: Unsteadiness on feet (R26.81);Difficulty in walking, not elsewhere classified (R26.2);Muscle weakness (generalized) (M62.81)     Time: 7858-8502 PT Time Calculation (min) (ACUTE ONLY): 25 min  Charges:  $Therapeutic  Activity: 8-22 mins                     Tresa Endo PT Acute Rehabilitation Services Pager [phone] Office [phone]    Claretha Cooper 09/25/2021, 4:55 PM

## 2021-09-25 NOTE — TOC Progression Note (Signed)
Transition of Care [facility]) - Progression Note    Patient Details  Name: Thomas [name] MRN: [id number] Date of Birth: 1951/10/01  Transition [facility]) CM/SW [place], [place] Phone Number: 09/25/2021, 3:31 PM  Clinical Narrative:   Patient seen in follow up to OT note today reiterating SNF recommendation.  [name] admits to increased weakness.  "The only thing I have eaten in the last 3 days is applesauce.  This illness is kicking my tail."  He has been to multiple SNFs in the [place] area, the only one he is willing to consider is [name]. Information sent to same with hopes of bed offer. TOC will continue to follow during the course of hospitalization.     Expected Discharge Plan: Skilled Nursing Facility Barriers to Discharge: SNF Pending bed offer  Expected Discharge Plan and Services Expected Discharge Plan: [place]   Discharge Planning Services: CM Consult Post Acute Care Choice: [place] arrangements for the past 2 months: Apartment Expected Discharge Date: 09/21/21                         HH Arranged: RN, PT, OT, Nurse's Aide, Social Work [employer] Agency: [place] Date [facility] Agency Contacted: 09/20/21 Time [place]: [place] Representative spoke with at [place]: [place] ([place]) Interventions    Readmission Risk Interventions Readmission Risk Prevention Plan 09/20/2021 09/17/2019  Transportation Screening Complete Complete  PCP or Specialist Appt within 5-7 Days - Not Complete  Not Complete comments - snf vs home  PCP or Specialist Appt within 3-5 Days Complete -  Home Care Screening - Complete  Medication Review (RN CM) - Complete  HRI or Home Care Consult Complete -  Social Work Consult for [place] Planning/Counseling Complete -  Palliative Care Screening Not Applicable -  Medication Review [employer]) Complete -  Some recent data might be hidden

## 2021-09-25 NOTE — Progress Notes (Signed)
Report given to [employer] on 5E.  Pt transported by bed with 3L [place];  Pt denies pain, discomfort and without s/s or c/o respiratory distress.  All belongings transported with pt, including orthotic shoe and prosthetic leg.

## 2021-09-25 NOTE — Progress Notes (Signed)
Occupational Therapy Treatment Patient Details Name: Thomas [name] MRN: [id number] DOB: 1951/04/15 Today's Date: 09/25/2021   History of present illness Patient is a 70 year old male who presented to hospital after brother called EMS. patient was found to have AKI, CAP,and LLE edema.  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   OT comments  Patient reported pain in back at start of session. Nurse had provided all allotted pain medication at this time. Patient was able to don prosthesis sitting on edge of bed with min guard and set up to complete task. Patient attempted standing with max A x 2 with RW with patient able to stand for about 20 seconds or so with onset of dizziness. Patient unable to engage in transfers safely at this time impacting safety to transition home. Patient's discharge plan remains appropriate at this time. OT will continue to follow acutely.    Blood pressures: Supine in bed: 198/88 mmhg Sitting: 153/83 mmhg Sitting after standing attempt: 139/94 mmhg patient reported dizziness with transitions. Patient had BP meds just prior to movement   Recommendations for follow up therapy are one component of a multi-disciplinary discharge planning process, led by the attending physician.  Recommendations may be updated based on patient status, additional functional criteria and insurance authorization.    Follow Up Recommendations  Skilled nursing-short term rehab (<3 hours/day)    Assistance Recommended at Discharge Frequent or constant Supervision/Assistance  Equipment Recommendations  None recommended by OT    Recommendations for Other Services      Precautions / Restrictions Precautions Precautions: Fall Precaution Comments: R BKA, Lt foot wounds Restrictions Weight Bearing Restrictions: No       Mobility Bed Mobility Overal bed mobility: Needs Assistance Bed Mobility: Supine to  Sit;Sit to Supine     Supine to sit: Min assist;HOB elevated          Transfers Overall transfer level: Needs assistance Equipment used: Rolling walker (2 wheels) Transfers: Sit to/from Stand Sit to Stand: Max assist;+2 physical assistance;+2 safety/equipment           General transfer comment: patient reported onset of dizziness with standing at edge of bed with RW. patient was returned to bed at this time.     Balance Overall balance assessment: Needs assistance Sitting-balance support: No upper extremity supported;Feet supported Sitting balance-[name]: Fair                                     ADL either performed or assessed with clinical judgement   ADL Overall ADL's : Needs assistance/impaired                       Lower Body Dressing Details (indicate cue type and reason): patient was able to don/doff prostesis on RLE with increased time sitting on edge of bed with min guard. patient was able to clean prostesis per patient request prior to use as well sitting on edge of bed with min guard.               General ADL Comments: patient attempted standing with max A x 2 with RW but reported dizziness and returned to bed.    Extremity/Trunk Assessment              Vision       Perception  Praxis      Cognition Arousal/Alertness: Awake/alert Behavior During Therapy: Flat affect Overall Cognitive Status: No family/caregiver present to determine baseline cognitive functioning                                 General Comments: patient was able to participate in tasks but noted to not be able to carryover orientation provided to patient one minute later. patient was fixated on asking the same questions during sesison.          Exercises     Shoulder Instructions       General Comments      Pertinent Vitals/ Pain       Pain Assessment: Faces Pain Descriptors / Indicators: Discomfort Pain  Intervention(s): Monitored during session  Home Living                                          Prior Functioning/Environment              Frequency  Min 2X/week        Progress Toward Goals  OT Goals(current goals can now be found in the care plan section)  Progress towards OT goals: Progressing toward goals     Plan Discharge plan remains appropriate    Co-evaluation    PT/OT/SLP Co-Evaluation/Treatment: Yes Reason for Co-Treatment: For patient/therapist safety;To address functional/ADL transfers PT goals addressed during session: Mobility/safety with mobility OT goals addressed during session: ADL's and self-care      AM-PAC OT "6 Clicks" Daily Activity     Outcome Measure   Help from another person eating meals?: A Little Help from another person taking care of personal grooming?: A Little Help from another person toileting, which includes using toliet, bedpan, or urinal?: A Lot Help from another person bathing (including washing, rinsing, drying)?: A Lot Help from another person to put on and taking off regular upper body clothing?: A Little Help from another person to put on and taking off regular lower body clothing?: A Lot 6 Click Score: 15    End of Session Equipment Utilized During Treatment: Gait belt;Rolling walker (2 wheels)  OT Visit Diagnosis: Unsteadiness on feet (R26.81);Muscle weakness (generalized) (M62.81)   Activity Tolerance Patient limited by lethargy;Patient limited by fatigue   Patient Left in bed;with call bell/phone within reach;with bed alarm set   Nurse Communication Mobility status;Patient requests pain meds        Time: 0833-0909 OT Time Calculation (min): 36 min  Charges: OT General Charges $OT Visit: 1 Visit OT Treatments $Self Care/Home Management : 8-22 mins  Jackelyn Poling OTR/L, MS Acute Rehabilitation Department Office# [phone] Pager# [phone]   Marcellina Millin 09/25/2021, 12:36 PM

## 2021-09-25 NOTE — Progress Notes (Signed)
PROGRESS NOTE  Thomas [name]:[id number] DOB: 11-09-1950 DOA: 09/16/2021 PCP: Thomas Jewel, MD  HPI/Recap of past 24 hours: Thomas [name] is an 70 y.o. male past medical history of DM type 2, HTN, CKD stage IV, chronic normocytic anemia, brought into the ED from home via EMS due to concern by his brother Thomas [name] not looking well, not eating or taking his medications for several days.  Upon EMS arrival, his blood glucose was 57 was given D10.  Work-up revealed AKI on CKD 4, suspected prerenal secondary to dehydration.  Also revealed left lower lobe community-acquired pneumonia, left lower extremity cellulitis and age-indeterminate left lower extremity DVT.  MRI left foot could not rule out second digit osteomyelitis. He received empiric IV antibiotics x 6 days.  MRI thoracic spine 09/17/2021 did not show evidence of discitis. Hospital course complicated by intractable nausea and vomiting for which GI was consulted.  Abdominal x-ray was unrevealing.  Planned for EGD, but canceled due to severe delirium.  PCCM consulted on 12/5 due to significant worsening of delirium and combativeness with intermittent unresponsiveness, and hypertensive crisis.  Patient was intubated on 12/5 and extubated on 12/6.  Patient was further stabilized in the ICU and [facility] assumed care on 09/25/2021.    Today, patient denies any new complaints, reporting that he just wanted to rest.  Denies any chest pain, abdominal pain, nausea/vomiting, fever/chills.  Patient alert, oriented with stable vital signs.  Stable to transfer down to telemetry.   Assessment/Plan: Principal Problem:   ARF (acute renal failure) (HCC) Active Problems:   [name], right ([place])   [name], type 2) ([place])   HTN (hypertension)   BPH (benign prostatic hyperplasia)   Chronic pain syndrome   AKI (acute kidney injury) ([place])   CAP (community acquired pneumonia)   Delirium/Acute metabolic encephalopathy Noted for  hypertensive emergency/PRES Resolved CT head could not rule out left occipital lobe infarct due to motion Consider MRI if encephalopathic again Strict BP control Delirium precautions  Hypertensive emergency-resolved Hx of HTN BP improving Continue clonidine patch, hydralazine, amlodipine, metoprolol Monitor closely  Acute respiratory failure with hypoxemia due to inability to protect airway Resolved Required intubation on 12/5, extubated on 12/6 Currently on 3 L of O2, plan to wean off Monitor closely  Treated LLL CAP, POA Completed 6 days of Rocephin and 5 days of azithromycin O2 saturation 98% 3LNC Wean off O2 suppl as tolerated  Acute kidney injury on chronic kidney disease stage IV Improving Baseline creatinine around 2.4, on admission 4.62 Continue to hold off home Lasix Daily BMP  Chronic systolic HF Currently appears euvolemic Echo done showed EF of 35 to 40%, left ventricle global hypokinesis, severely elevated pulmonary artery systolic pressure Holding home Lasix for now, consider restarting soon pending creatinine  Left lower extremity age-indeterminate DVT, seen on Doppler ultrasound 09/17/2021, POA Due to significant delirium and possibility of EGD, patient was started on heparin drip on 09/22/2021 Plan to switch back to p.o. Eliquis   Hypocalcemia Replace as needed   Moderate to severe R hydroureteronephrosis seen on CT scan Increased compared to prior exam, no evidence of obstructing calculus Management per urology-Foley catheter for about 1 week, and attempt voiding trial, possible stricture.  Ureteral stent not recommended Continue Flomax, finasteride   Treated left lower extremity cellulitis, POA Received 6 days of empiric IV antibiotics MRI of the left foot, osteomyelitis of second digit could not be completely ruled out, unlikely per podiatry, Dr.  Posey Pronto  Seen by podiatry Dr. Posey Pronto, plan to follow-up outpatient.  No evidence of infection or  osteomyelitis to the second digits.  No local wound care indicated.  Okay to be discharged from podiatry standpoint.  Will need to follow-up with podiatry outpatient. MRSA screen positive 09/21/21   History of upper GI bleed with ground coffee emesis/LA grade A seen on EGD done on 11/25/2019 by Dr. Benson Norway  Continue IV Protonix 40 mg twice daily. Hemoglobin stable No overt bleeding Planned for EGD on 09/23/2021, canceled due to delirium Appreciate GIs assistance.   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  Diabetes mellitus type 2 Hemoglobin A1c 6.0 09/20/21 ISS  Anemia of chronic renal disease/iron deficiency anemia: Iron deficiency noted on iron studies. Hemoglobin stable Continue iron supplement  Chronic pain Continue pain management and bowel regimen   Physical debility PT assessed and recommended SNF Continue PT OT with assistance and fall precautions.   Obesity BMI 30 Recommend weight loss outpatient with regular physical activity and healthy dieting.   Dry eyes/left conjunctivitis, suspect bacterial Cipro eyedrops.    Estimated body mass index is 30.22 kg/m as calculated from the following:   Height as of this encounter: 6\' 6"  (1.981 m).   Weight as of this encounter: 118.6 kg.     Code Status: Full  Family Communication: None at bedside  Disposition Plan: Status is: Inpatient  Remains inpatient appropriate because: Level of care        Consultants: Podiatry GI PCCM   Procedures: Mechanical ventilation on 12/5-12/6  Antimicrobials: Currently none  DVT prophylaxis: Heparin drip, plan to switch to p.o. Eliquis   Objective: Vitals:   09/25/21 1400 09/25/21 1428 09/25/21 1500 09/25/21 1600  BP: (!) 113/54 (!) 113/54 132/64 113/64  Pulse: 79 83 81 78  Resp: (!) 21 18 19 15   Temp:    98.4 F (36.9 C)  TempSrc:    Oral  SpO2: 98% 99% 98% 95%  Weight:       Height:        Intake/Output Summary (Last 24 hours) at 09/25/2021 1631 Last data filed at 09/25/2021 1627 Gross per 24 hour  Intake 1750.04 ml  Output 2450 ml  Net -699.96 ml   Filed Weights   09/18/21 0453 09/22/21 0420 09/24/21 0500  Weight: 132 kg 121.3 kg 118.6 kg    Exam: General: NAD  Cardiovascular: S1, S2 present Respiratory: CTAB Abdomen: Soft, nontender, nondistended, bowel sounds present Musculoskeletal: No bilateral pedal edema noted, right BKA Skin: Normal Psychiatry: Normal mood    Data Reviewed: CBC: Recent Labs  Lab 09/20/21 0613 09/22/21 0957 09/23/21 0952 09/24/21 0303 09/25/21 0321  WBC 5.6 7.4 12.6* 7.9 7.7  NEUTROABS  --  5.8 11.1*  --  5.1  HGB 9.1* 10.3* 11.7* 10.1* 9.9*  HCT 29.1* 31.6* 36.8* 32.7* 33.0*  MCV 100.0 96.3 95.6 99.1 102.2*  PLT 238 266 339 260 623   Basic Metabolic Panel: Recent Labs  Lab 09/20/21 0613 09/21/21 0700 09/22/21 0957 09/23/21 0952 09/23/21 1920 09/24/21 0303 09/25/21 0321  NA 138  --  140 139  --   --  144  K 4.9  --  4.0 3.9  --   --  4.1  CL 109  --  109 110  --   --  113*  CO2 22  --  25 17*  --   --  23  GLUCOSE  111*  --  109* 181*  --   --  122*  BUN 30*  --  24* 28*  --   --  40*  CREATININE 2.35*  2.33* 2.22* 2.43* 2.54*  --   --  2.75*  CALCIUM 7.5*  --  7.5* 7.6*  --   --  7.1*  MG  --   --  1.4* 1.7 2.0 2.0  --   PHOS 3.8  --  3.3 3.8 4.4 5.3*  --    GFR: Estimated Creatinine Clearance: 37.2 mL/min (A) (by C-G formula based on SCr of 2.75 mg/dL (H)). Liver Function Tests: Recent Labs  Lab 09/20/21 0613 09/22/21 0957 09/23/21 0952 09/25/21 0321  AST  --  42* 49* 31  ALT  --  17 20 13   ALKPHOS  --  88 94 73  BILITOT  --  0.6 1.1 0.7  PROT  --  7.1 7.9 7.0  ALBUMIN 2.8* 3.0* 3.3* 2.9*   Recent Labs  Lab 09/22/21 0957  LIPASE 32   Recent Labs  Lab 09/23/21 1542  AMMONIA 22   Coagulation Profile: No results for input(s): INR, PROTIME in the last 168 hours. Cardiac  Enzymes: No results for input(s): CKTOTAL, CKMB, CKMBINDEX, TROPONINI in the last 168 hours. BNP (last 3 results) No results for input(s): PROBNP in the last 8760 hours. HbA1C: No results for input(s): HGBA1C in the last 72 hours. CBG: Recent Labs  Lab 09/24/21 2305 09/25/21 0347 09/25/21 0746 09/25/21 1143 09/25/21 1533  GLUCAP 119* 109* 120* 126* 111*   Lipid Profile: No results for input(s): CHOL, HDL, LDLCALC, TRIG, CHOLHDL, LDLDIRECT in the last 72 hours. Thyroid Function Tests: Recent Labs    09/23/21 1542  TSH 1.444   Anemia Panel: No results for input(s): VITAMINB12, FOLATE, FERRITIN, TIBC, IRON, RETICCTPCT in the last 72 hours. Urine analysis:    Component Value Date/Time   COLORURINE YELLOW 09/17/2021 0808   APPEARANCEUR HAZY (A) 09/17/2021 0808   LABSPEC 1.011 09/17/2021 0808   PHURINE 5.0 09/17/2021 0808   GLUCOSEU NEGATIVE 09/17/2021 0808   HGBUR NEGATIVE 09/17/2021 0808   BILIRUBINUR NEGATIVE 09/17/2021 0808   KETONESUR NEGATIVE 09/17/2021 0808   PROTEINUR 100 (A) 09/17/2021 0808   NITRITE NEGATIVE 09/17/2021 0808   LEUKOCYTESUR LARGE (A) 09/17/2021 0808   Sepsis Labs: @LABRCNTIP (procalcitonin:4,lacticidven:4)  ) Recent Results (from the past 240 hour(s))  Blood culture (routine x 2)     Status: None   Collection Time: 09/16/21 10:45 PM   Specimen: BLOOD  Result Value Ref Range Status   Specimen Description   Final    BLOOD BLOOD RIGHT ARM Performed at [facility] [address], [place], [place] [address]    Special Requests   Final    BOTTLES DRAWN AEROBIC AND ANAEROBIC Blood Culture adequate volume Performed at [facility] [address] [place], [place] [address]    Culture   Final    NO GROWTH 5 DAYS Performed at [facility], [place] [address], [place], [place] [address]    Report Status 09/22/2021 FINAL  Final  Resp Panel by RT-PCR (Flu A&B, Covid)     Status: None   Collection Time:  09/16/21 10:45 PM   Specimen: Nasopharyngeal(NP) swabs in vial transport medium  Result Value Ref Range Status   SARS Coronavirus 2 by RT PCR NEGATIVE NEGATIVE Final    Comment: (NOTE) SARS-CoV-2 target nucleic acids are NOT DETECTED.  The SARS-CoV-2 RNA is generally detectable  in upper respiratory specimens during the acute phase of infection. The lowest concentration of SARS-CoV-2 viral copies this assay can detect is 138 copies/mL. A negative result does not preclude SARS-Cov-2 infection and should not be used as the sole basis for treatment or other patient management decisions. A negative result may occur with  improper specimen collection/handling, submission of specimen other than nasopharyngeal swab, presence of viral mutation(s) within the areas targeted by this assay, and inadequate number of viral copies(<138 copies/mL). A negative result must be combined with clinical observations, patient history, and epidemiological information. The expected result is Negative.  Fact Sheet for Patients:  EntrepreneurPulse.com.au  Fact Sheet for Healthcare Providers:  IncredibleEmployment.be  This test is no t yet approved or cleared by the [place] FDA and  has been authorized for detection and/or diagnosis of SARS-CoV-2 by FDA under an Emergency Use Authorization (EUA). This EUA will remain  in effect (meaning this test can be used) for the duration of the COVID-19 declaration under Section 564(b)(1) of the Act, 21 U.S.C.section 360bbb-3(b)(1), unless the authorization is terminated  or revoked sooner.       Influenza A by PCR NEGATIVE NEGATIVE Final   Influenza B by PCR NEGATIVE NEGATIVE Final    Comment: (NOTE) The Xpert Xpress SARS-CoV-2/FLU/RSV plus assay is intended as an aid in the diagnosis of influenza from Nasopharyngeal swab specimens and should not be used as a sole basis for treatment. Nasal washings and aspirates are  unacceptable for Xpert Xpress SARS-CoV-2/FLU/RSV testing.  Fact Sheet for Patients: EntrepreneurPulse.com.au  Fact Sheet for Healthcare Providers: IncredibleEmployment.be  This test is not yet approved or cleared by the [place] FDA and has been authorized for detection and/or diagnosis of SARS-CoV-2 by FDA under an Emergency Use Authorization (EUA). This EUA will remain in effect (meaning this test can be used) for the duration of the COVID-19 declaration under Section 564(b)(1) of the Act, 21 U.S.C. section 360bbb-3(b)(1), unless the authorization is terminated or revoked.  Performed at [facility], [place] [address], [place], [place] [address]   Urine Culture     Status: None   Collection Time: 09/18/21 12:25 PM   Specimen: Urine, Clean Catch  Result Value Ref Range Status   Specimen Description   Final    URINE, CLEAN CATCH Performed at [facility], [place] [address] [place], [place] [address]    Special Requests   Final    NONE Performed at [facility], [place] [address], [place], [place] [address]    Culture   Final    NO GROWTH Performed at [facility], [place] [address], [place], [place] [address]    Report Status 09/19/2021 FINAL  Final  MRSA Next Gen by PCR, Nasal     Status: Abnormal   Collection Time: 09/21/21  6:16 AM   Specimen: Nasal Mucosa; Nasal Swab  Result Value Ref Range Status   MRSA by PCR Next Gen DETECTED (A) NOT DETECTED Final    Comment: RESULT CALLED TO, READ BACK BY AND VERIFIED WITH: HUGHUY, S. RN ON 09/21/2021 @ 0941 BY MECIAL J. (NOTE) The GeneXpert MRSA Assay (FDA approved for NASAL specimens only), is one component of a comprehensive MRSA colonization surveillance program. It is not intended to diagnose MRSA infection nor to guide or monitor treatment for MRSA infections. Test performance is not FDA approved in patients less than 6  years old. Performed at [facility], [place] [address] [place],  [place] [address]       Studies: No results found.  Scheduled Meds:  allopurinol  100 mg Oral Daily   amLODipine  5 mg Oral Daily   atorvastatin  80 mg Oral Daily   bisacodyl  10 mg Rectal Once   chlorhexidine  15 mL Mouth Rinse BID   Chlorhexidine Gluconate Cloth  6 each Topical Q0600   ciprofloxacin  2 drop Both Eyes Q4H while awake   cloNIDine  0.3 mg Transdermal Weekly   ferrous sulfate  325 mg Oral Q breakfast   finasteride  5 mg Oral Daily   gabapentin  300 mg Oral TID   hydrALAZINE  100 mg Oral Q8H   insulin aspart  0-9 Units Subcutaneous TID WC   lactulose  10 g Oral BID   levothyroxine  50 mcg Per Tube Q0600   mouth rinse  15 mL Mouth Rinse q12n4p   methocarbamol  750 mg Oral TID   metoprolol tartrate  50 mg Oral BID   mupirocin ointment  1 application Nasal BID   naloxegol oxalate  12.5 mg Per NG tube Daily   oxyCODONE  30 mg Oral Q12H   pantoprazole (PROTONIX) IV  40 mg Intravenous Q12H   saccharomyces boulardii  250 mg Oral BID   senna-docusate  1 tablet Oral BID   simethicone  80 mg Oral QID   tamsulosin  0.4 mg Oral Daily   vitamin B-12  200 mcg Oral Daily    Continuous Infusions:  dextrose 5 % and 0.9% NaCl 30 mL/hr at 09/25/21 1627   heparin 1,900 Units/hr (09/25/21 1627)   methocarbamol (ROBAXIN) IV Stopped (09/24/21 1459)   promethazine (PHENERGAN) injection (IM or IVPB) Stopped (09/25/21 1036)     LOS: 8 days     Alma Friendly, MD [facility]  If 7PM-7AM, please contact night-coverage [email or web address] 09/25/2021, 4:31 PM

## 2021-09-25 NOTE — NC FL2 (Signed)
[place] LEVEL OF CARE SCREENING TOOL     IDENTIFICATION  Patient Name: Thomas [name] Birthdate: 1951/03/23 Sex: male Admission Date (Current Location): 09/16/2021  [facility] and [place] Number:  Herbalist and Address:  [facility],  [place], [place]      Provider Number: [id number]  Attending Physician Name and Address:  Alma Friendly, MD  Relative Name and Phone Number:  [place], Sells [facility])   [phone]    Current Level of Care: Hospital Recommended Level of Care: [place] Prior Approval Number:    Date Approved/Denied:   PASRR Number: [id number]  Discharge Plan: SNF    Current Diagnoses: Patient Active Problem List   Diagnosis Date Noted   ARF (acute renal failure) ([place]) 09/17/2021   CAP (community acquired pneumonia) 09/17/2021   Cellulitis of left lower extremity    Pressure injury of skin 06/03/2021   AKI (acute kidney injury) ([place]) 06/02/2021   Pain management    Cord compression ([place])    Drug-induced constipation    Acute midline low back pain with sciatica    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 ([place]) 08/30/2020   Urinary retention 12/04/2019   BPH (benign prostatic hyperplasia) 11/24/2019   CKD (chronic kidney disease), stage IV ([place]) 11/24/2019   Chronic pain syndrome 11/24/2019   Physical deconditioning 11/24/2019   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[place]) 08/31/2019   Pulmonary emboli ([place]) 09/29/2018   [name], right ([place]) 09/29/2018   [name], type 2) ([place]) 09/29/2018   Gout 09/29/2018   HTN (hypertension) 09/29/2018   CHF (congestive heart failure), NYHA class I, chronic, diastolic ([place]) 79/39/0300   GERD (gastroesophageal reflux disease) 09/29/2018    Orientation RESPIRATION BLADDER Height & Weight     Self, Time, Situation, Place  O2 (3L [place]) External catheter Weight: 118.6 kg Height:  6\' 6"  (198.1 cm)  BEHAVIORAL SYMPTOMS/MOOD  NEUROLOGICAL BOWEL NUTRITION STATUS      Continent Diet (see d/c summary)  AMBULATORY STATUS COMMUNICATION OF NEEDS Skin   Extensive Assist Verbally Normal                       Personal Care Assistance Level of Assistance  Bathing, Feeding, Dressing Bathing Assistance: Maximum assistance Feeding assistance: Independent Dressing Assistance: Limited assistance     Functional Limitations Info  Sight, Hearing, Speech Sight Info: Adequate Hearing Info: Adequate Speech Info: Adequate    SPECIAL CARE FACTORS FREQUENCY  PT (By licensed PT), OT (By licensed OT)     PT Frequency: 5X/W OT Frequency: 5X/W            Contractures Contractures Info: Not present    Additional Factors Info  Code Status, Allergies Code Status Info: full Allergies Info: Oxymorphone, Aspirin, Beta Vulgaris, Buspirone, Cabbage, Codeine, Dolobid (Diflunisal), Fish Allergy, Fish-derived Products, Methadone, Pentazocine, Propoxyphene, Shellfish Allergy, Sulfa Antibiotics, Sulfasalazine, Vistaril (Hydroxyzine), Amoxicillin           Current Medications (09/25/2021):  This is the current hospital active medication list Current Facility-Administered Medications  Medication Dose Route Frequency Provider Last Rate Last Admin   acetaminophen (TYLENOL) tablet 650 mg  650 mg Oral Q6H PRN Dimple Nanas, RPH       Or   acetaminophen (TYLENOL) suppository 650 mg  650 mg Rectal Q6H PRN Dimple Nanas, RPH       allopurinol (ZYLOPRIM) tablet 100 mg  100 mg Oral Daily Dimple Nanas,  RPH   100 mg at 09/25/21 1036   amLODipine (NORVASC) tablet 5 mg  5 mg Oral Daily Simonne Maffucci B, MD   5 mg at 09/25/21 1037   atorvastatin (LIPITOR) tablet 80 mg  80 mg Oral Daily Dimple Nanas, RPH   80 mg at 09/25/21 1036   bisacodyl (DULCOLAX) suppository 10 mg  10 mg Rectal Once Juanito Doom, MD       chlorhexidine (PERIDEX) 0.12 % solution 15 mL  15 mL Mouth Rinse BID Simonne Maffucci B, MD   15  mL at 09/25/21 1038   Chlorhexidine Gluconate Cloth 2 % PADS 6 each  6 each Topical Q0600 Juanito Doom, MD   6 each at 09/25/21 0816   ciprofloxacin (CILOXAN) 0.3 % ophthalmic solution 2 drop  2 drop Both Eyes Q4H while awake Simonne Maffucci B, MD   2 drop at 09/25/21 1427   cloNIDine (CATAPRES - Dosed in mg/24 hr) patch 0.3 mg  0.3 mg Transdermal Weekly Simonne Maffucci B, MD   0.3 mg at 09/23/21 1312   dextrose 5 %-0.9 % sodium chloride infusion   Intravenous Continuous Simonne Maffucci B, MD 30 mL/hr at 09/25/21 1318 Infusion Verify at 09/25/21 1318   ferrous sulfate tablet 325 mg  325 mg Oral Q breakfast Dimple Nanas, RPH   325 mg at 09/25/21 0816   finasteride (PROSCAR) tablet 5 mg  5 mg Oral Daily Simonne Maffucci B, MD   5 mg at 09/25/21 1036   gabapentin (NEURONTIN) capsule 300 mg  300 mg Oral TID Dimple Nanas, RPH   300 mg at 09/25/21 1505   heparin ADULT infusion 100 units/mL (25000 units/288mL)  1,900 Units/hr Intravenous Continuous Poindexter, Leann T, RPH 19 mL/hr at 09/25/21 1318 1,900 Units/hr at 09/25/21 1318   hydrALAZINE (APRESOLINE) injection 10-40 mg  10-40 mg Intravenous Q6H PRN Simonne Maffucci B, MD       hydrALAZINE (APRESOLINE) tablet 100 mg  100 mg Oral Q8H McQuaid, Nathaneil Canary B, MD   100 mg at 09/25/21 1428   HYDROmorphone (DILAUDID) injection 1 mg  1 mg Intravenous Q4H PRN Simonne Maffucci B, MD   1 mg at 09/25/21 1506   insulin aspart (novoLOG) injection 0-9 Units  0-9 Units Subcutaneous TID WC Simonne Maffucci B, MD   1 Units at 09/25/21 1159   labetalol (NORMODYNE) injection 20 mg  20 mg Intravenous Q10 min PRN Simonne Maffucci B, MD   20 mg at 09/25/21 0841   lactulose (CHRONULAC) 10 GM/15ML solution 10 g  10 g Oral BID Dimple Nanas, RPH   10 g at 09/25/21 1036   levothyroxine (SYNTHROID) tablet 50 mcg  50 mcg Per Tube Q0600 Simonne Maffucci B, MD   50 mcg at 09/25/21 0509   MEDLINE mouth rinse  15 mL Mouth Rinse q12n4p Simonne Maffucci B, MD    15 mL at 09/25/21 1506   methocarbamol (ROBAXIN) 500 mg in dextrose 5 % 50 mL IVPB  500 mg Intravenous Q8H PRN Juanito Doom, MD   Stopped at 09/24/21 1459   methocarbamol (ROBAXIN) tablet 750 mg  750 mg Oral TID Simonne Maffucci B, MD   750 mg at 09/25/21 1505   metoprolol tartrate (LOPRESSOR) tablet 50 mg  50 mg Oral BID Simonne Maffucci B, MD   50 mg at 09/25/21 1038   mupirocin ointment (BACTROBAN) 2 % 1 application  1 application Nasal BID Juanito Doom, MD   1 application at 16/10/96 1035   naloxegol oxalate (  MOVANTIK) tablet 12.5 mg  12.5 mg Per NG tube Daily Simonne Maffucci B, MD       ondansetron (ZOFRAN) injection 4 mg  4 mg Intravenous Q6H PRN Simonne Maffucci B, MD   4 mg at 09/25/21 0837   oxyCODONE (OXYCONTIN) 12 hr tablet 30 mg  30 mg Oral Q12H McQuaid, Nathaneil Canary B, MD   30 mg at 09/24/21 2300   pantoprazole (PROTONIX) injection 40 mg  40 mg Intravenous Q12H Simonne Maffucci B, MD   40 mg at 09/25/21 0929   promethazine (PHENERGAN) 12.5 mg in sodium chloride 0.9 % 50 mL IVPB  12.5 mg Intravenous Q6H PRN Juanito Doom, MD   Stopped at 09/25/21 1036   saccharomyces boulardii (FLORASTOR) capsule 250 mg  250 mg Oral BID Dimple Nanas, RPH   250 mg at 09/25/21 1036   senna-docusate (Senokot-S) tablet 1 tablet  1 tablet Oral BID Dimple Nanas, RPH   1 tablet at 09/25/21 1036   simethicone (MYLICON) chewable tablet 80 mg  80 mg Oral QID Dimple Nanas, RPH   80 mg at 09/25/21 1428   tamsulosin (FLOMAX) capsule 0.4 mg  0.4 mg Oral Daily Simonne Maffucci B, MD   0.4 mg at 09/25/21 1037   vitamin B-12 (CYANOCOBALAMIN) tablet 200 mcg  200 mcg Oral Daily Dimple Nanas, RPH   200 mcg at 09/25/21 1038     Discharge Medications: Please see discharge summary for a list of discharge medications.  Relevant Imaging Results:  Relevant Lab Results:   Additional Information SSN: [id number]. Patient has right BKA and a prosthesis.  Trish Mage,  LCSW

## 2021-09-25 NOTE — Progress Notes (Signed)
[place] for lovenox --> Eliquis > Heparin gtt Indication: DVT  Patient Measurements: Height: 6\' 6"  (198.1 cm) Weight: 118.6 kg (261 lb 7.5 oz) IBW/kg (Calculated) : 91.4 Heparin Dosing Weight: 116 kg  Vital Signs: Temp: 98.5 F (36.9 C) (12/07 0300) Temp Source: Oral (12/07 0300) BP: 162/89 (12/07 0400) Pulse Rate: 85 (12/07 0400)  Labs: Recent Labs    09/22/21 0957 09/22/21 0957 09/22/21 2007 09/23/21 0952 09/23/21 1920 09/24/21 0303 09/25/21 0321  HGB 10.3*  --   --  11.7*  --  10.1* 9.9*  HCT 31.6*  --   --  36.8*  --  32.7* 33.0*  PLT 266  --   --  339  --  260 258  APTT  --   --  58*  --  39*  --   --   HEPARINUNFRC  --    < > >1.10*  --  0.47 0.59 0.75*  CREATININE 2.43*  --   --  2.54*  --   --  2.75*   < > = values in this interval not displayed.    Estimated Creatinine Clearance: 37.2 mL/min (A) (by C-G formula based on SCr of 2.75 mg/dL (H)).  Assessment: Patient's a 52 M presented to the ED on 11/28 with c/o LE swelling and pain. LE doppler on 11/29 showed age indeterminate DVT of L femoral vein. Pt was initially anticoagulated with LMWH; transitioned to apixaban; now on heparin drip per pharmacy dosing due to inability to take PO mediations and need for EGD.   Today, 09/25/21 Heparin level supratherapeutic at 0.75 with heparin gtt @ 2000 units/hr  CBC: Hgb stable (9.9) , Plt WNL No complications of therapy noted   Plan:  Decrease heparin to 1900 units/hr Check HL 8 hr after heparin rate decrease Follow daily HL and CBC Monitor for signs of bleeding   Leone Haven, PharmD 09/25/2021 4:41 AM

## 2021-09-25 NOTE — Progress Notes (Signed)
[place] for lovenox --> Eliquis > Heparin gtt Indication: DVT  Patient Measurements: Height: 6\' 6"  (198.1 cm) Weight: 118.6 kg (261 lb 7.5 oz) IBW/kg (Calculated) : 91.4 Heparin Dosing Weight: 116 kg  Vital Signs: Temp: 98.2 F (36.8 C) (12/07 1200) Temp Source: Oral (12/07 1200) BP: 124/62 (12/07 1300) Pulse Rate: 79 (12/07 1300)  Labs: Recent Labs    09/22/21 2007 09/22/21 2007 09/23/21 0952 09/23/21 1920 09/24/21 0303 09/25/21 0321  HGB  --    < > 11.7*  --  10.1* 9.9*  HCT  --   --  36.8*  --  32.7* 33.0*  PLT  --   --  339  --  260 258  APTT 58*  --   --  39*  --   --   HEPARINUNFRC >1.10*  --   --  0.47 0.59 0.75*  CREATININE  --   --  2.54*  --   --  2.75*   < > = values in this interval not displayed.    Estimated Creatinine Clearance: 37.2 mL/min (A) (by C-G formula based on SCr of 2.75 mg/dL (H)).  Assessment: Patient's a 68 M presented to the ED on 11/28 with c/o LE swelling and pain. LE doppler on 11/29 showed age indeterminate DVT of L femoral vein. Pt was initially anticoagulated with LMWH; transitioned to apixaban; now on heparin drip per pharmacy dosing due to inability to take PO mediations and need for EGD.   EGD procedure scheduled for 12/5 cancelled d/t pt's clinical status.  He was subsequently transferred to the ICU and is now intubated.  - 12/5 head CT: Motion limited study with new hypoattenuation in the left occipital lobe. While this could represent artifact, acute infarct is not excluded.  Today, 09/25/2021: - Heparin level is therapeutic at 0.62 with heparin drip adjusted to 1900 units/hr this morning - Hgb down slightly 9.9, plts ok - no bleeding documented - scr trending up   Plan:  - Continue heparin at 1900 units/hr -  daily heparin level and CBC - Monitor for s/sx of bleeding  Dia Sitter, PharmD, BCPS 09/25/2021 2:06 PM

## 2021-09-26 DIAGNOSIS — N400 Enlarged prostate without lower urinary tract symptoms: Secondary | ICD-10-CM | POA: Diagnosis not present

## 2021-09-26 DIAGNOSIS — G894 Chronic pain syndrome: Secondary | ICD-10-CM | POA: Diagnosis not present

## 2021-09-26 DIAGNOSIS — N179 Acute kidney failure, unspecified: Secondary | ICD-10-CM | POA: Diagnosis not present

## 2021-09-26 DIAGNOSIS — J189 Pneumonia, unspecified organism: Secondary | ICD-10-CM | POA: Diagnosis not present

## 2021-09-26 LAB — CBC WITH DIFFERENTIAL/PLATELET
Abs Immature Granulocytes: 0.07 10*3/uL (ref 0.00–0.07)
Basophils Absolute: 0 10*3/uL (ref 0.0–0.1)
Basophils Relative: 1 %
Eosinophils Absolute: 0.3 10*3/uL (ref 0.0–0.5)
Eosinophils Relative: 6 %
HCT: 31.9 % — ABNORMAL LOW (ref 39.0–52.0)
Hemoglobin: 9.8 g/dL — ABNORMAL LOW (ref 13.0–17.0)
Immature Granulocytes: 1 %
Lymphocytes Relative: 20 %
Lymphs Abs: 1.3 10*3/uL (ref 0.7–4.0)
MCH: 31.6 pg (ref 26.0–34.0)
MCHC: 30.7 g/dL (ref 30.0–36.0)
MCV: 102.9 fL — ABNORMAL HIGH (ref 80.0–100.0)
Monocytes Absolute: 0.6 10*3/uL (ref 0.1–1.0)
Monocytes Relative: 9 %
Neutro Abs: 3.9 10*3/uL (ref 1.7–7.7)
Neutrophils Relative %: 63 %
Platelets: 256 10*3/uL (ref 150–400)
RBC: 3.1 MIL/uL — ABNORMAL LOW (ref 4.22–5.81)
RDW: 15.4 % (ref 11.5–15.5)
WBC: 6.2 10*3/uL (ref 4.0–10.5)
nRBC: 0 % (ref 0.0–0.2)

## 2021-09-26 LAB — BASIC METABOLIC PANEL
Anion gap: 9 (ref 5–15)
BUN: 36 mg/dL — ABNORMAL HIGH (ref 8–23)
CO2: 22 mmol/L (ref 22–32)
Calcium: 7.1 mg/dL — ABNORMAL LOW (ref 8.9–10.3)
Chloride: 110 mmol/L (ref 98–111)
Creatinine, Ser: 3.08 mg/dL — ABNORMAL HIGH (ref 0.61–1.24)
GFR, Estimated: 21 mL/min — ABNORMAL LOW (ref >60)
Glucose, Bld: 107 mg/dL — ABNORMAL HIGH (ref 70–99)
Potassium: 4.5 mmol/L (ref 3.5–5.1)
Sodium: 141 mmol/L (ref 135–145)

## 2021-09-26 LAB — GLUCOSE, CAPILLARY
Glucose-Capillary: 105 mg/dL — ABNORMAL HIGH (ref 70–99)
Glucose-Capillary: 107 mg/dL — ABNORMAL HIGH (ref 70–99)
Glucose-Capillary: 108 mg/dL — ABNORMAL HIGH (ref 70–99)
Glucose-Capillary: 115 mg/dL — ABNORMAL HIGH (ref 70–99)
Glucose-Capillary: 129 mg/dL — ABNORMAL HIGH (ref 70–99)
Glucose-Capillary: 97 mg/dL (ref 70–99)

## 2021-09-26 MED ORDER — SODIUM CHLORIDE 0.9 % IV SOLN: INTRAVENOUS | Status: AC

## 2021-09-26 MED ORDER — CHLORHEXIDINE GLUCONATE CLOTH 2 % EX PADS
6.0000 | MEDICATED_PAD | Freq: Every day | CUTANEOUS | Status: DC
  Administered 2021-09-26 – 2021-10-01 (×6): 6 via TOPICAL

## 2021-09-26 MED ORDER — METHOCARBAMOL 500 MG PO TABS: 750.0000 mg | ORAL_TABLET | Freq: Three times a day (TID) | ORAL | Status: DC | PRN

## 2021-09-26 MED ORDER — CALCIUM GLUCONATE-NACL 1-0.675 GM/50ML-% IV SOLN
1.0000 g | Freq: Once | INTRAVENOUS | Status: AC
  Administered 2021-09-26: 1000 mg via INTRAVENOUS
  Filled 2021-09-26: qty 50

## 2021-09-26 NOTE — Plan of Care (Signed)
  Problem: Education: Goal: Knowledge of General Education information will improve Description: Including pain rating scale, medication(s)/side effects and non-pharmacologic comfort measures Outcome: Progressing   Problem: Health Behavior/Discharge Planning: Goal: Ability to manage health-related needs will improve Outcome: Progressing   Problem: Clinical Measurements: Goal: Ability to maintain clinical measurements within normal limits will improve Outcome: Progressing Goal: Will remain free from infection Outcome: Progressing Goal: Diagnostic test results will improve Outcome: Progressing Goal: Respiratory complications will improve Outcome: Progressing Goal: Cardiovascular complication will be avoided Outcome: Progressing   Problem: Activity: Goal: Risk for activity intolerance will decrease Outcome: Progressing   Problem: Nutrition: Goal: Adequate nutrition will be maintained Outcome: Progressing   Problem: Coping: Goal: Level of anxiety will decrease Outcome: Progressing   Problem: Pain Managment: Goal: General experience of comfort will improve Outcome: Progressing   Problem: Safety: Goal: Ability to remain free from injury will improve Outcome: Progressing   Problem: Skin Integrity: Goal: Risk for impaired skin integrity will decrease Outcome: Progressing   Problem: Safety: Goal: Non-violent Restraint(s) Outcome: Progressing

## 2021-09-26 NOTE — Care Management Important Message (Signed)
Important Message  Patient Details IM Letter given to the Patient. Name: Thomas [name] MRN: [id number] Date of Birth: 02/28/1951   Medicare Important Message Given:  Yes     Kerin Salen 09/26/2021, 10:47 AM

## 2021-09-26 NOTE — Progress Notes (Signed)
PROGRESS NOTE  RAEL YO YYQ:[id number] DOB: [birth year] DOA: 09/16/2021 PCP: Antonietta Jewel, MD  HPI/Recap of past 24 hours: Thomas [name] is an 70 y.o. male past medical history of DM type 2, HTN, CKD stage IV, chronic normocytic anemia, brought into the ED from home via EMS due to concern by his brother BJ not looking well, not eating or taking his medications for several days.  Upon EMS arrival, his blood glucose was 57 was given D10.  Work-up revealed AKI on CKD 4, suspected prerenal secondary to dehydration.  Also revealed left lower lobe community-acquired pneumonia, left lower extremity cellulitis and age-indeterminate left lower extremity DVT.  MRI left foot could not rule out second digit osteomyelitis. He received empiric IV antibiotics x 6 days.  MRI thoracic spine 09/17/2021 did not show evidence of discitis. Hospital course complicated by intractable nausea and vomiting for which GI was consulted.  Abdominal x-ray was unrevealing.  Planned for EGD, but canceled due to severe delirium.  PCCM consulted on 12/5 due to significant worsening of delirium and combativeness with intermittent unresponsiveness, and hypertensive crisis.  Patient was intubated on 12/5 and extubated on 12/6.  Patient was further stabilized in the ICU and [facility] hospitalist assumed care on 09/25/2021.     Today, pt noted to be sleepy, able to answer my questions but drifts back to sleep. Just received his pain meds. Denies any new complaints.    Assessment/Plan: Principal Problem:   ARF (acute renal failure) (HCC) Active Problems:   [name], right ([place])   [name], type 2) ([place])   HTN (hypertension)   BPH (benign prostatic hyperplasia)   Chronic pain syndrome   AKI (acute kidney injury) ([place])   CAP (community acquired pneumonia)   Delirium/Acute metabolic encephalopathy Noted for hypertensive emergency/PRES Resolved CT head could not rule out left occipital lobe infarct due  to motion Consider MRI if encephalopathic again Strict BP control Delirium precautions  Hypertensive emergency-resolved Hx of HTN BP stable Continue clonidine patch, hydralazine, amlodipine, metoprolol Monitor closely  Acute respiratory failure with hypoxemia due to inability to protect airway Resolved Required intubation on 12/5, extubated on 12/6 Currently on 3 L of O2, plan to wean off Monitor closely  Treated LLL CAP, POA Completed 6 days of Rocephin and 5 days of azithromycin O2 saturation 98% 3LNC Wean off O2 suppl as tolerated  Acute kidney injury on chronic kidney disease stage IV Ongoing, Cr rising  Baseline creatinine around 2.4, on admission 4.62 Continue to hold off home Lasix Started IV gentle hydration Daily BMP  Chronic systolic HF Currently appears euvolemic Echo done showed EF of 35 to 40%, left ventricle global hypokinesis, severely elevated pulmonary artery systolic pressure Holding home Lasix for now, consider restarting soon pending creatinine  Left lower extremity age-indeterminate DVT, seen on Doppler ultrasound 09/17/2021, POA Due to significant delirium and possibility of EGD, patient was started on heparin drip on 09/22/2021, discontinued  Restart Eliquis   Hypocalcemia Replace as needed   Moderate to severe R hydroureteronephrosis seen on CT scan Increased compared to prior exam, no evidence of obstructing calculus Management per urology-Foley catheter for about 1 week, and attempt voiding trial, possible stricture.  Ureteral stent not recommended Continue Flomax, finasteride   Treated left lower extremity cellulitis, POA Received 6 days of empiric IV antibiotics MRI of the left foot, osteomyelitis of second digit could not be completely ruled out, unlikely per podiatry, Dr. Posey Pronto.  Seen by podiatry Dr.  Posey Pronto, plan to follow-up outpatient.  No evidence of infection or osteomyelitis to the second digits.  No local wound care indicated.  Okay  to be discharged from podiatry standpoint.  Will need to follow-up with podiatry outpatient. MRSA screen positive 09/21/21   History of upper GI bleed with ground coffee emesis/LA grade A seen on EGD done on 11/25/2019 by Dr. Benson Norway  Continue IV Protonix 40 mg twice daily. Hemoglobin stable No overt bleeding Planned for EGD on 09/23/2021, canceled due to delirium Appreciate GIs assistance.   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  Diabetes mellitus type 2 Hemoglobin A1c 6.0 09/20/21 ISS  Anemia of chronic renal disease/iron deficiency anemia: Iron deficiency noted on iron studies. Hemoglobin stable Continue iron supplement  Chronic pain Continue pain management and bowel regimen   Physical debility PT assessed and recommended SNF Continue PT OT with assistance and fall precautions.   Obesity BMI 30 Recommend weight loss outpatient with regular physical activity and healthy dieting.   Dry eyes/left conjunctivitis, suspect bacterial Cipro eyedrops X 3 days, completed    Estimated body mass index is 30.9 kg/m as calculated from the following:   Height as of this encounter: 6\' 6"  (1.981 m).   Weight as of this encounter: 121.3 kg.     Code Status: Full  Family Communication: None at bedside  Disposition Plan: Status is: Inpatient  Remains inpatient appropriate because: Level of care    Consultants: Podiatry GI PCCM   Procedures: Mechanical ventilation on 12/5-12/6  Antimicrobials: Currently none  DVT prophylaxis: Eliquis   Objective: Vitals:   09/25/21 2100 09/25/21 2228 09/26/21 0358 09/26/21 0500  BP: 135/71 129/79 135/80   Pulse: 71 64 72   Resp: (!) 27 20 17    Temp:  98.3 F (36.8 C) 98.1 F (36.7 C)   TempSrc:      SpO2: 95% 99% 98%   Weight:    121.3 kg  Height:        Intake/Output Summary (Last 24 hours) at 09/26/2021 1252 Last data filed at  09/26/2021 0517 Gross per 24 hour  Intake 508.19 ml  Output 850 ml  Net -341.81 ml   Filed Weights   09/22/21 0420 09/24/21 0500 09/26/21 0500  Weight: 121.3 kg 118.6 kg 121.3 kg    Exam: General: NAD, sleepy Cardiovascular: S1, S2 present Respiratory: CTAB Abdomen: Soft, nontender, nondistended, bowel sounds present Musculoskeletal: No bilateral pedal edema noted, right BKA Skin: Normal Psychiatry: Normal mood    Data Reviewed: CBC: Recent Labs  Lab 09/22/21 0957 09/23/21 0952 09/24/21 0303 09/25/21 0321 09/26/21 0530  WBC 7.4 12.6* 7.9 7.7 6.2  NEUTROABS 5.8 11.1*  --  5.1 3.9  HGB 10.3* 11.7* 10.1* 9.9* 9.8*  HCT 31.6* 36.8* 32.7* 33.0* 31.9*  MCV 96.3 95.6 99.1 102.2* 102.9*  PLT 266 339 260 258 193   Basic Metabolic Panel: Recent Labs  Lab 09/20/21 0613 09/21/21 0700 09/22/21 0957 09/23/21 0952 09/23/21 1920 09/24/21 0303 09/25/21 0321 09/26/21 0530  NA 138  --  140 139  --   --  144 141  K 4.9  --  4.0 3.9  --   --  4.1 4.5  CL 109  --  109 110  --   --  113* 110  CO2 22  --  25 17*  --   --  23 22  GLUCOSE 111*  --  109* 181*  --   --  122* 107*  BUN 30*  --  24* 28*  --   --  40* 36*  CREATININE 2.35*  2.33* 2.22* 2.43* 2.54*  --   --  2.75* 3.08*  CALCIUM 7.5*  --  7.5* 7.6*  --   --  7.1* 7.1*  MG  --   --  1.4* 1.7 2.0 2.0  --   --   PHOS 3.8  --  3.3 3.8 4.4 5.3*  --   --    GFR: Estimated Creatinine Clearance: 33.6 mL/min (A) (by C-G formula based on SCr of 3.08 mg/dL (H)). Liver Function Tests: Recent Labs  Lab 09/20/21 0613 09/22/21 0957 09/23/21 0952 09/25/21 0321  AST  --  42* 49* 31  ALT  --  17 20 13   ALKPHOS  --  88 94 73  BILITOT  --  0.6 1.1 0.7  PROT  --  7.1 7.9 7.0  ALBUMIN 2.8* 3.0* 3.3* 2.9*   Recent Labs  Lab 09/22/21 0957  LIPASE 32   Recent Labs  Lab 09/23/21 1542  AMMONIA 22   Coagulation Profile: No results for input(s): INR, PROTIME in the last 168 hours. Cardiac Enzymes: No results for input(s):  CKTOTAL, CKMB, CKMBINDEX, TROPONINI in the last 168 hours. BNP (last 3 results) No results for input(s): PROBNP in the last 8760 hours. HbA1C: No results for input(s): HGBA1C in the last 72 hours. CBG: Recent Labs  Lab 09/25/21 1938 09/25/21 2329 09/26/21 0401 09/26/21 0826 09/26/21 1137  GLUCAP 108* 116* 105* 129* 108*   Lipid Profile: No results for input(s): CHOL, HDL, LDLCALC, TRIG, CHOLHDL, LDLDIRECT in the last 72 hours. Thyroid Function Tests: Recent Labs    09/23/21 1542  TSH 1.444   Anemia Panel: No results for input(s): VITAMINB12, FOLATE, FERRITIN, TIBC, IRON, RETICCTPCT in the last 72 hours. Urine analysis:    Component Value Date/Time   COLORURINE YELLOW 09/17/2021 0808   APPEARANCEUR HAZY (A) 09/17/2021 0808   LABSPEC 1.011 09/17/2021 0808   PHURINE 5.0 09/17/2021 0808   GLUCOSEU NEGATIVE 09/17/2021 0808   HGBUR NEGATIVE 09/17/2021 0808   BILIRUBINUR NEGATIVE 09/17/2021 0808   KETONESUR NEGATIVE 09/17/2021 0808   PROTEINUR 100 (A) 09/17/2021 0808   NITRITE NEGATIVE 09/17/2021 0808   LEUKOCYTESUR LARGE (A) 09/17/2021 0808   Sepsis Labs: @LABRCNTIP (procalcitonin:4,lacticidven:4)  ) Recent Results (from the past 240 hour(s))  Blood culture (routine x 2)     Status: None   Collection Time: 09/16/21 10:45 PM   Specimen: BLOOD  Result Value Ref Range Status   Specimen Description   Final    BLOOD BLOOD RIGHT ARM Performed at [facility], [place] [address], [place], [place] [address]    Special Requests   Final    BOTTLES DRAWN AEROBIC AND ANAEROBIC Blood Culture adequate volume Performed at [place] [address] [place], [place] [address]    Culture   Final    NO GROWTH 5 DAYS Performed at [facility], [place] [address], [place], [place] [address]    Report Status 09/22/2021 FINAL  Final  Resp Panel by RT-PCR (Flu A&B, Covid)     Status: None   Collection Time: 09/16/21 10:45 PM   Specimen:  Nasopharyngeal(NP) swabs in vial transport medium  Result Value Ref Range Status   SARS Coronavirus 2 by RT PCR NEGATIVE NEGATIVE Final    Comment: (NOTE) SARS-CoV-2 target nucleic acids are NOT DETECTED.  The SARS-CoV-2 RNA is generally detectable in upper  respiratory specimens during the acute phase of infection. The lowest concentration of SARS-CoV-2 viral copies this assay can detect is 138 copies/mL. A negative result does not preclude SARS-Cov-2 infection and should not be used as the sole basis for treatment or other patient management decisions. A negative result may occur with  improper specimen collection/handling, submission of specimen other than nasopharyngeal swab, presence of viral mutation(s) within the areas targeted by this assay, and inadequate number of viral copies(<138 copies/mL). A negative result must be combined with clinical observations, patient history, and epidemiological information. The expected result is Negative.  Fact Sheet for Patients:  EntrepreneurPulse.com.au  Fact Sheet for Healthcare Providers:  IncredibleEmployment.be  This test is no t yet approved or cleared by the [place] FDA and  has been authorized for detection and/or diagnosis of SARS-CoV-2 by FDA under an Emergency Use Authorization (EUA). This EUA will remain  in effect (meaning this test can be used) for the duration of the COVID-19 declaration under Section 564(b)(1) of the Act, 21 U.S.C.section 360bbb-3(b)(1), unless the authorization is terminated  or revoked sooner.       Influenza A by PCR NEGATIVE NEGATIVE Final   Influenza B by PCR NEGATIVE NEGATIVE Final    Comment: (NOTE) The Xpert Xpress SARS-CoV-2/FLU/RSV plus assay is intended as an aid in the diagnosis of influenza from Nasopharyngeal swab specimens and should not be used as a sole basis for treatment. Nasal washings and aspirates are unacceptable for Xpert Xpress  SARS-CoV-2/FLU/RSV testing.  Fact Sheet for Patients: EntrepreneurPulse.com.au  Fact Sheet for Healthcare Providers: IncredibleEmployment.be  This test is not yet approved or cleared by the [place] FDA and has been authorized for detection and/or diagnosis of SARS-CoV-2 by FDA under an Emergency Use Authorization (EUA). This EUA will remain in effect (meaning this test can be used) for the duration of the COVID-19 declaration under Section 564(b)(1) of the Act, 21 U.S.C. section 360bbb-3(b)(1), unless the authorization is terminated or revoked.  Performed at [facility], [place] [address], [place], [place] [address]   Urine Culture     Status: None   Collection Time: 09/18/21 12:25 PM   Specimen: Urine, Clean Catch  Result Value Ref Range Status   Specimen Description   Final    URINE, CLEAN CATCH Performed at [facility], [place] [address], [place], [place] [address]    Special Requests   Final    NONE Performed at [facility], [place] [address], [place], [place] [address]    Culture   Final    NO GROWTH Performed at [facility], [place] [address] [place], [place] [address]    Report Status 09/19/2021 FINAL  Final  MRSA Next Gen by PCR, Nasal     Status: Abnormal   Collection Time: 09/21/21  6:16 AM   Specimen: Nasal Mucosa; Nasal Swab  Result Value Ref Range Status   MRSA by PCR Next Gen DETECTED (A) NOT DETECTED Final    Comment: RESULT CALLED TO, READ BACK BY AND VERIFIED WITH: HUGHUY, S. RN ON 09/21/2021 @ 0941 BY MECIAL J. (NOTE) The GeneXpert MRSA Assay (FDA approved for NASAL specimens only), is one component of a comprehensive MRSA colonization surveillance program. It is not intended to diagnose MRSA infection nor to guide or monitor treatment for MRSA infections. Test performance is not FDA approved in patients less than 37 years old. Performed at [facility], [place] [address], [place], [place] [address]  Studies: No results found.  Scheduled Meds:  allopurinol  100 mg Oral Daily   amLODipine  5 mg Oral Daily   apixaban  5 mg Oral BID   atorvastatin  80 mg Oral Daily   chlorhexidine  15 mL Mouth Rinse BID   cloNIDine  0.3 mg Transdermal Weekly   ferrous sulfate  325 mg Oral Q breakfast   finasteride  5 mg Oral Daily   gabapentin  300 mg Oral TID   hydrALAZINE  100 mg Oral Q8H   insulin aspart  0-9 Units Subcutaneous TID WC   lactulose  10 g Oral BID   levothyroxine  50 mcg Per Tube Q0600   mouth rinse  15 mL Mouth Rinse q12n4p   methocarbamol  750 mg Oral TID   metoprolol tartrate  50 mg Oral BID   naloxegol oxalate  12.5 mg Per NG tube Daily   oxyCODONE  30 mg Oral Q12H   pantoprazole (PROTONIX) IV  40 mg Intravenous Q12H   saccharomyces boulardii  250 mg Oral BID   senna-docusate  1 tablet Oral BID   simethicone  80 mg Oral QID   tamsulosin  0.4 mg Oral Daily   vitamin B-12  200 mcg Oral Daily    Continuous Infusions:  sodium chloride 75 mL/hr at 09/26/21 1142   methocarbamol (ROBAXIN) IV Stopped (09/24/21 1459)   promethazine (PHENERGAN) injection (IM or IVPB) Stopped (09/25/21 1036)     LOS: 9 days     Alma Friendly, MD [facility]  If 7PM-7AM, please contact night-coverage [email or web address] 09/26/2021, 12:52 PM

## 2021-09-27 DIAGNOSIS — R531 Weakness: Secondary | ICD-10-CM

## 2021-09-27 DIAGNOSIS — Z515 Encounter for palliative care: Secondary | ICD-10-CM

## 2021-09-27 LAB — CBC WITH DIFFERENTIAL/PLATELET
Abs Immature Granulocytes: 0.06 10*3/uL (ref 0.00–0.07)
Basophils Absolute: 0 10*3/uL (ref 0.0–0.1)
Basophils Relative: 1 %
Eosinophils Absolute: 0.3 10*3/uL (ref 0.0–0.5)
Eosinophils Relative: 4 %
HCT: 29.4 % — ABNORMAL LOW (ref 39.0–52.0)
Hemoglobin: 8.9 g/dL — ABNORMAL LOW (ref 13.0–17.0)
Immature Granulocytes: 1 %
Lymphocytes Relative: 21 %
Lymphs Abs: 1.5 10*3/uL (ref 0.7–4.0)
MCH: 30.9 pg (ref 26.0–34.0)
MCHC: 30.3 g/dL (ref 30.0–36.0)
MCV: 102.1 fL — ABNORMAL HIGH (ref 80.0–100.0)
Monocytes Absolute: 0.7 10*3/uL (ref 0.1–1.0)
Monocytes Relative: 9 %
Neutro Abs: 4.6 10*3/uL (ref 1.7–7.7)
Neutrophils Relative %: 64 %
Platelets: 243 10*3/uL (ref 150–400)
RBC: 2.88 MIL/uL — ABNORMAL LOW (ref 4.22–5.81)
RDW: 15.2 % (ref 11.5–15.5)
WBC: 7.1 10*3/uL (ref 4.0–10.5)
nRBC: 0 % (ref 0.0–0.2)

## 2021-09-27 LAB — BASIC METABOLIC PANEL
Anion gap: 6 (ref 5–15)
BUN: 34 mg/dL — ABNORMAL HIGH (ref 8–23)
CO2: 23 mmol/L (ref 22–32)
Calcium: 7.2 mg/dL — ABNORMAL LOW (ref 8.9–10.3)
Chloride: 110 mmol/L (ref 98–111)
Creatinine, Ser: 3.07 mg/dL — ABNORMAL HIGH (ref 0.61–1.24)
GFR, Estimated: 21 mL/min — ABNORMAL LOW (ref >60)
Glucose, Bld: 99 mg/dL (ref 70–99)
Potassium: 4.1 mmol/L (ref 3.5–5.1)
Sodium: 139 mmol/L (ref 135–145)

## 2021-09-27 LAB — GLUCOSE, CAPILLARY
Glucose-Capillary: 98 mg/dL (ref 70–99)
Glucose-Capillary: 94 mg/dL (ref 70–99)
Glucose-Capillary: 117 mg/dL — ABNORMAL HIGH (ref 70–99)
Glucose-Capillary: 133 mg/dL — ABNORMAL HIGH (ref 70–99)
Glucose-Capillary: 166 mg/dL — ABNORMAL HIGH (ref 70–99)

## 2021-09-27 MED ORDER — NALOXEGOL OXALATE 12.5 MG PO TABS
12.5000 mg | ORAL_TABLET | Freq: Every day | ORAL | Status: DC
  Administered 2021-09-28 – 2021-10-01 (×4): 12.5 mg via ORAL
  Filled 2021-09-27 (×4): qty 1

## 2021-09-27 MED ORDER — OXYCODONE HCL ER 20 MG PO T12A
30.0000 mg | EXTENDED_RELEASE_TABLET | Freq: Three times a day (TID) | ORAL | Status: DC
  Administered 2021-09-27 – 2021-10-01 (×13): 30 mg via ORAL
  Filled 2021-09-27 (×13): qty 1

## 2021-09-27 MED ORDER — CALCIUM GLUCONATE-NACL 1-0.675 GM/50ML-% IV SOLN
1.0000 g | Freq: Once | INTRAVENOUS | Status: AC
  Administered 2021-09-27: 1000 mg via INTRAVENOUS
  Filled 2021-09-27: qty 50

## 2021-09-27 MED ORDER — OXYCODONE HCL ER 20 MG PO T12A: 30.0000 mg | EXTENDED_RELEASE_TABLET | Freq: Three times a day (TID) | ORAL | Status: DC

## 2021-09-27 NOTE — TOC Progression Note (Addendum)
Transition of Care [facility]) - Progression Note    Patient Details  Name: QUANTA ROHER MRN: [id number] Date of Birth: March 29, 1951  Transition of Care [facility]) CM/SW [place], [place] Phone Number: 09/27/2021, 8:43 AM  Clinical Narrative:  Patient has bed offer at [facility] in [place] for Monday. TOC will continue to follow during the course of hospitalization.  Addendum: Spoke with patient about possible d/c Monday.  He states that he knows he has not had a 60 day reset, and as such will not be able to go to SNF without a copay.  Consequently, he plans to return home. I let him know I would contact Kim at [facility] to get confirmation.  Perhaps he is mistaken.  Messaged Kim, and her business office is checking his status. TOC will continue to follow during the course of hospitalization.       Expected Discharge Plan: Skilled Nursing Facility Barriers to Discharge: Barriers Resolved  Expected Discharge Plan and Services Expected Discharge Plan: [place]   Discharge Planning Services: CM Consult Post Acute Care Choice: [place] arrangements for the past 2 months: Apartment Expected Discharge Date: 09/21/21                         HH Arranged: RN, PT, OT, Nurse's Aide, Social Work [employer] Agency: [place] Date [place]: 09/20/21 Time [place]: [place] Representative spoke with at [place]: [place] ([place]) Interventions    Readmission Risk Interventions Readmission Risk Prevention Plan 09/20/2021 09/17/2019  Transportation Screening Complete Complete  PCP or Specialist Appt within 5-7 Days - Not Complete  Not Complete comments - snf vs home  PCP or Specialist Appt within 3-5 Days Complete -  Home Care Screening - Complete  Medication Review (RN CM) - Complete  HRI or Home Care Consult Complete -  Social Work Consult for [place] Planning/Counseling Complete -  Palliative  Care Screening Not Applicable -  Medication Review Press photographer) Complete -  Some recent data might be hidden

## 2021-09-27 NOTE — Progress Notes (Signed)
PROGRESS NOTE  Thomas [name] VXB:[id number] DOB: [birth year] DOA: 09/16/2021 PCP: Antonietta Jewel, MD  HPI/Recap of past 24 hours: Thomas [name] is an 70 y.o. male past medical history of DM type 2, HTN, CKD stage IV, chronic normocytic anemia, brought into the ED from home via EMS due to concern by his brother Thomas [name] not looking well, not eating or taking his medications for several days.  Upon EMS arrival, his blood glucose was 57 was given D10.  Work-up revealed AKI on CKD 4, suspected prerenal secondary to dehydration.  Also revealed left lower lobe community-acquired pneumonia, left lower extremity cellulitis and age-indeterminate left lower extremity DVT.  MRI left foot could not rule out second digit osteomyelitis. He received empiric IV antibiotics x 68 days.  MRI thoracic spine 09/17/2021 did not show evidence of discitis. Hospital course complicated by intractable nausea and vomiting for which GI was consulted.  Abdominal x-ray was unrevealing.  Planned for EGD, but canceled due to severe delirium.  PCCM consulted on 12/5 due to significant worsening of delirium and combativeness with intermittent unresponsiveness, and hypertensive crisis.  Patient was intubated on 12/5 and extubated on 12/6.  Patient was further stabilized in the ICU and [facility] hospital[facility] assumed care on 09/25/2021.     Today, pt reported some pain in his LLE. Denies any new complaints, more awake, alert and oriented.    Assessment/Plan: Principal Problem:   ARF (acute renal failure) (HCC) Active Problems:   [name], right ([place])   [name], type 2) ([place])   HTN (hypertension)   BPH (benign prostatic hyperplasia)   Chronic pain syndrome   AKI (acute kidney injury) ([place])   CAP (community acquired pneumonia)   Delirium/Acute metabolic encephalopathy Noted for hypertensive emergency/PRES Resolved CT head could not rule out left occipital lobe infarct due to motion Consider MRI if  encephalopathic again Strict BP control Delirium precautions  Hypertensive emergency-resolved Hx of HTN BP stable Continue clonidine patch, hydralazine, amlodipine, metoprolol Monitor closely  Acute respiratory failure with hypoxemia due to inability to protect airway Resolved Required intubation on 12/5, extubated on 12/6 Weaned to RA Monitor closely  LLL CAP, POA Completed 6 days of Rocephin and 5 days of azithromycin  Acute kidney injury on chronic kidney disease stage IV Ongoing Baseline creatinine around 2.4, on admission 4.62 Continue to hold off home Lasix S/p IV gentle hydration Daily BMP  Chronic systolic HF Currently appears euvolemic Echo done showed EF of 35 to 40%, left ventricle global hypokinesis, severely elevated pulmonary artery systolic pressure Holding home Lasix for now, consider restarting soon pending creatinine  Left lower extremity age-indeterminate DVT, seen on Doppler ultrasound 09/17/2021, POA Due to significant delirium and possibility of EGD, patient was started on heparin drip on 09/22/2021, discontinued  Continue Eliquis   Hypocalcemia Replace as needed   Moderate to severe R hydroureteronephrosis seen on CT scan Increased compared to prior exam, no evidence of obstructing calculus Management per urology-Foley catheter for about 1 week, and attempt voiding trial, possible stricture.  Ureteral stent not recommended Continue Flomax, finasteride   Treated left lower extremity cellulitis, POA Received 6 days of empiric IV antibiotics MRI of the left foot, osteomyelitis of second digit could not be completely ruled out, unlikely per podiatry, Dr. Posey Pronto.  Seen by podiatry Dr. Posey Pronto, plan to follow-up outpatient.  No evidence of infection or osteomyelitis to the second digits.  No local wound care indicated.  Okay to be discharged from podiatry  standpoint.  Will need to follow-up with podiatry outpatient. MRSA screen positive 09/21/21   History  of upper GI bleed with ground coffee emesis/LA grade A seen on EGD done on 11/25/2019 by Dr. Benson Norway  Continue IV Protonix 40 mg twice daily. Hemoglobin stable No overt bleeding Planned for EGD on 09/23/2021, canceled due to delirium Appreciate GIs assistance.   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  Diabetes mellitus type 2 Hemoglobin A1c 6.0 09/20/21 ISS  Anemia of chronic renal disease/iron deficiency anemia: Iron deficiency noted on iron studies. Hemoglobin stable Continue iron supplement  Chronic pain Continue pain management and bowel regimen   Physical debility PT assessed and recommended SNF Continue PT OT with assistance and fall precautions.   Obesity BMI 30 Recommend weight loss outpatient with regular physical activity and healthy dieting.   Dry eyes/left conjunctivitis, suspect bacterial Cipro eyedrops X 3 days, completed    Estimated body mass index is 31.36 kg/m as calculated from the following:   Height as of this encounter: 6\' 6"  (1.981 m).   Weight as of this encounter: 123.1 kg.     Code Status: Full  Family Communication: None at bedside  Disposition Plan: Status is: Inpatient  Remains inpatient appropriate because: Level of care    Consultants: Podiatry GI PCCM   Procedures: Mechanical ventilation on 12/5-12/6  Antimicrobials: Currently none  DVT prophylaxis: Eliquis   Objective: Vitals:   09/26/21 1343 09/26/21 2026 09/27/21 0417 09/27/21 1321  BP: (!) 144/80 139/68 (!) 154/88 (!) 146/82  Pulse: 94 69 66 74  Resp: 16 18 20 19   Temp: 98.4 F (36.9 C) 97.9 F (36.6 C) 98.5 F (36.9 C) 99.6 F (37.6 C)  TempSrc: Oral Oral  Oral  SpO2: 94% 90% 99% 99%  Weight:   123.1 kg   Height:        Intake/Output Summary (Last 24 hours) at 09/27/2021 1734 Last data filed at 09/27/2021 1713 Gross per 24 hour  Intake 983.63 ml  Output 3000  ml  Net -2016.37 ml   Filed Weights   09/24/21 0500 09/26/21 0500 09/27/21 0417  Weight: 118.6 kg 121.3 kg 123.1 kg    Exam: General: NAD Cardiovascular: S1, S2 present Respiratory: CTAB Abdomen: Soft, nontender, nondistended, bowel sounds present Musculoskeletal: No bilateral pedal edema noted, right BKA Skin: Normal Psychiatry: Normal mood    Data Reviewed: CBC: Recent Labs  Lab 09/22/21 0957 09/23/21 0952 09/24/21 0303 09/25/21 0321 09/26/21 0530 09/27/21 0611  WBC 7.4 12.6* 7.9 7.7 6.2 7.1  NEUTROABS 5.8 11.1*  --  5.1 3.9 4.6  HGB 10.3* 11.7* 10.1* 9.9* 9.8* 8.9*  HCT 31.6* 36.8* 32.7* 33.0* 31.9* 29.4*  MCV 96.3 95.6 99.1 102.2* 102.9* 102.1*  PLT 266 339 260 258 256 767   Basic Metabolic Panel: Recent Labs  Lab 09/22/21 0957 09/23/21 0952 09/23/21 1920 09/24/21 0303 09/25/21 0321 09/26/21 0530 09/27/21 0611  NA 140 139  --   --  144 141 139  K 4.0 3.9  --   --  4.1 4.5 4.1  CL 109 110  --   --  113* 110 110  CO2 25 17*  --   --  23 22 23   GLUCOSE 109* 181*  --   --  122* 107* 99  BUN 24* 28*  --   --  40* 36* 34*  CREATININE 2.43* 2.54*  --   --  2.75*  3.08* 3.07*  CALCIUM 7.5* 7.6*  --   --  7.1* 7.1* 7.2*  MG 1.4* 1.7 2.0 2.0  --   --   --   PHOS 3.3 3.8 4.4 5.3*  --   --   --    GFR: Estimated Creatinine Clearance: 33.9 mL/min (A) (by C-G formula based on SCr of 3.07 mg/dL (H)). Liver Function Tests: Recent Labs  Lab 09/22/21 0957 09/23/21 0952 09/25/21 0321  AST 42* 49* 31  ALT 17 20 13   ALKPHOS 88 94 73  BILITOT 0.6 1.1 0.7  PROT 7.1 7.9 7.0  ALBUMIN 3.0* 3.3* 2.9*   Recent Labs  Lab 09/22/21 0957  LIPASE 32   Recent Labs  Lab 09/23/21 1542  AMMONIA 22   Coagulation Profile: No results for input(s): INR, PROTIME in the last 168 hours. Cardiac Enzymes: No results for input(s): CKTOTAL, CKMB, CKMBINDEX, TROPONINI in the last 168 hours. BNP (last 3 results) No results for input(s): PROBNP in the last 8760  hours. HbA1C: No results for input(s): HGBA1C in the last 72 hours. CBG: Recent Labs  Lab 09/26/21 2340 09/27/21 0412 09/27/21 0754 09/27/21 1134 09/27/21 1627  GLUCAP 97 98 94 117* 133*   Lipid Profile: No results for input(s): CHOL, HDL, LDLCALC, TRIG, CHOLHDL, LDLDIRECT in the last 72 hours. Thyroid Function Tests: No results for input(s): TSH, T4TOTAL, FREET4, T3FREE, THYROIDAB in the last 72 hours.  Anemia Panel: No results for input(s): VITAMINB12, FOLATE, FERRITIN, TIBC, IRON, RETICCTPCT in the last 72 hours. Urine analysis:    Component Value Date/Time   COLORURINE YELLOW 09/17/2021 0808   APPEARANCEUR HAZY (A) 09/17/2021 0808   LABSPEC 1.011 09/17/2021 0808   PHURINE 5.0 09/17/2021 0808   GLUCOSEU NEGATIVE 09/17/2021 0808   HGBUR NEGATIVE 09/17/2021 0808   BILIRUBINUR NEGATIVE 09/17/2021 0808   KETONESUR NEGATIVE 09/17/2021 0808   PROTEINUR 100 (A) 09/17/2021 0808   NITRITE NEGATIVE 09/17/2021 0808   LEUKOCYTESUR LARGE (A) 09/17/2021 0808   Sepsis Labs: @LABRCNTIP (procalcitonin:4,lacticidven:4)  ) Recent Results (from the past 240 hour(s))  Urine Culture     Status: None   Collection Time: 09/18/21 12:25 PM   Specimen: Urine, Clean Catch  Result Value Ref Range Status   Specimen Description   Final    URINE, CLEAN CATCH Performed at [facility], [place] [address], [place], [place] [address]    Special Requests   Final    NONE Performed at [facility], [place] [address] [place], [place] [address]    Culture   Final    NO GROWTH Performed at [facility], [place] [address] [place], [place] [address]    Report Status 09/19/2021 FINAL  Final  MRSA Next Gen by PCR, Nasal     Status: Abnormal   Collection Time: 09/21/21  6:16 AM   Specimen: Nasal Mucosa; Nasal Swab  Result Value Ref Range Status   MRSA by PCR Next Gen DETECTED (A) NOT DETECTED Final    Comment: RESULT CALLED TO, READ BACK BY AND VERIFIED  WITH: HUGHUY, S. RN ON 09/21/2021 @ 0941 BY MECIAL J. (NOTE) The GeneXpert MRSA Assay (FDA approved for NASAL specimens only), is one component of a comprehensive MRSA colonization surveillance program. It is not intended to diagnose MRSA infection nor to guide or monitor treatment for MRSA infections. Test performance is not FDA approved in patients less than 107 years old. Performed at [facility], [place] [address] [place],  [address]  Studies: No results found.  Scheduled Meds:  allopurinol  100 mg Oral Daily   amLODipine  5 mg Oral Daily   apixaban  5 mg Oral BID   atorvastatin  80 mg Oral Daily   chlorhexidine  15 mL Mouth Rinse BID   Chlorhexidine Gluconate Cloth  6 each Topical Daily   cloNIDine  0.3 mg Transdermal Weekly   ferrous sulfate  325 mg Oral Q breakfast   finasteride  5 mg Oral Daily   gabapentin  300 mg Oral TID   hydrALAZINE  100 mg Oral Q8H   insulin aspart  0-9 Units Subcutaneous TID WC   lactulose  10 g Oral BID   levothyroxine  50 mcg Per Tube Q0600   mouth rinse  15 mL Mouth Rinse q12n4p   metoprolol tartrate  50 mg Oral BID   [START ON 09/28/2021] naloxegol oxalate  12.5 mg Oral Daily   oxyCODONE  30 mg Oral Q8H   pantoprazole (PROTONIX) IV  40 mg Intravenous Q12H   saccharomyces boulardii  250 mg Oral BID   senna-docusate  1 tablet Oral BID   simethicone  80 mg Oral QID   tamsulosin  0.4 mg Oral Daily   vitamin B-12  200 mcg Oral Daily    Continuous Infusions:  promethazine (PHENERGAN) injection (IM or IVPB) Stopped (09/25/21 1036)     LOS: 10 days     Alma Friendly, MD [facility]  If 7PM-7AM, please contact night-coverage [email or web address] 09/27/2021, 5:34 PM
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
## 2021-09-27 NOTE — Progress Notes (Signed)
Physical Therapy Treatment Patient Details Name: Thomas [name] MRN: [id number] DOB: [birth year] Today's Date: 09/27/2021   History of Present Illness Thomas [name] is an 70 y.o. male past medical history of DM type 2, HTN, CKD stage IV, chronic normocytic anemia, brought into the ED from home via EMS due to concern by his brother BJ not looking well, not eating or taking his medications for several days.  Upon EMS arrival, his blood glucose was 57 was given D10.  Work-up revealed AKI on CKD 4, suspected prerenal secondary to dehydration.  Also revealed left lower lobe community-acquired pneumonia, left lower extremity cellulitis and age-indeterminate left lower extremity DVT.  MRI left foot could not rule out second digit osteomyelitis. He received empiric IV antibiotics x 6 days.  MRI thoracic spine 09/17/2021 did not show evidence of discitis. Hospital course complicated by intractable nausea and vomiting for which GI was consulted.  Abdominal x-ray was unrevealing.  Planned for EGD, but canceled due to severe delirium.  PCCM consulted on 12/5 due to significant worsening of delirium and combativeness with intermittent unresponsiveness, and hypertensive crisis.  Patient was intubated on 12/5 and extubated on 12/6.  Patient was further stabilized in the ICU and [facility] assumed care on 09/25/2021.    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  Stated he was at a Rehab for months then when he went home "things went down hill from there".  General bed mobility comments: with increased time and HOB elevated, pt was able to transition to EOB with use of rail as well.  Mild c/o dizziness.  BP 151/84.  Slight increase in back pain.  Pt on scheduled pain meds. Pt was able to self Timmothy Sours his R prothesis .General transfer comment: first used STEDY as pt c/o max weakness.  Pt was able to self rise from elevted bed onto STEDY and support his own weight.  Then used RW +  2 side by side assist, pt was able to rise using forward momentum to stand upright.  MAX c/o weakness.  Pt stated, "I feel as weak as water".General Gait Details: pt was only able to advance gait one foot with + 2 side by side assist.  MAX c/o weakness.  "I have not stood up in ten days". stated pt. Pt will need ST Rehab prior to returning home.   Recommendations for follow up therapy are one component of a multi-disciplinary discharge planning process, led by the attending physician.  Recommendations may be updated based on patient status, additional functional criteria and insurance authorization.  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  Mild c/o dizziness.  BP 151/84.  Slight increase in back pain.  Pt on scheduled pain meds. Pt was  able to self Timmothy Sours his R prothesis .    Transfers Overall transfer level: Needs assistance Equipment used: Rolling walker (2 wheels) Transfers: Sit to/from Stand Sit to Stand: +2 physical assistance;+2 safety/equipment;Mod assist           General transfer comment: first used STEDY as pt c/o max weakness.  Pt was able to self rise from elevted bed onto STEDY and support his own weight.  Then used RW + 2 side by side assist, pt  was able to rise using forward momentum to stand upright.  MAX c/o weakness.  Pt stated, "I feel as weak as water".    Ambulation/Gait Ambulation/Gait assistance: Mod assist;+2 physical assistance;+2 safety/equipment Gait Distance (Feet): 1 Feet Assistive device: Rolling walker (2 wheels) Gait Pattern/deviations: Step-to pattern;Decreased stance time - left Gait velocity: decreased     General Gait Details: pt was only able to advance gait one foot with + 2 side by side assist.  MAX c/o weakness.  "I have not stood up in ten days". stated pt.   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, educational Comments        Pertinent Vitals/Pain Pain Assessment: Faces Faces Pain Scale: Hurts little more Pain Location: back Pain Descriptors / Indicators: Discomfort;Aching Pain Intervention(s): Monitored during session;Premedicated before session    Home Living                          Prior Function            PT Goals (current goals can now be found in the care plan section) Progress towards PT goals: Progressing toward goals    Frequency    Min 2X/week      PT Plan Current plan remains appropriate;Frequency needs to be updated    Co-evaluation              AM-PAC PT "6 Clicks" Mobility   Outcome Measure  Help needed turning from your back to your side while in a flat bed without using bedrails?: A Little Help needed moving from lying on your back to sitting on the side of a flat bed without using bedrails?: A Little Help  needed moving to and from a bed to a chair (including a wheelchair)?: A Lot Help needed standing up from a chair using your arms (e.g., wheelchair or bedside chair)?: A Lot Help needed to walk in hospital room?: A Lot Help needed climbing 3-5 steps with a railing? : Total 6 Click Score: 13    End of Session Equipment Utilized During Treatment: Gait belt Activity Tolerance: Patient limited by fatigue;Patient limited by pain Patient left: in  chair;with chair alarm set;with call bell/phone within reach Nurse Communication: Mobility status PT Visit Diagnosis: Unsteadiness on feet (R26.81);Difficulty in walking, not elsewhere classified (R26.2);Muscle weakness (generalized) (M62.81)     Time: 8887-5797 PT Time Calculation (min) (ACUTE ONLY): 25 min  Charges:  $Gait Training: 8-22 mins $Therapeutic Activity: 8-22 mins                     {Karleigh Bunte  PTA Acute  Rehabilitation Services Pager      [phone] Office      [phone]

## 2021-09-27 NOTE — Progress Notes (Signed)
Speech Language Pathology Treatment: Dysphagia  Patient Details Name: Thomas [name] MRN: [id number] DOB: [birth year] Today's Date: 09/27/2021 Time: 1745-1800 SLP Time Calculation (min) (ACUTE ONLY): 15 min  Assessment / Plan / Recommendation Clinical Impression  Pt easily passed 3 ounce Yale water screen - - and is able to masticate without oral pocketing across all po intake.  Pt with excellent tolerance of po intake.  He also states he does not use dentures to eat - despite having informed SLP otherwise 2 days ago.  Pt appropriate for diet advancement to regular/thin.  SLP reviewed general precutions with pt using teach back.No SLP follow up indicated as swallow is back to baseline.    HPI HPI: 70 yo male adm to [facility] with AMS, weakness, found to have ARF on chronic kidney disease with RLE edema with cellulitis and DVT.  CXR showed infiltrates.  Pt with AMS, hypertensive emergency and difficulty protecting airway requiring intubation on 12/6- extubated 12/6.   Swallow evaluation ordered as pt did not pass 3 ounce Yale water screen.  He has also h/o multilevel DDD of cervical spine.      SLP Plan  All goals met      Recommendations for follow up therapy are one component of a multi-disciplinary discharge planning process, led by the attending physician.  Recommendations may be updated based on patient status, additional functional criteria and insurance authorization.    Recommendations  Diet recommendations: Regular;Thin liquid Liquids provided via: Cup;Straw Medication Administration: Whole meds with liquid Supervision: Patient able to self feed Compensations: Slow rate;Small sips/bites Postural Changes and/or Swallow Maneuvers: Seated upright 90 degrees;Upright 30-60 min after meal                Oral Care Recommendations: Oral care BID Follow Up Recommendations: No SLP follow up Assistance recommended at discharge: PRN SLP Visit Diagnosis: Dysphagia, unspecified  (R13.10) Plan: All goals met       GO               Kathleen Lime, MS [facility] SLP Acute Rehab Services Office [phone] Pager [phone]  [name]  09/27/2021, 6:02 PM

## 2021-09-28 LAB — CBC WITH DIFFERENTIAL/PLATELET
Abs Immature Granulocytes: 0.07 10*3/uL (ref 0.00–0.07)
Basophils Absolute: 0 10*3/uL (ref 0.0–0.1)
Basophils Relative: 1 %
Eosinophils Absolute: 0.3 10*3/uL (ref 0.0–0.5)
Eosinophils Relative: 4 %
HCT: 31.1 % — ABNORMAL LOW (ref 39.0–52.0)
Hemoglobin: 9.6 g/dL — ABNORMAL LOW (ref 13.0–17.0)
Immature Granulocytes: 1 %
Lymphocytes Relative: 21 %
Lymphs Abs: 1.5 10*3/uL (ref 0.7–4.0)
MCH: 31.1 pg (ref 26.0–34.0)
MCHC: 30.9 g/dL (ref 30.0–36.0)
MCV: 100.6 fL — ABNORMAL HIGH (ref 80.0–100.0)
Monocytes Absolute: 0.5 10*3/uL (ref 0.1–1.0)
Monocytes Relative: 7 %
Neutro Abs: 4.9 10*3/uL (ref 1.7–7.7)
Neutrophils Relative %: 66 %
Platelets: 245 10*3/uL (ref 150–400)
RBC: 3.09 MIL/uL — ABNORMAL LOW (ref 4.22–5.81)
RDW: 14.9 % (ref 11.5–15.5)
WBC: 7.3 10*3/uL (ref 4.0–10.5)
nRBC: 0 % (ref 0.0–0.2)

## 2021-09-28 LAB — GLUCOSE, CAPILLARY
Glucose-Capillary: 106 mg/dL — ABNORMAL HIGH (ref 70–99)
Glucose-Capillary: 109 mg/dL — ABNORMAL HIGH (ref 70–99)
Glucose-Capillary: 118 mg/dL — ABNORMAL HIGH (ref 70–99)
Glucose-Capillary: 125 mg/dL — ABNORMAL HIGH (ref 70–99)
Glucose-Capillary: 128 mg/dL — ABNORMAL HIGH (ref 70–99)
Glucose-Capillary: 165 mg/dL — ABNORMAL HIGH (ref 70–99)
Glucose-Capillary: 173 mg/dL — ABNORMAL HIGH (ref 70–99)

## 2021-09-28 LAB — BASIC METABOLIC PANEL
Anion gap: 9 (ref 5–15)
BUN: 35 mg/dL — ABNORMAL HIGH (ref 8–23)
CO2: 20 mmol/L — ABNORMAL LOW (ref 22–32)
Calcium: 7.5 mg/dL — ABNORMAL LOW (ref 8.9–10.3)
Chloride: 110 mmol/L (ref 98–111)
Creatinine, Ser: 3.03 mg/dL — ABNORMAL HIGH (ref 0.61–1.24)
GFR, Estimated: 22 mL/min — ABNORMAL LOW (ref >60)
Glucose, Bld: 119 mg/dL — ABNORMAL HIGH (ref 70–99)
Potassium: 4.7 mmol/L (ref 3.5–5.1)
Sodium: 139 mmol/L (ref 135–145)

## 2021-09-28 NOTE — Progress Notes (Signed)
ANTICOAGULATION CONSULT NOTE - Follow Up Consult  Pharmacy Consult for Eliquis Indication: acute DVT  Allergies  Allergen Reactions   Other Other (See Comments)    Blood pressure issues Per [facility] hospital: Pt states he can only take these pain meds or else he gets very sick: Oxycontin,morphine,demerol, and dilaudid are the only pain meds pt states he can take.     Oxymorphone Other (See Comments)    Causes kidney problems   Aspirin Nausea And Vomiting    Per [facility]   Beta Vulgaris Nausea And Vomiting    Beets   Buspirone Other (See Comments)    Unknown reaction   Cabbage Nausea And Vomiting   Codeine Other (See Comments)    unknown   Dolobid [Diflunisal]     Unknown reaction   Fish Allergy Nausea And Vomiting   Fish-Derived Products Nausea And Vomiting   Methadone     Unknown reaction   Pentazocine Other (See Comments)    Unknown reaction   Propoxyphene Other (See Comments)    Unknown reaction   Shellfish Allergy Nausea And Vomiting   Sulfa Antibiotics Hives   Sulfasalazine Other (See Comments)    Unknown reaction   Vistaril [Hydroxyzine]     Unknown reaction   Amoxicillin Rash    Per [facility]    Patient Measurements: Height: 6\' 6"  (198.1 cm) Weight: 121.7 kg (268 lb 4.8 oz) IBW/kg (Calculated) : 91.4 Heparin Dosing Weight:   Vital Signs: Temp: 98.6 F (37 C) (12/10 0420) Temp Source: Oral (12/10 0420) BP: 156/95 (12/10 0420) Pulse Rate: 67 (12/10 0420)  Labs: Recent Labs    09/25/21 1343 09/26/21 0530 09/26/21 0530 09/27/21 0611 09/28/21 0551  HGB  --  9.8*   < > 8.9* 9.6*  HCT  --  31.9*  --  29.4* 31.1*  PLT  --  256  --  243 245  HEPARINUNFRC 0.62  --   --   --   --   CREATININE  --  3.08*  --  3.07* 3.03*   < > = values in this interval not displayed.    Estimated Creatinine Clearance: 34.2 mL/min (A) (by C-G formula based on SCr of 3.03 mg/dL (H)).   Assessment: Patient is a 70 y.o M presented to the ED on 11/28  with c/o LE swelling and pain. LE doppler on 11/29 showed age indeterminate DVT of L femoral vein. Pt was initially anticoagulated with LMWH and transitioned to apixaban. Apixaban was  subsequently transitioned to IV UFH on 12/4 due to inability to take PO medications and need for EGD. He's nor back on Eliquis.  - cbc stable - scr down 3.03   Plan:  - continue Eliquis 5 mg bid - pharmacy will sign off, but will follow pt peripherally along with you. Re-consult [place] if need further assistance.  Ryder Man P 09/28/2021,11:53 AM

## 2021-09-28 NOTE — Progress Notes (Signed)
PROGRESS NOTE  Thomas [name] CVE:[id number] DOB: 1951/02/24 DOA: 09/16/2021 PCP: Antonietta Jewel, MD  HPI/Recap of past 24 hours: Thomas [name] is an 70 y.o. male past medical history of DM type 2, HTN, CKD stage IV, chronic normocytic anemia, brought into the ED from home via EMS due to concern by his brother Thomas [name] not looking well, not eating or taking his medications for several days.  Upon EMS arrival, his blood glucose was 57 was given D10.  Work-up revealed AKI on CKD 4, suspected prerenal secondary to dehydration.  Also revealed left lower lobe community-acquired pneumonia, left lower extremity cellulitis and age-indeterminate left lower extremity DVT.  MRI left foot could not rule out second digit osteomyelitis. He received empiric IV antibiotics x 6 days.  MRI thoracic spine 09/17/2021 did not show evidence of discitis. Hospital course complicated by intractable nausea and vomiting for which GI was consulted.  Abdominal x-ray was unrevealing.  Planned for EGD, but canceled due to severe delirium.  PCCM consulted on 12/5 due to significant worsening of delirium and combativeness with intermittent unresponsiveness, and hypertensive crisis.  Patient was intubated on 12/5 and extubated on 12/6.  Patient was further stabilized in the ICU and [facility] hospitalist assumed care on 09/25/2021.  09/28/2021: Patient was seen at bedside.  He has no new complaints.  Left foot pain is controlled.   Assessment/Plan: Principal Problem:   ARF (acute renal failure) (HCC) Active Problems:   [name], right ([place])   [name], type 2) ([place])   HTN (hypertension)   BPH (benign prostatic hyperplasia)   Chronic pain syndrome   AKI (acute kidney injury) ([place])   CAP (community acquired pneumonia)   Delirium/Acute metabolic encephalopathy Noted for hypertensive emergency/PRES Resolved CT head could not rule out left occipital lobe infarct due to motion Consider MRI if encephalopathic  again Strict BP control Delirium precautions  Hypertensive emergency-resolved Hx of HTN BP stable Continue clonidine patch, hydralazine, amlodipine, metoprolol Monitor closely  Acute respiratory failure with hypoxemia due to inability to protect airway Resolved Required intubation on 12/5, extubated on 12/6 Weaned to RA Monitor closely  LLL CAP, POA Completed 6 days of Rocephin and 5 days of azithromycin  Acute kidney injury on chronic kidney disease stage IV Ongoing Baseline creatinine around 2.4, on admission 4.62 Continue to hold off home Lasix S/p IV gentle hydration Daily BMP  Chronic systolic HF Currently appears euvolemic Echo done showed EF of 35 to 40%, left ventricle global hypokinesis, severely elevated pulmonary artery systolic pressure Holding home Lasix for now, consider restarting soon pending creatinine  Left lower extremity age-indeterminate DVT, seen on Doppler ultrasound 09/17/2021, POA Due to significant delirium and possibility of EGD, patient was started on heparin drip on 09/22/2021, discontinued  Continue Eliquis   Hypocalcemia Replace as needed   Moderate to severe R hydroureteronephrosis seen on CT scan Increased compared to prior exam, no evidence of obstructing calculus Management per urology-Foley catheter for about 1 week, and attempt voiding trial, possible stricture.  Ureteral stent not recommended Continue Flomax, finasteride   Treated left lower extremity cellulitis, POA Received 6 days of empiric IV antibiotics MRI of the left foot, osteomyelitis of second digit could not be completely ruled out, unlikely per podiatry, Dr. Posey Pronto.  Seen by podiatry Dr. Posey Pronto, plan to follow-up outpatient.  No evidence of infection or osteomyelitis to the second digits.  No local wound care indicated.  Okay to be discharged from podiatry standpoint.  Will  need to follow-up with podiatry outpatient. MRSA screen positive 09/21/21   History of upper GI  bleed with ground coffee emesis/LA grade A seen on EGD done on 11/25/2019 by Dr. Benson Norway  Continue IV Protonix 40 mg twice daily. Hemoglobin stable No overt bleeding Planned for EGD on 09/23/2021, canceled due to delirium Appreciate GIs assistance.   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  Diabetes mellitus type 2 Hemoglobin A1c 6.0 09/20/21 ISS  Anemia of chronic renal disease/iron deficiency anemia: Iron deficiency noted on iron studies. Hemoglobin stable Continue iron supplement  Chronic pain Continue pain management and bowel regimen   Physical debility PT assessed and recommended SNF Continue PT OT with assistance and fall precautions.   Obesity BMI 30 Recommend weight loss outpatient with regular physical activity and healthy dieting.   Dry eyes/left conjunctivitis, suspect bacterial Cipro eyedrops X 3 days, completed    Estimated body mass index is 31.01 kg/m as calculated from the following:   Height as of this encounter: 6\' 6"  (1.981 m).   Weight as of this encounter: 121.7 kg.     Code Status: Full  Family Communication: None at bedside  Disposition Plan: Status is: Inpatient  Remains inpatient appropriate because: Level of care    Consultants: Podiatry GI PCCM   Procedures: Mechanical ventilation on 12/5-12/6  Antimicrobials: Currently none  DVT prophylaxis: Eliquis   Objective: Vitals:   09/27/21 0417 09/27/21 1321 09/27/21 2027 09/28/21 0420  BP: (!) 154/88 (!) 146/82 (!) 162/78 (!) 156/95  Pulse: 66 74 73 67  Resp: 20 19 20 20   Temp: 98.5 F (36.9 C) 99.6 F (37.6 C) 98.7 F (37.1 C) 98.6 F (37 C)  TempSrc:  Oral Oral Oral  SpO2: 99% 99% 97% 99%  Weight: 123.1 kg   121.7 kg  Height:        Intake/Output Summary (Last 24 hours) at 09/28/2021 1401 Last data filed at 09/28/2021 1228 Gross per 24 hour  Intake --  Output 2700 ml  Net  -2700 ml   Filed Weights   09/26/21 0500 09/27/21 0417 09/28/21 0420  Weight: 121.3 kg 123.1 kg 121.7 kg    Exam: General: NAD Cardiovascular: S1, S2 present Respiratory: CTAB Abdomen: Soft, nontender, nondistended, bowel sounds present Musculoskeletal: No bilateral pedal edema noted, right BKA Skin: Normal Psychiatry: Normal mood    Data Reviewed: CBC: Recent Labs  Lab 09/23/21 0952 09/24/21 0303 09/25/21 0321 09/26/21 0530 09/27/21 0611 09/28/21 0551  WBC 12.6* 7.9 7.7 6.2 7.1 7.3  NEUTROABS 11.1*  --  5.1 3.9 4.6 4.9  HGB 11.7* 10.1* 9.9* 9.8* 8.9* 9.6*  HCT 36.8* 32.7* 33.0* 31.9* 29.4* 31.1*  MCV 95.6 99.1 102.2* 102.9* 102.1* 100.6*  PLT 339 260 258 256 243 720   Basic Metabolic Panel: Recent Labs  Lab 09/22/21 0957 09/23/21 0952 09/23/21 1920 09/24/21 0303 09/25/21 0321 09/26/21 0530 09/27/21 0611 09/28/21 0551  NA 140 139  --   --  144 141 139 139  K 4.0 3.9  --   --  4.1 4.5 4.1 4.7  CL 109 110  --   --  113* 110 110 110  CO2 25 17*  --   --  23 22 23  20*  GLUCOSE 109* 181*  --   --  122* 107* 99 119*  BUN 24* 28*  --   --  40* 36* 34* 35*  CREATININE 2.43* 2.54*  --   --  2.75* 3.08* 3.07* 3.03*  CALCIUM 7.5* 7.6*  --   --  7.1* 7.1* 7.2* 7.5*  MG 1.4* 1.7 2.0 2.0  --   --   --   --   PHOS 3.3 3.8 4.4 5.3*  --   --   --   --    GFR: Estimated Creatinine Clearance: 34.2 mL/min (A) (by C-G formula based on SCr of 3.03 mg/dL (H)). Liver Function Tests: Recent Labs  Lab 09/22/21 0957 09/23/21 0952 09/25/21 0321  AST 42* 49* 31  ALT 17 20 13   ALKPHOS 88 94 73  BILITOT 0.6 1.1 0.7  PROT 7.1 7.9 7.0  ALBUMIN 3.0* 3.3* 2.9*   Recent Labs  Lab 09/22/21 0957  LIPASE 32   Recent Labs  Lab 09/23/21 1542  AMMONIA 22   Coagulation Profile: No results for input(s): INR, PROTIME in the last 168 hours. Cardiac Enzymes: No results for input(s): CKTOTAL, CKMB, CKMBINDEX, TROPONINI in the last 168 hours. BNP (last 3 results) No results for  input(s): PROBNP in the last 8760 hours. HbA1C: No results for input(s): HGBA1C in the last 72 hours. CBG: Recent Labs  Lab 09/27/21 2028 09/27/21 2359 09/28/21 0421 09/28/21 0755 09/28/21 1143  GLUCAP 166* 125* 128* 106* 118*   Lipid Profile: No results for input(s): CHOL, HDL, LDLCALC, TRIG, CHOLHDL, LDLDIRECT in the last 72 hours. Thyroid Function Tests: No results for input(s): TSH, T4TOTAL, FREET4, T3FREE, THYROIDAB in the last 72 hours.  Anemia Panel: No results for input(s): VITAMINB12, FOLATE, FERRITIN, TIBC, IRON, RETICCTPCT in the last 72 hours. Urine analysis:    Component Value Date/Time   COLORURINE YELLOW 09/17/2021 0808   APPEARANCEUR HAZY (A) 09/17/2021 0808   LABSPEC 1.011 09/17/2021 0808   PHURINE 5.0 09/17/2021 0808   GLUCOSEU NEGATIVE 09/17/2021 0808   HGBUR NEGATIVE 09/17/2021 0808   BILIRUBINUR NEGATIVE 09/17/2021 0808   KETONESUR NEGATIVE 09/17/2021 0808   PROTEINUR 100 (A) 09/17/2021 0808   NITRITE NEGATIVE 09/17/2021 0808   LEUKOCYTESUR LARGE (A) 09/17/2021 0808   Sepsis Labs: @LABRCNTIP (procalcitonin:4,lacticidven:4)  ) Recent Results (from the past 240 hour(s))  MRSA Next Gen by PCR, Nasal     Status: Abnormal   Collection Time: 09/21/21  6:16 AM   Specimen: Nasal Mucosa; Nasal Swab  Result Value Ref Range Status   MRSA by PCR Next Gen DETECTED (A) NOT DETECTED Final    Comment: RESULT CALLED TO, READ BACK BY AND VERIFIED WITH: HUGHUY, S. RN ON 09/21/2021 @ 0941 BY MECIAL J. (NOTE) The GeneXpert MRSA Assay (FDA approved for NASAL specimens only), is one component of a comprehensive MRSA colonization surveillance program. It is not intended to diagnose MRSA infection nor to guide or monitor treatment for MRSA infections. Test performance is not FDA approved in patients less than 39 years old. Performed at [name], [place] [address], [place], [place] [address]       Studies: No results found.  Scheduled Meds:   allopurinol  100 mg Oral Daily   amLODipine  5 mg Oral Daily   apixaban  5 mg Oral BID   atorvastatin  80 mg Oral Daily   chlorhexidine  15 mL Mouth Rinse BID   Chlorhexidine Gluconate Cloth  6 each Topical Daily   cloNIDine  0.3 mg Transdermal Weekly   ferrous sulfate  325 mg Oral Q breakfast   finasteride  5 mg Oral Daily   gabapentin  300 mg Oral TID   hydrALAZINE  100 mg Oral Q8H  insulin aspart  0-9 Units Subcutaneous TID WC   lactulose  10 g Oral BID   levothyroxine  50 mcg Per Tube Q0600   mouth rinse  15 mL Mouth Rinse q12n4p   metoprolol tartrate  50 mg Oral BID   naloxegol oxalate  12.5 mg Oral Daily   oxyCODONE  30 mg Oral Q8H   pantoprazole (PROTONIX) IV  40 mg Intravenous Q12H   saccharomyces boulardii  250 mg Oral BID   senna-docusate  1 tablet Oral BID   simethicone  80 mg Oral QID   tamsulosin  0.4 mg Oral Daily   vitamin B-12  200 mcg Oral Daily    Continuous Infusions:  promethazine (PHENERGAN) injection (IM or IVPB) Stopped (09/25/21 1036)     LOS: 11 days     Kayleen Memos, MD [facility]  If 7PM-7AM, please contact night-coverage [email or web address] 09/28/2021, 2:01 PM

## 2021-09-29 LAB — BASIC METABOLIC PANEL
Anion gap: 9 (ref 5–15)
BUN: 35 mg/dL — ABNORMAL HIGH (ref 8–23)
CO2: 23 mmol/L (ref 22–32)
Calcium: 7.5 mg/dL — ABNORMAL LOW (ref 8.9–10.3)
Chloride: 107 mmol/L (ref 98–111)
Creatinine, Ser: 2.76 mg/dL — ABNORMAL HIGH (ref 0.61–1.24)
GFR, Estimated: 24 mL/min — ABNORMAL LOW (ref >60)
Glucose, Bld: 111 mg/dL — ABNORMAL HIGH (ref 70–99)
Potassium: 4.4 mmol/L (ref 3.5–5.1)
Sodium: 139 mmol/L (ref 135–145)

## 2021-09-29 LAB — CBC WITH DIFFERENTIAL/PLATELET
Abs Immature Granulocytes: 0.07 10*3/uL (ref 0.00–0.07)
Basophils Absolute: 0 10*3/uL (ref 0.0–0.1)
Basophils Relative: 0 %
Eosinophils Absolute: 0.2 10*3/uL (ref 0.0–0.5)
Eosinophils Relative: 3 %
HCT: 29.5 % — ABNORMAL LOW (ref 39.0–52.0)
Hemoglobin: 9.1 g/dL — ABNORMAL LOW (ref 13.0–17.0)
Immature Granulocytes: 1 %
Lymphocytes Relative: 20 %
Lymphs Abs: 1.5 10*3/uL (ref 0.7–4.0)
MCH: 30.7 pg (ref 26.0–34.0)
MCHC: 30.8 g/dL (ref 30.0–36.0)
MCV: 99.7 fL (ref 80.0–100.0)
Monocytes Absolute: 0.6 10*3/uL (ref 0.1–1.0)
Monocytes Relative: 8 %
Neutro Abs: 4.8 10*3/uL (ref 1.7–7.7)
Neutrophils Relative %: 68 %
Platelets: 252 10*3/uL (ref 150–400)
RBC: 2.96 MIL/uL — ABNORMAL LOW (ref 4.22–5.81)
RDW: 15 % (ref 11.5–15.5)
WBC: 7.2 10*3/uL (ref 4.0–10.5)
nRBC: 0 % (ref 0.0–0.2)

## 2021-09-29 LAB — GLUCOSE, CAPILLARY
Glucose-Capillary: 100 mg/dL — ABNORMAL HIGH (ref 70–99)
Glucose-Capillary: 104 mg/dL — ABNORMAL HIGH (ref 70–99)
Glucose-Capillary: 115 mg/dL — ABNORMAL HIGH (ref 70–99)
Glucose-Capillary: 121 mg/dL — ABNORMAL HIGH (ref 70–99)
Glucose-Capillary: 126 mg/dL — ABNORMAL HIGH (ref 70–99)
Glucose-Capillary: 165 mg/dL — ABNORMAL HIGH (ref 70–99)

## 2021-09-29 MED ORDER — CALCIUM CARBONATE ANTACID 500 MG PO CHEW
1.0000 | CHEWABLE_TABLET | Freq: Three times a day (TID) | ORAL | Status: DC
  Administered 2021-09-29 – 2021-10-01 (×8): 200 mg via ORAL
  Filled 2021-09-29 (×8): qty 1

## 2021-09-29 MED ORDER — CALCIUM GLUCONATE 500 MG PO TABS: 1.0000 | ORAL_TABLET | Freq: Three times a day (TID) | ORAL | Status: DC

## 2021-09-29 MED ORDER — PANTOPRAZOLE SODIUM 40 MG PO TBEC
40.0000 mg | DELAYED_RELEASE_TABLET | Freq: Two times a day (BID) | ORAL | Status: DC
  Administered 2021-09-29 – 2021-10-01 (×5): 40 mg via ORAL
  Filled 2021-09-29 (×5): qty 1

## 2021-09-29 NOTE — Progress Notes (Signed)
PROGRESS NOTE  Thomas [name] QJJ:[id number] DOB: [birth year] DOA: 09/16/2021 PCP: Antonietta Jewel, MD  HPI/Recap of past 24 hours: Thomas [name] is an 70 y.o. male past medical history of DM type 2, HTN, CKD stage IV, chronic normocytic anemia, brought into the ED from home via EMS due to concern by his brother BJ not looking well, not eating or taking his medications for several days.  Upon EMS arrival, his blood glucose was 57 was given D10.  Work-up revealed AKI on CKD 4, suspected prerenal secondary to dehydration.  Also revealed left lower lobe community-acquired pneumonia, left lower extremity cellulitis and age-indeterminate left lower extremity DVT.  MRI left foot could not rule out second digit osteomyelitis. He received empiric IV antibiotics x 6 days.  MRI thoracic spine 09/17/2021 did not show evidence of discitis. Hospital course complicated by intractable nausea and vomiting for which GI was consulted.  Abdominal x-ray was unrevealing.  Planned for EGD, but canceled due to severe delirium.  PCCM consulted on 12/5 due to significant worsening of delirium and combativeness with intermittent unresponsiveness, and hypertensive crisis.  Patient was intubated on 12/5 and extubated on 12/6.  Patient was further stabilized in the ICU and [facility] hospitalist assumed care on 09/25/2021.  09/29/2021: Patient was seen at his bedside.  There were no acute events overnight.  He has no new complaints.   Assessment/Plan: Principal Problem:   ARF (acute renal failure) (HCC) Active Problems:   [name], right ([place])   [name], type 2) ([place])   HTN (hypertension)   BPH (benign prostatic hyperplasia)   Chronic pain syndrome   AKI (acute kidney injury) ([place])   CAP (community acquired pneumonia)   Resolved delirium/Acute metabolic encephalopathy Noted for hypertensive emergency/PRES Resolved CT head could not rule out left occipital lobe infarct due to motion Consider MRI if  encephalopathic again Strict BP control Delirium precautions  Hypertensive emergency-resolved Hx of HTN BP stable Continue clonidine patch, hydralazine, amlodipine, metoprolol Monitor closely  Acute respiratory failure with hypoxemia due to inability to protect airway Resolved Required intubation on 12/5, extubated on 12/6 Weaned to RA Monitor closely  Anemia of chronic renal disease/iron deficiency anemia: Iron deficiency noted on iron studies. Hemoglobin downtrending 9.1 from 9.6 No overt bleeding Continue iron supplement  Acute kidney injury on chronic kidney disease stage IV Ongoing Baseline creatinine around 2.4, on admission 4.62 Creatinine is downtrending 2.76 from 3.03. Continue to hold off home Lasix Has received IV gentle hydration Daily BMP  Chronic systolic HF Currently appears euvolemic Echo done showed EF of 35 to 40%, left ventricle global hypokinesis, severely elevated pulmonary artery systolic pressure Holding home Lasix for now, consider restarting soon pending creatinine  Left lower extremity age-indeterminate DVT, seen on Doppler ultrasound 09/17/2021, POA Due to significant delirium and possibility of EGD, patient was started on heparin drip on 09/22/2021, discontinued  Continue Eliquis  Treated LLL CAP, POA Completed 6 days of Rocephin and 5 days of azithromycin   Hypocalcemia Replace as needed   Moderate to severe R hydroureteronephrosis seen on CT scan Increased compared to prior exam, no evidence of obstructing calculus Management per urology-Foley catheter for about 1 week, and attempt voiding trial, possible stricture.  Ureteral stent not recommended Continue Flomax, finasteride   Treated left lower extremity cellulitis, POA Received 6 days of empiric IV antibiotics MRI of the left foot, osteomyelitis of second digit could not be completely ruled out, unlikely per podiatry, Dr. Posey Pronto.  Seen by podiatry Dr. Posey Pronto, plan to follow-up  outpatient.  No evidence of infection or osteomyelitis to the second digits.  No local wound care indicated.  Okay to be discharged from podiatry standpoint.  Will need to follow-up with podiatry outpatient. MRSA screen positive 09/21/21   History of upper GI bleed with ground coffee emesis/LA grade A seen on EGD done on 11/25/2019 by Dr. Benson Norway   IV Protonix 40 mg twice daily, switched to p.o. Protonix 40 mg twice daily on 09/29/2021. Hemoglobin stable No overt bleeding Planned for EGD on 09/23/2021, canceled due to delirium Appreciate GIs assistance.   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  Diabetes mellitus type 2 Hemoglobin A1c 6.0 09/20/21 ISS  Chronic pain Continue pain management and bowel regimen   Physical debility PT assessed and recommended SNF Continue PT OT with assistance and fall precautions.   Obesity BMI 30 Recommend weight loss outpatient with regular physical activity and healthy dieting.   Dry eyes/left conjunctivitis, suspect bacterial Cipro eyedrops X 3 days, completed    Estimated body mass index is 31.79 kg/m as calculated from the following:   Height as of this encounter: 6\' 6"  (1.981 m).   Weight as of this encounter: 124.8 kg.     Code Status: Full  Family Communication: None at bedside  Disposition Plan: Status is: Inpatient  Remains inpatient appropriate because: Level of care    Consultants: Podiatry GI PCCM   Procedures: Mechanical ventilation on 12/5-12/6  Antimicrobials: Currently none  DVT prophylaxis: Eliquis   Objective: Vitals:   09/28/21 1406 09/28/21 2002 09/29/21 0327 09/29/21 0329  BP: (!) 158/97 132/67 (!) 151/96   Pulse: 70 82 70   Resp: 20 20 16    Temp: 98.9 F (37.2 C) 97.9 F (36.6 C) 98.2 F (36.8 C)   TempSrc: Oral Oral Oral   SpO2: 96% 96% 96%   Weight:    124.8 kg  Height:        Intake/Output Summary  (Last 24 hours) at 09/29/2021 1318 Last data filed at 09/29/2021 0338 Gross per 24 hour  Intake --  Output 1650 ml  Net -1650 ml   Filed Weights   09/27/21 0417 09/28/21 0420 09/29/21 0329  Weight: 123.1 kg 121.7 kg 124.8 kg    Exam: General: Well-developed well-nourished in no acute distress.  He is alert and oriented x3. Cardiovascular: Regular rate and rhythm no rubs or gallops.  No JVD or thyromegaly noted. Respiratory: Clear to auscultation with no wheezes or rales Abdomen: Soft nontender normal bowel sounds present. Musculoskeletal: No lower extremity edema bilaterally. Skin: No ulcerative lesions noted. Psychiatry: Mood is appropriate for condition and setting.   Data Reviewed: CBC: Recent Labs  Lab 09/25/21 0321 09/26/21 0530 09/27/21 0611 09/28/21 0551 09/29/21 0601  WBC 7.7 6.2 7.1 7.3 7.2  NEUTROABS 5.1 3.9 4.6 4.9 4.8  HGB 9.9* 9.8* 8.9* 9.6* 9.1*  HCT 33.0* 31.9* 29.4* 31.1* 29.5*  MCV 102.2* 102.9* 102.1* 100.6* 99.7  PLT 258 256 243 245 790   Basic Metabolic Panel: Recent Labs  Lab 09/23/21 0952 09/23/21 1920 09/24/21 0303 09/25/21 0321 09/26/21 0530 09/27/21 0611 09/28/21 0551 09/29/21 0601  NA 139  --   --  144 141 139 139 139  K 3.9  --   --  4.1 4.5 4.1 4.7 4.4  CL 110  --   --  113* 110 110 110 107  CO2 17*  --   --  23 22 23  20* 23  GLUCOSE 181*  --   --  122* 107* 99 119* 111*  BUN 28*  --   --  40* 36* 34* 35* 35*  CREATININE 2.54*  --   --  2.75* 3.08* 3.07* 3.03* 2.76*  CALCIUM 7.6*  --   --  7.1* 7.1* 7.2* 7.5* 7.5*  MG 1.7 2.0 2.0  --   --   --   --   --   PHOS 3.8 4.4 5.3*  --   --   --   --   --    GFR: Estimated Creatinine Clearance: 38 mL/min (A) (by C-G formula based on SCr of 2.76 mg/dL (H)). Liver Function Tests: Recent Labs  Lab 09/23/21 0952 09/25/21 0321  AST 49* 31  ALT 20 13  ALKPHOS 94 73  BILITOT 1.1 0.7  PROT 7.9 7.0  ALBUMIN 3.3* 2.9*   No results for input(s): LIPASE, AMYLASE in the last 168  hours.  Recent Labs  Lab 09/23/21 1542  AMMONIA 22   Coagulation Profile: No results for input(s): INR, PROTIME in the last 168 hours. Cardiac Enzymes: No results for input(s): CKTOTAL, CKMB, CKMBINDEX, TROPONINI in the last 168 hours. BNP (last 3 results) No results for input(s): PROBNP in the last 8760 hours. HbA1C: No results for input(s): HGBA1C in the last 72 hours. CBG: Recent Labs  Lab 09/28/21 1959 09/28/21 2314 09/29/21 0331 09/29/21 0823 09/29/21 1128  GLUCAP 173* 165* 104* 100* 115*   Lipid Profile: No results for input(s): CHOL, HDL, LDLCALC, TRIG, CHOLHDL, LDLDIRECT in the last 72 hours. Thyroid Function Tests: No results for input(s): TSH, T4TOTAL, FREET4, T3FREE, THYROIDAB in the last 72 hours.  Anemia Panel: No results for input(s): VITAMINB12, FOLATE, FERRITIN, TIBC, IRON, RETICCTPCT in the last 72 hours. Urine analysis:    Component Value Date/Time   COLORURINE YELLOW 09/17/2021 0808   APPEARANCEUR HAZY (A) 09/17/2021 0808   LABSPEC 1.011 09/17/2021 0808   PHURINE 5.0 09/17/2021 0808   GLUCOSEU NEGATIVE 09/17/2021 0808   HGBUR NEGATIVE 09/17/2021 0808   BILIRUBINUR NEGATIVE 09/17/2021 0808   KETONESUR NEGATIVE 09/17/2021 0808   PROTEINUR 100 (A) 09/17/2021 0808   NITRITE NEGATIVE 09/17/2021 0808   LEUKOCYTESUR LARGE (A) 09/17/2021 0808   Sepsis Labs: @LABRCNTIP (procalcitonin:4,lacticidven:4)  ) Recent Results (from the past 240 hour(s))  MRSA Next Gen by PCR, Nasal     Status: Abnormal   Collection Time: 09/21/21  6:16 AM   Specimen: Nasal Mucosa; Nasal Swab  Result Value Ref Range Status   MRSA by PCR Next Gen DETECTED (A) NOT DETECTED Final    Comment: RESULT CALLED TO, READ BACK BY AND VERIFIED WITH: HUGHUY, S. RN ON 09/21/2021 @ 0941 BY MECIAL J. (NOTE) The GeneXpert MRSA Assay (FDA approved for NASAL specimens only), is one component of a comprehensive MRSA colonization surveillance program. It is not intended to diagnose MRSA  infection nor to guide or monitor treatment for MRSA infections. Test performance is not FDA approved in patients less than 27 years old. Performed at [facility], [place] [address], [place], [place] [address]       Studies: No results found.  Scheduled Meds:  allopurinol  100 mg Oral Daily   amLODipine  5 mg Oral Daily   apixaban  5 mg Oral BID   atorvastatin  80 mg Oral Daily   calcium carbonate  1 tablet Oral TID   chlorhexidine  15 mL Mouth Rinse BID   Chlorhexidine Gluconate Cloth  6 each Topical Daily   cloNIDine  0.3 mg Transdermal Weekly   ferrous sulfate  325 mg Oral Q breakfast   finasteride  5 mg Oral Daily   gabapentin  300 mg Oral TID   hydrALAZINE  100 mg Oral Q8H   insulin aspart  0-9 Units Subcutaneous TID WC   lactulose  10 g Oral BID   levothyroxine  50 mcg Per Tube Q0600   mouth rinse  15 mL Mouth Rinse q12n4p   metoprolol tartrate  50 mg Oral BID   naloxegol oxalate  12.5 mg Oral Daily   oxyCODONE  30 mg Oral Q8H   pantoprazole (PROTONIX) IV  40 mg Intravenous Q12H   saccharomyces boulardii  250 mg Oral BID   senna-docusate  1 tablet Oral BID   simethicone  80 mg Oral QID   tamsulosin  0.4 mg Oral Daily   vitamin B-12  200 mcg Oral Daily    Continuous Infusions:  promethazine (PHENERGAN) injection (IM or IVPB) Stopped (09/25/21 1036)     LOS: 12 days     Kayleen Memos, MD [facility]  If 7PM-7AM, please contact night-coverage [email or web address] 09/29/2021, 1:18 PM

## 2021-09-30 ENCOUNTER — Inpatient Hospital Stay (HOSPITAL_COMMUNITY): Payer: Medicare Other

## 2021-09-30 LAB — CBC WITH DIFFERENTIAL/PLATELET
Abs Immature Granulocytes: 0.06 10*3/uL (ref 0.00–0.07)
Basophils Absolute: 0 10*3/uL (ref 0.0–0.1)
Basophils Relative: 1 %
Eosinophils Absolute: 0.2 10*3/uL (ref 0.0–0.5)
Eosinophils Relative: 3 %
HCT: 29.9 % — ABNORMAL LOW (ref 39.0–52.0)
Hemoglobin: 9.3 g/dL — ABNORMAL LOW (ref 13.0–17.0)
Immature Granulocytes: 1 %
Lymphocytes Relative: 20 %
Lymphs Abs: 1.4 10*3/uL (ref 0.7–4.0)
MCH: 31.3 pg (ref 26.0–34.0)
MCHC: 31.1 g/dL (ref 30.0–36.0)
MCV: 100.7 fL — ABNORMAL HIGH (ref 80.0–100.0)
Monocytes Absolute: 0.6 10*3/uL (ref 0.1–1.0)
Monocytes Relative: 8 %
Neutro Abs: 4.8 10*3/uL (ref 1.7–7.7)
Neutrophils Relative %: 67 %
Platelets: 241 10*3/uL (ref 150–400)
RBC: 2.97 MIL/uL — ABNORMAL LOW (ref 4.22–5.81)
RDW: 15.1 % (ref 11.5–15.5)
WBC: 7.2 10*3/uL (ref 4.0–10.5)
nRBC: 0 % (ref 0.0–0.2)

## 2021-09-30 LAB — BASIC METABOLIC PANEL
Anion gap: 8 (ref 5–15)
BUN: 35 mg/dL — ABNORMAL HIGH (ref 8–23)
CO2: 23 mmol/L (ref 22–32)
Calcium: 7.5 mg/dL — ABNORMAL LOW (ref 8.9–10.3)
Chloride: 105 mmol/L (ref 98–111)
Creatinine, Ser: 2.87 mg/dL — ABNORMAL HIGH (ref 0.61–1.24)
GFR, Estimated: 23 mL/min — ABNORMAL LOW (ref >60)
Glucose, Bld: 128 mg/dL — ABNORMAL HIGH (ref 70–99)
Potassium: 4.6 mmol/L (ref 3.5–5.1)
Sodium: 136 mmol/L (ref 135–145)

## 2021-09-30 LAB — GLUCOSE, CAPILLARY
Glucose-Capillary: 123 mg/dL — ABNORMAL HIGH (ref 70–99)
Glucose-Capillary: 104 mg/dL — ABNORMAL HIGH (ref 70–99)
Glucose-Capillary: 126 mg/dL — ABNORMAL HIGH (ref 70–99)
Glucose-Capillary: 115 mg/dL — ABNORMAL HIGH (ref 70–99)
Glucose-Capillary: 152 mg/dL — ABNORMAL HIGH (ref 70–99)
Glucose-Capillary: 176 mg/dL — ABNORMAL HIGH (ref 70–99)

## 2021-09-30 MED ORDER — HYDRALAZINE HCL 100 MG PO TABS: 100.0000 mg | ORAL_TABLET | Freq: Three times a day (TID) | ORAL | 0 refills | Status: DC

## 2021-09-30 MED ORDER — CLONIDINE 0.3 MG/24HR TD PTWK: 0.3000 mg | MEDICATED_PATCH | TRANSDERMAL | 0 refills | Status: DC

## 2021-09-30 MED ORDER — NALOXEGOL OXALATE 12.5 MG PO TABS: 12.5000 mg | ORAL_TABLET | Freq: Every day | ORAL | 0 refills | Status: DC

## 2021-09-30 MED ORDER — FERROUS SULFATE 325 (65 FE) MG PO TABS: 325.0000 mg | ORAL_TABLET | Freq: Every day | ORAL | 0 refills | Status: DC

## 2021-09-30 MED ORDER — METOPROLOL TARTRATE 50 MG PO TABS: 50.0000 mg | ORAL_TABLET | Freq: Two times a day (BID) | ORAL | 0 refills | Status: DC

## 2021-09-30 MED ORDER — APIXABAN 5 MG PO TABS: 5.0000 mg | ORAL_TABLET | Freq: Two times a day (BID) | ORAL | 0 refills | Status: DC

## 2021-09-30 MED ORDER — CYANOCOBALAMIN 100 MCG PO TABS: 200.0000 ug | ORAL_TABLET | Freq: Every day | ORAL | 0 refills | Status: DC

## 2021-09-30 MED ORDER — BISACODYL 10 MG RE SUPP
10.0000 mg | Freq: Once | RECTAL | Status: DC
  Administered 2021-09-30: 10 mg via RECTAL
  Filled 2021-09-30: qty 1

## 2021-09-30 MED ORDER — PANTOPRAZOLE SODIUM 40 MG PO TBEC: 40.0000 mg | DELAYED_RELEASE_TABLET | Freq: Two times a day (BID) | ORAL | 0 refills | Status: DC

## 2021-09-30 MED ORDER — SACCHAROMYCES BOULARDII 250 MG PO CAPS: 250.0000 mg | ORAL_CAPSULE | Freq: Two times a day (BID) | ORAL | 0 refills | Status: DC

## 2021-09-30 NOTE — Evaluation (Signed)
Physical Therapy Treatment Patient Details Name: Thomas [name] MRN: [id number] DOB: [birth year] Today's Date: 09/30/2021   History of Present Illness Thomas [name] is an 70 y.o. male past medical history of DM type 2, HTN, CKD stage IV, chronic normocytic anemia, brought into the ED from home via EMS due to concern by his brother Thomas [name] not looking well, not eating or taking his medications for several days.  Upon EMS arrival, his blood glucose was 57 was given D10.  Work-up revealed AKI on CKD 4, suspected prerenal secondary to dehydration.  Also revealed left lower lobe community-acquired pneumonia, left lower extremity cellulitis and age-indeterminate left lower extremity DVT.  MRI left foot could not rule out second digit osteomyelitis. He received empiric IV antibiotics x 6 days.  MRI thoracic spine 09/17/2021 did not show evidence of discitis. Hospital course complicated by intractable nausea and vomiting for which GI was consulted.  Abdominal x-ray was unrevealing.  Planned for EGD, but canceled due to severe delirium.  PCCM consulted on 12/5 due to significant worsening of delirium and combativeness with intermittent unresponsiveness, and hypertensive crisis.  Patient was intubated on 12/5 and extubated on 12/6.  Patient was further stabilized in the ICU and [facility] hospitalist assumed care on 09/25/2021.    PT Comments    Pt is AxO x 3 and able to don his own prosthesis while sitting on EOB.  Pt does require assist to safely rise and transfer.  General transfer comment: pt was able to rise from elevated by pulling on walker and tendancy to use forward momentum. Assisted with standing a second time for recliner to assist with hygiene.  Pt will need ST Rehab at SNF before returning home.     Recommendations for follow up therapy are one component of a multi-disciplinary discharge planning process, led by the attending physician.  Recommendations may be updated based on patient status, additional  functional criteria and insurance authorization.  Follow Up Recommendations        Assistance Recommended at Discharge    Equipment Recommendations       Recommendations for Other Services       Precautions / Restrictions Precautions Precautions: Fall Precaution Comments: R BKA (has a Presthesis 68 years old) , Lt foot wounds Restrictions Weight Bearing Restrictions: No Other Position/Activity Restrictions: prosthesis and shoe in rroom     Mobility  Bed Mobility Overal bed mobility: Needs Assistance Bed Mobility: Supine to Sit     Supine to sit: Supervision;Min guard     General bed mobility comments: with increased time and HOB elevated, pt was able to transition to EOB with use of rail as well.  Slight increase in back pain.  Pt on scheduled pain meds. Pt was able to self Timmothy Sours his R prothesis .    Transfers   Equipment used: Rolling walker (2 wheels) Transfers: Sit to/from Stand Sit to Stand: +2 physical assistance;+2 safety/equipment;Mod assist           General transfer comment: pt was able to rise from elevated by pulling on walker and tendancy to use forward momentum.    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)        Exercises      General Comments        Pertinent Vitals/Pain Pain Assessment: Faces Pain Location: back Pain Descriptors / Indicators: Discomfort;Aching Pain Intervention(s): Monitored during session;Repositioned    Home Living                           Prior Function            PT Goals (current goals can now be found in the care plan section)      Frequency    Min 2X/week      PT Plan      Co-evaluation              AM-PAC PT "6 Clicks" Mobility   Outcome Measure  Help needed turning from your back to your side while in a flat bed without using bedrails?: A Little Help needed moving from lying on your back to sitting on the side of a flat bed without using bedrails?: A Little Help needed moving to and from a bed to a chair (including a wheelchair)?: A Lot Help needed standing up from a chair using your arms (e.g., wheelchair or bedside chair)?: A Lot Help needed to walk in hospital room?: A Lot Help needed climbing 3-5 steps with a railing? : Total 6 Click Score: 13    End of Session Equipment Utilized During Treatment: Gait belt Activity Tolerance: Patient limited by fatigue Patient left: in chair;with chair alarm set;with call bell/phone within reach   PT Visit Diagnosis: Unsteadiness on feet (R26.81);Difficulty in walking, not elsewhere classified (R26.2);Muscle weakness (generalized) (M62.81)     Time: 9201-0071 PT Time Calculation (min) (ACUTE ONLY): 21 min  Charges:  $Therapeutic Activity: 8-22 mins                     Rica Koyanagi  PTA Acute  Rehabilitation Services Pager      [phone] Office      [phone]

## 2021-09-30 NOTE — TOC Progression Note (Signed)
Transition of Care [facility]) - Progression Note    Patient Details  Name: LENIX KIDD MRN: [id number] Date of Birth: Oct 13, 1951  [facility] CM/SW Contact  Ross Ludwig, [place] Phone Number: 09/30/2021, 1:41 PM  Clinical Narrative:     CSW spoke to patient to see if he has changed his mind about going to SNF verse going home with home health.  Per patient he would rather still go home, home health PT, OT, RN, Soc Work, and aide has been setup with [employer].  Patient may change his mind tomorrow, but he feels like he can return back home.  Patient is alert and oriented x4 and has the right to go home despite PT recommendations.  If patient returns home and feels that he needs SNF, [facility] social worker can place him from home.   Expected Discharge Plan: Skilled Nursing Facility Barriers to Discharge: Barriers Resolved  Expected Discharge Plan and Services Expected Discharge Plan: [place]   Discharge Planning Services: CM Consult Post Acute Care Choice: [place] arrangements for the past 2 months: Apartment Expected Discharge Date: 09/21/21                         HH Arranged: RN, PT, OT, Nurse's Aide, Social Work [employer] Agency: [place] Date [place]: 09/20/21 Time [place]: [place] Representative spoke with at [place]: [place] ([place]) Interventions    Readmission Risk Interventions Readmission Risk Prevention Plan 09/20/2021 09/17/2019  Transportation Screening Complete Complete  PCP or Specialist Appt within 5-7 Days - Not Complete  Not Complete comments - snf vs home  PCP or Specialist Appt within 3-5 Days Complete -  Home Care Screening - Complete  Medication Review (RN CM) - Complete  HRI or Home Care Consult Complete -  Social Work Consult for [place] Planning/Counseling Complete -  Palliative Care Screening Not Applicable -  Medication Review Press photographer)  Complete -  Some recent data might be hidden

## 2021-09-30 NOTE — Plan of Care (Signed)
Pt Thomas [name], pleasant and cooperative with staff.  Up with assist of 1 and WW with RL Prosthetic to Chair for part of afternoon.  Continued PT/OT.  Plan for discharge home tomorrow with [facility] per TOC/MD notes.    Problem: Pain Managment: Goal: General experience of comfort will improve Outcome: Progressing   Problem: Safety: Goal: Ability to remain free from injury will improve Outcome: Progressing   Problem: Skin Integrity: Goal: Risk for impaired skin integrity will decrease Outcome: Progressing   Problem: Safety: Goal: Non-violent Restraint(s) Outcome: Progressing

## 2021-09-30 NOTE — Plan of Care (Signed)
  Problem: Education: Goal: Knowledge of General Education information will improve Description: Including pain rating scale, medication(s)/side effects and non-pharmacologic comfort measures Outcome: Progressing   Problem: Health Behavior/Discharge Planning: Goal: Ability to manage health-related needs will improve Outcome: Progressing   Problem: Clinical Measurements: Goal: Ability to maintain clinical measurements within normal limits will improve Outcome: Progressing Goal: Will remain free from infection Outcome: Progressing Goal: Diagnostic test results will improve Outcome: Progressing Goal: Respiratory complications will improve Outcome: Progressing Goal: Cardiovascular complication will be avoided Outcome: Progressing   Problem: Activity: Goal: Risk for activity intolerance will decrease Outcome: Progressing   Problem: Nutrition: Goal: Adequate nutrition will be maintained Outcome: Progressing   Problem: Coping: Goal: Level of anxiety will decrease Outcome: Progressing   Problem: Elimination: Goal: Will not experience complications related to bowel motility Outcome: Progressing Goal: Will not experience complications related to urinary retention Outcome: Progressing   Problem: Pain Managment: Goal: General experience of comfort will improve Outcome: Progressing   Problem: Safety: Goal: Ability to remain free from injury will improve Outcome: Progressing   Problem: Skin Integrity: Goal: Risk for impaired skin integrity will decrease Outcome: Progressing   Problem: Safety: Goal: Non-violent Restraint(s) Outcome: Progressing   Problem: Education: Goal: Knowledge of disease and its progression will improve Outcome: Progressing   Problem: Clinical Measurements: Goal: Complications related to the disease process or treatment will be avoided or minimized Outcome: Progressing Goal: Dialysis access will remain free of complications Outcome: Progressing    Problem: Fluid Volume: Goal: Fluid volume balance will be maintained or improved Outcome: Progressing   Problem: Urinary Elimination: Goal: Progression of disease will be identified and treated Outcome: Progressing

## 2021-09-30 NOTE — Discharge Summary (Signed)
Discharge Summary  Thomas [name] QPY:[id number] DOB: Aug 19, 1951  PCP: Antonietta Jewel, MD  Admit date: 09/16/2021 Discharge date: 09/30/2021  Time spent: 35 minutes   Recommendations for Outpatient Follow-up:  Follow up with urology. Follow up with PCP. Take your medications as prescribed.   Discharge Diagnoses:  Active Hospital Problems   Diagnosis Date Noted   ARF (acute renal failure) ([place]) 09/17/2021   CAP (community acquired pneumonia) 09/17/2021   AKI (acute kidney injury) ([place]) 06/02/2021   BPH (benign prostatic hyperplasia) 11/24/2019   Chronic pain syndrome 11/24/2019   [name], type 2) ([place]) 09/29/2018   HTN (hypertension) 09/29/2018   [name], right ([place]) 09/29/2018    Resolved Hospital Problems  No resolved problems to display.    Discharge Condition: Stable   Diet recommendation: Resume previous diet   Vitals:   09/30/21 0414 09/30/21 1443  BP: (!) 156/92 124/78  Pulse: 72 79  Resp: 16 20  Temp: 98.4 F (36.9 C) 98.3 F (36.8 C)  SpO2: 97% 91%    History of present illness:  Thomas [name] is an 70 y.o. male past medical history of DM type 2, HTN, CKD stage IV, chronic normocytic anemia, brought into the ED from home via EMS due to concern by his brother BJ not looking well, not eating or taking his medications for several days.  Upon EMS arrival, his blood glucose was 57 was given D10.  Work-up revealed AKI on CKD 4, suspected prerenal secondary to dehydration.  Also revealed left lower lobe community-acquired pneumonia, left lower extremity cellulitis and age-indeterminate left lower extremity DVT.  MRI left foot could not rule out second digit osteomyelitis. He received empiric IV antibiotics x 6 days.  MRI thoracic spine 09/17/2021 did not show evidence of discitis. Hospital course complicated by intractable nausea and vomiting for which GI was consulted.  Abdominal x-ray was unrevealing.  Planned for EGD, but canceled due to  severe delirium.  PCCM consulted on 12/5 due to significant worsening of delirium and combativeness with intermittent unresponsiveness, and hypertensive crisis.  Patient was intubated on 12/5 and extubated on 12/6.  Patient was further stabilized in the ICU and [facility] hospitalist assumed care on 09/25/2021.   09/30/2021: Seen and examined at his bedside.  He has no new complaints.      Hospital Course:  Principal Problem:   ARF (acute renal failure) (HCC) Active Problems:   [name], right ([place])   [name], type 2) ([place])   HTN (hypertension)   BPH (benign prostatic hyperplasia)   Chronic pain syndrome   AKI (acute kidney injury) ([place])   CAP (community acquired pneumonia)  Resolved delirium/Acute metabolic encephalopathy Noted for hypertensive emergency/PRES Resolved CT head could not rule out left occipital lobe infarct due to motion Consider MRI if encephalopathic again Strict BP control Delirium precautions   Hypertensive emergency-resolved Hx of HTN BP stable Continue clonidine patch, hydralazine, amlodipine, metoprolol Monitor closely   Acute respiratory failure with hypoxemia due to inability to protect airway Resolved Required intubation on 12/5, extubated on 12/6 Weaned to RA Monitor closely  Anemia of chronic renal disease/iron deficiency anemia: Iron deficiency noted on iron studies. Hemoglobin downtrending 9.1 from 9.6 No overt bleeding Continue iron supplement   Acute kidney injury on chronic kidney disease stage IV Ongoing Baseline creatinine around 2.4, on admission 4.62 Creatinine is downtrending 2.76 from 3.03. Continue to hold off home Lasix Has received IV gentle hydration Daily BMP  Chronic systolic HF Currently appears euvolemic Echo done showed EF of 35 to 40%, left ventricle global hypokinesis, severely elevated pulmonary artery systolic pressure Holding home Lasix for now, consider restarting soon pending creatinine    Left lower extremity age-indeterminate DVT, seen on Doppler ultrasound 09/17/2021, POA Due to significant delirium and possibility of EGD, patient was started on heparin drip on 09/22/2021, discontinued  Continue Eliquis   Treated LLL CAP, POA Completed 6 days of Rocephin and 5 days of azithromycin   Hypocalcemia Replace as needed   Moderate to severe R hydroureteronephrosis seen on CT scan Increased compared to prior exam, no evidence of obstructing calculus Management per urology-Foley catheter for about 1 week, and attempt voiding trial, possible stricture.  Ureteral stent not recommended Continue Flomax, finasteride   Treated left lower extremity cellulitis, POA Received 6 days of empiric IV antibiotics MRI of the left foot, osteomyelitis of second digit could not be completely ruled out, unlikely per podiatry, Dr. Posey Pronto.  Seen by podiatry Dr. Posey Pronto, plan to follow-up outpatient.  No evidence of infection or osteomyelitis to the second digits.  No local wound care indicated.  Okay to be discharged from podiatry standpoint.  Will need to follow-up with podiatry outpatient. MRSA screen positive 09/21/21   History of upper GI bleed with ground coffee emesis/LA grade A seen on EGD done on 11/25/2019 by Dr. Benson Norway   IV Protonix 40 mg twice daily, switched to p.o. Protonix 40 mg twice daily on 09/29/2021. Hemoglobin stable No overt bleeding Planned for EGD on 09/23/2021, canceled due to delirium Appreciate GIs assistance.   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  Diabetes mellitus type 2 Hemoglobin A1c 6.0 09/20/21 ISS  Chronic pain Continue pain management and bowel regimen   Physical debility PT assessed and recommended SNF Continue PT OT with assistance and fall precautions.   Obesity BMI 30 Recommend weight loss outpatient with regular physical activity and healthy dieting.   Dry  eyes/left conjunctivitis, suspect bacterial Cipro eyedrops X 3 days, completed     Estimated body mass index is 31.79 kg/m as calculated from the following:   Height as of this encounter: 6\' 6"  (1.981 m).   Weight as of this encounter: 124.8 kg.      Code Status: Full     Consultants: Podiatry GI PCCM     Procedures: Mechanical ventilation on 12/5-12/6   Antimicrobials: Currently none   DVT prophylaxis: Eliquis    Discharge Exam: BP 124/78 (BP Location: Left Arm)   Pulse 79   Temp 98.3 F (36.8 C) (Oral)   Resp 20   Ht 6\' 6"  (1.981 m)   Wt 124.8 kg   SpO2 91%   BMI 31.79 kg/m  General: 70 y.o. year-old male well developed well nourished in no acute distress.  Alert and oriented x3. Cardiovascular: Regular rate and rhythm with no rubs or gallops.  No thyromegaly or JVD noted.   Respiratory: Clear to auscultation with no wheezes or rales. Good inspiratory effort. Abdomen: Soft nontender nondistended with normal bowel sounds x4 quadrants. Musculoskeletal: No lower extremity edema bilaterally. Psychiatry: Mood is appropriate for condition and setting  Discharge Instructions You were cared for by a hospitalist during your hospital stay. If you have any questions about your discharge medications or the care you received while you were in the hospital after you are discharged, you can call the unit and asked to speak  with the hospitalist on call if the hospitalist that took care of you is not available. Once you are discharged, your primary care physician will handle any further medical issues. Please note that NO REFILLS for any discharge medications will be authorized once you are discharged, as it is imperative that you return to your primary care physician (or establish a relationship with a primary care physician if you do not have one) for your aftercare needs so that they can reassess your need for medications and monitor your lab values.   Allergies as of  09/30/2021       Reactions   Other Other (See Comments)   Blood pressure issues Per [facility] hospital: Pt states he can only take these pain meds or else he gets very sick: Oxycontin,morphine,demerol, and dilaudid are the only pain meds pt states he can take.    Oxymorphone Other (See Comments)   Causes kidney problems   Aspirin Nausea And Vomiting   Per [facility]   Beta Vulgaris Nausea And Vomiting   Beets   Buspirone Other (See Comments)   Unknown reaction   Cabbage Nausea And Vomiting   Codeine Other (See Comments)   unknown   Dolobid [diflunisal]    Unknown reaction   Fish Allergy Nausea And Vomiting   Fish-derived Products Nausea And Vomiting   Methadone    Unknown reaction   Pentazocine Other (See Comments)   Unknown reaction   Propoxyphene Other (See Comments)   Unknown reaction   Shellfish Allergy Nausea And Vomiting   Sulfa Antibiotics Hives   Sulfasalazine Other (See Comments)   Unknown reaction   Vistaril [hydroxyzine]    Unknown reaction   Amoxicillin Rash   Per [facility]        Medication List     STOP taking these medications    cloNIDine 0.3 MG tablet Commonly known as: CATAPRES   clotrimazole 1 % cream Commonly known as: LOTRIMIN   furosemide 40 MG tablet Commonly known as: LASIX   levofloxacin 500 MG tablet Commonly known as: LEVAQUIN   Zofran 4 MG tablet Generic drug: ondansetron       TAKE these medications    acetaminophen 500 MG tablet Commonly known as: TYLENOL Take 2 tablets (1,000 mg total) by mouth every 8 (eight) hours. What changed:  when to take this reasons to take this   allopurinol 100 MG tablet Commonly known as: ZYLOPRIM Take 100 mg by mouth daily.   amLODipine 5 MG tablet Commonly known as: NORVASC Take 5 tablets by mouth daily.   apixaban 5 MG Tabs tablet Commonly known as: ELIQUIS Take 1 tablet (5 mg total) by mouth 2 (two) times daily. Start taking on: October 22, 2021    atorvastatin 80 MG tablet Commonly known as: LIPITOR Take 1 tablet (80 mg total) by mouth daily.   cloNIDine 0.3 mg/24hr patch Commonly known as: CATAPRES - Dosed in mg/24 hr Place 1 patch (0.3 mg total) onto the skin once a week.   colchicine 0.6 MG tablet Take 0.6 mg by mouth daily.   cyanocobalamin 100 MCG tablet Take 2 tablets (200 mcg total) by mouth daily. What changed:  medication strength how much to take   ferrous sulfate 325 (65 FE) MG tablet Take 1 tablet (325 mg total) by mouth daily with breakfast. Start taking on: October 01, 2021   finasteride 5 MG tablet Commonly known as: PROSCAR Take 1 tablet (5 mg total) by mouth daily.   gabapentin 300 MG  capsule Commonly known as: NEURONTIN Take 1 capsule (300 mg total) by mouth at bedtime. What changed: when to take this   glipiZIDE 10 MG tablet Commonly known as: GLUCOTROL Take 10 mg by mouth daily before breakfast.   hydrALAZINE 100 MG tablet Commonly known as: APRESOLINE Take 1 tablet (100 mg total) by mouth every 8 (eight) hours. What changed: when to take this   lactulose 10 GM/15ML solution Commonly known as: CHRONULAC Take 15 mLs (10 g total) by mouth 2 (two) times daily.   levothyroxine 50 MCG tablet Commonly known as: SYNTHROID Take 50 mcg by mouth daily before breakfast.   methocarbamol 750 MG tablet Commonly known as: ROBAXIN Take 1 tablet (750 mg total) by mouth every 8 (eight) hours as needed for muscle spasms.   metoprolol tartrate 50 MG tablet Commonly known as: LOPRESSOR Take 1 tablet (50 mg total) by mouth 2 (two) times daily. What changed:  medication strength how much to take   naloxegol oxalate 12.5 MG Tabs tablet Commonly known as: MOVANTIK Take 1 tablet (12.5 mg total) by mouth daily. What changed:  medication strength how much to take   OxyCONTIN 30 MG 12 hr tablet Generic drug: oxyCODONE Take 1 tablet (30 mg total) by mouth 2 (two) times daily. What changed: when to  take this   pantoprazole 40 MG tablet Commonly known as: PROTONIX Take 1 tablet (40 mg total) by mouth 2 (two) times daily.   saccharomyces boulardii 250 MG capsule Commonly known as: FLORASTOR Take 1 capsule (250 mg total) by mouth 2 (two) times daily.   tamsulosin 0.4 MG Caps capsule Commonly known as: FLOMAX Take 1 capsule (0.4 mg total) by mouth daily.       ASK your doctor about these medications    cephALEXin 500 MG capsule Commonly known as: KEFLEX Take 1 capsule (500 mg total) by mouth every 8 (eight) hours for 5 days. Ask about: Should I take this medication?   doxycycline 100 MG tablet Commonly known as: VIBRA-TABS Take 1 tablet (100 mg total) by mouth every 12 (twelve) hours for 5 days. Ask about: Should I take this medication?       Allergies  Allergen Reactions   Other Other (See Comments)    Blood pressure issues Per [facility] hospital: Pt states he can only take these pain meds or else he gets very sick: Oxycontin,morphine,demerol, and dilaudid are the only pain meds pt states he can take.     Oxymorphone Other (See Comments)    Causes kidney problems   Aspirin Nausea And Vomiting    Per [facility]   Beta Vulgaris Nausea And Vomiting    Beets   Buspirone Other (See Comments)    Unknown reaction   Cabbage Nausea And Vomiting   Codeine Other (See Comments)    unknown   Dolobid [Diflunisal]     Unknown reaction   Fish Allergy Nausea And Vomiting   Fish-Derived Products Nausea And Vomiting   Methadone     Unknown reaction   Pentazocine Other (See Comments)    Unknown reaction   Propoxyphene Other (See Comments)    Unknown reaction   Shellfish Allergy Nausea And Vomiting   Sulfa Antibiotics Hives   Sulfasalazine Other (See Comments)    Unknown reaction   Vistaril [Hydroxyzine]     Unknown reaction   Amoxicillin Rash    Per [place] Follow up.  Specialty: Home Health  Services Why: For home health services Contact information: [address] [place] [address] [phone]         Felipa Furnace, DPM. Call today.   Specialty: Podiatry Contact information: [address] [address] [phone]         Felipa Furnace, DPM. Call today.   Specialty: Podiatry Why: Please call for a post hospital follow-up appointment. Contact information: [place] [address] [phone]         Antonietta Jewel, MD. Call today.   Specialty: Internal Medicine Why: Please call for a posthospital follow-up appointment. Contact information: [address] [place] [address] [phone]         Lucas Mallow, MD. Call today.   Specialty: Urology Why: Please call for a posthospital follow-up appointment and voiding trial in the office. Contact information: [address] [place] [address] [phone]                  The results of significant diagnostics from this hospitalization (including imaging, microbiology, ancillary and laboratory) are listed below for reference.    Significant Diagnostic Studies: CT ABDOMEN PELVIS WO CONTRAST  Result Date: 09/23/2021 CLINICAL DATA:  Abdominal distension. EXAM: CT ABDOMEN AND PELVIS WITHOUT CONTRAST TECHNIQUE: Multidetector CT imaging of the abdomen and pelvis was performed following the standard protocol without IV contrast. COMPARISON:  September 17, 2021. FINDINGS: Lower chest: Small bilateral pleural effusions are noted. Stable left lower lobe opacity is noted concerning for pneumonia. Hepatobiliary: No focal liver abnormality is seen. No gallstones, gallbladder wall thickening, or biliary dilatation. Pancreas: Unremarkable. No pancreatic ductal dilatation or surrounding inflammatory changes. Spleen: Normal in size without focal abnormality. Adrenals/Urinary Tract: Adrenal glands appear normal. Moderate to severe right  hydroureteronephrosis is noted without evidence of obstructing calculus. Moderate right renal cortical atrophy is noted. Transition zone is seen in the distal right ureter in the pelvis concerning for stricture. Left kidney is unremarkable. Mild urinary bladder distention is noted. Stomach/Bowel: Stomach appears normal. There is no evidence of bowel obstruction or inflammation. Vascular/Lymphatic: Aortic atherosclerosis. No enlarged abdominal or pelvic lymph nodes. IVC filter is noted in infrarenal position. Reproductive: Prostate is unremarkable. Other: No abdominal wall hernia or abnormality. No abdominopelvic ascites. Musculoskeletal: No acute or significant osseous findings. IMPRESSION: Small bilateral pleural effusions are noted. Stable left lower lobe opacity is noted concerning for pneumonia. Moderate to severe right hydroureteronephrosis is noted without evidence of obstructing calculus which is increased compared to prior exam. Moderate right renal cortical atrophy is noted suggesting longstanding obstruction. Transition zone is seen in the distal right ureter in the pelvis concerning for stricture. Aortic Atherosclerosis (ICD10-I70.0). Electronically Signed   By: Marijo Conception M.D.   On: 09/23/2021 12:27   DG Abd 1 View  Result Date: 09/23/2021 CLINICAL DATA:  Intubation. EXAM: PORTABLE CHEST 1 VIEW COMPARISON:  Chest radiograph dated 09/16/2021. FINDINGS: Endotracheal tube with tip approximately 3.5 cm above the carina. Enteric tube extends below the diaphragm with tip and side-port in the left upper abdomen likely in the proximal stomach. Diffuse interstitial coarsening and streaky densities which may represent chronic changes. Interval improvement of the previously seen left lung base density. Residual infiltrate is not excluded no pleural effusion pneumothorax. Stable cardiomegaly. Median sternotomy wires. Atherosclerotic calcification of the aorta. Nonspecific bowel gas pattern. Air is noted  within the colon. An IVC filter is noted. Osteopenia with degenerative changes of  the spine and spinal fusion hardware. No acute osseous pathology. IMPRESSION: Endotracheal tube with tip above the carina. Enteric tube extends below the diaphragm with tip and side-port in the proximal stomach. Electronically Signed   By: Anner Crete M.D.   On: 09/23/2021 19:17   CT HEAD WO CONTRAST (5MM)  Result Date: 09/23/2021 CLINICAL DATA:  Delirium EXAM: CT HEAD WITHOUT CONTRAST TECHNIQUE: Contiguous axial images were obtained from the base of the skull through the vertex without intravenous contrast. COMPARISON:  CT head August 24, 2021. FINDINGS: Motion limited study.  Within this limitation: Brain: New hypodensity in the left occipital lobe (series 3, image 12; series 5, image 40). No acute hemorrhage a mass lesion, midline shift, or extra-axial fluid collection. Similar small hypodensity in the right basal ganglia, likely a remote lacunar infarct or dilated perivascular space. Similar atrophy. Vascular: No obvious hyperdense vessel with limited evaluation. Skull: No acute fracture. Sinuses/Orbits: Clear sinuses.  Unremarkable orbits. Other: No mastoid effusions IMPRESSION: Motion limited study with new hypoattenuation in the left occipital lobe. While this could represent artifact, acute infarct is not excluded. If the patient is able, MRI could provide more sensitive evaluation for acute infarct. Electronically Signed   By: Margaretha Sheffield M.D.   On: 09/23/2021 12:31   MR THORACIC SPINE WO CONTRAST  Result Date: 09/17/2021 CLINICAL DATA:  Back pain. History of thoracolumbar fusion. 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 EXAM: MRI THORACIC SPINE WITHOUT CONTRAST TECHNIQUE: Multiplanar, multisequence MR imaging of the thoracic spine was performed. No intravenous contrast was administered. COMPARISON:  CT scan 09/17/2021 and prior thoracic spine MRI 08/30/2020 FINDINGS: Examination is limited  by artifact from the thoracolumbar hardware and patient motion. Alignment: Normal overall alignment. Vertebrae: Normal marrow signal. No bone lesions or fractures. Chronic changes at T10-11 but no findings suspicious for active discitis or osteomyelitis. Cord: Grossly normal cord signal intensity. No obvious cord lesions. Paraspinal and other soft tissues: No significant paraspinal findings. No paraspinal abscess. Disc levels: Postoperative changes with posterior fusion hardware from T10-T12. No obvious complicating features are identified. No obvious spinal or foraminal stenosis in this area. Shallow left paracentral disc protrusion at T8-9 with mild flattening of the thecal sac. No foraminal stenosis. No other thoracic disc protrusions, spinal or foraminal stenosis identified. IMPRESSION: 1. Postoperative changes with posterior fusion hardware from T10-T12. No obvious complicating features are identified. 2. Chronic changes at T10-11 without findings suspicious for recurrent/active discitis/osteomyelitis. 3. Shallow left paracentral disc protrusion at T8-9 with mild flattening of the thecal sac. 4. No other thoracic disc protrusions, spinal or foraminal stenosis. Electronically Signed   By: Marijo Sanes M.D.   On: 09/17/2021 09:33   MR FOOT LEFT WO CONTRAST  Result Date: 09/17/2021 CLINICAL DATA:  Osteomyelitis, foot EXAM: MRI OF THE LEFT FOOT WITHOUT CONTRAST TECHNIQUE: Multiplanar, multisequence MR imaging of the left foot was performed. No intravenous contrast was administered. COMPARISON:  MRI 04/06/2020, radiograph 04/05/2020 FINDINGS: Bones/Joint/Cartilage Unchanged severe arthritis with chronic erosive changes of the distal first and second metatarsals and adjacent proximal phalanges. Unchanged severe arthritis and erosive changes of the third through fifth digits. Unchanged posttraumatic or severe arthritic deformity of the distal fifth metatarsal. There is preserved T1 signal throughout the foot.  There is mild paratracheal bony edema within the second digit phalanges. Unchanged severe arthritis of the tarsometatarsal joints. There is periarticular bony edema throughout the midfoot likely related to arthritis. Ligaments Multiple disrupted MCP and interphalangeal joint ligaments. Muscles and Tendons Diffuse atrophy  in the foot and swelling as is commonly seen in diabetics. Soft tissues Diffuse soft tissue swelling. No focal fluid collection on noncontrast exam. IMPRESSION: Mild periarticular bony edema within the second digit phalanges with preserved T1 marrow signal. This is favored to be related to severe arthritis/reactive marrow change, though if there is an adjacent soft tissue ulcer, early osteomyelitis would be possible. No other evidence of osteomyelitis in the foot. Diffuse soft tissue swelling. No evidence of soft tissue abscess on noncontrast exam. 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 Electronically Signed   By: Maurine Simmering M.D.   On: 09/17/2021 09:12   Portable Chest x-ray  Result Date: 09/23/2021 CLINICAL DATA:  Intubation. EXAM: PORTABLE CHEST 1 VIEW COMPARISON:  Chest radiograph dated 09/16/2021. FINDINGS: Endotracheal tube with tip approximately 3.5 cm above the carina. Enteric tube extends below the diaphragm with tip and side-port in the left upper abdomen likely in the proximal stomach. Diffuse interstitial coarsening and streaky densities which may represent chronic changes. Interval improvement of the previously seen left lung base density. Residual infiltrate is not excluded no pleural effusion pneumothorax. Stable cardiomegaly. Median sternotomy wires. Atherosclerotic calcification of the aorta. Nonspecific bowel gas pattern. Air is noted within the colon. An IVC filter is noted. Osteopenia with degenerative changes of the spine and spinal fusion hardware. No acute osseous pathology. IMPRESSION: Endotracheal tube with tip above the  carina. Enteric tube extends below the diaphragm with tip and side-port in the proximal stomach. Electronically Signed   By: Anner Crete M.D.   On: 09/23/2021 19:17   DG Chest Port 1 View  Result Date: 09/17/2021 CLINICAL DATA:  Shortness of breath EXAM: PORTABLE CHEST 1 VIEW COMPARISON:  08/24/2021 FINDINGS: Left basilar consolidation is new since the prior study. Mild cardiomegaly. Shallow lung inflation. IMPRESSION: Left basilar consolidation, likely pneumonia. Electronically Signed   By: Ulyses Jarred M.D.   On: 09/17/2021 00:01   DG Abd Portable 1V  Result Date: 09/21/2021 CLINICAL DATA:  Nausea and vomiting, abdominal pain. EXAM: PORTABLE ABDOMEN - 1 VIEW COMPARISON:  Plain film of the abdomen dated 09/15/2020. FINDINGS: Visualized bowel gas pattern is nonobstructive. No evidence of soft tissue mass or abnormal fluid collection. No evidence of free intraperitoneal air. There is a dense opacity at the LEFT lung base, similar to chest x-ray of 09/16/2021, suspected pneumonia. IMPRESSION: 1. Nonobstructive bowel gas pattern and no evidence of acute intra-abdominal abnormality. 2. Probable LEFT lower lobe pneumonia. Electronically Signed   By: Franki Cabot M.D.   On: 09/21/2021 14:22   ECHOCARDIOGRAM COMPLETE  Result Date: 09/24/2021    ECHOCARDIOGRAM REPORT   Patient Name:   TORELL MINDER Date of Exam: 09/24/2021 Medical Rec #:  [id number]       Height:       78.0 in Accession #:    [id number]      Weight:       261.5 lb Date of Birth:  06/17/1951       BSA:          2.530 m Patient Age:    51 years        BP:           172/105 mmHg Patient Gender: M               HR:           94 bpm. Exam Location:  Inpatient Procedure: 2D Echo, Color Doppler and Cardiac Doppler Indications:  R06.9 DOE  History:        Patient has prior history of Echocardiogram examinations, most                 recent 06/17/2021. CHF; Risk Factors:Hypertension and Diabetes.                 Prior performed at [facility].  Sonographer:    [place] Referring Phys: [place] Comments: See sonographer comments IMPRESSIONS  1. Left ventricular ejection fraction, by estimation, is 35 to 40%. The left ventricle has moderately decreased function. The left ventricle demonstrates global hypokinesis. There is moderate concentric left ventricular hypertrophy. Left ventricular diastolic parameters are indeterminate.  2. Right ventricular systolic function is normal. The right ventricular size is moderately enlarged. There is severely elevated pulmonary artery systolic pressure.  3. Left atrial size was moderately dilated.  4. Right atrial size was mildly dilated.  5. The mitral valve is abnormal. Moderate mitral valve regurgitation.  6. Tricuspid valve regurgitation is mild to moderate.  7. The aortic valve is tricuspid. There is mild calcification of the aortic valve. Aortic valve regurgitation is not visualized.  8. Aortic dilatation noted. There is mild dilatation of the ascending aorta, measuring 42 mm.  9. The inferior vena cava is dilated in size with >50% respiratory variability, suggesting right atrial pressure of 8 mmHg. Comparison(s): No prior Echocardiogram. FINDINGS  Left Ventricle: Left ventricular ejection fraction, by estimation, is 35 to 40%. The left ventricle has moderately decreased function. The left ventricle demonstrates global hypokinesis. The left ventricular internal cavity size was normal in size. There is moderate concentric left ventricular hypertrophy. Left ventricular diastolic parameters are indeterminate. Right Ventricle: The right ventricular size is moderately enlarged. No increase in right ventricular wall thickness. Right ventricular systolic function is normal. There is severely elevated pulmonary artery systolic pressure. The tricuspid regurgitant velocity is 3.73 m/s, and with an assumed right atrial pressure of 8 mmHg, the estimated right ventricular systolic  pressure is 09.3 mmHg. Left Atrium: Left atrial size was moderately dilated. Right Atrium: Right atrial size was mildly dilated. Pericardium: There is no evidence of pericardial effusion. Mitral Valve: The mitral valve is abnormal. Moderate mitral valve regurgitation. Tricuspid Valve: The tricuspid valve is normal in structure. Tricuspid valve regurgitation is mild to moderate. No evidence of tricuspid stenosis. Aortic Valve: The aortic valve is tricuspid. There is mild calcification of the aortic valve. Aortic valve regurgitation is not visualized. Pulmonic Valve: The pulmonic valve was not well visualized. Pulmonic valve regurgitation is not visualized. No evidence of pulmonic stenosis. Aorta: Aortic dilatation noted. There is mild dilatation of the ascending aorta, measuring 42 mm. Venous: The inferior vena cava is dilated in size with greater than 50% respiratory variability, suggesting right atrial pressure of 8 mmHg. IAS/Shunts: The atrial septum is grossly normal.  LEFT VENTRICLE PLAX 2D LVIDd:         5.00 cm LVIDs:         3.90 cm LV PW:         1.40 cm LV IVS:        1.40 cm LVOT diam:     2.20 cm LV SV:         52 LV SV Index:   20 LVOT Area:     3.80 cm  LV Volumes (MOD) LV vol d, MOD A2C: 141.0 ml LV vol d, MOD A4C: 165.0 ml LV vol s, MOD A2C: 94.4 ml  LV vol s, MOD A4C: 101.0 ml LV SV MOD A2C:     46.6 ml LV SV MOD A4C:     165.0 ml LV SV MOD BP:      55.4 ml RIGHT VENTRICLE RV S prime:     11.60 cm/s TAPSE (M-mode): 1.7 cm LEFT ATRIUM              Index        RIGHT ATRIUM           Index LA diam:        4.30 cm  1.70 cm/m   RA Area:     25.20 cm LA Vol (A2C):   106.0 ml 41.90 ml/m  RA Volume:   88.50 ml  34.98 ml/m LA Vol (A4C):   117.0 ml 46.25 ml/m LA Biplane Vol: 115.0 ml 45.46 ml/m  AORTIC VALVE LVOT Vmax:   69.20 cm/s LVOT Vmean:  52.300 cm/s LVOT VTI:    0.136 m  AORTA Ao Root diam: 3.60 cm Ao Asc diam:  4.20 cm TRICUSPID VALVE TR Peak grad:   55.7 mmHg TR Vmax:        373.00 cm/s  SHUNTS  Systemic VTI:  0.14 m Systemic Diam: 2.20 cm Rudean Haskell MD Electronically signed by Rudean Haskell MD Signature Date/Time: 09/24/2021/4:12:44 PM    Final    CT Renal Stone Study  Result Date: 09/17/2021 CLINICAL DATA:  Acute kidney injury EXAM: CT ABDOMEN AND PELVIS WITHOUT CONTRAST TECHNIQUE: Multidetector CT imaging of the abdomen and pelvis was performed following the standard protocol without IV contrast. COMPARISON:  08/24/2021 FINDINGS: LOWER CHEST: Left basilar consolidation. HEPATOBILIARY: Normal hepatic contours. No intra- or extrahepatic biliary dilatation. The gallbladder is normal. PANCREAS: Normal pancreas. No ductal dilatation or peripancreatic fluid collection. SPLEEN: Normal. ADRENALS/URINARY TRACT: The adrenal glands are normal. The right kidney is atrophic. There is moderate right hydroureteronephrosis. The urinary bladder is normal for degree of distention STOMACH/BOWEL: There is no hiatal hernia. Normal duodenal course and caliber. No small bowel dilatation or inflammation. No focal colonic abnormality. Normal appendix. VASCULAR/LYMPHATIC: There is calcific aortic atherosclerosis. There is an infrarenal IVC filter. REPRODUCTIVE: Normal prostate size with symmetric seminal vesicles. MUSCULOSKELETAL. No bony spinal canal stenosis or focal osseous abnormality. OTHER: None. IMPRESSION: 1. Moderate right hydroureteronephrosis without obstructing stone or mass. 2. Atrophic right kidney. 3. Left basilar consolidation, concerning for pneumonia. Aortic Atherosclerosis (ICD10-I70.0). Electronically Signed   By: Ulyses Jarred M.D.   On: 09/17/2021 01:39   VAS [place] LOWER EXTREMITY VENOUS (DVT)  Result Date: 09/17/2021  Lower Venous DVT Study Patient Name:  LORETTA KLUENDER  Date of Exam:   09/17/2021 Medical Rec #: [id number]        Accession #:    [id number] Date of Birth: 1951-03-01        Patient Gender: M Patient Age:   33 years Exam Location:  [facility] Procedure:      VAS  [place] LOWER EXTREMITY VENOUS (DVT) Referring Phys: Gean Birchwood --------------------------------------------------------------------------------  Indications: Ulceration, Pain, Edema, and Erythema.  Limitations: Body habitus and poor ultrasound/tissue interface. Comparison Study: No previous exams Performing Technologist: Jody Hill RVT, RDMS  Examination Guidelines: A complete evaluation includes B-mode imaging, spectral Doppler, color Doppler, and power Doppler as needed of all accessible portions of each vessel. Bilateral testing is considered an integral part of a complete examination. Limited examinations for reoccurring indications may be performed as noted. The reflux portion of the exam is performed with the  patient in reverse Trendelenburg.  +-----+---------------+---------+-----------+----------+--------------+ RIGHTCompressibilityPhasicitySpontaneityPropertiesThrombus Aging +-----+---------------+---------+-----------+----------+--------------+ CFV  Full           Yes      Yes                                 +-----+---------------+---------+-----------+----------+--------------+   +---------+---------------+---------+-----------+----------+-------------------+ LEFT     CompressibilityPhasicitySpontaneityPropertiesThrombus Aging      +---------+---------------+---------+-----------+----------+-------------------+ CFV      Full           Yes      Yes                                      +---------+---------------+---------+-----------+----------+-------------------+ SFJ      Full                                                             +---------+---------------+---------+-----------+----------+-------------------+ FV Prox  Full           Yes      Yes                                      +---------+---------------+---------+-----------+----------+-------------------+ FV Mid   Partial        No       No                   Age Indeterminate    +---------+---------------+---------+-----------+----------+-------------------+ FV DistalPartial        Yes      Yes                  Age Indeterminate   +---------+---------------+---------+-----------+----------+-------------------+ PFV      Full                                                             +---------+---------------+---------+-----------+----------+-------------------+ POP      Full           Yes      Yes                                      +---------+---------------+---------+-----------+----------+-------------------+ PTV                     Yes      Yes                  Not well visualized +---------+---------------+---------+-----------+----------+-------------------+ PERO                    Yes      Yes                  Not well visualized +---------+---------------+---------+-----------+----------+-------------------+ Only one of paired veins seen in both posterior tibial & peroneal veins. Patent by color and doppler.    Summary: RIGHT: -  No evidence of common femoral vein obstruction.  LEFT: - Findings consistent with age indeterminate deep vein thrombosis involving the left femoral vein. - There is no evidence of superficial venous thrombosis.  - No cystic structure found in the popliteal fossa. Subcutaneous edema noted throughout.  *See table(s) above for measurements and observations. Electronically signed by Harold Barban MD on 09/17/2021 at 7:01:35 PM.    Final     Microbiology: Recent Results (from the past 240 hour(s))  MRSA Next Gen by PCR, Nasal     Status: Abnormal   Collection Time: 09/21/21  6:16 AM   Specimen: Nasal Mucosa; Nasal Swab  Result Value Ref Range Status   MRSA by PCR Next Gen DETECTED (A) NOT DETECTED Final    Comment: RESULT CALLED TO, READ BACK BY AND VERIFIED WITH: HUGHUY, S. RN ON 09/21/2021 @ 0941 BY MECIAL J. (NOTE) The GeneXpert MRSA Assay (FDA approved for NASAL specimens only), is one component of a  comprehensive MRSA colonization surveillance program. It is not intended to diagnose MRSA infection nor to guide or monitor treatment for MRSA infections. Test performance is not FDA approved in patients less than 14 years old. Performed at [facility], [place] [address] [place], [place] [address]      Labs: Basic Metabolic Panel: Recent Labs  Lab 09/23/21 1920 09/24/21 0303 09/25/21 0321 09/26/21 0530 09/27/21 4174 09/28/21 0551 09/29/21 0601 09/30/21 0516  NA  --   --    < > 141 139 139 139 136  K  --   --    < > 4.5 4.1 4.7 4.4 4.6  CL  --   --    < > 110 110 110 107 105  CO2  --   --    < > 22 23 20* 23 23  GLUCOSE  --   --    < > 107* 99 119* 111* 128*  BUN  --   --    < > 36* 34* 35* 35* 35*  CREATININE  --   --    < > 3.08* 3.07* 3.03* 2.76* 2.87*  CALCIUM  --   --    < > 7.1* 7.2* 7.5* 7.5* 7.5*  MG 2.0 2.0  --   --   --   --   --   --   PHOS 4.4 5.3*  --   --   --   --   --   --    < > = values in this interval not displayed.   Liver Function Tests: Recent Labs  Lab 09/25/21 0321  AST 31  ALT 13  ALKPHOS 73  BILITOT 0.7  PROT 7.0  ALBUMIN 2.9*   No results for input(s): LIPASE, AMYLASE in the last 168 hours. No results for input(s): AMMONIA in the last 168 hours. CBC: Recent Labs  Lab 09/26/21 0530 09/27/21 0611 09/28/21 0551 09/29/21 0601 09/30/21 0516  WBC 6.2 7.1 7.3 7.2 7.2  NEUTROABS 3.9 4.6 4.9 4.8 4.8  HGB 9.8* 8.9* 9.6* 9.1* 9.3*  HCT 31.9* 29.4* 31.1* 29.5* 29.9*  MCV 102.9* 102.1* 100.6* 99.7 100.7*  PLT 256 243 245 252 241   Cardiac Enzymes: No results for input(s): CKTOTAL, CKMB, CKMBINDEX, TROPONINI in the last 168 hours. BNP: BNP (last 3 results) Recent Labs    09/16/21 2245  BNP 97.5    ProBNP (last 3 results) No results for input(s): PROBNP in the last 8760 hours.  CBG: Recent Labs  Lab 09/29/21  2107 09/29/21 2332 09/30/21 0412 09/30/21 0753 09/30/21 1203  GLUCAP 165* 121* 123* 104* 126*        Signed:  Kayleen Memos, MD [facility] 09/30/2021, 4:32 PM

## 2021-09-30 NOTE — Progress Notes (Signed)
Offered pt Ducolax suppository as he requested. Pt now wishes to wait until after he eats and closer to bed. Medication time moved to 2000

## 2021-10-01 ENCOUNTER — Other Ambulatory Visit (HOSPITAL_COMMUNITY): Payer: Self-pay

## 2021-10-01 LAB — CBC WITH DIFFERENTIAL/PLATELET
Abs Immature Granulocytes: 0.06 10*3/uL (ref 0.00–0.07)
Basophils Absolute: 0.1 10*3/uL (ref 0.0–0.1)
Basophils Relative: 1 %
Eosinophils Absolute: 0.3 10*3/uL (ref 0.0–0.5)
Eosinophils Relative: 5 %
HCT: 29.3 % — ABNORMAL LOW (ref 39.0–52.0)
Hemoglobin: 9.2 g/dL — ABNORMAL LOW (ref 13.0–17.0)
Immature Granulocytes: 1 %
Lymphocytes Relative: 27 %
Lymphs Abs: 1.8 10*3/uL (ref 0.7–4.0)
MCH: 32.3 pg (ref 26.0–34.0)
MCHC: 31.4 g/dL (ref 30.0–36.0)
MCV: 102.8 fL — ABNORMAL HIGH (ref 80.0–100.0)
Monocytes Absolute: 0.7 10*3/uL (ref 0.1–1.0)
Monocytes Relative: 11 %
Neutro Abs: 3.8 10*3/uL (ref 1.7–7.7)
Neutrophils Relative %: 55 %
Platelets: 258 10*3/uL (ref 150–400)
RBC: 2.85 MIL/uL — ABNORMAL LOW (ref 4.22–5.81)
RDW: 15.1 % (ref 11.5–15.5)
WBC: 6.7 10*3/uL (ref 4.0–10.5)
nRBC: 0 % (ref 0.0–0.2)

## 2021-10-01 LAB — GLUCOSE, CAPILLARY
Glucose-Capillary: 124 mg/dL — ABNORMAL HIGH (ref 70–99)
Glucose-Capillary: 146 mg/dL — ABNORMAL HIGH (ref 70–99)
Glucose-Capillary: 170 mg/dL — ABNORMAL HIGH (ref 70–99)
Glucose-Capillary: 174 mg/dL — ABNORMAL HIGH (ref 70–99)
Glucose-Capillary: 208 mg/dL — ABNORMAL HIGH (ref 70–99)

## 2021-10-01 MED ORDER — SACCHAROMYCES BOULARDII 250 MG PO CAPS
250.0000 mg | ORAL_CAPSULE | Freq: Two times a day (BID) | ORAL | 0 refills | Status: DC
  Filled 2021-10-01: qty 60, 30d supply, fill #0

## 2021-10-01 MED ORDER — POLYETHYLENE GLYCOL 3350 17 G PO PACK
17.0000 g | PACK | Freq: Every day | ORAL | 0 refills | Status: DC | PRN
  Filled 2021-10-01: qty 14, 14d supply, fill #0

## 2021-10-01 MED ORDER — CLONIDINE 0.3 MG/24HR TD PTWK
0.3000 mg | MEDICATED_PATCH | TRANSDERMAL | 0 refills | Status: DC
Start: 2021-10-01 — End: 2021-10-22
  Filled 2021-10-01: qty 4, 28d supply, fill #0

## 2021-10-01 MED ORDER — FERROUS SULFATE 325 (65 FE) MG PO TABS
325.0000 mg | ORAL_TABLET | Freq: Every day | ORAL | 0 refills | Status: DC
  Filled 2021-10-01: qty 30, 30d supply, fill #0

## 2021-10-01 MED ORDER — PANTOPRAZOLE SODIUM 40 MG PO TBEC
40.0000 mg | DELAYED_RELEASE_TABLET | Freq: Two times a day (BID) | ORAL | 0 refills | Status: DC
  Filled 2021-10-01: qty 60, 30d supply, fill #0

## 2021-10-01 MED ORDER — NALOXEGOL OXALATE 12.5 MG PO TABS
12.5000 mg | ORAL_TABLET | Freq: Every day | ORAL | 0 refills | Status: AC
  Filled 2021-10-01: qty 30, 30d supply, fill #0

## 2021-10-01 MED ORDER — OXYCODONE HCL 5 MG PO TABS
5.0000 mg | ORAL_TABLET | Freq: Two times a day (BID) | ORAL | 0 refills | Status: AC | PRN
  Filled 2021-10-01: qty 6, 3d supply, fill #0

## 2021-10-01 MED ORDER — HYDRALAZINE HCL 100 MG PO TABS
100.0000 mg | ORAL_TABLET | Freq: Three times a day (TID) | ORAL | 0 refills | Status: DC
  Filled 2021-10-01: qty 90, 30d supply, fill #0

## 2021-10-01 MED ORDER — APIXABAN 5 MG PO TABS
5.0000 mg | ORAL_TABLET | Freq: Two times a day (BID) | ORAL | 0 refills | Status: DC
  Filled 2021-10-01: qty 180, 90d supply, fill #0

## 2021-10-01 MED ORDER — CYANOCOBALAMIN 100 MCG PO TABS
200.0000 ug | ORAL_TABLET | Freq: Every day | ORAL | 0 refills | Status: DC
  Filled 2021-10-01: qty 60, 30d supply, fill #0

## 2021-10-01 MED ORDER — METOPROLOL TARTRATE 50 MG PO TABS
50.0000 mg | ORAL_TABLET | Freq: Two times a day (BID) | ORAL | 0 refills | Status: DC
  Filled 2021-10-01: qty 60, 30d supply, fill #0

## 2021-10-01 NOTE — Progress Notes (Signed)
Subjective/Chief Complaint:  1 - Acute on Chronic Renal Failure / Urinary Retention - catheter placed 12/6 for retention during admission for FTT. Baselien Cr 2.5's, had risen to 4s, return to baseline after catheter.  2 - Atrophic Rt Kidney / Chronic Hydronephrosis - Rt kidney with minimal parenchyma and very chronic appearing hydro to distal ureter by CT 09/2021. [place] after foley unchanged (typical given suspected chronicity).  Today "Thomas [name]" is stable. GFR back to baseline with foley. His Rt hydro is persistent which is not surprising as his Rt kidney is quite atrophic.    Objective: Vital signs in last 24 hours: Temp:  [98 F (36.7 C)-98.5 F (36.9 C)] 98 F (36.7 C) (12/13 0358) Pulse Rate:  [70-79] 70 (12/13 0358) Resp:  [20] 20 (12/13 0358) BP: (124-144)/(71-91) 144/91 (12/13 0358) SpO2:  [91 %-93 %] 93 % (12/13 0358) Weight:  [122.9 kg-123.5 kg] 122.9 kg (12/13 0600) Last BM Date: 09/28/21  Intake/Output from previous day: 12/12 0701 - 12/13 0700 In: 350 [P.O.:350] Out: 2900 [Urine:2900] Intake/Output this shift: No intake/output data recorded.  NAD, pleasant, AOx3 Non-labored breathing on RA RRR Moderate truncal obesity Foley in place with clear urine, no phimosis / paraphimosis S/p leg amputation  Lab Results:  Recent Labs    09/30/21 0516 10/01/21 0512  WBC 7.2 6.7  HGB 9.3* 9.2*  HCT 29.9* 29.3*  PLT 241 258   BMET Recent Labs    09/29/21 0601 09/30/21 0516  NA 139 136  K 4.4 4.6  CL 107 105  CO2 23 23  GLUCOSE 111* 128*  BUN 35* 35*  CREATININE 2.76* 2.87*  CALCIUM 7.5* 7.5*   PT/INR No results for input(s): LABPROT, INR in the last 72 hours. ABG No results for input(s): PHART, HCO3 in the last 72 hours.  Invalid input(s): PCO2, PO2  Studies/Results: US RENAL  Result Date: 09/30/2021 CLINICAL DATA:  Hydronephrosis EXAM: RENAL / URINARY TRACT ULTRASOUND COMPLETE COMPARISON:  None. FINDINGS: Right Kidney: Renal measurements: 11.2 x  5.2 x 6.0 cm = volume: 182 mL. Echogenicity within normal limits. Severe hydronephrosis and proximal hydroureter. No calculi or other obstruction identified. Left Kidney: Renal measurements: 13.1 x 6.3 x 6.7 cm = volume: 288 mL. Echogenicity within normal limits. No mass or hydronephrosis visualized. Bladder: Decompressed by Foley catheter. Other: None. IMPRESSION: 1. Severe right hydronephrosis and proximal hydroureter, as seen by prior CT. No calculi or other obstruction identified. 2. No left hydronephrosis. 3. Bladder decompressed by Foley catheter. Electronically Signed   By: Delanna Ahmadi M.D.   On: 09/30/2021 16:49    Anti-infectives: Anti-infectives (From admission, onward)    Start     Dose/Rate Route Frequency Ordered Stop   09/23/21 0600  cefTRIAXone (ROCEPHIN) 1 g in sodium chloride 0.9 % 100 mL IVPB  Status:  Discontinued        1 g 200 mL/hr over 30 Minutes Intravenous Every 24 hours 09/23/21 0508 09/23/21 0914   09/21/21 2200  cephALEXin (KEFLEX) capsule 500 mg  Status:  Discontinued        500 mg Oral Every 8 hours 09/21/21 0744 09/22/21 0559   09/21/21 1115  doxycycline (VIBRA-TABS) tablet 100 mg  Status:  Discontinued        100 mg Oral Every 12 hours 09/21/21 1027 09/22/21 0559   09/21/21 0000  cephALEXin (KEFLEX) 500 MG capsule  Status:  Discontinued        500 mg Oral Every 8 hours 09/21/21 0745 09/21/21    09/21/21  0000  doxycycline (VIBRA-TABS) 100 MG tablet        100 mg Oral Every 12 hours 09/21/21 1029 09/26/21 2359   09/21/21 0000  cephALEXin (KEFLEX) 500 MG capsule        500 mg Oral Every 8 hours 09/21/21 1029 09/26/21 2359   09/19/21 1100  linezolid (ZYVOX) IVPB 600 mg  Status:  Discontinued        600 mg 300 mL/hr over 60 Minutes Intravenous Every 12 hours 09/19/21 1005 09/21/21 1028   09/17/21 2200  cefTRIAXone (ROCEPHIN) 2 g in sodium chloride 0.9 % 100 mL IVPB  Status:  Discontinued        2 g 200 mL/hr over 30 Minutes Intravenous Every 24 hours 09/17/21 0349  09/21/21 0748   09/17/21 0512  sodium chloride 0.9 % with azithromycin (ZITHROMAX) ADS Med       Note to Pharmacy: Andre Lefort: cabinet override      09/17/21 0512 09/17/21 1714   09/17/21 0400  azithromycin (ZITHROMAX) 500 mg in sodium chloride 0.9 % 250 mL IVPB        500 mg 250 mL/hr over 60 Minutes Intravenous Every 24 hours 09/17/21 0349 09/21/21 0458   09/17/21 0312  vancomycin variable dose per unstable renal function (pharmacist dosing)  Status:  Discontinued         Does not apply See admin instructions 09/17/21 0312 09/19/21 1003   09/17/21 0000  vancomycin (VANCOREADY) IVPB 2000 mg/400 mL        2,000 mg 200 mL/hr over 120 Minutes Intravenous  Once 09/16/21 2345 09/17/21 0508   09/16/21 2347  sodium chloride 0.9 % with cefTRIAXone (ROCEPHIN) ADS Med       Note to Pharmacy: Angelia Mould L: cabinet override      09/16/21 2347 09/17/21 1159   09/16/21 2330  cefTRIAXone (ROCEPHIN) 2 g in sodium chloride 0.9 % 100 mL IVPB        2 g 200 mL/hr over 30 Minutes Intravenous  Once 09/16/21 2328 09/17/21 0352       Assessment/Plan:  OK for DC with catheter and FU as planned with Dr. Gloriann Loan in office. His persistant Rt hydro is not acutely worriseom as this appears to be very very chronic in nature and likely permant. AS his rt kidney has minimal funcitonal parenchyma, I do not feel that additional drainage (neph tube or stent) would have good risk / benefit profile.    Alexis Frock 10/01/2021

## 2021-10-01 NOTE — Progress Notes (Signed)
Occupational Therapy Treatment Patient Details Name: Thomas [name] MRN: [id number] DOB: 04-24-1951 Today's Date: 10/01/2021   History of present illness Thomas [name] is an 70 y.o. male past medical history of DM type 2, HTN, CKD stage IV, chronic normocytic anemia, brought into the ED from home via EMS due to concern by his brother Thomas [name] not looking well, not eating or taking his medications for several days.  Upon EMS arrival, his blood glucose was 57 was given D10.  Work-up revealed AKI on CKD 4, suspected prerenal secondary to dehydration.  Also revealed left lower lobe community-acquired pneumonia, left lower extremity cellulitis and age-indeterminate left lower extremity DVT.  MRI left foot could not rule out second digit osteomyelitis. He received empiric IV antibiotics x 6 days.  MRI thoracic spine 09/17/2021 did not show evidence of discitis. Hospital course complicated by intractable nausea and vomiting for which GI was consulted.  Abdominal x-ray was unrevealing.  Planned for EGD, but canceled due to severe delirium.  PCCM consulted on 12/5 due to significant worsening of delirium and combativeness with intermittent unresponsiveness, and hypertensive crisis.  Patient was intubated on 12/5 and extubated on 12/6.  Patient was further stabilized in the ICU and [facility] hospitalist assumed care on 09/25/2021.   OT comments  Pt progressing towards acute OT goals. Focus of session was simulated toilet transfer. Pt took pivotal steps from EOB to recliner; up to mod A. Of note, pt joking with OT once in front of recliner, took hands off rw and had LOB requiring mod A to control descent into recliner. Discussed fall prevention and safety with transfers. Pt happy to be out of bed and in recliner at end of session. D/c recommendation remains appropriate.    Recommendations for follow up therapy are one component of a multi-disciplinary discharge planning process, led by the attending physician.   Recommendations may be updated based on patient status, additional functional criteria and insurance authorization.    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) , Lt foot wounds Restrictions Weight Bearing Restrictions: No Other Position/Activity Restrictions: prosthesis and shoe in rroom       Mobility Bed Mobility Overal bed mobility: Needs Assistance Bed Mobility: Supine to Sit     Supine to sit: Min guard;HOB elevated          Transfers Overall transfer level: Needs assistance Equipment used: Rolling walker (2 wheels) Transfers: Sit to/from Stand;Bed to chair/wheelchair/BSC Sit to Stand: Mod assist;From elevated surface   Step pivot transfers: Mod assist       General transfer comment: pt was able to rise from elevated by pulling on walker and tendancy to use forward momentum.     Balance Overall balance assessment: Needs assistance Sitting-balance support: No upper extremity supported;Feet supported Sitting balance-[name]: Fair     Standing balance support: During functional activity;Bilateral upper extremity supported;Reliant on assistive device for balance Standing balance-[name]: Poor                             ADL either performed or assessed with clinical judgement   ADL Overall ADL's : Needs assistance/impaired  Toilet Transfer: Moderate assistance;Stand-pivot;Minimal assistance;BSC/3in1;Rolling walker (2 wheels) Toilet Transfer Details (indicate cue type and reason): simulated with EOB to recliner. Stabilized rw for pt to pull up on. Utilized momentum, succussful on  third attempt to stand           General ADL Comments: bed mobility and stand pivot transfer to sit up in recliner a bit (simulating BSC)    Extremity/Trunk Assessment Upper Extremity Assessment Upper Extremity Assessment: Generalized weakness   Lower Extremity Assessment Lower Extremity Assessment: Defer to PT evaluation        Vision       Perception     Praxis      Cognition Arousal/Alertness: Awake/alert Behavior During Therapy: Flat affect Overall Cognitive Status: No family/caregiver present to determine baseline cognitive functioning                                 General Comments: verbal perseveration noted          Exercises     Shoulder Instructions       General Comments      Pertinent Vitals/ Pain       Pain Assessment: 0-10 Pain Score: 8  Pain Location: back Pain Descriptors / Indicators: Discomfort;Aching Pain Intervention(s): Monitored during session;Limited activity within patient's tolerance;Repositioned;Other (comment) (pt reports he recently asked for pain med)  Home Living                                          Prior Functioning/Environment              Frequency  Min 2X/week        Progress Toward Goals  OT Goals(current goals can now be found in the care plan section)  Progress towards OT goals: Progressing toward goals  Acute Rehab OT Goals Patient Stated Goal: "not be in the hospital" OT Goal Formulation: With patient Time For Goal Achievement: 10/15/21 Potential to Achieve Goals: Fair ADL Goals Pt Will Perform Upper Body Bathing: with modified independence;sitting Pt Will Perform Lower Body Dressing: with modified independence;sit to/from stand;sitting/lateral leans Pt Will Transfer to Toilet: with modified independence;ambulating;regular height toilet Pt Will Perform Toileting - Clothing Manipulation and hygiene: with modified independence;sitting/lateral leans;sit to/from  stand  Plan Discharge plan remains appropriate    Co-evaluation                 AM-PAC OT "6 Clicks" Daily Activity     Outcome Measure   Help from another person eating meals?: A Little Help from another person taking care of personal grooming?: A Little Help from another person toileting, which includes using toliet, bedpan, or urinal?: A Lot Help from another person bathing (including washing, rinsing, drying)?: A Lot Help from another person to put on and taking off regular upper body clothing?: A Little Help from another person to put on and taking off regular lower body clothing?: A Lot 6 Click Score: 15    End of Session Equipment Utilized During Treatment: Rolling walker (2 wheels)  OT Visit Diagnosis: Unsteadiness on feet (R26.81);Muscle weakness (generalized) (M62.81)   Activity Tolerance Patient limited by fatigue;Patient limited by pain;Patient tolerated treatment well   Patient Left in chair;with call bell/phone within reach;with chair alarm set   Nurse Communication          Time:  7308-5694 OT Time Calculation (min): 40 min  Charges: OT General Charges $OT Visit: 1 Visit OT Treatments $Self Care/Home Management : 23-37 mins  Tyrone Schimke, OT Acute Rehabilitation Services Office: [phone]   Hortencia Pilar 10/01/2021, 2:18 PM

## 2021-10-01 NOTE — Progress Notes (Signed)
Pt was to go home with foley in place, foley was removed by tech accidentally. Dr. Tresa Moore paged and consulted, this nurse was to replace foley then continue with pt's discharge. This nurse informed pt on need to replace foley, pt refused. This nurse educated pt on the importance of foley and informed pt that without it his kidney's would likely fail. Pt continued to refuse and signed AMA paperwork before being discharged. Dr. Tresa Moore and Dr. Nevada Crane are both aware.

## 2021-10-01 NOTE — Discharge Summary (Addendum)
Discharge Summary  Thomas [name] RUE:[id number] DOB: 1951-03-25  PCP: Antonietta Jewel, MD  Admit date: 09/16/2021 Discharge date: 10/01/2021  Time spent: 35 minutes  Recommendations for Outpatient Follow-up:  Follow-up with urology Follow-up with your primary care provider Follow-up with cardiology, Dr. Lenell Antu at [facility] Follow-up with infectious disease Take your medications as prescribed Continue PT OT with assistance and fall precautions   Discharge Diagnoses:  Active Hospital Problems   Diagnosis Date Noted   ARF (acute renal failure) ([place]) 09/17/2021   CAP (community acquired pneumonia) 09/17/2021   AKI (acute kidney injury) ([place]) 06/02/2021   BPH (benign prostatic hyperplasia) 11/24/2019   Chronic pain syndrome 11/24/2019   [name], type 2) ([place]) 09/29/2018   HTN (hypertension) 09/29/2018   [name], right ([place]) 09/29/2018    Resolved Hospital Problems  No resolved problems to display.    Discharge Condition: Stable  Diet recommendation: Resume previous diet.  Vitals:   10/01/21 0358 10/01/21 1153  BP: (!) 144/91 (!) 156/84  Pulse: 70 77  Resp: 20   Temp: 98 F (36.7 C) 98.1 F (36.7 C)  SpO2: 93% 98%    History of present illness:   Thomas [name] is an 70 y.o. male past medical history of DM type 2, HTN, CKD stage IV, chronic normocytic anemia, brought into the ED from home via EMS due to concern by his brother BJ not looking well, not eating or taking his medications for several days.  Upon EMS arrival, his blood glucose was 57 was given D10.  Work-up revealed AKI on CKD 4, suspected prerenal secondary to dehydration.  Also revealed left lower lobe community-acquired pneumonia, left lower extremity cellulitis and age-indeterminate left lower extremity DVT.  MRI left foot could not rule out second digit osteomyelitis. He received empiric IV antibiotics x 6 days.  MRI thoracic spine 09/17/2021 did not show evidence of  discitis. Hospital course complicated by intractable nausea and vomiting for which GI was consulted.  Abdominal x-ray was unrevealing.  Planned for EGD, but canceled due to severe delirium.  PCCM consulted on 12/5 due to significant worsening of delirium and combativeness with intermittent unresponsiveness, and hypertensive crisis.  Patient was intubated on 12/5 and extubated on 12/6.  Patient was further stabilized in the ICU and [facility] hospitalist assumed care on 09/25/2021.  Repeated renal ultrasound 09/30/21 revealed:  1. Severe right hydronephrosis and proximal hydroureter, as seen by prior CT. No calculi or other obstruction identified. 2. No left hydronephrosis. 3. Bladder decompressed by Foley catheter. Discussed findings with urology, Dr. Tresa Moore, no planned surgical intervention at this time.  Will follow-up with Dr. Gloriann Loan outpatient.   10/01/2021: Patient was seen and examined at his bedside this morning.  There were no acute events overnight.  He has no new complaints.  He has declined SNF.  He wants to go home.  Will provide home health services and social services follow-up.  Hospital Course:  Principal Problem:   ARF (acute renal failure) (HCC) Active Problems:   [name], right ([place])   [name], type 2) ([place])   HTN (hypertension)   BPH (benign prostatic hyperplasia)   Chronic pain syndrome   AKI (acute kidney injury) ([place])   CAP (community acquired pneumonia)  Resolved delirium/Acute metabolic encephalopathy Noted for hypertensive emergency/PRES Resolved Required intubation on 09/23/2021, extubated on 09/24/2021.  He was weaned to room air. Back to his baseline mentation.   Hypertensive emergency-resolved BP  stable. Continue clonidine patch, hydralazine, amlodipine, metoprolol Follow-up with your PCP.   Resolved acute respiratory failure with hypoxemia due to inability to protect airway Required intubation on 12/5, extubated on 12/6 Weaned to RA O2  saturation 98% on room air.  Anemia of chronic renal disease/iron deficiency anemia: Iron deficiency noted on iron studies. Hemoglobin 9.2 from 1.3. No overt bleeding. Continue iron supplement. Follow-up with your PCP.   Nonoliguric acute kidney injury on chronic kidney disease stage IV Baseline creatinine around 2.4, on admission 4.62 Creatinine 2.87 on 09/30/2021. Continue to avoid nephrotoxic agents. Follow-up with your PCP.   Chronic systolic CHF Echo done 63/7/85 showed EF of 35 to 40%, left ventricle global hypokinesis, severely elevated pulmonary artery systolic pressure Euvolemic on exam. Follow-up with cardiology.   Left lower extremity age-indeterminate DVT, seen on Doppler ultrasound 09/17/2021, POA Due to significant delirium and possibility of EGD, patient was started on heparin drip on 09/22/2021, discontinued  Continue Eliquis for 3 to 6 months Follow-up with your PCP.   Treated LLL CAP, POA Completed 6 days of Rocephin and 5 days of azithromycin   Hypocalcemia Replaced   Severe R hydroureteronephrosis seen on CT scan No plan for surgical intervention per Urology. Continue Foley catheter, plan for voiding trial at Dr. Purvis Sheffield urologist office outpatient. Continue Flomax, finasteride   Treated left lower extremity cellulitis, POA Received 6 days of empiric IV antibiotics MRI of the left foot, osteomyelitis of second digit could not be completely ruled out, unlikely this is osteomyelitis per podiatry, Dr. Posey Pronto.  Seen by podiatry Dr. Posey Pronto, plan to follow-up outpatient.  No evidence of infection or osteomyelitis to the second digits.  No local wound care indicated.  Okay to be discharged from podiatry standpoint.  Will need to follow-up with podiatry outpatient. MRSA screen positive 09/21/21 Follow-up with infectious disease.   History of upper GI bleed with ground coffee emesis/LA grade A seen on EGD done on 11/25/2019 by Dr. Benson Norway   IV Protonix 40 mg twice daily,  switched to p.o. Protonix 40 mg twice daily on 09/29/2021. Hemoglobin stable No overt bleeding Follow-up with GI outpatient.   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 Follow-up with your primary care provider.  Diabetes mellitus type 2 Hemoglobin A1c 6.0 09/20/21 Resume home regimen.  Chronic pain Resume home regimen.   Physical debility PT assessed and recommended SNF Continue PT OT with assistance and fall precautions. Patient has declined SNF.   Obesity BMI 31 Recommend weight loss outpatient with regular physical activity and healthy dieting.   Dry eyes/left conjunctivitis, suspect bacterial Received Cipro eyedrops X 3 days, completed     Estimated body mass index is 31.79 kg/m as calculated from the following:   Height as of this encounter: 6\' 6"  (1.981 m).   Weight as of this encounter: 124.8 kg.      Code Status: Full     Consultants: Podiatry GI PCCM     Procedures: Mechanical ventilation on 12/5-12/6   Antimicrobials: Currently none   DVT prophylaxis: Eliquis   Discharge Exam: BP (!) 156/84   Pulse 77   Temp 98.1 F (36.7 C) (Oral)   Resp 20   Ht 6\' 6"  (1.981 m)   Wt 122.9 kg   SpO2 98%   BMI 31.31 kg/m  General: 70 y.o. year-old male well developed well nourished in no acute distress.  Alert and oriented x3. Cardiovascular: Regular rate  and rhythm with no rubs or gallops.  No thyromegaly or JVD noted.   Respiratory: Clear to auscultation with no wheezes or rales. Good inspiratory effort. Abdomen: Soft nontender nondistended with normal bowel sounds x4 quadrants. Musculoskeletal: Trace lower extremity edema bilaterally. Psychiatry: Mood is appropriate for condition and setting  Discharge Instructions You were cared for by a hospitalist during your hospital stay. If you have any questions about your discharge medications or the care you received  while you were in the hospital after you are discharged, you can call the unit and asked to speak with the hospitalist on call if the hospitalist that took care of you is not available. Once you are discharged, your primary care physician will handle any further medical issues. Please note that NO REFILLS for any discharge medications will be authorized once you are discharged, as it is imperative that you return to your primary care physician (or establish a relationship with a primary care physician if you do not have one) for your aftercare needs so that they can reassess your need for medications and monitor your lab values.   Allergies as of 10/01/2021       Reactions   Other Other (See Comments)   Blood pressure issues Per [facility] hospital: Pt states he can only take these pain meds or else he gets very sick: Oxycontin,morphine,demerol, and dilaudid are the only pain meds pt states he can take.    Oxymorphone Other (See Comments)   Causes kidney problems   Aspirin Nausea And Vomiting   Per [facility]   Beta Vulgaris Nausea And Vomiting   Beets   Buspirone Other (See Comments)   Unknown reaction   Cabbage Nausea And Vomiting   Codeine Other (See Comments)   unknown   Dolobid [diflunisal]    Unknown reaction   Fish Allergy Nausea And Vomiting   Fish-derived Products Nausea And Vomiting   Methadone    Unknown reaction   Pentazocine Other (See Comments)   Unknown reaction   Propoxyphene Other (See Comments)   Unknown reaction   Shellfish Allergy Nausea And Vomiting   Sulfa Antibiotics Hives   Sulfasalazine Other (See Comments)   Unknown reaction   Vistaril [hydroxyzine]    Unknown reaction   Amoxicillin Rash   Per [facility]        Medication List     STOP taking these medications    cloNIDine 0.3 MG tablet Commonly known as: CATAPRES   clotrimazole 1 % cream Commonly known as: LOTRIMIN   furosemide 40 MG tablet Commonly known as: LASIX    levofloxacin 500 MG tablet Commonly known as: LEVAQUIN   Zofran 4 MG tablet Generic drug: ondansetron       TAKE these medications    acetaminophen 500 MG tablet Commonly known as: TYLENOL Take 2 tablets (1,000 mg total) by mouth every 8 (eight) hours. What changed:  when to take this reasons to take this   allopurinol 100 MG tablet Commonly known as: ZYLOPRIM Take 100 mg by mouth daily.   amLODipine 5 MG tablet Commonly known as: NORVASC Take 5 tablets by mouth daily.   apixaban 5 MG Tabs tablet Commonly known as: ELIQUIS Take 1 tablet (5 mg total) by mouth 2 (two) times daily. Start taking on: October 22, 2021   atorvastatin 80 MG tablet Commonly known as: LIPITOR Take 1 tablet (80 mg total) by mouth daily.   cloNIDine 0.3 mg/24hr patch Commonly known as: CATAPRES - Dosed in mg/24 hr  Place 1 patch (0.3 mg total) onto the skin once a week.   colchicine 0.6 MG tablet Take 0.6 mg by mouth daily.   cyanocobalamin 100 MCG tablet Take 2 tablets (200 mcg total) by mouth daily. What changed:  medication strength how much to take   ferrous sulfate 325 (65 FE) MG tablet Take 1 tablet (325 mg total) by mouth daily with breakfast.   finasteride 5 MG tablet Commonly known as: PROSCAR Take 1 tablet (5 mg total) by mouth daily.   gabapentin 300 MG capsule Commonly known as: NEURONTIN Take 1 capsule (300 mg total) by mouth at bedtime. What changed: when to take this   glipiZIDE 10 MG tablet Commonly known as: GLUCOTROL Take 10 mg by mouth daily before breakfast.   hydrALAZINE 100 MG tablet Commonly known as: APRESOLINE Take 1 tablet (100 mg total) by mouth every 8 (eight) hours. What changed: when to take this   lactulose 10 GM/15ML solution Commonly known as: CHRONULAC Take 15 mLs (10 g total) by mouth 2 (two) times daily.   levothyroxine 50 MCG tablet Commonly known as: SYNTHROID Take 50 mcg by mouth daily before breakfast.   methocarbamol 750 MG  tablet Commonly known as: ROBAXIN Take 1 tablet (750 mg total) by mouth every 8 (eight) hours as needed for muscle spasms.   metoprolol tartrate 50 MG tablet Commonly known as: LOPRESSOR Take 1 tablet (50 mg total) by mouth 2 (two) times daily. What changed:  medication strength how much to take   naloxegol oxalate 12.5 MG Tabs tablet Commonly known as: MOVANTIK Take 1 tablet (12.5 mg total) by mouth daily. What changed:  medication strength how much to take   OxyCONTIN 30 MG 12 hr tablet Generic drug: oxyCODONE Take 1 tablet (30 mg total) by mouth 2 (two) times daily. What changed: when to take this   pantoprazole 40 MG tablet Commonly known as: PROTONIX Take 1 tablet (40 mg total) by mouth 2 (two) times daily.   saccharomyces boulardii 250 MG capsule Commonly known as: FLORASTOR Take 1 capsule (250 mg total) by mouth 2 (two) times daily.   tamsulosin 0.4 MG Caps capsule Commonly known as: FLOMAX Take 1 capsule (0.4 mg total) by mouth daily.       ASK your doctor about these medications    cephALEXin 500 MG capsule Commonly known as: KEFLEX Take 1 capsule (500 mg total) by mouth every 8 (eight) hours for 5 days. Ask about: Should I take this medication?   doxycycline 100 MG tablet Commonly known as: VIBRA-TABS Take 1 tablet (100 mg total) by mouth every 12 (twelve) hours for 5 days. Ask about: Should I take this medication?       Allergies  Allergen Reactions   Other Other (See Comments)    Blood pressure issues Per [facility] hospital: Pt states he can only take these pain meds or else he gets very sick: Oxycontin,morphine,demerol, and dilaudid are the only pain meds pt states he can take.     Oxymorphone Other (See Comments)    Causes kidney problems   Aspirin Nausea And Vomiting    Per [facility]   Beta Vulgaris Nausea And Vomiting    Beets   Buspirone Other (See Comments)    Unknown reaction   Cabbage Nausea And Vomiting   Codeine Other  (See Comments)    unknown   Dolobid [Diflunisal]     Unknown reaction   Fish Allergy Nausea And Vomiting   Fish-Derived Products Nausea  And Vomiting   Methadone     Unknown reaction   Pentazocine Other (See Comments)    Unknown reaction   Propoxyphene Other (See Comments)    Unknown reaction   Shellfish Allergy Nausea And Vomiting   Sulfa Antibiotics Hives   Sulfasalazine Other (See Comments)    Unknown reaction   Vistaril [Hydroxyzine]     Unknown reaction   Amoxicillin Rash    Per [place] Follow up.   Specialty: Home Health Services Why: For home health services Contact information: [address] [place] [address] [phone]         Felipa Furnace, DPM. Call today.   Specialty: Podiatry Contact information: [address] [address] [phone]         Felipa Furnace, DPM. Call today.   Specialty: Podiatry Why: Please call for a post hospital follow-up appointment. Contact information: [place] [address] [phone]         Antonietta Jewel, MD. Call today.   Specialty: Internal Medicine Why: Please call for a posthospital follow-up appointment. Contact information: [address] [place] [address] [phone]         Lucas Mallow, MD. Call today.   Specialty: Urology Why: Please call for a posthospital follow-up appointment and voiding trial in the office. Contact information: [place] [address] [phone]         Cadott Callas, NP. Call today.   Specialty: Infectious Diseases Why: Please call for a posthospital follow-up appointment. Contact information: [place] [address] [phone]         Lenell Antu, MD. Call today.   Specialty: Family Medicine Why: Please call for a post hospital follow-up appointment. Contact information: [place] [address] [phone]                  The results of significant diagnostics from this hospitalization (including imaging, microbiology, ancillary and laboratory) are listed below for reference.    Significant Diagnostic Studies: CT ABDOMEN PELVIS WO CONTRAST  Result Date: 09/23/2021 CLINICAL DATA:  Abdominal distension. EXAM: CT ABDOMEN AND PELVIS WITHOUT CONTRAST TECHNIQUE: Multidetector CT imaging of the abdomen and pelvis was performed following the standard protocol without IV contrast. COMPARISON:  September 17, 2021. FINDINGS: Lower chest: Small bilateral pleural effusions are noted. Stable left lower lobe opacity is noted concerning for pneumonia. Hepatobiliary: No focal liver abnormality is seen. No gallstones, gallbladder wall thickening, or biliary dilatation. Pancreas: Unremarkable. No pancreatic ductal dilatation or surrounding inflammatory changes. Spleen: Normal in size without focal abnormality. Adrenals/Urinary Tract: Adrenal glands appear normal. Moderate to severe right hydroureteronephrosis is noted without evidence of obstructing calculus. Moderate right renal cortical atrophy is noted. Transition zone is seen in the distal right ureter in the pelvis concerning for stricture. Left kidney is unremarkable. Mild urinary bladder distention is noted. Stomach/Bowel: Stomach appears normal. There is no evidence of bowel obstruction or inflammation. Vascular/Lymphatic: Aortic atherosclerosis. No enlarged abdominal or pelvic lymph nodes. IVC filter is noted in infrarenal position. Reproductive: Prostate is unremarkable. Other: No abdominal wall hernia or abnormality. No abdominopelvic ascites. Musculoskeletal: No acute or significant osseous findings. IMPRESSION: Small bilateral pleural effusions are noted. Stable left lower lobe opacity is noted concerning for pneumonia. Moderate to severe right hydroureteronephrosis is noted without evidence of obstructing calculus  which is increased compared to prior exam. Moderate right renal cortical atrophy is noted suggesting longstanding obstruction. Transition zone is seen in the distal right ureter in the pelvis concerning for stricture. Aortic Atherosclerosis (ICD10-I70.0). Electronically Signed   By: Marijo Conception M.D.   On: 09/23/2021 12:27   DG Abd 1 View  Result Date: 09/23/2021 CLINICAL DATA:  Intubation. EXAM: PORTABLE CHEST 1 VIEW COMPARISON:  Chest radiograph dated 09/16/2021. FINDINGS: Endotracheal tube with tip approximately 3.5 cm above the carina. Enteric tube extends below the diaphragm with tip and side-port in the left upper abdomen likely in the proximal stomach. Diffuse interstitial coarsening and streaky densities which may represent chronic changes. Interval improvement of the previously seen left lung base density. Residual infiltrate is not excluded no pleural effusion pneumothorax. Stable cardiomegaly. Median sternotomy wires. Atherosclerotic calcification of the aorta. Nonspecific bowel gas pattern. Air is noted within the colon. An IVC filter is noted. Osteopenia with degenerative changes of the spine and spinal fusion hardware. No acute osseous pathology. IMPRESSION: Endotracheal tube with tip above the carina. Enteric tube extends below the diaphragm with tip and side-port in the proximal stomach. Electronically Signed   By: Anner Crete M.D.   On: 09/23/2021 19:17   CT HEAD WO CONTRAST (5MM)  Result Date: 09/23/2021 CLINICAL DATA:  Delirium EXAM: CT HEAD WITHOUT CONTRAST TECHNIQUE: Contiguous axial images were obtained from the base of the skull through the vertex without intravenous contrast. COMPARISON:  CT head August 24, 2021. FINDINGS: Motion limited study.  Within this limitation: Brain: New hypodensity in the left occipital lobe (series 3, image 12; series 5, image 40). No acute hemorrhage a mass lesion, midline shift, or extra-axial fluid collection. Similar small hypodensity in the  right basal ganglia, likely a remote lacunar infarct or dilated perivascular space. Similar atrophy. Vascular: No obvious hyperdense vessel with limited evaluation. Skull: No acute fracture. Sinuses/Orbits: Clear sinuses.  Unremarkable orbits. Other: No mastoid effusions IMPRESSION: Motion limited study with new hypoattenuation in the left occipital lobe. While this could represent artifact, acute infarct is not excluded. If the patient is able, MRI could provide more sensitive evaluation for acute infarct. Electronically Signed   By: Margaretha Sheffield M.D.   On: 09/23/2021 12:31   MR THORACIC SPINE WO CONTRAST  Result Date: 09/17/2021 CLINICAL DATA:  Back pain. History of thoracolumbar fusion. 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 EXAM: MRI THORACIC SPINE WITHOUT CONTRAST TECHNIQUE: Multiplanar, multisequence MR imaging of the thoracic spine was performed. No intravenous contrast was administered. COMPARISON:  CT scan 09/17/2021 and prior thoracic spine MRI 08/30/2020 FINDINGS: Examination is limited by artifact from the thoracolumbar hardware and patient motion. Alignment: Normal overall alignment. Vertebrae: Normal marrow signal. No bone lesions or fractures. Chronic changes at T10-11 but no findings suspicious for active discitis or osteomyelitis. Cord: Grossly normal cord signal intensity. No obvious cord lesions. Paraspinal and other soft tissues: No significant paraspinal findings. No paraspinal abscess. Disc levels: Postoperative changes with posterior fusion hardware from T10-T12. No obvious complicating features are identified. No obvious spinal or foraminal stenosis in this area. Shallow left paracentral disc protrusion at T8-9 with mild flattening of the thecal sac. No foraminal stenosis. No other thoracic disc protrusions, spinal or foraminal stenosis identified. IMPRESSION: 1. Postoperative changes with posterior fusion hardware from T10-T12. No obvious complicating features are  identified. 2. Chronic changes at T10-11 without findings suspicious for recurrent/active discitis/osteomyelitis. 3. Shallow left paracentral disc protrusion at T8-9 with mild flattening of the  thecal sac. 4. No other thoracic disc protrusions, spinal or foraminal stenosis. Electronically Signed   By: Marijo Sanes M.D.   On: 09/17/2021 09:33   US RENAL  Result Date: 09/30/2021 CLINICAL DATA:  Hydronephrosis EXAM: RENAL / URINARY TRACT ULTRASOUND COMPLETE COMPARISON:  None. FINDINGS: Right Kidney: Renal measurements: 11.2 x 5.2 x 6.0 cm = volume: 182 mL. Echogenicity within normal limits. Severe hydronephrosis and proximal hydroureter. No calculi or other obstruction identified. Left Kidney: Renal measurements: 13.1 x 6.3 x 6.7 cm = volume: 288 mL. Echogenicity within normal limits. No mass or hydronephrosis visualized. Bladder: Decompressed by Foley catheter. Other: None. IMPRESSION: 1. Severe right hydronephrosis and proximal hydroureter, as seen by prior CT. No calculi or other obstruction identified. 2. No left hydronephrosis. 3. Bladder decompressed by Foley catheter. Electronically Signed   By: Delanna Ahmadi M.D.   On: 09/30/2021 16:49   MR FOOT LEFT WO CONTRAST  Result Date: 09/17/2021 CLINICAL DATA:  Osteomyelitis, foot EXAM: MRI OF THE LEFT FOOT WITHOUT CONTRAST TECHNIQUE: Multiplanar, multisequence MR imaging of the left foot was performed. No intravenous contrast was administered. COMPARISON:  MRI 04/06/2020, radiograph 04/05/2020 FINDINGS: Bones/Joint/Cartilage Unchanged severe arthritis with chronic erosive changes of the distal first and second metatarsals and adjacent proximal phalanges. Unchanged severe arthritis and erosive changes of the third through fifth digits. Unchanged posttraumatic or severe arthritic deformity of the distal fifth metatarsal. There is preserved T1 signal throughout the foot. There is mild paratracheal bony edema within the second digit phalanges. Unchanged severe  arthritis of the tarsometatarsal joints. There is periarticular bony edema throughout the midfoot likely related to arthritis. Ligaments Multiple disrupted MCP and interphalangeal joint ligaments. Muscles and Tendons Diffuse atrophy in the foot and swelling as is commonly seen in diabetics. Soft tissues Diffuse soft tissue swelling. No focal fluid collection on noncontrast exam. IMPRESSION: Mild periarticular bony edema within the second digit phalanges with preserved T1 marrow signal. This is favored to be related to severe arthritis/reactive marrow change, though if there is an adjacent soft tissue ulcer, early osteomyelitis would be possible. No other evidence of osteomyelitis in the foot. Diffuse soft tissue swelling. No evidence of soft tissue abscess on noncontrast exam. 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 Electronically Signed   By: Maurine Simmering M.D.   On: 09/17/2021 09:12   Portable Chest x-ray  Result Date: 09/23/2021 CLINICAL DATA:  Intubation. EXAM: PORTABLE CHEST 1 VIEW COMPARISON:  Chest radiograph dated 09/16/2021. FINDINGS: Endotracheal tube with tip approximately 3.5 cm above the carina. Enteric tube extends below the diaphragm with tip and side-port in the left upper abdomen likely in the proximal stomach. Diffuse interstitial coarsening and streaky densities which may represent chronic changes. Interval improvement of the previously seen left lung base density. Residual infiltrate is not excluded no pleural effusion pneumothorax. Stable cardiomegaly. Median sternotomy wires. Atherosclerotic calcification of the aorta. Nonspecific bowel gas pattern. Air is noted within the colon. An IVC filter is noted. Osteopenia with degenerative changes of the spine and spinal fusion hardware. No acute osseous pathology. IMPRESSION: Endotracheal tube with tip above the carina. Enteric tube extends below the diaphragm with tip and side-port in the proximal  stomach. Electronically Signed   By: Anner Crete M.D.   On: 09/23/2021 19:17   DG Chest Port 1 View  Result Date: 09/17/2021 CLINICAL DATA:  Shortness of breath EXAM: PORTABLE CHEST 1 VIEW COMPARISON:  08/24/2021 FINDINGS: Left basilar consolidation is new since the prior study.  Mild cardiomegaly. Shallow lung inflation. IMPRESSION: Left basilar consolidation, likely pneumonia. Electronically Signed   By: Ulyses Jarred M.D.   On: 09/17/2021 00:01   DG Abd Portable 1V  Result Date: 09/21/2021 CLINICAL DATA:  Nausea and vomiting, abdominal pain. EXAM: PORTABLE ABDOMEN - 1 VIEW COMPARISON:  Plain film of the abdomen dated 09/15/2020. FINDINGS: Visualized bowel gas pattern is nonobstructive. No evidence of soft tissue mass or abnormal fluid collection. No evidence of free intraperitoneal air. There is a dense opacity at the LEFT lung base, similar to chest x-ray of 09/16/2021, suspected pneumonia. IMPRESSION: 1. Nonobstructive bowel gas pattern and no evidence of acute intra-abdominal abnormality. 2. Probable LEFT lower lobe pneumonia. Electronically Signed   By: Franki Cabot M.D.   On: 09/21/2021 14:22   ECHOCARDIOGRAM COMPLETE  Result Date: 09/24/2021    ECHOCARDIOGRAM REPORT   Patient Name:   Thomas [name] Date of Exam: 09/24/2021 Medical Rec #:  [id number]       Height:       78.0 in Accession #:    [id number]      Weight:       261.5 lb Date of Birth:  04-10-1951       BSA:          2.530 m Patient Age:    62 years        BP:           172/105 mmHg Patient Gender: M               HR:           94 bpm. Exam Location:  Inpatient Procedure: 2D Echo, Color Doppler and Cardiac Doppler Indications:    R06.9 DOE  History:        Patient has prior history of Echocardiogram examinations, most                 recent 06/17/2021. CHF; Risk Factors:Hypertension and Diabetes.                 Prior performed at [facility].  Sonographer:    [place] Referring Phys: [place] Comments: See sonographer comments IMPRESSIONS  1. Left ventricular ejection fraction, by estimation, is 35 to 40%. The left ventricle has moderately decreased function. The left ventricle demonstrates global hypokinesis. There is moderate concentric left ventricular hypertrophy. Left ventricular diastolic parameters are indeterminate.  2. Right ventricular systolic function is normal. The right ventricular size is moderately enlarged. There is severely elevated pulmonary artery systolic pressure.  3. Left atrial size was moderately dilated.  4. Right atrial size was mildly dilated.  5. The mitral valve is abnormal. Moderate mitral valve regurgitation.  6. Tricuspid valve regurgitation is mild to moderate.  7. The aortic valve is tricuspid. There is mild calcification of the aortic valve. Aortic valve regurgitation is not visualized.  8. Aortic dilatation noted. There is mild dilatation of the ascending aorta, measuring 42 mm.  9. The inferior vena cava is dilated in size with >50% respiratory variability, suggesting right atrial pressure of 8 mmHg. Comparison(s): No prior Echocardiogram. FINDINGS  Left Ventricle: Left ventricular ejection fraction, by estimation, is 35 to 40%. The left ventricle has moderately decreased function. The left ventricle demonstrates global hypokinesis. The left ventricular internal cavity size was normal in size. There is moderate concentric left ventricular hypertrophy. Left ventricular diastolic parameters are indeterminate. Right Ventricle: The right ventricular size is moderately enlarged. No increase in  right ventricular wall thickness. Right ventricular systolic function is normal. There is severely elevated pulmonary artery systolic pressure. The tricuspid regurgitant velocity is 3.73 m/s, and with an assumed right atrial pressure of 8 mmHg, the estimated right ventricular systolic pressure is 57.3 mmHg. Left Atrium: Left atrial size was moderately dilated. Right  Atrium: Right atrial size was mildly dilated. Pericardium: There is no evidence of pericardial effusion. Mitral Valve: The mitral valve is abnormal. Moderate mitral valve regurgitation. Tricuspid Valve: The tricuspid valve is normal in structure. Tricuspid valve regurgitation is mild to moderate. No evidence of tricuspid stenosis. Aortic Valve: The aortic valve is tricuspid. There is mild calcification of the aortic valve. Aortic valve regurgitation is not visualized. Pulmonic Valve: The pulmonic valve was not well visualized. Pulmonic valve regurgitation is not visualized. No evidence of pulmonic stenosis. Aorta: Aortic dilatation noted. There is mild dilatation of the ascending aorta, measuring 42 mm. Venous: The inferior vena cava is dilated in size with greater than 50% respiratory variability, suggesting right atrial pressure of 8 mmHg. IAS/Shunts: The atrial septum is grossly normal.  LEFT VENTRICLE PLAX 2D LVIDd:         5.00 cm LVIDs:         3.90 cm LV PW:         1.40 cm LV IVS:        1.40 cm LVOT diam:     2.20 cm LV SV:         52 LV SV Index:   20 LVOT Area:     3.80 cm  LV Volumes (MOD) LV vol d, MOD A2C: 141.0 ml LV vol d, MOD A4C: 165.0 ml LV vol s, MOD A2C: 94.4 ml LV vol s, MOD A4C: 101.0 ml LV SV MOD A2C:     46.6 ml LV SV MOD A4C:     165.0 ml LV SV MOD BP:      55.4 ml RIGHT VENTRICLE RV S prime:     11.60 cm/s TAPSE (M-mode): 1.7 cm LEFT ATRIUM              Index        RIGHT ATRIUM           Index LA diam:        4.30 cm  1.70 cm/m   RA Area:     25.20 cm LA Vol (A2C):   106.0 ml 41.90 ml/m  RA Volume:   88.50 ml  34.98 ml/m LA Vol (A4C):   117.0 ml 46.25 ml/m LA Biplane Vol: 115.0 ml 45.46 ml/m  AORTIC VALVE LVOT Vmax:   69.20 cm/s LVOT Vmean:  52.300 cm/s LVOT VTI:    0.136 m  AORTA Ao Root diam: 3.60 cm Ao Asc diam:  4.20 cm TRICUSPID VALVE TR Peak grad:   55.7 mmHg TR Vmax:        373.00 cm/s  SHUNTS Systemic VTI:  0.14 m Systemic Diam: 2.20 cm Rudean Haskell MD  Electronically signed by Rudean Haskell MD Signature Date/Time: 09/24/2021/4:12:44 PM    Final    CT Renal Stone Study  Result Date: 09/17/2021 CLINICAL DATA:  Acute kidney injury EXAM: CT ABDOMEN AND PELVIS WITHOUT CONTRAST TECHNIQUE: Multidetector CT imaging of the abdomen and pelvis was performed following the standard protocol without IV contrast. COMPARISON:  08/24/2021 FINDINGS: LOWER CHEST: Left basilar consolidation. HEPATOBILIARY: Normal hepatic contours. No intra- or extrahepatic biliary dilatation. The gallbladder is normal. PANCREAS: Normal pancreas. No ductal dilatation or peripancreatic fluid collection.  SPLEEN: Normal. ADRENALS/URINARY TRACT: The adrenal glands are normal. The right kidney is atrophic. There is moderate right hydroureteronephrosis. The urinary bladder is normal for degree of distention STOMACH/BOWEL: There is no hiatal hernia. Normal duodenal course and caliber. No small bowel dilatation or inflammation. No focal colonic abnormality. Normal appendix. VASCULAR/LYMPHATIC: There is calcific aortic atherosclerosis. There is an infrarenal IVC filter. REPRODUCTIVE: Normal prostate size with symmetric seminal vesicles. MUSCULOSKELETAL. No bony spinal canal stenosis or focal osseous abnormality. OTHER: None. IMPRESSION: 1. Moderate right hydroureteronephrosis without obstructing stone or mass. 2. Atrophic right kidney. 3. Left basilar consolidation, concerning for pneumonia. Aortic Atherosclerosis (ICD10-I70.0). Electronically Signed   By: Ulyses Jarred M.D.   On: 09/17/2021 01:39   VAS [place] LOWER EXTREMITY VENOUS (DVT)  Result Date: 09/17/2021  Lower Venous DVT Study Patient Name:  Thomas [name]  Date of Exam:   09/17/2021 Medical Rec #: [id number]        Accession #:    [id number] Date of Birth: [birth year]        Patient Gender: M Patient Age:   57 years Exam Location:  [facility] Procedure:      VAS [place] LOWER EXTREMITY VENOUS (DVT) Referring Phys: Gean Birchwood  --------------------------------------------------------------------------------  Indications: Ulceration, Pain, Edema, and Erythema.  Limitations: Body habitus and poor ultrasound/tissue interface. Comparison Study: No previous exams Performing Technologist: Jody Hill RVT, RDMS  Examination Guidelines: A complete evaluation includes B-mode imaging, spectral Doppler, color Doppler, and power Doppler as needed of all accessible portions of each vessel. Bilateral testing is considered an integral part of a complete examination. Limited examinations for reoccurring indications may be performed as noted. The reflux portion of the exam is performed with the patient in reverse Trendelenburg.  +-----+---------------+---------+-----------+----------+--------------+ RIGHTCompressibilityPhasicitySpontaneityPropertiesThrombus Aging +-----+---------------+---------+-----------+----------+--------------+ CFV  Full           Yes      Yes                                 +-----+---------------+---------+-----------+----------+--------------+   +---------+---------------+---------+-----------+----------+-------------------+ LEFT     CompressibilityPhasicitySpontaneityPropertiesThrombus Aging      +---------+---------------+---------+-----------+----------+-------------------+ CFV      Full           Yes      Yes                                      +---------+---------------+---------+-----------+----------+-------------------+ SFJ      Full                                                             +---------+---------------+---------+-----------+----------+-------------------+ FV Prox  Full           Yes      Yes                                      +---------+---------------+---------+-----------+----------+-------------------+ FV Mid   Partial        No       No                   Age  Indeterminate   +---------+---------------+---------+-----------+----------+-------------------+  FV DistalPartial        Yes      Yes                  Age Indeterminate   +---------+---------------+---------+-----------+----------+-------------------+ PFV      Full                                                             +---------+---------------+---------+-----------+----------+-------------------+ POP      Full           Yes      Yes                                      +---------+---------------+---------+-----------+----------+-------------------+ PTV                     Yes      Yes                  Not well visualized +---------+---------------+---------+-----------+----------+-------------------+ PERO                    Yes      Yes                  Not well visualized +---------+---------------+---------+-----------+----------+-------------------+ Only one of paired veins seen in both posterior tibial & peroneal veins. Patent by color and doppler.    Summary: RIGHT: - No evidence of common femoral vein obstruction.  LEFT: - Findings consistent with age indeterminate deep vein thrombosis involving the left femoral vein. - There is no evidence of superficial venous thrombosis.  - No cystic structure found in the popliteal fossa. Subcutaneous edema noted throughout.  *See table(s) above for measurements and observations. Electronically signed by Harold Barban MD on 09/17/2021 at 7:01:35 PM.    Final     Microbiology: No results found for this or any previous visit (from the past 240 hour(s)).   Labs: Basic Metabolic Panel: Recent Labs  Lab 09/26/21 0530 09/27/21 0611 09/28/21 0551 09/29/21 0601 09/30/21 0516  NA 141 139 139 139 136  K 4.5 4.1 4.7 4.4 4.6  CL 110 110 110 107 105  CO2 22 23 20* 23 23  GLUCOSE 107* 99 119* 111* 128*  BUN 36* 34* 35* 35* 35*  CREATININE 3.08* 3.07* 3.03* 2.76* 2.87*  CALCIUM 7.1* 7.2* 7.5* 7.5* 7.5*   Liver Function Tests: Recent Labs  Lab 09/25/21 0321  AST 31  ALT 13  ALKPHOS 73  BILITOT 0.7  PROT 7.0   ALBUMIN 2.9*   No results for input(s): LIPASE, AMYLASE in the last 168 hours. No results for input(s): AMMONIA in the last 168 hours. CBC: Recent Labs  Lab 09/27/21 0611 09/28/21 0551 09/29/21 0601 09/30/21 0516 10/01/21 0512  WBC 7.1 7.3 7.2 7.2 6.7  NEUTROABS 4.6 4.9 4.8 4.8 3.8  HGB 8.9* 9.6* 9.1* 9.3* 9.2*  HCT 29.4* 31.1* 29.5* 29.9* 29.3*  MCV 102.1* 100.6* 99.7 100.7* 102.8*  PLT 243 245 252 241 258   Cardiac Enzymes: No results for input(s): CKTOTAL, CKMB, CKMBINDEX, TROPONINI in the last 168 hours. BNP: BNP (last 3 results) Recent Labs    09/16/21 2245  BNP 97.5  ProBNP (last 3 results) No results for input(s): PROBNP in the last 8760 hours.  CBG: Recent Labs  Lab 09/30/21 2329 10/01/21 0046 10/01/21 0400 10/01/21 0751 10/01/21 1150  GLUCAP 176* 174* 146* 208* 124*       Signed:  Kayleen Memos, MD [facility] 10/01/2021, 12:26 PM

## 2021-10-01 NOTE — Plan of Care (Signed)
°  Problem: Education: Goal: Knowledge of General Education information will improve Description: Including pain rating scale, medication(s)/side effects and non-pharmacologic comfort measures Outcome: Adequate for Discharge   Problem: Health Behavior/Discharge Planning: Goal: Ability to manage health-related needs will improve Outcome: Adequate for Discharge   Problem: Clinical Measurements: Goal: Ability to maintain clinical measurements within normal limits will improve Outcome: Adequate for Discharge Goal: Will remain free from infection Outcome: Adequate for Discharge Goal: Diagnostic test results will improve Outcome: Adequate for Discharge Goal: Respiratory complications will improve Outcome: Adequate for Discharge Goal: Cardiovascular complication will be avoided Outcome: Adequate for Discharge   Problem: Activity: Goal: Risk for activity intolerance will decrease Outcome: Adequate for Discharge   Problem: Nutrition: Goal: Adequate nutrition will be maintained Outcome: Adequate for Discharge   Problem: Coping: Goal: Level of anxiety will decrease Outcome: Adequate for Discharge   Problem: Elimination: Goal: Will not experience complications related to bowel motility Outcome: Adequate for Discharge Goal: Will not experience complications related to urinary retention Outcome: Adequate for Discharge   Problem: Pain Managment: Goal: General experience of comfort will improve Outcome: Adequate for Discharge   Problem: Safety: Goal: Ability to remain free from injury will improve Outcome: Adequate for Discharge   Problem: Skin Integrity: Goal: Risk for impaired skin integrity will decrease Outcome: Adequate for Discharge   Problem: Education: Goal: Knowledge of disease and its progression will improve Outcome: Adequate for Discharge   Problem: Clinical Measurements: Goal: Complications related to the disease process or treatment will be avoided or  minimized Outcome: Adequate for Discharge Goal: Dialysis access will remain free of complications Outcome: Adequate for Discharge   Problem: Fluid Volume: Goal: Fluid volume balance will be maintained or improved Outcome: Adequate for Discharge   Problem: Urinary Elimination: Goal: Progression of disease will be identified and treated Outcome: Adequate for Discharge   Problem: Education: Goal: Ability to demonstrate management of disease process will improve Outcome: Adequate for Discharge Goal: Ability to verbalize understanding of medication therapies will improve Outcome: Adequate for Discharge Goal: Individualized Educational Video(s) Outcome: Adequate for Discharge   Problem: Activity: Goal: Capacity to carry out activities will improve Outcome: Adequate for Discharge   Problem: Cardiac: Goal: Ability to achieve and maintain adequate cardiopulmonary perfusion will improve Outcome: Adequate for Discharge

## 2021-10-07 ENCOUNTER — Encounter (HOSPITAL_COMMUNITY): Payer: Self-pay | Admitting: Emergency Medicine

## 2021-10-07 ENCOUNTER — Emergency Department (HOSPITAL_COMMUNITY): Payer: Medicare Other

## 2021-10-07 ENCOUNTER — Inpatient Hospital Stay (HOSPITAL_COMMUNITY)
Admission: EM | Admit: 2021-10-07 | Discharge: 2021-10-14 | DRG: 637 | Disposition: A | Discharge status: Home Health | Payer: Medicare Other | Attending: Family Medicine | Admitting: Family Medicine

## 2021-10-07 ENCOUNTER — Other Ambulatory Visit: Payer: Self-pay

## 2021-10-07 DIAGNOSIS — I130 Hypertensive heart and chronic kidney disease with heart failure and stage 1 through stage 4 chronic kidney disease, or unspecified chronic kidney disease: Secondary | ICD-10-CM | POA: Diagnosis present

## 2021-10-07 DIAGNOSIS — T68XXXA Hypothermia, initial encounter: Secondary | ICD-10-CM

## 2021-10-07 DIAGNOSIS — K219 Gastro-esophageal reflux disease without esophagitis: Secondary | ICD-10-CM | POA: Diagnosis present

## 2021-10-07 DIAGNOSIS — Z79890 Hormone replacement therapy (postmenopausal): Secondary | ICD-10-CM

## 2021-10-07 DIAGNOSIS — Z882 Allergy status to sulfonamides status: Secondary | ICD-10-CM

## 2021-10-07 DIAGNOSIS — E1122 Type 2 diabetes mellitus with diabetic chronic kidney disease: Secondary | ICD-10-CM | POA: Diagnosis present

## 2021-10-07 DIAGNOSIS — E039 Hypothyroidism, unspecified: Secondary | ICD-10-CM | POA: Diagnosis present

## 2021-10-07 DIAGNOSIS — I10 Essential (primary) hypertension: Secondary | ICD-10-CM | POA: Diagnosis present

## 2021-10-07 DIAGNOSIS — M4624 Osteomyelitis of vertebra, thoracic region: Secondary | ICD-10-CM | POA: Diagnosis present

## 2021-10-07 DIAGNOSIS — Z6831 Body mass index (BMI) 31.0-31.9, adult: Secondary | ICD-10-CM

## 2021-10-07 DIAGNOSIS — Z91013 Allergy to seafood: Secondary | ICD-10-CM

## 2021-10-07 DIAGNOSIS — I878 Other specified disorders of veins: Secondary | ICD-10-CM | POA: Diagnosis present

## 2021-10-07 DIAGNOSIS — E669 Obesity, unspecified: Secondary | ICD-10-CM | POA: Diagnosis present

## 2021-10-07 DIAGNOSIS — E119 Type 2 diabetes mellitus without complications: Secondary | ICD-10-CM

## 2021-10-07 DIAGNOSIS — Z7901 Long term (current) use of anticoagulants: Secondary | ICD-10-CM

## 2021-10-07 DIAGNOSIS — Z79899 Other long term (current) drug therapy: Secondary | ICD-10-CM

## 2021-10-07 DIAGNOSIS — E875 Hyperkalemia: Secondary | ICD-10-CM | POA: Diagnosis present

## 2021-10-07 DIAGNOSIS — Z79891 Long term (current) use of opiate analgesic: Secondary | ICD-10-CM

## 2021-10-07 DIAGNOSIS — E785 Hyperlipidemia, unspecified: Secondary | ICD-10-CM | POA: Diagnosis present

## 2021-10-07 DIAGNOSIS — G9341 Metabolic encephalopathy: Secondary | ICD-10-CM | POA: Diagnosis present

## 2021-10-07 DIAGNOSIS — N1330 Unspecified hydronephrosis: Secondary | ICD-10-CM | POA: Diagnosis present

## 2021-10-07 DIAGNOSIS — N400 Enlarged prostate without lower urinary tract symptoms: Secondary | ICD-10-CM | POA: Diagnosis present

## 2021-10-07 DIAGNOSIS — D6859 Other primary thrombophilia: Secondary | ICD-10-CM | POA: Diagnosis present

## 2021-10-07 DIAGNOSIS — E872 Acidosis, unspecified: Secondary | ICD-10-CM

## 2021-10-07 DIAGNOSIS — E1169 Type 2 diabetes mellitus with other specified complication: Secondary | ICD-10-CM | POA: Diagnosis present

## 2021-10-07 DIAGNOSIS — I5042 Chronic combined systolic (congestive) and diastolic (congestive) heart failure: Secondary | ICD-10-CM | POA: Diagnosis present

## 2021-10-07 DIAGNOSIS — Z86711 Personal history of pulmonary embolism: Secondary | ICD-10-CM

## 2021-10-07 DIAGNOSIS — Z7984 Long term (current) use of oral hypoglycemic drugs: Secondary | ICD-10-CM

## 2021-10-07 DIAGNOSIS — M1990 Unspecified osteoarthritis, unspecified site: Secondary | ICD-10-CM | POA: Diagnosis present

## 2021-10-07 DIAGNOSIS — Z89511 Acquired absence of right leg below knee: Secondary | ICD-10-CM

## 2021-10-07 DIAGNOSIS — I82409 Acute embolism and thrombosis of unspecified deep veins of unspecified lower extremity: Secondary | ICD-10-CM | POA: Diagnosis present

## 2021-10-07 DIAGNOSIS — I700 Atherosclerosis of aorta: Secondary | ICD-10-CM | POA: Diagnosis present

## 2021-10-07 DIAGNOSIS — Z881 Allergy status to other antibiotic agents status: Secondary | ICD-10-CM

## 2021-10-07 DIAGNOSIS — Z8673 Personal history of transient ischemic attack (TIA), and cerebral infarction without residual deficits: Secondary | ICD-10-CM

## 2021-10-07 DIAGNOSIS — E162 Hypoglycemia, unspecified: Secondary | ICD-10-CM | POA: Diagnosis not present

## 2021-10-07 DIAGNOSIS — Z981 Arthrodesis status: Secondary | ICD-10-CM

## 2021-10-07 DIAGNOSIS — E11649 Type 2 diabetes mellitus with hypoglycemia without coma: Principal | ICD-10-CM | POA: Diagnosis present

## 2021-10-07 DIAGNOSIS — Z885 Allergy status to narcotic agent status: Secondary | ICD-10-CM

## 2021-10-07 DIAGNOSIS — N184 Chronic kidney disease, stage 4 (severe): Secondary | ICD-10-CM | POA: Diagnosis present

## 2021-10-07 DIAGNOSIS — Z886 Allergy status to analgesic agent status: Secondary | ICD-10-CM

## 2021-10-07 DIAGNOSIS — I82402 Acute embolism and thrombosis of unspecified deep veins of left lower extremity: Secondary | ICD-10-CM | POA: Diagnosis present

## 2021-10-07 DIAGNOSIS — R680 Hypothermia, not associated with low environmental temperature: Secondary | ICD-10-CM | POA: Diagnosis present

## 2021-10-07 DIAGNOSIS — N1339 Other hydronephrosis: Secondary | ICD-10-CM | POA: Diagnosis present

## 2021-10-07 DIAGNOSIS — Z20822 Contact with and (suspected) exposure to covid-19: Secondary | ICD-10-CM | POA: Diagnosis present

## 2021-10-07 DIAGNOSIS — Z86718 Personal history of other venous thrombosis and embolism: Secondary | ICD-10-CM

## 2021-10-07 DIAGNOSIS — Z888 Allergy status to other drugs, medicaments and biological substances status: Secondary | ICD-10-CM

## 2021-10-07 DIAGNOSIS — Z95828 Presence of other vascular implants and grafts: Secondary | ICD-10-CM

## 2021-10-07 DIAGNOSIS — N179 Acute kidney failure, unspecified: Secondary | ICD-10-CM | POA: Diagnosis present

## 2021-10-07 DIAGNOSIS — M109 Gout, unspecified: Secondary | ICD-10-CM | POA: Diagnosis present

## 2021-10-07 DIAGNOSIS — G894 Chronic pain syndrome: Secondary | ICD-10-CM | POA: Diagnosis present

## 2021-10-07 DIAGNOSIS — Z89512 Acquired absence of left leg below knee: Secondary | ICD-10-CM

## 2021-10-07 LAB — URINALYSIS, ROUTINE W REFLEX MICROSCOPIC
Bilirubin Urine: NEGATIVE
Glucose, UA: NEGATIVE mg/dL
Hgb urine dipstick: NEGATIVE
Ketones, ur: NEGATIVE mg/dL
Nitrite: NEGATIVE
Protein, ur: 100 mg/dL — AB
Specific Gravity, Urine: 1.009 (ref 1.005–1.030)
pH: 5 (ref 5.0–8.0)

## 2021-10-07 LAB — BASIC METABOLIC PANEL
Anion gap: 12 (ref 5–15)
BUN: 80 mg/dL — ABNORMAL HIGH (ref 8–23)
CO2: 18 mmol/L — ABNORMAL LOW (ref 22–32)
Calcium: 7 mg/dL — ABNORMAL LOW (ref 8.9–10.3)
Chloride: 104 mmol/L (ref 98–111)
Creatinine, Ser: 4.64 mg/dL — ABNORMAL HIGH (ref 0.61–1.24)
GFR, Estimated: 13 mL/min — ABNORMAL LOW (ref >60)
Glucose, Bld: 40 mg/dL — CL (ref 70–99)
Potassium: 4.3 mmol/L (ref 3.5–5.1)
Sodium: 134 mmol/L — ABNORMAL LOW (ref 135–145)

## 2021-10-07 LAB — CBC
HCT: 28.2 % — ABNORMAL LOW (ref 39.0–52.0)
Hemoglobin: 8.6 g/dL — ABNORMAL LOW (ref 13.0–17.0)
MCH: 30.6 pg (ref 26.0–34.0)
MCHC: 30.5 g/dL (ref 30.0–36.0)
MCV: 100.4 fL — ABNORMAL HIGH (ref 80.0–100.0)
Platelets: 200 10*3/uL (ref 150–400)
RBC: 2.81 MIL/uL — ABNORMAL LOW (ref 4.22–5.81)
RDW: 15.1 % (ref 11.5–15.5)
WBC: 6.7 10*3/uL (ref 4.0–10.5)
nRBC: 0 % (ref 0.0–0.2)

## 2021-10-07 LAB — RESP PANEL BY RT-PCR (FLU A&B, COVID) ARPGX2
Influenza A by PCR: NEGATIVE
Influenza B by PCR: NEGATIVE
SARS Coronavirus 2 by RT PCR: NEGATIVE

## 2021-10-07 LAB — CBG MONITORING, ED
Glucose-Capillary: 47 mg/dL — ABNORMAL LOW (ref 70–99)
Glucose-Capillary: 74 mg/dL (ref 70–99)
Glucose-Capillary: 41 mg/dL — CL (ref 70–99)
Glucose-Capillary: 35 mg/dL — CL (ref 70–99)
Glucose-Capillary: 36 mg/dL — CL (ref 70–99)
Glucose-Capillary: 64 mg/dL — ABNORMAL LOW (ref 70–99)
Glucose-Capillary: 52 mg/dL — ABNORMAL LOW (ref 70–99)
Glucose-Capillary: 64 mg/dL — ABNORMAL LOW (ref 70–99)
Glucose-Capillary: 113 mg/dL — ABNORMAL HIGH (ref 70–99)

## 2021-10-07 LAB — LACTIC ACID, PLASMA
Lactic Acid, Venous: 2 mmol/L (ref 0.5–1.9)
Lactic Acid, Venous: 1.9 mmol/L (ref 0.5–1.9)

## 2021-10-07 IMAGING — CT CT HEAD W/O CM
3 series · 15 of 47 positions shown, 18 images · non-contrast
Comparison: [DATE].

CLINICAL DATA: Altered mental status.

EXAM:
CT HEAD WITHOUT CONTRAST
TECHNIQUE: Contiguous axial images were obtained from the base of the skull
through the vertex without intravenous contrast.

[Series 2: head wo · axial · 0.44mm/px · z∈[-116,+24]mm · 9 of 34 slices shown, 12 images]
[im 3/34  brain]
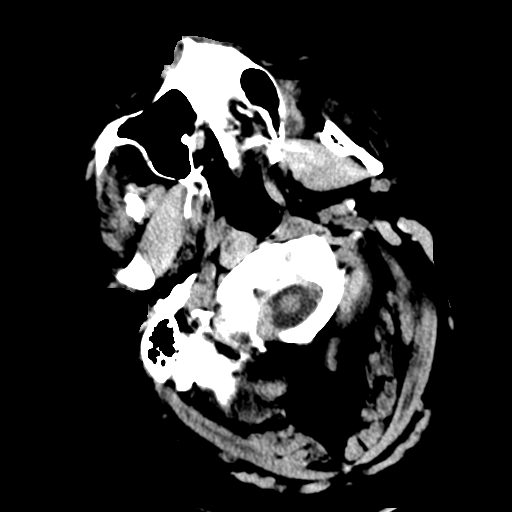
[im 3/34  bone]
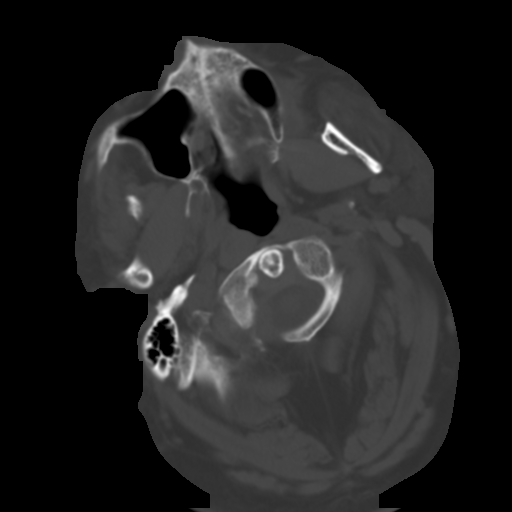
[im 6/34  brain]
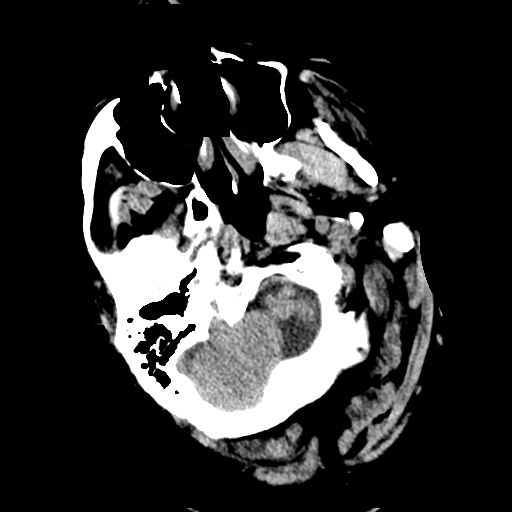
[im 10/34  brain]
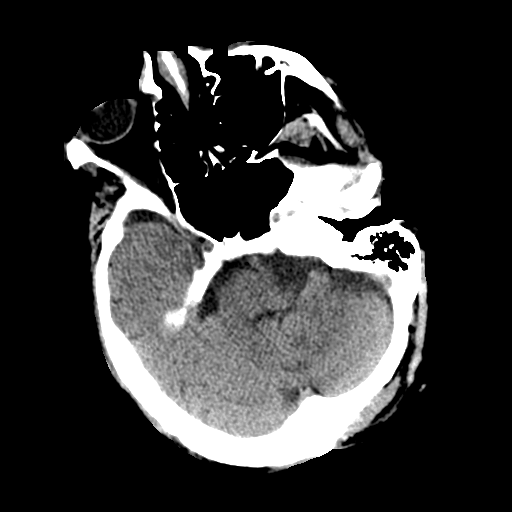
[im 13/34  brain]
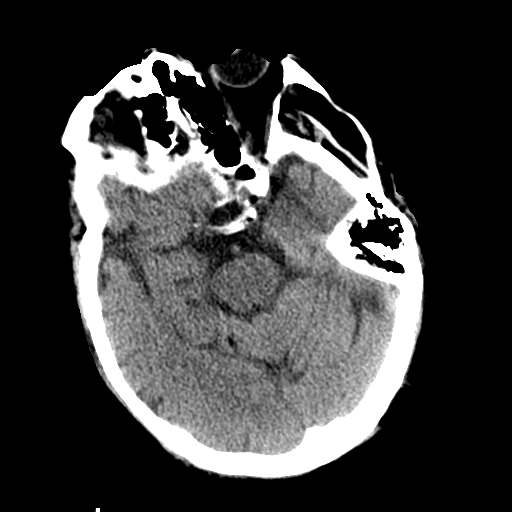
[im 18/34  brain]
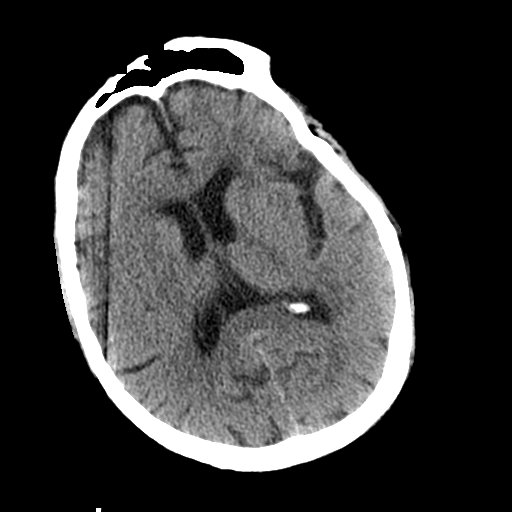
[im 18/34  bone]
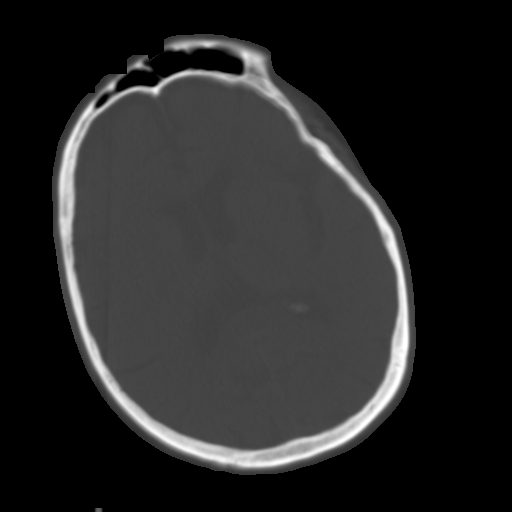
[im 21/34  brain]
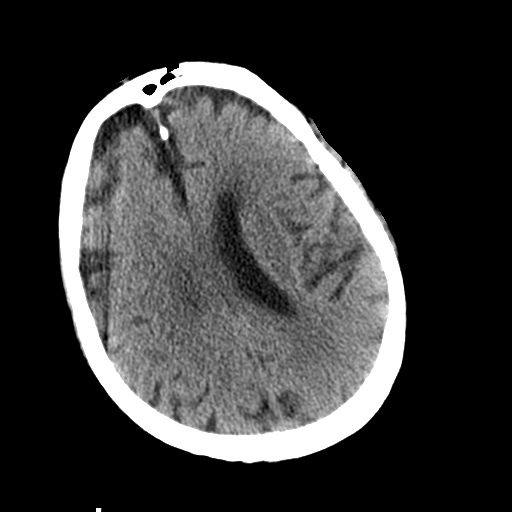
[im 24/34  brain]
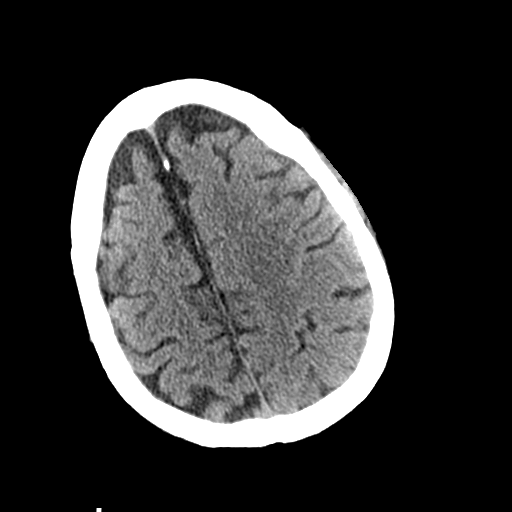
[im 28/34  brain]
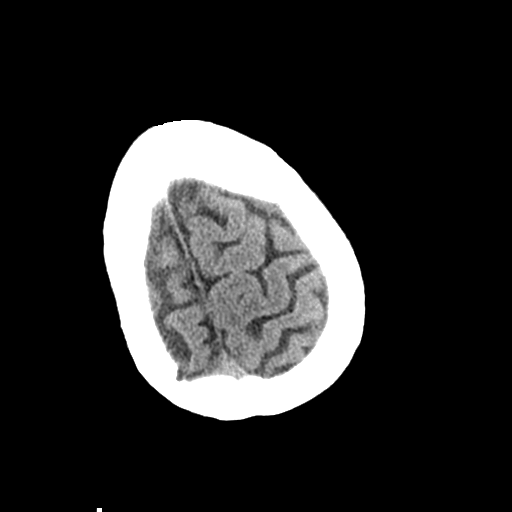
[im 31/34  brain]
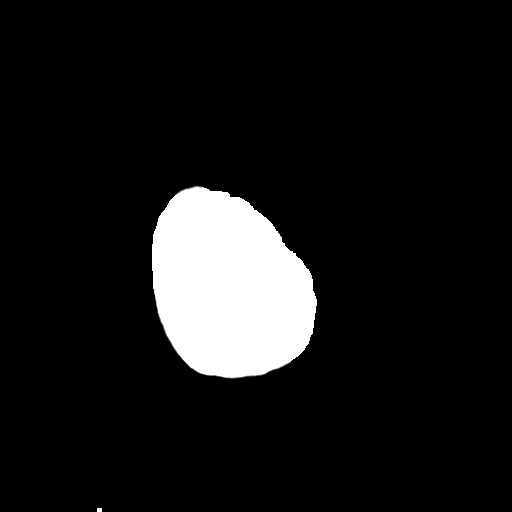
[im 31/34  bone]
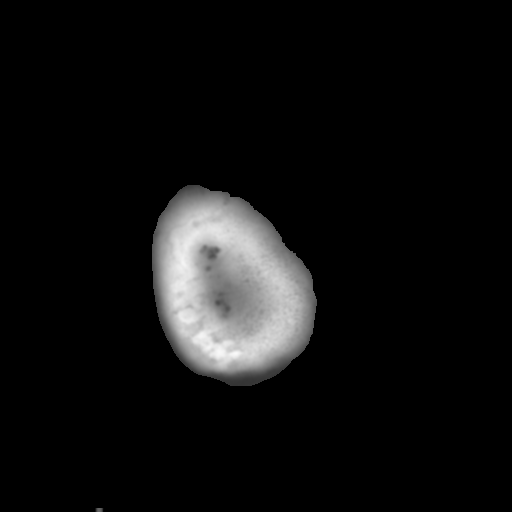

[Series 4: coronal soft tissue · coronal · 0.33mm/px · 3 of 75 slices shown]
[im 25/75  brain]
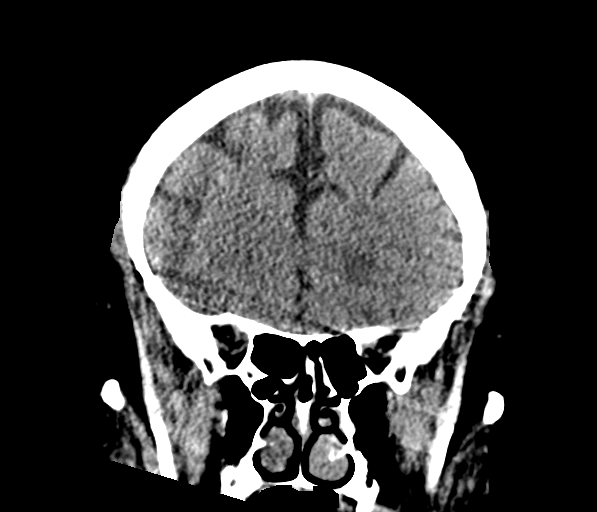
[im 33/75  brain]
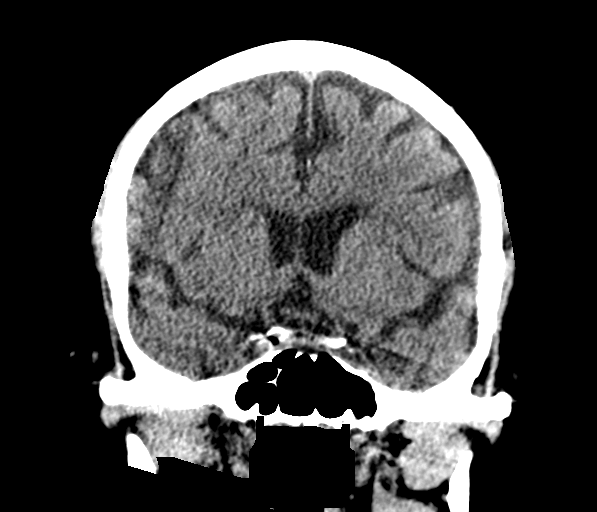
[im 42/75  brain]
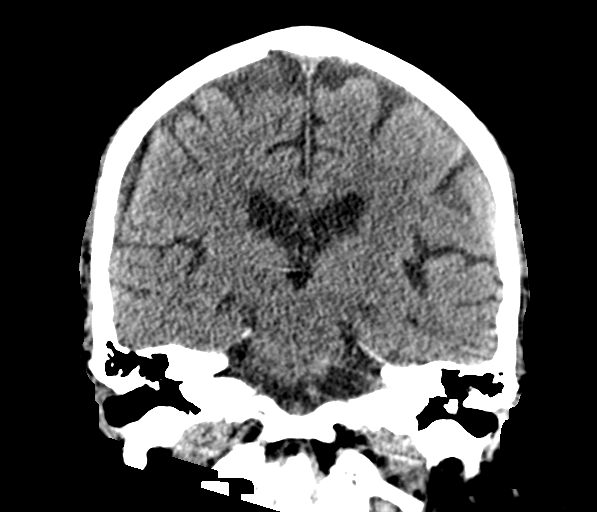

[Series 5: sagittal soft tissue · sagittal · 0.33mm/px · 3 of 66 slices shown]
[im 30/66  brain]
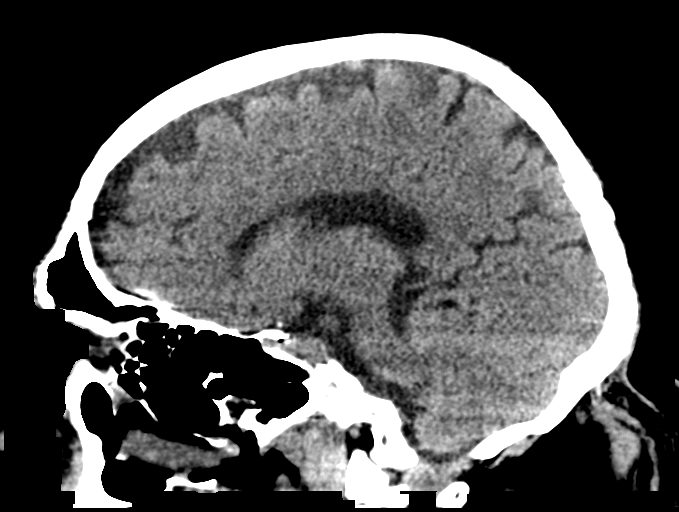
[im 33/66  brain]
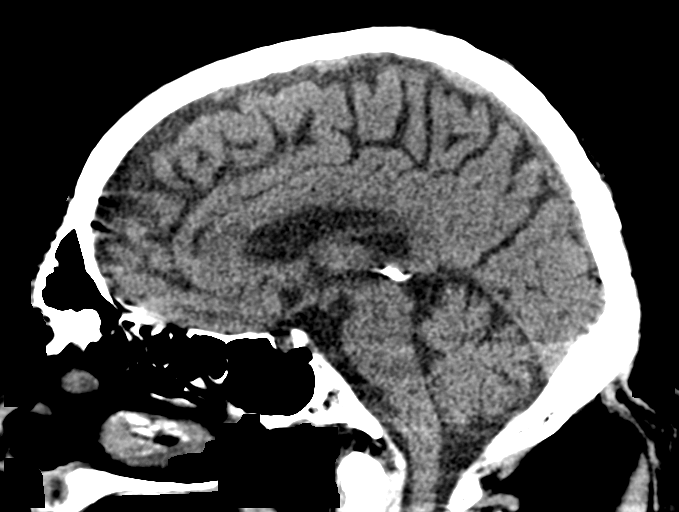
[im 37/66  brain]
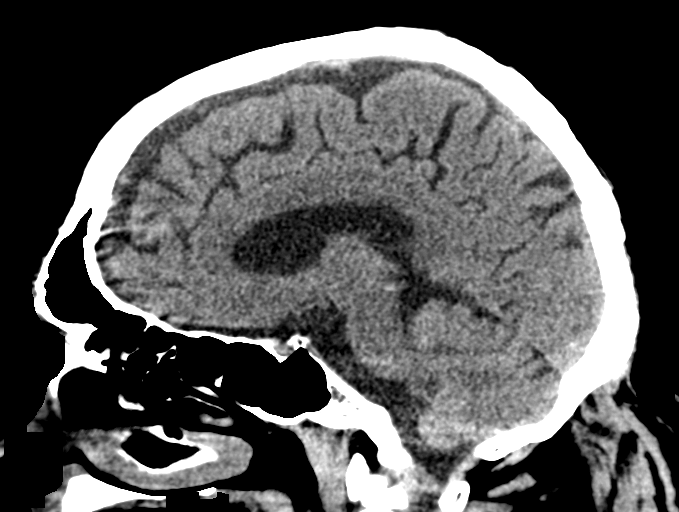

[15 of 47 positions shown; findings below may reference images not displayed]

FINDINGS: Brain: No evidence of acute infarction, hemorrhage, hydrocephalus,
extra-axial collection or mass lesion/mass effect.

Vascular: No hyperdense vessel or unexpected calcification.

Skull: Normal. Negative for fracture or focal lesion.

Sinuses/Orbits: No acute finding.

Other: None.
IMPRESSION: No acute intracranial abnormality seen.

## 2021-10-07 IMAGING — CR DG CHEST 2V
2 series · 2 of 2 positions shown · non-contrast
Comparison: [DATE]

CLINICAL DATA: Weakness

EXAM:
CHEST - 2 VIEW

[w chest lat]
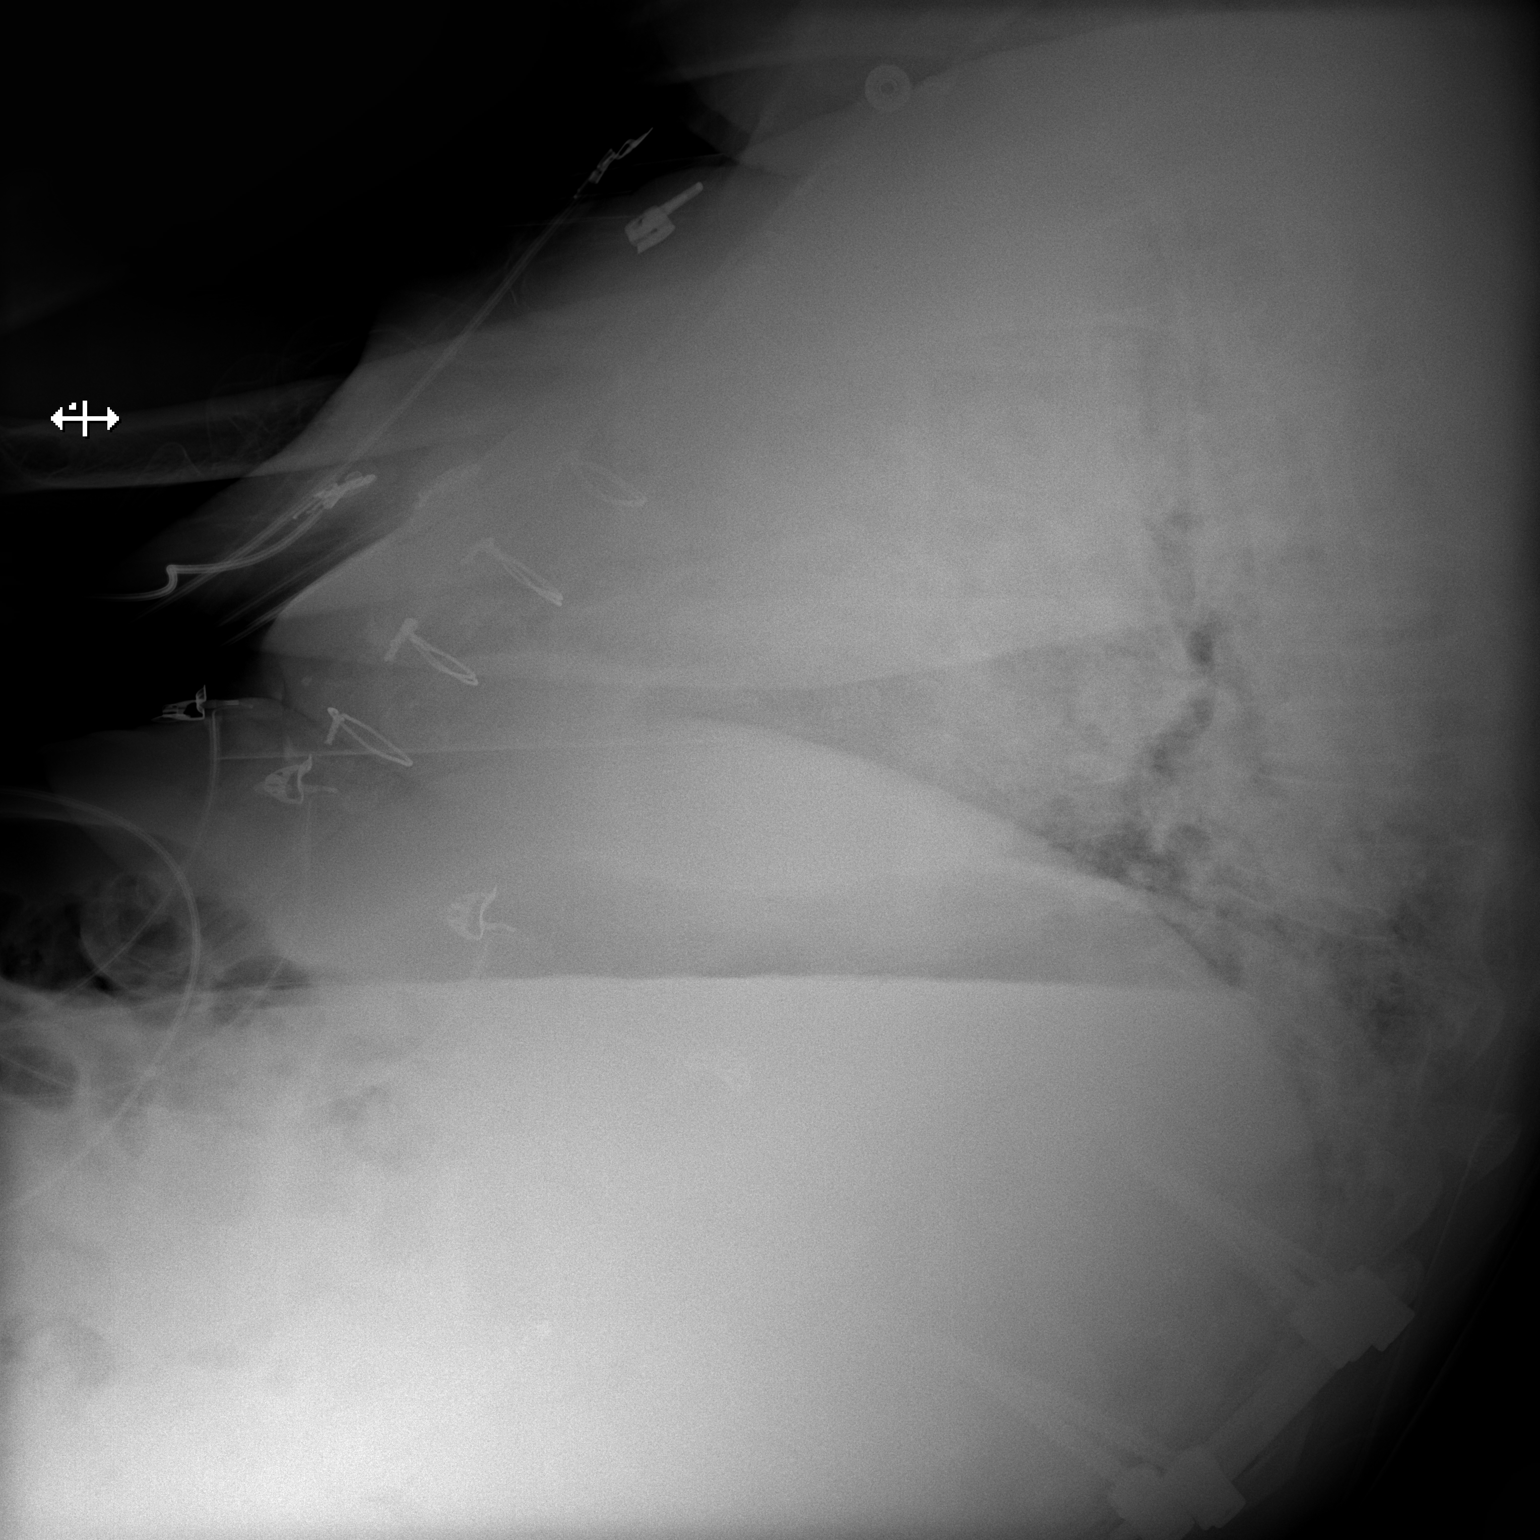

[x chest ap]
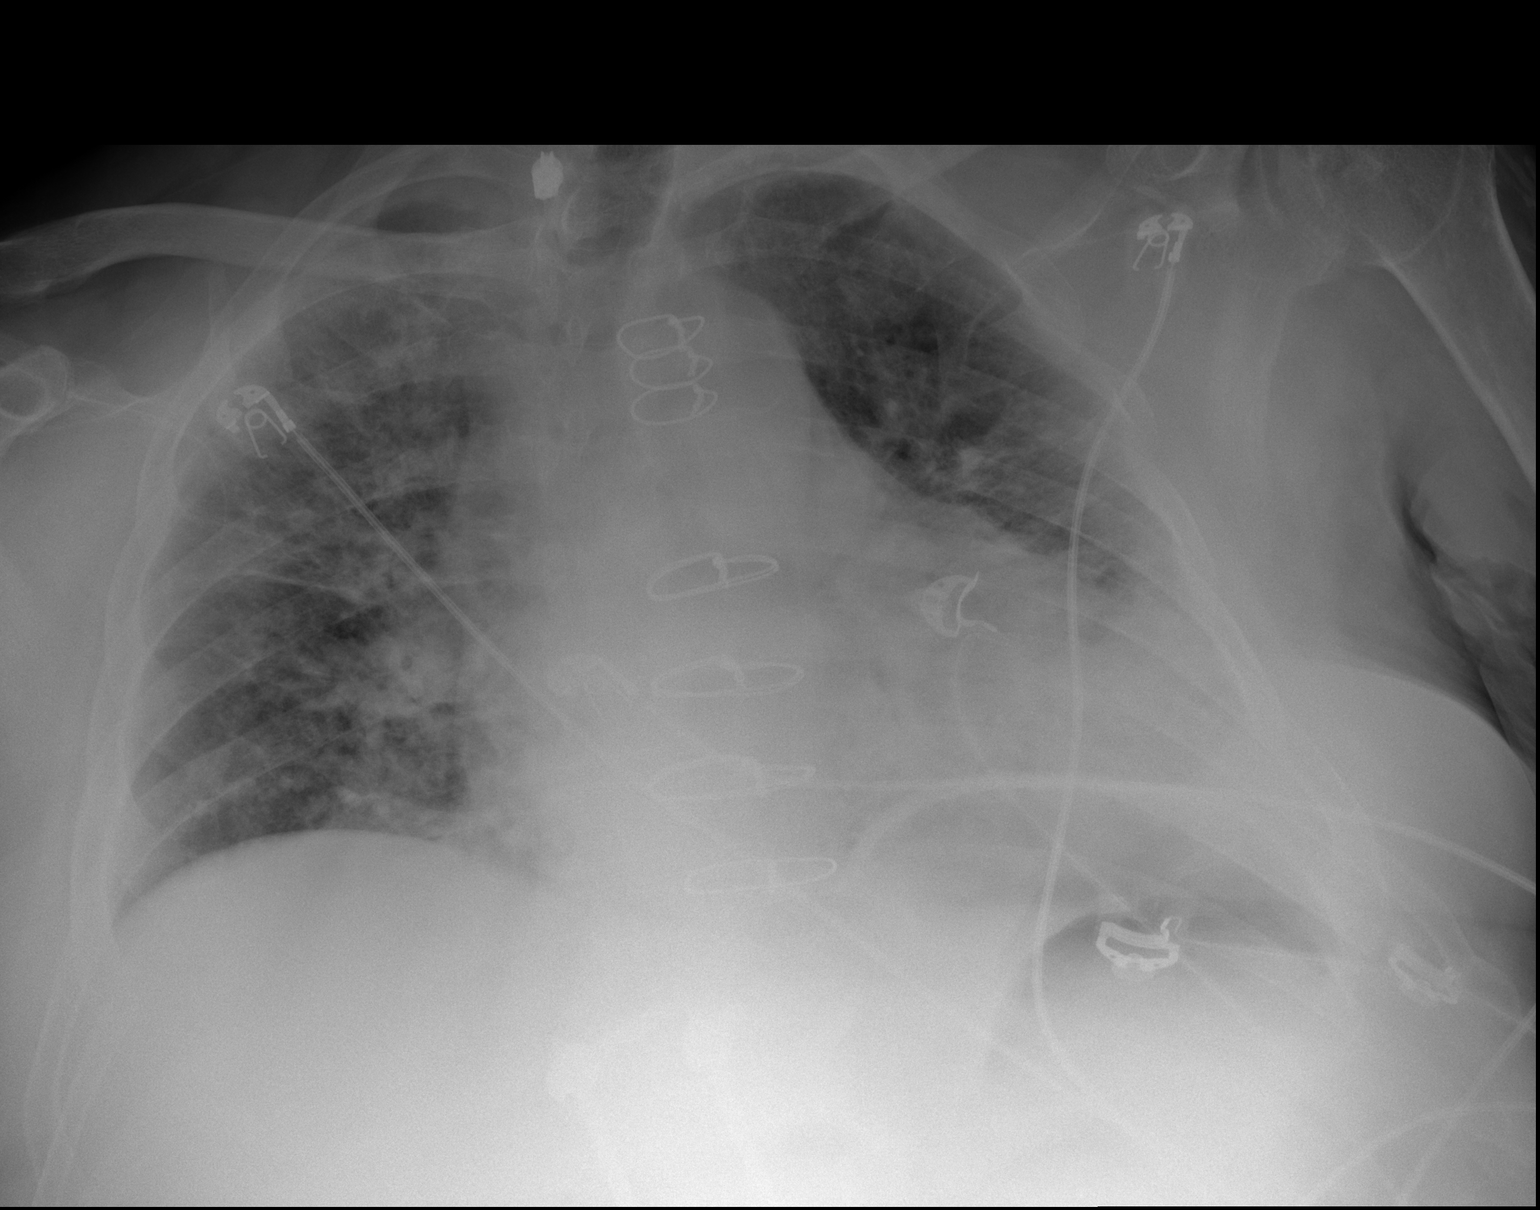

[2 of 2 positions shown; findings below may reference images not displayed]

FINDINGS: Stable cardiomegaly. Prior median sternotomy. Pulmonary vascular
congestion with diffuse bilateral interstitial prominence. No large
pleural fluid collection. No pneumothorax.
IMPRESSION: Findings suggestive of CHF with interstitial edema.

## 2021-10-07 MED ORDER — DEXTROSE 10 % IV SOLN: INTRAVENOUS | Status: DC

## 2021-10-07 MED ORDER — CLONIDINE HCL 0.3 MG/24HR TD PTWK
0.3000 mg | MEDICATED_PATCH | TRANSDERMAL | Status: DC
  Administered 2021-10-08: 0.3 mg via TRANSDERMAL
  Filled 2021-10-07: qty 1

## 2021-10-07 MED ORDER — DEXTROSE 50 % IV SOLN
1.0000 | Freq: Once | INTRAVENOUS | Status: AC
  Administered 2021-10-07: 17:00:00 50 mL via INTRAVENOUS
  Filled 2021-10-07: qty 50

## 2021-10-07 MED ORDER — METHYLPREDNISOLONE SODIUM SUCC 40 MG IJ SOLR
40.0000 mg | Freq: Once | INTRAMUSCULAR | Status: AC
  Administered 2021-10-07: 18:00:00 40 mg via INTRAVENOUS
  Filled 2021-10-07: qty 1

## 2021-10-07 MED ORDER — ONDANSETRON HCL 4 MG PO TABS
4.0000 mg | ORAL_TABLET | Freq: Four times a day (QID) | ORAL | Status: DC | PRN
  Administered 2021-10-13: 20:00:00 4 mg via ORAL
  Filled 2021-10-07: qty 1

## 2021-10-07 MED ORDER — COLCHICINE 0.6 MG PO TABS: 0.6000 mg | ORAL_TABLET | Freq: Every day | ORAL | Status: DC

## 2021-10-07 MED ORDER — ATORVASTATIN CALCIUM 40 MG PO TABS
80.0000 mg | ORAL_TABLET | Freq: Every day | ORAL | Status: DC
  Administered 2021-10-08 – 2021-10-14 (×7): 80 mg via ORAL
  Filled 2021-10-07 (×7): qty 2

## 2021-10-07 MED ORDER — DEXTROSE 10 % IV SOLN
100.0000 mL | Freq: Once | INTRAVENOUS | Status: AC
  Administered 2021-10-07: 15:00:00 100 mL via INTRAVENOUS

## 2021-10-07 MED ORDER — DEXTROSE 50 % IV SOLN
1.0000 | Freq: Once | INTRAVENOUS | Status: AC
  Administered 2021-10-07: 14:00:00 50 mL via INTRAVENOUS
  Filled 2021-10-07: qty 50

## 2021-10-07 MED ORDER — ACETAMINOPHEN 650 MG RE SUPP: 650.0000 mg | Freq: Four times a day (QID) | RECTAL | Status: DC | PRN

## 2021-10-07 MED ORDER — SODIUM CHLORIDE 0.9% FLUSH: 3.0000 mL | INTRAVENOUS | Status: DC | PRN

## 2021-10-07 MED ORDER — GABAPENTIN 300 MG PO CAPS
300.0000 mg | ORAL_CAPSULE | Freq: Three times a day (TID) | ORAL | Status: DC
  Administered 2021-10-07 – 2021-10-08 (×2): 300 mg via ORAL
  Filled 2021-10-07 (×2): qty 1

## 2021-10-07 MED ORDER — FINASTERIDE 5 MG PO TABS
5.0000 mg | ORAL_TABLET | Freq: Every day | ORAL | Status: DC
  Administered 2021-10-08 – 2021-10-14 (×7): 5 mg via ORAL
  Filled 2021-10-07 (×7): qty 1

## 2021-10-07 MED ORDER — ACETAMINOPHEN 325 MG PO TABS
650.0000 mg | ORAL_TABLET | Freq: Four times a day (QID) | ORAL | Status: DC | PRN
  Filled 2021-10-07 (×3): qty 2

## 2021-10-07 MED ORDER — HYDROMORPHONE HCL 1 MG/ML IJ SOLN
0.5000 mg | Freq: Once | INTRAMUSCULAR | Status: AC | PRN
  Administered 2021-10-07: 22:00:00 0.5 mg via INTRAVENOUS
  Filled 2021-10-07: qty 1

## 2021-10-07 MED ORDER — PANTOPRAZOLE SODIUM 40 MG PO TBEC
40.0000 mg | DELAYED_RELEASE_TABLET | Freq: Two times a day (BID) | ORAL | Status: DC
  Administered 2021-10-07 – 2021-10-14 (×14): 40 mg via ORAL
  Filled 2021-10-07 (×14): qty 1

## 2021-10-07 MED ORDER — METOPROLOL TARTRATE 25 MG PO TABS
50.0000 mg | ORAL_TABLET | Freq: Two times a day (BID) | ORAL | Status: DC
  Administered 2021-10-07 – 2021-10-14 (×14): 50 mg via ORAL
  Filled 2021-10-07 (×14): qty 2

## 2021-10-07 MED ORDER — DEXTROSE 50 % IV SOLN
1.0000 | Freq: Once | INTRAVENOUS | Status: AC
  Administered 2021-10-07: 16:00:00 50 mL via INTRAVENOUS

## 2021-10-07 MED ORDER — LACTULOSE 10 GM/15ML PO SOLN
10.0000 g | Freq: Two times a day (BID) | ORAL | Status: DC
  Administered 2021-10-07 – 2021-10-14 (×8): 10 g via ORAL
  Filled 2021-10-07 (×13): qty 30

## 2021-10-07 MED ORDER — ONDANSETRON HCL 4 MG/2ML IJ SOLN
4.0000 mg | Freq: Four times a day (QID) | INTRAMUSCULAR | Status: DC | PRN
  Administered 2021-10-07 – 2021-10-10 (×4): 4 mg via INTRAVENOUS
  Filled 2021-10-07 (×5): qty 2

## 2021-10-07 MED ORDER — APIXABAN 5 MG PO TABS
5.0000 mg | ORAL_TABLET | Freq: Two times a day (BID) | ORAL | Status: DC
  Administered 2021-10-07 – 2021-10-14 (×14): 5 mg via ORAL
  Filled 2021-10-07 (×14): qty 1

## 2021-10-07 MED ORDER — LEVOTHYROXINE SODIUM 50 MCG PO TABS
50.0000 ug | ORAL_TABLET | Freq: Every day | ORAL | Status: DC
  Administered 2021-10-08 – 2021-10-14 (×7): 50 ug via ORAL
  Filled 2021-10-07 (×7): qty 1

## 2021-10-07 MED ORDER — SODIUM CHLORIDE 0.9% FLUSH
3.0000 mL | Freq: Two times a day (BID) | INTRAVENOUS | Status: DC
  Administered 2021-10-07 – 2021-10-14 (×10): 3 mL via INTRAVENOUS

## 2021-10-07 MED ORDER — GLUCAGON HCL RDNA (DIAGNOSTIC) 1 MG IJ SOLR
1.0000 mg | Freq: Once | INTRAMUSCULAR | Status: DC
  Filled 2021-10-07: qty 1

## 2021-10-07 MED ORDER — ALLOPURINOL 100 MG PO TABS
100.0000 mg | ORAL_TABLET | Freq: Every day | ORAL | Status: DC
  Administered 2021-10-08 – 2021-10-14 (×7): 100 mg via ORAL
  Filled 2021-10-07 (×7): qty 1

## 2021-10-07 MED ORDER — HYDRALAZINE HCL 50 MG PO TABS
100.0000 mg | ORAL_TABLET | Freq: Three times a day (TID) | ORAL | Status: DC
  Administered 2021-10-07 – 2021-10-14 (×20): 100 mg via ORAL
  Filled 2021-10-07 (×21): qty 2

## 2021-10-07 MED ORDER — TAMSULOSIN HCL 0.4 MG PO CAPS
0.4000 mg | ORAL_CAPSULE | Freq: Every day | ORAL | Status: DC
  Administered 2021-10-08 – 2021-10-14 (×7): 0.4 mg via ORAL
  Filled 2021-10-07 (×7): qty 1

## 2021-10-07 MED ORDER — POLYETHYLENE GLYCOL 3350 17 G PO PACK: 17.0000 g | PACK | Freq: Every day | ORAL | Status: DC | PRN

## 2021-10-07 MED ORDER — SODIUM CHLORIDE 0.9 % IV SOLN: 250.0000 mL | INTRAVENOUS | Status: DC | PRN

## 2021-10-07 MED ORDER — AMLODIPINE BESYLATE 5 MG PO TABS
25.0000 mg | ORAL_TABLET | Freq: Every day | ORAL | Status: DC
  Administered 2021-10-08 – 2021-10-14 (×7): 25 mg via ORAL
  Filled 2021-10-07 (×8): qty 5

## 2021-10-07 MED ORDER — SACCHAROMYCES BOULARDII 250 MG PO CAPS
250.0000 mg | ORAL_CAPSULE | Freq: Two times a day (BID) | ORAL | Status: DC
  Administered 2021-10-07 – 2021-10-14 (×13): 250 mg via ORAL
  Filled 2021-10-07 (×14): qty 1

## 2021-10-07 NOTE — ED Provider Notes (Signed)
[facility] DEPT Provider Note   CSN: [id number] Arrival date & time: 10/07/21  1246     History Chief Complaint  Patient presents with   Hypoglycemia    Thomas [name] is a 70 y.o. male.  HPI  Patient with history of hypertension, diabetes, CKD, recent hospitalization for CAP/AKI presents due to altered mental status and hypoglycemia.  Level 5 caveat applies secondary to altered mental status.  Per the patient's brother the patient was at baseline mental status after being discharged in the hospital 2 weeks ago.  Starting last night the patient started acting differently, they say his voice was little bit slurred and he was more confused and disoriented.  This was before taking the blood sugar medicine.  He is currently on glipizide and metformin.  Patient had a home health nurse come by this morning, patient was altered at that point.  Complaining of a headache, denies any other symptoms other than feeling off.  Past Medical History:  Diagnosis Date   Arthritis    BPH (benign prostatic hyperplasia) 11/24/2019   Chronic pain syndrome 11/24/2019   CKD (chronic kidney disease), stage IV ([place]) 11/24/2019   Diabetes mellitus without complication (HCC)    Gout    Hypertension    Muscle pain    Physical deconditioning 11/24/2019   Scars    Swelling     Patient Active Problem List   Diagnosis Date Noted   ARF (acute renal failure) ([place]) 09/17/2021   CAP (community acquired pneumonia) 09/17/2021   Cellulitis of left lower extremity    Pressure injury of skin 06/03/2021   AKI (acute kidney injury) ([place]) 06/02/2021   Pain management    Cord compression (HCC)    Drug-induced constipation    Acute midline low back pain with sciatica    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 ([place]) 08/30/2020   Urinary retention 12/04/2019   BPH (benign prostatic hyperplasia) 11/24/2019   CKD (chronic kidney disease), stage IV ([place]) 11/24/2019    Chronic pain syndrome 11/24/2019   Physical deconditioning 11/24/2019   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   Pulmonary emboli ([place]) 09/29/2018   [name], 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   [name], type 2) ([place]) 09/29/2018   Gout 09/29/2018   HTN (hypertension) 09/29/2018   CHF (congestive heart failure), NYHA class I, chronic, diastolic ([place]) 66/44/0347   GERD (gastroesophageal reflux disease) 09/29/2018    Past Surgical History:  Procedure Laterality Date   BELOW KNEE LEG AMPUTATION Right    ESOPHAGOGASTRODUODENOSCOPY (EGD) WITH PROPOFOL N/A 11/25/2019   Procedure: ESOPHAGOGASTRODUODENOSCOPY (EGD) WITH PROPOFOL;  Surgeon: Carol Ada, MD;  Location: [facility];  Service: Endoscopy;  Laterality: N/A;   IR FLUORO GUIDE CV LINE RIGHT  09/05/2019   IR LUMBAR DISC ASPIRATION W/IMG GUIDE  11/26/2019   IR US GUIDE VASC ACCESS RIGHT  09/05/2019   RADIOLOGY WITH ANESTHESIA N/A 11/25/2019   Procedure: Thoracic and Lumbar MRI WITH ANESTHESIA;  Surgeon: Radiologist, Medication, MD;  Location: [facility];  Service: Radiology;  Laterality: N/A;       Family History  Family history unknown: Yes    Social History   Tobacco Use   Smoking status: Never    Passive exposure: Never   Smokeless tobacco: Never  Vaping Use   Vaping Use: Never used  Substance Use Topics   Alcohol use: No   Drug use: No    Home Medications Prior to Admission medications   Medication  Sig Start Date End Date Taking? Authorizing Provider  acetaminophen (TYLENOL) 500 MG tablet Take 2 tablets (1,000 mg total) by mouth every 8 (eight) hours. Patient taking differently: Take 1,000 mg by mouth every 8 (eight) hours as needed for moderate pain. 10/03/20   Jeralyn Bennett, MD  allopurinol (ZYLOPRIM) 100 MG tablet Take 100 mg by mouth daily.     [provider]  amLODipine (NORVASC) 5 MG tablet Take 5 tablets by mouth daily.    [provider]   apixaban (ELIQUIS) 5 MG TABS tablet Take 1 tablet by mouth 2 times daily. 10/22/21 01/20/22  Kayleen Memos, DO  atorvastatin (LIPITOR) 80 MG tablet Take 1 tablet (80 mg total) by mouth daily. 09/25/20   Jose Persia, MD  cloNIDine (CATAPRES - DOSED IN MG/24 HR) 0.3 mg/24hr patch Place 1 patch onto the skin once a week. 10/01/21 10/31/21  Kayleen Memos, DO  colchicine 0.6 MG tablet Take 0.6 mg by mouth daily. 08/21/21   [provider]  cyanocobalamin 100 MCG tablet Take 2 tablets by mouth daily. 10/01/21 10/31/21  Kayleen Memos, DO  ferrous sulfate 325 (65 FE) MG tablet Take 1 tablet by mouth daily with breakfast. 10/01/21 10/31/21  Kayleen Memos, DO  finasteride (PROSCAR) 5 MG tablet Take 1 tablet (5 mg total) by mouth daily. 10/10/19   Hosie Poisson, MD  gabapentin (NEURONTIN) 300 MG capsule Take 1 capsule (300 mg total) by mouth at bedtime. Patient taking differently: Take 300 mg by mouth every 8 (eight) hours. 06/06/21   Nita Sells, MD  glipiZIDE (GLUCOTROL) 10 MG tablet Take 10 mg by mouth daily before breakfast.    [provider]  hydrALAZINE (APRESOLINE) 100 MG tablet Take 1 tablet by mouth every 8 hours. 10/01/21 10/31/21  Kayleen Memos, DO  lactulose (CHRONULAC) 10 GM/15ML solution Take 15 mLs (10 g total) by mouth 2 (two) times daily. 09/24/20   Jose Persia, MD  levothyroxine (SYNTHROID) 50 MCG tablet Take 50 mcg by mouth daily before breakfast.     [provider]  methocarbamol (ROBAXIN) 750 MG tablet Take 1 tablet (750 mg total) by mouth every 8 (eight) hours as needed for muscle spasms. 06/06/21   Nita Sells, MD  metoprolol tartrate (LOPRESSOR) 50 MG tablet Take 1 tablet by mouth 2 times daily. 10/01/21 10/31/21  Kayleen Memos, DO  naloxegol oxalate (MOVANTIK) 12.5 MG TABS tablet Take 1 tablet by mouth daily. 10/01/21 10/31/21  Kayleen Memos, DO  OXYCONTIN 30 MG 12 hr tablet Take 1 tablet (30 mg total) by mouth 2 (two) times  daily. Patient taking differently: Take 30 mg by mouth every 8 (eight) hours. 06/06/21   Nita Sells, MD  pantoprazole (PROTONIX) 40 MG tablet Take 1 tablet by mouth 2 times daily. 10/01/21 10/31/21  Kayleen Memos, DO  polyethylene glycol (MIRALAX) 17 g packet Take 17 g by mouth daily as needed. 10/01/21   Kayleen Memos, DO  saccharomyces boulardii (FLORASTOR) 250 MG capsule Take 1 capsule by mouth 2 times daily. 10/01/21 10/31/21  Kayleen Memos, DO  tamsulosin (FLOMAX) 0.4 MG CAPS capsule Take 1 capsule (0.4 mg total) by mouth daily. 09/25/20   Jose Persia, MD    Allergies    Other, Oxymorphone, Aspirin, Beta vulgaris, Buspirone, Cabbage, Codeine, Dolobid [diflunisal], Fish allergy, Fish-derived products, Methadone, Pentazocine, Propoxyphene, Shellfish allergy, Sulfa antibiotics, Sulfasalazine, Vistaril [hydroxyzine], and Amoxicillin  Review of Systems   Review of Systems  Unable to perform ROS:  Dementia   Physical Exam Updated Vital Signs BP (!) 180/74    Pulse 67    Temp 98.2 F (36.8 C)    Resp 12    SpO2 100%   Physical Exam Vitals and nursing note reviewed. Exam conducted with a chaperone present.  Constitutional:      Appearance: Normal appearance.     Comments: Ill-appearing but not in acute distress  HENT:     Head: Normocephalic and atraumatic.     Mouth/Throat:     Mouth: Mucous membranes are dry.  Eyes:     General: No scleral icterus.       Right eye: No discharge.        Left eye: No discharge.     Extraocular Movements: Extraocular movements intact.     Pupils: Pupils are equal, round, and reactive to light.  Cardiovascular:     Rate and Rhythm: Normal rate and regular rhythm.     Pulses: Normal pulses.     Heart sounds: Normal heart sounds. No murmur heard.   No friction rub. No gallop.  Pulmonary:     Effort: Pulmonary effort is normal. No respiratory distress.     Breath sounds: Normal breath sounds.  Abdominal:     General: Abdomen is flat.  Bowel sounds are normal. There is no distension.     Palpations: Abdomen is soft.     Tenderness: There is abdominal tenderness.     Comments: Diffuse tenderness, protuberant abdomen  Musculoskeletal:     Comments: Status post right BKA.  Edema to the left lower extremity up to the knee, pitting  Skin:    General: Skin is warm and dry.     Coloration: Skin is not jaundiced.  Neurological:     Mental Status: He is alert. Mental status is at baseline. He is disoriented.    ED Results / Procedures / Treatments   Labs (all labs ordered are listed, but only abnormal results are displayed) Labs Reviewed  CBC - Abnormal; Notable for the following components:      Result Value   RBC 2.81 (*)    Hemoglobin 8.6 (*)    HCT 28.2 (*)    MCV 100.4 (*)    All other components within normal limits  CBG MONITORING, ED - Abnormal; Notable for the following components:   Glucose-Capillary 47 (*)    All other components within normal limits  CULTURE, BLOOD (ROUTINE X 2)  CULTURE, BLOOD (ROUTINE X 2)  RESP PANEL BY RT-PCR (FLU A&B, COVID) ARPGX2  BASIC METABOLIC PANEL  URINALYSIS, ROUTINE W REFLEX MICROSCOPIC  LACTIC ACID, PLASMA  LACTIC ACID, PLASMA  CBG MONITORING, ED    EKG None  Radiology No results found.  Procedures .Critical Care Performed by: Sherrill Raring, PA-C Authorized by: Sherrill Raring, PA-C   Critical care provider statement:    Critical care time (minutes):  30   Critical care start time:  10/07/2021 2:00 AM   Critical care end time:  10/07/2021 3:17 PM   Critical care time was exclusive of:  Separately billable procedures and treating other patients   Critical care was necessary to treat or prevent imminent or life-threatening deterioration of the following conditions:  CNS failure or compromise, endocrine crisis, metabolic crisis and renal failure   Critical care was time spent personally by me on the following activities:  Development of treatment plan with patient or  surrogate, discussions with consultants, evaluation of patient's response to treatment, examination of patient, ordering  and review of laboratory studies, ordering and review of radiographic studies, ordering and performing treatments and interventions, pulse oximetry, re-evaluation of patient's condition and review of old charts   Care discussed with: admitting provider     Medications Ordered in ED Medications  sodium chloride flush (NS) 0.9 % injection 3 mL (3 mLs Intravenous Given 10/07/21 1320)  sodium chloride flush (NS) 0.9 % injection 3 mL (has no administration in time range)  0.9 %  sodium chloride infusion (has no administration in time range)  dextrose 50 % solution 50 mL (has no administration in time range)    ED Course  I have reviewed the triage vital signs and the nursing notes.  Pertinent labs & imaging results that were available during my care of the patient were reviewed by me and considered in my medical decision making (see chart for details).  Clinical Course as of 10/07/21 1515  Mon Oct 07, 2021  1454 Creatinine(!): 4.64 [DR]    Clinical Course User Index [DR] Pattricia Boss, MD   MDM Rules/Calculators/A&P                         Patient is altered.  Blood pressure stable although he significantly hypoglycemic.  D50 given, will continue to monitor.  Patient creatinine is elevated, AKI compared to baseline.  He is also hypothermic likely secondary to the hypoglycemia.  CTA negative, doubt contributory to his current presentation.  He is not currently hypoxic or tachycardic, he does have an elevated lactic acid at 2 so we will check a second.  Radiograph notable for CHF but his presentation is not typical with exacerbation.  Also denies any chest pain or shortness of breath.  Patient will need admission for hypoglycemia, hypothermia, worsening kidney function.  Spoke with Dr. Olevia Bowens who is in agreement.  I appreciate his consult and the coordination of care of this  patient.    Discussed HPI, physical exam and plan of care for this patient with attending Pattricia Boss. The attending physician evaluated this patient as part of a shared visit and agrees with plan of care.  Final Clinical Impression(s) / ED Diagnoses Final diagnoses:  None    Rx / DC Orders ED Discharge Orders     None        Sherrill Raring, [place] 10/07/21 1517    Pattricia Boss, MD 10/15/21 1549

## 2021-10-07 NOTE — ED Notes (Signed)
Bair hugger placed on pt at highest temp.

## 2021-10-07 NOTE — ED Notes (Signed)
Lactic 2.0

## 2021-10-07 NOTE — ED Notes (Signed)
Pt given cracker and orange juice to increase CBG

## 2021-10-07 NOTE — H&P (Signed)
History and Physical    Thomas [name]:[id number] DOB: December 08, 1950 DOA: 10/07/2021  PCP: Antonietta Jewel, MD   Patient coming from: Home.  I have personally briefly reviewed patient's old medical records in [place]  Chief Complaint: Altered mental status.  HPI: Thomas [name] is a 70 y.o. male with medical history significant of osteoarthritis, BPH, chronic pain syndrome, stage IV CKD, type II DM, gout, hypertension, physical deconditioning, history of PE and LLE DVT, right BKA, chronic systolic CHF, discharged from the hospital 6 days ago when he was admitted due to AKI in the setting of right kidney hydronephrosis,, acute respiratory failure with hypoxia in the setting of left lower lobe pneumonia, hypertensive emergency, also presenting with altered mental status but in the setting of hypoglycemia.  When seen, the patient was somnolent but woke up and answer some questions.  He was oriented to person, place, knew he was hypoglycemic and was oriented to month and year.  He has been taking his medications as instructed.  He denied chest pain, headache or abdominal pain.  Stated he was cold and has had diaphoresis.  ED Course: Initial vital signs were temperature 98.2 F, pulse 67, respiration 12, BP 180/74 and O2 sat 100% on room air.  The patient was started on dextrose supplementation in the emergency department.  I ordered 40 mg of methylprednisolone IVP x1.  Lab work: His urinalysis showed proteinuria 100 mg/dL, small leukocyte esterase, 21-50 WBC with rare bacteria.  CBC with a white count 6.7, hemoglobin 8.6 g/dL and platelets 200.  Sodium 134, potassium 4.3, chloride 104 and CO2 18 mmol/L.  Glucose was 40 mg/dL.  BUN was 80 and creatinine 4.64 mg/dL.  Lactic acid was 2.0 and then 1.9 mmol/L.  Review of Systems: As per HPI otherwise all other systems reviewed and are negative.  Past Medical History:  Diagnosis Date   Arthritis    BPH (benign prostatic hyperplasia) 11/24/2019    Chronic pain syndrome 11/24/2019   CKD (chronic kidney disease), stage IV ([place]) 11/24/2019   Diabetes mellitus without complication (HCC)    Gout    Hypertension    Muscle pain    Physical deconditioning 11/24/2019   Scars    Swelling    Past Surgical History:  Procedure Laterality Date   BELOW KNEE LEG AMPUTATION Right    ESOPHAGOGASTRODUODENOSCOPY (EGD) WITH PROPOFOL N/A 11/25/2019   Procedure: ESOPHAGOGASTRODUODENOSCOPY (EGD) WITH PROPOFOL;  Surgeon: Carol Ada, MD;  Location: [facility];  Service: Endoscopy;  Laterality: N/A;   IR FLUORO GUIDE CV LINE RIGHT  09/05/2019   IR LUMBAR DISC ASPIRATION W/IMG GUIDE  11/26/2019   IR US GUIDE VASC ACCESS RIGHT  09/05/2019   RADIOLOGY WITH ANESTHESIA N/A 11/25/2019   Procedure: Thoracic and Lumbar MRI WITH ANESTHESIA;  Surgeon: Radiologist, Medication, MD;  Location: [facility];  Service: Radiology;  Laterality: N/A;   Social History  reports that he has never smoked. He has never been exposed to tobacco smoke. He has never used smokeless tobacco. He reports that he does not drink alcohol and does not use drugs.  Allergies  Allergen Reactions   Other Other (See Comments)    Blood pressure issues Per [facility] hospital: Pt states he can only take these pain meds or else he gets very sick: Oxycontin,morphine,demerol, and dilaudid are the only pain meds pt states he can take.     Oxymorphone Other (See Comments)    Causes kidney problems   Aspirin Nausea And Vomiting  Per [facility]   Beta Vulgaris Nausea And Vomiting    Beets   Buspirone Other (See Comments)    Unknown reaction   Cabbage Nausea And Vomiting   Codeine Other (See Comments)    unknown   Dolobid [Diflunisal]     Unknown reaction   Fish Allergy Nausea And Vomiting   Fish-Derived Products Nausea And Vomiting   Methadone     Unknown reaction   Pentazocine Other (See Comments)    Unknown reaction   Propoxyphene Other (See Comments)    Unknown reaction   Shellfish  Allergy Nausea And Vomiting   Sulfa Antibiotics Hives   Sulfasalazine Other (See Comments)    Unknown reaction   Vistaril [Hydroxyzine]     Unknown reaction   Amoxicillin Rash    Per [facility]   Family History  Family history unknown: Yes   Prior to Admission medications   Medication Sig Start Date End Date Taking? Authorizing Provider  acetaminophen (TYLENOL) 500 MG tablet Take 2 tablets (1,000 mg total) by mouth every 8 (eight) hours. Patient taking differently: Take 1,000 mg by mouth every 8 (eight) hours as needed for moderate pain. 10/03/20  Yes Jeralyn Bennett, MD  allopurinol (ZYLOPRIM) 100 MG tablet Take 100 mg by mouth daily.    Yes [provider]  amLODipine (NORVASC) 5 MG tablet Take 5 tablets by mouth daily.   Yes [provider]  apixaban (ELIQUIS) 5 MG TABS tablet Take 1 tablet by mouth 2 times daily. 10/22/21 01/20/22 Yes Hall, Carole N, DO  atorvastatin (LIPITOR) 80 MG tablet Take 1 tablet (80 mg total) by mouth daily. 09/25/20  Yes Jose Persia, MD  colchicine 0.6 MG tablet Take 0.6 mg by mouth daily. 08/21/21  Yes [provider]  cyanocobalamin 100 MCG tablet Take 2 tablets by mouth daily. 10/01/21 10/31/21 Yes Kayleen Memos, DO  ferrous sulfate 325 (65 FE) MG tablet Take 1 tablet by mouth daily with breakfast. 10/01/21 10/31/21 Yes Hall, Carole N, DO  finasteride (PROSCAR) 5 MG tablet Take 1 tablet (5 mg total) by mouth daily. 10/10/19  Yes Hosie Poisson, MD  gabapentin (NEURONTIN) 300 MG capsule Take 1 capsule (300 mg total) by mouth at bedtime. Patient taking differently: Take 300 mg by mouth every 8 (eight) hours. 06/06/21  Yes Nita Sells, MD  glipiZIDE (GLUCOTROL) 10 MG tablet Take 10 mg by mouth daily before breakfast.   Yes [provider]  hydrALAZINE (APRESOLINE) 100 MG tablet Take 1 tablet by mouth every 8 hours. 10/01/21 10/31/21 Yes Kayleen Memos, DO  lactulose (CHRONULAC) 10 GM/15ML solution Take 15 mLs (10  g total) by mouth 2 (two) times daily. 09/24/20  Yes Jose Persia, MD  levothyroxine (SYNTHROID) 50 MCG tablet Take 50 mcg by mouth daily before breakfast.    Yes [provider]  methocarbamol (ROBAXIN) 750 MG tablet Take 1 tablet (750 mg total) by mouth every 8 (eight) hours as needed for muscle spasms. 06/06/21  Yes Nita Sells, MD  metoprolol tartrate (LOPRESSOR) 50 MG tablet Take 1 tablet by mouth 2 times daily. 10/01/21 10/31/21 Yes Hall, Carole N, DO  OXYCONTIN 30 MG 12 hr tablet Take 1 tablet (30 mg total) by mouth 2 (two) times daily. Patient taking differently: Take 30 mg by mouth every 8 (eight) hours. 06/06/21  Yes Nita Sells, MD  pantoprazole (PROTONIX) 40 MG tablet Take 1 tablet by mouth 2 times daily. 10/01/21 10/31/21 Yes Hall, Carole N, DO  polyethylene glycol (MIRALAX)  17 g packet Take 17 g by mouth daily as needed. 10/01/21  Yes Kayleen Memos, DO  tamsulosin (FLOMAX) 0.4 MG CAPS capsule Take 1 capsule (0.4 mg total) by mouth daily. 09/25/20  Yes Jose Persia, MD  cloNIDine (CATAPRES - DOSED IN MG/24 HR) 0.3 mg/24hr patch Place 1 patch onto the skin once a week. 10/01/21 10/31/21  Kayleen Memos, DO  naloxegol oxalate (MOVANTIK) 12.5 MG TABS tablet Take 1 tablet by mouth daily. 10/01/21 10/31/21  Kayleen Memos, DO  saccharomyces boulardii (FLORASTOR) 250 MG capsule Take 1 capsule by mouth 2 times daily. 10/01/21 10/31/21  Kayleen Memos, DO    Physical Exam: Vitals:   10/07/21 1350 10/07/21 1400 10/07/21 1433 10/07/21 1505  BP: (!) 185/84 (!) 185/84 (!) 172/75 (!) 157/68  Pulse: 74  61 69  Resp: 14  19 14   Temp:    (!) 95.6 F (35.3 C)  TempSrc:    Rectal  SpO2: 95%  100% 100%    Constitutional: NAD, calm, comfortable Eyes: Chronically ill-appearing.  PERRL, lids and conjunctivae normal ENMT: Mucous membranes are dry.  Posterior pharynx clear of any exudate or lesions. Neck: normal, supple, no masses, no thyromegaly Respiratory: clear to  auscultation bilaterally, no wheezing, no crackles. Normal respiratory effort. No accessory muscle use.  Cardiovascular: Regular rate and rhythm, no murmurs / rubs / gallops..  3+ lower extremity edema. 2+ pedal pulses. No carotid bruits.  Abdomen: Obese, no distention.  Soft, no tenderness, no masses palpated. No hepatosplenomegaly. Bowel sounds positive.  Musculoskeletal: Left BKA.  Severe generalized weakness.  No clubbing / cyanosis. Good ROM, no contractures. Normal muscle tone.  Skin: Left pretibial dermatitis. Neurologic: CN 2-12 grossly intact. Sensation intact, DTR normal. Strength 5/5 in all 4.  Psychiatric: Normal judgment and insight. Alert and oriented x 3. Normal mood.   Labs on Admission: I have personally reviewed following labs and imaging studies  CBC: Recent Labs  Lab 10/01/21 0512 10/07/21 1250  WBC 6.7 6.7  NEUTROABS 3.8  --   HGB 9.2* 8.6*  HCT 29.3* 28.2*  MCV 102.8* 100.4*  PLT 258 366   Basic Metabolic Panel: Recent Labs  Lab 10/07/21 1250  NA 134*  K 4.3  CL 104  CO2 18*  GLUCOSE 40*  BUN 80*  CREATININE 4.64*  CALCIUM 7.0*   GFR: Estimated Creatinine Clearance: 22.4 mL/min (A) (by C-G formula based on SCr of 4.64 mg/dL (H)).  Liver Function Tests: No results for input(s): AST, ALT, ALKPHOS, BILITOT, PROT, ALBUMIN in the last 168 hours.  Urine analysis:    Component Value Date/Time   COLORURINE STRAW (A) 10/07/2021 1350   APPEARANCEUR HAZY (A) 10/07/2021 1350   LABSPEC 1.009 10/07/2021 1350   PHURINE 5.0 10/07/2021 1350   GLUCOSEU NEGATIVE 10/07/2021 1350   HGBUR NEGATIVE 10/07/2021 [place] 10/07/2021 1350   [place] 10/07/2021 1350   PROTEINUR 100 (A) 10/07/2021 1350   NITRITE NEGATIVE 10/07/2021 1350   LEUKOCYTESUR SMALL (A) 10/07/2021 1350   Radiological Exams on Admission: DG Chest 2 View  Result Date: 10/07/2021 CLINICAL DATA:  Weakness EXAM: CHEST - 2 VIEW COMPARISON:  09/23/2021 FINDINGS: Stable  cardiomegaly. Prior median sternotomy. Pulmonary vascular congestion with diffuse bilateral interstitial prominence. No large pleural fluid collection. No pneumothorax. IMPRESSION: Findings suggestive of CHF with interstitial edema. Electronically Signed   By: Davina Poke D.O.   On: 10/07/2021 14:19   CT Head Wo Contrast  Result Date: 10/07/2021 CLINICAL DATA:  Altered mental status. EXAM: CT HEAD WITHOUT CONTRAST TECHNIQUE: Contiguous axial images were obtained from the base of the skull through the vertex without intravenous contrast. COMPARISON:  September 23, 2021. FINDINGS: Brain: No evidence of acute infarction, hemorrhage, hydrocephalus, extra-axial collection or mass lesion/mass effect. Vascular: No hyperdense vessel or unexpected calcification. Skull: Normal. Negative for fracture or focal lesion. Sinuses/Orbits: No acute finding. Other: None. IMPRESSION: No acute intracranial abnormality seen. Electronically Signed   By: Marijo Conception M.D.   On: 10/07/2021 14:27    EKG: Independently reviewed.  Vent. rate 70 BPM PR interval 212 ms QRS duration 119 ms QT/QTcB 463/500 ms P-R-T axes 18 29 84 Sinus rhythm Borderline prolonged PR interval Nonspecific intraventricular conduction delay Nonspecific T abnormalities, lateral leads  Assessment/Plan Principal Problem:   Hypoglycemia In the patient with a history of   [name], type 2) (HCC) Likely from decreased clearance of sulfonylurea. Hold glipizide. Consider adjusting down or discontinuing. Encourage oral intake. Continue dextrose infusion. Methylprednisolone given earlier. Glucagon 1 mg IVP. Monitor CBG closely.  Active Problems:   AKI (acute kidney injury) ([place]) Superimposed on   CKD (chronic kidney disease), stage IV (HCC) Continue IV fluids. Avoid nephrotoxins. Avoid hypotension. Monitor intake and output. Follow-up renal function and electrolytes.    Pulmonary emboli (HCC) Continue  apixaban.    Gout Continue allopurinol.    HTN (hypertension) Continue amlodipine 5 mg p.o. daily. Continue hydralazine 100 mg p.o. every 8 hours. Continue Catapres patch. Continue metoprolol 50 mg p.o. twice daily.    GERD (gastroesophageal reflux disease) Continue PPI.    DVT prophylaxis:  Code Status:   Full code. Family Communication:   Disposition Plan:   Patient is from:  Home.  Anticipated DC to:  Home.  Anticipated DC date:  10/09/2021.  Anticipated DC barriers: Clinical status.  Consults called:   Admission status:  Observation/PCU.   Severity of Illness: High severity in the setting of hypoglycemia with acute kidney injury.  Reubin Milan MD [facility]  How to contact the [facility] Attending or Consulting provider [place] or covering provider during after hours [place], for this patient?   Check the care team in [facility] and look for a) attending/consulting [facility] provider listed and b) the [facility] team listed Log into [email or web address] and use [place]'s universal password to access. If you do not have the password, please contact the hospital operator. Locate the [facility] provider you are looking for under [facility] and page to a number that you can be directly reached. If you still have difficulty reaching the provider, please page the [facility] (Director on Call) for the Hospitalists listed on amion for assistance.  10/07/2021, 3:51 PM   This document was prepared using Dragon voice recognition software and may contain some unintended transcription errors.

## 2021-10-07 NOTE — ED Triage Notes (Signed)
BIBA Per EMS: Pt c/o hypoglycemia. CBG at 65 upon arrival. Given oral glucose - 88 CBG.  Oxycodone usage & somewhat disoriented.  20 R hand  96%  142/78  70 HR  97.9 temp

## 2021-10-07 NOTE — ED Notes (Signed)
Pt given meal tray which consisted of chicken, green beans, rice, salad, and fruit cup Pt stated that he could not eat the chicken, rice, and green beans because "they just won't stay down" This writer attempted to obtain further explanation of what pt meant but was unable to Pt did opt to eat the salad, fruit cup, and sugar cookie

## 2021-10-07 NOTE — ED Notes (Signed)
Pt given two orange juices and graham crackers for low blood sugar. D10 is infusing at 155ml/hr

## 2021-10-08 ENCOUNTER — Observation Stay (HOSPITAL_COMMUNITY): Payer: Medicare Other

## 2021-10-08 DIAGNOSIS — E875 Hyperkalemia: Secondary | ICD-10-CM | POA: Diagnosis present

## 2021-10-08 DIAGNOSIS — D6859 Other primary thrombophilia: Secondary | ICD-10-CM | POA: Diagnosis present

## 2021-10-08 DIAGNOSIS — E162 Hypoglycemia, unspecified: Secondary | ICD-10-CM | POA: Diagnosis present

## 2021-10-08 DIAGNOSIS — M1990 Unspecified osteoarthritis, unspecified site: Secondary | ICD-10-CM | POA: Diagnosis present

## 2021-10-08 DIAGNOSIS — N184 Chronic kidney disease, stage 4 (severe): Secondary | ICD-10-CM | POA: Diagnosis present

## 2021-10-08 DIAGNOSIS — E11649 Type 2 diabetes mellitus with hypoglycemia without coma: Secondary | ICD-10-CM | POA: Diagnosis present

## 2021-10-08 DIAGNOSIS — I5042 Chronic combined systolic (congestive) and diastolic (congestive) heart failure: Secondary | ICD-10-CM | POA: Diagnosis present

## 2021-10-08 DIAGNOSIS — E1122 Type 2 diabetes mellitus with diabetic chronic kidney disease: Secondary | ICD-10-CM | POA: Diagnosis present

## 2021-10-08 DIAGNOSIS — E669 Obesity, unspecified: Secondary | ICD-10-CM | POA: Diagnosis present

## 2021-10-08 DIAGNOSIS — M109 Gout, unspecified: Secondary | ICD-10-CM | POA: Diagnosis present

## 2021-10-08 DIAGNOSIS — R680 Hypothermia, not associated with low environmental temperature: Secondary | ICD-10-CM | POA: Diagnosis present

## 2021-10-08 DIAGNOSIS — I700 Atherosclerosis of aorta: Secondary | ICD-10-CM | POA: Diagnosis present

## 2021-10-08 DIAGNOSIS — M4624 Osteomyelitis of vertebra, thoracic region: Secondary | ICD-10-CM | POA: Diagnosis present

## 2021-10-08 DIAGNOSIS — E039 Hypothyroidism, unspecified: Secondary | ICD-10-CM | POA: Diagnosis present

## 2021-10-08 DIAGNOSIS — Z20822 Contact with and (suspected) exposure to covid-19: Secondary | ICD-10-CM | POA: Diagnosis present

## 2021-10-08 DIAGNOSIS — I878 Other specified disorders of veins: Secondary | ICD-10-CM | POA: Diagnosis present

## 2021-10-08 DIAGNOSIS — E1169 Type 2 diabetes mellitus with other specified complication: Secondary | ICD-10-CM | POA: Diagnosis present

## 2021-10-08 DIAGNOSIS — N400 Enlarged prostate without lower urinary tract symptoms: Secondary | ICD-10-CM | POA: Diagnosis present

## 2021-10-08 DIAGNOSIS — K219 Gastro-esophageal reflux disease without esophagitis: Secondary | ICD-10-CM | POA: Diagnosis present

## 2021-10-08 DIAGNOSIS — I130 Hypertensive heart and chronic kidney disease with heart failure and stage 1 through stage 4 chronic kidney disease, or unspecified chronic kidney disease: Secondary | ICD-10-CM | POA: Diagnosis present

## 2021-10-08 DIAGNOSIS — G9341 Metabolic encephalopathy: Secondary | ICD-10-CM | POA: Diagnosis present

## 2021-10-08 DIAGNOSIS — E785 Hyperlipidemia, unspecified: Secondary | ICD-10-CM | POA: Diagnosis present

## 2021-10-08 DIAGNOSIS — N179 Acute kidney failure, unspecified: Secondary | ICD-10-CM | POA: Diagnosis present

## 2021-10-08 DIAGNOSIS — G894 Chronic pain syndrome: Secondary | ICD-10-CM | POA: Diagnosis present

## 2021-10-08 DIAGNOSIS — N1339 Other hydronephrosis: Secondary | ICD-10-CM | POA: Diagnosis present

## 2021-10-08 LAB — COMPREHENSIVE METABOLIC PANEL
ALT: 10 U/L (ref 0–44)
AST: 23 U/L (ref 15–41)
Albumin: 2.9 g/dL — ABNORMAL LOW (ref 3.5–5.0)
Alkaline Phosphatase: 75 U/L (ref 38–126)
Anion gap: 11 (ref 5–15)
BUN: 78 mg/dL — ABNORMAL HIGH (ref 8–23)
CO2: 19 mmol/L — ABNORMAL LOW (ref 22–32)
Calcium: 6.8 mg/dL — ABNORMAL LOW (ref 8.9–10.3)
Chloride: 102 mmol/L (ref 98–111)
Creatinine, Ser: 4.55 mg/dL — ABNORMAL HIGH (ref 0.61–1.24)
GFR, Estimated: 13 mL/min — ABNORMAL LOW (ref >60)
Glucose, Bld: 279 mg/dL — ABNORMAL HIGH (ref 70–99)
Potassium: 5.7 mmol/L — ABNORMAL HIGH (ref 3.5–5.1)
Sodium: 132 mmol/L — ABNORMAL LOW (ref 135–145)
Total Bilirubin: 0.4 mg/dL (ref 0.3–1.2)
Total Protein: 7.2 g/dL (ref 6.5–8.1)

## 2021-10-08 LAB — CBC
HCT: 25.4 % — ABNORMAL LOW (ref 39.0–52.0)
Hemoglobin: 8.3 g/dL — ABNORMAL LOW (ref 13.0–17.0)
MCH: 31.8 pg (ref 26.0–34.0)
MCHC: 32.7 g/dL (ref 30.0–36.0)
MCV: 97.3 fL (ref 80.0–100.0)
Platelets: 194 10*3/uL (ref 150–400)
RBC: 2.61 MIL/uL — ABNORMAL LOW (ref 4.22–5.81)
RDW: 14.9 % (ref 11.5–15.5)
WBC: 3.9 10*3/uL — ABNORMAL LOW (ref 4.0–10.5)
nRBC: 0 % (ref 0.0–0.2)

## 2021-10-08 LAB — BASIC METABOLIC PANEL
Anion gap: 10 (ref 5–15)
Anion gap: 13 (ref 5–15)
BUN: 74 mg/dL — ABNORMAL HIGH (ref 8–23)
BUN: 79 mg/dL — ABNORMAL HIGH (ref 8–23)
CO2: 17 mmol/L — ABNORMAL LOW (ref 22–32)
CO2: 20 mmol/L — ABNORMAL LOW (ref 22–32)
Calcium: 6.9 mg/dL — ABNORMAL LOW (ref 8.9–10.3)
Calcium: 7.2 mg/dL — ABNORMAL LOW (ref 8.9–10.3)
Chloride: 103 mmol/L (ref 98–111)
Chloride: 104 mmol/L (ref 98–111)
Creatinine, Ser: 4.16 mg/dL — ABNORMAL HIGH (ref 0.61–1.24)
Creatinine, Ser: 4.57 mg/dL — ABNORMAL HIGH (ref 0.61–1.24)
GFR, Estimated: 13 mL/min — ABNORMAL LOW (ref >60)
GFR, Estimated: 15 mL/min — ABNORMAL LOW (ref >60)
Glucose, Bld: 200 mg/dL — ABNORMAL HIGH (ref 70–99)
Glucose, Bld: 97 mg/dL (ref 70–99)
Potassium: 5.3 mmol/L — ABNORMAL HIGH (ref 3.5–5.1)
Potassium: 5.3 mmol/L — ABNORMAL HIGH (ref 3.5–5.1)
Sodium: 133 mmol/L — ABNORMAL LOW (ref 135–145)
Sodium: 134 mmol/L — ABNORMAL LOW (ref 135–145)

## 2021-10-08 LAB — GLUCOSE, CAPILLARY
Glucose-Capillary: 133 mg/dL — ABNORMAL HIGH (ref 70–99)
Glucose-Capillary: 193 mg/dL — ABNORMAL HIGH (ref 70–99)
Glucose-Capillary: 31 mg/dL — CL (ref 70–99)
Glucose-Capillary: 35 mg/dL — CL (ref 70–99)
Glucose-Capillary: 40 mg/dL — CL (ref 70–99)
Glucose-Capillary: 61 mg/dL — ABNORMAL LOW (ref 70–99)
Glucose-Capillary: 77 mg/dL (ref 70–99)
Glucose-Capillary: 86 mg/dL (ref 70–99)

## 2021-10-08 LAB — POTASSIUM: Potassium: 4.6 mmol/L (ref 3.5–5.1)

## 2021-10-08 LAB — BRAIN NATRIURETIC PEPTIDE: B Natriuretic Peptide: 243.6 pg/mL — ABNORMAL HIGH (ref 0.0–100.0)

## 2021-10-08 IMAGING — US US RENAL
1 series · 14 of 25 positions shown · non-contrast
Comparison: [DATE]

CLINICAL DATA: AK I

EXAM:
RENAL / URINARY TRACT ULTRASOUND COMPLETE

[Series 1: us renal · 14 of 34 slices shown]
[im 1/34]
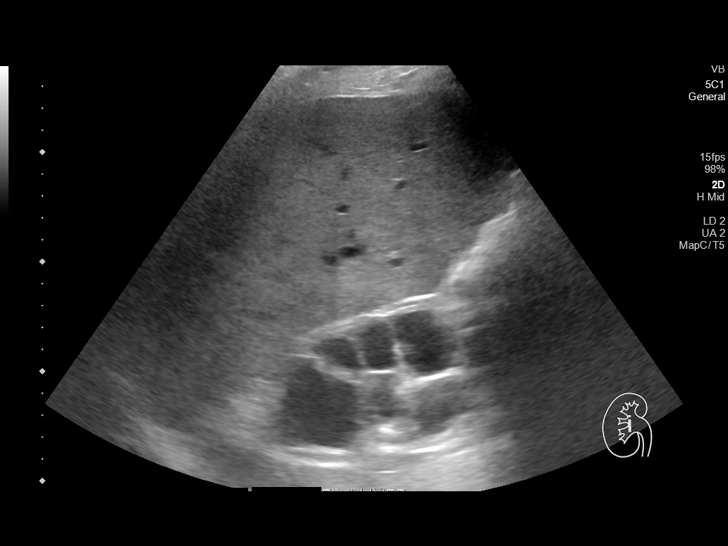
[im 3/34]
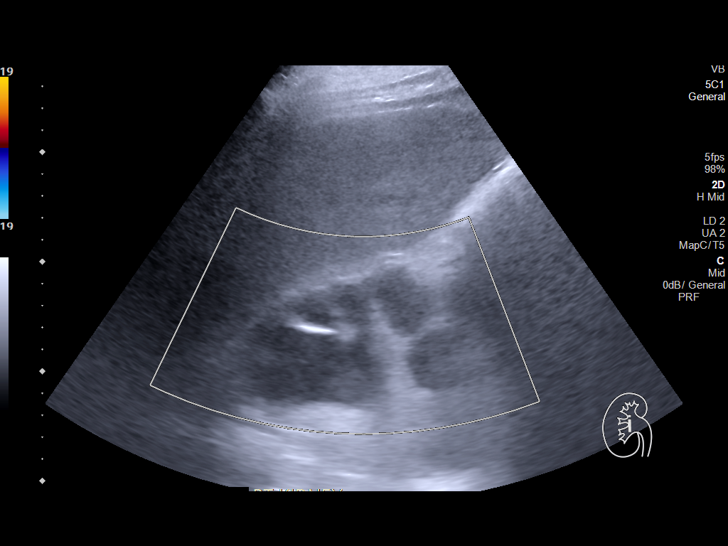
[im 6/34]
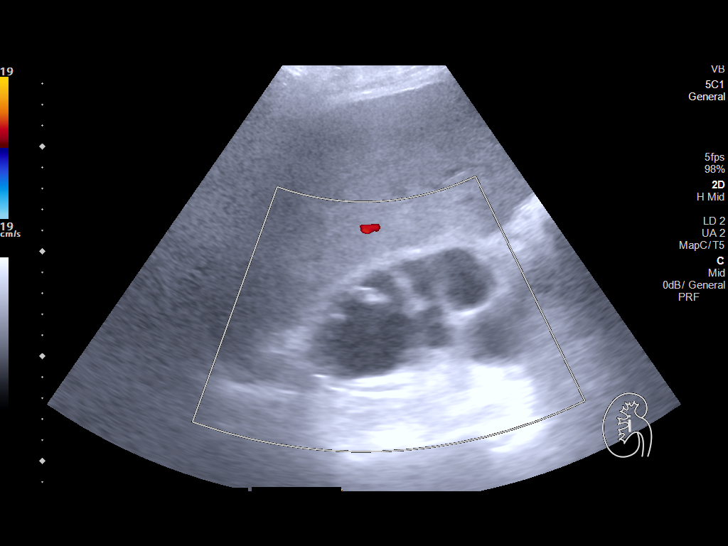
[im 9/34]
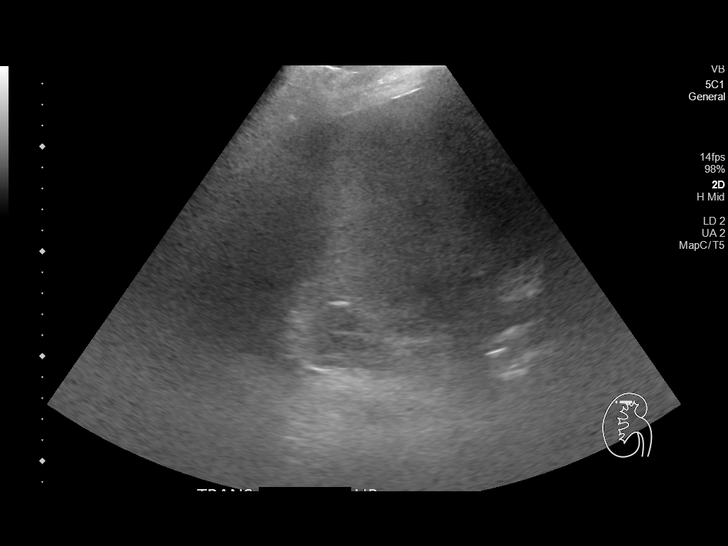
[im 12/34]
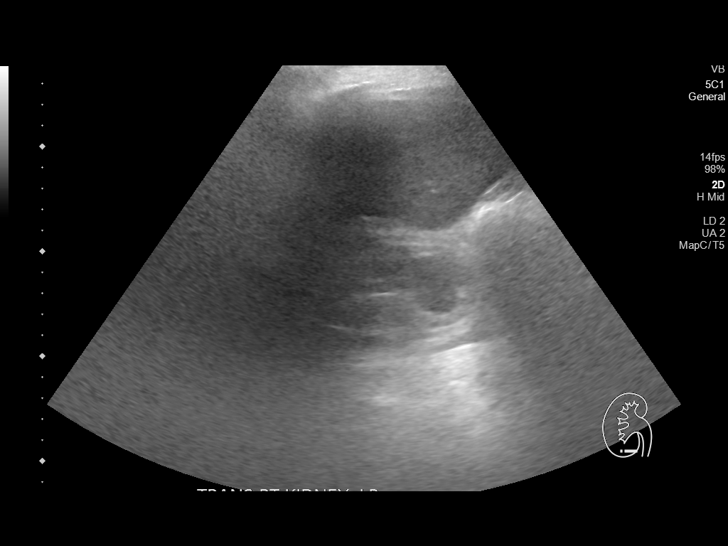
[im 13/34]
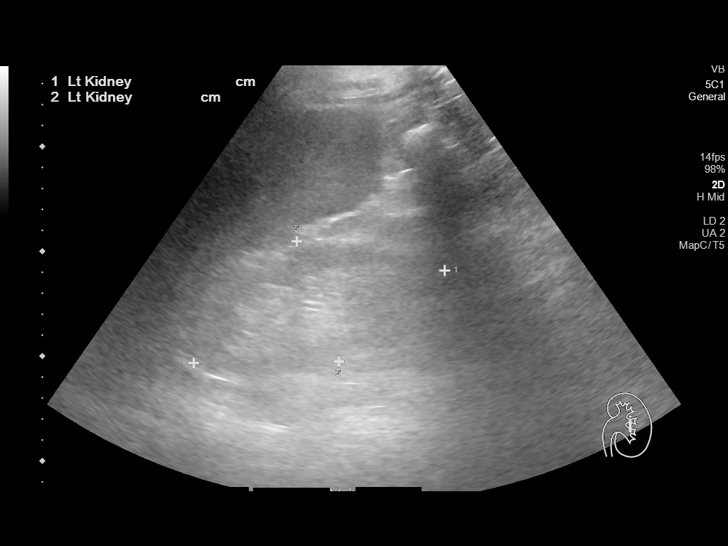
[im 16/34]
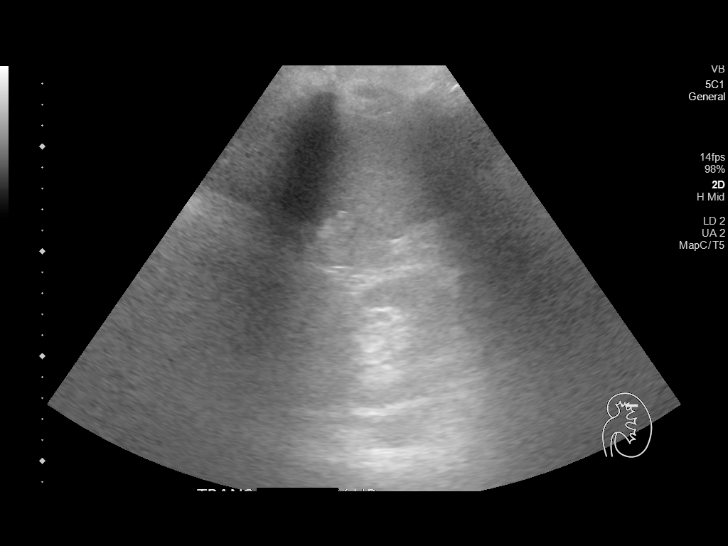
[im 18/34]
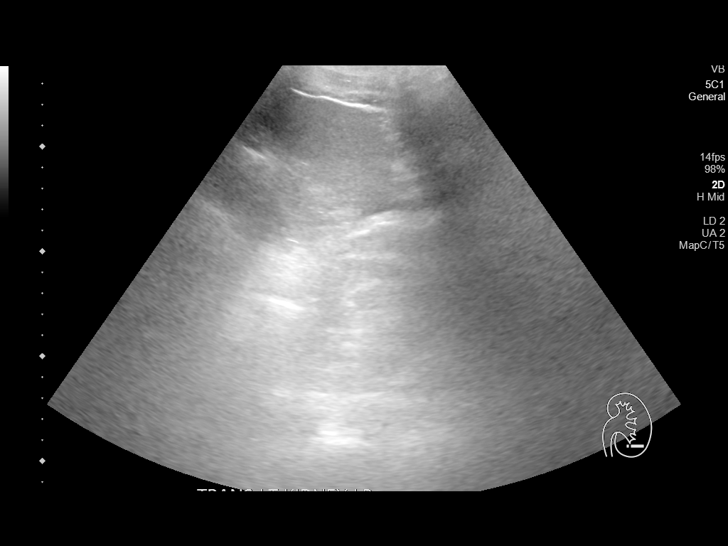
[im 21/34]
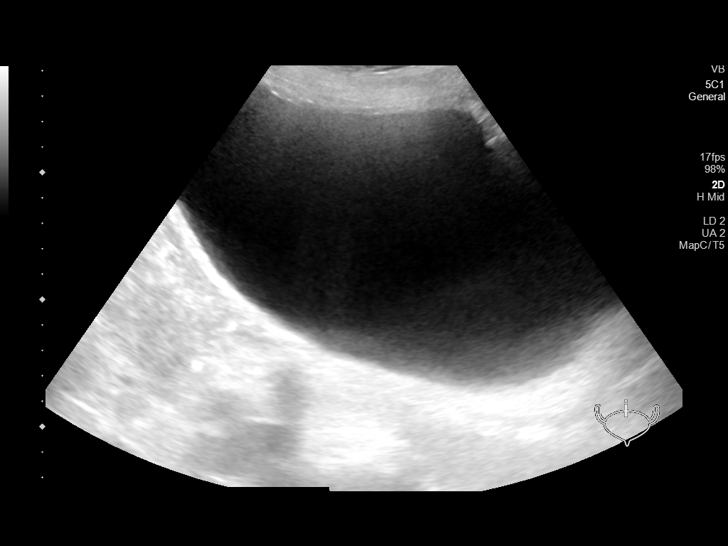
[im 23/34]
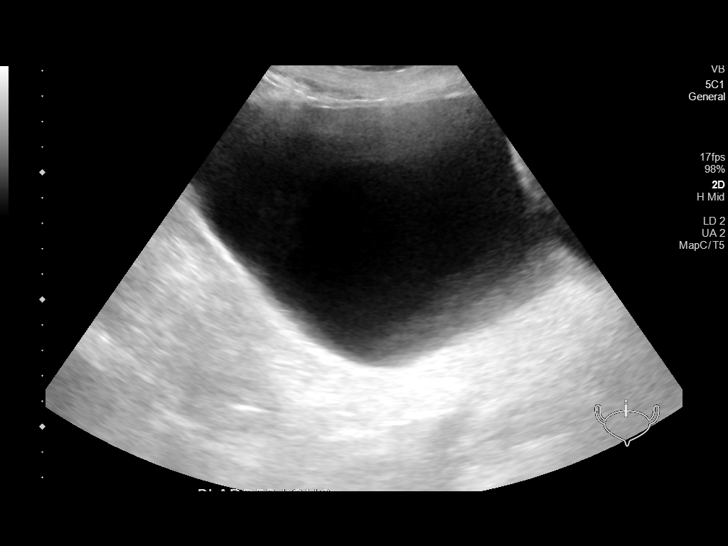
[im 25/34]
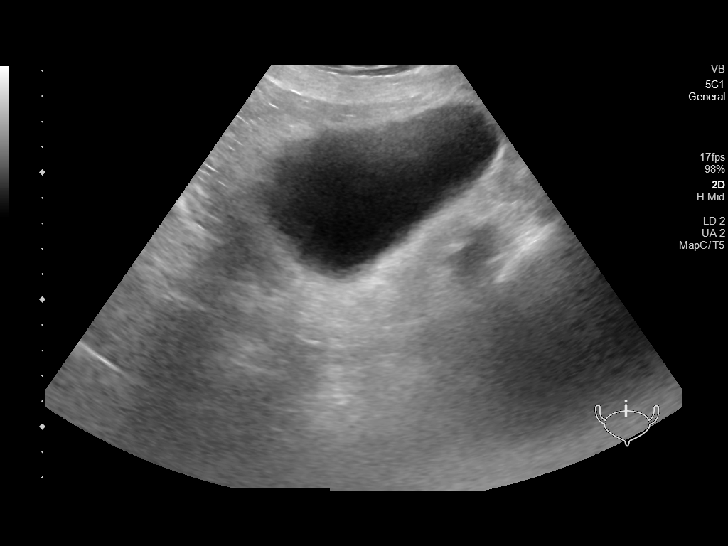
[im 28/34]
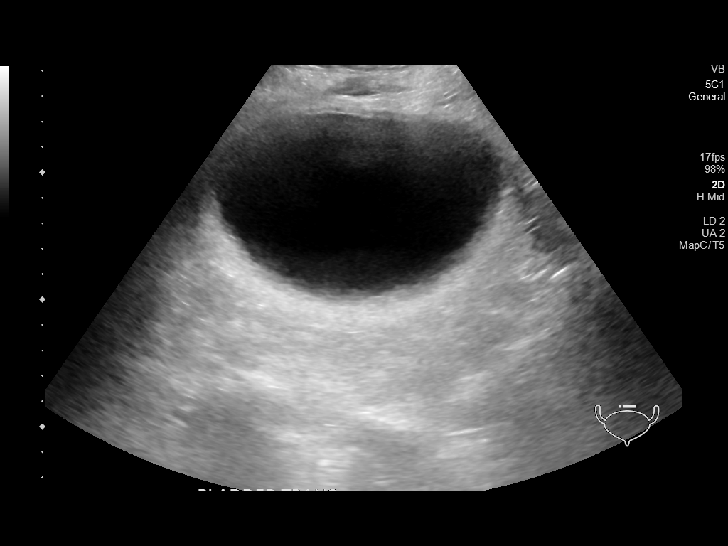
[im 31/34]
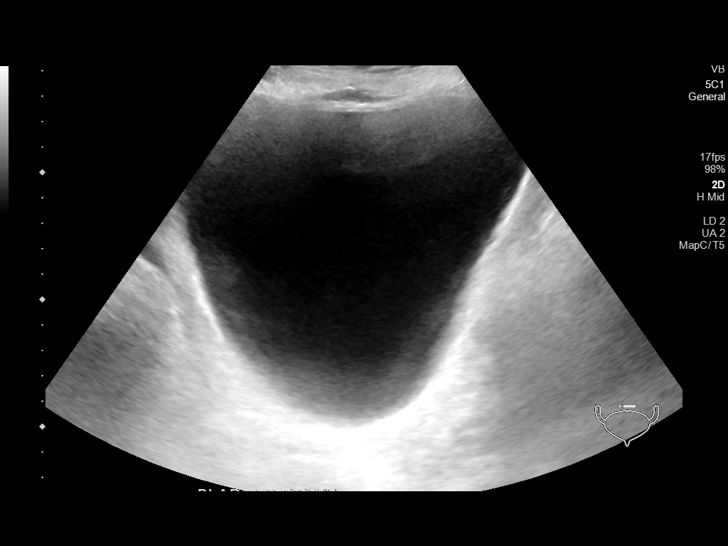
[im 34/34]
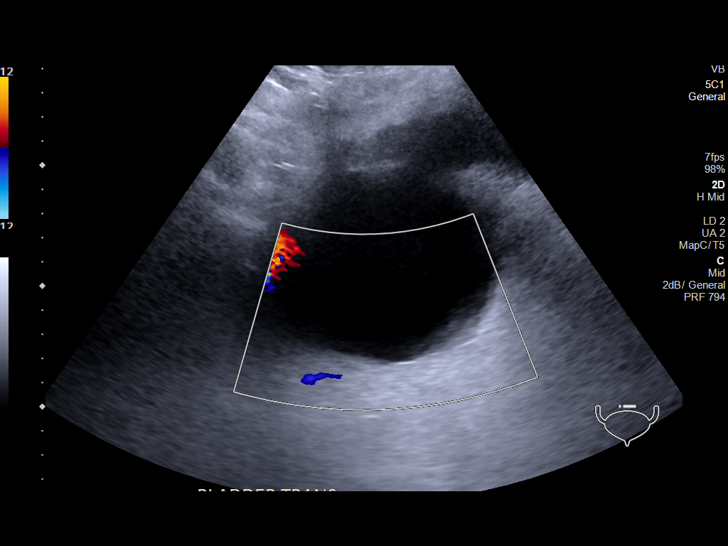

[14 of 25 positions shown; findings below may reference images not displayed]

FINDINGS: Right Kidney:

Renal measurements: 13.9 x 5.9 x 7.0 cm = volume: 298 mL. Severe
right hydronephrosis and cortical thinning.

Left Kidney:

Renal measurements: 12.8 x 6 1 x 6.9 cm = volume: 281 mL.
Echogenicity within normal limits. No mass or hydronephrosis
visualized.

Bladder:

Appears normal for degree of bladder distention.

Other:

None.
IMPRESSION: Severe right hydronephrosis and cortical thinning, unchanged
compared to prior.

## 2021-10-08 MED ORDER — DEXTROSE 50 % IV SOLN
INTRAVENOUS | Status: AC
  Administered 2021-10-08: 18:00:00 50 mL
  Filled 2021-10-08: qty 50

## 2021-10-08 MED ORDER — DEXTROSE-NACL 10-0.45 % IV SOLN: INTRAVENOUS | Status: DC

## 2021-10-08 MED ORDER — COLCHICINE 0.3 MG HALF TABLET
0.3000 mg | ORAL_TABLET | Freq: Every day | ORAL | Status: DC
  Administered 2021-10-08 – 2021-10-14 (×7): 0.3 mg via ORAL
  Filled 2021-10-08 (×2): qty 1
  Filled 2021-10-08 (×2): qty 0.5
  Filled 2021-10-08 (×2): qty 1
  Filled 2021-10-08: qty 0.5
  Filled 2021-10-08: qty 1

## 2021-10-08 MED ORDER — OXYCODONE HCL 5 MG PO TABS
5.0000 mg | ORAL_TABLET | ORAL | Status: AC | PRN
  Administered 2021-10-08 (×2): 5 mg via ORAL
  Filled 2021-10-08 (×2): qty 1

## 2021-10-08 MED ORDER — ALBUTEROL SULFATE (2.5 MG/3ML) 0.083% IN NEBU
10.0000 mg | INHALATION_SOLUTION | Freq: Once | RESPIRATORY_TRACT | Status: AC
  Administered 2021-10-08: 09:00:00 10 mg via RESPIRATORY_TRACT
  Filled 2021-10-08: qty 12

## 2021-10-08 MED ORDER — BISACODYL 10 MG RE SUPP
10.0000 mg | Freq: Once | RECTAL | Status: AC
  Administered 2021-10-08: 21:00:00 10 mg via RECTAL
  Filled 2021-10-08: qty 1

## 2021-10-08 MED ORDER — DEXTROSE-NACL 10-0.45 % IV SOLN
INTRAVENOUS | Status: DC
Start: 2021-10-08 — End: 2021-10-09
  Filled 2021-10-08 (×3): qty 1000

## 2021-10-08 MED ORDER — SODIUM ZIRCONIUM CYCLOSILICATE 10 G PO PACK
10.0000 g | PACK | Freq: Once | ORAL | Status: AC
  Administered 2021-10-08: 09:00:00 10 g via ORAL
  Filled 2021-10-08: qty 1

## 2021-10-08 MED ORDER — GABAPENTIN 300 MG PO CAPS
300.0000 mg | ORAL_CAPSULE | Freq: Two times a day (BID) | ORAL | Status: DC
  Administered 2021-10-08: 21:00:00 300 mg via ORAL
  Filled 2021-10-08 (×2): qty 1

## 2021-10-08 MED ORDER — GLUCOSE 4 G PO CHEW
CHEWABLE_TABLET | ORAL | Status: AC
  Filled 2021-10-08: qty 1

## 2021-10-08 MED ORDER — HYDROMORPHONE HCL 1 MG/ML IJ SOLN
0.5000 mg | INTRAMUSCULAR | Status: AC | PRN
  Administered 2021-10-08: 13:00:00 0.5 mg via INTRAVENOUS
  Filled 2021-10-08: qty 0.5

## 2021-10-08 MED ORDER — OXYCODONE HCL ER 15 MG PO T12A
30.0000 mg | EXTENDED_RELEASE_TABLET | Freq: Three times a day (TID) | ORAL | Status: DC
  Administered 2021-10-08 – 2021-10-14 (×17): 30 mg via ORAL
  Filled 2021-10-08 (×18): qty 2

## 2021-10-08 MED ORDER — SODIUM CHLORIDE 0.9 % IV SOLN: INTRAVENOUS | Status: DC

## 2021-10-08 MED ORDER — METHOCARBAMOL 500 MG PO TABS
750.0000 mg | ORAL_TABLET | Freq: Three times a day (TID) | ORAL | Status: DC | PRN
  Administered 2021-10-08 – 2021-10-12 (×2): 750 mg via ORAL
  Filled 2021-10-08 (×3): qty 2

## 2021-10-08 NOTE — Plan of Care (Signed)

## 2021-10-08 NOTE — Progress Notes (Signed)
PROGRESS NOTE    Thomas [name]  YDX:[id number] DOB: [birth year] DOA: 10/07/2021 PCP: Antonietta Jewel, MD     Brief Narrative:  Thomas [name] is a 70 y.o. male with medical history significant of osteoarthritis, BPH, chronic pain syndrome, stage IV CKD, type II DM, gout, hypertension, physical deconditioning, history of PE and LLE DVT, right BKA, chronic systolic CHF, discharged from the hospital 6 days ago when he was admitted due to AKI in the setting of right kidney hydronephrosis, acute respiratory failure with hypoxia in the setting of left lower lobe pneumonia, hypertensive emergency, also presenting with altered mental status but in the setting of hypoglycemia.  He was found at home by home health RN with hypoglycemia in the 30s.   New events last 24 hours / Subjective: Patient is alert and oriented today.  He is able to tell me that he was released from the hospital and had been doing okay.  Unable to recall name of his diabetic medication that causes hypoglycemia.  Denies poor oral intake or lack of appetite at home.  No chest pain or shortness of breath today  Assessment & Plan:   Principal Problem:   Hypoglycemia Active Problems:   [name], type 2) (HCC)   Gout   HTN (hypertension)   GERD (gastroesophageal reflux disease)   CKD (chronic kidney disease), stage IV (HCC)   AKI (acute kidney injury) ([place])   DVT (deep venous thrombosis) (HCC)   Acute metabolic encephalopathy secondary to hypoglycemia -Resolved -Stop glimepiride -Reduce gabapentin dose  -Continue to check CBG every 4 hour today  AKI on CKD stage IV -Baseline creatinine is 2.4 -Recent history of AKI secondary to urinary retention, chronic right hydronephrosis -Continue IV fluid -Check renal ultrasound -Has a condom cath in place, monitor intake and output  Mild hyperkalemia -Lokelma ordered today -Albuterol ordered today -Check potassium every 4 hours until normal  History of  PE -Continue Eliquis  HLD -Continue lipitor   Hypertension -Continue amlodipine, hydralazine, Catapres, metoprolol  Hypothyroidism -Continue synthroid   History of urinary retention -Foley catheter was discontinued previous hospital stay -Continue Proscar, flomax  -Follow-up with Dr. Gloriann Loan, urology  DVT prophylaxis:   apixaban (ELIQUIS) tablet 5 mg  Code Status: Full code Family Communication: No family at bedside Disposition Plan:  Status is: Observation  The patient will require care spanning > 2 midnights and should be moved to inpatient because: Remains on IV fluid, AKI, hyperkalemia treatment     Consultants:  None  Procedures:  None   Antimicrobials:  Anti-infectives (From admission, onward)    None        Objective: Vitals:   10/08/21 0857 10/08/21 0920 10/08/21 1000 10/08/21 1100  BP:  (!) 146/71 (!) 153/72 133/61  Pulse:  71 76 71  Resp:  17 14 11   Temp:      TempSrc:      SpO2: 98%     Weight:      Height:        Intake/Output Summary (Last 24 hours) at 10/08/2021 1226 Last data filed at 10/08/2021 0937 Gross per 24 hour  Intake 2344.49 ml  Output 1200 ml  Net 1144.49 ml   Filed Weights   10/07/21 2332  Weight: 124.1 kg    Examination:  General exam: Appears calm and comfortable  Respiratory system: Clear to auscultation. Respiratory effort normal. No respiratory distress. No conversational dyspnea.  Cardiovascular system: S1 & S2 heard, RRR. No murmurs. No pedal edema.  Gastrointestinal system: Abdomen is nondistended, soft and nontender. Normal bowel sounds heard. Central nervous system: Alert and oriented. No focal neurological deficits. Speech clear.  Extremities: Right BKA  Skin: No rashes, lesions or ulcers on exposed skin  Psychiatry: Judgement and insight appear normal. Mood & affect appropriate.   Data Reviewed: I have personally reviewed following labs and imaging studies  CBC: Recent Labs  Lab 10/07/21 1250  10/08/21 0420  WBC 6.7 3.9*  HGB 8.6* 8.3*  HCT 28.2* 25.4*  MCV 100.4* 97.3  PLT 200 967   Basic Metabolic Panel: Recent Labs  Lab 10/07/21 1250 10/07/21 2342 10/08/21 0420 10/08/21 0745  NA 134* 134* 132* 133*  K 4.3 5.3* 5.7* 5.3*  CL 104 104 102 103  CO2 18* 17* 19* 20*  GLUCOSE 40* 97 279* 200*  BUN 80* 79* 78* 74*  CREATININE 4.64* 4.57* 4.55* 4.16*  CALCIUM 7.0* 7.2* 6.8* 6.9*   GFR: Estimated Creatinine Clearance: 25.1 mL/min (A) (by C-G formula based on SCr of 4.16 mg/dL (H)). Liver Function Tests: Recent Labs  Lab 10/08/21 0420  AST 23  ALT 10  ALKPHOS 75  BILITOT 0.4  PROT 7.2  ALBUMIN 2.9*   No results for input(s): LIPASE, AMYLASE in the last 168 hours. No results for input(s): AMMONIA in the last 168 hours. Coagulation Profile: No results for input(s): INR, PROTIME in the last 168 hours. Cardiac Enzymes: No results for input(s): CKTOTAL, CKMB, CKMBINDEX, TROPONINI in the last 168 hours. BNP (last 3 results) No results for input(s): PROBNP in the last 8760 hours. HbA1C: No results for input(s): HGBA1C in the last 72 hours. CBG: Recent Labs  Lab 10/07/21 1854 10/07/21 2007 10/07/21 2227 10/08/21 0010 10/08/21 1144  GLUCAP 52* 64* 113* 133* 86   Lipid Profile: No results for input(s): CHOL, HDL, LDLCALC, TRIG, CHOLHDL, LDLDIRECT in the last 72 hours. Thyroid Function Tests: No results for input(s): TSH, T4TOTAL, FREET4, T3FREE, THYROIDAB in the last 72 hours. Anemia Panel: No results for input(s): VITAMINB12, FOLATE, FERRITIN, TIBC, IRON, RETICCTPCT in the last 72 hours. Sepsis Labs: Recent Labs  Lab 10/07/21 1350 10/07/21 1502  LATICACIDVEN 2.0* 1.9    Recent Results (from the past 240 hour(s))  Blood culture (routine x 2)     Status: None (Preliminary result)   Collection Time: 10/07/21  1:33 PM   Specimen: BLOOD RIGHT FOREARM  Result Value Ref Range Status   Specimen Description   Final    BLOOD RIGHT FOREARM Performed at  [facility], [place] [address], [place], [place] [address]    Special Requests   Final    BOTTLES DRAWN AEROBIC ONLY Blood Culture results may not be optimal due to an inadequate volume of blood received in culture bottles Performed at [facility] [address], [place], [place] [address]    Culture   Final    NO GROWTH < 24 HOURS Performed at [facility] [address] [place], [place] [address]    Report Status PENDING  Incomplete  Blood culture (routine x 2)     Status: None (Preliminary result)   Collection Time: 10/07/21  1:38 PM   Specimen: BLOOD LEFT FOREARM  Result Value Ref Range Status   Specimen Description   Final    BLOOD LEFT FOREARM Performed at [facility], [place] [address], [place], [place] [address]    Special Requests   Final    BOTTLES DRAWN AEROBIC ONLY Blood Culture results  may not be optimal due to an inadequate volume of blood received in culture bottles Performed at [facility] [address] [place], [place] [address]    Culture   Final    NO GROWTH < 24 HOURS Performed at [facility] [address], [place], [place] [address]    Report Status PENDING  Incomplete  Resp Panel by RT-PCR (Flu A&B, Covid) Nasopharyngeal Swab     Status: None   Collection Time: 10/07/21  3:03 PM   Specimen: Nasopharyngeal Swab; Nasopharyngeal(NP) swabs in vial transport medium  Result Value Ref Range Status   SARS Coronavirus 2 by RT PCR NEGATIVE NEGATIVE Final    Comment: (NOTE) SARS-CoV-2 target nucleic acids are NOT DETECTED.  The SARS-CoV-2 RNA is generally detectable in upper respiratory specimens during the acute phase of infection. The lowest concentration of SARS-CoV-2 viral copies this assay can detect is 138 copies/mL. A negative result does not preclude SARS-Cov-2 infection and should not be used as the sole basis for treatment or other patient management decisions. A negative result may  occur with  improper specimen collection/handling, submission of specimen other than nasopharyngeal swab, presence of viral mutation(s) within the areas targeted by this assay, and inadequate number of viral copies(<138 copies/mL). A negative result must be combined with clinical observations, patient history, and epidemiological information. The expected result is Negative.  Fact Sheet for Patients:  EntrepreneurPulse.com.au  Fact Sheet for Healthcare Providers:  IncredibleEmployment.be  This test is no t yet approved or cleared by the [place] FDA and  has been authorized for detection and/or diagnosis of SARS-CoV-2 by FDA under an Emergency Use Authorization (EUA). This EUA will remain  in effect (meaning this test can be used) for the duration of the COVID-19 declaration under Section 564(b)(1) of the Act, 21 U.S.C.section 360bbb-3(b)(1), unless the authorization is terminated  or revoked sooner.       Influenza A by PCR NEGATIVE NEGATIVE Final   Influenza B by PCR NEGATIVE NEGATIVE Final    Comment: (NOTE) The Xpert Xpress SARS-CoV-2/FLU/RSV plus assay is intended as an aid in the diagnosis of influenza from Nasopharyngeal swab specimens and should not be used as a sole basis for treatment. Nasal washings and aspirates are unacceptable for Xpert Xpress SARS-CoV-2/FLU/RSV testing.  Fact Sheet for Patients: EntrepreneurPulse.com.au  Fact Sheet for Healthcare Providers: IncredibleEmployment.be  This test is not yet approved or cleared by the [place] FDA and has been authorized for detection and/or diagnosis of SARS-CoV-2 by FDA under an Emergency Use Authorization (EUA). This EUA will remain in effect (meaning this test can be used) for the duration of the COVID-19 declaration under Section 564(b)(1) of the Act, 21 U.S.C. section 360bbb-3(b)(1), unless the authorization is terminated  or revoked.  Performed at [facility], [place] [address], [place], [place] [address]       Radiology Studies: DG Chest 2 View  Result Date: 10/07/2021 CLINICAL DATA:  Weakness EXAM: CHEST - 2 VIEW COMPARISON:  09/23/2021 FINDINGS: Stable cardiomegaly. Prior median sternotomy. Pulmonary vascular congestion with diffuse bilateral interstitial prominence. No large pleural fluid collection. No pneumothorax. IMPRESSION: Findings suggestive of CHF with interstitial edema. Electronically Signed   By: Davina Poke D.O.   On: 10/07/2021 14:19   CT Head Wo Contrast  Result Date: 10/07/2021 CLINICAL DATA:  Altered mental status. EXAM: CT HEAD WITHOUT CONTRAST TECHNIQUE: Contiguous axial images were obtained from the base of the skull through the vertex without intravenous contrast.  COMPARISON:  September 23, 2021. FINDINGS: Brain: No evidence of acute infarction, hemorrhage, hydrocephalus, extra-axial collection or mass lesion/mass effect. Vascular: No hyperdense vessel or unexpected calcification. Skull: Normal. Negative for fracture or focal lesion. Sinuses/Orbits: No acute finding. Other: None. IMPRESSION: No acute intracranial abnormality seen. Electronically Signed   By: Marijo Conception M.D.   On: 10/07/2021 14:27      Scheduled Meds:  allopurinol  100 mg Oral Daily   amLODipine  25 mg Oral Daily   apixaban  5 mg Oral BID   atorvastatin  80 mg Oral Daily   cloNIDine  0.3 mg Transdermal Weekly   colchicine  0.3 mg Oral Daily   finasteride  5 mg Oral Daily   gabapentin  300 mg Oral BID   glucagon (human recombinant)  1 mg Intravenous Once   hydrALAZINE  100 mg Oral Q8H   lactulose  10 g Oral BID   levothyroxine  50 mcg Oral Q0600   metoprolol tartrate  50 mg Oral BID   pantoprazole  40 mg Oral BID   saccharomyces boulardii  250 mg Oral BID   sodium chloride flush  3 mL Intravenous Q12H   tamsulosin  0.4 mg Oral Daily   Continuous Infusions:  sodium chloride      dextrose 10 mL/hr at 10/08/21 0611     LOS: 0 days      Time spent: 35 minutes   Dessa Phi, DO [facility] 10/08/2021, 12:26 PM   Available via Epic secure chat 7am-7pm After these hours, please refer to coverage provider listed on [email or web address]

## 2021-10-08 NOTE — Progress Notes (Signed)
Hypoglycemic Event  CBG: 31  Treatment: 4 oz juice/soda and D50 50 mL (25 gm)  Symptoms: Pale, Sweaty, Shaky, Nervous/irritable, and Vision changes  Follow-up CBG: Time:1730 CBG Result:193  Possible Reasons for Event: medical regimen  Comments/MD notified:1725     Zadie Rhine

## 2021-10-09 DIAGNOSIS — I5042 Chronic combined systolic (congestive) and diastolic (congestive) heart failure: Secondary | ICD-10-CM

## 2021-10-09 DIAGNOSIS — G9341 Metabolic encephalopathy: Secondary | ICD-10-CM | POA: Diagnosis present

## 2021-10-09 DIAGNOSIS — G894 Chronic pain syndrome: Secondary | ICD-10-CM

## 2021-10-09 DIAGNOSIS — N179 Acute kidney failure, unspecified: Secondary | ICD-10-CM | POA: Diagnosis not present

## 2021-10-09 DIAGNOSIS — E039 Hypothyroidism, unspecified: Secondary | ICD-10-CM | POA: Diagnosis present

## 2021-10-09 DIAGNOSIS — E875 Hyperkalemia: Secondary | ICD-10-CM | POA: Diagnosis present

## 2021-10-09 DIAGNOSIS — N1330 Unspecified hydronephrosis: Secondary | ICD-10-CM | POA: Diagnosis present

## 2021-10-09 LAB — BASIC METABOLIC PANEL
Anion gap: 11 (ref 5–15)
BUN: 75 mg/dL — ABNORMAL HIGH (ref 8–23)
CO2: 21 mmol/L — ABNORMAL LOW (ref 22–32)
Calcium: 7 mg/dL — ABNORMAL LOW (ref 8.9–10.3)
Chloride: 106 mmol/L (ref 98–111)
Creatinine, Ser: 3.82 mg/dL — ABNORMAL HIGH (ref 0.61–1.24)
GFR, Estimated: 16 mL/min — ABNORMAL LOW (ref >60)
Glucose, Bld: 72 mg/dL (ref 70–99)
Potassium: 4.6 mmol/L (ref 3.5–5.1)
Sodium: 138 mmol/L (ref 135–145)

## 2021-10-09 LAB — GLUCOSE, CAPILLARY
Glucose-Capillary: 91 mg/dL (ref 70–99)
Glucose-Capillary: 85 mg/dL (ref 70–99)
Glucose-Capillary: 78 mg/dL (ref 70–99)
Glucose-Capillary: 111 mg/dL — ABNORMAL HIGH (ref 70–99)
Glucose-Capillary: 99 mg/dL (ref 70–99)
Glucose-Capillary: 143 mg/dL — ABNORMAL HIGH (ref 70–99)
Glucose-Capillary: 127 mg/dL — ABNORMAL HIGH (ref 70–99)

## 2021-10-09 LAB — CBC
HCT: 28.4 % — ABNORMAL LOW (ref 39.0–52.0)
Hemoglobin: 8.8 g/dL — ABNORMAL LOW (ref 13.0–17.0)
MCH: 31.3 pg (ref 26.0–34.0)
MCHC: 31 g/dL (ref 30.0–36.0)
MCV: 101.1 fL — ABNORMAL HIGH (ref 80.0–100.0)
Platelets: 210 10*3/uL (ref 150–400)
RBC: 2.81 MIL/uL — ABNORMAL LOW (ref 4.22–5.81)
RDW: 15.2 % (ref 11.5–15.5)
WBC: 6.8 10*3/uL (ref 4.0–10.5)
nRBC: 0 % (ref 0.0–0.2)

## 2021-10-09 MED ORDER — LACTATED RINGERS IV SOLN: INTRAVENOUS | Status: AC

## 2021-10-09 MED ORDER — GABAPENTIN 300 MG PO CAPS
300.0000 mg | ORAL_CAPSULE | Freq: Two times a day (BID) | ORAL | Status: DC
  Administered 2021-10-10 – 2021-10-14 (×9): 300 mg via ORAL
  Filled 2021-10-09 (×9): qty 1

## 2021-10-09 NOTE — Assessment & Plan Note (Signed)
Continue allopurinol 

## 2021-10-09 NOTE — Plan of Care (Signed)

## 2021-10-09 NOTE — Assessment & Plan Note (Signed)
Neck

## 2021-10-09 NOTE — Assessment & Plan Note (Signed)
-   Continue Flomax, finasteride

## 2021-10-09 NOTE — Assessment & Plan Note (Signed)
Continue levothyroxine 

## 2021-10-09 NOTE — Assessment & Plan Note (Signed)
Glucoses are normal - Not a candidate for glipizide, did not represcribe at discharge - Stop dextrose infusion

## 2021-10-09 NOTE — Progress Notes (Deleted)
Hypoglycemic Event  CBG: 61  Treatment: 8 oz juice/soda  Symptoms: Shaky  Follow-up CBG: Time: 0118 CBG Result:61  Possible Reasons for Event: Unknown   Thomas [name] C [name]

## 2021-10-09 NOTE — Progress Notes (Signed)
Hypoglycemic Event  CBG: 61  Treatment: 8 oz juice/soda  Symptoms: Shaky  Follow-up CBG: Time:118 CBG Result:91  Possible Reasons for Event: Unknown  Thomas [name] C Senai Kingsley

## 2021-10-09 NOTE — Assessment & Plan Note (Signed)
-   Continue Eliquis 

## 2021-10-09 NOTE — Assessment & Plan Note (Signed)
Resolved with fluids °

## 2021-10-09 NOTE — Assessment & Plan Note (Addendum)
Resolved with holding glipizide and a dextrose infusion

## 2021-10-09 NOTE — Assessment & Plan Note (Signed)
Not on diuretics at home - Continue metoprolol

## 2021-10-09 NOTE — Assessment & Plan Note (Signed)
Cr baseline 2.4, currently still >3.  Renal US no change.  Making good urine and Cr improving without foley, which was removed during last hospitalization. -Continue IV fuids

## 2021-10-09 NOTE — Assessment & Plan Note (Signed)
Due to hypoglycemia Resolved

## 2021-10-09 NOTE — Assessment & Plan Note (Signed)
-   Continue home OxyContin, Robaxin -Hold gabapentin today renal function

## 2021-10-09 NOTE — Assessment & Plan Note (Signed)
Patient has AKI on CKD stage IV, baseline creatinine 2.4

## 2021-10-09 NOTE — Assessment & Plan Note (Signed)
-   Continue amlodipine, clonidine, hydralazine, metoprolol

## 2021-10-09 NOTE — Progress Notes (Signed)
°  Progress Note    ABUNDIO TEUSCHER   SFK:[id number]  DOB: 1951-03-31  DOA: 10/07/2021     1 Date of Service: 10/09/2021      Brief summary: [name] is a 70 y.o. M with chronic pain syndrome, stage IV CKD, type II DM, gout, hx PE and LLE PVT, HTN, and sCHF discharged from the hospital 6 days PTA (for AKI in the setting of right kidney hydronephrosis, acute respiratory failure with hypoxia in the setting of left lower lobe pneumonia, hypertensive emergency) who was found at home by home health RN confused, with hypoglycemia in the 79s.    12/20: Mentation improved but glucoses not stable        Assessment and Plan * Acute metabolic encephalopathy Due to hypoglycemia Resolved  Hydronephrosis of right kidney Neck  Hypothyroidism - Continue levothyroxine  Hyperkalemia Resolved with fluids  DVT (deep venous thrombosis) (HCC) - Continue Eliquis  Hypoglycemia Resolved with holding glipizide and a dextrose infusion  AKI (acute kidney injury) ([place]) Cr baseline 2.4, currently still >3.  Renal US no change.  Making good urine and Cr improving without foley, which was removed during last hospitalization. -Continue IV fuids   Chronic pain syndrome - Continue home OxyContin, Robaxin -Hold gabapentin today renal function  CKD (chronic kidney disease), stage IV (HCC) Patient has AKI on CKD stage IV, baseline creatinine 2.4  BPH (benign prostatic hyperplasia) - Continue Flomax, finasteride  Chronic combined systolic and diastolic CHF (congestive heart failure) ([place]) Not on diuretics at home - Continue metoprolol  HTN (hypertension) - Continue amlodipine, clonidine, hydralazine, metoprolol  Gout - Continue allopurinol  [name], type 2) (HCC) Glucoses are normal - Not a candidate for glipizide, did not represcribe at discharge - Stop dextrose infusion     Subjective:  Patient feels well, is making urine, he has no dysuria, no  confusion, no vomiting, no weakness, sweatiness, shakiness  Objective Vitals:   10/09/21 0349 10/09/21 1021 10/09/21 1324 10/09/21 1420  BP: 140/65 (!) 147/66 138/69 129/67  Pulse: 67 67 64 67  Resp: 16   18  Temp:    98.5 F (36.9 C)  TempSrc: Oral     SpO2: 98%  96% 93%  Weight:      Height:       124.1 kg  Vital signs were reviewed and unremarkable.   Exam General appearance: Obese adult male, lying bed, no acute distress, interactive     HEENT: Anicteric, conjunctival pink, lids lashes normal Skin: No suspicious rashes or lesions Cardiac: RRR, no murmurs, no lower extremity edema Respiratory: Normal respiratory rate and rhythm, lungs clear without rales or wheezes Abdomen: Abdomen soft without tenderness palpation or guarding, no ascites or distention MSK: Right BKA Neuro: Awake and alert, moves upper extremities with generalized weakness but symmetric strength Psych: Attention normal, affect normal, judgment Syprine normal    Labs / Other Information My review of labs, imaging, notes and other tests is significant for Renal function still worse than baseline,     Disposition Plan: Status is: Inpatient  Remains inpatient appropriate because: Samul Dada ongoing IV fluids  Spoke to brother by phone        [facility] 10/09/2021, 5:54 PM

## 2021-10-09 NOTE — Hospital Course (Addendum)
Thomas [name] is a 70 y.o. M with chronic pain syndrome, stage IV CKD, type II DM, gout, hx PE and LLE PVT, HTN, and sCHF discharged from the hospital 6 days PTA (for AKI in the setting of right kidney hydronephrosis, acute respiratory failure with hypoxia in the setting of left lower lobe pneumonia, hypertensive emergency) who was found at home by home health RN confused, with hypoglycemia in the 30s.    12/20: Mentation improved but glucoses not stable 12/21: Glucoses stable.  Cr trending down 12/23: Failed PO fluid challenge, Cr back up, IV fluids restarted

## 2021-10-10 DIAGNOSIS — G9341 Metabolic encephalopathy: Secondary | ICD-10-CM | POA: Diagnosis not present

## 2021-10-10 LAB — GLUCOSE, CAPILLARY
Glucose-Capillary: 137 mg/dL — ABNORMAL HIGH (ref 70–99)
Glucose-Capillary: 107 mg/dL — ABNORMAL HIGH (ref 70–99)
Glucose-Capillary: 124 mg/dL — ABNORMAL HIGH (ref 70–99)
Glucose-Capillary: 137 mg/dL — ABNORMAL HIGH (ref 70–99)
Glucose-Capillary: 113 mg/dL — ABNORMAL HIGH (ref 70–99)

## 2021-10-10 LAB — BASIC METABOLIC PANEL
Anion gap: 7 (ref 5–15)
BUN: 66 mg/dL — ABNORMAL HIGH (ref 8–23)
CO2: 22 mmol/L (ref 22–32)
Calcium: 7.3 mg/dL — ABNORMAL LOW (ref 8.9–10.3)
Chloride: 103 mmol/L (ref 98–111)
Creatinine, Ser: 3.28 mg/dL — ABNORMAL HIGH (ref 0.61–1.24)
GFR, Estimated: 20 mL/min — ABNORMAL LOW (ref >60)
Glucose, Bld: 152 mg/dL — ABNORMAL HIGH (ref 70–99)
Potassium: 4.9 mmol/L (ref 3.5–5.1)
Sodium: 132 mmol/L — ABNORMAL LOW (ref 135–145)

## 2021-10-10 MED ORDER — PROMETHAZINE HCL 25 MG PO TABS
12.5000 mg | ORAL_TABLET | Freq: Four times a day (QID) | ORAL | Status: DC | PRN
  Administered 2021-10-10: 13:00:00 12.5 mg via ORAL
  Filled 2021-10-10: qty 1

## 2021-10-10 MED ORDER — HYDROMORPHONE HCL 1 MG/ML IJ SOLN
0.5000 mg | Freq: Once | INTRAMUSCULAR | Status: AC | PRN
  Administered 2021-10-10: 06:00:00 0.5 mg via INTRAVENOUS
  Filled 2021-10-10: qty 0.5

## 2021-10-10 NOTE — Assessment & Plan Note (Addendum)
-   Continue allopurinol, renal dose

## 2021-10-10 NOTE — Assessment & Plan Note (Signed)
Patient has AKI on CKD stage IV, baseline creatinine 2.4

## 2021-10-10 NOTE — Assessment & Plan Note (Signed)
Due to hypoglycemia Resolved

## 2021-10-10 NOTE — Assessment & Plan Note (Addendum)
Not on diuretics at home, appears euvolemic - Continue metoprolol

## 2021-10-10 NOTE — Assessment & Plan Note (Signed)
Continue levothyroxine 

## 2021-10-10 NOTE — Assessment & Plan Note (Signed)
Chronic. 

## 2021-10-10 NOTE — Assessment & Plan Note (Addendum)
I do not think this is obstructive, as his PVRs are okay and his Cr coming down with fluids and FeNA low. - Continue Flomax, finasteride

## 2021-10-10 NOTE — Assessment & Plan Note (Addendum)
BP near goal - Continue amlodipine, clonidine, hydralazine, metoprolol

## 2021-10-10 NOTE — Progress Notes (Signed)
°  Progress Note   Patient: Thomas [name] [id number] DOB: 04/25/1951 DOA: 10/07/2021     2 DOS: the patient was seen and examined on 10/10/2021   Brief hospital course: [name] is a 70 y.o. M with chronic pain syndrome, stage IV CKD, type II DM, gout, hx PE and LLE PVT, HTN, and sCHF discharged from the hospital 6 days PTA (for AKI in the setting of right kidney hydronephrosis, acute respiratory failure with hypoxia in the setting of left lower lobe pneumonia, hypertensive emergency) who was found at home by home health RN confused, with hypoglycemia in the 64s.    12/20: Mentation improved but glucoses not stable 12/21: Cr trending down but still on IV fluids  Assessment and Plan * Acute metabolic encephalopathy- (present on admission) Due to hypoglycemia Resolved  Hydronephrosis of right kidney- (present on admission) Chronic  Hypothyroidism- (present on admission) - Continue levothyroxine  Hyperkalemia- (present on admission) Resolved with fluids  DVT (deep venous thrombosis) ([place])- (present on admission) - Continue Eliquis  Hypoglycemia- (present on admission) Resolved with holding glipizide and a dextrose infusion  AKI (acute kidney injury) ([place])- (present on admission) Cr baseline 2.4, currently still >3 but improving.    -Hold IV fluids and trend Cr, if able to continue to improve off IV fluids, will d/c tomorrow   Chronic pain syndrome- (present on admission) - Continue home OxyContin, Robaxin -resume gabapentin as Cr improving  CKD (chronic kidney disease), stage IV ([place])- (present on admission) Patient has AKI on CKD stage IV, baseline creatinine 2.4  BPH (benign prostatic hyperplasia)- (present on admission) - Continue Flomax, finasteride  Chronic combined systolic and diastolic CHF (congestive heart failure) ([place])- (present on admission) Not on diuretics at home - Continue metoprolol  HTN (hypertension)- (present on admission) BP normal -  Continue amlodipine, clonidine, hydralazine, metoprolol  Gout- (present on admission) - Continue allopurinol  [name], type 2) ([place]) Glucoses are normal - Not a candidate for glipizide, do not represcribe at discharge       Subjective: Patient is feeling better, no fever, no sputum, no headache, no confusion, no chest pain, no swelling.  Objective Vital signs were reviewed and unremarkable. General appearance: Adult male, sitting up in recliner, no acute distress, interactive     HEENT: Anicteric, conjunctival pink, lids and lashes normal.  No nasal deformity, discharge, epistaxis Skin:  Cardiac: RRR, no murmurs, no lower extremity edema Respiratory: Normal respiratory rate and rhythm, lungs clear without rales or wheezes Abdomen:   MSK: Left BKA Neuro: Affect normal, judgment Syprine normal   Data Reviewed: Labs notable for creatinine down to 3.2, elevated glucose, sodium 132.  Hemoglobin stable in the 8  Family Communication: Spoke to brother by phone  Disposition: Status is: Inpatient  Remains inpatient appropriate because: Patient has severe renal failure, creatinine greater than 3.2, will stop IV fluids and monitor his creatinine, if it remains in trending in the right direction tomorrow, discharge home tomorrow.            Author: Edwin Dada 10/10/2021 5:51 PM  For on call review [email or web address].

## 2021-10-10 NOTE — Assessment & Plan Note (Addendum)
Stablized with fluids

## 2021-10-10 NOTE — Assessment & Plan Note (Signed)
Glucoses are normal - Not a candidate for glipizide, do not represcribe at discharge

## 2021-10-10 NOTE — Evaluation (Signed)
Physical Therapy Evaluation Patient Details Name: Thomas [name] MRN: [id number] DOB: 18-Apr-1951 Today's Date: 10/10/2021  History of Present Illness  70 y.o. male with chronic pain syndrome, stage IV CKD, type II DM, gout, hx PE and LLE PVT, HTN, and sCHF discharged from the hospital 6 days PTA (for AKI in the setting of right kidney hydronephrosis, acute respiratory failure with hypoxia in the setting of left lower lobe pneumonia, hypertensive emergency) who was found at home by home health RN confused.  Pt admitted 10/07/21 with hypoglycemia.  Clinical Impression  Pt admitted with above diagnosis. Pt currently with functional limitations due to the deficits listed below (see PT Problem List). Pt will benefit from skilled PT to increase their independence and safety with mobility to allow discharge to the venue listed below.  Pt with recent admission and SNF recommended however pt chose to d/c home.  Pt reports plan is to d/c home again this admission.  Pt pleasant and agreeable for OOB.  Pt declined ambulating at this time however able to don his prosthesis and assisted with transfer to recliner.       Recommendations for follow up therapy are one component of a multi-disciplinary discharge planning process, led by the attending physician.  Recommendations may be updated based on patient status, additional functional criteria and insurance authorization.  Follow Up Recommendations Home health PT    Assistance Recommended at Discharge Frequent or constant Supervision/Assistance  Functional Status Assessment Patient has had a recent decline in their functional status and demonstrates the ability to make significant improvements in function in a reasonable and predictable amount of time.  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    Transfers Overall transfer level: Needs assistance Equipment used: Rolling walker (2 wheels) Transfers: Sit to/from Stand Sit to Stand: Min assist;From elevated surface   Step pivot transfers: Min guard       General transfer comment: light assist to rise, verbal cues for hand placement    Ambulation/Gait               General Gait Details: pt declined today due to fatigue and first time OOB but agreeable to remain up in recliner end of session  Stairs            Wheelchair Mobility    Modified Rankin (Stroke Patients Only)       Balance Overall balance assessment: Needs assistance Sitting-balance support: No upper extremity supported;Feet supported Sitting balance-[name]: Good     Standing balance support: Bilateral upper extremity supported;Reliant on assistive device for balance;During functional activity Standing balance-[name]: Poor                               Pertinent Vitals/Pain Pain Assessment: Faces Faces Pain Scale: Hurts a little bit Pain Location: back Pain Descriptors / Indicators: Discomfort;Aching Pain Intervention(s): Monitored during session;Repositioned  Home Living Family/patient expects to be discharged to:: Private residence Living Arrangements: Alone   Type of Home: House   Entrance Stairs-Rails: None Entrance Stairs-Number of Steps: 2   Home Layout: One level Home Equipment: Conservation officer, nature (2 wheels);Cane - single point;Crutches      Prior Function Prior Level of Function : Independent/Modified Independent              Mobility Comments: pt uses prosthesis and RW For mobility in home, no walker out of home. he is driving and doing all ADL's/IADL's. he takes sink baths rather than showers due to no seat in the shower.       Hand Dominance        Extremity/Trunk Assessment        Lower Extremity Assessment RLE Deficits / Details: pt with BKA, residual limb intact able to manage liner and prosthesis without assist. LLE Deficits / Details: multiple small scabbing areas on lower leg LLE Sensation: history of peripheral neuropathy    Cervical / Trunk Assessment Cervical / Trunk Assessment: Normal  Communication   Communication: No difficulties  Cognition Arousal/Alertness: Awake/alert Behavior During Therapy: WFL for tasks assessed/performed Overall Cognitive Status: Within Functional Limits for tasks assessed                                          General Comments      Exercises     Assessment/Plan    PT Assessment Patient needs continued PT services  PT Problem List Decreased strength;Decreased activity tolerance;Decreased balance;Decreased mobility;Decreased knowledge of use of DME;Decreased safety awareness;Decreased knowledge of precautions;Obesity;Impaired sensation;Decreased skin integrity       PT Treatment Interventions DME instruction;Gait training;Stair training;Functional mobility training;Therapeutic activities;Balance training;Therapeutic exercise;Neuromuscular re-education;Patient/family education    PT Goals (Current goals can be found in the Care Plan section)  Acute Rehab PT Goals PT Goal Formulation: With patient Time For Goal Achievement: 10/24/21 Potential to Achieve Goals: Fair    Frequency Min 3X/week   Barriers to discharge        Co-evaluation               AM-PAC PT "6 Clicks" Mobility  Outcome Measure Help needed turning from your back to your side while in a flat bed without using bedrails?: A Little Help  needed moving from lying on your back to sitting on the side of a flat bed without using bedrails?: A Little Help needed moving to and from a bed to a chair (including a wheelchair)?: A Little Help needed standing up from a chair using your arms (e.g., wheelchair or bedside chair)?: A Little Help needed to walk in hospital room?: A Little Help needed climbing 3-5 steps with a railing? : Total 6 Click Score: 16    End of Session Equipment Utilized During Treatment: Gait belt Activity Tolerance: Patient tolerated treatment well Patient left: in chair;with call bell/phone within reach;with chair alarm set Nurse Communication: Mobility status PT Visit Diagnosis: Unsteadiness on feet (R26.81);Difficulty in walking, not elsewhere classified (R26.2);Muscle weakness (generalized) (M62.81)    Time: 5462-7035 PT Time Calculation (min) (ACUTE ONLY): 29 min   Charges:   PT Evaluation $PT Eval Moderate Complexity: 1 Mod PT Treatments $Therapeutic Activity: 8-22 mins       Jannette Spanner PT, DPT Acute Rehabilitation Services Pager: [phone] Office: [phone]   Myrtis Hopping [place] 10/10/2021, 12:35 PM

## 2021-10-10 NOTE — Assessment & Plan Note (Signed)
-   Continue Eliquis 

## 2021-10-10 NOTE — Assessment & Plan Note (Addendum)
-   Continue home OxyContin, Robaxin -Continue gabapentin as Cr improving

## 2021-10-10 NOTE — TOC Progression Note (Signed)
Transition of Care [facility]) - Progression Note    Patient Details  Name: Thomas [name] MRN: [id number] Date of Birth: 1951-03-17  [facility] CM/SW Contact  Purcell Mouton, RN Phone Number: 10/10/2021, 3:37 PM  Clinical Narrative:    Spoke with pt who will continue with [facility] for [facility] at discharge.    Expected Discharge Plan: [place] Barriers to Discharge: No Barriers Identified  Expected Discharge Plan and Services Expected Discharge Plan: [place] arrangements for the past 2 months: [place] Agency: [place] Date [place]: 10/10/21 Time [place]: [id number] Representative spoke with at [place]: [place] ([place]) Interventions    Readmission Risk Interventions Readmission Risk Prevention Plan 09/20/2021 09/17/2019  Transportation Screening Complete Complete  PCP or Specialist Appt within 5-7 Days - Not Complete  Not Complete comments - snf vs home  PCP or Specialist Appt within 3-5 Days Complete -  Home Care Screening - Complete  Medication Review (RN CM) - Complete  HRI or Home Care Consult Complete -  Social Work Consult for [place] Planning/Counseling Complete -  Palliative Care Screening Not Applicable -  Medication Review Press photographer) Complete -  Some recent data might be hidden

## 2021-10-10 NOTE — Assessment & Plan Note (Addendum)
Cr baseline 2.4.  Cr 4.64 mg/dL on admission.  4.55 > 3.8 > 3.2  Then oral fluid challenge and Cr up to 3.44, so fluids restarted and Cr 3.44 > 3.05 > 2.75 mg/dL today  Good uop.  No swelling, no dyspnea, no orthopnea, no DOE. -Continue IV fluids one more day    - Avoid nephrotoxins

## 2021-10-11 DIAGNOSIS — Z794 Long term (current) use of insulin: Secondary | ICD-10-CM

## 2021-10-11 DIAGNOSIS — N179 Acute kidney failure, unspecified: Secondary | ICD-10-CM | POA: Diagnosis not present

## 2021-10-11 DIAGNOSIS — E1169 Type 2 diabetes mellitus with other specified complication: Secondary | ICD-10-CM

## 2021-10-11 DIAGNOSIS — N184 Chronic kidney disease, stage 4 (severe): Secondary | ICD-10-CM | POA: Diagnosis not present

## 2021-10-11 DIAGNOSIS — I5042 Chronic combined systolic (congestive) and diastolic (congestive) heart failure: Secondary | ICD-10-CM | POA: Diagnosis not present

## 2021-10-11 DIAGNOSIS — G894 Chronic pain syndrome: Secondary | ICD-10-CM | POA: Diagnosis not present

## 2021-10-11 LAB — GLUCOSE, CAPILLARY
Glucose-Capillary: 112 mg/dL — ABNORMAL HIGH (ref 70–99)
Glucose-Capillary: 132 mg/dL — ABNORMAL HIGH (ref 70–99)
Glucose-Capillary: 138 mg/dL — ABNORMAL HIGH (ref 70–99)
Glucose-Capillary: 151 mg/dL — ABNORMAL HIGH (ref 70–99)
Glucose-Capillary: 189 mg/dL — ABNORMAL HIGH (ref 70–99)
Glucose-Capillary: 195 mg/dL — ABNORMAL HIGH (ref 70–99)
Glucose-Capillary: 206 mg/dL — ABNORMAL HIGH (ref 70–99)

## 2021-10-11 LAB — BASIC METABOLIC PANEL
Anion gap: 7 (ref 5–15)
BUN: 66 mg/dL — ABNORMAL HIGH (ref 8–23)
CO2: 24 mmol/L (ref 22–32)
Calcium: 7.9 mg/dL — ABNORMAL LOW (ref 8.9–10.3)
Chloride: 108 mmol/L (ref 98–111)
Creatinine, Ser: 3.44 mg/dL — ABNORMAL HIGH (ref 0.61–1.24)
GFR, Estimated: 19 mL/min — ABNORMAL LOW (ref >60)
Glucose, Bld: 125 mg/dL — ABNORMAL HIGH (ref 70–99)
Potassium: 5.1 mmol/L (ref 3.5–5.1)
Sodium: 139 mmol/L (ref 135–145)

## 2021-10-11 MED ORDER — LACTATED RINGERS IV SOLN: INTRAVENOUS | Status: DC

## 2021-10-11 NOTE — Progress Notes (Signed)
Physical Therapy Treatment Patient Details Name: Thomas [name] MRN: [id number] DOB: 1951/01/03 Today's Date: 10/11/2021   History of Present Illness 70 y.o. male with chronic pain syndrome, stage IV CKD, type II DM, gout, hx PE and LLE PVT, HTN, and sCHF discharged from the hospital 6 days PTA (for AKI in the setting of right kidney hydronephrosis, acute respiratory failure with hypoxia in the setting of left lower lobe pneumonia, hypertensive emergency) who was found at home by home health RN confused.  Pt admitted 10/07/21 with hypoglycemia.    PT Comments    Pt able to clean his sleeve (with alcohol as he does at home) and don prosthesis and shoe without assist.  Pt also ambulated short distance in hallway (fatigued quickly) and remained in recliner end of session.    Recommendations for follow up therapy are one component of a multi-disciplinary discharge planning process, led by the attending physician.  Recommendations may be updated based on patient status, additional functional criteria and insurance authorization.  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) , Lt foot wounds Restrictions Other Position/Activity Restrictions: prosthesis and shoe in room     Mobility  Bed Mobility Overal bed mobility: Modified Independent             General bed mobility comments: Pt was able to self don his right prothesis and left shoe at EOB; tends to pick at scabs (highly encouraged him not to do this as he reports he hates having scabs and always picks them) and caused one to bleed on left shin (applied gauze and paper tape after cold washcloth)    Transfers Overall transfer level: Needs  assistance Equipment used: Rolling walker (2 wheels) Transfers: Sit to/from Stand Sit to Stand: From elevated surface;Min guard           General transfer comment: min/guard for safety; pt mildly unsteady however able to self correct    Ambulation/Gait Ambulation/Gait assistance: Min guard Gait Distance (Feet): 40 Feet Assistive device: Rolling walker (2 wheels) Gait Pattern/deviations: Step-through pattern;Wide base of support;Decreased stride length       General Gait Details: distance per pt tolerance, pt denies symptoms   Stairs             Wheelchair Mobility    Modified Rankin (Stroke Patients Only)       Balance Overall balance assessment: Needs assistance         Standing balance support: Bilateral upper extremity supported;Reliant on assistive device for balance;During functional activity Standing balance-[name]: Poor                              Cognition Arousal/Alertness: Awake/alert Behavior During Therapy: WFL for tasks assessed/performed Overall Cognitive Status: Within Functional Limits for tasks assessed                                          Exercises      General Comments        Pertinent Vitals/Pain Pain Assessment: Faces Faces Pain Scale: Hurts a little bit Pain Location: back  Pain Descriptors / Indicators: Discomfort;Aching Pain Intervention(s): Monitored during session;Repositioned    Home Living                          Prior Function            PT Goals (current goals can now be found in the care plan section) Progress towards PT goals: Progressing toward goals    Frequency    Min 3X/week      PT Plan Current plan remains appropriate    Co-evaluation              AM-PAC PT "6 Clicks" Mobility   Outcome Measure  Help needed turning from your back to your side while in a flat bed without using bedrails?: A Little Help needed moving from lying on  your back to sitting on the side of a flat bed without using bedrails?: A Little Help needed moving to and from a bed to a chair (including a wheelchair)?: A Little Help needed standing up from a chair using your arms (e.g., wheelchair or bedside chair)?: A Little Help needed to walk in hospital room?: A Little Help needed climbing 3-5 steps with a railing? : A Lot 6 Click Score: 17    End of Session Equipment Utilized During Treatment: Gait belt Activity Tolerance: Patient tolerated treatment well Patient left: in chair;with call bell/phone within reach;with chair alarm set Nurse Communication: Mobility status PT Visit Diagnosis: Unsteadiness on feet (R26.81);Difficulty in walking, not elsewhere classified (R26.2)     Time: 3005-1102 PT Time Calculation (min) (ACUTE ONLY): 25 min  Charges:  $Gait Training: 8-22 mins $Therapeutic Activity: 8-22 mins                    Jannette Spanner PT, DPT Acute Rehabilitation Services Pager: [phone] Office: [place] 10/11/2021, 12:21 PM

## 2021-10-11 NOTE — Care Management Important Message (Signed)
Important Message  Patient Details IM Letter given to the Patient. Name: Thomas [name] MRN: [id number] Date of Birth: May 06, 1951   Medicare Important Message Given:  Yes     Kerin Salen 10/11/2021, 1:32 PM

## 2021-10-11 NOTE — Plan of Care (Signed)
Pt ambulated in room w/ PT. Pt in chair much of shift. MIVF restarted.   Problem: Education: Goal: Knowledge of General Education information will improve Description: Including pain rating scale, medication(s)/side effects and non-pharmacologic comfort measures Outcome: Progressing   Problem: Health Behavior/Discharge Planning: Goal: Ability to manage health-related needs will improve Outcome: Progressing   Problem: Clinical Measurements: Goal: Ability to maintain clinical measurements within normal limits will improve Outcome: Progressing Goal: Will remain free from infection Outcome: Progressing Goal: Diagnostic test results will improve Outcome: Progressing Goal: Respiratory complications will improve Outcome: Progressing Goal: Cardiovascular complication will be avoided Outcome: Progressing   Problem: Activity: Goal: Risk for activity intolerance will decrease Outcome: Progressing   Problem: Nutrition: Goal: Adequate nutrition will be maintained Outcome: Progressing   Problem: Coping: Goal: Level of anxiety will decrease Outcome: Progressing   Problem: Elimination: Goal: Will not experience complications related to bowel motility Outcome: Progressing Goal: Will not experience complications related to urinary retention Outcome: Progressing   Problem: Pain Managment: Goal: General experience of comfort will improve Outcome: Progressing   Problem: Safety: Goal: Ability to remain free from injury will improve Outcome: Progressing   Problem: Skin Integrity: Goal: Risk for impaired skin integrity will decrease Outcome: Progressing

## 2021-10-11 NOTE — Progress Notes (Signed)
°  Progress Note   Patient: Thomas [name] FAO:[id number] DOB: 02-17-1951 DOA: 10/07/2021     3 DOS: the patient was seen and examined on 10/11/2021   Brief hospital course: Mr. Leaming is a 70 y.o. M with chronic pain syndrome, stage IV CKD, type II DM, gout, hx PE and LLE PVT, HTN, and sCHF discharged from the hospital 6 days PTA (for AKI in the setting of right kidney hydronephrosis, acute respiratory failure with hypoxia in the setting of left lower lobe pneumonia, hypertensive emergency) who was found at home by home health RN confused, with hypoglycemia in the 76s.    12/20: Mentation improved but glucoses not stable 12/21: Cr trending down but still on IV fluids 12/23: Cr trending back up       Assessment and Plan AKI (acute kidney injury) ([place])- (present on admission) Cr baseline 2.4.  Cr >4 on admission.  Started fluids IV and Cr down to 3.2 yesterday, but in last 24 hours, Cr back up to 3.4 with only oral fliuds.  Still making large amounts of urine, I don't think this is obstruction.  - Resume IV fluids - Trend Cr - Check post-void residual - Avoid nephrotoxins   Hypothyroidism- (present on admission) - Continue levothyroxine  DVT (deep venous thrombosis) (HCC)- (present on admission) - Continue Eliquis  Chronic pain syndrome- (present on admission) - Continue home OxyContin, Robaxin -Continue gabapentin as Cr improving    BPH (benign prostatic hyperplasia)- (present on admission) - Check PVR - Continue Flomax, finasteride  Chronic combined systolic and diastolic CHF (congestive heart failure) ([place])- (present on admission) Not on diuretics at home, appears euvolemic - Continue metoprolol  HTN (hypertension)- (present on admission) BP near normal - Continue amlodipine, clonidine, hydralazine, metoprolol  Gout- (present on admission) - Continue allopurinol, renal dose  [name], type 2) (HCC) Glucoses are normal - Not a candidate  for glipizide, do not represcribe at discharge       Subjective: No fever, hematuria, confusion.  Some suprapubic pain.  No orthopnea or leg swelling.  Making good urine.  Objective Vital signs reviewed and remarkable for slight hypertension. General appearance: Elderly adult male, standing with physical therapy, no acute distress, interactive     HEENT: Anicteric, conjunctival pink, lids and lashes normal.  No nasal deformity, discharge, or epistaxis. Skin: Chronic venous stasis changes in the left leg. Cardiac: RRR, no murmurs, no lower extremity Respiratory: Normal respiratory rate and rhythm, lungs clear without rales or wheezes Abdomen: Mild suprapubic tenderness, no other tenderness, no distention MSK: Right BKA Neuro: Awake alert, extraocular movements intact, moves all extremities with normal strength and coordination, speech Psych: Normal affect normal, judgment Syprine normal   Data Reviewed: Labs notable for creatinine up to 3.4, BUN still elevated at 52  Family Communication:    Disposition: Status is: Inpatient  Remains inpatient appropriate because: He requires ongoing IV fluids for severe renal dysfunction           Author: Edwin Dada 10/11/2021 10:56 AM  For on call review [email or web address].

## 2021-10-12 DIAGNOSIS — G894 Chronic pain syndrome: Secondary | ICD-10-CM | POA: Diagnosis not present

## 2021-10-12 DIAGNOSIS — N184 Chronic kidney disease, stage 4 (severe): Secondary | ICD-10-CM | POA: Diagnosis not present

## 2021-10-12 DIAGNOSIS — I5042 Chronic combined systolic (congestive) and diastolic (congestive) heart failure: Secondary | ICD-10-CM | POA: Diagnosis not present

## 2021-10-12 DIAGNOSIS — N179 Acute kidney failure, unspecified: Secondary | ICD-10-CM | POA: Diagnosis not present

## 2021-10-12 LAB — CULTURE, BLOOD (ROUTINE X 2)
Culture: NO GROWTH
Culture: NO GROWTH

## 2021-10-12 LAB — GLUCOSE, CAPILLARY
Glucose-Capillary: 159 mg/dL — ABNORMAL HIGH (ref 70–99)
Glucose-Capillary: 135 mg/dL — ABNORMAL HIGH (ref 70–99)
Glucose-Capillary: 151 mg/dL — ABNORMAL HIGH (ref 70–99)
Glucose-Capillary: 168 mg/dL — ABNORMAL HIGH (ref 70–99)

## 2021-10-12 LAB — BASIC METABOLIC PANEL
Anion gap: 9 (ref 5–15)
BUN: 65 mg/dL — ABNORMAL HIGH (ref 8–23)
CO2: 23 mmol/L (ref 22–32)
Calcium: 8.1 mg/dL — ABNORMAL LOW (ref 8.9–10.3)
Chloride: 107 mmol/L (ref 98–111)
Creatinine, Ser: 3.05 mg/dL — ABNORMAL HIGH (ref 0.61–1.24)
GFR, Estimated: 22 mL/min — ABNORMAL LOW (ref >60)
Glucose, Bld: 147 mg/dL — ABNORMAL HIGH (ref 70–99)
Potassium: 5.1 mmol/L (ref 3.5–5.1)
Sodium: 139 mmol/L (ref 135–145)

## 2021-10-12 MED ORDER — BISACODYL 10 MG RE SUPP
10.0000 mg | Freq: Once | RECTAL | Status: AC
  Administered 2021-10-12: 12:00:00 10 mg via RECTAL
  Filled 2021-10-12: qty 1

## 2021-10-12 NOTE — Assessment & Plan Note (Addendum)
Presenting complaint, glucoses in the 30s. Resolved with holding glipizide and a dextrose infusion.  Glucose now stable. - Do not restart sulfonylurea at discharge

## 2021-10-12 NOTE — Progress Notes (Signed)
°  Progress Note   Patient: Thomas [name] CWC:[id number] DOB: [birth year] DOA: 10/07/2021     4 DOS: the patient was seen and examined on 10/12/2021   Brief hospital course: Mr. Arnold is a 70 y.o. M with chronic pain syndrome, stage IV CKD, type II DM, gout, hx PE and LLE PVT, HTN, and sCHF discharged from the hospital 6 days PTA (for AKI in the setting of right kidney hydronephrosis, acute respiratory failure with hypoxia in the setting of left lower lobe pneumonia, hypertensive emergency) who was found at home by home health RN confused, with hypoglycemia in the 53s.    12/20: Mentation improved but glucoses not stable 12/21: Glucoses stable.  Cr trending down 12/23: Failed PO fluid challenge, Cr back up, IV fluids restarted  Assessment and Plan * Acute metabolic encephalopathy- (present on admission) Due to hypoglycemia Resolved  Hydronephrosis of right kidney- (present on admission) Chronic  Hypothyroidism- (present on admission) - Continue levothyroxine  Hyperkalemia- (present on admission) Resolved with fluids  DVT (deep venous thrombosis) ([place])- (present on admission) - Continue Eliquis  Hypoglycemia- (present on admission) Resolved with holding glipizide and a dextrose infusion  AKI (acute kidney injury) ([place])- (present on admission) Cr baseline 2.4.  Cr >4 on admission.  Creatinine down to 3 today - Continue IV fluid - Avoid nephrotoxins  Chronic pain syndrome- (present on admission) - Continue home OxyContin, Robaxin -Continue gabapentin as Cr improving  CKD (chronic kidney disease), stage IV (HCC)- (present on admission) Patient has AKI on CKD stage IV, baseline creatinine 2.4  BPH (benign prostatic hyperplasia)- (present on admission) PVRs 100-200 range, okay - Continue Flomax, finasteride  Chronic combined systolic and diastolic CHF (congestive heart failure) ([place])- (present on admission) Not on diuretics at home, appears euvolemic - Continue  metoprolol  HTN (hypertension)- (present on admission) BP near normal - Continue amlodipine, clonidine, hydralazine, metoprolol  Gout- (present on admission) - Continue allopurinol, renal dose  [name], type 2) (HCC) Glucoses are normal - Not a candidate for glipizide, do not represcribe at discharge       Subjective: Feeling well.  No orthopnea, swelling, chest pain, dyspnea.  Objective Vital signs were reviewed and unremarkable. General appearance: Adult male, sitting up in bed, finishing breakfast, no acute distress.     HEENT:    Skin:  Cardiac: RRR, no murmurs, no lower extremity edema Respiratory: Normal respiratory rate and rhythm, lungs clear without rales or wheezes Abdomen: Abdomen soft no tenderness palpation or guarding, no ascites or distention MSK: Right BKA Neuro: Awake alert, extraocular movements intact, moves upper extremities normal strength and coordination, speech fluent Psych: Attention normal, affect normal, judgment insight appear normal   Data Reviewed: BMP remarkable for potassium normal/5.1, creatinine down to 3.05  Family Communication:    Disposition: Status is: Inpatient  Remains inpatient appropriate because: Requires ongoing IV fluids.  If creatinine down into the twos tomorrow, likely will stop IV fluids and discharge          Author: Edwin Dada 10/12/2021 10:41 AM  For on call review [email or web address]

## 2021-10-13 DIAGNOSIS — I5042 Chronic combined systolic (congestive) and diastolic (congestive) heart failure: Secondary | ICD-10-CM | POA: Diagnosis not present

## 2021-10-13 DIAGNOSIS — G894 Chronic pain syndrome: Secondary | ICD-10-CM | POA: Diagnosis not present

## 2021-10-13 DIAGNOSIS — N184 Chronic kidney disease, stage 4 (severe): Secondary | ICD-10-CM | POA: Diagnosis not present

## 2021-10-13 DIAGNOSIS — N179 Acute kidney failure, unspecified: Secondary | ICD-10-CM | POA: Diagnosis not present

## 2021-10-13 LAB — BASIC METABOLIC PANEL
Anion gap: 6 (ref 5–15)
BUN: 62 mg/dL — ABNORMAL HIGH (ref 8–23)
CO2: 24 mmol/L (ref 22–32)
Calcium: 8.2 mg/dL — ABNORMAL LOW (ref 8.9–10.3)
Chloride: 108 mmol/L (ref 98–111)
Creatinine, Ser: 2.75 mg/dL — ABNORMAL HIGH (ref 0.61–1.24)
GFR, Estimated: 24 mL/min — ABNORMAL LOW (ref >60)
Glucose, Bld: 120 mg/dL — ABNORMAL HIGH (ref 70–99)
Potassium: 5.1 mmol/L (ref 3.5–5.1)
Sodium: 138 mmol/L (ref 135–145)

## 2021-10-13 LAB — GLUCOSE, CAPILLARY
Glucose-Capillary: 103 mg/dL — ABNORMAL HIGH (ref 70–99)
Glucose-Capillary: 138 mg/dL — ABNORMAL HIGH (ref 70–99)
Glucose-Capillary: 125 mg/dL — ABNORMAL HIGH (ref 70–99)
Glucose-Capillary: 172 mg/dL — ABNORMAL HIGH (ref 70–99)

## 2021-10-13 MED ORDER — TRAMADOL HCL 50 MG PO TABS
50.0000 mg | ORAL_TABLET | Freq: Once | ORAL | Status: AC
  Administered 2021-10-13: 02:00:00 50 mg via ORAL
  Filled 2021-10-13: qty 1

## 2021-10-13 NOTE — Progress Notes (Signed)
Progress Note   Patient: Thomas [name] DOB: Dec 07, 1950 DOA: 10/07/2021     5 DOS: the patient was seen and examined on 10/13/2021     Brief hospital course: Thomas [name] is a 70 y.o. M with chronic pain syndrome, stage IV CKD, type II DM, gout, hx PE and LLE PVT, HTN, and sCHF discharged from the hospital 6 days PTA (for AKI in the setting of right kidney hydronephrosis, acute respiratory failure with hypoxia in the setting of left lower lobe pneumonia, hypertensive emergency) who was found at home by home health RN confused, with hypoglycemia in the 65s.    12/20: Mentation improved but glucoses not stable 12/21: Glucoses stable.  Cr trending down 12/23: Failed PO fluid challenge, Cr back up, IV fluids restarted 12/24-25: Cr improvin with fluids again      Assessment and Plan * AKI (acute kidney injury) ([facility])- (present on admission) Cr baseline 2.4.  Cr 4.64 mg/dL on admission.  4.55 > 3.8 > 3.2  Then oral fluid challenge and Cr up to 3.44, so fluids restarted and Cr 3.44 > 3.05 > 2.75 mg/dL today  Good uop.  No swelling, no dyspnea, no orthopnea, no DOE. -Continue IV fluids one more day    - Avoid nephrotoxins  Hyperkalemia- (present on admission) Stablized with fluids  CKD (chronic kidney disease), stage IV ([facility])- (present on admission) Patient has AKI on CKD stage IV, baseline creatinine 2.4  BPH (benign prostatic hyperplasia)- (present on admission) I do not think this is obstructive, as his PVRs are okay and his Cr coming down with fluids and FeNA low. - Continue Flomax, finasteride  Acute metabolic encephalopathy- (present on admission) Due to hypoglycemia Resolved  Hypoglycemia- (present on admission) Presenting complaint, glucoses in the 30s. Resolved with holding glipizide and a dextrose infusion.  Glucose now stable. - Do not restart sulfonylurea at discharge  HTN (hypertension)- (present on admission) BP near goal - Continue amlodipine,  clonidine, hydralazine, metoprolol  Chronic combined systolic and diastolic CHF (congestive heart failure) ([place])- (present on admission) Not on diuretics at home, appears euvolemic - Continue metoprolol  [name], type 2) ([facility]) Glucoses are normal - Not a candidate for glipizide, do not represcribe at discharge    DVT (deep venous thrombosis) ([place])- (present on admission) - Continue Eliquis  Hypothyroidism- (present on admission) - Continue levothyroxine  Hydronephrosis of right kidney- (present on admission) Chronic  Gout- (present on admission) - Continue allopurinol, renal dose  Chronic pain syndrome- (present on admission) - Continue home OxyContin, Robaxin -Continue gabapentin as Cr improving     Subjective: Patient feels well he has no orthopnea, swelling, dyspnea on exertion.  He is making good urine.  He said no fever.  He has no flank pain.  Objective Vital signs were reviewed and unremarkable. General appearance: Elderly adult male, sitting up in bed, eating breakfast, no acute distress.     HEENT: Anicteric, conjunctive pink, lids and lashes normal.  No nasal deformity, discharge, or epistaxis. Skin:  Cardiac: RRR, no murmurs, no lower extremity edema, chronic venous stasis changes in the left leg. Respiratory: Normal respiratory rate and rhythm, lungs clear without rales or wheezes Abdomen: Abdomen soft, mild suprapubic tenderness, no distention. MSK: Right BKA Neuro: Awake and alert, extraocular movements intact, moves upper extremities with normal strength and crenation, speech fluent, face symmetric Psych: Attention normal, affect normal, judgment Syprine normal    Data Reviewed: Labs notable for creatinine down to 2.7, potassium still normal.  Glucose is excellently controlled  Family Communication:    Disposition: Status is: Inpatient  Remains inpatient appropriate because: Requires ongoing IV fluids, hopefully creatinine down  tomorrow we will discharge           Author: Edwin [name] 10/13/2021 10:57 AM  For on call review [email or web address].

## 2021-10-13 NOTE — Plan of Care (Signed)
  Problem: Clinical Measurements: Goal: Diagnostic test results will improve Outcome: Progressing Goal: Respiratory complications will improve Outcome: Progressing Goal: Cardiovascular complication will be avoided Outcome: Progressing   

## 2021-10-14 DIAGNOSIS — N179 Acute kidney failure, unspecified: Secondary | ICD-10-CM | POA: Diagnosis not present

## 2021-10-14 LAB — GLUCOSE, CAPILLARY
Glucose-Capillary: 125 mg/dL — ABNORMAL HIGH (ref 70–99)
Glucose-Capillary: 131 mg/dL — ABNORMAL HIGH (ref 70–99)
Glucose-Capillary: 142 mg/dL — ABNORMAL HIGH (ref 70–99)
Glucose-Capillary: 143 mg/dL — ABNORMAL HIGH (ref 70–99)
Glucose-Capillary: 149 mg/dL — ABNORMAL HIGH (ref 70–99)
Glucose-Capillary: 171 mg/dL — ABNORMAL HIGH (ref 70–99)

## 2021-10-14 LAB — BASIC METABOLIC PANEL
Anion gap: 5 (ref 5–15)
BUN: 55 mg/dL — ABNORMAL HIGH (ref 8–23)
CO2: 25 mmol/L (ref 22–32)
Calcium: 7.8 mg/dL — ABNORMAL LOW (ref 8.9–10.3)
Chloride: 108 mmol/L (ref 98–111)
Creatinine, Ser: 2.88 mg/dL — ABNORMAL HIGH (ref 0.61–1.24)
GFR, Estimated: 23 mL/min — ABNORMAL LOW (ref >60)
Glucose, Bld: 160 mg/dL — ABNORMAL HIGH (ref 70–99)
Potassium: 5.4 mmol/L — ABNORMAL HIGH (ref 3.5–5.1)
Sodium: 138 mmol/L (ref 135–145)

## 2021-10-14 MED ORDER — OXYCONTIN 30 MG PO T12A: 30.0000 mg | EXTENDED_RELEASE_TABLET | Freq: Two times a day (BID) | ORAL | 0 refills | Status: DC

## 2021-10-14 MED ORDER — OXYCODONE-ACETAMINOPHEN 5-325 MG PO TABS
1.0000 | ORAL_TABLET | Freq: Four times a day (QID) | ORAL | Status: DC | PRN
  Filled 2021-10-14: qty 1

## 2021-10-14 MED ORDER — HYDROMORPHONE HCL 1 MG/ML IJ SOLN
0.5000 mg | INTRAMUSCULAR | Status: DC | PRN
  Administered 2021-10-14: 11:00:00 0.5 mg via INTRAVENOUS
  Filled 2021-10-14: qty 0.5

## 2021-10-14 NOTE — Discharge Summary (Signed)
Physician Discharge Summary   Patient: Thomas [name] MRN: [id number] DOB: @DOB   Admit date:     10/07/2021  Discharge date: 10/14/21  Discharge Physician: Edwin Dada   PCP: Antonietta Jewel, MD   Recommendations at discharge: 1. Follow up with Dr. Sheryle Hail in 1-2 weeks 2. Follow up with Nephrology, Dr. Willene Hatchet in 1 week 3. Dr. Willene Hatchet: Please check Cr and K in 5-7 days            Discharge Diagnoses Principal Problem:   Acute kidney injury on chronic kidney disease stage IV Active Problems:   Acute metabolic encephalopathy due to Hypoglycemia   Hyperkalemia   HTN (hypertension)   [name], type 2) (HCC)   Chronic combined systolic and diastolic CHF (congestive heart failure) (HCC)   DVT (deep venous thrombosis) (HCC)   Hypothyroidism   Chronic hydronephrosis of right kidney   Gout   Chronic pain syndrome   BPH (benign prostatic hyperplasia)    Hospital Course   Thomas [name] is a 70 y.o. M with chronic pain syndrome, stage IV CKD, type II DM, gout, hx PE and LLE PVT, HTN, and sCHF discharged from the hospital 6 days PTA (for AKI in the setting of right kidney hydronephrosis, acute respiratory failure with hypoxia in the setting of left lower lobe pneumonia, hypertensive emergency) who was found at home by home health RN confused, with hypoglycemia in the 21s.    In the ER, found to have glucose in the 30s, creatinine >4.5 mg/dL and confused.  Admitted and started on dextrose infusion and IV fluids.         * AKI (acute kidney injury) ([place])- (present on admission) Hyperkalemia- (present on admission) CKD (chronic kidney disease), stage IV Baseline creatinine appears to be 2.4 to 3 mg/dL since around 2019.    Cr 4.64 mg/dL on admission.  4.55 > 3.8 > 3.2.  His creatinine increased with an oral fluid challenge, and so IV fluids were restarted and the patient's creatinine improved to 2.7-2.9 at the time of discharge.  No swelling,  no dyspnea, no orthopnea, no DOE.    Acute metabolic encephalopathy- (present on admission) On admission was sluggish and disoriented due to hypoglycemia.  Resolved with resolution of hypoglycemia.  [name], type 2) (HCC) Hypoglycemia- (present on admission) This was due to use of sulfonylurea in setting of renal failure.  This was stopped at discharge.    Patient's glucoses remained in the 120-180 range throughout his hospital stay, wtihout antiglycemics.   - Do not restart sulfonylurea   HTN (hypertension)- (present on admission)  Chronic combined systolic and diastolic CHF (congestive heart failure) ([place])- (present on admission) Not on diuretics at home, appeared euvolemic here.   BPH (benign prostatic hyperplasia)- (present on admission) I do not think this is obstructive, as his PVRs are okay and his Cr coming down with fluids and FeNA low.   DVT (deep venous thrombosis) Hypercoagulable state on Eliquis  Hypothyroidism- (present on admission)  Hydronephrosis of right kidney Chronic  Gout Chronic pain syndrome- (present on admission)      Pain control - [place] Controlled Substance Reporting System database was reviewed. and patient was instructed, not to drive, operate heavy machinery, perform activities at heights, swimming or participation in water activities or provide baby-sitting services while on Pain, Sleep and Anxiety Medications; until their outpatient Physician has advised to do so again. Also recommended to not to take more than prescribed Pain,  Sleep and Anxiety Medications.      Disposition: Home health Diet recommendation: Carb modified diet  DISCHARGE MEDICATION: Allergies as of 10/14/2021       Reactions   Other Other (See Comments)   Blood pressure issues Per [facility] hospital: Pt states he can only take these pain meds or else he gets very sick: Oxycontin,morphine,demerol, and dilaudid are the only pain meds pt  states he can take.    Oxymorphone Other (See Comments)   Causes kidney problems   Aspirin Nausea And Vomiting   Per [facility]   Beta Vulgaris Nausea And Vomiting   Beets   Buspirone Other (See Comments)   Unknown reaction   Cabbage Nausea And Vomiting   Codeine Other (See Comments)   unknown   Dolobid [diflunisal]    Unknown reaction   Fish Allergy Nausea And Vomiting   Fish-derived Products Nausea And Vomiting   Methadone    Unknown reaction   Pentazocine Other (See Comments)   Unknown reaction   Propoxyphene Other (See Comments)   Unknown reaction   Shellfish Allergy Nausea And Vomiting   Sulfa Antibiotics Hives   Sulfasalazine Other (See Comments)   Unknown reaction   Vistaril [hydroxyzine]    Unknown reaction   Amoxicillin Rash   Per [facility]        Medication List     STOP taking these medications    glipiZIDE 10 MG tablet Commonly known as: GLUCOTROL       TAKE these medications    acetaminophen 500 MG tablet Commonly known as: TYLENOL Take 2 tablets (1,000 mg total) by mouth every 8 (eight) hours. What changed:  when to take this reasons to take this   allopurinol 100 MG tablet Commonly known as: ZYLOPRIM Take 100 mg by mouth daily.   amLODipine 5 MG tablet Commonly known as: NORVASC Take 5 tablets by mouth daily.   apixaban 5 MG Tabs tablet Commonly known as: ELIQUIS Take 1 tablet by mouth 2 times daily. Start taking on: October 22, 2021   atorvastatin 80 MG tablet Commonly known as: LIPITOR Take 1 tablet (80 mg total) by mouth daily.   cloNIDine 0.3 mg/24hr patch Commonly known as: CATAPRES - Dosed in mg/24 hr Place 1 patch onto the skin once a week.   colchicine 0.6 MG tablet Take 0.6 mg by mouth daily.   cyanocobalamin 100 MCG tablet Take 2 tablets by mouth daily.   ferrous sulfate 325 (65 FE) MG tablet Take 1 tablet by mouth daily with breakfast.   finasteride 5 MG tablet Commonly known as: PROSCAR Take  1 tablet (5 mg total) by mouth daily.   gabapentin 300 MG capsule Commonly known as: NEURONTIN Take 1 capsule (300 mg total) by mouth at bedtime. What changed: when to take this   hydrALAZINE 100 MG tablet Commonly known as: APRESOLINE Take 1 tablet by mouth every 8 hours.   lactulose 10 GM/15ML solution Commonly known as: CHRONULAC Take 15 mLs (10 g total) by mouth 2 (two) times daily.   levothyroxine 50 MCG tablet Commonly known as: SYNTHROID Take 50 mcg by mouth daily before breakfast.   methocarbamol 750 MG tablet Commonly known as: ROBAXIN Take 1 tablet (750 mg total) by mouth every 8 (eight) hours as needed for muscle spasms.   metoprolol tartrate 50 MG tablet Commonly known as: LOPRESSOR Take 1 tablet by mouth 2 times daily.   naloxegol oxalate 12.5 MG Tabs tablet Commonly known as: MOVANTIK Take 1 tablet  by mouth daily.   OxyCONTIN 30 MG 12 hr tablet Generic drug: oxyCODONE Take 1 tablet (30 mg total) by mouth 2 (two) times daily. What changed: when to take this   pantoprazole 40 MG tablet Commonly known as: PROTONIX Take 1 tablet by mouth 2 times daily.   polyethylene glycol 17 g packet Commonly known as: MiraLax Take 17 g by mouth daily as needed.   saccharomyces boulardii 250 MG capsule Commonly known as: FLORASTOR Take 1 capsule by mouth 2 times daily.   tamsulosin 0.4 MG Caps capsule Commonly known as: FLOMAX Take 1 capsule (0.4 mg total) by mouth daily.        Follow-up Information     Antonietta Jewel, MD. Schedule an appointment as soon as possible for a visit in 1 week(s).   Specialty: Internal Medicine Contact information: [address] [place] [address] [phone]         Adegoroye, Wynona Luna, MD. Schedule an appointment as soon as possible for a visit in 1 week(s).   Specialty: Specialist Contact information: [address] [place] [address] [phone]                Discharge  Instructions     Diet - low sodium heart healthy   Complete by: As directed    Discharge instructions   Complete by: As directed    From Dr. Loleta Books: You were admitted for low blood sugar and kidney failure. The kidney failure was mild and has recovered.  The low blood sugar happened because of the medicine glipizide. When you get home, do NOT take glipizide. Go see your primary care doctor Dr. Sheryle Hail as soon as you can  For your kidneys, your kidney function is getting worse. Go see your kidney doctor, Dr. Duwaine Maxin in 1 week, have him check your lab work Resume your normal blood pressure medicines   Increase activity slowly   Complete by: As directed        Discharge Exam: Filed Weights   10/07/21 2332  Weight: 124.1 kg   General: Pt is alert, awake, not in acute distress, sitting up in bed Cardiovascular: RRR, nl S1-S2, no murmurs appreciated.   Chronic venous stasis change but no new LE edema in left leg, BKA on right.   Respiratory: Normal respiratory rate and rhythm.  CTAB without rales or wheezes. Abdominal: Abdomen soft and non-tender.  No distension or HSM.   Neuro/Psych: Strength symmetric in upper and lower extremities.  Judgment and insight appear normal.   Condition at discharge: stable  The results of significant diagnostics from this hospitalization (including imaging, microbiology, ancillary and laboratory) are listed below for reference.   Imaging Studies: CT ABDOMEN PELVIS WO CONTRAST  Result Date: 09/23/2021 CLINICAL DATA:  Abdominal distension. EXAM: CT ABDOMEN AND PELVIS WITHOUT CONTRAST TECHNIQUE: Multidetector CT imaging of the abdomen and pelvis was performed following the standard protocol without IV contrast. COMPARISON:  September 17, 2021. FINDINGS: Lower chest: Small bilateral pleural effusions are noted. Stable left lower lobe opacity is noted concerning for pneumonia. Hepatobiliary: No focal liver abnormality is seen. No gallstones, gallbladder  wall thickening, or biliary dilatation. Pancreas: Unremarkable. No pancreatic ductal dilatation or surrounding inflammatory changes. Spleen: Normal in size without focal abnormality. Adrenals/Urinary Tract: Adrenal glands appear normal. Moderate to severe right hydroureteronephrosis is noted without evidence of obstructing calculus. Moderate right renal cortical atrophy is noted. Transition zone is seen in the distal right ureter in the pelvis concerning  for stricture. Left kidney is unremarkable. Mild urinary bladder distention is noted. Stomach/Bowel: Stomach appears normal. There is no evidence of bowel obstruction or inflammation. Vascular/Lymphatic: Aortic atherosclerosis. No enlarged abdominal or pelvic lymph nodes. IVC filter is noted in infrarenal position. Reproductive: Prostate is unremarkable. Other: No abdominal wall hernia or abnormality. No abdominopelvic ascites. Musculoskeletal: No acute or significant osseous findings. IMPRESSION: Small bilateral pleural effusions are noted. Stable left lower lobe opacity is noted concerning for pneumonia. Moderate to severe right hydroureteronephrosis is noted without evidence of obstructing calculus which is increased compared to prior exam. Moderate right renal cortical atrophy is noted suggesting longstanding obstruction. Transition zone is seen in the distal right ureter in the pelvis concerning for stricture. Aortic Atherosclerosis (ICD10-I70.0). Electronically Signed   By: Marijo Conception M.D.   On: 09/23/2021 12:27   DG Chest 2 View  Result Date: 10/07/2021 CLINICAL DATA:  Weakness EXAM: CHEST - 2 VIEW COMPARISON:  09/23/2021 FINDINGS: Stable cardiomegaly. Prior median sternotomy. Pulmonary vascular congestion with diffuse bilateral interstitial prominence. No large pleural fluid collection. No pneumothorax. IMPRESSION: Findings suggestive of CHF with interstitial edema. Electronically Signed   By: Davina Poke D.O.   On: 10/07/2021 14:19   DG Abd  1 View  Result Date: 09/23/2021 CLINICAL DATA:  Intubation. EXAM: PORTABLE CHEST 1 VIEW COMPARISON:  Chest radiograph dated 09/16/2021. FINDINGS: Endotracheal tube with tip approximately 3.5 cm above the carina. Enteric tube extends below the diaphragm with tip and side-port in the left upper abdomen likely in the proximal stomach. Diffuse interstitial coarsening and streaky densities which may represent chronic changes. Interval improvement of the previously seen left lung base density. Residual infiltrate is not excluded no pleural effusion pneumothorax. Stable cardiomegaly. Median sternotomy wires. Atherosclerotic calcification of the aorta. Nonspecific bowel gas pattern. Air is noted within the colon. An IVC filter is noted. Osteopenia with degenerative changes of the spine and spinal fusion hardware. No acute osseous pathology. IMPRESSION: Endotracheal tube with tip above the carina. Enteric tube extends below the diaphragm with tip and side-port in the proximal stomach. Electronically Signed   By: Anner Crete M.D.   On: 09/23/2021 19:17   CT Head Wo Contrast  Result Date: 10/07/2021 CLINICAL DATA:  Altered mental status. EXAM: CT HEAD WITHOUT CONTRAST TECHNIQUE: Contiguous axial images were obtained from the base of the skull through the vertex without intravenous contrast. COMPARISON:  September 23, 2021. FINDINGS: Brain: No evidence of acute infarction, hemorrhage, hydrocephalus, extra-axial collection or mass lesion/mass effect. Vascular: No hyperdense vessel or unexpected calcification. Skull: Normal. Negative for fracture or focal lesion. Sinuses/Orbits: No acute finding. Other: None. IMPRESSION: No acute intracranial abnormality seen. Electronically Signed   By: Marijo Conception M.D.   On: 10/07/2021 14:27   CT HEAD WO CONTRAST (5MM)  Result Date: 09/23/2021 CLINICAL DATA:  Delirium EXAM: CT HEAD WITHOUT CONTRAST TECHNIQUE: Contiguous axial images were obtained from the base of the skull  through the vertex without intravenous contrast. COMPARISON:  CT head August 24, 2021. FINDINGS: Motion limited study.  Within this limitation: Brain: New hypodensity in the left occipital lobe (series 3, image 12; series 5, image 40). No acute hemorrhage a mass lesion, midline shift, or extra-axial fluid collection. Similar small hypodensity in the right basal ganglia, likely a remote lacunar infarct or dilated perivascular space. Similar atrophy. Vascular: No obvious hyperdense vessel with limited evaluation. Skull: No acute fracture. Sinuses/Orbits: Clear sinuses.  Unremarkable orbits. Other: No mastoid effusions IMPRESSION: Motion limited study with new  hypoattenuation in the left occipital lobe. While this could represent artifact, acute infarct is not excluded. If the patient is able, MRI could provide more sensitive evaluation for acute infarct. Electronically Signed   By: Margaretha Sheffield M.D.   On: 09/23/2021 12:31   MR THORACIC SPINE WO CONTRAST  Result Date: 09/17/2021 CLINICAL DATA:  Back pain. History of thoracolumbar fusion. 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 EXAM: MRI THORACIC SPINE WITHOUT CONTRAST TECHNIQUE: Multiplanar, multisequence MR imaging of the thoracic spine was performed. No intravenous contrast was administered. COMPARISON:  CT scan 09/17/2021 and prior thoracic spine MRI 08/30/2020 FINDINGS: Examination is limited by artifact from the thoracolumbar hardware and patient motion. Alignment: Normal overall alignment. Vertebrae: Normal marrow signal. No bone lesions or fractures. Chronic changes at T10-11 but no findings suspicious for active discitis or osteomyelitis. Cord: Grossly normal cord signal intensity. No obvious cord lesions. Paraspinal and other soft tissues: No significant paraspinal findings. No paraspinal abscess. Disc levels: Postoperative changes with posterior fusion hardware from T10-T12. No obvious complicating features are identified. No  obvious spinal or foraminal stenosis in this area. Shallow left paracentral disc protrusion at T8-9 with mild flattening of the thecal sac. No foraminal stenosis. No other thoracic disc protrusions, spinal or foraminal stenosis identified. IMPRESSION: 1. Postoperative changes with posterior fusion hardware from T10-T12. No obvious complicating features are identified. 2. Chronic changes at T10-11 without findings suspicious for recurrent/active discitis/osteomyelitis. 3. Shallow left paracentral disc protrusion at T8-9 with mild flattening of the thecal sac. 4. No other thoracic disc protrusions, spinal or foraminal stenosis. Electronically Signed   By: Marijo Sanes M.D.   On: 09/17/2021 09:33   US RENAL  Result Date: 10/08/2021 CLINICAL DATA:  AK I EXAM: RENAL / URINARY TRACT ULTRASOUND COMPLETE COMPARISON:  September 30, 2021 FINDINGS: Right Kidney: Renal measurements: 13.9 x 5.9 x 7.0 cm = volume: 298 mL. Severe right hydronephrosis and cortical thinning. Left Kidney: Renal measurements: 12.8 x 6 1 x 6.9 cm = volume: 281 mL. Echogenicity within normal limits. No mass or hydronephrosis visualized. Bladder: Appears normal for degree of bladder distention. Other: None. IMPRESSION: Severe right hydronephrosis and cortical thinning, unchanged compared to prior. Electronically Signed   By: Yetta Glassman M.D.   On: 10/08/2021 12:35   US RENAL  Result Date: 09/30/2021 CLINICAL DATA:  Hydronephrosis EXAM: RENAL / URINARY TRACT ULTRASOUND COMPLETE COMPARISON:  None. FINDINGS: Right Kidney: Renal measurements: 11.2 x 5.2 x 6.0 cm = volume: 182 mL. Echogenicity within normal limits. Severe hydronephrosis and proximal hydroureter. No calculi or other obstruction identified. Left Kidney: Renal measurements: 13.1 x 6.3 x 6.7 cm = volume: 288 mL. Echogenicity within normal limits. No mass or hydronephrosis visualized. Bladder: Decompressed by Foley catheter. Other: None. IMPRESSION: 1. Severe right hydronephrosis  and proximal hydroureter, as seen by prior CT. No calculi or other obstruction identified. 2. No left hydronephrosis. 3. Bladder decompressed by Foley catheter. Electronically Signed   By: Delanna Ahmadi M.D.   On: 09/30/2021 16:49   MR FOOT LEFT WO CONTRAST  Result Date: 09/17/2021 CLINICAL DATA:  Osteomyelitis, foot EXAM: MRI OF THE LEFT FOOT WITHOUT CONTRAST TECHNIQUE: Multiplanar, multisequence MR imaging of the left foot was performed. No intravenous contrast was administered. COMPARISON:  MRI 04/06/2020, radiograph 04/05/2020 FINDINGS: Bones/Joint/Cartilage Unchanged severe arthritis with chronic erosive changes of the distal first and second metatarsals and adjacent proximal phalanges. Unchanged severe arthritis and erosive changes of the third through fifth digits. Unchanged posttraumatic or severe arthritic deformity  of the distal fifth metatarsal. There is preserved T1 signal throughout the foot. There is mild paratracheal bony edema within the second digit phalanges. Unchanged severe arthritis of the tarsometatarsal joints. There is periarticular bony edema throughout the midfoot likely related to arthritis. Ligaments Multiple disrupted MCP and interphalangeal joint ligaments. Muscles and Tendons Diffuse atrophy in the foot and swelling as is commonly seen in diabetics. Soft tissues Diffuse soft tissue swelling. No focal fluid collection on noncontrast exam. IMPRESSION: Mild periarticular bony edema within the second digit phalanges with preserved T1 marrow signal. This is favored to be related to severe arthritis/reactive marrow change, though if there is an adjacent soft tissue ulcer, early osteomyelitis would be possible. No other evidence of osteomyelitis in the foot. Diffuse soft tissue swelling. No evidence of soft tissue abscess on noncontrast exam. 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 Electronically Signed   By: Maurine Simmering M.D.   On: 09/17/2021  09:12   Portable Chest x-ray  Result Date: 09/23/2021 CLINICAL DATA:  Intubation. EXAM: PORTABLE CHEST 1 VIEW COMPARISON:  Chest radiograph dated 09/16/2021. FINDINGS: Endotracheal tube with tip approximately 3.5 cm above the carina. Enteric tube extends below the diaphragm with tip and side-port in the left upper abdomen likely in the proximal stomach. Diffuse interstitial coarsening and streaky densities which may represent chronic changes. Interval improvement of the previously seen left lung base density. Residual infiltrate is not excluded no pleural effusion pneumothorax. Stable cardiomegaly. Median sternotomy wires. Atherosclerotic calcification of the aorta. Nonspecific bowel gas pattern. Air is noted within the colon. An IVC filter is noted. Osteopenia with degenerative changes of the spine and spinal fusion hardware. No acute osseous pathology. IMPRESSION: Endotracheal tube with tip above the carina. Enteric tube extends below the diaphragm with tip and side-port in the proximal stomach. Electronically Signed   By: Anner Crete M.D.   On: 09/23/2021 19:17   DG Chest Port 1 View  Result Date: 09/17/2021 CLINICAL DATA:  Shortness of breath EXAM: PORTABLE CHEST 1 VIEW COMPARISON:  08/24/2021 FINDINGS: Left basilar consolidation is new since the prior study. Mild cardiomegaly. Shallow lung inflation. IMPRESSION: Left basilar consolidation, likely pneumonia. Electronically Signed   By: Ulyses Jarred M.D.   On: 09/17/2021 00:01   DG Abd Portable 1V  Result Date: 09/21/2021 CLINICAL DATA:  Nausea and vomiting, abdominal pain. EXAM: PORTABLE ABDOMEN - 1 VIEW COMPARISON:  Plain film of the abdomen dated 09/15/2020. FINDINGS: Visualized bowel gas pattern is nonobstructive. No evidence of soft tissue mass or abnormal fluid collection. No evidence of free intraperitoneal air. There is a dense opacity at the LEFT lung base, similar to chest x-ray of 09/16/2021, suspected pneumonia. IMPRESSION: 1.  Nonobstructive bowel gas pattern and no evidence of acute intra-abdominal abnormality. 2. Probable LEFT lower lobe pneumonia. Electronically Signed   By: Franki Cabot M.D.   On: 09/21/2021 14:22   ECHOCARDIOGRAM COMPLETE  Result Date: 09/24/2021    ECHOCARDIOGRAM REPORT   Patient Name:   ERIN UECKER Date of Exam: 09/24/2021 Medical Rec #:  [id number]       Height:       78.0 in Accession #:    [id number]      Weight:       261.5 lb Date of Birth:  1951/03/29       BSA:          2.530 m Patient Age:    70 years  BP:           172/105 mmHg Patient Gender: M               HR:           94 bpm. Exam Location:  Inpatient Procedure: 2D Echo, Color Doppler and Cardiac Doppler Indications:    R06.9 DOE  History:        Patient has prior history of Echocardiogram examinations, most                 recent 06/17/2021. CHF; Risk Factors:Hypertension and Diabetes.                 Prior performed at [facility].  Sonographer:    [place] Referring Phys: Rote Comments: See sonographer comments IMPRESSIONS  1. Left ventricular ejection fraction, by estimation, is 35 to 40%. The left ventricle has moderately decreased function. The left ventricle demonstrates global hypokinesis. There is moderate concentric left ventricular hypertrophy. Left ventricular diastolic parameters are indeterminate.  2. Right ventricular systolic function is normal. The right ventricular size is moderately enlarged. There is severely elevated pulmonary artery systolic pressure.  3. Left atrial size was moderately dilated.  4. Right atrial size was mildly dilated.  5. The mitral valve is abnormal. Moderate mitral valve regurgitation.  6. Tricuspid valve regurgitation is mild to moderate.  7. The aortic valve is tricuspid. There is mild calcification of the aortic valve. Aortic valve regurgitation is not visualized.  8. Aortic dilatation noted. There is mild dilatation of the ascending aorta,  measuring 42 mm.  9. The inferior vena cava is dilated in size with >50% respiratory variability, suggesting right atrial pressure of 8 mmHg. Comparison(s): No prior Echocardiogram. FINDINGS  Left Ventricle: Left ventricular ejection fraction, by estimation, is 35 to 40%. The left ventricle has moderately decreased function. The left ventricle demonstrates global hypokinesis. The left ventricular internal cavity size was normal in size. There is moderate concentric left ventricular hypertrophy. Left ventricular diastolic parameters are indeterminate. Right Ventricle: The right ventricular size is moderately enlarged. No increase in right ventricular wall thickness. Right ventricular systolic function is normal. There is severely elevated pulmonary artery systolic pressure. The tricuspid regurgitant velocity is 3.73 m/s, and with an assumed right atrial pressure of 8 mmHg, the estimated right ventricular systolic pressure is 63.8 mmHg. Left Atrium: Left atrial size was moderately dilated. Right Atrium: Right atrial size was mildly dilated. Pericardium: There is no evidence of pericardial effusion. Mitral Valve: The mitral valve is abnormal. Moderate mitral valve regurgitation. Tricuspid Valve: The tricuspid valve is normal in structure. Tricuspid valve regurgitation is mild to moderate. No evidence of tricuspid stenosis. Aortic Valve: The aortic valve is tricuspid. There is mild calcification of the aortic valve. Aortic valve regurgitation is not visualized. Pulmonic Valve: The pulmonic valve was not well visualized. Pulmonic valve regurgitation is not visualized. No evidence of pulmonic stenosis. Aorta: Aortic dilatation noted. There is mild dilatation of the ascending aorta, measuring 42 mm. Venous: The inferior vena cava is dilated in size with greater than 50% respiratory variability, suggesting right atrial pressure of 8 mmHg. IAS/Shunts: The atrial septum is grossly normal.  LEFT VENTRICLE PLAX 2D LVIDd:          5.00 cm LVIDs:         3.90 cm LV PW:         1.40 cm LV IVS:        1.40  cm LVOT diam:     2.20 cm LV SV:         52 LV SV Index:   20 LVOT Area:     3.80 cm  LV Volumes (MOD) LV vol d, MOD A2C: 141.0 ml LV vol d, MOD A4C: 165.0 ml LV vol s, MOD A2C: 94.4 ml LV vol s, MOD A4C: 101.0 ml LV SV MOD A2C:     46.6 ml LV SV MOD A4C:     165.0 ml LV SV MOD BP:      55.4 ml RIGHT VENTRICLE RV S prime:     11.60 cm/s TAPSE (M-mode): 1.7 cm LEFT ATRIUM              Index        RIGHT ATRIUM           Index LA diam:        4.30 cm  1.70 cm/m   RA Area:     25.20 cm LA Vol (A2C):   106.0 ml 41.90 ml/m  RA Volume:   88.50 ml  34.98 ml/m LA Vol (A4C):   117.0 ml 46.25 ml/m LA Biplane Vol: 115.0 ml 45.46 ml/m  AORTIC VALVE LVOT Vmax:   69.20 cm/s LVOT Vmean:  52.300 cm/s LVOT VTI:    0.136 m  AORTA Ao Root diam: 3.60 cm Ao Asc diam:  4.20 cm TRICUSPID VALVE TR Peak grad:   55.7 mmHg TR Vmax:        373.00 cm/s  SHUNTS Systemic VTI:  0.14 m Systemic Diam: 2.20 cm Rudean Haskell MD Electronically signed by Rudean Haskell MD Signature Date/Time: 09/24/2021/4:12:44 PM    Final    CT Renal Stone Study  Result Date: 09/17/2021 CLINICAL DATA:  Acute kidney injury EXAM: CT ABDOMEN AND PELVIS WITHOUT CONTRAST TECHNIQUE: Multidetector CT imaging of the abdomen and pelvis was performed following the standard protocol without IV contrast. COMPARISON:  08/24/2021 FINDINGS: LOWER CHEST: Left basilar consolidation. HEPATOBILIARY: Normal hepatic contours. No intra- or extrahepatic biliary dilatation. The gallbladder is normal. PANCREAS: Normal pancreas. No ductal dilatation or peripancreatic fluid collection. SPLEEN: Normal. ADRENALS/URINARY TRACT: The adrenal glands are normal. The right kidney is atrophic. There is moderate right hydroureteronephrosis. The urinary bladder is normal for degree of distention STOMACH/BOWEL: There is no hiatal hernia. Normal duodenal course and caliber. No small bowel dilatation or  inflammation. No focal colonic abnormality. Normal appendix. VASCULAR/LYMPHATIC: There is calcific aortic atherosclerosis. There is an infrarenal IVC filter. REPRODUCTIVE: Normal prostate size with symmetric seminal vesicles. MUSCULOSKELETAL. No bony spinal canal stenosis or focal osseous abnormality. OTHER: None. IMPRESSION: 1. Moderate right hydroureteronephrosis without obstructing stone or mass. 2. Atrophic right kidney. 3. Left basilar consolidation, concerning for pneumonia. Aortic Atherosclerosis (ICD10-I70.0). Electronically Signed   By: Ulyses Jarred M.D.   On: 09/17/2021 01:39   VAS [place] LOWER EXTREMITY VENOUS (DVT)  Result Date: 09/17/2021  Lower Venous DVT Study Patient Name:  LENZY KERSCHNER  Date of Exam:   09/17/2021 Medical Rec #: [id number]        Accession #:    [id number] Date of Birth: 1951/05/06        Patient Gender: M Patient Age:   4 years Exam Location:  [facility] Procedure:      VAS [place] LOWER EXTREMITY VENOUS (DVT) Referring Phys: Gean Birchwood --------------------------------------------------------------------------------  Indications: Ulceration, Pain, Edema, and Erythema.  Limitations: Body habitus and poor ultrasound/tissue interface. Comparison Study: No previous exams Performing Technologist: [facility]  RVT, RDMS  Examination Guidelines: A complete evaluation includes B-mode imaging, spectral Doppler, color Doppler, and power Doppler as needed of all accessible portions of each vessel. Bilateral testing is considered an integral part of a complete examination. Limited examinations for reoccurring indications may be performed as noted. The reflux portion of the exam is performed with the patient in reverse Trendelenburg.  +-----+---------------+---------+-----------+----------+--------------+  RIGHT Compressibility Phasicity Spontaneity Properties Thrombus Aging  +-----+---------------+---------+-----------+----------+--------------+  CFV   Full            Yes       Yes                                     +-----+---------------+---------+-----------+----------+--------------+   +---------+---------------+---------+-----------+----------+-------------------+  LEFT      Compressibility Phasicity Spontaneity Properties Thrombus Aging       +---------+---------------+---------+-----------+----------+-------------------+  CFV       Full            Yes       Yes                                         +---------+---------------+---------+-----------+----------+-------------------+  SFJ       Full                                                                  +---------+---------------+---------+-----------+----------+-------------------+  FV Prox   Full            Yes       Yes                                         +---------+---------------+---------+-----------+----------+-------------------+  FV Mid    Partial         No        No                     Age Indeterminate    +---------+---------------+---------+-----------+----------+-------------------+  FV Distal Partial         Yes       Yes                    Age Indeterminate    +---------+---------------+---------+-----------+----------+-------------------+  PFV       Full                                                                  +---------+---------------+---------+-----------+----------+-------------------+  POP       Full            Yes       Yes                                         +---------+---------------+---------+-----------+----------+-------------------+  PTV                       Yes       Yes                    Not well visualized  +---------+---------------+---------+-----------+----------+-------------------+  PERO                      Yes       Yes                    Not well visualized  +---------+---------------+---------+-----------+----------+-------------------+ Only one of paired veins seen in both posterior tibial & peroneal veins. Patent by color and doppler.    Summary: RIGHT: - No evidence of  common femoral vein obstruction.  LEFT: - Findings consistent with age indeterminate deep vein thrombosis involving the left femoral vein. - There is no evidence of superficial venous thrombosis.  - No cystic structure found in the popliteal fossa. Subcutaneous edema noted throughout.  *See table(s) above for measurements and observations. Electronically signed by Harold Barban MD on 09/17/2021 at 7:01:35 PM.    Final     Microbiology: Results for orders placed or performed during the hospital encounter of 10/07/21  Blood culture (routine x 2)     Status: None   Collection Time: 10/07/21  1:33 PM   Specimen: BLOOD RIGHT FOREARM  Result Value Ref Range Status   Specimen Description   Final    BLOOD RIGHT FOREARM Performed at [facility], [address] [place], [place] [address]    Special Requests   Final    BOTTLES DRAWN AEROBIC ONLY Blood Culture results may not be optimal due to an inadequate volume of blood received in culture bottles Performed at [facility] [address], [place], [place] [address]    Culture   Final    NO GROWTH 5 DAYS Performed at [facility], [place] [address] [place], [place] [address]    Report Status 10/12/2021 FINAL  Final  Blood culture (routine x 2)     Status: None   Collection Time: 10/07/21  1:38 PM   Specimen: BLOOD LEFT FOREARM  Result Value Ref Range Status   Specimen Description   Final    BLOOD LEFT FOREARM Performed at [facility], [place] [address] [place], [place] [address]    Special Requests   Final    BOTTLES DRAWN AEROBIC ONLY Blood Culture results may not be optimal due to an inadequate volume of blood received in culture bottles Performed at [facility] [address], [place], [place] [address]    Culture   Final    NO GROWTH 5 DAYS Performed at [facility], [place] [address] [place],  [address]    Report Status 10/12/2021 FINAL  Final  Resp Panel by RT-PCR  (Flu A&B, Covid) Nasopharyngeal Swab     Status: None   Collection Time: 10/07/21  3:03 PM   Specimen: Nasopharyngeal Swab; Nasopharyngeal(NP) swabs in vial transport medium  Result Value Ref Range Status   SARS Coronavirus 2 by RT PCR NEGATIVE NEGATIVE Final    Comment: (NOTE) SARS-CoV-2 target nucleic acids are NOT DETECTED.  The SARS-CoV-2 RNA is generally detectable in upper respiratory specimens during the acute phase of infection. The lowest concentration of SARS-CoV-2 viral copies this assay can detect is 138 copies/mL. A negative result  does not preclude SARS-Cov-2 infection and should not be used as the sole basis for treatment or other patient management decisions. A negative result may occur with  improper specimen collection/handling, submission of specimen other than nasopharyngeal swab, presence of viral mutation(s) within the areas targeted by this assay, and inadequate number of viral copies(<138 copies/mL). A negative result must be combined with clinical observations, patient history, and epidemiological information. The expected result is Negative.  Fact Sheet for Patients:  EntrepreneurPulse.com.au  Fact Sheet for Healthcare Providers:  IncredibleEmployment.be  This test is no t yet approved or cleared by the [place] FDA and  has been authorized for detection and/or diagnosis of SARS-CoV-2 by FDA under an Emergency Use Authorization (EUA). This EUA will remain  in effect (meaning this test can be used) for the duration of the COVID-19 declaration under Section 564(b)(1) of the Act, 21 U.S.C.section 360bbb-3(b)(1), unless the authorization is terminated  or revoked sooner.       Influenza A by PCR NEGATIVE NEGATIVE Final   Influenza B by PCR NEGATIVE NEGATIVE Final    Comment: (NOTE) The Xpert Xpress SARS-CoV-2/FLU/RSV plus assay is intended as an aid in the diagnosis of influenza from Nasopharyngeal swab specimens  and should not be used as a sole basis for treatment. Nasal washings and aspirates are unacceptable for Xpert Xpress SARS-CoV-2/FLU/RSV testing.  Fact Sheet for Patients: EntrepreneurPulse.com.au  Fact Sheet for Healthcare Providers: IncredibleEmployment.be  This test is not yet approved or cleared by the [place] FDA and has been authorized for detection and/or diagnosis of SARS-CoV-2 by FDA under an Emergency Use Authorization (EUA). This EUA will remain in effect (meaning this test can be used) for the duration of the COVID-19 declaration under Section 564(b)(1) of the Act, 21 U.S.C. section 360bbb-3(b)(1), unless the authorization is terminated or revoked.  Performed at [facility], [place] [address] [place],  [address]     Labs: CBC: Recent Labs  Lab 10/08/21 0420 10/09/21 0433  WBC 3.9* 6.8  HGB 8.3* 8.8*  HCT 25.4* 28.4*  MCV 97.3 101.1*  PLT 194 767   Basic Metabolic Panel: Recent Labs  Lab 10/10/21 0359 10/11/21 0434 10/12/21 0401 10/13/21 0417 10/14/21 0354  NA 132* 139 139 138 138  K 4.9 5.1 5.1 5.1 5.4*  CL 103 108 107 108 108  CO2 22 24 23 24 25   GLUCOSE 152* 125* 147* 120* 160*  BUN 66* 66* 65* 62* 55*  CREATININE 3.28* 3.44* 3.05* 2.75* 2.88*  CALCIUM 7.3* 7.9* 8.1* 8.2* 7.8*   Liver Function Tests: Recent Labs  Lab 10/08/21 0420  AST 23  ALT 10  ALKPHOS 75  BILITOT 0.4  PROT 7.2  ALBUMIN 2.9*   CBG: Recent Labs  Lab 10/13/21 1957 10/14/21 0004 10/14/21 0459 10/14/21 0726 10/14/21 1137  GLUCAP 172* 171* 149* 131* 125*    Discharge time spent: greater than 30 minutes.  Signed:  Edwin Dada MD.  [facility] 10/14/2021

## 2021-10-14 NOTE — Plan of Care (Signed)
°  Problem: Clinical Measurements: Goal: Ability to maintain clinical measurements within normal limits will improve Outcome: Progressing   Problem: Nutrition: Goal: Adequate nutrition will be maintained Outcome: Progressing   Problem: Elimination: Goal: Will not experience complications related to bowel motility Outcome: Progressing   Problem: Safety: Goal: Ability to remain free from injury will improve Outcome: Progressing

## 2021-10-14 NOTE — Plan of Care (Signed)
  Problem: Education: Goal: Knowledge of General Education information will improve Description: Including pain rating scale, medication(s)/side effects and non-pharmacologic comfort measures Outcome: Progressing   Problem: Health Behavior/Discharge Planning: Goal: Ability to manage health-related needs will improve Outcome: Progressing   Problem: Nutrition: Goal: Adequate nutrition will be maintained Outcome: Progressing   

## 2021-10-14 NOTE — Progress Notes (Signed)
Physical Therapy Treatment Patient Details Name: EDIS HUISH MRN: [id number] DOB: 10/27/1950 Today's Date: 10/14/2021   History of Present Illness 70 y.o. male with chronic pain syndrome, stage IV CKD, type II DM, gout, hx PE and LLE PVT, HTN, and sCHF discharged from the hospital 6 days PTA (for AKI in the setting of right kidney hydronephrosis, acute respiratory failure with hypoxia in the setting of left lower lobe pneumonia, hypertensive emergency) who was found at home by home health RN confused.  Pt admitted 10/07/21 with hypoglycemia.    PT Comments    Pt received in recliner, still lethargic but more easily awakened/alerted today. Needed ModA to get his prosthetic to click into place- possibly due to R LE edema?-, then [place] and multiple attempts to get to standing from a low chair. MinAx2 for static balance while pulling pants up(RN assisted), then MinAx1 to pivot into WC. Educated RN that easiest way to get him standing outside may be to pull from sturdy area of car door frame since [facility] seat was so low for him. Left in WC in care of RN, all needs otherwise met.    Recommendations for follow up therapy are one component of a multi-disciplinary discharge planning process, led by the attending physician.  Recommendations may be updated based on patient status, additional functional criteria and insurance authorization.  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) , Lt foot wounds Restrictions Weight Bearing Restrictions: No Other Position/Activity Restrictions: prosthesis and shoe in room     Mobility  Bed Mobility               General bed mobility comments: up in recliner     Transfers Overall transfer level: Needs assistance Equipment used: Rolling walker (2 wheels) Transfers: Sit to/from Stand;Bed to chair/wheelchair/BSC Sit to Stand: Mod assist Stand pivot transfers: Min assist         General transfer comment: needed ModA to boost up from low chair, cues for hand placement and rocking technique; Able to take pivotal steps into WC with MinA/RW    Ambulation/Gait               General Gait Details: deferred- actively discharging   Stairs             Wheelchair Mobility    Modified Rankin (Stroke Patients Only)       Balance Overall balance assessment: Needs assistance Sitting-balance support: No upper extremity supported;Feet supported Sitting balance-[name]: Good     Standing balance support: Bilateral upper extremity supported;Reliant on assistive device for balance;During functional activity Standing balance-[name]: Poor                              Cognition Arousal/Alertness: Awake/alert Behavior During Therapy: Flat affect Overall Cognitive Status: Within Functional Limits for tasks assessed                                 General Comments: little more lethargic today, took a bit of extra time to get him fully awake and then needed cues for safety- just a bit impulsive  Exercises      General Comments        Pertinent Vitals/Pain Pain Assessment: Faces Faces Pain Scale: Hurts a little bit Pain Location: back Pain Descriptors / Indicators: Discomfort;Aching Pain Intervention(s): Limited activity within patient's tolerance;Monitored during session;Repositioned    Home Living                          Prior Function            PT Goals (current goals can now be found in the care plan section) Acute Rehab PT Goals Patient Stated Goal: stop feeling nauseous PT Goal Formulation: With patient Time For Goal Achievement: 10/24/21 Potential to Achieve  Goals: Fair Progress towards PT goals: Progressing toward goals    Frequency    Min 3X/week      PT Plan Current plan remains appropriate    Co-evaluation              AM-PAC PT "6 Clicks" Mobility   Outcome Measure  Help needed turning from your back to your side while in a flat bed without using bedrails?: A Little Help needed moving from lying on your back to sitting on the side of a flat bed without using bedrails?: A Little Help needed moving to and from a bed to a chair (including a wheelchair)?: A Little Help needed standing up from a chair using your arms (e.g., wheelchair or bedside chair)?: A Lot Help needed to walk in hospital room?: A Little Help needed climbing 3-5 steps with a railing? : A Lot 6 Click Score: 16    End of Session Equipment Utilized During Treatment: Gait belt Activity Tolerance: Patient tolerated treatment well Patient left: Other (comment) (in [facility] with RN present and attending) Nurse Communication: Mobility status PT Visit Diagnosis: Unsteadiness on feet (R26.81);Difficulty in walking, not elsewhere classified (R26.2)     Time: 5379-4327 PT Time Calculation (min) (ACUTE ONLY): 9 min  Charges:  $Therapeutic Activity: 8-22 mins                    Windell Norfolk, DPT, PN2   Supplemental Physical Therapist [place]    Pager [phone] Acute Rehab Office [phone]

## 2021-10-14 NOTE — Progress Notes (Signed)
PT Cancellation Note  Patient Details Name: Thomas [name] MRN: [id number] DOB: 24-Oct-1950   Cancelled Treatment:    Reason Eval/Treat Not Completed: Other (comment) sound asleep in recliner, I was able to wake him enough for him to tell me "I can't do anything right now, I'm tired". Will attempt to return if time/schedule allow.   Windell Norfolk, DPT, PN2   Supplemental Physical Therapist [place]    Pager [phone] Acute Rehab Office [phone]

## 2021-10-14 NOTE — Plan of Care (Signed)
°  Problem: Education: Goal: Knowledge of General Education information will improve Description: Including pain rating scale, medication(s)/side effects and non-pharmacologic comfort measures 10/14/2021 1306 by Jerene Pitch, RN Outcome: Adequate for Discharge 10/14/2021 0735 by Jerene Pitch, RN Outcome: Progressing   Problem: Health Behavior/Discharge Planning: Goal: Ability to manage health-related needs will improve 10/14/2021 1306 by Jerene Pitch, RN Outcome: Adequate for Discharge 10/14/2021 0735 by Jerene Pitch, RN Outcome: Progressing   Problem: Clinical Measurements: Goal: Ability to maintain clinical measurements within normal limits will improve Outcome: Adequate for Discharge Goal: Will remain free from infection Outcome: Adequate for Discharge Goal: Diagnostic test results will improve Outcome: Adequate for Discharge Goal: Respiratory complications will improve Outcome: Adequate for Discharge Goal: Cardiovascular complication will be avoided Outcome: Adequate for Discharge   Problem: Activity: Goal: Risk for activity intolerance will decrease Outcome: Adequate for Discharge   Problem: Nutrition: Goal: Adequate nutrition will be maintained 10/14/2021 1306 by Jerene Pitch, RN Outcome: Adequate for Discharge 10/14/2021 0735 by Jerene Pitch, RN Outcome: Progressing   Problem: Coping: Goal: Level of anxiety will decrease Outcome: Adequate for Discharge   Problem: Elimination: Goal: Will not experience complications related to bowel motility Outcome: Adequate for Discharge Goal: Will not experience complications related to urinary retention Outcome: Adequate for Discharge   Problem: Pain Managment: Goal: General experience of comfort will improve Outcome: Adequate for Discharge   Problem: Safety: Goal: Ability to remain free from injury will improve Outcome: Adequate for Discharge   Problem: Skin Integrity: Goal: Risk for impaired  skin integrity will decrease Outcome: Adequate for Discharge

## 2021-10-14 NOTE — Progress Notes (Signed)
Provided and discussed discharge instructions. Addressed all questions and concerns. IV removed intact.  ?Rendi Mapel N Saksham Akkerman ? ?

## 2021-10-14 NOTE — TOC Transition Note (Addendum)
Transition of Care [facility]) - CM/SW Discharge Note   Patient Details  Name: Thomas [name] MRN: [id number] Date of Birth: May 11, 1951  [facility] CM/SW Contact:  Ross Ludwig, LCSW Phone Number: 10/14/2021, 11:10 AM   Clinical Narrative:    CSW spoke to patient and confirmed that he wanted to continue with the current home health agency.  Patient is currently open to [facility] and would like to continue with them.  He will be going home with home health PT, OT, RN, aide, and soc work.  CSW signing off please reconsult with any other social work needs, home health agency has been notified of planned discharge.    Final next level of care: [place] Barriers to Discharge: Barriers Resolved   Patient Goals and CMS Choice Patient states their goals for this hospitalization and ongoing recovery are:: To return home with home health. CMS Medicare.gov Compare Post Acute Care list provided to:: Patient Choice offered to / list presented to : Patient  Discharge Placement    Patient discharging back home with home health.                   Discharge Plan and [place] with home health                        HH Arranged: PT, RN, OT, Nurse's Aide, Social Work [facility] Agency: Real Date [place]: 10/14/21 Time [place]: 1109 Representative spoke with at [place]: [place] ([place]) Interventions     Readmission Risk Interventions Readmission Risk Prevention Plan 09/20/2021 09/17/2019  Transportation Screening Complete Complete  PCP or Specialist Appt within 5-7 Days - Not Complete  Not Complete comments - snf vs home  PCP or Specialist Appt within 3-5 Days Complete -  Home Care Screening - Complete  Medication Review (RN CM) - Complete  HRI or Home Care Consult Complete -  Social Work Consult for [place] Planning/Counseling Complete -  Palliative Care Screening Not  Applicable -  Medication Review Press photographer) Complete -  Some recent data might be hidden

## 2021-10-17 ENCOUNTER — Inpatient Hospital Stay (HOSPITAL_COMMUNITY)
Admission: EM | Admit: 2021-10-17 | Discharge: 2021-10-22 | DRG: 917 | Disposition: A | Discharge status: Home Health | Payer: Medicare Other | Attending: Internal Medicine | Admitting: Internal Medicine

## 2021-10-17 ENCOUNTER — Encounter (HOSPITAL_COMMUNITY): Payer: Self-pay

## 2021-10-17 DIAGNOSIS — Z8249 Family history of ischemic heart disease and other diseases of the circulatory system: Secondary | ICD-10-CM

## 2021-10-17 DIAGNOSIS — T383X1A Poisoning by insulin and oral hypoglycemic [antidiabetic] drugs, accidental (unintentional), initial encounter: Principal | ICD-10-CM | POA: Diagnosis present

## 2021-10-17 DIAGNOSIS — M109 Gout, unspecified: Secondary | ICD-10-CM | POA: Diagnosis present

## 2021-10-17 DIAGNOSIS — G9341 Metabolic encephalopathy: Secondary | ICD-10-CM | POA: Diagnosis present

## 2021-10-17 DIAGNOSIS — N179 Acute kidney failure, unspecified: Secondary | ICD-10-CM | POA: Diagnosis present

## 2021-10-17 DIAGNOSIS — E1122 Type 2 diabetes mellitus with diabetic chronic kidney disease: Secondary | ICD-10-CM | POA: Diagnosis present

## 2021-10-17 DIAGNOSIS — I130 Hypertensive heart and chronic kidney disease with heart failure and stage 1 through stage 4 chronic kidney disease, or unspecified chronic kidney disease: Secondary | ICD-10-CM | POA: Diagnosis present

## 2021-10-17 DIAGNOSIS — E875 Hyperkalemia: Secondary | ICD-10-CM | POA: Diagnosis present

## 2021-10-17 DIAGNOSIS — Z86718 Personal history of other venous thrombosis and embolism: Secondary | ICD-10-CM

## 2021-10-17 DIAGNOSIS — Z809 Family history of malignant neoplasm, unspecified: Secondary | ICD-10-CM

## 2021-10-17 DIAGNOSIS — Z7901 Long term (current) use of anticoagulants: Secondary | ICD-10-CM

## 2021-10-17 DIAGNOSIS — Z79899 Other long term (current) drug therapy: Secondary | ICD-10-CM

## 2021-10-17 DIAGNOSIS — Z89511 Acquired absence of right leg below knee: Secondary | ICD-10-CM

## 2021-10-17 DIAGNOSIS — Z79890 Hormone replacement therapy (postmenopausal): Secondary | ICD-10-CM

## 2021-10-17 DIAGNOSIS — D631 Anemia in chronic kidney disease: Secondary | ICD-10-CM | POA: Diagnosis present

## 2021-10-17 DIAGNOSIS — I5042 Chronic combined systolic (congestive) and diastolic (congestive) heart failure: Secondary | ICD-10-CM | POA: Diagnosis present

## 2021-10-17 DIAGNOSIS — Z86711 Personal history of pulmonary embolism: Secondary | ICD-10-CM

## 2021-10-17 DIAGNOSIS — D539 Nutritional anemia, unspecified: Secondary | ICD-10-CM | POA: Diagnosis present

## 2021-10-17 DIAGNOSIS — E162 Hypoglycemia, unspecified: Secondary | ICD-10-CM | POA: Diagnosis not present

## 2021-10-17 DIAGNOSIS — E11649 Type 2 diabetes mellitus with hypoglycemia without coma: Secondary | ICD-10-CM | POA: Diagnosis present

## 2021-10-17 DIAGNOSIS — L89321 Pressure ulcer of left buttock, stage 1: Secondary | ICD-10-CM | POA: Diagnosis present

## 2021-10-17 DIAGNOSIS — Z95828 Presence of other vascular implants and grafts: Secondary | ICD-10-CM

## 2021-10-17 DIAGNOSIS — Z20822 Contact with and (suspected) exposure to covid-19: Secondary | ICD-10-CM | POA: Diagnosis present

## 2021-10-17 DIAGNOSIS — G894 Chronic pain syndrome: Secondary | ICD-10-CM | POA: Diagnosis present

## 2021-10-17 DIAGNOSIS — K219 Gastro-esophageal reflux disease without esophagitis: Secondary | ICD-10-CM | POA: Diagnosis present

## 2021-10-17 DIAGNOSIS — E039 Hypothyroidism, unspecified: Secondary | ICD-10-CM | POA: Diagnosis present

## 2021-10-17 DIAGNOSIS — N184 Chronic kidney disease, stage 4 (severe): Secondary | ICD-10-CM | POA: Diagnosis present

## 2021-10-17 DIAGNOSIS — N400 Enlarged prostate without lower urinary tract symptoms: Secondary | ICD-10-CM | POA: Diagnosis present

## 2021-10-17 DIAGNOSIS — Z981 Arthrodesis status: Secondary | ICD-10-CM

## 2021-10-17 LAB — CBG MONITORING, ED: Glucose-Capillary: 36 mg/dL — CL (ref 70–99)

## 2021-10-17 MED ORDER — DEXTROSE 50 % IV SOLN
1.0000 | Freq: Once | INTRAVENOUS | Status: AC
  Administered 2021-10-18: 01:00:00 50 mL via INTRAVENOUS
  Filled 2021-10-17: qty 50

## 2021-10-17 NOTE — ED Triage Notes (Signed)
Patient BIB EMS from home with complaint of hypoglycemia. Upon EMS arrival, CBG 67. Pt given orange juice and peanut butter sand which, CBG improved to 73. Pt endorses weakness, states he wants to be checked out.

## 2021-10-18 ENCOUNTER — Emergency Department (HOSPITAL_COMMUNITY): Payer: Medicare Other

## 2021-10-18 ENCOUNTER — Other Ambulatory Visit: Payer: Self-pay

## 2021-10-18 DIAGNOSIS — D539 Nutritional anemia, unspecified: Secondary | ICD-10-CM | POA: Diagnosis present

## 2021-10-18 DIAGNOSIS — Z86711 Personal history of pulmonary embolism: Secondary | ICD-10-CM | POA: Diagnosis not present

## 2021-10-18 DIAGNOSIS — E162 Hypoglycemia, unspecified: Secondary | ICD-10-CM

## 2021-10-18 DIAGNOSIS — Z86718 Personal history of other venous thrombosis and embolism: Secondary | ICD-10-CM | POA: Diagnosis not present

## 2021-10-18 DIAGNOSIS — E039 Hypothyroidism, unspecified: Secondary | ICD-10-CM | POA: Diagnosis present

## 2021-10-18 DIAGNOSIS — E1122 Type 2 diabetes mellitus with diabetic chronic kidney disease: Secondary | ICD-10-CM | POA: Diagnosis present

## 2021-10-18 DIAGNOSIS — T383X1A Poisoning by insulin and oral hypoglycemic [antidiabetic] drugs, accidental (unintentional), initial encounter: Secondary | ICD-10-CM | POA: Diagnosis present

## 2021-10-18 DIAGNOSIS — N400 Enlarged prostate without lower urinary tract symptoms: Secondary | ICD-10-CM | POA: Diagnosis present

## 2021-10-18 DIAGNOSIS — L89321 Pressure ulcer of left buttock, stage 1: Secondary | ICD-10-CM | POA: Diagnosis present

## 2021-10-18 DIAGNOSIS — G894 Chronic pain syndrome: Secondary | ICD-10-CM | POA: Diagnosis present

## 2021-10-18 DIAGNOSIS — I130 Hypertensive heart and chronic kidney disease with heart failure and stage 1 through stage 4 chronic kidney disease, or unspecified chronic kidney disease: Secondary | ICD-10-CM | POA: Diagnosis present

## 2021-10-18 DIAGNOSIS — Z8249 Family history of ischemic heart disease and other diseases of the circulatory system: Secondary | ICD-10-CM | POA: Diagnosis not present

## 2021-10-18 DIAGNOSIS — N179 Acute kidney failure, unspecified: Secondary | ICD-10-CM | POA: Diagnosis present

## 2021-10-18 DIAGNOSIS — Z20822 Contact with and (suspected) exposure to covid-19: Secondary | ICD-10-CM | POA: Diagnosis present

## 2021-10-18 DIAGNOSIS — I5042 Chronic combined systolic (congestive) and diastolic (congestive) heart failure: Secondary | ICD-10-CM | POA: Diagnosis present

## 2021-10-18 DIAGNOSIS — Z89511 Acquired absence of right leg below knee: Secondary | ICD-10-CM | POA: Diagnosis not present

## 2021-10-18 DIAGNOSIS — K219 Gastro-esophageal reflux disease without esophagitis: Secondary | ICD-10-CM | POA: Diagnosis present

## 2021-10-18 DIAGNOSIS — D631 Anemia in chronic kidney disease: Secondary | ICD-10-CM | POA: Diagnosis present

## 2021-10-18 DIAGNOSIS — N184 Chronic kidney disease, stage 4 (severe): Secondary | ICD-10-CM | POA: Diagnosis present

## 2021-10-18 DIAGNOSIS — Z809 Family history of malignant neoplasm, unspecified: Secondary | ICD-10-CM | POA: Diagnosis not present

## 2021-10-18 DIAGNOSIS — E11649 Type 2 diabetes mellitus with hypoglycemia without coma: Secondary | ICD-10-CM | POA: Diagnosis present

## 2021-10-18 DIAGNOSIS — Z7901 Long term (current) use of anticoagulants: Secondary | ICD-10-CM | POA: Diagnosis not present

## 2021-10-18 DIAGNOSIS — M109 Gout, unspecified: Secondary | ICD-10-CM | POA: Diagnosis present

## 2021-10-18 DIAGNOSIS — Z95828 Presence of other vascular implants and grafts: Secondary | ICD-10-CM | POA: Diagnosis not present

## 2021-10-18 DIAGNOSIS — G9341 Metabolic encephalopathy: Secondary | ICD-10-CM | POA: Diagnosis present

## 2021-10-18 LAB — RESP PANEL BY RT-PCR (FLU A&B, COVID) ARPGX2
Influenza A by PCR: NEGATIVE
Influenza B by PCR: NEGATIVE
SARS Coronavirus 2 by RT PCR: NEGATIVE

## 2021-10-18 LAB — CBC
HCT: 28.2 % — ABNORMAL LOW (ref 39.0–52.0)
HCT: 24.5 % — ABNORMAL LOW (ref 39.0–52.0)
Hemoglobin: 8.8 g/dL — ABNORMAL LOW (ref 13.0–17.0)
Hemoglobin: 7.7 g/dL — ABNORMAL LOW (ref 13.0–17.0)
MCH: 32 pg (ref 26.0–34.0)
MCH: 31.7 pg (ref 26.0–34.0)
MCHC: 31.2 g/dL (ref 30.0–36.0)
MCHC: 31.4 g/dL (ref 30.0–36.0)
MCV: 102.5 fL — ABNORMAL HIGH (ref 80.0–100.0)
MCV: 100.8 fL — ABNORMAL HIGH (ref 80.0–100.0)
Platelets: 148 10*3/uL — ABNORMAL LOW (ref 150–400)
Platelets: 118 10*3/uL — ABNORMAL LOW (ref 150–400)
RBC: 2.75 MIL/uL — ABNORMAL LOW (ref 4.22–5.81)
RBC: 2.43 MIL/uL — ABNORMAL LOW (ref 4.22–5.81)
RDW: 16.1 % — ABNORMAL HIGH (ref 11.5–15.5)
RDW: 15.7 % — ABNORMAL HIGH (ref 11.5–15.5)
WBC: 8 10*3/uL (ref 4.0–10.5)
WBC: 5.8 10*3/uL (ref 4.0–10.5)
nRBC: 0 % (ref 0.0–0.2)
nRBC: 0 % (ref 0.0–0.2)

## 2021-10-18 LAB — COMPREHENSIVE METABOLIC PANEL
ALT: 9 U/L (ref 0–44)
ALT: 9 U/L (ref 0–44)
AST: 32 U/L (ref 15–41)
AST: 22 U/L (ref 15–41)
Albumin: 3.5 g/dL (ref 3.5–5.0)
Albumin: 2.9 g/dL — ABNORMAL LOW (ref 3.5–5.0)
Alkaline Phosphatase: 75 U/L (ref 38–126)
Alkaline Phosphatase: 68 U/L (ref 38–126)
Anion gap: 13 (ref 5–15)
Anion gap: 7 (ref 5–15)
BUN: 65 mg/dL — ABNORMAL HIGH (ref 8–23)
BUN: 64 mg/dL — ABNORMAL HIGH (ref 8–23)
CO2: 20 mmol/L — ABNORMAL LOW (ref 22–32)
CO2: 24 mmol/L (ref 22–32)
Calcium: 7.2 mg/dL — ABNORMAL LOW (ref 8.9–10.3)
Calcium: 6.8 mg/dL — ABNORMAL LOW (ref 8.9–10.3)
Chloride: 103 mmol/L (ref 98–111)
Chloride: 103 mmol/L (ref 98–111)
Creatinine, Ser: 4.43 mg/dL — ABNORMAL HIGH (ref 0.61–1.24)
Creatinine, Ser: 4.33 mg/dL — ABNORMAL HIGH (ref 0.61–1.24)
GFR, Estimated: 14 mL/min — ABNORMAL LOW (ref >60)
GFR, Estimated: 14 mL/min — ABNORMAL LOW (ref >60)
Glucose, Bld: 44 mg/dL — CL (ref 70–99)
Glucose, Bld: 101 mg/dL — ABNORMAL HIGH (ref 70–99)
Potassium: 5.6 mmol/L — ABNORMAL HIGH (ref 3.5–5.1)
Potassium: 4.6 mmol/L (ref 3.5–5.1)
Sodium: 136 mmol/L (ref 135–145)
Sodium: 134 mmol/L — ABNORMAL LOW (ref 135–145)
Total Bilirubin: 0.9 mg/dL (ref 0.3–1.2)
Total Bilirubin: 0.6 mg/dL (ref 0.3–1.2)
Total Protein: 8.1 g/dL (ref 6.5–8.1)
Total Protein: 7.2 g/dL (ref 6.5–8.1)

## 2021-10-18 LAB — MRSA NEXT GEN BY PCR, NASAL: MRSA by PCR Next Gen: NOT DETECTED

## 2021-10-18 LAB — URINALYSIS, ROUTINE W REFLEX MICROSCOPIC
Bacteria, UA: NONE SEEN
Bilirubin Urine: NEGATIVE
Glucose, UA: NEGATIVE mg/dL
Ketones, ur: NEGATIVE mg/dL
Nitrite: NEGATIVE
Protein, ur: 100 mg/dL — AB
Specific Gravity, Urine: 1.01 (ref 1.005–1.030)
WBC, UA: >50 WBC/hpf — ABNORMAL HIGH (ref 0–5)
pH: 5 (ref 5.0–8.0)

## 2021-10-18 LAB — GLUCOSE, CAPILLARY
Glucose-Capillary: 142 mg/dL — ABNORMAL HIGH (ref 70–99)
Glucose-Capillary: 138 mg/dL — ABNORMAL HIGH (ref 70–99)
Glucose-Capillary: 93 mg/dL (ref 70–99)

## 2021-10-18 LAB — CBG MONITORING, ED
Glucose-Capillary: 100 mg/dL — ABNORMAL HIGH (ref 70–99)
Glucose-Capillary: 144 mg/dL — ABNORMAL HIGH (ref 70–99)
Glucose-Capillary: 42 mg/dL — CL (ref 70–99)
Glucose-Capillary: 51 mg/dL — ABNORMAL LOW (ref 70–99)
Glucose-Capillary: 84 mg/dL (ref 70–99)
Glucose-Capillary: 88 mg/dL (ref 70–99)
Glucose-Capillary: 98 mg/dL (ref 70–99)

## 2021-10-18 LAB — MAGNESIUM: Magnesium: 1.9 mg/dL (ref 1.7–2.4)

## 2021-10-18 LAB — LACTIC ACID, PLASMA
Lactic Acid, Venous: 1.7 mmol/L (ref 0.5–1.9)
Lactic Acid, Venous: 0.9 mmol/L (ref 0.5–1.9)

## 2021-10-18 IMAGING — CR DG CHEST 2V
2 series · 2 of 2 positions shown · non-contrast
Comparison: [DATE]

CLINICAL DATA: Hypoglycemia

EXAM:
CHEST - 2 VIEW

[w chest lat]
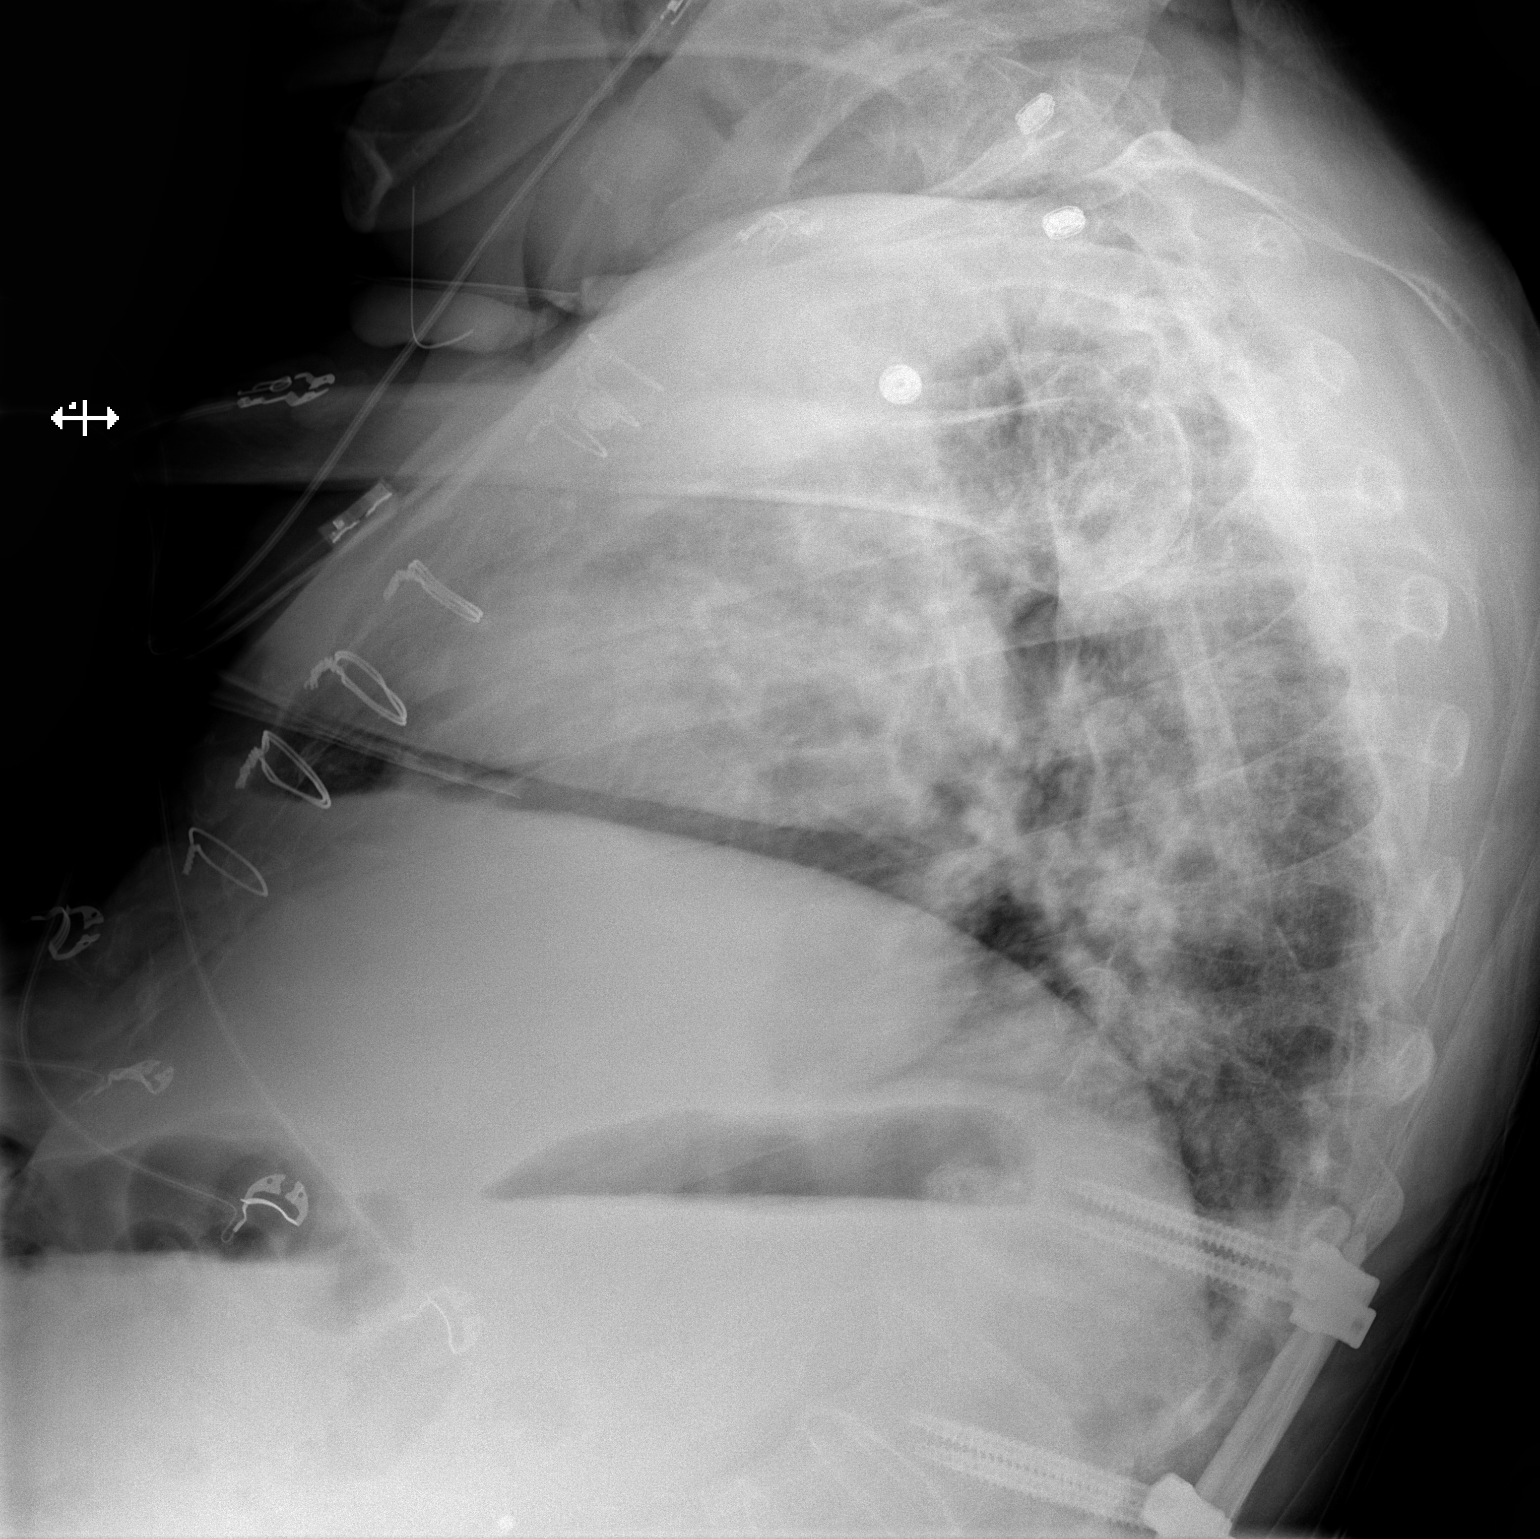

[x chest ap]
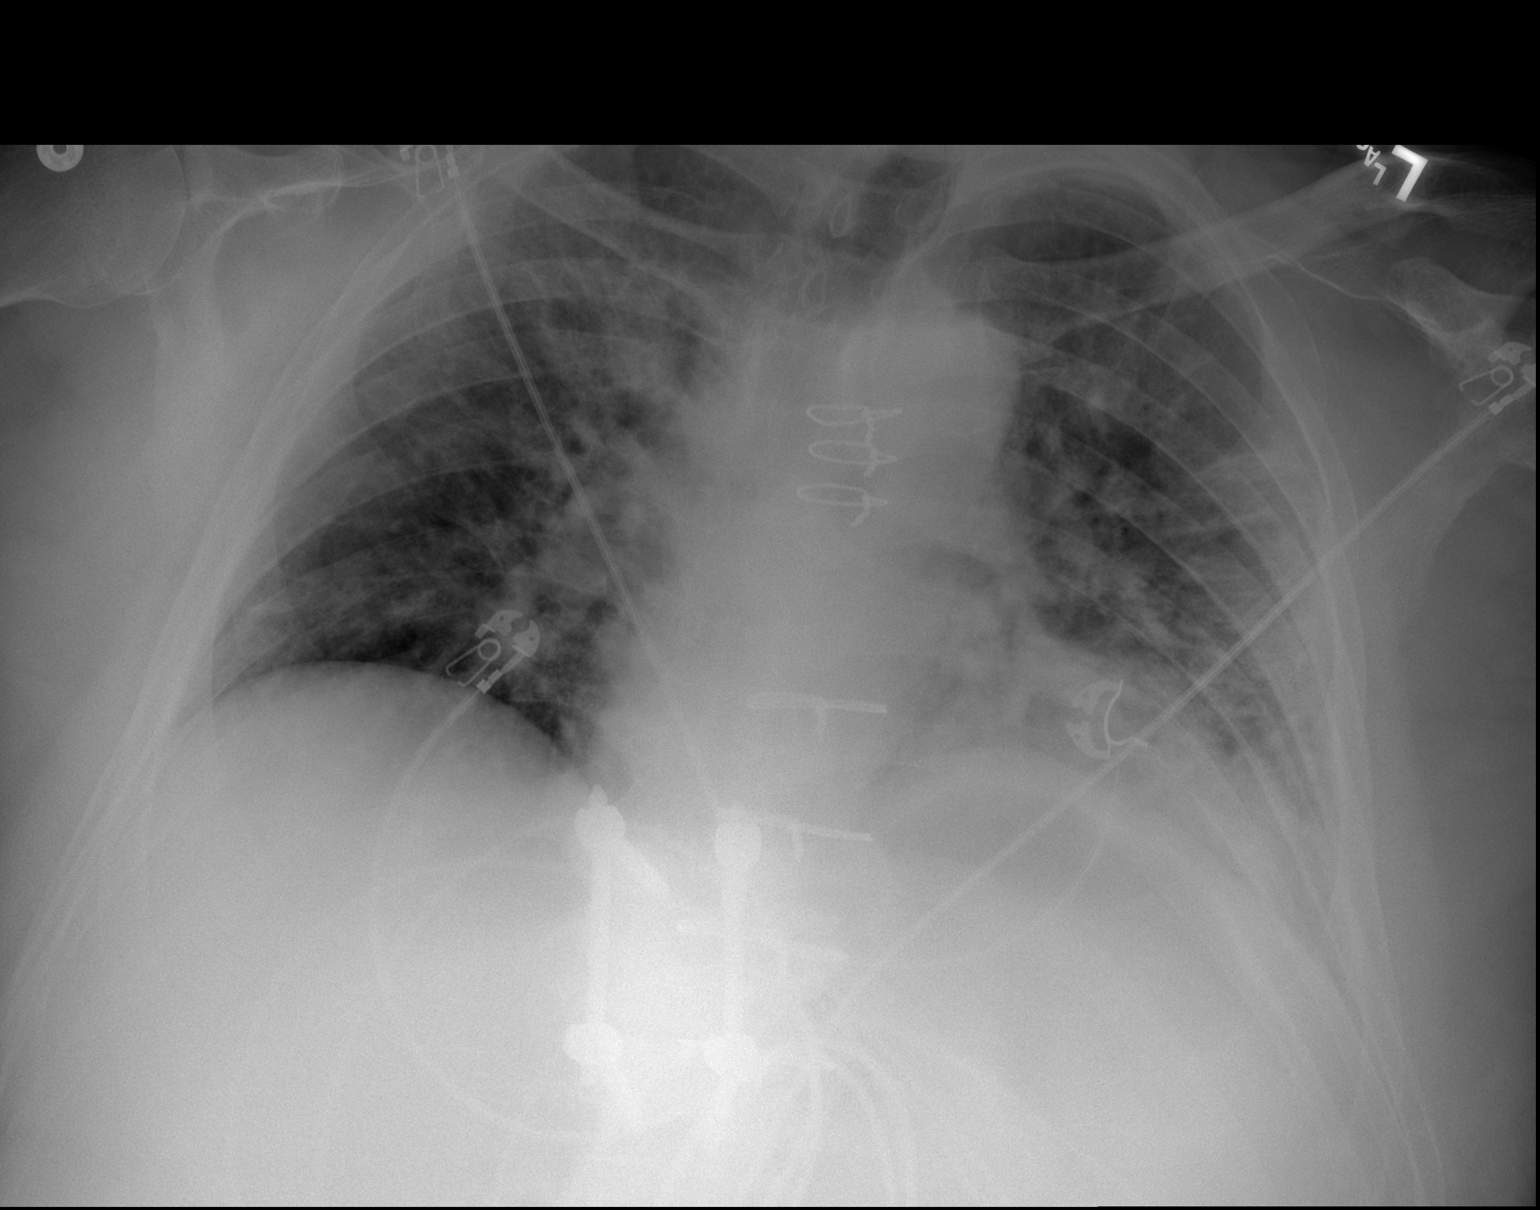

[2 of 2 positions shown; findings below may reference images not displayed]

FINDINGS: Persistent bilateral interstitial pulmonary edema, unchanged. Mild
cardiomegaly. No pleural effusion.
IMPRESSION: Unchanged cardiomegaly and interstitial pulmonary edema.

## 2021-10-18 MED ORDER — DEXTROSE 50 % IV SOLN
1.0000 | INTRAVENOUS | Status: DC | PRN
  Administered 2021-10-18: 04:00:00 50 mL via INTRAVENOUS
  Filled 2021-10-18: qty 50

## 2021-10-18 MED ORDER — HYDROMORPHONE HCL 1 MG/ML IJ SOLN
0.5000 mg | INTRAMUSCULAR | Status: DC | PRN
  Administered 2021-10-18 – 2021-10-19 (×10): 0.5 mg via INTRAVENOUS
  Filled 2021-10-18 (×10): qty 1

## 2021-10-18 MED ORDER — ACETAMINOPHEN 325 MG PO TABS
650.0000 mg | ORAL_TABLET | Freq: Four times a day (QID) | ORAL | Status: DC | PRN
  Administered 2021-10-19 – 2021-10-20 (×2): 650 mg via ORAL
  Filled 2021-10-18 (×2): qty 2

## 2021-10-18 MED ORDER — ORAL CARE MOUTH RINSE
15.0000 mL | Freq: Two times a day (BID) | OROMUCOSAL | Status: DC
  Administered 2021-10-18 – 2021-10-22 (×7): 15 mL via OROMUCOSAL

## 2021-10-18 MED ORDER — ACETAMINOPHEN 650 MG RE SUPP: 650.0000 mg | Freq: Four times a day (QID) | RECTAL | Status: DC | PRN

## 2021-10-18 MED ORDER — APIXABAN 5 MG PO TABS
5.0000 mg | ORAL_TABLET | Freq: Two times a day (BID) | ORAL | Status: DC
  Administered 2021-10-18 – 2021-10-22 (×8): 5 mg via ORAL
  Filled 2021-10-18 (×8): qty 1

## 2021-10-18 MED ORDER — DEXTROSE 50 % IV SOLN: 1.0000 | Freq: Once | INTRAVENOUS | Status: DC

## 2021-10-18 MED ORDER — MORPHINE SULFATE (PF) 2 MG/ML IV SOLN
2.0000 mg | INTRAVENOUS | Status: DC | PRN
  Administered 2021-10-18 – 2021-10-19 (×2): 2 mg via INTRAVENOUS
  Filled 2021-10-18 (×2): qty 1

## 2021-10-18 MED ORDER — DEXTROSE 10 % IV SOLN: INTRAVENOUS | Status: DC

## 2021-10-18 MED ORDER — NALOXONE HCL 0.4 MG/ML IJ SOLN: 0.4000 mg | INTRAMUSCULAR | Status: DC | PRN

## 2021-10-18 MED ORDER — SODIUM CHLORIDE 0.9 % IV BOLUS
500.0000 mL | Freq: Once | INTRAVENOUS | Status: AC
  Administered 2021-10-18: 04:00:00 500 mL via INTRAVENOUS

## 2021-10-18 MED ORDER — SODIUM ZIRCONIUM CYCLOSILICATE 10 G PO PACK
10.0000 g | PACK | Freq: Once | ORAL | Status: AC
  Administered 2021-10-18: 03:00:00 10 g via ORAL
  Filled 2021-10-18: qty 1

## 2021-10-18 MED ORDER — CHLORHEXIDINE GLUCONATE CLOTH 2 % EX PADS
6.0000 | MEDICATED_PAD | Freq: Every day | CUTANEOUS | Status: DC
  Administered 2021-10-18 – 2021-10-20 (×3): 6 via TOPICAL

## 2021-10-18 NOTE — Progress Notes (Signed)
[place] admit to the daytime admitter; I discussed the patient's case with the EDP, [place], [name] .  Per these discussions, this is a:  70 year old male with history of type 2 diabetes mellitus who is being admitted for symptomatic hypoglycemia after presenting to [facility] emergency department this evening complaining of hypoglycemia per home glucometer reading.  Initial blood sugars noted to be in the 30s.  Additional labs notable for acute kidney injury on CKD, with presenting serum creatinine 4 relative to most recent prior 2.88, as well as mild hyperkalemia with presenting potassium 5.6.  he reportedly was just recently hospitalized with very similar constellation of pathologies.  At the time of discharge, he was instructed to stop his glipizide, however, patient reportedly continued to take his glipizide following discharge from the hospital.   In the ED this evening, he has been started on D10 drip, with preceding normal saline bolus, as well as received dose of Lokelma.  I have placed order to admit to sdu for further evaluation and management of hypoglycemia, AKI, and hyperkalemia.  I also placed preliminary admission orders via the adult multi morbid order set.  Additionally, I have placed orders for Q2-hour CBG checks, with prn amp of D50 for hypoglycemia (in addition to active order for D10).  I have also place orders for a.m. labs, including CMP to monitor interval serum potassium level.    Babs Bertin, DO Hospitalist

## 2021-10-18 NOTE — H&P (Signed)
History and Physical    ARIF AMENDOLA OIZ:[id number] DOB: 1951-03-25 DOA: 10/17/2021  PCP: Antonietta Jewel, MD  Patient coming from: Home  Chief Complaint: hypoglycemia  HPI: Thomas [name] is a 70 y.o. male with medical history significant of chronic pain, HTN, HFrEF, gout, PE and LLE DVT, DM2, CKD 4. Presenting with hypoglycemia and weakness. Recently discharged on 12/26 after a hospital stay for the same. He reports that he was feeling ok until yesterday. He noticed that he was weak. He checked his glucose and it was 50. This concerned him, so he started eating handfuls of sugar. When he rechecked his glucose, it was lower. Since he felt even weaker, he decided to call EMS. He denies any other aggravating or alleviating factors.  ED Course: He was noted to be hypoglycemic. He was given an amp of D50 but remained hypoglycemic. He was started on D10. TRH was called for admission.   Review of Systems:  Denies CP, palpitations, dyspnea, abdominal pain, N/V/D, fevers. Review of systems is otherwise negative for all not mentioned in HPI.   PMHx Past Medical History:  Diagnosis Date   Arthritis    BPH (benign prostatic hyperplasia) 11/24/2019   Chronic pain syndrome 11/24/2019   CKD (chronic kidney disease), stage IV ([place]) 11/24/2019   Diabetes mellitus without complication (HCC)    Gout    Hypertension    Muscle pain    Physical deconditioning 11/24/2019   Scars    Swelling     PSHx Past Surgical History:  Procedure Laterality Date   BELOW KNEE LEG AMPUTATION Right    ESOPHAGOGASTRODUODENOSCOPY (EGD) WITH PROPOFOL N/A 11/25/2019   Procedure: ESOPHAGOGASTRODUODENOSCOPY (EGD) WITH PROPOFOL;  Surgeon: Carol Ada, MD;  Location: [place];  Service: Endoscopy;  Laterality: N/A;   IR FLUORO GUIDE CV LINE RIGHT  09/05/2019   IR LUMBAR DISC ASPIRATION W/IMG GUIDE  11/26/2019   IR US GUIDE VASC ACCESS RIGHT  09/05/2019   RADIOLOGY WITH ANESTHESIA N/A 11/25/2019   Procedure: Thoracic and Lumbar  MRI WITH ANESTHESIA;  Surgeon: Radiologist, Medication, MD;  Location: [place];  Service: Radiology;  Laterality: N/A;    SocHx  reports that he has never smoked. He has never been exposed to tobacco smoke. He has never used smokeless tobacco. He reports that he does not drink alcohol and does not use drugs.  Allergies  Allergen Reactions   Other Other (See Comments)    Blood pressure issues Per [facility] hospital: Pt states he can only take these pain meds or else he gets very sick: Oxycontin,morphine,demerol, and dilaudid are the only pain meds pt states he can take.     Oxymorphone Other (See Comments)    Causes kidney problems   Aspirin Nausea And Vomiting    Per [facility]   Beta Vulgaris Nausea And Vomiting    Beets   Buspirone Other (See Comments)    Unknown reaction   Cabbage Nausea And Vomiting   Codeine Other (See Comments)    unknown   Dolobid [Diflunisal]     Unknown reaction   Fish Allergy Nausea And Vomiting   Fish-Derived Products Nausea And Vomiting   Methadone     Unknown reaction   Pentazocine Other (See Comments)    Unknown reaction   Propoxyphene Other (See Comments)    Unknown reaction   Shellfish Allergy Nausea And Vomiting   Sulfa Antibiotics Hives   Sulfasalazine Other (See Comments)    Unknown reaction   Vistaril [Hydroxyzine]  Unknown reaction   Amoxicillin Rash    Per [facility] Family History  Problem Relation Age of Onset   Heart disease Mother    Cancer Father     Prior to Admission medications   Medication Sig Start Date End Date Taking? Authorizing Provider  acetaminophen (TYLENOL) 500 MG tablet Take 2 tablets (1,000 mg total) by mouth every 8 (eight) hours. Patient taking differently: Take 1,000 mg by mouth every 8 (eight) hours as needed for moderate pain. 10/03/20   Jeralyn Bennett, MD  allopurinol (ZYLOPRIM) 100 MG tablet Take 100 mg by mouth daily.     [provider]  amLODipine (NORVASC) 5  MG tablet Take 5 tablets by mouth daily.    [provider]  apixaban (ELIQUIS) 5 MG TABS tablet Take 1 tablet by mouth 2 times daily. 10/22/21 01/20/22  Kayleen Memos, DO  atorvastatin (LIPITOR) 80 MG tablet Take 1 tablet (80 mg total) by mouth daily. 09/25/20   Jose Persia, MD  cloNIDine (CATAPRES - DOSED IN MG/24 HR) 0.3 mg/24hr patch Place 1 patch onto the skin once a week. 10/01/21 10/31/21  Kayleen Memos, DO  colchicine 0.6 MG tablet Take 0.6 mg by mouth daily. 08/21/21   [provider]  cyanocobalamin 100 MCG tablet Take 2 tablets by mouth daily. 10/01/21 10/31/21  Kayleen Memos, DO  ferrous sulfate 325 (65 FE) MG tablet Take 1 tablet by mouth daily with breakfast. 10/01/21 10/31/21  Kayleen Memos, DO  finasteride (PROSCAR) 5 MG tablet Take 1 tablet (5 mg total) by mouth daily. 10/10/19   Hosie Poisson, MD  gabapentin (NEURONTIN) 300 MG capsule Take 1 capsule (300 mg total) by mouth at bedtime. Patient taking differently: Take 300 mg by mouth every 8 (eight) hours. 06/06/21   Nita Sells, MD  hydrALAZINE (APRESOLINE) 100 MG tablet Take 1 tablet by mouth every 8 hours. 10/01/21 10/31/21  Kayleen Memos, DO  lactulose (CHRONULAC) 10 GM/15ML solution Take 15 mLs (10 g total) by mouth 2 (two) times daily. 09/24/20   Jose Persia, MD  levothyroxine (SYNTHROID) 50 MCG tablet Take 50 mcg by mouth daily before breakfast.     [provider]  methocarbamol (ROBAXIN) 750 MG tablet Take 1 tablet (750 mg total) by mouth every 8 (eight) hours as needed for muscle spasms. 06/06/21   Nita Sells, MD  metoprolol tartrate (LOPRESSOR) 50 MG tablet Take 1 tablet by mouth 2 times daily. 10/01/21 10/31/21  Kayleen Memos, DO  naloxegol oxalate (MOVANTIK) 12.5 MG TABS tablet Take 1 tablet by mouth daily. 10/01/21 10/31/21  Kayleen Memos, DO  OXYCONTIN 30 MG 12 hr tablet Take 1 tablet (30 mg total) by mouth 2 (two) times daily. 10/14/21   Danford, Suann Larry, MD   pantoprazole (PROTONIX) 40 MG tablet Take 1 tablet by mouth 2 times daily. 10/01/21 10/31/21  Kayleen Memos, DO  polyethylene glycol (MIRALAX) 17 g packet Take 17 g by mouth daily as needed. 10/01/21   Kayleen Memos, DO  saccharomyces boulardii (FLORASTOR) 250 MG capsule Take 1 capsule by mouth 2 times daily. 10/01/21 10/31/21  Kayleen Memos, DO  tamsulosin (FLOMAX) 0.4 MG CAPS capsule Take 1 capsule (0.4 mg total) by mouth daily. 09/25/20   Jose Persia, MD    Physical Exam: Vitals:   10/18/21 0545 10/18/21 0551 10/18/21 0600 10/18/21 0900  BP: 121/72  131/76 136/84  Pulse: 66  (!) 48 67  Resp: 14  17 12  Temp:      TempSrc:      SpO2: 96% 96% 96% 93%    General: 71 y.o. male resting in bed in NAD Eyes: PERRL, normal sclera ENMT: Nares patent w/o discharge, orophaynx clear, dentition normal, ears w/o discharge/lesions/ulcers Neck: Supple, trachea midline Cardiovascular: RRR, +S1, S2, no m/g/r, equal pulses throughout Respiratory: CTABL, no w/r/r, normal WOB GI: BS+, NDNT, no masses noted, no organomegaly noted MSK: No c/c, RBKA, LLE chronic swelling  Neuro: A&O x 3, no focal deficits Psyc: Appropriate interaction and affect, calm/cooperative  Labs on Admission: I have personally reviewed following labs and imaging studies  CBC: Recent Labs  Lab 10/18/21 0037 10/18/21 0607  WBC 8.0 5.8  HGB 8.8* 7.7*  HCT 28.2* 24.5*  MCV 102.5* 100.8*  PLT 148* 161*   Basic Metabolic Panel: Recent Labs  Lab 10/12/21 0401 10/13/21 0417 10/14/21 0354 10/18/21 0037 10/18/21 0607  NA 139 138 138 136 134*  K 5.1 5.1 5.4* 5.6* 4.6  CL 107 108 108 103 103  CO2 23 24 25  20* 24  GLUCOSE 147* 120* 160* 44* 101*  BUN 65* 62* 55* 65* 64*  CREATININE 3.05* 2.75* 2.88* 4.43* 4.33*  CALCIUM 8.1* 8.2* 7.8* 7.2* 6.8*  MG  --   --   --   --  1.9   GFR: Estimated Creatinine Clearance: 24.1 mL/min (A) (by C-G formula based on SCr of 4.33 mg/dL (H)). Liver Function Tests: Recent Labs   Lab 10/18/21 0037 10/18/21 0607  AST 32 22  ALT 9 9  ALKPHOS 75 68  BILITOT 0.9 0.6  PROT 8.1 7.2  ALBUMIN 3.5 2.9*   No results for input(s): LIPASE, AMYLASE in the last 168 hours. No results for input(s): AMMONIA in the last 168 hours. Coagulation Profile: No results for input(s): INR, PROTIME in the last 168 hours. Cardiac Enzymes: No results for input(s): CKTOTAL, CKMB, CKMBINDEX, TROPONINI in the last 168 hours. BNP (last 3 results) No results for input(s): PROBNP in the last 8760 hours. HbA1C: No results for input(s): HGBA1C in the last 72 hours. CBG: Recent Labs  Lab 10/18/21 0001 10/18/21 0153 10/18/21 0340 10/18/21 0600 10/18/21 0837  GLUCAP 51* 144* 42* 98 88   Lipid Profile: No results for input(s): CHOL, HDL, LDLCALC, TRIG, CHOLHDL, LDLDIRECT in the last 72 hours. Thyroid Function Tests: No results for input(s): TSH, T4TOTAL, FREET4, T3FREE, THYROIDAB in the last 72 hours. Anemia Panel: No results for input(s): VITAMINB12, FOLATE, FERRITIN, TIBC, IRON, RETICCTPCT in the last 72 hours. Urine analysis:    Component Value Date/Time   COLORURINE YELLOW 10/18/2021 0037   APPEARANCEUR HAZY (A) 10/18/2021 0037   LABSPEC 1.010 10/18/2021 0037   PHURINE 5.0 10/18/2021 0037   GLUCOSEU NEGATIVE 10/18/2021 0037   HGBUR SMALL (A) 10/18/2021 0037   BILIRUBINUR NEGATIVE 10/18/2021 0037   KETONESUR NEGATIVE 10/18/2021 0037   PROTEINUR 100 (A) 10/18/2021 0037   NITRITE NEGATIVE 10/18/2021 0037   LEUKOCYTESUR LARGE (A) 10/18/2021 0037    Radiological Exams on Admission: DG Chest 2 View  Result Date: 10/18/2021 CLINICAL DATA:  Hypoglycemia EXAM: CHEST - 2 VIEW COMPARISON:  10/07/2021 FINDINGS: Persistent bilateral interstitial pulmonary edema, unchanged. Mild cardiomegaly. No pleural effusion. IMPRESSION: Unchanged cardiomegaly and interstitial pulmonary edema. Electronically Signed   By: Ulyses Jarred M.D.   On: 10/18/2021 00:26    EKG: Independently reviewed.  Sinus, no st elevations  Assessment/Plan Hypoglycemia Acute metabolic encephalopathy secondary to above     - admitted  to inpt, SDU     - continue D10 for now; q2h glucose checks     - at last discharge, he was instructed to stop his sulfonylurea; he was still taking it; counseled again on stopping this medication; will see if TOC can get HH for medication assistance  CKD 4 - 5     - he is at baseline; watch nephrotoxins  Anemia of chronic disease Macrocytic anemia     - baseline is 8 - 9; he's 7.7 today     - follow for now  Hx of Gout     - continue home regimen when confirmed  HTN     - continue home regimen when confirmed  Hx of PE/DVT     - continue eliquis  BPH     - continue home regimen  Hypothyroidism     - continue home regimen  GERD     - continue home regimen  Chronic pain     - continue home regimen  Chronic combined systolic/diastolic HF     - continue home regimen  DVT prophylaxis: eliquis Code Status: FULL  Family Communication: None at bedside  Consults called: None   Status is: Inpatient  Remains inpatient appropriate because: severity of illness  Jonnie Finner DO [facility]  If 7PM-7AM, please contact night-coverage [email or web address]  10/18/2021, 9:19 AM

## 2021-10-18 NOTE — ED Notes (Signed)
Pt blood was taken at 1407 and the result was 100

## 2021-10-18 NOTE — ED Provider Notes (Signed)
[facility] DEPT Provider Note   CSN: [id number] Arrival date & time: 10/17/21  2317     History Chief Complaint  Patient presents with   Hypoglycemia    Thomas [name] is a 70 y.o. male.  HPI  70 year old male with a history of arthritis, BPH, chronic pain syndrome, CKD, diabetes, gout, hypertension, physical deconditioning, who presents to the emergency department today for evaluation of hyperglycemia.  Patient states that he was organizing his home when he fell asleep on the floor.  He states his blood sugar dropped to the 50s.  EMS arrived and noted his blood sugar to be low as well.  I evaluated the patient in triage and he was drinking orange juice.  He states he was organizing some paperwork while sitting in a chair when he fell asleep.  When he woke up his blood sugar was in the 50s.  He had been checking it several times earlier today and it was not low.  Of note, patient was recently admitted for hypoglycemia earlier this month. He was discharged 4 days ago and advised to stop his sulfonylurea.  He states he was not aware of this and he has still been taking it at home.  Past Medical History:  Diagnosis Date   Arthritis    BPH (benign prostatic hyperplasia) 11/24/2019   Chronic pain syndrome 11/24/2019   CKD (chronic kidney disease), stage IV ([place]) 11/24/2019   Diabetes mellitus without complication ([place])    Gout    Hypertension    Muscle pain    Physical deconditioning 11/24/2019   Scars    Swelling     Patient Active Problem List   Diagnosis Date Noted   Acute metabolic encephalopathy 07/12/3006   Hyperkalemia 10/09/2021   Hypothyroidism 10/09/2021   Hydronephrosis of right kidney 10/09/2021   Hypoglycemia 10/07/2021   DVT (deep venous thrombosis) ([place]) 10/07/2021   ARF (acute renal failure) ([place]) 09/17/2021   CAP (community acquired pneumonia) 09/17/2021   Cellulitis of left lower extremity    Pressure injury of skin 06/03/2021    AKI (acute kidney injury) ([place]) 06/02/2021   Pain management    Cord compression (HCC)    Drug-induced constipation    Acute midline low back pain with sciatica    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 ([place]) 08/30/2020   Urinary retention 12/04/2019   BPH (benign prostatic hyperplasia) 11/24/2019   CKD (chronic kidney disease), stage IV ([place]) 11/24/2019   Chronic pain syndrome 11/24/2019   Physical deconditioning 11/24/2019   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   Pulmonary emboli ([place]) 09/29/2018   [name], right ([place]) 09/29/2018   [name], type 2) ([place]) 09/29/2018   Gout 09/29/2018   HTN (hypertension) 09/29/2018   Chronic combined systolic and diastolic CHF (congestive heart failure) ([place]) 09/29/2018   GERD (gastroesophageal reflux disease) 09/29/2018    Past Surgical History:  Procedure Laterality Date   BELOW KNEE LEG AMPUTATION Right    ESOPHAGOGASTRODUODENOSCOPY (EGD) WITH PROPOFOL N/A 11/25/2019   Procedure: ESOPHAGOGASTRODUODENOSCOPY (EGD) WITH PROPOFOL;  Surgeon: Carol Ada, MD;  Location: [facility];  Service: Endoscopy;  Laterality: N/A;   IR FLUORO GUIDE CV LINE RIGHT  09/05/2019   IR LUMBAR DISC ASPIRATION W/IMG GUIDE  11/26/2019   IR US GUIDE VASC ACCESS RIGHT  09/05/2019   RADIOLOGY WITH ANESTHESIA N/A 11/25/2019   Procedure: Thoracic and Lumbar MRI WITH ANESTHESIA;  Surgeon: Radiologist, Medication, MD;  Location: [facility];  Service: Radiology;  Laterality: N/A;       Family History  Problem Relation Age of Onset   Heart disease Mother    Cancer Father     Social History   Tobacco Use   Smoking status: Never    Passive exposure: Never   Smokeless tobacco: Never  Vaping Use   Vaping Use: Never used  Substance Use Topics   Alcohol use: No   Drug use: No    Home Medications Prior to Admission medications   Medication Sig Start Date End Date Taking? Authorizing Provider  acetaminophen (TYLENOL) 500 MG tablet  Take 2 tablets (1,000 mg total) by mouth every 8 (eight) hours. Patient taking differently: Take 1,000 mg by mouth every 8 (eight) hours as needed for moderate pain. 10/03/20   Jeralyn Bennett, MD  allopurinol (ZYLOPRIM) 100 MG tablet Take 100 mg by mouth daily.     [provider]  amLODipine (NORVASC) 5 MG tablet Take 5 tablets by mouth daily.    [provider]  apixaban (ELIQUIS) 5 MG TABS tablet Take 1 tablet by mouth 2 times daily. 10/22/21 01/20/22  Kayleen Memos, DO  atorvastatin (LIPITOR) 80 MG tablet Take 1 tablet (80 mg total) by mouth daily. 09/25/20   Jose Persia, MD  cloNIDine (CATAPRES - DOSED IN MG/24 HR) 0.3 mg/24hr patch Place 1 patch onto the skin once a week. 10/01/21 10/31/21  Kayleen Memos, DO  colchicine 0.6 MG tablet Take 0.6 mg by mouth daily. 08/21/21   [provider]  cyanocobalamin 100 MCG tablet Take 2 tablets by mouth daily. 10/01/21 10/31/21  Kayleen Memos, DO  ferrous sulfate 325 (65 FE) MG tablet Take 1 tablet by mouth daily with breakfast. 10/01/21 10/31/21  Kayleen Memos, DO  finasteride (PROSCAR) 5 MG tablet Take 1 tablet (5 mg total) by mouth daily. 10/10/19   Hosie Poisson, MD  gabapentin (NEURONTIN) 300 MG capsule Take 1 capsule (300 mg total) by mouth at bedtime. Patient taking differently: Take 300 mg by mouth every 8 (eight) hours. 06/06/21   Nita Sells, MD  hydrALAZINE (APRESOLINE) 100 MG tablet Take 1 tablet by mouth every 8 hours. 10/01/21 10/31/21  Kayleen Memos, DO  lactulose (CHRONULAC) 10 GM/15ML solution Take 15 mLs (10 g total) by mouth 2 (two) times daily. 09/24/20   Jose Persia, MD  levothyroxine (SYNTHROID) 50 MCG tablet Take 50 mcg by mouth daily before breakfast.     [provider]  methocarbamol (ROBAXIN) 750 MG tablet Take 1 tablet (750 mg total) by mouth every 8 (eight) hours as needed for muscle spasms. 06/06/21   Nita Sells, MD  metoprolol tartrate (LOPRESSOR) 50 MG tablet Take 1  tablet by mouth 2 times daily. 10/01/21 10/31/21  Kayleen Memos, DO  naloxegol oxalate (MOVANTIK) 12.5 MG TABS tablet Take 1 tablet by mouth daily. 10/01/21 10/31/21  Kayleen Memos, DO  OXYCONTIN 30 MG 12 hr tablet Take 1 tablet (30 mg total) by mouth 2 (two) times daily. 10/14/21   Danford, Suann Larry, MD  pantoprazole (PROTONIX) 40 MG tablet Take 1 tablet by mouth 2 times daily. 10/01/21 10/31/21  Kayleen Memos, DO  polyethylene glycol (MIRALAX) 17 g packet Take 17 g by mouth daily as needed. 10/01/21   Kayleen Memos, DO  saccharomyces boulardii (FLORASTOR) 250 MG capsule Take 1 capsule by mouth 2 times daily. 10/01/21 10/31/21  Kayleen Memos, DO  tamsulosin (FLOMAX) 0.4 MG CAPS capsule Take 1 capsule (  0.4 mg total) by mouth daily. 09/25/20   Jose Persia, MD    Allergies    Other, Oxymorphone, Aspirin, Beta vulgaris, Buspirone, Cabb[place], Codeine, Dolobid [diflunisal], Fish allergy, Fish-derived products, Methadone, Pentazocine, Propoxyphene, Shellfish allergy, Sulfa antibiotics, Sulfasalazine, Vistaril [hydroxyzine], and Amoxicillin  Review of Systems   Review of Systems  Constitutional:  Negative for fever.  HENT:  Negative for ear pain and sore throat.   Eyes:  Negative for visual disturbance.  Respiratory:  Negative for cough and shortness of breath.   Cardiovascular:  Negative for chest pain.  Gastrointestinal:  Negative for abdominal pain, constipation, diarrhea, nausea and vomiting.  Genitourinary:  Negative for dysuria and hematuria.  Musculoskeletal:  Positive for back pain (chronic).  Skin:  Negative for rash.  Neurological:  Positive for weakness. Negative for syncope.  All other systems reviewed and are negative.  Physical Exam Updated Vital Signs BP (!) 153/86 (BP Location: Left Arm)    Pulse 81    Temp 98.3 F (36.8 C) (Oral)    Resp 16    SpO2 92%   Physical Exam Vitals and nursing note reviewed.  Constitutional:      General: He is not in acute distress.     Appearance: He is well-developed.  HENT:     Head: Normocephalic and atraumatic.  Eyes:     Conjunctiva/sclera: Conjunctivae normal.  Cardiovascular:     Rate and Rhythm: Normal rate and regular rhythm.     Heart sounds: Normal heart sounds. No murmur heard. Pulmonary:     Effort: Pulmonary effort is normal. No respiratory distress.     Breath sounds: Normal breath sounds. No wheezing, rhonchi or rales.  Abdominal:     General: Bowel sounds are normal.     Palpations: Abdomen is soft.     Tenderness: There is no abdominal tenderness. There is no guarding or rebound.  Musculoskeletal:        General: No swelling.     Cervical back: Neck supple.  Skin:    General: Skin is warm and dry.     Capillary Refill: Capillary refill takes less than 2 seconds.  Neurological:     Mental Status: He is alert.  Psychiatric:        Mood and Affect: Mood normal.    ED Results / Procedures / Treatments   Labs (all labs ordered are listed, but only abnormal results are displayed) Labs Reviewed  COMPREHENSIVE METABOLIC PANEL - Abnormal; Notable for the following components:      Result Value   Potassium 5.6 (*)    CO2 20 (*)    Glucose, Bld 44 (*)    BUN 65 (*)    Creatinine, Ser 4.43 (*)    Calcium 7.2 (*)    GFR, Estimated 14 (*)    All other components within normal limits  CBC - Abnormal; Notable for the following components:   RBC 2.75 (*)    Hemoglobin 8.8 (*)    HCT 28.2 (*)    MCV 102.5 (*)    RDW 16.1 (*)    Platelets 148 (*)    All other components within normal limits  CBG MONITORING, ED - Abnormal; Notable for the following components:   Glucose-Capillary 36 (*)    All other components within normal limits  CBG MONITORING, ED - Abnormal; Notable for the following components:   Glucose-Capillary 51 (*)    All other components within normal limits  CBG MONITORING, ED - Abnormal; Notable for the following  components:   Glucose-Capillary 144 (*)    All other components  within normal limits  CULTURE, BLOOD (ROUTINE X 2)  CULTURE, BLOOD (ROUTINE X 2)  LACTIC ACID, PLASMA  LACTIC ACID, PLASMA  URINALYSIS, ROUTINE W REFLEX MICROSCOPIC    EKG EKG Interpretation  Date/Time:  Friday October 18 2021 00:04:57 EST Ventricular Rate:  79 PR Interval:  200 QRS Duration: 102 QT Interval:  420 QTC Calculation: 482 R Axis:   17 Text Interpretation: Sinus rhythm Low voltage, precordial leads Borderline prolonged QT interval Confirmed by Nanda Quinton [phone]) on 10/18/2021 12:16:02 AM  Radiology DG Chest 2 View  Result Date: 10/18/2021 CLINICAL DATA:  Hypoglycemia EXAM: CHEST - 2 VIEW COMPARISON:  10/07/2021 FINDINGS: Persistent bilateral interstitial pulmonary edema, unchanged. Mild cardiomegaly. No pleural effusion. IMPRESSION: Unchanged cardiomegaly and interstitial pulmonary edema. Electronically Signed   By: Ulyses Jarred M.D.   On: 10/18/2021 00:26    Procedures Procedures   CRITICAL CARE Performed by: Rodney Booze   Total critical care time: 37 minutes  Critical care time was exclusive of separately billable procedures and treating other patients.  Critical care was necessary to treat or prevent imminent or life-threatening deterioration.  Critical care was time spent personally by me on the following activities: development of treatment plan with patient and/or surrogate as well as nursing, discussions with consultants, evaluation of patient's response to treatment, examination of patient, obtaining history from patient or surrogate, ordering and performing treatments and interventions, ordering and review of laboratory studies, ordering and review of radiographic studies, pulse oximetry and re-evaluation of patient's condition.   Medications Ordered in ED Medications  sodium zirconium cyclosilicate (LOKELMA) packet 10 g (has no administration in time range)  sodium chloride 0.9 % bolus 500 mL (has no administration in time range)  dextrose  10 % infusion (has no administration in time range)  dextrose 50 % solution 50 mL (50 mLs Intravenous Given 10/18/21 0104)    ED Course  I have reviewed the triage vital signs and the nursing notes.  Pertinent labs & imaging results that were available during my care of the patient were reviewed by me and considered in my medical decision making (see chart for details).    MDM Rules/Calculators/A&P                          70 y/o male presents for eval of hypoglycemia  Reviewed/interpreted labs CBC w/o leukocytosis, stable anemia CMP with hypoglycemia, hyperkalemia, aki Lactic wnl Blood cultures obtained UA pending on admission  EKG - Sinus rhythm Low voltage, precordial leads Borderline prolonged QT interva  Reviewed/interpreted imaging CXR - Unchanged cardiomegaly and interstitial pulmonary edema.  Pt with persistent hypoglycemia, aki and hyperkalemia. Has been given po, iv d50 and started on d10 infusion. Reportedly still taking glipizide which was supposed to have been discontinued. He was also started on a small fluid bolus and given lokelma for his hyperkalemia. Will admit for further treatment   2:37 AM CONSULT With Dr. Velia Meyer who accepts patient for admission   Final Clinical Impression(s) / ED Diagnoses Final diagnoses:  Hypoglycemia  AKI (acute kidney injury) [facility])  Hyperkalemia    Rx / DC Orders ED Discharge Orders     None        Rodney Booze, PA-C 10/18/21 [id number]    Margette Fast, MD 10/18/21 [phone]

## 2021-10-19 LAB — COMPREHENSIVE METABOLIC PANEL
ALT: 10 U/L (ref 0–44)
AST: 22 U/L (ref 15–41)
Albumin: 2.9 g/dL — ABNORMAL LOW (ref 3.5–5.0)
Alkaline Phosphatase: 63 U/L (ref 38–126)
Anion gap: 7 (ref 5–15)
BUN: 53 mg/dL — ABNORMAL HIGH (ref 8–23)
CO2: 24 mmol/L (ref 22–32)
Calcium: 7.2 mg/dL — ABNORMAL LOW (ref 8.9–10.3)
Chloride: 104 mmol/L (ref 98–111)
Creatinine, Ser: 3.59 mg/dL — ABNORMAL HIGH (ref 0.61–1.24)
GFR, Estimated: 18 mL/min — ABNORMAL LOW (ref >60)
Glucose, Bld: 130 mg/dL — ABNORMAL HIGH (ref 70–99)
Potassium: 4.8 mmol/L (ref 3.5–5.1)
Sodium: 135 mmol/L (ref 135–145)
Total Bilirubin: 0.5 mg/dL (ref 0.3–1.2)
Total Protein: 6.9 g/dL (ref 6.5–8.1)

## 2021-10-19 LAB — GLUCOSE, CAPILLARY
Glucose-Capillary: 120 mg/dL — ABNORMAL HIGH (ref 70–99)
Glucose-Capillary: 121 mg/dL — ABNORMAL HIGH (ref 70–99)
Glucose-Capillary: 123 mg/dL — ABNORMAL HIGH (ref 70–99)
Glucose-Capillary: 138 mg/dL — ABNORMAL HIGH (ref 70–99)
Glucose-Capillary: 139 mg/dL — ABNORMAL HIGH (ref 70–99)
Glucose-Capillary: 141 mg/dL — ABNORMAL HIGH (ref 70–99)
Glucose-Capillary: 145 mg/dL — ABNORMAL HIGH (ref 70–99)
Glucose-Capillary: 148 mg/dL — ABNORMAL HIGH (ref 70–99)
Glucose-Capillary: 151 mg/dL — ABNORMAL HIGH (ref 70–99)
Glucose-Capillary: 152 mg/dL — ABNORMAL HIGH (ref 70–99)
Glucose-Capillary: 163 mg/dL — ABNORMAL HIGH (ref 70–99)
Glucose-Capillary: 180 mg/dL — ABNORMAL HIGH (ref 70–99)

## 2021-10-19 LAB — CBC
HCT: 23.8 % — ABNORMAL LOW (ref 39.0–52.0)
Hemoglobin: 7.6 g/dL — ABNORMAL LOW (ref 13.0–17.0)
MCH: 31.3 pg (ref 26.0–34.0)
MCHC: 31.9 g/dL (ref 30.0–36.0)
MCV: 97.9 fL (ref 80.0–100.0)
Platelets: 129 10*3/uL — ABNORMAL LOW (ref 150–400)
RBC: 2.43 MIL/uL — ABNORMAL LOW (ref 4.22–5.81)
RDW: 15.3 % (ref 11.5–15.5)
WBC: 6 10*3/uL (ref 4.0–10.5)
nRBC: 0 % (ref 0.0–0.2)

## 2021-10-19 MED ORDER — CLONIDINE HCL 0.3 MG PO TABS: 0.3000 mg | ORAL_TABLET | Freq: Three times a day (TID) | ORAL | Status: DC

## 2021-10-19 MED ORDER — AMLODIPINE BESYLATE 5 MG PO TABS
5.0000 mg | ORAL_TABLET | Freq: Every day | ORAL | Status: DC | PRN
  Administered 2021-10-20: 5 mg via ORAL
  Filled 2021-10-19: qty 1

## 2021-10-19 MED ORDER — METHOCARBAMOL 500 MG PO TABS: 750.0000 mg | ORAL_TABLET | Freq: Three times a day (TID) | ORAL | Status: DC | PRN

## 2021-10-19 MED ORDER — METOPROLOL TARTRATE 50 MG PO TABS
100.0000 mg | ORAL_TABLET | Freq: Every day | ORAL | Status: DC
  Administered 2021-10-19 – 2021-10-22 (×4): 100 mg via ORAL
  Filled 2021-10-19: qty 2
  Filled 2021-10-19 (×2): qty 4
  Filled 2021-10-19 (×2): qty 2

## 2021-10-19 MED ORDER — TAMSULOSIN HCL 0.4 MG PO CAPS
0.4000 mg | ORAL_CAPSULE | Freq: Every day | ORAL | Status: DC
  Administered 2021-10-19 – 2021-10-22 (×4): 0.4 mg via ORAL
  Filled 2021-10-19 (×4): qty 1

## 2021-10-19 MED ORDER — BISACODYL 10 MG RE SUPP
10.0000 mg | Freq: Every day | RECTAL | Status: DC | PRN
  Administered 2021-10-20 – 2021-10-22 (×2): 10 mg via RECTAL
  Filled 2021-10-19 (×2): qty 1

## 2021-10-19 MED ORDER — ATORVASTATIN CALCIUM 40 MG PO TABS
80.0000 mg | ORAL_TABLET | Freq: Every day | ORAL | Status: DC
  Administered 2021-10-19 – 2021-10-22 (×4): 80 mg via ORAL
  Filled 2021-10-19 (×4): qty 2

## 2021-10-19 MED ORDER — BISACODYL 10 MG RE SUPP
10.0000 mg | Freq: Once | RECTAL | Status: AC
  Administered 2021-10-19: 10 mg via RECTAL
  Filled 2021-10-19: qty 1

## 2021-10-19 MED ORDER — LEVOTHYROXINE SODIUM 50 MCG PO TABS
50.0000 ug | ORAL_TABLET | Freq: Every day | ORAL | Status: DC
  Administered 2021-10-20 – 2021-10-22 (×3): 50 ug via ORAL
  Filled 2021-10-19 (×3): qty 1

## 2021-10-19 MED ORDER — ALLOPURINOL 100 MG PO TABS
100.0000 mg | ORAL_TABLET | Freq: Every day | ORAL | Status: DC
  Administered 2021-10-19 – 2021-10-22 (×4): 100 mg via ORAL
  Filled 2021-10-19 (×4): qty 1

## 2021-10-19 MED ORDER — OXYCODONE HCL ER 15 MG PO T12A
30.0000 mg | EXTENDED_RELEASE_TABLET | Freq: Two times a day (BID) | ORAL | Status: DC
  Administered 2021-10-19 – 2021-10-22 (×7): 30 mg via ORAL
  Filled 2021-10-19 (×7): qty 2

## 2021-10-19 MED ORDER — HYDRALAZINE HCL 20 MG/ML IJ SOLN
5.0000 mg | Freq: Once | INTRAMUSCULAR | Status: AC
  Administered 2021-10-19: 5 mg via INTRAVENOUS
  Filled 2021-10-19: qty 1

## 2021-10-19 MED ORDER — COLCHICINE 0.6 MG PO TABS
0.6000 mg | ORAL_TABLET | Freq: Every day | ORAL | Status: DC
  Administered 2021-10-19 – 2021-10-22 (×4): 0.6 mg via ORAL
  Filled 2021-10-19 (×4): qty 1

## 2021-10-19 MED ORDER — GABAPENTIN 300 MG PO CAPS
300.0000 mg | ORAL_CAPSULE | Freq: Every day | ORAL | Status: DC
  Administered 2021-10-19 – 2021-10-21 (×3): 300 mg via ORAL
  Filled 2021-10-19 (×3): qty 1

## 2021-10-19 MED ORDER — CLONIDINE HCL 0.1 MG PO TABS
0.1000 mg | ORAL_TABLET | Freq: Three times a day (TID) | ORAL | Status: DC
  Administered 2021-10-19 – 2021-10-22 (×11): 0.1 mg via ORAL
  Filled 2021-10-19 (×11): qty 1

## 2021-10-19 MED ORDER — FINASTERIDE 5 MG PO TABS
5.0000 mg | ORAL_TABLET | Freq: Every day | ORAL | Status: DC
  Administered 2021-10-19 – 2021-10-22 (×4): 5 mg via ORAL
  Filled 2021-10-19 (×4): qty 1

## 2021-10-19 NOTE — Progress Notes (Signed)
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  His blood sugar was found to be in the 30s.  He was started on D10 drip.  Review of the chart indicates that patient was discharged on 10/15/2019 was admitted with hypoglycemia and he was told to stop sulfonylureas in the setting of however he continued to take it.  6 days prior to that admission he was admitted with AKI . He has been having multiple hospital admissions.  Assessment & Plan:   Principal Problem:   Hypoglycemia  #1 Hypoglycemia-drug induced.  He has been on D10 drip at 100 cc an hour.  Blood sugar has been stable.  We will decrease the drip to 50 cc an hour.  He was instructed to stop sulfonylurea 6 days ago when he was discharged from the hospital.  Patient reports that he continue taking those medications it appears that he probably did not understand which ones he should be taking and which ones he should not be taking. Last hemoglobin A1c was 6.0 December 2022. TOC consulted for home health   #2 hypertension blood pressure remains elevated restart his home medications clonidine, metoprolol  #3 history of PE and DVT left lower extremity on Eliquis  #4 CKD stage IV monitor closely.  Improving.  #5 anemia of chronic disease/CKD-no evidence of active bleeding patient's hemoglobin  7.6 down from admission likely related to hemodilution.  #6 history of gout restart allopurinol and colchicine.  #7 BPH restart Flomax  #8 hypothyroidism restart Synthroid  #9 chronic pain DC Dilaudid and continue home oxycodone.  #57 combined systolic diastolic heart failure continue home meds stable.   Pressure Injury  10/18/21 Buttocks Left Stage 1 -  Intact skin with non-blanchable redness of a localized area usually over a bony prominence. slightly pink (Active)  10/18/21 1615  Location: Buttocks  Location Orientation: Left  Staging: Stage 1 -  Intact skin with non-blanchable redness of a localized area usually over a bony prominence.  Wound Description (Comments): slightly pink  Present on Admission: Yes    Estimated body mass index is 32.51 kg/m as calculated from the following:   Height as of this encounter: 6\' 6"  (1.981 m).   Weight as of this encounter: 127.6 kg.  DVT prophylaxis: ELIQUIS Code Status:FULL Family Communication: NONE AT BEDSIDE  Disposition Plan:  Status is: Inpatient  Remains inpatient appropriate because: Persistent hypoglycemia on IV D10  Consultants:  NONE  Procedures: None Antimicrobials: None Subjective:  He is resting in bed he is awake and alert Has right BKA 2020 Objective: Vitals:   10/19/21 0311 10/19/21 0400 10/19/21 0600 10/19/21 0800  BP: (!) 170/82 (!) 149/59 (!) 152/67 (!) 164/83  Pulse: 79 72 75 77  Resp: 12 17 13 16   Temp:    98.6 F (37 C)  TempSrc:    Axillary  SpO2: 94% 96% 96% 97%  Weight: 127.6 kg     Height:        Intake/Output Summary (Last 24 hours) at 10/19/2021 1040 Last data filed at 10/19/2021 0921 Gross per 24 hour  Intake 2685.44 ml  Output 4600 ml  Net -1914.56 ml   Filed Weights   10/18/21 1406 10/18/21 1800 10/19/21 0311  Weight: 124.1 kg 126.8 kg 127.6 kg    Examination:  General exam: Appears calm and comfortable  Respiratory system: Clear to auscultation. Respiratory effort normal. Cardiovascular system: S1 & S2 heard, RRR. No JVD, murmurs, rubs, gallops or clicks. No pedal edema. Gastrointestinal system: Abdomen is nondistended, soft and nontender. No organomegaly or masses felt. Normal bowel sounds heard. Central nervous system: Alert and oriented. No focal neurological deficits. Extremities: Right  below-knee amputation left lower extremity with multiple scratch markings and erythema Skin: No rashes, lesions or ulcers Psychiatry: Judgement and insight appear normal. Mood & affect appropriate.     Data Reviewed: I have personally reviewed following labs and imaging studies  CBC: Recent Labs  Lab 10/18/21 0037 10/18/21 0607 10/19/21 0244  WBC 8.0 5.8 6.0  HGB 8.8* 7.7* 7.6*  HCT 28.2* 24.5* 23.8*  MCV 102.5* 100.8* 97.9  PLT 148* 118* 720*   Basic Metabolic Panel: Recent Labs  Lab 10/13/21 0417 10/14/21 0354 10/18/21 0037 10/18/21 0607 10/19/21 0244  NA 138 138 136 134* 135  K 5.1 5.4* 5.6* 4.6 4.8  CL 108 108 103 103 104  CO2 24 25 20* 24 24  GLUCOSE 120* 160* 44* 101* 130*  BUN 62* 55* 65* 64* 53*  CREATININE 2.75* 2.88* 4.43* 4.33* 3.59*  CALCIUM 8.2* 7.8* 7.2* 6.8* 7.2*  MG  --   --   --  1.9  --    GFR: Estimated Creatinine Clearance: 29.5 mL/min (A) (by C-G formula based on SCr of 3.59 mg/dL (H)). Liver Function Tests: Recent Labs  Lab 10/18/21 0037 10/18/21 0607 10/19/21 0244  AST 32 22 22  ALT 9 9 10   ALKPHOS 75 68 63  BILITOT 0.9 0.6 0.5  PROT 8.1 7.2 6.9  ALBUMIN 3.5 2.9* 2.9*   No results for input(s): LIPASE, AMYLASE in the last 168 hours. No results for input(s): AMMONIA in the last 168 hours. Coagulation Profile: No results for input(s): INR, PROTIME in the last 168 hours. Cardiac Enzymes: No results for input(s): CKTOTAL, CKMB, CKMBINDEX, TROPONINI in the last 168 hours. BNP (last 3 results) No results for input(s): PROBNP in the last 8760 hours. HbA1C: No results for input(s): HGBA1C in the last 72 hours. CBG: Recent Labs  Lab 10/18/21 2359 10/19/21 0243 10/19/21 0500 10/19/21 0658 10/19/21 0859  GLUCAP 121* 123* 120* 148* 138*   Lipid Profile: No results for input(s): CHOL, HDL, LDLCALC, TRIG, CHOLHDL, LDLDIRECT in the last 72 hours. Thyroid Function Tests: No results for input(s): TSH, T4TOTAL, FREET4, T3FREE, THYROIDAB  in the last 72 hours. Anemia Panel: No results for input(s): VITAMINB12, FOLATE, FERRITIN, TIBC, IRON, RETICCTPCT in the last 72 hours. Sepsis Labs: Recent Labs  Lab 10/18/21 0037 10/18/21 9470  LATICACIDVEN 1.7 0.9    Recent Results (from the past 240 hour(s))  Resp Panel by RT-PCR (Flu A&B, Covid) Urine, Clean Catch     Status: None   Collection Time: 10/18/21  4:03 AM   Specimen: Urine, Clean Catch; Nasopharyngeal(NP) swabs in vial transport medium  Result Value Ref Range Status   SARS Coronavirus 2 by RT PCR NEGATIVE NEGATIVE Final    Comment: (NOTE) SARS-CoV-2 target nucleic acids are NOT DETECTED.  The SARS-CoV-2 RNA is generally detectable in upper respiratory specimens during the acute phase of infection. The lowest concentration of SARS-CoV-2 viral copies this assay can detect is 138 copies/mL. A negative result does not preclude SARS-Cov-2 infection and should not be used as the sole basis for treatment or other patient  management decisions. A negative result may occur with  improper specimen collection/handling, submission of specimen other than nasopharyngeal swab, presence of viral mutation(s) within the areas targeted by this assay, and inadequate number of viral copies(<138 copies/mL). A negative result must be combined with clinical observations, patient history, and epidemiological information. The expected result is Negative.  Fact Sheet for Patients:  EntrepreneurPulse.com.au  Fact Sheet for Healthcare Providers:  IncredibleEmployment.be  This test is no t yet approved or cleared by the [place] FDA and  has been authorized for detection and/or diagnosis of SARS-CoV-2 by FDA under an Emergency Use Authorization (EUA). This EUA will remain  in effect (meaning this test can be used) for the duration of the COVID-19 declaration under Section 564(b)(1) of the Act, 21 U.S.C.section 360bbb-3(b)(1), unless the  authorization is terminated  or revoked sooner.       Influenza A by PCR NEGATIVE NEGATIVE Final   Influenza B by PCR NEGATIVE NEGATIVE Final    Comment: (NOTE) The Xpert Xpress SARS-CoV-2/FLU/RSV plus assay is intended as an aid in the diagnosis of influenza from Nasopharyngeal swab specimens and should not be used as a sole basis for treatment. Nasal washings and aspirates are unacceptable for Xpert Xpress SARS-CoV-2/FLU/RSV testing.  Fact Sheet for Patients: EntrepreneurPulse.com.au  Fact Sheet for Healthcare Providers: IncredibleEmployment.be  This test is not yet approved or cleared by the [place] FDA and has been authorized for detection and/or diagnosis of SARS-CoV-2 by FDA under an Emergency Use Authorization (EUA). This EUA will remain in effect (meaning this test can be used) for the duration of the COVID-19 declaration under Section 564(b)(1) of the Act, 21 U.S.C. section 360bbb-3(b)(1), unless the authorization is terminated or revoked.  Performed at [facility], [place] [address] [place], [place] [address]   MRSA Next Gen by PCR, Nasal     Status: None   Collection Time: 10/18/21  3:21 PM   Specimen: Nasal Mucosa; Nasal Swab  Result Value Ref Range Status   MRSA by PCR Next Gen NOT DETECTED NOT DETECTED Final    Comment: (NOTE) The GeneXpert MRSA Assay (FDA approved for NASAL specimens only), is one component of a comprehensive MRSA colonization surveillance program. It is not intended to diagnose MRSA infection nor to guide or monitor treatment for MRSA infections. Test performance is not FDA approved in patients less than 57 years old. Performed at [facility], [place] [address], [place], [place] [address]          Radiology Studies: DG Chest 2 View  Result Date: 10/18/2021 CLINICAL DATA:  Hypoglycemia EXAM: CHEST - 2 VIEW COMPARISON:  10/07/2021 FINDINGS: Persistent  bilateral interstitial pulmonary edema, unchanged. Mild cardiomegaly. No pleural effusion. IMPRESSION: Unchanged cardiomegaly and interstitial pulmonary edema. Electronically Signed   By: Ulyses Jarred M.D.   On: 10/18/2021 00:26        Scheduled Meds:  allopurinol  100 mg Oral Daily   apixaban  5 mg Oral BID   atorvastatin  80 mg Oral Daily   Chlorhexidine Gluconate Cloth  6 each Topical Daily   cloNIDine  0.1 mg Oral TID   colchicine  0.6 mg Oral Daily   finasteride  5 mg Oral Daily   gabapentin  300 mg Oral QHS   [START ON 10/20/2021] levothyroxine  50 mcg Oral QAC breakfast   mouth rinse  15 mL Mouth Rinse BID   metoprolol tartrate  100 mg Oral Daily   oxyCODONE  30 mg Oral  BID   tamsulosin  0.4 mg Oral Daily   Continuous Infusions:  dextrose 50 mL/hr at 10/19/21 1035     LOS: 1 day    Time spent: 13 min  Georgette Shell, MD  10/19/2021, 10:40 AM

## 2021-10-20 LAB — GLUCOSE, CAPILLARY
Glucose-Capillary: 180 mg/dL — ABNORMAL HIGH (ref 70–99)
Glucose-Capillary: 137 mg/dL — ABNORMAL HIGH (ref 70–99)
Glucose-Capillary: 116 mg/dL — ABNORMAL HIGH (ref 70–99)
Glucose-Capillary: 140 mg/dL — ABNORMAL HIGH (ref 70–99)
Glucose-Capillary: 150 mg/dL — ABNORMAL HIGH (ref 70–99)
Glucose-Capillary: 135 mg/dL — ABNORMAL HIGH (ref 70–99)
Glucose-Capillary: 117 mg/dL — ABNORMAL HIGH (ref 70–99)
Glucose-Capillary: 166 mg/dL — ABNORMAL HIGH (ref 70–99)
Glucose-Capillary: 158 mg/dL — ABNORMAL HIGH (ref 70–99)

## 2021-10-20 NOTE — Progress Notes (Signed)
Pt became verbally and physically combative at 2120 and 0000 - able to redirect - pt later states he was afraid he was woken up and thought he was being attacked, however pt was fully awake both times.

## 2021-10-20 NOTE — Progress Notes (Signed)
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  His blood sugar was found to be in the 30s.  He was started on D10 drip.  Review of the chart indicates that patient was discharged on 10/15/2019 was admitted with hypoglycemia and he was told to stop sulfonylureas in the setting of however he continued to take it.  6 days prior to that admission he was admitted with AKI . He has been having multiple hospital admissions.  Assessment & Plan:   Principal Problem:   Hypoglycemia  #1 Hypoglycemia-drug induced.  He has been on D10 drip at 50 cc an hour.  Blood sugar has been stable.  Will DC D10 drip.  Monitor his blood sugars closely. He was instructed to stop sulfonylurea 6 days ago when he was discharged from the hospital.  Patient reports that he continue taking those medications it appears that he probably did not understand which ones he should be taking and which ones he should not be taking. Last hemoglobin A1c was 6.0 December 2022. TOC consulted for home health  PT eval pending Out of bed ambulate Transfer to progressive care  #2 hypertension blood pressure 152/80.  His medications have been restarted yesterday.  We will continue the same.   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  Improving.  #5 anemia of chronic disease/CKD-no evidence of active bleeding patient's hemoglobin  7.6 down from admission likely related to hemodilution.  #6 history of gout restart allopurinol and colchicine.  #7 BPH restart Flomax  #8 hypothyroidism restart Synthroid  #9 chronic pain DC Dilaudid and continue home oxycodone.  #60  combined systolic diastolic heart failure continue home meds stable.   Pressure Injury 10/18/21 Buttocks Left Stage 1 -  Intact skin with non-blanchable redness of a localized area usually over a bony prominence. slightly pink (Active)  10/18/21 1615  Location: Buttocks  Location Orientation: Left  Staging: Stage 1 -  Intact skin with non-blanchable redness of a localized area usually over a bony prominence.  Wound Description (Comments): slightly pink  Present on Admission: Yes    Estimated body mass index is 32.3 kg/m as calculated from the following:   Height as of this encounter: 6\' 6"  (1.981 m).   Weight as of this encounter: 126.8 kg.  DVT prophylaxis: ELIQUIS Code Status:FULL Family Communication: NONE AT BEDSIDE  Disposition Plan:  Status is: Inpatient  Remains inpatient appropriate because: Persistent hypoglycemia on IV D10  Consultants:  NONE  Procedures: None Antimicrobials: None Subjective:  Patient is resting in bed He put the sheet over his face and said he wants to sleep Overnight he had some episodes of combativeness which was resolved with redirection PT eval pending TOC pending Objective: Vitals:   10/20/21 0020 10/20/21 0400 10/20/21 0500 10/20/21 0800  BP: (!) 159/66 (!) 147/87  (!) 152/80  Pulse: 70 64  65  Resp: 17 14  13   Temp:  98.6 F (37 C)  98.3 F (36.8 C)  TempSrc:  Oral  Axillary  SpO2: 95% 97%  96%  Weight:   126.8 kg   Height:        Intake/Output Summary (  Last 24 hours) at 10/20/2021 0932 Last data filed at 10/20/2021 0851 Gross per 24 hour  Intake 1440.37 ml  Output 4470 ml  Net -3029.63 ml    Filed Weights   10/18/21 1800 10/19/21 0311 10/20/21 0500  Weight: 126.8 kg 127.6 kg 126.8 kg    Examination:  General exam: Appears calm and comfortable  Respiratory system: Clear to auscultation. Respiratory effort normal. Cardiovascular system: S1 & S2 heard, RRR. No JVD, murmurs, rubs, gallops or clicks. No pedal  edema. Gastrointestinal system: Abdomen is nondistended, soft and nontender. No organomegaly or masses felt. Normal bowel sounds heard. Central nervous system: Alert and oriented. No focal neurological deficits. Extremities: Right below-knee amputation left lower extremity with multiple scratch markings and erythema Skin: No rashes, lesions or ulcers Psychiatry: Judgement and insight appear normal. Mood & affect appropriate.     Data Reviewed: I have personally reviewed following labs and imaging studies  CBC: Recent Labs  Lab 10/18/21 0037 10/18/21 0607 10/19/21 0244  WBC 8.0 5.8 6.0  HGB 8.8* 7.7* 7.6*  HCT 28.2* 24.5* 23.8*  MCV 102.5* 100.8* 97.9  PLT 148* 118* 129*    Basic Metabolic Panel: Recent Labs  Lab 10/14/21 0354 10/18/21 0037 10/18/21 0607 10/19/21 0244  NA 138 136 134* 135  K 5.4* 5.6* 4.6 4.8  CL 108 103 103 104  CO2 25 20* 24 24  GLUCOSE 160* 44* 101* 130*  BUN 55* 65* 64* 53*  CREATININE 2.88* 4.43* 4.33* 3.59*  CALCIUM 7.8* 7.2* 6.8* 7.2*  MG  --   --  1.9  --     GFR: Estimated Creatinine Clearance: 29.4 mL/min (A) (by C-G formula based on SCr of 3.59 mg/dL (H)). Liver Function Tests: Recent Labs  Lab 10/18/21 0037 10/18/21 0607 10/19/21 0244  AST 32 22 22  ALT 9 9 10   ALKPHOS 75 68 63  BILITOT 0.9 0.6 0.5  PROT 8.1 7.2 6.9  ALBUMIN 3.5 2.9* 2.9*    No results for input(s): LIPASE, AMYLASE in the last 168 hours. No results for input(s): AMMONIA in the last 168 hours. Coagulation Profile: No results for input(s): INR, PROTIME in the last 168 hours. Cardiac Enzymes: No results for input(s): CKTOTAL, CKMB, CKMBINDEX, TROPONINI in the last 168 hours. BNP (last 3 results) No results for input(s): PROBNP in the last 8760 hours. HbA1C: No results for input(s): HGBA1C in the last 72 hours. CBG: Recent Labs  Lab 10/19/21 2121 10/19/21 2335 10/20/21 0109 10/20/21 0304 10/20/21 0724  GLUCAP 180* 151* 180* 137* 116*    Lipid  Profile: No results for input(s): CHOL, HDL, LDLCALC, TRIG, CHOLHDL, LDLDIRECT in the last 72 hours. Thyroid Function Tests: No results for input(s): TSH, T4TOTAL, FREET4, T3FREE, THYROIDAB in the last 72 hours. Anemia Panel: No results for input(s): VITAMINB12, FOLATE, FERRITIN, TIBC, IRON, RETICCTPCT in the last 72 hours. Sepsis Labs: Recent Labs  Lab 10/18/21 0037 10/18/21 3154  LATICACIDVEN 1.7 0.9     Recent Results (from the past 240 hour(s))  Blood culture (routine x 2)     Status: None (Preliminary result)   Collection Time: 10/18/21 12:37 AM   Specimen: Right Antecubital; Blood  Result Value Ref Range Status   Specimen Description   Final    RIGHT ANTECUBITAL Performed at [facility], [place] [address] [place], [place] [address]    Special Requests   Final    BOTTLES DRAWN AEROBIC AND ANAEROBIC Blood Culture results may not be optimal due to an excessive  volume of blood received in culture bottles Performed at [place] [address], [place], [place] [address]    Culture   Final    NO GROWTH 1 DAY Performed at [facility], [place] [address] [place], [place] [address]    Report Status PENDING  Incomplete  Blood culture (routine x 2)     Status: None (Preliminary result)   Collection Time: 10/18/21 12:37 AM   Specimen: Left Antecubital; Blood  Result Value Ref Range Status   Specimen Description   Final    LEFT ANTECUBITAL Performed at [place] [address] [place], [place] [address]    Special Requests   Final    BOTTLES DRAWN AEROBIC AND ANAEROBIC Blood Culture adequate volume Performed at [place] [address] [place], [place] [address]    Culture   Final    NO GROWTH 1 DAY Performed at [facility], [place] [address], [place], [place] [address]    Report Status PENDING  Incomplete  Resp Panel by RT-PCR (Flu A&B, Covid) Urine, Clean Catch     Status:  None   Collection Time: 10/18/21  4:03 AM   Specimen: Urine, Clean Catch; Nasopharyngeal(NP) swabs in vial transport medium  Result Value Ref Range Status   SARS Coronavirus 2 by RT PCR NEGATIVE NEGATIVE Final    Comment: (NOTE) SARS-CoV-2 target nucleic acids are NOT DETECTED.  The SARS-CoV-2 RNA is generally detectable in upper respiratory specimens during the acute phase of infection. The lowest concentration of SARS-CoV-2 viral copies this assay can detect is 138 copies/mL. A negative result does not preclude SARS-Cov-2 infection and should not be used as the sole basis for treatment or other patient management decisions. A negative result may occur with  improper specimen collection/handling, submission of specimen other than nasopharyngeal swab, presence of viral mutation(s) within the areas targeted by this assay, and inadequate number of viral copies(<138 copies/mL). A negative result must be combined with clinical observations, patient history, and epidemiological information. The expected result is Negative.  Fact Sheet for Patients:  EntrepreneurPulse.com.au  Fact Sheet for Healthcare Providers:  IncredibleEmployment.be  This test is no t yet approved or cleared by the [place] FDA and  has been authorized for detection and/or diagnosis of SARS-CoV-2 by FDA under an Emergency Use Authorization (EUA). This EUA will remain  in effect (meaning this test can be used) for the duration of the COVID-19 declaration under Section 564(b)(1) of the Act, 21 U.S.C.section 360bbb-3(b)(1), unless the authorization is terminated  or revoked sooner.       Influenza A by PCR NEGATIVE NEGATIVE Final   Influenza B by PCR NEGATIVE NEGATIVE Final    Comment: (NOTE) The Xpert Xpress SARS-CoV-2/FLU/RSV plus assay is intended as an aid in the diagnosis of influenza from Nasopharyngeal swab specimens and should not be used as a sole basis for  treatment. Nasal washings and aspirates are unacceptable for Xpert Xpress SARS-CoV-2/FLU/RSV testing.  Fact Sheet for Patients: EntrepreneurPulse.com.au  Fact Sheet for Healthcare Providers: IncredibleEmployment.be  This test is not yet approved or cleared by the [place] FDA and has been authorized for detection and/or diagnosis of SARS-CoV-2 by FDA under an Emergency Use Authorization (EUA). This EUA will remain in effect (meaning this test can be used) for the duration of the COVID-19 declaration under Section 564(b)(1) of the Act, 21 U.S.C. section 360bbb-3(b)(1), unless the authorization is terminated or revoked.  Performed at [facility] [place] [address] [place], [place] [address]   MRSA Next Gen by PCR, Nasal     Status: None   Collection Time: 10/18/21  3:21 PM   Specimen: Nasal Mucosa; Nasal Swab  Result Value Ref Range Status   MRSA by PCR Next Gen NOT DETECTED NOT DETECTED Final    Comment: (NOTE) The GeneXpert MRSA Assay (FDA approved for NASAL specimens only), is one component of a comprehensive MRSA colonization surveillance program. It is not intended to diagnose MRSA infection nor to guide or monitor treatment for MRSA infections. Test performance is not FDA approved in patients less than 63 years old. Performed at [facility], [place] [address], [place], [place] [address]           Radiology Studies: No results found.      Scheduled Meds:  allopurinol  100 mg Oral Daily   apixaban  5 mg Oral BID   atorvastatin  80 mg Oral Daily   Chlorhexidine Gluconate Cloth  6 each Topical Daily   cloNIDine  0.1 mg Oral TID   colchicine  0.6 mg Oral Daily   finasteride  5 mg Oral Daily   gabapentin  300 mg Oral QHS   levothyroxine  50 mcg Oral QAC breakfast   mouth rinse  15 mL Mouth Rinse BID   metoprolol tartrate  100 mg Oral Daily   oxyCODONE  30 mg Oral BID   tamsulosin   0.4 mg Oral Daily   Continuous Infusions:  dextrose 50 mL/hr at 10/20/21 0800     LOS: 2 days    Time spent: 38 min  Georgette Shell, MD  10/20/2021, 9:32 AM

## 2021-10-20 NOTE — Consult Note (Signed)
[facility] Nurse Consult Note: Patient receiving care in [phone] Consult completed remotely after review of chart, images and flow sheet Reason for Consult: LLE wound from previous cellulitis Wound type: LLE ulcer Pressure Injury POA: NA Wound bed: pink Drainage (amount, consistency, odor) None Periwound: intact Dressing procedure/placement/frequency: Place a piece of Xeroform gauze over open wound on the LLE and secure with foam dressing or wrap with Kerlix. Change daily.  Cathlean Marseilles Tamala Julian, MSN, RN, [place], [place], [facility] Wound Treatment Associate Pager [phone]

## 2021-10-20 NOTE — Plan of Care (Signed)

## 2021-10-21 LAB — COMPREHENSIVE METABOLIC PANEL WITH GFR
ALT: 10 U/L (ref 0–44)
AST: 20 U/L (ref 15–41)
Albumin: 3.1 g/dL — ABNORMAL LOW (ref 3.5–5.0)
Alkaline Phosphatase: 74 U/L (ref 38–126)
Anion gap: 9 (ref 5–15)
BUN: 41 mg/dL — ABNORMAL HIGH (ref 8–23)
CO2: 23 mmol/L (ref 22–32)
Calcium: 8.1 mg/dL — ABNORMAL LOW (ref 8.9–10.3)
Chloride: 105 mmol/L (ref 98–111)
Creatinine, Ser: 2.73 mg/dL — ABNORMAL HIGH (ref 0.61–1.24)
GFR, Estimated: 25 mL/min — ABNORMAL LOW
Glucose, Bld: 114 mg/dL — ABNORMAL HIGH (ref 70–99)
Potassium: 4.6 mmol/L (ref 3.5–5.1)
Sodium: 137 mmol/L (ref 135–145)
Total Bilirubin: 0.6 mg/dL (ref 0.3–1.2)
Total Protein: 7.6 g/dL (ref 6.5–8.1)

## 2021-10-21 LAB — GLUCOSE, CAPILLARY
Glucose-Capillary: 102 mg/dL — ABNORMAL HIGH (ref 70–99)
Glucose-Capillary: 103 mg/dL — ABNORMAL HIGH (ref 70–99)
Glucose-Capillary: 107 mg/dL — ABNORMAL HIGH (ref 70–99)
Glucose-Capillary: 109 mg/dL — ABNORMAL HIGH (ref 70–99)
Glucose-Capillary: 110 mg/dL — ABNORMAL HIGH (ref 70–99)
Glucose-Capillary: 111 mg/dL — ABNORMAL HIGH (ref 70–99)
Glucose-Capillary: 112 mg/dL — ABNORMAL HIGH (ref 70–99)
Glucose-Capillary: 114 mg/dL — ABNORMAL HIGH (ref 70–99)
Glucose-Capillary: 123 mg/dL — ABNORMAL HIGH (ref 70–99)
Glucose-Capillary: 128 mg/dL — ABNORMAL HIGH (ref 70–99)
Glucose-Capillary: 128 mg/dL — ABNORMAL HIGH (ref 70–99)
Glucose-Capillary: 131 mg/dL — ABNORMAL HIGH (ref 70–99)
Glucose-Capillary: 150 mg/dL — ABNORMAL HIGH (ref 70–99)

## 2021-10-21 LAB — CBC
HCT: 27.4 % — ABNORMAL LOW (ref 39.0–52.0)
Hemoglobin: 8.9 g/dL — ABNORMAL LOW (ref 13.0–17.0)
MCH: 31.2 pg (ref 26.0–34.0)
MCHC: 32.5 g/dL (ref 30.0–36.0)
MCV: 96.1 fL (ref 80.0–100.0)
Platelets: 160 10*3/uL (ref 150–400)
RBC: 2.85 MIL/uL — ABNORMAL LOW (ref 4.22–5.81)
RDW: 14.9 % (ref 11.5–15.5)
WBC: 6.8 10*3/uL (ref 4.0–10.5)
nRBC: 0 % (ref 0.0–0.2)

## 2021-10-21 MED ORDER — OXYCODONE HCL 5 MG PO TABS
5.0000 mg | ORAL_TABLET | Freq: Once | ORAL | Status: AC
  Administered 2021-10-21: 5 mg via ORAL
  Filled 2021-10-21: qty 1

## 2021-10-21 MED ORDER — SODIUM CHLORIDE 0.9 % IV SOLN
12.5000 mg | Freq: Four times a day (QID) | INTRAVENOUS | Status: DC | PRN
  Administered 2021-10-21: 12.5 mg via INTRAVENOUS
  Filled 2021-10-21: qty 12.5
  Filled 2021-10-21: qty 0.5

## 2021-10-21 NOTE — Plan of Care (Signed)

## 2021-10-21 NOTE — Progress Notes (Signed)
Pt informed nurse he would like something for pain. He says his back popped when he moved, and he is in a lot of pain. MD notified.

## 2021-10-21 NOTE — Progress Notes (Signed)
PT Cancellation Note  Patient Details Name: Thomas [name] MRN: [id number] DOB: [birth year]   Cancelled Treatment:    Reason Eval/Treat Not Completed:  Attempted PT eval-pt c/o pain and nausea. Will check back as schedule allows.    [facility]  Office: [phone] Pager: [phone]

## 2021-10-21 NOTE — Progress Notes (Signed)
PT Cancellation Note  Patient Details Name: Thomas [name] MRN: [id number] DOB: Jan 17, 1951   Cancelled Treatment:    Reason Eval/Treat Not Completed:  2nd attempt to evaluate pt. Pt is now very drowsy after receiving meds. He again declined working with PT. Will check back on tomorrow.    [facility]  Office: [phone] Pager: [phone]

## 2021-10-21 NOTE — Progress Notes (Signed)
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  His blood sugar was found to be in the 30s.  He was started on D10 drip.  Review of the chart indicates that patient was discharged on 10/15/2019 was admitted with hypoglycemia and he was told to stop sulfonylureas in the setting of however he continued to take it.  6 days prior to that admission he was admitted with AKI . He has been having multiple hospital admissions.  Assessment & Plan:   Principal Problem:   Hypoglycemia  #1 Hypoglycemia-resolved.  Drug induced.  He was treated with D10 and then D5 drip with stable blood sugars.  Will not discharge him on sulfonylurea.  Last hemoglobin A1c was 6.0 December 2022. TOC consulted for home health  PT eval was attempted twice today patient refused.  He is refusing to be going home today to reporting he has no way of getting home or no one to settle him at home today Out of bed ambulate  #2 hypertension blood pressure 165/88 continue  #3 history of PE and DVT left lower extremity on Eliquis  #4 CKD stage IV monitor closely.  Improving.  #5 anemia of chronic disease/CKD-no evidence of active bleeding patient's hemoglobin  7.6 down from admission likely related to hemodilution.  #6 history of gout restart allopurinol and colchicine.  #7 BPH restart Flomax  #8 hypothyroidism restart Synthroid  #9 chronic pain DC Dilaudid and continue home oxycodone.  #05 combined systolic diastolic heart failure continue home meds stable.   Pressure Injury 10/18/21 Buttocks Left Stage 1 -  Intact skin with non-blanchable redness of a localized area usually over a bony prominence. slightly  pink (Active)  10/18/21 1615  Location: Buttocks  Location Orientation: Left  Staging: Stage 1 -  Intact skin with non-blanchable redness of a localized area usually over a bony prominence.  Wound Description (Comments): slightly pink  Present on Admission: Yes    Estimated body mass index is 32.3 kg/m as calculated from the following:   Height as of this encounter: 6\' 6"  (1.981 m).   Weight as of this encounter: 126.8 kg.  DVT prophylaxis: ELIQUIS Code Status:FULL Family Communication: NONE AT BEDSIDE  Disposition Plan:  Status is: Inpatient  Remains inpatient appropriate because: Persistent hypoglycemia on IV D10  Consultants:  NONE  Procedures: None Antimicrobials: None Subjective: drowsy this morning Still wants something for back pain  Objective: Vitals:   10/20/21 1948 10/21/21 0208 10/21/21 0432 10/21/21 1159  BP: (!) 150/78 (!) 157/87 (!) 142/95 (!) 165/88  Pulse: 67 73 73 76  Resp: 18 18 18 18   Temp: 98.5 F (36.9 C) 98.3 F (36.8 C) 98.6 F (37 C) 98.5 F (36.9 C)  TempSrc: Oral Oral Oral Oral  SpO2: 97% 96% 97% 92%  Weight:      Height:        Intake/Output Summary (Last 24 hours) at 10/21/2021 1529 Last data filed at 10/21/2021 1331 Gross per 24 hour  Intake 840 ml  Output 2350 ml  Net -1510 ml    Filed Weights   10/18/21 1800 10/19/21 0311 10/20/21 0500  Weight: 126.8 kg 127.6 kg 126.8 kg    Examination:  General exam: Appears  calm and comfortable  Respiratory system: Clear to auscultation. Respiratory effort normal. Cardiovascular system: S1 & S2 heard, RRR. No JVD, murmurs, rubs, gallops or clicks. No pedal edema. Gastrointestinal system: Abdomen is nondistended, soft and nontender. No organomegaly or masses felt. Normal bowel sounds heard. Central nervous system: Alert and oriented. No focal neurological deficits. Extremities: Right below-knee amputation left lower extremity with multiple scratch markings and erythema Skin: No rashes,  lesions or ulcers Psychiatry: Judgement and insight appear normal. Mood & affect appropriate.     Data Reviewed: I have personally reviewed following labs and imaging studies  CBC: Recent Labs  Lab 10/18/21 0037 10/18/21 0607 10/19/21 0244 10/21/21 0848  WBC 8.0 5.8 6.0 6.8  HGB 8.8* 7.7* 7.6* 8.9*  HCT 28.2* 24.5* 23.8* 27.4*  MCV 102.5* 100.8* 97.9 96.1  PLT 148* 118* 129* 032    Basic Metabolic Panel: Recent Labs  Lab 10/18/21 0037 10/18/21 0607 10/19/21 0244 10/21/21 0848  NA 136 134* 135 137  K 5.6* 4.6 4.8 4.6  CL 103 103 104 105  CO2 20* 24 24 23   GLUCOSE 44* 101* 130* 114*  BUN 65* 64* 53* 41*  CREATININE 4.43* 4.33* 3.59* 2.73*  CALCIUM 7.2* 6.8* 7.2* 8.1*  MG  --  1.9  --   --     GFR: Estimated Creatinine Clearance: 38.7 mL/min (A) (by C-G formula based on SCr of 2.73 mg/dL (H)). Liver Function Tests: Recent Labs  Lab 10/18/21 0037 10/18/21 0607 10/19/21 0244 10/21/21 0848  AST 32 22 22 20   ALT 9 9 10 10   ALKPHOS 75 68 63 74  BILITOT 0.9 0.6 0.5 0.6  PROT 8.1 7.2 6.9 7.6  ALBUMIN 3.5 2.9* 2.9* 3.1*    No results for input(s): LIPASE, AMYLASE in the last 168 hours. No results for input(s): AMMONIA in the last 168 hours. Coagulation Profile: No results for input(s): INR, PROTIME in the last 168 hours. Cardiac Enzymes: No results for input(s): CKTOTAL, CKMB, CKMBINDEX, TROPONINI in the last 168 hours. BNP (last 3 results) No results for input(s): PROBNP in the last 8760 hours. HbA1C: No results for input(s): HGBA1C in the last 72 hours. CBG: Recent Labs  Lab 10/21/21 0616 10/21/21 0758 10/21/21 1024 10/21/21 1157 10/21/21 1459  GLUCAP 111* 102* 107* 110* 128*    Lipid Profile: No results for input(s): CHOL, HDL, LDLCALC, TRIG, CHOLHDL, LDLDIRECT in the last 72 hours. Thyroid Function Tests: No results for input(s): TSH, T4TOTAL, FREET4, T3FREE, THYROIDAB in the last 72 hours. Anemia Panel: No results for input(s): VITAMINB12,  FOLATE, FERRITIN, TIBC, IRON, RETICCTPCT in the last 72 hours. Sepsis Labs: Recent Labs  Lab 10/18/21 0037 10/18/21 1224  LATICACIDVEN 1.7 0.9     Recent Results (from the past 240 hour(s))  Blood culture (routine x 2)     Status: None (Preliminary result)   Collection Time: 10/18/21 12:37 AM   Specimen: Right Antecubital; Blood  Result Value Ref Range Status   Specimen Description   Final    RIGHT ANTECUBITAL Performed at [facility], [place] [address] [place], [place] [address]    Special Requests   Final    BOTTLES DRAWN AEROBIC AND ANAEROBIC Blood Culture results may not be optimal due to an excessive volume of blood received in culture bottles Performed at [facility] [address], [place],  [address]    Culture   Final    NO GROWTH 3 DAYS Performed at [facility], [place]  [address] [place], [place] [address]    Report Status PENDING  Incomplete  Blood culture (routine x 2)     Status: None (Preliminary result)   Collection Time: 10/18/21 12:37 AM   Specimen: Left Antecubital; Blood  Result Value Ref Range Status   Specimen Description   Final    LEFT ANTECUBITAL Performed at [facility] [address] [place], [place] [address]    Special Requests   Final    BOTTLES DRAWN AEROBIC AND ANAEROBIC Blood Culture adequate volume Performed at [facility] [address] [place], [place] [address]    Culture   Final    NO GROWTH 3 DAYS Performed at [facility], [place] [address] [place],  [address]    Report Status PENDING  Incomplete  Resp Panel by RT-PCR (Flu A&B, Covid) Urine, Clean Catch     Status: None   Collection Time: 10/18/21  4:03 AM   Specimen: Urine, Clean Catch; Nasopharyngeal(NP) swabs in vial transport medium  Result Value Ref Range Status   SARS Coronavirus 2 by RT PCR NEGATIVE NEGATIVE Final    Comment: (NOTE) SARS-CoV-2 target nucleic acids  are NOT DETECTED.  The SARS-CoV-2 RNA is generally detectable in upper respiratory specimens during the acute phase of infection. The lowest concentration of SARS-CoV-2 viral copies this assay can detect is 138 copies/mL. A negative result does not preclude SARS-Cov-2 infection and should not be used as the sole basis for treatment or other patient management decisions. A negative result may occur with  improper specimen collection/handling, submission of specimen other than nasopharyngeal swab, presence of viral mutation(s) within the areas targeted by this assay, and inadequate number of viral copies(<138 copies/mL). A negative result must be combined with clinical observations, patient history, and epidemiological information. The expected result is Negative.  Fact Sheet for Patients:  EntrepreneurPulse.com.au  Fact Sheet for Healthcare Providers:  IncredibleEmployment.be  This test is no t yet approved or cleared by the [place] FDA and  has been authorized for detection and/or diagnosis of SARS-CoV-2 by FDA under an Emergency Use Authorization (EUA). This EUA will remain  in effect (meaning this test can be used) for the duration of the COVID-19 declaration under Section 564(b)(1) of the Act, 21 U.S.C.section 360bbb-3(b)(1), unless the authorization is terminated  or revoked sooner.       Influenza A by PCR NEGATIVE NEGATIVE Final   Influenza B by PCR NEGATIVE NEGATIVE Final    Comment: (NOTE) The Xpert Xpress SARS-CoV-2/FLU/RSV plus assay is intended as an aid in the diagnosis of influenza from Nasopharyngeal swab specimens and should not be used as a sole basis for treatment. Nasal washings and aspirates are unacceptable for Xpert Xpress SARS-CoV-2/FLU/RSV testing.  Fact Sheet for Patients: EntrepreneurPulse.com.au  Fact Sheet for Healthcare Providers: IncredibleEmployment.be  This test is  not yet approved or cleared by the [place] FDA and has been authorized for detection and/or diagnosis of SARS-CoV-2 by FDA under an Emergency Use Authorization (EUA). This EUA will remain in effect (meaning this test can be used) for the duration of the COVID-19 declaration under Section 564(b)(1) of the Act, 21 U.S.C. section 360bbb-3(b)(1), unless the authorization is terminated or revoked.  Performed at [facility], [place] [address], [place],  [address]   MRSA Next Gen by PCR, Nasal     Status: None   Collection Time: 10/18/21  3:21 PM   Specimen: Nasal Mucosa; Nasal Swab  Result Value Ref  Range Status   MRSA by PCR Next Gen NOT DETECTED NOT DETECTED Final    Comment: (NOTE) The GeneXpert MRSA Assay (FDA approved for NASAL specimens only), is one component of a comprehensive MRSA colonization surveillance program. It is not intended to diagnose MRSA infection nor to guide or monitor treatment for MRSA infections. Test performance is not FDA approved in patients less than 60 years old. Performed at [facility], [place] [address], [place], [place] [address]           Radiology Studies: No results found.      Scheduled Meds:  allopurinol  100 mg Oral Daily   apixaban  5 mg Oral BID   atorvastatin  80 mg Oral Daily   cloNIDine  0.1 mg Oral TID   colchicine  0.6 mg Oral Daily   finasteride  5 mg Oral Daily   gabapentin  300 mg Oral QHS   levothyroxine  50 mcg Oral QAC breakfast   mouth rinse  15 mL Mouth Rinse BID   metoprolol tartrate  100 mg Oral Daily   oxyCODONE  30 mg Oral BID   tamsulosin  0.4 mg Oral Daily   Continuous Infusions:  promethazine (PHENERGAN) injection (IM or IVPB) 12.5 mg (10/21/21 1323)     LOS: 3 days    Time spent: 38 min  Georgette Shell, MD  10/21/2021, 3:29 PM

## 2021-10-22 LAB — GLUCOSE, CAPILLARY
Glucose-Capillary: 148 mg/dL — ABNORMAL HIGH (ref 70–99)
Glucose-Capillary: 127 mg/dL — ABNORMAL HIGH (ref 70–99)
Glucose-Capillary: 124 mg/dL — ABNORMAL HIGH (ref 70–99)
Glucose-Capillary: 105 mg/dL — ABNORMAL HIGH (ref 70–99)
Glucose-Capillary: 178 mg/dL — ABNORMAL HIGH (ref 70–99)
Glucose-Capillary: 118 mg/dL — ABNORMAL HIGH (ref 70–99)

## 2021-10-22 MED ORDER — METOPROLOL TARTRATE 100 MG PO TABS: 50.0000 mg | ORAL_TABLET | Freq: Every day | ORAL | 2 refills | Status: DC

## 2021-10-22 MED ORDER — CLONIDINE HCL 0.1 MG PO TABS: 0.1000 mg | ORAL_TABLET | Freq: Three times a day (TID) | ORAL | 11 refills | Status: DC

## 2021-10-22 NOTE — TOC Progression Note (Signed)
Transition of Care [facility]) - Progression Note    Patient Details  Name: Thomas [name] MRN: [id number] Date of Birth: Jun 22, 1951  [facility]) CM/SW Contact  Purcell Mouton, RN Phone Number: 10/22/2021, 11:23 AM  Clinical Narrative:    Pt declined HHPT at present time.    Expected Discharge Plan: Home/Self Care Barriers to Discharge: No Barriers Identified  Expected Discharge Plan and Services Expected Discharge Plan: Home/Self Care       Living arrangements for the past 2 months: Apartment Expected Discharge Date: 10/22/21                                     Social Determinants of Health (SDOH) Interventions    Readmission Risk Interventions Readmission Risk Prevention Plan 09/20/2021 09/17/2019  Transportation Screening Complete Complete  PCP or Specialist Appt within 5-7 Days - Not Complete  Not Complete comments - snf vs home  PCP or Specialist Appt within 3-5 Days Complete -  Home Care Screening - Complete  Medication Review (RN CM) - Complete  HRI or Home Care Consult Complete -  Social Work Consult for [place] Planning/Counseling Complete -  Palliative Care Screening Not Applicable -  Medication Review Press photographer) Complete -  Some recent data might be hidden
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
## 2021-10-22 NOTE — Plan of Care (Signed)
Discharge instructions reviewed with patient, stressed the importance of no more Glipizide which patient verbalized understanding.  Transported via wheelchair to main entrance to be taken home by brother.
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
## 2021-10-22 NOTE — Discharge Instructions (Signed)
DO NOT TAKE GLIPIZIDE

## 2021-10-22 NOTE — TOC Progression Note (Signed)
Transition of Care [facility]) - Progression Note    Patient Details  Name: Thomas [name] MRN: [id number] Date of Birth: 1951-05-26  Transition of Care [facility]) CM/SW Contact  Purcell Mouton, RN Phone Number: 10/22/2021, 2:51 PM  Clinical Narrative:    Spoke with pt again concerning SNF. Pt declined. Explained to pt that this is an acute hospital. Once stable to discharge insurance  Could decline to pay and he would have a hospital bill. Pt states that he will see who he could stay with. Offered a list of homeless shelters. [facility] rep called to say pt needs more than HH. [place], pt refused [facility] as well.    Expected Discharge Plan: Home/Self Care Barriers to Discharge: No Barriers Identified  Expected Discharge Plan and Services Expected Discharge Plan: Home/Self Care       Living arrangements for the past 2 months: Apartment Expected Discharge Date: 10/22/21                                     Social Determinants of Health (SDOH) Interventions    Readmission Risk Interventions Readmission Risk Prevention Plan 09/20/2021 09/17/2019  Transportation Screening Complete Complete  PCP or Specialist Appt within 5-7 Days - Not Complete  Not Complete comments - snf vs home  PCP or Specialist Appt within 3-5 Days Complete -  Home Care Screening - Complete  Medication Review (RN CM) - Complete  HRI or Home Care Consult Complete -  Social Work Consult for [place] Planning/Counseling Complete -  Palliative Care Screening Not Applicable -  Medication Review Press photographer) Complete -  Some recent data might be hidden
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
## 2021-10-23 LAB — CULTURE, BLOOD (ROUTINE X 2)
Culture: NO GROWTH
Culture: NO GROWTH
Special Requests: ADEQUATE

## 2021-10-23 NOTE — Discharge Summary (Addendum)
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  His blood sugar was found to be in the 30s.  He was started on D10 drip.  Review of the chart indicates that patient was discharged on 10/15/2019 was admitted with hypoglycemia and he was told to stop sulfonylureas in the setting of however he continued to take it.  6 days prior to that admission he was admitted with AKI . He has been having multiple hospital admissions  Discharge Diagnoses:  Principal Problem:   Hypoglycemia   Pressure Injury 10/18/21 Buttocks Left Stage 1 -  Intact skin with non-blanchable redness of a localized area usually over a bony prominence. slightly pink (Active)  10/18/21 1615  Location: Buttocks  Location Orientation: Left  Staging: Stage 1 -  Intact skin with non-blanchable redness of a localized area usually over a bony prominence.  Wound Description (Comments): slightly pink  Present on Admission: Yes   #1 Hypoglycemia-resolved.  Drug induced.  He was treated with D10 and then D5 drip with stable blood sugars.  Will not discharge him on sulfonylurea.  Last hemoglobin A1c was 6.0 December 2022. 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   #6 history of gout ON allopurinol and colchicine.   #7 BPH restart Flomax   #8 hypothyroidism ON Synthroid   #9 chronic pain continue home oxycodone.   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   Weight as of this encounter: 126.8 kg.  Discharge Instructions  Discharge Instructions     Diet - low sodium heart healthy   Complete by: As directed    Discharge wound care:   Complete by: As directed    Place a piece of Xeroform dressing over the open area in the left lower extremity and secure with foam dressing or wrap with Kerlix.  Change daily.   Increase activity slowly   Complete by: As directed       Allergies as of 10/22/2021       Reactions   Other Other (See Comments)   Blood pressure issues Per [facility] hospital: Pt states he can only take these pain meds or else he gets very sick: Oxycontin,morphine,demerol, and dilaudid are the only pain meds pt states he can take.    Oxymorphone Other (See Comments)   Causes kidney problems   Aspirin Nausea And Vomiting   Per [facility]   Beta Vulgaris Nausea And Vomiting   Beets   Buspirone  Nausea And Vomiting, Other (See Comments)   Affected his head   Cabbage Nausea And Vomiting   Codeine Other (See Comments)   Unknown reaction   Dolobid [diflunisal] Other (See Comments)   Unknown reaction   Fish Allergy Nausea And Vomiting   Fish-derived Products Nausea And Vomiting   Methadone Other (See Comments)   Made him loopy   Pentazocine Other (See Comments)   Unknown reaction   Propoxyphene Nausea And Vomiting, Other (See Comments)   Messed  with his head   Shellfish Allergy Nausea And Vomiting   Sulfa Antibiotics Hives   Sulfasalazine Other (See Comments)   Unknown reaction   Vistaril [hydroxyzine] Nausea And Vomiting, Other (See Comments)   Didn't help   Amoxicillin Rash   Per [facility]        Medication List     STOP taking these medications    acetaminophen 500 MG tablet Commonly known as: TYLENOL   amLODipine 5 MG tablet Commonly known as: NORVASC   cloNIDine 0.3 MG tablet Commonly known as: CATAPRES   cloNIDine 0.3 mg/24hr patch Commonly known as: CATAPRES - Dosed in mg/24 hr   cyanocobalamin 100 MCG tablet   furosemide 40 MG tablet Commonly known as: LASIX   glipiZIDE 10 MG 24 hr tablet Commonly known as: GLUCOTROL XL   lactulose 10 GM/15ML solution Commonly known as: CHRONULAC   saccharomyces boulardii 250 MG capsule Commonly known as: FLORASTOR       TAKE these medications    allopurinol 100 MG tablet Commonly known as: ZYLOPRIM Take 100 mg by mouth daily. Notes to patient: 10/23/2021   apixaban 5 MG Tabs tablet Commonly known as: ELIQUIS Take 1 tablet by mouth 2 times daily. Notes to patient: 10/22/2021 evening    atorvastatin 80 MG tablet Commonly known as: LIPITOR Take 1 tablet (80 mg total) by mouth daily. Notes to patient: 10/23/2021    bisacodyl 10 MG suppository Commonly known as: DULCOLAX Place 10 mg rectally at bedtime. Notes to patient: 10/23/2021   colchicine 0.6 MG tablet Take 0.6 mg by mouth daily. Notes to patient: 10/23/2021    ferrous sulfate 325 (65 FE) MG tablet Take 1 tablet by mouth daily with breakfast. Notes to patient: 10/23/2021    finasteride 5 MG tablet Commonly known as: PROSCAR Take 1 tablet (5 mg total) by mouth daily. Notes to patient: 10/23/2021    gabapentin 300 MG capsule Commonly known as: NEURONTIN Take 1 capsule (300 mg total) by mouth at bedtime. What changed: when to take this Notes to patient: 10/22/2021    hydrALAZINE 100 MG  tablet Commonly known as: APRESOLINE Take 1 tablet by mouth every 8 hours. What changed:  when to take this reasons to take this   levothyroxine 50 MCG tablet Commonly known as: SYNTHROID Take 50 mcg by mouth daily before breakfast. Notes to patient: 10/23/2021    methocarbamol 750 MG tablet Commonly known as: ROBAXIN Take 1 tablet (750 mg total) by mouth every 8 (eight) hours as needed for muscle spasms.   metoprolol tartrate 100 MG tablet Commonly known as: LOPRESSOR Take 0.5 tablets (50 mg total) by mouth daily. What changed:  how much to take Another medication with the same name was removed. Continue taking this medication, and follow the directions you see here. Notes to patient: 10/23/2021    naloxegol oxalate 12.5 MG Tabs tablet Commonly known as: MOVANTIK Take 1 tablet by mouth daily. Notes to patient: 10/23/2021    OxyCONTIN 30 MG 12  hr tablet Generic drug: oxyCODONE Take 1 tablet (30 mg total) by mouth 2 (two) times daily. What changed: when to take this Notes to patient: 10/22/2021 evening dose    pantoprazole 40 MG tablet Commonly known as: PROTONIX Take 1 tablet by mouth 2 times daily. Notes to patient: 10/22/2021 bedtime    polyethylene glycol 17 g packet Commonly known as: MiraLax Take 17 g by mouth daily as needed. What changed: reasons to take this   tamsulosin 0.4 MG Caps capsule Commonly known as: FLOMAX Take 1 capsule (0.4 mg total) by mouth daily. Notes to patient: 10/23/2021                Discharge Care Instructions  (From admission, onward)           Start     Ordered   10/22/21 0000  Discharge wound care:       Comments: Place a piece of Xeroform dressing over the open area in the left lower extremity and secure with foam dressing or wrap with Kerlix.  Change daily.   10/22/21 1033            Allergies  Allergen Reactions   Other Other (See Comments)    Blood pressure issues Per [name] hospital: Pt states he can only take  these pain meds or else he gets very sick: Oxycontin,morphine,demerol, and dilaudid are the only pain meds pt states he can take.     Oxymorphone Other (See Comments)    Causes kidney problems   Aspirin Nausea And Vomiting    Per [facility]   Beta Vulgaris Nausea And Vomiting    Beets   Buspirone Nausea And Vomiting and Other (See Comments)    Affected his head   Cabbage Nausea And Vomiting   Codeine Other (See Comments)    Unknown reaction   Dolobid [Diflunisal] Other (See Comments)    Unknown reaction   Fish Allergy Nausea And Vomiting   Fish-Derived Products Nausea And Vomiting   Methadone Other (See Comments)    Made him loopy   Pentazocine Other (See Comments)    Unknown reaction   Propoxyphene Nausea And Vomiting and Other (See Comments)    Messed with his head   Shellfish Allergy Nausea And Vomiting   Sulfa Antibiotics Hives   Sulfasalazine Other (See Comments)    Unknown reaction   Vistaril [Hydroxyzine] Nausea And Vomiting and Other (See Comments)    Didn't help   Amoxicillin Rash    Per [facility]    Consultations: none   Procedures/Studies: CT ABDOMEN PELVIS WO CONTRAST  Result Date: 09/23/2021 CLINICAL DATA:  Abdominal distension. EXAM: CT ABDOMEN AND PELVIS WITHOUT CONTRAST TECHNIQUE: Multidetector CT imaging of the abdomen and pelvis was performed following the standard protocol without IV contrast. COMPARISON:  September 17, 2021. FINDINGS: Lower chest: Small bilateral pleural effusions are noted. Stable left lower lobe opacity is noted concerning for pneumonia. Hepatobiliary: No focal liver abnormality is seen. No gallstones, gallbladder wall thickening, or biliary dilatation. Pancreas: Unremarkable. No pancreatic ductal dilatation or surrounding inflammatory changes. Spleen: Normal in size without focal abnormality. Adrenals/Urinary Tract: Adrenal glands appear normal. Moderate to severe right hydroureteronephrosis is noted without evidence of  obstructing calculus. Moderate right renal cortical atrophy is noted. Transition zone is seen in the distal right ureter in the pelvis concerning for stricture. Left kidney is unremarkable. Mild urinary bladder distention is noted. Stomach/Bowel: Stomach appears normal. There is no evidence of bowel obstruction or inflammation. Vascular/Lymphatic: Aortic  atherosclerosis. No enlarged abdominal or pelvic lymph nodes. IVC filter is noted in infrarenal position. Reproductive: Prostate is unremarkable. Other: No abdominal wall hernia or abnormality. No abdominopelvic ascites. Musculoskeletal: No acute or significant osseous findings. IMPRESSION: Small bilateral pleural effusions are noted. Stable left lower lobe opacity is noted concerning for pneumonia. Moderate to severe right hydroureteronephrosis is noted without evidence of obstructing calculus which is increased compared to prior exam. Moderate right renal cortical atrophy is noted suggesting longstanding obstruction. Transition zone is seen in the distal right ureter in the pelvis concerning for stricture. Aortic Atherosclerosis (ICD10-I70.0). Electronically Signed   By: Marijo Conception M.D.   On: 09/23/2021 12:27   DG Chest 2 View  Result Date: 10/18/2021 CLINICAL DATA:  Hypoglycemia EXAM: CHEST - 2 VIEW COMPARISON:  10/07/2021 FINDINGS: Persistent bilateral interstitial pulmonary edema, unchanged. Mild cardiomegaly. No pleural effusion. IMPRESSION: Unchanged cardiomegaly and interstitial pulmonary edema. Electronically Signed   By: Ulyses Jarred M.D.   On: 10/18/2021 00:26   DG Chest 2 View  Result Date: 10/07/2021 CLINICAL DATA:  Weakness EXAM: CHEST - 2 VIEW COMPARISON:  09/23/2021 FINDINGS: Stable cardiomegaly. Prior median sternotomy. Pulmonary vascular congestion with diffuse bilateral interstitial prominence. No large pleural fluid collection. No pneumothorax. IMPRESSION: Findings suggestive of CHF with interstitial edema. Electronically Signed    By: Davina Poke D.O.   On: 10/07/2021 14:19   DG Abd 1 View  Result Date: 09/23/2021 CLINICAL DATA:  Intubation. EXAM: PORTABLE CHEST 1 VIEW COMPARISON:  Chest radiograph dated 09/16/2021. FINDINGS: Endotracheal tube with tip approximately 3.5 cm above the carina. Enteric tube extends below the diaphragm with tip and side-port in the left upper abdomen likely in the proximal stomach. Diffuse interstitial coarsening and streaky densities which may represent chronic changes. Interval improvement of the previously seen left lung base density. Residual infiltrate is not excluded no pleural effusion pneumothorax. Stable cardiomegaly. Median sternotomy wires. Atherosclerotic calcification of the aorta. Nonspecific bowel gas pattern. Air is noted within the colon. An IVC filter is noted. Osteopenia with degenerative changes of the spine and spinal fusion hardware. No acute osseous pathology. IMPRESSION: Endotracheal tube with tip above the carina. Enteric tube extends below the diaphragm with tip and side-port in the proximal stomach. Electronically Signed   By: Anner Crete M.D.   On: 09/23/2021 19:17   CT Head Wo Contrast  Result Date: 10/07/2021 CLINICAL DATA:  Altered mental status. EXAM: CT HEAD WITHOUT CONTRAST TECHNIQUE: Contiguous axial images were obtained from the base of the skull through the vertex without intravenous contrast. COMPARISON:  September 23, 2021. FINDINGS: Brain: No evidence of acute infarction, hemorrhage, hydrocephalus, extra-axial collection or mass lesion/mass effect. Vascular: No hyperdense vessel or unexpected calcification. Skull: Normal. Negative for fracture or focal lesion. Sinuses/Orbits: No acute finding. Other: None. IMPRESSION: No acute intracranial abnormality seen. Electronically Signed   By: Marijo Conception M.D.   On: 10/07/2021 14:27   CT HEAD WO CONTRAST (5MM)  Result Date: 09/23/2021 CLINICAL DATA:  Delirium EXAM: CT HEAD WITHOUT CONTRAST TECHNIQUE:  Contiguous axial images were obtained from the base of the skull through the vertex without intravenous contrast. COMPARISON:  CT head August 24, 2021. FINDINGS: Motion limited study.  Within this limitation: Brain: New hypodensity in the left occipital lobe (series 3, image 12; series 5, image 40). No acute hemorrhage a mass lesion, midline shift, or extra-axial fluid collection. Similar small hypodensity in the right basal ganglia, likely a remote lacunar infarct or dilated perivascular space. Similar atrophy.  Vascular: No obvious hyperdense vessel with limited evaluation. Skull: No acute fracture. Sinuses/Orbits: Clear sinuses.  Unremarkable orbits. Other: No mastoid effusions IMPRESSION: Motion limited study with new hypoattenuation in the left occipital lobe. While this could represent artifact, acute infarct is not excluded. If the patient is able, MRI could provide more sensitive evaluation for acute infarct. Electronically Signed   By: Margaretha Sheffield M.D.   On: 09/23/2021 12:31   US RENAL  Result Date: 10/08/2021 CLINICAL DATA:  AK I EXAM: RENAL / URINARY TRACT ULTRASOUND COMPLETE COMPARISON:  September 30, 2021 FINDINGS: Right Kidney: Renal measurements: 13.9 x 5.9 x 7.0 cm = volume: 298 mL. Severe right hydronephrosis and cortical thinning. Left Kidney: Renal measurements: 12.8 x 6 1 x 6.9 cm = volume: 281 mL. Echogenicity within normal limits. No mass or hydronephrosis visualized. Bladder: Appears normal for degree of bladder distention. Other: None. IMPRESSION: Severe right hydronephrosis and cortical thinning, unchanged compared to prior. Electronically Signed   By: Yetta Glassman M.D.   On: 10/08/2021 12:35   US RENAL  Result Date: 09/30/2021 CLINICAL DATA:  Hydronephrosis EXAM: RENAL / URINARY TRACT ULTRASOUND COMPLETE COMPARISON:  None. FINDINGS: Right Kidney: Renal measurements: 11.2 x 5.2 x 6.0 cm = volume: 182 mL. Echogenicity within normal limits. Severe hydronephrosis and proximal  hydroureter. No calculi or other obstruction identified. Left Kidney: Renal measurements: 13.1 x 6.3 x 6.7 cm = volume: 288 mL. Echogenicity within normal limits. No mass or hydronephrosis visualized. Bladder: Decompressed by Foley catheter. Other: None. IMPRESSION: 1. Severe right hydronephrosis and proximal hydroureter, as seen by prior CT. No calculi or other obstruction identified. 2. No left hydronephrosis. 3. Bladder decompressed by Foley catheter. Electronically Signed   By: Delanna Ahmadi M.D.   On: 09/30/2021 16:49   Portable Chest x-ray  Result Date: 09/23/2021 CLINICAL DATA:  Intubation. EXAM: PORTABLE CHEST 1 VIEW COMPARISON:  Chest radiograph dated 09/16/2021. FINDINGS: Endotracheal tube with tip approximately 3.5 cm above the carina. Enteric tube extends below the diaphragm with tip and side-port in the left upper abdomen likely in the proximal stomach. Diffuse interstitial coarsening and streaky densities which may represent chronic changes. Interval improvement of the previously seen left lung base density. Residual infiltrate is not excluded no pleural effusion pneumothorax. Stable cardiomegaly. Median sternotomy wires. Atherosclerotic calcification of the aorta. Nonspecific bowel gas pattern. Air is noted within the colon. An IVC filter is noted. Osteopenia with degenerative changes of the spine and spinal fusion hardware. No acute osseous pathology. IMPRESSION: Endotracheal tube with tip above the carina. Enteric tube extends below the diaphragm with tip and side-port in the proximal stomach. Electronically Signed   By: Anner Crete M.D.   On: 09/23/2021 19:17   ECHOCARDIOGRAM COMPLETE  Result Date: 09/24/2021    ECHOCARDIOGRAM REPORT   Patient Name:   LOXLEY SCHMALE Date of Exam: 09/24/2021 Medical Rec #:  [id number]       Height:       78.0 in Accession #:    [id number]      Weight:       261.5 lb Date of Birth:  1951/02/24       BSA:          2.530 m Patient Age:    54 years         BP:           172/105 mmHg Patient Gender: M               HR:  94 bpm. Exam Location:  Inpatient Procedure: 2D Echo, Color Doppler and Cardiac Doppler Indications:    R06.9 DOE  History:        Patient has prior history of Echocardiogram examinations, most                 recent 06/17/2021. CHF; Risk Factors:Hypertension and Diabetes.                 Prior performed at [facility].  Sonographer:    [place] Referring Phys: [place] Comments: See sonographer comments IMPRESSIONS  1. Left ventricular ejection fraction, by estimation, is 35 to 40%. The left ventricle has moderately decreased function. The left ventricle demonstrates global hypokinesis. There is moderate concentric left ventricular hypertrophy. Left ventricular diastolic parameters are indeterminate.  2. Right ventricular systolic function is normal. The right ventricular size is moderately enlarged. There is severely elevated pulmonary artery systolic pressure.  3. Left atrial size was moderately dilated.  4. Right atrial size was mildly dilated.  5. The mitral valve is abnormal. Moderate mitral valve regurgitation.  6. Tricuspid valve regurgitation is mild to moderate.  7. The aortic valve is tricuspid. There is mild calcification of the aortic valve. Aortic valve regurgitation is not visualized.  8. Aortic dilatation noted. There is mild dilatation of the ascending aorta, measuring 42 mm.  9. The inferior vena cava is dilated in size with >50% respiratory variability, suggesting right atrial pressure of 8 mmHg. Comparison(s): No prior Echocardiogram. FINDINGS  Left Ventricle: Left ventricular ejection fraction, by estimation, is 35 to 40%. The left ventricle has moderately decreased function. The left ventricle demonstrates global hypokinesis. The left ventricular internal cavity size was normal in size. There is moderate concentric left ventricular hypertrophy. Left ventricular diastolic parameters  are indeterminate. Right Ventricle: The right ventricular size is moderately enlarged. No increase in right ventricular wall thickness. Right ventricular systolic function is normal. There is severely elevated pulmonary artery systolic pressure. The tricuspid regurgitant velocity is 3.73 m/s, and with an assumed right atrial pressure of 8 mmHg, the estimated right ventricular systolic pressure is 01.6 mmHg. Left Atrium: Left atrial size was moderately dilated. Right Atrium: Right atrial size was mildly dilated. Pericardium: There is no evidence of pericardial effusion. Mitral Valve: The mitral valve is abnormal. Moderate mitral valve regurgitation. Tricuspid Valve: The tricuspid valve is normal in structure. Tricuspid valve regurgitation is mild to moderate. No evidence of tricuspid stenosis. Aortic Valve: The aortic valve is tricuspid. There is mild calcification of the aortic valve. Aortic valve regurgitation is not visualized. Pulmonic Valve: The pulmonic valve was not well visualized. Pulmonic valve regurgitation is not visualized. No evidence of pulmonic stenosis. Aorta: Aortic dilatation noted. There is mild dilatation of the ascending aorta, measuring 42 mm. Venous: The inferior vena cava is dilated in size with greater than 50% respiratory variability, suggesting right atrial pressure of 8 mmHg. IAS/Shunts: The atrial septum is grossly normal.  LEFT VENTRICLE PLAX 2D LVIDd:         5.00 cm LVIDs:         3.90 cm LV PW:         1.40 cm LV IVS:        1.40 cm LVOT diam:     2.20 cm LV SV:         52 LV SV Index:   20 LVOT Area:     3.80 cm  LV Volumes (MOD) LV vol d, MOD  A2C: 141.0 ml LV vol d, MOD A4C: 165.0 ml LV vol s, MOD A2C: 94.4 ml LV vol s, MOD A4C: 101.0 ml LV SV MOD A2C:     46.6 ml LV SV MOD A4C:     165.0 ml LV SV MOD BP:      55.4 ml RIGHT VENTRICLE RV S prime:     11.60 cm/s TAPSE (M-mode): 1.7 cm LEFT ATRIUM              Index        RIGHT ATRIUM           Index LA diam:        4.30 cm  1.70  cm/m   RA Area:     25.20 cm LA Vol (A2C):   106.0 ml 41.90 ml/m  RA Volume:   88.50 ml  34.98 ml/m LA Vol (A4C):   117.0 ml 46.25 ml/m LA Biplane Vol: 115.0 ml 45.46 ml/m  AORTIC VALVE LVOT Vmax:   69.20 cm/s LVOT Vmean:  52.300 cm/s LVOT VTI:    0.136 m  AORTA Ao Root diam: 3.60 cm Ao Asc diam:  4.20 cm TRICUSPID VALVE TR Peak grad:   55.7 mmHg TR Vmax:        373.00 cm/s  SHUNTS Systemic VTI:  0.14 m Systemic Diam: 2.20 cm Rudean Haskell MD Electronically signed by Rudean Haskell MD Signature Date/Time: 09/24/2021/4:12:44 PM    Final    (Echo, Carotid, EGD, Colonoscopy, ERCP)    Subjective: No new c/o  Discharge Exam: Vitals:   10/22/21 0936 10/22/21 1436  BP:  (!) 185/92  Pulse: 72 66  Resp:  20  Temp:  98.6 F (37 C)  SpO2:  98%   Vitals:   10/21/21 2003 10/22/21 0551 10/22/21 0936 10/22/21 1436  BP: (!) 150/85 (!) 161/87  (!) 185/92  Pulse: (!) 59 (!) 59 72 66  Resp: 18 18  20   Temp: 98.6 F (37 C)   98.6 F (37 C)  TempSrc: Oral   Oral  SpO2: 95% 91%  98%  Weight:      Height:        General: Pt is alert, awake, not in acute distress Cardiovascular: RRR, S1/S2 +, no rubs, no gallops Respiratory: CTA bilaterally, no wheezing, no rhonchi Abdominal: Soft, NT, ND, bowel sounds + Extremities: right bka   The results of significant diagnostics from this hospitalization (including imaging, microbiology, ancillary and laboratory) are listed below for reference.     Microbiology: Recent Results (from the past 240 hour(s))  Blood culture (routine x 2)     Status: None   Collection Time: 10/18/21 12:37 AM   Specimen: Right Antecubital; Blood  Result Value Ref Range Status   Specimen Description   Final    RIGHT ANTECUBITAL Performed at [facility] [address] [place], [place] [address]    Special Requests   Final    BOTTLES DRAWN AEROBIC AND ANAEROBIC Blood Culture results may not be optimal due to an excessive volume of blood  received in culture bottles Performed at [facility] [address] [place], [place] [address]    Culture   Final    NO GROWTH 5 DAYS Performed at [facility], [place] [address], [place], [place] [address]    Report Status 10/23/2021 FINAL  Final  Blood culture (routine x 2)     Status: None   Collection Time: 10/18/21 12:37 AM  Specimen: Left Antecubital; Blood  Result Value Ref Range Status   Specimen Description   Final    LEFT ANTECUBITAL Performed at [facility] [address], [place], [place] [address]    Special Requests   Final    BOTTLES DRAWN AEROBIC AND ANAEROBIC Blood Culture adequate volume Performed at [facility] [address], [place], [place] [address]    Culture   Final    NO GROWTH 5 DAYS Performed at [facility], [place] [address], [place], [place] [address]    Report Status 10/23/2021 FINAL  Final  Resp Panel by RT-PCR (Flu A&B, Covid) Urine, Clean Catch     Status: None   Collection Time: 10/18/21  4:03 AM   Specimen: Urine, Clean Catch; Nasopharyngeal(NP) swabs in vial transport medium  Result Value Ref Range Status   SARS Coronavirus 2 by RT PCR NEGATIVE NEGATIVE Final    Comment: (NOTE) SARS-CoV-2 target nucleic acids are NOT DETECTED.  The SARS-CoV-2 RNA is generally detectable in upper respiratory specimens during the acute phase of infection. The lowest concentration of SARS-CoV-2 viral copies this assay can detect is 138 copies/mL. A negative result does not preclude SARS-Cov-2 infection and should not be used as the sole basis for treatment or other patient management decisions. A negative result may occur with  improper specimen collection/handling, submission of specimen other than nasopharyngeal swab, presence of viral mutation(s) within the areas targeted by this assay, and inadequate number of viral copies(<138 copies/mL). A negative result must be combined  with clinical observations, patient history, and epidemiological information. The expected result is Negative.  Fact Sheet for Patients:  EntrepreneurPulse.com.au  Fact Sheet for Healthcare Providers:  IncredibleEmployment.be  This test is no t yet approved or cleared by the [place] FDA and  has been authorized for detection and/or diagnosis of SARS-CoV-2 by FDA under an Emergency Use Authorization (EUA). This EUA will remain  in effect (meaning this test can be used) for the duration of the COVID-19 declaration under Section 564(b)(1) of the Act, 21 U.S.C.section 360bbb-3(b)(1), unless the authorization is terminated  or revoked sooner.       Influenza A by PCR NEGATIVE NEGATIVE Final   Influenza B by PCR NEGATIVE NEGATIVE Final    Comment: (NOTE) The Xpert Xpress SARS-CoV-2/FLU/RSV plus assay is intended as an aid in the diagnosis of influenza from Nasopharyngeal swab specimens and should not be used as a sole basis for treatment. Nasal washings and aspirates are unacceptable for Xpert Xpress SARS-CoV-2/FLU/RSV testing.  Fact Sheet for Patients: EntrepreneurPulse.com.au  Fact Sheet for Healthcare Providers: IncredibleEmployment.be  This test is not yet approved or cleared by the [place] FDA and has been authorized for detection and/or diagnosis of SARS-CoV-2 by FDA under an Emergency Use Authorization (EUA). This EUA will remain in effect (meaning this test can be used) for the duration of the COVID-19 declaration under Section 564(b)(1) of the Act, 21 U.S.C. section 360bbb-3(b)(1), unless the authorization is terminated or revoked.  Performed at [facility], [place] [address], [place], [place] [address]   MRSA Next Gen by PCR, Nasal     Status: None   Collection Time: 10/18/21  3:21 PM   Specimen: Nasal Mucosa; Nasal Swab  Result Value Ref Range Status   MRSA by PCR  Next Gen NOT DETECTED NOT DETECTED Final    Comment: (NOTE) The GeneXpert MRSA Assay (FDA approved for NASAL specimens only), is one component of a comprehensive  MRSA colonization surveillance program. It is not intended to diagnose MRSA infection nor to guide or monitor treatment for MRSA infections. Test performance is not FDA approved in patients less than 7 years old. Performed at [facility], [place] [address], [place], [place] [address]      Labs: BNP (last 3 results) Recent Labs    09/16/21 2245 10/08/21 0745  BNP 97.5 622.6*   Basic Metabolic Panel: Recent Labs  Lab 10/18/21 0037 10/18/21 0607 10/19/21 0244 10/21/21 0848  NA 136 134* 135 137  K 5.6* 4.6 4.8 4.6  CL 103 103 104 105  CO2 20* 24 24 23   GLUCOSE 44* 101* 130* 114*  BUN 65* 64* 53* 41*  CREATININE 4.43* 4.33* 3.59* 2.73*  CALCIUM 7.2* 6.8* 7.2* 8.1*  MG  --  1.9  --   --    Liver Function Tests: Recent Labs  Lab 10/18/21 0037 10/18/21 0607 10/19/21 0244 10/21/21 0848  AST 32 22 22 20   ALT 9 9 10 10   ALKPHOS 75 68 63 74  BILITOT 0.9 0.6 0.5 0.6  PROT 8.1 7.2 6.9 7.6  ALBUMIN 3.5 2.9* 2.9* 3.1*   No results for input(s): LIPASE, AMYLASE in the last 168 hours. No results for input(s): AMMONIA in the last 168 hours. CBC: Recent Labs  Lab 10/18/21 0037 10/18/21 0607 10/19/21 0244 10/21/21 0848  WBC 8.0 5.8 6.0 6.8  HGB 8.8* 7.7* 7.6* 8.9*  HCT 28.2* 24.5* 23.8* 27.4*  MCV 102.5* 100.8* 97.9 96.1  PLT 148* 118* 129* 160   Cardiac Enzymes: No results for input(s): CKTOTAL, CKMB, CKMBINDEX, TROPONINI in the last 168 hours. BNP: Invalid input(s): POCBNP CBG: Recent Labs  Lab 10/22/21 0354 10/22/21 0548 10/22/21 0736 10/22/21 1155 10/22/21 1644  GLUCAP 127* 124* 105* 178* 118*   D-Dimer No results for input(s): DDIMER in the last 72 hours. Hgb A1c No results for input(s): HGBA1C in the last 72 hours. Lipid Profile No results for input(s): CHOL, HDL,  LDLCALC, TRIG, CHOLHDL, LDLDIRECT in the last 72 hours. Thyroid function studies No results for input(s): TSH, T4TOTAL, T3FREE, THYROIDAB in the last 72 hours.  Invalid input(s): FREET3 Anemia work up No results for input(s): VITAMINB12, FOLATE, FERRITIN, TIBC, IRON, RETICCTPCT in the last 72 hours. Urinalysis    Component Value Date/Time   COLORURINE YELLOW 10/18/2021 0037   APPEARANCEUR HAZY (A) 10/18/2021 0037   LABSPEC 1.010 10/18/2021 0037   PHURINE 5.0 10/18/2021 0037   GLUCOSEU NEGATIVE 10/18/2021 0037   HGBUR SMALL (A) 10/18/2021 0037   BILIRUBINUR NEGATIVE 10/18/2021 0037   KETONESUR NEGATIVE 10/18/2021 0037   PROTEINUR 100 (A) 10/18/2021 0037   NITRITE NEGATIVE 10/18/2021 0037   LEUKOCYTESUR LARGE (A) 10/18/2021 0037   Sepsis Labs Invalid input(s): PROCALCITONIN,  WBC,  LACTICIDVEN Microbiology Recent Results (from the past 240 hour(s))  Blood culture (routine x 2)     Status: None   Collection Time: 10/18/21 12:37 AM   Specimen: Right Antecubital; Blood  Result Value Ref Range Status   Specimen Description   Final    RIGHT ANTECUBITAL Performed at [facility], [place] [address] [place], [place] [address]    Special Requests   Final    BOTTLES DRAWN AEROBIC AND ANAEROBIC Blood Culture results may not be optimal due to an excessive volume of blood received in culture bottles Performed at [place] [address] [place], [place] [address]    Culture   Final    NO  GROWTH 5 DAYS Performed at [facility], [place] [address], [place], [place] [address]    Report Status 10/23/2021 FINAL  Final  Blood culture (routine x 2)     Status: None   Collection Time: 10/18/21 12:37 AM   Specimen: Left Antecubital; Blood  Result Value Ref Range Status   Specimen Description   Final    LEFT ANTECUBITAL Performed at [place] [address], [place], [place] [address]    Special Requests   Final    BOTTLES  DRAWN AEROBIC AND ANAEROBIC Blood Culture adequate volume Performed at [place] [address], [place], [place] [address]    Culture   Final    NO GROWTH 5 DAYS Performed at [facility], [place] [address], [place], [place] [address]    Report Status 10/23/2021 FINAL  Final  Resp Panel by RT-PCR (Flu A&B, Covid) Urine, Clean Catch     Status: None   Collection Time: 10/18/21  4:03 AM   Specimen: Urine, Clean Catch; Nasopharyngeal(NP) swabs in vial transport medium  Result Value Ref Range Status   SARS Coronavirus 2 by RT PCR NEGATIVE NEGATIVE Final    Comment: (NOTE) SARS-CoV-2 target nucleic acids are NOT DETECTED.  The SARS-CoV-2 RNA is generally detectable in upper respiratory specimens during the acute phase of infection. The lowest concentration of SARS-CoV-2 viral copies this assay can detect is 138 copies/mL. A negative result does not preclude SARS-Cov-2 infection and should not be used as the sole basis for treatment or other patient management decisions. A negative result may occur with  improper specimen collection/handling, submission of specimen other than nasopharyngeal swab, presence of viral mutation(s) within the areas targeted by this assay, and inadequate number of viral copies(<138 copies/mL). A negative result must be combined with clinical observations, patient history, and epidemiological information. The expected result is Negative.  Fact Sheet for Patients:  EntrepreneurPulse.com.au  Fact Sheet for Healthcare Providers:  IncredibleEmployment.be  This test is no t yet approved or cleared by the [place] FDA and  has been authorized for detection and/or diagnosis of SARS-CoV-2 by FDA under an Emergency Use Authorization (EUA). This EUA will remain  in effect (meaning this test can be used) for the duration of the COVID-19 declaration under Section 564(b)(1) of the Act, 21 U.S.C.section  360bbb-3(b)(1), unless the authorization is terminated  or revoked sooner.       Influenza A by PCR NEGATIVE NEGATIVE Final   Influenza B by PCR NEGATIVE NEGATIVE Final    Comment: (NOTE) The Xpert Xpress SARS-CoV-2/FLU/RSV plus assay is intended as an aid in the diagnosis of influenza from Nasopharyngeal swab specimens and should not be used as a sole basis for treatment. Nasal washings and aspirates are unacceptable for Xpert Xpress SARS-CoV-2/FLU/RSV testing.  Fact Sheet for Patients: EntrepreneurPulse.com.au  Fact Sheet for Healthcare Providers: IncredibleEmployment.be  This test is not yet approved or cleared by the [place] FDA and has been authorized for detection and/or diagnosis of SARS-CoV-2 by FDA under an Emergency Use Authorization (EUA). This EUA will remain in effect (meaning this test can be used) for the duration of the COVID-19 declaration under Section 564(b)(1) of the Act, 21 U.S.C. section 360bbb-3(b)(1), unless the authorization is terminated or revoked.  Performed at [facility], [place] [address], [place], [place] [address]   MRSA Next Gen by PCR, Nasal     Status: None   Collection Time: 10/18/21  3:21 PM  Specimen: Nasal Mucosa; Nasal Swab  Result Value Ref Range Status   MRSA by PCR Next Gen NOT DETECTED NOT DETECTED Final    Comment: (NOTE) The GeneXpert MRSA Assay (FDA approved for NASAL specimens only), is one component of a comprehensive MRSA colonization surveillance program. It is not intended to diagnose MRSA infection nor to guide or monitor treatment for MRSA infections. Test performance is not FDA approved in patients less than 26 years old. Performed at [facility], [place] [address], [place], [place] [address]      Time coordinating discharge: 39 minutes  SIGNED   Georgette Shell, MD  [facility] 10/23/2021, 9:55 AM

## 2021-12-07 ENCOUNTER — Inpatient Hospital Stay (HOSPITAL_COMMUNITY)
Admission: EM | Admit: 2021-12-07 | Discharge: 2021-12-16 | DRG: 602 | Disposition: A | Discharge status: Home Health | Payer: Medicare Other | Attending: Internal Medicine | Admitting: Internal Medicine

## 2021-12-07 ENCOUNTER — Encounter (HOSPITAL_COMMUNITY): Payer: Self-pay

## 2021-12-07 ENCOUNTER — Other Ambulatory Visit: Payer: Self-pay

## 2021-12-07 ENCOUNTER — Emergency Department (HOSPITAL_COMMUNITY): Payer: Medicare Other

## 2021-12-07 DIAGNOSIS — Z89511 Acquired absence of right leg below knee: Secondary | ICD-10-CM

## 2021-12-07 DIAGNOSIS — N184 Chronic kidney disease, stage 4 (severe): Secondary | ICD-10-CM | POA: Diagnosis present

## 2021-12-07 DIAGNOSIS — Z885 Allergy status to narcotic agent status: Secondary | ICD-10-CM

## 2021-12-07 DIAGNOSIS — I130 Hypertensive heart and chronic kidney disease with heart failure and stage 1 through stage 4 chronic kidney disease, or unspecified chronic kidney disease: Secondary | ICD-10-CM | POA: Diagnosis present

## 2021-12-07 DIAGNOSIS — Z79890 Hormone replacement therapy (postmenopausal): Secondary | ICD-10-CM | POA: Diagnosis not present

## 2021-12-07 DIAGNOSIS — L0390 Cellulitis, unspecified: Secondary | ICD-10-CM

## 2021-12-07 DIAGNOSIS — Z91013 Allergy to seafood: Secondary | ICD-10-CM | POA: Diagnosis not present

## 2021-12-07 DIAGNOSIS — R609 Edema, unspecified: Secondary | ICD-10-CM | POA: Diagnosis not present

## 2021-12-07 DIAGNOSIS — N400 Enlarged prostate without lower urinary tract symptoms: Secondary | ICD-10-CM | POA: Diagnosis present

## 2021-12-07 DIAGNOSIS — I471 Supraventricular tachycardia, unspecified: Secondary | ICD-10-CM

## 2021-12-07 DIAGNOSIS — Z20822 Contact with and (suspected) exposure to covid-19: Secondary | ICD-10-CM | POA: Diagnosis present

## 2021-12-07 DIAGNOSIS — E039 Hypothyroidism, unspecified: Secondary | ICD-10-CM | POA: Diagnosis present

## 2021-12-07 DIAGNOSIS — Z7901 Long term (current) use of anticoagulants: Secondary | ICD-10-CM | POA: Diagnosis not present

## 2021-12-07 DIAGNOSIS — Z79899 Other long term (current) drug therapy: Secondary | ICD-10-CM

## 2021-12-07 DIAGNOSIS — R5381 Other malaise: Secondary | ICD-10-CM | POA: Diagnosis present

## 2021-12-07 DIAGNOSIS — E1122 Type 2 diabetes mellitus with diabetic chronic kidney disease: Secondary | ICD-10-CM | POA: Diagnosis present

## 2021-12-07 DIAGNOSIS — G894 Chronic pain syndrome: Secondary | ICD-10-CM | POA: Diagnosis present

## 2021-12-07 DIAGNOSIS — M109 Gout, unspecified: Secondary | ICD-10-CM | POA: Diagnosis present

## 2021-12-07 DIAGNOSIS — K219 Gastro-esophageal reflux disease without esophagitis: Secondary | ICD-10-CM | POA: Diagnosis present

## 2021-12-07 DIAGNOSIS — M1990 Unspecified osteoarthritis, unspecified site: Secondary | ICD-10-CM | POA: Diagnosis present

## 2021-12-07 DIAGNOSIS — L538 Other specified erythematous conditions: Secondary | ICD-10-CM | POA: Diagnosis not present

## 2021-12-07 DIAGNOSIS — S81802D Unspecified open wound, left lower leg, subsequent encounter: Secondary | ICD-10-CM | POA: Diagnosis not present

## 2021-12-07 DIAGNOSIS — N179 Acute kidney failure, unspecified: Secondary | ICD-10-CM | POA: Diagnosis not present

## 2021-12-07 DIAGNOSIS — I82412 Acute embolism and thrombosis of left femoral vein: Secondary | ICD-10-CM | POA: Diagnosis not present

## 2021-12-07 DIAGNOSIS — Z886 Allergy status to analgesic agent status: Secondary | ICD-10-CM | POA: Diagnosis not present

## 2021-12-07 DIAGNOSIS — Z882 Allergy status to sulfonamides status: Secondary | ICD-10-CM

## 2021-12-07 DIAGNOSIS — Z86718 Personal history of other venous thrombosis and embolism: Secondary | ICD-10-CM | POA: Diagnosis not present

## 2021-12-07 DIAGNOSIS — I825Z2 Chronic embolism and thrombosis of unspecified deep veins of left distal lower extremity: Secondary | ICD-10-CM

## 2021-12-07 DIAGNOSIS — E875 Hyperkalemia: Secondary | ICD-10-CM | POA: Diagnosis not present

## 2021-12-07 DIAGNOSIS — Z91018 Allergy to other foods: Secondary | ICD-10-CM

## 2021-12-07 DIAGNOSIS — Z6832 Body mass index (BMI) 32.0-32.9, adult: Secondary | ICD-10-CM | POA: Diagnosis not present

## 2021-12-07 DIAGNOSIS — E119 Type 2 diabetes mellitus without complications: Secondary | ICD-10-CM

## 2021-12-07 DIAGNOSIS — E11649 Type 2 diabetes mellitus with hypoglycemia without coma: Secondary | ICD-10-CM | POA: Diagnosis present

## 2021-12-07 DIAGNOSIS — I739 Peripheral vascular disease, unspecified: Secondary | ICD-10-CM | POA: Diagnosis not present

## 2021-12-07 DIAGNOSIS — Z86711 Personal history of pulmonary embolism: Secondary | ICD-10-CM

## 2021-12-07 DIAGNOSIS — Z880 Allergy status to penicillin: Secondary | ICD-10-CM

## 2021-12-07 DIAGNOSIS — I82402 Acute embolism and thrombosis of unspecified deep veins of left lower extremity: Secondary | ICD-10-CM | POA: Diagnosis present

## 2021-12-07 DIAGNOSIS — I5042 Chronic combined systolic (congestive) and diastolic (congestive) heart failure: Secondary | ICD-10-CM | POA: Diagnosis present

## 2021-12-07 DIAGNOSIS — E669 Obesity, unspecified: Secondary | ICD-10-CM | POA: Diagnosis present

## 2021-12-07 DIAGNOSIS — I82512 Chronic embolism and thrombosis of left femoral vein: Secondary | ICD-10-CM | POA: Diagnosis present

## 2021-12-07 DIAGNOSIS — N170 Acute kidney failure with tubular necrosis: Secondary | ICD-10-CM | POA: Diagnosis present

## 2021-12-07 DIAGNOSIS — Z8249 Family history of ischemic heart disease and other diseases of the circulatory system: Secondary | ICD-10-CM

## 2021-12-07 DIAGNOSIS — I82409 Acute embolism and thrombosis of unspecified deep veins of unspecified lower extremity: Secondary | ICD-10-CM | POA: Diagnosis present

## 2021-12-07 DIAGNOSIS — Z809 Family history of malignant neoplasm, unspecified: Secondary | ICD-10-CM

## 2021-12-07 DIAGNOSIS — E785 Hyperlipidemia, unspecified: Secondary | ICD-10-CM | POA: Diagnosis present

## 2021-12-07 DIAGNOSIS — I10 Essential (primary) hypertension: Secondary | ICD-10-CM | POA: Diagnosis present

## 2021-12-07 DIAGNOSIS — D631 Anemia in chronic kidney disease: Secondary | ICD-10-CM | POA: Diagnosis present

## 2021-12-07 DIAGNOSIS — I82532 Chronic embolism and thrombosis of left popliteal vein: Secondary | ICD-10-CM | POA: Diagnosis present

## 2021-12-07 DIAGNOSIS — L03116 Cellulitis of left lower limb: Secondary | ICD-10-CM | POA: Diagnosis not present

## 2021-12-07 DIAGNOSIS — I160 Hypertensive urgency: Secondary | ICD-10-CM | POA: Diagnosis not present

## 2021-12-07 DIAGNOSIS — Z888 Allergy status to other drugs, medicaments and biological substances status: Secondary | ICD-10-CM

## 2021-12-07 DIAGNOSIS — N1330 Unspecified hydronephrosis: Secondary | ICD-10-CM | POA: Diagnosis not present

## 2021-12-07 DIAGNOSIS — E66811 Obesity, class 1: Secondary | ICD-10-CM

## 2021-12-07 LAB — CBC WITH DIFFERENTIAL/PLATELET
Abs Immature Granulocytes: 0.02 10*3/uL (ref 0.00–0.07)
Basophils Absolute: 0 10*3/uL (ref 0.0–0.1)
Basophils Relative: 1 %
Eosinophils Absolute: 0.3 10*3/uL (ref 0.0–0.5)
Eosinophils Relative: 6 %
HCT: 28.3 % — ABNORMAL LOW (ref 39.0–52.0)
Hemoglobin: 8.9 g/dL — ABNORMAL LOW (ref 13.0–17.0)
Immature Granulocytes: 0 %
Lymphocytes Relative: 22 %
Lymphs Abs: 1.2 10*3/uL (ref 0.7–4.0)
MCH: 32.2 pg (ref 26.0–34.0)
MCHC: 31.4 g/dL (ref 30.0–36.0)
MCV: 102.5 fL — ABNORMAL HIGH (ref 80.0–100.0)
Monocytes Absolute: 0.6 10*3/uL (ref 0.1–1.0)
Monocytes Relative: 10 %
Neutro Abs: 3.5 10*3/uL (ref 1.7–7.7)
Neutrophils Relative %: 61 %
Platelets: 173 10*3/uL (ref 150–400)
RBC: 2.76 MIL/uL — ABNORMAL LOW (ref 4.22–5.81)
RDW: 16.2 % — ABNORMAL HIGH (ref 11.5–15.5)
WBC: 5.7 10*3/uL (ref 4.0–10.5)
nRBC: 0 % (ref 0.0–0.2)

## 2021-12-07 LAB — BASIC METABOLIC PANEL
Anion gap: 11 (ref 5–15)
BUN: 69 mg/dL — ABNORMAL HIGH (ref 8–23)
CO2: 22 mmol/L (ref 22–32)
Calcium: 7.4 mg/dL — ABNORMAL LOW (ref 8.9–10.3)
Chloride: 104 mmol/L (ref 98–111)
Creatinine, Ser: 3.68 mg/dL — ABNORMAL HIGH (ref 0.61–1.24)
GFR, Estimated: 17 mL/min — ABNORMAL LOW (ref >60)
Glucose, Bld: 138 mg/dL — ABNORMAL HIGH (ref 70–99)
Potassium: 5 mmol/L (ref 3.5–5.1)
Sodium: 137 mmol/L (ref 135–145)

## 2021-12-07 LAB — CK: Total CK: 180 U/L (ref 49–397)

## 2021-12-07 LAB — APTT: aPTT: 42 seconds — ABNORMAL HIGH (ref 24–36)

## 2021-12-07 LAB — RESP PANEL BY RT-PCR (FLU A&B, COVID) ARPGX2
Influenza A by PCR: NEGATIVE
Influenza B by PCR: NEGATIVE
SARS Coronavirus 2 by RT PCR: NEGATIVE

## 2021-12-07 LAB — HEPARIN LEVEL (UNFRACTIONATED): Heparin Unfractionated: >1.1 IU/mL — ABNORMAL HIGH (ref 0.30–0.70)

## 2021-12-07 LAB — SEDIMENTATION RATE: Sed Rate: 110 mm/hr — ABNORMAL HIGH (ref 0–16)

## 2021-12-07 LAB — CBG MONITORING, ED: Glucose-Capillary: 101 mg/dL — ABNORMAL HIGH (ref 70–99)

## 2021-12-07 LAB — TROPONIN I (HIGH SENSITIVITY)
Troponin I (High Sensitivity): 8 ng/L (ref <18)
Troponin I (High Sensitivity): 8 ng/L (ref <18)

## 2021-12-07 LAB — C-REACTIVE PROTEIN: CRP: 3 mg/dL — ABNORMAL HIGH (ref <1.0)

## 2021-12-07 LAB — GLUCOSE, CAPILLARY: Glucose-Capillary: 81 mg/dL (ref 70–99)

## 2021-12-07 IMAGING — DX DG TIBIA/FIBULA 2V*L*
4 series · 4 of 4 positions shown · non-contrast
Comparison: None.

CLINICAL DATA: Pain and swelling

EXAM:
LEFT TIBIA AND FIBULA - 2 VIEW

[tibia ap (1 of 2)]
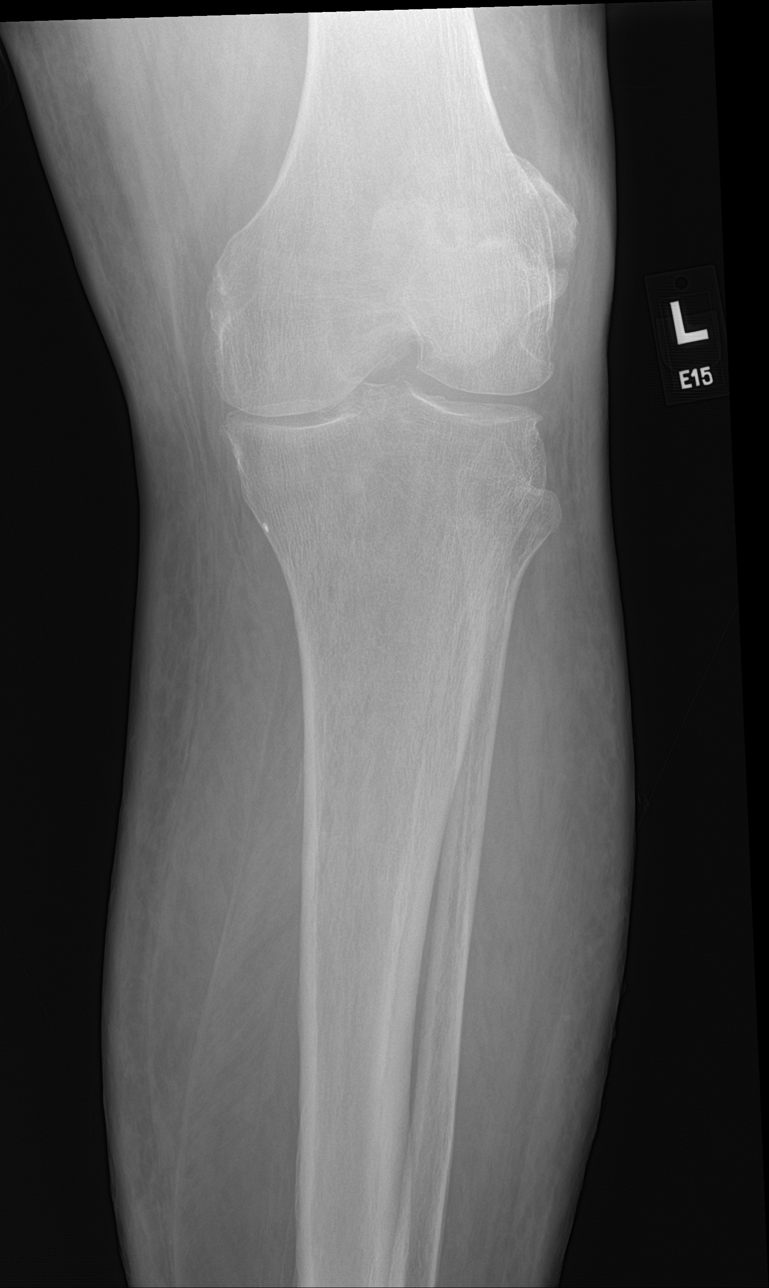

[tibia ap (2 of 2)]
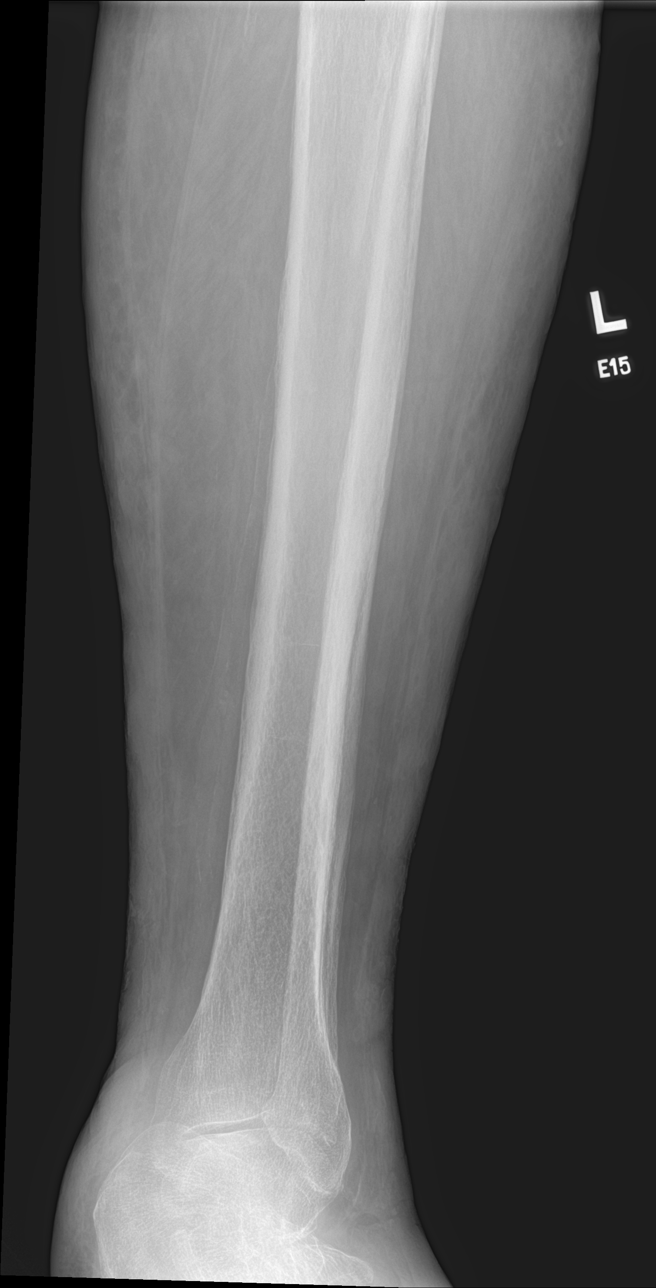

[tibia lat (1 of 2)]
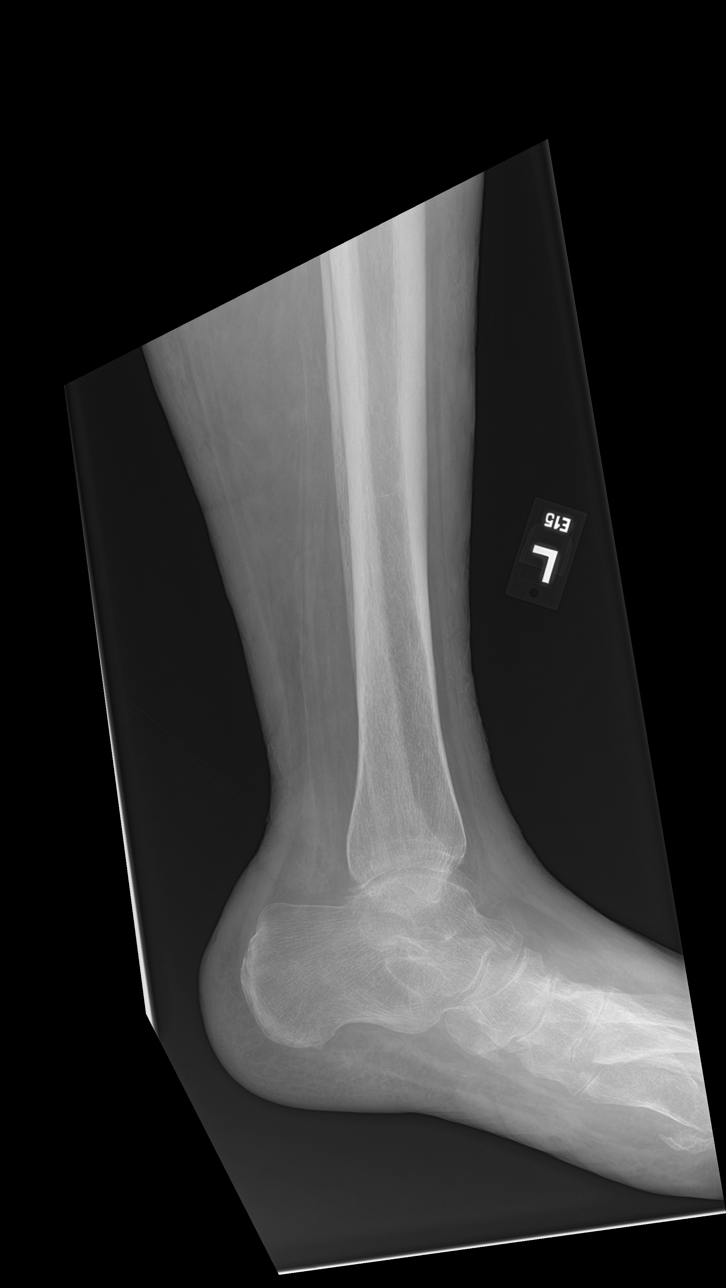

[tibia lat (2 of 2)]
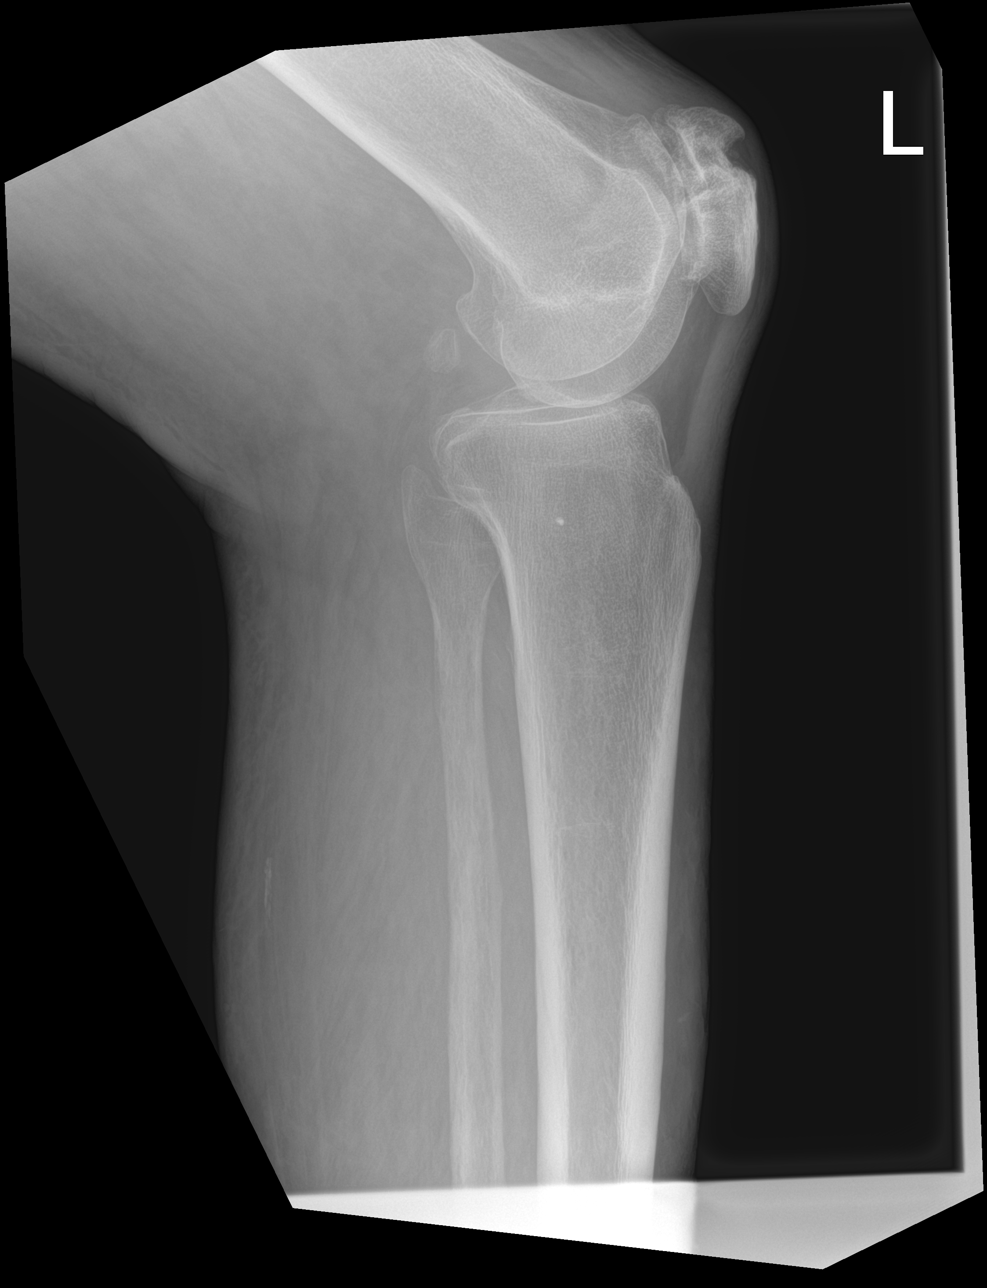

[4 of 4 positions shown; findings below may reference images not displayed]

FINDINGS: No recent fracture or dislocation is seen. There are no focal lytic
lesions. There are no demonstrable pockets of air in the soft
tissues. There is soft tissue swelling in the subcutaneous plane in
the lower leg and around the left ankle. Deformity in the patella
may be residual from previous injury. Degenerative changes are noted
with bony spurs in the left knee, more severe in the patellofemoral
compartment.
IMPRESSION: No fracture or dislocation is seen. There are no opaque foreign
bodies or abnormal pockets of air in the soft tissues.

If there is continued clinical suspicion for osteomyelitis,
follow-up three-phase bone scan or MRI may be considered.

## 2021-12-07 MED ORDER — CEFAZOLIN SODIUM-DEXTROSE 2-4 GM/100ML-% IV SOLN
2.0000 g | Freq: Two times a day (BID) | INTRAVENOUS | Status: DC
  Administered 2021-12-07 – 2021-12-09 (×4): 2 g via INTRAVENOUS
  Filled 2021-12-07 (×5): qty 100

## 2021-12-07 MED ORDER — SODIUM CHLORIDE 0.9 % IV SOLN: INTRAVENOUS | Status: AC

## 2021-12-07 MED ORDER — ONDANSETRON HCL 4 MG/2ML IJ SOLN
4.0000 mg | Freq: Four times a day (QID) | INTRAMUSCULAR | Status: DC | PRN
  Administered 2021-12-08 – 2021-12-10 (×4): 4 mg via INTRAVENOUS
  Filled 2021-12-07 (×5): qty 2

## 2021-12-07 MED ORDER — OXYCODONE HCL ER 20 MG PO T12A
20.0000 mg | EXTENDED_RELEASE_TABLET | Freq: Two times a day (BID) | ORAL | Status: AC
  Administered 2021-12-07: 20 mg via ORAL
  Filled 2021-12-07: qty 1

## 2021-12-07 MED ORDER — FENTANYL CITRATE PF 50 MCG/ML IJ SOSY
25.0000 ug | PREFILLED_SYRINGE | Freq: Once | INTRAMUSCULAR | Status: AC
  Administered 2021-12-07: 25 ug via INTRAVENOUS
  Filled 2021-12-07: qty 1

## 2021-12-07 MED ORDER — CEFAZOLIN SODIUM-DEXTROSE 2-4 GM/100ML-% IV SOLN
2.0000 g | Freq: Once | INTRAVENOUS | Status: DC
  Filled 2021-12-07: qty 100

## 2021-12-07 MED ORDER — ACETAMINOPHEN 325 MG PO TABS
650.0000 mg | ORAL_TABLET | Freq: Four times a day (QID) | ORAL | Status: DC | PRN
  Administered 2021-12-14 – 2021-12-15 (×2): 650 mg via ORAL
  Filled 2021-12-07 (×2): qty 2

## 2021-12-07 MED ORDER — ONDANSETRON HCL 4 MG PO TABS: 4.0000 mg | ORAL_TABLET | Freq: Four times a day (QID) | ORAL | Status: DC | PRN

## 2021-12-07 MED ORDER — HEPARIN (PORCINE) 25000 UT/250ML-% IV SOLN
1800.0000 [IU]/h | INTRAVENOUS | Status: DC
  Administered 2021-12-07: 19:00:00 1600 [IU]/h via INTRAVENOUS
  Administered 2021-12-08: 1800 [IU]/h via INTRAVENOUS
  Filled 2021-12-07 (×2): qty 250

## 2021-12-07 MED ORDER — ACETAMINOPHEN 650 MG RE SUPP: 650.0000 mg | Freq: Four times a day (QID) | RECTAL | Status: DC | PRN

## 2021-12-07 MED ORDER — INSULIN ASPART 100 UNIT/ML IJ SOLN
0.0000 [IU] | Freq: Three times a day (TID) | INTRAMUSCULAR | Status: DC
  Filled 2021-12-07: qty 0.06

## 2021-12-07 MED ORDER — OXYCODONE HCL ER 20 MG PO T12A: 30.0000 mg | EXTENDED_RELEASE_TABLET | Freq: Two times a day (BID) | ORAL | Status: DC

## 2021-12-07 NOTE — H&P (Signed)
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  Noted to be mildly disoriented in the ED. 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  Found to have ATN with a edematous and warm appearing left lower extremity started on antibiotics, heparin drip and admission requested for further management   Assessment/Plan  Left leg cellulitis: Patient with redness tenderness and weeping on left lower extremity.  We will continue empiric antibiotics, sodium likely worsened by his DVT.Current evidence of sepsis no leukocytosis.  DVT Lt LE w/ more proximal propagation Hx of PE: Patient compliant on Eliquis as per report.  Unclear if Eliquis failure versus natural progression of DVT.  For now keeping on heparin drip and will need to discuss with hematology versus vascular/??  IVC filter versus alternative anticoagulation.  AKI on CKD 4 versus progression of  CKD IV: Baseline creatinine on last discharge 2.7 on 1/223, was 3.5 prior to that.  Continue gentle IV fluids overnight, bmp in am.  [name], right: Has a right prosthesis and ambulates with walker at home lives alone  DM type 2 with recent admission for hypoglycemia in January: We will keep on sliding scale insulin. HLD: resume statin HTN: BP stable resume home meds  Chronic combined systolic and diastolic CHF: Appears on dry side to side with recent diarrhea.  Monitor volume status intake output  GERD: Continue PPI BPH: Continue Flomax and Proscar.  Chronic pain syndrome History of gout: Continue his OxyContin, Neurontin,  Physical deconditioning: PT OT eval will be requested Hypothyroidism-resume Synthroid Anemia of chronic kidney disease hemoglobin at baseline.  Continue iron supplement and monitor   Body mass index is 32.3 kg/m.   Severity of Illness: The appropriate patient status for this patient is INPATIENT. Inpatient status is judged to be reasonable and necessary in order to provide the required intensity of service to ensure the patient's safety. The patient's presenting symptoms, physical exam findings, and initial radiographic and laboratory data in the context of their chronic comorbidities is felt to place them at high risk for further clinical deterioration. Furthermore, it is not anticipated that the patient will be medically stable for discharge from the hospital within 2 midnights of admission.   * I certify that at the point of admission it is my clinical judgment that the patient will require inpatient hospital care spanning beyond 2 midnights from the point of admission due to high intensity of service, high risk for further deterioration and high frequency of surveillance required.*   DVT prophylaxis:   Heparin  drip Code Status:   Code Status: Full Code  Family Communication: Admission, patients condition and plan of care including tests being ordered have  been discussed with the patient and who indicate understanding and agree with the plan and Code Status.  Consults called: None  Review of Systems: All systems were reviewed and were negative except as mentioned in HPI above. Negative for fever Negative for chest pain Negative for shortness of breath  Past Medical History:  Diagnosis Date   Arthritis    BPH (benign prostatic hyperplasia) 11/24/2019   Chronic pain syndrome 11/24/2019   CKD (chronic kidney disease), stage IV ([place]) 11/24/2019   Diabetes mellitus without complication (HCC)    Gout    Hypertension    Muscle pain    Physical deconditioning 11/24/2019   Scars    Swelling     Past Surgical History:  Procedure Laterality Date   BELOW KNEE LEG AMPUTATION Right    ESOPHAGOGASTRODUODENOSCOPY (EGD) WITH PROPOFOL N/A 11/25/2019   Procedure: ESOPHAGOGASTRODUODENOSCOPY (EGD) WITH PROPOFOL;  Surgeon: Carol Ada, MD;  Location: [place];  Service: Endoscopy;  Laterality: N/A;   IR FLUORO GUIDE CV LINE RIGHT  09/05/2019   IR LUMBAR DISC ASPIRATION W/IMG GUIDE  11/26/2019   IR US GUIDE VASC ACCESS RIGHT  09/05/2019   RADIOLOGY WITH ANESTHESIA N/A 11/25/2019   Procedure: Thoracic and Lumbar MRI WITH ANESTHESIA;  Surgeon: Radiologist, Medication, MD;  Location: [place];  Service: Radiology;  Laterality: N/A;     reports that he has never smoked. He has never been exposed to tobacco smoke. He has never used smokeless tobacco. He reports that he does not drink alcohol and does not use drugs.  Allergies  Allergen Reactions   Other Other (See Comments)    Blood pressure issues Per [facility] hospital: Pt states he can only take these pain meds or else he gets very sick: Oxycontin,morphine,demerol, and dilaudid are the only pain meds pt states he can take.     Oxymorphone Other (See Comments)    Causes kidney problems   Aspirin Nausea And Vomiting    Per [facility]   Beta Vulgaris Nausea And Vomiting    Beets   Buspirone Nausea And  Vomiting and Other (See Comments)    Affected his head   Cabbage Nausea And Vomiting   Codeine Other (See Comments)    Unknown reaction   Dolobid [Diflunisal] Other (See Comments)    Unknown reaction   Fish Allergy Nausea And Vomiting   Fish-Derived Products Nausea And Vomiting   Methadone Other (See Comments)    Made him loopy   Pentazocine Other (See Comments)    Unknown reaction   Propoxyphene Nausea And Vomiting and Other (See Comments)    Messed with his head   Shellfish Allergy Nausea And Vomiting   Sulfa Antibiotics Hives   Sulfasalazine Other (See Comments)    Unknown reaction   Vistaril [Hydroxyzine] Nausea And Vomiting and Other (See Comments)    Didn't help   Amoxicillin Rash    Per [facility]    Family History  Problem Relation Age of Onset   Heart disease Mother    Cancer Father      Prior to Admission medications   Medication Sig Start Date End Date Taking? Authorizing Provider  allopurinol (ZYLOPRIM) 100 MG tablet Take 100 mg by mouth daily.     [provider]  apixaban (ELIQUIS) 5 MG TABS tablet Take 1 tablet by mouth 2 times daily. 10/22/21 01/20/22  Irene Pap N, DO  atorvastatin (LIPITOR) 80 MG tablet Take 1 tablet (80 mg total) by mouth daily. 09/25/20   Jose Persia, MD  bisacodyl (DULCOLAX) 10 MG suppository Place 10 mg rectally at bedtime.    [provider]  colchicine 0.6 MG tablet Take 0.6 mg by mouth daily. 08/21/21   [provider]  ferrous sulfate 325 (65 FE) MG tablet Take 1 tablet by mouth daily with breakfast. 10/01/21 10/31/21  Kayleen Memos, DO  finasteride (PROSCAR) 5 MG tablet Take 1 tablet (5 mg total) by mouth daily. 10/10/19   Hosie Poisson, MD  gabapentin (NEURONTIN) 300 MG capsule Take 1 capsule (300 mg total) by mouth at bedtime. Patient taking differently: Take 300 mg by mouth every 8 (eight) hours. 06/06/21   Nita Sells, MD  hydrALAZINE (APRESOLINE) 100 MG tablet Take 1 tablet by mouth  every 8 hours. Patient taking differently: Take 100 mg by mouth every 8 (eight) hours as needed (high blood pressure >150/100). 10/01/21 10/31/21  Kayleen Memos, DO  levothyroxine (SYNTHROID) 50 MCG tablet Take 50 mcg by mouth daily before breakfast.     [provider]  methocarbamol (ROBAXIN) 750 MG tablet Take 1 tablet (750 mg total) by mouth every 8 (eight) hours as needed for muscle spasms. 06/06/21   Nita Sells, MD  metoprolol tartrate (LOPRESSOR) 100 MG tablet Take 0.5 tablets (50 mg total) by mouth daily. 10/23/21   Georgette Shell, MD  OXYCONTIN 30 MG 12 hr tablet Take 1 tablet (30 mg total) by mouth 2 (two) times daily. Patient taking differently: Take 30 mg by mouth every 8 (eight) hours. 10/14/21   Danford, Suann Larry, MD  pantoprazole (PROTONIX) 40 MG tablet Take 1 tablet by mouth 2 times daily. 10/01/21 10/31/21  Kayleen Memos, DO  polyethylene glycol (MIRALAX) 17 g packet Take 17 g by mouth daily as needed. Patient taking differently: Take 17 g by mouth daily as needed (constipation). 10/01/21   Kayleen Memos, DO  tamsulosin (FLOMAX) 0.4 MG CAPS capsule Take 1 capsule (0.4 mg total) by mouth daily. 09/25/20   Jose Persia, MD    Physical Exam: Vitals:   12/07/21 1430 12/07/21 1446 12/07/21 1500 12/07/21 1530  BP: 122/73  125/68 127/72  Pulse: (!) 57  (!) 56 (!) 55  Resp: 20  15 16   Temp:      TempSrc:      SpO2: 93%  96% 96%  Weight:  126.8 kg     General exam: AAO, obese, on room air, pleasant, NAD, weak appearing. HEENT:Oral mucosa moist, Ear/Nose WNL grossly, dentition normal. Respiratory system: bilaterally clear,no wheezing or crackles,no use of accessory muscle Cardiovascular system: S1 & S2 +, No JVD,. Gastrointestinal system: Abdomen soft, NT,ND, BS+ Nervous System:Alert, awake, moving extremities and grossly nonfocal Extremities: Erythematous tender weeping left lower extremity, right BKA, see picture  Skin: No rashes,no icterus. MSK:  Normal muscle bulk,tone, power    Labs on Admission: I have personally reviewed following labs and imaging studies  CBC: Recent Labs  Lab 12/07/21 1445  WBC 5.7  NEUTROABS 3.5  HGB 8.9*  HCT 28.3*  MCV 102.5*  PLT 144   Basic Metabolic Panel: Recent Labs  Lab 12/07/21 1445  NA 137  K 5.0  CL 104  CO2 22  GLUCOSE 138*  BUN 69*  CREATININE 3.68*  CALCIUM 7.4*   GFR: Estimated Creatinine Clearance: 28.7 mL/min (A) (by C-G formula based on SCr of 3.68 mg/dL (H)). Liver  Function Tests: No results for input(s): AST, ALT, ALKPHOS, BILITOT, PROT, ALBUMIN in the last 168 hours. No results for input(s): LIPASE, AMYLASE in the last 168 hours. No results for input(s): AMMONIA in the last 168 hours. Coagulation Profile: No results for input(s): INR, PROTIME in the last 168 hours. Cardiac Enzymes: Recent Labs  Lab 12/07/21 1445  CKTOTAL 180   BNP (last 3 results) No results for input(s): PROBNP in the last 8760 hours. HbA1C: No results for input(s): HGBA1C in the last 72 hours. CBG: No results for input(s): GLUCAP in the last 168 hours. Lipid Profile: No results for input(s): CHOL, HDL, LDLCALC, TRIG, CHOLHDL, LDLDIRECT in the last 72 hours. Thyroid Function Tests: No results for input(s): TSH, T4TOTAL, FREET4, T3FREE, THYROIDAB in the last 72 hours. Anemia Panel: No results for input(s): VITAMINB12, FOLATE, FERRITIN, TIBC, IRON, RETICCTPCT in the last 72 hours. Urine analysis:    Component Value Date/Time   COLORURINE YELLOW 10/18/2021 0037   APPEARANCEUR HAZY (A) 10/18/2021 0037   LABSPEC 1.010 10/18/2021 0037   PHURINE 5.0 10/18/2021 0037   GLUCOSEU NEGATIVE 10/18/2021 0037   HGBUR SMALL (A) 10/18/2021 0037   BILIRUBINUR NEGATIVE 10/18/2021 0037   KETONESUR NEGATIVE 10/18/2021 0037   PROTEINUR 100 (A) 10/18/2021 0037   NITRITE NEGATIVE 10/18/2021 0037   LEUKOCYTESUR LARGE (A) 10/18/2021 0037    Radiological Exams on Admission: DG Tibia/Fibula  Left  Result Date: 12/07/2021 CLINICAL DATA:  Pain and swelling EXAM: LEFT TIBIA AND FIBULA - 2 VIEW COMPARISON:  None. FINDINGS: No recent fracture or dislocation is seen. There are no focal lytic lesions. There are no demonstrable pockets of air in the soft tissues. There is soft tissue swelling in the subcutaneous plane in the lower leg and around the left ankle. Deformity in the patella may be residual from previous injury. Degenerative changes are noted with bony spurs in the left knee, more severe in the patellofemoral compartment. IMPRESSION: No fracture or dislocation is seen. There are no opaque foreign bodies or abnormal pockets of air in the soft tissues. If there is continued clinical suspicion for osteomyelitis, follow-up three-phase bone scan or MRI may be considered. Electronically Signed   By: Elmer Picker M.D.   On: 12/07/2021 15:35   VAS [place] LOWER EXTREMITY VENOUS (DVT) (7a-7p)  Result Date: 12/07/2021  Lower Venous DVT Study Patient Name:  Thomas [name]  Date of Exam:   12/07/2021 Medical Rec #: [id number]        Accession #:    [id number] Date of Birth: [birth year]        Patient Gender: M Patient Age:   68 years Exam Location:  [facility] Procedure:      VAS [place] LOWER EXTREMITY VENOUS (DVT) Referring Phys: Thelma Comp NANAVATI --------------------------------------------------------------------------------  Indications: Erythema, Edema, and wounds.  Limitations: Body habitus, poor ultrasound/tissue interface, open wound and position. Comparison Study: 09/17/2021 left lower extremity venous duplex- age                   indeterminate DVT left mid and distal femoral vein. Performing Technologist: Maudry Mayhew MHA, RDMS, RVT, RDCS  Examination Guidelines: A complete evaluation includes B-mode imaging, spectral Doppler, color Doppler, and power Doppler as needed of all accessible portions of each vessel. Bilateral testing is considered an integral part of a complete examination.  Limited examinations for reoccurring indications may be performed as noted. The reflux portion of the exam is performed with the patient in reverse Trendelenburg.  Right Technical Findings:  Not visualized segments include CFV.  +---------+---------------+---------+-----------+----------+-----------------+  LEFT      Compressibility Phasicity Spontaneity Properties Thrombus Aging     +---------+---------------+---------+-----------+----------+-----------------+  CFV       Full            Yes       Yes                                       +---------+---------------+---------+-----------+----------+-----------------+  SFJ       Full                                                                +---------+---------------+---------+-----------+----------+-----------------+  FV Prox   Partial         Yes       Yes                    Age Indeterminate  +---------+---------------+---------+-----------+----------+-----------------+  FV Mid    Partial                                          Age Indeterminate  +---------+---------------+---------+-----------+----------+-----------------+  FV Distal None                      No                     Chronic            +---------+---------------+---------+-----------+----------+-----------------+  PFV       Full                                                                +---------+---------------+---------+-----------+----------+-----------------+  POP       Full            Yes       Yes                                       +---------+---------------+---------+-----------+----------+-----------------+  PTV       Full                      Yes                                       +---------+---------------+---------+-----------+----------+-----------------+   Left Technical Findings: Not visualized segments include peroneal veins, limited visualization left PTV.   Summary: LEFT: - Findings consistent with age indeterminate deep vein thrombosis involving the left proximal  and mid femoral vein. - Findings consistent with chronic deep vein thrombosis involving the distal left femoral vein. - No cystic structure found in the popliteal fossa.  When compared to prior study 09/17/2021, there appears  to have been some proximal propagation to the proximal left femoral vein.  *See table(s) above for measurements and observations.    Preliminary       Antonieta Pert MD [facility]  If 7PM-7AM, please contact night-coverage [email or web address]  12/07/2021, 4:56 PM

## 2021-12-07 NOTE — Progress Notes (Signed)
Left lower extremity venous duplex completed. Refer to "CV Proc" under chart review to view preliminary results.  12/07/2021 3:13 PM Kelby Aline., MHA, RVT, RDCS, RDMS

## 2021-12-07 NOTE — Progress Notes (Signed)
ANTICOAGULATION CONSULT NOTE - Initial Consult  Pharmacy Consult for heparin Indication: worsening DVT on apixaban  Allergies  Allergen Reactions   Other Other (See Comments)    Blood pressure issues Per [facility] hospital: Pt states he can only take these pain meds or else he gets very sick: Oxycontin,morphine,demerol, and dilaudid are the only pain meds pt states he can take.     Oxymorphone Other (See Comments)    Causes kidney problems   Aspirin Nausea And Vomiting    Per [facility]   Beta Vulgaris Nausea And Vomiting    Beets   Buspirone Nausea And Vomiting and Other (See Comments)    Affected his head   Cabbage Nausea And Vomiting   Codeine Other (See Comments)    Unknown reaction   Dolobid [Diflunisal] Other (See Comments)    Unknown reaction   Fish Allergy Nausea And Vomiting   Fish-Derived Products Nausea And Vomiting   Methadone Other (See Comments)    Made him loopy   Pentazocine Other (See Comments)    Unknown reaction   Propoxyphene Nausea And Vomiting and Other (See Comments)    Messed with his head   Shellfish Allergy Nausea And Vomiting   Sulfa Antibiotics Hives   Sulfasalazine Other (See Comments)    Unknown reaction   Vistaril [Hydroxyzine] Nausea And Vomiting and Other (See Comments)    Didn't help   Amoxicillin Rash    Per [facility]    Patient Measurements:   Heparin Dosing Weight: 105.6 kg  Vital Signs: Temp: 98.1 F (36.7 C) (02/18 1250) Temp Source: Oral (02/18 1250) BP: 127/72 (02/18 1530) Pulse Rate: 55 (02/18 1530)  Labs: Recent Labs    12/07/21 1445  HGB 8.9*  HCT 28.3*  PLT 173  CREATININE 3.68*  CKTOTAL 180  TROPONINIHS 8    CrCl cannot be calculated (Unknown ideal weight.).   Medical History: Past Medical History:  Diagnosis Date   Arthritis    BPH (benign prostatic hyperplasia) 11/24/2019   Chronic pain syndrome 11/24/2019   CKD (chronic kidney disease), stage IV ([place]) 11/24/2019   Diabetes mellitus  without complication (HCC)    Gout    Hypertension    Muscle pain    Physical deconditioning 11/24/2019   Scars    Swelling     Assessment: Pharmacy consulted to dose heparin for DVT worsening on apixaban therapy.  On apixaban 5 mg po BID PTA- last dose today at 07 am per pt SCr 3.68 Wt 10/20/2021 126.8 kg, Ht 6 foot 6 inches  Goal of Therapy:  Heparin level 0.3-0.7 units/ml aPTT 66-102 seconds Monitor platelets by anticoagulation protocol: Yes   Plan:  Draw baseline aPTT/heparin level now> expect heparin level to be elevated d/t apixaban PTA  No bolus After baseline labs drawn: Start heparin now at 1600 units/hr and check 8 hr aPTT Daily aPTT/heparin level/CBC  Eudelia Bunch, Pharm.D 12/07/2021 5:24 PM

## 2021-12-07 NOTE — ED Triage Notes (Signed)
Pt presents via EMS from home with c/o left leg swelling/seeping. Area is hot to touch. Pt does not have much feeling to his leg/foot. Pt reports this is abnormal for him. Pt has a hx of diabetes, right leg amputation. Pt is somewhat disoriented at this time. Possible faint smell of urine per EMS. Pt's CBG was 155 for EMS, BP 166/82, HR 64, SPO2 98% for EMS.

## 2021-12-07 NOTE — ED Provider Notes (Signed)
[facility] DEPT Provider Note   CSN: [id number] Arrival date & time: 12/07/21  1234     History  Chief Complaint  Patient presents with   Leg Swelling    ARLENE GENOVA is a 71 y.o. male.  HPI     Pt comes in with cc of LLE swelling and pain x 1 week, pain and swelling getting worse.  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  The patient has noticed increased swelling and redness of his left calf and foot over the past week. Today, the pain progressed to his left thigh and groin and he noticed that his left calf "felt wet". He describes the pain as tight "like it is going to bust" and "electric" and rates this pain an 8/10. The swelling worsens with prolonged sitting or standing and nothing relieves his symptoms. He does report taking his Eliquis as prescribed. He reports chills but denies fever, CP, palpitations, cough, and SOB.   Home Medications Prior to Admission medications   Medication Sig Start Date End Date Taking? Authorizing Provider  allopurinol (ZYLOPRIM) 100 MG tablet Take 100 mg by mouth daily.     [provider]  apixaban (ELIQUIS) 5 MG TABS tablet Take 1 tablet by mouth 2 times daily. 10/22/21 01/20/22  Kayleen Memos, DO  atorvastatin (LIPITOR) 80 MG tablet Take 1 tablet (80 mg total) by mouth daily. 09/25/20   Jose Persia, MD  bisacodyl (DULCOLAX) 10 MG suppository Place 10 mg rectally at bedtime.    [provider]  colchicine 0.6 MG tablet Take 0.6 mg by mouth daily. 08/21/21   [provider]  ferrous sulfate 325 (65 FE) MG tablet Take 1 tablet by mouth daily with breakfast. 10/01/21 10/31/21  Kayleen Memos, DO  finasteride (PROSCAR) 5 MG tablet Take 1 tablet (5 mg total) by mouth daily. 10/10/19   Hosie Poisson, MD  gabapentin (NEURONTIN) 300 MG  capsule Take 1 capsule (300 mg total) by mouth at bedtime. Patient taking differently: Take 300 mg by mouth every 8 (eight) hours. 06/06/21   Nita Sells, MD  hydrALAZINE (APRESOLINE) 100 MG tablet Take 1 tablet by mouth every 8 hours. Patient taking differently: Take 100 mg by mouth every 8 (eight) hours as needed (high blood pressure >150/100). 10/01/21 10/31/21  Kayleen Memos, DO  levothyroxine (SYNTHROID) 50 MCG tablet Take 50 mcg by mouth daily before breakfast.     [provider]  methocarbamol (ROBAXIN) 750 MG tablet Take 1 tablet (750 mg total) by mouth every 8 (eight) hours as needed for muscle spasms. 06/06/21   Nita Sells, MD  metoprolol tartrate (LOPRESSOR) 100 MG tablet Take 0.5 tablets (50 mg total) by mouth daily. 10/23/21   Georgette Shell, MD  OXYCONTIN 30 MG 12 hr tablet Take 1 tablet (30 mg total) by mouth 2 (two) times daily. Patient taking differently: Take 30 mg by mouth every 8 (eight) hours. 10/14/21   Danford, Suann Larry, MD  pantoprazole (PROTONIX) 40 MG tablet Take 1 tablet by mouth 2 times daily. 10/01/21 10/31/21  Kayleen Memos, DO  polyethylene glycol (MIRALAX) 17 g packet Take 17 g by mouth daily as needed. Patient taking differently: Take 17 g by mouth daily as needed (constipation). 10/01/21   Kayleen Memos, DO  tamsulosin (FLOMAX) 0.4 MG CAPS capsule Take  1 capsule (0.4 mg total) by mouth daily. 09/25/20   Jose Persia, MD      Allergies    Other, Oxymorphone, Aspirin, Beta vulgaris, Buspirone, Cabbage, Codeine, Dolobid [diflunisal], Fish allergy, Fish-derived products, Methadone, Pentazocine, Propoxyphene, Shellfish allergy, Sulfa antibiotics, Sulfasalazine, Vistaril [hydroxyzine], and Amoxicillin    Review of Systems   Review of Systems  All other systems reviewed and are negative.  Physical Exam Updated Vital Signs BP 127/72    Pulse (!) 55    Temp 98.1 F (36.7 C) (Oral)    Resp 16    Wt 126.8 kg Comment: per Epic on  10/20/2021   SpO2 96%    BMI 32.30 kg/m  Physical Exam Vitals and nursing note reviewed.  Constitutional:      Appearance: He is well-developed.  HENT:     Head: Atraumatic.  Cardiovascular:     Rate and Rhythm: Normal rate.  Pulmonary:     Effort: Pulmonary effort is normal.  Musculoskeletal:        General: Swelling present.     Cervical back: Neck supple.     Comments: Left lower extremity is edematous, erythematous, with significant hypertrophy of the skin.  There is also areas of excoriation of the skin, and serous fluid drainage.   Skin:    General: Skin is warm.  Neurological:     Mental Status: He is alert and oriented to person, place, and time.       ED Results / Procedures / Treatments   Labs (all labs ordered are listed, but only abnormal results are displayed) Labs Reviewed  CBC WITH DIFFERENTIAL/PLATELET - Abnormal; Notable for the following components:      Result Value   RBC 2.76 (*)    Hemoglobin 8.9 (*)    HCT 28.3 (*)    MCV 102.5 (*)    RDW 16.2 (*)    All other components within normal limits  BASIC METABOLIC PANEL - Abnormal; Notable for the following components:   Glucose, Bld 138 (*)    BUN 69 (*)    Creatinine, Ser 3.68 (*)    Calcium 7.4 (*)    GFR, Estimated 17 (*)    All other components within normal limits  SEDIMENTATION RATE - Abnormal; Notable for the following components:   Sed Rate 110 (*)    All other components within normal limits  RESP PANEL BY RT-PCR (FLU A&B, COVID) ARPGX2  CK  C-REACTIVE PROTEIN  APTT  HEPARIN LEVEL (UNFRACTIONATED)  TROPONIN I (HIGH SENSITIVITY)  TROPONIN I (HIGH SENSITIVITY)    EKG None  Radiology DG Tibia/Fibula Left  Result Date: 12/07/2021 CLINICAL DATA:  Pain and swelling EXAM: LEFT TIBIA AND FIBULA - 2 VIEW COMPARISON:  None. FINDINGS: No recent fracture or dislocation is seen. There are no focal lytic lesions. There are no demonstrable pockets of air in the soft tissues. There is soft tissue  swelling in the subcutaneous plane in the lower leg and around the left ankle. Deformity in the patella may be residual from previous injury. Degenerative changes are noted with bony spurs in the left knee, more severe in the patellofemoral compartment. IMPRESSION: No fracture or dislocation is seen. There are no opaque foreign bodies or abnormal pockets of air in the soft tissues. If there is continued clinical suspicion for osteomyelitis, follow-up three-phase bone scan or MRI may be considered. Electronically Signed   By: Elmer Picker M.D.   On: 12/07/2021 15:35   VAS [place] LOWER EXTREMITY VENOUS (DVT) (  7a-7p)  Result Date: 12/07/2021  Lower Venous DVT Study Patient Name:  BOLUWATIFE FLIGHT  Date of Exam:   12/07/2021 Medical Rec #: [id number]        Accession #:    [id number] Date of Birth: 07/18/1951        Patient Gender: M Patient Age:   53 years Exam Location:  [facility] Procedure:      VAS [place] LOWER EXTREMITY VENOUS (DVT) Referring Phys: Thelma Comp Dajai Wahlert --------------------------------------------------------------------------------  Indications: Erythema, Edema, and wounds.  Limitations: Body habitus, poor ultrasound/tissue interface, open wound and position. Comparison Study: 09/17/2021 left lower extremity venous duplex- age                   indeterminate DVT left mid and distal femoral vein. Performing Technologist: Maudry Mayhew MHA, RDMS, RVT, RDCS  Examination Guidelines: A complete evaluation includes B-mode imaging, spectral Doppler, color Doppler, and power Doppler as needed of all accessible portions of each vessel. Bilateral testing is considered an integral part of a complete examination. Limited examinations for reoccurring indications may be performed as noted. The reflux portion of the exam is performed with the patient in reverse Trendelenburg.  Right Technical Findings: Not visualized segments include CFV.   +---------+---------------+---------+-----------+----------+-----------------+  LEFT      Compressibility Phasicity Spontaneity Properties Thrombus Aging     +---------+---------------+---------+-----------+----------+-----------------+  CFV       Full            Yes       Yes                                       +---------+---------------+---------+-----------+----------+-----------------+  SFJ       Full                                                                +---------+---------------+---------+-----------+----------+-----------------+  FV Prox   Partial         Yes       Yes                    Age Indeterminate  +---------+---------------+---------+-----------+----------+-----------------+  FV Mid    Partial                                          Age Indeterminate  +---------+---------------+---------+-----------+----------+-----------------+  FV Distal None                      No                     Chronic            +---------+---------------+---------+-----------+----------+-----------------+  PFV       Full                                                                +---------+---------------+---------+-----------+----------+-----------------+  POP       Full            Yes       Yes                                       +---------+---------------+---------+-----------+----------+-----------------+  PTV       Full                      Yes                                       +---------+---------------+---------+-----------+----------+-----------------+   Left Technical Findings: Not visualized segments include peroneal veins, limited visualization left PTV.   Summary: LEFT: - Findings consistent with age indeterminate deep vein thrombosis involving the left proximal and mid femoral vein. - Findings consistent with chronic deep vein thrombosis involving the distal left femoral vein. - No cystic structure found in the popliteal fossa.  When compared to prior study 09/17/2021, there appears to  have been some proximal propagation to the proximal left femoral vein.  *See table(s) above for measurements and observations.    Preliminary     Procedures Procedures    Medications Ordered in ED Medications  ceFAZolin (ANCEF) IVPB 2g/100 mL premix (has no administration in time range)    ED Course/ Medical Decision Making/ A&P Clinical Course as of 12/07/21 1644  Sat Dec 07, 2021  1643 VAS [place] LOWER EXTREMITY VENOUS (DVT) (7a-7p) DVT study indicates chronic clot that has extended proximally.  This is despite patient alleging that he has been taking Ancef as prescribed.  Possibly failure of anticoagulation.  Patient might be a candidate for IVC filter.  Currently no chest pain, shortness of breath or hypoxia.  PE study not indicated at this time.  This concern has been discussed with the admitting service as well along with concerns for acute cellulitis. [AN]    Clinical Course User Index [AN] Varney Biles, MD                           Medical Decision Making Amount and/or Complexity of Data Reviewed Labs: ordered. Radiology: ordered. Decision-making details documented in ED Course. ECG/medicine tests: ordered.  Risk Prescription drug management. Decision regarding hospitalization.   This patient presents to the ED with chief complaint(s) of left leg swelling and discomfort with pertinent past medical history of DVT/PE on apixaban, diabetes, neuropathy, right lower extremity amputation which further complicates the presenting complaint. The complaint involves an extensive differential diagnosis and treatment options and also carries with it a high risk of complications and morbidity.    The differential diagnosis includes cellulitis, DVT, venous stasis dermatitis  The initial plan is to order basic labs, vascular ultrasound duplex of the left lower extremity.   Additional history obtained: Records reviewed previous admission documents.  Patient recently found to have  DVT/PE and has had admission for acute renal failure and hypoglycemia.  Reassessment and review (also see workup area): Lab Tests: I Ordered, and personally interpreted labs.  The pertinent results include: CBC that does not reveal leukocytosis, sed rate that is significantly high.  Creatinine which is elevated compared to baseline, but has been higher in the past.  Chronic anemia, which appears to be baseline.  Imaging Studies: I ordered and independently visualized and interpreted the following imaging x-ray of the left leg which showed no evidence of any gas/free air. The interpretation of the imaging was limited to assessing for emergent pathology, for which purpose it was ordered.   Medicines ordered and prescription drug management: I ordered the following medications IV Ancef and IV heparin for likely cellulitis and DVT.   Reevaluation of the patient after these medicines showed that the patient    stayed the same   Cardiac Monitoring: The patient was maintained on a cardiac monitor.  I personally viewed and interpreted the cardiac monitor which showed an underlying rhythm of:  sinus rhythm  Complexity of problems addressed: Patients presentation is most consistent with  severe exacerbation of chronic illness During patient's assessment  Disposition: After consideration of the diagnostic results and the patients response to treatment,  I feel that the patent would benefit from admission   .    Antibiotics initiated as most likely the acute processes cellulitis.  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

## 2021-12-07 NOTE — Hospital Course (Addendum)
71 year old male with multiple complex history including CKD stage IV- baseline creat 2.5-3s, T2DM, LLE DVT and PE on Eliquis, chronic pain, hypertension, anemia of CKD, BPH, hypothyroidism, chronic combined systolic and diastolic CHF-LVEF 91-79% 15/0/56, history of gout, multiple previous admission and recently in January for hypoglycemia,in Dec 2022 for AKI/acute metabolic encephalopathy, who ambulates with a walker with Rt leg prosthesis, lives alone presented to the ED with complaining of 1 week of worsening pain on the left leg along with weeping redness.He also reported he is getting over from recent diarrhea.  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  Found to have cellulitis w/ edematous and warm appearing left lower extremity started on antibiotics, heparin drip and admission requested for further management. Seen by vascular felt this as an anticoagulation failure, recommend Unna boot and compression stockings with wound care, patient removed Unna boot after placement as unable to tolerate. Repeat ABI within normal limits. PTOT has seen and advised home health PT.  Which he has refused patient also reports placement.  Discussed with Dr. Irene Limbo from hematology about overall condition he advised to switch to Coumadin with heparin bridge given his anticoagulation failure, renal dysfunction. INR slowly coming up 1.7/24-once INR close to 2 we will discharge him on Coumadin.  At this time INR is trending up and therapeutic at 2.1 on 2/26.  Patient wanted to make sure he is able to mobilize prior to discharge as he lives alone.  At this time is medically stable for discharge  home-with outpatient follow-up PT/INR monitoring

## 2021-12-07 NOTE — Progress Notes (Signed)
Pharmacy Antibiotic Note  Thomas [name] is a 71 y.o. male admitted on 12/07/2021 with cellulitis.  Pharmacy has been consulted for cefazolin dosing.  Plan: Cefazolin 2 gm IV q12h  Height:  (per Epic on 10/21/2021) Weight: 126.8 kg (279 lb 8.7 oz) (per Epic on 10/20/2021)  Temp (24hrs), Avg:98.1 F (36.7 C), Min:98.1 F (36.7 C), Max:98.1 F (36.7 C)  Recent Labs  Lab 12/07/21 1445  WBC 5.7  CREATININE 3.68*    Estimated Creatinine Clearance: 28.7 mL/min (A) (by C-G formula based on SCr of 3.68 mg/dL (H)).    Allergies  Allergen Reactions   Other Other (See Comments)    Blood pressure issues Per [facility] hospital: Pt states he can only take these pain meds or else he gets very sick: Oxycontin,morphine,demerol, and dilaudid are the only pain meds pt states he can take.     Oxymorphone Other (See Comments)    Causes kidney problems   Aspirin Nausea And Vomiting    Per [facility]   Beta Vulgaris Nausea And Vomiting    Beets   Buspirone Nausea And Vomiting and Other (See Comments)    Affected his head   Cabbage Nausea And Vomiting   Codeine Other (See Comments)    Unknown reaction   Dolobid [Diflunisal] Other (See Comments)    Unknown reaction   Fish Allergy Nausea And Vomiting   Fish-Derived Products Nausea And Vomiting   Methadone Other (See Comments)    Made him loopy   Pentazocine Other (See Comments)    Unknown reaction   Propoxyphene Nausea And Vomiting and Other (See Comments)    Messed with his head   Shellfish Allergy Nausea And Vomiting   Sulfa Antibiotics Hives   Sulfasalazine Other (See Comments)    Unknown reaction   Vistaril [Hydroxyzine] Nausea And Vomiting and Other (See Comments)    Didn't help   Amoxicillin Rash    Per [facility]    Thank you for allowing pharmacy to be a part of this patients care.  Eudelia Bunch, Pharm.D 12/07/2021 5:25 PM

## 2021-12-08 DIAGNOSIS — L03116 Cellulitis of left lower limb: Secondary | ICD-10-CM | POA: Diagnosis not present

## 2021-12-08 DIAGNOSIS — I82412 Acute embolism and thrombosis of left femoral vein: Secondary | ICD-10-CM

## 2021-12-08 DIAGNOSIS — S81802D Unspecified open wound, left lower leg, subsequent encounter: Secondary | ICD-10-CM

## 2021-12-08 LAB — COMPREHENSIVE METABOLIC PANEL
ALT: 11 U/L (ref 0–44)
AST: 19 U/L (ref 15–41)
Albumin: 3.1 g/dL — ABNORMAL LOW (ref 3.5–5.0)
Alkaline Phosphatase: 89 U/L (ref 38–126)
Anion gap: 8 (ref 5–15)
BUN: 64 mg/dL — ABNORMAL HIGH (ref 8–23)
CO2: 22 mmol/L (ref 22–32)
Calcium: 7.3 mg/dL — ABNORMAL LOW (ref 8.9–10.3)
Chloride: 108 mmol/L (ref 98–111)
Creatinine, Ser: 3.64 mg/dL — ABNORMAL HIGH (ref 0.61–1.24)
GFR, Estimated: 17 mL/min — ABNORMAL LOW (ref >60)
Glucose, Bld: 69 mg/dL — ABNORMAL LOW (ref 70–99)
Potassium: 4.8 mmol/L (ref 3.5–5.1)
Sodium: 138 mmol/L (ref 135–145)
Total Bilirubin: 0.4 mg/dL (ref 0.3–1.2)
Total Protein: 7.6 g/dL (ref 6.5–8.1)

## 2021-12-08 LAB — GLUCOSE, CAPILLARY
Glucose-Capillary: 77 mg/dL (ref 70–99)
Glucose-Capillary: 69 mg/dL — ABNORMAL LOW (ref 70–99)
Glucose-Capillary: 81 mg/dL (ref 70–99)
Glucose-Capillary: 107 mg/dL — ABNORMAL HIGH (ref 70–99)
Glucose-Capillary: 103 mg/dL — ABNORMAL HIGH (ref 70–99)

## 2021-12-08 LAB — CBC
HCT: 28.5 % — ABNORMAL LOW (ref 39.0–52.0)
Hemoglobin: 8.8 g/dL — ABNORMAL LOW (ref 13.0–17.0)
MCH: 31.3 pg (ref 26.0–34.0)
MCHC: 30.9 g/dL (ref 30.0–36.0)
MCV: 101.4 fL — ABNORMAL HIGH (ref 80.0–100.0)
Platelets: 172 10*3/uL (ref 150–400)
RBC: 2.81 MIL/uL — ABNORMAL LOW (ref 4.22–5.81)
RDW: 16 % — ABNORMAL HIGH (ref 11.5–15.5)
WBC: 5.3 10*3/uL (ref 4.0–10.5)
nRBC: 0 % (ref 0.0–0.2)

## 2021-12-08 LAB — APTT
aPTT: 51 seconds — ABNORMAL HIGH (ref 24–36)
aPTT: 52 seconds — ABNORMAL HIGH (ref 24–36)

## 2021-12-08 MED ORDER — POLYETHYLENE GLYCOL 3350 17 G PO PACK
17.0000 g | PACK | Freq: Every day | ORAL | Status: DC
  Administered 2021-12-08: 17 g via ORAL
  Filled 2021-12-08 (×6): qty 1

## 2021-12-08 MED ORDER — SENNOSIDES-DOCUSATE SODIUM 8.6-50 MG PO TABS
1.0000 | ORAL_TABLET | Freq: Two times a day (BID) | ORAL | Status: DC
  Administered 2021-12-08 – 2021-12-16 (×15): 1 via ORAL
  Filled 2021-12-08 (×16): qty 1

## 2021-12-08 MED ORDER — HEPARIN BOLUS VIA INFUSION
3500.0000 [IU] | Freq: Once | INTRAVENOUS | Status: AC
  Administered 2021-12-08: 3500 [IU] via INTRAVENOUS
  Filled 2021-12-08: qty 3500

## 2021-12-08 MED ORDER — FENTANYL CITRATE PF 50 MCG/ML IJ SOSY
12.5000 ug | PREFILLED_SYRINGE | INTRAMUSCULAR | Status: DC | PRN
  Administered 2021-12-08 – 2021-12-09 (×11): 12.5 ug via INTRAVENOUS
  Filled 2021-12-08 (×11): qty 1

## 2021-12-08 MED ORDER — OXYCODONE HCL ER 15 MG PO T12A
15.0000 mg | EXTENDED_RELEASE_TABLET | Freq: Two times a day (BID) | ORAL | Status: DC
  Administered 2021-12-08 – 2021-12-16 (×17): 15 mg via ORAL
  Filled 2021-12-08 (×17): qty 1

## 2021-12-08 MED ORDER — HEPARIN (PORCINE) 25000 UT/250ML-% IV SOLN
2100.0000 [IU]/h | INTRAVENOUS | Status: DC
  Administered 2021-12-08 – 2021-12-09 (×2): 2100 [IU]/h via INTRAVENOUS
  Filled 2021-12-08 (×2): qty 250

## 2021-12-08 MED ORDER — FENTANYL CITRATE PF 50 MCG/ML IJ SOSY
25.0000 ug | PREFILLED_SYRINGE | Freq: Once | INTRAMUSCULAR | Status: AC
  Administered 2021-12-08: 25 ug via INTRAVENOUS
  Filled 2021-12-08: qty 1

## 2021-12-08 MED ORDER — OXYCODONE HCL ER 30 MG PO T12A: 30.0000 mg | EXTENDED_RELEASE_TABLET | Freq: Two times a day (BID) | ORAL | Status: DC

## 2021-12-08 MED ORDER — GABAPENTIN 300 MG PO CAPS
300.0000 mg | ORAL_CAPSULE | Freq: Two times a day (BID) | ORAL | Status: DC
  Administered 2021-12-08 – 2021-12-10 (×5): 300 mg via ORAL
  Filled 2021-12-08 (×5): qty 1
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
## 2021-12-08 NOTE — Progress Notes (Signed)
2/18 2300-Upon assessment, left lower leg weeping. No wound care orders placed at this time. This RN wrapped patient's leg in kerlix and ABD pads loosely, then placed a bed pad under leg to catch any drainage.

## 2021-12-08 NOTE — Progress Notes (Signed)
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  Noted to be mildly disoriented in the ED. 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  Found to have ATN with a edematous and warm appearing left lower extremity started on antibiotics, heparin drip and admission requested for further management   Subjective:  C/o leg pain  Assessment & Plan:  Principal Problem:   Left leg cellulitis Active Problems:   [name], right (HCC)   [name], type 2) (HCC)   HTN (hypertension)   Chronic combined systolic and diastolic CHF (congestive heart failure) (HCC)   GERD (gastroesophageal reflux disease)   BPH (benign prostatic hyperplasia)   CKD (chronic kidney disease), stage IV (HCC)   Chronic pain syndrome   Physical deconditioning   DVT (deep venous thrombosis) (HCC)   Hypothyroidism    Assessment and Plan:  Left leg cellulitis: Patient with redness tenderness and weeping on left lower  extremity.   continue empiric antibiotics,  Current no evidence of sepsis   DVT Lt LE w/ more proximal propagation Hx of PE: Patient compliant on Eliquis as per report.   Unclear if Eliquis failure versus natural progression of DVT.  For now keeping on heparin drip  Seen by vascular surgery , no plan for intervention Vascular surgery recommend hematology consult, plan to call hematology tomorrow   AKI on CKD 4 versus progression of CKD IV: Baseline creatinine on last discharge 2.7 on 1/223, was 3.5 prior to that.  Received gentle hydration, Check UA  Repeat BMP in the morning, order renal ultrasound if no improvement in creatinine   [name], right: Has a right prosthesis and ambulates with walker at home lives alone   DM type 2 with recent admission for hypoglycemia in January: on sliding scale insulin. HLD: resume statin HTN: BP stable resume home meds   Chronic combined systolic and diastolic CHF: Appears on dry side to side with recent diarrhea.  Monitor volume status intake output   GERD: Continue PPI BPH: Continue Flomax and Proscar.   Chronic pain syndrome History of gout: Continue his OxyContin, Neurontin,    Hypothyroidism-resume Synthroid Anemia of chronic kidney disease hemoglobin at baseline.  Continue iron supplement and monitor   Physical deconditioning: PT OT eval, likely will need to SNF     .    Pressure Injury 10/18/21 Buttocks Left Stage 1 -  Intact skin with non-blanchable redness of a localized area usually over a bony prominence. slightly pink (Active)  10/18/21 1615  Location: Buttocks  Location Orientation: Left  Staging: Stage 1 -  Intact skin with non-blanchable redness of a localized area usually over a bony prominence.  Wound Description (Comments): slightly pink  Present on Admission: Yes        Skin Assessment: I have examined the patients skin and I agree with the wound assessment as performed by the wound care RN as outlined  below: Pressure Injury 10/18/21 Buttocks Left Stage 1 -  Intact skin with non-blanchable redness of a localized area usually over a bony prominence. slightly pink (Active)  10/18/21 1615  Location: Buttocks  Location Orientation: Left  Staging: Stage 1 -  Intact skin with non-blanchable redness of a localized area usually over a bony prominence.  Wound Description (Comments): slightly pink  Present on Admission: Yes     I have Reviewed nursing notes, Vitals, pain scores, I/o's, Lab results and  imaging results since pt's last encounter, details please see discussion above  I ordered the following labs:  Unresulted Labs (From admission, onward)     Start     Ordered   12/09/21 0500  CBC  Daily,   R     Comments: While on heparin drip only    12/07/21 1727   12/09/21 0500  Heparin level (unfractionated)  Daily,   R      12/08/21 1519   12/09/21 4128  Basic metabolic panel  Tomorrow morning,   R        12/08/21 1810   12/09/21 0000  APTT  Once,   R        12/08/21 1519   12/08/21 2223  Urinalysis, Routine w reflex microscopic  Once,   R        12/08/21 2222             DVT prophylaxis:   Heparin drip   Code Status:   Code Status: Full Code  Family Communication: Patient Disposition:   Status is: Inpatient  Dispo: The patient is from: Home, lives alone, brother lives close by              Anticipated d/c is to: Likely will need SNF              Anticipated d/c date is: TBD  Antimicrobials:    Anti-infectives (From admission, onward)    Start     Dose/Rate Route Frequency Ordered Stop   12/07/21 1800  ceFAZolin (ANCEF) IVPB 2g/100 mL premix        2 g 200 mL/hr over 30 Minutes Intravenous Every 12 hours 12/07/21 1654 12/14/21 1759   12/07/21 1630  ceFAZolin (ANCEF) IVPB 2g/100 mL premix  Status:  Discontinued        2 g 200 mL/hr over 30 Minutes Intravenous  Once 12/07/21 1627 12/07/21 1708          Objective: Vitals:   12/08/21 0438 12/08/21 0745  12/08/21 1257 12/08/21 2120  BP: (!) 143/61 (!) 148/77 (!) 151/79 (!) 182/92  Pulse: 72 72 77 77  Resp: (!) 24 16 18 16   Temp: 99.5 F (37.5 C) 99.4 F (37.4 C) 99.9 F (37.7 C) 98.1 F (36.7 C)  TempSrc: Oral Oral Oral Oral  SpO2: 92% 93% 98% 92%  Weight: 121.7 kg     Height:        Intake/Output Summary (Last 24 hours) at 12/08/2021 2224 Last data filed at 12/08/2021 1700 Gross per 24 hour  Intake 882.73 ml  Output 1625 ml  Net -742.27 ml   Filed Weights   12/07/21 1446 12/07/21 2120  12/08/21 0438  Weight: 126.8 kg 121.7 kg 121.7 kg    Examination:  General exam: alert, awake, communicative,calm, NAD Respiratory system: Clear to auscultation. Respiratory effort normal. Cardiovascular system:  RRR.  Gastrointestinal system: Abdomen is nondistended, soft and nontender.  Normal bowel sounds heard. Central nervous system: Alert and oriented. No focal neurological deficits. Extremities: Left lower extremity edematous, right BKA Skin: No rashes, lesions or ulcers Psychiatry: Judgement and insight appear normal. Mood & affect appropriate.     Data Reviewed: I have personally reviewed  labs and visualized  imaging studies since the last encounter and formulate the plan        Scheduled Meds:  gabapentin  300 mg Oral BID   insulin aspart  0-6 Units Subcutaneous TID WC   oxyCODONE  15 mg Oral Q12H   polyethylene glycol  17 g Oral Daily   senna-docusate  1 tablet Oral BID   Continuous Infusions:   ceFAZolin (ANCEF) IV 2 g (12/08/21 1729)   heparin 2,100 Units/hr (12/08/21 2149)     LOS: 1 day     Florencia Reasons, MD PhD FACP [facility]  Available via Epic secure chat 7am-7pm for nonurgent issues Please page for urgent issues To page the attending provider between 7A-7P or the covering provider during after hours 7P-7A, please log into the web site [email or web address] and access using universal [place] password for that web site. If you do not have the password,  please call the hospital operator.    12/08/2021, 10:24 PM

## 2021-12-08 NOTE — Progress Notes (Signed)
ANTICOAGULATION CONSULT NOTE - Initial Consult  Pharmacy Consult for heparin Indication: worsening DVT on apixaban  Allergies  Allergen Reactions   Other Other (See Comments)    Blood pressure issues Per [facility] hospital: Pt states he can only take these pain meds or else he gets very sick: Oxycontin,morphine,demerol, and dilaudid are the only pain meds pt states he can take.     Oxymorphone Other (See Comments)    Causes kidney problems   Aspirin Nausea And Vomiting    Per [facility]   Beta Vulgaris Nausea And Vomiting    Beets   Buspirone Nausea And Vomiting and Other (See Comments)    Affected his head   Cabbage Nausea And Vomiting   Codeine Other (See Comments)    Unknown reaction   Dolobid [Diflunisal] Other (See Comments)    Unknown reaction   Fish Allergy Nausea And Vomiting   Fish-Derived Products Nausea And Vomiting   Methadone Other (See Comments)    Made him loopy   Pentazocine Other (See Comments)    Unknown reaction   Propoxyphene Nausea And Vomiting and Other (See Comments)    Messed with his head   Shellfish Allergy Nausea And Vomiting   Sulfa Antibiotics Hives   Sulfasalazine Other (See Comments)    Unknown reaction   Vistaril [Hydroxyzine] Nausea And Vomiting and Other (See Comments)    Didn't help   Amoxicillin Rash    Per [facility]    Patient Measurements: Height: 6\' 6"  (198.1 cm) Weight: 121.7 kg (268 lb 5.5 oz) IBW/kg (Calculated) : 91.4 Heparin Dosing Weight: 116 kg  Vital Signs: Temp: 99.5 F (37.5 C) (02/19 0438) Temp Source: Oral (02/19 0438) BP: 143/61 (02/19 0438) Pulse Rate: 72 (02/19 0438)  Labs: Recent Labs    12/07/21 1445 12/07/21 1634 12/07/21 1745 12/08/21 0302  HGB 8.9*  --   --  8.8*  HCT 28.3*  --   --  28.5*  PLT 173  --   --  172  APTT  --  42*  --  51*  HEPARINUNFRC  --  >1.10*  --   --   CREATININE 3.68*  --   --  3.64*  CKTOTAL 180  --   --   --   TROPONINIHS 8  --  8  --      Estimated  Creatinine Clearance: 28.4 mL/min (A) (by C-G formula based on SCr of 3.64 mg/dL (H)).   Medical History: Past Medical History:  Diagnosis Date   Arthritis    BPH (benign prostatic hyperplasia) 11/24/2019   Chronic pain syndrome 11/24/2019   CKD (chronic kidney disease), stage IV ([place]) 11/24/2019   Diabetes mellitus without complication (HCC)    Gout    Hypertension    Muscle pain    Physical deconditioning 11/24/2019   Scars    Swelling     Assessment: Pharmacy consulted to dose heparin for DVT worsening on apixaban therapy.  On apixaban 5 mg po BID PTA- last dose today at 07 am per pt  12/08/21 aPTT = 51 sec (subtherapeutic) with heparin gtt @ 1600 units/hr Hgb = 8.8 (low - stable), Pltc WNL No complications of heparin therapy noted SCr = 3.64  Goal of Therapy:  Heparin level 0.3-0.7 units/ml aPTT 66-102 seconds Monitor platelets by anticoagulation protocol: Yes   Plan:  Increase heparin to 1800 units/hr  Check 8 hr aPTT after rate increase Daily aPTT/heparin level/CBC  Leone Haven, PharmD 12/08/2021 5:58 AM

## 2021-12-08 NOTE — Progress Notes (Signed)
[place] for heparin Indication: worsening DVT on apixaban  Allergies  Allergen Reactions   Other Other (See Comments)    Blood pressure issues Per [facility]: Pt states he can only take these pain meds or else he gets very sick: Oxycontin,morphine,demerol, and dilaudid are the only pain meds pt states he can take.     Oxymorphone Other (See Comments)    Causes kidney problems   Aspirin Nausea And Vomiting    Per [facility]   Beta Vulgaris Nausea And Vomiting    Beets   Buspirone Nausea And Vomiting and Other (See Comments)    Affected his head   Cabbage Nausea And Vomiting   Codeine Other (See Comments)    Unknown reaction   Dolobid [Diflunisal] Other (See Comments)    Unknown reaction   Fish Allergy Nausea And Vomiting   Fish-Derived Products Nausea And Vomiting   Methadone Other (See Comments)    Made him loopy   Pentazocine Other (See Comments)    Unknown reaction   Propoxyphene Nausea And Vomiting and Other (See Comments)    Messed with his head   Shellfish Allergy Nausea And Vomiting   Sulfa Antibiotics Hives   Sulfasalazine Other (See Comments)    Unknown reaction   Vistaril [Hydroxyzine] Nausea And Vomiting and Other (See Comments)    Didn't help   Amoxicillin Rash    Per [facility]    Patient Measurements: Height: 6\' 6"  (198.1 cm) Weight: 121.7 kg (268 lb 5.5 oz) IBW/kg (Calculated) : 91.4 Heparin Dosing Weight: 116 kg  Vital Signs: Temp: 99.9 F (37.7 C) (02/19 1257) Temp Source: Oral (02/19 1257) BP: 151/79 (02/19 1257) Pulse Rate: 77 (02/19 1257)  Labs: Recent Labs    12/07/21 1445 12/07/21 1634 12/07/21 1745 12/08/21 0302 12/08/21 1404  HGB 8.9*  --   --  8.8*  --   HCT 28.3*  --   --  28.5*  --   PLT 173  --   --  172  --   APTT  --  42*  --  51* 52*  HEPARINUNFRC  --  >1.10*  --   --   --   CREATININE 3.68*  --   --  3.64*  --   CKTOTAL 180  --   --   --   --   TROPONINIHS 8   --  8  --   --      Estimated Creatinine Clearance: 28.4 mL/min (A) (by C-G formula based on SCr of 3.64 mg/dL (H)).   Medical History: Past Medical History:  Diagnosis Date   Arthritis    BPH (benign prostatic hyperplasia) 11/24/2019   Chronic pain syndrome 11/24/2019   CKD (chronic kidney disease), stage IV ([place]) 11/24/2019   Diabetes mellitus without complication (HCC)    Gout    Hypertension    Muscle pain    Physical deconditioning 11/24/2019   Scars    Swelling     Assessment: Pharmacy consulted to dose heparin for DVT worsening on apixaban therapy.  On apixaban 5 mg po BID PTA- last dose today at 07 am per pt  12/08/21 afternoon,  aPTT = 52 sec (subtherapeutic) with heparin gtt @ 1800 units/hr Hgb = 8.8 (low - stable), Pltc WNL No complications of heparin therapy or bleeding per nurse SCr = 3.64  Goal of Therapy:  Heparin level 0.3-0.7 units/ml aPTT 66-102 seconds Monitor platelets by anticoagulation protocol: Yes   Plan:  [place]  3500 units IV heparin Increase heparin infusion to 2100 units/hr  Check 8 hr aPTT after rate increase Daily aPTT/heparin level/CBC  Royetta Asal, PharmD, [place] Please utilize Amion for appropriate phone number to reach the unit pharmacist ([place]) 12/08/2021 3:02 PM

## 2021-12-08 NOTE — Progress Notes (Signed)
RT inquired about CPAP. Pt states he never worn anything at home and refused CPAP tonight.

## 2021-12-09 ENCOUNTER — Inpatient Hospital Stay (HOSPITAL_COMMUNITY): Payer: Medicare Other

## 2021-12-09 DIAGNOSIS — I739 Peripheral vascular disease, unspecified: Secondary | ICD-10-CM | POA: Diagnosis not present

## 2021-12-09 DIAGNOSIS — L03116 Cellulitis of left lower limb: Secondary | ICD-10-CM | POA: Diagnosis not present

## 2021-12-09 DIAGNOSIS — L0390 Cellulitis, unspecified: Secondary | ICD-10-CM

## 2021-12-09 LAB — CBC
HCT: 29.7 % — ABNORMAL LOW (ref 39.0–52.0)
Hemoglobin: 9.4 g/dL — ABNORMAL LOW (ref 13.0–17.0)
MCH: 32 pg (ref 26.0–34.0)
MCHC: 31.6 g/dL (ref 30.0–36.0)
MCV: 101 fL — ABNORMAL HIGH (ref 80.0–100.0)
Platelets: 175 10*3/uL (ref 150–400)
RBC: 2.94 MIL/uL — ABNORMAL LOW (ref 4.22–5.81)
RDW: 16 % — ABNORMAL HIGH (ref 11.5–15.5)
WBC: 5.4 10*3/uL (ref 4.0–10.5)
nRBC: 0 % (ref 0.0–0.2)

## 2021-12-09 LAB — BASIC METABOLIC PANEL
Anion gap: 8 (ref 5–15)
BUN: 49 mg/dL — ABNORMAL HIGH (ref 8–23)
CO2: 23 mmol/L (ref 22–32)
Calcium: 7.5 mg/dL — ABNORMAL LOW (ref 8.9–10.3)
Chloride: 107 mmol/L (ref 98–111)
Creatinine, Ser: 3.02 mg/dL — ABNORMAL HIGH (ref 0.61–1.24)
GFR, Estimated: 22 mL/min — ABNORMAL LOW (ref >60)
Glucose, Bld: 98 mg/dL (ref 70–99)
Potassium: 4.9 mmol/L (ref 3.5–5.1)
Sodium: 138 mmol/L (ref 135–145)

## 2021-12-09 LAB — URINALYSIS, ROUTINE W REFLEX MICROSCOPIC
Bilirubin Urine: NEGATIVE
Glucose, UA: NEGATIVE mg/dL
Ketones, ur: NEGATIVE mg/dL
Nitrite: NEGATIVE
Protein, ur: 100 mg/dL — AB
Specific Gravity, Urine: 1.01 (ref 1.005–1.030)
pH: 6 (ref 5.0–8.0)

## 2021-12-09 LAB — APTT
aPTT: 68 seconds — ABNORMAL HIGH (ref 24–36)
aPTT: 66 seconds — ABNORMAL HIGH (ref 24–36)

## 2021-12-09 LAB — GLUCOSE, CAPILLARY
Glucose-Capillary: 114 mg/dL — ABNORMAL HIGH (ref 70–99)
Glucose-Capillary: 130 mg/dL — ABNORMAL HIGH (ref 70–99)
Glucose-Capillary: 114 mg/dL — ABNORMAL HIGH (ref 70–99)
Glucose-Capillary: 138 mg/dL — ABNORMAL HIGH (ref 70–99)

## 2021-12-09 LAB — HEPARIN LEVEL (UNFRACTIONATED): Heparin Unfractionated: >1.1 IU/mL — ABNORMAL HIGH (ref 0.30–0.70)

## 2021-12-09 MED ORDER — APIXABAN 5 MG PO TABS
5.0000 mg | ORAL_TABLET | Freq: Two times a day (BID) | ORAL | Status: DC
  Administered 2021-12-09: 5 mg via ORAL
  Filled 2021-12-09: qty 1

## 2021-12-09 MED ORDER — OXYCODONE HCL 5 MG PO TABS
10.0000 mg | ORAL_TABLET | ORAL | Status: DC | PRN
  Administered 2021-12-09 – 2021-12-16 (×20): 10 mg via ORAL
  Filled 2021-12-09 (×21): qty 2

## 2021-12-09 MED ORDER — CEFAZOLIN SODIUM-DEXTROSE 2-4 GM/100ML-% IV SOLN
2.0000 g | Freq: Three times a day (TID) | INTRAVENOUS | Status: DC
  Administered 2021-12-09 – 2021-12-10 (×3): 2 g via INTRAVENOUS
  Filled 2021-12-09 (×4): qty 100

## 2021-12-09 MED ORDER — HEPARIN (PORCINE) 25000 UT/250ML-% IV SOLN
2150.0000 [IU]/h | INTRAVENOUS | Status: AC
  Administered 2021-12-09: 2150 [IU]/h via INTRAVENOUS
  Filled 2021-12-09: qty 250

## 2021-12-09 MED ORDER — OXYCODONE HCL 5 MG PO TABS
5.0000 mg | ORAL_TABLET | ORAL | Status: DC | PRN
  Administered 2021-12-09: 5 mg via ORAL
  Filled 2021-12-09: qty 1

## 2021-12-09 NOTE — Progress Notes (Signed)
Orthopedic Tech Progress Note Patient Details:  Thomas [name] 1950-11-16 [id number]  Patient ID: Thomas [name], male   DOB: 1950-11-28, 71 y.o.   MRN: [id number]  Thomas [name] 12/09/2021, 2:38 PM Applied one box of unna paste and 2 coban rolls

## 2021-12-09 NOTE — Progress Notes (Signed)
ABI's have been completed. Preliminary results can be found in CV Proc through chart review.   12/09/21 10:22 AM Thomas [name] RVT

## 2021-12-09 NOTE — Consult Note (Signed)
[place] Nurse wound consult note Consultation was completed by review of records, images and assistance from the bedside nurse/clinical staff.   Reason for Consult:LLE Admitted with swelling and warmth LLE. 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 History of RLE BKA. Wound type: LLE PAD; full thickness ulceration left 4th toe 2. Venous stasis vs. Arterial full thickness and partial thickness skin loss  ** possible that toe and pretibial wounds are related to trauma. Patient dc to home to find it ransacked and patient has been at home using walker to try to clean up on his on.  Pressure Injury POA: NA Measurement: Toe tip ulceration: 0.5cm x 0.5cm x 0.2cm aprox; 100% clean, pink, moist Distal pretibial area; scabbed Wound bed: see above  Drainage (amount, consistency, odor) scant  Periwound:edema  Dressing procedure/placement/frequency:  Clean LLE pretibial wound and L toe wound with saline, pat dry.  Cover both areas with single layer of xeroform gauze. Top with dry dressing. Wrap from toes to knee with kerlix and ACE wrap with slight compression. Change daily   to Dr. Reesa Chew will order topical care for the LLE, however agree with vascular could benefit from therapeutic compression, however last ABI was in 2021 and with history of PAD I would feel better with new ABI results before using Unna's boots. Please advise. I will follow up for ABI results if you decide to proceed  Re consult if needed, will not follow at this time. Thanks  Leon Montoya [employer], RN,CWOCN, CNS, [place] [phone])

## 2021-12-09 NOTE — Progress Notes (Signed)
[place] for heparin Indication: worsening DVT on apixaban  Allergies  Allergen Reactions   Other Other (See Comments)    Blood pressure issues Per [facility] hospital: Pt states he can only take these pain meds or else he gets very sick: Oxycontin,morphine,demerol, and dilaudid are the only pain meds pt states he can take.     Oxymorphone Other (See Comments)    Causes kidney problems   Aspirin Nausea And Vomiting    Per [facility]   Beta Vulgaris Nausea And Vomiting    Beets   Buspirone Nausea And Vomiting and Other (See Comments)    Affected his head   Cabbage Nausea And Vomiting   Codeine Other (See Comments)    Unknown reaction   Dolobid [Diflunisal] Other (See Comments)    Unknown reaction   Fish Allergy Nausea And Vomiting   Fish-Derived Products Nausea And Vomiting   Methadone Other (See Comments)    Made him loopy   Pentazocine Other (See Comments)    Unknown reaction   Propoxyphene Nausea And Vomiting and Other (See Comments)    Messed with his head   Shellfish Allergy Nausea And Vomiting   Sulfa Antibiotics Hives   Sulfasalazine Other (See Comments)    Unknown reaction   Vistaril [Hydroxyzine] Nausea And Vomiting and Other (See Comments)    Didn't help   Amoxicillin Rash    Per [facility]    Patient Measurements: Height: 6\' 6"  (198.1 cm) Weight: 121.7 kg (268 lb 5.5 oz) IBW/kg (Calculated) : 91.4 Heparin Dosing Weight: 116 kg  Vital Signs: Temp: 98.2 F (36.8 C) (02/20 0400) Temp Source: Oral (02/20 0400) BP: 169/82 (02/20 0400) Pulse Rate: 81 (02/20 0400)  Labs: Recent Labs    12/07/21 1445 12/07/21 1445 12/07/21 1634 12/07/21 1745 12/08/21 0302 12/08/21 1404 12/09/21 0216 12/09/21 0935  HGB 8.9*  --   --   --  8.8*  --  9.4*  --   HCT 28.3*  --   --   --  28.5*  --  29.7*  --   PLT 173  --   --   --  172  --  175  --   APTT  --    < > 42*  --  51* 52* 68* 66*  HEPARINUNFRC  --   --   >1.10*  --   --   --  >1.10*  --   CREATININE 3.68*  --   --   --  3.64*  --  3.02*  --   CKTOTAL 180  --   --   --   --   --   --   --   TROPONINIHS 8  --   --  8  --   --   --   --    < > = values in this interval not displayed.     Estimated Creatinine Clearance: 34.3 mL/min (A) (by C-G formula based on SCr of 3.02 mg/dL (H)).  Assessment: Pharmacy consulted to dose heparin for DVT worsening on apixaban therapy.  On apixaban 5 mg po BID PTA - last dose 2/18 at 0730  12/09/21 afternoon,  aPTT = 66 sec (low-end therapeutic) with heparin gtt @ 2100 units/hr Heparin level > 1.1 (falsely elevated due to lingering effects of apixaban) Hgb = 9.4 (low - stable), Pltc WNL No complications of heparin therapy or bleeding per d/w RN SCr = 3.02  Goal of Therapy:  Heparin level 0.3-0.7  units/ml aPTT 66-102 seconds Monitor platelets by anticoagulation protocol: Yes   Plan:  Increase heparin infusion @ 2150 units/hr to prevent drop into subtherapeutic range Daily aPTT/heparin level/CBC  Peggyann Juba, PharmD, BCPS Pharmacy: [phone] 12/09/2021 10:27 AM

## 2021-12-09 NOTE — Progress Notes (Signed)
[place] for heparin >>Eliquis Indication: chronic DVT  Allergies  Allergen Reactions   Other Other (See Comments)    Blood pressure issues Per [facility] hospital: Pt states he can only take these pain meds or else he gets very sick: Oxycontin,morphine,demerol, and dilaudid are the only pain meds pt states he can take.     Oxymorphone Other (See Comments)    Causes kidney problems   Aspirin Nausea And Vomiting    Per [facility]   Beta Vulgaris Nausea And Vomiting    Beets   Buspirone Nausea And Vomiting and Other (See Comments)    Affected his head   Cabbage Nausea And Vomiting   Codeine Other (See Comments)    Unknown reaction   Dolobid [Diflunisal] Other (See Comments)    Unknown reaction   Fish Allergy Nausea And Vomiting   Fish-Derived Products Nausea And Vomiting   Methadone Other (See Comments)    Made him loopy   Pentazocine Other (See Comments)    Unknown reaction   Propoxyphene Nausea And Vomiting and Other (See Comments)    Messed with his head   Shellfish Allergy Nausea And Vomiting   Sulfa Antibiotics Hives   Sulfasalazine Other (See Comments)    Unknown reaction   Vistaril [Hydroxyzine] Nausea And Vomiting and Other (See Comments)    Didn't help   Amoxicillin Rash    Per [facility]    Patient Measurements: Height: 6\' 6"  (198.1 cm) Weight: 121.7 kg (268 lb 5.5 oz) IBW/kg (Calculated) : 91.4 Heparin Dosing Weight: 116 kg  Vital Signs: Temp: 99.1 F (37.3 C) (02/20 1511) Temp Source: Oral (02/20 1511) BP: 171/102 (02/20 1511) Pulse Rate: 90 (02/20 1511)  Labs: Recent Labs    12/07/21 1445 12/07/21 1445 12/07/21 1634 12/07/21 1745 12/08/21 0302 12/08/21 1404 12/09/21 0216 12/09/21 0935  HGB 8.9*  --   --   --  8.8*  --  9.4*  --   HCT 28.3*  --   --   --  28.5*  --  29.7*  --   PLT 173  --   --   --  172  --  175  --   APTT  --    < > 42*  --  51* 52* 68* 66*  HEPARINUNFRC  --   --   >1.10*  --   --   --  >1.10*  --   CREATININE 3.68*  --   --   --  3.64*  --  3.02*  --   CKTOTAL 180  --   --   --   --   --   --   --   TROPONINIHS 8  --   --  8  --   --   --   --    < > = values in this interval not displayed.     Estimated Creatinine Clearance: 34.3 mL/min (A) (by C-G formula based on SCr of 3.02 mg/dL (H)).  Assessment: Pharmacy consulted to dose heparin for DVT worsening on apixaban therapy.  On apixaban 5 mg po BID PTA - last dose 2/18 at 0730  MD requested now switch back to apixaban  12/09/21 afternoon,  Hgb = 9.4 (low - stable), Pltc WNL No complications of heparin therapy or bleeding per d/w RN SCr = 3.02  Goal of Therapy:  Heparin level 0.3-0.7 units/ml aPTT 66-102 seconds Monitor platelets by anticoagulation protocol: Yes   Plan:  Resume Eliquis 5mg   po BID D/C heparin & daily labs once Eliquis dose given Monitor for s/sx of bleeding   Thomas [name], PharmD, BCPS Pharmacy: [phone] 12/09/2021 3:42 PM

## 2021-12-09 NOTE — Consult Note (Addendum)
WOC discussed application of Unna's boots with hospitalist and vascular MD.  Proceeded with orders for Unna's boot application, topical wound care orders are in the computer from earlier today.   Will need Unna's boots 2x wk change.  And HHRN to continue to provide care at home or follow up in a wound care center of patient's choice.    Re consult if needed, will not follow at this time. Thanks  Liza Czerwinski [employer], RN,CWOCN, CNS, CWON-AP [phone])   Addendum:  Contacted by patient's bedside nurse, patient refused Unna's boots and removed them himself after ortho tech placed.    [place], [place], [place]

## 2021-12-09 NOTE — Progress Notes (Signed)
Chaplain tried to engage in an initial visit but Thomas [name] was fast asleep.    Chaplain will follow-up.    12/09/21 1200  Clinical Encounter Type  Visited With Patient not available  Visit Type Initial;Social support

## 2021-12-09 NOTE — Evaluation (Signed)
Physical Therapy Evaluation Patient Details Name: Thomas [name] MRN: [id number] DOB: 12-31-1950 Today's Date: 12/09/2021  History of Present Illness  71 yo male admitted with L LE cellulitis, worsening L LE DVT. Hx of chronic pain, back sg, DVT, PE, DM, CKD, R BKA, HF, gout  Clinical Impression  On eval, pt was Mod Ind with bed mobility. He sat EOB for ~10 minutes without LOB. He c/o significant back and L LE pain during session. RN in to remove [employer] dressing on L leg at start of session (pt request after he threatened to remove it himself). Discussed d/c plan-pt plans to return home. Will continue to follow and progress activity as tolerated.      Recommendations for follow up therapy are one component of a multi-disciplinary discharge planning process, led by the attending physician.  Recommendations may be updated based on patient status, additional functional criteria and insurance authorization.  Follow Up Recommendations Home health PT (pt is adamant about returning home)    Assistance Recommended at Discharge PRN  Patient can return home with the following  A little help with walking and/or transfers;A little help with bathing/dressing/bathroom;Assist for transportation;Help with stairs or ramp for entrance    Equipment Recommendations None recommended by PT  Recommendations for Other Services       Functional Status Assessment Patient has had a recent decline in their functional status and demonstrates the ability to make significant improvements in function in a reasonable and predictable amount of time.     Precautions / Restrictions Precautions Precautions: Fall Precaution Comments: R BKA-has prosthesis Restrictions Weight Bearing Restrictions: No      Mobility  Bed Mobility Overal bed mobility: Modified Independent Bed Mobility: Supine to Sit, Sit to Supine     Supine to sit: Modified independent (Device/Increase time) Sit to supine: Modified independent  (Device/Increase time)   General bed mobility comments: RN in removing Unna boot at pt's request. Once finished, pt was able to get to EOB without assistance and with increased time. Pt sat EOB for ~10 minutes. He c/o pain along his entire spine and in L LE    Transfers                   General transfer comment: Deferred on today 2* pain and pharmacy still making adjustments to anticoagulation medicine    Ambulation/Gait                  Stairs            Wheelchair Mobility    Modified Rankin (Stroke Patients Only)       Balance Overall balance assessment: Needs assistance Sitting-balance support:  (L foot supported) Sitting balance-[name]: Good Sitting balance - Comments: No LOB with UE support                                     Pertinent Vitals/Pain Pain Assessment Pain Assessment: Faces Faces Pain Scale: Hurts whole lot Pain Location: entire spine, L LE Pain Descriptors / Indicators: Discomfort, Sore Pain Intervention(s): Limited activity within patient's tolerance, Repositioned    Home Living Family/patient expects to be discharged to:: Private residence Living Arrangements: Alone Available Help at Discharge: Available PRN/intermittently Type of Home: House Home Access: Stairs to enter Entrance Stairs-Rails: None Entrance Stairs-Number of Steps: 1   Home Layout: One level Home Equipment: Conservation officer, nature (2 wheels);Cane - single point;Crutches  Prior Function Prior Level of Function : Needs assist             Mobility Comments: pt uses prosthesis and RW for mobility in home.  he is performing all ADLs. he takes sink baths rather than showers due to no seat in the shower. He does report he has not driven since last admisson 10/2021       Hand Dominance   Dominant Hand: Right    Extremity/Trunk Assessment   Upper Extremity Assessment Upper Extremity Assessment: Defer to OT evaluation    Lower  Extremity Assessment Lower Extremity Assessment: Generalized weakness    Cervical / Trunk Assessment Cervical / Trunk Assessment: Normal  Communication   Communication: No difficulties  Cognition Arousal/Alertness: Awake/alert Behavior During Therapy: WFL for tasks assessed/performed Overall Cognitive Status: Within Functional Limits for tasks assessed                                          General Comments      Exercises     Assessment/Plan    PT Assessment Patient needs continued PT services  PT Problem List Decreased strength;Decreased mobility;Decreased activity tolerance;Decreased balance;Decreased knowledge of use of DME;Pain;Obesity       PT Treatment Interventions DME instruction;Gait training;Therapeutic exercise;Balance training;Therapeutic activities;Patient/family education;Functional mobility training    PT Goals (Current goals can be found in the Care Plan section)  Acute Rehab PT Goals Patient Stated Goal: less pain. L leg wound better PT Goal Formulation: With patient Time For Goal Achievement: 12/23/21 Potential to Achieve Goals: Fair    Frequency Min 3X/week     Co-evaluation               AM-PAC PT "6 Clicks" Mobility  Outcome Measure Help needed turning from your back to your side while in a flat bed without using bedrails?: None Help needed moving from lying on your back to sitting on the side of a flat bed without using bedrails?: None Help needed moving to and from a bed to a chair (including a wheelchair)?: A Little Help needed standing up from a chair using your arms (e.g., wheelchair or bedside chair)?: A Little Help needed to walk in hospital room?: A Little Help needed climbing 3-5 steps with a railing? : A Lot 6 Click Score: 19    End of Session   Activity Tolerance: Patient limited by pain Patient left: in bed;with call bell/phone within reach   PT Visit Diagnosis: Muscle weakness (generalized)  (M62.81);Pain;Difficulty in walking, not elsewhere classified (R26.2) Pain - part of body:  (back, L LE)    Time: 8527-7824 PT Time Calculation (min) (ACUTE ONLY): 19 min   Charges:   PT Evaluation $PT Eval Moderate Complexity: 1 Mod            Doreatha Massed, PT Acute Rehabilitation  Office: [phone] Pager: [phone]

## 2021-12-09 NOTE — Progress Notes (Signed)
Orthopedic Tech Progress Note Patient Details:  Thomas [name] 08/17/1951 [id number]  Ortho Devices Type of Ortho Device: Haematologist Ortho Device/Splint Location: left Ortho Device/Splint Interventions: Application   Post Interventions Patient Tolerated: Well Instructions Provided: Care of device  Thomas [name] 12/09/2021, 2:37 PM

## 2021-12-09 NOTE — Progress Notes (Addendum)
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  Noted to be mildly disoriented in the ED. 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  Found to have ATN with a edematous and warm appearing left lower extremity started on antibiotics, heparin drip and admission requested for further management  2/20: Fentanyl injections were stopped and added immediate release oxy for breakthrough pain.  Patient refused Unna boot and took them of shortly after placement.  Subjective: Patient was complaining of pain in hip and left leg.  Assessment & Plan:  Principal Problem:   Left leg cellulitis Active Problems:   [name], right (HCC)   [name], type 2) (HCC)   HTN (hypertension)   Chronic combined systolic and diastolic CHF (congestive heart failure) (HCC)   GERD (gastroesophageal reflux disease)   BPH (benign prostatic hyperplasia)   CKD (chronic kidney disease), stage IV (HCC)   Chronic pain  syndrome   Physical deconditioning   DVT (deep venous thrombosis) (HCC)   Hypothyroidism    Assessment and Plan:  Left leg cellulitis: Patient with redness tenderness and weeping on left lower extremity.   continue empiric antibiotics,  Current no evidence of sepsis   DVT Lt LE w/ more proximal propagation Hx of PE: Patient compliant on Eliquis as per report.   Unclear if Eliquis failure versus natural progression of DVT.  Patient was placed on heparin infusion for concern of any procedure by vascular surgery.  Vascular surgery is recommending conservative management with Unna boot followed by compression stockings and wound care. Vascular surgery also recommend hematology consult,-can follow-up as an outpatient -Restart home Eliquis-renally dosed and stop heparin infusion as there is no plan for any surgical intervention.   AKI on CKD 4 versus progression of CKD IV: Baseline creatinine on last discharge 2.7 on 1/223, was 3.5 prior to that.  Some improvement to 3.02 today. Received gentle hydration, -Monitor renal function -Avoid nephrotoxins  [name], right: Has a right prosthesis and ambulates with walker at home lives alone   DM type 2 with recent admission for hypoglycemia in January: on sliding scale insulin.  HLD: resume statin  HTN: Blood pressure mildly elevated.  Also complaining of pain. -Continue home meds   Chronic combined systolic and diastolic CHF: Appears on dry side to side with recent diarrhea.  Monitor volume status intake output   GERD: Continue PPI  BPH: Continue Flomax and Proscar.   Chronic pain syndrome History of gout: Continue his OxyContin, Neurontin, -Add immediate release oxycodone due to uncontrolled pain   Hypothyroidism-resume Synthroid  Anemia of  chronic kidney disease hemoglobin at baseline.  Continue iron supplement and monitor    Physical deconditioning: PT OT eval, likely will need to SNF    Pressure Injury 10/18/21 Buttocks  Left Stage 1 -  Intact skin with non-blanchable redness of a localized area usually over a bony prominence. slightly pink (Active)  10/18/21 1615  Location: Buttocks  Location Orientation: Left  Staging: Stage 1 -  Intact skin with non-blanchable redness of a localized area usually over a bony prominence.  Wound Description (Comments): slightly pink  Present on Admission: Yes    Skin Assessment: I have examined the patients skin and I agree with the wound assessment as performed by the wound care RN as outlined below: Pressure Injury 10/18/21 Buttocks Left Stage 1 -  Intact skin with non-blanchable redness of a localized area usually over a bony prominence. slightly pink (Active)  10/18/21 1615  Location: Buttocks  Location Orientation: Left  Staging: Stage 1 -  Intact skin with non-blanchable redness of a localized area usually over a bony prominence.  Wound Description (Comments): slightly pink  Present on Admission: Yes     I have Reviewed nursing notes, Vitals, pain scores, I/o's, Lab results and  imaging results since pt's last encounter, details please see discussion above  I ordered the following labs:  Unresulted Labs (From admission, onward)     Start     Ordered   12/10/21 0500  APTT  Daily,   R      12/09/21 1033   12/09/21 0500  CBC  Daily,   R     Comments: While on heparin drip only    12/07/21 1727   12/09/21 0500  Heparin level (unfractionated)  Daily,   R      12/08/21 1519             DVT prophylaxis:   Heparin drip-we will switch to Eliquis today   Code Status:   Code Status: Full Code  Family Communication:  Disposition:   Status is: Inpatient  Dispo: The patient is from: Home, lives alone, brother lives close by              Anticipated d/c is to: Likely will need SNF              Anticipated d/c date is: TBD  Antimicrobials:    Anti-infectives (From admission, onward)    Start     Dose/Rate Route Frequency Ordered Stop   12/09/21  1400  ceFAZolin (ANCEF) IVPB 2g/100 mL premix        2 g 200 mL/hr over 30 Minutes Intravenous Every 8 hours 12/09/21 0706 12/14/21 1359   12/07/21 1800  ceFAZolin (ANCEF) IVPB 2g/100 mL premix  Status:  Discontinued        2 g 200 mL/hr over 30 Minutes Intravenous Every 12 hours 12/07/21 1654 12/09/21 0706   12/07/21 1630  ceFAZolin (ANCEF) IVPB 2g/100 mL premix  Status:  Discontinued        2 g 200 mL/hr over 30 Minutes Intravenous  Once 12/07/21 1627 12/07/21 1708       Objective: Vitals:   12/08/21 1257 12/08/21 2120 12/09/21 0400 12/09/21 1511  BP: (!) 151/79 (!) 182/92 (!) 169/82 (!) 171/102  Pulse: 77 77 81 90  Resp: 18 16 20 20   Temp: 99.9 F (37.7 C) 98.1 F (36.7 C) 98.2 F (36.8 C) 99.1 F (37.3 C)  TempSrc: Oral Oral Oral Oral  SpO2: 98% 92% 91% 93%  Weight:      Height:        Intake/Output Summary (Last 24 hours) at 12/09/2021 1512 Last data filed at 12/09/2021 1222 Gross per 24 hour  Intake 1684.22 ml  Output 1400 ml  Net 284.22 ml    Filed Weights   12/07/21 1446 12/07/21 2120 12/08/21 0438  Weight: 126.8 kg 121.7 kg 121.7 kg    Examination:  General.  Well-developed gentleman, in no acute distress. Pulmonary.  Lungs clear bilaterally, normal respiratory effort. CV.  Regular rate and rhythm, no JVD, rub or murmur. Abdomen.  Soft, nontender, nondistended, BS positive. CNS.  Alert and oriented .  No focal neurologic deficit. Extremities.  Right BKA, left lower extremity with signs of chronic venous congestion, clean bandage and some oozing. Psychiatry.  Judgment and insight appears normal.     Data Reviewed: I have personally reviewed  labs and visualized  imaging studies since the last encounter and formulate the plan   Scheduled Meds:  gabapentin  300 mg Oral BID   insulin aspart  0-6 Units Subcutaneous TID WC   oxyCODONE  15 mg Oral Q12H   polyethylene glycol  17 g Oral Daily   senna-docusate  1 tablet Oral BID   Continuous Infusions:    ceFAZolin (ANCEF) IV     heparin 2,150 Units/hr (12/09/21 1036)     LOS: 2 days   This record has been created using Systems analyst. Errors have been sought and corrected,but may not always be located. Such creation errors do not reflect on the standard of care.   Lorella Nimrod, MD  [facility]  Available via Epic secure chat 7am-7pm for nonurgent issues Please page for urgent issues To page the attending provider between 7A-7P or the covering provider during after hours 7P-7A, please log into the web site [email or web address] and access using universal [place] password for that web site. If you do not have the password, please call the hospital operator.    12/09/2021, 3:12 PM

## 2021-12-09 NOTE — Progress Notes (Signed)
PHARMACY NOTE:  ANTIMICROBIAL RENAL DOSAGE ADJUSTMENT  Current antimicrobial regimen includes a mismatch between antimicrobial dosage and estimated renal function.  As per policy approved by the Pharmacy & Therapeutics and Medical Executive Committees, the antimicrobial dosage will be adjusted accordingly.  Current antimicrobial dosage:  cefazolin 2g IV q12h  Indication: cellulitis  Renal Function:  Estimated Creatinine Clearance: 34.3 mL/min (A) (by C-G formula based on SCr of 3.02 mg/dL (H)). []      On intermittent HD, scheduled: []      On CRRT    Antimicrobial dosage has been changed to:  2g IV q8h  Additional comments:   Thank you for allowing pharmacy to be a part of this patient's care.  Peggyann Juba, PharmD, BCPS Pharmacy: [phone] 12/09/2021 7:07 AM

## 2021-12-09 NOTE — Progress Notes (Signed)
Thomas [name] for heparin Indication: worsening DVT on apixaban  Allergies  Allergen Reactions   Other Other (See Comments)    Blood pressure issues Per [facility] hospital: Pt states he can only take these pain meds or else he gets very sick: Oxycontin,morphine,demerol, and dilaudid are the only pain meds pt states he can take.     Oxymorphone Other (See Comments)    Causes kidney problems   Aspirin Nausea And Vomiting    Per [facility]   Beta Vulgaris Nausea And Vomiting    Beets   Buspirone Nausea And Vomiting and Other (See Comments)    Affected his head   Cabbage Nausea And Vomiting   Codeine Other (See Comments)    Unknown reaction   Dolobid [Diflunisal] Other (See Comments)    Unknown reaction   Fish Allergy Nausea And Vomiting   Fish-Derived Products Nausea And Vomiting   Methadone Other (See Comments)    Made him loopy   Pentazocine Other (See Comments)    Unknown reaction   Propoxyphene Nausea And Vomiting and Other (See Comments)    Messed with his head   Shellfish Allergy Nausea And Vomiting   Sulfa Antibiotics Hives   Sulfasalazine Other (See Comments)    Unknown reaction   Vistaril [Hydroxyzine] Nausea And Vomiting and Other (See Comments)    Didn't help   Amoxicillin Rash    Per [facility]    Patient Measurements: Height: 6\' 6"  (198.1 cm) Weight: 121.7 kg (268 lb 5.5 oz) IBW/kg (Calculated) : 91.4 Heparin Dosing Weight: 116 kg  Vital Signs: Temp: 98.1 F (36.7 C) (02/19 2120) Temp Source: Oral (02/19 2120) BP: 182/92 (02/19 2120) Pulse Rate: 77 (02/19 2120)  Labs: Recent Labs    12/07/21 1445 12/07/21 1445 12/07/21 1634 12/07/21 1745 12/08/21 0302 12/08/21 1404 12/09/21 0216  HGB 8.9*  --   --   --  8.8*  --  9.4*  HCT 28.3*  --   --   --  28.5*  --  29.7*  PLT 173  --   --   --  172  --  175  APTT  --    < > 42*  --  51* 52* 68*  HEPARINUNFRC  --   --  >1.10*  --   --   --  >1.10*   CREATININE 3.68*  --   --   --  3.64*  --  3.02*  CKTOTAL 180  --   --   --   --   --   --   TROPONINIHS 8  --   --  8  --   --   --    < > = values in this interval not displayed.     Estimated Creatinine Clearance: 34.3 mL/min (A) (by C-G formula based on SCr of 3.02 mg/dL (H)).   Medical History: Past Medical History:  Diagnosis Date   Arthritis    BPH (benign prostatic hyperplasia) 11/24/2019   Chronic pain syndrome 11/24/2019   CKD (chronic kidney disease), stage IV ([place]) 11/24/2019   Diabetes mellitus without complication (HCC)    Gout    Hypertension    Muscle pain    Physical deconditioning 11/24/2019   Scars    Swelling     Assessment: Pharmacy consulted to dose heparin for DVT worsening on apixaban therapy.  On apixaban 5 mg po BID PTA  12/09/21 afternoon,  aPTT = 68 sec (therapeutic) with heparin gtt @ 2100  units/hr Heparin level > 1.1 (falsely elevated due to lingering effects of apixaban) Hgb = 9.4 (low - stable), Pltc WNL No complications of heparin therapy or bleeding per nurse SCr = 3.02  Goal of Therapy:  Heparin level 0.3-0.7 units/ml aPTT 66-102 seconds Monitor platelets by anticoagulation protocol: Yes   Plan:  Continue heparin infusion @ 2100 units/hr  Recheck aPTT in 8 hr to confirm therapeutic dose Daily aPTT/heparin level/CBC  Leone Haven, PharmD 12/09/2021 3:58 AM

## 2021-12-10 ENCOUNTER — Other Ambulatory Visit: Payer: Self-pay | Admitting: Oncology

## 2021-12-10 DIAGNOSIS — E669 Obesity, unspecified: Secondary | ICD-10-CM

## 2021-12-10 DIAGNOSIS — E66811 Obesity, class 1: Secondary | ICD-10-CM

## 2021-12-10 DIAGNOSIS — L03116 Cellulitis of left lower limb: Secondary | ICD-10-CM | POA: Diagnosis not present

## 2021-12-10 DIAGNOSIS — I82409 Acute embolism and thrombosis of unspecified deep veins of unspecified lower extremity: Secondary | ICD-10-CM

## 2021-12-10 LAB — GLUCOSE, CAPILLARY
Glucose-Capillary: 112 mg/dL — ABNORMAL HIGH (ref 70–99)
Glucose-Capillary: 110 mg/dL — ABNORMAL HIGH (ref 70–99)
Glucose-Capillary: 114 mg/dL — ABNORMAL HIGH (ref 70–99)
Glucose-Capillary: 191 mg/dL — ABNORMAL HIGH (ref 70–99)

## 2021-12-10 LAB — MAGNESIUM: Magnesium: 1.8 mg/dL (ref 1.7–2.4)

## 2021-12-10 LAB — BASIC METABOLIC PANEL
Anion gap: 9 (ref 5–15)
BUN: 35 mg/dL — ABNORMAL HIGH (ref 8–23)
CO2: 23 mmol/L (ref 22–32)
Calcium: 8.2 mg/dL — ABNORMAL LOW (ref 8.9–10.3)
Chloride: 104 mmol/L (ref 98–111)
Creatinine, Ser: 2.63 mg/dL — ABNORMAL HIGH (ref 0.61–1.24)
GFR, Estimated: 26 mL/min — ABNORMAL LOW (ref >60)
Glucose, Bld: 115 mg/dL — ABNORMAL HIGH (ref 70–99)
Potassium: 4.5 mmol/L (ref 3.5–5.1)
Sodium: 136 mmol/L (ref 135–145)

## 2021-12-10 LAB — CBC
HCT: 34.7 % — ABNORMAL LOW (ref 39.0–52.0)
Hemoglobin: 10.5 g/dL — ABNORMAL LOW (ref 13.0–17.0)
MCH: 31.6 pg (ref 26.0–34.0)
MCHC: 30.3 g/dL (ref 30.0–36.0)
MCV: 104.5 fL — ABNORMAL HIGH (ref 80.0–100.0)
Platelets: 178 10*3/uL (ref 150–400)
RBC: 3.32 MIL/uL — ABNORMAL LOW (ref 4.22–5.81)
RDW: 15.7 % — ABNORMAL HIGH (ref 11.5–15.5)
WBC: 5.7 10*3/uL (ref 4.0–10.5)
nRBC: 0 % (ref 0.0–0.2)

## 2021-12-10 LAB — HEPARIN LEVEL (UNFRACTIONATED): Heparin Unfractionated: >1.1 IU/mL — ABNORMAL HIGH (ref 0.30–0.70)

## 2021-12-10 LAB — APTT: aPTT: 63 seconds — ABNORMAL HIGH (ref 24–36)

## 2021-12-10 MED ORDER — PANTOPRAZOLE SODIUM 40 MG PO TBEC
40.0000 mg | DELAYED_RELEASE_TABLET | Freq: Two times a day (BID) | ORAL | Status: DC
Start: 2021-12-10 — End: 2021-12-10

## 2021-12-10 MED ORDER — CEPHALEXIN 500 MG PO CAPS
500.0000 mg | ORAL_CAPSULE | Freq: Four times a day (QID) | ORAL | Status: AC
  Administered 2021-12-10 – 2021-12-14 (×16): 500 mg via ORAL
  Filled 2021-12-10 (×16): qty 1

## 2021-12-10 MED ORDER — FERROUS SULFATE 325 (65 FE) MG PO TABS
325.0000 mg | ORAL_TABLET | Freq: Every day | ORAL | Status: DC
  Administered 2021-12-11 – 2021-12-16 (×6): 325 mg via ORAL
  Filled 2021-12-10 (×6): qty 1

## 2021-12-10 MED ORDER — METHOCARBAMOL 500 MG PO TABS
750.0000 mg | ORAL_TABLET | Freq: Three times a day (TID) | ORAL | Status: DC | PRN
  Administered 2021-12-10 – 2021-12-15 (×8): 750 mg via ORAL
  Filled 2021-12-10 (×8): qty 2

## 2021-12-10 MED ORDER — AMLODIPINE BESYLATE 5 MG PO TABS
5.0000 mg | ORAL_TABLET | Freq: Every day | ORAL | Status: DC
  Administered 2021-12-10 – 2021-12-16 (×7): 5 mg via ORAL
  Filled 2021-12-10 (×7): qty 1

## 2021-12-10 MED ORDER — FINASTERIDE 5 MG PO TABS
5.0000 mg | ORAL_TABLET | Freq: Every day | ORAL | Status: DC
Start: 2021-12-10 — End: 2021-12-16
  Administered 2021-12-10 – 2021-12-16 (×7): 5 mg via ORAL
  Filled 2021-12-10 (×7): qty 1

## 2021-12-10 MED ORDER — METOPROLOL TARTRATE 50 MG PO TABS: 50.0000 mg | ORAL_TABLET | Freq: Every day | ORAL | Status: DC

## 2021-12-10 MED ORDER — WARFARIN SODIUM 5 MG PO TABS
7.5000 mg | ORAL_TABLET | Freq: Once | ORAL | Status: AC
  Administered 2021-12-10: 7.5 mg via ORAL
  Filled 2021-12-10: qty 1

## 2021-12-10 MED ORDER — WARFARIN - PHARMACIST DOSING INPATIENT: Freq: Every day | Status: DC

## 2021-12-10 MED ORDER — TAMSULOSIN HCL 0.4 MG PO CAPS
0.4000 mg | ORAL_CAPSULE | Freq: Every day | ORAL | Status: DC
  Administered 2021-12-10 – 2021-12-16 (×7): 0.4 mg via ORAL
  Filled 2021-12-10 (×7): qty 1

## 2021-12-10 MED ORDER — BISACODYL 10 MG RE SUPP
10.0000 mg | Freq: Every day | RECTAL | Status: DC
Start: 2021-12-10 — End: 2021-12-14
  Administered 2021-12-10: 10 mg via RECTAL
  Filled 2021-12-10 (×2): qty 1

## 2021-12-10 MED ORDER — ALLOPURINOL 100 MG PO TABS
100.0000 mg | ORAL_TABLET | Freq: Every day | ORAL | Status: DC
  Administered 2021-12-10 – 2021-12-16 (×7): 100 mg via ORAL
  Filled 2021-12-10 (×7): qty 1

## 2021-12-10 MED ORDER — COUMADIN BOOK
Freq: Once | Status: AC
  Filled 2021-12-10: qty 1

## 2021-12-10 MED ORDER — CLONIDINE HCL 0.1 MG PO TABS
0.1000 mg | ORAL_TABLET | Freq: Three times a day (TID) | ORAL | Status: DC
  Administered 2021-12-10 – 2021-12-16 (×18): 0.1 mg via ORAL
  Filled 2021-12-10 (×18): qty 1

## 2021-12-10 MED ORDER — METOPROLOL TARTRATE 5 MG/5ML IV SOLN
5.0000 mg | INTRAVENOUS | Status: AC
Start: 2021-12-10 — End: 2021-12-10
  Administered 2021-12-10: 5 mg via INTRAVENOUS
  Filled 2021-12-10: qty 5

## 2021-12-10 MED ORDER — HEPARIN (PORCINE) 25000 UT/250ML-% IV SOLN
2300.0000 [IU]/h | INTRAVENOUS | Status: DC
  Administered 2021-12-10 – 2021-12-12 (×3): 2300 [IU]/h via INTRAVENOUS
  Filled 2021-12-10 (×5): qty 250

## 2021-12-10 MED ORDER — COLCHICINE 0.6 MG PO TABS
0.6000 mg | ORAL_TABLET | Freq: Every day | ORAL | Status: DC
  Administered 2021-12-10 – 2021-12-16 (×7): 0.6 mg via ORAL
  Filled 2021-12-10 (×7): qty 1

## 2021-12-10 MED ORDER — LEVOTHYROXINE SODIUM 50 MCG PO TABS
50.0000 ug | ORAL_TABLET | Freq: Every day | ORAL | Status: DC
  Administered 2021-12-11 – 2021-12-16 (×6): 50 ug via ORAL
  Filled 2021-12-10 (×6): qty 1

## 2021-12-10 MED ORDER — HEPARIN (PORCINE) 25000 UT/250ML-% IV SOLN
2150.0000 [IU]/h | INTRAVENOUS | Status: DC
  Administered 2021-12-10: 2150 [IU]/h via INTRAVENOUS
  Filled 2021-12-10 (×2): qty 250

## 2021-12-10 MED ORDER — GABAPENTIN 300 MG PO CAPS
300.0000 mg | ORAL_CAPSULE | Freq: Three times a day (TID) | ORAL | Status: DC
  Administered 2021-12-10 – 2021-12-16 (×18): 300 mg via ORAL
  Filled 2021-12-10 (×18): qty 1

## 2021-12-10 MED ORDER — METOPROLOL TARTRATE 50 MG PO TABS
50.0000 mg | ORAL_TABLET | Freq: Every day | ORAL | Status: DC
  Administered 2021-12-10 – 2021-12-16 (×7): 50 mg via ORAL
  Filled 2021-12-10 (×7): qty 1

## 2021-12-10 NOTE — Assessment & Plan Note (Addendum)
AKI on CKD stage IV versus progression of CKD stage IV: baseline creatinine on last discharge 2.7 on 1/2, was 3.5 prior to that. Currently at 2.6, stable. Avoid nephrotoxic medication.  Monitor Recent Labs  Lab 12/09/21 0216 12/10/21 1603  BUN 49* 35*  CREATININE 3.02* 2.63*

## 2021-12-10 NOTE — Assessment & Plan Note (Addendum)
cont home colchicine and allopurinol.

## 2021-12-10 NOTE — Progress Notes (Signed)
[place] for Eliquis >> Heparin/Warfarin Indication: chronic DVT, possible Eliquis failure  Allergies  Allergen Reactions   Other Other (See Comments)    Blood pressure issues Per [facility] hospital: Pt states he can only take these pain meds or else he gets very sick: Oxycontin,morphine,demerol, and dilaudid are the only pain meds pt states he can take.     Oxymorphone Other (See Comments)    Causes kidney problems   Aspirin Nausea And Vomiting    Per [facility]   Beta Vulgaris Nausea And Vomiting    Beets   Buspirone Nausea And Vomiting and Other (See Comments)    Affected his head   Cabbage Nausea And Vomiting   Codeine Other (See Comments)    Unknown reaction   Dolobid [Diflunisal] Other (See Comments)    Unknown reaction   Fish Allergy Nausea And Vomiting   Fish-Derived Products Nausea And Vomiting   Methadone Other (See Comments)    Made him loopy   Pentazocine Other (See Comments)    Unknown reaction   Propoxyphene Nausea And Vomiting and Other (See Comments)    Messed with his head   Shellfish Allergy Nausea And Vomiting   Sulfa Antibiotics Hives   Sulfasalazine Other (See Comments)    Unknown reaction   Vistaril [Hydroxyzine] Nausea And Vomiting and Other (See Comments)    Didn't help   Amoxicillin Rash    Per [facility]    Patient Measurements: Height: 6\' 6"  (198.1 cm) Weight: 121.7 kg (268 lb 5.5 oz) IBW/kg (Calculated) : 91.4 Heparin Dosing Weight: 116 kg  Vital Signs:    Labs: Recent Labs    12/08/21 0302 12/08/21 1404 12/09/21 0216 12/09/21 0935 12/10/21 0522 12/10/21 1603 12/10/21 1853  HGB 8.8*  --  9.4*  --  10.5*  --   --   HCT 28.5*  --  29.7*  --  34.7*  --   --   PLT 172  --  175  --  178  --   --   APTT 51*   < > 68* 66*  --   --  63*  HEPARINUNFRC  --   --  >1.10*  --   --   --  >1.10*  CREATININE 3.64*  --  3.02*  --   --  2.63*  --    < > = values in this interval not  displayed.     Estimated Creatinine Clearance: 39.4 mL/min (A) (by C-G formula based on SCr of 2.63 mg/dL (H)).  Assessment: Pharmacy consulted on admission to dose heparin for DVT worsening on apixaban therapy. On apixaban 5 mg po BID PTA - last dose 2/18 at 0730.    Transitioned back to apixaban 2/20 PM when determined that no vascular surgery planned.  Today, 2/21, transitioning apixaban to heparin/warfarin since considering DVT propagation as apixaban failure.   12/10/21 evening aPTT=63,  sub-therapeutic with heparin infusing at  2150 units/hr HL falsely elevated with recent apixaban dose CBC today: Hgb 10.5, improved from admit, Plts wnl SCr trending down to 2.63  Goal of Therapy:  Heparin level 0.3-0.7 units/ml aPTT 66-102 seconds INR 2-3 Monitor platelets by anticoagulation protocol: Yes   Plan:  Increase heparin infusion at 2300 units/hr Check 8 hr aPTT and daily HL - HL may be falsely elevated with recent apixaban dose and elevated SCr.  Once aPTT and HL correlate, can monitor heparin using HL only Daily PT/INR for warfarin dosing (dose already given today)  Monitor CBC daily and signs/symptoms of bleeding   Royetta Asal, PharmD, [place] Please utilize Amion for appropriate phone number to reach the unit pharmacist ([place]) 12/10/2021 7:26 PM

## 2021-12-10 NOTE — Assessment & Plan Note (Addendum)
cont sythroid

## 2021-12-10 NOTE — Discharge Planning (Signed)
Oncology Discharge Planning Admission Note  [facility] at [facility] Address: [address], [place], [place] [address] Hours of Operation:  8am - 5pm, Monday - Friday  Clinic Contact Information:  [phone]) [phone]  Oncology Care Team: Medical Oncologist:  New Patient  Myrtha Mantis, NP is aware of this hospital admission dated 12/07/21 and has assessed patient at the bedside. The cancer center will follow [facility] inpatient care to assist with discharge planning as indicated by the oncologist.  Arrangements are currently in process for outpatient care.  Disclaimer:  This [place] note does not imply a formal consult request has been made by the admitting attending for this admission or there will be an inpatient consult completed by oncology.  Please request oncology consults as per standard process as indicated.

## 2021-12-10 NOTE — Progress Notes (Signed)
[place] for Eliquis >> Heparin/Warfarin Indication: chronic DVT, possible Eliquis failure  Allergies  Allergen Reactions   Other Other (See Comments)    Blood pressure issues Per [facility] hospital: Pt states he can only take these pain meds or else he gets very sick: Oxycontin,morphine,demerol, and dilaudid are the only pain meds pt states he can take.     Oxymorphone Other (See Comments)    Causes kidney problems   Aspirin Nausea And Vomiting    Per [facility]   Beta Vulgaris Nausea And Vomiting    Beets   Buspirone Nausea And Vomiting and Other (See Comments)    Affected his head   Cabbage Nausea And Vomiting   Codeine Other (See Comments)    Unknown reaction   Dolobid [Diflunisal] Other (See Comments)    Unknown reaction   Fish Allergy Nausea And Vomiting   Fish-Derived Products Nausea And Vomiting   Methadone Other (See Comments)    Made him loopy   Pentazocine Other (See Comments)    Unknown reaction   Propoxyphene Nausea And Vomiting and Other (See Comments)    Messed with his head   Shellfish Allergy Nausea And Vomiting   Sulfa Antibiotics Hives   Sulfasalazine Other (See Comments)    Unknown reaction   Vistaril [Hydroxyzine] Nausea And Vomiting and Other (See Comments)    Didn't help   Amoxicillin Rash    Per [facility]    Patient Measurements: Height: 6\' 6"  (198.1 cm) Weight: 121.7 kg (268 lb 5.5 oz) IBW/kg (Calculated) : 91.4 Heparin Dosing Weight: 116 kg  Vital Signs: Temp: 98.5 F (36.9 C) (02/21 0503) Temp Source: Oral (02/21 0503) BP: 183/111 (02/21 0503) Pulse Rate: 91 (02/21 0503)  Labs: Recent Labs    12/07/21 1445 12/07/21 1445 12/07/21 1634 12/07/21 1745 12/08/21 0302 12/08/21 1404 12/09/21 0216 12/09/21 0935 12/10/21 0522  HGB 8.9*  --   --   --  8.8*  --  9.4*  --  10.5*  HCT 28.3*  --   --   --  28.5*  --  29.7*  --  34.7*  PLT 173  --   --   --  172  --  175  --  178  APTT   --    < > 42*  --  51* 52* 68* 66*  --   HEPARINUNFRC  --   --  >1.10*  --   --   --  >1.10*  --   --   CREATININE 3.68*  --   --   --  3.64*  --  3.02*  --   --   CKTOTAL 180  --   --   --   --   --   --   --   --   TROPONINIHS 8  --   --  8  --   --   --   --   --    < > = values in this interval not displayed.     Estimated Creatinine Clearance: 34.3 mL/min (A) (by C-G formula based on SCr of 3.02 mg/dL (H)).  Assessment: Pharmacy consulted on admission to dose heparin for DVT worsening on apixaban therapy. On apixaban 5 mg po BID PTA - last dose 2/18 at 0730.    Transitioned back to apixaban 2/20 PM when determined that no vascular surgery planned.  Today, 2/21, transitioning apixaban to heparin/warfarin since considering DVT propagation as apixaban failure.   12/10/21  Last aPTT at low end of therapeutic range on 2100 units/hr (2/20) CBC today: Hgb 10.5, improved from admit, Plts wnl SCr = 3.02 on 2/20  Goal of Therapy:  Heparin level 0.3-0.7 units/ml aPTT 66-102 seconds INR 2-3 Monitor platelets by anticoagulation protocol: Yes   Plan:  Resume heparin drip at 2150 units/hr (previous rate) Check aPTT and HL in 8hrs - HL may be falsely elevated with recent apixaban dose and elevated SCr.  Once aPTT and HL correlate, can monitor heparin using HL only Warfarin 7.5mg  PO x 1 at 1200 noon today Daily PT/INR Monitor CBC daily and signs/symptoms of bleeding D/C heparin & daily labs once Eliquis dose given Monitor for s/sx of bleeding   Peggyann Juba, PharmD, BCPS Pharmacy: [phone] 12/10/2021 9:52 AM

## 2021-12-10 NOTE — Assessment & Plan Note (Addendum)
Fairly stable on multiple regimen including Clonidine amlodipine, metoprolol

## 2021-12-10 NOTE — Assessment & Plan Note (Addendum)
Blood sugar well controlled. Holding metformin and home glipizide due to hypoglycemia and CKD status. Not needed insulin. His hba1c was at 6.0 on 09/20/21, fu with pcp to discuss outpatient diabetic oral regimen Recent Labs  Lab 12/14/21 0743 12/14/21 1136 12/14/21 1629 12/14/21 2018 12/15/21 0757  GLUCAP 112* 111* 128* 189* 119*

## 2021-12-10 NOTE — Progress Notes (Addendum)
Brief hematology note  Request for patient to be seen by hematology.  Advised by on-call medical oncologist that the patient needs an outpatient hematology appointment.  Order placed for hematology referral.  New patient scheduler will arrange an appointment and notify the patient of the date/time.  Mikey Bussing, DNP, AGPCNP-BC, AOCNP  ADDENDUM: I noted that the patient's address on file is in [place], [place].  If he will discharge back to his home in [place], he can follow-up with the [facility] cancer center.  I have updated my new patient scheduler so that she can offer him the choice of follow-up here in [place] versus follow-up in [place].

## 2021-12-10 NOTE — Assessment & Plan Note (Signed)
Continue Flomax Proscar

## 2021-12-10 NOTE — Progress Notes (Signed)
Patient continues to decline nocturnal CPAP. Order changed to prn.  

## 2021-12-10 NOTE — Assessment & Plan Note (Addendum)
Continue with PT OT.Refusing placement/HH.

## 2021-12-10 NOTE — Assessment & Plan Note (Signed)
Uses prosthesis at home

## 2021-12-10 NOTE — Assessment & Plan Note (Signed)
Continue current oxycodone pain management.  Minimize IV narcotics

## 2021-12-10 NOTE — Progress Notes (Signed)
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  Found to have cellulitis w/ edematous and warm appearing left lower extremity started on antibiotics, heparin drip and admission requested for further management. Seen by vascular felt this as an anticoagulation failure, recommend Unna boot and compression stockings with wound care, patient removed Unna boot after placement as unable to tolerate. Repeat ABI within normal limits. PTOT has seen and advised home health PT.  Which he has refused patient also reports placement.  Discussed with Dr. Irene Limbo from hematology about overall condition he advised to switch to Coumadin with heparin bridge given his anticoagulation failure, renal dysfunction    Subjective: Seen this am  Sleeping and got stratled when I woke him up No new complaints Rt BKA, LLE redness improved, compression dressing  =  Assessment and Plan: * Left leg cellulitis- (present on admission) On presentation had erythema tenderness swelling this has resolved looks significantly better with IV antibiotics.  Will transition to oral Keflex.  Continue Unna boot compression wound care but he is refusing.  Seen by vascular ABI normal  DVT (deep venous thrombosis) ([place])- (present on admission) DVT Lt LE w/ more proximal propagation this admission Hx of PE: Seen by vascular felt this as an anticoagulation failure, recommend Unna boot and compression stockings wound care.Discussed with Dr. Irene Limbo from hematology about overall condition he advised to switch to Coumadin with heparin bridge given his anticoagulation failure, renal dysfunction.  Pharmacy consulted to switch back to heparin and dose Coumadin with monitoring of PT/INR. No results for input(s): INR in the last 168 hours.   Obesity, Class I, BMI 30-34.9 Will benefit with PCP follow-up, weight loss  healthy lifestyle and outpatient sleep evaluation.  Hypothyroidism- (present on admission) Resume sythroid  Physical deconditioning- (present on admission) Continue with PT OT.Refusing placement.  Chronic pain syndrome- (present on admission) Continue current oxycodone pain management.  Minimize IV narcotics  CKD (chronic kidney disease), stage IV ([place])- (present on admission) AKI on CKD stage IV versus progression of CKD stage IV: baseline creatinine on last discharge 2.7 on 1/2, was 3.5 prior to that.  Currently holding at 3.0 with some improvement.  Avoid nephrotoxic medication.  Monitor Recent Labs  Lab 12/07/21 1445 12/08/21 0302 12/09/21 0216  BUN 69* 64* 49*  CREATININE 3.68* 3.64* 3.02*     BPH (benign prostatic hyperplasia)- (present on admission) Continue Flomax Proscar  GERD (gastroesophageal reflux disease)- (present on admission) Continue PPI  Chronic combined systolic and diastolic CHF (congestive heart failure) ([place])- (present on  admission) Not  in failure currently.  Resume home meds upon discharge  HTN (hypertension)- (present on admission) Blood pressure poorly controlled will resume clonidine amlodipine, continue to hold metoprolol   Gout- (present on admission) Resume home colchicine and allopurinol.  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 :  DVT prophylaxis: HEPARIN.COUMADIN Code Status:   Code Status: Full Code Family Communication: plan of care discussed with patient at bedside.  Disposition: Currently NOT medically stable for discharge. Status is: Inpatient Remains inpatient appropriate because: For management of DVT with heparin and transition to Coumadin. WILL NEED 2-3 DAYS  Objective: Vitals last 24 hrs: Vitals:   12/09/21 0400 12/09/21 1511 12/09/21 1943 12/10/21 0503  BP: (!) 169/82 (!) 171/102 (!) 173/113 (!) 183/111  Pulse: 81 90 (!) 104 91  Resp: 20 20 14 10   Temp: 98.2 F (36.8 C) 99.1 F (37.3 C) 99 F (37.2 C) 98.5 F (36.9 C)  TempSrc: Oral Oral Oral Oral  SpO2: 91% 93% 92% 94%  Weight:      Height:       Weight change:   Physical Examination: General exam: AA0X3, OBESE,older than stated age, weak appearing. HEENT:Oral mucosa moist, Ear/Nose WNL grossly, dentition normal. Respiratory system: bilaterally diminished BS, no use of accessory muscle Cardiovascular system: S1 & S2 +, No JVD,. Gastrointestinal system: Abdomen soft,NT,ND, BS+ Nervous System:Alert, awake, moving extremities and grossly nonfocal Extremities: LE edema on left lower leg chronic appearing, right BKA,distal peripheral pulses palpable.  Skin: No rashes,no icterus. MSK: Normal muscle bulk,tone, power  Medications reviewed:   Scheduled Meds:  allopurinol  100 mg Oral Daily   amLODipine  5 mg Oral Daily   bisacodyl  10 mg Rectal QHS   cloNIDine  0.1 mg Oral TID   colchicine  0.6 mg Oral Daily   [START ON 12/11/2021] ferrous sulfate  325 mg Oral Q breakfast   finasteride  5 mg Oral Daily   gabapentin  300 mg Oral TID   insulin aspart  0-6 Units Subcutaneous TID WC   [START ON 12/11/2021] levothyroxine  50 mcg Oral QAC breakfast   oxyCODONE  15 mg Oral Q12H   pantoprazole  40 mg Oral BID   polyethylene glycol  17 g Oral Daily   senna-docusate  1 tablet Oral BID   tamsulosin  0.4 mg Oral Daily   Warfarin - Pharmacist Dosing Inpatient   Does not apply q1600   Continuous Infusions:   ceFAZolin (ANCEF) IV 2 g (12/10/21 0458)   heparin 2,150 Units/hr (12/10/21 1025)   Diet Order             Diet renal/carb modified with fluid restriction Diet-HS Snack? Nothing; Fluid restriction: 1500 mL Fluid; Room service appropriate? Yes; Fluid consistency: Thin  Diet effective now                  Intake/Output Summary (Last 24 hours) at 12/10/2021 1154 Last data filed at 12/10/2021 0815 Gross per 24 hour  Intake 1032.21 ml  Output 1850 ml  Net -817.79 ml   Net IO Since Admission: -1,427.84 mL [12/10/21 1154]  Wt Readings from Last 3 Encounters:  12/08/21 121.7 kg  10/20/21 126.8 kg  10/07/21 124.1 kg  Unresulted Labs (From admission, onward)     Start     Ordered   12/11/21 0500  Protime-INR  Daily,   R      12/10/21 0951   12/10/21 1830  APTT  Once,   R        12/10/21 1029   12/10/21 1830  Heparin level (unfractionated)  Once-Timed,   TIMED        12/10/21 1029          Data Reviewed: I have personally reviewed following labs and imaging studies CBC: Recent Labs  Lab 12/07/21 1445 12/08/21 0302 12/09/21 0216 12/10/21 0522  WBC 5.7 5.3 5.4 5.7  NEUTROABS 3.5  --   --   --   HGB 8.9* 8.8* 9.4* 10.5*  HCT 28.3* 28.5* 29.7* 34.7*  MCV 102.5* 101.4* 101.0* 104.5*  PLT 173 172 175 497    Basic Metabolic Panel: Recent Labs  Lab 12/07/21 1445 12/08/21 0302 12/09/21 0216  NA 137 138 138  K 5.0 4.8 4.9  CL 104 108 107  CO2 22 22 23   GLUCOSE 138* 69* 98  BUN 69* 64* 49*  CREATININE 3.68* 3.64* 3.02*  CALCIUM 7.4* 7.3* 7.5*   GFR: Estimated Creatinine Clearance: 34.3 mL/min (A) (by C-G formula based on SCr of 3.02 mg/dL (H)). Liver Function Tests: Recent Labs  Lab 12/08/21 0302  AST 19  ALT 11  ALKPHOS 89  BILITOT 0.4  PROT 7.6  ALBUMIN 3.1*   No results for input(s): LIPASE, AMYLASE in the last 168 hours. No results for input(s): AMMONIA in the last 168 hours. Coagulation Profile: No results for input(s): INR, PROTIME in the last 168 hours. Cardiac Enzymes: Recent Labs  Lab 12/07/21 1445  CKTOTAL 180   BNP (last 3 results) No results for input(s): PROBNP in the last 8760 hours. HbA1C: No results for input(s): HGBA1C in the last 72 hours. CBG: Recent Labs  Lab 12/09/21 0732 12/09/21 1125 12/09/21 1645 12/09/21 2058 12/10/21 0804  GLUCAP 114* 130* 114* 138* 112*   Lipid Profile: No results for input(s): CHOL, HDL, LDLCALC, TRIG, CHOLHDL, LDLDIRECT in the last 72 hours. Thyroid Function Tests: No results for input(s): TSH, T4TOTAL, FREET4, T3FREE, THYROIDAB in the last 72 hours. Anemia Panel: No results for input(s): VITAMINB12, FOLATE, FERRITIN, TIBC, IRON, RETICCTPCT in the last 72 hours. Sepsis Labs: No results for input(s): PROCALCITON, LATICACIDVEN in the last 168 hours.  Recent Results (from the past 240 hour(s))  Resp Panel by RT-PCR (Flu A&B, Covid) Nasopharyngeal Swab     Status: None   Collection Time: 12/07/21  3:20 PM   Specimen: Nasopharyngeal Swab; Nasopharyngeal(NP) swabs in vial transport medium  Result Value Ref Range Status   SARS Coronavirus 2 by RT PCR NEGATIVE NEGATIVE Final    Comment: (NOTE) SARS-CoV-2 target nucleic acids are NOT DETECTED.  The SARS-CoV-2 RNA is generally detectable in upper  respiratory specimens during the acute phase of infection. The lowest concentration of SARS-CoV-2 viral copies this assay can detect is 138 copies/mL. A negative result does not preclude SARS-Cov-2 infection and should not be used as the sole basis for treatment or other patient management decisions. A negative result may occur with  improper specimen collection/handling, submission of specimen other than nasopharyngeal swab, presence of viral mutation(s) within the areas targeted by this assay, and inadequate number of viral copies(<138 copies/mL). A negative result must be combined with clinical observations, patient history, and epidemiological information. The expected result is Negative.  Fact Sheet for  Patients:  EntrepreneurPulse.com.au  Fact Sheet for Healthcare Providers:  IncredibleEmployment.be  This test is no t yet approved or cleared by the [place] FDA and  has been authorized for detection and/or diagnosis of SARS-CoV-2 by FDA under an Emergency Use Authorization (EUA). This EUA will remain  in effect (meaning this test can be used) for the duration of the COVID-19 declaration under Section 564(b)(1) of the Act, 21 U.S.C.section 360bbb-3(b)(1), unless the authorization is terminated  or revoked sooner.       Influenza A by PCR NEGATIVE NEGATIVE Final   Influenza B by PCR NEGATIVE NEGATIVE Final    Comment: (NOTE) The Xpert Xpress SARS-CoV-2/FLU/RSV plus assay is intended as an aid in the diagnosis of influenza from Nasopharyngeal swab specimens and should not be used as a sole basis for treatment. Nasal washings and aspirates are unacceptable for Xpert Xpress SARS-CoV-2/FLU/RSV testing.  Fact Sheet for Patients: EntrepreneurPulse.com.au  Fact Sheet for Healthcare Providers: IncredibleEmployment.be  This test is not yet approved or cleared by the [place] FDA and has been  authorized for detection and/or diagnosis of SARS-CoV-2 by FDA under an Emergency Use Authorization (EUA). This EUA will remain in effect (meaning this test can be used) for the duration of the COVID-19 declaration under Section 564(b)(1) of the Act, 21 U.S.C. section 360bbb-3(b)(1), unless the authorization is terminated or revoked.  Performed at [facility], [place] [address] [place], [place] [address]     Antimicrobials: Anti-infectives (From admission, onward)    Start     Dose/Rate Route Frequency Ordered Stop   12/09/21 1400  ceFAZolin (ANCEF) IVPB 2g/100 mL premix        2 g 200 mL/hr over 30 Minutes Intravenous Every 8 hours 12/09/21 0706 12/14/21 1359   12/07/21 1800  ceFAZolin (ANCEF) IVPB 2g/100 mL premix  Status:  Discontinued        2 g 200 mL/hr over 30 Minutes Intravenous Every 12 hours 12/07/21 1654 12/09/21 0706   12/07/21 1630  ceFAZolin (ANCEF) IVPB 2g/100 mL premix  Status:  Discontinued        2 g 200 mL/hr over 30 Minutes Intravenous  Once 12/07/21 1627 12/07/21 1708      Culture/Microbiology    Component Value Date/Time   SDES  10/18/2021 0037    RIGHT ANTECUBITAL Performed at [facility], [place] [address], [place], [place] [address]    SDES  10/18/2021 0037    LEFT ANTECUBITAL Performed at [facility], [place] [address], [place], [place] [address]    Olivia Lopez de Gutierrez  10/18/2021 0037    BOTTLES DRAWN AEROBIC AND ANAEROBIC Blood Culture results may not be optimal due to an excessive volume of blood received in culture bottles Performed at [facility], [place] [address], [place], [place] [address]    [place]  10/18/2021 0037    BOTTLES DRAWN AEROBIC AND ANAEROBIC Blood Culture adequate volume Performed at [place] [address] [place], [place] [address]    CULT  10/18/2021 0037    NO GROWTH 5 DAYS Performed at [place] [address]  [place], [place] [address]    CULT  10/18/2021 0037    NO GROWTH 5 DAYS Performed at [facility], [place] [address], [place], [place] [address]    REPTSTATUS 10/23/2021 FINAL 10/18/2021 0037   REPTSTATUS 10/23/2021 FINAL 10/18/2021 0037  Radiology Studies: VAS [place] ABI WITH/WO TBI  Result Date: 12/09/2021  LOWER EXTREMITY  DOPPLER STUDY Patient Name:  Thomas [name]  Date of Exam:   12/09/2021 Medical Rec #: [id number]        Accession #:    [id number] Date of Birth: [birth year]        Patient Gender: M Patient Age:   65 years Exam Location:  [facility] Procedure:      VAS [place] ABI WITH/WO TBI Referring Phys: Soundra Pilon AMIN --------------------------------------------------------------------------------  Indications: Ulceration, and peripheral artery disease. High Risk Factors: Hypertension, Diabetes. Other Factors: 12/07/2021 - LEFT:                - Findings consistent with age indeterminate deep vein                thrombosis                involving the left proximal and mid femoral vein.                - Findings consistent with chronic deep vein thrombosis                involving the                distal left femoral vein.                - No cystic structure found in the popliteal fossa.  Limitations: Today's exam was limited due to an open wound and bandages. Comparison Study: 11/30/2019 - Right:                   RT BKA.                    Left: Resting left ankle-brachial index indicates                   noncompressible left                   lower extremity arteries. Performing Technologist: Carlos Levering RVT  Examination Guidelines: A complete evaluation includes at minimum, Doppler waveform signals and systolic blood pressure reading at the level of bilateral brachial, anterior tibial, and posterior tibial arteries, when vessel segments are accessible. Bilateral testing is considered an integral part of a complete examination. Photoelectric Plethysmograph (PPG) waveforms and toe systolic  pressure readings are included as required and additional duplex testing as needed. Limited examinations for reoccurring indications may be performed as noted.  ABI Findings: +---------+------------------+-----+---------+--------+  Right     Rt Pressure (mmHg) Index Waveform  Comment   +---------+------------------+-----+---------+--------+  Brachial  181                      triphasic           +---------+------------------+-----+---------+--------+  PTA                                          BKA       +---------+------------------+-----+---------+--------+  DP                                           BKA       +---------+------------------+-----+---------+--------+  Great Toe  BKA       +---------+------------------+-----+---------+--------+ +--------+------------------+-----+----------+-------+  Left     Lt Pressure (mmHg) Index Waveform   Comment  +--------+------------------+-----+----------+-------+  Brachial 171                      triphasic           +--------+------------------+-----+----------+-------+  PTA      222                1.23  triphasic           +--------+------------------+-----+----------+-------+  DP       197                1.09  monophasic          +--------+------------------+-----+----------+-------+ +-------+-----------+-----------+------------+------------+  ABI/TBI Today's ABI Today's TBI Previous ABI Previous TBI  +-------+-----------+-----------+------------+------------+  Right   BKA                     BKA                        +-------+-----------+-----------+------------+------------+  Left    1.23                    [place]                         +-------+-----------+-----------+------------+------------+  Summary: Left: Resting left ankle-brachial index is within normal range. No evidence of significant left lower extremity arterial disease. Unable to obtain TBI due to great toe size.  *See table(s) above for measurements and observations.   Electronically signed by Monica Martinez MD on 12/09/2021 at 2:33:42 PM.    Final      LOS: 3 days   Antonieta Pert, MD [facility]  12/10/2021, 11:54 AM

## 2021-12-10 NOTE — Assessment & Plan Note (Addendum)
Not in failure currently. Resume home diuretics Net IO Since Admission: -4,953.75 mL [12/15/21 0831]  Filed Weights   12/07/21 1446 12/07/21 2120 12/08/21 0438  Weight: 126.8 kg 121.7 kg 121.7 kg

## 2021-12-10 NOTE — Assessment & Plan Note (Signed)
Will benefit with PCP follow-up, weight loss  healthy lifestyle and outpatient sleep evaluation.

## 2021-12-10 NOTE — Evaluation (Signed)
Occupational Therapy Evaluation Patient Details Name: Thomas [name] MRN: [id number] DOB: 04/03/1951 Today's Date: 12/10/2021   History of Present Illness 71 yo male admitted with L LE cellulitis, worsening L LE DVT. Hx of chronic pain, back sg, DVT, PE, DM, CKD, R BKA, HF, gout   Clinical Impression   Patient is a 71 year old male who lives at home alone prior level. Currently, patients pain is impacting patients participation in time out of bed. Patient would benefit from 24/7 caregiver support in next level of care. Patient would continue to benefit from skilled OT services at this time while admitted and after d/c to address noted deficits in order to improve overall safety and independence in ADLs.        Recommendations for follow up therapy are one component of a multi-disciplinary discharge planning process, led by the attending physician.  Recommendations may be updated based on patient status, additional functional criteria and insurance authorization.   Follow Up Recommendations  Skilled nursing-short term rehab (<3 hours/day)    Assistance Recommended at Discharge Frequent or constant Supervision/Assistance  Patient can return home with the following A little help with walking and/or transfers;A little help with bathing/dressing/bathroom;Assistance with cooking/housework;Direct supervision/assist for financial management;Assist for transportation;Direct supervision/assist for medications management;Help with stairs or ramp for entrance    Functional Status Assessment  Patient has had a recent decline in their functional status and demonstrates the ability to make significant improvements in function in a reasonable and predictable amount of time.  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 Restrictions Weight Bearing  Restrictions: No      Mobility Bed Mobility Overal bed mobility: Modified Independent                  Transfers                          Balance Overall balance assessment: Needs assistance   Sitting balance-[name]: Good Sitting balance - Comments: No LOB with UE support                                   ADL either performed or assessed with clinical judgement   ADL Overall ADL's : Needs assistance/impaired Eating/Feeding: Set up;Sitting   Grooming: Wash/dry face;Wash/dry hands;Sitting;Set up;Applying deodorant Grooming Details (indicate cue type and reason): EOB Upper Body Bathing: Set up;Sitting Upper Body Bathing Details (indicate cue type and reason): EOB Lower Body Bathing: Set up;Sitting/lateral leans Lower Body Bathing Details (indicate cue type and reason): EOB Upper Body Dressing : Set up;Sitting   Lower Body Dressing: Minimal assistance;Sit to/from stand;Sitting/lateral leans   Toilet Transfer: +2 for physical assistance   Toileting- Clothing Manipulation and Hygiene: Bed level;Minimal assistance               Vision Baseline Vision/History: 1 Wears glasses Patient Visual Report: No change from baseline       Perception     Praxis      Pertinent Vitals/Pain Pain Assessment Pain Score: 8  Pain Location: back and L calf (patient noted to pick LE off the bed and slam it back down when "burning' started in leg.) Pain Descriptors / Indicators: Discomfort, Sore, Burning Pain Intervention(s): Repositioned, Limited activity within patient's tolerance, Patient  requesting pain meds-RN notified, RN gave pain meds during session     Hand Dominance Right   Extremity/Trunk Assessment Upper Extremity Assessment Upper Extremity Assessment: Overall WFL for tasks assessed   Lower Extremity Assessment Lower Extremity Assessment: Defer to PT evaluation   Cervical / Trunk Assessment Cervical / Trunk Assessment: Normal    Communication Communication Communication: No difficulties   Cognition Arousal/Alertness: Awake/alert Behavior During Therapy: WFL for tasks assessed/performed Overall Cognitive Status: Within Functional Limits for tasks assessed                                       General Comments       Exercises     Shoulder Instructions      Home Living Family/patient expects to be discharged to:: Private residence Living Arrangements: Alone Available Help at Discharge: Available PRN/intermittently Type of Home: House Home Access: Stairs to enter [employer] of Steps: 1 Entrance Stairs-Rails: None Home Layout: One level     Bathroom Shower/Tub: Teacher, early years/pre: Standard Bathroom Accessibility: Yes   Home Equipment: Conservation officer, nature (2 wheels);Cane - single point;Crutches          Prior Functioning/Environment Prior Level of Function : Needs assist               ADLs Comments: patient reported being independent in ADLs. patient reported preferring sink bathing to showers. patient reported using RW at home for functional mobility.        OT Problem List: Decreased activity tolerance;Impaired balance (sitting and/or standing);Decreased safety awareness;Decreased knowledge of precautions;Decreased knowledge of use of DME or AE      OT Treatment/Interventions: Self-care/ADL training;Therapeutic exercise;Neuromuscular education;Energy conservation;DME and/or AE instruction;Therapeutic activities;Balance training;Patient/family education    OT Goals(Current goals can be found in the care plan section) Acute Rehab OT Goals Patient Stated Goal: to go home OT Goal Formulation: With patient Time For Goal Achievement: 12/24/21 Potential to Achieve Goals: Good  OT Frequency: Min 2X/week    Co-evaluation              AM-PAC OT "6 Clicks" Daily Activity     Outcome Measure Help from another person eating meals?: None Help from  another person taking care of personal grooming?: None Help from another person toileting, which includes using toliet, bedpan, or urinal?: A Little Help from another person bathing (including washing, rinsing, drying)?: A Little Help from another person to put on and taking off regular upper body clothing?: None Help from another person to put on and taking off regular lower body clothing?: A Little 6 Click Score: 21   End of Session Nurse Communication: Mobility status  Activity Tolerance: Patient tolerated treatment well Patient left: in bed;with call bell/phone within reach;with bed alarm set  OT Visit Diagnosis: Unsteadiness on feet (R26.81)                Time: 2202-5427 OT Time Calculation (min): 25 min Charges:  OT General Charges $OT Visit: 1 Visit OT Evaluation $OT Eval Moderate Complexity: 1 Mod OT Treatments $Self Care/Home Management : 8-22 mins  Jackelyn Poling OTR/L, MS Acute Rehabilitation Department Office# [phone] Pager# [phone]   Marcellina Millin 12/10/2021, 1:46 PM

## 2021-12-10 NOTE — Assessment & Plan Note (Addendum)
DVTLt LE w/ more proximal propagation this admission Hx of PE: Seen by vascular surgery and also discussed with hematology oncology (Dr Edd Fabian to be Eliquis failure-and transitioned to Coumadin w/ heparin bridge.  Pharmacy dosing Coumadin and heparin-INR now therapeutic. 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  We will continue on his chronic narcotics.  He is now stable for discharge on Coumadin, follow-up arranged with hematology Dr Irene Limbo upon discharge. Will need home health/Coumadin clinic or PCP follow-up to adjust to monitor Coumadin Recent Labs  Lab 12/11/21 0522 12/12/21 0750 12/13/21 0558 12/14/21 0828 12/15/21 0559  INR 1.6* 1.7* 1.7* 1.9* 2.1*

## 2021-12-10 NOTE — Assessment & Plan Note (Addendum)
Improved -completed Keflexx7 days  And continue wound car. Seen by vascular ABI normal advised Unna boot/compression dressing

## 2021-12-10 NOTE — Care Management Important Message (Signed)
Important Message  Patient Details IM Letter placed in Patients room. Name: Thomas [name] MRN: [id number] Date of Birth: January 14, 1951   Medicare Important Message Given:  Yes     Kerin Salen 12/10/2021, 11:14 AM

## 2021-12-10 NOTE — Assessment & Plan Note (Signed)
Continue PPI ?

## 2021-12-11 DIAGNOSIS — I471 Supraventricular tachycardia: Secondary | ICD-10-CM

## 2021-12-11 DIAGNOSIS — L03116 Cellulitis of left lower limb: Secondary | ICD-10-CM | POA: Diagnosis not present

## 2021-12-11 LAB — PROTIME-INR
INR: 1.6 — ABNORMAL HIGH (ref 0.8–1.2)
Prothrombin Time: 19.5 seconds — ABNORMAL HIGH (ref 11.4–15.2)

## 2021-12-11 LAB — GLUCOSE, CAPILLARY
Glucose-Capillary: 107 mg/dL — ABNORMAL HIGH (ref 70–99)
Glucose-Capillary: 99 mg/dL (ref 70–99)
Glucose-Capillary: 148 mg/dL — ABNORMAL HIGH (ref 70–99)
Glucose-Capillary: 139 mg/dL — ABNORMAL HIGH (ref 70–99)

## 2021-12-11 LAB — APTT
aPTT: 78 seconds — ABNORMAL HIGH (ref 24–36)
aPTT: 91 seconds — ABNORMAL HIGH (ref 24–36)

## 2021-12-11 LAB — HEPARIN LEVEL (UNFRACTIONATED): Heparin Unfractionated: >1.1 IU/mL — ABNORMAL HIGH (ref 0.30–0.70)

## 2021-12-11 MED ORDER — WARFARIN SODIUM 5 MG PO TABS
5.0000 mg | ORAL_TABLET | Freq: Once | ORAL | Status: AC
  Administered 2021-12-11: 5 mg via ORAL
  Filled 2021-12-11: qty 1

## 2021-12-11 NOTE — Progress Notes (Signed)
[place] for Eliquis >> Heparin/Warfarin Indication: chronic DVT, possible Eliquis failure  Allergies  Allergen Reactions   Other Other (See Comments)    Blood pressure issues Per [facility] hospital: Pt states he can only take these pain meds or else he gets very sick: Oxycontin,morphine,demerol, and dilaudid are the only pain meds pt states he can take.     Oxymorphone Other (See Comments)    Causes kidney problems   Aspirin Nausea And Vomiting    Per [facility]   Beta Vulgaris Nausea And Vomiting    Beets   Buspirone Nausea And Vomiting and Other (See Comments)    Affected his head   Cabbage Nausea And Vomiting   Codeine Other (See Comments)    Unknown reaction   Dolobid [Diflunisal] Other (See Comments)    Unknown reaction   Fish Allergy Nausea And Vomiting   Fish-Derived Products Nausea And Vomiting   Methadone Other (See Comments)    Made him loopy   Pentazocine Other (See Comments)    Unknown reaction   Propoxyphene Nausea And Vomiting and Other (See Comments)    Messed with his head   Shellfish Allergy Nausea And Vomiting   Sulfa Antibiotics Hives   Sulfasalazine Other (See Comments)    Unknown reaction   Vistaril [Hydroxyzine] Nausea And Vomiting and Other (See Comments)    Didn't help   Amoxicillin Rash    Per [facility]    Patient Measurements: Height: 6\' 6"  (198.1 cm) Weight: 121.7 kg (268 lb 5.5 oz) IBW/kg (Calculated) : 91.4 Heparin Dosing Weight: 116 kg  Vital Signs: Temp: 98.8 F (37.1 C) (02/22 0515) Temp Source: Oral (02/22 0515) BP: 149/99 (02/22 0515) Pulse Rate: 75 (02/22 0515)  Labs: Recent Labs    12/09/21 0216 12/09/21 0935 12/10/21 0522 12/10/21 1603 12/10/21 1853 12/11/21 0522 12/11/21 0914  HGB 9.4*  --  10.5*  --   --   --   --   HCT 29.7*  --  34.7*  --   --   --   --   PLT 175  --  178  --   --   --   --   APTT 68*   < >  --   --  63* 78* 91*  LABPROT  --   --   --   --    --  19.5*  --   INR  --   --   --   --   --  1.6*  --   HEPARINUNFRC >1.10*  --   --   --  >1.10* >1.10*  --   CREATININE 3.02*  --   --  2.63*  --   --   --    < > = values in this interval not displayed.     Estimated Creatinine Clearance: 39.4 mL/min (A) (by C-G formula based on SCr of 2.63 mg/dL (H)).  Assessment: Pharmacy consulted on admission to dose heparin for DVT worsening on apixaban therapy. On apixaban 5 mg po BID PTA - last dose 2/18 at 0730.    Transitioned back to apixaban 2/20 PM when determined that no vascular surgery planned.  Today, 2/21, transitioning apixaban to heparin/warfarin since considering DVT propagation as apixaban failure.   12/11/21  aPTT=91, therapeutic on 2300 units/hr CBC yesterday: Hgb 10.5, improved from admit, Plts wnl INR 1.6 after one dose warfarin 7.5mg  yesterday SCr trending down to 2.63 No major drug-drug interactions note  Goal of  Therapy:  Heparin level 0.3-0.7 units/ml aPTT 66-102 seconds INR 2-3 Monitor platelets by anticoagulation protocol: Yes   Plan:  Continue heparin infusion at 2300 units/hr Warfarin 5mg  PO x 1 at 16:00 Check aPTT and daily HL - HL may be falsely elevated with recent apixaban dose and elevated SCr.  Once aPTT and HL correlate, can monitor heparin using HL only Daily PT/INR for warfarin dosing  Monitor CBC daily and signs/symptoms of bleeding   Peggyann Juba, PharmD, BCPS Pharmacy: [phone] 12/11/2021, 10:51 AM

## 2021-12-11 NOTE — Progress Notes (Signed)
Physical Therapy Treatment Patient Details Name: Thomas [name] MRN: [id number] DOB: 08-Jul-1951 Today's Date: 12/11/2021   History of Present Illness 71 yo male admitted with L LE cellulitis, worsening L LE DVT. Hx of chronic pain, back sg, DVT, PE, DM, CKD, R BKA, HF, gout    PT Comments    Pt not agreeable to stand or ambulate today however was agreeable to sit EOB and with encouragement performed lateral scoot to drop arm recliner.  Pt familiar to this PT from previous admission and typically returns home upon d/c.  Pt reports he is able to mobilize around home with RW and does not have w/c as it will not fit through home.  Pt also tends to pick on his scabs and advised multiple times during session to not touch scabs and creating open wounds can lead to further infection.  Pt does not appear to have good insight into skin condition and care.    Recommendations for follow up therapy are one component of a multi-disciplinary discharge planning process, led by the attending physician.  Recommendations may be updated based on patient status, additional functional criteria and insurance authorization.  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 Restrictions Weight Bearing Restrictions: No     Mobility  Bed Mobility Overal bed mobility: Modified Independent                  Transfers Overall transfer level: Needs assistance Equipment used: None Transfers: Bed to chair/wheelchair/BSC            Lateral/Scoot Transfers: Min guard, From  elevated surface General transfer comment: pt declined wearing prosthesis and standing so encouraged OOB via lateral scoot to recliner and pt was agreeable to this, provided set up and min/guard for safety however pt required no physical assist    Ambulation/Gait                   Stairs             Wheelchair Mobility    Modified Rankin (Stroke Patients Only)       Balance                                            Cognition Arousal/Alertness: Awake/alert Behavior During Therapy: WFL for tasks assessed/performed Overall Cognitive Status: Within Functional Limits for tasks assessed                                          Exercises      General Comments        Pertinent Vitals/Pain Pain Assessment Pain Assessment: Faces Faces Pain Scale: Hurts little more Pain Location: back and L calf Pain Descriptors / Indicators: Discomfort, Sore, Burning Pain Intervention(s): Monitored during session, Repositioned    Home Living  Prior Function            PT Goals (current goals can now be found in the care plan section) Progress towards PT goals: Progressing toward goals    Frequency    Min 3X/week      PT Plan Current plan remains appropriate    Co-evaluation              AM-PAC PT "6 Clicks" Mobility   Outcome Measure  Help needed turning from your back to your side while in a flat bed without using bedrails?: None Help needed moving from lying on your back to sitting on the side of a flat bed without using bedrails?: None Help needed moving to and from a bed to a chair (including a wheelchair)?: A Little Help needed standing up from a chair using your arms (e.g., wheelchair or bedside chair)?: A Little Help needed to walk in hospital room?: A Little Help needed climbing 3-5 steps with a railing? : A Lot 6 Click Score: 19    End of Session   Activity Tolerance:  Patient tolerated treatment well Patient left: in chair;with call bell/phone within reach Nurse Communication: Mobility status PT Visit Diagnosis: Muscle weakness (generalized) (M62.81);Difficulty in walking, not elsewhere classified (R26.2)     Time: 8325-4982 PT Time Calculation (min) (ACUTE ONLY): 19 min  Charges:  $Therapeutic Activity: 8-22 mins          Jannette Spanner PT, DPT Acute Rehabilitation Services Pager: [phone] Office: [place] 12/11/2021, 3:49 PM

## 2021-12-11 NOTE — Discharge Instructions (Signed)
Information on my medicine - Coumadin   (Warfarin)  This medication education was reviewed with me or my healthcare representative as part of my discharge preparation.  The pharmacist that spoke with me during my hospital stay was:  Shalen Petrak, Student-PharmD  Why was Coumadin prescribed for you? Coumadin was prescribed for you because you have a blood clot or a medical condition that can cause an increased risk of forming blood clots. Blood clots can cause serious health problems by blocking the flow of blood to the heart, lung, or brain. Coumadin can prevent harmful blood clots from forming. As a reminder your indication for Coumadin is:  Deep Venous Thrombosis treatment  What test will check on my response to Coumadin? While on Coumadin (warfarin) you will need to have an INR test regularly to ensure that your dose is keeping you in the desired range. The INR (international normalized ratio) number is calculated from the result of the laboratory test called prothrombin time (PT).  If an INR APPOINTMENT HAS NOT ALREADY BEEN MADE FOR YOU please schedule an appointment to have this lab work done by your health care provider within 7 days. Your INR goal is usually a number between:  2 to 3 or your provider may give you a more narrow range like 2-2.5.  Ask your health care provider during an office visit what your goal INR is.  What  do you need to  know  About  COUMADIN? Take Coumadin (warfarin) exactly as prescribed by your healthcare provider about the same time each day.  DO NOT stop taking without talking to the doctor who prescribed the medication.  Stopping without other blood clot prevention medication to take the place of Coumadin may increase your risk of developing a new clot or stroke.  Get refills before you run out.  What do you do if you miss a dose? If you miss a dose, take it as soon as you remember on the same day then continue your regularly scheduled regimen the next day.  Do  not take two doses of Coumadin at the same time.  Important Safety Information A possible side effect of Coumadin (Warfarin) is an increased risk of bleeding. You should call your healthcare provider right away if you experience any of the following: Bleeding from an injury or your nose that does not stop. Unusual colored urine (red or dark brown) or unusual colored stools (red or black). Unusual bruising for unknown reasons. A serious fall or if you hit your head (even if there is no bleeding).  Some foods or medicines interact with Coumadin (warfarin) and might alter your response to warfarin. To help avoid this: Eat a balanced diet, maintaining a consistent amount of Vitamin K. Notify your provider about major diet changes you plan to make. Avoid alcohol or limit your intake to 1 drink for women and 2 drinks for men per day. (1 drink is 5 oz. wine, 12 oz. beer, or 1.5 oz. liquor.)  Make sure that ANY health care provider who prescribes medication for you knows that you are taking Coumadin (warfarin).  Also make sure the healthcare provider who is monitoring your Coumadin knows when you have started a new medication including herbals and non-prescription products.  Coumadin (Warfarin)  Major Drug Interactions  Increased Warfarin Effect Decreased Warfarin Effect  Alcohol (large quantities) Antibiotics (esp. Septra/Bactrim, Flagyl, Cipro) Amiodarone (Cordarone) Aspirin (ASA) Cimetidine (Tagamet) Megestrol (Megace) NSAIDs (ibuprofen, naproxen, etc.) Piroxicam (Feldene) Propafenone (Rythmol SR) Propranolol (Inderal) Isoniazid (INH)  Posaconazole (Noxafil) Barbiturates (Phenobarbital) Carbamazepine (Tegretol) Chlordiazepoxide (Librium) Cholestyramine (Questran) Griseofulvin Oral Contraceptives Rifampin Sucralfate (Carafate) Vitamin K   Coumadin (Warfarin) Major Herbal Interactions  Increased Warfarin Effect Decreased Warfarin Effect  Garlic Ginseng Ginkgo biloba Coenzyme  Q10 Green tea St. Johns wort    Coumadin (Warfarin) FOOD Interactions  Eat a consistent number of servings per week of foods HIGH in Vitamin K (1 serving =  cup)  Collards (cooked, or boiled & drained) Kale (cooked, or boiled & drained) Mustard greens (cooked, or boiled & drained) Parsley *serving size only =  cup Spinach (cooked, or boiled & drained) Swiss chard (cooked, or boiled & drained) Turnip greens (cooked, or boiled & drained)  Eat a consistent number of servings per week of foods MEDIUM-HIGH in Vitamin K (1 serving = 1 cup)  Asparagus (cooked, or boiled & drained) Broccoli (cooked, boiled & drained, or raw & chopped) Brussel sprouts (cooked, or boiled & drained) *serving size only =  cup Lettuce, raw (green leaf, endive, romaine) Spinach, raw Turnip greens, raw & chopped   These websites have more information on Coumadin (warfarin):  FailFactory.se; VeganReport.com.au;

## 2021-12-11 NOTE — Progress Notes (Signed)
[place] for Eliquis >> Heparin/Warfarin Indication: chronic DVT, possible Eliquis failure  Allergies  Allergen Reactions   Other Other (See Comments)    Blood pressure issues Per [facility] hospital: Pt states he can only take these pain meds or else he gets very sick: Oxycontin,morphine,demerol, and dilaudid are the only pain meds pt states he can take.     Oxymorphone Other (See Comments)    Causes kidney problems   Aspirin Nausea And Vomiting    Per [facility]   Beta Vulgaris Nausea And Vomiting    Beets   Buspirone Nausea And Vomiting and Other (See Comments)    Affected his head   Cabbage Nausea And Vomiting   Codeine Other (See Comments)    Unknown reaction   Dolobid [Diflunisal] Other (See Comments)    Unknown reaction   Fish Allergy Nausea And Vomiting   Fish-Derived Products Nausea And Vomiting   Methadone Other (See Comments)    Made him loopy   Pentazocine Other (See Comments)    Unknown reaction   Propoxyphene Nausea And Vomiting and Other (See Comments)    Messed with his head   Shellfish Allergy Nausea And Vomiting   Sulfa Antibiotics Hives   Sulfasalazine Other (See Comments)    Unknown reaction   Vistaril [Hydroxyzine] Nausea And Vomiting and Other (See Comments)    Didn't help   Amoxicillin Rash    Per [facility]    Patient Measurements: Height: 6\' 6"  (198.1 cm) Weight: 121.7 kg (268 lb 5.5 oz) IBW/kg (Calculated) : 91.4 Heparin Dosing Weight: 116 kg  Vital Signs: Temp: 98.8 F (37.1 C) (02/22 0515) Temp Source: Oral (02/22 0515) BP: 149/99 (02/22 0515) Pulse Rate: 75 (02/22 0515)  Labs: Recent Labs    12/09/21 0216 12/09/21 0935 12/10/21 0522 12/10/21 1603 12/10/21 1853 12/11/21 0522  HGB 9.4*  --  10.5*  --   --   --   HCT 29.7*  --  34.7*  --   --   --   PLT 175  --  178  --   --   --   APTT 68* 66*  --   --  63* 78*  LABPROT  --   --   --   --   --  19.5*  INR  --   --   --    --   --  1.6*  HEPARINUNFRC >1.10*  --   --   --  >1.10* >1.10*  CREATININE 3.02*  --   --  2.63*  --   --      Estimated Creatinine Clearance: 39.4 mL/min (A) (by C-G formula based on SCr of 2.63 mg/dL (H)).  Assessment: Pharmacy consulted on admission to dose heparin for DVT worsening on apixaban therapy. On apixaban 5 mg po BID PTA - last dose 2/18 at 0730.    Transitioned back to apixaban 2/20 PM when determined that no vascular surgery planned.  Today, 2/21, transitioning apixaban to heparin/warfarin since considering DVT propagation as apixaban failure.   12/11/21  aPTT=78, therapeutic on 2300 units/hr HL falsely elevated with recent apixaban dose CBC today: Hgb 10.5, improved from admit, Plts wnl SCr trending down to 2.63  Goal of Therapy:  Heparin level 0.3-0.7 units/ml aPTT 66-102 seconds INR 2-3 Monitor platelets by anticoagulation protocol: Yes   Plan:  continue heparin infusion at 2300 units/hr Check 6 hr aPTT and daily HL - HL may be falsely elevated with recent apixaban dose  and elevated SCr.  Once aPTT and HL correlate, can monitor heparin using HL only Daily PT/INR for warfarin dosing (dose already given today) Monitor CBC daily and signs/symptoms of bleeding   Dolly Rias RPh 12/11/2021, 6:03 AM

## 2021-12-11 NOTE — Progress Notes (Signed)
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  Found to have cellulitis w/ edematous and warm appearing left lower extremity started on antibiotics, heparin drip and admission requested for further management. Seen by vascular felt this as an anticoagulation failure, recommend Unna boot and compression stockings with wound care, patient removed Unna boot after placement as unable to tolerate. Repeat ABI within normal limits. PTOT has seen and advised home health PT.  Which he has refused patient also reports placement.  Discussed with Dr. Irene Limbo from hematology about overall condition he advised to switch to Coumadin with heparin bridge given his anticoagulation failure, renal dysfunction    Subjective: Seen, resting comfortably in the bedside chair.  Requesting regular diet.  Some pain on the left lower extremities Overnight heart  rate and blood pressure stable, episode of SVT yesterday afternoon resolved w/ IV Lopressor.  Slowly trending up INR  Assessment and Plan: * Left leg cellulitis- (present on admission) Improved compared to admission,continue Keflex to complete the course.Continue Unna boot ( if he allows) cont  compression wound care but he is refusing.  Seen by vascular ABI normal  DVT (deep venous thrombosis) ([place])- (present on admission) DVT Lt LE w/ more proximal propagation this admission Hx of PE: Seen by vascular felt this as an anticoagulation failure, recommend Unna boot and compression stockings wound care.Discussed with Dr. Irene Limbo from hematology about overall condition he advised to switch to Coumadin with heparin bridge given his anticoagulation failure, renal dysfunction.Pharmacy dosing heparin and Coumadin, monitor INR daily.  Patient has been on Coumadin before and is agreeable.  He will need to have a PCP follow-up/home health with INR monitoring Recent Labs  Lab 12/11/21 0522  INR 1.6*     SVT (supraventricular tachycardia) (HCC) Episode of SVT in the 160s to 170s on 221 and resolved w/ IV Lopressor.  Likely from noncontinuous metoprolol, patient is back on home metoprolol.  Obesity, Class I, BMI 30-34.9 Will benefit with PCP follow-up, weight loss  healthy lifestyle and outpatient sleep evaluation.  Hypothyroidism- (present on admission) cont sythroid  Physical deconditioning- (present on admission) Continue with PT OT.Refusing placement/HH.  Chronic pain syndrome- (present on admission) Continue current oxycodone pain management.  Minimize IV narcotics  CKD (chronic kidney disease), stage IV ([place])- (present on admission) AKI on CKD stage IV versus progression of CKD stage IV: baseline creatinine on last discharge 2.7 on 1/2, was 3.5 prior to that. Currently at 2.6, stable. Avoid nephrotoxic medication.  Monitor Recent Labs  Lab 12/07/21 1445 12/08/21 0302 12/09/21 0216  12/10/21 1603  BUN 69* 64* 49* 35*  CREATININE 3.68* 3.64* 3.02* 2.63*     BPH (benign prostatic hyperplasia)- (present on admission) Continue Flomax Proscar  GERD (gastroesophageal reflux disease)- (present on admission) Continue PPI  Chronic combined systolic and diastolic CHF (congestive heart failure) ([place])- (present on admission) Not in failure currently.Resume home meds upon discharge  HTN (hypertension)- (present on admission) Blood pressure improved after resuming home clonidine amlodipine, metoprolol   Gout- (present on admission) cont home colchicine and allopurinol.  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 :  DVT prophylaxis: HEPARIN.COUMADIN Code Status:   Code Status: Full Code Family Communication: plan of care discussed with patient at bedside.  Disposition: Currently NOT medically stable for discharge. Status is: Inpatient Remains inpatient appropriate because: For management of DVT with heparin and transition to Coumadin. WILL NEED 2-3 DAYS  Objective: Vitals last 24 hrs: Vitals:   12/10/21 0503 12/10/21 1941 12/10/21 2000 12/11/21 0515  BP: (!) 183/111 (!) 155/118  (!) 149/99  Pulse: 91 (!) 105  75  Resp: 10 18 15 10   Temp: 98.5 F (36.9 C) 98.3 F (36.8 C)  98.8 F (37.1 C)  TempSrc: Oral Oral  Oral  SpO2: 94% 93%  94%  Weight:      Height:       Weight change:   Physical Examination: General exam: AA,0x3, weeks, pleasant older than stated age, weak appearing. HEENT:Oral mucosa moist, Ear/Nose WNL grossly, dentition normal. Respiratory system: bilaterally diminished, no use of accessory muscle Cardiovascular system: S1 & S2 +, No JVD,. Gastrointestinal system:  Abdomen soft,NT,ND,BS+ Nervous System:Alert, awake, moving extremities and grossly nonfocal Extremities: Left lower extremity with improving redness swelling, some skin excoriation present dressing in place, right BKA Skin: No rashes,no icterus. MSK: Normal muscle bulk,tone, power   Medications reviewed:  Scheduled Meds:  allopurinol  100 mg Oral Daily   amLODipine  5 mg Oral Daily   bisacodyl  10 mg Rectal QHS   cephALEXin  500 mg Oral Q6H   cloNIDine  0.1 mg Oral TID   colchicine  0.6 mg Oral Daily   ferrous sulfate  325 mg Oral Q breakfast   finasteride  5 mg Oral Daily   gabapentin  300 mg Oral TID   insulin aspart  0-6 Units Subcutaneous TID WC   levothyroxine  50 mcg Oral QAC breakfast   metoprolol tartrate  50 mg Oral Daily   oxyCODONE  15 mg Oral Q12H   polyethylene glycol  17 g Oral Daily   senna-docusate  1 tablet Oral BID   tamsulosin  0.4 mg Oral Daily   warfarin  5 mg Oral ONCE-1600   Warfarin - Pharmacist Dosing Inpatient   Does not apply q1600   Continuous Infusions:  heparin 2,300 Units/hr (12/10/21 2221)   Diet Order             Diet regular Room service appropriate? Yes; Fluid consistency: Thin  Diet effective now                  Intake/Output Summary (Last 24 hours) at 12/11/2021 1110 Last data filed at 12/11/2021 0823 Gross per 24 hour  Intake 382.88 ml  Output 875  ml  Net -492.12 ml   Net IO Since Admission: -1,919.96 mL [12/11/21 1110]  Wt Readings from Last 3 Encounters:  12/08/21 121.7 kg  10/20/21 126.8 kg  10/07/21 124.1 kg     Unresulted Labs (From admission, onward)     Start     Ordered   12/12/21 0500  CBC  Daily,   R      12/11/21 1101   12/12/21 0500  APTT  Daily,   R      12/11/21 1101   12/11/21 0500  Protime-INR  Daily,   R      12/10/21 0951   12/11/21 0500  Heparin level (unfractionated)  Daily,   R      12/10/21 1932          Data Reviewed: I have personally reviewed following labs and imaging  studies CBC: Recent Labs  Lab 12/07/21 1445 12/08/21 0302 12/09/21 0216 12/10/21 0522  WBC 5.7 5.3 5.4 5.7  NEUTROABS 3.5  --   --   --   HGB 8.9* 8.8* 9.4* 10.5*  HCT 28.3* 28.5* 29.7* 34.7*  MCV 102.5* 101.4* 101.0* 104.5*  PLT 173 172 175 161   Basic Metabolic Panel: Recent Labs  Lab 12/07/21 1445 12/08/21 0302 12/09/21 0216 12/10/21 1603  NA 137 138 138 136  K 5.0 4.8 4.9 4.5  CL 104 108 107 104  CO2 22 22 23 23   GLUCOSE 138* 69* 98 115*  BUN 69* 64* 49* 35*  CREATININE 3.68* 3.64* 3.02* 2.63*  CALCIUM 7.4* 7.3* 7.5* 8.2*  MG  --   --   --  1.8   GFR: Estimated Creatinine Clearance: 39.4 mL/min (A) (by C-G formula based on SCr of 2.63 mg/dL (H)). Liver Function Tests: Recent Labs  Lab 12/08/21 0302  AST 19  ALT 11  ALKPHOS 89  BILITOT 0.4  PROT 7.6  ALBUMIN 3.1*   No results for input(s): LIPASE, AMYLASE in the last 168 hours. No results for input(s): AMMONIA in the last 168 hours. Coagulation Profile: Recent Labs  Lab 12/11/21 0522  INR 1.6*   Cardiac Enzymes: Recent Labs  Lab 12/07/21 1445  CKTOTAL 180   BNP (last 3 results) No results for input(s): PROBNP in the last 8760 hours. HbA1C: No results for input(s): HGBA1C in the last 72 hours. CBG: Recent Labs  Lab 12/10/21 0804 12/10/21 1210 12/10/21 1826 12/10/21 1943 12/11/21 0740  GLUCAP 112* 110* 114* 191* 107*   Lipid Profile: No results for input(s): CHOL, HDL, LDLCALC, TRIG, CHOLHDL, LDLDIRECT in the last 72 hours. Thyroid Function Tests: No results for input(s): TSH, T4TOTAL, FREET4, T3FREE, THYROIDAB in the last 72 hours. Anemia Panel: No results for input(s): VITAMINB12, FOLATE, FERRITIN, TIBC, IRON, RETICCTPCT in the last 72 hours. Sepsis Labs: No results for input(s): PROCALCITON, LATICACIDVEN in the last 168 hours.  Recent Results (from the past 240 hour(s))  Resp Panel by RT-PCR (Flu A&B, Covid) Nasopharyngeal Swab     Status: None   Collection Time: 12/07/21  3:20  PM   Specimen: Nasopharyngeal Swab; Nasopharyngeal(NP) swabs in vial transport medium  Result Value Ref Range Status   SARS Coronavirus 2 by RT PCR NEGATIVE NEGATIVE Final    Comment: (NOTE) SARS-CoV-2 target nucleic acids are NOT DETECTED.  The SARS-CoV-2 RNA is generally detectable in upper respiratory specimens during the acute phase of infection. The lowest concentration of SARS-CoV-2 viral copies this assay can detect is 138 copies/mL. A negative result does not preclude SARS-Cov-2 infection  and should not be used as the sole basis for treatment or other patient management decisions. A negative result may occur with  improper specimen collection/handling, submission of specimen other than nasopharyngeal swab, presence of viral mutation(s) within the areas targeted by this assay, and inadequate number of viral copies(<138 copies/mL). A negative result must be combined with clinical observations, patient history, and epidemiological information. The expected result is Negative.  Fact Sheet for Patients:  EntrepreneurPulse.com.au  Fact Sheet for Healthcare Providers:  IncredibleEmployment.be  This test is no t yet approved or cleared by the [place] FDA and  has been authorized for detection and/or diagnosis of SARS-CoV-2 by FDA under an Emergency Use Authorization (EUA). This EUA will remain  in effect (meaning this test can be used) for the duration of the COVID-19 declaration under Section 564(b)(1) of the Act, 21 U.S.C.section 360bbb-3(b)(1), unless the authorization is terminated  or revoked sooner.       Influenza A by PCR NEGATIVE NEGATIVE Final   Influenza B by PCR NEGATIVE NEGATIVE Final    Comment: (NOTE) The Xpert Xpress SARS-CoV-2/FLU/RSV plus assay is intended as an aid in the diagnosis of influenza from Nasopharyngeal swab specimens and should not be used as a sole basis for treatment. Nasal washings and aspirates are  unacceptable for Xpert Xpress SARS-CoV-2/FLU/RSV testing.  Fact Sheet for Patients: EntrepreneurPulse.com.au  Fact Sheet for Healthcare Providers: IncredibleEmployment.be  This test is not yet approved or cleared by the [place] FDA and has been authorized for detection and/or diagnosis of SARS-CoV-2 by FDA under an Emergency Use Authorization (EUA). This EUA will remain in effect (meaning this test can be used) for the duration of the COVID-19 declaration under Section 564(b)(1) of the Act, 21 U.S.C. section 360bbb-3(b)(1), unless the authorization is terminated or revoked.  Performed at [facility], [place] [address] [place], [place] [address]     Antimicrobials: Anti-infectives (From admission, onward)    Start     Dose/Rate Route Frequency Ordered Stop   12/10/21 1800  cephALEXin (KEFLEX) capsule 500 mg        500 mg Oral Every 6 hours 12/10/21 1256 12/14/21 1759   12/09/21 1400  ceFAZolin (ANCEF) IVPB 2g/100 mL premix  Status:  Discontinued        2 g 200 mL/hr over 30 Minutes Intravenous Every 8 hours 12/09/21 0706 12/10/21 1256   12/07/21 1800  ceFAZolin (ANCEF) IVPB 2g/100 mL premix  Status:  Discontinued        2 g 200 mL/hr over 30 Minutes Intravenous Every 12 hours 12/07/21 1654 12/09/21 0706   12/07/21 1630  ceFAZolin (ANCEF) IVPB 2g/100 mL premix  Status:  Discontinued        2 g 200 mL/hr over 30 Minutes Intravenous  Once 12/07/21 1627 12/07/21 1708      Culture/Microbiology    Component Value Date/Time   SDES  10/18/2021 0037    RIGHT ANTECUBITAL Performed at [facility], [place] [address], [place], [place] [address]    SDES  10/18/2021 0037    LEFT ANTECUBITAL Performed at [facility], [place] [address], [place], [place] [address]    Walker  10/18/2021 0037    BOTTLES DRAWN AEROBIC AND ANAEROBIC Blood Culture results may not be optimal due to an excessive  volume of blood received in culture bottles Performed at [facility], [place] [address], [place], [place] [address]    SPECREQUEST  10/18/2021 0037    BOTTLES  DRAWN AEROBIC AND ANAEROBIC Blood Culture adequate volume Performed at [place] [address] [place], [place] [address]    CULT  10/18/2021 0037    NO GROWTH 5 DAYS Performed at [place] [address] [place], [place] [address]    CULT  10/18/2021 0037    NO GROWTH 5 DAYS Performed at [facility], [place] [address], [place], [place] [address]    REPTSTATUS 10/23/2021 FINAL 10/18/2021 0037   REPTSTATUS 10/23/2021 FINAL 10/18/2021 0037  Radiology Studies: No results found.   LOS: 4 days   Antonieta Pert, MD [facility]  12/11/2021, 11:10 AM

## 2021-12-11 NOTE — Assessment & Plan Note (Addendum)
Episode of SVT in the 160s to 170s on 12/10/21-resolved w/ IV Lopressor.  Likely from not getting metoprolol- now back on home metoprolol.stable

## 2021-12-11 NOTE — TOC Initial Note (Signed)
Transition of Care [facility]) - Initial/Assessment Note    Patient Details  Name: Thomas [name] MRN: [id number] Date of Birth: 1951/03/07  Transition of Care [facility]) CM/SW Contact:    Trish Mage, LCSW Phone Number: 12/11/2021, 11:56 AM  Clinical Narrative:   Patient seen in follow up to recommendation of [place] for weekly blood checks for [place].  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 Has a brother in town who has a lot of medical issues as well.  No other supports.  Plans to return home as he has not had a reset for Part A to pay for SNF.  He will need weekly checks related to [place] as he has no transportation to get to a clinic.  If a [place] RN can do it temporarily, he could be educated to use a home INR monitoring machine himself - he would just need a prescription for one by PCP  He said he is used to checking his blood sugars at home. He will also need dressing changes twice a week.  Spoke with Cindie at [facility] who confirms they will be able to provide [facility] RN, PT OT. TOC will continue to follow during the course of hospitalization.              Expected Discharge Plan: [place] Barriers to Discharge: No Barriers Identified   Patient Goals and CMS Choice   CMS Medicare.gov Compare Post Acute Care list provided to:: Patient Choice offered to / list presented to : Patient  Expected Discharge Plan and Services Expected Discharge Plan: [place]   Discharge Planning Services: CM Consult Post Acute Care Choice: [place] arrangements for the past 2 months: Apartment                                      Prior Living Arrangements/Services Living arrangements for the past 2 months: Apartment Lives with:: Self Patient language and need for interpreter reviewed:: Yes        Need for Family Participation in Patient Care: Yes (Comment) Care giver support system in place?: No (comment)   Criminal Activity/Legal  Involvement Pertinent to Current Situation/Hospitalization: No - Comment as needed  Activities of Daily Living Home Assistive Devices/Equipment: Walker (specify type), Prosthesis ADL Screening (condition at time of admission) Patient's cognitive ability adequate to safely complete daily activities?: Yes Is the patient deaf or have difficulty hearing?: No Does the patient have difficulty seeing, even when wearing glasses/contacts?: No Does the patient have difficulty concentrating, remembering, or making decisions?: No Patient able to express need for assistance with ADLs?: Yes Does the patient have difficulty dressing or bathing?: No Independently performs ADLs?: No Does the patient have difficulty walking or climbing stairs?: Yes Weakness of Legs: Both Weakness of Arms/Hands: None  Permission Sought/Granted                  Emotional Assessment Appearance:: Appears stated age Attitude/Demeanor/Rapport: Engaged Affect (typically observed): Appropriate Orientation: : Oriented to Self, Oriented to Place, Oriented to Situation Alcohol / Substance Use: Not Applicable Psych Involvement: No (comment)  Admission diagnosis:  Cellulitis of left lower extremity [L03.116] Left leg cellulitis [L03.116] 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 (HCC) [I82.5Z2] Patient Active Problem List   Diagnosis Date Noted   SVT (supraventricular tachycardia) ([place]) 12/11/2021  Obesity, Class I, BMI 30-34.9 12/10/2021   Left leg cellulitis 71/03/2693   Acute metabolic encephalopathy 85/46/2703   Hyperkalemia 10/09/2021   Hypothyroidism 10/09/2021   Hydronephrosis of right kidney 10/09/2021   Hypoglycemia 10/07/2021   DVT (deep venous thrombosis) ([place]) 10/07/2021   ARF (acute renal failure) ([place]) 09/17/2021   CAP (community acquired pneumonia) 09/17/2021   Cellulitis of left lower extremity    Pressure injury of skin 06/03/2021   AKI (acute kidney injury) ([place])  06/02/2021   Pain management    Cord compression (HCC)    Drug-induced constipation    Acute midline low back pain with sciatica    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 ([place]) 08/30/2020   Urinary retention 12/04/2019   BPH (benign prostatic hyperplasia) 11/24/2019   CKD (chronic kidney disease), stage IV ([place]) 11/24/2019   Chronic pain syndrome 11/24/2019   Physical deconditioning 11/24/2019   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   Pulmonary emboli ([place]) 09/29/2018   [name], right ([place]) 09/29/2018   [name], type 2) ([place]) 09/29/2018   Gout 09/29/2018   HTN (hypertension) 09/29/2018   Chronic combined systolic and diastolic CHF (congestive heart failure) ([place]) 09/29/2018   GERD (gastroesophageal reflux disease) 09/29/2018   PCP:  Antonietta Jewel, MD Pharmacy:   [place], [place] [address] Phone: [phone] Fax: [phone]     Social Determinants of Health (SDOH) Interventions    Readmission Risk Interventions Readmission Risk Prevention Plan 09/20/2021 09/17/2019  Transportation Screening Complete Complete  PCP or Specialist Appt within 5-7 Days - Not Complete  Not Complete comments - snf vs home  PCP or Specialist Appt within 3-5 Days Complete -  Home Care Screening - Complete  Medication Review (RN CM) - Complete  HRI or Home Care Consult Complete -  Social Work Consult for [place] Planning/Counseling Complete -  Palliative Care Screening Not Applicable -  Medication Review (RN Care Manager) Complete -  Some recent data might be hidden

## 2021-12-12 ENCOUNTER — Telehealth: Payer: Self-pay | Admitting: Physician Assistant

## 2021-12-12 DIAGNOSIS — L03116 Cellulitis of left lower limb: Secondary | ICD-10-CM | POA: Diagnosis not present

## 2021-12-12 LAB — PROTIME-INR
INR: 1.7 — ABNORMAL HIGH (ref 0.8–1.2)
Prothrombin Time: 20.1 seconds — ABNORMAL HIGH (ref 11.4–15.2)

## 2021-12-12 LAB — GLUCOSE, CAPILLARY
Glucose-Capillary: 121 mg/dL — ABNORMAL HIGH (ref 70–99)
Glucose-Capillary: 117 mg/dL — ABNORMAL HIGH (ref 70–99)
Glucose-Capillary: 146 mg/dL — ABNORMAL HIGH (ref 70–99)
Glucose-Capillary: 190 mg/dL — ABNORMAL HIGH (ref 70–99)

## 2021-12-12 LAB — CBC
HCT: 31.5 % — ABNORMAL LOW (ref 39.0–52.0)
Hemoglobin: 9.9 g/dL — ABNORMAL LOW (ref 13.0–17.0)
MCH: 31.5 pg (ref 26.0–34.0)
MCHC: 31.4 g/dL (ref 30.0–36.0)
MCV: 100.3 fL — ABNORMAL HIGH (ref 80.0–100.0)
Platelets: 180 10*3/uL (ref 150–400)
RBC: 3.14 MIL/uL — ABNORMAL LOW (ref 4.22–5.81)
RDW: 16.1 % — ABNORMAL HIGH (ref 11.5–15.5)
WBC: 5.1 10*3/uL (ref 4.0–10.5)
nRBC: 0 % (ref 0.0–0.2)

## 2021-12-12 LAB — HEPARIN LEVEL (UNFRACTIONATED): Heparin Unfractionated: 0.99 IU/mL — ABNORMAL HIGH (ref 0.30–0.70)

## 2021-12-12 LAB — APTT
aPTT: 111 seconds — ABNORMAL HIGH (ref 24–36)
aPTT: 101 seconds — ABNORMAL HIGH (ref 24–36)

## 2021-12-12 MED ORDER — HEPARIN (PORCINE) 25000 UT/250ML-% IV SOLN
2200.0000 [IU]/h | INTRAVENOUS | Status: DC
  Administered 2021-12-12: 2200 [IU]/h via INTRAVENOUS
  Filled 2021-12-12 (×3): qty 250

## 2021-12-12 MED ORDER — WARFARIN SODIUM 5 MG PO TABS
5.0000 mg | ORAL_TABLET | Freq: Once | ORAL | Status: AC
  Administered 2021-12-12: 5 mg via ORAL
  Filled 2021-12-12: qty 1

## 2021-12-12 MED ORDER — HEPARIN (PORCINE) 25000 UT/250ML-% IV SOLN
2150.0000 [IU]/h | INTRAVENOUS | Status: DC
  Administered 2021-12-13 – 2021-12-15 (×4): 2150 [IU]/h via INTRAVENOUS
  Filled 2021-12-12 (×4): qty 250

## 2021-12-12 NOTE — Progress Notes (Signed)
Post for Eliquis >> Heparin/Warfarin Indication: chronic DVT, possible Eliquis failure  Allergies  Allergen Reactions   Other Other (See Comments)    Blood pressure issues Per [facility]: Pt states he can only take these pain meds or else he gets very sick: Oxycontin,morphine,demerol, and dilaudid are the only pain meds pt states he can take.     Oxymorphone Other (See Comments)    Causes kidney problems   Aspirin Nausea And Vomiting    Per [facility]   Beta Vulgaris Nausea And Vomiting    Beets   Buspirone Nausea And Vomiting and Other (See Comments)    Affected his head   Cabbage Nausea And Vomiting   Codeine Other (See Comments)    Unknown reaction   Dolobid [Diflunisal] Other (See Comments)    Unknown reaction   Fish Allergy Nausea And Vomiting   Fish-Derived Products Nausea And Vomiting   Methadone Other (See Comments)    Made him loopy   Pentazocine Other (See Comments)    Unknown reaction   Propoxyphene Nausea And Vomiting and Other (See Comments)    Messed with his head   Shellfish Allergy Nausea And Vomiting   Sulfa Antibiotics Hives   Sulfasalazine Other (See Comments)    Unknown reaction   Vistaril [Hydroxyzine] Nausea And Vomiting and Other (See Comments)    Didn't help   Amoxicillin Rash    Per [facility]    Patient Measurements: Height: 6\' 6"  (198.1 cm) Weight: 121.7 kg (268 lb 5.5 oz) IBW/kg (Calculated) : 91.4 Heparin Dosing Weight: 116 kg  Vital Signs: Temp: 98.1 F (36.7 C) (02/23 0408) Temp Source: Oral (02/23 0408) BP: 151/103 (02/23 0408) Pulse Rate: 78 (02/23 0408)  Labs: Recent Labs    12/10/21 0522 12/10/21 1603 12/10/21 1853 12/11/21 0522 12/11/21 0914 12/12/21 0750  HGB 10.5*  --   --   --   --  9.9*  HCT 34.7*  --   --   --   --  31.5*  PLT 178  --   --   --   --  180  APTT  --   --  63* 78* 91*  --   LABPROT  --   --   --  19.5*  --   --   INR  --   --   --   1.6*  --   --   HEPARINUNFRC  --   --  >1.10* >1.10*  --  0.99*  CREATININE  --  2.63*  --   --   --   --      Estimated Creatinine Clearance: 39.4 mL/min (A) (by C-G formula based on SCr of 2.63 mg/dL (H)).  Assessment: Pharmacy consulted on admission to dose heparin for DVT worsening on apixaban therapy. On apixaban 5 mg po BID PTA - last dose 2/18 at 0730.    Transitioned back to apixaban 2/20 PM when determined that no vascular surgery planned.  Today, 2/21, transitioning apixaban to heparin/warfarin since considering DVT propagation as apixaban failure.   12/12/21  Heparin level = 0.99, trending down but may still be showing some residual effects from apixaban aPTT = 111, supra-therapeutic on 2300 units/hr CBC: Hgb 9.9 low but stable, Plts wnl INR = 1.7 after two doses warfarin: 7.5mg , 5mg  SCr  No bleeding documented No major drug-drug interactions note  Goal of Therapy:  Heparin level 0.3-0.7 units/ml aPTT 66-102 seconds INR 2-3 Monitor platelets by anticoagulation protocol:  Yes   Plan:  Decrease heparin infusion at 2200 units/hr - recheck aPTT in 8hr Warfarin 5mg  PO x 1 at 16:00 Check aPTT and daily HL - HL may be falsely elevated with recent apixaban dose and elevated SCr.  Once aPTT and HL correlate, can monitor heparin using HL only Daily PT/INR for warfarin dosing  Monitor CBC daily and signs/symptoms of bleeding   Peggyann Juba, PharmD, BCPS Pharmacy: [phone] 12/12/2021, 8:54 AM

## 2021-12-12 NOTE — Telephone Encounter (Signed)
Scheduled appt per 2/21 referral. Tried to contact pt using both is personal number and the hospital room number. Was not able to reach pt using either number. Spoke to pt's brother who asked me to try and call the hospital to sch an appt. Will attempt to contact pt again at a later time.

## 2021-12-12 NOTE — Progress Notes (Signed)
Brief Hematology note:  Patient has been scheduled for a new patient appt with hematology - see details below. Appointments will print on AVS.   Future Appointments  Date Time Provider [place]  12/24/2021  9:00 AM Lincoln Brigham, PA-C [facility] None  12/24/2021 10:00 AM [facility] LAB [facility] None

## 2021-12-12 NOTE — Plan of Care (Signed)

## 2021-12-12 NOTE — Progress Notes (Signed)
Brief Pharmacy Note:  Pharmacy Consult for Apixaban >> Heparin/Warfarin Indication: chronic DVT, possible Apixaban failure   Assessment: Pharmacy consulted on admission to dose heparin for DVT worsening on apixaban therapy. On apixaban 5 mg po BID PTA - last dose 2/18 at 0730.     Transitioned back to apixaban 2/20 PM when determined that no vascular surgery planned. On 2/21, apixaban transitioned to heparin/warfarin since considering DVT propagation as apixaban failure.  PM aPTT 101 seconds, on upper end of therapeutic range CBC: Hgb 9.9, low but stable. Pltc WNL.  No infusion issues or IV site issues per RN IV heparin infusing in R arm, aPTT collected from L hand No bleeding issues noted per nursing   Plan: Decrease heparin infusion slightly to 2150 units/hr aPTT and heparin level 8 hours after rate change Heparin level may be falsely elevated with recent apixaban dose and elevated SCr. Once aPTT and heparin level correlate, can monitor using heparin levels only Daily PT/INR for warfarin dosing Daily CBC Monitor closely for s/sx of bleeding   Lindell Spar, PharmD, BCPS Clinical Pharmacist 12/12/2021 8:26 PM

## 2021-12-12 NOTE — Progress Notes (Signed)
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  Found to have cellulitis w/ edematous and warm appearing left lower extremity started on antibiotics, heparin drip and admission requested for further management. Seen by vascular felt this as an anticoagulation failure, recommend Unna boot and compression stockings with wound care, patient removed Unna boot after placement as unable to tolerate. Repeat ABI within normal limits. PTOT has seen and advised home health PT.  Which he has refused patient also reports placement.  Discussed with Dr. Irene Limbo from hematology about overall condition he advised to switch to Coumadin with heparin bridge given his anticoagulation failure, renal dysfunction    Subjective: Seen and examined.  Patient was sleeping woke up after calling Complains of some pain in his left lower leg.  Overnight blood  pressure stable afebrile.  Assessment and Plan: * Left leg cellulitis- (present on admission) Much improved.  Continue Keflex to complete the course.  Continue wound care, wound on board if he allows, seen by vascular ABI normal will need close outpatient follow-up   DVT (deep venous thrombosis) ([place])- (present on admission) DVT Lt LE w/ more proximal propagation this admission Hx of PE: Seen by vascular felt this as an anticoagulation failure, recommend Unna boot and compression stockings wound care-anticoagulation with Lovenox I discussed with Dr. Irene Limbo from hematology about overall condition he advised to switch to Coumadin with heparin bridge given his anticoagulation failure and renal dysfunction.Pharmacy dosing heparin and Coumadin, monitor INR daily.  Patient has been on Coumadin before and is agreeable.  He will need to have a PCP follow-up/home health with INR monitoring-and is trending up Recent Labs  Lab 12/11/21 0522 12/12/21 0750  INR 1.6* 1.7*     SVT (supraventricular tachycardia) ([place]) Episode of SVT in the 160s to 170s on 12/10/21-resolved w/ IV Lopressor.  Likely from not getting metoprolol- now back on home metoprolol.  Obesity, Class I, BMI 30-34.9 Will benefit with PCP follow-up, weight loss  healthy lifestyle and outpatient sleep evaluation.  Hypothyroidism- (present on admission) cont sythroid  Physical deconditioning- (present on admission) Continue with PT OT.Refusing placement/HH.  Chronic pain syndrome- (present on admission) Continue current oxycodone pain management.  Minimize IV narcotics  CKD (chronic kidney disease), stage IV ([place])- (present on admission) AKI on CKD stage IV versus progression of CKD stage IV: baseline creatinine on last discharge 2.7 on 1/2, was 3.5 prior to that. Currently at 2.6, stable. Avoid nephrotoxic medication.  Monitor Recent Labs  Lab 12/07/21 1445 12/08/21 0302 12/09/21 0216 12/10/21 1603  BUN 69*  9* 75* 35*   CREATININE 3.68* 3.64* 3.02* 2.63*     BPH (benign prostatic hyperplasia)- (present on admission) Continue Flomax Proscar  GERD (gastroesophageal reflux disease)- (present on admission) Continue PPI  Chronic combined systolic and diastolic CHF (congestive heart failure) ([place])- (present on admission) Not in failure currently.Resume home meds upon discharge  HTN (hypertension)- (present on admission) Blood pressure improved after resuming home clonidine amlodipine, metoprolol   Gout- (present on admission) cont home colchicine and allopurinol.  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 :  DVT prophylaxis: HEPARIN.COUMADIN Code Status:   Code Status: Full Code Family Communication: plan of care discussed with patient at bedside.  Disposition: Currently NOT medically stable for discharge. Status is: Inpatient Remains inpatient appropriate because: For management of DVT with heparin and transition to Coumadin. WILL NEED 2-3 DAYS  Objective: Vitals last 24 hrs: Vitals:   12/11/21 0515 12/11/21 1532 12/11/21 2031 12/12/21 0408  BP: (!) 149/99 (!) 159/95 122/75 (!) 151/103  Pulse: 75 80 87 78  Resp: 10 19 14 20   Temp: 98.8 F (37.1 C) 98.2 F (36.8 C) 98.4 F (36.9 C) 98.1 F (36.7 C)  TempSrc: Oral  Oral Oral  SpO2: 94% 95% 90% 95%  Weight:      Height:       Weight change:   Physical Examination: General exam: AAox3, older than stated age, weak appearing. HEENT:Oral mucosa moist, Ear/Nose WNL grossly, dentition normal. Respiratory system: bilaterally diminished, no use of accessory muscle Cardiovascular system: S1 & S2 +, No JVD,. Gastrointestinal system: Abdomen soft,NT,ND,BS+ Nervous  System:Alert, awake, moving extremities and grossly nonfocal Extremities: Lt LE w/ dressing in place less tender improved erythema/swelling, rt bka. Skin: No rashes,no icterus. MSK: Normal muscle bulk,tone, power   Medications reviewed:  Scheduled Meds:  allopurinol  100 mg Oral Daily   amLODipine  5 mg Oral Daily   bisacodyl  10 mg Rectal QHS   cephALEXin  500 mg Oral Q6H   cloNIDine  0.1 mg Oral TID   colchicine  0.6 mg Oral Daily   ferrous sulfate  325 mg Oral Q breakfast   finasteride  5 mg Oral Daily   gabapentin  300 mg Oral TID   insulin aspart  0-6 Units Subcutaneous TID WC   levothyroxine  50 mcg Oral QAC breakfast   metoprolol tartrate  50 mg Oral Daily   oxyCODONE  15 mg Oral Q12H   polyethylene glycol  17 g Oral Daily   senna-docusate  1 tablet Oral BID   tamsulosin  0.4 mg Oral Daily   warfarin  5 mg Oral ONCE-1600   Warfarin - Pharmacist Dosing Inpatient   Does not apply q1600   Continuous Infusions:  heparin 2,200 Units/hr (12/12/21 1041)   Diet Order             Diet regular Room service appropriate? Yes; Fluid consistency: Thin  Diet effective now                  Intake/Output Summary (Last 24 hours) at 12/12/2021 1042 Last data filed at 12/12/2021 0856 Gross per 24 hour  Intake 1072 ml  Output 1050 ml  Net 22 ml   Net  IO Since Admission: -1,897.96 mL [12/12/21 1042]  Wt Readings from Last 3 Encounters:  12/08/21 121.7 kg  10/20/21 126.8 kg  10/07/21 124.1 kg     Unresulted Labs (From admission, onward)     Start     Ordered   12/12/21 0500  CBC  Daily,   R      12/11/21 1101   12/12/21 0500  APTT  Daily,   R      12/11/21 1101   12/11/21 0500  Protime-INR  Daily,   R      12/10/21 0951   12/11/21 0500  Heparin level (unfractionated)  Daily,   R      12/10/21 1932          Data Reviewed: I have personally reviewed following labs and imaging studies CBC: Recent Labs  Lab 12/07/21 1445 12/08/21 0302 12/09/21 0216 12/10/21 0522  12/12/21 0750  WBC 5.7 5.3 5.4 5.7 5.1  NEUTROABS 3.5  --   --   --   --   HGB 8.9* 8.8* 9.4* 10.5* 9.9*  HCT 28.3* 28.5* 29.7* 34.7* 31.5*  MCV 102.5* 101.4* 101.0* 104.5* 100.3*  PLT 173 172 175 178 992   Basic Metabolic Panel: Recent Labs  Lab 12/07/21 1445 12/08/21 0302 12/09/21 0216 12/10/21 1603  NA 137 138 138 136  K 5.0 4.8 4.9 4.5  CL 104 108 107 104  CO2 22 22 23 23   GLUCOSE 138* 69* 98 115*  BUN 69* 64* 49* 35*  CREATININE 3.68* 3.64* 3.02* 2.63*  CALCIUM 7.4* 7.3* 7.5* 8.2*  MG  --   --   --  1.8   GFR: Estimated Creatinine Clearance: 39.4 mL/min (A) (by C-G formula based on SCr of 2.63 mg/dL (H)). Liver Function Tests: Recent Labs  Lab 12/08/21 0302  AST 19  ALT 11  ALKPHOS 89  BILITOT 0.4  PROT 7.6  ALBUMIN 3.1*   No results for input(s): LIPASE, AMYLASE in the last 168 hours. No results for input(s): AMMONIA in the last 168 hours. Coagulation Profile: Recent Labs  Lab 12/11/21 0522 12/12/21 0750  INR 1.6* 1.7*   Cardiac Enzymes: Recent Labs  Lab 12/07/21 1445  CKTOTAL 180   BNP (last 3 results) No results for input(s): PROBNP in the last 8760 hours. HbA1C: No results for input(s): HGBA1C in the last 72 hours. CBG: Recent Labs  Lab 12/11/21 0740 12/11/21 1225 12/11/21 1728 12/11/21 2039 12/12/21 0731  GLUCAP 107* 99 148* 139* 121*   Lipid Profile: No results for input(s): CHOL, HDL, LDLCALC, TRIG, CHOLHDL, LDLDIRECT in the last 72 hours. Thyroid Function Tests: No results for input(s): TSH, T4TOTAL, FREET4, T3FREE, THYROIDAB in the last 72 hours. Anemia Panel: No results for input(s): VITAMINB12, FOLATE, FERRITIN, TIBC, IRON, RETICCTPCT in the last 72 hours. Sepsis Labs: No results for input(s): PROCALCITON, LATICACIDVEN in the last 168 hours.  Recent Results (from the past 240 hour(s))  Resp Panel by RT-PCR (Flu A&B, Covid) Nasopharyngeal Swab     Status: None   Collection Time: 12/07/21  3:20 PM   Specimen: Nasopharyngeal  Swab; Nasopharyngeal(NP) swabs in vial transport medium  Result Value Ref Range Status   SARS Coronavirus 2 by RT PCR NEGATIVE NEGATIVE Final    Comment: (NOTE) SARS-CoV-2 target nucleic acids are NOT DETECTED.  The SARS-CoV-2 RNA is generally detectable in upper respiratory specimens during the acute phase of infection. The lowest concentration of SARS-CoV-2 viral copies this assay can detect is 138 copies/mL. A negative result  does not preclude SARS-Cov-2 infection and should not be used as the sole basis for treatment or other patient management decisions. A negative result may occur with  improper specimen collection/handling, submission of specimen other than nasopharyngeal swab, presence of viral mutation(s) within the areas targeted by this assay, and inadequate number of viral copies(<138 copies/mL). A negative result must be combined with clinical observations, patient history, and epidemiological information. The expected result is Negative.  Fact Sheet for Patients:  EntrepreneurPulse.com.au  Fact Sheet for Healthcare Providers:  IncredibleEmployment.be  This test is no t yet approved or cleared by the [place] FDA and  has been authorized for detection and/or diagnosis of SARS-CoV-2 by FDA under an Emergency Use Authorization (EUA). This EUA will remain  in effect (meaning this test can be used) for the duration of the COVID-19 declaration under Section 564(b)(1) of the Act, 21 U.S.C.section 360bbb-3(b)(1), unless the authorization is terminated  or revoked sooner.       Influenza A by PCR NEGATIVE NEGATIVE Final   Influenza B by PCR NEGATIVE NEGATIVE Final    Comment: (NOTE) The Xpert Xpress SARS-CoV-2/FLU/RSV plus assay is intended as an aid in the diagnosis of influenza from Nasopharyngeal swab specimens and should not be used as a sole basis for treatment. Nasal washings and aspirates are unacceptable for Xpert Xpress  SARS-CoV-2/FLU/RSV testing.  Fact Sheet for Patients: EntrepreneurPulse.com.au  Fact Sheet for Healthcare Providers: IncredibleEmployment.be  This test is not yet approved or cleared by the [place] FDA and has been authorized for detection and/or diagnosis of SARS-CoV-2 by FDA under an Emergency Use Authorization (EUA). This EUA will remain in effect (meaning this test can be used) for the duration of the COVID-19 declaration under Section 564(b)(1) of the Act, 21 U.S.C. section 360bbb-3(b)(1), unless the authorization is terminated or revoked.  Performed at [facility], [place] [address] [place], [place] [address]     Antimicrobials: Anti-infectives (From admission, onward)    Start     Dose/Rate Route Frequency Ordered Stop   12/10/21 1800  cephALEXin (KEFLEX) capsule 500 mg        500 mg Oral Every 6 hours 12/10/21 1256 12/14/21 1759   12/09/21 1400  ceFAZolin (ANCEF) IVPB 2g/100 mL premix  Status:  Discontinued        2 g 200 mL/hr over 30 Minutes Intravenous Every 8 hours 12/09/21 0706 12/10/21 1256   12/07/21 1800  ceFAZolin (ANCEF) IVPB 2g/100 mL premix  Status:  Discontinued        2 g 200 mL/hr over 30 Minutes Intravenous Every 12 hours 12/07/21 1654 12/09/21 0706   12/07/21 1630  ceFAZolin (ANCEF) IVPB 2g/100 mL premix  Status:  Discontinued        2 g 200 mL/hr over 30 Minutes Intravenous  Once 12/07/21 1627 12/07/21 1708      Culture/Microbiology    Component Value Date/Time   SDES  10/18/2021 0037    RIGHT ANTECUBITAL Performed at [facility], [place] [address], [place], [place] [address]    SDES  10/18/2021 0037    LEFT ANTECUBITAL Performed at [facility], [place] [address], [place], [place] [address]    [place]  10/18/2021 0037    BOTTLES DRAWN AEROBIC AND ANAEROBIC Blood Culture results may not be optimal due to an excessive volume of blood received in  culture bottles Performed at [facility], [place] [address], [place], [place] [address]    SPECREQUEST  10/18/2021  0037    BOTTLES DRAWN AEROBIC AND ANAEROBIC Blood Culture adequate volume Performed at [place] [address] [place], [place] [address]    CULT  10/18/2021 0037    NO GROWTH 5 DAYS Performed at [facility] [address], [place], [place] [address]    CULT  10/18/2021 0037    NO GROWTH 5 DAYS Performed at [facility], [place] [address] [place],  [address]    REPTSTATUS 10/23/2021 FINAL 10/18/2021 0037   REPTSTATUS 10/23/2021 FINAL 10/18/2021 0037  Radiology Studies: No results found.   LOS: 5 days   Antonieta Pert, MD [facility]  12/12/2021, 10:42 AM

## 2021-12-13 DIAGNOSIS — L03116 Cellulitis of left lower limb: Secondary | ICD-10-CM | POA: Diagnosis not present

## 2021-12-13 LAB — CBC
HCT: 31.7 % — ABNORMAL LOW (ref 39.0–52.0)
Hemoglobin: 10.1 g/dL — ABNORMAL LOW (ref 13.0–17.0)
MCH: 32.6 pg (ref 26.0–34.0)
MCHC: 31.9 g/dL (ref 30.0–36.0)
MCV: 102.3 fL — ABNORMAL HIGH (ref 80.0–100.0)
Platelets: 194 10*3/uL (ref 150–400)
RBC: 3.1 MIL/uL — ABNORMAL LOW (ref 4.22–5.81)
RDW: 16 % — ABNORMAL HIGH (ref 11.5–15.5)
WBC: 5.1 10*3/uL (ref 4.0–10.5)
nRBC: 0 % (ref 0.0–0.2)

## 2021-12-13 LAB — HEPARIN LEVEL (UNFRACTIONATED): Heparin Unfractionated: 0.59 IU/mL (ref 0.30–0.70)

## 2021-12-13 LAB — GLUCOSE, CAPILLARY
Glucose-Capillary: 135 mg/dL — ABNORMAL HIGH (ref 70–99)
Glucose-Capillary: 121 mg/dL — ABNORMAL HIGH (ref 70–99)
Glucose-Capillary: 133 mg/dL — ABNORMAL HIGH (ref 70–99)
Glucose-Capillary: 182 mg/dL — ABNORMAL HIGH (ref 70–99)

## 2021-12-13 LAB — PROTIME-INR
INR: 1.7 — ABNORMAL HIGH (ref 0.8–1.2)
Prothrombin Time: 20.4 seconds — ABNORMAL HIGH (ref 11.4–15.2)

## 2021-12-13 LAB — APTT: aPTT: 96 seconds — ABNORMAL HIGH (ref 24–36)

## 2021-12-13 MED ORDER — LACTULOSE 10 GM/15ML PO SOLN
10.0000 g | Freq: Two times a day (BID) | ORAL | Status: DC | PRN
  Administered 2021-12-13 – 2021-12-14 (×2): 10 g via ORAL
  Filled 2021-12-13 (×2): qty 15

## 2021-12-13 MED ORDER — WARFARIN SODIUM 5 MG PO TABS
7.5000 mg | ORAL_TABLET | Freq: Once | ORAL | Status: AC
  Administered 2021-12-13: 7.5 mg via ORAL
  Filled 2021-12-13: qty 1

## 2021-12-13 MED ORDER — FUROSEMIDE 40 MG PO TABS
40.0000 mg | ORAL_TABLET | Freq: Every day | ORAL | Status: DC
  Administered 2021-12-13 – 2021-12-16 (×4): 40 mg via ORAL
  Filled 2021-12-13 (×4): qty 1

## 2021-12-13 NOTE — Discharge Planning (Signed)
Oncology Discharge Planning Note  [facility] at [facility] Address: [place], [place], [place] [address] Hours of Operation:  Nena Polio, Monday - Friday  Clinic Contact Information:  [phone]) [phone]  Oncology Care Team: Medical Oncologist:  Dede Query PA-C  Patient Details: Name:  Thomas [name], Thomas [name] MRN:   [id number] DOB:   1951-07-15 Reason for Current Admission: Left leg cellulitis  Discharge Planning Narrative: Discharge follow-up appointments for oncology are current and available on the AVS and MyChart.   Upon discharge from the hospital, hematology/oncology's post discharge plan of care for the outpatient setting is: 12/24/21 with labs and new patient follow up with Dede Query, PA-C.  Gadge Hermiz will be called within two business days after discharge to review hematology/oncology's plan of care for full understanding.    Outpatient Oncology Specific Care Only: Oncology appointment transportation needs addressed?:  not applicable Oncology medication management for symptom management addressed?:  not applicable Chemo Alert Card reviewed?:  not applicable Immunotherapy Alert Card reviewed?:  not applicable

## 2021-12-13 NOTE — Progress Notes (Signed)
[facility] for Eliquis >> Heparin/Warfarin Indication: chronic DVT, possible Eliquis failure  Allergies  Allergen Reactions   Other Other (See Comments)    Blood pressure issues Per [facility] hospital: Pt states he can only take these pain meds or else he gets very sick: Oxycontin,morphine,demerol, and dilaudid are the only pain meds pt states he can take.     Oxymorphone Other (See Comments)    Causes kidney problems   Aspirin Nausea And Vomiting    Per [facility]   Beta Vulgaris Nausea And Vomiting    Beets   Buspirone Nausea And Vomiting and Other (See Comments)    Affected his head   Cabbage Nausea And Vomiting   Codeine Other (See Comments)    Unknown reaction   Dolobid [Diflunisal] Other (See Comments)    Unknown reaction   Fish Allergy Nausea And Vomiting   Fish-Derived Products Nausea And Vomiting   Methadone Other (See Comments)    Made him loopy   Pentazocine Other (See Comments)    Unknown reaction   Propoxyphene Nausea And Vomiting and Other (See Comments)    Messed with his head   Shellfish Allergy Nausea And Vomiting   Sulfa Antibiotics Hives   Sulfasalazine Other (See Comments)    Unknown reaction   Vistaril [Hydroxyzine] Nausea And Vomiting and Other (See Comments)    Didn't help   Amoxicillin Rash    Per [facility]    Patient Measurements: Height: 6\' 6"  (198.1 cm) Weight: 121.7 kg (268 lb 5.5 oz) IBW/kg (Calculated) : 91.4 Heparin Dosing Weight: 116 kg  Vital Signs: Temp: 98.4 F (36.9 C) (02/24 0451) Temp Source: Oral (02/24 0451) BP: 163/93 (02/24 0451) Pulse Rate: 63 (02/24 0451)  Labs: Recent Labs    12/10/21 1603 12/10/21 1853 12/11/21 0522 12/11/21 0914 12/12/21 0750 12/12/21 1917 12/13/21 0558  HGB  --   --   --   --  9.9*  --  10.1*  HCT  --   --   --   --  31.5*  --  31.7*  PLT  --   --   --   --  180  --  194  APTT  --    < > 78*   < > 111* 101* 96*  LABPROT  --   --  19.5*   --  20.1*  --  20.4*  INR  --   --  1.6*  --  1.7*  --  1.7*  HEPARINUNFRC  --    < > >1.10*  --  0.99*  --  0.59  CREATININE 2.63*  --   --   --   --   --   --    < > = values in this interval not displayed.     Estimated Creatinine Clearance: 39.4 mL/min (A) (by C-G formula based on SCr of 2.63 mg/dL (H)).  Assessment: Pharmacy consulted on admission to dose heparin for DVT worsening on apixaban therapy. On apixaban 5 mg po BID PTA - last dose 2/18 at 0730.    Transitioned back to apixaban 2/20 PM when determined that no vascular surgery planned.  Today, 2/21, transitioning apixaban to heparin/warfarin since considering DVT propagation as apixaban failure.   12/13/21  Heparin level = 0.59 aPTT = 96, therapeutic on 2150 units/hr CBC: Hgb 10.1 low but improved, Plts wnl INR = 1.7 after three doses warfarin: 7.5mg , 5mg , 5mg  SCr 2.63 on 2/21 No bleeding documented No major drug-drug  interactions note  Goal of Therapy:  Heparin level 0.3-0.7 units/ml aPTT 66-102 seconds INR 2-3 Monitor platelets by anticoagulation protocol: Yes   Plan:  Continue heparin infusion at 2150 units/hr Increase Warfarin 7.5mg  PO x 1 at 16:00 Since aPTT and HL correlating, can monitor heparin using HL only Daily PT/INR for warfarin dosing  Monitor CBC daily and signs/symptoms of bleeding   Peggyann Juba, PharmD, BCPS Pharmacy: [phone] 12/13/2021, 6:54 AM

## 2021-12-13 NOTE — Care Management Important Message (Signed)
Important Message  Patient Details IM Letter placed in Patients room. Name: Thomas [name] MRN: [id number] Date of Birth: April 18, 1951   Medicare Important Message Given:  Yes     Kerin Salen 12/13/2021, 11:07 AM

## 2021-12-13 NOTE — Plan of Care (Signed)
°  Problem: Nutrition: °Goal: Adequate nutrition will be maintained °Outcome: Progressing °  °Problem: Coping: °Goal: Level of anxiety will decrease °Outcome: Progressing °  °Problem: Safety: °Goal: Ability to remain free from injury will improve °Outcome: Progressing °  °

## 2021-12-13 NOTE — Progress Notes (Signed)
Occupational Therapy Treatment Patient Details Name: Thomas [name] MRN: [id number] DOB: 01-08-1951 Today's Date: 12/13/2021   History of present illness 71 yo male admitted with L LE cellulitis, worsening L LE DVT. Hx of chronic pain, back sg, DVT, PE, DM, CKD, R BKA, HF, gout   OT comments  Patient declines donning prosthesis and attempt standing 2* L LE pain. With set up assist patient able to scoot from bed to drop arm recliner without assist. Set up assist for upper and lower body bathing, patient able to lean laterally to wash perianal area. Set up to don clean gown. Patient progressing well with acute OT goals, would encourage attempts at standing with prosthesis in future session.   Recommendations for follow up therapy are one component of a multi-disciplinary discharge planning process, led by the attending physician.  Recommendations may be updated based on patient status, additional functional criteria and insurance authorization.    Follow Up Recommendations  Home health OT    Assistance Recommended at Discharge Intermittent Supervision/Assistance  Patient can return home with the following  A little help with walking and/or transfers;A little help with bathing/dressing/bathroom;Help with stairs or ramp for entrance   Equipment Recommendations  None recommended by OT       Precautions / Restrictions Precautions Precautions: Fall Precaution Comments: R BKA-has prosthesis Restrictions Weight Bearing Restrictions: No       Mobility Bed Mobility Overal bed mobility: Modified Independent                     Balance Overall balance assessment: Needs assistance Sitting-balance support: Feet supported Sitting balance-[name]: Good                                     ADL either performed or assessed with clinical judgement   ADL Overall ADL's : Needs assistance/impaired         Upper Body Bathing: Set up;Sitting Upper Body Bathing  Details (indicate cue type and reason): In recliner chair Lower Body Bathing: Set up;Sitting/lateral leans Lower Body Bathing Details (indicate cue type and reason): Patient able to lean side to side in chair in order to wash peri area Upper Body Dressing : Set up;Sitting Upper Body Dressing Details (indicate cue type and reason): To don clean gown after bathing     Toilet Transfer: Copy Details (indicate cue type and reason): Patient able to scoot from bed to drop arm recliner chair at supervision level. Declined attempt at standing with prothesis 2* leg pain                  Cognition Arousal/Alertness: Awake/alert Behavior During Therapy: WFL for tasks assessed/performed Overall Cognitive Status: Within Functional Limits for tasks assessed                                                     Pertinent Vitals/ Pain       Pain Assessment Pain Assessment: Faces Faces Pain Scale: Hurts even more Pain Location: L Le Pain Descriptors / Indicators: Aching Pain Intervention(s): Monitored during session, Patient requesting pain meds-RN notified         Frequency  Min 2X/week        Progress Toward Goals  OT Goals(current  goals can now be found in the care plan section)  Progress towards OT goals: Progressing toward goals  Acute Rehab OT Goals Patient Stated Goal: Get strength back in legs OT Goal Formulation: With patient Time For Goal Achievement: 12/24/21 Potential to Achieve Goals: Good ADL Goals Pt Will Perform Lower Body Dressing: with modified independence;sit to/from stand;sitting/lateral leans Pt Will Transfer to Toilet: with modified independence;ambulating;regular height toilet Pt Will Perform Toileting - Clothing Manipulation and hygiene: with modified independence;sitting/lateral leans;sit to/from stand Additional ADL Goal #1: Patient will perform 10 min functional activity or exercise activity as  evidence of improving activity tolerance  Plan Discharge plan needs to be updated       AM-PAC OT "6 Clicks" Daily Activity     Outcome Measure   Help from another person eating meals?: None Help from another person taking care of personal grooming?: None Help from another person toileting, which includes using toliet, bedpan, or urinal?: A Little Help from another person bathing (including washing, rinsing, drying)?: A Little Help from another person to put on and taking off regular upper body clothing?: None Help from another person to put on and taking off regular lower body clothing?: A Little 6 Click Score: 21    End of Session  OT Visit Diagnosis: Unsteadiness on feet (R26.81)   Activity Tolerance Patient tolerated treatment well   Patient Left in chair;with call bell/phone within reach;with chair alarm set   Nurse Communication Mobility status;Patient requests pain meds        Time: 0981-1914 OT Time Calculation (min): 29 min  Charges: OT General Charges $OT Visit: 1 Visit OT Treatments $Self Care/Home Management : 23-37 mins  Delbert Phenix OT OT pager: [place] 12/13/2021, 11:14 AM

## 2021-12-13 NOTE — Progress Notes (Signed)
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  Found to have cellulitis w/ edematous and warm appearing left lower extremity started on antibiotics, heparin drip and admission requested for further management. Seen by vascular felt this as an anticoagulation failure, recommend Unna boot and compression stockings with wound care, patient removed Unna boot after placement as unable to tolerate. Repeat ABI within normal limits. PTOT has seen and advised home health PT.  Which he has refused patient also reports placement.  Discussed with Dr. Irene Limbo from hematology about overall condition he advised to switch to Coumadin with heparin bridge given his anticoagulation failure, renal dysfunction    Subjective: Seen and examined this morning.  Reports left leg feels better.  No new complaints.  INR remains subtherapeutic on heparin  drip. Assessment and Plan: * Left leg cellulitis- (present on admission) Improved -complete Keflex for 7 days continue wound care seen by vascular ABI normal advised Unna boot/compression dressing and outpatient follow-up.   DVT (deep venous thrombosis) ([place])- (present on admission) DVT Lt LE w/ more proximal propagation this admission Hx of PE: Seen by vascular surgery and also discussed with hematology oncology (Dr Othella Boyer Eliquis failure-and now transitioned to Coumadin with heparin bridge.  Pharmacy dosing Coumadin and heparin until INR therapeutic.  Patient has follow-up arranged with hematology Dr Irene Limbo upon discharge. Will need home health/Coumadin clinic or PCP follow-up to adjust to monitor Coumadin Recent Labs  Lab 12/11/21 0522 12/12/21 0750 12/13/21 0558  INR 1.6* 1.7* 1.7*    SVT (supraventricular tachycardia) (HCC) Episode of SVT in the 160s to 170s on 12/10/21-resolved w/ IV Lopressor.  Likely from not getting metoprolol- now back on home metoprolol.stable  Obesity, Class I, BMI 30-34.9 Will benefit with PCP follow-up, weight loss  healthy lifestyle and outpatient sleep evaluation.  Hypothyroidism- (present on admission) cont sythroid  Physical deconditioning- (present on admission) Continue with PT OT.Refusing placement/HH.  Chronic pain syndrome- (present on admission) Continue current oxycodone pain management.  Minimize IV narcotics  CKD (chronic kidney disease), stage IV ([place])- (present on admission) AKI on CKD stage IV versus progression of CKD stage IV: baseline creatinine on last discharge 2.7 on 1/2, was 3.5 prior to that. Currently at 2.6, stable. Avoid nephrotoxic medication.  Monitor Recent Labs  Lab 12/07/21 1445 12/08/21 0302 12/09/21 0216 12/10/21 1603  BUN 69* 64* 49* 35*  CREATININE 3.68* 3.64* 3.02* 2.63*     BPH (benign prostatic hyperplasia)- (present on admission) Continue Flomax Proscar  GERD (gastroesophageal reflux disease)-  (present on admission) Continue PPI  Chronic combined systolic and diastolic CHF (congestive heart failure) ([place])- (present on admission) Not in failure currently. Resume home diuretics Net IO Since Admission: -2,079.32 mL [12/13/21 0753]  Filed Weights   12/07/21 1446 12/07/21 2120 12/08/21 0438  Weight: 126.8 kg 121.7 kg 121.7 kg    HTN (hypertension)- (present on admission) Fairly stable on multiple regimen including Clonidine amlodipine, metoprolol   Gout- (present on admission) cont home colchicine and allopurinol.  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 :  DVT prophylaxis: HEPARIN.COUMADIN Code Status:   Code Status: Full Code Family Communication: plan of care discussed with patient at bedside.  Disposition: Currently NOT medically stable for discharge. Status is: Inpatient Remains inpatient appropriate because: For management of DVT with heparin and transition to Coumadin. WILL NEED 2-3 DAYS  Objective: Vitals last 24 hrs: Vitals:   12/12/21 0408 12/12/21 1356 12/12/21 2024 12/13/21 0451  BP: (!) 151/103 (!) 153/95 130/74 (!) 163/93  Pulse: 78 69 77 63  Resp: 20 19 16 17   Temp: 98.1 F (36.7 C) 98.6 F (37 C) 98.5 F (36.9 C) 98.4 F (36.9 C)  TempSrc: Oral Oral Oral Oral  SpO2: 95% 98% 93% 99%  Weight:      Height:       Weight change:   Physical Examination: General exam: AA OX3,older than stated age, weak appearing. HEENT:Oral mucosa moist, Ear/Nose WNL grossly, dentition normal. Respiratory system: bilaterally diminished,no use of accessory muscle Cardiovascular system: S1 & S2 +, No JVD,. Gastrointestinal system: Abdomen soft,NT,ND, BS+ Nervous System:Alert, awake, moving  extremities and grossly nonfocal Extremities: RT BKA, LLE WITH dressing in place, skin excoriation and dryness  Skin: No rashes,no icterus. MSK: Normal muscle bulk,tone, power   Medications reviewed:  Scheduled Meds:  allopurinol  100 mg Oral Daily   amLODipine  5 mg Oral Daily   bisacodyl  10 mg Rectal QHS   cephALEXin  500 mg Oral Q6H   cloNIDine  0.1 mg Oral TID   colchicine  0.6 mg Oral Daily   ferrous sulfate  325 mg Oral Q breakfast   finasteride  5 mg Oral Daily   furosemide  40 mg Oral Daily   gabapentin  300 mg Oral TID   insulin aspart  0-6 Units Subcutaneous TID WC   levothyroxine  50 mcg Oral QAC breakfast   metoprolol tartrate  50 mg Oral Daily   oxyCODONE  15 mg Oral Q12H   polyethylene glycol  17 g Oral Daily   senna-docusate  1 tablet Oral BID   tamsulosin  0.4 mg Oral Daily   warfarin  7.5 mg Oral ONCE-1600   Warfarin - Pharmacist Dosing Inpatient   Does not apply q1600   Continuous Infusions:  heparin 2,150 Units/hr (12/13/21 0600)   Diet Order             Diet regular Room service appropriate? Yes; Fluid consistency: Thin  Diet effective now                  Intake/Output Summary (Last 24 hours) at 12/13/2021 1016 Last data filed at 12/13/2021 0600 Gross per 24 hour  Intake 1318.64 ml  Output 1500 ml  Net -181.36 ml  Net IO Since Admission: -2,079.32 mL [12/13/21 1016]  Wt Readings from Last 3 Encounters:  12/08/21 121.7 kg  10/20/21 126.8 kg  10/07/21 124.1 kg     Unresulted Labs (From admission, onward)     Start     Ordered   12/14/21 0500  Heparin level (unfractionated)  Daily,   R     Question:  Specimen collection method  Answer:  Lab=Lab collect   12/12/21 2116   12/12/21 0500  CBC  Daily,   R      12/11/21 1101   12/11/21 0500  Protime-INR  Daily,   R      12/10/21 0951          Data Reviewed: I have personally reviewed following labs and imaging studies CBC: Recent Labs  Lab 12/07/21 1445 12/08/21 0302  12/09/21 0216 12/10/21 0522 12/12/21 0750 12/13/21 0558  WBC 5.7 5.3 5.4 5.7 5.1 5.1  NEUTROABS 3.5  --   --   --   --   --   HGB 8.9* 8.8* 9.4* 10.5* 9.9* 10.1*  HCT 28.3* 28.5* 29.7* 34.7* 31.5* 31.7*  MCV 102.5* 101.4* 101.0* 104.5* 100.3* 102.3*  PLT 173 172 175 178 180 295   Basic Metabolic Panel: Recent Labs  Lab 12/07/21 1445 12/08/21 0302 12/09/21 0216 12/10/21 1603  NA 137 138 138 136  K 5.0 4.8 4.9 4.5  CL 104 108 107 104  CO2 22 22 23 23   GLUCOSE 138* 69* 98 115*  BUN 69* 64* 49* 35*  CREATININE 3.68* 3.64* 3.02* 2.63*  CALCIUM 7.4* 7.3* 7.5* 8.2*  MG  --   --   --  1.8   GFR: Estimated Creatinine Clearance: 39.4 mL/min (A) (by C-G formula based on SCr of 2.63 mg/dL (H)). Liver Function Tests: Recent Labs  Lab 12/08/21 0302  AST 19  ALT 11  ALKPHOS 89  BILITOT 0.4  PROT 7.6  ALBUMIN 3.1*   No results for input(s): LIPASE, AMYLASE in the last 168 hours. No results for input(s): AMMONIA in the last 168 hours. Coagulation Profile: Recent Labs  Lab 12/11/21 0522 12/12/21 0750 12/13/21 0558  INR 1.6* 1.7* 1.7*   Cardiac Enzymes: Recent Labs  Lab 12/07/21 1445  CKTOTAL 180   BNP (last 3 results) No results for input(s): PROBNP in the last 8760 hours. HbA1C: No results for input(s): HGBA1C in the last 72 hours. CBG: Recent Labs  Lab 12/12/21 0731 12/12/21 1215 12/12/21 1648 12/12/21 2025 12/13/21 0759  GLUCAP 121* 117* 146* 190* 135*   Lipid Profile: No results for input(s): CHOL, HDL, LDLCALC, TRIG, CHOLHDL, LDLDIRECT in the last 72 hours. Thyroid Function Tests: No results for input(s): TSH, T4TOTAL, FREET4, T3FREE, THYROIDAB in the last 72 hours. Anemia Panel: No results for input(s): VITAMINB12, FOLATE, FERRITIN, TIBC, IRON, RETICCTPCT in the last 72 hours. Sepsis Labs: No results for input(s): PROCALCITON, LATICACIDVEN in the last 168 hours.  Recent Results (from the past 240 hour(s))  Resp Panel by RT-PCR (Flu A&B, Covid)  Nasopharyngeal Swab     Status: None   Collection Time: 12/07/21  3:20 PM   Specimen: Nasopharyngeal Swab; Nasopharyngeal(NP) swabs in vial transport medium  Result Value Ref Range Status   SARS Coronavirus 2 by RT PCR NEGATIVE NEGATIVE Final    Comment: (NOTE) SARS-CoV-2 target nucleic acids are NOT DETECTED.  The SARS-CoV-2 RNA is generally detectable in upper respiratory specimens during the acute phase of infection. The lowest concentration of SARS-CoV-2 viral copies this assay can  detect is 138 copies/mL. A negative result does not preclude SARS-Cov-2 infection and should not be used as the sole basis for treatment or other patient management decisions. A negative result may occur with  improper specimen collection/handling, submission of specimen other than nasopharyngeal swab, presence of viral mutation(s) within the areas targeted by this assay, and inadequate number of viral copies(<138 copies/mL). A negative result must be combined with clinical observations, patient history, and epidemiological information. The expected result is Negative.  Fact Sheet for Patients:  EntrepreneurPulse.com.au  Fact Sheet for Healthcare Providers:  IncredibleEmployment.be  This test is no t yet approved or cleared by the [place] FDA and  has been authorized for detection and/or diagnosis of SARS-CoV-2 by FDA under an Emergency Use Authorization (EUA). This EUA will remain  in effect (meaning this test can be used) for the duration of the COVID-19 declaration under Section 564(b)(1) of the Act, 21 U.S.C.section 360bbb-3(b)(1), unless the authorization is terminated  or revoked sooner.       Influenza A by PCR NEGATIVE NEGATIVE Final   Influenza B by PCR NEGATIVE NEGATIVE Final    Comment: (NOTE) The Xpert Xpress SARS-CoV-2/FLU/RSV plus assay is intended as an aid in the diagnosis of influenza from Nasopharyngeal swab specimens and should not be  used as a sole basis for treatment. Nasal washings and aspirates are unacceptable for Xpert Xpress SARS-CoV-2/FLU/RSV testing.  Fact Sheet for Patients: EntrepreneurPulse.com.au  Fact Sheet for Healthcare Providers: IncredibleEmployment.be  This test is not yet approved or cleared by the [place] FDA and has been authorized for detection and/or diagnosis of SARS-CoV-2 by FDA under an Emergency Use Authorization (EUA). This EUA will remain in effect (meaning this test can be used) for the duration of the COVID-19 declaration under Section 564(b)(1) of the Act, 21 U.S.C. section 360bbb-3(b)(1), unless the authorization is terminated or revoked.  Performed at [facility], [place] [address], [place], [place] [address]     Antimicrobials: Anti-infectives (From admission, onward)    Start     Dose/Rate Route Frequency Ordered Stop   12/10/21 1800  cephALEXin (KEFLEX) capsule 500 mg        500 mg Oral Every 6 hours 12/10/21 1256 12/14/21 1759   12/09/21 1400  ceFAZolin (ANCEF) IVPB 2g/100 mL premix  Status:  Discontinued        2 g 200 mL/hr over 30 Minutes Intravenous Every 8 hours 12/09/21 0706 12/10/21 1256   12/07/21 1800  ceFAZolin (ANCEF) IVPB 2g/100 mL premix  Status:  Discontinued        2 g 200 mL/hr over 30 Minutes Intravenous Every 12 hours 12/07/21 1654 12/09/21 0706   12/07/21 1630  ceFAZolin (ANCEF) IVPB 2g/100 mL premix  Status:  Discontinued        2 g 200 mL/hr over 30 Minutes Intravenous  Once 12/07/21 1627 12/07/21 1708      Culture/Microbiology    Component Value Date/Time   SDES  10/18/2021 0037    RIGHT ANTECUBITAL Performed at [facility], [place] [address] [place], [place] [address]    SDES  10/18/2021 0037    LEFT ANTECUBITAL Performed at [facility], [place] [address], [place],  [address]    [place]  10/18/2021 0037    BOTTLES DRAWN AEROBIC AND  ANAEROBIC Blood Culture results may not be optimal due to an excessive volume of blood received in culture bottles Performed at [facility], [place] [address] [place], [place]  [place]  10/18/2021 0037    BOTTLES DRAWN AEROBIC AND ANAEROBIC Blood Culture adequate volume Performed at [place] [address] [place], [place] [address]    CULT  10/18/2021 0037    NO GROWTH 5 DAYS Performed at [facility] [address], [place], [place] [address]    CULT  10/18/2021 0037    NO GROWTH 5 DAYS Performed at [facility], [place] [address] [place], [place] [address]    REPTSTATUS 10/23/2021 FINAL 10/18/2021 0037   REPTSTATUS 10/23/2021 FINAL 10/18/2021 0037  Radiology Studies: No results found.   LOS: 6 days   Antonieta Pert, MD [facility]  12/13/2021, 10:16 AM

## 2021-12-14 ENCOUNTER — Inpatient Hospital Stay (HOSPITAL_COMMUNITY): Payer: Medicare Other

## 2021-12-14 DIAGNOSIS — L03116 Cellulitis of left lower limb: Secondary | ICD-10-CM | POA: Diagnosis not present

## 2021-12-14 DIAGNOSIS — Z86718 Personal history of other venous thrombosis and embolism: Secondary | ICD-10-CM | POA: Diagnosis not present

## 2021-12-14 LAB — CBC
HCT: 31.3 % — ABNORMAL LOW (ref 39.0–52.0)
Hemoglobin: 9.8 g/dL — ABNORMAL LOW (ref 13.0–17.0)
MCH: 32.2 pg (ref 26.0–34.0)
MCHC: 31.3 g/dL (ref 30.0–36.0)
MCV: 103 fL — ABNORMAL HIGH (ref 80.0–100.0)
Platelets: 188 10*3/uL (ref 150–400)
RBC: 3.04 MIL/uL — ABNORMAL LOW (ref 4.22–5.81)
RDW: 16 % — ABNORMAL HIGH (ref 11.5–15.5)
WBC: 5.4 10*3/uL (ref 4.0–10.5)
nRBC: 0 % (ref 0.0–0.2)

## 2021-12-14 LAB — HEPARIN LEVEL (UNFRACTIONATED): Heparin Unfractionated: 0.57 IU/mL (ref 0.30–0.70)

## 2021-12-14 LAB — PROTIME-INR
INR: 1.9 — ABNORMAL HIGH (ref 0.8–1.2)
Prothrombin Time: 21.3 seconds — ABNORMAL HIGH (ref 11.4–15.2)

## 2021-12-14 LAB — GLUCOSE, CAPILLARY
Glucose-Capillary: 112 mg/dL — ABNORMAL HIGH (ref 70–99)
Glucose-Capillary: 111 mg/dL — ABNORMAL HIGH (ref 70–99)
Glucose-Capillary: 128 mg/dL — ABNORMAL HIGH (ref 70–99)
Glucose-Capillary: 189 mg/dL — ABNORMAL HIGH (ref 70–99)

## 2021-12-14 MED ORDER — WARFARIN SODIUM 2.5 MG PO TABS: 7.5000 mg | ORAL_TABLET | Freq: Once | ORAL | 0 refills | Status: DC

## 2021-12-14 MED ORDER — WARFARIN SODIUM 5 MG PO TABS
7.5000 mg | ORAL_TABLET | Freq: Once | ORAL | Status: AC
  Administered 2021-12-14: 7.5 mg via ORAL
  Filled 2021-12-14: qty 1

## 2021-12-14 MED ORDER — BISACODYL 10 MG RE SUPP
10.0000 mg | Freq: Every day | RECTAL | Status: DC | PRN
  Administered 2021-12-14: 10 mg via RECTAL
  Filled 2021-12-14: qty 1

## 2021-12-14 MED ORDER — SENNOSIDES-DOCUSATE SODIUM 8.6-50 MG PO TABS: 1.0000 | ORAL_TABLET | Freq: Two times a day (BID) | ORAL | 0 refills | Status: DC

## 2021-12-14 NOTE — Progress Notes (Signed)
Physical Therapy Treatment Patient Details Name: Thomas [name] MRN: [id number] DOB: 12-14-1950 Today's Date: 12/14/2021   History of Present Illness 71 yo male admitted with L LE cellulitis, worsening L LE DVT. Hx of chronic pain, back surgeries, DVT, PE, DM, CKD, R BKA, HF, gout    PT Comments    Pt making gradual progress but is self limiting.  States "I need to walk to make sure I can go home, but let's try it later/tomorrow."  Tried to encourage but pt prefers to wait.  Spent increased time at EOB and on exercises - pt required cues and redirecting.  Also, encouraged to have family bring L shoe for matching height to prosthetic for gait training.    Recommendations for follow up therapy are one component of a multi-disciplinary discharge planning process, led by the attending physician.  Recommendations may be updated based on patient status, additional functional criteria and insurance authorization.  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  States "I need to make sure I can walk before I go home, w/c won't fit in house, but let's do it tomorrow."  Tried to encourage but pt declined requesting later b/c his pain meds are working and doesn't want to negate that.  Reports he did lateral scoot to chair earlier.    Ambulation/Gait                   Stairs             Wheelchair Mobility    Modified Rankin (Stroke Patients Only)       Balance Overall balance assessment: Needs assistance Sitting-balance support: Feet supported, No upper extremity supported Sitting balance-[name]: Good Sitting balance - Comments: EOB for 20 mins                                    Cognition Arousal/Alertness: Awake/alert Behavior During Therapy: WFL for tasks assessed/performed Overall Cognitive Status: Within Functional Limits for tasks assessed                                          Exercises General Exercises - Lower Extremity Ankle Circles/Pumps: AROM, Left, Seated (10x2) Long Arc Quad: AROM, Both, Seated (10x2) Hip Flexion/Marching: AROM, Both, Seated (10x2)    General Comments  Pertinent Vitals/Pain Pain Assessment Pain Assessment: Faces Faces Pain Scale: Hurts little more Pain Location: Back and L LE Pain Descriptors / Indicators: Discomfort Pain Intervention(s): Limited activity within patient's tolerance, Monitored during session, Premedicated before session    Home Living                          Prior Function            PT Goals (current goals can now be found in the care plan section) Progress towards PT goals: Progressing toward goals    Frequency    Min 3X/week      PT Plan Current plan remains appropriate    Co-evaluation              AM-PAC PT "6 Clicks" Mobility   Outcome Measure  Help needed turning from your back to your side while in a flat bed without using  bedrails?: None Help needed moving from lying on your back to sitting on the side of a flat bed without using bedrails?: None Help needed moving to and from a bed to a chair (including a wheelchair)?: A Little Help needed standing up from a chair using your arms (e.g., wheelchair or bedside chair)?: A Little Help needed to walk in hospital room?: A Little Help needed climbing 3-5 steps with a railing? : A Lot 6 Click Score: 19    End of Session   Activity Tolerance: Other (comment) (self limiting) Patient left: in bed;with call bell/phone within reach;with bed alarm set Nurse Communication: Mobility status PT Visit Diagnosis: Muscle weakness (generalized) (M62.81);Difficulty in walking, not elsewhere classified (R26.2)     Time: 1531-1601 PT Time Calculation (min) (ACUTE ONLY): 30 min  Charges:  $Therapeutic Exercise: 8-22 mins $Therapeutic Activity: 8-22 mins                     Abran Richard, PT Acute Rehab Services Pager [phone] [facility] [phone]    [name] 12/14/2021, 4:44 PM

## 2021-12-14 NOTE — Progress Notes (Signed)
LLE venous duplex has been completed.  Results can be found under chart review under CV PROC. 12/14/2021 5:46 PM Quy Lotts RVT, RDMS

## 2021-12-14 NOTE — Progress Notes (Signed)
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  Found to have cellulitis w/ edematous and warm appearing left lower extremity started on antibiotics, heparin drip and admission requested for further management. Seen by vascular felt this as an anticoagulation failure, recommend Unna boot and compression stockings with wound care, patient removed Unna boot after placement as unable to tolerate. Repeat ABI within normal limits. PTOT has seen and advised home health PT.  Which he has refused patient also reports placement.  Discussed with Dr. Irene Limbo from hematology about overall condition he advised to switch to Coumadin with heparin bridge given his anticoagulation failure, renal dysfunction. INR slowly coming up 1.7/24-once INR close to 2 we will discharge him on Coumadin.  At this time INR is trending up nicely almost therapeutic at 1.9.     Subjective: Inr close to 2. C/o lots of pain on his left leg. Reluctant to go home not abel to walk- lives alone. Repeat US ordered.  Assessment and Plan: * Left leg cellulitis- (present on admission) Improved -complete Keflexx7 days  And continue wound car. Seen by vascular ABI normal advised Unna boot/compression dressing  DVT (deep venous thrombosis) ([place])- (present on admission) DVT Lt LE w/ more proximal propagation this admission Hx of PE: Seen by vascular surgery and also discussed with hematology oncology (Dr Edd Fabian to be Eliquis failure-and transitioned to Coumadin w/ heparin bridge.  Pharmacy dosing Coumadin and heparin-INR is almost therapeutic 1.9> but c/o 10/10 pain on left leg, not able to walk eh says- repeat Duplex left leg ordered.Patient has follow-up arranged with hematology Dr Irene Limbo upon discharge. Will need home health/Coumadin clinic or PCP follow-up to adjust to monitor Coumadin Recent Labs  Lab 12/11/21 0522 12/12/21 0750 12/13/21 0558 12/14/21 0828  INR 1.6* 1.7* 1.7* 1.9*    SVT (supraventricular tachycardia) (HCC) Episode of SVT in the 160s to 170s on 12/10/21-resolved w/ IV Lopressor.  Likely from not getting metoprolol- now back on home metoprolol.stable  Obesity, Class I, BMI 30-34.9 Will benefit with PCP follow-up, weight loss  healthy lifestyle and outpatient sleep evaluation.  Hypothyroidism- (present on admission) cont sythroid  Physical deconditioning- (present on admission) Continue with PT OT.Refusing placement/HH.  Chronic pain syndrome- (present on admission) Continue current oxycodone pain management.  Minimize IV narcotics  CKD (chronic kidney disease), stage IV ([place])- (present on admission) AKI on CKD stage IV versus progression of CKD stage IV: baseline creatinine on last discharge 2.7 on 1/2, was 3.5 prior to that. Currently at 2.6, stable. Avoid nephrotoxic medication.  Monitor Recent  Labs  Lab 12/07/21 1445 12/08/21 0302  12/09/21 0216 12/10/21 1603  BUN 69* 64* 49* 35*  CREATININE 3.68* 3.64* 3.02* 2.63*     BPH (benign prostatic hyperplasia)- (present on admission) Continue Flomax Proscar  GERD (gastroesophageal reflux disease)- (present on admission) Continue PPI  Chronic combined systolic and diastolic CHF (congestive heart failure) ([place])- (present on admission) Not in failure currently. Resume home diuretics Net IO Since Admission: -3,588.32 mL [12/14/21 0749]  Filed Weights   12/07/21 1446 12/07/21 2120 12/08/21 0438  Weight: 126.8 kg 121.7 kg 121.7 kg    HTN (hypertension)- (present on admission) Fairly stable on multiple regimen including Clonidine amlodipine, metoprolol   Gout- (present on admission) cont home colchicine and allopurinol.  [name], type 2) ([place]) Blood sugar well controlled. Holding metformin and home glipizide due to hypoglycemia and CKD status. Not needed insulin. 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 :  DVT prophylaxis: HEPARIN.COUMADIN Code Status:   Code Status: Full Code Family Communication: plan of care discussed with patient at bedside.  Disposition: Currently NOT medically stable for discharge. Status is: Inpatient Remains inpatient appropriate because: For management of DVT with heparin and transition to Coumadin. MOBILIZE, ANTICIPATE D/C IN AM 2/2 LLE pain.  Objective: Vitals last 24 hrs: Vitals:   12/13/21 1409 12/13/21 1953 12/14/21 0431 12/14/21 1330  BP: (!) 154/90 (!) 157/84 (!) 166/97 (!) 154/92  Pulse: 66 68 63 74  Resp: 16 17 18 17   Temp: 98.9 F (37.2 C) 98.5 F (36.9 C) 98.2 F (36.8 C) (!) 97.5 F (36.4 C)  TempSrc: Oral Oral Oral Oral  SpO2: 95% 92% 99% 97%  Weight:       Height:       Weight change:   Physical Examination: General exam: AA0x3, older than stated age, weak appearing. HEENT:Oral mucosa moist, Ear/Nose WNL grossly, dentition normal. Respiratory system: bilaterally diminished, no use of accessory muscle Cardiovascular system: S1 & S2 +, No JVD,. Gastrointestinal system: Abdomen soft,NT,ND,BS+ Nervous System:Alert, awake, moving extremities and grossly nonfocal Extremities: Lle chronic appearing skin with wound in dressing- not red, mildly tender, rt BKA Skin: No rashes,no icterus. MSK: Normal muscle bulk,tone, power    Medications reviewed:  Scheduled Meds:  allopurinol  100 mg Oral Daily   amLODipine  5 mg Oral Daily   cloNIDine  0.1 mg Oral TID   colchicine  0.6 mg Oral Daily   ferrous sulfate  325 mg Oral Q breakfast   finasteride  5 mg Oral Daily   furosemide  40 mg Oral Daily   gabapentin  300 mg Oral TID   insulin aspart  0-6 Units Subcutaneous TID WC   levothyroxine  50 mcg Oral QAC breakfast   metoprolol tartrate  50 mg Oral Daily   oxyCODONE  15 mg Oral Q12H   senna-docusate  1 tablet Oral BID   tamsulosin  0.4 mg Oral Daily   Warfarin - Pharmacist Dosing Inpatient   Does not apply q1600   Continuous Infusions:  heparin 2,150 Units/hr (12/14/21 1627)   Diet Order             Diet regular Room service appropriate? Yes; Fluid consistency: Thin  Diet effective now  Intake/Output Summary (Last 24 hours) at 12/14/2021 1915 Last data filed at 12/14/2021 1758 Gross per 24 hour  Intake 468.19 ml  Output 2750 ml  Net -2281.81 ml   Net IO Since Admission: -4,138.32 mL [12/14/21 1915]  Wt Readings from Last 3 Encounters:  12/08/21 121.7 kg  10/20/21 126.8 kg  10/07/21 124.1 kg     Unresulted Labs (From admission, onward)     Start     Ordered   12/14/21 0500  Heparin level (unfractionated)  Daily,   R     Question:  Specimen collection method  Answer:  Lab=Lab collect   12/12/21 2116    12/12/21 0500  CBC  Daily,   R      12/11/21 1101   12/11/21 0500  Protime-INR  Daily,   R      12/10/21 0951          Data Reviewed: I have personally reviewed following labs and imaging studies CBC: Recent Labs  Lab 12/09/21 0216 12/10/21 0522 12/12/21 0750 12/13/21 0558 12/14/21 0828  WBC 5.4 5.7 5.1 5.1 5.4  HGB 9.4* 10.5* 9.9* 10.1* 9.8*  HCT 29.7* 34.7* 31.5* 31.7* 31.3*  MCV 101.0* 104.5* 100.3* 102.3* 103.0*  PLT 175 178 180 194 322   Basic Metabolic Panel: Recent Labs  Lab 12/08/21 0302 12/09/21 0216 12/10/21 1603  NA 138 138 136  K 4.8 4.9 4.5  CL 108 107 104  CO2 22 23 23   GLUCOSE 69* 98 115*  BUN 64* 49* 35*  CREATININE 3.64* 3.02* 2.63*  CALCIUM 7.3* 7.5* 8.2*  MG  --   --  1.8   GFR: Estimated Creatinine Clearance: 39.4 mL/min (A) (by C-G formula based on SCr of 2.63 mg/dL (H)). Liver Function Tests: Recent Labs  Lab 12/08/21 0302  AST 19  ALT 11  ALKPHOS 89  BILITOT 0.4  PROT 7.6  ALBUMIN 3.1*   No results for input(s): LIPASE, AMYLASE in the last 168 hours. No results for input(s): AMMONIA in the last 168 hours. Coagulation Profile: Recent Labs  Lab 12/11/21 0522 12/12/21 0750 12/13/21 0558 12/14/21 0828  INR 1.6* 1.7* 1.7* 1.9*   Cardiac Enzymes: No results for input(s): CKTOTAL, CKMB, CKMBINDEX, TROPONINI in the last 168 hours.  BNP (last 3 results) No results for input(s): PROBNP in the last 8760 hours. HbA1C: No results for input(s): HGBA1C in the last 72 hours. CBG: Recent Labs  Lab 12/13/21 1717 12/13/21 2154 12/14/21 0743 12/14/21 1136 12/14/21 1629  GLUCAP 133* 182* 112* 111* 128*   Lipid Profile: No results for input(s): CHOL, HDL, LDLCALC, TRIG, CHOLHDL, LDLDIRECT in the last 72 hours. Thyroid Function Tests: No results for input(s): TSH, T4TOTAL, FREET4, T3FREE, THYROIDAB in the last 72 hours. Anemia Panel: No results for input(s): VITAMINB12, FOLATE, FERRITIN, TIBC, IRON, RETICCTPCT in the last 72  hours. Sepsis Labs: No results for input(s): PROCALCITON, LATICACIDVEN in the last 168 hours.  Recent Results (from the past 240 hour(s))  Resp Panel by RT-PCR (Flu A&B, Covid) Nasopharyngeal Swab     Status: None   Collection Time: 12/07/21  3:20 PM   Specimen: Nasopharyngeal Swab; Nasopharyngeal(NP) swabs in vial transport medium  Result Value Ref Range Status   SARS Coronavirus 2 by RT PCR NEGATIVE NEGATIVE Final    Comment: (NOTE) SARS-CoV-2 target nucleic acids are NOT DETECTED.  The SARS-CoV-2 RNA is generally detectable in upper respiratory specimens during the acute phase of infection. The lowest concentration of SARS-CoV-2 viral copies this assay can  detect is 138 copies/mL. A negative result does not preclude SARS-Cov-2 infection and should not be used as the sole basis for treatment or other patient management decisions. A negative result may occur with  improper specimen collection/handling, submission of specimen other than nasopharyngeal swab, presence of viral mutation(s) within the areas targeted by this assay, and inadequate number of viral copies(<138 copies/mL). A negative result must be combined with clinical observations, patient history, and epidemiological information. The expected result is Negative.  Fact Sheet for Patients:  EntrepreneurPulse.com.au  Fact Sheet for Healthcare Providers:  IncredibleEmployment.be  This test is no t yet approved or cleared by the [place] FDA and  has been authorized for detection and/or diagnosis of SARS-CoV-2 by FDA under an Emergency Use Authorization (EUA). This EUA will remain  in effect (meaning this test can be used) for the duration of the COVID-19 declaration under Section 564(b)(1) of the Act, 21 U.S.C.section 360bbb-3(b)(1), unless the authorization is terminated  or revoked sooner.       Influenza A by PCR NEGATIVE NEGATIVE Final   Influenza B by PCR NEGATIVE  NEGATIVE Final    Comment: (NOTE) The Xpert Xpress SARS-CoV-2/FLU/RSV plus assay is intended as an aid in the diagnosis of influenza from Nasopharyngeal swab specimens and should not be used as a sole basis for treatment. Nasal washings and aspirates are unacceptable for Xpert Xpress SARS-CoV-2/FLU/RSV testing.  Fact Sheet for Patients: EntrepreneurPulse.com.au  Fact Sheet for Healthcare Providers: IncredibleEmployment.be  This test is not yet approved or cleared by the [place] FDA and has been authorized for detection and/or diagnosis of SARS-CoV-2 by FDA under an Emergency Use Authorization (EUA). This EUA will remain in effect (meaning this test can be used) for the duration of the COVID-19 declaration under Section 564(b)(1) of the Act, 21 U.S.C. section 360bbb-3(b)(1), unless the authorization is terminated or revoked.  Performed at [facility], [place] [address], [place], [place] [address]     Antimicrobials: Anti-infectives (From admission, onward)    Start     Dose/Rate Route Frequency Ordered Stop   12/10/21 1800  cephALEXin (KEFLEX) capsule 500 mg        500 mg Oral Every 6 hours 12/10/21 1256 12/14/21 1123   12/09/21 1400  ceFAZolin (ANCEF) IVPB 2g/100 mL premix  Status:  Discontinued        2 g 200 mL/hr over 30 Minutes Intravenous Every 8 hours 12/09/21 0706 12/10/21 1256   12/07/21 1800  ceFAZolin (ANCEF) IVPB 2g/100 mL premix  Status:  Discontinued        2 g 200 mL/hr over 30 Minutes Intravenous Every 12 hours 12/07/21 1654 12/09/21 0706   12/07/21 1630  ceFAZolin (ANCEF) IVPB 2g/100 mL premix  Status:  Discontinued        2 g 200 mL/hr over 30 Minutes Intravenous  Once 12/07/21 1627 12/07/21 1708      Culture/Microbiology    Component Value Date/Time   SDES  10/18/2021 0037    RIGHT ANTECUBITAL Performed at [facility], [place] [address] [place], [place] [address]    SDES   10/18/2021 0037    LEFT ANTECUBITAL Performed at [facility], [place] [address] [place], [place] [address]    [place]  10/18/2021 0037    BOTTLES DRAWN AEROBIC AND ANAEROBIC Blood Culture results may not be optimal due to an excessive volume of blood received in culture bottles Performed at [facility], [place] [address] [place], [place]  [place]  10/18/2021 0037    BOTTLES DRAWN AEROBIC AND ANAEROBIC Blood Culture adequate volume Performed at [place] [address], [place], [place] [address]    CULT  10/18/2021 0037    NO GROWTH 5 DAYS Performed at [place] [address] [place], [place] [address]    CULT  10/18/2021 0037    NO GROWTH 5 DAYS Performed at [facility], [place] [address], [place], [place] [address]    REPTSTATUS 10/23/2021 FINAL 10/18/2021 0037   REPTSTATUS 10/23/2021 FINAL 10/18/2021 0037  Radiology Studies: VAS [place] LOWER EXTREMITY VENOUS (DVT)  Result Date: 12/14/2021  Lower Venous DVT Study Patient Name:  Thomas [name]  Date of Exam:   12/14/2021 Medical Rec #: [id number]        Accession #:    [id number] Date of Birth: [birth year]        Patient Gender: M Patient Age:   70 years Exam Location:  [facility] Procedure:      VAS [place] LOWER EXTREMITY VENOUS (DVT) Referring Phys: [place] --------------------------------------------------------------------------------  Indications: Follow up exam - previous DVT.  Risk Factors: HX DVT & PE. Anticoagulation: Eliquis prior to admission. Limitations: Body habitus and poor ultrasound/tissue interface. Comparison Study: Previous LLEV exam on 12/07/2021 positive for DVT (chronic &                   age indeterminate FV) Performing Technologist: Jody Hill RVT, RDMS  Examination Guidelines: A complete evaluation includes B-mode imaging, spectral Doppler, color Doppler, and power Doppler as needed of all accessible portions of each  vessel. Bilateral testing is considered an integral part of a complete examination. Limited examinations for reoccurring indications may be performed as noted. The reflux portion of the exam is performed with the patient in reverse Trendelenburg.  +-----+---------------+---------+-----------+----------+--------------+  RIGHT Compressibility Phasicity Spontaneity Properties Thrombus Aging  +-----+---------------+---------+-----------+----------+--------------+  CFV   Full            Yes       Yes                                    +-----+---------------+---------+-----------+----------+--------------+   +---------+---------------+---------+-----------+----------+-------------------+  LEFT      Compressibility Phasicity Spontaneity Properties Thrombus Aging       +---------+---------------+---------+-----------+----------+-------------------+  CFV       Full            Yes       Yes                                         +---------+---------------+---------+-----------+----------+-------------------+  SFJ       Full                                                                  +---------+---------------+---------+-----------+----------+-------------------+  FV Prox   Partial         Yes       Yes  Chronic              +---------+---------------+---------+-----------+----------+-------------------+  FV Mid    Partial         Yes       Yes                    Chronic              +---------+---------------+---------+-----------+----------+-------------------+  FV Distal None            No        No                     Chronic              +---------+---------------+---------+-----------+----------+-------------------+  PFV       Full                                                                  +---------+---------------+---------+-----------+----------+-------------------+  POP       Partial         Yes       Yes                    Chronic               +---------+---------------+---------+-----------+----------+-------------------+  PTV                                                        Not well visualized  +---------+---------------+---------+-----------+----------+-------------------+  PERO                                                       Not well visualized  +---------+---------------+---------+-----------+----------+-------------------+     Summary: RIGHT: - No evidence of common femoral vein obstruction.  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 - No cystic structure found in the popliteal fossa. - Ultrasound characteristics of enlarged lymph nodes noted in the groin.  *See table(s) above for measurements and observations.    Preliminary      LOS: 7 days   Antonieta Pert, MD [facility]  12/14/2021, 7:15 PM

## 2021-12-14 NOTE — Discharge Summary (Addendum)
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  Found to have cellulitis w/ edematous and warm appearing left lower extremity started on antibiotics, heparin drip and admission requested for further management. Seen by vascular felt this as an anticoagulation failure, recommend Unna boot and compression stockings with wound care, patient removed Unna boot after placement as unable to tolerate. Repeat ABI within normal limits. PTOT has seen and advised home health PT.  Which he has refused patient also reports placement.  Discussed with Dr. Irene Limbo from hematology about overall condition he advised to switch to Coumadin with heparin bridge  given his anticoagulation failure, renal dysfunction. INR slowly coming up 1.7/24-once INR close to 2 we will discharge him on Coumadin.  At this time INR is trending up and therapeutic at 2.1 on 2/26.  Patient wanted to make sure he is able to mobilize prior to discharge as he lives alone.  At this time is medically stable for discharge home-with outpatient follow-up PT/INR monitoring Discussed with pharmacy we will keep on 7.5 mg Coumadin dose for home with INR monitoring home health and TOC notified  Discharge Diagnoses: Principal Problem:   Left leg cellulitis Active Problems:   DVT (deep venous thrombosis) (HCC)   SVT (supraventricular tachycardia) (HCC)   [name], right ([place])   [name], type 2) ([place])   Gout   HTN (hypertension)   Chronic combined systolic and diastolic CHF (congestive heart failure) (HCC)   GERD (gastroesophageal reflux disease)   BPH (benign prostatic hyperplasia)   CKD (chronic kidney disease), stage IV (HCC)   Chronic pain syndrome   Physical deconditioning   Hypothyroidism   Obesity, Class I, BMI 30-34.9 * Left leg cellulitis- (present on admission) Improved -completed Keflexx7 days  And continue wound car. Seen by vascular ABI normal advised Unna boot/compression dressing  DVT (deep venous thrombosis) ([place])- (present on admission) DVT Lt LE w/ more proximal propagation this admission Hx of PE: Seen by vascular surgery and also discussed with hematology oncology (Dr Edd Fabian to be Eliquis failure-and transitioned to Coumadin w/ heparin bridge.  Pharmacy dosing Coumadin and heparin-INR now therapeutic. patient was having 10/10 pain on left leg,  repeat Duplex left leg -obtain 2/25 shows chronic DVT of left femoral vein and left popliteal  vein.  We will continue on his chronic narcotics.  He is now stable for discharge on Coumadin, follow-up arranged with hematology Dr Irene Limbo upon discharge. Will need home health/Coumadin clinic or PCP  follow-up to adjust to monitor Coumadin Recent Labs  Lab 12/11/21 0522 12/12/21 0750 12/13/21 0558 12/14/21 0828 12/15/21 0559  INR 1.6* 1.7* 1.7* 1.9* 2.1*    SVT (supraventricular tachycardia) (HCC) Episode of SVT in the 160s to 170s on 12/10/21-resolved w/ IV Lopressor.  Likely from not getting metoprolol- now back on home metoprolol.stable  Obesity, Class I, BMI 30-34.9 Will benefit with PCP follow-up, weight loss  healthy lifestyle and outpatient sleep evaluation.  Hypothyroidism- (present on admission) cont sythroid  Physical deconditioning- (present on admission) Continue with PT OT.Refusing placement/HH.  Chronic pain syndrome- (present on admission) Continue current oxycodone pain management.  Minimize IV narcotics  CKD (chronic kidney disease), stage IV ([place])- (present on admission) AKI on CKD stage IV versus progression of CKD stage IV: baseline creatinine on last discharge 2.7 on 1/2, was 3.5 prior to that. Currently at 2.6, stable. Avoid nephrotoxic medication.  Monitor Recent Labs  Lab 12/09/21 0216 12/10/21 1603  BUN 49* 35*  CREATININE 3.02* 2.63*     BPH (benign prostatic hyperplasia)- (present on admission) Continue Flomax Proscar  GERD (gastroesophageal reflux disease)- (present on admission) Continue PPI  Chronic combined systolic and diastolic CHF (congestive heart failure) ([place])- (present on admission) Not in failure currently. Resume home diuretics Net IO Since Admission: -4,953.75 mL [12/15/21 0831]  Filed Weights   12/07/21 1446 12/07/21 2120 12/08/21 0438  Weight: 126.8 kg 121.7 kg 121.7 kg    HTN (hypertension)- (present on admission) Fairly stable on multiple regimen including Clonidine amlodipine, metoprolol   Gout- (present on admission) cont home colchicine and allopurinol.  [name], type 2) ([place]) Blood sugar well controlled. Holding metformin and home glipizide due to hypoglycemia and CKD status. Not needed  insulin. His hba1c was at 6.0 on 09/20/21, fu with pcp to discuss outpatient diabetic oral regimen Recent Labs  Lab 12/14/21 0743 12/14/21 1136 12/14/21 1629 12/14/21 2018 12/15/21 0757  GLUCAP 112* 111* 128* 189* 119*    [name], right ([place]) Uses prosthesis at home    Consultants: hem onc, vascular Procedures performed: none  Disposition: Home health Diet recommendation:  Diet Orders (From admission, onward)     Start     Ordered   12/11/21 1000  Diet regular Room service appropriate? Yes; Fluid consistency: Thin  Diet effective now       Question Answer Comment  Room service appropriate? Yes   Fluid consistency: Thin      12/11/21 0959             DISCHARGE MEDICATION: Allergies as of 12/15/2021       Reactions   Other Other (See Comments)   Blood pressure issues Per [facility] hospital: Pt states he can only take these pain meds or else he gets very sick: Oxycontin,morphine,demerol, and dilaudid are the only pain meds pt states he can take.    Oxymorphone Other (See Comments)   Causes kidney problems   Aspirin Nausea And Vomiting   Per [facility]   Beta Vulgaris Nausea And Vomiting   Beets   Buspirone Nausea And Vomiting, Other (See Comments)   Affected his head   Cabbage Nausea And Vomiting   Codeine Other (See Comments)   Unknown reaction   Dolobid [diflunisal] Other (See Comments)   Unknown reaction  Fish Allergy Nausea And Vomiting   Fish-derived Products Nausea And Vomiting   Methadone Other (See Comments)   Made him loopy   Pentazocine Other (See Comments)   Unknown reaction   Propoxyphene Nausea And Vomiting, Other (See Comments)   Messed with his head   Shellfish Allergy Nausea And Vomiting   Sulfa Antibiotics Hives   Sulfasalazine Other (See Comments)   Unknown reaction   Vistaril [hydroxyzine] Nausea And Vomiting, Other (See Comments)   Didn't help   Amoxicillin Rash   Per [facility]        Medication List      STOP taking these medications    glipiZIDE 10 MG 24 hr tablet Commonly known as: GLUCOTROL XL   metFORMIN 500 MG tablet Commonly known as: GLUCOPHAGE   metroNIDAZOLE 500 MG tablet Commonly known as: FLAGYL       TAKE these medications    acetaminophen 500 MG tablet Commonly known as: TYLENOL Take 500 mg by mouth every 6 (six) hours as needed for moderate pain.   allopurinol 100 MG tablet Commonly known as: ZYLOPRIM Take 100 mg by mouth daily.   amLODipine 5 MG tablet Commonly known as: NORVASC Take 5 mg by mouth daily.   atorvastatin 80 MG tablet Commonly known as: LIPITOR Take 1 tablet (80 mg total) by mouth daily.   bisacodyl 10 MG suppository Commonly known as: DULCOLAX Place 10 mg rectally at bedtime.   cloNIDine 0.1 MG tablet Commonly known as: CATAPRES Take 0.1 mg by mouth 3 (three) times daily.   colchicine 0.6 MG tablet Take 0.6 mg by mouth daily.   ferrous sulfate 325 (65 FE) MG tablet Take 1 tablet (325 mg total) by mouth daily with breakfast.   finasteride 5 MG tablet Commonly known as: PROSCAR Take 1 tablet (5 mg total) by mouth daily.   furosemide 40 MG tablet Commonly known as: LASIX Take 40 mg by mouth daily.   gabapentin 300 MG capsule Commonly known as: NEURONTIN Take 1 capsule (300 mg total) by mouth at bedtime. What changed: when to take this   hydrALAZINE 100 MG tablet Commonly known as: APRESOLINE Take 1 tablet by mouth every 8 hours. What changed:  when to take this reasons to take this   levothyroxine 50 MCG tablet Commonly known as: SYNTHROID Take 50 mcg by mouth daily before breakfast.   methocarbamol 750 MG tablet Commonly known as: ROBAXIN Take 1 tablet (750 mg total) by mouth every 8 (eight) hours as needed for muscle spasms.   metoprolol tartrate 100 MG tablet Commonly known as: LOPRESSOR Take 0.5 tablets (50 mg total) by mouth daily.   Movantik 25 MG Tabs tablet Generic drug: naloxegol oxalate Take 25 mg  by mouth daily.   OxyCONTIN 30 MG 12 hr tablet Generic drug: oxyCODONE Take 1 tablet (30 mg total) by mouth 2 (two) times daily. What changed:  when to take this reasons to take this   pantoprazole 40 MG tablet Commonly known as: PROTONIX Take 1 tablet by mouth 2 times daily.   polyethylene glycol 17 g packet Commonly known as: MiraLax Take 17 g by mouth daily as needed. What changed: reasons to take this   senna-docusate 8.6-50 MG tablet Commonly known as: Senokot-S Take 1 tablet by mouth 2 (two) times daily for 7 days.   tamsulosin 0.4 MG Caps capsule Commonly known as: FLOMAX Take 1 capsule (0.4 mg total) by mouth daily.   tiZANidine 4 MG tablet Commonly known as: ZANAFLEX Take  4 mg by mouth 3 (three) times daily.   warfarin 2.5 MG tablet Commonly known as: COUMADIN Take 3 tablets (7.5 mg total) by mouth one time only at 4 PM. INR check in 3 days and adjust coumadin dose for INR of 2-3               Discharge Care Instructions  (From admission, onward)           Start     Ordered   12/14/21 0000  Discharge wound care:       Comments: Clean LLE pretibial wound and L toe wound with saline, pat dry.  Cover both areas with single layer of xeroform gauze. Top with dry dressing. Wrap from toes to knee with kerlix and ACE wrap with slight compression. Change daily   12/14/21 1448            Follow-up Information     Thomas Jewel, MD Follow up.   Specialty: Internal Medicine Contact information: [address] [place] [address] [phone]         [facility], [facility] Follow up.   Specialty: Home Health Services Why: Will call you to set up a time to come out after you d/c from hospital Contact information: [place] [address] [phone]                 Discharge Exam: Filed Weights   12/07/21 1446 12/07/21 2120 12/08/21 0438  Weight: 126.8 kg 121.7 kg 121.7 kg  Condition at discharge:  fair  The results of significant diagnostics from this hospitalization (including imaging, microbiology, ancillary and laboratory) are listed below for reference.   Imaging Studies: DG Tibia/Fibula Left  Result Date: 12/07/2021 CLINICAL DATA:  Pain and swelling EXAM: LEFT TIBIA AND FIBULA - 2 VIEW COMPARISON:  None. FINDINGS: No recent fracture or dislocation is seen. There are no focal lytic lesions. There are no demonstrable pockets of air in the soft tissues. There is soft tissue swelling in the subcutaneous plane in the lower leg and around the left ankle. Deformity in the patella may be residual from previous injury. Degenerative changes are noted with bony spurs in the left knee, more severe in the patellofemoral compartment. IMPRESSION: No fracture or dislocation is seen. There are no opaque foreign bodies or abnormal pockets of air in the soft tissues. If there is continued clinical suspicion for osteomyelitis, follow-up three-phase bone scan or MRI may be considered. Electronically Signed   By: Thomas [name] M.D.   On: 12/07/2021 15:35   VAS [place] ABI WITH/WO TBI  Result Date: 12/09/2021  LOWER EXTREMITY DOPPLER STUDY Patient Name:  RAYVEN HENDRICKSON  Date of Exam:   12/09/2021 Medical Rec #: [id number]        Accession #:    [id number] Date of Birth: 30-Aug-1951        Patient Gender: M Patient Age:   80 years Exam Location:  [facility] Procedure:      VAS [place] ABI WITH/WO TBI Referring Phys: Soundra Pilon AMIN --------------------------------------------------------------------------------  Indications: Ulceration, and peripheral artery disease. High Risk Factors: Hypertension, Diabetes. Other Factors: 12/07/2021 - LEFT:                - Findings consistent with age indeterminate deep vein                thrombosis  involving the left proximal and mid femoral vein.                - Findings consistent with chronic deep vein thrombosis                involving the                 distal left femoral vein.                - No cystic structure found in the popliteal fossa.  Limitations: Today's exam was limited due to an open wound and bandages. Comparison Study: 11/30/2019 - Right:                   RT BKA.                    Left: Resting left ankle-brachial index indicates                   noncompressible left                   lower extremity arteries. Performing Technologist: Carlos Levering RVT  Examination Guidelines: A complete evaluation includes at minimum, Doppler waveform signals and systolic blood pressure reading at the level of bilateral brachial, anterior tibial, and posterior tibial arteries, when vessel segments are accessible. Bilateral testing is considered an integral part of a complete examination. Photoelectric Plethysmograph (PPG) waveforms and toe systolic pressure readings are included as required and additional duplex testing as needed. Limited examinations for reoccurring indications may be performed as noted.  ABI Findings: +---------+------------------+-----+---------+--------+  Right     Rt Pressure (mmHg) Index Waveform  Comment   +---------+------------------+-----+---------+--------+  Brachial  181                      triphasic           +---------+------------------+-----+---------+--------+  PTA                                          BKA       +---------+------------------+-----+---------+--------+  DP                                           BKA       +---------+------------------+-----+---------+--------+  Great Toe                                    BKA       +---------+------------------+-----+---------+--------+ +--------+------------------+-----+----------+-------+  Left     Lt Pressure (mmHg) Index Waveform   Comment  +--------+------------------+-----+----------+-------+  Brachial 171                      triphasic           +--------+------------------+-----+----------+-------+  PTA      222                1.23  triphasic            +--------+------------------+-----+----------+-------+  DP       197                1.09  monophasic          +--------+------------------+-----+----------+-------+ +-------+-----------+-----------+------------+------------+  ABI/TBI Today's ABI Today's TBI Previous ABI Previous TBI  +-------+-----------+-----------+------------+------------+  Right   BKA                     BKA                        +-------+-----------+-----------+------------+------------+  Left    1.23                    [place]                         +-------+-----------+-----------+------------+------------+  Summary: Left: Resting left ankle-brachial index is within normal range. No evidence of significant left lower extremity arterial disease. Unable to obtain TBI due to great toe size.  *See table(s) above for measurements and observations.  Electronically signed by Monica Martinez MD on 12/09/2021 at 2:33:42 PM.    Final    VAS [place] LOWER EXTREMITY VENOUS (DVT)  Result Date: 12/15/2021  Lower Venous DVT Study Patient Name:  [name]  Date of Exam:   12/14/2021 Medical Rec #: [id number]        Accession #:    [id number] Date of Birth: 06-08-1951        Patient Gender: M Patient Age:   74 years Exam Location:  [facility] Procedure:      VAS [place] LOWER EXTREMITY VENOUS (DVT) Referring Phys: [place] --------------------------------------------------------------------------------  Indications: Follow up exam - previous DVT.  Risk Factors: HX DVT & PE. Anticoagulation: Eliquis prior to admission. Limitations: Body habitus and poor ultrasound/tissue interface. Comparison Study: Previous LLEV exam on 12/07/2021 positive for DVT (chronic &                   age indeterminate FV) Performing Technologist: Jody Hill RVT, RDMS  Examination Guidelines: A complete evaluation includes B-mode imaging, spectral Doppler, color Doppler, and power Doppler as needed of all accessible portions of each vessel. Bilateral testing is considered an  integral part of a complete examination. Limited examinations for reoccurring indications may be performed as noted. The reflux portion of the exam is performed with the patient in reverse Trendelenburg.  +-----+---------------+---------+-----------+----------+--------------+  RIGHT Compressibility Phasicity Spontaneity Properties Thrombus Aging  +-----+---------------+---------+-----------+----------+--------------+  CFV   Full            Yes       Yes                                    +-----+---------------+---------+-----------+----------+--------------+   +---------+---------------+---------+-----------+----------+-------------------+  LEFT      Compressibility Phasicity Spontaneity Properties Thrombus Aging       +---------+---------------+---------+-----------+----------+-------------------+  CFV       Full            Yes       Yes                                         +---------+---------------+---------+-----------+----------+-------------------+  SFJ       Full                                                                  +---------+---------------+---------+-----------+----------+-------------------+  FV Prox   Partial         Yes       Yes                    Chronic              +---------+---------------+---------+-----------+----------+-------------------+  FV Mid    Partial         Yes       Yes                    Chronic              +---------+---------------+---------+-----------+----------+-------------------+  FV Distal None            No        No                     Chronic              +---------+---------------+---------+-----------+----------+-------------------+  PFV       Full                                                                  +---------+---------------+---------+-----------+----------+-------------------+  POP       Partial         Yes       Yes                    Chronic              +---------+---------------+---------+-----------+----------+-------------------+  PTV                                                         Not well visualized  +---------+---------------+---------+-----------+----------+-------------------+  PERO                                                       Not well visualized  +---------+---------------+---------+-----------+----------+-------------------+     Summary: RIGHT: - No evidence of common femoral vein obstruction.  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 - No cystic structure found in the popliteal fossa. - Ultrasound characteristics of enlarged lymph nodes noted in the groin.  *See table(s) above for measurements and observations. Electronically signed by Monica Martinez MD on 12/15/2021 at 10:26:18 AM.    Final    VAS [place] LOWER EXTREMITY VENOUS (DVT) (7a-7p)  Result Date: 12/08/2021  Lower Venous DVT Study Patient Name:  WAYBURN SHALER  Date of Exam:   12/07/2021 Medical Rec #: [id number]        Accession #:    [id number] Date of Birth: 01-08-1951        Patient Gender: M Patient Age:   38 years Exam Location:  [facility] Procedure:      VAS [place] LOWER EXTREMITY VENOUS (DVT) Referring Phys: Thelma Comp NANAVATI --------------------------------------------------------------------------------  Indications: Erythema,  Edema, and wounds.  Limitations: Body habitus, poor ultrasound/tissue interface, open wound and position. Comparison Study: 09/17/2021 left lower extremity venous duplex- age                   indeterminate DVT left mid and distal femoral vein. Performing Technologist: Maudry Mayhew MHA, RDMS, RVT, RDCS  Examination Guidelines: A complete evaluation includes B-mode imaging, spectral Doppler, color Doppler, and power Doppler as needed of all accessible portions of each vessel. Bilateral testing is considered an integral part of a complete examination. Limited examinations for reoccurring indications may be performed as noted. The reflux portion of  the exam is performed with the patient in reverse Trendelenburg.  Right Technical Findings: Not visualized segments include CFV.  +---------+---------------+---------+-----------+----------+-----------------+  LEFT      Compressibility Phasicity Spontaneity Properties Thrombus Aging     +---------+---------------+---------+-----------+----------+-----------------+  CFV       Full            Yes       Yes                                       +---------+---------------+---------+-----------+----------+-----------------+  SFJ       Full                                                                +---------+---------------+---------+-----------+----------+-----------------+  FV Prox   Partial         Yes       Yes                    Age Indeterminate  +---------+---------------+---------+-----------+----------+-----------------+  FV Mid    Partial                                          Age Indeterminate  +---------+---------------+---------+-----------+----------+-----------------+  FV Distal None                      No                     Chronic            +---------+---------------+---------+-----------+----------+-----------------+  PFV       Full                                                                +---------+---------------+---------+-----------+----------+-----------------+  POP       Full            Yes       Yes                                       +---------+---------------+---------+-----------+----------+-----------------+  PTV       Full  Yes                                       +---------+---------------+---------+-----------+----------+-----------------+   Left Technical Findings: Not visualized segments include peroneal veins, limited visualization left PTV.   Summary: LEFT: - Findings consistent with age indeterminate deep vein thrombosis involving the left proximal and mid femoral vein. - Findings consistent with chronic deep vein thrombosis involving the distal  left femoral vein. - No cystic structure found in the popliteal fossa.  When compared to prior study 09/17/2021, there appears to have been some proximal propagation to the proximal left femoral vein.  *See table(s) above for measurements and observations. Electronically signed by Orlie Pollen on 12/08/2021 at 11:43:22 AM.    Final     Microbiology: Results for orders placed or performed during the hospital encounter of 12/07/21  Resp Panel by RT-PCR (Flu A&B, Covid) Nasopharyngeal Swab     Status: None   Collection Time: 12/07/21  3:20 PM   Specimen: Nasopharyngeal Swab; Nasopharyngeal(NP) swabs in vial transport medium  Result Value Ref Range Status   SARS Coronavirus 2 by RT PCR NEGATIVE NEGATIVE Final    Comment: (NOTE) SARS-CoV-2 target nucleic acids are NOT DETECTED.  The SARS-CoV-2 RNA is generally detectable in upper respiratory specimens during the acute phase of infection. The lowest concentration of SARS-CoV-2 viral copies this assay can detect is 138 copies/mL. A negative result does not preclude SARS-Cov-2 infection and should not be used as the sole basis for treatment or other patient management decisions. A negative result may occur with  improper specimen collection/handling, submission of specimen other than nasopharyngeal swab, presence of viral mutation(s) within the areas targeted by this assay, and inadequate number of viral copies(<138 copies/mL). A negative result must be combined with clinical observations, patient history, and epidemiological information. The expected result is Negative.  Fact Sheet for Patients:  EntrepreneurPulse.com.au  Fact Sheet for Healthcare Providers:  IncredibleEmployment.be  This test is no t yet approved or cleared by the [place] FDA and  has been authorized for detection and/or diagnosis of SARS-CoV-2 by FDA under an Emergency Use Authorization (EUA). This EUA will remain  in effect  (meaning this test can be used) for the duration of the COVID-19 declaration under Section 564(b)(1) of the Act, 21 U.S.C.section 360bbb-3(b)(1), unless the authorization is terminated  or revoked sooner.       Influenza A by PCR NEGATIVE NEGATIVE Final   Influenza B by PCR NEGATIVE NEGATIVE Final    Comment: (NOTE) The Xpert Xpress SARS-CoV-2/FLU/RSV plus assay is intended as an aid in the diagnosis of influenza from Nasopharyngeal swab specimens and should not be used as a sole basis for treatment. Nasal washings and aspirates are unacceptable for Xpert Xpress SARS-CoV-2/FLU/RSV testing.  Fact Sheet for Patients: EntrepreneurPulse.com.au  Fact Sheet for Healthcare Providers: IncredibleEmployment.be  This test is not yet approved or cleared by the [place] FDA and has been authorized for detection and/or diagnosis of SARS-CoV-2 by FDA under an Emergency Use Authorization (EUA). This EUA will remain in effect (meaning this test can be used) for the duration of the COVID-19 declaration under Section 564(b)(1) of the Act, 21 U.S.C. section 360bbb-3(b)(1), unless the authorization is terminated or revoked.  Performed at [facility], [place] [address] [place], [place] [address]     Labs: CBC: Recent Labs  Lab 12/10/21 0522 12/12/21 0750 12/13/21  1829 12/14/21 0828 12/15/21 0559  WBC 5.7 5.1 5.1 5.4 5.8  HGB 10.5* 9.9* 10.1* 9.8* 9.9*  HCT 34.7* 31.5* 31.7* 31.3* 31.5*  MCV 104.5* 100.3* 102.3* 103.0* 104.0*  PLT 178 180 194 188 937   Basic Metabolic Panel: Recent Labs  Lab 12/09/21 0216 12/10/21 1603  NA 138 136  K 4.9 4.5  CL 107 104  CO2 23 23  GLUCOSE 98 115*  BUN 49* 35*  CREATININE 3.02* 2.63*  CALCIUM 7.5* 8.2*  MG  --  1.8   Liver Function Tests: No results for input(s): AST, ALT, ALKPHOS, BILITOT, PROT, ALBUMIN in the last 168 hours.  CBG: Recent Labs  Lab 12/14/21 0743 12/14/21 1136  12/14/21 1629 12/14/21 2018 12/15/21 0757  GLUCAP 112* 111* 128* 189* 119*    Discharge time spent: 35 minutes.  Signed: Antonieta Pert, MD [facility] 12/15/2021

## 2021-12-14 NOTE — Progress Notes (Addendum)
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     Oxymorphone Other (See Comments)    Causes kidney problems   Aspirin Nausea And Vomiting    Per [facility]   Beta Vulgaris Nausea And Vomiting    Beets   Buspirone Nausea And Vomiting and Other (See Comments)    Affected his head   Cabbage Nausea And Vomiting   Codeine Other (See Comments)    Unknown reaction   Dolobid [Diflunisal] Other (See Comments)    Unknown reaction   Fish Allergy Nausea And Vomiting   Fish-Derived Products Nausea And Vomiting   Methadone Other (See Comments)    Made him loopy   Pentazocine Other (See Comments)    Unknown reaction   Propoxyphene Nausea And Vomiting and Other (See Comments)    Messed with his head   Shellfish Allergy Nausea And Vomiting   Sulfa Antibiotics Hives   Sulfasalazine Other (See Comments)    Unknown reaction   Vistaril [Hydroxyzine] Nausea And Vomiting and Other (See Comments)    Didn't help   Amoxicillin Rash    Per [facility]    Patient Measurements: Height: 6\' 6"  (198.1 cm) Weight: 121.7 kg (268 lb 5.5 oz) IBW/kg (Calculated) : 91.4 Heparin Dosing Weight: 116 kg  Vital Signs: Temp: 98.2 F (36.8 C) (02/25 0431) Temp Source: Oral (02/25 0431) BP: 166/97 (02/25 0431) Pulse Rate: 63 (02/25 0431)  Labs: Recent Labs    12/12/21 0750 12/12/21 1917 12/13/21 0558 12/14/21 0828  HGB 9.9*  --  10.1* 9.8*  HCT 31.5*  --  31.7* 31.3*  PLT 180  --  194 188  APTT 111* 101* 96*  --   LABPROT 20.1*  --  20.4* 21.3*  INR 1.7*  --  1.7* 1.9*  HEPARINUNFRC 0.99*  --  0.59 0.57     Estimated Creatinine  Clearance: 39.4 mL/min (A) (by C-G formula based on SCr of 2.63 mg/dL (H)).  Assessment: Pharmacy consulted on admission to dose heparin for DVT worsening on apixaban therapy. On apixaban 5 mg po BID PTA - last dose 2/18 at 0730.    Transitioned back to apixaban 2/20 PM when determined that no vascular surgery planned.  Today, 2/21, transitioning apixaban to heparin/warfarin since considering DVT propagation as apixaban failure.   12/14/21  Confirmatory heparin level remains therapeutic on 2150 units/hr CBC: Hgb low but stable, Plts wnl INR still subtherapeutic but trending up appropriately SCr at baseline No bleeding or infusion issues per RN No major drug-drug interactions noted  Goal of Therapy:  Heparin level 0.3-0.7 units/ml INR 2-3 Monitor platelets by anticoagulation protocol: Yes   Plan:  Continue heparin infusion at 2150 units/hr Repeat warfarin 7.5 mg PO x 1 tonight - suspect maintenance dose will be ~ 5 to 7.5 mg daily At this point, would recommend discharging on 7.5 mg daily with INR check in 3-5 days (erring on aggressive side with worsening thrombus and CBC fairly stable) Daily CBC, INR, and heparin level Monitor CBC daily and signs/symptoms of bleeding  Reuel Boom, PharmD, BCPS [phone] 12/14/2021, 9:20 AM

## 2021-12-14 NOTE — Progress Notes (Signed)
Patient will be discharging with brother today.

## 2021-12-15 DIAGNOSIS — L03116 Cellulitis of left lower limb: Secondary | ICD-10-CM | POA: Diagnosis not present

## 2021-12-15 LAB — GLUCOSE, CAPILLARY
Glucose-Capillary: 119 mg/dL — ABNORMAL HIGH (ref 70–99)
Glucose-Capillary: 131 mg/dL — ABNORMAL HIGH (ref 70–99)
Glucose-Capillary: 125 mg/dL — ABNORMAL HIGH (ref 70–99)
Glucose-Capillary: 165 mg/dL — ABNORMAL HIGH (ref 70–99)

## 2021-12-15 LAB — PROTIME-INR
INR: 2.1 — ABNORMAL HIGH (ref 0.8–1.2)
Prothrombin Time: 23.7 seconds — ABNORMAL HIGH (ref 11.4–15.2)

## 2021-12-15 LAB — CBC
HCT: 31.5 % — ABNORMAL LOW (ref 39.0–52.0)
Hemoglobin: 9.9 g/dL — ABNORMAL LOW (ref 13.0–17.0)
MCH: 32.7 pg (ref 26.0–34.0)
MCHC: 31.4 g/dL (ref 30.0–36.0)
MCV: 104 fL — ABNORMAL HIGH (ref 80.0–100.0)
Platelets: 204 10*3/uL (ref 150–400)
RBC: 3.03 MIL/uL — ABNORMAL LOW (ref 4.22–5.81)
RDW: 16 % — ABNORMAL HIGH (ref 11.5–15.5)
WBC: 5.8 10*3/uL (ref 4.0–10.5)
nRBC: 0 % (ref 0.0–0.2)

## 2021-12-15 LAB — HEPARIN LEVEL (UNFRACTIONATED): Heparin Unfractionated: 0.51 IU/mL (ref 0.30–0.70)

## 2021-12-15 MED ORDER — WARFARIN SODIUM 5 MG PO TABS
7.5000 mg | ORAL_TABLET | Freq: Once | ORAL | Status: AC
  Administered 2021-12-15: 7.5 mg via ORAL
  Filled 2021-12-15: qty 1

## 2021-12-15 MED ORDER — FERROUS SULFATE 325 (65 FE) MG PO TABS: 325.0000 mg | ORAL_TABLET | Freq: Every day | ORAL | 0 refills | Status: DC

## 2021-12-15 NOTE — Progress Notes (Signed)
Patient ambulated with front wheel walker and prosthetic 20 ft with this nurse. Tolerated well.

## 2021-12-15 NOTE — TOC Transition Note (Addendum)
Transition of Care [facility]) - CM/SW Discharge Note   Patient Details  Name: Thomas [name] MRN: [id number] Date of Birth: 30-May-1951  Transition of Care [facility]) CM/SW Contact:  Ross Ludwig, LCSW Phone Number: 12/15/2021, 11:11 AM   Clinical Narrative:     Patient will be going home with home health PT, OT, and RN, through [place].  CSW signing off please reconsult with any other social work needs, home health agency has been notified of planned discharge.  Patient's to discharge back home today.  12:30pm  Patient does not have a key to get home and he does not have a ride available today.  Patient's brother will pick him up tomorrow.  CSW documented avoidable days.     Barriers to Discharge: No Barriers Identified   Patient Goals and CMS Choice   CMS Medicare.gov Compare Post Acute Care list provided to:: Patient Choice offered to / list presented to : Patient  Discharge Placement                       Discharge Plan and Services   Discharge Planning Services: CM Consult Post Acute Care Choice: Home Health                    HH Arranged: PT, RN, OT [facility] Agency: [place] Date [place]: 12/11/21 Time [place]: 9935 Representative spoke with at [place]: [place] ([place]) Interventions     Readmission Risk Interventions Readmission Risk Prevention Plan 12/12/2021 09/20/2021 09/17/2019  Transportation Screening Complete Complete Complete  PCP or Specialist Appt within 5-7 Days - - Not Complete  Not Complete comments - - snf vs home  PCP or Specialist Appt within 3-5 Days - Complete -  Home Care Screening - - Complete  Medication Review (RN CM) - - Complete  HRI or [place] - Complete -  Social Work Consult for [place] Planning/Counseling - Complete -  Palliative Care Screening - Not Applicable -  Medication Review Press photographer) Complete Complete -  PCP or Specialist  appointment within 3-5 days of discharge Complete - -  [place] or Home Care Consult Complete - -  SW Recovery Care/Counseling Consult Complete - -  [place] Patient Refused - -  Some recent data might be hidden

## 2021-12-15 NOTE — Progress Notes (Signed)
Called brother to arrange discharge plans, left a voicemail. Will call patients' brother again.

## 2021-12-15 NOTE — Progress Notes (Signed)
Patient's brother BJ did call this nurse back. This nurse let him know that the patient is ready for discharge. The brother said he was not available to not pick him up today. The brother stated he can be here to pick up the patient around 10:30am-11am tomorrow morning.   Yesterday afternoon this nurse spoke with the brother and he knew the patient would be discharged later yesterday evening or today. We were waiting on ultrasound results and labs.

## 2021-12-15 NOTE — Progress Notes (Signed)
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     Oxymorphone Other (See Comments)    Causes kidney problems   Aspirin Nausea And Vomiting    Per [facility]   Beta Vulgaris Nausea And Vomiting    Beets   Buspirone Nausea And Vomiting and Other (See Comments)    Affected his head   Cabbage Nausea And Vomiting   Codeine Other (See Comments)    Unknown reaction   Dolobid [Diflunisal] Other (See Comments)    Unknown reaction   Fish Allergy Nausea And Vomiting   Fish-Derived Products Nausea And Vomiting   Methadone Other (See Comments)    Made him loopy   Pentazocine Other (See Comments)    Unknown reaction   Propoxyphene Nausea And Vomiting and Other (See Comments)    Messed with his head   Shellfish Allergy Nausea And Vomiting   Sulfa Antibiotics Hives   Sulfasalazine Other (See Comments)    Unknown reaction   Vistaril [Hydroxyzine] Nausea And Vomiting and Other (See Comments)    Didn't help   Amoxicillin Rash    Per [facility]    Patient Measurements: Height: 6\' 6"  (198.1 cm) Weight: 121.7 kg (268 lb 5.5 oz) IBW/kg (Calculated) : 91.4 Heparin Dosing Weight: 116 kg  Vital Signs: Temp: 97.8 F (36.6 C) (02/26 0502) Temp Source: Oral (02/26 0502) BP: 161/87 (02/26 0502) Pulse Rate: 63 (02/26 0502)  Labs: Recent Labs    12/12/21 1917 12/13/21 0558 12/13/21 0558 12/14/21 0828 12/15/21 0559  HGB  --  10.1*   < > 9.8* 9.9*  HCT  --  31.7*  --  31.3* 31.5*  PLT  --  194  --  188 204  APTT 101* 96*  --   --   --   LABPROT  --  20.4*  --  21.3* 23.7*  INR  --  1.7*  --  1.9* 2.1*  HEPARINUNFRC  --  0.59   --  0.57 0.51   < > = values in this interval not displayed.     Estimated Creatinine Clearance: 39.4 mL/min (A) (by C-G formula based on SCr of 2.63 mg/dL (H)).  Assessment: Pharmacy consulted on admission to dose heparin for DVT worsening on apixaban therapy. On apixaban 5 mg po BID PTA - last dose 2/18 at 0730.    Transitioned back to apixaban 2/20 PM when determined that no vascular surgery planned.  Today, 2/21, transitioning apixaban to heparin/warfarin since considering DVT propagation as apixaban failure.   12/15/21  Confirmatory heparin level remains therapeutic on 2150 units/hr CBC: Hgb low but stable, Plts wnl INR now therapeutic and trending up appropriately SCr at baseline No bleeding or infusion issues per RN No major drug-drug interactions noted  Goal of Therapy:  Heparin level 0.3-0.7 units/ml INR 2-3 Monitor platelets by anticoagulation protocol: Yes   Plan:  Stop heparin with therapeutic INR Repeat warfarin 7.5 mg PO x 1 today - suspect maintenance dose will be between 5-7.5 mg daily At this point, would recommend discharging on 7.5 mg daily with INR check in 3-5 days Daily INR, CBC q72 hr Monitor CBC daily and signs/symptoms of bleeding  Reuel Boom, PharmD, BCPS [phone] 12/15/2021, 8:29 AM

## 2021-12-16 LAB — GLUCOSE, CAPILLARY
Glucose-Capillary: 117 mg/dL — ABNORMAL HIGH (ref 70–99)
Glucose-Capillary: 130 mg/dL — ABNORMAL HIGH (ref 70–99)

## 2021-12-16 LAB — PROTIME-INR
INR: 2.3 — ABNORMAL HIGH (ref 0.8–1.2)
Prothrombin Time: 25.1 seconds — ABNORMAL HIGH (ref 11.4–15.2)

## 2021-12-16 MED ORDER — WARFARIN SODIUM 5 MG PO TABS: 7.5000 mg | ORAL_TABLET | Freq: Once | ORAL | Status: DC

## 2021-12-16 NOTE — Progress Notes (Signed)
Physical Therapy Treatment Patient Details Name: Thomas [name] MRN: [id number] DOB: [birth year] Today's Date: 12/16/2021   History of Present Illness 71 yo male admitted with L LE cellulitis, worsening L LE DVT. Hx of chronic pain, back surgeries, DVT, PE, DM, CKD, R BKA, HF, gout    PT Comments    General Comments: AxO x 3 agreeable to walk but "not in the hallway because I don't have my left shoe here and I don't want to slip like I did when they took me out there the other day".General bed mobility comments: "I sleep in my recliner" but he was able to self get OOB. General transfer comment: Pt able to self DON his right prosthetic leg while seated EOB.  "block that wheel" stated pt.  "I don't want to fall just before I go home".  Pt self able to rise from elevated bed to walker.  Goof use B UE's for support.General Gait Details: pt only agreed to amb in room. With right prosthetic leg and walker, pt did well.  "I get by with what I have". Pt plans to D/C back home.     Recommendations for follow up therapy are one component of a multi-disciplinary discharge planning process, led by the attending physician.  Recommendations may be updated based on patient status, additional functional criteria and insurance authorization.  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    Transfers Overall transfer level: Needs assistance Equipment used: Rolling walker (2 wheels) Transfers: Sit to/from Stand Sit to Stand: Supervision           General transfer comment: Pt able to self DON his right prosthetic leg while seated EOB.  "block that wheel" stated pt.  "I don't want to fall just before I go home".  Pt self able to rise from elevated bed to walker.  Goof use B UE's for support.    Ambulation/Gait Ambulation/Gait assistance: Supervision Gait Distance (Feet): 11 Feet Assistive device: Rolling walker (2 wheels) Gait Pattern/deviations: Step-to pattern, Decreased step length - left, Decreased step length - right Gait velocity: decreased     General Gait Details: pt only agreed to amb in room. With right prosthetic leg and walker, pt did well.  "I get by with what I have".   Stairs             Wheelchair Mobility    Modified Rankin (Stroke Patients Only)       Balance                                            Cognition Arousal/Alertness: Awake/alert Behavior During Therapy: WFL for tasks assessed/performed Overall Cognitive Status: Within Functional Limits for tasks assessed  General Comments: AxO x 3 agreeable to walk but "not in the hallway because I don't have my left shoe here and I don't want to slip like I did when they took me out there the other day".        Exercises      General Comments        Pertinent Vitals/Pain Pain Assessment Pain Assessment: No/denies pain    Home Living                          Prior Function            PT Goals (current goals can now be found in the care plan section)      Frequency    Min 3X/week      PT Plan Current plan remains appropriate    Co-evaluation              AM-PAC PT "6 Clicks" Mobility   Outcome Measure  Help needed turning from  your back to your side while in a flat bed without using bedrails?: None Help needed moving from lying on your back to sitting on the side of a flat bed without using bedrails?: None Help needed moving to and from a bed to a chair (including a wheelchair)?: A Little Help needed standing up from a chair using your arms (e.g., wheelchair or bedside chair)?: A Little Help needed to walk in hospital room?: A Little Help needed climbing 3-5 steps with a railing? : A Lot 6 Click Score: 19    End of Session Equipment Utilized During Treatment: Gait belt Activity Tolerance: Patient tolerated treatment well Patient left: in bed;with call bell/phone within reach;with bed alarm set (sitting EOB) Nurse Communication: Mobility status PT Visit Diagnosis: Muscle weakness (generalized) (M62.81);Difficulty in walking, not elsewhere classified (R26.2)     Time: 4982-6415 PT Time Calculation (min) (ACUTE ONLY): 14 min  Charges:  $Gait Training: 8-22 mins                     {Kerilyn Cortner  PTA Acute  Rehabilitation Services Pager      [phone] Office      [phone]

## 2021-12-16 NOTE — TOC Transition Note (Signed)
Transition of Care [facility]) - CM/SW Discharge Note   Patient Details  Name: Thomas [name] MRN: [id number] Date of Birth: 19-Dec-1950  [facility]) CM/SW Contact:  Ross Ludwig, LCSW Phone Number: 12/16/2021, 10:11 AM   Clinical Narrative:     Patient will be going home with home health through [place].  CSW signing off please reconsult with any other social work needs, home health agency has been notified of planned discharge.  Per physician, patient will need INR checked between two and three days.  CSW notified Alvis Lemmings, [place] signing off, please reconsult if social work needs arise.  Patient does not need any equipment.   Final next level of care: [place] Barriers to Discharge: Barriers Resolved   Patient Goals and CMS Choice Patient states their goals for this hospitalization and ongoing recovery are:: To return back home with home health. CMS Medicare.gov Compare Post Acute Care list provided to:: Patient Choice offered to / list presented to : Patient  Discharge Placement  Home with home health.                     Discharge Plan and Services   Discharge Planning Services: CM Consult Post Acute Care Choice: Home Health                    HH Arranged: PT, OT, RN [facility] Agency: [place] Date [place]: 12/16/21 Time HH Agency Contacted: 1011 Representative spoke with at [place]: [place] Determinants of Health ([place]) Interventions     Readmission Risk Interventions Readmission Risk Prevention Plan 12/12/2021 09/20/2021 09/17/2019  Transportation Screening Complete Complete Complete  PCP or Specialist Appt within 5-7 Days - - Not Complete  Not Complete comments - - snf vs home  PCP or Specialist Appt within 3-5 Days - Complete -  Home Care Screening - - Complete  Medication Review (RN CM) - - Complete  HRI or [place] - Complete -  Social Work Consult for [place] Planning/Counseling  - Complete -  Palliative Care Screening - Not Applicable -  Medication Review Press photographer) Complete Complete -  PCP or Specialist appointment within 3-5 days of discharge Complete - -  [place] or Home Care Consult Complete - -  SW Recovery Care/Counseling Consult Complete - -  [place] Patient Refused - -  Some recent data might be hidden

## 2021-12-16 NOTE — Progress Notes (Signed)
Seen and examined this morning.  Patient reports his left leg feels better.  He has no new complaints. Discharge medication reviewed remains unchanged He is waiting for his ride today to go to his home. He knows that he needs to get his INR checked in 2 to 3 days and follow-up with PCP to continue to adjust his Coumadin dosing.  Home health care has been set up

## 2021-12-16 NOTE — Progress Notes (Addendum)
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     Oxymorphone Other (See Comments)    Causes kidney problems   Aspirin Nausea And Vomiting    Per [facility]   Beta Vulgaris Nausea And Vomiting    Beets   Buspirone Nausea And Vomiting and Other (See Comments)    Affected his head   Cabbage Nausea And Vomiting   Codeine Other (See Comments)    Unknown reaction   Dolobid [Diflunisal] Other (See Comments)    Unknown reaction   Fish Allergy Nausea And Vomiting   Fish-Derived Products Nausea And Vomiting   Methadone Other (See Comments)    Made him loopy   Pentazocine Other (See Comments)    Unknown reaction   Propoxyphene Nausea And Vomiting and Other (See Comments)    Messed with his head   Shellfish Allergy Nausea And Vomiting   Sulfa Antibiotics Hives   Sulfasalazine Other (See Comments)    Unknown reaction   Vistaril [Hydroxyzine] Nausea And Vomiting and Other (See Comments)    Didn't help   Amoxicillin Rash    Per [facility]    Patient Measurements: Height: 6\' 6"  (198.1 cm) Weight: 121.7 kg (268 lb 5.5 oz) IBW/kg (Calculated) : 91.4 Heparin Dosing Weight: 116 kg  Vital Signs: Temp: 97.6 F (36.4 C) (02/27 0240) Temp Source: Oral (02/27 0240) BP: 147/73 (02/27 0240) Pulse Rate: 60 (02/27 0240)  Labs: Recent Labs    12/14/21 0828 12/15/21 0559 12/16/21 0515  HGB 9.8* 9.9*  --   HCT 31.3* 31.5*  --   PLT 188 204  --   LABPROT 21.3* 23.7* 25.1*  INR 1.9* 2.1* 2.3*  HEPARINUNFRC 0.57 0.51  --      Estimated Creatinine Clearance: 39.4 mL/min (A) (by C-G formula based on SCr of 2.63 mg/dL  (H)).  Assessment: Pharmacy consulted on admission to dose heparin for DVT worsening on apixaban therapy. On apixaban 5 mg po BID PTA - last dose 2/18 at 0730.    Transitioned back to apixaban 2/20 PM when determined that no vascular surgery planned.  Today, 2/21, transitioning apixaban to heparin/warfarin since considering DVT propagation as apixaban failure.  Heparin was discontinued 2/26 after proper overlap with warfarin and INR therapeutic x 24 hours   12/16/21  INR therapeutic, trending up  Goal of Therapy:  INR 2-3   Plan:  Repeat warfarin 7.5 mg PO x 1 this afternoon  Suspect maintenance warfarin dosage will be between 5-7.5 mg daily Recommend discharging on 7.5 mg daily but needs INR rechecked tomorrow (12/17/21) with results reported to primary MD for adjustment of warfarin dosage.  Placed TOC consult for this and discussed with case manager on progression rounds this AM. Reviewed above plan with patient in detail.  He expresses understanding.     [place] Pert, PharmD, [place] [phone] 12/16/2021  6:56 AM

## 2021-12-18 ENCOUNTER — Other Ambulatory Visit: Payer: Self-pay

## 2021-12-18 ENCOUNTER — Encounter (HOSPITAL_COMMUNITY): Payer: Self-pay

## 2021-12-18 ENCOUNTER — Inpatient Hospital Stay (HOSPITAL_COMMUNITY)
Admission: EM | Admit: 2021-12-18 | Discharge: 2021-12-24 | DRG: 641 | Disposition: A | Discharge status: Home Health | Payer: Medicare Other | Attending: Family Medicine | Admitting: Family Medicine

## 2021-12-18 DIAGNOSIS — E875 Hyperkalemia: Secondary | ICD-10-CM | POA: Diagnosis not present

## 2021-12-18 DIAGNOSIS — Z885 Allergy status to narcotic agent status: Secondary | ICD-10-CM

## 2021-12-18 DIAGNOSIS — Z882 Allergy status to sulfonamides status: Secondary | ICD-10-CM

## 2021-12-18 DIAGNOSIS — Z95828 Presence of other vascular implants and grafts: Secondary | ICD-10-CM

## 2021-12-18 DIAGNOSIS — Z89422 Acquired absence of other left toe(s): Secondary | ICD-10-CM

## 2021-12-18 DIAGNOSIS — I5042 Chronic combined systolic (congestive) and diastolic (congestive) heart failure: Secondary | ICD-10-CM

## 2021-12-18 DIAGNOSIS — G894 Chronic pain syndrome: Secondary | ICD-10-CM | POA: Diagnosis present

## 2021-12-18 DIAGNOSIS — N1330 Unspecified hydronephrosis: Secondary | ICD-10-CM | POA: Diagnosis present

## 2021-12-18 DIAGNOSIS — Z881 Allergy status to other antibiotic agents status: Secondary | ICD-10-CM

## 2021-12-18 DIAGNOSIS — N189 Chronic kidney disease, unspecified: Secondary | ICD-10-CM

## 2021-12-18 DIAGNOSIS — E785 Hyperlipidemia, unspecified: Secondary | ICD-10-CM | POA: Diagnosis present

## 2021-12-18 DIAGNOSIS — N400 Enlarged prostate without lower urinary tract symptoms: Secondary | ICD-10-CM | POA: Diagnosis present

## 2021-12-18 DIAGNOSIS — E872 Acidosis, unspecified: Secondary | ICD-10-CM | POA: Diagnosis present

## 2021-12-18 DIAGNOSIS — E039 Hypothyroidism, unspecified: Secondary | ICD-10-CM | POA: Diagnosis present

## 2021-12-18 DIAGNOSIS — E669 Obesity, unspecified: Secondary | ICD-10-CM | POA: Diagnosis present

## 2021-12-18 DIAGNOSIS — N261 Atrophy of kidney (terminal): Secondary | ICD-10-CM | POA: Diagnosis present

## 2021-12-18 DIAGNOSIS — I872 Venous insufficiency (chronic) (peripheral): Secondary | ICD-10-CM | POA: Diagnosis present

## 2021-12-18 DIAGNOSIS — Z86718 Personal history of other venous thrombosis and embolism: Secondary | ICD-10-CM

## 2021-12-18 DIAGNOSIS — Z86711 Personal history of pulmonary embolism: Secondary | ICD-10-CM

## 2021-12-18 DIAGNOSIS — Z91018 Allergy to other foods: Secondary | ICD-10-CM

## 2021-12-18 DIAGNOSIS — Z89511 Acquired absence of right leg below knee: Secondary | ICD-10-CM

## 2021-12-18 DIAGNOSIS — K219 Gastro-esophageal reflux disease without esophagitis: Secondary | ICD-10-CM | POA: Diagnosis present

## 2021-12-18 DIAGNOSIS — N179 Acute kidney failure, unspecified: Secondary | ICD-10-CM

## 2021-12-18 DIAGNOSIS — E1151 Type 2 diabetes mellitus with diabetic peripheral angiopathy without gangrene: Secondary | ICD-10-CM | POA: Diagnosis present

## 2021-12-18 DIAGNOSIS — I132 Hypertensive heart and chronic kidney disease with heart failure and with stage 5 chronic kidney disease, or end stage renal disease: Secondary | ICD-10-CM | POA: Diagnosis present

## 2021-12-18 DIAGNOSIS — Z6830 Body mass index (BMI) 30.0-30.9, adult: Secondary | ICD-10-CM

## 2021-12-18 DIAGNOSIS — Z7901 Long term (current) use of anticoagulants: Secondary | ICD-10-CM

## 2021-12-18 DIAGNOSIS — E1122 Type 2 diabetes mellitus with diabetic chronic kidney disease: Secondary | ICD-10-CM | POA: Diagnosis present

## 2021-12-18 DIAGNOSIS — M109 Gout, unspecified: Secondary | ICD-10-CM | POA: Diagnosis present

## 2021-12-18 DIAGNOSIS — N185 Chronic kidney disease, stage 5: Secondary | ICD-10-CM | POA: Diagnosis present

## 2021-12-18 DIAGNOSIS — Z79890 Hormone replacement therapy (postmenopausal): Secondary | ICD-10-CM

## 2021-12-18 DIAGNOSIS — L309 Dermatitis, unspecified: Secondary | ICD-10-CM | POA: Diagnosis present

## 2021-12-18 DIAGNOSIS — Z888 Allergy status to other drugs, medicaments and biological substances status: Secondary | ICD-10-CM

## 2021-12-18 DIAGNOSIS — I161 Hypertensive emergency: Secondary | ICD-10-CM | POA: Diagnosis present

## 2021-12-18 DIAGNOSIS — R310 Gross hematuria: Secondary | ICD-10-CM | POA: Diagnosis present

## 2021-12-18 DIAGNOSIS — Z22322 Carrier or suspected carrier of Methicillin resistant Staphylococcus aureus: Secondary | ICD-10-CM

## 2021-12-18 DIAGNOSIS — Z91013 Allergy to seafood: Secondary | ICD-10-CM

## 2021-12-18 DIAGNOSIS — Z79899 Other long term (current) drug therapy: Secondary | ICD-10-CM

## 2021-12-18 DIAGNOSIS — Z20822 Contact with and (suspected) exposure to covid-19: Secondary | ICD-10-CM | POA: Diagnosis present

## 2021-12-18 DIAGNOSIS — D631 Anemia in chronic kidney disease: Secondary | ICD-10-CM | POA: Diagnosis present

## 2021-12-18 LAB — COMPREHENSIVE METABOLIC PANEL
ALT: 9 U/L (ref 0–44)
AST: 25 U/L (ref 15–41)
Albumin: 3.6 g/dL (ref 3.5–5.0)
Alkaline Phosphatase: 82 U/L (ref 38–126)
Anion gap: 12 (ref 5–15)
BUN: 73 mg/dL — ABNORMAL HIGH (ref 8–23)
CO2: 18 mmol/L — ABNORMAL LOW (ref 22–32)
Calcium: 7.2 mg/dL — ABNORMAL LOW (ref 8.9–10.3)
Chloride: 101 mmol/L (ref 98–111)
Creatinine, Ser: 5.95 mg/dL — ABNORMAL HIGH (ref 0.61–1.24)
GFR, Estimated: 10 mL/min — ABNORMAL LOW (ref >60)
Glucose, Bld: 117 mg/dL — ABNORMAL HIGH (ref 70–99)
Potassium: 7.2 mmol/L (ref 3.5–5.1)
Sodium: 131 mmol/L — ABNORMAL LOW (ref 135–145)
Total Bilirubin: 0.3 mg/dL (ref 0.3–1.2)
Total Protein: 8.2 g/dL — ABNORMAL HIGH (ref 6.5–8.1)

## 2021-12-18 LAB — RESP PANEL BY RT-PCR (FLU A&B, COVID) ARPGX2
Influenza A by PCR: NEGATIVE
Influenza B by PCR: NEGATIVE
SARS Coronavirus 2 by RT PCR: NEGATIVE

## 2021-12-18 LAB — CBC WITH DIFFERENTIAL/PLATELET
Abs Immature Granulocytes: 0.04 10*3/uL (ref 0.00–0.07)
Basophils Absolute: 0 10*3/uL (ref 0.0–0.1)
Basophils Relative: 0 %
Eosinophils Absolute: 0.3 10*3/uL (ref 0.0–0.5)
Eosinophils Relative: 3 %
HCT: 31 % — ABNORMAL LOW (ref 39.0–52.0)
Hemoglobin: 9.3 g/dL — ABNORMAL LOW (ref 13.0–17.0)
Immature Granulocytes: 0 %
Lymphocytes Relative: 13 %
Lymphs Abs: 1.3 10*3/uL (ref 0.7–4.0)
MCH: 32 pg (ref 26.0–34.0)
MCHC: 30 g/dL (ref 30.0–36.0)
MCV: 106.5 fL — ABNORMAL HIGH (ref 80.0–100.0)
Monocytes Absolute: 0.8 10*3/uL (ref 0.1–1.0)
Monocytes Relative: 8 %
Neutro Abs: 7.4 10*3/uL (ref 1.7–7.7)
Neutrophils Relative %: 76 %
Platelets: 186 10*3/uL (ref 150–400)
RBC: 2.91 MIL/uL — ABNORMAL LOW (ref 4.22–5.81)
RDW: 16.7 % — ABNORMAL HIGH (ref 11.5–15.5)
WBC: 9.9 10*3/uL (ref 4.0–10.5)
nRBC: 0 % (ref 0.0–0.2)

## 2021-12-18 MED ORDER — SODIUM CHLORIDE 0.9 % IV BOLUS
1000.0000 mL | Freq: Once | INTRAVENOUS | Status: AC
  Administered 2021-12-18: 1000 mL via INTRAVENOUS

## 2021-12-18 MED ORDER — INSULIN ASPART 100 UNIT/ML IV SOLN
5.0000 [IU] | Freq: Once | INTRAVENOUS | Status: AC
  Administered 2021-12-19: 5 [IU] via INTRAVENOUS
  Filled 2021-12-18: qty 0.05

## 2021-12-18 MED ORDER — ACETAMINOPHEN 325 MG PO TABS
650.0000 mg | ORAL_TABLET | Freq: Once | ORAL | Status: DC
  Filled 2021-12-18: qty 2

## 2021-12-18 MED ORDER — SODIUM ZIRCONIUM CYCLOSILICATE 10 G PO PACK
10.0000 g | PACK | Freq: Once | ORAL | Status: AC
  Administered 2021-12-19: 10 g via ORAL
  Filled 2021-12-18: qty 1

## 2021-12-18 MED ORDER — DEXTROSE 50 % IV SOLN
1.0000 | Freq: Once | INTRAVENOUS | Status: AC
  Administered 2021-12-19: 50 mL via INTRAVENOUS
  Filled 2021-12-18: qty 50

## 2021-12-18 NOTE — ED Provider Notes (Signed)
[facility] DEPT Provider Note   CSN: [id number] Arrival date & time: 12/18/21  2052     History  Chief Complaint  Patient presents with   Back Pain    Thomas [name] is a 71 y.o. male.  Patient presents to ER chief complaint of generalized weakness continued back pain, asthma, intermittent spasms.  He was recently discharged for left lower extremity DVT and cellulitis.  At home patient states that he is at increased fatigue and increased weakness difficult time getting out of bed.  States has been having spasms of lower back pain when he tries to get out of bed.  Denies fevers or vomiting or cough.  Denies diarrhea.      Home Medications Prior to Admission medications   Medication Sig Start Date End Date Taking? Authorizing Provider  acetaminophen (TYLENOL) 500 MG tablet Take 500 mg by mouth every 6 (six) hours as needed for moderate pain.    [provider]  allopurinol (ZYLOPRIM) 100 MG tablet Take 100 mg by mouth daily.     [provider]  amLODipine (NORVASC) 5 MG tablet Take 5 mg by mouth daily. 11/26/21   [provider]  atorvastatin (LIPITOR) 80 MG tablet Take 1 tablet (80 mg total) by mouth daily. Patient not taking: Reported on 12/07/2021 09/25/20   Jose Persia, MD  bisacodyl (DULCOLAX) 10 MG suppository Place 10 mg rectally at bedtime.    [provider]  cloNIDine (CATAPRES) 0.1 MG tablet Take 0.1 mg by mouth 3 (three) times daily. 11/26/21   [provider]  colchicine 0.6 MG tablet Take 0.6 mg by mouth daily. 08/21/21   [provider]  ferrous sulfate 325 (65 FE) MG tablet Take 1 tablet (325 mg total) by mouth daily with breakfast. 12/15/21 01/14/22  Antonieta Pert, MD  finasteride (PROSCAR) 5 MG tablet Take 1 tablet (5 mg total) by mouth daily. 10/10/19   Hosie Poisson, MD  furosemide (LASIX) 40 MG tablet Take 40 mg by mouth daily. 11/26/21   [provider]  gabapentin (NEURONTIN)  300 MG capsule Take 1 capsule (300 mg total) by mouth at bedtime. Patient taking differently: Take 300 mg by mouth 3 (three) times daily. 06/06/21   Nita Sells, MD  hydrALAZINE (APRESOLINE) 100 MG tablet Take 1 tablet by mouth every 8 hours. Patient taking differently: Take 100 mg by mouth every 8 (eight) hours as needed (high blood pressure >150/100). 10/01/21 12/07/21  Kayleen Memos, DO  levothyroxine (SYNTHROID) 50 MCG tablet Take 50 mcg by mouth daily before breakfast.     [provider]  methocarbamol (ROBAXIN) 750 MG tablet Take 1 tablet (750 mg total) by mouth every 8 (eight) hours as needed for muscle spasms. 06/06/21   Nita Sells, MD  metoprolol tartrate (LOPRESSOR) 100 MG tablet Take 0.5 tablets (50 mg total) by mouth daily. 10/23/21   Georgette Shell, MD  MOVANTIK 25 MG TABS tablet Take 25 mg by mouth daily. 11/26/21   [provider]  OXYCONTIN 30 MG 12 hr tablet Take 1 tablet (30 mg total) by mouth 2 (two) times daily. Patient taking differently: Take 30 mg by mouth 2 (two) times daily as needed (pain). 10/14/21   Danford, Suann Larry, MD  pantoprazole (PROTONIX) 40 MG tablet Take 1 tablet by mouth 2 times daily. Patient not taking: Reported on 12/07/2021 10/01/21 10/31/21  Kayleen Memos, DO  polyethylene glycol (MIRALAX) 17 g packet Take 17 g by mouth daily as  needed. Patient taking differently: Take 17 g by mouth daily as needed (constipation). 10/01/21   Kayleen Memos, DO  senna-docusate (SENOKOT-S) 8.6-50 MG tablet Take 1 tablet by mouth 2 (two) times daily for 7 days. 12/14/21 12/21/21  Antonieta Pert, MD  tamsulosin (FLOMAX) 0.4 MG CAPS capsule Take 1 capsule (0.4 mg total) by mouth daily. 09/25/20   Jose Persia, MD  tiZANidine (ZANAFLEX) 4 MG tablet Take 4 mg by mouth 3 (three) times daily. 11/26/21   [provider]  warfarin (COUMADIN) 2.5 MG tablet Take 3 tablets (7.5 mg total) by mouth one time only at 4 PM. INR check in 3 days and  adjust coumadin dose for INR of 2-3 12/14/21 01/13/22  Antonieta Pert, MD      Allergies    Other, Oxymorphone, Aspirin, Beta vulgaris, Buspirone, Cabbage, Codeine, Dolobid [diflunisal], Fish allergy, Fish-derived products, Methadone, Pentazocine, Propoxyphene, Shellfish allergy, Sulfa antibiotics, Sulfasalazine, Vistaril [hydroxyzine], and Amoxicillin    Review of Systems   Review of Systems  Constitutional:  Negative for fever.  HENT:  Negative for ear pain and sore throat.   Eyes:  Negative for pain.  Respiratory:  Negative for cough.   Cardiovascular:  Negative for chest pain.  Gastrointestinal:  Negative for abdominal pain.  Genitourinary:  Negative for flank pain.  Musculoskeletal:  Positive for back pain.  Skin:  Negative for color change and rash.  Neurological:  Negative for syncope.  All other systems reviewed and are negative.  Physical Exam Updated Vital Signs BP 135/77    Pulse 79    Temp 98.2 F (36.8 C) (Oral)    Resp 18    Ht 6\' 5"  (1.956 m)    Wt 121.7 kg    SpO2 93%    BMI 31.82 kg/m  Physical Exam Constitutional:      Appearance: He is well-developed.  HENT:     Head: Normocephalic.     Nose: Nose normal.  Eyes:     Extraocular Movements: Extraocular movements intact.  Cardiovascular:     Rate and Rhythm: Normal rate.  Pulmonary:     Effort: Pulmonary effort is normal.  Musculoskeletal:     Comments: No C or T or L-spine step-offs noted.  Skin:    Coloration: Skin is not jaundiced.  Neurological:     Mental Status: He is alert. Mental status is at baseline.     Comments: Patient presents with generalized weakness.  He can move his lower extremities but having difficult time lifting them off the bed without pain.    ED Results / Procedures / Treatments   Labs (all labs ordered are listed, but only abnormal results are displayed) Labs Reviewed  CBC WITH DIFFERENTIAL/PLATELET - Abnormal; Notable for the following components:      Result Value   RBC 2.91  (*)    Hemoglobin 9.3 (*)    HCT 31.0 (*)    MCV 106.5 (*)    RDW 16.7 (*)    All other components within normal limits  COMPREHENSIVE METABOLIC PANEL - Abnormal; Notable for the following components:   Sodium 131 (*)    Potassium 7.2 (*)    CO2 18 (*)    Glucose, Bld 117 (*)    BUN 73 (*)    Creatinine, Ser 5.95 (*)    Calcium 7.2 (*)    Total Protein 8.2 (*)    GFR, Estimated 10 (*)    All other components within normal limits  RESP PANEL BY RT-PCR (  FLU A&B, COVID) ARPGX2  URINALYSIS, ROUTINE W REFLEX MICROSCOPIC  PROTIME-INR    EKG EKG Interpretation  Date/Time:  Wednesday December 18 2021 21:42:47 EST Ventricular Rate:  78 PR Interval:  219 QRS Duration: 104 QT Interval:  403 QTC Calculation: 459 R Axis:   50 Text Interpretation: Sinus rhythm Borderline prolonged PR interval Confirmed by Thamas Jaegers ([id number]) on 12/18/2021 10:49:18 PM  Radiology No results found.  Procedures .Critical Care Performed by: Luna Fuse, MD Authorized by: Luna Fuse, MD   Critical care provider statement:    Critical care time (minutes):  40   Critical care time was exclusive of:  Separately billable procedures and treating other patients and teaching time   Critical care was necessary to treat or prevent imminent or life-threatening deterioration of the following conditions:  Metabolic crisis    Medications Ordered in ED Medications  sodium zirconium cyclosilicate (LOKELMA) packet 10 g (has no administration in time range)  sodium chloride 0.9 % bolus 1,000 mL (has no administration in time range)  insulin aspart (novoLOG) injection 5 Units (has no administration in time range)    And  dextrose 50 % solution 50 mL (has no administration in time range)    ED Course/ Medical Decision Making/ A&P Clinical Course as of 12/18/21 2315  Wed Dec 18, 2021  2240 Potassium(!!): 7.2 [JH]    Clinical Course User Index [JH] Almyra Free Greggory Brandy, MD                           Medical  Decision Making Amount and/or Complexity of Data Reviewed Labs: ordered. Decision-making details documented in ED Course.  Risk OTC drugs. Prescription drug management.   Chart review shows recent admission and discharge December 15, 2021.  Labs are sent here today concerning for potassium 7.2 with no hemolysis detected.  Creatinine of 5.9 increased from baseline of around 2-3.  EKG showing peaked T waves.  Otherwise sinus rhythm, no ST elevations or depressions.  Foley catheter ordered.  Patient given a liter bolus of fluids and Lokelma ordered.  Insulin and D50 ordered as well.  Case discussed with nephrology Dr.Singh who will see the patient.  ICU consulted for admission.        Final Clinical Impression(s) / ED Diagnoses Final diagnoses:  Hyperkalemia  Acute renal failure superimposed on chronic kidney disease, unspecified CKD stage, unspecified acute renal failure type [facility])    Rx / DC Orders ED Discharge Orders     None         Luna Fuse, MD 12/18/21 2316

## 2021-12-18 NOTE — Progress Notes (Signed)
Informed of patient in ER. Presented with deconditioning, back pain. Found to have AKI on CKD with hyperkalemia. Has a history of AKI secondary to urinary retention along with right atrophic kidney/chronic hydronephrosis. Agree with shifting agents for hyperK and lokelma. Agree with foley. Recommend UA w/ microscopy, renal ultrasound to reassess for hydronephrosis. C/W fluids. Check serial K. Patient to be admitted. Full consult to follow in AM. Please call with questions/concerns. ? ?Gean Quint, MD ?[facility] ? ?

## 2021-12-18 NOTE — ED Triage Notes (Signed)
Just discharged on Monday. Now said that every time he moves there is a pain that shoots up his spine, his tremors are worse today and he feels that he needs to be seen sooner than was scheduled ?

## 2021-12-19 ENCOUNTER — Inpatient Hospital Stay (HOSPITAL_COMMUNITY): Payer: Medicare Other

## 2021-12-19 ENCOUNTER — Emergency Department (HOSPITAL_COMMUNITY): Payer: Medicare Other

## 2021-12-19 DIAGNOSIS — M109 Gout, unspecified: Secondary | ICD-10-CM | POA: Diagnosis present

## 2021-12-19 DIAGNOSIS — I161 Hypertensive emergency: Secondary | ICD-10-CM | POA: Diagnosis present

## 2021-12-19 DIAGNOSIS — L309 Dermatitis, unspecified: Secondary | ICD-10-CM | POA: Diagnosis present

## 2021-12-19 DIAGNOSIS — N179 Acute kidney failure, unspecified: Secondary | ICD-10-CM

## 2021-12-19 DIAGNOSIS — R310 Gross hematuria: Secondary | ICD-10-CM | POA: Diagnosis present

## 2021-12-19 DIAGNOSIS — E669 Obesity, unspecified: Secondary | ICD-10-CM | POA: Diagnosis present

## 2021-12-19 DIAGNOSIS — I872 Venous insufficiency (chronic) (peripheral): Secondary | ICD-10-CM | POA: Diagnosis present

## 2021-12-19 DIAGNOSIS — R9431 Abnormal electrocardiogram [ECG] [EKG]: Secondary | ICD-10-CM

## 2021-12-19 DIAGNOSIS — E1122 Type 2 diabetes mellitus with diabetic chronic kidney disease: Secondary | ICD-10-CM | POA: Diagnosis present

## 2021-12-19 DIAGNOSIS — Z20822 Contact with and (suspected) exposure to covid-19: Secondary | ICD-10-CM | POA: Diagnosis present

## 2021-12-19 DIAGNOSIS — E785 Hyperlipidemia, unspecified: Secondary | ICD-10-CM | POA: Diagnosis present

## 2021-12-19 DIAGNOSIS — N1330 Unspecified hydronephrosis: Secondary | ICD-10-CM | POA: Diagnosis present

## 2021-12-19 DIAGNOSIS — I160 Hypertensive urgency: Secondary | ICD-10-CM | POA: Diagnosis not present

## 2021-12-19 DIAGNOSIS — G894 Chronic pain syndrome: Secondary | ICD-10-CM | POA: Diagnosis present

## 2021-12-19 DIAGNOSIS — E872 Acidosis, unspecified: Secondary | ICD-10-CM | POA: Diagnosis present

## 2021-12-19 DIAGNOSIS — N261 Atrophy of kidney (terminal): Secondary | ICD-10-CM | POA: Diagnosis present

## 2021-12-19 DIAGNOSIS — Z95828 Presence of other vascular implants and grafts: Secondary | ICD-10-CM | POA: Diagnosis not present

## 2021-12-19 DIAGNOSIS — K219 Gastro-esophageal reflux disease without esophagitis: Secondary | ICD-10-CM | POA: Diagnosis present

## 2021-12-19 DIAGNOSIS — D631 Anemia in chronic kidney disease: Secondary | ICD-10-CM | POA: Diagnosis present

## 2021-12-19 DIAGNOSIS — N185 Chronic kidney disease, stage 5: Secondary | ICD-10-CM | POA: Diagnosis present

## 2021-12-19 DIAGNOSIS — I132 Hypertensive heart and chronic kidney disease with heart failure and with stage 5 chronic kidney disease, or end stage renal disease: Secondary | ICD-10-CM | POA: Diagnosis present

## 2021-12-19 DIAGNOSIS — I5042 Chronic combined systolic (congestive) and diastolic (congestive) heart failure: Secondary | ICD-10-CM | POA: Diagnosis present

## 2021-12-19 DIAGNOSIS — E1151 Type 2 diabetes mellitus with diabetic peripheral angiopathy without gangrene: Secondary | ICD-10-CM | POA: Diagnosis present

## 2021-12-19 DIAGNOSIS — N184 Chronic kidney disease, stage 4 (severe): Secondary | ICD-10-CM | POA: Diagnosis not present

## 2021-12-19 DIAGNOSIS — E875 Hyperkalemia: Secondary | ICD-10-CM | POA: Diagnosis present

## 2021-12-19 DIAGNOSIS — N400 Enlarged prostate without lower urinary tract symptoms: Secondary | ICD-10-CM | POA: Diagnosis present

## 2021-12-19 DIAGNOSIS — E039 Hypothyroidism, unspecified: Secondary | ICD-10-CM | POA: Diagnosis present

## 2021-12-19 LAB — CBC
HCT: 27.9 % — ABNORMAL LOW (ref 39.0–52.0)
Hemoglobin: 8.4 g/dL — ABNORMAL LOW (ref 13.0–17.0)
MCH: 31.8 pg (ref 26.0–34.0)
MCHC: 30.1 g/dL (ref 30.0–36.0)
MCV: 105.7 fL — ABNORMAL HIGH (ref 80.0–100.0)
Platelets: 177 10*3/uL (ref 150–400)
RBC: 2.64 MIL/uL — ABNORMAL LOW (ref 4.22–5.81)
RDW: 16.3 % — ABNORMAL HIGH (ref 11.5–15.5)
WBC: 7.6 10*3/uL (ref 4.0–10.5)
nRBC: 0 % (ref 0.0–0.2)

## 2021-12-19 LAB — MAGNESIUM
Magnesium: 2.4 mg/dL (ref 1.7–2.4)
Magnesium: 2.4 mg/dL (ref 1.7–2.4)

## 2021-12-19 LAB — BASIC METABOLIC PANEL
Anion gap: 10 (ref 5–15)
Anion gap: 9 (ref 5–15)
Anion gap: 11 (ref 5–15)
Anion gap: 8 (ref 5–15)
BUN: 79 mg/dL — ABNORMAL HIGH (ref 8–23)
BUN: 79 mg/dL — ABNORMAL HIGH (ref 8–23)
BUN: 78 mg/dL — ABNORMAL HIGH (ref 8–23)
BUN: 73 mg/dL — ABNORMAL HIGH (ref 8–23)
CO2: 20 mmol/L — ABNORMAL LOW (ref 22–32)
CO2: 21 mmol/L — ABNORMAL LOW (ref 22–32)
CO2: 19 mmol/L — ABNORMAL LOW (ref 22–32)
CO2: 23 mmol/L (ref 22–32)
Calcium: 7 mg/dL — ABNORMAL LOW (ref 8.9–10.3)
Calcium: 7 mg/dL — ABNORMAL LOW (ref 8.9–10.3)
Calcium: 6.8 mg/dL — ABNORMAL LOW (ref 8.9–10.3)
Calcium: 7.7 mg/dL — ABNORMAL LOW (ref 8.9–10.3)
Chloride: 102 mmol/L (ref 98–111)
Chloride: 103 mmol/L (ref 98–111)
Chloride: 104 mmol/L (ref 98–111)
Chloride: 106 mmol/L (ref 98–111)
Creatinine, Ser: 5.51 mg/dL — ABNORMAL HIGH (ref 0.61–1.24)
Creatinine, Ser: 5.55 mg/dL — ABNORMAL HIGH (ref 0.61–1.24)
Creatinine, Ser: 5.44 mg/dL — ABNORMAL HIGH (ref 0.61–1.24)
Creatinine, Ser: 5.55 mg/dL — ABNORMAL HIGH (ref 0.61–1.24)
GFR, Estimated: 10 mL/min — ABNORMAL LOW (ref >60)
GFR, Estimated: 11 mL/min — ABNORMAL LOW (ref >60)
GFR, Estimated: 11 mL/min — ABNORMAL LOW (ref >60)
GFR, Estimated: 10 mL/min — ABNORMAL LOW (ref >60)
Glucose, Bld: 135 mg/dL — ABNORMAL HIGH (ref 70–99)
Glucose, Bld: 136 mg/dL — ABNORMAL HIGH (ref 70–99)
Glucose, Bld: 113 mg/dL — ABNORMAL HIGH (ref 70–99)
Glucose, Bld: 120 mg/dL — ABNORMAL HIGH (ref 70–99)
Potassium: 6.1 mmol/L — ABNORMAL HIGH (ref 3.5–5.1)
Potassium: 6.1 mmol/L — ABNORMAL HIGH (ref 3.5–5.1)
Potassium: 6.7 mmol/L (ref 3.5–5.1)
Potassium: 5.5 mmol/L — ABNORMAL HIGH (ref 3.5–5.1)
Sodium: 132 mmol/L — ABNORMAL LOW (ref 135–145)
Sodium: 133 mmol/L — ABNORMAL LOW (ref 135–145)
Sodium: 134 mmol/L — ABNORMAL LOW (ref 135–145)
Sodium: 137 mmol/L (ref 135–145)

## 2021-12-19 LAB — GLUCOSE, CAPILLARY
Glucose-Capillary: 107 mg/dL — ABNORMAL HIGH (ref 70–99)
Glucose-Capillary: 117 mg/dL — ABNORMAL HIGH (ref 70–99)
Glucose-Capillary: 107 mg/dL — ABNORMAL HIGH (ref 70–99)
Glucose-Capillary: 102 mg/dL — ABNORMAL HIGH (ref 70–99)
Glucose-Capillary: 124 mg/dL — ABNORMAL HIGH (ref 70–99)

## 2021-12-19 LAB — URINALYSIS, ROUTINE W REFLEX MICROSCOPIC
Bacteria, UA: NONE SEEN
Bilirubin Urine: NEGATIVE
Glucose, UA: NEGATIVE mg/dL
Ketones, ur: NEGATIVE mg/dL
Nitrite: NEGATIVE
Protein, ur: >=300 mg/dL — AB
Specific Gravity, Urine: 1.011 (ref 1.005–1.030)
WBC, UA: >50 WBC/hpf — ABNORMAL HIGH (ref 0–5)
pH: 6 (ref 5.0–8.0)

## 2021-12-19 LAB — CK: Total CK: 558 U/L — ABNORMAL HIGH (ref 49–397)

## 2021-12-19 LAB — ECHOCARDIOGRAM COMPLETE
Area-P 1/2: 3.12 cm2
Calc EF: 62 %
Height: 77 in
S' Lateral: 3.9 cm
Single Plane A2C EF: 67.4 %
Single Plane A4C EF: 55 %
Weight: 4264.58 oz

## 2021-12-19 LAB — PROTIME-INR
INR: 2.7 — ABNORMAL HIGH (ref 0.8–1.2)
INR: 2.6 — ABNORMAL HIGH (ref 0.8–1.2)
INR: 2.6 — ABNORMAL HIGH (ref 0.8–1.2)
Prothrombin Time: 28.2 seconds — ABNORMAL HIGH (ref 11.4–15.2)
Prothrombin Time: 27.5 seconds — ABNORMAL HIGH (ref 11.4–15.2)
Prothrombin Time: 27.7 seconds — ABNORMAL HIGH (ref 11.4–15.2)

## 2021-12-19 LAB — HEPATIC FUNCTION PANEL
ALT: 9 U/L (ref 0–44)
AST: 23 U/L (ref 15–41)
Albumin: 3.1 g/dL — ABNORMAL LOW (ref 3.5–5.0)
Alkaline Phosphatase: 69 U/L (ref 38–126)
Bilirubin, Direct: <0.1 mg/dL (ref 0.0–0.2)
Total Bilirubin: 0.1 mg/dL — ABNORMAL LOW (ref 0.3–1.2)
Total Protein: 7.3 g/dL (ref 6.5–8.1)

## 2021-12-19 LAB — HEMOGLOBIN A1C
Hgb A1c MFr Bld: 4.8 % (ref 4.8–5.6)
Mean Plasma Glucose: 91.06 mg/dL

## 2021-12-19 LAB — RENAL FUNCTION PANEL
Albumin: 3.1 g/dL — ABNORMAL LOW (ref 3.5–5.0)
Anion gap: 11 (ref 5–15)
BUN: 75 mg/dL — ABNORMAL HIGH (ref 8–23)
CO2: 20 mmol/L — ABNORMAL LOW (ref 22–32)
Calcium: 7 mg/dL — ABNORMAL LOW (ref 8.9–10.3)
Chloride: 103 mmol/L (ref 98–111)
Creatinine, Ser: 5.96 mg/dL — ABNORMAL HIGH (ref 0.61–1.24)
GFR, Estimated: 10 mL/min — ABNORMAL LOW (ref >60)
Glucose, Bld: 79 mg/dL (ref 70–99)
Phosphorus: 9 mg/dL — ABNORMAL HIGH (ref 2.5–4.6)
Potassium: 5.5 mmol/L — ABNORMAL HIGH (ref 3.5–5.1)
Sodium: 134 mmol/L — ABNORMAL LOW (ref 135–145)

## 2021-12-19 LAB — PHOSPHORUS: Phosphorus: 9.5 mg/dL — ABNORMAL HIGH (ref 2.5–4.6)

## 2021-12-19 LAB — TSH: TSH: 5.805 u[IU]/mL — ABNORMAL HIGH (ref 0.350–4.500)

## 2021-12-19 LAB — CBG MONITORING, ED
Glucose-Capillary: 112 mg/dL — ABNORMAL HIGH (ref 70–99)
Glucose-Capillary: 76 mg/dL (ref 70–99)
Glucose-Capillary: 88 mg/dL (ref 70–99)

## 2021-12-19 LAB — MRSA NEXT GEN BY PCR, NASAL: MRSA by PCR Next Gen: DETECTED — AB

## 2021-12-19 LAB — POTASSIUM
Potassium: 6.2 mmol/L — ABNORMAL HIGH (ref 3.5–5.1)
Potassium: 6.2 mmol/L — ABNORMAL HIGH (ref 3.5–5.1)
Potassium: 5.4 mmol/L — ABNORMAL HIGH (ref 3.5–5.1)

## 2021-12-19 IMAGING — US US RENAL
1 series · 15 of 25 positions shown · non-contrast
Comparison: [DATE]

CLINICAL DATA: Acute kidney injury

EXAM:
RENAL / URINARY TRACT ULTRASOUND COMPLETE

[Series 1: us renal mc & wl · 15 of 43 slices shown]
[im 1/43]
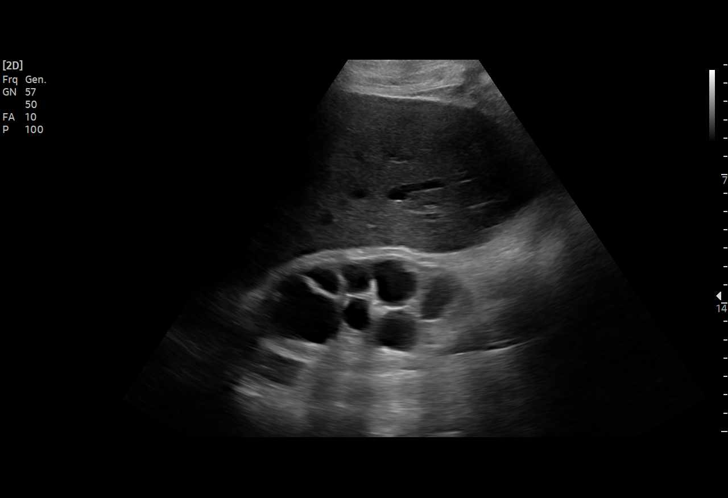
[im 4/43]
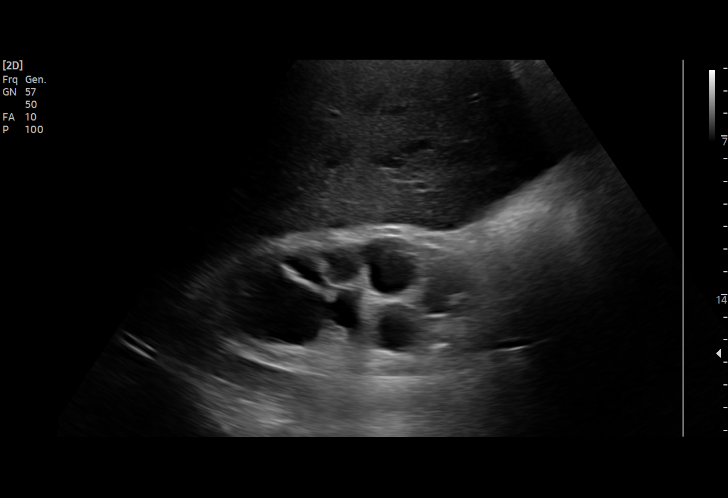
[im 8/43]
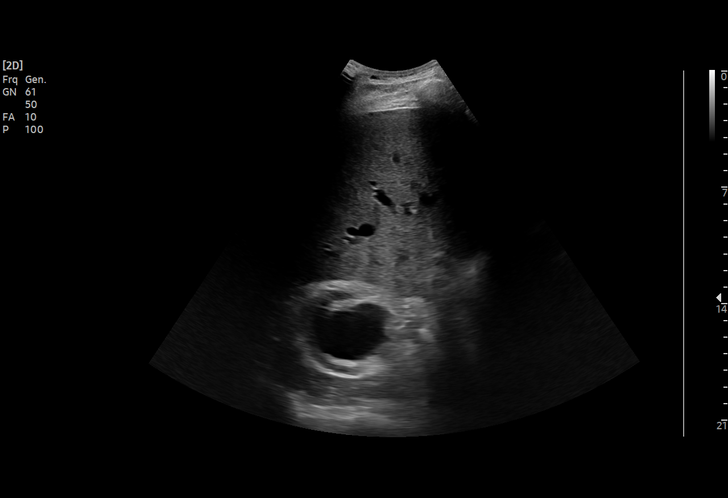
[im 9/43]
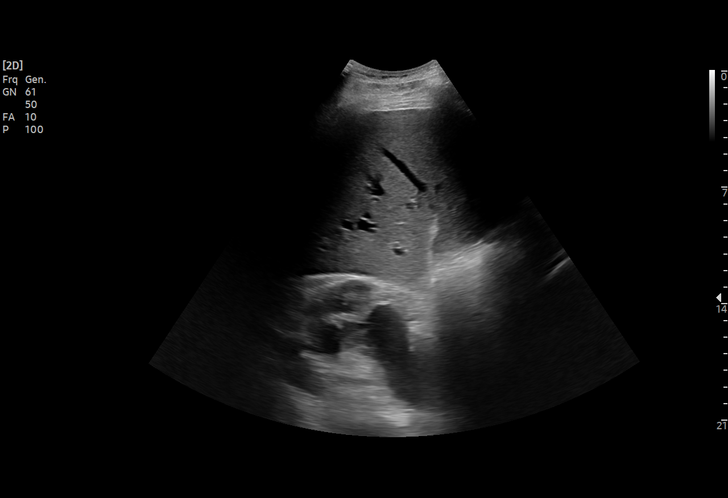
[im 13/43]
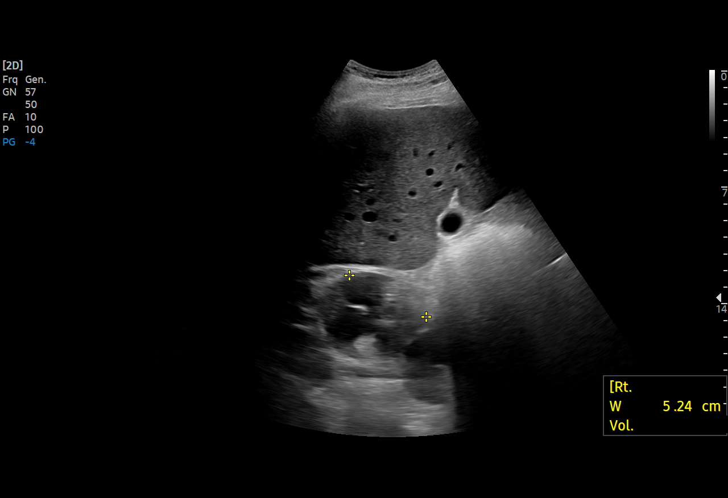
[im 16/43]
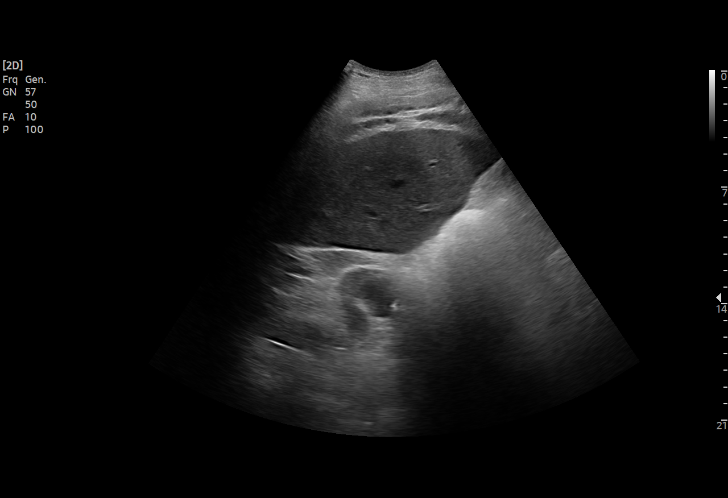
[im 18/43]
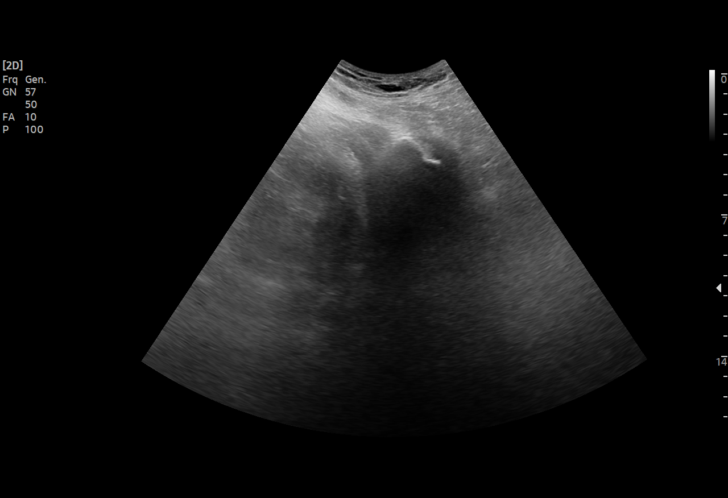
[im 22/43]
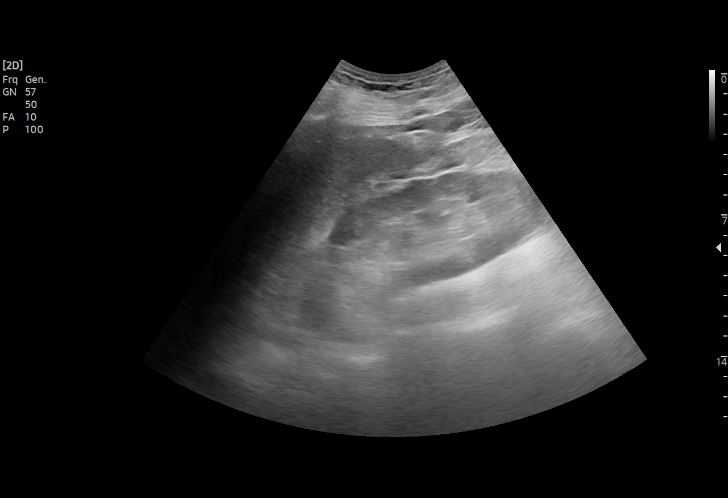
[im 25/43]
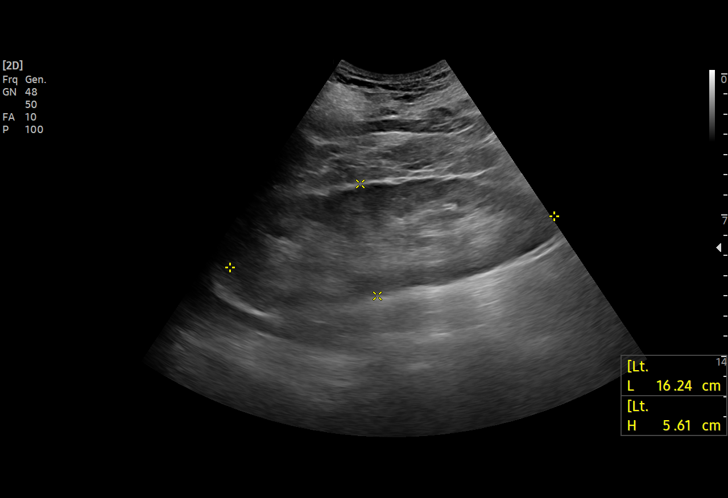
[im 27/43]
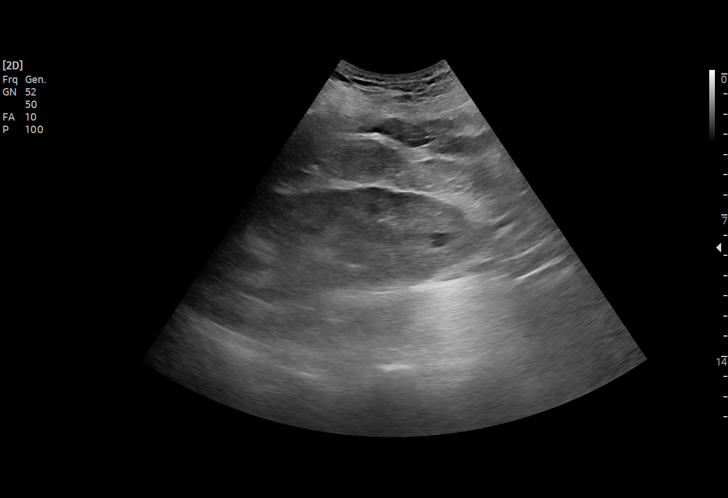
[im 30/43]
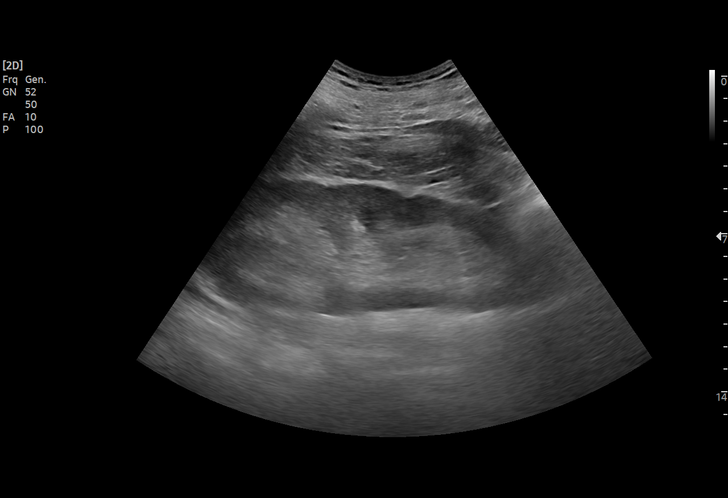
[im 34/43]
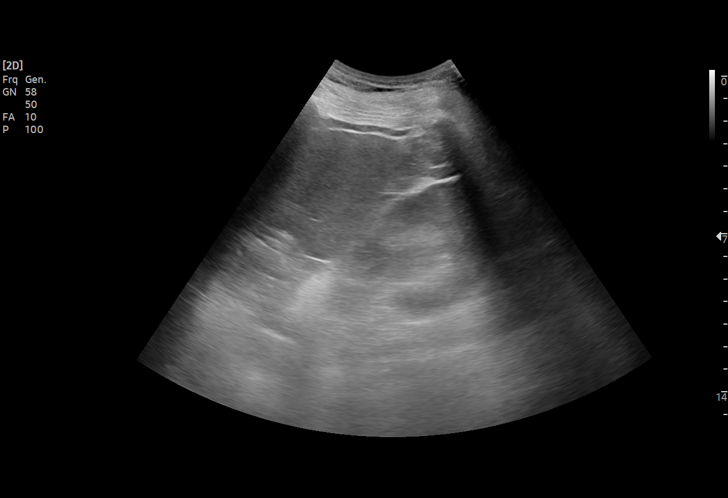
[im 36/43]
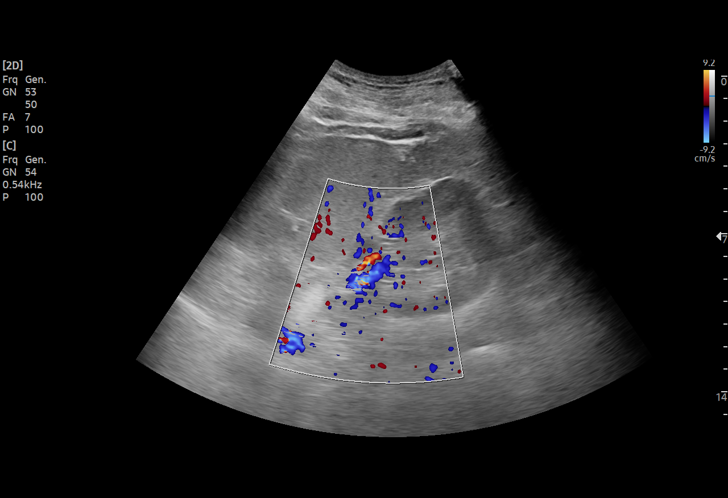
[im 39/43]
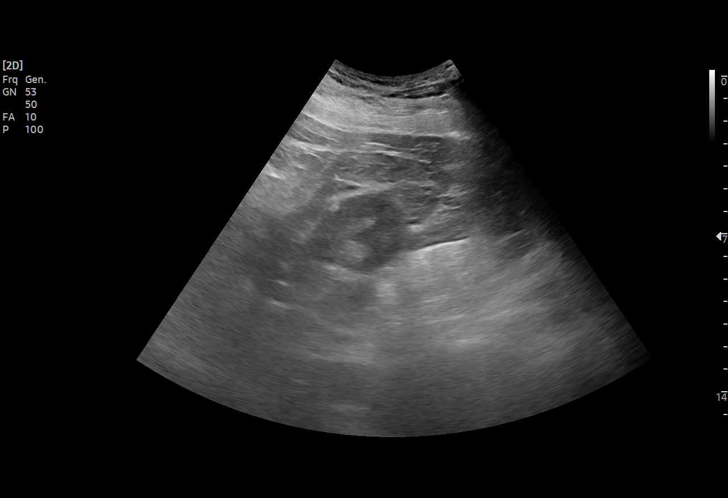
[im 43/43]
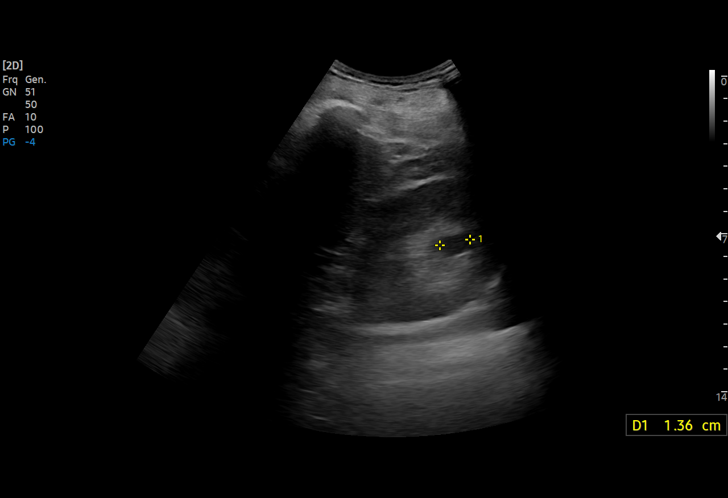

[15 of 25 positions shown; findings below may reference images not displayed]

FINDINGS: Right Kidney:

Renal measurements: 12.4 x 5.5 x 5.2 cm = volume: 188 mL. Severe
right hydronephrosis with overlying cortical thinning. No change
since prior study.

Left Kidney:

Renal measurements: 16.2 x 5.6 x 5.1 cm = volume: 2.4 mL. No
hydronephrosis. Normal echotexture. Small cysts measuring up to
cm.

Bladder:

Decompressed, not visualized.

Other:

None.
IMPRESSION: Severe chronic right hydronephrosis with overlying cortical
thinning. Findings stable since prior study.

## 2021-12-19 IMAGING — CT CT HEAD W/O CM
4 series · 16 of 47 positions shown, 18 images · non-contrast
Comparison: Head CT [DATE] and earlier.

CLINICAL DATA: 70-year-old male with altered mental status.



[Series 2: head wo · axial · 0.47mm/px · z∈[-84,+36]mm · 7 of 32 slices shown, 9 images]
[im 4/32  brain]
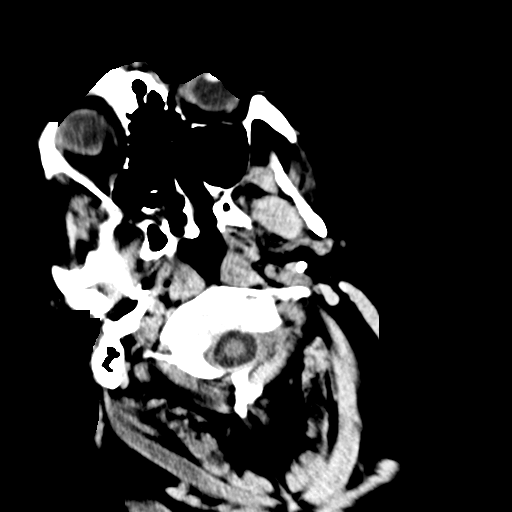
[im 4/32  bone]
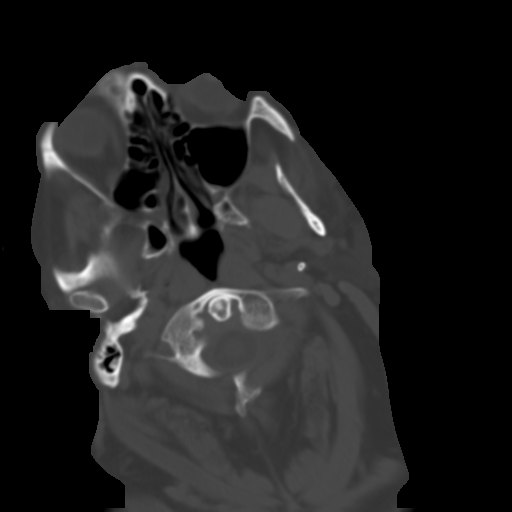
[im 8/32  brain]
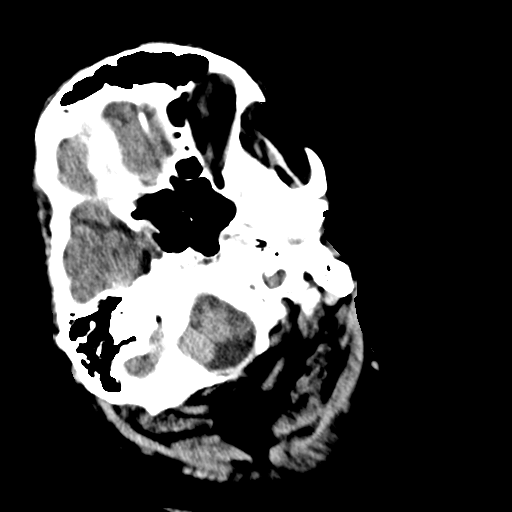
[im 12/32  brain]
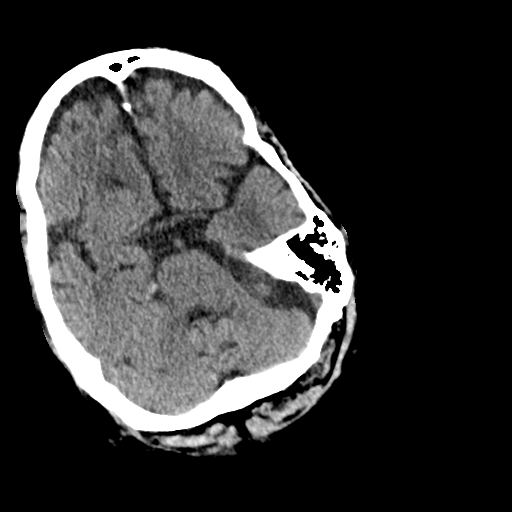
[im 16/32  brain]
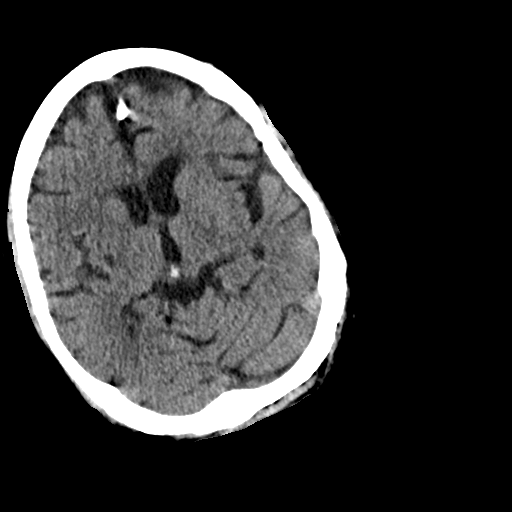
[im 20/32  brain]
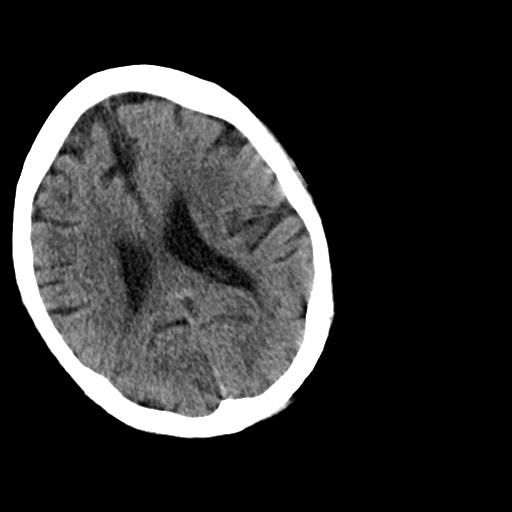
[im 20/32  bone]
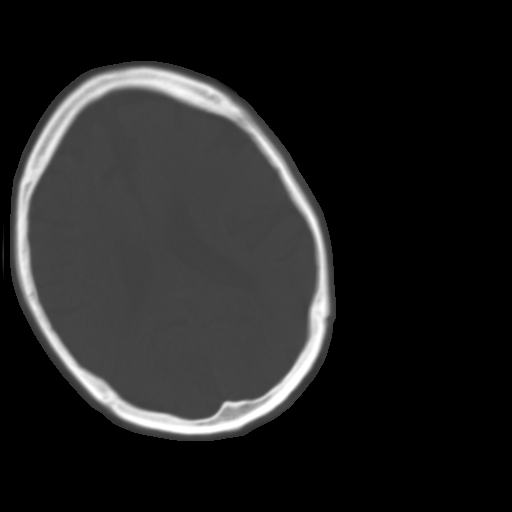
[im 24/32  brain]
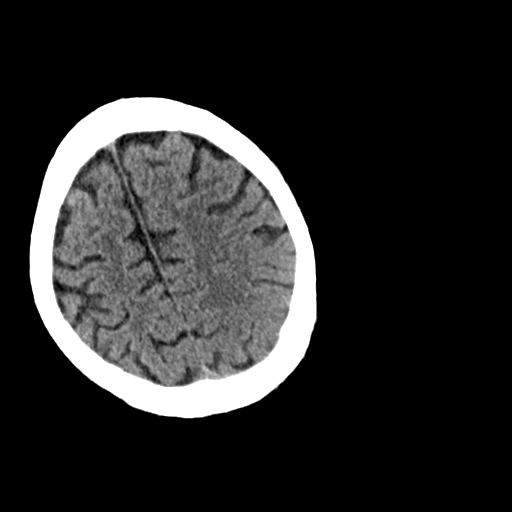
[im 28/32  brain]
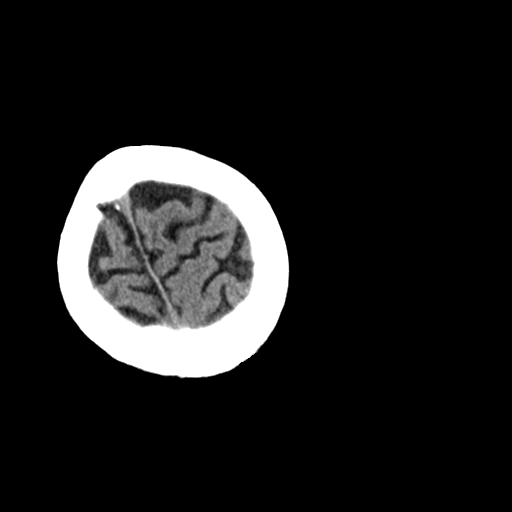

[Series 3: head bone · axial · 0.47mm/px · z∈[-84,-52]mm · 3 of 79 slices shown]
[im 8/79  bone]
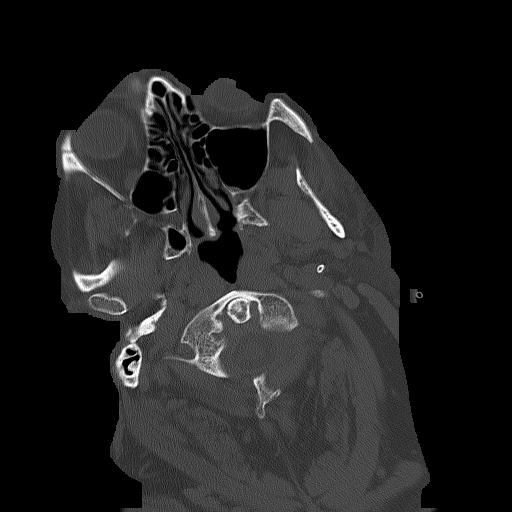
[im 16/79  bone]
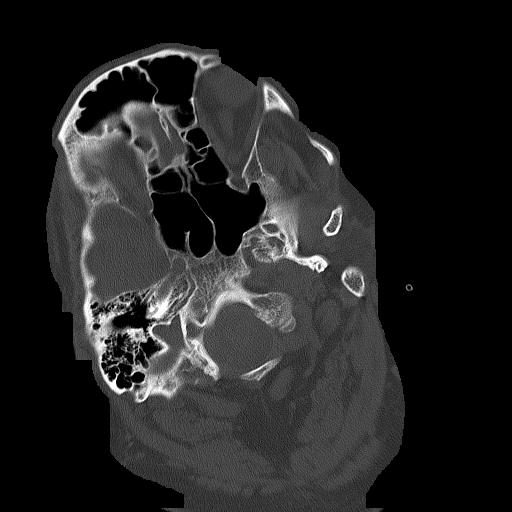
[im 24/79  bone]
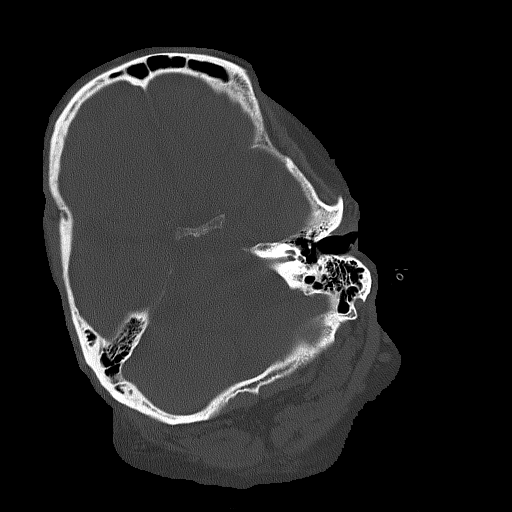

[Series 4: coronal soft tissue · coronal · 0.29mm/px · 3 of 70 slices shown]
[im 24/70  brain]
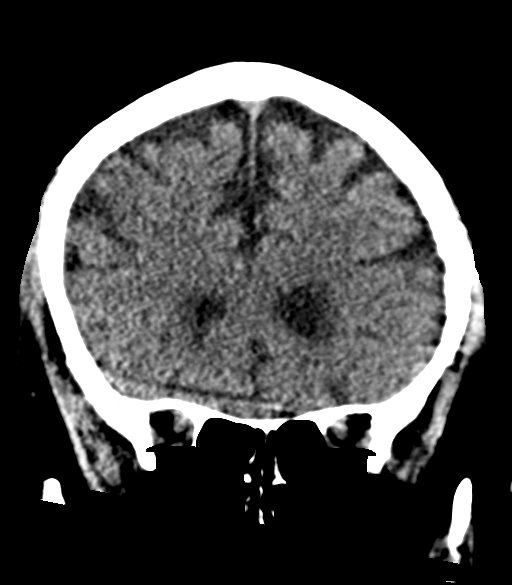
[im 31/70  brain]
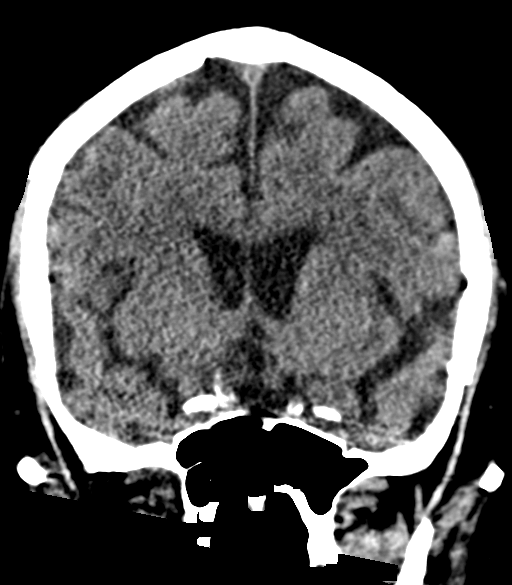
[im 39/70  brain]
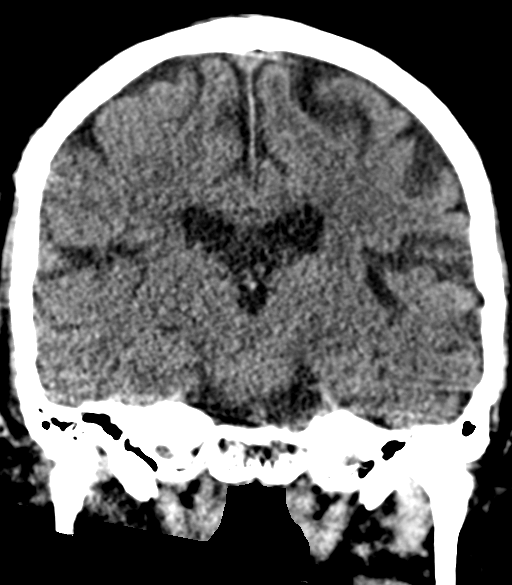

[Series 5: sagittal soft tissue · sagittal · 0.32mm/px · 3 of 52 slices shown]
[im 22/52  brain]
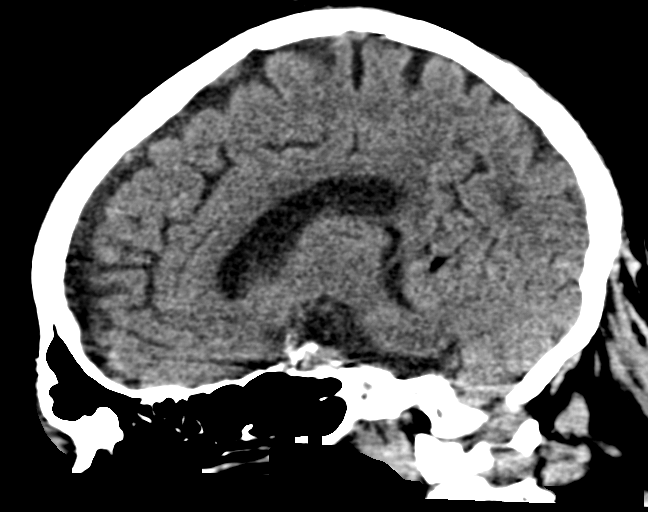
[im 26/52  brain]
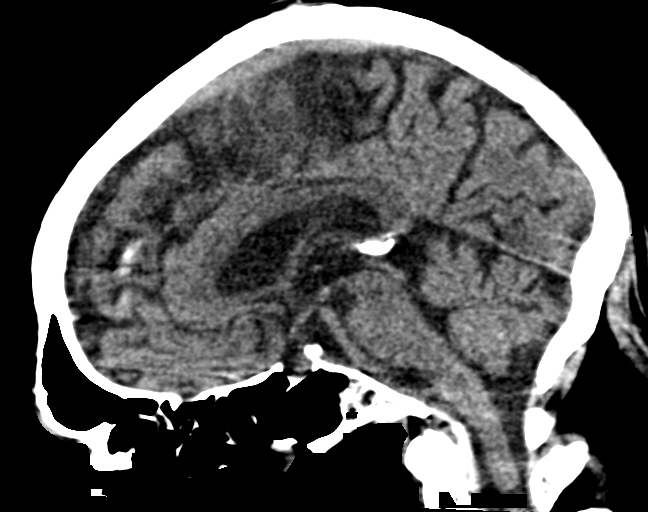
[im 30/52  brain]
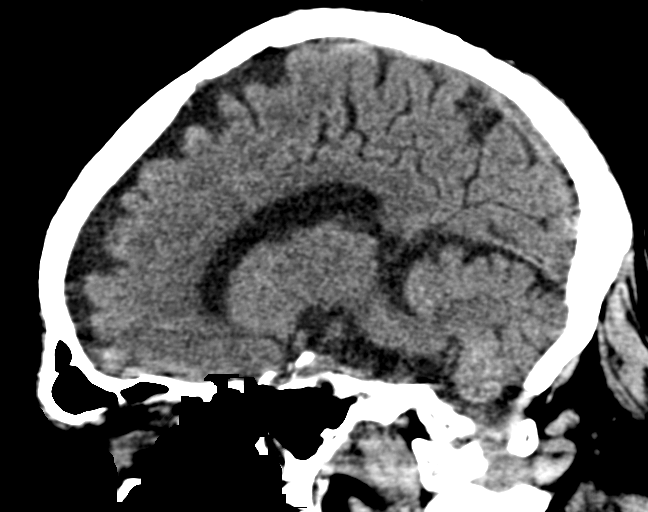

[16 of 47 positions shown; findings below may reference images not displayed]

FINDINGS: Brain: Stable cerebral volume. No midline shift, ventriculomegaly,
mass effect, evidence of mass lesion, intracranial hemorrhage or
evidence of cortically based acute infarction. Small chronic lacunar
infarcts suspected in the right cerebellum and stable from last
year. Elsewhere gray-white matter differentiation appears normal for
age. No cortical encephalomalacia identified.

Vascular: Calcified atherosclerosis at the skull base. No suspicious
intracranial vascular hyperdensity.

Skull: No acute osseous abnormality identified.

Sinuses/Orbits: Visualized paranasal sinuses and mastoids are stable
and well aerated.

Other: Visualized scalp soft tissues are within normal limits.
Slight leftward gaze, otherwise negative visible orbits.
IMPRESSION: No acute intracranial abnormality. Stable mild for age chronic small
vessel disease.

## 2021-12-19 MED ORDER — INSULIN ASPART 100 UNIT/ML IV SOLN
5.0000 [IU] | Freq: Once | INTRAVENOUS | Status: AC
  Administered 2021-12-19: 5 [IU] via INTRAVENOUS

## 2021-12-19 MED ORDER — FUROSEMIDE 10 MG/ML IJ SOLN
60.0000 mg | Freq: Once | INTRAMUSCULAR | Status: DC
  Filled 2021-12-19: qty 6

## 2021-12-19 MED ORDER — WARFARIN SODIUM 5 MG PO TABS
7.5000 mg | ORAL_TABLET | Freq: Once | ORAL | Status: AC
  Administered 2021-12-19: 7.5 mg via ORAL
  Filled 2021-12-19: qty 1

## 2021-12-19 MED ORDER — SODIUM ZIRCONIUM CYCLOSILICATE 10 G PO PACK
10.0000 g | PACK | Freq: Once | ORAL | Status: DC
Start: 2021-12-19 — End: 2021-12-19

## 2021-12-19 MED ORDER — WARFARIN - PHARMACIST DOSING INPATIENT: Freq: Every day | Status: DC

## 2021-12-19 MED ORDER — MUPIROCIN 2 % EX OINT
1.0000 "application " | TOPICAL_OINTMENT | Freq: Two times a day (BID) | CUTANEOUS | Status: AC
  Administered 2021-12-19 – 2021-12-23 (×10): 1 via NASAL
  Filled 2021-12-19 (×3): qty 22

## 2021-12-19 MED ORDER — DOCUSATE SODIUM 100 MG PO CAPS
100.0000 mg | ORAL_CAPSULE | Freq: Two times a day (BID) | ORAL | Status: DC | PRN
  Administered 2021-12-19: 100 mg via ORAL
  Filled 2021-12-19: qty 1

## 2021-12-19 MED ORDER — CHLORHEXIDINE GLUCONATE CLOTH 2 % EX PADS
6.0000 | MEDICATED_PAD | Freq: Every day | CUTANEOUS | Status: AC
  Administered 2021-12-19 – 2021-12-22 (×4): 6 via TOPICAL

## 2021-12-19 MED ORDER — PANTOPRAZOLE SODIUM 40 MG PO TBEC
40.0000 mg | DELAYED_RELEASE_TABLET | Freq: Two times a day (BID) | ORAL | Status: DC
  Administered 2021-12-19 – 2021-12-24 (×11): 40 mg via ORAL
  Filled 2021-12-19 (×11): qty 1

## 2021-12-19 MED ORDER — FUROSEMIDE 10 MG/ML IJ SOLN
80.0000 mg | Freq: Two times a day (BID) | INTRAMUSCULAR | Status: AC
  Administered 2021-12-19 – 2021-12-20 (×2): 80 mg via INTRAVENOUS
  Filled 2021-12-19 (×2): qty 8

## 2021-12-19 MED ORDER — NALOXONE HCL 0.4 MG/ML IJ SOLN
0.4000 mg | INTRAMUSCULAR | Status: DC | PRN
  Administered 2021-12-19: 0.4 mg via INTRAVENOUS

## 2021-12-19 MED ORDER — ONDANSETRON HCL 4 MG/2ML IJ SOLN
4.0000 mg | Freq: Once | INTRAMUSCULAR | Status: AC
  Administered 2021-12-19: 4 mg via INTRAVENOUS

## 2021-12-19 MED ORDER — CHLORHEXIDINE GLUCONATE CLOTH 2 % EX PADS
6.0000 | MEDICATED_PAD | Freq: Every day | CUTANEOUS | Status: DC
  Administered 2021-12-19 – 2021-12-24 (×3): 6 via TOPICAL

## 2021-12-19 MED ORDER — LEVOTHYROXINE SODIUM 50 MCG PO TABS
50.0000 ug | ORAL_TABLET | Freq: Every day | ORAL | Status: DC
  Administered 2021-12-20 – 2021-12-24 (×5): 50 ug via ORAL
  Filled 2021-12-19 (×5): qty 1

## 2021-12-19 MED ORDER — FUROSEMIDE 10 MG/ML IJ SOLN
80.0000 mg | Freq: Once | INTRAMUSCULAR | Status: AC
  Administered 2021-12-19: 80 mg via INTRAVENOUS
  Filled 2021-12-19: qty 8

## 2021-12-19 MED ORDER — FUROSEMIDE 10 MG/ML IJ SOLN: 40.0000 mg | Freq: Once | INTRAMUSCULAR | Status: DC

## 2021-12-19 MED ORDER — DEXTROSE 50 % IV SOLN: 1.0000 | Freq: Once | INTRAVENOUS | Status: DC

## 2021-12-19 MED ORDER — SODIUM ZIRCONIUM CYCLOSILICATE 10 G PO PACK
10.0000 g | PACK | Freq: Three times a day (TID) | ORAL | Status: DC
  Administered 2021-12-19: 10 g via ORAL
  Filled 2021-12-19: qty 1

## 2021-12-19 MED ORDER — HYDRALAZINE HCL 50 MG PO TABS: 50.0000 mg | ORAL_TABLET | Freq: Three times a day (TID) | ORAL | Status: DC | PRN

## 2021-12-19 MED ORDER — SODIUM BICARBONATE 8.4 % IV SOLN
50.0000 meq | Freq: Once | INTRAVENOUS | Status: AC
  Administered 2021-12-19: 50 meq via INTRAVENOUS
  Filled 2021-12-19: qty 50

## 2021-12-19 MED ORDER — INSULIN ASPART 100 UNIT/ML IV SOLN: 5.0000 [IU] | Freq: Once | INTRAVENOUS | Status: DC

## 2021-12-19 MED ORDER — NALOXONE HCL 0.4 MG/ML IJ SOLN
INTRAMUSCULAR | Status: AC
  Filled 2021-12-19: qty 1

## 2021-12-19 MED ORDER — FINASTERIDE 5 MG PO TABS
5.0000 mg | ORAL_TABLET | Freq: Every day | ORAL | Status: DC
  Administered 2021-12-19 – 2021-12-24 (×6): 5 mg via ORAL
  Filled 2021-12-19 (×6): qty 1

## 2021-12-19 MED ORDER — AMLODIPINE BESYLATE 5 MG PO TABS
5.0000 mg | ORAL_TABLET | Freq: Every day | ORAL | Status: DC
  Administered 2021-12-19 – 2021-12-24 (×6): 5 mg via ORAL
  Filled 2021-12-19 (×6): qty 1

## 2021-12-19 MED ORDER — CHLORHEXIDINE GLUCONATE 0.12 % MT SOLN
15.0000 mL | Freq: Two times a day (BID) | OROMUCOSAL | Status: DC
  Administered 2021-12-19 – 2021-12-24 (×11): 15 mL via OROMUCOSAL
  Filled 2021-12-19 (×11): qty 15

## 2021-12-19 MED ORDER — ONDANSETRON HCL 4 MG/2ML IJ SOLN
INTRAMUSCULAR | Status: AC
  Filled 2021-12-19: qty 2

## 2021-12-19 MED ORDER — SODIUM CHLORIDE 0.9 % IV SOLN
1.0000 g | Freq: Once | INTRAVENOUS | Status: AC
  Administered 2021-12-19: 1 g via INTRAVENOUS
  Filled 2021-12-19: qty 10

## 2021-12-19 MED ORDER — SODIUM ZIRCONIUM CYCLOSILICATE 10 G PO PACK: 10.0000 g | PACK | Freq: Three times a day (TID) | ORAL | Status: DC

## 2021-12-19 MED ORDER — POLYETHYLENE GLYCOL 3350 17 G PO PACK: 17.0000 g | PACK | Freq: Every day | ORAL | Status: DC | PRN

## 2021-12-19 MED ORDER — TAMSULOSIN HCL 0.4 MG PO CAPS
0.4000 mg | ORAL_CAPSULE | Freq: Every day | ORAL | Status: DC
  Administered 2021-12-19 – 2021-12-24 (×6): 0.4 mg via ORAL
  Filled 2021-12-19 (×6): qty 1

## 2021-12-19 MED ORDER — OXYCODONE HCL ER 10 MG PO T12A
30.0000 mg | EXTENDED_RELEASE_TABLET | Freq: Two times a day (BID) | ORAL | Status: DC
  Administered 2021-12-19: 30 mg via ORAL
  Filled 2021-12-19: qty 3

## 2021-12-19 MED ORDER — GABAPENTIN 100 MG PO CAPS
100.0000 mg | ORAL_CAPSULE | Freq: Three times a day (TID) | ORAL | Status: DC
  Administered 2021-12-19 – 2021-12-24 (×16): 100 mg via ORAL
  Filled 2021-12-19 (×16): qty 1

## 2021-12-19 MED ORDER — CLONIDINE HCL 0.1 MG PO TABS
0.1000 mg | ORAL_TABLET | Freq: Three times a day (TID) | ORAL | Status: DC
  Administered 2021-12-19 – 2021-12-21 (×6): 0.1 mg via ORAL
  Filled 2021-12-19 (×6): qty 1

## 2021-12-19 MED ORDER — SODIUM ZIRCONIUM CYCLOSILICATE 10 G PO PACK
10.0000 g | PACK | Freq: Three times a day (TID) | ORAL | Status: DC
  Administered 2021-12-19 – 2021-12-20 (×5): 10 g via ORAL
  Filled 2021-12-19 (×6): qty 1

## 2021-12-19 MED ORDER — ORAL CARE MOUTH RINSE
15.0000 mL | Freq: Two times a day (BID) | OROMUCOSAL | Status: DC
  Administered 2021-12-19 – 2021-12-22 (×5): 15 mL via OROMUCOSAL

## 2021-12-19 MED ORDER — GABAPENTIN 300 MG PO CAPS: 300.0000 mg | ORAL_CAPSULE | Freq: Every day | ORAL | Status: DC

## 2021-12-19 MED ORDER — OXYCODONE HCL 5 MG PO TABS
5.0000 mg | ORAL_TABLET | Freq: Four times a day (QID) | ORAL | Status: DC | PRN
  Administered 2021-12-19 (×2): 5 mg via ORAL
  Filled 2021-12-19 (×3): qty 1

## 2021-12-19 MED ORDER — DEXTROSE 50 % IV SOLN
1.0000 | Freq: Once | INTRAVENOUS | Status: AC
  Administered 2021-12-19: 50 mL via INTRAVENOUS
  Filled 2021-12-19: qty 50

## 2021-12-19 NOTE — Progress Notes (Signed)
ANTICOAGULATION CONSULT NOTE - Initial Consult ? ?Pharmacy Consult for Warfarin ?Indication: PE/chronic DVT (possible Eliquis failure) ? ?Allergies  ?Allergen Reactions  ? Other Other (See Comments)  ?  Blood pressure issues ?Per [facility] hospital: Pt states he can only take these pain meds or else he gets very sick: Oxycontin,morphine,demerol, and dilaudid are the only pain meds pt states he can take.  ?  ? Oxymorphone Other (See Comments)  ?  Causes kidney problems  ? Aspirin Nausea And Vomiting  ?  Per [facility]  ? Beta Vulgaris Nausea And Vomiting  ?  Beets  ? Buspirone Nausea And Vomiting and Other (See Comments)  ?  Affected his head  ? Cabbage Nausea And Vomiting  ? Codeine Other (See Comments)  ?  Unknown reaction  ? Dolobid [Diflunisal] Other (See Comments)  ?  Unknown reaction  ? Fish Allergy Nausea And Vomiting  ? Fish-Derived Products Nausea And Vomiting  ? Methadone Other (See Comments)  ?  Made him loopy  ? Pentazocine Other (See Comments)  ?  Unknown reaction  ? Propoxyphene Nausea And Vomiting and Other (See Comments)  ?  Messed with his head  ? Shellfish Allergy Nausea And Vomiting  ? Sulfa Antibiotics Hives  ? Sulfasalazine Other (See Comments)  ?  Unknown reaction  ? Vistaril [Hydroxyzine] Nausea And Vomiting and Other (See Comments)  ?  Didn't help  ? Amoxicillin Rash  ?  Per [facility]  ? ? ?Patient Measurements: ?Height: 6\' 5"  (195.6 cm) ?Weight: 121.7 kg (268 lb 4.8 oz) ?IBW/kg (Calculated) : 89.1 ?  ? ?Vital Signs: ?Temp: 98.2 ?F (36.8 ?C) (03/01 2118) ?Temp Source: Oral (03/01 2118) ?BP: 106/48 (03/02 0245) ?Pulse Rate: 74 (03/02 0245) ? ?Labs: ?Recent Labs  ?  12/16/21 ?6415 12/18/21 ?2155 12/19/21 ?0151  ?HGB  --  9.3*  --   ?HCT  --  31.0*  --   ?PLT  --  186  --   ?LABPROT 25.1*  --  28.2*  ?INR 2.3*  --  2.7*  ?CREATININE  --  5.95* 5.96*  ? ? ?Estimated Creatinine Clearance: 17.1 mL/min (A) (by C-G formula based on SCr of 5.96 mg/dL (H)). ? ? ?Medical History: ?Past  Medical History:  ?Diagnosis Date  ? Arthritis   ? BPH (benign prostatic hyperplasia) 11/24/2019  ? Chronic pain syndrome 11/24/2019  ? CKD (chronic kidney disease), stage IV ([place]) 11/24/2019  ? Diabetes mellitus without complication ([place])   ? Gout   ? Hypertension   ? Muscle pain   ? Physical deconditioning 11/24/2019  ? Scars   ? Swelling   ? ? ?Medications:  ?PTA Warfarin ? ?Assessment: ?71 yr male admitted with worsening renal function and c/o worsening pain ?Recent hospitalization for LLE cellulitis and worsening DVT (discharged 12/16/21) with instructions to take warfarin 7.5mg  daily x 3 days and then to have INR checked as an outpatient ?Patient reports last warfarin taken on 12/17/21 ?INR on admission (3/2) = 2.7 ? ?Goal of Therapy:  ?INR 2-3 ?Monitor platelets by anticoagulation protocol: Yes ?  ?Plan:  ?Warfarin 7.5mg  po x 1 today ?Daily PT/INR ? ?Alaysiah Browder, Toribio Harbour, PharmD ?12/19/2021,2:56 AM ? ? ?

## 2021-12-19 NOTE — Progress Notes (Signed)
? ?NAME:  Thomas [name], MRN:  [id number], DOB:  Feb 06, 1951, LOS: 0 ?ADMISSION DATE:  12/18/2021, CONSULTATION DATE:  12/19/2021 ?REFERRING MD: Thamas Jaegers, CHIEF COMPLAINT:  worsening pain  ? ?History of Present Illness:  ?Pt is a 71 yo male with complex PMHx including DM2, PAD, DVT, pulm embolus, & CKD IV presenting with complaints of generalized unwell feeling & worsening baseline tremors. He was discharged on 2/27 following admission for  LLE cellulitis, propagation of DVT in setting of eliquis failure. He was transitioned to warfarin with follow up planned for this week. In ED he was found to have worsening renal function and severe hyperkalemia of 7.2. He had slightly peaked T waves and ICU called for admission. Nephrology consulted in ED, no plans for emergent/urgent HD.  He reports gradually worsening over last ew days. Taking meds as RX. No OTC meds or supplements. Says appetite is okay but doesn't get much down when in so much pain. Says pain is "everywhere from neck down."    ? ?Pertinent  Medical History  ?  Left leg cellulitis ?  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  SVT (supraventricular tachycardia) ([place]) ?  [name], right ([place]) ?  [name], type 2) ([place]) ?  Gout ?  HTN (hypertension) ?  HTN Emergency /possible PRES  ?  Chronic combined systolic and diastolic CHF (congestive heart failure) ([place]) ?  GERD (gastroesophageal reflux disease) ?  BPH (benign prostatic hyperplasia) ?  CKD (chronic kidney disease), stage IV ([place]) ?  Chronic pain syndrome ?  Physical deconditioning ?  Hypothyroidism ?  Obesity, Class I, BMI 30-34.9 ? ?Significant Hospital Events: ?Including procedures, antibiotic start and stop dates in addition to other pertinent events   ?2/27 discharged home (admitted 2/18-2/26)  ? ?Interim History / Subjective:  ?Received temporizing measures x 2 overnight.  K currently 6.7 from labs at 0700 which was drawn BEFORE last round of meds  given. ?Nephrology full consult pending. ? ?Objective   ?Blood pressure 135/60, pulse 66, temperature 97.9 ?F (36.6 ?C), temperature source Oral, resp. rate 15, height 6\' 5"  (1.956 m), weight 120.9 kg, SpO2 98 %. ?   ?   ? ?Intake/Output Summary (Last 24 hours) at 12/19/2021 0834 ?Last data filed at 12/19/2021 0400 ?Gross per 24 hour  ?Intake --  ?Output 1000 ml  ?Net -1000 ml  ? ?Filed Weights  ? 12/18/21 2120 12/19/21 0500  ?Weight: 121.7 kg 120.9 kg  ? ? ?Examination: ?General: Adult male, resting in bed, in NAD. ?Neuro: A&O x 3, no deficits. ?HEENT: [place]/AT. Sclerae anicteric. EOMI. ?Cardiovascular: RRR, no M/R/G.  ?Lungs: Respirations even and unlabored.  CTA bilaterally, No W/R/R.  ?Abdomen: Obese. BS x 4, soft, NT/ND.  ?Musculoskeletal: R BKA, LLE with gauze dressing. 1+ edema.  ?Skin: Intact, warm, no rashes. ? ?Assessment & Plan:  ? ?Hyperkalemia - in setting of AKI on CKD.  S/p 2 rounds temporizing measures overnight and K currently 6.7 (drawn before 2nd round of meds given). ?- Lasix 80mg  IV x 1 (on home Lasix 40mg  PO daily). ?- Follow K q4hrs. ?- Nephrology consult pending. ?- Was on lokelma prior to DC but not continued at time of DC, suspect will need long term. ? ?AKI on CKD 4 - unclear etiology of CKD 4, right hydro clearly chronic and unchanged with foley placement - has many RF including DM and CRS, HTN. Suspect acute injury combination of poor PO intake, CKD  and lack of lokelma.  ?- given IVC filter in place and worsening DVT recently, will check renal vasc u/s (doppler) to ensure adequate flow.  The kidney size on [place] is not really consistent with a chronic vascular problem.  ?- Lasix 80mg  IV x 1 (on home Lasix 40mg  PO daily). ?- allow PO fluid intake   ?- keep foley for strict I/O, ensure complete bladder emptying - making >50 ml/hr after foley placed.  ? ?Chronic Right hydronephrosis - seen by urology briefly inpt in Dec. - noted cortical thinning c/w chronic hydro , ?stricture, u/l hydro not typical  of BPH. ?- Monitor, consider getting urology input while inpatient vs close follow up after discharge. ? ?Non anion gap metabolic acidosis - in setting of AKI on CKD.  ?- trend for now, may need PO bicarb supplement if not improving.  ? ?DVT, PE - on coumadin.  ?- Coumadin per pharmacy. ? ?Chronic pain.  ?- okay for home oxy.  ?- resume home gaba at renal dosing.  ? ?HTN; Chronic systolic, diastolic CHF - 00/86 Echo: EF 35%, global hypokineses, LVH, RV mod enlarged, mPAP 34 mmHG, dilated ascending aortia 4.2 cm.   ?- Hold home BP meds for now to ensure adequate renal perfusion.  ? ?DM2 - not on meds at home.  ?- trend accuchecks, SSI if needed.  ? ?GERD.  ?- Continue home PPI.  ? ?Hypothyroidism. ?- Continue home synthroid. ? ?Will follow up on repeat K and nephrology recs.  Might be stable enough for SDU downgrade and transfer to [facility] for AM pickup 3/3. ? ?Best Practice (right click and "Reselect all SmartList Selections" daily)  ? ?Diet/type: Regular consistency (see orders) ?DVT prophylaxis: coumadin per pharmacy  ?GI prophylaxis: PPI home med  ?Lines: N/A ?Foley:  Yes, and it is still needed ?Code Status:  full code ?Last date of multidisciplinary goals of care discussion []  ? ? ?Critical care time: 40 minutes   ? ?Montey Hora, PA - C ?[facility] ?For pager details, please see AMION or use Epic chat  ?After 1900, please call [facility] for cross coverage needs ?12/19/2021, 9:13 AM ? ?

## 2021-12-19 NOTE — Progress Notes (Addendum)
Informed Thomas [name] with wound care that the patient's bandage on his leftt lower extremity was removed, assessed, cleansed, and re-bandaged. The patient had venous stasis scabbing with cellulitis and skin breakdown. The dressing was replaced with xeroform (previous bandage present), ABD pads, kerlix wrap, followed by compression wrapping from ankle to below the patients right knee. Patient denied pain and tolerated this procedure well. Awaiting further instructions from the wound care team for further treatment options.  ?

## 2021-12-19 NOTE — Progress Notes (Signed)
Orthopedic Tech Progress Note ?Patient Details:  ?[name] ?06-04-1951 ?[id number] ? ?Patient ID: Thomas [name], male   DOB: [birth year], 71 y.o.   MRN: [id number] ? ?[name] ?12/19/2021, 12:38 PM ?Unna boot applied to LLE ?

## 2021-12-19 NOTE — ED Notes (Signed)
Patient was given two ham sandwiches and a cup of orange juice per the RN's request. ?

## 2021-12-19 NOTE — Progress Notes (Addendum)
eLink Physician-Brief Progress Note ?Patient Name: LADON VANDENBERGHE ?DOB: [birth year] ?MRN: [id number] ? ? ?Date of Service ? 12/19/2021  ?HPI/Events of Note ? 53M with multiple comorbidities including CKD stage IV, chronic foley for right hydronephrosis is admitted for severe hyperkalemia In setting of poor PO intake. Nephrology recommended medical management including lokelma, insulin and dextrose given at 0030. ? ?Labs reviewed K 7.2>5.5, INR 2.7 ? ?On exam, worsening mental status.  ?CBG 107 ?Meds given:Oxycodone 30 mg at 0141 ? ?Given narcan 0.4 mg with partial response but remains altered with garbled speech  ?  ?eICU Interventions ? STAT CT head > No acute intracranial abnormalities. Mental status normalized at next check at 5:40 AM ?Recheck K ? ?  ? ? ? ?Intervention Category ?Evaluation Type: New Patient Evaluation ? ?Axie Hayne Rodman Pickle ?12/19/2021, 3:10 AM ?

## 2021-12-19 NOTE — Progress Notes (Addendum)
PCCM Interval Progress Note ? ?Repeat K remains 6.2. ?Full BMP added on. ? ?Will give additional dose 10g Lokelma + 80mg  IV Furosemide q12 x 2 doses. ? ? ?Montey Hora, PA - C ?[facility] ?For pager details, please see AMION or use Epic chat  ?After 1900, please call [facility] for cross coverage needs ?12/19/2021, 2:50 PM ? ?

## 2021-12-19 NOTE — Progress Notes (Signed)
?  Echocardiogram ?2D Echocardiogram has been performed. ? ?Thomas [name] ?12/19/2021, 1:37 PM ?

## 2021-12-19 NOTE — Progress Notes (Signed)
Renal artery duplex has been completed.  Limited study due to habitus ? ? ?Results can be found under chart review under CV PROC. ?12/19/2021 11:08 AM ?Mansoor Hillyard RVT, RDMS ? ?

## 2021-12-19 NOTE — Consult Note (Signed)
[facility]  HISTORY AND PHYSICAL  Thomas [name] is an 71 y.o. male.    Chief Complaint: weakness  HPI: Pt is a 18M with a Pmh sig for CKD Iv, Htn, HLD, DM, BPH, Chronic R hydro who is seen in consultation for AKI on CKD and hyperkalemia.  Was recently d/c'd 2/26 for LE cellulitis.  Prior to admission was on lokelma but not continued at time of d/c.  Comes in with pain all over, weakness, AKI on CKD.    K 7.2 in ED and Cr up to 5.5 from 2.4.  Foley placed, making quite a bit of urine since placement.  Has received medical therapy for hyperkalemia including lokelma, dextrose/ insulin, calcium. Lasix.    Pt reports he had an episode of urinary retention before and needed a Foley catheter.  Doesn't remember how long ago.  No hypotensive events or NSAIDS.  Doesn't look like he had ARB/ACEi on MAR.   Had oxycontin and got oversedated here- had narcan with good effect, CT head negative.    PMH: Past Medical History:  Diagnosis Date   Arthritis    BPH (benign prostatic hyperplasia) 11/24/2019   Chronic pain syndrome 11/24/2019   CKD (chronic kidney disease), stage IV ([place]) 11/24/2019   Diabetes mellitus without complication (HCC)    Gout    Hypertension    Muscle pain    Physical deconditioning 11/24/2019   Scars    Swelling    PSH: Past Surgical History:  Procedure Laterality Date   BELOW KNEE LEG AMPUTATION Right    ESOPHAGOGASTRODUODENOSCOPY (EGD) WITH PROPOFOL N/A 11/25/2019   Procedure: ESOPHAGOGASTRODUODENOSCOPY (EGD) WITH PROPOFOL;  Surgeon: Carol Ada, MD;  Location: [facility];  Service: Endoscopy;  Laterality: N/A;   IR FLUORO GUIDE CV LINE RIGHT  09/05/2019   IR LUMBAR DISC ASPIRATION W/IMG GUIDE  11/26/2019   IR US GUIDE VASC ACCESS RIGHT  09/05/2019   RADIOLOGY WITH ANESTHESIA N/A 11/25/2019   Procedure: Thoracic and Lumbar MRI WITH ANESTHESIA;  Surgeon: Radiologist, Medication, MD;  Location: [facility];  Service: Radiology;  Laterality: N/A;    Past Medical  History:  Diagnosis Date   Arthritis    BPH (benign prostatic hyperplasia) 11/24/2019   Chronic pain syndrome 11/24/2019   CKD (chronic kidney disease), stage IV (HCC) 11/24/2019   Diabetes mellitus without complication (HCC)    Gout    Hypertension    Muscle pain    Physical deconditioning 11/24/2019   Scars    Swelling     Medications:  Scheduled:  acetaminophen  650 mg Oral Once   amLODipine  5 mg Oral Daily   chlorhexidine  15 mL Mouth Rinse BID   Chlorhexidine Gluconate Cloth  6 each Topical Daily   Chlorhexidine Gluconate Cloth  6 each Topical Q0600   cloNIDine  0.1 mg Oral TID   finasteride  5 mg Oral Daily   gabapentin  100 mg Oral TID   levothyroxine  50 mcg Oral Q0600   mouth rinse  15 mL Mouth Rinse q12n4p   mupirocin ointment  1 application Nasal BID   pantoprazole  40 mg Oral BID   sodium bicarbonate  50 mEq Intravenous Once   sodium zirconium cyclosilicate  10 g Oral TID   tamsulosin  0.4 mg Oral Daily   warfarin  7.5 mg Oral ONCE-1600   Warfarin - Pharmacist Dosing Inpatient   Does not apply q1600    Medications Prior to Admission  Medication Sig Dispense Refill  allopurinol (ZYLOPRIM) 100 MG tablet Take 100 mg by mouth 2 (two) times daily.     bisacodyl (DULCOLAX) 10 MG suppository Place 10 mg rectally daily.     colchicine 0.6 MG tablet Take 0.6 mg by mouth 2 (two) times daily.     ferrous sulfate 325 (65 FE) MG tablet Take 1 tablet (325 mg total) by mouth daily with breakfast. 30 tablet 0   furosemide (LASIX) 40 MG tablet Take 40 mg by mouth every morning.     gabapentin (NEURONTIN) 300 MG capsule Take 1 capsule (300 mg total) by mouth at bedtime. (Patient taking differently: Take 300 mg by mouth every 8 (eight) hours.) 20 capsule 0   levothyroxine (SYNTHROID) 50 MCG tablet Take 50 mcg by mouth daily before breakfast.      OXYCONTIN 30 MG 12 hr tablet Take 1 tablet (30 mg total) by mouth 2 (two) times daily. (Patient taking differently: Take 30 mg by mouth  every 8 (eight) hours.) 38 tablet 0   polyethylene glycol (MIRALAX) 17 g packet Take 17 g by mouth daily as needed. (Patient taking differently: Take 17 g by mouth daily as needed (constipation).) 14 each 0   warfarin (COUMADIN) 5 MG tablet Take 5 mg by mouth daily.     acetaminophen (TYLENOL) 500 MG tablet Take 500 mg by mouth every 6 (six) hours as needed for moderate pain.     amLODipine (NORVASC) 5 MG tablet Take 5 mg by mouth daily.     atorvastatin (LIPITOR) 80 MG tablet Take 1 tablet (80 mg total) by mouth daily. 30 tablet 0   cloNIDine (CATAPRES) 0.1 MG tablet Take 0.1 mg by mouth 3 (three) times daily.     finasteride (PROSCAR) 5 MG tablet Take 1 tablet (5 mg total) by mouth daily. 30 tablet 0   hydrALAZINE (APRESOLINE) 100 MG tablet Take 1 tablet by mouth every 8 hours. (Patient taking differently: Take 100 mg by mouth every 8 (eight) hours as needed (high blood pressure >150/100).) 90 tablet 0   metoprolol tartrate (LOPRESSOR) 100 MG tablet Take 0.5 tablets (50 mg total) by mouth daily. (Patient not taking: Reported on 12/19/2021) 30 tablet 2   metoprolol tartrate (LOPRESSOR) 50 MG tablet Take 50 mg by mouth 2 (two) times daily.     naloxegol oxalate (MOVANTIK) 25 MG TABS tablet Take by mouth daily.     pantoprazole (PROTONIX) 40 MG tablet Take 1 tablet by mouth 2 times daily. (Patient not taking: Reported on 12/07/2021) 60 tablet 0   senna-docusate (SENOKOT-S) 8.6-50 MG tablet Take 1 tablet by mouth 2 (two) times daily for 7 days. 14 tablet 0   tamsulosin (FLOMAX) 0.4 MG CAPS capsule Take 1 capsule (0.4 mg total) by mouth daily. 30 capsule 0   tiZANidine (ZANAFLEX) 4 MG tablet Take 4 mg by mouth 3 (three) times daily.     warfarin (COUMADIN) 2.5 MG tablet Take 3 tablets (7.5 mg total) by mouth one time only at 4 PM. INR check in 3 days and adjust coumadin dose for INR of 2-3 (Patient not taking: Reported on 12/19/2021) 90 tablet 0    ALLERGIES:   Allergies  Allergen Reactions   Other  Other (See Comments)    Blood pressure issues Per [facility] hospital: Pt states he can only take these pain meds or else he gets very sick: Oxycontin,morphine,demerol, and dilaudid are the only pain meds pt states he can take.     Oxymorphone Other (See Comments)  Causes kidney problems   Aspirin Nausea And Vomiting    Per [facility]   Beta Vulgaris Nausea And Vomiting    Beets   Buspirone Nausea And Vomiting and Other (See Comments)    Affected his head   Cabbage Nausea And Vomiting   Codeine Other (See Comments)    Unknown reaction   Dolobid [Diflunisal] Other (See Comments)    Unknown reaction   Fish Allergy Nausea And Vomiting   Fish-Derived Products Nausea And Vomiting   Methadone Other (See Comments)    Made him loopy   Pentazocine Other (See Comments)    Unknown reaction   Propoxyphene Nausea And Vomiting and Other (See Comments)    Messed with his head   Shellfish Allergy Nausea And Vomiting   Sulfa Antibiotics Hives   Sulfasalazine Other (See Comments)    Unknown reaction   Vistaril [Hydroxyzine] Nausea And Vomiting and Other (See Comments)    Didn't help   Amoxicillin Rash    Per [facility]    FAM HX: Family History  Problem Relation Age of Onset   Heart disease Mother    Cancer Father     Social History:   reports that he has never smoked. He has never been exposed to tobacco smoke. He has never used smokeless tobacco. He reports that he does not drink alcohol and does not use drugs.  ROS: ROS: all other systems reviewed and are negative except as per HPI  Blood pressure (!) 147/63, pulse 70, temperature 97.9 F (36.6 C), temperature source Oral, resp. rate 14, height 6\' 5"  (1.956 m), weight 120.9 kg, SpO2 99 %. PHYSICAL EXAM: Physical Exam GEN NAD, sitting in bed HEENT EOMI PERRL  NECK + JVD PULM clear CV RRR ABD soft, nontender EXT 1+ Le edema, s/p R BKA NEURO Aao x 3    Results for orders placed or performed during the  hospital encounter of 12/18/21 (from the past 48 hour(s))  Resp Panel by RT-PCR (Flu A&B, Covid) Nasopharyngeal Swab     Status: None   Collection Time: 12/18/21  9:45 PM   Specimen: Nasopharyngeal Swab; Nasopharyngeal(NP) swabs in vial transport medium  Result Value Ref Range   SARS Coronavirus 2 by RT PCR NEGATIVE NEGATIVE    Comment: (NOTE) SARS-CoV-2 target nucleic acids are NOT DETECTED.  The SARS-CoV-2 RNA is generally detectable in upper respiratory specimens during the acute phase of infection. The lowest concentration of SARS-CoV-2 viral copies this assay can detect is 138 copies/mL. A negative result does not preclude SARS-Cov-2 infection and should not be used as the sole basis for treatment or other patient management decisions. A negative result may occur with  improper specimen collection/handling, submission of specimen other than nasopharyngeal swab, presence of viral mutation(s) within the areas targeted by this assay, and inadequate number of viral copies(<138 copies/mL). A negative result must be combined with clinical observations, patient history, and epidemiological information. The expected result is Negative.  Fact Sheet for Patients:  EntrepreneurPulse.com.au  Fact Sheet for Healthcare Providers:  IncredibleEmployment.be  This test is no t yet approved or cleared by the [place] FDA and  has been authorized for detection and/or diagnosis of SARS-CoV-2 by FDA under an Emergency Use Authorization (EUA). This EUA will remain  in effect (meaning this test can be used) for the duration of the COVID-19 declaration under Section 564(b)(1) of the Act, 21 U.S.C.section 360bbb-3(b)(1), unless the authorization is terminated  or revoked sooner.  Influenza A by PCR NEGATIVE NEGATIVE   Influenza B by PCR NEGATIVE NEGATIVE    Comment: (NOTE) The Xpert Xpress SARS-CoV-2/FLU/RSV plus assay is intended as an aid in the  diagnosis of influenza from Nasopharyngeal swab specimens and should not be used as a sole basis for treatment. Nasal washings and aspirates are unacceptable for Xpert Xpress SARS-CoV-2/FLU/RSV testing.  Fact Sheet for Patients: EntrepreneurPulse.com.au  Fact Sheet for Healthcare Providers: IncredibleEmployment.be  This test is not yet approved or cleared by the [place] FDA and has been authorized for detection and/or diagnosis of SARS-CoV-2 by FDA under an Emergency Use Authorization (EUA). This EUA will remain in effect (meaning this test can be used) for the duration of the COVID-19 declaration under Section 564(b)(1) of the Act, 21 U.S.C. section 360bbb-3(b)(1), unless the authorization is terminated or revoked.  Performed at [facility], [place] [address], [place], [place] [address]   CBC with Differential     Status: Abnormal   Collection Time: 12/18/21  9:55 PM  Result Value Ref Range   WBC 9.9 4.0 - 10.5 K/uL   RBC 2.91 (L) 4.22 - 5.81 MIL/uL   Hemoglobin 9.3 (L) 13.0 - 17.0 g/dL   HCT 31.0 (L) 39.0 - 52.0 %   MCV 106.5 (H) 80.0 - 100.0 fL   MCH 32.0 26.0 - 34.0 pg   MCHC 30.0 30.0 - 36.0 g/dL   RDW 16.7 (H) 11.5 - 15.5 %   Platelets 186 150 - 400 K/uL   nRBC 0.0 0.0 - 0.2 %   Neutrophils Relative % 76 %   Neutro Abs 7.4 1.7 - 7.7 K/uL   Lymphocytes Relative 13 %   Lymphs Abs 1.3 0.7 - 4.0 K/uL   Monocytes Relative 8 %   Monocytes Absolute 0.8 0.1 - 1.0 K/uL   Eosinophils Relative 3 %   Eosinophils Absolute 0.3 0.0 - 0.5 K/uL   Basophils Relative 0 %   Basophils Absolute 0.0 0.0 - 0.1 K/uL   Immature Granulocytes 0 %   Abs Immature Granulocytes 0.04 0.00 - 0.07 K/uL    Comment: Performed at [facility], [place] [address], [place], [place] [address]  Comprehensive metabolic panel     Status: Abnormal   Collection Time: 12/18/21  9:55 PM  Result Value Ref Range   Sodium 131 (L) 135 -  145 mmol/L   Potassium 7.2 (HH) 3.5 - 5.1 mmol/L    Comment: CRITICAL RESULT CALLED TO, READ BACK BY AND VERIFIED WITH:  LOU MERRICKS RN 12/18/21 @ 2221 VS NO VISIBLE HEMOLYSIS    Chloride 101 98 - 111 mmol/L   CO2 18 (L) 22 - 32 mmol/L   Glucose, Bld 117 (H) 70 - 99 mg/dL    Comment: Glucose reference range applies only to samples taken after fasting for at least 8 hours.   BUN 73 (H) 8 - 23 mg/dL   Creatinine, Ser 5.95 (H) 0.61 - 1.24 mg/dL   Calcium 7.2 (L) 8.9 - 10.3 mg/dL   Total Protein 8.2 (H) 6.5 - 8.1 g/dL   Albumin 3.6 3.5 - 5.0 g/dL   AST 25 15 - 41 U/L   ALT 9 0 - 44 U/L   Alkaline Phosphatase 82 38 - 126 U/L   Total Bilirubin 0.3 0.3 - 1.2 mg/dL   GFR, Estimated 10 (L) >60 mL/min    Comment: (NOTE) Calculated using the CKD-EPI Creatinine Equation (2021)    Anion gap 12 5 - 15  Comment: Performed at [facility], [place] [address], [place], [place] [address]  CBG monitoring, ED     Status: Abnormal   Collection Time: 12/19/21 12:30 AM  Result Value Ref Range   Glucose-Capillary 112 (H) 70 - 99 mg/dL    Comment: Glucose reference range applies only to samples taken after fasting for at least 8 hours.  CBG monitoring, ED     Status: None   Collection Time: 12/19/21  1:49 AM  Result Value Ref Range   Glucose-Capillary 76 70 - 99 mg/dL    Comment: Glucose reference range applies only to samples taken after fasting for at least 8 hours.  Renal function panel     Status: Abnormal   Collection Time: 12/19/21  1:51 AM  Result Value Ref Range   Sodium 134 (L) 135 - 145 mmol/L   Potassium 5.5 (H) 3.5 - 5.1 mmol/L    Comment: DELTA CHECK NOTED   Chloride 103 98 - 111 mmol/L   CO2 20 (L) 22 - 32 mmol/L   Glucose, Bld 79 70 - 99 mg/dL    Comment: Glucose reference range applies only to samples taken after fasting for at least 8 hours.   BUN 75 (H) 8 - 23 mg/dL   Creatinine, Ser 5.96 (H) 0.61 - 1.24 mg/dL   Calcium 7.0 (L) 8.9 - 10.3 mg/dL   Phosphorus  9.0 (H) 2.5 - 4.6 mg/dL   Albumin 3.1 (L) 3.5 - 5.0 g/dL   GFR, Estimated 10 (L) >60 mL/min    Comment: (NOTE) Calculated using the CKD-EPI Creatinine Equation (2021)    Anion gap 11 5 - 15    Comment: Performed at [facility], [place] [address] [place], [place] [address]  Protime-INR     Status: Abnormal   Collection Time: 12/19/21  1:51 AM  Result Value Ref Range   Prothrombin Time 28.2 (H) 11.4 - 15.2 seconds   INR 2.7 (H) 0.8 - 1.2    Comment: (NOTE) INR goal varies based on device and disease states. Performed at [facility], [place] [address], [place], [place] [address]   Magnesium     Status: None   Collection Time: 12/19/21  1:51 AM  Result Value Ref Range   Magnesium 2.4 1.7 - 2.4 mg/dL    Comment: Performed at [facility], [place] [address], [place], [place] [address]  Hepatic function panel     Status: Abnormal   Collection Time: 12/19/21  1:51 AM  Result Value Ref Range   Total Protein 7.3 6.5 - 8.1 g/dL   Albumin 3.1 (L) 3.5 - 5.0 g/dL   AST 23 15 - 41 U/L   ALT 9 0 - 44 U/L   Alkaline Phosphatase 69 38 - 126 U/L   Total Bilirubin 0.1 (L) 0.3 - 1.2 mg/dL   Bilirubin, Direct <0.1 0.0 - 0.2 mg/dL   Indirect Bilirubin NOT CALCULATED 0.3 - 0.9 mg/dL    Comment: Performed at [facility], [place] [address] [place], [place] [address]  Urinalysis, Routine w reflex microscopic     Status: Abnormal   Collection Time: 12/19/21  2:17 AM  Result Value Ref Range   Color, Urine YELLOW YELLOW   APPearance CLOUDY (A) CLEAR   Specific Gravity, Urine 1.011 1.005 - 1.030   pH 6.0 5.0 - 8.0   Glucose, UA NEGATIVE NEGATIVE mg/dL   Hgb urine dipstick SMALL (A) NEGATIVE   Bilirubin Urine NEGATIVE NEGATIVE  Ketones, ur NEGATIVE NEGATIVE mg/dL   Protein, ur >=300 (A) NEGATIVE mg/dL   Nitrite NEGATIVE NEGATIVE   Leukocytes,Ua LARGE (A) NEGATIVE   RBC / HPF 21-50 0 - 5 RBC/hpf   WBC, UA >50 (H) 0 - 5 WBC/hpf    Bacteria, UA NONE SEEN NONE SEEN   Squamous Epithelial / LPF 0-5 0 - 5   WBC Clumps PRESENT     Comment: Performed at [facility], [place] [address], [place], [place] [address]  CBG monitoring, ED     Status: None   Collection Time: 12/19/21  2:41 AM  Result Value Ref Range   Glucose-Capillary 88 70 - 99 mg/dL    Comment: Glucose reference range applies only to samples taken after fasting for at least 8 hours.  Glucose, capillary     Status: Abnormal   Collection Time: 12/19/21  3:22 AM  Result Value Ref Range   Glucose-Capillary 107 (H) 70 - 99 mg/dL    Comment: Glucose reference range applies only to samples taken after fasting for at least 8 hours.  MRSA Next Gen by PCR, Nasal     Status: Abnormal   Collection Time: 12/19/21  3:28 AM   Specimen: Nasal Mucosa; Nasal Swab  Result Value Ref Range   MRSA by PCR Next Gen DETECTED (A) NOT DETECTED    Comment: CRITICAL RESULT CALLED TO, READ BACK BY AND VERIFIED WITH:  STACY WEAVER RN 12/19/21 @ 7858 VS (NOTE) The GeneXpert MRSA Assay (FDA approved for NASAL specimens only), is one component of a comprehensive MRSA colonization surveillance program. It is not intended to diagnose MRSA infection nor to guide or monitor treatment for MRSA infections. Test performance is not FDA approved in patients less than 31 years old. Performed at [facility], [place] [address], [place], [place] [address]   Protime-INR     Status: Abnormal   Collection Time: 12/19/21  5:43 AM  Result Value Ref Range   Prothrombin Time 27.7 (H) 11.4 - 15.2 seconds   INR 2.6 (H) 0.8 - 1.2    Comment: (NOTE) INR goal varies based on device and disease states. Performed at [facility], [place] [address] [place], [place] [address]   CK     Status: Abnormal   Collection Time: 12/19/21  5:43 AM  Result Value Ref Range   Total CK 558 (H) 49 - 397 U/L    Comment: Performed at [facility], [place]  [address], [place], [place] [address]  CBC     Status: Abnormal   Collection Time: 12/19/21  5:43 AM  Result Value Ref Range   WBC 7.6 4.0 - 10.5 K/uL   RBC 2.64 (L) 4.22 - 5.81 MIL/uL   Hemoglobin 8.4 (L) 13.0 - 17.0 g/dL   HCT 27.9 (L) 39.0 - 52.0 %   MCV 105.7 (H) 80.0 - 100.0 fL   MCH 31.8 26.0 - 34.0 pg   MCHC 30.1 30.0 - 36.0 g/dL   RDW 16.3 (H) 11.5 - 15.5 %   Platelets 177 150 - 400 K/uL   nRBC 0.0 0.0 - 0.2 %    Comment: Performed at [facility], [place] [address] [place], [place] [address]  Basic metabolic panel     Status: Abnormal   Collection Time: 12/19/21  5:43 AM  Result Value Ref Range   Sodium 133 (L) 135 - 145 mmol/L   Potassium 6.1 (H) 3.5 - 5.1 mmol/L   Chloride  103 98 - 111 mmol/L   CO2 21 (L) 22 - 32 mmol/L   Glucose, Bld 135 (H) 70 - 99 mg/dL    Comment: Glucose reference range applies only to samples taken after fasting for at least 8 hours.   BUN 79 (H) 8 - 23 mg/dL   Creatinine, Ser 5.55 (H) 0.61 - 1.24 mg/dL   Calcium 7.0 (L) 8.9 - 10.3 mg/dL   GFR, Estimated 10 (L) >60 mL/min    Comment: (NOTE) Calculated using the CKD-EPI Creatinine Equation (2021)    Anion gap 9 5 - 15    Comment: Performed at [facility], [place] [address] [place], [place] [address]  Magnesium     Status: None   Collection Time: 12/19/21  5:43 AM  Result Value Ref Range   Magnesium 2.4 1.7 - 2.4 mg/dL    Comment: Performed at [facility], [place] [address] [place], [place] [address]  Phosphorus     Status: Abnormal   Collection Time: 12/19/21  5:43 AM  Result Value Ref Range   Phosphorus 9.5 (H) 2.5 - 4.6 mg/dL    Comment: Performed at [facility], [place] [address] [place], [place] [address]  TSH     Status: Abnormal   Collection Time: 12/19/21  5:43 AM  Result Value Ref Range   TSH 5.805 (H) 0.350 - 4.500 uIU/mL    Comment: Performed by a 3rd Generation assay with a functional sensitivity of <=0.01  uIU/mL. Performed at [facility], [place] [address], [place], [place] [address]   Hemoglobin A1c     Status: None   Collection Time: 12/19/21  5:43 AM  Result Value Ref Range   Hgb A1c MFr Bld 4.8 4.8 - 5.6 %    Comment: (NOTE) Pre diabetes:          5.7%-6.4%  Diabetes:              >6.4%  Glycemic control for   <7.0% adults with diabetes    Mean Plasma Glucose 91.06 mg/dL    Comment: Performed at [place] [address], [place], [place] [address]  Protime-INR     Status: Abnormal   Collection Time: 12/19/21  5:43 AM  Result Value Ref Range   Prothrombin Time 27.5 (H) 11.4 - 15.2 seconds   INR 2.6 (H) 0.8 - 1.2    Comment: (NOTE) INR goal varies based on device and disease states. Performed at [facility], [place] [address], [place], [place] [address]   Basic metabolic panel     Status: Abnormal   Collection Time: 12/19/21  5:43 AM  Result Value Ref Range   Sodium 132 (L) 135 - 145 mmol/L   Potassium 6.1 (H) 3.5 - 5.1 mmol/L   Chloride 102 98 - 111 mmol/L   CO2 20 (L) 22 - 32 mmol/L   Glucose, Bld 136 (H) 70 - 99 mg/dL    Comment: Glucose reference range applies only to samples taken after fasting for at least 8 hours.   BUN 79 (H) 8 - 23 mg/dL   Creatinine, Ser 5.51 (H) 0.61 - 1.24 mg/dL   Calcium 7.0 (L) 8.9 - 10.3 mg/dL   GFR, Estimated 11 (L) >60 mL/min    Comment: (NOTE) Calculated using the CKD-EPI Creatinine Equation (2021)    Anion gap 10 5 - 15    Comment: Performed at [facility], [place] [address] [place], [place]  [address]  Basic metabolic panel     Status: Abnormal   Collection Time: 12/19/21  7:11 AM  Result Value Ref Range   Sodium 134 (L) 135 - 145 mmol/L   Potassium 6.7 (HH) 3.5 - 5.1 mmol/L    Comment: NO VISIBLE HEMOLYSIS CRITICAL RESULT CALLED TO, READ BACK BY AND VERIFIED WITH: ALLEN,D. RN AT 2585 12/19/21 MULLINS,T    Chloride 104 98 - 111 mmol/L   CO2 19 (L) 22 - 32 mmol/L    Glucose, Bld 113 (H) 70 - 99 mg/dL    Comment: Glucose reference range applies only to samples taken after fasting for at least 8 hours.   BUN 78 (H) 8 - 23 mg/dL   Creatinine, Ser 5.44 (H) 0.61 - 1.24 mg/dL   Calcium 6.8 (L) 8.9 - 10.3 mg/dL   GFR, Estimated 11 (L) >60 mL/min    Comment: (NOTE) Calculated using the CKD-EPI Creatinine Equation (2021)    Anion gap 11 5 - 15    Comment: Performed at [facility], [place] [address], [place], [place] [address]  Glucose, capillary     Status: Abnormal   Collection Time: 12/19/21  7:56 AM  Result Value Ref Range   Glucose-Capillary 117 (H) 70 - 99 mg/dL    Comment: Glucose reference range applies only to samples taken after fasting for at least 8 hours.   Comment 1 Notify RN    Comment 2 Document in Chart   Potassium     Status: Abnormal   Collection Time: 12/19/21 10:19 AM  Result Value Ref Range   Potassium 6.2 (H) 3.5 - 5.1 mmol/L    Comment: Performed at [facility], [place] [address], [place], [place] [address]  Glucose, capillary     Status: Abnormal   Collection Time: 12/19/21 11:10 AM  Result Value Ref Range   Glucose-Capillary 107 (H) 70 - 99 mg/dL    Comment: Glucose reference range applies only to samples taken after fasting for at least 8 hours.   Comment 1 Notify RN    Comment 2 Document in Chart     CT HEAD WO CONTRAST (5MM)  Result Date: 12/19/2021 CLINICAL DATA:  71 year old male with altered mental status. EXAM: CT HEAD WITHOUT CONTRAST TECHNIQUE: Contiguous axial images were obtained from the base of the skull through the vertex without intravenous contrast. RADIATION DOSE REDUCTION: This exam was performed according to the departmental dose-optimization program which includes automated exposure control, adjustment of the mA and/or kV according to patient size and/or use of iterative reconstruction technique. COMPARISON:  Head CT 10/07/2021 and earlier. FINDINGS: Brain: Stable cerebral  volume. No midline shift, ventriculomegaly, mass effect, evidence of mass lesion, intracranial hemorrhage or evidence of cortically based acute infarction. 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 Elsewhere gray-white matter differentiation appears normal for age. No cortical encephalomalacia identified. Vascular: Calcified atherosclerosis at the skull base. No suspicious intracranial vascular hyperdensity. Skull: No acute osseous abnormality identified. Sinuses/Orbits: Visualized paranasal sinuses and mastoids are stable and well aerated. Other: Visualized scalp soft tissues are within normal limits. Slight leftward gaze, otherwise negative visible orbits. IMPRESSION: No acute intracranial abnormality. Stable mild for age chronic small vessel disease. Electronically Signed   By: Genevie Ann M.D.   On: 12/19/2021 05:26   US RENAL  Result Date: 12/19/2021 CLINICAL DATA:  Acute kidney injury EXAM: RENAL / URINARY TRACT ULTRASOUND COMPLETE COMPARISON:  10/08/2021 FINDINGS: Right Kidney: Renal measurements: 12.4  x 5.5 x 5.2 cm = volume: 188 mL. Severe right hydronephrosis with overlying cortical thinning. No change since prior study. Left Kidney: Renal measurements: 16.2 x 5.6 x 5.1 cm = volume: 2.4 mL. No hydronephrosis. Normal echotexture. Small cysts measuring up to 1.5 cm. Bladder: Decompressed, not visualized. Other: None. IMPRESSION: Severe chronic right hydronephrosis with overlying cortical thinning. Findings stable since prior study. Electronically Signed   By: Rolm Baptise M.D.   On: 12/19/2021 01:23   VAS US RENAL ARTERY DUPLEX  Result Date: 12/19/2021 ABDOMINAL VISCERAL Patient Name:  ANDRIUS ANDREPONT  Date of Exam:   12/19/2021 Medical Rec #: [id number]        Accession #:    [id number] Date of Birth: [birth year]        Patient Gender: M Patient Age:   36 years Exam Location:  [facility] Procedure:      VAS US RENAL ARTERY DUPLEX Referring Phys:  [id number] ELISE L STEPHENSON -------------------------------------------------------------------------------- High Risk Factors: Hypertension, Diabetes, no history of smoking. Other Factors: CKD4, CHF,. Limitations: Obesity, patient discomfort and severe right side hydronephrosis. Comparison Study: No previous exams Performing Technologist: Jody Hill RVT, RDMS  Examination Guidelines: A complete evaluation includes B-mode imaging, spectral Doppler, color Doppler, and power Doppler as needed of all accessible portions of each vessel. Bilateral testing is considered an integral part of a complete examination. Limited examinations for reoccurring indications may be performed as noted.  Duplex Findings: +----------+--------+--------+------+--------+  Mesenteric PSV cm/s EDV cm/s Plaque Comments  +----------+--------+--------+------+--------+  Aorta Prox    83       13                     +----------+--------+--------+------+--------+    +-----------------+--------+--------+-------+  Left Renal Artery PSV cm/s EDV cm/s Comment  +-----------------+--------+--------+-------+  Origin              123       22             +-----------------+--------+--------+-------+  Proximal            110       16             +-----------------+--------+--------+-------+  Mid                  63       19             +-----------------+--------+--------+-------+ +------------+--------+--------+--+-----------+--------+--------+----+  Right Kidney PSV cm/s EDV cm/s RI Left Kidney PSV cm/s EDV cm/s RI    +------------+--------+--------+--+-----------+--------+--------+----+  Upper Pole                        Upper Pole  35       8        0.74  +------------+--------+--------+--+-----------+--------+--------+----+  Mid                               Mid         21       7        0.65  +------------+--------+--------+--+-----------+--------+--------+----+  Lower Pole                        Lower Pole  24       6        0.75   +------------+--------+--------+--+-----------+--------+--------+----+  Hilar  Hilar       64       13       0.80  +------------+--------+--------+--+-----------+--------+--------+----+ +------------------+-----+------------------+-----+  Right Kidney             Left Kidney               +------------------+-----+------------------+-----+  RAR                      RAR                       +------------------+-----+------------------+-----+  RAR (manual)             RAR (manual)              +------------------+-----+------------------+-----+  Cortex                   Cortex                    +------------------+-----+------------------+-----+  Cortex thickness         Corex thickness           +------------------+-----+------------------+-----+  Kidney length (cm) 14.10 Kidney length (cm) 15.61  +------------------+-----+------------------+-----+   Summary: Renal:  Right: Right side portion of renal artery duplex is non diagnostic.        Due to body habitus and severe hydronephrosis unable to        evaluate perfusion to and within right kidney. Left:  No evidence of left renal artery stenosis. Abnormal left        Resisitve Index. LRV flow present. Mesenteric:  SMA and celiac artery not visualized on this exam.  *See table(s) above for measurements and observations.     Preliminary     Assessment/Plan  AKI on CKD: baseline 2.4, up to 5.5.  Suspect possible retention +/- hemodynamic issues although not evident here.  Duplex not reflective of embolus.    - agree with Foley placement- keep for now  - aggressive treatment of hyperkalemia  - no indication for dialysis as of now  2.  Hyperkalemia:  - lokelma TID  - s/p calcium  - renal diet  - give amp of bicarb  - Lasix IV  - treatment of potential bladder retention  - serial K measurements  3. Chronic systolic CHF:  - getting IV Lasix  4.  Chronic R hydro: agree with urology input.  5.  Dispo: ICU  Jamire Shabazz,  [place] 12/19/2021, 1:30 PM

## 2021-12-19 NOTE — H&P (Signed)
NAME:  Thomas [name], MRN:  [id number], DOB:  [birth year], LOS: 0 ADMISSION DATE:  12/18/2021, CONSULTATION DATE:  12/19/2021 REFERRING MD: Thamas Jaegers, CHIEF COMPLAINT:  worsening pain   History of Present Illness:  Pt is a 71 yo male with complex PMHx including DM2, PAD, DVT, pulm embolus, & CKD IV presenting with complaints of generalized unwell feeling & worsening baseline tremors. He was discharged on 2/27 following admission for  LLE cellulitis, propagation of DVT in setting of eliquis failure. He was transitioned to warfarin with follow up planned for this week. In ED he was found to have worsening renal function and severe hyperkalemia of 7.2. He had slightly peaked T waves and ICU called for admission. Nephrology consulted in ED, no plans for emergent/urgent HD.  He reports gradually worsening over last ew days. Taking meds as RX. No OTC meds or supplements. Says appetite is okay but doesn't get much down when in so much pain. Says pain is "everywhere from neck down."     Pertinent  Medical History    Left leg cellulitis   DVT (deep venous thrombosis) (HCC) - failure of Eliquis    SVT (supraventricular tachycardia) (HCC)   [name], right ([place])   [name], type 2) ([place])   Gout   HTN (hypertension)   HTN Emergency /possible PRES    Chronic combined systolic and diastolic CHF (congestive heart failure) (HCC)   GERD (gastroesophageal reflux disease)   BPH (benign prostatic hyperplasia)   CKD (chronic kidney disease), stage IV (HCC)   Chronic pain syndrome   Physical deconditioning   Hypothyroidism   Obesity, Class I, BMI 30-34.9  Significant Hospital Events: Including procedures, antibiotic start and stop dates in addition to other pertinent events   2/27 discharged home (admitted 2/18-2/26)   Interim History / Subjective:   Objective   Blood pressure 126/64, pulse 75, temperature 98.2 F (36.8 C), temperature source Oral, resp. rate 13, height 6\' 5"  (1.956  m), weight 121.7 kg, SpO2 94 %.       No intake or output data in the 24 hours ending 12/19/21 0151 Filed Weights   12/18/21 2120  Weight: 121.7 kg    Examination: Gen: chronically ill appearing, obese  HENT: NCAT, OP clear, neck supple without masses PULM: CTA Bilaterally, no WRRR CV: RRR, no mgr, no JVD GI: BS+, soft, nontender, no hsm Derm: warm throughout  MSK: Right BKA with sleeve in place, left LE wrapped in gauze, knee with gauze overtop  Neuro: A&Ox4,  moves all 4 spontaneously  Psych: normal mood and affect  Resolved Hospital Problem list    Assessment & Plan:  Hyperkalemia  - in setting of AKI on CKD  - given IV insulin, dextrose, lokelma in ED  - repeat BMP pending  - was on lokelma prior to DC but not continued at time of DC, suspect will need long term   AKI on CKD 4  - unclear etiology of CKD 4, right hydro clearly chronic and unchanged with foley placement - has many RF including DM and CRS, HTN - suspect acute injury combination of poor PO intake, CKD and lack of lokelma.  - given IVC filter in place and worsening DVT recently, will check renal vasc u/s (doppler) to ensure adequate flow.  The kidney size on [place] is not really consistent with a chronic vascular problem.  - hold diuresis & IVF tonight, but will likely need to resume diuresis in AM given  EF 35%  - allow PO fluid intake   - keep foley for strict I/O, ensure complete bladder emptying - making >50 ml/hr after foley placed   Chronic Right hydronephrosis - seen by urology briefly inpt in Dec. - noted cortical thinning c/w chronic hydro , ?stricture  - u/l hydro not typical of BPH - given persistent UA with gross hematuria, would d/w urology considering of further eval for stricture vs. bladder tumor.   Non anion gap metabolic acidosis  - in setting of AKI on CKD  - trend for now, may need pO bicarb supplement if not improving   DVT, PE  - on coumadin  - check INR  - dosing per pharmacy    Chronic pain  - okay for home oxy  -resume home gaba at renal dosing   HTN; Chronic systolic, diastolic CHF - 50/53 Echo: EF 35%, global hypokineses, LVH, RV mod enlarged, mPAP 34 mmHG, dilated ascending aortia 4.2 cm   - can't find any prior cardiac workup, suspicious for ischemic disease given peripheral vascular issues  - doesn't appear decompensated at present  - diuresis as discussed above  - resume home BP meds in AM - holding tonight given normotensive and avoidance of hypotension to ensure adequate renal perfusion   DM2  - not on meds at home  - given IV insulin, will have p[rlonged effect w/ renal function  - trend accuchecks, SSI if needed   GERD  - resume home PPI   Morbid obesity   Hypothyroid  - home synthroid, check tsh    Best Practice (right click and "Reselect all SmartList Selections" daily)   Diet/type: Regular consistency (see orders) DVT prophylaxis: coumadin per pharmacy  GI prophylaxis: PPI home med  Lines: N/A Foley:  Yes, and it is still needed Code Status:  full code Last date of multidisciplinary goals of care discussion []   Labs   CBC: Recent Labs  Lab 12/12/21 0750 12/13/21 0558 12/14/21 0828 12/15/21 0559 12/18/21 2155  WBC 5.1 5.1 5.4 5.8 9.9  NEUTROABS  --   --   --   --  7.4  HGB 9.9* 10.1* 9.8* 9.9* 9.3*  HCT 31.5* 31.7* 31.3* 31.5* 31.0*  MCV 100.3* 102.3* 103.0* 104.0* 106.5*  PLT 180 194 188 204 976    Basic Metabolic Panel: Recent Labs  Lab 12/18/21 2155  NA 131*  K 7.2*  CL 101  CO2 18*  GLUCOSE 117*  BUN 73*  CREATININE 5.95*  CALCIUM 7.2*   GFR: Estimated Creatinine Clearance: 17.2 mL/min (A) (by C-G formula based on SCr of 5.95 mg/dL (H)). Recent Labs  Lab 12/13/21 0558 12/14/21 0828 12/15/21 0559 12/18/21 2155  WBC 5.1 5.4 5.8 9.9    Liver Function Tests: Recent Labs  Lab 12/18/21 2155  AST 25  ALT 9  ALKPHOS 82  BILITOT 0.3  PROT 8.2*  ALBUMIN 3.6   No results for input(s): LIPASE,  AMYLASE in the last 168 hours. No results for input(s): AMMONIA in the last 168 hours.  ABG    Component Value Date/Time   PHART 7.384 09/23/2021 2000   PCO2ART 34.9 09/23/2021 2000   PO2ART 110 (H) 09/23/2021 2000   HCO3 20.4 09/23/2021 2000   ACIDBASEDEF 3.5 (H) 09/23/2021 2000   O2SAT 98.0 09/23/2021 2000     Coagulation Profile: Recent Labs  Lab 12/12/21 0750 12/13/21 0558 12/14/21 0828 12/15/21 0559 12/16/21 0515  INR 1.7* 1.7* 1.9* 2.1* 2.3*    Cardiac Enzymes: No results for  input(s): CKTOTAL, CKMB, CKMBINDEX, TROPONINI in the last 168 hours.  HbA1C: Hgb A1c MFr Bld  Date/Time Value Ref Range Status  09/20/2021 06:13 AM 6.0 (H) 4.8 - 5.6 % Final    Comment:    (NOTE) Pre diabetes:          5.7%-6.4%  Diabetes:              >6.4%  Glycemic control for   <7.0% adults with diabetes   06/03/2021 03:54 AM 5.9 (H) 4.8 - 5.6 % Final    Comment:    (NOTE) Pre diabetes:          5.7%-6.4%  Diabetes:              >6.4%  Glycemic control for   <7.0% adults with diabetes     CBG: Recent Labs  Lab 12/15/21 2026 12/16/21 0746 12/16/21 1148 12/19/21 0030 12/19/21 0149  GLUCAP 165* 117* 130* 112* 76    Review of Systems:   Bolds are positive  Constitutional: weight loss, gain, night sweats, Fevers, chills, fatigue . Appetite okay  HEENT: headaches, Sore throat, sneezing, nasal congestion, post nasal drip, Difficulty swallowing, Tooth/dental problems, visual complaints visual changes, ear ache CV:  chest pain, Orthopnea, PND, swelling in lower extremities,  dizziness, palpitations, syncope.  GI  heartburn, indigestion, abdominal pain, nausea, vomiting, +diarrhea, no change in bowel habits, bloody stools.  Resp: cough, , hemoptysis, dyspnea, chest pain, pleuritic.  Skin: rash or itching or icterus GU: dysuria, change in color of urine, urgency or frequency. flank pain, hematuria  MS: joint pain or swelling. decreased range of motion diffuse body pain  "neck down" Psych: change in mood or affect. depression or anxiety.  Neuro: difficulty with speech, weakness, numbness, ataxia    Past Medical History:  He,  has a past medical history of Arthritis, BPH (benign prostatic hyperplasia) (11/24/2019), Chronic pain syndrome (11/24/2019), CKD (chronic kidney disease), stage IV ([place]) (11/24/2019), Diabetes mellitus without complication ([place]), Gout, Hypertension, Muscle pain, Physical deconditioning (11/24/2019), Scars, and Swelling.   Surgical History:   Past Surgical History:  Procedure Laterality Date   BELOW KNEE LEG AMPUTATION Right    ESOPHAGOGASTRODUODENOSCOPY (EGD) WITH PROPOFOL N/A 11/25/2019   Procedure: ESOPHAGOGASTRODUODENOSCOPY (EGD) WITH PROPOFOL;  Surgeon: Carol Ada, MD;  Location: [place];  Service: Endoscopy;  Laterality: N/A;   IR FLUORO GUIDE CV LINE RIGHT  09/05/2019   IR LUMBAR DISC ASPIRATION W/IMG GUIDE  11/26/2019   IR US GUIDE VASC ACCESS RIGHT  09/05/2019   RADIOLOGY WITH ANESTHESIA N/A 11/25/2019   Procedure: Thoracic and Lumbar MRI WITH ANESTHESIA;  Surgeon: Radiologist, Medication, MD;  Location: [facility];  Service: Radiology;  Laterality: N/A;     Social History:   reports that he has never smoked. He has never been exposed to tobacco smoke. He has never used smokeless tobacco. He reports that he does not drink alcohol and does not use drugs.   Family History:  His family history includes Cancer in his father; Heart disease in his mother.   Allergies Allergies  Allergen Reactions   Other Other (See Comments)    Blood pressure issues Per [facility] hospital: Pt states he can only take these pain meds or else he gets very sick: Oxycontin,morphine,demerol, and dilaudid are the only pain meds pt states he can take.     Oxymorphone Other (See Comments)    Causes kidney problems   Aspirin Nausea And Vomiting    Per [facility]   Beta Vulgaris  Nausea And Vomiting    Beets   Buspirone Nausea And Vomiting and Other  (See Comments)    Affected his head   Cabbage Nausea And Vomiting   Codeine Other (See Comments)    Unknown reaction   Dolobid [Diflunisal] Other (See Comments)    Unknown reaction   Fish Allergy Nausea And Vomiting   Fish-Derived Products Nausea And Vomiting   Methadone Other (See Comments)    Made him loopy   Pentazocine Other (See Comments)    Unknown reaction   Propoxyphene Nausea And Vomiting and Other (See Comments)    Messed with his head   Shellfish Allergy Nausea And Vomiting   Sulfa Antibiotics Hives   Sulfasalazine Other (See Comments)    Unknown reaction   Vistaril [Hydroxyzine] Nausea And Vomiting and Other (See Comments)    Didn't help   Amoxicillin Rash    Per [facility] Medications  Prior to Admission medications   Medication Sig Start Date End Date Taking? Authorizing Provider  acetaminophen (TYLENOL) 500 MG tablet Take 500 mg by mouth every 6 (six) hours as needed for moderate pain.   Yes [provider]  allopurinol (ZYLOPRIM) 100 MG tablet Take 100 mg by mouth daily.    Yes [provider]  amLODipine (NORVASC) 5 MG tablet Take 5 mg by mouth daily. 11/26/21  Yes [provider]  atorvastatin (LIPITOR) 80 MG tablet Take 1 tablet (80 mg total) by mouth daily. 09/25/20  Yes Jose Persia, MD  bisacodyl (DULCOLAX) 10 MG suppository Place 10 mg rectally daily as needed for severe constipation.   Yes [provider]  cloNIDine (CATAPRES) 0.1 MG tablet Take 0.1 mg by mouth 3 (three) times daily. 11/26/21  Yes [provider]  colchicine 0.6 MG tablet Take 0.6 mg by mouth daily. 08/21/21  Yes [provider]  ferrous sulfate 325 (65 FE) MG tablet Take 1 tablet (325 mg total) by mouth daily with breakfast. 12/15/21 01/14/22 Yes Kc, Maren Beach, MD  finasteride (PROSCAR) 5 MG tablet Take 1 tablet (5 mg total) by mouth daily. 10/10/19  Yes Hosie Poisson, MD  furosemide (LASIX) 40 MG tablet Take 40 mg by mouth  daily. 11/26/21  Yes [provider]  gabapentin (NEURONTIN) 300 MG capsule Take 1 capsule (300 mg total) by mouth at bedtime. Patient taking differently: Take 300 mg by mouth 3 (three) times daily. 06/06/21  Yes Nita Sells, MD  hydrALAZINE (APRESOLINE) 100 MG tablet Take 1 tablet by mouth every 8 hours. Patient taking differently: Take 100 mg by mouth every 8 (eight) hours as needed (high blood pressure >150/100). 10/01/21 12/19/22 Yes Kayleen Memos, DO  levothyroxine (SYNTHROID) 50 MCG tablet Take 50 mcg by mouth daily before breakfast.    Yes [provider]  metoprolol tartrate (LOPRESSOR) 100 MG tablet Take 0.5 tablets (50 mg total) by mouth daily. Patient taking differently: Take 50 mg by mouth 2 (two) times daily. 10/23/21  Yes Georgette Shell, MD  MOVANTIK 25 MG TABS tablet Take 25 mg by mouth daily. 11/26/21  Yes [provider]  OXYCONTIN 30 MG 12 hr tablet Take 1 tablet (30 mg total) by mouth 2 (two) times daily. Patient taking differently: Take 30 mg by mouth every 8 (eight) hours as needed (pain). 10/14/21  Yes Danford, Suann Larry, MD  polyethylene glycol (MIRALAX) 17 g packet Take 17 g by mouth daily as needed. Patient taking differently: Take 17 g by mouth daily as  needed (constipation). 10/01/21  Yes Hall, Carole N, DO  senna-docusate (SENOKOT-S) 8.6-50 MG tablet Take 1 tablet by mouth 2 (two) times daily for 7 days. 12/14/21 12/21/21 Yes Antonieta Pert, MD  tamsulosin (FLOMAX) 0.4 MG CAPS capsule Take 1 capsule (0.4 mg total) by mouth daily. 09/25/20  Yes Jose Persia, MD  tiZANidine (ZANAFLEX) 4 MG tablet Take 4 mg by mouth 3 (three) times daily. 11/26/21  Yes [provider]  warfarin (COUMADIN) 2.5 MG tablet Take 3 tablets (7.5 mg total) by mouth one time only at 4 PM. INR check in 3 days and adjust coumadin dose for INR of 2-3 Patient taking differently: Take 5 mg by mouth one time only at 4 PM. INR check in 3 days and adjust coumadin dose  for INR of 2-3 12/14/21 01/13/22 Yes Kc, Maren Beach, MD  pantoprazole (PROTONIX) 40 MG tablet Take 1 tablet by mouth 2 times daily. Patient not taking: Reported on 12/07/2021 10/01/21 10/31/21  Kayleen Memos, DO     Critical care time: 60 minutes     Critical care time was exclusive of separately billable procedures and treating other patients.  Critical care was necessary to treat or prevent imminent or life-threatening deterioration.  Critical care was time spent personally by me on the following activities: development of treatment plan with patient and/or surrogate as well as nursing, discussions with consultants, evaluation of patient's response to treatment, examination of patient, obtaining history from patient or surrogate, ordering and performing treatments and interventions, ordering and review of laboratory studies, ordering and review of radiographic studies, pulse oximetry and re-evaluation of patient's condition.

## 2021-12-19 NOTE — Progress Notes (Signed)
eLink Physician-Brief Progress Note ?Patient Name: Thomas [name] ?DOB: November 24, 1950 ?MRN: [id number] ? ? ?Date of Service ? 12/19/2021  ?HPI/Events of Note ? K 6.1 Ca 7.0 Cr 5.55  ?eICU Interventions ? Temporize K with insulin and dextrose. Lokelma scheduled ?Trend K q4h ? ?Replete Ca  ? ? ? ?  ? ?Roselin Wiemann Rodman Pickle ?12/19/2021, 6:59 AM ?

## 2021-12-19 NOTE — Progress Notes (Signed)
Patient transported to CT w/ RN Lattie Haw and transport team. Provider aware. Vital signs stable prior to transport ?

## 2021-12-19 NOTE — Consult Note (Signed)
WOC Nurse Consult Note: consult performed remotely after review of medical record and consultation with Bedside RN, D. Allen.  PAtient was seen by my associate M. Austin on 12/09/21 during his last admission and I will implement the previous POC.  He was also seen by Vascular Surgery (Dr. Virl Cagey) on 12/08/21. ? ?Reason for Consult: Venous insufficiency (chronic) with weeping dermatitis ?Wound type: Venous insufficiency with small wound Clinical CEAP 6 ?Pressure Injury POA: N/A ?Measurement: 0.5cm round ?Wound DVO:UZHQ, moist ?Drainage (amount, consistency, odor) Weeping (serous)  ?Periwound: erythematous, edematous ?Dressing procedure/placement/frequency: Patient was wearing Unna's Boots that were changed twice weekly during his last admission. He was discharged on 12/16/21 and readmitted on 12/18/21. ? ?Orders placed for topical care using xeroform gauze and silicone foam and for the placement of Unna's Boots to the RLE.  Ortho Tech to change twice weekly while in house. ?A silicone foam dressing is to be placed to the sacrum for PI prevention. ? ?[place] nursing team will not follow, but will remain available to this patient, the nursing and medical teams.  Please re-consult if needed. ?Thanks, ?Maudie Flakes, MSN, RN, [place], [place], CWON-AP, [place]  ?Pager# [phone]  ? ? ? ? ?  ?

## 2021-12-20 DIAGNOSIS — I5042 Chronic combined systolic (congestive) and diastolic (congestive) heart failure: Secondary | ICD-10-CM

## 2021-12-20 LAB — CBC
HCT: 27.7 % — ABNORMAL LOW (ref 39.0–52.0)
Hemoglobin: 8.5 g/dL — ABNORMAL LOW (ref 13.0–17.0)
MCH: 32.2 pg (ref 26.0–34.0)
MCHC: 30.7 g/dL (ref 30.0–36.0)
MCV: 104.9 fL — ABNORMAL HIGH (ref 80.0–100.0)
Platelets: 181 10*3/uL (ref 150–400)
RBC: 2.64 MIL/uL — ABNORMAL LOW (ref 4.22–5.81)
RDW: 16.1 % — ABNORMAL HIGH (ref 11.5–15.5)
WBC: 6.3 10*3/uL (ref 4.0–10.5)
nRBC: 0 % (ref 0.0–0.2)

## 2021-12-20 LAB — BASIC METABOLIC PANEL
Anion gap: 10 (ref 5–15)
BUN: 79 mg/dL — ABNORMAL HIGH (ref 8–23)
CO2: 23 mmol/L (ref 22–32)
Calcium: 7.2 mg/dL — ABNORMAL LOW (ref 8.9–10.3)
Chloride: 101 mmol/L (ref 98–111)
Creatinine, Ser: 5.13 mg/dL — ABNORMAL HIGH (ref 0.61–1.24)
GFR, Estimated: 12 mL/min — ABNORMAL LOW (ref >60)
Glucose, Bld: 146 mg/dL — ABNORMAL HIGH (ref 70–99)
Potassium: 5.5 mmol/L — ABNORMAL HIGH (ref 3.5–5.1)
Sodium: 134 mmol/L — ABNORMAL LOW (ref 135–145)

## 2021-12-20 LAB — GLUCOSE, CAPILLARY
Glucose-Capillary: 129 mg/dL — ABNORMAL HIGH (ref 70–99)
Glucose-Capillary: 122 mg/dL — ABNORMAL HIGH (ref 70–99)
Glucose-Capillary: 145 mg/dL — ABNORMAL HIGH (ref 70–99)
Glucose-Capillary: 134 mg/dL — ABNORMAL HIGH (ref 70–99)
Glucose-Capillary: 145 mg/dL — ABNORMAL HIGH (ref 70–99)

## 2021-12-20 LAB — PROTIME-INR
INR: 2.1 — ABNORMAL HIGH (ref 0.8–1.2)
Prothrombin Time: 23.8 seconds — ABNORMAL HIGH (ref 11.4–15.2)

## 2021-12-20 LAB — POTASSIUM
Potassium: 5.5 mmol/L — ABNORMAL HIGH (ref 3.5–5.1)
Potassium: 5.2 mmol/L — ABNORMAL HIGH (ref 3.5–5.1)
Potassium: 5 mmol/L (ref 3.5–5.1)

## 2021-12-20 LAB — PHOSPHORUS: Phosphorus: 9.6 mg/dL — ABNORMAL HIGH (ref 2.5–4.6)

## 2021-12-20 LAB — MAGNESIUM: Magnesium: 2.3 mg/dL (ref 1.7–2.4)

## 2021-12-20 MED ORDER — LACTULOSE 10 GM/15ML PO SOLN
10.0000 g | Freq: Two times a day (BID) | ORAL | Status: DC | PRN
Start: 2021-12-20 — End: 2021-12-21
  Administered 2021-12-21: 10 g via ORAL
  Filled 2021-12-20: qty 15

## 2021-12-20 MED ORDER — DEXTROSE 50 % IV SOLN
1.0000 | Freq: Once | INTRAVENOUS | Status: AC
  Administered 2021-12-20: 50 mL via INTRAVENOUS
  Filled 2021-12-20: qty 50

## 2021-12-20 MED ORDER — WARFARIN SODIUM 5 MG PO TABS
7.5000 mg | ORAL_TABLET | Freq: Once | ORAL | Status: AC
  Administered 2021-12-20: 7.5 mg via ORAL
  Filled 2021-12-20 (×2): qty 1

## 2021-12-20 MED ORDER — OXYCODONE HCL 5 MG PO TABS
5.0000 mg | ORAL_TABLET | ORAL | Status: DC | PRN
  Administered 2021-12-20 – 2021-12-21 (×4): 5 mg via ORAL
  Filled 2021-12-20 (×5): qty 1

## 2021-12-20 MED ORDER — INSULIN ASPART 100 UNIT/ML IV SOLN
5.0000 [IU] | Freq: Once | INTRAVENOUS | Status: AC
  Administered 2021-12-20: 5 [IU] via INTRAVENOUS

## 2021-12-20 MED ORDER — FUROSEMIDE 10 MG/ML IJ SOLN
80.0000 mg | Freq: Once | INTRAMUSCULAR | Status: AC
  Administered 2021-12-20: 80 mg via INTRAVENOUS
  Filled 2021-12-20: qty 8

## 2021-12-20 MED ORDER — DOCUSATE SODIUM 50 MG/5ML PO LIQD
100.0000 mg | Freq: Two times a day (BID) | ORAL | Status: DC | PRN
  Administered 2021-12-21 – 2021-12-22 (×2): 100 mg via ORAL
  Filled 2021-12-20 (×2): qty 10

## 2021-12-20 NOTE — Progress Notes (Signed)
eLink Physician-Brief Progress Note ?Patient Name: Thomas [name] ?DOB: 1951/03/07 ?MRN: [id number] ? ? ?Date of Service ? 12/20/2021  ?HPI/Events of Note ? Patient with chronic pain. On oxycontin 30 mg TID at home. Currently PRN 5 mg q6h ordered  ?eICU Interventions ? Change oxycodone 5 mg q6h to q4h PRN  ? ? ? ?Intervention Category ?Intermediate Interventions: Pain - evaluation and management ? ?Latrel Szymczak Rodman Pickle ?12/20/2021, 1:21 AM ?

## 2021-12-20 NOTE — TOC Initial Note (Signed)
Transition of Care (TOC) - Initial/Assessment Note  ? ?Patient Details  ?Name: Thomas [name] ?MRN: [id number] ?Date of Birth: 14-Jun-1951 ? ?Transition of Care (TOC) CM/SW Contact:    ?Sherie Don, LCSW ?Phone Number: ?12/20/2021, 1:10 PM ? ?Clinical Narrative: Per chart review, patient was recently discharged home with [facility] (PT/OT/RN) through [facility] and will need new orders at discharge. ? ?Expected Discharge Plan: [place] ?Barriers to Discharge: Continued Medical Work up ? ?Patient Goals and CMS Choice ?CMS Medicare.gov Compare Post Acute Care list provided to:: Patient ?Choice offered to / list presented to : Patient ? ?Expected Discharge Plan and Services ?Expected Discharge Plan: [place] ?In-house Referral: Clinical Social Work ?Post Acute Care Choice: Home Health ?Living arrangements for the past 2 months: Apartment ? ?Prior Living Arrangements/Services ?Living arrangements for the past 2 months: Apartment ?Lives with:: Self ?Current home services: Home OT, Home PT, Home RN (Patient is active with [facility] for [facility].) ? ?Emotional Assessment ?Orientation: : Oriented to Self, Oriented to Place, Oriented to  Time, Oriented to Situation ?Alcohol / Substance Use: Not Applicable ?Psych Involvement: No (comment) ? ?Admission diagnosis:  Hyperkalemia [E87.5] ?AKI (acute kidney injury) ([place]) [N17.9] ?Acute renal failure superimposed on chronic kidney disease, unspecified CKD stage, unspecified acute renal failure type ([place]) [N17.9, N18.9] ?Patient Active Problem List  ? Diagnosis Date Noted  ? SVT (supraventricular tachycardia) ([place]) 12/11/2021  ? Obesity, Class I, BMI 30-34.9 12/10/2021  ? Left leg cellulitis 12/07/2021  ? Acute metabolic encephalopathy 71/69/6789  ? Hyperkalemia 10/09/2021  ? Hypothyroidism 10/09/2021  ? Hydronephrosis of right kidney 10/09/2021  ? Hypoglycemia 10/07/2021  ? DVT (deep venous thrombosis) ([place]) 10/07/2021  ? Acute renal failure superimposed on chronic  kidney disease ([place]) 09/17/2021  ? CAP (community acquired pneumonia) 09/17/2021  ? Cellulitis of left lower extremity   ? Pressure injury of skin 06/03/2021  ? AKI (acute kidney injury) ([place]) 06/02/2021  ? Pain management   ? Cord compression [facility])   ? Drug-induced constipation   ? Acute midline low back pain with sciatica   ? 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 Urinary retention 12/04/2019  ? BPH (benign prostatic hyperplasia) 11/24/2019  ? CKD (chronic kidney disease), stage IV ([place]) 11/24/2019  ? Chronic pain syndrome 11/24/2019  ? Physical deconditioning 11/24/2019  ? 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[place]) 08/31/2019  ? Pulmonary emboli ([place]) 09/29/2018  ? [name], right ([place]) 09/29/2018  ? [name], type 2) ([place]) 09/29/2018  ? Gout 09/29/2018  ? HTN (hypertension) 09/29/2018  ? Chronic combined systolic and diastolic congestive heart failure ([place]) 09/29/2018  ? GERD (gastroesophageal reflux disease) 09/29/2018  ? ?PCP:  Antonietta Jewel, MD ?Pharmacy:   ?[facility] ?[place] [address] ?Phone: [phone] Fax: [phone] ? ?Readmission Risk Interventions ?Readmission Risk Prevention Plan 12/20/2021 12/12/2021 09/20/2021  ?Transportation Screening - Complete Complete  ?PCP or Specialist Appt within 5-7 Days - - -  ?Not Complete comments - - -  ?PCP or Specialist Appt within 3-5 Days - - Complete  ?Home Care Screening - - -  ?Medication Review (RN CM) - - -  ?[place] or Home Care Consult - - Complete  ?Social Work Consult for [place] Planning/Counseling - - Complete  ?Palliative Care Screening - - Not Applicable  ?Medication Review Press photographer) - Complete Complete  ?PCP or Specialist appointment within 3-5 days  of discharge - Complete -  ?[place] or Home Care Consult Complete Complete -  ?SW Recovery Care/Counseling Consult Complete Complete -  ?Palliative Care Screening Not Applicable Not Applicable -   ?[place] Not Applicable Patient Refused -  ?Some recent data might be hidden  ? ?

## 2021-12-20 NOTE — Progress Notes (Addendum)
? ?NAME:  Thomas [name], MRN:  [id number], DOB:  16-Jan-1951, LOS: 1 ?ADMISSION DATE:  12/18/2021, CONSULTATION DATE:  12/20/2021 ?REFERRING MD: Thamas Jaegers, CHIEF COMPLAINT:  worsening pain  ? ?History of Present Illness:  ?Pt is a 71 yo male with complex PMHx including DM2, PAD, DVT, pulm embolus, & CKD IV presenting with complaints of generalized unwell feeling & worsening baseline tremors. He was discharged on 2/27 following admission for  LLE cellulitis, propagation of DVT in setting of eliquis failure. He was transitioned to warfarin with follow up planned for this week. In ED he was found to have worsening renal function and severe hyperkalemia of 7.2. He had slightly peaked T waves and ICU called for admission. Nephrology consulted in ED, no plans for emergent/urgent HD.  He reports gradually worsening over last ew days. Taking meds as RX. No OTC meds or supplements. Says appetite is okay but doesn't get much down when in so much pain. Says pain is "everywhere from neck down."    ? ?Pertinent  Medical History  ?  Left leg cellulitis ?  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  SVT (supraventricular tachycardia) ([place]) ?  [name], right ([place]) ?  [name], type 2) ([employer]) ?  Gout ?  HTN (hypertension) ?  HTN Emergency /possible PRES  ?  Chronic combined systolic and diastolic CHF (congestive heart failure) ([place]) ?  GERD (gastroesophageal reflux disease) ?  BPH (benign prostatic hyperplasia) ?  CKD (chronic kidney disease), stage IV ([place]) ?  Chronic pain syndrome ?  Physical deconditioning ?  Hypothyroidism ?  Obesity, Class I, BMI 30-34.9 ? ?Significant Hospital Events: ?Including procedures, antibiotic start and stop dates in addition to other pertinent events   ?2/27 discharged home (admitted 2/18-2/26)  ? ?Interim History / Subjective:  ?K improving gradually with temporizing measures + Furosemide.  ?Feels a bit weak but overall doing fine. ?Vitals stable. ? ?Objective    ?Blood pressure (!) 141/76, pulse 66, temperature (!) 97.5 ?F (36.4 ?C), temperature source Oral, resp. rate (!) 24, height 6\' 5"  (1.956 m), weight 115.5 kg, SpO2 97 %. ?   ?   ? ?Intake/Output Summary (Last 24 hours) at 12/20/2021 0913 ?Last data filed at 12/20/2021 0600 ?Gross per 24 hour  ?Intake 1131 ml  ?Output 4515 ml  ?Net -3384 ml  ? ?Filed Weights  ? 12/18/21 2120 12/19/21 0500 12/20/21 0500  ?Weight: 121.7 kg 120.9 kg 115.5 kg  ? ? ?Examination: ?General: Adult male, resting in bed watching TV, in NAD. ?Neuro: A&O x 3, no deficits. ?HEENT: [place]/AT. Sclerae anicteric. EOMI. ?Cardiovascular: RRR, no M/R/G.  ?Lungs: Respirations even and unlabored.  CTA bilaterally, No W/R/R.  ?Abdomen: Obese. BS x 4, soft, NT/ND.  ?Musculoskeletal: R BKA, LLE with gauze dressing. 1+ edema.  ?Skin: Intact, warm, no rashes. ? ?Assessment & Plan:  ? ?Hyperkalemia - in setting of AKI on CKD.  Gradually improving following multiple rounds temporizing measures + Furosemide. ?- Additional 80mg  Lasix this afternoon at 1300. ?- Follow K, repeat ordered for 1100. ?- Nephrology following. ?- Was on lokelma prior to DC but not continued at time of DC, suspect will need long term. ? ?AKI on CKD 4 - unclear etiology of CKD 4, right hydro clearly chronic and unchanged with foley placement - has many RF including DM and CRS, HTN. Suspect acute injury combination of poor PO intake, CKD and lack of lokelma. Given IVC filter in  place and worsening DVT recently, renal vasc u/s was checked to ensure adequate flow and this was reassuring/negative.  The kidney size on [place] is not really consistent with a chronic vascular problem.  ?- Lasix 80mg  IV x 1 (on home Lasix 40mg  PO daily). ?- keep foley for strict I/O, ensure complete bladder emptying - making >50 ml/hr after foley placed.  ? ?Chronic Right hydronephrosis - seen by urology briefly inpt in Dec. - noted cortical thinning c/w chronic hydro , ?stricture, u/l hydro not typical of BPH. ?- Monitor,  consider getting urology input while inpatient vs close follow up after discharge (favor outpatient follow up after review or urology notes stating not much benefit felt to be provided with any drainage options). ? ?DVT, PE - on coumadin.  ?- Coumadin per pharmacy. ? ?Chronic pain.  ?- Continue oxy PRN. ?- Continue home gaba at renal dosing.  ? ?HTN; Chronic systolic, diastolic CHF - 50/53 Echo: EF 35%, global hypokineses, LVH, RV mod enlarged, mPAP 34 mmHG, dilated ascending aortia 4.2 cm.   ?- Continue home Amlodipine, Clonidine. ? ?DM2 - not on meds at home.  ?- trend accuchecks, SSI if needed.  ? ?GERD.  ?- Continue home PPI.  ? ?Hypothyroidism. ?- Continue home synthroid. ? ?Repeat K ordered for 1100, anticipate further improvement.  Likely 1 more dose Furosemide 80mg  at 1300 but will review K level first. ?Can transfer to SDU and [place] for AM 3/4 with PCCM off.  Will discuss with TRH flow manager for [place] pickup. ? ? ?Best Practice (right click and "Reselect all SmartList Selections" daily)  ? ?Diet/type: Regular consistency (see orders) ?DVT prophylaxis: coumadin per pharmacy  ?GI prophylaxis: PPI home med  ?Lines: N/A ?Foley:  Yes, and it is still needed ?Code Status:  full code ?Last date of multidisciplinary goals of care discussion []  ? ? ?Critical care time: N/A.  ? ? ?Montey Hora, PA - C ?[facility] ?For pager details, please see AMION or use Epic chat  ?After 1900, please call [facility] for cross coverage needs ?12/20/2021, 9:13 AM ? ?Attestation done but when attempted to addend it signed this note the attestation no longer available to addend.  I, Dr. Shellee Milo, have personally reviewed patient's available data, including medical history, events of note, physical examination and test results as part of my evaluation. I have discussed with PA Shearon Stalls   and other care providers all aspects of the patients PCCM issues as listed.  In addition,  I have personally evaluated the  patient and assisted in the formulation of the management plan.    ? ?Time spent 20 min  ? ?Christinia Gully, MD ?Pulmonary and Critical Care Medicine ?[place] ?Cell [phone]  ? ?After 7:00 pm call [place]  [phone]  ? ?

## 2021-12-20 NOTE — Progress Notes (Signed)
eLink Physician-Brief Progress Note ?Patient Name: Thomas [name] ?DOB: 10-12-1951 ?MRN: [id number] ? ? ?Date of Service ? 12/20/2021  ?HPI/Events of Note ? K 5.5 Cr 5.13  ?Cr slightly improving ?On scheduled lokelma and s/p diuresis. Nex lasix scheduled this am  ?eICU Interventions ? Temporize with insulin/dextrose  ? ? ? ?Intervention Category ?Intermediate Interventions: Electrolyte abnormality - evaluation and management ? ?Jacquise Rarick Rodman Pickle ?12/20/2021, 4:50 AM ?

## 2021-12-20 NOTE — Progress Notes (Signed)
[facility] Progress Note    Assessment/ Plan:    AKI on CKD: baseline 2.4, up to 5.5.  Suspect possible retention +/- hemodynamic issues although not evident here.  Duplex not reflective of embolus.               - agree with Foley placement- keep for now             - aggressive treatment of hyperkalemia- much better at 5.2             - no indication for dialysis as of now   2.  Hyperkalemia:             - lokelma TID             - s/p calcium             - renal diet             - give amp of bicarb             - Lasix IV x 2             - treatment of potential bladder retention- Foley             - serial K measurements   3. Chronic systolic CHF:             - getting IV Lasix   4.  Chronic R hydro: agree with urology input. Fine for outpt followup   5.  Dispo: pending  Subjective:    Seen in room.  Robust output to Lasix.  Cr inching down.  K is now 5.2.     Objective:   BP (!) 141/76    Pulse 66    Temp 97.7 F (36.5 C) (Oral)    Resp (!) 24    Ht 6\' 5"  (1.956 m)    Wt 115.5 kg    SpO2 97%    BMI 30.19 kg/m   Intake/Output Summary (Last 24 hours) at 12/20/2021 1445 Last data filed at 12/20/2021 1400 Gross per 24 hour  Intake 777 ml  Output 5665 ml  Net -4888 ml   Weight change: -6.2 kg  Physical Exam: GEN NAD, sitting in bed, intermittently falling asleep HEENT EOMI PERRL  NECK + JVD PULM clear CV RRR ABD soft, nontender EXT 1+ Le edema, s/p R BKA NEURO Aao x 3  Imaging: CT HEAD WO CONTRAST (5MM)  Result Date: 12/19/2021 CLINICAL DATA:  71 year old male with altered mental status. EXAM: CT HEAD WITHOUT CONTRAST TECHNIQUE: Contiguous axial images were obtained from the base of the skull through the vertex without intravenous contrast. RADIATION DOSE REDUCTION: This exam was performed according to the departmental dose-optimization program which includes automated exposure control, adjustment of the mA and/or kV according to patient size  and/or use of iterative reconstruction technique. COMPARISON:  Head CT 10/07/2021 and earlier. FINDINGS: Brain: Stable cerebral volume. No midline shift, ventriculomegaly, mass effect, evidence of mass lesion, intracranial hemorrhage or evidence of cortically based acute infarction. 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 Elsewhere gray-white matter differentiation appears normal for age. No cortical encephalomalacia identified. Vascular: Calcified atherosclerosis at the skull base. No suspicious intracranial vascular hyperdensity. Skull: No acute osseous abnormality identified. Sinuses/Orbits: Visualized paranasal sinuses and mastoids are stable and well aerated. Other: Visualized scalp soft tissues are within normal limits. Slight leftward gaze, otherwise negative visible orbits. IMPRESSION: No acute intracranial abnormality. Stable  mild for age chronic small vessel disease. Electronically Signed   By: Genevie Ann M.D.   On: 12/19/2021 05:26   US RENAL  Result Date: 12/19/2021 CLINICAL DATA:  Acute kidney injury EXAM: RENAL / URINARY TRACT ULTRASOUND COMPLETE COMPARISON:  10/08/2021 FINDINGS: Right Kidney: Renal measurements: 12.4 x 5.5 x 5.2 cm = volume: 188 mL. Severe right hydronephrosis with overlying cortical thinning. No change since prior study. Left Kidney: Renal measurements: 16.2 x 5.6 x 5.1 cm = volume: 2.4 mL. No hydronephrosis. Normal echotexture. Small cysts measuring up to 1.5 cm. Bladder: Decompressed, not visualized. Other: None. IMPRESSION: Severe chronic right hydronephrosis with overlying cortical thinning. Findings stable since prior study. Electronically Signed   By: Rolm Baptise M.D.   On: 12/19/2021 01:23   ECHOCARDIOGRAM COMPLETE  Result Date: 12/19/2021    ECHOCARDIOGRAM REPORT   Patient Name:   JENO CALLEROS Date of Exam: 12/19/2021 Medical Rec #:  [id number]       Height:       77.0 in Accession #:    [id number]      Weight:        266.5 lb Date of Birth:  05/30/1951       BSA:          2.527 m Patient Age:    30 years        BP:           120/53 mmHg Patient Gender: M               HR:           66 bpm. Exam Location:  Inpatient Procedure: 2D Echo, 3D Echo, Cardiac Doppler and Color Doppler Indications:    R94.31 Abnormal EKG  History:        Patient has prior history of Echocardiogram examinations, most                 recent 09/24/2021. CHF, Abnormal ECG, Arrythmias:SVT; Risk                 Factors:Hypertension and Diabetes. Pulmonary embolus.  Sonographer:    Roseanna Rainbow RDCS Referring Phys: [id number] ELISE L STEPHENSON  Sonographer Comments: Technically difficult study due to poor echo windows and patient is morbidly obese. Image acquisition challenging due to patient body habitus. IMPRESSIONS  1. Left ventricular ejection fraction, by estimation, is 60 to 65%. Left ventricular ejection fraction by 3D volume is 62 %. The left ventricle has normal function. The left ventricle has no regional wall motion abnormalities. There is mild left ventricular hypertrophy. Left ventricular diastolic parameters are consistent with Grade I diastolic dysfunction (impaired relaxation).  2. Right ventricular systolic function is normal. The right ventricular size is normal. There is moderately elevated pulmonary artery systolic pressure. The estimated right ventricular systolic pressure is 67.1 mmHg.  3. Left atrial size was mildly dilated.  4. The mitral valve is abnormal. Mild to moderate mitral valve regurgitation.  5. Tricuspid valve regurgitation is mild to moderate.  6. The aortic valve is tricuspid. Aortic valve regurgitation is not visualized. Aortic valve sclerosis/calcification is present, without any evidence of aortic stenosis.  7. Aortic dilatation noted. There is mild dilatation of the ascending aorta, measuring 40 mm.  8. The inferior vena cava is dilated in size with <50% respiratory variability, suggesting right atrial pressure of 15 mmHg.  Comparison(s): Changes from prior study are noted. 09/24/2021: LVEF 35-40%, moderate MR, dilated aorta to 42 mm, RVSP 64 mmHg. FINDINGS  Left Ventricle: Left ventricular ejection fraction, by estimation, is 60 to 65%. Left ventricular ejection fraction by 3D volume is 62 %. The left ventricle has normal function. The left ventricle has no regional wall motion abnormalities. The left ventricular internal cavity size was normal in size. There is mild left ventricular hypertrophy. Left ventricular diastolic parameters are consistent with Grade I diastolic dysfunction (impaired relaxation). Normal left ventricular filling pressure. Right Ventricle: The right ventricular size is normal. No increase in right ventricular wall thickness. Right ventricular systolic function is normal. There is moderately elevated pulmonary artery systolic pressure. The tricuspid regurgitant velocity is 3.29 m/s, and with an assumed right atrial pressure of 15 mmHg, the estimated right ventricular systolic pressure is 50.3 mmHg. Left Atrium: Left atrial size was mildly dilated. Right Atrium: Right atrial size was normal in size. Pericardium: There is no evidence of pericardial effusion. Mitral Valve: The mitral valve is abnormal. There is mild thickening of the mitral valve leaflet(s). Mild to moderate mitral valve regurgitation. Tricuspid Valve: The tricuspid valve is grossly normal. Tricuspid valve regurgitation is mild to moderate. Aortic Valve: The aortic valve is tricuspid. Aortic valve regurgitation is not visualized. Aortic valve sclerosis/calcification is present, without any evidence of aortic stenosis. Pulmonic Valve: The pulmonic valve was grossly normal. Pulmonic valve regurgitation is trivial. Aorta: Aortic dilatation noted. There is mild dilatation of the ascending aorta, measuring 40 mm. Venous: The inferior vena cava is dilated in size with less than 50% respiratory variability, suggesting right atrial pressure of 15 mmHg.  IAS/Shunts: No atrial level shunt detected by color flow Doppler.  LEFT VENTRICLE PLAX 2D LVIDd:         5.80 cm         Diastology LVIDs:         3.90 cm         LV e' medial:    8.81 cm/s LV PW:         1.20 cm         LV E/e' medial:  9.1 LV IVS:        1.20 cm         LV e' lateral:   13.60 cm/s LVOT diam:     2.60 cm         LV E/e' lateral: 5.9 LV SV:         131 LV SV Index:   52 LVOT Area:     5.31 cm        3D Volume EF                                LV 3D EF:    Left                                             ventricul LV Volumes (MOD)                            ar LV vol d, MOD    194.0 ml                   ejection A2C:  fraction LV vol d, MOD    154.0 ml                   by 3D A4C:                                        volume is LV vol s, MOD    63.2 ml                    62 %. A2C: LV vol s, MOD    69.3 ml A4C:                           3D Volume EF: LV SV MOD A2C:   130.8 ml      3D EF:        62 % LV SV MOD A4C:   154.0 ml      LV EDV:       248 ml LV SV MOD BP:    111.6 ml      LV ESV:       94 ml                                LV SV:        153 ml RIGHT VENTRICLE             IVC RV S prime:     13.70 cm/s  IVC diam: 2.40 cm TAPSE (M-mode): 2.0 cm LEFT ATRIUM              Index        RIGHT ATRIUM           Index LA diam:        5.40 cm  2.14 cm/m   RA Area:     18.20 cm LA Vol (A2C):   64.3 ml  25.44 ml/m  RA Volume:   53.70 ml  21.25 ml/m LA Vol (A4C):   111.0 ml 43.92 ml/m LA Biplane Vol: 88.1 ml  34.86 ml/m  AORTIC VALVE             PULMONIC VALVE LVOT Vmax:   122.00 cm/s PR End Diast Vel: 1.94 msec LVOT Vmean:  79.000 cm/s LVOT VTI:    0.246 m  AORTA Ao Root diam: 3.70 cm Ao Asc diam:  4.05 cm MITRAL VALVE               TRICUSPID VALVE MV Area (PHT): 3.12 cm    TR Peak grad:   43.3 mmHg MV Decel Time: 243 msec    TR Vmax:        329.00 cm/s MV E velocity: 80.20 cm/s MV A velocity: 96.10 cm/s  SHUNTS MV E/A ratio:  0.83        Systemic VTI:  0.25  m                            Systemic Diam: 2.60 cm Lyman Bishop MD Electronically signed by Lyman Bishop MD Signature Date/Time: 12/19/2021/1:48:52 PM    Final    VAS US RENAL ARTERY DUPLEX  Result Date: 12/19/2021 ABDOMINAL VISCERAL Patient Name:  DODD SCHMID  Date of Exam:   12/19/2021 Medical Rec #: [id number]  Accession #:    [id number] Date of Birth: Nov 19, 1950        Patient Gender: M Patient Age:   19 years Exam Location:  [facility] Procedure:      VAS US RENAL ARTERY DUPLEX Referring Phys: [id number] ELISE L STEPHENSON -------------------------------------------------------------------------------- High Risk Factors: Hypertension, Diabetes, no history of smoking. Other Factors: CKD4, CHF,. Limitations: Obesity, patient discomfort and severe right side hydronephrosis. Comparison Study: No previous exams Performing Technologist: Rogelia Rohrer RVT, RDMS Supporting Technologist: Oda Cogan RDMS, RVT  Examination Guidelines: A complete evaluation includes B-mode imaging, spectral Doppler, color Doppler, and power Doppler as needed of all accessible portions of each vessel. Bilateral testing is considered an integral part of a complete examination. Limited examinations for reoccurring indications may be performed as noted.  Duplex Findings: +----------+--------+--------+------+--------+  Mesenteric PSV cm/s EDV cm/s Plaque Comments  +----------+--------+--------+------+--------+  Aorta Prox    83       13                     +----------+--------+--------+------+--------+    +-----------------+--------+--------+-------+  Left Renal Artery PSV cm/s EDV cm/s Comment  +-----------------+--------+--------+-------+  Origin              123       22             +-----------------+--------+--------+-------+  Proximal            110       16             +-----------------+--------+--------+-------+  Mid                  63       19             +-----------------+--------+--------+-------+  +------------+--------+--------+--+-----------+--------+--------+----+  Right Kidney PSV cm/s EDV cm/s RI Left Kidney PSV cm/s EDV cm/s RI    +------------+--------+--------+--+-----------+--------+--------+----+  Upper Pole                        Upper Pole  35       8        0.74  +------------+--------+--------+--+-----------+--------+--------+----+  Mid                               Mid         21       7        0.65  +------------+--------+--------+--+-----------+--------+--------+----+  Lower Pole                        Lower Pole  24       6        0.75  +------------+--------+--------+--+-----------+--------+--------+----+  Hilar                             Hilar       64       13       0.80  +------------+--------+--------+--+-----------+--------+--------+----+ +------------------+-----+------------------+-----+  Right Kidney             Left Kidney               +------------------+-----+------------------+-----+  RAR                      RAR                       +------------------+-----+------------------+-----+  RAR (manual)             RAR (manual)              +------------------+-----+------------------+-----+  Cortex                   Cortex                    +------------------+-----+------------------+-----+  Cortex thickness         Corex thickness           +------------------+-----+------------------+-----+  Kidney length (cm) 14.10 Kidney length (cm) 15.61  +------------------+-----+------------------+-----+  Summary: Renal:  Right: Right side portion of renal artery duplex is non diagnostic.        Due to body habitus and severe hydronephrosis unable to        evaluate perfusion to and within right kidney. Left:  No evidence of left renal artery stenosis. Abnormal left        Resisitve Index. LRV flow present. Mesenteric:  SMA and celiac artery not visualized on this exam.  *See table(s) above for measurements and observations.  Diagnosing physician: Jamelle Haring  Electronically signed by  Jamelle Haring on 12/19/2021 at 3:45:22 PM.    Final     Labs: BMET Recent Labs  Lab 12/18/21 2155 12/19/21 0151 12/19/21 0543 12/19/21 8786 12/19/21 1019 12/19/21 1345 12/19/21 1730 12/19/21 2204 12/20/21 0237 12/20/21 1055  NA 131* 134* 133*   132* 134*  --   --  137  --  134*  --   K 7.2* 5.5* 6.1*   6.1* 6.7* 6.2* 6.2* 5.5* 5.4* 5.5*   5.5* 5.2*  CL 101 103 103   102 104  --   --  106  --  101  --   CO2 18* 20* 21*   20* 19*  --   --  23  --  23  --   GLUCOSE 117* 79 135*   136* 113*  --   --  120*  --  146*  --   BUN 73* 75* 79*   79* 78*  --   --  73*  --  79*  --   CREATININE 5.95* 5.96* 5.55*   5.51* 5.44*  --   --  5.55*  --  5.13*  --   CALCIUM 7.2* 7.0* 7.0*   7.0* 6.8*  --   --  7.7*  --  7.2*  --   PHOS  --  9.0* 9.5*  --   --   --   --   --  9.6*  --    CBC Recent Labs  Lab 12/15/21 0559 12/18/21 2155 12/19/21 0543 12/20/21 0237  WBC 5.8 9.9 7.6 6.3  NEUTROABS  --  7.4  --   --   HGB 9.9* 9.3* 8.4* 8.5*  HCT 31.5* 31.0* 27.9* 27.7*  MCV 104.0* 106.5* 105.7* 104.9*  PLT 204 186 177 181    Medications:     acetaminophen  650 mg Oral Once   amLODipine  5 mg Oral Daily   chlorhexidine  15 mL Mouth Rinse BID   Chlorhexidine Gluconate Cloth  6 each Topical Daily   Chlorhexidine Gluconate Cloth  6 each Topical Q0600   cloNIDine  0.1 mg Oral TID   finasteride  5 mg Oral Daily   gabapentin  100 mg Oral TID   levothyroxine  50 mcg Oral Q0600   mouth rinse  15 mL Mouth Rinse q12n4p  mupirocin ointment  1 application Nasal BID   pantoprazole  40 mg Oral BID   sodium zirconium cyclosilicate  10 g Oral TID   tamsulosin  0.4 mg Oral Daily   warfarin  7.5 mg Oral ONCE-1600   Warfarin - Pharmacist Dosing Inpatient   Does not apply q1600     Madelon Lips, MD 12/20/2021, 2:45 PM

## 2021-12-20 NOTE — Progress Notes (Signed)
ANTICOAGULATION CONSULT NOTE ? ?Pharmacy Consult for Warfarin ?Indication: PE/chronic DVT (possible Eliquis failure) ? ?Allergies  ?Allergen Reactions  ? Other Other (See Comments)  ?  Blood pressure issues ?Per [facility] hospital: Pt states he can only take these pain meds or else he gets very sick: Oxycontin,morphine,demerol, and dilaudid are the only pain meds pt states he can take.  ?  ? Oxymorphone Other (See Comments)  ?  Causes kidney problems  ? Aspirin Nausea And Vomiting  ?  Per [facility]  ? Beta Vulgaris Nausea And Vomiting  ?  Beets  ? Buspirone Nausea And Vomiting and Other (See Comments)  ?  Affected his head  ? Cabbage Nausea And Vomiting  ? Codeine Other (See Comments)  ?  Unknown reaction  ? Dolobid [Diflunisal] Other (See Comments)  ?  Unknown reaction  ? Fish Allergy Nausea And Vomiting  ? Fish-Derived Products Nausea And Vomiting  ? Methadone Other (See Comments)  ?  Made him loopy  ? Pentazocine Other (See Comments)  ?  Unknown reaction  ? Propoxyphene Nausea And Vomiting and Other (See Comments)  ?  Messed with his head  ? Shellfish Allergy Nausea And Vomiting  ? Sulfa Antibiotics Hives  ? Sulfasalazine Other (See Comments)  ?  Unknown reaction  ? Vistaril [Hydroxyzine] Nausea And Vomiting and Other (See Comments)  ?  Didn't help  ? Amoxicillin Rash  ?  Per [facility]  ? ? ?Patient Measurements: ?Height: 6\' 5"  (195.6 cm) ?Weight: 115.5 kg (254 lb 10.1 oz) ?IBW/kg (Calculated) : 89.1 ?  ? ?Vital Signs: ?Temp: 98.5 ?F (36.9 ?C) (03/02 2330) ?Temp Source: Axillary (03/02 2330) ?BP: 141/76 (03/03 0600) ?Pulse Rate: 66 (03/03 0600) ? ?Labs: ?Recent Labs  ?  12/18/21 ?2155 12/19/21 ?0151 12/19/21 ?0543 12/19/21 ?9242 12/19/21 ?1730 12/20/21 ?6834  ?HGB 9.3*  --  8.4*  --   --  8.5*  ?HCT 31.0*  --  27.9*  --   --  27.7*  ?PLT 186  --  177  --   --  181  ?LABPROT  --  28.2* 27.5*  27.7*  --   --  23.8*  ?INR  --  2.7* 2.6*  2.6*  --   --  2.1*  ?CREATININE 5.95* 5.96* 5.55*  5.51*  5.44* 5.55* 5.13*  ?CKTOTAL  --   --  558*  --   --   --   ? ? ? ?Estimated Creatinine Clearance: 19.4 mL/min (A) (by C-G formula based on SCr of 5.13 mg/dL (H)). ? ? ?Medical History: ?Past Medical History:  ?Diagnosis Date  ? Arthritis   ? BPH (benign prostatic hyperplasia) 11/24/2019  ? Chronic pain syndrome 11/24/2019  ? CKD (chronic kidney disease), stage IV ([place]) 11/24/2019  ? Diabetes mellitus without complication ([place])   ? Gout   ? Hypertension   ? Muscle pain   ? Physical deconditioning 11/24/2019  ? Scars   ? Swelling   ? ? ?Medications:  ?PTA Warfarin ? ?Assessment: ?71 yr male admitted with worsening renal function and c/o worsening pain ?Recent hospitalization for LLE cellulitis and worsening DVT (discharged 12/16/21) with instructions to take warfarin 7.5mg  daily x 3 days and then to have INR checked as an outpatient ?Patient reports last warfarin taken on 12/17/21 ?INR on admission (3/2) = 2.7 ? ?Goal of Therapy:  ?INR 2-3 ?Monitor platelets by anticoagulation protocol: Yes ?  ?Plan:  ?INR 2.1 - therapeutic but with downward trend likely in setting of missed  dose 3/1 ?Continue with warfarin 7.5 mg PO tonight ?Daily PT/INR ? ?Tawnya Crook, PharmD, BCPS ?Clinical Pharmacist ?12/20/2021 7:24 AM ? ? ? ?

## 2021-12-21 DIAGNOSIS — E875 Hyperkalemia: Secondary | ICD-10-CM | POA: Diagnosis not present

## 2021-12-21 LAB — BASIC METABOLIC PANEL
Anion gap: 11 (ref 5–15)
BUN: 74 mg/dL — ABNORMAL HIGH (ref 8–23)
CO2: 26 mmol/L (ref 22–32)
Calcium: 7.4 mg/dL — ABNORMAL LOW (ref 8.9–10.3)
Chloride: 98 mmol/L (ref 98–111)
Creatinine, Ser: 4.53 mg/dL — ABNORMAL HIGH (ref 0.61–1.24)
GFR, Estimated: 13 mL/min — ABNORMAL LOW (ref >60)
Glucose, Bld: 113 mg/dL — ABNORMAL HIGH (ref 70–99)
Potassium: 4.6 mmol/L (ref 3.5–5.1)
Sodium: 135 mmol/L (ref 135–145)

## 2021-12-21 LAB — CBC
HCT: 29.2 % — ABNORMAL LOW (ref 39.0–52.0)
Hemoglobin: 9 g/dL — ABNORMAL LOW (ref 13.0–17.0)
MCH: 31.8 pg (ref 26.0–34.0)
MCHC: 30.8 g/dL (ref 30.0–36.0)
MCV: 103.2 fL — ABNORMAL HIGH (ref 80.0–100.0)
Platelets: 210 10*3/uL (ref 150–400)
RBC: 2.83 MIL/uL — ABNORMAL LOW (ref 4.22–5.81)
RDW: 15.4 % (ref 11.5–15.5)
WBC: 5.3 10*3/uL (ref 4.0–10.5)
nRBC: 0 % (ref 0.0–0.2)

## 2021-12-21 LAB — MAGNESIUM: Magnesium: 2.4 mg/dL (ref 1.7–2.4)

## 2021-12-21 LAB — GLUCOSE, CAPILLARY
Glucose-Capillary: 119 mg/dL — ABNORMAL HIGH (ref 70–99)
Glucose-Capillary: 137 mg/dL — ABNORMAL HIGH (ref 70–99)
Glucose-Capillary: 126 mg/dL — ABNORMAL HIGH (ref 70–99)
Glucose-Capillary: 121 mg/dL — ABNORMAL HIGH (ref 70–99)

## 2021-12-21 LAB — PROTIME-INR
INR: 2.2 — ABNORMAL HIGH (ref 0.8–1.2)
Prothrombin Time: 24 seconds — ABNORMAL HIGH (ref 11.4–15.2)

## 2021-12-21 LAB — PHOSPHORUS: Phosphorus: 9 mg/dL — ABNORMAL HIGH (ref 2.5–4.6)

## 2021-12-21 MED ORDER — TIZANIDINE HCL 4 MG PO TABS
4.0000 mg | ORAL_TABLET | Freq: Three times a day (TID) | ORAL | Status: DC | PRN
  Administered 2021-12-23: 4 mg via ORAL
  Filled 2021-12-21: qty 1

## 2021-12-21 MED ORDER — LACTULOSE 10 GM/15ML PO SOLN
10.0000 g | Freq: Two times a day (BID) | ORAL | Status: DC | PRN
  Administered 2021-12-21 – 2021-12-22 (×2): 10 g via ORAL
  Filled 2021-12-21 (×2): qty 15

## 2021-12-21 MED ORDER — METOPROLOL TARTRATE 50 MG PO TABS
50.0000 mg | ORAL_TABLET | Freq: Two times a day (BID) | ORAL | Status: DC
  Administered 2021-12-21 – 2021-12-24 (×7): 50 mg via ORAL
  Filled 2021-12-21 (×2): qty 1
  Filled 2021-12-21: qty 2
  Filled 2021-12-21 (×3): qty 1
  Filled 2021-12-21 (×2): qty 2

## 2021-12-21 MED ORDER — OXYCODONE HCL 5 MG PO TABS
5.0000 mg | ORAL_TABLET | ORAL | Status: DC | PRN
  Administered 2021-12-21 – 2021-12-22 (×4): 10 mg via ORAL
  Administered 2021-12-22: 5 mg via ORAL
  Administered 2021-12-22 – 2021-12-24 (×7): 10 mg via ORAL
  Filled 2021-12-21 (×13): qty 2

## 2021-12-21 MED ORDER — WARFARIN SODIUM 5 MG PO TABS
7.5000 mg | ORAL_TABLET | Freq: Once | ORAL | Status: AC
  Administered 2021-12-21: 7.5 mg via ORAL
  Filled 2021-12-21: qty 1

## 2021-12-21 MED ORDER — BISACODYL 10 MG RE SUPP
10.0000 mg | Freq: Every day | RECTAL | Status: DC
  Administered 2021-12-21 – 2021-12-22 (×2): 10 mg via RECTAL
  Filled 2021-12-21 (×2): qty 1

## 2021-12-21 MED ORDER — SODIUM ZIRCONIUM CYCLOSILICATE 10 G PO PACK
10.0000 g | PACK | Freq: Every day | ORAL | Status: DC
  Administered 2021-12-21 – 2021-12-24 (×4): 10 g via ORAL
  Filled 2021-12-21 (×4): qty 1

## 2021-12-21 MED ORDER — CLONIDINE HCL 0.1 MG PO TABS
0.1000 mg | ORAL_TABLET | Freq: Two times a day (BID) | ORAL | Status: DC
  Administered 2021-12-21 – 2021-12-23 (×4): 0.1 mg via ORAL
  Filled 2021-12-21 (×4): qty 1

## 2021-12-21 NOTE — Hospital Course (Addendum)
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 Gloriann Loan ?GI bleed coffee-ground emesis 11/2019 Dr. 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 ? hospitalization 2/18-2/26 lower extremity cellulitis, Eliquis failure with acute on chronic DVT and transition to Coumadin-hospitalization complicated by SVT and progression of kidney disease ? ?Readmit 12/19/2021 malaise worsening tremor-severe hyperkalemia 7.2 peaked T waves admitted to ICU ?Nephrology consulted-started Lokelma, Lasix ?

## 2021-12-21 NOTE — Progress Notes (Signed)
[facility] Progress Note    Assessment/ Plan:    AKI on CKD: baseline 2.4, up to 5.5 on admit.  Suspect possible retention +/- hemodynamic issues although not evident here.  Duplex not reflective of embolus.               - agree with Foley placement- keep for now             - aggressive treatment of hyperkalemia, now resolved             - no indication for dialysis as of now  - has outpt nephrology in [place].  He does not think he wants to pursue dialysis at this time- will need to continue discussions with his primary nephrologist and for now we will not plan access here   2.  Hyperkalemia: resolved now with aggressive treatment and now on lokelma 10 g daily- to continue   3. Chronic systolic CHF:             - getting IV Lasix   4.  Chronic R hydro: agree with urology input. Fine for outpt followup   5.  Dispo: pending  Subjective:    Cr down to 4.5 this AM.  Output improved and k now normalized.  Upset about his renal diet.   Objective:   BP (!) 145/82    Pulse 76    Temp 97.7 F (36.5 C) (Oral)    Resp 19    Ht 6\' 5"  (1.956 m)    Wt 115.5 kg    SpO2 100%    BMI 30.19 kg/m   Intake/Output Summary (Last 24 hours) at 12/21/2021 1145 Last data filed at 12/21/2021 0900 Gross per 24 hour  Intake 1070 ml  Output 4475 ml  Net -3405 ml   Weight change: -1.9 kg  Physical Exam: GEN NAD, sitting in bed, awake and alert  HEENT EOMI PERRL  NECK no JVD PULM clear CV RRR ABD soft, nontender EXT trace Le edema, s/p R BKA NEURO Aao x 3  Imaging: ECHOCARDIOGRAM COMPLETE  Result Date: 12/19/2021    ECHOCARDIOGRAM REPORT   Patient Name:   Thomas [name] Date of Exam: 12/19/2021 Medical Rec #:  [id number]       Height:       77.0 in Accession #:    [id number]      Weight:       266.5 lb Date of Birth:  Jul 29, 1951       BSA:          2.527 m Patient Age:    71 years        BP:           120/53 mmHg Patient Gender: M               HR:           66 bpm. Exam Location:   Inpatient Procedure: 2D Echo, 3D Echo, Cardiac Doppler and Color Doppler Indications:    R94.31 Abnormal EKG  History:        Patient has prior history of Echocardiogram examinations, most                 recent 09/24/2021. CHF, Abnormal ECG, Arrythmias:SVT; Risk                 Factors:Hypertension and Diabetes. Pulmonary embolus.  Sonographer:    Roseanna Rainbow RDCS Referring Phys: [id number] ELISE L STEPHENSON  Sonographer Comments: Technically difficult  study due to poor echo windows and patient is morbidly obese. Image acquisition challenging due to patient body habitus. IMPRESSIONS  1. Left ventricular ejection fraction, by estimation, is 60 to 65%. Left ventricular ejection fraction by 3D volume is 62 %. The left ventricle has normal function. The left ventricle has no regional wall motion abnormalities. There is mild left ventricular hypertrophy. Left ventricular diastolic parameters are consistent with Grade I diastolic dysfunction (impaired relaxation).  2. Right ventricular systolic function is normal. The right ventricular size is normal. There is moderately elevated pulmonary artery systolic pressure. The estimated right ventricular systolic pressure is 41.9 mmHg.  3. Left atrial size was mildly dilated.  4. The mitral valve is abnormal. Mild to moderate mitral valve regurgitation.  5. Tricuspid valve regurgitation is mild to moderate.  6. The aortic valve is tricuspid. Aortic valve regurgitation is not visualized. Aortic valve sclerosis/calcification is present, without any evidence of aortic stenosis.  7. Aortic dilatation noted. There is mild dilatation of the ascending aorta, measuring 40 mm.  8. The inferior vena cava is dilated in size with <50% respiratory variability, suggesting right atrial pressure of 15 mmHg. Comparison(s): Changes from prior study are noted. 09/24/2021: LVEF 35-40%, moderate MR, dilated aorta to 42 mm, RVSP 64 mmHg. FINDINGS  Left Ventricle: Left ventricular ejection fraction, by  estimation, is 60 to 65%. Left ventricular ejection fraction by 3D volume is 62 %. The left ventricle has normal function. The left ventricle has no regional wall motion abnormalities. The left ventricular internal cavity size was normal in size. There is mild left ventricular hypertrophy. Left ventricular diastolic parameters are consistent with Grade I diastolic dysfunction (impaired relaxation). Normal left ventricular filling pressure. Right Ventricle: The right ventricular size is normal. No increase in right ventricular wall thickness. Right ventricular systolic function is normal. There is moderately elevated pulmonary artery systolic pressure. The tricuspid regurgitant velocity is 3.29 m/s, and with an assumed right atrial pressure of 15 mmHg, the estimated right ventricular systolic pressure is 62.2 mmHg. Left Atrium: Left atrial size was mildly dilated. Right Atrium: Right atrial size was normal in size. Pericardium: There is no evidence of pericardial effusion. Mitral Valve: The mitral valve is abnormal. There is mild thickening of the mitral valve leaflet(s). Mild to moderate mitral valve regurgitation. Tricuspid Valve: The tricuspid valve is grossly normal. Tricuspid valve regurgitation is mild to moderate. Aortic Valve: The aortic valve is tricuspid. Aortic valve regurgitation is not visualized. Aortic valve sclerosis/calcification is present, without any evidence of aortic stenosis. Pulmonic Valve: The pulmonic valve was grossly normal. Pulmonic valve regurgitation is trivial. Aorta: Aortic dilatation noted. There is mild dilatation of the ascending aorta, measuring 40 mm. Venous: The inferior vena cava is dilated in size with less than 50% respiratory variability, suggesting right atrial pressure of 15 mmHg. IAS/Shunts: No atrial level shunt detected by color flow Doppler.  LEFT VENTRICLE PLAX 2D LVIDd:         5.80 cm         Diastology LVIDs:         3.90 cm         LV e' medial:    8.81 cm/s LV  PW:         1.20 cm         LV E/e' medial:  9.1 LV IVS:        1.20 cm         LV e' lateral:   13.60 cm/s LVOT diam:  2.60 cm         LV E/e' lateral: 5.9 LV SV:         131 LV SV Index:   52 LVOT Area:     5.31 cm        3D Volume EF                                LV 3D EF:    Left                                             ventricul LV Volumes (MOD)                            ar LV vol d, MOD    194.0 ml                   ejection A2C:                                        fraction LV vol d, MOD    154.0 ml                   by 3D A4C:                                        volume is LV vol s, MOD    63.2 ml                    62 %. A2C: LV vol s, MOD    69.3 ml A4C:                           3D Volume EF: LV SV MOD A2C:   130.8 ml      3D EF:        62 % LV SV MOD A4C:   154.0 ml      LV EDV:       248 ml LV SV MOD BP:    111.6 ml      LV ESV:       94 ml                                LV SV:        153 ml RIGHT VENTRICLE             IVC RV S prime:     13.70 cm/s  IVC diam: 2.40 cm TAPSE (M-mode): 2.0 cm LEFT ATRIUM              Index        RIGHT ATRIUM           Index LA diam:        5.40 cm  2.14 cm/m   RA Area:     18.20 cm LA Vol (A2C):   64.3 ml  25.44 ml/m  RA Volume:   53.70 ml  21.25 ml/m LA  Vol (A4C):   111.0 ml 43.92 ml/m LA Biplane Vol: 88.1 ml  34.86 ml/m  AORTIC VALVE             PULMONIC VALVE LVOT Vmax:   122.00 cm/s PR End Diast Vel: 1.94 msec LVOT Vmean:  79.000 cm/s LVOT VTI:    0.246 m  AORTA Ao Root diam: 3.70 cm Ao Asc diam:  4.05 cm MITRAL VALVE               TRICUSPID VALVE MV Area (PHT): 3.12 cm    TR Peak grad:   43.3 mmHg MV Decel Time: 243 msec    TR Vmax:        329.00 cm/s MV E velocity: 80.20 cm/s MV A velocity: 96.10 cm/s  SHUNTS MV E/A ratio:  0.83        Systemic VTI:  0.25 m                            Systemic Diam: 2.60 cm Lyman Bishop MD Electronically signed by Lyman Bishop MD Signature Date/Time: 12/19/2021/1:48:52 PM    Final     Labs: BMET Recent Labs   Lab 12/18/21 2155 12/19/21 0151 12/19/21 0543 12/19/21 0981 12/19/21 1019 12/19/21 1345 12/19/21 1730 12/19/21 2204 12/20/21 0237 12/20/21 1055 12/20/21 1914 12/21/21 0248  NA 131* 134* 133*   132* 134*  --   --  137  --  134*  --   --  135  K 7.2* 5.5* 6.1*   6.1* 6.7*   < > 6.2* 5.5* 5.4* 5.5*   5.5* 5.2* 5.0 4.6  CL 101 103 103   102 104  --   --  106  --  101  --   --  98  CO2 18* 20* 21*   20* 19*  --   --  23  --  23  --   --  26  GLUCOSE 117* 79 135*   136* 113*  --   --  120*  --  146*  --   --  113*  BUN 73* 75* 79*   79* 78*  --   --  73*  --  79*  --   --  74*  CREATININE 5.95* 5.96* 5.55*   5.51* 5.44*  --   --  5.55*  --  5.13*  --   --  4.53*  CALCIUM 7.2* 7.0* 7.0*   7.0* 6.8*  --   --  7.7*  --  7.2*  --   --  7.4*  PHOS  --  9.0* 9.5*  --   --   --   --   --  9.6*  --   --  9.0*   < > = values in this interval not displayed.   CBC Recent Labs  Lab 12/18/21 2155 12/19/21 0543 12/20/21 0237 12/21/21 0248  WBC 9.9 7.6 6.3 5.3  NEUTROABS 7.4  --   --   --   HGB 9.3* 8.4* 8.5* 9.0*  HCT 31.0* 27.9* 27.7* 29.2*  MCV 106.5* 105.7* 104.9* 103.2*  PLT 186 177 181 210    Medications:     acetaminophen  650 mg Oral Once   amLODipine  5 mg Oral Daily   bisacodyl  10 mg Rectal Daily   chlorhexidine  15 mL Mouth Rinse BID   Chlorhexidine Gluconate Cloth  6 each Topical Daily   Chlorhexidine Gluconate Cloth  6  each Topical Q0600   cloNIDine  0.1 mg Oral TID   finasteride  5 mg Oral Daily   gabapentin  100 mg Oral TID   levothyroxine  50 mcg Oral Q0600   mouth rinse  15 mL Mouth Rinse q12n4p   mupirocin ointment  1 application Nasal BID   pantoprazole  40 mg Oral BID   sodium zirconium cyclosilicate  10 g Oral Daily   tamsulosin  0.4 mg Oral Daily   warfarin  7.5 mg Oral ONCE-1600   Warfarin - Pharmacist Dosing Inpatient   Does not apply q1600     Madelon Lips, MD 12/21/2021, 11:45 AM

## 2021-12-21 NOTE — Progress Notes (Addendum)
PROGRESS NOTE   Thomas [name]  WCH:[id number] DOB: 1951/01/05 DOA: 12/18/2021 PCP: Antonietta Jewel, MD  Brief Narrative:  71 year old white male Baseline creatinine 3 stage IV CKD-Foll Dr. 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 Gloriann Loan GI bleed coffee-ground emesis 11/2019 Dr. 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 previously  DM TY 2 complicated by multiple admissions 12/29 through 1/3 -12/19 through 12/26-severe hypoglycemia causing encephalopathy and hospitalization   hospitalization 2/18-2/26 lower extremity cellulitis, Eliquis failure with acute on chronic DVT and transition to Coumadin-hospitalization complicated by SVT and progression of kidney disease  Readmit 12/19/2021 malaise worsening tremor-severe hyperkalemia 7.2 peaked T waves admitted to ICU Nephrology consulted-started Lokelma, Lasix  Hospital-Problem based course  AKI superimposed on CKD 5 Hyperkalemia 7.2 Appreciate nephrology input-likely multifactorial-?colchicinPTA 3 times a day-+ prior severe right hydro ureteral nephrosis will trial a voiding trial today-he understands that he may need to keep the Foley in place kidney function is slowly improving potassium is normalizing Continue finasteride 5 mg Flomax 0.5, Lokelma 10 mg daily ?  Prior homelessness-he confirms that he does have a place to stay Combined systolic diastolic heart failure EF 35% 09/2021 Clonidine 0.1 3 times daily cut back to bid and wean to off, hydralazine 50 3 times daily amlodipine 5 Doubt he will comply with 3 times daily medications-consolidate back to metoprolol 50 twice daily--unclear if prior CATH Hold Lasix  at this time Does need some amount of pressure for perfusion-goal blood pressure would actually be above 140 to prevent "normotensive " aki DVT./PE/does he have a IVC filter? Continue Coumadin per pharmacy dosing Prior GI bleed Macrocytic anemia baseline is 9-10 Monitor while on Coumadin Peripheral vascular disease with BKA right side toe amputations left side Outpatient follow-up with Dr. Lovie Macadamia secondary prevention blood pressure control etc. etc. Chronic discitis  Thoracolumbar spine Holding MS Contin-increased oxy to 5-10, continue gabapentin lower dose 100 3 times daily Resumed Zanaflex 4 every 8 as needed Needs outpatient pain management May need to consolidate to Dilaudid given his renal function in the a.m.  DVT prophylaxis: Coumadin Code Status: Presumed full Family Communication: None present Disposition:  Status is: Inpatient Remains inpatient appropriate because: Need for resolution a bit more of his renal function     Consultants:  Nephrology  Procedures: None  Antimicrobials: No   Subjective: Awake coherent  Does not like food, asking for increased pain meds-has not been out of bed-  Nursing informed me he was having some weird chest like pain however I reviewed EKG personally which shows no ST-T wave changes He does have underlying systolic and diastolic heart failure  Objective: Vitals:   12/21/21 0912 12/21/21 0915 12/21/21 1000 12/21/21 1100  BP: (!) 152/83 (!) 152/83 (!) 147/78 (!) 145/82  Pulse:  64 66 76  Resp:  (!) 9 13 19   Temp:      TempSrc:      SpO2:  100% 100% 100%  Weight:      Height:        Intake/Output Summary (Last 24 hours) at 12/21/2021 1144 Last data filed at 12/21/2021 0900 Gross per 24 hour  Intake 1070 ml  Output 4475 ml  Net -3405 ml   [employer]  12/20/21 0500 12/21/21 0326 12/21/21 0800  Weight: 115.5 kg 113.6 kg 115.5 kg    Examination:  Awake pleasant no distress EOMI NCAT no focal deficit CTA B no  rales Right lower extremity BKA stump clean Left lower extremity wrapped in Kerlix-I did not examine Abdomen soft no rebound Neurologically intact moving all 4 limbs  Data Reviewed: personally reviewed   CBC    Component Value Date/Time   WBC 5.3 12/21/2021 0248   RBC 2.83 (L) 12/21/2021 0248   HGB 9.0 (L) 12/21/2021 0248   HCT 29.2 (L) 12/21/2021 0248   PLT 210 12/21/2021 0248   MCV 103.2 (H) 12/21/2021 0248   MCH 31.8 12/21/2021 0248   MCHC 30.8 12/21/2021 0248   RDW 15.4 12/21/2021 0248   LYMPHSABS 1.3 12/18/2021 2155   MONOABS 0.8 12/18/2021 2155   EOSABS 0.3 12/18/2021 2155   BASOSABS 0.0 12/18/2021 2155   CMP Latest Ref Rng & Units 12/21/2021 12/20/2021 12/20/2021  Glucose 70 - 99 mg/dL 113(H) - -  BUN 8 - 23 mg/dL 74(H) - -  Creatinine 0.61 - 1.24 mg/dL 4.53(H) - -  Sodium 135 - 145 mmol/L 135 - -  Potassium 3.5 - 5.1 mmol/L 4.6 5.0 5.2(H)  Chloride 98 - 111 mmol/L 98 - -  CO2 22 - 32 mmol/L 26 - -  Calcium 8.9 - 10.3 mg/dL 7.4(L) - -  Total Protein 6.5 - 8.1 g/dL - - -  Total Bilirubin 0.3 - 1.2 mg/dL - - -  Alkaline Phos 38 - 126 U/L - - -  AST 15 - 41 U/L - - -  ALT 0 - 44 U/L - - -     Radiology Studies: ECHOCARDIOGRAM COMPLETE  Result Date: 12/19/2021    ECHOCARDIOGRAM REPORT   Patient Name:   Thomas [name] Date of Exam: 12/19/2021 Medical Rec #:  [id number]       Height:       77.0 in Accession #:    [id number]      Weight:       266.5 lb Date of Birth:  Sep 15, 1951       BSA:          2.527 m Patient Age:    46 years        BP:           120/53 mmHg Patient Gender: M               HR:           66 bpm. Exam Location:  Inpatient Procedure: 2D Echo, 3D Echo, Cardiac Doppler and Color Doppler Indications:    R94.31 Abnormal EKG  History:        Patient has prior history of Echocardiogram examinations, most                 recent 09/24/2021. CHF, Abnormal ECG, Arrythmias:SVT; Risk                 Factors:Hypertension and Diabetes. Pulmonary embolus.  Sonographer:    Roseanna Rainbow RDCS Referring Phys: [id number] ELISE L STEPHENSON  Sonographer Comments: Technically difficult study due to poor echo windows and patient is morbidly obese. Image acquisition challenging due to patient body habitus. IMPRESSIONS  1. Left ventricular ejection fraction, by estimation, is 60 to 65%. Left ventricular ejection fraction by 3D volume is 62 %. The left ventricle has normal function. The left ventricle has no regional wall motion abnormalities. There is mild left ventricular hypertrophy.  Left ventricular diastolic parameters are consistent with Grade I diastolic dysfunction (impaired relaxation).  2. Right ventricular systolic function is normal. The right ventricular size is normal. There is moderately elevated pulmonary artery systolic pressure. The estimated right ventricular systolic pressure is 46.9 mmHg.  3. Left atrial size was mildly dilated.  4. The mitral valve is abnormal. Mild to moderate mitral valve regurgitation.  5. Tricuspid valve regurgitation is mild to moderate.  6. The aortic valve is tricuspid. Aortic valve regurgitation is not visualized. Aortic valve sclerosis/calcification is present, without any evidence of aortic stenosis.  7. Aortic dilatation noted. There is mild dilatation of the ascending aorta, measuring 40 mm.  8. The inferior vena cava is dilated in size with <50% respiratory variability, suggesting right atrial pressure of 15 mmHg. Comparison(s): Changes from prior study are noted. 09/24/2021: LVEF 35-40%, moderate MR, dilated aorta to 42 mm, RVSP 64 mmHg. FINDINGS  Left Ventricle: Left ventricular ejection fraction, by estimation, is 60 to 65%. Left ventricular ejection fraction by 3D volume is 62 %. The left ventricle has normal function. The left ventricle has no regional wall motion abnormalities. The left ventricular internal cavity size was normal in size. There is mild left ventricular hypertrophy. Left ventricular diastolic parameters are consistent with Grade I  diastolic dysfunction (impaired relaxation). Normal left ventricular filling pressure. Right Ventricle: The right ventricular size is normal. No increase in right ventricular wall thickness. Right ventricular systolic function is normal. There is moderately elevated pulmonary artery systolic pressure. The tricuspid regurgitant velocity is 3.29 m/s, and with an assumed right atrial pressure of 15 mmHg, the estimated right ventricular systolic pressure is 62.9 mmHg. Left Atrium: Left atrial size was mildly dilated. Right Atrium: Right atrial size was normal in size. Pericardium: There is no evidence of pericardial effusion. Mitral Valve: The mitral valve is abnormal. There is mild thickening of the mitral valve leaflet(s). Mild to moderate mitral valve regurgitation. Tricuspid Valve: The tricuspid valve is grossly normal. Tricuspid valve regurgitation is mild to moderate. Aortic Valve: The aortic valve is tricuspid. Aortic valve regurgitation is not visualized. Aortic valve sclerosis/calcification is present, without any evidence of aortic stenosis. Pulmonic Valve: The pulmonic valve was grossly normal. Pulmonic valve regurgitation is trivial. Aorta: Aortic dilatation noted. There is mild dilatation of the ascending aorta, measuring 40 mm. Venous: The inferior vena cava is dilated in size with less than 50% respiratory variability, suggesting right atrial pressure of 15 mmHg. IAS/Shunts: No atrial level shunt detected by color flow Doppler.  LEFT VENTRICLE PLAX 2D LVIDd:         5.80 cm         Diastology LVIDs:         3.90 cm         LV e' medial:    8.81 cm/s LV PW:         1.20 cm         LV E/e' medial:  9.1 LV IVS:        1.20 cm         LV e' lateral:   13.60 cm/s LVOT diam:     2.60 cm         LV E/e' lateral: 5.9 LV SV:         131 LV SV Index:   52 LVOT Area:     5.31 cm        3D Volume EF  LV 3D EF:    Left                                             ventricul LV Volumes  (MOD)                            ar LV vol d, MOD    194.0 ml                   ejection A2C:                                        fraction LV vol d, MOD    154.0 ml                   by 3D A4C:                                        volume is LV vol s, MOD    63.2 ml                    62 %. A2C: LV vol s, MOD    69.3 ml A4C:                           3D Volume EF: LV SV MOD A2C:   130.8 ml      3D EF:        62 % LV SV MOD A4C:   154.0 ml      LV EDV:       248 ml LV SV MOD BP:    111.6 ml      LV ESV:       94 ml                                LV SV:        153 ml RIGHT VENTRICLE             IVC RV S prime:     13.70 cm/s  IVC diam: 2.40 cm TAPSE (M-mode): 2.0 cm LEFT ATRIUM              Index        RIGHT ATRIUM           Index LA diam:        5.40 cm  2.14 cm/m   RA Area:     18.20 cm LA Vol (A2C):   64.3 ml  25.44 ml/m  RA Volume:   53.70 ml  21.25 ml/m LA Vol (A4C):   111.0 ml 43.92 ml/m LA Biplane Vol: 88.1 ml  34.86 ml/m  AORTIC VALVE             PULMONIC VALVE LVOT Vmax:   122.00 cm/s PR End Diast Vel: 1.94 msec LVOT Vmean:  79.000 cm/s LVOT VTI:    0.246 m  AORTA Ao Root diam: 3.70 cm Ao Asc diam:  4.05 cm MITRAL VALVE  TRICUSPID VALVE MV Area (PHT): 3.12 cm    TR Peak grad:   43.3 mmHg MV Decel Time: 243 msec    TR Vmax:        329.00 cm/s MV E velocity: 80.20 cm/s MV A velocity: 96.10 cm/s  SHUNTS MV E/A ratio:  0.83        Systemic VTI:  0.25 m                            Systemic Diam: 2.60 cm Lyman Bishop MD Electronically signed by Lyman Bishop MD Signature Date/Time: 12/19/2021/1:48:52 PM    Final      Scheduled Meds:  acetaminophen  650 mg Oral Once   amLODipine  5 mg Oral Daily   bisacodyl  10 mg Rectal Daily   chlorhexidine  15 mL Mouth Rinse BID   Chlorhexidine Gluconate Cloth  6 each Topical Daily   Chlorhexidine Gluconate Cloth  6 each Topical Q0600   cloNIDine  0.1 mg Oral TID   finasteride  5 mg Oral Daily   gabapentin  100 mg Oral TID   levothyroxine  50  mcg Oral Q0600   mouth rinse  15 mL Mouth Rinse q12n4p   mupirocin ointment  1 application Nasal BID   pantoprazole  40 mg Oral BID   sodium zirconium cyclosilicate  10 g Oral Daily   tamsulosin  0.4 mg Oral Daily   warfarin  7.5 mg Oral ONCE-1600   Warfarin - Pharmacist Dosing Inpatient   Does not apply q1600   Continuous Infusions:   LOS: 2 days   Time spent: [place], MD [facility] To contact the attending provider between 7A-7P or the covering provider during after hours 7P-7A, please log into the web site [email or web address] and access using universal [place] password for that web site. If you do not have the password, please call the hospital operator.  12/21/2021, 11:44 AM

## 2021-12-21 NOTE — Progress Notes (Signed)
ANTICOAGULATION CONSULT NOTE ? ?Pharmacy Consult for Warfarin ?Indication: PE/chronic DVT (possible Eliquis failure) ? ?Allergies  ?Allergen Reactions  ? Other Other (See Comments)  ?  Blood pressure issues ?Per [facility] hospital: Pt states he can only take these pain meds or else he gets very sick: Oxycontin,morphine,demerol, and dilaudid are the only pain meds pt states he can take.  ?  ? Oxymorphone Other (See Comments)  ?  Causes kidney problems  ? Aspirin Nausea And Vomiting  ?  Per [facility]  ? Beta Vulgaris Nausea And Vomiting  ?  Beets  ? Buspirone Nausea And Vomiting and Other (See Comments)  ?  Affected his head  ? Cabbage Nausea And Vomiting  ? Codeine Other (See Comments)  ?  Unknown reaction  ? Dolobid [Diflunisal] Other (See Comments)  ?  Unknown reaction  ? Fish Allergy Nausea And Vomiting  ? Fish-Derived Products Nausea And Vomiting  ? Methadone Other (See Comments)  ?  Made him loopy  ? Pentazocine Other (See Comments)  ?  Unknown reaction  ? Propoxyphene Nausea And Vomiting and Other (See Comments)  ?  Messed with his head  ? Shellfish Allergy Nausea And Vomiting  ? Sulfa Antibiotics Hives  ? Sulfasalazine Other (See Comments)  ?  Unknown reaction  ? Vistaril [Hydroxyzine] Nausea And Vomiting and Other (See Comments)  ?  Didn't help  ? Amoxicillin Rash  ?  Per [facility]  ? ? ?Patient Measurements: ?Height: 6\' 5"  (195.6 cm) ?Weight: 113.6 kg (250 lb 7.1 oz) ?IBW/kg (Calculated) : 89.1 ?  ? ?Vital Signs: ?Temp: 97.6 ?F (36.4 ?C) (03/04 0440) ?Temp Source: Axillary (03/04 0440) ?BP: 141/68 (03/04 0600) ?Pulse Rate: 66 (03/04 0600) ? ?Labs: ?Recent Labs  ?  12/19/21 ?0543 12/19/21 ?4709 12/19/21 ?1730 12/20/21 ?6283 12/21/21 ?0248  ?HGB 8.4*  --   --  8.5* 9.0*  ?HCT 27.9*  --   --  27.7* 29.2*  ?PLT 177  --   --  181 210  ?LABPROT 27.5*  27.7*  --   --  23.8* 24.0*  ?INR 2.6*  2.6*  --   --  2.1* 2.2*  ?CREATININE 5.55*  5.51*   < > 5.55* 5.13* 4.53*  ?CKTOTAL 558*  --   --   --    --   ? < > = values in this interval not displayed.  ? ? ? ?Estimated Creatinine Clearance: 21.8 mL/min (A) (by C-G formula based on SCr of 4.53 mg/dL (H)). ? ? ?Medical History: ?Past Medical History:  ?Diagnosis Date  ? Arthritis   ? BPH (benign prostatic hyperplasia) 11/24/2019  ? Chronic pain syndrome 11/24/2019  ? CKD (chronic kidney disease), stage IV ([place]) 11/24/2019  ? Diabetes mellitus without complication ([place])   ? Gout   ? Hypertension   ? Muscle pain   ? Physical deconditioning 11/24/2019  ? Scars   ? Swelling   ? ? ?Medications:  ?PTA Warfarin ? ?Assessment: ?71 yr male admitted with worsening renal function and c/o worsening pain ?Recent hospitalization for LLE cellulitis and worsening DVT (discharged 12/16/21) with instructions to take warfarin 7.5mg  daily x 3 days and then to have INR checked as an outpatient ?Patient reports last warfarin taken on 12/17/21 ?INR on admission (3/2) = 2.7 ? ?Goal of Therapy:  ?INR 2-3 ?Monitor platelets by anticoagulation protocol: Yes ?  ?Plan:  ?INR 2.2 - therapeutic ?Continue with warfarin 7.5 mg for now (suspect this may be his maintenance dose requirement) ?Daily  PT/INR ? ?Tawnya Crook, PharmD, BCPS ?Clinical Pharmacist ?12/21/2021 8:01 AM ? ? ? ?

## 2021-12-21 NOTE — Plan of Care (Signed)

## 2021-12-22 DIAGNOSIS — E875 Hyperkalemia: Secondary | ICD-10-CM | POA: Diagnosis not present

## 2021-12-22 LAB — IRON AND TIBC
Iron: 50 ug/dL (ref 45–182)
Iron: 56 ug/dL (ref 45–182)
Saturation Ratios: 22 % (ref 17.9–39.5)
Saturation Ratios: 23 % (ref 17.9–39.5)
TIBC: 230 ug/dL — ABNORMAL LOW (ref 250–450)
TIBC: 246 ug/dL — ABNORMAL LOW (ref 250–450)
UIBC: 180 ug/dL
UIBC: 190 ug/dL

## 2021-12-22 LAB — CBC WITH DIFFERENTIAL/PLATELET
Abs Immature Granulocytes: 0.01 10*3/uL (ref 0.00–0.07)
Basophils Absolute: 0 10*3/uL (ref 0.0–0.1)
Basophils Relative: 0 %
Eosinophils Absolute: 0.4 10*3/uL (ref 0.0–0.5)
Eosinophils Relative: 7 %
HCT: 29.1 % — ABNORMAL LOW (ref 39.0–52.0)
Hemoglobin: 9.4 g/dL — ABNORMAL LOW (ref 13.0–17.0)
Immature Granulocytes: 0 %
Lymphocytes Relative: 24 %
Lymphs Abs: 1.4 10*3/uL (ref 0.7–4.0)
MCH: 32.6 pg (ref 26.0–34.0)
MCHC: 32.3 g/dL (ref 30.0–36.0)
MCV: 101 fL — ABNORMAL HIGH (ref 80.0–100.0)
Monocytes Absolute: 0.5 10*3/uL (ref 0.1–1.0)
Monocytes Relative: 8 %
Neutro Abs: 3.5 10*3/uL (ref 1.7–7.7)
Neutrophils Relative %: 61 %
Platelets: 231 10*3/uL (ref 150–400)
RBC: 2.88 MIL/uL — ABNORMAL LOW (ref 4.22–5.81)
RDW: 15.3 % (ref 11.5–15.5)
WBC: 5.9 10*3/uL (ref 4.0–10.5)
nRBC: 0 % (ref 0.0–0.2)

## 2021-12-22 LAB — PROTIME-INR
INR: 2.3 — ABNORMAL HIGH (ref 0.8–1.2)
Prothrombin Time: 25 seconds — ABNORMAL HIGH (ref 11.4–15.2)

## 2021-12-22 LAB — RENAL FUNCTION PANEL
Albumin: 3.2 g/dL — ABNORMAL LOW (ref 3.5–5.0)
Anion gap: 11 (ref 5–15)
BUN: 74 mg/dL — ABNORMAL HIGH (ref 8–23)
CO2: 26 mmol/L (ref 22–32)
Calcium: 7.7 mg/dL — ABNORMAL LOW (ref 8.9–10.3)
Chloride: 99 mmol/L (ref 98–111)
Creatinine, Ser: 3.88 mg/dL — ABNORMAL HIGH (ref 0.61–1.24)
GFR, Estimated: 16 mL/min — ABNORMAL LOW (ref >60)
Glucose, Bld: 136 mg/dL — ABNORMAL HIGH (ref 70–99)
Phosphorus: 6.9 mg/dL — ABNORMAL HIGH (ref 2.5–4.6)
Potassium: 4.3 mmol/L (ref 3.5–5.1)
Sodium: 136 mmol/L (ref 135–145)

## 2021-12-22 LAB — GLUCOSE, CAPILLARY
Glucose-Capillary: 117 mg/dL — ABNORMAL HIGH (ref 70–99)
Glucose-Capillary: 186 mg/dL — ABNORMAL HIGH (ref 70–99)
Glucose-Capillary: 172 mg/dL — ABNORMAL HIGH (ref 70–99)

## 2021-12-22 LAB — FOLATE: Folate: 8.2 ng/mL (ref >5.9)

## 2021-12-22 LAB — VITAMIN B12: Vitamin B-12: 581 pg/mL (ref 180–914)

## 2021-12-22 MED ORDER — WARFARIN SODIUM 2.5 MG PO TABS
7.5000 mg | ORAL_TABLET | Freq: Every day | ORAL | Status: DC
  Administered 2021-12-22 – 2021-12-23 (×2): 7.5 mg via ORAL
  Filled 2021-12-22 (×2): qty 1

## 2021-12-22 MED ORDER — SORBITOL 70 % SOLN
60.0000 mL | Freq: Once | Status: AC
  Administered 2021-12-22: 60 mL via ORAL
  Filled 2021-12-22: qty 60

## 2021-12-22 MED ORDER — SENNOSIDES-DOCUSATE SODIUM 8.6-50 MG PO TABS: 1.0000 | ORAL_TABLET | Freq: Once | ORAL | Status: DC

## 2021-12-22 MED ORDER — WARFARIN SODIUM 5 MG PO TABS: 7.5000 mg | ORAL_TABLET | Freq: Every day | ORAL | Status: DC

## 2021-12-22 NOTE — Progress Notes (Signed)
ANTICOAGULATION CONSULT NOTE ? ?Pharmacy Consult for Warfarin ?Indication: PE/chronic DVT (possible Eliquis failure) ? ?Allergies  ?Allergen Reactions  ? Other Other (See Comments)  ?  Blood pressure issues ?Per [facility] hospital: Pt states he can only take these pain meds or else he gets very sick: Oxycontin,morphine,demerol, and dilaudid are the only pain meds pt states he can take.  ?  ? Oxymorphone Other (See Comments)  ?  Causes kidney problems  ? Aspirin Nausea And Vomiting  ?  Per [facility]  ? Beta Vulgaris Nausea And Vomiting  ?  Beets  ? Buspirone Nausea And Vomiting and Other (See Comments)  ?  Affected his head  ? Cabbage Nausea And Vomiting  ? Codeine Other (See Comments)  ?  Unknown reaction  ? Dolobid [Diflunisal] Other (See Comments)  ?  Unknown reaction  ? Fish Allergy Nausea And Vomiting  ? Fish-Derived Products Nausea And Vomiting  ? Methadone Other (See Comments)  ?  Made him loopy  ? Pentazocine Other (See Comments)  ?  Unknown reaction  ? Propoxyphene Nausea And Vomiting and Other (See Comments)  ?  Messed with his head  ? Shellfish Allergy Nausea And Vomiting  ? Sulfa Antibiotics Hives  ? Sulfasalazine Other (See Comments)  ?  Unknown reaction  ? Vistaril [Hydroxyzine] Nausea And Vomiting and Other (See Comments)  ?  Didn't help  ? Amoxicillin Rash  ?  Per [facility]  ? ? ?Patient Measurements: ?Height: 6\' 5"  (195.6 cm) ?Weight: 115.1 kg (253 lb 12 oz) ?IBW/kg (Calculated) : 89.1 ?  ? ?Vital Signs: ?Temp: 98 ?F (36.7 ?C) (03/05 0300) ?Temp Source: Oral (03/05 0300) ?BP: 146/94 (03/05 0500) ?Pulse Rate: 58 (03/05 0500) ? ?Labs: ?Recent Labs  ?  12/20/21 ?0867 12/21/21 ?6195 12/22/21 ?0244  ?HGB 8.5* 9.0* 9.4*  ?HCT 27.7* 29.2* 29.1*  ?PLT 181 210 231  ?LABPROT 23.8* 24.0* 25.0*  ?INR 2.1* 2.2* 2.3*  ?CREATININE 5.13* 4.53* 3.88*  ? ? ? ?Estimated Creatinine Clearance: 25.6 mL/min (A) (by C-G formula based on SCr of 3.88 mg/dL (H)). ? ? ?Medical History: ?Past Medical History:   ?Diagnosis Date  ? Arthritis   ? BPH (benign prostatic hyperplasia) 11/24/2019  ? Chronic pain syndrome 11/24/2019  ? CKD (chronic kidney disease), stage IV ([place]) 11/24/2019  ? Diabetes mellitus without complication ([place])   ? Gout   ? Hypertension   ? Muscle pain   ? Physical deconditioning 11/24/2019  ? Scars   ? Swelling   ? ? ?Medications:  ?PTA Warfarin ? ?Assessment: ?71 yr male admitted with worsening renal function and c/o worsening pain ?Recent hospitalization for LLE cellulitis and worsening DVT (discharged 12/16/21) with instructions to take warfarin 7.5mg  daily x 3 days and then to have INR checked as an outpatient ?Patient reports last warfarin taken on 12/17/21 ?INR on admission (3/2) = 2.7 ? ?Goal of Therapy:  ?INR 2-3 ?Monitor platelets by anticoagulation protocol: Yes ?  ?Plan:  ?INR 2.3 - therapeutic ?Continue with warfarin 7.5 mg daily (expect this may be his maintenance dose requirement) ?Daily PT/INR ? ?Tawnya Crook, PharmD, BCPS ?Clinical Pharmacist ?12/22/2021 7:54 AM ? ? ? ?

## 2021-12-22 NOTE — Progress Notes (Signed)
Pt arrived to room 1509 via bed from the ICU. Received report from [place], Therapist, sports. Will continue to monitor.  ?

## 2021-12-22 NOTE — Progress Notes (Signed)
Orthopedic Tech Progress Note ?Patient Details:  ?Thomas [name] ?12-Sep-1951 ?[id number] ? ?Ortho Devices ?Type of Ortho Device: Ace wrap, Unna boot ?Ortho Device/Splint Location: right ?Ortho Device/Splint Interventions: Application ?  ?Post Interventions ?Patient Tolerated: Well ?Instructions Provided: Care of device ? ?[place] Pink ?12/22/2021, 9:11 AM ? ?

## 2021-12-22 NOTE — Progress Notes (Signed)
?PROGRESS NOTE ? ? ZAIYDEN STROZIER  BZJ:[id number] DOB: 1951-06-09 DOA: 12/18/2021 ?PCP: Antonietta Jewel, MD  ?Brief Narrative:  ?71 year old white male ?Baseline creatinine 3 stage IV CKD-Foll Dr. 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 Gloriann Loan ?GI bleed coffee-ground emesis 11/2019 Dr. 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 ? hospitalization 2/18-2/26 lower extremity cellulitis, Eliquis failure with acute on chronic DVT and transition to Coumadin-hospitalization complicated by SVT and progression of kidney disease ? ?Readmit 12/19/2021 malaise worsening tremor-severe hyperkalemia 7.2 peaked T waves admitted to ICU ?Nephrology consulted-started Lokelma, Lasix ? ?Hospital-Problem based course ? 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 ?Continue Coumadin per pharmacy dosing ?Prior GI bleed ?Macrocytic anemia baseline is 9-10 ?Monitor while on Coumadin ?Peripheral  vascular disease with BKA right side toe amputations left side ?Outpatient follow-up with Dr. Lovie Macadamia secondary prevention blood pressure control etc. etc. ?Wounds reviewed on 3/5 and looks actually pretty good-change dressings as per orders from wound nurse-Will need outpatient follow-up ?Chronic discitis  ?Thoracolumbar spine ?Holding MS Contin-increased oxy to 5-10, continue gabapentin lower dose 100 3 times daily ?Resumed Zanaflex 4 every 8 as needed ?Needs outpatient pain management ? ?DVT prophylaxis: Coumadin ?Code Status: Presumed full ?Family Communication: None present ?Disposition:  ?Status is: Inpatient ?Remains inpatient appropriate because: Need for resolution a bit more of his renal function ? ? ?  ?Consultants:  ?Nephrology ? ?Procedures: None ? ?Antimicrobials: No ? ? ?Subjective: ?"Doc change my diet please" ?Has abdominal pain and has not had any stool since Wednesday ? ?Pain seems moderately controlled ? ? ?Objective: ?Vitals:  ? 12/22/21 0100 12/22/21 0300 12/22/21 0400 12/22/21 0500  ?BP: (!) 154/95  135/77 (!) 146/94  ?Pulse: 63  63 (!) 58  ?Resp: 17  17 (!) 21  ?Temp:  98 ?F (36.7 ?C)    ?TempSrc:  Oral    ?SpO2: (!) 82%  90% 94%  ?Weight:    115.1 kg  ?Height:      ? ? ?Intake/Output Summary (Last 24 hours) at 12/22/2021 0716 ?Last data filed at 12/22/2021 0100 ?Gross per 24 hour  ?Intake 600 ml  ?Output 1050 ml  ?Net -450 ml  ? ? ?Filed Weights  ? 12/21/21 0326 12/21/21 0800 12/22/21 0500  ?Weight: 113.6 kg 115.5 kg 115.1 kg  ? ? ?Examination: ? ?no distress EOMI NCAT no focal deficit ?CTA B no rales ?Right lower extremity BKA stump clean ?Abdomen soft no rebound ?Neurologically intact moving all 4 limbs ?Wounds on L lower extremity ? ? ? ? ? ? ?Data Reviewed: personally reviewed  ? ?  CBC ?   ?Component Value Date/Time  ? WBC 5.9 12/22/2021 0244  ? RBC 2.88 (L) 12/22/2021 0244  ? HGB 9.4 (L) 12/22/2021 0244  ? HCT 29.1 (L) 12/22/2021 0244  ? PLT 231 12/22/2021 0244  ? MCV 101.0 (H) 12/22/2021  0244  ? MCH 32.6 12/22/2021 0244  ? MCHC 32.3 12/22/2021 0244  ? RDW 15.3 12/22/2021 0244  ? LYMPHSABS 1.4 12/22/2021 0244  ? MONOABS 0.5 12/22/2021 0244  ? EOSABS 0.4 12/22/2021 0244  ? BASOSABS 0.0 12/22/2021 0244  ? ?CMP Latest Ref Rng & Units 12/22/2021 12/21/2021 12/20/2021  ?Glucose 70 - 99 mg/dL 136(H) 113(H) -  ?BUN 8 - 23 mg/dL 74(H) 74(H) -  ?Creatinine 0.61 - 1.24 mg/dL 3.88(H) 4.53(H) -  ?Sodium 135 - 145 mmol/L 136 135 -  ?Potassium 3.5 - 5.1 mmol/L 4.3 4.6 5.0  ?Chloride 98 - 111 mmol/L 99 98 -  ?CO2 22 - 32 mmol/L 26 26 -  ?Calcium 8.9 - 10.3 mg/dL 7.7(L) 7.4(L) -  ?Total Protein 6.5 - 8.1 g/dL - - -  ?Total Bilirubin 0.3 - 1.2 mg/dL - - -  ?Alkaline Phos 38 - 126 U/L - - -  ?AST 15 - 41 U/L - - -  ?ALT 0 - 44 U/L - - -  ? ? ? ?Radiology Studies: ?No results found. ? ? ?Scheduled Meds: ? acetaminophen  650 mg Oral Once  ? amLODipine  5 mg Oral Daily  ? chlorhexidine  15 mL Mouth Rinse BID  ? Chlorhexidine Gluconate Cloth  6 each Topical Daily  ? Chlorhexidine Gluconate Cloth  6 each Topical Q0600  ? cloNIDine  0.1 mg Oral BID  ? finasteride  5 mg Oral Daily  ? gabapentin  100 mg Oral TID  ? levothyroxine  50 mcg Oral Q0600  ? mouth rinse  15 mL Mouth Rinse q12n4p  ? metoprolol tartrate  50 mg Oral BID  ? mupirocin ointment  1 application Nasal BID  ? pantoprazole  40 mg Oral BID  ? senna-docusate  1 tablet Oral Once  ? sodium zirconium cyclosilicate  10 g Oral Daily  ? sorbitol  60 mL Oral Once  ? tamsulosin  0.4 mg Oral Daily  ? warfarin  7.5 mg Oral q1600  ? Warfarin - Pharmacist Dosing Inpatient   Does not apply [id number]  ? ?Continuous Infusions: ? ? LOS: 3 days  ? ?Time spent: 46 ? ?Nita Sells, MD ?[facility] ?To contact the attending provider between 7A-7P or the covering provider during after hours 7P-7A, please log into the web site [email or web address] and access using universal [place] password for that web site. If you do not have the password, please call the hospital  operator. ? ?12/22/2021, 7:16 AM  ? ? ?

## 2021-12-23 DIAGNOSIS — E875 Hyperkalemia: Secondary | ICD-10-CM | POA: Diagnosis not present

## 2021-12-23 LAB — GLUCOSE, CAPILLARY
Glucose-Capillary: 121 mg/dL — ABNORMAL HIGH (ref 70–99)
Glucose-Capillary: 181 mg/dL — ABNORMAL HIGH (ref 70–99)
Glucose-Capillary: 119 mg/dL — ABNORMAL HIGH (ref 70–99)
Glucose-Capillary: 154 mg/dL — ABNORMAL HIGH (ref 70–99)

## 2021-12-23 LAB — RENAL FUNCTION PANEL
Albumin: 3.3 g/dL — ABNORMAL LOW (ref 3.5–5.0)
Anion gap: 10 (ref 5–15)
BUN: 63 mg/dL — ABNORMAL HIGH (ref 8–23)
CO2: 27 mmol/L (ref 22–32)
Calcium: 8.1 mg/dL — ABNORMAL LOW (ref 8.9–10.3)
Chloride: 99 mmol/L (ref 98–111)
Creatinine, Ser: 3.24 mg/dL — ABNORMAL HIGH (ref 0.61–1.24)
GFR, Estimated: 20 mL/min — ABNORMAL LOW (ref >60)
Glucose, Bld: 155 mg/dL — ABNORMAL HIGH (ref 70–99)
Phosphorus: 5.3 mg/dL — ABNORMAL HIGH (ref 2.5–4.6)
Potassium: 4.5 mmol/L (ref 3.5–5.1)
Sodium: 136 mmol/L (ref 135–145)

## 2021-12-23 LAB — PROTIME-INR
INR: 2.1 — ABNORMAL HIGH (ref 0.8–1.2)
Prothrombin Time: 23.9 seconds — ABNORMAL HIGH (ref 11.4–15.2)

## 2021-12-23 MED ORDER — ONDANSETRON 4 MG PO TBDP: 4.0000 mg | ORAL_TABLET | Freq: Three times a day (TID) | ORAL | Status: DC | PRN

## 2021-12-23 MED ORDER — HYDRALAZINE HCL 20 MG/ML IJ SOLN
5.0000 mg | Freq: Four times a day (QID) | INTRAMUSCULAR | Status: DC | PRN
  Filled 2021-12-23: qty 1

## 2021-12-23 MED ORDER — BISACODYL 10 MG RE SUPP
10.0000 mg | Freq: Once | RECTAL | Status: AC
  Administered 2021-12-23: 10 mg via RECTAL
  Filled 2021-12-23: qty 1

## 2021-12-23 NOTE — Progress Notes (Signed)
ANTICOAGULATION CONSULT NOTE ? ?Pharmacy Consult for Warfarin ?Indication: PE/chronic DVT (possible Eliquis failure) ? ?Allergies  ?Allergen Reactions  ? Other Other (See Comments)  ?  Blood pressure issues ?Per [facility] hospital: Pt states he can only take these pain meds or else he gets very sick: Oxycontin,morphine,demerol, and dilaudid are the only pain meds pt states he can take.  ?  ? Oxymorphone Other (See Comments)  ?  Causes kidney problems  ? Aspirin Nausea And Vomiting  ?  Per [facility]  ? Beta Vulgaris Nausea And Vomiting  ?  Beets  ? Buspirone Nausea And Vomiting and Other (See Comments)  ?  Affected his head  ? Cabbage Nausea And Vomiting  ? Codeine Other (See Comments)  ?  Unknown reaction  ? Dolobid [Diflunisal] Other (See Comments)  ?  Unknown reaction  ? Fish Allergy Nausea And Vomiting  ? Fish-Derived Products Nausea And Vomiting  ? Methadone Other (See Comments)  ?  Made him loopy  ? Pentazocine Other (See Comments)  ?  Unknown reaction  ? Propoxyphene Nausea And Vomiting and Other (See Comments)  ?  Messed with his head  ? Shellfish Allergy Nausea And Vomiting  ? Sulfa Antibiotics Hives  ? Sulfasalazine Other (See Comments)  ?  Unknown reaction  ? Vistaril [Hydroxyzine] Nausea And Vomiting and Other (See Comments)  ?  Didn't help  ? Amoxicillin Rash  ?  Per [facility]  ? ? ?Patient Measurements: ?Height: 6\' 5"  (195.6 cm) ?Weight: 117.8 kg (259 lb 11.2 oz) ?IBW/kg (Calculated) : 89.1 ?  ? ?Vital Signs: ?Temp: 98 ?F (36.7 ?C) (03/06 0407) ?Temp Source: Oral (03/06 0407) ?BP: 171/90 (03/06 0407) ?Pulse Rate: 61 (03/06 0407) ? ?Labs: ?Recent Labs  ?  12/21/21 ?0160 12/22/21 ?0244 12/23/21 ?0435  ?HGB 9.0* 9.4*  --   ?HCT 29.2* 29.1*  --   ?PLT 210 231  --   ?LABPROT 24.0* 25.0* 23.9*  ?INR 2.2* 2.3* 2.1*  ?CREATININE 4.53* 3.88*  --   ? ? ? ?Estimated Creatinine Clearance: 25.9 mL/min (A) (by C-G formula based on SCr of 3.88 mg/dL (H)). ? ? ?Medical History: ?Past Medical History:   ?Diagnosis Date  ? Arthritis   ? BPH (benign prostatic hyperplasia) 11/24/2019  ? Chronic pain syndrome 11/24/2019  ? CKD (chronic kidney disease), stage IV ([place]) 11/24/2019  ? Diabetes mellitus without complication ([place])   ? Gout   ? Hypertension   ? Muscle pain   ? Physical deconditioning 11/24/2019  ? Scars   ? Swelling   ? ? ?Medications:  ?PTA Warfarin ? ?Assessment: ?71 yr male admitted with worsening renal function and c/o worsening pain ?Recent hospitalization for LLE cellulitis and worsening DVT (discharged 12/16/21) with instructions to take warfarin 7.5mg  daily x 3 days and then to have INR checked as an outpatient ?Patient reports last warfarin taken on 12/17/21 ?INR on admission (3/2) = 2.7 ? ?Goal of Therapy:  ?INR 2-3 ?Monitor platelets by anticoagulation protocol: Yes ?  ?Plan:  ?INR 2.3 - therapeutic ?Continue with warfarin 7.5 mg daily (expect this may be his maintenance dose requirement) ?Daily PT/INR while inpatient ? ?Hayzel Ruberg, Julieta Bellini, PharmD, BCPS ?Clinical Pharmacist ?12/23/2021 7:09 AM ? ? ? ?

## 2021-12-23 NOTE — Progress Notes (Signed)
?PROGRESS NOTE ? ? Thomas [name]  FUX:[id number] DOB: [birth year] DOA: 12/18/2021 ?PCP: Antonietta Jewel, MD  ?Brief Narrative:  ?71 year old white male ?Baseline creatinine 3 stage IV CKD-Foll Dr. 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 Gloriann Loan ?GI bleed coffee-ground emesis 11/2019 Dr. 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 ? hospitalization 2/18-2/26 lower extremity cellulitis, Eliquis failure with acute on chronic DVT and transition to Coumadin-hospitalization complicated by SVT and progression of kidney disease ? ?Readmit 12/19/2021 malaise worsening tremor-severe hyperkalemia 7.2 peaked T waves admitted to ICU ?Nephrology consulted-started Lokelma, Lasix ? ?Hospital-Problem based course ? 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 titrate metoprolol 50 twice daily to coincide with decrease in clonidine ?Doubt strict BP med compliance- unclear if prior CATH ?Hold Lasix at this time ?blood pressure goal 130-140 to prevent "normotensive " aki ?DVT./PE/does he have a IVC filter? ?Continue Coumadin per pharmacy dosing ?Prior GI bleed ?Macrocytic anemia baseline is 9-10 ?Monitor while on  Coumadin ?Peripheral vascular disease with BKA right side toe amputations left side ?Outpatient follow-up with Dr. Lovie Macadamia secondary prevention blood pressure control etc. etc. ?Wounds reviewed on 3/5 and looks actually pretty good-change dressings as per orders from wound nurse-Will need outpatient follow-up ?Chronic discitis  ?Thoracolumbar spine ?Holding MS Contin-increased oxy to 5-10, continue gabapentin lower dose 100 3 times daily ?Resumed Zanaflex 4 every 8 as needed ?Needs outpatient pain management ?PT has treid to see x 2--he will d/c tomorrow ? ?DVT prophylaxis: Coumadin ?Code Status: Presumed full ?Family Communication: None present ?Disposition:  ?Status is: Inpatient ?Remains inpatient appropriate because: Need for resolution a bit more of his renal function ? ? ?  ?Consultants:  ?Nephrology ? ?Procedures: None ? ?Antimicrobials: No ? ? ?Subjective: ? ?Coherent awake alert had some nausea earlier today-declined therapy evaluation no chest pain eating drinking ? ? ?Objective: ?Vitals:  ? 12/23/21 0407 12/23/21 0451 12/23/21 1008 12/23/21 1416  ?BP: (!) 171/90  (!) 162/85 (!) 171/102  ?Pulse: 61   67  ?Resp: 20   19  ?Temp: 98 ?F (36.7 ?C)   98.1 ?F (36.7 ?C)  ?TempSrc: Oral   Oral  ?SpO2: 96%   94%  ?Weight:  117.8 kg    ?Height:      ? ? ?Intake/Output Summary (Last 24 hours) at 12/23/2021 1825 ?Last data filed at 12/23/2021 1733 ?Gross per 24 hour  ?Intake 1388 ml  ?Output 2315 ml  ?Net -927 ml  ? ? ?Filed Weights  ? 12/21/21 0800 12/22/21 0500 12/23/21 0451  ?Weight: 115.5 kg 115.1 kg 117.8 kg  ? ? ?Examination: ? ?no distress EOMI NCAT no focal deficit ?CTA B no rales ?No lower extremity edema ?Wounds not examined today ?Abdomen soft ? ?Data Reviewed: personally reviewed  ? ?CBC ?   ?Component Value  Date/Time  ? WBC 5.9 12/22/2021 0244  ? RBC 2.88 (L) 12/22/2021 0244  ? HGB 9.4 (L) 12/22/2021 0244  ? HCT 29.1 (L) 12/22/2021 0244  ? PLT 231 12/22/2021 0244  ? MCV 101.0 (H) 12/22/2021 0244  ? MCH 32.6  12/22/2021 0244  ? MCHC 32.3 12/22/2021 0244  ? RDW 15.3 12/22/2021 0244  ? LYMPHSABS 1.4 12/22/2021 0244  ? MONOABS 0.5 12/22/2021 0244  ? EOSABS 0.4 12/22/2021 0244  ? BASOSABS 0.0 12/22/2021 0244  ? ?CMP Latest Ref Rng & Units 12/23/2021 12/22/2021 12/21/2021  ?Glucose 70 - 99 mg/dL 155(H) 136(H) 113(H)  ?BUN 8 - 23 mg/dL 63(H) 74(H) 74(H)  ?Creatinine 0.61 - 1.24 mg/dL 3.24(H) 3.88(H) 4.53(H)  ?Sodium 135 - 145 mmol/L 136 136 135  ?Potassium 3.5 - 5.1 mmol/L 4.5 4.3 4.6  ?Chloride 98 - 111 mmol/L 99 99 98  ?CO2 22 - 32 mmol/L 27 26 26   ?Calcium 8.9 - 10.3 mg/dL 8.1(L) 7.7(L) 7.4(L)  ?Total Protein 6.5 - 8.1 g/dL - - -  ?Total Bilirubin 0.3 - 1.2 mg/dL - - -  ?Alkaline Phos 38 - 126 U/L - - -  ?AST 15 - 41 U/L - - -  ?ALT 0 - 44 U/L - - -  ? ? ? ?Radiology Studies: ?No results found. ? ? ?Scheduled Meds: ? acetaminophen  650 mg Oral Once  ? amLODipine  5 mg Oral Daily  ? chlorhexidine  15 mL Mouth Rinse BID  ? Chlorhexidine Gluconate Cloth  6 each Topical Daily  ? cloNIDine  0.1 mg Oral BID  ? finasteride  5 mg Oral Daily  ? gabapentin  100 mg Oral TID  ? levothyroxine  50 mcg Oral Q0600  ? mouth rinse  15 mL Mouth Rinse q12n4p  ? metoprolol tartrate  50 mg Oral BID  ? mupirocin ointment  1 application. Nasal BID  ? pantoprazole  40 mg Oral BID  ? senna-docusate  1 tablet Oral Once  ? sodium zirconium cyclosilicate  10 g Oral Daily  ? tamsulosin  0.4 mg Oral Daily  ? warfarin  7.5 mg Oral q1600  ? Warfarin - Pharmacist Dosing Inpatient   Does not apply [id number]  ? ?Continuous Infusions: ? ? LOS: 4 days  ? ?Time spent: 14 ? ?Nita Sells, MD ?[facility] ?To contact the attending provider between 7A-7P or the covering provider during after hours 7P-7A, please log into the web site [email or web address] and access using universal [place] password for that web site. If you do not have the password, please call the hospital operator. ? ?12/23/2021, 6:25 PM  ? ? ?

## 2021-12-23 NOTE — Progress Notes (Signed)
PT Cancellation Note ? ?Patient Details ?Name: Thomas [name] ?MRN: [id number] ?DOB: 02-Jul-1951 ? ? ?Cancelled Treatment:    Reason Eval/Treat Not Completed: Medical issues which prohibited therapy, patient declined, stating that he is now nauseated after having some lunch. Will check back later.RN aware. ?Tresa Endo PT ?Acute Rehabilitation Services ?Pager [phone] ?Office [phone] ? ? ? ?Claretha Cooper ?12/23/2021, 1:54 PM ?

## 2021-12-23 NOTE — Progress Notes (Unsigned)
[place] [phone]   [phone]  INITIAL CONSULT NOTE  Patient Care Team: Antonietta Jewel, MD as PCP - General (Internal Medicine) Adegoroye, Wynona Luna, MD as Consulting Physician (Nephrology)  Hematological/Oncological History # ***  CHIEF COMPLAINTS/PURPOSE OF CONSULTATION:  "*** "  HISTORY OF PRESENTING ILLNESS:  Thomas [name] 71 y.o. male with medical history significant for ***  On review of the previous records ***  On exam today ***  MEDICAL HISTORY:  Past Medical History:  Diagnosis Date   Arthritis    BPH (benign prostatic hyperplasia) 11/24/2019   Chronic pain syndrome 11/24/2019   CKD (chronic kidney disease), stage IV ([place]) 11/24/2019   Diabetes mellitus without complication ([place])    Gout    Hypertension    Muscle pain    Physical deconditioning 11/24/2019   Scars    Swelling     SURGICAL HISTORY: Past Surgical History:  Procedure Laterality Date   BELOW KNEE LEG AMPUTATION Right    ESOPHAGOGASTRODUODENOSCOPY (EGD) WITH PROPOFOL N/A 11/25/2019   Procedure: ESOPHAGOGASTRODUODENOSCOPY (EGD) WITH PROPOFOL;  Surgeon: Carol Ada, MD;  Location: [facility];  Service: Endoscopy;  Laterality: N/A;   IR FLUORO GUIDE CV LINE RIGHT  09/05/2019   IR LUMBAR DISC ASPIRATION W/IMG GUIDE  11/26/2019   IR US GUIDE VASC ACCESS RIGHT  09/05/2019   RADIOLOGY WITH ANESTHESIA N/A 11/25/2019   Procedure: Thoracic and Lumbar MRI WITH ANESTHESIA;  Surgeon: Radiologist, Medication, MD;  Location: [facility];  Service: Radiology;  Laterality: N/A;    SOCIAL HISTORY: Social History   Socioeconomic History   Marital status: Single    Spouse name: Not on file   Number of children: Not on file   Years of education: Not on file   Highest education level: Not on file  Occupational History   Not on file  Tobacco Use   Smoking status: Never    Passive exposure: Never   Smokeless tobacco: Never  Vaping Use   Vaping Use: Never used  Substance and  Sexual Activity   Alcohol use: No   Drug use: No   Sexual activity: Not Currently  Other Topics Concern   Not on file  Social History Narrative   Not on file   Social Determinants of Health   Financial Resource Strain: Not on file  Food Insecurity: Not on file  Transportation Needs: Not on file  Physical Activity: Not on file  Stress: Not on file  Social Connections: Not on file  Intimate Partner Violence: Not on file    FAMILY HISTORY: Family History  Problem Relation Age of Onset   Heart disease Mother    Cancer Father     ALLERGIES:  is allergic to other, oxymorphone, aspirin, beta vulgaris, buspirone, cabbage, codeine, dolobid [diflunisal], fish allergy, fish-derived products, methadone, pentazocine, propoxyphene, shellfish allergy, sulfa antibiotics, sulfasalazine, vistaril [hydroxyzine], and amoxicillin.  MEDICATIONS:  No current facility-administered medications for this visit.   No current outpatient medications on file.   Facility-Administered Medications Ordered in Other Visits  Medication Dose Route Frequency Provider Last Rate Last Admin   acetaminophen (TYLENOL) tablet 650 mg  650 mg Oral Once [place], Greggory Brandy, MD       amLODipine (NORVASC) tablet 5 mg  5 mg Oral Daily Kara Mead V, MD   5 mg at 12/23/21 1009   chlorhexidine (PERIDEX) 0.12 % solution 15 mL  15 mL Mouth Rinse BID Samara Deist, MD   15 mL at 12/23/21 1008  Chlorhexidine Gluconate Cloth 2 % PADS 6 each  6 each Topical Daily Samara Deist, MD   6 each at 12/19/21 2225   cloNIDine (CATAPRES) tablet 0.1 mg  0.1 mg Oral BID Nita Sells, MD   0.1 mg at 12/23/21 1008   finasteride (PROSCAR) tablet 5 mg  5 mg Oral Daily Samara Deist, MD   5 mg at 12/23/21 1009   gabapentin (NEURONTIN) capsule 100 mg  100 mg Oral TID Rigoberto Noel, MD   100 mg at 12/23/21 1009   levothyroxine (SYNTHROID) tablet 50 mcg  50 mcg Oral Q0600 Samara Deist, MD   50 mcg at 12/23/21 0553    MEDLINE mouth rinse  15 mL Mouth Rinse q12n4p Samara Deist, MD   15 mL at 12/22/21 1559   metoprolol tartrate (LOPRESSOR) tablet 50 mg  50 mg Oral BID Nita Sells, MD   50 mg at 12/23/21 1016   mupirocin ointment (BACTROBAN) 2 % 1 application  1 application. Nasal BID Rigoberto Noel, MD   1 application. at 12/23/21 1011   naloxone (NARCAN) injection 0.4 mg  0.4 mg Intravenous PRN Margaretha Seeds, MD   0.4 mg at 12/19/21 0348   oxyCODONE (Oxy IR/ROXICODONE) immediate release tablet 5-10 mg  5-10 mg Oral Q4H PRN Nita Sells, MD   10 mg at 12/23/21 1008   pantoprazole (PROTONIX) EC tablet 40 mg  40 mg Oral BID Samara Deist, MD   40 mg at 12/23/21 1010   senna-docusate (Senokot-S) tablet 1 tablet  1 tablet Oral Once Nita Sells, MD       sodium zirconium cyclosilicate (LOKELMA) packet 10 g  10 g Oral Daily Madelon Lips, MD   10 g at 12/23/21 1008   tamsulosin (FLOMAX) capsule 0.4 mg  0.4 mg Oral Daily Samara Deist, MD   0.4 mg at 12/23/21 1009   tiZANidine (ZANAFLEX) tablet 4 mg  4 mg Oral Q8H PRN Nita Sells, MD       warfarin (COUMADIN) tablet 7.5 mg  7.5 mg Oral q1600 Tawnya Crook, RPH   7.5 mg at 12/22/21 1559   Warfarin - Pharmacist Dosing Inpatient   Does not apply q1600 Nilda Simmer, RPH   Given at 12/20/21 1700    REVIEW OF SYSTEMS:   Constitutional: ( - ) fevers, ( - )  chills , ( - ) night sweats Eyes: ( - ) blurriness of vision, ( - ) double vision, ( - ) watery eyes Ears, nose, mouth, throat, and face: ( - ) mucositis, ( - ) sore throat Respiratory: ( - ) cough, ( - ) dyspnea, ( - ) wheezes Cardiovascular: ( - ) palpitation, ( - ) chest discomfort, ( - ) lower extremity swelling Gastrointestinal:  ( - ) nausea, ( - ) heartburn, ( - ) change in bowel habits Skin: ( - ) abnormal skin rashes Lymphatics: ( - ) new lymphadenopathy, ( - ) easy bruising Neurological: ( - ) numbness, ( - ) tingling, ( - ) new  weaknesses Behavioral/Psych: ( - ) mood change, ( - ) new changes  All other systems were reviewed with the patient and are negative.  PHYSICAL EXAMINATION: ECOG PERFORMANCE STATUS: {CHL ONC ECOG PS:[phone]}  There were no vitals filed for this visit. There were no vitals filed for this visit.  GENERAL: well appearing *** in NAD  SKIN: skin color, texture, turgor are normal, no rashes or significant lesions EYES: conjunctiva are pink  and non-injected, sclera clear OROPHARYNX: no exudate, no erythema; lips, buccal mucosa, and tongue normal  NECK: supple, non-tender LYMPH:  no palpable lymphadenopathy in the cervical, axillary or supraclavicular lymph nodes.  LUNGS: clear to auscultation and percussion with normal breathing effort HEART: regular rate & rhythm and no murmurs and no lower extremity edema ABDOMEN: soft, non-tender, non-distended, normal bowel sounds Musculoskeletal: no cyanosis of digits and no clubbing  PSYCH: alert & oriented x 3, fluent speech NEURO: no focal motor/sensory deficits  LABORATORY DATA:  I have reviewed the data as listed CBC Latest Ref Rng & Units 12/22/2021 12/21/2021 12/20/2021  WBC 4.0 - 10.5 K/uL 5.9 5.3 6.3  Hemoglobin 13.0 - 17.0 g/dL 9.4(L) 9.0(L) 8.5(L)  Hematocrit 39.0 - 52.0 % 29.1(L) 29.2(L) 27.7(L)  Platelets 150 - 400 K/uL 231 210 181    CMP Latest Ref Rng & Units 12/23/2021 12/22/2021 12/21/2021  Glucose 70 - 99 mg/dL 155(H) 136(H) 113(H)  BUN 8 - 23 mg/dL 63(H) 74(H) 74(H)  Creatinine 0.61 - 1.24 mg/dL 3.24(H) 3.88(H) 4.53(H)  Sodium 135 - 145 mmol/L 136 136 135  Potassium 3.5 - 5.1 mmol/L 4.5 4.3 4.6  Chloride 98 - 111 mmol/L 99 99 98  CO2 22 - 32 mmol/L 27 26 26   Calcium 8.9 - 10.3 mg/dL 8.1(L) 7.7(L) 7.4(L)  Total Protein 6.5 - 8.1 g/dL - - -  Total Bilirubin 0.3 - 1.2 mg/dL - - -  Alkaline Phos 38 - 126 U/L - - -  AST 15 - 41 U/L - - -  ALT 0 - 44 U/L - - -     PATHOLOGY: ***  BLOOD FILM: *** Review of the peripheral blood  smear showed normal appearing white cells with neutrophils that were appropriately lobated and granulated. There was no predominance of bi-lobed or hyper-segmented neutrophils appreciated. No Dohle bodies were noted. There was no left shifting, immature forms or blasts noted. Lymphocytes remain normal in size without any predominance of large granular lymphocytes. Red cells show no anisopoikilocytosis, macrocytes , microcytes or polychromasia. There were no schistocytes, target cells, echinocytes, acanthocytes, dacrocytes, or stomatocytes.There was no rouleaux formation, nucleated red cells, or intra-cellular inclusions noted. The platelets are normal in size, shape, and color without any clumping evident.  RADIOGRAPHIC STUDIES: I have personally reviewed the radiological images as listed and agreed with the findings in the report. DG Tibia/Fibula Left  Result Date: 12/07/2021 CLINICAL DATA:  Pain and swelling EXAM: LEFT TIBIA AND FIBULA - 2 VIEW COMPARISON:  None. FINDINGS: No recent fracture or dislocation is seen. There are no focal lytic lesions. There are no demonstrable pockets of air in the soft tissues. There is soft tissue swelling in the subcutaneous plane in the lower leg and around the left ankle. Deformity in the patella may be residual from previous injury. Degenerative changes are noted with bony spurs in the left knee, more severe in the patellofemoral compartment. IMPRESSION: No fracture or dislocation is seen. There are no opaque foreign bodies or abnormal pockets of air in the soft tissues. If there is continued clinical suspicion for osteomyelitis, follow-up three-phase bone scan or MRI may be considered. Electronically Signed   By: Elmer Picker M.D.   On: 12/07/2021 15:35   CT HEAD WO CONTRAST (5MM)  Result Date: 12/19/2021 CLINICAL DATA:  71 year old male with altered mental status. EXAM: CT HEAD WITHOUT CONTRAST TECHNIQUE: Contiguous axial images were obtained from the base of  the skull through the vertex without intravenous contrast. RADIATION DOSE REDUCTION: This  exam was performed according to the departmental dose-optimization program which includes automated exposure control, adjustment of the mA and/or kV according to patient size and/or use of iterative reconstruction technique. COMPARISON:  Head CT 10/07/2021 and earlier. FINDINGS: Brain: Stable cerebral volume. No midline shift, ventriculomegaly, mass effect, evidence of mass lesion, intracranial hemorrhage or evidence of cortically based acute infarction. 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 Elsewhere gray-white matter differentiation appears normal for age. No cortical encephalomalacia identified. Vascular: Calcified atherosclerosis at the skull base. No suspicious intracranial vascular hyperdensity. Skull: No acute osseous abnormality identified. Sinuses/Orbits: Visualized paranasal sinuses and mastoids are stable and well aerated. Other: Visualized scalp soft tissues are within normal limits. Slight leftward gaze, otherwise negative visible orbits. IMPRESSION: No acute intracranial abnormality. Stable mild for age chronic small vessel disease. Electronically Signed   By: Genevie Ann M.D.   On: 12/19/2021 05:26   US RENAL  Result Date: 12/19/2021 CLINICAL DATA:  Acute kidney injury EXAM: RENAL / URINARY TRACT ULTRASOUND COMPLETE COMPARISON:  10/08/2021 FINDINGS: Right Kidney: Renal measurements: 12.4 x 5.5 x 5.2 cm = volume: 188 mL. Severe right hydronephrosis with overlying cortical thinning. No change since prior study. Left Kidney: Renal measurements: 16.2 x 5.6 x 5.1 cm = volume: 2.4 mL. No hydronephrosis. Normal echotexture. Small cysts measuring up to 1.5 cm. Bladder: Decompressed, not visualized. Other: None. IMPRESSION: Severe chronic right hydronephrosis with overlying cortical thinning. Findings stable since prior study. Electronically Signed   By: Rolm Baptise  M.D.   On: 12/19/2021 01:23   VAS [place] ABI WITH/WO TBI  Result Date: 12/09/2021  LOWER EXTREMITY DOPPLER STUDY Patient Name:  OLEGARIO EMBERSON  Date of Exam:   12/09/2021 Medical Rec #: [id number]        Accession #:    [id number] Date of Birth: 29-Mar-1951        Patient Gender: M Patient Age:   64 years Exam [facility]  [facility] Procedure:      VAS [place] ABI WITH/WO TBI Referring Phys: Soundra Pilon AMIN --------------------------------------------------------------------------------  Indications: Ulceration, and peripheral artery disease. High Risk Factors: Hypertension, Diabetes. Other Factors: 12/07/2021 - LEFT:                - Findings consistent with age indeterminate deep vein                thrombosis                involving the left proximal and mid femoral vein.                - Findings consistent with chronic deep vein thrombosis                involving the                distal left femoral vein.                - No cystic structure found in the popliteal fossa.  Limitations: Today's exam was limited due to an open wound and bandages. Comparison Study: 11/30/2019 - Right:                   RT BKA.                    Left: Resting left ankle-brachial index indicates  noncompressible left                   lower extremity arteries. Performing Technologist: Carlos Levering RVT  Examination Guidelines: A complete evaluation includes at minimum, Doppler waveform signals and systolic blood pressure reading at the level of bilateral brachial, anterior tibial, and posterior tibial arteries, when vessel segments are accessible. Bilateral testing is considered an integral part of a complete examination. Photoelectric Plethysmograph (PPG) waveforms and toe systolic pressure readings are included as required and additional duplex testing as needed. Limited examinations for reoccurring indications may be performed as noted.  ABI Findings: +---------+------------------+-----+---------+--------+  Right      Rt Pressure (mmHg) Index Waveform  Comment   +---------+------------------+-----+---------+--------+  Brachial  181                      triphasic           +---------+------------------+-----+---------+--------+  PTA                                          BKA       +---------+------------------+-----+---------+--------+  DP                                           BKA       +---------+------------------+-----+---------+--------+  Great Toe                                    BKA       +---------+------------------+-----+---------+--------+ +--------+------------------+-----+----------+-------+  Left     Lt Pressure (mmHg) Index Waveform   Comment  +--------+------------------+-----+----------+-------+  Brachial 171                      triphasic           +--------+------------------+-----+----------+-------+  PTA      222                1.23  triphasic           +--------+------------------+-----+----------+-------+  DP       197                1.09  monophasic          +--------+------------------+-----+----------+-------+ +-------+-----------+-----------+------------+------------+  ABI/TBI Today's ABI Today's TBI Previous ABI Previous TBI  +-------+-----------+-----------+------------+------------+  Right   BKA                     BKA                        +-------+-----------+-----------+------------+------------+  Left    1.23                    [place]                         +-------+-----------+-----------+------------+------------+  Summary: Left: Resting left ankle-brachial index is within normal range. No evidence of significant left lower extremity arterial disease. Unable to obtain TBI due to great toe size.  *See table(s) above for measurements and observations.  Electronically signed by Monica Martinez MD on 12/09/2021 at 2:33:42 PM.  Final    ECHOCARDIOGRAM COMPLETE  Result Date: 12/19/2021    ECHOCARDIOGRAM REPORT   Patient Name:   CHARLEY MISKE Date of Exam: 12/19/2021 Medical Rec #:   [id number]       Height:       77.0 in Accession #:    [id number]      Weight:       266.5 lb Date of Birth:  05-14-1951       BSA:          2.527 m Patient Age:    24 years        BP:           120/53 mmHg Patient Gender: M               HR:           66 bpm. Exam Location:  Inpatient Procedure: 2D Echo, 3D Echo, Cardiac Doppler and Color Doppler Indications:    R94.31 Abnormal EKG  History:        Patient has prior history of Echocardiogram examinations, most                 recent 09/24/2021. CHF, Abnormal ECG, Arrythmias:SVT; Risk                 Factors:Hypertension and Diabetes. Pulmonary embolus.  Sonographer:    Roseanna Rainbow RDCS Referring Phys: [id number] ELISE L STEPHENSON  Sonographer Comments: Technically difficult study due to poor echo windows and patient is morbidly obese. Image acquisition challenging due to patient body habitus. IMPRESSIONS  1. Left ventricular ejection fraction, by estimation, is 60 to 65%. Left ventricular ejection fraction by 3D volume is 62 %. The left ventricle has normal function. The left ventricle has no regional wall motion abnormalities. There is mild left ventricular hypertrophy. Left ventricular diastolic parameters are consistent with Grade I diastolic dysfunction (impaired relaxation).  2. Right ventricular systolic function is normal. The right ventricular size is normal. There is moderately elevated pulmonary artery systolic pressure. The estimated right ventricular systolic pressure is 77.8 mmHg.  3. Left atrial size was mildly dilated.  4. The mitral valve is abnormal. Mild to moderate mitral valve regurgitation.  5. Tricuspid valve regurgitation is mild to moderate.  6. The aortic valve is tricuspid. Aortic valve regurgitation is not visualized. Aortic valve sclerosis/calcification is present, without any evidence of aortic stenosis.  7. Aortic dilatation noted. There is mild dilatation of the ascending aorta, measuring 40 mm.  8. The inferior vena cava is dilated in size  with <50% respiratory variability, suggesting right atrial pressure of 15 mmHg. Comparison(s): Changes from prior study are noted. 09/24/2021: LVEF 35-40%, moderate MR, dilated aorta to 42 mm, RVSP 64 mmHg. FINDINGS  Left Ventricle: Left ventricular ejection fraction, by estimation, is 60 to 65%. Left ventricular ejection fraction by 3D volume is 62 %. The left ventricle has normal function. The left ventricle has no regional wall motion abnormalities. The left ventricular internal cavity size was normal in size. There is mild left ventricular hypertrophy. Left ventricular diastolic parameters are consistent with Grade I diastolic dysfunction (impaired relaxation). Normal left ventricular filling pressure. Right Ventricle: The right ventricular size is normal. No increase in right ventricular wall thickness. Right ventricular systolic function is normal. There is moderately elevated pulmonary artery systolic pressure. The tricuspid regurgitant velocity is 3.29 m/s, and with an assumed right atrial pressure of 15 mmHg, the estimated right ventricular systolic pressure is 24.2 mmHg. Left Atrium: Left  atrial size was mildly dilated. Right Atrium: Right atrial size was normal in size. Pericardium: There is no evidence of pericardial effusion. Mitral Valve: The mitral valve is abnormal. There is mild thickening of the mitral valve leaflet(s). Mild to moderate mitral valve regurgitation. Tricuspid Valve: The tricuspid valve is grossly normal. Tricuspid valve regurgitation is mild to moderate. Aortic Valve: The aortic valve is tricuspid. Aortic valve regurgitation is not visualized. Aortic valve sclerosis/calcification is present, without any evidence of aortic stenosis. Pulmonic Valve: The pulmonic valve was grossly normal. Pulmonic valve regurgitation is trivial. Aorta: Aortic dilatation noted. There is mild dilatation of the ascending aorta, measuring 40 mm. Venous: The inferior vena cava is dilated in size with less  than 50% respiratory variability, suggesting right atrial pressure of 15 mmHg. IAS/Shunts: No atrial level shunt detected by color flow Doppler.  LEFT VENTRICLE PLAX 2D LVIDd:         5.80 cm         Diastology LVIDs:         3.90 cm         LV e' medial:    8.81 cm/s LV PW:         1.20 cm         LV E/e' medial:  9.1 LV IVS:        1.20 cm         LV e' lateral:   13.60 cm/s LVOT diam:     2.60 cm         LV E/e' lateral: 5.9 LV SV:         131 LV SV Index:   52 LVOT Area:     5.31 cm        3D Volume EF                                LV 3D EF:    Left                                             ventricul LV Volumes (MOD)                            ar LV vol d, MOD    194.0 ml                   ejection A2C:                                        fraction LV vol d, MOD    154.0 ml                   by 3D A4C:                                        volume is LV vol s, MOD    63.2 ml                    62 %. A2C: LV vol s, MOD    69.3 ml A4C:  3D Volume EF: LV SV MOD A2C:   130.8 ml      3D EF:        62 % LV SV MOD A4C:   154.0 ml      LV EDV:       248 ml LV SV MOD BP:    111.6 ml      LV ESV:       94 ml                                LV SV:        153 ml RIGHT VENTRICLE             IVC RV S prime:     13.70 cm/s  IVC diam: 2.40 cm TAPSE (M-mode): 2.0 cm LEFT ATRIUM              Index        RIGHT ATRIUM           Index LA diam:        5.40 cm  2.14 cm/m   RA Area:     18.20 cm LA Vol (A2C):   64.3 ml  25.44 ml/m  RA Volume:   53.70 ml  21.25 ml/m LA Vol (A4C):   111.0 ml 43.92 ml/m LA Biplane Vol: 88.1 ml  34.86 ml/m  AORTIC VALVE             PULMONIC VALVE LVOT Vmax:   122.00 cm/s PR End Diast Vel: 1.94 msec LVOT Vmean:  79.000 cm/s LVOT VTI:    0.246 m  AORTA Ao Root diam: 3.70 cm Ao Asc diam:  4.05 cm MITRAL VALVE               TRICUSPID VALVE MV Area (PHT): 3.12 cm    TR Peak grad:   43.3 mmHg MV Decel Time: 243 msec    TR Vmax:        329.00 cm/s MV E velocity: 80.20 cm/s MV  A velocity: 96.10 cm/s  SHUNTS MV E/A ratio:  0.83        Systemic VTI:  0.25 m                            Systemic Diam: 2.60 cm Lyman Bishop MD Electronically signed by Lyman Bishop MD Signature Date/Time: 12/19/2021/1:48:52 PM    Final    VAS [place] LOWER EXTREMITY VENOUS (DVT)  Result Date: 12/15/2021  Lower Venous DVT Study Patient Name:  SEGUNDO MAKELA  Date of Exam:   12/14/2021 Medical Rec #: [id number]        Accession #:    [id number] Date of Birth: [birth year]        Patient Gender: M Patient Age:   5 years Exam Location:  [facility] Procedure:      VAS [place] LOWER EXTREMITY VENOUS (DVT) Referring Phys: [place] --------------------------------------------------------------------------------  Indications: Follow up exam - previous DVT.  Risk Factors: HX DVT & PE. Anticoagulation: Eliquis prior to admission. Limitations: Body habitus and poor ultrasound/tissue interface. Comparison Study: Previous LLEV exam on 12/07/2021 positive for DVT (chronic &                   age indeterminate FV) Performing Technologist: Jody Hill RVT, RDMS  Examination Guidelines: A complete evaluation includes B-mode imaging, spectral Doppler, color Doppler,  and power Doppler as needed of all accessible portions of each vessel. Bilateral testing is considered an integral part of a complete examination. Limited examinations for reoccurring indications may be performed as noted. The reflux portion of the exam is performed with the patient in reverse Trendelenburg.  +-----+---------------+---------+-----------+----------+--------------+  RIGHT Compressibility Phasicity Spontaneity Properties Thrombus Aging  +-----+---------------+---------+-----------+----------+--------------+  CFV   Full            Yes       Yes                                    +-----+---------------+---------+-----------+----------+--------------+   +---------+---------------+---------+-----------+----------+-------------------+  LEFT       Compressibility Phasicity Spontaneity Properties Thrombus Aging       +---------+---------------+---------+-----------+----------+-------------------+  CFV       Full            Yes       Yes                                         +---------+---------------+---------+-----------+----------+-------------------+  SFJ       Full                                                                  +---------+---------------+---------+-----------+----------+-------------------+  FV Prox   Partial         Yes       Yes                    Chronic              +---------+---------------+---------+-----------+----------+-------------------+  FV Mid    Partial         Yes       Yes                    Chronic              +---------+---------------+---------+-----------+----------+-------------------+  FV Distal None            No        No                     Chronic              +---------+---------------+---------+-----------+----------+-------------------+  PFV       Full                                                                  +---------+---------------+---------+-----------+----------+-------------------+  POP       Partial         Yes       Yes                    Chronic              +---------+---------------+---------+-----------+----------+-------------------+  PTV  Not well visualized  +---------+---------------+---------+-----------+----------+-------------------+  PERO                                                       Not well visualized  +---------+---------------+---------+-----------+----------+-------------------+     Summary: RIGHT: - No evidence of common femoral vein obstruction.  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 - No cystic structure found in the popliteal fossa. - Ultrasound characteristics of enlarged lymph nodes noted in the groin.  *See table(s) above  for measurements and observations. Electronically signed by Monica Martinez MD on 12/15/2021 at 10:26:18 AM.    Final    VAS [place] LOWER EXTREMITY VENOUS (DVT) (7a-7p)  Result Date: 12/08/2021  Lower Venous DVT Study Patient Name:  ORLO BRICKLE  Date of Exam:   12/07/2021 Medical Rec #: [id number]        Accession #:    [id number] Date of Birth: February 03, 1951        Patient Gender: M Patient Age:   75 years Exam Location:  [facility] Procedure:      VAS [place] LOWER EXTREMITY VENOUS (DVT) Referring Phys: Thelma Comp NANAVATI --------------------------------------------------------------------------------  Indications: Erythema, Edema, and wounds.  Limitations: Body habitus, poor ultrasound/tissue interface, open wound and position. Comparison Study: 09/17/2021 left lower extremity venous duplex- age                   indeterminate DVT left mid and distal femoral vein. Performing Technologist: Maudry Mayhew MHA, RDMS, RVT, RDCS  Examination Guidelines: A complete evaluation includes B-mode imaging, spectral Doppler, color Doppler, and power Doppler as needed of all accessible portions of each vessel. Bilateral testing is considered an integral part of a complete examination. Limited examinations for reoccurring indications may be performed as noted. The reflux portion of the exam is performed with the patient in reverse Trendelenburg.  Right Technical Findings: Not visualized segments include CFV.  +---------+---------------+---------+-----------+----------+-----------------+  LEFT      Compressibility Phasicity Spontaneity Properties Thrombus Aging     +---------+---------------+---------+-----------+----------+-----------------+  CFV       Full            Yes       Yes                                       +---------+---------------+---------+-----------+----------+-----------------+  SFJ       Full                                                                 +---------+---------------+---------+-----------+----------+-----------------+  FV Prox   Partial         Yes       Yes                    Age Indeterminate  +---------+---------------+---------+-----------+----------+-----------------+  FV Mid    Partial  Age Indeterminate  +---------+---------------+---------+-----------+----------+-----------------+  FV Distal None                      No                     Chronic            +---------+---------------+---------+-----------+----------+-----------------+  PFV       Full                                                                +---------+---------------+---------+-----------+----------+-----------------+  POP       Full            Yes       Yes                                       +---------+---------------+---------+-----------+----------+-----------------+  PTV       Full                      Yes                                       +---------+---------------+---------+-----------+----------+-----------------+   Left Technical Findings: Not visualized segments include peroneal veins, limited visualization left PTV.   Summary: LEFT: - Findings consistent with age indeterminate deep vein thrombosis involving the left proximal and mid femoral vein. - Findings consistent with chronic deep vein thrombosis involving the distal left femoral vein. - No cystic structure found in the popliteal fossa.  When compared to prior study 09/17/2021, there appears to have been some proximal propagation to the proximal left femoral vein.  *See table(s) above for measurements and observations. Electronically signed by Orlie Pollen on 12/08/2021 at 11:43:22 AM.    Final    VAS US RENAL ARTERY DUPLEX  Result Date: 12/19/2021 ABDOMINAL VISCERAL Patient Name:  FRANCHOT POLLITT  Date of Exam:   12/19/2021 Medical Rec #: [id number]        Accession #:    [id number] Date of Birth: 01/31/1951        Patient Gender: M Patient Age:   19 years Exam  Location:  [facility] Procedure:      VAS US RENAL ARTERY DUPLEX Referring Phys: [id number] ELISE L STEPHENSON -------------------------------------------------------------------------------- High Risk Factors: Hypertension, Diabetes, no history of smoking. Other Factors: CKD4, CHF,. Limitations: Obesity, patient discomfort and severe right side hydronephrosis. Comparison Study: No previous exams Performing Technologist: Rogelia Rohrer RVT, RDMS Supporting Technologist: Oda Cogan RDMS, RVT  Examination Guidelines: A complete evaluation includes B-mode imaging, spectral Doppler, color Doppler, and power Doppler as needed of all accessible portions of each vessel. Bilateral testing is considered an integral part of a complete examination. Limited examinations for reoccurring indications may be performed as noted.  Duplex Findings: +----------+--------+--------+------+--------+  Mesenteric PSV cm/s EDV cm/s Plaque Comments  +----------+--------+--------+------+--------+  Aorta Prox    83       13                     +----------+--------+--------+------+--------+    +-----------------+--------+--------+-------+  Left Renal Artery PSV cm/s EDV cm/s Comment  +-----------------+--------+--------+-------+  Origin              123       22             +-----------------+--------+--------+-------+  Proximal            110       16             +-----------------+--------+--------+-------+  Mid                  63       19             +-----------------+--------+--------+-------+ +------------+--------+--------+--+-----------+--------+--------+----+  Right Kidney PSV cm/s EDV cm/s RI Left Kidney PSV cm/s EDV cm/s RI    +------------+--------+--------+--+-----------+--------+--------+----+  Upper Pole                        Upper Pole  35       8        0.74  +------------+--------+--------+--+-----------+--------+--------+----+  Mid                               Mid         21       7        0.65   +------------+--------+--------+--+-----------+--------+--------+----+  Lower Pole                        Lower Pole  24       6        0.75  +------------+--------+--------+--+-----------+--------+--------+----+  Hilar                             Hilar       64       13       0.80  +------------+--------+--------+--+-----------+--------+--------+----+ +------------------+-----+------------------+-----+  Right Kidney             Left Kidney               +------------------+-----+------------------+-----+  RAR                      RAR                       +------------------+-----+------------------+-----+  RAR (manual)             RAR (manual)              +------------------+-----+------------------+-----+  Cortex                   Cortex                    +------------------+-----+------------------+-----+  Cortex thickness         Corex thickness           +------------------+-----+------------------+-----+  Kidney length (cm) 14.10 Kidney length (cm) 15.61  +------------------+-----+------------------+-----+  Summary: Renal:  Right: Right side portion of renal artery duplex is non diagnostic.        Due to body habitus and severe hydronephrosis unable to        evaluate perfusion to and within right kidney. Left:  No evidence of left renal artery stenosis. Abnormal left        Resisitve Index.  LRV flow present. Mesenteric:  SMA and celiac artery not visualized on this exam.  *See table(s) above for measurements and observations.  Diagnosing physician: Jamelle Haring  Electronically signed by Jamelle Haring on 12/19/2021 at 3:45:22 PM.    Final     ASSESSMENT & PLAN ***  No orders of the defined types were placed in this encounter.   All questions were answered. The patient knows to call the clinic with any problems, questions or concerns.  I have spent a total of {CHL ONC TIME VISIT - VVKPQ:[id number]} minutes of face-to-face and non-face-to-face time, preparing to see the patient, obtaining and/or  reviewing separately obtained history, performing a medically appropriate examination, counseling and educating the patient, ordering medications/tests/procedures, referring and communicating with other health care professionals, documenting clinical information in the electronic health record, independently interpreting results and communicating results to the patient, and care coordination.   Dede Query, PA-C Department of Hematology/Oncology [place] at [facility] Phone: [phone]

## 2021-12-23 NOTE — Care Management Important Message (Signed)
Important Message ? ?Patient Details IM Letter given to the Patient. ?Name: Thomas [name] ?MRN: [id number] ?Date of Birth: 18-Mar-1951 ? ? ?Medicare Important Message Given:  Yes ? ? ? ? ?Kerin Salen ?12/23/2021, 10:20 AM ?

## 2021-12-23 NOTE — Plan of Care (Signed)
?  Problem: Safety: ?Goal: Ability to remain free from injury will improve ?Outcome: Not Progressing ?  ?Problem: Pain Managment: ?Goal: General experience of comfort will improve ?Outcome: Not Progressing ?  ?Problem: Elimination: ?Goal: Will not experience complications related to bowel motility ?Outcome: Not Progressing ?  ?

## 2021-12-23 NOTE — Progress Notes (Signed)
PT Cancellation Note ? ?Patient Details ?Name: Thomas [name] ?MRN: [id number] ?DOB: 08-Oct-1951 ? ? ?Cancelled Treatment:    Reason Eval/Treat Not Completed: Patient declined, says he has to have lunch first. Will check back this PM. ?Tresa Endo PT ?Acute Rehabilitation Services ?Pager [phone] ?Office [phone] ? ? ?Claretha Cooper ?12/23/2021, 11:36 AM ?

## 2021-12-24 ENCOUNTER — Telehealth: Payer: Self-pay | Admitting: Physician Assistant

## 2021-12-24 ENCOUNTER — Inpatient Hospital Stay: Payer: Medicare Other | Admitting: Physician Assistant

## 2021-12-24 ENCOUNTER — Inpatient Hospital Stay: Payer: Medicare Other

## 2021-12-24 DIAGNOSIS — E875 Hyperkalemia: Secondary | ICD-10-CM | POA: Diagnosis not present

## 2021-12-24 LAB — GLUCOSE, CAPILLARY
Glucose-Capillary: 117 mg/dL — ABNORMAL HIGH (ref 70–99)
Glucose-Capillary: 155 mg/dL — ABNORMAL HIGH (ref 70–99)

## 2021-12-24 LAB — BASIC METABOLIC PANEL
Anion gap: 11 (ref 5–15)
BUN: 58 mg/dL — ABNORMAL HIGH (ref 8–23)
CO2: 24 mmol/L (ref 22–32)
Calcium: 8.5 mg/dL — ABNORMAL LOW (ref 8.9–10.3)
Chloride: 101 mmol/L (ref 98–111)
Creatinine, Ser: 3.01 mg/dL — ABNORMAL HIGH (ref 0.61–1.24)
GFR, Estimated: 22 mL/min — ABNORMAL LOW (ref >60)
Glucose, Bld: 118 mg/dL — ABNORMAL HIGH (ref 70–99)
Potassium: 4.3 mmol/L (ref 3.5–5.1)
Sodium: 136 mmol/L (ref 135–145)

## 2021-12-24 LAB — PROTIME-INR
INR: 2.2 — ABNORMAL HIGH (ref 0.8–1.2)
Prothrombin Time: 24.5 seconds — ABNORMAL HIGH (ref 11.4–15.2)

## 2021-12-24 MED ORDER — SENNOSIDES-DOCUSATE SODIUM 8.6-50 MG PO TABS: 1.0000 | ORAL_TABLET | Freq: Two times a day (BID) | ORAL | 0 refills | Status: DC

## 2021-12-24 MED ORDER — OXYCODONE HCL 5 MG PO TABS: 5.0000 mg | ORAL_TABLET | ORAL | 0 refills | Status: DC | PRN

## 2021-12-24 MED ORDER — METOPROLOL TARTRATE 50 MG PO TABS: 50.0000 mg | ORAL_TABLET | Freq: Two times a day (BID) | ORAL | 1 refills | Status: DC

## 2021-12-24 MED ORDER — SODIUM ZIRCONIUM CYCLOSILICATE 10 G PO PACK: 10.0000 g | PACK | Freq: Every day | ORAL | 3 refills | Status: DC

## 2021-12-24 MED ORDER — GABAPENTIN 100 MG PO CAPS: 100.0000 mg | ORAL_CAPSULE | Freq: Three times a day (TID) | ORAL | 3 refills | Status: DC

## 2021-12-24 NOTE — Telephone Encounter (Signed)
Called pt to r/s new hem appt. No answer and no vm available.  ?

## 2021-12-24 NOTE — Plan of Care (Signed)
?  Problem: Education: ?Goal: Knowledge of General Education information will improve ?Description: Including pain rating scale, medication(s)/side effects and non-pharmacologic comfort measures ?Outcome: Progressing ?  ?Problem: Health Behavior/Discharge Planning: ?Goal: Ability to manage health-related needs will improve ?Outcome: Progressing ?  ?Problem: Clinical Measurements: ?Goal: Respiratory complications will improve ?Outcome: Progressing ?Goal: Cardiovascular complication will be avoided ?Outcome: Progressing ?  ?Problem: Activity: ?Goal: Risk for activity intolerance will decrease ?Outcome: Progressing ?  ?Problem: Nutrition: ?Goal: Adequate nutrition will be maintained ?Outcome: Progressing ?  ?Problem: Pain Managment: ?Goal: General experience of comfort will improve ?Outcome: Progressing ?  ?Problem: Safety: ?Goal: Ability to remain free from injury will improve ?Outcome: Progressing ?  ?Problem: Skin Integrity: ?Goal: Risk for impaired skin integrity will decrease ?Outcome: Progressing ?  ?

## 2021-12-24 NOTE — Progress Notes (Signed)
Physical Therapy Treatment ?Patient Details ?Name: Thomas [name] ?MRN: [id number] ?DOB: July 28, 1951 ?Today's Date: 12/24/2021 ? ? ?History of Present Illness Patient is a 71 year old male who presented to the hosptial with back pain. patient had recent d/c on 2/27 from hospical with LLE cellulitis and worsening LLE DVT. patient was found to have hyperkalemia, AKI, chronic hydronephrosis. PMH: chronic pain, back surgeries, DVT, PE, DM, CKD, R BKA, HR, gout ? ?  ?PT Comments  ? ? The patient ambulated x 40' and 60' with Rw and  close min guard/min assistance. ? Noted the right distal limb is  bottoming in bottom of prothesis and recommended that patient see prosthetist at soon as possible due to potential injury to distal residual limb with pressure on end of limb each step. Patient reports he goes to clinic at [facility] when able. Patient does not have any  socket socks available to take up some space. ? Patient is risk for falls but  most likely at his baseline, having his  left boot  should assist with some balance. ? Patient is to Dc home, traveling by [facility] per RN which is recommended as patient states he goes up his steps with a cane and a crutch.  ?Recommendations for follow up therapy are one component of a multi-disciplinary discharge planning process, led by the attending physician.  Recommendations may be updated based on patient status, additional functional criteria and insurance authorization. ? ?Follow Up Recommendations ? Home health PT ?  ?  ?Assistance Recommended at Discharge Set up Supervision/Assistance  ?Patient can return home with the following A little help with walking and/or transfers;A little help with bathing/dressing/bathroom;Assist for transportation;Help with stairs or ramp for entrance ?  ?Equipment Recommendations ? None recommended by PT  ?  ?Recommendations for Other Services   ? ? ?  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  ? ?Mobility ? Bed Mobility ?Overal bed mobility: Modified Independent ?  ?  ?  ?  ?  ?  ?General bed mobility comments: in recliner ?  ? ?Transfers ?Overall transfer level: Needs assistance ?Equipment used: Rolling walker (2 wheels) ?Transfers: Sit to/from Stand ?Sit to Stand: Supervision ?  ?  ?  ?  ? Lateral/Scoot Transfers: Min assist ?General transfer comment: extra effort to power up from recliner, performed x 2 ?  ? ?Ambulation/Gait ?Ambulation/Gait assistance: Min guard, Min assist ?Gait Distance (Feet): 40 Feet (then 60') ?Assistive device: Rolling walker (2 wheels) ?Gait Pattern/deviations: Step-to pattern, Decreased step length - left, Decreased step length - right ?  ?  ?  ?General Gait Details: patient  ambulates slowly, cues for safety when tends to speed up. ? ? ?Stairs ?  ?  ?  ?  ?  ? ? ?Wheelchair Mobility ?  ? ?Modified Rankin (Stroke Patients Only) ?  ? ? ?  ?Balance Overall balance assessment: Needs assistance ?Sitting-balance support: Feet supported, No upper extremity supported ?Sitting balance-[name]: Good ?  ?  ?Standing balance support: During functional activity, Single extremity supported, Reliant on assistive device for balance ?Standing balance-[name]: Poor ?Standing balance comment: stood  to wash up, holds to Rw with 1 hand. unsteady, does not have right boot so off kilter. ?  ?  ?  ?  ?  ?  ?  ?  ?  ?  ?  ?  ? ?  ?  Cognition Arousal/Alertness: Awake/alert ?  ?  ?  ?  ?  ?  ?  ?  ?  ?  ?  ?  ?  ?  ?  ?  ?  ?  ?General Comments: appears to have poor insight to deficits. patient alert and oriented to self, hospital and generally to situation.patient wanting to walk longer distance, explained that he has met a goal distance for home and therapist recommends to save his energy. ?  ?  ? ?  ?Exercises   ? ?  ?General Comments   ?  ?   ? ?Pertinent Vitals/Pain Pain Assessment ?Pain Assessment: No/denies pain ?Faces Pain Scale: Hurts little more ?Pain Location: Back from neck down  ? ? ?Home Living Family/patient expects to be discharged to:: Private residence ?Living Arrangements: Alone ?Available Help at Discharge: Available PRN/intermittently ?Type of Home: House ?Home Access: Stairs to enter ?Entrance Stairs-Rails: None ?Entrance Stairs-Number of Steps: 1 ?  ?Home Layout: One level ?Home Equipment: Conservation officer, nature (2 wheels);Cane - single point;Crutches ?   ?  ?Prior Function    ?  ?  ?   ? ?PT Goals (current goals can now be found in the care plan section) Acute Rehab PT Goals ?Patient Stated Goal: go home, walk around house ?PT Goal Formulation: With patient ?Time For Goal Achievement: 01/07/22 ?Potential to Achieve Goals: Fair ? ?  ?Frequency ? ? ? Min 3X/week ? ? ? ?  ?PT Plan Current plan remains appropriate  ? ? ?Co-evaluation   ?Reason for Co-Treatment: For patient/therapist safety ?PT goals addressed during session: Mobility/safety with mobility ?OT goals addressed during session: ADL's and self-care ?  ? ?  ?AM-PAC PT "6 Clicks" Mobility   ?Outcome Measure ? Help needed turning from your back to your side while in a flat bed without using bedrails?: None ?Help needed moving from lying on your back to sitting on the side of a flat bed without using bedrails?: None ?Help needed moving to and from a bed to a chair (including a wheelchair)?: A Little ?Help needed standing up from a chair using your arms (e.g., wheelchair or bedside chair)?: A Little ?Help needed to walk in hospital room?: A Little ?Help needed climbing 3-5 steps with a railing? : A Lot ?6 Click Score: 19 ? ?  ?End of Session Equipment Utilized During Treatment: Gait belt ?Activity Tolerance: Patient tolerated treatment well ?Patient left: in chair;with call bell/phone within reach ?Nurse Communication: Mobility status ?PT Visit Diagnosis: Muscle weakness (generalized)  (M62.81);Difficulty in walking, not elsewhere classified (R26.2) ?  ? ? ?Time: 5284-1324 ?PT Time Calculation (min) (ACUTE ONLY): 27 min ? ?Charges:  $Gait Training: 23-37 mins          ?          ? ?Tresa Endo PT ?Acute Rehabilitation Services ?Pager [phone] ?Office [phone] ? ? ? ?Thomas [name] ?12/24/2021, 11:19 AM ? ?

## 2021-12-24 NOTE — Evaluation (Signed)
Occupational Therapy Evaluation Patient Details Name: Thomas [name] MRN: [id number] DOB: [birth year] Today's Date: 12/24/2021   History of Present Illness Patient is a 71 year old male who presented to the hosptial with back pain. patient had recent d/c on 2/27 from hospical with LLE cellulitis and worsening LLE DVT. patient was dount o have hyperkalemia, AKI, chronic hydronephrosis. PMH: chronic pain, back surgeries, DVT, PE, DM, CKD, R BKA, HR, gout   Clinical Impression   Patient is a 71 year old male who was noted to have had 5 hospital admissions in the last 6 months. Patient is familiar to this therapist for past few admissions. Patient was noted to need min A +2 for balance with standing with patient noted to have L shoe at home leaving patient off balance with RLE prosthesis in place. Patient noted to have continued poor safety awareness, decreased functional activity tolerance and decreased endurance impacting participation in ADLs. Patient would benefit from more caregiver support to be successful in next level of care. Patient would continue to benefit from skilled OT services at this time while admitted and after d/c to address noted deficits in order to improve overall safety and independence in ADLs.     Recommendations for follow up therapy are one component of a multi-disciplinary discharge planning process, led by the attending physician.  Recommendations may be updated based on patient status, additional functional criteria and insurance authorization.   Follow Up Recommendations  Skilled nursing-short term rehab (<3 hours/day)    Assistance Recommended at Discharge Frequent or constant Supervision/Assistance  Patient can return home with the following A lot of help with walking and/or transfers;A lot of help with bathing/dressing/bathroom;Assistance with cooking/housework;Direct supervision/assist for financial management;Assist for transportation;Help with stairs or ramp for  entrance;Direct supervision/assist for medications management    Functional Status Assessment  Patient has had a recent decline in their functional status and demonstrates the ability to make significant improvements in function in a reasonable and predictable amount of time.  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 unsteady, does not have right boot so off kilter.                           ADL either performed or assessed with clinical judgement   ADL Overall ADL's : Needs assistance/impaired Eating/Feeding: Set up;Sitting   Grooming: Wash/dry face;Wash/dry hands;Sitting;Set up Grooming Details (indicate cue type and reason): in recliner Upper Body Bathing: Set up;Sitting Upper Body Bathing Details (indicate cue type and reason): In recliner chair Lower Body Bathing: Sit to/from stand;Minimal assistance;+2 for physical assistance;+2 for safety/equipment Lower Body Bathing  Details (indicate cue type and reason): to complete in standing. patient is able to complete in sitting. Upper Body Dressing : Set up;Sitting Upper Body Dressing Details (indicate cue type and reason): in recliner   Lower Body Dressing  Details (indicate cue type and reason): patient was able to don prostesis sitting on EOB with no physical assistance. patient needed min A +2 for safety in standing to complete readhing posteriorly to simulate pants Toilet Transfer: Minimal assistance;+2 for safety/equipment;+2 for physical assistance Toilet Transfer Details (indicate cue type and reason): with RW with unsteadiness noted with transfer to recliner with +2 needed for safety. patient noted to plop into chair when transitioning from stand to sit. Toileting- Clothing Manipulation and Hygiene: Sitting/lateral lean;Min guard       Functional mobility during ADLs: +2 for physical assistance;+2 for safety/equipment       Vision Baseline Vision/History: 1 Wears glasses Patient Visual Report: No change from baseline       Perception     Praxis      Pertinent Vitals/Pain Pain Assessment Pain Assessment: Faces Faces Pain Scale: Hurts little more Pain Location: Back from neck down Pain Descriptors / Indicators: Discomfort Pain Intervention(s): Limited activity within patient's tolerance, Monitored during session, Repositioned     Hand Dominance Right   Extremity/Trunk Assessment Upper Extremity Assessment Upper Extremity Assessment: Defer to OT evaluation   Lower Extremity Assessment Lower Extremity Assessment: Defer to PT evaluation   Cervical / Trunk Assessment Cervical / Trunk Assessment: Normal   Communication Communication Communication: No difficulties   Cognition Arousal/Alertness: Awake/alert Behavior During Therapy: WFL for tasks assessed/performed                                   General Comments: appears to have poor insight to deficits. patient alert and oriented to self, hospital and generally to situation.     General Comments       Exercises     Shoulder Instructions      Home Living Family/patient expects to be discharged to:: Private residence Living Arrangements:  Alone Available Help at Discharge: Available PRN/intermittently Type of Home: House Home Access: Stairs to enter [employer] of Steps: 1 Entrance Stairs-Rails: None Home Layout: One level     Bathroom Shower/Tub: Teacher, early years/pre: Standard Bathroom Accessibility: Yes   Home Equipment: Conservation officer, nature (2 wheels);Cane - single point;Crutches          Prior Functioning/Environment Prior Level of Function : Needs assist               ADLs Comments: patient reported being independent in ADLs. patient reported preferring sink bathing to showers. patient reported using RW at home for functional mobility. patient reported sleeping in recliner chair at home        OT Problem List: Decreased activity tolerance;Impaired balance (sitting and/or standing);Decreased safety awareness;Decreased knowledge of precautions;Decreased knowledge of use of DME or AE      OT Treatment/Interventions: Self-care/ADL training;Therapeutic exercise;Neuromuscular education;Energy conservation;DME and/or AE instruction;Therapeutic activities;Balance training;Patient/family education    OT Goals(Current goals can be found in the care plan section) Acute Rehab OT Goals Patient Stated Goal: to get back home OT Goal Formulation: With patient Time For Goal Achievement: 01/07/22 Potential to Achieve Goals: Good  OT Frequency: Min 2X/week    Co-evaluation PT/OT/SLP Co-Evaluation/Treatment: Yes Reason for Co-Treatment: For patient/therapist safety PT goals addressed during session: Mobility/safety with mobility OT  goals addressed during session: ADL's and self-care      AM-PAC OT "6 Clicks" Daily Activity     Outcome Measure Help from another person eating meals?: None Help from another person taking care of personal grooming?: None Help from another person toileting, which includes using toliet, bedpan, or urinal?: A Little Help from another person bathing (including  washing, rinsing, drying)?: A Little Help from another person to put on and taking off regular upper body clothing?: None Help from another person to put on and taking off regular lower body clothing?: A Little 6 Click Score: 21   End of Session Equipment Utilized During Treatment: Gait belt;Rolling walker (2 wheels) Nurse Communication: Mobility status;Patient requests pain meds  Activity Tolerance: Patient tolerated treatment well Patient left: in chair;with call bell/phone within reach;with chair alarm set  OT Visit Diagnosis: Unsteadiness on feet (R26.81)                Time: 1194-1740 OT Time Calculation (min): 23 min Charges:  OT General Charges $OT Visit: 1 Visit OT Evaluation $OT Eval Moderate Complexity: 1 Mod  Jackelyn Poling OTR/L, MS Acute Rehabilitation Department Office# [phone] Pager# [phone]   Marcellina Millin 12/24/2021, 10:44 AM

## 2021-12-24 NOTE — Discharge Summary (Signed)
Physician Discharge Summary   Patient: Thomas [name] MRN: [id number] DOB: [birth year]  Admit date:     12/18/2021  Discharge date: 12/24/21  Discharge Physician: Nita Sells   PCP: Antonietta Jewel, MD   Recommendations at discharge:   Needs renal panel in about 1 week, needs INR and CBC check in about 4 days Needs outpatient goals of care in addition to palliative to follow in addition to home health-tells me he refuses dialysis and has a [place] nephrologist who is seen him in the past-patient has been instructed to call the nephrologist and set up an appointment Patient should continue wound care as per instructions below Prognosis is guarded-patient has been hospitalized several times recently  Discharge Diagnoses: Principal Problem:   Hyperkalemia Active Problems:   Chronic combined systolic and diastolic congestive heart failure (HCC)   Acute renal failure superimposed on chronic kidney disease ([place])  Resolved Problems:   * No resolved hospital problems. 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 Gloriann Loan GI bleed coffee-ground emesis 11/2019 Dr. 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  DM TY 2 complicated by multiple admissions 12/29 through 1/3 -12/19 through 12/26-severe hypoglycemia causing encephalopathy and hospitalization   hospitalization 2/18-2/26 lower extremity cellulitis, Eliquis failure with acute on chronic DVT and transition to Coumadin-hospitalization complicated by SVT and progression of kidney disease  Readmit 12/19/2021 malaise worsening tremor-severe hyperkalemia 7.2 peaked T waves admitted to ICU Nephrology  consulted-started Lokelma, Lasix  Assessment and Plan:  AKI superimposed on CKD 5 Hyperkalemia 7.2 multifactorial--ckd prog + prior severe right hydro ureteronephrosis Foley out 3/4--Q shift B scan showed that he had no retention Continue finasteride 5 mg Flomax 0.5, Lokelma 10 mg daily Can d/c home with Nephro follow up in am Combined systolic diastolic heart failure EF 35% 09/2021 Clonidine 0.1 -->bid and d/c'd this admission secondary to severe rebound hypertension X1 medication doses missed  See MAR for further changes Doubt strict BP med compliance- unclear if prior CATH Hold Lasix at this time blood pressure goal 130-140 to prevent "normotensive " aki DVT./PE/does he have a IVC filter? 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 Lovie Macadamia secondary prevention blood pressure control etc. etc. Wounds reviewed on 3/5 and looks actually pretty good-change dressings as per orders from wound nurse-Will need outpatient follow-up Chronic discitis  Thoracolumbar spine Holding MS Contin XR secondary to renal clearance issues-increased oxy to 5-10, continue gabapentin lower dose 100 3 times daily Resumed Zanaflex 4 every 8 as needed Needs outpatient pain management Pressure ulcer present on admission Wound care given as per nursing/wound nurse  Patient is very high risk for readmission-he refuses to go to skilled facility, he declines hemodialysis secondary to having several of his friends not do well on it and he insists on a regular diet-with all these things in mind I have asked that palliative care follow him in addition to home health in the outpatient setting-hopefully he will not become sick and readmit  Consultants: Nephrology Procedures performed: Multiple Disposition: Home health Diet recommendation:  Discharge Diet Orders (From admission, onward)     Start     Ordered   12/24/21 0000  Diet - low sodium heart healthy        12/24/21 0847           Regular diet DISCHARGE MEDICATION: Allergies as of 12/24/2021       Reactions   Other Other (See Comments)   Blood pressure issues Per [facility] hospital: Pt states he can only take these pain meds or else he gets very sick: Oxycontin,morphine,demerol, and dilaudid are the only pain meds pt states he can take.    Oxymorphone Other (See Comments)   Causes kidney problems   Aspirin Nausea And Vomiting   Per [facility]   Beta Vulgaris Nausea And Vomiting   Beets   Buspirone Nausea And Vomiting, Other (See Comments)   Affected his head   Cabbage Nausea And Vomiting   Codeine Other (See Comments)   Unknown reaction   Dolobid [diflunisal] Other (See Comments)   Unknown reaction   Fish Allergy Nausea And Vomiting   Fish-derived Products Nausea And Vomiting   Methadone Other (See Comments)   Made him loopy   Pentazocine Other (See Comments)   Unknown reaction   Propoxyphene Nausea And Vomiting, Other (See Comments)   Messed with his head   Shellfish Allergy Nausea And Vomiting   Sulfa Antibiotics Hives   Sulfasalazine Other (See Comments)   Unknown reaction   Vistaril [hydroxyzine] Nausea And Vomiting, Other (See Comments)   Didn't help   Amoxicillin Rash   Per [facility]        Medication List     STOP taking these medications    allopurinol 100 MG tablet Commonly known as: ZYLOPRIM   cloNIDine 0.1 MG tablet Commonly known as: CATAPRES   colchicine 0.6 MG tablet   furosemide 40 MG tablet Commonly known as: LASIX   hydrALAZINE 100 MG tablet Commonly known as: APRESOLINE   metFORMIN 500 MG tablet Commonly known as: GLUCOPHAGE   metroNIDAZOLE 500 MG tablet Commonly known as: FLAGYL    OxyCONTIN 30 MG 12 hr tablet Generic drug: oxyCODONE Replaced by: oxyCODONE 5 MG immediate release tablet   polyethylene glycol 17 g packet Commonly known as: MiraLax       TAKE these medications    amLODipine 5 MG tablet Commonly known as: NORVASC Take 5 mg by mouth every morning.   atorvastatin 80 MG tablet Commonly known as: LIPITOR Take 1 tablet (80 mg total) by mouth daily.   bisacodyl 10 MG suppository Commonly known as: DULCOLAX Place 10 mg rectally daily.   ferrous sulfate 325 (65 FE) MG tablet Take 1 tablet (325 mg total) by mouth daily with breakfast.   finasteride 5 MG tablet Commonly known as: PROSCAR Take 1 tablet (5 mg total) by mouth daily. What changed: when to take this   gabapentin 100 MG capsule Commonly known as: NEURONTIN Take 1 capsule (100 mg total) by mouth 3 (three) times daily. What changed:  medication strength how much to take when to take this   glipiZIDE 10 MG 24 hr tablet Commonly known as: GLUCOTROL XL Take 10 mg by mouth daily as needed (CBG >120).   lactulose 10 GM/15ML solution Commonly known as: [place] Take by mouth 2 (two) times daily as needed (constipation).   levothyroxine 50 MCG tablet Commonly known as: SYNTHROID Take 50 mcg by mouth daily before breakfast.  metoprolol tartrate 50 MG tablet Commonly known as: LOPRESSOR Take 1 tablet (50 mg total) by mouth 2 (two) times daily. What changed:  when to take this reasons to take this Another medication with the same name was removed. Continue taking this medication, and follow the directions you see here.   naloxegol oxalate 25 MG Tabs tablet Commonly known as: MOVANTIK Take 25 mg by mouth every morning.   oxyCODONE 5 MG immediate release tablet Commonly known as: Oxy IR/ROXICODONE Take 1-2 tablets (5-10 mg total) by mouth every 4 (four) hours as needed for severe pain. Replaces: OxyCONTIN 30 MG 12 hr tablet   pantoprazole 40 MG tablet Commonly known as:  PROTONIX Take 1 tablet by mouth 2 times daily.   senna-docusate 8.6-50 MG tablet Commonly known as: Senokot-S Take 1 tablet by mouth 2 (two) times daily for 7 days.   sodium zirconium cyclosilicate 10 g Pack packet Commonly known as: LOKELMA Take 10 g by mouth daily.   tamsulosin 0.4 MG Caps capsule Commonly known as: FLOMAX Take 1 capsule (0.4 mg total) by mouth daily.   tiZANidine 4 MG tablet Commonly known as: ZANAFLEX Take 4 mg by mouth every 8 (eight) hours as needed for muscle spasms.   warfarin 2.5 MG tablet Commonly known as: COUMADIN Take 3 tablets (7.5 mg total) by mouth one time only at 4 PM. INR check in 3 days and adjust coumadin dose for INR of 2-3 What changed: Another medication with the same name was removed. Continue taking this medication, and follow the directions you see here.               Discharge Care Instructions  (From admission, onward)           Start     Ordered   12/24/21 0000  Discharge wound care:       Comments: As above   12/24/21 0847            Discharge Exam: Filed Weights   12/22/21 0500 12/23/21 0451 12/24/21 0337  Weight: 115.1 kg 117.8 kg 116.8 kg   Awake coherent no distress EOMI NCAT no focal deficit ROM intact Left lower extremity wrapped but not visualized today Right AKA stable Postoperative changes to abdomen Chest clear no rales no rhonchi-patient not on oxygen Neurologically intact  Condition at discharge: worsening  The results of significant diagnostics from this hospitalization (including imaging, microbiology, ancillary and laboratory) are listed below for reference.   Imaging Studies: DG Tibia/Fibula Left  Result Date: 12/07/2021 CLINICAL DATA:  Pain and swelling EXAM: LEFT TIBIA AND FIBULA - 2 VIEW COMPARISON:  None. FINDINGS: No recent fracture or dislocation is seen. There are no focal lytic lesions. There are no demonstrable pockets of air in the soft tissues. There is soft tissue  swelling in the subcutaneous plane in the lower leg and around the left ankle. Deformity in the patella may be residual from previous injury. Degenerative changes are noted with bony spurs in the left knee, more severe in the patellofemoral compartment. IMPRESSION: No fracture or dislocation is seen. There are no opaque foreign bodies or abnormal pockets of air in the soft tissues. If there is continued clinical suspicion for osteomyelitis, follow-up three-phase bone scan or MRI may be considered. Electronically Signed   By: Elmer Picker M.D.   On: 12/07/2021 15:35   CT HEAD WO CONTRAST (5MM)  Result Date: 12/19/2021 CLINICAL DATA:  71 year old male with altered mental status. EXAM: CT HEAD WITHOUT CONTRAST TECHNIQUE:  Contiguous axial images were obtained from the base of the skull through the vertex without intravenous contrast. RADIATION DOSE REDUCTION: This exam was performed according to the departmental dose-optimization program which includes automated exposure control, adjustment of the mA and/or kV according to patient size and/or use of iterative reconstruction technique. COMPARISON:  Head CT 10/07/2021 and earlier. FINDINGS: Brain: Stable cerebral volume. No midline shift, ventriculomegaly, mass effect, evidence of mass lesion, intracranial hemorrhage or evidence of cortically based acute infarction. 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 Elsewhere gray-white matter differentiation appears normal for age. No cortical encephalomalacia identified. Vascular: Calcified atherosclerosis at the skull base. No suspicious intracranial vascular hyperdensity. Skull: No acute osseous abnormality identified. Sinuses/Orbits: Visualized paranasal sinuses and mastoids are stable and well aerated. Other: Visualized scalp soft tissues are within normal limits. Slight leftward gaze, otherwise negative visible orbits. IMPRESSION: No acute intracranial abnormality.  Stable mild for age chronic small vessel disease. Electronically Signed   By: Genevie Ann M.D.   On: 12/19/2021 05:26   US RENAL  Result Date: 12/19/2021 CLINICAL DATA:  Acute kidney injury EXAM: RENAL / URINARY TRACT ULTRASOUND COMPLETE COMPARISON:  10/08/2021 FINDINGS: Right Kidney: Renal measurements: 12.4 x 5.5 x 5.2 cm = volume: 188 mL. Severe right hydronephrosis with overlying cortical thinning. No change since prior study. Left Kidney: Renal measurements: 16.2 x 5.6 x 5.1 cm = volume: 2.4 mL. No hydronephrosis. Normal echotexture. Small cysts measuring up to 1.5 cm. Bladder: Decompressed, not visualized. Other: None. IMPRESSION: Severe chronic right hydronephrosis with overlying cortical thinning. Findings stable since prior study. Electronically Signed   By: Rolm Baptise M.D.   On: 12/19/2021 01:23   VAS [place] ABI WITH/WO TBI  Result Date: 12/09/2021  LOWER EXTREMITY DOPPLER STUDY Patient Name:  Thomas [name]  Date of Exam:   12/09/2021 Medical Rec #: [id number]        Accession #:    [id number] Date of Birth: 05-16-1951        Patient Gender: M Patient Age:   43 years Exam Location:  [facility] Procedure:      VAS [place] ABI WITH/WO TBI Referring Phys: Soundra Pilon AMIN --------------------------------------------------------------------------------  Indications: Ulceration, and peripheral artery disease. High Risk Factors: Hypertension, Diabetes. Other Factors: 12/07/2021 - LEFT:                - Findings consistent with age indeterminate deep vein                thrombosis                involving the left proximal and mid femoral vein.                - Findings consistent with chronic deep vein thrombosis                involving the                distal left femoral vein.                - No cystic structure found in the popliteal fossa.  Limitations: Today's exam was limited due to an open wound and bandages. Comparison Study: 11/30/2019 - Right:                   RT BKA.                    Left:  Resting left ankle-brachial index indicates                   noncompressible left                   lower extremity arteries. Performing Technologist: Carlos Levering RVT  Examination Guidelines: A complete evaluation includes at minimum, Doppler waveform signals and systolic blood pressure reading at the level of bilateral brachial, anterior tibial, and posterior tibial arteries, when vessel segments are accessible. Bilateral testing is considered an integral part of a complete examination. Photoelectric Plethysmograph (PPG) waveforms and toe systolic pressure readings are included as required and additional duplex testing as needed. Limited examinations for reoccurring indications may be performed as noted.  ABI Findings: +---------+------------------+-----+---------+--------+  Right     Rt Pressure (mmHg) Index Waveform  Comment   +---------+------------------+-----+---------+--------+  Brachial  181                      triphasic           +---------+------------------+-----+---------+--------+  PTA                                          BKA       +---------+------------------+-----+---------+--------+  DP                                           BKA       +---------+------------------+-----+---------+--------+  Great Toe                                    BKA       +---------+------------------+-----+---------+--------+ +--------+------------------+-----+----------+-------+  Left     Lt Pressure (mmHg) Index Waveform   Comment  +--------+------------------+-----+----------+-------+  Brachial 171                      triphasic           +--------+------------------+-----+----------+-------+  PTA      222                1.23  triphasic           +--------+------------------+-----+----------+-------+  DP       197                1.09  monophasic          +--------+------------------+-----+----------+-------+ +-------+-----------+-----------+------------+------------+  ABI/TBI Today's ABI Today's TBI Previous  ABI Previous TBI  +-------+-----------+-----------+------------+------------+  Right   BKA                     BKA                        +-------+-----------+-----------+------------+------------+  Left    1.23                    [place]                         +-------+-----------+-----------+------------+------------+  Summary: Left: Resting left ankle-brachial index is within normal range. No evidence of significant left lower extremity arterial disease. Unable to obtain TBI due to great  toe size.  *See table(s) above for measurements and observations.  Electronically signed by Monica Martinez MD on 12/09/2021 at 2:33:42 PM.    Final    ECHOCARDIOGRAM COMPLETE  Result Date: 12/19/2021    ECHOCARDIOGRAM REPORT   Patient Name:   Thomas [name] Date of Exam: 12/19/2021 Medical Rec #:  [id number]       Height:       77.0 in Accession #:    [id number]      Weight:       266.5 lb Date of Birth:  06/19/1951       BSA:          2.527 m Patient Age:    35 years        BP:           120/53 mmHg Patient Gender: M               HR:           66 bpm. Exam Location:  Inpatient Procedure: 2D Echo, 3D Echo, Cardiac Doppler and Color Doppler Indications:    R94.31 Abnormal EKG  History:        Patient has prior history of Echocardiogram examinations, most                 recent 09/24/2021. CHF, Abnormal ECG, Arrythmias:SVT; Risk                 Factors:Hypertension and Diabetes. Pulmonary embolus.  Sonographer:    Roseanna Rainbow RDCS Referring Phys: [id number] ELISE L STEPHENSON  Sonographer Comments: Technically difficult study due to poor echo windows and patient is morbidly obese. Image acquisition challenging due to patient body habitus. IMPRESSIONS  1. Left ventricular ejection fraction, by estimation, is 60 to 65%. Left ventricular ejection fraction by 3D volume is 62 %. The left ventricle has normal function. The left ventricle has no regional wall motion abnormalities. There is mild left ventricular hypertrophy. Left ventricular  diastolic parameters are consistent with Grade I diastolic dysfunction (impaired relaxation).  2. Right ventricular systolic function is normal. The right ventricular size is normal. There is moderately elevated pulmonary artery systolic pressure. The estimated right ventricular systolic pressure is 62.8 mmHg.  3. Left atrial size was mildly dilated.  4. The mitral valve is abnormal. Mild to moderate mitral valve regurgitation.  5. Tricuspid valve regurgitation is mild to moderate.  6. The aortic valve is tricuspid. Aortic valve regurgitation is not visualized. Aortic valve sclerosis/calcification is present, without any evidence of aortic stenosis.  7. Aortic dilatation noted. There is mild dilatation of the ascending aorta, measuring 40 mm.  8. The inferior vena cava is dilated in size with <50% respiratory variability, suggesting right atrial pressure of 15 mmHg. Comparison(s): Changes from prior study are noted. 09/24/2021: LVEF 35-40%, moderate MR, dilated aorta to 42 mm, RVSP 64 mmHg. FINDINGS  Left Ventricle: Left ventricular ejection fraction, by estimation, is 60 to 65%. Left ventricular ejection fraction by 3D volume is 62 %. The left ventricle has normal function. The left ventricle has no regional wall motion abnormalities. The left ventricular internal cavity size was normal in size. There is mild left ventricular hypertrophy. Left ventricular diastolic parameters are consistent with Grade I diastolic dysfunction (impaired relaxation). Normal left ventricular filling pressure. Right Ventricle: The right ventricular size is normal. No increase in right ventricular wall thickness. Right ventricular systolic function is normal. There is moderately elevated pulmonary artery systolic pressure. The tricuspid regurgitant velocity is  3.29 m/s, and with an assumed right atrial pressure of 15 mmHg, the estimated right ventricular systolic pressure is 09.3 mmHg. Left Atrium: Left atrial size was mildly dilated.  Right Atrium: Right atrial size was normal in size. Pericardium: There is no evidence of pericardial effusion. Mitral Valve: The mitral valve is abnormal. There is mild thickening of the mitral valve leaflet(s). Mild to moderate mitral valve regurgitation. Tricuspid Valve: The tricuspid valve is grossly normal. Tricuspid valve regurgitation is mild to moderate. Aortic Valve: The aortic valve is tricuspid. Aortic valve regurgitation is not visualized. Aortic valve sclerosis/calcification is present, without any evidence of aortic stenosis. Pulmonic Valve: The pulmonic valve was grossly normal. Pulmonic valve regurgitation is trivial. Aorta: Aortic dilatation noted. There is mild dilatation of the ascending aorta, measuring 40 mm. Venous: The inferior vena cava is dilated in size with less than 50% respiratory variability, suggesting right atrial pressure of 15 mmHg. IAS/Shunts: No atrial level shunt detected by color flow Doppler.  LEFT VENTRICLE PLAX 2D LVIDd:         5.80 cm         Diastology LVIDs:         3.90 cm         LV e' medial:    8.81 cm/s LV PW:         1.20 cm         LV E/e' medial:  9.1 LV IVS:        1.20 cm         LV e' lateral:   13.60 cm/s LVOT diam:     2.60 cm         LV E/e' lateral: 5.9 LV SV:         131 LV SV Index:   52 LVOT Area:     5.31 cm        3D Volume EF                                LV 3D EF:    Left                                             ventricul LV Volumes (MOD)                            ar LV vol d, MOD    194.0 ml                   ejection A2C:                                        fraction LV vol d, MOD    154.0 ml                   by 3D A4C:                                        volume is LV vol s, MOD    63.2 ml  62 %. A2C: LV vol s, MOD    69.3 ml A4C:                           3D Volume EF: LV SV MOD A2C:   130.8 ml      3D EF:        62 % LV SV MOD A4C:   154.0 ml      LV EDV:       248 ml LV SV MOD BP:    111.6 ml      LV ESV:       94 ml                                 LV SV:        153 ml RIGHT VENTRICLE             IVC RV S prime:     13.70 cm/s  IVC diam: 2.40 cm TAPSE (M-mode): 2.0 cm LEFT ATRIUM              Index        RIGHT ATRIUM           Index LA diam:        5.40 cm  2.14 cm/m   RA Area:     18.20 cm LA Vol (A2C):   64.3 ml  25.44 ml/m  RA Volume:   53.70 ml  21.25 ml/m LA Vol (A4C):   111.0 ml 43.92 ml/m LA Biplane Vol: 88.1 ml  34.86 ml/m  AORTIC VALVE             PULMONIC VALVE LVOT Vmax:   122.00 cm/s PR End Diast Vel: 1.94 msec LVOT Vmean:  79.000 cm/s LVOT VTI:    0.246 m  AORTA Ao Root diam: 3.70 cm Ao Asc diam:  4.05 cm MITRAL VALVE               TRICUSPID VALVE MV Area (PHT): 3.12 cm    TR Peak grad:   43.3 mmHg MV Decel Time: 243 msec    TR Vmax:        329.00 cm/s MV E velocity: 80.20 cm/s MV A velocity: 96.10 cm/s  SHUNTS MV E/A ratio:  0.83        Systemic VTI:  0.25 m                            Systemic Diam: 2.60 cm Lyman Bishop MD Electronically signed by Lyman Bishop MD Signature Date/Time: 12/19/2021/1:48:52 PM    Final    VAS [place] LOWER EXTREMITY VENOUS (DVT)  Result Date: 12/15/2021  Lower Venous DVT Study Patient Name:  Thomas [name]  Date of Exam:   12/14/2021 Medical Rec #: [id number]        Accession #:    [id number] Date of Birth: [birth year]        Patient Gender: M Patient Age:   87 years Exam Location:  [facility] Procedure:      VAS [place] LOWER EXTREMITY VENOUS (DVT) Referring Phys: [place] --------------------------------------------------------------------------------  Indications: Follow up exam - previous DVT.  Risk Factors: HX DVT & PE. Anticoagulation: Eliquis prior to admission. Limitations: Body habitus and poor ultrasound/tissue interface. Comparison Study: Previous LLEV exam on 12/07/2021 positive for DVT (chronic &  age indeterminate FV) Performing Technologist: Jody Hill RVT, RDMS  Examination Guidelines: A complete evaluation includes B-mode imaging, spectral  Doppler, color Doppler, and power Doppler as needed of all accessible portions of each vessel. Bilateral testing is considered an integral part of a complete examination. Limited examinations for reoccurring indications may be performed as noted. The reflux portion of the exam is performed with the patient in reverse Trendelenburg.  +-----+---------------+---------+-----------+----------+--------------+  RIGHT Compressibility Phasicity Spontaneity Properties Thrombus Aging  +-----+---------------+---------+-----------+----------+--------------+  CFV   Full            Yes       Yes                                    +-----+---------------+---------+-----------+----------+--------------+   +---------+---------------+---------+-----------+----------+-------------------+  LEFT      Compressibility Phasicity Spontaneity Properties Thrombus Aging       +---------+---------------+---------+-----------+----------+-------------------+  CFV       Full            Yes       Yes                                         +---------+---------------+---------+-----------+----------+-------------------+  SFJ       Full                                                                  +---------+---------------+---------+-----------+----------+-------------------+  FV Prox   Partial         Yes       Yes                    Chronic              +---------+---------------+---------+-----------+----------+-------------------+  FV Mid    Partial         Yes       Yes                    Chronic              +---------+---------------+---------+-----------+----------+-------------------+  FV Distal None            No        No                     Chronic              +---------+---------------+---------+-----------+----------+-------------------+  PFV       Full                                                                  +---------+---------------+---------+-----------+----------+-------------------+  POP       Partial         Yes        Yes  Chronic              +---------+---------------+---------+-----------+----------+-------------------+  PTV                                                        Not well visualized  +---------+---------------+---------+-----------+----------+-------------------+  PERO                                                       Not well visualized  +---------+---------------+---------+-----------+----------+-------------------+     Summary: RIGHT: - No evidence of common femoral vein obstruction.  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 - No cystic structure found in the popliteal fossa. - Ultrasound characteristics of enlarged lymph nodes noted in the groin.  *See table(s) above for measurements and observations. Electronically signed by Monica Martinez MD on 12/15/2021 at 10:26:18 AM.    Final    VAS [place] LOWER EXTREMITY VENOUS (DVT) (7a-7p)  Result Date: 12/08/2021  Lower Venous DVT Study Patient Name:  Thomas [name]  Date of Exam:   12/07/2021 Medical Rec #: [id number]        Accession #:    [id number] Date of Birth: [birth year]        Patient Gender: M Patient Age:   57 years Exam Location:  [facility] Procedure:      VAS [place] LOWER EXTREMITY VENOUS (DVT) Referring Phys: Thelma Comp NANAVATI --------------------------------------------------------------------------------  Indications: Erythema, Edema, and wounds.  Limitations: Body habitus, poor ultrasound/tissue interface, open wound and position. Comparison Study: 09/17/2021 left lower extremity venous duplex- age                   indeterminate DVT left mid and distal femoral vein. Performing Technologist: Maudry Mayhew MHA, RDMS, RVT, RDCS  Examination Guidelines: A complete evaluation includes B-mode imaging, spectral Doppler, color Doppler, and power Doppler as needed of all accessible portions of each vessel. Bilateral testing is considered an  integral part of a complete examination. Limited examinations for reoccurring indications may be performed as noted. The reflux portion of the exam is performed with the patient in reverse Trendelenburg.  Right Technical Findings: Not visualized segments include CFV.  +---------+---------------+---------+-----------+----------+-----------------+  LEFT      Compressibility Phasicity Spontaneity Properties Thrombus Aging     +---------+---------------+---------+-----------+----------+-----------------+  CFV       Full            Yes       Yes                                       +---------+---------------+---------+-----------+----------+-----------------+  SFJ       Full                                                                +---------+---------------+---------+-----------+----------+-----------------+  FV Prox  Partial         Yes       Yes                    Age Indeterminate  +---------+---------------+---------+-----------+----------+-----------------+  FV Mid    Partial                                          Age Indeterminate  +---------+---------------+---------+-----------+----------+-----------------+  FV Distal None                      No                     Chronic            +---------+---------------+---------+-----------+----------+-----------------+  PFV       Full                                                                +---------+---------------+---------+-----------+----------+-----------------+  POP       Full            Yes       Yes                                       +---------+---------------+---------+-----------+----------+-----------------+  PTV       Full                      Yes                                       +---------+---------------+---------+-----------+----------+-----------------+   Left Technical Findings: Not visualized segments include peroneal veins, limited visualization left PTV.   Summary: LEFT: - Findings consistent with age indeterminate deep vein  thrombosis involving the left proximal and mid femoral vein. - Findings consistent with chronic deep vein thrombosis involving the distal left femoral vein. - No cystic structure found in the popliteal fossa.  When compared to prior study 09/17/2021, there appears to have been some proximal propagation to the proximal left femoral vein.  *See table(s) above for measurements and observations. Electronically signed by Orlie Pollen on 12/08/2021 at 11:43:22 AM.    Final    VAS US RENAL ARTERY DUPLEX  Result Date: 12/19/2021 ABDOMINAL VISCERAL Patient Name:  Thomas [name]  Date of Exam:   12/19/2021 Medical Rec #: [id number]        Accession #:    [id number] Date of Birth: [birth year]        Patient Gender: M Patient Age:   37 years Exam Location:  [facility] Procedure:      VAS US RENAL ARTERY DUPLEX Referring Phys: [id number] ELISE L STEPHENSON -------------------------------------------------------------------------------- High Risk Factors: Hypertension, Diabetes, no history of smoking. Other Factors: CKD4, CHF,. Limitations: Obesity, patient discomfort and severe right side hydronephrosis. Comparison Study: No previous exams Performing Technologist: Rogelia Rohrer RVT, RDMS Supporting Technologist: Oda Cogan RDMS, RVT  Examination Guidelines: A complete evaluation includes B-mode imaging,  spectral Doppler, color Doppler, and power Doppler as needed of all accessible portions of each vessel. Bilateral testing is considered an integral part of a complete examination. Limited examinations for reoccurring indications may be performed as noted.  Duplex Findings: +----------+--------+--------+------+--------+  Mesenteric PSV cm/s EDV cm/s Plaque Comments  +----------+--------+--------+------+--------+  Aorta Prox    83       13                     +----------+--------+--------+------+--------+    +-----------------+--------+--------+-------+  Left Renal Artery PSV cm/s EDV cm/s Comment   +-----------------+--------+--------+-------+  Origin              123       22             +-----------------+--------+--------+-------+  Proximal            110       16             +-----------------+--------+--------+-------+  Mid                  63       19             +-----------------+--------+--------+-------+ +------------+--------+--------+--+-----------+--------+--------+----+  Right Kidney PSV cm/s EDV cm/s RI Left Kidney PSV cm/s EDV cm/s RI    +------------+--------+--------+--+-----------+--------+--------+----+  Upper Pole                        Upper Pole  35       8        0.74  +------------+--------+--------+--+-----------+--------+--------+----+  Mid                               Mid         21       7        0.65  +------------+--------+--------+--+-----------+--------+--------+----+  Lower Pole                        Lower Pole  24       6        0.75  +------------+--------+--------+--+-----------+--------+--------+----+  Hilar                             Hilar       64       13       0.80  +------------+--------+--------+--+-----------+--------+--------+----+ +------------------+-----+------------------+-----+  Right Kidney             Left Kidney               +------------------+-----+------------------+-----+  RAR                      RAR                       +------------------+-----+------------------+-----+  RAR (manual)             RAR (manual)              +------------------+-----+------------------+-----+  Cortex                   Cortex                    +------------------+-----+------------------+-----+  Cortex thickness  Corex thickness           +------------------+-----+------------------+-----+  Kidney length (cm) 14.10 Kidney length (cm) 15.61  +------------------+-----+------------------+-----+  Summary: Renal:  Right: Right side portion of renal artery duplex is non diagnostic.        Due to body habitus and severe hydronephrosis unable to        evaluate  perfusion to and within right kidney. Left:  No evidence of left renal artery stenosis. Abnormal left        Resisitve Index. LRV flow present. Mesenteric:  SMA and celiac artery not visualized on this exam.  *See table(s) above for measurements and observations.  Diagnosing physician: Jamelle Haring  Electronically signed by Jamelle Haring on 12/19/2021 at 3:45:22 PM.    Final     Microbiology: Results for orders placed or performed during the hospital encounter of 12/18/21  Resp Panel by RT-PCR (Flu A&B, Covid) Nasopharyngeal Swab     Status: None   Collection Time: 12/18/21  9:45 PM   Specimen: Nasopharyngeal Swab; Nasopharyngeal(NP) swabs in vial transport medium  Result Value Ref Range Status   SARS Coronavirus 2 by RT PCR NEGATIVE NEGATIVE Final    Comment: (NOTE) SARS-CoV-2 target nucleic acids are NOT DETECTED.  The SARS-CoV-2 RNA is generally detectable in upper respiratory specimens during the acute phase of infection. The lowest concentration of SARS-CoV-2 viral copies this assay can detect is 138 copies/mL. A negative result does not preclude SARS-Cov-2 infection and should not be used as the sole basis for treatment or other patient management decisions. A negative result may occur with  improper specimen collection/handling, submission of specimen other than nasopharyngeal swab, presence of viral mutation(s) within the areas targeted by this assay, and inadequate number of viral copies(<138 copies/mL). A negative result must be combined with clinical observations, patient history, and epidemiological information. The expected result is Negative.  Fact Sheet for Patients:  EntrepreneurPulse.com.au  Fact Sheet for Healthcare Providers:  IncredibleEmployment.be  This test is no t yet approved or cleared by the [place] FDA and  has been authorized for detection and/or diagnosis of SARS-CoV-2 by FDA under an Emergency Use Authorization  (EUA). This EUA will remain  in effect (meaning this test can be used) for the duration of the COVID-19 declaration under Section 564(b)(1) of the Act, 21 U.S.C.section 360bbb-3(b)(1), unless the authorization is terminated  or revoked sooner.       Influenza A by PCR NEGATIVE NEGATIVE Final   Influenza B by PCR NEGATIVE NEGATIVE Final    Comment: (NOTE) The Xpert Xpress SARS-CoV-2/FLU/RSV plus assay is intended as an aid in the diagnosis of influenza from Nasopharyngeal swab specimens and should not be used as a sole basis for treatment. Nasal washings and aspirates are unacceptable for Xpert Xpress SARS-CoV-2/FLU/RSV testing.  Fact Sheet for Patients: EntrepreneurPulse.com.au  Fact Sheet for Healthcare Providers: IncredibleEmployment.be  This test is not yet approved or cleared by the [place] FDA and has been authorized for detection and/or diagnosis of SARS-CoV-2 by FDA under an Emergency Use Authorization (EUA). This EUA will remain in effect (meaning this test can be used) for the duration of the COVID-19 declaration under Section 564(b)(1) of the Act, 21 U.S.C. section 360bbb-3(b)(1), unless the authorization is terminated or revoked.  Performed at [facility], [place] [address] [place], [place] [address]   MRSA Next Gen by PCR, Nasal     Status: Abnormal   Collection Time: 12/19/21  3:28 AM   Specimen: Nasal  Mucosa; Nasal Swab  Result Value Ref Range Status   MRSA by PCR Next Gen DETECTED (A) NOT DETECTED Final    Comment: CRITICAL RESULT CALLED TO, READ BACK BY AND VERIFIED WITH:  STACY WEAVER RN 12/19/21 @ 9833 VS (NOTE) The GeneXpert MRSA Assay (FDA approved for NASAL specimens only), is one component of a comprehensive MRSA colonization surveillance program. It is not intended to diagnose MRSA infection nor to guide or monitor treatment for MRSA infections. Test performance is not FDA approved in  patients less than 12 years old. Performed at [facility], [place] [address] [place], [place] [address]     Labs: CBC: Recent Labs  Lab 12/18/21 2155 12/19/21 0543 12/20/21 0237 12/21/21 0248 12/22/21 0244  WBC 9.9 7.6 6.3 5.3 5.9  NEUTROABS 7.4  --   --   --  3.5  HGB 9.3* 8.4* 8.5* 9.0* 9.4*  HCT 31.0* 27.9* 27.7* 29.2* 29.1*  MCV 106.5* 105.7* 104.9* 103.2* 101.0*  PLT 186 177 181 210 397   Basic Metabolic Panel: Recent Labs  Lab 12/19/21 0151 12/19/21 0543 12/19/21 0711 12/20/21 0237 12/20/21 1055 12/20/21 1914 12/21/21 0248 12/22/21 0244 12/23/21 0857 12/24/21 0548  NA 134* 133*   132*   < > 134*  --   --  135 136 136 136  K 5.5* 6.1*   6.1*   < > 5.5*   5.5*   < > 5.0 4.6 4.3 4.5 4.3  CL 103 103   102   < > 101  --   --  98 99 99 101  CO2 20* 21*   20*   < > 23  --   --  26 26 27 24   GLUCOSE 79 135*   136*   < > 146*  --   --  113* 136* 155* 118*  BUN 75* 79*   79*   < > 79*  --   --  74* 74* 63* 58*  CREATININE 5.96* 5.55*   5.51*   < > 5.13*  --   --  4.53* 3.88* 3.24* 3.01*  CALCIUM 7.0* 7.0*   7.0*   < > 7.2*  --   --  7.4* 7.7* 8.1* 8.5*  MG 2.4 2.4  --  2.3  --   --  2.4  --   --   --   PHOS 9.0* 9.5*  --  9.6*  --   --  9.0* 6.9* 5.3*  --    < > = values in this interval not displayed.   Liver Function Tests: Recent Labs  Lab 12/18/21 2155 12/19/21 0151 12/22/21 0244 12/23/21 0857  AST 25 23  --   --   ALT 9 9  --   --   ALKPHOS 82 69  --   --   BILITOT 0.3 0.1*  --   --   PROT 8.2* 7.3  --   --   ALBUMIN 3.6 3.1*   3.1* 3.2* 3.3*   CBG: Recent Labs  Lab 12/23/21 0757 12/23/21 1227 12/23/21 1721 12/23/21 2111 12/24/21 0742  GLUCAP 121* 181* 119* 154* 117*    Discharge time spent: greater than 30 minutes.  Signed: Nita Sells, MD [facility] 12/24/2021

## 2021-12-24 NOTE — Progress Notes (Signed)
AVS given and reviewed with pt. Medications discussed. Signed, printed prescriptions provided to patient. All questions answered to satisfaction. Pt verbalized understanding of information given. PTAR to transport pt home with all belongings. ?

## 2021-12-24 NOTE — TOC Transition Note (Addendum)
Transition of Care (TOC) - CM/SW Discharge Note ? ? ?Patient Details  ?Name: Thomas [name] ?MRN: [id number] ?Date of Birth: Nov 12, 1950 ? ?Transition of Care (TOC) CM/SW Contact:  ?Trish Mage, LCSW ?Phone Number: ?12/24/2021, 10:56 AM ? ? ?Clinical Narrative:   Patient who is stable for d/c will return home today with [facility] services as provided by [facility]. 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 PTAR arranged.  No further needs identified.  TOC sign off. ? ? ? ?  ?Barriers to Discharge: Barriers Resolved ? ? ?Patient Goals and CMS Choice ?  ?CMS Medicare.gov Compare Post Acute Care list provided to:: Patient ?Choice offered to / list presented to : Patient ? ?Discharge Placement ?  ?           ?  ?  ?  ?  ? ?Discharge Plan and Services ?In-house Referral: Clinical Social Work ?  ?Post Acute Care Choice: Home Health          ?  ?  ?  ?  ?  ?  ?  ?  ?  ?  ? ?Social Determinants of Health (SDOH) Interventions ?  ? ? ?Readmission Risk Interventions ?Readmission Risk Prevention Plan 12/20/2021 12/12/2021 09/20/2021  ?Transportation Screening - Complete Complete  ?PCP or Specialist Appt within 5-7 Days - - -  ?Not Complete comments - - -  ?PCP or Specialist Appt within 3-5 Days - - Complete  ?Home Care Screening - - -  ?Medication Review (RN CM) - - -  ?[place] or Home Care Consult - - Complete  ?Social Work Consult for [place] Planning/Counseling - - Complete  ?Palliative Care Screening - - Not Applicable  ?Medication Review Press photographer) - Complete Complete  ?PCP or Specialist appointment within 3-5 days of discharge - Complete -  ?[place] or Home Care Consult Complete Complete -  ?SW Recovery Care/Counseling Consult Complete Complete -  ?Palliative Care Screening Not Applicable Not Applicable -  ?[place] Not Applicable Patient Refused -  ?Some recent data might be hidden  ? ? ? ? ? ?

## 2021-12-24 NOTE — Progress Notes (Signed)
Called pt brother with pt permission.  Alerted brother that patient has been discharged and will transport home via [facility].  Brother agreeable and is going to pts home to unlock door so patient can enter home when transported via [facility].  Pt notified and agreeable with plan.  ?

## 2021-12-24 NOTE — Progress Notes (Signed)
ANTICOAGULATION CONSULT NOTE ? ?Pharmacy Consult for Warfarin ?Indication: PE/chronic DVT (possible Eliquis failure) ? ?Allergies  ?Allergen Reactions  ? Other Other (See Comments)  ?  Blood pressure issues ?Per [facility] hospital: Pt states he can only take these pain meds or else he gets very sick: Oxycontin,morphine,demerol, and dilaudid are the only pain meds pt states he can take.  ?  ? Oxymorphone Other (See Comments)  ?  Causes kidney problems  ? Aspirin Nausea And Vomiting  ?  Per [facility]  ? Beta Vulgaris Nausea And Vomiting  ?  Beets  ? Buspirone Nausea And Vomiting and Other (See Comments)  ?  Affected his head  ? Cabbage Nausea And Vomiting  ? Codeine Other (See Comments)  ?  Unknown reaction  ? Dolobid [Diflunisal] Other (See Comments)  ?  Unknown reaction  ? Fish Allergy Nausea And Vomiting  ? Fish-Derived Products Nausea And Vomiting  ? Methadone Other (See Comments)  ?  Made him loopy  ? Pentazocine Other (See Comments)  ?  Unknown reaction  ? Propoxyphene Nausea And Vomiting and Other (See Comments)  ?  Messed with his head  ? Shellfish Allergy Nausea And Vomiting  ? Sulfa Antibiotics Hives  ? Sulfasalazine Other (See Comments)  ?  Unknown reaction  ? Vistaril [Hydroxyzine] Nausea And Vomiting and Other (See Comments)  ?  Didn't help  ? Amoxicillin Rash  ?  Per [facility]  ? ? ?Patient Measurements: ?Height: 6\' 5"  (195.6 cm) ?Weight: 116.8 kg (257 lb 8 oz) ?IBW/kg (Calculated) : 89.1 ?  ? ?Vital Signs: ?Temp: 98.2 ?F (36.8 ?C) (03/07 5170) ?Temp Source: Oral (03/07 0174) ?BP: 179/98 (03/07 9449) ?Pulse Rate: 63 (03/07 0337) ? ?Labs: ?Recent Labs  ?  12/22/21 ?0244 12/23/21 ?0435 12/23/21 ?0857 12/24/21 ?6759  ?HGB 9.4*  --   --   --   ?HCT 29.1*  --   --   --   ?PLT 231  --   --   --   ?LABPROT 25.0* 23.9*  --  24.5*  ?INR 2.3* 2.1*  --  2.2*  ?CREATININE 3.88*  --  3.24* 3.01*  ? ? ? ?Estimated Creatinine Clearance: 33.3 mL/min (A) (by C-G formula based on SCr of 3.01 mg/dL  (H)). ? ? ?Medical History: ?Past Medical History:  ?Diagnosis Date  ? Arthritis   ? BPH (benign prostatic hyperplasia) 11/24/2019  ? Chronic pain syndrome 11/24/2019  ? CKD (chronic kidney disease), stage IV ([place]) 11/24/2019  ? Diabetes mellitus without complication ([place])   ? Gout   ? Hypertension   ? Muscle pain   ? Physical deconditioning 11/24/2019  ? Scars   ? Swelling   ? ? ?Medications:  ?PTA Warfarin ? ?Assessment: ?71 yr male admitted with worsening renal function and c/o worsening pain ?Recent hospitalization for LLE cellulitis and worsening DVT (discharged 12/16/21) with instructions to take warfarin 7.5mg  daily x 3 days and then to have INR checked as an outpatient ?Patient reports last warfarin taken on 12/17/21 ?INR on admission (3/2) = 2.7 ? ?Goal of Therapy:  ?INR 2-3 ?Monitor platelets by anticoagulation protocol: Yes ?  ?Plan:  ?INR 2.2 - therapeutic ?Continue with warfarin 7.5 mg daily (expect this may be his maintenance dose requirement) ?Daily PT/INR while inpatient ? ?Dimple Nanas, PharmD ?12/24/2021 8:54 AM ? ? ? ?

## 2021-12-24 NOTE — Discharge Planning (Signed)
Oncology Discharge Planning Note ? ?[place] at [facility] ?Address: [address], [place], [place] [address] ?Hours of Operation:  8am - 5pm, Monday - Friday  ?Clinic Contact Information:  [phone]) [phone] ? ?Oncology Care Team: ?Medical Oncology Provider:  Dede Query ? ?Patient Details: ?Name:  Thomas [name], Thomas [name] ?MRN:   [id number] ?DOB:   [birth year] ?Reason for Current Admission: Hyperkalemia ? ?Discharge Planning Narrative: ?Discharge follow-up appointments for oncology will be available on the AVS and MyChart.   ?Upon discharge from the hospital, hematology/oncology's post discharge plan of care for the outpatient setting is: TBD  ? ?[name] will be called within two business days after discharge to review hematology/oncology's plan of care for full understanding.   ? ?Outpatient Oncology Specific Care Only: ?Oncology appointment transportation needs addressed?:  not applicable ?Oncology medication management for symptom management addressed?:  not applicable ?Chemo Alert Card reviewed?:  not applicable ?Immunotherapy Alert Card reviewed?:  not applicable ?

## 2021-12-24 NOTE — Evaluation (Signed)
Physical Therapy Evaluation ?Patient Details ?Name: Thomas [name] ?MRN: [id number] ?DOB: [birth year] ?Today's Date: 12/24/2021 ? ?History of Present Illness ? Patient is a 71 year old male who presented to the hosptial with back pain. patient had recent d/c on 2/27 from hospical with LLE cellulitis and worsening LLE DVT. patient was dount o have hyperkalemia, AKI, chronic hydronephrosis. PMH: chronic pain, back surgeries, DVT, PE, DM, CKD, R BKA, HR, gout  ?Clinical Impression ? The  patient did consent to getting up to recliner, declined to ambulate, "I want to eat  breakfast".  The patient does not have left shoee so is off balanced. Patient has not ambulated for ~ 1 week. Will return to ambulate and see how his balance is. Patient is to Dc home. Today. ?Pt admitted with above diagnosis.  Pt currently with functional limitations due to the deficits listed below (see PT Problem List). Pt will benefit from skilled PT to increase their independence and safety with mobility to allow discharge to the venue listed below.   ? ?   ? ?Recommendations for follow up therapy are one component of a multi-disciplinary discharge planning process, led by the attending physician.  Recommendations may be updated based on patient status, additional functional criteria and insurance authorization. ? ?Follow Up Recommendations Home health PT ? ?  ?Assistance Recommended at Discharge Set up Supervision/Assistance  ?Patient can return home with the following ? A little help with walking and/or transfers;A little help with bathing/dressing/bathroom;Assist for transportation;Help with stairs or ramp for entrance ? ?  ?Equipment Recommendations None recommended by PT  ?Recommendations for Other Services ?    ?  ?Functional Status Assessment Patient has had a recent decline in their functional status and demonstrates the ability to make significant improvements in function in a reasonable and predictable amount of time.  ? ?  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 ?  ? ?Mobility ? Bed Mobility ?Overal bed mobility: Modified Independent ?  ?  ?  ?  ?  ?  ?  ?  ? ?Transfers ?Overall transfer level: Needs assistance ?Equipment used: Rolling walker (2 wheels) ?Transfers: Sit to/from Stand ?  ?  ?  ?  ?  ? Lateral/Scoot Transfers: Min assist ?General transfer comment: Pt able to self DON his right prosthetic leg while seated EOB. does not have the left boot.. Bed raised to assist with stand from bed and steps to recliner, declined to ambulate.  Extra effort to power up to stand from recliner that had been built up with bed spreads. ?  ? ?Ambulation/Gait ?  ?  ?  ?  ?  ?  ?  ?  ? ?Stairs ?  ?  ?  ?  ?  ? ?Wheelchair Mobility ?  ? ?Modified Rankin (Stroke Patients Only) ?  ? ?  ? ?Balance Overall balance assessment: Needs assistance ?Sitting-balance support: Feet supported, No upper extremity supported ?Sitting balance-[name]: Good ?  ?  ?Standing balance support: During functional activity, Single extremity supported, Reliant on assistive device for balance ?Standing balance-[name]: Poor ?Standing balance comment: stood  to wash up, holds to Rw with 1 hand. unsteady, does not have right boot so off kilter. ?  ?  ?  ?  ?  ?  ?  ?  ?  ?  ?  ?   ? ? ? ?Pertinent  Vitals/Pain Pain Assessment ?Faces Pain Scale: Hurts little more ?Pain Location: Back from neck down  ? ? ?Home Living Family/patient expects to be discharged to:: Private residence ?Living Arrangements: Alone ?Available Help at Discharge: Available PRN/intermittently ?Type of Home: House ?Home Access: Stairs to enter ?Entrance Stairs-Rails: None ?Entrance Stairs-Number of Steps: 1 ?  ?Home Layout: One level ?Home Equipment: Conservation officer, nature (2 wheels);Cane - single point;Crutches ?   ?  ?Prior Function Prior Level of Function : Needs assist ?   ?  ?  ?  ?  ?  ?  ?ADLs Comments: patient reported being independent in ADLs. patient reported preferring sink bathing to showers. patient reported using RW at home for functional mobility. patient reported sleeping in recliner chair at home ?  ? ? ?Hand Dominance  ? Dominant Hand: Right ? ?  ?Extremity/Trunk Assessment  ? Upper Extremity Assessment ?Upper Extremity Assessment: Defer to OT evaluation ?  ? ?Lower Extremity Assessment ?Lower Extremity Assessment: Defer to PT evaluation ?  ? ?Cervical / Trunk Assessment ?Cervical / Trunk Assessment: Normal  ?Communication  ? Communication: No difficulties  ?Cognition   ?  ?  ?  ?  ?  ?  ?  ?  ?  ?  ?  ?  ?  ?  ?  ?  ?  ?  ?  ?  ?  ? ?  ?General Comments   ? ?  ?Exercises    ? ?Assessment/Plan  ?  ?PT Assessment Patient needs continued PT services  ?PT Problem List Decreased strength;Decreased mobility;Decreased activity tolerance;Decreased balance;Decreased knowledge of use of DME;Pain;Obesity ? ?   ?  ?PT Treatment Interventions DME instruction;Gait training;Therapeutic exercise;Balance training;Therapeutic activities;Patient/family education;Functional mobility training   ? ?PT Goals (Current goals can be found in the Care Plan section)  ?Acute Rehab PT Goals ?Patient Stated Goal: go home, walk around house ?PT Goal Formulation: With patient ?Time For Goal Achievement: 01/07/22 ?Potential to Achieve Goals: Fair ? ?  ?Frequency Min 3X/week ?  ? ? ?Co-evaluation   ?Reason for Co-Treatment: For patient/therapist safety ?PT goals addressed during session: Mobility/safety with mobility ?OT goals addressed during session: ADL's and self-care ?  ? ? ?  ?AM-PAC PT "6 Clicks" Mobility  ?Outcome Measure Help needed turning from your back to your side while in a flat bed without using bedrails?: None ?Help needed moving from lying on your back to sitting on the side of a flat bed without using bedrails?: None ?Help needed moving to and from a bed to a chair (including a  wheelchair)?: A Little ?Help needed standing up from a chair using your arms (e.g., wheelchair or bedside chair)?: A Little ?Help needed to walk in hospital room?: A Lot ?Help needed climbing 3-5 steps with a railing? : A Lot ?6 Click Score: 18 ? ?  ?End of Session Equipment Utilized During Treatment: Gait belt ?Activity Tolerance: Patient tolerated treatment well ?Patient left: in bed;with call bell/phone within reach;with bed alarm set ?Nurse Communication: Mobility status ?PT Visit Diagnosis: Muscle weakness (generalized) (M62.81);Difficulty in walking, not elsewhere classified (R26.2) ?  ? ?Time: 5329-9242 ?PT Time Calculation (min) (ACUTE ONLY): 25 min ? ? ?Charges:   PT Evaluation ?$PT Eval Low Complexity: 1 Low ?  ?  ?   ? ? ?Tresa Endo PT ?Acute Rehabilitation Services ?Pager [phone] ?Office [phone] ? ? ?Claretha Cooper ?12/24/2021, 9:00 AM ? ?

## 2021-12-25 ENCOUNTER — Telehealth (HOSPITAL_COMMUNITY): Payer: Self-pay | Admitting: *Deleted

## 2021-12-25 NOTE — Telephone Encounter (Signed)
Attempted to contact patient regarding post hospital follow up and telephone assessment.  Will continue to touch base with him. ?

## 2021-12-27 ENCOUNTER — Telehealth: Payer: Self-pay | Admitting: *Deleted

## 2021-12-27 NOTE — Telephone Encounter (Signed)
Thomas [name] was contacted by telephone to verify understanding of discharge instructions status post their most recent discharge from the hospital on the date:  12/24/21.  Inpatient discharge AVS was re-reviewed with patient, along with cancer center appointments.  Verification of understanding for oncology specific follow-up was validated using the Teach Back method.  Patient states feeling well and very pleased with the care he received at [facility]. ? ?Transportation to appointments were confirmed for the patient as being self/caregiver. ? ?[name] questions were addressed to their satisfaction upon completion of this post discharge follow-up call for outpatient oncology.  ?

## 2021-12-30 ENCOUNTER — Emergency Department (HOSPITAL_COMMUNITY): Payer: Medicare Other

## 2021-12-30 ENCOUNTER — Inpatient Hospital Stay (HOSPITAL_COMMUNITY)
Admission: EM | Admit: 2021-12-30 | Discharge: 2022-01-03 | DRG: 603 | Disposition: A | Discharge status: Home / Self Care | Payer: Medicare Other | Attending: Family Medicine | Admitting: Family Medicine

## 2021-12-30 ENCOUNTER — Other Ambulatory Visit: Payer: Self-pay

## 2021-12-30 ENCOUNTER — Encounter (HOSPITAL_COMMUNITY): Payer: Self-pay | Admitting: Emergency Medicine

## 2021-12-30 DIAGNOSIS — M109 Gout, unspecified: Secondary | ICD-10-CM | POA: Diagnosis present

## 2021-12-30 DIAGNOSIS — L03116 Cellulitis of left lower limb: Secondary | ICD-10-CM | POA: Diagnosis present

## 2021-12-30 DIAGNOSIS — E1122 Type 2 diabetes mellitus with diabetic chronic kidney disease: Secondary | ICD-10-CM | POA: Diagnosis present

## 2021-12-30 DIAGNOSIS — E669 Obesity, unspecified: Secondary | ICD-10-CM | POA: Diagnosis present

## 2021-12-30 DIAGNOSIS — Z86718 Personal history of other venous thrombosis and embolism: Secondary | ICD-10-CM | POA: Diagnosis not present

## 2021-12-30 DIAGNOSIS — R9431 Abnormal electrocardiogram [ECG] [EKG]: Secondary | ICD-10-CM | POA: Diagnosis present

## 2021-12-30 DIAGNOSIS — Z95828 Presence of other vascular implants and grafts: Secondary | ICD-10-CM

## 2021-12-30 DIAGNOSIS — Z7901 Long term (current) use of anticoagulants: Secondary | ICD-10-CM

## 2021-12-30 DIAGNOSIS — R609 Edema, unspecified: Principal | ICD-10-CM

## 2021-12-30 DIAGNOSIS — N185 Chronic kidney disease, stage 5: Secondary | ICD-10-CM | POA: Diagnosis present

## 2021-12-30 DIAGNOSIS — N390 Urinary tract infection, site not specified: Secondary | ICD-10-CM

## 2021-12-30 DIAGNOSIS — N3000 Acute cystitis without hematuria: Secondary | ICD-10-CM

## 2021-12-30 DIAGNOSIS — E119 Type 2 diabetes mellitus without complications: Secondary | ICD-10-CM

## 2021-12-30 DIAGNOSIS — N400 Enlarged prostate without lower urinary tract symptoms: Secondary | ICD-10-CM | POA: Diagnosis present

## 2021-12-30 DIAGNOSIS — Z7984 Long term (current) use of oral hypoglycemic drugs: Secondary | ICD-10-CM | POA: Diagnosis not present

## 2021-12-30 DIAGNOSIS — Z79899 Other long term (current) drug therapy: Secondary | ICD-10-CM

## 2021-12-30 DIAGNOSIS — Z20822 Contact with and (suspected) exposure to covid-19: Secondary | ICD-10-CM | POA: Diagnosis present

## 2021-12-30 DIAGNOSIS — I132 Hypertensive heart and chronic kidney disease with heart failure and with stage 5 chronic kidney disease, or end stage renal disease: Secondary | ICD-10-CM | POA: Diagnosis present

## 2021-12-30 DIAGNOSIS — I5042 Chronic combined systolic (congestive) and diastolic (congestive) heart failure: Secondary | ICD-10-CM | POA: Diagnosis present

## 2021-12-30 DIAGNOSIS — N179 Acute kidney failure, unspecified: Secondary | ICD-10-CM | POA: Diagnosis present

## 2021-12-30 DIAGNOSIS — J189 Pneumonia, unspecified organism: Secondary | ICD-10-CM | POA: Diagnosis not present

## 2021-12-30 DIAGNOSIS — G8929 Other chronic pain: Secondary | ICD-10-CM | POA: Diagnosis present

## 2021-12-30 DIAGNOSIS — Z79890 Hormone replacement therapy (postmenopausal): Secondary | ICD-10-CM | POA: Diagnosis not present

## 2021-12-30 DIAGNOSIS — Z91119 Patient's noncompliance with dietary regimen due to unspecified reason: Secondary | ICD-10-CM

## 2021-12-30 DIAGNOSIS — Z86711 Personal history of pulmonary embolism: Secondary | ICD-10-CM

## 2021-12-30 DIAGNOSIS — Z8249 Family history of ischemic heart disease and other diseases of the circulatory system: Secondary | ICD-10-CM | POA: Diagnosis not present

## 2021-12-30 DIAGNOSIS — Z89511 Acquired absence of right leg below knee: Secondary | ICD-10-CM | POA: Diagnosis not present

## 2021-12-30 DIAGNOSIS — Z6831 Body mass index (BMI) 31.0-31.9, adult: Secondary | ICD-10-CM

## 2021-12-30 DIAGNOSIS — I10 Essential (primary) hypertension: Secondary | ICD-10-CM | POA: Diagnosis present

## 2021-12-30 DIAGNOSIS — E039 Hypothyroidism, unspecified: Secondary | ICD-10-CM | POA: Diagnosis present

## 2021-12-30 DIAGNOSIS — J188 Other pneumonia, unspecified organism: Secondary | ICD-10-CM | POA: Diagnosis present

## 2021-12-30 LAB — URINALYSIS, ROUTINE W REFLEX MICROSCOPIC
Bilirubin Urine: NEGATIVE
Glucose, UA: NEGATIVE mg/dL
Ketones, ur: NEGATIVE mg/dL
Nitrite: NEGATIVE
Protein, ur: 100 mg/dL — AB
Specific Gravity, Urine: 1.01 (ref 1.005–1.030)
pH: 5 (ref 5.0–8.0)

## 2021-12-30 LAB — PROTIME-INR
INR: 3.5 — ABNORMAL HIGH (ref 0.8–1.2)
Prothrombin Time: 35.3 seconds — ABNORMAL HIGH (ref 11.4–15.2)

## 2021-12-30 LAB — CBC WITH DIFFERENTIAL/PLATELET
Abs Immature Granulocytes: 0.02 10*3/uL (ref 0.00–0.07)
Basophils Absolute: 0 10*3/uL (ref 0.0–0.1)
Basophils Relative: 0 %
Eosinophils Absolute: 0.3 10*3/uL (ref 0.0–0.5)
Eosinophils Relative: 4 %
HCT: 29.2 % — ABNORMAL LOW (ref 39.0–52.0)
Hemoglobin: 9 g/dL — ABNORMAL LOW (ref 13.0–17.0)
Immature Granulocytes: 0 %
Lymphocytes Relative: 17 %
Lymphs Abs: 1.3 10*3/uL (ref 0.7–4.0)
MCH: 31.5 pg (ref 26.0–34.0)
MCHC: 30.8 g/dL (ref 30.0–36.0)
MCV: 102.1 fL — ABNORMAL HIGH (ref 80.0–100.0)
Monocytes Absolute: 0.6 10*3/uL (ref 0.1–1.0)
Monocytes Relative: 8 %
Neutro Abs: 5.3 10*3/uL (ref 1.7–7.7)
Neutrophils Relative %: 71 %
Platelets: 186 10*3/uL (ref 150–400)
RBC: 2.86 MIL/uL — ABNORMAL LOW (ref 4.22–5.81)
RDW: 15.3 % (ref 11.5–15.5)
WBC: 7.4 10*3/uL (ref 4.0–10.5)
nRBC: 0 % (ref 0.0–0.2)

## 2021-12-30 LAB — COMPREHENSIVE METABOLIC PANEL
ALT: 9 U/L (ref 0–44)
AST: 28 U/L (ref 15–41)
Albumin: 3.5 g/dL (ref 3.5–5.0)
Alkaline Phosphatase: 86 U/L (ref 38–126)
Anion gap: 12 (ref 5–15)
BUN: 91 mg/dL — ABNORMAL HIGH (ref 8–23)
CO2: 19 mmol/L — ABNORMAL LOW (ref 22–32)
Calcium: 7.6 mg/dL — ABNORMAL LOW (ref 8.9–10.3)
Chloride: 105 mmol/L (ref 98–111)
Creatinine, Ser: 4.47 mg/dL — ABNORMAL HIGH (ref 0.61–1.24)
GFR, Estimated: 14 mL/min — ABNORMAL LOW (ref >60)
Glucose, Bld: 122 mg/dL — ABNORMAL HIGH (ref 70–99)
Potassium: 4.9 mmol/L (ref 3.5–5.1)
Sodium: 136 mmol/L (ref 135–145)
Total Bilirubin: 0.9 mg/dL (ref 0.3–1.2)
Total Protein: 8.8 g/dL — ABNORMAL HIGH (ref 6.5–8.1)

## 2021-12-30 LAB — RESP PANEL BY RT-PCR (FLU A&B, COVID) ARPGX2
Influenza A by PCR: NEGATIVE
Influenza B by PCR: NEGATIVE
SARS Coronavirus 2 by RT PCR: NEGATIVE

## 2021-12-30 LAB — LIPASE, BLOOD: Lipase: 47 U/L (ref 11–51)

## 2021-12-30 LAB — BRAIN NATRIURETIC PEPTIDE: B Natriuretic Peptide: 223.7 pg/mL — ABNORMAL HIGH (ref 0.0–100.0)

## 2021-12-30 LAB — MAGNESIUM: Magnesium: 2.2 mg/dL (ref 1.7–2.4)

## 2021-12-30 LAB — LACTIC ACID, PLASMA: Lactic Acid, Venous: 0.9 mmol/L (ref 0.5–1.9)

## 2021-12-30 IMAGING — CT CT ABD-PELV W/O CM
2 of 4 series · 15 of 46 positions shown, 17 images · non-contrast
Comparison: CT abdomen pelvis [DATE], CT renal [DATE]

CLINICAL DATA: Bowel obstruction suspected Abdominal distention,
pain, nausea, decreased bowel movement for the last 2 days. Also
chills and left leg worsened redness concerning for worsening
cellulitis. Patient has CKD and will likely not be able to get
contrast IV.



[Series 2: axial st · axial · 0.87mm/px · z∈[-504,-49]mm · 12 of 103 slices shown, 14 images]
[im 6/103  soft-tissue]
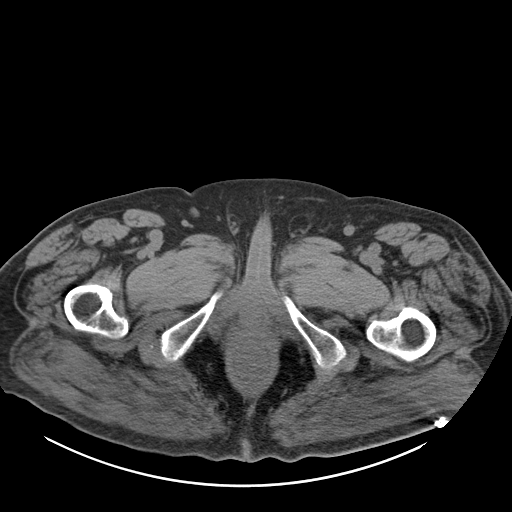
[im 6/103  bone]
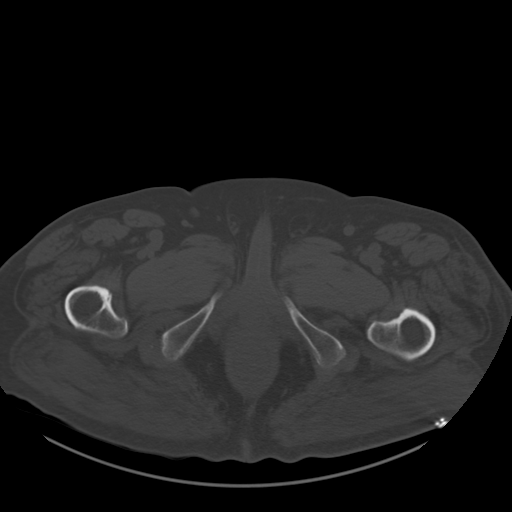
[im 16/103  soft-tissue]
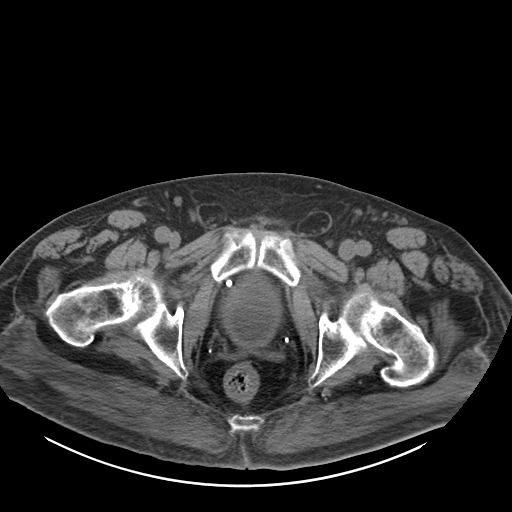
[im 21/103  soft-tissue]
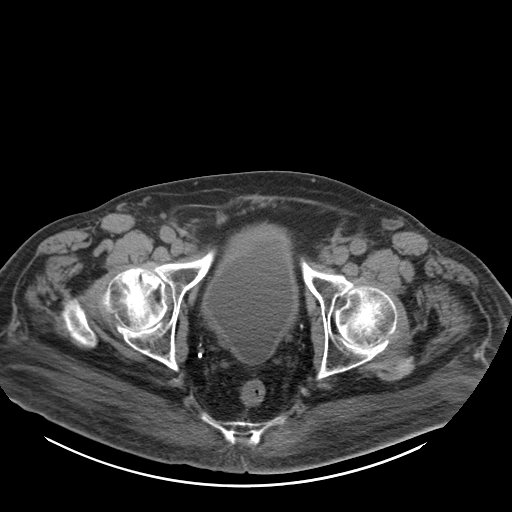
[im 31/103  soft-tissue]
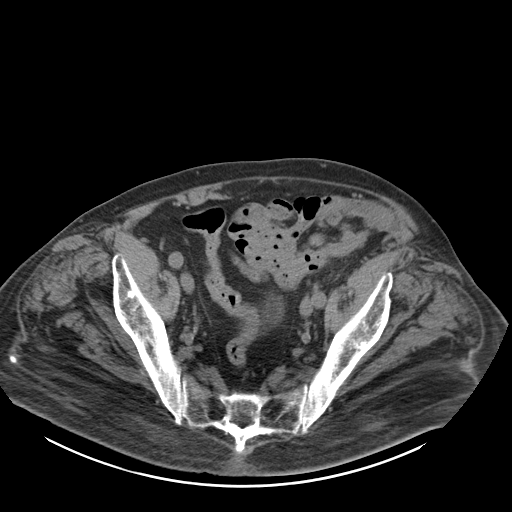
[im 41/103  soft-tissue]
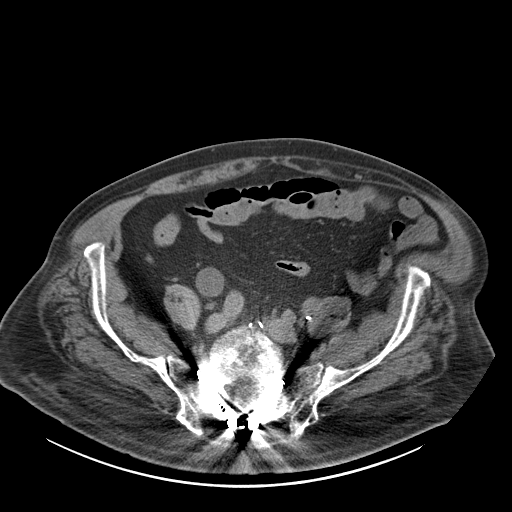
[im 46/103  soft-tissue]
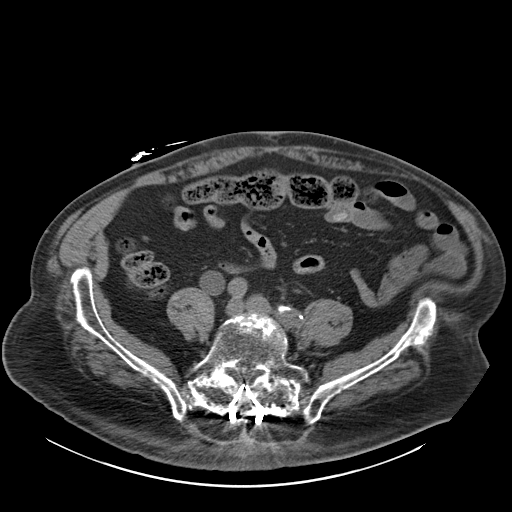
[im 57/103  soft-tissue]
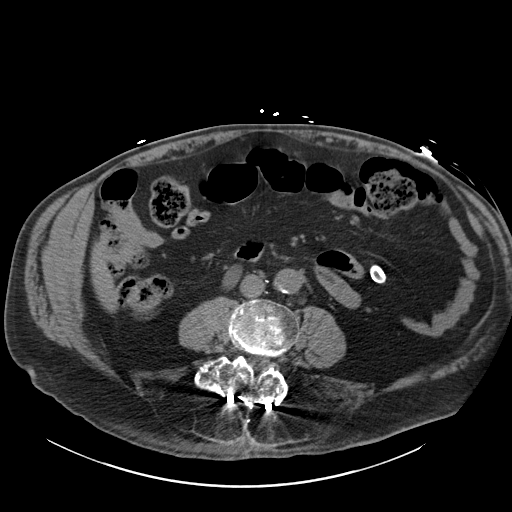
[im 62/103  soft-tissue]
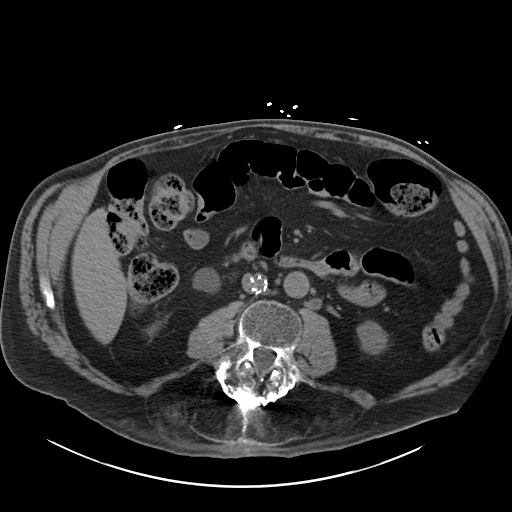
[im 72/103  soft-tissue]
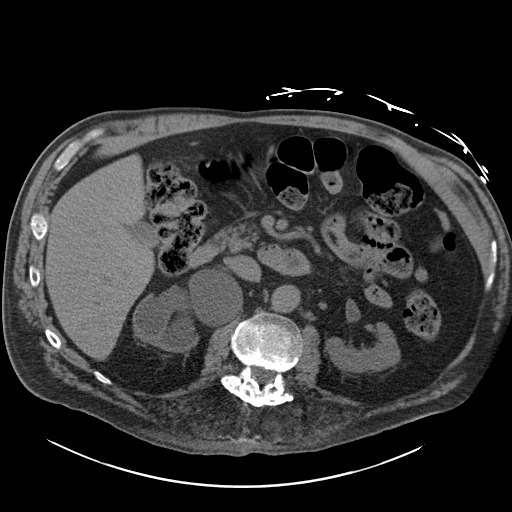
[im 72/103  bone]
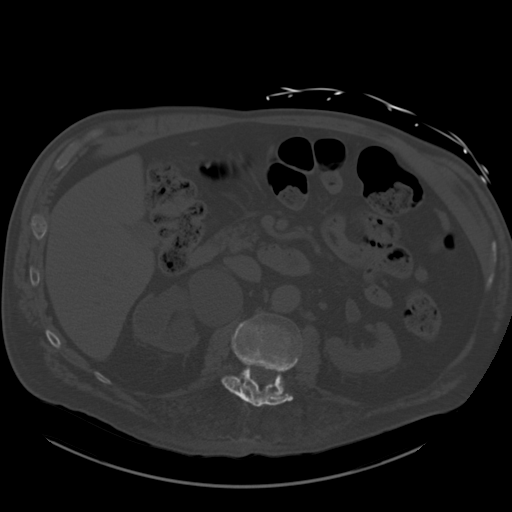
[im 82/103  soft-tissue]
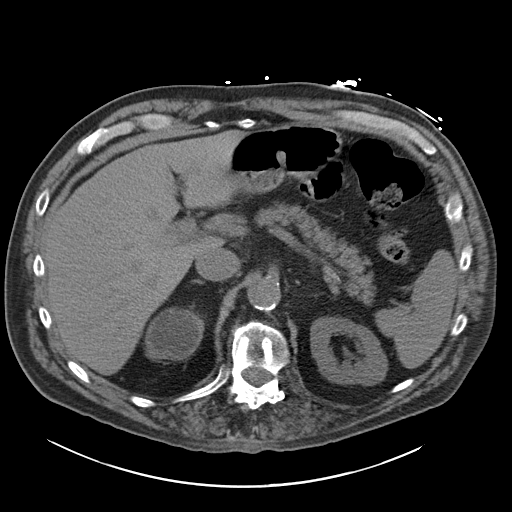
[im 87/103  soft-tissue]
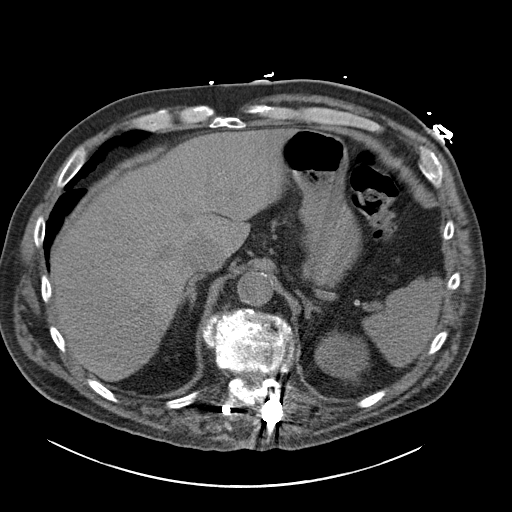
[im 97/103  soft-tissue]
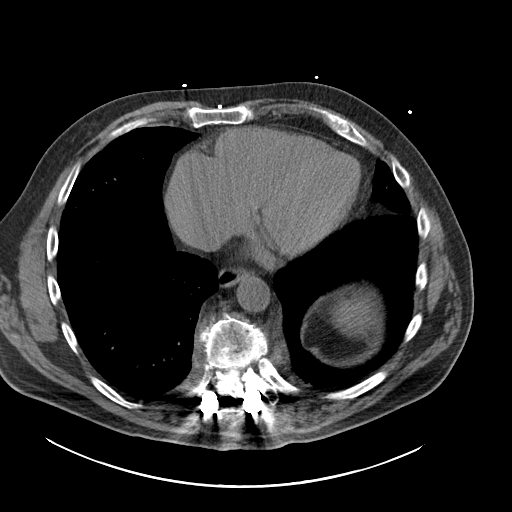

[Series 4: coronal st · coronal · 0.97mm/px · 3 of 181 slices shown]
[im 61/181  soft-tissue]
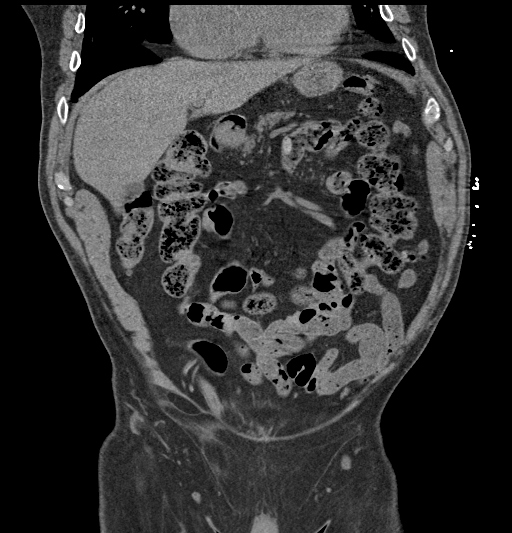
[im 81/181  soft-tissue]
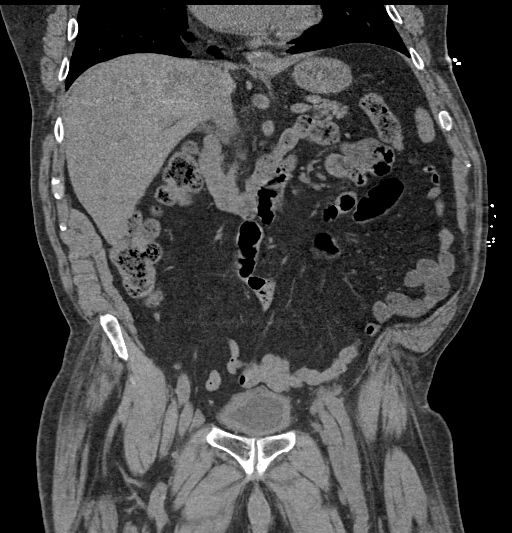
[im 101/181  soft-tissue]
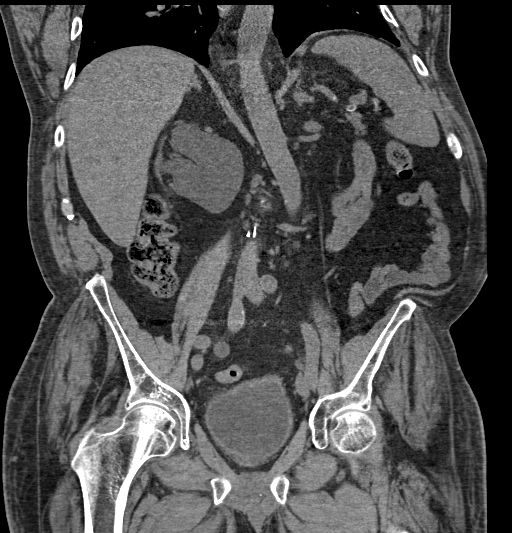

[15 of 46 positions shown; findings below may reference images not displayed]

FINDINGS: Lower chest: No acute abnormality.

Hepatobiliary: No focal liver abnormality. No gallstones,
gallbladder wall thickening, or pericholecystic fluid. No biliary
dilatation.

Pancreas: No focal lesion. Normal pancreatic contour. No surrounding
inflammatory changes. No main pancreatic ductal dilatation.

Spleen: Normal in size without focal abnormality.

Adrenals/Urinary Tract:

No adrenal nodule bilaterally.

Severe right hydroureteronephrosis with associated right parenchymal
thinning. Transition point noted at the distal right ureter ([DATE]).
No definite contour-deforming renal mass.

No nephrolithiasis or ureterolithiasis. No left hydronephrosis or
hydroureter.

Vague perivesicular fat stranding.

Stomach/Bowel: Stomach is within normal limits. No evidence of bowel
wall thickening or dilatation. Redundant sigmoid colon. Stool
throughout the ascending colon and transverse colon. Appendix
appears normal.

Vascular/Lymphatic: Inferior vena cava filter noted in similar
position with tip along the left inferior vena cava wall ([DATE]). No
abdominal aorta or iliac aneurysm. Mild atherosclerotic plaque of
the aorta and its branches. No abdominal, pelvic, or inguinal
lymphadenopathy.

Reproductive: Prostate is unremarkable.

Other: No intraperitoneal free fluid. No intraperitoneal free gas.
No organized fluid collection.

Musculoskeletal:

Subcutaneus soft tissue edema along the mid lower back. No definite
dermal thickening. No abdominal wall hernia or abnormality.

No suspicious lytic or blastic osseous lesions. No acute displaced
fracture. Dextroscoliosis of the lumbar spine. Multilevel spinous
process and facet joint effusion. Thoracic and lumbar surgical
hardware. Severe degenerative changes at the T10-11 level. Chronic
vertebral body height loss of the visualized thoracic levels as well
as L1 vertebral body.
IMPRESSION: 1. Vague perivesicular fat stranding. Correlate with urinalysis for
infection.
2. Severe right hydronephrosis with associated right parenchymal
thinning. Transition point noted at the distal right ureter. This
likely reflects chronic changes of obstructive uropathy in the
setting of stricture or underlying mass lesion-limited evaluation on
this noncontrast study.
3.  Aortic Atherosclerosis ([SG]-[SG]).

## 2021-12-30 IMAGING — DX DG TIBIA/FIBULA 2V*L*
4 series · 4 of 4 positions shown · non-contrast
Comparison: None.

CLINICAL DATA: Redness

EXAM:
LEFT TIBIA AND FIBULA - 2 VIEW

[tibia ap (1 of 2)]
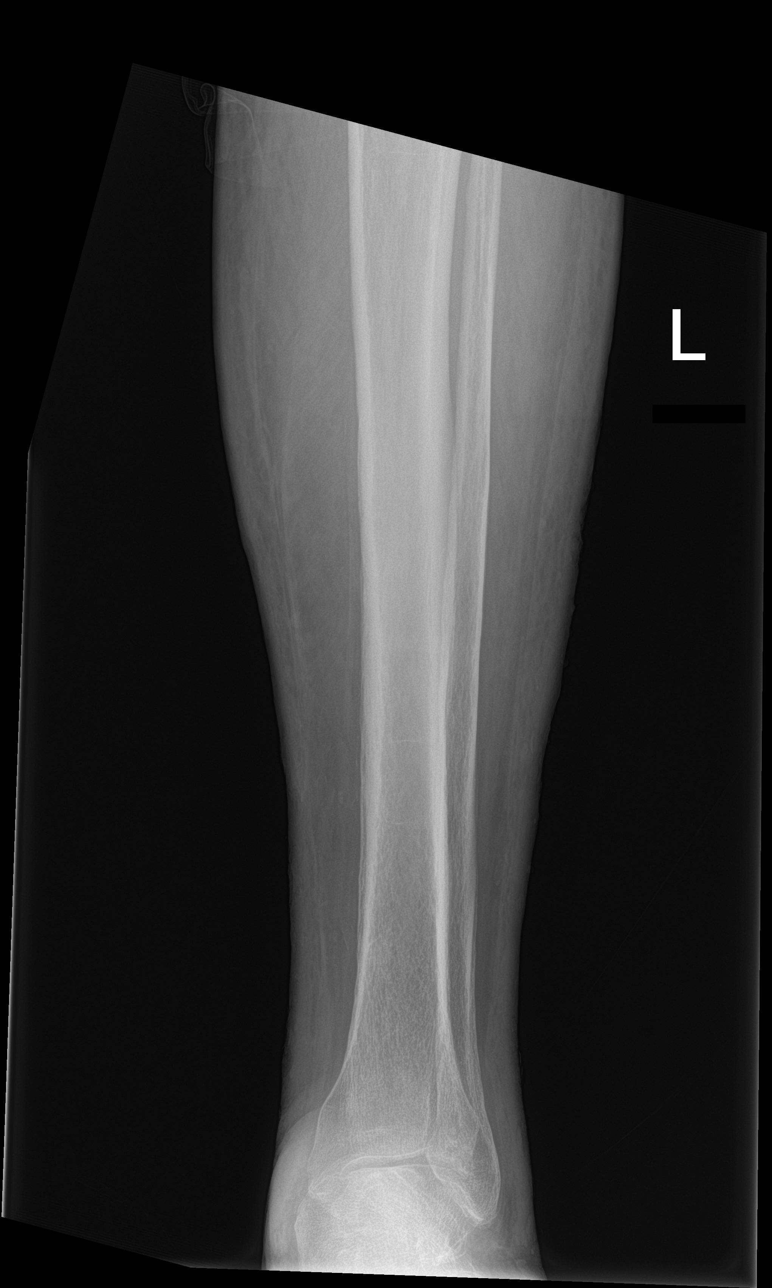

[tibia ap (2 of 2)]
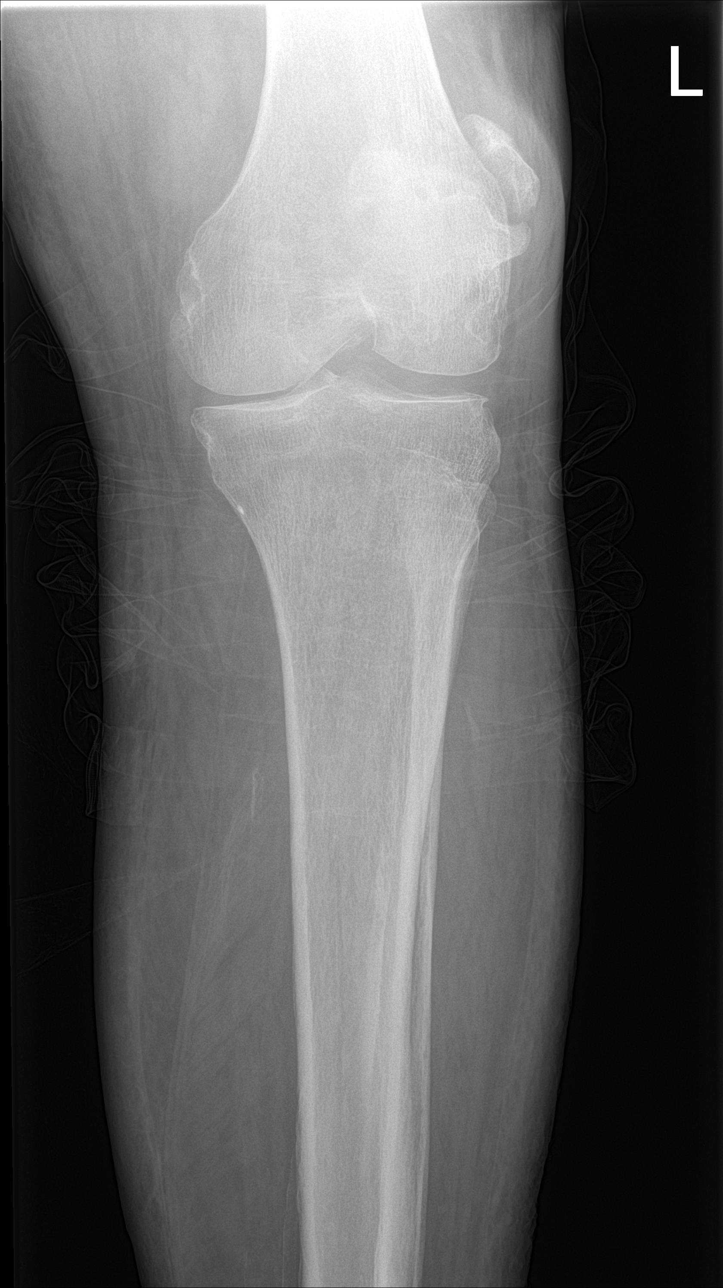

[tibia lat (1 of 2)]
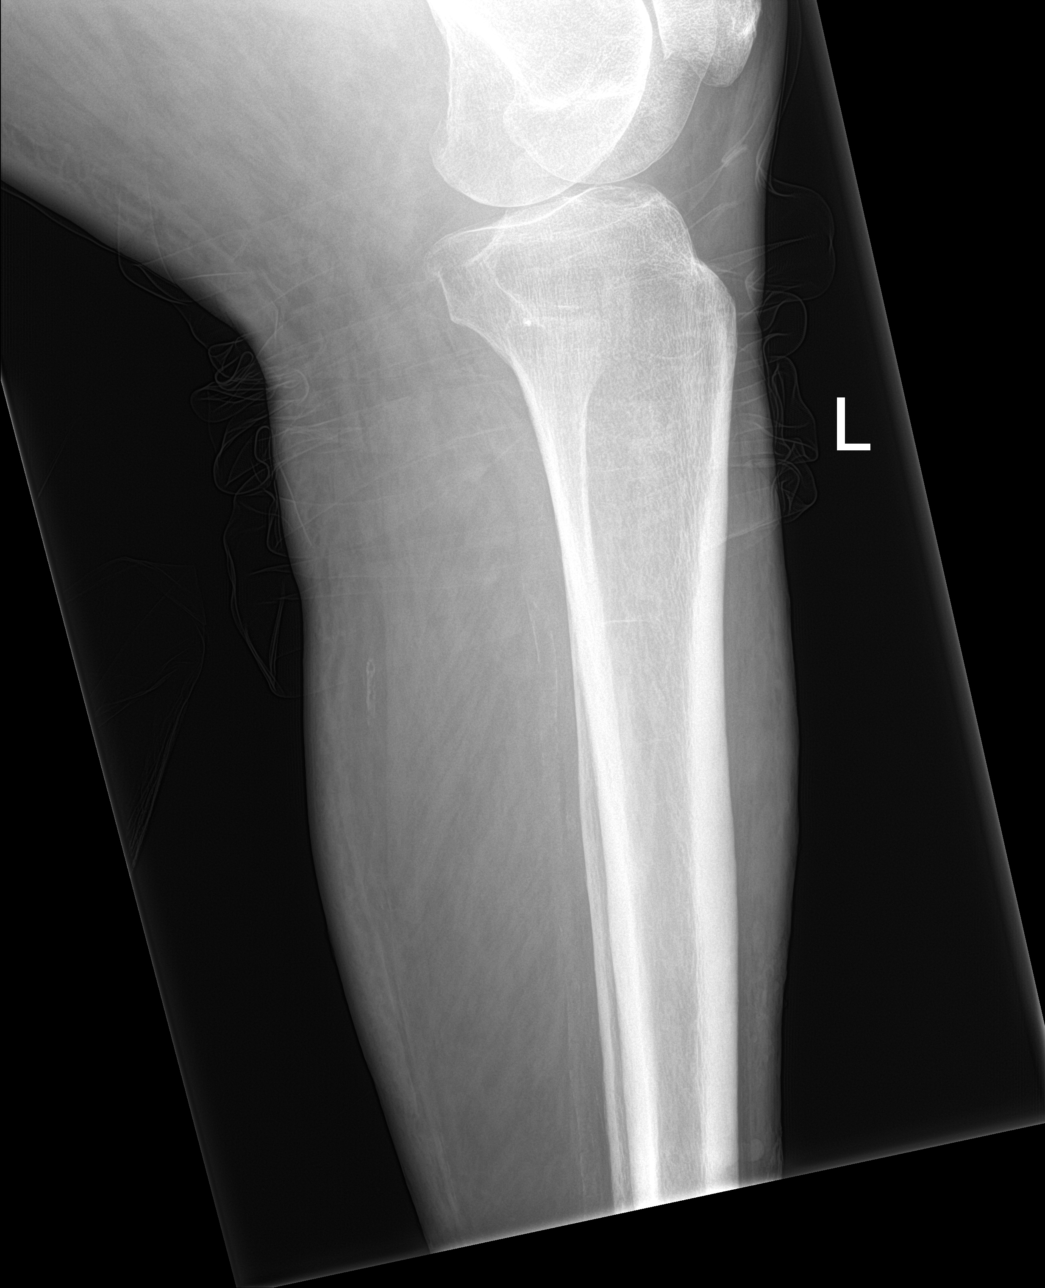

[tibia lat (2 of 2)]
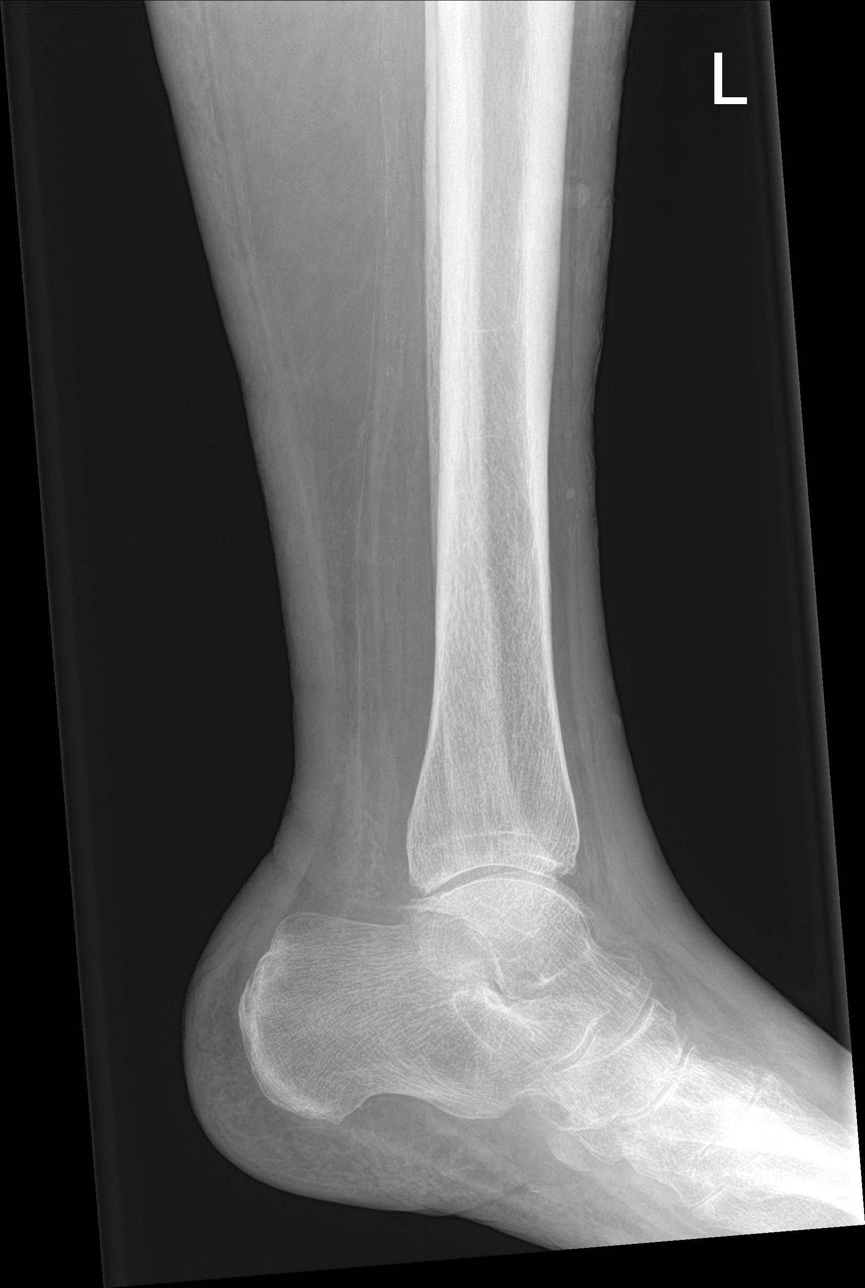

[4 of 4 positions shown; findings below may reference images not displayed]

FINDINGS: Joint space narrowing in the knee joint. No acute bony abnormality.
Specifically, no fracture, subluxation, or dislocation. No bone
destruction to suggest osteomyelitis. No soft tissue gas.
IMPRESSION: No acute bony abnormality.

## 2021-12-30 IMAGING — DX DG CHEST 1V PORT
1 series · 1 of 1 positions shown · non-contrast
Comparison: [DATE]

CLINICAL DATA: Chills, fatigue.  Cough, shortness of breath

EXAM:
PORTABLE CHEST 1 VIEW

[chest ap]
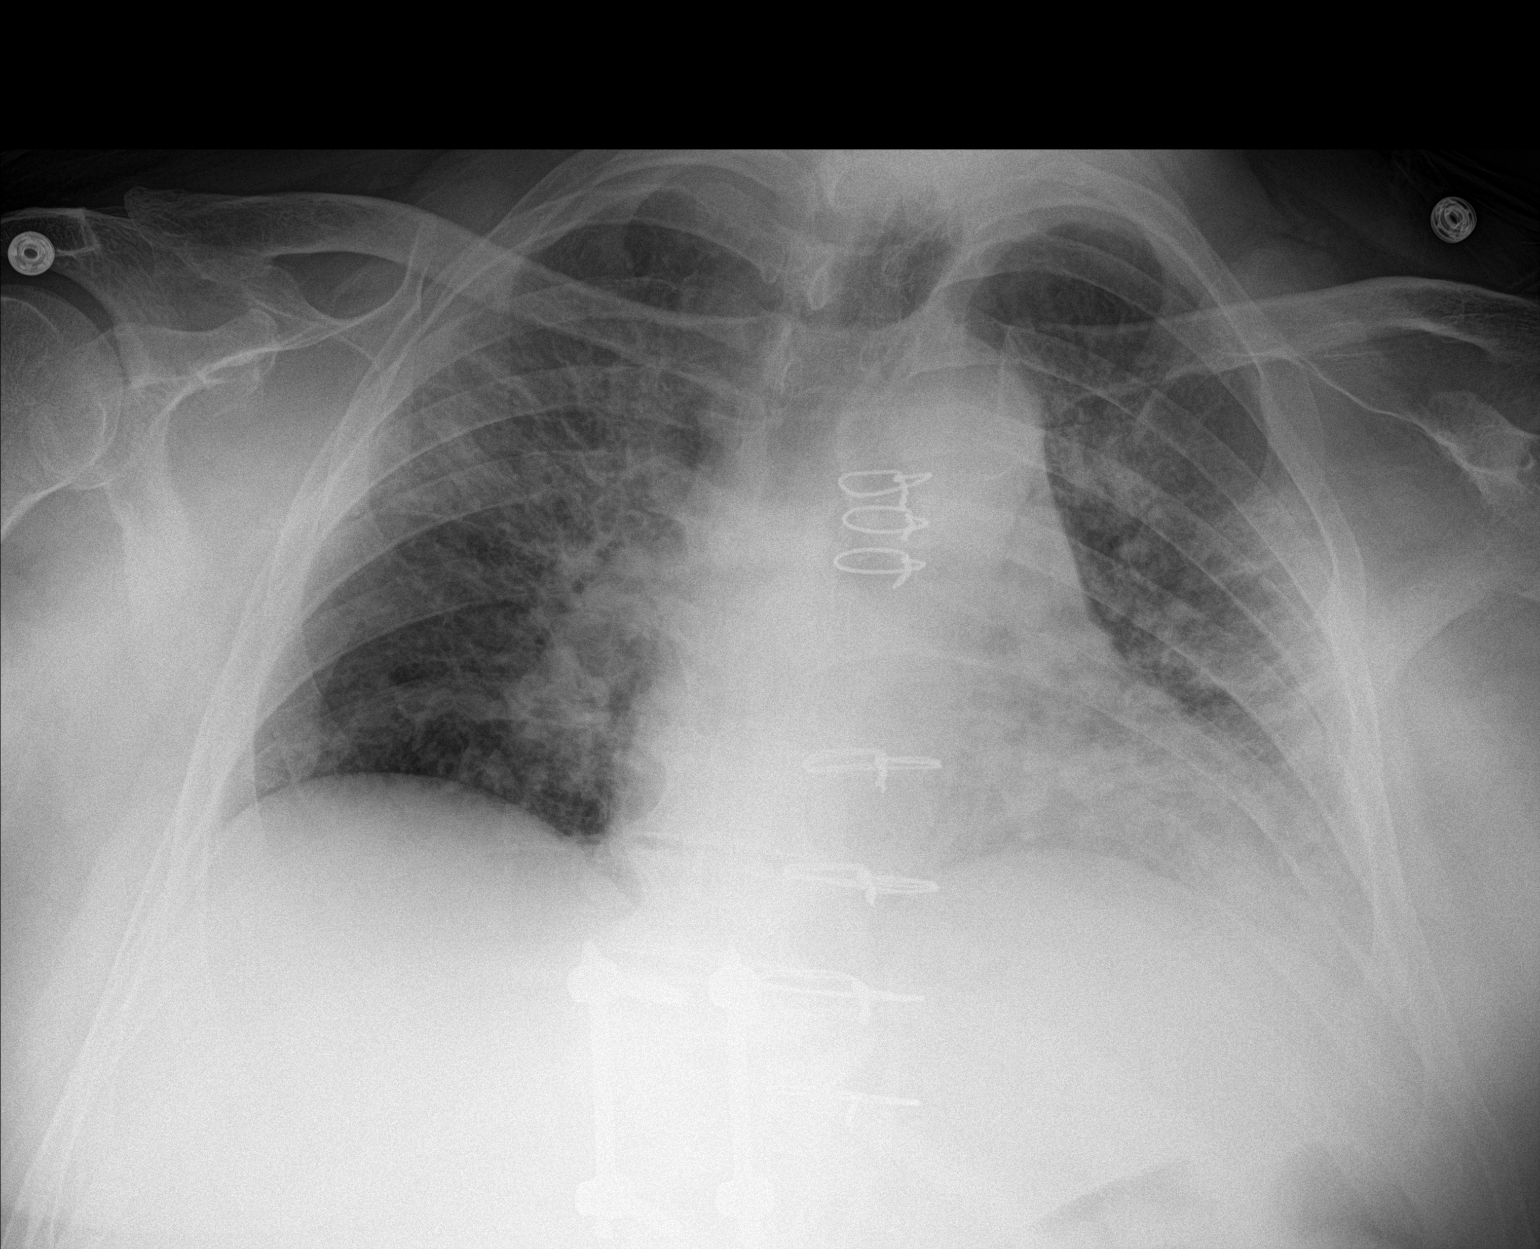

[1 of 1 positions shown; findings below may reference images not displayed]

FINDINGS: Prior median sternotomy. Heart is mildly enlarged. Aortic
atherosclerosis. Patchy airspace opacities throughout the left lung
and in the right upper lobe
IMPRESSION: Cardiomegaly.

Bilateral airspace disease concerning for pneumonia.

## 2021-12-30 MED ORDER — AZITHROMYCIN 500 MG IV SOLR
500.0000 mg | INTRAVENOUS | Status: DC
  Administered 2021-12-30: 500 mg via INTRAVENOUS
  Filled 2021-12-30 (×2): qty 5

## 2021-12-30 MED ORDER — SODIUM CHLORIDE 0.9 % IV SOLN
2.0000 g | INTRAVENOUS | Status: DC
  Administered 2021-12-30: 2 g via INTRAVENOUS
  Filled 2021-12-30: qty 20

## 2021-12-30 MED ORDER — MORPHINE SULFATE (PF) 4 MG/ML IV SOLN
4.0000 mg | Freq: Once | INTRAVENOUS | Status: AC
  Administered 2021-12-30: 4 mg via INTRAVENOUS
  Filled 2021-12-30: qty 1

## 2021-12-30 NOTE — ED Notes (Signed)
Pulses heard with doppler in left foot.  ?

## 2021-12-30 NOTE — ED Triage Notes (Signed)
BIBA ?Per EMS: Pt reports left lower leg edema, redness, loss of sensation x3week.  ?VSS ? ?

## 2021-12-30 NOTE — ED Provider Notes (Incomplete)
[facility] DEPT Provider Note   CSN: [id number] Arrival date & time: 12/30/21  1705     History {Add pertinent medical, surgical, social history, OB history to HPI:1} Chief Complaint  Patient presents with   Leg Swelling    Thomas [name] is a 71 y.o. male.  The history is provided by the patient and medical records. No language interpreter was used.  Leg Pain Location:  Leg Time since incident:  3 weeks Injury: no   Pain details:    Quality:  Aching   Radiates to:  Does not radiate   Severity:  Severe   Onset quality:  Gradual   Timing:  Constant   Progression:  Worsening Chronicity:  New Tetanus status:  Unknown Prior injury to area:  Unable to specify Relieved by:  Nothing Worsened by:  Nothing Ineffective treatments:  None tried Associated symptoms: back pain (chronic) and fatigue   Associated symptoms: no fever and no neck pain       Home Medications Prior to Admission medications   Medication Sig Start Date End Date Taking? Authorizing Provider  amLODipine (NORVASC) 5 MG tablet Take 5 mg by mouth every morning. 11/26/21   [provider]  atorvastatin (LIPITOR) 80 MG tablet Take 1 tablet (80 mg total) by mouth daily. 09/25/20   Jose Persia, MD  bisacodyl (DULCOLAX) 10 MG suppository Place 10 mg rectally daily.    [provider]  ferrous sulfate 325 (65 FE) MG tablet Take 1 tablet (325 mg total) by mouth daily with breakfast. 12/15/21 01/14/22  Antonieta Pert, MD  finasteride (PROSCAR) 5 MG tablet Take 1 tablet (5 mg total) by mouth daily. Patient taking differently: Take 5 mg by mouth every morning. 10/10/19   Hosie Poisson, MD  gabapentin (NEURONTIN) 100 MG capsule Take 1 capsule (100 mg total) by mouth 3 (three) times daily. 12/24/21   Nita Sells, MD  glipiZIDE (GLUCOTROL XL) 10 MG 24 hr tablet Take 10 mg by mouth daily as needed (CBG >120).    [provider]  lactulose (CHRONULAC) 10 GM/15ML  solution Take by mouth 2 (two) times daily as needed (constipation).    [provider]  levothyroxine (SYNTHROID) 50 MCG tablet Take 50 mcg by mouth daily before breakfast.     [provider]  metoprolol tartrate (LOPRESSOR) 50 MG tablet Take 1 tablet (50 mg total) by mouth 2 (two) times daily. 12/24/21   Nita Sells, MD  naloxegol oxalate (MOVANTIK) 25 MG TABS tablet Take 25 mg by mouth every morning.    [provider]  oxyCODONE (OXY IR/ROXICODONE) 5 MG immediate release tablet Take 1-2 tablets (5-10 mg total) by mouth every 4 (four) hours as needed for severe pain. 12/24/21   Nita Sells, MD  pantoprazole (PROTONIX) 40 MG tablet Take 1 tablet by mouth 2 times daily. Patient not taking: Reported on 12/07/2021 10/01/21 10/31/21  Kayleen Memos, DO  senna-docusate (SENOKOT-S) 8.6-50 MG tablet Take 1 tablet by mouth 2 (two) times daily for 7 days. 12/24/21 12/31/21  Nita Sells, MD  sodium zirconium cyclosilicate (LOKELMA) 10 g PACK packet Take 10 g by mouth daily. 12/24/21   Nita Sells, MD  tamsulosin (FLOMAX) 0.4 MG CAPS capsule Take 1 capsule (0.4 mg total) by mouth daily. 09/25/20   Jose Persia, MD  tiZANidine (ZANAFLEX) 4 MG tablet Take 4 mg by mouth every 8 (eight) hours as needed for muscle spasms. 11/26/21   [provider]  warfarin (COUMADIN) 2.5 MG  tablet Take 3 tablets (7.5 mg total) by mouth one time only at 4 PM. INR check in 3 days and adjust coumadin dose for INR of 2-3 Patient not taking: Reported on 12/19/2021 12/14/21 01/13/22  Antonieta Pert, MD      Allergies    Other, Oxymorphone, Aspirin, Beta vulgaris, Buspirone, Cabbage, Codeine, Dolobid [diflunisal], Fish allergy, Fish-derived products, Methadone, Pentazocine, Propoxyphene, Shellfish allergy, Sulfa antibiotics, Sulfasalazine, Vistaril [hydroxyzine], and Amoxicillin    Review of Systems   Review of Systems  Constitutional:  Positive for chills and fatigue. Negative  for fever.  HENT:  Negative for congestion.   Eyes:  Negative for visual disturbance.  Respiratory:  Negative for cough, chest tightness, shortness of breath and wheezing.   Cardiovascular:  Positive for leg swelling. Negative for chest pain and palpitations.  Gastrointestinal:  Positive for abdominal distention, abdominal pain, constipation and nausea. Negative for vomiting.  Genitourinary:  Negative for dysuria, flank pain and frequency.  Musculoskeletal:  Positive for back pain (chronic). Negative for neck pain and neck stiffness.  Skin:  Positive for wound.  Neurological:  Positive for numbness (leg numbness at baseline by report). Negative for headaches.  Psychiatric/Behavioral:  Negative for agitation and confusion.   All other systems reviewed and are negative.  Physical Exam Updated Vital Signs BP (!) 175/81 (BP Location: Left Arm)    Pulse 71    Temp 98.6 F (37 C) (Oral)    Resp 20    SpO2 100%  Physical Exam Vitals and nursing note reviewed.  Constitutional:      General: He is not in acute distress.    Appearance: He is obese. He is not ill-appearing, toxic-appearing or diaphoretic.  HENT:     Nose: No congestion or rhinorrhea.     Mouth/Throat:     Mouth: Mucous membranes are moist.     Pharynx: No oropharyngeal exudate or posterior oropharyngeal erythema.  Eyes:     Pupils: Pupils are equal, round, and reactive to light.  Cardiovascular:     Rate and Rhythm: Normal rate.  Pulmonary:     Effort: Pulmonary effort is normal.     Breath sounds: No wheezing, rhonchi or rales.  Chest:     Chest wall: No tenderness.  Abdominal:     General: There is distension.     Tenderness: There is generalized abdominal tenderness. There is no right CVA tenderness, left CVA tenderness, guarding or rebound.  Musculoskeletal:        General: Swelling present.     Cervical back: No tenderness.     Right lower leg: Right lower leg edema: R BKA.     Left lower leg: Edema present.      Comments: Diffuse erythema, swelling, and disrupted skin and left lower leg.  Patient has dopplerable DP and PT pulse on left side.  Right side is a BKA.  Bandages were removed showing exposed wounds with warmth and redness.   Skin:    Capillary Refill: Capillary refill takes less than 2 seconds.     Findings: Erythema present.  Neurological:     General: No focal deficit present.     Mental Status: He is alert.     Sensory: Sensory deficit present.  Psychiatric:        Mood and Affect: Mood normal.    ED Results / Procedures / Treatments   Labs (all labs ordered are listed, but only abnormal results are displayed) Labs Reviewed  URINE CULTURE  CULTURE, BLOOD (ROUTINE X  2)  CULTURE, BLOOD (ROUTINE X 2)  RESP PANEL BY RT-PCR (FLU A&B, COVID) [id number]  CBC WITH DIFFERENTIAL/PLATELET  COMPREHENSIVE METABOLIC PANEL  PROTIME-INR  LACTIC ACID, PLASMA  LACTIC ACID, PLASMA  LIPASE, BLOOD  URINALYSIS, ROUTINE W REFLEX MICROSCOPIC  MAGNESIUM    EKG None  Radiology No results found.  Procedures Procedures  {Document cardiac monitor, telemetry assessment procedure when appropriate:1}  Medications Ordered in ED Medications  morphine (PF) 4 MG/ML injection 4 mg (has no administration in time range)    ED Course/ Medical Decision Making/ A&P                           Medical Decision Making Amount and/or Complexity of Data Reviewed Labs: ordered. Radiology: ordered.  Risk Prescription drug management.    Thomas [name] is a 71 y.o. 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  According to patient, he was discharged last week after admission for electrolyte  troubles, kidney troubles, and cellulitis.  He reports he has been taking all of his medications as directed but home health saw his left leg today and was concern for worsening cellulitis given the patient's chills, worsened left leg pain, redness, and drainage.  Patient says that he also has had abdominal pain, distention, nausea, and loud gurgling in his abdomen that he is concerned about.  This is gone on for the last 2 days but he denies history of bowel obstruction.  He reports he has not had a bowel movement for the last 2 days.  He denies any fevers but does have some chills.  Denies chest pain, shortness of breath or cough.  Reports he chronically has back pain that is unchanged from baseline.  He is concerned about pain in his left leg which he was told he has an acute on chronic DVT as well as a cellulitis.  He says that he has no sensation distally in his leg and that is unchanged from baseline.  Review of the patient's discharge summary reports he is high risk for admission as he was recommended go to skilled nursing facility but he did not want to do that at the time.  On exam, his leg bandages were removed and does show what appears to be cellulitis with erythema warmth, and some oozing and multiple ulcerative sites on the left lower leg.  A Doppler was used and we did find both DP and PT pulses.  He has no sensation distally but was able to move his foot.  Abdomen was distended and slightly tender to palpation.  Bowel sounds were very loud.  Chest nontender.  Neck nontender.  Back is tender to palpation which she reports is at baseline.  Lungs were clear.  Clinically I am concerned about worsening cellulitis in the leg.  I am also concerned about the abdominal pain, distention, nausea, and chills.  Will get CT scan to rule out bowel obstruction and will also get labs to assess his electrolyte abnormalities and concern for cellulitis worsened.  Will get x-ray to look for subcutaneous gas in the  leg.  Given his worsening symptoms, anticipate he will need readmission.  8:00 PM Work-up is started to return.  Lactic acid not  elevated.  Hemoglobin similar to prior but no leukocytosis present.  Metabolic panel does show recurrent acute kidney injury with a creatinine now 4.47 up from 3.0 6 days ago.  BUN also more elevated.  Lipase nonelevated.  Magnesium normal.  X-ray of the tib-fib did not show subcutaneous gas or osteomyelitis.  Chest x-ray shows concern for possible pneumonia which may explain his chills and fatigue.  CT scan does show some perivesicular stranding but we are still waiting urinalysis to see if this is from UTI.  He also has some right-sided hydronephrosis but no evidence of acute stone.  Unclear etiology with patient is not hurting on the right side of the abdomen at this time.  No evidence of obstruction however.  When patient urinalysis has returned, we will call for admission for worsening leg cellulitis, pneumonia, fatigue, AKI.  His potassium has improved from prior which is reassuring despite his kidney function worsening.  10:15 PM Work-up has returned and patient does have leukocytes and bacteria which given the imaging findings are concerning for UTI.  Patient also has the x-ray showing pneumonia.  BNP is slightly less elevated than prior and COVID and flu is negative.  Given the patient's worsening leg cellulitis, pneumonia, and UTI, I do feel he needs admission.  We will call for admission and antibiotics.   {Document critical care time when appropriate:1} {Document review of labs and clinical decision tools ie heart score, Chads2Vasc2 etc:1}  {Document your independent review of radiology images, and any outside records:1} {Document your discussion with family members, caretakers, and with consultants:1} {Document social determinants of health affecting pt's care:1} {Document your decision making why or why not admission, treatments were needed:1} Final  Clinical Impression(s) / ED Diagnoses Final diagnoses:  None    Rx / DC Orders ED Discharge Orders     None

## 2021-12-31 ENCOUNTER — Encounter (HOSPITAL_COMMUNITY): Payer: Self-pay | Admitting: Family Medicine

## 2021-12-31 DIAGNOSIS — J189 Pneumonia, unspecified organism: Secondary | ICD-10-CM | POA: Diagnosis not present

## 2021-12-31 DIAGNOSIS — R9431 Abnormal electrocardiogram [ECG] [EKG]: Secondary | ICD-10-CM

## 2021-12-31 DIAGNOSIS — Z7901 Long term (current) use of anticoagulants: Secondary | ICD-10-CM

## 2021-12-31 DIAGNOSIS — N390 Urinary tract infection, site not specified: Secondary | ICD-10-CM

## 2021-12-31 LAB — COMPREHENSIVE METABOLIC PANEL
ALT: 8 U/L (ref 0–44)
AST: 20 U/L (ref 15–41)
Albumin: 3.1 g/dL — ABNORMAL LOW (ref 3.5–5.0)
Alkaline Phosphatase: 75 U/L (ref 38–126)
Anion gap: 12 (ref 5–15)
BUN: 89 mg/dL — ABNORMAL HIGH (ref 8–23)
CO2: 20 mmol/L — ABNORMAL LOW (ref 22–32)
Calcium: 7.5 mg/dL — ABNORMAL LOW (ref 8.9–10.3)
Chloride: 107 mmol/L (ref 98–111)
Creatinine, Ser: 4.19 mg/dL — ABNORMAL HIGH (ref 0.61–1.24)
GFR, Estimated: 15 mL/min — ABNORMAL LOW (ref >60)
Glucose, Bld: 114 mg/dL — ABNORMAL HIGH (ref 70–99)
Potassium: 4.2 mmol/L (ref 3.5–5.1)
Sodium: 139 mmol/L (ref 135–145)
Total Bilirubin: 0.3 mg/dL (ref 0.3–1.2)
Total Protein: 7.8 g/dL (ref 6.5–8.1)

## 2021-12-31 LAB — CBC
HCT: 26.5 % — ABNORMAL LOW (ref 39.0–52.0)
Hemoglobin: 8.2 g/dL — ABNORMAL LOW (ref 13.0–17.0)
MCH: 31.7 pg (ref 26.0–34.0)
MCHC: 30.9 g/dL (ref 30.0–36.0)
MCV: 102.3 fL — ABNORMAL HIGH (ref 80.0–100.0)
Platelets: 157 10*3/uL (ref 150–400)
RBC: 2.59 MIL/uL — ABNORMAL LOW (ref 4.22–5.81)
RDW: 15.3 % (ref 11.5–15.5)
WBC: 6.2 10*3/uL (ref 4.0–10.5)
nRBC: 0 % (ref 0.0–0.2)

## 2021-12-31 LAB — GLUCOSE, CAPILLARY
Glucose-Capillary: 100 mg/dL — ABNORMAL HIGH (ref 70–99)
Glucose-Capillary: 116 mg/dL — ABNORMAL HIGH (ref 70–99)
Glucose-Capillary: 125 mg/dL — ABNORMAL HIGH (ref 70–99)
Glucose-Capillary: 158 mg/dL — ABNORMAL HIGH (ref 70–99)

## 2021-12-31 LAB — LACTIC ACID, PLASMA: Lactic Acid, Venous: 1 mmol/L (ref 0.5–1.9)

## 2021-12-31 LAB — PROTIME-INR
INR: 3.5 — ABNORMAL HIGH (ref 0.8–1.2)
Prothrombin Time: 35.1 seconds — ABNORMAL HIGH (ref 11.4–15.2)

## 2021-12-31 MED ORDER — OXYCODONE HCL 5 MG PO TABS
10.0000 mg | ORAL_TABLET | ORAL | Status: AC | PRN
  Administered 2021-12-31 (×3): 10 mg via ORAL
  Filled 2021-12-31 (×3): qty 2

## 2021-12-31 MED ORDER — GABAPENTIN 100 MG PO CAPS
100.0000 mg | ORAL_CAPSULE | Freq: Three times a day (TID) | ORAL | Status: DC
  Administered 2021-12-31 – 2022-01-03 (×11): 100 mg via ORAL
  Filled 2021-12-31 (×11): qty 1

## 2021-12-31 MED ORDER — SODIUM CHLORIDE 0.9 % IV SOLN
100.0000 mg | Freq: Two times a day (BID) | INTRAVENOUS | Status: DC
  Administered 2021-12-31 – 2022-01-02 (×4): 100 mg via INTRAVENOUS
  Filled 2021-12-31 (×5): qty 100

## 2021-12-31 MED ORDER — AMLODIPINE BESYLATE 5 MG PO TABS
5.0000 mg | ORAL_TABLET | Freq: Every morning | ORAL | Status: DC
  Administered 2021-12-31 – 2022-01-02 (×3): 5 mg via ORAL
  Filled 2021-12-31 (×3): qty 1

## 2021-12-31 MED ORDER — ALBUTEROL SULFATE (2.5 MG/3ML) 0.083% IN NEBU: 2.5000 mg | INHALATION_SOLUTION | RESPIRATORY_TRACT | Status: DC | PRN

## 2021-12-31 MED ORDER — TAMSULOSIN HCL 0.4 MG PO CAPS
0.4000 mg | ORAL_CAPSULE | Freq: Every day | ORAL | Status: DC
  Administered 2021-12-31 – 2022-01-03 (×4): 0.4 mg via ORAL
  Filled 2021-12-31 (×4): qty 1

## 2021-12-31 MED ORDER — LACTATED RINGERS IV SOLN: INTRAVENOUS | Status: DC

## 2021-12-31 MED ORDER — WARFARIN - PHARMACIST DOSING INPATIENT: Freq: Every day | Status: DC

## 2021-12-31 MED ORDER — ACETAMINOPHEN 325 MG PO TABS
650.0000 mg | ORAL_TABLET | Freq: Four times a day (QID) | ORAL | Status: DC | PRN
  Administered 2021-12-31 – 2022-01-01 (×3): 650 mg via ORAL
  Filled 2021-12-31 (×3): qty 2

## 2021-12-31 MED ORDER — LEVOTHYROXINE SODIUM 50 MCG PO TABS
50.0000 ug | ORAL_TABLET | Freq: Every day | ORAL | Status: DC
  Administered 2021-12-31 – 2022-01-03 (×4): 50 ug via ORAL
  Filled 2021-12-31 (×4): qty 1

## 2021-12-31 MED ORDER — SENNOSIDES-DOCUSATE SODIUM 8.6-50 MG PO TABS
2.0000 | ORAL_TABLET | Freq: Two times a day (BID) | ORAL | Status: DC
  Administered 2021-12-31 – 2022-01-03 (×6): 2 via ORAL
  Filled 2021-12-31 (×6): qty 2

## 2021-12-31 MED ORDER — FINASTERIDE 5 MG PO TABS
5.0000 mg | ORAL_TABLET | Freq: Every morning | ORAL | Status: DC
  Administered 2021-12-31 – 2022-01-03 (×4): 5 mg via ORAL
  Filled 2021-12-31 (×4): qty 1

## 2021-12-31 MED ORDER — SODIUM CHLORIDE 0.9 % IV SOLN
2.0000 g | INTRAVENOUS | Status: DC
  Administered 2021-12-31 – 2022-01-01 (×2): 2 g via INTRAVENOUS
  Filled 2021-12-31 (×3): qty 20

## 2021-12-31 MED ORDER — INSULIN ASPART 100 UNIT/ML IJ SOLN
0.0000 [IU] | Freq: Three times a day (TID) | INTRAMUSCULAR | Status: DC
  Administered 2021-12-31: 2 [IU] via SUBCUTANEOUS
  Administered 2021-12-31 – 2022-01-01 (×2): 3 [IU] via SUBCUTANEOUS
  Administered 2022-01-02 – 2022-01-03 (×3): 2 [IU] via SUBCUTANEOUS

## 2021-12-31 MED ORDER — BISACODYL 10 MG RE SUPP
10.0000 mg | Freq: Every day | RECTAL | Status: DC | PRN
  Administered 2021-12-31: 10 mg via RECTAL
  Filled 2021-12-31 (×2): qty 1

## 2021-12-31 MED ORDER — ATORVASTATIN CALCIUM 40 MG PO TABS
80.0000 mg | ORAL_TABLET | Freq: Every day | ORAL | Status: DC
Start: 2021-12-31 — End: 2022-01-03
  Administered 2021-12-31 – 2022-01-03 (×4): 80 mg via ORAL
  Filled 2021-12-31 (×4): qty 2

## 2021-12-31 MED ORDER — METOPROLOL TARTRATE 50 MG PO TABS
50.0000 mg | ORAL_TABLET | Freq: Two times a day (BID) | ORAL | Status: DC
  Administered 2021-12-31 – 2022-01-03 (×8): 50 mg via ORAL
  Filled 2021-12-31 (×8): qty 1

## 2021-12-31 MED ORDER — ACETAMINOPHEN 650 MG RE SUPP: 650.0000 mg | Freq: Four times a day (QID) | RECTAL | Status: DC | PRN

## 2021-12-31 NOTE — Assessment & Plan Note (Signed)
Monitor blood sugar. SSI as needed for glycemic control. Hold glipizide while in hospital to avoid hypoglycemia ?

## 2021-12-31 NOTE — Assessment & Plan Note (Signed)
Continue norvasc, metoprolol. Monitor BP ?

## 2021-12-31 NOTE — Progress Notes (Signed)
ANTICOAGULATION CONSULT NOTE - Initial Consult ? ?Pharmacy Consult for Coumadin ?Indication:  hx VTE ? ?Allergies  ?Allergen Reactions  ? Other Other (See Comments)  ?  Blood pressure issues ?Per [facility] hospital: Pt states he can only take these pain meds or else he gets very sick: Oxycontin,morphine,demerol, and dilaudid are the only pain meds pt states he can take.  ?  ? Oxymorphone Other (See Comments)  ?  Causes kidney problems  ? Aspirin Nausea And Vomiting  ?  Per [facility]  ? Beta Vulgaris Nausea And Vomiting  ?  Beets  ? Buspirone Nausea And Vomiting and Other (See Comments)  ?  Affected his head  ? Cabbage Nausea And Vomiting  ? Codeine Other (See Comments)  ?  Unknown reaction  ? Dolobid [Diflunisal] Other (See Comments)  ?  Unknown reaction  ? Fish Allergy Nausea And Vomiting  ? Fish-Derived Products Nausea And Vomiting  ? Methadone Other (See Comments)  ?  Made him loopy  ? Pentazocine Other (See Comments)  ?  Unknown reaction  ? Propoxyphene Nausea And Vomiting and Other (See Comments)  ?  Messed with his head  ? Shellfish Allergy Nausea And Vomiting  ? Sulfa Antibiotics Hives  ? Sulfasalazine Other (See Comments)  ?  Unknown reaction  ? Vistaril [Hydroxyzine] Nausea And Vomiting and Other (See Comments)  ?  Didn't help  ? Amoxicillin Rash  ?  Per [facility]  ? ? ?Patient Measurements: ?  ? ?Vital Signs: ?Temp: 98.2 ?F (36.8 ?C) (03/14 0032) ?Temp Source: Oral (03/14 0032) ?BP: 165/88 (03/14 0032) ?Pulse Rate: 73 (03/14 0032) ? ?Labs: ?Recent Labs  ?  12/30/21 ?1810  ?HGB 9.0*  ?HCT 29.2*  ?PLT 186  ?LABPROT 35.3*  ?INR 3.5*  ?CREATININE 4.47*  ? ? ?Estimated Creatinine Clearance: 22.4 mL/min (A) (by C-G formula based on SCr of 4.47 mg/dL (H)). ? ? ?Medical History: ?Past Medical History:  ?Diagnosis Date  ? Arthritis   ? BPH (benign prostatic hyperplasia) 11/24/2019  ? Chronic pain syndrome 11/24/2019  ? CKD (chronic kidney disease), stage IV ([place]) 11/24/2019  ? Diabetes mellitus without  complication ([place])   ? Gout   ? Hypertension   ? Muscle pain   ? Physical deconditioning 11/24/2019  ? Scars   ? Swelling   ? ? ?Medications:  ? ? ?Assessment: ?71 yo M on warfarin PTA for hx DVT/PE. ?INR 3.5 on admission- supra-therapeutic.  ?No active bleeding noted. CBC- Hg low at 9.0; pltc WNL.   ?He is on Rocephin for cellulits v. PNA v. UTI which could cause elevated INR.  ? ?Goal of Therapy:  ?INR 2-3 ?  ?Plan:  ?Daily PT/INR ?F/U home med warfarin regimen ?Re-dose warfarin once clinically appropriate ? ?Netta Cedars PharmD ?12/31/2021,3:15 AM ? ? ?

## 2021-12-31 NOTE — Assessment & Plan Note (Signed)
Continue coumadin. Anticoagulated due to hx of DVTs and PE. INR supratherapeutic at 3.5. Pharmacy to dose and monitor coumadin ?

## 2021-12-31 NOTE — Assessment & Plan Note (Signed)
Antibiotics as above ?Wound care consult in am ?

## 2021-12-31 NOTE — Evaluation (Signed)
Occupational Therapy Evaluation ?Patient Details ?Name: Thomas [name] ?MRN: [id number] ?DOB: 28-Jul-1951 ?Today's Date: 12/31/2021 ? ? ?History of Present Illness Patient is a 71 year old male who presented to the hospital with fatigue, pain and edema in LLE. recent d/c on 12/24/21 with worsening LLE cellulitis and LLE DVT. PMH: chronic pain, back surgeries, DVT, PE, DM, CKD, R BKA, HR, gout, hyperhalemia, AKI, chronic hydronephrosis.  ? ?Clinical Impression ?  ?Patient is a 71 year old male who had multiple recent admissions. Patient was noted to have continued decreased activity tolerance, decreased safety awareness, decreased endurance, decreased standing balance, and  increased pain with movement. Patient would continue to benefit from 24/7 caregiver support in next level of care. Patient would continue to benefit from skilled OT services at this time while admitted and after d/c to address noted deficits in order to improve overall safety and independence in ADLs.  ? ?   ? ?Recommendations for follow up therapy are one component of a multi-disciplinary discharge planning process, led by the attending physician.  Recommendations may be updated based on patient status, additional functional criteria and insurance authorization.  ? ?Follow Up Recommendations ? Skilled nursing-short term rehab (<3 hours/day)  ?  ?Assistance Recommended at Discharge Frequent or constant Supervision/Assistance  ?Patient can return home with the following A lot of help with walking and/or transfers;A lot of help with bathing/dressing/bathroom;Assistance with cooking/housework;Direct supervision/assist for financial management;Assist for transportation;Help with stairs or ramp for entrance;Direct supervision/assist for medications management ? ?  ?Functional Status Assessment ?    ?Equipment Recommendations ? None recommended by OT  ?  ?Recommendations for Other Services   ? ? ?  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 ?  ? ?Mobility Bed Mobility ?Overal bed mobility: Modified Independent ?  ?  ?  ?Supine to sit: Modified independent (Device/Increase time) ?Sit to supine: Modified independent (Device/Increase time) ?  ?General bed mobility comments: with increased time ?  ? ?Transfers ?  ?  ?  ?  ?  ?  ?  ?  ?  ?  ?  ? ?  ?Balance Overall balance assessment: Needs assistance ?Sitting-balance support: Feet supported, No upper extremity supported ?Sitting balance-[name]: Good ?  ?  ?  ?  ?  ?  ?  ?  ?  ?  ?  ?  ?  ?  ?  ?  ?   ? ?ADL either performed or assessed with clinical judgement  ? ?ADL Overall ADL's : Needs assistance/impaired ?Eating/Feeding: Set up;Sitting ?  ?Grooming: Sitting;Set up ?  ?Upper Body Bathing: Set up;Sitting ?  ?Lower Body Bathing: Sit to/from stand;Minimal assistance;+2 for physical assistance;+2 for safety/equipment ?  ?Upper Body Dressing : Set up;Sitting ?  ?Lower Body Dressing: Sitting/lateral leans;Minimal assistance ?Lower Body Dressing Details (indicate cue type and reason): patient declined to stand at this time ?  ?Toilet Transfer Details (indicate cue type and reason): patient was able to scoot up from the edge of bed to head of bed with min guard. patient decliend to get into recliner at this time. ?Toileting- Clothing Manipulation and Hygiene: Sitting/lateral lean;Min guard ?  ?  ?  ?  ?   ? ? ? ?Vision Baseline Vision/History: 1 Wears glasses ?Patient Visual Report: No change from baseline ?   ?   ?Perception   ?  ?Praxis   ?  ? ?  Pertinent Vitals/Pain Pain Assessment ?Pain Assessment: Faces ?Faces Pain Scale: Hurts a little bit ?Pain Location: Back from neck down ?Pain Descriptors / Indicators: Discomfort ?Pain Intervention(s): Limited activity within patient's tolerance, Monitored during session  ? ? ? ?Hand Dominance Right ?  ?Extremity/Trunk  Assessment Upper Extremity Assessment ?Upper Extremity Assessment: Overall WFL for tasks assessed ?  ?Lower Extremity Assessment ?Lower Extremity Assessment: Defer to PT evaluation ?  ?Cervical / Trunk Assessment ?Cervical / Trunk Assessment: Normal ?  ?Communication Communication ?Communication: No difficulties ?  ?Cognition Arousal/Alertness: Awake/alert ?Behavior During Therapy: [facility] for tasks assessed/performed ?Overall Cognitive Status: Within Functional Limits for tasks assessed ?  ?  ?  ?  ?  ?  ?  ?  ?  ?  ?  ?  ?  ?  ?  ?  ?General Comments: patient continues to have poor insight to deficits with patient noted to scratch scab off leg despite education provided on blood thinners. patient reported that he does not like scabs ?  ?  ?General Comments    ? ?  ?Exercises   ?  ?Shoulder Instructions    ? ? ?Home Living Family/patient expects to be discharged to:: Private residence ?Living Arrangements: Alone ?Available Help at Discharge: Available PRN/intermittently ?Type of Home: House ?Home Access: Stairs to enter ?Entrance Stairs-Number of Steps: 1 ?Entrance Stairs-Rails: None ?Home Layout: One level ?  ?  ?Bathroom Shower/Tub: Tub/shower unit ?  ?Bathroom Toilet: Standard ?Bathroom Accessibility: Yes ?  ?Home Equipment: Conservation officer, nature (2 wheels);Cane - single point;Crutches ?  ?  ?  ? ?  ?Prior Functioning/Environment Prior Level of Function : Needs assist ?  ?  ?  ?  ?  ?  ?  ?ADLs Comments: patient reported being independent in ADLs. patient reported preferring sink bathing to showers. patient reported using RW at home for functional mobility. patient reported sleeping in recliner chair at home ?  ? ?  ?  ?OT Problem List: Decreased activity tolerance;Impaired balance (sitting and/or standing);Decreased safety awareness;Decreased knowledge of precautions;Decreased knowledge of use of DME or AE ?  ?   ?OT Treatment/Interventions: Self-care/ADL training;Therapeutic exercise;Neuromuscular education;Energy  conservation;DME and/or AE instruction;Therapeutic activities;Balance training;Patient/family education  ?  ?OT Goals(Current goals can be found in the care plan section) Acute Rehab OT Goals ?Patient Stated Goal: to go back home ?OT Goal Formulation: With patient ?Time For Goal Achievement: 01/14/22 ?Potential to Achieve Goals: Good  ?OT Frequency: Min 2X/week ?  ? ?Co-evaluation   ?  ?  ?  ?  ? ?  ?AM-PAC OT "6 Clicks" Daily Activity     ?Outcome Measure Help from another person eating meals?: None ?Help from another person taking care of personal grooming?: None ?Help from another person toileting, which includes using toliet, bedpan, or urinal?: A Little ?Help from another person bathing (including washing, rinsing, drying)?: A Little ?Help from another person to put on and taking off regular upper body clothing?: None ?Help from another person to put on and taking off regular lower body clothing?: A Little ?6 Click Score: 21 ?  ?End of Session Nurse Communication: Mobility status;Other (comment) (patient scratching at preexisting areas) ? ?Activity Tolerance: Patient tolerated treatment well ?Patient left: in bed;with call bell/phone within reach ? ?OT Visit Diagnosis: Unsteadiness on feet (R26.81);Pain  ?              ?Time: 6301-6010 ?OT Time Calculation (min): 18 min ?Charges:  OT General Charges ?$OT Visit: 1 Visit ?OT Evaluation ?$OT  Eval Moderate Complexity: 1 Mod ? ?Spyridon Hornstein OTR/L, MS ?Acute Rehabilitation Department ?Office# [phone] ?Pager# [phone] ? ? ?[place] ?12/31/2021, 2:10 PM ?

## 2021-12-31 NOTE — Progress Notes (Addendum)
Thomas [name] is a 71 y.o. 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  ?Has had recent admissions for DM TY 2 complicated by multiple admissions 12/29 through 1/3 -12/19 through 12/26-severe hypoglycemia causing encephalopathy and hospitalization; hospitalization 2/18-2/26 lower extremity cellulitis, Eliquis failure with acute on chronic DVT and transition to Coumadin-hospitalization complicated by SVT and progression of kidney disease; Readmit 12/19/2021 malaise worsening tremor-severe hyperkalemia 7.2 peaked T waves admitted to ICU and Nephrology consulted-started Lokelma, Lasix. ?  ?He had dressings placed on his legs before he was discharged in the hospital and states that wound care supposed to come to his house but have not since his discharge.  Dressings were removed and when he did a small piece of skin tore off and bled briefly which is now stopped.  Leg is very red, tender to palpation and indurated.  There is dry cracking skin.  Leg looks very cellulitic.  Patient was found to have a pneumonia on chest x-ray as well as UTI.  He is complaining of lower abdominal pain in the last few days and urinary frequency but no dysuria.  He has not had any vomiting but he has felt nauseous intermittently he states.  Work-up revealed multifocal pneumonia, left leg cellulitis, presumptive UTI, started on Rocephin and doxycycline for the latter.  ? ?12/31/2021: Patient seen and examined at his bedside.  He requested double portions of this meals and wants to be on a regular diet.  Endorses his cough is improved.  He has no new complaints. ? ?Please refer to H&P dictated by my partner  Dr. Tonie Griffith on 12/31/2021 for further details of the assessment and plan. ? ?

## 2021-12-31 NOTE — Plan of Care (Signed)
?  Problem: Clinical Measurements: ?Goal: Respiratory complications will improve ?Outcome: Progressing ?Goal: Cardiovascular complication will be avoided ?Outcome: Progressing ?  ?Problem: Nutrition: ?Goal: Adequate nutrition will be maintained ?Outcome: Progressing ?  ?Problem: Safety: ?Goal: Ability to remain free from injury will improve ?Outcome: Progressing ?  ?

## 2021-12-31 NOTE — Progress Notes (Signed)
ANTICOAGULATION CONSULT NOTE - follow up ? ?Pharmacy Consult for Coumadin ?Indication:  hx VTE ? ?Allergies  ?Allergen Reactions  ? Other Other (See Comments)  ?  Blood pressure issues ?Per [facility] hospital: Pt states he can only take these pain meds or else he gets very sick: Oxycontin,morphine,demerol, and dilaudid are the only pain meds pt states he can take.  ?  ? Oxymorphone Other (See Comments)  ?  Causes kidney problems  ? Aspirin Nausea And Vomiting  ?  Per [facility], pt does not know reaction   ? Beta Vulgaris Nausea And Vomiting  ? Buspirone Other (See Comments)  ?  Affected his head ?  ? Cabbage Nausea And Vomiting  ? Codeine Other (See Comments)  ?  Pt does not remember the reaction but knows he can not take ?  ? Diflunisal Nausea And Vomiting and Other (See Comments)  ? Fish Allergy Nausea And Vomiting  ? Fish-Derived Products Nausea And Vomiting  ? Hydroxyzine Nausea And Vomiting  ? Methadone Other (See Comments)  ?  Made him loopy  ? Pentazocine Other (See Comments)  ?  Pt does not remember the reaction   ? Propoxyphene Other (See Comments)  ?  Pt does not remember the reaction ? ?  ? Shellfish Allergy Nausea And Vomiting  ? Sulfa Antibiotics Hives  ? Sulfasalazine Other (See Comments)  ?  Pt does not remember the reaction  ? Amoxicillin Nausea And Vomiting and Rash  ?  Per [facility]  ? ? ?Patient Measurements: ?Height: 6\' 5"  (195.6 cm) ?Weight: 120.2 kg (265 lb) ?IBW/kg (Calculated) : 89.1 ? ?Vital Signs: ?Temp: 98 ?F (36.7 ?C) (03/14 1287) ?Temp Source: Oral (03/14 8676) ?BP: 140/73 (03/14 7209) ?Pulse Rate: 65 (03/14 0626) ? ?Labs: ?Recent Labs  ?  12/30/21 ?1810 12/31/21 ?0328 12/31/21 ?4709  ?HGB 9.0* 8.2*  --   ?HCT 29.2* 26.5*  --   ?PLT 186 157  --   ?LABPROT 35.3*  --  35.1*  ?INR 3.5*  --  3.5*  ?CREATININE 4.47* 4.19*  --   ? ? ? ?Estimated Creatinine Clearance: 24.2 mL/min (A) (by C-G formula based on SCr of 4.19 mg/dL (H)). ? ? ?Medical History: ?Past Medical History:   ?Diagnosis Date  ? Arthritis   ? BPH (benign prostatic hyperplasia) 11/24/2019  ? Chronic pain syndrome 11/24/2019  ? CKD (chronic kidney disease), stage IV ([place]) 11/24/2019  ? Diabetes mellitus without complication ([place])   ? Gout   ? Hypertension   ? Muscle pain   ? Physical deconditioning 11/24/2019  ? Scars   ? Swelling   ? ? ?Home warfarin dose per med rec ?Warfarin 7.5mg  daiy, last dose past week ? ? ?Assessment: ?71 yo M on warfarin PTA for hx DVT/PE. ?INR still supra-therapeutic.  ?No active bleeding noted. CBC- Hg low at 9.0; pltc WNL.   ?He is on Rocephin for cellulits v. PNA v. UTI which could cause elevated INR.  ? ?Goal of Therapy:  ?INR 2-3 ?  ?Plan:  ?Daily PT/INR ?Continue to hold warfarin for today ? ? ?Adrian Saran, PharmD, BCPS ?Secure Chat if ?s ?12/31/2021 1:43 PM ? ? ?

## 2021-12-31 NOTE — Assessment & Plan Note (Signed)
stable °

## 2021-12-31 NOTE — H&P (Signed)
?History and Physical  ? ? ?Patient: Thomas [name] ZOX:[id number] DOB: Oct 27, 1950 ?DOA: 12/30/2021 ?DOS: the patient was seen and examined on 12/31/2021 ?PCP: Antonietta Jewel, MD  ?Patient coming from: Home ? ?Chief Complaint:  ?Chief Complaint  ?Patient presents with  ? Leg Swelling  ? ?HPI: Thomas [name] is a 71 y.o. 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  ?Has had recent admissions for DM TY 2 complicated by multiple admissions 12/29 through 1/3 -12/19 through 12/26-severe hypoglycemia causing encephalopathy and hospitalization; hospitalization 2/18-2/26 lower extremity cellulitis, Eliquis failure with acute on chronic DVT and transition to Coumadin-hospitalization complicated by SVT and progression of kidney disease; Readmit 12/19/2021 malaise worsening tremor-severe hyperkalemia 7.2 peaked T waves admitted to ICU and Nephrology consulted-started Lokelma, Lasix. ? ?He had dressings placed on his legs before he was discharged in the hospital and states that wound care supposed to come to his house but have not since his discharge.  Dressings were removed and when he did a small piece of skin tore off and bled briefly which is now stopped.  Leg is very red, tender to palpation and indurated.  There is dry cracking skin.  Leg looks very cellulitic.  Patient was found to have a pneumonia on chest x-ray as well as UTI.  He is complaining of lower abdominal pain in the last few days and urinary frequency but no dysuria.  He has not had any vomiting but he has felt nauseous intermittently he states.  ?Hospital service asked to admit for further management ? ?Review of Systems: As  mentioned in the history of present illness. All other systems reviewed and are negative. ?Past Medical History:  ?Diagnosis Date  ? Arthritis   ? BPH (benign prostatic hyperplasia) 11/24/2019  ? Chronic pain syndrome 11/24/2019  ? CKD (chronic kidney disease), stage IV ([place]) 11/24/2019  ? Diabetes mellitus without complication ([place])   ? Gout   ? Hypertension   ? Muscle pain   ? Physical deconditioning 11/24/2019  ? Scars   ? Swelling   ? ?Past Surgical History:  ?Procedure Laterality Date  ? BELOW KNEE LEG AMPUTATION Right   ? ESOPHAGOGASTRODUODENOSCOPY (EGD) WITH PROPOFOL N/A 11/25/2019  ? Procedure: ESOPHAGOGASTRODUODENOSCOPY (EGD) WITH PROPOFOL;  Surgeon: Carol Ada, MD;  Location: [place];  Service: Endoscopy;  Laterality: N/A;  ? IR FLUORO GUIDE CV LINE RIGHT  09/05/2019  ? IR LUMBAR DISC ASPIRATION W/IMG GUIDE  11/26/2019  ? IR US GUIDE VASC ACCESS RIGHT  09/05/2019  ? RADIOLOGY WITH ANESTHESIA N/A 11/25/2019  ? Procedure: Thoracic and Lumbar MRI WITH ANESTHESIA;  Surgeon: Radiologist, Medication, MD;  Location: [place];  Service: Radiology;  Laterality: N/A;  ? ?Social History:  reports that he has never smoked. He has never been exposed to tobacco smoke. He has never used smokeless tobacco. He reports that he does not drink alcohol and does not use drugs. ? ?Allergies  ?Allergen Reactions  ? Other Other (See Comments)  ?  Blood pressure issues ?Per [facility] hospital: Pt states he can only take these pain meds or else he gets very sick: Oxycontin,morphine,demerol, and dilaudid are the only pain meds pt states he can  take.  ?  ? Oxymorphone Other (See Comments)  ?  Causes kidney problems  ? Aspirin Nausea And Vomiting  ?  Per [facility]  ? Beta Vulgaris Nausea And Vomiting  ?  Beets  ? Buspirone Nausea And Vomiting and Other (See Comments)  ?  Affected his head  ? Cabbage Nausea And Vomiting  ? Codeine Other (See Comments)  ?  Unknown reaction  ? Dolobid [Diflunisal] Other (See Comments)  ?  Unknown reaction   ? Fish Allergy Nausea And Vomiting  ? Fish-Derived Products Nausea And Vomiting  ? Methadone Other (See Comments)  ?  Made him loopy  ? Pentazocine Other (See Comments)  ?  Unknown reaction  ? Propoxyphene Nausea And Vomiting and Other (See Comments)  ?  Messed with his head  ? Shellfish Allergy Nausea And Vomiting  ? Sulfa Antibiotics Hives  ? Sulfasalazine Other (See Comments)  ?  Unknown reaction  ? Vistaril [Hydroxyzine] Nausea And Vomiting and Other (See Comments)  ?  Didn't help  ? Amoxicillin Rash  ?  Per [facility]  ? ? ?Family History  ?Problem Relation Age of Onset  ? Heart disease Mother   ? Cancer Father   ? ? ?Prior to Admission medications   ?Medication Sig Start Date End Date Taking? Authorizing Provider  ?amLODipine (NORVASC) 5 MG tablet Take 5 mg by mouth every morning. 11/26/21   [provider]  ?atorvastatin (LIPITOR) 80 MG tablet Take 1 tablet (80 mg total) by mouth daily. 09/25/20   Jose Persia, MD  ?bisacodyl (DULCOLAX) 10 MG suppository Place 10 mg rectally daily.    [provider]  ?ferrous sulfate 325 (65 FE) MG tablet Take 1 tablet (325 mg total) by mouth daily with breakfast. 12/15/21 01/14/22  Antonieta Pert, MD  ?finasteride (PROSCAR) 5 MG tablet Take 1 tablet (5 mg total) by mouth daily. ?Patient taking differently: Take 5 mg by mouth every morning. 10/10/19   Hosie Poisson, MD  ?gabapentin (NEURONTIN) 100 MG capsule Take 1 capsule (100 mg total) by mouth 3 (three) times daily. 12/24/21   Nita Sells, MD  ?glipiZIDE (GLUCOTROL XL) 10 MG 24 hr tablet Take 10 mg by mouth daily as needed (CBG >120).    [provider]  ?lactulose (CHRONULAC) 10 GM/15ML solution Take by mouth 2 (two) times daily as needed (constipation).    [provider]  ?levothyroxine (SYNTHROID) 50 MCG tablet Take 50 mcg by mouth daily before breakfast.     [provider]  ?metoprolol tartrate (LOPRESSOR) 50 MG tablet Take 1 tablet (50 mg total) by mouth 2 (two)  times daily. 12/24/21   Nita Sells, MD  ?naloxegol oxalate (MOVANTIK) 25 MG TABS tablet Take 25 mg by mouth every morning.    [provider]  ?oxyCODONE (OXY IR/ROXICODONE) 5 MG immediate release tablet Take 1-2 tablets (5-10 mg total) by mouth every 4 (four) hours as needed for severe pain. 12/24/21   Nita Sells, MD  ?pantoprazole (PROTONIX) 40 MG tablet Take 1 tablet by mouth 2 times daily. ?Patient not taking: Reported on 12/07/2021 10/01/21 10/31/21  Kayleen Memos, DO  ?senna-docusate (SENOKOT-S) 8.6-50 MG tablet Take 1 tablet by mouth 2 (two) times daily for 7 days. 12/24/21 12/31/21  Nita Sells, MD  ?sodium zirconium cyclosilicate (LOKELMA) 10 g PACK packet Take 10 g by mouth daily. 12/24/21   Nita Sells, MD  ?tamsulosin (FLOMAX) 0.4 MG CAPS capsule Take 1 capsule (0.4 mg total) by mouth daily. 09/25/20  Jose Persia, MD  ?tiZANidine (ZANAFLEX) 4 MG tablet Take 4 mg by mouth every 8 (eight) hours as needed for muscle spasms. 11/26/21   [provider]  ?warfarin (COUMADIN) 2.5 MG tablet Take 3 tablets (7.5 mg total) by mouth one time only at 4 PM. INR check in 3 days and adjust coumadin dose for INR of 2-3 ?Patient not taking: Reported on 12/19/2021 12/14/21 01/13/22  Antonieta Pert, MD  ? ? ?Physical Exam: ?Vitals:  ? 12/30/21 1930 12/30/21 2110 12/30/21 2200 12/30/21 2330  ?BP: (!) 146/82 (!) 146/77 (!) 157/85 (!) 156/91  ?Pulse: 65 68 63 63  ?Resp: 18 18 18    ?Temp:      ?TempSrc:      ?SpO2: 97% 96% 99% 95%  ? ?General: WDWN, Alert and oriented x3. Tired appearing ?Eyes: EOMI, PERRL, conjunctivae normal.  Sclera nonicteric ?HENT:  [place]/AT, external ears normal.  Nares patent without epistasis.  Mucous membranes are moist. ?Neck: Soft, normal range of motion, supple, no masses, no thyromegaly.  Trachea midline ?Respiratory: Equal but diminished breath sounds.  No wheezing, no crackles. Normal respiratory effort. ?Cardiovascular: Regular rate and rhythm, no murmurs /  rubs / gallops. Has left lower extremity edema.  ?Abdomen: Soft, diffuse tenderness, nondistended, no rebound or guarding.  No masses palpated. Obese. Bowel sounds normoactive ?Musculoskeletal: FROM. R

## 2021-12-31 NOTE — Assessment & Plan Note (Signed)
Avoid medications which can further prolong QT interval ?

## 2021-12-31 NOTE — Assessment & Plan Note (Signed)
Continue home medications.  ?Monitor fluid volume status ?

## 2021-12-31 NOTE — Consult Note (Signed)
WOC Nurse Consult Note: ?Patient receiving care in [place]. Primary RN present at time of my visit. ?Reason for Consult: LLE wound, non-healing ?Wound type: scattered, superficial, weeping, crusting areas on LLE ?Pressure Injury POA: Yes/No/NA ?Measurement: na ?Wound bed: dried crusts over entire LLE ?Drainage (amount, consistency, odor) serosanginous ?Periwound: changes associated with venous stasis--edema, erythema, hemosiderin staining ?Dressing procedure/placement/frequency: ? ?Today I washed his LLE with soap and water and applied a heavy layer of [employer] Clear to the entire LE. Two ABD pads placed over the crusted areas, and kerlix spiral wrapped from behind the toes to below the knees, then secured with a lightly wrapped Ace bandage.  ? ?The patient informed me that any type of snug wrap to the leg causes him extreme pain.  Also, in November of 2022 there were findings of a DVT to the left femoral  vein.  In February of this year his left ABI was greater than 1.0.  It might be helpful to have the vessels of the LLE evaluated again. ? ?In light of conservative treatment, I have implemented the approach I provided today. ? ?Thank you for the consult.  Discussed plan of care with the patient and bedside nurse.  [place] nurse will not follow at this time.  Please re-consult the [place] team if needed. ? ?Val Riles, RN, MSN, CWOCN, CNS-BC, pager [phone]  ?

## 2021-12-31 NOTE — Assessment & Plan Note (Signed)
Gentle IVF hydration. Monitor electrolytes and renal function ?

## 2021-12-31 NOTE — Evaluation (Signed)
Physical Therapy Evaluation ?Patient Details ?Name: Thomas [name] ?MRN: [id number] ?DOB: [birth year] ?Today's Date: 12/31/2021 ? ?History of Present Illness ? Patient is a 71 year old male who presented to the hospital with fatigue, pain and edema in LLE. recent d/c on 12/24/21 with worsening LLE cellulitis and LLE DVT. PMH: chronic pain, back surgeries, DVT, PE, DM, CKD, R BKA, HR, gout, hyperhalemia, AKI, chronic hydronephrosis.  ?Clinical Impression ? On eval, pt was Min guard assist for mobility. He was able to don his prosthesis independently. He walked ~60 feet around the room with a RW. Tolerated activity well. Will plan to follow during hospital stay. Recommend HHPT f/u if pt is agreeable.    ?   ? ?Recommendations for follow up therapy are one component of a multi-disciplinary discharge planning process, led by the attending physician.  Recommendations may be updated based on patient status, additional functional criteria and insurance authorization. ? ?Follow Up Recommendations Home health PT ? ?  ?Assistance Recommended at Discharge Intermittent Supervision/Assistance  ?Patient can return home with the following ? A little help with walking and/or transfers;A little help with bathing/dressing/bathroom;Assist for transportation;Help with stairs or ramp for entrance ? ?  ?Equipment Recommendations None recommended by PT  ?Recommendations for Other Services ?    ?  ?Functional Status Assessment Patient has had a recent decline in their functional status and demonstrates the ability to make significant improvements in function in a reasonable and predictable amount of time.  ? ?  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 Also wears boot on L foot (does not have in hospital room) ?Restrictions ?Weight Bearing Restrictions: No  ? ?  ? ?Mobility ? Bed Mobility ?Overal bed mobility: Modified Independent ?  ?  ?  ?  ?  ?  ?  ?  ? ?Transfers ?Overall transfer  level: Needs assistance ?Equipment used: Rolling walker (2 wheels) ?Transfers: Sit to/from Stand ?Sit to Stand: Min guard ?  ?  ?  ?  ?  ?General transfer comment: Min guard for safety. Highly elevated bed. ?  ? ?Ambulation/Gait ?Ambulation/Gait assistance: Min guard ?Gait Distance (Feet): 60 Feet ?Assistive device: Rolling walker (2 wheels) ?Gait Pattern/deviations: Step-through pattern, Decreased stride length, Trunk flexed ?  ?  ?  ?General Gait Details: Min guard for safety. No LOB with RW use. Tolerated distance well ? ?Stairs ?  ?  ?  ?  ?  ? ?Wheelchair Mobility ?  ? ?Modified Rankin (Stroke Patients Only) ?  ? ?  ? ?Balance Overall balance assessment: Needs assistance ?  ?  ?  ?  ?Standing balance support: During functional activity, Single extremity supported, Reliant on assistive device for balance ?  ?  ?  ?  ?  ?  ?  ?  ?  ?  ?  ?  ?  ?   ? ? ? ?Pertinent Vitals/Pain Pain Assessment ?Pain Assessment: Faces ?Faces Pain Scale: Hurts little more ?Pain Location: neck, back ?Pain Descriptors / Indicators: Discomfort, Sore ?Pain Intervention(s): Patient requesting pain meds-RN notified, Repositioned  ? ? ?Home Living Family/patient expects to be discharged to:: Private residence ?Living Arrangements: Alone ?Available Help at Discharge: Available PRN/intermittently ?Type of Home: House ?Home Access: Stairs to enter ?Entrance Stairs-Rails: None ?Entrance Stairs-Number of Steps: 1 ?  ?Home Layout: One level ?Home Equipment: Conservation officer, nature (2 wheels);Cane - single point;Crutches ?   ?  ?Prior Function Prior Level of Function : Needs assist ?  ?  ?  ?  ?  ?  ?  ?  ADLs Comments: patient reported being independent in ADLs. patient reported preferring sink bathing to showers. patient reported using RW at home for functional mobility. patient reported sleeping in recliner chair at home ?  ? ? ?Hand Dominance  ? Dominant Hand: Right ? ?  ?Extremity/Trunk Assessment  ? Upper Extremity Assessment ?Upper Extremity  Assessment: Defer to OT evaluation ?  ? ?Lower Extremity Assessment ?Lower Extremity Assessment: Generalized weakness ?  ? ?Cervical / Trunk Assessment ?Cervical / Trunk Assessment: Normal  ?Communication  ? Communication: No difficulties  ?Cognition Arousal/Alertness: Awake/alert ?Behavior During Therapy: [facility] for tasks assessed/performed ?  ?  ?  ?  ?  ?  ?  ?  ?  ?  ?  ?  ?  ?  ?  ?  ?  ?  ?  ?  ? ?  ?General Comments   ? ?  ?Exercises    ? ?Assessment/Plan  ?  ?PT Assessment Patient needs continued PT services  ?PT Problem List Decreased strength;Decreased mobility;Decreased activity tolerance;Decreased balance;Decreased knowledge of use of DME;Pain;Obesity ? ?   ?  ?PT Treatment Interventions DME instruction;Gait training;Therapeutic exercise;Balance training;Therapeutic activities;Patient/family education;Functional mobility training   ? ?PT Goals (Current goals can be found in the Care Plan section)  ?Acute Rehab PT Goals ?Patient Stated Goal: go home, walk around house ?PT Goal Formulation: With patient ?Time For Goal Achievement: 01/14/22 ?Potential to Achieve Goals: Fair ? ?  ?Frequency Min 3X/week ?  ? ? ?Co-evaluation   ?  ?  ?  ?  ? ? ?  ?AM-PAC PT "6 Clicks" Mobility  ?Outcome Measure Help needed turning from your back to your side while in a flat bed without using bedrails?: None ?Help needed moving from lying on your back to sitting on the side of a flat bed without using bedrails?: None ?Help needed moving to and from a bed to a chair (including a wheelchair)?: A Little ?Help needed standing up from a chair using your arms (e.g., wheelchair or bedside chair)?: A Little ?Help needed to walk in hospital room?: A Little ?Help needed climbing 3-5 steps with a railing? : A Lot ?6 Click Score: 19 ? ?  ?End of Session Equipment Utilized During Treatment: Gait belt ?Activity Tolerance: Patient tolerated treatment well ?Patient left: in bed;with call bell/phone within reach;with bed alarm set ?  ?PT Visit  Diagnosis: Muscle weakness (generalized) (M62.81);Difficulty in walking, not elsewhere classified (R26.2) ?  ? ?Time: 1448-1856 ?PT Time Calculation (min) (ACUTE ONLY): 45 min ? ? ?Charges:   PT Evaluation ?$PT Eval Moderate Complexity: 1 Mod ?PT Treatments ?$Gait Training: 23-37 mins ?  ?   ? ? ? ? ? ?Doreatha Massed, PT ?Acute Rehabilitation  ?Office: [phone] ?Pager: [phone] ? ?  ? ?

## 2021-12-31 NOTE — Assessment & Plan Note (Addendum)
Thomas [name] is admitted to Telemetry floor.  Started on Rocephin and doxycycline for pneumonia. Rocephin will also cover UTI and skin.  Antibiotic selection was discussed with pharmacy  Check CBC, CMP in am Gentle IVF hydration.  Incentive spirometer every 2 hours while awake.  Albuterol MDI as needed Supplemental oxygen as needed to keep O2 sat 92-96%

## 2021-12-31 NOTE — Assessment & Plan Note (Signed)
Rocephin as above ?Urine culture obtained in ER and will be monitored ?

## 2022-01-01 DIAGNOSIS — J189 Pneumonia, unspecified organism: Secondary | ICD-10-CM | POA: Diagnosis not present

## 2022-01-01 LAB — MAGNESIUM: Magnesium: 1.9 mg/dL (ref 1.7–2.4)

## 2022-01-01 LAB — COMPREHENSIVE METABOLIC PANEL
ALT: 10 U/L (ref 0–44)
AST: 19 U/L (ref 15–41)
Albumin: 3.2 g/dL — ABNORMAL LOW (ref 3.5–5.0)
Alkaline Phosphatase: 73 U/L (ref 38–126)
Anion gap: 10 (ref 5–15)
BUN: 70 mg/dL — ABNORMAL HIGH (ref 8–23)
CO2: 21 mmol/L — ABNORMAL LOW (ref 22–32)
Calcium: 7.7 mg/dL — ABNORMAL LOW (ref 8.9–10.3)
Chloride: 110 mmol/L (ref 98–111)
Creatinine, Ser: 3.19 mg/dL — ABNORMAL HIGH (ref 0.61–1.24)
GFR, Estimated: 20 mL/min — ABNORMAL LOW (ref >60)
Glucose, Bld: 96 mg/dL (ref 70–99)
Potassium: 4.3 mmol/L (ref 3.5–5.1)
Sodium: 141 mmol/L (ref 135–145)
Total Bilirubin: 0.1 mg/dL — ABNORMAL LOW (ref 0.3–1.2)
Total Protein: 7.7 g/dL (ref 6.5–8.1)

## 2022-01-01 LAB — CBC
HCT: 26.7 % — ABNORMAL LOW (ref 39.0–52.0)
Hemoglobin: 8.5 g/dL — ABNORMAL LOW (ref 13.0–17.0)
MCH: 32 pg (ref 26.0–34.0)
MCHC: 31.8 g/dL (ref 30.0–36.0)
MCV: 100.4 fL — ABNORMAL HIGH (ref 80.0–100.0)
Platelets: 161 10*3/uL (ref 150–400)
RBC: 2.66 MIL/uL — ABNORMAL LOW (ref 4.22–5.81)
RDW: 15 % (ref 11.5–15.5)
WBC: 5.7 10*3/uL (ref 4.0–10.5)
nRBC: 0 % (ref 0.0–0.2)

## 2022-01-01 LAB — GLUCOSE, CAPILLARY
Glucose-Capillary: 96 mg/dL (ref 70–99)
Glucose-Capillary: 96 mg/dL (ref 70–99)
Glucose-Capillary: 114 mg/dL — ABNORMAL HIGH (ref 70–99)
Glucose-Capillary: 169 mg/dL — ABNORMAL HIGH (ref 70–99)

## 2022-01-01 LAB — PHOSPHORUS: Phosphorus: 5.2 mg/dL — ABNORMAL HIGH (ref 2.5–4.6)

## 2022-01-01 LAB — PROCALCITONIN: Procalcitonin: <0.1 ng/mL

## 2022-01-01 LAB — URINE CULTURE: Culture: NO GROWTH

## 2022-01-01 LAB — PROTIME-INR
INR: 2.9 — ABNORMAL HIGH (ref 0.8–1.2)
Prothrombin Time: 30 seconds — ABNORMAL HIGH (ref 11.4–15.2)

## 2022-01-01 MED ORDER — WARFARIN SODIUM 2.5 MG PO TABS
2.5000 mg | ORAL_TABLET | Freq: Once | ORAL | Status: AC
Start: 2022-01-01 — End: 2022-01-01
  Administered 2022-01-01: 2.5 mg via ORAL
  Filled 2022-01-01: qty 1

## 2022-01-01 MED ORDER — LORAZEPAM 2 MG/ML IJ SOLN
0.2500 mg | Freq: Once | INTRAMUSCULAR | Status: AC
  Administered 2022-01-01: 0.25 mg via INTRAVENOUS
  Filled 2022-01-01: qty 1

## 2022-01-01 MED ORDER — OXYCODONE HCL 5 MG PO TABS
10.0000 mg | ORAL_TABLET | ORAL | Status: DC | PRN
  Administered 2022-01-01 – 2022-01-03 (×7): 10 mg via ORAL
  Filled 2022-01-01 (×7): qty 2

## 2022-01-01 NOTE — Progress Notes (Signed)
? ? ?  OVERNIGHT PROGRESS REPORT ? ?Notified by RN for continued Nausea. ?Patient shows prolonged QTc on tracing. ?Low dose Ativan ordered due to this QTc recording  ? ? ?Gershon Cull MSNA MSN ACNPC-AG ?Acute Care Nurse Practitioner ?[facility] ?[place] ? ? ? ?

## 2022-01-01 NOTE — Progress Notes (Signed)
OT Cancellation Note ? ?Patient Details ?Name: Thomas [name] ?MRN: [id number] ?DOB: 14-Nov-1950 ? ? ?Cancelled Treatment:    Reason Eval/Treat Not Completed: Patient declined, no reason specified patient agreed to participate in getting out of bed. Once items were retrieved patient declined stating he was going to eat lunch first. OT to continue to follow and check back as schedule will allow.  ?Znya Albino OTR/L, MS ?Acute Rehabilitation Department ?Office# [phone] ?Pager# [phone] ? ? ?01/01/2022, 1:15 PM ?

## 2022-01-01 NOTE — Progress Notes (Signed)
ANTICOAGULATION CONSULT NOTE - follow up ? ?Pharmacy Consult for Coumadin ?Indication:  hx VTE ? ?Allergies  ?Allergen Reactions  ? Other Other (See Comments)  ?  Blood pressure issues ?Per [facility] hospital: Pt states he can only take these pain meds or else he gets very sick: Oxycontin,morphine,demerol, and dilaudid are the only pain meds pt states he can take.  ?  ? Oxymorphone Other (See Comments)  ?  Causes kidney problems  ? Aspirin Nausea And Vomiting  ?  Per [facility], pt does not know reaction   ? Beta Vulgaris Nausea And Vomiting  ? Buspirone Other (See Comments)  ?  Affected his head ?  ? Cabbage Nausea And Vomiting  ? Codeine Other (See Comments)  ?  Pt does not remember the reaction but knows he can not take ?  ? Diflunisal Nausea And Vomiting and Other (See Comments)  ? Fish Allergy Nausea And Vomiting  ? Fish-Derived Products Nausea And Vomiting  ? Hydroxyzine Nausea And Vomiting  ? Methadone Other (See Comments)  ?  Made him loopy  ? Pentazocine Other (See Comments)  ?  Pt does not remember the reaction   ? Propoxyphene Other (See Comments)  ?  Pt does not remember the reaction ? ?  ? Shellfish Allergy Nausea And Vomiting  ? Sulfa Antibiotics Hives  ? Sulfasalazine Other (See Comments)  ?  Pt does not remember the reaction  ? Amoxicillin Nausea And Vomiting and Rash  ?  Per [facility]  ? ? ?Patient Measurements: ?Height: 6\' 5"  (195.6 cm) ?Weight: 120.2 kg (265 lb) ?IBW/kg (Calculated) : 89.1 ? ?Vital Signs: ?Temp: 97.8 ?F (36.6 ?C) (03/15 0435) ?Temp Source: Oral (03/15 0435) ?BP: 174/97 (03/15 0435) ?Pulse Rate: 70 (03/15 0435) ? ?Labs: ?Recent Labs  ?  12/30/21 ?1810 12/31/21 ?0328 12/31/21 ?3664 01/01/22 ?0332  ?HGB 9.0* 8.2*  --  8.5*  ?HCT 29.2* 26.5*  --  26.7*  ?PLT 186 157  --  161  ?LABPROT 35.3*  --  35.1* 30.0*  ?INR 3.5*  --  3.5* 2.9*  ?CREATININE 4.47* 4.19*  --  3.19*  ? ? ? ?Estimated Creatinine Clearance: 31.8 mL/min (A) (by C-G formula based on SCr of 3.19 mg/dL  (H)). ? ? ?Medical History: ?Past Medical History:  ?Diagnosis Date  ? Arthritis   ? BPH (benign prostatic hyperplasia) 11/24/2019  ? Chronic pain syndrome 11/24/2019  ? CKD (chronic kidney disease), stage IV ([place]) 11/24/2019  ? Diabetes mellitus without complication ([place])   ? Gout   ? Hypertension   ? Muscle pain   ? Physical deconditioning 11/24/2019  ? Scars   ? Swelling   ? ? ?Home warfarin dose per med rec ?Warfarin 7.5mg  daiy, last dose past week ? ?Assessment: ?71 yo M on warfarin PTA for hx DVT/PE, continued inpatient ? ?INR now at upper end of therapeutic range ?No active bleeding noted. CBC- Hg low at 9.0; pltc WNL.   ?He is on Rocephin/doxy for cellulits v. PNA v. UTI which could cause elevated INR.  ? ?Goal of Therapy:  ?INR 2-3 ?  ?Plan:  ?With INR dropping down to therapeutic this AM and fact that per med rec patient has not had warfarin dose since last week and can therefore infer that warfarin 7.5mg  daily may be too high of daily dose, will give only 2.5mg  today at 1600 ?Daily PT/INR ? ? ? ?Adrian Saran, PharmD, BCPS ?Secure Chat if ?s ?01/01/2022 8:32 AM ? ? ?

## 2022-01-01 NOTE — Plan of Care (Signed)
  Problem: Clinical Measurements: Goal: Diagnostic test results will improve Outcome: Progressing Goal: Respiratory complications will improve Outcome: Progressing Goal: Cardiovascular complication will be avoided Outcome: Progressing   

## 2022-01-01 NOTE — Progress Notes (Signed)
?PROGRESS NOTE ? ? 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 Gloriann Loan ?GI bleed coffee-ground emesis 11/2019 Dr. 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 ? ? ?Hospital-Problem based course ? ?Cellulitis/infection lower extremity-recurrent ?Continue Rocephin/doxycycline at this time-narrow doxycycline based on general exam when I see him on 3/16 ?These wounds are probably going to be difficult to heal-he lives at home-Home health to investigate situation and do a home visit ??  Pneumonia ?CXR reviewed independently looks not really changed since last 1 performed ?We will repeat three-view x-ray in a.m. as this would also be representative of fluid ?De-escalate antibiotics off of Rocephin in the a.m. based on x-ray ?AKI superimposed on CKD 5 with hyperkalemia ?Prior severe right hydroureteronephrosis followed by Dr. Gloriann Loan ?Patient does not want dialysis and had meds adjusted by his outpatient nephrologist in [facility] ?He is at high risk for readmission ?Can continue his usual finasteride 5, tamsulosin 0.4 ?Combined systolic diastolic heart failure EF depressed 35% range 09/2021 ?He is currently at 120 KG and was discharged at 116 kg--- ?Stop IV fluid ?Continue metoprolol 50 twice daily, amlodipine 5 ?Prior DVTs and PEs in the past ?Continue Coumadin per pharmacy-INR therapeutic ? ?Very high risk for readmission ? ? ? ?DVT prophylaxis: Therapeutic on  Coumadin ?Is a full code ?Family Communication: nop ?Disposition:  ?Status is: Inpatient ?Remains inpatient appropriate because: Needs need to heal ?  ? ? ?Subjective: ?Doing fair no distress-sitting up comfortably ?Asking for his usual pain meds ?No chest pain ?Wound was wrapped this morning so I did not visualize ? ?Objective: ?Vitals:  ? 12/31/21 2122 12/31/21 2300 01/01/22 0435 01/01/22 1254  ?BP: (!) 168/84 (!) 152/78 (!) 174/97 (!) 167/98  ?Pulse:   70 70  ?Resp: 17 12 18 19   ?Temp:   97.8 ?F (36.6 ?C) 97.7 ?F (36.5 ?C)  ?TempSrc:   Oral Oral  ?SpO2:   100% 100%  ?Weight:      ?Height:      ? ? ?Intake/Output Summary (Last 24 hours) at 01/01/2022 1313 ?Last data filed at 01/01/2022 1210 ?Gross per 24 hour  ?Intake 2186.46 ml  ?Output 2550 ml  ?Net -363.54 ml  ? ?Filed Weights  ? 12/31/21 0100  ?Weight: 120.2 kg  ? ? ?Examination: ? ?EOMI NCAT no focal deficit CTA B no added sound rales rhonchi ?S1-S2 no murmur no rub ?Abdomen soft no rebound no guarding ?Lower extremity on left side is wrapped-he has a BKA with a well-healed stump on the right ?Psych is euthymic ? ?Data Reviewed: personally reviewed  ? ?CBC ?   ?Component Value Date/Time  ? WBC 5.7 01/01/2022 0332  ? RBC 2.66 (L) 01/01/2022 0332  ? HGB 8.5 (L) 01/01/2022 0332  ? HCT 26.7 (L) 01/01/2022 0332  ? PLT 161 01/01/2022 0332  ? MCV 100.4 (H) 01/01/2022 0332  ? MCH 32.0 01/01/2022 0332  ? MCHC 31.8 01/01/2022 0332  ? RDW 15.0 01/01/2022 0332  ? LYMPHSABS 1.3 12/30/2021 1810  ? MONOABS 0.6 12/30/2021 1810  ? EOSABS 0.3 12/30/2021  1810  ? BASOSABS 0.0 12/30/2021 1810  ? ?CMP Latest Ref Rng & Units 01/01/2022 12/31/2021 12/30/2021  ?Glucose 70 - 99 mg/dL 96 114(H) 122(H)  ?BUN 8 - 23 mg/dL 70(H) 89(H) 91(H)  ?Creatinine 0.61 - 1.24 mg/dL 3.19(H) 4.19(H) 4.47(H)  ?Sodium 135 - 145 mmol/L 141 139 136  ?Potassium 3.5 - 5.1 mmol/L 4.3 4.2 4.9  ?Chloride 98 - 111 mmol/L 110 107 105  ?CO2 22 - 32 mmol/L 21(L) 20(L) 19(L)  ?Calcium 8.9 - 10.3 mg/dL 7.7(L) 7.5(L)  7.6(L)  ?Total Protein 6.5 - 8.1 g/dL 7.7 7.8 8.8(H)  ?Total Bilirubin 0.3 - 1.2 mg/dL 0.1(L) 0.3 0.9  ?Alkaline Phos 38 - 126 U/L 73 75 86  ?AST 15 - 41 U/L 19 20 28   ?ALT 0 - 44 U/L 10 8 9   ? ? ? ?Radiology Studies: ?CT ABDOMEN PELVIS WO CONTRAST ? ?Result Date: 12/30/2021 ?CLINICAL DATA:  Bowel obstruction suspected Abdominal distention, pain, nausea, decreased bowel movement for the last 2 days. Also chills and left leg worsened redness concerning for worsening cellulitis. Patient has CKD and will likely not be able to get contrast IV. EXAM: CT ABDOMEN AND PELVIS WITHOUT CONTRAST TECHNIQUE: Multidetector CT imaging of the abdomen and pelvis was performed following the standard protocol without IV contrast. RADIATION DOSE REDUCTION: This exam was performed according to the departmental dose-optimization program which includes automated exposure control, adjustment of the mA and/or kV according to patient size and/or use of iterative reconstruction technique. COMPARISON:  CT abdomen pelvis 09/23/2021, CT renal 09/17/2021 FINDINGS: Lower chest: No acute abnormality. Hepatobiliary: No focal liver abnormality. No gallstones, gallbladder wall thickening, or pericholecystic fluid. No biliary dilatation. Pancreas: No focal lesion. Normal pancreatic contour. No surrounding inflammatory changes. No main pancreatic ductal dilatation. Spleen: Normal in size without focal abnormality. Adrenals/Urinary Tract: No adrenal nodule bilaterally. Severe right hydroureteronephrosis with associated right parenchymal thinning. Transition point noted at the distal right ureter (4:115). No definite contour-deforming renal mass. No nephrolithiasis or ureterolithiasis. No left hydronephrosis or hydroureter. Vague perivesicular fat stranding. Stomach/Bowel: Stomach is within normal limits. No evidence of bowel wall thickening or dilatation. Redundant sigmoid colon. Stool throughout the ascending colon and transverse colon. Appendix appears  normal. Vascular/Lymphatic: Inferior vena cava filter noted in similar position with tip along the left inferior vena cava wall (2:36). No abdominal aorta or iliac aneurysm. Mild atherosclerotic plaque of the aorta and its branches. No abdominal, pelvic, or inguinal lymphadenopathy. Reproductive: Prostate is unremarkable. Other: No intraperitoneal free fluid. No intraperitoneal free gas. No organized fluid collection. Musculoskeletal: Subcutaneus soft tissue edema along the mid lower back. No definite dermal thickening. No abdominal wall hernia or abnormality. No suspicious lytic or blastic osseous lesions. No acute displaced fracture. Dextroscoliosis of the lumbar spine. Multilevel spinous process and facet joint effusion. Thoracic and lumbar surgical hardware. Severe degenerative changes at the T10-11 level. Chronic vertebral body height loss of the visualized thoracic levels as well as L1 vertebral body. IMPRESSION: 1. Vague perivesicular fat stranding. Correlate with urinalysis for infection. 2. Severe right hydronephrosis with associated right parenchymal thinning. Transition point noted at the distal right ureter. This likely reflects chronic changes of obstructive uropathy in the setting of stricture or underlying mass lesion-limited evaluation on this noncontrast study. 3.  Aortic Atherosclerosis (ICD10-I70.0). Electronically Signed   By: Iven Finn M.D.   On: 12/30/2021 19:54  ? ?DG Tibia/Fibula Left ? ?Result Date: 12/30/2021 ?CLINICAL DATA:  Redness EXAM: LEFT TIBIA AND FIBULA - 2 VIEW COMPARISON:  None.  FINDINGS: Joint space narrowing in the knee joint. No acute bony abnormality. Specifically, no fracture, subluxation, or dislocation. No bone destruction to suggest osteomyelitis. No soft tissue gas. IMPRESSION: No acute bony abnormality. Electronically Signed   By: Rolm Baptise M.D.   On: 12/30/2021 19:03  ? ?DG Chest Portable 1 View ? ?Result Date: 12/30/2021 ?CLINICAL DATA:  Chills, fatigue.  Cough,  shortness of breath EXAM: PORTABLE CHEST 1 VIEW COMPARISON:  10/18/2021 FINDINGS: Prior median sternotomy. Heart is mildly enlarged. Aortic atherosclerosis. Patchy airspace opacities throughout the left lun

## 2022-01-01 NOTE — Progress Notes (Signed)
OT Cancellation Note ? ?Patient Details ?Name: Thomas [name] ?MRN: [id number] ?DOB: 1951/04/24 ? ? ?Cancelled Treatment:    Reason Eval/Treat Not Completed: Fatigue/lethargy limiting ability to participate OT to continue to follow and check back as schedule will allow.  ?Jesilyn Easom OTR/L, MS ?Acute Rehabilitation Department ?Office# [phone] ?Pager# [phone] ? ? ?[place] ?01/01/2022, 12:09 PM ?

## 2022-01-02 ENCOUNTER — Inpatient Hospital Stay (HOSPITAL_COMMUNITY): Payer: Medicare Other

## 2022-01-02 DIAGNOSIS — J189 Pneumonia, unspecified organism: Secondary | ICD-10-CM | POA: Diagnosis not present

## 2022-01-02 LAB — CBC WITH DIFFERENTIAL/PLATELET
Abs Immature Granulocytes: 0.02 10*3/uL (ref 0.00–0.07)
Basophils Absolute: 0 10*3/uL (ref 0.0–0.1)
Basophils Relative: 0 %
Eosinophils Absolute: 0.3 10*3/uL (ref 0.0–0.5)
Eosinophils Relative: 4 %
HCT: 28.8 % — ABNORMAL LOW (ref 39.0–52.0)
Hemoglobin: 9.2 g/dL — ABNORMAL LOW (ref 13.0–17.0)
Immature Granulocytes: 0 %
Lymphocytes Relative: 17 %
Lymphs Abs: 1.3 10*3/uL (ref 0.7–4.0)
MCH: 31.9 pg (ref 26.0–34.0)
MCHC: 31.9 g/dL (ref 30.0–36.0)
MCV: 100 fL (ref 80.0–100.0)
Monocytes Absolute: 0.5 10*3/uL (ref 0.1–1.0)
Monocytes Relative: 7 %
Neutro Abs: 5.1 10*3/uL (ref 1.7–7.7)
Neutrophils Relative %: 72 %
Platelets: 169 10*3/uL (ref 150–400)
RBC: 2.88 MIL/uL — ABNORMAL LOW (ref 4.22–5.81)
RDW: 14.6 % (ref 11.5–15.5)
WBC: 7.2 10*3/uL (ref 4.0–10.5)
nRBC: 0 % (ref 0.0–0.2)

## 2022-01-02 LAB — COMPREHENSIVE METABOLIC PANEL
ALT: 7 U/L (ref 0–44)
AST: 22 U/L (ref 15–41)
Albumin: 3.2 g/dL — ABNORMAL LOW (ref 3.5–5.0)
Alkaline Phosphatase: 72 U/L (ref 38–126)
Anion gap: 8 (ref 5–15)
BUN: 49 mg/dL — ABNORMAL HIGH (ref 8–23)
CO2: 21 mmol/L — ABNORMAL LOW (ref 22–32)
Calcium: 7.7 mg/dL — ABNORMAL LOW (ref 8.9–10.3)
Chloride: 109 mmol/L (ref 98–111)
Creatinine, Ser: 2.72 mg/dL — ABNORMAL HIGH (ref 0.61–1.24)
GFR, Estimated: 25 mL/min — ABNORMAL LOW (ref >60)
Glucose, Bld: 119 mg/dL — ABNORMAL HIGH (ref 70–99)
Potassium: 4.3 mmol/L (ref 3.5–5.1)
Sodium: 138 mmol/L (ref 135–145)
Total Bilirubin: 0.3 mg/dL (ref 0.3–1.2)
Total Protein: 7.8 g/dL (ref 6.5–8.1)

## 2022-01-02 LAB — GLUCOSE, CAPILLARY
Glucose-Capillary: 95 mg/dL (ref 70–99)
Glucose-Capillary: 130 mg/dL — ABNORMAL HIGH (ref 70–99)
Glucose-Capillary: 107 mg/dL — ABNORMAL HIGH (ref 70–99)
Glucose-Capillary: 136 mg/dL — ABNORMAL HIGH (ref 70–99)

## 2022-01-02 LAB — PROTIME-INR
INR: 2.2 — ABNORMAL HIGH (ref 0.8–1.2)
Prothrombin Time: 24.4 seconds — ABNORMAL HIGH (ref 11.4–15.2)

## 2022-01-02 IMAGING — DX DG CHEST 2V
2 series · 2 of 2 positions shown · non-contrast
Comparison: [DATE]

CLINICAL DATA: Shortness of breath, pain

EXAM:
CHEST - 2 VIEW

[chest lat]
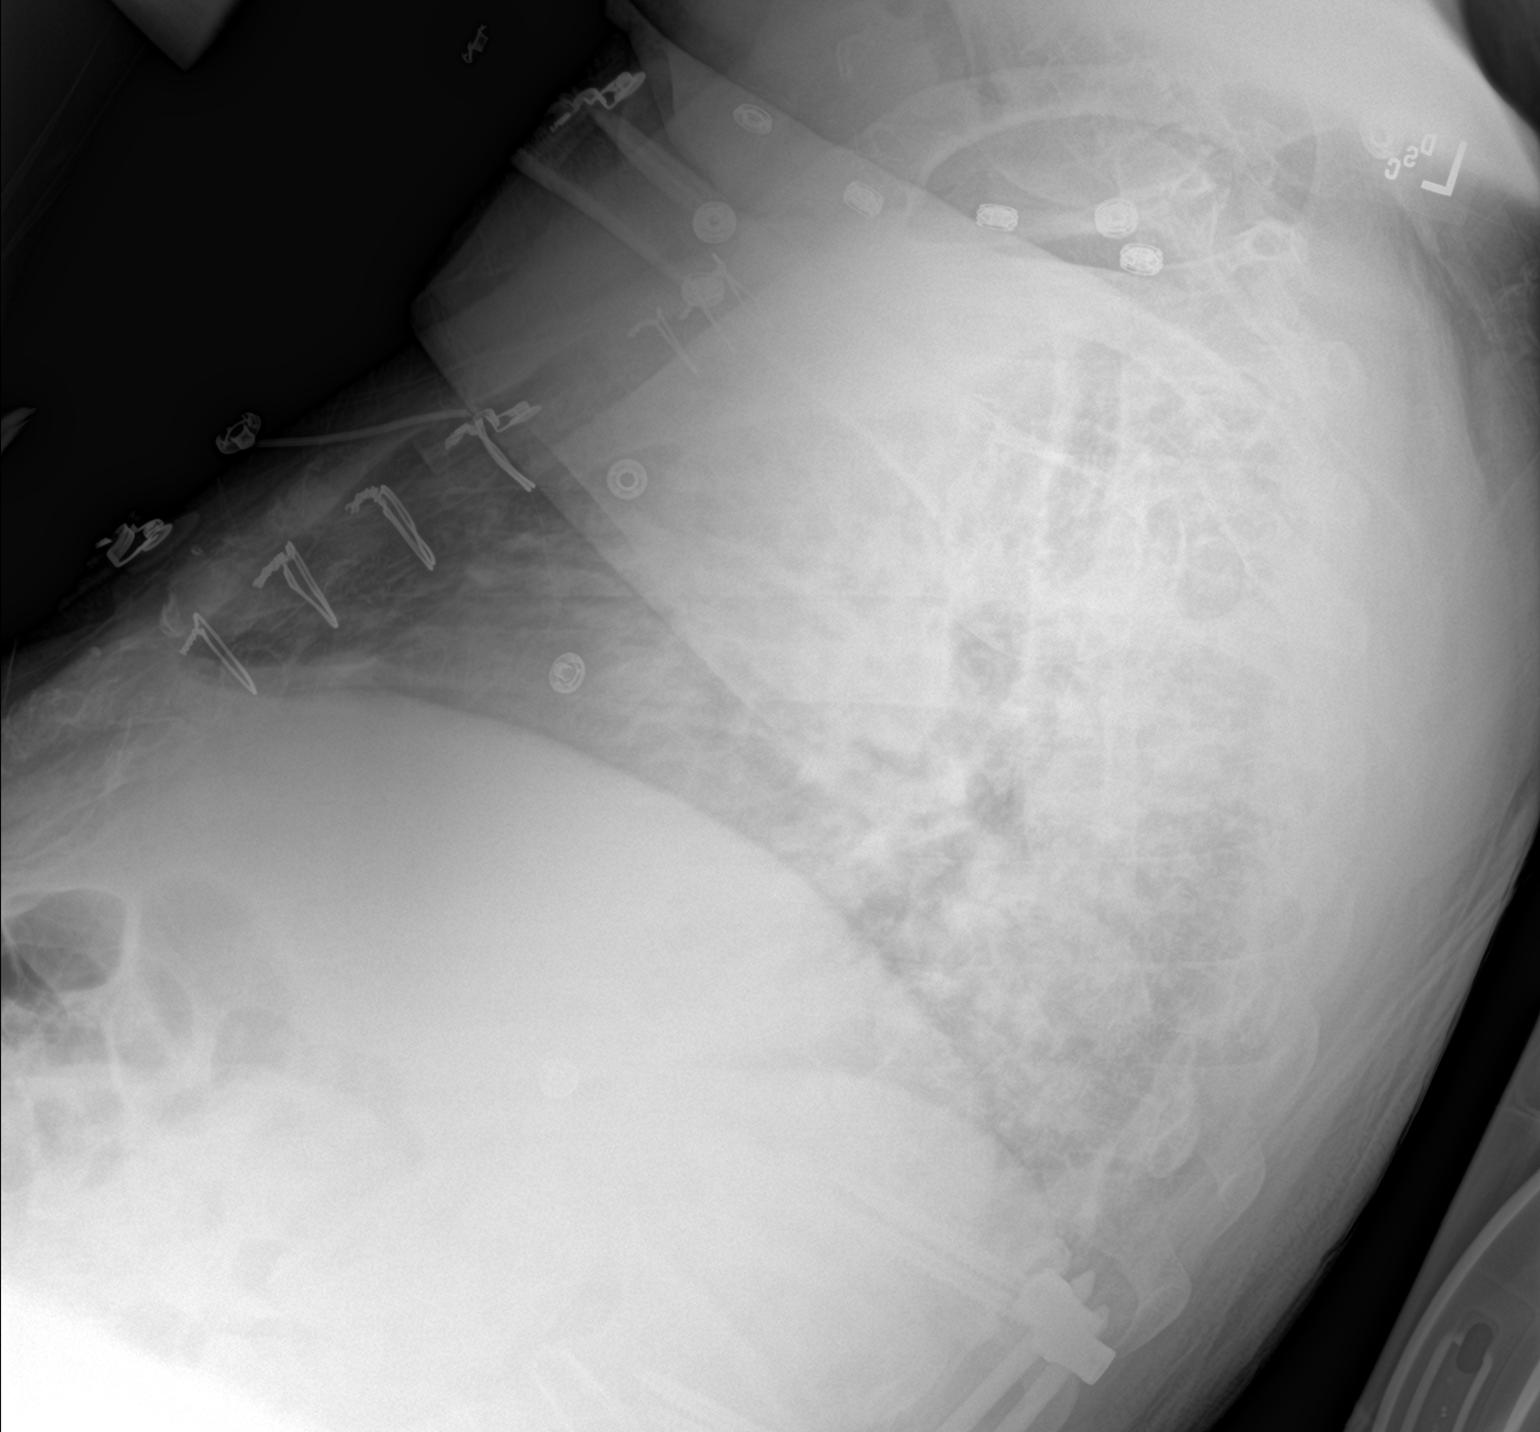

[chest ap]
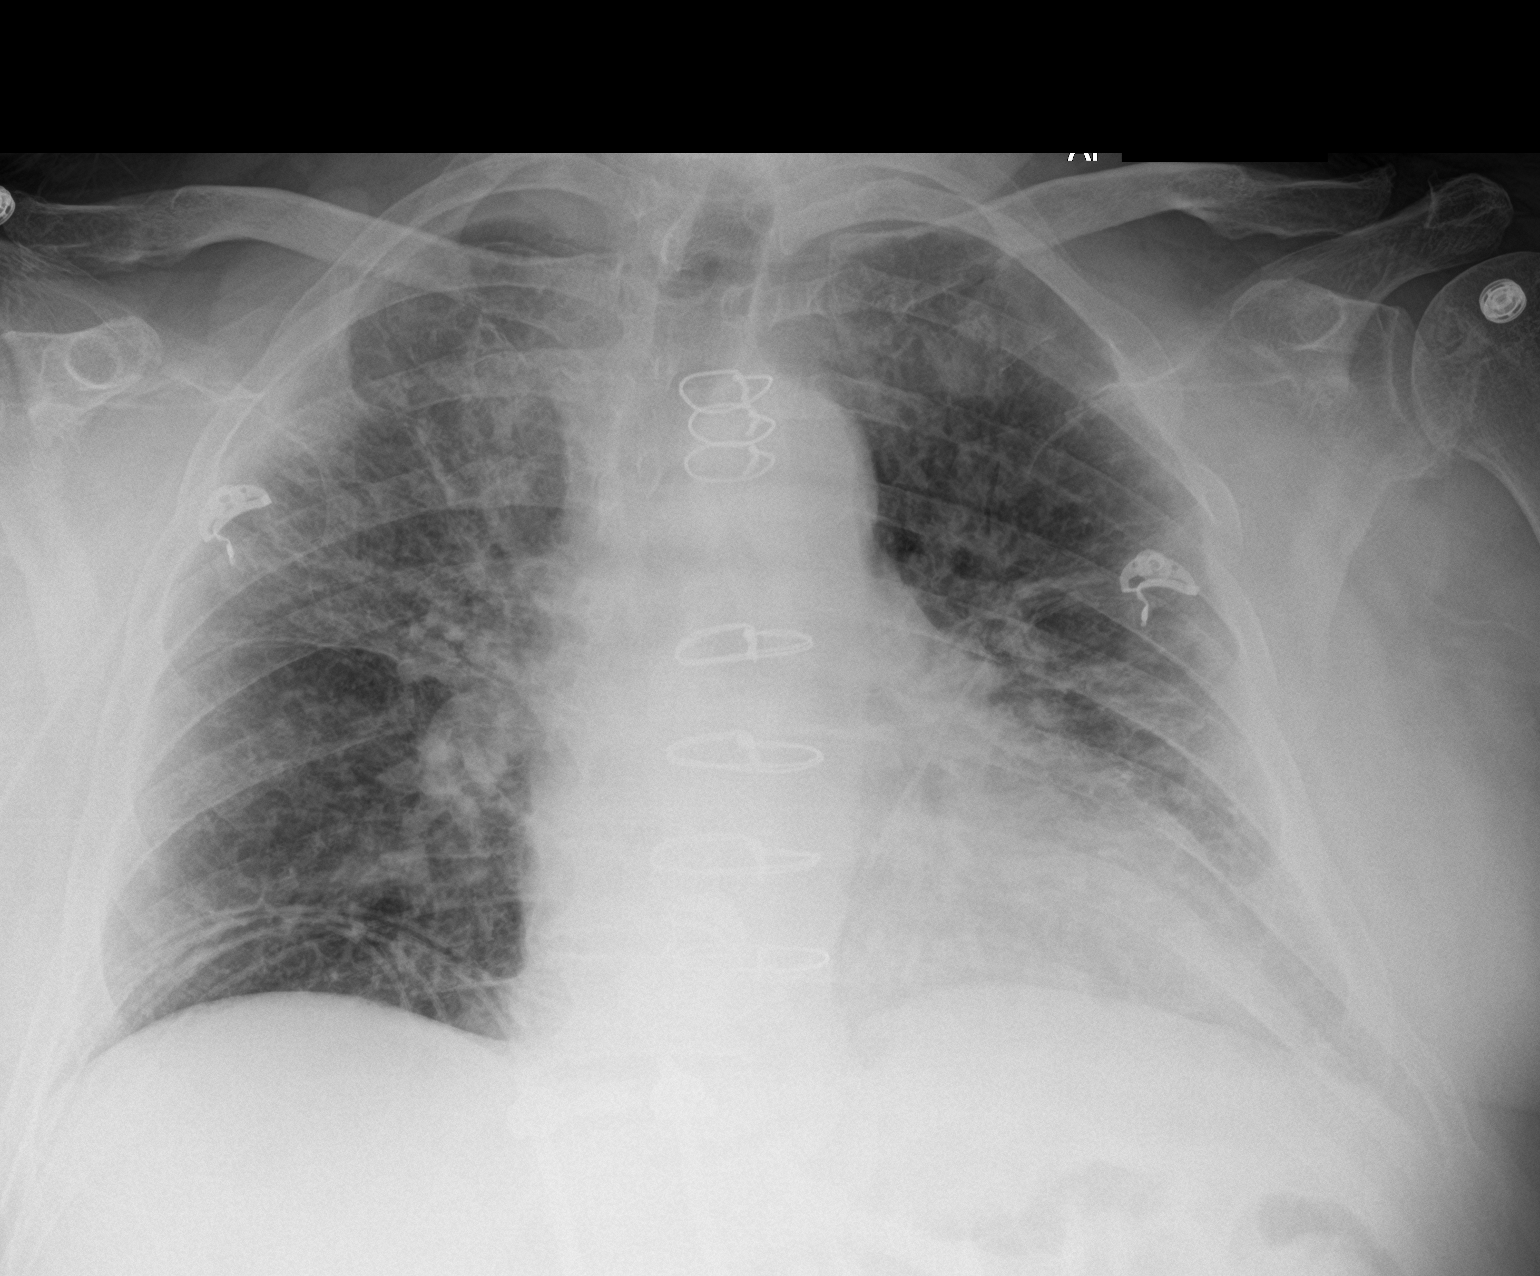

[2 of 2 positions shown; findings below may reference images not displayed]

FINDINGS: Cardiomegaly status post median sternotomy. Diffuse bilateral
interstitial pulmonary opacity, somewhat more conspicuous in the
right upper lobe and left lung base, similar to prior examination.
No new airspace opacity. The visualized skeletal structures are
unremarkable.
IMPRESSION: 1. Cardiomegaly.
2. Diffuse bilateral interstitial pulmonary opacity, somewhat more
conspicuous in the right upper lobe and left lung base, similar to
prior examination and may reflect asymmetric edema and/or multifocal
infection. No new airspace opacity.

## 2022-01-02 MED ORDER — WARFARIN SODIUM 5 MG PO TABS
7.5000 mg | ORAL_TABLET | Freq: Once | ORAL | Status: AC
  Administered 2022-01-02: 7.5 mg via ORAL
  Filled 2022-01-02: qty 1

## 2022-01-02 MED ORDER — CEFDINIR 300 MG PO CAPS
300.0000 mg | ORAL_CAPSULE | Freq: Two times a day (BID) | ORAL | Status: DC
  Administered 2022-01-02 – 2022-01-03 (×2): 300 mg via ORAL
  Filled 2022-01-02 (×3): qty 1

## 2022-01-02 MED ORDER — AMLODIPINE BESYLATE 5 MG PO TABS
5.0000 mg | ORAL_TABLET | Freq: Every day | ORAL | Status: AC
  Administered 2022-01-02: 5 mg via ORAL
  Filled 2022-01-02: qty 1

## 2022-01-02 MED ORDER — AMLODIPINE BESYLATE 10 MG PO TABS
10.0000 mg | ORAL_TABLET | Freq: Every morning | ORAL | Status: DC
  Administered 2022-01-03: 10 mg via ORAL
  Filled 2022-01-02: qty 1

## 2022-01-02 MED ORDER — POLYETHYLENE GLYCOL 3350 17 G PO PACK
17.0000 g | PACK | Freq: Every day | ORAL | Status: DC
  Administered 2022-01-03: 17 g via ORAL
  Filled 2022-01-02 (×2): qty 1

## 2022-01-02 MED ORDER — HYDRALAZINE HCL 25 MG PO TABS
25.0000 mg | ORAL_TABLET | Freq: Four times a day (QID) | ORAL | Status: DC | PRN
  Administered 2022-01-02 – 2022-01-03 (×2): 25 mg via ORAL
  Filled 2022-01-02 (×2): qty 1

## 2022-01-02 NOTE — Progress Notes (Signed)
?PROGRESS NOTE ? ? Thomas [name]  HYQ:[id number] DOB: 07/11/1951 DOA: 12/30/2021 ?PCP: Antonietta Jewel, MD  ?Brief Narrative:  ? 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 Gloriann Loan ?GI bleed coffee-ground emesis 11/2019 Dr. 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 ? ? ?Hospital-Problem based course ? ?Cellulitis/infection lower extremity-recurrent ?Rocephin/doxycycline narrowed to [facility] monotherapy 3/16 ?See pictures below attached to chart ??  Pneumonia ?CXR 3/16 = confluence in right upper lobe but not dissimilar from x-ray 3/14 ?Continue de-escalation to [facility] as above-I do not think this is a pneumonia but rather superimposed fluid from CKD 5 ?Patient had just recently finished a long course of antibiotics and would not treat beyond another 2 to 3 days for cellulitis as above ?AKI superimposed on CKD 5 with hyperkalemia ?Prior severe right hydroureteronephrosis followed by Dr. Gloriann Loan ?Patient does not want dialysis and had meds adjusted by his outpatient nephrologist in [facility] ?He is at high risk for readmission ?Can continue his usual finasteride 5, tamsulosin 0.4 ?Combined systolic diastolic heart failure EF depressed 35% range 09/2021 ?He is currently at 120 KG and was discharged at 116 kg--- ?Stop IV fluid-give 1 dose of Lasix 40 mg today ?Continue metoprolol 50 twice daily, amlodipine 5 ?Prior DVTs and PEs in the past ?Continue Coumadin per pharmacy-INR therapeutic ? ?Very high risk for readmission-continues to refuse hospice and continues to  refuse dialysis-I have discussed this case with TOC and I expect he will be readmitted within the next 30 to 60 days ? ? ? ?DVT prophylaxis: Therapeutic on Coumadin ?Is a full code ?Family Communication: nop ?Disposition:  ?Status is: Inpatient ?Remains inpatient appropriate because: Needs need to heal ?  ? ? ?Subjective: ? ?Sitting up comfortable no distress ?States that left lower extremity feels fair ?Nursing reports some nausea earlier this morning-patient is noncompliant on renal diet and continues to eat what he desires ?No chest pain ?ROM intact he has not been out of bed since admission EOMI NCAT no focal deficit moderately groomed ?S1-S2 no murmur ? ? ?Objective: ?Vitals:  ? 01/02/22 0430 01/02/22 0500 01/02/22 1231 01/02/22 1425  ?BP: (!) 182/94  (!) 185/109 (!) 197/108  ?Pulse: 67  74   ?Resp: 20  16   ?Temp: 98.6 ?F (37 ?C)  98.4 ?F (36.9 ?C)   ?TempSrc: Oral  Oral   ?SpO2: 97%  98%   ?Weight:  121.2 kg    ?Height:      ? ? ?Intake/Output Summary (Last 24 hours) at 01/02/2022 1638 ?Last data filed at 01/02/2022 1400 ?Gross per 24 hour  ?Intake 2383.61 ml  ?Output 4100 ml  ?Net -1716.39 ml  ? ? ?Filed Weights  ? 12/31/21 0100 01/02/22 0500  ?Weight: 120.2 kg 121.2 kg  ? ? ?Examination: ? ?EOMI NCAT no focal deficit ?Moderately groomed ?Chest clear to exam no egophony no fremitus ?Abdomen soft no rebound no guarding ?Right BKA noted BKA stump is clean ?Left lower extremity wounds as per below ? ? ? ? ? ? ? ?Data Reviewed: personally reviewed  ? ?CBC ?   ?Component Value Date/Time  ? WBC 7.2 01/02/2022 0336  ? RBC  2.88 (L) 01/02/2022 0336  ? HGB 9.2 (L) 01/02/2022 0336  ? HCT 28.8 (L) 01/02/2022 0336  ? PLT 169 01/02/2022 0336  ? MCV 100.0 01/02/2022 0336  ? MCH 31.9 01/02/2022 0336  ? MCHC 31.9 01/02/2022 0336  ? RDW 14.6 01/02/2022 0336  ? LYMPHSABS 1.3 01/02/2022 0336  ? MONOABS 0.5 01/02/2022 0336  ? EOSABS 0.3 01/02/2022 0336  ? BASOSABS 0.0 01/02/2022 0336  ? ?CMP Latest Ref Rng & Units 01/02/2022  01/01/2022 12/31/2021  ?Glucose 70 - 99 mg/dL 119(H) 96 114(H)  ?BUN 8 - 23 mg/dL 49(H) 70(H) 89(H)  ?Creatinine 0.61 - 1.24 mg/dL 2.72(H) 3.19(H) 4.19(H)  ?Sodium 135 - 145 mmol/L 138 141 139  ?Potassium 3.5 - 5.1 mmol/L 4.3 4.3 4.2  ?Chloride 98 - 111 mmol/L 109 110 107  ?CO2 22 - 32 mmol/L 21(L) 21(L) 20(L)  ?Calcium 8.9 - 10.3 mg/dL 7.7(L) 7.7(L) 7.5(L)  ?Total Protein 6.5 - 8.1 g/dL 7.8 7.7 7.8  ?Total Bilirubin 0.3 - 1.2 mg/dL 0.3 0.1(L) 0.3  ?Alkaline Phos 38 - 126 U/L 72 73 75  ?AST 15 - 41 U/L 22 19 20   ?ALT 0 - 44 U/L 7 10 8   ? ? ? ?Radiology Studies: ?DG Chest 2 View ? ?Result Date: 01/02/2022 ?CLINICAL DATA:  Shortness of breath, pain EXAM: CHEST - 2 VIEW COMPARISON:  12/30/2021 FINDINGS: Cardiomegaly status post median sternotomy. Diffuse bilateral interstitial pulmonary opacity, somewhat more conspicuous in the right upper lobe and left lung base, similar to prior examination. No new airspace opacity. The visualized skeletal structures are unremarkable. IMPRESSION: 1. Cardiomegaly. 2. Diffuse bilateral interstitial pulmonary opacity, somewhat more conspicuous in the right upper lobe and left lung base, similar to prior examination and may reflect asymmetric edema and/or multifocal infection. No new airspace opacity. Electronically Signed   By: Delanna Ahmadi M.D.   On: 01/02/2022 08:53   ? ? ?Scheduled Meds: ? [START ON 01/03/2022] amLODipine  10 mg Oral q morning  ? atorvastatin  80 mg Oral Daily  ? finasteride  5 mg Oral q morning  ? gabapentin  100 mg Oral TID  ? insulin aspart  0-15 Units Subcutaneous TID WC & HS  ? levothyroxine  50 mcg Oral Q0600  ? metoprolol tartrate  50 mg Oral BID  ? polyethylene glycol  17 g Oral Daily  ? senna-docusate  2 tablet Oral BID  ? tamsulosin  0.4 mg Oral Daily  ? Warfarin - Pharmacist Dosing Inpatient   Does not apply [id number]  ? ?Continuous Infusions: ? cefTRIAXone (ROCEPHIN)  IV 2 g (01/01/22 1958)  ? doxycycline (VIBRAMYCIN) IV 100 mg (01/02/22 1052)  ? ? ? LOS: 3 days   ? ?Time spent: 23 ? ?Nita Sells, MD ?[facility] ?To contact the attending provider between 7A-7P or the covering provider during after hours 7P-7A, please log into the web site [email or web address] and access using universal [place] password for that web site. If you do not have the password, please call the hospital operator. ? ?01/02/2022, 4:38 PM  ? ? ?

## 2022-01-02 NOTE — TOC Initial Note (Addendum)
Transition of Care (TOC) - Initial/Assessment Note  ? ? ?Patient Details  ?Name: Thomas [name] ?MRN: [id number] ?Date of Birth: 29-Sep-1951 ? ?Transition of Care (TOC) CM/SW Contact:    ?Berle Fitz, Marjie Skiff, RN ?Phone Number: ?01/02/2022, 2:10 PM ? ?Clinical Narrative:                 ?Pt had been set up with [facility] for home health services previously. Per [place] liaison they went out once and the pt mentioned hospice so they discontinued services. Latricia Heft will provide HHPT/OT/RN at dc. Will need MD orders. ? ?Addendum 3818: Received call from [place] who states that [facility] referral was given to them from [place] as [facility] cannot staff case. New info placed on AVS. Will let pt know. ? ?Expected Discharge Plan: [place] ?Barriers to Discharge: Continued Medical Work up ? ? ?Patient Goals and CMS Choice ?Patient states their goals for this hospitalization and ongoing recovery are:: To go home ?CMS Medicare.gov Compare Post Acute Care list provided to:: Patient ?Choice offered to / list presented to : Patient ? ?Expected Discharge Plan and Services ?Expected Discharge Plan: [place] ?  ?Discharge Planning Services: CM Consult ?Post Acute Care Choice: Home Health ?Living arrangements for the past 2 months: Apartment ?                ?  ?  ?  ?  ?  ?HH Arranged: PT, OT, RN ?[place] Agency: [place] ?Date HH Agency Contacted: 01/02/22 ?Time [place]: 2993 ?Representative spoke with at [place]: [place] ? ?Prior Living Arrangements/Services ?Living arrangements for the past 2 months: Apartment ?Lives with:: Self ?Patient language and need for interpreter reviewed:: Yes ?Do you feel safe going back to the place where you live?: Yes      ?Need for Family Participation in Patient Care: Yes (Comment) ?Care giver support system in place?: No (comment) ?Current home services: Home OT, Home PT, Home RN ?Criminal Activity/Legal Involvement Pertinent to Current  Situation/Hospitalization: No - Comment as needed ? ?Activities of Daily Living ?Home Assistive Devices/Equipment: Prosthesis, Gilford Rile (specify type) ?ADL Screening (condition at time of admission) ?Patient's cognitive ability adequate to safely complete daily activities?: No ?Is the patient deaf or have difficulty hearing?: No ?Does the patient have difficulty seeing, even when wearing glasses/contacts?: No ?Does the patient have difficulty concentrating, remembering, or making decisions?: No ?Patient able to express need for assistance with ADLs?: Yes ?Does the patient have difficulty dressing or bathing?: No ?Independently performs ADLs?: No ?Communication: Independent ?Dressing (OT): Needs assistance ?Is this a change from baseline?: Change from baseline, expected to last <3days ?Grooming: Needs assistance ?Is this a change from baseline?: Change from baseline, expected to last <3 days ?Feeding: Independent with device (comment) (prosthesis) ?Bathing: Needs assistance ?Is this a change from baseline?: Change from baseline, expected to last <3 days ?Toileting: Needs assistance ?Is this a change from baseline?: Change from baseline, expected to last <3 days ?In/Out Bed: Needs assistance ?Is this a change from baseline?: Change from baseline, expected to last <3 days ?Walks in Home: Independent with device (comment) (prosthesis) ?Does the patient have difficulty walking or climbing stairs?: Yes ?Weakness of Legs: Both ?Weakness of Arms/Hands: None ? ?Permission Sought/Granted ?Permission sought to share information with : Customer service manager ?Permission granted to share information with : Yes, Verbal Permission Granted ?   ? Permission granted to share info w AGENCY: [facility] ?   ?   ? ?  Emotional Assessment ?Appearance:: Appears stated age ?Attitude/Demeanor/Rapport: Engaged ?Affect (typically observed): Calm ?Orientation: : Oriented to Self, Oriented to Place, Oriented to  Time, Oriented to  Situation ?Alcohol / Substance Use: Not Applicable ?Psych Involvement: No (comment) ? ?Admission diagnosis:  Peripheral edema [R60.9] ?Cellulitis of left lower extremity [L03.116] ?Acute cystitis without hematuria [N30.00] ?Multifocal pneumonia [J18.9] ?Patient Active Problem List  ? Diagnosis Date Noted  ? UTI (urinary tract infection) 12/31/2021  ? Chronic anticoagulation 12/31/2021  ? Prolonged QT interval 12/31/2021  ? Multifocal pneumonia 12/30/2021  ? SVT (supraventricular tachycardia) ([place]) 12/11/2021  ? Obesity, Class I, BMI 30-34.9 12/10/2021  ? Left leg cellulitis 12/07/2021  ? Acute metabolic encephalopathy 46/50/3546  ? Hyperkalemia 10/09/2021  ? Hypothyroidism 10/09/2021  ? Hydronephrosis of right kidney 10/09/2021  ? Hypoglycemia 10/07/2021  ? DVT (deep venous thrombosis) ([place]) 10/07/2021  ? Acute renal failure superimposed on chronic kidney disease ([place]) 09/17/2021  ? CAP (community acquired pneumonia) 09/17/2021  ? Cellulitis of left lower extremity   ? Pressure injury of skin 06/03/2021  ? AKI (acute kidney injury) ([place]) 06/02/2021  ? Pain management   ? Cord compression [facility])   ? Drug-induced constipation   ? Acute midline low back pain with sciatica   ? 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 Urinary retention 12/04/2019  ? BPH (benign prostatic hyperplasia) 11/24/2019  ? CKD (chronic kidney disease), stage IV ([place]) 11/24/2019  ? Chronic pain syndrome 11/24/2019  ? Physical deconditioning 11/24/2019  ? 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 Pulmonary emboli ([place]) 09/29/2018  ? [name], right ([place]) 09/29/2018  ? [name], type 2) ([place]) 09/29/2018  ? Gout 09/29/2018  ? HTN (hypertension) 09/29/2018  ? Chronic combined systolic and diastolic congestive heart failure ([place]) 09/29/2018  ? GERD (gastroesophageal reflux disease) 09/29/2018  ? ?PCP:  Antonietta Jewel, MD ?Pharmacy:   ?[facility] [address] ?[place] [address] ?Phone: [phone] Fax: [phone] ? ? ? ? ?Social Determinants of Health (SDOH) Interventions ?  ? ?Readmission Risk Interventions ?Readmission Risk Prevention Plan 01/02/2022 12/31/2021 12/20/2021  ?Transportation Screening Complete Complete -  ?PCP or Specialist Appt within 5-7 Days - - -  ?Not Complete comments - - -  ?PCP or Specialist Appt within 3-5 Days - - -  ?Home Care Screening - - -  ?Medication Review (RN CM) - - -  ?[place] or Home Care Consult - - -  ?Social Work Consult for [place] Planning/Counseling - - -  ?Palliative Care Screening - - -  ?Medication Review Press photographer) Complete Complete -  ?PCP or Specialist appointment within 3-5 days of discharge Complete Complete -  ?[place] or Home Care Consult Complete Complete Complete  ?SW Recovery Care/Counseling Consult Complete Complete Complete  ?Palliative Care Screening Not Applicable Not Applicable Not Applicable  ?[place] Not Applicable Not Applicable Not Applicable  ?Some recent data might be hidden  ? ? ? ?

## 2022-01-02 NOTE — Progress Notes (Signed)
PT Cancellation Note ? ?Patient Details ?Name: MARQUON ALCALA ?MRN: [id number] ?DOB: 1951-06-23 ? ? ?Cancelled Treatment:    Reason Eval/Treat Not Completed: Medical issues which prohibited therapy (BP 197/108, will follow.) ? ? ?Blondell Reveal Kistler PT 01/02/2022  ?Acute Rehabilitation Services ?Pager [phone] ?Office [phone] ? ?

## 2022-01-02 NOTE — Progress Notes (Signed)
ANTICOAGULATION CONSULT NOTE - follow up ? ?Pharmacy Consult for Coumadin ?Indication:  hx VTE ? ?Allergies  ?Allergen Reactions  ? Other Other (See Comments)  ?  Blood pressure issues ?Per [facility] hospital: Pt states he can only take these pain meds or else he gets very sick: Oxycontin,morphine,demerol, and dilaudid are the only pain meds pt states he can take.  ?  ? Oxymorphone Other (See Comments)  ?  Causes kidney problems  ? Aspirin Nausea And Vomiting  ?  Per [facility], pt does not know reaction   ? Beta Vulgaris Nausea And Vomiting  ? Buspirone Other (See Comments)  ?  Affected his head ?  ? Cabbage Nausea And Vomiting  ? Codeine Other (See Comments)  ?  Pt does not remember the reaction but knows he can not take ?  ? Diflunisal Nausea And Vomiting and Other (See Comments)  ? Fish Allergy Nausea And Vomiting  ? Fish-Derived Products Nausea And Vomiting  ? Hydroxyzine Nausea And Vomiting  ? Methadone Other (See Comments)  ?  Made him loopy  ? Pentazocine Other (See Comments)  ?  Pt does not remember the reaction   ? Propoxyphene Other (See Comments)  ?  Pt does not remember the reaction ? ?  ? Shellfish Allergy Nausea And Vomiting  ? Sulfa Antibiotics Hives  ? Sulfasalazine Other (See Comments)  ?  Pt does not remember the reaction  ? Amoxicillin Nausea And Vomiting and Rash  ?  Per [facility]  ? ? ?Patient Measurements: ?Height: 6\' 5"  (195.6 cm) ?Weight: 121.2 kg (267 lb 3.2 oz) ?IBW/kg (Calculated) : 89.1 ? ?Vital Signs: ?Temp: 98.6 ?F (37 ?C) (03/16 0430) ?Temp Source: Oral (03/16 0430) ?BP: 182/94 (03/16 0430) ?Pulse Rate: 67 (03/16 0430) ? ?Labs: ?Recent Labs  ?  12/31/21 ?0328 12/31/21 ?8676 01/01/22 ?1950 01/02/22 ?0336  ?HGB 8.2*  --  8.5* 9.2*  ?HCT 26.5*  --  26.7* 28.8*  ?PLT 157  --  161 169  ?LABPROT  --  35.1* 30.0* 24.4*  ?INR  --  3.5* 2.9* 2.2*  ?CREATININE 4.19*  --  3.19* 2.72*  ? ? ? ?Estimated Creatinine Clearance: 37.5 mL/min (A) (by C-G formula based on SCr of 2.72 mg/dL  (H)). ? ?Home warfarin dose per med rec ?Warfarin 7.5mg  day, last dose past week ? ?Assessment: ?71 yo M on warfarin PTA for hx DVT/PE, continued inpatient ? ?Today, 01/02/2022: ?INR remains therapeutic but significantly lower than yesterday ?No active bleeding noted ?Hgb low but stable; Plt stable WNL   ?On Rocephin/doxy for cellulits v. PNA v. UTI which could cause elevated INR ?Diet ordered, intake not fully charted, but eating well per RN ? ?Goal of Therapy:  ?INR 2-3 ?  ?Plan:  ?Warfarin 7.5 mg PO x 1 tonight - pt was supratherapeutic on admission even after holding warfarin for almost 1 week, but suspect this was driven by reduced PO intake, which seems much improved now per RN ?Daily PT/INR ?Until warfarin INR trend becomes more evident, recommend discharging on 5 mg daily with INR recheck in 3-5 days ? ?Reuel Boom, PharmD, BCPS ?[phone] ?01/02/2022, 8:32 AM ? ? ? ?

## 2022-01-02 NOTE — Plan of Care (Signed)
  Problem: Clinical Measurements: Goal: Diagnostic test results will improve Outcome: Progressing Goal: Respiratory complications will improve Outcome: Progressing   Problem: Nutrition: Goal: Adequate nutrition will be maintained Outcome: Progressing   

## 2022-01-03 DIAGNOSIS — J189 Pneumonia, unspecified organism: Secondary | ICD-10-CM | POA: Diagnosis not present

## 2022-01-03 LAB — CBC WITH DIFFERENTIAL/PLATELET
Abs Immature Granulocytes: 0.02 10*3/uL (ref 0.00–0.07)
Basophils Absolute: 0 10*3/uL (ref 0.0–0.1)
Basophils Relative: 0 %
Eosinophils Absolute: 0.3 10*3/uL (ref 0.0–0.5)
Eosinophils Relative: 4 %
HCT: 29.8 % — ABNORMAL LOW (ref 39.0–52.0)
Hemoglobin: 9.8 g/dL — ABNORMAL LOW (ref 13.0–17.0)
Immature Granulocytes: 0 %
Lymphocytes Relative: 20 %
Lymphs Abs: 1.3 10*3/uL (ref 0.7–4.0)
MCH: 31.8 pg (ref 26.0–34.0)
MCHC: 32.9 g/dL (ref 30.0–36.0)
MCV: 96.8 fL (ref 80.0–100.0)
Monocytes Absolute: 0.5 10*3/uL (ref 0.1–1.0)
Monocytes Relative: 8 %
Neutro Abs: 4.6 10*3/uL (ref 1.7–7.7)
Neutrophils Relative %: 68 %
Platelets: 180 10*3/uL (ref 150–400)
RBC: 3.08 MIL/uL — ABNORMAL LOW (ref 4.22–5.81)
RDW: 14.8 % (ref 11.5–15.5)
WBC: 6.8 10*3/uL (ref 4.0–10.5)
nRBC: 0 % (ref 0.0–0.2)

## 2022-01-03 LAB — GLUCOSE, CAPILLARY
Glucose-Capillary: 98 mg/dL (ref 70–99)
Glucose-Capillary: 127 mg/dL — ABNORMAL HIGH (ref 70–99)
Glucose-Capillary: 159 mg/dL — ABNORMAL HIGH (ref 70–99)

## 2022-01-03 LAB — RENAL FUNCTION PANEL
Albumin: 3.1 g/dL — ABNORMAL LOW (ref 3.5–5.0)
Anion gap: 8 (ref 5–15)
BUN: 39 mg/dL — ABNORMAL HIGH (ref 8–23)
CO2: 23 mmol/L (ref 22–32)
Calcium: 7.9 mg/dL — ABNORMAL LOW (ref 8.9–10.3)
Chloride: 107 mmol/L (ref 98–111)
Creatinine, Ser: 2.3 mg/dL — ABNORMAL HIGH (ref 0.61–1.24)
GFR, Estimated: 30 mL/min — ABNORMAL LOW (ref >60)
Glucose, Bld: 89 mg/dL (ref 70–99)
Phosphorus: 4.8 mg/dL — ABNORMAL HIGH (ref 2.5–4.6)
Potassium: 4.2 mmol/L (ref 3.5–5.1)
Sodium: 138 mmol/L (ref 135–145)

## 2022-01-03 LAB — PROTIME-INR
INR: 2.3 — ABNORMAL HIGH (ref 0.8–1.2)
Prothrombin Time: 24.9 seconds — ABNORMAL HIGH (ref 11.4–15.2)

## 2022-01-03 MED ORDER — WARFARIN SODIUM 5 MG PO TABS
7.5000 mg | ORAL_TABLET | Freq: Once | ORAL | Status: AC
  Administered 2022-01-03: 7.5 mg via ORAL
  Filled 2022-01-03: qty 1

## 2022-01-03 MED ORDER — SENNOSIDES-DOCUSATE SODIUM 8.6-50 MG PO TABS: 1.0000 | ORAL_TABLET | Freq: Two times a day (BID) | ORAL | 0 refills | Status: AC

## 2022-01-03 MED ORDER — WARFARIN SODIUM 5 MG PO TABS: 2.5000 mg | ORAL_TABLET | Freq: Every day | ORAL | Status: DC

## 2022-01-03 MED ORDER — POLYETHYLENE GLYCOL 3350 17 G PO PACK: 17.0000 g | PACK | Freq: Every day | ORAL | 0 refills | Status: DC

## 2022-01-03 MED ORDER — CEFDINIR 300 MG PO CAPS: 300.0000 mg | ORAL_CAPSULE | Freq: Two times a day (BID) | ORAL | 0 refills | Status: DC

## 2022-01-03 NOTE — Progress Notes (Signed)
OT Cancellation Note ? ?Patient Details ?Name: Thomas [name] ?MRN: [id number] ?DOB: 28-May-1951 ? ? ?Cancelled Treatment:    Reason Eval/Treat Not Completed: Patient declined, no reason specified. Patent declined therapy twice this morning - first because he wanted to order and eat his breakfast and the second time to "rest his back." Will f/u as able.  ? ?Lenward Chancellor ?01/03/2022, 10:02 AM ?

## 2022-01-03 NOTE — Progress Notes (Signed)
Physical Therapy Treatment ?Patient Details ?Name: Thomas [name] ?MRN: [id number] ?DOB: October 30, 1950 ?Today's Date: 01/03/2022 ? ? ?History of Present Illness Patient is a 71 year old male who presented to the hospital with fatigue, pain and edema in LLE. recent d/c on 12/24/21 with worsening LLE cellulitis and LLE DVT. PMH: chronic pain, back surgeries, DVT, PE, DM, CKD, R BKA, HR, gout, hyperhalemia, AKI, chronic hydronephrosis. ? ?  ?PT Comments  ? ? Patient  resting in bed with head covered by blanket and required verbal/tactile cues to alert patient. Once awake pt c/o back/neck pain but agreeable to mobilize OOB. Close min guard for safety with transfers and gait as pt is slightly impulsive and has poor safety awareness for walker management. Pt is resistant to cues from therapist for safety and closed eyes avoiding eye contact while therapist discussed self care for health, wellness, and safety when pt returns home. Acute PT will follow up as able. ? ?  ?Recommendations for follow up therapy are one component of a multi-disciplinary discharge planning process, led by the attending physician.  Recommendations may be updated based on patient status, additional functional criteria and insurance authorization. ? ?Follow Up Recommendations ? Home health PT ?  ?  ?Assistance Recommended at Discharge Intermittent Supervision/Assistance  ?Patient can return home with the following A little help with walking and/or transfers;A little help with bathing/dressing/bathroom;Assist for transportation;Help with stairs or ramp for entrance ?  ?Equipment Recommendations ? None recommended by PT  ?  ?Recommendations for Other Services   ? ? ?  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 Also wears boot on L foot (does not have in hospital room) ?Restrictions ?Weight Bearing Restrictions: No  ?  ? ?Mobility ? Bed Mobility ?Overal bed mobility: Modified Independent ?Bed  Mobility: Supine to Sit, Sit to Supine ?  ?  ?Supine to sit: Modified independent (Device/Increase time) ?  ?  ?  ?  ? ?Transfers ?Overall transfer level: Needs assistance ?Equipment used: Rolling walker (2 wheels) ?Transfers: Sit to/from Stand ?Sit to Stand: Min guard, From elevated surface ?  ?  ?  ?  ?  ?General transfer comment: pt has very poor safety awareness and is quick to stand with RW. guarding for safety. ?  ? ?Ambulation/Gait ?Ambulation/Gait assistance: Min guard ?Gait Distance (Feet): 50 Feet ?Assistive device: Rolling walker (2 wheels) ?Gait Pattern/deviations: Step-through pattern, Decreased stride length, Trunk flexed ?Gait velocity: decreased ?  ?  ?General Gait Details: close guarding for safety. pt kept walker too far ahead and required frequent cues for safety. pt attempting to walk quickly and required cues for safe gait speed to reduce fall risk. pt resistant to all cues from therapist and insitant on "doing it his way". ? ? ?Stairs ?  ?  ?  ?  ?  ? ? ?Wheelchair Mobility ?  ? ?Modified Rankin (Stroke Patients Only) ?  ? ? ?  ?Balance Overall balance assessment: Needs assistance ?Sitting-balance support: Feet supported, No upper extremity supported ?Sitting balance-[name]: Good ?Sitting balance - Comments: pt able to don prosthesis at EOB, assist needed to prevent air pocket from forming at bottom of liner. pt resistant to help from therapist. ?  ?Standing balance support: During functional activity, Single extremity supported, Reliant on assistive device for balance ?Standing balance-[name]: Poor ?Standing balance comment: pt very unsteady, heavy reliance on RW. ?  ?  ?  ?  ?  ?  ?  ?  ?  ?  ?  ?  ? ?  ?  Cognition Arousal/Alertness: Awake/alert ?Behavior During Therapy: [facility] for tasks assessed/performed ?Overall Cognitive Status: Impaired/Different from baseline ?Area of Impairment: Safety/judgement, Problem solving, Following commands ?  ?  ?  ?  ?  ?  ?  ?  ?  ?  ?  ?   ?Safety/Judgement: Decreased awareness of safety, Decreased awareness of deficits ?  ?Problem Solving: Requires verbal cues ?  ?  ?  ? ?  ?Exercises   ? ?  ?General Comments   ?  ?  ? ?Pertinent Vitals/Pain Pain Assessment ?Pain Assessment: Faces ?Faces Pain Scale: Hurts little more ?Pain Location: back and neck ?Pain Descriptors / Indicators: Discomfort, Sore ?Pain Intervention(s): Limited activity within patient's tolerance, Monitored during session, Repositioned, Ice applied  ? ? ?Home Living   ?  ?  ?  ?  ?  ?  ?  ?  ?  ?   ?  ?Prior Function    ?  ?  ?   ? ?PT Goals (current goals can now be found in the care plan section) Acute Rehab PT Goals ?PT Goal Formulation: With patient ?Time For Goal Achievement: 01/14/22 ?Potential to Achieve Goals: Fair ?Progress towards PT goals: Progressing toward goals ? ?  ?Frequency ? ? ? Min 3X/week ? ? ? ?  ?PT Plan Current plan remains appropriate  ? ? ?Co-evaluation   ?  ?  ?  ?  ? ?  ?AM-PAC PT "6 Clicks" Mobility   ?Outcome Measure ? Help needed turning from your back to your side while in a flat bed without using bedrails?: None ?Help needed moving from lying on your back to sitting on the side of a flat bed without using bedrails?: None ?Help needed moving to and from a bed to a chair (including a wheelchair)?: A Little ?Help needed standing up from a chair using your arms (e.g., wheelchair or bedside chair)?: A Little ?Help needed to walk in hospital room?: A Little ?Help needed climbing 3-5 steps with a railing? : A Lot ?6 Click Score: 19 ? ?  ?End of Session Equipment Utilized During Treatment: Gait belt ?Activity Tolerance: Patient tolerated treatment well ?Patient left: in bed;with call bell/phone within reach;with bed alarm set ?Nurse Communication: Mobility status ?PT Visit Diagnosis: Muscle weakness (generalized) (M62.81);Difficulty in walking, not elsewhere classified (R26.2) ?  ? ? ?Time: 1610-9604 ?PT Time Calculation (min) (ACUTE ONLY): 26 min ? ?Charges:   $Gait Training: 8-22 mins ?$Therapeutic Activity: 8-22 mins          ?          ? ?Gwynneth Albright PT, DPT ?Acute Rehabilitation Services ?Office [phone] ?Pager [phone]  ? ? ?Jacques Navy ?01/03/2022, 4:03 PM ? ?

## 2022-01-03 NOTE — Care Management Important Message (Signed)
Important Message ? ?Patient Details IM Letter given to the Patient. ?Name: Thomas [name] ?MRN: [id number] ?Date of Birth: 02-Jul-1951 ? ? ?Medicare Important Message Given:  Yes ? ? ? ? ?Kerin Salen ?01/03/2022, 1:27 PM ?

## 2022-01-03 NOTE — Discharge Summary (Signed)
?Physician Discharge Summary ?  ?Patient: Thomas [name] MRN: [id number] DOB: [birth year]  ?Admit date:     12/30/2021  ?Discharge date: 01/03/22  ?Discharge Physician: Nita Sells  ? ?PCP: Antonietta Jewel, MD  ? ?Recommendations at discharge:  ? ?Patient will need outpatient follow-up closely with his nephrologist and primary care physician ?Patient requires renal panel but really requires a discussion about goals of care in the outpatient setting as he refuses dialysis but also does not really wish to be DNR ?Home health has been engaged with him ? ?Discharge Diagnoses: ?Principal Problem: ?  Multifocal pneumonia ?Active Problems: ?  Acute renal failure superimposed on chronic kidney disease ([place]) ?  Left leg cellulitis ?  UTI (urinary tract infection) ?  Chronic combined systolic and diastolic congestive heart failure ([place]) ?  HTN (hypertension) ?  [name], type 2) ([place]) ?  [name], right ([place]) ?  Chronic anticoagulation ?  Prolonged QT interval ? ?Resolved Problems: ?  * No resolved hospital problems. * ? ?Hospital Course: ? 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 Gloriann Loan ?GI bleed coffee-ground emesis 11/2019 Dr. 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 ? ?He has had several subsequent admissions 2/18-2/26 2023 with a new DVT and lower extremity cellulitis ?I took care of him during hospitalization 3/2 through 3/7 when he had hyperkalemia and peaking T waves and nephrology saw him and he was started on various meds ?He  was re-admitted yet again after wound care came to his house and he had a small piece of skin tear off the area which was bleeding ?It looked cellulitic ?He was found to have on chest x-ray a query of pneumonia and intermittent nausea which she has had before ? ?Assessment and Plan: ? ?Cellulitis/infection lower extremity-recurrent ?Initially he was started on broad-spectrum antibiotics which were narrowed multiple times to Rocephin/doxycycline and then narrowed to [facility] monotherapy 3/16 ?See pictures below attached to chart ??  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 Gloriann Loan ?Patient does not want dialysis and had meds adjusted by his outpatient nephrologist in [facility] ?He is at high risk for readmission ?Can continue his usual finasteride 5, tamsulosin 0.4  ?I have made some adjustments to his MAR on discharge for his blood pressure meds etc. ?Combined systolic diastolic heart failure EF depressed 35% range 09/2021 ?He is currently at 120 KG and was discharged at 116 kg--- ?Stop IV fluid-give 1 dose of Lasix 40 mg today ?Continue metoprolol 50 twice daily, amlodipine 5 ?Prior DVTs and PEs in the past ?On discharge she will go home on 2.5 mg of Coumadin as his INR was slightly supratherapeutic ?  ?Very high risk for readmission-continues to refuse hospice and continues to refuse  dialysis-I have discussed this case with TOC and I expect he will be readmitted within the next 30 to 60 days ?  ? ? ?  ? ? ?Consultants: None ?Procedures performed:no ?Disposition: Home health ?Diet recommendation:  ?Regular diet ?DISCHARGE  MEDICATION: ?Allergies as of 01/03/2022   ? ?   Reactions  ? Other Other (See Comments)  ? Blood pressure issues ?Per [facility] hospital: Pt states he can only take these pain meds or else he gets very sick: Oxycontin,morphine,demerol, and dilaudid are the only pain meds pt states he can take.   ? Oxymorphone Other (See Comments)  ? Causes kidney problems  ? Aspirin Nausea And Vomiting  ? Per [facility], pt does not know reaction   ? Beta Vulgaris Nausea And Vomiting  ? Buspirone Other (See Comments)  ? Affected his head  ? Cabbage Nausea And Vomiting  ? Codeine Other (See Comments)  ? Pt does not remember the reaction but knows he can not take  ? Diflunisal Nausea And Vomiting, Other (See Comments)  ? Fish Allergy Nausea And Vomiting  ? Fish-derived Products Nausea And Vomiting  ? Hydroxyzine Nausea And Vomiting  ? Methadone Other (See Comments)  ? Made him loopy  ? Pentazocine Other (See Comments)  ? Pt does not remember the reaction   ? Propoxyphene Other (See Comments)  ? Pt does not remember the reaction  ? Shellfish Allergy Nausea And Vomiting  ? Sulfa Antibiotics Hives  ? Sulfasalazine Other (See Comments)  ? Pt does not remember the reaction  ? Amoxicillin Nausea And Vomiting, Rash  ? Per [facility]  ? ?  ? ?  ?Medication List  ?  ? ?STOP taking these medications   ? ?allopurinol 100 MG tablet ?Commonly known as: ZYLOPRIM ?  ?atorvastatin 80 MG tablet ?Commonly known as: LIPITOR ?  ?B-12 PO ?  ?colchicine 0.6 MG tablet ?  ?furosemide 40 MG tablet ?Commonly known as: LASIX ?  ?glipiZIDE 10 MG 24 hr tablet ?Commonly known as: GLUCOTROL XL ?  ?MAGNESIUM PO ?  ? ?  ? ?TAKE these medications   ? ?bisacodyl 10 MG suppository ?Commonly known as: DULCOLAX ?Place 10 mg rectally daily. ?  ?cefdinir 300 MG capsule ?Commonly known as: OMNICEF ?Take 1 capsule (300 mg total) by mouth every 12 (twelve) hours. ?  ?cloNIDine 0.1 MG tablet ?Commonly known as: CATAPRES ?Take 0.1 mg by mouth 3 (three) times daily. ?   ?ferrous sulfate 325 (65 FE) MG tablet ?Take 1 tablet (325 mg total) by mouth daily with breakfast. ?  ?finasteride 5 MG tablet ?Commonly known as: PROSCAR ?Take 1 tablet (5 mg total) by mouth daily. ?  ?gabapentin 100 MG capsule ?Commonly known as: NEURONTIN ?Take 1 capsule (100 mg total) by mouth 3 (three) times daily. ?  ?lactulose 10 GM/15ML solution ?Commonly known as: Monserrate ?Take by mouth 2 (two) times daily as needed (constipation). ?  ?levothyroxine 50 MCG tablet ?Commonly known as: SYNTHROID ?Take 50 mcg by mouth daily before breakfast. ?  ?metoprolol tartrate 50 MG tablet ?Commonly known as: LOPRESSOR ?Take 1 tablet (50 mg total) by mouth 2 (two) times daily. ?  ?naloxegol oxalate 25 MG Tabs tablet ?Commonly known as: MOVANTIK ?Take 25 mg by mouth daily. ?  ?OxyCONTIN 30 MG 12 hr tablet ?Generic drug: oxyCODONE ?Take 30 mg by mouth 3 (three) times daily. ?What changed: Another medication with the same name was removed. Continue taking this medication, and follow the directions you see here. ?  ?  pantoprazole 40 MG tablet ?Commonly known as: PROTONIX ?Take 1 tablet by mouth 2 times daily. ?What changed:  ?when to take this ?reasons to take this ?  ?polyethylene glycol 17 g packet ?Commonly known as: MIRALAX / GLYCOLAX ?Take 17 g by mouth daily. ?Start taking on: January 04, 2022 ?  ?senna-docusate 8.6-50 MG tablet ?Commonly known as: Senokot-S ?Take 1 tablet by mouth 2 (two) times daily for 7 days. ?  ?sodium zirconium cyclosilicate 10 g Pack packet ?Commonly known as: LOKELMA ?Take 10 g by mouth daily. ?  ?tamsulosin 0.4 MG Caps capsule ?Commonly known as: FLOMAX ?Take 1 capsule (0.4 mg total) by mouth daily. ?  ?tiZANidine 4 MG tablet ?Commonly known as: ZANAFLEX ?Take 4 mg by mouth every 8 (eight) hours as needed for muscle spasms. ?  ?warfarin 5 MG tablet ?Commonly known as: COUMADIN ?Take 0.5 tablets (2.5 mg total) by mouth daily. ?What changed: how much to take ?  ? ?  ? ? Follow-up Information    ? ? [place]. Follow up.   ?Why: [facility] with provide home health nursing,  physical and occupational therapies. ?Contact information: ?[address] ?[place] [address] ?[phone]

## 2022-01-03 NOTE — Progress Notes (Signed)
Pt to be discharged to home this evening. Pt given discharge teaching including all discharge Medications and schedules for these Medications. Pt verbalized understanding of all discharge teaching. Discharge AVS with patient at discharge  ?

## 2022-01-03 NOTE — Progress Notes (Signed)
ANTICOAGULATION CONSULT NOTE - follow up ? ?Pharmacy Consult for Coumadin ?Indication:  hx VTE ? ?Allergies  ?Allergen Reactions  ? Other Other (See Comments)  ?  Blood pressure issues ?Per [facility] hospital: Pt states he can only take these pain meds or else he gets very sick: Oxycontin,morphine,demerol, and dilaudid are the only pain meds pt states he can take.  ?  ? Oxymorphone Other (See Comments)  ?  Causes kidney problems  ? Aspirin Nausea And Vomiting  ?  Per [facility], pt does not know reaction   ? Beta Vulgaris Nausea And Vomiting  ? Buspirone Other (See Comments)  ?  Affected his head ?  ? Cabbage Nausea And Vomiting  ? Codeine Other (See Comments)  ?  Pt does not remember the reaction but knows he can not take ?  ? Diflunisal Nausea And Vomiting and Other (See Comments)  ? Fish Allergy Nausea And Vomiting  ? Fish-Derived Products Nausea And Vomiting  ? Hydroxyzine Nausea And Vomiting  ? Methadone Other (See Comments)  ?  Made him loopy  ? Pentazocine Other (See Comments)  ?  Pt does not remember the reaction   ? Propoxyphene Other (See Comments)  ?  Pt does not remember the reaction ? ?  ? Shellfish Allergy Nausea And Vomiting  ? Sulfa Antibiotics Hives  ? Sulfasalazine Other (See Comments)  ?  Pt does not remember the reaction  ? Amoxicillin Nausea And Vomiting and Rash  ?  Per [facility]  ? ? ?Patient Measurements: ?Height: 6\' 5"  (195.6 cm) ?Weight: 117.5 kg (259 lb 0.7 oz) ?IBW/kg (Calculated) : 89.1 ? ?Vital Signs: ?Temp: 98.1 ?F (36.7 ?C) (03/17 0400) ?BP: 158/87 (03/17 5170) ?Pulse Rate: 79 (03/17 0647) ? ?Labs: ?Recent Labs  ?  01/01/22 ?0332 01/02/22 ?0336 01/03/22 ?0345  ?HGB 8.5* 9.2* 9.8*  ?HCT 26.7* 28.8* 29.8*  ?PLT 161 169 180  ?LABPROT 30.0* 24.4* 24.9*  ?INR 2.9* 2.2* 2.3*  ?CREATININE 3.19* 2.72* 2.30*  ? ? ? ?Estimated Creatinine Clearance: 43.7 mL/min (A) (by C-G formula based on SCr of 2.3 mg/dL (H)). ? ?Home warfarin dose per med rec ?Warfarin 7.5mg  day, last dose past  week ? ?Assessment: ?71 yo M on warfarin PTA for hx DVT/PE, continued inpatient ? ?Today, 01/03/2022: ?INR remains therapeutic ?No active bleeding noted ?Hgb low but stable; Plt stable WNL   ?On Rocephin/doxy for cellulits v. PNA v. UTI which could cause elevated INR ?Diet ordered,eating 100% now ? ?Goal of Therapy:  ?INR 2-3 ?  ?Plan:  ?Repeat warfarin 7.5 mg PO x 1 today - pt was supratherapeutic on admission even after holding warfarin for almost 1 week, but suspect this was driven by reduced PO intake, which seems much improved now per RN ?Daily PT/INR ?Until warfarin INR trend becomes more evident, recommend discharging on 5 mg daily with INR recheck in 3-5 days ? ? ?Adrian Saran, PharmD, BCPS ?Secure Chat if ?s ?01/03/2022 8:26 AM ? ? ? ? ?

## 2022-01-04 LAB — CULTURE, BLOOD (ROUTINE X 2)
Culture: NO GROWTH
Culture: NO GROWTH
Special Requests: ADEQUATE
Special Requests: ADEQUATE

## 2022-01-07 ENCOUNTER — Inpatient Hospital Stay: Payer: Medicare Other

## 2022-01-07 ENCOUNTER — Inpatient Hospital Stay: Payer: Medicare Other | Attending: Physician Assistant | Admitting: Physician Assistant

## 2022-01-13 ENCOUNTER — Other Ambulatory Visit: Payer: Self-pay

## 2022-01-13 ENCOUNTER — Emergency Department (HOSPITAL_COMMUNITY): Payer: Medicare Other

## 2022-01-13 ENCOUNTER — Encounter (HOSPITAL_COMMUNITY): Payer: Self-pay | Admitting: Emergency Medicine

## 2022-01-13 ENCOUNTER — Inpatient Hospital Stay (HOSPITAL_COMMUNITY)
Admission: EM | Admit: 2022-01-13 | Discharge: 2022-01-18 | DRG: 683 | Disposition: A | Discharge status: Home Health | Payer: Medicare Other | Attending: Internal Medicine | Admitting: Internal Medicine

## 2022-01-13 DIAGNOSIS — T426X1A Poisoning by other antiepileptic and sedative-hypnotic drugs, accidental (unintentional), initial encounter: Secondary | ICD-10-CM | POA: Diagnosis present

## 2022-01-13 DIAGNOSIS — R079 Chest pain, unspecified: Secondary | ICD-10-CM | POA: Diagnosis present

## 2022-01-13 DIAGNOSIS — D649 Anemia, unspecified: Secondary | ICD-10-CM

## 2022-01-13 DIAGNOSIS — N401 Enlarged prostate with lower urinary tract symptoms: Secondary | ICD-10-CM | POA: Diagnosis present

## 2022-01-13 DIAGNOSIS — I5042 Chronic combined systolic (congestive) and diastolic (congestive) heart failure: Secondary | ICD-10-CM | POA: Diagnosis present

## 2022-01-13 DIAGNOSIS — N179 Acute kidney failure, unspecified: Principal | ICD-10-CM | POA: Diagnosis present

## 2022-01-13 DIAGNOSIS — Z881 Allergy status to other antibiotic agents status: Secondary | ICD-10-CM

## 2022-01-13 DIAGNOSIS — Z89511 Acquired absence of right leg below knee: Secondary | ICD-10-CM

## 2022-01-13 DIAGNOSIS — G894 Chronic pain syndrome: Secondary | ICD-10-CM | POA: Diagnosis present

## 2022-01-13 DIAGNOSIS — Z79890 Hormone replacement therapy (postmenopausal): Secondary | ICD-10-CM | POA: Diagnosis not present

## 2022-01-13 DIAGNOSIS — E875 Hyperkalemia: Secondary | ICD-10-CM | POA: Diagnosis present

## 2022-01-13 DIAGNOSIS — Z79899 Other long term (current) drug therapy: Secondary | ICD-10-CM

## 2022-01-13 DIAGNOSIS — Z91013 Allergy to seafood: Secondary | ICD-10-CM

## 2022-01-13 DIAGNOSIS — Z7901 Long term (current) use of anticoagulants: Secondary | ICD-10-CM

## 2022-01-13 DIAGNOSIS — E039 Hypothyroidism, unspecified: Secondary | ICD-10-CM | POA: Diagnosis present

## 2022-01-13 DIAGNOSIS — T426X5A Adverse effect of other antiepileptic and sedative-hypnotic drugs, initial encounter: Secondary | ICD-10-CM | POA: Diagnosis present

## 2022-01-13 DIAGNOSIS — I824Y2 Acute embolism and thrombosis of unspecified deep veins of left proximal lower extremity: Secondary | ICD-10-CM | POA: Diagnosis not present

## 2022-01-13 DIAGNOSIS — Z8249 Family history of ischemic heart disease and other diseases of the circulatory system: Secondary | ICD-10-CM | POA: Diagnosis not present

## 2022-01-13 DIAGNOSIS — M1990 Unspecified osteoarthritis, unspecified site: Secondary | ICD-10-CM | POA: Diagnosis present

## 2022-01-13 DIAGNOSIS — Z809 Family history of malignant neoplasm, unspecified: Secondary | ICD-10-CM

## 2022-01-13 DIAGNOSIS — Z882 Allergy status to sulfonamides status: Secondary | ICD-10-CM

## 2022-01-13 DIAGNOSIS — E119 Type 2 diabetes mellitus without complications: Secondary | ICD-10-CM | POA: Diagnosis present

## 2022-01-13 DIAGNOSIS — K219 Gastro-esophageal reflux disease without esophagitis: Secondary | ICD-10-CM | POA: Diagnosis present

## 2022-01-13 DIAGNOSIS — I130 Hypertensive heart and chronic kidney disease with heart failure and stage 1 through stage 4 chronic kidney disease, or unspecified chronic kidney disease: Secondary | ICD-10-CM | POA: Diagnosis present

## 2022-01-13 DIAGNOSIS — Z888 Allergy status to other drugs, medicaments and biological substances status: Secondary | ICD-10-CM | POA: Diagnosis not present

## 2022-01-13 DIAGNOSIS — I10 Essential (primary) hypertension: Secondary | ICD-10-CM | POA: Diagnosis present

## 2022-01-13 DIAGNOSIS — S91302A Unspecified open wound, left foot, initial encounter: Secondary | ICD-10-CM | POA: Diagnosis present

## 2022-01-13 DIAGNOSIS — Z885 Allergy status to narcotic agent status: Secondary | ICD-10-CM

## 2022-01-13 DIAGNOSIS — D631 Anemia in chronic kidney disease: Secondary | ICD-10-CM | POA: Diagnosis present

## 2022-01-13 DIAGNOSIS — S91332A Puncture wound without foreign body, left foot, initial encounter: Secondary | ICD-10-CM | POA: Diagnosis present

## 2022-01-13 DIAGNOSIS — E1122 Type 2 diabetes mellitus with diabetic chronic kidney disease: Secondary | ICD-10-CM | POA: Diagnosis present

## 2022-01-13 DIAGNOSIS — N400 Enlarged prostate without lower urinary tract symptoms: Secondary | ICD-10-CM | POA: Diagnosis not present

## 2022-01-13 DIAGNOSIS — N184 Chronic kidney disease, stage 4 (severe): Secondary | ICD-10-CM | POA: Diagnosis present

## 2022-01-13 DIAGNOSIS — Z20822 Contact with and (suspected) exposure to covid-19: Secondary | ICD-10-CM | POA: Diagnosis present

## 2022-01-13 DIAGNOSIS — Z86718 Personal history of other venous thrombosis and embolism: Secondary | ICD-10-CM

## 2022-01-13 DIAGNOSIS — Z886 Allergy status to analgesic agent status: Secondary | ICD-10-CM

## 2022-01-13 DIAGNOSIS — I82402 Acute embolism and thrombosis of unspecified deep veins of left lower extremity: Secondary | ICD-10-CM | POA: Diagnosis present

## 2022-01-13 DIAGNOSIS — N138 Other obstructive and reflux uropathy: Secondary | ICD-10-CM | POA: Diagnosis present

## 2022-01-13 DIAGNOSIS — Z7984 Long term (current) use of oral hypoglycemic drugs: Secondary | ICD-10-CM

## 2022-01-13 DIAGNOSIS — I5043 Acute on chronic combined systolic (congestive) and diastolic (congestive) heart failure: Principal | ICD-10-CM

## 2022-01-13 LAB — CBC WITH DIFFERENTIAL/PLATELET
Abs Immature Granulocytes: 0.01 10*3/uL (ref 0.00–0.07)
Basophils Absolute: 0 10*3/uL (ref 0.0–0.1)
Basophils Relative: 0 %
Eosinophils Absolute: 0.3 10*3/uL (ref 0.0–0.5)
Eosinophils Relative: 6 %
HCT: 26.6 % — ABNORMAL LOW (ref 39.0–52.0)
Hemoglobin: 8.1 g/dL — ABNORMAL LOW (ref 13.0–17.0)
Immature Granulocytes: 0 %
Lymphocytes Relative: 20 %
Lymphs Abs: 1 10*3/uL (ref 0.7–4.0)
MCH: 31.6 pg (ref 26.0–34.0)
MCHC: 30.5 g/dL (ref 30.0–36.0)
MCV: 103.9 fL — ABNORMAL HIGH (ref 80.0–100.0)
Monocytes Absolute: 0.5 10*3/uL (ref 0.1–1.0)
Monocytes Relative: 9 %
Neutro Abs: 3.4 10*3/uL (ref 1.7–7.7)
Neutrophils Relative %: 65 %
Platelets: 205 10*3/uL (ref 150–400)
RBC: 2.56 MIL/uL — ABNORMAL LOW (ref 4.22–5.81)
RDW: 15.7 % — ABNORMAL HIGH (ref 11.5–15.5)
WBC: 5.3 10*3/uL (ref 4.0–10.5)
nRBC: 0 % (ref 0.0–0.2)

## 2022-01-13 LAB — TROPONIN I (HIGH SENSITIVITY)
Troponin I (High Sensitivity): 10 ng/L (ref <18)
Troponin I (High Sensitivity): 9 ng/L (ref <18)

## 2022-01-13 LAB — COMPREHENSIVE METABOLIC PANEL
ALT: 10 U/L (ref 0–44)
AST: 26 U/L (ref 15–41)
Albumin: 3.2 g/dL — ABNORMAL LOW (ref 3.5–5.0)
Alkaline Phosphatase: 75 U/L (ref 38–126)
Anion gap: 10 (ref 5–15)
BUN: 62 mg/dL — ABNORMAL HIGH (ref 8–23)
CO2: 23 mmol/L (ref 22–32)
Calcium: 6.4 mg/dL — CL (ref 8.9–10.3)
Chloride: 106 mmol/L (ref 98–111)
Creatinine, Ser: 3.88 mg/dL — ABNORMAL HIGH (ref 0.61–1.24)
GFR, Estimated: 16 mL/min — ABNORMAL LOW (ref >60)
Glucose, Bld: 90 mg/dL (ref 70–99)
Potassium: 5.4 mmol/L — ABNORMAL HIGH (ref 3.5–5.1)
Sodium: 139 mmol/L (ref 135–145)
Total Bilirubin: 0.7 mg/dL (ref 0.3–1.2)
Total Protein: 7.8 g/dL (ref 6.5–8.1)

## 2022-01-13 LAB — PROTIME-INR
INR: 8.7 (ref 0.8–1.2)
Prothrombin Time: 71.4 seconds — ABNORMAL HIGH (ref 11.4–15.2)

## 2022-01-13 LAB — MAGNESIUM: Magnesium: 1.8 mg/dL (ref 1.7–2.4)

## 2022-01-13 LAB — BRAIN NATRIURETIC PEPTIDE: B Natriuretic Peptide: 292.9 pg/mL — ABNORMAL HIGH (ref 0.0–100.0)

## 2022-01-13 IMAGING — CT CT HEAD W/O CM
4 series · 15 of 47 positions shown, 17 images · non-contrast
Comparison: [DATE]

CLINICAL DATA: Neck and chest pain, extremity weakness



[Series 3: head without · axial · non-contrast · 0.45mm/px · z∈[-855,-735]mm · 7 of 32 slices shown, 9 images]
[im 4/32  brain]
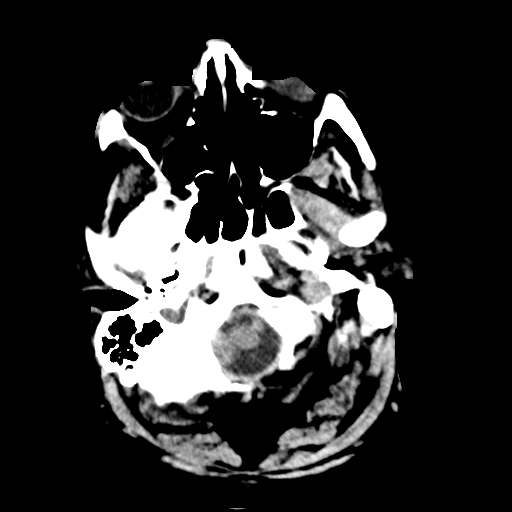
[im 4/32  bone]
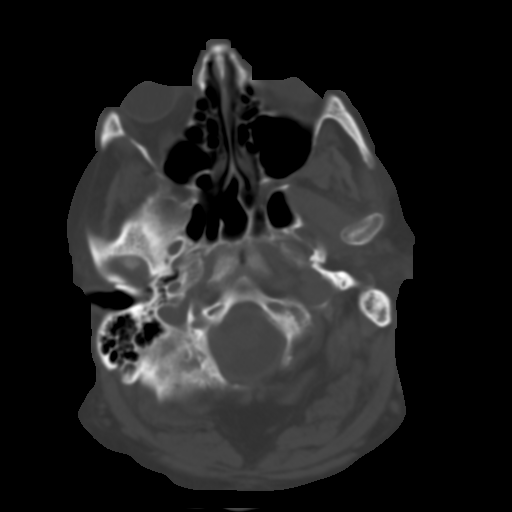
[im 8/32  brain]
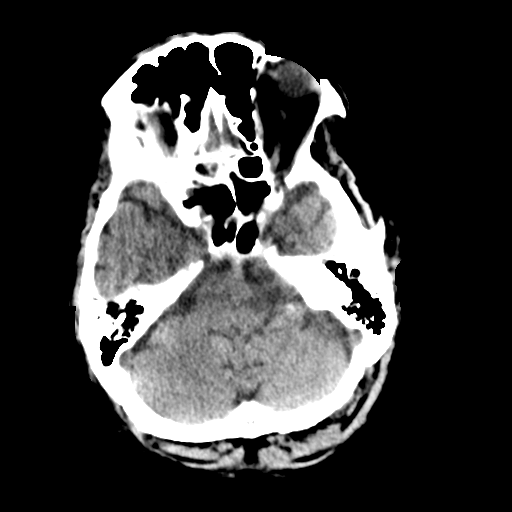
[im 12/32  brain]
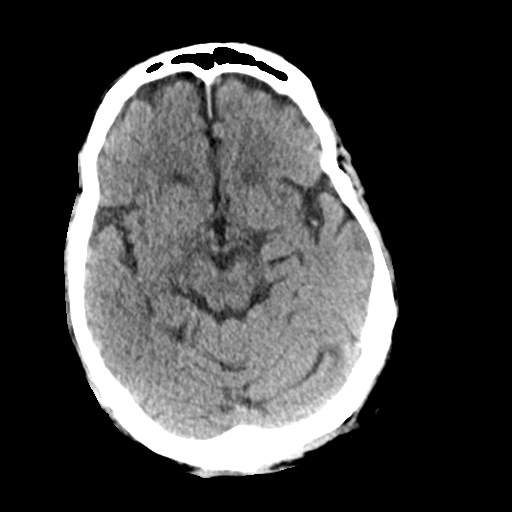
[im 16/32  brain]
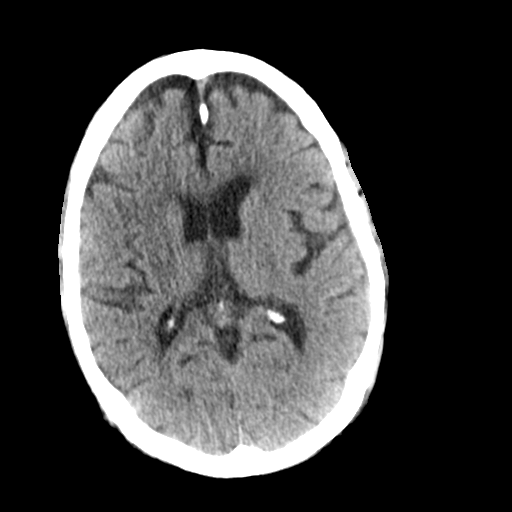
[im 20/32  brain]
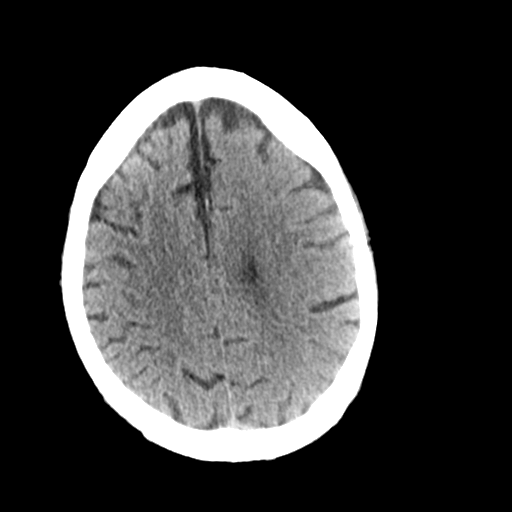
[im 20/32  bone]
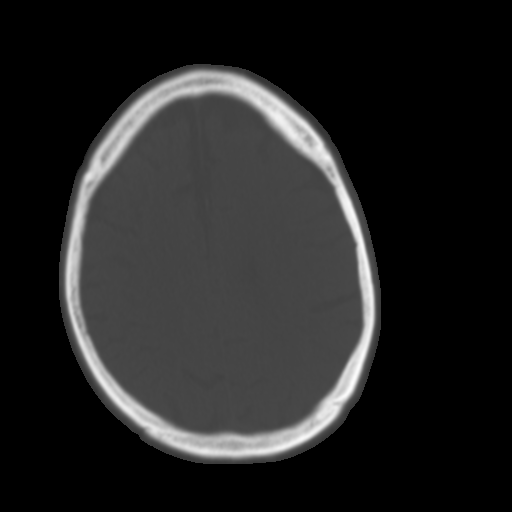
[im 24/32  brain]
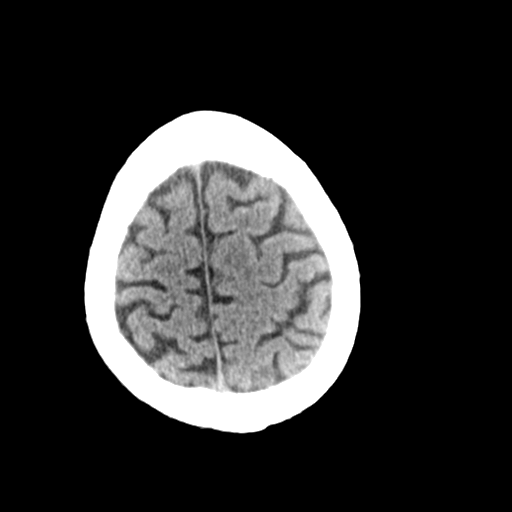
[im 28/32  brain]
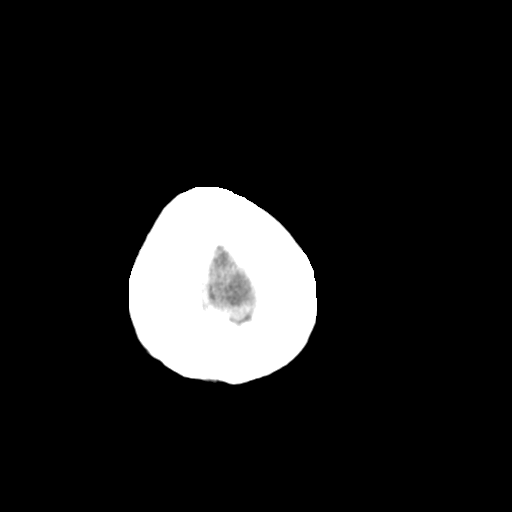

[Series 4: head bone · axial · 0.45mm/px · z∈[-856,-840]mm · 2 of 79 slices shown]
[im 8/79  bone]
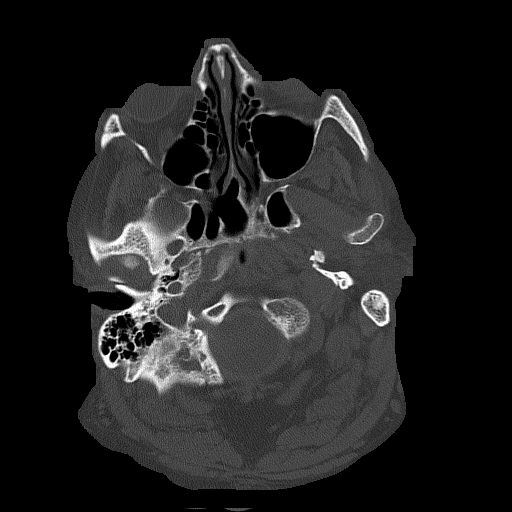
[im 16/79  bone]
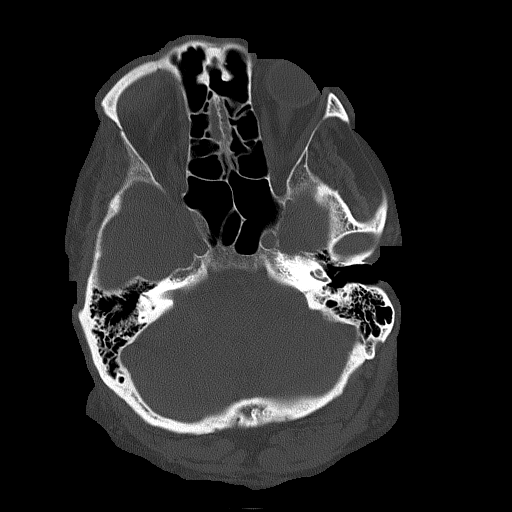

[Series 5: head without cor · coronal · non-contrast · 0.30mm/px · 3 of 76 slices shown]
[im 26/76  brain]
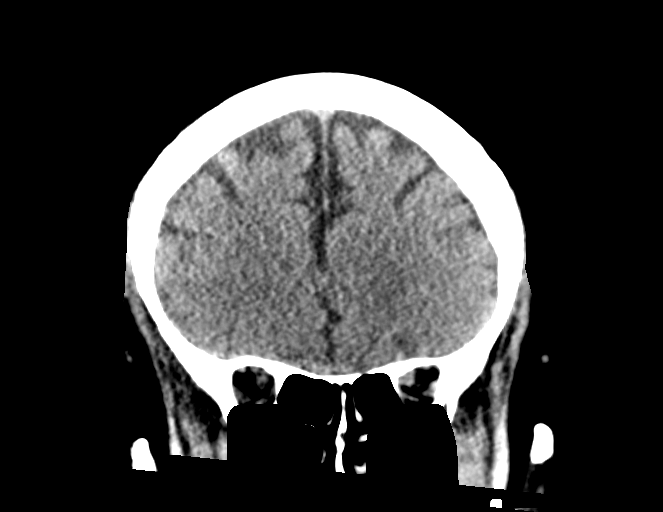
[im 34/76  brain]
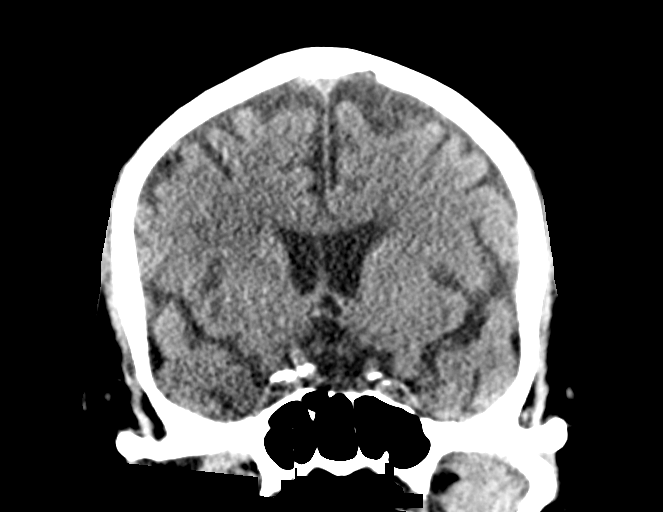
[im 42/76  brain]
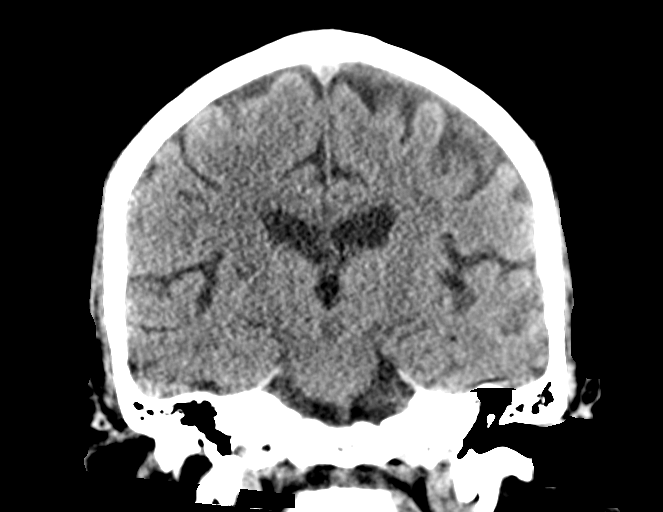

[Series 6: head without sag · sagittal · non-contrast · 0.30mm/px · 3 of 67 slices shown]
[im 23/67  brain]
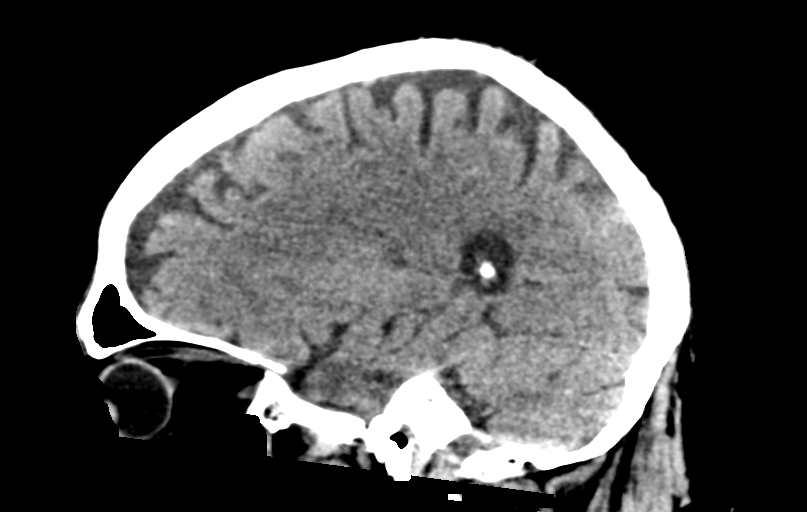
[im 34/67  brain]
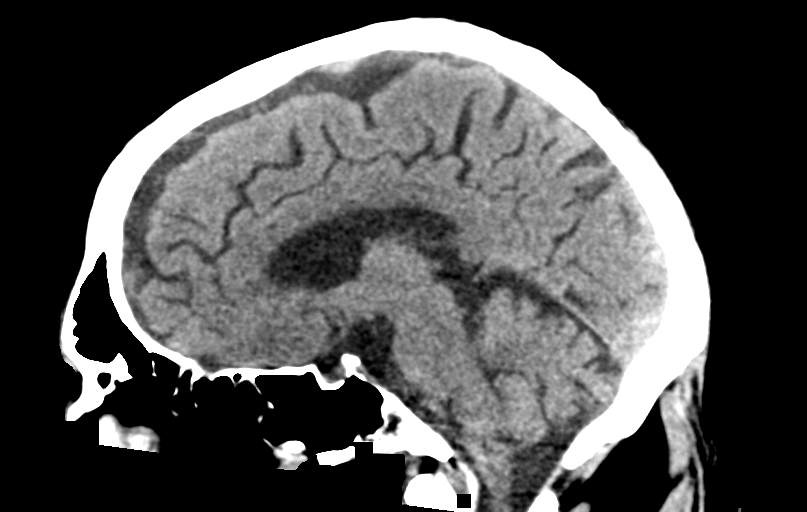
[im 44/67  brain]
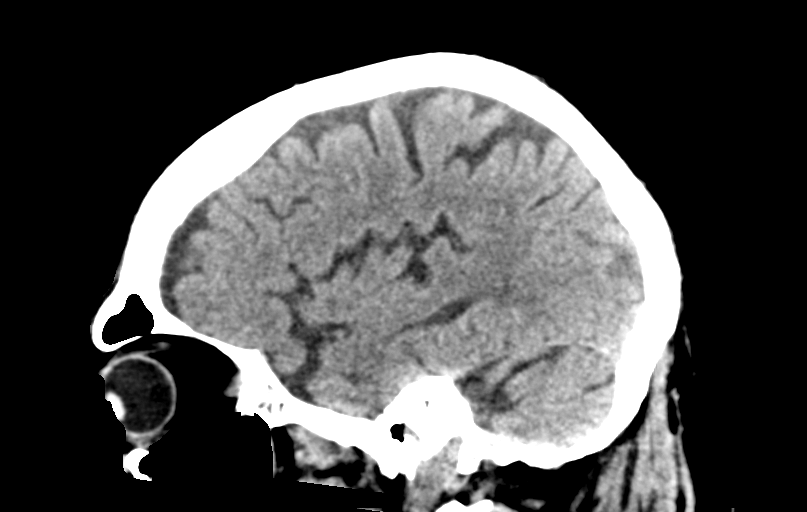

[15 of 47 positions shown; findings below may reference images not displayed]

FINDINGS: Brain: Chronic lacunar infarct right basal ganglia and right
cerebellar hemisphere. No acute infarct or hemorrhage. Lateral
ventricles and remaining midline structures are unremarkable. No
acute extra-axial fluid collections. No mass effect.

Vascular: No hyperdense vessel or unexpected calcification.

Skull: Normal. Negative for fracture or focal lesion.

Sinuses/Orbits: No acute finding.

Other: None.
IMPRESSION: 1. Stable head CT, no acute intracranial process.

## 2022-01-13 IMAGING — CR DG CHEST 2V
2 series · 2 of 2 positions shown · non-contrast
Comparison: [DATE]

CLINICAL DATA: Leg swelling and chest pain.

EXAM:
CHEST - 2 VIEW

[chest ap]
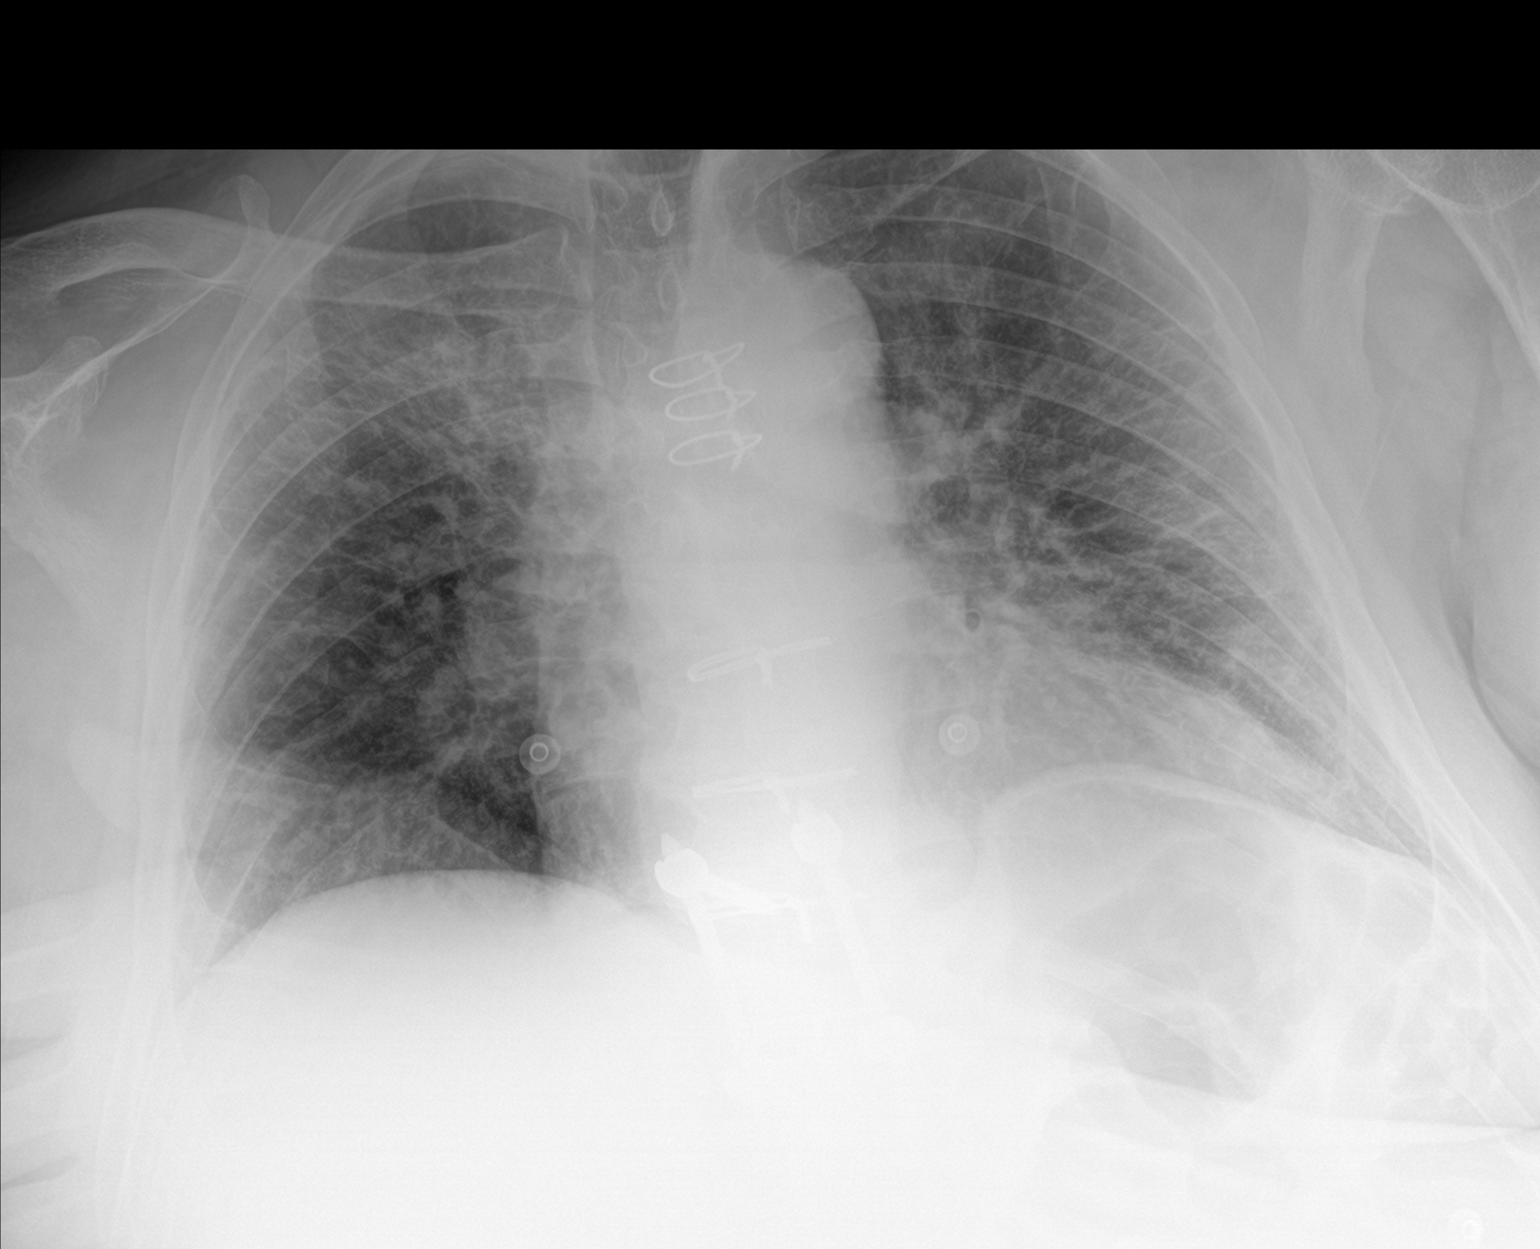

[chest lat]
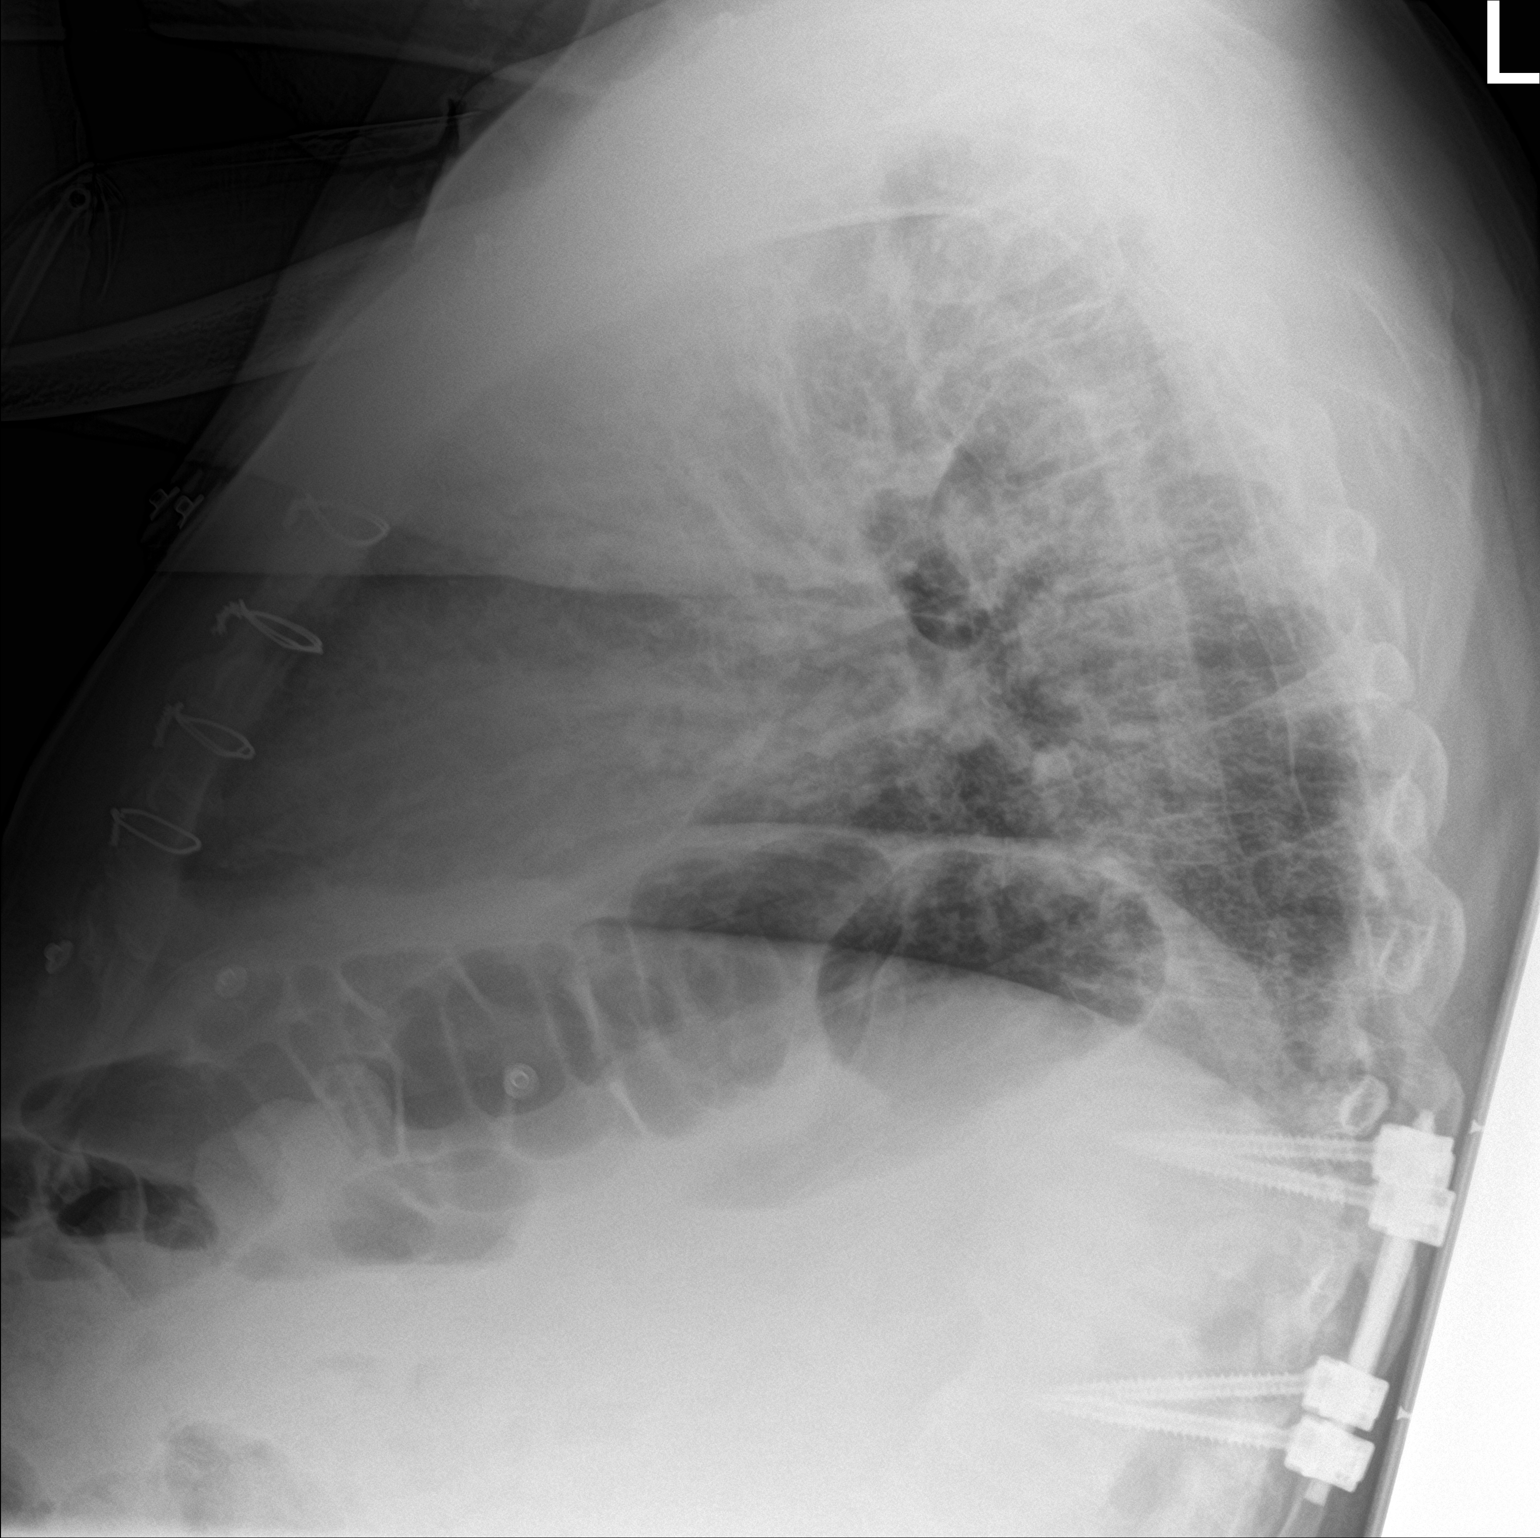

[2 of 2 positions shown; findings below may reference images not displayed]

FINDINGS: Multiple sternal wires are seen. Mild to moderate severity diffusely
increased interstitial lung markings are seen. This is increased in
severity when compared to the prior study. Mild superimposed right
upper lobe and left basilar atelectasis and/or infiltrate is again
seen. There is no evidence of a pleural effusion or pneumothorax.
The cardiac silhouette is borderline in size. Stable postoperative
changes are seen within the lower thoracic spine.
IMPRESSION: Mild to moderate severity interstitial edema with superimposed right
upper lobe and left basilar atelectasis and/or infiltrate.

## 2022-01-13 IMAGING — CT CT CERVICAL SPINE W/O CM
3 of 4 series · 11 of 33 positions shown, 13 images · non-contrast
Comparison: [DATE]

CLINICAL DATA: Chest and neck pain, extremity weakness



[Series 8: c_spine 2.0 sag bone · sagittal · 0.29mm/px · 5 of 61 slices shown, 6 images]
[im 21/61  bone]
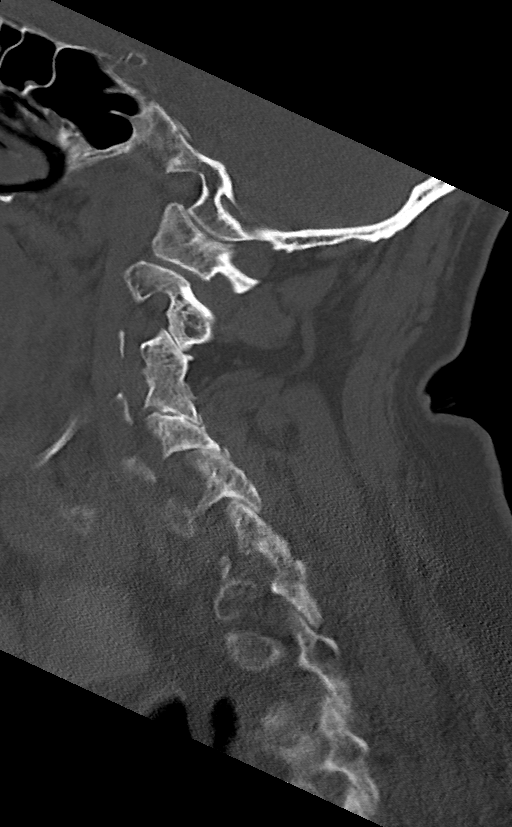
[im 26/61  bone]
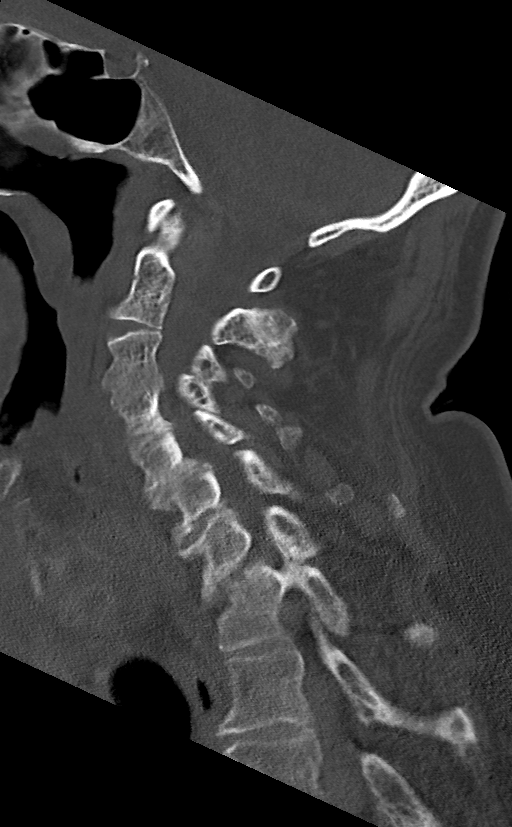
[im 31/61  soft-tissue]
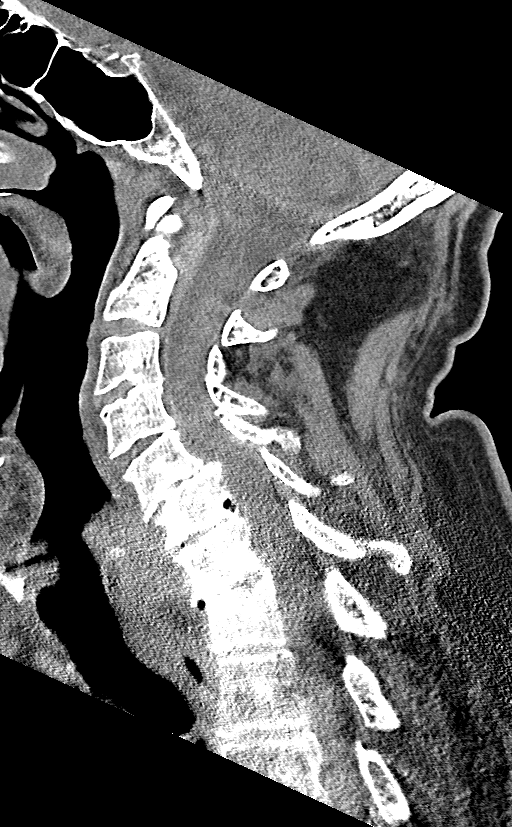
[im 31/61  bone]
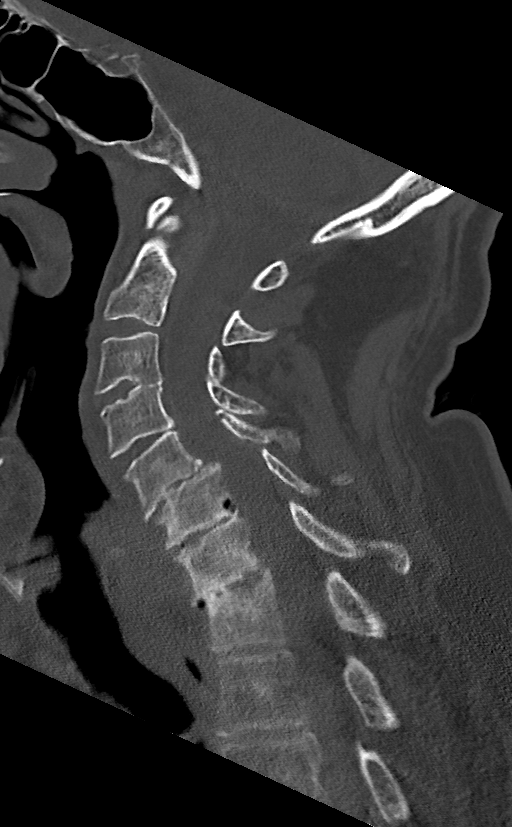
[im 36/61  bone]
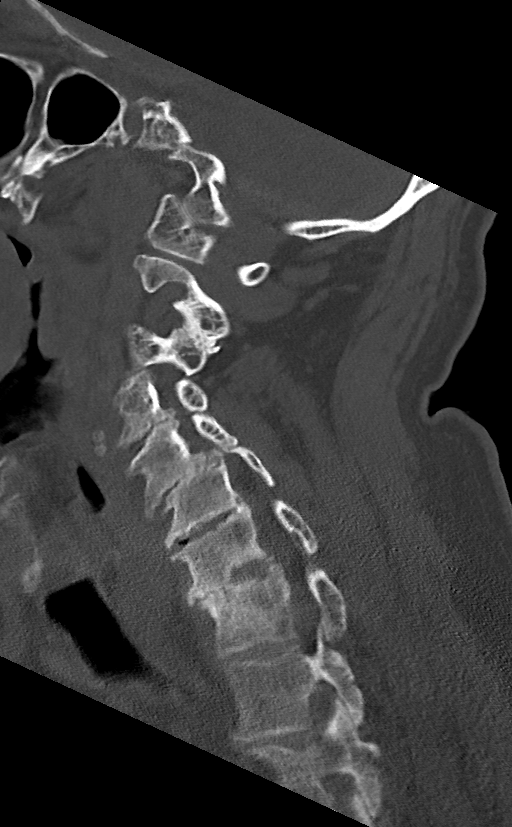
[im 41/61  bone]
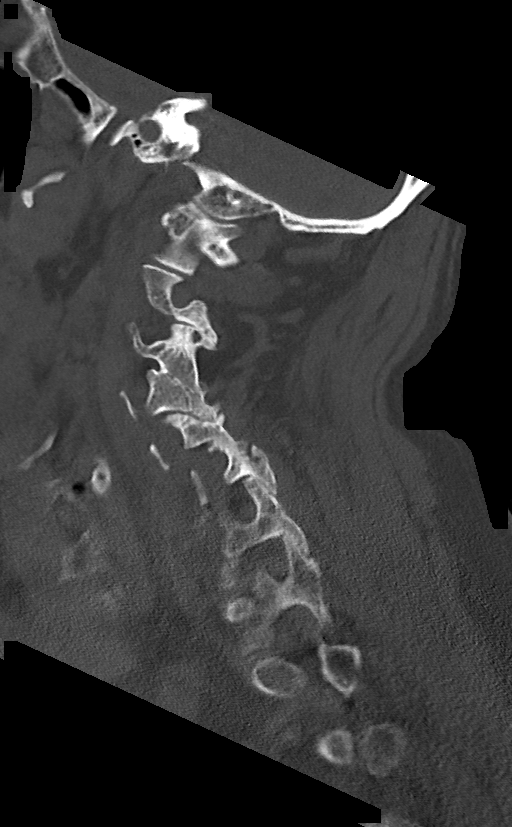

[Series 9: c_spine 2.0 cor bone · coronal · 0.26mm/px · 3 of 74 slices shown]
[im 24/74  bone]
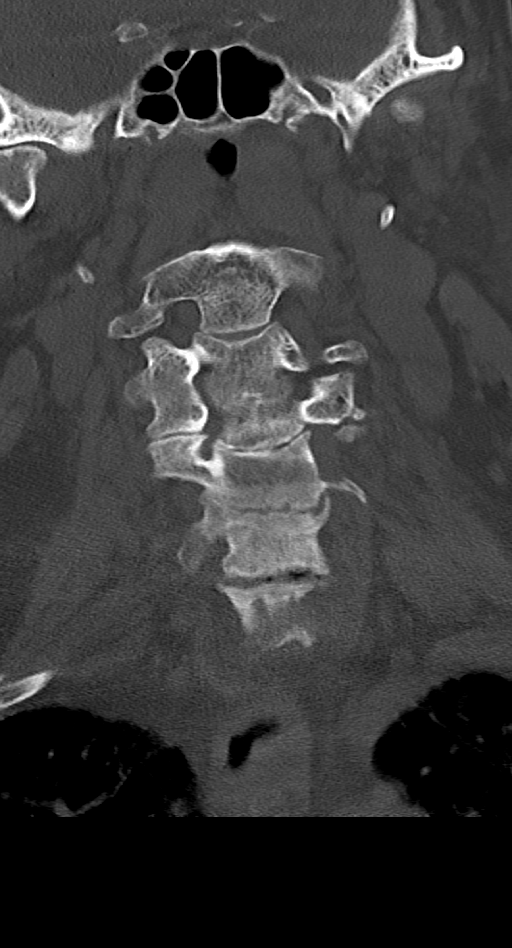
[im 33/74  bone]
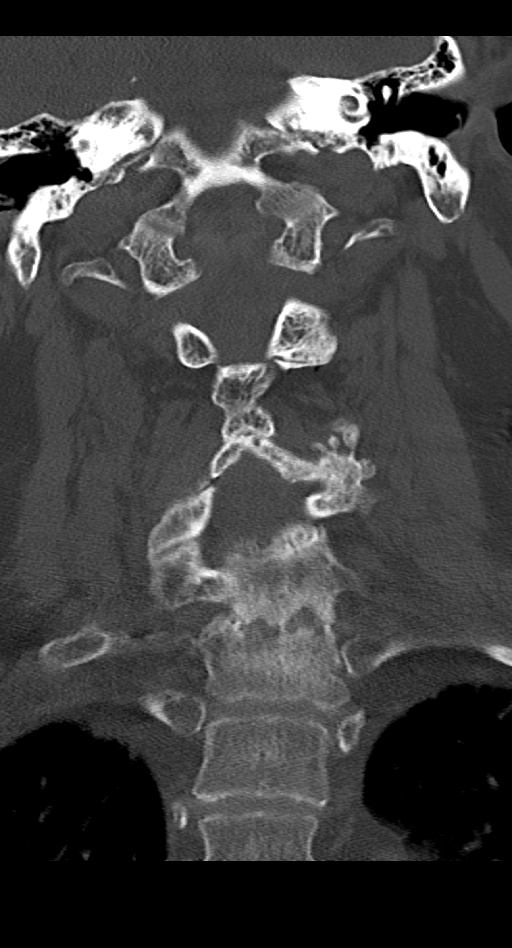
[im 42/74  bone]
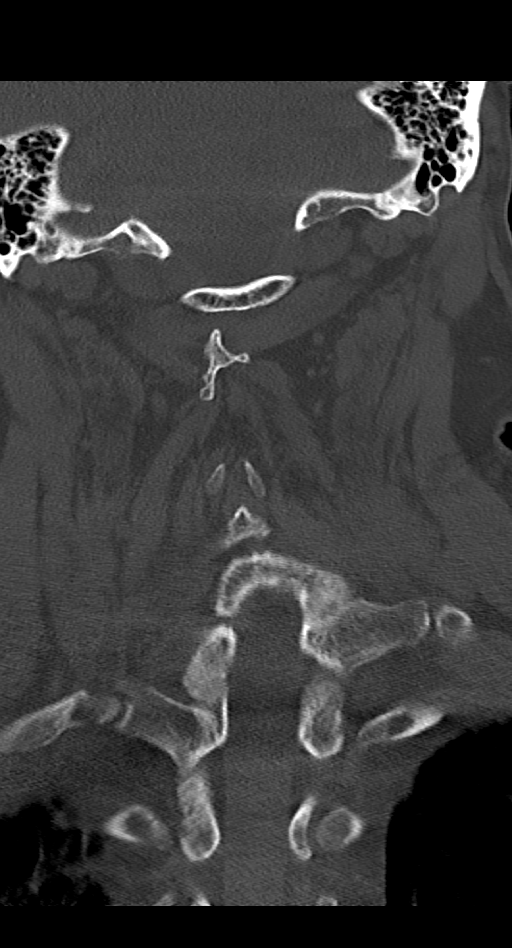

[Series 10: c_spine 2.0 orthogonals · oblique · 0.21mm/px · 3 of 122 slices shown, 4 images]
[im 35/122  soft-tissue]
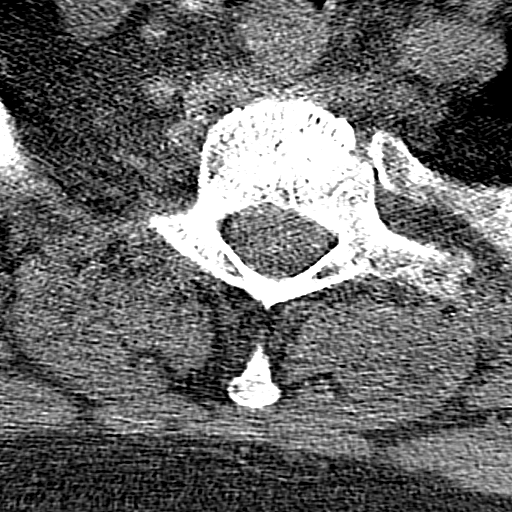
[im 35/122  bone]
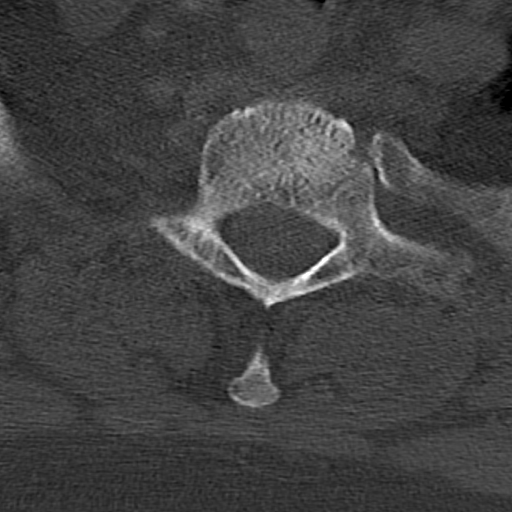
[im 70/122  bone]
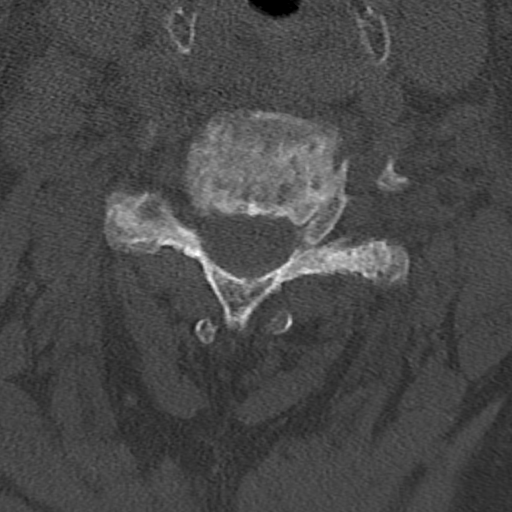
[im 104/122  bone]
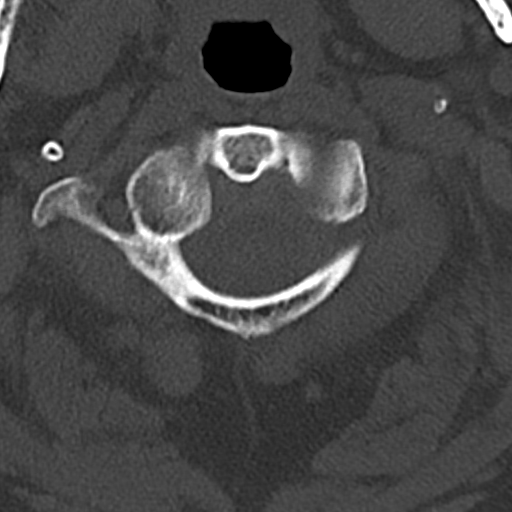

[11 of 33 positions shown; findings below may reference images not displayed]

FINDINGS: Alignment: Stable anterolisthesis of C5 on C6. Otherwise alignment
is anatomic.

Skull base and vertebrae: No acute fracture. No primary bone lesion
or focal pathologic process.

Soft tissues and spinal canal: No prevertebral fluid or swelling. No
visible canal hematoma.

Disc levels: Partial bony fusion across the C3-4 disc space. There
is severe spondylosis at C5-6, C6-7, and C7-T1, stable. Extensive
multilevel facet hypertrophy unchanged.

Upper chest: Airway is patent.  Lung apices are clear.

Other: Reconstructed images demonstrate no additional findings.
IMPRESSION: 1. No acute cervical spine fracture.
2. Stable multilevel spondylosis and facet hypertrophy.

## 2022-01-13 MED ORDER — PANTOPRAZOLE SODIUM 40 MG PO TBEC
40.0000 mg | DELAYED_RELEASE_TABLET | Freq: Two times a day (BID) | ORAL | Status: DC
  Administered 2022-01-14 – 2022-01-18 (×10): 40 mg via ORAL
  Filled 2022-01-13 (×10): qty 1

## 2022-01-13 MED ORDER — SODIUM ZIRCONIUM CYCLOSILICATE 10 G PO PACK
10.0000 g | PACK | Freq: Every day | ORAL | Status: DC
  Administered 2022-01-14 – 2022-01-15 (×2): 10 g via ORAL
  Filled 2022-01-13 (×3): qty 1

## 2022-01-13 MED ORDER — METOPROLOL TARTRATE 50 MG PO TABS
50.0000 mg | ORAL_TABLET | Freq: Two times a day (BID) | ORAL | Status: DC
  Administered 2022-01-14 – 2022-01-18 (×10): 50 mg via ORAL
  Filled 2022-01-13 (×3): qty 1
  Filled 2022-01-13: qty 2
  Filled 2022-01-13 (×2): qty 1
  Filled 2022-01-13: qty 2
  Filled 2022-01-13 (×3): qty 1

## 2022-01-13 MED ORDER — CLONIDINE HCL 0.1 MG PO TABS
0.1000 mg | ORAL_TABLET | Freq: Three times a day (TID) | ORAL | Status: DC
  Administered 2022-01-14 – 2022-01-18 (×15): 0.1 mg via ORAL
  Filled 2022-01-13 (×15): qty 1

## 2022-01-13 MED ORDER — INSULIN ASPART 100 UNIT/ML IJ SOLN
0.0000 [IU] | Freq: Three times a day (TID) | INTRAMUSCULAR | Status: DC
  Administered 2022-01-15 – 2022-01-16 (×2): 1 [IU] via SUBCUTANEOUS
  Administered 2022-01-16: 2 [IU] via SUBCUTANEOUS
  Administered 2022-01-17 – 2022-01-18 (×3): 1 [IU] via SUBCUTANEOUS

## 2022-01-13 MED ORDER — NALOXEGOL OXALATE 25 MG PO TABS
25.0000 mg | ORAL_TABLET | Freq: Every day | ORAL | Status: DC
  Administered 2022-01-15 – 2022-01-18 (×4): 25 mg via ORAL
  Filled 2022-01-13 (×5): qty 1

## 2022-01-13 MED ORDER — OXYCODONE HCL ER 10 MG PO T12A
30.0000 mg | EXTENDED_RELEASE_TABLET | Freq: Three times a day (TID) | ORAL | Status: DC
Start: 2022-01-14 — End: 2022-01-19
  Administered 2022-01-14 – 2022-01-18 (×15): 30 mg via ORAL
  Filled 2022-01-13 (×2): qty 3
  Filled 2022-01-13: qty 2
  Filled 2022-01-13: qty 3
  Filled 2022-01-13: qty 2
  Filled 2022-01-13: qty 3
  Filled 2022-01-13: qty 2
  Filled 2022-01-13 (×8): qty 3

## 2022-01-13 MED ORDER — POLYETHYLENE GLYCOL 3350 17 G PO PACK
17.0000 g | PACK | Freq: Every day | ORAL | Status: DC
  Administered 2022-01-14 – 2022-01-15 (×2): 17 g via ORAL
  Filled 2022-01-13 (×3): qty 1

## 2022-01-13 MED ORDER — TAMSULOSIN HCL 0.4 MG PO CAPS
0.4000 mg | ORAL_CAPSULE | Freq: Every day | ORAL | Status: DC
  Administered 2022-01-14 – 2022-01-18 (×5): 0.4 mg via ORAL
  Filled 2022-01-13 (×5): qty 1

## 2022-01-13 MED ORDER — CALCIUM GLUCONATE 10 % IV SOLN
1.0000 g | Freq: Once | INTRAVENOUS | Status: AC
  Administered 2022-01-13: 1 g via INTRAVENOUS
  Filled 2022-01-13: qty 10

## 2022-01-13 MED ORDER — FINASTERIDE 5 MG PO TABS
5.0000 mg | ORAL_TABLET | Freq: Every day | ORAL | Status: DC
  Administered 2022-01-14 – 2022-01-18 (×5): 5 mg via ORAL
  Filled 2022-01-13 (×5): qty 1

## 2022-01-13 MED ORDER — ACETAMINOPHEN 325 MG PO TABS
650.0000 mg | ORAL_TABLET | Freq: Four times a day (QID) | ORAL | Status: DC | PRN
Start: 2022-01-13 — End: 2022-01-19

## 2022-01-13 MED ORDER — FERROUS SULFATE 325 (65 FE) MG PO TABS
325.0000 mg | ORAL_TABLET | Freq: Every day | ORAL | Status: DC
  Administered 2022-01-14 – 2022-01-18 (×5): 325 mg via ORAL
  Filled 2022-01-13 (×5): qty 1

## 2022-01-13 MED ORDER — FUROSEMIDE 10 MG/ML IJ SOLN
40.0000 mg | Freq: Once | INTRAMUSCULAR | Status: DC
Start: 2022-01-13 — End: 2022-01-13

## 2022-01-13 MED ORDER — LEVOTHYROXINE SODIUM 50 MCG PO TABS
50.0000 ug | ORAL_TABLET | Freq: Every day | ORAL | Status: DC
  Administered 2022-01-14 – 2022-01-18 (×5): 50 ug via ORAL
  Filled 2022-01-13 (×2): qty 1
  Filled 2022-01-13: qty 2
  Filled 2022-01-13 (×2): qty 1

## 2022-01-13 MED ORDER — ACETAMINOPHEN 650 MG RE SUPP: 650.0000 mg | Freq: Four times a day (QID) | RECTAL | Status: DC | PRN

## 2022-01-13 MED ORDER — LACTULOSE 10 GM/15ML PO SOLN
10.0000 g | Freq: Two times a day (BID) | ORAL | Status: DC | PRN
  Filled 2022-01-13: qty 15

## 2022-01-13 NOTE — H&P (Signed)
?History and Physical  ? ? ?Patient: Thomas [name] MRN: [id number] [place]: 01/13/2022 ? ?Date of Service: the patient was seen and examined on 01/14/2022 ? ?Patient coming from: Home via EMS ? ?Chief Complaint:  ?Chief Complaint  ?Patient presents with  ? Chest Pain  ? ? ?HPI:  ? 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 ? ?Of note, patient was recently hospitalized at [facility] long hospital from 3/13 until 3/17.  Patient was felt to be suffering from left lower extremity cellulitis and multifocal pneumonia.  Patient was treated with intravenous antibiotics which was later transitioned to oral Omnicef therapy at time of discharge.   Patient also found to have acute kidney injury superimposed on his chronic kidney disease with creatinine peaking at 4.47 on arrival and downtrending to 2.3 at time of discharge.  Patient was discharged on 3/17. ? ?Patient explains that for the past few days since his recent discharge he has developed progressively worsening generalized weakness with increasing shortness of breath.  Patient is also complaining of associated left lower extremity edema.  Patient denies any fevers, sick contacts, recent travel or contact with confirmed COVID-19 infection. ? ?As patient's symptoms continue to worsen he has been also experiencing episodic chest discomfort.  Earlier in the day on 3/27, patient's home health nurse came to visit him to dress the wounds on his left  lower extremity when he suddenly began complaining of chest pain while sitting in his recliner.  Nurse contacted EMS who promptly came to evaluate the patient and brought him into [facility] emergency department for evaluation.  Patient declined aspirin in route. ? ?Upon evaluation in the emergency department patient was found once again found to have acute kidney injury superimposed on chronic kidney disease with creatinine of 3.88.  Patient was also noted to have mild hyperkalemia 5.7.  EKG revealed no dynamic ST segment change and troponin was unremarkable at 10.  Hospitalist group was called to assess the patient for admission to the hospital. ? ?Review of Systems: Review of Systems  ?Respiratory:  Positive for shortness of breath.   ?Cardiovascular:  Positive for chest pain.  ?All other systems reviewed and are negative. ? ? ?Past Medical History:  ?Diagnosis Date  ? Arthritis   ? BPH (benign prostatic hyperplasia) 11/24/2019  ? Chronic pain syndrome 11/24/2019  ? CKD (chronic kidney disease), stage IV ([place]) 11/24/2019  ? Diabetes mellitus without complication ([place])   ? Gout   ? Hypertension   ? Muscle pain   ? Physical deconditioning 11/24/2019  ? Scars   ? Swelling   ? ? ?Past Surgical History:  ?Procedure Laterality Date  ? BELOW KNEE LEG AMPUTATION Right   ? ESOPHAGOGASTRODUODENOSCOPY (EGD) WITH PROPOFOL N/A 11/25/2019  ? Procedure: ESOPHAGOGASTRODUODENOSCOPY (EGD) WITH PROPOFOL;  Surgeon: Carol Ada, MD;  Location: [place];  Service: Endoscopy;  Laterality: N/A;  ? IR FLUORO GUIDE CV LINE RIGHT  09/05/2019  ? IR LUMBAR DISC ASPIRATION W/IMG GUIDE  11/26/2019  ? IR US GUIDE VASC ACCESS RIGHT  09/05/2019  ? RADIOLOGY WITH  ANESTHESIA N/A 11/25/2019  ? Procedure: Thoracic and Lumbar MRI WITH ANESTHESIA;  Surgeon: Radiologist, Medication, MD;  Location: [place];  Service: Radiology;  Laterality: N/A;  ? ? ?Social History:  reports that he has never smoked. He has never been exposed to tobacco smoke. He has  never used smokeless tobacco. He reports that he does not drink alcohol and does not use drugs. ? ?Allergies  ?Allergen Reactions  ? Other Other (See Comments)  ?  Blood pressure issues ?Per [facility] hospital: Pt states he can only take these pain meds or else he gets very sick: Oxycontin,morphine,demerol, and dilaudid are the only pain meds pt states he can take.  ?  ? Oxymorphone Other (See Comments)  ?  Causes kidney problems  ? Aspirin Nausea And Vomiting  ?  Per [facility], pt does not know reaction   ? Beta Vulgaris Nausea And Vomiting  ? Buspirone Other (See Comments)  ?  Affected his head ?  ? Cabbage Nausea And Vomiting  ? Codeine Other (See Comments)  ?  Pt does not remember the reaction but knows he can not take ?  ? Diflunisal Nausea And Vomiting and Other (See Comments)  ? Fish Allergy Nausea And Vomiting  ? Fish-Derived Products Nausea And Vomiting  ? Hydroxyzine Nausea And Vomiting  ? Methadone Other (See Comments)  ?  Made him loopy  ? Pentazocine Other (See Comments)  ?  Pt does not remember the reaction   ? Propoxyphene Other (See Comments)  ?  Pt does not remember the reaction ? ?  ? Shellfish Allergy Nausea And Vomiting  ? Sulfa Antibiotics Hives  ? Sulfasalazine Other (See Comments)  ?  Pt does not remember the reaction  ? Amoxicillin Nausea And Vomiting and Rash  ?  Per [facility]  ? ? ?Family History  ?Problem Relation Age of Onset  ? Heart disease Mother   ? Cancer Father   ? ? ?Prior to Admission medications   ?Medication Sig Start Date End Date Taking? Authorizing Provider  ?bisacodyl (DULCOLAX) 10 MG suppository Place 10 mg rectally daily.    [provider]  ?cefdinir (OMNICEF) 300 MG capsule Take 1 capsule (300 mg total) by mouth every 12 (twelve) hours. 01/03/22   Nita Sells, MD  ?cloNIDine (CATAPRES) 0.1 MG tablet Take 0.1 mg by mouth 3 (three) times daily.    [provider]  ?ferrous sulfate 325 (65 FE) MG tablet Take 1 tablet (325 mg total)  by mouth daily with breakfast. 12/15/21 01/14/22  Antonieta Pert, MD  ?finasteride (PROSCAR) 5 MG tablet Take 1 tablet (5 mg total) by mouth daily. 10/10/19   Hosie Poisson, MD  ?gabapentin (NEURONTIN) 100 MG capsule Take 1 capsule (100 mg total) by mouth 3 (three) times daily. 12/24/21   Nita Sells, MD  ?lactulose (CHRONULAC) 10 GM/15ML solution Take by mouth 2 (two) times daily as needed (constipation).    [provider]  ?levothyroxine (SYNTHROID) 50 MCG tablet Take 50 mcg by mouth daily before breakfast.     [provider]  ?metoprolol tartrate (LOPRESSOR) 50 MG tablet Take 1 tablet (50 mg total) by mouth 2 (two) times daily. 12/24/21   Nita Sells, MD  ?naloxegol oxalate (MOVANTIK) 25 MG TABS tablet Take 25 mg by mouth daily.    [provider]  ?OXYCONTIN 30 MG 12 hr tablet Take 30 mg by mouth 3 (three) times daily. 12/30/21   [provider]  ?pantoprazole (PROTONIX) 40 MG tablet  Take 1 tablet by mouth 2 times daily. ?Patient taking differently: Take 40 mg by mouth 2 (two) times daily as needed (acid reflux). 10/01/21 02/22/22  Kayleen Memos, DO  ?polyethylene glycol (MIRALAX / GLYCOLAX) 17 g packet Take 17 g by mouth daily. 01/04/22   Nita Sells, MD  ?sodium zirconium cyclosilicate (LOKELMA) 10 g PACK packet Take 10 g by mouth daily. ?Patient not taking: Reported on 12/31/2021 12/24/21   Nita Sells, MD  ?tamsulosin (FLOMAX) 0.4 MG CAPS capsule Take 1 capsule (0.4 mg total) by mouth daily. 09/25/20   Jose Persia, MD  ?tiZANidine (ZANAFLEX) 4 MG tablet Take 4 mg by mouth every 8 (eight) hours as needed for muscle spasms. 11/26/21   [provider]  ?warfarin (COUMADIN) 5 MG tablet Take 0.5 tablets (2.5 mg total) by mouth daily. 01/03/22   Nita Sells, MD  ? ? ?Physical Exam: ? ?Vitals:  ? 01/14/22 0415 01/14/22 0430 01/14/22 0530 01/14/22 0600  ?BP: 135/81 134/73 134/76 (!) 153/82  ?Pulse: 61 (!) 57 (!) 57 (!) 59  ?Resp: 14 12 15 19    ?Temp:      ?TempSrc:      ?SpO2: 92% 95% 91% 92%  ?Weight:      ?Height:      ? ? ?Constitutional: Awake alert and oriented x3, patient is in distress due to chest and back pain that he states is due to t

## 2022-01-13 NOTE — ED Provider Triage Note (Signed)
Emergency Medicine Provider Triage Evaluation Note ? ?Thomas [name] , a 71 y.o. male  was evaluated in triage.  Pt complains of chest pain, neck pain, and weakness to bilateral upper and lower extremities.  Patient reports that chest pain started on Sunday.  Pain is located throughout the entire left side of his chest.  No associated nausea, vomiting, diaphoresis or shortness of breath.  Pain has been constant since starting. ? ?Patient states that he has had bilateral upper and lower extremity weakness and "random jerking," since waking Sunday morning.  Patient states that his head fell off a pillow in the night and when he woke he was having random movements of bilateral upper and lower extremities.  Patient states that he has had this sporadically since Sunday morning.  Patient also reports weakness of bilateral upper and lower extremities. ? ?Review of Systems  ?Positive: Chest pain, bilateral upper and lower extremity weakness, left lower leg swelling ?Negative: Shortness of breath, nausea, vomiting, diaphoresis, palpitations, visual disturbance, dysarthria, facial asymmetry, ? ?Physical Exam  ?BP 131/79 (BP Location: Left Arm)   Pulse 62   Temp 98.6 ?F (37 ?C) (Oral)   Resp 16   Ht 6\' 5"  (1.956 m)   Wt 117.5 kg   SpO2 96%   BMI 30.72 kg/m?  ?Gen:   Awake, no distress   ?Resp:  Normal effort, expiratory wheezing noted to all lung fields ?MSK:   Moves extremities without difficulty ?Other:  Patient moves bilateral upper and lower extremities without difficulty.  Right BKA.  Left lower extremity is wrapped.  Edema noted ? ?Medical Decision Making  ?Medically screening exam initiated at 7:18 PM.  Appropriate orders placed.  Thomas [name] was informed that the remainder of the evaluation will be completed by another provider, this initial triage assessment does not replace that evaluation, and the importance of remaining in the ED until their evaluation is complete. ? ? ?  ?Loni Beckwith,  PA-C ?01/13/22 1920 ? ?

## 2022-01-13 NOTE — ED Notes (Signed)
Patient transported to CT 

## 2022-01-13 NOTE — ED Provider Notes (Addendum)
?[facility] ?Provider Note ? ? ?CSN: [id number] ?Arrival date & time: 01/13/22  1739 ? ?  ? ?History ? ?Chief Complaint  ?Patient presents with  ? Chest Pain  ? ? ?Thomas [name] is a 71 y.o. male. ? ?Patient with hx chronic anemia, chf, presents with generalized weakness, mid chest pain, and sob in the past few  days. Symptoms acute onset, at rest, moderate, persistent, felt worse today. Notes generalized/bilateral weakness. No focal or unilateral numbness or weakness. No change in speech or vision. Denies trauma/fall. States compliant w home meds. Notes recent admission with same. Rare non prod cough. No sore throat. On fever or chills. ?increased leg  edema on left, hx RLE amputation. Pt relatively poor historian - level 5 caveat.  ? ?The history is provided by the patient, medical records and the EMS personnel. The history is limited by the condition of the patient.  ?Chest Pain ?Associated symptoms: shortness of breath and weakness   ?Associated symptoms: no abdominal pain, no fever, no headache, no palpitations and no vomiting   ? ?  ? ?Home Medications ?Prior to Admission medications   ?Medication Sig Start Date End Date Taking? Authorizing Provider  ?bisacodyl (DULCOLAX) 10 MG suppository Place 10 mg rectally daily.    [provider]  ?cefdinir (OMNICEF) 300 MG capsule Take 1 capsule (300 mg total) by mouth every 12 (twelve) hours. 01/03/22   Nita Sells, MD  ?cloNIDine (CATAPRES) 0.1 MG tablet Take 0.1 mg by mouth 3 (three) times daily.    [provider]  ?ferrous sulfate 325 (65 FE) MG tablet Take 1 tablet (325 mg total) by mouth daily with breakfast. 12/15/21 01/14/22  Antonieta Pert, MD  ?finasteride (PROSCAR) 5 MG tablet Take 1 tablet (5 mg total) by mouth daily. 10/10/19   Hosie Poisson, MD  ?gabapentin (NEURONTIN) 100 MG capsule Take 1 capsule (100 mg total) by mouth 3 (three) times daily. 12/24/21   Nita Sells, MD  ?lactulose  (CHRONULAC) 10 GM/15ML solution Take by mouth 2 (two) times daily as needed (constipation).    [provider]  ?levothyroxine (SYNTHROID) 50 MCG tablet Take 50 mcg by mouth daily before breakfast.     [provider]  ?metoprolol tartrate (LOPRESSOR) 50 MG tablet Take 1 tablet (50 mg total) by mouth 2 (two) times daily. 12/24/21   Nita Sells, MD  ?naloxegol oxalate (MOVANTIK) 25 MG TABS tablet Take 25 mg by mouth daily.    [provider]  ?OXYCONTIN 30 MG 12 hr tablet Take 30 mg by mouth 3 (three) times daily. 12/30/21   [provider]  ?pantoprazole (PROTONIX) 40 MG tablet Take 1 tablet by mouth 2 times daily. ?Patient taking differently: Take 40 mg by mouth 2 (two) times daily as needed (acid reflux). 10/01/21 02/22/22  Kayleen Memos, DO  ?polyethylene glycol (MIRALAX / GLYCOLAX) 17 g packet Take 17 g by mouth daily. 01/04/22   Nita Sells, MD  ?sodium zirconium cyclosilicate (LOKELMA) 10 g PACK packet Take 10 g by mouth daily. ?Patient not taking: Reported on 12/31/2021 12/24/21   Nita Sells, MD  ?tamsulosin (FLOMAX) 0.4 MG CAPS capsule Take 1 capsule (0.4 mg total) by mouth daily. 09/25/20   Jose Persia, MD  ?tiZANidine (ZANAFLEX) 4 MG tablet Take 4 mg by mouth every 8 (eight) hours as needed for muscle spasms. 11/26/21   [provider]  ?warfarin (COUMADIN) 5 MG tablet Take 0.5 tablets (2.5 mg total) by mouth daily. 01/03/22  Nita Sells, MD  ?   ? ?Allergies    ?Other, Oxymorphone, Aspirin, Beta vulgaris, Buspirone, Cabbage, Codeine, Diflunisal, Fish allergy, Fish-derived products, Hydroxyzine, Methadone, Pentazocine, Propoxyphene, Shellfish allergy, Sulfa antibiotics, Sulfasalazine, and Amoxicillin   ? ?Review of Systems   ?Review of Systems  ?Constitutional:  Negative for fever.  ?HENT:  Negative for sore throat.   ?Eyes:  Negative for redness.  ?Respiratory:  Positive for shortness of breath.   ?Cardiovascular:  Positive for  chest pain. Negative for palpitations.  ?Gastrointestinal:  Negative for abdominal pain, diarrhea and vomiting.  ?Genitourinary:  Negative for dysuria and flank pain.  ?Musculoskeletal:  Negative for neck pain and neck stiffness.  ?Skin:  Negative for rash.  ?Neurological:  Positive for weakness. Negative for speech difficulty and headaches.  ?Hematological:  Does not bruise/bleed easily.  ?Psychiatric/Behavioral:  Negative for confusion.   ? ?Physical Exam ?Updated Vital Signs ?BP (!) 155/81   Pulse 89   Temp 98.6 ?F (37 ?C) (Oral)   Resp 19   Ht 1.956 m (6\' 5" )   Wt 117.5 kg   SpO2 91%   BMI 30.72 kg/m?  ?Physical Exam ?Vitals and nursing note reviewed.  ?Constitutional:   ?   Appearance: Normal appearance. He is well-developed.  ?HENT:  ?   Head: Atraumatic.  ?   Nose: Nose normal.  ?   Mouth/Throat:  ?   Mouth: Mucous membranes are moist.  ?   Pharynx: Oropharynx is clear.  ?Eyes:  ?   General: No scleral icterus. ?   Conjunctiva/sclera: Conjunctivae normal.  ?Neck:  ?   Trachea: No tracheal deviation.  ?Cardiovascular:  ?   Rate and Rhythm: Normal rate and regular rhythm.  ?   Pulses: Normal pulses.  ?   Heart sounds: Normal heart sounds. No murmur heard. ?  No friction rub. No gallop.  ?Pulmonary:  ?   Effort: Pulmonary effort is normal. No accessory muscle usage or respiratory distress.  ?   Breath sounds: Rales present.  ?Abdominal:  ?   General: Bowel sounds are normal. There is no distension.  ?   Palpations: Abdomen is soft.  ?   Tenderness: There is no abdominal tenderness. There is no guarding.  ?Genitourinary: ?   Comments: No cva tenderness. ?Musculoskeletal:  ?   Cervical back: Normal range of motion and neck supple. No rigidity.  ?   Comments: Left foot/ankle/leg edema. Ulcer to plantar aspect left foot, with mild malodor, no crepitus. Chronic skin changes, dry/scaly skin to anterior lower leg. RLE amputation.   ?Skin: ?   General: Skin is warm and dry.  ?   Findings: No rash.  ?Neurological:   ?   Mental Status: He is alert.  ?   Comments: Alert, speech clear. Motor/sens grossly intact bil.   ?Psychiatric:     ?   Mood and Affect: Mood normal.  ? ? ?ED Results / Procedures / Treatments   ?Labs ?(all labs ordered are listed, but only abnormal results are displayed) ?Results for orders placed or performed during the hospital encounter of 01/13/22  ?Brain natriuretic peptide  ?Result Value Ref Range  ? B Natriuretic Peptide 292.9 (H) 0.0 - 100.0 pg/mL  ?Comprehensive metabolic panel  ?Result Value Ref Range  ? Sodium 139 135 - 145 mmol/L  ? Potassium 5.4 (H) 3.5 - 5.1 mmol/L  ? Chloride 106 98 - 111 mmol/L  ? CO2 23 22 - 32 mmol/L  ? Glucose, Bld 90 70 - 99  mg/dL  ? BUN 62 (H) 8 - 23 mg/dL  ? Creatinine, Ser 3.88 (H) 0.61 - 1.24 mg/dL  ? Calcium 6.4 (LL) 8.9 - 10.3 mg/dL  ? Total Protein 7.8 6.5 - 8.1 g/dL  ? Albumin 3.2 (L) 3.5 - 5.0 g/dL  ? AST 26 15 - 41 U/L  ? ALT 10 0 - 44 U/L  ? Alkaline Phosphatase 75 38 - 126 U/L  ? Total Bilirubin 0.7 0.3 - 1.2 mg/dL  ? GFR, Estimated 16 (L) >60 mL/min  ? Anion gap 10 5 - 15  ?CBC with Differential  ?Result Value Ref Range  ? WBC 5.3 4.0 - 10.5 K/uL  ? RBC 2.56 (L) 4.22 - 5.81 MIL/uL  ? Hemoglobin 8.1 (L) 13.0 - 17.0 g/dL  ? HCT 26.6 (L) 39.0 - 52.0 %  ? MCV 103.9 (H) 80.0 - 100.0 fL  ? MCH 31.6 26.0 - 34.0 pg  ? MCHC 30.5 30.0 - 36.0 g/dL  ? RDW 15.7 (H) 11.5 - 15.5 %  ? Platelets 205 150 - 400 K/uL  ? nRBC 0.0 0.0 - 0.2 %  ? Neutrophils Relative % 65 %  ? Neutro Abs 3.4 1.7 - 7.7 K/uL  ? Lymphocytes Relative 20 %  ? Lymphs Abs 1.0 0.7 - 4.0 K/uL  ? Monocytes Relative 9 %  ? Monocytes Absolute 0.5 0.1 - 1.0 K/uL  ? Eosinophils Relative 6 %  ? Eosinophils Absolute 0.3 0.0 - 0.5 K/uL  ? Basophils Relative 0 %  ? Basophils Absolute 0.0 0.0 - 0.1 K/uL  ? Immature Granulocytes 0 %  ? Abs Immature Granulocytes 0.01 0.00 - 0.07 K/uL  ?Troponin I (High Sensitivity)  ?Result Value Ref Range  ? Troponin I (High Sensitivity) 10 <18 ng/L  ? ?CT ABDOMEN PELVIS WO  CONTRAST ? ?Result Date: 12/30/2021 ?CLINICAL DATA:  Bowel obstruction suspected Abdominal distention, pain, nausea, decreased bowel movement for the last 2 days. Also chills and left leg worsened redness concerning for worsening

## 2022-01-13 NOTE — ED Triage Notes (Signed)
Pt BIB [facility], initially c/o leg swelling and chest pain. Pt also c/o chronic pain to neck and back. Pt refused asa  with EMS, states he doesn't have teeth to chew and didn't feel comfortable swallowing. On arrival, pt reports to EMS that his chest pain has resolved. ?

## 2022-01-14 ENCOUNTER — Encounter (HOSPITAL_COMMUNITY): Payer: Self-pay | Admitting: Internal Medicine

## 2022-01-14 ENCOUNTER — Inpatient Hospital Stay (HOSPITAL_COMMUNITY): Payer: Medicare Other

## 2022-01-14 DIAGNOSIS — T426X1A Poisoning by other antiepileptic and sedative-hypnotic drugs, accidental (unintentional), initial encounter: Secondary | ICD-10-CM | POA: Diagnosis present

## 2022-01-14 DIAGNOSIS — N184 Chronic kidney disease, stage 4 (severe): Secondary | ICD-10-CM | POA: Diagnosis not present

## 2022-01-14 DIAGNOSIS — S91302A Unspecified open wound, left foot, initial encounter: Secondary | ICD-10-CM | POA: Diagnosis present

## 2022-01-14 DIAGNOSIS — N179 Acute kidney failure, unspecified: Secondary | ICD-10-CM | POA: Diagnosis not present

## 2022-01-14 LAB — URINALYSIS, COMPLETE (UACMP) WITH MICROSCOPIC
Bilirubin Urine: NEGATIVE
Glucose, UA: NEGATIVE mg/dL
Ketones, ur: NEGATIVE mg/dL
Nitrite: NEGATIVE
Protein, ur: 100 mg/dL — AB
Specific Gravity, Urine: 1.009 (ref 1.005–1.030)
pH: 7 (ref 5.0–8.0)

## 2022-01-14 LAB — MAGNESIUM: Magnesium: 1.8 mg/dL (ref 1.7–2.4)

## 2022-01-14 LAB — COMPREHENSIVE METABOLIC PANEL
ALT: 9 U/L (ref 0–44)
AST: 19 U/L (ref 15–41)
Albumin: 2.8 g/dL — ABNORMAL LOW (ref 3.5–5.0)
Alkaline Phosphatase: 67 U/L (ref 38–126)
Anion gap: 11 (ref 5–15)
BUN: 59 mg/dL — ABNORMAL HIGH (ref 8–23)
CO2: 22 mmol/L (ref 22–32)
Calcium: 6.5 mg/dL — ABNORMAL LOW (ref 8.9–10.3)
Chloride: 107 mmol/L (ref 98–111)
Creatinine, Ser: 3.66 mg/dL — ABNORMAL HIGH (ref 0.61–1.24)
GFR, Estimated: 17 mL/min — ABNORMAL LOW (ref >60)
Glucose, Bld: 75 mg/dL (ref 70–99)
Potassium: 4.9 mmol/L (ref 3.5–5.1)
Sodium: 140 mmol/L (ref 135–145)
Total Bilirubin: 0.6 mg/dL (ref 0.3–1.2)
Total Protein: 7 g/dL (ref 6.5–8.1)

## 2022-01-14 LAB — CBC WITH DIFFERENTIAL/PLATELET
Abs Immature Granulocytes: 0.01 10*3/uL (ref 0.00–0.07)
Basophils Absolute: 0 10*3/uL (ref 0.0–0.1)
Basophils Relative: 1 %
Eosinophils Absolute: 0.3 10*3/uL (ref 0.0–0.5)
Eosinophils Relative: 6 %
HCT: 24.3 % — ABNORMAL LOW (ref 39.0–52.0)
Hemoglobin: 7.4 g/dL — ABNORMAL LOW (ref 13.0–17.0)
Immature Granulocytes: 0 %
Lymphocytes Relative: 24 %
Lymphs Abs: 1 10*3/uL (ref 0.7–4.0)
MCH: 31 pg (ref 26.0–34.0)
MCHC: 30.5 g/dL (ref 30.0–36.0)
MCV: 101.7 fL — ABNORMAL HIGH (ref 80.0–100.0)
Monocytes Absolute: 0.4 10*3/uL (ref 0.1–1.0)
Monocytes Relative: 10 %
Neutro Abs: 2.5 10*3/uL (ref 1.7–7.7)
Neutrophils Relative %: 59 %
Platelets: 168 10*3/uL (ref 150–400)
RBC: 2.39 MIL/uL — ABNORMAL LOW (ref 4.22–5.81)
RDW: 15.4 % (ref 11.5–15.5)
WBC: 4.3 10*3/uL (ref 4.0–10.5)
nRBC: 0 % (ref 0.0–0.2)

## 2022-01-14 LAB — CBG MONITORING, ED
Glucose-Capillary: 85 mg/dL (ref 70–99)
Glucose-Capillary: 85 mg/dL (ref 70–99)
Glucose-Capillary: 108 mg/dL — ABNORMAL HIGH (ref 70–99)

## 2022-01-14 LAB — C-REACTIVE PROTEIN: CRP: 1.4 mg/dL — ABNORMAL HIGH (ref <1.0)

## 2022-01-14 LAB — RESP PANEL BY RT-PCR (FLU A&B, COVID) ARPGX2
Influenza A by PCR: NEGATIVE
Influenza B by PCR: NEGATIVE
SARS Coronavirus 2 by RT PCR: NEGATIVE

## 2022-01-14 LAB — GLUCOSE, CAPILLARY
Glucose-Capillary: 88 mg/dL (ref 70–99)
Glucose-Capillary: 105 mg/dL — ABNORMAL HIGH (ref 70–99)

## 2022-01-14 LAB — PROTIME-INR
INR: 2.7 — ABNORMAL HIGH (ref 0.8–1.2)
Prothrombin Time: 28.9 seconds — ABNORMAL HIGH (ref 11.4–15.2)

## 2022-01-14 LAB — SODIUM, URINE, RANDOM: Sodium, Ur: 73 mmol/L

## 2022-01-14 LAB — PROCALCITONIN: Procalcitonin: <0.1 ng/mL

## 2022-01-14 LAB — APTT: aPTT: 64 seconds — ABNORMAL HIGH (ref 24–36)

## 2022-01-14 IMAGING — CT CT CHEST W/O CM
2 of 4 series · 14 of 36 positions shown, 17 images · non-contrast
Comparison: [DATE], [DATE].

CLINICAL DATA: Pneumonia, complication suspected.



[Series 5: thorax 2.0 · axial · 0.98mm/px · z∈[+157,+429]mm · 11 of 153 slices shown, 14 images]
[im 9/153  mediastinal]
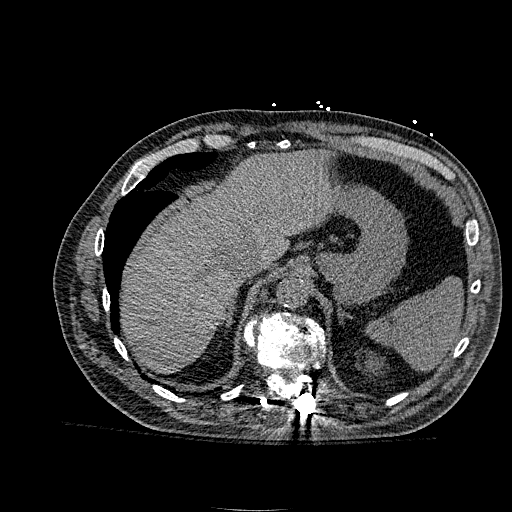
[im 9/153  lung]
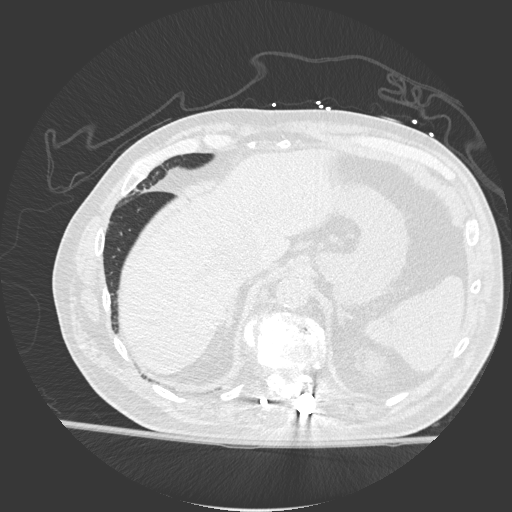
[im 25/153  lung]
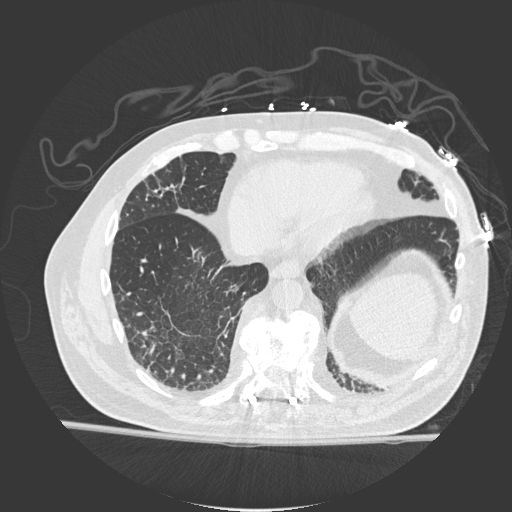
[im 41/153  lung]
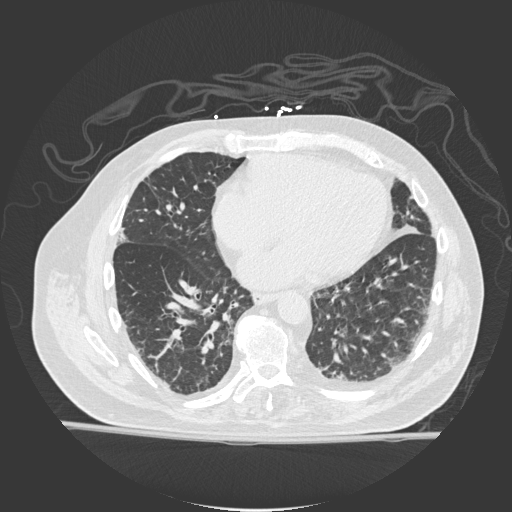
[im 49/153  lung]
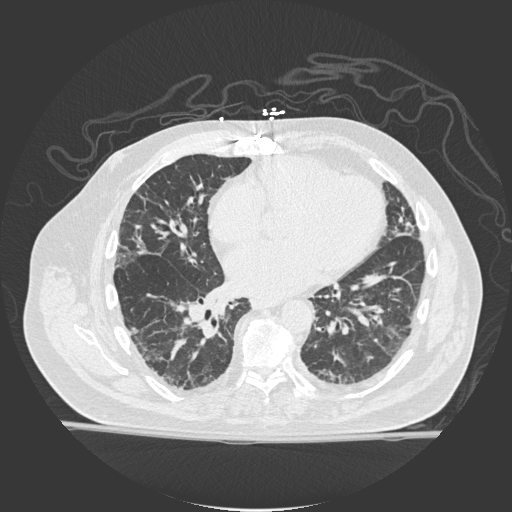
[im 65/153  mediastinal]
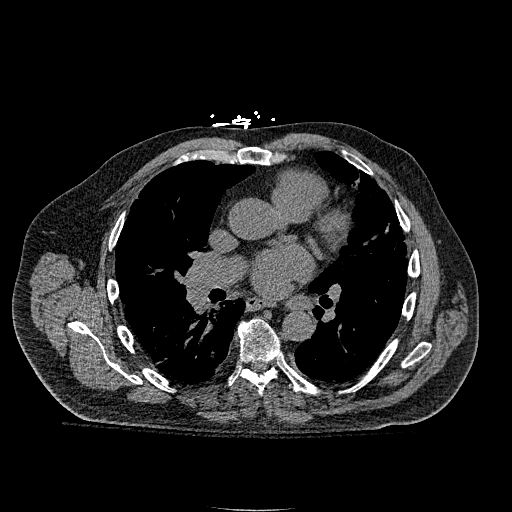
[im 65/153  lung]
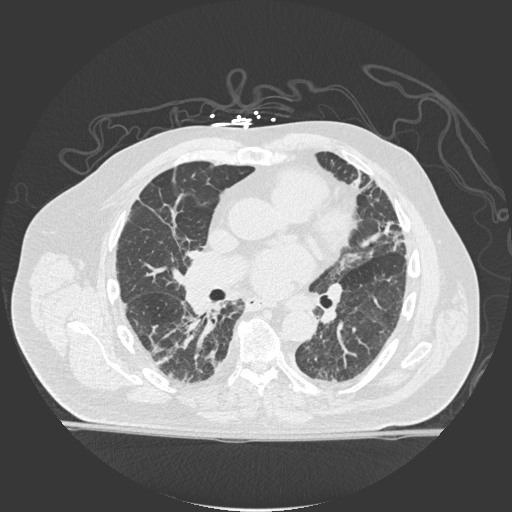
[im 81/153  lung]
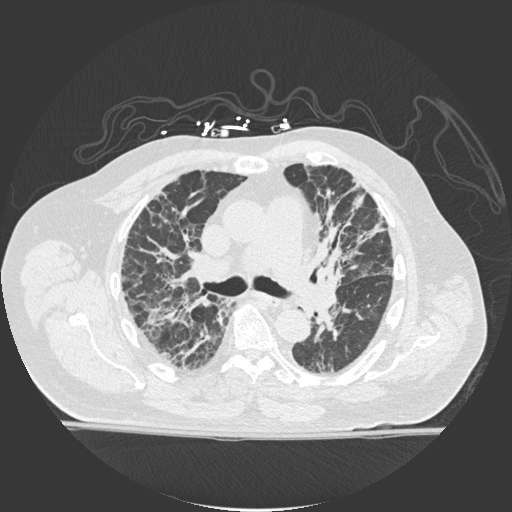
[im 89/153  lung]
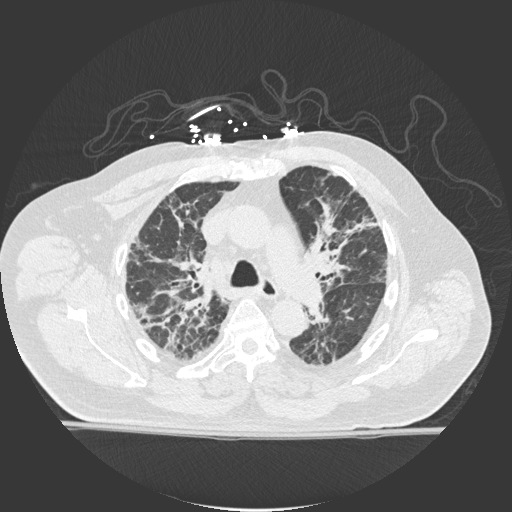
[im 105/153  lung]
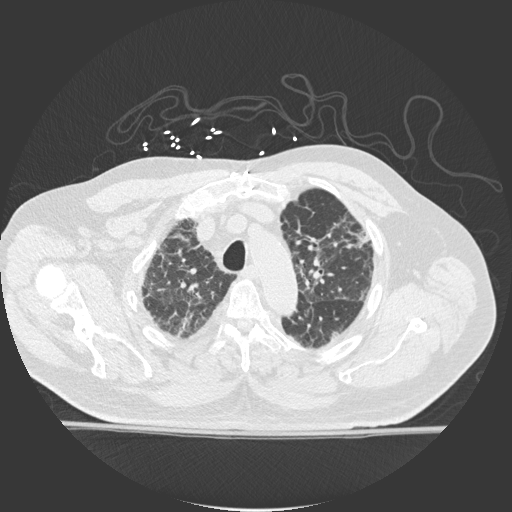
[im 113/153  mediastinal]
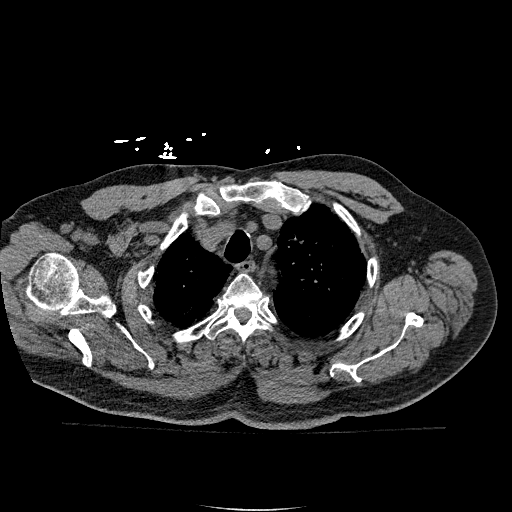
[im 113/153  lung]
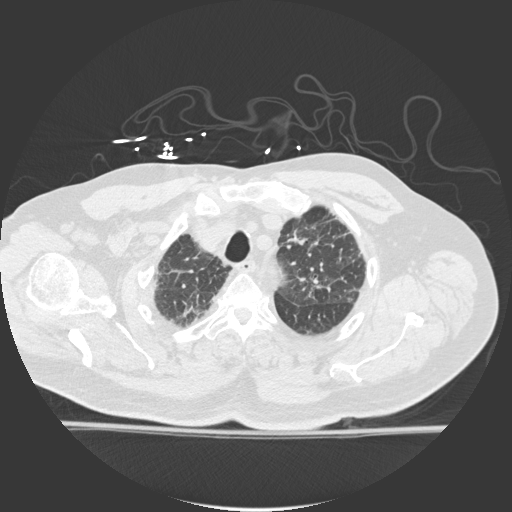
[im 129/153  lung]
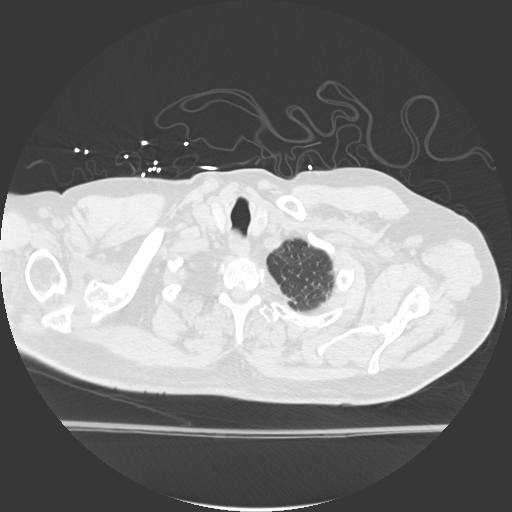
[im 145/153  lung]
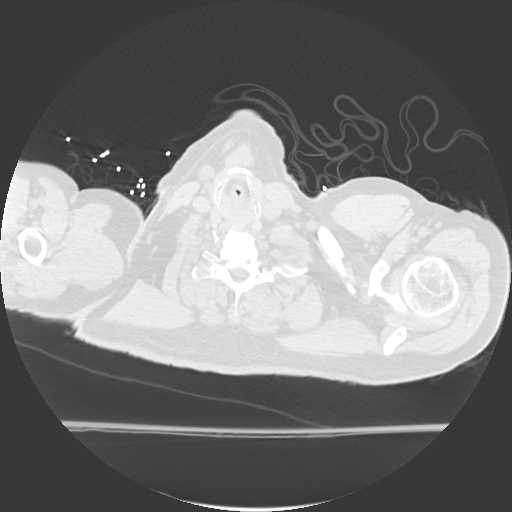

[Series 7: coronal · coronal · 0.60mm/px · 3 of 107 slices shown]
[im 22/107  lung]
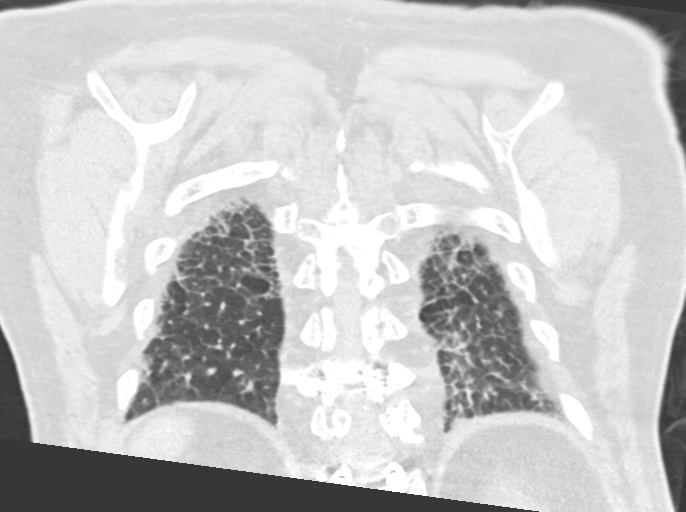
[im 43/107  lung]
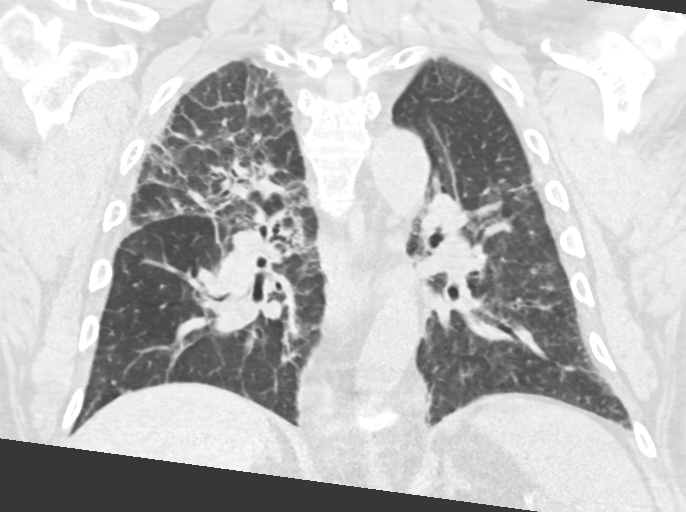
[im 64/107  lung]
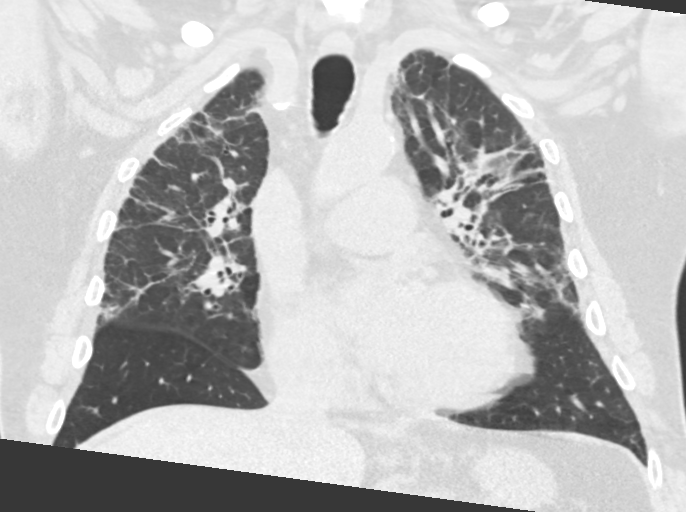

[14 of 36 positions shown; findings below may reference images not displayed]

FINDINGS: Cardiovascular: Heart is mildly enlarged and there is no pericardial
effusion. Scattered coronary artery calcifications are noted. There
is atherosclerotic calcification of the aorta with borderline
aneurysmal dilatation measuring 4 cm. The pulmonary trunk is
distended which may be associated with underlying pulmonary artery
hypertension.

Mediastinum/Nodes: No mediastinal or axillary lymphadenopathy.
Evaluation of the hila is limited due to lack of IV contrast. An 8
mm hypodensity is present in the left lobe of the thyroid gland. The
trachea and esophagus are within normal limits.

Lungs/Pleura: Apical pleural scarring is noted bilaterally. There
are multifocal regions of ground-glass opacities with strandy
parenchymal opacities and bronchiectasis, not significantly changed
from the prior exam. No consolidation, effusion, or pneumothorax.

Upper Abdomen: No acute abnormality.

Musculoskeletal: Sternotomy wires are noted over the midline.
Degenerative changes are present in the thoracic spine with spinal
fusion hardware and compression deformities in the lower thoracic
spine, similar in appearance to the prior exam. No acute fracture is
identified.
IMPRESSION: 1. Ground-glass opacities with bronchiectasis in strandy opacities
in the lungs bilaterally, not significantly changed from the prior
exam and may be related to chronic interstitial lung disease. No
acute infiltrate is identified.
2. Dilatation of the pulmonary trunk which may be associated with
underlying pulmonary artery hypertension.
3. Aortic atherosclerosis with mild aneurysmal dilatation of the
ascending aorta measuring 4 cm. Recommend annual imaging followup by
CTA or MRA. This recommendation follows [EL]
ACCF/AHA/AATS/ACR/ASA/SCA/WEWER/WEWER/WEWER/WEWER Guidelines for the
Diagnosis and Management of Patients with Thoracic Aortic Disease.
Circulation. [EL]; 121: [id number]. Aortic aneurysm NOS ([EL]-[EL])
4. Mild cardiomegaly with scattered coronary artery calcifications.

## 2022-01-14 IMAGING — US US RENAL
1 series · 14 of 25 positions shown · non-contrast
Comparison: [DATE], [DATE].

CLINICAL DATA: Renal failure.

EXAM:
RENAL / URINARY TRACT ULTRASOUND COMPLETE

[Series 1: us renal · 14 of 67 slices shown]
[im 1/67]
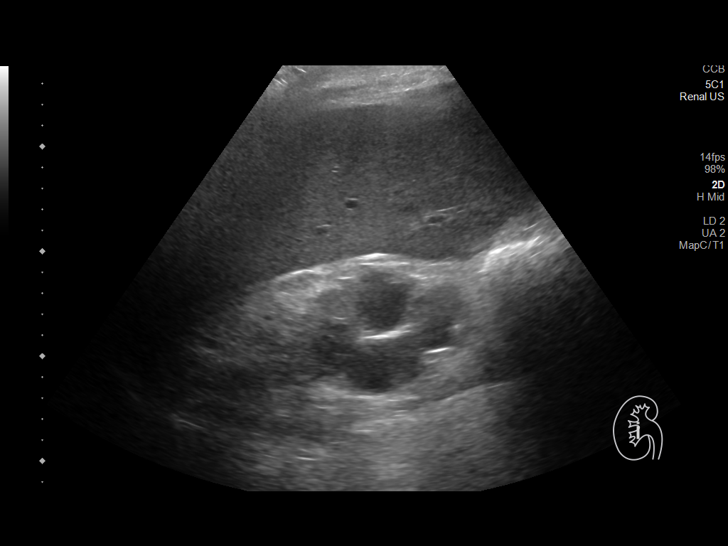
[im 6/67]
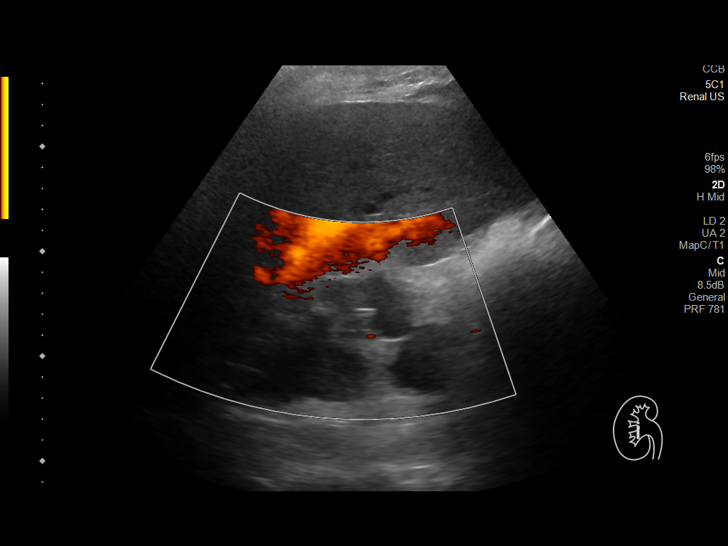
[im 12/67]
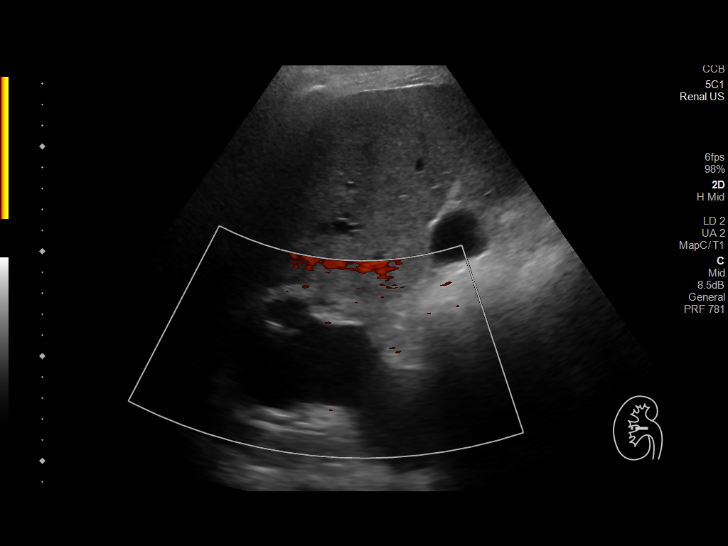
[im 17/67]
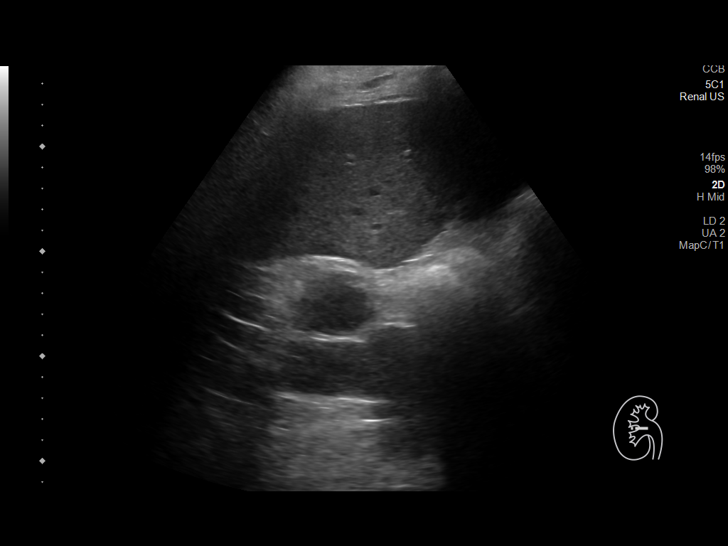
[im 23/67]
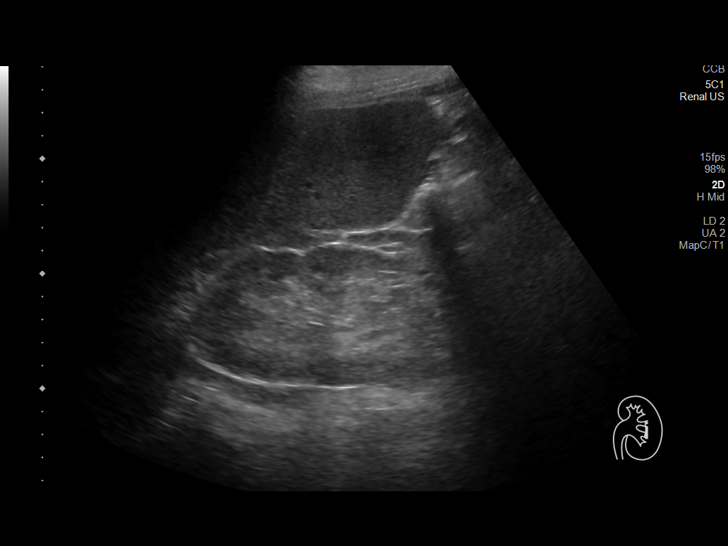
[im 25/67]
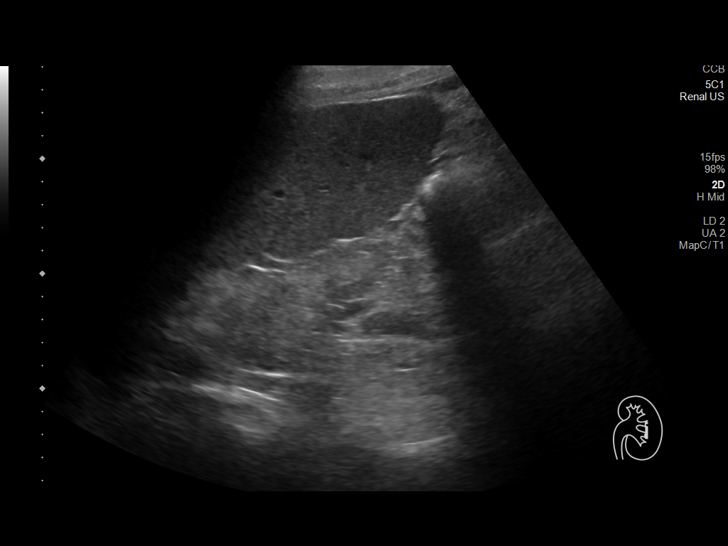
[im 31/67]
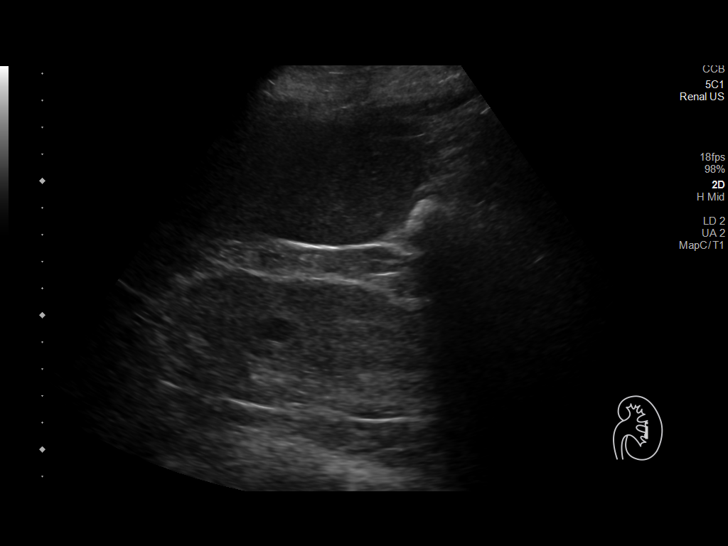
[im 36/67]
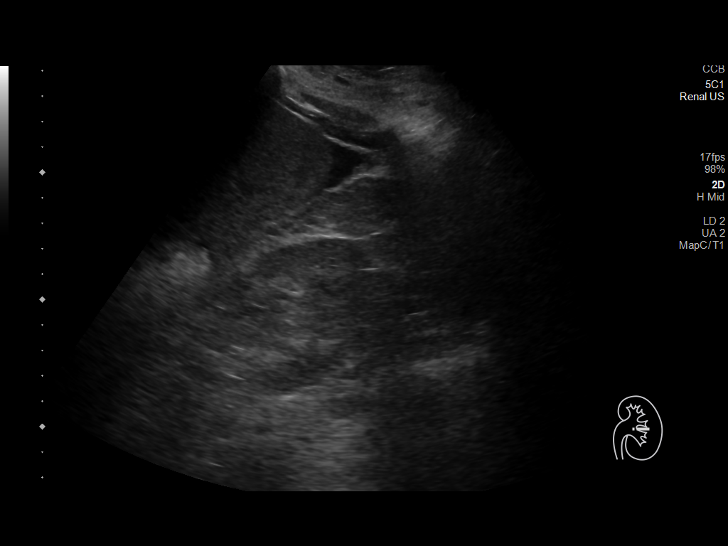
[im 42/67]
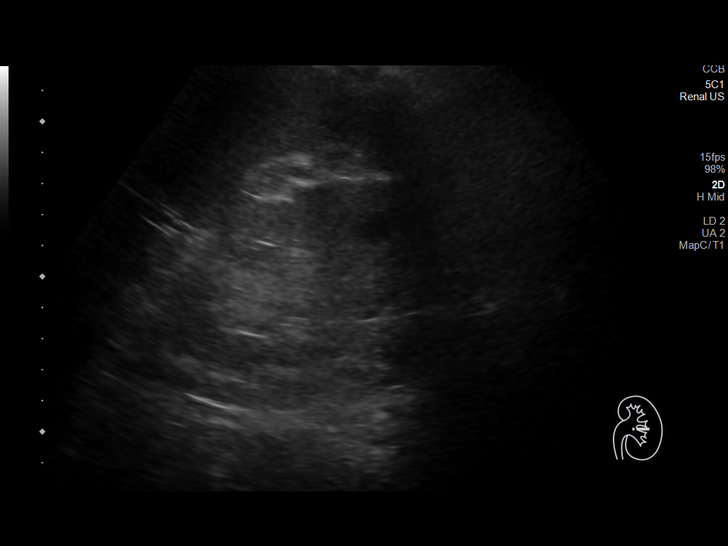
[im 45/67]
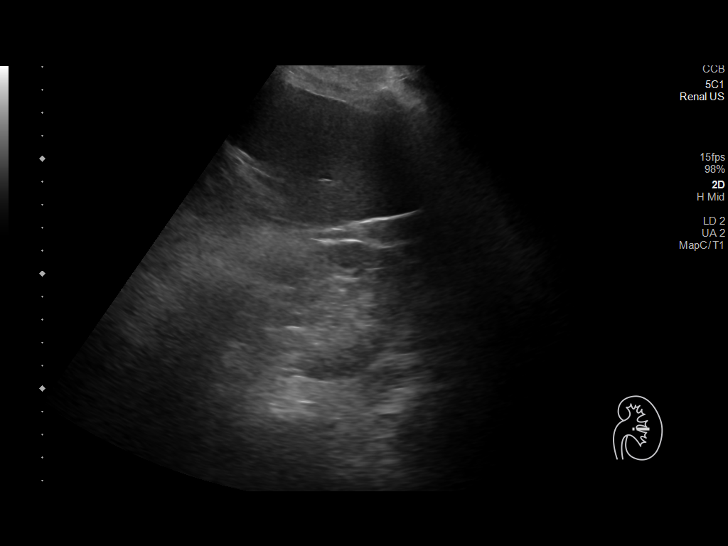
[im 50/67]
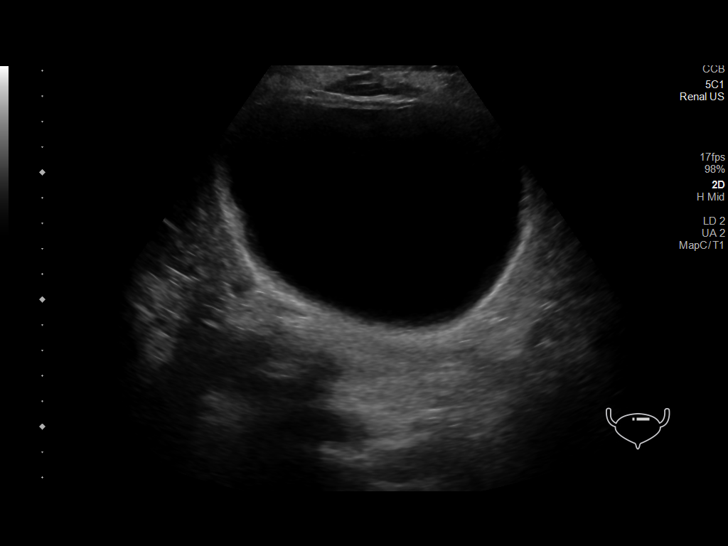
[im 56/67]
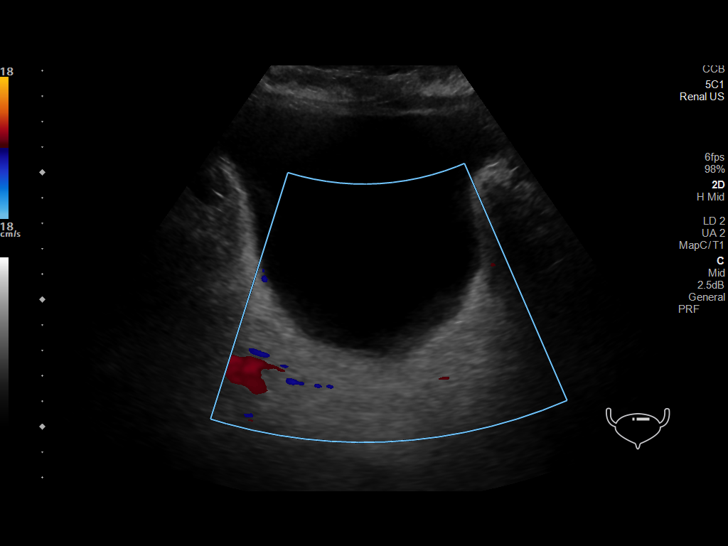
[im 61/67]
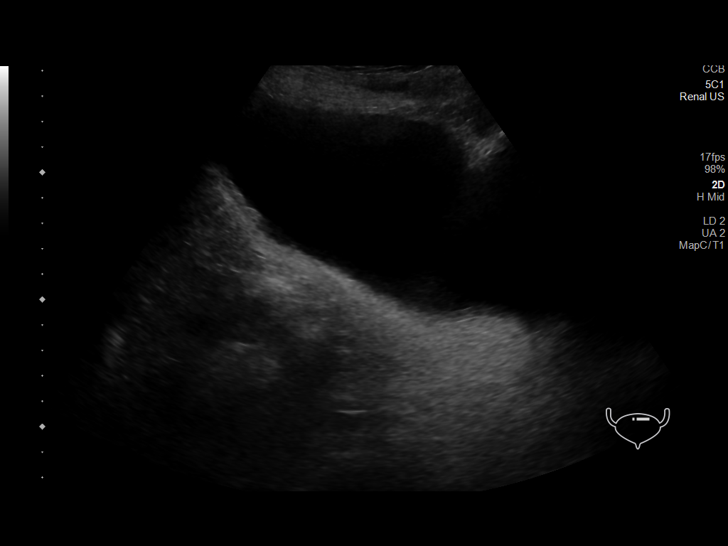
[im 67/67]
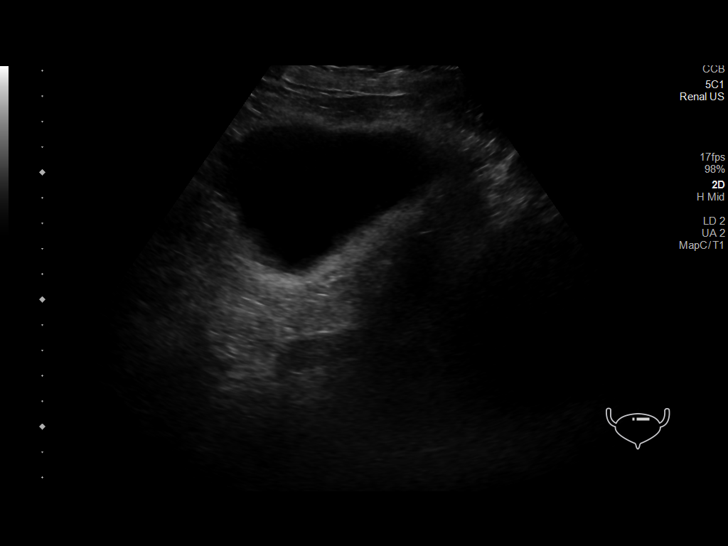

[14 of 25 positions shown; findings below may reference images not displayed]

FINDINGS: Right Kidney:

Renal measurements: 13.5 x 6.3 x 6.5 cm = volume: 288.0 mL.
Increased echogenicity. There is severe hydronephrosis with renal
cortical thinning. The distal right ureter is distended measuring
2.3 cm.

Left Kidney:

Renal measurements: 13.7 x 6.2 x 6.2 cm = volume: 276.8 mL. There is
suboptimal visualization of the left kidney due to immobility and
patient is sleeping. Mildly increased echogenicity. There is a cyst
in the midpole measuring 1.6 x 0.9 x 1.2 cm. No mass or
hydronephrosis visualized.

Bladder:

Appears normal for degree of bladder distention.

Other:

A trace amount of ascites is noted adjacent to the spleen.
IMPRESSION: 1. Increased renal echogenicity bilaterally, compatible with medical
renal disease.
2. Severe hydroureteronephrosis on the right with renal cortical
thinning, unchanged from the prior exams.
3. Left renal cyst.
4. Trace ascites adjacent to the spleen.

## 2022-01-14 MED ORDER — CALCIUM GLUCONATE-NACL 2-0.675 GM/100ML-% IV SOLN
2.0000 g | Freq: Once | INTRAVENOUS | Status: AC
Start: 2022-01-14 — End: 2022-01-14
  Administered 2022-01-14: 2000 mg via INTRAVENOUS
  Filled 2022-01-14: qty 100

## 2022-01-14 MED ORDER — LACTATED RINGERS IV SOLN: INTRAVENOUS | Status: AC

## 2022-01-14 MED ORDER — WARFARIN - PHARMACIST DOSING INPATIENT
Freq: Every day | Status: DC
Start: 2022-01-14 — End: 2022-01-19

## 2022-01-14 MED ORDER — CHLORHEXIDINE GLUCONATE CLOTH 2 % EX PADS
6.0000 | MEDICATED_PAD | Freq: Every day | CUTANEOUS | Status: DC
  Administered 2022-01-15 – 2022-01-18 (×5): 6 via TOPICAL

## 2022-01-14 MED ORDER — VITAMIN K1 10 MG/ML IJ SOLN
1.0000 mg | Freq: Once | INTRAVENOUS | Status: AC
  Administered 2022-01-14: 1 mg via INTRAVENOUS
  Filled 2022-01-14: qty 0.1

## 2022-01-14 MED ORDER — WARFARIN SODIUM 2.5 MG PO TABS
2.5000 mg | ORAL_TABLET | Freq: Once | ORAL | Status: AC
  Administered 2022-01-14: 2.5 mg via ORAL
  Filled 2022-01-14: qty 1

## 2022-01-14 MED ORDER — HYDRALAZINE HCL 20 MG/ML IJ SOLN
10.0000 mg | Freq: Four times a day (QID) | INTRAMUSCULAR | Status: DC | PRN
Start: 2022-01-14 — End: 2022-01-19

## 2022-01-14 MED ORDER — FENTANYL CITRATE PF 50 MCG/ML IJ SOSY
50.0000 ug | PREFILLED_SYRINGE | Freq: Once | INTRAMUSCULAR | Status: AC
  Administered 2022-01-14: 50 ug via INTRAVENOUS
  Filled 2022-01-14: qty 1

## 2022-01-14 NOTE — Progress Notes (Signed)
?Progress note ? ?HPI:  ? 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 ? ?Of note, patient was recently hospitalized at [facility] long hospital from 3/13 until 3/17.  Patient was felt to be suffering from left lower extremity cellulitis and multifocal pneumonia.  Patient was treated with intravenous antibiotics which was later transitioned to oral Omnicef therapy at time of discharge.   Patient also found to have acute kidney injury superimposed on his chronic kidney disease with creatinine peaking at 4.47 on arrival and downtrending to 2.3 at time of discharge.  Patient was discharged on 3/17. ? ?Patient explains that for the past few days since his recent discharge he has developed progressively worsening generalized weakness with increasing shortness of breath.  Patient is also complaining of associated left lower extremity edema.  Patient denies any fevers, sick contacts, recent travel or contact with confirmed COVID-19 infection. ? ?As patient's symptoms continue to worsen he has been also experiencing episodic chest discomfort.  Earlier in the day on 3/27, patient's home health nurse came to visit him to dress the wounds on his left lower extremity when he suddenly began complaining of chest pain while sitting in his recliner.  Nurse contacted EMS who promptly came to evaluate the patient and brought him into [facility] emergency department for evaluation.  Patient declined  aspirin in route. ? ?Upon evaluation in the emergency department patient was found once again found to have acute kidney injury superimposed on chronic kidney disease with creatinine of 3.88.  Patient was also noted to have mild hyperkalemia 5.7.  EKG revealed no dynamic ST segment change and troponin was unremarkable at 10.  Hospitalist group was called to assess the patient for admission to the hospital. ? ? ? ?Physical Exam: ? ?Vitals:  ? 01/14/22 0415 01/14/22 0430 01/14/22 0530 01/14/22 0600  ?BP: 135/81 134/73 134/76 (!) 153/82  ?Pulse: 61 (!) 57 (!) 57 (!) 59  ?Resp: 14 12 15 19   ?Temp:      ?TempSrc:      ?SpO2: 92% 95% 91% 92%  ?Weight:      ?Height:      ? ? ?Constitutional: Awake alert and oriented x3,  ?Skin: Unstageable wound on left heel, present on admission.  Otherwise, no rashes, no lesions, good skin turgor noted. ?Eyes: Pupils are equally reactive to light.  No evidence of scleral icterus or conjunctival pallor.  ?ENMT: Moist mucous membranes noted.  Posterior pharynx clear of any exudate or lesions.   ?Neck: normal, supple, no masses, no thyromegaly.  No evidence of jugular venous distension.   ?Respiratory: clear to auscultation bilaterally, no wheezing, no crackles. Normal respiratory effort. No accessory muscle use.  ?Cardiovascular: Regular rate and rhythm, no murmurs / rubs / gallops. No extremity edema. 2+ pedal pulses. No carotid bruits.  ?Chest:   Generalized anterior chest wall tenderness without crepitus or deformity.   ?Back:   Notable tenderness along the cervical spine without crepitus or deformity. ?Abdomen: Abdomen is soft and nontender.  No evidence of intra-abdominal masses.  Positive bowel sounds noted  in all quadrants.   ?Musculoskeletal: Right BKA.  No joint deformity upper and lower extremities. Good ROM, no contractures. Normal muscle tone.  ?Neurologic: Intermittent jerking of the extremities throughout the entire interview and exam.  CN 2-12 grossly intact. Sensation  intact.  Patient moving all 4 extremities spontaneously.  Patient is following all commands.  Patient is responsive to verbal stimuli.   ?Psychiatric: Patient exhibits anxious mood with appropriate affect.  Patient seems to possess insight as to their current situation.   ? ?Data Reviewed: ? ?I have personally reviewed and interpreted labs, imaging. ? ?Significant findings are BNP 292, troponin 10, creatinine 3.88, BUN 62, potassium 5.4 hemoglobin 8.1, white blood cell count 5.3, COVID-19 PCR testing pending. ? ?Chest x-ray personally reviewed revealing extremely patchy bilateral infiltrates ? ?EKG: Personally reviewed.  Rhythm is normal sinus rhythm with heart rate of 60 bpm.  QTc of 490 ms. no dynamic ST segment changes appreciated. ? ? ?Assessment and Plan: ?* Acute renal failure superimposed on stage 4 chronic kidney disease ([place]) ?Patient presenting with recurrent acute kidney injury of unclear etiology ?Creatinine currently 3.88, up from baseline of 2.3 at time of recent discharge from the hospital ?Gentle intravenous hydration ?Strict input and output monitoring ?Avoiding nephrotoxic agents ?Monitoring renal function and electrolytes with serial chemistries ?Imaging consistent with bladder outlet obstruction, urology consult obtained ? ? ?Chest pain ?Patient complained of atypical chest discomfort earlier in the day on 3/27 while at rest ?I believe the etiology is musculoskeletal  ?Patient is currently chest pain-free ?2 sets of cardiac enzymes unremarkable ?EKG shows no dynamic ST segment change ?Continue to monitor patient on telemetry ? ? ?Gabapentin-induced toxicity ?Patient complaining of a 2-day history of intermittent jerking of the extremities ?After examining the patient I believe this is likely secondary to gabapentin toxicity due to decreased gabapentin clearance from renal injury ?Temporarily holding gabapentin ?Gabapentin can be resumed once renal injury resolves ?Jerking should therefore be  self-limiting ? ?Hyperkalemia ?Patient exhibiting mild hyperkalemia, likely secondary to diminished renal function ?No evidence of impact of hyperkalemia on EKG ?Providing patient with a dose of Lokelma ?Monitoring renal function and electrolytes with serial chemistries ?Monitoring patient on telemetry ? ? ?Deep vein thrombosis (DVT) of left lower extremity (HCC) ?Coumadin currently supratherapeutic ?Giving 1 mg of intravenous vitamin K per pharmacy recommendation. ?We will resume once able to maintain INR between 2 and 3 ? ? ?Type 2 diabetes mellitus with stage 4 chronic kidney disease, without long-term current use of insulin ([place]) ?Patient been placed on Accu-Cheks before every meal and nightly with sliding scale insulin ?Holding home regimen of hypoglycemics ?Hemoglobin A1c 4.8% earlier in the month ?Diabetic Diet ? ?Chronic combined systolic and diastolic congestive heart failure ([place]) ?No obvious clinical evidence of cardiogenic volume overload on exam ?Vague patchy bilateral infiltrates on chest x-ray however unlikely to be due to cardiogenic volume overload.   ?Will evaluate further with BNP, CRP, procalcitonin and CT chest ? ?Essential hypertension ?Resume patients home regimen of oral antihypertensives ?Titrate antihypertensive regimen as necessary to achieve adequate BP control ?PRN intravenous antihypertensives for excessively elevated blood pressure ? ?Open wound of left heel ?Notable wound on left heel, present on admission ?We will place wound care consult for assistance in managing and dressing this wound ? ?Hypothyroidism ?Resume home regimen of Synthroid ? ?BPH (benign prostatic hyperplasia) ?Continue home regimen of Flomax and finasteride ? ?Obtain physical therapy evaluation to see if he could benefit from short-term rehab. ? ? ?Destynie Toomey A MD ? ?  01/14/2022 9:57 AM ?

## 2022-01-14 NOTE — Assessment & Plan Note (Signed)
?   Patient complaining of a 2-day history of intermittent jerking of the extremities ?? After examining the patient I believe this is likely secondary to gabapentin toxicity due to decreased gabapentin clearance from renal injury ?? Temporarily holding gabapentin ?? Gabapentin can be resumed once renal injury resolves ?? Jerking should therefore be self-limiting ?

## 2022-01-14 NOTE — Assessment & Plan Note (Signed)
.   Resume home regimen of Synthroid 

## 2022-01-14 NOTE — ED Notes (Signed)
Pt resting in bed. Even and non-labored respirations. Pt states pain is a 7/10 and tolerable if he doesn't try to move.  ? ?

## 2022-01-14 NOTE — Assessment & Plan Note (Signed)
?   Patient exhibiting mild hyperkalemia, likely secondary to diminished renal function ?? No evidence of impact of hyperkalemia on EKG ?? Providing patient with a dose of Lokelma ?? Monitoring renal function and electrolytes with serial chemistries ?? Monitoring patient on telemetry ? ?

## 2022-01-14 NOTE — Assessment & Plan Note (Signed)
?   Patient been placed on Accu-Cheks before every meal and nightly with sliding scale insulin ?? Holding home regimen of hypoglycemics ?? Hemoglobin A1c 4.8% earlier in the month ?? Diabetic Diet ? ?

## 2022-01-14 NOTE — Assessment & Plan Note (Addendum)
?   Patient presenting with recurrent acute kidney injury of unclear etiology ?? Creatinine currently 3.88, up from baseline of 2.3 at time of recent discharge from the hospital ?? Obtaining bladder scan/renal ultrasound, urine electrolytes, urinalysis ?? Gentle intravenous hydration ?? Strict input and output monitoring ?? Avoiding nephrotoxic agents ?? Monitoring renal function and electrolytes with serial chemistries ? ?

## 2022-01-14 NOTE — Progress Notes (Signed)
ANTICOAGULATION CONSULT NOTE - Initial Consult ? ?Pharmacy Consult for Coumadin ?Indication: pulmonary embolus ? ?Allergies  ?Allergen Reactions  ? Other Other (See Comments)  ?  Blood pressure issues ?Per [facility] hospital: Pt states he can only take these pain meds or else he gets very sick: Oxycontin,morphine,demerol, and dilaudid are the only pain meds pt states he can take.  ?  ? Oxymorphone Other (See Comments)  ?  Causes kidney problems  ? Aspirin Nausea And Vomiting  ?  Per [facility], pt does not know reaction   ? Beta Vulgaris Nausea And Vomiting  ? Buspirone Other (See Comments)  ?  Affected his head ?  ? Cabbage Nausea And Vomiting  ? Codeine Other (See Comments)  ?  Pt does not remember the reaction but knows he can not take ?  ? Diflunisal Nausea And Vomiting and Other (See Comments)  ? Fish Allergy Nausea And Vomiting  ? Fish-Derived Products Nausea And Vomiting  ? Hydroxyzine Nausea And Vomiting  ? Methadone Other (See Comments)  ?  Made him loopy  ? Pentazocine Other (See Comments)  ?  Pt does not remember the reaction   ? Propoxyphene Other (See Comments)  ?  Pt does not remember the reaction ? ?  ? Shellfish Allergy Nausea And Vomiting  ? Sulfa Antibiotics Hives  ? Sulfasalazine Other (See Comments)  ?  Pt does not remember the reaction  ? Amoxicillin Nausea And Vomiting and Rash  ?  Per [facility]  ? ? ?Patient Measurements: ?Height: 6\' 5"  (195.6 cm) ?Weight: 117.5 kg (259 lb 0.7 oz) ?IBW/kg (Calculated) : 89.1 ? ?Vital Signs: ?Temp: 98.6 ?F (37 ?C) (03/27 1832) ?Temp Source: Oral (03/27 1832) ?BP: 110/83 (03/28 0115) ?Pulse Rate: 61 (03/28 0115) ? ?Labs: ?Recent Labs  ?  01/13/22 ?1958 01/13/22 ?2226  ?HGB 8.1*  --   ?HCT 26.6*  --   ?PLT 205  --   ?LABPROT  --  71.4*  ?INR  --  8.7*  ?CREATININE 3.88*  --   ?TROPONINIHS 10 9  ? ? ?Estimated Creatinine Clearance: 25.9 mL/min (A) (by C-G formula based on SCr of 3.88 mg/dL (H)). ? ? ?Medical History: ?Past Medical History:   ?Diagnosis Date  ? Arthritis   ? BPH (benign prostatic hyperplasia) 11/24/2019  ? Chronic pain syndrome 11/24/2019  ? CKD (chronic kidney disease), stage IV ([place]) 11/24/2019  ? Diabetes mellitus without complication ([place])   ? Gout   ? Hypertension   ? Muscle pain   ? Physical deconditioning 11/24/2019  ? Scars   ? Swelling   ? ? ?Medications:  ?No current facility-administered medications on file prior to encounter.  ? ?Current Outpatient Medications on File Prior to Encounter  ?Medication Sig Dispense Refill  ? bisacodyl (DULCOLAX) 10 MG suppository Place 10 mg rectally daily.    ? cefdinir (OMNICEF) 300 MG capsule Take 1 capsule (300 mg total) by mouth every 12 (twelve) hours. 6 capsule 0  ? cloNIDine (CATAPRES) 0.1 MG tablet Take 0.1 mg by mouth 3 (three) times daily.    ? ferrous sulfate 325 (65 FE) MG tablet Take 1 tablet (325 mg total) by mouth daily with breakfast. 30 tablet 0  ? finasteride (PROSCAR) 5 MG tablet Take 1 tablet (5 mg total) by mouth daily. 30 tablet 0  ? gabapentin (NEURONTIN) 100 MG capsule Take 1 capsule (100 mg total) by mouth 3 (three) times daily. 30 capsule 3  ? lactulose (CHRONULAC) 10 GM/15ML solution Take  by mouth 2 (two) times daily as needed (constipation).    ? levothyroxine (SYNTHROID) 50 MCG tablet Take 50 mcg by mouth daily before breakfast.     ? metoprolol tartrate (LOPRESSOR) 50 MG tablet Take 1 tablet (50 mg total) by mouth 2 (two) times daily. 60 tablet 1  ? naloxegol oxalate (MOVANTIK) 25 MG TABS tablet Take 25 mg by mouth daily.    ? OXYCONTIN 30 MG 12 hr tablet Take 30 mg by mouth 3 (three) times daily.    ? pantoprazole (PROTONIX) 40 MG tablet Take 1 tablet by mouth 2 times daily. (Patient taking differently: Take 40 mg by mouth 2 (two) times daily as needed (acid reflux).) 60 tablet 0  ? polyethylene glycol (MIRALAX / GLYCOLAX) 17 g packet Take 17 g by mouth daily. 14 each 0  ? sodium zirconium cyclosilicate (LOKELMA) 10 g PACK packet Take 10 g by mouth daily. (Patient not  taking: Reported on 12/31/2021) 30 each 3  ? tamsulosin (FLOMAX) 0.4 MG CAPS capsule Take 1 capsule (0.4 mg total) by mouth daily. 30 capsule 0  ? tiZANidine (ZANAFLEX) 4 MG tablet Take 4 mg by mouth every 8 (eight) hours as needed for muscle spasms.    ? warfarin (COUMADIN) 5 MG tablet Take 0.5 tablets (2.5 mg total) by mouth daily.    ?  ? ?Assessment: ?71 y.o. male admitted with acute on chronic kidney failure, h/o DVT and supratherapeutic INR, for Coumadin.   ? ?Goal of Therapy:  ?INR 2-3 ?Monitor platelets by anticoagulation protocol: Yes ?  ?Plan:  ?Discussed with Dr. Cyd Silence.  Will give Vitamin K 1 mg IV now to keep INR from rising further, recheck in am.   ? ?Tnya Ades, Bronson Curb ?01/14/2022,1:43 AM ? ? ?

## 2022-01-14 NOTE — Progress Notes (Signed)
ANTICOAGULATION CONSULT NOTE - Follow Up Consult ? ?Pharmacy Consult for Coumadin ?Indication: hx of DVT ? ?Allergies  ?Allergen Reactions  ? Other Other (See Comments)  ?  Blood pressure issues ?Per [facility] hospital: Pt states he can only take these pain meds or else he gets very sick: Oxycontin,morphine,demerol, and dilaudid are the only pain meds pt states he can take.  ?  ? Oxymorphone Other (See Comments)  ?  Causes kidney problems  ? Aspirin Nausea And Vomiting  ?  Per [facility], pt does not know reaction   ? Beta Vulgaris Nausea And Vomiting  ? Buspirone Other (See Comments)  ?  Affected his head ?  ? Cabbage Nausea And Vomiting  ? Codeine Other (See Comments)  ?  Pt does not remember the reaction but knows he can not take ?  ? Diflunisal Nausea And Vomiting and Other (See Comments)  ? Fish Allergy Nausea And Vomiting  ? Fish-Derived Products Nausea And Vomiting  ? Hydroxyzine Nausea And Vomiting  ? Methadone Other (See Comments)  ?  Made him loopy  ? Pentazocine Other (See Comments)  ?  Pt does not remember the reaction   ? Propoxyphene Other (See Comments)  ?  Pt does not remember the reaction ? ?  ? Shellfish Allergy Nausea And Vomiting  ? Sulfa Antibiotics Hives  ? Sulfasalazine Other (See Comments)  ?  Pt does not remember the reaction  ? Amoxicillin Nausea And Vomiting and Rash  ?  Per [facility]  ? ? ?Patient Measurements: ?Height: 6\' 5"  (195.6 cm) ?Weight: 117.5 kg (259 lb 0.7 oz) ?IBW/kg (Calculated) : 89.1 ? ?Vital Signs: ?Temp: 99 ?F (37.2 ?C) (03/28 0327) ?Temp Source: Oral (03/28 0327) ?BP: 136/82 (03/28 1245) ?Pulse Rate: 59 (03/28 1245) ? ?Labs: ?Recent Labs  ?  01/13/22 ?1958 01/13/22 ?2226 01/14/22 ?0412 01/14/22 ?1022  ?HGB 8.1*  --  7.4*  --   ?HCT 26.6*  --  24.3*  --   ?PLT 205  --  168  --   ?APTT  --   --  64*  --   ?LABPROT  --  71.4*  --  28.9*  ?INR  --  8.7*  --  2.7*  ?CREATININE 3.88*  --  3.66*  --   ?TROPONINIHS 10 9  --   --   ? ? ? ?Estimated Creatinine  Clearance: 27.5 mL/min (A) (by C-G formula based on SCr of 3.66 mg/dL (H)). ? ? ?Medical History: ?Past Medical History:  ?Diagnosis Date  ? Arthritis   ? BPH (benign prostatic hyperplasia) 11/24/2019  ? Chronic pain syndrome 11/24/2019  ? CKD (chronic kidney disease), stage IV ([place]) 11/24/2019  ? Diabetes mellitus without complication ([place])   ? Gout   ? Hypertension   ? Muscle pain   ? Physical deconditioning 11/24/2019  ? Scars   ? Swelling   ? ? ?Medications:  ?No current facility-administered medications on file prior to encounter.  ? ?Current Outpatient Medications on File Prior to Encounter  ?Medication Sig Dispense Refill  ? acetaminophen (TYLENOL) 650 MG CR tablet Take 1,300 mg by mouth every 8 (eight) hours as needed for pain.    ? allopurinol (ZYLOPRIM) 100 MG tablet Take 100 mg by mouth 2 (two) times daily.    ? amLODipine (NORVASC) 5 MG tablet Take 5 mg by mouth daily.    ? atorvastatin (LIPITOR) 80 MG tablet Take 80 mg by mouth daily.    ? bisacodyl (DULCOLAX) 10 MG  suppository Place 10 mg rectally daily as needed for mild constipation.    ? cloNIDine (CATAPRES) 0.1 MG tablet Take 0.1 mg by mouth 3 (three) times daily.    ? colchicine 0.6 MG tablet Take 0.6 mg by mouth daily as needed.    ? ferrous sulfate 325 (65 FE) MG tablet Take 1 tablet (325 mg total) by mouth daily with breakfast. 30 tablet 0  ? finasteride (PROSCAR) 5 MG tablet Take 1 tablet (5 mg total) by mouth daily. 30 tablet 0  ? furosemide (LASIX) 40 MG tablet Take 40 mg by mouth daily.    ? glipiZIDE (GLUCOTROL) 10 MG tablet Take 20 mg by mouth daily as needed (if BG > 120). only takes if BG > 120    ? lactulose (CHRONULAC) 10 GM/15ML solution Take by mouth 2 (two) times daily as needed (constipation).    ? levothyroxine (SYNTHROID) 50 MCG tablet Take 50 mcg by mouth daily before breakfast.     ? metFORMIN (GLUCOPHAGE) 500 MG tablet Take 500 mg by mouth 2 (two) times daily with a meal. Taking only if BG is > 200    ? metoprolol tartrate  (LOPRESSOR) 50 MG tablet Take 1 tablet (50 mg total) by mouth 2 (two) times daily. 60 tablet 1  ? NEURONTIN 300 MG capsule Take 300 mg by mouth 3 (three) times daily.    ? OXYCONTIN 30 MG 12 hr tablet Take 30 mg by mouth 3 (three) times daily as needed (pain).    ? pantoprazole (PROTONIX) 40 MG tablet Take 1 tablet by mouth 2 times daily. (Patient taking differently: Take 40 mg by mouth 2 (two) times daily as needed (acid reflux).) 60 tablet 0  ? polyethylene glycol (MIRALAX / GLYCOLAX) 17 g packet Take 17 g by mouth daily. (Patient taking differently: Take 17 g by mouth daily as needed for mild constipation.) 14 each 0  ? sodium bicarbonate 650 MG tablet Take 650 mg by mouth daily.    ? tamsulosin (FLOMAX) 0.4 MG CAPS capsule Take 1 capsule (0.4 mg total) by mouth daily. (Patient taking differently: Take 0.4 mg by mouth 2 (two) times daily.) 30 capsule 0  ? tiZANidine (ZANAFLEX) 4 MG tablet Take 4 mg by mouth every 8 (eight) hours as needed for muscle spasms.    ? warfarin (COUMADIN) 5 MG tablet Take 0.5 tablets (2.5 mg total) by mouth daily. (Patient taking differently: Take 7.5 mg by mouth daily at 4 PM.)    ? cefdinir (OMNICEF) 300 MG capsule Take 1 capsule (300 mg total) by mouth every 12 (twelve) hours. (Patient taking differently: Take 300 mg by mouth every 12 (twelve) hours. 3 DS) 6 capsule 0  ? gabapentin (NEURONTIN) 100 MG capsule Take 1 capsule (100 mg total) by mouth 3 (three) times daily. (Patient not taking: Reported on 01/14/2022) 30 capsule 3  ? naloxegol oxalate (MOVANTIK) 25 MG TABS tablet Take 25 mg by mouth daily. (Patient not taking: Reported on 01/14/2022)    ? sodium zirconium cyclosilicate (LOKELMA) 10 g PACK packet Take 10 g by mouth daily. (Patient not taking: Reported on 01/14/2022) 30 each 3  ?  ? ?Assessment: ?71 y.o. male admitted with acute on chronic kidney failure, h/o DVT and supratherapeutic INR. Pharmacy consulted to dose warfarin. INR 8.7 on admission and down to 2.7 after vitamin K  1mg  IV given. Prior to admission patient was intended to be on warfarin 2.5mg  daily after discharge but was taking warfarin 7.5mg  daily.  ? ?  Discussed INR down to 2.7 with Dr. Shanon Brow and okay to resume warfarin. RN notes slight blood from foot wound/scab, no other signs of bleeding. Patient denies blood in stool/urine. Hgb 7.4. plt wnl.  ? ?Goal of Therapy:  ?INR 2-3 ?Monitor platelets by anticoagulation protocol: Yes ?  ?Plan:  ?Warfarin 2.5mg  x1  ?Monitor daily INR and CBC ? ?Cristela Felt, PharmD, BCPS ?Clinical Pharmacist ?01/14/2022 1:36 PM ? ? ? ?

## 2022-01-14 NOTE — Assessment & Plan Note (Signed)
?   Continue home regimen of Flomax and finasteride ?

## 2022-01-14 NOTE — Assessment & Plan Note (Signed)
?   Notable wound on left heel, present on admission ?? We will place wound care consult for assistance in managing and dressing this wound ?

## 2022-01-14 NOTE — Plan of Care (Signed)
  Problem: Pain Managment: Goal: General experience of comfort will improve Outcome: Progressing   

## 2022-01-14 NOTE — Assessment & Plan Note (Addendum)
?   Coumadin currently supratherapeutic ?? Giving 1 mg of intravenous vitamin K per pharmacy recommendation. ?? We will resume once able to maintain INR between 2 and 3 ? ?

## 2022-01-14 NOTE — Assessment & Plan Note (Addendum)
?   No obvious clinical evidence of cardiogenic volume overload on exam ?? Vague patchy bilateral infiltrates on chest x-ray however unlikely to be due to cardiogenic volume overload.   ?? Will evaluate further with BNP, CRP, procalcitonin and CT chest ?

## 2022-01-14 NOTE — Consult Note (Signed)
I have been asked to see the patient by Dr. Steward Ros, for evaluation and management of bladder outlet obstruction. ? 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  In the emergency department his cardiac work-up was otherwise negative, and his labs demonstrated acute on chronic renal failure.  He was noted to have some bladder distention and a urine analysis concerning for possible urinary tract infection.  Foley catheter was placed, and urology was consulted for bladder outlet obstruction. ? ?The patient is not complaining of real significant urinary tract symptoms.  Baseline, he does have intermittent episodes of obstructive voiding symptoms, but finds that if he is drinking plenty of water his symptoms are good.  Prior to his presentation today he was not having any dysuria or gross hematuria.  He denies any incontinence. ? ?Review of systems: A 12 point comprehensive review of systems was obtained and is negative unless otherwise stated in the history of present illness. ? ?Patient Active Problem List  ? Diagnosis Date Noted  ? 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  ? Open wound of left heel 01/14/2022  ? Acute renal failure superimposed on stage 4 chronic kidney disease ([place]) 01/13/2022  ? Chest pain 01/13/2022  ? UTI (urinary tract infection) 12/31/2021  ? Chronic anticoagulation 12/31/2021  ? Prolonged QT interval 12/31/2021  ? Multifocal pneumonia 12/30/2021  ? SVT (supraventricular tachycardia) ([place]) 12/11/2021  ? Obesity, Class I, BMI 30-34.9 12/10/2021  ? Left leg cellulitis 12/07/2021  ? Hyperkalemia 10/09/2021  ? Hypothyroidism 10/09/2021  ? Hydronephrosis of right  kidney 10/09/2021  ? Hypoglycemia 10/07/2021  ? Deep vein thrombosis (DVT) of left lower extremity ([place]) 10/07/2021  ? Acute renal failure superimposed on chronic kidney disease ([place]) 09/17/2021  ? CAP (community acquired pneumonia) 09/17/2021  ? Cellulitis of left lower extremity   ? Pressure injury of skin 06/03/2021  ? AKI (acute kidney injury) ([place]) 06/02/2021  ? Pain management   ? Cord compression [facility])   ? Drug-induced constipation   ? Acute midline low back pain with sciatica   ? 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 Urinary retention 12/04/2019  ? BPH (benign prostatic hyperplasia) 11/24/2019  ? CKD (chronic kidney disease), stage IV ([place]) 11/24/2019  ? Chronic pain syndrome 11/24/2019  ? Physical deconditioning 11/24/2019  ? 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 Pulmonary emboli ([place]) 09/29/2018  ? [name], right ([place]) 09/29/2018  ? Type 2 diabetes mellitus with stage 4 chronic kidney disease, without long-term current use of insulin ([place]) 09/29/2018  ? Gout 09/29/2018  ? Essential hypertension 09/29/2018  ? Chronic combined systolic and diastolic congestive heart failure ([place]) 09/29/2018  ? GERD (gastroesophageal reflux disease) 09/29/2018  ? ? ?No current facility-administered medications on file prior to encounter.  ? ?Current Outpatient Medications on File Prior to Encounter  ?Medication Sig Dispense Refill  ? acetaminophen (TYLENOL) 650 MG CR tablet Take 1,300 mg by mouth every 8 (eight) hours as needed for pain.    ? allopurinol (ZYLOPRIM) 100 MG tablet Take 100 mg by mouth 2 (two) times daily.    ? amLODipine (NORVASC) 5 MG tablet Take 5 mg by mouth daily.    ? atorvastatin (LIPITOR) 80 MG tablet Take 80  mg by mouth daily.    ? bisacodyl (DULCOLAX) 10 MG suppository Place 10 mg rectally daily as needed for mild constipation.    ? cloNIDine (CATAPRES) 0.1 MG tablet Take 0.1 mg by mouth 3 (three) times daily.    ? colchicine 0.6 MG tablet Take 0.6 mg by mouth  daily as needed.    ? ferrous sulfate 325 (65 FE) MG tablet Take 1 tablet (325 mg total) by mouth daily with breakfast. 30 tablet 0  ? finasteride (PROSCAR) 5 MG tablet Take 1 tablet (5 mg total) by mouth daily. 30 tablet 0  ? furosemide (LASIX) 40 MG tablet Take 40 mg by mouth daily.    ? glipiZIDE (GLUCOTROL) 10 MG tablet Take 20 mg by mouth daily as needed (if BG > 120). only takes if BG > 120    ? lactulose (CHRONULAC) 10 GM/15ML solution Take by mouth 2 (two) times daily as needed (constipation).    ? levothyroxine (SYNTHROID) 50 MCG tablet Take 50 mcg by mouth daily before breakfast.     ? metFORMIN (GLUCOPHAGE) 500 MG tablet Take 500 mg by mouth 2 (two) times daily with a meal. Taking only if BG is > 200    ? metoprolol tartrate (LOPRESSOR) 50 MG tablet Take 1 tablet (50 mg total) by mouth 2 (two) times daily. 60 tablet 1  ? NEURONTIN 300 MG capsule Take 300 mg by mouth 3 (three) times daily.    ? OXYCONTIN 30 MG 12 hr tablet Take 30 mg by mouth 3 (three) times daily as needed (pain).    ? pantoprazole (PROTONIX) 40 MG tablet Take 1 tablet by mouth 2 times daily. (Patient taking differently: Take 40 mg by mouth 2 (two) times daily as needed (acid reflux).) 60 tablet 0  ? polyethylene glycol (MIRALAX / GLYCOLAX) 17 g packet Take 17 g by mouth daily. (Patient taking differently: Take 17 g by mouth daily as needed for mild constipation.) 14 each 0  ? sodium bicarbonate 650 MG tablet Take 650 mg by mouth daily.    ? tamsulosin (FLOMAX) 0.4 MG CAPS capsule Take 1 capsule (0.4 mg total) by mouth daily. (Patient taking differently: Take 0.4 mg by mouth 2 (two) times daily.) 30 capsule 0  ? tiZANidine (ZANAFLEX) 4 MG tablet Take 4 mg by mouth every 8 (eight) hours as needed for muscle spasms.    ? warfarin (COUMADIN) 5 MG tablet Take 0.5 tablets (2.5 mg total) by mouth daily. (Patient taking differently: Take 7.5 mg by mouth daily at 4 PM.)    ? cefdinir (OMNICEF) 300 MG capsule Take 1 capsule (300 mg total) by mouth  every 12 (twelve) hours. (Patient taking differently: Take 300 mg by mouth every 12 (twelve) hours. 3 DS) 6 capsule 0  ? gabapentin (NEURONTIN) 100 MG capsule Take 1 capsule (100 mg total) by mouth 3 (three) times daily. (Patient not taking: Reported on 01/14/2022) 30 capsule 3  ? naloxegol oxalate (MOVANTIK) 25 MG TABS tablet Take 25 mg by mouth daily. (Patient not taking: Reported on 01/14/2022)    ? sodium zirconium cyclosilicate (LOKELMA) 10 g PACK packet Take 10 g by mouth daily. (Patient not taking: Reported on 01/14/2022) 30 each 3  ? ? ?Past Medical History:  ?Diagnosis Date  ? Arthritis   ? BPH (benign prostatic hyperplasia) 11/24/2019  ? Chronic pain syndrome 11/24/2019  ? CKD (chronic kidney disease), stage IV ([place]) 11/24/2019  ? Diabetes mellitus without complication ([place])   ? Gout   ?  Hypertension   ? Muscle pain   ? Physical deconditioning 11/24/2019  ? Scars   ? Swelling   ? ? ?Past Surgical History:  ?Procedure Laterality Date  ? BELOW KNEE LEG AMPUTATION Right   ? ESOPHAGOGASTRODUODENOSCOPY (EGD) WITH PROPOFOL N/A 11/25/2019  ? Procedure: ESOPHAGOGASTRODUODENOSCOPY (EGD) WITH PROPOFOL;  Surgeon: Carol Ada, MD;  Location: [place];  Service: Endoscopy;  Laterality: N/A;  ? IR FLUORO GUIDE CV LINE RIGHT  09/05/2019  ? IR LUMBAR DISC ASPIRATION W/IMG GUIDE  11/26/2019  ? IR US GUIDE VASC ACCESS RIGHT  09/05/2019  ? RADIOLOGY WITH ANESTHESIA N/A 11/25/2019  ? Procedure: Thoracic and Lumbar MRI WITH ANESTHESIA;  Surgeon: Radiologist, Medication, MD;  Location: [place];  Service: Radiology;  Laterality: N/A;  ? ? ?Social History  ? ?Tobacco Use  ? Smoking status: Never  ?  Passive exposure: Never  ? Smokeless tobacco: Never  ?Vaping Use  ? Vaping Use: Never used  ?Substance Use Topics  ? Alcohol use: No  ? Drug use: No  ? ? ?Family History  ?Problem Relation Age of Onset  ? Heart disease Mother   ? Cancer Father   ? ? ?PE: ?Vitals:  ? 01/14/22 1600 01/14/22 1700 01/14/22 1800 01/14/22 1831  ?BP: (!) 144/78 (!)  153/84 (!) 150/81 (!) 144/95  ?Pulse: (!) 57 (!) 58 (!) 58 65  ?Resp: 11 13 (!) 9 18  ?Temp: 98 ?F (36.7 ?C)  98.1 ?F (36.7 ?C) 98.1 ?F (36.7 ?C)  ?TempSrc: Oral  Oral Oral  ?SpO2: 95% 90% 95% 96%  ?Weight:

## 2022-01-14 NOTE — Consult Note (Signed)
[place] Nurse Consult Note: ?Patient receiving care in [facility] ED007 ?ABI performed on 12/09/21: Resting left ankle-brachial index is within normal range. No evidence of significant left lower ?extremity arterial disease ?Reason for Consult: left heel wound ?Wound type: puncture wound on the plantar aspect of the left foot.  ?Callus formation on the sulcus region of the foot.  ?Open wound on the anterior side of the LLE. The entire LLE is dry, flaky and has darkened dry patches that could easily peel off.  ?Toes of the left foot are somewhat deformed and dry with some scabbed areas.  ?Pressure Injury POA: NA ?Measurement: open wound on the anterior LLE 3 cm x 2 cm x 0.2 cm and is 100% red ?Drainage (amount, consistency, odor) puncture wound on the plantar aspect of the foot bleeding. ?Periwound: Erythema from the knee down to the ankle  ?Dressing procedure/placement/frequency: ?Clean the LLE including the foot and in between the toes with Dial Anti-bacterial soap (in patient belongings), rinse and pat dry. Apply a cut to fit piece of Xeroform gauze over the open wound on the LLE and secure with a foam dressing. Place a foam dressing over the plantar wound on the foot. Then apply Sween moisturizing lotion (pink top in clean supply) over the entire LLE. Wrap the LLE with Kerlix and Coban Kellie Simmering # [phone]). Change dressing on Tues and Friday. Place left foot in a Prevalon boot Kellie Simmering # [phone]) ? ?Monitor the wound area(s) for worsening of condition such as: ?Signs/symptoms of infection, increase in size, development of or worsening of odor, ?development of pain, or increased pain at the affected locations.   ?Notify the medical team if any of these develop. ? ?Thank you for the consult. [place] nurse will not follow at this time.   ?Please re-consult the [place] team if needed. ? ?Cathlean [place]seilles. Tamala Julian, MSN, RN, CMSRN, AGCNS, WTA ?Wound Treatment Associate ?Pager [phone]   ? ? ?  ?

## 2022-01-14 NOTE — Assessment & Plan Note (Signed)
.   Resume patients home regimen of oral antihypertensives . Titrate antihypertensive regimen as necessary to achieve adequate BP control . PRN intravenous antihypertensives for excessively elevated blood pressure   

## 2022-01-14 NOTE — ED Notes (Signed)
Bladder scan performed post void, 620 mL residual ?

## 2022-01-14 NOTE — Assessment & Plan Note (Addendum)
?   Patient complained of atypical chest discomfort earlier in the day on 3/27 while at rest ?? I believe the etiology is musculoskeletal  ?? Patient is currently chest pain-free ?? 2 sets of cardiac enzymes unremarkable ?? EKG shows no dynamic ST segment change ?? Continue to monitor patient on telemetry ? ?

## 2022-01-15 DIAGNOSIS — N179 Acute kidney failure, unspecified: Secondary | ICD-10-CM | POA: Diagnosis not present

## 2022-01-15 DIAGNOSIS — N184 Chronic kidney disease, stage 4 (severe): Secondary | ICD-10-CM | POA: Diagnosis not present

## 2022-01-15 LAB — BASIC METABOLIC PANEL
Anion gap: 10 (ref 5–15)
BUN: 47 mg/dL — ABNORMAL HIGH (ref 8–23)
CO2: 23 mmol/L (ref 22–32)
Calcium: 7.5 mg/dL — ABNORMAL LOW (ref 8.9–10.3)
Chloride: 107 mmol/L (ref 98–111)
Creatinine, Ser: 3.26 mg/dL — ABNORMAL HIGH (ref 0.61–1.24)
GFR, Estimated: 20 mL/min — ABNORMAL LOW (ref >60)
Glucose, Bld: 109 mg/dL — ABNORMAL HIGH (ref 70–99)
Potassium: 4.9 mmol/L (ref 3.5–5.1)
Sodium: 140 mmol/L (ref 135–145)

## 2022-01-15 LAB — IRON AND TIBC
Iron: 31 ug/dL — ABNORMAL LOW (ref 45–182)
Saturation Ratios: 14 % — ABNORMAL LOW (ref 17.9–39.5)
TIBC: 224 ug/dL — ABNORMAL LOW (ref 250–450)
UIBC: 193 ug/dL

## 2022-01-15 LAB — CBC
HCT: 26.2 % — ABNORMAL LOW (ref 39.0–52.0)
Hemoglobin: 8.2 g/dL — ABNORMAL LOW (ref 13.0–17.0)
MCH: 31.5 pg (ref 26.0–34.0)
MCHC: 31.3 g/dL (ref 30.0–36.0)
MCV: 100.8 fL — ABNORMAL HIGH (ref 80.0–100.0)
Platelets: 178 10*3/uL (ref 150–400)
RBC: 2.6 MIL/uL — ABNORMAL LOW (ref 4.22–5.81)
RDW: 15.3 % (ref 11.5–15.5)
WBC: 5.5 10*3/uL (ref 4.0–10.5)
nRBC: 0 % (ref 0.0–0.2)

## 2022-01-15 LAB — PROTIME-INR
INR: 1.5 — ABNORMAL HIGH (ref 0.8–1.2)
Prothrombin Time: 18.5 seconds — ABNORMAL HIGH (ref 11.4–15.2)

## 2022-01-15 LAB — UREA NITROGEN, URINE: Urea Nitrogen, Ur: 293 mg/dL

## 2022-01-15 LAB — FERRITIN: Ferritin: 40 ng/mL (ref 24–336)

## 2022-01-15 LAB — GLUCOSE, CAPILLARY
Glucose-Capillary: 96 mg/dL (ref 70–99)
Glucose-Capillary: 133 mg/dL — ABNORMAL HIGH (ref 70–99)
Glucose-Capillary: 115 mg/dL — ABNORMAL HIGH (ref 70–99)
Glucose-Capillary: 110 mg/dL — ABNORMAL HIGH (ref 70–99)

## 2022-01-15 MED ORDER — ATORVASTATIN CALCIUM 80 MG PO TABS
80.0000 mg | ORAL_TABLET | Freq: Every day | ORAL | Status: DC
  Administered 2022-01-15 – 2022-01-18 (×4): 80 mg via ORAL
  Filled 2022-01-15 (×4): qty 1

## 2022-01-15 MED ORDER — GABAPENTIN 100 MG PO CAPS
100.0000 mg | ORAL_CAPSULE | Freq: Three times a day (TID) | ORAL | Status: DC
  Administered 2022-01-15 – 2022-01-18 (×10): 100 mg via ORAL
  Filled 2022-01-15 (×10): qty 1

## 2022-01-15 MED ORDER — SODIUM BICARBONATE 650 MG PO TABS
650.0000 mg | ORAL_TABLET | Freq: Every day | ORAL | Status: DC
  Administered 2022-01-15 – 2022-01-18 (×4): 650 mg via ORAL
  Filled 2022-01-15 (×4): qty 1

## 2022-01-15 MED ORDER — WARFARIN SODIUM 4 MG PO TABS
4.0000 mg | ORAL_TABLET | Freq: Once | ORAL | Status: AC
  Administered 2022-01-15: 4 mg via ORAL
  Filled 2022-01-15: qty 1

## 2022-01-15 MED ORDER — DARBEPOETIN ALFA 200 MCG/0.4ML IJ SOSY
200.0000 ug | PREFILLED_SYRINGE | INTRAMUSCULAR | Status: DC
  Administered 2022-01-15: 200 ug via SUBCUTANEOUS
  Filled 2022-01-15: qty 0.4

## 2022-01-15 MED ORDER — AMLODIPINE BESYLATE 5 MG PO TABS
5.0000 mg | ORAL_TABLET | Freq: Every day | ORAL | Status: DC
  Administered 2022-01-15 – 2022-01-17 (×3): 5 mg via ORAL
  Filled 2022-01-15 (×3): qty 1

## 2022-01-15 MED ORDER — GABAPENTIN 300 MG PO CAPS
300.0000 mg | ORAL_CAPSULE | Freq: Three times a day (TID) | ORAL | Status: DC
Start: 2022-01-15 — End: 2022-01-15
  Administered 2022-01-15: 300 mg via ORAL
  Filled 2022-01-15: qty 1

## 2022-01-15 NOTE — Progress Notes (Signed)
ANTICOAGULATION CONSULT NOTE - Follow Up Consult ? ?Pharmacy Consult for Warfarin ?Indication: hx DVT ? ?Patient Measurements: ?Height: 6\' 5"  (195.6 cm) ?Weight: 124.3 kg (274 lb 0.5 oz) ?IBW/kg (Calculated) : 89.1 ? ?Vital Signs: ?Temp: 98.9 ?F (37.2 ?C) (03/29 0434) ?Temp Source: Oral (03/29 0434) ?BP: 171/86 (03/29 6812) ?Pulse Rate: 60 (03/29 0619) ? ?Labs: ?Recent Labs  ?  01/13/22 ?1958 01/13/22 ?2226 01/14/22 ?0412 01/14/22 ?1022 01/15/22 ?0410  ?HGB 8.1*  --  7.4*  --  8.2*  ?HCT 26.6*  --  24.3*  --  26.2*  ?PLT 205  --  168  --  178  ?APTT  --   --  64*  --   --   ?LABPROT  --  71.4*  --  28.9* 18.5*  ?INR  --  8.7*  --  2.7* 1.5*  ?CREATININE 3.88*  --  3.66*  --  3.26*  ?TROPONINIHS 10 9  --   --   --   ? ? ?Estimated Creatinine Clearance: 31.7 mL/min (A) (by C-G formula based on SCr of 3.26 mg/dL (H)). ? ?Assessment: ?71 y.o. male admitted 01/13/22 with acute on chronic kidney failure, h/o DVT and supratherapeutic INR. Pharmacy consulted to dose warfarin.  ? ? INR 8.7 on admit 3/27 and Vitamin K 1 mg IV given. INR down to 2.7 on 3/28 and warfarin 2.5 mg given. INR has trended down further to 1.5 today. ? ? Prior to admission, patient was intended to be on warfarin 2.5 mg daily after discharge 01/03/22, but was taking warfarin 7.5 mg daily.  ? ?Goal of Therapy:  ?INR 2-3 ?Monitor platelets by anticoagulation protocol: Yes ?  ?Plan:  ?Warfarin 4 mg x 1 today. ?Daily PT/INR. ? ?Arty Baumgartner, RPh ?01/15/2022,12:36 PM ? ? ?

## 2022-01-15 NOTE — TOC Transition Note (Signed)
Transition of Care (TOC) - CM/SW Discharge Note ? ? ?Patient Details  ?Name: Thomas [name] ?MRN: [id number] ?Date of Birth: 1951/05/26 ? ?Transition of Care (TOC) CM/SW Contact:  ?[name], Renea Ee, RN ?Phone Number: ?01/15/2022, 3:06 PM ? ? ?Clinical Narrative:    ? ?CM spoke with patient at bedside about needs for post hospital transition. 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 From home alone. Patient was recently discharged from this hospital. Does not have any children. Has seven siblings. Currently on disability. Has a rt BKA and uses prosthesis. Has a cane, walker, crutches at home.  ?Active with [facility] for home health PT/OT/RN disciplines and patient requests to continue with their disciplines. CM notified Delsa Sale and voiced acceptance. Information on AVS.  ?PCP is Antonietta Jewel, MD with Internal Medicine and uses [facility] in [place]. ?Brother to transport at discharge. CM will continue to follow with needs.  ? ? ? ?Final next level of care: [place] ?Barriers to Discharge: Continued Medical Work up ? ? ?Patient Goals and CMS Choice ?Patient states their goals for this hospitalization and ongoing recovery are:: To return home ?CMS Medicare.gov Compare Post Acute Care list provided to:: Patient ?Choice offered to / list presented to : Patient ? ?Discharge Placement ?  ?           ?  ?  ?  ?  ? ?Discharge Plan and Services ?  ?Discharge Planning Services: CM Consult ?Post Acute Care Choice: Home Health          ?  ?  ?  ?  ?  ?HH Arranged: RN, PT, OT ?[place] Agency: [facility] ?Date HH Agency Contacted: 01/15/22 ?Time [place]: 7616 ?Representative spoke with at [place]: Delsa Sale ? ?Social Determinants of Health (SDOH) Interventions ?  ? ? ?Readmission Risk Interventions ? ?  01/15/2022  ?  2:56 PM 01/02/2022  ?  2:03 PM 12/31/2021  ? 10:32 AM  ?Readmission Risk Prevention Plan  ?Transportation Screening Complete Complete Complete  ?Medication Review Press photographer) Complete  Complete Complete  ?PCP or Specialist appointment within 3-5 days of discharge Complete Complete Complete  ?[place] or Home Care Consult Complete Complete Complete  ?SW Recovery Care/Counseling Consult Complete Complete Complete  ?Palliative Care Screening Not Applicable Not Applicable Not Applicable  ?[place] Not Applicable Not Applicable Not Applicable  ? ? ? ? ? ?

## 2022-01-15 NOTE — Evaluation (Signed)
Physical Therapy Evaluation ?Patient Details ?Name: Thomas [name] ?MRN: [id number] ?DOB: [birth year] ?Today's Date: 01/15/2022 ? ?History of Present Illness ? Patient is a 70 year old male who presented to the hospital via EMS with generalized weakness and SOB. Pt admitted for acute renal failure superimposed on stage 4 chronic kidney disease. Two recent hospitalizations with most recent d/c on 01/03/22. PMH: chronic pain, back surgeries, DVT, PE, DM, CKD, R BKA, HR, gout, hyperhalemia, AKI, chronic hydronephrosis. ?  ?Clinical Impression ? Pt admitted with above diagnosis. PTA pt lived at home alone, active with HHPT since recent d/c from hospital. Pt currently with functional limitations due to the deficits listed below (see PT Problem List). On eval, pt demonstrated mod I bed mobility and min assist sit to stand with RW and R prosthesis. Declining transfer to recliner or ambulation due to need for BM. Pt prefers independent use of bed pan in bed, instead of BSC or toilet. Pt will benefit from skilled PT to increase their independence and safety with mobility to allow discharge to the venue listed below.   ?   ?   ? ?Recommendations for follow up therapy are one component of a multi-disciplinary discharge planning process, led by the attending physician.  Recommendations may be updated based on patient status, additional functional criteria and insurance authorization. ? ?Follow Up Recommendations Home health PT ? ?  ?Assistance Recommended at Discharge Intermittent Supervision/Assistance  ?Patient can return home with the following ? A little help with walking and/or transfers;A little help with bathing/dressing/bathroom;Assist for transportation;Help with stairs or ramp for entrance ? ?  ?Equipment Recommendations None recommended by PT  ?Recommendations for Other Services ?    ?  ?Functional Status Assessment Patient has had a recent decline in their functional status and demonstrates the ability to make  significant improvements in function in a reasonable and predictable amount of time.  ? ?  ?Precautions / Restrictions Precautions ?Precautions: Fall ?Precaution Comments: R BKA ?Restrictions ?Other Position/Activity Restrictions: dressing in place L distal LE. Pt reports donning gripper sock over dressing for mobility  ? ?  ? ?Mobility ? Bed Mobility ?Overal bed mobility: Modified Independent ?  ?  ?  ?  ?  ?  ?General bed mobility comments: +rail, increased time ?  ? ?Transfers ?Overall transfer level: Needs assistance ?Equipment used: Rolling walker (2 wheels) ?Transfers: Sit to/from Stand ?Sit to Stand: Min assist, From elevated surface ?  ?  ?  ?  ?  ?General transfer comment: Required quick return to bed due to need for BM. Pt declining transfer to [facility] stating he independently manages bed pan in bed. ?  ? ?Ambulation/Gait ?  ?  ?  ?  ?  ?  ?  ?General Gait Details: deferred due to need for BM ? ?Stairs ?  ?  ?  ?  ?  ? ?Wheelchair Mobility ?  ? ?Modified Rankin (Stroke Patients Only) ?  ? ?  ? ?Balance Overall balance assessment: Needs assistance ?Sitting-balance support: Feet supported, No upper extremity supported ?Sitting balance-[name]: Good ?  ?  ?Standing balance support: During functional activity, Reliant on assistive device for balance, Bilateral upper extremity supported ?Standing balance-[name]: Poor ?  ?  ?  ?  ?  ?  ?  ?  ?  ?  ?  ?  ?   ? ? ? ?Pertinent Vitals/Pain Pain Assessment ?Pain Assessment: Faces ?Faces Pain Scale: Hurts little more ?Pain Location: back (chronic) ?Pain Descriptors /  Indicators: Discomfort ?Pain Intervention(s): Monitored during session, Repositioned  ? ? ?Home Living Family/patient expects to be discharged to:: Private residence ?Living Arrangements: Alone ?Available Help at Discharge: Available PRN/intermittently ?Type of Home: House ?Home Access: Stairs to enter ?Entrance Stairs-Rails: None ?Entrance Stairs-Number of Steps: 1 ?  ?Home Layout: One level ?Home  Equipment: Conservation officer, nature (2 wheels);Cane - single point;Crutches ?   ?  ?Prior Function Prior Level of Function : Needs assist ?  ?  ?  ?  ?  ?  ?Mobility Comments: amb household distances with RW and RLE prosthesis ?ADLs Comments: prefers sink baths ?  ? ? ?Hand Dominance  ? Dominant Hand: Right ? ?  ?Extremity/Trunk Assessment  ? Upper Extremity Assessment ?Upper Extremity Assessment: Defer to OT evaluation ?  ? ?Lower Extremity Assessment ?Lower Extremity Assessment: Generalized weakness ?  ? ?Cervical / Trunk Assessment ?Cervical / Trunk Assessment: Normal  ?Communication  ? Communication: No difficulties  ?Cognition Arousal/Alertness: Awake/alert ?Behavior During Therapy: [facility] for tasks assessed/performed ?Overall Cognitive Status: Impaired/Different from baseline ?Area of Impairment: Safety/judgement, Problem solving ?  ?  ?  ?  ?  ?  ?  ?  ?  ?  ?  ?  ?Safety/Judgement: Decreased awareness of safety, Decreased awareness of deficits ?  ?Problem Solving: Requires verbal cues ?General Comments: Poor insight into medical status. ?  ?  ? ?  ?General Comments   ? ?  ?Exercises    ? ?Assessment/Plan  ?  ?PT Assessment Patient needs continued PT services  ?PT Problem List Decreased strength;Decreased mobility;Decreased activity tolerance;Decreased balance;Decreased knowledge of use of DME;Pain;Obesity ? ?   ?  ?PT Treatment Interventions DME instruction;Gait training;Therapeutic exercise;Balance training;Therapeutic activities;Patient/family education;Functional mobility training   ? ?PT Goals (Current goals can be found in the Care Plan section)  ?Acute Rehab PT Goals ?Patient Stated Goal: home ?PT Goal Formulation: With patient ?Time For Goal Achievement: 01/29/22 ?Potential to Achieve Goals: Fair ? ?  ?Frequency Min 3X/week ?  ? ? ?Co-evaluation   ?  ?  ?  ?  ? ? ?  ?AM-PAC PT "6 Clicks" Mobility  ?Outcome Measure Help needed turning from your back to your side while in a flat bed without using bedrails?:  None ?Help needed moving from lying on your back to sitting on the side of a flat bed without using bedrails?: None ?Help needed moving to and from a bed to a chair (including a wheelchair)?: A Little ?Help needed standing up from a chair using your arms (e.g., wheelchair or bedside chair)?: A Little ?Help needed to walk in hospital room?: A Lot ?Help needed climbing 3-5 steps with a railing? : A Lot ?6 Click Score: 18 ? ?  ?End of Session Equipment Utilized During Treatment: Gait belt ?Activity Tolerance: Patient tolerated treatment well ?Patient left: in bed;with call bell/phone within reach;with bed alarm set ?Nurse Communication: Mobility status ?PT Visit Diagnosis: Muscle weakness (generalized) (M62.81);Difficulty in walking, not elsewhere classified (R26.2) ?  ? ?Time: 3354-5625 ?PT Time Calculation (min) (ACUTE ONLY): 14 min ? ? ?Charges:   PT Evaluation ?$PT Eval Moderate Complexity: 1 Mod ?  ?  ?   ? ? ?Lorrin Goodell, PT  ?Office # [phone] ?Pager [phone] ? ? ?Lorriane Shire ?01/15/2022, 10:33 AM ? ?

## 2022-01-15 NOTE — Progress Notes (Signed)
?Progress note ? ?HPI:  ? 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 ? ?Of note, patient was recently hospitalized at [facility] long hospital from 3/13 until 3/17.  Patient was felt to be suffering from left lower extremity cellulitis and multifocal pneumonia.  Patient was treated with intravenous antibiotics which was later transitioned to oral Omnicef therapy at time of discharge.   Patient also found to have acute kidney injury superimposed on his chronic kidney disease with creatinine peaking at 4.47 on arrival and downtrending to 2.3 at time of discharge.  Patient was discharged on 3/17. ? ?Patient explains that for the past few days since his recent discharge he has developed progressively worsening generalized weakness with increasing shortness of breath.  Patient is also complaining of associated left lower extremity edema.  Patient denies any fevers, sick contacts, recent travel or contact with confirmed COVID-19 infection. ? ?As patient's symptoms continue to worsen he has been also experiencing episodic chest discomfort.  Earlier in the day on 3/27, patient's home health nurse came to visit him to dress the wounds on his left lower extremity when he suddenly began complaining of chest pain while sitting in his recliner.  Nurse contacted EMS who promptly came to evaluate the patient and brought him into [facility] emergency department for evaluation.  Patient declined  aspirin in route. ? ?Upon evaluation in the emergency department patient was found once again found to have acute kidney injury superimposed on chronic kidney disease with creatinine of 3.88.  Patient was also noted to have mild hyperkalemia 5.7.  EKG revealed no dynamic ST segment change and troponin was unremarkable at 10.  Hospitalist group was called to assess the patient for admission to the hospital. ? ? ? ?Physical Exam: ? ?Vitals:  ? 01/14/22 1831 01/14/22 2107 01/15/22 0434 01/15/22 2297  ?BP: (!) 144/95 (!) 148/74 (!) 161/83 (!) 171/86  ?Pulse: 65 (!) 59 (!) 57 60  ?Resp: 18 18 18    ?Temp: 98.1 ?F (36.7 ?C) 98 ?F (36.7 ?C) 98.9 ?F (37.2 ?C)   ?TempSrc: Oral Oral Oral   ?SpO2: 96% 94% 95%   ?Weight: 124.3 kg     ?Height: 6\' 5"  (1.956 m)     ? ? ?Constitutional: Awake alert and oriented x3,  ?Skin: Unstageable wound on left heel, present on admission.  Otherwise, no rashes, no lesions, good skin turgor noted. ?Eyes: Pupils are equally reactive to light.  No evidence of scleral icterus or conjunctival pallor.  ?ENMT: Moist mucous membranes noted.  Posterior pharynx clear of any exudate or lesions.   ?Neck: normal, supple, no masses, no thyromegaly.  No evidence of jugular venous distension.   ?Respiratory: clear to auscultation bilaterally, no wheezing, no crackles. Normal respiratory effort. No accessory muscle use.  ?Cardiovascular: Regular rate and rhythm, no murmurs / rubs / gallops. No extremity edema. 2+ pedal pulses. No carotid bruits.  ?Chest:   Generalized anterior chest wall tenderness without crepitus or deformity.   ?Back:   Notable tenderness along the cervical spine without crepitus or deformity. ?Abdomen: Abdomen  is soft and nontender.  No evidence of intra-abdominal masses.  Positive bowel sounds noted in all quadrants.   ?Musculoskeletal: Right BKA.  No joint deformity upper and lower extremities. Good ROM, no contractures. Normal muscle tone.  ?Neurologic: Intermittent jerking of the  extremities throughout the entire interview and exam.  CN 2-12 grossly intact. Sensation intact.  Patient moving all 4 extremities spontaneously.  Patient is following all commands.  Patient is responsive to verbal stimuli.   ?Psychiatric: Patient exhibits anxious mood with appropriate affect.  Patient seems to possess insight as to their current situation.   ? ?Data Reviewed: ? ?I have personally reviewed and interpreted labs, imaging. ? ?Significant findings are BNP 292, troponin 10, creatinine 3.88, BUN 62, potassium 5.4 hemoglobin 8.1, white blood cell count 5.3, COVID-19 PCR testing pending. ? ?Chest x-ray personally reviewed revealing extremely patchy bilateral infiltrates ? ?EKG: Personally reviewed.  Rhythm is normal sinus rhythm with heart rate of 60 bpm.  QTc of 490 ms. no dynamic ST segment changes appreciated. ? ? ?Assessment and Plan: ?* Acute renal failure superimposed on stage 4 chronic kidney disease ([place]) ?Patient presenting with recurrent acute kidney injury of unclear etiology ?Creatinine currently 3.88, up from baseline of 2.3 at time of recent discharge from the hospital ?Gentle intravenous hydration ?Strict input and output monitoring ?Avoiding nephrotoxic agents ?Monitoring renal function and electrolytes with serial chemistries ?Imaging consistent with bladder outlet obstruction, urology consult obtained ?Also obtain nephrology consultation today does not appear all of renal failure secondary to bladder outlet obstruction per urology evaluation ? ? ?Chest pain ?Patient complained of atypical chest discomfort earlier in the day on 3/27 while at rest ?Likely etiology is musculoskeletal  ?Patient is currently chest pain-free ?2 sets of cardiac enzymes unremarkable ?EKG shows no dynamic ST segment change ?Continue to monitor patient on telemetry ? ? ?Gabapentin-induced toxicity ?Patient complaining of a 2-day history of intermittent jerking of the extremities ?After examining the patient I  believe this is likely secondary to gabapentin toxicity due to decreased gabapentin clearance from renal injury ?Temporarily holding gabapentin ?Gabapentin can be resumed once renal injury resolves ?Jerking should therefore be self-limiting ? ?Hyperkalemia ?Patient exhibiting mild hyperkalemia, likely secondary to diminished renal function ?No evidence of impact of hyperkalemia on EKG ?Providing patient with a dose of Lokelma ?Monitoring renal function and electrolytes with serial chemistries ?Monitoring patient on telemetry ? ? ?Deep vein thrombosis (DVT) of left lower extremity (HCC) ?Coumadin currently supratherapeutic ?Giving 1 mg of intravenous vitamin K per pharmacy recommendation. ?We will resume once able to maintain INR between 2 and 3 ? ? ?Type 2 diabetes mellitus with stage 4 chronic kidney disease, without long-term current use of insulin ([place]) ?Patient been placed on Accu-Cheks before every meal and nightly with sliding scale insulin ?Holding home regimen of hypoglycemics ?Hemoglobin A1c 4.8% earlier in the month ?Diabetic Diet ? ?Chronic combined systolic and diastolic congestive heart failure ([place]) ?No obvious clinical evidence of cardiogenic volume overload on exam ?Vague patchy bilateral infiltrates on chest x-ray however unlikely to be due to cardiogenic volume overload.   ?Will evaluate further with BNP, CRP, procalcitonin and CT chest ? ?Essential hypertension ?Resume patients home regimen of oral antihypertensives ?Titrate antihypertensive regimen as necessary to achieve adequate BP control ?PRN intravenous antihypertensives for excessively elevated blood pressure ? ?Open wound of left heel ?Notable wound on left heel, present on admission ?We will place wound care consult for assistance in managing and dressing this wound ? ?Hypothyroidism ?Resume home regimen  of Synthroid ? ?BPH (benign prostatic hyperplasia) ?Continue home regimen of Flomax and finasteride ? ?Obtain physical therapy  evaluation to see if he could benefit from short-term rehab. ? ? ?Carlester Kasparek A MD ? ?01/15/2022 11:17 AM ?

## 2022-01-15 NOTE — Consult Note (Signed)
[facility] ?Renal Consultation Note ? ?Requesting MD: Shanon Brow ?Indication for Consultation: CKD ? ?HPI:  ?Thomas [name] is a 71 y.o. male with DM, biventricular heart failure, gout, prior CVA , s/p right BKA and DVT.  He also has CKD-  followed by a nephrologist in [facility]-  DR. Adegoroye  his crt has been over 2 for quite some time. He has had  hosp  2/18 - 2/26,  3/1 to 3/7, then 3/13 to 3/17.  During the hosp 3/1-3/7 nephrology saw for A on CRF- crt peaked at 5.96 but improved to 3 on 3/7.  The etiology of his A on CRF was thought to be due to urinary retention +/- hemodynamic issues-  it was recommended to continue foley drainage.  He apparently had stated that he would not want dialysis.  During second hosp-  again had A on CRF peak at 4.47 but down to 2.3 at discharge on 3/17.  Not He is now in the hospital again-  presented with CP-  presenting crt 3.88-  new u/s showing hydronephrosis of right kidney and distended bladder-  foley was placed-  crt down to 3.26 today.  Blood pressure if anything is high-  no nephrotoxins listed on med list.  He is difficult to get a history from.  Urinalysis showing 100 of protein -  red and white blood cells -  similar to previous u/as ? ?Creat  ?Date/Time Value Ref Range Status  ?01/23/2020 02:30 PM 5.99 (H) 0.70 - 1.25 mg/dL Final  ?  Comment:  ?  For patients >51 years of age, the reference limit ?for Creatinine is approximately 13% higher for people ?identified as African-American. ?. ?  ? ?Creatinine, Ser  ?Date/Time Value Ref Range Status  ?01/15/2022 04:10 AM 3.26 (H) 0.61 - 1.24 mg/dL Final  ?01/14/2022 04:12 AM 3.66 (H) 0.61 - 1.24 mg/dL Final  ?01/13/2022 07:58 PM 3.88 (H) 0.61 - 1.24 mg/dL Final  ?01/03/2022 03:45 AM 2.30 (H) 0.61 - 1.24 mg/dL Final  ?01/02/2022 03:36 AM 2.72 (H) 0.61 - 1.24 mg/dL Final  ?01/01/2022 03:32 AM 3.19 (H) 0.61 - 1.24 mg/dL Final  ?12/31/2021 03:28 AM 4.19 (H) 0.61 - 1.24 mg/dL Final  ?12/30/2021 06:10 PM 4.47 (H) 0.61  - 1.24 mg/dL Final  ?12/24/2021 05:48 AM 3.01 (H) 0.61 - 1.24 mg/dL Final  ?12/23/2021 08:57 AM 3.24 (H) 0.61 - 1.24 mg/dL Final  ?12/22/2021 02:44 AM 3.88 (H) 0.61 - 1.24 mg/dL Final  ?12/21/2021 02:48 AM 4.53 (H) 0.61 - 1.24 mg/dL Final  ?12/20/2021 02:37 AM 5.13 (H) 0.61 - 1.24 mg/dL Final  ?12/19/2021 05:30 PM 5.55 (H) 0.61 - 1.24 mg/dL Final  ?12/19/2021 07:11 AM 5.44 (H) 0.61 - 1.24 mg/dL Final  ?12/19/2021 05:43 AM 5.55 (H) 0.61 - 1.24 mg/dL Final  ?12/19/2021 05:43 AM 5.51 (H) 0.61 - 1.24 mg/dL Final  ?12/19/2021 01:51 AM 5.96 (H) 0.61 - 1.24 mg/dL Final  ?12/18/2021 09:55 PM 5.95 (H) 0.61 - 1.24 mg/dL Final  ?12/10/2021 04:03 PM 2.63 (H) 0.61 - 1.24 mg/dL Final  ?12/09/2021 02:16 AM 3.02 (H) 0.61 - 1.24 mg/dL Final  ?12/08/2021 03:02 AM 3.64 (H) 0.61 - 1.24 mg/dL Final  ?12/07/2021 02:45 PM 3.68 (H) 0.61 - 1.24 mg/dL Final  ?10/21/2021 08:48 AM 2.73 (H) 0.61 - 1.24 mg/dL Final  ?10/19/2021 02:44 AM 3.59 (H) 0.61 - 1.24 mg/dL Final  ?10/18/2021 06:07 AM 4.33 (H) 0.61 - 1.24 mg/dL Final  ?10/18/2021 12:37 AM 4.43 (H) 0.61 - 1.24 mg/dL Final  ?  10/14/2021 03:54 AM 2.88 (H) 0.61 - 1.24 mg/dL Final  ?10/13/2021 04:17 AM 2.75 (H) 0.61 - 1.24 mg/dL Final  ?10/12/2021 04:01 AM 3.05 (H) 0.61 - 1.24 mg/dL Final  ?10/11/2021 04:34 AM 3.44 (H) 0.61 - 1.24 mg/dL Final  ?10/10/2021 03:59 AM 3.28 (H) 0.61 - 1.24 mg/dL Final  ?10/09/2021 04:33 AM 3.82 (H) 0.61 - 1.24 mg/dL Final  ?10/08/2021 07:45 AM 4.16 (H) 0.61 - 1.24 mg/dL Final  ?10/08/2021 04:20 AM 4.55 (H) 0.61 - 1.24 mg/dL Final  ?10/07/2021 11:42 PM 4.57 (H) 0.61 - 1.24 mg/dL Final  ?10/07/2021 12:50 PM 4.64 (H) 0.61 - 1.24 mg/dL Final  ?09/30/2021 05:16 AM 2.87 (H) 0.61 - 1.24 mg/dL Final  ?09/29/2021 06:01 AM 2.76 (H) 0.61 - 1.24 mg/dL Final  ?09/28/2021 05:51 AM 3.03 (H) 0.61 - 1.24 mg/dL Final  ?09/27/2021 06:11 AM 3.07 (H) 0.61 - 1.24 mg/dL Final  ?09/26/2021 05:30 AM 3.08 (H) 0.61 - 1.24 mg/dL Final  ?09/25/2021 03:21 AM 2.75 (H) 0.61 - 1.24 mg/dL Final   ?09/23/2021 09:52 AM 2.54 (H) 0.61 - 1.24 mg/dL Final  ?09/22/2021 09:57 AM 2.43 (H) 0.61 - 1.24 mg/dL Final  ?09/21/2021 07:00 AM 2.22 (H) 0.61 - 1.24 mg/dL Final  ?09/20/2021 06:13 AM 2.33 (H) 0.61 - 1.24 mg/dL Final  ?09/20/2021 06:13 AM 2.35 (H) 0.61 - 1.24 mg/dL Final  ?09/19/2021 05:06 AM 2.86 (H) 0.61 - 1.24 mg/dL Final  ?09/18/2021 09:34 AM 3.51 (H) 0.61 - 1.24 mg/dL Final  ?09/16/2021 10:45 PM 4.62 (H) 0.61 - 1.24 mg/dL Final  ?06/05/2021 04:34 AM 2.41 (H) 0.61 - 1.24 mg/dL Final  ? ? ? ?PMHx: ?  ?Past Medical History:  ?Diagnosis Date  ? Arthritis   ? BPH (benign prostatic hyperplasia) 11/24/2019  ? Chronic pain syndrome 11/24/2019  ? CKD (chronic kidney disease), stage IV ([place]) 11/24/2019  ? Diabetes mellitus without complication ([place])   ? Gout   ? Hypertension   ? Muscle pain   ? Physical deconditioning 11/24/2019  ? Scars   ? Swelling   ? ? ?Past Surgical History:  ?Procedure Laterality Date  ? BELOW KNEE LEG AMPUTATION Right   ? ESOPHAGOGASTRODUODENOSCOPY (EGD) WITH PROPOFOL N/A 11/25/2019  ? Procedure: ESOPHAGOGASTRODUODENOSCOPY (EGD) WITH PROPOFOL;  Surgeon: Carol Ada, MD;  Location: [place];  Service: Endoscopy;  Laterality: N/A;  ? IR FLUORO GUIDE CV LINE RIGHT  09/05/2019  ? IR LUMBAR DISC ASPIRATION W/IMG GUIDE  11/26/2019  ? IR US GUIDE VASC ACCESS RIGHT  09/05/2019  ? RADIOLOGY WITH ANESTHESIA N/A 11/25/2019  ? Procedure: Thoracic and Lumbar MRI WITH ANESTHESIA;  Surgeon: Radiologist, Medication, MD;  Location: [place];  Service: Radiology;  Laterality: N/A;  ? ? ?Family Hx:  ?Family History  ?Problem Relation Age of Onset  ? Heart disease Mother   ? Cancer Father   ? ? ?Social History:  reports that he has never smoked. He has never been exposed to tobacco smoke. He has never used smokeless tobacco. He reports that he does not drink alcohol and does not use drugs. ? ?Allergies:  ?Allergies  ?Allergen Reactions  ? Other Other (See Comments)  ?  Blood pressure issues ?Per [facility] hospital: Pt states  he can only take these pain meds or else he gets very sick: Oxycontin,morphine,demerol, and dilaudid are the only pain meds pt states he can take.  ?  ? Oxymorphone Other (See Comments)  ?  Causes kidney problems  ? Aspirin Nausea And Vomiting  ?  Per [facility], pt  does not know reaction   ? Beta Vulgaris Nausea And Vomiting  ? Buspirone Other (See Comments)  ?  Affected his head ?  ? Cabbage Nausea And Vomiting  ? Codeine Other (See Comments)  ?  Pt does not remember the reaction but knows he can not take ?  ? Diflunisal Nausea And Vomiting and Other (See Comments)  ? Fish Allergy Nausea And Vomiting  ? Fish-Derived Products Nausea And Vomiting  ? Hydroxyzine Nausea And Vomiting  ? Methadone Other (See Comments)  ?  Made him loopy  ? Pentazocine Other (See Comments)  ?  Pt does not remember the reaction   ? Propoxyphene Other (See Comments)  ?  Pt does not remember the reaction ? ?  ? Shellfish Allergy Nausea And Vomiting  ? Sulfa Antibiotics Hives  ? Sulfasalazine Other (See Comments)  ?  Pt does not remember the reaction  ? Amoxicillin Nausea And Vomiting and Rash  ?  Per [facility]  ? ? ?Medications: ?Prior to Admission medications   ?Medication Sig Start Date End Date Taking? Authorizing Provider  ?acetaminophen (TYLENOL) 650 MG CR tablet Take 1,300 mg by mouth every 8 (eight) hours as needed for pain.   Yes [provider]  ?allopurinol (ZYLOPRIM) 100 MG tablet Take 100 mg by mouth 2 (two) times daily.   Yes [provider]  ?amLODipine (NORVASC) 5 MG tablet Take 5 mg by mouth daily.   Yes [provider]  ?atorvastatin (LIPITOR) 80 MG tablet Take 80 mg by mouth daily.   Yes [provider]  ?bisacodyl (DULCOLAX) 10 MG suppository Place 10 mg rectally daily as needed for mild constipation.   Yes [provider]  ?cloNIDine (CATAPRES) 0.1 MG tablet Take 0.1 mg by mouth 3 (three) times daily.   Yes [provider]  ?colchicine 0.6 MG tablet  Take 0.6 mg by mouth daily as needed.   Yes [provider]  ?ferrous sulfate 325 (65 FE) MG tablet Take 1 tablet (325 mg total) by mouth daily with breakfast. 12/15/21 01/14/22 Yes Antonieta Pert, MD  ?f

## 2022-01-16 DIAGNOSIS — N184 Chronic kidney disease, stage 4 (severe): Secondary | ICD-10-CM | POA: Diagnosis not present

## 2022-01-16 DIAGNOSIS — N179 Acute kidney failure, unspecified: Secondary | ICD-10-CM | POA: Diagnosis not present

## 2022-01-16 LAB — BASIC METABOLIC PANEL
Anion gap: 9 (ref 5–15)
BUN: 39 mg/dL — ABNORMAL HIGH (ref 8–23)
CO2: 25 mmol/L (ref 22–32)
Calcium: 7.4 mg/dL — ABNORMAL LOW (ref 8.9–10.3)
Chloride: 107 mmol/L (ref 98–111)
Creatinine, Ser: 3.14 mg/dL — ABNORMAL HIGH (ref 0.61–1.24)
GFR, Estimated: 21 mL/min — ABNORMAL LOW (ref >60)
Glucose, Bld: 112 mg/dL — ABNORMAL HIGH (ref 70–99)
Potassium: 4.9 mmol/L (ref 3.5–5.1)
Sodium: 141 mmol/L (ref 135–145)

## 2022-01-16 LAB — GLUCOSE, CAPILLARY
Glucose-Capillary: 95 mg/dL (ref 70–99)
Glucose-Capillary: 128 mg/dL — ABNORMAL HIGH (ref 70–99)
Glucose-Capillary: 161 mg/dL — ABNORMAL HIGH (ref 70–99)
Glucose-Capillary: 92 mg/dL (ref 70–99)

## 2022-01-16 LAB — PROTIME-INR
INR: 1.3 — ABNORMAL HIGH (ref 0.8–1.2)
Prothrombin Time: 16.2 seconds — ABNORMAL HIGH (ref 11.4–15.2)

## 2022-01-16 MED ORDER — WARFARIN SODIUM 5 MG PO TABS
5.0000 mg | ORAL_TABLET | Freq: Once | ORAL | Status: AC
  Administered 2022-01-16: 5 mg via ORAL
  Filled 2022-01-16: qty 1

## 2022-01-16 MED ORDER — SODIUM CHLORIDE 0.9 % IV SOLN
125.0000 mg | Freq: Every day | INTRAVENOUS | Status: DC
  Administered 2022-01-16 – 2022-01-18 (×3): 125 mg via INTRAVENOUS
  Filled 2022-01-16 (×3): qty 10

## 2022-01-16 NOTE — Progress Notes (Signed)
ANTICOAGULATION CONSULT NOTE - Follow Up Consult ? ?Pharmacy Consult for Warfarin ?Indication: hx DVT ? ?Patient Measurements: ?Height: 6\' 5"  (195.6 cm) ?Weight: 124.3 kg (274 lb 0.5 oz) ?IBW/kg (Calculated) : 89.1 ? ?Vital Signs: ?Temp: 98.2 ?F (36.8 ?C) (03/30 0935) ?Temp Source: Oral (03/30 0935) ?BP: 150/79 (03/30 0935) ?Pulse Rate: 58 (03/30 0935) ? ?Labs: ?Recent Labs  ?  01/13/22 ?1958 01/13/22 ?1958 01/13/22 ?2226 01/14/22 ?0412 01/14/22 ?1022 01/15/22 ?0410 01/16/22 ?0444  ?HGB 8.1*  --   --  7.4*  --  8.2*  --   ?HCT 26.6*  --   --  24.3*  --  26.2*  --   ?PLT 205  --   --  168  --  178  --   ?APTT  --   --   --  64*  --   --   --   ?LABPROT  --    < > 71.4*  --  28.9* 18.5* 16.2*  ?INR  --    < > 8.7*  --  2.7* 1.5* 1.3*  ?CREATININE 3.88*  --   --  3.66*  --  3.26* 3.14*  ?TROPONINIHS 10  --  9  --   --   --   --   ? < > = values in this interval not displayed.  ? ? ? ?Estimated Creatinine Clearance: 32.9 mL/min (A) (by C-G formula based on SCr of 3.14 mg/dL (H)). ? ?Assessment: ?71 y.o. male admitted 01/13/22 with acute on chronic kidney failure, h/o DVT and supratherapeutic INR. Pharmacy consulted to dose warfarin.  ? ? 3/27: INR 8.7 on admit 3/27 and Vitamin K 1 mg IV given.  ? 3/28: INR 2.7 > warfarin 2.5 mg x 1 given ? 3/29: INR 1.5 > warfarin 4 mg x 1 given ? 3/30: INR 1.3 > warfarin 5 mg x 1 ordered ? ? Prior to admission, patient was intended to be on warfarin 2.5 mg daily after discharge 01/03/22, but was taking warfarin 7.5 mg daily.  ? ?Goal of Therapy:  ?INR 2-3 ?Monitor platelets by anticoagulation protocol: Yes ?  ?Plan:  ?Warfarin 5 mg x 1 today. ?Daily PT/INR. ? ?Arty Baumgartner, RPh ?01/16/2022,12:30 PM ? ? ?

## 2022-01-16 NOTE — Progress Notes (Signed)
Physical Therapy Treatment ?Patient Details ?Name: Thomas [name] ?MRN: [id number] ?DOB: [birth year] ?Today's Date: 01/16/2022 ? ? ?History of Present Illness Patient is a 71 year old male who presented to the hospital via EMS with generalized weakness and SOB. Pt admitted for acute renal failure superimposed on stage 4 chronic kidney disease. Two recent hospitalizations with most recent d/c on 01/03/22. PMH: chronic pain, back surgeries, DVT, PE, DM, CKD, R BKA, HR, gout, hyperhalemia, AKI, chronic hydronephrosis. ? ?  ?PT Comments  ? ? Patient making progress towards physical therapy goals. He was able to don prosthesis independently. Patient ambulated 50' x 2 with RW and min guard. Patient performed sit to stand x 5 with cues for controlled descent and hand placement. D/c plan remains appropriate.   ?  ?Recommendations for follow up therapy are one component of a multi-disciplinary discharge planning process, led by the attending physician.  Recommendations may be updated based on patient status, additional functional criteria and insurance authorization. ? ?Follow Up Recommendations ? Home health PT ?  ?  ?Assistance Recommended at Discharge Intermittent Supervision/Assistance  ?Patient can return home with the following A little help with walking and/or transfers;A little help with bathing/dressing/bathroom;Assist for transportation;Help with stairs or ramp for entrance ?  ?Equipment Recommendations ? None recommended by PT  ?  ?Recommendations for Other Services   ? ? ?  ?Precautions / Restrictions Precautions ?Precautions: Fall ?Precaution Comments: R BKA - prosthetic in room ?Restrictions ?Weight Bearing Restrictions: No ?Other Position/Activity Restrictions: dressing in place L distal LE. Pt reports donning gripper sock over dressing for mobility  ?  ? ?Mobility ? Bed Mobility ?Overal bed mobility: Modified Independent ?  ?  ?  ?  ?  ?  ?  ?  ? ?Transfers ?Overall transfer level: Needs assistance ?Equipment  used: Rolling Jaiceon Collister (2 wheels) ?Transfers: Sit to/from Stand ?Sit to Stand: Min assist, From elevated surface ?  ?  ?  ?  ?  ?General transfer comment: minA to rise and steady from very elevated surface ?  ? ?Ambulation/Gait ?Ambulation/Gait assistance: Min guard ?Gait Distance (Feet): 50 Feet (+50') ?Assistive device: Rolling Melvinia Ashby (2 wheels) ?Gait Pattern/deviations: Step-through pattern, Decreased stride length, Trunk flexed ?Gait velocity: decreased ?  ?  ?General Gait Details: min guard for safety. Cues required for close RW proximity and upright posture but poor follow through ? ? ?Stairs ?  ?  ?  ?  ?  ? ? ?Wheelchair Mobility ?  ? ?Modified Rankin (Stroke Patients Only) ?  ? ? ?  ?Balance Overall balance assessment: Needs assistance ?Sitting-balance support: Feet supported, No upper extremity supported ?Sitting balance-[name]: Good ?  ?  ?Standing balance support: During functional activity, Reliant on assistive device for balance, Bilateral upper extremity supported ?Standing balance-[name]: Poor ?  ?  ?  ?  ?  ?  ?  ?  ?  ?  ?  ?  ?  ? ?  ?Cognition Arousal/Alertness: Awake/alert ?Behavior During Therapy: [facility] for tasks assessed/performed ?Overall Cognitive Status: Impaired/Different from baseline ?Area of Impairment: Safety/judgement, Problem solving ?  ?  ?  ?  ?  ?  ?  ?  ?  ?  ?  ?  ?Safety/Judgement: Decreased awareness of safety, Decreased awareness of deficits ?  ?Problem Solving: Requires verbal cues ?General Comments: Poor insight into medical status. ?  ?  ? ?  ?Exercises Other Exercises ?Other Exercises: sit to stand x 5 with cues for hand placement ? ?  ?  General Comments   ?  ?  ? ?Pertinent Vitals/Pain Pain Assessment ?Pain Assessment: Faces ?Faces Pain Scale: Hurts little more ?Pain Location: back (chronic) ?Pain Descriptors / Indicators: Discomfort ?Pain Intervention(s): Monitored during session  ? ? ?Home Living   ?  ?  ?  ?  ?  ?  ?  ?  ?  ?   ?  ?Prior Function    ?  ?  ?    ? ?PT Goals (current goals can now be found in the care plan section) Acute Rehab PT Goals ?Patient Stated Goal: home ?PT Goal Formulation: With patient ?Time For Goal Achievement: 01/29/22 ?Potential to Achieve Goals: Fair ?Progress towards PT goals: Progressing toward goals ? ?  ?Frequency ? ? ? Min 3X/week ? ? ? ?  ?PT Plan Current plan remains appropriate  ? ? ?Co-evaluation   ?  ?  ?  ?  ? ?  ?AM-PAC PT "6 Clicks" Mobility   ?Outcome Measure ? Help needed turning from your back to your side while in a flat bed without using bedrails?: None ?Help needed moving from lying on your back to sitting on the side of a flat bed without using bedrails?: None ?Help needed moving to and from a bed to a chair (including a wheelchair)?: A Little ?Help needed standing up from a chair using your arms (e.g., wheelchair or bedside chair)?: A Little ?Help needed to walk in hospital room?: A Little ?Help needed climbing 3-5 steps with a railing? : A Lot ?6 Click Score: 19 ? ?  ?End of Session Equipment Utilized During Treatment: Gait belt ?Activity Tolerance: Patient tolerated treatment well ?Patient left: in bed;with call bell/phone within reach;with bed alarm set ?Nurse Communication: Mobility status ?PT Visit Diagnosis: Muscle weakness (generalized) (M62.81);Difficulty in walking, not elsewhere classified (R26.2) ?  ? ? ?Time: 1224-8250 ?PT Time Calculation (min) (ACUTE ONLY): 18 min ? ?Charges:  $Gait Training: 8-22 mins          ?          ? ?Merl Bommarito A. Gilford Rile, PT, DPT ?Acute Rehabilitation Services ?Pager [phone] ?Office [phone] ? ? ? ?Loni Delbridge A Dwaine Pringle ?01/16/2022, 3:32 PM ? ?

## 2022-01-16 NOTE — Progress Notes (Signed)
Subjective:  hemodynamically stable-  4400 of UOP on no diuretic -  crt down a little -  said he is better-  but still with utterences not associated with conversation  ?Objective ?Vital signs in last 24 hours: ?Vitals:  ? 01/15/22 1743 01/15/22 2107 01/16/22 0629 01/16/22 0935  ?BP: (!) 169/95 (!) 169/95 (!) 159/91 (!) 150/79  ?Pulse: 67 69 (!) 58 (!) 58  ?Resp: 18 18 16 17   ?Temp: 98.8 ?F (37.1 ?C) 98.1 ?F (36.7 ?C) 98.3 ?F (36.8 ?C) 98.2 ?F (36.8 ?C)  ?TempSrc: Oral Oral Oral Oral  ?SpO2: 93% 94% 96% 95%  ?Weight:      ?Height:      ? ?Weight change:  ? ?Intake/Output Summary (Last 24 hours) at 01/16/2022 1058 ?Last data filed at 01/16/2022 1000 ?Gross per 24 hour  ?Intake 377 ml  ?Output 3000 ml  ?Net -2623 ml  ? ? 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  Has had several episodes of A on CRF -  at least once got better with foley-  and now appears to be having the same experience. He is not dry- no nephrotoxins identified.  Since crt is coming down I really dont have any further suggestions.  The one thing I did was take his neurontin dose down.  He is followed by Dr. Thompson Caul at [facility] as OP ?2. Hypertension/volume  - he is not dry-  does not need fluids-  in fact is edematous.  His BP is high -  on norvasc 5, clonidine 0.1, lopressor and flomax.  Since UOP is good does not need diuretics  ?3. Elytes-  has a tendency toward hyperkalemia-  on bicarb and was previously sent out on lokelma-  no need for lokelma right now ?4. Anemia  -  due to CKD and multiple hosps-  iron stores  low, will replete and add ESA ?5. GU-  BOO and hydro found on imaging s/p foley with improvement in renal function-  has happened once before and also improved  with foley drainage.  sounds like will need to continue foley as discharge -  he says that he doesn't want but it is clear he needs  ? ? ?Thomas [name]  ? ? ?Labs: ?Basic Metabolic Panel: ?Recent Labs  ?Lab 01/14/22 ?0412 01/15/22 ?0410 01/16/22 ?5009  ?NA 140 140 141  ?K 4.9 4.9 4.9  ?CL 107 107 107  ?CO2 22 23 25   ?GLUCOSE 75 109* 112*  ?BUN 59* 47* 39*  ?CREATININE 3.66* 3.26* 3.14*  ?CALCIUM 6.5* 7.5* 7.4*  ? ?Liver Function Tests: ?Recent Labs  ?Lab 01/13/22 ?1958 01/14/22 ?0412  ?AST 26 19  ?ALT 10 9  ?ALKPHOS 75 67  ?BILITOT 0.7 0.6  ?PROT 7.8 7.0  ?ALBUMIN 3.2* 2.8*  ? ?No results for input(s): LIPASE, AMYLASE in the last 168 hours. ?No results for input(s): AMMONIA in the last 168 hours. ?CBC: ?Recent Labs  ?Lab 01/13/22 ?1958 01/14/22 ?0412 01/15/22 ?0410  ?WBC 5.3 4.3 5.5  ?NEUTROABS 3.4 2.5  --   ?HGB 8.1* 7.4* 8.2*  ?HCT 26.6* 24.3* 26.2*  ?MCV 103.9* 101.7* 100.8*  ?PLT 205 168 178  ? ?Cardiac Enzymes: ?No results for  input(s): CKTOTAL, CKMB, CKMBINDEX, TROPONINI in the last 168 hours. ?CBG: ?Recent Labs  ?Lab 01/15/22 ?0722 01/15/22 ?1152 01/15/22 ?1638 01/15/22 ?2109 01/16/22 ?0719  ?GLUCAP 96 133* 115* 110* 95  ? ? ?Iron Studies:  ?Recent Labs  ?  01/15/22 ?1724  ?IRON 31*  ?TIBC 224*  ?FERRITIN 40  ? ?Studies/Results: ?No results found. ?Medications: ?Infusions: ? ? ?Scheduled Medications: ? amLODipine  5 mg Oral Daily  ? atorvastatin  80 mg Oral Daily  ? Chlorhexidine Gluconate Cloth  6 each Topical Daily  ? cloNIDine  0.1 mg Oral Q8H  ? darbepoetin (ARANESP) injection - NON-DIALYSIS  200 mcg Subcutaneous Q Wed-1800  ? ferrous sulfate  325 mg Oral Q breakfast  ? finasteride  5 mg Oral Daily  ? gabapentin  100 mg Oral TID  ? insulin aspart  0-9 Units Subcutaneous TID AC & HS  ? levothyroxine  50 mcg Oral Q0600  ? metoprolol tartrate  50 mg Oral BID  ? naloxegol oxalate  25 mg Oral Daily  ? oxyCODONE  30 mg Oral Q8H  ? pantoprazole  40 mg Oral BID  ? polyethylene glycol  17 g Oral Daily  ?  sodium bicarbonate  650 mg Oral Daily  ? tamsulosin  0.4 mg Oral Daily  ? Warfarin - Pharmacist Dosing Inpatient   Does not apply [id number]  ? ? have reviewed scheduled and prn medications. ? ?Physical Exam: ?General:  sleeping-  did awake-   occasional bizarre comments ?Heart: RRR ?Lungs: mostly clear ?Abdomen: soft, non tender ?Extremities: pitting edema  ? ? ? ?01/16/2022,10:58 AM ? LOS: 3 days  ? ?  ? ? ? ? ?

## 2022-01-16 NOTE — Progress Notes (Addendum)
?Progress note ? ?HPI:  ? 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 ? ?Of note, patient was recently hospitalized at [facility] long hospital from 3/13 until 3/17.  Patient was felt to be suffering from left lower extremity cellulitis and multifocal pneumonia.  Patient was treated with intravenous antibiotics which was later transitioned to oral Omnicef therapy at time of discharge.   Patient also found to have acute kidney injury superimposed on his chronic kidney disease with creatinine peaking at 4.47 on arrival and downtrending to 2.3 at time of discharge.  Patient was discharged on 3/17. ? ?Patient explains that for the past few days since his recent discharge he has developed progressively worsening generalized weakness with increasing shortness of breath.  Patient is also complaining of associated left lower extremity edema.  Patient denies any fevers, sick contacts, recent travel or contact with confirmed COVID-19 infection. ? ?As patient's symptoms continue to worsen he has been also experiencing episodic chest discomfort.  Earlier in the day on 3/27, patient's home health nurse came to visit him to dress the wounds on his left lower extremity when he suddenly began complaining of chest pain while sitting in his recliner.  Nurse contacted EMS who promptly came to evaluate the patient and brought him into [facility] emergency department for evaluation.  Patient declined  aspirin in route. ? ?Upon evaluation in the emergency department patient was found once again found to have acute kidney injury superimposed on chronic kidney disease with creatinine of 3.88.  Patient was also noted to have mild hyperkalemia 5.7.  EKG revealed no dynamic ST segment change and troponin was unremarkable at 10.  Hospitalist group was called to assess the patient for admission to the hospital. ? ? ? ?Physical Exam: ? ?Vitals:  ? 01/15/22 1743 01/15/22 2107 01/16/22 6761 01/16/22 0935  ?BP: (!) 169/95 (!) 169/95 (!) 159/91 (!) 150/79  ?Pulse: 67 69 (!) 58 (!) 58  ?Resp: 18 18 16 17   ?Temp: 98.8 ?F (37.1 ?C) 98.1 ?F (36.7 ?C) 98.3 ?F (36.8 ?C) 98.2 ?F (36.8 ?C)  ?TempSrc: Oral Oral Oral Oral  ?SpO2: 93% 94% 96% 95%  ?Weight:      ?Height:      ? ? ?Constitutional: Awake alert and oriented x3,  ?Skin: Unstageable wound on left heel, present on admission.  Otherwise, no rashes, no lesions, good skin turgor noted. ?Eyes: Pupils are equally reactive to light.  No evidence of scleral icterus or conjunctival pallor.  ?ENMT: Moist mucous membranes noted.  Posterior pharynx clear of any exudate or lesions.   ?Neck: normal, supple, no masses, no thyromegaly.  No evidence of jugular venous distension.   ?Respiratory: clear to auscultation bilaterally, no wheezing, no crackles. Normal respiratory effort. No accessory muscle use.  ?Cardiovascular: Regular rate and rhythm, no murmurs / rubs / gallops. No extremity edema. 2+ pedal pulses. No carotid bruits.  ?Chest:   Generalized anterior chest wall tenderness without crepitus or deformity.   ?Back:   Notable tenderness along the cervical spine without crepitus or deformity. ?Abdomen: Abdomen is  soft and nontender.  No evidence of intra-abdominal masses.  Positive bowel sounds noted in all quadrants.   ?Musculoskeletal: Right BKA.  No joint deformity upper and lower extremities. Good ROM, no contractures. Normal muscle tone.  ?Neurologic: Intermittent jerking of the  extremities throughout the entire interview and exam.  CN 2-12 grossly intact. Sensation intact.  Patient moving all 4 extremities spontaneously.  Patient is following all commands.  Patient is responsive to verbal stimuli.   ?Psychiatric: Patient exhibits anxious mood with appropriate affect.  Patient seems to possess insight as to their current situation.   ? ?Data Reviewed: ? ?I have personally reviewed and interpreted labs, imaging. ? ?Significant findings are BNP 292, troponin 10, creatinine 3.88, BUN 62, potassium 5.4 hemoglobin 8.1, white blood cell count 5.3, COVID-19 PCR testing pending. ? ?Chest x-ray personally reviewed revealing extremely patchy bilateral infiltrates ? ?EKG: Personally reviewed.  Rhythm is normal sinus rhythm with heart rate of 60 bpm.  QTc of 490 ms. no dynamic ST segment changes appreciated. ? ? ?Assessment and Plan: ?* Acute renal failure superimposed on stage 4 chronic kidney disease ([place]) ?Patient presenting with recurrent acute kidney injury of unclear etiology ?Creatinine currently 3.14, up from baseline of 2.3 at time of recent discharge from the hospital, slowly improving ?IV fluids stopped  ?strict input and output monitoring ?Avoiding nephrotoxic agents ?Monitoring renal function and electrolytes with serial chemistries ?Imaging consistent with bladder outlet obstruction, urology consult obtained ?Appreciate nephrology input ? ?Chest pain ?Patient complained of atypical chest discomfort earlier in the day on 3/27 while at rest ?Likely etiology is musculoskeletal  ?Patient is currently chest pain-free ?2 sets of cardiac enzymes unremarkable ?EKG shows no dynamic ST segment change ?Continue to monitor patient on telemetry ? ? ?Gabapentin-induced toxicity ?Patient complaining of a 2-day history of intermittent jerking of the extremities ?After examining the patient I believe this is likely secondary to gabapentin toxicity due to decreased gabapentin clearance from renal  injury ?Temporarily holding gabapentin ?Gabapentin can be resumed once renal injury resolves at decreased dose ?Jerking resolved ? ?Hyperkalemia ?Patient exhibiting mild hyperkalemia, likely secondary to diminished renal function ?No evidence of impact of hyperkalemia on EKG ?Providing patient with a dose of Lokelma ?Monitoring renal function and electrolytes with serial chemistries ?Monitoring patient on telemetry ? ? ?Deep vein thrombosis (DVT) of left lower extremity (H[facility]) ?Coumadin currently supratherapeutic ?Giving 1 mg of intravenous vitamin K per pharmacy recommendation. ?We will resume once able to maintain INR between 2 and 3 ? ? ?Type 2 diabetes mellitus with stage 4 chronic kidney disease, without long-term current use of insulin ([place]) ?Patient been placed on Accu-Cheks before every meal and nightly with sliding scale insulin ?Holding home regimen of hypoglycemics ?Hemoglobin A1c 4.8% earlier in the month ?Diabetic Diet ? ?Chronic combined systolic and diastolic congestive heart failure ([place]) ?No obvious clinical evidence of cardiogenic volume overload on exam ?Vague patchy bilateral infiltrates on chest x-ray however unlikely to be due to cardiogenic volume overload.   ?Will evaluate further with BNP, CRP, procalcitonin and CT chest ? ?Essential hypertension ?Resume patients home regimen of oral antihypertensives ?Titrate antihypertensive regimen as necessary to achieve adequate BP control ?PRN intravenous antihypertensives for excessively elevated blood pressure ? ?Open wound of left heel ?Notable wound on left heel, present on admission ?We will place wound care consult for assistance in managing and dressing this wound ? ?Hypothyroidism ?Resume home regimen of Synthroid ? ?BPH (benign prostatic hyperplasia) ?Continue home regimen of Flomax and finasteride ? ?Obtain  physical therapy evaluation to see if he could benefit from short-term rehab.  Disposition plan pending physical therapy and nephrology  recommendations ? ? ?Edessa Jakubowicz A MD ? ?01/16/2022 9:48 AM ?

## 2022-01-17 DIAGNOSIS — N179 Acute kidney failure, unspecified: Secondary | ICD-10-CM | POA: Diagnosis not present

## 2022-01-17 DIAGNOSIS — N184 Chronic kidney disease, stage 4 (severe): Secondary | ICD-10-CM | POA: Diagnosis not present

## 2022-01-17 LAB — RENAL FUNCTION PANEL
Albumin: 2.8 g/dL — ABNORMAL LOW (ref 3.5–5.0)
Anion gap: 9 (ref 5–15)
BUN: 33 mg/dL — ABNORMAL HIGH (ref 8–23)
CO2: 25 mmol/L (ref 22–32)
Calcium: 7.5 mg/dL — ABNORMAL LOW (ref 8.9–10.3)
Chloride: 106 mmol/L (ref 98–111)
Creatinine, Ser: 3.01 mg/dL — ABNORMAL HIGH (ref 0.61–1.24)
GFR, Estimated: 22 mL/min — ABNORMAL LOW (ref >60)
Glucose, Bld: 116 mg/dL — ABNORMAL HIGH (ref 70–99)
Phosphorus: 4.9 mg/dL — ABNORMAL HIGH (ref 2.5–4.6)
Potassium: 4.5 mmol/L (ref 3.5–5.1)
Sodium: 140 mmol/L (ref 135–145)

## 2022-01-17 LAB — PROTIME-INR
INR: 1.5 — ABNORMAL HIGH (ref 0.8–1.2)
Prothrombin Time: 17.7 seconds — ABNORMAL HIGH (ref 11.4–15.2)

## 2022-01-17 LAB — GLUCOSE, CAPILLARY
Glucose-Capillary: 105 mg/dL — ABNORMAL HIGH (ref 70–99)
Glucose-Capillary: 122 mg/dL — ABNORMAL HIGH (ref 70–99)
Glucose-Capillary: 146 mg/dL — ABNORMAL HIGH (ref 70–99)
Glucose-Capillary: 139 mg/dL — ABNORMAL HIGH (ref 70–99)

## 2022-01-17 MED ORDER — AMLODIPINE BESYLATE 10 MG PO TABS
10.0000 mg | ORAL_TABLET | Freq: Every day | ORAL | Status: DC
  Administered 2022-01-18: 10 mg via ORAL
  Filled 2022-01-17: qty 1

## 2022-01-17 MED ORDER — WARFARIN SODIUM 5 MG PO TABS
5.0000 mg | ORAL_TABLET | Freq: Once | ORAL | Status: AC
Start: 2022-01-17 — End: 2022-01-17
  Administered 2022-01-17: 5 mg via ORAL
  Filled 2022-01-17: qty 1

## 2022-01-17 NOTE — TOC Progression Note (Signed)
Transition of Care (TOC) - Progression Note  ? ? ?Patient Details  ?Name: Thomas [name] ?MRN: [id number] ?Date of Birth: 19-Feb-1951 ? ?Transition of Care (TOC) CM/SW Contact  ?Levada Dy  Nanda Bittick-Martin ?Phone Number: ?01/17/2022, 4:18 PM ?Judith[employer] Graves Appeal ?Detailed Notice of Discharge letter created and saved: Yes (Submitted by Riley Lam) ?Detailed Notice of Discharge Document Given to [employer]: Yes (Given by Riley Lam) ?[employer] ROI Document Created: Yes (Created by Larey Dresser) ?Kepro appeal documents uploaded to [employer] stite: Yes (Uploaded by Larey Dresser)  ?

## 2022-01-17 NOTE — Care Management Important Message (Signed)
Important Message ? ?Patient Details  ?Name: Thomas [name] ?MRN: [id number] ?Date of Birth: 11-May-1951 ? ? ?Medicare Important Message Given:  Yes ? ? ? ? ?Vianny Schraeder ?01/17/2022, 8:38 AM ?

## 2022-01-17 NOTE — Progress Notes (Signed)
Physical Therapy Treatment ?Patient Details ?Name: Thomas [name] ?MRN: [id number] ?DOB: Sep 18, 1951 ?Today's Date: 01/17/2022 ? ? ?History of Present Illness Patient is a 71 year old male who presented to the hospital via EMS with generalized weakness and SOB. Pt admitted for acute renal failure superimposed on stage 4 chronic kidney disease. Two recent hospitalizations with most recent d/c on 01/03/22. PMH: chronic pain, back surgeries, DVT, PE, DM, CKD, R BKA, HR, gout, hyperhalemia, AKI, chronic hydronephrosis. ? ?  ?PT Comments  ? ? Patient progressing towards physical therapy goals. Patient able to progress ambulation distance x 2 bouts but required frequent standing rest breaks due to fatigue. Patient eager to build up stamina and strength but requires encouragement to initiate OOB. D/c plan remains appropriate.  ?  ?Recommendations for follow up therapy are one component of a multi-disciplinary discharge planning process, led by the attending physician.  Recommendations may be updated based on patient status, additional functional criteria and insurance authorization. ? ?Follow Up Recommendations ? Home health PT ?  ?  ?Assistance Recommended at Discharge Intermittent Supervision/Assistance  ?Patient can return home with the following A little help with walking and/or transfers;A little help with bathing/dressing/bathroom;Assist for transportation;Help with stairs or ramp for entrance ?  ?Equipment Recommendations ? None recommended by PT  ?  ?Recommendations for Other Services   ? ? ?  ?Precautions / Restrictions Precautions ?Precautions: Fall ?Precaution Comments: R BKA - prosthetic in room ?Restrictions ?Weight Bearing Restrictions: No  ?  ? ?Mobility ? Bed Mobility ?Overal bed mobility: Modified Independent ?  ?  ?  ?  ?  ?  ?  ?  ? ?Transfers ?Overall transfer level: Needs assistance ?Equipment used: Rolling Keryn Nessler (2 wheels) ?Transfers: Sit to/from Stand ?Sit to Stand: Min assist, From elevated surface ?   ?  ?  ?  ?  ?  ?  ? ?Ambulation/Gait ?Ambulation/Gait assistance: Min guard ?Gait Distance (Feet): 80 Feet (+120) ?Assistive device: Rolling Geneieve Duell (2 wheels) ?Gait Pattern/deviations: Step-through pattern, Decreased stride length, Trunk flexed ?Gait velocity: decreased ?  ?  ?General Gait Details: min guard for safety, cues for RW proximity. Frequent standing rest breaks on second bout ? ? ?Stairs ?  ?  ?  ?  ?  ? ? ?Wheelchair Mobility ?  ? ?Modified Rankin (Stroke Patients Only) ?  ? ? ?  ?Balance Overall balance assessment: Needs assistance ?Sitting-balance support: Feet supported, No upper extremity supported ?Sitting balance-[name]: Good ?Sitting balance - Comments: able to don prosthesis at EOB ?  ?Standing balance support: During functional activity, Reliant on assistive device for balance, Bilateral upper extremity supported ?Standing balance-[name]: Poor ?Standing balance comment: heavy reliance on RW ?  ?  ?  ?  ?  ?  ?  ?  ?  ?  ?  ?  ? ?  ?Cognition Arousal/Alertness: Awake/alert ?Behavior During Therapy: [facility] for tasks assessed/performed ?Overall Cognitive Status: Impaired/Different from baseline ?Area of Impairment: Safety/judgement, Problem solving ?  ?  ?  ?  ?  ?  ?  ?  ?  ?  ?  ?  ?Safety/Judgement: Decreased awareness of safety, Decreased awareness of deficits ?  ?Problem Solving: Requires verbal cues ?  ?  ?  ? ?  ?Exercises   ? ?  ?General Comments   ?  ?  ? ?Pertinent Vitals/Pain Pain Assessment ?Pain Assessment: No/denies pain  ? ? ?Home Living   ?  ?  ?  ?  ?  ?  ?  ?  ?  ?   ?  ?  Prior Function    ?  ?  ?   ? ?PT Goals (current goals can now be found in the care plan section) Acute Rehab PT Goals ?Patient Stated Goal: home ?PT Goal Formulation: With patient ?Time For Goal Achievement: 01/29/22 ?Potential to Achieve Goals: Fair ?Progress towards PT goals: Progressing toward goals ? ?  ?Frequency ? ? ? Min 3X/week ? ? ? ?  ?PT Plan Current plan remains appropriate  ? ? ?Co-evaluation    ?  ?  ?  ?  ? ?  ?AM-PAC PT "6 Clicks" Mobility   ?Outcome Measure ? Help needed turning from your back to your side while in a flat bed without using bedrails?: None ?Help needed moving from lying on your back to sitting on the side of a flat bed without using bedrails?: None ?Help needed moving to and from a bed to a chair (including a wheelchair)?: A Little ?Help needed standing up from a chair using your arms (e.g., wheelchair or bedside chair)?: A Little ?Help needed to walk in hospital room?: A Little ?Help needed climbing 3-5 steps with a railing? : A Lot ?6 Click Score: 19 ? ?  ?End of Session Equipment Utilized During Treatment: Gait belt ?Activity Tolerance: Patient tolerated treatment well ?Patient left: in bed;with call bell/phone within reach;with bed alarm set ?Nurse Communication: Mobility status ?PT Visit Diagnosis: Muscle weakness (generalized) (M62.81);Difficulty in walking, not elsewhere classified (R26.2) ?  ? ? ?Time: 0981-1914 ?PT Time Calculation (min) (ACUTE ONLY): 31 min ? ?Charges:  $Gait Training: 23-37 mins          ?          ? ?Veniamin Kincaid A. Gilford Rile, PT, DPT ?Acute Rehabilitation Services ?Pager [phone] ?Office [phone] ? ? ? ?Raushanah Osmundson A Corrina Steffensen ?01/17/2022, 4:45 PM ? ?

## 2022-01-17 NOTE — Discharge Summary (Addendum)
Physician Discharge Summary  ?Thomas [name] [name] DOB: 11-01-1950 DOA: 01/13/2022 ? ?PCP: Antonietta Jewel, MD ? ?Admit date: 01/13/2022 ?Discharge date: 01/17/2022 ? ?Time spent: 45 minutes ? ?Recommendations for Outpatient Follow-up:  ?Follow-up with primary care physician in 1 to 2 weeks ?Follow-up with his nephrologist at [facility] in 1 to 2 weeks ?Follow-up with urologist in 1 to 2 weeks ? ? ?Discharge Diagnoses:  ?Principal Problem: ?  Acute renal failure superimposed on stage 4 chronic kidney disease ([place]) ?Active Problems: ?  Chest pain ?  Hyperkalemia ?  Gabapentin-induced toxicity ?  Deep vein thrombosis (DVT) of left lower extremity (HCC) ?  Type 2 diabetes mellitus with stage 4 chronic kidney disease, without long-term current use of insulin ([place]) ?  Chronic combined systolic and diastolic congestive heart failure ([place]) ?  Essential hypertension ?  Hypothyroidism ?  Open wound of left heel ?  BPH (benign prostatic hyperplasia) ? ? ?Discharge Condition: Stable ? ? ?Filed Weights  ? 01/13/22 1910 01/14/22 1831  ?Weight: 117.5 kg 124.3 kg  ? ? 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 ?  ?Of note, patient was recently hospitalized at [facility] long hospital from 3/13 until 3/17.  Patient was felt to be suffering from left lower extremity cellulitis and multifocal pneumonia.  Patient was treated with intravenous antibiotics which was later  transitioned to oral Omnicef therapy at time of discharge.   Patient also found to have acute kidney injury superimposed on his chronic kidney disease with creatinine peaking at 4.47 on arrival and downtrending to 2.3 at time of discharge.  Patient was discharged on 3/17. ?  ?Patient explains that for the past few days since his recent discharge he has developed progressively worsening generalized weakness with increasing shortness of breath.  Patient is also complaining of associated left lower extremity edema.  Patient denies any fevers, sick contacts, recent travel or contact with confirmed COVID-19 infection. ?  ?As patient's symptoms continue to worsen he has been also experiencing episodic chest discomfort.  Earlier in the day on 3/27, patient's home health nurse came to visit him to dress the wounds on his left lower extremity when he suddenly began complaining of chest pain while sitting in his recliner.  Nurse contacted EMS who promptly came to evaluate the patient and brought him into [facility] emergency department for evaluation.  Patient declined aspirin in route. ?  ?Upon evaluation in the emergency department patient was found once again found to have acute kidney injury superimposed on chronic kidney disease with creatinine of 3.88.  Patient was also noted to have mild hyperkalemia 5.7.  EKG revealed no dynamic ST segment change and troponin was unremarkable at 10.  Hospitalist group was called to assess the patient for admission to the hospital. ?  ?  ? ?Hospital Course:  ?Patient admitted initially given some fluids with improvement in renal function.  By the time of discharge his creatinine was down to 3 slowly improving.  He has not had any IV fluids over the  next couple of days.  Nephrology was consulted recommended to keep Foley catheter in and follow-up with urology however patient does not wish to do this.  He sees a nephrologist at [facility].  Physical therapy saw patient  recommending home health and physical therapy.  Patient is being discharged with home health physical therapy.  We have instructed him to keep the Foley catheter in and follow-up with urologist in 1 to 2 weeks and his nephrologist at [facility] in the next week or so.  His renal function should continue to improve over time.  He has been making adequate urinary output.  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  Possibly an obstructive component.  Gabapentin dosing has been decreased. ? ?Discharge Exam: ?Vitals:  ? 01/17/22 0441 01/17/22 0858  ?BP: (!) 164/88 (!) 172/104  ?Pulse: (!) 55 62  ?Resp: 18 17  ?Temp: 97.9 ?F (36.6 ?C) 98 ?F (36.7 ?C)  ?SpO2: 97% 95%  ? ? ?General: Alert and oriented no apparent distress ?Cardiovascular: Regular rate and rhythm without murmurs rubs or gallops ?Respiratory: Clear to auscultation bilateral no wheezes rhonchi rales ? ?Discharge Instructions ? ? ?Discharge Instructions   ? ? Diet - low sodium heart healthy   Complete by: As directed ?  ? Discharge instructions   Complete by: As directed ?  ? Follow-up with your nephrologist of your choice in 2 weeks ?Follow-up with primary care physician in 1 week ?Follow-up with urologist in 1 to 2 weeks  ? Discharge wound care:   Complete by: As directed ?  ? Dressing changes twice a week  ? Increase activity slowly   Complete by: As directed ?  ? ?  ? ?Allergies as of 01/17/2022   ? ?   Reactions  ? Other Other (See Comments)  ? Blood pressure issues ?Per [facility] hospital: Pt states he can only take these pain meds or else he gets very sick: Oxycontin,morphine,demerol, and dilaudid are the only pain meds pt states he can take.   ? Oxymorphone Other (See Comments)  ? Causes kidney problems  ? Aspirin Nausea And Vomiting  ? Per [facility], pt does not know reaction   ? Beta Vulgaris Nausea And Vomiting  ? Buspirone Other (See Comments)  ? Affected his head  ? Cabbage Nausea And Vomiting  ? Codeine Other (See Comments)   ? Pt does not remember the reaction but knows he can not take  ? Diflunisal Nausea And Vomiting, Other (See Comments)  ? Fish Allergy Nausea And Vomiting  ? Fish-derived Products Nausea And Vomiting  ? Hydroxyzine Nausea And Vomiting  ? Methadone Other (See Comments)  ? Made him loopy  ? Pentazocine Other (See Comments)  ? Pt does not remember the reaction   ? Propoxyphene Other (See Comments)  ? Pt does not remember the reaction  ? Shellfish Allergy Nausea And Vomiting  ? Sulfa Antibiotics Hives  ? Sulfasalazine Other (See Comments)  ? Pt does not remember the reaction  ? Amoxicillin Nausea And Vomiting, Rash  ? Per [facility]  ? ?  ? ?  ?Medication List  ?  ? ?STOP taking these medications   ? ?furosemide 40 MG tablet ?Commonly known as: LASIX ?  ?metFORMIN 500 MG tablet ?Commonly known as: GLUCOPHAGE ?  ? ?  ? ?TAKE these medications   ? ?acetaminophen 650 MG CR tablet ?Commonly known as: TYLENOL ?Take 1,300 mg by mouth every 8 (eight) hours as needed for pain. ?  ?  allopurinol 100 MG tablet ?Commonly known as: ZYLOPRIM ?Take 100 mg by mouth 2 (two) times daily. ?  ?amLODipine 5 MG tablet ?Commonly known as: NORVASC ?Take 5 mg by mouth daily. ?  ?atorvastatin 80 MG tablet ?Commonly known as: LIPITOR ?Take 80 mg by mouth daily. ?  ?bisacodyl 10 MG suppository ?Commonly known as: DULCOLAX ?Place 10 mg rectally daily as needed for mild constipation. ?  ?cefdinir 300 MG capsule ?Commonly known as: OMNICEF ?Take 1 capsule (300 mg total) by mouth every 12 (twelve) hours. ?What changed: additional instructions ?  ?cloNIDine 0.1 MG tablet ?Commonly known as: CATAPRES ?Take 0.1 mg by mouth 3 (three) times daily. ?  ?colchicine 0.6 MG tablet ?Take 0.6 mg by mouth daily as needed. ?  ?ferrous sulfate 325 (65 FE) MG tablet ?Take 1 tablet (325 mg total) by mouth daily with breakfast. ?  ?finasteride 5 MG tablet ?Commonly known as: PROSCAR ?Take 1 tablet (5 mg total) by mouth daily. ?  ?gabapentin 100 MG  capsule ?Commonly known as: NEURONTIN ?Take 1 capsule (100 mg total) by mouth 3 (three) times daily. ?  ?Neurontin 300 MG capsule ?Generic drug: gabapentin ?Take 300 mg by mouth 3 (three) times daily. ?  ?glipiZIDE 10 MG tablet ?Co

## 2022-01-17 NOTE — Progress Notes (Signed)
ANTICOAGULATION CONSULT NOTE - Follow Up Consult ? ?Pharmacy Consult for Warfarin ?Indication: hx DVT ? ?Patient Measurements: ?Height: 6\' 5"  (195.6 cm) ?Weight: 124.3 kg (274 lb 0.5 oz) ?IBW/kg (Calculated) : 89.1 ? ?Vital Signs: ?Temp: 97.9 ?F (36.6 ?C) (03/31 0441) ?Temp Source: Oral (03/31 0441) ?BP: 164/88 (03/31 0441) ?Pulse Rate: 55 (03/31 0441) ? ?Labs: ?Recent Labs  ?  01/15/22 ?0410 01/16/22 ?0444 01/17/22 ?0335  ?HGB 8.2*  --   --   ?HCT 26.2*  --   --   ?PLT 178  --   --   ?LABPROT 18.5* 16.2* 17.7*  ?INR 1.5* 1.3* 1.5*  ?CREATININE 3.26* 3.14* 3.01*  ? ? ? ?Estimated Creatinine Clearance: 34.3 mL/min (A) (by C-G formula based on SCr of 3.01 mg/dL (H)). ? ?Assessment: ?71 y.o. male admitted 01/13/22 with acute on chronic kidney failure, h/o DVT and supratherapeutic INR. Pharmacy consulted to dose warfarin.  ? ? 3/27: INR 8.7 on admit 3/27 and Vitamin K 1 mg IV given.  ? 3/28: INR 2.7 > warfarin 2.5 mg x 1 given ? 3/29: INR 1.5 > warfarin 4 mg x 1 given ? 3/30: INR 1.3 > warfarin 5 mg x 1 ordered ? 3/31: INR 1.5 > warfarin 5 mg x 1 ordered ? ? Prior to admission, patient was intended to be on warfarin 2.5 mg daily after discharge 01/03/22, but was taking warfarin 7.5 mg daily.  ? ?Goal of Therapy:  ?INR 2-3 ?Monitor platelets by anticoagulation protocol: Yes ?  ?Plan:  ?Warfarin 5 mg x 1 today. ?Daily PT/INR. ? ?Ysenia Filice A. Levada Dy, PharmD, BCPS, FNKF ?Clinical Pharmacist ?[place] ?Please utilize Amion for appropriate phone number to reach the unit pharmacist ([place]) ? ?01/17/2022,7:55 AM ? ? ?

## 2022-01-17 NOTE — TOC Progression Note (Addendum)
Transition of Care (TOC) - Progression Note  ? ? ?Patient Details  ?Name: Thomas [name] ?MRN: [id number] ?Date of Birth: 10-20-1951 ? ?Transition of Care (TOC) CM/SW Contact  ?[name], Renea Ee, RN ?Phone Number: ?01/17/2022, 4:43 PM ? ?Clinical Narrative:    ? ?Patient scheduled for discharge today. CM notified by patient's nurse that patient wants to appeal discharge. CM spoke with patient at bedside and he voiced his concern about being discharged early, stating he needs more therapy. CM advised patient that home health PT has been arranged for him. Patient insisted on appealing.  ?CM notified Levada Dy at Care Management office about appeal.  ?Discharge notice and [place] letter explained and given to patient with understanding verbalized and patient signed letter electronically. A copy given to patient and another copy placed on patient's chart.  ?Reference ID: [id number] ?CM will continue to follow with needs.  ? ?Expected Discharge Plan: [place] ?Barriers to Discharge: Continued Medical Work up ? ?Expected Discharge Plan and Services ?Expected Discharge Plan: [place] ?  ?Discharge Planning Services: CM Consult ?Post Acute Care Choice: Home Health ?Living arrangements for the past 2 months: Apartment ?Expected Discharge Date: 01/17/22               ?  ?  ?  ?  ?  ?HH Arranged: RN, PT, OT ?[place] Agency: [facility] ?Date HH Agency Contacted: 01/15/22 ?Time [place]: [id number] ?Representative spoke with at [place]: Delsa Sale ? ? ?Social Determinants of Health (SDOH) Interventions ?  ? ?Readmission Risk Interventions ? ?  01/15/2022  ?  2:56 PM 01/02/2022  ?  2:03 PM 12/31/2021  ? 10:32 AM  ?Readmission Risk Prevention Plan  ?Transportation Screening Complete Complete Complete  ?Medication Review Press photographer) Complete Complete Complete  ?PCP or Specialist appointment within 3-5 days of discharge Complete Complete Complete  ?[place] or Home Care Consult Complete  Complete Complete  ?SW Recovery Care/Counseling Consult Complete Complete Complete  ?Palliative Care Screening Not Applicable Not Applicable Not Applicable  ?[place] Not Applicable Not Applicable Not Applicable  ? ? ?

## 2022-01-17 NOTE — Progress Notes (Signed)
Subjective:  hemodynamically stable, BP even a little high-  at least 1400 of UOP on no diuretic -  crt down again -  he is much better today-  has some vitamin supplement that he says helps his kidneys  ? ? ?Objective ?Vital signs in last 24 hours: ?Vitals:  ? 01/16/22 0935 01/16/22 2139 01/17/22 0441 01/17/22 0858  ?BP: (!) 150/79 (!) 146/93 (!) 164/88 (!) 172/104  ?Pulse: (!) 58 (!) 58 (!) 55 62  ?Resp: 17 16 18 17   ?Temp: 98.2 ?F (36.8 ?C) 97.6 ?F (36.4 ?C) 97.9 ?F (36.6 ?C) 98 ?F (36.7 ?C)  ?TempSrc: Oral Oral Oral   ?SpO2: 95% 98% 97% 95%  ?Weight:      ?Height:      ? ?Weight change:  ? ?Intake/Output Summary (Last 24 hours) at 01/17/2022 1213 ?Last data filed at 01/17/2022 0700 ?Gross per 24 hour  ?Intake 580 ml  ?Output 1450 ml  ?Net -870 ml  ? ? 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  Has had several episodes of A on CRF -  at least once got better with foley-  and now appears to be having the same experience. He is not dry- no nephrotoxins identified.  Since crt is coming down I really dont have any further suggestions.  The one thing I did was take his neurontin dose down-  he agrees to take it only once a day at night.  He is followed by Dr. Thompson Caul at [facility] as OP ?2. Hypertension/volume  - he is not dry-  does not need fluids-  in fact is edematous.  His BP is high -  on norvasc 5, clonidine 0.1, lopressor and flomax.  Since UOP is good does not need diuretics -  will increase amlodipine to 10 qday ?3. Elytes-  has a tendency toward hyperkalemia-  on bicarb and was previously sent out on lokelma-  no need for lokelma right now ?4. Anemia  -  due to CKD and multiple hosps-  iron stores  low, will replete and have added  ESA ?5. GU-  BOO and hydro found on imaging s/p foley with improvement in renal function-  has happened once before and also improved with foley drainage.  sounds like will need to continue foley at discharge with urology follow up-   he really refuses-  in that case I told him to get back to his nephrologist and urologist soon after discharge ? ?Renal function trending better-  my recs above that he does not appear to want to follow.  He knows to check in with his nephrologist and urologist at discharge for continued care-  I will sign off-  call with questions ? ? ?Louis Meckel  ? ? ?Labs: ?Basic Metabolic Panel: ?Recent Labs  ?Lab 01/15/22 ?0410 01/16/22 ?4193 01/17/22 ?0335  ?NA 140 141 140  ?K 4.9 4.9 4.5  ?CL 107 107 106  ?CO2 23 25 25   ?GLUCOSE 109* 112* 116*  ?BUN 47* 39* 33*  ?CREATININE 3.26* 3.14* 3.01*  ?CALCIUM 7.5* 7.4* 7.5*  ?PHOS  --   --  4.9*  ? ?Liver Function Tests: ?Recent Labs  ?Lab 01/13/22 ?1958 01/14/22 ?0412 01/17/22 ?  0335  ?AST 26 19  --   ?ALT 10 9  --   ?ALKPHOS 75 67  --   ?BILITOT 0.7 0.6  --   ?PROT 7.8 7.0  --   ?ALBUMIN 3.2* 2.8* 2.8*  ? ?No results for input(s): LIPASE, AMYLASE in the last 168 hours. ?No results for input(s): AMMONIA in the last 168 hours. ?CBC: ?Recent Labs  ?Lab 01/13/22 ?1958 01/14/22 ?0412 01/15/22 ?0410  ?WBC 5.3 4.3 5.5  ?NEUTROABS 3.4 2.5  --   ?HGB 8.1* 7.4* 8.2*  ?HCT 26.6* 24.3* 26.2*  ?MCV 103.9* 101.7* 100.8*  ?PLT 205 168 178  ? ?Cardiac Enzymes: ?No results for input(s): CKTOTAL, CKMB, CKMBINDEX, TROPONINI in the last 168 hours. ?CBG: ?Recent Labs  ?Lab 01/16/22 ?1128 01/16/22 ?1631 01/16/22 ?2107 01/17/22 ?6754 01/17/22 ?1147  ?GLUCAP 128* 161* 92 105* 122*  ? ? ?Iron Studies:  ?Recent Labs  ?  01/15/22 ?1724  ?IRON 31*  ?TIBC 224*  ?FERRITIN 40  ? ?Studies/Results: ?No results found. ?Medications: ?Infusions: ? ferric gluconate (FERRLECIT) IVPB 125 mg (01/17/22 4920)  ? ? ?Scheduled Medications: ? amLODipine  5 mg Oral Daily  ? atorvastatin   80 mg Oral Daily  ? Chlorhexidine Gluconate Cloth  6 each Topical Daily  ? cloNIDine  0.1 mg Oral Q8H  ? darbepoetin (ARANESP) injection - NON-DIALYSIS  200 mcg Subcutaneous Q Wed-1800  ? ferrous sulfate  325 mg Oral Q breakfast  ? finasteride  5 mg Oral Daily  ? gabapentin  100 mg Oral TID  ? insulin aspart  0-9 Units Subcutaneous TID AC & HS  ? levothyroxine  50 mcg Oral Q0600  ? metoprolol tartrate  50 mg Oral BID  ? naloxegol oxalate  25 mg Oral Daily  ? oxyCODONE  30 mg Oral Q8H  ? pantoprazole  40 mg Oral BID  ? polyethylene glycol  17 g Oral Daily  ? sodium bicarbonate  650 mg Oral Daily  ? tamsulosin  0.4 mg Oral Daily  ? warfarin  5 mg Oral ONCE-1600  ? Warfarin - Pharmacist Dosing Inpatient   Does not apply F0071  ? ? have reviewed scheduled and prn medications. ? ?Physical Exam: ?General:  much better-  alert, talkative ?Heart: RRR ?Lungs: mostly clear ?Abdomen: soft, non tender ?Extremities: pitting edema but better  ? ? ? ?01/17/2022,12:13 PM ? LOS: 4 days  ? ?  ? ? ? ? ?

## 2022-01-18 DIAGNOSIS — N179 Acute kidney failure, unspecified: Secondary | ICD-10-CM | POA: Diagnosis not present

## 2022-01-18 DIAGNOSIS — N184 Chronic kidney disease, stage 4 (severe): Secondary | ICD-10-CM | POA: Diagnosis not present

## 2022-01-18 LAB — GLUCOSE, CAPILLARY
Glucose-Capillary: 89 mg/dL (ref 70–99)
Glucose-Capillary: 106 mg/dL — ABNORMAL HIGH (ref 70–99)
Glucose-Capillary: 138 mg/dL — ABNORMAL HIGH (ref 70–99)

## 2022-01-18 LAB — RENAL FUNCTION PANEL
Albumin: 2.9 g/dL — ABNORMAL LOW (ref 3.5–5.0)
Anion gap: 7 (ref 5–15)
BUN: 30 mg/dL — ABNORMAL HIGH (ref 8–23)
CO2: 23 mmol/L (ref 22–32)
Calcium: 7.4 mg/dL — ABNORMAL LOW (ref 8.9–10.3)
Chloride: 110 mmol/L (ref 98–111)
Creatinine, Ser: 2.86 mg/dL — ABNORMAL HIGH (ref 0.61–1.24)
GFR, Estimated: 23 mL/min — ABNORMAL LOW (ref >60)
Glucose, Bld: 106 mg/dL — ABNORMAL HIGH (ref 70–99)
Phosphorus: 4.7 mg/dL — ABNORMAL HIGH (ref 2.5–4.6)
Potassium: 4.4 mmol/L (ref 3.5–5.1)
Sodium: 140 mmol/L (ref 135–145)

## 2022-01-18 LAB — PROTIME-INR
INR: 1.7 — ABNORMAL HIGH (ref 0.8–1.2)
Prothrombin Time: 19.7 seconds — ABNORMAL HIGH (ref 11.4–15.2)

## 2022-01-18 MED ORDER — WARFARIN SODIUM 5 MG PO TABS
5.0000 mg | ORAL_TABLET | Freq: Once | ORAL | Status: AC
  Administered 2022-01-18: 5 mg via ORAL
  Filled 2022-01-18: qty 1

## 2022-01-18 NOTE — Progress Notes (Signed)
ANTICOAGULATION CONSULT NOTE - Follow Up Consult ? ?Pharmacy Consult for Warfarin ?Indication: hx DVT ? ?Patient Measurements: ?Height: 6\' 5"  (195.6 cm) ?Weight: 124.3 kg (274 lb 0.5 oz) ?IBW/kg (Calculated) : 89.1 ? ?Vital Signs: ?Temp: 98.4 ?F (36.9 ?C) (04/01 0932) ?Temp Source: Oral (04/01 0441) ?BP: 143/89 (04/01 0932) ?Pulse Rate: 58 (04/01 0932) ? ?Labs: ?Recent Labs  ?  01/16/22 ?0444 01/17/22 ?2395 01/18/22 ?3202  ?LABPROT 16.2* 17.7* 19.7*  ?INR 1.3* 1.5* 1.7*  ?CREATININE 3.14* 3.01* 2.86*  ? ? ? ?Estimated Creatinine Clearance: 36.1 mL/min (A) (by C-G formula based on SCr of 2.86 mg/dL (H)). ? ?Assessment: ?71 y.o. male admitted 01/13/22 with acute on chronic kidney failure, h/o DVT and supratherapeutic INR. Pharmacy consulted to dose warfarin.  ? ? 3/27: INR 8.7 on admit 3/27 and Vitamin K 1 mg IV given.  ? 3/28: INR 2.7 > warfarin 2.5 mg x 1 given ? 3/29: INR 1.5 > warfarin 4 mg x 1 given ? 3/30: INR 1.3 > warfarin 5 mg x 1 ordered ? 3/31: INR 1.5 > warfarin 5 mg x 1 ordered ? 3/31: INR 1.7> warfarin 5 mg x 1 ordered  ? ? Prior to admission, patient was intended to be on warfarin 2.5 mg daily after discharge 01/03/22, but was taking warfarin 7.5 mg daily.  ? ?Goal of Therapy:  ?INR 2-3 ?Monitor platelets by anticoagulation protocol: Yes ?  ?Plan:  ?Warfarin 5 mg x 1 today. ?Daily PT/INR. ? ?Adria Dill, PharmD ?PGY-1 Acute Care Resident  ?01/18/2022 11:39 AM  ? ? ? ?

## 2022-01-18 NOTE — Progress Notes (Signed)
Patient's brother BJ able to provide transportation tonight for discharge. Discharge instructions reviewed and medications returned from pharmacy. Patient refusing to leave with foley in place. Foley removed, MD Jamse Arn paged and MD Moshe Cipro made aware. Educated patient on importance of quickly following up with urology and nephrology per MD G[place]borough's note.  ?

## 2022-01-18 NOTE — Progress Notes (Signed)
Patient discharged home,picked up by brother. Belongings sent with patient. Discharge papers/instruction already given by day RN. Patient verbalized understanding. ?

## 2022-01-18 NOTE — Progress Notes (Signed)
? ?PROGRESS NOTE ? ? ? ?Thomas [name]  ?ONG:[id number]  ?DOB: 03-Oct-1951  ?DOA: 01/13/2022 ?PCP: Antonietta Jewel, MD ?Outpatient Specialists: ? ? ?Hospital course: ? ?Briefly, patient is a 71 year old man with PMH significant for CKD 4, hypothyroidism, BPH s/p R BKA, DM 2, HFpEF and prior stroke who had recently been discharged after treatment for AKI and was readmitted on 330 with recurrent AKI with mild hyperkalemia of 5.7. ? ?Patient was treated and was discharged yesterday however patient is now challenging discharge. ? ?Subjective: ? ?Patient has no complaints, his main concern is that the nurses have not been ambulating him as much as he would like.  States he would like to walk around the hallways more. ? ? ?Objective: ?Vitals:  ? 01/17/22 1639 01/17/22 1936 01/18/22 0441 01/18/22 0932  ?BP: (!) 142/82 (!) 169/89 (!) 177/88 (!) 143/89  ?Pulse: (!) 57 (!) 58 (!) 57 (!) 58  ?Resp: 18 16 18 18   ?Temp: 98.2 ?F (36.8 ?C) 98.1 ?F (36.7 ?C) 98.2 ?F (36.8 ?C) 98.4 ?F (36.9 ?C)  ?TempSrc:  Oral Oral   ?SpO2: (!) 89% 94% 96% 93%  ?Weight:      ?Height:      ? ? ?Intake/Output Summary (Last 24 hours) at 01/18/2022 1511 ?Last data filed at 01/18/2022 0856 ?Gross per 24 hour  ?Intake 530 ml  ?Output 2100 ml  ?Net -1570 ml  ? ?Filed Weights  ? 01/13/22 1910 01/14/22 1831  ?Weight: 117.5 kg 124.3 kg  ? ? ? ?Exam: ? ?General: Well-appearing man sitting up in bed eating lunch in NAD. ?Eyes: sclera anicteric, conjuctiva mild injection bilaterally ?CVS: S1-S2, regular  ?Respiratory: CTA ?GI: NABS, soft, NT  ?LE: No edema.  ?Neuro: grossly nonfocal.  ? ? ?Assessment & Plan: ?  ?Patient has been discharged after treatment for acute on chronic renal failure. ?Creatinine continues to improve on present management, is close to baseline of 2.3. ?Patient remains off IV fluid ?Continue present management ?Patient remains appropriate for discharge on present medications ? ? ?DVT prophylaxis: Warfarin ?Code Status: Full ?Family Communication:  None ?Disposition Plan:  ? Patient is from: Home ? Anticipated Discharge Location: Home with home PT ? Barriers to Discharge: Patient is challenging discharge, patient is already been discharged ? Is patient medically stable for Discharge: Yes ? ? ?Scheduled Meds: ? amLODipine  10 mg Oral Daily  ? atorvastatin  80 mg Oral Daily  ? Chlorhexidine Gluconate Cloth  6 each Topical Daily  ? cloNIDine  0.1 mg Oral Q8H  ? darbepoetin (ARANESP) injection - NON-DIALYSIS  200 mcg Subcutaneous Q Wed-1800  ? ferrous sulfate  325 mg Oral Q breakfast  ? finasteride  5 mg Oral Daily  ? gabapentin  100 mg Oral TID  ? insulin aspart  0-9 Units Subcutaneous TID AC & HS  ? levothyroxine  50 mcg Oral Q0600  ? metoprolol tartrate  50 mg Oral BID  ? naloxegol oxalate  25 mg Oral Daily  ? oxyCODONE  30 mg Oral Q8H  ? pantoprazole  40 mg Oral BID  ? polyethylene glycol  17 g Oral Daily  ? sodium bicarbonate  650 mg Oral Daily  ? tamsulosin  0.4 mg Oral Daily  ? warfarin  5 mg Oral ONCE-1600  ? Warfarin - Pharmacist Dosing Inpatient   Does not apply G4010  ? ?Continuous Infusions: ? ferric gluconate (FERRLECIT) IVPB 125 mg (01/18/22 0816)  ? ? ?Data Reviewed: ? ?Basic Metabolic Panel: ?Recent Labs  ?Lab 01/13/22 ?2226  01/14/22 ?7588 01/15/22 ?0410 01/16/22 ?3254 01/17/22 ?9826 01/18/22 ?4158  ?NA  --  140 140 141 140 140  ?K  --  4.9 4.9 4.9 4.5 4.4  ?CL  --  107 107 107 106 110  ?CO2  --  22 23 25 25 23   ?GLUCOSE  --  75 109* 112* 116* 106*  ?BUN  --  59* 47* 39* 33* 30*  ?CREATININE  --  3.66* 3.26* 3.14* 3.01* 2.86*  ?CALCIUM  --  6.5* 7.5* 7.4* 7.5* 7.4*  ?MG 1.8 1.8  --   --   --   --   ?PHOS  --   --   --   --  4.9* 4.7*  ? ? ?CBC: ?Recent Labs  ?Lab 01/13/22 ?1958 01/14/22 ?0412 01/15/22 ?0410  ?WBC 5.3 4.3 5.5  ?NEUTROABS 3.4 2.5  --   ?HGB 8.1* 7.4* 8.2*  ?HCT 26.6* 24.3* 26.2*  ?MCV 103.9* 101.7* 100.8*  ?PLT 205 168 178  ? ? ?Studies: ?No results found. ? ?Principal Problem: ?  Acute renal failure superimposed on stage 4 chronic  kidney disease ([place]) ?Active Problems: ?  Chest pain ?  Hyperkalemia ?  Gabapentin-induced toxicity ?  Deep vein thrombosis (DVT) of left lower extremity (HCC) ?  Type 2 diabetes mellitus with stage 4 chronic kidney disease, without long-term current use of insulin ([place]) ?  Chronic combined systolic and diastolic congestive heart failure ([place]) ?  Essential hypertension ?  Hypothyroidism ?  Open wound of left heel ?  BPH (benign prostatic hyperplasia) ? ? ? ? ?Dewaine Oats Derek Jack, ?[facility] ? ?If 7PM-7AM, please contact night-coverage ?[email or web address] ? ? LOS: 5 days   ? ? ?

## 2022-01-18 NOTE — TOC Progression Note (Addendum)
Transition of Care (TOC) - Progression Note  ? ? ?Patient Details  ?Name: Thomas [name] ?MRN: [id number] ?Date of Birth: 01-Mar-1951 ? ?Transition of Care (TOC) CM/SW Contact  ?Bartholomew Crews, RN ?Phone Number: [phone] ?01/18/2022, 7:50 AM ? ?Clinical Narrative:    ? ?Verified this AM on kepro website that appeal documents received and patient case is under clinical review at this time. TOC following.  ? ?Update 1125: [employer] website indicates case is pending physician review.  ? ?Update 1513: [employer] still pending physician review ? ?Expected Discharge Plan: [place] ?Barriers to Discharge: Continued Medical Work up ? ?Expected Discharge Plan and Services ?Expected Discharge Plan: [place] ?  ?Discharge Planning Services: CM Consult ?Post Acute Care Choice: Home Health ?Living arrangements for the past 2 months: Apartment ?Expected Discharge Date: 01/17/22               ?  ?  ?  ?  ?  ?HH Arranged: RN, PT, OT ?[place] Agency: [facility] ?Date HH Agency Contacted: 01/15/22 ?Time [place]: 3086 ?Representative spoke with at [place]: Delsa Sale ? ? ?Social Determinants of Health (SDOH) Interventions ?  ? ?Readmission Risk Interventions ? ?  01/15/2022  ?  2:56 PM 01/02/2022  ?  2:03 PM 12/31/2021  ? 10:32 AM  ?Readmission Risk Prevention Plan  ?Transportation Screening Complete Complete Complete  ?Medication Review Press photographer) Complete Complete Complete  ?PCP or Specialist appointment within 3-5 days of discharge Complete Complete Complete  ?[place] or Home Care Consult Complete Complete Complete  ?SW Recovery Care/Counseling Consult Complete Complete Complete  ?Palliative Care Screening Not Applicable Not Applicable Not Applicable  ?[place] Not Applicable Not Applicable Not Applicable  ? ? ?

## 2022-01-18 NOTE — TOC Progression Note (Signed)
Transition of Care (TOC) - Progression Note  ? ? ?Patient Details  ?Name: Thomas [name] ?MRN: [id number] ?Date of Birth: [birth year] ? ?Transition of Care (TOC) CM/SW Contact  ?Bartholomew Crews, RN ?Phone Number: [phone] ?01/18/2022, 4:53 PM ? ?Clinical Narrative:    ? ?Notified of appeal decision. [facility]IO agreed that patient is ready for discharge. Spoke with patient at the bedside to discuss appeal decision and advise that he needs to transition home by noon tomorrow.  ? ?Patient stated that he has transportation home and for medical appointments. While he does live alone, he has people he can reach out to if needed. Discussed continuation of [place] RN, PT services through [facility]. Liaison on [facility] notified of pending transition home.  ? ?Nursing aware of appeal decision. Message left for patient's brother, BJ, with request to call back either NCM or bedside RN.  ? ?TOC following for discharge needs.  ? ?Expected Discharge Plan: [place] ?Barriers to Discharge: Continued Medical Work up ? ?Expected Discharge Plan and Services ?Expected Discharge Plan: [place] ?  ?Discharge Planning Services: CM Consult ?Post Acute Care Choice: Home Health ?Living arrangements for the past 2 months: Apartment ?Expected Discharge Date: 01/17/22               ?  ?  ?  ?  ?  ?HH Arranged: RN, PT ?[place] Agency: [facility] ?Date HH Agency Contacted: 01/18/22 ?Time [place]: 2426 ?Representative spoke with at [place]: Delsa Sale ? ? ?Social Determinants of Health (SDOH) Interventions ?  ? ?Readmission Risk Interventions ? ?  01/15/2022  ?  2:56 PM 01/02/2022  ?  2:03 PM 12/31/2021  ? 10:32 AM  ?Readmission Risk Prevention Plan  ?Transportation Screening Complete Complete Complete  ?Medication Review Press photographer) Complete Complete Complete  ?PCP or Specialist appointment within 3-5 days of discharge Complete Complete Complete  ?[place] or Home Care Consult Complete Complete Complete  ?SW Recovery  Care/Counseling Consult Complete Complete Complete  ?Palliative Care Screening Not Applicable Not Applicable Not Applicable  ?[place] Not Applicable Not Applicable Not Applicable  ? ? ?

## 2022-02-08 DIAGNOSIS — I340 Nonrheumatic mitral (valve) insufficiency: Secondary | ICD-10-CM | POA: Diagnosis not present

## 2022-02-08 DIAGNOSIS — I361 Nonrheumatic tricuspid (valve) insufficiency: Secondary | ICD-10-CM

## 2022-03-27 ENCOUNTER — Emergency Department (HOSPITAL_COMMUNITY): Payer: Medicare Other

## 2022-03-27 ENCOUNTER — Emergency Department (HOSPITAL_COMMUNITY)
Admission: EM | Admit: 2022-03-27 | Discharge: 2022-03-27 | Disposition: A | Discharge status: Home / Self Care | Payer: Medicare Other | Attending: Emergency Medicine | Admitting: Emergency Medicine

## 2022-03-27 ENCOUNTER — Other Ambulatory Visit: Payer: Self-pay

## 2022-03-27 ENCOUNTER — Encounter (HOSPITAL_COMMUNITY): Payer: Self-pay

## 2022-03-27 DIAGNOSIS — N189 Chronic kidney disease, unspecified: Secondary | ICD-10-CM | POA: Diagnosis not present

## 2022-03-27 DIAGNOSIS — Z7984 Long term (current) use of oral hypoglycemic drugs: Secondary | ICD-10-CM | POA: Diagnosis not present

## 2022-03-27 DIAGNOSIS — Z7982 Long term (current) use of aspirin: Secondary | ICD-10-CM | POA: Insufficient documentation

## 2022-03-27 DIAGNOSIS — Z86711 Personal history of pulmonary embolism: Secondary | ICD-10-CM | POA: Insufficient documentation

## 2022-03-27 DIAGNOSIS — X58XXXA Exposure to other specified factors, initial encounter: Secondary | ICD-10-CM | POA: Diagnosis not present

## 2022-03-27 DIAGNOSIS — E119 Type 2 diabetes mellitus without complications: Secondary | ICD-10-CM | POA: Insufficient documentation

## 2022-03-27 DIAGNOSIS — Z7901 Long term (current) use of anticoagulants: Secondary | ICD-10-CM | POA: Insufficient documentation

## 2022-03-27 DIAGNOSIS — S81812A Laceration without foreign body, left lower leg, initial encounter: Secondary | ICD-10-CM | POA: Insufficient documentation

## 2022-03-27 DIAGNOSIS — S8992XA Unspecified injury of left lower leg, initial encounter: Secondary | ICD-10-CM | POA: Diagnosis present

## 2022-03-27 LAB — CBC WITH DIFFERENTIAL/PLATELET
Abs Immature Granulocytes: 0.02 10*3/uL (ref 0.00–0.07)
Basophils Absolute: 0 10*3/uL (ref 0.0–0.1)
Basophils Relative: 0 %
Eosinophils Absolute: 0.2 10*3/uL (ref 0.0–0.5)
Eosinophils Relative: 3 %
HCT: 26.4 % — ABNORMAL LOW (ref 39.0–52.0)
Hemoglobin: 8.6 g/dL — ABNORMAL LOW (ref 13.0–17.0)
Immature Granulocytes: 0 %
Lymphocytes Relative: 16 %
Lymphs Abs: 1.1 10*3/uL (ref 0.7–4.0)
MCH: 33.6 pg (ref 26.0–34.0)
MCHC: 32.6 g/dL (ref 30.0–36.0)
MCV: 103.1 fL — ABNORMAL HIGH (ref 80.0–100.0)
Monocytes Absolute: 0.5 10*3/uL (ref 0.1–1.0)
Monocytes Relative: 7 %
Neutro Abs: 5 10*3/uL (ref 1.7–7.7)
Neutrophils Relative %: 74 %
Platelets: 195 10*3/uL (ref 150–400)
RBC: 2.56 MIL/uL — ABNORMAL LOW (ref 4.22–5.81)
RDW: 16 % — ABNORMAL HIGH (ref 11.5–15.5)
WBC: 6.8 10*3/uL (ref 4.0–10.5)
nRBC: 0 % (ref 0.0–0.2)

## 2022-03-27 LAB — BASIC METABOLIC PANEL
Anion gap: 9 (ref 5–15)
BUN: 47 mg/dL — ABNORMAL HIGH (ref 8–23)
CO2: 23 mmol/L (ref 22–32)
Calcium: 7.8 mg/dL — ABNORMAL LOW (ref 8.9–10.3)
Chloride: 105 mmol/L (ref 98–111)
Creatinine, Ser: 3.5 mg/dL — ABNORMAL HIGH (ref 0.61–1.24)
GFR, Estimated: 18 mL/min — ABNORMAL LOW (ref >60)
Glucose, Bld: 142 mg/dL — ABNORMAL HIGH (ref 70–99)
Potassium: 5.6 mmol/L — ABNORMAL HIGH (ref 3.5–5.1)
Sodium: 137 mmol/L (ref 135–145)

## 2022-03-27 LAB — PROTIME-INR
INR: 1.6 — ABNORMAL HIGH (ref 0.8–1.2)
Prothrombin Time: 19 seconds — ABNORMAL HIGH (ref 11.4–15.2)

## 2022-03-27 IMAGING — CR DG FOOT COMPLETE 3+V*L*
3 series · 3 of 3 positions shown · non-contrast
Comparison: MRI left foot [DATE]

CLINICAL DATA: assess for osteo. bleeding from the left second toe.
Patient had the left lower leg wrapped. EMS unwrapped and reports
swelling, weeping, and redness.

EXAM:
LEFT FOOT - COMPLETE 3+ VIEW

[x foot lat left (1 of 2)]
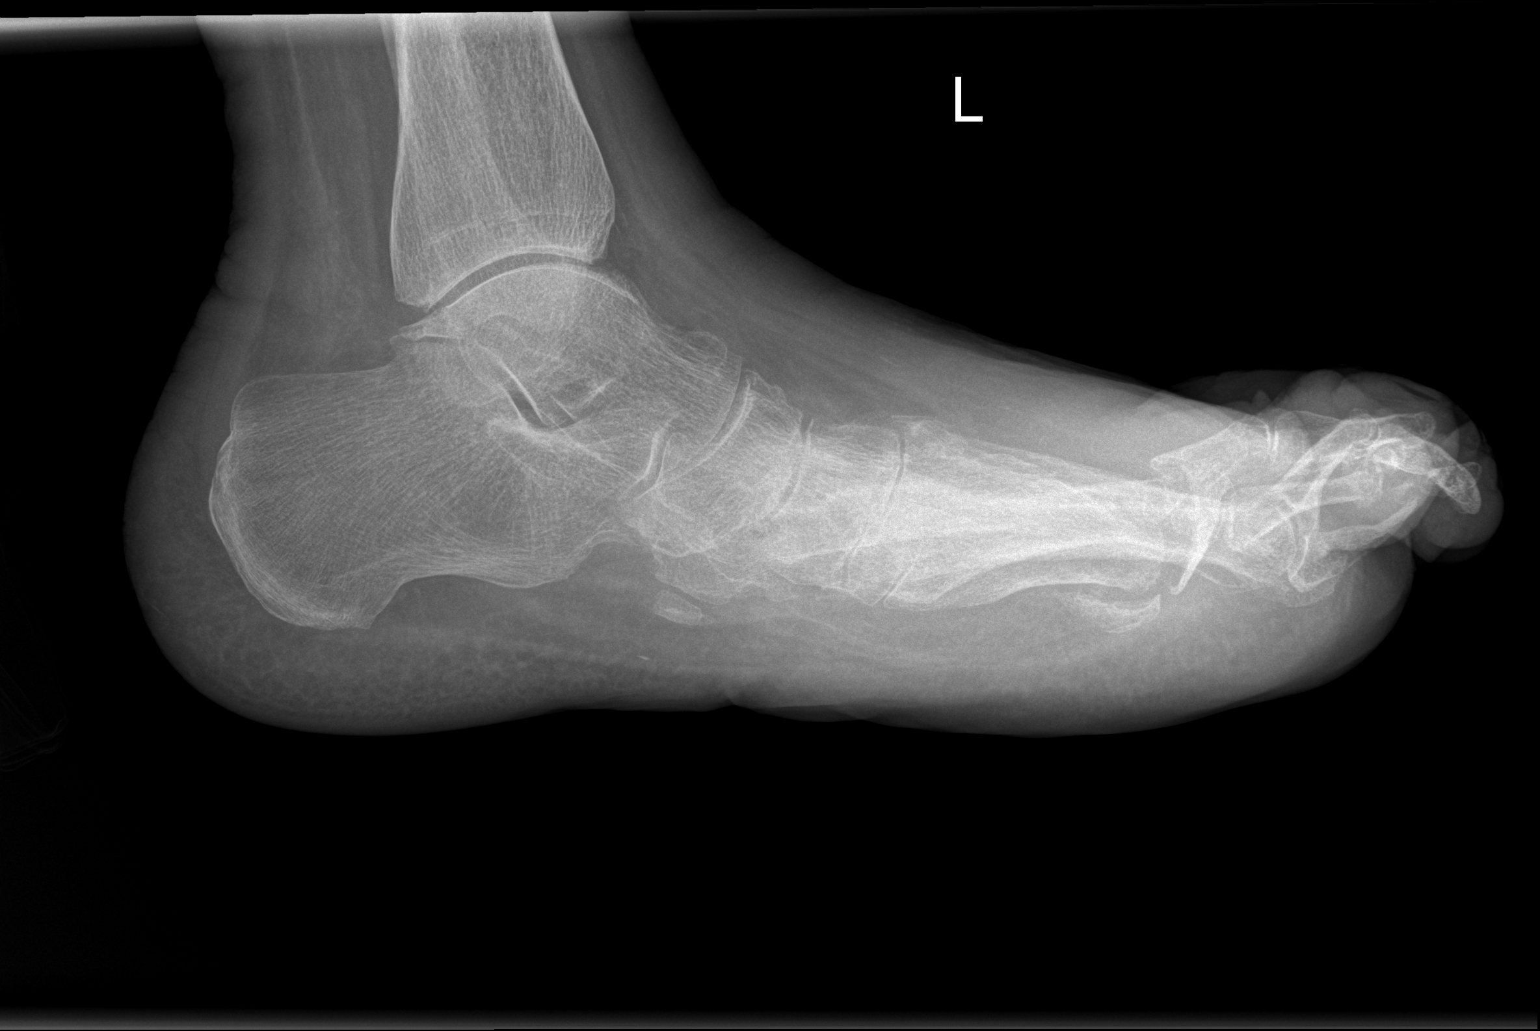

[x foot lat left (2 of 2)]
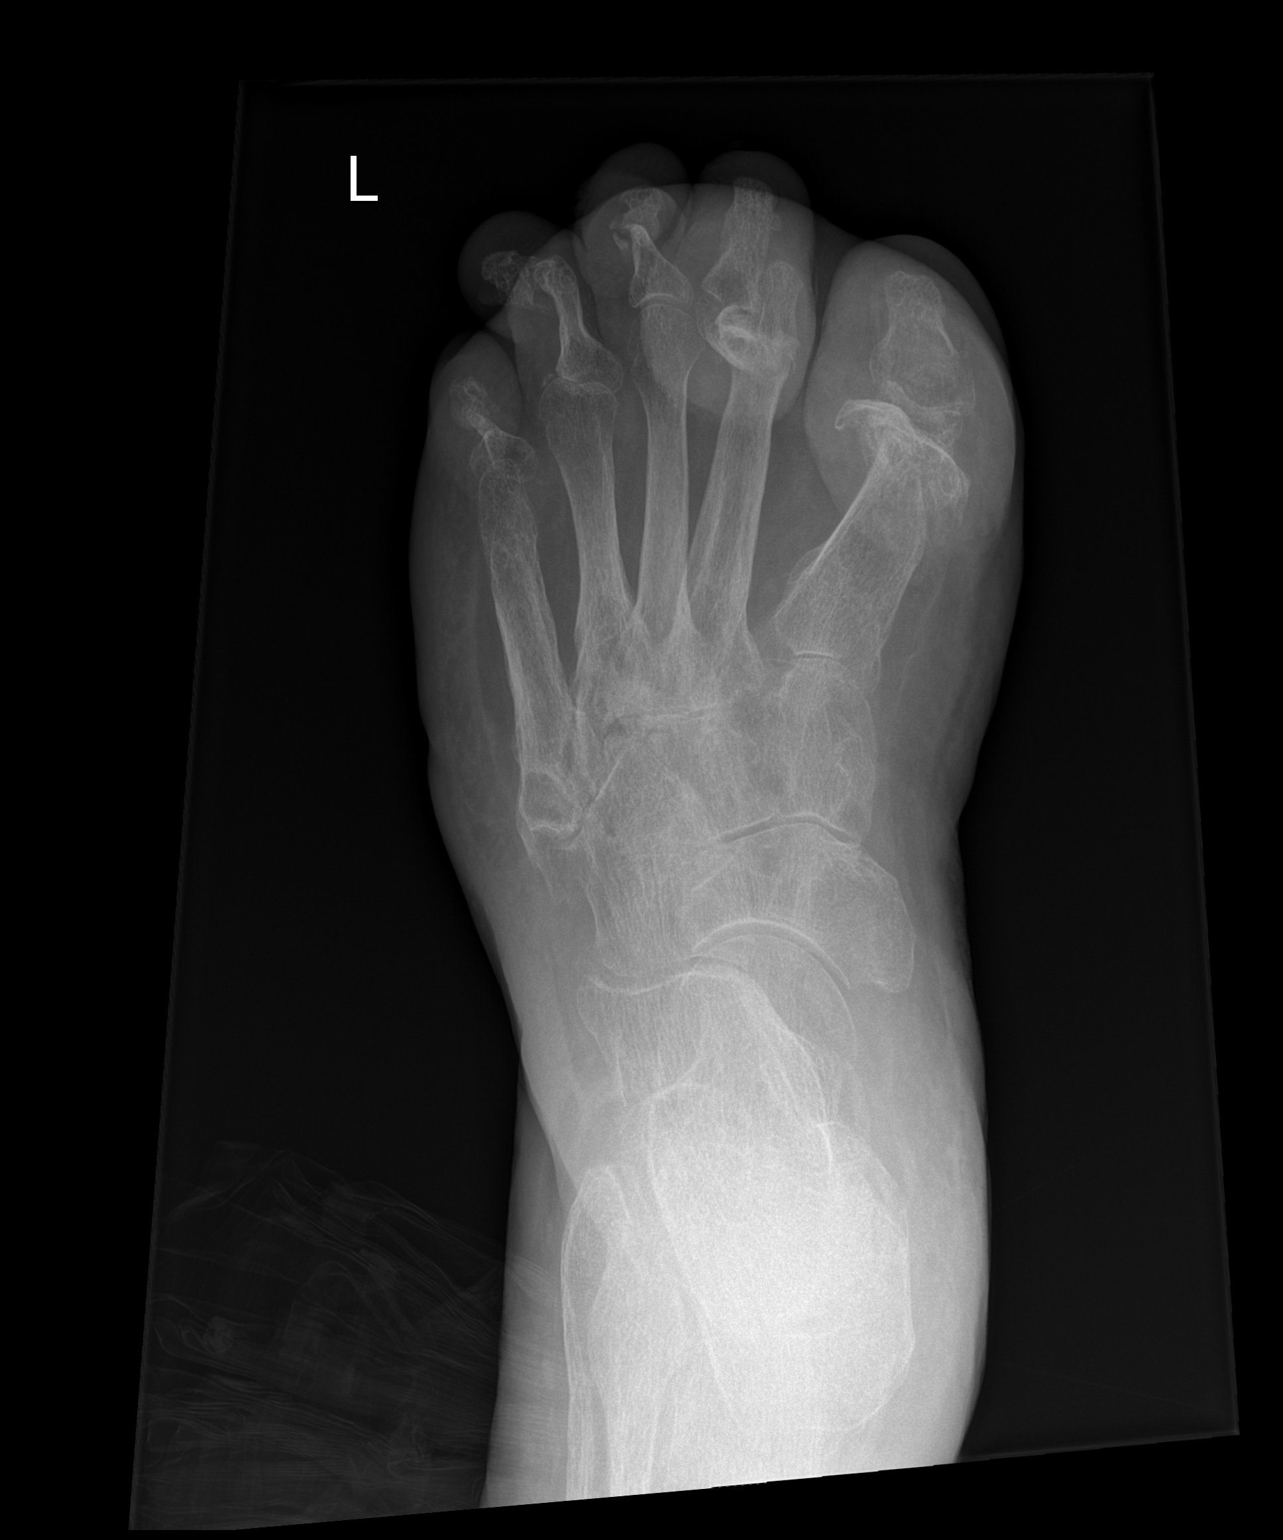

[x foot obl left]
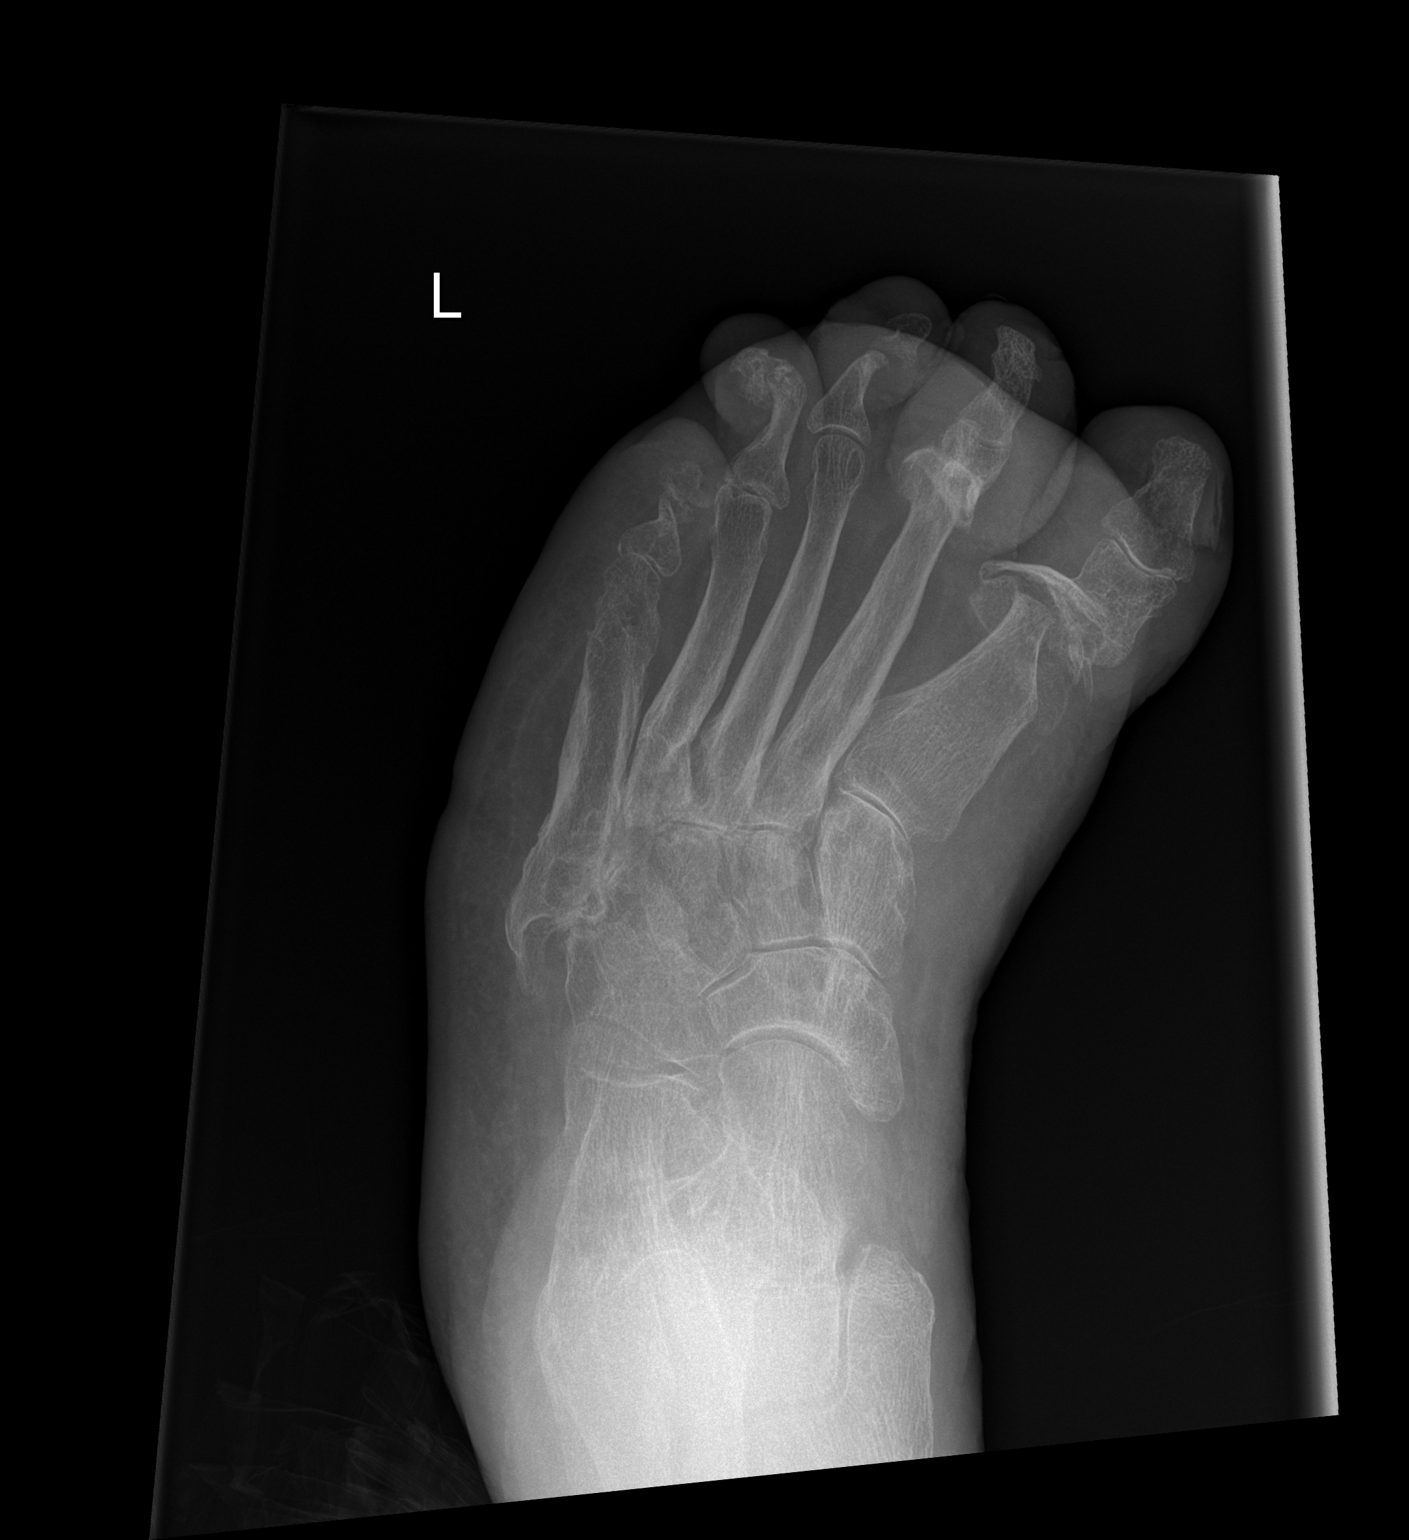

[3 of 3 positions shown; findings below may reference images not displayed]

FINDINGS: Pes planus. Persistent severe arthritis with chronic erosive changes
of the phalanges and metatarsals. No cortical erosion or
destruction. Limited evaluation due to overlapping osseous
structures and overlying soft tissues. There is no evidence of
fracture or dislocation. There is no evidence of arthropathy or
other focal bone abnormality. Soft tissues are unremarkable.
IMPRESSION: 1. No radiographic findings to suggest osteomyelitis. Limited
evaluation due to overlapping osseous structures and overlying soft
tissues. If high clinical suspicion, please consider MRI for further
evaluation (with intravenous contrast if GFR greater than 30).
2.  No acute displaced fracture or dislocation.
3. Persistent severe arthritis with chronic erosive changes of the
phalanges and metatarsals.
4. Pes planus.

## 2022-03-27 MED ORDER — SODIUM ZIRCONIUM CYCLOSILICATE 10 G PO PACK
10.0000 g | PACK | Freq: Once | ORAL | Status: AC
  Administered 2022-03-27: 10 g via ORAL
  Filled 2022-03-27: qty 1

## 2022-03-27 NOTE — ED Provider Notes (Signed)
[facility] DEPT Provider Note   CSN: [id number] Arrival date & time: 03/27/22  1613     History  No chief complaint on file.   Thomas [name] is a 71 y.o. male.  The history is provided by the patient and medical records. No language interpreter was used.    71 year old male with multiple comorbidity which include an chronic pain syndrome, CKD, diabetes, PE on warfarin, right BKA, recurrent left leg cellulitis brought here via EMS from home for evaluation of bleeding from left foot.  Patient report he was at his kidney doctor's office for his regular visit today when he noticed blood in his socks on the left foot.  Someone inspected and noticed blood coming from his second toe.  He is on blood thinner medication and therefore he was brought here for further evaluation.  He mentioned he has blood clot in his left leg.  He is taking his medication as prescribed.  He does not complain of any chest pain or shortness of breath.  No fever.  He mention he has noticed swelling to his affected toe but it has been improving.  He denies any specific injury to his foot.  He has a right BKA  Home Medications Prior to Admission medications   Medication Sig Start Date End Date Taking? Authorizing Provider  acetaminophen (TYLENOL) 650 MG CR tablet Take 1,300 mg by mouth every 8 (eight) hours as needed for pain.    [provider]  allopurinol (ZYLOPRIM) 100 MG tablet Take 100 mg by mouth 2 (two) times daily.    [provider]  amLODipine (NORVASC) 5 MG tablet Take 5 mg by mouth daily.    [provider]  atorvastatin (LIPITOR) 80 MG tablet Take 80 mg by mouth daily.    [provider]  bisacodyl (DULCOLAX) 10 MG suppository Place 10 mg rectally daily as needed for mild constipation.    [provider]  cefdinir (OMNICEF) 300 MG capsule Take 1 capsule (300 mg total) by mouth every 12 (twelve) hours. Patient taking differently:  Take 300 mg by mouth every 12 (twelve) hours. 3 DS 01/03/22   Nita Sells, MD  cloNIDine (CATAPRES) 0.1 MG tablet Take 0.1 mg by mouth 3 (three) times daily.    [provider]  colchicine 0.6 MG tablet Take 0.6 mg by mouth daily as needed.    [provider]  ferrous sulfate 325 (65 FE) MG tablet Take 1 tablet (325 mg total) by mouth daily with breakfast. 12/15/21 01/14/22  Antonieta Pert, MD  finasteride (PROSCAR) 5 MG tablet Take 1 tablet (5 mg total) by mouth daily. 10/10/19   Hosie Poisson, MD  gabapentin (NEURONTIN) 100 MG capsule Take 1 capsule (100 mg total) by mouth 3 (three) times daily. Patient not taking: Reported on 01/14/2022 12/24/21   Nita Sells, MD  glipiZIDE (GLUCOTROL) 10 MG tablet Take 20 mg by mouth daily as needed (if BG > 120). only takes if BG > 120    [provider]  lactulose (CHRONULAC) 10 GM/15ML solution Take by mouth 2 (two) times daily as needed (constipation).    [provider]  levothyroxine (SYNTHROID) 50 MCG tablet Take 50 mcg by mouth daily before breakfast.     [provider]  metoprolol tartrate (LOPRESSOR) 50 MG tablet Take 1 tablet (50 mg total) by mouth 2 (two) times daily. 12/24/21   Nita Sells, MD  naloxegol oxalate (MOVANTIK) 25 MG TABS tablet Take 25 mg by  mouth daily. Patient not taking: Reported on 01/14/2022    [provider]  NEURONTIN 300 MG capsule Take 300 mg by mouth 3 (three) times daily. 01/04/22   [provider]  OXYCONTIN 30 MG 12 hr tablet Take 30 mg by mouth 3 (three) times daily as needed (pain). 12/30/21   [provider]  pantoprazole (PROTONIX) 40 MG tablet Take 1 tablet by mouth 2 times daily. Patient taking differently: Take 40 mg by mouth 2 (two) times daily as needed (acid reflux). 10/01/21 02/22/22  Kayleen Memos, DO  polyethylene glycol (MIRALAX / GLYCOLAX) 17 g packet Take 17 g by mouth daily. Patient taking differently: Take 17 g by mouth  daily as needed for mild constipation. 01/04/22   Nita Sells, MD  sodium bicarbonate 650 MG tablet Take 650 mg by mouth daily.    [provider]  sodium zirconium cyclosilicate (LOKELMA) 10 g PACK packet Take 10 g by mouth daily. Patient not taking: Reported on 01/14/2022 12/24/21   Nita Sells, MD  tamsulosin (FLOMAX) 0.4 MG CAPS capsule Take 1 capsule (0.4 mg total) by mouth daily. Patient taking differently: Take 0.4 mg by mouth 2 (two) times daily. 09/25/20   Jose Persia, MD  tiZANidine (ZANAFLEX) 4 MG tablet Take 4 mg by mouth every 8 (eight) hours as needed for muscle spasms. 11/26/21   [provider]  warfarin (COUMADIN) 5 MG tablet Take 0.5 tablets (2.5 mg total) by mouth daily. Patient taking differently: Take 7.5 mg by mouth daily at 4 PM. 01/03/22   Nita Sells, MD      Allergies    Other, Oxymorphone, Aspirin, Beta vulgaris, Buspirone, Cabbage, Codeine, Diflunisal, Fish allergy, Fish-derived products, Hydroxyzine, Methadone, Pentazocine, Propoxyphene, Shellfish allergy, Sulfa antibiotics, Sulfasalazine, and Amoxicillin    Review of Systems   Review of Systems  All other systems reviewed and are negative.   Physical Exam Updated Vital Signs BP (!) 158/92 (BP Location: Left Arm)   Pulse 62   Temp 98.6 F (37 C) (Oral)   Resp 18   SpO2 91%  Physical Exam Vitals and nursing note reviewed.  Constitutional:      General: He is not in acute distress.    Appearance: He is well-developed.  HENT:     Head: Atraumatic.  Eyes:     Conjunctiva/sclera: Conjunctivae normal.  Musculoskeletal:        General: Signs of injury (Left foot: Small skin tear noted to the tip of his second toe with dried blood but no active bleeding.  Several toes are edematous but not erythematous and no tenderness to palpation.  Intact dorsalis pedis pulse.  2+ pitting edema noted to LLE) present.     Cervical back: Neck supple.  Skin:    Findings: No rash.   Neurological:     Mental Status: He is alert.    ED Results / Procedures / Treatments   Labs (all labs ordered are listed, but only abnormal results are displayed) Labs Reviewed  BASIC METABOLIC PANEL - Abnormal; Notable for the following components:      Result Value   Potassium 5.6 (*)    Glucose, Bld 142 (*)    BUN 47 (*)    Creatinine, Ser 3.50 (*)    Calcium 7.8 (*)    GFR, Estimated 18 (*)    All other components within normal limits  CBC WITH DIFFERENTIAL/PLATELET - Abnormal; Notable for the following components:   RBC 2.56 (*)    Hemoglobin 8.6 (*)  HCT 26.4 (*)    MCV 103.1 (*)    RDW 16.0 (*)    All other components within normal limits  PROTIME-INR - Abnormal; Notable for the following components:   Prothrombin Time 19.0 (*)    INR 1.6 (*)    All other components within normal limits    EKG None  Radiology DG Foot Complete Left  Result Date: 03/27/2022 CLINICAL DATA:  assess for osteo. bleeding from the left second toe. Patient had the left lower leg wrapped. EMS unwrapped and reports swelling, weeping, and redness. EXAM: LEFT FOOT - COMPLETE 3+ VIEW COMPARISON:  MRI left foot 09/17/2021 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 No cortical erosion or destruction. Limited evaluation due to overlapping osseous structures and overlying soft tissues. There is no evidence of fracture or dislocation. There is no evidence of arthropathy or other focal bone abnormality. Soft tissues are unremarkable. IMPRESSION: 1. No radiographic findings to suggest osteomyelitis. Limited evaluation due to overlapping osseous structures and overlying soft tissues. If high clinical suspicion, please consider MRI for further evaluation (with intravenous contrast if GFR greater than 30). 2.  No acute displaced fracture or dislocation. 3. Persistent severe arthritis with chronic erosive changes of the phalanges and  metatarsals. 4. Pes planus. Electronically Signed   By: Iven Finn M.D.   On: 03/27/2022 17:10    Procedures Procedures    Medications Ordered in ED Medications  sodium zirconium cyclosilicate (LOKELMA) packet 10 g (10 g Oral Given 03/27/22 1942)    ED Course/ Medical Decision Making/ A&P                           Medical Decision Making Amount and/or Complexity of Data Reviewed Labs: ordered. Radiology: ordered.  Risk Prescription drug management.   BP (!) 158/92 (BP Location: Left Arm)   Pulse 62   Temp 98.6 F (37 C) (Oral)   Resp 18   SpO2 91%   5:16 PM This is a 71 year old male with multiple comorbidities which includes CKD, diabetes, PE DVT on warfarin, right BKA, presenting with complaint of bleeding coming from his left foot.  He is unsure if he may have injured his foot but did notice some blood on his socks today when he went to have a regular visit with his kidney doctor.  He is on blood thinner medication therefore he was brought here for further assessment.  He mention he has blood clot in his left leg being treated with Coumadin.  He reports being compliant with his medication.  He does not endorse any chest pain or shortness of breath no fever or chills.  He mention noticing swelling to his left second toe for a while but it is improving.  On exam this is an elderly male, chronically ill-appearing but appears to be in no acute discomfort.  He has a right BKA wearing a prosthetic leg.  His left foot was exam by me.  He has a lot of dry skin noted to left lower extremity, there is a small skin tear noted to the tip of his left second toe not actively bleeding.  The toe was edematous but appear chronic in nature without any signs of cellulitis.  He has intact distal pulses.  7:11 PM Labs obtained independently viewed interpreted by me.  Nurse notified that patient was a difficult stick to obtain labs, his potassium is elevated at 5.6.  He has had high potassium in  the past.  His creatinine is 3.5, it has been elevated in the past similar to this level as well.  He does have history of chronic kidney disease.  I suspect a lot of this is chronic in nature but felt he would benefit from [facility] to help decrease his potassium level and he would likely need to have his left potassium level rechecked outpatient in next few days.  His hemoglobin is 8.6, improved from baseline.  X-ray of his left foot was independently visualized and interpreted by me and I agree with radiologist interpretation.  Fortunately no obvious signs to suggest osteomyelitis and no acute displaced fracture or dislocation.  There are chronic changes noted but unchanged from prior.  At this time, I have low suspicion for osteomyelitis or cellulitis involving his left lower extremity.  Plan to clean his his wound, apply dressing, and will also Ace wrap his leg per patient request.  9:01 PM Patient's INR is subtherapeutic at 1.6.  I encourage patient to double his dose tomorrow and resume normal dose and have it rechecked at his earliest convenience.  At this time I felt patient is stable for discharge.  Outpatient follow-up recommended.  Return precaution given.  Care discussed with DR. Belfi.  I have considered admission however patient does not have obvious signs of cellulitis, he does not appear septic, his bleeding has stopped without specific intervention and overall he is safe to go home.        Final Clinical Impression(s) / ED Diagnoses Final diagnoses:  Skin tear of left lower leg without complication, initial encounter    Rx / DC Orders ED Discharge Orders     None         Domenic Moras, PA-C 03/27/22 2105    Malvin Johns, MD 03/27/22 2326

## 2022-03-27 NOTE — ED Triage Notes (Signed)
Per EMS- Patient is from home. Patient called EMS and states he had bleeding from the left second toe. Patient had the left lower leg wrapped. EMS unwrapped and reports swelling, weeping, and redness.

## 2022-03-27 NOTE — Discharge Instructions (Signed)
You have been evaluated for your bleeding from your left second toe.  Fortunately x-ray did not show any concerning finding.  There are no evidence of cellulitis involving your foot.  Please follow-up closely with your doctor for further care.  Please note your potassium level was elevated today at 5.6.  You have received medication to help reduce your potassium level but this will need to be rechecked in the next several days.  Please have your Coumadin level rechecked at your earliest convenience as the range is less than optimal.

## 2024-01-01 ENCOUNTER — Emergency Department (HOSPITAL_COMMUNITY)

## 2024-01-01 ENCOUNTER — Emergency Department (HOSPITAL_COMMUNITY)
Admission: EM | Admit: 2024-01-01 | Discharge: 2024-01-06 | Disposition: A | Discharge status: Other Acute Inpatient Hospital | Attending: Student | Admitting: Student

## 2024-01-01 DIAGNOSIS — E1122 Type 2 diabetes mellitus with diabetic chronic kidney disease: Secondary | ICD-10-CM | POA: Insufficient documentation

## 2024-01-01 DIAGNOSIS — Z79899 Other long term (current) drug therapy: Secondary | ICD-10-CM | POA: Diagnosis not present

## 2024-01-01 DIAGNOSIS — Z992 Dependence on renal dialysis: Secondary | ICD-10-CM | POA: Insufficient documentation

## 2024-01-01 DIAGNOSIS — M79601 Pain in right arm: Secondary | ICD-10-CM | POA: Insufficient documentation

## 2024-01-01 DIAGNOSIS — I120 Hypertensive chronic kidney disease with stage 5 chronic kidney disease or end stage renal disease: Secondary | ICD-10-CM | POA: Insufficient documentation

## 2024-01-01 DIAGNOSIS — Z7901 Long term (current) use of anticoagulants: Secondary | ICD-10-CM | POA: Insufficient documentation

## 2024-01-01 DIAGNOSIS — R109 Unspecified abdominal pain: Secondary | ICD-10-CM | POA: Diagnosis present

## 2024-01-01 DIAGNOSIS — N186 End stage renal disease: Secondary | ICD-10-CM | POA: Diagnosis not present

## 2024-01-01 LAB — CBC WITH DIFFERENTIAL/PLATELET
Abs Immature Granulocytes: 0.02 10*3/uL (ref 0.00–0.07)
Basophils Absolute: 0 10*3/uL (ref 0.0–0.1)
Basophils Relative: 1 %
Eosinophils Absolute: 0.2 10*3/uL (ref 0.0–0.5)
Eosinophils Relative: 4 %
HCT: 26.4 % — ABNORMAL LOW (ref 39.0–52.0)
Hemoglobin: 8.5 g/dL — ABNORMAL LOW (ref 13.0–17.0)
Immature Granulocytes: 0 %
Lymphocytes Relative: 25 %
Lymphs Abs: 1.2 10*3/uL (ref 0.7–4.0)
MCH: 33.3 pg (ref 26.0–34.0)
MCHC: 32.2 g/dL (ref 30.0–36.0)
MCV: 103.5 fL — ABNORMAL HIGH (ref 80.0–100.0)
Monocytes Absolute: 0.5 10*3/uL (ref 0.1–1.0)
Monocytes Relative: 11 %
Neutro Abs: 3 10*3/uL (ref 1.7–7.7)
Neutrophils Relative %: 59 %
Platelets: 176 10*3/uL (ref 150–400)
RBC: 2.55 MIL/uL — ABNORMAL LOW (ref 4.22–5.81)
RDW: 17.4 % — ABNORMAL HIGH (ref 11.5–15.5)
WBC: 5.1 10*3/uL (ref 4.0–10.5)
nRBC: 0 % (ref 0.0–0.2)

## 2024-01-01 LAB — COMPREHENSIVE METABOLIC PANEL
ALT: 8 U/L (ref 0–44)
AST: 21 U/L (ref 15–41)
Albumin: 3.1 g/dL — ABNORMAL LOW (ref 3.5–5.0)
Alkaline Phosphatase: 52 U/L (ref 38–126)
Anion gap: 11 (ref 5–15)
BUN: 16 mg/dL (ref 8–23)
CO2: 29 mmol/L (ref 22–32)
Calcium: 8.2 mg/dL — ABNORMAL LOW (ref 8.9–10.3)
Chloride: 96 mmol/L — ABNORMAL LOW (ref 98–111)
Creatinine, Ser: 5.88 mg/dL — ABNORMAL HIGH (ref 0.61–1.24)
GFR, Estimated: 10 mL/min — ABNORMAL LOW (ref >60)
Glucose, Bld: 79 mg/dL (ref 70–99)
Potassium: 4.3 mmol/L (ref 3.5–5.1)
Sodium: 136 mmol/L (ref 135–145)
Total Bilirubin: 0.6 mg/dL (ref 0.0–1.2)
Total Protein: 8 g/dL (ref 6.5–8.1)

## 2024-01-01 MED ORDER — OXYCODONE-ACETAMINOPHEN 5-325 MG PO TABS
2.0000 | ORAL_TABLET | Freq: Once | ORAL | Status: AC
  Administered 2024-01-01: 2 via ORAL
  Filled 2024-01-01: qty 2

## 2024-01-01 MED ORDER — TAMSULOSIN HCL 0.4 MG PO CAPS
0.4000 mg | ORAL_CAPSULE | Freq: Every day | ORAL | Status: DC
  Administered 2024-01-02 – 2024-01-06 (×5): 0.4 mg via ORAL
  Filled 2024-01-01 (×5): qty 1

## 2024-01-01 MED ORDER — CALCITRIOL 0.25 MCG PO CAPS
0.2500 ug | ORAL_CAPSULE | Freq: Every day | ORAL | Status: DC
  Administered 2024-01-02 – 2024-01-06 (×5): 0.25 ug via ORAL
  Filled 2024-01-01 (×6): qty 1

## 2024-01-01 MED ORDER — AMLODIPINE BESYLATE 5 MG PO TABS
5.0000 mg | ORAL_TABLET | Freq: Every day | ORAL | Status: DC
  Administered 2024-01-02 – 2024-01-06 (×4): 5 mg via ORAL
  Filled 2024-01-01 (×5): qty 1

## 2024-01-01 MED ORDER — HYDRALAZINE HCL 50 MG PO TABS
100.0000 mg | ORAL_TABLET | Freq: Three times a day (TID) | ORAL | Status: DC
  Administered 2024-01-02 – 2024-01-06 (×11): 100 mg via ORAL
  Filled 2024-01-01 (×11): qty 2

## 2024-01-01 MED ORDER — ONDANSETRON HCL 4 MG/2ML IJ SOLN
4.0000 mg | Freq: Once | INTRAMUSCULAR | Status: AC
Start: 2024-01-01 — End: 2024-01-01
  Administered 2024-01-01: 4 mg via INTRAVENOUS
  Filled 2024-01-01: qty 2

## 2024-01-01 MED ORDER — FINASTERIDE 5 MG PO TABS
5.0000 mg | ORAL_TABLET | Freq: Every day | ORAL | Status: DC
  Administered 2024-01-03 – 2024-01-06 (×4): 5 mg via ORAL
  Filled 2024-01-01 (×4): qty 1

## 2024-01-01 MED ORDER — OXYCODONE HCL ER 10 MG PO T12A
30.0000 mg | EXTENDED_RELEASE_TABLET | Freq: Two times a day (BID) | ORAL | Status: DC
  Administered 2024-01-02 – 2024-01-06 (×10): 30 mg via ORAL
  Filled 2024-01-01 (×10): qty 3

## 2024-01-01 MED ORDER — ATORVASTATIN CALCIUM 80 MG PO TABS
80.0000 mg | ORAL_TABLET | Freq: Every day | ORAL | Status: DC
  Administered 2024-01-02 – 2024-01-06 (×5): 80 mg via ORAL
  Filled 2024-01-01 (×4): qty 1
  Filled 2024-01-01: qty 2

## 2024-01-01 MED ORDER — LEVOTHYROXINE SODIUM 50 MCG PO TABS
50.0000 ug | ORAL_TABLET | Freq: Every day | ORAL | Status: DC
  Administered 2024-01-02 – 2024-01-06 (×5): 50 ug via ORAL
  Filled 2024-01-01 (×5): qty 1

## 2024-01-01 MED ORDER — CEPHALEXIN 500 MG PO CAPS: 500.0000 mg | ORAL_CAPSULE | Freq: Two times a day (BID) | ORAL | 0 refills | Status: DC

## 2024-01-01 MED ORDER — HYDROMORPHONE HCL 1 MG/ML IJ SOLN
1.0000 mg | Freq: Once | INTRAMUSCULAR | Status: AC
  Administered 2024-01-01: 1 mg via INTRAVENOUS
  Filled 2024-01-01: qty 1

## 2024-01-01 MED ORDER — CLONIDINE HCL 0.1 MG PO TABS
0.1000 mg | ORAL_TABLET | Freq: Three times a day (TID) | ORAL | Status: DC
  Administered 2024-01-02 (×3): 0.1 mg via ORAL
  Filled 2024-01-01 (×4): qty 1

## 2024-01-01 MED ORDER — WARFARIN SODIUM 7.5 MG PO TABS: 7.5000 mg | ORAL_TABLET | Freq: Every day | ORAL | Status: DC

## 2024-01-01 NOTE — Care Management (Signed)
 ED RNCM consulted by [facility] RN patient is awaiting [facility] transport and concerned about pain and weakness in right arm and inability to use walker at home.  Patient requesting to speak with EDP prior to transport home, EDP notified,  updated ED nursing staff as well.    Michel Bickers RN, BSN CNOR ED RN Care Manager [phone]

## 2024-01-01 NOTE — ED Provider Notes (Addendum)
 Patient care was taken over from Dr. Audrie Lia.  Patient presented with left flank pain.  No nausea or vomiting.  No fevers per his report.  He was awaiting CT imaging.  This showed asymmetrical thickening of his bladder.  Differential includes cancer versus infection versus nondistended bladder.  He does not produce urine given his stage renal disease.  Will go ahead and start him on Keflex.  Discussed these findings with him and that he needs to follow-up with a urologist.  Will give him information about outpatient follow-up with urology.  He had some severe hydronephrosis of the right kidney/ureter but this appears to be chronic and unchanged from prior scans.  He does not have any pain on the right side.  His abdomen exam is benign.  He was discharged home in good condition.  Return precautions were given.  22:21 patient does not feel like he can take care of himself at home because he has worsening radicular pain from his neck and cannot use his walker.  This sounds to be an exacerbation of a chronic issue.  He had a prior neck fracture status post repair.  He has had pain for about a year.  He said he has gotten weaker since he started dialysis this past fall.  He has good strength in his arms as far as grip strength.  No significant numbness.  He has pain with movement of his arm.  He has not had any recent falls.  At this point do not feel that any emergent imaging is indicated.  Seems to be an exacerbation of a chronic problem.  TOC has been consulted for placement. Discussed with Dr. Valentino Nose with nephrology that patient will need dialysis while he is here.  His next scheduled day is tomorrow.   Rolan Bucco, MD 01/01/24 2012    Rolan Bucco, MD 01/01/24 2232

## 2024-01-01 NOTE — Discharge Instructions (Addendum)
 You have some inflammation of your bladder that needs to be further assessed by a urologist.  Please call to make an appointment.  Take the antibiotics as discussed.  Return to the emergency room if you have any worsening symptoms.  Continue your dialysis schedule Tuesday Thursday Saturday.  Return for any new or worse symptoms.

## 2024-01-01 NOTE — ED Notes (Addendum)
 Got patient into a gown on the monitor patient is resting with call bell in reach

## 2024-01-01 NOTE — ED Notes (Addendum)
 Pt is a poor historian of home medications. Endorses taking his cloNIDine and OXYCONTIN 30 mg last night. Recognizes the names of his other medications and he states he has taken them in the past week. Unsure of the dosage and frequency, RN is unsure of medication compliance at home. This RN asked pharmacy if they could do a home medication reconciliation.

## 2024-01-01 NOTE — ED Triage Notes (Signed)
 Patient BIB EMS from home for weakness starting last night. States "I can't use my right arm and my left kidney is bothering me".  Had dialysis yesterday.

## 2024-01-02 LAB — CBC
HCT: 29.4 % — ABNORMAL LOW (ref 39.0–52.0)
Hemoglobin: 9.4 g/dL — ABNORMAL LOW (ref 13.0–17.0)
MCH: 33.7 pg (ref 26.0–34.0)
MCHC: 32 g/dL (ref 30.0–36.0)
MCV: 105.4 fL — ABNORMAL HIGH (ref 80.0–100.0)
Platelets: 180 10*3/uL (ref 150–400)
RBC: 2.79 MIL/uL — ABNORMAL LOW (ref 4.22–5.81)
RDW: 17.6 % — ABNORMAL HIGH (ref 11.5–15.5)
WBC: 5.1 10*3/uL (ref 4.0–10.5)
nRBC: 0 % (ref 0.0–0.2)

## 2024-01-02 LAB — RENAL FUNCTION PANEL
Albumin: 3 g/dL — ABNORMAL LOW (ref 3.5–5.0)
Anion gap: 12 (ref 5–15)
BUN: 21 mg/dL (ref 8–23)
CO2: 29 mmol/L (ref 22–32)
Calcium: 8.1 mg/dL — ABNORMAL LOW (ref 8.9–10.3)
Chloride: 98 mmol/L (ref 98–111)
Creatinine, Ser: 7.34 mg/dL — ABNORMAL HIGH (ref 0.61–1.24)
GFR, Estimated: 7 mL/min — ABNORMAL LOW (ref >60)
Glucose, Bld: 95 mg/dL (ref 70–99)
Phosphorus: 5.4 mg/dL — ABNORMAL HIGH (ref 2.5–4.6)
Potassium: 4.4 mmol/L (ref 3.5–5.1)
Sodium: 139 mmol/L (ref 135–145)

## 2024-01-02 LAB — PROTIME-INR
INR: 1.1 (ref 0.8–1.2)
Prothrombin Time: 14.8 s (ref 11.4–15.2)

## 2024-01-02 LAB — HEPATITIS B SURFACE ANTIGEN: Hepatitis B Surface Ag: NONREACTIVE

## 2024-01-02 MED ORDER — PENTAFLUOROPROP-TETRAFLUOROETH EX AERO: 1.0000 | INHALATION_SPRAY | CUTANEOUS | Status: DC | PRN

## 2024-01-02 MED ORDER — SODIUM CHLORIDE 0.9 % IV SOLN
900.0000 mg | INTRAVENOUS | Status: DC
  Administered 2024-01-02: 900 mg via INTRAVENOUS
  Filled 2024-01-02: qty 18

## 2024-01-02 MED ORDER — DAPTOMYCIN-SODIUM CHLORIDE 700-0.9 MG/100ML-% IV SOLN: 700.0000 mg | INTRAVENOUS | Status: DC

## 2024-01-02 MED ORDER — LIDOCAINE HCL (PF) 1 % IJ SOLN: 5.0000 mL | INTRAMUSCULAR | Status: DC | PRN

## 2024-01-02 MED ORDER — DAPTOMYCIN-SODIUM CHLORIDE 700-0.9 MG/100ML-% IV SOLN
700.0000 mg | INTRAVENOUS | Status: AC
  Administered 2024-01-05: 700 mg via INTRAVENOUS
  Filled 2024-01-02: qty 100

## 2024-01-02 MED ORDER — WARFARIN SODIUM 10 MG PO TABS
10.0000 mg | ORAL_TABLET | Freq: Once | ORAL | Status: AC
  Administered 2024-01-02: 10 mg via ORAL
  Filled 2024-01-02: qty 1

## 2024-01-02 MED ORDER — ANTICOAGULANT SODIUM CITRATE 4% (200MG/5ML) IV SOLN: 5.0000 mL | Status: DC | PRN

## 2024-01-02 MED ORDER — LIDOCAINE-PRILOCAINE 2.5-2.5 % EX CREA: 1.0000 | TOPICAL_CREAM | CUTANEOUS | Status: DC | PRN

## 2024-01-02 MED ORDER — WARFARIN - PHARMACIST DOSING INPATIENT: Freq: Every day | Status: DC

## 2024-01-02 MED ORDER — SODIUM CHLORIDE 0.9 % IV SOLN
1.0000 g | INTRAVENOUS | Status: DC
  Administered 2024-01-02 – 2024-01-05 (×2): 1 g via INTRAVENOUS
  Filled 2024-01-02 (×3): qty 1

## 2024-01-02 MED ORDER — HEPARIN SODIUM (PORCINE) 1000 UNIT/ML IJ SOLN
3200.0000 [IU] | Freq: Once | INTRAMUSCULAR | Status: AC
  Administered 2024-01-02: 3200 [IU]
  Filled 2024-01-02: qty 4

## 2024-01-02 MED ORDER — CHLORHEXIDINE GLUCONATE CLOTH 2 % EX PADS: 6.0000 | MEDICATED_PAD | Freq: Every day | CUTANEOUS | Status: DC

## 2024-01-02 MED ORDER — HEPARIN SODIUM (PORCINE) 1000 UNIT/ML DIALYSIS: 1000.0000 [IU] | INTRAMUSCULAR | Status: DC | PRN

## 2024-01-02 MED ORDER — ALTEPLASE 2 MG IJ SOLR: 2.0000 mg | Freq: Once | INTRAMUSCULAR | Status: DC | PRN

## 2024-01-02 NOTE — Progress Notes (Signed)
 Received patient in bed to unit.  Alert and oriented.  Informed consent signed and in chart.   TX duration:3.5 hours  Patient tolerated well.  Transported back to the room  Alert, without acute distress.  Hand-off given to patient's nurse.   Access used: Right internal jugular HD Cath Access issues: none  Total UF removed: 2L Medication(s) given: Enriqueta Shutter   01/02/24 1837  Vitals  Temp 98.1 F (36.7 C)  BP 125/84  Pulse Rate 61  Resp 11  Oxygen Therapy  SpO2 100 %  O2 Device Room Air  Patient Activity (if Appropriate) In bed  Pulse Oximetry Type Continuous  During Treatment Monitoring  Duration of HD Treatment -hour(s) 3.5 hour(s)  HD Safety Checks Performed Yes  Intra-Hemodialysis Comments Tx initiated  Dialysis Fluid Bolus Normal Saline  Bolus Amount (mL) 300 mL  Post Treatment  Dialyzer Clearance Clear  Liters Processed 84  Fluid Removed (mL) 2000 mL  Tolerated HD Treatment Yes  Hemodialysis Catheter Right Internal jugular  Placement Date/Time: 01/02/24 1459   Placed prior to admission: Yes  Orientation: Right  Access Location: Internal jugular  Site Condition No complications  Blue Lumen Status Flushed;Heparin locked;Dead end cap in place  Red Lumen Status Flushed;Heparin locked;Dead end cap in place  Purple Lumen Status N/A  Catheter fill solution Heparin 1000 units/ml  Catheter fill volume (Arterial) 1.6 cc  Catheter fill volume (Venous) 1.6  Dressing Type Transparent  Dressing Status Antimicrobial disc/dressing in place;Clean, Dry, Intact  Interventions Other (Comment) (deaccessed)  Drainage Description None  Dressing Change Due 01/09/24  Post treatment catheter status Capped and Clamped     Stacie Glaze LPN Kidney Dialysis Unit

## 2024-01-02 NOTE — Progress Notes (Signed)
 PHARMACY - ANTICOAGULATION CONSULT NOTE  Pharmacy Consult for PTA warfarin Indication: history of pulmonary embolus  Patient Measurements:   Vital Signs: Temp: 97.7 F (36.5 C) (03/14 2309) Temp Source: Oral (03/14 2309) BP: 186/94 (03/15 0030) Pulse Rate: 94 (03/15 0030)  Labs: Recent Labs    01/01/24 1615 01/01/24 1930 01/02/24 0045  HGB  --  8.5*  --   HCT  --  26.4*  --   PLT  --  176  --   LABPROT  --   --  14.8  INR  --   --  1.1  CREATININE 5.88*  --   --     CrCl cannot be calculated (Unknown ideal weight.).  Medications:  -Warfarin 5mg  PO daily (Last dose unknown ~10 days ago, stated has stopped given high INR ~10 days ago); dose based on [facility] note from 2/18  Assessment: 52 yoM presented to ED with left flank pain with CT showing asymmetrical thickening of bladder. Pharmacy consulted to dose PTA warfarin for history of PE.  -INR 1.1 -Hgb 8.5, plts 176 -History of Xarelto/eliquis and switched to warfarin for possible eliquis failure (2023) given DVT propagation  Goal of Therapy:  INR 2-3 Monitor platelets by anticoagulation protocol: Yes   Plan:  -Warfarin 10mg  PO x1 -Daily INR/CBC  Arabella Merles, PharmD. Clinical Pharmacist 01/02/2024 2:01 AM

## 2024-01-02 NOTE — Progress Notes (Signed)
 Pharmacy Antibiotic Note  Thomas [name] is a 73 y.o. male for which pharmacy has been consulted for daptomycin and ceftazidime  dosing for  osteomyelitis .  Pt was on cefepime and daptomycin for osteomyelitis that was started at [facility]. RN noted worsening mental status since starting abx.  Cefepime dose pta is 2000mg  QHD.  Daptomycin dose pta is 700mg  q T/Th and 850 mg every Saturday.  Originally EOT planned for 01/09/24  Patient with a history of ESRD on HD TTS, DM, HTN, R BKA. Patient presenting with left flank pain.  Last HD 3/13 WBC 5.1; T 97.6; HR 51; RR 18  Plan: Nephro planning for HD today (3/15) Ceftazidime 1g TTS Continue Daptomycin dose pta is 700mg  q T/Th and 850 mg (will round to 900mg ) every Saturday Monitor WBC, fever, renal function, cultures De-escalate when able F/u ID recommendations F/u Nephrology plan     Temp (24hrs), Avg:98 F (36.7 C), Min:97.6 F (36.4 C), Max:98.3 F (36.8 C)  Recent Labs  Lab 01/01/24 1615 01/01/24 1930  WBC  --  5.1  CREATININE 5.88*  --     CrCl cannot be calculated (Unknown ideal weight.).    Allergies  Allergen Reactions   Other Other (See Comments)    Blood pressure issues Per [facility] hospital: Pt states he can only take these pain meds or else he gets very sick: Oxycontin,morphine,demerol, and dilaudid are the only pain meds pt states he can take.     Oxymorphone Other (See Comments)    Causes kidney problems   Amitriptyline Other (See Comments)   Aspirin Nausea And Vomiting    Gi upset, has kidney issues   Beta Vulgaris Nausea And Vomiting   Buspirone Other (See Comments)    Affected his head    Cabbage Nausea And Vomiting   Codeine Other (See Comments)    Pt does not remember the reaction but knows he can not take    Cyclobenzaprine Nausea And Vomiting   Diflunisal Nausea And Vomiting and Other (See Comments)   Fish Allergy Nausea And Vomiting   Fish-Derived Products Nausea And Vomiting    Hydroxyzine Nausea And Vomiting   Levetiracetam Other (See Comments)   Methadone Other (See Comments)    Made him loopy   Nalbuphine Other (See Comments)    Cutaneous eruption (morphologic abnormality)   Pentazocine Other (See Comments)    Pt does not remember the reaction    Propoxyphene Other (See Comments)    Pt does not remember the reaction     Shellfish Allergy Nausea And Vomiting   Sulfa Antibiotics Hives   Sulfasalazine Other (See Comments)    Pt does not remember the reaction   Amoxicillin Nausea And Vomiting and Rash    Per [facility]   Microbiology results: Pending  Thank you for allowing pharmacy to be a part of this patient's care.  Delmar Landau, PharmD, BCPS 01/02/2024 11:14 AM ED Clinical Pharmacist -  [phone]

## 2024-01-02 NOTE — Consult Note (Signed)
 [facility] Renal Consultation Note    Indication for Consultation:  Management of ESRD/hemodialysis, anemia, hypertension/volume, and secondary hyperparathyroidism.  HPI: Thomas [name] is a 73 y.o. male with PMH including ESRD on dialysis, DM, HTN, R BKA who presented to the ED with left flank pain. CT showed asymmetrical thickening of his bladder and severe hydronephrosis of the right kidney/ureter that was unchanged from previous exams.  He was empirically started on keflex with plans for outpatient urology follow up, however patient reported he did not feel he could take care of himself at home due to worsening neck pain and weakness. TOC was consulted for placement. Nephrology was consulted for routine dialysis.  Labs notable for K+ 4.3, BUN 16, Cr 5.88, WBC 5.1, Hgb 8.5, plt 176. On exam, patient reports neck pain radiating down R arm. Denies abdominal/flank pain at present. Denies SOB, CP, dizziness, HA, orthopnea, nausea, vomiting, diarrhea, edema. Outside HD records reviewed, did attend HD on 12/29/23 but asked to go to HD for "pain all over, " not sure which ED he went to. He did attend outpatient HD on 12/31/23. Notes indicate he had a COVID infection in February and is currently on cefepime and daptomycin for osteomyelitis, started at [facility]. He is a somewhat poor historian and requires frequent prompting to answer questions. Talked with RN at HD center and she reports they noted worsening altered mental status lately after giving him his antibiotics.    Past Medical History:  Diagnosis Date   Arthritis    BPH (benign prostatic hyperplasia) 11/24/2019   Chronic pain syndrome 11/24/2019   CKD (chronic kidney disease), stage IV (HCC) 11/24/2019   Diabetes mellitus without complication (HCC)    Gout    Hypertension    Muscle pain    Physical deconditioning 11/24/2019   Scars    Swelling    Past Surgical History:  Procedure Laterality Date   BELOW KNEE LEG AMPUTATION  Right    ESOPHAGOGASTRODUODENOSCOPY (EGD) WITH PROPOFOL N/A 11/25/2019   Procedure: ESOPHAGOGASTRODUODENOSCOPY (EGD) WITH PROPOFOL;  Surgeon: Jeani Hawking, MD;  Location: [facility]COPY;  Service: Endoscopy;  Laterality: N/A;   IR FLUORO GUIDE CV LINE RIGHT  09/05/2019   IR LUMBAR DISC ASPIRATION W/IMG GUIDE  11/26/2019   IR US GUIDE VASC ACCESS RIGHT  09/05/2019   RADIOLOGY WITH ANESTHESIA N/A 11/25/2019   Procedure: Thoracic and Lumbar MRI WITH ANESTHESIA;  Surgeon: Radiologist, Medication, MD;  Location: [facility] OR;  Service: Radiology;  Laterality: N/A;   Family History  Problem Relation Age of Onset   Heart disease Mother    Cancer Father    Social History:  reports that he has never smoked. He has never been exposed to tobacco smoke. He has never used smokeless tobacco. He reports that he does not drink alcohol and does not use drugs.  ROS: As per HPI otherwise negative.  Physical Exam: Vitals:   01/02/24 0600 01/02/24 0615 01/02/24 0622 01/02/24 0750  BP: 96/63   124/74  Pulse:  61 (!) 53 (!) 51  Resp: 12  10 18   Temp:    97.6 F (36.4 C)  TempSrc:      SpO2:  90% 99% 91%     General: sleeping male, awakens to voice, NAD Head: Normocephalic, atraumatic, sclera non-icteric, mucus membranes are moist. Lungs: Clear bilaterally to auscultation without wheezes, rales, or rhonchi. Breathing is unlabored on RA. Heart: RRR with normal S1, S2. No murmurs, rubs, or gallops appreciated. Abdomen: Soft, non-tender,  with  normoactive bowel sounds. No obvious abdominal masses. Musculoskeletal:   Lower extremities: No edema or ischemic changes, no open wounds. Neuro: Alert and oriented X 3. Moves all extremities spontaneously. Psych:  Responds to questions appropriately with a normal affect. Dialysis Access: R BKA, trace woody edema L lower extremities  Allergies  Allergen Reactions   Other Other (See Comments)    Blood pressure issues Per [facility] hospital: Pt states he can only take these  pain meds or else he gets very sick: Oxycontin,morphine,demerol, and dilaudid are the only pain meds pt states he can take.     Oxymorphone Other (See Comments)    Causes kidney problems   Amitriptyline Other (See Comments)   Aspirin Nausea And Vomiting    Gi upset, has kidney issues   Beta Vulgaris Nausea And Vomiting   Buspirone Other (See Comments)    Affected his head    Cabbage Nausea And Vomiting   Codeine Other (See Comments)    Pt does not remember the reaction but knows he can not take    Cyclobenzaprine Nausea And Vomiting   Diflunisal Nausea And Vomiting and Other (See Comments)   Fish Allergy Nausea And Vomiting   Fish-Derived Products Nausea And Vomiting   Hydroxyzine Nausea And Vomiting   Levetiracetam Other (See Comments)   Methadone Other (See Comments)    Made him loopy   Nalbuphine Other (See Comments)    Cutaneous eruption (morphologic abnormality)   Pentazocine Other (See Comments)    Pt does not remember the reaction    Propoxyphene Other (See Comments)    Pt does not remember the reaction     Shellfish Allergy Nausea And Vomiting   Sulfa Antibiotics Hives   Sulfasalazine Other (See Comments)    Pt does not remember the reaction   Amoxicillin Nausea And Vomiting and Rash    Per [facility]   Prior to Admission medications   Medication Sig Start Date End Date Taking? Authorizing Provider  acetaminophen (TYLENOL) 650 MG CR tablet Take 1,300 mg by mouth every 8 (eight) hours as needed for pain.   Yes [provider]  amLODipine (NORVASC) 5 MG tablet Take 5 mg by mouth daily.   Yes [provider]  atorvastatin (LIPITOR) 80 MG tablet Take 80 mg by mouth daily.   Yes [provider]  bethanechol (URECHOLINE) 10 MG tablet Take 10 mg by mouth 2 (two) times daily. 12/21/23  Yes [provider]  cephALEXin (KEFLEX) 500 MG capsule Take 1 capsule (500 mg total) by mouth 2 (two) times daily. 01/01/24  Yes Rolan Bucco, MD   Cholecalciferol 125 MCG (5000 UT) capsule Take 5,000 Units by mouth daily.   Yes [provider]  cloNIDine (CATAPRES) 0.1 MG tablet Take 0.1 mg by mouth 3 (three) times daily.   Yes [provider]  cyanocobalamin (VITAMIN B12) 1000 MCG tablet Take 1 tablet by mouth daily.   Yes [provider]  furosemide (LASIX) 40 MG tablet Take 40 mg by mouth daily. 11/26/23  Yes [provider]  hydrALAZINE (APRESOLINE) 25 MG tablet Take 25 mg by mouth 3 (three) times daily.   Yes [provider]  levothyroxine (SYNTHROID) 50 MCG tablet Take 50 mcg by mouth daily before breakfast.    Yes [provider]  losartan (COZAAR) 50 MG tablet Take 50 mg by mouth at bedtime. 11/11/23  Yes [provider]  magnesium oxide (MAG-OX) 400 MG tablet Take 800 mg by mouth in the morning,  at noon, and at bedtime.   Yes [provider]  metoprolol tartrate (LOPRESSOR) 50 MG tablet Take 1 tablet (50 mg total) by mouth 2 (two) times daily. 12/24/21  Yes Rhetta Mura, MD  OXYCONTIN 30 MG 12 hr tablet Take 30 mg by mouth 3 (three) times daily as needed (pain). 12/30/21  Yes [provider]  senna-docusate (SENOKOT-S) 8.6-50 MG tablet Take 4 tablets by mouth 2 (two) times daily. 11/04/22  Yes [provider]  sevelamer (RENAGEL) 800 MG tablet Take 1,600 mg by mouth 3 (three) times daily. 08/27/23  Yes [provider]  tamsulosin (FLOMAX) 0.4 MG CAPS capsule Take 1 capsule (0.4 mg total) by mouth daily. 09/25/20  Yes Verdene Lennert, MD  tiZANidine (ZANAFLEX) 4 MG tablet Take 4 mg by mouth every 8 (eight) hours as needed for muscle spasms. 11/26/21  Yes [provider]  Vitamin E 670 MG (1000 UT) CAPS Take 1,000 mg by mouth daily.   Yes [provider]  pantoprazole (PROTONIX) 40 MG tablet Take 1 tablet by mouth 2 times daily. Patient not taking: Reported on 01/02/2024 10/01/21 01/01/25  Darlin Drop, DO  warfarin  (COUMADIN) 5 MG tablet Take 0.5 tablets (2.5 mg total) by mouth daily. Patient not taking: Reported on 01/02/2024 01/03/22   Rhetta Mura, MD   Current Facility-Administered Medications  Medication Dose Route Frequency Provider Last Rate Last Admin   amLODipine (NORVASC) tablet 5 mg  5 mg Oral Daily Rolan Bucco, MD       atorvastatin (LIPITOR) tablet 80 mg  80 mg Oral Daily Rolan Bucco, MD       calcitRIOL (ROCALTROL) capsule 0.25 mcg  0.25 mcg Oral Daily Rolan Bucco, MD       cloNIDine (CATAPRES) tablet 0.1 mg  0.1 mg Oral TID Rolan Bucco, MD   0.1 mg at 01/02/24 0031   finasteride (PROSCAR) tablet 5 mg  5 mg Oral Daily Rolan Bucco, MD       hydrALAZINE (APRESOLINE) tablet 100 mg  100 mg Oral Q8H Belfi, Melanie, MD   100 mg at 01/02/24 0031   levothyroxine (SYNTHROID) tablet 50 mcg  50 mcg Oral Q0600 Rolan Bucco, MD   50 mcg at 01/02/24 0538   oxyCODONE (OXYCONTIN) 12 hr tablet 30 mg  30 mg Oral Q12H Rolan Bucco, MD   30 mg at 01/02/24 0031   tamsulosin (FLOMAX) capsule 0.4 mg  0.4 mg Oral Daily Rolan Bucco, MD       Warfarin - Pharmacist Dosing Inpatient   Does not apply q1600 Arabella Merles, [facility]       Current Outpatient Medications  Medication Sig Dispense Refill   acetaminophen (TYLENOL) 650 MG CR tablet Take 1,300 mg by mouth every 8 (eight) hours as needed for pain.     amLODipine (NORVASC) 5 MG tablet Take 5 mg by mouth daily.     atorvastatin (LIPITOR) 80 MG tablet Take 80 mg by mouth daily.     bethanechol (URECHOLINE) 10 MG tablet Take 10 mg by mouth 2 (two) times daily.     cephALEXin (KEFLEX) 500 MG capsule Take 1 capsule (500 mg total) by mouth 2 (two) times daily. 14 capsule 0   Cholecalciferol 125 MCG (5000 UT) capsule Take 5,000 Units by mouth daily.     cloNIDine (CATAPRES) 0.1 MG tablet Take 0.1 mg by mouth 3 (three) times daily.     cyanocobalamin (VITAMIN B12) 1000 MCG tablet Take 1 tablet by mouth daily.  furosemide (LASIX) 40 MG tablet  Take 40 mg by mouth daily.     hydrALAZINE (APRESOLINE) 25 MG tablet Take 25 mg by mouth 3 (three) times daily.     levothyroxine (SYNTHROID) 50 MCG tablet Take 50 mcg by mouth daily before breakfast.      losartan (COZAAR) 50 MG tablet Take 50 mg by mouth at bedtime.     magnesium oxide (MAG-OX) 400 MG tablet Take 800 mg by mouth in the morning, at noon, and at bedtime.     metoprolol tartrate (LOPRESSOR) 50 MG tablet Take 1 tablet (50 mg total) by mouth 2 (two) times daily. 60 tablet 1   OXYCONTIN 30 MG 12 hr tablet Take 30 mg by mouth 3 (three) times daily as needed (pain).     senna-docusate (SENOKOT-S) 8.6-50 MG tablet Take 4 tablets by mouth 2 (two) times daily.     sevelamer (RENAGEL) 800 MG tablet Take 1,600 mg by mouth 3 (three) times daily.     tamsulosin (FLOMAX) 0.4 MG CAPS capsule Take 1 capsule (0.4 mg total) by mouth daily. 30 capsule 0   tiZANidine (ZANAFLEX) 4 MG tablet Take 4 mg by mouth every 8 (eight) hours as needed for muscle spasms.     Vitamin E 670 MG (1000 UT) CAPS Take 1,000 mg by mouth daily.     pantoprazole (PROTONIX) 40 MG tablet Take 1 tablet by mouth 2 times daily. (Patient not taking: Reported on 01/02/2024) 60 tablet 0   warfarin (COUMADIN) 5 MG tablet Take 0.5 tablets (2.5 mg total) by mouth daily. (Patient not taking: Reported on 01/02/2024)     Labs: Basic Metabolic Panel: Recent Labs  Lab 01/01/24 1615  NA 136  K 4.3  CL 96*  CO2 29  GLUCOSE 79  BUN 16  CREATININE 5.88*  CALCIUM 8.2*   Liver Function Tests: Recent Labs  Lab 01/01/24 1615  AST 21  ALT 8  ALKPHOS 52  BILITOT 0.6  PROT 8.0  ALBUMIN 3.1*   No results for input(s): "LIPASE", "AMYLASE" in the last 168 hours. No results for input(s): "AMMONIA" in the last 168 hours. CBC: Recent Labs  Lab 01/01/24 1930  WBC 5.1  NEUTROABS 3.0  HGB 8.5*  HCT 26.4*  MCV 103.5*  PLT 176   Cardiac Enzymes: No results for input(s): "CKTOTAL", "CKMB", "CKMBINDEX", "TROPONINI" in the last  168 hours. CBG: No results for input(s): "GLUCAP" in the last 168 hours. Iron Studies: No results for input(s): "IRON", "TIBC", "TRANSFERRIN", "FERRITIN" in the last 72 hours. Studies/Results: CT Renal Stone Study Result Date: 01/01/2024 CLINICAL DATA:  Abdominal/flank pain, stone suspected EXAM: CT ABDOMEN AND PELVIS WITHOUT CONTRAST TECHNIQUE: Multidetector CT imaging of the abdomen and pelvis was performed following the standard protocol without IV contrast. RADIATION DOSE REDUCTION: This exam was performed according to the departmental dose-optimization program which includes automated exposure control, adjustment of the mA and/or kV according to patient size and/or use of iterative reconstruction technique. COMPARISON:  CT abdomen pelvis 12/30/2021 and renal ultrasound 01/14/2022 FINDINGS: Lower chest: Chronic scarring and bronchiolectasis in the lung bases. Hepatobiliary: Unremarkable noncontrast appearance of the liver, gallbladder, and biliary tree. Pancreas: Unremarkable. Spleen: Unremarkable. Adrenals/Urinary Tract: Normal adrenal glands. Severe right hydroureteronephrosis with associated parenchymal thinning is unchanged from 11/01/2023. Transition point in the distal right ureter is also unchanged (series 3/image 69). No definite mass or urothelial thickening noting limitations of noncontrast exam. Asymmetric wall thickening about the anterior bladder (circa series 3/image 78). Stomach/Bowel: Normal caliber large  and small bowel. No bowel wall thickening. Stomach is within normal limits. Normal appendix. Vascular/Lymphatic: IVC filter. Aortic atherosclerotic calcification. No lymphadenopathy. Reproductive: Unremarkable. Other: No free intraperitoneal fluid or air. Musculoskeletal: Multilevel spinous process and facet joint effusion. Thoracic and lumbar surgical hardware. Multilevel severe degenerative changes greatest at T9-T12. 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 IMPRESSION: 1. Asymmetric  wall thickening about the anterior bladder. Differential considerations include under distension, cystitis, or neoplasm. Recommend correlation with urinalysis and cystoscopy. 2. Severe right hydroureteronephrosis with associated parenchymal thinning is unchanged from 11/01/2023. Transition point in the distal right ureter is also unchanged. No ureteral stone. No definite mass or urothelial thickening noting limitations of noncontrast exam. 3. Aortic Atherosclerosis (ICD10-I70.0). Electronically Signed   By: Minerva Fester M.D.   On: 01/01/2024 19:11    Dialysis Orders:  Center: [place]  on TTS . 180NRe TTS 4 hr 15 min BFRO 400 DFR Auto 1.5 EDW 92kg 2K 2.5Ca TDC No heparin Mircera IV q 2 weeks- last dose Calcitriol 0.67mcg PO q HD Cefepime 2000mg  IV q HD- stop date 01/09/24 Daptomycin 700mg  IV q T/Th, 850mg  q Saturday. Stop date 01/09/24  Assessment/Plan:  Flank pain: Initially presented with flank pain, CT showed asymmetrical bladder thickening and chronic hydronephrosis. Plan for outpt urology follow up.   ESRD:  On TTS schedule, has not missed dialysis lately. Will arrange for HD today, continue TTS schedule Recent osteomyelitis: On cefepime and daptopmycin through 01/09/24 but RN reports they noticed worsening AMS after giving antibiotics lately. Cefepime can cause encephalopathy. Will try to get records from that hospitalization and also consult pharmacy  Hypertension/volume: Respiratory status stable, some edema on exam. BP stable. UFG 2L as tolerated.    Anemia: Hgb 8.5, mircera last given 12/29/23, not due for next dose yet  Metabolic bone disease: Corrected calcium at goal, will check phos. Continue renvela  Nutrition:  Will need renal diet/fluid restrictions Dispo: Pt reports debility and difficulty caring for himself at home. Recent ED visits for AMS and pain. Appears plan is for SNF   Rogers Blocker, PA-C 01/02/2024, 9:39 AM  [facility] Pager: [phone]

## 2024-01-02 NOTE — [place] FL2 (Signed)
 [place] MEDICAID FL2 LEVEL OF CARE FORM     IDENTIFICATION  Patient Name: Thomas [name] Birthdate: September 30, 1951 Sex: male Admission Date (Current Location): 01/01/2024  [facility] and [place] Number:  Producer, television/film/video and Address:  [facility], [address], [place], [place] [address]      Provider Number: [id number]  Attending Physician Name and Address:  System, Provider Not In  Relative Name and Phone Number:       Current Level of Care: Hospital Recommended Level of Care: Skilled Nursing Facility Prior Approval Number:    Date Approved/Denied:   PASRR Number: [id number]  Discharge Plan: SNF    Current Diagnoses: Patient Active Problem List   Diagnosis Date Noted   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   Open wound of left heel 01/14/2022   Acute renal failure superimposed on stage 4 chronic kidney disease (HCC) 01/13/2022   Chest pain 01/13/2022   UTI (urinary tract infection) 12/31/2021   Chronic anticoagulation 12/31/2021   Prolonged QT interval 12/31/2021   Multifocal pneumonia 12/30/2021   SVT (supraventricular tachycardia) (HCC) 12/11/2021   Obesity, Class I, BMI 30-34.9 12/10/2021   Left leg cellulitis 12/07/2021   Hyperkalemia 10/09/2021   Hypothyroidism 10/09/2021   Hydronephrosis of right kidney 10/09/2021   Hypoglycemia 10/07/2021   Deep vein thrombosis (DVT) of left lower extremity (HCC) 10/07/2021   Acute renal failure superimposed on chronic kidney disease (HCC) 09/17/2021   CAP (community acquired pneumonia) 09/17/2021   Cellulitis of left lower extremity    Pressure injury of skin 06/03/2021   AKI (acute kidney injury) (HCC) 06/02/2021   Pain management    Cord compression (HCC)    Drug-induced constipation    Acute midline low back pain with sciatica    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) 08/30/2020   Urinary retention 12/04/2019   BPH (benign prostatic hyperplasia) 11/24/2019   CKD (chronic kidney  disease), stage IV (HCC) 11/24/2019   Chronic pain syndrome 11/24/2019   Physical deconditioning 11/24/2019   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   Pulmonary emboli (HCC) 09/29/2018   [name], 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/2018   Type 2 diabetes mellitus with stage 4 chronic kidney disease, without long-term current use of insulin (HCC) 09/29/2018   Gout 09/29/2018   Essential hypertension 09/29/2018   Chronic combined systolic and diastolic congestive heart failure (HCC) 09/29/2018   GERD (gastroesophageal reflux disease) 09/29/2018    Orientation RESPIRATION BLADDER Height & Weight     Self, Situation, Place, Time  Normal Continent Weight: 274 lbs  Height:  6'5   BEHAVIORAL SYMPTOMS/MOOD NEUROLOGICAL BOWEL NUTRITION STATUS      Continent Diet (Renal/carb modified)  AMBULATORY STATUS COMMUNICATION OF NEEDS Skin   Extensive Assist Verbally Normal                       Personal Care Assistance Level of Assistance  Bathing, Feeding, Dressing Bathing Assistance: Maximum assistance Feeding assistance: Independent Dressing Assistance: Maximum assistance     Functional Limitations Info  Sight, Hearing, Speech Sight Info: Adequate Hearing Info: Adequate Speech Info: Adequate    SPECIAL CARE FACTORS FREQUENCY                       Contractures Contractures Info: Not present    Additional Factors Info  Code Status, Allergies Code Status Info: Full Allergies Info: Oxymorphone, Aspirin, Beta Vulgaris, Buspirone, Cabbage, Codeine, Dolobid (Diflunisal), Fish Allergy, Fish-derived  Products, Methadone, Pentazocine, Propoxyphene, Shellfish Allergy, Sulfa Antibiotics, Sulfasalazine, Vistaril (Hydroxyzine), Amoxicillin, Levetiracetam, Cyclobenzaprine, Nalbuphine, Amitriptyline           Current Medications (01/02/2024):  This is the current hospital active medication list Current Facility-Administered Medications  Medication Dose Route Frequency Provider Last  Rate Last Admin   alteplase (CATHFLO ACTIVASE) injection 2 mg  2 mg Intracatheter Once PRN Thomasena Edis, Samantha G, PA-C       amLODipine (NORVASC) tablet 5 mg  5 mg Oral Daily Rolan Bucco, MD   5 mg at 01/02/24 1350   anticoagulant sodium citrate solution 5 mL  5 mL Intracatheter PRN Collins, Samantha G, PA-C       atorvastatin (LIPITOR) tablet 80 mg  80 mg Oral Daily Rolan Bucco, MD   80 mg at 01/02/24 1350   calcitRIOL (ROCALTROL) capsule 0.25 mcg  0.25 mcg Oral Daily Rolan Bucco, MD   0.25 mcg at 01/02/24 1350   cefTAZidime (FORTAZ) 1 g in sodium chloride 0.9 % 100 mL IVPB  1 g Intravenous Q T,Th,Sa-HD Collins, Samantha G, PA-C       Chlorhexidine Gluconate Cloth 2 % PADS 6 each  6 each Topical Q0600 Oretha Milch, PA-C       cloNIDine (CATAPRES) tablet 0.1 mg  0.1 mg Oral TID Rolan Bucco, MD   0.1 mg at 01/02/24 1350   DAPTOmycin (CUBICIN) 900 mg in sodium chloride 0.9 % IVPB  900 mg Intravenous Q Sat-HD Collins, Samantha G, PA-C       [START ON 01/05/2024] DAPTOmycin (CUBICIN) IVPB 700 mg/110mL premix  700 mg Intravenous Q Tue-HD Collins, Samantha G, PA-C       [START ON 01/07/2024] DAPTOmycin (CUBICIN) IVPB 700 mg/173mL premix  700 mg Intravenous Q Thu-HD Collins, Samantha G, PA-C       finasteride (PROSCAR) tablet 5 mg  5 mg Oral Daily Rolan Bucco, MD       heparin injection 1,000 Units  1,000 Units Intracatheter PRN Collins, Samantha G, PA-C       hydrALAZINE (APRESOLINE) tablet 100 mg  100 mg Oral Q8H Rolan Bucco, MD   100 mg at 01/02/24 1350   levothyroxine (SYNTHROID) tablet 50 mcg  50 mcg Oral Q0600 Rolan Bucco, MD   50 mcg at 01/02/24 0538   lidocaine (PF) (XYLOCAINE) 1 % injection 5 mL  5 mL Intradermal PRN Oretha Milch, PA-C       lidocaine-prilocaine (EMLA) cream 1 Application  1 Application Topical PRN Oretha Milch, PA-C       oxyCODONE (OXYCONTIN) 12 hr tablet 30 mg  30 mg Oral Q12H Belfi, Shawna Orleans, MD   30 mg at 01/02/24 1350    pentafluoroprop-tetrafluoroeth (GEBAUERS) aerosol 1 Application  1 Application Topical PRN Oretha Milch, PA-C       tamsulosin (FLOMAX) capsule 0.4 mg  0.4 mg Oral Daily Rolan Bucco, MD   0.4 mg at 01/02/24 1350   Warfarin - Pharmacist Dosing Inpatient   Does not apply q1600 Arabella Merles, [facility]       Current Outpatient Medications  Medication Sig Dispense Refill   acetaminophen (TYLENOL) 650 MG CR tablet Take 1,300 mg by mouth every 8 (eight) hours as needed for pain.     amLODipine (NORVASC) 5 MG tablet Take 5 mg by mouth daily.     atorvastatin (LIPITOR) 80 MG tablet Take 80 mg by mouth daily.     bethanechol (URECHOLINE) 10 MG tablet Take 10 mg by mouth 2 (two) times  daily.     cephALEXin (KEFLEX) 500 MG capsule Take 1 capsule (500 mg total) by mouth 2 (two) times daily. 14 capsule 0   Cholecalciferol 125 MCG (5000 UT) capsule Take 5,000 Units by mouth daily.     cloNIDine (CATAPRES) 0.1 MG tablet Take 0.1 mg by mouth 3 (three) times daily.     cyanocobalamin (VITAMIN B12) 1000 MCG tablet Take 1 tablet by mouth daily.     furosemide (LASIX) 40 MG tablet Take 40 mg by mouth daily.     hydrALAZINE (APRESOLINE) 25 MG tablet Take 25 mg by mouth 3 (three) times daily.     levothyroxine (SYNTHROID) 50 MCG tablet Take 50 mcg by mouth daily before breakfast.      losartan (COZAAR) 50 MG tablet Take 50 mg by mouth at bedtime.     magnesium oxide (MAG-OX) 400 MG tablet Take 800 mg by mouth in the morning, at noon, and at bedtime.     metoprolol tartrate (LOPRESSOR) 50 MG tablet Take 1 tablet (50 mg total) by mouth 2 (two) times daily. 60 tablet 1   OXYCONTIN 30 MG 12 hr tablet Take 30 mg by mouth 3 (three) times daily as needed (pain).     senna-docusate (SENOKOT-S) 8.6-50 MG tablet Take 4 tablets by mouth 2 (two) times daily.     sevelamer (RENAGEL) 800 MG tablet Take 1,600 mg by mouth 3 (three) times daily.     tamsulosin (FLOMAX) 0.4 MG CAPS capsule Take 1 capsule (0.4 mg total) by mouth  daily. 30 capsule 0   tiZANidine (ZANAFLEX) 4 MG tablet Take 4 mg by mouth every 8 (eight) hours as needed for muscle spasms.     Vitamin E 670 MG (1000 UT) CAPS Take 1,000 mg by mouth daily.     pantoprazole (PROTONIX) 40 MG tablet Take 1 tablet by mouth 2 times daily. (Patient not taking: Reported on 01/02/2024) 60 tablet 0   warfarin (COUMADIN) 5 MG tablet Take 0.5 tablets (2.5 mg total) by mouth daily. (Patient not taking: Reported on 01/02/2024)       Discharge Medications: Please see discharge summary for a list of discharge medications.  Relevant Imaging Results:  Relevant Lab Results:   Additional Information SSN: [id number]. Patient has right BKA and a prosthesis. Patient goes to [facility], Arrival time: 9:55 AM, Chair Time: 10:00 AM  Susa Simmonds, LCSWA

## 2024-01-02 NOTE — TOC Initial Note (Signed)
 Transition of Care [facility]) - Initial/Assessment Note    Patient Details  Name: Thomas [name] MRN: [id number] Date of Birth: [birth year]  Transition [facility]) CM/SW Contact:    Susa Simmonds, LCSWA Phone Number: 01/02/2024, 1:44 PM  Clinical Narrative:   CSW spoke with patient who stated he has concerns with walking with his walker due to pain. The patiuent is agreebale for SNF palcement if its recommended by physuical therapy. The patient stated he has been to several SNF facilities over the years. The patient is a right BKA. CSW contacted [employer] in [place] and confirmed that patient is seen on Tuesday, Thursday, and Friday. The patient's arrival time is 9:55 AM with a chair time of 10:00 AM. The nurse told CSW that patient has been having difficulty lately managing on his own and SNF would be best for him. CSW was told that [name], [name], and [facility] can transport patient to their dialysis center if he is approved.    Expected Discharge Plan: Skilled Nursing Facility Barriers to Discharge: Continued Medical Work up   Patient Goals and CMS Choice Patient states their goals for this hospitalization and ongoing recovery are:: SNF          Expected Discharge Plan and Services       Living arrangements for the past 2 months: Single Family Home                                      Prior Living Arrangements/Services Living arrangements for the past 2 months: Single Family Home Lives with:: Self Patient language and need for interpreter reviewed:: Yes Do you feel safe going back to the place where you live?: Yes      Need for Family Participation in Patient Care: Yes (Comment) Care giver support system in place?: Yes (comment)   Criminal Activity/Legal Involvement Pertinent to Current Situation/Hospitalization: No - Comment as needed  Activities of Daily Living      Permission Sought/Granted                  Emotional  Assessment Appearance:: Appears stated age Attitude/Demeanor/Rapport: Engaged Affect (typically observed): Accepting Orientation: : Oriented to Self, Oriented to Place, Oriented to Situation, Oriented to  Time Alcohol / Substance Use: Not Applicable Psych Involvement: No (comment)  Admission diagnosis:  N/V, weakness Patient Active Problem List   Diagnosis Date Noted   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   Open wound of left heel 01/14/2022   Acute renal failure superimposed on stage 4 chronic kidney disease (HCC) 01/13/2022   Chest pain 01/13/2022   UTI (urinary tract infection) 12/31/2021   Chronic anticoagulation 12/31/2021   Prolonged QT interval 12/31/2021   Multifocal pneumonia 12/30/2021   SVT (supraventricular tachycardia) (HCC) 12/11/2021   Obesity, Class I, BMI 30-34.9 12/10/2021   Left leg cellulitis 12/07/2021   Hyperkalemia 10/09/2021   Hypothyroidism 10/09/2021   Hydronephrosis of right kidney 10/09/2021   Hypoglycemia 10/07/2021   Deep vein thrombosis (DVT) of left lower extremity (HCC) 10/07/2021   Acute renal failure superimposed on chronic kidney disease (HCC) 09/17/2021   CAP (community acquired pneumonia) 09/17/2021   Cellulitis of left lower extremity    Pressure injury of skin 06/03/2021   AKI (acute kidney injury) (HCC) 06/02/2021   Pain management    Cord compression (HCC)    Drug-induced constipation    Acute midline low back pain  with sciatica    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) 08/30/2020   Urinary retention 12/04/2019   BPH (benign prostatic hyperplasia) 11/24/2019   CKD (chronic kidney disease), stage IV (HCC) 11/24/2019   Chronic pain syndrome 11/24/2019   Physical deconditioning 11/24/2019   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, [place] - [address] [place] [address] Phone: [phone] Fax: [phone]     Social Drivers of Health (SDOH) Social History: SDOH Screenings   Food Insecurity: Low Risk  (03/27/2023)   Received from [facility]  Housing: Low Risk  (03/27/2023)   Received from [facility]  Transportation Needs:  Recent Concern: Transportation Needs - Unmet Transportation Needs (03/27/2023)   Received from [facility]  Utilities: Low Risk  (03/27/2023)   Received from [facility]  Depression (PHQ2-9): Low Risk  (02/08/2020)  Tobacco Use: Low Risk  (02/12/2023)   Received from [facility]   SDOH Interventions:     Readmission Risk Interventions    01/15/2022    2:56 PM 01/02/2022    2:03 PM 12/31/2021   10:32 AM  Readmission Risk Prevention Plan  Transportation Screening Complete Complete Complete  Medication Review Oceanographer) Complete Complete Complete  PCP or Specialist appointment within 3-5 days of discharge Complete Complete Complete  HRI or Home Care Consult Complete Complete Complete  SW Recovery Care/Counseling Consult Complete Complete Complete  Palliative Care Screening Not Applicable Not Applicable Not Applicable  Skilled Nursing Facility Not Applicable Not Applicable Not Applicable

## 2024-01-02 NOTE — Evaluation (Signed)
 Physical Therapy Evaluation Patient Details Name: Thomas [name] MRN: [id number] DOB: Jan 05, 1951 Today's Date: 01/02/2024  History of Present Illness  Pt is 73 year old presented to [facility] on  01/01/24 for lt flank pain. PMH - Spinal cord compression, rt BKA, ESRD on HD, DM, HTN, chronic pain, arthritis, back surgeries  Clinical Impression  Pt admitted with above diagnosis and presents to PT with functional limitations due to deficits listed below (See PT problem list). Pt needs skilled PT to maximize independence and safety. Pt from home alone and no longer mobilizing well enough to manage. Patient will benefit from continued inpatient follow up therapy, <3 hours/day.           If plan is discharge home, recommend the following: A lot of help with walking and/or transfers;A lot of help with bathing/dressing/bathroom;Assist for transportation;Help with stairs or ramp for entrance   Can travel by private vehicle   No    Equipment Recommendations    Recommendations for Other Services       Functional Status Assessment       Precautions / Restrictions Precautions Precautions: Fall Restrictions Weight Bearing Restrictions Per Provider Order: No      Mobility  Bed Mobility Overal bed mobility: Needs Assistance Bed Mobility: Supine to Sit, Sit to Supine     Supine to sit: Mod assist, HOB elevated Sit to supine: Min assist   General bed mobility comments: Assist to bring legs off of stretcher and elevate trunk into sitting. Assist to lower trunk back into supine.    Transfers                   General transfer comment: Did not attempt with 1 person assist    Ambulation/Gait                  Stairs            Wheelchair Mobility     Tilt Bed    Modified Rankin (Stroke Patients Only)       Balance Overall balance assessment: Needs assistance Sitting-balance support: Single extremity supported, Feet supported Sitting balance-[name]:  Poor Sitting balance - Comments: UE support                                     Pertinent Vitals/Pain Pain Assessment Pain Assessment: Faces Faces Pain Scale: Hurts whole lot Pain Location: RUE with certain movements/positions    Home Living Family/patient expects to be discharged to:: Private residence Living Arrangements: Alone Available Help at Discharge: Available PRN/intermittently Type of Home: House Home Access: Stairs to enter Entrance Stairs-Rails: None Entrance Stairs-Number of Steps: 1   Home Layout: One level Home Equipment: Agricultural consultant (2 wheels);Cane - single point;Crutches Additional Comments: Pt reports he sleeps in a wingback chair    Prior Function Prior Level of Function : Patient poor historian/Family not available             Mobility Comments: Pt amb household distances with rolling walker and prosthetic ADLs Comments: sponge baths     Extremity/Trunk Assessment   Upper Extremity Assessment Upper Extremity Assessment: Defer to OT evaluation    Lower Extremity Assessment Lower Extremity Assessment: RLE deficits/detail;Generalized weakness RLE Deficits / Details: BKA    Cervical / Trunk Assessment Cervical / Trunk Assessment: Kyphotic;Back Surgery  Communication   Communication Communication: Impaired Factors Affecting Communication: Reduced clarity of speech  Cognition Arousal: Alert Behavior During Therapy: WFL for tasks assessed/performed   PT - Cognitive impairments: Problem solving, Memory, Safety/Judgement                         Following commands: Impaired Following commands impaired: Only follows one step commands consistently     Cueing Cueing Techniques: Verbal cues, Tactile cues     General Comments General comments (skin integrity, edema, etc.): VSS on RA    Exercises     Assessment/Plan    PT Assessment Patient needs continued PT services  PT Problem List Decreased  strength;Decreased activity tolerance;Decreased balance;Decreased range of motion;Decreased mobility;Pain       PT Treatment Interventions DME instruction;Gait training;Functional mobility training;Therapeutic activities;Therapeutic exercise;Balance training;Patient/family education    PT Goals (Current goals can be found in the Care Plan section)  Acute Rehab PT Goals Patient Stated Goal: go to rehab PT Goal Formulation: With patient Time For Goal Achievement: 01/16/24 Potential to Achieve Goals: Good    Frequency Min 1X/week     Co-evaluation               AM-PAC PT "6 Clicks" Mobility  Outcome Measure Help needed turning from your back to your side while in a flat bed without using bedrails?: A Little Help needed moving from lying on your back to sitting on the side of a flat bed without using bedrails?: A Lot Help needed moving to and from a bed to a chair (including a wheelchair)?: Total Help needed standing up from a chair using your arms (e.g., wheelchair or bedside chair)?: Total Help needed to walk in hospital room?: Total Help needed climbing 3-5 steps with a railing? : Total 6 Click Score: 9    End of Session   Activity Tolerance: Patient limited by pain Patient left: in bed;with call bell/phone within reach   PT Visit Diagnosis: Unsteadiness on feet (R26.81);Other abnormalities of gait and mobility (R26.89);Muscle weakness (generalized) (M62.81);History of falling (Z91.81);Pain Pain - Right/Left: Right Pain - part of body: Arm;Hand;Shoulder    Time: 6962-9528 PT Time Calculation (min) (ACUTE ONLY): 15 min   Charges:   PT Evaluation $PT Eval Moderate Complexity: 1 Mod   PT General Charges $$ ACUTE PT VISIT: 1 Visit         [facility] PT Acute Rehabilitation Services Office [phone]   Angelina Ok [facility] 01/02/2024, 1:25 PM

## 2024-01-02 NOTE — ED Notes (Signed)
 Patient arrives back to ED from dialysis

## 2024-01-02 NOTE — ED Provider Notes (Signed)
  Physical Exam  BP 124/74   Pulse (!) 51   Temp 97.6 F (36.4 C)   Resp 18   SpO2 91%   Physical Exam  Procedures  Procedures  ED Course / MDM    Medical Decision Making Amount and/or Complexity of Data Reviewed Labs: ordered. Radiology: ordered.  Risk Prescription drug management.   It appears of plan is to evaluate for possible placement.  However do not see that this happened.  PT evaluation has been ordered yesterday but did not see that was done.  Reordered today.  Nursing will contact social work about further planning.        Benjiman Core, MD 01/02/24 1059

## 2024-01-03 LAB — CBC
HCT: 29.3 % — ABNORMAL LOW (ref 39.0–52.0)
Hemoglobin: 9.5 g/dL — ABNORMAL LOW (ref 13.0–17.0)
MCH: 34.3 pg — ABNORMAL HIGH (ref 26.0–34.0)
MCHC: 32.4 g/dL (ref 30.0–36.0)
MCV: 105.8 fL — ABNORMAL HIGH (ref 80.0–100.0)
Platelets: 209 10*3/uL (ref 150–400)
RBC: 2.77 MIL/uL — ABNORMAL LOW (ref 4.22–5.81)
RDW: 18.4 % — ABNORMAL HIGH (ref 11.5–15.5)
WBC: 6.8 10*3/uL (ref 4.0–10.5)
nRBC: 0 % (ref 0.0–0.2)

## 2024-01-03 LAB — PROTIME-INR
INR: 1.2 (ref 0.8–1.2)
Prothrombin Time: 15.1 s (ref 11.4–15.2)

## 2024-01-03 MED ORDER — HYDROMORPHONE HCL 1 MG/ML IJ SOLN
1.0000 mg | Freq: Once | INTRAMUSCULAR | Status: AC
  Administered 2024-01-03: 1 mg via INTRAVENOUS
  Filled 2024-01-03: qty 1

## 2024-01-03 MED ORDER — DOCUSATE SODIUM 100 MG PO CAPS
100.0000 mg | ORAL_CAPSULE | Freq: Once | ORAL | Status: AC
  Administered 2024-01-03: 100 mg via ORAL
  Filled 2024-01-03: qty 1

## 2024-01-03 MED ORDER — ENOXAPARIN SODIUM 100 MG/ML IJ SOSY
90.0000 mg | PREFILLED_SYRINGE | Freq: Once | INTRAMUSCULAR | Status: AC
  Administered 2024-01-03: 90 mg via SUBCUTANEOUS
  Filled 2024-01-03: qty 0.9

## 2024-01-03 MED ORDER — WARFARIN SODIUM 10 MG PO TABS
10.0000 mg | ORAL_TABLET | Freq: Once | ORAL | Status: AC
  Administered 2024-01-03: 10 mg via ORAL
  Filled 2024-01-03: qty 1

## 2024-01-03 MED ORDER — BISACODYL 10 MG RE SUPP
10.0000 mg | Freq: Once | RECTAL | Status: AC
Start: 2024-01-03 — End: 2024-01-03
  Administered 2024-01-03: 10 mg via RECTAL
  Filled 2024-01-03: qty 1

## 2024-01-03 NOTE — Progress Notes (Addendum)
 PHARMACY - ANTICOAGULATION CONSULT NOTE  Pharmacy Consult for PTA warfarin Indication: history of pulmonary embolus  Patient Measurements: Weight:  (patient in ER stretcher and not zero'd out) Vital Signs: Temp: 98.5 F (36.9 C) (03/16 0832) Temp Source: Oral (03/16 0832) BP: 116/64 (03/16 1017) Pulse Rate: 69 (03/16 0832)  Labs: Recent Labs    01/01/24 1615 01/01/24 1930 01/02/24 0045 01/02/24 1349  HGB  --  8.5*  --  9.4*  HCT  --  26.4*  --  29.4*  PLT  --  176  --  180  LABPROT  --   --  14.8  --   INR  --   --  1.1  --   CREATININE 5.88*  --   --  7.34*    CrCl cannot be calculated (Unknown ideal weight.).  Medications:  -Warfarin 5mg  PO daily (Last dose unknown ~10 days ago, stated has stopped given high INR ~10 days ago); dose based on [facility] note from 2/18  Assessment: 13 yoM presented to ED with left flank pain with CT showing asymmetrical thickening of bladder. Pharmacy consulted to dose PTA warfarin for history of PE. History of Xarelto/eliquis and switched to warfarin for possible eliquis failure (2023) given DVT propagation  3/15 INR 1.1 >> given 10 mg 3/16 INR 1.2  Hgb 8.5>>9.5, plts 176>>209  Goal of Therapy:  INR 2-3 Monitor platelets by anticoagulation protocol: Yes   Plan:  Give 10mg  warfarin this evening - further dosing per INR --Will discuss possible parenteral bridge options with EDP Daily INR/CBC  Delmar Landau, PharmD, BCPS 01/03/2024 11:09 AM ED Clinical Pharmacist -  [phone]   ADDENDUM: 01/03/24 1:28 PM Will give 90mg  enoxaparin today per verbal order from MD Trifan. Re-evaluate need for bridge therapy 01/04/24. Monitor carefully as patient is HD status. Discussed possible UFH instead but opted for enoxaparin so as to not interfere with possible disposition plan.  Delmar Landau, PharmD, BCPS 01/03/2024 1:29 PM ED Clinical Pharmacist -  [phone]

## 2024-01-03 NOTE — Progress Notes (Signed)
 Nephrology Follow-Up Consult note   Assessment/Recommendations: Thomas [name] is a/an 73 y.o. male with a past medical history significant for ESRD, admitted for flank pain and failure to thrive.      Dialysis Orders:  Center: [facility]  on TTS . 180NRe TTS 4 hr 15 min BFRO 400 DFR Auto 1.5 EDW 92kg 2K 2.5Ca TDC No heparin Mircera IV q 2 weeks- last dose Calcitriol 0.18mcg PO q HD Cefepime 2000mg  IV q HD- stop date 01/09/24 Daptomycin 700mg  IV q T/Th, 850mg  q Saturday. Stop date 01/09/24   Assessment/Plan:  Flank pain: Initially presented with flank pain, CT showed asymmetrical bladder thickening and chronic hydronephrosis. Plan for outpt urology follow up.   ESRD:  On TTS schedule, has not missed dialysis lately.  Continue dialysis per schedule Recent osteomyelitis: On cefepime and daptopmycin through 01/09/24 but RN reports they noticed worsening AMS after giving antibiotics lately.  Have attempted to get records.  Using ceftazidime instead of cefepime.  Continuing daptomycin as well  Hypertension/volume: Blood pressure acceptable.  Ultrafiltration as tolerated  Anemia: Hgb 9.4, mircera last given 12/29/23, not due for next dose yet  Metabolic bone disease: Corrected calcium and phosphorus at goal.  Continue home binder  Nutrition: Fluid restrictions, can liberalize diet if needed Dispo: Pt reports debility and difficulty caring for himself at home. Recent ED visits for AMS and pain. Appears plan is for SNF    Recommendations conveyed to primary service.    Darnell Level [facility] 01/03/2024 9:01 AM  ___________________________________________________________  CC: Flank pain  Interval History/Subjective: Patient had dialysis yesterday with no issues.  Denies any complaints   Medications:  Current Facility-Administered Medications  Medication Dose Route Frequency Provider Last Rate Last Admin   amLODipine (NORVASC) tablet 5 mg  5 mg Oral Daily  Rolan Bucco, MD   5 mg at 01/02/24 1350   atorvastatin (LIPITOR) tablet 80 mg  80 mg Oral Daily Rolan Bucco, MD   80 mg at 01/02/24 1350   calcitRIOL (ROCALTROL) capsule 0.25 mcg  0.25 mcg Oral Daily Rolan Bucco, MD   0.25 mcg at 01/02/24 1350   cefTAZidime (FORTAZ) 1 g in sodium chloride 0.9 % 100 mL IVPB  1 g Intravenous Q T,Th,Sa-HD Collins, Samantha G, PA-C   Stopped at 01/02/24 2004   Chlorhexidine Gluconate Cloth 2 % PADS 6 each  6 each Topical Q0600 Oretha Milch, PA-C       cloNIDine (CATAPRES) tablet 0.1 mg  0.1 mg Oral TID Rolan Bucco, MD   0.1 mg at 01/02/24 2100   DAPTOmycin (CUBICIN) 900 mg in sodium chloride 0.9 % IVPB  900 mg Intravenous Q Sat-HD Oretha Milch, PA-C   Stopped at 01/02/24 2021   [START ON 01/05/2024] DAPTOmycin (CUBICIN) IVPB 700 mg/143mL premix  700 mg Intravenous Q Tue-HD Collins, Samantha G, PA-C       [START ON 01/07/2024] DAPTOmycin (CUBICIN) IVPB 700 mg/142mL premix  700 mg Intravenous Q Thu-HD Collins, Samantha G, PA-C       finasteride (PROSCAR) tablet 5 mg  5 mg Oral Daily Belfi, Shawna Orleans, MD       hydrALAZINE (APRESOLINE) tablet 100 mg  100 mg Oral Q8H Belfi, Melanie, MD   100 mg at 01/02/24 2100   levothyroxine (SYNTHROID) tablet 50 mcg  50 mcg Oral Q0600 Rolan Bucco, MD   50 mcg at 01/03/24 0530   oxyCODONE (OXYCONTIN) 12 hr tablet 30 mg  30 mg Oral Q12H Rolan Bucco, MD  30 mg at 01/02/24 2100   tamsulosin (FLOMAX) capsule 0.4 mg  0.4 mg Oral Daily Rolan Bucco, MD   0.4 mg at 01/02/24 1350   Warfarin - Pharmacist Dosing Inpatient   Does not apply q1600 Arabella Merles, [facility]       Current Outpatient Medications  Medication Sig Dispense Refill   acetaminophen (TYLENOL) 650 MG CR tablet Take 1,300 mg by mouth every 8 (eight) hours as needed for pain.     amLODipine (NORVASC) 5 MG tablet Take 5 mg by mouth daily.     atorvastatin (LIPITOR) 80 MG tablet Take 80 mg by mouth daily.     bethanechol (URECHOLINE) 10 MG tablet Take 10  mg by mouth 2 (two) times daily.     cephALEXin (KEFLEX) 500 MG capsule Take 1 capsule (500 mg total) by mouth 2 (two) times daily. 14 capsule 0   Cholecalciferol 125 MCG (5000 UT) capsule Take 5,000 Units by mouth daily.     cloNIDine (CATAPRES) 0.1 MG tablet Take 0.1 mg by mouth 3 (three) times daily.     cyanocobalamin (VITAMIN B12) 1000 MCG tablet Take 1 tablet by mouth daily.     furosemide (LASIX) 40 MG tablet Take 40 mg by mouth daily.     hydrALAZINE (APRESOLINE) 25 MG tablet Take 25 mg by mouth 3 (three) times daily.     levothyroxine (SYNTHROID) 50 MCG tablet Take 50 mcg by mouth daily before breakfast.      losartan (COZAAR) 50 MG tablet Take 50 mg by mouth at bedtime.     magnesium oxide (MAG-OX) 400 MG tablet Take 800 mg by mouth in the morning, at noon, and at bedtime.     metoprolol tartrate (LOPRESSOR) 50 MG tablet Take 1 tablet (50 mg total) by mouth 2 (two) times daily. 60 tablet 1   OXYCONTIN 30 MG 12 hr tablet Take 30 mg by mouth 3 (three) times daily as needed (pain).     senna-docusate (SENOKOT-S) 8.6-50 MG tablet Take 4 tablets by mouth 2 (two) times daily.     sevelamer (RENAGEL) 800 MG tablet Take 1,600 mg by mouth 3 (three) times daily.     tamsulosin (FLOMAX) 0.4 MG CAPS capsule Take 1 capsule (0.4 mg total) by mouth daily. 30 capsule 0   tiZANidine (ZANAFLEX) 4 MG tablet Take 4 mg by mouth every 8 (eight) hours as needed for muscle spasms.     Vitamin E 670 MG (1000 UT) CAPS Take 1,000 mg by mouth daily.     pantoprazole (PROTONIX) 40 MG tablet Take 1 tablet by mouth 2 times daily. (Patient not taking: Reported on 01/02/2024) 60 tablet 0   warfarin (COUMADIN) 5 MG tablet Take 0.5 tablets (2.5 mg total) by mouth daily. (Patient not taking: Reported on 01/02/2024)        Review of Systems: 10 systems reviewed and negative except per interval history/subjective  Physical Exam: Vitals:   01/03/24 0825 01/03/24 0832  BP:  125/75  Pulse:  69  Resp:  14  Temp:  98.5  F (36.9 C)  SpO2: 100% 100%   No intake/output data recorded.  Intake/Output Summary (Last 24 hours) at 01/03/2024 0901 Last data filed at 01/03/2024 0000 Gross per 24 hour  Intake 68.57 ml  Output 2200 ml  Net -2131.43 ml   Constitutional: Chronically ill-appearing, no distress ENMT: ears and nose without scars or lesions, MMM, tongue protruding from mouth CV: normal rate, trace edema in the lower extremities Respiratory: Bilateral chest  rise, normal work of breathing Gastrointestinal: soft, non-tender, no palpable masses or hernias Skin: Chronic venous stasis changes of the bilateral lower extremities, right BKA, otherwise no visible lesions or rashes Psych: Alert, appropriate mood and affect   Test Results I personally reviewed new and old clinical labs and radiology tests Lab Results  Component Value Date   NA 139 01/02/2024   K 4.4 01/02/2024   CL 98 01/02/2024   CO2 29 01/02/2024   BUN 21 01/02/2024   CREATININE 7.34 (H) 01/02/2024   CALCIUM 8.1 (L) 01/02/2024   ALBUMIN 3.0 (L) 01/02/2024   PHOS 5.4 (H) 01/02/2024    CBC Recent Labs  Lab 01/01/24 1930 01/02/24 1349  WBC 5.1 5.1  NEUTROABS 3.0  --   HGB 8.5* 9.4*  HCT 26.4* 29.4*  MCV 103.5* 105.4*  PLT 176 180

## 2024-01-03 NOTE — ED Provider Notes (Signed)
 Emergency Medicine Observation Re-evaluation Note  Thomas [name] is a 73 y.o. male, seen on rounds today.  Pt initially presented to the ED for complaints of Weakness Currently, the patient is resting.  Physical Exam  BP 125/75 (BP Location: Right Arm)   Pulse 69   Temp 98.5 F (36.9 C) (Oral)   Resp 14   SpO2 100%  Physical Exam General: NAD Cardiac: Regular HR Lungs: No respiratory distress  ED Course / MDM  EKG:EKG Interpretation Date/Time:  Friday January 01 2024 14:40:25 EDT Ventricular Rate:  77 PR Interval:  208 QRS Duration:  103 QT Interval:  487 QTC Calculation: 495 R Axis:   -11  Text Interpretation: Sinus rhythm Multiple ventricular premature complexes Borderline prolonged QT interval Confirmed by Rolan Bucco [phone]) on 01/01/2024 5:08:38 PM  I have reviewed the labs performed to date as well as medications administered while in observation.    Plan  Current plan is for [facility] assistance for SNF placement  Nephrology following for dialysis needs PT eval Patient on chronic antibiotics      Celena Lanius, Kermit Balo, MD 01/03/24 [phone]

## 2024-01-04 LAB — CK: Total CK: 54 U/L (ref 49–397)

## 2024-01-04 LAB — CBC
HCT: 28 % — ABNORMAL LOW (ref 39.0–52.0)
Hemoglobin: 8.9 g/dL — ABNORMAL LOW (ref 13.0–17.0)
MCH: 33.5 pg (ref 26.0–34.0)
MCHC: 31.8 g/dL (ref 30.0–36.0)
MCV: 105.3 fL — ABNORMAL HIGH (ref 80.0–100.0)
Platelets: 175 10*3/uL (ref 150–400)
RBC: 2.66 MIL/uL — ABNORMAL LOW (ref 4.22–5.81)
RDW: 18 % — ABNORMAL HIGH (ref 11.5–15.5)
WBC: 5.5 10*3/uL (ref 4.0–10.5)
nRBC: 0 % (ref 0.0–0.2)

## 2024-01-04 LAB — PROTIME-INR
INR: 1.2 (ref 0.8–1.2)
Prothrombin Time: 15.4 s — ABNORMAL HIGH (ref 11.4–15.2)

## 2024-01-04 MED ORDER — WARFARIN SODIUM 7.5 MG PO TABS
7.5000 mg | ORAL_TABLET | Freq: Once | ORAL | Status: AC
  Administered 2024-01-04: 7.5 mg via ORAL
  Filled 2024-01-04: qty 1

## 2024-01-04 MED ORDER — CALCITRIOL 0.25 MCG PO CAPS
0.2500 ug | ORAL_CAPSULE | ORAL | Status: DC
  Administered 2024-01-05: 0.25 ug via ORAL
  Filled 2024-01-04: qty 1

## 2024-01-04 MED ORDER — CHLORHEXIDINE GLUCONATE CLOTH 2 % EX PADS: 6.0000 | MEDICATED_PAD | Freq: Every day | CUTANEOUS | Status: DC

## 2024-01-04 NOTE — Progress Notes (Signed)
 PHARMACY - ANTICOAGULATION CONSULT NOTE  Pharmacy Consult for PTA warfarin Indication: history of pulmonary embolus  Patient Measurements: Weight:  (patient in ER stretcher and not zero'd out) Vital Signs: Temp: 98.2 F (36.8 C) (03/17 0756) Temp Source: Oral (03/17 0448) BP: 149/101 (03/17 1100) Pulse Rate: 82 (03/17 1100)  Labs: Recent Labs    01/01/24 1615 01/01/24 1930 01/02/24 0045 01/02/24 1349 01/03/24 1115 01/04/24 0602  HGB  --    < >  --  9.4* 9.5* 8.9*  HCT  --    < >  --  29.4* 29.3* 28.0*  PLT  --    < >  --  180 209 175  LABPROT  --   --  14.8  --  15.1 15.4*  INR  --   --  1.1  --  1.2 1.2  CREATININE 5.88*  --   --  7.34*  --   --   CKTOTAL  --   --   --   --   --  54   < > = values in this interval not displayed.    CrCl cannot be calculated (Unknown ideal weight.).  Medications:  -Warfarin 5mg  PO daily (Last dose unknown ~10 days ago, stated has stopped given high INR ~10 days ago); dose based on [facility] note from 2/18  Assessment: 80 yoM presented to ED with left flank pain with CT showing asymmetrical thickening of bladder. Pharmacy consulted to dose PTA warfarin for history of PE. History of Xarelto/eliquis and switched to warfarin for possible eliquis failure (2023) given DVT propagation  3/15 INR 1.1 >> given 10 mg 3/16 INR 1.2 >> given 10 mg, one time dose of lovenox 90mg  3/17 INR 1.2   Patient given warfarin 10mg  x2 doses, but PTA  dose is reported as warfarin 5mg  daily. He is ESRD and received a one time dose of Lovenox yesterday, not scheduled for dialysis until tomorrow. Discussed with ED provider and he wants to hold on on additional bridge therapy as the patient has not had dialysis and the effects of Lovenox should still be onboard.  CBC stable  Goal of Therapy:  INR 2-3 Monitor platelets by anticoagulation protocol: Yes   Plan:  Give 7.5 mg warfarin this evening  Daily INR/CBC  Ruben Im, PharmD Clinical  Pharmacist 01/04/2024 11:16 AM Please check AMION for all [facility] Pharmacy numbers\

## 2024-01-04 NOTE — Progress Notes (Signed)
 [facility] Progress Note  Subjective:  Seen in room, no c/o's today, in good spirits  Vitals:   01/04/24 0943 01/04/24 1100 01/04/24 1200 01/04/24 1343  BP: (!) 179/107 (!) 149/101 (!) 158/97 (!) 146/82  Pulse:  82    Resp:  17    Temp:   98.3 F (36.8 C)   TempSrc:   Oral   SpO2:  100%      Exam: General: alert, NAD, has periodic spasms of pain in his R neck/ back, says it is chronic Lungs: Clear bilat to auscultation without wheezes, rales, or rhonchi Heart: RRR with normal S1, S2. Abdomen: Soft, non-tender,  with normoactive bowel sounds.  Lower extremities: No edema or ischemic changes, no open wounds. Neuro: Alert and oriented X 3. Moves all extremities spontaneously. Psych:  normal affect. MS: R BKA, trace woody edema L lower extremities HD access: RIJ TDC   OP HD: [place] TTS  4h  B400   92kg  2K bath  RIJ TDC  Heparin none Mircera IV q 2 weeks- last dose Calcitriol 0.32mcg PO q HD Cefepime 2000mg  IV q HD- stop date 01/09/24 Daptomycin 700mg  IV q T/Th, 850mg  q Saturday. Stop date 01/09/24   Assessment/Plan:  Flank pain: Initially presented with flank pain, CT showed asymmetrical bladder thickening and chronic hydronephrosis. Plan for outpt urology follow up.   ESRD:  On TTS schedule, has not missed HD. Had HD here Sat, next HD tomorrow.  Recent osteomyelitis: On IV cefepime/ dapto through 01/09/24 but RN reported worsening AMS assoc w/ giving antibiotics lately. Cefepime can cause encephalopathy so it was dc'd and ceftazidime used in it's place.   Hypertension/volume: Respiratory status stable, some edema on exam. BP stable. UFG 2- 2.5 L as tolerated.    Anemia: Hgb 8.5, mircera last given 12/29/23, not due for next dose yet  Metabolic bone disease: Corrected calcium at goal, will check phos. Continue renvela  Nutrition:  Will need renal diet/fluid restrictions Dispo: Pt reports debility and difficulty caring for himself at home. Recent ED  visits for AMS and pain. Appears plan is for SNF     Vinson Moselle MD  [facility] 01/04/2024, 3:27 PM  Recent Labs  Lab 01/01/24 1615 01/01/24 1930 01/02/24 1349 01/03/24 1115 01/04/24 0602  HGB  --    < > 9.4* 9.5* 8.9*  ALBUMIN 3.1*  --  3.0*  --   --   CALCIUM 8.2*  --  8.1*  --   --   PHOS  --   --  5.4*  --   --   CREATININE 5.88*  --  7.34*  --   --   K 4.3  --  4.4  --   --    < > = values in this interval not displayed.   No results for input(s): "IRON", "TIBC", "FERRITIN" in the last 168 hours. Inpatient medications:  amLODipine  5 mg Oral Daily   atorvastatin  80 mg Oral Daily   calcitRIOL  0.25 mcg Oral Daily   Chlorhexidine Gluconate Cloth  6 each Topical Q0600   finasteride  5 mg Oral Daily   hydrALAZINE  100 mg Oral Q8H   levothyroxine  50 mcg Oral Q0600   oxyCODONE  30 mg Oral Q12H   tamsulosin  0.4 mg Oral Daily   warfarin  7.5 mg Oral ONCE-1600   Warfarin - Pharmacist Dosing Inpatient   Does not apply q1600    cefTAZidime (FORTAZ)  IV Stopped (  01/02/24 2004)   DAPTOmycin Stopped (01/02/24 2021)   [START ON 01/05/2024] DAPTOmycin     [START ON 01/07/2024] DAPTOmycin

## 2024-01-04 NOTE — Progress Notes (Signed)
 Bed offers pending. Two SNFs have declined. Awaiting review at others.

## 2024-01-04 NOTE — ED Provider Notes (Addendum)
 Emergency Medicine Observation Re-evaluation Note  Thomas [name] is a 73 y.o. male, seen on rounds today.  Pt initially presented to the ED for complaints of Weakness Currently, the patient is nursing facility placement.  Patient also being followed by nephrology for dialysis needs.  Last complete renal function was done on the 15th.  And potassium was 4.4 at that time.  Physical Exam  BP (!) 179/107   Pulse 83   Temp 98.2 F (36.8 C)   Resp 17   SpO2 100%  Physical Exam General: Nontoxic no acute distress Cardiac:  Lungs: No respiratory distress. Psych: Baseline  ED Course / MDM  EKG:EKG Interpretation Date/Time:  Friday January 01 2024 14:40:25 EDT Ventricular Rate:  77 PR Interval:  208 QRS Duration:  103 QT Interval:  487 QTC Calculation: 495 R Axis:   -11  Text Interpretation: Sinus rhythm Multiple ventricular premature complexes Borderline prolonged QT interval Confirmed by Rolan Bucco [phone]) on 01/01/2024 5:08:38 PM  I have reviewed the labs performed to date as well as medications administered while in observation.  Recent changes in the last 24 hours include nothing significant.  Patient did have INR done that was 1.2.  Patient had CBC done white count 5.5 hemoglobin 8.9 platelets 175 CK total is 54 patient's hepatitis B Serpe GYN antigen was nonreactive.  Hepatitis B antibody quantitative in process.  Plan  Current plan is for nursing home placement.  Also will check to see whether basic metabolic panel needs to be rechecked today.  Last time it was done was on March 15.  Patient is being followed by nephrology.    Vanetta Mulders, MD 01/04/24 1914    Vanetta Mulders, MD 01/04/24 [phone]

## 2024-01-05 ENCOUNTER — Other Ambulatory Visit (HOSPITAL_COMMUNITY): Payer: Self-pay

## 2024-01-05 LAB — PROTIME-INR
INR: 1.1 (ref 0.8–1.2)
Prothrombin Time: 14.8 s (ref 11.4–15.2)

## 2024-01-05 LAB — CBC
HCT: 27.7 % — ABNORMAL LOW (ref 39.0–52.0)
Hemoglobin: 8.9 g/dL — ABNORMAL LOW (ref 13.0–17.0)
MCH: 34 pg (ref 26.0–34.0)
MCHC: 32.1 g/dL (ref 30.0–36.0)
MCV: 105.7 fL — ABNORMAL HIGH (ref 80.0–100.0)
Platelets: 182 10*3/uL (ref 150–400)
RBC: 2.62 MIL/uL — ABNORMAL LOW (ref 4.22–5.81)
RDW: 18.4 % — ABNORMAL HIGH (ref 11.5–15.5)
WBC: 5.9 10*3/uL (ref 4.0–10.5)
nRBC: 0 % (ref 0.0–0.2)

## 2024-01-05 LAB — PHOSPHORUS: Phosphorus: 6.1 mg/dL — ABNORMAL HIGH (ref 2.5–4.6)

## 2024-01-05 LAB — HEPATITIS B SURFACE ANTIBODY, QUANTITATIVE: Hep B S AB Quant (Post): 4.3 m[IU]/mL — ABNORMAL LOW

## 2024-01-05 LAB — BASIC METABOLIC PANEL
Anion gap: 13 (ref 5–15)
BUN: 48 mg/dL — ABNORMAL HIGH (ref 8–23)
CO2: 25 mmol/L (ref 22–32)
Calcium: 8 mg/dL — ABNORMAL LOW (ref 8.9–10.3)
Chloride: 97 mmol/L — ABNORMAL LOW (ref 98–111)
Creatinine, Ser: 9.23 mg/dL — ABNORMAL HIGH (ref 0.61–1.24)
GFR, Estimated: 6 mL/min — ABNORMAL LOW (ref >60)
Glucose, Bld: 102 mg/dL — ABNORMAL HIGH (ref 70–99)
Potassium: 4.3 mmol/L (ref 3.5–5.1)
Sodium: 135 mmol/L (ref 135–145)

## 2024-01-05 MED ORDER — ONDANSETRON 4 MG PO TBDP
4.0000 mg | ORAL_TABLET | Freq: Three times a day (TID) | ORAL | Status: DC | PRN
  Administered 2024-01-05: 4 mg via ORAL
  Filled 2024-01-05: qty 1

## 2024-01-05 MED ORDER — OXYCODONE HCL 5 MG PO TABS
10.0000 mg | ORAL_TABLET | ORAL | Status: DC | PRN
  Administered 2024-01-05 (×3): 10 mg via ORAL
  Filled 2024-01-05 (×3): qty 2

## 2024-01-05 MED ORDER — WARFARIN SODIUM 7.5 MG PO TABS
15.0000 mg | ORAL_TABLET | Freq: Once | ORAL | Status: AC
  Administered 2024-01-05: 15 mg via ORAL
  Filled 2024-01-05: qty 2

## 2024-01-05 NOTE — Progress Notes (Signed)
 PHARMACY - ANTICOAGULATION CONSULT NOTE  Pharmacy Consult for PTA warfarin Indication: history of pulmonary embolus  Patient Measurements: Weight:  (patient in ER stretcher and not zero'd out) Vital Signs: Temp: 98.1 F (36.7 C) (03/18 0939) BP: 137/78 (03/18 1423) Pulse Rate: 82 (03/18 0939)  Labs: Recent Labs    01/03/24 1115 01/04/24 0602 01/05/24 0509  HGB 9.5* 8.9* 8.9*  HCT 29.3* 28.0* 27.7*  PLT 209 175 182  LABPROT 15.1 15.4* 14.8  INR 1.2 1.2 1.1  CREATININE  --   --  9.23*  CKTOTAL  --  54  --     CrCl cannot be calculated (Unknown ideal weight.).  Medications:  -Warfarin 5mg  PO daily (Last dose unknown ~10 days ago, stated has stopped given high INR ~10 days ago); dose based on [facility] note from 2/18  Assessment: 46 yoM presented to ED with left flank pain with CT showing asymmetrical thickening of bladder. Pharmacy consulted to dose PTA warfarin for history of PE. History of Xarelto/eliquis and switched to warfarin for possible eliquis failure (2023) given DVT propagation  3/15 INR 1.1 >> given 10 mg 3/16 INR 1.2 >> given 10 mg, one time dose of lovenox 90mg  3/17 INR 1.2 >> given 7.5 mg, due for HD today 3/18 INR 1.1 >>   Patient given 3 doses of warfarin, without rise in INR. Today decided to reinvestigated the prior to admission warfarin dose and found pharmacy fill history for 10mg  every day. Patient is not a reliable historian. He is ESRD and received a one time dose of Lovenox 3/16, scheduled for dialysis later today. Discussed with ED provider and he wants to hold additional bridge therapy as the patient has not had dialysis and the effects of Lovenox should still be onboard.  CBC stable  Long term this patient may not be a good candidate for warfarin. It appears his last DVT/PE was several years ago. DOAC therapy may be optimal in this situation.   Goal of Therapy:  INR 2-3 Monitor platelets by anticoagulation protocol: Yes   Plan:  Give 15 mg  warfarin this evening  Daily INR/CBC  Ruben Im, PharmD Clinical Pharmacist 01/05/2024 2:54 PM Please check AMION for all [facility] Pharmacy numbers\

## 2024-01-05 NOTE — Progress Notes (Signed)
 Pt receives out-pt HD at [facility] on TTS 9:55 am chair time. Will assist as needed.   Olivia Canter Renal Navigator [phone]

## 2024-01-05 NOTE — ED Notes (Signed)
 Assumed pt care from [facility]

## 2024-01-05 NOTE — ED Provider Notes (Signed)
 Emergency Medicine Observation Re-evaluation Note  Thomas [name] is a 73 y.o. male, seen on rounds today.  Pt initially presented to the ED for complaints of Weakness Currently, the patient is sitting in bed resting had just eaten breakfast.  Physical Exam  BP 135/85   Pulse 78   Temp 98.2 F (36.8 C)   Resp 18   SpO2 99%  Physical Exam General: Laying in bed  Cardiac: No murmur on my initial exam Lungs: Clear bilaterally Psych: No agitation at this time  ED Course / MDM  EKG:EKG Interpretation Date/Time:  Friday January 01 2024 14:40:25 EDT Ventricular Rate:  77 PR Interval:  208 QRS Duration:  103 QT Interval:  487 QTC Calculation: 495 R Axis:   -11  Text Interpretation: Sinus rhythm Multiple ventricular premature complexes Borderline prolonged QT interval Confirmed by Rolan Bucco [phone]) on 01/01/2024 5:08:38 PM  I have reviewed the labs performed to date as well as medications administered while in observation.  Recent changes in the last 24 hours include nursing requested we speak to nephrology about anticoagulation and INR.  Patient should have dialysis today and is still awaiting placement.  He was given Coumadin over the last few days and his INR still is not therapeutic.  He was given a Lovenox dose several days ago on Sunday but he is still not therapeutic.  They requested we discuss if he would need adjustments to this after dialysis today.  Plan  Current plan is for awaiting placement.    Malaysia Crance, Canary Brim, MD 01/05/24 1225

## 2024-01-05 NOTE — Progress Notes (Signed)
 Expected Discharge Plan: Skilled Nursing Facility Barriers to Discharge: Demetrius Charity) ED SNF auth   Patient Goals and CMS Choice Patient states their goals for this hospitalization and ongoing recovery are:: SNF      Expected Discharge Plan and Services Expected Discharge Plan: Skilled Nursing Facility       Living arrangements for the past 2 months: Single Family Home                                      Prior Living Arrangements/Services Living arrangements for the past 2 months: Single Family Home Lives with:: Self Patient language and need for interpreter reviewed:: Yes Do you feel safe going back to the place where you live?: Yes      Need for Family Participation in Patient Care: Yes (Comment) Care giver support system in place?: Yes (comment)   Criminal Activity/Legal Involvement Pertinent to Current Situation/Hospitalization: No - Comment as needed  Activities of Daily Living      Permission Sought/Granted                  Emotional Assessment Appearance:: Appears stated age Attitude/Demeanor/Rapport: Engaged Affect (typically observed): Accepting Orientation: : Oriented to Self, Oriented to Place, Oriented to Situation, Oriented to  Time Alcohol / Substance Use: Not Applicable Psych Involvement: No (comment)  Admission diagnosis:  Left flank pain [R10.9] Patient Active Problem List   Diagnosis Date Noted   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   Open wound of left heel 01/14/2022   Acute renal failure superimposed on stage 4 chronic kidney disease (HCC) 01/13/2022   Chest pain 01/13/2022   UTI (urinary tract infection) 12/31/2021   Chronic anticoagulation 12/31/2021   Prolonged QT interval 12/31/2021   Multifocal pneumonia 12/30/2021   SVT (supraventricular tachycardia) (HCC) 12/11/2021   Obesity, Class I, BMI 30-34.9 12/10/2021   Left leg cellulitis 12/07/2021   Hyperkalemia 10/09/2021   Hypothyroidism 10/09/2021   Hydronephrosis of right  kidney 10/09/2021   Hypoglycemia 10/07/2021   Deep vein thrombosis (DVT) of left lower extremity (HCC) 10/07/2021   Acute renal failure superimposed on chronic kidney disease (HCC) 09/17/2021   CAP (community acquired pneumonia) 09/17/2021   Cellulitis of left lower extremity    Pressure injury of skin 06/03/2021   AKI (acute kidney injury) (HCC) 06/02/2021   Pain management    Cord compression (HCC)    Drug-induced constipation    Acute midline low back pain with sciatica    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) 08/30/2020   Urinary retention 12/04/2019   BPH (benign prostatic hyperplasia) 11/24/2019   CKD (chronic kidney disease), stage IV (HCC) 11/24/2019   Chronic pain syndrome 11/24/2019   Physical deconditioning 11/24/2019   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 [place], [place] - [address] [place] [address] Phone: [phone] Fax: [phone]     Social Determinants of Health (SDOH) Interventions    Readmission Risk Interventions    01/15/2022    2:56  PM 01/02/2022    2:03 PM 12/31/2021   10:32 AM  Readmission Risk Prevention Plan  Transportation Screening Complete Complete Complete  Medication Review Oceanographer) Complete Complete Complete  PCP or Specialist appointment within 3-5 days of discharge Complete Complete Complete  HRI or Home Care Consult Complete Complete Complete  SW Recovery Care/Counseling Consult Complete Complete Complete  Palliative Care Screening Not  Applicable Not Applicable Not Applicable  Skilled Nursing Facility Not Applicable Not Applicable Not Applicable

## 2024-01-05 NOTE — Progress Notes (Signed)
 [facility] Progress Note  Subjective:  Seen in ED room No new c/o's  Vitals:   01/05/24 0939 01/05/24 1423 01/05/24 1630 01/05/24 1700  BP: (!) 156/96 137/78 139/79 134/77  Pulse: 82  (!) 106 (!) 106  Resp: 16   18  Temp: 98.1 F (36.7 C)   98.9 F (37.2 C)  TempSrc:    Oral  SpO2: 100%  95% 93%    Exam: General: alert, NAD Lungs: Clear bilat to auscultation  Heart: RRR with normal S1, S2. Abdomen: Soft, non-tender,  with normoactive bowel sounds Neuro: Alert and oriented X 3. Moves all extremities spontaneously Psych:  normal affect MS: R BKA, trace woody edema L lower extremities HD access: RIJ TDC   OP HD: [place] [facility]  4h  B400   92kg  2K bath  RIJ TDC  Heparin none Mircera IV q 2 weeks- last dose Calcitriol 0.42mcg PO q HD Cefepime 2000mg  IV q HD- stop date 01/09/24 Daptomycin 700mg  IV q T/Th, 850mg  q Saturday. Stop date 01/09/24   Assessment/Plan:  Flank pain: Initially presented with flank pain, CT showed asymmetrical bladder thickening and chronic hydronephrosis. Plan for outpt urology follow up.   ESRD: TTS schedule, has not missed HD. Had HD here Sat. HD today Recent osteomyelitis: was to get IV cefepime/ dapto through 01/09/24 but RN reported worsening AMS assoc w/ giving antibiotics at home. Cefepime can cause encephalopathy so it was dc'd and IV ceftazidime is now being used in it's place.   Hypertension/volume: Respiratory status stable, some edema on exam. BP stable. UFG 2- 2.5 L as tolerated.    Anemia: Hgb 8.5, mircera last given 12/29/23, not due for next dose yet  Metabolic bone disease: Corrected calcium at goal, will check phos. Continue renvela  Nutrition:  Will need renal diet/fluid restrictions Dispo: Pt reports debility and difficulty caring for himself at home. Recent ED visits for AMS and pain. Appears plan is for SNF     Rob Arlean Hopping MD  [facility] 01/05/2024, 5:38 PM  Recent Labs  Lab 01/01/24 1615 01/01/24 1930  01/02/24 1349 01/03/24 1115 01/04/24 0602 01/05/24 0509  HGB  --    < > 9.4*   < > 8.9* 8.9*  ALBUMIN 3.1*  --  3.0*  --   --   --   CALCIUM 8.2*  --  8.1*  --   --  8.0*  PHOS  --   --  5.4*  --   --  6.1*  CREATININE 5.88*  --  7.34*  --   --  9.23*  K 4.3  --  4.4  --   --  4.3   < > = values in this interval not displayed.   No results for input(s): "IRON", "TIBC", "FERRITIN" in the last 168 hours. Inpatient medications:  amLODipine  5 mg Oral Daily   atorvastatin  80 mg Oral Daily   calcitRIOL  0.25 mcg Oral Daily   calcitRIOL  0.25 mcg Oral Q T,Th,Sat-1800   Chlorhexidine Gluconate Cloth  6 each Topical Q0600   Chlorhexidine Gluconate Cloth  6 each Topical Q0600   finasteride  5 mg Oral Daily   hydrALAZINE  100 mg Oral Q8H   levothyroxine  50 mcg Oral Q0600   oxyCODONE  30 mg Oral Q12H   tamsulosin  0.4 mg Oral Daily   Warfarin - Pharmacist Dosing Inpatient   Does not apply q1600    cefTAZidime (FORTAZ)  IV Stopped (01/05/24 1712)  DAPTOmycin Stopped (01/02/24 2021)   [START ON 01/07/2024] DAPTOmycin     ondansetron, oxyCODONE

## 2024-01-05 NOTE — Progress Notes (Addendum)
 Pt still does not have any bed offers at this time. CSW will follow up with [name]. CSW received notification from [facility] that they are unable to offer.   Addend @ 1:07PM CSW followed up with [name] who will review pt.

## 2024-01-05 NOTE — Progress Notes (Signed)
 Physical Therapy Treatment Patient Details Name: Thomas [name] MRN: [id number] DOB: October 10, 1951 Today's Date: 01/05/2024   History of Present Illness Pt is 73 year old presented to [facility] on  01/01/24 for lt flank pain. PMH - Spinal cord compression, rt BKA, ESRD on HD, DM, HTN, chronic pain, arthritis, back surgeries    PT Comments  The pt remains limited by pain, primarily in his neck and back this date. However, he was willing to participate and was able to progress to transferring OOB and ambulating up to ~16 ft with a RW and minA with his R prosthesis donned this date. His prosthesis has a loose fit and almost falls off when ambulating (has fallen off and resulted in at least x2 falls at home per pt) due to the liner being too large. Coordinated with MD, ortho techs, and [facility] to get pt a new liner for his prosthesis as the current one is too large. Provided pt with therabands to complete bed level exercises as able. Will continue to follow acutely.    If plan is discharge home, recommend the following: A lot of help with walking and/or transfers;A lot of help with bathing/dressing/bathroom;Assist for transportation;Help with stairs or ramp for entrance   Can travel by private vehicle     No  Equipment Recommendations  BSC/3in1    Recommendations for Other Services       Precautions / Restrictions Precautions Precautions: Fall Precaution/Restrictions Comments: R BKA (prosthesis in room, current liner is too large) Restrictions Weight Bearing Restrictions Per Provider Order: No     Mobility  Bed Mobility Overal bed mobility: Needs Assistance Bed Mobility: Supine to Sit, Sit to Supine     Supine to sit: HOB elevated, Min assist Sit to supine: Min assist, HOB elevated   General bed mobility comments: HOB elevated per pt request. Pt needing minA to lift his trunk to sit up L EOB then minA to manage his legs to swing them back onto the bed with return to supine.     Transfers Overall transfer level: Needs assistance Equipment used: Rolling walker (2 wheels) Transfers: Sit to/from Stand Sit to Stand: Min assist, From elevated surface           General transfer comment: Edge of stretcher elevated, minA needed to power up to stand and gain balance. R prosthesis donned for standing mobility    Ambulation/Gait Ambulation/Gait assistance: Min assist Gait Distance (Feet): 16 Feet Assistive device: Rolling walker (2 wheels) Gait Pattern/deviations: Step-through pattern, Decreased step length - right, Decreased stride length, Trunk flexed Gait velocity: reduced Gait velocity interpretation: <1.31 ft/sec, indicative of household ambulator   General Gait Details: Pt takes slow, small steps to the door and back. Pt limiting R step length and foot clearance due to fear of his prosthesis sliding off due to liner being too large. MinA needed for balance, RW management, and holding R prosthesis on   Stairs             Wheelchair Mobility     Tilt Bed    Modified Rankin (Stroke Patients Only)       Balance Overall balance assessment: Needs assistance Sitting-balance support: Feet supported, No upper extremity supported Sitting balance-[name]: Fair Sitting balance - Comments: Able to reach min off COG without LOB sitting EOB with CGA for safety   Standing balance support: Bilateral upper extremity supported, During functional activity, Reliant on assistive device for balance Standing balance-[name]: Poor Standing balance comment: reliant on  RW and R prosthesis                            Communication Communication Communication: Impaired Factors Affecting Communication: Reduced clarity of speech  Cognition Arousal: Alert Behavior During Therapy: WFL for tasks assessed/performed   PT - Cognitive impairments: Problem solving, Memory, Safety/Judgement                         Following commands:  Impaired Following commands impaired: Only follows one step commands consistently, Follows one step commands with increased time    Cueing Cueing Techniques: Verbal cues, Tactile cues  Exercises      General Comments General comments (skin integrity, edema, etc.): VSS on RA; provided pt with therabands per his request; coordinated with MD, ortho techs, and [facility] to get pt a new liner for his prosthesis as the current one is too large      Pertinent Vitals/Pain Pain Assessment Pain Assessment: 0-10 Pain Score: 10-Worst pain ever Pain Location: neck and back Pain Descriptors / Indicators: Discomfort, Grimacing, Guarding, Moaning Pain Intervention(s): Limited activity within patient's tolerance, Monitored during session, Repositioned, Patient requesting pain meds-RN notified, RN gave pain meds during session    Home Living                          Prior Function            PT Goals (current goals can now be found in the care plan section) Acute Rehab PT Goals Patient Stated Goal: go to rehab PT Goal Formulation: With patient Time For Goal Achievement: 01/16/24 Potential to Achieve Goals: Good Progress towards PT goals: Progressing toward goals    Frequency    Min 2X/week      PT Plan      Co-evaluation              AM-PAC PT "6 Clicks" Mobility   Outcome Measure  Help needed turning from your back to your side while in a flat bed without using bedrails?: A Little Help needed moving from lying on your back to sitting on the side of a flat bed without using bedrails?: A Little Help needed moving to and from a bed to a chair (including a wheelchair)?: A Little Help needed standing up from a chair using your arms (e.g., wheelchair or bedside chair)?: A Little Help needed to walk in hospital room?: Total (< 20 ft) Help needed climbing 3-5 steps with a railing? : Total 6 Click Score: 14    End of Session Equipment Utilized During Treatment:  Gait belt;Other (comment) (R prosthesis) Activity Tolerance: Patient limited by pain Patient left: in bed;with call bell/phone within reach Nurse Communication: Mobility status;Patient requests pain meds;Other (comment) (requests nausea meds) PT Visit Diagnosis: Unsteadiness on feet (R26.81);Other abnormalities of gait and mobility (R26.89);Muscle weakness (generalized) (M62.81);History of falling (Z91.81);Pain Pain - Right/Left: Right Pain - part of body: Arm;Hand;Shoulder (neck, back)     Time: 4098-1191 PT Time Calculation (min) (ACUTE ONLY): 49 min  Charges:    $Gait Training: 8-22 mins $Therapeutic Activity: 23-37 mins PT General Charges $$ ACUTE PT VISIT: 1 Visit                     Virgil Benedict, PT, DPT Acute Rehabilitation Services  Office: [phone]    Bettina Gavia 01/05/2024, 1:30 PM

## 2024-01-05 NOTE — Progress Notes (Signed)
 Orthopedic Tech Progress Note Patient Details:  KHALIEL MOREY 07-Sep-1951 [id number]  Called in order to [facility] for a BKA SMALLER LINER FOR A PROSTHESIS   Patient ID: TRAMAINE SAULS, male   DOB: 02-13-1951, 73 y.o.   MRN: [id number]  [name] 01/05/2024, 11:35 AM

## 2024-01-06 LAB — CBC
HCT: 28.2 % — ABNORMAL LOW (ref 39.0–52.0)
Hemoglobin: 8.9 g/dL — ABNORMAL LOW (ref 13.0–17.0)
MCH: 34.1 pg — ABNORMAL HIGH (ref 26.0–34.0)
MCHC: 31.6 g/dL (ref 30.0–36.0)
MCV: 108 fL — ABNORMAL HIGH (ref 80.0–100.0)
Platelets: 147 10*3/uL — ABNORMAL LOW (ref 150–400)
RBC: 2.61 MIL/uL — ABNORMAL LOW (ref 4.22–5.81)
RDW: 19 % — ABNORMAL HIGH (ref 11.5–15.5)
WBC: 6.1 10*3/uL (ref 4.0–10.5)
nRBC: 0 % (ref 0.0–0.2)

## 2024-01-06 LAB — PROTIME-INR
INR: 1.3 — ABNORMAL HIGH (ref 0.8–1.2)
Prothrombin Time: 16.6 s — ABNORMAL HIGH (ref 11.4–15.2)

## 2024-01-06 MED ORDER — ENOXAPARIN SODIUM 100 MG/ML IJ SOSY
90.0000 mg | PREFILLED_SYRINGE | Freq: Every day | INTRAMUSCULAR | Status: DC
  Administered 2024-01-06: 90 mg via SUBCUTANEOUS
  Filled 2024-01-06 (×2): qty 0.9

## 2024-01-06 MED ORDER — HEPARIN SODIUM (PORCINE) 1000 UNIT/ML IJ SOLN
4000.0000 [IU] | Freq: Once | INTRAMUSCULAR | Status: AC
  Administered 2024-01-06: 3200 [IU]
  Filled 2024-01-06: qty 4

## 2024-01-06 MED ORDER — WARFARIN SODIUM 7.5 MG PO TABS
15.0000 mg | ORAL_TABLET | Freq: Once | ORAL | Status: DC
  Filled 2024-01-06: qty 2

## 2024-01-06 NOTE — ED Notes (Signed)
Ptar called no eta

## 2024-01-06 NOTE — Progress Notes (Signed)
 Advised by CSW of pt's plan to return home today. Contacted [facility] to be advised that pt will d/c to home today and that pt should resume care tomorrow.   Olivia Canter Renal Navigator [phone]

## 2024-01-06 NOTE — Progress Notes (Signed)
 PHARMACY - ANTICOAGULATION CONSULT NOTE  Pharmacy Consult for PTA warfarin and therapeutic enoxaparin  Indication: history of pulmonary embolus  Vital Signs: Temp: 97.8 F (36.6 C) (03/19 0813) Temp Source: Oral (03/19 0430) BP: 142/91 (03/19 0823) Pulse Rate: 88 (03/19 0823)  Labs: Recent Labs    01/04/24 0602 01/05/24 0509 01/06/24 0430 01/06/24 1103  HGB 8.9* 8.9* 8.9*  --   HCT 28.0* 27.7* 28.2*  --   PLT 175 182 147*  --   LABPROT 15.4* 14.8  --  16.6*  INR 1.2 1.1  --  1.3*  CREATININE  --  9.23*  --   --   CKTOTAL 54  --   --   --     CrCl cannot be calculated (Unknown ideal weight.).  Medications:  -Warfarin 5mg  PO daily (Last dose unknown ~10 days ago, stated has stopped given high INR ~10 days ago); dose based on [facility] note from 2/18  Assessment: 75 yoM presented to ED with left flank pain with CT showing asymmetrical thickening of bladder. Pharmacy consulted to dose PTA warfarin for history of PE. History of Xarelto/eliquis and switched to warfarin for possible eliquis failure (2023) given DVT propagation. Pharmacy consulted for warfarin management and therapeutic enoxaparin while subtherapeutic on warfarin.   Date INR Warfarin Dose  3/15 1.1, subtherapeutic 10mg    3/16 1.2, subtherapeutic  10mg , enoxaparin 90mg  x 1  3/17 1.2, subtherapeutic 7.5mg    3/18 1.1, subtherapeutic 15 mg, enoxaparin 90mg        Patient given 4 doses of warfarin, without significant rise in INR. The home warfarin dose was 10mg  every day. Patient is not a reliable historian. Long term this patient may not be a good candidate for warfarin. It appears his last DVT/PE was several years ago. DOAC therapy may be optimal in this situation.   Goal of Therapy:  INR 2-3 Anti-Xa level 0.6-1 units/ml 4hrs after LMWH dose given Monitor platelets by anticoagulation protocol: Yes   Plan:  Will schedule enoxaparin 90mg  daily while INR subtherapeutic  Give warfarin 15mg  daily  Daily INR, CBC  and assessment bleeding F/u anti-xa level for enoxaparin adjustment  Thank you for allowing pharmacy to participate in this patient's care.  Ruben Im, PharmD Clinical Pharmacist 01/06/2024 3:12 PM Please check AMION for all [facility] Pharmacy numbers

## 2024-01-06 NOTE — ED Notes (Signed)
 Dialysis called and they are coming to get pt for dialysis at this time.

## 2024-01-06 NOTE — Procedures (Signed)
 Received patient in bed to unit.  Alert and oriented.  Informed consent signed and in chart.   TX duration: 3.5 hours  Patient tolerated well.  Transported back to the room  Alert, without acute distress.  Hand-off given to patient's nurse.   Access used: right cath Access issues: none  Total UF removed: 1.2 liters  Lu Duffel, RN [facility] D[facility]

## 2024-01-06 NOTE — Progress Notes (Signed)
 PHARMACY - ANTICOAGULATION CONSULT NOTE  Pharmacy Consult for PTA warfarin and therapeutic enoxaparin  Indication: history of pulmonary embolus  Vital Signs: Temp: 99.2 F (37.3 C) (03/18 1900) Temp Source: Oral (03/18 1700) BP: 134/73 (03/18 1930) Pulse Rate: 114 (03/18 1930)  Labs: Recent Labs    01/03/24 1115 01/04/24 0602 01/05/24 0509  HGB 9.5* 8.9* 8.9*  HCT 29.3* 28.0* 27.7*  PLT 209 175 182  LABPROT 15.1 15.4* 14.8  INR 1.2 1.2 1.1  CREATININE  --   --  9.23*  CKTOTAL  --  54  --     CrCl cannot be calculated (Unknown ideal weight.).  Medications:  -Warfarin 5mg  PO daily (Last dose unknown ~10 days ago, stated has stopped given high INR ~10 days ago); dose based on [facility] note from 2/18  Assessment: 15 yoM presented to ED with left flank pain with CT showing asymmetrical thickening of bladder. Pharmacy consulted to dose PTA warfarin for history of PE. History of Xarelto/eliquis and switched to warfarin for possible eliquis failure (2023) given DVT propagation. Pharmacy consulted for warfarin management and therapeutic enoxaparin while subtherapeutic on warfarin.   Date INR Warfarin Dose  3/15 1.1, subtherapeutic 10mg    3/16 1.2, subtherapeutic  10mg , enoxaparin 90mg  x 1  3/17 1.2, subtherapeutic 7.5mg    3/18 1.1, subtherapeutic 15 mg, enoxaparin 90mg        Patient given 3 doses of warfarin, without rise in INR. Today decided to reinvestigated the prior to admission warfarin dose and found pharmacy fill history for 10mg  every day. Patient is not a reliable historian. Long term this patient may not be a good candidate for warfarin. It appears his last DVT/PE was several years ago. DOAC therapy may be optimal in this situation.   Goal of Therapy:  INR 2-3 Anti-Xa level 0.6-1 units/ml 4hrs after LMWH dose given Monitor platelets by anticoagulation protocol: Yes   Plan:  Will schedule enoxaparin 90mg  daily while INR subtherapeutic  Will continue to follow for  daily warfarin dosing  Daily INR, CBC and assessment bleeding F/u anti-xa level for enoxaparin adjustment  Thank you for allowing pharmacy to participate in this patient's care.  Marja Kays, PharmD Emergency Medicine Clinical Pharmacist 01/06/2024,1:04 AM

## 2024-01-06 NOTE — ED Notes (Signed)
 Discharge

## 2024-01-06 NOTE — Progress Notes (Addendum)
 11:20am: CSW spoke with patient who states he does not want CSW to pursue SNF any further. Patient states he wants to return home today. Patient states he utilizes [facility] for HD transportation twice a week and uses a different service for transportation on Saturdays. Patient states he feels comfortable going home and can care for himself. Patient requesting to be discharged via [facility]. Patient states his home address is [address], [place]. Patient states he has his house keys to access his apartment.  CSW notified RN and MD of information.  CSW notified French Ana, renal navigator of discharge plan.  10:43am: CSW received return call from [facility] who states the facility cannot offer patient a bed at this time.  10:30am: Patient has not received any bed offers. CSW faxed patient's clinicals out to additional facilities in attempt to obtain a bed offer.  CSW spoke with Cordelia Pen at [facility] who states she will speak with administrator and return call to CSW regarding a possible bed offer.  Edwin Dada, MSW, LCSW Transitions of Care  Clinical Social Worker II [phone]

## 2024-01-06 NOTE — ED Provider Notes (Addendum)
 Patient no longer wants to go to facility.  He wants to go back home.  Cleared by social worker to go home.  Patient had dialysis yesterday next day for dialysis will be tomorrow.  Patient states that he rather continue his care at home he feels like he can do it.  Correction according to patient patient had dialysis today so the next time will be Saturday.  He did not have dialysis yesterday.   Vanetta Mulders, MD 01/06/24 1216    Vanetta Mulders, MD 01/06/24 1228

## 2024-01-06 NOTE — ED Notes (Signed)
 Pt transported to dialysis at this time.

## 2024-01-06 NOTE — Progress Notes (Signed)
 Orthopedic Tech Progress Note Patient Details:  Thomas [name] [birth year] [id number]  Reached out to RN to let her know product I had ordered was on the way but she never read the message, patient was discharged minutes before [facility] showed up with SMALLER LINER for prosthesis   Patient ID: Thomas [name], male   DOB: 09-21-1951, 73 y.o.   MRN: [id number]  [name] 01/06/2024, 4:00 PM

## 2024-01-06 NOTE — Progress Notes (Signed)
 Pt seen in room. Pt is going home. He will go  to his outpatient HD unit tomorrow (Thursday).  No other rec's. Will sign off.   Vinson Moselle  MD  [facility] 01/06/2024, 1:10 PM

## 2024-01-07 NOTE — Discharge Planning (Signed)
 [facility] Patient Discharge Orders- [facility] CLINIC: [facility]  Patient's name: Thomas [name] Admit/DC Dates: 01/01/2024 - 01/06/2024  Discharge Diagnoses: Flank pain - CT showed chronic hydronephrosis. Referred to Urology outpatient  Recent osteomyelitis - appears Dapto/Ceftazidime completed (do not see these listed on hospital summary)  Aranesp: Given: No     Last Hgb: 8.9 PRBC's Given: No ESA dose for discharge: Mircera 150 mcg IV q 2 weeks  IV Iron dose at discharge: N/A  Heparin change: No  EDW Change: No   Bath Change: No  Access intervention/Change: No  Calcitriol change: No  Discharge Labs: Calcium 8.0  Phosphorus 6.1 Albumin 3.0  K+ 4.3  IV Antibiotics: Yes Details: On PO Cephalexin 500mg  BID. Appears Dapto/Ceftazime have been completed for previous osteomyelitis.  On Coumadin?: Yes Last INR: 1.1 from 3/18 (sub) Next INR: Managed By:   OTHER/APPTS/LAB ORDERS:    D/C Meds to be reconciled by nurse after every discharge.  Completed By: Salome Holmes, NP   Reviewed by: MD:______ RN_______

## 2024-01-11 NOTE — ED Provider Notes (Signed)
  EMERGENCY DEPARTMENT AT [facility] Provider Note  CSN: [id number] Arrival date & time: 01/01/24 1418  Chief Complaint(s) Weakness  HPI Thomas [name] is a 73 y.o. male with PMH ESRD on hemodialysis, gout, HTN, previous spinal injury with persistent neck pain who presents emergency room for evaluation of flank pain and arm pain.  States that his right arm pain is chronic secondary to his previous injury to the neck.  However his flank pain is new and is associated with nausea but no vomiting.  Denies chest pain, shortness of breath, headache, fever or other systemic symptoms.   Past Medical History Past Medical History:  Diagnosis Date   Arthritis    BPH (benign prostatic hyperplasia) 11/24/2019   Chronic pain syndrome 11/24/2019   CKD (chronic kidney disease), stage IV (HCC) 11/24/2019   Diabetes mellitus without complication (HCC)    Gout    Hypertension    Muscle pain    Physical deconditioning 11/24/2019   Scars    Swelling    Patient Active Problem List   Diagnosis Date Noted   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   Open wound of left heel 01/14/2022   Acute renal failure superimposed on stage 4 chronic kidney disease (HCC) 01/13/2022   Chest pain 01/13/2022   UTI (urinary tract infection) 12/31/2021   Chronic anticoagulation 12/31/2021   Prolonged QT interval 12/31/2021   Multifocal pneumonia 12/30/2021   SVT (supraventricular tachycardia) (HCC) 12/11/2021   Obesity, Class I, BMI 30-34.9 12/10/2021   Left leg cellulitis 12/07/2021   Hyperkalemia 10/09/2021   Hypothyroidism 10/09/2021   Hydronephrosis of right kidney 10/09/2021   Hypoglycemia 10/07/2021   Deep vein thrombosis (DVT) of left lower extremity (HCC) 10/07/2021   Acute renal failure superimposed on chronic kidney disease (HCC) 09/17/2021   CAP (community acquired pneumonia) 09/17/2021   Cellulitis of left lower extremity    Pressure injury of skin 06/03/2021   AKI (acute kidney injury)  (HCC) 06/02/2021   Pain management    Cord compression (HCC)    Drug-induced constipation    Acute midline low back pain with sciatica    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) 08/30/2020   Urinary retention 12/04/2019   BPH (benign prostatic hyperplasia) 11/24/2019   CKD (chronic kidney disease), stage IV (HCC) 11/24/2019   Chronic pain syndrome 11/24/2019   Physical deconditioning 11/24/2019   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 Authorizing Provider  acetaminophen (TYLENOL) 650 MG CR tablet Take 1,300 mg by mouth every 8 (eight) hours as needed for pain.   Yes [provider]  amLODipine (NORVASC) 5 MG tablet Take 5 mg by mouth daily.   Yes [provider]  atorvastatin (LIPITOR) 80 MG tablet Take 80 mg by mouth daily.   Yes [provider]  bethanechol (URECHOLINE) 10 MG tablet Take 10 mg by mouth 2 (two) times daily. 12/21/23  Yes [provider]  cephALEXin (KEFLEX) 500 MG capsule Take 1 capsule (500 mg total) by mouth 2 (two) times daily. 01/01/24  Yes  Rolan Bucco, MD  Cholecalciferol 125 MCG (5000 UT) capsule Take 5,000 Units by mouth daily.   Yes [provider]  cloNIDine (CATAPRES) 0.1 MG tablet Take 0.1 mg by mouth 3 (three) times daily.   Yes [provider]  cyanocobalamin (VITAMIN B12) 1000 MCG tablet Take 1 tablet by mouth daily.   Yes [provider]  furosemide (LASIX) 40 MG tablet Take 40 mg by mouth daily.  11/26/23  Yes [provider]  hydrALAZINE (APRESOLINE) 25 MG tablet Take 25 mg by mouth 3 (three) times daily.   Yes [provider]  levothyroxine (SYNTHROID) 50 MCG tablet Take 50 mcg by mouth daily before breakfast.    Yes [provider]  losartan (COZAAR) 50 MG tablet Take 50 mg by mouth at bedtime. 11/11/23  Yes [provider]  magnesium oxide (MAG-OX) 400 MG tablet Take 800 mg by mouth in the morning, at noon, and at bedtime.   Yes [provider]  metoprolol tartrate (LOPRESSOR) 50 MG tablet Take 1 tablet (50 mg total) by mouth 2 (two) times daily. 12/24/21  Yes Rhetta Mura, MD  OXYCONTIN 30 MG 12 hr tablet Take 30 mg by mouth 3 (three) times daily as needed (pain). 12/30/21  Yes [provider]  senna-docusate (SENOKOT-S) 8.6-50 MG tablet Take 4 tablets by mouth 2 (two) times daily. 11/04/22  Yes [provider]  sevelamer (RENAGEL) 800 MG tablet Take 1,600 mg by mouth 3 (three) times daily. 08/27/23  Yes [provider]  tamsulosin (FLOMAX) 0.4 MG CAPS capsule Take 1 capsule (0.4 mg total) by mouth daily. 09/25/20  Yes Verdene Lennert, MD  tiZANidine (ZANAFLEX) 4 MG tablet Take 4 mg by mouth every 8 (eight) hours as needed for muscle spasms. 11/26/21  Yes [provider]  Vitamin E 670 MG (1000 UT) CAPS Take 1,000 mg by mouth daily.   Yes [provider]  pantoprazole (PROTONIX) 40 MG tablet Take 1 tablet by mouth 2 times daily. Patient not taking: Reported on 01/02/2024 10/01/21 01/01/25  Darlin Drop, DO  warfarin (COUMADIN) 10 MG tablet Take 10 mg by mouth daily.    [provider]  warfarin (COUMADIN) 5 MG tablet Take 0.5 tablets (2.5 mg total) by mouth daily. Patient not taking: Reported on 01/02/2024 01/03/22   Rhetta Mura, MD                                                                                                                                    Past Surgical  History Past Surgical History:  Procedure Laterality Date   BELOW KNEE LEG AMPUTATION Right    ESOPHAGOGASTRODUODENOSCOPY (EGD) WITH PROPOFOL N/A 11/25/2019   Procedure: ESOPHAGOGASTRODUODENOSCOPY (EGD) WITH PROPOFOL;  Surgeon: Jeani Hawking, MD;  Location: [facility];  Service: Endoscopy;  Laterality: N/A;   IR FLUORO GUIDE CV LINE RIGHT  09/05/2019   IR LUMBAR DISC ASPIRATION W/IMG GUIDE  11/26/2019  IR US GUIDE VASC ACCESS RIGHT  09/05/2019   RADIOLOGY WITH ANESTHESIA N/A 11/25/2019   Procedure: Thoracic and Lumbar MRI WITH ANESTHESIA;  Surgeon: Radiologist, Medication, MD;  Location: [facility] OR;  Service: Radiology;  Laterality: N/A;   Family History Family History  Problem Relation Age of Onset   Heart disease Mother    Cancer Father     Social History Social History   Tobacco Use   Smoking status: Never    Passive exposure: Never   Smokeless tobacco: Never  Vaping Use   Vaping status: Never Used  Substance Use Topics   Alcohol use: No   Drug use: No   Allergies Other, Oxymorphone, Amitriptyline, Aspirin, Beta vulgaris, Buspirone, Cabbage, Codeine, Cyclobenzaprine, Diflunisal, Fish allergy, Fish-derived products, Hydroxyzine, Levetiracetam, Methadone, Nalbuphine, Pentazocine, Propoxyphene, Shellfish allergy, Sulfa antibiotics, Sulfasalazine, and Amoxicillin  Review of Systems Review of Systems  Genitourinary:  Positive for flank pain.  Musculoskeletal:  Positive for arthralgias and myalgias.    Physical Exam Vital Signs  I have reviewed the triage vital signs BP (!) 142/91   Pulse 88   Temp 97.8 F (36.6 C)   Resp (!) 22   Wt 124 kg   SpO2 95%   BMI 32.42 kg/m   Physical Exam Constitutional:      General: He is not in acute distress.    Appearance: Normal appearance.  HENT:     Head: Normocephalic and atraumatic.     Nose: No congestion or rhinorrhea.  Eyes:     General:        Right eye: No discharge.        Left eye: No discharge.     Extraocular  Movements: Extraocular movements intact.     Pupils: Pupils are equal, round, and reactive to light.  Cardiovascular:     Rate and Rhythm: Normal rate and regular rhythm.     Heart sounds: No murmur heard. Pulmonary:     Effort: No respiratory distress.     Breath sounds: No wheezing or rales.  Abdominal:     General: There is no distension.     Tenderness: There is no abdominal tenderness. There is right CVA tenderness and left CVA tenderness.  Musculoskeletal:        General: Tenderness present. Normal range of motion.     Cervical back: Normal range of motion. Tenderness present.  Skin:    General: Skin is warm and dry.  Neurological:     General: No focal deficit present.     Mental Status: He is alert.     ED Results and Treatments Labs (all labs ordered are listed, but only abnormal results are displayed) Labs Reviewed  COMPREHENSIVE METABOLIC PANEL - Abnormal; Notable for the following components:      Result Value   Chloride 96 (*)    Creatinine, Ser 5.88 (*)    Calcium 8.2 (*)    Albumin 3.1 (*)    GFR, Estimated 10 (*)    All other components within normal limits  CBC WITH DIFFERENTIAL/PLATELET - Abnormal; Notable for the following components:   RBC 2.55 (*)    Hemoglobin 8.5 (*)    HCT 26.4 (*)    MCV 103.5 (*)    RDW 17.4 (*)    All other components within normal limits  HEPATITIS B SURFACE ANTIBODY, QUANTITATIVE - Abnormal; Notable for the following components:   Hep B S AB Quant (Post) 4.3 (*)    All other components within normal limits  CBC - Abnormal; Notable for the following components:   RBC 2.79 (*)    Hemoglobin 9.4 (*)    HCT 29.4 (*)    MCV 105.4 (*)    RDW 17.6 (*)    All other components within normal limits  RENAL FUNCTION PANEL - Abnormal; Notable for the following components:   Creatinine, Ser 7.34 (*)    Calcium 8.1 (*)    Phosphorus 5.4 (*)    Albumin 3.0 (*)    GFR, Estimated 7 (*)    All other components within normal limits   CBC - Abnormal; Notable for the following components:   RBC 2.77 (*)    Hemoglobin 9.5 (*)    HCT 29.3 (*)    MCV 105.8 (*)    MCH 34.3 (*)    RDW 18.4 (*)    All other components within normal limits  PROTIME-INR - Abnormal; Notable for the following components:   Prothrombin Time 15.4 (*)    All other components within normal limits  CBC - Abnormal; Notable for the following components:   RBC 2.66 (*)    Hemoglobin 8.9 (*)    HCT 28.0 (*)    MCV 105.3 (*)    RDW 18.0 (*)    All other components within normal limits  PHOSPHORUS - Abnormal; Notable for the following components:   Phosphorus 6.1 (*)    All other components within normal limits  CBC - Abnormal; Notable for the following components:   RBC 2.62 (*)    Hemoglobin 8.9 (*)    HCT 27.7 (*)    MCV 105.7 (*)    RDW 18.4 (*)    All other components within normal limits  BASIC METABOLIC PANEL - Abnormal; Notable for the following components:   Chloride 97 (*)    Glucose, Bld 102 (*)    BUN 48 (*)    Creatinine, Ser 9.23 (*)    Calcium 8.0 (*)    GFR, Estimated 6 (*)    All other components within normal limits  CBC - Abnormal; Notable for the following components:   RBC 2.61 (*)    Hemoglobin 8.9 (*)    HCT 28.2 (*)    MCV 108.0 (*)    MCH 34.1 (*)    RDW 19.0 (*)    Platelets 147 (*)    All other components within normal limits  PROTIME-INR - Abnormal; Notable for the following components:   Prothrombin Time 16.6 (*)    INR 1.3 (*)    All other components within normal limits  PROTIME-INR  HEPATITIS B SURFACE ANTIGEN  PROTIME-INR  CK  PROTIME-INR                                                                                                                          Radiology No results found.  Pertinent labs & imaging results that were available during my care of the patient were reviewed by me and considered in my  medical decision making (see MDM for details).  Medications Ordered in  ED Medications  HYDROmorphone (DILAUDID) injection 1 mg (1 mg Intravenous Given 01/01/24 1651)  ondansetron (ZOFRAN) injection 4 mg (4 mg Intravenous Given 01/01/24 1656)  oxyCODONE-acetaminophen (PERCOCET/ROXICET) 5-325 MG per tablet 2 tablet (2 tablets Oral Given 01/01/24 2036)  warfarin (COUMADIN) tablet 10 mg (10 mg Oral Given 01/02/24 0237)  DAPTOmycin (CUBICIN) IVPB 700 mg/157mL premix (0 mg Intravenous Stopped 01/05/24 1430)  heparin sodium (porcine) injection 3,200 Units (3,200 Units Intracatheter Given 01/02/24 1805)  HYDROmorphone (DILAUDID) injection 1 mg (1 mg Intravenous Given 01/03/24 1154)  warfarin (COUMADIN) tablet 10 mg (10 mg Oral Given 01/03/24 1539)  enoxaparin (LOVENOX) injection 90 mg (90 mg Subcutaneous Given 01/03/24 1421)  bisacodyl (DULCOLAX) suppository 10 mg (10 mg Rectal Given 01/03/24 1805)  docusate sodium (COLACE) capsule 100 mg (100 mg Oral Given 01/03/24 1804)  HYDROmorphone (DILAUDID) injection 1 mg (1 mg Intravenous Given 01/03/24 2315)  warfarin (COUMADIN) tablet 7.5 mg (7.5 mg Oral Given 01/04/24 1601)  warfarin (COUMADIN) tablet 15 mg (15 mg Oral Given 01/05/24 1631)  heparin sodium (porcine) injection 4,000 Units (3,200 Units Intracatheter Given 01/06/24 0817)                                                                                                                                     Procedures Procedures  (including critical care time)  Medical Decision Making / ED Course   This patient presents to the ED for concern of flank pain, this involves an extensive number of treatment options, and is a complaint that carries with it a high risk of complications and morbidity.  The differential diagnosis includes nephrolithiasis, pyelonephritis, obstruction, AAA, musculoskeletal strain, vertebral fracture, intra-abdominal abscess, diverticulitis  MDM: Patient seen emergency room for evaluation of arm pain and flank pain.  Physical exam with bilateral CVA  tenderness and tenderness at the shoulder when the right arm is ranged.  Significant difficulty obtaining laboratory evaluation based on his vasculopathy and at time of signout is pending imaging studies and laboratory evaluation.  Please see provider signout note for continuation of workup.   Additional history obtained:  -External records from outside source obtained and reviewed including: Chart review including previous notes, labs, imaging, consultation notes   Lab Tests: -I ordered, reviewed, and interpreted labs.   The pertinent results include:   Labs Reviewed  COMPREHENSIVE METABOLIC PANEL - Abnormal; Notable for the following components:      Result Value   Chloride 96 (*)    Creatinine, Ser 5.88 (*)    Calcium 8.2 (*)    Albumin 3.1 (*)    GFR, Estimated 10 (*)    All other components within normal limits  CBC WITH DIFFERENTIAL/PLATELET - Abnormal; Notable for the following components:   RBC 2.55 (*)    Hemoglobin 8.5 (*)    HCT 26.4 (*)    MCV 103.5 (*)  RDW 17.4 (*)    All other components within normal limits  HEPATITIS B SURFACE ANTIBODY, QUANTITATIVE - Abnormal; Notable for the following components:   Hep B S AB Quant (Post) 4.3 (*)    All other components within normal limits  CBC - Abnormal; Notable for the following components:   RBC 2.79 (*)    Hemoglobin 9.4 (*)    HCT 29.4 (*)    MCV 105.4 (*)    RDW 17.6 (*)    All other components within normal limits  RENAL FUNCTION PANEL - Abnormal; Notable for the following components:   Creatinine, Ser 7.34 (*)    Calcium 8.1 (*)    Phosphorus 5.4 (*)    Albumin 3.0 (*)    GFR, Estimated 7 (*)    All other components within normal limits  CBC - Abnormal; Notable for the following components:   RBC 2.77 (*)    Hemoglobin 9.5 (*)    HCT 29.3 (*)    MCV 105.8 (*)    MCH 34.3 (*)    RDW 18.4 (*)    All other components within normal limits  PROTIME-INR - Abnormal; Notable for the following components:    Prothrombin Time 15.4 (*)    All other components within normal limits  CBC - Abnormal; Notable for the following components:   RBC 2.66 (*)    Hemoglobin 8.9 (*)    HCT 28.0 (*)    MCV 105.3 (*)    RDW 18.0 (*)    All other components within normal limits  PHOSPHORUS - Abnormal; Notable for the following components:   Phosphorus 6.1 (*)    All other components within normal limits  CBC - Abnormal; Notable for the following components:   RBC 2.62 (*)    Hemoglobin 8.9 (*)    HCT 27.7 (*)    MCV 105.7 (*)    RDW 18.4 (*)    All other components within normal limits  BASIC METABOLIC PANEL - Abnormal; Notable for the following components:   Chloride 97 (*)    Glucose, Bld 102 (*)    BUN 48 (*)    Creatinine, Ser 9.23 (*)    Calcium 8.0 (*)    GFR, Estimated 6 (*)    All other components within normal limits  CBC - Abnormal; Notable for the following components:   RBC 2.61 (*)    Hemoglobin 8.9 (*)    HCT 28.2 (*)    MCV 108.0 (*)    MCH 34.1 (*)    RDW 19.0 (*)    Platelets 147 (*)    All other components within normal limits  PROTIME-INR - Abnormal; Notable for the following components:   Prothrombin Time 16.6 (*)    INR 1.3 (*)    All other components within normal limits  PROTIME-INR  HEPATITIS B SURFACE ANTIGEN  PROTIME-INR  CK  PROTIME-INR      EKG   EKG Interpretation Date/Time:  Friday January 01 2024 14:40:25 EDT Ventricular Rate:  77 PR Interval:  208 QRS Duration:  103 QT Interval:  487 QTC Calculation: 495 R Axis:   -11  Text Interpretation: Sinus rhythm Multiple ventricular premature complexes Borderline prolonged QT interval Confirmed by Rolan Bucco [phone]) on 01/01/2024 5:08:38 PM         Imaging Studies ordered: I ordered imaging studies including CT stone study and this is pending   Medicines ordered and prescription drug management: Meds ordered this encounter  Medications  HYDROmorphone (DILAUDID) injection 1 mg   ondansetron  (ZOFRAN) injection 4 mg   oxyCODONE-acetaminophen (PERCOCET/ROXICET) 5-325 MG per tablet 2 tablet    Refill:  0   cephALEXin (KEFLEX) 500 MG capsule    Sig: Take 1 capsule (500 mg total) by mouth 2 (two) times daily.    Dispense:  14 capsule    Refill:  0   DISCONTD: atorvastatin (LIPITOR) tablet 80 mg   DISCONTD: calcitRIOL (ROCALTROL) capsule 0.25 mcg   DISCONTD: finasteride (PROSCAR) tablet 5 mg   DISCONTD: tamsulosin (FLOMAX) capsule 0.4 mg   DISCONTD: levothyroxine (SYNTHROID) tablet 50 mcg   DISCONTD: warfarin (COUMADIN) tablet 7.5 mg   DISCONTD: amLODipine (NORVASC) tablet 5 mg   DISCONTD: cloNIDine (CATAPRES) tablet 0.1 mg   DISCONTD: hydrALAZINE (APRESOLINE) tablet 100 mg   DISCONTD: oxyCODONE (OXYCONTIN) 12 hr tablet 30 mg    Refill:  0   warfarin (COUMADIN) tablet 10 mg   DISCONTD: Warfarin - Pharmacist Dosing Inpatient   DISCONTD: Chlorhexidine Gluconate Cloth 2 % PADS 6 each   DISCONTD: pentafluoroprop-tetrafluoroeth (GEBAUERS) aerosol 1 Application   DISCONTD: lidocaine (PF) (XYLOCAINE) 1 % injection 5 mL   DISCONTD: lidocaine-prilocaine (EMLA) cream 1 Application   DISCONTD: heparin injection 1,000 Units   DISCONTD: anticoagulant sodium citrate solution 5 mL   DISCONTD: alteplase (CATHFLO ACTIVASE) injection 2 mg   DAPTOmycin (CUBICIN) IVPB 700 mg/161mL premix    My specialty::   Nephrologist    Antibiotic Indication::   Other Indication (list below)   DISCONTD: DAPTOmycin (CUBICIN) IVPB 700 mg/145mL premix    My specialty::   Nephrologist    Antibiotic Indication::   Other Indication (list below)    Other Indication::   Osteomyelitis   DISCONTD: cefTAZidime (FORTAZ) 1 g in sodium chloride 0.9 % 100 mL IVPB    Antibiotic Indication::   Osteomyelitis   DISCONTD: DAPTOmycin (CUBICIN) 900 mg in sodium chloride 0.9 % IVPB    My specialty::   Nephrologist    Antibiotic Indication::   Other Indication (list below)   heparin sodium (porcine) injection 3,200 Units    HYDROmorphone (DILAUDID) injection 1 mg   warfarin (COUMADIN) tablet 10 mg   enoxaparin (LOVENOX) injection 90 mg   bisacodyl (DULCOLAX) suppository 10 mg   docusate sodium (COLACE) capsule 100 mg   HYDROmorphone (DILAUDID) injection 1 mg   warfarin (COUMADIN) tablet 7.5 mg   DISCONTD: calcitRIOL (ROCALTROL) capsule 0.25 mcg   DISCONTD: Chlorhexidine Gluconate Cloth 2 % PADS 6 each   DISCONTD: oxyCODONE (Oxy IR/ROXICODONE) immediate release tablet 10 mg    Refill:  0   DISCONTD: ondansetron (ZOFRAN-ODT) disintegrating tablet 4 mg   warfarin (COUMADIN) tablet 15 mg   DISCONTD: enoxaparin (LOVENOX) injection 90 mg   heparin sodium (porcine) injection 4,000 Units   DISCONTD: warfarin (COUMADIN) tablet 15 mg    -I have reviewed the patients home medicines and have made adjustments as needed  Critical interventions none   Cardiac Monitoring: The patient was maintained on a cardiac monitor.  I personally viewed and interpreted the cardiac monitored which showed an underlying rhythm of: NSR  Social Determinants of Health:  Factors impacting patients care include: none   Reevaluation: After the interventions noted above, I reevaluated the patient and found that they have :stayed the same  Co morbidities that complicate the patient evaluation  Past Medical History:  Diagnosis Date   Arthritis    BPH (benign prostatic hyperplasia) 11/24/2019   Chronic pain syndrome 11/24/2019   CKD (chronic  kidney disease), stage IV (HCC) 11/24/2019   Diabetes mellitus without complication (HCC)    Gout    Hypertension    Muscle pain    Physical deconditioning 11/24/2019   Scars    Swelling       Dispostion: I considered admission for this patient, and disposition pending completion of imaging studies and laboratory valuation.  Please see provider signout note for continuation of workup.     Final Clinical Impression(s) / ED Diagnoses Final diagnoses:  Left flank pain  ESRD needing dialysis  [facility])     @PCDICTATION @    Glendora Score, MD 01/11/24 1725

## 2024-02-07 ENCOUNTER — Inpatient Hospital Stay (HOSPITAL_COMMUNITY)
Admission: EM | Admit: 2024-02-07 | Discharge: 2024-03-16 | DRG: 616 | Disposition: A | Discharge status: Skilled Nursing Facility | Attending: Internal Medicine | Admitting: Internal Medicine

## 2024-02-07 ENCOUNTER — Encounter (HOSPITAL_COMMUNITY): Payer: Self-pay

## 2024-02-07 DIAGNOSIS — Z9181 History of falling: Secondary | ICD-10-CM

## 2024-02-07 DIAGNOSIS — E1122 Type 2 diabetes mellitus with diabetic chronic kidney disease: Secondary | ICD-10-CM | POA: Diagnosis present

## 2024-02-07 DIAGNOSIS — R571 Hypovolemic shock: Secondary | ICD-10-CM | POA: Diagnosis not present

## 2024-02-07 DIAGNOSIS — I872 Venous insufficiency (chronic) (peripheral): Secondary | ICD-10-CM | POA: Diagnosis present

## 2024-02-07 DIAGNOSIS — Z765 Malingerer [conscious simulation]: Secondary | ICD-10-CM

## 2024-02-07 DIAGNOSIS — D6959 Other secondary thrombocytopenia: Secondary | ICD-10-CM | POA: Diagnosis not present

## 2024-02-07 DIAGNOSIS — W19XXXA Unspecified fall, initial encounter: Principal | ICD-10-CM | POA: Insufficient documentation

## 2024-02-07 DIAGNOSIS — Z862 Personal history of diseases of the blood and blood-forming organs and certain disorders involving the immune mechanism: Secondary | ICD-10-CM

## 2024-02-07 DIAGNOSIS — Z992 Dependence on renal dialysis: Secondary | ICD-10-CM

## 2024-02-07 DIAGNOSIS — Z86711 Personal history of pulmonary embolism: Secondary | ICD-10-CM

## 2024-02-07 DIAGNOSIS — F1120 Opioid dependence, uncomplicated: Secondary | ICD-10-CM | POA: Diagnosis present

## 2024-02-07 DIAGNOSIS — I471 Supraventricular tachycardia, unspecified: Secondary | ICD-10-CM | POA: Diagnosis not present

## 2024-02-07 DIAGNOSIS — R338 Other retention of urine: Secondary | ICD-10-CM | POA: Diagnosis present

## 2024-02-07 DIAGNOSIS — E860 Dehydration: Secondary | ICD-10-CM | POA: Diagnosis not present

## 2024-02-07 DIAGNOSIS — N250 Renal osteodystrophy: Secondary | ICD-10-CM | POA: Diagnosis present

## 2024-02-07 DIAGNOSIS — D631 Anemia in chronic kidney disease: Secondary | ICD-10-CM | POA: Diagnosis present

## 2024-02-07 DIAGNOSIS — E875 Hyperkalemia: Secondary | ICD-10-CM | POA: Diagnosis not present

## 2024-02-07 DIAGNOSIS — I132 Hypertensive heart and chronic kidney disease with heart failure and with stage 5 chronic kidney disease, or end stage renal disease: Secondary | ICD-10-CM | POA: Diagnosis present

## 2024-02-07 DIAGNOSIS — M5030 Other cervical disc degeneration, unspecified cervical region: Secondary | ICD-10-CM | POA: Diagnosis present

## 2024-02-07 DIAGNOSIS — I9589 Other hypotension: Secondary | ICD-10-CM | POA: Diagnosis not present

## 2024-02-07 DIAGNOSIS — I10 Essential (primary) hypertension: Secondary | ICD-10-CM | POA: Diagnosis present

## 2024-02-07 DIAGNOSIS — I5042 Chronic combined systolic (congestive) and diastolic (congestive) heart failure: Secondary | ICD-10-CM | POA: Diagnosis present

## 2024-02-07 DIAGNOSIS — K5909 Other constipation: Secondary | ICD-10-CM | POA: Diagnosis present

## 2024-02-07 DIAGNOSIS — Z89611 Acquired absence of right leg above knee: Secondary | ICD-10-CM

## 2024-02-07 DIAGNOSIS — Z981 Arthrodesis status: Secondary | ICD-10-CM

## 2024-02-07 DIAGNOSIS — E1165 Type 2 diabetes mellitus with hyperglycemia: Secondary | ICD-10-CM | POA: Diagnosis not present

## 2024-02-07 DIAGNOSIS — E11621 Type 2 diabetes mellitus with foot ulcer: Secondary | ICD-10-CM | POA: Diagnosis present

## 2024-02-07 DIAGNOSIS — E119 Type 2 diabetes mellitus without complications: Secondary | ICD-10-CM

## 2024-02-07 DIAGNOSIS — E11610 Type 2 diabetes mellitus with diabetic neuropathic arthropathy: Secondary | ICD-10-CM | POA: Diagnosis present

## 2024-02-07 DIAGNOSIS — M545 Low back pain, unspecified: Secondary | ICD-10-CM | POA: Diagnosis present

## 2024-02-07 DIAGNOSIS — E8809 Other disorders of plasma-protein metabolism, not elsewhere classified: Secondary | ICD-10-CM | POA: Diagnosis present

## 2024-02-07 DIAGNOSIS — Z604 Social exclusion and rejection: Secondary | ICD-10-CM | POA: Diagnosis present

## 2024-02-07 DIAGNOSIS — E440 Moderate protein-calorie malnutrition: Secondary | ICD-10-CM | POA: Insufficient documentation

## 2024-02-07 DIAGNOSIS — Z89512 Acquired absence of left leg below knee: Secondary | ICD-10-CM

## 2024-02-07 DIAGNOSIS — T40605A Adverse effect of unspecified narcotics, initial encounter: Secondary | ICD-10-CM | POA: Diagnosis not present

## 2024-02-07 DIAGNOSIS — E1142 Type 2 diabetes mellitus with diabetic polyneuropathy: Secondary | ICD-10-CM | POA: Diagnosis present

## 2024-02-07 DIAGNOSIS — Z79899 Other long term (current) drug therapy: Secondary | ICD-10-CM

## 2024-02-07 DIAGNOSIS — M4644 Discitis, unspecified, thoracic region: Secondary | ICD-10-CM | POA: Diagnosis present

## 2024-02-07 DIAGNOSIS — W010XXA Fall on same level from slipping, tripping and stumbling without subsequent striking against object, initial encounter: Secondary | ICD-10-CM | POA: Diagnosis present

## 2024-02-07 DIAGNOSIS — Z8249 Family history of ischemic heart disease and other diseases of the circulatory system: Secondary | ICD-10-CM

## 2024-02-07 DIAGNOSIS — Z79890 Hormone replacement therapy (postmenopausal): Secondary | ICD-10-CM

## 2024-02-07 DIAGNOSIS — I959 Hypotension, unspecified: Secondary | ICD-10-CM | POA: Diagnosis not present

## 2024-02-07 DIAGNOSIS — Z7901 Long term (current) use of anticoagulants: Secondary | ICD-10-CM

## 2024-02-07 DIAGNOSIS — Z91013 Allergy to seafood: Secondary | ICD-10-CM

## 2024-02-07 DIAGNOSIS — Z882 Allergy status to sulfonamides status: Secondary | ICD-10-CM

## 2024-02-07 DIAGNOSIS — E785 Hyperlipidemia, unspecified: Secondary | ICD-10-CM | POA: Diagnosis present

## 2024-02-07 DIAGNOSIS — N186 End stage renal disease: Secondary | ICD-10-CM

## 2024-02-07 DIAGNOSIS — Z86718 Personal history of other venous thrombosis and embolism: Secondary | ICD-10-CM

## 2024-02-07 DIAGNOSIS — E1169 Type 2 diabetes mellitus with other specified complication: Secondary | ICD-10-CM | POA: Diagnosis not present

## 2024-02-07 DIAGNOSIS — N189 Chronic kidney disease, unspecified: Secondary | ICD-10-CM

## 2024-02-07 DIAGNOSIS — G894 Chronic pain syndrome: Secondary | ICD-10-CM | POA: Diagnosis present

## 2024-02-07 DIAGNOSIS — M40209 Unspecified kyphosis, site unspecified: Secondary | ICD-10-CM | POA: Diagnosis present

## 2024-02-07 DIAGNOSIS — Z888 Allergy status to other drugs, medicaments and biological substances status: Secondary | ICD-10-CM

## 2024-02-07 DIAGNOSIS — M546 Pain in thoracic spine: Secondary | ICD-10-CM

## 2024-02-07 DIAGNOSIS — F40240 Claustrophobia: Secondary | ICD-10-CM | POA: Diagnosis not present

## 2024-02-07 DIAGNOSIS — Z95828 Presence of other vascular implants and grafts: Secondary | ICD-10-CM

## 2024-02-07 DIAGNOSIS — M898X9 Other specified disorders of bone, unspecified site: Secondary | ICD-10-CM | POA: Diagnosis present

## 2024-02-07 DIAGNOSIS — E039 Hypothyroidism, unspecified: Secondary | ICD-10-CM | POA: Diagnosis present

## 2024-02-07 DIAGNOSIS — Z880 Allergy status to penicillin: Secondary | ICD-10-CM

## 2024-02-07 DIAGNOSIS — N2581 Secondary hyperparathyroidism of renal origin: Secondary | ICD-10-CM | POA: Diagnosis present

## 2024-02-07 DIAGNOSIS — Y92009 Unspecified place in unspecified non-institutional (private) residence as the place of occurrence of the external cause: Secondary | ICD-10-CM

## 2024-02-07 DIAGNOSIS — Z885 Allergy status to narcotic agent status: Secondary | ICD-10-CM

## 2024-02-07 DIAGNOSIS — M86272 Subacute osteomyelitis, left ankle and foot: Secondary | ICD-10-CM

## 2024-02-07 DIAGNOSIS — N401 Enlarged prostate with lower urinary tract symptoms: Secondary | ICD-10-CM | POA: Diagnosis present

## 2024-02-07 DIAGNOSIS — T3695XA Adverse effect of unspecified systemic antibiotic, initial encounter: Secondary | ICD-10-CM | POA: Diagnosis not present

## 2024-02-07 DIAGNOSIS — M1A9XX0 Chronic gout, unspecified, without tophus (tophi): Secondary | ICD-10-CM | POA: Diagnosis present

## 2024-02-07 DIAGNOSIS — F419 Anxiety disorder, unspecified: Secondary | ICD-10-CM | POA: Diagnosis not present

## 2024-02-07 DIAGNOSIS — L97529 Non-pressure chronic ulcer of other part of left foot with unspecified severity: Secondary | ICD-10-CM | POA: Diagnosis present

## 2024-02-07 DIAGNOSIS — N1330 Unspecified hydronephrosis: Secondary | ICD-10-CM | POA: Diagnosis present

## 2024-02-07 DIAGNOSIS — M4624 Osteomyelitis of vertebra, thoracic region: Secondary | ICD-10-CM | POA: Diagnosis present

## 2024-02-07 DIAGNOSIS — R001 Bradycardia, unspecified: Secondary | ICD-10-CM | POA: Diagnosis present

## 2024-02-07 DIAGNOSIS — K5903 Drug induced constipation: Secondary | ICD-10-CM | POA: Diagnosis not present

## 2024-02-07 HISTORY — DX: End stage renal disease: N18.6

## 2024-02-07 MED ORDER — OXYCODONE-ACETAMINOPHEN 5-325 MG PO TABS
1.0000 | ORAL_TABLET | Freq: Once | ORAL | Status: AC
  Administered 2024-02-07: 1 via ORAL
  Filled 2024-02-07: qty 1

## 2024-02-07 NOTE — ED Provider Triage Note (Signed)
 Emergency Medicine Provider Triage Evaluation Note  Thomas [name] , a 73 y.o. male  was evaluated in triage.  Pt complains of neck, back, headache secondary to mechanical fall this afternoon.  Patient states he caught his prosthetic leg when working which made him fall down. He denies blood thinner usage  Review of Systems  Positive:  Negative:   Physical Exam  BP (!) 171/96   Pulse 64   Temp 98.4 F (36.9 C) (Oral)   Resp 18   SpO2 100%  Gen:   Awake, no distress   Resp:  Normal effort  MSK:   Moves extremities without difficulty  Other:    Medical Decision Making  Medically screening exam initiated at 11:40 PM.  Appropriate orders placed.  Marlane Silver was informed that the remainder of the evaluation will be completed by another provider, this initial triage assessment does not replace that evaluation, and the importance of remaining in the ED until their evaluation is complete.     Elisa Guest, [place] 02/07/24 2341

## 2024-02-07 NOTE — ED Triage Notes (Signed)
 Pt comes via [facility] from home, pt had a fall around 1pm, was able to crawl to his recliner, pt not having neck and back pain. Hx of neck surgery

## 2024-02-08 ENCOUNTER — Emergency Department (HOSPITAL_COMMUNITY)

## 2024-02-08 ENCOUNTER — Other Ambulatory Visit (HOSPITAL_COMMUNITY)

## 2024-02-08 ENCOUNTER — Other Ambulatory Visit: Payer: Self-pay

## 2024-02-08 DIAGNOSIS — L97529 Non-pressure chronic ulcer of other part of left foot with unspecified severity: Secondary | ICD-10-CM | POA: Diagnosis present

## 2024-02-08 DIAGNOSIS — E1169 Type 2 diabetes mellitus with other specified complication: Secondary | ICD-10-CM | POA: Diagnosis present

## 2024-02-08 DIAGNOSIS — I10 Essential (primary) hypertension: Secondary | ICD-10-CM

## 2024-02-08 DIAGNOSIS — Z992 Dependence on renal dialysis: Secondary | ICD-10-CM | POA: Diagnosis not present

## 2024-02-08 DIAGNOSIS — Z862 Personal history of diseases of the blood and blood-forming organs and certain disorders involving the immune mechanism: Secondary | ICD-10-CM

## 2024-02-08 DIAGNOSIS — E1122 Type 2 diabetes mellitus with diabetic chronic kidney disease: Secondary | ICD-10-CM | POA: Diagnosis present

## 2024-02-08 DIAGNOSIS — I120 Hypertensive chronic kidney disease with stage 5 chronic kidney disease or end stage renal disease: Secondary | ICD-10-CM | POA: Diagnosis not present

## 2024-02-08 DIAGNOSIS — E119 Type 2 diabetes mellitus without complications: Secondary | ICD-10-CM

## 2024-02-08 DIAGNOSIS — L98499 Non-pressure chronic ulcer of skin of other sites with unspecified severity: Secondary | ICD-10-CM | POA: Diagnosis not present

## 2024-02-08 DIAGNOSIS — W19XXXA Unspecified fall, initial encounter: Secondary | ICD-10-CM

## 2024-02-08 DIAGNOSIS — D6959 Other secondary thrombocytopenia: Secondary | ICD-10-CM | POA: Diagnosis not present

## 2024-02-08 DIAGNOSIS — I132 Hypertensive heart and chronic kidney disease with heart failure and with stage 5 chronic kidney disease, or end stage renal disease: Secondary | ICD-10-CM | POA: Diagnosis present

## 2024-02-08 DIAGNOSIS — E11610 Type 2 diabetes mellitus with diabetic neuropathic arthropathy: Secondary | ICD-10-CM | POA: Diagnosis present

## 2024-02-08 DIAGNOSIS — E860 Dehydration: Secondary | ICD-10-CM | POA: Diagnosis not present

## 2024-02-08 DIAGNOSIS — I471 Supraventricular tachycardia, unspecified: Secondary | ICD-10-CM | POA: Diagnosis not present

## 2024-02-08 DIAGNOSIS — N186 End stage renal disease: Secondary | ICD-10-CM | POA: Diagnosis present

## 2024-02-08 DIAGNOSIS — E8809 Other disorders of plasma-protein metabolism, not elsewhere classified: Secondary | ICD-10-CM | POA: Diagnosis present

## 2024-02-08 DIAGNOSIS — M546 Pain in thoracic spine: Secondary | ICD-10-CM | POA: Diagnosis not present

## 2024-02-08 DIAGNOSIS — W010XXA Fall on same level from slipping, tripping and stumbling without subsequent striking against object, initial encounter: Secondary | ICD-10-CM | POA: Diagnosis present

## 2024-02-08 DIAGNOSIS — D631 Anemia in chronic kidney disease: Secondary | ICD-10-CM | POA: Diagnosis present

## 2024-02-08 DIAGNOSIS — I9589 Other hypotension: Secondary | ICD-10-CM | POA: Diagnosis not present

## 2024-02-08 DIAGNOSIS — N1330 Unspecified hydronephrosis: Secondary | ICD-10-CM | POA: Diagnosis present

## 2024-02-08 DIAGNOSIS — E1142 Type 2 diabetes mellitus with diabetic polyneuropathy: Secondary | ICD-10-CM | POA: Diagnosis present

## 2024-02-08 DIAGNOSIS — E039 Hypothyroidism, unspecified: Secondary | ICD-10-CM

## 2024-02-08 DIAGNOSIS — E1165 Type 2 diabetes mellitus with hyperglycemia: Secondary | ICD-10-CM | POA: Diagnosis not present

## 2024-02-08 DIAGNOSIS — E11621 Type 2 diabetes mellitus with foot ulcer: Secondary | ICD-10-CM | POA: Diagnosis present

## 2024-02-08 DIAGNOSIS — Z89512 Acquired absence of left leg below knee: Secondary | ICD-10-CM | POA: Diagnosis not present

## 2024-02-08 DIAGNOSIS — F1120 Opioid dependence, uncomplicated: Secondary | ICD-10-CM | POA: Diagnosis present

## 2024-02-08 DIAGNOSIS — N189 Chronic kidney disease, unspecified: Secondary | ICD-10-CM

## 2024-02-08 DIAGNOSIS — I5042 Chronic combined systolic (congestive) and diastolic (congestive) heart failure: Secondary | ICD-10-CM | POA: Diagnosis present

## 2024-02-08 DIAGNOSIS — M4624 Osteomyelitis of vertebra, thoracic region: Secondary | ICD-10-CM | POA: Diagnosis present

## 2024-02-08 DIAGNOSIS — M545 Low back pain, unspecified: Secondary | ICD-10-CM | POA: Diagnosis present

## 2024-02-08 DIAGNOSIS — M86272 Subacute osteomyelitis, left ankle and foot: Secondary | ICD-10-CM | POA: Diagnosis not present

## 2024-02-08 DIAGNOSIS — R571 Hypovolemic shock: Secondary | ICD-10-CM | POA: Diagnosis not present

## 2024-02-08 DIAGNOSIS — Z89611 Acquired absence of right leg above knee: Secondary | ICD-10-CM | POA: Diagnosis not present

## 2024-02-08 DIAGNOSIS — Z95828 Presence of other vascular implants and grafts: Secondary | ICD-10-CM | POA: Diagnosis not present

## 2024-02-08 DIAGNOSIS — M869 Osteomyelitis, unspecified: Secondary | ICD-10-CM | POA: Diagnosis not present

## 2024-02-08 DIAGNOSIS — R570 Cardiogenic shock: Secondary | ICD-10-CM | POA: Diagnosis not present

## 2024-02-08 DIAGNOSIS — Y92009 Unspecified place in unspecified non-institutional (private) residence as the place of occurrence of the external cause: Secondary | ICD-10-CM | POA: Diagnosis not present

## 2024-02-08 LAB — I-STAT CHEM 8, ED
BUN: 39 mg/dL — ABNORMAL HIGH (ref 8–23)
Calcium, Ion: 0.86 mmol/L — CL (ref 1.15–1.40)
Chloride: 103 mmol/L (ref 98–111)
Creatinine, Ser: 8.4 mg/dL — ABNORMAL HIGH (ref 0.61–1.24)
Glucose, Bld: 76 mg/dL (ref 70–99)
HCT: 29 % — ABNORMAL LOW (ref 39.0–52.0)
Hemoglobin: 9.9 g/dL — ABNORMAL LOW (ref 13.0–17.0)
Potassium: 4.8 mmol/L (ref 3.5–5.1)
Sodium: 138 mmol/L (ref 135–145)
TCO2: 25 mmol/L (ref 22–32)

## 2024-02-08 LAB — COMPREHENSIVE METABOLIC PANEL WITH GFR
ALT: 9 U/L (ref 0–44)
AST: 22 U/L (ref 15–41)
Albumin: 3.5 g/dL (ref 3.5–5.0)
Alkaline Phosphatase: 45 U/L (ref 38–126)
Anion gap: 16 — ABNORMAL HIGH (ref 5–15)
BUN: 41 mg/dL — ABNORMAL HIGH (ref 8–23)
CO2: 24 mmol/L (ref 22–32)
Calcium: 8.1 mg/dL — ABNORMAL LOW (ref 8.9–10.3)
Chloride: 99 mmol/L (ref 98–111)
Creatinine, Ser: 7.79 mg/dL — ABNORMAL HIGH (ref 0.61–1.24)
GFR, Estimated: 7 mL/min — ABNORMAL LOW (ref >60)
Glucose, Bld: 80 mg/dL (ref 70–99)
Potassium: 4.8 mmol/L (ref 3.5–5.1)
Sodium: 139 mmol/L (ref 135–145)
Total Bilirubin: 0.7 mg/dL (ref 0.0–1.2)
Total Protein: 8.6 g/dL — ABNORMAL HIGH (ref 6.5–8.1)

## 2024-02-08 LAB — CBC
HCT: 32.1 % — ABNORMAL LOW (ref 39.0–52.0)
Hemoglobin: 10 g/dL — ABNORMAL LOW (ref 13.0–17.0)
MCH: 34.1 pg — ABNORMAL HIGH (ref 26.0–34.0)
MCHC: 31.2 g/dL (ref 30.0–36.0)
MCV: 109.6 fL — ABNORMAL HIGH (ref 80.0–100.0)
Platelets: 151 10*3/uL (ref 150–400)
RBC: 2.93 MIL/uL — ABNORMAL LOW (ref 4.22–5.81)
RDW: 14.9 % (ref 11.5–15.5)
WBC: 5.3 10*3/uL (ref 4.0–10.5)
nRBC: 0 % (ref 0.0–0.2)

## 2024-02-08 LAB — TROPONIN I (HIGH SENSITIVITY): Troponin I (High Sensitivity): 11 ng/L (ref <18)

## 2024-02-08 LAB — PROTIME-INR
INR: 1.1 (ref 0.8–1.2)
Prothrombin Time: 14.3 s (ref 11.4–15.2)

## 2024-02-08 LAB — ETHANOL: Alcohol, Ethyl (B): <10 mg/dL (ref <10)

## 2024-02-08 MED ORDER — MELATONIN 3 MG PO TABS
3.0000 mg | ORAL_TABLET | Freq: Every evening | ORAL | Status: DC | PRN
  Administered 2024-02-11 – 2024-03-09 (×10): 3 mg via ORAL
  Filled 2024-02-08 (×11): qty 1

## 2024-02-08 MED ORDER — ACETAMINOPHEN 650 MG RE SUPP: 650.0000 mg | Freq: Four times a day (QID) | RECTAL | Status: DC | PRN

## 2024-02-08 MED ORDER — DIAZEPAM 5 MG/ML IJ SOLN
5.0000 mg | Freq: Once | INTRAMUSCULAR | Status: AC
  Administered 2024-02-08: 5 mg via INTRAVENOUS
  Filled 2024-02-08: qty 2

## 2024-02-08 MED ORDER — ACETAMINOPHEN 325 MG PO TABS
650.0000 mg | ORAL_TABLET | Freq: Four times a day (QID) | ORAL | Status: DC | PRN
  Administered 2024-02-18 – 2024-02-21 (×2): 650 mg via ORAL
  Filled 2024-02-08 (×3): qty 2

## 2024-02-08 MED ORDER — HYDROMORPHONE HCL 1 MG/ML IJ SOLN
1.0000 mg | Freq: Once | INTRAMUSCULAR | Status: AC
  Administered 2024-02-08: 1 mg via INTRAVENOUS
  Filled 2024-02-08: qty 1

## 2024-02-08 MED ORDER — ONDANSETRON HCL 4 MG/2ML IJ SOLN
4.0000 mg | Freq: Once | INTRAMUSCULAR | Status: AC
  Administered 2024-02-08: 4 mg via INTRAVENOUS
  Filled 2024-02-08: qty 2

## 2024-02-08 MED ORDER — HYDROMORPHONE HCL 1 MG/ML IJ SOLN: 0.5000 mg | INTRAMUSCULAR | Status: DC | PRN

## 2024-02-08 MED ORDER — MORPHINE SULFATE (PF) 2 MG/ML IV SOLN
2.0000 mg | Freq: Once | INTRAVENOUS | Status: AC
  Administered 2024-02-08: 2 mg via INTRAVENOUS
  Filled 2024-02-08: qty 1

## 2024-02-08 MED ORDER — LORAZEPAM 2 MG/ML IJ SOLN
1.0000 mg | Freq: Once | INTRAMUSCULAR | Status: AC
  Administered 2024-02-08: 1 mg via INTRAVENOUS
  Filled 2024-02-08 (×2): qty 1

## 2024-02-08 MED ORDER — FENTANYL CITRATE PF 50 MCG/ML IJ SOSY
50.0000 ug | PREFILLED_SYRINGE | Freq: Once | INTRAMUSCULAR | Status: AC
  Administered 2024-02-08: 50 ug via INTRAVENOUS
  Filled 2024-02-08 (×2): qty 1

## 2024-02-08 MED ORDER — ONDANSETRON HCL 4 MG/2ML IJ SOLN
4.0000 mg | Freq: Four times a day (QID) | INTRAMUSCULAR | Status: DC | PRN
  Administered 2024-02-12 – 2024-03-16 (×30): 4 mg via INTRAVENOUS
  Filled 2024-02-08 (×31): qty 2

## 2024-02-08 MED ORDER — ACETAMINOPHEN 500 MG PO TABS
1000.0000 mg | ORAL_TABLET | Freq: Once | ORAL | Status: AC
  Administered 2024-02-08: 1000 mg via ORAL
  Filled 2024-02-08: qty 2

## 2024-02-08 MED ORDER — SODIUM CHLORIDE 0.9 % IV BOLUS
500.0000 mL | Freq: Once | INTRAVENOUS | Status: AC
  Administered 2024-02-08: 500 mL via INTRAVENOUS

## 2024-02-08 MED ORDER — HYDROMORPHONE HCL 1 MG/ML IJ SOLN
1.0000 mg | Freq: Once | INTRAMUSCULAR | Status: AC
  Administered 2024-02-08: 1 mg via INTRAVENOUS
  Filled 2024-02-08 (×2): qty 1

## 2024-02-08 MED ORDER — LIDOCAINE 5 % EX PTCH
1.0000 | MEDICATED_PATCH | Freq: Once | CUTANEOUS | Status: DC
  Filled 2024-02-08: qty 1

## 2024-02-08 MED ORDER — NALOXONE HCL 0.4 MG/ML IJ SOLN: 0.4000 mg | INTRAMUSCULAR | Status: DC | PRN

## 2024-02-08 MED ORDER — HYDROMORPHONE HCL 1 MG/ML IJ SOLN
1.0000 mg | INTRAMUSCULAR | Status: DC | PRN
  Administered 2024-02-09 – 2024-02-12 (×17): 1 mg via INTRAVENOUS
  Filled 2024-02-08 (×16): qty 1

## 2024-02-08 NOTE — ED Notes (Signed)
 Called CT about why imaging has not been done and reports they tried to get him around midnight and patient reported he could not lie flat.

## 2024-02-08 NOTE — ED Notes (Signed)
 Patient taken to CT multiple times and each time has refused to move over onto the table due to pain despite being given numerous rounds of pain medicine. DO and RN spoke to patient about the importance of the scans, patient still refused.

## 2024-02-08 NOTE — ED Notes (Signed)
 Patient transported to X-ray

## 2024-02-08 NOTE — ED Notes (Signed)
 Patient still currently reporting he can't lie flat for imaging.

## 2024-02-08 NOTE — ED Notes (Signed)
 Patient transported to CT

## 2024-02-08 NOTE — Progress Notes (Signed)
 Patient states he is unable to lay flat for scans. He states the last time he had a CT scan he had to have Dilaudid  in order to lay down. Ordering provider notified. Informed that he would have to wait to be placed in a room to get meds before we could give him pain meds to get his scans done.

## 2024-02-08 NOTE — H&P (Incomplete)
 History and Physical      MASTER TOUCHET WUJ:[id number] DOB: 04-Aug-1951 DOA: 02/07/2024; DOS: 02/08/2024  PCP: Wright Heal, MD *** Patient coming from: home ***  I have personally briefly reviewed patient's old medical records in [facility]  Chief Complaint: ***  HPI: Thomas [name] is a 73 y.o. male with medical history significant for *** who is admitted to [facility] on 02/07/2024 with *** after presenting from home*** to [facility] ED complaining of ***.    ***       ***   ED Course:  Vital signs in the ED were notable for the following: ***  Labs were notable for the following: ***  Per my interpretation, EKG in ED demonstrated the following:  ***  Imaging in the ED, per corresponding formal radiology read, was notable for the following:  ***  While in the ED, the following were administered: ***  Subsequently, the patient was admitted  ***  ***red    Review of Systems: As per HPI otherwise 10 point review of systems negative.   Past Medical History:  Diagnosis Date   Arthritis    BPH (benign prostatic hyperplasia) 11/24/2019   Chronic pain syndrome 11/24/2019   CKD (chronic kidney disease), stage IV (HCC) 11/24/2019   Diabetes mellitus without complication (HCC)    Gout    Hypertension    Muscle pain    Physical deconditioning 11/24/2019   Scars    Swelling     Past Surgical History:  Procedure Laterality Date   BELOW KNEE LEG AMPUTATION Right    ESOPHAGOGASTRODUODENOSCOPY (EGD) WITH PROPOFOL  N/A 11/25/2019   Procedure: ESOPHAGOGASTRODUODENOSCOPY (EGD) WITH PROPOFOL ;  Surgeon: Alvis Jourdain, MD;  Location: [facility] ENDOSCOPY;  Service: Endoscopy;  Laterality: N/A;   IR FLUORO GUIDE CV LINE RIGHT  09/05/2019   IR LUMBAR DISC ASPIRATION W/IMG GUIDE  11/26/2019   IR US  GUIDE VASC ACCESS RIGHT  09/05/2019   RADIOLOGY WITH ANESTHESIA N/A 11/25/2019   Procedure: Thoracic and Lumbar MRI WITH ANESTHESIA;  Surgeon: Radiologist, Medication, MD;  Location: [facility] OR;   Service: Radiology;  Laterality: N/A;    Social History:  reports that he has never smoked. He has never been exposed to tobacco smoke. He has never used smokeless tobacco. He reports that he does not drink alcohol and does not use drugs.   Allergies  Allergen Reactions   Other Other (See Comments)    Blood pressure issues Per [facility]: Pt states he can only take these pain meds or else he gets very sick: Oxycontin ,morphine ,demerol, and dilaudid  are the only pain meds pt states he can take.     Oxymorphone Other (See Comments)    Causes kidney problems   Amitriptyline Other (See Comments)   Aspirin Nausea And Vomiting    Gi upset, has kidney issues   Beta Vulgaris Nausea And Vomiting   Buspirone Other (See Comments)    Affected his head    Cabbage Nausea And Vomiting   Codeine Other (See Comments)    Pt does not remember the reaction but knows he can not take    Cyclobenzaprine Nausea And Vomiting   Diflunisal Nausea And Vomiting and Other (See Comments)   Fish Allergy Nausea And Vomiting   Fish-Derived Products Nausea And Vomiting   Hydroxyzine Nausea And Vomiting   Levetiracetam Other (See Comments)   Methadone Other (See Comments)    Made him loopy   Nalbuphine Other (See Comments)    Cutaneous eruption (morphologic abnormality)  Pentazocine Other (See Comments)    Pt does not remember the reaction    Propoxyphene Other (See Comments)    Pt does not remember the reaction     Shellfish Allergy Nausea And Vomiting   Sulfa Antibiotics Hives   Sulfasalazine Other (See Comments)    Pt does not remember the reaction   Amoxicillin  Nausea And Vomiting and Rash    Per [facility]    Family History  Problem Relation Age of Onset   Heart disease Mother    Cancer Father     Family history reviewed and not pertinent ***   Prior to Admission medications   Medication Sig Start Date End Date Taking? Authorizing Provider  acetaminophen  (TYLENOL ) 650 MG  CR tablet Take 1,300 mg by mouth every 8 (eight) hours as needed for pain.    [provider]  amLODipine  (NORVASC ) 5 MG tablet Take 5 mg by mouth daily.    [provider]  atorvastatin  (LIPITOR ) 80 MG tablet Take 80 mg by mouth daily.    [provider]  bethanechol  (URECHOLINE ) 10 MG tablet Take 10 mg by mouth 2 (two) times daily. 12/21/23   [provider]  cephALEXin  (KEFLEX ) 500 MG capsule Take 1 capsule (500 mg total) by mouth 2 (two) times daily. 01/01/24   Hershel Los, MD  Cholecalciferol  125 MCG (5000 UT) capsule Take 5,000 Units by mouth daily.    [provider]  cloNIDine  (CATAPRES ) 0.1 MG tablet Take 0.1 mg by mouth 3 (three) times daily.    [provider]  cyanocobalamin  (VITAMIN B12) 1000 MCG tablet Take 1 tablet by mouth daily.    [provider]  furosemide  (LASIX ) 40 MG tablet Take 40 mg by mouth daily. 11/26/23   [provider]  hydrALAZINE  (APRESOLINE ) 25 MG tablet Take 25 mg by mouth 3 (three) times daily.    [provider]  levothyroxine  (SYNTHROID ) 50 MCG tablet Take 50 mcg by mouth daily before breakfast.     [provider]  losartan  (COZAAR ) 50 MG tablet Take 50 mg by mouth at bedtime. 11/11/23   [provider]  magnesium  oxide (MAG-OX) 400 MG tablet Take 800 mg by mouth in the morning, at noon, and at bedtime.    [provider]  metoprolol  tartrate (LOPRESSOR ) 50 MG tablet Take 1 tablet (50 mg total) by mouth 2 (two) times daily. 12/24/21   Samtani, Jai-Gurmukh, MD  OXYCONTIN  30 MG 12 hr tablet Take 30 mg by mouth 3 (three) times daily as needed (pain). 12/30/21   [provider]  pantoprazole  (PROTONIX ) 40 MG tablet Take 1 tablet by mouth 2 times daily. Patient not taking: Reported on 01/02/2024 10/01/21 01/01/25  Bary Boss, DO  senna-docusate (SENOKOT-S) 8.6-50 MG tablet Take 4 tablets by mouth 2 (two) times daily. 11/04/22   [provider]   sevelamer  (RENAGEL ) 800 MG tablet Take 1,600 mg by mouth 3 (three) times daily. 08/27/23   [provider]  tamsulosin  (FLOMAX ) 0.4 MG CAPS capsule Take 1 capsule (0.4 mg total) by mouth daily. 09/25/20   Avi Body, MD  tiZANidine  (ZANAFLEX ) 4 MG tablet Take 4 mg by mouth every 8 (eight) hours as needed for muscle spasms. 11/26/21   [provider]  Vitamin E 670 MG (1000 UT) CAPS Take 1,000 mg by mouth daily.    [provider]  warfarin (COUMADIN ) 10 MG tablet Take 10 mg by mouth daily.    [provider]  warfarin (COUMADIN ) 5 MG tablet Take 0.5 tablets (2.5 mg total) by mouth daily. Patient not taking: Reported on 01/02/2024 01/03/22   Verlie Glisson, MD     Objective    Physical Exam: Vitals:   02/08/24 1715 02/08/24 1730 02/08/24 1850 02/08/24 2108  BP:  (!) 181/95 (!) 185/108 (!) 176/124  Pulse: 65 (!) 57 (!) 57 (!) 58  Resp: 12 12 11  (!) 9  Temp:    98.1 F (36.7 C)  TempSrc:    Oral  SpO2: 97% 97% 97% 100%  Weight:      Height:        General: appears to be stated age; alert, oriented Skin: warm, dry, no rash Head:  AT/[place] Mouth:  Oral mucosa membranes appear moist, normal dentition Neck: supple; trachea midline Heart:  RRR; did not appreciate any M/R/G Lungs: CTAB, did not appreciate any wheezes, rales, or rhonchi Abdomen: + BS; soft, ND, NT Vascular: 2+ pedal pulses b/l; 2+ radial pulses b/l Extremities: no peripheral edema, no muscle wasting Neuro: strength and sensation intact in upper and lower extremities b/l ***   *** Neuro: 5/5 strength of the proximal and distal flexors and extensors of the upper and lower extremities bilaterally; sensation intact in upper and lower extremities b/l; cranial nerves II through XII grossly intact; no pronator drift; no evidence suggestive of slurred speech, dysarthria, or facial droop; Normal muscle tone. No tremors.  *** Neuro: In the setting of the patient's current mental status  and associated inability to follow instructions, unable to perform full neurologic exam at this time.  As such, assessment of strength, sensation, and cranial nerves is limited at this time. Patient noted to spontaneously move all 4 extremities. No tremors.  ***    Labs on Admission: I have personally reviewed following labs and imaging studies  CBC: Recent Labs  Lab 02/08/24 0639 02/08/24 0643  WBC 5.3  --   HGB 10.0* 9.9*  HCT 32.1* 29.0*  MCV 109.6*  --   PLT 151  --    Basic Metabolic Panel: Recent Labs  Lab 02/08/24 0639 02/08/24 0643  NA 139 138  K 4.8 4.8  CL 99 103  CO2 24  --   GLUCOSE 80 76  BUN 41* 39*  CREATININE 7.79* 8.40*  CALCIUM  8.1*  --    GFR: Estimated Creatinine Clearance: 10.3 mL/min (A) (by C-G formula based on SCr of 8.4 mg/dL (H)). Liver Function Tests: Recent Labs  Lab 02/08/24 0639  AST 22  ALT 9  ALKPHOS 45  BILITOT 0.7  PROT 8.6*  ALBUMIN  3.5   No results for input(s): "LIPASE", "AMYLASE" in the last 168 hours. No results for input(s): "AMMONIA" in the last 168 hours. Coagulation Profile: Recent Labs  Lab 02/08/24 0639  INR 1.1   Cardiac Enzymes: No results for input(s): "CKTOTAL", "CKMB", "CKMBINDEX", "TROPONINI" in the last 168 hours. BNP (last 3 results) No results for input(s): "PROBNP" in the last 8760 hours. HbA1C: No results for input(s): "HGBA1C" in the last 72 hours. CBG: No results for input(s): "GLUCAP" in the last 168 hours. Lipid Profile: No results for input(s): "CHOL", "HDL", "LDLCALC", "TRIG", "CHOLHDL", "LDLDIRECT" in the last 72 hours. Thyroid Function Tests: No results for input(s): "TSH", "T4TOTAL", "FREET4", "T3FREE", "THYROIDAB" in the last 72 hours. Anemia Panel: No results for input(s): "VITAMINB12", "FOLATE", "FERRITIN", "TIBC", "IRON ", "RETICCTPCT" in the last 72 hours. Urine analysis:    Component Value Date/Time   COLORURINE YELLOW 01/14/2022 0248   APPEARANCEUR HAZY (  A) 01/14/2022 0248    LABSPEC 1.009 01/14/2022 0248   PHURINE 7.0 01/14/2022 0248   GLUCOSEU NEGATIVE 01/14/2022 0248   HGBUR MODERATE (A) 01/14/2022 0248   BILIRUBINUR NEGATIVE 01/14/2022 0248   KETONESUR NEGATIVE 01/14/2022 0248   PROTEINUR 100 (A) 01/14/2022 0248   NITRITE NEGATIVE 01/14/2022 0248   LEUKOCYTESUR LARGE (A) 01/14/2022 0248    Radiological Exams on Admission: CT CHEST ABDOMEN PELVIS WO CONTRAST Addendum Date: 02/08/2024 ADDENDUM REPORT: 02/08/2024 14:27 ADDENDUM: After comparing to CT thoracic spine performed today, the abnormal changes in the lower thoracic spine include collapse of the T10 vertebral body, lucency around the T10 screws, T9-10 disc space and T10-11 disc space with abnormal surrounding paraspinal soft tissue swelling. Findings remain concerning for discitis/osteomyelitis. Electronically Signed   By: Janeece Mechanic M.D.   On: 02/08/2024 14:27   Result Date: 02/08/2024 CLINICAL DATA:  Polytrauma, blunt.  Fall. EXAM: CT CHEST, ABDOMEN AND PELVIS WITHOUT CONTRAST TECHNIQUE: Multidetector CT imaging of the chest, abdomen and pelvis was performed following the standard protocol without IV contrast. RADIATION DOSE REDUCTION: This exam was performed according to the departmental dose-optimization program which includes automated exposure control, adjustment of the mA and/or kV according to patient size and/or use of iterative reconstruction technique. COMPARISON:  01/12/2024 FINDINGS: CT CHEST FINDINGS Cardiovascular: Scattered coronary artery and aortic calcifications. Heart is normal size. Aorta is normal caliber. Mediastinum/Nodes: No mediastinal, hilar, or axillary adenopathy. Trachea and esophagus are unremarkable. Thyroid unremarkable. Lungs/Pleura: Chronic changes in the lungs with areas of scarring bilaterally. No effusions or pneumothorax. Musculoskeletal: Postoperative changes with posterior spinal hardware at T10, T12 and extending through the lumbar spine. Stable lucency noted around the  posterior spinal screws at T10 and at the T10-11 disc space. There is abnormal paraspinal soft tissue extending from T9-10 through T10-11. Appearance is concerning for possible with discitis/osteomyelitis. CT ABDOMEN PELVIS FINDINGS Hepatobiliary: No focal hepatic abnormality. Gallbladder unremarkable. Pancreas: No focal abnormality or ductal dilatation. Spleen: No focal abnormality.  Normal size. Adrenals/Urinary Tract: Adrenal glands normal. Severe chronic right hydronephrosis is stable when compared to prior study. Overlying cortical thinning. No hydronephrosis on the left. Urinary bladder unremarkable. Stomach/Bowel: Normal appendix. Stomach, large and small bowel grossly unremarkable. Vascular/Lymphatic: No evidence of aneurysm or adenopathy. Aortic atherosclerosis. Infrarenal IVC filter noted. Reproductive: No visible focal abnormality. Other: No free fluid or free air. Musculoskeletal: Postoperative changes in the lumbar spine with severe degenerative changes throughout. Mild compression fracture at L1 is stable. Severe diffuse osteopenia. IMPRESSION: No acute traumatic injury in the chest, abdomen or pelvis. Abnormal appearance at T10 and T10-11 with bone destruction surrounding the posterior pedicle screws at T10 and surrounding the T10-11 disc space. Adjacent abnormal paraspinal soft tissues. Appearance is concerning for possible discitis/osteomyelitis. Consider further evaluation with MRI. Chronic changes in the lungs with areas of scarring throughout. Coronary artery disease, aortic atherosclerosis. Severe chronic right hydronephrosis is stable since prior study. Electronically Signed: By: Janeece Mechanic M.D. On: 02/08/2024 14:10   CT Cervical Spine Wo Contrast Result Date: 02/08/2024 CLINICAL DATA:  Neck trauma (Age >= 65y).  Fall EXAM: CT CERVICAL SPINE WITHOUT CONTRAST TECHNIQUE: Multidetector CT imaging of the cervical spine was performed without intravenous contrast. Multiplanar CT image  reconstructions were also generated. RADIATION DOSE REDUCTION: This exam was performed according to the departmental dose-optimization program which includes automated exposure control, adjustment of the mA and/or kV according to patient size and/or use of iterative reconstruction technique. COMPARISON:  02/05/2024 FINDINGS: Alignment: Degenerative anterolisthesis in the  lower cervical spine, stable. Skull base and vertebrae: No acute fracture. Mild irregularity and collapse of the C5, C6 and C7 vertebral bodies is stable since prior study and likely related to advanced degenerative changes. Soft tissues and spinal canal: No prevertebral fluid or swelling. No visible canal hematoma. Disc levels: Advanced degenerative disc disease at C5-6 through C7-T1 with endplate irregularity and collapse noted at C5, C6 and C7 likely related to advanced degenerative changes. This is stable since prior study. Upper chest: No acute findings Other: None IMPRESSION: No acute bony abnormality. Advanced degenerative disc disease at C5-6 through C7-T1. Electronically Signed   By: Janeece Mechanic M.D.   On: 02/08/2024 14:24   CT Head Wo Contrast Result Date: 02/08/2024 CLINICAL DATA:  Head trauma, minor (Age >= 65y).  Fall. EXAM: CT HEAD WITHOUT CONTRAST TECHNIQUE: Contiguous axial images were obtained from the base of the skull through the vertex without intravenous contrast. RADIATION DOSE REDUCTION: This exam was performed according to the departmental dose-optimization program which includes automated exposure control, adjustment of the mA and/or kV according to patient size and/or use of iterative reconstruction technique. COMPARISON:  12/22/2023 FINDINGS: Brain: Diffuse cerebral atrophy. No acute intracranial abnormality. Specifically, no hemorrhage, hydrocephalus, mass lesion, acute infarction, or significant intracranial injury. Vascular: No hyperdense vessel or unexpected calcification. Skull: No acute calvarial abnormality.  Sinuses/Orbits: No acute findings Other: None IMPRESSION: Atrophy.  No acute intracranial abnormality. Electronically Signed   By: Janeece Mechanic M.D.   On: 02/08/2024 14:21   CT T-SPINE NO CHARGE Result Date: 02/08/2024 CLINICAL DATA:  Fall, trauma EXAM: CT THORACIC SPINE WITHOUT CONTRAST TECHNIQUE: Multidetector CT images of the thoracic were obtained using the standard protocol without intravenous contrast. RADIATION DOSE REDUCTION: This exam was performed according to the departmental dose-optimization program which includes automated exposure control, adjustment of the mA and/or kV according to patient size and/or use of iterative reconstruction technique. COMPARISON:  Thoracic MRI 09/17/2021 FINDINGS: Alignment: Normal Vertebrae: Postoperative changes from posterior fusion from T10-T12. There is collapse of the T10 vertebral body with abnormal lucency around the T10 screws, the T9-10 disc base and T10-11 disc space. Surrounding paraspinal soft tissue swelling. Appearance is concerning for discitis/osteomyelitis. Paraspinal and other soft tissues: Abnormal paraspinal soft tissue from T9-T11. Disc levels: Advanced degenerative changes in the lower thoracic spine and lower cervical spine. IMPRESSION: No acute traumatic findings. Prior posterior fusion from T10-T12. Abnormal appearance with collapse of the T10 vertebral body, lucency around the T10 screws, at the T9-10 disc space and T10-11 disc space concerning for discitis/osteomyelitis. Abnormal surrounding paraspinal soft tissue swelling. Electronically Signed   By: Janeece Mechanic M.D.   On: 02/08/2024 14:19   CT L-SPINE NO CHARGE Result Date: 02/08/2024 CLINICAL DATA:  Fall, trauma EXAM: CT LUMBAR SPINE WITHOUT CONTRAST TECHNIQUE: Multidetector CT imaging of the lumbar spine was performed without intravenous contrast administration. Multiplanar CT image reconstructions were also generated. RADIATION DOSE REDUCTION: This exam was performed according to the  departmental dose-optimization program which includes automated exposure control, adjustment of the mA and/or kV according to patient size and/or use of iterative reconstruction technique. COMPARISON:  CT abdomen and pelvis 01/01/2024. FINDINGS: Segmentation: 5 lumbar type vertebrae. Alignment: No subluxation Vertebrae: Severe diffuse osteopenia. Mild chronic compression fracture at L1 is stable since prior study. Postoperative changes from posterior fusion. Advanced degenerative disc and facet disease. Fusion of the facets diffusely. Paraspinal and other soft tissues: No paraspinal soft tissue abnormality in the lumbar spine. Disc levels: Severe diffuse degenerative disc  disease and facet disease. Fusion across the facet joints diffusely. IMPRESSION: No acute bony abnormality. Mild chronic compression fracture at L1. Severe osteopenia. Posterior fusion changes throughout the lumbar spine and extending into the lower thoracic spine. Severe diffuse degenerative disc and facet disease with fusion across the facet joints. Electronically Signed   By: Janeece Mechanic M.D.   On: 02/08/2024 14:16   DG Wrist Complete Left Result Date: 02/08/2024 CLINICAL DATA:  Fall. EXAM: LEFT WRIST - COMPLETE 3+ VIEW COMPARISON:  None Available. FINDINGS: There is 2 mm ulnar negative variance. Severe radiocarpal joint space narrowing with radius-scaphoid bone-on-bone contact. Mild 1 mm chronic impression/erosion by the scaphoid on the distal radius. 10 mm lucent cyst within the distal lateral aspect of the radius and 11 mm lucent cyst within the distal ulna, possibly degenerative. Severe triscaphe joint space narrowing, subchondral sclerosis, and peripheral osteophytosis. Moderate to severe thumb carpometacarpal joint space narrowing and peripheral osteophytosis. No acute fracture is seen.  No dislocation. IMPRESSION: 1. No acute fracture is seen. 2. Severe radiocarpal and triscaphe osteoarthritis. 3. Moderate to severe thumb  carpometacarpal osteoarthritis. Electronically Signed   By: Bertina Broccoli M.D.   On: 02/08/2024 08:42   DG Elbow Complete Right Result Date: 02/08/2024 CLINICAL DATA:  Right elbow pain.  Fall. EXAM: RIGHT ELBOW - COMPLETE 3+ VIEW COMPARISON:  None Available. FINDINGS: There is diffuse decreased bone mineralization. Normal position of the distal anterior humeral fat pad without evidence of elbow joint effusion. Mild peripheral medial coronoid process degenerative spurring at the medial aspect of the elbow. Mild degenerative spurring at the volar tip of the coronoid process and the posterior volar tip of the olecranon. There is mild to moderate thickening of the olecranon bursa, measuring up to approximately 12 mm in AP dimension. There are numerous small calcific density seen throughout the olecranon bursa measuring up to approximately 3.9 cm in craniocaudal dimension and 1.2 cm in AP dimension. IMPRESSION: 1. Mild to moderate thickening of the olecranon bursa with numerous small calcific densities throughout the olecranon bursa. 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 2. No acute fracture is seen. Electronically Signed   By: Bertina Broccoli M.D.   On: 02/08/2024 08:40   DG Shoulder Left Result Date: 02/08/2024 CLINICAL DATA:  Fall. EXAM: LEFT SHOULDER - 2+ VIEW COMPARISON:  None Available. FINDINGS: There is diffuse decreased bone mineralization. Mild inferior glenohumeral joint space narrowing and inferior glenoid and humeral head-neck junction degenerative spurring. Mild degenerative spurring at the superior aspect of the acromioclavicular joint. There are multiple linear calcific densities overlying the bilateral, mid to inferior and mid to posterior aspect of the shoulder, possibly within the region of the deltoid musculature, nonspecific and chronic. No acute fracture is seen. No dislocation. IMPRESSION: 1. Mild glenohumeral and acromioclavicular osteoarthritis. 2. No acute fracture is  seen. Electronically Signed   By: Bertina Broccoli M.D.   On: 02/08/2024 08:37      Assessment/Plan   Principal Problem:   Osteomyelitis of thoracic spine (HCC)   ***            ***                  ***                   ***                  ***                  ***                  ***                   ***                  ***                  ***                  ***                  ***                 ***                ***  DVT prophylaxis: SCD's ***  Code Status: Full code*** Family Communication: none*** Disposition Plan: Per Rounding Team Consults called: none***;  Admission status: ***     I SPENT GREATER THAN 75 *** MINUTES IN CLINICAL CARE TIME/MEDICAL DECISION-MAKING IN COMPLETING THIS ADMISSION.      Gattis Kass Guerino Caporale DO [facility]  From 7PM - 7AM   02/08/2024, 10:11 PM   ***

## 2024-02-08 NOTE — ED Provider Notes (Signed)
  Provider Note MRN:  [id number]  Arrival date & time: 02/08/24    ED Course and Medical Decision Making  Assumed care from Dr Adrain Alar at shift change.  See note from prior team for complete details, in brief:  Clinical Course as of 02/08/24 1542  Mon Feb 08, 2024  0730 Handoff JL 73 yo male with fall @ home Pain all over Pending imaging  [SG]  1107 Pt unable to lie flat for CT, says his back is having bad spasms. Requiring multiple doses of parenteral analgesics.  [SG]  1248 Pt refusing imaging, I spoke with him in the CT room and he says that he doesn't want the imaging. When we got back to his room he is saying now that he is feeling better and would like to try again for the imaging.   [SG]  1414 CT concerning for osteomyelitis/discitis per radiology, he is ttp at area of concern. Will try for MRI, pt has a lot of difficulty with positioning/pain control  [SG]    Clinical Course User Index [SG] Russella Courts A, DO    CT  w/ concern for possible osteomyelitis thoracic spine, he has no acute neuro deficits, no fever, will proceed with MRI as he is TTP to this approximately area although this is same area where he had direct trauma preceding his visit to the ER  Handoff to Dr pickering pending imaging and recheck, likely need to d/w nsgy   CRITICAL CARE Performed by: Teddi Favors   Total critical care time: 32 minutes  Critical care time was exclusive of separately billable procedures and treating other patients.  Critical care was necessary to treat or prevent imminent or life-threatening deterioration.  Critical care was time spent personally by me on the following activities: development of treatment plan with patient and/or surrogate as well as nursing, discussions with consultants, evaluation of patient's response to treatment, examination of patient, obtaining history from patient or surrogate, ordering and performing treatments and interventions, ordering and review of  laboratory studies, ordering and review of radiographic studies, pulse oximetry and re-evaluation of patient's condition.  Procedures  Final Clinical Impressions(s) / ED Diagnoses     ICD-10-CM   1. Fall, initial encounter  W19.XXXA     2. Acute midline thoracic back pain  M54.6       ED Discharge Orders     None       Discharge Instructions   None        Teddi Favors, DO 02/08/24 1543

## 2024-02-08 NOTE — ED Provider Notes (Signed)
 [place] EMERGENCY DEPARTMENT AT [facility] Provider Note   CSN: [id number] Arrival date & time: 02/07/24  2325     History  Chief Complaint  Patient presents with   Thomas [name] is a 73 y.o. male.   Fall     73 year old male with medical history significant for CKD, DM2, HTN, history of BKA on the right, PE, chronic osteomyelitis of the spine, history of cervical spine fracture status post repair presenting to the Emergency Department after a ground-level mechanical fall.  The patient states that around 1 PM yesterday he fell after losing his balance and tripping.  He landed on his neck and he was been having neck pain as well as back pain.  He endorses sternal pain as well as generalized abdominal pain.  He endorses a history of radiculopathy in the setting of his previous neck surgery.  He arrives GCS 15, ABC intact.  Home Medications Prior to Admission medications   Medication Sig Start Date End Date Taking? Authorizing Provider  acetaminophen  (TYLENOL ) 650 MG CR tablet Take 1,300 mg by mouth every 8 (eight) hours as needed for pain.    [provider]  amLODipine  (NORVASC ) 5 MG tablet Take 5 mg by mouth daily.    [provider]  atorvastatin  (LIPITOR ) 80 MG tablet Take 80 mg by mouth daily.    [provider]  bethanechol  (URECHOLINE ) 10 MG tablet Take 10 mg by mouth 2 (two) times daily. 12/21/23   [provider]  cephALEXin  (KEFLEX ) 500 MG capsule Take 1 capsule (500 mg total) by mouth 2 (two) times daily. 01/01/24   Hershel Los, MD  Cholecalciferol  125 MCG (5000 UT) capsule Take 5,000 Units by mouth daily.    [provider]  cloNIDine  (CATAPRES ) 0.1 MG tablet Take 0.1 mg by mouth 3 (three) times daily.    [provider]  cyanocobalamin  (VITAMIN B12) 1000 MCG tablet Take 1 tablet by mouth daily.    [provider]  furosemide  (LASIX ) 40 MG tablet Take 40 mg by mouth daily. 11/26/23    [provider]  hydrALAZINE  (APRESOLINE ) 25 MG tablet Take 25 mg by mouth 3 (three) times daily.    [provider]  levothyroxine  (SYNTHROID ) 50 MCG tablet Take 50 mcg by mouth daily before breakfast.     [provider]  losartan  (COZAAR ) 50 MG tablet Take 50 mg by mouth at bedtime. 11/11/23   [provider]  magnesium  oxide (MAG-OX) 400 MG tablet Take 800 mg by mouth in the morning, at noon, and at bedtime.    [provider]  metoprolol  tartrate (LOPRESSOR ) 50 MG tablet Take 1 tablet (50 mg total) by mouth 2 (two) times daily. 12/24/21   Samtani, Jai-Gurmukh, MD  OXYCONTIN  30 MG 12 hr tablet Take 30 mg by mouth 3 (three) times daily as needed (pain). 12/30/21   [provider]  pantoprazole  (PROTONIX ) 40 MG tablet Take 1 tablet by mouth 2 times daily. Patient not taking: Reported on 01/02/2024 10/01/21 01/01/25  Bary Boss, DO  senna-docusate (SENOKOT-S) 8.6-50 MG tablet Take 4 tablets by mouth 2 (two) times daily. 11/04/22   [provider]  sevelamer  (RENAGEL ) 800 MG tablet Take 1,600 mg by mouth 3 (three) times daily. 08/27/23   [provider]  tamsulosin  (FLOMAX ) 0.4 MG CAPS capsule Take 1 capsule (0.4 mg total) by mouth daily. 09/25/20   Avi Body, MD  tiZANidine  (ZANAFLEX ) 4 MG tablet Take  4 mg by mouth every 8 (eight) hours as needed for muscle spasms. 11/26/21   [provider]  Vitamin E 670 MG (1000 UT) CAPS Take 1,000 mg by mouth daily.    [provider]  warfarin (COUMADIN ) 10 MG tablet Take 10 mg by mouth daily.    [provider]  warfarin (COUMADIN ) 5 MG tablet Take 0.5 tablets (2.5 mg total) by mouth daily. Patient not taking: Reported on 01/02/2024 01/03/22   Samtani, Jai-Gurmukh, MD      Allergies    Other, Oxymorphone, Amitriptyline, Aspirin, Beta vulgaris, Buspirone, Cabbage, Codeine, Cyclobenzaprine, Diflunisal, Fish allergy, Fish-derived products, Hydroxyzine,  Levetiracetam, Methadone, Nalbuphine, Pentazocine, Propoxyphene, Shellfish allergy, Sulfa antibiotics, Sulfasalazine, and Amoxicillin     Review of Systems   Review of Systems  All other systems reviewed and are negative.   Physical Exam Updated Vital Signs BP (!) 189/98 (BP Location: Right Arm)   Pulse (!) 55   Temp 98.3 F (36.8 C)   Resp 18   Ht 6\' 5"  (1.956 m)   Wt 94.8 kg   SpO2 100%   BMI 24.78 kg/m  Physical Exam Vitals and nursing note reviewed.  Constitutional:      Appearance: He is well-developed.     Comments: GCS 15, ABC intact  HENT:     Head: Normocephalic.  Eyes:     Conjunctiva/sclera: Conjunctivae normal.  Neck:     Comments: Midline tenderness to palpation, c-collar placed following my evaluation Cardiovascular:     Rate and Rhythm: Normal rate and regular rhythm.  Pulmonary:     Effort: Pulmonary effort is normal. No respiratory distress.     Breath sounds: Normal breath sounds.  Chest:     Comments: Chest wall stable with sternal tenderness,clavicles stable and non-tender to AP compression Abdominal:     Palpations: Abdomen is soft.     Tenderness: There is generalized abdominal tenderness.     Comments: Pelvis stable to lateral compression.  Musculoskeletal:     Cervical back: Neck supple.     Comments: Midline tenderness to palpation of the thoracic or lumbar spine. Extremities atraumatic with intact ROM with the exception of left wrist tenderness, left shoulder tenderness, right elbow tenderness  Skin:    General: Skin is warm and dry.  Neurological:     Mental Status: He is alert.     Comments: CN II-XII grossly intact. Moving all four extremities spontaneously and sensation grossly intact.     ED Results / Procedures / Treatments   Labs (all labs ordered are listed, but only abnormal results are displayed) Labs Reviewed  I-STAT CHEM 8, ED - Abnormal; Notable for the following components:      Result Value   BUN 39 (*)    Creatinine,  Ser 8.40 (*)    Calcium , Ion 0.86 (*)    Hemoglobin 9.9 (*)    HCT 29.0 (*)    All other components within normal limits  COMPREHENSIVE METABOLIC PANEL WITH GFR  CBC  ETHANOL  PROTIME-INR  TROPONIN I (HIGH SENSITIVITY)    EKG None  Radiology No results found.  Procedures Procedures    Medications Ordered in ED Medications  fentaNYL  (SUBLIMAZE ) injection 50 mcg (has no administration in time range)  oxyCODONE -acetaminophen  (PERCOCET/ROXICET) 5-325 MG per tablet 1 tablet (1 tablet Oral Given 02/07/24 2342)    ED Course/ Medical Decision Making/ A&P  Medical Decision Making Amount and/or Complexity of Data Reviewed Labs: ordered. Radiology: ordered.  Risk Prescription drug management.     73 year old male with medical history significant for CKD, DM2, HTN, history of BKA on the right, PE, chronic osteomyelitis of the spine, history of cervical spine fracture status post repair presenting to the Emergency Department after a ground-level mechanical fall.  The patient states that around 1 PM yesterday he fell after losing his balance and tripping.  He landed on his neck and he was been having neck pain as well as back pain.  He endorses sternal pain as well as generalized abdominal pain.  He endorses a history of radiculopathy in the setting of his previous neck surgery.  He arrives GCS 15, ABC intact.  C-collar placed on my evaluation.  Patient arrives vitally stable complaining of severe pain in his neck and back.  Primary and secondary survey concerning for tenderness throughout the midline of the back, extremity tenderness as above.  Patient also had mid sternal tenderness and generalized abdominal tenderness on exam.  Given the patient's history, CT imaging was obtained to further evaluate.  Patient requires further pain control in order to lie flat for CT imaging.  IV fentanyl  ordered.  Plan follow-up results of CT and x-ray imaging for full  trauma workup, ultimate disposition pending results of imaging, diagnostic testing and further evaluation.  Signout given to Dr. Martina Sledge at 0 700.   Final Clinical Impression(s) / ED Diagnoses Final diagnoses:  Fall, initial encounter    Rx / DC Orders ED Discharge Orders     None         Rosealee Concha, MD 02/08/24 [phone]

## 2024-02-08 NOTE — ED Provider Notes (Signed)
  Physical Exam  BP (!) 185/108   Pulse (!) 57   Temp 97.8 F (36.6 C) (Oral)   Resp 11   Ht 6\' 5"  (1.956 m)   Wt 94.8 kg   SpO2 97%   BMI 24.78 kg/m   Physical Exam  Procedures  Procedures  ED Course / MDM   Clinical Course as of 02/08/24 2115  Mon Feb 08, 2024  0730 Handoff JL 73 yo male with fall @ home Pain all over Pending imaging  [SG]  1107 Pt unable to lie flat for CT, says his back is having bad spasms. Requiring multiple doses of parenteral analgesics.  [SG]  1248 Pt refusing imaging, I spoke with him in the CT room and he says that he doesn't want the imaging. When we got back to his room he is saying now that he is feeling better and would like to try again for the imaging.   [SG]  1414 CT concerning for osteomyelitis/discitis per radiology, he is ttp at area of concern. Will try for MRI, pt has a lot of difficulty with positioning/pain control  [SG]    Clinical Course User Index [SG] Teddi Favors, DO   Medical Decision Making Amount and/or Complexity of Data Reviewed Labs: ordered. Radiology: ordered.  Risk OTC drugs. Prescription drug management. Decision regarding hospitalization.   Sign out for MRI.  Back pain.  Patient however refuses MRI.  States he was told last time never to have one again because he could die.  Will discuss with neurosurgery but likely require admission to hospital.  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  Will discuss with unassigned medicine for admission.  Discussed with Megan from neurosurgery.  They will see tomorrow.  Potentially IR could do a sampling or drain if needed.         Thomas Arias, MD 02/08/24 2137

## 2024-02-09 ENCOUNTER — Inpatient Hospital Stay (HOSPITAL_COMMUNITY)

## 2024-02-09 ENCOUNTER — Encounter (HOSPITAL_COMMUNITY): Payer: Self-pay | Admitting: Internal Medicine

## 2024-02-09 DIAGNOSIS — E039 Hypothyroidism, unspecified: Secondary | ICD-10-CM | POA: Diagnosis not present

## 2024-02-09 DIAGNOSIS — W19XXXA Unspecified fall, initial encounter: Secondary | ICD-10-CM | POA: Diagnosis not present

## 2024-02-09 DIAGNOSIS — Z862 Personal history of diseases of the blood and blood-forming organs and certain disorders involving the immune mechanism: Secondary | ICD-10-CM

## 2024-02-09 DIAGNOSIS — N186 End stage renal disease: Secondary | ICD-10-CM

## 2024-02-09 DIAGNOSIS — M545 Low back pain, unspecified: Secondary | ICD-10-CM | POA: Diagnosis present

## 2024-02-09 DIAGNOSIS — M4624 Osteomyelitis of vertebra, thoracic region: Secondary | ICD-10-CM | POA: Diagnosis not present

## 2024-02-09 DIAGNOSIS — M546 Pain in thoracic spine: Secondary | ICD-10-CM

## 2024-02-09 DIAGNOSIS — I10 Essential (primary) hypertension: Secondary | ICD-10-CM | POA: Diagnosis not present

## 2024-02-09 DIAGNOSIS — N189 Chronic kidney disease, unspecified: Secondary | ICD-10-CM

## 2024-02-09 LAB — SEDIMENTATION RATE: Sed Rate: 112 mm/h — ABNORMAL HIGH (ref 0–16)

## 2024-02-09 LAB — COMPREHENSIVE METABOLIC PANEL WITH GFR
ALT: 8 U/L (ref 0–44)
AST: 20 U/L (ref 15–41)
Albumin: 3.4 g/dL — ABNORMAL LOW (ref 3.5–5.0)
Alkaline Phosphatase: 50 U/L (ref 38–126)
Anion gap: 16 — ABNORMAL HIGH (ref 5–15)
BUN: 47 mg/dL — ABNORMAL HIGH (ref 8–23)
CO2: 21 mmol/L — ABNORMAL LOW (ref 22–32)
Calcium: 8 mg/dL — ABNORMAL LOW (ref 8.9–10.3)
Chloride: 101 mmol/L (ref 98–111)
Creatinine, Ser: 8.57 mg/dL — ABNORMAL HIGH (ref 0.61–1.24)
GFR, Estimated: 6 mL/min — ABNORMAL LOW (ref >60)
Glucose, Bld: 58 mg/dL — ABNORMAL LOW (ref 70–99)
Potassium: 5.7 mmol/L — ABNORMAL HIGH (ref 3.5–5.1)
Sodium: 138 mmol/L (ref 135–145)
Total Bilirubin: 0.9 mg/dL (ref 0.0–1.2)
Total Protein: 8.2 g/dL — ABNORMAL HIGH (ref 6.5–8.1)

## 2024-02-09 LAB — FOLATE: Folate: 15.9 ng/mL (ref >5.9)

## 2024-02-09 LAB — CBC WITH DIFFERENTIAL/PLATELET
Abs Immature Granulocytes: 0.01 10*3/uL (ref 0.00–0.07)
Basophils Absolute: 0 10*3/uL (ref 0.0–0.1)
Basophils Relative: 1 %
Eosinophils Absolute: 0.2 10*3/uL (ref 0.0–0.5)
Eosinophils Relative: 3 %
HCT: 32.5 % — ABNORMAL LOW (ref 39.0–52.0)
Hemoglobin: 10.3 g/dL — ABNORMAL LOW (ref 13.0–17.0)
Immature Granulocytes: 0 %
Lymphocytes Relative: 20 %
Lymphs Abs: 1.1 10*3/uL (ref 0.7–4.0)
MCH: 34.2 pg — ABNORMAL HIGH (ref 26.0–34.0)
MCHC: 31.7 g/dL (ref 30.0–36.0)
MCV: 108 fL — ABNORMAL HIGH (ref 80.0–100.0)
Monocytes Absolute: 0.5 10*3/uL (ref 0.1–1.0)
Monocytes Relative: 9 %
Neutro Abs: 3.8 10*3/uL (ref 1.7–7.7)
Neutrophils Relative %: 67 %
Platelets: 169 10*3/uL (ref 150–400)
RBC: 3.01 MIL/uL — ABNORMAL LOW (ref 4.22–5.81)
RDW: 14.6 % (ref 11.5–15.5)
WBC: 5.6 10*3/uL (ref 4.0–10.5)
nRBC: 0 % (ref 0.0–0.2)

## 2024-02-09 LAB — GLUCOSE, CAPILLARY
Glucose-Capillary: 82 mg/dL (ref 70–99)
Glucose-Capillary: 141 mg/dL — ABNORMAL HIGH (ref 70–99)

## 2024-02-09 LAB — VITAMIN B12: Vitamin B-12: 1297 pg/mL — ABNORMAL HIGH (ref 180–914)

## 2024-02-09 LAB — PHOSPHORUS: Phosphorus: 8.2 mg/dL — ABNORMAL HIGH (ref 2.5–4.6)

## 2024-02-09 LAB — HEPATITIS B SURFACE ANTIGEN: Hepatitis B Surface Ag: NONREACTIVE

## 2024-02-09 LAB — C-REACTIVE PROTEIN: CRP: 1.4 mg/dL — ABNORMAL HIGH (ref <1.0)

## 2024-02-09 LAB — CBG MONITORING, ED: Glucose-Capillary: 102 mg/dL — ABNORMAL HIGH (ref 70–99)

## 2024-02-09 LAB — HEMOGLOBIN A1C
Hgb A1c MFr Bld: 4.2 % — ABNORMAL LOW (ref 4.8–5.6)
Mean Plasma Glucose: 73.84 mg/dL

## 2024-02-09 LAB — PROTIME-INR
INR: 1.1 (ref 0.8–1.2)
Prothrombin Time: 14 s (ref 11.4–15.2)

## 2024-02-09 LAB — MAGNESIUM: Magnesium: 2.4 mg/dL (ref 1.7–2.4)

## 2024-02-09 MED ORDER — TAMSULOSIN HCL 0.4 MG PO CAPS
0.4000 mg | ORAL_CAPSULE | Freq: Every day | ORAL | Status: DC
  Administered 2024-02-09 – 2024-03-16 (×34): 0.4 mg via ORAL
  Filled 2024-02-09 (×35): qty 1

## 2024-02-09 MED ORDER — CLONIDINE HCL 0.1 MG PO TABS
0.1000 mg | ORAL_TABLET | Freq: Three times a day (TID) | ORAL | Status: DC
  Administered 2024-02-09 – 2024-02-16 (×15): 0.1 mg via ORAL
  Filled 2024-02-09 (×19): qty 1

## 2024-02-09 MED ORDER — MEDIHONEY WOUND/BURN DRESSING EX PSTE
1.0000 | PASTE | Freq: Every day | CUTANEOUS | Status: DC
Start: 2024-02-09 — End: 2024-03-07
  Administered 2024-02-10 – 2024-03-06 (×17): 1 via TOPICAL
  Filled 2024-02-09 (×2): qty 44

## 2024-02-09 MED ORDER — AMLODIPINE BESYLATE 10 MG PO TABS
10.0000 mg | ORAL_TABLET | Freq: Every day | ORAL | Status: DC
  Administered 2024-02-09 – 2024-02-16 (×7): 10 mg via ORAL
  Filled 2024-02-09 (×6): qty 1
  Filled 2024-02-09: qty 2

## 2024-02-09 MED ORDER — LEVOTHYROXINE SODIUM 50 MCG PO TABS
50.0000 ug | ORAL_TABLET | Freq: Every day | ORAL | Status: DC
  Administered 2024-02-09 – 2024-03-16 (×36): 50 ug via ORAL
  Filled 2024-02-09 (×3): qty 1
  Filled 2024-02-09: qty 2
  Filled 2024-02-09 (×32): qty 1

## 2024-02-09 MED ORDER — HYDRALAZINE HCL 25 MG PO TABS
25.0000 mg | ORAL_TABLET | Freq: Three times a day (TID) | ORAL | Status: DC
  Administered 2024-02-09 – 2024-02-16 (×17): 25 mg via ORAL
  Filled 2024-02-09 (×19): qty 1

## 2024-02-09 MED ORDER — SEVELAMER CARBONATE 800 MG PO TABS
1600.0000 mg | ORAL_TABLET | Freq: Three times a day (TID) | ORAL | Status: DC
  Administered 2024-02-09 – 2024-03-16 (×87): 1600 mg via ORAL
  Filled 2024-02-09 (×88): qty 2

## 2024-02-09 MED ORDER — SENNOSIDES-DOCUSATE SODIUM 8.6-50 MG PO TABS
4.0000 | ORAL_TABLET | Freq: Two times a day (BID) | ORAL | Status: DC
  Administered 2024-02-09 – 2024-03-16 (×59): 4 via ORAL
  Filled 2024-02-09 (×64): qty 4

## 2024-02-09 MED ORDER — TIZANIDINE HCL 4 MG PO TABS
4.0000 mg | ORAL_TABLET | Freq: Three times a day (TID) | ORAL | Status: DC | PRN
  Administered 2024-02-11 – 2024-03-16 (×25): 4 mg via ORAL
  Filled 2024-02-09 (×33): qty 1

## 2024-02-09 MED ORDER — AMLODIPINE BESYLATE 5 MG PO TABS: 5.0000 mg | ORAL_TABLET | Freq: Every day | ORAL | Status: DC

## 2024-02-09 MED ORDER — BETHANECHOL CHLORIDE 10 MG PO TABS
10.0000 mg | ORAL_TABLET | Freq: Two times a day (BID) | ORAL | Status: DC
  Administered 2024-02-09 – 2024-03-16 (×67): 10 mg via ORAL
  Filled 2024-02-09 (×77): qty 1

## 2024-02-09 MED ORDER — ATORVASTATIN CALCIUM 80 MG PO TABS
80.0000 mg | ORAL_TABLET | Freq: Every day | ORAL | Status: DC
  Administered 2024-02-09 – 2024-03-16 (×33): 80 mg via ORAL
  Filled 2024-02-09 (×34): qty 1

## 2024-02-09 MED ORDER — CHLORHEXIDINE GLUCONATE CLOTH 2 % EX PADS
6.0000 | MEDICATED_PAD | Freq: Every day | CUTANEOUS | Status: DC
  Administered 2024-02-09 – 2024-02-18 (×10): 6 via TOPICAL

## 2024-02-09 MED ORDER — METOPROLOL TARTRATE 50 MG PO TABS
50.0000 mg | ORAL_TABLET | Freq: Two times a day (BID) | ORAL | Status: DC
  Administered 2024-02-09 – 2024-02-16 (×12): 50 mg via ORAL
  Filled 2024-02-09 (×3): qty 1
  Filled 2024-02-09: qty 2
  Filled 2024-02-09 (×9): qty 1

## 2024-02-09 MED ORDER — HEPARIN SODIUM (PORCINE) 1000 UNIT/ML IJ SOLN
INTRAMUSCULAR | Status: AC
  Filled 2024-02-09: qty 4

## 2024-02-09 MED ORDER — HEPARIN SODIUM (PORCINE) 1000 UNIT/ML DIALYSIS
1000.0000 [IU] | INTRAMUSCULAR | Status: DC | PRN
  Administered 2024-02-09: 1000 [IU]

## 2024-02-09 MED ORDER — LOSARTAN POTASSIUM 50 MG PO TABS
50.0000 mg | ORAL_TABLET | Freq: Every day | ORAL | Status: DC
  Administered 2024-02-09 – 2024-02-16 (×8): 50 mg via ORAL
  Filled 2024-02-09 (×9): qty 1

## 2024-02-09 MED ORDER — INSULIN ASPART 100 UNIT/ML IJ SOLN: 0.0000 [IU] | Freq: Three times a day (TID) | INTRAMUSCULAR | Status: DC

## 2024-02-09 NOTE — Progress Notes (Signed)
   02/09/24 1630  Vitals  Temp 97.7 F (36.5 C)  Pulse Rate 81  Resp (!) 21  BP (!) 113/93  SpO2 100 %  O2 Device Room Air  Post Treatment  Dialyzer Clearance Lightly streaked  Hemodialysis Intake (mL) 400 mL  Liters Processed 74.5  Fluid Removed (mL) 2200 mL  Tolerated HD Treatment Yes  Post-Hemodialysis Comments TX TERMINATED 24 MINS EARLY DUE TO SEVERE CRAMPING   Received patient in bed to unit.  Alert and oriented.  Informed consent signed and in chart.   TX duration:3HRS  Patient tolerated well.  Transported back to the room  Alert, without acute distress.  Hand-off given to patient's nurse.   Access used: [facility] Access issues: NONE  Total UF removed: 2.2L Medication(s) given: NONE    [facility]

## 2024-02-09 NOTE — Progress Notes (Signed)
 IR Procedure Request - Disc Aspiration  73 y.o. male inpatient. History of HTN, DN, DVT, PAD,  osteomyelitis of left foot, ESRD on HD. Presented to the ED at [facility] on 4.22.25 with back pain related to a fall. CT CAP from 4.21.25 reads After comparing to CT thoracic spine performed today, the abnormal  changes in the lower thoracic spine include collapse of the T10  vertebral body, lucency around the T10 screws, T9-10 disc space and  T10-11 disc space with abnormal surrounding paraspinal soft tissue  swelling. Findings remain concerning for discitis/osteomyelitis.   Case reviewed with NIR Attending Dr. Luellen Sages after review of the images there appears to be no window for percutaneous access. This was communicated to the Team via EPIC Chat.

## 2024-02-09 NOTE — ED Notes (Signed)
 MRI called to verify if patient will go for scan. Patient declined scan. MD at bedside

## 2024-02-09 NOTE — Progress Notes (Signed)
 Patient left for dialysis.

## 2024-02-09 NOTE — Consult Note (Addendum)
 WOC Nurse Consult Note: Reason for Consult: Requested to assess a left 2nd toe wound. Performed remotely after photo and notes. Wound type: DFU Discard  Pressure Injury POA: NA Measurement: aprox. 2 cm x 1.2 cm Wound bed: 100% cover with scab, yellow. Drainage (amount, consistency, odor) None. No sign of infection, check it with X-ray. (See MD notes) Periwound: intact, dry scabs. Dressing procedure/placement/frequency: Cleanse with Vashe and gauze, do not rinse.  Apply Medihoney on the wound bed, cover with foam dressing, change daily or PRN.  WOC team will not plan to follow further.  Please reconsult if further assistance is needed. Thank-you,  Rachel Budds BSN, RN, [employer], WOC  (Pager: [phone])

## 2024-02-09 NOTE — Plan of Care (Addendum)
 New admit to unit. Did dialysis today and just returned back to the unit.   Problem: Education: Goal: Ability to describe self-care measures that may prevent or decrease complications (Diabetes Survival Skills Education) will improve Outcome: Progressing Goal: Individualized Educational Video(s) Outcome: Progressing   Problem: Coping: Goal: Ability to adjust to condition or change in health will improve Outcome: Progressing   Problem: Fluid Volume: Goal: Ability to maintain a balanced intake and output will improve Outcome: Progressing   Problem: Health Behavior/Discharge Planning: Goal: Ability to identify and utilize available resources and services will improve Outcome: Progressing Goal: Ability to manage health-related needs will improve Outcome: Progressing   Problem: Metabolic: Goal: Ability to maintain appropriate glucose levels will improve Outcome: Progressing   Problem: Nutritional: Goal: Maintenance of adequate nutrition will improve Outcome: Progressing Goal: Progress toward achieving an optimal weight will improve Outcome: Progressing   Problem: Skin Integrity: Goal: Risk for impaired skin integrity will decrease Outcome: Progressing   Problem: Tissue Perfusion: Goal: Adequacy of tissue perfusion will improve Outcome: Progressing   Problem: Education: Goal: Knowledge of General Education information will improve Description: Including pain rating scale, medication(s)/side effects and non-pharmacologic comfort measures Outcome: Progressing   Problem: Health Behavior/Discharge Planning: Goal: Ability to manage health-related needs will improve Outcome: Progressing   Problem: Clinical Measurements: Goal: Ability to maintain clinical measurements within normal limits will improve Outcome: Progressing Goal: Will remain free from infection Outcome: Progressing Goal: Diagnostic test results will improve Outcome: Progressing Goal: Respiratory  complications will improve Outcome: Progressing Goal: Cardiovascular complication will be avoided Outcome: Progressing   Problem: Activity: Goal: Risk for activity intolerance will decrease Outcome: Progressing   Problem: Nutrition: Goal: Adequate nutrition will be maintained Outcome: Progressing   Problem: Coping: Goal: Level of anxiety will decrease Outcome: Progressing   Problem: Elimination: Goal: Will not experience complications related to bowel motility Outcome: Progressing Goal: Will not experience complications related to urinary retention Outcome: Progressing   Problem: Pain Managment: Goal: General experience of comfort will improve and/or be controlled Outcome: Progressing   Problem: Safety: Goal: Ability to remain free from injury will improve Outcome: Progressing   Problem: Skin Integrity: Goal: Risk for impaired skin integrity will decrease Outcome: Progressing

## 2024-02-09 NOTE — Progress Notes (Addendum)
 PROGRESS NOTE  Thomas [name]:[id number] DOB: 08-06-1951   PCP: Wright Heal, MD  Patient is from: Home.  DOA: 02/07/2024 LOS: 1  Chief complaints Chief Complaint  Patient presents with   Fall     Brief Narrative / Interim history: 73 year old M with PMH of ESRD on HD TTS, prior thoracic spine fusion, spinal 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-T12, chronic thoracic osteomyelitis, chronic pain syndrome, gout, PE not on anticoagulation, DM-2, right BKA, left second toe ulcer and HTN presented with low back pain after he had accidental fall at home while fixing his door lock.  Reportedly, he fell backward and hit the back of his head and that his lower back against a furniture.  He denies prodrome leading to this.  In ED, stable vitals.  Labs without significant finding.  Had mild pulm imaging of which CT thoracic spine raise concern for T9-T11 discitis/osteomyelitis.  MRI thoracic spine ordered but patient declined.  Neurosurgery consulted. Neurosurgery recommended holding off antibiotics until they evaluate patient.  Cardiology consulted the next day.  Subjective: Seen and examined earlier this morning.  No major events overnight of this morning.  Continues to endorse upper neck pain and mid to lower back pain.  He rates his pain 8/10.  He denies focal numbness, tingling or weakness.  Denies bowel or bladder habit change.  He reports claustrophobia.  Offered Ativan  for anxiety/claustrophobia with MRI but he adamantly refused MRI stating that he passed out when he had numerous MRI in the past  Objective: Vitals:   02/09/24 0915 02/09/24 1031 02/09/24 1155 02/09/24 1200  BP: (!) 167/81 (!) 167/98 (!) 160/90 (!) 156/95  Pulse:  77  63  Resp: 14 14 16 16   Temp:  98.7 F (37.1 C) 97.6 F (36.4 C)   TempSrc:  Oral Oral   SpO2:  100% 97% 98%  Weight:      Height:        Examination:  GENERAL: No apparent distress.  Nontoxic. HEENT: MMM.  Vision and hearing grossly intact.  NECK: Supple.  No  apparent JVD.  RESP:  No IWOB.  Fair aeration bilaterally. CVS:  RRR. Heart sounds normal.  ABD/GI/GU: BS+. Abd soft, NTND.  MSK/EXT: Right BKA.  Left second toe ulceration.  No sign of infection. SKIN: no apparent skin lesion or wound NEURO: Awake, alert and oriented appropriately.  No apparent focal neuro deficit. PSYCH: Calm. Normal affect.   Consultants:  Neurosurgery Nephrology  Procedures: None  Microbiology summarized: Blood culture pending  Assessment and plan: Possible osteomyelitis/discitis: CT thoracic spine concerning for T10 osteomyelitis and T9-10 and T10-11 discitis.  Patient with prior history of chronic thoracic osteomyelitis and thoracic spinal cord injury s/p T10-T12 fusion.  Exam with tenderness from mid thoracic spine to the lower lumbar spine.  No focal neurodeficit or bowel and bladder habit change.  Unclear how much of the CT finding is acute.  CRP only 1.4 but ESR is elevated to 112.  Has no fever or leukocytosis.  Patient refuses MRI.  -Follow neurosurgery recommendation -Follow blood cultures -Fall precaution -Pain control -PT/OT  Addendum -Neurosurgery recommended IR and ID consult.  Per IR, no window for percutaneous access due to hardware.  ID on board.  Fall at home: Looks mechanical from patient's description.  No prodromes.  Had multiple imaging from head to pelvis.  No acute finding other than concern for thoracic osteomyelitis/discitis. 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  Appears euvolemic on exam.  No respiratory distress.  Does not seem to take Lasix  anymore. - Fluid management by dialysis - Continue home Lasix   Essential hypertension: BP slightly elevated this morning - Continue home clonidine , amlodipine , losartan   History of PE: No longer on anticoagulation.  Does not take warfarin. -  Check INR.  History of chronic urinary tension/BPH - Continue Flomax  and bethanechol   Hyperlipidemia - Continue home statin  Chronic pain syndrome: Reports running out of his OxyContin  at home. - Continue Zanaflex  and Dilaudid  for pain control for now.   Left second toe ulceration: No signs of infection - Foot x-ray to rule out osteomyelitis - Wound care  Hypothyroidism - Continue home Synthroid    Body mass index is 24.78 kg/m.    DVT prophylaxis:  SCDs Start: 02/08/24 2206  Code Status: Full code Family Communication: None at bedside Level of care: Progressive Status is: Inpatient Remains inpatient appropriate because: Possible thoracic osteomyelitis/discitis   Final disposition: Likely home once medically ready   55 minutes with more than 50% spent in reviewing records, counseling patient/family and coordinating care.   Sch Meds:  Scheduled Meds:  amLODipine   10 mg Oral Daily   atorvastatin   80 mg Oral Daily   bethanechol   10 mg Oral BID   Chlorhexidine  Gluconate Cloth  6 each Topical Q0600   cloNIDine   0.1 mg Oral TID   hydrALAZINE   25 mg Oral TID   insulin  aspart  0-6 Units Subcutaneous TID WC   levothyroxine   50 mcg Oral QAC breakfast   lidocaine   1 patch Transdermal Once   losartan   50 mg Oral Daily   metoprolol  tartrate  50 mg Oral BID   senna-docusate  4 tablet Oral BID   sevelamer  carbonate  1,600 mg Oral TID WC   tamsulosin   0.4 mg Oral Daily   Continuous Infusions: PRN Meds:.acetaminophen  **OR** acetaminophen , HYDROmorphone  (DILAUDID ) injection, melatonin, naLOXone  (NARCAN )  injection, ondansetron  (ZOFRAN ) IV, tiZANidine   Antimicrobials: Anti-infectives (From admission, onward)    None        I have personally reviewed the following labs and images: CBC: Recent Labs  Lab 02/08/24 0639 02/08/24 0643 02/09/24 0414  WBC 5.3  --  5.6  NEUTROABS  --   --  3.8  HGB 10.0* 9.9* 10.3*  HCT 32.1* 29.0* 32.5*  MCV 109.6*  --  108.0*   PLT 151  --  169   BMP &GFR Recent Labs  Lab 02/08/24 0639 02/08/24 0643 02/09/24 0406  NA 139 138 138  K 4.8 4.8 5.7*  CL 99 103 101  CO2 24  --  21*  GLUCOSE 80 76 58*  BUN 41* 39* 47*  CREATININE 7.79* 8.40* 8.57*  CALCIUM  8.1*  --  8.0*  MG  --   --  2.4  PHOS  --   --  8.2*   Estimated Creatinine Clearance: 10.1 mL/min (A) (by C-G formula based on SCr of 8.57 mg/dL (H)). Liver & Pancreas: Recent Labs  Lab 02/08/24 0639 02/09/24 0406  AST 22 20  ALT 9 8  ALKPHOS 45 50  BILITOT 0.7 0.9  PROT 8.6* 8.2*  ALBUMIN  3.5 3.4*   No results for input(s): "LIPASE", "AMYLASE" in the last 168 hours. No results for input(s): "AMMONIA" in the last 168 hours. Diabetic: Recent Labs    02/09/24 0414  HGBA1C 4.2*   Recent Labs  Lab 02/09/24 0806  GLUCAP 102*   Cardiac Enzymes: No results for input(s): "CKTOTAL", "CKMB", "CKMBINDEX", "TROPONINI" in the last 168 hours. No results for input(s): "PROBNP" in the last 8760 hours. Coagulation Profile: Recent Labs  Lab 02/08/24 0639  INR 1.1   Thyroid Function Tests: No results for input(s): "TSH", "T4TOTAL", "FREET4", "T3FREE", "THYROIDAB" in the last 72 hours. Lipid Profile: No results for input(s): "CHOL", "HDL", "LDLCALC", "TRIG", "CHOLHDL", "LDLDIRECT" in the last 72 hours. Anemia Panel: Recent Labs    02/09/24 0406  VITAMINB12 1,297*  FOLATE 15.9   Urine analysis:    Component Value Date/Time   COLORURINE YELLOW 01/14/2022 0248   APPEARANCEUR HAZY (A) 01/14/2022 0248   LABSPEC 1.009 01/14/2022 0248   PHURINE 7.0 01/14/2022 0248   GLUCOSEU NEGATIVE 01/14/2022 0248   HGBUR MODERATE (A) 01/14/2022 0248   BILIRUBINUR NEGATIVE 01/14/2022 0248   KETONESUR NEGATIVE 01/14/2022 0248   PROTEINUR 100 (A) 01/14/2022 0248   NITRITE NEGATIVE 01/14/2022 0248   LEUKOCYTESUR LARGE (A) 01/14/2022 0248   Sepsis Labs: Invalid input(s): "PROCALCITONIN", "LACTICIDVEN"  Microbiology: No results found for this or any  previous visit (from the past 240 hours).  Radiology Studies: CT CHEST ABDOMEN PELVIS WO CONTRAST Addendum Date: 02/08/2024 ADDENDUM REPORT: 02/08/2024 14:27 ADDENDUM: After comparing to CT thoracic spine performed today, the abnormal changes in the lower thoracic spine include collapse of the T10 vertebral body, lucency around the T10 screws, T9-10 disc space and T10-11 disc space with abnormal surrounding paraspinal soft tissue swelling. Findings remain concerning for discitis/osteomyelitis. Electronically Signed   By: Janeece Mechanic M.D.   On: 02/08/2024 14:27   Result Date: 02/08/2024 CLINICAL DATA:  Polytrauma, blunt.  Fall. EXAM: CT CHEST, ABDOMEN AND PELVIS WITHOUT CONTRAST TECHNIQUE: Multidetector CT imaging of the chest, abdomen and pelvis was performed following the standard protocol without IV contrast. RADIATION DOSE REDUCTION: This exam was performed according to the departmental dose-optimization program which includes automated exposure control, adjustment of the mA and/or kV according to patient size and/or use of iterative reconstruction technique. COMPARISON:  01/12/2024 FINDINGS: CT CHEST FINDINGS Cardiovascular: Scattered coronary artery and aortic calcifications. Heart is normal size. Aorta is normal caliber. Mediastinum/Nodes: No mediastinal, hilar, or axillary adenopathy. Trachea and esophagus are unremarkable. Thyroid unremarkable. Lungs/Pleura: Chronic changes in the lungs with areas of scarring bilaterally. No effusions or pneumothorax. Musculoskeletal: Postoperative changes with posterior spinal hardware at T10, T12 and extending through the lumbar spine. Stable lucency noted around the posterior spinal screws at T10 and at the T10-11 disc space. There is abnormal paraspinal soft tissue extending from T9-10 through T10-11. Appearance is concerning for possible with discitis/osteomyelitis. CT ABDOMEN PELVIS FINDINGS Hepatobiliary: No focal hepatic abnormality. Gallbladder unremarkable.  Pancreas: No focal abnormality or ductal dilatation. Spleen: No focal abnormality.  Normal size. Adrenals/Urinary Tract: Adrenal glands normal. Severe chronic right hydronephrosis is stable when compared to prior study. Overlying cortical thinning. No hydronephrosis on the left. Urinary bladder unremarkable. Stomach/Bowel: Normal appendix. Stomach, large and small bowel grossly unremarkable. Vascular/Lymphatic: No evidence of aneurysm or adenopathy. Aortic atherosclerosis. Infrarenal IVC filter noted. Reproductive: No visible focal abnormality. Other: No free fluid or free air. Musculoskeletal: Postoperative changes in the lumbar spine with severe degenerative changes throughout. Mild compression fracture at L1 is stable. Severe diffuse osteopenia. IMPRESSION: No acute traumatic injury in the chest, abdomen or pelvis. Abnormal appearance at T10 and T10-11 with bone destruction surrounding the posterior pedicle screws at T10 and surrounding the T10-11 disc space. Adjacent abnormal paraspinal soft tissues.  Appearance is concerning for possible discitis/osteomyelitis. Consider further evaluation with MRI. Chronic changes in the lungs with areas of scarring throughout. Coronary artery disease, aortic atherosclerosis. Severe chronic right hydronephrosis is stable since prior study. Electronically Signed: By: Janeece Mechanic M.D. On: 02/08/2024 14:10   CT Cervical Spine Wo Contrast Result Date: 02/08/2024 CLINICAL DATA:  Neck trauma (Age >= 65y).  Fall EXAM: CT CERVICAL SPINE WITHOUT CONTRAST TECHNIQUE: Multidetector CT imaging of the cervical spine was performed without intravenous contrast. Multiplanar CT image reconstructions were also generated. RADIATION DOSE REDUCTION: This exam was performed according to the departmental dose-optimization program which includes automated exposure control, adjustment of the mA and/or kV according to patient size and/or use of iterative reconstruction technique. COMPARISON:   02/05/2024 FINDINGS: Alignment: Degenerative anterolisthesis in the lower cervical spine, stable. Skull base and vertebrae: No acute fracture. Mild irregularity and collapse of the C5, C6 and C7 vertebral bodies is stable since prior study and likely related to advanced degenerative changes. Soft tissues and spinal canal: No prevertebral fluid or swelling. No visible canal hematoma. Disc levels: Advanced degenerative disc disease at C5-6 through C7-T1 with endplate irregularity and collapse noted at C5, C6 and C7 likely related to advanced degenerative changes. This is stable since prior study. Upper chest: No acute findings Other: None IMPRESSION: No acute bony abnormality. Advanced degenerative disc disease at C5-6 through C7-T1. Electronically Signed   By: Janeece Mechanic M.D.   On: 02/08/2024 14:24   CT Head Wo Contrast Result Date: 02/08/2024 CLINICAL DATA:  Head trauma, minor (Age >= 65y).  Fall. EXAM: CT HEAD WITHOUT CONTRAST TECHNIQUE: Contiguous axial images were obtained from the base of the skull through the vertex without intravenous contrast. RADIATION DOSE REDUCTION: This exam was performed according to the departmental dose-optimization program which includes automated exposure control, adjustment of the mA and/or kV according to patient size and/or use of iterative reconstruction technique. COMPARISON:  12/22/2023 FINDINGS: Brain: Diffuse cerebral atrophy. No acute intracranial abnormality. Specifically, no hemorrhage, hydrocephalus, mass lesion, acute infarction, or significant intracranial injury. Vascular: No hyperdense vessel or unexpected calcification. Skull: No acute calvarial abnormality. Sinuses/Orbits: No acute findings Other: None IMPRESSION: Atrophy.  No acute intracranial abnormality. Electronically Signed   By: Janeece Mechanic M.D.   On: 02/08/2024 14:21   CT T-SPINE NO CHARGE Result Date: 02/08/2024 CLINICAL DATA:  Fall, trauma EXAM: CT THORACIC SPINE WITHOUT CONTRAST TECHNIQUE:  Multidetector CT images of the thoracic were obtained using the standard protocol without intravenous contrast. RADIATION DOSE REDUCTION: This exam was performed according to the departmental dose-optimization program which includes automated exposure control, adjustment of the mA and/or kV according to patient size and/or use of iterative reconstruction technique. COMPARISON:  Thoracic MRI 09/17/2021 FINDINGS: Alignment: Normal Vertebrae: Postoperative changes from posterior fusion from T10-T12. There is collapse of the T10 vertebral body with abnormal lucency around the T10 screws, the T9-10 disc base and T10-11 disc space. Surrounding paraspinal soft tissue swelling. Appearance is concerning for discitis/osteomyelitis. Paraspinal and other soft tissues: Abnormal paraspinal soft tissue from T9-T11. Disc levels: Advanced degenerative changes in the lower thoracic spine and lower cervical spine. IMPRESSION: No acute traumatic findings. Prior posterior fusion from T10-T12. Abnormal appearance with collapse of the T10 vertebral body, lucency around the T10 screws, at the T9-10 disc space and T10-11 disc space concerning for discitis/osteomyelitis. Abnormal surrounding paraspinal soft tissue swelling. Electronically Signed   By: Janeece Mechanic M.D.   On: 02/08/2024 14:19   CT L-SPINE NO CHARGE Result Date:  02/08/2024 CLINICAL DATA:  Fall, trauma EXAM: CT LUMBAR SPINE WITHOUT CONTRAST TECHNIQUE: Multidetector CT imaging of the lumbar spine was performed without intravenous contrast administration. Multiplanar CT image reconstructions were also generated. RADIATION DOSE REDUCTION: This exam was performed according to the departmental dose-optimization program which includes automated exposure control, adjustment of the mA and/or kV according to patient size and/or use of iterative reconstruction technique. COMPARISON:  CT abdomen and pelvis 01/01/2024. FINDINGS: Segmentation: 5 lumbar type vertebrae. Alignment: No  subluxation Vertebrae: Severe diffuse osteopenia. Mild chronic compression fracture at L1 is stable since prior study. Postoperative changes from posterior fusion. Advanced degenerative disc and facet disease. Fusion of the facets diffusely. Paraspinal and other soft tissues: No paraspinal soft tissue abnormality in the lumbar spine. Disc levels: Severe diffuse degenerative disc disease and facet disease. Fusion across the facet joints diffusely. IMPRESSION: No acute bony abnormality. Mild chronic compression fracture at L1. Severe osteopenia. Posterior fusion changes throughout the lumbar spine and extending into the lower thoracic spine. Severe diffuse degenerative disc and facet disease with fusion across the facet joints. Electronically Signed   By: Janeece Mechanic M.D.   On: 02/08/2024 14:16      Anayansi Rundquist T. Drianna Chandran [facility]  If 7PM-7AM, please contact night-coverage [email or web address] 02/09/2024, 12:19 PM

## 2024-02-09 NOTE — Progress Notes (Signed)
 Patient returned to unit from dialysis. 2.2 L taken off per dialysis RN.

## 2024-02-09 NOTE — Consult Note (Addendum)
 [facility] Renal Consultation Note    Indication for Consultation:  Management of ESRD/hemodialysis, anemia, hypertension/volume, and secondary hyperparathyroidism. PCP:  HPI: Thomas [name] is a 73 y.o. male with ESRD, HTN, T2DM, Hx DVT (on warfarin), PAD (s/p R BKA), BPH/Hx urinary retention. S/p many recent [facility] admits, including fairly recent admit for L foot osteomyelitis (where amputation was recommended and he refused and then was on course of IV Daptomycin /Cefepime  at HD) who is now being admitted s/p fall with back pain - imaging concerning for thoracic discitis.  Brought to ED overnight 4/20-4/21 s/p fall with complaints of neck and back pain. CT imaging of spine performed which showed area of concern in lower thoracic area for possible osteomyelitis. Hx prior fusion in that area as well - T10-T12. Neurosurgery consulted, MRI recommended but patient refused. IR aspiration recommended.  Seen in room today - endorses mild CP and dyspnea. No fever or chills. No abd pain, N/V/D. He is hungry.  Dialyzes on TTS schedule at [facility] clinic. He is a quirky gentleman, difficult to get history and never knows his medications at baseline. Has refused SNF in past.    Past Medical History:  Diagnosis Date   Arthritis    BPH (benign prostatic hyperplasia) 11/24/2019   Chronic pain syndrome 11/24/2019   Diabetes mellitus without complication (HCC)    ESRD (end stage renal disease) (HCC)    on HD   Gout    Hypertension    Muscle pain    Physical deconditioning 11/24/2019   Scars    Swelling    Past Surgical History:  Procedure Laterality Date   BELOW KNEE LEG AMPUTATION Right    ESOPHAGOGASTRODUODENOSCOPY (EGD) WITH PROPOFOL  N/A 11/25/2019   Procedure: ESOPHAGOGASTRODUODENOSCOPY (EGD) WITH PROPOFOL ;  Surgeon: Alvis Jourdain, MD;  Location: [facility] ENDOSCOPY;  Service: Endoscopy;  Laterality: N/A;   IR FLUORO GUIDE CV LINE RIGHT  09/05/2019   IR LUMBAR DISC  ASPIRATION W/IMG GUIDE  11/26/2019   IR US  GUIDE VASC ACCESS RIGHT  09/05/2019   RADIOLOGY WITH ANESTHESIA N/A 11/25/2019   Procedure: Thoracic and Lumbar MRI WITH ANESTHESIA;  Surgeon: Radiologist, Medication, MD;  Location: [facility] OR;  Service: Radiology;  Laterality: N/A;   Family History  Problem Relation Age of Onset   Heart disease Mother    Cancer Father    Social History:  reports that he has never smoked. He has never been exposed to tobacco smoke. He has never used smokeless tobacco. He reports that he does not drink alcohol and does not use drugs.  ROS: As per HPI otherwise negative.  Physical Exam: Vitals:   02/09/24 0300 02/09/24 0454 02/09/24 0515 02/09/24 0915  BP: (!) 182/98 (!) 167/117 (!) 188/98 (!) 167/81  Pulse: 60 69    Resp: 17 14 (!) 27 14  Temp: (!) 97.5 F (36.4 C) 99 F (37.2 C)    TempSrc: Axillary Oral    SpO2: 97% 98%    Weight:      Height:         General: Well developed, well nourished, in no acute distress. Room air. Head: Normocephalic, atraumatic, sclera non-icteric, mucus membranes are moist. Neck: Supple without lymphadenopathy/masses. JVD not elevated. Lungs: Clear bilaterally to auscultation without wheezes, rales, or rhonchi. Breathing is unlabored. Heart: RRR with normal S1, S2. No murmurs, rubs, or gallops appreciated. Abdomen: Soft, non-tender, non-distended with normoactive bowel sounds.  Musculoskeletal:  Strength and tone appear normal for age. Lower extremities: R BKA, L 2nd toe  with abrasion v. infeciton Neuro: Alert and oriented X 3. Moves all extremities spontaneously. Psych:  Responds to questions appropriately with a normal affect. Dialysis Access: [facility]  Allergies  Allergen Reactions   Other Other (See Comments)    Blood pressure issues Per [facility]: Pt states he can only take these pain meds or else he gets very sick: Oxycontin ,morphine ,demerol, and dilaudid  are the only pain meds pt states he can take.      Oxymorphone Other (See Comments)    Causes kidney problems   Amitriptyline Other (See Comments)   Aspirin Nausea And Vomiting    Gi upset, has kidney issues   Beta Vulgaris Nausea And Vomiting   Buspirone Other (See Comments)    Affected his head    Cabbage Nausea And Vomiting   Codeine Other (See Comments)    Pt does not remember the reaction but knows he can not take    Cyclobenzaprine Nausea And Vomiting   Diflunisal Nausea And Vomiting and Other (See Comments)   Fish Allergy Nausea And Vomiting   Fish-Derived Products Nausea And Vomiting   Hydroxyzine Nausea And Vomiting   Levetiracetam Other (See Comments)   Methadone Other (See Comments)    Made him loopy   Nalbuphine Other (See Comments)    Cutaneous eruption (morphologic abnormality)   Pentazocine Other (See Comments)    Pt does not remember the reaction    Propoxyphene Other (See Comments)    Pt does not remember the reaction     Shellfish Allergy Nausea And Vomiting   Sulfa Antibiotics Hives   Sulfasalazine Other (See Comments)    Pt does not remember the reaction   Amoxicillin  Nausea And Vomiting and Rash    Per [facility]   Prior to Admission medications   Medication Sig Start Date End Date Taking? Authorizing Provider  amLODipine  (NORVASC ) 10 MG tablet Take 10 mg by mouth daily. 01/25/24  Yes [provider]  atorvastatin  (LIPITOR ) 80 MG tablet Take 80 mg by mouth daily.   Yes [provider]  bethanechol  (URECHOLINE ) 10 MG tablet Take 10 mg by mouth 2 (two) times daily. 12/21/23  Yes [provider]  Cholecalciferol  125 MCG (5000 UT) capsule Take 5,000 Units by mouth daily.   Yes [provider]  cloNIDine  (CATAPRES ) 0.1 MG tablet Take 0.1 mg by mouth 3 (three) times daily as needed.   Yes [provider]  cyanocobalamin  (VITAMIN B12) 1000 MCG tablet Take 1 tablet by mouth daily.   Yes [provider]  hydrALAZINE  (APRESOLINE ) 25 MG tablet Take 25 mg by  mouth 3 (three) times daily.   Yes [provider]  levothyroxine  (SYNTHROID ) 50 MCG tablet Take 50 mcg by mouth daily before breakfast.    Yes [provider]  losartan  (COZAAR ) 50 MG tablet Take 50 mg by mouth at bedtime. 11/11/23  Yes [provider]  magnesium  oxide (MAG-OX) 400 MG tablet Take 800 mg by mouth in the morning, at noon, and at bedtime.   Yes [provider]  metoprolol  tartrate (LOPRESSOR ) 50 MG tablet Take 1 tablet (50 mg total) by mouth 2 (two) times daily. Patient taking differently: Take 50 mg by mouth 2 (two) times daily as needed (Hypertension). 12/24/21  Yes Samtani, Jai-Gurmukh, MD  OXYCONTIN  30 MG 12 hr tablet Take 30 mg by mouth 3 (three) times daily as needed (pain). 12/30/21  Yes [provider]  senna-docusate (SENOKOT-S) 8.6-50 MG tablet Take 4 tablets by mouth 2 (two)  times daily. 11/04/22  Yes [provider]  sevelamer  (RENAGEL ) 800 MG tablet Take 1,600 mg by mouth 3 (three) times daily. 08/27/23  Yes [provider]  tamsulosin  (FLOMAX ) 0.4 MG CAPS capsule Take 1 capsule (0.4 mg total) by mouth daily. 09/25/20  Yes Avi Body, MD  tiZANidine  (ZANAFLEX ) 4 MG tablet Take 4 mg by mouth every 8 (eight) hours as needed for muscle spasms. 11/26/21  Yes [provider]  Vitamin E 670 MG (1000 UT) CAPS Take 1,000 mg by mouth daily.   Yes [provider]  furosemide  (LASIX ) 40 MG tablet Take 40 mg by mouth daily. 11/26/23   [provider]  warfarin (COUMADIN ) 10 MG tablet Take 10 mg by mouth daily. Patient not taking: Reported on 02/09/2024    [provider]   Current Facility-Administered Medications  Medication Dose Route Frequency Provider Last Rate Last Admin   acetaminophen  (TYLENOL ) tablet 650 mg  650 mg Oral Q6H PRN Howerter, Justin B, DO       Or   acetaminophen  (TYLENOL ) suppository 650 mg  650 mg Rectal Q6H PRN Howerter, Justin B, DO       amLODipine  (NORVASC ) tablet  10 mg  10 mg Oral Daily Gonfa, Taye T, MD       atorvastatin  (LIPITOR ) tablet 80 mg  80 mg Oral Daily Howerter, Justin B, DO       bethanechol  (URECHOLINE ) tablet 10 mg  10 mg Oral BID Gonfa, Taye T, MD       cloNIDine  (CATAPRES ) tablet 0.1 mg  0.1 mg Oral TID Howerter, Justin B, DO       hydrALAZINE  (APRESOLINE ) tablet 25 mg  25 mg Oral TID Howerter, Justin B, DO       HYDROmorphone  (DILAUDID ) injection 1 mg  1 mg Intravenous Q2H PRN Howerter, Justin B, DO   1 mg at 02/09/24 0453   insulin  aspart (novoLOG ) injection 0-6 Units  0-6 Units Subcutaneous TID WC Howerter, Justin B, DO       levothyroxine  (SYNTHROID ) tablet 50 mcg  50 mcg Oral QAC breakfast Howerter, Justin B, DO   50 mcg at 02/09/24 0981   lidocaine  (LIDODERM ) 5 % 1 patch  1 patch Transdermal Once Russella Courts A, DO       losartan  (COZAAR ) tablet 50 mg  50 mg Oral Daily Gonfa, Taye T, MD       melatonin tablet 3 mg  3 mg Oral QHS PRN Howerter, Justin B, DO       metoprolol  tartrate (LOPRESSOR ) tablet 50 mg  50 mg Oral BID Gonfa, Taye T, MD       naloxone  (NARCAN ) injection 0.4 mg  0.4 mg Intravenous PRN Howerter, Justin B, DO       ondansetron  (ZOFRAN ) injection 4 mg  4 mg Intravenous Q6H PRN Howerter, Justin B, DO       senna-docusate (Senokot-S) tablet 4 tablet  4 tablet Oral BID Howerter, Justin B, DO       sevelamer  carbonate (RENVELA ) tablet 1,600 mg  1,600 mg Oral TID WC Howerter, Justin B, DO   1,600 mg at 02/09/24 1914   tamsulosin  (FLOMAX ) capsule 0.4 mg  0.4 mg Oral Daily Gonfa, Taye T, MD       tiZANidine  (ZANAFLEX ) tablet 4 mg  4 mg Oral Q8H PRN Howerter, Justin B, DO       Current Outpatient Medications  Medication Sig Dispense Refill   amLODipine  (NORVASC ) 10 MG tablet Take 10 mg by  mouth daily.     atorvastatin  (LIPITOR ) 80 MG tablet Take 80 mg by mouth daily.     bethanechol  (URECHOLINE ) 10 MG tablet Take 10 mg by mouth 2 (two) times daily.     Cholecalciferol  125 MCG (5000 UT) capsule Take 5,000 Units by mouth  daily.     cloNIDine  (CATAPRES ) 0.1 MG tablet Take 0.1 mg by mouth 3 (three) times daily as needed.     cyanocobalamin  (VITAMIN B12) 1000 MCG tablet Take 1 tablet by mouth daily.     hydrALAZINE  (APRESOLINE ) 25 MG tablet Take 25 mg by mouth 3 (three) times daily.     levothyroxine  (SYNTHROID ) 50 MCG tablet Take 50 mcg by mouth daily before breakfast.      losartan  (COZAAR ) 50 MG tablet Take 50 mg by mouth at bedtime.     magnesium  oxide (MAG-OX) 400 MG tablet Take 800 mg by mouth in the morning, at noon, and at bedtime.     metoprolol  tartrate (LOPRESSOR ) 50 MG tablet Take 1 tablet (50 mg total) by mouth 2 (two) times daily. (Patient taking differently: Take 50 mg by mouth 2 (two) times daily as needed (Hypertension).) 60 tablet 1   OXYCONTIN  30 MG 12 hr tablet Take 30 mg by mouth 3 (three) times daily as needed (pain).     senna-docusate (SENOKOT-S) 8.6-50 MG tablet Take 4 tablets by mouth 2 (two) times daily.     sevelamer  (RENAGEL ) 800 MG tablet Take 1,600 mg by mouth 3 (three) times daily.     tamsulosin  (FLOMAX ) 0.4 MG CAPS capsule Take 1 capsule (0.4 mg total) by mouth daily. 30 capsule 0   tiZANidine  (ZANAFLEX ) 4 MG tablet Take 4 mg by mouth every 8 (eight) hours as needed for muscle spasms.     Vitamin E 670 MG (1000 UT) CAPS Take 1,000 mg by mouth daily.     furosemide  (LASIX ) 40 MG tablet Take 40 mg by mouth daily.     warfarin (COUMADIN ) 10 MG tablet Take 10 mg by mouth daily. (Patient not taking: Reported on 02/09/2024)     Labs: Basic Metabolic Panel: Recent Labs  Lab 02/08/24 0639 02/08/24 0643 02/09/24 0406  NA 139 138 138  K 4.8 4.8 5.7*  CL 99 103 101  CO2 24  --  21*  GLUCOSE 80 76 58*  BUN 41* 39* 47*  CREATININE 7.79* 8.40* 8.57*  CALCIUM  8.1*  --  8.0*  PHOS  --   --  8.2*   Liver Function Tests: Recent Labs  Lab 02/08/24 0639 02/09/24 0406  AST 22 20  ALT 9 8  ALKPHOS 45 50  BILITOT 0.7 0.9  PROT 8.6* 8.2*  ALBUMIN  3.5 3.4*   CBC: Recent Labs  Lab  02/08/24 0639 02/08/24 0643 02/09/24 0414  WBC 5.3  --  5.6  NEUTROABS  --   --  3.8  HGB 10.0* 9.9* 10.3*  HCT 32.1* 29.0* 32.5*  MCV 109.6*  --  108.0*  PLT 151  --  169   CBG: Recent Labs  Lab 02/09/24 0806  GLUCAP 102*   Studies/Results: CT CHEST ABDOMEN PELVIS WO CONTRAST Addendum Date: 02/08/2024 ADDENDUM REPORT: 02/08/2024 14:27 ADDENDUM: After comparing to CT thoracic spine performed today, the abnormal changes in the lower thoracic spine include collapse of the T10 vertebral body, lucency around the T10 screws, T9-10 disc space and T10-11 disc space with abnormal surrounding paraspinal soft tissue swelling. Findings remain concerning for discitis/osteomyelitis. Electronically Signed   By: Ernestina Headland  Dover M.D.   On: 02/08/2024 14:27   Result Date: 02/08/2024 CLINICAL DATA:  Polytrauma, blunt.  Fall. EXAM: CT CHEST, ABDOMEN AND PELVIS WITHOUT CONTRAST TECHNIQUE: Multidetector CT imaging of the chest, abdomen and pelvis was performed following the standard protocol without IV contrast. RADIATION DOSE REDUCTION: This exam was performed according to the departmental dose-optimization program which includes automated exposure control, adjustment of the mA and/or kV according to patient size and/or use of iterative reconstruction technique. COMPARISON:  01/12/2024 FINDINGS: CT CHEST FINDINGS Cardiovascular: Scattered coronary artery and aortic calcifications. Heart is normal size. Aorta is normal caliber. Mediastinum/Nodes: No mediastinal, hilar, or axillary adenopathy. Trachea and esophagus are unremarkable. Thyroid unremarkable. Lungs/Pleura: Chronic changes in the lungs with areas of scarring bilaterally. No effusions or pneumothorax. Musculoskeletal: Postoperative changes with posterior spinal hardware at T10, T12 and extending through the lumbar spine. Stable lucency noted around the posterior spinal screws at T10 and at the T10-11 disc space. There is abnormal paraspinal soft tissue  extending from T9-10 through T10-11. Appearance is concerning for possible with discitis/osteomyelitis. CT ABDOMEN PELVIS FINDINGS Hepatobiliary: No focal hepatic abnormality. Gallbladder unremarkable. Pancreas: No focal abnormality or ductal dilatation. Spleen: No focal abnormality.  Normal size. Adrenals/Urinary Tract: Adrenal glands normal. Severe chronic right hydronephrosis is stable when compared to prior study. Overlying cortical thinning. No hydronephrosis on the left. Urinary bladder unremarkable. Stomach/Bowel: Normal appendix. Stomach, large and small bowel grossly unremarkable. Vascular/Lymphatic: No evidence of aneurysm or adenopathy. Aortic atherosclerosis. Infrarenal IVC filter noted. Reproductive: No visible focal abnormality. Other: No free fluid or free air. Musculoskeletal: Postoperative changes in the lumbar spine with severe degenerative changes throughout. Mild compression fracture at L1 is stable. Severe diffuse osteopenia. IMPRESSION: No acute traumatic injury in the chest, abdomen or pelvis. Abnormal appearance at T10 and T10-11 with bone destruction surrounding the posterior pedicle screws at T10 and surrounding the T10-11 disc space. Adjacent abnormal paraspinal soft tissues. Appearance is concerning for possible discitis/osteomyelitis. Consider further evaluation with MRI. Chronic changes in the lungs with areas of scarring throughout. Coronary artery disease, aortic atherosclerosis. Severe chronic right hydronephrosis is stable since prior study. Electronically Signed: By: Janeece Mechanic M.D. On: 02/08/2024 14:10   CT Cervical Spine Wo Contrast Result Date: 02/08/2024 CLINICAL DATA:  Neck trauma (Age >= 65y).  Fall EXAM: CT CERVICAL SPINE WITHOUT CONTRAST TECHNIQUE: Multidetector CT imaging of the cervical spine was performed without intravenous contrast. Multiplanar CT image reconstructions were also generated. RADIATION DOSE REDUCTION: This exam was performed according to the  departmental dose-optimization program which includes automated exposure control, adjustment of the mA and/or kV according to patient size and/or use of iterative reconstruction technique. COMPARISON:  02/05/2024 FINDINGS: Alignment: Degenerative anterolisthesis in the lower cervical spine, stable. Skull base and vertebrae: No acute fracture. Mild irregularity and collapse of the C5, C6 and C7 vertebral bodies is stable since prior study and likely related to advanced degenerative changes. Soft tissues and spinal canal: No prevertebral fluid or swelling. No visible canal hematoma. Disc levels: Advanced degenerative disc disease at C5-6 through C7-T1 with endplate irregularity and collapse noted at C5, C6 and C7 likely related to advanced degenerative changes. This is stable since prior study. Upper chest: No acute findings Other: None IMPRESSION: No acute bony abnormality. Advanced degenerative disc disease at C5-6 through C7-T1. Electronically Signed   By: Janeece Mechanic M.D.   On: 02/08/2024 14:24   CT Head Wo Contrast Result Date: 02/08/2024 CLINICAL DATA:  Head trauma, minor (Age >= 65y).  Fall.  EXAM: CT HEAD WITHOUT CONTRAST TECHNIQUE: Contiguous axial images were obtained from the base of the skull through the vertex without intravenous contrast. RADIATION DOSE REDUCTION: This exam was performed according to the departmental dose-optimization program which includes automated exposure control, adjustment of the mA and/or kV according to patient size and/or use of iterative reconstruction technique. COMPARISON:  12/22/2023 FINDINGS: Brain: Diffuse cerebral atrophy. No acute intracranial abnormality. Specifically, no hemorrhage, hydrocephalus, mass lesion, acute infarction, or significant intracranial injury. Vascular: No hyperdense vessel or unexpected calcification. Skull: No acute calvarial abnormality. Sinuses/Orbits: No acute findings Other: None IMPRESSION: Atrophy.  No acute intracranial abnormality.  Electronically Signed   By: Janeece Mechanic M.D.   On: 02/08/2024 14:21   CT T-SPINE NO CHARGE Result Date: 02/08/2024 CLINICAL DATA:  Fall, trauma EXAM: CT THORACIC SPINE WITHOUT CONTRAST TECHNIQUE: Multidetector CT images of the thoracic were obtained using the standard protocol without intravenous contrast. RADIATION DOSE REDUCTION: This exam was performed according to the departmental dose-optimization program which includes automated exposure control, adjustment of the mA and/or kV according to patient size and/or use of iterative reconstruction technique. COMPARISON:  Thoracic MRI 09/17/2021 FINDINGS: Alignment: Normal Vertebrae: Postoperative changes from posterior fusion from T10-T12. There is collapse of the T10 vertebral body with abnormal lucency around the T10 screws, the T9-10 disc base and T10-11 disc space. Surrounding paraspinal soft tissue swelling. Appearance is concerning for discitis/osteomyelitis. Paraspinal and other soft tissues: Abnormal paraspinal soft tissue from T9-T11. Disc levels: Advanced degenerative changes in the lower thoracic spine and lower cervical spine. IMPRESSION: No acute traumatic findings. Prior posterior fusion from T10-T12. Abnormal appearance with collapse of the T10 vertebral body, lucency around the T10 screws, at the T9-10 disc space and T10-11 disc space concerning for discitis/osteomyelitis. Abnormal surrounding paraspinal soft tissue swelling. Electronically Signed   By: Janeece Mechanic M.D.   On: 02/08/2024 14:19   CT L-SPINE NO CHARGE Result Date: 02/08/2024 CLINICAL DATA:  Fall, trauma EXAM: CT LUMBAR SPINE WITHOUT CONTRAST TECHNIQUE: Multidetector CT imaging of the lumbar spine was performed without intravenous contrast administration. Multiplanar CT image reconstructions were also generated. RADIATION DOSE REDUCTION: This exam was performed according to the departmental dose-optimization program which includes automated exposure control, adjustment of the mA  and/or kV according to patient size and/or use of iterative reconstruction technique. COMPARISON:  CT abdomen and pelvis 01/01/2024. FINDINGS: Segmentation: 5 lumbar type vertebrae. Alignment: No subluxation Vertebrae: Severe diffuse osteopenia. Mild chronic compression fracture at L1 is stable since prior study. Postoperative changes from posterior fusion. Advanced degenerative disc and facet disease. Fusion of the facets diffusely. Paraspinal and other soft tissues: No paraspinal soft tissue abnormality in the lumbar spine. Disc levels: Severe diffuse degenerative disc disease and facet disease. Fusion across the facet joints diffusely. IMPRESSION: No acute bony abnormality. Mild chronic compression fracture at L1. Severe osteopenia. Posterior fusion changes throughout the lumbar spine and extending into the lower thoracic spine. Severe diffuse degenerative disc and facet disease with fusion across the facet joints. Electronically Signed   By: Janeece Mechanic M.D.   On: 02/08/2024 14:16   DG Wrist Complete Left Result Date: 02/08/2024 CLINICAL DATA:  Fall. EXAM: LEFT WRIST - COMPLETE 3+ VIEW COMPARISON:  None Available. FINDINGS: There is 2 mm ulnar negative variance. Severe radiocarpal joint space narrowing with radius-scaphoid bone-on-bone contact. Mild 1 mm chronic impression/erosion by the scaphoid on the distal radius. 10 mm lucent cyst within the distal lateral aspect of the radius and 11 mm lucent cyst within the  distal ulna, possibly degenerative. Severe triscaphe joint space narrowing, subchondral sclerosis, and peripheral osteophytosis. Moderate to severe thumb carpometacarpal joint space narrowing and peripheral osteophytosis. No acute fracture is seen.  No dislocation. IMPRESSION: 1. No acute fracture is seen. 2. Severe radiocarpal and triscaphe osteoarthritis. 3. Moderate to severe thumb carpometacarpal osteoarthritis. Electronically Signed   By: Bertina Broccoli M.D.   On: 02/08/2024 08:42   DG Elbow  Complete Right Result Date: 02/08/2024 CLINICAL DATA:  Right elbow pain.  Fall. EXAM: RIGHT ELBOW - COMPLETE 3+ VIEW COMPARISON:  None Available. FINDINGS: There is diffuse decreased bone mineralization. Normal position of the distal anterior humeral fat pad without evidence of elbow joint effusion. Mild peripheral medial coronoid process degenerative spurring at the medial aspect of the elbow. Mild degenerative spurring at the volar tip of the coronoid process and the posterior volar tip of the olecranon. There is mild to moderate thickening of the olecranon bursa, measuring up to approximately 12 mm in AP dimension. There are numerous small calcific density seen throughout the olecranon bursa measuring up to approximately 3.9 cm in craniocaudal dimension and 1.2 cm in AP dimension. IMPRESSION: 1. Mild to moderate thickening of the olecranon bursa with numerous small calcific densities throughout the olecranon bursa. 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 2. No acute fracture is seen. Electronically Signed   By: Bertina Broccoli M.D.   On: 02/08/2024 08:40   DG Shoulder Left Result Date: 02/08/2024 CLINICAL DATA:  Fall. EXAM: LEFT SHOULDER - 2+ VIEW COMPARISON:  None Available. FINDINGS: There is diffuse decreased bone mineralization. Mild inferior glenohumeral joint space narrowing and inferior glenoid and humeral head-neck junction degenerative spurring. Mild degenerative spurring at the superior aspect of the acromioclavicular joint. There are multiple linear calcific densities overlying the bilateral, mid to inferior and mid to posterior aspect of the shoulder, possibly within the region of the deltoid musculature, nonspecific and chronic. No acute fracture is seen. No dislocation. IMPRESSION: 1. Mild glenohumeral and acromioclavicular osteoarthritis. 2. No acute fracture is seen. Electronically Signed   By: Bertina Broccoli M.D.   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 CT imaging concerning for thoracic discitis. Has refused MRI. Ordered for disc aspiration with IR. He is s/p several recent admits at [facility] - I am unable to see the d/c summaries in his HD records - finished 8 week course on 01/07/24 of Dapto and Cefepime  for bacteremic episode, attributed to foot infection.   ESRD:  Usual TTS schedule - for HD today.  Hypertension/volume: BP high, some LE edema. UF as tolerated  Anemia: HGB 10.3 - not due for ESA yet.  Metabolic bone disease: Ca ok, Phos high - resume home binders.  Nutrition: Alb 3.4, follow.  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  Janus Mercury, PA-C 02/09/2024, 9:27 AM  [employer]

## 2024-02-09 NOTE — Consult Note (Signed)
 [facility]         Reason for Consult: thoracic discitis    Referring Physician: Mae Schlossman  Principal Problem:   Osteomyelitis of thoracic spine (HCC) Active Problems:   DM2 (diabetes mellitus, type 2) (HCC)   Essential hypertension   Acquired hypothyroidism   Acute low back pain   Fall   End stage renal disease on dialysis (HCC)   History of anemia due to chronic kidney disease   Acute midline thoracic back pain    HPI: Thomas [name] is a 73 y.o. male with history of MRSA, chronic vertebral osteomyelitis underwent HW thoracic fusion for stabilization, 2021 where he was treated with vancomycin , ceftriaxone  plus rifampin  for 8 wks through jan 2022, hx of peripheral neuropathy associated with T2DM, ESRD on HD. He was admitted on 4/21 for having ground level fall with injury to neck arms and back pain. He denies lower extremity wekaness. Due to back pain and hx of fusion, he underwent CT imaging that showed lucency about screws, paraspinal edema, and elevated inflammatory markers concerning for discitis/OM. Neurosurgery evaluated patient and didn't feel he was surgical candidate given his clinical findings. ID asked to weigh in on management.  Past Medical History:  Diagnosis Date   Arthritis    BPH (benign prostatic hyperplasia) 11/24/2019   Chronic pain syndrome 11/24/2019   Diabetes mellitus without complication (HCC)    ESRD (end stage renal disease) (HCC)    on HD   Gout    Hypertension    Muscle pain    Physical deconditioning 11/24/2019   Scars    Swelling     Allergies:  Allergies  Allergen Reactions   Other Other (See Comments)    Blood pressure issues Per [facility] hospital: Pt states he can only take these pain meds or else he gets very sick: Oxycontin ,morphine ,demerol, and dilaudid  are the only pain meds pt states he can take.     Oxymorphone Other (See Comments)    Causes kidney problems   Amitriptyline Other (See Comments)    Aspirin Nausea And Vomiting    Gi upset, has kidney issues   Beta Vulgaris Nausea And Vomiting   Buspirone Other (See Comments)    Affected his head    Cabbage Nausea And Vomiting   Codeine Other (See Comments)    Pt does not remember the reaction but knows he can not take    Cyclobenzaprine Nausea And Vomiting   Diflunisal Nausea And Vomiting and Other (See Comments)   Fish Allergy Nausea And Vomiting   Fish-Derived Products Nausea And Vomiting   Hydroxyzine Nausea And Vomiting   Levetiracetam Other (See Comments)   Methadone Other (See Comments)    Made him loopy   Nalbuphine Other (See Comments)    Cutaneous eruption (morphologic abnormality)   Pentazocine Other (See Comments)    Pt does not remember the reaction    Propoxyphene Other (See Comments)    Pt does not remember the reaction     Shellfish Allergy Nausea And Vomiting   Sulfa Antibiotics Hives   Sulfasalazine Other (See Comments)    Pt does not remember the reaction   Amoxicillin  Nausea And Vomiting and Rash    Per [facility]    Current antibiotics:   MEDICATIONS:  amLODipine   10 mg Oral Daily   atorvastatin   80 mg Oral Daily   bethanechol   10 mg Oral BID   Chlorhexidine  Gluconate Cloth  6 each Topical Q0600  cloNIDine   0.1 mg Oral TID   hydrALAZINE   25 mg Oral TID   insulin  aspart  0-6 Units Subcutaneous TID WC   leptospermum manuka honey  1 Application Topical Daily   levothyroxine   50 mcg Oral QAC breakfast   lidocaine   1 patch Transdermal Once   losartan   50 mg Oral Daily   metoprolol  tartrate  50 mg Oral BID   senna-docusate  4 tablet Oral BID   sevelamer  carbonate  1,600 mg Oral TID WC   tamsulosin   0.4 mg Oral Daily    Social History   Tobacco Use   Smoking status: Never    Passive exposure: Never   Smokeless tobacco: Never  Vaping Use   Vaping status: Never Used  Substance Use Topics   Alcohol use: No   Drug use: No    Family History  Problem Relation Age of Onset    Heart disease Mother    Cancer Father     Review of Systems -  +back pain and neck pain. 12 point ros is negative  OBJECTIVE: Temp:  [97.5 F (36.4 C)-99 F (37.2 C)] 98 F (36.7 C) (04/22 1300) Pulse Rate:  [51-77] 62 (04/22 1500) Resp:  [9-27] 15 (04/22 1500) BP: (136-188)/(74-124) 136/90 (04/22 1500) SpO2:  [93 %-100 %] 100 % (04/22 1500) Weight:  [92.2 kg] 92.2 kg (04/22 1300) Physical Exam  Constitutional: He is oriented to person, place, and time. He appears well-developed and well-nourished. No distress.  HENT:  Mouth/Throat: Oropharynx is clear and moist. No oropharyngeal exudate.  Cardiovascular: Normal rate, regular rhythm and normal heart sounds. Exam reveals no gallop and no friction rub.  No murmur heard.  Pulmonary/Chest: Effort normal and breath sounds normal. No respiratory distress. He has no wheezes.  Abdominal: Soft. Bowel sounds are normal. He exhibits no distension. There is no tenderness.  Ext: right bka Neurological: He is alert and oriented to person, place, and time.  Skin: Skin is warm and dry. No rash noted. No erythema.  Psychiatric: He has a normal mood and affect. His behavior is normal.    LABS: Results for orders placed or performed during the hospital encounter of 02/07/24 (from the past 48 hours)  Troponin I (High Sensitivity)     Status: None   Collection Time: 02/08/24  6:39 AM  Result Value Ref Range   Troponin I (High Sensitivity) 11 <18 ng/L    Comment: (NOTE) Elevated high sensitivity troponin I (hsTnI) values and significant  changes across serial measurements may suggest ACS but many other  chronic and acute conditions are known to elevate hsTnI results.  Refer to the "Links" section for chest pain algorithms and additional  guidance. Performed at [facility], [address] [place], [place] [address]   Comprehensive metabolic panel     Status: Abnormal   Collection Time: 02/08/24  6:39 AM  Result Value Ref Range    Sodium 139 135 - 145 mmol/L   Potassium 4.8 3.5 - 5.1 mmol/L   Chloride 99 98 - 111 mmol/L   CO2 24 22 - 32 mmol/L   Glucose, Bld 80 70 - 99 mg/dL    Comment: Glucose reference range applies only to samples taken after fasting for at least 8 hours.   BUN 41 (H) 8 - 23 mg/dL   Creatinine, Ser 8.41 (H) 0.61 - 1.24 mg/dL   Calcium  8.1 (L) 8.9 - 10.3 mg/dL   Total Protein 8.6 (H) 6.5 - 8.1 g/dL  Albumin  3.5 3.5 - 5.0 g/dL   AST 22 15 - 41 U/L   ALT 9 0 - 44 U/L   Alkaline Phosphatase 45 38 - 126 U/L   Total Bilirubin 0.7 0.0 - 1.2 mg/dL   GFR, Estimated 7 (L) >60 mL/min    Comment: (NOTE) Calculated using the CKD-EPI Creatinine Equation (2021)    Anion gap 16 (H) 5 - 15    Comment: Performed at [facility], [address], [place], [place] [address]  CBC     Status: Abnormal   Collection Time: 02/08/24  6:39 AM  Result Value Ref Range   WBC 5.3 4.0 - 10.5 K/uL   RBC 2.93 (L) 4.22 - 5.81 MIL/uL   Hemoglobin 10.0 (L) 13.0 - 17.0 g/dL   HCT 66.4 (L) 40.3 - 47.4 %   MCV 109.6 (H) 80.0 - 100.0 fL   MCH 34.1 (H) 26.0 - 34.0 pg   MCHC 31.2 30.0 - 36.0 g/dL   RDW 25.9 56.3 - 87.5 %   Platelets 151 150 - 400 K/uL   nRBC 0.0 0.0 - 0.2 %    Comment: Performed at [facility], [address] [place], [place] [address]  Ethanol     Status: None   Collection Time: 02/08/24  6:39 AM  Result Value Ref Range   Alcohol, Ethyl (B) <10 <10 mg/dL    Comment: (NOTE) For medical purposes only. Performed at [facility], [address] [place], [place] [address]   Protime-INR     Status: None   Collection Time: 02/08/24  6:39 AM  Result Value Ref Range   Prothrombin Time 14.3 11.4 - 15.2 seconds   INR 1.1 0.8 - 1.2    Comment: (NOTE) INR goal varies based on device and disease states. Performed at [facility], [address], [place], [place] [address]   I-Stat Chem 8, ED     Status: Abnormal   Collection Time: 02/08/24  6:43 AM  Result Value Ref Range    Sodium 138 135 - 145 mmol/L   Potassium 4.8 3.5 - 5.1 mmol/L   Chloride 103 98 - 111 mmol/L   BUN 39 (H) 8 - 23 mg/dL   Creatinine, Ser 6.30 (H) 0.61 - 1.24 mg/dL   Glucose, Bld 76 70 - 99 mg/dL    Comment: Glucose reference range applies only to samples taken after fasting for at least 8 hours.   Calcium , Ion 0.86 (LL) 1.15 - 1.40 mmol/L   TCO2 25 22 - 32 mmol/L   Hemoglobin 9.9 (L) 13.0 - 17.0 g/dL   HCT 16.0 (L) 10.9 - 32.3 %   Comment NOTIFIED PHYSICIAN   Comprehensive metabolic panel with GFR     Status: Abnormal   Collection Time: 02/09/24  4:06 AM  Result Value Ref Range   Sodium 138 135 - 145 mmol/L   Potassium 5.7 (H) 3.5 - 5.1 mmol/L   Chloride 101 98 - 111 mmol/L   CO2 21 (L) 22 - 32 mmol/L   Glucose, Bld 58 (L) 70 - 99 mg/dL    Comment: Glucose reference range applies only to samples taken after fasting for at least 8 hours.   BUN 47 (H) 8 - 23 mg/dL   Creatinine, Ser 5.57 (H) 0.61 - 1.24 mg/dL   Calcium  8.0 (L) 8.9 - 10.3 mg/dL   Total Protein 8.2 (H) 6.5 - 8.1 g/dL   Albumin  3.4 (L) 3.5 - 5.0 g/dL  AST 20 15 - 41 U/L   ALT 8 0 - 44 U/L   Alkaline Phosphatase 50 38 - 126 U/L   Total Bilirubin 0.9 0.0 - 1.2 mg/dL   GFR, Estimated 6 (L) >60 mL/min    Comment: (NOTE) Calculated using the CKD-EPI Creatinine Equation (2021)    Anion gap 16 (H) 5 - 15    Comment: Performed at [facility], [address], [place], [place] [address]  Magnesium      Status: None   Collection Time: 02/09/24  4:06 AM  Result Value Ref Range   Magnesium  2.4 1.7 - 2.4 mg/dL    Comment: Performed at [facility], [address] [place], [place] [address]  Phosphorus     Status: Abnormal   Collection Time: 02/09/24  4:06 AM  Result Value Ref Range   Phosphorus 8.2 (H) 2.5 - 4.6 mg/dL    Comment: Performed at [facility], [address] [place], [place] [address]  C-reactive protein     Status: Abnormal   Collection Time: 02/09/24  4:06 AM  Result Value Ref Range    CRP 1.4 (H) <1.0 mg/dL    Comment: Performed at [facility], [address], [place], [place] [address]  Vitamin B12     Status: Abnormal   Collection Time: 02/09/24  4:06 AM  Result Value Ref Range   Vitamin B-12 1,297 (H) 180 - 914 pg/mL    Comment: (NOTE) This assay is not validated for testing neonatal or myeloproliferative syndrome specimens for Vitamin B12 levels. Performed at [facility], [address] [place], [place] [address]   Folate     Status: None   Collection Time: 02/09/24  4:06 AM  Result Value Ref Range   Folate 15.9 >5.9 ng/mL    Comment: Performed at [facility], [address], [place], [place] [address]  Hemoglobin A1c     Status: Abnormal   Collection Time: 02/09/24  4:14 AM  Result Value Ref Range   Hgb A1c MFr Bld 4.2 (L) 4.8 - 5.6 %    Comment: (NOTE) Pre diabetes:          5.7%-6.4%  Diabetes:              >6.4%  Glycemic control for   <7.0% adults with diabetes    Mean Plasma Glucose 73.84 mg/dL    Comment: Performed at [facility], [address], [place], [place] [address]  CBC with Differential/Platelet     Status: Abnormal   Collection Time: 02/09/24  4:14 AM  Result Value Ref Range   WBC 5.6 4.0 - 10.5 K/uL   RBC 3.01 (L) 4.22 - 5.81 MIL/uL   Hemoglobin 10.3 (L) 13.0 - 17.0 g/dL   HCT 41.6 (L) 60.6 - 30.1 %   MCV 108.0 (H) 80.0 - 100.0 fL   MCH 34.2 (H) 26.0 - 34.0 pg   MCHC 31.7 30.0 - 36.0 g/dL   RDW 60.1 09.3 - 23.5 %   Platelets 169 150 - 400 K/uL   nRBC 0.0 0.0 - 0.2 %   Neutrophils Relative % 67 %   Neutro Abs 3.8 1.7 - 7.7 K/uL   Lymphocytes Relative 20 %   Lymphs Abs 1.1 0.7 - 4.0 K/uL   Monocytes Relative 9 %   Monocytes Absolute 0.5 0.1 - 1.0 K/uL   Eosinophils Relative 3 %   Eosinophils Absolute 0.2 0.0 - 0.5  K/uL   Basophils Relative 1 %   Basophils Absolute 0.0 0.0 - 0.1 K/uL   Immature Granulocytes 0 %   Abs Immature Granulocytes 0.01 0.00 - 0.07 K/uL    Comment: Performed at [facility], [address], [place], [place] [address]  Sedimentation rate     Status: Abnormal   Collection Time: 02/09/24  4:14 AM  Result Value Ref Range   Sed Rate 112 (H) 0 - 16 mm/hr    Comment: Performed at [facility], [address] [place], [place] [address]  CBG monitoring, ED     Status: Abnormal   Collection Time: 02/09/24  8:06 AM  Result Value Ref Range   Glucose-Capillary 102 (H) 70 - 99 mg/dL    Comment: Glucose reference range applies only to samples taken after fasting for at least 8 hours.  Glucose, capillary     Status: None   Collection Time: 02/09/24 12:12 PM  Result Value Ref Range   Glucose-Capillary 82 70 - 99 mg/dL    Comment: Glucose reference range applies only to samples taken after fasting for at least 8 hours.  Hepatitis B surface antigen     Status: None   Collection Time: 02/09/24 12:45 PM  Result Value Ref Range   Hepatitis B Surface Ag NON REACTIVE NON REACTIVE    Comment: Performed at [facility], [address] [place], [place] [address]    MICRO: blood cx ngtd  IMAGING: CT CHEST ABDOMEN PELVIS WO CONTRAST Addendum Date: 02/08/2024 ADDENDUM REPORT: 02/08/2024 14:27 ADDENDUM: After comparing to CT thoracic spine performed today, the abnormal changes in the lower thoracic spine include collapse of the T10 vertebral body, lucency around the T10 screws, T9-10 disc space and T10-11 disc space with abnormal surrounding paraspinal soft tissue swelling. Findings remain concerning for discitis/osteomyelitis. Electronically Signed   By: Janeece Mechanic M.D.   On: 02/08/2024 14:27   Result Date: 02/08/2024 CLINICAL DATA:  Polytrauma, blunt.  Fall. EXAM: CT CHEST, ABDOMEN AND PELVIS WITHOUT CONTRAST TECHNIQUE: Multidetector CT imaging of the chest, abdomen and pelvis was performed following the standard protocol without IV contrast. RADIATION DOSE REDUCTION: This exam was performed according to the departmental dose-optimization program which  includes automated exposure control, adjustment of the mA and/or kV according to patient size and/or use of iterative reconstruction technique. COMPARISON:  01/12/2024 FINDINGS: CT CHEST FINDINGS Cardiovascular: Scattered coronary artery and aortic calcifications. Heart is normal size. Aorta is normal caliber. Mediastinum/Nodes: No mediastinal, hilar, or axillary adenopathy. Trachea and esophagus are unremarkable. Thyroid unremarkable. Lungs/Pleura: Chronic changes in the lungs with areas of scarring bilaterally. No effusions or pneumothorax. Musculoskeletal: Postoperative changes with posterior spinal hardware at T10, T12 and extending through the lumbar spine. Stable lucency noted around the posterior spinal screws at T10 and at the T10-11 disc space. There is abnormal paraspinal soft tissue extending from T9-10 through T10-11. Appearance is concerning for possible with discitis/osteomyelitis. CT ABDOMEN PELVIS FINDINGS Hepatobiliary: No focal hepatic abnormality. Gallbladder unremarkable. Pancreas: No focal abnormality or ductal dilatation. Spleen: No focal abnormality.  Normal size. Adrenals/Urinary Tract: Adrenal glands normal. Severe chronic right hydronephrosis is stable when compared to prior study. Overlying cortical thinning. No hydronephrosis on the left. Urinary bladder unremarkable. Stomach/Bowel: Normal appendix. Stomach, large and small bowel grossly unremarkable. Vascular/Lymphatic: No evidence of aneurysm or adenopathy. Aortic atherosclerosis. Infrarenal IVC filter noted. Reproductive: No visible focal abnormality. Other: No free fluid or free air. Musculoskeletal: Postoperative changes in the lumbar  spine with severe degenerative changes throughout. Mild compression fracture at L1 is stable. Severe diffuse osteopenia. IMPRESSION: No acute traumatic injury in the chest, abdomen or pelvis. Abnormal appearance at T10 and T10-11 with bone destruction surrounding the posterior pedicle screws at T10 and  surrounding the T10-11 disc space. Adjacent abnormal paraspinal soft tissues. Appearance is concerning for possible discitis/osteomyelitis. Consider further evaluation with MRI. Chronic changes in the lungs with areas of scarring throughout. Coronary artery disease, aortic atherosclerosis. Severe chronic right hydronephrosis is stable since prior study. Electronically Signed: By: Janeece Mechanic M.D. On: 02/08/2024 14:10   CT Cervical Spine Wo Contrast Result Date: 02/08/2024 CLINICAL DATA:  Neck trauma (Age >= 65y).  Fall EXAM: CT CERVICAL SPINE WITHOUT CONTRAST TECHNIQUE: Multidetector CT imaging of the cervical spine was performed without intravenous contrast. Multiplanar CT image reconstructions were also generated. RADIATION DOSE REDUCTION: This exam was performed according to the departmental dose-optimization program which includes automated exposure control, adjustment of the mA and/or kV according to patient size and/or use of iterative reconstruction technique. COMPARISON:  02/05/2024 FINDINGS: Alignment: Degenerative anterolisthesis in the lower cervical spine, stable. Skull base and vertebrae: No acute fracture. Mild irregularity and collapse of the C5, C6 and C7 vertebral bodies is stable since prior study and likely related to advanced degenerative changes. Soft tissues and spinal canal: No prevertebral fluid or swelling. No visible canal hematoma. Disc levels: Advanced degenerative disc disease at C5-6 through C7-T1 with endplate irregularity and collapse noted at C5, C6 and C7 likely related to advanced degenerative changes. This is stable since prior study. Upper chest: No acute findings Other: None IMPRESSION: No acute bony abnormality. Advanced degenerative disc disease at C5-6 through C7-T1. Electronically Signed   By: Janeece Mechanic M.D.   On: 02/08/2024 14:24   CT Head Wo Contrast Result Date: 02/08/2024 CLINICAL DATA:  Head trauma, minor (Age >= 65y).  Fall. EXAM: CT HEAD WITHOUT CONTRAST  TECHNIQUE: Contiguous axial images were obtained from the base of the skull through the vertex without intravenous contrast. RADIATION DOSE REDUCTION: This exam was performed according to the departmental dose-optimization program which includes automated exposure control, adjustment of the mA and/or kV according to patient size and/or use of iterative reconstruction technique. COMPARISON:  12/22/2023 FINDINGS: Brain: Diffuse cerebral atrophy. No acute intracranial abnormality. Specifically, no hemorrhage, hydrocephalus, mass lesion, acute infarction, or significant intracranial injury. Vascular: No hyperdense vessel or unexpected calcification. Skull: No acute calvarial abnormality. Sinuses/Orbits: No acute findings Other: None IMPRESSION: Atrophy.  No acute intracranial abnormality. Electronically Signed   By: Janeece Mechanic M.D.   On: 02/08/2024 14:21   CT T-SPINE NO CHARGE Result Date: 02/08/2024 CLINICAL DATA:  Fall, trauma EXAM: CT THORACIC SPINE WITHOUT CONTRAST TECHNIQUE: Multidetector CT images of the thoracic were obtained using the standard protocol without intravenous contrast. RADIATION DOSE REDUCTION: This exam was performed according to the departmental dose-optimization program which includes automated exposure control, adjustment of the mA and/or kV according to patient size and/or use of iterative reconstruction technique. COMPARISON:  Thoracic MRI 09/17/2021 FINDINGS: Alignment: Normal Vertebrae: Postoperative changes from posterior fusion from T10-T12. There is collapse of the T10 vertebral body with abnormal lucency around the T10 screws, the T9-10 disc base and T10-11 disc space. Surrounding paraspinal soft tissue swelling. Appearance is concerning for discitis/osteomyelitis. Paraspinal and other soft tissues: Abnormal paraspinal soft tissue from T9-T11. Disc levels: Advanced degenerative changes in the lower thoracic spine and lower cervical spine. IMPRESSION: No acute traumatic findings.  Prior posterior fusion from  T10-T12. Abnormal appearance with collapse of the T10 vertebral body, lucency around the T10 screws, at the T9-10 disc space and T10-11 disc space concerning for discitis/osteomyelitis. Abnormal surrounding paraspinal soft tissue swelling. Electronically Signed   By: Janeece Mechanic M.D.   On: 02/08/2024 14:19   CT L-SPINE NO CHARGE Result Date: 02/08/2024 CLINICAL DATA:  Fall, trauma EXAM: CT LUMBAR SPINE WITHOUT CONTRAST TECHNIQUE: Multidetector CT imaging of the lumbar spine was performed without intravenous contrast administration. Multiplanar CT image reconstructions were also generated. RADIATION DOSE REDUCTION: This exam was performed according to the departmental dose-optimization program which includes automated exposure control, adjustment of the mA and/or kV according to patient size and/or use of iterative reconstruction technique. COMPARISON:  CT abdomen and pelvis 01/01/2024. FINDINGS: Segmentation: 5 lumbar type vertebrae. Alignment: No subluxation Vertebrae: Severe diffuse osteopenia. Mild chronic compression fracture at L1 is stable since prior study. Postoperative changes from posterior fusion. Advanced degenerative disc and facet disease. Fusion of the facets diffusely. Paraspinal and other soft tissues: No paraspinal soft tissue abnormality in the lumbar spine. Disc levels: Severe diffuse degenerative disc disease and facet disease. Fusion across the facet joints diffusely. IMPRESSION: No acute bony abnormality. Mild chronic compression fracture at L1. Severe osteopenia. Posterior fusion changes throughout the lumbar spine and extending into the lower thoracic spine. Severe diffuse degenerative disc and facet disease with fusion across the facet joints. Electronically Signed   By: Janeece Mechanic M.D.   On: 02/08/2024 14:16   DG Wrist Complete Left Result Date: 02/08/2024 CLINICAL DATA:  Fall. EXAM: LEFT WRIST - COMPLETE 3+ VIEW COMPARISON:  None Available. FINDINGS:  There is 2 mm ulnar negative variance. Severe radiocarpal joint space narrowing with radius-scaphoid bone-on-bone contact. Mild 1 mm chronic impression/erosion by the scaphoid on the distal radius. 10 mm lucent cyst within the distal lateral aspect of the radius and 11 mm lucent cyst within the distal ulna, possibly degenerative. Severe triscaphe joint space narrowing, subchondral sclerosis, and peripheral osteophytosis. Moderate to severe thumb carpometacarpal joint space narrowing and peripheral osteophytosis. No acute fracture is seen.  No dislocation. IMPRESSION: 1. No acute fracture is seen. 2. Severe radiocarpal and triscaphe osteoarthritis. 3. Moderate to severe thumb carpometacarpal osteoarthritis. Electronically Signed   By: Bertina Broccoli M.D.   On: 02/08/2024 08:42   DG Elbow Complete Right Result Date: 02/08/2024 CLINICAL DATA:  Right elbow pain.  Fall. EXAM: RIGHT ELBOW - COMPLETE 3+ VIEW COMPARISON:  None Available. FINDINGS: There is diffuse decreased bone mineralization. Normal position of the distal anterior humeral fat pad without evidence of elbow joint effusion. Mild peripheral medial coronoid process degenerative spurring at the medial aspect of the elbow. Mild degenerative spurring at the volar tip of the coronoid process and the posterior volar tip of the olecranon. There is mild to moderate thickening of the olecranon bursa, measuring up to approximately 12 mm in AP dimension. There are numerous small calcific density seen throughout the olecranon bursa measuring up to approximately 3.9 cm in craniocaudal dimension and 1.2 cm in AP dimension. IMPRESSION: 1. Mild to moderate thickening of the olecranon bursa with numerous small calcific densities throughout the olecranon bursa. 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 2. No acute fracture is seen. Electronically Signed   By: Bertina Broccoli M.D.   On: 02/08/2024 08:40   DG Shoulder Left Result Date: 02/08/2024 CLINICAL  DATA:  Fall. EXAM: LEFT SHOULDER - 2+ VIEW COMPARISON:  None Available. FINDINGS: There is diffuse decreased bone mineralization. Mild  inferior glenohumeral joint space narrowing and inferior glenoid and humeral head-neck junction degenerative spurring. Mild degenerative spurring at the superior aspect of the acromioclavicular joint. There are multiple linear calcific densities overlying the bilateral, mid to inferior and mid to posterior aspect of the shoulder, possibly within the region of the deltoid musculature, nonspecific and chronic. No acute fracture is seen. No dislocation. IMPRESSION: 1. Mild glenohumeral and acromioclavicular osteoarthritis. 2. No acute fracture is seen. Electronically Signed   By: Bertina Broccoli M.D.   On: 02/08/2024 08:37    HISTORICAL MICRO/IMAGING  Assessment/Plan:  73yo M with thoracic discitis --from posterior fusion from T10-T12. There is collapse of the T10 vertebral body with abnormal lucency around the T10 screws, the T9-10 disc base and T10-11 disc space. Surrounding paraspinal soft tissue swelling. Appearance is concerning for discitis/osteomyelitis. Paraspinal and other soft tissues: Abnormal paraspinal soft tissue from T9-T11  - initially will see if IR can aspirate -- for culture.. We spoke to IR team --- which reviewed imaging ---and didn't feel they could aspirate.  - we will plan to treat for thoracic discitis with empiric regimen of vancomycin  plus cefepime  which can be given with HD. - plan for 6 wk

## 2024-02-09 NOTE — Progress Notes (Signed)
 Patient admitted to [facility]W23 from ER. Orientated to room and equipment. Vital signs done and charted. CHG bath done. Patient resting comfortable in bed at this time.

## 2024-02-09 NOTE — Consult Note (Signed)
 Providing Compassionate, Quality Care - Together   Reason for Consult: Osteomyelitis/Discitis Referring Physician: Dr. Ellen [name] is an 73 y.o. male.  HPI: Thomas [name] is a 73 year old male with extensive past medical history. He underwent a posterior fusion at T10-12 on 09/04/2020 by Dr. Julane Ny due to osteodiscitis/epidural abscess. Patient presented to the [facility] Emergency Department last night due to a fall where he attempted to catch himself with bilateral upper extremities. He denies loss of consciousness. He reports right-sided neck and arm pain and left low back pain. He denies weakness in his lower extremities. He does have chronic numbness in his left foot due to diabetic neuropathy. He tells me he has also been recently treated with antibiotics for a wound with cellulitis in his left foot. He is not the best historian and tells me he has never had a spinal infection requiring prolonged antibiotic therapy. He tells me he is unable to have an MRI as it will "kill" him. I have not found any documentation to support contraindications to this patient getting an MRI.  Past Medical History:  Diagnosis Date   Arthritis    BPH (benign prostatic hyperplasia) 11/24/2019   Chronic pain syndrome 11/24/2019   Diabetes mellitus without complication (HCC)    ESRD (end stage renal disease) (HCC)    on HD   Gout    Hypertension    Muscle pain    Physical deconditioning 11/24/2019   Scars    Swelling     Past Surgical History:  Procedure Laterality Date   BELOW KNEE LEG AMPUTATION Right    ESOPHAGOGASTRODUODENOSCOPY (EGD) WITH PROPOFOL  N/A 11/25/2019   Procedure: ESOPHAGOGASTRODUODENOSCOPY (EGD) WITH PROPOFOL ;  Surgeon: Alvis Jourdain, MD;  Location: [facility] ENDOSCOPY;  Service: Endoscopy;  Laterality: N/A;   IR FLUORO GUIDE CV LINE RIGHT  09/05/2019   IR LUMBAR DISC ASPIRATION W/IMG GUIDE  11/26/2019   IR US  GUIDE VASC ACCESS RIGHT  09/05/2019   RADIOLOGY WITH ANESTHESIA N/A  11/25/2019   Procedure: Thoracic and Lumbar MRI WITH ANESTHESIA;  Surgeon: Radiologist, Medication, MD;  Location: [facility] OR;  Service: Radiology;  Laterality: N/A;    Family History  Problem Relation Age of Onset   Heart disease Mother    Cancer Father     Social History:  reports that he has never smoked. He has never been exposed to tobacco smoke. He has never used smokeless tobacco. He reports that he does not drink alcohol and does not use drugs.  Allergies:  Allergies  Allergen Reactions   Other Other (See Comments)    Blood pressure issues Per [facility] hospital: Pt states he can only take these pain meds or else he gets very sick: Oxycontin ,morphine ,demerol, and dilaudid  are the only pain meds pt states he can take.     Oxymorphone Other (See Comments)    Causes kidney problems   Amitriptyline Other (See Comments)   Aspirin Nausea And Vomiting    Gi upset, has kidney issues   Beta Vulgaris Nausea And Vomiting   Buspirone Other (See Comments)    Affected his head    Cabbage Nausea And Vomiting   Codeine Other (See Comments)    Pt does not remember the reaction but knows he can not take    Cyclobenzaprine Nausea And Vomiting   Diflunisal Nausea And Vomiting and Other (See Comments)   Fish Allergy Nausea And Vomiting   Fish-Derived Products Nausea And Vomiting   Hydroxyzine Nausea And Vomiting  Levetiracetam Other (See Comments)   Methadone Other (See Comments)    Made him loopy   Nalbuphine Other (See Comments)    Cutaneous eruption (morphologic abnormality)   Pentazocine Other (See Comments)    Pt does not remember the reaction    Propoxyphene Other (See Comments)    Pt does not remember the reaction     Shellfish Allergy Nausea And Vomiting   Sulfa Antibiotics Hives   Sulfasalazine Other (See Comments)    Pt does not remember the reaction   Amoxicillin  Nausea And Vomiting and Rash    Per [facility]    Medications: I have reviewed the patient's current  medications.  Results for orders placed or performed during the hospital encounter of 02/07/24 (from the past 48 hours)  Troponin I (High Sensitivity)     Status: None   Collection Time: 02/08/24  6:39 AM  Result Value Ref Range   Troponin I (High Sensitivity) 11 <18 ng/L    Comment: (NOTE) Elevated high sensitivity troponin I (hsTnI) values and significant  changes across serial measurements may suggest ACS but many other  chronic and acute conditions are known to elevate hsTnI results.  Refer to the "Links" section for chest pain algorithms and additional  guidance. Performed at [name], [address] [place], [place] [address]   Comprehensive metabolic panel     Status: Abnormal   Collection Time: 02/08/24  6:39 AM  Result Value Ref Range   Sodium 139 135 - 145 mmol/L   Potassium 4.8 3.5 - 5.1 mmol/L   Chloride 99 98 - 111 mmol/L   CO2 24 22 - 32 mmol/L   Glucose, Bld 80 70 - 99 mg/dL    Comment: Glucose reference range applies only to samples taken after fasting for at least 8 hours.   BUN 41 (H) 8 - 23 mg/dL   Creatinine, Ser 1.19 (H) 0.61 - 1.24 mg/dL   Calcium  8.1 (L) 8.9 - 10.3 mg/dL   Total Protein 8.6 (H) 6.5 - 8.1 g/dL   Albumin  3.5 3.5 - 5.0 g/dL   AST 22 15 - 41 U/L   ALT 9 0 - 44 U/L   Alkaline Phosphatase 45 38 - 126 U/L   Total Bilirubin 0.7 0.0 - 1.2 mg/dL   GFR, Estimated 7 (L) >60 mL/min    Comment: (NOTE) Calculated using the CKD-EPI Creatinine Equation (2021)    Anion gap 16 (H) 5 - 15    Comment: Performed at [facility], [address] [place], [place] [address]  CBC     Status: Abnormal   Collection Time: 02/08/24  6:39 AM  Result Value Ref Range   WBC 5.3 4.0 - 10.5 K/uL   RBC 2.93 (L) 4.22 - 5.81 MIL/uL   Hemoglobin 10.0 (L) 13.0 - 17.0 g/dL   HCT 95.6 (L) 21.3 - 08.6 %   MCV 109.6 (H) 80.0 - 100.0 fL   MCH 34.1 (H) 26.0 - 34.0 pg   MCHC 31.2 30.0 - 36.0 g/dL   RDW 57.8 46.9 - 62.9 %   Platelets 151 150 - 400 K/uL    nRBC 0.0 0.0 - 0.2 %    Comment: Performed at [facility], [address], [place], [place] [address]  Ethanol     Status: None   Collection Time: 02/08/24  6:39 AM  Result Value Ref Range   Alcohol, Ethyl (B) <10 <10 mg/dL    Comment: (NOTE) For medical  purposes only. Performed at [facility], [address] [place], [place] [address]   Protime-INR     Status: None   Collection Time: 02/08/24  6:39 AM  Result Value Ref Range   Prothrombin Time 14.3 11.4 - 15.2 seconds   INR 1.1 0.8 - 1.2    Comment: (NOTE) INR goal varies based on device and disease states. Performed at [facility], [address] [place], [place] [address]   I-Stat Chem 8, ED     Status: Abnormal   Collection Time: 02/08/24  6:43 AM  Result Value Ref Range   Sodium 138 135 - 145 mmol/L   Potassium 4.8 3.5 - 5.1 mmol/L   Chloride 103 98 - 111 mmol/L   BUN 39 (H) 8 - 23 mg/dL   Creatinine, Ser 5.63 (H) 0.61 - 1.24 mg/dL   Glucose, Bld 76 70 - 99 mg/dL    Comment: Glucose reference range applies only to samples taken after fasting for at least 8 hours.   Calcium , Ion 0.86 (LL) 1.15 - 1.40 mmol/L   TCO2 25 22 - 32 mmol/L   Hemoglobin 9.9 (L) 13.0 - 17.0 g/dL   HCT 87.5 (L) 64.3 - 32.9 %   Comment NOTIFIED PHYSICIAN   Comprehensive metabolic panel with GFR     Status: Abnormal   Collection Time: 02/09/24  4:06 AM  Result Value Ref Range   Sodium 138 135 - 145 mmol/L   Potassium 5.7 (H) 3.5 - 5.1 mmol/L   Chloride 101 98 - 111 mmol/L   CO2 21 (L) 22 - 32 mmol/L   Glucose, Bld 58 (L) 70 - 99 mg/dL    Comment: Glucose reference range applies only to samples taken after fasting for at least 8 hours.   BUN 47 (H) 8 - 23 mg/dL   Creatinine, Ser 5.18 (H) 0.61 - 1.24 mg/dL   Calcium  8.0 (L) 8.9 - 10.3 mg/dL   Total Protein 8.2 (H) 6.5 - 8.1 g/dL   Albumin  3.4 (L) 3.5 - 5.0 g/dL   AST 20 15 - 41 U/L   ALT 8 0 - 44 U/L   Alkaline Phosphatase 50 38 - 126 U/L   Total Bilirubin 0.9 0.0 -  1.2 mg/dL   GFR, Estimated 6 (L) >60 mL/min    Comment: (NOTE) Calculated using the CKD-EPI Creatinine Equation (2021)    Anion gap 16 (H) 5 - 15    Comment: Performed at [facility], [address], [place], [place] [address]  Magnesium      Status: None   Collection Time: 02/09/24  4:06 AM  Result Value Ref Range   Magnesium  2.4 1.7 - 2.4 mg/dL    Comment: Performed at [facility], [address] [place], [place] [address]  Phosphorus     Status: Abnormal   Collection Time: 02/09/24  4:06 AM  Result Value Ref Range   Phosphorus 8.2 (H) 2.5 - 4.6 mg/dL    Comment: Performed at [facility], [address] [place], [place] [address]  C-reactive protein     Status: Abnormal   Collection Time: 02/09/24  4:06 AM  Result Value Ref Range   CRP 1.4 (H) <1.0 mg/dL    Comment: Performed at [facility], [address] [place], [place] [address]  Vitamin B12     Status: Abnormal   Collection Time: 02/09/24  4:06 AM  Result Value Ref Range   Vitamin B-12  1,297 (H) 180 - 914 pg/mL    Comment: (NOTE) This assay is not validated for testing neonatal or myeloproliferative syndrome specimens for Vitamin B12 levels. Performed at [facility], [address] [place], [place] [address]   Folate     Status: None   Collection Time: 02/09/24  4:06 AM  Result Value Ref Range   Folate 15.9 >5.9 ng/mL    Comment: Performed at [facility], [address] [place], [place] [address]  Hemoglobin A1c     Status: Abnormal   Collection Time: 02/09/24  4:14 AM  Result Value Ref Range   Hgb A1c MFr Bld 4.2 (L) 4.8 - 5.6 %    Comment: (NOTE) Pre diabetes:          5.7%-6.4%  Diabetes:              >6.4%  Glycemic control for   <7.0% adults with diabetes    Mean Plasma Glucose 73.84 mg/dL    Comment: Performed at [facility], [address], [place], [place] [address]  CBC with Differential/Platelet     Status: Abnormal   Collection Time: 02/09/24   4:14 AM  Result Value Ref Range   WBC 5.6 4.0 - 10.5 K/uL   RBC 3.01 (L) 4.22 - 5.81 MIL/uL   Hemoglobin 10.3 (L) 13.0 - 17.0 g/dL   HCT 91.4 (L) 78.2 - 95.6 %   MCV 108.0 (H) 80.0 - 100.0 fL   MCH 34.2 (H) 26.0 - 34.0 pg   MCHC 31.7 30.0 - 36.0 g/dL   RDW 21.3 08.6 - 57.8 %   Platelets 169 150 - 400 K/uL   nRBC 0.0 0.0 - 0.2 %   Neutrophils Relative % 67 %   Neutro Abs 3.8 1.7 - 7.7 K/uL   Lymphocytes Relative 20 %   Lymphs Abs 1.1 0.7 - 4.0 K/uL   Monocytes Relative 9 %   Monocytes Absolute 0.5 0.1 - 1.0 K/uL   Eosinophils Relative 3 %   Eosinophils Absolute 0.2 0.0 - 0.5 K/uL   Basophils Relative 1 %   Basophils Absolute 0.0 0.0 - 0.1 K/uL   Immature Granulocytes 0 %   Abs Immature Granulocytes 0.01 0.00 - 0.07 K/uL    Comment: Performed at [facility], [address] [place], [place] [address]  Sedimentation rate     Status: Abnormal   Collection Time: 02/09/24  4:14 AM  Result Value Ref Range   Sed Rate 112 (H) 0 - 16 mm/hr    Comment: Performed at [facility], [address], [place], [place] [address]  CBG monitoring, ED     Status: Abnormal   Collection Time: 02/09/24  8:06 AM  Result Value Ref Range   Glucose-Capillary 102 (H) 70 - 99 mg/dL    Comment: Glucose reference range applies only to samples taken after fasting for at least 8 hours.    CT CHEST ABDOMEN PELVIS WO CONTRAST Addendum Date: 02/08/2024 ADDENDUM REPORT: 02/08/2024 14:27 ADDENDUM: After comparing to CT thoracic spine performed today, the abnormal changes in the lower thoracic spine include collapse of the T10 vertebral body, lucency around the T10 screws, T9-10 disc space and T10-11 disc space with abnormal surrounding paraspinal soft tissue swelling. Findings remain concerning for discitis/osteomyelitis. Electronically Signed   By: Janeece Mechanic M.D.   On: 02/08/2024 14:27   Result Date: 02/08/2024 CLINICAL DATA:  Polytrauma, blunt.  Fall. EXAM: CT CHEST, ABDOMEN AND  PELVIS WITHOUT  CONTRAST TECHNIQUE: Multidetector CT imaging of the chest, abdomen and pelvis was performed following the standard protocol without IV contrast. RADIATION DOSE REDUCTION: This exam was performed according to the departmental dose-optimization program which includes automated exposure control, adjustment of the mA and/or kV according to patient size and/or use of iterative reconstruction technique. COMPARISON:  01/12/2024 FINDINGS: CT CHEST FINDINGS Cardiovascular: Scattered coronary artery and aortic calcifications. Heart is normal size. Aorta is normal caliber. Mediastinum/Nodes: No mediastinal, hilar, or axillary adenopathy. Trachea and esophagus are unremarkable. Thyroid unremarkable. Lungs/Pleura: Chronic changes in the lungs with areas of scarring bilaterally. No effusions or pneumothorax. Musculoskeletal: Postoperative changes with posterior spinal hardware at T10, T12 and extending through the lumbar spine. Stable lucency noted around the posterior spinal screws at T10 and at the T10-11 disc space. There is abnormal paraspinal soft tissue extending from T9-10 through T10-11. Appearance is concerning for possible with discitis/osteomyelitis. CT ABDOMEN PELVIS FINDINGS Hepatobiliary: No focal hepatic abnormality. Gallbladder unremarkable. Pancreas: No focal abnormality or ductal dilatation. Spleen: No focal abnormality.  Normal size. Adrenals/Urinary Tract: Adrenal glands normal. Severe chronic right hydronephrosis is stable when compared to prior study. Overlying cortical thinning. No hydronephrosis on the left. Urinary bladder unremarkable. Stomach/Bowel: Normal appendix. Stomach, large and small bowel grossly unremarkable. Vascular/Lymphatic: No evidence of aneurysm or adenopathy. Aortic atherosclerosis. Infrarenal IVC filter noted. Reproductive: No visible focal abnormality. Other: No free fluid or free air. Musculoskeletal: Postoperative changes in the lumbar spine with severe degenerative changes  throughout. Mild compression fracture at L1 is stable. Severe diffuse osteopenia. IMPRESSION: No acute traumatic injury in the chest, abdomen or pelvis. Abnormal appearance at T10 and T10-11 with bone destruction surrounding the posterior pedicle screws at T10 and surrounding the T10-11 disc space. Adjacent abnormal paraspinal soft tissues. Appearance is concerning for possible discitis/osteomyelitis. Consider further evaluation with MRI. Chronic changes in the lungs with areas of scarring throughout. Coronary artery disease, aortic atherosclerosis. Severe chronic right hydronephrosis is stable since prior study. Electronically Signed: By: Janeece Mechanic M.D. On: 02/08/2024 14:10   CT Cervical Spine Wo Contrast Result Date: 02/08/2024 CLINICAL DATA:  Neck trauma (Age >= 65y).  Fall EXAM: CT CERVICAL SPINE WITHOUT CONTRAST TECHNIQUE: Multidetector CT imaging of the cervical spine was performed without intravenous contrast. Multiplanar CT image reconstructions were also generated. RADIATION DOSE REDUCTION: This exam was performed according to the departmental dose-optimization program which includes automated exposure control, adjustment of the mA and/or kV according to patient size and/or use of iterative reconstruction technique. COMPARISON:  02/05/2024 FINDINGS: Alignment: Degenerative anterolisthesis in the lower cervical spine, stable. Skull base and vertebrae: No acute fracture. Mild irregularity and collapse of the C5, C6 and C7 vertebral bodies is stable since prior study and likely related to advanced degenerative changes. Soft tissues and spinal canal: No prevertebral fluid or swelling. No visible canal hematoma. Disc levels: Advanced degenerative disc disease at C5-6 through C7-T1 with endplate irregularity and collapse noted at C5, C6 and C7 likely related to advanced degenerative changes. This is stable since prior study. Upper chest: No acute findings Other: None IMPRESSION: No acute bony abnormality.  Advanced degenerative disc disease at C5-6 through C7-T1. Electronically Signed   By: Janeece Mechanic M.D.   On: 02/08/2024 14:24   CT Head Wo Contrast Result Date: 02/08/2024 CLINICAL DATA:  Head trauma, minor (Age >= 65y).  Fall. EXAM: CT HEAD WITHOUT CONTRAST TECHNIQUE: Contiguous axial images were obtained from the base of the skull through the vertex without intravenous contrast.  RADIATION DOSE REDUCTION: This exam was performed according to the departmental dose-optimization program which includes automated exposure control, adjustment of the mA and/or kV according to patient size and/or use of iterative reconstruction technique. COMPARISON:  12/22/2023 FINDINGS: Brain: Diffuse cerebral atrophy. No acute intracranial abnormality. Specifically, no hemorrhage, hydrocephalus, mass lesion, acute infarction, or significant intracranial injury. Vascular: No hyperdense vessel or unexpected calcification. Skull: No acute calvarial abnormality. Sinuses/Orbits: No acute findings Other: None IMPRESSION: Atrophy.  No acute intracranial abnormality. Electronically Signed   By: Janeece Mechanic M.D.   On: 02/08/2024 14:21   CT T-SPINE NO CHARGE Result Date: 02/08/2024 CLINICAL DATA:  Fall, trauma EXAM: CT THORACIC SPINE WITHOUT CONTRAST TECHNIQUE: Multidetector CT images of the thoracic were obtained using the standard protocol without intravenous contrast. RADIATION DOSE REDUCTION: This exam was performed according to the departmental dose-optimization program which includes automated exposure control, adjustment of the mA and/or kV according to patient size and/or use of iterative reconstruction technique. COMPARISON:  Thoracic MRI 09/17/2021 FINDINGS: Alignment: Normal Vertebrae: Postoperative changes from posterior fusion from T10-T12. There is collapse of the T10 vertebral body with abnormal lucency around the T10 screws, the T9-10 disc base and T10-11 disc space. Surrounding paraspinal soft tissue swelling. Appearance  is concerning for discitis/osteomyelitis. Paraspinal and other soft tissues: Abnormal paraspinal soft tissue from T9-T11. Disc levels: Advanced degenerative changes in the lower thoracic spine and lower cervical spine. IMPRESSION: No acute traumatic findings. Prior posterior fusion from T10-T12. Abnormal appearance with collapse of the T10 vertebral body, lucency around the T10 screws, at the T9-10 disc space and T10-11 disc space concerning for discitis/osteomyelitis. Abnormal surrounding paraspinal soft tissue swelling. Electronically Signed   By: Janeece Mechanic M.D.   On: 02/08/2024 14:19   CT L-SPINE NO CHARGE Result Date: 02/08/2024 CLINICAL DATA:  Fall, trauma EXAM: CT LUMBAR SPINE WITHOUT CONTRAST TECHNIQUE: Multidetector CT imaging of the lumbar spine was performed without intravenous contrast administration. Multiplanar CT image reconstructions were also generated. RADIATION DOSE REDUCTION: This exam was performed according to the departmental dose-optimization program which includes automated exposure control, adjustment of the mA and/or kV according to patient size and/or use of iterative reconstruction technique. COMPARISON:  CT abdomen and pelvis 01/01/2024. FINDINGS: Segmentation: 5 lumbar type vertebrae. Alignment: No subluxation Vertebrae: Severe diffuse osteopenia. Mild chronic compression fracture at L1 is stable since prior study. Postoperative changes from posterior fusion. Advanced degenerative disc and facet disease. Fusion of the facets diffusely. Paraspinal and other soft tissues: No paraspinal soft tissue abnormality in the lumbar spine. Disc levels: Severe diffuse degenerative disc disease and facet disease. Fusion across the facet joints diffusely. IMPRESSION: No acute bony abnormality. Mild chronic compression fracture at L1. Severe osteopenia. Posterior fusion changes throughout the lumbar spine and extending into the lower thoracic spine. Severe diffuse degenerative disc and facet  disease with fusion across the facet joints. Electronically Signed   By: Janeece Mechanic M.D.   On: 02/08/2024 14:16   DG Wrist Complete Left Result Date: 02/08/2024 CLINICAL DATA:  Fall. EXAM: LEFT WRIST - COMPLETE 3+ VIEW COMPARISON:  None Available. FINDINGS: There is 2 mm ulnar negative variance. Severe radiocarpal joint space narrowing with radius-scaphoid bone-on-bone contact. Mild 1 mm chronic impression/erosion by the scaphoid on the distal radius. 10 mm lucent cyst within the distal lateral aspect of the radius and 11 mm lucent cyst within the distal ulna, possibly degenerative. Severe triscaphe joint space narrowing, subchondral sclerosis, and peripheral osteophytosis. Moderate to severe thumb carpometacarpal joint space narrowing and  peripheral osteophytosis. No acute fracture is seen.  No dislocation. IMPRESSION: 1. No acute fracture is seen. 2. Severe radiocarpal and triscaphe osteoarthritis. 3. Moderate to severe thumb carpometacarpal osteoarthritis. Electronically Signed   By: Bertina Broccoli M.D.   On: 02/08/2024 08:42   DG Elbow Complete Right Result Date: 02/08/2024 CLINICAL DATA:  Right elbow pain.  Fall. EXAM: RIGHT ELBOW - COMPLETE 3+ VIEW COMPARISON:  None Available. FINDINGS: There is diffuse decreased bone mineralization. Normal position of the distal anterior humeral fat pad without evidence of elbow joint effusion. Mild peripheral medial coronoid process degenerative spurring at the medial aspect of the elbow. Mild degenerative spurring at the volar tip of the coronoid process and the posterior volar tip of the olecranon. There is mild to moderate thickening of the olecranon bursa, measuring up to approximately 12 mm in AP dimension. There are numerous small calcific density seen throughout the olecranon bursa measuring up to approximately 3.9 cm in craniocaudal dimension and 1.2 cm in AP dimension. IMPRESSION: 1. Mild to moderate thickening of the olecranon bursa with numerous small  calcific densities throughout the olecranon bursa. 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 2. No acute fracture is seen. Electronically Signed   By: Bertina Broccoli M.D.   On: 02/08/2024 08:40   DG Shoulder Left Result Date: 02/08/2024 CLINICAL DATA:  Fall. EXAM: LEFT SHOULDER - 2+ VIEW COMPARISON:  None Available. FINDINGS: There is diffuse decreased bone mineralization. Mild inferior glenohumeral joint space narrowing and inferior glenoid and humeral head-neck junction degenerative spurring. Mild degenerative spurring at the superior aspect of the acromioclavicular joint. There are multiple linear calcific densities overlying the bilateral, mid to inferior and mid to posterior aspect of the shoulder, possibly within the region of the deltoid musculature, nonspecific and chronic. No acute fracture is seen. No dislocation. IMPRESSION: 1. Mild glenohumeral and acromioclavicular osteoarthritis. 2. No acute fracture is seen. Electronically Signed   By: Bertina Broccoli M.D.   On: 02/08/2024 08:37    Review of Systems  Constitutional: Negative.   HENT: Negative.    Eyes: Negative.   Respiratory: Negative.    Cardiovascular: Negative.   Gastrointestinal: Negative.   Musculoskeletal:  Positive for back pain, falls and neck pain.       Right arm and neck pain Left lower back pain  Skin:        Foot wound  Neurological:  Positive for tingling and weakness.  Endo/Heme/Allergies: Negative.   Psychiatric/Behavioral: Negative.     Blood pressure (!) 167/98, pulse 77, temperature 99 F (37.2 C), temperature source Oral, resp. rate 14, height 6\' 5"  (1.956 m), weight 94.8 kg, SpO2 100%. Estimated body mass index is 24.78 kg/m as calculated from the following:   Height as of this encounter: 6\' 5"  (1.956 m).   Weight as of this encounter: 94.8 kg.  Physical Exam HENT:     Head: Normocephalic and atraumatic.     Nose: Nose normal.     Mouth/Throat:     Mouth: Mucous membranes are  moist.     Pharynx: Oropharynx is clear.  Eyes:     Extraocular Movements: Extraocular movements intact.     Pupils: Pupils are equal, round, and reactive to light.  Cardiovascular:     Rate and Rhythm: Normal rate and regular rhythm.     Pulses: Normal pulses.  Pulmonary:     Effort: Pulmonary effort is normal.  Abdominal:     Palpations: Abdomen is soft.  Musculoskeletal:  General: Normal range of motion.     Cervical back: Tenderness present.     Right Lower Extremity: Right leg is amputated below knee.  Skin:    General: Skin is warm and dry.     Capillary Refill: Capillary refill takes less than 2 seconds.       Neurological:     Mental Status: He is alert and oriented to person, place, and time.  Psychiatric:        Attention and Perception: He is inattentive.        Mood and Affect: Mood normal.        Behavior: Behavior is cooperative.        Cognition and Memory: Cognition is impaired. Memory is impaired. He exhibits impaired recent memory.     Assessment/Plan: Patient with recurrent osteomyelitis/discitis. MRI of the cervical and thoracic spine would be helpful in developing a treatment plan for this patient if patient is able. Would recommend IR aspiration at the thoracic spine for antibiotic therapy. Recommend ID consult for antibiotic therapy and WOC nurse consult for left foot. No surgical intervention recommended presently. Neurosurgery will continue to follow.  I am in communication with my attending and they agree with the plan for this patient.   Thomas Locus, DNP, AGNP-C Nurse Practitioner  [facility] [address], [place], [place] [address] P: [phone]    F: [phone]  02/09/2024, 10:51 AM

## 2024-02-10 ENCOUNTER — Other Ambulatory Visit (HOSPITAL_COMMUNITY)

## 2024-02-10 DIAGNOSIS — E039 Hypothyroidism, unspecified: Secondary | ICD-10-CM | POA: Diagnosis not present

## 2024-02-10 DIAGNOSIS — E119 Type 2 diabetes mellitus without complications: Secondary | ICD-10-CM | POA: Diagnosis not present

## 2024-02-10 DIAGNOSIS — M4624 Osteomyelitis of vertebra, thoracic region: Secondary | ICD-10-CM | POA: Diagnosis not present

## 2024-02-10 DIAGNOSIS — I10 Essential (primary) hypertension: Secondary | ICD-10-CM | POA: Diagnosis not present

## 2024-02-10 LAB — GLUCOSE, CAPILLARY
Glucose-Capillary: 131 mg/dL — ABNORMAL HIGH (ref 70–99)
Glucose-Capillary: 132 mg/dL — ABNORMAL HIGH (ref 70–99)

## 2024-02-10 LAB — HEPATITIS B SURFACE ANTIBODY, QUANTITATIVE: Hep B S AB Quant (Post): 6.3 m[IU]/mL — ABNORMAL LOW

## 2024-02-10 MED ORDER — SODIUM CHLORIDE 0.9 % IV SOLN
2.0000 g | Freq: Once | INTRAVENOUS | Status: AC
  Administered 2024-02-10: 2 g via INTRAVENOUS
  Filled 2024-02-10: qty 12.5

## 2024-02-10 MED ORDER — POLYETHYLENE GLYCOL 3350 17 G PO PACK
17.0000 g | PACK | Freq: Every day | ORAL | Status: DC
  Administered 2024-02-10: 17 g via ORAL
  Filled 2024-02-10: qty 1

## 2024-02-10 MED ORDER — VANCOMYCIN HCL IN DEXTROSE 1-5 GM/200ML-% IV SOLN
1000.0000 mg | INTRAVENOUS | Status: DC
  Administered 2024-02-11 – 2024-02-20 (×5): 1000 mg via INTRAVENOUS
  Filled 2024-02-10: qty 200

## 2024-02-10 MED ORDER — VANCOMYCIN HCL 2000 MG/400ML IV SOLN
2000.0000 mg | Freq: Once | INTRAVENOUS | Status: AC
  Administered 2024-02-10: 2000 mg via INTRAVENOUS
  Filled 2024-02-10: qty 400

## 2024-02-10 MED ORDER — OXYCODONE HCL 5 MG PO TABS
5.0000 mg | ORAL_TABLET | Freq: Four times a day (QID) | ORAL | Status: DC | PRN
  Administered 2024-02-10 – 2024-02-12 (×5): 5 mg via ORAL
  Filled 2024-02-10 (×6): qty 1

## 2024-02-10 MED ORDER — HEPARIN SODIUM (PORCINE) 5000 UNIT/ML IJ SOLN
5000.0000 [IU] | Freq: Three times a day (TID) | INTRAMUSCULAR | Status: DC
  Administered 2024-02-10 – 2024-03-14 (×93): 5000 [IU] via SUBCUTANEOUS
  Filled 2024-02-10 (×95): qty 1

## 2024-02-10 MED ORDER — SODIUM CHLORIDE 0.9 % IV SOLN
2.0000 g | INTRAVENOUS | Status: DC
  Administered 2024-02-11 – 2024-03-08 (×13): 2 g via INTRAVENOUS
  Filled 2024-02-10 (×15): qty 12.5

## 2024-02-10 NOTE — Evaluation (Signed)
 Occupational Therapy Evaluation Patient Details Name: Thomas [name] MRN: [id number] DOB: [birth year] Today's Date: 02/10/2024   History of Present Illness   [facility] is a 73 [facility] with back and head strike. 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 PMHx: ESRD on HD, T spine fusion, spinal cord injury T7-T12, chronic thoraic OM, chronic pain, L BKA, 2nd toe ulcer, DM, HTN, gout     Clinical Impressions Patient reports living alone in an apartment with 0 STE and uses RW for functional mobility at baseline.  He also reports being Independent in ADLs and IADLs, except for his friend who transports him to the grocery store and using public transportation.  Patient current presents with increased overall weakness, independence in ADLs ,decreased indep in bed mobility and functional mobility 2/2 spinal pain.  Patient would benefit from additional OT intervention to address functional deficits listed above in order for patient to return to PLOF.      If plan is discharge home, recommend the following:   A lot of help with walking and/or transfers;A lot of help with bathing/dressing/bathroom;Assist for transportation;Assistance with cooking/housework     Functional Status Assessment   Patient has had a recent decline in their functional status and demonstrates the ability to make significant improvements in function in a reasonable and predictable amount of time.     Equipment Recommendations   Hospital bed     Recommendations for Other Services         Precautions/Restrictions   Precautions Precautions: Back;Fall Recall of Precautions/Restrictions: Intact Precaution/Restrictions Comments:  (educated on spinal precautions to protect back) Restrictions Weight Bearing Restrictions Per Provider Order: No     Mobility Bed Mobility Overal bed mobility: Needs Assistance Bed Mobility: Rolling Rolling: Min assist, Used rails               Transfers                          Balance Overall balance assessment:  (unwilling to sit EOB at this time 2/2 pain in spine, RN notified)                                         ADL either performed or assessed with clinical judgement   ADL Overall ADL's : Needs assistance/impaired Eating/Feeding: Set up;Sitting;Bed level   Grooming: Wash/dry face;Wash/dry hands;Set up;Sitting   Upper Body Bathing: Minimal assistance;Sitting;Bed level (HOB elevated)   Lower Body Bathing: Total assistance;Bed level   Upper Body Dressing : Minimal assistance;Bed level   Lower Body Dressing: Total assistance;Bed level       Toileting- Clothing Manipulation and Hygiene: Total assistance;Bed level       Functional mobility during ADLs:  (patient declining to sit EOB 2/2 pain in spine at time of eval)       Vision Baseline Vision/History: 0 No visual deficits Patient Visual Report: No change from baseline Vision Assessment?: No apparent visual deficits     Perception         Praxis         Pertinent Vitals/Pain Pain Assessment Pain Assessment: 0-10 Pain Score: 10-Worst pain ever Pain Location: back pain from neck to base of spine Pain Descriptors / Indicators: Aching Pain Intervention(s): Limited activity within patient's tolerance     Extremity/Trunk Assessment Upper Extremity  Assessment Upper Extremity Assessment: Generalized weakness (AROM to approx 90 degrees, unable to raise higher 2/2 pain in spine)   Lower Extremity Assessment Lower Extremity Assessment: Defer to PT evaluation   Cervical / Trunk Assessment Cervical / Trunk Assessment: Normal   Communication     Cognition Arousal: Alert Behavior During Therapy: WFL for tasks assessed/performed Cognition: No apparent impairments                               Following commands: Intact       Cueing  General Comments          Exercises      Shoulder Instructions      Home Living Family/patient expects to be discharged to:: Private residence Living Arrangements: Alone Available Help at Discharge: Friend(s) (friend takes him to the grocery store) Type of Home: Apartment Home Access: Level entry     Home Layout: One level     Bathroom Shower/Tub: Tub/shower unit (patient sponge bathes at baseline)   Bathroom Toilet: Handicapped height (reports having riser on toilet) Bathroom Accessibility: No   Home Equipment: Agricultural consultant (2 wheels);Crutches (patient reports leaving RW at threshold of door and holding onto counter and objects to walk into bathroom)   Additional Comments: Pt reports he sleeps in a wingback chair      Prior Functioning/Environment Prior Level of Function : Independent/Modified Independent;History of Falls (last six months) (patient reports that his falls have been caused by his prosethic LE loosening 2/2 weight lose per patient report)             Mobility Comments: Pt amb household distances with rolling walker and prosthetic ADLs Comments: sponge baths    OT Problem List: Decreased strength;Decreased activity tolerance;Decreased safety awareness   OT Treatment/Interventions: Self-care/ADL training;Therapeutic exercise;Neuromuscular education;Energy conservation;DME and/or AE instruction;Therapeutic activities;Patient/family education      OT Goals(Current goals can be found in the care plan section)   Acute Rehab OT Goals Patient Stated Goal: to get better and go home OT Goal Formulation: With patient Time For Goal Achievement: 02/24/24 Potential to Achieve Goals: Good   OT Frequency:  Min 1X/week    Co-evaluation              AM-PAC OT "6 Clicks" Daily Activity     Outcome Measure Help from another person eating meals?: None Help from another person taking care of personal grooming?: A Little Help from another person toileting, which includes using toliet, bedpan, or  urinal?: A Lot Help from another person bathing (including washing, rinsing, drying)?: A Lot Help from another person to put on and taking off regular upper body clothing?: A Little Help from another person to put on and taking off regular lower body clothing?: Total 6 Click Score: 15   End of Session Nurse Communication: Patient requests pain meds  Activity Tolerance: Patient limited by pain Patient left: in bed;with call bell/phone within reach  OT Visit Diagnosis: Unsteadiness on feet (R26.81);Repeated falls (R29.6);History of falling (Z91.81);Muscle weakness (generalized) (M62.81)                Time: 1610-9604 OT Time Calculation (min): 25 min Charges:  OT General Charges $OT Visit: 1 Visit OT Evaluation $OT Eval Moderate Complexity: 1 Mod OT Treatments $Self Care/Home Management : 8-22 mins  Lovett Ruck OT/L  Lacretia Piccolo 02/10/2024, 11:54 AM

## 2024-02-10 NOTE — Plan of Care (Signed)
 Pt has rested quietly throughout the night with no distress noted. Alert and oriented but forgetful. ON room air. SR on the monitor. Voids per urinal. Medicated for pain three times with relief noted. Having severe back pain. No other complaints voiced.     Problem: Coping: Goal: Ability to adjust to condition or change in health will improve Outcome: Progressing   Problem: Health Behavior/Discharge Planning: Goal: Ability to manage health-related needs will improve Outcome: Progressing   Problem: Tissue Perfusion: Goal: Adequacy of tissue perfusion will improve Outcome: Progressing   Problem: Education: Goal: Knowledge of General Education information will improve Description: Including pain rating scale, medication(s)/side effects and non-pharmacologic comfort measures Outcome: Progressing   Problem: Clinical Measurements: Goal: Respiratory complications will improve Outcome: Progressing Goal: Cardiovascular complication will be avoided Outcome: Progressing   Problem: Pain Managment: Goal: General experience of comfort will improve and/or be controlled Outcome: Progressing

## 2024-02-10 NOTE — Progress Notes (Signed)
 Pharmacy Antibiotic Note  Thomas [name] is a 73 y.o. male admitted on 02/07/2024 with  osteo/dicistis .  Pharmacy has been consulted for vanc/cefepime  dosing.  Chronic discitis. Can't aspirate per IR and not indicated for intervention per NS. D/w Dr [name] and we will start vanc/cefepime . HD TTS  Recent course of dapto/cefepime .  Plan: Vanc 2g IV x1 then 1g IV TTS Cefepime  2g IV x1 then TTS Levels as needed  Height: 6\' 5"  (195.6 cm) Weight: 93.9 kg (207 lb 0.2 oz) IBW/kg (Calculated) : 89.1  Temp (24hrs), Avg:98 F (36.7 C), Min:97.6 F (36.4 C), Max:98.7 F (37.1 C)  Recent Labs  Lab 02/08/24 0639 02/08/24 0643 02/09/24 0406 02/09/24 0414  WBC 5.3  --   --  5.6  CREATININE 7.79* 8.40* 8.57*  --     Estimated Creatinine Clearance: 10.1 mL/min (A) (by C-G formula based on SCr of 8.57 mg/dL (H)).    Allergies  Allergen Reactions   Other Other (See Comments)    Blood pressure issues Per [facility] hospital: Pt states he can only take these pain meds or else he gets very sick: Oxycontin ,morphine ,demerol, and dilaudid  are the only pain meds pt states he can take.     Oxymorphone Other (See Comments)    Causes kidney problems   Amitriptyline Other (See Comments)   Aspirin Nausea And Vomiting    Gi upset, has kidney issues   Beta Vulgaris Nausea And Vomiting   Buspirone Other (See Comments)    Affected his head    Cabbage Nausea And Vomiting   Codeine Other (See Comments)    Pt does not remember the reaction but knows he can not take    Cyclobenzaprine Nausea And Vomiting   Diflunisal Nausea And Vomiting and Other (See Comments)   Fish Allergy Nausea And Vomiting   Fish-Derived Products Nausea And Vomiting   Hydroxyzine Nausea And Vomiting   Levetiracetam Other (See Comments)   Methadone Other (See Comments)    Made him loopy   Nalbuphine Other (See Comments)    Cutaneous eruption (morphologic abnormality)   Pentazocine Other (See Comments)    Pt does not  remember the reaction    Propoxyphene Other (See Comments)    Pt does not remember the reaction     Shellfish Allergy Nausea And Vomiting   Sulfa Antibiotics Hives   Sulfasalazine Other (See Comments)    Pt does not remember the reaction   Amoxicillin  Nausea And Vomiting and Rash    Per [facility]    Antimicrobials this admission: 4/23 cefepime >> 4/23 vanc>>  Dose adjustments this admission:   Microbiology results: 4/22 blood>>  Ivery Marking, PharmD, [place], AAHIVP, CPP Infectious Disease Pharmacist 02/10/2024 10:32 AM

## 2024-02-10 NOTE — TOC Initial Note (Signed)
 Transition of Care [facility]) - Initial/Assessment Note    Patient Details  Name: Thomas [name] MRN: [id number] Date of Birth: Apr 06, 1951  Transition of [facility]) CM/SW Contact:    Charise Companion, Student-Social Work Phone Number: 02/10/2024, 9:09 AM  Clinical Narrative:                 Pt admitted from home due to fall.  No current TOC needs, please consult as needs arise following therapy eval.         Patient Goals and CMS Choice            Expected Discharge Plan and Services       Living arrangements for the past 2 months: Single Family Home                                      Prior Living Arrangements/Services Living arrangements for the past 2 months: Single Family Home                     Activities of Daily Living   ADL Screening (condition at time of admission) Independently performs ADLs?: Yes (appropriate for developmental age) Is the patient deaf or have difficulty hearing?: No Does the patient have difficulty seeing, even when wearing glasses/contacts?: No Does the patient have difficulty concentrating, remembering, or making decisions?: No  Permission Sought/Granted                  Emotional Assessment       Orientation: : Oriented to Self, Oriented to Place, Oriented to  Time, Oriented to Situation      Admission diagnosis:  End stage renal disease on dialysis (HCC) [N18.6, Z99.2] Osteomyelitis of thoracic spine (HCC) [M46.24] Fall, initial encounter [W19.XXXA] Acute midline thoracic back pain [M54.6] Patient Active Problem List   Diagnosis Date Noted   Acute low back pain 02/09/2024   Fall 02/09/2024   End stage renal disease on dialysis [facility]) 02/09/2024   History of anemia due to chronic kidney disease 02/09/2024   Acute midline thoracic back pain 02/09/2024   Osteomyelitis of thoracic spine (HCC) 02/08/2024   Gabapentin -induced toxicity 01/14/2022   Open wound of left heel 01/14/2022   Acute renal failure  superimposed on stage 4 chronic kidney disease (HCC) 01/13/2022   Chest pain 01/13/2022   UTI (urinary tract infection) 12/31/2021   Chronic anticoagulation 12/31/2021   Prolonged QT interval 12/31/2021   Multifocal pneumonia 12/30/2021   SVT (supraventricular tachycardia) (HCC) 12/11/2021   Obesity, Class I, BMI 30-34.9 12/10/2021   Left leg cellulitis 12/07/2021   Hyperkalemia 10/09/2021   Acquired hypothyroidism 10/09/2021   Hydronephrosis of right kidney 10/09/2021   Hypoglycemia 10/07/2021   Deep vein thrombosis (DVT) of left lower extremity (HCC) 10/07/2021   Acute renal failure superimposed on chronic kidney disease (HCC) 09/17/2021   CAP (community acquired pneumonia) 09/17/2021   Cellulitis of left lower extremity    Pressure injury of skin 06/03/2021   AKI (acute kidney injury) (HCC) 06/02/2021   Pain management    Cord compression (HCC)    Drug-induced constipation    Acute midline low back pain with sciatica    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) 08/30/2020   Urinary retention 12/04/2019   BPH (benign prostatic hyperplasia) 11/24/2019   CKD (chronic kidney disease), stage IV (HCC) 11/24/2019   Chronic pain syndrome 11/24/2019   Physical  deconditioning 11/24/2019   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 [place], [place] - [address] [place] [address] Phone: [phone] Fax: [phone]     Social Drivers of Health (SDOH) Social History: SDOH Screenings   Food Insecurity: No Food Insecurity (02/09/2024)  Housing: Low Risk  (02/09/2024)   Transportation Needs: No Transportation Needs (02/09/2024)  Utilities: Not At Risk (02/09/2024)  Depression (PHQ2-9): Low Risk  (02/08/2020)  Social Connections: Socially Isolated (02/09/2024)  Tobacco Use: Low Risk  (02/07/2024)   SDOH Interventions:     Readmission Risk Interventions    01/15/2022    2:56 PM 01/02/2022    2:03 PM 12/31/2021   10:32 AM  Readmission Risk Prevention Plan  Transportation Screening Complete Complete Complete  Medication Review [employer]) Complete Complete Complete  PCP or Specialist appointment within 3-5 days of discharge Complete Complete Complete  HRI or Home Care Consult Complete Complete Complete  SW Recovery Care/Counseling Consult Complete Complete Complete  Palliative Care Screening Not Applicable Not Applicable Not Applicable  Skilled Nursing Facility Not Applicable Not Applicable Not Applicable

## 2024-02-10 NOTE — TOC Progression Note (Signed)
 Transition of Care [facility]) - Progression Note    Patient Details  Name: Thomas [name] MRN: [id number] Date of Birth: 1951-03-06  Transition [facility] Care [facility]) CM/SW Contact  Jannice Mends, LCSW Phone Number: 02/10/2024, 4:56 PM  Clinical Narrative:    CSW received consult for possible SNF placement at time of discharge. CSW spoke with patient. Patient expressed understanding of PT recommendation and is agreeable to SNF placement at time of discharge. Patient reports preference for one of the facilities in [place]. CSW discussed insurance authorization process and will provide Medicare SNF ratings list. CSW will send out referrals for review of those that can transport to dialysis and provide bed offers as available.    Skilled Nursing Rehab Facilities-   [email or web address]   Ratings out of 5 stars (5 the highest)   Name Address  Phone # Quality Care Staffing Health Inspection Overall  [facility] [address] [phone] 3 3 4 4   [facility] [address] [name] [phone] 5 1 4 4   [facility] [address], [facility] [phone] 2 2 2 2   [facility] [address], [place] [phone] 5 2 4 5   [facility]  [address], [place] [phone] 4 3 5 5   [facility] [address], [facility] [phone] 4 2 2 2   [facility] [address] [place] [phone] 5 1 2 2   [facility] [address] [place] [phone] 2 1 3 2   [address] ([facility]) [address] [place] [phone] 2 3 3 3   [facility] [address] [place], [place] [phone] 3 3 2 2   [facility] [address] [place], [place] [phone] 3 1 1 1   [facility] [address] [place] [phone] 2 3 4 4   [facility] [address] [place] [phone] 4 4 3 3   [facility] ([facility]) [address], [place]  [phone] 1 2 4 3           [facility] [address] [place] [phone] 2 1 4 3   [facility] [address] [place] [phone] 4 2 1 1   [facility]  [address] [place], [place] [address] [phone] 2 2 2 2   [facility] [place] [address] [phone] 3 2 4 4   [facility] [address] [place] [phone] 2 1 2 1   [facility] (No [employer]) [address], [place] [phone] 5 4 5 5   [facility] [address] [facility] [phone] 3 4 2 2   [facility] [address], [place] [phone] 2 1 1 1   [facility] [address] [place] [phone] 4 2 5 5   [facility] [address] [place] [phone] 4 1 1 1   [facility] [address] [place], [place] [phone] 2 2 3 3           [facility]  [phone] 3 1 1 1   [place] [address] [place]  [phone] 3 3 3 3   [name] (No [employer]) [address], [place] [phone] 2 2 4 4   [facility]) [address], [place] [phone] 2 1 1 1   [facility] [address], [place] [phone] 4 3 5 5   [facility] [place] [address], [place] [phone] 1 1 1 1           [facility] [address] [place], [place] [phone] 5 4 5  5  [facility])  [address] [place] [phone] 1 1 2 1   [facility]) [address], [place] [phone]  2 4 4   [facility] [address] [place] [phone] 3 5 5 5   [address] [place], [name] [phone] 4 2 2 2   [facility]) [address] [place] [phone] 2 1 3 2       Expected Discharge Plan: Skilled Nursing Facility Barriers to Discharge: Continued Medical Work up, English as a second language teacher, SNF Pending bed offer  Expected Discharge Plan and Services     Post Acute Care Choice: Skilled Nursing  Facility Living arrangements for the past 2 months: Single Family Home                                       Social Determinants of Health (SDOH) Interventions SDOH Screenings   Food Insecurity: No Food Insecurity (02/09/2024)  Housing: Low Risk  (02/09/2024)  Transportation Needs: No Transportation Needs (02/09/2024)  Utilities: Not At Risk (02/09/2024)  Depression (PHQ2-9): Low Risk  (02/08/2020)  Social Connections: Socially Isolated (02/09/2024)  Tobacco Use: Low Risk  (02/07/2024)    Readmission Risk Interventions    01/15/2022    2:56 PM 01/02/2022    2:03 PM 12/31/2021   10:32 AM  Readmission Risk Prevention Plan  Transportation Screening Complete Complete Complete  Medication Review (RN Care Manager) Complete Complete Complete  PCP or Specialist appointment within 3-5 days of discharge Complete Complete Complete  HRI or Home Care Consult Complete Complete Complete  SW Recovery Care/Counseling Consult Complete Complete Complete  Palliative Care Screening Not Applicable Not Applicable Not Applicable  Skilled Nursing Facility Not Applicable Not Applicable Not Applicable

## 2024-02-10 NOTE — Progress Notes (Signed)
 PROGRESS NOTE        PATIENT DETAILS Name: Thomas [name] Age: 73 y.o. Sex: male Date of Birth: 08-Sep-1951 Admit Date: 02/07/2024 Admitting Physician Roxana Copier, DO ZOX:WRUEAV, [name], MD  Brief Summary: Patient is a 73 y.o.  male who with history of ESRD on HD TTS, history of posterior fusion at T10-12 due to osteomyelitis/discitis/epidural abscess in 2021, history of right BKA who presented with low back pain-upon further evaluation with CT imaging-he was found to have potential acute osteomyelitis involving his thoracic spine and admitted to the hospitalist service.  Significant events: 4/20>> admit to [facility]-back pain-concern for thoracic osteomyelitis.  Significant studies: 4/21>> x-ray right elbow: No fracture. 4/21>> x-ray left wrist: No fracture 4/21>> x-ray left shoulder, no fracture. 4/21>> CT head: No acute intracranial abnormality 4/21>> CT C-spine: No fracture. 4/21>> CT abdomen/pelvis: No acute injury to chest/abdomen/pelvis 4/21>> CT T-spine: Prior posterior fusion from T10-T12-abnormal appearance with collapse of T10-lucency around T10 screws at the T9-10 disc space, T10-11 disc space concerning for discitis/osteomyelitis. 4/21>> CT L-spine: No acute bony abnormality, mild chronic compression fracture at L1. 4/22>> x-ray left foot: Chronic erosive changes about the forefoot similar to 11/21/2023-osteomyelitis/septic arthritis of the 1st/2nd MTP joints difficult to exclude.  Significant microbiology data: 4/22>> blood culture: No growth  Procedures: None  Consults: Neurosurgery Infectious disease  Subjective: Lying comfortably in bed-denies any chest pain or shortness of breath.  Continues to complain of some pain in his back but appears comfortable.  Objective: Vitals: Blood pressure 137/86, pulse 66, temperature 98.3 F (36.8 C), temperature source Oral, resp. rate 18, height 6\' 5"  (1.956 m), weight 93.9 kg, SpO2 100%.    Exam: Gen Exam:Alert awake-not in any distress HEENT:atraumatic, normocephalic Chest: B/L clear to auscultation anteriorly CVS:S1S2 regular Abdomen:soft non tender, non distended Extremities: Right BKA Neurology: Non focal Skin: no rash  Pertinent Labs/Radiology:    Latest Ref Rng & Units 02/09/2024    4:14 AM 02/08/2024    6:43 AM 02/08/2024    6:39 AM  CBC  WBC 4.0 - 10.5 K/uL 5.6   5.3   Hemoglobin 13.0 - 17.0 g/dL 40.9  9.9  81.1   Hematocrit 39.0 - 52.0 % 32.5  29.0  32.1   Platelets 150 - 400 K/uL 169   151     Lab Results  Component Value Date   NA 138 02/09/2024   K 5.7 (H) 02/09/2024   CL 101 02/09/2024   CO2 21 (L) 02/09/2024      Assessment/Plan: 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 Note-refuses MRI-per patient-he had a syncopal event with prior MRI-and his cardiologist does not want him to undergo any further MR studies.  Mechanical fall Numerous imaging studies negative for fracture PT/OT eval.  ESRD on HD TTS Nephrology following and directing HD care  Normocytic anemia Secondary to ESRD Iron /iron  defer to nephrology service.  Chronic HFpEF Euvolemic on exam Fluid removal with HD  HTN BP stable Clonidine /amlodipine /losartan   HLD Statin  Hypothyroidism Synthroid   BPH Flomax /bethanechol   Chronic left second toe ulceration X-ray with stable changes-no active infection noted On IV antibiotics x 6 weeks in any event Outpatient follow-up with PCP/wound care  History of right BKA  BMI: Estimated body mass index is 24.55 kg/m as calculated from the following:   Height as of this encounter: 6\' 5"  (1.956 m).   Weight as of this encounter: 93.9 kg.   Code status:   Code Status: Full Code   DVT  Prophylaxis: heparin  injection 5,000 Units Start: 02/10/24 1400 SCDs Start: 02/08/24 2206   Family Communication: None at bedside   Disposition Plan: Status is: Inpatient Remains inpatient appropriate because: Severity of illness   Planned Discharge Destination:Home health   Diet: Diet Order             Diet regular Room service appropriate? Yes; Fluid consistency: Thin  Diet effective now                     Antimicrobial agents: Anti-infectives (From admission, onward)    Start     Dose/Rate Route Frequency Ordered Stop   02/11/24 1800  ceFEPIme  (MAXIPIME ) 2 g in sodium chloride  0.9 % 100 mL IVPB        2 g 200 mL/hr over 30 Minutes Intravenous Every T-Th-Sa (Hemodialysis) 02/10/24 1028     02/11/24 1200  vancomycin  (VANCOCIN ) IVPB 1000 mg/200 mL premix        1,000 mg 200 mL/hr over 60 Minutes Intravenous Every T-Th-Sa (Hemodialysis) 02/10/24 1028     02/10/24 1115  vancomycin  (VANCOREADY) IVPB 2000 mg/400 mL        2,000 mg 200 mL/hr over 120 Minutes Intravenous  Once 02/10/24 1028     02/10/24 1115  ceFEPIme  (MAXIPIME ) 2 g in sodium chloride  0.9 % 100 mL IVPB        2 g 200 mL/hr over 30 Minutes Intravenous  Once 02/10/24 1028          MEDICATIONS: Scheduled Meds:  amLODipine   10 mg Oral Daily   atorvastatin   80 mg Oral Daily   bethanechol   10 mg Oral BID   Chlorhexidine  Gluconate Cloth  6 each Topical Q0600   cloNIDine   0.1 mg Oral TID   heparin  injection (subcutaneous)  5,000 Units Subcutaneous Q8H   hydrALAZINE   25 mg Oral TID   leptospermum manuka honey  1 Application Topical Daily   levothyroxine   50 mcg Oral QAC breakfast   lidocaine   1 patch Transdermal Once   losartan   50 mg Oral Daily   metoprolol  tartrate  50 mg Oral BID   senna-docusate  4 tablet Oral BID   sevelamer  carbonate  1,600 mg Oral TID WC   tamsulosin   0.4 mg Oral Daily   Continuous Infusions:  ceFEPime  (MAXIPIME ) IV     [START ON 02/11/2024] ceFEPime  (MAXIPIME ) IV      [START ON 02/11/2024] vancomycin      vancomycin      PRN Meds:.acetaminophen  **OR** acetaminophen , HYDROmorphone  (DILAUDID ) injection, melatonin, naLOXone  (NARCAN )  injection, ondansetron  (ZOFRAN ) IV, tiZANidine    I have personally reviewed following labs and imaging studies  LABORATORY DATA: CBC: Recent Labs  Lab 02/08/24 0639 02/08/24 0643 02/09/24 0414  WBC 5.3  --  5.6  NEUTROABS  --   --  3.8  HGB 10.0* 9.9* 10.3*  HCT 32.1* 29.0* 32.5*  MCV 109.6*  --  108.0*  PLT 151  --  169    Basic Metabolic Panel: Recent Labs  Lab 02/08/24 0639 02/08/24 0643 02/09/24 0406  NA 139 138 138  K 4.8 4.8 5.7*  CL 99 103 101  CO2 24  --  21*  GLUCOSE 80  76 58*  BUN 41* 39* 47*  CREATININE 7.79* 8.40* 8.57*  CALCIUM  8.1*  --  8.0*  MG  --   --  2.4  PHOS  --   --  8.2*    GFR: Estimated Creatinine Clearance: 10.1 mL/min (A) (by C-G formula based on SCr of 8.57 mg/dL (H)).  Liver Function Tests: Recent Labs  Lab 02/08/24 0639 02/09/24 0406  AST 22 20  ALT 9 8  ALKPHOS 45 50  BILITOT 0.7 0.9  PROT 8.6* 8.2*  ALBUMIN  3.5 3.4*   No results for input(s): "LIPASE", "AMYLASE" in the last 168 hours. No results for input(s): "AMMONIA" in the last 168 hours.  Coagulation Profile: Recent Labs  Lab 02/08/24 0639 02/09/24 1931  INR 1.1 1.1    Cardiac Enzymes: No results for input(s): "CKTOTAL", "CKMB", "CKMBINDEX", "TROPONINI" in the last 168 hours.  BNP (last 3 results) No results for input(s): "PROBNP" in the last 8760 hours.  Lipid Profile: No results for input(s): "CHOL", "HDL", "LDLCALC", "TRIG", "CHOLHDL", "LDLDIRECT" in the last 72 hours.  Thyroid Function Tests: No results for input(s): "TSH", "T4TOTAL", "FREET4", "T3FREE", "THYROIDAB" in the last 72 hours.  Anemia Panel: Recent Labs    02/09/24 0406  VITAMINB12 1,297*  FOLATE 15.9    Urine analysis:    Component Value Date/Time   COLORURINE YELLOW 01/14/2022 0248   APPEARANCEUR HAZY (A)  01/14/2022 0248   LABSPEC 1.009 01/14/2022 0248   PHURINE 7.0 01/14/2022 0248   GLUCOSEU NEGATIVE 01/14/2022 0248   HGBUR MODERATE (A) 01/14/2022 0248   BILIRUBINUR NEGATIVE 01/14/2022 0248   KETONESUR NEGATIVE 01/14/2022 0248   PROTEINUR 100 (A) 01/14/2022 0248   NITRITE NEGATIVE 01/14/2022 0248   LEUKOCYTESUR LARGE (A) 01/14/2022 0248    Sepsis Labs: Lactic Acid, Venous    Component Value Date/Time   LATICACIDVEN 1.0 12/31/2021 0048    MICROBIOLOGY: Recent Results (from the past 240 hours)  Culture, blood (Routine X 2) w Reflex to ID Panel     Status: None (Preliminary result)   Collection Time: 02/09/24  4:06 AM   Specimen: BLOOD  Result Value Ref Range Status   Specimen Description BLOOD BLOOD RIGHT HAND  Final   Special Requests   Final    BOTTLES DRAWN AEROBIC ONLY Blood Culture adequate volume   Culture   Final    NO GROWTH 1 DAY Performed at [facility], [address] [place], [place] [address]    Report Status PENDING  Incomplete  Culture, blood (Routine X 2) w Reflex to ID Panel     Status: None (Preliminary result)   Collection Time: 02/09/24  4:14 AM   Specimen: BLOOD  Result Value Ref Range Status   Specimen Description BLOOD BLOOD RIGHT HAND AEROBIC BOTTLE ONLY  Final   Special Requests   Final    BOTTLES DRAWN AEROBIC ONLY Blood Culture adequate volume   Culture   Final    NO GROWTH 1 DAY Performed at [facility], [address], [place], [place] [address]    Report Status PENDING  Incomplete    RADIOLOGY STUDIES/RESULTS: DG Foot 2 Views Left Result Date: 02/09/2024 CLINICAL DATA:  Skin ulcer of the left great toe EXAM: LEFT FOOT - 2 VIEW COMPARISON:  11/21/2023 FINDINGS: Chronic erosive changes at the first, second, and fourth MTP joints. Additional chronic erosive changes about the 2nd-5th proximal phalanges. Advanced midfoot degenerative arthritis. Pes planus. Soft tissue swelling about the forefoot and great toe. IMPRESSION: Chronic  erosive changes about the forefoot are similar to 11/21/2023. Osteomyelitis/septic arthritis of the first and second MTP joints is difficult to exclude. Consider MRI for further evaluation. Electronically Signed   By: Rozell Cornet M.D.   On: 02/09/2024 20:26   CT CHEST ABDOMEN PELVIS WO CONTRAST Addendum Date: 02/08/2024 ADDENDUM REPORT: 02/08/2024 14:27 ADDENDUM: After comparing to CT thoracic spine performed today, the abnormal changes in the lower thoracic spine include collapse of the T10 vertebral body, lucency around the T10 screws, T9-10 disc space and T10-11 disc space with abnormal surrounding paraspinal soft tissue swelling. Findings remain concerning for discitis/osteomyelitis. Electronically Signed   By: Janeece Mechanic M.D.   On: 02/08/2024 14:27   Result Date: 02/08/2024 CLINICAL DATA:  Polytrauma, blunt.  Fall. EXAM: CT CHEST, ABDOMEN AND PELVIS WITHOUT CONTRAST TECHNIQUE: Multidetector CT imaging of the chest, abdomen and pelvis was performed following the standard protocol without IV contrast. RADIATION DOSE REDUCTION: This exam was performed according to the departmental dose-optimization program which includes automated exposure control, adjustment of the mA and/or kV according to patient size and/or use of iterative reconstruction technique. COMPARISON:  01/12/2024 FINDINGS: CT CHEST FINDINGS Cardiovascular: Scattered coronary artery and aortic calcifications. Heart is normal size. Aorta is normal caliber. Mediastinum/Nodes: No mediastinal, hilar, or axillary adenopathy. Trachea and esophagus are unremarkable. Thyroid unremarkable. Lungs/Pleura: Chronic changes in the lungs with areas of scarring bilaterally. No effusions or pneumothorax. Musculoskeletal: Postoperative changes with posterior spinal hardware at T10, T12 and extending through the lumbar spine. Stable lucency noted around the posterior spinal screws at T10 and at the T10-11 disc space. There is abnormal paraspinal soft tissue  extending from T9-10 through T10-11. Appearance is concerning for possible with discitis/osteomyelitis. CT ABDOMEN PELVIS FINDINGS Hepatobiliary: No focal hepatic abnormality. Gallbladder unremarkable. Pancreas: No focal abnormality or ductal dilatation. Spleen: No focal abnormality.  Normal size. Adrenals/Urinary Tract: Adrenal glands normal. Severe chronic right hydronephrosis is stable when compared to prior study. Overlying cortical thinning. No hydronephrosis on the left. Urinary bladder unremarkable. Stomach/Bowel: Normal appendix. Stomach, large and small bowel grossly unremarkable. Vascular/Lymphatic: No evidence of aneurysm or adenopathy. Aortic atherosclerosis. Infrarenal IVC filter noted. Reproductive: No visible focal abnormality. Other: No free fluid or free air. Musculoskeletal: Postoperative changes in the lumbar spine with severe degenerative changes throughout. Mild compression fracture at L1 is stable. Severe diffuse osteopenia. IMPRESSION: No acute traumatic injury in the chest, abdomen or pelvis. Abnormal appearance at T10 and T10-11 with bone destruction surrounding the posterior pedicle screws at T10 and surrounding the T10-11 disc space. Adjacent abnormal paraspinal soft tissues. Appearance is concerning for possible discitis/osteomyelitis. Consider further evaluation with MRI. Chronic changes in the lungs with areas of scarring throughout. Coronary artery disease, aortic atherosclerosis. Severe chronic right hydronephrosis is stable since prior study. Electronically Signed: By: Janeece Mechanic M.D. On: 02/08/2024 14:10   CT Cervical Spine Wo Contrast Result Date: 02/08/2024 CLINICAL DATA:  Neck trauma (Age >= 65y).  Fall EXAM: CT CERVICAL SPINE WITHOUT CONTRAST TECHNIQUE: Multidetector CT imaging of the cervical spine was performed without intravenous contrast. Multiplanar CT image reconstructions were also generated. RADIATION DOSE REDUCTION: This exam was performed according to the  departmental dose-optimization program which includes automated exposure control, adjustment of the mA and/or kV according to patient size and/or use of iterative reconstruction technique. COMPARISON:  02/05/2024 FINDINGS: Alignment: Degenerative anterolisthesis in the lower cervical spine, stable. Skull base and vertebrae: No acute fracture. Mild irregularity and collapse of the C5, C6 and C7 vertebral bodies is stable  since prior study and likely related to advanced degenerative changes. Soft tissues and spinal canal: No prevertebral fluid or swelling. No visible canal hematoma. Disc levels: Advanced degenerative disc disease at C5-6 through C7-T1 with endplate irregularity and collapse noted at C5, C6 and C7 likely related to advanced degenerative changes. This is stable since prior study. Upper chest: No acute findings Other: None IMPRESSION: No acute bony abnormality. Advanced degenerative disc disease at C5-6 through C7-T1. Electronically Signed   By: Janeece Mechanic M.D.   On: 02/08/2024 14:24   CT Head Wo Contrast Result Date: 02/08/2024 CLINICAL DATA:  Head trauma, minor (Age >= 65y).  Fall. EXAM: CT HEAD WITHOUT CONTRAST TECHNIQUE: Contiguous axial images were obtained from the base of the skull through the vertex without intravenous contrast. RADIATION DOSE REDUCTION: This exam was performed according to the departmental dose-optimization program which includes automated exposure control, adjustment of the mA and/or kV according to patient size and/or use of iterative reconstruction technique. COMPARISON:  12/22/2023 FINDINGS: Brain: Diffuse cerebral atrophy. No acute intracranial abnormality. Specifically, no hemorrhage, hydrocephalus, mass lesion, acute infarction, or significant intracranial injury. Vascular: No hyperdense vessel or unexpected calcification. Skull: No acute calvarial abnormality. Sinuses/Orbits: No acute findings Other: None IMPRESSION: Atrophy.  No acute intracranial abnormality.  Electronically Signed   By: Janeece Mechanic M.D.   On: 02/08/2024 14:21   CT T-SPINE NO CHARGE Result Date: 02/08/2024 CLINICAL DATA:  Fall, trauma EXAM: CT THORACIC SPINE WITHOUT CONTRAST TECHNIQUE: Multidetector CT images of the thoracic were obtained using the standard protocol without intravenous contrast. RADIATION DOSE REDUCTION: This exam was performed according to the departmental dose-optimization program which includes automated exposure control, adjustment of the mA and/or kV according to patient size and/or use of iterative reconstruction technique. COMPARISON:  Thoracic MRI 09/17/2021 FINDINGS: Alignment: Normal Vertebrae: Postoperative changes from posterior fusion from T10-T12. There is collapse of the T10 vertebral body with abnormal lucency around the T10 screws, the T9-10 disc base and T10-11 disc space. Surrounding paraspinal soft tissue swelling. Appearance is concerning for discitis/osteomyelitis. Paraspinal and other soft tissues: Abnormal paraspinal soft tissue from T9-T11. Disc levels: Advanced degenerative changes in the lower thoracic spine and lower cervical spine. IMPRESSION: No acute traumatic findings. Prior posterior fusion from T10-T12. Abnormal appearance with collapse of the T10 vertebral body, lucency around the T10 screws, at the T9-10 disc space and T10-11 disc space concerning for discitis/osteomyelitis. Abnormal surrounding paraspinal soft tissue swelling. Electronically Signed   By: Janeece Mechanic M.D.   On: 02/08/2024 14:19   CT L-SPINE NO CHARGE Result Date: 02/08/2024 CLINICAL DATA:  Fall, trauma EXAM: CT LUMBAR SPINE WITHOUT CONTRAST TECHNIQUE: Multidetector CT imaging of the lumbar spine was performed without intravenous contrast administration. Multiplanar CT image reconstructions were also generated. RADIATION DOSE REDUCTION: This exam was performed according to the departmental dose-optimization program which includes automated exposure control, adjustment of the mA  and/or kV according to patient size and/or use of iterative reconstruction technique. COMPARISON:  CT abdomen and pelvis 01/01/2024. FINDINGS: Segmentation: 5 lumbar type vertebrae. Alignment: No subluxation Vertebrae: Severe diffuse osteopenia. Mild chronic compression fracture at L1 is stable since prior study. Postoperative changes from posterior fusion. Advanced degenerative disc and facet disease. Fusion of the facets diffusely. Paraspinal and other soft tissues: No paraspinal soft tissue abnormality in the lumbar spine. Disc levels: Severe diffuse degenerative disc disease and facet disease. Fusion across the facet joints diffusely. IMPRESSION: No acute bony abnormality. Mild chronic compression fracture at L1. Severe osteopenia. Posterior fusion  changes throughout the lumbar spine and extending into the lower thoracic spine. Severe diffuse degenerative disc and facet disease with fusion across the facet joints. Electronically Signed   By: Janeece Mechanic M.D.   On: 02/08/2024 14:16     LOS: 2 days   Kimberly Penna, MD  [facility]    To contact the attending provider between 7A-7P or the covering provider during after hours 7P-7A, please log into the web site [email or web address] and access using universal [place] password for that web site. If you do not have the password, please call the hospital operator.  02/10/2024, 11:43 AM

## 2024-02-10 NOTE — Evaluation (Signed)
 Physical Therapy Evaluation Patient Details Name: Thomas [name] MRN: [id number] DOB: 1951-06-21 Today's Date: 02/10/2024  History of Present Illness  The pt is a 73 yo male presenting 4/20 with back pain after a fall at home. 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 PMH includes: ESRD on HD TTS, prior thoracic spine fusion, spinal 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-T12, chronic thoracic osteomyelitis, chronic pain syndrome, gout, PE not on anticoagulation, DM-2, right BKA, left second toe ulcer and HTN.    Clinical Impression  Pt in bed upon arrival of PT, agreeable to evaluation at this time. Prior to admission the pt was independent with use of RW, reports largely sedentary at home but living alone and independent with self care and mobility. He does report frequent falls (x5 this year) and recent difficulties with prosthetic falling off. The pt presents with 8/10 pain at rest and reports that with any movement it increases to 10/10 despite attempts at premedication. The pt tolerated HOB to 28 deg briefly, but then requested return to 21. Only able to demo LE movement and exercises without reports of change in pain at this time. Pt aware he needs to attempt mobility, agreeable to further movement only with improved pain control. Given lack of assistance and pt's current inability to move, recommend post-acute rehab <3 hours/day to facilitate return to prior level of independence.     If plan is discharge home, recommend the following: Two people to help with walking and/or transfers;Two people to help with bathing/dressing/bathroom;Assistance with cooking/housework;Assistance with feeding;Direct supervision/assist for medications management;Direct supervision/assist for financial management;Assist for transportation;Help with stairs or ramp for entrance   Can travel by private vehicle        Equipment Recommendations Wheelchair (measurements PT);Wheelchair cushion (measurements PT);Hospital  bed;Hoyer lift  Recommendations for Other Services       Functional Status Assessment Patient has had a recent decline in their functional status and demonstrates the ability to make significant improvements in function in a reasonable and predictable amount of time.     Precautions / Restrictions Precautions Precautions: Back;Fall Recall of Precautions/Restrictions: Intact Precaution/Restrictions Comments:  (educated on spinal precautions to protect back) Restrictions Weight Bearing Restrictions Per Provider Order: No      Mobility  Bed Mobility Overal bed mobility: Needs Assistance Bed Mobility: Rolling Rolling: Min assist, Used rails         General bed mobility comments: pt declining more than small movements in bed, is able to use UE to assist, tolerating HOB elevation to 28 deg elevation briefly, but more comfortable at 21 deg     Balance Overall balance assessment: History of Falls (unwilling to sit EOB at this time 2/2 pain in spine, RN notified)                                           Pertinent Vitals/Pain Pain Assessment Pain Assessment: 0-10 Pain Score: 8  Pain Location: 8 at rest, 10 with any movement Pain Descriptors / Indicators: Aching, Sore Pain Intervention(s): Limited activity within patient's tolerance, Monitored during session, Premedicated before session (pt declined heat or ice)    Home Living Family/patient expects to be discharged to:: Private residence Living Arrangements: Alone Available Help at Discharge: Friend(s) (friend takes him to the grocery store) Type of Home: Apartment Home Access: Level entry       Home Layout: One level Home Equipment: Agricultural consultant (2  wheels);Crutches (patient reports leaving RW at threshold of door and holding onto counter and objects to walk into bathroom) Additional Comments: Pt reports he sleeps in a wingback chair    Prior Function Prior Level of Function : Independent/Modified  Independent;History of Falls (last six months) (patient reports that his falls have been caused by his prosethic LE loosening 2/2 weight lose per patient report)             Mobility Comments: Pt amb household distances with rolling walker and prosthetic ADLs Comments: sponge baths     Extremity/Trunk Assessment   Upper Extremity Assessment Upper Extremity Assessment: Defer to OT evaluation    Lower Extremity Assessment Lower Extremity Assessment: RLE deficits/detail;LLE deficits/detail RLE Deficits / Details: chronic BKA, grossly functional ROM and MMT at hip. pt would not allow testing of hip extension due to back pain RLE: Unable to fully assess due to pain RLE Sensation: decreased light touch (at end of stump) LLE Deficits / Details: limited ROM at ankle, good ROM at knee and hip, grossly 4/5 to MMT LLE: Unable to fully assess due to pain LLE Sensation: history of peripheral neuropathy;decreased light touch LLE Coordination: WNL    Cervical / Trunk Assessment Cervical / Trunk Assessment: Kyphotic;Other exceptions Cervical / Trunk Exceptions: significant kyphosis, hx of neck fx and surgery, significant body habitus  Communication   Communication Communication: No apparent difficulties    Cognition Arousal: Alert Behavior During Therapy: WFL for tasks assessed/performed                           PT - Cognition Comments: pt able to answer all questions, not willing to attempt movement due to fear of pain and not able to be convinced. at times tangential and interrupting PT education to talk about other topics Following commands: Intact       Cueing Cueing Techniques: Verbal cues     General Comments General comments (skin integrity, edema, etc.): VSS on RA    Exercises General Exercises - Lower Extremity Ankle Circles/Pumps: AAROM, Left, 10 reps Quad Sets: AROM, Both, 10 reps Short Arc Quad: AROM, Left, 10 reps Heel Slides: AROM, Both, 5 reps Hip  ABduction/ADduction: AROM, Both, 10 reps Other Exercises Other Exercises: abdominal bracing 5x5 sec   Assessment/Plan    PT Assessment Patient needs continued PT services  PT Problem List Decreased strength;Decreased activity tolerance;Decreased range of motion;Decreased balance;Decreased mobility;Impaired sensation;Pain       PT Treatment Interventions DME instruction;Gait training;Stair training;Functional mobility training;Therapeutic activities;Therapeutic exercise;Balance training;Patient/family education    PT Goals (Current goals can be found in the Care Plan section)  Acute Rehab PT Goals Patient Stated Goal: to reduce pain PT Goal Formulation: With patient Time For Goal Achievement: 02/24/24 Potential to Achieve Goals: Fair    Frequency Min 2X/week        AM-PAC PT "6 Clicks" Mobility  Outcome Measure Help needed turning from your back to your side while in a flat bed without using bedrails?: Total Help needed moving from lying on your back to sitting on the side of a flat bed without using bedrails?: Total Help needed moving to and from a bed to a chair (including a wheelchair)?: Total Help needed standing up from a chair using your arms (e.g., wheelchair or bedside chair)?: Total Help needed to walk in hospital room?: Total Help needed climbing 3-5 steps with a railing? : Total 6 Click Score: 6    End of Session  Activity Tolerance: Patient limited by pain Patient left: in bed;with call bell/phone within reach Nurse Communication: Mobility status PT Visit Diagnosis: Unsteadiness on feet (R26.81);Other abnormalities of gait and mobility (R26.89);Repeated falls (R29.6);Muscle weakness (generalized) (M62.81);Pain Pain - part of body:  (back)    Time: 1001-1025 PT Time Calculation (min) (ACUTE ONLY): 24 min   Charges:   PT Evaluation $PT Eval Low Complexity: 1 Low PT Treatments $Therapeutic Exercise: 8-22 mins PT General Charges $$ ACUTE PT VISIT: 1  Visit         Barnabas Booth, PT, DPT   Acute Rehabilitation Department Office [phone] Secure Chat Communication Preferred  Lona Rist 02/10/2024, 12:21 PM

## 2024-02-10 NOTE — Progress Notes (Signed)
 [facility] Progress Note   Subjective:   Seen inr oom - c/o worsened back pain. Also with severe cramping at the end of HD yesterday, better at  this time. No CP/dyspnea.  Objective Vitals:   02/10/24 0355 02/10/24 0400 02/10/24 0410 02/10/24 0953  BP: (!) 106/59   137/86  Pulse: 66     Resp: 15 18    Temp: 98.3 F (36.8 C)     TempSrc: Oral     SpO2: 100%     Weight:   93.9 kg   Height:       Physical Exam General: Well appearing man, NAD. Room air Heart: RRR; 2/6 murmur Lungs: CTAB Abdomen: soft Extremities: R BKA, no LLE edema, dry scabbed skin and bandaged toe Dialysis Access: [facility]  Additional Objective Labs: Basic Metabolic Panel: Recent Labs  Lab 02/08/24 0639 02/08/24 0643 02/09/24 0406  NA 139 138 138  K 4.8 4.8 5.7*  CL 99 103 101  CO2 24  --  21*  GLUCOSE 80 76 58*  BUN 41* 39* 47*  CREATININE 7.79* 8.40* 8.57*  CALCIUM  8.1*  --  8.0*  PHOS  --   --  8.2*   Liver Function Tests: Recent Labs  Lab 02/08/24 0639 02/09/24 0406  AST 22 20  ALT 9 8  ALKPHOS 45 50  BILITOT 0.7 0.9  PROT 8.6* 8.2*  ALBUMIN  3.5 3.4*   CBC: Recent Labs  Lab 02/08/24 0639 02/08/24 0643 02/09/24 0414  WBC 5.3  --  5.6  NEUTROABS  --   --  3.8  HGB 10.0* 9.9* 10.3*  HCT 32.1* 29.0* 32.5*  MCV 109.6*  --  108.0*  PLT 151  --  169   Studies/Results: DG Foot 2 Views Left Result Date: 02/09/2024 CLINICAL DATA:  Skin ulcer of the left great toe EXAM: LEFT FOOT - 2 VIEW COMPARISON:  11/21/2023 FINDINGS: Chronic erosive changes at the first, second, and fourth MTP joints. Additional chronic erosive changes about the 2nd-5th proximal phalanges. Advanced midfoot degenerative arthritis. Pes planus. Soft tissue swelling about the forefoot and great toe. IMPRESSION: Chronic erosive changes about the forefoot are similar to 11/21/2023. Osteomyelitis/septic arthritis of the first and second MTP joints is difficult to exclude. Consider MRI for further evaluation.  Electronically Signed   By: Rozell Cornet M.D.   On: 02/09/2024 20:26   CT CHEST ABDOMEN PELVIS WO CONTRAST Addendum Date: 02/08/2024 ADDENDUM REPORT: 02/08/2024 14:27 ADDENDUM: After comparing to CT thoracic spine performed today, the abnormal changes in the lower thoracic spine include collapse of the T10 vertebral body, lucency around the T10 screws, T9-10 disc space and T10-11 disc space with abnormal surrounding paraspinal soft tissue swelling. Findings remain concerning for discitis/osteomyelitis. Electronically Signed   By: Janeece Mechanic M.D.   On: 02/08/2024 14:27   Result Date: 02/08/2024 CLINICAL DATA:  Polytrauma, blunt.  Fall. EXAM: CT CHEST, ABDOMEN AND PELVIS WITHOUT CONTRAST TECHNIQUE: Multidetector CT imaging of the chest, abdomen and pelvis was performed following the standard protocol without IV contrast. RADIATION DOSE REDUCTION: This exam was performed according to the departmental dose-optimization program which includes automated exposure control, adjustment of the mA and/or kV according to patient size and/or use of iterative reconstruction technique. COMPARISON:  01/12/2024 FINDINGS: CT CHEST FINDINGS Cardiovascular: Scattered coronary artery and aortic calcifications. Heart is normal size. Aorta is normal caliber. Mediastinum/Nodes: No mediastinal, hilar, or axillary adenopathy. Trachea and esophagus are unremarkable. Thyroid unremarkable. Lungs/Pleura: Chronic changes in the lungs with areas of  scarring bilaterally. No effusions or pneumothorax. Musculoskeletal: Postoperative changes with posterior spinal hardware at T10, T12 and extending through the lumbar spine. Stable lucency noted around the posterior spinal screws at T10 and at the T10-11 disc space. There is abnormal paraspinal soft tissue extending from T9-10 through T10-11. Appearance is concerning for possible with discitis/osteomyelitis. CT ABDOMEN PELVIS FINDINGS Hepatobiliary: No focal hepatic abnormality. Gallbladder  unremarkable. Pancreas: No focal abnormality or ductal dilatation. Spleen: No focal abnormality.  Normal size. Adrenals/Urinary Tract: Adrenal glands normal. Severe chronic right hydronephrosis is stable when compared to prior study. Overlying cortical thinning. No hydronephrosis on the left. Urinary bladder unremarkable. Stomach/Bowel: Normal appendix. Stomach, large and small bowel grossly unremarkable. Vascular/Lymphatic: No evidence of aneurysm or adenopathy. Aortic atherosclerosis. Infrarenal IVC filter noted. Reproductive: No visible focal abnormality. Other: No free fluid or free air. Musculoskeletal: Postoperative changes in the lumbar spine with severe degenerative changes throughout. Mild compression fracture at L1 is stable. Severe diffuse osteopenia. IMPRESSION: No acute traumatic injury in the chest, abdomen or pelvis. Abnormal appearance at T10 and T10-11 with bone destruction surrounding the posterior pedicle screws at T10 and surrounding the T10-11 disc space. Adjacent abnormal paraspinal soft tissues. Appearance is concerning for possible discitis/osteomyelitis. Consider further evaluation with MRI. Chronic changes in the lungs with areas of scarring throughout. Coronary artery disease, aortic atherosclerosis. Severe chronic right hydronephrosis is stable since prior study. Electronically Signed: By: Janeece Mechanic M.D. On: 02/08/2024 14:10   CT Cervical Spine Wo Contrast Result Date: 02/08/2024 CLINICAL DATA:  Neck trauma (Age >= 65y).  Fall EXAM: CT CERVICAL SPINE WITHOUT CONTRAST TECHNIQUE: Multidetector CT imaging of the cervical spine was performed without intravenous contrast. Multiplanar CT image reconstructions were also generated. RADIATION DOSE REDUCTION: This exam was performed according to the departmental dose-optimization program which includes automated exposure control, adjustment of the mA and/or kV according to patient size and/or use of iterative reconstruction technique.  COMPARISON:  02/05/2024 FINDINGS: Alignment: Degenerative anterolisthesis in the lower cervical spine, stable. Skull base and vertebrae: No acute fracture. Mild irregularity and collapse of the C5, C6 and C7 vertebral bodies is stable since prior study and likely related to advanced degenerative changes. Soft tissues and spinal canal: No prevertebral fluid or swelling. No visible canal hematoma. Disc levels: Advanced degenerative disc disease at C5-6 through C7-T1 with endplate irregularity and collapse noted at C5, C6 and C7 likely related to advanced degenerative changes. This is stable since prior study. Upper chest: No acute findings Other: None IMPRESSION: No acute bony abnormality. Advanced degenerative disc disease at C5-6 through C7-T1. Electronically Signed   By: Janeece Mechanic M.D.   On: 02/08/2024 14:24   CT Head Wo Contrast Result Date: 02/08/2024 CLINICAL DATA:  Head trauma, minor (Age >= 65y).  Fall. EXAM: CT HEAD WITHOUT CONTRAST TECHNIQUE: Contiguous axial images were obtained from the base of the skull through the vertex without intravenous contrast. RADIATION DOSE REDUCTION: This exam was performed according to the departmental dose-optimization program which includes automated exposure control, adjustment of the mA and/or kV according to patient size and/or use of iterative reconstruction technique. COMPARISON:  12/22/2023 FINDINGS: Brain: Diffuse cerebral atrophy. No acute intracranial abnormality. Specifically, no hemorrhage, hydrocephalus, mass lesion, acute infarction, or significant intracranial injury. Vascular: No hyperdense vessel or unexpected calcification. Skull: No acute calvarial abnormality. Sinuses/Orbits: No acute findings Other: None IMPRESSION: Atrophy.  No acute intracranial abnormality. Electronically Signed   By: Janeece Mechanic M.D.   On: 02/08/2024 14:21   CT T-SPINE NO  CHARGE Result Date: 02/08/2024 CLINICAL DATA:  Fall, trauma EXAM: CT THORACIC SPINE WITHOUT CONTRAST  TECHNIQUE: Multidetector CT images of the thoracic were obtained using the standard protocol without intravenous contrast. RADIATION DOSE REDUCTION: This exam was performed according to the departmental dose-optimization program which includes automated exposure control, adjustment of the mA and/or kV according to patient size and/or use of iterative reconstruction technique. COMPARISON:  Thoracic MRI 09/17/2021 FINDINGS: Alignment: Normal Vertebrae: Postoperative changes from posterior fusion from T10-T12. There is collapse of the T10 vertebral body with abnormal lucency around the T10 screws, the T9-10 disc base and T10-11 disc space. Surrounding paraspinal soft tissue swelling. Appearance is concerning for discitis/osteomyelitis. Paraspinal and other soft tissues: Abnormal paraspinal soft tissue from T9-T11. Disc levels: Advanced degenerative changes in the lower thoracic spine and lower cervical spine. IMPRESSION: No acute traumatic findings. Prior posterior fusion from T10-T12. Abnormal appearance with collapse of the T10 vertebral body, lucency around the T10 screws, at the T9-10 disc space and T10-11 disc space concerning for discitis/osteomyelitis. Abnormal surrounding paraspinal soft tissue swelling. Electronically Signed   By: Janeece Mechanic M.D.   On: 02/08/2024 14:19   CT L-SPINE NO CHARGE Result Date: 02/08/2024 CLINICAL DATA:  Fall, trauma EXAM: CT LUMBAR SPINE WITHOUT CONTRAST TECHNIQUE: Multidetector CT imaging of the lumbar spine was performed without intravenous contrast administration. Multiplanar CT image reconstructions were also generated. RADIATION DOSE REDUCTION: This exam was performed according to the departmental dose-optimization program which includes automated exposure control, adjustment of the mA and/or kV according to patient size and/or use of iterative reconstruction technique. COMPARISON:  CT abdomen and pelvis 01/01/2024. FINDINGS: Segmentation: 5 lumbar type vertebrae.  Alignment: No subluxation Vertebrae: Severe diffuse osteopenia. Mild chronic compression fracture at L1 is stable since prior study. Postoperative changes from posterior fusion. Advanced degenerative disc and facet disease. Fusion of the facets diffusely. Paraspinal and other soft tissues: No paraspinal soft tissue abnormality in the lumbar spine. Disc levels: Severe diffuse degenerative disc disease and facet disease. Fusion across the facet joints diffusely. IMPRESSION: No acute bony abnormality. Mild chronic compression fracture at L1. Severe osteopenia. Posterior fusion changes throughout the lumbar spine and extending into the lower thoracic spine. Severe diffuse degenerative disc and facet disease with fusion across the facet joints. Electronically Signed   By: Janeece Mechanic M.D.   On: 02/08/2024 14:16   Medications:  ceFEPime  (MAXIPIME ) IV     [START ON 02/11/2024] ceFEPime  (MAXIPIME ) IV     [START ON 02/11/2024] vancomycin      vancomycin       amLODipine   10 mg Oral Daily   atorvastatin   80 mg Oral Daily   bethanechol   10 mg Oral BID   Chlorhexidine  Gluconate Cloth  6 each Topical Q0600   cloNIDine   0.1 mg Oral TID   hydrALAZINE   25 mg Oral TID   insulin  aspart  0-6 Units Subcutaneous TID WC   leptospermum manuka honey  1 Application Topical Daily   levothyroxine   50 mcg Oral QAC breakfast   lidocaine   1 patch Transdermal Once   losartan   50 mg Oral Daily   metoprolol  tartrate  50 mg Oral BID   senna-docusate  4 tablet Oral BID   sevelamer  carbonate  1,600 mg Oral TID WC   tamsulosin   0.4 mg Oral Daily    Dialysis Orders TTS -  4:15hr, 400/A1.5, EDW 92kg, 2K/2.5Ca, TDC, no heparin  - Mircera 225mcg IV q 2 weeks (last 4/9) - Calcitriol  0.41mcg PO q HD   Assessment/Plan:  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 CT imaging showed T9-10 discitis. He refused MRI, area too small for aspiration per IR. Just finished 8 week course of  Dapto/Cefepime  on 3/20 for bacteremia presumed due to foot infection. I do not have full records. Now started on Vanc/Cefepime  for 6 week course for discitis.   ESRD:  Usual TTS schedule - next HD tomorrow.  Hypertension/volume: BP ok, no edema. Appears euvolemic, low UF goal next HD d/t severe cramping with last.  Anemia: HGB 10.3 - not due for ESA yet.  Metabolic bone disease: Ca ok, Phos high - resumed home binders.  Nutrition: Alb 3.4, follow.  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   Janus Mercury, PA-C 02/10/2024, 11:14 AM  [employer]

## 2024-02-10 NOTE — [place] FL2 (Signed)
 [place]  MEDICAID FL2 LEVEL OF CARE FORM     IDENTIFICATION  Patient Name: Thomas [name] Birthdate: February 06, 1951 Sex: male Admission Date (Current Location): 02/07/2024  [facility] and [place] Number:  [employer] and Address:  [facility], [address] [place], [place] [address]      Provider Number: [id number]  Attending Physician Name and Address:  Burton Casey, MD  Relative Name and Phone Number:       Current Level of Care: Hospital Recommended Level of Care: Skilled Nursing Facility Prior Approval Number:    Date Approved/Denied:   PASRR Number: [id number]  Discharge Plan: SNF    Current Diagnoses: Patient Active Problem List   Diagnosis Date Noted   Acute low back pain 02/09/2024   Fall 02/09/2024   End stage renal disease on dialysis [facility]) 02/09/2024   History of anemia due to chronic kidney disease 02/09/2024   Acute midline thoracic back pain 02/09/2024   Osteomyelitis of thoracic spine (HCC) 02/08/2024   Gabapentin -induced toxicity 01/14/2022   Open wound of left heel 01/14/2022   Acute renal failure superimposed on stage 4 chronic kidney disease (HCC) 01/13/2022   Chest pain 01/13/2022   UTI (urinary tract infection) 12/31/2021   Chronic anticoagulation 12/31/2021   Prolonged QT interval 12/31/2021   Multifocal pneumonia 12/30/2021   SVT (supraventricular tachycardia) (HCC) 12/11/2021   Obesity, Class I, BMI 30-34.9 12/10/2021   Left leg cellulitis 12/07/2021   Hyperkalemia 10/09/2021   Acquired hypothyroidism 10/09/2021   Hydronephrosis of right kidney 10/09/2021   Hypoglycemia 10/07/2021   Deep vein thrombosis (DVT) of left lower extremity (HCC) 10/07/2021   Acute renal failure superimposed on chronic kidney disease (HCC) 09/17/2021   CAP (community acquired pneumonia) 09/17/2021   Cellulitis of left lower extremity    Pressure injury of skin 06/03/2021   AKI (acute kidney injury) (HCC) 06/02/2021    Pain management    Cord compression (HCC)    Drug-induced constipation    Acute midline low back pain with sciatica    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) 08/30/2020   Urinary retention 12/04/2019   BPH (benign prostatic hyperplasia) 11/24/2019   CKD (chronic kidney disease), stage IV (HCC) 11/24/2019   Chronic pain syndrome 11/24/2019   Physical deconditioning 11/24/2019   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   Pulmonary emboli (HCC) 09/29/2018   [name], 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/2018   DM2 (diabetes mellitus, type 2) (HCC) 09/29/2018   Gout 09/29/2018   Essential hypertension 09/29/2018   Chronic combined systolic and diastolic congestive heart failure (HCC) 09/29/2018   GERD (gastroesophageal reflux disease) 09/29/2018    Orientation RESPIRATION BLADDER Height & Weight     Self, Time, Situation, Place  Normal Continent Weight: 207 lb 0.2 oz (93.9 kg) Height:  6\' 5"  (195.6 cm)  BEHAVIORAL SYMPTOMS/MOOD NEUROLOGICAL BOWEL NUTRITION STATUS      Continent Diet (See dc summary)  AMBULATORY STATUS COMMUNICATION OF NEEDS Skin   Extensive Assist Verbally PU Stage and Appropriate Care (unstageable on toe)                       Personal Care Assistance Level of Assistance  Bathing, Feeding, Dressing Bathing Assistance: Maximum assistance Feeding assistance: Independent Dressing Assistance: Limited assistance     Functional Limitations Info             SPECIAL CARE FACTORS FREQUENCY  PT (By licensed PT), OT (By licensed  OT)     PT Frequency: 5x/week OT Frequency: 5x/week            Contractures Contractures Info: Not present    Additional Factors Info  Code Status, Allergies Code Status Info: Full Allergies Info: Other, Oxymorphone, Amitriptyline, Aspirin, Beta Vulgaris, Buspirone, Cabbage, Codeine, Cyclobenzaprine, Diflunisal, Fish Allergy, Fish-derived Products, Hydroxyzine, Levetiracetam, Methadone, Nalbuphine, Pentazocine, Propoxyphene,  Shellfish Allergy, Sulfa Antibiotics, Sulfasalazine, Amoxicillin            Current Medications (02/10/2024):  This is the current hospital active medication list Current Facility-Administered Medications  Medication Dose Route Frequency Provider Last Rate Last Admin   acetaminophen  (TYLENOL ) tablet 650 mg  650 mg Oral Q6H PRN Howerter, Justin B, DO       Or   acetaminophen  (TYLENOL ) suppository 650 mg  650 mg Rectal Q6H PRN Howerter, Justin B, DO       amLODipine  (NORVASC ) tablet 10 mg  10 mg Oral Daily Gonfa, Taye T, MD   10 mg at 02/10/24 0954   atorvastatin  (LIPITOR ) tablet 80 mg  80 mg Oral Daily Howerter, Justin B, DO   80 mg at 02/10/24 1610   bethanechol  (URECHOLINE ) tablet 10 mg  10 mg Oral BID Gonfa, Taye T, MD   10 mg at 02/10/24 1002   [START ON 02/11/2024] ceFEPIme  (MAXIPIME ) 2 g in sodium chloride  0.9 % 100 mL IVPB  2 g Intravenous Q T,Th,Sa-HD Pham, Minh Q, RPH-CPP       Chlorhexidine  Gluconate Cloth 2 % PADS 6 each  6 each Topical Q0600 Stovall, Kathryn R, PA-C   6 each at 02/10/24 9604   cloNIDine  (CATAPRES ) tablet 0.1 mg  0.1 mg Oral TID Howerter, Justin B, DO   0.1 mg at 02/10/24 1511   heparin  injection 5,000 Units  5,000 Units Subcutaneous Q8H Ghimire, Estil Heman, MD   5,000 Units at 02/10/24 1359   hydrALAZINE  (APRESOLINE ) tablet 25 mg  25 mg Oral TID Howerter, Justin B, DO   25 mg at 02/10/24 1511   HYDROmorphone  (DILAUDID ) injection 1 mg  1 mg Intravenous Q2H PRN Howerter, Justin B, DO   1 mg at 02/10/24 1200   leptospermum manuka honey (MEDIHONEY) paste 1 Application  1 Application Topical Daily Gonfa, Taye T, MD   1 Application at 02/10/24 1002   levothyroxine  (SYNTHROID ) tablet 50 mcg  50 mcg Oral QAC breakfast Howerter, Justin B, DO   50 mcg at 02/10/24 5409   lidocaine  (LIDODERM ) 5 % 1 patch  1 patch Transdermal Once Russella Courts A, DO       losartan  (COZAAR ) tablet 50 mg  50 mg Oral Daily Gonfa, Taye T, MD   50 mg at 02/10/24 0954   melatonin tablet 3 mg  3 mg Oral  QHS PRN Howerter, Justin B, DO       metoprolol  tartrate (LOPRESSOR ) tablet 50 mg  50 mg Oral BID Gonfa, Taye T, MD   50 mg at 02/10/24 8119   naloxone  (NARCAN ) injection 0.4 mg  0.4 mg Intravenous PRN Howerter, Justin B, DO       ondansetron  (ZOFRAN ) injection 4 mg  4 mg Intravenous Q6H PRN Howerter, Justin B, DO       oxyCODONE  (Oxy IR/ROXICODONE ) immediate release tablet 5 mg  5 mg Oral Q6H PRN Burton Casey, MD   5 mg at 02/10/24 1511   polyethylene glycol (MIRALAX  / GLYCOLAX ) packet 17 g  17 g Oral Daily Burton Casey, MD   17 g at 02/10/24  1240   senna-docusate (Senokot-S) tablet 4 tablet  4 tablet Oral BID Howerter, Justin B, DO   4 tablet at 02/10/24 4098   sevelamer  carbonate (RENVELA ) tablet 1,600 mg  1,600 mg Oral TID WC Howerter, Justin B, DO   1,600 mg at 02/10/24 1240   tamsulosin  (FLOMAX ) capsule 0.4 mg  0.4 mg Oral Daily Gonfa, Taye T, MD   0.4 mg at 02/10/24 1191   tiZANidine  (ZANAFLEX ) tablet 4 mg  4 mg Oral Q8H PRN Howerter, Justin B, DO       [START ON 02/11/2024] vancomycin  (VANCOCIN ) IVPB 1000 mg/200 mL premix  1,000 mg Intravenous Q T,Th,Sa-HD Pham, Minh Q, RPH-CPP         Discharge Medications: Please see discharge summary for a list of discharge medications.  Relevant Imaging Results:  Relevant Lab Results:   Additional Information SSN: [id number]. Patient has right BKA and a prosthesis. Patient goes to [facility], Arrival time: 9:55 AM, Chair Time: 10:00 AM  Jannice Mends, LCSW

## 2024-02-10 NOTE — Progress Notes (Signed)
 PHARMACY CONSULT NOTE FOR:  OUTPATIENT  PARENTERAL ANTIBIOTIC THERAPY (OPAT)  Informational as the patient will receive antibiotics with hemodialysis outpatient  Indication: Thoracic discitis Regimen: Vancomycin  1g/HD-TTS + Cefepime  2g/HD-TTS End date: 03/23/24  IV antibiotic discharge orders are pended. To discharging provider:  please sign these orders via discharge navigator,  Select New Orders & click on the button choice - Manage This Unsigned Work.    Thank you for allowing pharmacy to be a part of this patient's care.  Garland Junk, PharmD, BCPS, BCIDP Infectious Diseases Clinical Pharmacist 02/11/2024 7:43 AM   **Pharmacist phone directory can now be found on [email or web address] ([email or web address]).  Listed under [facility].

## 2024-02-10 NOTE — Progress Notes (Signed)
 [facility]    Date of Admission:  02/07/2024   Total days of antibiotics 1   ID: Thomas [name] is a 73 y.o. male with  thoracic discitis/ HW loosening Principal Problem:   Osteomyelitis of thoracic spine (HCC) Active Problems:   DM2 (diabetes mellitus, type 2) (HCC)   Essential hypertension   Acquired hypothyroidism   Acute low back pain   Fall   End stage renal disease on dialysis (HCC)   History of anemia due to chronic kidney disease   Acute midline thoracic back pain    Subjective: Afebrile, but still significant back pain  Medications:   amLODipine   10 mg Oral Daily   atorvastatin   80 mg Oral Daily   bethanechol   10 mg Oral BID   Chlorhexidine  Gluconate Cloth  6 each Topical Q0600   cloNIDine   0.1 mg Oral TID   heparin  injection (subcutaneous)  5,000 Units Subcutaneous Q8H   hydrALAZINE   25 mg Oral TID   leptospermum manuka honey  1 Application Topical Daily   levothyroxine   50 mcg Oral QAC breakfast   lidocaine   1 patch Transdermal Once   losartan   50 mg Oral Daily   metoprolol  tartrate  50 mg Oral BID   polyethylene glycol  17 g Oral Daily   senna-docusate  4 tablet Oral BID   sevelamer  carbonate  1,600 mg Oral TID WC   tamsulosin   0.4 mg Oral Daily    Objective: Vital signs in last 24 hours: Temp:  [97.9 F (36.6 C)-98.3 F (36.8 C)] 98.3 F (36.8 C) (04/23 1200) Pulse Rate:  [66-73] 73 (04/23 1200) Resp:  [13-18] 13 (04/23 1200) BP: (106-145)/(59-93) 127/90 (04/23 1200) SpO2:  [98 %-100 %] 98 % (04/23 1200) Weight:  [93.9 kg] 93.9 kg (04/23 0410)  Physical Exam  Constitutional: He is oriented to person, place, and time. He appears well-developed and well-nourished. No distress.  HENT:  Mouth/Throat: Oropharynx is clear and moist. No oropharyngeal exudate.  Cardiovascular: Normal rate, regular rhythm and normal heart sounds. Exam reveals no gallop and no friction rub.  No murmur heard.  Pulmonary/Chest: Effort normal  and breath sounds normal. No respiratory distress. He has no wheezes.  Abdominal: Soft. Bowel sounds are normal. He exhibits no distension. There is no tenderness.  Lymphadenopathy:  He has no cervical adenopathy.  Neurological: He is alert and oriented to person, place, and time.  Skin: Skin is warm and dry. No rash noted. No erythema.  Psychiatric: He has a normal mood and affect. His behavior is normal.    Lab Results Recent Labs    02/08/24 0639 02/08/24 0643 02/09/24 0406 02/09/24 0414  WBC 5.3  --   --  5.6  HGB 10.0* 9.9*  --  10.3*  HCT 32.1* 29.0*  --  32.5*  NA 139 138 138  --   K 4.8 4.8 5.7*  --   CL 99 103 101  --   CO2 24  --  21*  --   BUN 41* 39* 47*  --   CREATININE 7.79* 8.40* 8.57*  --    Liver Panel Recent Labs    02/08/24 0639 02/09/24 0406  PROT 8.6* 8.2*  ALBUMIN  3.5 3.4*  AST 22 20  ALT 9 8  ALKPHOS 45 50  BILITOT 0.7 0.9   Sedimentation Rate Recent Labs    02/09/24 0414  ESRSEDRATE 112*   C-Reactive Protein Recent Labs    02/09/24 0406  CRP  1.4*    Microbiology: ------------------ Studies/Results: DG Foot 2 Views Left Result Date: 02/09/2024 CLINICAL DATA:  Skin ulcer of the left great toe EXAM: LEFT FOOT - 2 VIEW COMPARISON:  11/21/2023 FINDINGS: Chronic erosive changes at the first, second, and fourth MTP joints. Additional chronic erosive changes about the 2nd-5th proximal phalanges. Advanced midfoot degenerative arthritis. Pes planus. Soft tissue swelling about the forefoot and great toe. IMPRESSION: Chronic erosive changes about the forefoot are similar to 11/21/2023. Osteomyelitis/septic arthritis of the first and second MTP joints is difficult to exclude. Consider MRI for further evaluation. Electronically Signed   By: Rozell Cornet M.D.   On: 02/09/2024 20:26     Assessment/Plan: Thoracic osteomyelitis  = plan to treat as acute thoracic OM, back pain likely from collapse of T10 vertgral body and paraspinal inflammation  T9-T10, with lucency about T10 screws noted on CT.  Treat with empiric course with vanco and cefepimex 6 wk with HD. Will place opat note. He has right chest wall hd catheter -- since no bacteremia, no need to change line  Back pain - defer to primary team for management. Not sure if he needs back brace that could help relieve pressure from T10 collapse. Would check if NSGY thinks fusion should be extended above T10? - he may be too frail for further NSGY to weigh risk of surgery vs. Pain management  We will see back in ID clinic in 5-6 wk  Will sign off.    [facility] for Infectious Diseases Pager: [phone]  02/10/2024, 4:43 PM

## 2024-02-10 NOTE — Progress Notes (Signed)
 Overall stable.  Still complains of neck and mid thoracic pain.  No radiating pain numbness or new weakness.  CT scan cervical spine demonstrates extreme lower cervical disc degeneration and changes of renal osteodystrophy with disc base collapse and kyphotic angulation.  No evidence of high-grade stenosis.  No evidence of new fracture or dislocation.  CT scan of thoracic and lumbar spine demonstrates postoperative change from T10 through his sacrum.  Instrumentation intact.  Fusion appears solid.  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  No obvious epidural abscess.  7 Tatian rate very elevated at 120.  CRP also elevated.  Presentation consistent with osteomyelitis discitis at T9-10.  Interventional radiology does not feel this disc base can be aspirated.  With that in mind I would presumptively treat for staff aureus osteomyelitis for at least a 6-week course.  Given overall medical condition and severe vascular disease with foot ulceration he has not someone who would tolerate extensive thoracic reconstructive surgery.  Recommend mobilization with therapy.  Recommend continue efforts at pain control.  Should use TLSO when out of bed.

## 2024-02-11 DIAGNOSIS — I10 Essential (primary) hypertension: Secondary | ICD-10-CM | POA: Diagnosis not present

## 2024-02-11 DIAGNOSIS — E119 Type 2 diabetes mellitus without complications: Secondary | ICD-10-CM | POA: Diagnosis not present

## 2024-02-11 DIAGNOSIS — M4624 Osteomyelitis of vertebra, thoracic region: Secondary | ICD-10-CM | POA: Diagnosis not present

## 2024-02-11 DIAGNOSIS — E039 Hypothyroidism, unspecified: Secondary | ICD-10-CM | POA: Diagnosis not present

## 2024-02-11 LAB — GLUCOSE, CAPILLARY
Glucose-Capillary: 99 mg/dL (ref 70–99)
Glucose-Capillary: 116 mg/dL — ABNORMAL HIGH (ref 70–99)
Glucose-Capillary: 120 mg/dL — ABNORMAL HIGH (ref 70–99)

## 2024-02-11 LAB — RENAL FUNCTION PANEL
Albumin: 3 g/dL — ABNORMAL LOW (ref 3.5–5.0)
Anion gap: 16 — ABNORMAL HIGH (ref 5–15)
BUN: 47 mg/dL — ABNORMAL HIGH (ref 8–23)
CO2: 21 mmol/L — ABNORMAL LOW (ref 22–32)
Calcium: 7.7 mg/dL — ABNORMAL LOW (ref 8.9–10.3)
Chloride: 99 mmol/L (ref 98–111)
Creatinine, Ser: 7.62 mg/dL — ABNORMAL HIGH (ref 0.61–1.24)
GFR, Estimated: 7 mL/min — ABNORMAL LOW (ref >60)
Glucose, Bld: 94 mg/dL (ref 70–99)
Phosphorus: 6.8 mg/dL — ABNORMAL HIGH (ref 2.5–4.6)
Potassium: 5.2 mmol/L — ABNORMAL HIGH (ref 3.5–5.1)
Sodium: 136 mmol/L (ref 135–145)

## 2024-02-11 LAB — CBC
HCT: 29 % — ABNORMAL LOW (ref 39.0–52.0)
Hemoglobin: 9 g/dL — ABNORMAL LOW (ref 13.0–17.0)
MCH: 33.5 pg (ref 26.0–34.0)
MCHC: 31 g/dL (ref 30.0–36.0)
MCV: 107.8 fL — ABNORMAL HIGH (ref 80.0–100.0)
Platelets: 147 10*3/uL — ABNORMAL LOW (ref 150–400)
RBC: 2.69 MIL/uL — ABNORMAL LOW (ref 4.22–5.81)
RDW: 14.9 % (ref 11.5–15.5)
WBC: 5.5 10*3/uL (ref 4.0–10.5)
nRBC: 0 % (ref 0.0–0.2)

## 2024-02-11 MED ORDER — VANCOMYCIN IV (FOR PTA / DISCHARGE USE ONLY)
1000.0000 mg | INTRAVENOUS | Status: DC
Start: 2024-02-11 — End: 2024-02-22

## 2024-02-11 MED ORDER — HEPARIN SODIUM (PORCINE) 1000 UNIT/ML IJ SOLN
INTRAMUSCULAR | Status: AC
  Filled 2024-02-11: qty 3

## 2024-02-11 MED ORDER — VANCOMYCIN HCL IN DEXTROSE 1-5 GM/200ML-% IV SOLN
INTRAVENOUS | Status: AC
  Filled 2024-02-11: qty 200

## 2024-02-11 MED ORDER — HYDROMORPHONE HCL 1 MG/ML IJ SOLN
INTRAMUSCULAR | Status: AC
  Filled 2024-02-11: qty 1

## 2024-02-11 MED ORDER — CEFEPIME IV (FOR PTA / DISCHARGE USE ONLY): 2.0000 g | INTRAVENOUS | Status: DC

## 2024-02-11 NOTE — Procedures (Signed)
 Patient was seen on dialysis and the procedure was supervised.  BFR 400  Via TDC BP is  143/82.   Patient appears to be tolerating treatment well.  Thomas [name] 02/11/2024

## 2024-02-11 NOTE — Plan of Care (Signed)

## 2024-02-11 NOTE — Progress Notes (Signed)
 PROGRESS NOTE        PATIENT DETAILS Name: Thomas [name] Age: 73 y.o. Sex: male Date of Birth: 07/12/1951 Admit Date: 02/07/2024 Admitting Physician Roxana Copier, DO MVH:QIONGE, [name], MD  Brief Summary: Patient is a 73 y.o.  male who with history of ESRD on HD TTS, history of posterior fusion at T10-12 due to osteomyelitis/discitis/epidural abscess in 2021, history of right BKA who presented with low back pain-upon further evaluation with CT imaging-he was found to have potential acute osteomyelitis involving his thoracic spine and admitted to the hospitalist service.  Significant events: 4/20>> admit to [facility]-back pain-concern for thoracic osteomyelitis.  Significant studies: 4/21>> x-ray right elbow: No fracture. 4/21>> x-ray left wrist: No fracture 4/21>> x-ray left shoulder, no fracture. 4/21>> CT head: No acute intracranial abnormality 4/21>> CT C-spine: No fracture. 4/21>> CT abdomen/pelvis: No acute injury to chest/abdomen/pelvis 4/21>> CT T-spine: Prior posterior fusion from T10-T12-abnormal appearance with collapse of T10-lucency around T10 screws at the T9-10 disc space, T10-11 disc space concerning for discitis/osteomyelitis. 4/21>> CT L-spine: No acute bony abnormality, mild chronic compression fracture at L1. 4/22>> x-ray left foot: Chronic erosive changes about the forefoot similar to 11/21/2023-osteomyelitis/septic arthritis of the 1st/2nd MTP joints difficult to exclude.  Significant microbiology data: 4/22>> blood culture: No growth  Procedures: None  Consults: Neurosurgery Infectious disease  Subjective: Some back pain.  Appears comfortable.  Tells me that he completed 6 weeks of IV antibiotics with dialysis just a few weeks ago-and that it was given for his "foot infection"-and that he had refused an amputation.  This was apparently through [facility].  Objective: Vitals: Blood pressure (!) 143/82, pulse (!) 58,  temperature 98.4 F (36.9 C), temperature source Oral, resp. rate 16, height 6\' 5"  (1.956 m), weight 92.1 kg, SpO2 97%.   Exam: Gen Exam:Alert awake-not in any distress HEENT:atraumatic, normocephalic Chest: B/L clear to auscultation anteriorly CVS:S1S2 regular Abdomen:soft non tender, non distended Extremities: Right BKA. Neurology: Non focal Skin: no rash  Pertinent Labs/Radiology:    Latest Ref Rng & Units 02/11/2024    4:24 AM 02/09/2024    4:14 AM 02/08/2024    6:43 AM  CBC  WBC 4.0 - 10.5 K/uL 5.5  5.6    Hemoglobin 13.0 - 17.0 g/dL 9.0  95.2  9.9   Hematocrit 39.0 - 52.0 % 29.0  32.5  29.0   Platelets 150 - 400 K/uL 147  169      Lab Results  Component Value Date   NA 136 02/11/2024   K 5.2 (H) 02/11/2024   CL 99 02/11/2024   CO2 21 (L) 02/11/2024      Assessment/Plan: Acute osteomyelitis with discitis involving T9 to T11 vertebra No issues overnight-some back pain but stable exam Per neurosurgery not an operative candidate Per IR-no window to aspirate He tells me today that he completed approximately 6 weeks of IV antibiotics with hemodialysis just several weeks ago for apparently a foot infection-he had apparently refused amputation of his toe at [facility] and was placed on IV antibiotics.   Discussed with ID and nephrology team-we will try to get some records from [facility]-but the concern is that he may have seeded his spine from his foot.  In the interim-continue with IV antibiotics-vancomycin /cefepime . Blood cultures negative so far.  Note-refuses MRI-per patient-he had a syncopal event with prior MRI-and his  cardiologist does not want him to undergo any further MR studies.  Mechanical fall Numerous imaging studies negative for fracture PT/OT eval.  ESRD on HD TTS Nephrology following and directing HD care  Hyperkalemia Mild Getting dialysis this morning. Follow electrolytes.  Normocytic anemia Secondary to ESRD Aranesp /iron  deferred  to nephrology service.  Chronic HFpEF Euvolemic on exam Fluid removal with HD  HTN BP stable Clonidine /amlodipine /losartan   HLD Statin  Hypothyroidism Synthroid   BPH Flomax /bethanechol   Chronic left second toe ulceration X-ray with stable changes-no active infection noted See above discussion-trying to obtain records from [facility]-but suspect that he may have osteomyelitis   History of right BKA  BMI: Estimated body mass index is 24.08 kg/m as calculated from the following:   Height as of this encounter: 6\' 5"  (1.956 m).   Weight as of this encounter: 92.1 kg.   Code status:   Code Status: Full Code   DVT Prophylaxis: heparin  injection 5,000 Units Start: 02/10/24 1400 SCDs Start: 02/08/24 2206   Family Communication: None at bedside   Disposition Plan: Status is: Inpatient Remains inpatient appropriate because: Severity of illness   Planned Discharge Destination:Home health   Diet: Diet Order             Diet regular Room service appropriate? Yes; Fluid consistency: Thin  Diet effective now                     Antimicrobial agents: Anti-infectives (From admission, onward)    Start     Dose/Rate Route Frequency Ordered Stop   02/11/24 1800  ceFEPIme  (MAXIPIME ) 2 g in sodium chloride  0.9 % 100 mL IVPB        2 g 200 mL/hr over 30 Minutes Intravenous Every T-Th-Sa (Hemodialysis) 02/10/24 1028     02/11/24 1200  vancomycin  (VANCOCIN ) IVPB 1000 mg/200 mL premix        1,000 mg 200 mL/hr over 60 Minutes Intravenous Every T-Th-Sa (Hemodialysis) 02/10/24 1028     02/11/24 0000  ceFEPime  (MAXIPIME ) IVPB        2 g Intravenous Every T-Th-Sa (Hemodialysis) 02/11/24 0747 03/23/24 2359   02/11/24 0000  vancomycin  IVPB        1,000 mg Intravenous Every T-Th-Sa (Hemodialysis) 02/11/24 0747 03/23/24 2359   02/10/24 1115  vancomycin  (VANCOREADY) IVPB 2000 mg/400 mL        2,000 mg 200 mL/hr over 120 Minutes Intravenous  Once 02/10/24 1028 02/10/24  1446   02/10/24 1115  ceFEPIme  (MAXIPIME ) 2 g in sodium chloride  0.9 % 100 mL IVPB        2 g 200 mL/hr over 30 Minutes Intravenous  Once 02/10/24 1028 02/10/24 1239        MEDICATIONS: Scheduled Meds:  amLODipine   10 mg Oral Daily   atorvastatin   80 mg Oral Daily   bethanechol   10 mg Oral BID   Chlorhexidine  Gluconate Cloth  6 each Topical Q0600   cloNIDine   0.1 mg Oral TID   heparin  injection (subcutaneous)  5,000 Units Subcutaneous Q8H   hydrALAZINE   25 mg Oral TID   leptospermum manuka honey  1 Application Topical Daily   levothyroxine   50 mcg Oral QAC breakfast   lidocaine   1 patch Transdermal Once   losartan   50 mg Oral Daily   metoprolol  tartrate  50 mg Oral BID   polyethylene glycol  17 g Oral Daily   senna-docusate  4 tablet Oral BID   sevelamer  carbonate  1,600 mg Oral TID WC  tamsulosin   0.4 mg Oral Daily   Continuous Infusions:  ceFEPime  (MAXIPIME ) IV     vancomycin      PRN Meds:.acetaminophen  **OR** acetaminophen , HYDROmorphone  (DILAUDID ) injection, melatonin, naLOXone  (NARCAN )  injection, ondansetron  (ZOFRAN ) IV, oxyCODONE , tiZANidine    I have personally reviewed following labs and imaging studies  LABORATORY DATA: CBC: Recent Labs  Lab 02/08/24 0639 02/08/24 0643 02/09/24 0414 02/11/24 0424  WBC 5.3  --  5.6 5.5  NEUTROABS  --   --  3.8  --   HGB 10.0* 9.9* 10.3* 9.0*  HCT 32.1* 29.0* 32.5* 29.0*  MCV 109.6*  --  108.0* 107.8*  PLT 151  --  169 147*    Basic Metabolic Panel: Recent Labs  Lab 02/08/24 0639 02/08/24 0643 02/09/24 0406 02/11/24 0424  NA 139 138 138 136  K 4.8 4.8 5.7* 5.2*  CL 99 103 101 99  CO2 24  --  21* 21*  GLUCOSE 80 76 58* 94  BUN 41* 39* 47* 47*  CREATININE 7.79* 8.40* 8.57* 7.62*  CALCIUM  8.1*  --  8.0* 7.7*  MG  --   --  2.4  --   PHOS  --   --  8.2* 6.8*    GFR: Estimated Creatinine Clearance: 11.4 mL/min (A) (by C-G formula based on SCr of 7.62 mg/dL (H)).  Liver Function Tests: Recent Labs  Lab  02/08/24 0639 02/09/24 0406 02/11/24 0424  AST 22 20  --   ALT 9 8  --   ALKPHOS 45 50  --   BILITOT 0.7 0.9  --   PROT 8.6* 8.2*  --   ALBUMIN  3.5 3.4* 3.0*   No results for input(s): "LIPASE", "AMYLASE" in the last 168 hours. No results for input(s): "AMMONIA" in the last 168 hours.  Coagulation Profile: Recent Labs  Lab 02/08/24 0639 02/09/24 1931  INR 1.1 1.1    Cardiac Enzymes: No results for input(s): "CKTOTAL", "CKMB", "CKMBINDEX", "TROPONINI" in the last 168 hours.  BNP (last 3 results) No results for input(s): "PROBNP" in the last 8760 hours.  Lipid Profile: No results for input(s): "CHOL", "HDL", "LDLCALC", "TRIG", "CHOLHDL", "LDLDIRECT" in the last 72 hours.  Thyroid Function Tests: No results for input(s): "TSH", "T4TOTAL", "FREET4", "T3FREE", "THYROIDAB" in the last 72 hours.  Anemia Panel: Recent Labs    02/09/24 0406  VITAMINB12 1,297*  FOLATE 15.9    Urine analysis:    Component Value Date/Time   COLORURINE YELLOW 01/14/2022 0248   APPEARANCEUR HAZY (A) 01/14/2022 0248   LABSPEC 1.009 01/14/2022 0248   PHURINE 7.0 01/14/2022 0248   GLUCOSEU NEGATIVE 01/14/2022 0248   HGBUR MODERATE (A) 01/14/2022 0248   BILIRUBINUR NEGATIVE 01/14/2022 0248   KETONESUR NEGATIVE 01/14/2022 0248   PROTEINUR 100 (A) 01/14/2022 0248   NITRITE NEGATIVE 01/14/2022 0248   LEUKOCYTESUR LARGE (A) 01/14/2022 0248    Sepsis Labs: Lactic Acid, Venous    Component Value Date/Time   LATICACIDVEN 1.0 12/31/2021 0048    MICROBIOLOGY: Recent Results (from the past 240 hours)  Culture, blood (Routine X 2) w Reflex to ID Panel     Status: None (Preliminary result)   Collection Time: 02/09/24  4:06 AM   Specimen: BLOOD  Result Value Ref Range Status   Specimen Description BLOOD BLOOD RIGHT HAND  Final   Special Requests   Final    BOTTLES DRAWN AEROBIC ONLY Blood Culture adequate volume   Culture   Final    NO GROWTH 1 DAY Performed at [facility]  Lab,  [address] [place], [place] [address]    Report Status PENDING  Incomplete  Culture, blood (Routine X 2) w Reflex to ID Panel     Status: None (Preliminary result)   Collection Time: 02/09/24  4:14 AM   Specimen: BLOOD  Result Value Ref Range Status   Specimen Description BLOOD BLOOD RIGHT HAND AEROBIC BOTTLE ONLY  Final   Special Requests   Final    BOTTLES DRAWN AEROBIC ONLY Blood Culture adequate volume   Culture   Final    NO GROWTH 1 DAY Performed at [facility], [address], [place], [place] [address]    Report Status PENDING  Incomplete    RADIOLOGY STUDIES/RESULTS: DG Foot 2 Views Left Result Date: 02/09/2024 CLINICAL DATA:  Skin ulcer of the left great toe EXAM: LEFT FOOT - 2 VIEW COMPARISON:  11/21/2023 FINDINGS: Chronic erosive changes at the first, second, and fourth MTP joints. Additional chronic erosive changes about the 2nd-5th proximal phalanges. Advanced midfoot degenerative arthritis. Pes planus. Soft tissue swelling about the forefoot and great toe. IMPRESSION: Chronic erosive changes about the forefoot are similar to 11/21/2023. Osteomyelitis/septic arthritis of the first and second MTP joints is difficult to exclude. Consider MRI for further evaluation. Electronically Signed   By: Rozell Cornet M.D.   On: 02/09/2024 20:26     LOS: 3 days   Kimberly Penna, MD  [facility]    To contact the attending provider between 7A-7P or the covering provider during after hours 7P-7A, please log into the web site [email or web address] and access using universal [place] password for that web site. If you do not have the password, please call the hospital operator.  02/11/2024, 8:40 AM

## 2024-02-11 NOTE — Progress Notes (Signed)
   02/11/24 1315  Vitals  Temp 97.8 F (36.6 C)  Pulse Rate (!) 59  Resp 15  BP 126/86  SpO2 100 %  O2 Device Room Air  Oxygen Therapy  Patient Activity (if Appropriate) In bed  Pulse Oximetry Type Continuous  Oximetry Probe Site Changed No  Post Treatment  Dialyzer Clearance Lightly streaked  Hemodialysis Intake (mL) 0 mL  Liters Processed 62.7  Fluid Removed (mL) 1000 mL  Tolerated HD Treatment Yes  Post-Hemodialysis Comments Pt unable to run at prescribed rate due to positional right chest HD catheter. Pt received 1 gram of vancomycin  during last hour of dialysis. Pt received 1mg  hydromorphone  for pain control.   Received patient in bed to unit.  Alert and oriented.  Informed consent signed and in chart.   TX duration: 3 hours and 30 minutes  Patient tolerated well.  Transported back to the room  Alert, without acute distress.  Hand-off given to patient's nurse.   Access used: Right chest HD catheter Access issues: Positional catheter requiring dialysis cartridge change during treatment

## 2024-02-11 NOTE — TOC Progression Note (Signed)
 Transition of Care [facility]) - Progression Note    Patient Details  Name: Thomas [name] MRN: [id number] Date of Birth: July 17, 1951  [facility] of [facility] CM/SW Contact  Jannice Mends, LCSW Phone Number: 02/11/2024, 5:03 PM  Clinical Narrative:    No SNF bed offers at this time. CSW expanded search.   Expected Discharge Plan: Skilled Nursing Facility Barriers to Discharge: Continued Medical Work up, English as a second language teacher, SNF Pending bed offer  Expected Discharge Plan and Services     Post Acute Care Choice: Skilled Nursing Facility Living arrangements for the past 2 months: Single Family Home                                       Social Determinants of Health (SDOH) Interventions SDOH Screenings   Food Insecurity: No Food Insecurity (02/09/2024)  Housing: Low Risk  (02/09/2024)  Transportation Needs: No Transportation Needs (02/09/2024)  Utilities: Not At Risk (02/09/2024)  Depression (PHQ2-9): Low Risk  (02/08/2020)  Social Connections: Socially Isolated (02/11/2024)  Tobacco Use: Low Risk  (02/07/2024)    Readmission Risk Interventions    01/15/2022    2:56 PM 01/02/2022    2:03 PM 12/31/2021   10:32 AM  Readmission Risk Prevention Plan  Transportation Screening Complete Complete Complete  Medication Review Oceanographer) Complete Complete Complete  PCP or Specialist appointment within 3-5 days of discharge Complete Complete Complete  HRI or Home Care Consult Complete Complete Complete  SW Recovery Care/Counseling Consult Complete Complete Complete  Palliative Care Screening Not Applicable Not Applicable Not Applicable  Skilled Nursing Facility Not Applicable Not Applicable Not Applicable

## 2024-02-11 NOTE — Progress Notes (Addendum)
 [facility]    Date of Admission:  02/07/2024   Total days of antibiotics 2          ID: Thomas [name] is a 73 y.o. male with  thoracic discitis/OM with likely nidus for DFU/OM of left foot Principal Problem:   Osteomyelitis of thoracic spine (HCC) Active Problems:   DM2 (diabetes mellitus, type 2) (HCC)   Essential hypertension   Acquired hypothyroidism   Acute low back pain   Fall   End stage renal disease on dialysis (HCC)   History of anemia due to chronic kidney disease   Acute midline thoracic back pain    Subjective: Afebrile, tolerating HD, but has back pain ongoing.  Reviewed [place] hospital admission from feb and march - appear to be treating right foot osteomyelitis but only had superficial swab cx showing MRSA and acinetobacter. Patient was encouraged to get TMA but at that time declined.  Medications:   amLODipine   10 mg Oral Daily   atorvastatin   80 mg Oral Daily   bethanechol   10 mg Oral BID   Chlorhexidine  Gluconate Cloth  6 each Topical Q0600   cloNIDine   0.1 mg Oral TID   heparin  injection (subcutaneous)  5,000 Units Subcutaneous Q8H   hydrALAZINE   25 mg Oral TID   leptospermum manuka honey  1 Application Topical Daily   levothyroxine   50 mcg Oral QAC breakfast   lidocaine   1 patch Transdermal Once   losartan   50 mg Oral Daily   metoprolol  tartrate  50 mg Oral BID   polyethylene glycol  17 g Oral Daily   senna-docusate  4 tablet Oral BID   sevelamer  carbonate  1,600 mg Oral TID WC   tamsulosin   0.4 mg Oral Daily    Objective: Vital signs in last 24 hours: Temp:  [97.8 F (36.6 C)-98.5 F (36.9 C)] 97.8 F (36.6 C) (04/24 1315) Pulse Rate:  [54-86] 59 (04/24 1315) Resp:  [9-18] 15 (04/24 1315) BP: (105-152)/(69-92) 126/86 (04/24 1315) SpO2:  [96 %-100 %] 100 % (04/24 1315) Weight:  [92.1 kg-93.7 kg] 93.7 kg (04/24 0818)  Physical Exam  Constitutional: He is oriented to person, place, and time. He appears  well-developed and well-nourished. No distress.  HENT:  Mouth/Throat: Oropharynx is clear and moist. No oropharyngeal exudate.  Cardiovascular: Normal rate, regular rhythm and normal heart sounds. Exam reveals no gallop and no friction rub.  No murmur heard.  Pulmonary/Chest: Effort normal and breath sounds normal. No respiratory distress. He has no wheezes.  Abdominal: Soft. Bowel sounds are normal. He exhibits no distension. There is no tenderness.  Lymphadenopathy:  He has no cervical adenopathy.  Neurological: He is alert and oriented to person, place, and time.  Ext: 2nd toe swelling and discoloration Psychiatric: He has a normal mood and affect. His behavior is normal.    Lab Results Recent Labs    02/09/24 0406 02/09/24 0414 02/11/24 0424  WBC  --  5.6 5.5  HGB  --  10.3* 9.0*  HCT  --  32.5* 29.0*  NA 138  --  136  K 5.7*  --  5.2*  CL 101  --  99  CO2 21*  --  21*  BUN 47*  --  47*  CREATININE 8.57*  --  7.62*   Liver Panel Recent Labs    02/09/24 0406 02/11/24 0424  PROT 8.2*  --   ALBUMIN  3.4* 3.0*  AST 20  --   ALT  8  --   ALKPHOS 50  --   BILITOT 0.9  --    Sedimentation Rate Recent Labs    02/09/24 0414  ESRSEDRATE 112*   C-Reactive Protein Recent Labs    02/09/24 0406  CRP 1.4*    Microbiology: --------------- Studies/Results: DG Foot 2 Views Left Result Date: 02/09/2024 CLINICAL DATA:  Skin ulcer of the left great toe EXAM: LEFT FOOT - 2 VIEW COMPARISON:  11/21/2023 FINDINGS: Chronic erosive changes at the first, second, and fourth MTP joints. Additional chronic erosive changes about the 2nd-5th proximal phalanges. Advanced midfoot degenerative arthritis. Pes planus. Soft tissue swelling about the forefoot and great toe. IMPRESSION: Chronic erosive changes about the forefoot are similar to 11/21/2023. Osteomyelitis/septic arthritis of the first and second MTP joints is difficult to exclude. Consider MRI for further evaluation. Electronically  Signed   By: Rozell Cornet M.D.   On: 02/09/2024 20:26     Assessment/Plan: Thoracic discitis/ OM = continue with original plan of cefepime  and vancomycin  with hd x 6wk  Left foot osteomyelitis/charcot foot = consider getting mri of left foot to see if abscess in addn to om/septic arthritis of 1st and 2nd MTP joints. -to help understand if needs 1-2 ray amputaiton vs. TMA. +/- vascular studies/intervention.  evaluation of this patient requires complex antimicrobial therapy evaluation and counseling and isolation needs for disease transmission risk assessment and mitigation.    I have personally spent 50 minutes involved in face-to-face and non-face-to-face activities for this patient on the day of the visit. Professional time spent includes the following activities: Preparing to see the patient (review of tests), Obtaining and/or reviewing separately obtained history (admission/discharge record), Performing a medically appropriate examination and/or evaluation , Ordering medications/tests/procedures, referring and communicating with other health care professionals, Documenting clinical information in the EMR, Independently interpreting results (not separately reported), Communicating results to the patient/family/caregiver, Counseling and educating the patient/family/caregiver and Care coordination (not separately reported).    Thomas Kos [name] MD MPH [facility] [phone]   [facility] Pager: [phone]  02/11/2024, 6:19 PM

## 2024-02-11 NOTE — Care Management Important Message (Signed)
 Important Message  Patient Details  Name: Thomas [name] MRN: [id number] Date of Birth: March 27, 1951   Important Message Given:  Yes - Medicare IM     Wynonia Hedges 02/11/2024, 12:42 PM

## 2024-02-11 NOTE — Progress Notes (Signed)
 Pt receives out-pt HD at [facility] on TTS 10:15 am chair time. Will assist as needed.   Lauraine Polite Renal Navigator [phone]

## 2024-02-12 ENCOUNTER — Inpatient Hospital Stay (HOSPITAL_COMMUNITY)

## 2024-02-12 DIAGNOSIS — L98499 Non-pressure chronic ulcer of skin of other sites with unspecified severity: Secondary | ICD-10-CM | POA: Diagnosis not present

## 2024-02-12 DIAGNOSIS — E039 Hypothyroidism, unspecified: Secondary | ICD-10-CM | POA: Diagnosis not present

## 2024-02-12 DIAGNOSIS — E119 Type 2 diabetes mellitus without complications: Secondary | ICD-10-CM | POA: Diagnosis not present

## 2024-02-12 DIAGNOSIS — I10 Essential (primary) hypertension: Secondary | ICD-10-CM | POA: Diagnosis not present

## 2024-02-12 DIAGNOSIS — M4624 Osteomyelitis of vertebra, thoracic region: Secondary | ICD-10-CM | POA: Diagnosis not present

## 2024-02-12 LAB — GLUCOSE, CAPILLARY
Glucose-Capillary: 145 mg/dL — ABNORMAL HIGH (ref 70–99)
Glucose-Capillary: 87 mg/dL (ref 70–99)
Glucose-Capillary: 117 mg/dL — ABNORMAL HIGH (ref 70–99)
Glucose-Capillary: 157 mg/dL — ABNORMAL HIGH (ref 70–99)

## 2024-02-12 LAB — RENAL FUNCTION PANEL
Albumin: 3.4 g/dL — ABNORMAL LOW (ref 3.5–5.0)
Anion gap: 14 (ref 5–15)
BUN: 34 mg/dL — ABNORMAL HIGH (ref 8–23)
CO2: 21 mmol/L — ABNORMAL LOW (ref 22–32)
Calcium: 8.1 mg/dL — ABNORMAL LOW (ref 8.9–10.3)
Chloride: 98 mmol/L (ref 98–111)
Creatinine, Ser: 5.7 mg/dL — ABNORMAL HIGH (ref 0.61–1.24)
GFR, Estimated: 10 mL/min — ABNORMAL LOW (ref >60)
Glucose, Bld: 98 mg/dL (ref 70–99)
Phosphorus: 5.1 mg/dL — ABNORMAL HIGH (ref 2.5–4.6)
Potassium: 4.8 mmol/L (ref 3.5–5.1)
Sodium: 133 mmol/L — ABNORMAL LOW (ref 135–145)

## 2024-02-12 LAB — SEDIMENTATION RATE: Sed Rate: 130 mm/h — ABNORMAL HIGH (ref 0–16)

## 2024-02-12 MED ORDER — OXYCODONE HCL 5 MG PO TABS
10.0000 mg | ORAL_TABLET | Freq: Four times a day (QID) | ORAL | Status: DC | PRN
  Administered 2024-02-12 – 2024-02-13 (×4): 10 mg via ORAL
  Filled 2024-02-12 (×6): qty 2

## 2024-02-12 MED ORDER — DARBEPOETIN ALFA 100 MCG/0.5ML IJ SOSY
100.0000 ug | PREFILLED_SYRINGE | INTRAMUSCULAR | Status: DC
  Administered 2024-02-20 – 2024-03-12 (×4): 100 ug via SUBCUTANEOUS
  Filled 2024-02-12 (×5): qty 0.5

## 2024-02-12 MED ORDER — POLYETHYLENE GLYCOL 3350 17 G PO PACK
17.0000 g | PACK | Freq: Two times a day (BID) | ORAL | Status: DC
  Administered 2024-02-12 – 2024-03-16 (×50): 17 g via ORAL
  Filled 2024-02-12 (×56): qty 1

## 2024-02-12 MED ORDER — HYDROMORPHONE HCL 1 MG/ML IJ SOLN
1.0000 mg | INTRAMUSCULAR | Status: DC | PRN
  Administered 2024-02-12 – 2024-02-14 (×16): 1.5 mg via INTRAVENOUS
  Administered 2024-02-14: 1 mg via INTRAVENOUS
  Administered 2024-02-14 (×5): 1.5 mg via INTRAVENOUS
  Administered 2024-02-15 – 2024-02-16 (×8): 1 mg via INTRAVENOUS
  Administered 2024-02-16: 1.5 mg via INTRAVENOUS
  Administered 2024-02-16 (×2): 1 mg via INTRAVENOUS
  Administered 2024-02-16 (×2): 1.5 mg via INTRAVENOUS
  Administered 2024-02-17 (×2): 1 mg via INTRAVENOUS
  Filled 2024-02-12 (×2): qty 1
  Filled 2024-02-12 (×6): qty 1.5
  Filled 2024-02-12: qty 1
  Filled 2024-02-12 (×2): qty 1.5
  Filled 2024-02-12: qty 1
  Filled 2024-02-12 (×10): qty 1.5
  Filled 2024-02-12 (×2): qty 1
  Filled 2024-02-12 (×2): qty 1.5
  Filled 2024-02-12: qty 1
  Filled 2024-02-12: qty 1.5
  Filled 2024-02-12: qty 1
  Filled 2024-02-12: qty 1.5
  Filled 2024-02-12 (×2): qty 1
  Filled 2024-02-12: qty 1.5
  Filled 2024-02-12: qty 1
  Filled 2024-02-12: qty 1.5

## 2024-02-12 NOTE — Progress Notes (Signed)
 Ankle-brachial index completed. Please see CV Procedures for preliminary results.  Estanislao Heimlich, RVT 02/12/24 3:37 PM

## 2024-02-12 NOTE — Progress Notes (Signed)
 [facility] Progress Note   Subjective: Seen in room, C/O back pain.  HD 04/24 Net UF 1 Liter  Objective Vitals:   02/12/24 0225 02/12/24 0750 02/12/24 0807 02/12/24 0845  BP: 124/71  136/83 131/89  Pulse: (!) 57  74 78  Resp: 20  14   Temp: 98 F (36.7 C)  98.3 F (36.8 C)   TempSrc: Axillary  Oral   SpO2: 97% 98%    Weight:      Height:       Physical Exam General:Chronically ill appearing male appearing man, NAD. Room air Heart: RRR; 2/6 murmur Lungs: CTAB Abdomen: soft Extremities: R BKA, no LLE edema, dry scabbed skin and bandaged toe Dialysis Access: [facility]  Additional Objective Labs: Basic Metabolic Panel: Recent Labs  Lab 02/09/24 0406 02/11/24 0424 02/12/24 0505  NA 138 136 133*  K 5.7* 5.2* 4.8  CL 101 99 98  CO2 21* 21* 21*  GLUCOSE 58* 94 98  BUN 47* 47* 34*  CREATININE 8.57* 7.62* 5.70*  CALCIUM  8.0* 7.7* 8.1*  PHOS 8.2* 6.8* 5.1*   Liver Function Tests: Recent Labs  Lab 02/08/24 0639 02/09/24 0406 02/11/24 0424 02/12/24 0505  AST 22 20  --   --   ALT 9 8  --   --   ALKPHOS 45 50  --   --   BILITOT 0.7 0.9  --   --   PROT 8.6* 8.2*  --   --   ALBUMIN  3.5 3.4* 3.0* 3.4*   No results for input(s): "LIPASE", "AMYLASE" in the last 168 hours. CBC: Recent Labs  Lab 02/08/24 0639 02/08/24 0643 02/09/24 0414 02/11/24 0424  WBC 5.3  --  5.6 5.5  NEUTROABS  --   --  3.8  --   HGB 10.0* 9.9* 10.3* 9.0*  HCT 32.1* 29.0* 32.5* 29.0*  MCV 109.6*  --  108.0* 107.8*  PLT 151  --  169 147*   Blood Culture    Component Value Date/Time   SDES BLOOD BLOOD RIGHT HAND AEROBIC BOTTLE ONLY 02/09/2024 0414   SPECREQUEST  02/09/2024 0414    BOTTLES DRAWN AEROBIC ONLY Blood Culture adequate volume   CULT  02/09/2024 0414    NO GROWTH 3 DAYS Performed at [facility], [address] [place], [place] [address]    REPTSTATUS PENDING 02/09/2024 [phone]    Cardiac Enzymes: No results for input(s): "CKTOTAL", "CKMB", "CKMBINDEX",  "TROPONINI" in the last 168 hours. CBG: Recent Labs  Lab 02/10/24 2131 02/11/24 0721 02/11/24 1626 02/11/24 2114 02/12/24 0808  GLUCAP 132* 99 116* 120* 145*   Iron  Studies: No results for input(s): "IRON ", "TIBC", "TRANSFERRIN", "FERRITIN" in the last 72 hours. @lablastinr3 @ Studies/Results: No results found. Medications:  ceFEPime  (MAXIPIME ) IV 2 g (02/11/24 1635)   vancomycin  1,000 mg (02/11/24 1115)    amLODipine   10 mg Oral Daily   atorvastatin   80 mg Oral Daily   bethanechol   10 mg Oral BID   Chlorhexidine  Gluconate Cloth  6 each Topical Q0600   cloNIDine   0.1 mg Oral TID   heparin  injection (subcutaneous)  5,000 Units Subcutaneous Q8H   hydrALAZINE   25 mg Oral TID   leptospermum manuka honey  1 Application Topical Daily   levothyroxine   50 mcg Oral QAC breakfast   lidocaine   1 patch Transdermal Once   losartan   50 mg Oral Daily   metoprolol  tartrate  50 mg Oral BID   polyethylene glycol  17 g Oral BID  senna-docusate  4 tablet Oral BID   sevelamer  carbonate  1,600 mg Oral TID WC   tamsulosin   0.4 mg Oral Daily     Dialysis Orders TTS - [place] 4:15hr, 400/A1.5, EDW 92kg, 2K/2.5Ca, TDC, no heparin  - Mircera 225mcg IV q 2 weeks (last 4/9) - Calcitriol  0.43mcg PO q HD   Assessment/Plan:  Thoracic discitis/OM: Recent acute on chronic back pain, s/p fall on 4/20. CT imaging showed T9-10 discitis. He refused MRI, area too small for aspiration per IR. Just finished 8 week course of Dapto/Cefepime  on 3/20 for bacteremia presumed due to foot infection. I do not have full records. Now started on Vanc/Cefepime  for 6 week course for discitis.   ESRD:  Usual TTS schedule - next HD 02/13/2024.  Hypertension/volume: BP ok, no edema. Appears euvolemic, low UF goal next HD d/t severe cramping with last.  Anemia: HGB 4.9 - ESA due 02/10/2024. Will order for tomorrow. .  Metabolic bone disease: Ca ok, Phos high - resumed home binders.  Nutrition: Alb 3.4, follow.  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  Hiilei Gerst H. Nichelle Renwick NP-C 02/12/2024, 10:28 AM  [employer] [phone]

## 2024-02-12 NOTE — Progress Notes (Signed)
 Orthopedic Tech Progress Note Patient Details:  Thomas [name] [birth year] [id number]  Patient ID: Thomas [name], male   DOB: 12/22/1950, 73 y.o.   MRN: [id number] Delivered TLSO to pt. Bedside. He did not want to be fitted at that time because he was in pain. He stated he was getting pain meds in about an hour. Advised him to have nurse reach out to us  when he was more comfortable and we would do the fitting. Pt. Agreed to this. Herbie Loll 02/12/2024, 11:35 AM

## 2024-02-12 NOTE — Progress Notes (Addendum)
 PROGRESS NOTE        PATIENT DETAILS Name: Thomas [name] Age: 73 y.o. Sex: male Date of Birth: [birth year] Admit Date: 02/07/2024 Admitting Physician Roxana Copier, DO ZOX:WRUEAV, [name], MD  Brief Summary: Patient is a 73 y.o.  male who with history of ESRD on HD TTS, history of posterior fusion at T10-12 due to osteomyelitis/discitis/epidural abscess in 2021, history of right BKA who presented with low back pain-upon further evaluation with CT imaging-he was found to have potential acute osteomyelitis involving his thoracic spine and admitted to the hospitalist service.  Significant events: 4/20>> admit to TRH-back pain-concern for thoracic osteomyelitis.  Significant studies: 4/21>> x-ray right elbow: No fracture. 4/21>> x-ray left wrist: No fracture 4/21>> x-ray left shoulder, no fracture. 4/21>> CT head: No acute intracranial abnormality 4/21>> CT C-spine: No fracture. 4/21>> CT abdomen/pelvis: No acute injury to chest/abdomen/pelvis 4/21>> CT T-spine: Prior posterior fusion from T10-T12-abnormal appearance with collapse of T10-lucency around T10 screws at the T9-10 disc space, T10-11 disc space concerning for discitis/osteomyelitis. 4/21>> CT L-spine: No acute bony abnormality, mild chronic compression fracture at L1. 4/22>> x-ray left foot: Chronic erosive changes about the forefoot similar to 11/21/2023-osteomyelitis/septic arthritis of the 1st/2nd MTP joints difficult to exclude.  Significant microbiology data: 4/22>> blood culture: No growth  Procedures: None  Consults: Neurosurgery Infectious disease  Subjective: Asking for pain medications to be adjusted due to worsening back pain.  Objective: Vitals: Blood pressure 124/71, pulse (!) 57, temperature 98 F (36.7 C), temperature source Axillary, resp. rate 20, height 6\' 5"  (1.956 m), weight 93.7 kg, SpO2 98%.   Exam: Gen Exam:Alert awake-not in any distress HEENT:atraumatic,  normocephalic Chest: B/L clear to auscultation anteriorly CVS:S1S2 regular Abdomen:soft non tender, non distended Extremities: Right BKA. 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   Per neurosurgery not an operative candidate Per IR-no window to aspirate Plan is for 6 weeks of vancomycin /cefepime -EOT 6/4 Blood cultures negative so far Given worsening  Possible osteomyelitis of left foot involving 1st/2nd MTP joints. Chronic left second toe ulceration Intolerant to MRI in the past-not keen on getting one He just completed 6 weeks of daptomycin /cefepime  on 3/28 Apparently refused amputation in the past Concerned that he may have seeded his spine through foot infection-he is open to the possibility of getting a amputation now. ABI ordered-will follow results-May need vascular surgery evaluation before orthopedic intervention can be done.  Mechanical fall Numerous imaging studies negative for fracture PT/OT eval.  ESRD on HD TTS Nephrology following and directing HD care  Hyperkalemia Resolved with HD.  Normocytic anemia Secondary to ESRD Aranesp /iron  deferred  to nephrology service.  Chronic HFpEF Euvolemic on exam Fluid removal with HD  HTN BP stable Clonidine /amlodipine /losartan   HLD Statin  Hypothyroidism Synthroid   BPH Flomax /bethanechol   History of right BKA  BMI: Estimated body mass index is 24.5 kg/m as calculated  from the following:   Height as of this encounter: 6\' 5"  (1.956 m).   Weight as of this encounter: 93.7 kg.   Code status:   Code Status: Full Code   DVT Prophylaxis: heparin  injection 5,000 Units Start: 02/10/24 1400 SCDs Start: 02/08/24 2206   Family Communication: None at bedside   Disposition Plan: Status is: Inpatient Remains inpatient appropriate because: Severity of illness   Planned Discharge Destination:Home health   Diet: Diet Order             Diet regular Room service appropriate? Yes; Fluid consistency: Thin  Diet effective now                     Antimicrobial agents: Anti-infectives (From admission, onward)    Start     Dose/Rate Route Frequency Ordered Stop   02/11/24 1800  ceFEPIme  (MAXIPIME ) 2 g in sodium chloride  0.9 % 100 mL IVPB        2 g 200 mL/hr over 30 Minutes Intravenous Every T-Th-Sa (Hemodialysis) 02/10/24 1028     02/11/24 1200  vancomycin  (VANCOCIN ) IVPB 1000 mg/200 mL premix        1,000 mg 200 mL/hr over 60 Minutes Intravenous Every T-Th-Sa (Hemodialysis) 02/10/24 1028     02/11/24 0000  ceFEPime  (MAXIPIME ) IVPB        2 g Intravenous Every T-Th-Sa (Hemodialysis) 02/11/24 0747 03/23/24 2359   02/11/24 0000  vancomycin  IVPB        1,000 mg Intravenous Every T-Th-Sa (Hemodialysis) 02/11/24 0747 03/23/24 2359   02/10/24 1115  vancomycin  (VANCOREADY) IVPB 2000 mg/400 mL        2,000 mg 200 mL/hr over 120 Minutes Intravenous  Once 02/10/24 1028 02/10/24 1446   02/10/24 1115  ceFEPIme  (MAXIPIME ) 2 g in sodium chloride  0.9 % 100 mL IVPB        2 g 200 mL/hr over 30 Minutes Intravenous  Once 02/10/24 1028 02/10/24 1239        MEDICATIONS: Scheduled Meds:  amLODipine   10 mg Oral Daily   atorvastatin   80 mg Oral Daily   bethanechol   10 mg Oral BID   Chlorhexidine  Gluconate Cloth  6 each Topical Q0600   cloNIDine   0.1 mg Oral TID   heparin  injection (subcutaneous)  5,000 Units Subcutaneous Q8H   hydrALAZINE   25 mg Oral TID    leptospermum manuka honey  1 Application Topical Daily   levothyroxine   50 mcg Oral QAC breakfast   lidocaine   1 patch Transdermal Once   losartan   50 mg Oral Daily   metoprolol  tartrate  50 mg Oral BID   polyethylene glycol  17 g Oral BID   senna-docusate  4 tablet Oral BID   sevelamer  carbonate  1,600 mg Oral TID WC   tamsulosin   0.4 mg Oral Daily   Continuous Infusions:  ceFEPime  (MAXIPIME ) IV 2 g (02/11/24 1635)   vancomycin  1,000 mg (02/11/24 1115)   PRN Meds:.acetaminophen  **OR** acetaminophen , HYDROmorphone  (DILAUDID ) injection, melatonin, naLOXone  (NARCAN )  injection, ondansetron  (ZOFRAN ) IV, oxyCODONE , tiZANidine    I have personally reviewed following labs and imaging studies  LABORATORY DATA: CBC: Recent Labs  Lab 02/08/24 0639 02/08/24 0643 02/09/24 0414 02/11/24 0424  WBC 5.3  --  5.6 5.5  NEUTROABS  --   --  3.8  --   HGB 10.0* 9.9* 10.3* 9.0*  HCT 32.1* 29.0* 32.5* 29.0*  MCV 109.6*  --  108.0* 107.8*  PLT 151  --  169 147*    Basic Metabolic Panel: Recent Labs  Lab 02/08/24 0639 02/08/24 0643 02/09/24 0406 02/11/24 0424 02/12/24 0505  NA 139 138 138 136 133*  K 4.8 4.8 5.7* 5.2* 4.8  CL 99 103 101 99 98  CO2 24  --  21* 21* 21*  GLUCOSE 80 76 58* 94 98  BUN 41* 39* 47* 47* 34*  CREATININE 7.79* 8.40* 8.57* 7.62* 5.70*  CALCIUM  8.1*  --  8.0* 7.7* 8.1*  MG  --   --  2.4  --   --   PHOS  --   --  8.2* 6.8* 5.1*    GFR: Estimated Creatinine Clearance: 15.2 mL/min (A) (by C-G formula based on SCr of 5.7 mg/dL (H)).  Liver Function Tests: Recent Labs  Lab 02/08/24 0639 02/09/24 0406 02/11/24 0424 02/12/24 0505  AST 22 20  --   --   ALT 9 8  --   --   ALKPHOS 45 50  --   --   BILITOT 0.7 0.9  --   --   PROT 8.6* 8.2*  --   --   ALBUMIN  3.5 3.4* 3.0* 3.4*   No results for input(s): "LIPASE", "AMYLASE" in the last 168 hours. No results for input(s): "AMMONIA" in the last 168 hours.  Coagulation Profile: Recent Labs  Lab  02/08/24 0639 02/09/24 1931  INR 1.1 1.1    Cardiac Enzymes: No results for input(s): "CKTOTAL", "CKMB", "CKMBINDEX", "TROPONINI" in the last 168 hours.  BNP (last 3 results) No results for input(s): "PROBNP" in the last 8760 hours.  Lipid Profile: No results for input(s): "CHOL", "HDL", "LDLCALC", "TRIG", "CHOLHDL", "LDLDIRECT" in the last 72 hours.  Thyroid Function Tests: No results for input(s): "TSH", "T4TOTAL", "FREET4", "T3FREE", "THYROIDAB" in the last 72 hours.  Anemia Panel: No results for input(s): "VITAMINB12", "FOLATE", "FERRITIN", "TIBC", "IRON ", "RETICCTPCT" in the last 72 hours.   Urine analysis:    Component Value Date/Time   COLORURINE YELLOW 01/14/2022 0248   APPEARANCEUR HAZY (A) 01/14/2022 0248   LABSPEC 1.009 01/14/2022 0248   PHURINE 7.0 01/14/2022 0248   GLUCOSEU NEGATIVE 01/14/2022 0248   HGBUR MODERATE (A) 01/14/2022 0248   BILIRUBINUR NEGATIVE 01/14/2022 0248   KETONESUR NEGATIVE 01/14/2022 0248   PROTEINUR 100 (A) 01/14/2022 0248   NITRITE NEGATIVE 01/14/2022 0248   LEUKOCYTESUR LARGE (A) 01/14/2022 0248    Sepsis Labs: Lactic Acid, Venous    Component Value Date/Time   LATICACIDVEN 1.0 12/31/2021 0048    MICROBIOLOGY: Recent Results (from the past 240 hours)  Culture, blood (Routine X 2) w Reflex to ID Panel     Status: None (Preliminary result)   Collection Time: 02/09/24  4:06 AM   Specimen: BLOOD  Result Value Ref Range Status   Specimen Description BLOOD BLOOD RIGHT HAND  Final   Special Requests   Final    BOTTLES DRAWN AEROBIC ONLY Blood Culture adequate volume   Culture   Final    NO GROWTH 2 DAYS Performed at [facility], [address], [place], [place] [address]    Report Status PENDING  Incomplete  Culture, blood (Routine X 2) w Reflex to ID Panel     Status: None (Preliminary result)   Collection Time: 02/09/24  4:14 AM   Specimen: BLOOD  Result Value Ref Range Status   Specimen Description BLOOD BLOOD  RIGHT HAND AEROBIC BOTTLE ONLY  Final   Special Requests   Final    BOTTLES DRAWN AEROBIC ONLY Blood Culture adequate volume   Culture   Final    NO GROWTH 2 DAYS Performed at [facility], [address] [place], [place] [address]    Report Status PENDING  Incomplete    RADIOLOGY STUDIES/RESULTS: No results found.    LOS: 4 days   Kimberly Penna, MD  [facility]    To contact the attending provider between 7A-7P or the covering provider during after hours 7P-7A, please log into the web site [email or web address] and access using universal [place] password for that web site. If you do not have the password, please call the hospital operator.  02/12/2024, 8:14 AM

## 2024-02-13 DIAGNOSIS — E039 Hypothyroidism, unspecified: Secondary | ICD-10-CM | POA: Diagnosis not present

## 2024-02-13 DIAGNOSIS — E119 Type 2 diabetes mellitus without complications: Secondary | ICD-10-CM | POA: Diagnosis not present

## 2024-02-13 DIAGNOSIS — I10 Essential (primary) hypertension: Secondary | ICD-10-CM | POA: Diagnosis not present

## 2024-02-13 DIAGNOSIS — M4624 Osteomyelitis of vertebra, thoracic region: Secondary | ICD-10-CM | POA: Diagnosis not present

## 2024-02-13 LAB — BASIC METABOLIC PANEL WITH GFR
Anion gap: 15 (ref 5–15)
BUN: 57 mg/dL — ABNORMAL HIGH (ref 8–23)
CO2: 22 mmol/L (ref 22–32)
Calcium: 8.1 mg/dL — ABNORMAL LOW (ref 8.9–10.3)
Chloride: 99 mmol/L (ref 98–111)
Creatinine, Ser: 7.31 mg/dL — ABNORMAL HIGH (ref 0.61–1.24)
GFR, Estimated: 7 mL/min — ABNORMAL LOW (ref >60)
Glucose, Bld: 125 mg/dL — ABNORMAL HIGH (ref 70–99)
Potassium: 4.6 mmol/L (ref 3.5–5.1)
Sodium: 136 mmol/L (ref 135–145)

## 2024-02-13 LAB — GLUCOSE, CAPILLARY
Glucose-Capillary: 94 mg/dL (ref 70–99)
Glucose-Capillary: 109 mg/dL — ABNORMAL HIGH (ref 70–99)
Glucose-Capillary: 126 mg/dL — ABNORMAL HIGH (ref 70–99)

## 2024-02-13 MED ORDER — ALTEPLASE 2 MG IJ SOLR: 2.0000 mg | Freq: Once | INTRAMUSCULAR | Status: DC | PRN

## 2024-02-13 MED ORDER — LIDOCAINE HCL (PF) 1 % IJ SOLN: 5.0000 mL | INTRAMUSCULAR | Status: DC | PRN

## 2024-02-13 MED ORDER — HEPARIN SODIUM (PORCINE) 1000 UNIT/ML DIALYSIS: 1000.0000 [IU] | INTRAMUSCULAR | Status: DC | PRN

## 2024-02-13 MED ORDER — LIDOCAINE-PRILOCAINE 2.5-2.5 % EX CREA: 1.0000 | TOPICAL_CREAM | CUTANEOUS | Status: DC | PRN

## 2024-02-13 MED ORDER — VANCOMYCIN HCL IN DEXTROSE 1-5 GM/200ML-% IV SOLN
INTRAVENOUS | Status: AC
  Filled 2024-02-13: qty 200

## 2024-02-13 MED ORDER — HEPARIN SODIUM (PORCINE) 1000 UNIT/ML IJ SOLN
INTRAMUSCULAR | Status: AC
  Filled 2024-02-13: qty 4

## 2024-02-13 MED ORDER — HYDROMORPHONE HCL 1 MG/ML IJ SOLN
INTRAMUSCULAR | Status: AC
  Filled 2024-02-13: qty 1.5

## 2024-02-13 MED ORDER — HEPARIN SODIUM (PORCINE) 1000 UNIT/ML DIALYSIS
1000.0000 [IU] | INTRAMUSCULAR | Status: DC | PRN
  Administered 2024-02-13: 1000 [IU]

## 2024-02-13 MED ORDER — ANTICOAGULANT SODIUM CITRATE 4% (200MG/5ML) IV SOLN: 5.0000 mL | Status: DC | PRN

## 2024-02-13 MED ORDER — PENTAFLUOROPROP-TETRAFLUOROETH EX AERO: 1.0000 | INHALATION_SPRAY | CUTANEOUS | Status: DC | PRN

## 2024-02-13 MED ORDER — NEPRO/CARBSTEADY PO LIQD: 237.0000 mL | ORAL | Status: DC | PRN

## 2024-02-13 MED ORDER — ANTICOAGULANT SODIUM CITRATE 4% (200MG/5ML) IV SOLN
5.0000 mL | Status: DC | PRN
Start: 2024-02-13 — End: 2024-02-13

## 2024-02-13 NOTE — Progress Notes (Signed)
 Patient A/Ox4 and stable at time of transport to dialysis.

## 2024-02-13 NOTE — Progress Notes (Signed)
 Patient A/Ox4 and stable at time of return from hemodialysis.

## 2024-02-13 NOTE — Progress Notes (Signed)
 [facility] Progress Note   Subjective: Seen in room. Still C/O pain but overall looks much better.  HD later today on schedule.    Objective Vitals:   02/12/24 2356 02/13/24 0345 02/13/24 0500 02/13/24 0744  BP: 113/73 117/72  (!) 156/109  Pulse: (!) 58   (!) 55  Resp: 12 12  13   Temp: 97.7 F (36.5 C) 97.6 F (36.4 C)  (!) 97.4 F (36.3 C)  TempSrc: Oral Oral  Oral  SpO2: 100% 97%  100%  Weight:   97.6 kg   Height:       Physical Exam General:Chronically ill appearing male appearing man, NAD. Room air Heart: RRR; 2/6 murmur Lungs: CTAB Abdomen: soft Extremities: R BKA, no LLE edema, dry scabbed skin and bandaged toe Dialysis Access: [facility] dr[facility] intact  Additional Objective Labs: Basic Metabolic Panel: Recent Labs  Lab 02/09/24 0406 02/11/24 0424 02/12/24 0505  NA 138 136 133*  K 5.7* 5.2* 4.8  CL 101 99 98  CO2 21* 21* 21*  GLUCOSE 58* 94 98  BUN 47* 47* 34*  CREATININE 8.57* 7.62* 5.70*  CALCIUM  8.0* 7.7* 8.1*  PHOS 8.2* 6.8* 5.1*   Liver Function Tests: Recent Labs  Lab 02/08/24 0639 02/09/24 0406 02/11/24 0424 02/12/24 0505  AST 22 20  --   --   ALT 9 8  --   --   ALKPHOS 45 50  --   --   BILITOT 0.7 0.9  --   --   PROT 8.6* 8.2*  --   --   ALBUMIN  3.5 3.4* 3.0* 3.4*   No results for input(s): "LIPASE", "AMYLASE" in the last 168 hours. CBC: Recent Labs  Lab 02/08/24 0639 02/08/24 0643 02/09/24 0414 02/11/24 0424  WBC 5.3  --  5.6 5.5  NEUTROABS  --   --  3.8  --   HGB 10.0* 9.9* 10.3* 9.0*  HCT 32.1* 29.0* 32.5* 29.0*  MCV 109.6*  --  108.0* 107.8*  PLT 151  --  169 147*   Blood Culture    Component Value Date/Time   SDES BLOOD BLOOD RIGHT HAND AEROBIC BOTTLE ONLY 02/09/2024 0414   SPECREQUEST  02/09/2024 0414    BOTTLES DRAWN AEROBIC ONLY Blood Culture adequate volume   CULT  02/09/2024 0414    NO GROWTH 4 DAYS Performed at [facility], [address] [place], [place] [address]    REPTSTATUS PENDING  02/09/2024 [id number]    Cardiac Enzymes: No results for input(s): "CKTOTAL", "CKMB", "CKMBINDEX", "TROPONINI" in the last 168 hours. CBG: Recent Labs  Lab 02/12/24 1202 02/12/24 1749 02/12/24 2137 02/13/24 0743 02/13/24 1059  GLUCAP 87 117* 157* 94 109*   Iron  Studies: No results for input(s): "IRON ", "TIBC", "TRANSFERRIN", "FERRITIN" in the last 72 hours. @lablastinr3 @ Studies/Results: VAS US  ABI WITH/WO TBI Result Date: 02/12/2024  LOWER EXTREMITY DOPPLER STUDY Patient Name:  ARLOW COACHMAN  Date of Exam:   02/12/2024 Medical Rec #: [id number]        Accession #:    [id number] Date of Birth: 25-Jul-1951        Patient Gender: M Patient Age:   73 years Exam Location:  [facility] Procedure:      VAS US  ABI WITH/WO TBI Referring Phys: Kimberly Penna --------------------------------------------------------------------------------  Indications: Ulceration. High Risk Factors: Hypertension.  Performing Technologist: Estanislao Heimlich  Examination Guidelines: A complete evaluation includes at minimum, Doppler waveform signals and systolic blood pressure reading at the level of bilateral  brachial, anterior tibial, and posterior tibial arteries, when vessel segments are accessible. Bilateral testing is considered an integral part of a complete examination. Photoelectric Plethysmograph (PPG) waveforms and toe systolic pressure readings are included as required and additional duplex testing as needed. Limited examinations for reoccurring indications may be performed as noted.  ABI Findings: +---------+------------------+-----+---------+--------+ Right    Rt Pressure (mmHg)IndexWaveform Comment  +---------+------------------+-----+---------+--------+ Brachial 113                    triphasic         +---------+------------------+-----+---------+--------+ PTA                                      BKA      +---------+------------------+-----+---------+--------+ PERO                                      BKA      +---------+------------------+-----+---------+--------+ Great Toe                                BKA      +---------+------------------+-----+---------+--------+ +---------+------------------+-----+---------+-------+ Left     Lt Pressure (mmHg)IndexWaveform Comment +---------+------------------+-----+---------+-------+ Brachial                                 AVF     +---------+------------------+-----+---------+-------+ PTA      154               1.36 triphasic        +---------+------------------+-----+---------+-------+ DP       123               1.09 triphasic        +---------+------------------+-----+---------+-------+ Great Toe201               1.78 Normal           +---------+------------------+-----+---------+-------+  Summary: Left: Resting left ankle-brachial index is within normal range. The left toe-brachial index is normal. *See table(s) above for measurements and observations.  Electronically signed by Jimmye Moulds MD on 02/12/2024 at 4:41:43 PM.    Final    Medications:  ceFEPime  (MAXIPIME ) IV 2 g (02/11/24 1635)   vancomycin  1,000 mg (02/11/24 1115)    amLODipine   10 mg Oral Daily   atorvastatin   80 mg Oral Daily   bethanechol   10 mg Oral BID   Chlorhexidine  Gluconate Cloth  6 each Topical Q0600   cloNIDine   0.1 mg Oral TID   darbepoetin (ARANESP ) injection - DIALYSIS  100 mcg Subcutaneous Q Sat-1800   heparin  injection (subcutaneous)  5,000 Units Subcutaneous Q8H   hydrALAZINE   25 mg Oral TID   leptospermum manuka honey  1 Application Topical Daily   levothyroxine   50 mcg Oral QAC breakfast   losartan   50 mg Oral Daily   metoprolol  tartrate  50 mg Oral BID   polyethylene glycol  17 g Oral BID   senna-docusate  4 tablet Oral BID   sevelamer  carbonate  1,600 mg Oral TID WC   tamsulosin   0.4 mg Oral Daily     Dialysis Orders TTS - [place] 4:15hr, 400/A1.5, EDW 92kg, 2K/2.5Ca, TDC, no heparin  - Mircera 225mcg IV q  2 weeks (last 4/9) -  Calcitriol  0.25mcg PO q HD   Assessment/Plan:  Thoracic discitis/OM: Recent acute on chronic back pain, s/p fall on 4/20. CT imaging showed T9-10 discitis. He refused MRI, area too small for aspiration per IR. Just finished 8 week course of Dapto/Cefepime  on 3/20 for bacteremia presumed due to foot infection. I do not have full records. Now started on Vanc/Cefepime  for 6 week course for discitis.   ESRD:  Usual TTS schedule - next HD 02/13/2024.  Hypertension/volume: BP ok, no edema. Appears euvolemic, low UF goal next HD d/t severe cramping with last.  Anemia: HGB 4.9 - ESA due 02/10/2024. Will order for tomorrow. .  Metabolic bone disease: Ca ok, Phos high - resumed home binders.  Nutrition: Alb 3.4, follow.  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  Jaleya Pebley H. Lexie Morini NP-C 02/13/2024, 11:25 AM  [employer] [phone]

## 2024-02-13 NOTE — Progress Notes (Signed)
 PT Cancellation Note  Patient Details Name: Thomas [name] MRN: [id number] DOB: 1951/01/26   Cancelled Treatment:    Reason Eval/Treat Not Completed: Patient at procedure or test/unavailable (Pt off floor for HD. Will follow-up as schedule permits.)  Glenford Lanes, PT, DPT Acute Rehabilitation Services Office: [phone] Secure Chat Preferred  [name] 02/13/2024, 2:48 PM

## 2024-02-13 NOTE — Progress Notes (Signed)
   02/13/24 1803  Vitals  Temp 98.1 F (36.7 C)  Pulse Rate 69  Resp 18  BP (!) 151/90  SpO2 100 %  O2 Device Room Air  Weight 95.9 kg (Taken via bed.)  Oxygen Therapy  Patient Activity (if Appropriate) In bed  Pulse Oximetry Type Continuous  Oximetry Probe Site Changed No  Post Treatment  Dialyzer Clearance Clear  Hemodialysis Intake (mL) 0 mL  Liters Processed 72  Fluid Removed (mL) 800 mL  Tolerated HD Treatment Yes  AVG/AVF Arterial Site Held (minutes) 0 minutes  AVG/AVF Venous Site Held (minutes) 0 minutes   Received patient in bed to unit.  Alert and oriented.  Informed consent signed and in chart.   TX duration: 3  Patient tolerated well.  Transported back to the room  Alert, without acute distress.  Hand-off given to patient's nurse.   Access used: Yes Access issues: No  Total UF removed: 800 Medication(s) given: See MAR Post HD VS: See Above Grid Post HD weight: 95.9 kg   [facility]

## 2024-02-13 NOTE — Progress Notes (Signed)
 PT Cancellation Note  Patient Details Name: Thomas [name] MRN: [id number] DOB: 02-Dec-1950   Cancelled Treatment:    Reason Eval/Treat Not Completed: Pain limiting ability to participate;Patient declined, no reason specified (Pt politely refused PT session citing too much pain and a muscle spasm going down his entire spine. Pt reports 9/10 pain and says his RN recently gave him medicine, so he wants that to kick in.) Encouraged pt to allow Ortho Tech to come back and fit him for his TLSO once pain was better managed. Will follow-up for PT treatment as schedule permits.   Glenford Lanes, PT, DPT Acute Rehabilitation Services Office: [phone] Secure Chat Preferred  [name] 02/13/2024, 12:42 PM

## 2024-02-13 NOTE — Plan of Care (Signed)
  Problem: Education: Goal: Ability to describe self-care measures that may prevent or decrease complications (Diabetes Survival Skills Education) will improve Outcome: Progressing Goal: Individualized Educational Video(s) Outcome: Progressing   Problem: Coping: Goal: Ability to adjust to condition or change in health will improve Outcome: Progressing   Problem: Fluid Volume: Goal: Ability to maintain a balanced intake and output will improve Outcome: Progressing   Problem: Health Behavior/Discharge Planning: Goal: Ability to identify and utilize available resources and services will improve Outcome: Progressing Goal: Ability to manage health-related needs will improve Outcome: Progressing   Problem: Metabolic: Goal: Ability to maintain appropriate glucose levels will improve Outcome: Progressing   Problem: Nutritional: Goal: Maintenance of adequate nutrition will improve Outcome: Progressing Goal: Progress toward achieving an optimal weight will improve Outcome: Progressing   Problem: Skin Integrity: Goal: Risk for impaired skin integrity will decrease Outcome: Progressing   Problem: Tissue Perfusion: Goal: Adequacy of tissue perfusion will improve Outcome: Progressing   Problem: Education: Goal: Knowledge of General Education information will improve Description: Including pain rating scale, medication(s)/side effects and non-pharmacologic comfort measures Outcome: Progressing   Problem: Health Behavior/Discharge Planning: Goal: Ability to manage health-related needs will improve Outcome: Progressing   Problem: Clinical Measurements: Goal: Ability to maintain clinical measurements within normal limits will improve Outcome: Progressing Goal: Will remain free from infection Outcome: Progressing Goal: Diagnostic test results will improve Outcome: Progressing Goal: Respiratory complications will improve Outcome: Progressing Goal: Cardiovascular complication will  be avoided Outcome: Progressing   Problem: Nutrition: Goal: Adequate nutrition will be maintained Outcome: Progressing   Problem: Coping: Goal: Level of anxiety will decrease Outcome: Progressing   Problem: Elimination: Goal: Will not experience complications related to bowel motility Outcome: Progressing Goal: Will not experience complications related to urinary retention Outcome: Progressing   Problem: Safety: Goal: Ability to remain free from injury will improve Outcome: Progressing   Problem: Skin Integrity: Goal: Risk for impaired skin integrity will decrease Outcome: Progressing   Problem: Activity: Goal: Risk for activity intolerance will decrease Outcome: Not Progressing   Problem: Pain Managment: Goal: General experience of comfort will improve and/or be controlled Outcome: Not Progressing Note: Complaints of 10/10 pain even with med administration.

## 2024-02-13 NOTE — Progress Notes (Signed)
 PROGRESS NOTE        PATIENT DETAILS Name: Thomas [name] Age: 73 y.o. Sex: male Date of Birth: October 18, 1951 Admit Date: 02/07/2024 Admitting Physician Roxana Copier, DO VHQ:IONGEX, [name], MD  Brief Summary: Patient is a 73 y.o.  male who with history of ESRD on HD TTS, history of posterior fusion at T10-12 due to osteomyelitis/discitis/epidural abscess in 2021, history of right BKA who presented with low back pain-upon further evaluation with CT imaging-he was found to have potential acute osteomyelitis involving his thoracic spine and admitted to the hospitalist service.  Significant events: 4/20>> admit to [facility]-back pain-concern for thoracic osteomyelitis.  Significant studies: 4/21>> x-ray right elbow: No fracture. 4/21>> x-ray left wrist: No fracture 4/21>> x-ray left shoulder, no fracture. 4/21>> CT head: No acute intracranial abnormality 4/21>> CT C-spine: No fracture. 4/21>> CT abdomen/pelvis: No acute injury to chest/abdomen/pelvis 4/21>> CT T-spine: Prior posterior fusion from T10-T12-abnormal appearance with collapse of T10-lucency around T10 screws at the T9-10 disc space, T10-11 disc space concerning for discitis/osteomyelitis. 4/21>> CT L-spine: No acute bony abnormality, mild chronic compression fracture at L1. 4/22>> x-ray left foot: Chronic erosive changes about the forefoot similar to 11/21/2023-osteomyelitis/septic arthritis of the 1st/2nd MTP joints difficult to exclude. 4/25>> left ABI: Within normal limit.  Significant microbiology data: 4/22>> blood culture: No growth  Procedures: None  Consults: Neurosurgery Infectious disease  Subjective: Continues to have some back pain but also complains of "cramps"  Objective: Vitals: Blood pressure (!) 156/109, pulse (!) 55, temperature (!) 97.4 F (36.3 C), temperature source Oral, resp. rate 13, height 6\' 5"  (1.956 m), weight 97.6 kg, SpO2 100%.   Exam: Gen Exam:Alert awake-not in  any distress HEENT:atraumatic, normocephalic Chest: B/L clear to auscultation anteriorly CVS:S1S2 regular Abdomen:soft non tender, non distended Extremities: right BKA. 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   Per neurosurgery not an operative candidate Per IR-no window to aspirate Plan is for 6 weeks of vancomycin /cefepime -EOT 6/4 Blood cultures negative so far Given worsening  Possible osteomyelitis of left foot involving 1st/2nd MTP joints. Chronic left second toe ulceration Intolerant to MRI in the past-not keen on getting one He just completed 6 weeks of daptomycin /cefepime  on 3/28-has refused amputation.  Concern is that he may have seeded his spine through foot infection. He is now open to amputation-ABI stable-will touch base with Dr. Julio Ohm early next week.  Mechanical fall Numerous imaging studies negative for fracture PT/OT eval.  ESRD on HD TTS Nephrology following and directing HD care  Hyperkalemia Resolved with HD.  Normocytic anemia Secondary to ESRD Aranesp /iron  deferred to nephrology service.  Chronic HFpEF Euvolemic on exam Fluid removal with HD  HTN BP stable Clonidine /amlodipine /losartan   HLD Statin  Hypothyroidism Synthroid   BPH Flomax /bethanechol   History of right BKA  BMI: Estimated body mass index is 25.52 kg/m as calculated from the following:   Height as of this encounter: 6\' 5"  (1.956 m).    Weight as of this encounter: 97.6 kg.   Code status:   Code Status: Full Code   DVT Prophylaxis: heparin  injection 5,000 Units Start: 02/10/24 1400 SCDs Start: 02/08/24 2206   Family Communication: None at bedside   Disposition Plan: Status is: Inpatient Remains inpatient appropriate because: Severity of illness   Planned Discharge Destination:Home health   Diet: Diet Order             Diet regular Room service appropriate? Yes; Fluid consistency: Thin  Diet effective now                     Antimicrobial agents: Anti-infectives (From admission, onward)    Start     Dose/Rate Route Frequency Ordered Stop   02/11/24 1800  ceFEPIme  (MAXIPIME ) 2 g in sodium chloride  0.9 % 100 mL IVPB        2 g 200 mL/hr over 30 Minutes Intravenous Every T-Th-Sa (Hemodialysis) 02/10/24 1028     02/11/24 1200  vancomycin  (VANCOCIN ) IVPB 1000 mg/200 mL premix        1,000 mg 200 mL/hr over 60 Minutes Intravenous Every T-Th-Sa (Hemodialysis) 02/10/24 1028     02/11/24 0000  ceFEPime  (MAXIPIME ) IVPB        2 g Intravenous Every T-Th-Sa (Hemodialysis) 02/11/24 0747 03/23/24 2359   02/11/24 0000  vancomycin  IVPB        1,000 mg Intravenous Every T-Th-Sa (Hemodialysis) 02/11/24 0747 03/23/24 2359   02/10/24 1115  vancomycin  (VANCOREADY) IVPB 2000 mg/400 mL        2,000 mg 200 mL/hr over 120 Minutes Intravenous  Once 02/10/24 1028 02/10/24 1446   02/10/24 1115  ceFEPIme  (MAXIPIME ) 2 g in sodium chloride  0.9 % 100 mL IVPB        2 g 200 mL/hr over 30 Minutes Intravenous  Once 02/10/24 1028 02/10/24 1239        MEDICATIONS: Scheduled Meds:  amLODipine   10 mg Oral Daily   atorvastatin   80 mg Oral Daily   bethanechol   10 mg Oral BID   Chlorhexidine  Gluconate Cloth  6 each Topical Q0600   cloNIDine   0.1 mg Oral TID   darbepoetin (ARANESP ) injection - DIALYSIS  100 mcg Subcutaneous Q Sat-1800   heparin  injection (subcutaneous)  5,000 Units Subcutaneous Q8H   hydrALAZINE   25 mg Oral  TID   leptospermum manuka honey  1 Application Topical Daily   levothyroxine   50 mcg Oral QAC breakfast   losartan   50 mg Oral Daily   metoprolol  tartrate  50 mg Oral BID   polyethylene glycol  17 g Oral BID   senna-docusate  4 tablet Oral BID   sevelamer  carbonate  1,600 mg Oral TID WC   tamsulosin   0.4 mg Oral Daily   Continuous Infusions:  ceFEPime  (MAXIPIME ) IV 2 g (02/11/24 1635)   vancomycin  1,000 mg (02/11/24 1115)   PRN Meds:.acetaminophen  **OR** acetaminophen , HYDROmorphone  (DILAUDID ) injection, melatonin, naLOXone  (NARCAN )  injection, ondansetron  (ZOFRAN ) IV, oxyCODONE , tiZANidine    I have personally reviewed following labs and imaging studies  LABORATORY DATA: CBC: Recent Labs  Lab 02/08/24 0639 02/08/24 0643 02/09/24 0414 02/11/24 0424  WBC 5.3  --  5.6 5.5  NEUTROABS  --   --  3.8  --   HGB 10.0* 9.9* 10.3* 9.0*  HCT 32.1* 29.0* 32.5* 29.0*  MCV 109.6*  --  108.0* 107.8*  PLT 151  --  169 147*    Basic Metabolic Panel: Recent Labs  Lab 02/08/24 0639 02/08/24 0643 02/09/24 0406 02/11/24 0424 02/12/24 0505  NA 139 138 138 136 133*  K 4.8 4.8 5.7* 5.2* 4.8  CL 99 103 101 99 98  CO2 24  --  21* 21* 21*  GLUCOSE 80 76 58* 94 98  BUN 41* 39* 47* 47* 34*  CREATININE 7.79* 8.40* 8.57* 7.62* 5.70*  CALCIUM  8.1*  --  8.0* 7.7* 8.1*  MG  --   --  2.4  --   --   PHOS  --   --  8.2* 6.8* 5.1*    GFR: Estimated Creatinine Clearance: 15.2 mL/min (A) (by C-G formula based on SCr of 5.7 mg/dL (H)).  Liver Function Tests: Recent Labs  Lab 02/08/24 0639 02/09/24 0406 02/11/24 0424 02/12/24 0505  AST 22 20  --   --   ALT 9 8  --   --   ALKPHOS 45 50  --   --   BILITOT 0.7 0.9  --   --   PROT 8.6* 8.2*  --   --   ALBUMIN  3.5 3.4* 3.0* 3.4*   No results for input(s): "LIPASE", "AMYLASE" in the last 168 hours. No results for input(s): "AMMONIA" in the last 168 hours.  Coagulation Profile: Recent Labs  Lab 02/08/24 0639 02/09/24 1931  INR 1.1 1.1     Cardiac Enzymes: No results for input(s): "CKTOTAL", "CKMB", "CKMBINDEX", "TROPONINI" in the last 168 hours.  BNP (last 3 results) No results for input(s): "PROBNP" in the last 8760 hours.  Lipid Profile: No results for input(s): "CHOL", "HDL", "LDLCALC", "TRIG", "CHOLHDL", "LDLDIRECT" in the last 72 hours.  Thyroid Function Tests: No results for input(s): "TSH", "T4TOTAL", "FREET4", "T3FREE", "THYROIDAB" in the last 72 hours.  Anemia Panel: No results for input(s): "VITAMINB12", "FOLATE", "FERRITIN", "TIBC", "IRON ", "RETICCTPCT" in the last 72 hours.   Urine analysis:    Component Value Date/Time   COLORURINE YELLOW 01/14/2022 0248   APPEARANCEUR HAZY (A) 01/14/2022 0248   LABSPEC 1.009 01/14/2022 0248   PHURINE 7.0 01/14/2022 0248   GLUCOSEU NEGATIVE 01/14/2022 0248   HGBUR MODERATE (A) 01/14/2022 0248   BILIRUBINUR NEGATIVE 01/14/2022 0248   KETONESUR NEGATIVE 01/14/2022 0248   PROTEINUR 100 (A) 01/14/2022 0248   NITRITE NEGATIVE 01/14/2022 0248   LEUKOCYTESUR LARGE (A) 01/14/2022 0248    Sepsis Labs: Lactic Acid, Venous    Component Value Date/Time   LATICACIDVEN 1.0 12/31/2021 0048    MICROBIOLOGY: Recent Results (from the past 240 hours)  Culture, blood (Routine X 2) w Reflex to ID Panel     Status: None (Preliminary result)   Collection Time: 02/09/24  4:06 AM   Specimen: BLOOD  Result Value Ref Range Status   Specimen Description BLOOD BLOOD RIGHT HAND  Final   Special Requests   Final    BOTTLES DRAWN AEROBIC ONLY Blood Culture adequate volume   Culture   Final    NO GROWTH 4 DAYS Performed at [facility], [address] [place], [place] [address]    Report Status PENDING  Incomplete  Culture, blood (Routine X 2) w Reflex to ID Panel     Status: None (Preliminary result)   Collection Time: 02/09/24  4:14 AM   Specimen: BLOOD  Result Value Ref Range Status   Specimen Description BLOOD BLOOD RIGHT HAND AEROBIC BOTTLE ONLY  Final   Special  Requests   Final    BOTTLES DRAWN AEROBIC ONLY Blood Culture adequate volume   Culture   Final    NO GROWTH 4 DAYS Performed at [facility], [address] [place], [place] [address]    Report Status PENDING  Incomplete    RADIOLOGY STUDIES/RESULTS: VAS US  ABI WITH/WO TBI Result Date: 02/12/2024  LOWER EXTREMITY DOPPLER STUDY Patient Name:  DARL FRUEHAUF  Date of Exam:   02/12/2024 Medical Rec #: [id number]        Accession #:    [id number] Date of Birth: [birth year]        Patient Gender: M Patient Age:   60 years Exam Location:  [facility] Procedure:      VAS US  ABI WITH/WO TBI Referring Phys: Kimberly Penna --------------------------------------------------------------------------------  Indications: Ulceration. High Risk Factors: Hypertension.  Performing Technologist: Estanislao Heimlich  Examination Guidelines: A complete evaluation includes at minimum, Doppler waveform signals and systolic blood pressure reading at the level of bilateral brachial, anterior tibial, and posterior tibial arteries, when vessel segments are accessible. Bilateral testing is considered an integral part of a complete examination. Photoelectric Plethysmograph (PPG) waveforms and toe systolic pressure readings are included as required and additional duplex testing as needed. Limited examinations for reoccurring indications may be performed as noted.  ABI Findings: +---------+------------------+-----+---------+--------+ Right    Rt Pressure (mmHg)IndexWaveform Comment  +---------+------------------+-----+---------+--------+ Brachial 113                    triphasic         +---------+------------------+-----+---------+--------+ PTA                                      BKA      +---------+------------------+-----+---------+--------+ PERO                                     BKA      +---------+------------------+-----+---------+--------+ Great Toe                                BKA       +---------+------------------+-----+---------+--------+ +---------+------------------+-----+---------+-------+ Left     Lt Pressure (mmHg)IndexWaveform Comment +---------+------------------+-----+---------+-------+ Brachial                                 AVF     +---------+------------------+-----+---------+-------+ PTA      154               1.36 triphasic        +---------+------------------+-----+---------+-------+ DP       123               1.09 triphasic        +---------+------------------+-----+---------+-------+ Great Toe201               1.78 Normal           +---------+------------------+-----+---------+-------+  Summary: Left: Resting left ankle-brachial index is within normal range. The left toe-brachial index is normal. *See table(s) above for measurements and observations.  Electronically signed by Veryl Gottron  Clark MD on 02/12/2024 at 4:41:43 PM.    Final       LOS: 5 days   Kimberly Penna, MD  [facility]    To contact the attending provider between 7A-7P or the covering provider during after hours 7P-7A, please log into the web site [email or web address] and access using universal [place] password for that web site. If you do not have the password, please call the hospital operator.  02/13/2024, 9:35 AM

## 2024-02-14 DIAGNOSIS — M4624 Osteomyelitis of vertebra, thoracic region: Secondary | ICD-10-CM | POA: Diagnosis not present

## 2024-02-14 DIAGNOSIS — E039 Hypothyroidism, unspecified: Secondary | ICD-10-CM | POA: Diagnosis not present

## 2024-02-14 DIAGNOSIS — I10 Essential (primary) hypertension: Secondary | ICD-10-CM | POA: Diagnosis not present

## 2024-02-14 DIAGNOSIS — E119 Type 2 diabetes mellitus without complications: Secondary | ICD-10-CM | POA: Diagnosis not present

## 2024-02-14 LAB — CBC
HCT: 31.4 % — ABNORMAL LOW (ref 39.0–52.0)
Hemoglobin: 10.2 g/dL — ABNORMAL LOW (ref 13.0–17.0)
MCH: 34.2 pg — ABNORMAL HIGH (ref 26.0–34.0)
MCHC: 32.5 g/dL (ref 30.0–36.0)
MCV: 105.4 fL — ABNORMAL HIGH (ref 80.0–100.0)
Platelets: 142 10*3/uL — ABNORMAL LOW (ref 150–400)
RBC: 2.98 MIL/uL — ABNORMAL LOW (ref 4.22–5.81)
RDW: 14.2 % (ref 11.5–15.5)
WBC: 6 10*3/uL (ref 4.0–10.5)
nRBC: 0 % (ref 0.0–0.2)

## 2024-02-14 LAB — CULTURE, BLOOD (ROUTINE X 2)
Culture: NO GROWTH
Culture: NO GROWTH
Special Requests: ADEQUATE
Special Requests: ADEQUATE

## 2024-02-14 LAB — GLUCOSE, CAPILLARY
Glucose-Capillary: 92 mg/dL (ref 70–99)
Glucose-Capillary: 92 mg/dL (ref 70–99)
Glucose-Capillary: 115 mg/dL — ABNORMAL HIGH (ref 70–99)
Glucose-Capillary: 97 mg/dL (ref 70–99)

## 2024-02-14 MED ORDER — DARBEPOETIN ALFA 100 MCG/0.5ML IJ SOSY
100.0000 ug | PREFILLED_SYRINGE | Freq: Once | INTRAMUSCULAR | Status: AC
  Administered 2024-02-14: 100 ug via SUBCUTANEOUS
  Filled 2024-02-14: qty 0.5

## 2024-02-14 MED ORDER — OXYCODONE HCL 5 MG PO TABS
10.0000 mg | ORAL_TABLET | Freq: Four times a day (QID) | ORAL | Status: DC | PRN
  Administered 2024-02-14 – 2024-02-15 (×2): 10 mg via ORAL
  Administered 2024-02-16 – 2024-02-26 (×18): 15 mg via ORAL
  Filled 2024-02-14 (×5): qty 3
  Filled 2024-02-14: qty 2
  Filled 2024-02-14 (×3): qty 3
  Filled 2024-02-14: qty 2
  Filled 2024-02-14 (×8): qty 3

## 2024-02-14 NOTE — Progress Notes (Signed)
 PROGRESS NOTE        PATIENT DETAILS Name: Thomas [name] Age: 73 y.o. Sex: male Date of Birth: 06/16/1951 Admit Date: 02/07/2024 Admitting Physician Roxana Copier, DO ZOX:WRUEAV, [name], MD  Brief Summary: Patient is a 73 y.o.  male who with history of ESRD on HD TTS, history of posterior fusion at T10-12 due to osteomyelitis/discitis/epidural abscess in 2021, history of right BKA who presented with low back pain-upon further evaluation with CT imaging-he was found to have potential acute osteomyelitis involving his thoracic spine and admitted to the hospitalist service.  Significant events: 4/20>> admit to [facility]-back pain-concern for thoracic osteomyelitis.  Significant studies: 4/21>> x-ray right elbow: No fracture. 4/21>> x-ray left wrist: No fracture 4/21>> x-ray left shoulder, no fracture. 4/21>> CT head: No acute intracranial abnormality 4/21>> CT C-spine: No fracture. 4/21>> CT abdomen/pelvis: No acute injury to chest/abdomen/pelvis 4/21>> CT T-spine: Prior posterior fusion from T10-T12-abnormal appearance with collapse of T10-lucency around T10 screws at the T9-10 disc space, T10-11 disc space concerning for discitis/osteomyelitis. 4/21>> CT L-spine: No acute bony abnormality, mild chronic compression fracture at L1. 4/22>> x-ray left foot: Chronic erosive changes about the forefoot similar to 11/21/2023-osteomyelitis/septic arthritis of the 1st/2nd MTP joints difficult to exclude. 4/25>> left ABI: Within normal limit.  Significant microbiology data: 4/22>> blood culture: No growth  Procedures: None  Consults: Neurosurgery Infectious disease  Subjective: Reading the Bible this morning-continues to complain of back pain-and muscle cramps.  Objective: Vitals: Blood pressure (!) 163/96, pulse (!) 59, temperature 97.8 F (36.6 C), temperature source Oral, resp. rate (!) 21, height 6\' 5"  (1.956 m), weight 97.8 kg, SpO2 100%.   Exam: Gen  Exam:Alert awake-not in any distress HEENT:atraumatic, normocephalic Chest: B/L clear to auscultation anteriorly CVS:S1S2 regular Abdomen:soft non tender, non distended Extremities: Right BKA-left foot ulceration appears unchanged. 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 Per neurosurgery not an operative candidate Per IR-no window to aspirate Plan is for 6 weeks of vancomycin /cefepime -EOT 6/4 Blood cultures negative so far  Possible osteomyelitis of left foot involving 1st/2nd MTP joints. Chronic left second toe ulceration Intolerant to MRI in the past-not keen on getting one He just completed 6 weeks of daptomycin /cefepime  on 3/28-has refused amputation.  Concern is that he may have seeded his spine through foot infection. He is now open to amputation-ABI stable-will discuss with Dr. Julio Ohm on 4/28.  Mechanical fall Numerous imaging studies negative for fracture PT/OT eval.  ESRD on HD TTS Nephrology following and directing HD care  Hyperkalemia Resolved with HD.  Normocytic anemia Secondary to ESRD Aranesp /iron  deferred to nephrology service.  Chronic HFpEF Euvolemic  on exam Fluid removal with HD  HTN BP stable Clonidine /amlodipine /losartan   HLD Statin  Hypothyroidism Synthroid   BPH Flomax /bethanechol   History of right BKA  BMI: Estimated body mass index is 25.57 kg/m as calculated from the following:   Height as of this  encounter: 6\' 5"  (1.956 m).   Weight as of this encounter: 97.8 kg.   Code status:   Code Status: Full Code   DVT Prophylaxis: heparin  injection 5,000 Units Start: 02/10/24 1400 SCDs Start: 02/08/24 2206   Family Communication: None at bedside   Disposition Plan: Status is: Inpatient Remains inpatient appropriate because: Severity of illness   Planned Discharge Destination:Home health   Diet: Diet Order             Diet regular Room service appropriate? Yes; Fluid consistency: Thin  Diet effective now                     Antimicrobial agents: Anti-infectives (From admission, onward)    Start     Dose/Rate Route Frequency Ordered Stop   02/11/24 1800  ceFEPIme  (MAXIPIME ) 2 g in sodium chloride  0.9 % 100 mL IVPB        2 g 200 mL/hr over 30 Minutes Intravenous Every T-Th-Sa (Hemodialysis) 02/10/24 1028     02/11/24 1200  vancomycin  (VANCOCIN ) IVPB 1000 mg/200 mL premix        1,000 mg 200 mL/hr over 60 Minutes Intravenous Every T-Th-Sa (Hemodialysis) 02/10/24 1028     02/11/24 0000  ceFEPime  (MAXIPIME ) IVPB        2 g Intravenous Every T-Th-Sa (Hemodialysis) 02/11/24 0747 03/23/24 2359   02/11/24 0000  vancomycin  IVPB        1,000 mg Intravenous Every T-Th-Sa (Hemodialysis) 02/11/24 0747 03/23/24 2359   02/10/24 1115  vancomycin  (VANCOREADY) IVPB 2000 mg/400 mL        2,000 mg 200 mL/hr over 120 Minutes Intravenous  Once 02/10/24 1028 02/10/24 1446   02/10/24 1115  ceFEPIme  (MAXIPIME ) 2 g in sodium chloride  0.9 % 100 mL IVPB        2 g 200 mL/hr over 30 Minutes Intravenous  Once 02/10/24 1028 02/10/24 1239        MEDICATIONS: Scheduled Meds:  amLODipine   10 mg Oral Daily   atorvastatin   80 mg Oral Daily   bethanechol   10 mg Oral BID   Chlorhexidine  Gluconate Cloth  6 each Topical Q0600   cloNIDine   0.1 mg Oral TID   darbepoetin (ARANESP ) injection - DIALYSIS  100 mcg Subcutaneous Q Sat-1800   heparin  injection (subcutaneous)  5,000 Units Subcutaneous  Q8H   hydrALAZINE   25 mg Oral TID   leptospermum manuka honey  1 Application Topical Daily   levothyroxine   50 mcg Oral QAC breakfast   losartan   50 mg Oral Daily   metoprolol  tartrate  50 mg Oral BID   polyethylene glycol  17 g Oral BID   senna-docusate  4 tablet Oral BID   sevelamer  carbonate  1,600 mg Oral TID WC   tamsulosin   0.4 mg Oral Daily   Continuous Infusions:  ceFEPime  (MAXIPIME ) IV 2 g (02/13/24 1853)   vancomycin  Stopped (02/13/24 1757)   PRN Meds:.acetaminophen  **OR** acetaminophen , HYDROmorphone  (DILAUDID ) injection, melatonin, naLOXone  (NARCAN )  injection, ondansetron  (ZOFRAN ) IV, oxyCODONE , tiZANidine    I have personally reviewed following labs and imaging studies  LABORATORY DATA: CBC: Recent Labs  Lab 02/08/24 0639 02/08/24 0643 02/09/24 0414 02/11/24 0424 02/14/24 0612  WBC 5.3  --  5.6  5.5 6.0  NEUTROABS  --   --  3.8  --   --   HGB 10.0* 9.9* 10.3* 9.0* 10.2*  HCT 32.1* 29.0* 32.5* 29.0* 31.4*  MCV 109.6*  --  108.0* 107.8* 105.4*  PLT 151  --  169 147* 142*    Basic Metabolic Panel: Recent Labs  Lab 02/08/24 0639 02/08/24 0643 02/09/24 0406 02/11/24 0424 02/12/24 0505 02/13/24 1343  NA 139 138 138 136 133* 136  K 4.8 4.8 5.7* 5.2* 4.8 4.6  CL 99 103 101 99 98 99  CO2 24  --  21* 21* 21* 22  GLUCOSE 80 76 58* 94 98 125*  BUN 41* 39* 47* 47* 34* 57*  CREATININE 7.79* 8.40* 8.57* 7.62* 5.70* 7.31*  CALCIUM  8.1*  --  8.0* 7.7* 8.1* 8.1*  MG  --   --  2.4  --   --   --   PHOS  --   --  8.2* 6.8* 5.1*  --     GFR: Estimated Creatinine Clearance: 11.9 mL/min (A) (by C-G formula based on SCr of 7.31 mg/dL (H)).  Liver Function Tests: Recent Labs  Lab 02/08/24 0639 02/09/24 0406 02/11/24 0424 02/12/24 0505  AST 22 20  --   --   ALT 9 8  --   --   ALKPHOS 45 50  --   --   BILITOT 0.7 0.9  --   --   PROT 8.6* 8.2*  --   --   ALBUMIN  3.5 3.4* 3.0* 3.4*   No results for input(s): "LIPASE", "AMYLASE" in the last 168 hours. No  results for input(s): "AMMONIA" in the last 168 hours.  Coagulation Profile: Recent Labs  Lab 02/08/24 0639 02/09/24 1931  INR 1.1 1.1    Cardiac Enzymes: No results for input(s): "CKTOTAL", "CKMB", "CKMBINDEX", "TROPONINI" in the last 168 hours.  BNP (last 3 results) No results for input(s): "PROBNP" in the last 8760 hours.  Lipid Profile: No results for input(s): "CHOL", "HDL", "LDLCALC", "TRIG", "CHOLHDL", "LDLDIRECT" in the last 72 hours.  Thyroid Function Tests: No results for input(s): "TSH", "T4TOTAL", "FREET4", "T3FREE", "THYROIDAB" in the last 72 hours.  Anemia Panel: No results for input(s): "VITAMINB12", "FOLATE", "FERRITIN", "TIBC", "IRON ", "RETICCTPCT" in the last 72 hours.   Urine analysis:    Component Value Date/Time   COLORURINE YELLOW 01/14/2022 0248   APPEARANCEUR HAZY (A) 01/14/2022 0248   LABSPEC 1.009 01/14/2022 0248   PHURINE 7.0 01/14/2022 0248   GLUCOSEU NEGATIVE 01/14/2022 0248   HGBUR MODERATE (A) 01/14/2022 0248   BILIRUBINUR NEGATIVE 01/14/2022 0248   KETONESUR NEGATIVE 01/14/2022 0248   PROTEINUR 100 (A) 01/14/2022 0248   NITRITE NEGATIVE 01/14/2022 0248   LEUKOCYTESUR LARGE (A) 01/14/2022 0248    Sepsis Labs: Lactic Acid, Venous    Component Value Date/Time   LATICACIDVEN 1.0 12/31/2021 0048    MICROBIOLOGY: Recent Results (from the past 240 hours)  Culture, blood (Routine X 2) w Reflex to ID Panel     Status: None   Collection Time: 02/09/24  4:06 AM   Specimen: BLOOD  Result Value Ref Range Status   Specimen Description BLOOD BLOOD RIGHT HAND  Final   Special Requests   Final    BOTTLES DRAWN AEROBIC ONLY Blood Culture adequate volume   Culture   Final    NO GROWTH 5 DAYS Performed at [facility], [address], [place], [place] [address]    Report Status 02/14/2024 FINAL  Final  Culture,  blood (Routine X 2) w Reflex to ID Panel     Status: None   Collection Time: 02/09/24  4:14 AM   Specimen: BLOOD  Result  Value Ref Range Status   Specimen Description BLOOD BLOOD RIGHT HAND AEROBIC BOTTLE ONLY  Final   Special Requests   Final    BOTTLES DRAWN AEROBIC ONLY Blood Culture adequate volume   Culture   Final    NO GROWTH 5 DAYS Performed at [facility], [address] [place], [place] [address]    Report Status 02/14/2024 FINAL  Final    RADIOLOGY STUDIES/RESULTS: VAS US  ABI WITH/WO TBI Result Date: 02/12/2024  LOWER EXTREMITY DOPPLER STUDY Patient Name:  Thomas [name]  Date of Exam:   02/12/2024 Medical Rec #: [id number]        Accession #:    [id number] Date of Birth: 27-May-1951        Patient Gender: M Patient Age:   36 years Exam Location:  [facility] Procedure:      VAS US  ABI WITH/WO TBI Referring Phys: Kimberly Penna --------------------------------------------------------------------------------  Indications: Ulceration. High Risk Factors: Hypertension.  Performing Technologist: Estanislao Heimlich  Examination Guidelines: A complete evaluation includes at minimum, Doppler waveform signals and systolic blood pressure reading at the level of bilateral brachial, anterior tibial, and posterior tibial arteries, when vessel segments are accessible. Bilateral testing is considered an integral part of a complete examination. Photoelectric Plethysmograph (PPG) waveforms and toe systolic pressure readings are included as required and additional duplex testing as needed. Limited examinations for reoccurring indications may be performed as noted.  ABI Findings: +---------+------------------+-----+---------+--------+ Right    Rt Pressure (mmHg)IndexWaveform Comment  +---------+------------------+-----+---------+--------+ Brachial 113                    triphasic         +---------+------------------+-----+---------+--------+ PTA                                      BKA      +---------+------------------+-----+---------+--------+ PERO                                     BKA       +---------+------------------+-----+---------+--------+ Great Toe                                BKA      +---------+------------------+-----+---------+--------+ +---------+------------------+-----+---------+-------+ Left     Lt Pressure (mmHg)IndexWaveform Comment +---------+------------------+-----+---------+-------+ Brachial                                 AVF     +---------+------------------+-----+---------+-------+ PTA      154               1.36 triphasic        +---------+------------------+-----+---------+-------+ DP       123               1.09 triphasic        +---------+------------------+-----+---------+-------+ Great Toe201               1.78 Normal           +---------+------------------+-----+---------+-------+  Summary: Left: Resting left ankle-brachial index  is within normal range. The left toe-brachial index is normal. *See table(s) above for measurements and observations.  Electronically signed by Jimmye Moulds MD on 02/12/2024 at 4:41:43 PM.    Final       LOS: 6 days   Kimberly Penna, MD  [facility]    To contact the attending provider between 7A-7P or the covering provider during after hours 7P-7A, please log into the web site [email or web address] and access using universal [place] password for that web site. If you do not have the password, please call the hospital operator.  02/14/2024, 10:43 AM

## 2024-02-14 NOTE — Plan of Care (Signed)
 Problem: Education: Goal: Ability to describe self-care measures that may prevent or decrease complications (Diabetes Survival Skills Education) will improve 02/14/2024 1626 by Meridith Stanford, RN Outcome: Progressing 02/14/2024 1610 by Meridith Stanford, RN Outcome: Progressing Goal: Individualized Educational Video(s) 02/14/2024 1626 by Meridith Stanford, RN Outcome: Progressing 02/14/2024 1610 by Meridith Stanford, RN Outcome: Progressing   Problem: Coping: Goal: Ability to adjust to condition or change in health will improve 02/14/2024 1626 by Meridith Stanford, RN Outcome: Progressing 02/14/2024 1610 by Meridith Stanford, RN Outcome: Progressing   Problem: Fluid Volume: Goal: Ability to maintain a balanced intake and output will improve 02/14/2024 1626 by Meridith Stanford, RN Outcome: Progressing 02/14/2024 1610 by Meridith Stanford, RN Outcome: Progressing   Problem: Health Behavior/Discharge Planning: Goal: Ability to identify and utilize available resources and services will improve 02/14/2024 1626 by Meridith Stanford, RN Outcome: Progressing 02/14/2024 1610 by Meridith Stanford, RN Outcome: Progressing Goal: Ability to manage health-related needs will improve 02/14/2024 1626 by Meridith Stanford, RN Outcome: Progressing 02/14/2024 1610 by Meridith Stanford, RN Outcome: Progressing   Problem: Metabolic: Goal: Ability to maintain appropriate glucose levels will improve 02/14/2024 1626 by Meridith Stanford, RN Outcome: Progressing 02/14/2024 1610 by Meridith Stanford, RN Outcome: Progressing   Problem: Nutritional: Goal: Maintenance of adequate nutrition will improve 02/14/2024 1626 by Meridith Stanford, RN Outcome: Progressing 02/14/2024 1610 by Meridith Stanford, RN Outcome: Progressing Goal: Progress toward achieving an optimal weight will improve 02/14/2024 1626 by Meridith Stanford, RN Outcome: Progressing 02/14/2024 1610 by  Meridith Stanford, RN Outcome: Progressing   Problem: Skin Integrity: Goal: Risk for impaired skin integrity will decrease 02/14/2024 1626 by Meridith Stanford, RN Outcome: Progressing 02/14/2024 1610 by Meridith Stanford, RN Outcome: Progressing   Problem: Tissue Perfusion: Goal: Adequacy of tissue perfusion will improve 02/14/2024 1626 by Meridith Stanford, RN Outcome: Progressing 02/14/2024 1610 by Meridith Stanford, RN Outcome: Progressing   Problem: Education: Goal: Knowledge of General Education information will improve Description: Including pain rating scale, medication(s)/side effects and non-pharmacologic comfort measures 02/14/2024 1626 by Meridith Stanford, RN Outcome: Progressing 02/14/2024 1610 by Meridith Stanford, RN Outcome: Progressing   Problem: Health Behavior/Discharge Planning: Goal: Ability to manage health-related needs will improve 02/14/2024 1626 by Meridith Stanford, RN Outcome: Progressing 02/14/2024 1610 by Meridith Stanford, RN Outcome: Progressing   Problem: Clinical Measurements: Goal: Ability to maintain clinical measurements within normal limits will improve 02/14/2024 1626 by Meridith Stanford, RN Outcome: Progressing 02/14/2024 1610 by Meridith Stanford, RN Outcome: Progressing Goal: Will remain free from infection 02/14/2024 1626 by Meridith Stanford, RN Outcome: Progressing 02/14/2024 1610 by Meridith Stanford, RN Outcome: Progressing Goal: Diagnostic test results will improve 02/14/2024 1626 by Meridith Stanford, RN Outcome: Progressing 02/14/2024 1610 by Meridith Stanford, RN Outcome: Progressing Goal: Respiratory complications will improve 02/14/2024 1626 by Meridith Stanford, RN Outcome: Progressing 02/14/2024 1610 by Meridith Stanford, RN Outcome: Progressing Goal: Cardiovascular complication will be avoided 02/14/2024 1626 by Meridith Stanford, RN Outcome: Progressing 02/14/2024 1610 by Meridith Stanford, RN Outcome: Progressing   Problem: Activity: Goal: Risk for activity intolerance will decrease 02/14/2024 1626 by Meridith Stanford, RN Outcome: Progressing 02/14/2024 1610 by Meridith Stanford, RN Outcome: Progressing   Problem: Nutrition: Goal: Adequate nutrition will be maintained 02/14/2024 1626 by Meridith Stanford, RN Outcome: Progressing 02/14/2024 1610 by Meridith Stanford, RN Outcome: Progressing  Problem: Coping: Goal: Level of anxiety will decrease 02/14/2024 1626 by Meridith Stanford, RN Outcome: Progressing 02/14/2024 1610 by Meridith Stanford, RN Outcome: Progressing   Problem: Elimination: Goal: Will not experience complications related to bowel motility 02/14/2024 1626 by Meridith Stanford, RN Outcome: Progressing 02/14/2024 1610 by Meridith Stanford, RN Outcome: Progressing Goal: Will not experience complications related to urinary retention 02/14/2024 1626 by Meridith Stanford, RN Outcome: Progressing 02/14/2024 1610 by Meridith Stanford, RN Outcome: Progressing   Problem: Safety: Goal: Ability to remain free from injury will improve 02/14/2024 1626 by Meridith Stanford, RN Outcome: Progressing 02/14/2024 1610 by Meridith Stanford, RN Outcome: Progressing   Problem: Skin Integrity: Goal: Risk for impaired skin integrity will decrease 02/14/2024 1626 by Meridith Stanford, RN Outcome: Progressing 02/14/2024 1610 by Meridith Stanford, RN Outcome: Progressing   Problem: Pain Managment: Goal: General experience of comfort will improve and/or be controlled 02/14/2024 1626 by Meridith Stanford, RN Outcome: Not Progressing 02/14/2024 1610 by Meridith Stanford, RN Outcome: Progressing

## 2024-02-14 NOTE — Progress Notes (Signed)
 [facility] Progress Note   Subjective: Seen in room. Barely able to stay awake to talk but has been refusing PO pain meds, requesting dilaudid . NAD. HD yesterday NET UF 800 ml      Objective Vitals:   02/13/24 2136 02/13/24 2316 02/14/24 0500 02/14/24 0817  BP: (!) 150/103 137/81  (!) 163/96  Pulse: 70 67  (!) 59  Resp:  13  (!) 21  Temp:  97.8 F (36.6 C)  97.8 F (36.6 C)  TempSrc:  Oral  Oral  SpO2:    100%  Weight:   97.8 kg   Height:        Additional Objective Labs: Basic Metabolic Panel: Recent Labs  Lab 02/09/24 0406 02/11/24 0424 02/12/24 0505 02/13/24 1343  NA 138 136 133* 136  K 5.7* 5.2* 4.8 4.6  CL 101 99 98 99  CO2 21* 21* 21* 22  GLUCOSE 58* 94 98 125*  BUN 47* 47* 34* 57*  CREATININE 8.57* 7.62* 5.70* 7.31*  CALCIUM  8.0* 7.7* 8.1* 8.1*  PHOS 8.2* 6.8* 5.1*  --    Liver Function Tests: Recent Labs  Lab 02/08/24 0639 02/09/24 0406 02/11/24 0424 02/12/24 0505  AST 22 20  --   --   ALT 9 8  --   --   ALKPHOS 45 50  --   --   BILITOT 0.7 0.9  --   --   PROT 8.6* 8.2*  --   --   ALBUMIN  3.5 3.4* 3.0* 3.4*   No results for input(s): "LIPASE", "AMYLASE" in the last 168 hours. CBC: Recent Labs  Lab 02/08/24 0639 02/08/24 0643 02/09/24 0414 02/11/24 0424 02/14/24 0612  WBC 5.3  --  5.6 5.5 6.0  NEUTROABS  --   --  3.8  --   --   HGB 10.0*   < > 10.3* 9.0* 10.2*  HCT 32.1*   < > 32.5* 29.0* 31.4*  MCV 109.6*  --  108.0* 107.8* 105.4*  PLT 151  --  169 147* 142*   < > = values in this interval not displayed.   Blood Culture    Component Value Date/Time   SDES BLOOD BLOOD RIGHT HAND AEROBIC BOTTLE ONLY 02/09/2024 0414   SPECREQUEST  02/09/2024 0414    BOTTLES DRAWN AEROBIC ONLY Blood Culture adequate volume   CULT  02/09/2024 0414    NO GROWTH 5 DAYS Performed at [facility], [address], [place], [place] [address]    REPTSTATUS 02/14/2024 FINAL 02/09/2024 0414    Cardiac Enzymes: No results for  input(s): "CKTOTAL", "CKMB", "CKMBINDEX", "TROPONINI" in the last 168 hours. CBG: Recent Labs  Lab 02/13/24 0743 02/13/24 1059 02/13/24 2105 02/14/24 0810 02/14/24 1113  GLUCAP 94 109* 126* 92 92   Iron  Studies: No results for input(s): "IRON ", "TIBC", "TRANSFERRIN", "FERRITIN" in the last 72 hours. @lablastinr3 @ Studies/Results: VAS US  ABI WITH/WO TBI Result Date: 02/12/2024  LOWER EXTREMITY DOPPLER STUDY Patient Name:  JAIVEER TRABERT  Date of Exam:   02/12/2024 Medical Rec #: [id number]        Accession #:    [id number] Date of Birth: Jan 08, 1951        Patient Gender: M Patient Age:   73 years Exam Location:  [facility] Procedure:      VAS US  ABI WITH/WO TBI Referring Phys: Kimberly Penna --------------------------------------------------------------------------------  Indications: Ulceration. High Risk Factors: Hypertension.  Performing Technologist: Estanislao Heimlich  Examination Guidelines: A complete evaluation includes at minimum,  Doppler waveform signals and systolic blood pressure reading at the level of bilateral brachial, anterior tibial, and posterior tibial arteries, when vessel segments are accessible. Bilateral testing is considered an integral part of a complete examination. Photoelectric Plethysmograph (PPG) waveforms and toe systolic pressure readings are included as required and additional duplex testing as needed. Limited examinations for reoccurring indications may be performed as noted.  ABI Findings: +---------+------------------+-----+---------+--------+ Right    Rt Pressure (mmHg)IndexWaveform Comment  +---------+------------------+-----+---------+--------+ Brachial 113                    triphasic         +---------+------------------+-----+---------+--------+ PTA                                      BKA      +---------+------------------+-----+---------+--------+ PERO                                     BKA       +---------+------------------+-----+---------+--------+ Great Toe                                BKA      +---------+------------------+-----+---------+--------+ +---------+------------------+-----+---------+-------+ Left     Lt Pressure (mmHg)IndexWaveform Comment +---------+------------------+-----+---------+-------+ Brachial                                 AVF     +---------+------------------+-----+---------+-------+ PTA      154               1.36 triphasic        +---------+------------------+-----+---------+-------+ DP       123               1.09 triphasic        +---------+------------------+-----+---------+-------+ Great Toe201               1.78 Normal           +---------+------------------+-----+---------+-------+  Summary: Left: Resting left ankle-brachial index is within normal range. The left toe-brachial index is normal. *See table(s) above for measurements and observations.  Electronically signed by Jimmye Moulds MD on 02/12/2024 at 4:41:43 PM.    Final    Medications:  ceFEPime  (MAXIPIME ) IV 2 g (02/13/24 1853)   vancomycin  Stopped (02/13/24 1757)    amLODipine   10 mg Oral Daily   atorvastatin   80 mg Oral Daily   bethanechol   10 mg Oral BID   Chlorhexidine  Gluconate Cloth  6 each Topical Q0600   cloNIDine   0.1 mg Oral TID   darbepoetin (ARANESP ) injection - DIALYSIS  100 mcg Subcutaneous Q Sat-1800   heparin  injection (subcutaneous)  5,000 Units Subcutaneous Q8H   hydrALAZINE   25 mg Oral TID   leptospermum manuka honey  1 Application Topical Daily   levothyroxine   50 mcg Oral QAC breakfast   losartan   50 mg Oral Daily   metoprolol  tartrate  50 mg Oral BID   polyethylene glycol  17 g Oral BID   senna-docusate  4 tablet Oral BID   sevelamer  carbonate  1,600 mg Oral TID WC   tamsulosin   0.4 mg Oral Daily   Physical Exam General:Chronically ill appearing male appearing man, NAD. Room air Heart: RRR;  2/6 murmur Lungs: CTAB Abdomen:  soft Extremities: R BKA, no LLE edema, dry scabbed skin and bandaged toe Dialysis Access: TDC drsg intact  Dialysis Orders TTS -  4:15hr, 400/A1.5, EDW 92kg, 2K/2.5Ca, TDC, no heparin  - Mircera 225mcg IV q 2 weeks (last 4/9) - Calcitriol  0.98mcg PO q HD   Assessment/Plan:  Thoracic discitis/OM: Recent acute on chronic back pain, s/p fall on 4/20. CT imaging showed T9-10 discitis. He refused MRI, area too small for aspiration per IR. Just finished 8 week course of Dapto/Cefepime  on 3/20 for bacteremia presumed due to foot infection. Concern infection may have seeded in spine. Now agreeing to amputation. Ortho consult per primary.. Now started on Vanc/Cefepime  for 6 week course for discitis.   ESRD:  Usual TTS schedule - next HD 02/16/2024.  Hypertension/volume: BP ok, no edema. Appears euvolemic, low UF goal next HD d/t severe cramping with last.  Anemia: HGB 10.2. Follow labs. On weekly ESA. No venofer in setting of infection..  Metabolic bone disease: Ca ok, Phos high - resumed home binders.  Nutrition: Alb 3.4, follow.  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  Haifa Hatton H. [place] Yearby NP-C 02/14/2024, 11:46 AM  [employer] [phone]

## 2024-02-14 NOTE — Progress Notes (Signed)
 Patient refusing oxycodone /roxicodone  pill at this time for pain. Patient requesting dilaudid  only every 2 hours. Stated that the pain pill does nothing.

## 2024-02-14 NOTE — Plan of Care (Signed)
 Progressing

## 2024-02-15 DIAGNOSIS — M86272 Subacute osteomyelitis, left ankle and foot: Secondary | ICD-10-CM | POA: Diagnosis not present

## 2024-02-15 DIAGNOSIS — M4624 Osteomyelitis of vertebra, thoracic region: Secondary | ICD-10-CM | POA: Diagnosis not present

## 2024-02-15 DIAGNOSIS — E039 Hypothyroidism, unspecified: Secondary | ICD-10-CM | POA: Diagnosis not present

## 2024-02-15 DIAGNOSIS — E119 Type 2 diabetes mellitus without complications: Secondary | ICD-10-CM | POA: Diagnosis not present

## 2024-02-15 DIAGNOSIS — I10 Essential (primary) hypertension: Secondary | ICD-10-CM | POA: Diagnosis not present

## 2024-02-15 LAB — GLUCOSE, CAPILLARY
Glucose-Capillary: 71 mg/dL (ref 70–99)
Glucose-Capillary: 152 mg/dL — ABNORMAL HIGH (ref 70–99)
Glucose-Capillary: 94 mg/dL (ref 70–99)
Glucose-Capillary: 113 mg/dL — ABNORMAL HIGH (ref 70–99)

## 2024-02-15 LAB — URIC ACID: Uric Acid, Serum: 5.1 mg/dL (ref 3.7–8.6)

## 2024-02-15 MED ORDER — CHLORHEXIDINE GLUCONATE CLOTH 2 % EX PADS
6.0000 | MEDICATED_PAD | Freq: Every day | CUTANEOUS | Status: DC
Start: 2024-02-16 — End: 2024-02-19
  Administered 2024-02-16 – 2024-02-18 (×3): 6 via TOPICAL

## 2024-02-15 NOTE — Progress Notes (Signed)
 [facility] Progress Note   Subjective:  Seen in room alert and interactive today Chronic SOB, o/w no c/o's   Objective Vitals:   02/15/24 0806 02/15/24 0807 02/15/24 1105 02/15/24 1200  BP: (!) 154/81 (!) 154/81 (!) 154/81 110/61  Pulse:  (!) 56 74 68  Resp:   15 14  Temp:      TempSrc:      SpO2:   95% 91%  Weight:      Height:        Basic Metabolic Panel: Recent Labs  Lab 02/09/24 0406 02/11/24 0424 02/12/24 0505 02/13/24 1343  NA 138 136 133* 136  K 5.7* 5.2* 4.8 4.6  CL 101 99 98 99  CO2 21* 21* 21* 22  GLUCOSE 58* 94 98 125*  BUN 47* 47* 34* 57*  CREATININE 8.57* 7.62* 5.70* 7.31*  CALCIUM  8.0* 7.7* 8.1* 8.1*  PHOS 8.2* 6.8* 5.1*  --     Recent Labs  Lab 02/09/24 0414 02/11/24 0424 02/14/24 0612  WBC 5.6 5.5 6.0  NEUTROABS 3.8  --   --   HGB 10.3* 9.0* 10.2*  HCT 32.5* 29.0* 31.4*  MCV 108.0* 107.8* 105.4*  PLT 169 147* 142*    Medications:  ceFEPime  (MAXIPIME ) IV 2 g (02/13/24 1853)   vancomycin  Stopped (02/13/24 1757)    amLODipine   10 mg Oral Daily   atorvastatin   80 mg Oral Daily   bethanechol   10 mg Oral BID   Chlorhexidine  Gluconate Cloth  6 each Topical Q0600   cloNIDine   0.1 mg Oral TID   darbepoetin (ARANESP ) injection - DIALYSIS  100 mcg Subcutaneous Q Sat-1800   heparin  injection (subcutaneous)  5,000 Units Subcutaneous Q8H   hydrALAZINE   25 mg Oral TID   leptospermum manuka honey  1 Application Topical Daily   levothyroxine   50 mcg Oral QAC breakfast   losartan   50 mg Oral Daily   metoprolol  tartrate  50 mg Oral BID   polyethylene glycol  17 g Oral BID   senna-docusate  4 tablet Oral BID   sevelamer  carbonate  1,600 mg Oral TID WC   tamsulosin   0.4 mg Oral Daily   Physical Exam General:Chronically ill appearing male, NAD. Room air Heart: RRR; 2/6 murmur Lungs: CTAB Abdomen: soft, ntnd Extremities: R BKA, no LLE edema, dry scabbed skin and bandaged toe Dialysis Access: TDC drsg intact  Dialysis  Orders TTS [place] 4:15hr, 400/A1.5, EDW 92kg, 2K/2.5Ca, TDC, no heparin  - Mircera 225mcg IV q 2 weeks (last 4/9) - Calcitriol  0.56mcg PO q HD   Assessment/Plan:  Thoracic discitis/OM: recent acute on chronic back pain, s/p fall on 4/20. CT imaging showed T9-10 discitis. He refused MRI, area too small for aspiration per IR. Finished 8 week course of Dapto/Cefepime  on 3/20 for bacteremia presumed due to foot infection. Concern infection may have seeded in spine. Now agreeing to amputation. Ortho consult per primary. Now started on Vanc/Cefepime  for 6 week course for discitis. Blood cx's were negative.   ESRD:  Usual TTS schedule. Next HD tomorrow.  Hypertension/volume: BP ok, no edema. Appears euvolemic, low UF goal next HD d/t severe cramping with last. Looks dry on exam. RA.   Anemia: HGB 10.2. Follow labs. On weekly ESA. No venofer in setting of infection.  Metabolic bone disease: Ca ok, Phos high - resumed home binders.  Nutrition: Alb 3.4, follow.  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  [name]  MD  [facility] 02/15/2024, 4:17 PM  Recent Labs  Lab  02/11/24 0424 02/12/24 0505 02/13/24 1343 02/14/24 0612  HGB 9.0*  --   --  10.2*  ALBUMIN  3.0* 3.4*  --   --   CALCIUM  7.7* 8.1* 8.1*  --   PHOS 6.8* 5.1*  --   --   CREATININE 7.62* 5.70* 7.31*  --   K 5.2* 4.8 4.6  --

## 2024-02-15 NOTE — Consult Note (Signed)
 ORTHOPAEDIC CONSULTATION  REQUESTING PHYSICIAN: Burton Casey, MD  Chief Complaint: Pain swelling and drainage toes left foot.  HPI: Thomas [name] is a 73 y.o. male who presents with ulcerations swelling and drainage from the toes of the left foot.  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  Patient has persistent gout symptoms and ulcers with drainage in the left foot.  Patient is end-stage renal disease on dialysis.  Past Medical History:  Diagnosis Date   Arthritis    BPH (benign prostatic hyperplasia) 11/24/2019   Chronic pain syndrome 11/24/2019   Diabetes mellitus without complication (HCC)    ESRD (end stage renal disease) (HCC)    on HD   Gout    Hypertension    Muscle pain    Physical deconditioning 11/24/2019   Scars    Swelling    Past Surgical History:  Procedure Laterality Date   BELOW KNEE LEG AMPUTATION Right    ESOPHAGOGASTRODUODENOSCOPY (EGD) WITH PROPOFOL  N/A 11/25/2019   Procedure: ESOPHAGOGASTRODUODENOSCOPY (EGD) WITH PROPOFOL ;  Surgeon: Alvis Jourdain, MD;  Location: [facility] ENDOSCOPY;  Service: Endoscopy;  Laterality: N/A;   IR FLUORO GUIDE CV LINE RIGHT  09/05/2019   IR LUMBAR DISC ASPIRATION W/IMG GUIDE  11/26/2019   IR US  GUIDE VASC ACCESS RIGHT  09/05/2019   RADIOLOGY WITH ANESTHESIA N/A 11/25/2019   Procedure: Thoracic and Lumbar MRI WITH ANESTHESIA;  Surgeon: Radiologist, Medication, MD;  Location: [facility] OR;  Service: Radiology;  Laterality: N/A;   Social History   Socioeconomic History   Marital status: Single    Spouse name: Not on file   Number of children: Not on file   Years of education: Not on file   Highest education level: Not on file  Occupational History   Not on file  Tobacco Use   Smoking status: Never    Passive exposure: Never   Smokeless tobacco: Never  Vaping Use   Vaping status: Never Used  Substance and Sexual Activity   Alcohol use: No   Drug  use: No   Sexual activity: Not Currently  Other Topics Concern   Not on file  Social History Narrative   Not on file   Social Drivers of Health   Financial Resource Strain: Not on file  Food Insecurity: No Food Insecurity (02/09/2024)   Hunger Vital Sign    Worried About Running Out of Food in the Last Year: Never true    Ran Out of Food in the Last Year: Never true  Transportation Needs: No Transportation Needs (02/09/2024)   PRAPARE - Administrator, Civil Service (Medical): No    Lack of Transportation (Non-Medical): No  Physical Activity: Not on file  Stress: Not on file  Social Connections: Socially Isolated (02/11/2024)   Social Connection and Isolation Panel [NHANES]    Frequency of Communication with Friends and Family: More than three times a week    Frequency of Social Gatherings with Friends and Family: Once a week    Attends Religious Services: Never    Database administrator or Organizations: No    Attends Engineer, structural: Never    Marital Status: Never married   Family History  Problem Relation Age of Onset   Heart disease Mother    Cancer Father    - negative except otherwise stated in the family history section Allergies  Allergen Reactions   Other Other (See Comments)  Blood pressure issues Per [facility] hospital: Pt states he can only take these pain meds or else he gets very sick: Oxycontin ,morphine ,demerol, and dilaudid  are the only pain meds pt states he can take.     Oxymorphone Other (See Comments)    Causes kidney problems   Amitriptyline Other (See Comments)   Aspirin Nausea And Vomiting    Gi upset, has kidney issues   Beta Vulgaris Nausea And Vomiting   Buspirone Other (See Comments)    Affected his head    Cabbage Nausea And Vomiting   Codeine Other (See Comments)    Pt does not remember the reaction but knows he can not take    Cyclobenzaprine Nausea And Vomiting   Diflunisal Nausea And Vomiting and Other (See  Comments)   Fish Allergy Nausea And Vomiting   Fish-Derived Products Nausea And Vomiting   Hydroxyzine Nausea And Vomiting   Levetiracetam Other (See Comments)   Methadone Other (See Comments)    Made him loopy   Nalbuphine Other (See Comments)    Cutaneous eruption (morphologic abnormality)   Pentazocine Other (See Comments)    Pt does not remember the reaction    Propoxyphene Other (See Comments)    Pt does not remember the reaction     Shellfish Allergy Nausea And Vomiting   Sulfa Antibiotics Hives   Sulfasalazine Other (See Comments)    Pt does not remember the reaction   Amoxicillin  Nausea And Vomiting and Rash    Per [facility]   Prior to Admission medications   Medication Sig Start Date End Date Taking? Authorizing Provider  amLODipine  (NORVASC ) 10 MG tablet Take 10 mg by mouth daily. 01/25/24  Yes [provider]  atorvastatin  (LIPITOR ) 80 MG tablet Take 80 mg by mouth daily.   Yes [provider]  bethanechol  (URECHOLINE ) 10 MG tablet Take 10 mg by mouth 2 (two) times daily. 12/21/23  Yes [provider]  ceFEPime  (MAXIPIME ) IVPB Inject 2 g into the vein Every Tuesday,Thursday,and Saturday with dialysis. Indication:  Discitis/osteo Last Day of Therapy:  03/23/24 Labs - Once weekly:  CBC/D and BMP, ESR and CRP Method of administration: Per hemodialysis protocol, to be given at HD center= 02/11/24 03/23/24 Yes Liane Redman, MD  Cholecalciferol  125 MCG (5000 UT) capsule Take 5,000 Units by mouth daily.   Yes [provider]  cloNIDine  (CATAPRES ) 0.1 MG tablet Take 0.1 mg by mouth 3 (three) times daily as needed.   Yes [provider]  cyanocobalamin  (VITAMIN B12) 1000 MCG tablet Take 1 tablet by mouth daily.   Yes [provider]  hydrALAZINE  (APRESOLINE ) 25 MG tablet Take 25 mg by mouth 3 (three) times daily.   Yes [provider]  levothyroxine  (SYNTHROID ) 50 MCG tablet Take 50 mcg by mouth daily before  breakfast.    Yes [provider]  losartan  (COZAAR ) 50 MG tablet Take 50 mg by mouth at bedtime. 11/11/23  Yes [provider]  magnesium  oxide (MAG-OX) 400 MG tablet Take 800 mg by mouth in the morning, at noon, and at bedtime.   Yes [provider]  metoprolol  tartrate (LOPRESSOR ) 50 MG tablet Take 1 tablet (50 mg total) by mouth 2 (two) times daily. Patient taking differently: Take 50 mg by mouth 2 (two) times daily as needed (Hypertension). 12/24/21  Yes Samtani, Jai-Gurmukh, MD  OXYCONTIN  30 MG 12 hr tablet Take 30 mg by mouth 3 (three) times daily as needed (pain). 12/30/21  Yes [provider]  senna-docusate (SENOKOT-S) 8.6-50 MG tablet Take 4 tablets by mouth 2 (two) times daily. 11/04/22  Yes [provider]  sevelamer  (RENAGEL ) 800 MG tablet Take 1,600 mg by mouth 3 (three) times daily. 08/27/23  Yes [provider]  tamsulosin  (FLOMAX ) 0.4 MG CAPS capsule Take 1 capsule (0.4 mg total) by mouth daily. 09/25/20  Yes Avi Body, MD  tiZANidine  (ZANAFLEX ) 4 MG tablet Take 4 mg by mouth every 8 (eight) hours as needed for muscle spasms. 11/26/21  Yes [provider]  vancomycin  IVPB Inject 1,000 mg into the vein Every Tuesday,Thursday,and Saturday with dialysis. Indication:  Discitis/osteo Last Day of Therapy:  03/23/24 Labs - Once weekly:  CBC/D, BMP, and pre-HD Vanc level (goal 15-25 mcg/ml), ESR and CRP Method of administration: Per hemodialysis protocol - to be given at HD center 02/11/24 03/23/24 Yes Liane Redman, MD  Vitamin E 670 MG (1000 UT) CAPS Take 1,000 mg by mouth daily.   Yes [provider]  furosemide  (LASIX ) 40 MG tablet Take 40 mg by mouth daily. 11/26/23   [provider]  warfarin (COUMADIN ) 10 MG tablet Take 10 mg by mouth daily. Patient not taking: Reported on 02/09/2024    [provider]   No results found. - pertinent xrays, CT, MRI studies were reviewed and independently  interpreted  Positive ROS: All other systems have been reviewed and were otherwise negative with the exception of those mentioned in the HPI and as above.  Physical Exam: General: Alert, no acute distress Psychiatric: Patient is competent for consent with normal mood and affect Lymphatic: No axillary or cervical lymphadenopathy Cardiovascular: No pedal edema Respiratory: No cyanosis, no use of accessory musculature GI: No organomegaly, abdomen is soft and non-tender    Images:  @ENCIMAGES @  Labs:  Lab Results  Component Value Date   HGBA1C 4.2 (L) 02/09/2024   HGBA1C 4.8 12/19/2021   HGBA1C 6.0 (H) 09/20/2021   ESRSEDRATE 130 (H) 02/12/2024   ESRSEDRATE 112 (H) 02/09/2024   ESRSEDRATE 110 (H) 12/07/2021   CRP 1.4 (H) 02/09/2024   CRP 1.4 (H) 01/14/2022   CRP 3.0 (H) 12/07/2021   REPTSTATUS 02/14/2024 FINAL 02/09/2024   GRAMSTAIN NO WBC SEEN NO ORGANISMS SEEN  09/04/2020   CULT  02/09/2024    NO GROWTH 5 DAYS Performed at [facility], [address] [place], [place] [address]     Lab Results  Component Value Date   ALBUMIN  3.4 (L) 02/12/2024   ALBUMIN  3.0 (L) 02/11/2024   ALBUMIN  3.4 (L) 02/09/2024   PREALBUMIN 23.8 11/24/2019        Latest Ref Rng & Units 02/14/2024    6:12 AM 02/11/2024    4:24 AM 02/09/2024    4:14 AM  CBC EXTENDED  WBC 4.0 - 10.5 K/uL 6.0  5.5  5.6   RBC 4.22 - 5.81 MIL/uL 2.98  2.69  3.01   Hemoglobin 13.0 - 17.0 g/dL 16.6  9.0  06.3   HCT 01.6 - 52.0 % 31.4  29.0  32.5   Platelets 150 - 400 K/uL 142  147  169   NEUT# 1.7 - 7.7 K/uL   3.8   Lymph# 0.7 - 4.0 K/uL   1.1     Neurologic: Patient does not have protective sensation bilateral lower extremities.   MUSCULOSKELETAL:   Skin: Examination patient has decreased swelling the left foot at this time with wrinkling of the skin.  There is sausage digit swelling of the toes of the left  foot.  Patient has a palpable dorsalis pedis pulse.  Ankle-brachial indices shows good  multiphasic circulation to the ankle.  Radiograph shows destructive bony changes of the toes consistent with chronic osteomyelitis and radiographs through the midfoot shows periarticular cystic changes consistent with chronic gout.  Hemoglobin 10.2 with a white cell count of 6.0.  Sed rate 130 and C-reactive protein 14.  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   Plan: Plan: Will plan for a transmetatarsal amputation on the left.  I will order uric acid.  Thank you for the consult and the opportunity to see Mr. Durl Walthall, MD [facility] [phone] 6:45 AM

## 2024-02-15 NOTE — Progress Notes (Signed)
 PROGRESS NOTE        PATIENT DETAILS Name: Thomas [name] Age: 73 y.o. Sex: male Date of Birth: [birth year] Admit Date: 02/07/2024 Admitting Physician Roxana Copier, DO GEX:BMWUXL, [name], MD  Brief Summary: Patient is a 74 y.o.  male who with history of ESRD on HD TTS, history of posterior fusion at T10-12 due to osteomyelitis/discitis/epidural abscess in 2021, history of right BKA who presented with low back pain-upon further evaluation with CT imaging-he was found to have potential acute osteomyelitis involving his thoracic spine and admitted to the hospitalist service.  Significant events: 4/20>> admit to TRH-back pain-concern for thoracic osteomyelitis.  Significant studies: 4/21>> x-ray right elbow: No fracture. 4/21>> x-ray left wrist: No fracture 4/21>> x-ray left shoulder, no fracture. 4/21>> CT head: No acute intracranial abnormality 4/21>> CT C-spine: No fracture. 4/21>> CT abdomen/pelvis: No acute injury to chest/abdomen/pelvis 4/21>> CT T-spine: Prior posterior fusion from T10-T12-abnormal appearance with collapse of T10-lucency around T10 screws at the T9-10 disc space, T10-11 disc space concerning for discitis/osteomyelitis. 4/21>> CT L-spine: No acute bony abnormality, mild chronic compression fracture at L1. 4/22>> x-ray left foot: Chronic erosive changes about the forefoot similar to 11/21/2023-osteomyelitis/septic arthritis of the 1st/2nd MTP joints difficult to exclude. 4/25>> left ABI: Within normal limit.  Significant microbiology data: 4/22>> blood culture: No growth  Procedures: None  Consults: Neurosurgery Infectious disease Ortho  Subjective: Continues to plaint of pain in the back.  Appears comfortable.  Objective: Vitals: Blood pressure (!) 154/81, pulse (!) 57, temperature 98.3 F (36.8 C), temperature source Oral, resp. rate 17, height 6\' 5"  (1.956 m), weight 95.8 kg, SpO2 100%.    Exam: Awake/alert Atraumatic/normocephalic Abdomen: Soft nontender nondistended Right AKA-left foot ulceration appears unchanged Nonfocal exam.    Pertinent Labs/Radiology:    Latest Ref Rng & Units 02/14/2024    6:12 AM 02/11/2024    4:24 AM 02/09/2024    4:14 AM  CBC  WBC 4.0 - 10.5 K/uL 6.0  5.5  5.6   Hemoglobin 13.0 - 17.0 g/dL 24.4  9.0  01.0   Hematocrit 39.0 - 52.0 % 31.4  29.0  32.5   Platelets 150 - 400 K/uL 142  147  169     Lab Results  Component Value Date   NA 136 02/13/2024   K 4.6 02/13/2024   CL 99 02/13/2024   CO2 22 02/13/2024    Assessment/Plan: Acute osteomyelitis with discitis involving T9 to T11 vertebra Continues to have back pain-exam is stable. Per neurosurgery not an operative candidate Per IR-no window to aspirate Plan is for 6 weeks of vancomycin /cefepime -EOT 6/4 Blood cultures negative so far  Possible osteomyelitis of left foot involving 1st/2nd MTP joints. Chronic left second toe ulceration Intolerant to MRI in the past-not keen on getting one He just completed 6 weeks of daptomycin /cefepime  on 3/28-has refused amputation.  Concern is that he may have seeded his spine through foot infection. Evaluated by Marthena Slate 4/28-TMA planned on 4/30.  Mechanical fall Numerous imaging studies negative for fracture PT/OT eval.  ESRD on HD TTS Nephrology following and directing HD care  Hyperkalemia Resolved with HD.  Normocytic anemia Secondary to ESRD Aranesp /iron  deferred to nephrology service.  Chronic HFpEF Euvolemic on exam Fluid removal with HD  HTN BP stable Clonidine /amlodipine /losartan   HLD Statin  Hypothyroidism Synthroid   BPH Flomax /bethanechol   History of right BKA  BMI:  Estimated body mass index is 25.04 kg/m as calculated from the following:   Height as of this encounter: 6\' 5"  (1.956 m).   Weight as of this encounter: 95.8 kg.   Code status:   Code Status: Full Code   DVT Prophylaxis: heparin  injection  5,000 Units Start: 02/10/24 1400 SCDs Start: 02/08/24 2206   Family Communication: None at bedside   Disposition Plan: Status is: Inpatient Remains inpatient appropriate because: Severity of illness   Planned Discharge Destination:Home health   Diet: Diet Order             Diet regular Room service appropriate? Yes; Fluid consistency: Thin  Diet effective now                     Antimicrobial agents: Anti-infectives (From admission, onward)    Start     Dose/Rate Route Frequency Ordered Stop   02/11/24 1800  ceFEPIme  (MAXIPIME ) 2 g in sodium chloride  0.9 % 100 mL IVPB        2 g 200 mL/hr over 30 Minutes Intravenous Every T-Th-Sa (Hemodialysis) 02/10/24 1028     02/11/24 1200  vancomycin  (VANCOCIN ) IVPB 1000 mg/200 mL premix        1,000 mg 200 mL/hr over 60 Minutes Intravenous Every T-Th-Sa (Hemodialysis) 02/10/24 1028     02/11/24 0000  ceFEPime  (MAXIPIME ) IVPB        2 g Intravenous Every T-Th-Sa (Hemodialysis) 02/11/24 0747 03/23/24 2359   02/11/24 0000  vancomycin  IVPB        1,000 mg Intravenous Every T-Th-Sa (Hemodialysis) 02/11/24 0747 03/23/24 2359   02/10/24 1115  vancomycin  (VANCOREADY) IVPB 2000 mg/400 mL        2,000 mg 200 mL/hr over 120 Minutes Intravenous  Once 02/10/24 1028 02/10/24 1446   02/10/24 1115  ceFEPIme  (MAXIPIME ) 2 g in sodium chloride  0.9 % 100 mL IVPB        2 g 200 mL/hr over 30 Minutes Intravenous  Once 02/10/24 1028 02/10/24 1239        MEDICATIONS: Scheduled Meds:  amLODipine   10 mg Oral Daily   atorvastatin   80 mg Oral Daily   bethanechol   10 mg Oral BID   Chlorhexidine  Gluconate Cloth  6 each Topical Q0600   cloNIDine   0.1 mg Oral TID   darbepoetin (ARANESP ) injection - DIALYSIS  100 mcg Subcutaneous Q Sat-1800   heparin  injection (subcutaneous)  5,000 Units Subcutaneous Q8H   hydrALAZINE   25 mg Oral TID   leptospermum manuka honey  1 Application Topical Daily   levothyroxine   50 mcg Oral QAC breakfast   losartan   50  mg Oral Daily   metoprolol  tartrate  50 mg Oral BID   polyethylene glycol  17 g Oral BID   senna-docusate  4 tablet Oral BID   sevelamer  carbonate  1,600 mg Oral TID WC   tamsulosin   0.4 mg Oral Daily   Continuous Infusions:  ceFEPime  (MAXIPIME ) IV 2 g (02/13/24 1853)   vancomycin  Stopped (02/13/24 1757)   PRN Meds:.acetaminophen  **OR** acetaminophen , HYDROmorphone  (DILAUDID ) injection, melatonin, naLOXone  (NARCAN )  injection, ondansetron  (ZOFRAN ) IV, oxyCODONE , tiZANidine    I have personally reviewed following labs and imaging studies  LABORATORY DATA: CBC: Recent Labs  Lab 02/09/24 0414 02/11/24 0424 02/14/24 0612  WBC 5.6 5.5 6.0  NEUTROABS 3.8  --   --   HGB 10.3* 9.0* 10.2*  HCT 32.5* 29.0* 31.4*  MCV 108.0* 107.8* 105.4*  PLT 169 147* 142*    Basic  Metabolic Panel: Recent Labs  Lab 02/09/24 0406 02/11/24 0424 02/12/24 0505 02/13/24 1343  NA 138 136 133* 136  K 5.7* 5.2* 4.8 4.6  CL 101 99 98 99  CO2 21* 21* 21* 22  GLUCOSE 58* 94 98 125*  BUN 47* 47* 34* 57*  CREATININE 8.57* 7.62* 5.70* 7.31*  CALCIUM  8.0* 7.7* 8.1* 8.1*  MG 2.4  --   --   --   PHOS 8.2* 6.8* 5.1*  --     GFR: Estimated Creatinine Clearance: 11.9 mL/min (A) (by C-G formula based on SCr of 7.31 mg/dL (H)).  Liver Function Tests: Recent Labs  Lab 02/09/24 0406 02/11/24 0424 02/12/24 0505  AST 20  --   --   ALT 8  --   --   ALKPHOS 50  --   --   BILITOT 0.9  --   --   PROT 8.2*  --   --   ALBUMIN  3.4* 3.0* 3.4*   No results for input(s): "LIPASE", "AMYLASE" in the last 168 hours. No results for input(s): "AMMONIA" in the last 168 hours.  Coagulation Profile: Recent Labs  Lab 02/09/24 1931  INR 1.1    Cardiac Enzymes: No results for input(s): "CKTOTAL", "CKMB", "CKMBINDEX", "TROPONINI" in the last 168 hours.  BNP (last 3 results) No results for input(s): "PROBNP" in the last 8760 hours.  Lipid Profile: No results for input(s): "CHOL", "HDL", "LDLCALC", "TRIG",  "CHOLHDL", "LDLDIRECT" in the last 72 hours.  Thyroid Function Tests: No results for input(s): "TSH", "T4TOTAL", "FREET4", "T3FREE", "THYROIDAB" in the last 72 hours.  Anemia Panel: No results for input(s): "VITAMINB12", "FOLATE", "FERRITIN", "TIBC", "IRON ", "RETICCTPCT" in the last 72 hours.   Urine analysis:    Component Value Date/Time   COLORURINE YELLOW 01/14/2022 0248   APPEARANCEUR HAZY (A) 01/14/2022 0248   LABSPEC 1.009 01/14/2022 0248   PHURINE 7.0 01/14/2022 0248   GLUCOSEU NEGATIVE 01/14/2022 0248   HGBUR MODERATE (A) 01/14/2022 0248   BILIRUBINUR NEGATIVE 01/14/2022 0248   KETONESUR NEGATIVE 01/14/2022 0248   PROTEINUR 100 (A) 01/14/2022 0248   NITRITE NEGATIVE 01/14/2022 0248   LEUKOCYTESUR LARGE (A) 01/14/2022 0248    Sepsis Labs: Lactic Acid, Venous    Component Value Date/Time   LATICACIDVEN 1.0 12/31/2021 0048    MICROBIOLOGY: Recent Results (from the past 240 hours)  Culture, blood (Routine X 2) w Reflex to ID Panel     Status: None   Collection Time: 02/09/24  4:06 AM   Specimen: BLOOD  Result Value Ref Range Status   Specimen Description BLOOD BLOOD RIGHT HAND  Final   Special Requests   Final    BOTTLES DRAWN AEROBIC ONLY Blood Culture adequate volume   Culture   Final    NO GROWTH 5 DAYS Performed at [facility], [address], [place], [place] [address]    Report Status 02/14/2024 FINAL  Final  Culture, blood (Routine X 2) w Reflex to ID Panel     Status: None   Collection Time: 02/09/24  4:14 AM   Specimen: BLOOD  Result Value Ref Range Status   Specimen Description BLOOD BLOOD RIGHT HAND AEROBIC BOTTLE ONLY  Final   Special Requests   Final    BOTTLES DRAWN AEROBIC ONLY Blood Culture adequate volume   Culture   Final    NO GROWTH 5 DAYS Performed at [facility], [address] [place], [place] [address]    Report Status 02/14/2024 FINAL  Final  RADIOLOGY STUDIES/RESULTS: No results found.     LOS: 7 days    Kimberly Penna, MD  [facility]    To contact the attending provider between 7A-7P or the covering provider during after hours 7P-7A, please log into the web site [email or web address] and access using universal [place] password for that web site. If you do not have the password, please call the hospital operator.  02/15/2024, 11:01 AM

## 2024-02-15 NOTE — Progress Notes (Signed)
 ID PROGRESS NOTE   73yo M with ESRD, with thoracic OM and left foot wound/osteomyelitis  Dr Julio Ohm evaluated patient and anticipate to get TMA of left foot for source control  End Date of vanco and cefepime  with HD through 03/23/24. We will arrange for follow up  Thomas [name] B. Thomas Reader MD MPH [facility] [phone]

## 2024-02-15 NOTE — H&P (View-Only) (Signed)
 ORTHOPAEDIC CONSULTATION  REQUESTING PHYSICIAN: Burton Casey, MD  Chief Complaint: Pain swelling and drainage toes left foot.  HPI: Thomas [name] is a 73 y.o. male who presents with ulcerations swelling and drainage from the toes of the left foot.  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  Patient has persistent gout symptoms and ulcers with drainage in the left foot.  Patient is end-stage renal disease on dialysis.  Past Medical History:  Diagnosis Date   Arthritis    BPH (benign prostatic hyperplasia) 11/24/2019   Chronic pain syndrome 11/24/2019   Diabetes mellitus without complication (HCC)    ESRD (end stage renal disease) (HCC)    on HD   Gout    Hypertension    Muscle pain    Physical deconditioning 11/24/2019   Scars    Swelling    Past Surgical History:  Procedure Laterality Date   BELOW KNEE LEG AMPUTATION Right    ESOPHAGOGASTRODUODENOSCOPY (EGD) WITH PROPOFOL  N/A 11/25/2019   Procedure: ESOPHAGOGASTRODUODENOSCOPY (EGD) WITH PROPOFOL ;  Surgeon: Alvis Jourdain, MD;  Location: [facility] ENDOSCOPY;  Service: Endoscopy;  Laterality: N/A;   IR FLUORO GUIDE CV LINE RIGHT  09/05/2019   IR LUMBAR DISC ASPIRATION W/IMG GUIDE  11/26/2019   IR US  GUIDE VASC ACCESS RIGHT  09/05/2019   RADIOLOGY WITH ANESTHESIA N/A 11/25/2019   Procedure: Thoracic and Lumbar MRI WITH ANESTHESIA;  Surgeon: Radiologist, Medication, MD;  Location: [facility] OR;  Service: Radiology;  Laterality: N/A;   Social History   Socioeconomic History   Marital status: Single    Spouse name: Not on file   Number of children: Not on file   Years of education: Not on file   Highest education level: Not on file  Occupational History   Not on file  Tobacco Use   Smoking status: Never    Passive exposure: Never   Smokeless tobacco: Never  Vaping Use   Vaping status: Never Used  Substance and Sexual Activity   Alcohol use: No   Drug  use: No   Sexual activity: Not Currently  Other Topics Concern   Not on file  Social History Narrative   Not on file   Social Drivers of Health   Financial Resource Strain: Not on file  Food Insecurity: No Food Insecurity (02/09/2024)   Hunger Vital Sign    Worried About Running Out of Food in the Last Year: Never true    Ran Out of Food in the Last Year: Never true  Transportation Needs: No Transportation Needs (02/09/2024)   PRAPARE - Administrator, Civil Service (Medical): No    Lack of Transportation (Non-Medical): No  Physical Activity: Not on file  Stress: Not on file  Social Connections: Socially Isolated (02/11/2024)   Social Connection and Isolation Panel [NHANES]    Frequency of Communication with Friends and Family: More than three times a week    Frequency of Social Gatherings with Friends and Family: Once a week    Attends Religious Services: Never    Database administrator or Organizations: No    Attends Engineer, structural: Never    Marital Status: Never married   Family History  Problem Relation Age of Onset   Heart disease Mother    Cancer Father    - negative except otherwise stated in the family history section Allergies  Allergen Reactions   Other Other (See Comments)  Blood pressure issues Per [facility] hospital: Pt states he can only take these pain meds or else he gets very sick: Oxycontin ,morphine ,demerol, and dilaudid  are the only pain meds pt states he can take.     Oxymorphone Other (See Comments)    Causes kidney problems   Amitriptyline Other (See Comments)   Aspirin Nausea And Vomiting    Gi upset, has kidney issues   Beta Vulgaris Nausea And Vomiting   Buspirone Other (See Comments)    Affected his head    Cabbage Nausea And Vomiting   Codeine Other (See Comments)    Pt does not remember the reaction but knows he can not take    Cyclobenzaprine Nausea And Vomiting   Diflunisal Nausea And Vomiting and Other (See  Comments)   Fish Allergy Nausea And Vomiting   Fish-Derived Products Nausea And Vomiting   Hydroxyzine Nausea And Vomiting   Levetiracetam Other (See Comments)   Methadone Other (See Comments)    Made him loopy   Nalbuphine Other (See Comments)    Cutaneous eruption (morphologic abnormality)   Pentazocine Other (See Comments)    Pt does not remember the reaction    Propoxyphene Other (See Comments)    Pt does not remember the reaction     Shellfish Allergy Nausea And Vomiting   Sulfa Antibiotics Hives   Sulfasalazine Other (See Comments)    Pt does not remember the reaction   Amoxicillin  Nausea And Vomiting and Rash    Per [facility]   Prior to Admission medications   Medication Sig Start Date End Date Taking? Authorizing Provider  amLODipine  (NORVASC ) 10 MG tablet Take 10 mg by mouth daily. 01/25/24  Yes [provider]  atorvastatin  (LIPITOR ) 80 MG tablet Take 80 mg by mouth daily.   Yes [provider]  bethanechol  (URECHOLINE ) 10 MG tablet Take 10 mg by mouth 2 (two) times daily. 12/21/23  Yes [provider]  ceFEPime  (MAXIPIME ) IVPB Inject 2 g into the vein Every Tuesday,Thursday,and Saturday with dialysis. Indication:  Discitis/osteo Last Day of Therapy:  03/23/24 Labs - Once weekly:  CBC/D and BMP, ESR and CRP Method of administration: Per hemodialysis protocol, to be given at HD center= 02/11/24 03/23/24 Yes Liane Redman, MD  Cholecalciferol  125 MCG (5000 UT) capsule Take 5,000 Units by mouth daily.   Yes [provider]  cloNIDine  (CATAPRES ) 0.1 MG tablet Take 0.1 mg by mouth 3 (three) times daily as needed.   Yes [provider]  cyanocobalamin  (VITAMIN B12) 1000 MCG tablet Take 1 tablet by mouth daily.   Yes [provider]  hydrALAZINE  (APRESOLINE ) 25 MG tablet Take 25 mg by mouth 3 (three) times daily.   Yes [provider]  levothyroxine  (SYNTHROID ) 50 MCG tablet Take 50 mcg by mouth daily before  breakfast.    Yes [provider]  losartan  (COZAAR ) 50 MG tablet Take 50 mg by mouth at bedtime. 11/11/23  Yes [provider]  magnesium  oxide (MAG-OX) 400 MG tablet Take 800 mg by mouth in the morning, at noon, and at bedtime.   Yes [provider]  metoprolol  tartrate (LOPRESSOR ) 50 MG tablet Take 1 tablet (50 mg total) by mouth 2 (two) times daily. Patient taking differently: Take 50 mg by mouth 2 (two) times daily as needed (Hypertension). 12/24/21  Yes Samtani, Jai-Gurmukh, MD  OXYCONTIN  30 MG 12 hr tablet Take 30 mg by mouth 3 (three) times daily as needed (pain). 12/30/21  Yes [provider]  senna-docusate (SENOKOT-S) 8.6-50 MG tablet Take 4 tablets by mouth 2 (two) times daily. 11/04/22  Yes [provider]  sevelamer  (RENAGEL ) 800 MG tablet Take 1,600 mg by mouth 3 (three) times daily. 08/27/23  Yes [provider]  tamsulosin  (FLOMAX ) 0.4 MG CAPS capsule Take 1 capsule (0.4 mg total) by mouth daily. 09/25/20  Yes Avi Body, MD  tiZANidine  (ZANAFLEX ) 4 MG tablet Take 4 mg by mouth every 8 (eight) hours as needed for muscle spasms. 11/26/21  Yes [provider]  vancomycin  IVPB Inject 1,000 mg into the vein Every Tuesday,Thursday,and Saturday with dialysis. Indication:  Discitis/osteo Last Day of Therapy:  03/23/24 Labs - Once weekly:  CBC/D, BMP, and pre-HD Vanc level (goal 15-25 mcg/ml), ESR and CRP Method of administration: Per hemodialysis protocol - to be given at HD center 02/11/24 03/23/24 Yes Liane Redman, MD  Vitamin E 670 MG (1000 UT) CAPS Take 1,000 mg by mouth daily.   Yes [provider]  furosemide  (LASIX ) 40 MG tablet Take 40 mg by mouth daily. 11/26/23   [provider]  warfarin (COUMADIN ) 10 MG tablet Take 10 mg by mouth daily. Patient not taking: Reported on 02/09/2024    [provider]   No results found. - pertinent xrays, CT, MRI studies were reviewed and independently  interpreted  Positive ROS: All other systems have been reviewed and were otherwise negative with the exception of those mentioned in the HPI and as above.  Physical Exam: General: Alert, no acute distress Psychiatric: Patient is competent for consent with normal mood and affect Lymphatic: No axillary or cervical lymphadenopathy Cardiovascular: No pedal edema Respiratory: No cyanosis, no use of accessory musculature GI: No organomegaly, abdomen is soft and non-tender    Images:  @ENCIMAGES @  Labs:  Lab Results  Component Value Date   HGBA1C 4.2 (L) 02/09/2024   HGBA1C 4.8 12/19/2021   HGBA1C 6.0 (H) 09/20/2021   ESRSEDRATE 130 (H) 02/12/2024   ESRSEDRATE 112 (H) 02/09/2024   ESRSEDRATE 110 (H) 12/07/2021   CRP 1.4 (H) 02/09/2024   CRP 1.4 (H) 01/14/2022   CRP 3.0 (H) 12/07/2021   REPTSTATUS 02/14/2024 FINAL 02/09/2024   GRAMSTAIN NO WBC SEEN NO ORGANISMS SEEN  09/04/2020   CULT  02/09/2024    NO GROWTH 5 DAYS Performed at [facility], [address] [place], [place] [address]     Lab Results  Component Value Date   ALBUMIN  3.4 (L) 02/12/2024   ALBUMIN  3.0 (L) 02/11/2024   ALBUMIN  3.4 (L) 02/09/2024   PREALBUMIN 23.8 11/24/2019        Latest Ref Rng & Units 02/14/2024    6:12 AM 02/11/2024    4:24 AM 02/09/2024    4:14 AM  CBC EXTENDED  WBC 4.0 - 10.5 K/uL 6.0  5.5  5.6   RBC 4.22 - 5.81 MIL/uL 2.98  2.69  3.01   Hemoglobin 13.0 - 17.0 g/dL 16.6  9.0  06.3   HCT 01.6 - 52.0 % 31.4  29.0  32.5   Platelets 150 - 400 K/uL 142  147  169   NEUT# 1.7 - 7.7 K/uL   3.8   Lymph# 0.7 - 4.0 K/uL   1.1     Neurologic: Patient does not have protective sensation bilateral lower extremities.   MUSCULOSKELETAL:   Skin: Examination patient has decreased swelling the left foot at this time with wrinkling of the skin.  There is sausage digit swelling of the toes of the left  foot.  Patient has a palpable dorsalis pedis pulse.  Ankle-brachial indices shows good  multiphasic circulation to the ankle.  Radiograph shows destructive bony changes of the toes consistent with chronic osteomyelitis and radiographs through the midfoot shows periarticular cystic changes consistent with chronic gout.  Hemoglobin 10.2 with a white cell count of 6.0.  Sed rate 130 and C-reactive protein 14.  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   Plan: Plan: Will plan for a transmetatarsal amputation on the left.  I will order uric acid.  Thank you for the consult and the opportunity to see Mr. Durl Walthall, MD [facility] [phone] 6:45 AM

## 2024-02-15 NOTE — Progress Notes (Signed)
 PT Cancellation Note  Patient Details Name: Thomas [name] MRN: [id number] DOB: 1951/09/02   Cancelled Treatment:    Reason Eval/Treat Not Completed: Other (comment) Attempted to see pt for PT tx with pt adamantly declining 2/2 pain. Nurse in room administering meds at this time & pt continues to decline.  Emaline Handsome, PT, DPT 02/15/24, 3:04 PM   [name] 02/15/2024, 3:04 PM

## 2024-02-15 NOTE — Plan of Care (Signed)
  Problem: Education: Goal: Ability to describe self-care measures that may prevent or decrease complications (Diabetes Survival Skills Education) will improve Outcome: Progressing   Problem: Coping: Goal: Ability to adjust to condition or change in health will improve Outcome: Progressing   Problem: Fluid Volume: Goal: Ability to maintain a balanced intake and output will improve Outcome: Progressing   Problem: Health Behavior/Discharge Planning: Goal: Ability to identify and utilize available resources and services will improve Outcome: Progressing   Problem: Metabolic: Goal: Ability to maintain appropriate glucose levels will improve Outcome: Progressing   Problem: Nutritional: Goal: Maintenance of adequate nutrition will improve Outcome: Progressing   Problem: Skin Integrity: Goal: Risk for impaired skin integrity will decrease Outcome: Progressing   Problem: Education: Goal: Knowledge of General Education information will improve Description: Including pain rating scale, medication(s)/side effects and non-pharmacologic comfort measures Outcome: Progressing

## 2024-02-16 DIAGNOSIS — E039 Hypothyroidism, unspecified: Secondary | ICD-10-CM | POA: Diagnosis not present

## 2024-02-16 DIAGNOSIS — I10 Essential (primary) hypertension: Secondary | ICD-10-CM | POA: Diagnosis not present

## 2024-02-16 DIAGNOSIS — E119 Type 2 diabetes mellitus without complications: Secondary | ICD-10-CM | POA: Diagnosis not present

## 2024-02-16 DIAGNOSIS — M4624 Osteomyelitis of vertebra, thoracic region: Secondary | ICD-10-CM | POA: Diagnosis not present

## 2024-02-16 LAB — CBC
HCT: 31.1 % — ABNORMAL LOW (ref 39.0–52.0)
Hemoglobin: 10.2 g/dL — ABNORMAL LOW (ref 13.0–17.0)
MCH: 34.1 pg — ABNORMAL HIGH (ref 26.0–34.0)
MCHC: 32.8 g/dL (ref 30.0–36.0)
MCV: 104 fL — ABNORMAL HIGH (ref 80.0–100.0)
Platelets: 155 10*3/uL (ref 150–400)
RBC: 2.99 MIL/uL — ABNORMAL LOW (ref 4.22–5.81)
RDW: 14 % (ref 11.5–15.5)
WBC: 5.4 10*3/uL (ref 4.0–10.5)
nRBC: 0 % (ref 0.0–0.2)

## 2024-02-16 LAB — RENAL FUNCTION PANEL
Albumin: 3.2 g/dL — ABNORMAL LOW (ref 3.5–5.0)
Anion gap: 13 (ref 5–15)
BUN: 62 mg/dL — ABNORMAL HIGH (ref 8–23)
CO2: 23 mmol/L (ref 22–32)
Calcium: 8.6 mg/dL — ABNORMAL LOW (ref 8.9–10.3)
Chloride: 100 mmol/L (ref 98–111)
Creatinine, Ser: 8.09 mg/dL — ABNORMAL HIGH (ref 0.61–1.24)
GFR, Estimated: 7 mL/min — ABNORMAL LOW (ref >60)
Glucose, Bld: 87 mg/dL (ref 70–99)
Phosphorus: 7.3 mg/dL — ABNORMAL HIGH (ref 2.5–4.6)
Potassium: 5.9 mmol/L — ABNORMAL HIGH (ref 3.5–5.1)
Sodium: 136 mmol/L (ref 135–145)

## 2024-02-16 LAB — GLUCOSE, CAPILLARY
Glucose-Capillary: 86 mg/dL (ref 70–99)
Glucose-Capillary: 100 mg/dL — ABNORMAL HIGH (ref 70–99)

## 2024-02-16 MED ORDER — SODIUM ZIRCONIUM CYCLOSILICATE 10 G PO PACK: 10.0000 g | PACK | Freq: Every day | ORAL | Status: AC

## 2024-02-16 MED ORDER — HEPARIN SODIUM (PORCINE) 1000 UNIT/ML IJ SOLN
INTRAMUSCULAR | Status: AC
  Filled 2024-02-16: qty 1

## 2024-02-16 MED ORDER — HYDROMORPHONE HCL 1 MG/ML IJ SOLN
INTRAMUSCULAR | Status: AC
  Filled 2024-02-16: qty 1

## 2024-02-16 MED ORDER — SODIUM CHLORIDE 0.9 % IV SOLN
12.5000 mg | Freq: Three times a day (TID) | INTRAVENOUS | Status: DC | PRN
  Administered 2024-03-02 – 2024-03-13 (×6): 12.5 mg via INTRAVENOUS
  Filled 2024-02-16: qty 12.5
  Filled 2024-02-16: qty 0.5
  Filled 2024-02-16: qty 12.5
  Filled 2024-02-16 (×2): qty 0.5
  Filled 2024-02-16: qty 12.5
  Filled 2024-02-16: qty 0.5

## 2024-02-16 MED ORDER — VANCOMYCIN HCL IN DEXTROSE 1-5 GM/200ML-% IV SOLN
INTRAVENOUS | Status: AC
  Filled 2024-02-16: qty 200

## 2024-02-16 NOTE — Progress Notes (Signed)
 [facility] Progress Note   Subjective:  Seen in HD unit, sitting up in bed Doing puzzles Looks good today  Objective Vitals:   02/16/24 1230 02/16/24 1250 02/16/24 1305 02/16/24 1320  BP: 119/77 (!) 143/99 (!) 173/103 (!) 170/107  Pulse: 95 86 90 81  Resp: 13 (!) 23 (!) 21 15  Temp:    98.5 F (36.9 C)  TempSrc:      SpO2: 99% 95% 98% 100%  Weight:    92 kg  Height:        Basic Metabolic Panel: Recent Labs  Lab 02/11/24 0424 02/12/24 0505 02/13/24 1343 02/16/24 0406  NA 136 133* 136 136  K 5.2* 4.8 4.6 5.9*  CL 99 98 99 100  CO2 21* 21* 22 23  GLUCOSE 94 98 125* 87  BUN 47* 34* 57* 62*  CREATININE 7.62* 5.70* 7.31* 8.09*  CALCIUM  7.7* 8.1* 8.1* 8.6*  PHOS 6.8* 5.1*  --  7.3*    Recent Labs  Lab 02/11/24 0424 02/14/24 0612 02/16/24 0406  WBC 5.5 6.0 5.4  HGB 9.0* 10.2* 10.2*  HCT 29.0* 31.4* 31.1*  MCV 107.8* 105.4* 104.0*  PLT 147* 142* 155    Medications:  ceFEPime  (MAXIPIME ) IV 2 g (02/13/24 1853)   vancomycin  1,000 mg (02/16/24 1151)    amLODipine   10 mg Oral Daily   atorvastatin   80 mg Oral Daily   bethanechol   10 mg Oral BID   Chlorhexidine  Gluconate Cloth  6 each Topical Q0600   Chlorhexidine  Gluconate Cloth  6 each Topical Q0600   cloNIDine   0.1 mg Oral TID   darbepoetin (ARANESP ) injection - DIALYSIS  100 mcg Subcutaneous Q Sat-1800   heparin  injection (subcutaneous)  5,000 Units Subcutaneous Q8H   hydrALAZINE   25 mg Oral TID   leptospermum manuka honey  1 Application Topical Daily   levothyroxine   50 mcg Oral QAC breakfast   losartan   50 mg Oral Daily   metoprolol  tartrate  50 mg Oral BID   polyethylene glycol  17 g Oral BID   senna-docusate  4 tablet Oral BID   sevelamer  carbonate  1,600 mg Oral TID WC   sodium zirconium cyclosilicate   10 g Oral Daily   tamsulosin   0.4 mg Oral Daily   Physical Exam General:Chronically ill appearing male, NAD. Room air Heart: RRR; 2/6 murmur Lungs: CTAB Abdomen: soft,  ntnd Extremities: R BKA, no LLE edema, dry scabbed skin and bandaged toe Dialysis Access: TDC drsg intact  Dialysis: TTS [place] 4:15hr, 400/A1.5, EDW 92kg, 2K/2.5Ca, TDC, no heparin  - Mircera 225mcg IV q 2 weeks (last 4/9) - Calcitriol  0.25mcg PO q HD   Assessment/Plan:  Thoracic discitis/OM: recent acute on chronic back pain, s/p fall on 4/20. CT imaging showed T9-10 discitis. He refused MRI, area too small for aspiration per IR. Earlier this year he completed an 8 week course of Dapto/Cefepime  on 3/20 for bacteremia presumed due to foot infection. Concern this infection may have seeded in spine. Pt is now agreeing to amputation. Ortho consulting. Now is on IV Vanc/Cefepime  for 6 week course for discitis. Blood cx's were negative.   ESRD:  Usual TTS schedule. HD today.   HTN/volume: BP ok, no edema. Appears euvolemic, low UF goal next HD d/t severe cramping with last. Looks dry on exam. RA.   Anemia: HGB 10.2. Follow labs. On weekly ESA. No venofer in setting of infection.  Metabolic bone disease: Ca ok, Phos high - resumed home binders.  Nutrition: Alb 3.4,  follow.  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  [name]  MD  [facility] 02/16/2024, 2:56 PM  Recent Labs  Lab 02/12/24 0505 02/13/24 1343 02/14/24 0612 02/16/24 0406  HGB  --   --  10.2* 10.2*  ALBUMIN  3.4*  --   --  3.2*  CALCIUM  8.1* 8.1*  --  8.6*  PHOS 5.1*  --   --  7.3*  CREATININE 5.70* 7.31*  --  8.09*  K 4.8 4.6  --  5.9*

## 2024-02-16 NOTE — Plan of Care (Signed)
  Problem: Fluid Volume: Goal: Ability to maintain a balanced intake and output will improve Outcome: Progressing   Problem: Health Behavior/Discharge Planning: Goal: Ability to identify and utilize available resources and services will improve Outcome: Progressing Goal: Ability to manage health-related needs will improve Outcome: Progressing   Problem: Nutritional: Goal: Maintenance of adequate nutrition will improve Outcome: Progressing   Problem: Tissue Perfusion: Goal: Adequacy of tissue perfusion will improve Outcome: Progressing   Problem: Health Behavior/Discharge Planning: Goal: Ability to manage health-related needs will improve Outcome: Progressing   Problem: Clinical Measurements: Goal: Will remain free from infection Outcome: Progressing Goal: Diagnostic test results will improve Outcome: Progressing   Problem: Activity: Goal: Risk for activity intolerance will decrease Outcome: Progressing

## 2024-02-16 NOTE — Progress Notes (Signed)
   02/16/24 1320  Vitals  Temp 98.5 F (36.9 C)  Pulse Rate 81  Resp 15  BP (!) 170/107  SpO2 100 %  O2 Device Room Air  Weight 92 kg  Type of Weight Post-Dialysis  Oxygen Therapy  Patient Activity (if Appropriate) In bed  Pulse Oximetry Type Continuous  Post Treatment  Dialyzer Clearance Lightly streaked  Hemodialysis Intake (mL) 100 mL  Liters Processed 83.8  Fluid Removed (mL) 700 mL  Tolerated HD Treatment Yes  Post-Hemodialysis Comments Pt unable to reach 1L-1.5L due to cartridge change because of clotting. Pt also complained of cramps during treatment requiring 100ml ns bolus and reduction in goal.   Received patient in bed to unit.  Alert and oriented.  Informed consent signed and in chart.   TX duration: 3 hours and 30 minutes  Patient tolerated well.  Transported back to the room  Alert, without acute distress.  Hand-off given to patient's nurse.   Access used: Right HD chest catheter Access issues: None Medication(s) given: 1g vancomycin

## 2024-02-16 NOTE — Progress Notes (Signed)
 OT Cancellation Note  Patient Details Name: Thomas [name] MRN: [id number] DOB: Aug 30, 1951   Cancelled Treatment:    Reason Eval/Treat Not Completed: (P) Patient at procedure or test/ unavailable, will check back as able.  Scherry Curtis 02/16/2024, 1:38 PM

## 2024-02-16 NOTE — Progress Notes (Signed)
 PT Cancellation Note  Patient Details Name: Thomas [name] MRN: [id number] DOB: December 05, 1950   Cancelled Treatment:    Reason Eval/Treat Not Completed: Patient at procedure or test/unavailable  Pt currently off unit to [facility].  Will check back later today or possibly tomorrow as schedule permits. Of note, pt has declined to work with physical therapy on our last 3 attempts.  Jory Ng, PT, DPT [facility]  Rehabilitation Services Physical Therapist Office: [phone] Website: [email or web address]  [name] 02/16/2024, 12:03 PM

## 2024-02-16 NOTE — Progress Notes (Signed)
 PROGRESS NOTE        PATIENT DETAILS Name: Thomas [name] Age: 73 y.o. Sex: male Date of Birth: [birth year] Admit Date: 02/07/2024 Admitting Physician Roxana Copier, DO [id number], [name], MD  Brief Summary: Patient is a 73 y.o.  male who with history of ESRD on HD TTS, history of posterior fusion at T10-12 due to osteomyelitis/discitis/epidural abscess in 2021, history of right BKA who presented with low back pain-upon further evaluation with CT imaging-he was found to have potential acute osteomyelitis involving his thoracic spine and admitted to the hospitalist service.  Significant events: 4/20>> admit to TRH-back pain-concern for thoracic osteomyelitis.  Significant studies: 4/21>> x-ray right elbow: No fracture. 4/21>> x-ray left wrist: No fracture 4/21>> x-ray left shoulder, no fracture. 4/21>> CT head: No acute intracranial abnormality 4/21>> CT C-spine: No fracture. 4/21>> CT abdomen/pelvis: No acute injury to chest/abdomen/pelvis 4/21>> CT T-spine: Prior posterior fusion from T10-T12-abnormal appearance with collapse of T10-lucency around T10 screws at the T9-10 disc space, T10-11 disc space concerning for discitis/osteomyelitis. 4/21>> CT L-spine: No acute bony abnormality, mild chronic compression fracture at L1. 4/22>> x-ray left foot: Chronic erosive changes about the forefoot similar to 11/21/2023-osteomyelitis/septic arthritis of the 1st/2nd MTP joints difficult to exclude. 4/25>> left ABI: Within normal limit.  Significant microbiology data: 4/22>> blood culture: No growth  Procedures: None  Consults: Neurosurgery Infectious disease Ortho  Subjective: Appears comfortable-talking in full sentences-no chest pain/shortness of breath.  Complains of back/left foot pain.  Objective: Vitals: Blood pressure (!) 117/102, pulse 80, temperature 98 F (36.7 C), resp. rate 16, height 6\' 5"  (1.956 m), weight 92.7 kg, SpO2 99%.    Exam: Awake/alert Abdomen: Soft nontender nondistended Nonfocal exam Right BKA.  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 Per neurosurgery not an operative candidate Per IR-no window to aspirate Plan is for 6 weeks of vancomycin /cefepime  with hemodialysis-EOT 6/4 Blood cultures negative so far  Possible osteomyelitis of left foot involving 1st/2nd MTP joints. Chronic left second toe ulceration Intolerant to MRI in the past-not keen on getting one He just completed 6 weeks of daptomycin /cefepime  on 3/28-has refused amputation.  Concern is that he may have seeded his spine through foot infection. Evaluated by Dr. Julio Ohm on 4/28-TMA planned on 4/30.  Mechanical fall Numerous imaging studies negative for fracture PT/OT eval.  ESRD on HD TTS Nephrology following and directing HD care  Hyperkalemia Reoccurred again on 4/29-Lokelma  x 1 dose-for HD later today.  Normocytic anemia Secondary to ESRD Aranesp /iron  deferred to nephrology service.  Chronic HFpEF Euvolemic on exam Fluid removal with HD  HTN BP stable Clonidine /amlodipine /losartan   HLD Statin  Hypothyroidism Synthroid   BPH Flomax /bethanechol   History  of right BKA  BMI: Estimated body mass index is 24.23 kg/m as calculated from the following:   Height as of this encounter: 6\' 5"  (1.956 m).   Weight as of this encounter: 92.7 kg.   Code status:   Code Status: Full Code   DVT Prophylaxis: heparin   injection 5,000 Units Start: 02/10/24 1400 SCDs Start: 02/08/24 2206   Family Communication: None at bedside   Disposition Plan: Status is: Inpatient Remains inpatient appropriate because: Severity of illness   Planned Discharge Destination:Home health   Diet: Diet Order             Diet regular Room service appropriate? Yes; Fluid consistency: Thin  Diet effective now                     Antimicrobial agents: Anti-infectives (From admission, onward)    Start     Dose/Rate Route Frequency Ordered Stop   02/11/24 1800  ceFEPIme  (MAXIPIME ) 2 g in sodium chloride  0.9 % 100 mL IVPB        2 g 200 mL/hr over 30 Minutes Intravenous Every T-Th-Sa (Hemodialysis) 02/10/24 1028     02/11/24 1200  vancomycin  (VANCOCIN ) IVPB 1000 mg/200 mL premix        1,000 mg 200 mL/hr over 60 Minutes Intravenous Every T-Th-Sa (Hemodialysis) 02/10/24 1028     02/11/24 0000  ceFEPime  (MAXIPIME ) IVPB        2 g Intravenous Every T-Th-Sa (Hemodialysis) 02/11/24 0747 03/23/24 2359   02/11/24 0000  vancomycin  IVPB        1,000 mg Intravenous Every T-Th-Sa (Hemodialysis) 02/11/24 0747 03/23/24 2359   02/10/24 1115  vancomycin  (VANCOREADY) IVPB 2000 mg/400 mL        2,000 mg 200 mL/hr over 120 Minutes Intravenous  Once 02/10/24 1028 02/10/24 1446   02/10/24 1115  ceFEPIme  (MAXIPIME ) 2 g in sodium chloride  0.9 % 100 mL IVPB        2 g 200 mL/hr over 30 Minutes Intravenous  Once 02/10/24 1028 02/10/24 1239        MEDICATIONS: Scheduled Meds:  amLODipine   10 mg Oral Daily   atorvastatin   80 mg Oral Daily   bethanechol   10 mg Oral BID   Chlorhexidine  Gluconate Cloth  6 each Topical Q0600   Chlorhexidine  Gluconate Cloth  6 each Topical Q0600   cloNIDine   0.1 mg Oral TID   darbepoetin (ARANESP ) injection - DIALYSIS  100 mcg Subcutaneous Q Sat-1800   heparin  injection (subcutaneous)  5,000 Units Subcutaneous Q8H   hydrALAZINE   25 mg Oral TID   leptospermum manuka honey  1 Application Topical  Daily   levothyroxine   50 mcg Oral QAC breakfast   losartan   50 mg Oral Daily   metoprolol  tartrate  50 mg Oral BID   polyethylene glycol  17 g Oral BID   senna-docusate  4 tablet Oral BID   sevelamer  carbonate  1,600 mg Oral TID WC   sodium zirconium cyclosilicate   10 g Oral Daily   tamsulosin   0.4 mg Oral Daily   Continuous Infusions:  ceFEPime  (MAXIPIME ) IV 2 g (02/13/24 1853)   vancomycin  Stopped (02/13/24 1757)   PRN Meds:.acetaminophen  **OR** acetaminophen , HYDROmorphone  (DILAUDID ) injection, melatonin, naLOXone  (NARCAN )  injection, ondansetron  (ZOFRAN ) IV, oxyCODONE , tiZANidine    I have personally reviewed following labs and imaging studies  LABORATORY DATA: CBC: Recent Labs  Lab 02/11/24 0424 02/14/24 0612 02/16/24 0406  WBC 5.5 6.0 5.4  HGB 9.0* 10.2* 10.2*  HCT 29.0*  31.4* 31.1*  MCV 107.8* 105.4* 104.0*  PLT 147* 142* 155    Basic Metabolic Panel: Recent Labs  Lab 02/11/24 0424 02/12/24 0505 02/13/24 1343 02/16/24 0406  NA 136 133* 136 136  K 5.2* 4.8 4.6 5.9*  CL 99 98 99 100  CO2 21* 21* 22 23  GLUCOSE 94 98 125* 87  BUN 47* 34* 57* 62*  CREATININE 7.62* 5.70* 7.31* 8.09*  CALCIUM  7.7* 8.1* 8.1* 8.6*  PHOS 6.8* 5.1*  --  7.3*    GFR: Estimated Creatinine Clearance: 10.7 mL/min (A) (by C-G formula based on SCr of 8.09 mg/dL (H)).  Liver Function Tests: Recent Labs  Lab 02/11/24 0424 02/12/24 0505 02/16/24 0406  ALBUMIN  3.0* 3.4* 3.2*   No results for input(s): "LIPASE", "AMYLASE" in the last 168 hours. No results for input(s): "AMMONIA" in the last 168 hours.  Coagulation Profile: Recent Labs  Lab 02/09/24 1931  INR 1.1    Cardiac Enzymes: No results for input(s): "CKTOTAL", "CKMB", "CKMBINDEX", "TROPONINI" in the last 168 hours.  BNP (last 3 results) No results for input(s): "PROBNP" in the last 8760 hours.  Lipid Profile: No results for input(s): "CHOL", "HDL", "LDLCALC", "TRIG", "CHOLHDL", "LDLDIRECT" in the last 72  hours.  Thyroid Function Tests: No results for input(s): "TSH", "T4TOTAL", "FREET4", "T3FREE", "THYROIDAB" in the last 72 hours.  Anemia Panel: No results for input(s): "VITAMINB12", "FOLATE", "FERRITIN", "TIBC", "IRON ", "RETICCTPCT" in the last 72 hours.   Urine analysis:    Component Value Date/Time   COLORURINE YELLOW 01/14/2022 0248   APPEARANCEUR HAZY (A) 01/14/2022 0248   LABSPEC 1.009 01/14/2022 0248   PHURINE 7.0 01/14/2022 0248   GLUCOSEU NEGATIVE 01/14/2022 0248   HGBUR MODERATE (A) 01/14/2022 0248   BILIRUBINUR NEGATIVE 01/14/2022 0248   KETONESUR NEGATIVE 01/14/2022 0248   PROTEINUR 100 (A) 01/14/2022 0248   NITRITE NEGATIVE 01/14/2022 0248   LEUKOCYTESUR LARGE (A) 01/14/2022 0248    Sepsis Labs: Lactic Acid, Venous    Component Value Date/Time   LATICACIDVEN 1.0 12/31/2021 0048    MICROBIOLOGY: Recent Results (from the past 240 hours)  Culture, blood (Routine X 2) w Reflex to ID Panel     Status: None   Collection Time: 02/09/24  4:06 AM   Specimen: BLOOD  Result Value Ref Range Status   Specimen Description BLOOD BLOOD RIGHT HAND  Final   Special Requests   Final    BOTTLES DRAWN AEROBIC ONLY Blood Culture adequate volume   Culture   Final    NO GROWTH 5 DAYS Performed at [facility], [address], [place], [place] [address]    Report Status 02/14/2024 FINAL  Final  Culture, blood (Routine X 2) w Reflex to ID Panel     Status: None   Collection Time: 02/09/24  4:14 AM   Specimen: BLOOD  Result Value Ref Range Status   Specimen Description BLOOD BLOOD RIGHT HAND AEROBIC BOTTLE ONLY  Final   Special Requests   Final    BOTTLES DRAWN AEROBIC ONLY Blood Culture adequate volume   Culture   Final    NO GROWTH 5 DAYS Performed at [facility], [address] [place], [place] [address]    Report Status 02/14/2024 FINAL  Final    RADIOLOGY STUDIES/RESULTS: No results found.     LOS: 8 days   Kimberly Penna, MD  [facility]    To contact the attending provider between 7A-7P or the covering provider during after hours 7P-7A,  please log into the web site [email or web address] and access using universal [place] password for that web site. If you do not have the password, please call the hospital operator.  02/16/2024, 10:41 AM

## 2024-02-16 NOTE — TOC Progression Note (Signed)
 Transition of Care [facility]) - Progression Note    Patient Details  Name: Thomas [name] MRN: [id number] Date of Birth: 10-31-1950  [facility] of [facility]) CM/SW Contact  Jannice Mends, LCSW Phone Number: 02/16/2024, 4:09 PM  Clinical Narrative:    Patient with no [facility] offers. Will locate outside facility and see which dialysis center needs to be looked into.   Expected Discharge Plan: Skilled Nursing Facility Barriers to Discharge: Continued Medical Work up, English as a second language teacher, SNF Pending bed offer  Expected Discharge Plan and Services     Post Acute Care Choice: Skilled Nursing Facility Living arrangements for the past 2 months: Single Family Home                                       Social Determinants of Health (SDOH) Interventions SDOH Screenings   Food Insecurity: No Food Insecurity (02/09/2024)  Housing: Low Risk  (02/09/2024)  Transportation Needs: No Transportation Needs (02/09/2024)  Utilities: Not At Risk (02/09/2024)  Depression (PHQ2-9): Low Risk  (02/08/2020)  Social Connections: Socially Isolated (02/11/2024)  Tobacco Use: Low Risk  (02/07/2024)    Readmission Risk Interventions    01/15/2022    2:56 PM 01/02/2022    2:03 PM 12/31/2021   10:32 AM  Readmission Risk Prevention Plan  Transportation Screening Complete Complete Complete  Medication Review Oceanographer) Complete Complete Complete  PCP or Specialist appointment within 3-5 days of discharge Complete Complete Complete  HRI or Home Care Consult Complete Complete Complete  SW Recovery Care/Counseling Consult Complete Complete Complete  Palliative Care Screening Not Applicable Not Applicable Not Applicable  Skilled Nursing Facility Not Applicable Not Applicable Not Applicable

## 2024-02-17 ENCOUNTER — Inpatient Hospital Stay (HOSPITAL_COMMUNITY): Admitting: Certified Registered"

## 2024-02-17 ENCOUNTER — Encounter (HOSPITAL_COMMUNITY)
Admission: EM | Disposition: A | Discharge status: Skilled Nursing Facility | Payer: Self-pay | Source: Home / Self Care | Attending: Internal Medicine

## 2024-02-17 ENCOUNTER — Other Ambulatory Visit: Payer: Self-pay

## 2024-02-17 DIAGNOSIS — I10 Essential (primary) hypertension: Secondary | ICD-10-CM | POA: Diagnosis not present

## 2024-02-17 DIAGNOSIS — M869 Osteomyelitis, unspecified: Secondary | ICD-10-CM

## 2024-02-17 DIAGNOSIS — E119 Type 2 diabetes mellitus without complications: Secondary | ICD-10-CM | POA: Diagnosis not present

## 2024-02-17 DIAGNOSIS — M4624 Osteomyelitis of vertebra, thoracic region: Secondary | ICD-10-CM | POA: Diagnosis not present

## 2024-02-17 DIAGNOSIS — E1169 Type 2 diabetes mellitus with other specified complication: Secondary | ICD-10-CM

## 2024-02-17 DIAGNOSIS — N186 End stage renal disease: Secondary | ICD-10-CM

## 2024-02-17 DIAGNOSIS — R571 Hypovolemic shock: Secondary | ICD-10-CM | POA: Diagnosis not present

## 2024-02-17 DIAGNOSIS — M86272 Subacute osteomyelitis, left ankle and foot: Secondary | ICD-10-CM | POA: Diagnosis not present

## 2024-02-17 DIAGNOSIS — I132 Hypertensive heart and chronic kidney disease with heart failure and with stage 5 chronic kidney disease, or end stage renal disease: Secondary | ICD-10-CM | POA: Diagnosis not present

## 2024-02-17 DIAGNOSIS — E039 Hypothyroidism, unspecified: Secondary | ICD-10-CM | POA: Diagnosis not present

## 2024-02-17 DIAGNOSIS — E1122 Type 2 diabetes mellitus with diabetic chronic kidney disease: Secondary | ICD-10-CM | POA: Diagnosis not present

## 2024-02-17 DIAGNOSIS — I120 Hypertensive chronic kidney disease with stage 5 chronic kidney disease or end stage renal disease: Secondary | ICD-10-CM

## 2024-02-17 LAB — GLUCOSE, CAPILLARY
Glucose-Capillary: 89 mg/dL (ref 70–99)
Glucose-Capillary: 78 mg/dL (ref 70–99)
Glucose-Capillary: 80 mg/dL (ref 70–99)
Glucose-Capillary: 86 mg/dL (ref 70–99)
Glucose-Capillary: 84 mg/dL (ref 70–99)

## 2024-02-17 SURGERY — AMPUTATION, FOOT, PARTIAL
Anesthesia: Monitor Anesthesia Care | Site: Foot | Laterality: Left

## 2024-02-17 MED ORDER — HYDROMORPHONE HCL 1 MG/ML IJ SOLN
0.5000 mg | INTRAMUSCULAR | Status: DC | PRN
  Administered 2024-02-17 – 2024-02-18 (×5): 0.5 mg via INTRAVENOUS
  Filled 2024-02-17 (×5): qty 0.5

## 2024-02-17 MED ORDER — MIDODRINE HCL 5 MG PO TABS
10.0000 mg | ORAL_TABLET | Freq: Once | ORAL | Status: AC
  Administered 2024-02-17: 10 mg via ORAL
  Filled 2024-02-17: qty 2

## 2024-02-17 MED ORDER — MIDODRINE HCL 5 MG PO TABS: 5.0000 mg | ORAL_TABLET | Freq: Once | ORAL | Status: DC

## 2024-02-17 MED ORDER — FENTANYL CITRATE (PF) 100 MCG/2ML IJ SOLN
INTRAMUSCULAR | Status: AC
  Filled 2024-02-17: qty 2

## 2024-02-17 MED ORDER — SODIUM CHLORIDE 0.9 % IV SOLN: INTRAVENOUS | Status: DC

## 2024-02-17 MED ORDER — CHLORHEXIDINE GLUCONATE 0.12 % MT SOLN: 15.0000 mL | Freq: Once | OROMUCOSAL | Status: AC

## 2024-02-17 MED ORDER — CHLORHEXIDINE GLUCONATE 0.12 % MT SOLN
OROMUCOSAL | Status: AC
  Administered 2024-02-17: 15 mL via OROMUCOSAL
  Filled 2024-02-17: qty 15

## 2024-02-17 MED ORDER — ZINC SULFATE 220 (50 ZN) MG PO CAPS
220.0000 mg | ORAL_CAPSULE | Freq: Every day | ORAL | Status: AC
  Administered 2024-02-17 – 2024-02-29 (×11): 220 mg via ORAL
  Filled 2024-02-17 (×11): qty 1

## 2024-02-17 MED ORDER — SODIUM CHLORIDE 0.9 % IV BOLUS
250.0000 mL | Freq: Once | INTRAVENOUS | Status: AC
  Administered 2024-02-17: 250 mL via INTRAVENOUS

## 2024-02-17 MED ORDER — PROPOFOL 500 MG/50ML IV EMUL
INTRAVENOUS | Status: DC | PRN
  Administered 2024-02-17: 100 ug/kg/min via INTRAVENOUS

## 2024-02-17 MED ORDER — ACETAMINOPHEN 10 MG/ML IV SOLN: 1000.0000 mg | Freq: Once | INTRAVENOUS | Status: DC | PRN

## 2024-02-17 MED ORDER — PHENYLEPHRINE HCL-NACL 20-0.9 MG/250ML-% IV SOLN
INTRAVENOUS | Status: DC | PRN
Start: 2024-02-17 — End: 2024-02-17
  Administered 2024-02-17: 25 ug/min via INTRAVENOUS

## 2024-02-17 MED ORDER — HYDROMORPHONE HCL 1 MG/ML IJ SOLN
0.5000 mg | Freq: Once | INTRAMUSCULAR | Status: AC
  Administered 2024-02-17: 0.5 mg via INTRAVENOUS
  Filled 2024-02-17: qty 0.5

## 2024-02-17 MED ORDER — VASHE WOUND IRRIGATION OPTIME
TOPICAL | Status: DC | PRN
  Administered 2024-02-17: 34 [oz_av] via TOPICAL

## 2024-02-17 MED ORDER — ORAL CARE MOUTH RINSE: 15.0000 mL | Freq: Once | OROMUCOSAL | Status: AC

## 2024-02-17 MED ORDER — JUVEN PO PACK
1.0000 | PACK | Freq: Two times a day (BID) | ORAL | Status: DC
  Administered 2024-02-18 – 2024-03-16 (×36): 1 via ORAL
  Filled 2024-02-17 (×33): qty 1

## 2024-02-17 MED ORDER — FENTANYL CITRATE (PF) 100 MCG/2ML IJ SOLN
INTRAMUSCULAR | Status: DC | PRN
  Administered 2024-02-17: 100 ug via INTRAVENOUS

## 2024-02-17 MED ORDER — VITAMIN C 500 MG PO TABS
1000.0000 mg | ORAL_TABLET | Freq: Every day | ORAL | Status: DC
  Administered 2024-02-17 – 2024-03-16 (×26): 1000 mg via ORAL
  Filled 2024-02-17 (×27): qty 2

## 2024-02-17 MED ORDER — VANCOMYCIN HCL 1000 MG IV SOLR
INTRAVENOUS | Status: AC
  Filled 2024-02-17: qty 20

## 2024-02-17 MED ORDER — SODIUM CHLORIDE 0.9% FLUSH: 3.0000 mL | Freq: Two times a day (BID) | INTRAVENOUS | Status: DC

## 2024-02-17 MED ORDER — DEXAMETHASONE SODIUM PHOSPHATE 10 MG/ML IJ SOLN
INTRAMUSCULAR | Status: DC | PRN
  Administered 2024-02-17: 10 mg

## 2024-02-17 MED ORDER — CHLORHEXIDINE GLUCONATE CLOTH 2 % EX PADS
6.0000 | MEDICATED_PAD | Freq: Every day | CUTANEOUS | Status: DC
  Administered 2024-02-18: 6 via TOPICAL

## 2024-02-17 MED ORDER — FENTANYL CITRATE (PF) 100 MCG/2ML IJ SOLN
25.0000 ug | INTRAMUSCULAR | Status: DC | PRN
  Administered 2024-02-17 (×2): 50 ug via INTRAVENOUS

## 2024-02-17 MED ORDER — SODIUM CHLORIDE 0.9 % IV SOLN
INTRAVENOUS | Status: DC | PRN
Start: 2024-02-17 — End: 2024-02-17

## 2024-02-17 MED ORDER — 0.9 % SODIUM CHLORIDE (POUR BTL) OPTIME
TOPICAL | Status: DC | PRN
  Administered 2024-02-17: 1000 mL

## 2024-02-17 MED ORDER — ROPIVACAINE HCL 5 MG/ML IJ SOLN
INTRAMUSCULAR | Status: DC | PRN
  Administered 2024-02-17: 40 mL via PERINEURAL

## 2024-02-17 MED ORDER — SODIUM CHLORIDE 0.9% FLUSH: 3.0000 mL | INTRAVENOUS | Status: DC | PRN

## 2024-02-17 MED ORDER — PROPOFOL 10 MG/ML IV BOLUS
INTRAVENOUS | Status: DC | PRN
  Administered 2024-02-17: 40 mg via INTRAVENOUS

## 2024-02-17 MED ORDER — ALBUMIN HUMAN 5 % IV SOLN: INTRAVENOUS | Status: DC | PRN

## 2024-02-17 SURGICAL SUPPLY — 33 items
BAG COUNTER SPONGE SURGICOUNT (BAG) ×1 IMPLANT
BENZOIN TINCTURE PRP APPL 2/3 (GAUZE/BANDAGES/DRESSINGS) ×1 IMPLANT
BLADE SAW SGTL HD 18.5X60.5X1. (BLADE) ×1 IMPLANT
BLADE SURG 21 STRL SS (BLADE) ×1 IMPLANT
BNDG COHESIVE 4X5 TAN STRL LF (GAUZE/BANDAGES/DRESSINGS) IMPLANT
BNDG GAUZE DERMACEA FLUFF 4 (GAUZE/BANDAGES/DRESSINGS) IMPLANT
CANISTER WOUND CARE 500ML ATS (WOUND CARE) IMPLANT
CLEANSER WND VASHE 34 (WOUND CARE) IMPLANT
COVER SURGICAL LIGHT HANDLE (MISCELLANEOUS) ×1 IMPLANT
DRAPE INCISE IOBAN 66X45 STRL (DRAPES) ×1 IMPLANT
DRAPE U-SHAPE 47X51 STRL (DRAPES) ×1 IMPLANT
DRSG ADAPTIC 3X8 NADH LF (GAUZE/BANDAGES/DRESSINGS) IMPLANT
DRSG CURAD 3X16 NADH (PACKING) IMPLANT
DURAPREP 26ML APPLICATOR (WOUND CARE) ×1 IMPLANT
ELECTRODE REM PT RTRN 9FT ADLT (ELECTROSURGICAL) ×1 IMPLANT
GAUZE PAD ABD 8X10 STRL (GAUZE/BANDAGES/DRESSINGS) IMPLANT
GAUZE SPONGE 4X4 12PLY STRL (GAUZE/BANDAGES/DRESSINGS) IMPLANT
GLOVE BIOGEL PI IND STRL 9 (GLOVE) ×1 IMPLANT
GLOVE SURG ORTHO 9.0 STRL STRW (GLOVE) ×1 IMPLANT
GOWN STRL REUS W/ TWL XL LVL3 (GOWN DISPOSABLE) ×3 IMPLANT
GRAFT SKIN WND MICRO 38 (Tissue) IMPLANT
KIT BASIN OR (CUSTOM PROCEDURE TRAY) ×1 IMPLANT
KIT TURNOVER KIT B (KITS) ×1 IMPLANT
NS IRRIG 1000ML POUR BTL (IV SOLUTION) ×1 IMPLANT
PACK ORTHO EXTREMITY (CUSTOM PROCEDURE TRAY) ×1 IMPLANT
PAD ARMBOARD POSITIONER FOAM (MISCELLANEOUS) ×2 IMPLANT
SPONGE T-LAP 18X18 ~~LOC~~+RFID (SPONGE) IMPLANT
SUT ETHILON 2 0 PSLX (SUTURE) ×2 IMPLANT
TOWEL GREEN STERILE (TOWEL DISPOSABLE) ×1 IMPLANT
TOWEL GREEN STERILE FF (TOWEL DISPOSABLE) ×1 IMPLANT
TUBE CONNECTING 12X1/4 (SUCTIONS) ×1 IMPLANT
WATER STERILE IRR 1000ML POUR (IV SOLUTION) ×1 IMPLANT
YANKAUER SUCT BULB TIP NO VENT (SUCTIONS) ×1 IMPLANT

## 2024-02-17 NOTE — Progress Notes (Addendum)
 PROGRESS NOTE        PATIENT DETAILS Name: Thomas [name] Age: 73 y.o. Sex: male Date of Birth: [birth year] Admit Date: 02/07/2024 Admitting Physician Roxana Copier, DO BJY:NWGNFA, [name], MD  Brief Summary: Patient is a 73 y.o.  male who with history of ESRD on HD TTS, history of posterior fusion at T10-12 due to osteomyelitis/discitis/epidural abscess in 2021, history of right BKA who presented with low back pain-upon further evaluation with CT imaging-he was found to have potential acute osteomyelitis involving his thoracic spine and admitted to the hospitalist service.  Significant events: 4/20>> admit to TRH-back pain-concern for thoracic osteomyelitis.  Significant studies: 4/21>> x-ray right elbow: No fracture. 4/21>> x-ray left wrist: No fracture 4/21>> x-ray left shoulder, no fracture. 4/21>> CT head: No acute intracranial abnormality 4/21>> CT C-spine: No fracture. 4/21>> CT abdomen/pelvis: No acute injury to chest/abdomen/pelvis 4/21>> CT T-spine: Prior posterior fusion from T10-T12-abnormal appearance with collapse of T10-lucency around T10 screws at the T9-10 disc space, T10-11 disc space concerning for discitis/osteomyelitis. 4/21>> CT L-spine: No acute bony abnormality, mild chronic compression fracture at L1. 4/22>> x-ray left foot: Chronic erosive changes about the forefoot similar to 11/21/2023-osteomyelitis/septic arthritis of the 1st/2nd MTP joints difficult to exclude. 4/25>> left ABI: Within normal limit.  Significant microbiology data: 4/22>> blood culture: No growth  Procedures: None  Consults: Neurosurgery Infectious disease Ortho  Subjective: Hypotensive earlier this morning-responded to IV fluids.  No other complaints apart from ongoing pain in his back.  Objective: Vitals: Blood pressure 104/67, pulse (!) 57, temperature 98.2 F (36.8 C), temperature source Axillary, resp. rate 12, height 6\' 5"  (1.956 m), weight 92 kg,  SpO2 96%.   Exam: Awake/alert Abdomen: Soft nontender nondistended   Pertinent Labs/Radiology:    Latest Ref Rng & Units 02/16/2024    4:06 AM 02/14/2024    6:12 AM 02/11/2024    4:24 AM  CBC  WBC 4.0 - 10.5 K/uL 5.4  6.0  5.5   Hemoglobin 13.0 - 17.0 g/dL 21.3  08.6  9.0   Hematocrit 39.0 - 52.0 % 31.1  31.4  29.0   Platelets 150 - 400 K/uL 155  142  147     Lab Results  Component Value Date   NA 136 02/16/2024   K 5.9 (H) 02/16/2024   CL 100 02/16/2024   CO2 23 02/16/2024    Assessment/Plan: Acute osteomyelitis with discitis involving T9 to T11 vertebra Continues to have back pain-exam is stable. Per neurosurgery not an operative candidate Per IR-no window to aspirate Plan is for 6 weeks of vancomycin /cefepime  with hemodialysis-EOT 6/4 Blood cultures negative so far  Possible osteomyelitis of left foot involving 1st/2nd MTP joints. Chronic left second toe ulceration Intolerant to MRI in the past-not keen on getting one He just completed 6 weeks of daptomycin /cefepime  on 3/28-has refused amputation.  Concern is that he may have seeded his spine through foot infection. Evaluated by Dr. Julio Ohm on 4/28-TMA planned on 4/30.  Transient hypotension on 4/30 Likely secondary to antihypertensives-and hemodialysis yesterday evening He was asymptomatic. All antihypertensives held-midodrine x 1-IVF bolus-blood pressure now normalizing. Reassess 5/1 to see if antihypertensives can be resumed.  Mechanical fall Numerous imaging studies negative for fracture PT/OT eval.  ESRD on HD TTS Nephrology following and directing HD care  Hyperkalemia Treated with HD on 4/29 Repeat electrolytes  Prolonged QTc Twelve-lead  EKG on 4/30 with normal QTc Telemetry monitoring Repeat EKG periodically.  Normocytic anemia Secondary to ESRD Aranesp /iron  deferred to nephrology service.  Chronic HFpEF Euvolemic on exam Fluid removal with HD  HTN Hypotensive this morning-see above-all  antihypertensives held.  HLD Statin  Hypothyroidism Synthroid   BPH Flomax /bethanechol   History of right BKA  BMI: Estimated body mass index is 24.05 kg/m as calculated from the following:   Height as of this encounter: 6\' 5"  (1.956 m).   Weight as of this encounter: 92 kg.   Code status:   Code Status: Full Code   DVT Prophylaxis: heparin  injection 5,000 Units Start: 02/10/24 1400 SCDs Start: 02/08/24 2206   Family Communication: None at bedside   Disposition Plan: Status is: Inpatient Remains inpatient appropriate because: Severity of illness   Planned Discharge Destination:Home health   Diet: Diet Order             Diet NPO time specified Except for: Sips with Meds  Diet effective ____                     Antimicrobial agents: Anti-infectives (From admission, onward)    Start     Dose/Rate Route Frequency Ordered Stop   02/11/24 1800  ceFEPIme  (MAXIPIME ) 2 g in sodium chloride  0.9 % 100 mL IVPB        2 g 200 mL/hr over 30 Minutes Intravenous Every T-Th-Sa (Hemodialysis) 02/10/24 1028     02/11/24 1200  vancomycin  (VANCOCIN ) IVPB 1000 mg/200 mL premix        1,000 mg 200 mL/hr over 60 Minutes Intravenous Every T-Th-Sa (Hemodialysis) 02/10/24 1028     02/11/24 0000  ceFEPime  (MAXIPIME ) IVPB        2 g Intravenous Every T-Th-Sa (Hemodialysis) 02/11/24 0747 03/23/24 2359   02/11/24 0000  vancomycin  IVPB        1,000 mg Intravenous Every T-Th-Sa (Hemodialysis) 02/11/24 0747 03/23/24 2359   02/10/24 1115  vancomycin  (VANCOREADY) IVPB 2000 mg/400 mL        2,000 mg 200 mL/hr over 120 Minutes Intravenous  Once 02/10/24 1028 02/10/24 1446   02/10/24 1115  ceFEPIme  (MAXIPIME ) 2 g in sodium chloride  0.9 % 100 mL IVPB        2 g 200 mL/hr over 30 Minutes Intravenous  Once 02/10/24 1028 02/10/24 1239        MEDICATIONS: Scheduled Meds:  atorvastatin   80 mg Oral Daily   bethanechol   10 mg Oral BID   Chlorhexidine  Gluconate Cloth  6 each Topical  Q0600   Chlorhexidine  Gluconate Cloth  6 each Topical Q0600   darbepoetin (ARANESP ) injection - DIALYSIS  100 mcg Subcutaneous Q Sat-1800   heparin  injection (subcutaneous)  5,000 Units Subcutaneous Q8H   leptospermum manuka honey  1 Application Topical Daily   levothyroxine   50 mcg Oral QAC breakfast   polyethylene glycol  17 g Oral BID   senna-docusate  4 tablet Oral BID   sevelamer  carbonate  1,600 mg Oral TID WC   tamsulosin   0.4 mg Oral Daily   Continuous Infusions:  ceFEPime  (MAXIPIME ) IV 2 g (02/16/24 1755)   promethazine  (PHENERGAN ) injection (IM or IVPB)     vancomycin  Stopped (02/16/24 1251)   PRN Meds:.acetaminophen  **OR** acetaminophen , HYDROmorphone  (DILAUDID ) injection, melatonin, naLOXone  (NARCAN )  injection, ondansetron  (ZOFRAN ) IV, oxyCODONE , promethazine  (PHENERGAN ) injection (IM or IVPB), tiZANidine    I have personally reviewed following labs and imaging studies  LABORATORY DATA: CBC: Recent Labs  Lab 02/11/24 0424 02/14/24  1610 02/16/24 0406  WBC 5.5 6.0 5.4  HGB 9.0* 10.2* 10.2*  HCT 29.0* 31.4* 31.1*  MCV 107.8* 105.4* 104.0*  PLT 147* 142* 155    Basic Metabolic Panel: Recent Labs  Lab 02/11/24 0424 02/12/24 0505 02/13/24 1343 02/16/24 0406  NA 136 133* 136 136  K 5.2* 4.8 4.6 5.9*  CL 99 98 99 100  CO2 21* 21* 22 23  GLUCOSE 94 98 125* 87  BUN 47* 34* 57* 62*  CREATININE 7.62* 5.70* 7.31* 8.09*  CALCIUM  7.7* 8.1* 8.1* 8.6*  PHOS 6.8* 5.1*  --  7.3*    GFR: Estimated Creatinine Clearance: 10.7 mL/min (A) (by C-G formula based on SCr of 8.09 mg/dL (H)).  Liver Function Tests: Recent Labs  Lab 02/11/24 0424 02/12/24 0505 02/16/24 0406  ALBUMIN  3.0* 3.4* 3.2*   No results for input(s): "LIPASE", "AMYLASE" in the last 168 hours. No results for input(s): "AMMONIA" in the last 168 hours.  Coagulation Profile: No results for input(s): "INR", "PROTIME" in the last 168 hours.   Cardiac Enzymes: No results for input(s): "CKTOTAL",  "CKMB", "CKMBINDEX", "TROPONINI" in the last 168 hours.  BNP (last 3 results) No results for input(s): "PROBNP" in the last 8760 hours.  Lipid Profile: No results for input(s): "CHOL", "HDL", "LDLCALC", "TRIG", "CHOLHDL", "LDLDIRECT" in the last 72 hours.  Thyroid Function Tests: No results for input(s): "TSH", "T4TOTAL", "FREET4", "T3FREE", "THYROIDAB" in the last 72 hours.  Anemia Panel: No results for input(s): "VITAMINB12", "FOLATE", "FERRITIN", "TIBC", "IRON ", "RETICCTPCT" in the last 72 hours.   Urine analysis:    Component Value Date/Time   COLORURINE YELLOW 01/14/2022 0248   APPEARANCEUR HAZY (A) 01/14/2022 0248   LABSPEC 1.009 01/14/2022 0248   PHURINE 7.0 01/14/2022 0248   GLUCOSEU NEGATIVE 01/14/2022 0248   HGBUR MODERATE (A) 01/14/2022 0248   BILIRUBINUR NEGATIVE 01/14/2022 0248   KETONESUR NEGATIVE 01/14/2022 0248   PROTEINUR 100 (A) 01/14/2022 0248   NITRITE NEGATIVE 01/14/2022 0248   LEUKOCYTESUR LARGE (A) 01/14/2022 0248    Sepsis Labs: Lactic Acid, Venous    Component Value Date/Time   LATICACIDVEN 1.0 12/31/2021 0048    MICROBIOLOGY: Recent Results (from the past 240 hours)  Culture, blood (Routine X 2) w Reflex to ID Panel     Status: None   Collection Time: 02/09/24  4:06 AM   Specimen: BLOOD  Result Value Ref Range Status   Specimen Description BLOOD BLOOD RIGHT HAND  Final   Special Requests   Final    BOTTLES DRAWN AEROBIC ONLY Blood Culture adequate volume   Culture   Final    NO GROWTH 5 DAYS Performed at [facility], [address], [place], [place] [address]    Report Status 02/14/2024 FINAL  Final  Culture, blood (Routine X 2) w Reflex to ID Panel     Status: None   Collection Time: 02/09/24  4:14 AM   Specimen: BLOOD  Result Value Ref Range Status   Specimen Description BLOOD BLOOD RIGHT HAND AEROBIC BOTTLE ONLY  Final   Special Requests   Final    BOTTLES DRAWN AEROBIC ONLY Blood Culture adequate volume   Culture   Final     NO GROWTH 5 DAYS Performed at [facility], [address], [place], [place] [address]    Report Status 02/14/2024 FINAL  Final    RADIOLOGY STUDIES/RESULTS: No results found.     LOS: 9 days   Kimberly Penna, MD  [facility]  To contact the attending provider between 7A-7P or the covering provider during after hours 7P-7A, please log into the web site [email or web address] and access using universal [place] password for that web site. If you do not have the password, please call the hospital operator.  02/17/2024, 10:58 AM

## 2024-02-17 NOTE — Transfer of Care (Signed)
 Immediate Anesthesia Transfer of Care Note  Patient: Thomas [name]  Procedure(s) Performed: AMPUTATION, FOOT, PARTIAL (Left: Foot)  Patient Location: PACU  Anesthesia Type:MAC  Level of Consciousness: awake, alert , and oriented  Airway & Oxygen Therapy: Patient Spontanous Breathing  Post-op Assessment: Report given to RN and Post -op Vital signs reviewed and stable  Post vital signs: Reviewed and stable  Last Vitals:  Vitals Value Taken Time  BP 125/49 02/17/24 1447  Temp    Pulse 56 02/17/24 1452  Resp 12 02/17/24 1452  SpO2 100 % 02/17/24 1452  Vitals shown include unfiled device data.  Last Pain:  Vitals:   02/17/24 1331  TempSrc:   PainSc: 7       Patients Stated Pain Goal: 0 (02/16/24 0556)  Complications: No notable events documented.

## 2024-02-17 NOTE — TOC Progression Note (Addendum)
 Transition of Care [facility]) - Progression Note    Patient Details  Name: Thomas [name] MRN: [id number] Date of Birth: [birth year]  Transition of Care [facility]) CM/SW Contact  Jannice Mends, LCSW Phone Number: 02/17/2024, 9:05 AM  Clinical Narrative:    9:05 AM-CSW left voicemail for [facility] to see if they accept Dialysis patients.  10am-[facility] returned call and stated they do not transport patient to dialysis.  10:33 AM-CSW met with patient and provided SNF bed offers outside [facility]. Patient expressed disatisfaction eith the facility ratings and stated he does not want to go to one of those "hellhole" and states he is going home at discharge. CSW expressed concern over patient not having any help in the home and he stated he does not need help and has someone take him to the grocery store when he needs to. CSW left SNF list at bedside in the event patient changes his mind.   Expected Discharge Plan: Skilled Nursing Facility Barriers to Discharge: Continued Medical Work up, English as a second language teacher, SNF Pending bed offer  Expected Discharge Plan and Services     Post Acute Care Choice: Skilled Nursing Facility Living arrangements for the past 2 months: Single Family Home                                       Social Determinants of Health (SDOH) Interventions SDOH Screenings   Food Insecurity: No Food Insecurity (02/09/2024)  Housing: Low Risk  (02/09/2024)  Transportation Needs: No Transportation Needs (02/09/2024)  Utilities: Not At Risk (02/09/2024)  Depression (PHQ2-9): Low Risk  (02/08/2020)  Social Connections: Socially Isolated (02/11/2024)  Tobacco Use: Low Risk  (02/07/2024)    Readmission Risk Interventions    01/15/2022    2:56 PM 01/02/2022    2:03 PM 12/31/2021   10:32 AM  Readmission Risk Prevention Plan  Transportation Screening Complete Complete Complete  Medication Review Oceanographer) Complete Complete Complete  PCP or Specialist  appointment within 3-5 days of discharge Complete Complete Complete  HRI or Home Care Consult Complete Complete Complete  SW Recovery Care/Counseling Consult Complete Complete Complete  Palliative Care Screening Not Applicable Not Applicable Not Applicable  Skilled Nursing Facility Not Applicable Not Applicable Not Applicable

## 2024-02-17 NOTE — Interval H&P Note (Signed)
 History and Physical Interval Note:  02/17/2024 6:45 AM  Thomas [name]  has presented today for surgery, with the diagnosis of Osteomyelitis Left Foot.  The various methods of treatment have been discussed with the patient and family. After consideration of risks, benefits and other options for treatment, the patient has consented to  Procedure(s) with comments: AMPUTATION, FOOT, PARTIAL (Left) - LEFT TRANSMETATARSAL AMPUTATION as a surgical intervention.  The patient's history has been reviewed, patient examined, no change in status, stable for surgery.  I have reviewed the patient's chart and labs.  Questions were answered to the patient's satisfaction.     Caera Enwright V Nichol Ator

## 2024-02-17 NOTE — Anesthesia Procedure Notes (Signed)
 Anesthesia Regional Block: Popliteal block   Pre-Anesthetic Checklist: , timeout performed,  Correct Patient, Correct Site, Correct Laterality,  Correct Procedure, Correct Position, site marked,  Risks and benefits discussed,  Surgical consent,  Pre-op evaluation,  At surgeon's request and post-op pain management  Laterality: Left  Prep: Dura Prep       Needles:  Injection technique: Single-shot  Needle Type: Echogenic Stimulator Needle     Needle Length: 10cm  Needle Gauge: 20     Additional Needles:   Procedures:,,,, ultrasound used (permanent image in chart),,    Narrative:  Start time: 02/17/2024 1:34 PM End time: 02/17/2024 1:37 PM Injection made incrementally with aspirations every 5 mL.  Performed by: Personally  Anesthesiologist: Micheal Agent, DO  Additional Notes: Patient identified. Risks/Benefits/Options discussed with patient including but not limited to bleeding, infection, nerve damage, failed block, incomplete pain control. Patient expressed understanding and wished to proceed. All questions were answered. Sterile technique was used throughout the entire procedure. Please see nursing notes for vital signs. Aspirated in 5cc intervals with injection for negative confirmation. Patient was given instructions on fall risk and not to get out of bed. All questions and concerns addressed with instructions to call with any issues or inadequate analgesia.

## 2024-02-17 NOTE — Op Note (Signed)
 02/07/2024 - 02/17/2024  2:37 PM  PATIENT:  Thomas [name]    PRE-OPERATIVE DIAGNOSIS:  Osteomyelitis Left Foot  POST-OPERATIVE DIAGNOSIS:  Same  PROCEDURE:  AMPUTATION, left transmetatarsal amputation. Application of Kerecis micro graft 38 cm.  SURGEON:  Timothy Ford, MD  PHYSICIAN ASSISTANT: April Green ANESTHESIA:   General  PREOPERATIVE INDICATIONS:  [name] is a  73 y.o. male with a diagnosis of Osteomyelitis Left Foot who failed conservative measures and elected for surgical management.    The risks benefits and alternatives were discussed with the patient preoperatively including but not limited to the risks of infection, bleeding, nerve injury, cardiopulmonary complications, the need for revision surgery, among others, and the patient was willing to proceed.  OPERATIVE IMPLANTS:   Implant Name Type Inv. Item Serial No. Manufacturer Lot No. LRB No. Used Action  GRAFT SKIN WND MICRO 38 - [id number] Tissue GRAFT SKIN WND MICRO 38  [employer] [id number] Left 1 Implanted    @ENCIMAGES @  OPERATIVE FINDINGS: Patient had calcified vessels.  There was good petechial bleeding.  Tissue margins were clear.  OPERATIVE PROCEDURE: Patient was brought the operating room after undergoing an regional anesthetic.  After adequate levels anesthesia were obtained patient's left lower extremity was prepped using DuraPrep draped into a sterile field a timeout was called.  A fishmouth incision was made proximal to the ulcerative tissue.  This was carried sharply down to bone.  Oscillating saw was used to perform a transmetatarsal amputation.  Electrocautery was used for hemostasis.  The wound was irrigated with Vashe.  The tissue was reinforced with 38 cm of Kerecis micro graft.  The incision was closed using 2-0 nylon a sterile dressing was applied patient was taken the PACU in stable condition.   DISCHARGE PLANNING:  Antibiotic duration: No further antibiotics needed for the foot.   Continue antibiotics as per other sites of infection.  Weightbearing: Touchdown weightbearing on the left  Pain medication: Continue current opioid pathway  Dressing care/ Wound VAC: Dry dressing reinforce as needed  Ambulatory devices: Walker  Discharge to: Discharge planning based on therapy recommendations.  Follow-up: In the office 1 week post operative.

## 2024-02-17 NOTE — Progress Notes (Signed)
 Orthopedic Tech Progress Note Patient Details:  Thomas [name] 1950-11-25 [id number]  Ortho Devices Type of Ortho Device: Postop shoe/boot Ortho Device/Splint Location: RLE Ortho Device/Splint Interventions: Ordered, Application, Adjustment, Removal   Post Interventions Patient Tolerated: Well Instructions Provided: Care of device  Thomas [name] 02/17/2024, 5:54 PM

## 2024-02-17 NOTE — Progress Notes (Addendum)
 Overnight - Developing hypotension 80/50s. Was hypertensive even after HD earlier, had 700 mL removed via UF. Suspect dry and more sensitive to antihypertensives. Will start with 250 cc IVF and repeat pressures, additional 250 cc aliquots if needed. Have temporarily discontinued his Hydralazine , Clonidine , Losartan , Amlodipine , Metoprolol . Will need reassessment to resume these when appropriate, attention to Clonidine  to avoid rebound.  Considering he is preop prefer higher pressures, will give midodrine 5 mg x 1 this am   Arnulfo Larch, MD  [facility]

## 2024-02-17 NOTE — Anesthesia Postprocedure Evaluation (Signed)
 Anesthesia Post Note  Patient: Thomas [name]  Procedure(s) Performed: AMPUTATION, FOOT, PARTIAL (Left: Foot)     Patient location during evaluation: PACU Anesthesia Type: Regional and MAC Level of consciousness: awake and alert Pain management: pain level controlled Vital Signs Assessment: post-procedure vital signs reviewed and stable Respiratory status: spontaneous breathing, nonlabored ventilation, respiratory function stable and patient connected to nasal cannula oxygen Cardiovascular status: stable and blood pressure returned to baseline Postop Assessment: no apparent nausea or vomiting Anesthetic complications: no   No notable events documented.  Last Vitals:  Vitals:   02/17/24 1515 02/17/24 1538  BP: 135/70 (!) 162/71  Pulse: (!) 52 (!) 53  Resp: 12 12  Temp: 36.4 C 36.6 C  SpO2: 99% 94%    Last Pain:  Vitals:   02/17/24 1538  TempSrc: Oral  PainSc:                  Valente Gaskin Nakeisha Greenhouse

## 2024-02-17 NOTE — Anesthesia Preprocedure Evaluation (Addendum)
 Anesthesia Evaluation  Patient identified by MRN, date of birth, ID band Patient awake    Reviewed: Allergy & Precautions, NPO status , Patient's Chart, lab work & pertinent test results  Airway Mallampati: II       Dental no notable dental hx. (+) Edentulous Upper, Edentulous Lower   Pulmonary neg pulmonary ROS   Pulmonary exam normal        Cardiovascular hypertension, Pt. on medications and Pt. on home beta blockers +CHF   Rhythm:Regular Rate:Normal     Neuro/Psych negative neurological ROS  negative psych ROS   GI/Hepatic Neg liver ROS,GERD  ,,  Endo/Other  diabetesHypothyroidism    Renal/GU ESRF and DialysisRenal disease  negative genitourinary   Musculoskeletal  (+) Arthritis ,    Abdominal Normal abdominal exam  (+)   Peds  Hematology Lab Results      Component                Value               Date                      WBC                      5.4                 02/16/2024                HGB                      10.2 (L)            02/16/2024                HCT                      31.1 (L)            02/16/2024                MCV                      104.0 (H)           02/16/2024                PLT                      155                 02/16/2024             Lab Results      Component                Value               Date                      NA                       136                 02/16/2024                K  5.9 (H)             02/16/2024                CO2                      23                  02/16/2024                GLUCOSE                  87                  02/16/2024                BUN                      62 (H)              02/16/2024                CREATININE               8.09 (H)            02/16/2024                CALCIUM                   8.6 (L)             02/16/2024                GFRNONAA                 7 (L)               02/16/2024               Anesthesia Other Findings   Reproductive/Obstetrics                             Anesthesia Physical Anesthesia Plan  ASA: 3  Anesthesia Plan: MAC and Regional   Post-op Pain Management: Regional block*   Induction: Intravenous  PONV Risk Score and Plan: 1 and Ondansetron , Dexamethasone , Propofol  infusion and Treatment may vary due to age or medical condition  Airway Management Planned: Simple Face Mask and Nasal Cannula  Additional Equipment: None  Intra-op Plan:   Post-operative Plan:   Informed Consent: I have reviewed the patients History and Physical, chart, labs and discussed the procedure including the risks, benefits and alternatives for the proposed anesthesia with the patient or authorized representative who has indicated his/her understanding and acceptance.     Dental advisory given  Plan Discussed with: CRNA  Anesthesia Plan Comments:        Anesthesia Quick Evaluation

## 2024-02-17 NOTE — Anesthesia Procedure Notes (Signed)
 Anesthesia Regional Block: Adductor canal block   Pre-Anesthetic Checklist: , timeout performed,  Correct Patient, Correct Site, Correct Laterality,  Correct Procedure, Correct Position, site marked,  Risks and benefits discussed,  Surgical consent,  Pre-op evaluation,  At surgeon's request and post-op pain management  Laterality: Left  Prep: Dura Prep       Needles:  Injection technique: Single-shot  Needle Type: Echogenic Stimulator Needle     Needle Length: 10cm  Needle Gauge: 20     Additional Needles:   Procedures:,,,, ultrasound used (permanent image in chart),,    Narrative:  Start time: 02/17/2024 1:30 PM End time: 02/17/2024 1:35 PM Injection made incrementally with aspirations every 5 mL.  Performed by: Personally  Anesthesiologist: Micheal Agent, DO  Additional Notes: Patient identified. Risks/Benefits/Options discussed with patient including but not limited to bleeding, infection, nerve damage, failed block, incomplete pain control. Patient expressed understanding and wished to proceed. All questions were answered. Sterile technique was used throughout the entire procedure. Please see nursing notes for vital signs. Aspirated in 5cc intervals with injection for negative confirmation. Patient was given instructions on fall risk and not to get out of bed. All questions and concerns addressed with instructions to call with any issues or inadequate analgesia.

## 2024-02-17 NOTE — Progress Notes (Signed)
  [facility] Progress Note   Subjective:  Seen in room, in good spirits Going to OR today   Objective Vitals:   02/17/24 1100 02/17/24 1200 02/17/24 1318 02/17/24 1335  BP: 126/72 138/67 (!) 159/80   Pulse: (!) 58 (!) 54 (!) 58 (!) 57  Resp: 14 11 18 12   Temp: 98.4 F (36.9 C)     TempSrc: Oral     SpO2: 97% 98% 100% 93%  Weight:      Height:        Physical Exam General:Chronically ill appearing male, NAD. Room air Heart: RRR; 2/6 murmur Lungs: CTAB Abdomen: soft, ntnd Extremities: R BKA, no LLE edema, dry scabbed skin and bandaged toe Dialysis Access: TDC drsg intact  Dialysis: TTS [place] 4h  B400  92kg  TDC  Heparin  none - Mircera 225mcg IV q 2 weeks (last 4/9) - Calcitriol  0.60mcg PO q HD   Assessment/Plan:  Thoracic discitis/OM: recent acute on chronic back pain, s/p fall on 4/20. CT imaging showed T9-10 discitis. He refused MRI, area too small for aspiration per IR. Earlier this year he completed an 8 week course of Dapto/Cefepime  on 3/20 for bacteremia presumed due to foot infection. Concern this infection may have seeded in spine. Pt is now agreeing to L TMA, orthopedics are planning for today. Now is on IV Vanc/Cefepime  for 6 week course for the discitis. Blood cx's were negative.   ESRD:  Usual TTS schedule. HD today.   HTN/volume: BP ok, no edema. Appears euvolemic, low UF goal next HD d/t severe cramping last HD. Looks dry on exam. RA.   Anemia: HGB 10.2. Follow labs. On weekly ESA. No venofer in setting of infection.  Metabolic bone disease: Ca ok, Phos high - resumed home binders.  Nutrition: Alb 3.4, follow.  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  [name]  MD  [facility] 02/17/2024, 2:33 PM  Recent Labs  Lab 02/12/24 0505 02/13/24 1343 02/14/24 0612 02/16/24 0406  HGB  --   --  10.2* 10.2*  ALBUMIN  3.4*  --   --  3.2*  CALCIUM  8.1* 8.1*  --  8.6*  PHOS 5.1*  --   --  7.3*  CREATININE 5.70* 7.31*  --  8.09*  K 4.8 4.6  --   5.9*

## 2024-02-18 ENCOUNTER — Encounter (HOSPITAL_COMMUNITY): Payer: Self-pay | Admitting: Orthopedic Surgery

## 2024-02-18 DIAGNOSIS — E039 Hypothyroidism, unspecified: Secondary | ICD-10-CM | POA: Diagnosis not present

## 2024-02-18 DIAGNOSIS — I10 Essential (primary) hypertension: Secondary | ICD-10-CM | POA: Diagnosis not present

## 2024-02-18 DIAGNOSIS — E119 Type 2 diabetes mellitus without complications: Secondary | ICD-10-CM | POA: Diagnosis not present

## 2024-02-18 DIAGNOSIS — M4624 Osteomyelitis of vertebra, thoracic region: Secondary | ICD-10-CM | POA: Diagnosis not present

## 2024-02-18 LAB — CBC
HCT: 29.9 % — ABNORMAL LOW (ref 39.0–52.0)
Hemoglobin: 9.6 g/dL — ABNORMAL LOW (ref 13.0–17.0)
MCH: 33.9 pg (ref 26.0–34.0)
MCHC: 32.1 g/dL (ref 30.0–36.0)
MCV: 105.7 fL — ABNORMAL HIGH (ref 80.0–100.0)
Platelets: 151 10*3/uL (ref 150–400)
RBC: 2.83 MIL/uL — ABNORMAL LOW (ref 4.22–5.81)
RDW: 14.1 % (ref 11.5–15.5)
WBC: 6.7 10*3/uL (ref 4.0–10.5)
nRBC: 0 % (ref 0.0–0.2)

## 2024-02-18 LAB — RENAL FUNCTION PANEL
Albumin: 3.4 g/dL — ABNORMAL LOW (ref 3.5–5.0)
Anion gap: 17 — ABNORMAL HIGH (ref 5–15)
BUN: 63 mg/dL — ABNORMAL HIGH (ref 8–23)
CO2: 20 mmol/L — ABNORMAL LOW (ref 22–32)
Calcium: 8.5 mg/dL — ABNORMAL LOW (ref 8.9–10.3)
Chloride: 102 mmol/L (ref 98–111)
Creatinine, Ser: 7.75 mg/dL — ABNORMAL HIGH (ref 0.61–1.24)
GFR, Estimated: 7 mL/min — ABNORMAL LOW (ref >60)
Glucose, Bld: 91 mg/dL (ref 70–99)
Phosphorus: 6.9 mg/dL — ABNORMAL HIGH (ref 2.5–4.6)
Potassium: 5.1 mmol/L (ref 3.5–5.1)
Sodium: 139 mmol/L (ref 135–145)

## 2024-02-18 LAB — GLUCOSE, CAPILLARY
Glucose-Capillary: 110 mg/dL — ABNORMAL HIGH (ref 70–99)
Glucose-Capillary: 103 mg/dL — ABNORMAL HIGH (ref 70–99)

## 2024-02-18 MED ORDER — NEPRO/CARBSTEADY PO LIQD: 237.0000 mL | ORAL | Status: DC | PRN

## 2024-02-18 MED ORDER — PENTAFLUOROPROP-TETRAFLUOROETH EX AERO: 1.0000 | INHALATION_SPRAY | CUTANEOUS | Status: DC | PRN

## 2024-02-18 MED ORDER — BISACODYL 10 MG RE SUPP
10.0000 mg | Freq: Every day | RECTAL | Status: DC | PRN
  Administered 2024-02-19 – 2024-03-15 (×9): 10 mg via RECTAL
  Filled 2024-02-18 (×9): qty 1

## 2024-02-18 MED ORDER — VANCOMYCIN HCL IN DEXTROSE 1-5 GM/200ML-% IV SOLN
INTRAVENOUS | Status: AC
  Filled 2024-02-18: qty 200

## 2024-02-18 MED ORDER — OXYCODONE HCL ER 15 MG PO T12A
30.0000 mg | EXTENDED_RELEASE_TABLET | Freq: Two times a day (BID) | ORAL | Status: DC
  Administered 2024-02-18 (×2): 30 mg via ORAL
  Filled 2024-02-18 (×2): qty 2

## 2024-02-18 MED ORDER — OXYCODONE HCL 5 MG PO TABS
ORAL_TABLET | ORAL | Status: AC
  Filled 2024-02-18: qty 3

## 2024-02-18 MED ORDER — HEPARIN SODIUM (PORCINE) 1000 UNIT/ML DIALYSIS
1000.0000 [IU] | INTRAMUSCULAR | Status: DC | PRN
  Administered 2024-02-18: 3200 [IU]

## 2024-02-18 MED ORDER — HYDROMORPHONE HCL 1 MG/ML IJ SOLN
1.0000 mg | INTRAMUSCULAR | Status: DC | PRN
  Administered 2024-02-18 – 2024-02-20 (×16): 1 mg via INTRAVENOUS
  Filled 2024-02-18 (×15): qty 1

## 2024-02-18 MED ORDER — LIDOCAINE HCL (PF) 1 % IJ SOLN: 5.0000 mL | INTRAMUSCULAR | Status: DC | PRN

## 2024-02-18 MED ORDER — LIDOCAINE-PRILOCAINE 2.5-2.5 % EX CREA: 1.0000 | TOPICAL_CREAM | CUTANEOUS | Status: DC | PRN

## 2024-02-18 MED ORDER — ALTEPLASE 2 MG IJ SOLR: 2.0000 mg | Freq: Once | INTRAMUSCULAR | Status: DC | PRN

## 2024-02-18 MED ORDER — HYDROMORPHONE HCL 1 MG/ML IJ SOLN
0.5000 mg | INTRAMUSCULAR | Status: AC
  Administered 2024-02-18: 0.5 mg via INTRAVENOUS
  Filled 2024-02-18: qty 0.5

## 2024-02-18 MED ORDER — ANTICOAGULANT SODIUM CITRATE 4% (200MG/5ML) IV SOLN: 5.0000 mL | Status: DC | PRN

## 2024-02-18 MED ORDER — AMLODIPINE BESYLATE 5 MG PO TABS
5.0000 mg | ORAL_TABLET | Freq: Every day | ORAL | Status: DC
  Administered 2024-02-18 – 2024-02-25 (×6): 5 mg via ORAL
  Filled 2024-02-18 (×6): qty 1

## 2024-02-18 MED ORDER — CHLORHEXIDINE GLUCONATE CLOTH 2 % EX PADS
6.0000 | MEDICATED_PAD | Freq: Every day | CUTANEOUS | Status: DC
Start: 2024-02-19 — End: 2024-02-19
  Administered 2024-02-19: 6 via TOPICAL

## 2024-02-18 MED ORDER — HEPARIN SODIUM (PORCINE) 1000 UNIT/ML IJ SOLN
INTRAMUSCULAR | Status: AC
Start: 2024-02-18 — End: ?
  Filled 2024-02-18: qty 4

## 2024-02-18 NOTE — Procedures (Signed)
 Patient reports that he wants to get off machine in 25 min. Advised patient that he has a little over an hour left and I need to give him antibiotic and I can't do that until the last hour of treatment. Patient still reports that he wants to get off the machine in 25 min.

## 2024-02-18 NOTE — Progress Notes (Addendum)
 Patient ID: Thomas [name], male   DOB: Oct 02, 1951, 73 y.o.   MRN: [id number] Patient is postoperative day 1 left transmetatarsal amputation.  Patient states his foot is not bothering him.  He states he has mostly neck and back pain.  Patient request a regular diet, patient is on a regular diet.

## 2024-02-18 NOTE — Procedures (Signed)
 C/o cramping, UF placed in pause even though goal is 0. 100 ml NS bolus given.

## 2024-02-18 NOTE — Progress Notes (Addendum)
  [facility] Progress Note   Subjective:  Seen in room, in good spirits Had L TMA surgery yesterday  Objective Vitals:   02/18/24 1140 02/18/24 1551 02/18/24 1558 02/18/24 1628  BP: (!) 140/77  (!) 143/79 123/83  Pulse:  75 74 76  Resp:  16 13 13   Temp: 99.1 F (37.3 C) 98.7 F (37.1 C)    TempSrc: Oral     SpO2:  98% 100% 100%  Weight:      Height:        Physical Exam General:Chronically ill appearing male, NAD. Room air Heart: RRR; 2/6 murmur Lungs: CTAB Abdomen: soft, ntnd Extremities: R BKA, and new L TMA wrapped Dialysis Access: TDC drsg intact  Dialysis: TTS [place] 4h  B400  92kg  TDC  Heparin  none - Mircera 225mcg IV q 2 weeks (last 4/9) - Calcitriol  0.23mcg PO q HD   Assessment/Plan:  Thoracic discitis/OM: recent acute on chronic back pain, s/p fall on 4/20. CT imaging showed T9-10 discitis. He refused MRI, area too small for aspiration per IR. Earlier this year he completed an 8 week course of Dapto/Cefepime  on 3/20 for bacteremia presumed due to foot infection. Concerned that this infection may have seeded the spine. Per ortho pt had L TMA on 4/30. Now is on IV Vanc/Cefepime  for 6 week course for the discitis. Blood cx's were negative.   ESRD:  Usual TTS schedule. HD today.   HTN/volume: BP ok, no edema. Euvolemic, low UF goal w/ HD. Under dry wt  Anemia: HGB 10.2. Follow labs. On weekly ESA. No venofer in setting of infection.  Metabolic bone disease: Ca ok, Phos high - resumed home binders.  Nutrition: Alb 3.4, follow.  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  [name]  MD  [facility] 02/18/2024, 4:49 PM  Recent Labs  Lab 02/16/24 0406 02/18/24 0504  HGB 10.2* 9.6*  ALBUMIN  3.2* 3.4*  CALCIUM  8.6* 8.5*  PHOS 7.3* 6.9*  CREATININE 8.09* 7.75*  K 5.9* 5.1

## 2024-02-18 NOTE — Procedures (Signed)
 Patient requested to end treatment early due to being hungry. AMA signed.

## 2024-02-18 NOTE — Progress Notes (Signed)
 Pt watch located in plastic bag in patients chart. Watch returned to patient.

## 2024-02-18 NOTE — Plan of Care (Signed)

## 2024-02-18 NOTE — Progress Notes (Addendum)
 PROGRESS NOTE        PATIENT DETAILS Name: Thomas [name] Age: 73 y.o. Sex: male Date of Birth: [birth year] Admit Date: 02/07/2024 Admitting Physician Roxana Copier, DO ZOX:WRUEAV, [name], MD  Brief Summary: Patient is a 73 y.o.  male who with history of ESRD on HD TTS, history of posterior fusion at T10-12 due to osteomyelitis/discitis/epidural abscess in 2021, history of right BKA who recently completed 6 weeks of daptomycin /cefepime  on 3/28 for left foot infection with osteomyelitis-presented with low back pain-upon further evaluation with CT imaging-he was found to have acute osteomyelitis involving his thoracic spine and admitted to the hospitalist service.  Significant events: 4/20>> admit to [facility]-back pain-concern for thoracic osteomyelitis.  Significant studies: 4/21>> x-ray right elbow: No fracture. 4/21>> x-ray left wrist: No fracture 4/21>> x-ray left shoulder, no fracture. 4/21>> CT head: No acute intracranial abnormality 4/21>> CT C-spine: No fracture. 4/21>> CT abdomen/pelvis: No acute injury to chest/abdomen/pelvis 4/21>> CT T-spine: Prior posterior fusion from T10-T12-abnormal appearance with collapse of T10-lucency around T10 screws at the T9-10 disc space, T10-11 disc space concerning for discitis/osteomyelitis. 4/21>> CT L-spine: No acute bony abnormality, mild chronic compression fracture at L1. 4/22>> x-ray left foot: Chronic erosive changes about the forefoot similar to 11/21/2023-osteomyelitis/septic arthritis of the 1st/2nd MTP joints difficult to exclude. 4/25>> left ABI: Within normal limit.  Significant microbiology data: 4/22>> blood culture: No growth  Procedures: 4/30>> left transmetatarsal amputation by Dr. Julio Ohm.  Consults: Neurosurgery Infectious disease Ortho  Subjective: Continues to complain of back pain.  Does not want diabetic/renal diet-once to be on a "regular" diet.  Not keen on going to SNF but claims he will  keep an open mind.  Objective: Vitals: Blood pressure 123/87, pulse 71, temperature 98 F (36.7 C), resp. rate 18, height 6\' 5"  (1.956 m), weight 91.8 kg, SpO2 100%.   Exam: Awake/alert Chest: Clear to auscultation CVS: S1-S2 regular Abdomen: Soft nontender nondistended Extremities: Left TMA-extremity in bandage.  S/p right AKA. Nonfocal exam.   Pertinent Labs/Radiology:    Latest Ref Rng & Units 02/18/2024    5:04 AM 02/16/2024    4:06 AM 02/14/2024    6:12 AM  CBC  WBC 4.0 - 10.5 K/uL 6.7  5.4  6.0   Hemoglobin 13.0 - 17.0 g/dL 9.6  40.9  81.1   Hematocrit 39.0 - 52.0 % 29.9  31.1  31.4   Platelets 150 - 400 K/uL 151  155  142     Lab Results  Component Value Date   NA 139 02/18/2024   K 5.1 02/18/2024   CL 102 02/18/2024   CO2 20 (L) 02/18/2024    Assessment/Plan: Acute osteomyelitis with discitis involving T9 to T11 vertebra Per neurosurgery not an operative candidate Per IR-no window to aspirate Plan is for 6 weeks of vancomycin /cefepime  with hemodialysis-EOT 6/4 Blood cultures negative so far Continue back pain-restarting his long-acting OxyContin -will start at 30 mg every 12 (normally takes Q8)-will continue to use as needed oxycodone  for moderate breakthrough pain and Dilaudid  for severe breakthrough pain.  Will optimize over the next several days Remains on a bowel regimen with MiraLAX /senna.  Possible osteomyelitis of left foot involving 1st/2nd MTP joints. Chronic left second toe ulceration Intolerant to MRI in the past-not keen on getting one He just completed 6 weeks of daptomycin /cefepime  on 3/28-has refused amputation.  Concern is that  he may have seeded his spine through foot infection.  Subsequently evaluated by Dr. Julio Ohm on 4/28-underwent TMA on 4/30.  Transient hypotension on 4/30 Likely secondary to antihypertensives-and hemodialysis that he had the day prior All antihypertensives was held-given midodrine  x 1-gentle IV fluid bolus given-blood pressure  now stable Will restart amlodipine  today-continue to hold clonidine , losartan /metoprolol . Reassess on 5/2 and add other antihypertensives as able.  Mechanical fall Numerous imaging studies negative for fracture PT/OT eval.  ESRD on HD TTS Nephrology following and directing HD care  Hyperkalemia Treated with HD on 4/29 Trend daily lites periodically.  Prolonged QTc Twelve-lead EKG on 4/30 with normal QTc Telemetry monitoring  Normocytic anemia Secondary to ESRD Aranesp /iron  deferred to nephrology service.  Chronic HFpEF Euvolemic on exam Fluid removal with HD  HTN See above regarding hypotension Amlodipine  being started today.  HLD Statin  Hypothyroidism Synthroid   BPH Flomax /bethanechol   History of right BKA  Debility/deconditioning Needs SNF-he is not keen on going to SNF but he is s/p right BKA-now s/p left TMA with touchdown weightbearing.  Lives alone and does not have much help.  Extensive counseling done on 5/1 regarding the need for SNF.  He said he will keep an open mind.  Social worker following.  BMI: Estimated body mass index is 24 kg/m as calculated from the following:   Height as of this encounter: 6\' 5"  (1.956 m).   Weight as of this encounter: 91.8 kg.   Code status:   Code Status: Full Code   DVT Prophylaxis: SCD's Start: 02/17/24 1538 heparin  injection 5,000 Units Start: 02/10/24 1400 SCDs Start: 02/08/24 2206   Family Communication: Brother-[id number] updated 5/1   Disposition Plan: Status is: Inpatient Remains inpatient appropriate because: Severity of illness   Planned Discharge Destination:Home health vs SNF   Diet: Diet Order             Diet regular Fluid consistency: Thin  Diet effective now                     Antimicrobial agents: Anti-infectives (From admission, onward)    Start     Dose/Rate Route Frequency Ordered Stop   02/11/24 1800  ceFEPIme  (MAXIPIME ) 2 g in sodium chloride  0.9 % 100 mL IVPB         2 g 200 mL/hr over 30 Minutes Intravenous Every T-Th-Sa (Hemodialysis) 02/10/24 1028     02/11/24 1200  vancomycin  (VANCOCIN ) IVPB 1000 mg/200 mL premix        1,000 mg 200 mL/hr over 60 Minutes Intravenous Every T-Th-Sa (Hemodialysis) 02/10/24 1028     02/11/24 0000  ceFEPime  (MAXIPIME ) IVPB        2 g Intravenous Every T-Th-Sa (Hemodialysis) 02/11/24 0747 03/23/24 2359   02/11/24 0000  vancomycin  IVPB        1,000 mg Intravenous Every T-Th-Sa (Hemodialysis) 02/11/24 0747 03/23/24 2359   02/10/24 1115  vancomycin  (VANCOREADY) IVPB 2000 mg/400 mL        2,000 mg 200 mL/hr over 120 Minutes Intravenous  Once 02/10/24 1028 02/10/24 1446   02/10/24 1115  ceFEPIme  (MAXIPIME ) 2 g in sodium chloride  0.9 % 100 mL IVPB        2 g 200 mL/hr over 30 Minutes Intravenous  Once 02/10/24 1028 02/10/24 1239        MEDICATIONS: Scheduled Meds:  vitamin C   1,000 mg Oral Daily   atorvastatin   80 mg Oral Daily   bethanechol   10 mg Oral BID  Chlorhexidine  Gluconate Cloth  6 each Topical Q0600   Chlorhexidine  Gluconate Cloth  6 each Topical Q0600   Chlorhexidine  Gluconate Cloth  6 each Topical Q0600   darbepoetin (ARANESP ) injection - DIALYSIS  100 mcg Subcutaneous Q Sat-1800   heparin  injection (subcutaneous)  5,000 Units Subcutaneous Q8H   leptospermum manuka honey  1 Application Topical Daily   levothyroxine   50 mcg Oral QAC breakfast   nutrition supplement (JUVEN)  1 packet Oral BID BM   oxyCODONE   30 mg Oral BID   polyethylene glycol  17 g Oral BID   senna-docusate  4 tablet Oral BID   sevelamer  carbonate  1,600 mg Oral TID WC   tamsulosin   0.4 mg Oral Daily   zinc  sulfate (50mg  elemental zinc )  220 mg Oral Daily   Continuous Infusions:  sodium chloride  10 mL/hr at 02/17/24 1600   anticoagulant sodium citrate      ceFEPime  (MAXIPIME ) IV 2 g (02/16/24 1755)   promethazine  (PHENERGAN ) injection (IM or IVPB)     vancomycin  Stopped (02/16/24 1251)   PRN Meds:.acetaminophen  **OR**  acetaminophen , alteplase , anticoagulant sodium citrate , bisacodyl , feeding supplement (NEPRO CARB STEADY), heparin , HYDROmorphone  (DILAUDID ) injection, lidocaine  (PF), lidocaine -prilocaine , melatonin, naLOXone  (NARCAN )  injection, ondansetron  (ZOFRAN ) IV, oxyCODONE , pentafluoroprop-tetrafluoroeth, promethazine  (PHENERGAN ) injection (IM or IVPB), tiZANidine    I have personally reviewed following labs and imaging studies  LABORATORY DATA: CBC: Recent Labs  Lab 02/14/24 0612 02/16/24 0406 02/18/24 0504  WBC 6.0 5.4 6.7  HGB 10.2* 10.2* 9.6*  HCT 31.4* 31.1* 29.9*  MCV 105.4* 104.0* 105.7*  PLT 142* 155 151    Basic Metabolic Panel: Recent Labs  Lab 02/12/24 0505 02/13/24 1343 02/16/24 0406 02/18/24 0504  NA 133* 136 136 139  K 4.8 4.6 5.9* 5.1  CL 98 99 100 102  CO2 21* 22 23 20*  GLUCOSE 98 125* 87 91  BUN 34* 57* 62* 63*  CREATININE 5.70* 7.31* 8.09* 7.75*  CALCIUM  8.1* 8.1* 8.6* 8.5*  PHOS 5.1*  --  7.3* 6.9*    GFR: Estimated Creatinine Clearance: 11.2 mL/min (A) (by C-G formula based on SCr of 7.75 mg/dL (H)).  Liver Function Tests: Recent Labs  Lab 02/12/24 0505 02/16/24 0406 02/18/24 0504  ALBUMIN  3.4* 3.2* 3.4*   No results for input(s): "LIPASE", "AMYLASE" in the last 168 hours. No results for input(s): "AMMONIA" in the last 168 hours.  Coagulation Profile: No results for input(s): "INR", "PROTIME" in the last 168 hours.   Cardiac Enzymes: No results for input(s): "CKTOTAL", "CKMB", "CKMBINDEX", "TROPONINI" in the last 168 hours.  BNP (last 3 results) No results for input(s): "PROBNP" in the last 8760 hours.  Lipid Profile: No results for input(s): "CHOL", "HDL", "LDLCALC", "TRIG", "CHOLHDL", "LDLDIRECT" in the last 72 hours.  Thyroid Function Tests: No results for input(s): "TSH", "T4TOTAL", "FREET4", "T3FREE", "THYROIDAB" in the last 72 hours.  Anemia Panel: No results for input(s): "VITAMINB12", "FOLATE", "FERRITIN", "TIBC", "IRON ",  "RETICCTPCT" in the last 72 hours.   Urine analysis:    Component Value Date/Time   COLORURINE YELLOW 01/14/2022 0248   APPEARANCEUR HAZY (A) 01/14/2022 0248   LABSPEC 1.009 01/14/2022 0248   PHURINE 7.0 01/14/2022 0248   GLUCOSEU NEGATIVE 01/14/2022 0248   HGBUR MODERATE (A) 01/14/2022 0248   BILIRUBINUR NEGATIVE 01/14/2022 0248   KETONESUR NEGATIVE 01/14/2022 0248   PROTEINUR 100 (A) 01/14/2022 0248   NITRITE NEGATIVE 01/14/2022 0248   LEUKOCYTESUR LARGE (A) 01/14/2022 0248    Sepsis Labs: Lactic Acid, Venous    Component  Value Date/Time   LATICACIDVEN 1.0 12/31/2021 0048    MICROBIOLOGY: Recent Results (from the past 240 hours)  Culture, blood (Routine X 2) w Reflex to ID Panel     Status: None   Collection Time: 02/09/24  4:06 AM   Specimen: BLOOD  Result Value Ref Range Status   Specimen Description BLOOD BLOOD RIGHT HAND  Final   Special Requests   Final    BOTTLES DRAWN AEROBIC ONLY Blood Culture adequate volume   Culture   Final    NO GROWTH 5 DAYS Performed at [facility], [address], [place], [place] [address]    Report Status 02/14/2024 FINAL  Final  Culture, blood (Routine X 2) w Reflex to ID Panel     Status: None   Collection Time: 02/09/24  4:14 AM   Specimen: BLOOD  Result Value Ref Range Status   Specimen Description BLOOD BLOOD RIGHT HAND AEROBIC BOTTLE ONLY  Final   Special Requests   Final    BOTTLES DRAWN AEROBIC ONLY Blood Culture adequate volume   Culture   Final    NO GROWTH 5 DAYS Performed at [facility], [address], [place], [place] [address]    Report Status 02/14/2024 FINAL  Final    RADIOLOGY STUDIES/RESULTS: No results found.     LOS: 10 days   Kimberly Penna, MD  [facility]    To contact the attending provider between 7A-7P or the covering provider during after hours 7P-7A, please log into the web site [email or web address] and access using universal [place] password for that web site. If you  do not have the password, please call the hospital operator.  02/18/2024, 11:25 AM

## 2024-02-18 NOTE — Procedures (Signed)
 Received patient in bed to unit.  Alert and oriented.  Informed consent signed and in chart.   TX duration: 3 hours 7 minutes  Patient tolerated well but signed off early.  Transported back to the room  Alert, without acute distress.  Hand-off given to patient's nurse. Nurse aware that I was unable to give either antibiotic due to time constraint.    Access used: right cath Access issues: none  Total UF removed: -0.2 liters Medication(s) given: dilaudid , oxycodone   Clover Dao, RN [facility]

## 2024-02-18 NOTE — Procedures (Signed)
 Patient reports that he wants to get off machine at 7:15.  Advised patient that he still has antibiotics to get but I will not be able to give them within the time frame but that he should've just stayed on and finished treatment because he didn't have much longer left and I would've been able to administer at least one antibiotic.

## 2024-02-18 NOTE — Evaluation (Signed)
 Physical Therapy Re-Evaluation Patient Details Name: Thomas [name] MRN: [id number] DOB: 03/02/1951 Today's Date: 02/18/2024  History of Present Illness  The pt is a 73 yo male presenting 4/20 with back pain after a fall at home. 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 PMH includes: ESRD on HD TTS, prior thoracic spine fusion, spinal 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-T12, chronic thoracic osteomyelitis, chronic pain syndrome, gout, PE not on anticoagulation, DM-2, right BKA, left second toe ulcer and HTN   Clinical Impression  Re-evaluated following Lt transmetatarsal amputation 4/30. Limited assessment, pt states he needs more pain medication to participate further than low level bed mobility assessment. (He was pre-medicated heavily prior to my visit, in coordination with RN). Educated on LLE WB precautions (TDWB). Rolls in bed, and performs partial long sit but declines further mobility at this time. Encouraged OOB activity as tolerated. He is open to SNF but only if 4+stars by medicare standards. States he will have RN message PT if he feels well enough to participate further today. Of note, pt refused the last 3 visits therapy attempted to work with pt. Patient will benefit from continued inpatient follow up therapy, <3 hours/day.         If plan is discharge home, recommend the following: Two people to help with walking and/or transfers;Two people to help with bathing/dressing/bathroom;Assistance with cooking/housework;Assistance with feeding;Direct supervision/assist for medications management;Direct supervision/assist for financial management;Assist for transportation;Help with stairs or ramp for entrance   Can travel by private vehicle   No    Equipment Recommendations Hospital bed (States w/c will not fit in home)  Recommendations for Other Services       Functional Status Assessment Patient has had a recent decline in their functional status and  demonstrates the ability to make significant improvements in function in a reasonable and predictable amount of time.     Precautions / Restrictions Precautions Precautions: Back;Fall (Back for comfort, no order in EMR.) Recall of Precautions/Restrictions: Intact Precaution/Restrictions Comments:  (educated on spinal precautions for comfort, no formal back precautions ordered. WB precautions TDWB Lt foot) Required Braces or Orthoses: Spinal Brace;Other Brace Spinal Brace: Thoracolumbosacral orthotic;Applied in sitting position Other Brace: Post op shoe Restrictions Weight Bearing Restrictions Per Provider Order: Yes LLE Weight Bearing Per Provider Order: Touchdown weight bearing Other Position/Activity Restrictions: Post op shoe      Mobility  Bed Mobility Overal bed mobility: Needs Assistance Bed Mobility: Rolling, Supine to Sit Rolling: Min assist, Used rails   Supine to sit: Mod assist     General bed mobility comments: Performed 1/4 roll towards left, needed cues not to push thorugh LLE amputation. Min assist, limited by pain use of rails. Demonstrated partial supine to long sit pulling bil rails stating this is the only way he can move. Unwilling to sit EOB or ambulate stating pain as limiting factor.    Transfers                   General transfer comment: declined    Ambulation/Gait                  Stairs            Wheelchair Mobility     Tilt Bed    Modified Rankin (Stroke Patients Only)       Balance Overall balance assessment: History of Falls (unwilling to sit EOB at this time 2/2 pain in spine, RN notified)  Pertinent Vitals/Pain Pain Assessment Pain Assessment: Faces Pain Location: 8 at rest, 10 with any movement Back Pain Descriptors / Indicators: Aching, Sore Pain Intervention(s): Limited activity within patient's tolerance, Monitored during session, Premedicated  before session, Repositioned, Patient requesting pain meds-RN notified    Home Living Family/patient expects to be discharged to:: Private residence Living Arrangements: Alone Available Help at Discharge: Friend(s) (friend takes him to the grocery store) Type of Home: Apartment Home Access: Stairs to enter Entrance Stairs-Rails: None Entrance Stairs-Number of Steps: 1   Home Layout: One level Home Equipment: Agricultural consultant (2 wheels);Crutches (patient reports leaving RW at threshold of door and holding onto counter and objects to walk into bathroom) Additional Comments: Pt reports he sleeps in a wingback chair. Had a hospital bed but it was destroyed during a break-in.    Prior Function Prior Level of Function : Independent/Modified Independent;History of Falls (last six months) (patient reports that his falls have been caused by his prosethic LE loosening 2/2 weight lose per patient report)             Mobility Comments: Pt amb household distances with rolling walker and prosthetic ADLs Comments: sponge baths     Extremity/Trunk Assessment   Upper Extremity Assessment Upper Extremity Assessment: Defer to OT evaluation    Lower Extremity Assessment Lower Extremity Assessment: RLE deficits/detail;LLE deficits/detail RLE Deficits / Details: chronic BKA, grossly functional ROM RLE: Unable to fully assess due to pain RLE Sensation: decreased light touch (at end of residual limb) LLE Deficits / Details: Limited by bandaging. LLE: Unable to fully assess due to immobilization LLE Sensation: history of peripheral neuropathy    Cervical / Trunk Assessment Cervical / Trunk Assessment: Kyphotic;Other exceptions Cervical / Trunk Exceptions: significant kyphosis, hx of neck fx and surgery,  Communication   Communication Communication: No apparent difficulties    Cognition Arousal: Alert Behavior During Therapy: WFL for tasks assessed/performed   PT - Cognitive impairments: No  family/caregiver present to determine baseline                       PT - Cognition Comments: pt able to answer all questions, not willing to attempt movement due to fear of pain and not able to be convinced. at times tangential and interrupting PT education to talk about prior surgeries and need for increased pain medications. Following commands: Intact       Cueing Cueing Techniques: Verbal cues     General Comments General comments (skin integrity, edema, etc.): BP 151/82 SpO2 100% on RA. Pt was premedicated with both available medications but states he needs another round before being able to participate fully. RN notified. Encouraged OOB as tolerated. Educated on log roll techniques for back comfort but states he can only turn a little and prefers to pull up with HOB elevated into long sit position as this is the most comfortable way to move.    Exercises     Assessment/Plan    PT Assessment Patient needs continued PT services  PT Problem List Decreased strength;Decreased activity tolerance;Decreased range of motion;Decreased balance;Decreased mobility;Impaired sensation;Pain;Decreased knowledge of use of DME;Decreased knowledge of precautions       PT Treatment Interventions DME instruction;Gait training;Stair training;Functional mobility training;Therapeutic activities;Therapeutic exercise;Balance training;Patient/family education;Neuromuscular re-education;Wheelchair mobility training;Modalities    PT Goals (Current goals can be found in the Care Plan section)  Acute Rehab PT Goals Patient Stated Goal: to reduce pain PT Goal Formulation: With patient Time For Goal Achievement: 03/03/24 Potential  to Achieve Goals: Fair    Frequency Min 2X/week     Co-evaluation               AM-PAC PT "6 Clicks" Mobility  Outcome Measure Help needed turning from your back to your side while in a flat bed without using bedrails?: A Lot Help needed moving from lying on  your back to sitting on the side of a flat bed without using bedrails?: A Lot Help needed moving to and from a bed to a chair (including a wheelchair)?: Total Help needed standing up from a chair using your arms (e.g., wheelchair or bedside chair)?: Total Help needed to walk in hospital room?: Total Help needed climbing 3-5 steps with a railing? : Total 6 Click Score: 8    End of Session   Activity Tolerance: Patient limited by pain Patient left: in bed;with call bell/phone within reach;with bed alarm set Nurse Communication: Mobility status;Patient requests pain meds PT Visit Diagnosis: Unsteadiness on feet (R26.81);Other abnormalities of gait and mobility (R26.89);Repeated falls (R29.6);Muscle weakness (generalized) (M62.81);Pain;Difficulty in walking, not elsewhere classified (R26.2);History of falling (Z91.81) Pain - part of body:  (back)    Time: 2956-2130 PT Time Calculation (min) (ACUTE ONLY): 19 min   Charges:   PT Evaluation $PT Re-evaluation: 1 Re-eval   PT General Charges $$ ACUTE PT VISIT: 1 Visit         Jory Ng, PT, DPT [facility]  Rehabilitation Services Physical Therapist Office: [phone] Website: [email or web address]   [name] 02/18/2024, 12:32 PM

## 2024-02-19 DIAGNOSIS — M4624 Osteomyelitis of vertebra, thoracic region: Secondary | ICD-10-CM | POA: Diagnosis not present

## 2024-02-19 DIAGNOSIS — E039 Hypothyroidism, unspecified: Secondary | ICD-10-CM | POA: Diagnosis not present

## 2024-02-19 DIAGNOSIS — E119 Type 2 diabetes mellitus without complications: Secondary | ICD-10-CM | POA: Diagnosis not present

## 2024-02-19 DIAGNOSIS — I10 Essential (primary) hypertension: Secondary | ICD-10-CM | POA: Diagnosis not present

## 2024-02-19 LAB — GLUCOSE, CAPILLARY
Glucose-Capillary: 100 mg/dL — ABNORMAL HIGH (ref 70–99)
Glucose-Capillary: 99 mg/dL (ref 70–99)
Glucose-Capillary: 90 mg/dL (ref 70–99)
Glucose-Capillary: 145 mg/dL — ABNORMAL HIGH (ref 70–99)

## 2024-02-19 MED ORDER — LACTULOSE 10 GM/15ML PO SOLN
30.0000 g | Freq: Every day | ORAL | Status: DC
  Administered 2024-02-22 – 2024-03-16 (×18): 30 g via ORAL
  Filled 2024-02-19 (×4): qty 60
  Filled 2024-02-19: qty 45
  Filled 2024-02-19: qty 60
  Filled 2024-02-19: qty 45
  Filled 2024-02-19 (×2): qty 60
  Filled 2024-02-19: qty 45
  Filled 2024-02-19 (×4): qty 60
  Filled 2024-02-19: qty 45
  Filled 2024-02-19 (×6): qty 60

## 2024-02-19 MED ORDER — CHLORHEXIDINE GLUCONATE CLOTH 2 % EX PADS
6.0000 | MEDICATED_PAD | Freq: Every day | CUTANEOUS | Status: DC
  Administered 2024-02-20 – 2024-02-22 (×3): 6 via TOPICAL

## 2024-02-19 MED ORDER — OXYCODONE HCL ER 10 MG PO T12A
30.0000 mg | EXTENDED_RELEASE_TABLET | Freq: Three times a day (TID) | ORAL | Status: DC
  Administered 2024-02-19 – 2024-03-16 (×78): 30 mg via ORAL
  Filled 2024-02-19 (×2): qty 3
  Filled 2024-02-19: qty 2
  Filled 2024-02-19: qty 3
  Filled 2024-02-19 (×5): qty 2
  Filled 2024-02-19: qty 3
  Filled 2024-02-19 (×6): qty 2
  Filled 2024-02-19 (×2): qty 3
  Filled 2024-02-19: qty 2
  Filled 2024-02-19: qty 3
  Filled 2024-02-19: qty 2
  Filled 2024-02-19: qty 3
  Filled 2024-02-19 (×6): qty 2
  Filled 2024-02-19: qty 3
  Filled 2024-02-19 (×12): qty 2
  Filled 2024-02-19: qty 3
  Filled 2024-02-19 (×2): qty 2
  Filled 2024-02-19: qty 3
  Filled 2024-02-19 (×9): qty 2
  Filled 2024-02-19: qty 3
  Filled 2024-02-19: qty 2
  Filled 2024-02-19: qty 3
  Filled 2024-02-19 (×6): qty 2
  Filled 2024-02-19: qty 3
  Filled 2024-02-19 (×10): qty 2
  Filled 2024-02-19 (×4): qty 3

## 2024-02-19 MED ORDER — NALOXEGOL OXALATE 12.5 MG PO TABS
12.5000 mg | ORAL_TABLET | Freq: Every day | ORAL | Status: DC
  Administered 2024-02-19 – 2024-03-16 (×24): 12.5 mg via ORAL
  Filled 2024-02-19 (×27): qty 1

## 2024-02-19 NOTE — Progress Notes (Signed)
 PT Cancellation Note  Patient Details Name: Thomas [name] MRN: [id number] DOB: [birth year]   Cancelled Treatment:    Reason Eval/Treat Not Completed: Other (comment) Attempted to see pt for PT session as he told OT prior to session that he wanted to try to walk. Coordinated PT session for ~30 minutes after pt received pain meds (oxy & dilaudid ). Pt received in bed sleeping, pt requiring extra encouragement to awaken. Pt declines participating noting he wants the pain to go away first. PT encouraged pt to participate as this is the best time following pain med administration but pt continues to decline, keeping eyes closed. Notified MD & team. Will continue efforts as able.  Thomas [name], PT, DPT 02/19/24, 11:51 AM   Thomas [name] 02/19/2024, 11:49 AM

## 2024-02-19 NOTE — Plan of Care (Signed)
 Pt has rested quietly throughout the  night with no distress noted. Alert and oriented. ON room air. SR on the monitor. Voids per urinal. Had been medicated 3 times with dilaudid  for back pain with relief. No other complaints voiced.     Problem: Nutritional: Goal: Maintenance of adequate nutrition will improve Outcome: Progressing   Problem: Skin Integrity: Goal: Risk for impaired skin integrity will decrease Outcome: Progressing   Problem: Tissue Perfusion: Goal: Adequacy of tissue perfusion will improve Outcome: Progressing   Problem: Education: Goal: Knowledge of General Education information will improve Description: Including pain rating scale, medication(s)/side effects and non-pharmacologic comfort measures Outcome: Progressing   Problem: Clinical Measurements: Goal: Respiratory complications will improve Outcome: Progressing Goal: Cardiovascular complication will be avoided Outcome: Progressing   Problem: Pain Managment: Goal: General experience of comfort will improve and/or be controlled Outcome: Progressing

## 2024-02-19 NOTE — Progress Notes (Signed)
 [facility] Progress Note   Subjective:  Seen in room Awaiting a suppository  Objective Vitals:   02/19/24 0450 02/19/24 0600 02/19/24 0835 02/19/24 1200  BP: (!) 150/127  (!) 149/111 137/78  Pulse: 74  (!) 102 84  Resp: 16  19 11   Temp: 98.7 F (37.1 C)  97.7 F (36.5 C) 98.7 F (37.1 C)  TempSrc: Oral  Axillary Oral  SpO2: 100%     Weight:  95.9 kg    Height:        Physical Exam General:Chronically ill appearing male, NAD. Room air Heart: RRR; 2/6 murmur Lungs: CTAB Abdomen: soft, ntnd Extremities: R BKA, and new L TMA wrapped Dialysis Access: TDC drsg intact  Dialysis: TTS [facility] 4h  B400  92kg  TDC  Heparin  none - Mircera 225mcg IV q 2 weeks (last 4/9) - Calcitriol  0.56mcg PO q HD   Assessment/Plan:  Thoracic discitis/OM: recent acute on chronic back pain, s/p fall on 4/20. CT imaging showed T9-10 discitis. He refused MRI, area too small for aspiration per IR. Earlier this year he completed an 8 week course of Dapto/Cefepime  on 3/20 for bacteremia presumed due to foot infection. Concerned that this infection may have seeded the spine. Blood cx's were negative. Per ortho pt had L TMA on 4/30. Per ID needs IV Vanc/Cefepime  x 6 weeks thru 03/23/24 for the discitis.   ESRD:  Usual TTS schedule. HD tomorrow.   HTN/volume: BP ok, no vol ^ on exam. Up 2-3kg. UF 2- 3 L w/ next HD  Anemia: HGB 10.2. Follow labs. On weekly ESA. No venofer in setting of infection.  Metabolic bone disease: Ca ok, Phos high - resumed home binders.  Nutrition: Alb 3.4, follow.  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  [name]  MD  [facility] 02/19/2024, 2:50 PM  Recent Labs  Lab 02/16/24 0406 02/18/24 0504  HGB 10.2* 9.6*  ALBUMIN  3.2* 3.4*  CALCIUM  8.6* 8.5*  PHOS 7.3* 6.9*  CREATININE 8.09* 7.75*  K 5.9* 5.1    Inpatient medications:  amLODipine   5 mg Oral Daily   vitamin C   1,000 mg Oral Daily   atorvastatin   80 mg Oral Daily   bethanechol   10 mg Oral BID    Chlorhexidine  Gluconate Cloth  6 each Topical Q0600   Chlorhexidine  Gluconate Cloth  6 each Topical Q0600   Chlorhexidine  Gluconate Cloth  6 each Topical Q0600   Chlorhexidine  Gluconate Cloth  6 each Topical Q0600   darbepoetin (ARANESP ) injection - DIALYSIS  100 mcg Subcutaneous Q Sat-1800   heparin  injection (subcutaneous)  5,000 Units Subcutaneous Q8H   lactulose   30 g Oral Daily   leptospermum manuka honey  1 Application Topical Daily   levothyroxine   50 mcg Oral QAC breakfast   naloxegol  oxalate  12.5 mg Oral Daily   nutrition supplement (JUVEN)  1 packet Oral BID BM   oxyCODONE   30 mg Oral Q8H   polyethylene glycol  17 g Oral BID   senna-docusate  4 tablet Oral BID   sevelamer  carbonate  1,600 mg Oral TID WC   tamsulosin   0.4 mg Oral Daily   zinc  sulfate (50mg  elemental zinc )  220 mg Oral Daily    sodium chloride  10 mL/hr at 02/17/24 1600   ceFEPime  (MAXIPIME ) IV 2 g (02/18/24 2147)   promethazine  (PHENERGAN ) injection (IM or IVPB)     vancomycin  1,000 mg (02/18/24 2234)   acetaminophen  **OR** acetaminophen , bisacodyl , HYDROmorphone  (DILAUDID ) injection, melatonin, naLOXone  (NARCAN )  injection, ondansetron  (ZOFRAN ) IV, oxyCODONE , promethazine  (PHENERGAN ) injection (IM or IVPB), tiZANidine 

## 2024-02-19 NOTE — Evaluation (Signed)
 Occupational Therapy Evaluation Patient Details Name: Thomas [name] MRN: [id number] DOB: 10/14/1951 Today's Date: 02/19/2024   History of Present Illness   The pt is a 73 yo male presenting 4/20 with back pain after a fall at home. 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 PMH includes: ESRD on HD TTS, prior thoracic spine fusion, spinal 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-T12, chronic thoracic osteomyelitis, chronic pain syndrome, gout, PE not on anticoagulation, DM-2, right BKA, left second toe ulcer and HTN     Clinical Impressions Prior to admission patient was modified independent with adls and had a history of falls.  Currently patient mod to max a with adls from a bed level.  He was cooperative with OT to sit EOB but complaining of pain in his back, stating it hurts to breath.  He stated he wanted to try walking, but when PT attempted he refused.  Issued theraband and he returned demonstration of BUE exercises.  Patient will benefit from continued inpatient follow up therapy, <3 hours/day and continued OT on acute .      If plan is discharge home, recommend the following:   A lot of help with walking and/or transfers;A lot of help with bathing/dressing/bathroom;Assist for transportation;Assistance with cooking/housework     Functional Status Assessment   Patient has had a recent decline in their functional status and demonstrates the ability to make significant improvements in function in a reasonable and predictable amount of time.     Equipment Recommendations   Hospital bed     Recommendations for Other Services         Precautions/Restrictions   Precautions Precautions: Back;Fall Recall of Precautions/Restrictions: Intact Required Braces or Orthoses: Spinal Brace;Other Brace Spinal Brace: Thoracolumbosacral orthotic;Applied in sitting position Other Brace: Post op shoe Restrictions Weight Bearing Restrictions Per Provider  Order: Yes LLE Weight Bearing Per Provider Order: Touchdown weight bearing Other Position/Activity Restrictions: Post op shoe     Mobility Bed Mobility Overal bed mobility: Needs Assistance Bed Mobility: Supine to Sit, Sit to Supine Rolling: Supervision, Used rails   Supine to sit: Min assist, Used rails          Transfers                          Balance                                           ADL either performed or assessed with clinical judgement   ADL Overall ADL's : Needs assistance/impaired Eating/Feeding: Set up;Sitting;Bed level   Grooming: Wash/dry hands;Wash/dry face;Set up;Bed level   Upper Body Bathing: Minimal assistance;Sitting   Lower Body Bathing: Maximal assistance;Sitting/lateral leans   Upper Body Dressing : Minimal assistance   Lower Body Dressing: Maximal assistance;Sitting/lateral leans               Functional mobility during ADLs: Supervision/safety;Minimal assistance General ADL Comments: ADLS from bed level not sit to stand; pain is a limiting factor     Vision Baseline Vision/History: 0 No visual deficits Patient Visual Report: No change from baseline       Perception         Praxis         Pertinent Vitals/Pain Pain Assessment Pain Assessment: Faces Pain Score: 8  Pain Location: Center left in the back.  States  it hurts to breath Pain Descriptors / Indicators: Aching, Grimacing, Sore Pain Intervention(s): Limited activity within patient's tolerance     Extremity/Trunk Assessment Upper Extremity Assessment Upper Extremity Assessment: Generalized weakness (Patient is Kyphotic with head forward posture and has decreased ability to get to his end ranges.)       Cervical / Trunk Assessment Cervical / Trunk Assessment: Kyphotic Cervical / Trunk Exceptions: Limits BUE use   Communication Communication Communication: No apparent difficulties   Cognition Arousal: Alert Behavior During  Therapy: WFL for tasks assessed/performed Cognition: No apparent impairments                               Following commands: Intact       Cueing  General Comments   Cueing Techniques: Verbal cues  Patient was cooperative and wanted to work on walking.  Spoke to PT and RN- meds given.   Exercises Exercises: General Upper Extremity General Exercises - Upper Extremity Shoulder Flexion: AROM, Both, 5 reps, Theraband (Red theraband - HOB raised) Theraband Level (Shoulder Flexion): Level 2 (Red) Shoulder Extension:  (In bed with HOB raised) Shoulder ABduction: AROM, Both, 5 reps, Theraband Theraband Level (Shoulder Abduction): Level 2 (Red)   Shoulder Instructions      Home Living Family/patient expects to be discharged to:: Private residence Living Arrangements: Alone Available Help at Discharge: Friend(s) Type of Home: Apartment Home Access: Stairs to enter [employer] of Steps: 1 Entrance Stairs-Rails: None Home Layout: One level     Bathroom Shower/Tub: Chief Strategy Officer: Handicapped height Bathroom Accessibility: No   Home Equipment: Agricultural consultant (2 wheels);Crutches   Additional Comments: Pt reports he sleeps in a wingback chair. Had a hospital bed but it was destroyed during a break-in.      Prior Functioning/Environment Prior Level of Function : Independent/Modified Independent;History of Falls (last six months)             Mobility Comments: Pt amb household distances with rolling walker and prosthetic ADLs Comments: sponge baths    OT Problem List: Decreased strength;Decreased activity tolerance;Decreased safety awareness;Pain   OT Treatment/Interventions: Self-care/ADL training;Therapeutic exercise;Neuromuscular education;Energy conservation;DME and/or AE instruction;Therapeutic activities;Patient/family education      OT Goals(Current goals can be found in the care plan section)   Acute Rehab OT  Goals Patient Stated Goal: To go home OT Goal Formulation: With patient Time For Goal Achievement: 03/04/24 Potential to Achieve Goals: Fair   OT Frequency:  Min 1X/week    Co-evaluation              AM-PAC OT "6 Clicks" Daily Activity     Outcome Measure Help from another person eating meals?: None Help from another person taking care of personal grooming?: A Little Help from another person toileting, which includes using toliet, bedpan, or urinal?: A Lot Help from another person bathing (including washing, rinsing, drying)?: A Lot Help from another person to put on and taking off regular upper body clothing?: A Little Help from another person to put on and taking off regular lower body clothing?: A Lot 6 Click Score: 16   End of Session Nurse Communication: Patient requests pain meds  Activity Tolerance: Patient limited by pain Patient left: in bed;with call bell/phone within reach;with bed alarm set  OT Visit Diagnosis: Unsteadiness on feet (R26.81);Repeated falls (R29.6);History of falling (Z91.81);Muscle weakness (generalized) (M62.81);Pain Pain - part of body:  (back / foot)  Time: 1610-9604 OT Time Calculation (min): 26 min Charges:  OT General Charges $OT Visit: 1 Visit OT Evaluation $OT Eval Moderate Complexity: 1 Mod OT Treatments $Therapeutic Exercise: 8-22 mins  Chantal Comment OTR/L   Clementina Cutter 02/19/2024, 12:49 PM

## 2024-02-19 NOTE — Care Plan (Signed)
 Pt asked multiple staff members to ask the RN/CNA to help him with a bath.. This RN took patient bathing supplies and offered to assist with setting up for bath. Pt declined each time RN offered stating he was either nauseated or in pain, which RN provided PRN medication for. Pt aware to call for assistance if he wants to bathe.

## 2024-02-19 NOTE — TOC Progression Note (Signed)
 Transition of Care [facility]) - Progression Note    Patient Details  Name: Thomas [name] MRN: [id number] Date of Birth: 10/17/1951  [facility] of [facility]) CM/SW Contact  Eusebio High, RN Phone Number: 02/19/2024, 9:34 AM  Clinical Narrative:     TOC following  Both PT and OT have recommended SNF. TBD but if patient goes home, will need the following: Hospital bed, hoyer lift vs trapeze OR both; wheelchair and wheelchair cushion. Will need to confirm with patient if wheelchair will in fact fit in home.     Expected Discharge Plan: Home w Home Health Services Barriers to Discharge: Continued Medical Work up  Expected Discharge Plan and Services     Post Acute Care Choice: Skilled Nursing Facility Living arrangements for the past 2 months: Single Family Home                                       Social Determinants of Health (SDOH) Interventions SDOH Screenings   Food Insecurity: No Food Insecurity (02/09/2024)  Housing: Low Risk  (02/09/2024)  Transportation Needs: No Transportation Needs (02/09/2024)  Utilities: Not At Risk (02/09/2024)  Depression (PHQ2-9): Low Risk  (02/08/2020)  Social Connections: Socially Isolated (02/11/2024)  Tobacco Use: Low Risk  (02/07/2024)    Readmission Risk Interventions    01/15/2022    2:56 PM 01/02/2022    2:03 PM 12/31/2021   10:32 AM  Readmission Risk Prevention Plan  Transportation Screening Complete Complete Complete  Medication Review Oceanographer) Complete Complete Complete  PCP or Specialist appointment within 3-5 days of discharge Complete Complete Complete  HRI or Home Care Consult Complete Complete Complete  SW Recovery Care/Counseling Consult Complete Complete Complete  Palliative Care Screening Not Applicable Not Applicable Not Applicable  Skilled Nursing Facility Not Applicable Not Applicable Not Applicable

## 2024-02-19 NOTE — TOC Progression Note (Signed)
 Transition of Care [facility]) - Progression Note    Patient Details  Name: Thomas [name] MRN: [id number] Date of Birth: 10/19/1951  Transition of Care [facility]) CM/SW Contact  Jannice Mends, LCSW Phone Number: 02/19/2024, 4:22 PM  Clinical Narrative:    CSW met with patient to discuss SNF again. CSW went over SNF offers again and discussed [name] being the one with the highest Medicare rating on the list. Patient stated he would not go to one of those places as he has been in 12 of them before and always ended up back at the hospital. CSW had long conversation with patient who is pleasant but only wanted to go to [name] or [name]. Patient stated he would just as soon as go home. CSW notified RN of patient's request to have assistance with a bath.    Expected Discharge Plan: Home w Home Health Services Barriers to Discharge: Continued Medical Work up  Expected Discharge Plan and Services     Post Acute Care Choice: Skilled Nursing Facility Living arrangements for the past 2 months: Single Family Home                                       Social Determinants of Health (SDOH) Interventions SDOH Screenings   Food Insecurity: No Food Insecurity (02/09/2024)  Housing: Low Risk  (02/09/2024)  Transportation Needs: No Transportation Needs (02/09/2024)  Utilities: Not At Risk (02/09/2024)  Depression (PHQ2-9): Low Risk  (02/08/2020)  Social Connections: Socially Isolated (02/11/2024)  Tobacco Use: Low Risk  (02/07/2024)    Readmission Risk Interventions    01/15/2022    2:56 PM 01/02/2022    2:03 PM 12/31/2021   10:32 AM  Readmission Risk Prevention Plan  Transportation Screening Complete Complete Complete  Medication Review Oceanographer) Complete Complete Complete  PCP or Specialist appointment within 3-5 days of discharge Complete Complete Complete  HRI or Home Care Consult Complete Complete Complete  SW Recovery Care/Counseling Consult Complete Complete Complete   Palliative Care Screening Not Applicable Not Applicable Not Applicable  Skilled Nursing Facility Not Applicable Not Applicable Not Applicable

## 2024-02-19 NOTE — Progress Notes (Signed)
 PROGRESS NOTE        PATIENT DETAILS Name: Thomas [name] Age: 73 y.o. Sex: male Date of Birth: November 26, 1950 Admit Date: 02/07/2024 Admitting Physician Roxana Copier, DO ZOX:WRUEAV, [name], MD  Brief Summary: Patient is a 73 y.o.  male who with history of ESRD on HD TTS, history of posterior fusion at T10-12 due to osteomyelitis/discitis/epidural abscess in 2021, history of right BKA who recently completed 6 weeks of daptomycin /cefepime  on 3/28 for left foot infection with osteomyelitis-presented with low back pain-upon further evaluation with CT imaging-he was found to have acute osteomyelitis involving his thoracic spine and admitted to the hospitalist service.  Significant events: 4/20>> admit to TRH-back pain-concern for thoracic osteomyelitis.  Significant studies: 4/21>> x-ray right elbow: No fracture. 4/21>> x-ray left wrist: No fracture 4/21>> x-ray left shoulder, no fracture. 4/21>> CT head: No acute intracranial abnormality 4/21>> CT C-spine: No fracture. 4/21>> CT abdomen/pelvis: No acute injury to chest/abdomen/pelvis 4/21>> CT T-spine: Prior posterior fusion from T10-T12-abnormal appearance with collapse of T10-lucency around T10 screws at the T9-10 disc space, T10-11 disc space concerning for discitis/osteomyelitis. 4/21>> CT L-spine: No acute bony abnormality, mild chronic compression fracture at L1. 4/22>> x-ray left foot: Chronic erosive changes about the forefoot similar to 11/21/2023-osteomyelitis/septic arthritis of the 1st/2nd MTP joints difficult to exclude. 4/25>> left ABI: Within normal limit.  Significant microbiology data: 4/22>> blood culture: No growth  Procedures: 4/30>> left transmetatarsal amputation by Dr. Julio Ohm.  Consults: Neurosurgery Infectious disease Ortho  Subjective: Pain issues unchanged for the past several days-mostly back pain-now complaining of some pain in his foot as well.  Wants to make sure that I do not  discontinue his Dilaudid .  He seems to be a bit more acceptable to the fact that he will likely need to go to SNF.  He has not had a bowel movement in 3 days-and claims that he regularly uses a suppository at home.  Objective: Vitals: Blood pressure (!) 149/111, pulse (!) 102, temperature 97.7 F (36.5 C), temperature source Axillary, resp. rate 19, height 6\' 5"  (1.956 m), weight 95.9 kg, SpO2 100%.   Exam: Awake/alert Chest: Clear to auscultation CVS: S1-S2 regular Abdomen: Soft nontender nondistended Extremities: Left TMA-dressing in place-s/p right AKA.   Pertinent Labs/Radiology:    Latest Ref Rng & Units 02/18/2024    5:04 AM 02/16/2024    4:06 AM 02/14/2024    6:12 AM  CBC  WBC 4.0 - 10.5 K/uL 6.7  5.4  6.0   Hemoglobin 13.0 - 17.0 g/dL 9.6  40.9  81.1   Hematocrit 39.0 - 52.0 % 29.9  31.1  31.4   Platelets 150 - 400 K/uL 151  155  142     Lab Results  Component Value Date   NA 139 02/18/2024   K 5.1 02/18/2024   CL 102 02/18/2024   CO2 20 (L) 02/18/2024    Assessment/Plan: Acute osteomyelitis with discitis involving T9 to T11 vertebra Per neurosurgery not an operative candidate Per IR-no window to aspirate Plan is for 6 weeks of vancomycin /cefepime  with hemodialysis-EOT 6/4 Blood cultures negative so far Continues to have back pain-he also has chronic pain syndrome-he is back on his OxyContin  30 mg 3 times daily (home dose).  For now we will continue as needed oxycodone  for moderate breakthrough pain and IV Dilaudid  for severe breakthrough pain.  We will attempt  to optimize his pain regimen over the next several days  Possible osteomyelitis of left foot involving 1st/2nd MTP joints. Chronic left second toe ulceration Intolerant to MRI in the past-not keen on getting one He just completed 6 weeks of daptomycin /cefepime  on 3/28-has refused amputation.  Concern is that he may have seeded his spine through foot infection.  Subsequently evaluated by Dr. Julio Ohm on  4/28-underwent TMA on 4/30.  Transient hypotension on 4/30 Likely secondary to antihypertensives-and hemodialysis that he had the day prior All antihypertensives was held-given midodrine  x 1-gentle IV fluid bolus given-blood pressure now stable BP now noted to creep up-amlodipine  was resumed on 5/1-clonidine /losartan /metoprolol  remains on hold-resume when able  Constipation Chronic issue-secondary to narcotics No response to scheduled MiraLAX /senna for the past several days Dulcolax suppository x 1 today Discussed with pharmacy-will start Movantik .  Mechanical fall Numerous imaging studies negative for fracture PT/OT eval.  ESRD on HD TTS Nephrology following and directing HD care  Hyperkalemia Treated with HD on 4/29 Trend daily lites periodically.  Prolonged QTc Twelve-lead EKG on 4/30 with normal QTc Telemetry monitoring  Normocytic anemia Secondary to ESRD Aranesp /iron  deferred to nephrology service.  Chronic HFpEF Euvolemic on exam Fluid removal with HD  HTN See above regarding hypotension BP relatively stable-see above discussion.  HLD Statin  Hypothyroidism Synthroid   BPH Flomax /bethanechol   History of right BKA  Debility/deconditioning Very reluctant to go to SNF-difficult situation-he has s/p right AKA-now left TMA several days ago-with touchdown weightbearing status.  Lives alone-minimal family assistance-his brother helps him some.  After extensive discussion-he seems a bit more accepting of the fact that he may have to go to SNF short-term.  Social worker of will follow again today.   BMI: Estimated body mass index is 25.07 kg/m as calculated from the following:   Height as of this encounter: 6\' 5"  (1.956 m).   Weight as of this encounter: 95.9 kg.   Code status:   Code Status: Full Code   DVT Prophylaxis: SCD's Start: 02/17/24 1538 heparin  injection 5,000 Units Start: 02/10/24 1400 SCDs Start: 02/08/24 2206   Family Communication:  [phone] updated 5/1   Disposition Plan: Status is: Inpatient Remains inpatient appropriate because: Severity of illness   Planned Discharge Destination:Home health vs SNF   Diet: Diet Order             Diet regular Fluid consistency: Thin  Diet effective now                     Antimicrobial agents: Anti-infectives (From admission, onward)    Start     Dose/Rate Route Frequency Ordered Stop   02/11/24 1800  ceFEPIme  (MAXIPIME ) 2 g in sodium chloride  0.9 % 100 mL IVPB        2 g 200 mL/hr over 30 Minutes Intravenous Every T-Th-Sa (Hemodialysis) 02/10/24 1028     02/11/24 1200  vancomycin  (VANCOCIN ) IVPB 1000 mg/200 mL premix        1,000 mg 200 mL/hr over 60 Minutes Intravenous Every T-Th-Sa (Hemodialysis) 02/10/24 1028     02/11/24 0000  ceFEPime  (MAXIPIME ) IVPB        2 g Intravenous Every T-Th-Sa (Hemodialysis) 02/11/24 0747 03/23/24 2359   02/11/24 0000  vancomycin  IVPB        1,000 mg Intravenous Every T-Th-Sa (Hemodialysis) 02/11/24 0747 03/23/24 2359   02/10/24 1115  vancomycin  (VANCOREADY) IVPB 2000 mg/400 mL        2,000 mg 200 mL/hr over 120 Minutes Intravenous  Once 02/10/24 1028 02/10/24 1446   02/10/24 1115  ceFEPIme  (MAXIPIME ) 2 g in sodium chloride  0.9 % 100 mL IVPB        2 g 200 mL/hr over 30 Minutes Intravenous  Once 02/10/24 1028 02/10/24 1239        MEDICATIONS: Scheduled Meds:  amLODipine   5 mg Oral Daily   vitamin C   1,000 mg Oral Daily   atorvastatin   80 mg Oral Daily   bethanechol   10 mg Oral BID   Chlorhexidine  Gluconate Cloth  6 each Topical Q0600   Chlorhexidine  Gluconate Cloth  6 each Topical Q0600   Chlorhexidine  Gluconate Cloth  6 each Topical Q0600   Chlorhexidine  Gluconate Cloth  6 each Topical Q0600   darbepoetin (ARANESP ) injection - DIALYSIS  100 mcg Subcutaneous Q Sat-1800   heparin  injection (subcutaneous)  5,000 Units Subcutaneous Q8H   lactulose   30 g Oral Daily   leptospermum manuka honey  1  Application Topical Daily   levothyroxine   50 mcg Oral QAC breakfast   naloxegol  oxalate  12.5 mg Oral Daily   nutrition supplement (JUVEN)  1 packet Oral BID BM   oxyCODONE   30 mg Oral Q8H   polyethylene glycol  17 g Oral BID   senna-docusate  4 tablet Oral BID   sevelamer  carbonate  1,600 mg Oral TID WC   tamsulosin   0.4 mg Oral Daily   zinc  sulfate (50mg  elemental zinc )  220 mg Oral Daily   Continuous Infusions:  sodium chloride  10 mL/hr at 02/17/24 1600   ceFEPime  (MAXIPIME ) IV 2 g (02/18/24 2147)   promethazine  (PHENERGAN ) injection (IM or IVPB)     vancomycin  1,000 mg (02/18/24 2234)   PRN Meds:.acetaminophen  **OR** acetaminophen , bisacodyl , HYDROmorphone  (DILAUDID ) injection, melatonin, naLOXone  (NARCAN )  injection, ondansetron  (ZOFRAN ) IV, oxyCODONE , promethazine  (PHENERGAN ) injection (IM or IVPB), tiZANidine    I have personally reviewed following labs and imaging studies  LABORATORY DATA: CBC: Recent Labs  Lab 02/14/24 0612 02/16/24 0406 02/18/24 0504  WBC 6.0 5.4 6.7  HGB 10.2* 10.2* 9.6*  HCT 31.4* 31.1* 29.9*  MCV 105.4* 104.0* 105.7*  PLT 142* 155 151    Basic Metabolic Panel: Recent Labs  Lab 02/13/24 1343 02/16/24 0406 02/18/24 0504  NA 136 136 139  K 4.6 5.9* 5.1  CL 99 100 102  CO2 22 23 20*  GLUCOSE 125* 87 91  BUN 57* 62* 63*  CREATININE 7.31* 8.09* 7.75*  CALCIUM  8.1* 8.6* 8.5*  PHOS  --  7.3* 6.9*    GFR: Estimated Creatinine Clearance: 11.2 mL/min (A) (by C-G formula based on SCr of 7.75 mg/dL (H)).  Liver Function Tests: Recent Labs  Lab 02/16/24 0406 02/18/24 0504  ALBUMIN  3.2* 3.4*   No results for input(s): "LIPASE", "AMYLASE" in the last 168 hours. No results for input(s): "AMMONIA" in the last 168 hours.  Coagulation Profile: No results for input(s): "INR", "PROTIME" in the last 168 hours.   Cardiac Enzymes: No results for input(s): "CKTOTAL", "CKMB", "CKMBINDEX", "TROPONINI" in the last 168 hours.  BNP (last 3  results) No results for input(s): "PROBNP" in the last 8760 hours.  Lipid Profile: No results for input(s): "CHOL", "HDL", "LDLCALC", "TRIG", "CHOLHDL", "LDLDIRECT" in the last 72 hours.  Thyroid Function Tests: No results for input(s): "TSH", "T4TOTAL", "FREET4", "T3FREE", "THYROIDAB" in the last 72 hours.  Anemia Panel: No results for input(s): "VITAMINB12", "FOLATE", "FERRITIN", "TIBC", "IRON ", "RETICCTPCT" in the last 72 hours.   Urine analysis:    Component Value Date/Time  COLORURINE YELLOW 01/14/2022 0248   APPEARANCEUR HAZY (A) 01/14/2022 0248   LABSPEC 1.009 01/14/2022 0248   PHURINE 7.0 01/14/2022 0248   GLUCOSEU NEGATIVE 01/14/2022 0248   HGBUR MODERATE (A) 01/14/2022 0248   BILIRUBINUR NEGATIVE 01/14/2022 0248   KETONESUR NEGATIVE 01/14/2022 0248   PROTEINUR 100 (A) 01/14/2022 0248   NITRITE NEGATIVE 01/14/2022 0248   LEUKOCYTESUR LARGE (A) 01/14/2022 0248    Sepsis Labs: Lactic Acid, Venous    Component Value Date/Time   LATICACIDVEN 1.0 12/31/2021 0048    MICROBIOLOGY: No results found for this or any previous visit (from the past 240 hours).   RADIOLOGY STUDIES/RESULTS: No results found.     LOS: 11 days   Kimberly Penna, MD  [facility]    To contact the attending provider between 7A-7P or the covering provider during after hours 7P-7A, please log into the web site [email or web address] and access using universal [place] password for that web site. If you do not have the password, please call the hospital operator.  02/19/2024, 11:09 AM

## 2024-02-20 DIAGNOSIS — E119 Type 2 diabetes mellitus without complications: Secondary | ICD-10-CM | POA: Diagnosis not present

## 2024-02-20 DIAGNOSIS — I10 Essential (primary) hypertension: Secondary | ICD-10-CM | POA: Diagnosis not present

## 2024-02-20 DIAGNOSIS — M4624 Osteomyelitis of vertebra, thoracic region: Secondary | ICD-10-CM | POA: Diagnosis not present

## 2024-02-20 DIAGNOSIS — E039 Hypothyroidism, unspecified: Secondary | ICD-10-CM | POA: Diagnosis not present

## 2024-02-20 LAB — CBC
HCT: 26.6 % — ABNORMAL LOW (ref 39.0–52.0)
Hemoglobin: 8.5 g/dL — ABNORMAL LOW (ref 13.0–17.0)
MCH: 33.7 pg (ref 26.0–34.0)
MCHC: 32 g/dL (ref 30.0–36.0)
MCV: 105.6 fL — ABNORMAL HIGH (ref 80.0–100.0)
Platelets: 151 10*3/uL (ref 150–400)
RBC: 2.52 MIL/uL — ABNORMAL LOW (ref 4.22–5.81)
RDW: 14 % (ref 11.5–15.5)
WBC: 6.4 10*3/uL (ref 4.0–10.5)
nRBC: 0 % (ref 0.0–0.2)

## 2024-02-20 LAB — RENAL FUNCTION PANEL
Albumin: 3.2 g/dL — ABNORMAL LOW (ref 3.5–5.0)
Anion gap: 12 (ref 5–15)
BUN: 52 mg/dL — ABNORMAL HIGH (ref 8–23)
CO2: 24 mmol/L (ref 22–32)
Calcium: 8.4 mg/dL — ABNORMAL LOW (ref 8.9–10.3)
Chloride: 101 mmol/L (ref 98–111)
Creatinine, Ser: 7.1 mg/dL — ABNORMAL HIGH (ref 0.61–1.24)
GFR, Estimated: 8 mL/min — ABNORMAL LOW (ref >60)
Glucose, Bld: 84 mg/dL (ref 70–99)
Phosphorus: 7.8 mg/dL — ABNORMAL HIGH (ref 2.5–4.6)
Potassium: 5.9 mmol/L — ABNORMAL HIGH (ref 3.5–5.1)
Sodium: 137 mmol/L (ref 135–145)

## 2024-02-20 LAB — GLUCOSE, CAPILLARY: Glucose-Capillary: 236 mg/dL — ABNORMAL HIGH (ref 70–99)

## 2024-02-20 MED ORDER — HYDROMORPHONE HCL 1 MG/ML IJ SOLN
INTRAMUSCULAR | Status: AC
  Filled 2024-02-20: qty 1

## 2024-02-20 MED ORDER — HYDROMORPHONE HCL 1 MG/ML IJ SOLN
0.5000 mg | INTRAMUSCULAR | Status: DC | PRN
  Administered 2024-02-20 – 2024-02-24 (×20): 0.5 mg via INTRAVENOUS
  Filled 2024-02-20 (×19): qty 0.5

## 2024-02-20 MED ORDER — SODIUM ZIRCONIUM CYCLOSILICATE 10 G PO PACK
10.0000 g | PACK | Freq: Every day | ORAL | Status: AC
  Administered 2024-02-20: 10 g via ORAL
  Filled 2024-02-20: qty 1

## 2024-02-20 MED ORDER — PENTAFLUOROPROP-TETRAFLUOROETH EX AERO: 1.0000 | INHALATION_SPRAY | CUTANEOUS | Status: DC | PRN

## 2024-02-20 MED ORDER — ANTICOAGULANT SODIUM CITRATE 4% (200MG/5ML) IV SOLN: 5.0000 mL | Status: DC | PRN

## 2024-02-20 MED ORDER — HEPARIN SODIUM (PORCINE) 1000 UNIT/ML DIALYSIS
1000.0000 [IU] | INTRAMUSCULAR | Status: DC | PRN
  Administered 2024-02-20: 3200 [IU]

## 2024-02-20 MED ORDER — VANCOMYCIN HCL IN DEXTROSE 1-5 GM/200ML-% IV SOLN
INTRAVENOUS | Status: AC
  Filled 2024-02-20: qty 200

## 2024-02-20 MED ORDER — LIDOCAINE-PRILOCAINE 2.5-2.5 % EX CREA: 1.0000 | TOPICAL_CREAM | CUTANEOUS | Status: DC | PRN

## 2024-02-20 MED ORDER — NEPRO/CARBSTEADY PO LIQD: 237.0000 mL | ORAL | Status: DC | PRN

## 2024-02-20 MED ORDER — HEPARIN SODIUM (PORCINE) 1000 UNIT/ML IJ SOLN
INTRAMUSCULAR | Status: AC
  Filled 2024-02-20: qty 4

## 2024-02-20 NOTE — Progress Notes (Signed)
 [facility] Progress Note   Subjective:  Seen in HD unit No new c/o's  Objective Vitals:   02/20/24 1023 02/20/24 1030 02/20/24 1100 02/20/24 1130  BP: (!) 115/97 (!) 115/97 100/71 119/73  Pulse: 85     Resp: 12     Temp:      TempSrc:      SpO2:      Weight: 91.8 kg     Height:        Physical Exam General:Chronically ill appearing male, NAD. Room air Heart: RRR; 2/6 murmur Lungs: CTAB Abdomen: soft, ntnd Extremities: R BKA, + L TMA wrapped Dialysis Access: TDC drsg intact  Dialysis: TTS [place] 4h  B400  92kg  TDC  Heparin  none - Mircera 225mcg IV q 2 weeks (last 4/9) - Calcitriol  0.72mcg PO q HD   Assessment/Plan:  Thoracic discitis/OM: recent acute on chronic back pain, s/p fall on 4/20. CT imaging showed T9-10 discitis. He refused MRI, area too small for aspiration per IR. Earlier this year he completed an 8 week course of Dapto/Cefepime  on 3/20 for bacteremia presumed due to foot infection. Concerned that this infection may have seeded the spine. Blood cx's were negative. Per ortho pt had L TMA on 4/30. Per ID needs IV Vanc/Cefepime  x 6 weeks thru 03/23/24 for the discitis.   ESRD:  Usual TTS schedule. HD today.   HTN/volume: BP ok, no vol ^ on exam. UF 2- 3 L w/ next HD  Anemia: HGB 10.2. Follow labs. On weekly ESA. No venofer in setting of infection.  Metabolic bone disease: Ca ok, Phos high - resumed home binders.  Nutrition: Alb 3.4, follow.  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  [name]  MD  [facility] 02/20/2024, 12:33 PM  Recent Labs  Lab 02/18/24 0504 02/20/24 0446  HGB 9.6* 8.5*  ALBUMIN  3.4* 3.2*  CALCIUM  8.5* 8.4*  PHOS 6.9* 7.8*  CREATININE 7.75* 7.10*  K 5.1 5.9*    Inpatient medications:  amLODipine   5 mg Oral Daily   vitamin C   1,000 mg Oral Daily   atorvastatin   80 mg Oral Daily   bethanechol   10 mg Oral BID   Chlorhexidine  Gluconate Cloth  6 each Topical Q0600   darbepoetin (ARANESP ) injection - DIALYSIS  100  mcg Subcutaneous Q Sat-1800   heparin  injection (subcutaneous)  5,000 Units Subcutaneous Q8H   lactulose   30 g Oral Daily   leptospermum manuka honey  1 Application Topical Daily   levothyroxine   50 mcg Oral QAC breakfast   naloxegol  oxalate  12.5 mg Oral Daily   nutrition supplement (JUVEN)  1 packet Oral BID BM   oxyCODONE   30 mg Oral Q8H   polyethylene glycol  17 g Oral BID   senna-docusate  4 tablet Oral BID   sevelamer  carbonate  1,600 mg Oral TID WC   tamsulosin   0.4 mg Oral Daily   zinc  sulfate (50mg  elemental zinc )  220 mg Oral Daily    sodium chloride  10 mL/hr at 02/17/24 1600   anticoagulant sodium citrate      ceFEPime  (MAXIPIME ) IV 2 g (02/18/24 2147)   promethazine  (PHENERGAN ) injection (IM or IVPB)     vancomycin  1,000 mg (02/18/24 2234)   acetaminophen  **OR** acetaminophen , anticoagulant sodium citrate , bisacodyl , feeding supplement (NEPRO CARB STEADY), heparin , HYDROmorphone  (DILAUDID ) injection, lidocaine -prilocaine , melatonin, naLOXone  (NARCAN )  injection, ondansetron  (ZOFRAN ) IV, oxyCODONE , pentafluoroprop-tetrafluoroeth, promethazine  (PHENERGAN ) injection (IM or IVPB), tiZANidine 

## 2024-02-20 NOTE — Plan of Care (Signed)
  Problem: Education: Goal: Ability to describe self-care measures that may prevent or decrease complications (Diabetes Survival Skills Education) will improve Outcome: Progressing Goal: Individualized Educational Video(s) Outcome: Progressing   Problem: Coping: Goal: Ability to adjust to condition or change in health will improve Outcome: Progressing   Problem: Fluid Volume: Goal: Ability to maintain a balanced intake and output will improve Outcome: Progressing   Problem: Health Behavior/Discharge Planning: Goal: Ability to identify and utilize available resources and services will improve Outcome: Progressing Goal: Ability to manage health-related needs will improve Outcome: Progressing   Problem: Metabolic: Goal: Ability to maintain appropriate glucose levels will improve Outcome: Progressing   Problem: Nutritional: Goal: Maintenance of adequate nutrition will improve Outcome: Progressing Goal: Progress toward achieving an optimal weight will improve Outcome: Progressing   Problem: Skin Integrity: Goal: Risk for impaired skin integrity will decrease Outcome: Progressing   Problem: Tissue Perfusion: Goal: Adequacy of tissue perfusion will improve Outcome: Progressing   Problem: Education: Goal: Knowledge of General Education information will improve Description: Including pain rating scale, medication(s)/side effects and non-pharmacologic comfort measures Outcome: Progressing   Problem: Health Behavior/Discharge Planning: Goal: Ability to manage health-related needs will improve Outcome: Progressing   Problem: Clinical Measurements: Goal: Ability to maintain clinical measurements within normal limits will improve Outcome: Progressing Goal: Will remain free from infection Outcome: Progressing Goal: Diagnostic test results will improve Outcome: Progressing Goal: Respiratory complications will improve Outcome: Progressing Goal: Cardiovascular complication will  be avoided Outcome: Progressing   Problem: Activity: Goal: Risk for activity intolerance will decrease Outcome: Progressing   Problem: Nutrition: Goal: Adequate nutrition will be maintained Outcome: Progressing   Problem: Coping: Goal: Level of anxiety will decrease Outcome: Progressing   Problem: Elimination: Goal: Will not experience complications related to bowel motility Outcome: Progressing Goal: Will not experience complications related to urinary retention Outcome: Progressing   Problem: Pain Managment: Goal: General experience of comfort will improve and/or be controlled Outcome: Progressing   Problem: Safety: Goal: Ability to remain free from injury will improve Outcome: Progressing   Problem: Skin Integrity: Goal: Risk for impaired skin integrity will decrease Outcome: Progressing   Problem: Education: Goal: Knowledge of the prescribed therapeutic regimen will improve Outcome: Progressing Goal: Ability to verbalize activity precautions or restrictions will improve Outcome: Progressing Goal: Understanding of discharge needs will improve Outcome: Progressing   Problem: Activity: Goal: Ability to perform//tolerate increased activity and mobilize with assistive devices will improve Outcome: Progressing   Problem: Clinical Measurements: Goal: Postoperative complications will be avoided or minimized Outcome: Progressing   Problem: Self-Care: Goal: Ability to meet self-care needs will improve Outcome: Progressing   Problem: Self-Concept: Goal: Ability to maintain and perform role responsibilities to the fullest extent possible will improve Outcome: Progressing   Problem: Pain Management: Goal: Pain level will decrease with appropriate interventions Outcome: Progressing   Problem: Skin Integrity: Goal: Demonstration of wound healing without infection will improve Outcome: Progressing

## 2024-02-20 NOTE — Plan of Care (Signed)
 Pt has rested quietly throughout the night with no distress noted. Alert and oriented. On room air. SR on the monitor. Voids per urinal. Medicated three times with dilaudid  for pain with relief noted. No other complaints voiced.     Problem: Metabolic: Goal: Ability to maintain appropriate glucose levels will improve Outcome: Progressing   Problem: Education: Goal: Knowledge of General Education information will improve Description: Including pain rating scale, medication(s)/side effects and non-pharmacologic comfort measures Outcome: Progressing   Problem: Clinical Measurements: Goal: Respiratory complications will improve Outcome: Progressing Goal: Cardiovascular complication will be avoided Outcome: Progressing   Problem: Pain Managment: Goal: General experience of comfort will improve and/or be controlled Outcome: Progressing

## 2024-02-20 NOTE — Progress Notes (Addendum)
 Received patient in bed.Alert and oriented x 4.Consent verified.  Access used: Right hd catheter that worked well.Dressing on date.  Duration of treatment: 3 hours.  UF goal: 1.6  instead of prescribed 2 liters.  Medicines given : Vancomycin  1 g=200 CC                               Mixipime 2g=150 CC                               Hydromorphone  1mg .  Hemo comment: Midway to the treatment ,patient started shouting due to his extremities cramping.Net uf at this time 850 cc.Flush NS 200 CC given,and his pain med given,but has no  real effect on relieving his cramping,ended up doing intermittent uf as he tolerated thus net uf of 1600 cc.  Hand off to the patient's nurse with stable condition via transporter.

## 2024-02-20 NOTE — Progress Notes (Signed)
 PROGRESS NOTE        PATIENT DETAILS Name: Thomas [name] Age: 73 y.o. Sex: male Date of Birth: Apr 03, 1951 Admit Date: 02/07/2024 Admitting Physician Roxana Copier, DO [id number], [name], MD  Brief Summary: Patient is a 73 y.o.  male who with history of ESRD on HD TTS, history of posterior fusion at T10-12 due to osteomyelitis/discitis/epidural abscess in 2021, history of right BKA who recently completed 6 weeks of daptomycin /cefepime  on 3/28 for left foot infection with osteomyelitis-presented with low back pain-upon further evaluation with CT imaging-he was found to have acute osteomyelitis involving his thoracic spine and admitted to the hospitalist service.  Significant events: 4/20>> admit to [facility]-back pain-concern for thoracic osteomyelitis.  Significant studies: 4/21>> x-ray right elbow: No fracture. 4/21>> x-ray left wrist: No fracture 4/21>> x-ray left shoulder, no fracture. 4/21>> CT head: No acute intracranial abnormality 4/21>> CT C-spine: No fracture. 4/21>> CT abdomen/pelvis: No acute injury to chest/abdomen/pelvis 4/21>> CT T-spine: Prior posterior fusion from T10-T12-abnormal appearance with collapse of T10-lucency around T10 screws at the T9-10 disc space, T10-11 disc space concerning for discitis/osteomyelitis. 4/21>> CT L-spine: No acute bony abnormality, mild chronic compression fracture at L1. 4/22>> x-ray left foot: Chronic erosive changes about the forefoot similar to 11/21/2023-osteomyelitis/septic arthritis of the 1st/2nd MTP joints difficult to exclude. 4/25>> left ABI: Within normal limit.  Significant microbiology data: 4/22>> blood culture: No growth  Procedures: 4/30>> left transmetatarsal amputation by Dr. Julio Ohm.  Consults: Neurosurgery Infectious disease Ortho  Subjective: No major issues overnight-pain relatively well-controlled after starting long-acting OxyContin .  Still very reluctant to commit to going to SNF-he  lives alone-is essentially nonambulatory given his weightbearing status and right AKA-has no family to help him at this point.  After extensive discussion today-he acknowledges that he will need to go to SNF sometime next week-claims that he will reach out to the social worker for further discussion.    Objective: Vitals: Blood pressure 119/73, pulse 85, temperature 98.5 F (36.9 C), temperature source Oral, resp. rate 12, height 6\' 5"  (1.956 m), weight 91.8 kg, SpO2 92%.   Exam: Awake/alert Chest: Clear to auscultation CVS: S1-S2 regular Abdomen: Soft nontender nondistended Extremities: Left TMA dressing in place, s/p right AKA.  Pertinent Labs/Radiology:    Latest Ref Rng & Units 02/20/2024    4:46 AM 02/18/2024    5:04 AM 02/16/2024    4:06 AM  CBC  WBC 4.0 - 10.5 K/uL 6.4  6.7  5.4   Hemoglobin 13.0 - 17.0 g/dL 8.5  9.6  69.6   Hematocrit 39.0 - 52.0 % 26.6  29.9  31.1   Platelets 150 - 400 K/uL 151  151  155     Lab Results  Component Value Date   NA 137 02/20/2024   K 5.9 (H) 02/20/2024   CL 101 02/20/2024   CO2 24 02/20/2024    Assessment/Plan: Acute osteomyelitis with discitis involving T9 to T11 vertebra Per neurosurgery not an operative candidate Per IR-no window to aspirate Plan is for 6 weeks of vancomycin /cefepime  with hemodialysis-EOT 6/4 Blood cultures negative so far Back pain better controlled-he is back on his OxyContin  30 mg p.o. 3 times daily-reduce Dilaudid  to 0.5 mg for severe breakthrough pain-continue oxycodone  for moderate breakthrough pain.    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 Chronic left second toe ulceration He just completed 6 weeks  of daptomycin /cefepime  on 3/28-has refused amputation in the past.  Concern is that he may have seeded his spine through foot infection.  Subsequently evaluated by Dr. Julio Ohm on 4/28-underwent TMA on 4/30.  Transient hypotension on 4/30 Likely secondary to antihypertensives-and hemodialysis that  he had the day prior All antihypertensives was held-given midodrine  x 1-gentle IV fluid bolus given-blood pressure now stable BP now noted to creep up-amlodipine  was resumed on 5/1-clonidine /losartan /metoprolol  remains on hold-resume when able  Constipation Chronic issue-secondary to narcotics Finally had a BM after several days-remains on MiraLAX /senna-Movantik  added on 5/2. Dulcolax suppository as needed.  Mechanical fall Numerous imaging studies negative for fracture PT/OT eval  ESRD on HD TTS Nephrology following and directing HD care  Hyperkalemia Getting HD this morning. Repeat labs periodically.  Prolonged QTc Twelve-lead EKG on 4/30 with normal QTc Telemetry monitoring  Normocytic anemia Secondary to ESRD Aranesp /iron  deferred to nephrology service.  Chronic HFpEF Euvolemic on exam Fluid removal with HD  HTN See above regarding hypotension BP relatively stable-see above discussion.  HLD Statin  Hypothyroidism Synthroid   BPH Flomax /bethanechol   History of right BKA  Debility/deconditioning Very reluctant to go to SNF-difficult situation-he has s/p right AKA-now left TMA several days ago-with touchdown weightbearing status.  Lives alone-minimal family assistance-his brother helps him some.  After extensive discussion-he seems a bit more accepting of the fact that he may have to go to SNF short-term.  BMI: Estimated body mass index is 24 kg/m as calculated from the following:   Height as of this encounter: 6\' 5"  (1.956 m).   Weight as of this encounter: 91.8 kg.   Code status:   Code Status: Full Code   DVT Prophylaxis: SCD's Start: 02/17/24 1538 heparin  injection 5,000 Units Start: 02/10/24 1400 SCDs Start: 02/08/24 2206   Family Communication: Brother-[phone] updated 5/1   Disposition Plan: Status is: Inpatient Remains inpatient appropriate because: Severity of illness   Planned Discharge Destination: SNF   Diet: Diet Order              Diet regular Fluid consistency: Thin  Diet effective now                     Antimicrobial agents: Anti-infectives (From admission, onward)    Start     Dose/Rate Route Frequency Ordered Stop   02/11/24 1800  ceFEPIme  (MAXIPIME ) 2 g in sodium chloride  0.9 % 100 mL IVPB        2 g 200 mL/hr over 30 Minutes Intravenous Every T-Th-Sa (Hemodialysis) 02/10/24 1028     02/11/24 1200  vancomycin  (VANCOCIN ) IVPB 1000 mg/200 mL premix        1,000 mg 200 mL/hr over 60 Minutes Intravenous Every T-Th-Sa (Hemodialysis) 02/10/24 1028     02/11/24 0000  ceFEPime  (MAXIPIME ) IVPB        2 g Intravenous Every T-Th-Sa (Hemodialysis) 02/11/24 0747 03/23/24 2359   02/11/24 0000  vancomycin  IVPB        1,000 mg Intravenous Every T-Th-Sa (Hemodialysis) 02/11/24 0747 03/23/24 2359   02/10/24 1115  vancomycin  (VANCOREADY) IVPB 2000 mg/400 mL        2,000 mg 200 mL/hr over 120 Minutes Intravenous  Once 02/10/24 1028 02/10/24 1446   02/10/24 1115  ceFEPIme  (MAXIPIME ) 2 g in sodium chloride  0.9 % 100 mL IVPB        2 g 200 mL/hr over 30 Minutes Intravenous  Once 02/10/24 1028 02/10/24 1239        MEDICATIONS: Scheduled Meds:  amLODipine   5 mg Oral Daily   vitamin C   1,000 mg Oral Daily   atorvastatin   80 mg Oral Daily   bethanechol   10 mg Oral BID   Chlorhexidine  Gluconate Cloth  6 each Topical Q0600   darbepoetin (ARANESP ) injection - DIALYSIS  100 mcg Subcutaneous Q Sat-1800   heparin  injection (subcutaneous)  5,000 Units Subcutaneous Q8H   lactulose   30 g Oral Daily   leptospermum manuka honey  1 Application Topical Daily   levothyroxine   50 mcg Oral QAC breakfast   naloxegol  oxalate  12.5 mg Oral Daily   nutrition supplement (JUVEN)  1 packet Oral BID BM   oxyCODONE   30 mg Oral Q8H   polyethylene glycol  17 g Oral BID   senna-docusate  4 tablet Oral BID   sevelamer  carbonate  1,600 mg Oral TID WC   tamsulosin   0.4 mg Oral Daily   zinc  sulfate (50mg  elemental zinc )  220 mg  Oral Daily   Continuous Infusions:  sodium chloride  10 mL/hr at 02/17/24 1600   anticoagulant sodium citrate      ceFEPime  (MAXIPIME ) IV 2 g (02/18/24 2147)   promethazine  (PHENERGAN ) injection (IM or IVPB)     vancomycin  1,000 mg (02/18/24 2234)   PRN Meds:.acetaminophen  **OR** acetaminophen , anticoagulant sodium citrate , bisacodyl , feeding supplement (NEPRO CARB STEADY), heparin , HYDROmorphone  (DILAUDID ) injection, lidocaine -prilocaine , melatonin, naLOXone  (NARCAN )  injection, ondansetron  (ZOFRAN ) IV, oxyCODONE , pentafluoroprop-tetrafluoroeth, promethazine  (PHENERGAN ) injection (IM or IVPB), tiZANidine    I have personally reviewed following labs and imaging studies  LABORATORY DATA: CBC: Recent Labs  Lab 02/14/24 0612 02/16/24 0406 02/18/24 0504 02/20/24 0446  WBC 6.0 5.4 6.7 6.4  HGB 10.2* 10.2* 9.6* 8.5*  HCT 31.4* 31.1* 29.9* 26.6*  MCV 105.4* 104.0* 105.7* 105.6*  PLT 142* 155 151 151    Basic Metabolic Panel: Recent Labs  Lab 02/13/24 1343 02/16/24 0406 02/18/24 0504 02/20/24 0446  NA 136 136 139 137  K 4.6 5.9* 5.1 5.9*  CL 99 100 102 101  CO2 22 23 20* 24  GLUCOSE 125* 87 91 84  BUN 57* 62* 63* 52*  CREATININE 7.31* 8.09* 7.75* 7.10*  CALCIUM  8.1* 8.6* 8.5* 8.4*  PHOS  --  7.3* 6.9* 7.8*    GFR: Estimated Creatinine Clearance: 12.2 mL/min (A) (by C-G formula based on SCr of 7.1 mg/dL (H)).  Liver Function Tests: Recent Labs  Lab 02/16/24 0406 02/18/24 0504 02/20/24 0446  ALBUMIN  3.2* 3.4* 3.2*   No results for input(s): "LIPASE", "AMYLASE" in the last 168 hours. No results for input(s): "AMMONIA" in the last 168 hours.  Coagulation Profile: No results for input(s): "INR", "PROTIME" in the last 168 hours.   Cardiac Enzymes: No results for input(s): "CKTOTAL", "CKMB", "CKMBINDEX", "TROPONINI" in the last 168 hours.  BNP (last 3 results) No results for input(s): "PROBNP" in the last 8760 hours.  Lipid Profile: No results for input(s):  "CHOL", "HDL", "LDLCALC", "TRIG", "CHOLHDL", "LDLDIRECT" in the last 72 hours.  Thyroid Function Tests: No results for input(s): "TSH", "T4TOTAL", "FREET4", "T3FREE", "THYROIDAB" in the last 72 hours.  Anemia Panel: No results for input(s): "VITAMINB12", "FOLATE", "FERRITIN", "TIBC", "IRON ", "RETICCTPCT" in the last 72 hours.   Urine analysis:    Component Value Date/Time   COLORURINE YELLOW 01/14/2022 0248   APPEARANCEUR HAZY (A) 01/14/2022 0248   LABSPEC 1.009 01/14/2022 0248   PHURINE 7.0 01/14/2022 0248   GLUCOSEU NEGATIVE 01/14/2022 0248   HGBUR MODERATE (A) 01/14/2022 0248   BILIRUBINUR NEGATIVE 01/14/2022 0248   Brena Calk  NEGATIVE 01/14/2022 0248   PROTEINUR 100 (A) 01/14/2022 0248   NITRITE NEGATIVE 01/14/2022 0248   LEUKOCYTESUR LARGE (A) 01/14/2022 0248    Sepsis Labs: Lactic Acid, Venous    Component Value Date/Time   LATICACIDVEN 1.0 12/31/2021 0048    MICROBIOLOGY: No results found for this or any previous visit (from the past 240 hours).   RADIOLOGY STUDIES/RESULTS: No results found.     LOS: 12 days   Kimberly Penna, MD  [facility]    To contact the attending provider between 7A-7P or the covering provider during after hours 7P-7A, please log into the web site [email or web address] and access using universal Susan Moore password for that web site. If you do not have the password, please call the hospital operator.  02/20/2024, 12:21 PM

## 2024-02-21 DIAGNOSIS — M4624 Osteomyelitis of vertebra, thoracic region: Secondary | ICD-10-CM | POA: Diagnosis not present

## 2024-02-21 LAB — GLUCOSE, CAPILLARY
Glucose-Capillary: 108 mg/dL — ABNORMAL HIGH (ref 70–99)
Glucose-Capillary: 132 mg/dL — ABNORMAL HIGH (ref 70–99)
Glucose-Capillary: 91 mg/dL (ref 70–99)

## 2024-02-21 NOTE — Progress Notes (Signed)
 PROGRESS NOTE        PATIENT DETAILS Name: Thomas [name] Age: 73 y.o. Sex: male Date of Birth: 12/06/1950 Admit Date: 02/07/2024 Admitting Physician Roxana Copier, DO [id number], [name], MD  Brief Summary: Patient is a 73 y.o.  male who with history of ESRD on HD TTS, history of posterior fusion at T10-12 due to osteomyelitis/discitis/epidural abscess in 2021, history of right BKA who recently completed 6 weeks of daptomycin /cefepime  on 3/28 for left foot infection with osteomyelitis-presented with low back pain-upon further evaluation with CT imaging-he was found to have acute osteomyelitis involving his thoracic spine and admitted to the hospitalist service.  Significant events: 4/20>> admit to [facility]-back pain-concern for thoracic osteomyelitis.  Significant studies: 4/21>> x-ray right elbow: No fracture. 4/21>> x-ray left wrist: No fracture 4/21>> x-ray left shoulder, no fracture. 4/21>> CT head: No acute intracranial abnormality 4/21>> CT C-spine: No fracture. 4/21>> CT abdomen/pelvis: No acute injury to chest/abdomen/pelvis 4/21>> CT T-spine: Prior posterior fusion from T10-T12-abnormal appearance with collapse of T10-lucency around T10 screws at the T9-10 disc space, T10-11 disc space concerning for discitis/osteomyelitis. 4/21>> CT L-spine: No acute bony abnormality, mild chronic compression fracture at L1. 4/22>> x-ray left foot: Chronic erosive changes about the forefoot similar to 11/21/2023-osteomyelitis/septic arthritis of the 1st/2nd MTP joints difficult to exclude. 4/25>> left ABI: Within normal limit.  Significant microbiology data: 4/22>> blood culture: No growth  Procedures: 4/30>> left transmetatarsal amputation by Dr. Julio Ohm.  Consults: Neurosurgery Infectious disease Ortho  Subjective: Patient sitting up in bed, in no distress whatsoever, asking for IV pain medicine for ongoing pains, no chest pain or shortness of  breath.  Objective: Vitals: Blood pressure 139/88, pulse 75, temperature 98.7 F (37.1 C), temperature source Oral, resp. rate 17, height 6\' 5"  (1.956 m), weight 91.8 kg, SpO2 97%.   Exam:  Awake/alert, in absolutely no distress Chest: Clear to auscultation CVS: S1-S2 regular Abdomen: Soft nontender nondistended Extremities: Left TMA dressing in place, s/p right AKA.  ment/Plan:  Acute osteomyelitis with discitis involving T9 to T11 vertebra Per neurosurgery not an operative candidate Per IR-no window to aspirate Plan is for 6 weeks of vancomycin /cefepime  with hemodialysis-EOT 6/4 Blood cultures negative so far Back pain better controlled-he is back on his OxyContin  30 mg p.o. 3 times daily-reduce Dilaudid  to 0.5 mg for severe breakthrough pain-continue oxycodone  for moderate breakthrough pain.   Note patient is on high doses of narcotics chronically, exhibits some narcotic seeking behavior, counseled to avoid excessive use to avoid accidental overdose, as needed Narcan  on board.  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 Chronic left second toe ulceration He just completed 6 weeks of daptomycin /cefepime  on 3/28-has refused amputation in the past.  Concern is that he may have seeded his spine through foot infection.  Subsequently evaluated by Dr. Julio Ohm on 4/28-underwent TMA on 4/30.  Transient hypotension on 4/30 Likely secondary to antihypertensives-and hemodialysis that he had the day prior All antihypertensives was held-given midodrine  x 1-gentle IV fluid bolus given-blood pressure now stable BP now noted to creep up-amlodipine  was resumed on 5/1-clonidine /losartan /metoprolol  remains on hold-resume when able  Constipation Chronic issue-secondary to narcotics Finally had a BM after several days-remains on MiraLAX /senna-Movantik  added on 5/2. Dulcolax suppository as needed.  Mechanical fall Numerous imaging studies negative for fracture PT/OT eval  ESRD on HD  TTS Nephrology following and directing HD care  Hyperkalemia on 02/20/2024 He underwent HD 02/20/2024 afternoon, defer to nephrology  Prolonged QTc Twelve-lead EKG on 4/30 with normal QTc Telemetry monitoring  Normocytic anemia Secondary to ESRD Aranesp /iron  deferred to nephrology service.  Chronic HFpEF Euvolemic on exam Fluid removal with HD  HTN See above regarding hypotension BP relatively stable-see above discussion.  HLD Statin  Hypothyroidism Synthroid   BPH Flomax /bethanechol   History of right BKA  Debility/deconditioning Very reluctant to go to SNF-difficult situation-he has s/p right AKA-now left TMA several days ago-with touchdown weightbearing status.  Lives alone-minimal family assistance-his brother helps him some.  After extensive discussion-he seems a bit more accepting of the fact that he may have to go to SNF short-term.  BMI: Estimated body mass index is 24 kg/m as calculated from the following:   Height as of this encounter: 6\' 5"  (1.956 m).   Weight as of this encounter: 91.8 kg.   Code status:   Code Status: Full Code   DVT Prophylaxis: SCD's Start: 02/17/24 1538 heparin  injection 5,000 Units Start: 02/10/24 1400 SCDs Start: 02/08/24 2206   Family Communication: Brother-[phone] updated 5/1   Disposition Plan: Status is: Inpatient Remains inpatient appropriate because: Severity of illness   Planned Discharge Destination: SNF   Diet: Diet Order             Diet regular Fluid consistency: Thin  Diet effective now                     Antimicrobial agents: Anti-infectives (From admission, onward)    Start     Dose/Rate Route Frequency Ordered Stop   02/11/24 1800  ceFEPIme  (MAXIPIME ) 2 g in sodium chloride  0.9 % 100 mL IVPB        2 g 200 mL/hr over 30 Minutes Intravenous Every T-Th-Sa (Hemodialysis) 02/10/24 1028     02/11/24 1200  vancomycin  (VANCOCIN ) IVPB 1000 mg/200 mL premix        1,000 mg 200 mL/hr over 60  Minutes Intravenous Every T-Th-Sa (Hemodialysis) 02/10/24 1028     02/11/24 0000  ceFEPime  (MAXIPIME ) IVPB        2 g Intravenous Every T-Th-Sa (Hemodialysis) 02/11/24 0747 03/23/24 2359   02/11/24 0000  vancomycin  IVPB        1,000 mg Intravenous Every T-Th-Sa (Hemodialysis) 02/11/24 0747 03/23/24 2359   02/10/24 1115  vancomycin  (VANCOREADY) IVPB 2000 mg/400 mL        2,000 mg 200 mL/hr over 120 Minutes Intravenous  Once 02/10/24 1028 02/10/24 1446   02/10/24 1115  ceFEPIme  (MAXIPIME ) 2 g in sodium chloride  0.9 % 100 mL IVPB        2 g 200 mL/hr over 30 Minutes Intravenous  Once 02/10/24 1028 02/10/24 1239        MEDICATIONS: Scheduled Meds:  amLODipine   5 mg Oral Daily   vitamin C   1,000 mg Oral Daily   atorvastatin   80 mg Oral Daily   bethanechol   10 mg Oral BID   Chlorhexidine  Gluconate Cloth  6 each Topical Q0600   darbepoetin (ARANESP ) injection - DIALYSIS  100 mcg Subcutaneous Q Sat-1800   heparin  injection (subcutaneous)  5,000 Units Subcutaneous Q8H   lactulose   30 g Oral Daily   leptospermum manuka honey  1 Application Topical Daily   levothyroxine   50 mcg Oral QAC breakfast   naloxegol  oxalate  12.5 mg Oral Daily   nutrition supplement (JUVEN)  1 packet Oral BID BM   oxyCODONE   30 mg Oral Q8H  polyethylene glycol  17 g Oral BID   senna-docusate  4 tablet Oral BID   sevelamer  carbonate  1,600 mg Oral TID WC   tamsulosin   0.4 mg Oral Daily   zinc  sulfate (50mg  elemental zinc )  220 mg Oral Daily   Continuous Infusions:  sodium chloride  10 mL/hr at 02/17/24 1600   ceFEPime  (MAXIPIME ) IV Stopped (02/20/24 1323)   promethazine  (PHENERGAN ) injection (IM or IVPB)     vancomycin  Stopped (02/20/24 1246)   PRN Meds:.acetaminophen  **OR** acetaminophen , bisacodyl , HYDROmorphone  (DILAUDID ) injection, melatonin, naLOXone  (NARCAN )  injection, ondansetron  (ZOFRAN ) IV, oxyCODONE , promethazine  (PHENERGAN ) injection (IM or IVPB), tiZANidine    I have personally reviewed  following labs and imaging studies  LABORATORY DATA: CBC: Recent Labs  Lab 02/16/24 0406 02/18/24 0504 02/20/24 0446  WBC 5.4 6.7 6.4  HGB 10.2* 9.6* 8.5*  HCT 31.1* 29.9* 26.6*  MCV 104.0* 105.7* 105.6*  PLT 155 151 151    Basic Metabolic Panel: Recent Labs  Lab 02/16/24 0406 02/18/24 0504 02/20/24 0446  NA 136 139 137  K 5.9* 5.1 5.9*  CL 100 102 101  CO2 23 20* 24  GLUCOSE 87 91 84  BUN 62* 63* 52*  CREATININE 8.09* 7.75* 7.10*  CALCIUM  8.6* 8.5* 8.4*  PHOS 7.3* 6.9* 7.8*    GFR: Estimated Creatinine Clearance: 12.2 mL/min (A) (by C-G formula based on SCr of 7.1 mg/dL (H)).  Liver Function Tests: Recent Labs  Lab 02/16/24 0406 02/18/24 0504 02/20/24 0446  ALBUMIN  3.2* 3.4* 3.2*   No results for input(s): "LIPASE", "AMYLASE" in the last 168 hours. No results for input(s): "AMMONIA" in the last 168 hours.  Coagulation Profile: No results for input(s): "INR", "PROTIME" in the last 168 hours.   Cardiac Enzymes: No results for input(s): "CKTOTAL", "CKMB", "CKMBINDEX", "TROPONINI" in the last 168 hours.  BNP (last 3 results) No results for input(s): "PROBNP" in the last 8760 hours.  Lipid Profile: No results for input(s): "CHOL", "HDL", "LDLCALC", "TRIG", "CHOLHDL", "LDLDIRECT" in the last 72 hours.  Thyroid Function Tests: No results for input(s): "TSH", "T4TOTAL", "FREET4", "T3FREE", "THYROIDAB" in the last 72 hours.  Anemia Panel: No results for input(s): "VITAMINB12", "FOLATE", "FERRITIN", "TIBC", "IRON ", "RETICCTPCT" in the last 72 hours.   Urine analysis:    Component Value Date/Time   COLORURINE YELLOW 01/14/2022 0248   APPEARANCEUR HAZY (A) 01/14/2022 0248   LABSPEC 1.009 01/14/2022 0248   PHURINE 7.0 01/14/2022 0248   GLUCOSEU NEGATIVE 01/14/2022 0248   HGBUR MODERATE (A) 01/14/2022 0248   BILIRUBINUR NEGATIVE 01/14/2022 0248   KETONESUR NEGATIVE 01/14/2022 0248   PROTEINUR 100 (A) 01/14/2022 0248   NITRITE NEGATIVE 01/14/2022 0248    LEUKOCYTESUR LARGE (A) 01/14/2022 0248    Sepsis Labs: Lactic Acid, Venous    Component Value Date/Time   LATICACIDVEN 1.0 12/31/2021 0048    MICROBIOLOGY: No results found for this or any previous visit (from the past 240 hours).   RADIOLOGY STUDIES/RESULTS: No results found.     LOS: 13 days   Signature  -    Lynnwood Sauer M.D on 02/21/2024 at 10:34 AM   -  To page go to [email or web address]

## 2024-02-21 NOTE — Progress Notes (Signed)
  [facility] Progress Note   Subjective:  Seen in room  Objective Vitals:   02/21/24 0401 02/21/24 0800 02/21/24 0858 02/21/24 1102  BP:  139/88  124/84  Pulse:  75  88  Resp:  16 17 18   Temp:  98.7 F (37.1 C)  98.1 F (36.7 C)  TempSrc:  Oral  Oral  SpO2:  97%  97%  Weight: 91.8 kg     Height:        Physical Exam General:Chronically ill appearing male, NAD. Room air Heart: RRR; 2/6 murmur Lungs: CTAB Abdomen: soft, ntnd Extremities: R BKA, + L TMA wrapped Dialysis Access: TDC drsg intact  Dialysis: TTS [place] 4h  B400  92kg  TDC  Heparin  none - Mircera 225mcg IV q 2 weeks (last 4/9) - Calcitriol  0.25mcg PO q HD   Assessment/Plan:  Thoracic discitis/OM: recent acute on chronic back pain, s/p fall on 4/20. CT imaging showed T9-10 discitis. He refused MRI, area was too small for aspiration per IR. Earlier this year he completed an 8 week course of Dapto/Cefepime  on 3/20 for bacteremia presumed due to foot infection. Concerned that this infection may have seeded the spine. Blood cx's were negative. Per ortho pt had L TMA on 4/30. Per ID needs IV Vanc/Cefepime  x 6 weeks thru 03/23/24 for the discitis.   ESRD:  Usual TTS schedule. HD today.   HTN/volume: bp's okay, 1.6 L UF yest w/ HD (goal 2.5). At dry wt.   Anemia: HGB 10.2. Follow labs. On weekly ESA. No venofer in setting of infection.  Metabolic bone disease: Ca ok, Phos high - resumed home binders.  Nutrition: Alb 3.4, follow.  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  Thomas Poag  MD  [facility] 02/21/2024, 11:32 AM  Recent Labs  Lab 02/18/24 0504 02/20/24 0446  HGB 9.6* 8.5*  ALBUMIN  3.4* 3.2*  CALCIUM  8.5* 8.4*  PHOS 6.9* 7.8*  CREATININE 7.75* 7.10*  K 5.1 5.9*    Inpatient medications:  amLODipine   5 mg Oral Daily   vitamin C   1,000 mg Oral Daily   atorvastatin   80 mg Oral Daily   bethanechol   10 mg Oral BID   Chlorhexidine  Gluconate Cloth  6 each Topical Q0600   darbepoetin  (ARANESP ) injection - DIALYSIS  100 mcg Subcutaneous Q Sat-1800   heparin  injection (subcutaneous)  5,000 Units Subcutaneous Q8H   lactulose   30 g Oral Daily   leptospermum manuka honey  1 Application Topical Daily   levothyroxine   50 mcg Oral QAC breakfast   naloxegol  oxalate  12.5 mg Oral Daily   nutrition supplement (JUVEN)  1 packet Oral BID BM   oxyCODONE   30 mg Oral Q8H   polyethylene glycol  17 g Oral BID   senna-docusate  4 tablet Oral BID   sevelamer  carbonate  1,600 mg Oral TID WC   tamsulosin   0.4 mg Oral Daily   zinc  sulfate (50mg  elemental zinc )  220 mg Oral Daily    sodium chloride  10 mL/hr at 02/17/24 1600   ceFEPime  (MAXIPIME ) IV Stopped (02/20/24 1323)   promethazine  (PHENERGAN ) injection (IM or IVPB)     vancomycin  Stopped (02/20/24 1246)   acetaminophen  **OR** acetaminophen , bisacodyl , HYDROmorphone  (DILAUDID ) injection, melatonin, naLOXone  (NARCAN )  injection, ondansetron  (ZOFRAN ) IV, oxyCODONE , promethazine  (PHENERGAN ) injection (IM or IVPB), tiZANidine

## 2024-02-21 NOTE — Plan of Care (Signed)
 Pt has rested quietly throughout the night with no distress noted. Alert and oriented. On room air. SR on the monitor. Voids per urinal. Medicated for pain with dilaudid  3 times with relief noted. No other complaints voiced.      Problem: Tissue Perfusion: Goal: Adequacy of tissue perfusion will improve Outcome: Progressing   Problem: Education: Goal: Knowledge of General Education information will improve Description: Including pain rating scale, medication(s)/side effects and non-pharmacologic comfort measures Outcome: Progressing   Problem: Clinical Measurements: Goal: Respiratory complications will improve Outcome: Progressing Goal: Cardiovascular complication will be avoided Outcome: Progressing   Problem: Pain Managment: Goal: General experience of comfort will improve and/or be controlled Outcome: Progressing

## 2024-02-22 DIAGNOSIS — M4624 Osteomyelitis of vertebra, thoracic region: Secondary | ICD-10-CM | POA: Diagnosis not present

## 2024-02-22 LAB — RENAL FUNCTION PANEL
Albumin: 3 g/dL — ABNORMAL LOW (ref 3.5–5.0)
Anion gap: 14 (ref 5–15)
BUN: 63 mg/dL — ABNORMAL HIGH (ref 8–23)
CO2: 24 mmol/L (ref 22–32)
Calcium: 8.9 mg/dL (ref 8.9–10.3)
Chloride: 101 mmol/L (ref 98–111)
Creatinine, Ser: 7.6 mg/dL — ABNORMAL HIGH (ref 0.61–1.24)
GFR, Estimated: 7 mL/min — ABNORMAL LOW (ref >60)
Glucose, Bld: 113 mg/dL — ABNORMAL HIGH (ref 70–99)
Phosphorus: 8.4 mg/dL — ABNORMAL HIGH (ref 2.5–4.6)
Potassium: 5.5 mmol/L — ABNORMAL HIGH (ref 3.5–5.1)
Sodium: 139 mmol/L (ref 135–145)

## 2024-02-22 LAB — CBC
HCT: 27.4 % — ABNORMAL LOW (ref 39.0–52.0)
Hemoglobin: 8.8 g/dL — ABNORMAL LOW (ref 13.0–17.0)
MCH: 34.5 pg — ABNORMAL HIGH (ref 26.0–34.0)
MCHC: 32.1 g/dL (ref 30.0–36.0)
MCV: 107.5 fL — ABNORMAL HIGH (ref 80.0–100.0)
Platelets: 165 10*3/uL (ref 150–400)
RBC: 2.55 MIL/uL — ABNORMAL LOW (ref 4.22–5.81)
RDW: 14.2 % (ref 11.5–15.5)
WBC: 5.7 10*3/uL (ref 4.0–10.5)
nRBC: 0 % (ref 0.0–0.2)

## 2024-02-22 LAB — GLUCOSE, CAPILLARY
Glucose-Capillary: 105 mg/dL — ABNORMAL HIGH (ref 70–99)
Glucose-Capillary: 110 mg/dL — ABNORMAL HIGH (ref 70–99)
Glucose-Capillary: 111 mg/dL — ABNORMAL HIGH (ref 70–99)
Glucose-Capillary: 148 mg/dL — ABNORMAL HIGH (ref 70–99)
Glucose-Capillary: 86 mg/dL (ref 70–99)

## 2024-02-22 LAB — VANCOMYCIN, RANDOM: Vancomycin Rm: 38 ug/mL

## 2024-02-22 MED ORDER — SODIUM ZIRCONIUM CYCLOSILICATE 10 G PO PACK
10.0000 g | PACK | Freq: Two times a day (BID) | ORAL | Status: AC
  Administered 2024-02-22 (×2): 10 g via ORAL
  Filled 2024-02-22 (×2): qty 1

## 2024-02-22 MED ORDER — CHLORHEXIDINE GLUCONATE CLOTH 2 % EX PADS
6.0000 | MEDICATED_PAD | Freq: Every day | CUTANEOUS | Status: DC
  Administered 2024-02-23 – 2024-02-24 (×2): 6 via TOPICAL

## 2024-02-22 MED ORDER — VANCOMYCIN HCL 750 MG/150ML IV SOLN
750.0000 mg | INTRAVENOUS | Status: DC
  Administered 2024-02-23 – 2024-03-08 (×7): 750 mg via INTRAVENOUS
  Filled 2024-02-22 (×7): qty 150

## 2024-02-22 MED ORDER — VANCOMYCIN IV (FOR PTA / DISCHARGE USE ONLY): 750.0000 mg | INTRAVENOUS | Status: DC

## 2024-02-22 NOTE — Progress Notes (Addendum)
 PROGRESS NOTE        PATIENT DETAILS Name: Thomas [name] Age: 73 y.o. Sex: male Date of Birth: April 27, 1951 Admit Date: 02/07/2024 Admitting Physician Roxana Copier, DO ZOX:WRUEAV, [name], MD  Brief Summary: Patient is a 73 y.o.  male who with history of ESRD on HD TTS, history of posterior fusion at T10-12 due to osteomyelitis/discitis/epidural abscess in 2021, history of right BKA who recently completed 6 weeks of daptomycin /cefepime  on 3/28 for left foot infection with osteomyelitis-presented with low back pain-upon further evaluation with CT imaging-he was found to have acute osteomyelitis involving his thoracic spine and admitted to the hospitalist service.  Significant events: 4/20>> admit to [facility]-back pain-concern for thoracic osteomyelitis.  Significant studies: 4/21>> x-ray right elbow: No fracture. 4/21>> x-ray left wrist: No fracture 4/21>> x-ray left shoulder, no fracture. 4/21>> CT head: No acute intracranial abnormality 4/21>> CT C-spine: No fracture. 4/21>> CT abdomen/pelvis: No acute injury to chest/abdomen/pelvis 4/21>> CT T-spine: Prior posterior fusion from T10-T12-abnormal appearance with collapse of T10-lucency around T10 screws at the T9-10 disc space, T10-11 disc space concerning for discitis/osteomyelitis. 4/21>> CT L-spine: No acute bony abnormality, mild chronic compression fracture at L1. 4/22>> x-ray left foot: Chronic erosive changes about the forefoot similar to 11/21/2023-osteomyelitis/septic arthritis of the 1st/2nd MTP joints difficult to exclude. 4/25>> left ABI: Within normal limit.  Significant microbiology data: 4/22>> blood culture: No growth  Procedures: 4/30>> left transmetatarsal amputation by Dr. Julio Ohm.  Consults: Neurosurgery Infectious disease Ortho  Subjective: Patient sleeping in bed, in no distress, woken up, denies any headache, chest or abdominal pain, some cramping in his arms and  legs.  Objective: Vitals: Blood pressure 116/70, pulse 73, temperature 98.2 F (36.8 C), temperature source Oral, resp. rate 13, height 6\' 5"  (1.956 m), weight 92 kg, SpO2 98%.   Exam:  Awake/alert, in absolutely no distress Chest: Clear to auscultation CVS: S1-S2 regular Abdomen: Soft nontender nondistended Extremities: Left TMA dressing in place, s/p chronic right AKA.  ment/Plan:  Acute osteomyelitis with discitis involving T9 to T11 vertebra Per neurosurgery not an operative candidate Per IR-no window to aspirate Plan is for 6 weeks of vancomycin /cefepime  with hemodialysis-EOT 6/4 Blood cultures negative so far Back pain better controlled-he is back on his OxyContin  30 mg p.o. 3 times daily-reduce Dilaudid  to 0.5 mg for severe breakthrough pain-continue oxycodone  for moderate breakthrough pain.   Note patient is on high doses of narcotics chronically, exhibits some narcotic seeking behavior, counseled to avoid excessive use to avoid accidental overdose, as needed Narcan  on board.  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 Chronic left second toe ulceration He just completed 6 weeks of daptomycin /cefepime  on 3/28-has refused amputation in the past.  Concern is that he may have seeded his spine through foot infection.  Subsequently evaluated by Dr. Julio Ohm on 4/28-underwent TMA on 4/30.  Transient hypotension on 4/30 Now stable on low-dose Norvasc ,-clonidine /losartan /metoprolol  remains on hold-continue to monitor and add as appropriate.  Constipation Chronic issue-secondary to narcotics Finally had a BM after several days-remains on MiraLAX /senna-Movantik  added on 5/2. Dulcolax suppository as needed.  Mechanical fall Numerous imaging studies negative for fracture PT/OT eval  ESRD on HD TTS Nephrology following and directing HD care  Hyperkalemia on 02/20/2024 He underwent HD 02/20/2024 afternoon, 2 doses of Lokelma  on 02/22/2024, defer to nephrology  Prolonged  QTc Twelve-lead EKG on 4/30 with  normal QTc Telemetry monitoring  Normocytic anemia Secondary to ESRD Aranesp /iron  deferred to nephrology service.  Chronic HFpEF Euvolemic on exam Fluid removal with HD  HTN See above regarding hypotension BP relatively stable-see above discussion.  HLD Statin  Hypothyroidism Synthroid   BPH Flomax /bethanechol   History of right BKA  Debility/deconditioning Very reluctant to go to SNF-difficult situation-he has s/p right AKA-now left TMA several days ago-with touchdown weightbearing status.  Lives alone-minimal family assistance-his brother helps him some.  After extensive discussion-he seems a bit more accepting of the fact that he may have to go to SNF short-term.  BMI: Estimated body mass index is 24.05 kg/m as calculated from the following:   Height as of this encounter: 6\' 5"  (1.956 m).   Weight as of this encounter: 92 kg.   Code status:   Code Status: Full Code   DVT Prophylaxis: SCD's Start: 02/17/24 1538 heparin  injection 5,000 Units Start: 02/10/24 1400 SCDs Start: 02/08/24 2206   Family Communication: Brother-BJ-[phone] updated 5/1   Disposition Plan: Status is: Inpatient Remains inpatient appropriate because: Severity of illness   Planned Discharge Destination: SNF   Diet: Diet Order             Diet renal with fluid restriction Fluid restriction: 1200 mL Fluid; Room service appropriate? Yes; Fluid consistency: Thin  Diet effective now                     Antimicrobial agents: Anti-infectives (From admission, onward)    Start     Dose/Rate Route Frequency Ordered Stop   02/11/24 1800  ceFEPIme  (MAXIPIME ) 2 g in sodium chloride  0.9 % 100 mL IVPB        2 g 200 mL/hr over 30 Minutes Intravenous Every T-Th-Sa (Hemodialysis) 02/10/24 1028     02/11/24 1200  vancomycin  (VANCOCIN ) IVPB 1000 mg/200 mL premix        1,000 mg 200 mL/hr over 60 Minutes Intravenous Every T-Th-Sa (Hemodialysis) 02/10/24  1028     02/11/24 0000  ceFEPime  (MAXIPIME ) IVPB        2 g Intravenous Every T-Th-Sa (Hemodialysis) 02/11/24 0747 03/23/24 2359   02/11/24 0000  vancomycin  IVPB        1,000 mg Intravenous Every T-Th-Sa (Hemodialysis) 02/11/24 0747 03/23/24 2359   02/10/24 1115  vancomycin  (VANCOREADY) IVPB 2000 mg/400 mL        2,000 mg 200 mL/hr over 120 Minutes Intravenous  Once 02/10/24 1028 02/10/24 1446   02/10/24 1115  ceFEPIme  (MAXIPIME ) 2 g in sodium chloride  0.9 % 100 mL IVPB        2 g 200 mL/hr over 30 Minutes Intravenous  Once 02/10/24 1028 02/10/24 1239        MEDICATIONS: Scheduled Meds:  amLODipine   5 mg Oral Daily   vitamin C   1,000 mg Oral Daily   atorvastatin   80 mg Oral Daily   bethanechol   10 mg Oral BID   Chlorhexidine  Gluconate Cloth  6 each Topical Q0600   darbepoetin (ARANESP ) injection - DIALYSIS  100 mcg Subcutaneous Q Sat-1800   heparin  injection (subcutaneous)  5,000 Units Subcutaneous Q8H   lactulose   30 g Oral Daily   leptospermum manuka honey  1 Application Topical Daily   levothyroxine   50 mcg Oral QAC breakfast   naloxegol  oxalate  12.5 mg Oral Daily   nutrition supplement (JUVEN)  1 packet Oral BID BM   oxyCODONE   30 mg Oral Q8H   polyethylene glycol  17 g Oral BID  senna-docusate  4 tablet Oral BID   sevelamer  carbonate  1,600 mg Oral TID WC   sodium zirconium cyclosilicate   10 g Oral BID   tamsulosin   0.4 mg Oral Daily   zinc  sulfate (50mg  elemental zinc )  220 mg Oral Daily   Continuous Infusions:  sodium chloride  10 mL/hr at 02/17/24 1600   ceFEPime  (MAXIPIME ) IV Stopped (02/20/24 1323)   promethazine  (PHENERGAN ) injection (IM or IVPB)     vancomycin  Stopped (02/20/24 1246)   PRN Meds:.acetaminophen  **OR** acetaminophen , bisacodyl , HYDROmorphone  (DILAUDID ) injection, melatonin, naLOXone  (NARCAN )  injection, ondansetron  (ZOFRAN ) IV, oxyCODONE , promethazine  (PHENERGAN ) injection (IM or IVPB), tiZANidine    I have personally reviewed following labs  and imaging studies  LABORATORY DATA: CBC: Recent Labs  Lab 02/16/24 0406 02/18/24 0504 02/20/24 0446 02/22/24 0419  WBC 5.4 6.7 6.4 5.7  HGB 10.2* 9.6* 8.5* 8.8*  HCT 31.1* 29.9* 26.6* 27.4*  MCV 104.0* 105.7* 105.6* 107.5*  PLT 155 151 151 165    Basic Metabolic Panel: Recent Labs  Lab 02/16/24 0406 02/18/24 0504 02/20/24 0446 02/22/24 0419  NA 136 139 137 139  K 5.9* 5.1 5.9* 5.5*  CL 100 102 101 101  CO2 23 20* 24 24  GLUCOSE 87 91 84 113*  BUN 62* 63* 52* 63*  CREATININE 8.09* 7.75* 7.10* 7.60*  CALCIUM  8.6* 8.5* 8.4* 8.9  PHOS 7.3* 6.9* 7.8* 8.4*    GFR: Estimated Creatinine Clearance: 11.4 mL/min (A) (by C-G formula based on SCr of 7.6 mg/dL (H)).  Liver Function Tests: Recent Labs  Lab 02/16/24 0406 02/18/24 0504 02/20/24 0446 02/22/24 0419  ALBUMIN  3.2* 3.4* 3.2* 3.0*   No results for input(s): "LIPASE", "AMYLASE" in the last 168 hours. No results for input(s): "AMMONIA" in the last 168 hours.  Urine analysis:    Component Value Date/Time   COLORURINE YELLOW 01/14/2022 0248   APPEARANCEUR HAZY (A) 01/14/2022 0248   LABSPEC 1.009 01/14/2022 0248   PHURINE 7.0 01/14/2022 0248   GLUCOSEU NEGATIVE 01/14/2022 0248   HGBUR MODERATE (A) 01/14/2022 0248   BILIRUBINUR NEGATIVE 01/14/2022 0248   KETONESUR NEGATIVE 01/14/2022 0248   PROTEINUR 100 (A) 01/14/2022 0248   NITRITE NEGATIVE 01/14/2022 0248   LEUKOCYTESUR LARGE (A) 01/14/2022 0248    Sepsis Labs: Lactic Acid, Venous    Component Value Date/Time   LATICACIDVEN 1.0 12/31/2021 0048    MICROBIOLOGY: No results found for this or any previous visit (from the past 240 hours).   RADIOLOGY STUDIES/RESULTS: No results found.     LOS: 14 days   Signature  -    Lynnwood Sauer M.D on 02/22/2024 at 9:23 AM   -  To page go to [email or web address]

## 2024-02-22 NOTE — Plan of Care (Signed)

## 2024-02-22 NOTE — Progress Notes (Addendum)
 [facility] Progress Note   Subjective:    Seen and examined patient at bedside. He reports ongoing cramping within his upper extremities. He reports the cramping was happening during outpatient dialysis 2-2.5hrs into treatments. He denies SOB, CP, and N/V. Noted current K+ is 5.5 and Lokelma  X 2 doses already ordered. Next HD 5/6.  Objective Vitals:   02/21/24 2005 02/21/24 2242 02/22/24 0400 02/22/24 0440  BP: 103/71 125/78 116/70   Pulse: 83 64 73   Resp: 15 15 13    Temp: 98.2 F (36.8 C)     TempSrc: Oral     SpO2: 99%  98%   Weight:    92 kg  Height:       Physical Exam General: Elderly male; chronically-ill appearing; NAD; on RA Heart: S1 and S2; No murmurs, gallops, or rubs Lungs: Clear anteriorly Abdomen: Soft and non-tender Extremities: R BKA; + L TMA wrapped Dialysis Access: [facility]   Filed Weights   02/20/24 1345 02/21/24 0401 02/22/24 0440  Weight: 90.2 kg 91.8 kg 92 kg    Intake/Output Summary (Last 24 hours) at 02/22/2024 1140 Last data filed at 02/22/2024 0916 Gross per 24 hour  Intake --  Output 250 ml  Net -250 ml    Additional Objective Labs: Basic Metabolic Panel: Recent Labs  Lab 02/18/24 0504 02/20/24 0446 02/22/24 0419  NA 139 137 139  K 5.1 5.9* 5.5*  CL 102 101 101  CO2 20* 24 24  GLUCOSE 91 84 113*  BUN 63* 52* 63*  CREATININE 7.75* 7.10* 7.60*  CALCIUM  8.5* 8.4* 8.9  PHOS 6.9* 7.8* 8.4*   Liver Function Tests: Recent Labs  Lab 02/18/24 0504 02/20/24 0446 02/22/24 0419  ALBUMIN  3.4* 3.2* 3.0*   No results for input(s): "LIPASE", "AMYLASE" in the last 168 hours. CBC: Recent Labs  Lab 02/16/24 0406 02/18/24 0504 02/20/24 0446 02/22/24 0419  WBC 5.4 6.7 6.4 5.7  HGB 10.2* 9.6* 8.5* 8.8*  HCT 31.1* 29.9* 26.6* 27.4*  MCV 104.0* 105.7* 105.6* 107.5*  PLT 155 151 151 165   Blood Culture    Component Value Date/Time   SDES BLOOD BLOOD RIGHT HAND AEROBIC BOTTLE ONLY 02/09/2024 0414   SPECREQUEST  02/09/2024  0414    BOTTLES DRAWN AEROBIC ONLY Blood Culture adequate volume   CULT  02/09/2024 0414    NO GROWTH 5 DAYS Performed at [facility], [address] [place], [place] [address]    REPTSTATUS 02/14/2024 FINAL 02/09/2024 0414    Cardiac Enzymes: No results for input(s): "CKTOTAL", "CKMB", "CKMBINDEX", "TROPONINI" in the last 168 hours. CBG: Recent Labs  Lab 02/21/24 1113 02/21/24 1644 02/22/24 0052 02/22/24 0747 02/22/24 1117  GLUCAP 132* 91 148* 86 110*   Iron  Studies: No results for input(s): "IRON ", "TIBC", "TRANSFERRIN", "FERRITIN" in the last 72 hours. Lab Results  Component Value Date   INR 1.1 02/09/2024   INR 1.1 02/08/2024   INR 1.3 (H) 01/06/2024   Studies/Results: No results found.  Medications:  sodium chloride  10 mL/hr at 02/17/24 1600   ceFEPime  (MAXIPIME ) IV Stopped (02/20/24 1323)   promethazine  (PHENERGAN ) injection (IM or IVPB)     vancomycin  Stopped (02/20/24 1246)    amLODipine   5 mg Oral Daily   vitamin C   1,000 mg Oral Daily   atorvastatin   80 mg Oral Daily   bethanechol   10 mg Oral BID   Chlorhexidine  Gluconate Cloth  6 each Topical Q0600   darbepoetin (ARANESP ) injection - DIALYSIS  100 mcg  Subcutaneous Q Sat-1800   heparin  injection (subcutaneous)  5,000 Units Subcutaneous Q8H   lactulose   30 g Oral Daily   leptospermum manuka honey  1 Application Topical Daily   levothyroxine   50 mcg Oral QAC breakfast   naloxegol  oxalate  12.5 mg Oral Daily   nutrition supplement (JUVEN)  1 packet Oral BID BM   oxyCODONE   30 mg Oral Q8H   polyethylene glycol  17 g Oral BID   senna-docusate  4 tablet Oral BID   sevelamer  carbonate  1,600 mg Oral TID WC   sodium zirconium cyclosilicate   10 g Oral BID   tamsulosin   0.4 mg Oral Daily   zinc  sulfate (50mg  elemental zinc )  220 mg Oral Daily    Dialysis Orders: TTS [place] 4h  B400  92kg  TDC  Heparin  none - Mircera 225mcg IV q 2 weeks (last 4/9) - Calcitriol  0.71mcg PO q  HD  Assessment/Plan: Thoracic discitis/OM: recent acute on chronic back pain, s/p fall on 4/20. CT imaging showed T9-10 discitis. He refused MRI, area was too small for aspiration per IR. Earlier this year he completed an 8 week course of Dapto/Cefepime  on 3/20 for bacteremia presumed due to foot infection. Concerned that this infection may have seeded the spine. Blood cx's were negative. Per ortho pt had L TMA on 4/30. Per ID needs IV Vanc/Cefepime  x 6 weeks thru 03/23/24 for the discitis.  ESRD:  Usual TTS schedule. Next HD 5/6. Current K+ is 5.5 and Lokelma  X 2 already ordered. He's been c/o ongoing cramping. Per patient, the cramping occurs 2-2.5hrs into treatment even in outpatient. He's not overloaded on exam today. Will use minimal UF with HD tomorrow. Will consider running even if needed. HTN/volume: bp's okay, 1.6 L UF yest w/ HD (goal 2.5). At dry wt.  Anemia: HGB 10.2. Follow labs. On weekly ESA. No venofer in setting of infection. Metabolic bone disease: Ca ok, Phos high - resumed home binders. Nutrition: Alb 3.4, follow. 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  Jadene Maxwell, NP [facility] 02/22/2024,11:40 AM  LOS: 14 days

## 2024-02-22 NOTE — Progress Notes (Signed)
 Physical Therapy Treatment Patient Details Name: Thomas [name] MRN: [id number] DOB: 1951-09-27 Today's Date: 02/22/2024   History of Present Illness The pt is a 73 yo male presenting 4/20 with back pain after a fall at home. 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 PMH includes: ESRD on HD TTS, prior thoracic spine fusion, spinal 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-T12, chronic thoracic osteomyelitis, chronic pain syndrome, gout, PE not on anticoagulation, DM-2, right BKA, left second toe ulcer and HTN    PT Comments  Self limiting. Refuses to progress mobility to edge of bed despite pre-arranged meeting with pt after scheduled pain meds. Extensive education on risks associated with prolonged immobility. Dangled LEs off bed but refused to allow therapist to assist with transition to sit EOB. Educated that it unlikely pain can be completely eliminated and will need to find a balance between pain control and mobility to preserve quality of life. He agreed to perform LE exercises in bed. Patient will continue to benefit from skilled physical therapy services to further improve independence with functional mobility.     If plan is discharge home, recommend the following: Two people to help with walking and/or transfers;Two people to help with bathing/dressing/bathroom;Assistance with cooking/housework;Assistance with feeding;Direct supervision/assist for medications management;Direct supervision/assist for financial management;Assist for transportation;Help with stairs or ramp for entrance   Can travel by private vehicle     No  Equipment Recommendations  Hospital bed (States w/c will not fit in home)    Recommendations for Other Services       Precautions / Restrictions Precautions Precautions: Back;Fall Recall of Precautions/Restrictions: Intact Precaution/Restrictions Comments:  (educated on spinal precautions for comfort, no formal back precautions  ordered. WB precautions TDWB Lt foot) Required Braces or Orthoses: Spinal Brace;Other Brace Spinal Brace: Thoracolumbosacral orthotic;Applied in sitting position Other Brace: Post op shoe Restrictions Weight Bearing Restrictions Per Provider Order: Yes LLE Weight Bearing Per Provider Order: Touchdown weight bearing Other Position/Activity Restrictions: Post op shoe     Mobility  Bed Mobility Overal bed mobility: Needs Assistance Bed Mobility: Supine to Sit           General bed mobility comments: Initially agreeable. Able to bring bil LEs to dangle over bed with verbal cues only. Refused to attempt to sit up EOB.    Transfers                   General transfer comment: refused    Ambulation/Gait                   Stairs             Wheelchair Mobility     Tilt Bed    Modified Rankin (Stroke Patients Only)       Balance Overall balance assessment: History of Falls (unwilling to sit EOB at this time 2/2 pain despite being premedicated.)                                          Communication Communication Communication: No apparent difficulties  Cognition Arousal: Alert Behavior During Therapy: WFL for tasks assessed/performed   PT - Cognitive impairments: No family/caregiver present to determine baseline                       PT - Cognition Comments: Self limiting. Educated on risks of immobility. Following  commands: Intact      Cueing Cueing Techniques: Verbal cues  Exercises General Exercises - Lower Extremity Ankle Circles/Pumps: AAROM, Left, 10 reps, Supine Quad Sets: Both, 10 reps, Strengthening, Supine Hip ABduction/ADduction: AROM, Both, 10 reps Straight Leg Raises: Strengthening, Both, 5 reps, Supine    General Comments General comments (skin integrity, edema, etc.): vss      Pertinent Vitals/Pain Pain Assessment Pain Assessment: Faces Pain Score: 8  Pain Location: back Pain Descriptors  / Indicators: Aching Pain Intervention(s): Limited activity within patient's tolerance, Monitored during session, Premedicated before session, Repositioned    Home Living                          Prior Function            PT Goals (current goals can now be found in the care plan section) Acute Rehab PT Goals Patient Stated Goal: to reduce pain PT Goal Formulation: With patient Time For Goal Achievement: 03/03/24 Potential to Achieve Goals: Fair Progress towards PT goals: Not progressing toward goals - comment    Frequency    Min 2X/week      PT Plan      Co-evaluation              AM-PAC PT "6 Clicks" Mobility   Outcome Measure  Help needed turning from your back to your side while in a flat bed without using bedrails?: A Lot Help needed moving from lying on your back to sitting on the side of a flat bed without using bedrails?: Total Help needed moving to and from a bed to a chair (including a wheelchair)?: Total Help needed standing up from a chair using your arms (e.g., wheelchair or bedside chair)?: Total Help needed to walk in hospital room?: Total Help needed climbing 3-5 steps with a railing? : Total 6 Click Score: 7    End of Session   Activity Tolerance: Other (comment) (Self limiting behaviors) Patient left: in bed;with call bell/phone within reach;with bed alarm set Nurse Communication: Mobility status PT Visit Diagnosis: Unsteadiness on feet (R26.81);Other abnormalities of gait and mobility (R26.89);Repeated falls (R29.6);Muscle weakness (generalized) (M62.81);Pain;Difficulty in walking, not elsewhere classified (R26.2);History of falling (Z91.81) Pain - part of body:  (back)     Time: 1610-9604 PT Time Calculation (min) (ACUTE ONLY): 14 min  Charges:    $Therapeutic Exercise: 8-22 mins PT General Charges $$ ACUTE PT VISIT: 1 Visit                     Jory Ng, PT, DPT [facility]  Rehabilitation Services Physical  Therapist Office: [phone] Website: [email or web address]    [name] 02/22/2024, 2:43 PM

## 2024-02-22 NOTE — Progress Notes (Signed)
 PHARMACY CONSULT NOTE FOR:  OUTPATIENT  PARENTERAL ANTIBIOTIC THERAPY (OPAT)  Informational as the patient will receive antibiotics with hemodialysis outpatient  Indication: Thoracic discitis Regimen: Vancomycin  750 mg /HD-TTS + Cefepime  2g/HD-TTS End date: 03/23/24  IV antibiotic discharge orders are pended. To discharging provider:  please sign these orders via discharge navigator,  Select New Orders & click on the button choice - Manage This Unsigned Work.    Thank you for allowing pharmacy to be a part of this patient's care.  Denson Flake, PharmD, BCPS, BCIDP Infectious Diseases Clinical Pharmacist Phone: [phone] 02/22/2024 2:26 PM   **Pharmacist phone directory can now be found on [email or web address] ([email or web address]).  Listed under [facility].

## 2024-02-23 DIAGNOSIS — M4624 Osteomyelitis of vertebra, thoracic region: Secondary | ICD-10-CM | POA: Diagnosis not present

## 2024-02-23 LAB — GLUCOSE, CAPILLARY
Glucose-Capillary: 87 mg/dL (ref 70–99)
Glucose-Capillary: 99 mg/dL (ref 70–99)

## 2024-02-23 LAB — CBC WITH DIFFERENTIAL/PLATELET
Abs Immature Granulocytes: 0.02 10*3/uL (ref 0.00–0.07)
Basophils Absolute: 0 10*3/uL (ref 0.0–0.1)
Basophils Relative: 1 %
Eosinophils Absolute: 0.3 10*3/uL (ref 0.0–0.5)
Eosinophils Relative: 4 %
HCT: 29.8 % — ABNORMAL LOW (ref 39.0–52.0)
Hemoglobin: 9.5 g/dL — ABNORMAL LOW (ref 13.0–17.0)
Immature Granulocytes: 0 %
Lymphocytes Relative: 17 %
Lymphs Abs: 1.2 10*3/uL (ref 0.7–4.0)
MCH: 33.8 pg (ref 26.0–34.0)
MCHC: 31.9 g/dL (ref 30.0–36.0)
MCV: 106 fL — ABNORMAL HIGH (ref 80.0–100.0)
Monocytes Absolute: 0.6 10*3/uL (ref 0.1–1.0)
Monocytes Relative: 8 %
Neutro Abs: 5 10*3/uL (ref 1.7–7.7)
Neutrophils Relative %: 70 %
Platelets: 173 10*3/uL (ref 150–400)
RBC: 2.81 MIL/uL — ABNORMAL LOW (ref 4.22–5.81)
RDW: 14.2 % (ref 11.5–15.5)
WBC: 7.1 10*3/uL (ref 4.0–10.5)
nRBC: 0 % (ref 0.0–0.2)

## 2024-02-23 LAB — RENAL FUNCTION PANEL
Albumin: 3.3 g/dL — ABNORMAL LOW (ref 3.5–5.0)
Anion gap: 19 — ABNORMAL HIGH (ref 5–15)
BUN: 75 mg/dL — ABNORMAL HIGH (ref 8–23)
CO2: 20 mmol/L — ABNORMAL LOW (ref 22–32)
Calcium: 8.7 mg/dL — ABNORMAL LOW (ref 8.9–10.3)
Chloride: 100 mmol/L (ref 98–111)
Creatinine, Ser: 9.22 mg/dL — ABNORMAL HIGH (ref 0.61–1.24)
GFR, Estimated: 6 mL/min — ABNORMAL LOW (ref >60)
Glucose, Bld: 95 mg/dL (ref 70–99)
Phosphorus: 10.1 mg/dL — ABNORMAL HIGH (ref 2.5–4.6)
Potassium: 6.2 mmol/L — ABNORMAL HIGH (ref 3.5–5.1)
Sodium: 139 mmol/L (ref 135–145)

## 2024-02-23 MED ORDER — ALTEPLASE 2 MG IJ SOLR: 2.0000 mg | Freq: Once | INTRAMUSCULAR | Status: DC | PRN

## 2024-02-23 MED ORDER — PENTAFLUOROPROP-TETRAFLUOROETH EX AERO: 1.0000 | INHALATION_SPRAY | CUTANEOUS | Status: DC | PRN

## 2024-02-23 MED ORDER — HEPARIN SODIUM (PORCINE) 1000 UNIT/ML IJ SOLN
INTRAMUSCULAR | Status: AC
Start: 2024-02-23 — End: ?
  Filled 2024-02-23: qty 4

## 2024-02-23 MED ORDER — VANCOMYCIN HCL 750 MG/150ML IV SOLN
INTRAVENOUS | Status: AC
  Filled 2024-02-23: qty 150

## 2024-02-23 MED ORDER — LIDOCAINE HCL (PF) 1 % IJ SOLN: 5.0000 mL | INTRAMUSCULAR | Status: DC | PRN

## 2024-02-23 MED ORDER — LIDOCAINE-PRILOCAINE 2.5-2.5 % EX CREA: 1.0000 | TOPICAL_CREAM | CUTANEOUS | Status: DC | PRN

## 2024-02-23 MED ORDER — MIDODRINE HCL 5 MG PO TABS
10.0000 mg | ORAL_TABLET | ORAL | Status: AC
  Administered 2024-02-23: 10 mg via ORAL
  Filled 2024-02-23: qty 2

## 2024-02-23 MED ORDER — HYDROMORPHONE HCL 1 MG/ML IJ SOLN
INTRAMUSCULAR | Status: AC
Start: 2024-02-23 — End: ?
  Filled 2024-02-23: qty 0.5

## 2024-02-23 MED ORDER — HEPARIN SODIUM (PORCINE) 1000 UNIT/ML DIALYSIS
1000.0000 [IU] | INTRAMUSCULAR | Status: DC | PRN
  Administered 2024-02-23: 3200 [IU] via INTRAVENOUS_CENTRAL
  Filled 2024-02-23 (×2): qty 1

## 2024-02-23 NOTE — Progress Notes (Signed)
   02/23/24 1330  Vitals  Temp 98.7 F (37.1 C)  Pulse Rate 93  Resp 17  BP (!) 141/79  SpO2 99 %  O2 Device Room Air  Weight 91.3 kg  Type of Weight Post-Dialysis  Oxygen Therapy  Patient Activity (if Appropriate) In bed  Pulse Oximetry Type Continuous  Oximetry Probe Site Changed No  Post Treatment  Dialyzer Clearance Lightly streaked  Hemodialysis Intake (mL) 0 mL  Liters Processed 68.6  Fluid Removed (mL) 0 mL  Tolerated HD Treatment Yes   Received patient in bed to unit.  Alert and oriented.  Informed consent signed and in chart.   TX duration:3.5  Patient tolerated well.  Transported back to the room  Alert, without acute distress.  Hand-off given to patient's nurse.   Access used: [email or web address] Access issues: no complications  Total UF removed: ran even per md order Medication(s) given: dilaudid  iv x 1  [facility]

## 2024-02-23 NOTE — Progress Notes (Signed)
 PROGRESS NOTE        PATIENT DETAILS Name: Thomas [name] Age: 73 y.o. Sex: male Date of Birth: 1951/03/02 Admit Date: 02/07/2024 Admitting Physician Roxana Copier, DO ZOX:WRUEAV, [name], MD  Brief Summary: Patient is a 73 y.o.  male who with history of ESRD on HD TTS, history of posterior fusion at T10-12 due to osteomyelitis/discitis/epidural abscess in 2021, history of right BKA who recently completed 6 weeks of daptomycin /cefepime  on 3/28 for left foot infection with osteomyelitis-presented with low back pain-upon further evaluation with CT imaging-he was found to have acute osteomyelitis involving his thoracic spine and admitted to the hospitalist service.  Significant events: 4/20>> admit to TRH-back pain-concern for thoracic osteomyelitis.  Significant studies: 4/21>> x-ray right elbow: No fracture. 4/21>> x-ray left wrist: No fracture 4/21>> x-ray left shoulder, no fracture. 4/21>> CT head: No acute intracranial abnormality 4/21>> CT C-spine: No fracture. 4/21>> CT abdomen/pelvis: No acute injury to chest/abdomen/pelvis 4/21>> CT T-spine: Prior posterior fusion from T10-T12-abnormal appearance with collapse of T10-lucency around T10 screws at the T9-10 disc space, T10-11 disc space concerning for discitis/osteomyelitis. 4/21>> CT L-spine: No acute bony abnormality, mild chronic compression fracture at L1. 4/22>> x-ray left foot: Chronic erosive changes about the forefoot similar to 11/21/2023-osteomyelitis/septic arthritis of the 1st/2nd MTP joints difficult to exclude. 4/25>> left ABI: Within normal limit.  Significant microbiology data: 4/22>> blood culture: No growth  Procedures: 4/30>> left transmetatarsal amputation by Dr. Julio Ohm.  Consults: Neurosurgery Infectious disease Ortho  Subjective:  Patient in bed, comfortably sleeping, woken up, in no discomfort,  denies any headache, no fever, no chest pain or pressure, no shortness of breath  , no abdominal pain. No new focal weakness.   Objective: Vitals: Blood pressure (!) 165/93, pulse 88, temperature 97.6 F (36.4 C), temperature source Oral, resp. rate 13, height 6\' 5"  (1.956 m), weight 91.3 kg, SpO2 99%.   Exam:  Awake/alert, in absolutely no distress Chest: Clear to auscultation CVS: S1-S2 regular Abdomen: Soft nontender nondistended Extremities: Left TMA dressing in place, s/p chronic right AKA.  ment/Plan:  Acute osteomyelitis with discitis involving T9 to T11 vertebra Per neurosurgery not an operative candidate Per IR-no window to aspirate Plan is for 6 weeks of vancomycin /cefepime  with hemodialysis-EOT 6/4 Blood cultures negative so far Back pain better controlled-he is back on his OxyContin  30 mg p.o. 3 times daily-reduce Dilaudid  to 0.5 mg for severe breakthrough pain-continue oxycodone  for moderate breakthrough pain.  Continue to wear TLSO brace when out of the bed. Note patient is on high doses of narcotics chronically, exhibits some narcotic seeking behavior, counseled to avoid excessive use to avoid accidental overdose, as needed Narcan  on board.  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 Chronic left second toe ulceration He just completed 6 weeks of daptomycin /cefepime  on 3/28-has refused amputation in the past.  Concern is that he may have seeded his spine through foot infection.  Subsequently evaluated by Dr. Julio Ohm on 4/28-underwent TMA on 4/30.  Toe-touch weightbearing on that leg.  Transient hypotension on 4/30 Now stable on low-dose Norvasc ,-clonidine /losartan /metoprolol  remains on hold-continue to monitor and add as appropriate.  Constipation Chronic issue-secondary to narcotics Finally had a BM after several days-remains on MiraLAX /senna-Movantik  added on 5/2. Dulcolax suppository as needed.  Mechanical fall Numerous imaging studies negative for fracture PT/OT eval  ESRD on HD TTS Nephrology following and directing HD  care  Hyperkalemia on 02/20/2024 He underwent HD 02/20/2024 afternoon, 2 doses of Lokelma  on 02/22/2024, defer to nephrology  Prolonged QTc Twelve-lead EKG on 4/30 with normal QTc Telemetry monitoring  Normocytic anemia Secondary to ESRD Aranesp /iron  deferred to nephrology service.  Chronic HFpEF Euvolemic on exam Fluid removal with HD  HTN See above regarding hypotension BP relatively stable-see above discussion.  HLD Statin  Hypothyroidism Synthroid   BPH Flomax /bethanechol   History of right BKA  Debility/deconditioning Very reluctant to go to SNF-difficult situation-he has s/p right AKA-now left TMA several days ago-with touchdown weightbearing status.  Lives alone-minimal family assistance-his brother helps him some.  After extensive discussion-he seems a bit more accepting of the fact that he may have to go to SNF short-term.  BMI: Estimated body mass index is 23.87 kg/m as calculated from the following:   Height as of this encounter: 6\' 5"  (1.956 m).   Weight as of this encounter: 91.3 kg.   Code status:   Code Status: Full Code   DVT Prophylaxis: SCD's Start: 02/17/24 1538 heparin  injection 5,000 Units Start: 02/10/24 1400 SCDs Start: 02/08/24 2206   Family Communication: Brother-[phone] updated 5/1   Disposition Plan: Status is: Inpatient Remains inpatient appropriate because: Severity of illness   Planned Discharge Destination: SNF   Diet: Diet Order             Diet renal with fluid restriction Fluid restriction: 1200 mL Fluid; Room service appropriate? Yes; Fluid consistency: Thin  Diet effective now                     Antimicrobial agents: Anti-infectives (From admission, onward)    Start     Dose/Rate Route Frequency Ordered Stop   02/23/24 1800  vancomycin  (VANCOREADY) IVPB 750 mg/150 mL        750 mg 150 mL/hr over 60 Minutes Intravenous Every T-Th-Sa (Hemodialysis) 02/22/24 1432     02/23/24 0000  vancomycin  IVPB         750 mg Intravenous Every T-Th-Sa (Hemodialysis) 02/22/24 1447 03/23/24 2359   02/11/24 1800  ceFEPIme  (MAXIPIME ) 2 g in sodium chloride  0.9 % 100 mL IVPB        2 g 200 mL/hr over 30 Minutes Intravenous Every T-Th-Sa (Hemodialysis) 02/10/24 1028     02/11/24 1200  vancomycin  (VANCOCIN ) IVPB 1000 mg/200 mL premix  Status:  Discontinued        1,000 mg 200 mL/hr over 60 Minutes Intravenous Every T-Th-Sa (Hemodialysis) 02/10/24 1028 02/22/24 1432   02/11/24 0000  ceFEPime  (MAXIPIME ) IVPB        2 g Intravenous Every T-Th-Sa (Hemodialysis) 02/11/24 0747 03/23/24 2359   02/11/24 0000  vancomycin  IVPB  Status:  Discontinued        1,000 mg Intravenous Every T-Th-Sa (Hemodialysis) 02/11/24 0747 02/22/24    02/10/24 1115  vancomycin  (VANCOREADY) IVPB 2000 mg/400 mL        2,000 mg 200 mL/hr over 120 Minutes Intravenous  Once 02/10/24 1028 02/10/24 1446   02/10/24 1115  ceFEPIme  (MAXIPIME ) 2 g in sodium chloride  0.9 % 100 mL IVPB        2 g 200 mL/hr over 30 Minutes Intravenous  Once 02/10/24 1028 02/10/24 1239        MEDICATIONS: Scheduled Meds:  amLODipine   5 mg Oral Daily   vitamin C   1,000 mg Oral Daily   atorvastatin   80 mg Oral Daily   bethanechol   10 mg Oral BID   Chlorhexidine  Gluconate Cloth  6 each Topical Q0600   darbepoetin (ARANESP ) injection - DIALYSIS  100 mcg Subcutaneous Q Sat-1800   heparin  injection (subcutaneous)  5,000 Units Subcutaneous Q8H   lactulose   30 g Oral Daily   leptospermum manuka honey  1 Application Topical Daily   levothyroxine   50 mcg Oral QAC breakfast   naloxegol  oxalate  12.5 mg Oral Daily   nutrition supplement (JUVEN)  1 packet Oral BID BM   oxyCODONE   30 mg Oral Q8H   polyethylene glycol  17 g Oral BID   senna-docusate  4 tablet Oral BID   sevelamer  carbonate  1,600 mg Oral TID WC   tamsulosin   0.4 mg Oral Daily   zinc  sulfate (50mg  elemental zinc )  220 mg Oral Daily   Continuous Infusions:  sodium chloride  10 mL/hr at 02/17/24 1600    ceFEPime  (MAXIPIME ) IV Stopped (02/20/24 1323)   promethazine  (PHENERGAN ) injection (IM or IVPB)     vancomycin      PRN Meds:.acetaminophen  **OR** acetaminophen , alteplase , bisacodyl , heparin , HYDROmorphone  (DILAUDID ) injection, lidocaine  (PF), lidocaine -prilocaine , melatonin, naLOXone  (NARCAN )  injection, ondansetron  (ZOFRAN ) IV, oxyCODONE , pentafluoroprop-tetrafluoroeth, promethazine  (PHENERGAN ) injection (IM or IVPB), tiZANidine    I have personally reviewed following labs and imaging studies  LABORATORY DATA: CBC: Recent Labs  Lab 02/18/24 0504 02/20/24 0446 02/22/24 0419  WBC 6.7 6.4 5.7  HGB 9.6* 8.5* 8.8*  HCT 29.9* 26.6* 27.4*  MCV 105.7* 105.6* 107.5*  PLT 151 151 165    Basic Metabolic Panel: Recent Labs  Lab 02/18/24 0504 02/20/24 0446 02/22/24 0419  NA 139 137 139  K 5.1 5.9* 5.5*  CL 102 101 101  CO2 20* 24 24  GLUCOSE 91 84 113*  BUN 63* 52* 63*  CREATININE 7.75* 7.10* 7.60*  CALCIUM  8.5* 8.4* 8.9  PHOS 6.9* 7.8* 8.4*    GFR: Estimated Creatinine Clearance: 11.4 mL/min (A) (by C-G formula based on SCr of 7.6 mg/dL (H)).  Liver Function Tests: Recent Labs  Lab 02/18/24 0504 02/20/24 0446 02/22/24 0419  ALBUMIN  3.4* 3.2* 3.0*   No results for input(s): "LIPASE", "AMYLASE" in the last 168 hours. No results for input(s): "AMMONIA" in the last 168 hours.  Urine analysis:    Component Value Date/Time   COLORURINE YELLOW 01/14/2022 0248   APPEARANCEUR HAZY (A) 01/14/2022 0248   LABSPEC 1.009 01/14/2022 0248   PHURINE 7.0 01/14/2022 0248   GLUCOSEU NEGATIVE 01/14/2022 0248   HGBUR MODERATE (A) 01/14/2022 0248   BILIRUBINUR NEGATIVE 01/14/2022 0248   KETONESUR NEGATIVE 01/14/2022 0248   PROTEINUR 100 (A) 01/14/2022 0248   NITRITE NEGATIVE 01/14/2022 0248   LEUKOCYTESUR LARGE (A) 01/14/2022 0248    Sepsis Labs: Lactic Acid, Venous    Component Value Date/Time   LATICACIDVEN 1.0 12/31/2021 0048    MICROBIOLOGY: No results found for this  or any previous visit (from the past 240 hours).   RADIOLOGY STUDIES/RESULTS: No results found.     LOS: 15 days   Signature  -    Lynnwood Sauer M.D on 02/23/2024 at 8:28 AM   -  To page go to [email or web address]

## 2024-02-23 NOTE — Progress Notes (Signed)
 [facility] Progress Note   Subjective:    Seen and examined patient at bedside prior to initiation of dialysis.  Still has hand cramps, last HD 72h ago. He denies SOB, CP, and N/V  Objective Vitals:   02/23/24 0009 02/23/24 0324 02/23/24 0500 02/23/24 0815  BP: 116/84   (!) 165/93  Pulse:    88  Resp:    13  Temp: 97.8 F (36.6 C) (!) 97.4 F (36.3 C)  97.6 F (36.4 C)  TempSrc: Oral Oral  Oral  SpO2:    99%  Weight:   91.3 kg   Height:       Physical Exam General: Elderly male; chronically-ill appearing; NAD; on RA Heart: S1 and S2; No murmurs, gallops, or rubs Lungs: Clear anteriorly Abdomen: Soft and non-tender Extremities: R BKA; + L TMA wrapped Dialysis Access: [facility]   Filed Weights   02/21/24 0401 02/22/24 0440 02/23/24 0500  Weight: 91.8 kg 92 kg 91.3 kg    Intake/Output Summary (Last 24 hours) at 02/23/2024 0907 Last data filed at 02/23/2024 0600 Gross per 24 hour  Intake 399 ml  Output 500 ml  Net -101 ml    Additional Objective Labs: Basic Metabolic Panel: Recent Labs  Lab 02/18/24 0504 02/20/24 0446 02/22/24 0419  NA 139 137 139  K 5.1 5.9* 5.5*  CL 102 101 101  CO2 20* 24 24  GLUCOSE 91 84 113*  BUN 63* 52* 63*  CREATININE 7.75* 7.10* 7.60*  CALCIUM  8.5* 8.4* 8.9  PHOS 6.9* 7.8* 8.4*   Liver Function Tests: Recent Labs  Lab 02/18/24 0504 02/20/24 0446 02/22/24 0419  ALBUMIN  3.4* 3.2* 3.0*   No results for input(s): "LIPASE", "AMYLASE" in the last 168 hours. CBC: Recent Labs  Lab 02/18/24 0504 02/20/24 0446 02/22/24 0419 02/23/24 0813  WBC 6.7 6.4 5.7 7.1  NEUTROABS  --   --   --  5.0  HGB 9.6* 8.5* 8.8* 9.5*  HCT 29.9* 26.6* 27.4* 29.8*  MCV 105.7* 105.6* 107.5* 106.0*  PLT 151 151 165 173   Blood Culture    Component Value Date/Time   SDES BLOOD BLOOD RIGHT HAND AEROBIC BOTTLE ONLY 02/09/2024 0414   SPECREQUEST  02/09/2024 0414    BOTTLES DRAWN AEROBIC ONLY Blood Culture adequate volume   CULT   02/09/2024 0414    NO GROWTH 5 DAYS Performed at [facility], [address], [place], [place] [address]    REPTSTATUS 02/14/2024 FINAL 02/09/2024 0414    Cardiac Enzymes: No results for input(s): "CKTOTAL", "CKMB", "CKMBINDEX", "TROPONINI" in the last 168 hours. CBG: Recent Labs  Lab 02/22/24 0747 02/22/24 1117 02/22/24 1515 02/22/24 2248 02/23/24 0815  GLUCAP 86 110* 105* 111* 87   Iron  Studies: No results for input(s): "IRON ", "TIBC", "TRANSFERRIN", "FERRITIN" in the last 72 hours. Lab Results  Component Value Date   INR 1.1 02/09/2024   INR 1.1 02/08/2024   INR 1.3 (H) 01/06/2024   Studies/Results: No results found.  Medications:  sodium chloride  10 mL/hr at 02/17/24 1600   ceFEPime  (MAXIPIME ) IV Stopped (02/20/24 1323)   promethazine  (PHENERGAN ) injection (IM or IVPB)     vancomycin       amLODipine   5 mg Oral Daily   vitamin C   1,000 mg Oral Daily   atorvastatin   80 mg Oral Daily   bethanechol   10 mg Oral BID   Chlorhexidine  Gluconate Cloth  6 each Topical Q0600   darbepoetin (ARANESP ) injection - DIALYSIS  100 mcg Subcutaneous  Q Sat-1800   heparin  injection (subcutaneous)  5,000 Units Subcutaneous Q8H   lactulose   30 g Oral Daily   leptospermum manuka honey  1 Application Topical Daily   levothyroxine   50 mcg Oral QAC breakfast   naloxegol  oxalate  12.5 mg Oral Daily   nutrition supplement (JUVEN)  1 packet Oral BID BM   oxyCODONE   30 mg Oral Q8H   polyethylene glycol  17 g Oral BID   senna-docusate  4 tablet Oral BID   sevelamer  carbonate  1,600 mg Oral TID WC   tamsulosin   0.4 mg Oral Daily   zinc  sulfate (50mg  elemental zinc )  220 mg Oral Daily    Dialysis Orders: TTS [place] 4h  B400  92kg  TDC  Heparin  none - Mircera 225mcg IV q 2 weeks (last 4/9) - Calcitriol  0.25mcg PO q HD  Assessment/Plan: Thoracic discitis/OM: recent acute on chronic back pain, s/p fall on 4/20. CT imaging showed T9-10 discitis. He refused MRI, area was too  small for aspiration per IR. Earlier this year he completed an 8 week course of Dapto/Cefepime  on 3/20 for bacteremia presumed due to foot infection. Concerned that this infection may have seeded the spine. Blood cx's were negative. Per ortho pt had L TMA on 4/30. Per ID needs IV Vanc/Cefepime  x 6 weeks thru 03/23/24 for the discitis.  ESRD:  Usual TTS schedule. Next HD today. HTN/volume: bp's okay, at EDW.  Has some residual UOP.  Minimal UF with HD today.   Anemia: HGB 8-9s. Follow labs. On weekly ESA. No venofer in setting of infection. Metabolic bone disease: Ca ok, Phos high - resumed home binders. Nutrition: Alb 3.4, follow. 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  Adrian Alba MD [facility] Pager [phone]

## 2024-02-23 NOTE — Plan of Care (Signed)
  Problem: Education: Goal: Ability to describe self-care measures that may prevent or decrease complications (Diabetes Survival Skills Education) will improve Outcome: Progressing Goal: Individualized Educational Video(s) Outcome: Progressing   Problem: Coping: Goal: Ability to adjust to condition or change in health will improve Outcome: Progressing   Problem: Fluid Volume: Goal: Ability to maintain a balanced intake and output will improve Outcome: Progressing   Problem: Health Behavior/Discharge Planning: Goal: Ability to identify and utilize available resources and services will improve Outcome: Progressing Goal: Ability to manage health-related needs will improve Outcome: Progressing   Problem: Metabolic: Goal: Ability to maintain appropriate glucose levels will improve Outcome: Progressing   Problem: Nutritional: Goal: Maintenance of adequate nutrition will improve Outcome: Progressing Goal: Progress toward achieving an optimal weight will improve Outcome: Progressing   Problem: Skin Integrity: Goal: Risk for impaired skin integrity will decrease Outcome: Progressing   Problem: Tissue Perfusion: Goal: Adequacy of tissue perfusion will improve Outcome: Progressing   Problem: Education: Goal: Knowledge of General Education information will improve Description: Including pain rating scale, medication(s)/side effects and non-pharmacologic comfort measures Outcome: Progressing   Problem: Health Behavior/Discharge Planning: Goal: Ability to manage health-related needs will improve Outcome: Progressing   Problem: Clinical Measurements: Goal: Ability to maintain clinical measurements within normal limits will improve Outcome: Progressing Goal: Will remain free from infection Outcome: Progressing Goal: Diagnostic test results will improve Outcome: Progressing Goal: Respiratory complications will improve Outcome: Progressing Goal: Cardiovascular complication will  be avoided Outcome: Progressing   Problem: Activity: Goal: Risk for activity intolerance will decrease Outcome: Progressing   Problem: Nutrition: Goal: Adequate nutrition will be maintained Outcome: Progressing   Problem: Coping: Goal: Level of anxiety will decrease Outcome: Progressing   Problem: Elimination: Goal: Will not experience complications related to bowel motility Outcome: Progressing Goal: Will not experience complications related to urinary retention Outcome: Progressing   Problem: Pain Managment: Goal: General experience of comfort will improve and/or be controlled Outcome: Progressing   Problem: Safety: Goal: Ability to remain free from injury will improve Outcome: Progressing   Problem: Skin Integrity: Goal: Risk for impaired skin integrity will decrease Outcome: Progressing   Problem: Education: Goal: Knowledge of the prescribed therapeutic regimen will improve Outcome: Progressing Goal: Ability to verbalize activity precautions or restrictions will improve Outcome: Progressing Goal: Understanding of discharge needs will improve Outcome: Progressing   Problem: Activity: Goal: Ability to perform//tolerate increased activity and mobilize with assistive devices will improve Outcome: Progressing   Problem: Clinical Measurements: Goal: Postoperative complications will be avoided or minimized Outcome: Progressing   Problem: Self-Care: Goal: Ability to meet self-care needs will improve Outcome: Progressing   Problem: Self-Concept: Goal: Ability to maintain and perform role responsibilities to the fullest extent possible will improve Outcome: Progressing   Problem: Pain Management: Goal: Pain level will decrease with appropriate interventions Outcome: Progressing   Problem: Skin Integrity: Goal: Demonstration of wound healing without infection will improve Outcome: Progressing

## 2024-02-24 DIAGNOSIS — M4624 Osteomyelitis of vertebra, thoracic region: Secondary | ICD-10-CM | POA: Diagnosis not present

## 2024-02-24 LAB — CBC
HCT: 27.4 % — ABNORMAL LOW (ref 39.0–52.0)
Hemoglobin: 8.9 g/dL — ABNORMAL LOW (ref 13.0–17.0)
MCH: 34.5 pg — ABNORMAL HIGH (ref 26.0–34.0)
MCHC: 32.5 g/dL (ref 30.0–36.0)
MCV: 106.2 fL — ABNORMAL HIGH (ref 80.0–100.0)
Platelets: 172 10*3/uL (ref 150–400)
RBC: 2.58 MIL/uL — ABNORMAL LOW (ref 4.22–5.81)
RDW: 14.6 % (ref 11.5–15.5)
WBC: 6.7 10*3/uL (ref 4.0–10.5)
nRBC: 0 % (ref 0.0–0.2)

## 2024-02-24 LAB — RENAL FUNCTION PANEL
Albumin: 3 g/dL — ABNORMAL LOW (ref 3.5–5.0)
Anion gap: 15 (ref 5–15)
BUN: 48 mg/dL — ABNORMAL HIGH (ref 8–23)
CO2: 25 mmol/L (ref 22–32)
Calcium: 8.8 mg/dL — ABNORMAL LOW (ref 8.9–10.3)
Chloride: 96 mmol/L — ABNORMAL LOW (ref 98–111)
Creatinine, Ser: 7.12 mg/dL — ABNORMAL HIGH (ref 0.61–1.24)
GFR, Estimated: 8 mL/min — ABNORMAL LOW (ref >60)
Glucose, Bld: 88 mg/dL (ref 70–99)
Phosphorus: 7.2 mg/dL — ABNORMAL HIGH (ref 2.5–4.6)
Potassium: 5.9 mmol/L — ABNORMAL HIGH (ref 3.5–5.1)
Sodium: 136 mmol/L (ref 135–145)

## 2024-02-24 LAB — GLUCOSE, CAPILLARY
Glucose-Capillary: 81 mg/dL (ref 70–99)
Glucose-Capillary: 88 mg/dL (ref 70–99)
Glucose-Capillary: 79 mg/dL (ref 70–99)
Glucose-Capillary: 86 mg/dL (ref 70–99)

## 2024-02-24 MED ORDER — SODIUM ZIRCONIUM CYCLOSILICATE 10 G PO PACK
10.0000 g | PACK | Freq: Two times a day (BID) | ORAL | Status: AC
  Administered 2024-02-24 (×2): 10 g via ORAL
  Filled 2024-02-24 (×2): qty 1

## 2024-02-24 MED ORDER — CHLORHEXIDINE GLUCONATE CLOTH 2 % EX PADS
6.0000 | MEDICATED_PAD | Freq: Every day | CUTANEOUS | Status: DC
  Administered 2024-02-24 – 2024-02-26 (×3): 6 via TOPICAL

## 2024-02-24 NOTE — Plan of Care (Signed)
  Problem: Education: Goal: Ability to describe self-care measures that may prevent or decrease complications (Diabetes Survival Skills Education) will improve Outcome: Progressing Goal: Individualized Educational Video(s) Outcome: Progressing   Problem: Coping: Goal: Ability to adjust to condition or change in health will improve Outcome: Progressing   Problem: Fluid Volume: Goal: Ability to maintain a balanced intake and output will improve Outcome: Progressing   Problem: Health Behavior/Discharge Planning: Goal: Ability to identify and utilize available resources and services will improve Outcome: Progressing Goal: Ability to manage health-related needs will improve Outcome: Progressing   Problem: Metabolic: Goal: Ability to maintain appropriate glucose levels will improve Outcome: Progressing   Problem: Nutritional: Goal: Maintenance of adequate nutrition will improve Outcome: Progressing Goal: Progress toward achieving an optimal weight will improve Outcome: Progressing   Problem: Skin Integrity: Goal: Risk for impaired skin integrity will decrease Outcome: Progressing   Problem: Tissue Perfusion: Goal: Adequacy of tissue perfusion will improve Outcome: Progressing   Problem: Education: Goal: Knowledge of General Education information will improve Description: Including pain rating scale, medication(s)/side effects and non-pharmacologic comfort measures Outcome: Progressing   Problem: Health Behavior/Discharge Planning: Goal: Ability to manage health-related needs will improve Outcome: Progressing   Problem: Clinical Measurements: Goal: Ability to maintain clinical measurements within normal limits will improve Outcome: Progressing Goal: Will remain free from infection Outcome: Progressing Goal: Diagnostic test results will improve Outcome: Progressing Goal: Respiratory complications will improve Outcome: Progressing Goal: Cardiovascular complication will  be avoided Outcome: Progressing   Problem: Activity: Goal: Risk for activity intolerance will decrease Outcome: Progressing   Problem: Nutrition: Goal: Adequate nutrition will be maintained Outcome: Progressing   Problem: Coping: Goal: Level of anxiety will decrease Outcome: Progressing   Problem: Elimination: Goal: Will not experience complications related to bowel motility Outcome: Progressing Goal: Will not experience complications related to urinary retention Outcome: Progressing   Problem: Pain Managment: Goal: General experience of comfort will improve and/or be controlled Outcome: Progressing   Problem: Safety: Goal: Ability to remain free from injury will improve Outcome: Progressing   Problem: Skin Integrity: Goal: Risk for impaired skin integrity will decrease Outcome: Progressing   Problem: Education: Goal: Knowledge of the prescribed therapeutic regimen will improve Outcome: Progressing Goal: Ability to verbalize activity precautions or restrictions will improve Outcome: Progressing Goal: Understanding of discharge needs will improve Outcome: Progressing   Problem: Activity: Goal: Ability to perform//tolerate increased activity and mobilize with assistive devices will improve Outcome: Progressing   Problem: Clinical Measurements: Goal: Postoperative complications will be avoided or minimized Outcome: Progressing   Problem: Self-Care: Goal: Ability to meet self-care needs will improve Outcome: Progressing   Problem: Self-Concept: Goal: Ability to maintain and perform role responsibilities to the fullest extent possible will improve Outcome: Progressing   Problem: Pain Management: Goal: Pain level will decrease with appropriate interventions Outcome: Progressing   Problem: Skin Integrity: Goal: Demonstration of wound healing without infection will improve Outcome: Progressing

## 2024-02-24 NOTE — Plan of Care (Signed)
 Pt has rested quietly throughout the night with no distress noted. Alert and oriented. ON room air. SR on the monitor. Voids per urinal, has oliguria. Medicated once with dilaudid  with relief noted. Bath given and linens changed. Pt slept most of shift. No complaints voiced.       Problem: Tissue Perfusion: Goal: Adequacy of tissue perfusion will improve Outcome: Progressing   Problem: Education: Goal: Knowledge of General Education information will improve Description: Including pain rating scale, medication(s)/side effects and non-pharmacologic comfort measures Outcome: Progressing   Problem: Clinical Measurements: Goal: Respiratory complications will improve Outcome: Progressing Goal: Cardiovascular complication will be avoided Outcome: Progressing   Problem: Activity: Goal: Risk for activity intolerance will decrease Outcome: Progressing   Problem: Pain Managment: Goal: General experience of comfort will improve and/or be controlled Outcome: Progressing

## 2024-02-24 NOTE — Progress Notes (Signed)
 PT Cancellation Note  Patient Details Name: Thomas [name] MRN: [id number] DOB: [birth year]   Cancelled Treatment:    Reason Eval/Treat Not Completed: Patient initially declines due to fatigue, later attributes it to pain wanting PT to come back but still not sure he would be willing to participate later. Reviewed educ on importance of mobility and pt continues to decline to sit up or get OOB with therapies; PT to sign off next session if pt continues to decline, pt states, "I understand."   Spoke with pt regarding d/c plans since he has declined SNF offers; pt states, "Well that's fine... I'd rather go home to die than die at one of those places..."   Maliki Gignac, PT, DPT Acute Rehabilitation Services  Personal: Secure Chat Rehab Office: [phone]  Albino Hum 02/24/2024, 1:08 PM

## 2024-02-24 NOTE — TOC Progression Note (Signed)
 Transition of Care [facility]) - Progression Note    Patient Details  Name: CHESTERFIELD MERCADEL MRN: [id number] Date of Birth: 09-17-1951  Transition of [facility] CM/SW Contact  Jannice Mends, LCSW Phone Number: 02/24/2024, 9:03 AM  Clinical Narrative:    TOC continuing to follow for medical readiness to home since patient has declined SNF offers.    Expected Discharge Plan: Home w Home Health Services Barriers to Discharge: Continued Medical Work up  Expected Discharge Plan and Services     Post Acute Care Choice: Skilled Nursing Facility Living arrangements for the past 2 months: Single Family Home                                       Social Determinants of Health (SDOH) Interventions SDOH Screenings   Food Insecurity: No Food Insecurity (02/09/2024)  Housing: Low Risk  (02/09/2024)  Transportation Needs: No Transportation Needs (02/09/2024)  Utilities: Not At Risk (02/09/2024)  Depression (PHQ2-9): Low Risk  (02/08/2020)  Social Connections: Socially Isolated (02/11/2024)  Tobacco Use: Low Risk  (02/07/2024)    Readmission Risk Interventions    01/15/2022    2:56 PM 01/02/2022    2:03 PM 12/31/2021   10:32 AM  Readmission Risk Prevention Plan  Transportation Screening Complete Complete Complete  Medication Review Oceanographer) Complete Complete Complete  PCP or Specialist appointment within 3-5 days of discharge Complete Complete Complete  HRI or Home Care Consult Complete Complete Complete  SW Recovery Care/Counseling Consult Complete Complete Complete  Palliative Care Screening Not Applicable Not Applicable Not Applicable  Skilled Nursing Facility Not Applicable Not Applicable Not Applicable

## 2024-02-24 NOTE — Progress Notes (Addendum)
 PROGRESS NOTE        PATIENT DETAILS Name: Thomas [name] Age: 73 y.o. Sex: male Date of Birth: 1950/11/14 Admit Date: 02/07/2024 Admitting Physician Roxana Copier, DO PPI:RJJOAC, [name], MD  Brief Summary: Patient is a 73 y.o.  male who with history of ESRD on HD TTS, history of posterior fusion at T10-12 due to osteomyelitis/discitis/epidural abscess in 2021, history of right BKA who recently completed 6 weeks of daptomycin /cefepime  on 3/28 for left foot infection with osteomyelitis-presented with low back pain-upon further evaluation with CT imaging-he was found to have acute osteomyelitis involving his thoracic spine and admitted to the hospitalist service.  Significant events: 4/20>> admit to TRH-back pain-concern for thoracic osteomyelitis.  Significant studies: 4/21>> x-ray right elbow: No fracture. 4/21>> x-ray left wrist: No fracture 4/21>> x-ray left shoulder, no fracture. 4/21>> CT head: No acute intracranial abnormality 4/21>> CT C-spine: No fracture. 4/21>> CT abdomen/pelvis: No acute injury to chest/abdomen/pelvis 4/21>> CT T-spine: Prior posterior fusion from T10-T12-abnormal appearance with collapse of T10-lucency around T10 screws at the T9-10 disc space, T10-11 disc space concerning for discitis/osteomyelitis. 4/21>> CT L-spine: No acute bony abnormality, mild chronic compression fracture at L1. 4/22>> x-ray left foot: Chronic erosive changes about the forefoot similar to 11/21/2023-osteomyelitis/septic arthritis of the 1st/2nd MTP joints difficult to exclude. 4/25>> left ABI: Within normal limit.  Significant microbiology data: 4/22>> blood culture: No growth  Procedures: 4/30>> left transmetatarsal amputation by Dr. Julio Ohm.  Consults: Neurosurgery Infectious disease Ortho  Subjective:  Patient in bed, appears comfortable - sleeping again this am, denies any headache, no fever, no chest pain or pressure, no shortness of breath , no  abdominal pain. No new focal weakness.  Objective: Vitals: Blood pressure 124/82, pulse 89, temperature 97.7 F (36.5 C), temperature source Oral, resp. rate 14, height 6\' 5"  (1.956 m), weight 91.3 kg, SpO2 100%.   Exam:  Awake/alert, in absolutely no distress Chest: Clear to auscultation CVS: S1-S2 regular Abdomen: Soft nontender nondistended Extremities: Left TMA dressing in place, s/p chronic right AKA.  ment/Plan:  Acute osteomyelitis with discitis involving T9 to T11 vertebra Per neurosurgery not an operative candidate Per IR-no window to aspirate Plan is for 6 weeks of vancomycin /cefepime  with hemodialysis-EOT 03/23/24 Blood cultures negative so far Back pain better on long and short acting Oxy codon, stop IV Dilaudid , he is in no distress and always found sleeping comfortably Continue to wear TLSO brace when out of the bed. Note patient is on high doses of narcotics chronically, exhibits some narcotic seeking behavior, counseled to avoid excessive use to avoid accidental overdose, as needed Narcan  on board.  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 Chronic left second toe ulceration He just completed 6 weeks of daptomycin /cefepime  on 3/28-has refused amputation in the past.  Concern is that he may have seeded his spine through foot infection.  Subsequently evaluated by Dr. Julio Ohm on 4/28-underwent TMA on 4/30.  Toe-touch weightbearing on that leg.  Transient hypotension on 4/30 Now stable on low-dose Norvasc ,-clonidine /losartan /metoprolol  remains on hold-continue to monitor and add as appropriate.  Constipation Chronic issue-secondary to narcotics Finally had a BM after several days-remains on MiraLAX /senna-Movantik  added on 5/2. Dulcolax suppository as needed.  Mechanical fall Numerous imaging studies negative for fracture PT/OT eval  ESRD on HD TTS Nephrology following and directing HD care  Hyperkalemia on 02/20/2024 He underwent HD 02/20/2024  afternoon, 2 doses of Lokelma  on 02/22/2024, defer to nephrology  Prolonged QTc Twelve-lead EKG on 4/30 with normal QTc Telemetry monitoring  Normocytic anemia Secondary to ESRD Aranesp /iron  deferred to nephrology service.  Chronic HFpEF Euvolemic on exam Fluid removal with HD  HTN See above regarding hypotension BP relatively stable-see above discussion.  HLD Statin  Hypothyroidism Synthroid   BPH Flomax /bethanechol   History of right BKA  Debility/deconditioning Very reluctant to go to SNF-difficult situation-he has s/p right AKA-now left TMA several days ago-with touchdown weightbearing status.  Lives alone-minimal family assistance-his brother helps him some.  After extensive discussion-he seems a bit more accepting of the fact that he may have to go to SNF short-term.  BMI: Estimated body mass index is 23.87 kg/m as calculated from the following:   Height as of this encounter: 6\' 5"  (1.956 m).   Weight as of this encounter: 91.3 kg.   Code status:   Code Status: Full Code   DVT Prophylaxis: SCD's Start: 02/17/24 1538 heparin  injection 5,000 Units Start: 02/10/24 1400 SCDs Start: 02/08/24 2206   Family Communication: Brother-[phone] updated 5/1   Disposition Plan: Status is: Inpatient Remains inpatient appropriate because: Severity of illness   Planned Discharge Destination: SNF   Diet: Diet Order             Diet renal with fluid restriction Fluid restriction: 1200 mL Fluid; Room service appropriate? Yes; Fluid consistency: Thin  Diet effective now                     Antimicrobial agents: Anti-infectives (From admission, onward)    Start     Dose/Rate Route Frequency Ordered Stop   02/23/24 1800  vancomycin  (VANCOREADY) IVPB 750 mg/150 mL        750 mg 150 mL/hr over 60 Minutes Intravenous Every T-Th-Sa (Hemodialysis) 02/22/24 1432     02/23/24 0000  vancomycin  IVPB        750 mg Intravenous Every T-Th-Sa (Hemodialysis) 02/22/24  1447 03/23/24 2359   02/11/24 1800  ceFEPIme  (MAXIPIME ) 2 g in sodium chloride  0.9 % 100 mL IVPB        2 g 200 mL/hr over 30 Minutes Intravenous Every T-Th-Sa (Hemodialysis) 02/10/24 1028     02/11/24 1200  vancomycin  (VANCOCIN ) IVPB 1000 mg/200 mL premix  Status:  Discontinued        1,000 mg 200 mL/hr over 60 Minutes Intravenous Every T-Th-Sa (Hemodialysis) 02/10/24 1028 02/22/24 1432   02/11/24 0000  ceFEPime  (MAXIPIME ) IVPB        2 g Intravenous Every T-Th-Sa (Hemodialysis) 02/11/24 0747 03/23/24 2359   02/11/24 0000  vancomycin  IVPB  Status:  Discontinued        1,000 mg Intravenous Every T-Th-Sa (Hemodialysis) 02/11/24 0747 02/22/24    02/10/24 1115  vancomycin  (VANCOREADY) IVPB 2000 mg/400 mL        2,000 mg 200 mL/hr over 120 Minutes Intravenous  Once 02/10/24 1028 02/10/24 1446   02/10/24 1115  ceFEPIme  (MAXIPIME ) 2 g in sodium chloride  0.9 % 100 mL IVPB        2 g 200 mL/hr over 30 Minutes Intravenous  Once 02/10/24 1028 02/10/24 1239        MEDICATIONS: Scheduled Meds:  amLODipine   5 mg Oral Daily   vitamin C   1,000 mg Oral Daily   atorvastatin   80 mg Oral Daily   bethanechol   10 mg Oral BID   Chlorhexidine  Gluconate Cloth  6 each Topical Q0600   darbepoetin (ARANESP )  injection - DIALYSIS  100 mcg Subcutaneous Q Sat-1800   heparin  injection (subcutaneous)  5,000 Units Subcutaneous Q8H   lactulose   30 g Oral Daily   leptospermum manuka honey  1 Application Topical Daily   levothyroxine   50 mcg Oral QAC breakfast   naloxegol  oxalate  12.5 mg Oral Daily   nutrition supplement (JUVEN)  1 packet Oral BID BM   oxyCODONE   30 mg Oral Q8H   polyethylene glycol  17 g Oral BID   senna-docusate  4 tablet Oral BID   sevelamer  carbonate  1,600 mg Oral TID WC   sodium zirconium cyclosilicate   10 g Oral BID   tamsulosin   0.4 mg Oral Daily   zinc  sulfate (50mg  elemental zinc )  220 mg Oral Daily   Continuous Infusions:  sodium chloride  10 mL/hr at 02/17/24 1600   ceFEPime   (MAXIPIME ) IV 2 g (02/23/24 2121)   promethazine  (PHENERGAN ) injection (IM or IVPB)     vancomycin  Stopped (02/23/24 1349)   PRN Meds:.acetaminophen  **OR** acetaminophen , bisacodyl , HYDROmorphone  (DILAUDID ) injection, melatonin, naLOXone  (NARCAN )  injection, ondansetron  (ZOFRAN ) IV, oxyCODONE , promethazine  (PHENERGAN ) injection (IM or IVPB), tiZANidine    I have personally reviewed following labs and imaging studies  LABORATORY DATA: CBC: Recent Labs  Lab 02/18/24 0504 02/20/24 0446 02/22/24 0419 02/23/24 0813 02/24/24 0438  WBC 6.7 6.4 5.7 7.1 6.7  NEUTROABS  --   --   --  5.0  --   HGB 9.6* 8.5* 8.8* 9.5* 8.9*  HCT 29.9* 26.6* 27.4* 29.8* 27.4*  MCV 105.7* 105.6* 107.5* 106.0* 106.2*  PLT 151 151 165 173 172    Basic Metabolic Panel: Recent Labs  Lab 02/18/24 0504 02/20/24 0446 02/22/24 0419 02/23/24 0813 02/24/24 0438  NA 139 137 139 139 136  K 5.1 5.9* 5.5* 6.2* 5.9*  CL 102 101 101 100 96*  CO2 20* 24 24 20* 25  GLUCOSE 91 84 113* 95 88  BUN 63* 52* 63* 75* 48*  CREATININE 7.75* 7.10* 7.60* 9.22* 7.12*  CALCIUM  8.5* 8.4* 8.9 8.7* 8.8*  PHOS 6.9* 7.8* 8.4* 10.1* 7.2*    GFR: Estimated Creatinine Clearance: 12.2 mL/min (A) (by C-G formula based on SCr of 7.12 mg/dL (H)).  Liver Function Tests: Recent Labs  Lab 02/18/24 0504 02/20/24 0446 02/22/24 0419 02/23/24 0813 02/24/24 0438  ALBUMIN  3.4* 3.2* 3.0* 3.3* 3.0*   No results for input(s): "LIPASE", "AMYLASE" in the last 168 hours. No results for input(s): "AMMONIA" in the last 168 hours.  Urine analysis:    Component Value Date/Time   COLORURINE YELLOW 01/14/2022 0248   APPEARANCEUR HAZY (A) 01/14/2022 0248   LABSPEC 1.009 01/14/2022 0248   PHURINE 7.0 01/14/2022 0248   GLUCOSEU NEGATIVE 01/14/2022 0248   HGBUR MODERATE (A) 01/14/2022 0248   BILIRUBINUR NEGATIVE 01/14/2022 0248   KETONESUR NEGATIVE 01/14/2022 0248   PROTEINUR 100 (A) 01/14/2022 0248   NITRITE NEGATIVE 01/14/2022 0248    LEUKOCYTESUR LARGE (A) 01/14/2022 0248    Sepsis Labs: Lactic Acid, Venous    Component Value Date/Time   LATICACIDVEN 1.0 12/31/2021 0048    MICROBIOLOGY: No results found for this or any previous visit (from the past 240 hours).   RADIOLOGY STUDIES/RESULTS: No results found.     LOS: 16 days   Signature  -    Lynnwood Sauer M.D on 02/24/2024 at 10:20 AM   -  To page go to [email or web address]

## 2024-02-24 NOTE — TOC Progression Note (Signed)
 Transition of Care [facility]) - Progression Note    Patient Details  Name: Thomas [name] MRN: [id number] Date of Birth: 07-01-1951  [facility] CM/SW Contact  Jannice Mends, LCSW Phone Number: 02/24/2024, 9:02 AM  Clinical Narrative:    TOC continuing to follow for medical readiness to home since patient has declined SNF offers.    Expected Discharge Plan: Home w Home Health Services Barriers to Discharge: Continued Medical Work up  Expected Discharge Plan and Services     Post Acute Care Choice: Skilled Nursing Facility Living arrangements for the past 2 months: Single Family Home                                       Social Determinants of Health (SDOH) Interventions SDOH Screenings   Food Insecurity: No Food Insecurity (02/09/2024)  Housing: Low Risk  (02/09/2024)  Transportation Needs: No Transportation Needs (02/09/2024)  Utilities: Not At Risk (02/09/2024)  Depression (PHQ2-9): Low Risk  (02/08/2020)  Social Connections: Socially Isolated (02/11/2024)  Tobacco Use: Low Risk  (02/07/2024)    Readmission Risk Interventions    01/15/2022    2:56 PM 01/02/2022    2:03 PM 12/31/2021   10:32 AM  Readmission Risk Prevention Plan  Transportation Screening Complete Complete Complete  Medication Review Oceanographer) Complete Complete Complete  PCP or Specialist appointment within 3-5 days of discharge Complete Complete Complete  HRI or Home Care Consult Complete Complete Complete  SW Recovery Care/Counseling Consult Complete Complete Complete  Palliative Care Screening Not Applicable Not Applicable Not Applicable  Skilled Nursing Facility Not Applicable Not Applicable Not Applicable

## 2024-02-24 NOTE — Progress Notes (Signed)
 [facility] Progress Note   Subjective:    Seen and examined patient at bedside. No issues. Ran even with dialysis yesterday 2nd ongoing cramping. Noted K+ 5.9 and Lokelma  X 2 ordered. Next HD 5/8.  Objective Vitals:   02/24/24 0028 02/24/24 0445 02/24/24 0745 02/24/24 1000  BP:    124/82  Pulse:    89  Resp:    14  Temp: 98.7 F (37.1 C) 98.2 F (36.8 C) 97.7 F (36.5 C)   TempSrc:  Oral Oral   SpO2:   95% 100%  Weight:      Height:       Physical Exam General: Elderly male; chronically-ill appearing; NAD; on RA Heart: S1 and S2; No murmurs, gallops, or rubs Lungs: Diminished lower bases; Clear anteriorly Abdomen: Soft and non-tender Extremities: R BKA; + L TMA wrapped Dialysis Access: [facility]   Filed Weights   02/23/24 0500 02/23/24 0915 02/23/24 1330  Weight: 91.3 kg 91.3 kg 91.3 kg    Intake/Output Summary (Last 24 hours) at 02/24/2024 1101 Last data filed at 02/23/2024 1330 Gross per 24 hour  Intake --  Output 0 ml  Net 0 ml    Additional Objective Labs: Basic Metabolic Panel: Recent Labs  Lab 02/22/24 0419 02/23/24 0813 02/24/24 0438  NA 139 139 136  K 5.5* 6.2* 5.9*  CL 101 100 96*  CO2 24 20* 25  GLUCOSE 113* 95 88  BUN 63* 75* 48*  CREATININE 7.60* 9.22* 7.12*  CALCIUM  8.9 8.7* 8.8*  PHOS 8.4* 10.1* 7.2*   Liver Function Tests: Recent Labs  Lab 02/22/24 0419 02/23/24 0813 02/24/24 0438  ALBUMIN  3.0* 3.3* 3.0*   No results for input(s): "LIPASE", "AMYLASE" in the last 168 hours. CBC: Recent Labs  Lab 02/18/24 0504 02/20/24 0446 02/22/24 0419 02/23/24 0813 02/24/24 0438  WBC 6.7 6.4 5.7 7.1 6.7  NEUTROABS  --   --   --  5.0  --   HGB 9.6* 8.5* 8.8* 9.5* 8.9*  HCT 29.9* 26.6* 27.4* 29.8* 27.4*  MCV 105.7* 105.6* 107.5* 106.0* 106.2*  PLT 151 151 165 173 172   Blood Culture    Component Value Date/Time   SDES BLOOD BLOOD RIGHT HAND AEROBIC BOTTLE ONLY 02/09/2024 0414   SPECREQUEST  02/09/2024 0414    BOTTLES DRAWN  AEROBIC ONLY Blood Culture adequate volume   CULT  02/09/2024 0414    NO GROWTH 5 DAYS Performed at [facility], [address] [place], [place] [address]    REPTSTATUS 02/14/2024 FINAL 02/09/2024 0414    Cardiac Enzymes: No results for input(s): "CKTOTAL", "CKMB", "CKMBINDEX", "TROPONINI" in the last 168 hours. CBG: Recent Labs  Lab 02/22/24 1515 02/22/24 2248 02/23/24 0815 02/23/24 2021 02/24/24 0742  GLUCAP 105* 111* 87 99 81   Iron  Studies: No results for input(s): "IRON ", "TIBC", "TRANSFERRIN", "FERRITIN" in the last 72 hours. Lab Results  Component Value Date   INR 1.1 02/09/2024   INR 1.1 02/08/2024   INR 1.3 (H) 01/06/2024   Studies/Results: No results found.  Medications:  sodium chloride  10 mL/hr at 02/17/24 1600   ceFEPime  (MAXIPIME ) IV 2 g (02/23/24 2121)   promethazine  (PHENERGAN ) injection (IM or IVPB)     vancomycin  Stopped (02/23/24 1349)    amLODipine   5 mg Oral Daily   vitamin C   1,000 mg Oral Daily   atorvastatin   80 mg Oral Daily   bethanechol   10 mg Oral BID   Chlorhexidine  Gluconate Cloth  6 each Topical  Q0600   darbepoetin (ARANESP ) injection - DIALYSIS  100 mcg Subcutaneous Q Sat-1800   heparin  injection (subcutaneous)  5,000 Units Subcutaneous Q8H   lactulose   30 g Oral Daily   leptospermum manuka honey  1 Application Topical Daily   levothyroxine   50 mcg Oral QAC breakfast   naloxegol  oxalate  12.5 mg Oral Daily   nutrition supplement (JUVEN)  1 packet Oral BID BM   oxyCODONE   30 mg Oral Q8H   polyethylene glycol  17 g Oral BID   senna-docusate  4 tablet Oral BID   sevelamer  carbonate  1,600 mg Oral TID WC   sodium zirconium cyclosilicate   10 g Oral BID   tamsulosin   0.4 mg Oral Daily   zinc  sulfate (50mg  elemental zinc )  220 mg Oral Daily    Dialysis Orders: TTS [place] 4h  B400  92kg  TDC  Heparin  none - Mircera 225mcg IV q 2 weeks (last 4/9) - Calcitriol  0.35mcg PO q HD  Assessment/Plan: Thoracic discitis/OM:  recent acute on chronic back pain, s/p fall on 4/20. CT imaging showed T9-10 discitis. He refused MRI, area was too small for aspiration per IR. Earlier this year he completed an 8 week course of Dapto/Cefepime  on 3/20 for bacteremia presumed due to foot infection. Concerned that this infection may have seeded the spine. Blood cx's were negative. Per ortho pt had L TMA on 4/30. Discussed with ID pharmacy on 5/5: continue  IV Vanc 750mg  and IV Cefepime  2gm with HD x 6 weeks thru 03/23/24 for the discitis.  Hyperkalemia - levels have been elevated lately. Lokelma  X 2 doses already ordered today for K+ 5.9. Follow trend. Will consider Lokelma  daily until level stabilizes. ESRD:  Usual TTS schedule. Next HD 5/8. HTN/volume: bp's okay, at EDW.  Has some residual UOP. Continue with minimal UF or even with HD tomorrow.   Anemia: HGB 8-9s. Follow labs. On weekly ESA. No venofer in setting of infection. Metabolic bone disease: Ca ok, Phos high - resumed home binders. Nutrition: Alb 3.4, follow. 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  Thomas Maxwell, NP [facility] 02/24/2024,11:01 AM  LOS: 16 days

## 2024-02-24 NOTE — Progress Notes (Signed)
 Occupational Therapy Treatment Patient Details Name: Thomas [name] MRN: [id number] DOB: [birth year] Today's Date: 02/24/2024   History of present illness The pt is a 73 yo male presenting 4/20 with back pain after a fall at home. 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 Pt agreeable to OT session initially but then deferring OOB or EOB secondary to fatigue today. Pt agreeable to bed level exercises of upper and lower extremities. See below. Will continue to follow. Secondary to new weightbearing status, and significant change in functional ability, continue to highly recommend inpatient rehab <3 hours.       If plan is discharge home, recommend the following:  A lot of help with walking and/or transfers;A lot of help with bathing/dressing/bathroom;Assist for transportation;Assistance with cooking/housework   Equipment Recommendations  Hospital bed    Recommendations for Other Services      Precautions / Restrictions Precautions Precautions: Back;Fall (back for comfort) Precaution Booklet Issued: Yes (comment) Recall of Precautions/Restrictions: Intact Required Braces or Orthoses: Spinal Brace;Other Brace Spinal Brace: Thoracolumbosacral orthotic;Applied in sitting position Other Brace: Post op shoe Restrictions Weight Bearing Restrictions Per Provider Order: Yes LLE Weight Bearing Per Provider Order: Touchdown weight bearing Other Position/Activity Restrictions: Post op shoe       Mobility Bed Mobility                    Transfers                         Balance                                            ADL either performed or assessed with clinical judgement   ADL Overall ADL's : Needs assistance/impaired Eating/Feeding: Set up;Bed level                                     General ADL Comments: pt deferred EOB and OOB, thus, performed exercises at bed level    Extremity/Trunk Assessment Upper Extremity Assessment Upper Extremity Assessment: Generalized weakness   Lower Extremity Assessment Lower Extremity Assessment: Defer to PT evaluation        Vision       Perception     Praxis     Communication Communication Communication: No apparent difficulties   Cognition Arousal: Alert Behavior During Therapy: WFL for tasks assessed/performed Cognition: No apparent impairments                               Following commands: Intact        Cueing   Cueing Techniques: Verbal cues  Exercises Exercises: Other exercises Other Exercises Other Exercises: Pt performing 12 repetitions elbow flexion/extension, shoulder abduction, forward punches and BLE reciprical flexion like bicycle.    Shoulder Instructions       General Comments      Pertinent Vitals/ Pain       Pain  Assessment Pain Assessment: Faces Faces Pain Scale: Hurts little more Pain Location: RUE, neck Pain Descriptors / Indicators: Aching Pain Intervention(s): Limited activity within patient's tolerance, Monitored during session  Home Living                                          Prior Functioning/Environment              Frequency  Min 1X/week        Progress Toward Goals  OT Goals(current goals can now be found in the care plan section)  Progress towards OT goals: Progressing toward goals  Acute Rehab OT Goals Patient Stated Goal: go home OT Goal Formulation: With patient Time For Goal Achievement: 03/04/24 Potential to Achieve Goals: Fair ADL Goals Pt Will Perform Upper Body Dressing: with set-up;sitting Pt Will Perform  Lower Body Dressing: with mod assist;with adaptive equipment;sitting/lateral leans Pt Will Transfer to Toilet: with mod assist;bedside commode Pt Will Perform Toileting - Clothing Manipulation and hygiene: with mod assist  Plan      Co-evaluation                 AM-PAC OT "6 Clicks" Daily Activity     Outcome Measure   Help from another person eating meals?: None Help from another person taking care of personal grooming?: A Little Help from another person toileting, which includes using toliet, bedpan, or urinal?: A Lot Help from another person bathing (including washing, rinsing, drying)?: A Lot Help from another person to put on and taking off regular upper body clothing?: A Little Help from another person to put on and taking off regular lower body clothing?: A Lot 6 Click Score: 16    End of Session    OT Visit Diagnosis: Unsteadiness on feet (R26.81);Repeated falls (R29.6);History of falling (Z91.81);Muscle weakness (generalized) (M62.81);Pain   Activity Tolerance Patient limited by pain   Patient Left in bed;with call bell/phone within reach;with bed alarm set   Nurse Communication Patient requests pain meds        Time: 8295-6213 OT Time Calculation (min): 31 min  Charges: OT General Charges $OT Visit: 1 Visit OT Treatments $Therapeutic Exercise: 23-37 mins  Thomas [name], OTR/L [facility] Acute Rehabilitation Office: [phone]   Thomas [name] 02/24/2024, 4:48 PM

## 2024-02-25 DIAGNOSIS — M4624 Osteomyelitis of vertebra, thoracic region: Secondary | ICD-10-CM | POA: Diagnosis not present

## 2024-02-25 LAB — GLUCOSE, CAPILLARY
Glucose-Capillary: 90 mg/dL (ref 70–99)
Glucose-Capillary: 120 mg/dL — ABNORMAL HIGH (ref 70–99)
Glucose-Capillary: 157 mg/dL — ABNORMAL HIGH (ref 70–99)
Glucose-Capillary: 117 mg/dL — ABNORMAL HIGH (ref 70–99)

## 2024-02-25 LAB — RENAL FUNCTION PANEL
Albumin: 3.2 g/dL — ABNORMAL LOW (ref 3.5–5.0)
Anion gap: 16 — ABNORMAL HIGH (ref 5–15)
BUN: 73 mg/dL — ABNORMAL HIGH (ref 8–23)
CO2: 23 mmol/L (ref 22–32)
Calcium: 8.9 mg/dL (ref 8.9–10.3)
Chloride: 98 mmol/L (ref 98–111)
Creatinine, Ser: 8.83 mg/dL — ABNORMAL HIGH (ref 0.61–1.24)
GFR, Estimated: 6 mL/min — ABNORMAL LOW (ref >60)
Glucose, Bld: 97 mg/dL (ref 70–99)
Phosphorus: 8.5 mg/dL — ABNORMAL HIGH (ref 2.5–4.6)
Potassium: 4.9 mmol/L (ref 3.5–5.1)
Sodium: 137 mmol/L (ref 135–145)

## 2024-02-25 MED ORDER — VANCOMYCIN HCL 750 MG/150ML IV SOLN
INTRAVENOUS | Status: AC
  Filled 2024-02-25: qty 150

## 2024-02-25 MED ORDER — ALTEPLASE 2 MG IJ SOLR: 2.0000 mg | Freq: Once | INTRAMUSCULAR | Status: DC | PRN

## 2024-02-25 MED ORDER — HEPARIN SODIUM (PORCINE) 1000 UNIT/ML DIALYSIS: 1000.0000 [IU] | INTRAMUSCULAR | Status: DC | PRN

## 2024-02-25 MED ORDER — HEPARIN SODIUM (PORCINE) 1000 UNIT/ML IJ SOLN
INTRAMUSCULAR | Status: AC
Start: 2024-02-25 — End: ?
  Filled 2024-02-25: qty 3

## 2024-02-25 MED ORDER — OXYCODONE HCL 5 MG PO TABS
ORAL_TABLET | ORAL | Status: AC
  Filled 2024-02-25: qty 3

## 2024-02-25 NOTE — Progress Notes (Signed)
 PROGRESS NOTE        PATIENT DETAILS Name: Thomas [name] Age: 73 y.o. Sex: male Date of Birth: 1951/03/13 Admit Date: 02/07/2024 Admitting Physician Roxana Copier, DO ZOX:WRUEAV, [name], MD  Brief Summary: Patient is a 73 y.o.  male who with history of ESRD on HD TTS, history of posterior fusion at T10-12 due to osteomyelitis/discitis/epidural abscess in 2021, history of right BKA who recently completed 6 weeks of daptomycin /cefepime  on 3/28 for left foot infection with osteomyelitis-presented with low back pain-upon further evaluation with CT imaging-he was found to have acute osteomyelitis involving his thoracic spine and admitted to the hospitalist service.  Significant events: 4/20>> admit to TRH-back pain-concern for thoracic osteomyelitis.  Significant studies: 4/21>> x-ray right elbow: No fracture. 4/21>> x-ray left wrist: No fracture 4/21>> x-ray left shoulder, no fracture. 4/21>> CT head: No acute intracranial abnormality 4/21>> CT C-spine: No fracture. 4/21>> CT abdomen/pelvis: No acute injury to chest/abdomen/pelvis 4/21>> CT T-spine: Prior posterior fusion from T10-T12-abnormal appearance with collapse of T10-lucency around T10 screws at the T9-10 disc space, T10-11 disc space concerning for discitis/osteomyelitis. 4/21>> CT L-spine: No acute bony abnormality, mild chronic compression fracture at L1. 4/22>> x-ray left foot: Chronic erosive changes about the forefoot similar to 11/21/2023-osteomyelitis/septic arthritis of the 1st/2nd MTP joints difficult to exclude. 4/25>> left ABI: Within normal limit.  Significant microbiology data: 4/22>> blood culture: No growth  Procedures: 4/30>> left transmetatarsal amputation by Dr. Julio Ohm.  Consults: Neurosurgery Infectious disease Ortho  Subjective:   Patient in bed, appears comfortable, denies any headache, no fever, no chest pain or pressure, no shortness of breath , no abdominal pain. No new  focal weakness.   Objective: Vitals: Blood pressure (!) 154/92, pulse 72, temperature 97.8 F (36.6 C), temperature source Oral, resp. rate 13, height 6\' 5"  (1.956 m), weight 91 kg, SpO2 97%.   Exam:  Awake/alert, in absolutely no distress Chest: Clear to auscultation CVS: S1-S2 regular Abdomen: Soft nontender nondistended Extremities: Left TMA dressing in place, s/p chronic right AKA.   Assessment/Plan:  Acute osteomyelitis with discitis involving T9 to T11 vertebra Per neurosurgery not an operative candidate Per IR-no window to aspirate Plan is for 6 weeks of vancomycin /cefepime  with hemodialysis-EOT 03/23/24 Blood cultures negative so far Back pain better on long and short acting Oxy codon, stop IV Dilaudid , he is in no distress and always found sleeping comfortably Continue to wear TLSO brace when out of the bed. Note patient is on high doses of narcotics chronically, exhibits some narcotic seeking behavior, counseled to avoid excessive use to avoid accidental overdose, as needed Narcan  on board.  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 Chronic left second toe ulceration He just completed 6 weeks of daptomycin /cefepime  on 3/28-has refused amputation in the past.  Concern is that he may have seeded his spine through foot infection.  Subsequently evaluated by Dr. Julio Ohm on 4/28-underwent TMA on 4/30.  Toe-touch weightbearing on that leg.  Transient hypotension on 4/30 Now stable on low-dose Norvasc ,-clonidine /losartan /metoprolol  remains on hold-continue to monitor and add as appropriate.  Constipation Chronic issue-secondary to narcotics Finally had a BM after several days-remains on MiraLAX /senna-Movantik  added on 5/2. Dulcolax suppository as needed.  Mechanical fall Numerous imaging studies negative for fracture PT/OT eval  ESRD on HD TTS Nephrology following and directing HD care  Hyperkalemia on 02/20/2024 He underwent HD 02/20/2024 afternoon,  2 doses of  Lokelma  on 02/22/2024, defer to nephrology  Prolonged QTc Twelve-lead EKG on 4/30 with normal QTc Telemetry monitoring  Normocytic anemia Secondary to ESRD Aranesp /iron  deferred to nephrology service.  Chronic HFpEF Euvolemic on exam Fluid removal with HD  HTN See above regarding hypotension BP relatively stable-see above discussion.  HLD Statin  Hypothyroidism Synthroid   BPH Flomax /bethanechol   History of right BKA  Debility/deconditioning Very reluctant to go to SNF-difficult situation-he has s/p right AKA-now left TMA several days ago-with touchdown weightbearing status.  Lives alone-minimal family assistance-his brother helps him some.  After extensive discussion-he seems a bit more accepting of the fact that he may have to go to SNF short-term.  BMI: Estimated body mass index is 23.79 kg/m as calculated from the following:   Height as of this encounter: 6\' 5"  (1.956 m).   Weight as of this encounter: 91 kg.   Code status:   Code Status: Full Code   DVT Prophylaxis: SCD's Start: 02/17/24 1538 heparin  injection 5,000 Units Start: 02/10/24 1400 SCDs Start: 02/08/24 2206   Family Communication: Brother-[id number] updated 02/18/24   Disposition Plan: Status is: Inpatient Remains inpatient appropriate because: Severity of illness   Planned Discharge Destination: SNF   Diet: Diet Order             Diet renal with fluid restriction Fluid restriction: 1200 mL Fluid; Room service appropriate? Yes; Fluid consistency: Thin  Diet effective now                     Antimicrobial agents: Anti-infectives (From admission, onward)    Start     Dose/Rate Route Frequency Ordered Stop   02/23/24 1800  vancomycin  (VANCOREADY) IVPB 750 mg/150 mL        750 mg 150 mL/hr over 60 Minutes Intravenous Every T-Th-Sa (Hemodialysis) 02/22/24 1432     02/23/24 0000  vancomycin  IVPB        750 mg Intravenous Every T-Th-Sa (Hemodialysis) 02/22/24 1447 03/23/24 2359    02/11/24 1800  ceFEPIme  (MAXIPIME ) 2 g in sodium chloride  0.9 % 100 mL IVPB        2 g 200 mL/hr over 30 Minutes Intravenous Every T-Th-Sa (Hemodialysis) 02/10/24 1028     02/11/24 1200  vancomycin  (VANCOCIN ) IVPB 1000 mg/200 mL premix  Status:  Discontinued        1,000 mg 200 mL/hr over 60 Minutes Intravenous Every T-Th-Sa (Hemodialysis) 02/10/24 1028 02/22/24 1432   02/11/24 0000  ceFEPime  (MAXIPIME ) IVPB        2 g Intravenous Every T-Th-Sa (Hemodialysis) 02/11/24 0747 03/23/24 2359   02/11/24 0000  vancomycin  IVPB  Status:  Discontinued        1,000 mg Intravenous Every T-Th-Sa (Hemodialysis) 02/11/24 0747 02/22/24    02/10/24 1115  vancomycin  (VANCOREADY) IVPB 2000 mg/400 mL        2,000 mg 200 mL/hr over 120 Minutes Intravenous  Once 02/10/24 1028 02/10/24 1446   02/10/24 1115  ceFEPIme  (MAXIPIME ) 2 g in sodium chloride  0.9 % 100 mL IVPB        2 g 200 mL/hr over 30 Minutes Intravenous  Once 02/10/24 1028 02/10/24 1239        MEDICATIONS: Scheduled Meds:  amLODipine   5 mg Oral Daily   vitamin C   1,000 mg Oral Daily   atorvastatin   80 mg Oral Daily   bethanechol   10 mg Oral BID   Chlorhexidine  Gluconate Cloth  6 each Topical Q0600   darbepoetin (ARANESP )  injection - DIALYSIS  100 mcg Subcutaneous Q Sat-1800   heparin  injection (subcutaneous)  5,000 Units Subcutaneous Q8H   lactulose   30 g Oral Daily   leptospermum manuka honey  1 Application Topical Daily   levothyroxine   50 mcg Oral QAC breakfast   naloxegol  oxalate  12.5 mg Oral Daily   nutrition supplement (JUVEN)  1 packet Oral BID BM   oxyCODONE   30 mg Oral Q8H   polyethylene glycol  17 g Oral BID   senna-docusate  4 tablet Oral BID   sevelamer  carbonate  1,600 mg Oral TID WC   tamsulosin   0.4 mg Oral Daily   zinc  sulfate (50mg  elemental zinc )  220 mg Oral Daily   Continuous Infusions:  sodium chloride  10 mL/hr at 02/17/24 1600   ceFEPime  (MAXIPIME ) IV 2 g (02/23/24 2121)   promethazine  (PHENERGAN ) injection  (IM or IVPB)     vancomycin  Stopped (02/23/24 1349)   PRN Meds:.acetaminophen  **OR** acetaminophen , alteplase , bisacodyl , heparin , melatonin, naLOXone  (NARCAN )  injection, ondansetron  (ZOFRAN ) IV, oxyCODONE , promethazine  (PHENERGAN ) injection (IM or IVPB), tiZANidine    I have personally reviewed following labs and imaging studies  LABORATORY DATA: CBC: Recent Labs  Lab 02/20/24 0446 02/22/24 0419 02/23/24 0813 02/24/24 0438  WBC 6.4 5.7 7.1 6.7  NEUTROABS  --   --  5.0  --   HGB 8.5* 8.8* 9.5* 8.9*  HCT 26.6* 27.4* 29.8* 27.4*  MCV 105.6* 107.5* 106.0* 106.2*  PLT 151 165 173 172    Basic Metabolic Panel: Recent Labs  Lab 02/20/24 0446 02/22/24 0419 02/23/24 0813 02/24/24 0438 02/25/24 0502  NA 137 139 139 136 137  K 5.9* 5.5* 6.2* 5.9* 4.9  CL 101 101 100 96* 98  CO2 24 24 20* 25 23  GLUCOSE 84 113* 95 88 97  BUN 52* 63* 75* 48* 73*  CREATININE 7.10* 7.60* 9.22* 7.12* 8.83*  CALCIUM  8.4* 8.9 8.7* 8.8* 8.9  PHOS 7.8* 8.4* 10.1* 7.2* 8.5*    GFR: Estimated Creatinine Clearance: 9.8 mL/min (A) (by C-G formula based on SCr of 8.83 mg/dL (H)).  Liver Function Tests: Recent Labs  Lab 02/20/24 0446 02/22/24 0419 02/23/24 0813 02/24/24 0438 02/25/24 0502  ALBUMIN  3.2* 3.0* 3.3* 3.0* 3.2*   No results for input(s): "LIPASE", "AMYLASE" in the last 168 hours. No results for input(s): "AMMONIA" in the last 168 hours.  Urine analysis:    Component Value Date/Time   COLORURINE YELLOW 01/14/2022 0248   APPEARANCEUR HAZY (A) 01/14/2022 0248   LABSPEC 1.009 01/14/2022 0248   PHURINE 7.0 01/14/2022 0248   GLUCOSEU NEGATIVE 01/14/2022 0248   HGBUR MODERATE (A) 01/14/2022 0248   BILIRUBINUR NEGATIVE 01/14/2022 0248   KETONESUR NEGATIVE 01/14/2022 0248   PROTEINUR 100 (A) 01/14/2022 0248   NITRITE NEGATIVE 01/14/2022 0248   LEUKOCYTESUR LARGE (A) 01/14/2022 0248    Sepsis Labs: Lactic Acid, Venous    Component Value Date/Time   LATICACIDVEN 1.0 12/31/2021  0048    MICROBIOLOGY: No results found for this or any previous visit (from the past 240 hours).   RADIOLOGY STUDIES/RESULTS: No results found.    LOS: 17 days   Signature  -    Lynnwood Sauer M.D on 02/25/2024 at 9:27 AM   -  To page go to [email or web address]

## 2024-02-25 NOTE — Progress Notes (Signed)
   02/25/24 1325  Vitals  Pulse Rate (!) 101  Resp 12  BP 133/86  SpO2 100 %  O2 Device Room Air  Oxygen Therapy  Patient Activity (if Appropriate) In bed  Pulse Oximetry Type Continuous  Oximetry Probe Site Changed No  Post Treatment  Dialyzer Clearance Lightly streaked  Hemodialysis Intake (mL) 0 mL  Liters Processed 83.1  Fluid Removed (mL) 500 mL  Tolerated HD Treatment Yes  Post-Hemodialysis Comments Pt had paroxysmal SVT run 2 mins before end of treatment, pt was rinsed back. Pt had a second run of paroxysmal SVT after treatment ended NP-Anderson notified and ordered a bolus of NS. Bolus was initiated on dialysis unit and report was given to receiving RN concerning fluid administration and event. Pt said he felt slight dizziness and glucose was checked with a reading of 120. Pt given a snack.   Received patient in bed to unit.  Alert and oriented.  Informed consent signed and in chart.   TX duration: 3 hours and 28 minutes  Patient tolerated well.  Transported back to the room  Alert, without acute distress.  Hand-off given to patient's nurse.   Access used: Right HD Catheter.  Access issues: None  Medication(s) given: 750mg  vancomycin , 15mg  oxycodone .

## 2024-02-25 NOTE — Progress Notes (Signed)
 [facility] Progress Note   Subjective:    Seen and examined patient at bedside in dialysis. No issues. Requesting to run even due to cramping.   Objective Vitals:   02/25/24 0437 02/25/24 0744 02/25/24 0921 02/25/24 0935  BP: 133/80 (!) 154/92 (!) 152/87 (!) 163/79  Pulse: 75 72 73 71  Resp: 15 13 10 20   Temp: 98.4 F (36.9 C) 97.8 F (36.6 C) 97.9 F (36.6 C)   TempSrc: Oral Oral    SpO2: 98% 97% 100% 100%  Weight: 91 kg  90.4 kg   Height:       Physical Exam General: Elderly male; chronically-ill appearing; NAD; on RA Heart: S1 and S2; No murmurs, gallops, or rubs Lungs: Diminished lower bases; Clear anteriorly Abdomen: Soft and non-tender Extremities: R BKA; + L TMA wrapped Dialysis Access: [facility]   Filed Weights   02/23/24 1330 02/25/24 0437 02/25/24 0921  Weight: 91.3 kg 91 kg 90.4 kg    Intake/Output Summary (Last 24 hours) at 02/25/2024 0948 Last data filed at 02/25/2024 0600 Gross per 24 hour  Intake 480 ml  Output 200 ml  Net 280 ml    Additional Objective Labs: Basic Metabolic Panel: Recent Labs  Lab 02/23/24 0813 02/24/24 0438 02/25/24 0502  NA 139 136 137  K 6.2* 5.9* 4.9  CL 100 96* 98  CO2 20* 25 23  GLUCOSE 95 88 97  BUN 75* 48* 73*  CREATININE 9.22* 7.12* 8.83*  CALCIUM  8.7* 8.8* 8.9  PHOS 10.1* 7.2* 8.5*   Liver Function Tests: Recent Labs  Lab 02/23/24 0813 02/24/24 0438 02/25/24 0502  ALBUMIN  3.3* 3.0* 3.2*   No results for input(s): "LIPASE", "AMYLASE" in the last 168 hours. CBC: Recent Labs  Lab 02/20/24 0446 02/22/24 0419 02/23/24 0813 02/24/24 0438  WBC 6.4 5.7 7.1 6.7  NEUTROABS  --   --  5.0  --   HGB 8.5* 8.8* 9.5* 8.9*  HCT 26.6* 27.4* 29.8* 27.4*  MCV 105.6* 107.5* 106.0* 106.2*  PLT 151 165 173 172   Blood Culture    Component Value Date/Time   SDES BLOOD BLOOD RIGHT HAND AEROBIC BOTTLE ONLY 02/09/2024 0414   SPECREQUEST  02/09/2024 0414    BOTTLES DRAWN AEROBIC ONLY Blood Culture adequate  volume   CULT  02/09/2024 0414    NO GROWTH 5 DAYS Performed at [facility], [address] [place], [place] [address]    REPTSTATUS 02/14/2024 FINAL 02/09/2024 0414    Cardiac Enzymes: No results for input(s): "CKTOTAL", "CKMB", "CKMBINDEX", "TROPONINI" in the last 168 hours. CBG: Recent Labs  Lab 02/24/24 0742 02/24/24 1228 02/24/24 1647 02/24/24 2140 02/25/24 0747  GLUCAP 81 88 79 86 90   Iron  Studies: No results for input(s): "IRON ", "TIBC", "TRANSFERRIN", "FERRITIN" in the last 72 hours. Lab Results  Component Value Date   INR 1.1 02/09/2024   INR 1.1 02/08/2024   INR 1.3 (H) 01/06/2024   Studies/Results: No results found.  Medications:  sodium chloride  10 mL/hr at 02/17/24 1600   ceFEPime  (MAXIPIME ) IV 2 g (02/23/24 2121)   promethazine  (PHENERGAN ) injection (IM or IVPB)     vancomycin  Stopped (02/23/24 1349)    amLODipine   5 mg Oral Daily   vitamin C   1,000 mg Oral Daily   atorvastatin   80 mg Oral Daily   bethanechol   10 mg Oral BID   Chlorhexidine  Gluconate Cloth  6 each Topical Q0600   darbepoetin (ARANESP ) injection - DIALYSIS  100 mcg Subcutaneous Q  Sat-1800   heparin  injection (subcutaneous)  5,000 Units Subcutaneous Q8H   lactulose   30 g Oral Daily   leptospermum manuka honey  1 Application Topical Daily   levothyroxine   50 mcg Oral QAC breakfast   naloxegol  oxalate  12.5 mg Oral Daily   nutrition supplement (JUVEN)  1 packet Oral BID BM   oxyCODONE   30 mg Oral Q8H   polyethylene glycol  17 g Oral BID   senna-docusate  4 tablet Oral BID   sevelamer  carbonate  1,600 mg Oral TID WC   tamsulosin   0.4 mg Oral Daily   zinc  sulfate (50mg  elemental zinc )  220 mg Oral Daily    Dialysis Orders: TTS [facility] 4h  B400  92kg  TDC  Heparin  none - Mircera 225mcg IV q 2 weeks (last 4/9) - Calcitriol  0.33mcg PO q HD  Assessment/Plan: Thoracic discitis/OM: recent acute on chronic back pain, s/p fall on 4/20. CT imaging showed T9-10 discitis. He  refused MRI, area was too small for aspiration per IR. Earlier this year he completed an 8 week course of Dapto/Cefepime  on 3/20 for bacteremia presumed due to foot infection. Concerned that this infection may have seeded the spine. Blood cx's were negative. Per ortho pt had L TMA on 4/30. Discussed with ID pharmacy on 5/5: continue  IV Vanc 750mg  and IV Cefepime  2gm with HD x 6 weeks thru 03/23/24 for the discitis.  Hyperkalemia -renal diet, prn Lokelma  and HD to manage ESRD:  Usual TTS schedule. Next HD 5/8 - on now HTN/volume: bp's okay, at EDW.  Has some residual UOP. Continue with minimal UF today, appearing euvolemic Anemia: HGB 8-9s. Follow labs. On weekly ESA. No venofer in setting of infection. Metabolic bone disease: Ca ok, Phos high - resumed home binders. Nutrition: Alb 3.4, follow. 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  Ok for d/c from nephrology standpoint.

## 2024-02-25 NOTE — Plan of Care (Signed)
 Pt has rested quietly throughout the night with no distress noted. Alert and oriented. On room air. SR on the monitor. Voids per urinal/oliguria. Medicated for pain with relief noted. Given suppository for BM. No other complaints voiced. Did not sleep much tonight.     Problem: Tissue Perfusion: Goal: Adequacy of tissue perfusion will improve Outcome: Progressing   Problem: Education: Goal: Knowledge of General Education information will improve Description: Including pain rating scale, medication(s)/side effects and non-pharmacologic comfort measures Outcome: Progressing   Problem: Clinical Measurements: Goal: Respiratory complications will improve Outcome: Progressing Goal: Cardiovascular complication will be avoided Outcome: Progressing   Problem: Pain Managment: Goal: General experience of comfort will improve and/or be controlled Outcome: Progressing

## 2024-02-25 NOTE — Progress Notes (Signed)
 PT Cancellation Note  Patient Details Name: Thomas [name] MRN: [id number] DOB: 08-05-1951   Cancelled Treatment:    Reason Eval/Treat Not Completed: Patient at procedure or test/unavailable  Currently off unit for HD. Will check back this afternoon as schedule permits.  Jory Ng, PT, DPT [facility]  Rehabilitation Services Physical Therapist Office: [phone] Website: [email or web address]   [name] 02/25/2024, 9:42 AM

## 2024-02-25 NOTE — Plan of Care (Signed)
  Problem: Education: Goal: Ability to describe self-care measures that may prevent or decrease complications (Diabetes Survival Skills Education) will improve Outcome: Progressing Goal: Individualized Educational Video(s) Outcome: Progressing   Problem: Coping: Goal: Ability to adjust to condition or change in health will improve Outcome: Progressing   Problem: Fluid Volume: Goal: Ability to maintain a balanced intake and output will improve Outcome: Progressing   Problem: Health Behavior/Discharge Planning: Goal: Ability to identify and utilize available resources and services will improve Outcome: Progressing Goal: Ability to manage health-related needs will improve Outcome: Progressing   Problem: Metabolic: Goal: Ability to maintain appropriate glucose levels will improve Outcome: Progressing   Problem: Nutritional: Goal: Maintenance of adequate nutrition will improve Outcome: Progressing Goal: Progress toward achieving an optimal weight will improve Outcome: Progressing   Problem: Skin Integrity: Goal: Risk for impaired skin integrity will decrease Outcome: Progressing   Problem: Tissue Perfusion: Goal: Adequacy of tissue perfusion will improve Outcome: Progressing   Problem: Education: Goal: Knowledge of General Education information will improve Description: Including pain rating scale, medication(s)/side effects and non-pharmacologic comfort measures Outcome: Progressing   Problem: Health Behavior/Discharge Planning: Goal: Ability to manage health-related needs will improve Outcome: Progressing   Problem: Clinical Measurements: Goal: Ability to maintain clinical measurements within normal limits will improve Outcome: Progressing Goal: Will remain free from infection Outcome: Progressing Goal: Diagnostic test results will improve Outcome: Progressing Goal: Respiratory complications will improve Outcome: Progressing Goal: Cardiovascular complication will  be avoided Outcome: Progressing   Problem: Activity: Goal: Risk for activity intolerance will decrease Outcome: Progressing   Problem: Nutrition: Goal: Adequate nutrition will be maintained Outcome: Progressing   Problem: Coping: Goal: Level of anxiety will decrease Outcome: Progressing   Problem: Elimination: Goal: Will not experience complications related to bowel motility Outcome: Progressing Goal: Will not experience complications related to urinary retention Outcome: Progressing   Problem: Pain Managment: Goal: General experience of comfort will improve and/or be controlled Outcome: Progressing   Problem: Safety: Goal: Ability to remain free from injury will improve Outcome: Progressing   Problem: Skin Integrity: Goal: Risk for impaired skin integrity will decrease Outcome: Progressing   Problem: Education: Goal: Knowledge of the prescribed therapeutic regimen will improve Outcome: Progressing Goal: Ability to verbalize activity precautions or restrictions will improve Outcome: Progressing Goal: Understanding of discharge needs will improve Outcome: Progressing   Problem: Activity: Goal: Ability to perform//tolerate increased activity and mobilize with assistive devices will improve Outcome: Progressing   Problem: Clinical Measurements: Goal: Postoperative complications will be avoided or minimized Outcome: Progressing   Problem: Self-Care: Goal: Ability to meet self-care needs will improve Outcome: Progressing   Problem: Self-Concept: Goal: Ability to maintain and perform role responsibilities to the fullest extent possible will improve Outcome: Progressing   Problem: Pain Management: Goal: Pain level will decrease with appropriate interventions Outcome: Progressing   Problem: Skin Integrity: Goal: Demonstration of wound healing without infection will improve Outcome: Progressing

## 2024-02-26 DIAGNOSIS — M4624 Osteomyelitis of vertebra, thoracic region: Secondary | ICD-10-CM | POA: Diagnosis not present

## 2024-02-26 LAB — CBC
HCT: 26.5 % — ABNORMAL LOW (ref 39.0–52.0)
Hemoglobin: 8.5 g/dL — ABNORMAL LOW (ref 13.0–17.0)
MCH: 33.6 pg (ref 26.0–34.0)
MCHC: 32.1 g/dL (ref 30.0–36.0)
MCV: 104.7 fL — ABNORMAL HIGH (ref 80.0–100.0)
Platelets: 149 10*3/uL — ABNORMAL LOW (ref 150–400)
RBC: 2.53 MIL/uL — ABNORMAL LOW (ref 4.22–5.81)
RDW: 14.2 % (ref 11.5–15.5)
WBC: 5 10*3/uL (ref 4.0–10.5)
nRBC: 0 % (ref 0.0–0.2)

## 2024-02-26 LAB — RENAL FUNCTION PANEL
Albumin: 3 g/dL — ABNORMAL LOW (ref 3.5–5.0)
Anion gap: 14 (ref 5–15)
BUN: 43 mg/dL — ABNORMAL HIGH (ref 8–23)
CO2: 25 mmol/L (ref 22–32)
Calcium: 8.9 mg/dL (ref 8.9–10.3)
Chloride: 94 mmol/L — ABNORMAL LOW (ref 98–111)
Creatinine, Ser: 6.6 mg/dL — ABNORMAL HIGH (ref 0.61–1.24)
GFR, Estimated: 8 mL/min — ABNORMAL LOW (ref >60)
Glucose, Bld: 86 mg/dL (ref 70–99)
Phosphorus: 7.1 mg/dL — ABNORMAL HIGH (ref 2.5–4.6)
Potassium: 5 mmol/L (ref 3.5–5.1)
Sodium: 133 mmol/L — ABNORMAL LOW (ref 135–145)

## 2024-02-26 LAB — GLUCOSE, CAPILLARY
Glucose-Capillary: 144 mg/dL — ABNORMAL HIGH (ref 70–99)
Glucose-Capillary: 83 mg/dL (ref 70–99)
Glucose-Capillary: 98 mg/dL (ref 70–99)
Glucose-Capillary: 98 mg/dL (ref 70–99)

## 2024-02-26 MED ORDER — CHLORHEXIDINE GLUCONATE CLOTH 2 % EX PADS
6.0000 | MEDICATED_PAD | Freq: Every day | CUTANEOUS | Status: DC
  Administered 2024-02-27 – 2024-02-29 (×3): 6 via TOPICAL

## 2024-02-26 MED ORDER — OXYCODONE HCL 5 MG PO TABS
10.0000 mg | ORAL_TABLET | Freq: Four times a day (QID) | ORAL | Status: DC | PRN
  Administered 2024-02-26 – 2024-03-16 (×26): 10 mg via ORAL
  Filled 2024-02-26 (×24): qty 2

## 2024-02-26 NOTE — Progress Notes (Signed)
 PROGRESS NOTE        PATIENT DETAILS Name: Thomas [name] Age: 73 y.o. Sex: male Date of Birth: Jun 01, 1951 Admit Date: 02/07/2024 Admitting Physician Roxana Copier, DO UJW:JXBJYN, [name], MD  Brief Summary: Patient is a 73 y.o.  male who with history of ESRD on HD TTS, history of posterior fusion at T10-12 due to osteomyelitis/discitis/epidural abscess in 2021, history of right BKA who recently completed 6 weeks of daptomycin /cefepime  on 3/28 for left foot infection with osteomyelitis-presented with low back pain-upon further evaluation with CT imaging-he was found to have acute osteomyelitis involving his thoracic spine and admitted to the hospitalist service.  Significant events: 4/20>> admit to TRH-back pain-concern for thoracic osteomyelitis.  Significant studies: 4/21>> x-ray right elbow: No fracture. 4/21>> x-ray left wrist: No fracture 4/21>> x-ray left shoulder, no fracture. 4/21>> CT head: No acute intracranial abnormality 4/21>> CT C-spine: No fracture. 4/21>> CT abdomen/pelvis: No acute injury to chest/abdomen/pelvis 4/21>> CT T-spine: Prior posterior fusion from T10-T12-abnormal appearance with collapse of T10-lucency around T10 screws at the T9-10 disc space, T10-11 disc space concerning for discitis/osteomyelitis. 4/21>> CT L-spine: No acute bony abnormality, mild chronic compression fracture at L1. 4/22>> x-ray left foot: Chronic erosive changes about the forefoot similar to 11/21/2023-osteomyelitis/septic arthritis of the 1st/2nd MTP joints difficult to exclude. 4/25>> left ABI: Within normal limit.  Significant microbiology data: 4/22>> blood culture: No growth  Procedures: 4/30>> left transmetatarsal amputation by Dr. Julio Ohm.  Consults: Neurosurgery Infectious disease Ortho  Subjective:   Patient in bed, appears comfortable, denies any headache, no fever, no chest pain or pressure, no shortness of breath , no abdominal pain. No new  focal weakness.  Objective: Vitals: Blood pressure 115/67, pulse 63, temperature 98.1 F (36.7 C), temperature source Oral, resp. rate 13, height 6\' 5"  (1.956 m), weight 90.1 kg, SpO2 100%.   Exam:  Awake/alert, in absolutely no distress Chest: Clear to auscultation CVS: S1-S2 regular Abdomen: Soft nontender nondistended Extremities: Left TMA dressing in place, s/p chronic right AKA.   Assessment/Plan:  Acute osteomyelitis with discitis involving T9 to T11 vertebra Per neurosurgery not an operative candidate Per IR-no window to aspirate Plan is for 6 weeks of vancomycin /cefepime  with hemodialysis-EOT 03/23/24 Blood cultures negative so far Back pain better on long and short acting Oxy codon, stop IV Dilaudid , he is in no distress and always found sleeping comfortably Continue to wear TLSO brace when out of the bed. Note patient is on high doses of narcotics chronically, exhibits some narcotic seeking behavior, counseled to avoid excessive use to avoid accidental overdose, as needed Narcan  on board.  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 Chronic left second toe ulceration He just completed 6 weeks of daptomycin /cefepime  on 3/28-has refused amputation in the past.  Concern is that he may have seeded his spine through foot infection.  Subsequently evaluated by Dr. Julio Ohm on 4/28-underwent TMA on 4/30.  Toe-touch weightbearing on that leg.  Transient hypotension on 4/30 Now stable on low-dose Norvasc ,-clonidine /losartan /metoprolol  remains on hold-continue to monitor and add as appropriate.  Constipation Chronic issue-secondary to narcotics Finally had a BM after several days-remains on MiraLAX /senna-Movantik  added on 5/2. Dulcolax suppository as needed.  Mechanical fall Numerous imaging studies negative for fracture PT/OT eval  ESRD on HD TTS Nephrology following and directing HD care  Hyperkalemia on 02/20/2024 He underwent HD 02/20/2024 afternoon, 2 doses  of  Lokelma  on 02/22/2024, defer to nephrology  Prolonged QTc Twelve-lead EKG on 4/30 with normal QTc Telemetry monitoring  Normocytic anemia Secondary to ESRD Aranesp /iron  deferred to nephrology service.  Chronic HFpEF Euvolemic on exam Fluid removal with HD  HTN See above regarding hypotension BP relatively stable-see above discussion.  HLD Statin  Hypothyroidism Synthroid   BPH Flomax /bethanechol   History of right BKA  Debility/deconditioning Very reluctant to go to SNF-difficult situation-he has s/p right AKA-now left TMA several days ago-with touchdown weightbearing status.  Lives alone-minimal family assistance-his brother helps him some.  After extensive discussion-he seems a bit more accepting of the fact that he may have to go to SNF short-term.  BMI: Estimated body mass index is 23.55 kg/m as calculated from the following:   Height as of this encounter: 6\' 5"  (1.956 m).   Weight as of this encounter: 90.1 kg.   Code status:   Code Status: Full Code   DVT Prophylaxis: SCD's Start: 02/17/24 1538 heparin  injection 5,000 Units Start: 02/10/24 1400 SCDs Start: 02/08/24 2206   Family Communication: Brother-[phone] updated 02/18/24   Disposition Plan: Status is: Inpatient Remains inpatient appropriate because: Severity of illness   Planned Discharge Destination: SNF   Diet: Diet Order             Diet renal with fluid restriction Fluid restriction: 1200 mL Fluid; Room service appropriate? Yes; Fluid consistency: Thin  Diet effective now                     Antimicrobial agents: Anti-infectives (From admission, onward)    Start     Dose/Rate Route Frequency Ordered Stop   02/23/24 1800  vancomycin  (VANCOREADY) IVPB 750 mg/150 mL        750 mg 150 mL/hr over 60 Minutes Intravenous Every T-Th-Sa (Hemodialysis) 02/22/24 1432     02/23/24 0000  vancomycin  IVPB        750 mg Intravenous Every T-Th-Sa (Hemodialysis) 02/22/24 1447 03/23/24 2359    02/11/24 1800  ceFEPIme  (MAXIPIME ) 2 g in sodium chloride  0.9 % 100 mL IVPB        2 g 200 mL/hr over 30 Minutes Intravenous Every T-Th-Sa (Hemodialysis) 02/10/24 1028     02/11/24 1200  vancomycin  (VANCOCIN ) IVPB 1000 mg/200 mL premix  Status:  Discontinued        1,000 mg 200 mL/hr over 60 Minutes Intravenous Every T-Th-Sa (Hemodialysis) 02/10/24 1028 02/22/24 1432   02/11/24 0000  ceFEPime  (MAXIPIME ) IVPB        2 g Intravenous Every T-Th-Sa (Hemodialysis) 02/11/24 0747 03/23/24 2359   02/11/24 0000  vancomycin  IVPB  Status:  Discontinued        1,000 mg Intravenous Every T-Th-Sa (Hemodialysis) 02/11/24 0747 02/22/24    02/10/24 1115  vancomycin  (VANCOREADY) IVPB 2000 mg/400 mL        2,000 mg 200 mL/hr over 120 Minutes Intravenous  Once 02/10/24 1028 02/10/24 1446   02/10/24 1115  ceFEPIme  (MAXIPIME ) 2 g in sodium chloride  0.9 % 100 mL IVPB        2 g 200 mL/hr over 30 Minutes Intravenous  Once 02/10/24 1028 02/10/24 1239        MEDICATIONS: Scheduled Meds:  vitamin C   1,000 mg Oral Daily   atorvastatin   80 mg Oral Daily   bethanechol   10 mg Oral BID   Chlorhexidine  Gluconate Cloth  6 each Topical Q0600   darbepoetin (ARANESP ) injection - DIALYSIS  100 mcg Subcutaneous Q Sat-1800  heparin  injection (subcutaneous)  5,000 Units Subcutaneous Q8H   lactulose   30 g Oral Daily   leptospermum manuka honey  1 Application Topical Daily   levothyroxine   50 mcg Oral QAC breakfast   naloxegol  oxalate  12.5 mg Oral Daily   nutrition supplement (JUVEN)  1 packet Oral BID BM   oxyCODONE   30 mg Oral Q8H   polyethylene glycol  17 g Oral BID   senna-docusate  4 tablet Oral BID   sevelamer  carbonate  1,600 mg Oral TID WC   tamsulosin   0.4 mg Oral Daily   zinc  sulfate (50mg  elemental zinc )  220 mg Oral Daily   Continuous Infusions:  sodium chloride  10 mL/hr at 02/17/24 1600   ceFEPime  (MAXIPIME ) IV 2 g (02/25/24 1750)   promethazine  (PHENERGAN ) injection (IM or IVPB)     vancomycin   750 mg (02/25/24 1212)   PRN Meds:.acetaminophen  **OR** acetaminophen , bisacodyl , melatonin, naLOXone  (NARCAN )  injection, ondansetron  (ZOFRAN ) IV, oxyCODONE , promethazine  (PHENERGAN ) injection (IM or IVPB), tiZANidine    I have personally reviewed following labs and imaging studies  LABORATORY DATA: CBC: Recent Labs  Lab 02/20/24 0446 02/22/24 0419 02/23/24 0813 02/24/24 0438 02/26/24 0625  WBC 6.4 5.7 7.1 6.7 5.0  NEUTROABS  --   --  5.0  --   --   HGB 8.5* 8.8* 9.5* 8.9* 8.5*  HCT 26.6* 27.4* 29.8* 27.4* 26.5*  MCV 105.6* 107.5* 106.0* 106.2* 104.7*  PLT 151 165 173 172 149*    Basic Metabolic Panel: Recent Labs  Lab 02/22/24 0419 02/23/24 0813 02/24/24 0438 02/25/24 0502 02/26/24 0625  NA 139 139 136 137 133*  K 5.5* 6.2* 5.9* 4.9 5.0  CL 101 100 96* 98 94*  CO2 24 20* 25 23 25   GLUCOSE 113* 95 88 97 86  BUN 63* 75* 48* 73* 43*  CREATININE 7.60* 9.22* 7.12* 8.83* 6.60*  CALCIUM  8.9 8.7* 8.8* 8.9 8.9  PHOS 8.4* 10.1* 7.2* 8.5* 7.1*    GFR: Estimated Creatinine Clearance: 13.1 mL/min (A) (by C-G formula based on SCr of 6.6 mg/dL (H)).  Liver Function Tests: Recent Labs  Lab 02/22/24 0419 02/23/24 0813 02/24/24 0438 02/25/24 0502 02/26/24 0625  ALBUMIN  3.0* 3.3* 3.0* 3.2* 3.0*   No results for input(s): "LIPASE", "AMYLASE" in the last 168 hours. No results for input(s): "AMMONIA" in the last 168 hours.  Urine analysis:    Component Value Date/Time   COLORURINE YELLOW 01/14/2022 0248   APPEARANCEUR HAZY (A) 01/14/2022 0248   LABSPEC 1.009 01/14/2022 0248   PHURINE 7.0 01/14/2022 0248   GLUCOSEU NEGATIVE 01/14/2022 0248   HGBUR MODERATE (A) 01/14/2022 0248   BILIRUBINUR NEGATIVE 01/14/2022 0248   KETONESUR NEGATIVE 01/14/2022 0248   PROTEINUR 100 (A) 01/14/2022 0248   NITRITE NEGATIVE 01/14/2022 0248   LEUKOCYTESUR LARGE (A) 01/14/2022 0248    Sepsis Labs: Lactic Acid, Venous    Component Value Date/Time   LATICACIDVEN 1.0 12/31/2021 0048     MICROBIOLOGY: No results found for this or any previous visit (from the past 240 hours).   RADIOLOGY STUDIES/RESULTS: No results found.    LOS: 18 days   Signature  -    Lynnwood Sauer M.D on 02/26/2024 at 10:09 AM   -  To page go to [email or web address]

## 2024-02-26 NOTE — Progress Notes (Signed)
 [facility] Progress Note   Subjective:    Seen and examined patient at bedside. Informed of patient having a short run SVT during yesterday's HD. was removed but he then received 500ml NS bolus. He's been having issues with cramping. Next HD 5/10 and plan to run even.  Objective Vitals:   02/25/24 2359 02/26/24 0320 02/26/24 0559 02/26/24 0806  BP: 91/62 (!) 92/58  115/67  Pulse: 68 63    Resp: 10 16 13 13   Temp: 98.5 F (36.9 C) 98.5 F (36.9 C)  98.1 F (36.7 C)  TempSrc: Oral Oral  Oral  SpO2: 100% 100%  100%  Weight:   90.1 kg   Height:       Physical Exam General: Elderly male; chronically-ill appearing; NAD; on RA Heart: S1 and S2; No murmurs, gallops, or rubs Lungs: Diminished lower bases; Clear anteriorly Abdomen: Soft and non-tender Extremities: R BKA; + L TMA wrapped Dialysis Access: [facility]   Filed Weights   02/25/24 0437 02/25/24 0921 02/26/24 0559  Weight: 91 kg 90.4 kg 90.1 kg    Intake/Output Summary (Last 24 hours) at 02/26/2024 1115 Last data filed at 02/26/2024 0532 Gross per 24 hour  Intake 480 ml  Output 700 ml  Net -220 ml    Additional Objective Labs: Basic Metabolic Panel: Recent Labs  Lab 02/24/24 0438 02/25/24 0502 02/26/24 0625  NA 136 137 133*  K 5.9* 4.9 5.0  CL 96* 98 94*  CO2 25 23 25   GLUCOSE 88 97 86  BUN 48* 73* 43*  CREATININE 7.12* 8.83* 6.60*  CALCIUM  8.8* 8.9 8.9  PHOS 7.2* 8.5* 7.1*   Liver Function Tests: Recent Labs  Lab 02/24/24 0438 02/25/24 0502 02/26/24 0625  ALBUMIN  3.0* 3.2* 3.0*   No results for input(s): "LIPASE", "AMYLASE" in the last 168 hours. CBC: Recent Labs  Lab 02/20/24 0446 02/22/24 0419 02/23/24 0813 02/24/24 0438 02/26/24 0625  WBC 6.4 5.7 7.1 6.7 5.0  NEUTROABS  --   --  5.0  --   --   HGB 8.5* 8.8* 9.5* 8.9* 8.5*  HCT 26.6* 27.4* 29.8* 27.4* 26.5*  MCV 105.6* 107.5* 106.0* 106.2* 104.7*  PLT 151 165 173 172 149*   Blood Culture    Component Value Date/Time    SDES BLOOD BLOOD RIGHT HAND AEROBIC BOTTLE ONLY 02/09/2024 0414   SPECREQUEST  02/09/2024 0414    BOTTLES DRAWN AEROBIC ONLY Blood Culture adequate volume   CULT  02/09/2024 0414    NO GROWTH 5 DAYS Performed at [facility], [address], [place], [place] [address]    REPTSTATUS 02/14/2024 FINAL 02/09/2024 0414    Cardiac Enzymes: No results for input(s): "CKTOTAL", "CKMB", "CKMBINDEX", "TROPONINI" in the last 168 hours. CBG: Recent Labs  Lab 02/25/24 0747 02/25/24 1401 02/25/24 1646 02/25/24 1936 02/26/24 0805  GLUCAP 90 120* 157* 117* 144*   Iron  Studies: No results for input(s): "IRON ", "TIBC", "TRANSFERRIN", "FERRITIN" in the last 72 hours. Lab Results  Component Value Date   INR 1.1 02/09/2024   INR 1.1 02/08/2024   INR 1.3 (H) 01/06/2024   Studies/Results: No results found.  Medications:  sodium chloride  10 mL/hr at 02/17/24 1600   ceFEPime  (MAXIPIME ) IV 2 g (02/25/24 1750)   promethazine  (PHENERGAN ) injection (IM or IVPB)     vancomycin  750 mg (02/25/24 1212)    vitamin C   1,000 mg Oral Daily   atorvastatin   80 mg Oral Daily   bethanechol   10 mg Oral  BID   Chlorhexidine  Gluconate Cloth  6 each Topical Q0600   darbepoetin (ARANESP ) injection - DIALYSIS  100 mcg Subcutaneous Q Sat-1800   heparin  injection (subcutaneous)  5,000 Units Subcutaneous Q8H   lactulose   30 g Oral Daily   leptospermum manuka honey  1 Application Topical Daily   levothyroxine   50 mcg Oral QAC breakfast   naloxegol  oxalate  12.5 mg Oral Daily   nutrition supplement (JUVEN)  1 packet Oral BID BM   oxyCODONE   30 mg Oral Q8H   polyethylene glycol  17 g Oral BID   senna-docusate  4 tablet Oral BID   sevelamer  carbonate  1,600 mg Oral TID WC   tamsulosin   0.4 mg Oral Daily   zinc  sulfate (50mg  elemental zinc )  220 mg Oral Daily    Dialysis Orders: TTS [place] 4h  B400  92kg  TDC  Heparin  none - Mircera 225mcg IV q 2 weeks (last 4/9) - Calcitriol  0.32mcg PO q  HD  Assessment/Plan: Thoracic discitis/OM: recent acute on chronic back pain, s/p fall on 4/20. CT imaging showed T9-10 discitis. He refused MRI, area was too small for aspiration per IR. Earlier this year he completed an 8 week course of Dapto/Cefepime  on 3/20 for bacteremia presumed due to foot infection. Concerned that this infection may have seeded the spine. Blood cx's were negative. Per ortho pt had L TMA on 4/30. Discussed with ID pharmacy on 5/5: continue  IV Vanc 750mg  and IV Cefepime  2gm with HD x 6 weeks thru 03/23/24 for the discitis.  Hyperkalemia -renal diet, start prn Lokelma  if needed and HD to manage ESRD:  Usual TTS schedule. Next HD 5/10 HTN/volume: bp's okay, at EDW.  Has some residual UOP. He had a short run SVT during yesterday's HD. 500ml NS bolus was given. He remains euvolemic on exam. Plan to run even with HD tomorrow. Anemia: HGB 8-9s. Follow labs. On weekly ESA. No venofer in setting of infection. Metabolic bone disease: Ca ok, Phos high - resumed home binders. Nutrition: Alb 3.4, follow. 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 Dispo - Patient reports he's unable to be in the chair for HD given ongoing back pain causing SOB when in certain positions. It is required for him to be in the recliner for full treatment in order for him to continue hemodialysis in the outpatient setting. Will try HD in the chair tomorrow and see how he does. Hopeful with the position adjustments in the recliner, we can get him comfortable.  Jadene Maxwell, NP [facility] 02/26/2024,11:15 AM  LOS: 18 days

## 2024-02-26 NOTE — Plan of Care (Signed)
  Problem: Education: Goal: Ability to describe self-care measures that may prevent or decrease complications (Diabetes Survival Skills Education) will improve Outcome: Progressing Goal: Individualized Educational Video(s) Outcome: Progressing   Problem: Coping: Goal: Ability to adjust to condition or change in health will improve Outcome: Progressing   Problem: Fluid Volume: Goal: Ability to maintain a balanced intake and output will improve Outcome: Progressing   Problem: Health Behavior/Discharge Planning: Goal: Ability to identify and utilize available resources and services will improve Outcome: Progressing Goal: Ability to manage health-related needs will improve Outcome: Progressing   Problem: Metabolic: Goal: Ability to maintain appropriate glucose levels will improve Outcome: Progressing   Problem: Nutritional: Goal: Maintenance of adequate nutrition will improve Outcome: Progressing Goal: Progress toward achieving an optimal weight will improve Outcome: Progressing   Problem: Skin Integrity: Goal: Risk for impaired skin integrity will decrease Outcome: Progressing   Problem: Tissue Perfusion: Goal: Adequacy of tissue perfusion will improve Outcome: Progressing   Problem: Education: Goal: Knowledge of General Education information will improve Description: Including pain rating scale, medication(s)/side effects and non-pharmacologic comfort measures Outcome: Progressing   Problem: Health Behavior/Discharge Planning: Goal: Ability to manage health-related needs will improve Outcome: Progressing   Problem: Clinical Measurements: Goal: Ability to maintain clinical measurements within normal limits will improve Outcome: Progressing Goal: Will remain free from infection Outcome: Progressing Goal: Diagnostic test results will improve Outcome: Progressing Goal: Respiratory complications will improve Outcome: Progressing Goal: Cardiovascular complication will  be avoided Outcome: Progressing   Problem: Activity: Goal: Risk for activity intolerance will decrease Outcome: Progressing   Problem: Nutrition: Goal: Adequate nutrition will be maintained Outcome: Progressing   Problem: Coping: Goal: Level of anxiety will decrease Outcome: Progressing   Problem: Elimination: Goal: Will not experience complications related to bowel motility Outcome: Progressing Goal: Will not experience complications related to urinary retention Outcome: Progressing   Problem: Pain Managment: Goal: General experience of comfort will improve and/or be controlled Outcome: Progressing   Problem: Safety: Goal: Ability to remain free from injury will improve Outcome: Progressing   Problem: Skin Integrity: Goal: Risk for impaired skin integrity will decrease Outcome: Progressing   Problem: Education: Goal: Knowledge of the prescribed therapeutic regimen will improve Outcome: Progressing Goal: Ability to verbalize activity precautions or restrictions will improve Outcome: Progressing Goal: Understanding of discharge needs will improve Outcome: Progressing   Problem: Activity: Goal: Ability to perform//tolerate increased activity and mobilize with assistive devices will improve Outcome: Progressing   Problem: Clinical Measurements: Goal: Postoperative complications will be avoided or minimized Outcome: Progressing   Problem: Self-Care: Goal: Ability to meet self-care needs will improve Outcome: Progressing   Problem: Self-Concept: Goal: Ability to maintain and perform role responsibilities to the fullest extent possible will improve Outcome: Progressing   Problem: Pain Management: Goal: Pain level will decrease with appropriate interventions Outcome: Progressing   Problem: Skin Integrity: Goal: Demonstration of wound healing without infection will improve Outcome: Progressing

## 2024-02-26 NOTE — Progress Notes (Signed)
 Physical Therapy Treatment Patient Details Name: Thomas [name] MRN: [id number] DOB: 1951-07-24 Today's Date: 02/26/2024   History of Present Illness The pt is a 73 yo male presenting 4/20 with back pain after a fall at home. 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 PMH includes: ESRD on HD TTS, prior thoracic spine fusion, spinal 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-T12, chronic thoracic osteomyelitis, chronic pain syndrome, gout, PE not on anticoagulation, DM-2, right BKA, left second toe ulcer and HTN    PT Comments  Agreeable to work after he received pain medication. Able to sit EOB majority of session, unsupported. Required some short periods of support on elevated HOB as he fatigued. Able to perform LE exercises EOB. Refused transfer training or donning TLSO. Patient will continue to benefit from skilled physical therapy services to further improve independence with functional mobility.     If plan is discharge home, recommend the following: Two people to help with walking and/or transfers;Two people to help with bathing/dressing/bathroom;Assistance with cooking/housework;Assistance with feeding;Direct supervision/assist for medications management;Direct supervision/assist for financial management;Assist for transportation;Help with stairs or ramp for entrance   Can travel by private vehicle     No  Equipment Recommendations  Hospital bed (States w/c will not fit in home)    Recommendations for Other Services       Precautions / Restrictions Precautions Precautions: Back;Fall (back for comfort) Precaution Booklet Issued: Yes (comment) Recall of Precautions/Restrictions: Intact Precaution/Restrictions Comments:  (educated on spinal precautions for comfort, no formal back precautions ordered. WB precautions TDWB Lt foot) Required Braces or Orthoses: Spinal Brace;Other Brace Spinal Brace: Thoracolumbosacral orthotic;Applied in sitting position Other  Brace: Post op shoe Restrictions Weight Bearing Restrictions Per Provider Order: Yes LLE Weight Bearing Per Provider Order: Touchdown weight bearing Other Position/Activity Restrictions: Post op shoe     Mobility  Bed Mobility Overal bed mobility: Needs Assistance Bed Mobility: Supine to Sit, Sit to Supine     Supine to sit: Used rails, Contact guard, HOB elevated Sit to supine: Contact guard assist, Used rails, HOB elevated   General bed mobility comments: Educated on log roll but does not comply completely. Cues for rail use and sidelying position as tolerated. Able to pull self to sitting EOB without physical assist. CGA for safety. Good LE strength to lift back into bed.    Transfers                   General transfer comment: refused    Ambulation/Gait                   Stairs             Wheelchair Mobility     Tilt Bed    Modified Rankin (Stroke Patients Only)       Balance Overall balance assessment: History of Falls, Needs assistance Sitting-balance support: No upper extremity supported Sitting balance-[name]: Fair Sitting balance - Comments: Sits EOB unsupported, supervision for safety.                                    Communication Communication Communication: No apparent difficulties  Cognition Arousal: Alert Behavior During Therapy: WFL for tasks assessed/performed   PT - Cognitive impairments: No family/caregiver present to determine baseline                       PT -  Cognition Comments: Self limiting. Educated on risks of immobility. Following commands: Intact      Cueing Cueing Techniques: Verbal cues  Exercises General Exercises - Lower Extremity Long Arc Quad: Strengthening, Both, 10 reps, Seated Hip ABduction/ADduction: Both, 10 reps, Strengthening, Seated Other Exercises Other Exercises: EOB x 10 min working on trunk control. Fatigues and leans onto elevated HOB as needed.     General Comments        Pertinent Vitals/Pain Pain Assessment Pain Assessment: Faces Faces Pain Scale: Hurts little more Pain Location: neck/back Pain Descriptors / Indicators: Aching Pain Intervention(s): Monitored during session, Limited activity within patient's tolerance, Premedicated before session, Repositioned    Home Living                          Prior Function            PT Goals (current goals can now be found in the care plan section) Acute Rehab PT Goals Patient Stated Goal: to reduce pain PT Goal Formulation: With patient Time For Goal Achievement: 03/03/24 Potential to Achieve Goals: Fair Progress towards PT goals: Progressing toward goals    Frequency    Min 2X/week      PT Plan      Co-evaluation              AM-PAC PT "6 Clicks" Mobility   Outcome Measure  Help needed turning from your back to your side while in a flat bed without using bedrails?: A Little Help needed moving from lying on your back to sitting on the side of a flat bed without using bedrails?: A Little Help needed moving to and from a bed to a chair (including a wheelchair)?: Total Help needed standing up from a chair using your arms (e.g., wheelchair or bedside chair)?: Total Help needed to walk in hospital room?: Total Help needed climbing 3-5 steps with a railing? : Total 6 Click Score: 10    End of Session   Activity Tolerance: Other (comment) (Self limiting behaviors) Patient left: in bed;with call bell/phone within reach;with bed alarm set Nurse Communication: Mobility status PT Visit Diagnosis: Unsteadiness on feet (R26.81);Other abnormalities of gait and mobility (R26.89);Repeated falls (R29.6);Muscle weakness (generalized) (M62.81);Pain;Difficulty in walking, not elsewhere classified (R26.2);History of falling (Z91.81) Pain - part of body:  (back)     Time: 2956-2130 PT Time Calculation (min) (ACUTE ONLY): 22 min  Charges:    $Therapeutic  Activity: 8-22 mins PT General Charges $$ ACUTE PT VISIT: 1 Visit                     Jory Ng, PT, DPT [facility]  Rehabilitation Services Physical Therapist Office: [phone] Website: [email or web address]    [name] 02/26/2024, 2:00 PM

## 2024-02-26 NOTE — Plan of Care (Signed)
 Pt has rested quietly throughout the night with no distress noted. Alert and oriented. ON room air. SR on the monitor. Voids per urinal/oliguria. Medicated twice for pain with relief noted. No other complaints voiced.     Problem: Metabolic: Goal: Ability to maintain appropriate glucose levels will improve Outcome: Progressing   Problem: Tissue Perfusion: Goal: Adequacy of tissue perfusion will improve Outcome: Progressing   Problem: Clinical Measurements: Goal: Respiratory complications will improve Outcome: Progressing Goal: Cardiovascular complication will be avoided Outcome: Progressing   Problem: Coping: Goal: Level of anxiety will decrease Outcome: Progressing   Problem: Pain Managment: Goal: General experience of comfort will improve and/or be controlled Outcome: Progressing

## 2024-02-26 NOTE — Progress Notes (Signed)
 PT Cancellation Note  Patient Details Name: Thomas [name] MRN: [id number] DOB: 28-Jun-1951   Cancelled Treatment:    Reason Eval/Treat Not Completed: Patient declined, no reason specified  Pt awakened, sleeping comfortably, no apparent distress but declines to attempt working with PT until he receives pain medication. Secure chat send to RN. Will try to coordinate follow-up ~30 minutes after meds. States he will try his best.  Jory Ng, PT, DPT [facility]  Rehabilitation Services Physical Therapist Office: [phone] Website: [email or web address]   [name] 02/26/2024, 11:52 AM

## 2024-02-27 DIAGNOSIS — M4624 Osteomyelitis of vertebra, thoracic region: Secondary | ICD-10-CM | POA: Diagnosis not present

## 2024-02-27 LAB — CBC WITH DIFFERENTIAL/PLATELET
Abs Immature Granulocytes: 0.02 10*3/uL (ref 0.00–0.07)
Basophils Absolute: 0 10*3/uL (ref 0.0–0.1)
Basophils Relative: 1 %
Eosinophils Absolute: 0.3 10*3/uL (ref 0.0–0.5)
Eosinophils Relative: 5 %
HCT: 30.6 % — ABNORMAL LOW (ref 39.0–52.0)
Hemoglobin: 9.7 g/dL — ABNORMAL LOW (ref 13.0–17.0)
Immature Granulocytes: 0 %
Lymphocytes Relative: 23 %
Lymphs Abs: 1.3 10*3/uL (ref 0.7–4.0)
MCH: 34 pg (ref 26.0–34.0)
MCHC: 31.7 g/dL (ref 30.0–36.0)
MCV: 107.4 fL — ABNORMAL HIGH (ref 80.0–100.0)
Monocytes Absolute: 0.8 10*3/uL (ref 0.1–1.0)
Monocytes Relative: 14 %
Neutro Abs: 3.3 10*3/uL (ref 1.7–7.7)
Neutrophils Relative %: 57 %
Platelets: 160 10*3/uL (ref 150–400)
RBC: 2.85 MIL/uL — ABNORMAL LOW (ref 4.22–5.81)
RDW: 13.8 % (ref 11.5–15.5)
WBC: 5.7 10*3/uL (ref 4.0–10.5)
nRBC: 0 % (ref 0.0–0.2)

## 2024-02-27 LAB — RENAL FUNCTION PANEL
Albumin: 3.3 g/dL — ABNORMAL LOW (ref 3.5–5.0)
Anion gap: 19 — ABNORMAL HIGH (ref 5–15)
BUN: 61 mg/dL — ABNORMAL HIGH (ref 8–23)
CO2: 22 mmol/L (ref 22–32)
Calcium: 9.4 mg/dL (ref 8.9–10.3)
Chloride: 99 mmol/L (ref 98–111)
Creatinine, Ser: 8.73 mg/dL — ABNORMAL HIGH (ref 0.61–1.24)
GFR, Estimated: 6 mL/min — ABNORMAL LOW (ref >60)
Glucose, Bld: 88 mg/dL (ref 70–99)
Phosphorus: 7.9 mg/dL — ABNORMAL HIGH (ref 2.5–4.6)
Potassium: 4.9 mmol/L (ref 3.5–5.1)
Sodium: 140 mmol/L (ref 135–145)

## 2024-02-27 LAB — GLUCOSE, CAPILLARY
Glucose-Capillary: 92 mg/dL (ref 70–99)
Glucose-Capillary: 162 mg/dL — ABNORMAL HIGH (ref 70–99)
Glucose-Capillary: 135 mg/dL — ABNORMAL HIGH (ref 70–99)
Glucose-Capillary: 130 mg/dL — ABNORMAL HIGH (ref 70–99)

## 2024-02-27 MED ORDER — ALTEPLASE 2 MG IJ SOLR: 2.0000 mg | Freq: Once | INTRAMUSCULAR | Status: DC | PRN

## 2024-02-27 MED ORDER — ONDANSETRON HCL 4 MG/2ML IJ SOLN
INTRAMUSCULAR | Status: AC
  Filled 2024-02-27: qty 2

## 2024-02-27 MED ORDER — VANCOMYCIN HCL 750 MG/150ML IV SOLN
INTRAVENOUS | Status: AC
Start: 2024-02-27 — End: ?
  Filled 2024-02-27: qty 150

## 2024-02-27 MED ORDER — LIDOCAINE HCL (PF) 1 % IJ SOLN: 5.0000 mL | INTRAMUSCULAR | Status: DC | PRN

## 2024-02-27 MED ORDER — PENTAFLUOROPROP-TETRAFLUOROETH EX AERO: 1.0000 | INHALATION_SPRAY | CUTANEOUS | Status: DC | PRN

## 2024-02-27 MED ORDER — OXYCODONE HCL 5 MG PO TABS
ORAL_TABLET | ORAL | Status: AC
  Filled 2024-02-27: qty 1

## 2024-02-27 MED ORDER — HEPARIN SODIUM (PORCINE) 1000 UNIT/ML IJ SOLN
3200.0000 [IU] | Freq: Once | INTRAMUSCULAR | Status: AC
  Administered 2024-02-27: 3200 [IU]

## 2024-02-27 MED ORDER — HEPARIN SODIUM (PORCINE) 1000 UNIT/ML IJ SOLN
INTRAMUSCULAR | Status: AC
Start: 2024-02-27 — End: ?
  Filled 2024-02-27: qty 4

## 2024-02-27 MED ORDER — OXYCODONE-ACETAMINOPHEN 5-325 MG PO TABS
ORAL_TABLET | ORAL | Status: AC
  Filled 2024-02-27: qty 1

## 2024-02-27 NOTE — Progress Notes (Signed)
 Cranberry juice and egg/sausage sandwich given for his breakfast.

## 2024-02-27 NOTE — Progress Notes (Signed)
 PROGRESS NOTE        PATIENT DETAILS Name: Thomas [name] Age: 73 y.o. Sex: male Date of Birth: 1950-12-13 Admit Date: 02/07/2024 Admitting Physician Roxana Copier, DO ZOX:WRUEAV, [name], MD  Brief Summary: Patient is a 73 y.o.  male who with history of ESRD on HD TTS, history of posterior fusion at T10-12 due to osteomyelitis/discitis/epidural abscess in 2021, history of right BKA who recently completed 6 weeks of daptomycin /cefepime  on 3/28 for left foot infection with osteomyelitis-presented with low back pain-upon further evaluation with CT imaging-he was found to have acute osteomyelitis involving his thoracic spine and admitted to the hospitalist service.  Significant events: 4/20>> admit to [facility]-back pain-concern for thoracic osteomyelitis.  Significant studies: 4/21>> x-ray right elbow: No fracture. 4/21>> x-ray left wrist: No fracture 4/21>> x-ray left shoulder, no fracture. 4/21>> CT head: No acute intracranial abnormality 4/21>> CT C-spine: No fracture. 4/21>> CT abdomen/pelvis: No acute injury to chest/abdomen/pelvis 4/21>> CT T-spine: Prior posterior fusion from T10-T12-abnormal appearance with collapse of T10-lucency around T10 screws at the T9-10 disc space, T10-11 disc space concerning for discitis/osteomyelitis. 4/21>> CT L-spine: No acute bony abnormality, mild chronic compression fracture at L1. 4/22>> x-ray left foot: Chronic erosive changes about the forefoot similar to 11/21/2023-osteomyelitis/septic arthritis of the 1st/2nd MTP joints difficult to exclude. 4/25>> left ABI: Within normal limit.  Significant microbiology data: 4/22>> blood culture: No growth  Procedures: 4/30>> left transmetatarsal amputation by Dr. Julio Ohm.  Consults: Neurosurgery Infectious disease Ortho  Subjective:  Patient in bed, appears comfortable, denies any headache, no fever, no chest pain or pressure, no shortness of breath , no abdominal pain. No new  focal weakness.    Objective: Vitals: Blood pressure 127/81, pulse 65, temperature (!) 97.4 F (36.3 C), temperature source Oral, resp. rate 14, height 6\' 5"  (1.956 m), weight 84.3 kg, SpO2 100%.   Exam:  Awake/alert, in absolutely no distress Chest: Clear to auscultation CVS: S1-S2 regular Abdomen: Soft nontender nondistended Extremities: Left TMA dressing in place, s/p chronic right AKA.   Assessment/Plan:  Acute osteomyelitis with discitis involving T9 to T11 vertebra Per neurosurgery not an operative candidate Per IR-no window to aspirate Plan is for 6 weeks of vancomycin /cefepime  with hemodialysis-EOT 03/23/24 Blood cultures negative so far Back pain better on long and short acting Oxy codon, stop IV Dilaudid , he is in no distress and always found sleeping comfortably Continue to wear TLSO brace when out of the bed. Note patient is on high doses of narcotics chronically, exhibits some narcotic seeking behavior, counseled to avoid excessive use to avoid accidental overdose, as needed Narcan  on board.  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 Chronic left second toe ulceration He just completed 6 weeks of daptomycin /cefepime  on 3/28-has refused amputation in the past.  Concern is that he may have seeded his spine through foot infection.  Subsequently evaluated by Dr. Julio Ohm on 4/28-underwent TMA on 4/30.  Toe-touch weightbearing on that leg.  Transient hypotension on 4/30 Now stable on low-dose Norvasc ,-clonidine /losartan /metoprolol  remains on hold-continue to monitor and add as appropriate.  Constipation Chronic issue-secondary to narcotics Finally had a BM after several days-remains on MiraLAX /senna-Movantik  added on 5/2. Dulcolax suppository as needed.  Mechanical fall Numerous imaging studies negative for fracture PT/OT eval  ESRD on HD TTS Nephrology following and directing HD care  Hyperkalemia on 02/20/2024 He underwent HD 02/20/2024 afternoon,  2  doses of Lokelma  on 02/22/2024, defer to nephrology  Prolonged QTc Twelve-lead EKG on 4/30 with normal QTc Telemetry monitoring  Normocytic anemia Secondary to ESRD Aranesp /iron  deferred to nephrology service.  Chronic HFpEF Euvolemic on exam Fluid removal with HD  HTN See above regarding hypotension BP relatively stable-see above discussion.  HLD Statin  Hypothyroidism Synthroid   BPH Flomax /bethanechol   History of right BKA  Debility/deconditioning Very reluctant to go to SNF-difficult situation-he has s/p right AKA-now left TMA several days ago-with touchdown weightbearing status.  Lives alone-minimal family assistance-his brother helps him some.  After extensive discussion-he seems a bit more accepting of the fact that he may have to go to SNF short-term.  BMI: Estimated body mass index is 22.04 kg/m as calculated from the following:   Height as of this encounter: 6\' 5"  (1.956 m).   Weight as of this encounter: 84.3 kg.   Code status:   Code Status: Full Code   DVT Prophylaxis: SCD's Start: 02/17/24 1538 heparin  injection 5,000 Units Start: 02/10/24 1400 SCDs Start: 02/08/24 2206   Family Communication: Brother-[id number] updated 02/18/24   Disposition Plan: Status is: Inpatient Remains inpatient appropriate because: Severity of illness   Planned Discharge Destination: SNF   Diet: Diet Order             Diet renal with fluid restriction Fluid restriction: 1200 mL Fluid; Room service appropriate? Yes; Fluid consistency: Thin  Diet effective now                     Antimicrobial agents: Anti-infectives (From admission, onward)    Start     Dose/Rate Route Frequency Ordered Stop   02/23/24 1800  vancomycin  (VANCOREADY) IVPB 750 mg/150 mL        750 mg 150 mL/hr over 60 Minutes Intravenous Every T-Th-Sa (Hemodialysis) 02/22/24 1432     02/23/24 0000  vancomycin  IVPB        750 mg Intravenous Every T-Th-Sa (Hemodialysis) 02/22/24 1447  03/23/24 2359   02/11/24 1800  ceFEPIme  (MAXIPIME ) 2 g in sodium chloride  0.9 % 100 mL IVPB        2 g 200 mL/hr over 30 Minutes Intravenous Every T-Th-Sa (Hemodialysis) 02/10/24 1028     02/11/24 1200  vancomycin  (VANCOCIN ) IVPB 1000 mg/200 mL premix  Status:  Discontinued        1,000 mg 200 mL/hr over 60 Minutes Intravenous Every T-Th-Sa (Hemodialysis) 02/10/24 1028 02/22/24 1432   02/11/24 0000  ceFEPime  (MAXIPIME ) IVPB        2 g Intravenous Every T-Th-Sa (Hemodialysis) 02/11/24 0747 03/23/24 2359   02/11/24 0000  vancomycin  IVPB  Status:  Discontinued        1,000 mg Intravenous Every T-Th-Sa (Hemodialysis) 02/11/24 0747 02/22/24    02/10/24 1115  vancomycin  (VANCOREADY) IVPB 2000 mg/400 mL        2,000 mg 200 mL/hr over 120 Minutes Intravenous  Once 02/10/24 1028 02/10/24 1446   02/10/24 1115  ceFEPIme  (MAXIPIME ) 2 g in sodium chloride  0.9 % 100 mL IVPB        2 g 200 mL/hr over 30 Minutes Intravenous  Once 02/10/24 1028 02/10/24 1239        MEDICATIONS: Scheduled Meds:  vitamin C   1,000 mg Oral Daily   atorvastatin   80 mg Oral Daily   bethanechol   10 mg Oral BID   Chlorhexidine  Gluconate Cloth  6 each Topical Q0600   darbepoetin (ARANESP ) injection - DIALYSIS  100 mcg Subcutaneous Q  Sat-1800   heparin  injection (subcutaneous)  5,000 Units Subcutaneous Q8H   lactulose   30 g Oral Daily   leptospermum manuka honey  1 Application Topical Daily   levothyroxine   50 mcg Oral QAC breakfast   naloxegol  oxalate  12.5 mg Oral Daily   nutrition supplement (JUVEN)  1 packet Oral BID BM   oxyCODONE   30 mg Oral Q8H   polyethylene glycol  17 g Oral BID   senna-docusate  4 tablet Oral BID   sevelamer  carbonate  1,600 mg Oral TID WC   tamsulosin   0.4 mg Oral Daily   zinc  sulfate (50mg  elemental zinc )  220 mg Oral Daily   Continuous Infusions:  sodium chloride  10 mL/hr at 02/17/24 1600   ceFEPime  (MAXIPIME ) IV 2 g (02/25/24 1750)   promethazine  (PHENERGAN ) injection (IM or IVPB)      vancomycin  750 mg (02/25/24 1212)   PRN Meds:.acetaminophen  **OR** acetaminophen , alteplase , bisacodyl , lidocaine  (PF), melatonin, naLOXone  (NARCAN )  injection, ondansetron  (ZOFRAN ) IV, oxyCODONE , pentafluoroprop-tetrafluoroeth, promethazine  (PHENERGAN ) injection (IM or IVPB), tiZANidine    I have personally reviewed following labs and imaging studies  LABORATORY DATA: CBC: Recent Labs  Lab 02/22/24 0419 02/23/24 0813 02/24/24 0438 02/26/24 0625 02/27/24 0642  WBC 5.7 7.1 6.7 5.0 5.7  NEUTROABS  --  5.0  --   --  3.3  HGB 8.8* 9.5* 8.9* 8.5* 9.7*  HCT 27.4* 29.8* 27.4* 26.5* 30.6*  MCV 107.5* 106.0* 106.2* 104.7* 107.4*  PLT 165 173 172 149* 160    Basic Metabolic Panel: Recent Labs  Lab 02/23/24 0813 02/24/24 0438 02/25/24 0502 02/26/24 0625 02/27/24 0642  NA 139 136 137 133* 140  K 6.2* 5.9* 4.9 5.0 4.9  CL 100 96* 98 94* 99  CO2 20* 25 23 25 22   GLUCOSE 95 88 97 86 88  BUN 75* 48* 73* 43* 61*  CREATININE 9.22* 7.12* 8.83* 6.60* 8.73*  CALCIUM  8.7* 8.8* 8.9 8.9 9.4  PHOS 10.1* 7.2* 8.5* 7.1* 7.9*    GFR: Estimated Creatinine Clearance: 9.4 mL/min (A) (by C-G formula based on SCr of 8.73 mg/dL (H)).  Liver Function Tests: Recent Labs  Lab 02/23/24 0813 02/24/24 0438 02/25/24 0502 02/26/24 1610 02/27/24 9604  ALBUMIN  3.3* 3.0* 3.2* 3.0* 3.3*   No results for input(s): "LIPASE", "AMYLASE" in the last 168 hours. No results for input(s): "AMMONIA" in the last 168 hours.  Urine analysis:    Component Value Date/Time   COLORURINE YELLOW 01/14/2022 0248   APPEARANCEUR HAZY (A) 01/14/2022 0248   LABSPEC 1.009 01/14/2022 0248   PHURINE 7.0 01/14/2022 0248   GLUCOSEU NEGATIVE 01/14/2022 0248   HGBUR MODERATE (A) 01/14/2022 0248   BILIRUBINUR NEGATIVE 01/14/2022 0248   KETONESUR NEGATIVE 01/14/2022 0248   PROTEINUR 100 (A) 01/14/2022 0248   NITRITE NEGATIVE 01/14/2022 0248   LEUKOCYTESUR LARGE (A) 01/14/2022 0248    Sepsis Labs: Lactic Acid,  Venous    Component Value Date/Time   LATICACIDVEN 1.0 12/31/2021 0048    MICROBIOLOGY: No results found for this or any previous visit (from the past 240 hours).   RADIOLOGY STUDIES/RESULTS: No results found.    LOS: 19 days   Signature  -    Lynnwood Sauer M.D on 02/27/2024 at 9:18 AM   -  To page go to [email or web address]

## 2024-02-27 NOTE — Plan of Care (Signed)
 Pt has rested quietly throughout the night with no distress noted. Alert and oriented. ON room air. SR on the monitor. Voids  per urinal/ oliguria. Meidicated for pain with extra oxy once with relief noted and given scheduled oxy with relief noted. Given zanaflex  with relief noted. No other complaints voiced.     Problem: Education: Goal: Knowledge of General Education information will improve Description: Including pain rating scale, medication(s)/side effects and non-pharmacologic comfort measures Outcome: Progressing   Problem: Clinical Measurements: Goal: Respiratory complications will improve Outcome: Progressing Goal: Cardiovascular complication will be avoided Outcome: Progressing   Problem: Pain Managment: Goal: General experience of comfort will improve and/or be controlled Outcome: Progressing

## 2024-02-27 NOTE — Progress Notes (Signed)
 Received patient in recliner,awake,alert and oriented x 4.Consent verified.  Access used: Right hd catheter that worked well.Dressing on date.  Uf goal: Even as ordered.Tolerated treatment.  Medicines given : Vancomycin  750mg = 200 cc.                               Maxipime  2 g = 150 cc                               Zofran  4 mg.  Hand off to the patient's nurse : Back into his room via transporter with stable condition.

## 2024-02-27 NOTE — Progress Notes (Signed)
 [facility] Progress Note   Subjective:   Seen in [facility]- on dialysis. Sitting up in bed. Says he is cramping. No UF with HD today.   Objective Vitals:   02/26/24 2336 02/27/24 0742 02/27/24 0843 02/27/24 0846  BP: 112/74 134/84 127/81   Pulse: 75 65    Resp: 14 16 14    Temp: 98 F (36.7 C) 97.6 F (36.4 C) (!) 97.4 F (36.3 C)   TempSrc: Oral Oral Oral   SpO2: 100%  100%   Weight:    84.3 kg  Height:       Physical Exam General: Elderly male; chronically-ill appearing; NAD; on RA Heart: S1 and S2; No murmurs, gallops, or rubs Lungs: Diminished lower bases; Clear anteriorly Abdomen: Soft, non-tender Extremities: R BKA; + L TMA wrapped Dialysis Access: [facility]   Filed Weights   02/25/24 0921 02/26/24 0559 02/27/24 0846  Weight: 90.4 kg 90.1 kg 84.3 kg    Intake/Output Summary (Last 24 hours) at 02/27/2024 0950 Last data filed at 02/27/2024 0551 Gross per 24 hour  Intake 720 ml  Output 400 ml  Net 320 ml    Additional Objective Labs: Basic Metabolic Panel: Recent Labs  Lab 02/25/24 0502 02/26/24 0625 02/27/24 0642  NA 137 133* 140  K 4.9 5.0 4.9  CL 98 94* 99  CO2 23 25 22   GLUCOSE 97 86 88  BUN 73* 43* 61*  CREATININE 8.83* 6.60* 8.73*  CALCIUM  8.9 8.9 9.4  PHOS 8.5* 7.1* 7.9*   Liver Function Tests: Recent Labs  Lab 02/25/24 0502 02/26/24 0625 02/27/24 0642  ALBUMIN  3.2* 3.0* 3.3*   No results for input(s): "LIPASE", "AMYLASE" in the last 168 hours. CBC: Recent Labs  Lab 02/22/24 0419 02/23/24 0813 02/24/24 0438 02/26/24 0625 02/27/24 0642  WBC 5.7 7.1 6.7 5.0 5.7  NEUTROABS  --  5.0  --   --  3.3  HGB 8.8* 9.5* 8.9* 8.5* 9.7*  HCT 27.4* 29.8* 27.4* 26.5* 30.6*  MCV 107.5* 106.0* 106.2* 104.7* 107.4*  PLT 165 173 172 149* 160   Blood Culture    Component Value Date/Time   SDES BLOOD BLOOD RIGHT HAND AEROBIC BOTTLE ONLY 02/09/2024 0414   SPECREQUEST  02/09/2024 0414    BOTTLES DRAWN AEROBIC ONLY Blood Culture adequate  volume   CULT  02/09/2024 0414    NO GROWTH 5 DAYS Performed at [facility], [address], [place], [place] [address]    REPTSTATUS 02/14/2024 FINAL 02/09/2024 0414    Cardiac Enzymes: No results for input(s): "CKTOTAL", "CKMB", "CKMBINDEX", "TROPONINI" in the last 168 hours. CBG: Recent Labs  Lab 02/26/24 0805 02/26/24 1142 02/26/24 1639 02/26/24 2122 02/27/24 0742  GLUCAP 144* 83 98 98 92   Iron  Studies: No results for input(s): "IRON ", "TIBC", "TRANSFERRIN", "FERRITIN" in the last 72 hours. Lab Results  Component Value Date   INR 1.1 02/09/2024   INR 1.1 02/08/2024   INR 1.3 (H) 01/06/2024   Studies/Results: No results found.  Medications:  sodium chloride  10 mL/hr at 02/17/24 1600   ceFEPime  (MAXIPIME ) IV 2 g (02/25/24 1750)   promethazine  (PHENERGAN ) injection (IM or IVPB)     vancomycin  750 mg (02/25/24 1212)    vitamin C   1,000 mg Oral Daily   atorvastatin   80 mg Oral Daily   bethanechol   10 mg Oral BID   Chlorhexidine  Gluconate Cloth  6 each Topical Q0600   darbepoetin (ARANESP ) injection - DIALYSIS  100 mcg Subcutaneous Q Sat-1800  heparin  injection (subcutaneous)  5,000 Units Subcutaneous Q8H   lactulose   30 g Oral Daily   leptospermum manuka honey  1 Application Topical Daily   levothyroxine   50 mcg Oral QAC breakfast   naloxegol  oxalate  12.5 mg Oral Daily   nutrition supplement (JUVEN)  1 packet Oral BID BM   oxyCODONE   30 mg Oral Q8H   polyethylene glycol  17 g Oral BID   senna-docusate  4 tablet Oral BID   sevelamer  carbonate  1,600 mg Oral TID WC   tamsulosin   0.4 mg Oral Daily   zinc  sulfate (50mg  elemental zinc )  220 mg Oral Daily    Dialysis Orders: TTS [place] 4h  B400  92kg  TDC  Heparin  none - Mircera 225mcg IV q 2 weeks (last 4/9) - Calcitriol  0.8mcg PO q HD  Assessment/Plan: 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 CT imaging showed T9-10 discitis. He refused MRI, area was too small for aspiration per IR.  H/o of foot infection as below. Concerned that this infection may have seeded the spine. Blood Cxs were negative. Not an operative candidate per neurosurgery.  Per ID continue IV Vanc 750mg  and IV Cefepime  2gm with HD x 6 weeks thru 03/23/24 for the discitis.  Left foot osteomyelitis - completed an 8 week course of Dapto/Cefepime  on 3/28 for bacteremia presumed due to foot infection. S/p left TMA on 4/30. Antibiotics as above.  Hyperkalemia -renal diet. Improved.  ESRD:  Usual TTS schedule. Next HD 5/10 HTN/volume: BPs okay, at EDW.  Has some residual UOP. Minimal UF, appears euvolemic.  Anemia: HGB 8-9s. Follow labs. On weekly ESA. No venofer in setting of infection. Metabolic bone disease: Ca ok, Phos high - resumed home binders. 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 Dispo - For SNF placement.   Elona Hal PA-C [facility] 02/27/2024,9:50 AM

## 2024-02-28 DIAGNOSIS — M4624 Osteomyelitis of vertebra, thoracic region: Secondary | ICD-10-CM | POA: Diagnosis not present

## 2024-02-28 LAB — GLUCOSE, CAPILLARY
Glucose-Capillary: 80 mg/dL (ref 70–99)
Glucose-Capillary: 92 mg/dL (ref 70–99)
Glucose-Capillary: 110 mg/dL — ABNORMAL HIGH (ref 70–99)
Glucose-Capillary: 187 mg/dL — ABNORMAL HIGH (ref 70–99)

## 2024-02-28 NOTE — Progress Notes (Signed)
 PROGRESS NOTE        PATIENT DETAILS Name: Thomas [name] Age: 73 y.o. Sex: male Date of Birth: Dec 23, 1950 Admit Date: 02/07/2024 Admitting Physician Thomas Copier, DO ZOX:WRUEAV, [name], MD  Brief Summary: Patient is a 73 y.o.  male who with history of ESRD on HD TTS, history of posterior fusion at T10-12 due to osteomyelitis/discitis/epidural abscess in 2021, history of right BKA who recently completed 6 weeks of daptomycin /cefepime  on 3/28 for left foot infection with osteomyelitis-presented with low back pain-upon further evaluation with CT imaging-he was found to have acute osteomyelitis involving his thoracic spine and admitted to the hospitalist service.  Significant events: 4/20>> admit to TRH-back pain-concern for thoracic osteomyelitis.  Significant studies: 4/21>> x-ray right elbow: No fracture. 4/21>> x-ray left wrist: No fracture 4/21>> x-ray left shoulder, no fracture. 4/21>> CT head: No acute intracranial abnormality 4/21>> CT C-spine: No fracture. 4/21>> CT abdomen/pelvis: No acute injury to chest/abdomen/pelvis 4/21>> CT T-spine: Prior posterior fusion from T10-T12-abnormal appearance with collapse of T10-lucency around T10 screws at the T9-10 disc space, T10-11 disc space concerning for discitis/osteomyelitis. 4/21>> CT L-spine: No acute bony abnormality, mild chronic compression fracture at L1. 4/22>> x-ray left foot: Chronic erosive changes about the forefoot similar to 11/21/2023-osteomyelitis/septic arthritis of the 1st/2nd MTP joints difficult to exclude. 4/25>> left ABI: Within normal limit.  Significant microbiology data: 4/22>> blood culture: No growth  Procedures: 4/30>> left transmetatarsal amputation by Dr. Julio [name].  Consults: Neurosurgery Infectious disease Ortho  Subjective:  Patient in bed, appears comfortable, denies any headache, no fever, no chest pain or pressure, no shortness of breath , no abdominal pain. No new  focal weakness.  Objective: Vitals: Blood pressure 122/80, pulse 75, temperature 98.1 F (36.7 C), temperature source Oral, resp. rate 15, height 6\' 5"  (1.956 m), weight 90.4 kg, SpO2 100%.   Exam:  Awake/alert, in absolutely no distress Chest: Clear to auscultation CVS: S1-S2 regular Abdomen: Soft nontender nondistended Extremities: Left TMA dressing in place, s/p chronic right AKA.   Assessment/Plan:  Acute osteomyelitis with discitis involving T9 to T11 vertebra Per neurosurgery not an operative candidate Per IR-no window to aspirate Plan is for 6 weeks of vancomycin /cefepime  with hemodialysis-EOT 03/23/24 Blood cultures negative so far Back pain better on long and short acting Oxy codon, stop IV Dilaudid , he is in no distress and always found sleeping comfortably Continue to wear TLSO brace when out of the bed. Note patient is on high doses of narcotics chronically, exhibits some narcotic seeking behavior, counseled to avoid excessive use to avoid accidental overdose, as needed Narcan  on board.  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 Chronic left second toe ulceration He just completed 6 weeks of daptomycin /cefepime  on 3/28-has refused amputation in the past.  Concern is that he may have seeded his spine through foot infection.  Subsequently evaluated by Dr. Julio [name] on 4/28-underwent TMA on 4/30.  Toe-touch weightbearing on that leg.  Transient hypotension on 4/30 Now stable on low-dose Norvasc , clonidine /losartan /metoprolol  remains on hold-continue to monitor and add as appropriate.  Constipation Chronic issue-secondary to narcotics Finally had a BM after several days-remains on MiraLAX /senna-Movantik  added on 5/2. Dulcolax suppository as needed.  Mechanical fall Numerous imaging studies negative for fracture PT/OT eval  ESRD on HD TTS Nephrology following and directing HD care  Hyperkalemia on 02/20/2024 He underwent HD 02/20/2024 afternoon, 2 doses  of  Lokelma  on 02/22/2024, defer to nephrology  Prolonged QTc Twelve-lead EKG on 4/30 with normal QTc Telemetry monitoring  Normocytic anemia Secondary to ESRD Aranesp /iron  deferred to nephrology service.  Chronic HFpEF Euvolemic on exam Fluid removal with HD  HTN See above regarding hypotension BP relatively stable-see above discussion.  HLD Statin  Hypothyroidism Synthroid   BPH Flomax /bethanechol   History of right BKA  Debility/deconditioning Very reluctant to go to SNF-difficult situation-he has s/p right AKA-now left TMA several days ago-with touchdown weightbearing status.  Lives alone-minimal family assistance-his brother helps him some.  After extensive discussion-he seems a bit more accepting of the fact that he may have to go to SNF short-term.  BMI: Estimated body mass index is 23.63 kg/m as calculated from the following:   Height as of this encounter: 6\' 5"  (1.956 m).   Weight as of this encounter: 90.4 kg.   Code status:   Code Status: Full Code   DVT Prophylaxis: SCD's Start: 02/17/24 1538 heparin  injection 5,000 Units Start: 02/10/24 1400 SCDs Start: 02/08/24 2206   Family Communication: Brother-Thomas [name] updated 02/18/24   Disposition Plan: Status is: Inpatient Remains inpatient appropriate because: Severity of illness   Planned Discharge Destination: SNF   Diet: Diet Order             Diet renal with fluid restriction Fluid restriction: 1200 mL Fluid; Room service appropriate? Yes; Fluid consistency: Thin  Diet effective now                    MEDICATIONS: Scheduled Meds:  vitamin C   1,000 mg Oral Daily   atorvastatin   80 mg Oral Daily   bethanechol   10 mg Oral BID   Chlorhexidine  Gluconate Cloth  6 each Topical Q0600   darbepoetin (ARANESP ) injection - DIALYSIS  100 mcg Subcutaneous Q Sat-1800   heparin  injection (subcutaneous)  5,000 Units Subcutaneous Q8H   lactulose   30 g Oral Daily   leptospermum manuka honey  1  Application Topical Daily   levothyroxine   50 mcg Oral QAC breakfast   naloxegol  oxalate  12.5 mg Oral Daily   nutrition supplement (JUVEN)  1 packet Oral BID BM   oxyCODONE   30 mg Oral Q8H   polyethylene glycol  17 g Oral BID   senna-docusate  4 tablet Oral BID   sevelamer  carbonate  1,600 mg Oral TID WC   tamsulosin   0.4 mg Oral Daily   zinc  sulfate (50mg  elemental zinc )  220 mg Oral Daily   Continuous Infusions:  sodium chloride  10 mL/hr at 02/17/24 1600   ceFEPime  (MAXIPIME ) IV 2 g (02/27/24 1846)   promethazine  (PHENERGAN ) injection (IM or IVPB)     vancomycin  Stopped (02/27/24 1204)   PRN Meds:.acetaminophen  **OR** acetaminophen , bisacodyl , melatonin, naLOXone  (NARCAN )  injection, ondansetron  (ZOFRAN ) IV, oxyCODONE , promethazine  (PHENERGAN ) injection (IM or IVPB), tiZANidine    I have personally reviewed following labs and imaging studies  LABORATORY DATA: CBC: Recent Labs  Lab 02/22/24 0419 02/23/24 0813 02/24/24 0438 02/26/24 0625 02/27/24 0642  WBC 5.7 7.1 6.7 5.0 5.7  NEUTROABS  --  5.0  --   --  3.3  HGB 8.8* 9.5* 8.9* 8.5* 9.7*  HCT 27.4* 29.8* 27.4* 26.5* 30.6*  MCV 107.5* 106.0* 106.2* 104.7* 107.4*  PLT 165 173 172 149* 160    Basic Metabolic Panel: Recent Labs  Lab 02/23/24 0813 02/24/24 0438 02/25/24 0502 02/26/24 0625 02/27/24 0642  NA 139 136 137 133* 140  K 6.2* 5.9* 4.9 5.0 4.9  CL  100 96* 98 94* 99  CO2 20* 25 23 25 22   GLUCOSE 95 88 97 86 88  BUN 75* 48* 73* 43* 61*  CREATININE 9.22* 7.12* 8.83* 6.60* 8.73*  CALCIUM  8.7* 8.8* 8.9 8.9 9.4  PHOS 10.1* 7.2* 8.5* 7.1* 7.9*    GFR:  Estimated Creatinine Clearance: 9.9 mL/min (A) (by C-G formula based on SCr of 8.73 mg/dL (H)).  Liver Function Tests: Recent Labs  Lab 02/23/24 0813 02/24/24 0438 02/25/24 0502 02/26/24 0625 02/27/24 0642  ALBUMIN  3.3* 3.0* 3.2* 3.0* 3.3*    Urine analysis:     Component Value Date/Time   COLORURINE YELLOW 01/14/2022 0248   APPEARANCEUR HAZY  (A) 01/14/2022 0248   LABSPEC 1.009 01/14/2022 0248   PHURINE 7.0 01/14/2022 0248   GLUCOSEU NEGATIVE 01/14/2022 0248   HGBUR MODERATE (A) 01/14/2022 0248   BILIRUBINUR NEGATIVE 01/14/2022 0248   KETONESUR NEGATIVE 01/14/2022 0248   PROTEINUR 100 (A) 01/14/2022 0248   NITRITE NEGATIVE 01/14/2022 0248   LEUKOCYTESUR LARGE (A) 01/14/2022 0248    Sepsis Labs:  Lactic Acid, Venous    Component Value Date/Time   LATICACIDVEN 1.0 12/31/2021 0048    LOS: 20 days   Signature  -    Lynnwood Sauer M.D on 02/28/2024 at 8:59 AM   -  To page go to [email or web address]

## 2024-02-28 NOTE — Progress Notes (Signed)
 [facility] Progress Note   Subjective: Seen in room. No events overnight. Cramped a lot during dialysis yesterday despite minimal UF.   Objective Vitals:   02/27/24 2000 02/28/24 0044 02/28/24 0500 02/28/24 0834  BP: 99/68 103/75  122/80  Pulse: 73   75  Resp: 14 18  15   Temp: 97.9 F (36.6 C) 98 F (36.7 C)  98.1 F (36.7 C)  TempSrc: Oral Oral  Oral  SpO2: 94% 95%  100%  Weight:   90.4 kg   Height:       Physical Exam General: Alert, chronically- ill appearing, nad  Heart: RRR  Lungs: Clear, normal wob  Abdomen: Soft, non-tender Extremities: R BKA; + L TMA wrapped Dialysis Access: [facility]   Filed Weights   02/27/24 0846 02/27/24 1246 02/28/24 0500  Weight: 84.3 kg 84.3 kg 90.4 kg    Intake/Output Summary (Last 24 hours) at 02/28/2024 1026 Last data filed at 02/27/2024 1241 Gross per 24 hour  Intake --  Output 0 ml  Net 0 ml    Additional Objective Labs: Basic Metabolic Panel: Recent Labs  Lab 02/25/24 0502 02/26/24 0625 02/27/24 0642  NA 137 133* 140  K 4.9 5.0 4.9  CL 98 94* 99  CO2 23 25 22   GLUCOSE 97 86 88  BUN 73* 43* 61*  CREATININE 8.83* 6.60* 8.73*  CALCIUM  8.9 8.9 9.4  PHOS 8.5* 7.1* 7.9*   Liver Function Tests: Recent Labs  Lab 02/25/24 0502 02/26/24 0625 02/27/24 0642  ALBUMIN  3.2* 3.0* 3.3*   No results for input(s): "LIPASE", "AMYLASE" in the last 168 hours. CBC: Recent Labs  Lab 02/22/24 0419 02/23/24 0813 02/24/24 0438 02/26/24 0625 02/27/24 0642  WBC 5.7 7.1 6.7 5.0 5.7  NEUTROABS  --  5.0  --   --  3.3  HGB 8.8* 9.5* 8.9* 8.5* 9.7*  HCT 27.4* 29.8* 27.4* 26.5* 30.6*  MCV 107.5* 106.0* 106.2* 104.7* 107.4*  PLT 165 173 172 149* 160   Blood Culture    Component Value Date/Time   SDES BLOOD BLOOD RIGHT HAND AEROBIC BOTTLE ONLY 02/09/2024 0414   SPECREQUEST  02/09/2024 0414    BOTTLES DRAWN AEROBIC ONLY Blood Culture adequate volume   CULT  02/09/2024 0414    NO GROWTH 5 DAYS Performed at [facility], [address], [place], [place] [address]    REPTSTATUS 02/14/2024 FINAL 02/09/2024 0414    Cardiac Enzymes: No results for input(s): "CKTOTAL", "CKMB", "CKMBINDEX", "TROPONINI" in the last 168 hours. CBG: Recent Labs  Lab 02/27/24 0742 02/27/24 1437 02/27/24 1554 02/27/24 2117 02/28/24 0833  GLUCAP 92 162* 135* 130* 80   Iron  Studies: No results for input(s): "IRON ", "TIBC", "TRANSFERRIN", "FERRITIN" in the last 72 hours. Lab Results  Component Value Date   INR 1.1 02/09/2024   INR 1.1 02/08/2024   INR 1.3 (H) 01/06/2024   Studies/Results: No results found.  Medications:  sodium chloride  10 mL/hr at 02/17/24 1600   ceFEPime  (MAXIPIME ) IV 2 g (02/27/24 1846)   promethazine  (PHENERGAN ) injection (IM or IVPB)     vancomycin  Stopped (02/27/24 1204)    vitamin C   1,000 mg Oral Daily   atorvastatin   80 mg Oral Daily   bethanechol   10 mg Oral BID   Chlorhexidine  Gluconate Cloth  6 each Topical Q0600   darbepoetin (ARANESP ) injection - DIALYSIS  100 mcg Subcutaneous Q Sat-1800   heparin  injection (subcutaneous)  5,000 Units Subcutaneous Q8H   lactulose   30 g Oral Daily  leptospermum manuka honey  1 Application Topical Daily   levothyroxine   50 mcg Oral QAC breakfast   naloxegol  oxalate  12.5 mg Oral Daily   nutrition supplement (JUVEN)  1 packet Oral BID BM   oxyCODONE   30 mg Oral Q8H   polyethylene glycol  17 g Oral BID   senna-docusate  4 tablet Oral BID   sevelamer  carbonate  1,600 mg Oral TID WC   tamsulosin   0.4 mg Oral Daily   zinc  sulfate (50mg  elemental zinc )  220 mg Oral Daily    Dialysis Orders: TTS [place] 4h  B400  92kg  TDC  Heparin  none - Mircera 225mcg IV q 2 weeks (last 4/9) - Calcitriol  0.75mcg PO q HD  Assessment/Plan: 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 CT imaging showed T9-10 discitis. He refused MRI, area was too small for aspiration per IR. H/o of foot infection as below. Concerned that this infection may have  seeded the spine. Blood Cxs were negative. Not an operative candidate per neurosurgery.  Per ID continue IV Vanc 750mg  and IV Cefepime  2gm with HD x 6 weeks thru 03/23/24 for the discitis.  Left foot osteomyelitis - completed an 8 week course of Dapto/Cefepime  on 3/28 for bacteremia presumed due to foot infection. S/p left TMA on 4/30. Antibiotics as above.  Hyperkalemia -renal diet. Improved.  ESRD:  Usual TTS schedule. Next HD 5/13 HTN/volume: BPs okay, at EDW.  Has some residual UOP, euvolemic. Cramps with minimal UF -keep even  Anemia: HGB 8-9s. Follow labs. On weekly ESA. No venofer in setting of infection. Metabolic bone disease: Ca ok, Phos high - resumed home binders. 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 Dispo - For SNF placement.   Thomas Hal PA-C [facility] 02/28/2024,10:26 AM

## 2024-02-28 NOTE — Plan of Care (Signed)
  Problem: Education: Goal: Ability to describe self-care measures that may prevent or decrease complications (Diabetes Survival Skills Education) will improve Outcome: Progressing   Problem: Fluid Volume: Goal: Ability to maintain a balanced intake and output will improve Outcome: Progressing   Problem: Health Behavior/Discharge Planning: Goal: Ability to manage health-related needs will improve Outcome: Progressing   Problem: Nutritional: Goal: Maintenance of adequate nutrition will improve Outcome: Progressing   Problem: Skin Integrity: Goal: Risk for impaired skin integrity will decrease Outcome: Progressing   Problem: Clinical Measurements: Goal: Will remain free from infection Outcome: Progressing Goal: Diagnostic test results will improve Outcome: Progressing   Problem: Education: Goal: Ability to describe self-care measures that may prevent or decrease complications (Diabetes Survival Skills Education) will improve Outcome: Progressing   Problem: Fluid Volume: Goal: Ability to maintain a balanced intake and output will improve Outcome: Progressing   Problem: Health Behavior/Discharge Planning: Goal: Ability to manage health-related needs will improve Outcome: Progressing   Problem: Nutritional: Goal: Maintenance of adequate nutrition will improve Outcome: Progressing   Problem: Skin Integrity: Goal: Risk for impaired skin integrity will decrease Outcome: Progressing   Problem: Clinical Measurements: Goal: Will remain free from infection Outcome: Progressing Goal: Diagnostic test results will improve Outcome: Progressing

## 2024-02-29 DIAGNOSIS — M4624 Osteomyelitis of vertebra, thoracic region: Secondary | ICD-10-CM | POA: Diagnosis not present

## 2024-02-29 LAB — GLUCOSE, CAPILLARY
Glucose-Capillary: 90 mg/dL (ref 70–99)
Glucose-Capillary: 97 mg/dL (ref 70–99)
Glucose-Capillary: 106 mg/dL — ABNORMAL HIGH (ref 70–99)
Glucose-Capillary: 120 mg/dL — ABNORMAL HIGH (ref 70–99)

## 2024-02-29 MED ORDER — CHLORHEXIDINE GLUCONATE CLOTH 2 % EX PADS
6.0000 | MEDICATED_PAD | Freq: Every day | CUTANEOUS | Status: DC
  Administered 2024-03-01 – 2024-03-03 (×3): 6 via TOPICAL

## 2024-02-29 NOTE — TOC Progression Note (Addendum)
 Transition of Care [facility]) - Progression Note    Patient Details  Name: [name] MRN: [id number] Date of Birth: 1951/07/18  Transition of Care [facility]) CM/SW Contact  Juliane Och, LCSW Phone Number: 02/29/2024, 3:03 PM  Clinical Narrative:     3:03 PM CSW followed up with patient regarding SNF at bedside. Patient declined current bed offers ([facility], [place], [address], [place], [place]) and expressed interest in SNF with Medicare ratings of 4 or 5 stars. Patient consented CSW to expand SNF search. CSW expanded SNF search.  4:23 PM CSW contacted [name] (4 stars), [facility] (5 stars) and [facility] (3 stars) for possible bed offer. Patient's current SNF bed offer with highest rating is [facility] in [place] (3 stars).  4:30 PM [facility] SNF extended bed offer. [facility] declined bed offer.  Expected Discharge Plan: Home w Home Health Services Barriers to Discharge: Continued Medical Work up  Expected Discharge Plan and Services     Post Acute Care Choice: Skilled Nursing Facility Living arrangements for the past 2 months: Single Family Home                                       Social Determinants of Health (SDOH) Interventions SDOH Screenings   Food Insecurity: No Food Insecurity (02/09/2024)  Housing: Low Risk  (02/09/2024)  Transportation Needs: No Transportation Needs (02/09/2024)  Utilities: Not At Risk (02/09/2024)  Depression (PHQ2-9): Low Risk  (02/08/2020)  Social Connections: Socially Isolated (02/11/2024)  Tobacco Use: Low Risk  (02/07/2024)    Readmission Risk Interventions    01/15/2022    2:56 PM 01/02/2022    2:03 PM 12/31/2021   10:32 AM  Readmission Risk Prevention Plan  Transportation Screening Complete Complete Complete  Medication Review [employer]) Complete Complete Complete  PCP or Specialist appointment within 3-5 days of discharge Complete Complete Complete  HRI or Home  Care Consult Complete Complete Complete  SW Recovery Care/Counseling Consult Complete Complete Complete  Palliative Care Screening Not Applicable Not Applicable Not Applicable  Skilled Nursing Facility Not Applicable Not Applicable Not Applicable

## 2024-02-29 NOTE — Plan of Care (Signed)
  Problem: Education: Goal: Ability to describe self-care measures that may prevent or decrease complications (Diabetes Survival Skills Education) will improve Outcome: Progressing   Problem: Coping: Goal: Ability to adjust to condition or change in health will improve Outcome: Progressing   Problem: Health Behavior/Discharge Planning: Goal: Ability to manage health-related needs will improve Outcome: Progressing   Problem: Skin Integrity: Goal: Risk for impaired skin integrity will decrease Outcome: Progressing   Problem: Tissue Perfusion: Goal: Adequacy of tissue perfusion will improve Outcome: Progressing   Problem: Education: Goal: Knowledge of General Education information will improve Description: Including pain rating scale, medication(s)/side effects and non-pharmacologic comfort measures Outcome: Progressing   Problem: Clinical Measurements: Goal: Ability to maintain clinical measurements within normal limits will improve Outcome: Progressing Goal: Will remain free from infection Outcome: Progressing Goal: Diagnostic test results will improve Outcome: Progressing

## 2024-02-29 NOTE — Progress Notes (Signed)
 PROGRESS NOTE        PATIENT DETAILS Name: Thomas [name] Age: 73 y.o. Sex: male Date of Birth: February 14, 1951 Admit Date: 02/07/2024 Admitting Physician Roxana Copier, DO ZOX:WRUEAV, [name], MD  Brief Summary: Patient is a 73 y.o.  male who with history of ESRD on HD TTS, history of posterior fusion at T10-12 due to osteomyelitis/discitis/epidural abscess in 2021, history of right BKA who recently completed 6 weeks of daptomycin /cefepime  on 3/28 for left foot infection with osteomyelitis-presented with low back pain-upon further evaluation with CT imaging-he was found to have acute osteomyelitis involving his thoracic spine and admitted to the hospitalist service.  Significant events: 4/20>> admit to TRH-back pain-concern for thoracic osteomyelitis.  Significant studies: 4/21>> x-ray right elbow: No fracture. 4/21>> x-ray left wrist: No fracture 4/21>> x-ray left shoulder, no fracture. 4/21>> CT head: No acute intracranial abnormality 4/21>> CT C-spine: No fracture. 4/21>> CT abdomen/pelvis: No acute injury to chest/abdomen/pelvis 4/21>> CT T-spine: Prior posterior fusion from T10-T12-abnormal appearance with collapse of T10-lucency around T10 screws at the T9-10 disc space, T10-11 disc space concerning for discitis/osteomyelitis. 4/21>> CT L-spine: No acute bony abnormality, mild chronic compression fracture at L1. 4/22>> x-ray left foot: Chronic erosive changes about the forefoot similar to 11/21/2023-osteomyelitis/septic arthritis of the 1st/2nd MTP joints difficult to exclude. 4/25>> left ABI: Within normal limit.  Significant microbiology data: 4/22>> blood culture: No growth  Procedures: 4/30>> left transmetatarsal amputation by Dr. Julio Ohm.  Consults: Neurosurgery Infectious disease Ortho  Subjective: Patient in bed, appears comfortable, denies any headache, no fever, no chest pain or pressure, no shortness of breath , no abdominal pain. No focal  weakness.  Says if he does not find a SNF facility today than he is going home tomorrow.  Objective: Vitals: Blood pressure (!) 146/91, pulse 69, temperature 97.7 F (36.5 C), temperature source Oral, resp. rate 14, height 6\' 5"  (1.956 m), weight 93.8 kg, SpO2 95%.   Exam:  Awake/alert, in absolutely no distress Chest: Clear to auscultation CVS: S1-S2 regular Abdomen: Soft nontender nondistended Extremities: Left TMA dressing in place, s/p chronic right AKA.   Assessment/Plan:  Acute osteomyelitis with discitis involving T9 to T11 vertebra Per neurosurgery not an operative candidate Per IR-no window to aspirate Plan is for 6 weeks of vancomycin /cefepime  with hemodialysis-EOT 03/23/24 Blood cultures negative so far Back pain better on long and short acting Oxy codon, stop IV Dilaudid , he is in no distress and always found sleeping comfortably Continue to wear TLSO brace when out of the bed. Note patient is on high doses of narcotics chronically, exhibits some narcotic seeking behavior, counseled to avoid excessive use to avoid accidental overdose, as needed Narcan  on board.  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 Chronic left second toe ulceration He just completed 6 weeks of daptomycin /cefepime  on 3/28-has refused amputation in the past.  Concern is that he may have seeded his spine through foot infection.  Subsequently evaluated by Dr. Julio Ohm on 4/28-underwent TMA on 4/30.  Toe-touch weightbearing on that leg.  Transient hypotension on 4/30 Now stable on low-dose Norvasc , clonidine /losartan /metoprolol  remains on hold-continue to monitor and add as appropriate.  Constipation Chronic issue-secondary to narcotics Finally had a BM after several days-remains on MiraLAX /senna-Movantik  added on 5/2. Dulcolax suppository as needed.  Mechanical fall Numerous imaging studies negative for fracture PT/OT eval  ESRD on HD TTS Nephrology  following and directing HD  care  Hyperkalemia on 02/20/2024 He underwent HD 02/20/2024 afternoon, 2 doses of Lokelma  on 02/22/2024, defer to nephrology  Prolonged QTc Twelve-lead EKG on 4/30 with normal QTc Telemetry monitoring  Normocytic anemia Secondary to ESRD Aranesp /iron  deferred to nephrology service.  Chronic HFpEF Euvolemic on exam Fluid removal with HD  HTN See above regarding hypotension BP relatively stable-see above discussion.  HLD Statin  Hypothyroidism Synthroid   BPH Flomax /bethanechol   History of right BKA  Debility/deconditioning Very reluctant to go to SNF-difficult situation-he has s/p right AKA-now left TMA several days ago-with touchdown weightbearing status.  Lives alone-minimal family assistance-his brother helps him some.  After extensive discussion-he seems a bit more accepting of the fact that he may have to go to SNF short-term.  BMI: Estimated body mass index is 24.52 kg/m as calculated from the following:   Height as of this encounter: 6\' 5"  (1.956 m).   Weight as of this encounter: 93.8 kg.   Code status:   Code Status: Full Code   DVT Prophylaxis: SCD's Start: 02/17/24 1538 heparin  injection 5,000 Units Start: 02/10/24 1400 SCDs Start: 02/08/24 2206   Family Communication: Brother-[phone] updated 02/18/24   Disposition Plan: Status is: Inpatient Remains inpatient appropriate because: Severity of illness   Planned Discharge Destination: SNF   Diet: Diet Order             Diet renal with fluid restriction Fluid restriction: 1200 mL Fluid; Room service appropriate? Yes; Fluid consistency: Thin  Diet effective now                    MEDICATIONS: Scheduled Meds:  vitamin C   1,000 mg Oral Daily   atorvastatin   80 mg Oral Daily   bethanechol   10 mg Oral BID   Chlorhexidine  Gluconate Cloth  6 each Topical Q0600   darbepoetin (ARANESP ) injection - DIALYSIS  100 mcg Subcutaneous Q Sat-1800   heparin  injection (subcutaneous)  5,000 Units  Subcutaneous Q8H   lactulose   30 g Oral Daily   leptospermum manuka honey  1 Application Topical Daily   levothyroxine   50 mcg Oral QAC breakfast   naloxegol  oxalate  12.5 mg Oral Daily   nutrition supplement (JUVEN)  1 packet Oral BID BM   oxyCODONE   30 mg Oral Q8H   polyethylene glycol  17 g Oral BID   senna-docusate  4 tablet Oral BID   sevelamer  carbonate  1,600 mg Oral TID WC   tamsulosin   0.4 mg Oral Daily   zinc  sulfate (50mg  elemental zinc )  220 mg Oral Daily   Continuous Infusions:  sodium chloride  10 mL/hr at 02/17/24 1600   ceFEPime  (MAXIPIME ) IV 2 g (02/27/24 1846)   promethazine  (PHENERGAN ) injection (IM or IVPB)     vancomycin  Stopped (02/27/24 1204)   PRN Meds:.acetaminophen  **OR** acetaminophen , bisacodyl , melatonin, naLOXone  (NARCAN )  injection, ondansetron  (ZOFRAN ) IV, oxyCODONE , promethazine  (PHENERGAN ) injection (IM or IVPB), tiZANidine    I have personally reviewed following labs and imaging studies  LABORATORY DATA: CBC: Recent Labs  Lab 02/23/24 0813 02/24/24 0438 02/26/24 0625 02/27/24 0642  WBC 7.1 6.7 5.0 5.7  NEUTROABS 5.0  --   --  3.3  HGB 9.5* 8.9* 8.5* 9.7*  HCT 29.8* 27.4* 26.5* 30.6*  MCV 106.0* 106.2* 104.7* 107.4*  PLT 173 172 149* 160    Basic Metabolic Panel: Recent Labs  Lab 02/23/24 0813 02/24/24 0438 02/25/24 0502 02/26/24 0625 02/27/24 0642  NA 139 136 137 133* 140  K 6.2*  5.9* 4.9 5.0 4.9  CL 100 96* 98 94* 99  CO2 20* 25 23 25 22   GLUCOSE 95 88 97 86 88  BUN 75* 48* 73* 43* 61*  CREATININE 9.22* 7.12* 8.83* 6.60* 8.73*  CALCIUM  8.7* 8.8* 8.9 8.9 9.4  PHOS 10.1* 7.2* 8.5* 7.1* 7.9*    GFR:  Estimated Creatinine Clearance: 9.9 mL/min (A) (by C-G formula based on SCr of 8.73 mg/dL (H)).  Liver Function Tests: Recent Labs  Lab 02/23/24 0813 02/24/24 0438 02/25/24 0502 02/26/24 0625 02/27/24 0642  ALBUMIN  3.3* 3.0* 3.2* 3.0* 3.3*    Urine analysis:     Component Value Date/Time   COLORURINE YELLOW  01/14/2022 0248   APPEARANCEUR HAZY (A) 01/14/2022 0248   LABSPEC 1.009 01/14/2022 0248   PHURINE 7.0 01/14/2022 0248   GLUCOSEU NEGATIVE 01/14/2022 0248   HGBUR MODERATE (A) 01/14/2022 0248   BILIRUBINUR NEGATIVE 01/14/2022 0248   KETONESUR NEGATIVE 01/14/2022 0248   PROTEINUR 100 (A) 01/14/2022 0248   NITRITE NEGATIVE 01/14/2022 0248   LEUKOCYTESUR LARGE (A) 01/14/2022 0248    Sepsis Labs:  Lactic Acid, Venous    Component Value Date/Time   LATICACIDVEN 1.0 12/31/2021 0048    LOS: 21 days   Signature  -    Lynnwood Sauer M.D on 02/29/2024 at 8:10 AM   -  To page go to [email or web address]

## 2024-02-29 NOTE — Progress Notes (Signed)
 Physical Therapy Treatment Patient Details Name: Thomas [name] MRN: [id number] DOB: May 06, 1951 Today's Date: 02/29/2024   History of Present Illness The pt is a 73 yo male presenting 4/20 with back pain after a fall at home. 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 PMH includes: ESRD on HD TTS, prior thoracic spine fusion, spinal 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-T12, chronic thoracic osteomyelitis, chronic pain syndrome, gout, PE not on anticoagulation, DM-2, right BKA, left second toe ulcer and HTN    PT Comments  Tolerated well, able to transfer out of bed with min assist to safely maintain LLE TDWB precautions using lateral scoot technique from bed to drop arm recliner. Reviewed LE exercises. Educated on precautions, increasing mobility as tolerated. Assisted with gown change has pt had been diaphoretic prior to my arrival. Patient will continue to benefit from skilled physical therapy services to further improve independence with functional mobility.'     If plan is discharge home, recommend the following: Two people to help with walking and/or transfers;Two people to help with bathing/dressing/bathroom;Assistance with cooking/housework;Assistance with feeding;Direct supervision/assist for medications management;Direct supervision/assist for financial management;Assist for transportation;Help with stairs or ramp for entrance   Can travel by private vehicle     No  Equipment Recommendations  Hospital bed (States w/c will not fit in home)    Recommendations for Other Services       Precautions / Restrictions Precautions Precautions: Back;Fall (back for comfort) Precaution Booklet Issued: Yes (comment) Recall of Precautions/Restrictions: Intact Precaution/Restrictions Comments:  (educated on spinal precautions for comfort, no formal back precautions ordered. WB precautions TDWB Lt foot) Required Braces or Orthoses: Spinal Brace;Other Brace Spinal  Brace: Thoracolumbosacral orthotic;Applied in sitting position Other Brace: Post op shoe Restrictions Weight Bearing Restrictions Per Provider Order: Yes LLE Weight Bearing Per Provider Order: Touchdown weight bearing Other Position/Activity Restrictions: Post op shoe     Mobility  Bed Mobility Overal bed mobility: Needs Assistance Bed Mobility: Rolling, Sidelying to Sit Rolling: Supervision, Used rails Sidelying to sit: Supervision       General bed mobility comments: Supervision for safety, able to perform with use of rail and HOB elevated. cues for techniques with log roll. Cannot tolerate HOB flat.    Transfers Overall transfer level: Needs assistance Equipment used: None Transfers: Bed to chair/wheelchair/BSC            Lateral/Scoot Transfers: Min assist General transfer comment: Able to push through UEs to clear buttocks, lateral scoot from bed to drop arm recliner. Min assist to manage LLE and prevent >TDWB through LLE.    Ambulation/Gait                   Stairs             Wheelchair Mobility     Tilt Bed    Modified Rankin (Stroke Patients Only)       Balance Overall balance assessment: History of Falls, Needs assistance Sitting-balance support: No upper extremity supported Sitting balance-[name]: Fair Sitting balance - Comments: Sits EOB unsupported, supervision for safety.                                    Communication Communication Communication: No apparent difficulties  Cognition Arousal: Alert Behavior During Therapy: WFL for tasks assessed/performed   PT - Cognitive impairments: No family/caregiver present to determine baseline  Following commands: Intact      Cueing Cueing Techniques: Verbal cues  Exercises General Exercises - Lower Extremity Ankle Circles/Pumps: AAROM, Left, 10 reps, Supine Quad Sets: Both, 10 reps, Strengthening, Supine Hip  ABduction/ADduction: Both, 10 reps, Strengthening, Seated Straight Leg Raises: Strengthening, Both, 5 reps, Supine    General Comments General comments (skin integrity, edema, etc.): VSS. Lightheaded and nauseous after transfer. BP 129/92. SpO2 upper 90s with good waveform.      Pertinent Vitals/Pain Pain Assessment Pain Assessment: Faces Faces Pain Scale: Hurts whole lot Pain Location: back Pain Descriptors / Indicators: Aching Pain Intervention(s): Monitored during session, Repositioned, Patient requesting pain meds-RN notified    Home Living                          Prior Function            PT Goals (current goals can now be found in the care plan section) Acute Rehab PT Goals Patient Stated Goal: to reduce pain PT Goal Formulation: With patient Time For Goal Achievement: 03/03/24 Potential to Achieve Goals: Fair Progress towards PT goals: Progressing toward goals    Frequency    Min 2X/week      PT Plan      Co-evaluation              AM-PAC PT "6 Clicks" Mobility   Outcome Measure  Help needed turning from your back to your side while in a flat bed without using bedrails?: A Little Help needed moving from lying on your back to sitting on the side of a flat bed without using bedrails?: A Little Help needed moving to and from a bed to a chair (including a wheelchair)?: A Little Help needed standing up from a chair using your arms (e.g., wheelchair or bedside chair)?: Total Help needed to walk in hospital room?: Total Help needed climbing 3-5 steps with a railing? : Total 6 Click Score: 12    End of Session   Activity Tolerance: Patient tolerated treatment well Patient left: with call bell/phone within reach;in chair;with chair alarm set Nurse Communication: Mobility status PT Visit Diagnosis: Unsteadiness on feet (R26.81);Other abnormalities of gait and mobility (R26.89);Repeated falls (R29.6);Muscle weakness (generalized)  (M62.81);Pain;Difficulty in walking, not elsewhere classified (R26.2);History of falling (Z91.81) Pain - part of body:  (back)     Time: 6440-3474 PT Time Calculation (min) (ACUTE ONLY): 29 min  Charges:    $Therapeutic Exercise: 8-22 mins $Therapeutic Activity: 8-22 mins PT General Charges $$ ACUTE PT VISIT: 1 Visit                     Jory Ng, PT, DPT [facility]  Rehabilitation Services Physical Therapist Office: [phone] Website: [email or web address]    [name] 02/29/2024, 5:08 PM

## 2024-02-29 NOTE — Progress Notes (Signed)
 [facility] Progress Note   Subjective: Seen in room this morning. No specific complaints. No reported dyspnea or CP. Last HD 02/27/24 with a total of 3.5 hrs and was kept even.   Objective Vitals:   02/29/24 0333 02/29/24 0500 02/29/24 0734 02/29/24 1153  BP: 105/69  (!) 146/91 (!) 136/110  Pulse:   69 72  Resp: 18  14 16   Temp: 98.7 F (37.1 C)  97.7 F (36.5 C) 98.2 F (36.8 C)  TempSrc: Oral  Oral Oral  SpO2:   95% 97%  Weight:  93.8 kg    Height:       Physical Exam General: Alert, chronically- ill appearing, nad  Heart: RRR  Lungs: Clear, normal wob  Abdomen: Soft, non-tender Extremities: R BKA; + L TMA wrapped Dialysis Access: [facility]   Filed Weights   02/27/24 1246 02/28/24 0500 02/29/24 0500  Weight: 84.3 kg 90.4 kg 93.8 kg    Intake/Output Summary (Last 24 hours) at 02/29/2024 1303 Last data filed at 02/29/2024 1000 Gross per 24 hour  Intake 1650 ml  Output 250 ml  Net 1400 ml    Additional Objective Labs: Basic Metabolic Panel: Recent Labs  Lab 02/25/24 0502 02/26/24 0625 02/27/24 0642  NA 137 133* 140  K 4.9 5.0 4.9  CL 98 94* 99  CO2 23 25 22   GLUCOSE 97 86 88  BUN 73* 43* 61*  CREATININE 8.83* 6.60* 8.73*  CALCIUM  8.9 8.9 9.4  PHOS 8.5* 7.1* 7.9*   Liver Function Tests: Recent Labs  Lab 02/25/24 0502 02/26/24 0625 02/27/24 0642  ALBUMIN  3.2* 3.0* 3.3*   No results for input(s): "LIPASE", "AMYLASE" in the last 168 hours. CBC: Recent Labs  Lab 02/23/24 0813 02/24/24 0438 02/26/24 0625 02/27/24 0642  WBC 7.1 6.7 5.0 5.7  NEUTROABS 5.0  --   --  3.3  HGB 9.5* 8.9* 8.5* 9.7*  HCT 29.8* 27.4* 26.5* 30.6*  MCV 106.0* 106.2* 104.7* 107.4*  PLT 173 172 149* 160   Blood Culture    Component Value Date/Time   SDES BLOOD BLOOD RIGHT HAND AEROBIC BOTTLE ONLY 02/09/2024 0414   SPECREQUEST  02/09/2024 0414    BOTTLES DRAWN AEROBIC ONLY Blood Culture adequate volume   CULT  02/09/2024 0414    NO GROWTH 5 DAYS Performed  at [facility], [address] [place], [place] [address]    REPTSTATUS 02/14/2024 FINAL 02/09/2024 0414    Cardiac Enzymes: No results for input(s): "CKTOTAL", "CKMB", "CKMBINDEX", "TROPONINI" in the last 168 hours. CBG: Recent Labs  Lab 02/28/24 1221 02/28/24 1546 02/28/24 2118 02/29/24 0735 02/29/24 1154  GLUCAP 92 110* 187* 90 97   Iron  Studies: No results for input(s): "IRON ", "TIBC", "TRANSFERRIN", "FERRITIN" in the last 72 hours. Lab Results  Component Value Date   INR 1.1 02/09/2024   INR 1.1 02/08/2024   INR 1.3 (H) 01/06/2024   Studies/Results: No results found.  Medications:  sodium chloride  10 mL/hr at 02/29/24 1000   ceFEPime  (MAXIPIME ) IV Stopped (02/27/24 1916)   promethazine  (PHENERGAN ) injection (IM or IVPB)     vancomycin  Stopped (02/27/24 1127)    vitamin C   1,000 mg Oral Daily   atorvastatin   80 mg Oral Daily   bethanechol   10 mg Oral BID   Chlorhexidine  Gluconate Cloth  6 each Topical Q0600   darbepoetin (ARANESP ) injection - DIALYSIS  100 mcg Subcutaneous Q Sat-1800   heparin  injection (subcutaneous)  5,000 Units Subcutaneous Q8H   lactulose   30  g Oral Daily   leptospermum manuka honey  1 Application Topical Daily   levothyroxine   50 mcg Oral QAC breakfast   naloxegol  oxalate  12.5 mg Oral Daily   nutrition supplement (JUVEN)  1 packet Oral BID BM   oxyCODONE   30 mg Oral Q8H   polyethylene glycol  17 g Oral BID   senna-docusate  4 tablet Oral BID   sevelamer  carbonate  1,600 mg Oral TID WC   tamsulosin   0.4 mg Oral Daily   zinc  sulfate (50mg  elemental zinc )  220 mg Oral Daily    Dialysis Orders: TTS [facility] 4h  B400  92kg  TDC  Heparin  none - Mircera 225mcg IV q 2 weeks (last 4/9) - Calcitriol  0.60mcg PO q HD  Assessment/Plan: 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 CT imaging showed T9-10 discitis. He refused MRI, area was too small for aspiration per IR. H/o of foot infection as below. Concerned that this  infection may have seeded the spine. Blood Cxs were negative. Not an operative candidate per neurosurgery.  Per ID continue IV Vanc 750mg  and IV Cefepime  2gm with HD x 6 weeks thru 03/23/24 for the discitis.  Left foot osteomyelitis - completed an 8 week course of Dapto/Cefepime  on 3/28 for bacteremia presumed due to foot infection. S/p left TMA on 4/30. Antibiotics as above.  Hyperkalemia -renal diet. Improved.  ESRD:  Usual TTS schedule. Next HD 5/13 HTN/volume: BPs okay, at EDW.  Has some residual UOP, euvolemic. Cramps with minimal UF -keep even  Anemia: HGB 9.7. Follow labs. On weekly ESA. No venofer in setting of infection. Metabolic bone disease: Ca ok, Phos high - resumed home binders. 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 Dispo - For SNF placement.   Hersey Lorenzo, NP [facility] 02/29/2024,1:03 PM

## 2024-03-01 DIAGNOSIS — M4624 Osteomyelitis of vertebra, thoracic region: Secondary | ICD-10-CM | POA: Diagnosis not present

## 2024-03-01 LAB — RENAL FUNCTION PANEL
Albumin: 3.5 g/dL (ref 3.5–5.0)
Anion gap: 20 — ABNORMAL HIGH (ref 5–15)
BUN: 70 mg/dL — ABNORMAL HIGH (ref 8–23)
CO2: 22 mmol/L (ref 22–32)
Calcium: 9.3 mg/dL (ref 8.9–10.3)
Chloride: 99 mmol/L (ref 98–111)
Creatinine, Ser: 10.53 mg/dL — ABNORMAL HIGH (ref 0.61–1.24)
GFR, Estimated: 5 mL/min — ABNORMAL LOW (ref >60)
Glucose, Bld: 98 mg/dL (ref 70–99)
Phosphorus: 8.6 mg/dL — ABNORMAL HIGH (ref 2.5–4.6)
Potassium: 4.3 mmol/L (ref 3.5–5.1)
Sodium: 141 mmol/L (ref 135–145)

## 2024-03-01 LAB — CBC
HCT: 46.6 % (ref 39.0–52.0)
Hemoglobin: 14.8 g/dL (ref 13.0–17.0)
MCH: 34.3 pg — ABNORMAL HIGH (ref 26.0–34.0)
MCHC: 31.8 g/dL (ref 30.0–36.0)
MCV: 107.9 fL — ABNORMAL HIGH (ref 80.0–100.0)
Platelets: 121 10*3/uL — ABNORMAL LOW (ref 150–400)
RBC: 4.32 MIL/uL (ref 4.22–5.81)
RDW: 14.4 % (ref 11.5–15.5)
WBC: 4.8 10*3/uL (ref 4.0–10.5)
nRBC: 0 % (ref 0.0–0.2)

## 2024-03-01 LAB — GLUCOSE, CAPILLARY
Glucose-Capillary: 89 mg/dL (ref 70–99)
Glucose-Capillary: 95 mg/dL (ref 70–99)
Glucose-Capillary: 92 mg/dL (ref 70–99)
Glucose-Capillary: 93 mg/dL (ref 70–99)

## 2024-03-01 MED ORDER — HEPARIN SODIUM (PORCINE) 1000 UNIT/ML IJ SOLN
INTRAMUSCULAR | Status: AC
  Filled 2024-03-01: qty 4

## 2024-03-01 MED ORDER — ALBUMIN HUMAN 25 % IV SOLN
25.0000 g | Freq: Once | INTRAVENOUS | Status: AC
  Administered 2024-03-08: 25 g via INTRAVENOUS
  Filled 2024-03-01: qty 100

## 2024-03-01 MED ORDER — OXYCODONE HCL 5 MG PO TABS
ORAL_TABLET | ORAL | Status: AC
Start: 2024-03-01 — End: ?
  Filled 2024-03-01: qty 2

## 2024-03-01 MED ORDER — ALBUMIN HUMAN 25 % IV SOLN
12.5000 g | Freq: Once | INTRAVENOUS | Status: AC
  Administered 2024-03-01: 12.5 g via INTRAVENOUS

## 2024-03-01 MED ORDER — HEPARIN SODIUM (PORCINE) 1000 UNIT/ML IJ SOLN
INTRAMUSCULAR | Status: AC
  Filled 2024-03-01: qty 1

## 2024-03-01 MED ORDER — DEXTROSE 50 % IV SOLN
INTRAVENOUS | Status: AC
Start: 2024-03-01 — End: 2024-03-02
  Filled 2024-03-01: qty 50

## 2024-03-01 NOTE — Progress Notes (Signed)
 OT Cancellation Note  Patient Details Name: Thomas [name] MRN: [id number] DOB: [birth year]   Cancelled Treatment:    Reason Eval/Treat Not Completed: Patient at procedure or test/ unavailable (Pt at HD will follow up.)  Thomas [name] K OTR/L  Acute Rehab Services  [phone] office number   Thomas [name] 03/01/2024, 9:07 AM

## 2024-03-01 NOTE — TOC Progression Note (Addendum)
 Transition of Care [facility]) - Progression Note    Patient Details  Name: Thomas [name] MRN: [id number] Date of Birth: Nov 23, 1950  [facility] CM/SW Contact  Jannice Mends, LCSW Phone Number: 03/01/2024, 1:55 PM  Clinical Narrative:    CSW met with patient to get a final decision from him regarding disposition plan. Patient stated he did not like the SNF options but cannot go home in his current condition. CSW explained that the MD is ready to discharge him so perhaps he should select a SNF since he cannot return home. CSW presented him with additional SNF offers. Patient pleasant and stated he is willing to try [facility]. CSW requested Renal Navigator check for [place] MWF HD chair. Will begin insurance process once OP Chair obtained.  CSW discussed with patient if he is willing to go to SNF under his Medicaid/SSI check but patient declined stating he would lose his home if he had to turn over his check. CSW will attempt rehabilitation authorization.   Expected Discharge Plan: Skilled Nursing Facility Barriers to Discharge: Continued Medical Work up, English as a second language teacher (OP HD chair)  Expected Discharge Plan and Services     Post Acute Care Choice: Skilled Nursing Facility Living arrangements for the past 2 months: Single Family Home                                       Social Determinants of Health (SDOH) Interventions SDOH Screenings   Food Insecurity: No Food Insecurity (02/09/2024)  Housing: Low Risk  (02/09/2024)  Transportation Needs: No Transportation Needs (02/09/2024)  Utilities: Not At Risk (02/09/2024)  Depression (PHQ2-9): Low Risk  (02/08/2020)  Social Connections: Socially Isolated (02/11/2024)  Tobacco Use: Low Risk  (02/07/2024)    Readmission Risk Interventions    01/15/2022    2:56 PM 01/02/2022    2:03 PM 12/31/2021   10:32 AM  Readmission Risk Prevention Plan  Transportation Screening Complete Complete Complete  Medication Review  Oceanographer) Complete Complete Complete  PCP or Specialist appointment within 3-5 days of discharge Complete Complete Complete  HRI or Home Care Consult Complete Complete Complete  SW Recovery Care/Counseling Consult Complete Complete Complete  Palliative Care Screening Not Applicable Not Applicable Not Applicable  Skilled Nursing Facility Not Applicable Not Applicable Not Applicable

## 2024-03-01 NOTE — Progress Notes (Signed)
 PROGRESS NOTE        PATIENT DETAILS Name: Thomas [name] Age: 73 y.o. Sex: male Date of Birth: [birth year] Admit Date: 02/07/2024 Admitting Physician Roxana Copier, DO UYQ:[id number], [name], MD  Brief Summary: Patient is a 73 y.o.  male who with history of ESRD on HD TTS, history of posterior fusion at T10-12 due to osteomyelitis/discitis/epidural abscess in 2021, history of right BKA who recently completed 6 weeks of daptomycin /cefepime  on 3/28 for left foot infection with osteomyelitis-presented with low back pain-upon further evaluation with CT imaging-he was found to have acute osteomyelitis involving his thoracic spine and admitted to the hospitalist service.  Significant events: 4/20>> admit to TRH-back pain-concern for thoracic osteomyelitis.  Significant studies: 4/21>> x-ray right elbow: No fracture. 4/21>> x-ray left wrist: No fracture 4/21>> x-ray left shoulder, no fracture. 4/21>> CT head: No acute intracranial abnormality 4/21>> CT C-spine: No fracture. 4/21>> CT abdomen/pelvis: No acute injury to chest/abdomen/pelvis 4/21>> CT T-spine: Prior posterior fusion from T10-T12-abnormal appearance with collapse of T10-lucency around T10 screws at the T9-10 disc space, T10-11 disc space concerning for discitis/osteomyelitis. 4/21>> CT L-spine: No acute bony abnormality, mild chronic compression fracture at L1. 4/22>> x-ray left foot: Chronic erosive changes about the forefoot similar to 11/21/2023-osteomyelitis/septic arthritis of the 1st/2nd MTP joints difficult to exclude. 4/25>> left ABI: Within normal limit.  Significant microbiology data: 4/22>> blood culture: No growth  Procedures: 4/30>> left transmetatarsal amputation by Dr. Julio Ohm.  Consults: Neurosurgery Infectious disease Ortho  Subjective:  Patient in bed, appears comfortable, denies any headache, no fever, no chest pain or pressure, no shortness of breath , no abdominal pain. No new  focal weakness.   Objective: Vitals: Blood pressure 99/74, pulse 74, temperature 97.8 F (36.6 C), resp. rate (!) 21, height 6\' 5"  (1.956 m), weight 85 kg, SpO2 100%.   Exam:  Awake/alert, in absolutely no distress Chest: Clear to auscultation CVS: S1-S2 regular Abdomen: Soft nontender nondistended Extremities: Left TMA dressing in place, s/p chronic right AKA.   Assessment/Plan:  Acute osteomyelitis with discitis involving T9 to T11 vertebra Per neurosurgery not an operative candidate Per IR-no window to aspirate Plan is for 6 weeks of vancomycin /cefepime  with hemodialysis-EOT 03/23/24 Blood cultures negative so far Back pain better on long and short acting Oxy codon, stop IV Dilaudid , he is in no distress and always found sleeping comfortably Continue to wear TLSO brace when out of the bed. Note patient is on high doses of narcotics chronically, exhibits some narcotic seeking behavior, counseled to avoid excessive use to avoid accidental overdose, as needed Narcan  on board.  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 Chronic left second toe ulceration He just completed 6 weeks of daptomycin /cefepime  on 3/28-has refused amputation in the past.  Concern is that he may have seeded his spine through foot infection.  Subsequently evaluated by Dr. Julio Ohm on 4/28-underwent TMA on 4/30.  Toe-touch weightbearing on that leg.  Transient hypotension on 4/30 Now stable on low-dose Norvasc , clonidine /losartan /metoprolol  remains on hold-continue to monitor and add as appropriate.  Constipation Chronic issue-secondary to narcotics Finally had a BM after several days-remains on MiraLAX /senna-Movantik  added on 5/2. Dulcolax suppository as needed.  Mechanical fall Numerous imaging studies negative for fracture PT/OT eval  ESRD on HD TTS Nephrology following and directing HD care  Hyperkalemia on 02/20/2024 He underwent HD 02/20/2024 afternoon, 2 doses of  Lokelma  on 02/22/2024,  defer to nephrology  Prolonged QTc Twelve-lead EKG on 4/30 with normal QTc Telemetry monitoring  Normocytic anemia Secondary to ESRD Aranesp /iron  deferred to nephrology service.  Chronic HFpEF Euvolemic on exam Fluid removal with HD  HTN See above regarding hypotension BP relatively stable-see above discussion.  HLD Statin  Hypothyroidism Synthroid   BPH Flomax /bethanechol   History of right BKA  Debility/deconditioning He has been medically ready for SNF but refuses to pick a place for at least 1 week now, he has been told that if he does not want to go to SNF he will be sent home which he is agreeable to, he now wants 1 more day to decide, we will make a decision on 03/01/2024.  BMI: Estimated body mass index is 22.22 kg/m as calculated from the following:   Height as of this encounter: 6\' 5"  (1.956 m).   Weight as of this encounter: 85 kg.   Code status:   Code Status: Full Code   DVT Prophylaxis: SCD's Start: 02/17/24 1538 heparin  injection 5,000 Units Start: 02/10/24 1400 SCDs Start: 02/08/24 2206   Family Communication: [id number] updated 02/18/24   Disposition Plan: Status is: Inpatient Remains inpatient appropriate because: Severity of illness   Planned Discharge Destination: SNF   Diet: Diet Order             Diet renal with fluid restriction Fluid restriction: 1200 mL Fluid; Room service appropriate? Yes; Fluid consistency: Thin  Diet effective now                    MEDICATIONS: Scheduled Meds:  vitamin C   1,000 mg Oral Daily   atorvastatin   80 mg Oral Daily   bethanechol   10 mg Oral BID   Chlorhexidine  Gluconate Cloth  6 each Topical Q0600   Chlorhexidine  Gluconate Cloth  6 each Topical Q0600   darbepoetin (ARANESP ) injection - DIALYSIS  100 mcg Subcutaneous Q Sat-1800   heparin  injection (subcutaneous)  5,000 Units Subcutaneous Q8H   lactulose   30 g Oral Daily   leptospermum manuka honey  1 Application Topical Daily    levothyroxine   50 mcg Oral QAC breakfast   naloxegol  oxalate  12.5 mg Oral Daily   nutrition supplement (JUVEN)  1 packet Oral BID BM   oxyCODONE   30 mg Oral Q8H   polyethylene glycol  17 g Oral BID   senna-docusate  4 tablet Oral BID   sevelamer  carbonate  1,600 mg Oral TID WC   tamsulosin   0.4 mg Oral Daily   zinc  sulfate (50mg  elemental zinc )  220 mg Oral Daily   Continuous Infusions:  sodium chloride  10 mL/hr at 02/29/24 1700   ceFEPime  (MAXIPIME ) IV Stopped (02/27/24 1916)   promethazine  (PHENERGAN ) injection (IM or IVPB)     vancomycin  Stopped (02/27/24 1127)   PRN Meds:.acetaminophen  **OR** acetaminophen , bisacodyl , melatonin, naLOXone  (NARCAN )  injection, ondansetron  (ZOFRAN ) IV, oxyCODONE , promethazine  (PHENERGAN ) injection (IM or IVPB), tiZANidine    I have personally reviewed following labs and imaging studies  LABORATORY DATA: CBC: Recent Labs  Lab 02/24/24 0438 02/26/24 0625 02/27/24 0642 03/01/24 0748  WBC 6.7 5.0 5.7 4.8  NEUTROABS  --   --  3.3  --   HGB 8.9* 8.5* 9.7* 14.8  HCT 27.4* 26.5* 30.6* 46.6  MCV 106.2* 104.7* 107.4* 107.9*  PLT 172 149* 160 121*    Basic Metabolic Panel: Recent Labs  Lab 02/24/24 0438 02/25/24 0502 02/26/24 0625 02/27/24 0642 03/01/24 0748  NA 136 137 133* 140 141  K 5.9* 4.9 5.0 4.9 4.3  CL 96* 98 94* 99 99  CO2 25 23 25 22 22   GLUCOSE 88 97 86 88 98  BUN 48* 73* 43* 61* 70*  CREATININE 7.12* 8.83* 6.60* 8.73* 10.53*  CALCIUM  8.8* 8.9 8.9 9.4 9.3  PHOS 7.2* 8.5* 7.1* 7.9* 8.6*    GFR:  Estimated Creatinine Clearance: 7.8 mL/min (A) (by C-G formula based on SCr of 10.53 mg/dL (H)).  Liver Function Tests: Recent Labs  Lab 02/24/24 0438 02/25/24 0502 02/26/24 0625 02/27/24 0642 03/01/24 0748  ALBUMIN  3.0* 3.2* 3.0* 3.3* 3.5    Urine analysis:     Component Value Date/Time   COLORURINE YELLOW 01/14/2022 0248   APPEARANCEUR HAZY (A) 01/14/2022 0248   LABSPEC 1.009 01/14/2022 0248   PHURINE 7.0  01/14/2022 0248   GLUCOSEU NEGATIVE 01/14/2022 0248   HGBUR MODERATE (A) 01/14/2022 0248   BILIRUBINUR NEGATIVE 01/14/2022 0248   KETONESUR NEGATIVE 01/14/2022 0248   PROTEINUR 100 (A) 01/14/2022 0248   NITRITE NEGATIVE 01/14/2022 0248   LEUKOCYTESUR LARGE (A) 01/14/2022 0248    Sepsis Labs:  Lactic Acid, Venous    Component Value Date/Time   LATICACIDVEN 1.0 12/31/2021 0048    LOS: 22 days   Signature  -    Lynnwood Sauer M.D on 03/01/2024 at 9:50 AM   -  To page go to [email or web address]

## 2024-03-01 NOTE — Significant Event (Addendum)
 Rapid Response Event Note   Reason for Call :  Following HD treatment, pt complained of burning sensation from the waist down.   Initial Focused Assessment:  Pt lying in bed, alert. Breathing is regular, unlabored. Clear breath sounds. Skin is warm, dry, pink. Radial pulses 2+.   He intermittently is stating his "throat is closing up". At time of complaint his breathing is unlabored, no stridor, SpO2 100%, and he is speaking clear sentences.  He endorses a "tightening" feeling and points to his stomach. Abdomen soft, bowel sounds active.   VS: T 97.13F, BP 145/93, HR 90, RR 16, SpO2 100% on 2LNC CBG: 89  Interventions:  No intervention   Plan of Care:  Continue current treatment plan  Event Summary:  MD Notified: Dr. Zelda Hickman Call Time: 1131 Arrival Time: 1135 End Time: 1150  Washington Hacker, RN

## 2024-03-01 NOTE — Progress Notes (Signed)
 [facility] Progress Note   Subjective: Seen in HD this morning. Patient reports that he is having some discomfort from his back. No reported dyspnea or CP today. BP is acceptable and no LLE edema or R thigh edema. On room air.   Objective Vitals:   03/01/24 1135 03/01/24 1140 03/01/24 1218 03/01/24 1247  BP: (!) 145/93 (!) 145/102 125/86 117/81  Pulse: 90 (!) 104 77 78  Resp:   17   Temp:   97.7 F (36.5 C) (!) 97.4 F (36.3 C)  TempSrc:   Oral Oral  SpO2: 100% 100% 100% 95%  Weight:      Height:       Physical Exam General: Alert, chronically- ill appearing, nad  Heart: RRR  Lungs: Clear, normal wob  Abdomen: Soft, non-tender Extremities: R BKA; + L TMA wrapped Dialysis Access: [facility]   Filed Weights   02/29/24 0500 03/01/24 0500 03/01/24 0729  Weight: 93.8 kg 87.1 kg 85 kg    Intake/Output Summary (Last 24 hours) at 03/01/2024 1256 Last data filed at 03/01/2024 1113 Gross per 24 hour  Intake 30 ml  Output 300 ml  Net -270 ml    Additional Objective Labs: Basic Metabolic Panel: Recent Labs  Lab 02/26/24 0625 02/27/24 0642 03/01/24 0748  NA 133* 140 141  K 5.0 4.9 4.3  CL 94* 99 99  CO2 25 22 22   GLUCOSE 86 88 98  BUN 43* 61* 70*  CREATININE 6.60* 8.73* 10.53*  CALCIUM  8.9 9.4 9.3  PHOS 7.1* 7.9* 8.6*   Liver Function Tests: Recent Labs  Lab 02/26/24 0625 02/27/24 0642 03/01/24 0748  ALBUMIN  3.0* 3.3* 3.5   No results for input(s): "LIPASE", "AMYLASE" in the last 168 hours. CBC: Recent Labs  Lab 02/24/24 0438 02/26/24 0625 02/27/24 0642 03/01/24 0748  WBC 6.7 5.0 5.7 4.8  NEUTROABS  --   --  3.3  --   HGB 8.9* 8.5* 9.7* 14.8  HCT 27.4* 26.5* 30.6* 46.6  MCV 106.2* 104.7* 107.4* 107.9*  PLT 172 149* 160 121*   Blood Culture    Component Value Date/Time   SDES BLOOD BLOOD RIGHT HAND AEROBIC BOTTLE ONLY 02/09/2024 0414   SPECREQUEST  02/09/2024 0414    BOTTLES DRAWN AEROBIC ONLY Blood Culture adequate volume   CULT   02/09/2024 0414    NO GROWTH 5 DAYS Performed at [facility], [address] [place], [place] [address]    REPTSTATUS 02/14/2024 FINAL 02/09/2024 0414    Cardiac Enzymes: No results for input(s): "CKTOTAL", "CKMB", "CKMBINDEX", "TROPONINI" in the last 168 hours. CBG: Recent Labs  Lab 02/29/24 1154 02/29/24 1632 02/29/24 2229 03/01/24 1135 03/01/24 1242  GLUCAP 97 106* 120* 89 95   Iron  Studies: No results for input(s): "IRON ", "TIBC", "TRANSFERRIN", "FERRITIN" in the last 72 hours. Lab Results  Component Value Date   INR 1.1 02/09/2024   INR 1.1 02/08/2024   INR 1.3 (H) 01/06/2024   Studies/Results: No results found.  Medications:  sodium chloride  10 mL/hr at 02/29/24 1700   albumin  human     ceFEPime  (MAXIPIME ) IV Stopped (02/27/24 1916)   promethazine  (PHENERGAN ) injection (IM or IVPB)     vancomycin  Stopped (02/27/24 1127)    vitamin C   1,000 mg Oral Daily   atorvastatin   80 mg Oral Daily   bethanechol   10 mg Oral BID   Chlorhexidine  Gluconate Cloth  6 each Topical Q0600   Chlorhexidine  Gluconate Cloth  6 each Topical Q0600  darbepoetin (ARANESP ) injection - DIALYSIS  100 mcg Subcutaneous Q Sat-1800   heparin  injection (subcutaneous)  5,000 Units Subcutaneous Q8H   lactulose   30 g Oral Daily   leptospermum manuka honey  1 Application Topical Daily   levothyroxine   50 mcg Oral QAC breakfast   naloxegol  oxalate  12.5 mg Oral Daily   nutrition supplement (JUVEN)  1 packet Oral BID BM   oxyCODONE   30 mg Oral Q8H   polyethylene glycol  17 g Oral BID   senna-docusate  4 tablet Oral BID   sevelamer  carbonate  1,600 mg Oral TID WC   tamsulosin   0.4 mg Oral Daily   zinc  sulfate (50mg  elemental zinc )  220 mg Oral Daily    Dialysis Orders: TTS [facility] 4h  B400  92kg  TDC  Heparin  none - Mircera 225mcg IV q 2 weeks (last 4/9) - Calcitriol  0.14mcg PO q HD  Assessment/Plan: 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 CT imaging showed T9-10  discitis. He refused MRI, area was too small for aspiration per IR. H/o of foot infection as below. Concerned that this infection may have seeded the spine. Blood Cxs were negative. Not an operative candidate per neurosurgery.  Per ID continue IV Vanc 750mg  and IV Cefepime  2gm with HD x 6 weeks thru 03/23/24 for the discitis.  Left foot osteomyelitis - completed an 8 week course of Dapto/Cefepime  on 3/28 for bacteremia presumed due to foot infection. S/p left TMA on 4/30. Antibiotics as above.  Hyperkalemia -renal diet. Improved. K 4.3 ESRD:  Usual TTS schedule. HD 5/13 HTN/volume: BPs okay, at EDW.  Has some residual UOP, euvolemic. Cramps with minimal UF -keep even  Anemia: HGB 9.7. Follow labs. On weekly ESA. No venofer in setting of infection. Metabolic bone disease: Ca ok, Phos high - resumed home binders. 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 Dispo - For SNF placement.   Thomas Lorenzo, NP [facility] 03/01/2024,12:56 PM

## 2024-03-01 NOTE — Progress Notes (Signed)
 Contacted by CSW regarding pt needing a [place] clinic due to snf placement in [place]. Referral submitted to [facility] admissions for review. Inquired of nephrologist if pt able to sit for HD. Per nephrologist, pt will need to trial HD in chair prior to d/c. Will assist as needed.   Lauraine Polite Renal Navigator [phone]

## 2024-03-01 NOTE — Plan of Care (Signed)
 Problem: Education: Goal: Ability to describe self-care measures that may prevent or decrease complications (Diabetes Survival Skills Education) will improve Outcome: Progressing Goal: Individualized Educational Video(s) Outcome: Progressing   Problem: Coping: Goal: Ability to adjust to condition or change in health will improve Outcome: Progressing   Problem: Fluid Volume: Goal: Ability to maintain a balanced intake and output will improve Outcome: Progressing   Problem: Health Behavior/Discharge Planning: Goal: Ability to identify and utilize available resources and services will improve Outcome: Progressing Goal: Ability to manage health-related needs will improve Outcome: Progressing   Problem: Metabolic: Goal: Ability to maintain appropriate glucose levels will improve Outcome: Progressing   Problem: Nutritional: Goal: Maintenance of adequate nutrition will improve Outcome: Progressing Goal: Progress toward achieving an optimal weight will improve Outcome: Progressing   Problem: Skin Integrity: Goal: Risk for impaired skin integrity will decrease Outcome: Progressing   Problem: Tissue Perfusion: Goal: Adequacy of tissue perfusion will improve Outcome: Progressing   Problem: Education: Goal: Knowledge of General Education information will improve Description: Including pain rating scale, medication(s)/side effects and non-pharmacologic comfort measures Outcome: Progressing   Problem: Health Behavior/Discharge Planning: Goal: Ability to manage health-related needs will improve Outcome: Progressing   Problem: Clinical Measurements: Goal: Ability to maintain clinical measurements within normal limits will improve Outcome: Progressing Goal: Will remain free from infection Outcome: Progressing Goal: Diagnostic test results will improve Outcome: Progressing Goal: Respiratory complications will improve Outcome: Progressing Goal: Cardiovascular complication will  be avoided Outcome: Progressing   Problem: Activity: Goal: Risk for activity intolerance will decrease Outcome: Progressing   Problem: Nutrition: Goal: Adequate nutrition will be maintained Outcome: Progressing   Problem: Coping: Goal: Level of anxiety will decrease Outcome: Progressing   Problem: Elimination: Goal: Will not experience complications related to bowel motility Outcome: Progressing Goal: Will not experience complications related to urinary retention Outcome: Progressing   Problem: Pain Managment: Goal: General experience of comfort will improve and/or be controlled Outcome: Progressing   Problem: Safety: Goal: Ability to remain free from injury will improve Outcome: Progressing   Problem: Skin Integrity: Goal: Risk for impaired skin integrity will decrease Outcome: Progressing   Problem: Education: Goal: Knowledge of the prescribed therapeutic regimen will improve Outcome: Progressing Goal: Ability to verbalize activity precautions or restrictions will improve Outcome: Progressing Goal: Understanding of discharge needs will improve Outcome: Progressing   Problem: Activity: Goal: Ability to perform//tolerate increased activity and mobilize with assistive devices will improve Outcome: Progressing   Problem: Clinical Measurements: Goal: Postoperative complications will be avoided or minimized Outcome: Progressing   Problem: Self-Care: Goal: Ability to meet self-care needs will improve Outcome: Progressing   Problem: Self-Concept: Goal: Ability to maintain and perform role responsibilities to the fullest extent possible will improve Outcome: Progressing   Problem: Pain Management: Goal: Pain level will decrease with appropriate interventions Outcome: Progressing   Problem: Skin Integrity: Goal: Demonstration of wound healing without infection will improve Outcome: Progressing   Problem: Education: Goal: Ability to describe self-care measures  that may prevent or decrease complications (Diabetes Survival Skills Education) will improve Outcome: Progressing Goal: Individualized Educational Video(s) Outcome: Progressing   Problem: Coping: Goal: Ability to adjust to condition or change in health will improve Outcome: Progressing   Problem: Fluid Volume: Goal: Ability to maintain a balanced intake and output will improve Outcome: Progressing   Problem: Health Behavior/Discharge Planning: Goal: Ability to identify and utilize available resources and services will improve Outcome: Progressing Goal: Ability to manage health-related needs will improve Outcome: Progressing   Problem: Metabolic:  Goal: Ability to maintain appropriate glucose levels will improve Outcome: Progressing   Problem: Nutritional: Goal: Maintenance of adequate nutrition will improve Outcome: Progressing Goal: Progress toward achieving an optimal weight will improve Outcome: Progressing   Problem: Skin Integrity: Goal: Risk for impaired skin integrity will decrease Outcome: Progressing   Problem: Tissue Perfusion: Goal: Adequacy of tissue perfusion will improve Outcome: Progressing   Problem: Education: Goal: Knowledge of General Education information will improve Description: Including pain rating scale, medication(s)/side effects and non-pharmacologic comfort measures Outcome: Progressing   Problem: Health Behavior/Discharge Planning: Goal: Ability to manage health-related needs will improve Outcome: Progressing   Problem: Clinical Measurements: Goal: Ability to maintain clinical measurements within normal limits will improve Outcome: Progressing Goal: Will remain free from infection Outcome: Progressing Goal: Diagnostic test results will improve Outcome: Progressing Goal: Respiratory complications will improve Outcome: Progressing Goal: Cardiovascular complication will be avoided Outcome: Progressing   Problem: Activity: Goal: Risk  for activity intolerance will decrease Outcome: Progressing   Problem: Nutrition: Goal: Adequate nutrition will be maintained Outcome: Progressing   Problem: Coping: Goal: Level of anxiety will decrease Outcome: Progressing   Problem: Elimination: Goal: Will not experience complications related to bowel motility Outcome: Progressing Goal: Will not experience complications related to urinary retention Outcome: Progressing   Problem: Pain Managment: Goal: General experience of comfort will improve and/or be controlled Outcome: Progressing   Problem: Safety: Goal: Ability to remain free from injury will improve Outcome: Progressing   Problem: Skin Integrity: Goal: Risk for impaired skin integrity will decrease Outcome: Progressing   Problem: Education: Goal: Knowledge of the prescribed therapeutic regimen will improve Outcome: Progressing Goal: Ability to verbalize activity precautions or restrictions will improve Outcome: Progressing Goal: Understanding of discharge needs will improve Outcome: Progressing   Problem: Activity: Goal: Ability to perform//tolerate increased activity and mobilize with assistive devices will improve Outcome: Progressing   Problem: Clinical Measurements: Goal: Postoperative complications will be avoided or minimized Outcome: Progressing   Problem: Self-Care: Goal: Ability to meet self-care needs will improve Outcome: Progressing   Problem: Self-Concept: Goal: Ability to maintain and perform role responsibilities to the fullest extent possible will improve Outcome: Progressing   Problem: Pain Management: Goal: Pain level will decrease with appropriate interventions Outcome: Progressing   Problem: Skin Integrity: Goal: Demonstration of wound healing without infection will improve Outcome: Progressing   Problem: Education: Goal: Ability to describe self-care measures that may prevent or decrease complications (Diabetes Survival  Skills Education) will improve Outcome: Progressing Goal: Individualized Educational Video(s) Outcome: Progressing   Problem: Coping: Goal: Ability to adjust to condition or change in health will improve Outcome: Progressing   Problem: Fluid Volume: Goal: Ability to maintain a balanced intake and output will improve Outcome: Progressing   Problem: Health Behavior/Discharge Planning: Goal: Ability to identify and utilize available resources and services will improve Outcome: Progressing Goal: Ability to manage health-related needs will improve Outcome: Progressing   Problem: Metabolic: Goal: Ability to maintain appropriate glucose levels will improve Outcome: Progressing   Problem: Nutritional: Goal: Maintenance of adequate nutrition will improve Outcome: Progressing Goal: Progress toward achieving an optimal weight will improve Outcome: Progressing   Problem: Skin Integrity: Goal: Risk for impaired skin integrity will decrease Outcome: Progressing   Problem: Tissue Perfusion: Goal: Adequacy of tissue perfusion will improve Outcome: Progressing   Problem: Education: Goal: Knowledge of General Education information will improve Description: Including pain rating scale, medication(s)/side effects and non-pharmacologic comfort measures Outcome: Progressing   Problem: Health Behavior/Discharge Planning: Goal: Ability to  manage health-related needs will improve Outcome: Progressing   Problem: Clinical Measurements: Goal: Ability to maintain clinical measurements within normal limits will improve Outcome: Progressing Goal: Will remain free from infection Outcome: Progressing Goal: Diagnostic test results will improve Outcome: Progressing Goal: Respiratory complications will improve Outcome: Progressing Goal: Cardiovascular complication will be avoided Outcome: Progressing   Problem: Activity: Goal: Risk for activity intolerance will decrease Outcome: Progressing    Problem: Nutrition: Goal: Adequate nutrition will be maintained Outcome: Progressing   Problem: Coping: Goal: Level of anxiety will decrease Outcome: Progressing   Problem: Elimination: Goal: Will not experience complications related to bowel motility Outcome: Progressing Goal: Will not experience complications related to urinary retention Outcome: Progressing   Problem: Pain Managment: Goal: General experience of comfort will improve and/or be controlled Outcome: Progressing   Problem: Safety: Goal: Ability to remain free from injury will improve Outcome: Progressing   Problem: Skin Integrity: Goal: Risk for impaired skin integrity will decrease Outcome: Progressing   Problem: Education: Goal: Knowledge of the prescribed therapeutic regimen will improve Outcome: Progressing Goal: Ability to verbalize activity precautions or restrictions will improve Outcome: Progressing Goal: Understanding of discharge needs will improve Outcome: Progressing   Problem: Activity: Goal: Ability to perform//tolerate increased activity and mobilize with assistive devices will improve Outcome: Progressing   Problem: Clinical Measurements: Goal: Postoperative complications will be avoided or minimized Outcome: Progressing   Problem: Self-Care: Goal: Ability to meet self-care needs will improve Outcome: Progressing   Problem: Self-Concept: Goal: Ability to maintain and perform role responsibilities to the fullest extent possible will improve Outcome: Progressing   Problem: Pain Management: Goal: Pain level will decrease with appropriate interventions Outcome: Progressing   Problem: Skin Integrity: Goal: Demonstration of wound healing without infection will improve Outcome: Progressing   Problem: Education: Goal: Ability to describe self-care measures that may prevent or decrease complications (Diabetes Survival Skills Education) will improve Outcome: Progressing Goal:  Individualized Educational Video(s) Outcome: Progressing   Problem: Coping: Goal: Ability to adjust to condition or change in health will improve Outcome: Progressing   Problem: Fluid Volume: Goal: Ability to maintain a balanced intake and output will improve Outcome: Progressing   Problem: Health Behavior/Discharge Planning: Goal: Ability to identify and utilize available resources and services will improve Outcome: Progressing Goal: Ability to manage health-related needs will improve Outcome: Progressing   Problem: Metabolic: Goal: Ability to maintain appropriate glucose levels will improve Outcome: Progressing   Problem: Nutritional: Goal: Maintenance of adequate nutrition will improve Outcome: Progressing Goal: Progress toward achieving an optimal weight will improve Outcome: Progressing   Problem: Skin Integrity: Goal: Risk for impaired skin integrity will decrease Outcome: Progressing   Problem: Tissue Perfusion: Goal: Adequacy of tissue perfusion will improve Outcome: Progressing   Problem: Education: Goal: Knowledge of General Education information will improve Description: Including pain rating scale, medication(s)/side effects and non-pharmacologic comfort measures Outcome: Progressing   Problem: Health Behavior/Discharge Planning: Goal: Ability to manage health-related needs will improve Outcome: Progressing   Problem: Clinical Measurements: Goal: Ability to maintain clinical measurements within normal limits will improve Outcome: Progressing Goal: Will remain free from infection Outcome: Progressing Goal: Diagnostic test results will improve Outcome: Progressing Goal: Respiratory complications will improve Outcome: Progressing Goal: Cardiovascular complication will be avoided Outcome: Progressing   Problem: Activity: Goal: Risk for activity intolerance will decrease Outcome: Progressing   Problem: Nutrition: Goal: Adequate nutrition will be  maintained Outcome: Progressing   Problem: Coping: Goal: Level of anxiety will decrease Outcome: Progressing   Problem: Elimination:  Goal: Will not experience complications related to bowel motility Outcome: Progressing Goal: Will not experience complications related to urinary retention Outcome: Progressing   Problem: Pain Managment: Goal: General experience of comfort will improve and/or be controlled Outcome: Progressing   Problem: Safety: Goal: Ability to remain free from injury will improve Outcome: Progressing   Problem: Skin Integrity: Goal: Risk for impaired skin integrity will decrease Outcome: Progressing   Problem: Education: Goal: Knowledge of the prescribed therapeutic regimen will improve Outcome: Progressing Goal: Ability to verbalize activity precautions or restrictions will improve Outcome: Progressing Goal: Understanding of discharge needs will improve Outcome: Progressing   Problem: Activity: Goal: Ability to perform//tolerate increased activity and mobilize with assistive devices will improve Outcome: Progressing   Problem: Clinical Measurements: Goal: Postoperative complications will be avoided or minimized Outcome: Progressing   Problem: Self-Care: Goal: Ability to meet self-care needs will improve Outcome: Progressing   Problem: Self-Concept: Goal: Ability to maintain and perform role responsibilities to the fullest extent possible will improve Outcome: Progressing   Problem: Pain Management: Goal: Pain level will decrease with appropriate interventions Outcome: Progressing   Problem: Skin Integrity: Goal: Demonstration of wound healing without infection will improve Outcome: Progressing   Problem: Education: Goal: Ability to describe self-care measures that may prevent or decrease complications (Diabetes Survival Skills Education) will improve Outcome: Progressing Goal: Individualized Educational Video(s) Outcome: Progressing    Problem: Coping: Goal: Ability to adjust to condition or change in health will improve Outcome: Progressing   Problem: Fluid Volume: Goal: Ability to maintain a balanced intake and output will improve Outcome: Progressing   Problem: Health Behavior/Discharge Planning: Goal: Ability to identify and utilize available resources and services will improve Outcome: Progressing Goal: Ability to manage health-related needs will improve Outcome: Progressing   Problem: Metabolic: Goal: Ability to maintain appropriate glucose levels will improve Outcome: Progressing   Problem: Nutritional: Goal: Maintenance of adequate nutrition will improve Outcome: Progressing Goal: Progress toward achieving an optimal weight will improve Outcome: Progressing   Problem: Skin Integrity: Goal: Risk for impaired skin integrity will decrease Outcome: Progressing   Problem: Tissue Perfusion: Goal: Adequacy of tissue perfusion will improve Outcome: Progressing   Problem: Education: Goal: Knowledge of General Education information will improve Description: Including pain rating scale, medication(s)/side effects and non-pharmacologic comfort measures Outcome: Progressing   Problem: Health Behavior/Discharge Planning: Goal: Ability to manage health-related needs will improve Outcome: Progressing   Problem: Clinical Measurements: Goal: Ability to maintain clinical measurements within normal limits will improve Outcome: Progressing Goal: Will remain free from infection Outcome: Progressing Goal: Diagnostic test results will improve Outcome: Progressing Goal: Respiratory complications will improve Outcome: Progressing Goal: Cardiovascular complication will be avoided Outcome: Progressing   Problem: Activity: Goal: Risk for activity intolerance will decrease Outcome: Progressing   Problem: Nutrition: Goal: Adequate nutrition will be maintained Outcome: Progressing   Problem: Coping: Goal:  Level of anxiety will decrease Outcome: Progressing   Problem: Elimination: Goal: Will not experience complications related to bowel motility Outcome: Progressing Goal: Will not experience complications related to urinary retention Outcome: Progressing   Problem: Pain Managment: Goal: General experience of comfort will improve and/or be controlled Outcome: Progressing   Problem: Safety: Goal: Ability to remain free from injury will improve Outcome: Progressing   Problem: Skin Integrity: Goal: Risk for impaired skin integrity will decrease Outcome: Progressing   Problem: Education: Goal: Knowledge of the prescribed therapeutic regimen will improve Outcome: Progressing Goal: Ability to verbalize activity precautions or restrictions will improve Outcome: Progressing Goal: Understanding  of discharge needs will improve Outcome: Progressing   Problem: Activity: Goal: Ability to perform//tolerate increased activity and mobilize with assistive devices will improve Outcome: Progressing   Problem: Clinical Measurements: Goal: Postoperative complications will be avoided or minimized Outcome: Progressing   Problem: Self-Care: Goal: Ability to meet self-care needs will improve Outcome: Progressing   Problem: Self-Concept: Goal: Ability to maintain and perform role responsibilities to the fullest extent possible will improve Outcome: Progressing   Problem: Pain Management: Goal: Pain level will decrease with appropriate interventions Outcome: Progressing   Problem: Skin Integrity: Goal: Demonstration of wound healing without infection will improve Outcome: Progressing   Problem: Education: Goal: Ability to describe self-care measures that may prevent or decrease complications (Diabetes Survival Skills Education) will improve Outcome: Progressing Goal: Individualized Educational Video(s) Outcome: Progressing   Problem: Coping: Goal: Ability to adjust to condition or  change in health will improve Outcome: Progressing   Problem: Fluid Volume: Goal: Ability to maintain a balanced intake and output will improve Outcome: Progressing   Problem: Health Behavior/Discharge Planning: Goal: Ability to identify and utilize available resources and services will improve Outcome: Progressing Goal: Ability to manage health-related needs will improve Outcome: Progressing   Problem: Metabolic: Goal: Ability to maintain appropriate glucose levels will improve Outcome: Progressing   Problem: Nutritional: Goal: Maintenance of adequate nutrition will improve Outcome: Progressing Goal: Progress toward achieving an optimal weight will improve Outcome: Progressing   Problem: Skin Integrity: Goal: Risk for impaired skin integrity will decrease Outcome: Progressing   Problem: Tissue Perfusion: Goal: Adequacy of tissue perfusion will improve Outcome: Progressing   Problem: Education: Goal: Knowledge of General Education information will improve Description: Including pain rating scale, medication(s)/side effects and non-pharmacologic comfort measures Outcome: Progressing   Problem: Health Behavior/Discharge Planning: Goal: Ability to manage health-related needs will improve Outcome: Progressing   Problem: Clinical Measurements: Goal: Ability to maintain clinical measurements within normal limits will improve Outcome: Progressing Goal: Will remain free from infection Outcome: Progressing Goal: Diagnostic test results will improve Outcome: Progressing Goal: Respiratory complications will improve Outcome: Progressing Goal: Cardiovascular complication will be avoided Outcome: Progressing   Problem: Activity: Goal: Risk for activity intolerance will decrease Outcome: Progressing   Problem: Nutrition: Goal: Adequate nutrition will be maintained Outcome: Progressing   Problem: Coping: Goal: Level of anxiety will decrease Outcome: Progressing    Problem: Elimination: Goal: Will not experience complications related to bowel motility Outcome: Progressing Goal: Will not experience complications related to urinary retention Outcome: Progressing   Problem: Pain Managment: Goal: General experience of comfort will improve and/or be controlled Outcome: Progressing   Problem: Safety: Goal: Ability to remain free from injury will improve Outcome: Progressing   Problem: Skin Integrity: Goal: Risk for impaired skin integrity will decrease Outcome: Progressing   Problem: Education: Goal: Knowledge of the prescribed therapeutic regimen will improve Outcome: Progressing Goal: Ability to verbalize activity precautions or restrictions will improve Outcome: Progressing Goal: Understanding of discharge needs will improve Outcome: Progressing   Problem: Activity: Goal: Ability to perform//tolerate increased activity and mobilize with assistive devices will improve Outcome: Progressing   Problem: Clinical Measurements: Goal: Postoperative complications will be avoided or minimized Outcome: Progressing   Problem: Self-Care: Goal: Ability to meet self-care needs will improve Outcome: Progressing   Problem: Self-Concept: Goal: Ability to maintain and perform role responsibilities to the fullest extent possible will improve Outcome: Progressing   Problem: Pain Management: Goal: Pain level will decrease with appropriate interventions Outcome: Progressing   Problem: Skin Integrity: Goal: Demonstration  of wound healing without infection will improve Outcome: Progressing   Problem: Education: Goal: Ability to describe self-care measures that may prevent or decrease complications (Diabetes Survival Skills Education) will improve Outcome: Progressing Goal: Individualized Educational Video(s) Outcome: Progressing   Problem: Coping: Goal: Ability to adjust to condition or change in health will improve Outcome: Progressing    Problem: Fluid Volume: Goal: Ability to maintain a balanced intake and output will improve Outcome: Progressing   Problem: Health Behavior/Discharge Planning: Goal: Ability to identify and utilize available resources and services will improve Outcome: Progressing Goal: Ability to manage health-related needs will improve Outcome: Progressing   Problem: Metabolic: Goal: Ability to maintain appropriate glucose levels will improve Outcome: Progressing   Problem: Nutritional: Goal: Maintenance of adequate nutrition will improve Outcome: Progressing Goal: Progress toward achieving an optimal weight will improve Outcome: Progressing   Problem: Skin Integrity: Goal: Risk for impaired skin integrity will decrease Outcome: Progressing   Problem: Tissue Perfusion: Goal: Adequacy of tissue perfusion will improve Outcome: Progressing   Problem: Education: Goal: Knowledge of General Education information will improve Description: Including pain rating scale, medication(s)/side effects and non-pharmacologic comfort measures Outcome: Progressing   Problem: Health Behavior/Discharge Planning: Goal: Ability to manage health-related needs will improve Outcome: Progressing   Problem: Clinical Measurements: Goal: Ability to maintain clinical measurements within normal limits will improve Outcome: Progressing Goal: Will remain free from infection Outcome: Progressing Goal: Diagnostic test results will improve Outcome: Progressing Goal: Respiratory complications will improve Outcome: Progressing Goal: Cardiovascular complication will be avoided Outcome: Progressing   Problem: Activity: Goal: Risk for activity intolerance will decrease Outcome: Progressing   Problem: Nutrition: Goal: Adequate nutrition will be maintained Outcome: Progressing   Problem: Coping: Goal: Level of anxiety will decrease Outcome: Progressing   Problem: Elimination: Goal: Will not experience  complications related to bowel motility Outcome: Progressing Goal: Will not experience complications related to urinary retention Outcome: Progressing   Problem: Pain Managment: Goal: General experience of comfort will improve and/or be controlled Outcome: Progressing   Problem: Safety: Goal: Ability to remain free from injury will improve Outcome: Progressing   Problem: Skin Integrity: Goal: Risk for impaired skin integrity will decrease Outcome: Progressing   Problem: Education: Goal: Knowledge of the prescribed therapeutic regimen will improve Outcome: Progressing Goal: Ability to verbalize activity precautions or restrictions will improve Outcome: Progressing Goal: Understanding of discharge needs will improve Outcome: Progressing   Problem: Activity: Goal: Ability to perform//tolerate increased activity and mobilize with assistive devices will improve Outcome: Progressing   Problem: Clinical Measurements: Goal: Postoperative complications will be avoided or minimized Outcome: Progressing   Problem: Self-Care: Goal: Ability to meet self-care needs will improve Outcome: Progressing   Problem: Self-Concept: Goal: Ability to maintain and perform role responsibilities to the fullest extent possible will improve Outcome: Progressing   Problem: Pain Management: Goal: Pain level will decrease with appropriate interventions Outcome: Progressing   Problem: Skin Integrity: Goal: Demonstration of wound healing without infection will improve Outcome: Progressing

## 2024-03-02 DIAGNOSIS — M4624 Osteomyelitis of vertebra, thoracic region: Secondary | ICD-10-CM | POA: Diagnosis not present

## 2024-03-02 LAB — GLUCOSE, CAPILLARY
Glucose-Capillary: 91 mg/dL (ref 70–99)
Glucose-Capillary: 104 mg/dL — ABNORMAL HIGH (ref 70–99)
Glucose-Capillary: 135 mg/dL — ABNORMAL HIGH (ref 70–99)
Glucose-Capillary: 101 mg/dL — ABNORMAL HIGH (ref 70–99)

## 2024-03-02 MED ORDER — HYDROMORPHONE HCL 1 MG/ML IJ SOLN
0.5000 mg | Freq: Once | INTRAMUSCULAR | Status: AC
  Administered 2024-03-03: 0.5 mg via INTRAVENOUS
  Filled 2024-03-02: qty 0.5

## 2024-03-02 MED ORDER — CHLORHEXIDINE GLUCONATE CLOTH 2 % EX PADS
6.0000 | MEDICATED_PAD | Freq: Every day | CUTANEOUS | Status: DC
  Administered 2024-03-04: 6 via TOPICAL

## 2024-03-02 NOTE — Progress Notes (Signed)
 [name] Progress Note   Subjective: Seen in room this morning. Patient reports that he is having some discomfort from his back. No reported dyspnea or CP today. BP is acceptable and no LLE edema or R thigh edema. On room air. We discussed that he will need to be able to sit up in chair for next HD and he expressed his concern about his pain. Discussed with hospitalist and he is okay to give 1 dose of dilaudid  during HD. I told him that he would not be able to receive dilaudid  at OP HD.   Objective Vitals:   03/01/24 2352 03/02/24 0346 03/02/24 0800 03/02/24 1152  BP: 121/81 123/77 104/75 114/77  Pulse: 83 81 89 80  Resp:  17 18 16   Temp: 98.7 F (37.1 C) 98.4 F (36.9 C) 98 F (36.7 C) 98.5 F (36.9 C)  TempSrc: Oral Oral Oral Oral  SpO2:   95% 96%  Weight:      Height:       Physical Exam General: Alert, chronically- ill appearing, nad  Heart: RRR  Lungs: Clear, normal wob  Abdomen: Soft, non-tender Extremities: R BKA; + L TMA wrapped Dialysis Access: [facility]   Filed Weights   02/29/24 0500 03/01/24 0500 03/01/24 0729  Weight: 93.8 kg 87.1 kg 85 kg    Intake/Output Summary (Last 24 hours) at 03/02/2024 1501 Last data filed at 03/02/2024 1040 Gross per 24 hour  Intake 840 ml  Output --  Net 840 ml    Additional Objective Labs: Basic Metabolic Panel: Recent Labs  Lab 02/26/24 0625 02/27/24 0642 03/01/24 0748  NA 133* 140 141  K 5.0 4.9 4.3  CL 94* 99 99  CO2 25 22 22   GLUCOSE 86 88 98  BUN 43* 61* 70*  CREATININE 6.60* 8.73* 10.53*  CALCIUM  8.9 9.4 9.3  PHOS 7.1* 7.9* 8.6*   Liver Function Tests: Recent Labs  Lab 02/26/24 0625 02/27/24 0642 03/01/24 0748  ALBUMIN  3.0* 3.3* 3.5   No results for input(s): "LIPASE", "AMYLASE" in the last 168 hours. CBC: Recent Labs  Lab 02/26/24 0625 02/27/24 0642 03/01/24 0748  WBC 5.0 5.7 4.8  NEUTROABS  --  3.3  --   HGB 8.5* 9.7* 14.8  HCT 26.5* 30.6* 46.6  MCV 104.7* 107.4* 107.9*  PLT  149* 160 121*   Blood Culture    Component Value Date/Time   SDES BLOOD BLOOD RIGHT HAND AEROBIC BOTTLE ONLY 02/09/2024 0414   SPECREQUEST  02/09/2024 0414    BOTTLES DRAWN AEROBIC ONLY Blood Culture adequate volume   CULT  02/09/2024 0414    NO GROWTH 5 DAYS Performed at [facility], [address] [place], [place] [address]    REPTSTATUS 02/14/2024 FINAL 02/09/2024 0414    Cardiac Enzymes: No results for input(s): "CKTOTAL", "CKMB", "CKMBINDEX", "TROPONINI" in the last 168 hours. CBG: Recent Labs  Lab 03/01/24 1242 03/01/24 1619 03/01/24 1941 03/02/24 0806 03/02/24 1151  GLUCAP 95 92 93 91 104*   Iron  Studies: No results for input(s): "IRON ", "TIBC", "TRANSFERRIN", "FERRITIN" in the last 72 hours. Lab Results  Component Value Date   INR 1.1 02/09/2024   INR 1.1 02/08/2024   INR 1.3 (H) 01/06/2024   Studies/Results: No results found.  Medications:  sodium chloride  10 mL/hr at 02/29/24 1700   albumin  human     ceFEPime  (MAXIPIME ) IV 2 g (03/01/24 2029)   promethazine  (PHENERGAN ) injection (IM or IVPB)     vancomycin  750 mg (03/01/24 2112)  vitamin C   1,000 mg Oral Daily   atorvastatin   80 mg Oral Daily   bethanechol   10 mg Oral BID   Chlorhexidine  Gluconate Cloth  6 each Topical Q0600   Chlorhexidine  Gluconate Cloth  6 each Topical Q0600   darbepoetin (ARANESP ) injection - DIALYSIS  100 mcg Subcutaneous Q Sat-1800   heparin  injection (subcutaneous)  5,000 Units Subcutaneous Q8H   lactulose   30 g Oral Daily   leptospermum manuka honey  1 Application Topical Daily   levothyroxine   50 mcg Oral QAC breakfast   naloxegol  oxalate  12.5 mg Oral Daily   nutrition supplement (JUVEN)  1 packet Oral BID BM   oxyCODONE   30 mg Oral Q8H   polyethylene glycol  17 g Oral BID   senna-docusate  4 tablet Oral BID   sevelamer  carbonate  1,600 mg Oral TID WC   tamsulosin   0.4 mg Oral Daily    Dialysis Orders: TTS [place] 4h  B400  92kg  TDC  Heparin   none - Mircera 225mcg IV q 2 weeks (last 4/9) - Calcitriol  0.17mcg PO q HD  Assessment/Plan: 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 CT imaging showed T9-10 discitis. He refused MRI, area was too small for aspiration per IR. H/o of foot infection as below. Concerned that this infection may have seeded the spine. Blood Cxs were negative. Not an operative candidate per neurosurgery.  Per ID continue IV Vanc 750mg  and IV Cefepime  2gm with HD x 6 weeks thru 03/23/24 for the discitis.  Left foot osteomyelitis - completed an 8 week course of Dapto/Cefepime  on 3/28 for bacteremia presumed due to foot infection. S/p left TMA on 4/30. Antibiotics as above.  Hyperkalemia -renal diet. Improved. K 4.3 ESRD:  Usual TTS schedule. HD 5/15 HTN/volume: BPs okay, at EDW.  Has some residual UOP, euvolemic. Cramps with minimal UF -keep even  Anemia: HGB 14.1. Follow labs. Hold weekly ESA. No venofer in setting of infection. Metabolic bone disease: Ca ok, Phos high - resumed home binders. 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 Dispo - For SNF placement.   Hersey Lorenzo, NP [facility] 03/02/2024,3:01 PM

## 2024-03-02 NOTE — Plan of Care (Signed)
  Problem: Education: Goal: Ability to describe self-care measures that may prevent or decrease complications (Diabetes Survival Skills Education) will improve Outcome: Progressing   Problem: Coping: Goal: Ability to adjust to condition or change in health will improve Outcome: Progressing   Problem: Fluid Volume: Goal: Ability to maintain a balanced intake and output will improve Outcome: Progressing   Problem: Health Behavior/Discharge Planning: Goal: Ability to identify and utilize available resources and services will improve Outcome: Progressing   Problem: Metabolic: Goal: Ability to maintain appropriate glucose levels will improve Outcome: Progressing   Problem: Nutritional: Goal: Maintenance of adequate nutrition will improve Outcome: Progressing   Problem: Skin Integrity: Goal: Risk for impaired skin integrity will decrease Outcome: Progressing   Problem: Tissue Perfusion: Goal: Adequacy of tissue perfusion will improve Outcome: Progressing   

## 2024-03-02 NOTE — Progress Notes (Signed)
 Contacted CSW to inquire if snf would be able to do a TTS 1st shift at [facility] Clinic does not have a MWF available. Will await response from snf. Noted plans for pt to trial HD in chair tomorrow. Will assist as needed.   Lauraine Polite Renal Navigator [phone]

## 2024-03-02 NOTE — Progress Notes (Signed)
 Occupational Therapy Treatment Patient Details Name: [name] MRN: [id number] DOB: 10/14/1951 Today's Date: 03/02/2024   History of present illness The pt is a 73 yo male presenting 4/20 with back pain after a fall at home. 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 Pt agreeable to sit EOB, scoot transfer to chair, but adamantly declined to stay up in chair to eat lunch. Pt required Sup to sit EOB, participated in grooming/hygiene tasks with Sup, UB dressing CGA and scoot transfer to chair and back to bed min A. Pt reports back pain as reason as to not being able to tolerate sitting in recliner >1 hour but still declined sitting in chair for 1 hour while eating lunch. OT will continue to follow acutely to maximize level of function and safety      If plan is discharge home, recommend the following:  A lot of help with walking and/or transfers;A lot of help with bathing/dressing/bathroom;Assist for transportation;Assistance with cooking/housework   Equipment Recommendations  Hospital bed    Recommendations for Other Services      Precautions / Restrictions Precautions Precautions: Back;Fall Recall of Precautions/Restrictions: Intact Required Braces or Orthoses: Spinal Brace;Other Brace Spinal Brace: Thoracolumbosacral orthotic;Applied in sitting position Other Brace: Post op shoe Restrictions Weight Bearing Restrictions Per Provider Order: No LLE Weight Bearing Per Provider Order: Touchdown weight bearing Other Position/Activity Restrictions: Post op shoe       Mobility Bed Mobility Overal bed mobility: Needs Assistance Bed Mobility: Rolling,  Sidelying to Sit, Sit to Sidelying Rolling: Supervision, Used rails Sidelying to sit: Supervision     Sit to sidelying: Supervision General bed mobility comments: Supervision for safety, cues for techniques with log roll, declined OOB activity due to back pain    Transfers Overall transfer level: Needs assistance Equipment used: None Transfers: Bed to chair/wheelchair/BSC            Lateral/Scoot Transfers: Min assist General transfer comment: declined staying up in chair to eat his lunch due to back pain, wanted to go right back to bed     Balance Overall balance assessment: History of Falls, Needs assistance Sitting-balance support: No upper extremity supported Sitting balance-[name]: Fair                                     ADL either performed or assessed with clinical judgement   ADL Overall ADL's : Needs assistance/impaired     Grooming: Wash/dry hands;Wash/dry face;Set up;Sitting           Upper Body Dressing : Contact guard assist;Sitting       Toilet Transfer: Minimal assistance;Cueing for safety Toilet Transfer Details (indicate cue type and reason): simulated by laterlal scoot to drop arm recliner         Functional mobility during ADLs: Minimal assistance General ADL Comments: pt deferred sitting up in recliner to eat lunch    Extremity/Trunk Assessment Upper Extremity Assessment Upper Extremity Assessment: Generalized weakness   Lower Extremity Assessment Lower Extremity Assessment: Defer to PT evaluation   Cervical / Trunk Assessment Cervical / Trunk Assessment: Kyphotic    Vision Ability to See  in Adequate Light: 0 Adequate Patient Visual Report: No change from baseline     Perception     Praxis     Communication Communication Communication: No apparent difficulties   Cognition Arousal: Alert Behavior During Therapy: WFL for tasks assessed/performed                                 Following  commands: Intact        Cueing   Cueing Techniques: Verbal cues  Exercises      Shoulder Instructions       General Comments      Pertinent Vitals/ Pain       Pain Assessment Pain Assessment: Faces Faces Pain Scale: Hurts whole lot Pain Location: back Pain Descriptors / Indicators: Aching Pain Intervention(s): Monitored during session, Repositioned, Limited activity within patient's tolerance  Home Living                                          Prior Functioning/Environment              Frequency           Progress Toward Goals  OT Goals(current goals can now be found in the care plan section)  Progress towards OT goals: Progressing toward goals     Plan      Co-evaluation                 AM-PAC OT "6 Clicks" Daily Activity     Outcome Measure   Help from another person eating meals?: None Help from another person taking care of personal grooming?: A Little Help from another person toileting, which includes using toliet, bedpan, or urinal?: A Lot Help from another person bathing (including washing, rinsing, drying)?: A Lot Help from another person to put on and taking off regular upper body clothing?: A Little Help from another person to put on and taking off regular lower body clothing?: A Lot 6 Click Score: 16    End of Session Equipment Utilized During Treatment: Gait belt  OT Visit Diagnosis: Unsteadiness on feet (R26.81);Repeated falls (R29.6);History of falling (Z91.81);Muscle weakness (generalized) (M62.81);Pain Pain - part of body:  (back)   Activity Tolerance Patient limited by pain   Patient Left in bed;with call bell/phone within reach;with bed alarm set   Nurse Communication          Time: 8295-6213 OT Time Calculation (min): 19 min  Charges: OT General Charges $OT Visit: 1 Visit OT Treatments $Therapeutic Activity: 8-22 mins    [name] 03/02/2024, 1:47 PM

## 2024-03-02 NOTE — Progress Notes (Signed)
 PROGRESS NOTE        PATIENT DETAILS Name: Thomas [name] Age: 73 y.o. Sex: male Date of Birth: 10-18-1951 Admit Date: 02/07/2024 Admitting Physician Roxana Copier, DO WRU:EAVWUJ, [name], MD  Brief Summary:  Patient is a 73 y.o.  male who with history of ESRD on HD TTS, history of posterior fusion at T10-12 due to osteomyelitis/discitis/epidural abscess in 2021, history of right BKA who recently completed 6 weeks of daptomycin /cefepime  on 3/28 for left foot infection with osteomyelitis-presented with low back pain-upon further evaluation with CT imaging-he was found to have acute osteomyelitis involving his thoracic spine and admitted to the hospitalist service.  Significant events: 4/20>> admit to TRH-back pain-concern for thoracic osteomyelitis.  Significant studies: 4/21>> x-ray right elbow: No fracture. 4/21>> x-ray left wrist: No fracture 4/21>> x-ray left shoulder, no fracture. 4/21>> CT head: No acute intracranial abnormality 4/21>> CT C-spine: No fracture. 4/21>> CT abdomen/pelvis: No acute injury to chest/abdomen/pelvis 4/21>> CT T-spine: Prior posterior fusion from T10-T12-abnormal appearance with collapse of T10-lucency around T10 screws at the T9-10 disc space, T10-11 disc space concerning for discitis/osteomyelitis. 4/21>> CT L-spine: No acute bony abnormality, mild chronic compression fracture at L1. 4/22>> x-ray left foot: Chronic erosive changes about the forefoot similar to 11/21/2023-osteomyelitis/septic arthritis of the 1st/2nd MTP joints difficult to exclude. 4/25>> left ABI: Within normal limit.  Significant microbiology data: 4/22>> blood culture: No growth  Procedures: 4/30>> left transmetatarsal amputation by Dr. Julio Ohm.  Consults: Neurosurgery Infectious disease Ortho  Subjective:    Patient in bed, comfortable, eating breakfast and watching TV, no apparent distress.   Objective: Vitals: Blood pressure 114/77, pulse 80,  temperature 98.5 F (36.9 C), temperature source Oral, resp. rate 16, height 6\' 5"  (1.956 m), weight 85 kg, SpO2 96%.   Exam:  Awake Alert, Oriented X 3, well, no apparent distress Symmetrical Chest wall movement, Good air movement bilaterally, CTAB RRR,No Gallops,Rubs or new Murmurs, No Parasternal Heave +ve B.Sounds, Abd Soft, No tenderness, No rebound - guarding or rigidity. Extremities: Left TMA dressing in place, s/p chronic right AKA.   Assessment/Plan:  Acute osteomyelitis with discitis involving T9 to T11 vertebra Per neurosurgery not an operative candidate Per IR-no window to aspirate Plan is for 6 weeks of vancomycin /cefepime  with hemodialysis-EOT 03/23/24 Blood cultures negative so far Back pain better on long and short acting Oxy codon, stop IV Dilaudid , he is in no distress and always found sleeping comfortably Continue to wear TLSO brace when out of the bed. Note patient is on high doses of narcotics chronically, exhibits some narcotic seeking behavior, counseled to avoid excessive use to avoid accidental overdose, as needed Narcan  on board.  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 Chronic left second toe ulceration He just completed 6 weeks of daptomycin /cefepime  on 3/28-has refused amputation in the past.  Concern is that he may have seeded his spine through foot infection.  Subsequently evaluated by Dr. Julio Ohm on 4/28-underwent TMA on 4/30.  Toe-touch weightbearing on that leg.  Transient hypotension on 4/30 Now stable on low-dose Norvasc , clonidine /losartan /metoprolol  remains on hold-continue to monitor and add as appropriate.  Constipation Chronic issue-secondary to narcotics Finally had a BM after several days-remains on MiraLAX /senna-Movantik  added on 5/2. Dulcolax suppository as needed.  Mechanical fall Numerous imaging studies negative for fracture PT/OT eval  ESRD on HD TTS Nephrology following and directing HD care  Hyperkalemia on  02/20/2024 He underwent HD 02/20/2024 afternoon, 2 doses of Lokelma  on 02/22/2024, defer to nephrology  Prolonged QTc Twelve-lead EKG on 4/30 with normal QTc Telemetry monitoring  Normocytic anemia Secondary to ESRD Aranesp /iron  deferred to nephrology service.  Chronic HFpEF Euvolemic on exam Fluid removal with HD  HTN See above regarding hypotension BP relatively stable-see above discussion.  HLD Statin  Hypothyroidism Synthroid   BPH Flomax /bethanechol   History of right BKA  Debility/deconditioning For SNF placement, will need clear process as will need to be placed in [place], renal to attempt HD setting up tomorrow.  BMI: Estimated body mass index is 22.22 kg/m as calculated from the following:   Height as of this encounter: 6\' 5"  (1.956 m).   Weight as of this encounter: 85 kg.   Code status:   Code Status: Full Code   DVT Prophylaxis: SCD's Start: 02/17/24 1538 heparin  injection 5,000 Units Start: 02/10/24 1400 SCDs Start: 02/08/24 2206   Family Communication: Discussed with the patient, none at bedside today   Disposition Plan: Status is: Inpatient Remains inpatient appropriate because: Severity of illness   Planned Discharge Destination: SNF   Diet: Diet Order             Diet renal with fluid restriction Fluid restriction: 1200 mL Fluid; Room service appropriate? Yes; Fluid consistency: Thin  Diet effective now                    MEDICATIONS: Scheduled Meds:  vitamin C   1,000 mg Oral Daily   atorvastatin   80 mg Oral Daily   bethanechol   10 mg Oral BID   Chlorhexidine  Gluconate Cloth  6 each Topical Q0600   Chlorhexidine  Gluconate Cloth  6 each Topical Q0600   darbepoetin (ARANESP ) injection - DIALYSIS  100 mcg Subcutaneous Q Sat-1800   heparin  injection (subcutaneous)  5,000 Units Subcutaneous Q8H   lactulose   30 g Oral Daily   leptospermum manuka honey  1 Application Topical Daily   levothyroxine   50 mcg Oral QAC breakfast    naloxegol  oxalate  12.5 mg Oral Daily   nutrition supplement (JUVEN)  1 packet Oral BID BM   oxyCODONE   30 mg Oral Q8H   polyethylene glycol  17 g Oral BID   senna-docusate  4 tablet Oral BID   sevelamer  carbonate  1,600 mg Oral TID WC   tamsulosin   0.4 mg Oral Daily   Continuous Infusions:  sodium chloride  10 mL/hr at 02/29/24 1700   albumin  human     ceFEPime  (MAXIPIME ) IV 2 g (03/01/24 2029)   promethazine  (PHENERGAN ) injection (IM or IVPB)     vancomycin  750 mg (03/01/24 2112)   PRN Meds:.acetaminophen  **OR** acetaminophen , bisacodyl , melatonin, naLOXone  (NARCAN )  injection, ondansetron  (ZOFRAN ) IV, oxyCODONE , promethazine  (PHENERGAN ) injection (IM or IVPB), tiZANidine    I have personally reviewed following labs and imaging studies  LABORATORY DATA: CBC: Recent Labs  Lab 02/26/24 0625 02/27/24 0642 03/01/24 0748  WBC 5.0 5.7 4.8  NEUTROABS  --  3.3  --   HGB 8.5* 9.7* 14.8  HCT 26.5* 30.6* 46.6  MCV 104.7* 107.4* 107.9*  PLT 149* 160 121*    Basic Metabolic Panel: Recent Labs  Lab 02/25/24 0502 02/26/24 0625 02/27/24 0642 03/01/24 0748  NA 137 133* 140 141  K 4.9 5.0 4.9 4.3  CL 98 94* 99 99  CO2 23 25 22 22   GLUCOSE 97 86 88 98  BUN 73* 43* 61* 70*  CREATININE 8.83* 6.60* 8.73* 10.53*  CALCIUM  8.9 8.9 9.4 9.3  PHOS 8.5* 7.1* 7.9* 8.6*    GFR:  Estimated Creatinine Clearance: 7.8 mL/min (A) (by C-G formula based on SCr of 10.53 mg/dL (H)).  Liver Function Tests: Recent Labs  Lab 02/25/24 0502 02/26/24 0625 02/27/24 0642 03/01/24 0748  ALBUMIN  3.2* 3.0* 3.3* 3.5    Urine analysis:     Component Value Date/Time   COLORURINE YELLOW 01/14/2022 0248   APPEARANCEUR HAZY (A) 01/14/2022 0248   LABSPEC 1.009 01/14/2022 0248   PHURINE 7.0 01/14/2022 0248   GLUCOSEU NEGATIVE 01/14/2022 0248   HGBUR MODERATE (A) 01/14/2022 0248   BILIRUBINUR NEGATIVE 01/14/2022 0248   KETONESUR NEGATIVE 01/14/2022 0248   PROTEINUR 100 (A) 01/14/2022 0248    NITRITE NEGATIVE 01/14/2022 0248   LEUKOCYTESUR LARGE (A) 01/14/2022 0248    Sepsis Labs:  Lactic Acid, Venous    Component Value Date/Time   LATICACIDVEN 1.0 12/31/2021 0048    LOS: 23 days   Signature  -    Seena Dadds M.D on 03/02/2024 at 1:45 PM   -  To page go to [email or web address]

## 2024-03-02 NOTE — Plan of Care (Signed)
  Problem: Education: Goal: Ability to describe self-care measures that may prevent or decrease complications (Diabetes Survival Skills Education) will improve Outcome: Progressing Goal: Individualized Educational Video(s) Outcome: Progressing   Problem: Coping: Goal: Ability to adjust to condition or change in health will improve Outcome: Progressing   Problem: Fluid Volume: Goal: Ability to maintain a balanced intake and output will improve Outcome: Progressing   Problem: Health Behavior/Discharge Planning: Goal: Ability to identify and utilize available resources and services will improve Outcome: Progressing Goal: Ability to manage health-related needs will improve Outcome: Progressing   Problem: Metabolic: Goal: Ability to maintain appropriate glucose levels will improve Outcome: Progressing   Problem: Nutritional: Goal: Maintenance of adequate nutrition will improve Outcome: Progressing Goal: Progress toward achieving an optimal weight will improve Outcome: Progressing   Problem: Skin Integrity: Goal: Risk for impaired skin integrity will decrease Outcome: Progressing   Problem: Tissue Perfusion: Goal: Adequacy of tissue perfusion will improve Outcome: Progressing   Problem: Education: Goal: Knowledge of General Education information will improve Description: Including pain rating scale, medication(s)/side effects and non-pharmacologic comfort measures Outcome: Progressing   Problem: Health Behavior/Discharge Planning: Goal: Ability to manage health-related needs will improve Outcome: Progressing   Problem: Clinical Measurements: Goal: Ability to maintain clinical measurements within normal limits will improve Outcome: Progressing Goal: Will remain free from infection Outcome: Progressing Goal: Diagnostic test results will improve Outcome: Progressing Goal: Respiratory complications will improve Outcome: Progressing Goal: Cardiovascular complication will  be avoided Outcome: Progressing   Problem: Activity: Goal: Risk for activity intolerance will decrease Outcome: Progressing   Problem: Nutrition: Goal: Adequate nutrition will be maintained Outcome: Progressing   Problem: Coping: Goal: Level of anxiety will decrease Outcome: Progressing   Problem: Elimination: Goal: Will not experience complications related to bowel motility Outcome: Progressing Goal: Will not experience complications related to urinary retention Outcome: Progressing   Problem: Pain Managment: Goal: General experience of comfort will improve and/or be controlled Outcome: Progressing   Problem: Safety: Goal: Ability to remain free from injury will improve Outcome: Progressing   Problem: Skin Integrity: Goal: Risk for impaired skin integrity will decrease Outcome: Progressing   Problem: Education: Goal: Knowledge of the prescribed therapeutic regimen will improve Outcome: Progressing Goal: Ability to verbalize activity precautions or restrictions will improve Outcome: Progressing Goal: Understanding of discharge needs will improve Outcome: Progressing   Problem: Activity: Goal: Ability to perform//tolerate increased activity and mobilize with assistive devices will improve Outcome: Progressing   Problem: Clinical Measurements: Goal: Postoperative complications will be avoided or minimized Outcome: Progressing   Problem: Self-Care: Goal: Ability to meet self-care needs will improve Outcome: Progressing   Problem: Self-Concept: Goal: Ability to maintain and perform role responsibilities to the fullest extent possible will improve Outcome: Progressing   Problem: Pain Management: Goal: Pain level will decrease with appropriate interventions Outcome: Progressing   Problem: Skin Integrity: Goal: Demonstration of wound healing without infection will improve Outcome: Progressing

## 2024-03-02 NOTE — Progress Notes (Signed)
 PT Cancellation Note  Patient Details Name: Thomas [name] MRN: [id number] DOB: April 13, 1951   Cancelled Treatment:    Reason Eval/Treat Not Completed: Patient declined. Reason: tired, in pain, got up earlier. Encouraged OOB several times per day, offered to help work on positioning for comfort. Declines.   Jory Ng, PT, DPT [facility] Services Physical Therapist Office: [phone] Website: [email or web address]    [name] 03/02/2024, 3:12 PM

## 2024-03-03 DIAGNOSIS — M4624 Osteomyelitis of vertebra, thoracic region: Secondary | ICD-10-CM | POA: Diagnosis not present

## 2024-03-03 LAB — GLUCOSE, CAPILLARY
Glucose-Capillary: 96 mg/dL (ref 70–99)
Glucose-Capillary: 112 mg/dL — ABNORMAL HIGH (ref 70–99)
Glucose-Capillary: 113 mg/dL — ABNORMAL HIGH (ref 70–99)

## 2024-03-03 MED ORDER — HYDROMORPHONE HCL 1 MG/ML IJ SOLN
INTRAMUSCULAR | Status: AC
Start: 2024-03-03 — End: ?
  Filled 2024-03-03: qty 0.5

## 2024-03-03 MED ORDER — HEPARIN SODIUM (PORCINE) 1000 UNIT/ML DIALYSIS: 1000.0000 [IU] | INTRAMUSCULAR | Status: DC | PRN

## 2024-03-03 MED ORDER — HYDROMORPHONE HCL 1 MG/ML IJ SOLN
0.5000 mg | Freq: Every day | INTRAMUSCULAR | Status: DC | PRN
  Administered 2024-03-03: 0.5 mg via INTRAVENOUS

## 2024-03-03 MED ORDER — ALTEPLASE 2 MG IJ SOLR: 2.0000 mg | Freq: Once | INTRAMUSCULAR | Status: DC | PRN

## 2024-03-03 MED ORDER — PENTAFLUOROPROP-TETRAFLUOROETH EX AERO: 1.0000 | INHALATION_SPRAY | CUTANEOUS | Status: DC | PRN

## 2024-03-03 MED ORDER — HEPARIN SODIUM (PORCINE) 1000 UNIT/ML IJ SOLN
INTRAMUSCULAR | Status: AC
  Filled 2024-03-03: qty 2

## 2024-03-03 MED ORDER — LIDOCAINE HCL (PF) 1 % IJ SOLN: 5.0000 mL | INTRAMUSCULAR | Status: DC | PRN

## 2024-03-03 MED ORDER — ANTICOAGULANT SODIUM CITRATE 4% (200MG/5ML) IV SOLN
5.0000 mL | Status: DC | PRN
  Filled 2024-03-03: qty 5

## 2024-03-03 MED ORDER — MIDODRINE HCL 5 MG PO TABS
10.0000 mg | ORAL_TABLET | Freq: Two times a day (BID) | ORAL | Status: DC
  Administered 2024-03-03 – 2024-03-09 (×13): 10 mg via ORAL
  Filled 2024-03-03 (×13): qty 2

## 2024-03-03 MED ORDER — GLYCOPYRROLATE 0.2 MG/ML IJ SOLN
0.1000 mg | Freq: Once | INTRAMUSCULAR | Status: AC
  Administered 2024-03-03: 0.1 mg via INTRAVENOUS
  Filled 2024-03-03: qty 1

## 2024-03-03 MED ORDER — LIDOCAINE-PRILOCAINE 2.5-2.5 % EX CREA: 1.0000 | TOPICAL_CREAM | CUTANEOUS | Status: DC | PRN

## 2024-03-03 NOTE — Progress Notes (Signed)
 [facility] Progress Note   Subjective: Seen in room this morning. Patient very concerned that he would not be able to tolerate HD in the chair today without "multiple shots" which he is referring to IV pain medication. Dilaudid  ordered today, but we discussed that he will not be able to receive IV pain medication in an outpatient setting. He said that he knows. BP was softer today. Patient on midodrine  for hypotension.   Objective Vitals:   03/03/24 1054 03/03/24 1108 03/03/24 1130 03/03/24 1200  BP: 110/73 (!) 102/57 (!) 79/59 102/62  Pulse: 89 84 84 87  Resp: 10 15 17 17   Temp: 98 F (36.7 C)     TempSrc:      SpO2: 100% 100% 99% 100%  Weight:      Height:       Physical Exam General: Alert, chronically- ill appearing, nad  Heart: RRR  Lungs: Clear, normal wob  Abdomen: Soft, non-tender Extremities: R BKA; + L TMA wrapped Dialysis Access: [facility]   Filed Weights   03/01/24 0500 03/01/24 0729 03/03/24 0500  Weight: 87.1 kg 85 kg 85.4 kg    Intake/Output Summary (Last 24 hours) at 03/03/2024 1234 Last data filed at 03/03/2024 1000 Gross per 24 hour  Intake 1090 ml  Output --  Net 1090 ml    Additional Objective Labs: Basic Metabolic Panel: Recent Labs  Lab 02/26/24 0625 02/27/24 0642 03/01/24 0748  NA 133* 140 141  K 5.0 4.9 4.3  CL 94* 99 99  CO2 25 22 22   GLUCOSE 86 88 98  BUN 43* 61* 70*  CREATININE 6.60* 8.73* 10.53*  CALCIUM  8.9 9.4 9.3  PHOS 7.1* 7.9* 8.6*   Liver Function Tests: Recent Labs  Lab 02/26/24 0625 02/27/24 0642 03/01/24 0748  ALBUMIN  3.0* 3.3* 3.5   No results for input(s): "LIPASE", "AMYLASE" in the last 168 hours. CBC: Recent Labs  Lab 02/26/24 0625 02/27/24 0642 03/01/24 0748  WBC 5.0 5.7 4.8  NEUTROABS  --  3.3  --   HGB 8.5* 9.7* 14.8  HCT 26.5* 30.6* 46.6  MCV 104.7* 107.4* 107.9*  PLT 149* 160 121*   Blood Culture    Component Value Date/Time   SDES BLOOD BLOOD RIGHT HAND AEROBIC BOTTLE ONLY  02/09/2024 0414   SPECREQUEST  02/09/2024 0414    BOTTLES DRAWN AEROBIC ONLY Blood Culture adequate volume   CULT  02/09/2024 0414    NO GROWTH 5 DAYS Performed at [facility], [address] [place], [place] [address]    REPTSTATUS 02/14/2024 FINAL 02/09/2024 0414    Cardiac Enzymes: No results for input(s): "CKTOTAL", "CKMB", "CKMBINDEX", "TROPONINI" in the last 168 hours. CBG: Recent Labs  Lab 03/02/24 0806 03/02/24 1151 03/02/24 1547 03/02/24 2113 03/03/24 0734  GLUCAP 91 104* 135* 101* 96   Iron  Studies: No results for input(s): "IRON ", "TIBC", "TRANSFERRIN", "FERRITIN" in the last 72 hours. Lab Results  Component Value Date   INR 1.1 02/09/2024   INR 1.1 02/08/2024   INR 1.3 (H) 01/06/2024   Studies/Results: No results found.  Medications:  sodium chloride  10 mL/hr at 02/29/24 1700   albumin  human     anticoagulant sodium citrate      ceFEPime  (MAXIPIME ) IV 2 g (03/01/24 2029)   promethazine  (PHENERGAN ) injection (IM or IVPB) Stopped (03/02/24 2156)   vancomycin  750 mg (03/01/24 2112)    vitamin C   1,000 mg Oral Daily   atorvastatin   80 mg Oral Daily   bethanechol   10  mg Oral BID   Chlorhexidine  Gluconate Cloth  6 each Topical Q0600   Chlorhexidine  Gluconate Cloth  6 each Topical Q0600   Chlorhexidine  Gluconate Cloth  6 each Topical Q0600   darbepoetin (ARANESP ) injection - DIALYSIS  100 mcg Subcutaneous Q Sat-1800   heparin  injection (subcutaneous)  5,000 Units Subcutaneous Q8H   lactulose   30 g Oral Daily   leptospermum manuka honey  1 Application Topical Daily   levothyroxine   50 mcg Oral QAC breakfast   midodrine   10 mg Oral BID WC   naloxegol  oxalate  12.5 mg Oral Daily   nutrition supplement (JUVEN)  1 packet Oral BID BM   oxyCODONE   30 mg Oral Q8H   polyethylene glycol  17 g Oral BID   senna-docusate  4 tablet Oral BID   sevelamer  carbonate  1,600 mg Oral TID WC   tamsulosin   0.4 mg Oral Daily    Dialysis Orders: TTS [facility]h   B400  92kg  TDC  Heparin  none - Mircera 225mcg IV q 2 weeks (last 4/9) - Calcitriol  0.56mcg PO q HD  Assessment/Plan: 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 CT imaging showed T9-10 discitis. He refused MRI, area was too small for aspiration per IR. H/o of foot infection as below. Concerned that this infection may have seeded the spine. Blood Cxs were negative. Not an operative candidate per neurosurgery.  Per ID continue IV Vanc 750mg  and IV Cefepime  2gm with HD x 6 weeks thru 03/23/24 for the discitis.  Left foot osteomyelitis - completed an 8 week course of Dapto/Cefepime  on 3/28 for bacteremia presumed due to foot infection. S/p left TMA on 4/30. Antibiotics as above.  Hyperkalemia -renal diet. Improved. K 4.3 ESRD:  Usual TTS schedule. HD 5/15 HTN/volume: BPs okay, at EDW.  Has some residual UOP, euvolemic. Cramps with minimal UF -keep even  Anemia: HGB 14.1. Follow labs. Hold weekly ESA. No venofer in setting of infection. Metabolic bone disease: Ca ok, Phos high - resumed home binders. 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 Dispo - For SNF placement.  Chronic pain: he has PRN IV pain medication for this treatment only. He will have to be able to tolerate only oral pain medication for his future treatments.   Hersey Lorenzo, NP [facility] 03/03/2024,12:34 PM

## 2024-03-03 NOTE — Progress Notes (Signed)
 Pt constantly drooling due to preferential positioning. Pt gown and pad/towel and linens changed frequently. Pt willing to try medication to help stop excessive drooling. On call Dr. Brock Canner contacted and ordered one time dose of robinul. Pt proceeded to fall asleep shortly after administration. Medication successful as pt had not drooled for 2 hours while he was asleep. Pt excited for medication.  Pt frequently falls asleep and drools while doing so.  Pt requests IV pain medicine but is frequently drowsy with BP staying on low side. Pt informed he has scheduled oxycontin  and prn oxcodone. Pt utilized prn before bedtime and did not request again. Pt is very forgetful and unable to recall when he had pain medication last. Pt educated frequently on medication schedule.

## 2024-03-03 NOTE — TOC Progression Note (Signed)
 Transition of Care [facility]) - Progression Note    Patient Details  Name: Thomas [name] MRN: [id number] Date of Birth: Mar 07, 1951  Transition [facility]) CM/SW Contact  Jannice Mends, LCSW Phone Number: 03/03/2024, 9:04 AM  Clinical Narrative:    Awaiting response fro [facility] on if they can accommodate TTS Dialysis days.   Expected Discharge Plan: Skilled Nursing Facility Barriers to Discharge: Continued Medical Work up, English as a second language teacher (OP HD chair)  Expected Discharge Plan and Services     Post Acute Care Choice: Skilled Nursing Facility Living arrangements for the past 2 months: Single Family Home                                       Social Determinants of Health (SDOH) Interventions SDOH Screenings   Food Insecurity: No Food Insecurity (02/09/2024)  Housing: Low Risk  (02/09/2024)  Transportation Needs: No Transportation Needs (02/09/2024)  Utilities: Not At Risk (02/09/2024)  Depression (PHQ2-9): Low Risk  (02/08/2020)  Social Connections: Socially Isolated (02/11/2024)  Tobacco Use: Low Risk  (02/07/2024)    Readmission Risk Interventions    01/15/2022    2:56 PM 01/02/2022    2:03 PM 12/31/2021   10:32 AM  Readmission Risk Prevention Plan  Transportation Screening Complete Complete Complete  Medication Review Oceanographer) Complete Complete Complete  PCP or Specialist appointment within 3-5 days of discharge Complete Complete Complete  HRI or Home Care Consult Complete Complete Complete  SW Recovery Care/Counseling Consult Complete Complete Complete  Palliative Care Screening Not Applicable Not Applicable Not Applicable  Skilled Nursing Facility Not Applicable Not Applicable Not Applicable

## 2024-03-03 NOTE — Plan of Care (Signed)
  Problem: Coping: Goal: Ability to adjust to condition or change in health will improve Outcome: Progressing   Problem: Nutritional: Goal: Maintenance of adequate nutrition will improve Outcome: Progressing   Problem: Skin Integrity: Goal: Risk for impaired skin integrity will decrease Outcome: Progressing   Problem: Education: Goal: Knowledge of General Education information will improve Description: Including pain rating scale, medication(s)/side effects and non-pharmacologic comfort measures Outcome: Progressing   Problem: Activity: Goal: Risk for activity intolerance will decrease Outcome: Progressing   Problem: Nutrition: Goal: Adequate nutrition will be maintained Outcome: Progressing   Problem: Coping: Goal: Level of anxiety will decrease Outcome: Progressing   Problem: Pain Managment: Goal: General experience of comfort will improve and/or be controlled Outcome: Progressing   Problem: Safety: Goal: Ability to remain free from injury will improve Outcome: Progressing   Problem: Skin Integrity: Goal: Risk for impaired skin integrity will decrease Outcome: Progressing

## 2024-03-03 NOTE — Progress Notes (Signed)
 Received patient in bed to unit. Bay 5 Alert and oriented. x4 Informed consent signed and in chart. Yes  TX duration:3.5  Patient tolerated well.  Transported back to the room via chair Alert, without acute distress.  Hand-off given to patient's nurse. Jullie Oiler RN  Access used: R chest CVC Access issues: None  Total UF removed: 900 Medication(s) given: Dilaudid   0.5 mg Post HD weight: unable to obtain  ( chair) Post HD VS: 119/86, 97 MAP, 17 Resp, 100% on RA, 92 Pulse, 84.0 L BVP   Thomas [name] [facility]

## 2024-03-03 NOTE — Plan of Care (Signed)
  Problem: Education: Goal: Ability to describe self-care measures that may prevent or decrease complications (Diabetes Survival Skills Education) will improve Outcome: Progressing Goal: Individualized Educational Video(s) Outcome: Progressing   Problem: Coping: Goal: Ability to adjust to condition or change in health will improve Outcome: Progressing   Problem: Fluid Volume: Goal: Ability to maintain a balanced intake and output will improve Outcome: Progressing   Problem: Health Behavior/Discharge Planning: Goal: Ability to identify and utilize available resources and services will improve Outcome: Progressing Goal: Ability to manage health-related needs will improve Outcome: Progressing   Problem: Metabolic: Goal: Ability to maintain appropriate glucose levels will improve Outcome: Progressing   Problem: Nutritional: Goal: Maintenance of adequate nutrition will improve Outcome: Progressing Goal: Progress toward achieving an optimal weight will improve Outcome: Progressing   Problem: Skin Integrity: Goal: Risk for impaired skin integrity will decrease Outcome: Progressing   Problem: Tissue Perfusion: Goal: Adequacy of tissue perfusion will improve Outcome: Progressing   Problem: Education: Goal: Knowledge of General Education information will improve Description: Including pain rating scale, medication(s)/side effects and non-pharmacologic comfort measures Outcome: Progressing   Problem: Health Behavior/Discharge Planning: Goal: Ability to manage health-related needs will improve Outcome: Progressing   Problem: Clinical Measurements: Goal: Ability to maintain clinical measurements within normal limits will improve Outcome: Progressing Goal: Will remain free from infection Outcome: Progressing Goal: Diagnostic test results will improve Outcome: Progressing Goal: Respiratory complications will improve Outcome: Progressing Goal: Cardiovascular complication will  be avoided Outcome: Progressing   Problem: Activity: Goal: Risk for activity intolerance will decrease Outcome: Progressing   Problem: Nutrition: Goal: Adequate nutrition will be maintained Outcome: Progressing   Problem: Coping: Goal: Level of anxiety will decrease Outcome: Progressing   Problem: Elimination: Goal: Will not experience complications related to bowel motility Outcome: Progressing Goal: Will not experience complications related to urinary retention Outcome: Progressing   Problem: Pain Managment: Goal: General experience of comfort will improve and/or be controlled Outcome: Progressing   Problem: Safety: Goal: Ability to remain free from injury will improve Outcome: Progressing   Problem: Skin Integrity: Goal: Risk for impaired skin integrity will decrease Outcome: Progressing   Problem: Education: Goal: Knowledge of the prescribed therapeutic regimen will improve Outcome: Progressing Goal: Ability to verbalize activity precautions or restrictions will improve Outcome: Progressing Goal: Understanding of discharge needs will improve Outcome: Progressing   Problem: Activity: Goal: Ability to perform//tolerate increased activity and mobilize with assistive devices will improve Outcome: Progressing   Problem: Clinical Measurements: Goal: Postoperative complications will be avoided or minimized Outcome: Progressing   Problem: Self-Care: Goal: Ability to meet self-care needs will improve Outcome: Progressing   Problem: Self-Concept: Goal: Ability to maintain and perform role responsibilities to the fullest extent possible will improve Outcome: Progressing   Problem: Pain Management: Goal: Pain level will decrease with appropriate interventions Outcome: Progressing   Problem: Skin Integrity: Goal: Demonstration of wound healing without infection will improve Outcome: Progressing

## 2024-03-03 NOTE — Progress Notes (Signed)
 PROGRESS NOTE        PATIENT DETAILS Name: Thomas [name] Age: 73 y.o. Sex: male Date of Birth: 03-04-1951 Admit Date: 02/07/2024 Admitting Physician Roxana Copier, DO ION:[email or web address], [name], MD  Brief Summary:  Patient is a 73 y.o.  male who with history of ESRD on HD TTS, history of posterior fusion at T10-12 due to osteomyelitis/discitis/epidural abscess in 2021, history of right BKA who recently completed 6 weeks of daptomycin /cefepime  on 3/28 for left foot infection with osteomyelitis-presented with low back pain-upon further evaluation with CT imaging-he was found to have acute osteomyelitis involving his thoracic spine and admitted to the hospitalist service.  Significant events: 4/20>> admit to TRH-back pain-concern for thoracic osteomyelitis.  Significant studies: 4/21>> x-ray right elbow: No fracture. 4/21>> x-ray left wrist: No fracture 4/21>> x-ray left shoulder, no fracture. 4/21>> CT head: No acute intracranial abnormality 4/21>> CT C-spine: No fracture. 4/21>> CT abdomen/pelvis: No acute injury to chest/abdomen/pelvis 4/21>> CT T-spine: Prior posterior fusion from T10-T12-abnormal appearance with collapse of T10-lucency around T10 screws at the T9-10 disc space, T10-11 disc space concerning for discitis/osteomyelitis. 4/21>> CT L-spine: No acute bony abnormality, mild chronic compression fracture at L1. 4/22>> x-ray left foot: Chronic erosive changes about the forefoot similar to 11/21/2023-osteomyelitis/septic arthritis of the 1st/2nd MTP joints difficult to exclude. 4/25>> left ABI: Within normal limit.  Significant microbiology data: 4/22>> blood culture: No growth  Procedures: 4/30>> left transmetatarsal amputation by Dr. Julio Ohm.  Consults: Neurosurgery Infectious disease Ortho  Subjective:    Morning in bed, eating breakfast, watching TV, no apparent distress, asking for more IV pain medication during his dialysis session while  sitting up, I have asked him to wait to see how is he doing first before asking for extra IV pain medicine.     Objective: Vitals: Blood pressure 120/68, pulse 93, temperature 98 F (36.7 C), resp. rate 19, height 6\' 5"  (1.956 m), weight 85.4 kg, SpO2 94%.   Exam:  Awake Alert, Oriented X 3, No new F.N deficits, Normal affect Symmetrical Chest wall movement, Good air movement bilaterally, CTAB RRR,No Gallops,Rubs or new Murmurs, No Parasternal Heave +ve B.Sounds, Abd Soft, No tenderness, No rebound - guarding or rigidity. Extremities: Left TMA dressing in place, s/p chronic right AKA.   Assessment/Plan:  Acute osteomyelitis with discitis involving T9 to T11 vertebra Per neurosurgery not an operative candidate Per IR-no window to aspirate Plan is for 6 weeks of vancomycin /cefepime  with hemodialysis-EOT 03/23/24 Blood cultures negative so far Back pain better on long and short acting Oxy codon, stop IV Dilaudid , he is in no distress and always found sleeping comfortably Continue to wear TLSO brace when out of the bed. Note patient is on high doses of narcotics chronically, exhibits some narcotic seeking behavior, counseled to avoid excessive use to avoid accidental overdose, as needed Narcan  on board.  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 Chronic left second toe ulceration He just completed 6 weeks of daptomycin /cefepime  on 3/28-has refused amputation in the past.  Concern is that he may have seeded his spine through foot infection.  Subsequently evaluated by Dr. Julio Ohm on 4/28-underwent TMA on 4/30.  Toe-touch weightbearing on that leg.  Transient hypotension on 4/30 Now stable on low-dose Norvasc , clonidine /losartan /metoprolol  remains on hold-continue to monitor and add as appropriate.  Constipation Chronic issue-secondary to narcotics Finally had a BM after several  days-remains on MiraLAX /senna-Movantik  added on 5/2. Dulcolax suppository as needed.  Mechanical  fall Numerous imaging studies negative for fracture PT/OT eval  ESRD on HD TTS Nephrology following and directing HD care  Hyperkalemia on 02/20/2024 He underwent HD 02/20/2024 afternoon, 2 doses of Lokelma  on 02/22/2024, defer to nephrology  Prolonged QTc Twelve-lead EKG on 4/30 with normal QTc Telemetry monitoring  Normocytic anemia Secondary to ESRD Aranesp /iron  deferred to nephrology service.  Chronic HFpEF Euvolemic on exam Fluid removal with HD  HTN See above regarding hypotension BP relatively stable-see above discussion.  HLD Statin  Hypothyroidism Synthroid   BPH Flomax /bethanechol   History of right BKA  Debility/deconditioning For SNF placement, will need clear process as will need to be placed in [place], renal to attempt HD setting up tomorrow.  BMI: Estimated body mass index is 22.33 kg/m as calculated from the following:   Height as of this encounter: 6\' 5"  (1.956 m).   Weight as of this encounter: 85.4 kg.   Code status:   Code Status: Full Code   DVT Prophylaxis: SCD's Start: 02/17/24 1538 heparin  injection 5,000 Units Start: 02/10/24 1400 SCDs Start: 02/08/24 2206   Family Communication: Discussed with the patient, none at bedside today   Disposition Plan: Status is: Inpatient Remains inpatient appropriate because: Severity of illness   Planned Discharge Destination: SNF   Diet: Diet Order             Diet renal with fluid restriction Fluid restriction: 1200 mL Fluid; Room service appropriate? Yes; Fluid consistency: Thin  Diet effective now                    MEDICATIONS: Scheduled Meds:  vitamin C   1,000 mg Oral Daily   atorvastatin   80 mg Oral Daily   bethanechol   10 mg Oral BID   Chlorhexidine  Gluconate Cloth  6 each Topical Q0600   darbepoetin (ARANESP ) injection - DIALYSIS  100 mcg Subcutaneous Q Sat-1800   heparin  injection (subcutaneous)  5,000 Units Subcutaneous Q8H   lactulose   30 g Oral Daily   leptospermum  manuka honey  1 Application Topical Daily   levothyroxine   50 mcg Oral QAC breakfast   midodrine   10 mg Oral BID WC   naloxegol  oxalate  12.5 mg Oral Daily   nutrition supplement (JUVEN)  1 packet Oral BID BM   oxyCODONE   30 mg Oral Q8H   polyethylene glycol  17 g Oral BID   senna-docusate  4 tablet Oral BID   sevelamer  carbonate  1,600 mg Oral TID WC   tamsulosin   0.4 mg Oral Daily   Continuous Infusions:  sodium chloride  10 mL/hr at 02/29/24 1700   albumin  human     anticoagulant sodium citrate      ceFEPime  (MAXIPIME ) IV 2 g (03/01/24 2029)   promethazine  (PHENERGAN ) injection (IM or IVPB) Stopped (03/02/24 2156)   vancomycin  750 mg (03/01/24 2112)   PRN Meds:.acetaminophen  **OR** acetaminophen , alteplase , anticoagulant sodium citrate , bisacodyl , heparin , HYDROmorphone  (DILAUDID ) injection, lidocaine  (PF), lidocaine -prilocaine , melatonin, naLOXone  (NARCAN )  injection, ondansetron  (ZOFRAN ) IV, oxyCODONE , pentafluoroprop-tetrafluoroeth, promethazine  (PHENERGAN ) injection (IM or IVPB), tiZANidine    I have personally reviewed following labs and imaging studies  LABORATORY DATA: CBC: Recent Labs  Lab 02/26/24 0625 02/27/24 0642 03/01/24 0748  WBC 5.0 5.7 4.8  NEUTROABS  --  3.3  --   HGB 8.5* 9.7* 14.8  HCT 26.5* 30.6* 46.6  MCV 104.7* 107.4* 107.9*  PLT 149* 160 121*    Basic Metabolic Panel: Recent Labs  Lab 02/26/24 0625 02/27/24 0642 03/01/24 0748  NA 133* 140 141  K 5.0 4.9 4.3  CL 94* 99 99  CO2 25 22 22   GLUCOSE 86 88 98  BUN 43* 61* 70*  CREATININE 6.60* 8.73* 10.53*  CALCIUM  8.9 9.4 9.3  PHOS 7.1* 7.9* 8.6*    GFR:  Estimated Creatinine Clearance: 7.9 mL/min (A) (by C-G formula based on SCr of 10.53 mg/dL (H)).  Liver Function Tests: Recent Labs  Lab 02/26/24 0625 02/27/24 0642 03/01/24 0748  ALBUMIN  3.0* 3.3* 3.5    Urine analysis:     Component Value Date/Time   COLORURINE YELLOW 01/14/2022 0248   APPEARANCEUR HAZY (A) 01/14/2022 0248    LABSPEC 1.009 01/14/2022 0248   PHURINE 7.0 01/14/2022 0248   GLUCOSEU NEGATIVE 01/14/2022 0248   HGBUR MODERATE (A) 01/14/2022 0248   BILIRUBINUR NEGATIVE 01/14/2022 0248   KETONESUR NEGATIVE 01/14/2022 0248   PROTEINUR 100 (A) 01/14/2022 0248   NITRITE NEGATIVE 01/14/2022 0248   LEUKOCYTESUR LARGE (A) 01/14/2022 0248    Sepsis Labs:  Lactic Acid, Venous    Component Value Date/Time   LATICACIDVEN 1.0 12/31/2021 0048    LOS: 24 days   Signature  -    Seena Dadds M.D on 03/03/2024 at 3:01 PM   -  To page go to [email or web address]

## 2024-03-03 NOTE — Progress Notes (Signed)
 TRH night cross cover note:   I was notified by the patient's RN of the patient's request for medication for his excessive drooling.  Per patient's RN, this is a patient with severe kyphosis who is consistently leaning forward as a consequence of this, promoting recurrent, incessant drooling.  I subsequently placed a one-time order for IV Robinul.    Camelia Cavalier, DO Hospitalist

## 2024-03-04 DIAGNOSIS — M4624 Osteomyelitis of vertebra, thoracic region: Secondary | ICD-10-CM | POA: Diagnosis not present

## 2024-03-04 LAB — GLUCOSE, CAPILLARY
Glucose-Capillary: 98 mg/dL (ref 70–99)
Glucose-Capillary: 121 mg/dL — ABNORMAL HIGH (ref 70–99)
Glucose-Capillary: 94 mg/dL (ref 70–99)
Glucose-Capillary: 106 mg/dL — ABNORMAL HIGH (ref 70–99)

## 2024-03-04 MED ORDER — CHLORHEXIDINE GLUCONATE CLOTH 2 % EX PADS
6.0000 | MEDICATED_PAD | Freq: Every day | CUTANEOUS | Status: DC
Start: 2024-03-05 — End: 2024-03-11
  Administered 2024-03-05 – 2024-03-11 (×7): 6 via TOPICAL

## 2024-03-04 NOTE — Progress Notes (Signed)
 SNF unable to accommodate a TTS 1st shift appt at [facility]. Navigator checking with [facility] admissions and clinic staff regarding possibility of TTS 2nd shift. Awaiting response from admissions and/or clinic. Will assist as needed.   Lauraine Polite Renal Navigator [phone]

## 2024-03-04 NOTE — Progress Notes (Signed)
 PROGRESS NOTE        PATIENT DETAILS Name: Thomas [name] Age: 73 y.o. Sex: male Date of Birth: 1951/06/15 Admit Date: 02/07/2024 Admitting Physician Roxana Copier, DO WUJ:[id number], [name], MD  Brief Summary:  Patient is a 73 y.o.  male who with history of ESRD on HD TTS, history of posterior fusion at T10-12 due to osteomyelitis/discitis/epidural abscess in 2021, history of right BKA who recently completed 6 weeks of daptomycin /cefepime  on 3/28 for left foot infection with osteomyelitis-presented with low back pain-upon further evaluation with CT imaging-he was found to have acute osteomyelitis involving his thoracic spine and admitted to the hospitalist service.  Significant events: 4/20>> admit to TRH-back pain-concern for thoracic osteomyelitis.  Significant studies: 4/21>> x-ray right elbow: No fracture. 4/21>> x-ray left wrist: No fracture 4/21>> x-ray left shoulder, no fracture. 4/21>> CT head: No acute intracranial abnormality 4/21>> CT C-spine: No fracture. 4/21>> CT abdomen/pelvis: No acute injury to chest/abdomen/pelvis 4/21>> CT T-spine: Prior posterior fusion from T10-T12-abnormal appearance with collapse of T10-lucency around T10 screws at the T9-10 disc space, T10-11 disc space concerning for discitis/osteomyelitis. 4/21>> CT L-spine: No acute bony abnormality, mild chronic compression fracture at L1. 4/22>> x-ray left foot: Chronic erosive changes about the forefoot similar to 11/21/2023-osteomyelitis/septic arthritis of the 1st/2nd MTP joints difficult to exclude. 4/25>> left ABI: Within normal limit.  Significant microbiology data: 4/22>> blood culture: No growth  Procedures: 4/30>> left transmetatarsal amputation by Dr. Julio Ohm.  Consults: Neurosurgery Infectious disease Ortho  Subjective:    No significant events overnight, he denies any complaints today, he tolerated his HD setting up in a chair yesterday.   .   Objective: Vitals: Blood pressure 113/81, pulse 69, temperature 97.9 F (36.6 C), temperature source Oral, resp. rate 18, height 6\' 5"  (1.956 m), weight 88.4 kg, SpO2 98%.   Exam:  Awake Alert, Oriented X 3, No new F.N deficits, Normal affect Extremities: Left TMA dressing in place, s/p chronic right AKA.   Assessment/Plan:  Acute osteomyelitis with discitis involving T9 to T11 vertebra Per neurosurgery not an operative candidate Per IR-no window to aspirate Plan is for 6 weeks of vancomycin /cefepime  with hemodialysis-EOT 03/23/24 Blood cultures negative so far Back pain better on long and short acting Oxy codon, stop IV Dilaudid , he is in no distress and always found sleeping comfortably Continue to wear TLSO brace when out of the bed. Note patient is on high doses of narcotics chronically, exhibits some narcotic seeking behavior, counseled to avoid excessive use to avoid accidental overdose, as needed Narcan  on board.  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 Chronic left second toe ulceration He just completed 6 weeks of daptomycin /cefepime  on 3/28-has refused amputation in the past.  Concern is that he may have seeded his spine through foot infection.  Subsequently evaluated by Dr. Julio Ohm on 4/28-underwent TMA on 4/30.  Toe-touch weightbearing on that leg.  Transient hypotension on 4/30 Now stable on low-dose Norvasc , clonidine /losartan /metoprolol  remains on hold-continue to monitor and add as appropriate.  Constipation Chronic issue-secondary to narcotics Finally had a BM after several days-remains on MiraLAX /senna-Movantik  added on 5/2. Dulcolax suppository as needed.  Mechanical fall Numerous imaging studies negative for fracture PT/OT eval  ESRD on HD TTS Nephrology following and directing HD care  Hyperkalemia on 02/20/2024 He underwent HD 02/20/2024 afternoon, 2 doses of Lokelma  on 02/22/2024, defer to nephrology  Prolonged QTc Twelve-lead EKG on  4/30 with normal QTc Telemetry monitoring  Normocytic anemia Secondary to ESRD Aranesp /iron  deferred to nephrology service.  Chronic HFpEF Euvolemic on exam Fluid removal with HD  HTN See above regarding hypotension BP relatively stable-see above discussion.  HLD Statin  Hypothyroidism Synthroid   BPH Flomax /bethanechol   History of right BKA  Debility/deconditioning For SNF placement, will need clear process as will need to be placed in [place], currently clipped in [place], tolerating dialysis sitting up,   BMI: Estimated body mass index is 23.11 kg/m as calculated from the following:   Height as of this encounter: 6\' 5"  (1.956 m).   Weight as of this encounter: 88.4 kg.   Code status:   Code Status: Full Code   DVT Prophylaxis: SCD's Start: 02/17/24 1538 heparin  injection 5,000 Units Start: 02/10/24 1400 SCDs Start: 02/08/24 2206   Family Communication: Discussed with the patient, none at bedside today   Disposition Plan: Status is: Inpatient He is medically stable for discharge when SNF bed available and [facility] has been obtained   Planned Discharge Destination: SNF   Diet: Diet Order             Diet renal with fluid restriction Fluid restriction: 1200 mL Fluid; Room service appropriate? Yes; Fluid consistency: Thin  Diet effective now                    MEDICATIONS: Scheduled Meds:  vitamin C   1,000 mg Oral Daily   atorvastatin   80 mg Oral Daily   bethanechol   10 mg Oral BID   Chlorhexidine  Gluconate Cloth  6 each Topical Q0600   darbepoetin (ARANESP ) injection - DIALYSIS  100 mcg Subcutaneous Q Sat-1800   heparin  injection (subcutaneous)  5,000 Units Subcutaneous Q8H   lactulose   30 g Oral Daily   leptospermum manuka honey  1 Application Topical Daily   levothyroxine   50 mcg Oral QAC breakfast   midodrine   10 mg Oral BID WC   naloxegol  oxalate  12.5 mg Oral Daily   nutrition supplement (JUVEN)  1 packet Oral BID BM   oxyCODONE    30 mg Oral Q8H   polyethylene glycol  17 g Oral BID   senna-docusate  4 tablet Oral BID   sevelamer  carbonate  1,600 mg Oral TID WC   tamsulosin   0.4 mg Oral Daily   Continuous Infusions:  sodium chloride  10 mL/hr at 02/29/24 1700   albumin  human     ceFEPime  (MAXIPIME ) IV 2 g (03/03/24 1800)   promethazine  (PHENERGAN ) injection (IM or IVPB) 12.5 mg (03/04/24 1113)   vancomycin  750 mg (03/03/24 1843)   PRN Meds:.acetaminophen  **OR** acetaminophen , bisacodyl , melatonin, naLOXone  (NARCAN )  injection, ondansetron  (ZOFRAN ) IV, oxyCODONE , promethazine  (PHENERGAN ) injection (IM or IVPB), tiZANidine    I have personally reviewed following labs and imaging studies  LABORATORY DATA: CBC: Recent Labs  Lab 02/27/24 0642 03/01/24 0748  WBC 5.7 4.8  NEUTROABS 3.3  --   HGB 9.7* 14.8  HCT 30.6* 46.6  MCV 107.4* 107.9*  PLT 160 121*    Basic Metabolic Panel: Recent Labs  Lab 02/27/24 0642 03/01/24 0748  NA 140 141  K 4.9 4.3  CL 99 99  CO2 22 22  GLUCOSE 88 98  BUN 61* 70*  CREATININE 8.73* 10.53*  CALCIUM  9.4 9.3  PHOS 7.9* 8.6*    GFR:  Estimated Creatinine Clearance: 8.2 mL/min (A) (by C-G formula based on SCr of 10.53 mg/dL (H)).  Liver Function Tests: Recent Labs  Lab 02/27/24 0642 03/01/24 0748  ALBUMIN  3.3* 3.5    Urine analysis:     Component Value Date/Time   COLORURINE YELLOW 01/14/2022 0248   APPEARANCEUR HAZY (A) 01/14/2022 0248   LABSPEC 1.009 01/14/2022 0248   PHURINE 7.0 01/14/2022 0248   GLUCOSEU NEGATIVE 01/14/2022 0248   HGBUR MODERATE (A) 01/14/2022 0248   BILIRUBINUR NEGATIVE 01/14/2022 0248   KETONESUR NEGATIVE 01/14/2022 0248   PROTEINUR 100 (A) 01/14/2022 0248   NITRITE NEGATIVE 01/14/2022 0248   LEUKOCYTESUR LARGE (A) 01/14/2022 0248    Sepsis Labs:  Lactic Acid, Venous    Component Value Date/Time   LATICACIDVEN 1.0 12/31/2021 0048    LOS: 25 days   Signature  -    Seena Dadds M.D on 03/04/2024 at 1:20 PM   -  To page  go to [email or web address]

## 2024-03-04 NOTE — TOC Progression Note (Signed)
 Transition of Care [facility]) - Progression Note    Patient Details  Name: Thomas [name] MRN: [id number] Date of Birth: 1950-11-05  [facility]) CM/SW Contact  Jannice Mends, LCSW Phone Number: 03/04/2024, 11:56 AM  Clinical Narrative:    [facility] unable to accommodate 1st shift TTS time. Renal Navigator checking to see time of second shift TTS to see if that is acceptable for [facility] transport. [facility] came to visit patient today.   Expected Discharge Plan: Skilled Nursing Facility Barriers to Discharge: Continued Medical Work up, English as a second language teacher (OP HD chair)  Expected Discharge Plan and Services     Post Acute Care Choice: Skilled Nursing Facility Living arrangements for the past 2 months: Single Family Home                                       Social Determinants of Health (SDOH) Interventions SDOH Screenings   Food Insecurity: No Food Insecurity (02/09/2024)  Housing: Low Risk  (02/09/2024)  Transportation Needs: No Transportation Needs (02/09/2024)  Utilities: Not At Risk (02/09/2024)  Depression (PHQ2-9): Low Risk  (02/08/2020)  Social Connections: Socially Isolated (02/11/2024)  Tobacco Use: Low Risk  (02/07/2024)    Readmission Risk Interventions    01/15/2022    2:56 PM 01/02/2022    2:03 PM 12/31/2021   10:32 AM  Readmission Risk Prevention Plan  Transportation Screening Complete Complete Complete  Medication Review Oceanographer) Complete Complete Complete  PCP or Specialist appointment within 3-5 days of discharge Complete Complete Complete  HRI or Home Care Consult Complete Complete Complete  SW Recovery Care/Counseling Consult Complete Complete Complete  Palliative Care Screening Not Applicable Not Applicable Not Applicable  Skilled Nursing Facility Not Applicable Not Applicable Not Applicable

## 2024-03-04 NOTE — Progress Notes (Signed)
 [facility] Progress Note   Subjective: Patient seen in room today. He was sleeping when I arrived. He had HD yesterday in his chair. He was concerned that he would be unable to sit for full HD treatment due to his chronic sacral pain but he did. We did explain that he would not be able to receive OP HD and receive IV pain medication.   Objective Vitals:   03/03/24 2321 03/04/24 0334 03/04/24 0409 03/04/24 0800  BP: 90/63 112/75  113/81  Pulse: 71 69  69  Resp: 20 18  18   Temp: 98 F (36.7 C) 97.8 F (36.6 C)  97.9 F (36.6 C)  TempSrc: Oral Oral  Oral  SpO2: 98% 98%    Weight:   88.4 kg   Height:       Physical Exam General: Alert, chronically- ill appearing, nad  Heart: RRR, no MGR Lungs: Clear, normal wob, room air Abdomen: Soft, non-tender Extremities: R BKA; + L TMA wrapped Dialysis Access: [facility]   Filed Weights   03/01/24 0729 03/03/24 0500 03/04/24 0409  Weight: 85 kg 85.4 kg 88.4 kg    Intake/Output Summary (Last 24 hours) at 03/04/2024 1441 Last data filed at 03/03/2024 1450 Gross per 24 hour  Intake --  Output 900 ml  Net -900 ml    Additional Objective Labs: Basic Metabolic Panel: Recent Labs  Lab 02/27/24 0642 03/01/24 0748  NA 140 141  K 4.9 4.3  CL 99 99  CO2 22 22  GLUCOSE 88 98  BUN 61* 70*  CREATININE 8.73* 10.53*  CALCIUM  9.4 9.3  PHOS 7.9* 8.6*   Liver Function Tests: Recent Labs  Lab 02/27/24 0642 03/01/24 0748  ALBUMIN  3.3* 3.5   No results for input(s): "LIPASE", "AMYLASE" in the last 168 hours. CBC: Recent Labs  Lab 02/27/24 0642 03/01/24 0748  WBC 5.7 4.8  NEUTROABS 3.3  --   HGB 9.7* 14.8  HCT 30.6* 46.6  MCV 107.4* 107.9*  PLT 160 121*   Blood Culture    Component Value Date/Time   SDES BLOOD BLOOD RIGHT HAND AEROBIC BOTTLE ONLY 02/09/2024 0414   SPECREQUEST  02/09/2024 0414    BOTTLES DRAWN AEROBIC ONLY Blood Culture adequate volume   CULT  02/09/2024 0414    NO GROWTH 5 DAYS Performed at  [facility], [address] [place], [place] [address]    REPTSTATUS 02/14/2024 FINAL 02/09/2024 0414    Cardiac Enzymes: No results for input(s): "CKTOTAL", "CKMB", "CKMBINDEX", "TROPONINI" in the last 168 hours. CBG: Recent Labs  Lab 03/03/24 0734 03/03/24 1613 03/03/24 2109 03/04/24 0805 03/04/24 1150  GLUCAP 96 112* 113* 98 121*   Iron  Studies: No results for input(s): "IRON ", "TIBC", "TRANSFERRIN", "FERRITIN" in the last 72 hours. Lab Results  Component Value Date   INR 1.1 02/09/2024   INR 1.1 02/08/2024   INR 1.3 (H) 01/06/2024   Studies/Results: No results found.  Medications:  sodium chloride  10 mL/hr at 02/29/24 1700   albumin  human     ceFEPime  (MAXIPIME ) IV 2 g (03/03/24 1800)   promethazine  (PHENERGAN ) injection (IM or IVPB) 12.5 mg (03/04/24 1113)   vancomycin  750 mg (03/03/24 1843)    vitamin C   1,000 mg Oral Daily   atorvastatin   80 mg Oral Daily   bethanechol   10 mg Oral BID   Chlorhexidine  Gluconate Cloth  6 each Topical Q0600   darbepoetin (ARANESP ) injection - DIALYSIS  100 mcg Subcutaneous Q Sat-1800   heparin  injection (subcutaneous)  5,000 Units Subcutaneous Q8H   lactulose   30 g Oral Daily   leptospermum manuka honey  1 Application Topical Daily   levothyroxine   50 mcg Oral QAC breakfast   midodrine   10 mg Oral BID WC   naloxegol  oxalate  12.5 mg Oral Daily   nutrition supplement (JUVEN)  1 packet Oral BID BM   oxyCODONE   30 mg Oral Q8H   polyethylene glycol  17 g Oral BID   senna-docusate  4 tablet Oral BID   sevelamer  carbonate  1,600 mg Oral TID WC   tamsulosin   0.4 mg Oral Daily    Dialysis Orders: TTS [facility] 4h  B400  92kg  TDC  Heparin  none - Mircera 225mcg IV q 2 weeks (last 4/9) - Calcitriol  0.35mcg PO q HD  Assessment/Plan: 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 CT imaging showed T9-10 discitis. He refused MRI, area was too small for aspiration per IR. H/o of foot infection as below. Concerned that  this infection may have seeded the spine. Blood Cxs were negative. Not an operative candidate per neurosurgery.  Per ID continue IV Vanc 750mg  and IV Cefepime  2gm with HD x 6 weeks thru 03/23/24 for the discitis.  Left foot osteomyelitis - completed an 8 week course of Dapto/Cefepime  on 3/28 for bacteremia presumed due to foot infection. S/p left TMA on 4/30. Antibiotics as above.  Hyperkalemia -renal diet. Improved. K 4.3 ESRD:  Usual TTS schedule. Next HD 03/05/24 HTN/volume: BPs okay, at EDW.  Has some residual UOP, euvolemic. Cramps with minimal UF -keep even  Anemia: HGB 14.1. Follow labs. Hold weekly ESA. No venofer in setting of infection. Metabolic bone disease: Ca ok, Phos high - continue home binders. 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 Dispo - For SNF placement.  Chronic pain: he has PRN IV pain medication for this treatment only. He will have to be able to tolerate only oral pain medication for his future treatments.   Thomas Lorenzo, NP [facility] 03/04/2024,2:41 PM

## 2024-03-04 NOTE — Plan of Care (Signed)
  Problem: Education: Goal: Ability to describe self-care measures that may prevent or decrease complications (Diabetes Survival Skills Education) will improve Outcome: Progressing Goal: Individualized Educational Video(s) Outcome: Progressing   Problem: Coping: Goal: Ability to adjust to condition or change in health will improve Outcome: Progressing   Problem: Fluid Volume: Goal: Ability to maintain a balanced intake and output will improve Outcome: Progressing   Problem: Metabolic: Goal: Ability to maintain appropriate glucose levels will improve Outcome: Progressing   Problem: Nutritional: Goal: Maintenance of adequate nutrition will improve Outcome: Progressing Goal: Progress toward achieving an optimal weight will improve Outcome: Progressing   Problem: Skin Integrity: Goal: Risk for impaired skin integrity will decrease Outcome: Progressing   Problem: Tissue Perfusion: Goal: Adequacy of tissue perfusion will improve Outcome: Progressing   Problem: Education: Goal: Knowledge of General Education information will improve Description: Including pain rating scale, medication(s)/side effects and non-pharmacologic comfort measures Outcome: Progressing   Problem: Health Behavior/Discharge Planning: Goal: Ability to manage health-related needs will improve Outcome: Progressing   Problem: Clinical Measurements: Goal: Ability to maintain clinical measurements within normal limits will improve Outcome: Progressing Goal: Will remain free from infection Outcome: Progressing Goal: Diagnostic test results will improve Outcome: Progressing Goal: Respiratory complications will improve Outcome: Progressing Goal: Cardiovascular complication will be avoided Outcome: Progressing

## 2024-03-04 NOTE — Progress Notes (Addendum)
 OT Cancellation Note  Patient Details Name: Thomas [name] MRN: [id number] DOB: [birth year]   Cancelled Treatment:    Reason Eval/Treat Not Completed: Fatigue/lethargy limiting ability to participate;Other (comment) 10:21 a.m.  Pt c/o nausea and being hungry. OT notified RN who reports pt had bee given Zofran  ~1 hour prior. OT will follow up next available time  [name] 03/04/2024, 11:19 AM

## 2024-03-04 NOTE — Progress Notes (Addendum)
 Physical Therapy Treatment Patient Details Name: Thomas [name] MRN: [id number] DOB: 05/06/1951 Today's Date: 03/04/2024   History of Present Illness The pt is a 73 yo male presenting 4/20 with back pain after a fall at home. 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 Discussion on benefits of mobility/risks of immobility, back precautions and LLE TWB precs (also updated board to include recs), and RN messaged to encourage weekend staff to assist him OOB to drop arm chair daily to build his tolerance for HD sessions in chair. Pt given HEP handout and able to demo IS use and visual/verbal demo for HEP with some teachback although pt becomes irritable and refusing to attempt seated HEP or activity. Patient will benefit from continued inpatient follow up therapy, <3 hours/day     If plan is discharge home, recommend the following: Two people to help with walking and/or transfers;Two people to help with bathing/dressing/bathroom;Assistance with cooking/housework;Assistance with feeding;Direct supervision/assist for medications management;Direct supervision/assist for financial management;Assist for transportation;Help with stairs or ramp for entrance   Can travel by private vehicle     No  Equipment Recommendations  Hospital bed (wheelchair (but pt states it will not fit in his home))    Recommendations for Other Services       Precautions / Restrictions Precautions Precautions: Back;Fall Precaution Booklet Issued: Yes (comment) Recall of  Precautions/Restrictions: Impaired Precaution/Restrictions Comments: pt initially unable to recall; handout provided to reinforce Required Braces or Orthoses: Spinal Brace;Other Brace Spinal Brace: Thoracolumbosacral orthotic;Applied in sitting position Other Brace: Post op shoe Restrictions Weight Bearing Restrictions Per Provider Order: Yes LLE Weight Bearing Per Provider Order: Touchdown weight bearing Other Position/Activity Restrictions: Post op shoe     Mobility  Bed Mobility Overal bed mobility: Needs Assistance             General bed mobility comments: pt refusing to attempt despite encouragement    Transfers                        Ambulation/Gait                   Stairs             Wheelchair Mobility     Tilt Bed    Modified Rankin (Stroke Patients Only)       Balance                                            Communication Communication Communication: No apparent difficulties  Cognition Arousal: Lethargic Behavior During Therapy: Flat affect   PT - Cognitive impairments: No family/caregiver present to determine baseline                       PT - Cognition Comments: Self limiting. Educated on risks of immobility. Pt beginning to drool and increasing lethargy toward end of session although he refused EOB/OOB. Pt becomes irritable with encouragement to  participate in more exercises or repositioning. Following commands: Intact      Cueing Cueing Techniques: Verbal cues, Gestural cues  Exercises Other Exercises Other Exercises: pt able to demo back shoulder shrugs/rolls x5 reps, use of IS with impulsive technique but able to achieve 2500 mL (encouraged slower inhale) pt refusing to attempt EOB exercises or SLR due to c/o pain Other Exercises: Visual/verbal demo for chair push-ups, LAQ, SLR, scapular retraction and discussion on benefits of mobility and risks of immobility. Also reviewed log roll  technique given back precs and how to perform HEP reclined in chair if attempting marches to avoid excessive trunk flexion. HEP: [email or web address]: [email or web address]   General Comments General comments (skin integrity, edema, etc.): No acute s/sx distress other than pt c/o pain and unwillingness to attempt tasks; pt drooling and appears lethargic toward end of session.      Pertinent Vitals/Pain Pain Assessment Pain Assessment: 0-10 Pain Score: 9  Pain Location: back Pain Descriptors / Indicators: Aching, Constant, Guarding Pain Intervention(s): Limited activity within patient's tolerance, Monitored during session, Premedicated before session, Other (comment) (pt refuses OOB mobility)    Home Living                          Prior Function            PT Goals (current goals can now be found in the care plan section) Acute Rehab PT Goals Patient Stated Goal: to reduce pain PT Goal Formulation: With patient Time For Goal Achievement: 03/03/24 (messaged LB to clarify as unclear if goals were updated on 5/12 or not) Progress towards PT goals: Not progressing toward goals - comment    Frequency    Min 2X/week      PT Plan      Co-evaluation              AM-PAC PT "6 Clicks" Mobility   Outcome Measure  Help needed turning from your back to your side while in a flat bed without using bedrails?: A Little Help needed moving from lying on your back to sitting on the side of a flat bed without using bedrails?: A Little Help needed moving to and from a bed to a chair (including a wheelchair)?: A Lot Help needed standing up from a chair using your arms (e.g., wheelchair or bedside chair)?: Total Help needed to walk in hospital room?: Total Help needed climbing 3-5 steps with a railing? : Total 6 Click Score: 11    End of Session Equipment Utilized During Treatment:  (pt refusing EOB so did not don brace today) Activity Tolerance: Patient  limited by lethargy;Patient limited by pain Patient left: in bed;with call bell/phone within reach;with bed alarm set Nurse Communication: Mobility status;Other (comment) (recommendations that he scoot OOB to chair daily on weekend) PT Visit Diagnosis: Unsteadiness on feet (R26.81);Other abnormalities of gait and mobility (R26.89);Repeated falls (R29.6);Muscle weakness (generalized) (M62.81);Pain;Difficulty in walking, not elsewhere classified (R26.2);History of falling (Z91.81) Pain - part of body:  (back)     Time: 5784-6962 PT Time Calculation (min) (ACUTE ONLY): 14 min  Charges:    $Therapeutic Exercise: 8-22 mins PT General Charges $$ ACUTE PT VISIT: 1 Visit                     Roper Tolson P., PTA Acute Rehabilitation Services Secure Chat Preferred 9a-5:30pm Office: [phone]    Mariel Shope [facility] 03/04/2024, 6:35 PM

## 2024-03-05 DIAGNOSIS — M4624 Osteomyelitis of vertebra, thoracic region: Secondary | ICD-10-CM | POA: Diagnosis not present

## 2024-03-05 LAB — RENAL FUNCTION PANEL
Albumin: 3.4 g/dL — ABNORMAL LOW (ref 3.5–5.0)
Anion gap: 20 — ABNORMAL HIGH (ref 5–15)
BUN: 54 mg/dL — ABNORMAL HIGH (ref 8–23)
CO2: 22 mmol/L (ref 22–32)
Calcium: 9.2 mg/dL (ref 8.9–10.3)
Chloride: 92 mmol/L — ABNORMAL LOW (ref 98–111)
Creatinine, Ser: 9.48 mg/dL — ABNORMAL HIGH (ref 0.61–1.24)
GFR, Estimated: 5 mL/min — ABNORMAL LOW (ref >60)
Glucose, Bld: 116 mg/dL — ABNORMAL HIGH (ref 70–99)
Phosphorus: 7.4 mg/dL — ABNORMAL HIGH (ref 2.5–4.6)
Potassium: 3.9 mmol/L (ref 3.5–5.1)
Sodium: 134 mmol/L — ABNORMAL LOW (ref 135–145)

## 2024-03-05 LAB — CBC
HCT: 31.1 % — ABNORMAL LOW (ref 39.0–52.0)
Hemoglobin: 10.1 g/dL — ABNORMAL LOW (ref 13.0–17.0)
MCH: 34.6 pg — ABNORMAL HIGH (ref 26.0–34.0)
MCHC: 32.5 g/dL (ref 30.0–36.0)
MCV: 106.5 fL — ABNORMAL HIGH (ref 80.0–100.0)
Platelets: 166 10*3/uL (ref 150–400)
RBC: 2.92 MIL/uL — ABNORMAL LOW (ref 4.22–5.81)
RDW: 14.8 % (ref 11.5–15.5)
WBC: 8.4 10*3/uL (ref 4.0–10.5)
nRBC: 0 % (ref 0.0–0.2)

## 2024-03-05 LAB — GLUCOSE, CAPILLARY
Glucose-Capillary: 161 mg/dL — ABNORMAL HIGH (ref 70–99)
Glucose-Capillary: 111 mg/dL — ABNORMAL HIGH (ref 70–99)
Glucose-Capillary: 145 mg/dL — ABNORMAL HIGH (ref 70–99)

## 2024-03-05 MED ORDER — VANCOMYCIN HCL 750 MG/150ML IV SOLN
INTRAVENOUS | Status: AC
  Filled 2024-03-05: qty 150

## 2024-03-05 MED ORDER — OXYCODONE HCL 5 MG PO TABS
ORAL_TABLET | ORAL | Status: AC
  Filled 2024-03-05: qty 2

## 2024-03-05 MED ORDER — HEPARIN SODIUM (PORCINE) 1000 UNIT/ML IJ SOLN
INTRAMUSCULAR | Status: AC
  Filled 2024-03-05: qty 3

## 2024-03-05 NOTE — Progress Notes (Signed)
 PROGRESS NOTE        PATIENT DETAILS Name: Thomas [name] Age: 73 y.o. Sex: male Date of Birth: [birth year] Admit Date: 02/07/2024 Admitting Physician Roxana Copier, DO ZOX:WRUEAV, [name], MD  Brief Summary:  Patient is a 73 y.o.  male who with history of ESRD on HD TTS, history of posterior fusion at T10-12 due to osteomyelitis/discitis/epidural abscess in 2021, history of right BKA who recently completed 6 weeks of daptomycin /cefepime  on 3/28 for left foot infection with osteomyelitis-presented with low back pain-upon further evaluation with CT imaging-he was found to have acute osteomyelitis involving his thoracic spine and admitted to the hospitalist service.  Significant events: 4/20>> admit to TRH-back pain-concern for thoracic osteomyelitis.  Significant studies: 4/21>> x-ray right elbow: No fracture. 4/21>> x-ray left wrist: No fracture 4/21>> x-ray left shoulder, no fracture. 4/21>> CT head: No acute intracranial abnormality 4/21>> CT C-spine: No fracture. 4/21>> CT abdomen/pelvis: No acute injury to chest/abdomen/pelvis 4/21>> CT T-spine: Prior posterior fusion from T10-T12-abnormal appearance with collapse of T10-lucency around T10 screws at the T9-10 disc space, T10-11 disc space concerning for discitis/osteomyelitis. 4/21>> CT L-spine: No acute bony abnormality, mild chronic compression fracture at L1. 4/22>> x-ray left foot: Chronic erosive changes about the forefoot similar to 11/21/2023-osteomyelitis/septic arthritis of the 1st/2nd MTP joints difficult to exclude. 4/25>> left ABI: Within normal limit.  Significant microbiology data: 4/22>> blood culture: No growth  Procedures: 4/30>> left transmetatarsal amputation by Dr. Julio Ohm.  Consults: Neurosurgery Infectious disease Ortho  Subjective:    No significant events overnight, he denies any complaints today, he tolerated his hemodialysis today.   Objective: Vitals: Blood pressure  122/84, pulse 75, temperature 98 F (36.7 C), temperature source Oral, resp. rate 16, height 6\' 5"  (1.956 m), weight 83.7 kg, SpO2 100%.   Exam:  Awake Alert, Oriented X 3, No new F.N deficits, Normal affect Extremities: Left TMA dressing in place, s/p chronic right AKA.   Assessment/Plan:  Acute osteomyelitis with discitis involving T9 to T11 vertebra Per neurosurgery not an operative candidate Per IR-no window to aspirate Plan is for 6 weeks of vancomycin /cefepime  with hemodialysis-EOT 03/23/24 Blood cultures negative so far Back pain better on long and short acting Oxy codon, stop IV Dilaudid , he is in no distress and always found sleeping comfortably Continue to wear TLSO brace when out of the bed. Note patient is on high doses of narcotics chronically, exhibits some narcotic seeking behavior, counseled to avoid excessive use to avoid accidental overdose, as needed Narcan  on board.  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 Chronic left second toe ulceration He just completed 6 weeks of daptomycin /cefepime  on 3/28-has refused amputation in the past.  Concern is that he may have seeded his spine through foot infection.  Subsequently evaluated by Dr. Julio Ohm on 4/28-underwent TMA on 4/30.  Toe-touch weightbearing on that leg.  Transient hypotension on 4/30 Now stable on low-dose Norvasc , clonidine /losartan /metoprolol  remains on hold-continue to monitor and add as appropriate.  Constipation Chronic issue-secondary to narcotics Finally had a BM after several days-remains on MiraLAX /senna-Movantik  added on 5/2. Dulcolax suppository as needed.  Mechanical fall Numerous imaging studies negative for fracture PT/OT eval  ESRD on HD TTS Nephrology following and directing HD care  Hyperkalemia on 02/20/2024 He underwent HD 02/20/2024 afternoon, 2 doses of Lokelma  on 02/22/2024, defer to nephrology  Prolonged QTc Twelve-lead EKG on 4/30  with normal QTc Telemetry  monitoring  Normocytic anemia Secondary to ESRD Aranesp /iron  deferred to nephrology service.  Chronic HFpEF Euvolemic on exam Fluid removal with HD  HTN See above regarding hypotension BP relatively stable-see above discussion.  HLD Statin  Hypothyroidism Synthroid   BPH Flomax /bethanechol   History of right BKA  Debility/deconditioning For SNF placement, will need clear process as will need to be placed in [place], currently clipped in [place], tolerating dialysis sitting up,   BMI: Estimated body mass index is 21.88 kg/m as calculated from the following:   Height as of this encounter: 6\' 5"  (1.956 m).   Weight as of this encounter: 83.7 kg.   Code status:   Code Status: Full Code   DVT Prophylaxis: SCD's Start: 02/17/24 1538 heparin  injection 5,000 Units Start: 02/10/24 1400 SCDs Start: 02/08/24 2206   Family Communication: Discussed with the patient, none at bedside today   Disposition Plan: Status is: Inpatient He is medically stable for discharge when SNF bed available and [place] has been obtained   Planned Discharge Destination: SNF   Diet: Diet Order             Diet renal with fluid restriction Fluid restriction: 1200 mL Fluid; Room service appropriate? Yes; Fluid consistency: Thin  Diet effective now                    MEDICATIONS: Scheduled Meds:  vitamin C   1,000 mg Oral Daily   atorvastatin   80 mg Oral Daily   bethanechol   10 mg Oral BID   Chlorhexidine  Gluconate Cloth  6 each Topical Q0600   darbepoetin (ARANESP ) injection - DIALYSIS  100 mcg Subcutaneous Q Sat-1800   heparin  injection (subcutaneous)  5,000 Units Subcutaneous Q8H   lactulose   30 g Oral Daily   leptospermum manuka honey  1 Application Topical Daily   levothyroxine   50 mcg Oral QAC breakfast   midodrine   10 mg Oral BID WC   naloxegol  oxalate  12.5 mg Oral Daily   nutrition supplement (JUVEN)  1 packet Oral BID BM   oxyCODONE   30 mg Oral Q8H   polyethylene  glycol  17 g Oral BID   senna-docusate  4 tablet Oral BID   sevelamer  carbonate  1,600 mg Oral TID WC   tamsulosin   0.4 mg Oral Daily   Continuous Infusions:  sodium chloride  10 mL/hr at 02/29/24 1700   albumin  human     ceFEPime  (MAXIPIME ) IV 2 g (03/03/24 1800)   promethazine  (PHENERGAN ) injection (IM or IVPB) 12.5 mg (03/04/24 1113)   vancomycin  750 mg (03/05/24 1129)   PRN Meds:.acetaminophen  **OR** acetaminophen , bisacodyl , melatonin, naLOXone  (NARCAN )  injection, ondansetron  (ZOFRAN ) IV, oxyCODONE , promethazine  (PHENERGAN ) injection (IM or IVPB), tiZANidine    I have personally reviewed following labs and imaging studies  LABORATORY DATA: CBC: Recent Labs  Lab 03/01/24 0748 03/05/24 0909  WBC 4.8 8.4  HGB 14.8 10.1*  HCT 46.6 31.1*  MCV 107.9* 106.5*  PLT 121* 166    Basic Metabolic Panel: Recent Labs  Lab 03/01/24 0748 03/05/24 0909  NA 141 134*  K 4.3 3.9  CL 99 92*  CO2 22 22  GLUCOSE 98 116*  BUN 70* 54*  CREATININE 10.53* 9.48*  CALCIUM  9.3 9.2  PHOS 8.6* 7.4*    GFR:  Estimated Creatinine Clearance: 8.6 mL/min (A) (by C-G formula based on SCr of 9.48 mg/dL (H)).  Liver Function Tests: Recent Labs  Lab 03/01/24 0748 03/05/24 0909  ALBUMIN  3.5 3.4*  Urine analysis:     Component Value Date/Time   COLORURINE YELLOW 01/14/2022 0248   APPEARANCEUR HAZY (A) 01/14/2022 0248   LABSPEC 1.009 01/14/2022 0248   PHURINE 7.0 01/14/2022 0248   GLUCOSEU NEGATIVE 01/14/2022 0248   HGBUR MODERATE (A) 01/14/2022 0248   BILIRUBINUR NEGATIVE 01/14/2022 0248   KETONESUR NEGATIVE 01/14/2022 0248   PROTEINUR 100 (A) 01/14/2022 0248   NITRITE NEGATIVE 01/14/2022 0248   LEUKOCYTESUR LARGE (A) 01/14/2022 0248    Sepsis Labs:  Lactic Acid, Venous    Component Value Date/Time   LATICACIDVEN 1.0 12/31/2021 0048    LOS: 26 days   Signature  -    Seena Dadds M.D on 03/05/2024 at 3:01 PM   -  To page go to [email or web address]

## 2024-03-05 NOTE — Progress Notes (Signed)
 [facility] Progress Note   Subjective:   Seen on dialysis, doing a word search, no concerns today. Denies SOB, CP, dizziness, nausea.   Objective Vitals:   03/05/24 0900 03/05/24 0909 03/05/24 0930 03/05/24 1000  BP: 96/84 118/77 117/73 121/66  Pulse: 82 84 81 76  Resp: 19 11 13 12   Temp: 98 F (36.7 C)     TempSrc:      SpO2: 97% 100% 97% 99%  Weight: 83.6 kg     Height:       Physical Exam General: Alert male in NAD Heart: RRR, no murmurs, rubs or gallops  Lungs:CTA bilaterally, respirations unlabored  Abdomen: Soft, non-distended Extremities: R BKA, L TMA, no edema appreciated Dialysis Access:  [facility]  Additional Objective Labs: Basic Metabolic Panel: Recent Labs  Lab 03/01/24 0748 03/05/24 0909  NA 141 134*  K 4.3 3.9  CL 99 92*  CO2 22 22  GLUCOSE 98 116*  BUN 70* 54*  CREATININE 10.53* 9.48*  CALCIUM  9.3 9.2  PHOS 8.6* 7.4*   Liver Function Tests: Recent Labs  Lab 03/01/24 0748 03/05/24 0909  ALBUMIN  3.5 3.4*   No results for input(s): "LIPASE", "AMYLASE" in the last 168 hours. CBC: Recent Labs  Lab 03/01/24 0748 03/05/24 0909  WBC 4.8 8.4  HGB 14.8 10.1*  HCT 46.6 31.1*  MCV 107.9* 106.5*  PLT 121* 166   Blood Culture    Component Value Date/Time   SDES BLOOD BLOOD RIGHT HAND AEROBIC BOTTLE ONLY 02/09/2024 0414   SPECREQUEST  02/09/2024 0414    BOTTLES DRAWN AEROBIC ONLY Blood Culture adequate volume   CULT  02/09/2024 0414    NO GROWTH 5 DAYS Performed at [facility] Lab, [address], [place], [place] [address]    REPTSTATUS 02/14/2024 FINAL 02/09/2024 0414    Cardiac Enzymes: No results for input(s): "CKTOTAL", "CKMB", "CKMBINDEX", "TROPONINI" in the last 168 hours. CBG: Recent Labs  Lab 03/04/24 0805 03/04/24 1150 03/04/24 1628 03/04/24 2107 03/05/24 0809  GLUCAP 98 121* 94 106* 161*   Iron  Studies: No results for input(s): "IRON ", "TIBC", "TRANSFERRIN", "FERRITIN" in the last 72  hours. @lablastinr3 @ Studies/Results: No results found. Medications:  sodium chloride  10 mL/hr at 02/29/24 1700   albumin  human     ceFEPime  (MAXIPIME ) IV 2 g (03/03/24 1800)   promethazine  (PHENERGAN ) injection (IM or IVPB) 12.5 mg (03/04/24 1113)   vancomycin  750 mg (03/03/24 1843)    vitamin C   1,000 mg Oral Daily   atorvastatin   80 mg Oral Daily   bethanechol   10 mg Oral BID   Chlorhexidine  Gluconate Cloth  6 each Topical Q0600   darbepoetin (ARANESP ) injection - DIALYSIS  100 mcg Subcutaneous Q Sat-1800   heparin  injection (subcutaneous)  5,000 Units Subcutaneous Q8H   lactulose   30 g Oral Daily   leptospermum manuka honey  1 Application Topical Daily   levothyroxine   50 mcg Oral QAC breakfast   midodrine   10 mg Oral BID WC   naloxegol  oxalate  12.5 mg Oral Daily   nutrition supplement (JUVEN)  1 packet Oral BID BM   oxyCODONE   30 mg Oral Q8H   polyethylene glycol  17 g Oral BID   senna-docusate  4 tablet Oral BID   sevelamer  carbonate  1,600 mg Oral TID WC   tamsulosin   0.4 mg Oral Daily    Dialysis Orders: TTS [place] 4h  B400  92kg  TDC  Heparin  none - Mircera 225mcg IV q 2 weeks (  last 4/9) - Calcitriol  0.66mcg PO q HD  Assessment/Plan: 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 CT imaging showed T9-10 discitis. He refused MRI, area was too small for aspiration per IR. H/o of foot infection as below. Concerned that this infection may have seeded the spine. Blood Cxs were negative. Not an operative candidate per neurosurgery.  Per ID continue IV Vanc 750mg  and IV Cefepime  2gm with HD x 6 weeks thru 03/23/24 for the discitis.  Left foot osteomyelitis - completed an 8 week course of Dapto/Cefepime  on 3/28 for bacteremia presumed due to foot infection. S/p left TMA on 4/30. Antibiotics as above.  Hyperkalemia - continue renal diet. Improved. ESRD:  Usual TTS schedule. Next HD 03/05/24 HTN/volume: BPs okay, at EDW.  Has some residual UOP, euvolemic on  exam Anemia: HGB 10.1. Continue weekly aranesp . No venofer in setting of infection. Metabolic bone disease: Ca ok, Phos high - continue home binders. 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 Dispo - For SNF placement.   Ramona Burner, PA-C 03/05/2024, 10:49 AM  [facility] Pager: [phone]

## 2024-03-05 NOTE — Plan of Care (Signed)
 Pt has rested quietly throughout the night with no distress noted. Alert and oriented. On room air. No tele monitor on. Voids per urinal but is oliguric. Medicated with extra pain med once with relief noted. No other complaints voiced.     Problem: Metabolic: Goal: Ability to maintain appropriate glucose levels will improve Outcome: Progressing   Problem: Nutritional: Goal: Maintenance of adequate nutrition will improve Outcome: Progressing   Problem: Tissue Perfusion: Goal: Adequacy of tissue perfusion will improve Outcome: Progressing   Problem: Education: Goal: Knowledge of General Education information will improve Description: Including pain rating scale, medication(s)/side effects and non-pharmacologic comfort measures Outcome: Progressing   Problem: Clinical Measurements: Goal: Cardiovascular complication will be avoided Outcome: Progressing   Problem: Coping: Goal: Level of anxiety will decrease Outcome: Progressing   Problem: Pain Managment: Goal: General experience of comfort will improve and/or be controlled Outcome: Progressing

## 2024-03-05 NOTE — Progress Notes (Addendum)
   03/05/24 1240  Vitals  Temp 98 F (36.7 C)  Pulse Rate 71  Resp 11  BP 118/86  SpO2 100 %  O2 Device Room Air  Weight 83.7 kg  Type of Weight Post-Dialysis  Oxygen Therapy  Patient Activity (if Appropriate) In bed  Pulse Oximetry Type Continuous  Oximetry Probe Site Changed No  Post Treatment  Dialyzer Clearance Lightly streaked  Hemodialysis Intake (mL) 100 mL  Liters Processed 78  Fluid Removed (mL) 0 mL  Tolerated HD Treatment Yes  Post-Hemodialysis Comments Pt cramped during treatment requiring 100ml ns bolus.   Received patient in bed to unit.  Alert and oriented.  Informed consent signed and in chart.   TX duration: Three hours and 15 minutes  Patient tolerated well.  Transported back to the room  Alert, without acute distress.  Hand-off given to patient's nurse.   Access used: Right chest HD catheter Access issues: None Medication(s) given: Oxycodone  10mg , 750mg  vancomycin

## 2024-03-05 NOTE — Plan of Care (Signed)
  Problem: Education: Goal: Ability to describe self-care measures that may prevent or decrease complications (Diabetes Survival Skills Education) will improve Outcome: Progressing Goal: Individualized Educational Video(s) Outcome: Progressing   Problem: Coping: Goal: Ability to adjust to condition or change in health will improve Outcome: Progressing   Problem: Fluid Volume: Goal: Ability to maintain a balanced intake and output will improve Outcome: Progressing   Problem: Health Behavior/Discharge Planning: Goal: Ability to identify and utilize available resources and services will improve Outcome: Progressing Goal: Ability to manage health-related needs will improve Outcome: Progressing   Problem: Metabolic: Goal: Ability to maintain appropriate glucose levels will improve Outcome: Progressing   Problem: Nutritional: Goal: Maintenance of adequate nutrition will improve Outcome: Progressing Goal: Progress toward achieving an optimal weight will improve Outcome: Progressing   Problem: Skin Integrity: Goal: Risk for impaired skin integrity will decrease Outcome: Progressing   Problem: Tissue Perfusion: Goal: Adequacy of tissue perfusion will improve Outcome: Progressing   Problem: Education: Goal: Knowledge of General Education information will improve Description: Including pain rating scale, medication(s)/side effects and non-pharmacologic comfort measures Outcome: Progressing   Problem: Health Behavior/Discharge Planning: Goal: Ability to manage health-related needs will improve Outcome: Progressing   Problem: Clinical Measurements: Goal: Ability to maintain clinical measurements within normal limits will improve Outcome: Progressing Goal: Will remain free from infection Outcome: Progressing Goal: Diagnostic test results will improve Outcome: Progressing Goal: Respiratory complications will improve Outcome: Progressing Goal: Cardiovascular complication will  be avoided Outcome: Progressing   Problem: Activity: Goal: Risk for activity intolerance will decrease Outcome: Progressing   Problem: Nutrition: Goal: Adequate nutrition will be maintained Outcome: Progressing   Problem: Coping: Goal: Level of anxiety will decrease Outcome: Progressing   Problem: Elimination: Goal: Will not experience complications related to bowel motility Outcome: Progressing Goal: Will not experience complications related to urinary retention Outcome: Progressing   Problem: Pain Managment: Goal: General experience of comfort will improve and/or be controlled Outcome: Progressing   Problem: Safety: Goal: Ability to remain free from injury will improve Outcome: Progressing   Problem: Skin Integrity: Goal: Risk for impaired skin integrity will decrease Outcome: Progressing   Problem: Education: Goal: Knowledge of the prescribed therapeutic regimen will improve Outcome: Progressing Goal: Ability to verbalize activity precautions or restrictions will improve Outcome: Progressing Goal: Understanding of discharge needs will improve Outcome: Progressing   Problem: Activity: Goal: Ability to perform//tolerate increased activity and mobilize with assistive devices will improve Outcome: Progressing   Problem: Clinical Measurements: Goal: Postoperative complications will be avoided or minimized Outcome: Progressing   Problem: Self-Care: Goal: Ability to meet self-care needs will improve Outcome: Progressing   Problem: Self-Concept: Goal: Ability to maintain and perform role responsibilities to the fullest extent possible will improve Outcome: Progressing   Problem: Pain Management: Goal: Pain level will decrease with appropriate interventions Outcome: Progressing   Problem: Skin Integrity: Goal: Demonstration of wound healing without infection will improve Outcome: Progressing

## 2024-03-06 ENCOUNTER — Inpatient Hospital Stay (HOSPITAL_COMMUNITY)

## 2024-03-06 DIAGNOSIS — E1169 Type 2 diabetes mellitus with other specified complication: Secondary | ICD-10-CM | POA: Diagnosis not present

## 2024-03-06 DIAGNOSIS — I132 Hypertensive heart and chronic kidney disease with heart failure and with stage 5 chronic kidney disease, or end stage renal disease: Secondary | ICD-10-CM | POA: Diagnosis not present

## 2024-03-06 DIAGNOSIS — R571 Hypovolemic shock: Secondary | ICD-10-CM | POA: Diagnosis not present

## 2024-03-06 DIAGNOSIS — M4624 Osteomyelitis of vertebra, thoracic region: Secondary | ICD-10-CM | POA: Diagnosis not present

## 2024-03-06 LAB — GLUCOSE, CAPILLARY
Glucose-Capillary: 90 mg/dL (ref 70–99)
Glucose-Capillary: 91 mg/dL (ref 70–99)
Glucose-Capillary: 118 mg/dL — ABNORMAL HIGH (ref 70–99)

## 2024-03-06 MED ORDER — LIDOCAINE VISCOUS HCL 2 % MT SOLN
15.0000 mL | Freq: Once | OROMUCOSAL | Status: AC
  Administered 2024-03-06: 15 mL via ORAL
  Filled 2024-03-06: qty 15

## 2024-03-06 MED ORDER — ALUM & MAG HYDROXIDE-SIMETH 200-200-20 MG/5ML PO SUSP
30.0000 mL | Freq: Once | ORAL | Status: AC
Start: 2024-03-06 — End: 2024-03-06
  Administered 2024-03-06: 30 mL via ORAL
  Filled 2024-03-06: qty 30

## 2024-03-06 NOTE — Progress Notes (Signed)
 Pt c/o nausea. Zofran  given. No further chest pressure or SOA.

## 2024-03-06 NOTE — Progress Notes (Signed)
 EKG done and MD notified. Sinus arrythmia with 1AVB.

## 2024-03-06 NOTE — Progress Notes (Signed)
 Pt c/o of indigestion/heartburn. MD notified to get something for him. MD ordered maalox and lidocaine  viscous. Ordered lidocaine  from pharmacy.

## 2024-03-06 NOTE — Progress Notes (Signed)
 Maalox/lidocaine  given with relief noted.

## 2024-03-06 NOTE — Progress Notes (Signed)
 CXR done at bedside. MD notified.

## 2024-03-06 NOTE — Progress Notes (Signed)
 [facility] Progress Note   Subjective:   Reports feeling well, no concerns today. Denies SOB, CP, dizziness, nausea.   Objective Vitals:   03/06/24 0420 03/06/24 0500 03/06/24 0600 03/06/24 0800  BP: 95/66   110/82  Pulse: 73   (!) 52  Resp: 12  18 17   Temp: 98.7 F (37.1 C)     TempSrc: Oral     SpO2: 91%   97%  Weight:  88.1 kg    Height:       Physical Exam General: Alert male in NAD Heart: RRR, no murmurs, rubs or gallops  Lungs:CTA bilaterally, respirations unlabored  Abdomen: Soft, non-distended Extremities: R BKA, L TMA, no edema appreciated Dialysis Access:  [facility]  Additional Objective Labs: Basic Metabolic Panel: Recent Labs  Lab 03/01/24 0748 03/05/24 0909  NA 141 134*  K 4.3 3.9  CL 99 92*  CO2 22 22  GLUCOSE 98 116*  BUN 70* 54*  CREATININE 10.53* 9.48*  CALCIUM  9.3 9.2  PHOS 8.6* 7.4*   Liver Function Tests: Recent Labs  Lab 03/01/24 0748 03/05/24 0909  ALBUMIN  3.5 3.4*   No results for input(s): "LIPASE", "AMYLASE" in the last 168 hours. CBC: Recent Labs  Lab 03/01/24 0748 03/05/24 0909  WBC 4.8 8.4  HGB 14.8 10.1*  HCT 46.6 31.1*  MCV 107.9* 106.5*  PLT 121* 166   Blood Culture    Component Value Date/Time   SDES BLOOD BLOOD RIGHT HAND AEROBIC BOTTLE ONLY 02/09/2024 0414   SPECREQUEST  02/09/2024 0414    BOTTLES DRAWN AEROBIC ONLY Blood Culture adequate volume   CULT  02/09/2024 0414    NO GROWTH 5 DAYS Performed at [facility] Lab, [address], [place], [place] [address]    REPTSTATUS 02/14/2024 FINAL 02/09/2024 0414    Cardiac Enzymes: No results for input(s): "CKTOTAL", "CKMB", "CKMBINDEX", "TROPONINI" in the last 168 hours. CBG: Recent Labs  Lab 03/04/24 2107 03/05/24 0809 03/05/24 1625 03/05/24 2037 03/06/24 0717  GLUCAP 106* 161* 111* 145* 90   Iron  Studies: No results for input(s): "IRON ", "TIBC", "TRANSFERRIN", "FERRITIN" in the last 72 hours. @lablastinr3 @ Studies/Results: DG CHEST  PORT 1 VIEW Result Date: 03/06/2024 CLINICAL DATA:  [id number].  Dyspnea. EXAM: PORTABLE CHEST 1 VIEW COMPARISON:  PA and lateral chest 01/10/2024 FINDINGS: Right IJ dialysis catheter again terminates at the superior cavoatrial junction. Median sternotomy sutures are again noted and partially visible lower thoracic spinal hardware. The lungs are expiratory. There are chronic interstitial changes. No focal pneumonia is seen. The sulci are sharp. Cardiomegaly. No vascular congestion is seen. Stable mediastinum with aortic tortuosity and atherosclerosis. Osteopenia. IMPRESSION: 1. Expiratory chest with chronic interstitial changes. No evidence of acute chest disease. 2. Cardiomegaly. 3. Aortic atherosclerosis. Electronically Signed   By: Denman Fischer M.D.   On: 03/06/2024 01:45   Medications:  sodium chloride  10 mL/hr at 02/29/24 1700   albumin  human     ceFEPime  (MAXIPIME ) IV 2 g (03/05/24 1754)   promethazine  (PHENERGAN ) injection (IM or IVPB) 12.5 mg (03/04/24 1113)   vancomycin  750 mg (03/05/24 1129)    vitamin C   1,000 mg Oral Daily   atorvastatin   80 mg Oral Daily   bethanechol   10 mg Oral BID   Chlorhexidine  Gluconate Cloth  6 each Topical Q0600   darbepoetin (ARANESP ) injection - DIALYSIS  100 mcg Subcutaneous Q Sat-1800   heparin  injection (subcutaneous)  5,000 Units Subcutaneous Q8H   lactulose   30 g Oral Daily   leptospermum manuka  honey  1 Application Topical Daily   levothyroxine   50 mcg Oral QAC breakfast   midodrine   10 mg Oral BID WC   naloxegol  oxalate  12.5 mg Oral Daily   nutrition supplement (JUVEN)  1 packet Oral BID BM   oxyCODONE   30 mg Oral Q8H   polyethylene glycol  17 g Oral BID   senna-docusate  4 tablet Oral BID   sevelamer  carbonate  1,600 mg Oral TID WC   tamsulosin   0.4 mg Oral Daily    Dialysis Orders: TTS [place] 4h  B400  92kg  TDC  Heparin  none - Mircera 225mcg IV q 2 weeks (last 4/9) - Calcitriol  0.27mcg PO q HD  Assessment/Plan: Thoracic  discitis/osteomyelitis s/p fall on 4/20. CT imaging showed T9-10 discitis. He refused MRI, area was too small for aspiration per IR. H/o of foot infection as below. Concerned that this infection may have seeded the spine. Blood Cxs were negative. Not an operative candidate per neurosurgery.  Per ID continue IV Vanc 750mg  and IV Cefepime  2gm with HD x 6 weeks thru 03/23/24 for the discitis.  Left foot osteomyelitis - completed an 8 week course of Dapto/Cefepime  on 3/28 for bacteremia presumed due to foot infection. S/p left TMA on 4/30. Antibiotics as above.  Hyperkalemia - continue renal diet. Improved. ESRD:  Usual TTS schedule. Next HD Tuesday HTN/volume: BPs okay, below prior EDW.  Has some residual UOP, euvolemic on exam Anemia: HGB 10.1. Continue weekly aranesp . No venofer in setting of infection. Metabolic bone disease: Ca ok, Phos high - continue home binders. 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 Dispo - For SNF placement.    Ramona Burner, PA-C 03/06/2024, 12:27 PM  [facility] Pager: [phone]

## 2024-03-06 NOTE — Progress Notes (Signed)
 CXR noted. Pt still having chest pressure and now requesting to have a dilaudid  IV shot. MD notified.

## 2024-03-06 NOTE — Progress Notes (Signed)
 PROGRESS NOTE        PATIENT DETAILS Name: Thomas [name] Age: 73 y.o. Sex: male Date of Birth: 09-06-1951 Admit Date: 02/07/2024 Admitting Physician Roxana Copier, DO YQM:VHQION, [name], MD  Brief Summary:  Patient is a 73 y.o.  male who with history of ESRD on HD TTS, history of posterior fusion at T10-12 due to osteomyelitis/discitis/epidural abscess in 2021, history of right BKA who recently completed 6 weeks of daptomycin /cefepime  on 3/28 for left foot infection with osteomyelitis-presented with low back pain-upon further evaluation with CT imaging-he was found to have acute osteomyelitis involving his thoracic spine and admitted to the hospitalist service.  Significant events: 4/20>> admit to TRH-back pain-concern for thoracic osteomyelitis.  Significant studies: 4/21>> x-ray right elbow: No fracture. 4/21>> x-ray left wrist: No fracture 4/21>> x-ray left shoulder, no fracture. 4/21>> CT head: No acute intracranial abnormality 4/21>> CT C-spine: No fracture. 4/21>> CT abdomen/pelvis: No acute injury to chest/abdomen/pelvis 4/21>> CT T-spine: Prior posterior fusion from T10-T12-abnormal appearance with collapse of T10-lucency around T10 screws at the T9-10 disc space, T10-11 disc space concerning for discitis/osteomyelitis. 4/21>> CT L-spine: No acute bony abnormality, mild chronic compression fracture at L1. 4/22>> x-ray left foot: Chronic erosive changes about the forefoot similar to 11/21/2023-osteomyelitis/septic arthritis of the 1st/2nd MTP joints difficult to exclude. 4/25>> left ABI: Within normal limit.  Significant microbiology data: 4/22>> blood culture: No growth  Procedures: 4/30>> left transmetatarsal amputation by Dr. Julio Ohm.  Consults: Neurosurgery Infectious disease Ortho  Subjective:    Patient with some indigestion, nontypical chest pain overnight, where EKG and chest x-ray with no acute findings.  Objective: Vitals: Blood  pressure 110/82, pulse (!) 52, temperature 98.7 F (37.1 C), temperature source Oral, resp. rate 17, height 6\' 5"  (1.956 m), weight 88.1 kg, SpO2 97%.   Exam:  Awake Alert, Oriented X 3, No new F.N deficits, Normal affect Extremities: Left TMA dressing in place, s/p chronic right AKA.   Assessment/Plan:  Acute osteomyelitis with discitis involving T9 to T11 vertebra Per neurosurgery not an operative candidate Per IR-no window to aspirate Plan is for 6 weeks of vancomycin /cefepime  with hemodialysis-EOT 03/23/24 Blood cultures negative so far Back pain better on long and short acting Oxy codon, stop IV Dilaudid , he is in no distress and always found sleeping comfortably Continue to wear TLSO brace when out of the bed. Note patient is on high doses of narcotics chronically, exhibits some narcotic seeking behavior, counseled to avoid excessive use to avoid accidental overdose, as needed Narcan  on board.  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 Chronic left second toe ulceration He just completed 6 weeks of daptomycin /cefepime  on 3/28-has refused amputation in the past.  Concern is that he may have seeded his spine through foot infection.  Subsequently evaluated by Dr. Julio Ohm on 4/28-underwent TMA on 4/30.  Toe-touch weightbearing on that leg.  Transient hypotension on 4/30 Now stable on low-dose Norvasc , clonidine /losartan /metoprolol  remains on hold-continue to monitor and add as appropriate.  Constipation Chronic issue-secondary to narcotics Finally had a BM after several days-remains on MiraLAX /senna-Movantik  added on 5/2. Dulcolax suppository as needed.  Mechanical fall Numerous imaging studies negative for fracture PT/OT eval  ESRD on HD TTS Nephrology following and directing HD care  Hyperkalemia on 02/20/2024 He underwent HD 02/20/2024 afternoon, 2 doses of Lokelma  on 02/22/2024, defer to nephrology  Prolonged QTc Twelve-lead  EKG on 4/30 with normal QTc Telemetry  monitoring  Normocytic anemia Secondary to ESRD Aranesp /iron  deferred to nephrology service.  Chronic HFpEF Euvolemic on exam Fluid removal with HD  HTN See above regarding hypotension BP relatively stable-see above discussion.  HLD Statin  Hypothyroidism Synthroid   BPH Flomax /bethanechol   History of right BKA  Debility/deconditioning For SNF placement, will need clear process as will need to be placed in [place], currently clipped in [place], tolerating dialysis sitting up,   BMI: Estimated body mass index is 23.03 kg/m as calculated from the following:   Height as of this encounter: 6\' 5"  (1.956 m).   Weight as of this encounter: 88.1 kg.   Code status:   Code Status: Full Code   DVT Prophylaxis: SCD's Start: 02/17/24 1538 heparin  injection 5,000 Units Start: 02/10/24 1400 SCDs Start: 02/08/24 2206   Family Communication: Discussed with the patient, none at bedside today   Disposition Plan: Status is: Inpatient He is medically stable for discharge when SNF bed available and [facility] has been obtained   Planned Discharge Destination: SNF   Diet: Diet Order             Diet renal with fluid restriction Fluid restriction: 1200 mL Fluid; Room service appropriate? Yes; Fluid consistency: Thin  Diet effective now                    MEDICATIONS: Scheduled Meds:  vitamin C   1,000 mg Oral Daily   atorvastatin   80 mg Oral Daily   bethanechol   10 mg Oral BID   Chlorhexidine  Gluconate Cloth  6 each Topical Q0600   darbepoetin (ARANESP ) injection - DIALYSIS  100 mcg Subcutaneous Q Sat-1800   heparin  injection (subcutaneous)  5,000 Units Subcutaneous Q8H   lactulose   30 g Oral Daily   leptospermum manuka honey  1 Application Topical Daily   levothyroxine   50 mcg Oral QAC breakfast   midodrine   10 mg Oral BID WC   naloxegol  oxalate  12.5 mg Oral Daily   nutrition supplement (JUVEN)  1 packet Oral BID BM   oxyCODONE   30 mg Oral Q8H   polyethylene  glycol  17 g Oral BID   senna-docusate  4 tablet Oral BID   sevelamer  carbonate  1,600 mg Oral TID WC   tamsulosin   0.4 mg Oral Daily   Continuous Infusions:  sodium chloride  10 mL/hr at 02/29/24 1700   albumin  human     ceFEPime  (MAXIPIME ) IV 2 g (03/05/24 1754)   promethazine  (PHENERGAN ) injection (IM or IVPB) 12.5 mg (03/04/24 1113)   vancomycin  750 mg (03/05/24 1129)   PRN Meds:.acetaminophen  **OR** acetaminophen , bisacodyl , melatonin, naLOXone  (NARCAN )  injection, ondansetron  (ZOFRAN ) IV, oxyCODONE , promethazine  (PHENERGAN ) injection (IM or IVPB), tiZANidine    I have personally reviewed following labs and imaging studies  LABORATORY DATA: CBC: Recent Labs  Lab 03/01/24 0748 03/05/24 0909  WBC 4.8 8.4  HGB 14.8 10.1*  HCT 46.6 31.1*  MCV 107.9* 106.5*  PLT 121* 166    Basic Metabolic Panel: Recent Labs  Lab 03/01/24 0748 03/05/24 0909  NA 141 134*  K 4.3 3.9  CL 99 92*  CO2 22 22  GLUCOSE 98 116*  BUN 70* 54*  CREATININE 10.53* 9.48*  CALCIUM  9.3 9.2  PHOS 8.6* 7.4*    GFR:  Estimated Creatinine Clearance: 9 mL/min (A) (by C-G formula based on SCr of 9.48 mg/dL (H)).  Liver Function Tests: Recent Labs  Lab 03/01/24 0748 03/05/24 0909  ALBUMIN  3.5 3.4*  Urine analysis:     Component Value Date/Time   COLORURINE YELLOW 01/14/2022 0248   APPEARANCEUR HAZY (A) 01/14/2022 0248   LABSPEC 1.009 01/14/2022 0248   PHURINE 7.0 01/14/2022 0248   GLUCOSEU NEGATIVE 01/14/2022 0248   HGBUR MODERATE (A) 01/14/2022 0248   BILIRUBINUR NEGATIVE 01/14/2022 0248   KETONESUR NEGATIVE 01/14/2022 0248   PROTEINUR 100 (A) 01/14/2022 0248   NITRITE NEGATIVE 01/14/2022 0248   LEUKOCYTESUR LARGE (A) 01/14/2022 0248    Sepsis Labs:  Lactic Acid, Venous    Component Value Date/Time   LATICACIDVEN 1.0 12/31/2021 0048    LOS: 27 days   Signature  -    Seena Dadds M.D on 03/06/2024 at 2:00 PM   -  To page go to [email or web address]

## 2024-03-06 NOTE — Progress Notes (Signed)
 Pt called out c/o being short of air. Upon entering room he also c/o having some chest pressure with shortness of breath. 137/85. 81-17. 97% on room air. MD notified and ordering EKG.

## 2024-03-06 NOTE — Progress Notes (Signed)
 Oxy given per MD order, no dilaudid .

## 2024-03-06 NOTE — Plan of Care (Signed)
 Pt has rested quietly throughout the night with no distress noted. Alert and oriented. On room air. Pt placed back on tele and in Sinus arrythmia 1AVB after his episode of chest pressure with shortness of breath. (See previous note) Oliguria/voids per urinal. Medicated once with prn oxy with relief noted. Also given zofran  for nausea. No other complaints voiced.     Problem: Tissue Perfusion: Goal: Adequacy of tissue perfusion will improve Outcome: Progressing   Problem: Clinical Measurements: Goal: Ability to maintain clinical measurements within normal limits will improve Outcome: Progressing Goal: Respiratory complications will improve Outcome: Progressing Goal: Cardiovascular complication will be avoided Outcome: Progressing   Problem: Nutrition: Goal: Adequate nutrition will be maintained Outcome: Progressing   Problem: Pain Managment: Goal: General experience of comfort will improve and/or be controlled Outcome: Progressing

## 2024-03-07 DIAGNOSIS — M4624 Osteomyelitis of vertebra, thoracic region: Secondary | ICD-10-CM | POA: Diagnosis not present

## 2024-03-07 LAB — GLUCOSE, CAPILLARY
Glucose-Capillary: 86 mg/dL (ref 70–99)
Glucose-Capillary: 173 mg/dL — ABNORMAL HIGH (ref 70–99)
Glucose-Capillary: 98 mg/dL (ref 70–99)
Glucose-Capillary: 234 mg/dL — ABNORMAL HIGH (ref 70–99)

## 2024-03-07 MED ORDER — CHLORHEXIDINE GLUCONATE CLOTH 2 % EX PADS
6.0000 | MEDICATED_PAD | Freq: Every day | CUTANEOUS | Status: DC
Start: 2024-03-08 — End: 2024-03-11
  Administered 2024-03-08 – 2024-03-11 (×4): 6 via TOPICAL

## 2024-03-07 NOTE — Plan of Care (Signed)
 Pt has rested quietly throughout the night with no distress noted. Alert and oriented. On room air. Not on tele now. Oliguric/voids per urinal. Medicated once for prn pain with relief noted and zanaflex  for spasms with relief noted. No other complaints voiced.     Problem: Tissue Perfusion: Goal: Adequacy of tissue perfusion will improve Outcome: Progressing   Problem: Education: Goal: Knowledge of General Education information will improve Description: Including pain rating scale, medication(s)/side effects and non-pharmacologic comfort measures Outcome: Progressing   Problem: Clinical Measurements: Goal: Respiratory complications will improve Outcome: Progressing Goal: Cardiovascular complication will be avoided Outcome: Progressing   Problem: Pain Managment: Goal: General experience of comfort will improve and/or be controlled Outcome: Progressing

## 2024-03-07 NOTE — Progress Notes (Addendum)
 [facility] Progress Note   Subjective:   Patient seen in his room this morning. He was eating breakfast, and he reports that this is the first time he has been able to eat since Friday. He denies any dyspnea, CP, abdominal pain, or dizziness. Had HD on 03/05/24 and reports that he was cramping badly during treatment. Next HD is 03/08/24.  Patient has tentative plans to d/c to [facility] and will get HD at [facility] on MWF to continue his HD while he is in SNF.   Objective Vitals:   03/06/24 2200 03/07/24 0052 03/07/24 0812 03/07/24 1151  BP:   128/87 105/77  Pulse:  64    Resp: 18  18 13   Temp:  98 F (36.7 C) 98 F (36.7 C) (!) 97.5 F (36.4 C)  TempSrc:  Oral Oral Oral  SpO2:  99%    Weight:      Height:       Physical Exam General: Alert male in NAD Heart: RRR, no murmurs, rubs or gallops  Lungs:CTA bilaterally, respirations unlabored  Abdomen: Soft, non-distended Extremities: R BKA, L TMA, no edema appreciated Dialysis Access:  [facility]  Additional Objective Labs: Basic Metabolic Panel: Recent Labs  Lab 03/01/24 0748 03/05/24 0909  NA 141 134*  K 4.3 3.9  CL 99 92*  CO2 22 22  GLUCOSE 98 116*  BUN 70* 54*  CREATININE 10.53* 9.48*  CALCIUM  9.3 9.2  PHOS 8.6* 7.4*   Liver Function Tests: Recent Labs  Lab 03/01/24 0748 03/05/24 0909  ALBUMIN  3.5 3.4*   No results for input(s): "LIPASE", "AMYLASE" in the last 168 hours. CBC: Recent Labs  Lab 03/01/24 0748 03/05/24 0909  WBC 4.8 8.4  HGB 14.8 10.1*  HCT 46.6 31.1*  MCV 107.9* 106.5*  PLT 121* 166   Blood Culture    Component Value Date/Time   SDES BLOOD BLOOD RIGHT HAND AEROBIC BOTTLE ONLY 02/09/2024 0414   SPECREQUEST  02/09/2024 0414    BOTTLES DRAWN AEROBIC ONLY Blood Culture adequate volume   CULT  02/09/2024 0414    NO GROWTH 5 DAYS Performed at [facility], [address] [place], [place] [address]    REPTSTATUS 02/14/2024 FINAL 02/09/2024 0414    Cardiac Enzymes: No  results for input(s): "CKTOTAL", "CKMB", "CKMBINDEX", "TROPONINI" in the last 168 hours. CBG: Recent Labs  Lab 03/06/24 0717 03/06/24 1312 03/06/24 1712 03/07/24 0808 03/07/24 1152  GLUCAP 90 91 118* 86 173*   Iron  Studies: No results for input(s): "IRON ", "TIBC", "TRANSFERRIN", "FERRITIN" in the last 72 hours. @lablastinr3 @ Studies/Results: DG CHEST PORT 1 VIEW Result Date: 03/06/2024 CLINICAL DATA:  [id number].  Dyspnea. EXAM: PORTABLE CHEST 1 VIEW COMPARISON:  PA and lateral chest 01/10/2024 FINDINGS: Right IJ dialysis catheter again terminates at the superior cavoatrial junction. Median sternotomy sutures are again noted and partially visible lower thoracic spinal hardware. The lungs are expiratory. There are chronic interstitial changes. No focal pneumonia is seen. The sulci are sharp. Cardiomegaly. No vascular congestion is seen. Stable mediastinum with aortic tortuosity and atherosclerosis. Osteopenia. IMPRESSION: 1. Expiratory chest with chronic interstitial changes. No evidence of acute chest disease. 2. Cardiomegaly. 3. Aortic atherosclerosis. Electronically Signed   By: Denman Fischer M.D.   On: 03/06/2024 01:45   Medications:  sodium chloride  10 mL/hr at 02/29/24 1700   albumin  human     ceFEPime  (MAXIPIME ) IV 2 g (03/05/24 1754)   promethazine  (PHENERGAN ) injection (IM or IVPB) 12.5 mg (03/04/24 1113)   vancomycin  750  mg (03/05/24 1129)    vitamin C   1,000 mg Oral Daily   atorvastatin   80 mg Oral Daily   bethanechol   10 mg Oral BID   Chlorhexidine  Gluconate Cloth  6 each Topical Q0600   darbepoetin (ARANESP ) injection - DIALYSIS  100 mcg Subcutaneous Q Sat-1800   heparin  injection (subcutaneous)  5,000 Units Subcutaneous Q8H   lactulose   30 g Oral Daily   leptospermum manuka honey  1 Application Topical Daily   levothyroxine   50 mcg Oral QAC breakfast   midodrine   10 mg Oral BID WC   naloxegol  oxalate  12.5 mg Oral Daily   nutrition supplement (JUVEN)  1 packet Oral BID BM    oxyCODONE   30 mg Oral Q8H   polyethylene glycol  17 g Oral BID   senna-docusate  4 tablet Oral BID   sevelamer  carbonate  1,600 mg Oral TID WC   tamsulosin   0.4 mg Oral Daily    Dialysis Orders: TTS [place] 4h  B400  92kg  TDC  Heparin  none - Mircera 225mcg IV q 2 weeks (last 4/9) - Calcitriol  0.20mcg PO q HD  Assessment/Plan: 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 CT imaging showed T9-10 discitis. He refused MRI, area was too small for aspiration per IR. H/o of foot infection as below. Concerned that this infection may have seeded the spine. Blood Cxs were negative. Not an operative candidate per neurosurgery.  Per ID continue IV Vanc 750mg  and IV Cefepime  2gm with HD x 6 weeks thru 03/23/24 for the discitis.  Left foot osteomyelitis - completed an 8 week course of Dapto/Cefepime  on 3/28 for bacteremia presumed due to foot infection. S/p left TMA on 4/30. Antibiotics as above.  Hyperkalemia - continue renal diet. Improved. K 3.9.  ESRD:  Usual TTS schedule. Next HD 03/08/24 HTN/volume: BPs okay, below prior EDW.  Has some residual UOP, euvolemic on exam Anemia: HGB 10.1. Continue weekly aranesp . No venofer in setting of infection. Metabolic bone disease: Ca ok, Phos high - continue home binders. 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 Dispo - For SNF placement   Hersey Lorenzo, NP-C 03/07/2024, 1:00 PM  [facility]   Seen and examined independently.  Agree with note and exam as documented above by Hersey Lorenzo, NP and as noted here.  General elderly male in bed in no acute distress HEENT normocephalic atraumatic extraocular movements intact sclera anicteric Neck supple trachea midline Lungs clear to auscultation bilaterally normal work of breathing at rest  Heart S1S2 no rub  Abdomen soft nontender nondistended Extremities trace edema  Psych normal mood and affect Neuro alert and conversant;  follows commands and provides hx  Access RIJ tunn catheter in place; states LUE access never worked   ESRD - continue HD per TTS schedule for now while here and then on discharge will transition to MWF schedule. Renal panel and CBC in AM   Thoracic osteo/discitis - abx as above. Per ID continue IV Vanc 750mg  and IV Cefepime  2gm with HD x 6 weeks thru 03/23/24  Anemia of CKD - on weekly ESA  Nan Aver, MD 03/07/2024  3:00 PM

## 2024-03-07 NOTE — Progress Notes (Signed)
 Pt has been accepted at [facility] while at snf for rehab. Pt will have a MWF 11:15 am chair time. Pt can start on Wed and will need to arrive at 10:30 am for paperwork prior to treatment. CSW made aware of this info and confirms snf can accommodate. Update provided to nephrologist and renal NP as well. Will add appts to AVS and assist as needed.   Lauraine Polite Renal Navigator [phone]

## 2024-03-07 NOTE — TOC Progression Note (Addendum)
 Transition of Care [facility]) - Progression Note    Patient Details  Name: Thomas [name] MRN: [id number] Date of Birth: [birth year]  Transition of Care [facility]) CM/SW Contact  Jannice Mends, LCSW Phone Number: 03/07/2024, 1:20 PM  Clinical Narrative:    1:20pm-CSW confirmed chair time with Frosty Jews and will begin insurance process once updated PT note is in; PT to see patient today.    4:50 PM-CSW initiated insurance authorization, Ref# [id number].  Expected Discharge Plan: Skilled Nursing Facility Barriers to Discharge: Continued Medical Work up, English as a second language teacher (OP HD chair)  Expected Discharge Plan and Services     Post Acute Care Choice: Skilled Nursing Facility Living arrangements for the past 2 months: Single Family Home                                       Social Determinants of Health (SDOH) Interventions SDOH Screenings   Food Insecurity: No Food Insecurity (02/09/2024)  Housing: Low Risk  (02/09/2024)  Transportation Needs: No Transportation Needs (02/09/2024)  Utilities: Not At Risk (02/09/2024)  Depression (PHQ2-9): Low Risk  (02/08/2020)  Social Connections: Socially Isolated (02/11/2024)  Tobacco Use: Low Risk  (02/07/2024)    Readmission Risk Interventions    01/15/2022    2:56 PM 01/02/2022    2:03 PM 12/31/2021   10:32 AM  Readmission Risk Prevention Plan  Transportation Screening Complete Complete Complete  Medication Review Oceanographer) Complete Complete Complete  PCP or Specialist appointment within 3-5 days of discharge Complete Complete Complete  HRI or Home Care Consult Complete Complete Complete  SW Recovery Care/Counseling Consult Complete Complete Complete  Palliative Care Screening Not Applicable Not Applicable Not Applicable  Skilled Nursing Facility Not Applicable Not Applicable Not Applicable

## 2024-03-07 NOTE — Progress Notes (Signed)
 Occupational Therapy Treatment Patient Details Name: Thomas [name] MRN: [id number] DOB: 1951-05-08 Today's Date: 03/07/2024   History of present illness The pt is a 73 yo male presenting 4/20 with back pain after a fall at home. 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 PMH includes: ESRD on HD TTS, prior thoracic spine fusion, spinal 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-T12, chronic thoracic osteomyelitis, chronic pain syndrome, gout, PE not on anticoagulation, DM-2, right BKA, left second toe ulcer and HTN   OT comments  Pt making good progress towards OT goals, met 2/4 and upgraded accordingly. Session focused on compensatory strategies for bathing/dressing bed level with no more than Min A required. Pt also reports performing theraband HEP and AROM LE exercises outside of therapy sessions. Pt verbalized high pain levels in back but able to tolerate session well.       If plan is discharge home, recommend the following:  A lot of help with walking and/or transfers;A lot of help with bathing/dressing/bathroom;Assist for transportation;Assistance with cooking/housework   Equipment Recommendations  Hospital bed    Recommendations for Other Services      Precautions / Restrictions Precautions Precautions: Back;Fall Precaution Booklet Issued: Yes (comment) Recall of Precautions/Restrictions: Impaired Required Braces or Orthoses: Spinal Brace;Other Brace Spinal Brace: Thoracolumbosacral orthotic;Applied in sitting position Other Brace: Post op shoe Restrictions Weight Bearing Restrictions Per Provider Order: Yes LLE Weight Bearing Per Provider Order: Touchdown weight bearing Other Position/Activity Restrictions: Post op shoe       Mobility Bed Mobility Overal bed mobility: Modified Independent Bed Mobility: Rolling Rolling: Modified independent (Device/Increase time)         General bed mobility comments: pt rolling side to side for peri care  w/o assist. Pt did bridge during session for bed pad change w/ decreased compliance for TDWB LLE during this    Transfers                   General transfer comment: deferred until later     Balance                                           ADL either performed or assessed with clinical judgement   ADL Overall ADL's : Needs assistance/impaired     Grooming: Set up;Bed level;Wash/dry face   Upper Body Bathing: Minimal assistance;Bed level Upper Body Bathing Details (indicate cue type and reason): assist for back Lower Body Bathing: Bed level;Minimal assistance Lower Body Bathing Details (indicate cue type and reason): pt able to wash BLE, peri region bed level. noted with bowel incontinence from earlier w/ pt unaware. able to clean self w/ Min A for thoroughness Upper Body Dressing : Set up;Bed level                     General ADL Comments: Emphasis on washing up w/ pt managing most of task bed level with light assist intermittently    Extremity/Trunk Assessment Upper Extremity Assessment Upper Extremity Assessment: Overall WFL for tasks assessed;Right hand dominant   Lower Extremity Assessment Lower Extremity Assessment: Defer to PT evaluation        Vision   Vision Assessment?: No apparent visual deficits   Perception     Praxis     Communication Communication Communication: No apparent difficulties Factors Affecting Communication: Reduced clarity of speech   Cognition Arousal: Alert Behavior  During Therapy: WFL for tasks assessed/performed, Flat affect Cognition: No family/caregiver present to determine baseline             OT - Cognition Comments: likely at baseline                 Following commands: Intact        Cueing   Cueing Techniques: Verbal cues, Gestural cues  Exercises      Shoulder Instructions       General Comments HR briefly reading 150s-190s but likely artifact as lead fallen off and  once replaced - reading 80s bpm    Pertinent Vitals/ Pain       Pain Assessment Pain Assessment: Faces Faces Pain Scale: Hurts a little bit Pain Location: back Pain Descriptors / Indicators: Grimacing, Guarding Pain Intervention(s): Monitored during session, Limited activity within patient's tolerance  Home Living                                          Prior Functioning/Environment              Frequency  Min 1X/week        Progress Toward Goals  OT Goals(current goals can now be found in the care plan section)  Progress towards OT goals: Progressing toward goals  Acute Rehab OT Goals Patient Stated Goal: go to a rehab with 4 or 5 stars OT Goal Formulation: With patient Time For Goal Achievement: 03/21/24 Potential to Achieve Goals: Fair  Plan      Co-evaluation                 AM-PAC OT "6 Clicks" Daily Activity     Outcome Measure   Help from another person eating meals?: None Help from another person taking care of personal grooming?: A Little Help from another person toileting, which includes using toliet, bedpan, or urinal?: A Lot Help from another person bathing (including washing, rinsing, drying)?: A Little Help from another person to put on and taking off regular upper body clothing?: A Little Help from another person to put on and taking off regular lower body clothing?: A Lot 6 Click Score: 17    End of Session    OT Visit Diagnosis: Unsteadiness on feet (R26.81);Repeated falls (R29.6);History of falling (Z91.81);Muscle weakness (generalized) (M62.81);Pain   Activity Tolerance Patient tolerated treatment well   Patient Left in bed;with call bell/phone within reach;with bed alarm set   Nurse Communication Mobility status;Other (comment) (pt requesting juice)        Time: 1610-9604 OT Time Calculation (min): 42 min  Charges: OT General Charges $OT Visit: 1 Visit OT Treatments $Self Care/Home Management : 23-37  mins $Therapeutic Activity: 8-22 mins  Lawrence Pretty, OTR/L Acute Rehab Services Office: [phone]   Thomas [name] 03/07/2024, 10:54 AM

## 2024-03-07 NOTE — Progress Notes (Signed)
 Physical Therapy Treatment Patient Details Name: Thomas [name] MRN: [id number] DOB: 1951-07-29 Today's Date: 03/07/2024   History of Present Illness The pt is a 73 yo male presenting 4/20 with back pain after a fall at home. 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 PMH includes: ESRD on HD TTS, prior thoracic spine fusion, spinal 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-T12, chronic thoracic osteomyelitis, chronic pain syndrome, gout, PE not on anticoagulation, DM-2, right BKA, left second toe ulcer and HTN    PT Comments  Agreeable to therapy session today. Needed assist with bed level peri-care. Pt attempting to perform self-care hygiene when PT entered room after BM however was having great difficulty. Progressed with OOB mobility, transfer training to recliner from bed with lateral scoot technique. Good UE strength to clear buttocks multiple times, min assist to support LLE to prevent WB more than TDWB precautions. Does not agree to wear TLSO. No verbalization or signs indicating increase in pain level with activities today. Patient will continue to benefit from skilled physical therapy services to further improve independence with functional mobility. Patient will benefit from continued inpatient follow up therapy, <3 hours/day.    If plan is discharge home, recommend the following: Two people to help with walking and/or transfers;Two people to help with bathing/dressing/bathroom;Assistance with cooking/housework;Assistance with feeding;Direct supervision/assist for medications management;Direct supervision/assist for financial management;Assist for transportation;Help with stairs or ramp for entrance   Can travel by private vehicle     No  Equipment Recommendations  Hospital bed (wheelchair (but pt states it will not fit in his home))    Recommendations for Other Services       Precautions / Restrictions Precautions Precautions: Back;Fall Precaution  Booklet Issued: Yes (comment) Recall of Precautions/Restrictions: Impaired Precaution/Restrictions Comments: pt initially unable to recall; handout provided to reinforce Required Braces or Orthoses: Spinal Brace;Other Brace Spinal Brace: Thoracolumbosacral orthotic;Applied in sitting position Other Brace: Post op shoe Restrictions Weight Bearing Restrictions Per Provider Order: Yes LLE Weight Bearing Per Provider Order: Touchdown weight bearing Other Position/Activity Restrictions: Post op shoe     Mobility  Bed Mobility Overal bed mobility: Modified Independent Bed Mobility: Rolling Rolling: Modified independent (Device/Increase time) Sidelying to sit: Supervision       General bed mobility comments: Able to roll without assist, performing self per-care but in an unhygenic way, assisted pt as needed. Able to rise to EOB without assist. refuses TLSO. Allowed donning of post-op shoe.    Transfers Overall transfer level: Needs assistance Equipment used: None Transfers: Bed to chair/wheelchair/BSC            Lateral/Scoot Transfers: Min assist General transfer comment: Min assist to support LLE to prevent WB beyond TDWB. Wearing post-op shoe. Able to scoot, lifts buttocks well to reduce friction. Bowel urgency as soon as pt reached chair, assisted with bed pan to utilize while staff changed soiled sheets.    Ambulation/Gait                   Stairs             Wheelchair Mobility     Tilt Bed    Modified Rankin (Stroke Patients Only)       Balance Overall balance assessment: History of Falls, Needs assistance Sitting-balance support: No upper extremity supported Sitting balance-[name]: Fair Sitting balance - Comments: Sits EOB unsupported, supervision for safety.  Communication Communication Communication: No apparent difficulties  Cognition Arousal: Alert Behavior During Therapy: WFL for  tasks assessed/performed, Flat affect   PT - Cognitive impairments: No family/caregiver present to determine baseline                         Following commands: Intact      Cueing Cueing Techniques: Verbal cues, Gestural cues  Exercises Other Exercises Other Exercises: Pt teaches back LE exercises appropriately, demonstrates understanding, and states he has been consistent.    General Comments General comments (skin integrity, edema, etc.): Pt attempting self care after BM when PT entered room but getting feces everywhere in the bed. Assisted with care.      Pertinent Vitals/Pain Pain Assessment Pain Assessment: Faces Faces Pain Scale: Hurts little more Pain Location: back Pain Descriptors / Indicators: Grimacing, Guarding Pain Intervention(s): Monitored during session, Repositioned    Home Living                          Prior Function            PT Goals (current goals can now be found in the care plan section) Acute Rehab PT Goals Patient Stated Goal: to reduce pain PT Goal Formulation: With patient Time For Goal Achievement: 03/21/24 Potential to Achieve Goals: Fair Progress towards PT goals: Progressing toward goals    Frequency    Min 2X/week      PT Plan      Co-evaluation              AM-PAC PT "6 Clicks" Mobility   Outcome Measure  Help needed turning from your back to your side while in a flat bed without using bedrails?: None Help needed moving from lying on your back to sitting on the side of a flat bed without using bedrails?: A Little Help needed moving to and from a bed to a chair (including a wheelchair)?: A Little Help needed standing up from a chair using your arms (e.g., wheelchair or bedside chair)?: Total Help needed to walk in hospital room?: Total Help needed climbing 3-5 steps with a railing? : Total 6 Click Score: 13    End of Session Equipment Utilized During Treatment: Gait belt Activity Tolerance:  Patient tolerated treatment well Patient left: with call bell/phone within reach;in chair;with chair alarm set;with nursing/sitter in room Nurse Communication: Mobility status PT Visit Diagnosis: Unsteadiness on feet (R26.81);Other abnormalities of gait and mobility (R26.89);Repeated falls (R29.6);Muscle weakness (generalized) (M62.81);Pain;Difficulty in walking, not elsewhere classified (R26.2);History of falling (Z91.81) Pain - part of body:  (back)     Time: 4098-1191 PT Time Calculation (min) (ACUTE ONLY): 16 min  Charges:    $Therapeutic Activity: 8-22 mins PT General Charges $$ ACUTE PT VISIT: 1 Visit                     Jory Ng, PT, DPT [facility]  Rehabilitation Services Physical Therapist Office: [phone] Website: [email or web address]    Thomas [name] 03/07/2024, 4:31 PM

## 2024-03-07 NOTE — Progress Notes (Signed)
 PROGRESS NOTE        PATIENT DETAILS Name: Thomas [name] Age: 73 y.o. Sex: male Date of Birth: 1950/11/16 Admit Date: 02/07/2024 Admitting Physician Roxana Copier, DO RUE:AVWUJW, [name], MD  Brief Summary:  Patient is a 74 y.o.  male who with history of ESRD on HD TTS, history of posterior fusion at T10-12 due to osteomyelitis/discitis/epidural abscess in 2021, history of right BKA who recently completed 6 weeks of daptomycin /cefepime  on 3/28 for left foot infection with osteomyelitis-presented with low back pain-upon further evaluation with CT imaging-he was found to have acute osteomyelitis involving his thoracic spine and admitted to the hospitalist service.  Significant events: 4/20>> admit to TRH-back pain-concern for thoracic osteomyelitis.  Significant studies: 4/21>> x-ray right elbow: No fracture. 4/21>> x-ray left wrist: No fracture 4/21>> x-ray left shoulder, no fracture. 4/21>> CT head: No acute intracranial abnormality 4/21>> CT C-spine: No fracture. 4/21>> CT abdomen/pelvis: No acute injury to chest/abdomen/pelvis 4/21>> CT T-spine: Prior posterior fusion from T10-T12-abnormal appearance with collapse of T10-lucency around T10 screws at the T9-10 disc space, T10-11 disc space concerning for discitis/osteomyelitis. 4/21>> CT L-spine: No acute bony abnormality, mild chronic compression fracture at L1. 4/22>> x-ray left foot: Chronic erosive changes about the forefoot similar to 11/21/2023-osteomyelitis/septic arthritis of the 1st/2nd MTP joints difficult to exclude. 4/25>> left ABI: Within normal limit.  Significant microbiology data: 4/22>> blood culture: No growth  Procedures: 4/30>> left transmetatarsal amputation by Dr. Julio Ohm.  Consults: Neurosurgery Infectious disease Ortho  Subjective:    No significant events overnight, he denies any complaints today  Objective: Vitals: Blood pressure 105/77, pulse 64, temperature (!) 97.5 F  (36.4 C), temperature source Oral, resp. rate 13, height 6\' 5"  (1.956 m), weight 88.1 kg, SpO2 99%.   Exam:  Wake, alert, oriented, no apparent distress Extremities: Left TMA dressing in place, s/p chronic right AKA.   Assessment/Plan:  Acute osteomyelitis with discitis involving T9 to T11 vertebra Per neurosurgery not an operative candidate Per IR-no window to aspirate Plan is for 6 weeks of vancomycin /cefepime  with hemodialysis-EOT 03/23/24 Blood cultures negative so far Back pain better on long and short acting Oxy codon, stop IV Dilaudid , he is in no distress and always found sleeping comfortably Continue to wear TLSO brace when out of the bed. Note patient is on high doses of narcotics chronically, exhibits some narcotic seeking behavior, counseled to avoid excessive use to avoid accidental overdose, as needed Narcan  on board.  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 Chronic left second toe ulceration He just completed 6 weeks of daptomycin /cefepime  on 3/28-has refused amputation in the past.  Concern is that he may have seeded his spine through foot infection.  Subsequently evaluated by Dr. Julio Ohm on 4/28-underwent TMA on 4/30.  Toe-touch weightbearing on that leg.  Transient hypotension on 4/30 Now stable on low-dose Norvasc , clonidine /losartan /metoprolol  remains on hold-continue to monitor and add as appropriate.  Constipation Chronic issue-secondary to narcotics Finally had a BM after several days-remains on MiraLAX /senna-Movantik  added on 5/2. Dulcolax suppository as needed.  Mechanical fall Numerous imaging studies negative for fracture PT/OT eval  ESRD on HD TTS Nephrology following and directing HD care  Hyperkalemia on 02/20/2024 He underwent HD 02/20/2024 afternoon, 2 doses of Lokelma  on 02/22/2024, defer to nephrology  Prolonged QTc Twelve-lead EKG on 4/30 with normal QTc Telemetry monitoring  Normocytic anemia Secondary to  ESRD Aranesp /iron   deferred to nephrology service.  Chronic HFpEF Euvolemic on exam Fluid removal with HD  HTN See above regarding hypotension BP relatively stable-see above discussion.  HLD Statin  Hypothyroidism Synthroid   BPH Flomax /bethanechol   History of right BKA  Debility/deconditioning For SNF placement, will need clear process as will need to be placed in [place], currently clipped in [place], tolerating dialysis sitting up,   BMI: Estimated body mass index is 23.03 kg/m as calculated from the following:   Height as of this encounter: 6\' 5"  (1.956 m).   Weight as of this encounter: 88.1 kg.   Code status:   Code Status: Full Code   DVT Prophylaxis: SCD's Start: 02/17/24 1538 heparin  injection 5,000 Units Start: 02/10/24 1400 SCDs Start: 02/08/24 2206   Family Communication: Discussed with the patient, none at bedside today   Disposition Plan: Status is: Inpatient He is medically stable for discharge when SNF bed available and [facility] has been obtained   Planned Discharge Destination: SNF   Diet: Diet Order             Diet regular Fluid consistency: Thin  Diet effective now                    MEDICATIONS: Scheduled Meds:  vitamin C   1,000 mg Oral Daily   atorvastatin   80 mg Oral Daily   bethanechol   10 mg Oral BID   Chlorhexidine  Gluconate Cloth  6 each Topical Q0600   [START ON 03/08/2024] Chlorhexidine  Gluconate Cloth  6 each Topical Q0600   darbepoetin (ARANESP ) injection - DIALYSIS  100 mcg Subcutaneous Q Sat-1800   heparin  injection (subcutaneous)  5,000 Units Subcutaneous Q8H   lactulose   30 g Oral Daily   leptospermum manuka honey  1 Application Topical Daily   levothyroxine   50 mcg Oral QAC breakfast   midodrine   10 mg Oral BID WC   naloxegol  oxalate  12.5 mg Oral Daily   nutrition supplement (JUVEN)  1 packet Oral BID BM   oxyCODONE   30 mg Oral Q8H   polyethylene glycol  17 g Oral BID   senna-docusate  4 tablet Oral BID   sevelamer   carbonate  1,600 mg Oral TID WC   tamsulosin   0.4 mg Oral Daily   Continuous Infusions:  sodium chloride  10 mL/hr at 02/29/24 1700   albumin  human     ceFEPime  (MAXIPIME ) IV 2 g (03/05/24 1754)   promethazine  (PHENERGAN ) injection (IM or IVPB) 12.5 mg (03/04/24 1113)   vancomycin  750 mg (03/05/24 1129)   PRN Meds:.acetaminophen  **OR** acetaminophen , bisacodyl , melatonin, naLOXone  (NARCAN )  injection, ondansetron  (ZOFRAN ) IV, oxyCODONE , promethazine  (PHENERGAN ) injection (IM or IVPB), tiZANidine    I have personally reviewed following labs and imaging studies  LABORATORY DATA: CBC: Recent Labs  Lab 03/01/24 0748 03/05/24 0909  WBC 4.8 8.4  HGB 14.8 10.1*  HCT 46.6 31.1*  MCV 107.9* 106.5*  PLT 121* 166    Basic Metabolic Panel: Recent Labs  Lab 03/01/24 0748 03/05/24 0909  NA 141 134*  K 4.3 3.9  CL 99 92*  CO2 22 22  GLUCOSE 98 116*  BUN 70* 54*  CREATININE 10.53* 9.48*  CALCIUM  9.3 9.2  PHOS 8.6* 7.4*    GFR:  Estimated Creatinine Clearance: 9 mL/min (A) (by C-G formula based on SCr of 9.48 mg/dL (H)).  Liver Function Tests: Recent Labs  Lab 03/01/24 0748 03/05/24 0909  ALBUMIN  3.5 3.4*    Urine analysis:     Component Value Date/Time  COLORURINE YELLOW 01/14/2022 0248   APPEARANCEUR HAZY (A) 01/14/2022 0248   LABSPEC 1.009 01/14/2022 0248   PHURINE 7.0 01/14/2022 0248   GLUCOSEU NEGATIVE 01/14/2022 0248   HGBUR MODERATE (A) 01/14/2022 0248   BILIRUBINUR NEGATIVE 01/14/2022 0248   KETONESUR NEGATIVE 01/14/2022 0248   PROTEINUR 100 (A) 01/14/2022 0248   NITRITE NEGATIVE 01/14/2022 0248   LEUKOCYTESUR LARGE (A) 01/14/2022 0248    Sepsis Labs:  Lactic Acid, Venous    Component Value Date/Time   LATICACIDVEN 1.0 12/31/2021 0048    LOS: 28 days   Signature  -    Seena Dadds M.D on 03/07/2024 at 2:18 PM   -  To page go to [email or web address]

## 2024-03-08 DIAGNOSIS — M4624 Osteomyelitis of vertebra, thoracic region: Secondary | ICD-10-CM | POA: Diagnosis not present

## 2024-03-08 LAB — RENAL FUNCTION PANEL
Albumin: 3.5 g/dL (ref 3.5–5.0)
Anion gap: 20 — ABNORMAL HIGH (ref 5–15)
BUN: 64 mg/dL — ABNORMAL HIGH (ref 8–23)
CO2: 20 mmol/L — ABNORMAL LOW (ref 22–32)
Calcium: 8.6 mg/dL — ABNORMAL LOW (ref 8.9–10.3)
Chloride: 98 mmol/L (ref 98–111)
Creatinine, Ser: 9.95 mg/dL — ABNORMAL HIGH (ref 0.61–1.24)
GFR, Estimated: 5 mL/min — ABNORMAL LOW (ref >60)
Glucose, Bld: 100 mg/dL — ABNORMAL HIGH (ref 70–99)
Phosphorus: 7.8 mg/dL — ABNORMAL HIGH (ref 2.5–4.6)
Potassium: 4.8 mmol/L (ref 3.5–5.1)
Sodium: 138 mmol/L (ref 135–145)

## 2024-03-08 LAB — CBC
HCT: 33.9 % — ABNORMAL LOW (ref 39.0–52.0)
Hemoglobin: 11 g/dL — ABNORMAL LOW (ref 13.0–17.0)
MCH: 34.9 pg — ABNORMAL HIGH (ref 26.0–34.0)
MCHC: 32.4 g/dL (ref 30.0–36.0)
MCV: 107.6 fL — ABNORMAL HIGH (ref 80.0–100.0)
Platelets: 169 10*3/uL (ref 150–400)
RBC: 3.15 MIL/uL — ABNORMAL LOW (ref 4.22–5.81)
RDW: 15.4 % (ref 11.5–15.5)
WBC: 9.2 10*3/uL (ref 4.0–10.5)
nRBC: 0 % (ref 0.0–0.2)

## 2024-03-08 LAB — HEPATITIS B SURFACE ANTIGEN: Hepatitis B Surface Ag: NONREACTIVE

## 2024-03-08 LAB — GLUCOSE, CAPILLARY
Glucose-Capillary: 109 mg/dL — ABNORMAL HIGH (ref 70–99)
Glucose-Capillary: 103 mg/dL — ABNORMAL HIGH (ref 70–99)
Glucose-Capillary: 100 mg/dL — ABNORMAL HIGH (ref 70–99)

## 2024-03-08 MED ORDER — LIDOCAINE HCL (PF) 1 % IJ SOLN: 5.0000 mL | INTRAMUSCULAR | Status: DC | PRN

## 2024-03-08 MED ORDER — HYDROMORPHONE HCL 1 MG/ML IJ SOLN
0.5000 mg | Freq: Once | INTRAMUSCULAR | Status: AC | PRN
  Administered 2024-03-08: 0.5 mg via INTRAVENOUS
  Filled 2024-03-08: qty 0.5

## 2024-03-08 MED ORDER — LIDOCAINE-PRILOCAINE 2.5-2.5 % EX CREA: 1.0000 | TOPICAL_CREAM | CUTANEOUS | Status: DC | PRN

## 2024-03-08 MED ORDER — HYDROMORPHONE HCL 1 MG/ML IJ SOLN
INTRAMUSCULAR | Status: AC
  Filled 2024-03-08: qty 0.5

## 2024-03-08 MED ORDER — HEPARIN SODIUM (PORCINE) 1000 UNIT/ML IJ SOLN
INTRAMUSCULAR | Status: AC
  Filled 2024-03-08: qty 4

## 2024-03-08 MED ORDER — ANTICOAGULANT SODIUM CITRATE 4% (200MG/5ML) IV SOLN: 5.0000 mL | Status: DC | PRN

## 2024-03-08 MED ORDER — PENTAFLUOROPROP-TETRAFLUOROETH EX AERO: 1.0000 | INHALATION_SPRAY | CUTANEOUS | Status: DC | PRN

## 2024-03-08 MED ORDER — VANCOMYCIN HCL 750 MG/150ML IV SOLN
INTRAVENOUS | Status: AC
Start: 2024-03-08 — End: ?
  Filled 2024-03-08: qty 150

## 2024-03-08 MED ORDER — NEPRO/CARBSTEADY PO LIQD
237.0000 mL | ORAL | Status: DC | PRN
Start: 2024-03-08 — End: 2024-03-08

## 2024-03-08 MED ORDER — HEPARIN SODIUM (PORCINE) 1000 UNIT/ML IJ SOLN
3200.0000 [IU] | Freq: Once | INTRAMUSCULAR | Status: AC
  Administered 2024-03-08: 3200 [IU]

## 2024-03-08 MED ORDER — ALBUMIN HUMAN 25 % IV SOLN
INTRAVENOUS | Status: AC
  Filled 2024-03-08: qty 100

## 2024-03-08 MED ORDER — HEPARIN SODIUM (PORCINE) 1000 UNIT/ML DIALYSIS: 1000.0000 [IU] | INTRAMUSCULAR | Status: DC | PRN

## 2024-03-08 MED ORDER — ALTEPLASE 2 MG IJ SOLR: 2.0000 mg | Freq: Once | INTRAMUSCULAR | Status: DC | PRN

## 2024-03-08 MED ORDER — HYDROMORPHONE HCL 1 MG/ML IJ SOLN
0.5000 mg | Freq: Once | INTRAMUSCULAR | Status: AC
  Administered 2024-03-08: 0.5 mg via INTRAVENOUS

## 2024-03-08 NOTE — Progress Notes (Signed)
 Dialysis chair brought back up to [facility].

## 2024-03-08 NOTE — Progress Notes (Signed)
 PROGRESS NOTE        PATIENT DETAILS Name: Thomas [name] Age: 73 y.o. Sex: male Date of Birth: [birth year] Admit Date: 02/07/2024 Admitting Physician Roxana Copier, DO [name], [name], MD  Brief Summary:  Patient is a 73 y.o.  male who with history of ESRD on HD TTS, history of posterior fusion at T10-12 due to osteomyelitis/discitis/epidural abscess in 2021, history of right BKA who recently completed 6 weeks of daptomycin /cefepime  on 3/28 for left foot infection with osteomyelitis-presented with low back pain-upon further evaluation with CT imaging-he was found to have acute osteomyelitis involving his thoracic spine and admitted to the hospitalist service.  Significant events: 4/20>> admit to TRH-back pain-concern for thoracic osteomyelitis.  Significant studies: 4/21>> x-ray right elbow: No fracture. 4/21>> x-ray left wrist: No fracture 4/21>> x-ray left shoulder, no fracture. 4/21>> CT head: No acute intracranial abnormality 4/21>> CT C-spine: No fracture. 4/21>> CT abdomen/pelvis: No acute injury to chest/abdomen/pelvis 4/21>> CT T-spine: Prior posterior fusion from T10-T12-abnormal appearance with collapse of T10-lucency around T10 screws at the T9-10 disc space, T10-11 disc space concerning for discitis/osteomyelitis. 4/21>> CT L-spine: No acute bony abnormality, mild chronic compression fracture at L1. 4/22>> x-ray left foot: Chronic erosive changes about the forefoot similar to 11/21/2023-osteomyelitis/septic arthritis of the 1st/2nd MTP joints difficult to exclude. 4/25>> left ABI: Within normal limit.  Significant microbiology data: 4/22>> blood culture: No growth  Procedures: 4/30>> left transmetatarsal amputation by Dr. Julio Ohm.  Consults: Neurosurgery Infectious disease Ortho  Subjective:    No significant events overnight, he denies any complaints today, patient went for his hemodialysis, sitting in a  chair.  Objective: Vitals: Blood pressure 102/75, pulse 78, temperature 97.6 F (36.4 C), temperature source Oral, resp. rate 19, height 6\' 5"  (1.956 m), weight 85.1 kg, SpO2 100%.   Exam:  Alert, awake ,no apparent distress Extremities: Left TMA dressing in place, s/p chronic right AKA.   Assessment/Plan:  Acute osteomyelitis with discitis involving T9 to T11 vertebra Per neurosurgery not an operative candidate Per IR-no window to aspirate Plan is for 6 weeks of vancomycin /cefepime  with hemodialysis-EOT 03/23/24 Blood cultures negative so far Back pain better on long and short acting Oxy codon, stop IV Dilaudid , he is in no distress and always found sleeping comfortably Continue to wear TLSO brace when out of the bed. Note patient is on high doses of narcotics chronically, exhibits some narcotic seeking behavior, counseled to avoid excessive use to avoid accidental overdose, as needed Narcan  on board.  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 Chronic left second toe ulceration He just completed 6 weeks of daptomycin /cefepime  on 3/28-has refused amputation in the past.  Concern is that he may have seeded his spine through foot infection.  Subsequently evaluated by Dr. Julio Ohm on 4/28-underwent TMA on 4/30.  Toe-touch weightbearing on that leg.  Transient hypotension on 4/30 Now stable on low-dose Norvasc , clonidine /losartan /metoprolol  remains on hold-continue to monitor and add as appropriate.  Constipation Chronic issue-secondary to narcotics Finally had a BM after several days-remains on MiraLAX /senna-Movantik  added on 5/2. Dulcolax suppository as needed.  Mechanical fall Numerous imaging studies negative for fracture PT/OT eval  ESRD on HD TTS Nephrology following and directing HD care  Hyperkalemia on 02/20/2024 He underwent HD 02/20/2024 afternoon, 2 doses of Lokelma  on 02/22/2024, defer to nephrology  Prolonged QTc Twelve-lead EKG on 4/30 with normal  QTc Telemetry monitoring  Normocytic anemia Secondary to ESRD Aranesp /iron  deferred to nephrology service.  Chronic HFpEF Euvolemic on exam Fluid removal with HD  HTN See above regarding hypotension BP relatively stable-see above discussion.  HLD Statin  Hypothyroidism Synthroid   BPH Flomax /bethanechol   History of right BKA  Debility/deconditioning For SNF placement, will need clear process as will need to be placed in [place], currently clipped in [place], tolerating dialysis sitting up,   BMI: Estimated body mass index is 22.25 kg/m as calculated from the following:   Height as of this encounter: 6\' 5"  (1.956 m).   Weight as of this encounter: 85.1 kg.   Code status:   Code Status: Full Code   DVT Prophylaxis: SCD's Start: 02/17/24 1538 heparin  injection 5,000 Units Start: 02/10/24 1400 SCDs Start: 02/08/24 2206   Family Communication: Discussed with the patient, none at bedside today   Disposition Plan: Status is: Inpatient He is medically stable for discharge when SNF bed available and [facility] has been obtained   Planned Discharge Destination: SNF   Diet: Diet Order             Diet regular Fluid consistency: Thin  Diet effective now                    MEDICATIONS: Scheduled Meds:  vitamin C   1,000 mg Oral Daily   atorvastatin   80 mg Oral Daily   bethanechol   10 mg Oral BID   Chlorhexidine  Gluconate Cloth  6 each Topical Q0600   Chlorhexidine  Gluconate Cloth  6 each Topical Q0600   darbepoetin (ARANESP ) injection - DIALYSIS  100 mcg Subcutaneous Q Sat-1800   heparin  injection (subcutaneous)  5,000 Units Subcutaneous Q8H   lactulose   30 g Oral Daily   levothyroxine   50 mcg Oral QAC breakfast   midodrine   10 mg Oral BID WC   naloxegol  oxalate  12.5 mg Oral Daily   nutrition supplement (JUVEN)  1 packet Oral BID BM   oxyCODONE   30 mg Oral Q8H   polyethylene glycol  17 g Oral BID   senna-docusate  4 tablet Oral BID   sevelamer   carbonate  1,600 mg Oral TID WC   tamsulosin   0.4 mg Oral Daily   Continuous Infusions:  sodium chloride  10 mL/hr at 02/29/24 1700   anticoagulant sodium citrate      ceFEPime  (MAXIPIME ) IV 2 g (03/05/24 1754)   promethazine  (PHENERGAN ) injection (IM or IVPB) 12.5 mg (03/07/24 1753)   vancomycin  750 mg (03/08/24 1308)   PRN Meds:.acetaminophen  **OR** acetaminophen , alteplase , anticoagulant sodium citrate , bisacodyl , feeding supplement (NEPRO CARB STEADY), heparin , lidocaine  (PF), lidocaine -prilocaine , melatonin, naLOXone  (NARCAN )  injection, ondansetron  (ZOFRAN ) IV, oxyCODONE , pentafluoroprop-tetrafluoroeth, promethazine  (PHENERGAN ) injection (IM or IVPB), tiZANidine    I have personally reviewed following labs and imaging studies  LABORATORY DATA: CBC: Recent Labs  Lab 03/05/24 0909 03/08/24 0436  WBC 8.4 9.2  HGB 10.1* 11.0*  HCT 31.1* 33.9*  MCV 106.5* 107.6*  PLT 166 169    Basic Metabolic Panel: Recent Labs  Lab 03/05/24 0909 03/08/24 0436  NA 134* 138  K 3.9 4.8  CL 92* 98  CO2 22 20*  GLUCOSE 116* 100*  BUN 54* 64*  CREATININE 9.48* 9.95*  CALCIUM  9.2 8.6*  PHOS 7.4* 7.8*    GFR:  Estimated Creatinine Clearance: 8.3 mL/min (A) (by C-G formula based on SCr of 9.95 mg/dL (H)).  Liver Function Tests: Recent Labs  Lab 03/05/24 0909 03/08/24 0436  ALBUMIN  3.4* 3.5  Urine analysis:     Component Value Date/Time   COLORURINE YELLOW 01/14/2022 0248   APPEARANCEUR HAZY (A) 01/14/2022 0248   LABSPEC 1.009 01/14/2022 0248   PHURINE 7.0 01/14/2022 0248   GLUCOSEU NEGATIVE 01/14/2022 0248   HGBUR MODERATE (A) 01/14/2022 0248   BILIRUBINUR NEGATIVE 01/14/2022 0248   KETONESUR NEGATIVE 01/14/2022 0248   PROTEINUR 100 (A) 01/14/2022 0248   NITRITE NEGATIVE 01/14/2022 0248   LEUKOCYTESUR LARGE (A) 01/14/2022 0248    Sepsis Labs:  Lactic Acid, Venous    Component Value Date/Time   LATICACIDVEN 1.0 12/31/2021 0048    LOS: 29 days   Signature  -     Seena Dadds M.D on 03/08/2024 at 2:00 PM   -  To page go to [email or web address]

## 2024-03-08 NOTE — Plan of Care (Signed)
 Pt has rested quietly throughout the night with no distress noted. Alert and oriented. ON room air. No tele monitor. Oliguric/voids per urinal. Medicated once for spasms with relief noted and medicated once with prn pain med with relief noted. No other complaints voiced.     Problem: Tissue Perfusion: Goal: Adequacy of tissue perfusion will improve Outcome: Progressing   Problem: Education: Goal: Knowledge of General Education information will improve Description: Including pain rating scale, medication(s)/side effects and non-pharmacologic comfort measures Outcome: Progressing   Problem: Clinical Measurements: Goal: Respiratory complications will improve Outcome: Progressing Goal: Cardiovascular complication will be avoided Outcome: Progressing   Problem: Pain Managment: Goal: General experience of comfort will improve and/or be controlled Outcome: Progressing

## 2024-03-08 NOTE — Progress Notes (Signed)
 Per Dr. Lorena Rolling:  Patient now agreeable to sitting in chair for Dialysis.  Luciano Ruths, LPN-KDU

## 2024-03-08 NOTE — TOC Progression Note (Signed)
 Transition of Care [facility]) - Progression Note    Patient Details  Name: AVION KUTZER MRN: [id number] Date of Birth: [birth year]  Transition of Care [facility]) CM/SW Contact  Jannice Mends, LCSW Phone Number: 03/08/2024, 9:54 AM  Clinical Narrative:    9:54 AM-Insurance status still pending for [facility].    Expected Discharge Plan: Skilled Nursing Facility Barriers to Discharge: Continued Medical Work up, English as a second language teacher (OP HD chair)  Expected Discharge Plan and Services     Post Acute Care Choice: Skilled Nursing Facility Living arrangements for the past 2 months: Single Family Home                                       Social Determinants of Health (SDOH) Interventions SDOH Screenings   Food Insecurity: No Food Insecurity (02/09/2024)  Housing: Low Risk  (02/09/2024)  Transportation Needs: No Transportation Needs (02/09/2024)  Utilities: Not At Risk (02/09/2024)  Depression (PHQ2-9): Low Risk  (02/08/2020)  Social Connections: Socially Isolated (02/11/2024)  Tobacco Use: Low Risk  (02/07/2024)    Readmission Risk Interventions    01/15/2022    2:56 PM 01/02/2022    2:03 PM 12/31/2021   10:32 AM  Readmission Risk Prevention Plan  Transportation Screening Complete Complete Complete  Medication Review Oceanographer) Complete Complete Complete  PCP or Specialist appointment within 3-5 days of discharge Complete Complete Complete  HRI or Home Care Consult Complete Complete Complete  SW Recovery Care/Counseling Consult Complete Complete Complete  Palliative Care Screening Not Applicable Not Applicable Not Applicable  Skilled Nursing Facility Not Applicable Not Applicable Not Applicable

## 2024-03-08 NOTE — Procedures (Signed)
 Seen and examined on dialysis.  Blood pressure 108/84 and HR 66.  Tolerating goal.  Procedure supervised.  RIJ tunn catheter in use.    Nan Aver, MD 03/08/2024 10:38 AM

## 2024-03-08 NOTE — Progress Notes (Signed)
 Per floor nurse:  patient is refusing to sit in chair for dialysis.  Luciano Ruths, LPN-KDU

## 2024-03-08 NOTE — Progress Notes (Addendum)
 Received patient in bed to unit.  Alert and oriented.  Informed consent signed and in chart.   TX duration: 3 hours and 18 minutes  Patient had a cramping attack in back and forarms and said he felt sick.  Terminated session for patient. Dr. Yvonnie Heritage informed on floor. Transported back to the room  Alert, without acute distress.  Hand-off given to patient's nurse.   Access used: R HD internal jugular Cath Access issues: none  Total UF removed: Medication(s) given: 400 cc Normal Saline bolus to help cramping, dilaudid , Vancomycin    03/08/24 1407  Vitals  Temp 98.1 F (36.7 C)  Temp Source Oral  BP (!) 154/82  Pulse Rate 80  ECG Heart Rate 80  Resp 18  MEWS COLOR  MEWS Score Color Green  Oxygen Therapy  SpO2 100 %  O2 Device Nasal Cannula  O2 Flow Rate (L/min) 2 L/min  MEWS Score  MEWS Temp 0  MEWS Systolic 0  MEWS Pulse 0  MEWS RR 0  MEWS LOC 0  MEWS Score 0     Luciano Ruths LPN [facility]

## 2024-03-08 NOTE — Progress Notes (Addendum)
  [facility] Progress Note   Subjective:   Patient seen on HD today. He reports that he is in a lot of pain due to the discomfort of the chair. No reported dyspnea or chest pain. Next HD 03/10/24  Patient has tentative plans to d/c to [facility] and will get HD at [facility] on MWF to continue his HD while he is in SNF.   Objective Vitals:   03/08/24 1245 03/08/24 1300 03/08/24 1330 03/08/24 1400  BP: 98/73 119/88 102/75 (!) 111/98  Pulse: 74 77 78 (!) 142  Resp: 12 12 19 19   Temp:      TempSrc:      SpO2: 100% 100% 100% (!) 68%  Weight:      Height:       Physical Exam General: Alert male in NAD Heart: RRR, no murmurs, rubs or gallops  Lungs:CTA bilaterally, respirations unlabored  Abdomen: Soft, non-distended Extremities: R BKA, L TMA, no edema appreciated Dialysis Access:  [facility]  Additional Objective Labs: Basic Metabolic Panel: Recent Labs  Lab 03/05/24 0909 03/08/24 0436  NA 134* 138  K 3.9 4.8  CL 92* 98  CO2 22 20*  GLUCOSE 116* 100*  BUN 54* 64*  CREATININE 9.48* 9.95*  CALCIUM  9.2 8.6*  PHOS 7.4* 7.8*   Liver Function Tests: Recent Labs  Lab 03/05/24 0909 03/08/24 0436  ALBUMIN  3.4* 3.5   No results for input(s): "LIPASE", "AMYLASE" in the last 168 hours. CBC: Recent Labs  Lab 03/05/24 0909 03/08/24 0436  WBC 8.4 9.2  HGB 10.1* 11.0*  HCT 31.1* 33.9*  MCV 106.5* 107.6*  PLT 166 169   Blood Culture    Component Value Date/Time   SDES BLOOD BLOOD RIGHT HAND AEROBIC BOTTLE ONLY 02/09/2024 0414   SPECREQUEST  02/09/2024 0414    BOTTLES DRAWN AEROBIC ONLY Blood Culture adequate volume   CULT  02/09/2024 0414    NO GROWTH 5 DAYS Performed at [facility], [address] [place], [place] [address]    REPTSTATUS 02/14/2024 FINAL 02/09/2024 0414    Cardiac Enzymes: No results for input(s): "CKTOTAL", "CKMB", "CKMBINDEX", "TROPONINI" in the last 168 hours. CBG: Recent Labs  Lab 03/07/24 0808 03/07/24 1152 03/07/24 1659  03/07/24 2112 03/08/24 0832  GLUCAP 86 173* 98 234* 109*   Iron  Studies: No results for input(s): "IRON ", "TIBC", "TRANSFERRIN", "FERRITIN" in the last 72 hours. @lablastinr3 @ Studies/Results: No results found.  Medications:  sodium chloride  10 mL/hr at 02/29/24 1700   anticoagulant sodium citrate      ceFEPime  (MAXIPIME ) IV 2 g (03/05/24 1754)   promethazine  (PHENERGAN ) injection (IM or IVPB) 12.5 mg (03/07/24 1753)   vancomycin  750 mg (03/08/24 1308)    vitamin C   1,000 mg Oral Daily   atorvastatin   80 mg Oral Daily   bethanechol   10 mg Oral BID   Chlorhexidine  Gluconate Cloth  6 each Topical Q0600   Chlorhexidine  Gluconate Cloth  6 each Topical Q0600   darbepoetin (ARANESP ) injection - DIALYSIS  100 mcg Subcutaneous Q Sat-1800   heparin  injection (subcutaneous)  5,000 Units Subcutaneous Q8H   lactulose   30 g Oral Daily   levothyroxine   50 mcg Oral QAC breakfast   midodrine   10 mg Oral BID WC   naloxegol  oxalate  12.5 mg Oral Daily   nutrition supplement (JUVEN)  1 packet Oral BID BM   oxyCODONE   30 mg Oral Q8H   polyethylene glycol  17 g Oral BID   senna-docusate  4 tablet Oral BID  sevelamer  carbonate  1,600 mg Oral TID WC   tamsulosin   0.4 mg Oral Daily    Dialysis Orders: TTS [place] 4h  B400  92kg  TDC  Heparin  none - Mircera 225mcg IV q 2 weeks (last 4/9) - Calcitriol  0.41mcg PO q HD  Assessment/Plan: 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 CT imaging showed T9-10 discitis. He refused MRI, area was too small for aspiration per IR. H/o of foot infection as below. Concerned that this infection may have seeded the spine. Blood Cxs were negative. Not an operative candidate per neurosurgery.  Per ID continue IV Vanc 750mg  and IV Cefepime  2gm with HD x 6 weeks thru 03/23/24 for the discitis.  Left foot osteomyelitis - completed an 8 week course of Dapto/Cefepime  on 3/28 for bacteremia presumed due to foot infection. S/p left TMA on 4/30. Antibiotics as  above.  Hyperkalemia - continue renal diet. Improved. K 4.8 pre HD.  ESRD:  Usual TTS schedule. Next HD 03/10/24 HTN/volume: BPs okay, below prior EDW.  Has some residual UOP, euvolemic on exam. Will need updated post HD weight to determine EDW for OP HD.  Anemia: HGB 10.1. Continue weekly aranesp . No venofer in setting of infection. Metabolic bone disease: Ca ok, Phos high - continue home binders. 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 Dispo - For SNF placement   Hersey Lorenzo, NP-C 03/08/2024, 2:09 PM  [facility]  Seen and examined independently.  Agree with note and exam as documented above by physician extender and as noted here.  See my procedure note from today  Tolerated HD in a chair.  Would reduce UF with next HD - cramping and hypotension near end of tx per nursing   Nan Aver, MD 03/08/2024  5:57 PM

## 2024-03-08 NOTE — Progress Notes (Signed)
 Provided update to [facility] admissions and [facility] G[facility] that insurance auth for snf is still pending at this time. Will provide updates as info is received. Will assist as needed.   Lauraine Polite Renal Navigator [phone]

## 2024-03-08 NOTE — Progress Notes (Signed)
   03/08/24 1407  Vitals  Temp 98.1 F (36.7 C)  Temp Source Oral  BP (!) 154/82  Pulse Rate 80  ECG Heart Rate 80  Resp 18  Oxygen Therapy  SpO2 100 %  O2 Device Nasal Cannula  O2 Flow Rate (L/min) 2 L/min  During Treatment Monitoring  Duration of HD Treatment -hour(s) 3.29 hour(s) (3 hours and 18 minutes)  HD Safety Checks Performed Yes  Intra-Hemodialysis Comments  (Terminated session due to major cramping and patient feeling sick)  Dialysis Fluid Bolus Normal Saline  Bolus Amount (mL) 400 mL  Post Treatment  Dialyzer Clearance Heavily streaked  Liters Processed 79  Fluid Removed (mL) 100 mL  Tolerated HD Treatment Yes  Post-Hemodialysis Comments Patient cramped with 10 minutes left, terminated treatment  Hemodialysis Catheter Right Internal jugular  Placement Date/Time: 01/02/24 1459   Placed prior to admission: Yes  Orientation: Right  Access Location: Internal jugular  Site Condition No complications  Blue Lumen Status Flushed;Heparin  locked;Dead end cap in place  Red Lumen Status Flushed;Heparin  locked;Dead end cap in place

## 2024-03-09 DIAGNOSIS — M4624 Osteomyelitis of vertebra, thoracic region: Secondary | ICD-10-CM | POA: Diagnosis not present

## 2024-03-09 LAB — BASIC METABOLIC PANEL WITH GFR
Anion gap: 16 — ABNORMAL HIGH (ref 5–15)
BUN: 41 mg/dL — ABNORMAL HIGH (ref 8–23)
CO2: 25 mmol/L (ref 22–32)
Calcium: 8.8 mg/dL — ABNORMAL LOW (ref 8.9–10.3)
Chloride: 98 mmol/L (ref 98–111)
Creatinine, Ser: 8.07 mg/dL — ABNORMAL HIGH (ref 0.61–1.24)
GFR, Estimated: 7 mL/min — ABNORMAL LOW (ref >60)
Glucose, Bld: 95 mg/dL (ref 70–99)
Potassium: 4.4 mmol/L (ref 3.5–5.1)
Sodium: 139 mmol/L (ref 135–145)

## 2024-03-09 LAB — GLUCOSE, CAPILLARY
Glucose-Capillary: 97 mg/dL (ref 70–99)
Glucose-Capillary: 94 mg/dL (ref 70–99)
Glucose-Capillary: 120 mg/dL — ABNORMAL HIGH (ref 70–99)
Glucose-Capillary: 104 mg/dL — ABNORMAL HIGH (ref 70–99)

## 2024-03-09 LAB — CBC WITH DIFFERENTIAL/PLATELET
Abs Immature Granulocytes: 0.02 10*3/uL (ref 0.00–0.07)
Basophils Absolute: 0.1 10*3/uL (ref 0.0–0.1)
Basophils Relative: 1 %
Eosinophils Absolute: 0.4 10*3/uL (ref 0.0–0.5)
Eosinophils Relative: 7 %
HCT: 31.7 % — ABNORMAL LOW (ref 39.0–52.0)
Hemoglobin: 9.8 g/dL — ABNORMAL LOW (ref 13.0–17.0)
Immature Granulocytes: 0 %
Lymphocytes Relative: 27 %
Lymphs Abs: 1.4 10*3/uL (ref 0.7–4.0)
MCH: 34.1 pg — ABNORMAL HIGH (ref 26.0–34.0)
MCHC: 30.9 g/dL (ref 30.0–36.0)
MCV: 110.5 fL — ABNORMAL HIGH (ref 80.0–100.0)
Monocytes Absolute: 0.4 10*3/uL (ref 0.1–1.0)
Monocytes Relative: 8 %
Neutro Abs: 2.8 10*3/uL (ref 1.7–7.7)
Neutrophils Relative %: 57 %
Platelets: 137 10*3/uL — ABNORMAL LOW (ref 150–400)
RBC: 2.87 MIL/uL — ABNORMAL LOW (ref 4.22–5.81)
RDW: 15.5 % (ref 11.5–15.5)
WBC: 5 10*3/uL (ref 4.0–10.5)
nRBC: 0 % (ref 0.0–0.2)

## 2024-03-09 LAB — HEPATITIS B SURFACE ANTIBODY, QUANTITATIVE: Hep B S AB Quant (Post): 7.4 m[IU]/mL — ABNORMAL LOW

## 2024-03-09 MED ORDER — CHLORHEXIDINE GLUCONATE CLOTH 2 % EX PADS
6.0000 | MEDICATED_PAD | Freq: Every day | CUTANEOUS | Status: DC
  Administered 2024-03-10 – 2024-03-11 (×2): 6 via TOPICAL

## 2024-03-09 MED ORDER — HEPARIN SODIUM (PORCINE) 1000 UNIT/ML IJ SOLN
INTRAMUSCULAR | Status: AC
  Filled 2024-03-09: qty 4

## 2024-03-09 MED ORDER — HYDROMORPHONE HCL 1 MG/ML IJ SOLN
INTRAMUSCULAR | Status: AC
  Filled 2024-03-09: qty 1

## 2024-03-09 MED ORDER — HYDROMORPHONE HCL 1 MG/ML IJ SOLN
1.0000 mg | Freq: Once | INTRAMUSCULAR | Status: AC | PRN
  Administered 2024-03-09: 1 mg via INTRAVENOUS

## 2024-03-09 MED ORDER — FENTANYL CITRATE PF 50 MCG/ML IJ SOSY
12.5000 ug | PREFILLED_SYRINGE | Freq: Once | INTRAMUSCULAR | Status: AC
  Administered 2024-03-09: 12.5 ug via INTRAVENOUS
  Filled 2024-03-09: qty 1

## 2024-03-09 MED ORDER — HEPARIN SODIUM (PORCINE) 1000 UNIT/ML IJ SOLN
4000.0000 [IU] | Freq: Once | INTRAMUSCULAR | Status: AC
Start: 2024-03-09 — End: 2024-03-09
  Administered 2024-03-09: 4000 [IU]

## 2024-03-09 NOTE — Progress Notes (Addendum)
 [facility] Progress Note   Subjective:   Patient seen in his room today. He reported that he has a lot of pain due to the discomfort of the chair while doing HD. No reported dyspnea or chest pain. K is stable and his DBP is higher today. Using [facility]. He had completed his lunch when I saw him, and I noticed that his binders were still in medicine cup. Discussed taking them before his meals to lower his PO4. Only complaint is chronic constipation that requires suppository. Next HD 03/10/24  Patient has tentative plans to d/c to [facility] and will get HD at [facility] on MWF to continue his HD while he is in SNF. Still awaiting d/c dispo  Objective Vitals:   03/09/24 0420 03/09/24 0457 03/09/24 0744 03/09/24 1210  BP: 115/77  (!) 117/106 (!) 142/101  Pulse:   (!) 111   Resp: 18  14   Temp: 98.1 F (36.7 C)  98.6 F (37 C) 99 F (37.2 C)  TempSrc: Axillary  Oral Oral  SpO2: 100%     Weight:  87.2 kg    Height:       Physical Exam General: Alert male in NAD Heart: RRR, no murmurs, rubs or gallops  Lungs:CTA bilaterally, respirations unlabored  Abdomen: Soft, non-distended Extremities: R BKA, L TMA, no edema appreciated Dialysis Access:  [facility]  Additional Objective Labs: Basic Metabolic Panel: Recent Labs  Lab 03/05/24 0909 03/08/24 0436  NA 134* 138  K 3.9 4.8  CL 92* 98  CO2 22 20*  GLUCOSE 116* 100*  BUN 54* 64*  CREATININE 9.48* 9.95*  CALCIUM  9.2 8.6*  PHOS 7.4* 7.8*   Liver Function Tests: Recent Labs  Lab 03/05/24 0909 03/08/24 0436  ALBUMIN  3.4* 3.5   No results for input(s): "LIPASE", "AMYLASE" in the last 168 hours. CBC: Recent Labs  Lab 03/05/24 0909 03/08/24 0436  WBC 8.4 9.2  HGB 10.1* 11.0*  HCT 31.1* 33.9*  MCV 106.5* 107.6*  PLT 166 169   Blood Culture    Component Value Date/Time   SDES BLOOD BLOOD RIGHT HAND AEROBIC BOTTLE ONLY 02/09/2024 0414   SPECREQUEST  02/09/2024 0414    BOTTLES DRAWN AEROBIC ONLY Blood Culture adequate  volume   CULT  02/09/2024 0414    NO GROWTH 5 DAYS Performed at [facility], [address], [place], [place] [address]    REPTSTATUS 02/14/2024 FINAL 02/09/2024 0414    Cardiac Enzymes: No results for input(s): "CKTOTAL", "CKMB", "CKMBINDEX", "TROPONINI" in the last 168 hours. CBG: Recent Labs  Lab 03/08/24 0832 03/08/24 1535 03/08/24 2111 03/09/24 0744 03/09/24 1216  GLUCAP 109* 103* 100* 97 94   Iron  Studies: No results for input(s): "IRON ", "TIBC", "TRANSFERRIN", "FERRITIN" in the last 72 hours. @lablastinr3 @ Studies/Results: No results found.  Medications:  sodium chloride  10 mL/hr at 02/29/24 1700   ceFEPime  (MAXIPIME ) IV Stopped (03/08/24 1958)   promethazine  (PHENERGAN ) injection (IM or IVPB) 12.5 mg (03/07/24 1753)   vancomycin  750 mg (03/08/24 1308)    vitamin C   1,000 mg Oral Daily   atorvastatin   80 mg Oral Daily   bethanechol   10 mg Oral BID   Chlorhexidine  Gluconate Cloth  6 each Topical Q0600   Chlorhexidine  Gluconate Cloth  6 each Topical Q0600   darbepoetin (ARANESP ) injection - DIALYSIS  100 mcg Subcutaneous Q Sat-1800   heparin  injection (subcutaneous)  5,000 Units Subcutaneous Q8H   lactulose   30 g Oral Daily   levothyroxine   50 mcg  Oral QAC breakfast   midodrine   10 mg Oral BID WC   naloxegol  oxalate  12.5 mg Oral Daily   nutrition supplement (JUVEN)  1 packet Oral BID BM   oxyCODONE   30 mg Oral Q8H   polyethylene glycol  17 g Oral BID   senna-docusate  4 tablet Oral BID   sevelamer  carbonate  1,600 mg Oral TID WC   tamsulosin   0.4 mg Oral Daily    Dialysis Orders: TTS [place] 4h  B400  92kg  TDC  Heparin  none - Mircera 225mcg IV q 2 weeks (last 4/9) - Calcitriol  0.34mcg PO q HD  Assessment/Plan: 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 CT imaging showed T9-10 discitis. He refused MRI, area was too small for aspiration per IR. H/o of foot infection as below. Concerned that this infection may have seeded the spine.  Blood Cxs were negative. Not an operative candidate per neurosurgery.  Per ID continue IV Vanc 750mg  and IV Cefepime  2gm with HD x 6 weeks thru 03/23/24 for the discitis.  Left foot osteomyelitis - completed an 8 week course of Dapto/Cefepime  on 3/28 for bacteremia presumed due to foot infection. S/p left TMA on 4/30. Antibiotics as above.  Hyperkalemia - continue renal diet. Improved. K 4.8 pre HD.  ESRD:  Usual TTS schedule. Next HD 03/10/24 HTN/volume: BPs okay, below prior EDW.  Has some residual UOP, euvolemic on exam. Will need updated post HD weight to determine EDW for OP HD.  Anemia: HGB 11.0. Continue weekly aranesp . No venofer in setting of infection. Metabolic bone disease: Ca ok, Phos high - continue home binders.  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 Dispo - For SNF placement   Thomas Lorenzo, NP-C 03/09/2024, 2:05 PM  [facility]    Seen and examined independently.  Agree with note and exam as documented above by physician extender and as noted here.  SW is talking with patient - he is still deciding as to what to do however SNF is highly recommended by team.  He agrees to do HD today.   General elderly male in bed in no acute distress HEENT normocephalic atraumatic extraocular movements intact sclera anicteric Neck supple trachea midline Lungs clear to auscultation bilaterally normal work of breathing at rest  Heart S1S2 no rub  Abdomen soft nontender nondistended Extremities trace edema  Psych normal mood and affect Neuro alert and conversant; follows commands and provides hx  Access RIJ tunn catheter in place  ESRD - HD today to get on MWF schedule   Thoracic osteo/discitis - abx as above. Per ID continue IV Vanc 750mg  and IV Cefepime  2gm with HD x 6 weeks thru 03/23/24   Anemia of CKD - on weekly ESA  Nan Aver, MD 03/09/2024  2:40 PM

## 2024-03-09 NOTE — Progress Notes (Addendum)
 PROGRESS NOTE        PATIENT DETAILS Name: Thomas [name] Age: 73 y.o. Sex: male Date of Birth: Jul 11, 1951 Admit Date: 02/07/2024 Admitting Physician Roxana Copier, DO ZOX:WRUEAV, [name], MD  Brief Summary:  Patient is a 73 y.o.  male who with history of ESRD on HD TTS, history of posterior fusion at T10-12 due to osteomyelitis/discitis/epidural abscess in 2021, history of right BKA who recently completed 6 weeks of daptomycin /cefepime  on 3/28 for left foot infection with osteomyelitis-presented with low back pain-upon further evaluation with CT imaging-he was found to have acute osteomyelitis involving his thoracic spine and admitted to the hospitalist service.  Significant events: 4/20>> admit to TRH-back pain-concern for thoracic osteomyelitis.  Significant studies: 4/21>> x-ray right elbow: No fracture. 4/21>> x-ray left wrist: No fracture 4/21>> x-ray left shoulder, no fracture. 4/21>> CT head: No acute intracranial abnormality 4/21>> CT C-spine: No fracture. 4/21>> CT abdomen/pelvis: No acute injury to chest/abdomen/pelvis 4/21>> CT T-spine: Prior posterior fusion from T10-T12-abnormal appearance with collapse of T10-lucency around T10 screws at the T9-10 disc space, T10-11 disc space concerning for discitis/osteomyelitis. 4/21>> CT L-spine: No acute bony abnormality, mild chronic compression fracture at L1. 4/22>> x-ray left foot: Chronic erosive changes about the forefoot similar to 11/21/2023-osteomyelitis/septic arthritis of the 1st/2nd MTP joints difficult to exclude. 4/25>> left ABI: Within normal limit.  Significant microbiology data: 4/22>> blood culture: No growth  Procedures: 4/30>> left transmetatarsal amputation by Dr. Julio Ohm.  Consults: Neurosurgery Infectious disease Ortho  Subjective:   Awake/alert-complains of cramps with hemodialysis.  No other complaints.  Objective: Vitals: Blood pressure (!) 117/106, pulse (!) 111,  temperature 98.6 F (37 C), temperature source Oral, resp. rate 14, height 6\' 5"  (1.956 m), weight 87.2 kg, SpO2 100%.   Exam: Awake/alert Not in any distress Left TMA dressing in place S/p right AKA  Assessment/Plan: Acute osteomyelitis with discitis involving T9 to T11 vertebra Per neurosurgery not an operative candidate Per IR-no window to aspirate Plan is for 6 weeks of vancomycin /cefepime  with hemodialysis-EOT 03/23/24 Blood cultures negative so far Back pain better on long and short acting Oxy codon-no longer on IV Dilaudid . Continue to wear TLSO brace when out of the bed. Note patient is on high doses of narcotics chronically, exhibits some narcotic seeking behavior, counseled to avoid excessive use to avoid accidental overdose, as needed Narcan  on board.  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 Chronic left second toe ulceration He just completed 6 weeks of daptomycin /cefepime  on 3/28-has refused amputation in the past.  Concern is that he may have seeded his spine through foot infection.  Subsequently evaluated by Dr. Julio Ohm on 4/28-underwent TMA on 4/30.  Toe-touch weightbearing on that leg.  Transient hypotension on 4/30 Now stable on low-dose Norvasc , clonidine /losartan /metoprolol  remains on hold-continue to monitor and add as appropriate.  Constipation Chronic issue-secondary to narcotics Finally had a BM after several days-remains on MiraLAX /senna-Movantik  added on 5/2. Dulcolax suppository as needed.  Mechanical fall Numerous imaging studies negative for fracture PT/OT eval  ESRD on HD TTS Nephrology following and directing HD care  Hyperkalemia on 02/20/2024 He underwent HD 02/20/2024 afternoon, 2 doses of Lokelma  on 02/22/2024, defer to nephrology  Prolonged QTc Twelve-lead EKG on 4/30 with normal QTc Telemetry monitoring  Normocytic anemia Secondary to ESRD Aranesp /iron  deferred to nephrology service.  Chronic HFpEF Euvolemic on exam Fluid  removal  with HD  HTN See above regarding hypotension BP relatively stable-see above discussion.  HLD Statin  Hypothyroidism Synthroid   BPH Flomax /bethanechol   History of right BKA  Debility/deconditioning For SNF placement, will need clear process as will need to be placed in [place], currently clipped in [place], tolerating dialysis sitting up,   BMI: Estimated body mass index is 22.8 kg/m as calculated from the following:   Height as of this encounter: 6\' 5"  (1.956 m).   Weight as of this encounter: 87.2 kg.   Code status:   Code Status: Full Code   DVT Prophylaxis: SCD's Start: 02/17/24 1538 heparin  injection 5,000 Units Start: 02/10/24 1400 SCDs Start: 02/08/24 2206   Family Communication: Discussed with the patient, none at bedside today   Disposition Plan: Status is: Inpatient He is medically stable for discharge when SNF bed available    Planned Discharge Destination: SNF   Diet: Diet Order             Diet regular Fluid consistency: Thin  Diet effective now                    MEDICATIONS: Scheduled Meds:  vitamin C   1,000 mg Oral Daily   atorvastatin   80 mg Oral Daily   bethanechol   10 mg Oral BID   Chlorhexidine  Gluconate Cloth  6 each Topical Q0600   Chlorhexidine  Gluconate Cloth  6 each Topical Q0600   darbepoetin (ARANESP ) injection - DIALYSIS  100 mcg Subcutaneous Q Sat-1800   heparin  injection (subcutaneous)  5,000 Units Subcutaneous Q8H   lactulose   30 g Oral Daily   levothyroxine   50 mcg Oral QAC breakfast   midodrine   10 mg Oral BID WC   naloxegol  oxalate  12.5 mg Oral Daily   nutrition supplement (JUVEN)  1 packet Oral BID BM   oxyCODONE   30 mg Oral Q8H   polyethylene glycol  17 g Oral BID   senna-docusate  4 tablet Oral BID   sevelamer  carbonate  1,600 mg Oral TID WC   tamsulosin   0.4 mg Oral Daily   Continuous Infusions:  sodium chloride  10 mL/hr at 02/29/24 1700   ceFEPime  (MAXIPIME ) IV Stopped (03/08/24 1958)    promethazine  (PHENERGAN ) injection (IM or IVPB) 12.5 mg (03/07/24 1753)   vancomycin  750 mg (03/08/24 1308)   PRN Meds:.acetaminophen  **OR** acetaminophen , bisacodyl , melatonin, naLOXone  (NARCAN )  injection, ondansetron  (ZOFRAN ) IV, oxyCODONE , promethazine  (PHENERGAN ) injection (IM or IVPB), tiZANidine    I have personally reviewed following labs and imaging studies  LABORATORY DATA: CBC: Recent Labs  Lab 03/05/24 0909 03/08/24 0436  WBC 8.4 9.2  HGB 10.1* 11.0*  HCT 31.1* 33.9*  MCV 106.5* 107.6*  PLT 166 169    Basic Metabolic Panel: Recent Labs  Lab 03/05/24 0909 03/08/24 0436  NA 134* 138  K 3.9 4.8  CL 92* 98  CO2 22 20*  GLUCOSE 116* 100*  BUN 54* 64*  CREATININE 9.48* 9.95*  CALCIUM  9.2 8.6*  PHOS 7.4* 7.8*    GFR:  Estimated Creatinine Clearance: 8.5 mL/min (A) (by C-G formula based on SCr of 9.95 mg/dL (H)).  Liver Function Tests: Recent Labs  Lab 03/05/24 0909 03/08/24 0436  ALBUMIN  3.4* 3.5    Urine analysis:     Component Value Date/Time   COLORURINE YELLOW 01/14/2022 0248   APPEARANCEUR HAZY (A) 01/14/2022 0248   LABSPEC 1.009 01/14/2022 0248   PHURINE 7.0 01/14/2022 0248   GLUCOSEU NEGATIVE 01/14/2022 0248   HGBUR MODERATE (A) 01/14/2022 0248  BILIRUBINUR NEGATIVE 01/14/2022 0248   KETONESUR NEGATIVE 01/14/2022 0248   PROTEINUR 100 (A) 01/14/2022 0248   NITRITE NEGATIVE 01/14/2022 0248   LEUKOCYTESUR LARGE (A) 01/14/2022 0248    Sepsis Labs:  Lactic Acid, Venous    Component Value Date/Time   LATICACIDVEN 1.0 12/31/2021 0048    LOS: 30 days   Signature  -    Kimberly Penna M.D on 03/09/2024 at 11:02 AM   -  To page go to [email or web address]

## 2024-03-09 NOTE — Progress Notes (Signed)
 Uf off due to irregular heart rate

## 2024-03-09 NOTE — Plan of Care (Signed)
  Problem: Education: Goal: Ability to describe self-care measures that may prevent or decrease complications (Diabetes Survival Skills Education) will improve Outcome: Progressing Goal: Individualized Educational Video(s) Outcome: Progressing   Problem: Coping: Goal: Ability to adjust to condition or change in health will improve Outcome: Progressing   Problem: Fluid Volume: Goal: Ability to maintain a balanced intake and output will improve Outcome: Progressing   Problem: Health Behavior/Discharge Planning: Goal: Ability to identify and utilize available resources and services will improve Outcome: Progressing Goal: Ability to manage health-related needs will improve Outcome: Progressing   Problem: Metabolic: Goal: Ability to maintain appropriate glucose levels will improve Outcome: Progressing   Problem: Nutritional: Goal: Maintenance of adequate nutrition will improve Outcome: Progressing Goal: Progress toward achieving an optimal weight will improve Outcome: Progressing   Problem: Skin Integrity: Goal: Risk for impaired skin integrity will decrease Outcome: Progressing   Problem: Tissue Perfusion: Goal: Adequacy of tissue perfusion will improve Outcome: Progressing   Problem: Education: Goal: Knowledge of General Education information will improve Description: Including pain rating scale, medication(s)/side effects and non-pharmacologic comfort measures Outcome: Progressing   Problem: Health Behavior/Discharge Planning: Goal: Ability to manage health-related needs will improve Outcome: Progressing   Problem: Clinical Measurements: Goal: Ability to maintain clinical measurements within normal limits will improve Outcome: Progressing Goal: Will remain free from infection Outcome: Progressing Goal: Diagnostic test results will improve Outcome: Progressing Goal: Respiratory complications will improve Outcome: Progressing Goal: Cardiovascular complication will  be avoided Outcome: Progressing   Problem: Activity: Goal: Risk for activity intolerance will decrease Outcome: Progressing   Problem: Nutrition: Goal: Adequate nutrition will be maintained Outcome: Progressing   Problem: Coping: Goal: Level of anxiety will decrease Outcome: Progressing   Problem: Elimination: Goal: Will not experience complications related to bowel motility Outcome: Progressing Goal: Will not experience complications related to urinary retention Outcome: Progressing   Problem: Pain Managment: Goal: General experience of comfort will improve and/or be controlled Outcome: Progressing   Problem: Safety: Goal: Ability to remain free from injury will improve Outcome: Progressing   Problem: Skin Integrity: Goal: Risk for impaired skin integrity will decrease Outcome: Progressing   Problem: Education: Goal: Knowledge of the prescribed therapeutic regimen will improve Outcome: Progressing Goal: Ability to verbalize activity precautions or restrictions will improve Outcome: Progressing Goal: Understanding of discharge needs will improve Outcome: Progressing   Problem: Activity: Goal: Ability to perform//tolerate increased activity and mobilize with assistive devices will improve Outcome: Progressing   Problem: Clinical Measurements: Goal: Postoperative complications will be avoided or minimized Outcome: Progressing   Problem: Self-Care: Goal: Ability to meet self-care needs will improve Outcome: Progressing   Problem: Self-Concept: Goal: Ability to maintain and perform role responsibilities to the fullest extent possible will improve Outcome: Progressing   Problem: Pain Management: Goal: Pain level will decrease with appropriate interventions Outcome: Progressing   Problem: Skin Integrity: Goal: Demonstration of wound healing without infection will improve Outcome: Progressing

## 2024-03-09 NOTE — TOC Progression Note (Addendum)
 Transition of Care [facility]) - Progression Note    Patient Details  Name: Thomas [name] MRN: [id number] Date of Birth: 03/29/1951  Transition of Care [facility] CM/SW Contact  Jannice Mends, LCSW Phone Number: 03/09/2024, 9:49 AM  Clinical Narrative:    9:49 AM-Insurance authorization still pending for [facility].   2:47 PM-Insurance approval received for [facility], Ref# [id number], effective 03/08/2024-03/10/2024 (5/23 til midnight). CSW consulted with Nephrology team who would like for patient to received two hours of Dialysis today and then resume OP HD Friday. Patient again asked about rating of [facility] which CSW went over with him. He stated he does not remember meeting with [facility]. He again stated he would rather return home. Nephrologist encouraged patient to go to rehab. Patient stated he did not want to leave without obtaining a new "prosthetic sleeve with a rod". CSW inquiring with therapy on how to order that.   Per therapy, it sounds like patient needs a new liner with the locking pin for his prosthetic, which would be custom from [employer] and likely taken care of outside the hospital. CSW will assist in that process if possible and request [facility] follow up.   Expected Discharge Plan: Skilled Nursing Facility Barriers to Discharge: Continued Medical Work up, English as a second language teacher (OP HD chair)  Expected Discharge Plan and Services     Post Acute Care Choice: Skilled Nursing Facility Living arrangements for the past 2 months: Single Family Home                                       Social Determinants of Health (SDOH) Interventions SDOH Screenings   Food Insecurity: No Food Insecurity (02/09/2024)  Housing: Low Risk  (02/09/2024)  Transportation Needs: No Transportation Needs (02/09/2024)  Utilities: Not At Risk (02/09/2024)  Depression (PHQ2-9): Low Risk  (02/08/2020)  Social Connections: Socially Isolated (02/11/2024)  Tobacco Use: Low Risk  (02/07/2024)     Readmission Risk Interventions    01/15/2022    2:56 PM 01/02/2022    2:03 PM 12/31/2021   10:32 AM  Readmission Risk Prevention Plan  Transportation Screening Complete Complete Complete  Medication Review Oceanographer) Complete Complete Complete  PCP or Specialist appointment within 3-5 days of discharge Complete Complete Complete  HRI or Home Care Consult Complete Complete Complete  SW Recovery Care/Counseling Consult Complete Complete Complete  Palliative Care Screening Not Applicable Not Applicable Not Applicable  Skilled Nursing Facility Not Applicable Not Applicable Not Applicable

## 2024-03-10 ENCOUNTER — Inpatient Hospital Stay (HOSPITAL_COMMUNITY)

## 2024-03-10 DIAGNOSIS — R739 Hyperglycemia, unspecified: Secondary | ICD-10-CM

## 2024-03-10 DIAGNOSIS — I9589 Other hypotension: Secondary | ICD-10-CM

## 2024-03-10 DIAGNOSIS — R570 Cardiogenic shock: Secondary | ICD-10-CM | POA: Diagnosis not present

## 2024-03-10 DIAGNOSIS — K5900 Constipation, unspecified: Secondary | ICD-10-CM

## 2024-03-10 LAB — ECHOCARDIOGRAM COMPLETE
Area-P 1/2: 2.39 cm2
Calc EF: 58.3 %
Height: 77 in
S' Lateral: 3.6 cm
Single Plane A2C EF: 60.2 %
Single Plane A4C EF: 56 %
Weight: 2973.56 [oz_av]

## 2024-03-10 LAB — CBC
HCT: 28.8 % — ABNORMAL LOW (ref 39.0–52.0)
Hemoglobin: 9.3 g/dL — ABNORMAL LOW (ref 13.0–17.0)
MCH: 35.2 pg — ABNORMAL HIGH (ref 26.0–34.0)
MCHC: 32.3 g/dL (ref 30.0–36.0)
MCV: 109.1 fL — ABNORMAL HIGH (ref 80.0–100.0)
Platelets: 146 10*3/uL — ABNORMAL LOW (ref 150–400)
RBC: 2.64 MIL/uL — ABNORMAL LOW (ref 4.22–5.81)
RDW: 15.6 % — ABNORMAL HIGH (ref 11.5–15.5)
WBC: 8.1 10*3/uL (ref 4.0–10.5)
nRBC: 0 % (ref 0.0–0.2)

## 2024-03-10 LAB — BASIC METABOLIC PANEL WITH GFR
Anion gap: 12 (ref 5–15)
BUN: 26 mg/dL — ABNORMAL HIGH (ref 8–23)
CO2: 26 mmol/L (ref 22–32)
Calcium: 8.6 mg/dL — ABNORMAL LOW (ref 8.9–10.3)
Chloride: 97 mmol/L — ABNORMAL LOW (ref 98–111)
Creatinine, Ser: 5.77 mg/dL — ABNORMAL HIGH (ref 0.61–1.24)
GFR, Estimated: 10 mL/min — ABNORMAL LOW (ref >60)
Glucose, Bld: 114 mg/dL — ABNORMAL HIGH (ref 70–99)
Potassium: 4.2 mmol/L (ref 3.5–5.1)
Sodium: 135 mmol/L (ref 135–145)

## 2024-03-10 LAB — MRSA NEXT GEN BY PCR, NASAL: MRSA by PCR Next Gen: NOT DETECTED

## 2024-03-10 LAB — GLUCOSE, CAPILLARY
Glucose-Capillary: 114 mg/dL — ABNORMAL HIGH (ref 70–99)
Glucose-Capillary: 82 mg/dL (ref 70–99)
Glucose-Capillary: 98 mg/dL (ref 70–99)
Glucose-Capillary: 99 mg/dL (ref 70–99)
Glucose-Capillary: 116 mg/dL — ABNORMAL HIGH (ref 70–99)

## 2024-03-10 LAB — LACTIC ACID, PLASMA
Lactic Acid, Venous: 1.9 mmol/L (ref 0.5–1.9)
Lactic Acid, Venous: 1.6 mmol/L (ref 0.5–1.9)

## 2024-03-10 LAB — VANCOMYCIN, RANDOM: Vancomycin Rm: 30 ug/mL

## 2024-03-10 MED ORDER — MIDODRINE HCL 5 MG PO TABS
10.0000 mg | ORAL_TABLET | Freq: Two times a day (BID) | ORAL | Status: DC
Start: 2024-03-10 — End: 2024-03-10
  Administered 2024-03-10: 10 mg via ORAL
  Filled 2024-03-10: qty 2

## 2024-03-10 MED ORDER — CHLORHEXIDINE GLUCONATE CLOTH 2 % EX PADS
6.0000 | MEDICATED_PAD | Freq: Every day | CUTANEOUS | Status: DC
  Administered 2024-03-11 – 2024-03-16 (×6): 6 via TOPICAL

## 2024-03-10 MED ORDER — LACTATED RINGERS IV BOLUS
1000.0000 mL | Freq: Once | INTRAVENOUS | Status: AC
  Administered 2024-03-10: 1000 mL via INTRAVENOUS

## 2024-03-10 MED ORDER — MIDODRINE HCL 5 MG PO TABS
10.0000 mg | ORAL_TABLET | Freq: Three times a day (TID) | ORAL | Status: DC
  Administered 2024-03-10 – 2024-03-11 (×5): 10 mg via ORAL
  Filled 2024-03-10 (×5): qty 2

## 2024-03-10 MED ORDER — SODIUM CHLORIDE 0.9 % IV SOLN
2.0000 g | INTRAVENOUS | Status: DC
  Administered 2024-03-11 – 2024-03-16 (×2): 2 g via INTRAVENOUS
  Filled 2024-03-10 (×2): qty 12.5

## 2024-03-10 MED ORDER — NOREPINEPHRINE 4 MG/250ML-% IV SOLN
0.0000 ug/min | INTRAVENOUS | Status: DC
  Administered 2024-03-10: 2 ug/min via INTRAVENOUS
  Filled 2024-03-10: qty 250

## 2024-03-10 MED ORDER — SODIUM CHLORIDE 0.9 % IV BOLUS
500.0000 mL | INTRAVENOUS | Status: AC
  Administered 2024-03-10: 500 mL via INTRAVENOUS

## 2024-03-10 MED ORDER — ALBUMIN HUMAN 25 % IV SOLN
25.0000 g | INTRAVENOUS | Status: AC
  Administered 2024-03-10: 25 g via INTRAVENOUS
  Filled 2024-03-10: qty 100

## 2024-03-10 MED ORDER — SODIUM CHLORIDE 0.9 % IV SOLN
2.0000 g | Freq: Once | INTRAVENOUS | Status: AC
  Administered 2024-03-10: 2 g via INTRAVENOUS
  Filled 2024-03-10: qty 12.5

## 2024-03-10 MED ORDER — VANCOMYCIN HCL 750 MG/150ML IV SOLN
750.0000 mg | INTRAVENOUS | Status: DC
  Administered 2024-03-16: 750 mg via INTRAVENOUS
  Filled 2024-03-10 (×3): qty 150

## 2024-03-10 MED ORDER — SODIUM CHLORIDE 0.9 % IV SOLN
250.0000 mL | INTRAVENOUS | Status: AC
  Administered 2024-03-10: 250 mL via INTRAVENOUS

## 2024-03-10 NOTE — Progress Notes (Signed)
 PHARMACY CONSULT NOTE FOR:  OUTPATIENT  PARENTERAL ANTIBIOTIC THERAPY (OPAT)  Informational as the patient will receive antibiotics with hemodialysis outpatient  Indication: Thoracic discitis Regimen: Vancomycin  750 mg/HD-MWF + Cefepime  2g/HD-MWF End date: 03/23/24  IV antibiotic discharge orders are pended. To discharging provider:  please sign these orders via discharge navigator,  Select New Orders & click on the button choice - Manage This Unsigned Work.    Thank you for allowing pharmacy to participate in this patient's care,  Nieves Bars, PharmD, BCCCP Clinical Pharmacist  Phone: [phone] 03/10/2024 11:56 AM  Please check AMION for all [facility] phone numbers After 10:00 PM, call Main Pharmacy [phone] [facility].

## 2024-03-10 NOTE — Progress Notes (Signed)
 Pt noted with HR in the 150s. BP hypotensive on evaluation, Pt endorsed not "feeling well" and blurry vision. Pt A&Ox4. Pt placed in trendelenburg and on call MD notified, Albumin  and Midodrine  ordered and given. Refractory hypotension noted despite interventions. Pt became drowsy and MD was notified again. Labs and bolus ordered, while Rapid response RN and PCCM NP looped in. PCCM NP came to bedside for evaluation and Rapid response RN to hang and titrate levophed. Pt ultimately transferred to [facility] ICU with rapid RN and 5W bedside RN. IV dislodged during transport. Report given at bedside upon arrival. Pt alert and interactive.

## 2024-03-10 NOTE — Progress Notes (Addendum)
 Persistent hypotension-dialysis intolerance Patient's pressure is persistently soft MAP is 50 even after giving albumin  and midodrine .  Giving 500 mL of NS bolus.  Starting Levophed drip and consulting PCCM Dr. Mason Sole to transfer the patient to ICU to continue the pressor support. -Checking CBC, CMP and lactic acid level.  Carrye Goller, MD [facility] 03/10/2024, 1:49 AM

## 2024-03-10 NOTE — Progress Notes (Signed)
 [facility] Progress Note   Subjective:    He was transferred to the ICU overnight for hypotension.  Spoke with HD RN and no UF with his HD treatment overnight due to hypotension.  No note was placed overnight thus far re: his HD treatment.  I called the HD RN.  He verbally reports that the patient was asymptomatic during dialysis and no UF.  (No fluid was removed with HD; goal to start had been 0.5 kg as tolerated and this was not attempted given HR).   BP 133/110 at completion of HD per HD RN verbal report.  HR went up to the 130's during treatment and then had come back down.  Had 2.5 hours of HD.  He was alert and oriented throughout treatment.  He had back pain before HD and he had gotten a PRN prior to HD for same on his regular floor; no complaints of pain during dialysis.  No UOP is charted over 5/22.   Per trens his hypotension followed dialysis.  He got a liter of LR overnight per ICU RN.  He was on levophed and this was turned off at 6am today.    Review of systems:  He denies shortness of breath or chest pain  Denies n/v or diarrhea   Objective Vitals:   03/10/24 0605 03/10/24 0610 03/10/24 0615 03/10/24 0620  BP: 105/62 (!) 155/93 132/74 110/67  Pulse: (!) 58 (!) 59 (!) 54 (!) 54  Resp: 11 14 14 11   Temp:      TempSrc:      SpO2: 98% 100% 98% 98%  Weight:      Height:       Physical Exam  General adult male in bed in no acute distress HEENT normocephalic atraumatic extraocular movements intact sclera anicteric Neck supple trachea midline Lungs clear to auscultation bilaterally normal work of breathing at rest on room air  Heart S1S2 no rub Abdomen soft nontender nondistended Extremities no edema appreciated; right BKA  left TMA  Psych normal mood and affect Access RIJ tunneled dialysis catheter     Additional Objective Labs: Basic Metabolic Panel: Recent Labs  Lab 03/05/24 0909 03/08/24 0436 03/09/24 1637 03/10/24 0233  NA 134* 138 139 135  K  3.9 4.8 4.4 4.2  CL 92* 98 98 97*  CO2 22 20* 25 26  GLUCOSE 116* 100* 95 114*  BUN 54* 64* 41* 26*  CREATININE 9.48* 9.95* 8.07* 5.77*  CALCIUM  9.2 8.6* 8.8* 8.6*  PHOS 7.4* 7.8*  --   --    Liver Function Tests: Recent Labs  Lab 03/05/24 0909 03/08/24 0436  ALBUMIN  3.4* 3.5   No results for input(s): "LIPASE", "AMYLASE" in the last 168 hours. CBC: Recent Labs  Lab 03/05/24 0909 03/08/24 0436 03/09/24 1637 03/10/24 0233  WBC 8.4 9.2 5.0 8.1  NEUTROABS  --   --  2.8  --   HGB 10.1* 11.0* 9.8* 9.3*  HCT 31.1* 33.9* 31.7* 28.8*  MCV 106.5* 107.6* 110.5* 109.1*  PLT 166 169 137* 146*   Blood Culture    Component Value Date/Time   SDES BLOOD BLOOD RIGHT HAND AEROBIC BOTTLE ONLY 02/09/2024 0414   SPECREQUEST  02/09/2024 0414    BOTTLES DRAWN AEROBIC ONLY Blood Culture adequate volume   CULT  02/09/2024 0414    NO GROWTH 5 DAYS Performed at [facility], [address] [place], [place] [address]    REPTSTATUS 02/14/2024 FINAL 02/09/2024 0414    Cardiac Enzymes:  No results for input(s): "CKTOTAL", "CKMB", "CKMBINDEX", "TROPONINI" in the last 168 hours. CBG: Recent Labs  Lab 03/09/24 0744 03/09/24 1216 03/09/24 1642 03/09/24 2150 03/10/24 0359  GLUCAP 97 94 120* 104* 114*   Iron  Studies: No results for input(s): "IRON ", "TIBC", "TRANSFERRIN", "FERRITIN" in the last 72 hours. @lablastinr3 @ Studies/Results: No results found.  Medications:  sodium chloride  10 mL/hr at 03/10/24 0600   sodium chloride      ceFEPime  (MAXIPIME ) IV Stopped (03/08/24 1958)   norepinephrine (LEVOPHED) Adult infusion Stopped (03/10/24 1610)   promethazine  (PHENERGAN ) injection (IM or IVPB) 12.5 mg (03/07/24 1753)   vancomycin  750 mg (03/08/24 1308)    vitamin C   1,000 mg Oral Daily   atorvastatin   80 mg Oral Daily   bethanechol   10 mg Oral BID   Chlorhexidine  Gluconate Cloth  6 each Topical Q0600   Chlorhexidine  Gluconate Cloth  6 each Topical Q0600   Chlorhexidine   Gluconate Cloth  6 each Topical Q0600   darbepoetin (ARANESP ) injection - DIALYSIS  100 mcg Subcutaneous Q Sat-1800   heparin  injection (subcutaneous)  5,000 Units Subcutaneous Q8H   lactulose   30 g Oral Daily   levothyroxine   50 mcg Oral QAC breakfast   midodrine   10 mg Oral TID WC   naloxegol  oxalate  12.5 mg Oral Daily   nutrition supplement (JUVEN)  1 packet Oral BID BM   oxyCODONE   30 mg Oral Q8H   polyethylene glycol  17 g Oral BID   senna-docusate  4 tablet Oral BID   sevelamer  carbonate  1,600 mg Oral TID WC   tamsulosin   0.4 mg Oral Daily    Dialysis Orders: TTS [place] 4h  B400  92kg  TDC  Heparin  none - Mircera 225mcg IV q 2 weeks (last 4/9) - Calcitriol  0.63mcg PO q HD  Assessment/Plan: 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 CT imaging showed T9-10 discitis. He refused MRI, area was too small for aspiration per IR. H/o of foot infection as below. Concerned that this infection may have seeded the spine. Blood Cxs were negative. Not an operative candidate per neurosurgery.   Per ID continue IV Vanc 750mg  and IV Cefepime  2gm with HD x 6 weeks thru 03/23/24 for the discitis. Will need to adjust his antibiotics schedule given his change in HD schedule Left foot osteomyelitis - completed an 8 week course of Dapto/Cefepime  on 3/28 for bacteremia presumed due to foot infection. S/p left TMA on 4/30. Antibiotics as above.  Hyperkalemia - continue renal diet.improved  ESRD:  Transitioned to MWF schedule for HD to align with his outpatient schedule  Hypotension: hypotension overnight. We have been doing little to no UF with HD and per HD RN no UF on 5/21 treatment.  Got 1 liter LR overnight and he was initiated on midodrine  - may be able to reduce the midodrine  over time.  Anemia: Continue weekly aranesp . No venofer in setting of infection.  Note decline in hemoglobin.  Metabolic bone disease: hyperphos - continue home binders  Nutrition: Cont prot supp for low albumin     Disposition - per primary team. For SNF placement.  Off of pressors now after hypotension overnight   Nan Aver, MD 03/10/2024  6:59 AM

## 2024-03-10 NOTE — Progress Notes (Signed)
 Contacted [facility] to be advised that pt will not be starting tomorrow. Will assist as needed.   Lauraine Polite Renal Navigator [phone]

## 2024-03-10 NOTE — Progress Notes (Signed)
  Echocardiogram 2D Echocardiogram has been performed.  Thomas [name] 03/10/2024, 8:48 AM

## 2024-03-10 NOTE — Progress Notes (Signed)
 Pharmacy Antibiotic Note  Thomas [name] is a 73 y.o. male admitted on 02/07/2024 with osteo/dicistis.   Chronic discitis - ID following. Plan for vancomycin  and cefepime  with HD. HD schedule changing from TTS to MWF - last session on 5/21 (no doses received after session). Vancomycin  random this morning came back at 30. WBC WNL, afebrile.   Plan: Continue vanc 750 mg IV MWF w/ HD - no extra dose today given VR result, start 5/23 Cefepime  2g IV x1 since no dose yesterday after session then change regimen to 2g IV MWF w/ HD Monitor iHD schedule if would need any adjustment again  Height: 6\' 5"  (195.6 cm) Weight: 84.3 kg (185 lb 13.6 oz) IBW/kg (Calculated) : 89.1  Temp (24hrs), Avg:98.3 F (36.8 C), Min:97.9 F (36.6 C), Max:99 F (37.2 C)  Recent Labs  Lab 03/05/24 0909 03/08/24 0436 03/09/24 1637 03/10/24 0233 03/10/24 0658  WBC 8.4 9.2 5.0 8.1  --   CREATININE 9.48* 9.95* 8.07* 5.77*  --   LATICACIDVEN  --   --   --  1.9 1.6  VANCORANDOM  --   --   --   --  30    Estimated Creatinine Clearance: 14.2 mL/min (A) (by C-G formula based on SCr of 5.77 mg/dL (H)).    Allergies  Allergen Reactions   Other Other (See Comments)    Blood pressure issues Per [facility] hospital: Pt states he can only take these pain meds or else he gets very sick: Oxycontin ,morphine ,demerol, and dilaudid  are the only pain meds pt states he can take.     Oxymorphone Other (See Comments)    Causes kidney problems   Amitriptyline Other (See Comments)   Aspirin Nausea And Vomiting    Gi upset, has kidney issues   Beta Vulgaris Nausea And Vomiting   Buspirone Other (See Comments)    Affected his head    Cabbage Nausea And Vomiting   Codeine Other (See Comments)    Pt does not remember the reaction but knows he can not take    Cyclobenzaprine Nausea And Vomiting   Diflunisal Nausea And Vomiting and Other (See Comments)   Fish Allergy Nausea And Vomiting   Fish-Derived Products Nausea And  Vomiting   Hydroxyzine Nausea And Vomiting   Levetiracetam Other (See Comments)   Methadone Other (See Comments)    Made him loopy   Nalbuphine Other (See Comments)    Cutaneous eruption (morphologic abnormality)   Pentazocine Other (See Comments)    Pt does not remember the reaction    Propoxyphene Other (See Comments)    Pt does not remember the reaction     Shellfish Allergy Nausea And Vomiting   Sulfa Antibiotics Hives   Sulfasalazine Other (See Comments)    Pt does not remember the reaction   Amoxicillin  Nausea And Vomiting and Rash    Per [facility]    Thank you for allowing pharmacy to participate in this patient's care,  Nieves Bars, PharmD, BCCCP Clinical Pharmacist  Phone: [phone] 03/10/2024 9:17 AM  Please check AMION for all [facility] Pharm[facility] phone numbers After 10:00 PM, call Main Pharmacy [phone]

## 2024-03-10 NOTE — Consult Note (Signed)
 NAME:  Thomas [name], MRN:  [id number], DOB:  May 29, 1951, LOS: 31 ADMISSION DATE:  02/07/2024, CONSULTATION DATE: 5/22 REFERRING MD: Dr. 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  Imaging revealed acute osteomyelitis involving T9-T11.  He was also found to have osteomyelitis of his left foot.  He was deemed to not be a neurosurgical candidate and has been managed medically with antibiotics.  He was taken on 4/28 by Dr. Julio Ohm for left transmetatarsal amputation.  He has had a prolonged postoperative course which is not immediately clear to me why he has remained inpatient for so long inception of requiring IV antibiotics.  PCCM was called to evaluate the patient on 5/22 for hypotension.  Sounds like he had received dialysis from 5/20 and 5/21 and then 5/22 at the early a.m. hours he became hypotensive with systolic blood pressures in the 50s and 60s.  This did not improve with albumin  administration and the patient was initiated on norepinephrine.  PCCM's been asked to transfer to ICU.  Pertinent  Medical History   has a past medical history of Arthritis, BPH (benign prostatic hyperplasia) (11/24/2019), Chronic pain syndrome (11/24/2019), Diabetes mellitus without complication (HCC), ESRD (end stage renal disease) (HCC), Gout, Hypertension, Muscle pain, Physical deconditioning (11/24/2019), Scars, and Swelling.   Significant Hospital Events: Including procedures, antibiotic start and stop dates in addition to other pertinent events   4/22 admit 4/28 transmetatarsal amputation  Interim  History / Subjective:    Objective    Blood pressure (!) 62/51, pulse 65, temperature 97.9 F (36.6 C), resp. rate 20, height 6\' 5"  (1.956 m), weight 84 kg, SpO2 97%.        Intake/Output Summary (Last 24 hours) at 03/10/2024 4098 Last data filed at 03/09/2024 2201 Gross per 24 hour  Intake --  Output 0 ml  Net 0 ml   Filed Weights   03/08/24 0500 03/09/24 0457 03/09/24 1753  Weight: 85.1 kg 87.2 kg 84 kg    Examination: General: Elderly appearing male in no acute distress HENT: Normocephalic, atraumatic, PERRL Lungs: Clear bilateral breath sounds Cardiovascular: Regular rate and rhythm, no murmurs Abdomen: Soft, nontender, nondistended Extremities: Remote right BKA, left foot incision post amputation with some drainage but overall the site looks pretty good. Neuro: Lethargic, but does arouse to verbal stimuli.  Follows commands.  Resolved problem list   Assessment and Plan   Hypotension: Etiology not entirely clear.  Did not happen with her immediately after dialysis, did not happen immediately after fentanyl  administration.  Seem to arise spontaneously over the course of the night.  No labs at this time to confirm shock although I suspect the labs will confirm it. Hopefully this is all hypovolemia secondary to UF. I&O was not documented at HD. - Transferred to ICU - Norepinephrine infusion for MAP goal 65 - Echocardiogram - Will continue current antibiotics for now. Cefepime  and vancomycin  Per ID. - IVF bolus - Continue midodrine , will increase - Check lactic acid, CBC, and chemistry  Acute osteomyelitis and discitis T9-T11 - not a surgical candidate - Plan for 6 total weeks cefepime , vanco with stop date 6/4. -  TLSO brace when out of bed.  - Opioids on hold until hemodynamics improve.   Osteomyelitis of the left foot involving 1st/2nd MTP joints - s/p amputation 4/28 - Toe touch weightbearing - routine incisional care and dressing changes.   ESRD on HD -  Transitioning to MWF HD - Per Nephro  Constipation secondary to narcotics - Miralax   Prolonged QTc - avoid prolonging medications  Chronic HFpEF - Supportive care - careful with volume resuscitation  Hypothyroid - synthroid   Urinary retention - Flomax /urecholine    Dispo: was supposed to go to [facility] 5/22, seems unlikely now.   Best Practice (right click and "Reselect all SmartList Selections" daily)   Diet/type: NPO DVT prophylaxis prophylactic heparin   Pressure ulcer(s): pressure ulcer assessment deferred  GI prophylaxis: N/A Lines: Dialysis Catheter Foley:  N/A Code Status:  full code Last date of multidisciplinary goals of care discussion [ ]   Labs   CBC: Recent Labs  Lab 03/05/24 0909 03/08/24 0436 03/09/24 1637  WBC 8.4 9.2 5.0  NEUTROABS  --   --  2.8  HGB 10.1* 11.0* 9.8*  HCT 31.1* 33.9* 31.7*  MCV 106.5* 107.6* 110.5*  PLT 166 169 137*    Basic Metabolic Panel: Recent Labs  Lab 03/05/24 0909 03/08/24 0436 03/09/24 1637  NA 134* 138 139  K 3.9 4.8 4.4  CL 92* 98 98  CO2 22 20* 25  GLUCOSE 116* 100* 95  BUN 54* 64* 41*  CREATININE 9.48* 9.95* 8.07*  CALCIUM  9.2 8.6* 8.8*  PHOS 7.4* 7.8*  --    GFR: Estimated Creatinine Clearance: 10.1 mL/min (A) (by C-G formula based on SCr of 8.07 mg/dL (H)). Recent Labs  Lab 03/05/24 0909 03/08/24 0436 03/09/24 1637  WBC 8.4 9.2 5.0    Liver Function Tests: Recent Labs  Lab 03/05/24 0909 03/08/24 0436  ALBUMIN  3.4* 3.5   No results for input(s): "LIPASE", "AMYLASE" in the last 168 hours. No results for input(s): "AMMONIA" in the last 168 hours.  ABG    Component Value Date/Time   PHART 7.384 09/23/2021 2000   PCO2ART 34.9 09/23/2021 2000   PO2ART 110 (H) 09/23/2021 2000   HCO3 20.4 09/23/2021 2000   TCO2 25 02/08/2024 0643   ACIDBASEDEF 3.5 (H) 09/23/2021 2000   O2SAT 98.0 09/23/2021 2000     Coagulation Profile: No results for input(s): "INR", "PROTIME" in the last 168  hours.  Cardiac Enzymes: No results for input(s): "CKTOTAL", "CKMB", "CKMBINDEX", "TROPONINI" in the last 168 hours.  HbA1C: Hgb A1c MFr Bld  Date/Time Value Ref Range Status  02/09/2024 04:14 AM 4.2 (L) 4.8 - 5.6 % Final    Comment:    (NOTE) Pre diabetes:          5.7%-6.4%  Diabetes:              >6.4%  Glycemic control for   <7.0% adults with diabetes   12/19/2021 05:43 AM 4.8 4.8 - 5.6 % Final    Comment:    (NOTE) Pre diabetes:          5.7%-6.4%  Diabetes:              >6.4%  Glycemic control for   <7.0% adults with diabetes     CBG: Recent Labs  Lab 03/08/24 2111 03/09/24 0744 03/09/24 1216 03/09/24 1642 03/09/24 2150  GLUCAP 100* 97 94 120* 104*    Review of Systems:   Bolds are positive  Constitutional: weight loss, gain, night sweats, Fevers, chills, fatigue .  Lightheaded HEENT: headaches, Sore throat, sneezing, nasal congestion, post nasal drip, Difficulty swallowing, Tooth/dental problems, visual complaints visual changes, ear ache CV:  chest pain, radiates:,Orthopnea, PND, swelling in lower extremities, dizziness, palpitations, syncope.  GI  heartburn, indigestion, abdominal pain, nausea, vomiting, diarrhea, change in bowel habits, loss of appetite, bloody stools.  Resp: cough, productive: , hemoptysis, dyspnea, chest pain, pleuritic.  Skin: rash or itching or icterus GU: dysuria, change in color of urine, urgency or frequency. flank pain, hematuria  MS: joint pain or swelling. decreased range of motion  Psych: change in mood or affect. depression or anxiety.  Neuro: difficulty with speech, weakness, numbness, ataxia    Past Medical History:  He,  has a past medical history of Arthritis, BPH (benign prostatic hyperplasia) (11/24/2019), Chronic pain syndrome (11/24/2019), Diabetes mellitus without complication (HCC), ESRD (end stage renal disease) (HCC), Gout, Hypertension, Muscle pain, Physical deconditioning (11/24/2019), Scars, and Swelling.    Surgical History:   Past Surgical History:  Procedure Laterality Date   AMPUTATION Left 02/17/2024   Procedure: AMPUTATION, FOOT, PARTIAL;  Surgeon: Timothy Ford, MD;  Location: [facility] OR;  Service: Orthopedics;  Laterality: Left;  LEFT TRANSMETATARSAL AMPUTATION   BELOW KNEE LEG AMPUTATION Right    ESOPHAGOGASTRODUODENOSCOPY (EGD) WITH PROPOFOL  N/A 11/25/2019   Procedure: ESOPHAGOGASTRODUODENOSCOPY (EGD) WITH PROPOFOL ;  Surgeon: Alvis Jourdain, MD;  Location: [facility] ENDOSCOPY;  Service: Endoscopy;  Laterality: N/A;   IR FLUORO GUIDE CV LINE RIGHT  09/05/2019   IR LUMBAR DISC ASPIRATION W/IMG GUIDE  11/26/2019   IR US  GUIDE VASC ACCESS RIGHT  09/05/2019   RADIOLOGY WITH ANESTHESIA N/A 11/25/2019   Procedure: Thoracic and Lumbar MRI WITH ANESTHESIA;  Surgeon: Radiologist, Medication, MD;  Location: [facility] OR;  Service: Radiology;  Laterality: N/A;     Social History:   reports that he has never smoked. He has never been exposed to tobacco smoke. He has never used smokeless tobacco. He reports that he does not drink alcohol and does not use drugs.   Family History:  His family history includes Cancer in his father; Heart disease in his mother.   Allergies Allergies  Allergen Reactions   Other Other (See Comments)    Blood pressure issues Per [facility] hospital: Pt states he can only take these pain meds or else he gets very sick: Oxycontin ,morphine ,demerol, and dilaudid  are the only pain meds pt states he can take.     Oxymorphone Other (See Comments)    Causes kidney problems   Amitriptyline Other (See Comments)   Aspirin Nausea And Vomiting    Gi upset, has kidney issues   Beta Vulgaris Nausea And Vomiting   Buspirone Other (See Comments)    Affected his head    Cabbage Nausea And Vomiting   Codeine Other (See Comments)    Pt does not remember the reaction but knows he can not take    Cyclobenzaprine Nausea And Vomiting   Diflunisal Nausea And Vomiting and Other (See Comments)   Fish  Allergy Nausea And Vomiting   Fish-Derived Products Nausea And Vomiting   Hydroxyzine Nausea And Vomiting   Levetiracetam Other (See Comments)   Methadone Other (See Comments)    Made him loopy   Nalbuphine Other (See Comments)    Cutaneous eruption (morphologic abnormality)   Pentazocine Other (See Comments)    Pt does not remember the reaction    Propoxyphene Other (See Comments)    Pt does not remember the reaction     Shellfish Allergy Nausea And Vomiting  Sulfa Antibiotics Hives   Sulfasalazine Other (See Comments)    Pt does not remember the reaction   Amoxicillin  Nausea And Vomiting and Rash    Per [facility] Medications  Prior to Admission medications   Medication Sig Start Date End Date Taking? Authorizing Provider  amLODipine  (NORVASC ) 10 MG tablet Take 10 mg by mouth daily. 01/25/24  Yes [provider]  atorvastatin  (LIPITOR ) 80 MG tablet Take 80 mg by mouth daily.   Yes [provider]  bethanechol  (URECHOLINE ) 10 MG tablet Take 10 mg by mouth 2 (two) times daily. 12/21/23  Yes [provider]  ceFEPime  (MAXIPIME ) IVPB Inject 2 g into the vein Every Tuesday,Thursday,and Saturday with dialysis. Indication:  Discitis/osteo Last Day of Therapy:  03/23/24 Labs - Once weekly:  CBC/D and BMP, ESR and CRP Method of administration: Per hemodialysis protocol, to be given at HD center= 02/11/24 03/23/24 Yes Liane Redman, MD  Cholecalciferol  125 MCG (5000 UT) capsule Take 5,000 Units by mouth daily.   Yes [provider]  cloNIDine  (CATAPRES ) 0.1 MG tablet Take 0.1 mg by mouth 3 (three) times daily as needed.   Yes [provider]  cyanocobalamin  (VITAMIN B12) 1000 MCG tablet Take 1 tablet by mouth daily.   Yes [provider]  hydrALAZINE  (APRESOLINE ) 25 MG tablet Take 25 mg by mouth 3 (three) times daily.   Yes [provider]  levothyroxine  (SYNTHROID ) 50 MCG tablet Take 50 mcg by mouth daily before  breakfast.    Yes [provider]  losartan  (COZAAR ) 50 MG tablet Take 50 mg by mouth at bedtime. 11/11/23  Yes [provider]  magnesium  oxide (MAG-OX) 400 MG tablet Take 800 mg by mouth in the morning, at noon, and at bedtime.   Yes [provider]  metoprolol  tartrate (LOPRESSOR ) 50 MG tablet Take 1 tablet (50 mg total) by mouth 2 (two) times daily. Patient taking differently: Take 50 mg by mouth 2 (two) times daily as needed (Hypertension). 12/24/21  Yes Samtani, Jai-Gurmukh, MD  OXYCONTIN  30 MG 12 hr tablet Take 30 mg by mouth 3 (three) times daily as needed (pain). 12/30/21  Yes [provider]  senna-docusate (SENOKOT-S) 8.6-50 MG tablet Take 4 tablets by mouth 2 (two) times daily. 11/04/22  Yes [provider]  sevelamer  (RENAGEL ) 800 MG tablet Take 1,600 mg by mouth 3 (three) times daily. 08/27/23  Yes [provider]  tamsulosin  (FLOMAX ) 0.4 MG CAPS capsule Take 1 capsule (0.4 mg total) by mouth daily. 09/25/20  Yes Avi Body, MD  tiZANidine  (ZANAFLEX ) 4 MG tablet Take 4 mg by mouth every 8 (eight) hours as needed for muscle spasms. 11/26/21  Yes [provider]  Vitamin E 670 MG (1000 UT) CAPS Take 1,000 mg by mouth daily.   Yes [provider]  furosemide  (LASIX ) 40 MG tablet Take 40 mg by mouth daily. 11/26/23   [provider]  vancomycin  IVPB Inject 750 mg into the vein Every Tuesday,Thursday,and Saturday with dialysis. Indication:  Discitis/osteo Last Day of Therapy:  03/23/24 Labs - Once weekly:  CBC/D, BMP, and pre-HD Vanc level (goal 15-25 mcg/ml), ESR and CRP Method of administration: Per hemodialysis protocol - to be given at HD center 02/23/24 03/23/24  Liane Redman, MD  warfarin (COUMADIN ) 10 MG tablet Take 10 mg by mouth daily. Patient not taking: Reported on 02/09/2024    [provider]     Critical care time: 46 minutes  Roz Cornelia, AGACNP-BC [facility]  See Amion for personal pager PCCM on call pager [phone] until 7pm. Please call Elink 7p-7a. [phone]  03/10/2024 3:05 AM

## 2024-03-10 NOTE — Progress Notes (Signed)
 Notified of order for levophed gtt for SBP-50s. PCCM NP at bedside on RRT arrival. Levo gtt started and titrated per parameters. Pt txed to 2H09 with 5W RN and RR RN. En route, R PIV with levo gtt displaced, SBP-140s at that time. Once in 2H, R HDC red port heparin  removed and port accessed for levo gtt. PCCM NP Hoffman notified.

## 2024-03-10 NOTE — Progress Notes (Signed)
 NAME:  Thomas [name], MRN:  [id number], DOB:  1951/09/02, LOS: 31 ADMISSION DATE:  02/07/2024, CONSULTATION DATE: 5/22 REFERRING MD: Dr. 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  Imaging revealed acute osteomyelitis involving T9-T11.  He was also found to have osteomyelitis of his left foot.  He was deemed to not be a neurosurgical candidate and has been managed medically with antibiotics.  He was taken on 4/28 by Dr. Julio Ohm for left transmetatarsal amputation.  He has had a prolonged postoperative course which is not immediately clear to me why he has remained inpatient for so long exception of requiring IV antibiotics.  PCCM was called to evaluate the patient on 5/22 for hypotension.  Sounds like he had received dialysis from 5/20 and 5/21 and then 5/22 at the early a.m. hours he became hypotensive with systolic blood pressures in the 50s and 60s.  This did not improve with albumin  administration and the patient was initiated on norepinephrine.  PCCM's been asked to transfer to ICU.  Pertinent  Medical History   has a past medical history of Arthritis, BPH (benign prostatic hyperplasia) (11/24/2019), Chronic pain syndrome (11/24/2019), Diabetes mellitus without complication (HCC), ESRD (end stage renal disease) (HCC), Gout, Hypertension, Muscle pain, Physical deconditioning (11/24/2019), Scars, and Swelling.   Significant Hospital Events: Including procedures, antibiotic start and stop dates in addition to other pertinent events   4/22 admit 4/28 transmetatarsal amputation 4/28 to 5/21  getting IV abx. Awaiting SNF  Interim History / Subjective:  Now in ICU received fluid challenge. Lactate was reassuring. Off NE since 0620 this am no distress   Objective    Blood pressure 108/75, pulse (!) 50, temperature 98.2 F (36.8 C), temperature source Axillary, resp. rate 11, height 6\' 5"  (1.956 m), weight 84.3 kg, SpO2 94%.        Intake/Output Summary (Last 24 hours) at 03/10/2024 1005 Last data filed at 03/10/2024 0600 Gross per 24 hour  Intake 852.94 ml  Output 0 ml  Net 852.94 ml   Filed Weights   03/09/24 0457 03/09/24 1753 03/10/24 0346  Weight: 87.2 kg 84 kg 84.3 kg    Examination:  General chronically ill appearing 73 year old male laying in bed no distress HENT NCAT no JVD MMM Pulm clear. Dec bases. No accessory use. On room air Card rrr currently no MRG. Formal read of echo pending Abd soft  Ext warm LLE surgical site well approx.  Neuro awake oriented x 3 generalized weakness Resolved problem list   Assessment and Plan   Hypovolemic shock (resolved) - following serial days of iHD. Resolved w/ IVF replacement. Also his CCB was stopped.  ECHO w/ EF ~60% Plan Would NOT resume CCB Cont Midodrine  May need to reconsider planned dry weight   Acute osteomyelitis and discitis T9-T11 - not a surgical candidate Plan Complete 6 total weeks cefepime , vanco with stop date 6/4. TLSO brace when out of bed.  Caution w/ analgesia   Osteomyelitis of the left foot involving 1st/2nd MTP joints - s/p amputation 4/28 Plan Toe touch weightbearing routine incisional care and dressing changes.   ESRD on HD -  Transitioning to MWF HD Plan Per Nephro  Constipation secondary to narcotics Plan Miralax   Prolonged QTc Plan avoid prolonging medications  Chronic HFpEF.  Prelim EF from ECHO Today 60s  Plan Holding antihypertensives Volume removal w/ iHD   Hypothyroid Plan synthroid   Urinary retention Plan Flomax /urecholine    Dispo: was supposed to go  to [facility] 5/22 (today) would monitor 48 more hours or so   Best Practice (right click and "Reselect all SmartList Selections" daily)   Diet/type: Regular consistency (see orders) DVT prophylaxis prophylactic heparin   Pressure ulcer(s): pressure ulcer assessment deferred  GI prophylaxis: N/A Lines: Dialysis Catheter Foley:  N/A Code Status:  full code Last date of multidisciplinary goals of care discussion [ ]    Critical care time: NA      OK to go back to medical renal floor

## 2024-03-10 NOTE — Progress Notes (Signed)
 Physical Therapy Treatment Patient Details Name: Thomas [name] MRN: [id number] DOB: [birth year] Today's Date: 03/10/2024   History of Present Illness The pt is a 73 yo male presenting 4/20 with back pain after a fall at home. Work up concerning for T9-11 discitis/osteomyelitis and L foot osteomyelitis. 4/30 L transmetatarsal amputation. 5/22 transfer to [facility] for persistent hypotension. 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 Pt declined bed mobility/transfer due to pain in low back. When offered pain meds from RN, pt declined due to it not being strong enough. After education on the importance of mobility, pt agreed to move to the recliner later after his pain was managed. Pt was able to scoot towards HOB in supine with supervision for safety. He was able to push with B UE and attempted to push with L LE. Educated pt on WB precautions with pt verbalizing understanding. Demonstrated UE HEP with Green TB with additional exercises performed. Continue to recommend <3hrs post acute rehab to work on independence with mobility. Acute PT to follow.   BP 154/78, 67 BPM 96% SpO2 on RA   If plan is discharge home, recommend the following: Two people to help with walking and/or transfers;Two people to help with bathing/dressing/bathroom;Assistance with cooking/housework;Assistance with feeding;Direct supervision/assist for medications management;Direct supervision/assist for financial management;Assist for transportation;Help with stairs or ramp for entrance   Can travel by private vehicle     No  Equipment Recommendations  Hospital bed [facility]- but pt states it will not fit in home)       Precautions / Restrictions Precautions Precautions:  Back;Fall Required Braces or Orthoses: Spinal Brace;Other Brace Spinal Brace: Thoracolumbosacral orthotic;Applied in sitting position Other Brace: Post op shoe Restrictions Weight Bearing Restrictions Per Provider Order: Yes LLE Weight Bearing Per Provider Order: Touchdown weight bearing Other Position/Activity Restrictions: Post op shoe     Mobility  Bed Mobility Overal bed mobility: Needs Assistance    General bed mobility comments: Able to scoot towards HOB in supine with B UE. Cues to not push with L LE to adhere to WB status. Pt declined bed mobility/transfer    Transfers    General transfer comment: pt declined         Communication Communication Communication: No apparent difficulties  Cognition Arousal: Alert Behavior During Therapy: WFL for tasks assessed/performed, Flat affect   PT - Cognitive impairments: No family/caregiver present to determine baseline    PT - Cognition Comments: Can be self-limiting with declining OOB mobility until after receiving specific pain med that was unavailable Following commands: Intact      Cueing Cueing Techniques: Verbal cues, Gestural cues  Exercises General Exercises - Lower Extremity Heel Slides: AROM, Both, 5 reps, Supine Hip ABduction/ADduction: AROM, Both, 5 reps, Supine Straight Leg Raises: AROM, Both, 5 reps, Supine Other Exercises Other Exercises: Demonstrated UE HEP- Green TB for shoulder flexion, elbow flexion, shld ABD Other Exercises: Clamshell with red TB, x5 reps Other Exercises: UE row with red TB, x2 reps        Pertinent Vitals/Pain Pain Assessment Pain Assessment: Faces Faces Pain Scale: Hurts whole lot Pain Location: back Pain Descriptors / Indicators: Grimacing, Guarding Pain Intervention(s): Limited activity within patient's tolerance, Monitored during session, Repositioned (RN offered pain meds, pt declining stating  he wanted dilaudid )     PT Goals (current goals can now be found in the care  plan section) Acute Rehab PT Goals Patient Stated Goal: to reduce pain PT Goal Formulation: With patient Time For Goal Achievement: 03/21/24 Potential to Achieve Goals: Fair Progress towards PT goals: Progressing toward goals    Frequency    Min 2X/week       AM-PAC PT "6 Clicks" Mobility   Outcome Measure  Help needed turning from your back to your side while in a flat bed without using bedrails?: A Little Help needed moving from lying on your back to sitting on the side of a flat bed without using bedrails?: A Little Help needed moving to and from a bed to a chair (including a wheelchair)?: A Little Help needed standing up from a chair using your arms (e.g., wheelchair or bedside chair)?: Total Help needed to walk in hospital room?: Total Help needed climbing 3-5 steps with a railing? : Total 6 Click Score: 12    End of Session   Activity Tolerance: Patient limited by pain Patient left: in bed;with call bell/phone within reach Nurse Communication: Mobility status (RN in room for part of session) PT Visit Diagnosis: Unsteadiness on feet (R26.81);Other abnormalities of gait and mobility (R26.89);Muscle weakness (generalized) (M62.81);Pain;Difficulty in walking, not elsewhere classified (R26.2);History of falling (Z91.81) Pain - part of body:  (Back)     Time: 1610-9604 PT Time Calculation (min) (ACUTE ONLY): 21 min  Charges:    $Therapeutic Exercise: 8-22 mins PT General Charges $$ ACUTE PT VISIT: 1 Visit                     Orysia Blas, PT, DPT Secure Chat Preferred  Rehab Office [phone]   Alissa April Adela Ades 03/10/2024, 12:06 PM

## 2024-03-10 NOTE — Progress Notes (Signed)
   03/09/24 2055  Vitals  BP (!) 133/110  Pulse Rate 78  ECG Heart Rate 86  Resp 16  Oxygen Therapy  SpO2 98 %  During Treatment Monitoring  Blood Flow Rate (mL/min) 0 mL/min  Arterial Pressure (mmHg) 9.9 mmHg  Venous Pressure (mmHg) 44.64 mmHg  TMP (mmHg) 9.29 mmHg  Ultrafiltration Rate (mL/min) 0 mL/min  Dialysate Flow Rate (mL/min) 300 ml/min  Duration of HD Treatment -hour(s) 2.5 hour(s)  Cumulative Fluid Removed (mL) per Treatment  135.69  Post Treatment  Dialyzer Clearance Lightly streaked  Liters Processed 56.4  Fluid Removed (mL) 0 mL  Tolerated HD Treatment Yes  Hemodialysis Catheter Right Internal jugular  Placement Date/Time: 01/02/24 1459   Placed prior to admission: Yes  Orientation: Right  Access Location: Internal jugular  Site Condition No complications  Blue Lumen Status Flushed;Heparin  locked  Red Lumen Status Dead end cap in place  Catheter fill volume (Arterial) 1.6 cc  Catheter fill volume (Venous) 1.6  Dressing Type Transparent  Dressing Status Antimicrobial disc/dressing in place  Drainage Description None  Dressing Change Due 03/10/24  Post treatment catheter status Capped and Clamped   Pt  unable to remove fluid due to changing HR

## 2024-03-11 DIAGNOSIS — M4624 Osteomyelitis of vertebra, thoracic region: Secondary | ICD-10-CM | POA: Diagnosis not present

## 2024-03-11 LAB — CBC WITH DIFFERENTIAL/PLATELET
Abs Immature Granulocytes: 0.06 10*3/uL (ref 0.00–0.07)
Basophils Absolute: 0 10*3/uL (ref 0.0–0.1)
Basophils Relative: 1 %
Eosinophils Absolute: 0.4 10*3/uL (ref 0.0–0.5)
Eosinophils Relative: 6 %
HCT: 32.4 % — ABNORMAL LOW (ref 39.0–52.0)
Hemoglobin: 10.2 g/dL — ABNORMAL LOW (ref 13.0–17.0)
Immature Granulocytes: 1 %
Lymphocytes Relative: 12 %
Lymphs Abs: 0.9 10*3/uL (ref 0.7–4.0)
MCH: 34.8 pg — ABNORMAL HIGH (ref 26.0–34.0)
MCHC: 31.5 g/dL (ref 30.0–36.0)
MCV: 110.6 fL — ABNORMAL HIGH (ref 80.0–100.0)
Monocytes Absolute: 0.5 10*3/uL (ref 0.1–1.0)
Monocytes Relative: 7 %
Neutro Abs: 5.4 10*3/uL (ref 1.7–7.7)
Neutrophils Relative %: 73 %
Platelets: 135 10*3/uL — ABNORMAL LOW (ref 150–400)
RBC: 2.93 MIL/uL — ABNORMAL LOW (ref 4.22–5.81)
RDW: 15.4 % (ref 11.5–15.5)
Smear Review: NORMAL
WBC: 7.3 10*3/uL (ref 4.0–10.5)
nRBC: 0 % (ref 0.0–0.2)

## 2024-03-11 LAB — GLUCOSE, CAPILLARY
Glucose-Capillary: 102 mg/dL — ABNORMAL HIGH (ref 70–99)
Glucose-Capillary: 73 mg/dL (ref 70–99)
Glucose-Capillary: 103 mg/dL — ABNORMAL HIGH (ref 70–99)

## 2024-03-11 LAB — COMPREHENSIVE METABOLIC PANEL WITH GFR
ALT: 11 U/L (ref 0–44)
AST: 19 U/L (ref 15–41)
Albumin: 3.5 g/dL (ref 3.5–5.0)
Alkaline Phosphatase: 49 U/L (ref 38–126)
Anion gap: 15 (ref 5–15)
BUN: 41 mg/dL — ABNORMAL HIGH (ref 8–23)
CO2: 23 mmol/L (ref 22–32)
Calcium: 8.8 mg/dL — ABNORMAL LOW (ref 8.9–10.3)
Chloride: 99 mmol/L (ref 98–111)
Creatinine, Ser: 8.44 mg/dL — ABNORMAL HIGH (ref 0.61–1.24)
GFR, Estimated: 6 mL/min — ABNORMAL LOW (ref >60)
Glucose, Bld: 151 mg/dL — ABNORMAL HIGH (ref 70–99)
Potassium: 5.1 mmol/L (ref 3.5–5.1)
Sodium: 137 mmol/L (ref 135–145)
Total Bilirubin: 0.5 mg/dL (ref 0.0–1.2)
Total Protein: 8.1 g/dL (ref 6.5–8.1)

## 2024-03-11 MED ORDER — MIDODRINE HCL 5 MG PO TABS
5.0000 mg | ORAL_TABLET | Freq: Three times a day (TID) | ORAL | Status: DC
  Administered 2024-03-11 – 2024-03-16 (×14): 5 mg via ORAL
  Filled 2024-03-11 (×12): qty 1

## 2024-03-11 MED ORDER — HEPARIN SODIUM (PORCINE) 1000 UNIT/ML DIALYSIS
1000.0000 [IU] | INTRAMUSCULAR | Status: DC | PRN
  Administered 2024-03-12: 1000 [IU]
  Filled 2024-03-11: qty 1

## 2024-03-11 MED ORDER — ALTEPLASE 2 MG IJ SOLR: 2.0000 mg | Freq: Once | INTRAMUSCULAR | Status: DC | PRN

## 2024-03-11 MED ORDER — PENTAFLUOROPROP-TETRAFLUOROETH EX AERO: 1.0000 | INHALATION_SPRAY | CUTANEOUS | Status: DC | PRN

## 2024-03-11 MED ORDER — RENA-VITE PO TABS
1.0000 | ORAL_TABLET | Freq: Every day | ORAL | Status: DC
  Administered 2024-03-11 – 2024-03-15 (×5): 1 via ORAL
  Filled 2024-03-11 (×5): qty 1

## 2024-03-11 MED ORDER — ANTICOAGULANT SODIUM CITRATE 4% (200MG/5ML) IV SOLN: 5.0000 mL | Status: DC | PRN

## 2024-03-11 MED ORDER — HYDROMORPHONE HCL 1 MG/ML IJ SOLN
1.0000 mg | Freq: Every day | INTRAMUSCULAR | Status: DC | PRN
  Administered 2024-03-12 – 2024-03-16 (×3): 1 mg via INTRAVENOUS
  Filled 2024-03-11: qty 1

## 2024-03-11 MED ORDER — LIDOCAINE HCL (PF) 1 % IJ SOLN: 5.0000 mL | INTRAMUSCULAR | Status: DC | PRN

## 2024-03-11 MED ORDER — LIDOCAINE-PRILOCAINE 2.5-2.5 % EX CREA: 1.0000 | TOPICAL_CREAM | CUTANEOUS | Status: DC | PRN

## 2024-03-11 NOTE — Progress Notes (Signed)
 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 4/21>> x-ray left wrist: No fracture 4/21>> x-ray left shoulder, no fracture. 4/21>> CT head: No acute intracranial abnormality 4/21>> CT C-spine: No fracture. 4/21>> CT abdomen/pelvis: No acute injury to chest/abdomen/pelvis 4/21>> CT T-spine: Prior posterior fusion from T10-T12-abnormal appearance with collapse of T10-lucency around T10 screws at the T9-10 disc space, T10-11 disc space concerning for discitis/osteomyelitis. 4/21>> CT L-spine: No acute bony abnormality, mild chronic compression fracture at L1. 4/22>> x-ray left foot: Chronic erosive changes about the forefoot similar to 11/21/2023-osteomyelitis/septic arthritis of the 1st/2nd MTP joints difficult to exclude. 4/25>> left ABI: Within normal limit.   Significant microbiology data: 4/22>> blood culture: No growth   Procedures: 4/30>> left transmetatarsal amputation by Dr. Julio Ohm.  He decompensated on 5/22 was sent to ICU because of hypotension?  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-T9-T10 discitis-Nonoperative candidate Osteomyelitis left foot first second metatarsal phalangeal joint on 4/30 with recent completion of Dapto cefepime  on 3/28 IV cefepime  vancomycin  through 6/4 with dialysis Continue Oxy IR 10 every 6 as needed moderate pain, OxyContin  30 every 8 for severe pain, Zanaflex  4 every 8 as needed-limit IV opiates He can do touchdown weightbearing on the left leg  Hypotension secondary to clonidine  losartan  metoprolol  and possible increased dialysis ESRD-hyperkalemia seems resolved after checks Hypotension possibly secondary to trying to challenge EDW transiently on pressors Blood pressure is now improved-on midodrine  now at 5 3 times daily down from prior dosing as he was sweaty and hypotensive as per renal and we will see if these were all interrelated Need to limit pain medication Check anemia panel in the next 24 hours-might need ESA-continue Renvela  1.6 3 times daily   BPH severe hydroureteronephrosis Continue Flomax  0.4, bethanechol  10 twice daily  Left transmetatarsal amputation for osteomyelitis/30 Pain meds as above  Previous history of DVT PE with recurrence in 2023 Previously on Coumadin  now off the same for unclear reason apparently has IVC filter in place He does not recall why he was taken off of this  Chronic HFpEF Now managed with dialysis-BP meds as above  DVT prophylaxis: SCD  Status is: Inpatient Remains inpatient appropriate because:   Awaiting stability for placement   Subjective: Was a little sweaty earlier today no fever-nephrology thinks it may have been from the midodrine  which we are adjusting He is complaining of back pain but seems quite comfortable in the bed  Objective + exam Vitals:   03/10/24 2047 03/11/24 0428 03/11/24 0429 03/11/24 0820  BP:  129/70 (!) 150/96 (!) 150/96 135/80   Pulse: 71 72 71 70  Resp: 18 18 18 19   Temp: 98.9 F (37.2 C) 98.4 F (36.9 C) 98.4 F (36.9 C) 98.5 F (36.9 C)  TempSrc:      SpO2: 100%  100% 100%  Weight:   91.2 kg   Height:       Filed Weights   03/09/24 1753 03/10/24 0346 03/11/24 0429  Weight: 84 kg 84.3 kg 91.2 kg    Examination: EOMI NCAT no focal deficit no icterus no pallor Chest clear Neck soft supple Left TMA looks clean Right BKA looks well-healed Abdominal soft S1-S2?  Murmur  Data Reviewed: reviewed   CBC    Component Value Date/Time   WBC 7.3 03/11/2024 0936   RBC 2.93 (L) 03/11/2024 0936   HGB 10.2 (L) 03/11/2024 0936   HCT 32.4 (L) 03/11/2024 0936   PLT 135 (L) 03/11/2024 0936   MCV 110.6 (H) 03/11/2024 0936   MCH 34.8 (H) 03/11/2024 0936   MCHC 31.5 03/11/2024 0936   RDW 15.4 03/11/2024 0936   LYMPHSABS 0.9 03/11/2024 0936   MONOABS 0.5 03/11/2024 0936   EOSABS 0.4 03/11/2024 0936   BASOSABS 0.0 03/11/2024 0936      Latest Ref Rng & Units 03/11/2024    9:36 AM 03/10/2024    2:33 AM 03/09/2024    4:37 PM  CMP  Glucose 70 - 99 mg/dL 161  096  95   BUN 8 - 23 mg/dL 41  26  41   Creatinine 0.61 - 1.24 mg/dL 0.45  4.09  8.11   Sodium 135 - 145 mmol/L 137  135  139   Potassium 3.5 - 5.1 mmol/L 5.1  4.2  4.4   Chloride 98 - 111 mmol/L 99  97  98   CO2 22 - 32 mmol/L 23  26  25    Calcium  8.9 - 10.3 mg/dL 8.8  8.6  8.8   Total Protein 6.5 - 8.1 g/dL 8.1     Total Bilirubin 0.0 - 1.2 mg/dL 0.5     Alkaline Phos 38 - 126 U/L 49     AST 15 - 41 U/L 19     ALT 0 - 44 U/L 11       Scheduled Meds:  vitamin C   1,000 mg Oral Daily   atorvastatin   80 mg Oral Daily   bethanechol   10 mg Oral BID   Chlorhexidine  Gluconate Cloth  6 each Topical Q0600   Chlorhexidine  Gluconate Cloth  6 each Topical Q0600   Chlorhexidine  Gluconate Cloth  6 each Topical Q0600   Chlorhexidine  Gluconate Cloth  6 each Topical Q0600   darbepoetin (ARANESP ) injection - DIALYSIS  100 mcg Subcutaneous Q Sat-1800    heparin  injection (subcutaneous)  5,000 Units Subcutaneous Q8H   lactulose   30 g Oral Daily   levothyroxine   50 mcg Oral QAC breakfast   midodrine   5 mg Oral TID WC   naloxegol  oxalate  12.5 mg Oral Daily   nutrition supplement (JUVEN)  1 packet Oral BID BM   oxyCODONE   30 mg Oral Q8H   polyethylene glycol  17 g Oral BID   senna-docusate  4 tablet Oral BID   sevelamer  carbonate  1,600 mg Oral TID WC   tamsulosin   0.4 mg Oral Daily   Continuous Infusions:  sodium chloride  10 mL/hr at 03/10/24 9147   anticoagulant sodium citrate      ceFEPime  (MAXIPIME ) IV 2 g (03/11/24 1218)  promethazine  (PHENERGAN ) injection (IM or IVPB) Stopped (03/07/24 1814)   vancomycin       Time 45  Verlie Glisson, MD  [facility]

## 2024-03-11 NOTE — TOC Progression Note (Addendum)
 Transition of Care [facility]) - Progression Note    Patient Details  Name: Thomas [name] MRN: [id number] Date of Birth: [birth year]  Transition of Care [facility]) CM/SW Contact  Katrinka Parr, [place] Phone Number: 03/11/2024, 2:55 PM  Clinical Narrative:     Provider informed CSW that pt has questions about SNF. CSW spoke with pt's previous Child psychotherapist on 5W who informed CSW that she has reviewed SNF offers on multiple occasions, pt had chosen [facility], [place] schedule was changed to [facility], and insurance [employer] was received for [employer].   CSW met with pt bedside. Who states he wants a list of the SNF facilities. He states that no one has ever provided him a list and that he never chose a facility. CSW explained that previous Child psychotherapist had reviewed facilities with pt and he chose [facility]. CSW explained OPHD and insurance auth had all been arranged so he can go to [facility]. [facility] is a 3 star facility which CSW explained but pt is demanding a 4 or 5 star facility. Pt is medically ready for DC to SNF and a safe discharge plan has been identified. CSW explained that [facility] would be only SNF option at this point. Pt states he would go home instead. CSW updated provider.   SNF auth expires today. CSW restarted auth for [facility] in the case that pt agrees to SNF over the weekend([facility] can admit over weekend). Ref# [id number]  Expected Discharge Plan: Skilled Nursing Facility Barriers to Discharge: Continued Medical Work up, English as a second language teacher (OP HD chair)  Expected Discharge Plan and Services     Post Acute Care Choice: Skilled Nursing Facility Living arrangements for the past 2 months: Single Family Home                                       Social Determinants of Health (SDOH) Interventions SDOH Screenings   Food Insecurity: No Food Insecurity (02/09/2024)  Housing: Low Risk  (02/09/2024)  Transportation Needs: No Transportation Needs (02/09/2024)   Utilities: Not At Risk (02/09/2024)  Depression (PHQ2-9): Low Risk  (02/08/2020)  Social Connections: Socially Isolated (02/11/2024)  Tobacco Use: Low Risk  (02/07/2024)    Readmission Risk Interventions    01/15/2022    2:56 PM 01/02/2022    2:03 PM 12/31/2021   10:32 AM  Readmission Risk Prevention Plan  Transportation Screening Complete Complete Complete  Medication Review Oceanographer) Complete Complete Complete  PCP or Specialist appointment within 3-5 days of discharge Complete Complete Complete  HRI or Home Care Consult Complete Complete Complete  SW Recovery Care/Counseling Consult Complete Complete Complete  Palliative Care Screening Not Applicable Not Applicable Not Applicable  Skilled Nursing Facility Not Applicable Not Applicable Not Applicable

## 2024-03-11 NOTE — Progress Notes (Addendum)
 Initial Nutrition Assessment  DOCUMENTATION CODES:   Non-severe (moderate) malnutrition in context of chronic illness  INTERVENTION:  Continue Juven BID for wound healing support  Double portion proteins w/ meals Continue liberalized diet to promote PO intake as it relates to wound healing Add renal MVI w/ minerals   NUTRITION DIAGNOSIS:   Moderate Malnutrition related to chronic illness (ESRD-HD, spinal fusion of thoracic spine and pain associated, DM) as evidenced by moderate muscle depletion, mild fat depletion, percent weight loss.  GOAL:  Patient will meet greater than or equal to 90% of their needs  MONITOR:  PO intake, Supplement acceptance, Skin  REASON FOR ASSESSMENT:  Consult Assessment of nutrition requirement/status  ASSESSMENT:   Pt  with PMH significant for: ESRD-HD, spinal fusion of thoracic spine, gout, HTN, DM. Presented with lower back pain and admitted for discitis and OM of the spine. Found to have infection of L foot for which he had already completed ABX course. L TMA on 4/28.  Hx of R BKA. Now with infection of L foot, which required L TMA on 4/28. Has been admitted since for IV ABX. Will need SNF placement s/p discharge.  Average Meal Intake 5/14: 50-75% x2 documented meals 5/15: 75% x1 documented meal 5/17: 90-100% x2 documented meals 5/20: 100% x1 documented meal  Extensive hx of back pain r/t breaking his back when he was 73 years old. Pains sometimes interferes with appetite, however has not been the case recently.  Intake appears adequate, per documentation and observation today. Consuming lunch meal at time of bedside visit. States he is hungry all the time. Will add double portion proteins with meals. Juven ordered between meals for wound healing. Discussed importance of consuming this supplement in presence of adequate protein intake to promote wound healing and replenish protein losses r/t dialysis.    Remains on regular diet to promote  adequate intake. Labs stable. No fluid restriction in place currently. Has chronic pain, which he reports affects his appetite at times.  Admit Weight: 94.8kg Current Weight: 91.2kg  Per chart review, has shown 26.5% weight loss in last three months. This is considered clinically significant for the time frame reviewed. He notes that he was 340lbs prior to initiation of dialysis in October 2024 and now hovers around 180lbs. Bowels stable. Reports he goes once every 2 days at baseline and requires a suppository to go.   Drains/Lines: RIJ: tunneled dialysis catheter UOP: x24 hours  Meds: ascorbic acid , darbepoetin alfa , levothyroxine , lactulose , levothyroxine , Miralax , senna-docusate, sevelamer  carbonate, IV ABX  Labs from 5/22 reviewed: Na+ 135 (wdl) K+ 4.2 (wdl) CBGs 95-114 x24 hours A1c 4.2 (01/2024)  NUTRITION - FOCUSED PHYSICAL EXAM:  Flowsheet Row Most Recent Value  Orbital Region Mild depletion  Upper Arm Region Mild depletion  Thoracic and Lumbar Region No depletion  Buccal Region Mild depletion  Temple Region Moderate depletion  Clavicle Bone Region Moderate depletion  Clavicle and Acromion Bone Region Moderate depletion  Scapular Bone Region Mild depletion  Dorsal Hand Moderate depletion  Patellar Region Moderate depletion  Anterior Thigh Region Moderate depletion  Posterior Calf Region Moderate depletion  [unable to assess R side d/t BKA]  Edema (RD Assessment) None  Hair Reviewed  Eyes Reviewed  Mouth Reviewed  Skin Reviewed  Nails Reviewed   Diet Order:   Diet Order             Diet regular Fluid consistency: Thin  Diet effective now  EDUCATION NEEDS:  Education needs have been addressed  Skin:  Skin Assessment: Reviewed RN Assessment  Last BM:  5/22  Height:  Ht Readings from Last 1 Encounters:  02/08/24 6\' 5"  (1.956 m)   Weight:  Wt Readings from Last 1 Encounters:  03/11/24 91.2 kg   BMI:  Body mass index is 23.84  kg/m.  Estimated Nutritional Needs:   Kcal:  1800-2000kcal  Protein:  100-115g  Fluid:  1L + UOP  Con Decant MS, RD, LDN Registered Dietitian Clinical Nutrition RD Inpatient Contact Info in Amion

## 2024-03-11 NOTE — Progress Notes (Signed)
 [name] Progress Note   Subjective:    Transferred back to the floor in the interim.  Last HD on 5/21 PM with no UF (kept even).  He had 300 mL UOP over 5/23 thus far.  We discussed new HD schedule would be MWF to align with SNF placement.  He states that he wants to make sure he likes the SNF and I asked him to discuss this with primary team and case management; he agrees that he does need a SNF.    Review of systems:   He denies shortness of breath or chest pain  Reports nausea and states got a PRN for lunch  States he is sweating   Objective Vitals:   03/10/24 2047 03/11/24 0428 03/11/24 0429 03/11/24 0820  BP: 129/70 (!) 150/96 (!) 150/96 135/80  Pulse: 71 72 71 70  Resp: 18 18 18 19   Temp: 98.9 F (37.2 C) 98.4 F (36.9 C) 98.4 F (36.9 C) 98.5 F (36.9 C)  TempSrc:      SpO2: 100%  100% 100%  Weight:   91.2 kg   Height:       Physical Exam   General adult male in bed in no acute distress HEENT normocephalic atraumatic extraocular movements intact sclera anicteric Neck supple trachea midline Lungs clear to auscultation bilaterally normal work of breathing at rest on room air  Heart S1S2 no rub Abdomen soft nontender nondistended Extremities no edema appreciated; right BKA  left TMA  Psych normal mood and affect Access RIJ tunneled dialysis catheter     Additional Objective Labs: Basic Metabolic Panel: Recent Labs  Lab 03/05/24 0909 03/08/24 0436 03/09/24 1637 03/10/24 0233 03/11/24 0936  NA 134* 138 139 135 137  K 3.9 4.8 4.4 4.2 5.1  CL 92* 98 98 97* 99  CO2 22 20* 25 26 23   GLUCOSE 116* 100* 95 114* 151*  BUN 54* 64* 41* 26* 41*  CREATININE 9.48* 9.95* 8.07* 5.77* 8.44*  CALCIUM  9.2 8.6* 8.8* 8.6* 8.8*  PHOS 7.4* 7.8*  --   --   --    Liver Function Tests: Recent Labs  Lab 03/05/24 0909 03/08/24 0436 03/11/24 0936  AST  --   --  19  ALT  --   --  11  ALKPHOS  --   --  49  BILITOT  --   --  0.5  PROT  --   --  8.1   ALBUMIN  3.4* 3.5 3.5   No results for input(s): "LIPASE", "AMYLASE" in the last 168 hours. CBC: Recent Labs  Lab 03/05/24 0909 03/08/24 0436 03/09/24 1637 03/10/24 0233 03/11/24 0936  WBC 8.4 9.2 5.0 8.1 7.3  NEUTROABS  --   --  2.8  --  5.4  HGB 10.1* 11.0* 9.8* 9.3* 10.2*  HCT 31.1* 33.9* 31.7* 28.8* 32.4*  MCV 106.5* 107.6* 110.5* 109.1* 110.6*  PLT 166 169 137* 146* 135*   Blood Culture    Component Value Date/Time   SDES BLOOD BLOOD RIGHT HAND AEROBIC BOTTLE ONLY 02/09/2024 0414   SPECREQUEST  02/09/2024 0414    BOTTLES DRAWN AEROBIC ONLY Blood Culture adequate volume   CULT  02/09/2024 0414    NO GROWTH 5 DAYS Performed at [facility], [address], [place], [place] [address]    REPTSTATUS 02/14/2024 FINAL 02/09/2024 0414    Cardiac Enzymes: No results for input(s): "CKTOTAL", "CKMB", "CKMBINDEX", "TROPONINI" in the last 168 hours. CBG: Recent Labs  Lab 03/10/24 1321 03/10/24  1632 03/10/24 2048 03/11/24 0820 03/11/24 1135  GLUCAP 98 99 116* 102* 73   Iron  Studies: No results for input(s): "IRON ", "TIBC", "TRANSFERRIN", "FERRITIN" in the last 72 hours. @lablastinr3 @ Studies/Results: ECHOCARDIOGRAM COMPLETE Result Date: 03/10/2024    ECHOCARDIOGRAM REPORT   Patient Name:   [name] Date of Exam: 03/10/2024 Medical Rec #:  [id number]       Height:       77.0 in Accession #:    [id number]      Weight:       185.8 lb Date of Birth:  Nov 05, 1950       BSA:          2.168 m Patient Age:    73 years        BP:           110/67 mmHg Patient Gender: M               HR:           62 bpm. Exam Location:  Inpatient Procedure: 2D Echo, Cardiac Doppler and Color Doppler (Both Spectral and Color            Flow Doppler were utilized during procedure). Indications:    Shock  History:        Patient has prior history of Echocardiogram examinations, most                 recent 12/19/2021. Abnormal ECG, Arrythmias:SVT,                 Signs/Symptoms:Chest Pain; Risk  Factors:Hypertension and                 Diabetes. ESRD. Pulmonary embolus.  Sonographer:    Raynelle Callow RDCS Referring Phys: [id number] [facility]  Sonographer Comments: Technically difficult study due to poor echo windows. Patient windows very high. IMPRESSIONS  1. Left ventricular ejection fraction, by estimation, is 50 to 55%. The left ventricle has low normal function. The left ventricle has no regional wall motion abnormalities. Left ventricular diastolic parameters are consistent with Grade I diastolic dysfunction (impaired relaxation). There is the interventricular septum is flattened in systole and diastole, consistent with right ventricular pressure and volume overload.  2. Right ventricular systolic function is moderately reduced. The right ventricular size is moderately enlarged.  3. Right atrial size was moderately dilated.  4. The mitral valve is normal in structure. Mild mitral valve regurgitation. No evidence of mitral stenosis.  5. The aortic valve is tricuspid. There is moderate calcification of the aortic valve. Aortic valve regurgitation is not visualized. No aortic stenosis is present.  6. There is dilatation of the ascending aorta, measuring 41 mm.  7. The inferior vena cava is normal in size with greater than 50% respiratory variability, suggesting right atrial pressure of 3 mmHg. FINDINGS  Left Ventricle: Left ventricular ejection fraction, by estimation, is 50 to 55%. The left ventricle has low normal function. The left ventricle has no regional wall motion abnormalities. The left ventricular internal cavity size was normal in size. There is no left ventricular hypertrophy. The interventricular septum is flattened in systole and diastole, consistent with right ventricular pressure and volume overload. Left ventricular diastolic parameters are consistent with Grade I diastolic dysfunction (impaired relaxation). Right Ventricle: The right ventricular size is moderately enlarged. No increase in  right ventricular wall thickness. Right ventricular systolic function is moderately reduced. Left Atrium: Left atrial size was normal in size. Right Atrium: Right atrial  size was moderately dilated. Pericardium: There is no evidence of pericardial effusion. Mitral Valve: The mitral valve is normal in structure. Mild mitral valve regurgitation. No evidence of mitral valve stenosis. Tricuspid Valve: The tricuspid valve is normal in structure. Tricuspid valve regurgitation is not demonstrated. No evidence of tricuspid stenosis. Aortic Valve: The aortic valve is tricuspid. There is moderate calcification of the aortic valve. Aortic valve regurgitation is not visualized. No aortic stenosis is present. Pulmonic Valve: The pulmonic valve was normal in structure. Pulmonic valve regurgitation is not visualized. No evidence of pulmonic stenosis. Aorta: The aortic root is normal in size and structure. There is dilatation of the ascending aorta, measuring 41 mm. Venous: The inferior vena cava is normal in size with greater than 50% respiratory variability, suggesting right atrial pressure of 3 mmHg. IAS/Shunts: The interatrial septum was not well visualized.  LEFT VENTRICLE PLAX 2D LVIDd:         5.50 cm      Diastology LVIDs:         3.60 cm      LV e' medial:    6.53 cm/s LV PW:         1.00 cm      LV E/e' medial:  9.1 LV IVS:        0.90 cm      LV e' lateral:   11.10 cm/s LVOT diam:     2.90 cm      LV E/e' lateral: 5.3 LV SV:         110 LV SV Index:   51 LVOT Area:     6.61 cm  LV Volumes (MOD) LV vol d, MOD A2C: 121.0 ml LV vol d, MOD A4C: 78.4 ml LV vol s, MOD A2C: 48.1 ml LV vol s, MOD A4C: 34.5 ml LV SV MOD A2C:     72.9 ml LV SV MOD A4C:     78.4 ml LV SV MOD BP:      57.5 ml RIGHT VENTRICLE             IVC RV S prime:     11.60 cm/s  IVC diam: 1.60 cm TAPSE (M-mode): 1.7 cm LEFT ATRIUM             Index        RIGHT ATRIUM           Index LA diam:        3.70 cm 1.71 cm/m   RA Area:     25.00 cm LA Vol (A2C):    80.9 ml 37.31 ml/m  RA Volume:   88.10 ml  40.63 ml/m LA Vol (A4C):   56.0 ml 25.83 ml/m LA Biplane Vol: 69.7 ml 32.15 ml/m  AORTIC VALVE LVOT Vmax:   74.90 cm/s LVOT Vmean:  46.100 cm/s LVOT VTI:    0.166 m  AORTA Ao Root diam: 3.60 cm Ao Asc diam:  4.10 cm MITRAL VALVE MV Area (PHT): 2.39 cm    SHUNTS MV Decel Time: 317 msec    Systemic VTI:  0.17 m MV E velocity: 59.20 cm/s  Systemic Diam: 2.90 cm MV A velocity: 79.70 cm/s MV E/A ratio:  0.74 Aditya Sabharwal Electronically signed by Alwin Baars Signature Date/Time: 03/10/2024/5:56:29 PM    Final     Medications:  sodium chloride  10 mL/hr at 03/10/24 1610   anticoagulant sodium citrate      ceFEPime  (MAXIPIME ) IV 2 g (03/11/24 1218)   promethazine  (PHENERGAN ) injection (IM or IVPB)  Stopped (03/07/24 1814)   vancomycin       vitamin C   1,000 mg Oral Daily   atorvastatin   80 mg Oral Daily   bethanechol   10 mg Oral BID   Chlorhexidine  Gluconate Cloth  6 each Topical Q0600   Chlorhexidine  Gluconate Cloth  6 each Topical Q0600   Chlorhexidine  Gluconate Cloth  6 each Topical Q0600   Chlorhexidine  Gluconate Cloth  6 each Topical Q0600   darbepoetin (ARANESP ) injection - DIALYSIS  100 mcg Subcutaneous Q Sat-1800   heparin  injection (subcutaneous)  5,000 Units Subcutaneous Q8H   lactulose   30 g Oral Daily   levothyroxine   50 mcg Oral QAC breakfast   midodrine   10 mg Oral TID WC   naloxegol  oxalate  12.5 mg Oral Daily   nutrition supplement (JUVEN)  1 packet Oral BID BM   oxyCODONE   30 mg Oral Q8H   polyethylene glycol  17 g Oral BID   senna-docusate  4 tablet Oral BID   sevelamer  carbonate  1,600 mg Oral TID WC   tamsulosin   0.4 mg Oral Daily    Dialysis Orders: TTS [place] previously (not he will be going to [place]) 4h  B400  92kg  TDC  Heparin  none - Mircera 225mcg IV q 2 weeks (last 4/9) - Calcitriol  0.39mcg PO q HD    Assessment/Plan: 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 CT imaging showed T9-10 discitis.  He refused MRI, area was too small for aspiration per IR. H/o of foot infection as below. Concerned that this infection may have seeded the spine. Blood Cxs were negative. Not an operative candidate per neurosurgery.   Per ID continue IV Vanc 750mg  and IV Cefepime  2gm with HD x 6 weeks thru 03/23/24 for the discitis. Note that his HD schedule has changed to MWF Left foot osteomyelitis - completed an 8 week course of Dapto/Cefepime  on 3/28 for bacteremia presumed due to foot infection. S/p left TMA on 4/30. Antibiotics as above.  Hyperkalemia - continue renal diet.improved  ESRD:   Transitioned to MWF schedule for HD to align with his outpatient schedule (note he will now be MWF [place] given the SNF plans) Hypotension: We have been doing little to no UF with HD.  On midodrine  10 mg TID now - hypertensive.  Will reduce midodrine  to 5 mg TID  Anemia: Continue aranesp  100 mcg every Saturday. No venofer in setting of infection Metabolic bone disease: hyperphos - continue home binders.  Phos in AM  Nutrition: prot supp for low albumin    Disposition - per primary team. He has been recommended for SNF placement  Nan Aver, MD 03/11/2024  1:00 PM

## 2024-03-11 NOTE — Progress Notes (Signed)
 Pt was called on and prepared to set up for his ordered HD this afternoon---pt refused to come because he no longer has Dilaudid  IV ordered---both the MD  on the floor and Dr. Yvonnie Heritage are aware of this situation---to be evaluated in the am---

## 2024-03-11 NOTE — Progress Notes (Signed)
 Contacted attending regarding possible d/c date. Advised pt for possible d/c this weekend. Contacted [facility] to be advised that pt for possible d/c to snf this weekend and pt for possible start on Monday. Clinic able to start pt on Monday if d/c by then. Clinic also aware that pt will need iv abx with HD. Clinic has abx available. HD info added to AVS. Contacted renal NP to request that orders be faxed to [facility] at d/c. Will assist as needed.   Lauraine Polite Renal Navigator [phone]

## 2024-03-11 NOTE — Progress Notes (Signed)
 OT Cancellation Note  Patient Details Name: [name] MRN: [id number] DOB: [birth year]   Cancelled Treatment:    Reason Eval/Treat Not Completed: Pain limiting ability to participate. Attempted skilled OT treatment session.  Pt. States his back hurts too much to do anything today.  Will check back as able.    Leory Rands [place]-COTA/L  03/11/2024, 1:09 PM

## 2024-03-12 DIAGNOSIS — M4624 Osteomyelitis of vertebra, thoracic region: Secondary | ICD-10-CM | POA: Diagnosis not present

## 2024-03-12 DIAGNOSIS — E440 Moderate protein-calorie malnutrition: Secondary | ICD-10-CM | POA: Insufficient documentation

## 2024-03-12 LAB — GLUCOSE, CAPILLARY
Glucose-Capillary: 82 mg/dL (ref 70–99)
Glucose-Capillary: 141 mg/dL — ABNORMAL HIGH (ref 70–99)

## 2024-03-12 LAB — FERRITIN: Ferritin: 424 ng/mL — ABNORMAL HIGH (ref 24–336)

## 2024-03-12 LAB — CBC WITH DIFFERENTIAL/PLATELET
Abs Immature Granulocytes: 0.03 10*3/uL (ref 0.00–0.07)
Basophils Absolute: 0 10*3/uL (ref 0.0–0.1)
Basophils Relative: 0 %
Eosinophils Absolute: 0.4 10*3/uL (ref 0.0–0.5)
Eosinophils Relative: 5 %
HCT: 31 % — ABNORMAL LOW (ref 39.0–52.0)
Hemoglobin: 9.8 g/dL — ABNORMAL LOW (ref 13.0–17.0)
Immature Granulocytes: 0 %
Lymphocytes Relative: 8 %
Lymphs Abs: 0.8 10*3/uL (ref 0.7–4.0)
MCH: 34.5 pg — ABNORMAL HIGH (ref 26.0–34.0)
MCHC: 31.6 g/dL (ref 30.0–36.0)
MCV: 109.2 fL — ABNORMAL HIGH (ref 80.0–100.0)
Monocytes Absolute: 0.7 10*3/uL (ref 0.1–1.0)
Monocytes Relative: 8 %
Neutro Abs: 7.2 10*3/uL (ref 1.7–7.7)
Neutrophils Relative %: 79 %
Platelets: 144 10*3/uL — ABNORMAL LOW (ref 150–400)
RBC: 2.84 MIL/uL — ABNORMAL LOW (ref 4.22–5.81)
RDW: 15.4 % (ref 11.5–15.5)
WBC: 9.2 10*3/uL (ref 4.0–10.5)
nRBC: 0 % (ref 0.0–0.2)

## 2024-03-12 LAB — RENAL FUNCTION PANEL
Albumin: 3.6 g/dL (ref 3.5–5.0)
Anion gap: 21 — ABNORMAL HIGH (ref 5–15)
BUN: 50 mg/dL — ABNORMAL HIGH (ref 8–23)
CO2: 20 mmol/L — ABNORMAL LOW (ref 22–32)
Calcium: 8.8 mg/dL — ABNORMAL LOW (ref 8.9–10.3)
Chloride: 97 mmol/L — ABNORMAL LOW (ref 98–111)
Creatinine, Ser: 9.98 mg/dL — ABNORMAL HIGH (ref 0.61–1.24)
GFR, Estimated: 5 mL/min — ABNORMAL LOW (ref >60)
Glucose, Bld: 96 mg/dL (ref 70–99)
Phosphorus: 6.8 mg/dL — ABNORMAL HIGH (ref 2.5–4.6)
Potassium: 4.6 mmol/L (ref 3.5–5.1)
Sodium: 138 mmol/L (ref 135–145)

## 2024-03-12 LAB — RETICULOCYTES
Immature Retic Fract: 22.9 % — ABNORMAL HIGH (ref 2.3–15.9)
RBC.: 2.82 MIL/uL — ABNORMAL LOW (ref 4.22–5.81)
Retic Count, Absolute: 76.1 10*3/uL (ref 19.0–186.0)
Retic Ct Pct: 2.7 % (ref 0.4–3.1)

## 2024-03-12 LAB — VITAMIN B12: Vitamin B-12: 1031 pg/mL — ABNORMAL HIGH (ref 180–914)

## 2024-03-12 LAB — IRON AND TIBC
Iron: 28 ug/dL — ABNORMAL LOW (ref 45–182)
Saturation Ratios: 15 % — ABNORMAL LOW (ref 17.9–39.5)
TIBC: 185 ug/dL — ABNORMAL LOW (ref 250–450)
UIBC: 157 ug/dL

## 2024-03-12 LAB — FOLATE: Folate: 8.3 ng/mL (ref >5.9)

## 2024-03-12 MED ORDER — ALTEPLASE 2 MG IJ SOLR: 2.0000 mg | Freq: Once | INTRAMUSCULAR | Status: DC | PRN

## 2024-03-12 MED ORDER — HEPARIN SODIUM (PORCINE) 1000 UNIT/ML DIALYSIS: 1000.0000 [IU] | INTRAMUSCULAR | Status: DC | PRN

## 2024-03-12 MED ORDER — MIDODRINE HCL 5 MG PO TABS
ORAL_TABLET | ORAL | Status: AC
  Filled 2024-03-12: qty 1

## 2024-03-12 MED ORDER — ANTICOAGULANT SODIUM CITRATE 4% (200MG/5ML) IV SOLN
5.0000 mL | Status: DC | PRN
  Filled 2024-03-12: qty 5

## 2024-03-12 MED ORDER — VANCOMYCIN HCL 750 MG/150ML IV SOLN
750.0000 mg | Freq: Once | INTRAVENOUS | Status: AC
  Administered 2024-03-12: 750 mg via INTRAVENOUS
  Filled 2024-03-12 (×2): qty 150

## 2024-03-12 MED ORDER — HEPARIN SODIUM (PORCINE) 1000 UNIT/ML IJ SOLN
INTRAMUSCULAR | Status: AC
  Filled 2024-03-12: qty 4

## 2024-03-12 NOTE — Progress Notes (Signed)
 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 4/21>> x-ray left wrist: No fracture 4/21>> x-ray left shoulder, no fracture. 4/21>> CT head: No acute intracranial abnormality 4/21>> CT C-spine: No fracture. 4/21>> CT abdomen/pelvis: No acute injury to chest/abdomen/pelvis 4/21>> CT T-spine: Prior posterior fusion from T10-T12-abnormal appearance with collapse of T10-lucency around T10 screws at the T9-10 disc space, T10-11 disc space concerning for discitis/osteomyelitis. 4/21>> CT L-spine: No acute bony abnormality, mild chronic compression fracture at L1. 4/22>> x-ray left foot: Chronic erosive changes about the forefoot similar to 11/21/2023-osteomyelitis/septic arthritis of the 1st/2nd MTP joints difficult to exclude. 4/25>> left ABI: Within normal limit.   Significant microbiology data: 4/22>> blood culture: No growth   Procedures: 4/30>> left transmetatarsal amputation by Dr. Julio Ohm.  He decompensated on 5/22 was sent to ICU because of hypotension?  Received pain meds systolic blood pressures in 50s and 60s placed on Levophed -he  was transferred back out to the floor the day after and we resumed care  Plan  Thoracic osteomyelitis discitis after fall 4/20-T9-T10 discitis-Nonoperative candidate Osteomyelitis left foot first second metatarsal phalangeal joint on 4/30 with recent completion of Dapto cefepime  on 3/28 IV cefepime  vancomycin  complestes on 6/4 with dialysis Continue Oxy IR 10 every 6 as needed moderate pain, OxyContin  30 every 8 for severe pain, Zanaflex  4 every 8 as needed-limit IV opiates--we will stop Iv dialudid pre-HD as there is no indication for this ouchdown weightbearing on the left leg  Hypotension -etiology unclear ESRD-hyperkalemia seems resolved after checks Hypotension possibly secondary to trying to challenge EDW transiently on pressors BP improved-on midodrine  now at 5 3 times daily down from prior dosing a Await anemia panel ?-might need ESA-continue Renvela  1.6 3 times daily  BPH severe hydroureteronephrosis Continue Flomax  0.4, bethanechol  10 twice daily  Left transmetatarsal amputation for osteomyelitis/30 Pain meds as above  Previous history of DVT PE with recurrence in 2023 Previously on Coumadin  now off the same for unclear reason apparently has IVC filter in place He does not recall why he was taken off of this We will resume and place on a doac as very high risk of immobility and recurrence  Chronic HFpEF Now managed with dialysis-BP meds as above  DVT prophylaxis: SCD  Status is: Inpatient Remains inpatient appropriate because:   Awaiting stability for placement   Subjective:  Was refusing HD yesterday.  Seen at hd today Refusing any other SNF other than [facility] "[facility] OR ABOVE" HAVE EXPLAINED IF HE CONTINUES TO REFUSE WE WILL D/C HIM HOME AND DISCUSSED THE RISKS OF THIS WITH HIM   Objective + exam Vitals:   03/12/24 1208 03/12/24 1238 03/12/24 1255 03/12/24 1340  BP: 132/76 121/83 [name])  143/84 (!) 130/98  Pulse: 81 77 85 70  Resp: 13 16 16 18   Temp:   98 F (36.7  C) 98.1 F (36.7 C)  TempSrc:      SpO2: 100% 100% 100% 98%  Weight:   90.2 kg   Height:       Filed Weights   03/12/24 0516 03/12/24 0929 03/12/24 1255  Weight: 88.8 kg 90.4 kg 90.2 kg    Examination: EOMI NCAT no focal deficit no icterus no pallor Chest clear Neck soft supple Left TMA looks clean Right BKA looks well-healed  Data Reviewed: reviewed   CBC    Component Value Date/Time   WBC 9.2 03/12/2024 0936   RBC 2.82 (L) 03/12/2024 0941   RBC 2.84 (L) 03/12/2024 0936   HGB 9.8 (L) 03/12/2024 0936   HCT 31.0 (L) 03/12/2024 0936   PLT 144 (L) 03/12/2024 0936   MCV 109.2 (H) 03/12/2024 0936   MCH 34.5 (H) 03/12/2024 0936   MCHC 31.6 03/12/2024 0936   RDW 15.4 03/12/2024 0936   LYMPHSABS 0.8 03/12/2024 0936   MONOABS 0.7 03/12/2024 0936   EOSABS 0.4 03/12/2024 0936   BASOSABS 0.0 03/12/2024 0936      Latest Ref Rng & Units 03/12/2024    6:33 AM 03/11/2024    9:36 AM 03/10/2024    2:33 AM  CMP  Glucose 70 - 99 mg/dL 96  478  295   BUN 8 - 23 mg/dL 50  41  26   Creatinine 0.61 - 1.24 mg/dL 6.21  3.08  6.57   Sodium 135 - 145 mmol/L 138  137  135   Potassium 3.5 - 5.1 mmol/L 4.6  5.1  4.2   Chloride 98 - 111 mmol/L 97  99  97   CO2 22 - 32 mmol/L 20  23  26    Calcium  8.9 - 10.3 mg/dL 8.8  8.8  8.6   Total Protein 6.5 - 8.1 g/dL  8.1    Total Bilirubin 0.0 - 1.2 mg/dL  0.5    Alkaline Phos 38 - 126 U/L  49    AST 15 - 41 U/L  19    ALT 0 - 44 U/L  11      Scheduled Meds:  vitamin C   1,000 mg Oral Daily   atorvastatin   80 mg Oral Daily   bethanechol   10 mg Oral BID   Chlorhexidine  Gluconate Cloth  6 each Topical Q0600   darbepoetin (ARANESP ) injection - DIALYSIS  100 mcg Subcutaneous Q Sat-1800   heparin  injection (subcutaneous)  5,000 Units Subcutaneous Q8H   lactulose   30 g Oral Daily   levothyroxine   50 mcg Oral QAC breakfast   midodrine   5 mg Oral TID WC   multivitamin  1 tablet Oral QHS   naloxegol  oxalate  12.5 mg Oral Daily   nutrition  supplement (JUVEN)  1 packet Oral BID BM   oxyCODONE   30 mg Oral Q8H   polyethylene glycol  17 g Oral BID   senna-docusate  4 tablet Oral BID   sevelamer  carbonate  1,600 mg Oral TID WC   tamsulosin   0.4 mg Oral Daily   Continuous Infusions:  sodium chloride  10 mL/hr at 03/10/24 8469   ceFEPime  (MAXIPIME ) IV 2 g (03/11/24 1218)   promethazine  (PHENERGAN ) injection (IM or IVPB) Stopped (03/12/24 0725)   vancomycin       Time 25  Thomas Glisson, MD  [facility]

## 2024-03-12 NOTE — Progress Notes (Signed)
   03/12/24 1255  Vitals  Temp 98 F (36.7 C)  Pulse Rate 85  Resp 16  BP (!) 143/84  SpO2 100 %  O2 Device Room Air  Weight 90.2 kg  Type of Weight (S)  Post-Dialysis (Bed Scale)  Oxygen Therapy  Patient Activity (if Appropriate) In bed  Pulse Oximetry Type Continuous  Post Treatment  Dialyzer Clearance Clear  Hemodialysis Intake (mL) 0 mL  Liters Processed 72  Fluid Removed (mL) 0 mL  Tolerated HD Treatment Yes  Post-Hemodialysis Comments Pt. voice no complaints and report call to 51M bedside RN. Admin Medications per order.   Received patient in bed to unit.  Alert and oriented.  Informed consent signed and in chart.   TX duration: 3  Patient tolerated well.  Transported back to the room  Alert, without acute distress.  Hand-off given to patient's nurse.   Access used: Yes Access issues: No   Total UF removed: 0 Medication(s) given: See MAR Post HD VS: See Above Grid Post HD weight: 90.2 kg   [facility]

## 2024-03-12 NOTE — Progress Notes (Signed)
 [facility] Progress Note   Subjective:    Spoke with the patient yesterday afternoon and recommended adherence with HD treatments.  I also recommended SNF to reduce the risk of wound not healing and/or potential limb loss.      Seen and examined on dialysis.  Blood pressure 124/74 and HR 86.  Tolerating goal.  Procedure supervised.  RIJ tunneled HD catheter    Objective Vitals:   03/12/24 0931 03/12/24 1007 03/12/24 1035 03/12/24 1107  BP: (!) 140/79 124/74 109/77 113/77  Pulse: 82 83 85 84  Resp: 14 16 10 14   Temp: 97.8 F (36.6 C)     TempSrc:      SpO2: 100% 100% 99% 100%  Weight:      Height:       Physical Exam    General adult male in bed in no acute distress HEENT normocephalic atraumatic extraocular movements intact sclera anicteric Neck supple trachea midline Lungs clear to auscultation bilaterally normal work of breathing at rest on room air  Heart S1S2 no rub Abdomen soft nontender nondistended Extremities no edema appreciated; right BKA  left TMA  Psych normal mood and affect Access RIJ tunneled dialysis catheter     Additional Objective Labs: Basic Metabolic Panel: Recent Labs  Lab 03/08/24 0436 03/09/24 1637 03/10/24 0233 03/11/24 0936 03/12/24 0633  NA 138   < > 135 137 138  K 4.8   < > 4.2 5.1 4.6  CL 98   < > 97* 99 97*  CO2 20*   < > 26 23 20*  GLUCOSE 100*   < > 114* 151* 96  BUN 64*   < > 26* 41* 50*  CREATININE 9.95*   < > 5.77* 8.44* 9.98*  CALCIUM  8.6*   < > 8.6* 8.8* 8.8*  PHOS 7.8*  --   --   --  6.8*   < > = values in this interval not displayed.   Liver Function Tests: Recent Labs  Lab 03/08/24 0436 03/11/24 0936 03/12/24 0633  AST  --  19  --   ALT  --  11  --   ALKPHOS  --  49  --   BILITOT  --  0.5  --   PROT  --  8.1  --   ALBUMIN  3.5 3.5 3.6   No results for input(s): "LIPASE", "AMYLASE" in the last 168 hours. CBC: Recent Labs  Lab 03/08/24 0436 03/09/24 1637 03/10/24 0233 03/11/24 0936  03/12/24 0936  WBC 9.2 5.0 8.1 7.3 9.2  NEUTROABS  --  2.8  --  5.4 7.2  HGB 11.0* 9.8* 9.3* 10.2* 9.8*  HCT 33.9* 31.7* 28.8* 32.4* 31.0*  MCV 107.6* 110.5* 109.1* 110.6* 109.2*  PLT 169 137* 146* 135* 144*   Blood Culture    Component Value Date/Time   SDES BLOOD BLOOD RIGHT HAND AEROBIC BOTTLE ONLY 02/09/2024 0414   SPECREQUEST  02/09/2024 0414    BOTTLES DRAWN AEROBIC ONLY Blood Culture adequate volume   CULT  02/09/2024 0414    NO GROWTH 5 DAYS Performed at [facility], [address] [place], [place] [address]    REPTSTATUS 02/14/2024 FINAL 02/09/2024 0414    Cardiac Enzymes: No results for input(s): "CKTOTAL", "CKMB", "CKMBINDEX", "TROPONINI" in the last 168 hours. CBG: Recent Labs  Lab 03/10/24 2048 03/11/24 0820 03/11/24 1135 03/11/24 1646 03/12/24 0731  GLUCAP 116* 102* 73 103* 82   Iron  Studies: No results for input(s): "IRON ", "TIBC", "TRANSFERRIN", "FERRITIN" in  the last 72 hours. @lablastinr3 @ Studies/Results: No results found.   Medications:  sodium chloride  10 mL/hr at 03/10/24 1610   anticoagulant sodium citrate      anticoagulant sodium citrate      ceFEPime  (MAXIPIME ) IV 2 g (03/11/24 1218)   promethazine  (PHENERGAN ) injection (IM or IVPB) Stopped (03/12/24 0725)   vancomycin       vitamin C   1,000 mg Oral Daily   atorvastatin   80 mg Oral Daily   bethanechol   10 mg Oral BID   Chlorhexidine  Gluconate Cloth  6 each Topical Q0600   darbepoetin (ARANESP ) injection - DIALYSIS  100 mcg Subcutaneous Q Sat-1800   heparin  injection (subcutaneous)  5,000 Units Subcutaneous Q8H   lactulose   30 g Oral Daily   levothyroxine   50 mcg Oral QAC breakfast   midodrine   5 mg Oral TID WC   multivitamin  1 tablet Oral QHS   naloxegol  oxalate  12.5 mg Oral Daily   nutrition supplement (JUVEN)  1 packet Oral BID BM   oxyCODONE   30 mg Oral Q8H   polyethylene glycol  17 g Oral BID   senna-docusate  4 tablet Oral BID   sevelamer  carbonate  1,600 mg Oral TID  WC   tamsulosin   0.4 mg Oral Daily    Dialysis Orders: TTS [place] previously (not he will be going to [place]) 4h  B400  92kg  TDC  Heparin  none - Mircera 225mcg IV q 2 weeks (last 4/9) - Calcitriol  0.64mcg PO q HD    Assessment/Plan: 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 CT imaging showed T9-10 discitis. He refused MRI, area was too small for aspiration per IR. H/o of foot infection as below. Concerned that this infection may have seeded the spine. Blood Cxs were negative. Not an operative candidate per neurosurgery.   Per ID continue IV Vanc 750mg  and IV Cefepime  2gm with HD x 6 weeks thru 03/23/24 for the discitis. Note that his HD schedule has changed to MWF to align with new outpatient HD schedule Left foot osteomyelitis - completed an 8 week course of Dapto/Cefepime  on 3/28 for bacteremia presumed due to foot infection. S/p left TMA on 4/30. Antibiotics as above.  Hyperkalemia - continue renal diet.improved  ESRD:   Transitioned to MWF schedule for HD to align with his outpatient schedule (note he will now be MWF [place] given the SNF plans) Hypotension: We have been doing little to no UF with HD.  He was hypertensive on midodrine  10 mg TID so we reduced midodrine  to 5 mg TID  Anemia of CKD: Continue aranesp  100 mcg every Saturday. No venofer in setting of infection Metabolic bone disease: hyperphos - continue home binders and encourage HD adherence Nutrition: prot supp for low albumin    Disposition - per primary team. He has been recommended for SNF placement  Nan Aver, MD 03/12/2024  11:50 AM

## 2024-03-13 DIAGNOSIS — M4624 Osteomyelitis of vertebra, thoracic region: Secondary | ICD-10-CM | POA: Diagnosis not present

## 2024-03-13 LAB — GLUCOSE, CAPILLARY
Glucose-Capillary: 77 mg/dL (ref 70–99)
Glucose-Capillary: 144 mg/dL — ABNORMAL HIGH (ref 70–99)

## 2024-03-13 MED ORDER — CHLORHEXIDINE GLUCONATE CLOTH 2 % EX PADS
6.0000 | MEDICATED_PAD | Freq: Every day | CUTANEOUS | Status: DC
  Administered 2024-03-14 – 2024-03-16 (×3): 6 via TOPICAL

## 2024-03-13 NOTE — Progress Notes (Signed)
 [facility] Progress Note   Subjective:    Last HD on 5/24* with no UF (was kept even).  (Tx on 5/24 off schedule due to patient refusing HD on 5/23, his regular day).  He has been sweating and asks for another gown to change into    Review of systems:  Denies shortness of breath or chest pain  Reports some nausea; no vomiting    Objective Vitals:   03/12/24 1655 03/12/24 2111 03/13/24 0419 03/13/24 0943  BP: (!) 88/57 113/69 (!) 142/82 132/78  Pulse: 91 79 68 83  Resp: 18  14 14   Temp: 98.3 F (36.8 C) 99.1 F (37.3 C) 98.1 F (36.7 C) 98 F (36.7 C)  TempSrc:  Oral Oral Oral  SpO2: 100% 99% 100% 96%  Weight:   89.6 kg   Height:       Physical Exam     General adult male in bed in no acute distress HEENT normocephalic atraumatic extraocular movements intact sclera anicteric Neck supple trachea midline Lungs clear to auscultation bilaterally normal work of breathing at rest on room air  Heart S1S2 no rub Abdomen soft nontender nondistended Extremities no edema appreciated; right BKA  left TMA  Psych normal mood and affect Neuro alert and oriented x 3; provides hx and follows commands  Access RIJ tunneled dialysis catheter     Additional Objective Labs: Basic Metabolic Panel: Recent Labs  Lab 03/08/24 0436 03/09/24 1637 03/10/24 0233 03/11/24 0936 03/12/24 0633  NA 138   < > 135 137 138  K 4.8   < > 4.2 5.1 4.6  CL 98   < > 97* 99 97*  CO2 20*   < > 26 23 20*  GLUCOSE 100*   < > 114* 151* 96  BUN 64*   < > 26* 41* 50*  CREATININE 9.95*   < > 5.77* 8.44* 9.98*  CALCIUM  8.6*   < > 8.6* 8.8* 8.8*  PHOS 7.8*  --   --   --  6.8*   < > = values in this interval not displayed.   Liver Function Tests: Recent Labs  Lab 03/08/24 0436 03/11/24 0936 03/12/24 0633  AST  --  19  --   ALT  --  11  --   ALKPHOS  --  49  --   BILITOT  --  0.5  --   PROT  --  8.1  --   ALBUMIN  3.5 3.5 3.6   No results for input(s): "LIPASE", "AMYLASE" in the  last 168 hours. CBC: Recent Labs  Lab 03/08/24 0436 03/09/24 1637 03/10/24 0233 03/11/24 0936 03/12/24 0936  WBC 9.2 5.0 8.1 7.3 9.2  NEUTROABS  --  2.8  --  5.4 7.2  HGB 11.0* 9.8* 9.3* 10.2* 9.8*  HCT 33.9* 31.7* 28.8* 32.4* 31.0*  MCV 107.6* 110.5* 109.1* 110.6* 109.2*  PLT 169 137* 146* 135* 144*   Blood Culture    Component Value Date/Time   SDES BLOOD BLOOD RIGHT HAND AEROBIC BOTTLE ONLY 02/09/2024 0414   SPECREQUEST  02/09/2024 0414    BOTTLES DRAWN AEROBIC ONLY Blood Culture adequate volume   CULT  02/09/2024 0414    NO GROWTH 5 DAYS Performed at [facility], [address], [place], [place] [address]    REPTSTATUS 02/14/2024 FINAL 02/09/2024 0414    Cardiac Enzymes: No results for input(s): "CKTOTAL", "CKMB", "CKMBINDEX", "TROPONINI" in the last 168 hours. CBG: Recent Labs  Lab 03/11/24 1135 03/11/24 1646  03/12/24 0731 03/12/24 1641 03/13/24 0752  GLUCAP 73 103* 82 141* 77   Iron  Studies:  Recent Labs    03/12/24 1508  IRON  28*  TIBC 185*  FERRITIN 424*   @lablastinr3 @ Studies/Results: No results found.   Medications:  sodium chloride  10 mL/hr at 03/10/24 1610   ceFEPime  (MAXIPIME ) IV 2 g (03/11/24 1218)   promethazine  (PHENERGAN ) injection (IM or IVPB) 12.5 mg (03/13/24 0510)   vancomycin       vitamin C   1,000 mg Oral Daily   atorvastatin   80 mg Oral Daily   bethanechol   10 mg Oral BID   Chlorhexidine  Gluconate Cloth  6 each Topical Q0600   darbepoetin (ARANESP ) injection - DIALYSIS  100 mcg Subcutaneous Q Sat-1800   heparin  injection (subcutaneous)  5,000 Units Subcutaneous Q8H   lactulose   30 g Oral Daily   levothyroxine   50 mcg Oral QAC breakfast   midodrine   5 mg Oral TID WC   multivitamin  1 tablet Oral QHS   naloxegol  oxalate  12.5 mg Oral Daily   nutrition supplement (JUVEN)  1 packet Oral BID BM   oxyCODONE   30 mg Oral Q8H   polyethylene glycol  17 g Oral BID   senna-docusate  4 tablet Oral BID   sevelamer  carbonate   1,600 mg Oral TID WC   tamsulosin   0.4 mg Oral Daily    Dialysis Orders: TTS [place] previously (not he will be going to [place]) 4h  B400  92kg  TDC  Heparin  none - Mircera 225mcg IV q 2 weeks (last 4/9) - Calcitriol  0.79mcg PO q HD    Assessment/Plan: 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 CT imaging showed T9-10 discitis. He refused MRI, area was too small for aspiration per IR. H/o of foot infection as below. Concerned that this infection may have seeded the spine. Blood Cxs were negative. Not an operative candidate per neurosurgery.   Per ID continue IV Vanc 750mg  and IV Cefepime  2gm with HD x 6 weeks thru 03/23/24 for the discitis. Note that his HD schedule has changed to MWF to align with new outpatient HD schedule Left foot osteomyelitis - completed an 8 week course of Dapto/Cefepime  on 3/28 for bacteremia presumed due to foot infection. S/p left TMA on 4/30. Antibiotics as above.  Hyperkalemia - continue renal diet.improved  ESRD:   Transitioned to MWF schedule for HD to align with his outpatient schedule (note he will now be MWF [place] given the SNF plans) Renal panel in AM Hypotension: started on midodrine  this admission due to hypotension.  He has a history of HTN and is off of multiple anti-hypertensives.  We have been doing little to no UF with HD.  He was hypertensive on midodrine  10 mg TID so we reduced midodrine  to 5 mg TID  Anemia of CKD: Continue aranesp  100 mcg every Saturday. No venofer in setting of infection Metabolic bone disease: hyperphos - continue home binders and encourage HD adherence Nutrition: prot supp for low albumin    Disposition - per primary team. He has been recommended for SNF placement.   Nan Aver, MD 03/13/2024  12:47 PM

## 2024-03-13 NOTE — Progress Notes (Signed)
 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 4/21>> x-ray left wrist: No fracture 4/21>> x-ray left shoulder, no fracture. 4/21>> CT head: No acute intracranial abnormality 4/21>> CT C-spine: No fracture. 4/21>> CT abdomen/pelvis: No acute injury to chest/abdomen/pelvis 4/21>> CT T-spine: Prior posterior fusion from T10-T12-abnormal appearance with collapse of T10-lucency around T10 screws at the T9-10 disc space, T10-11 disc space concerning for discitis/osteomyelitis. 4/21>> CT L-spine: No acute bony abnormality, mild chronic compression fracture at L1. 4/22>> x-ray left foot: Chronic erosive changes about the forefoot similar to 11/21/2023-osteomyelitis/septic arthritis of the 1st/2nd MTP joints difficult to exclude. 4/25>> left ABI: Within normal limit.   Significant microbiology data: 4/22>> blood culture: No growth   Procedures: 4/30>> left transmetatarsal amputation by Dr. Julio Ohm.  He decompensated on 5/22 was sent to ICU because of hypotension?  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-T9-T10 discitis-Nonoperative candidate Osteomyelitis left foot first second metatarsal phalangeal joint on 4/30-- recent completion of Dapto cefepime  on 3/28 IV cefepime  vancomycin  completes on 6/4 with dialysis Continue Oxy IR 10 every 6 as needed moderate pain, OxyContin  30 every 8 for severe pain, Zanaflex  4 every 8 as needed-limit IV opiates--we will stop Iv dialudid pre-HD as there is no indication for this touchdown weightbearing on the left leg per ortho  Hypotension -etiology unclear ESRD-hyperkalemia seems resolved after checks Hypotension possibly secondary to trying to challenge EDW transiently on pressors BP improved-on midodrine  now at 5 3 times daily down from prior dosing a Iron  28/Sat 15---cannot give Iron  so defer ESA to renal  BPH severe hydroureteronephrosis Continue Flomax  0.4, bethanechol  10 twice daily  Left transmetatarsal amputation for osteomyelitis/30 Pain meds as above  Previous history of DVT PE with recurrence in 2023 Previously on Coumadin  now off the same for unclear reason apparently has IVC filter in place He does not recall why he was taken off of this We will resume and place on a doac as very high risk of immobility and recurrence and discuss this as OP  Chronic HFpEF Now managed with dialysis-BP meds as above  DVT prophylaxis: SCD  Status is: Inpatient Remains inpatient appropriate because:   Awaiting stability for placement   Subjective:  Doing fair no fever no chills On bed-pan No there c/o   Objective + exam Vitals:   03/12/24 1655 03/12/24 2111 03/13/24 0419 03/13/24 0943  BP: (!) 88/57 113/69 (!) 142/82 132/78  Pulse: 91 79 68 83  Resp: 18  14 14   Temp: 98.3 F (36.8 C) 99.1 F (  37.3 C) 98.1 F (36.7 C) 98 F (36.7 C)  TempSrc:  Oral Oral Oral  SpO2: 100% 99% 100% 96%  Weight:   89.6 kg    Height:       Filed Weights   03/12/24 0929 03/12/24 1255 03/13/24 0419  Weight: 90.4 kg 90.2 kg 89.6 kg    Examination:  Eoi ncat no focal deficit Cta b abd soft nt nd no rebound no guard TMA looks about the same on R side  Data Reviewed: reviewed   CBC    Component Value Date/Time   WBC 9.2 03/12/2024 0936   RBC 2.82 (L) 03/12/2024 0941   RBC 2.84 (L) 03/12/2024 0936   HGB 9.8 (L) 03/12/2024 0936   HCT 31.0 (L) 03/12/2024 0936   PLT 144 (L) 03/12/2024 0936   MCV 109.2 (H) 03/12/2024 0936   MCH 34.5 (H) 03/12/2024 0936   MCHC 31.6 03/12/2024 0936   RDW 15.4 03/12/2024 0936   LYMPHSABS 0.8 03/12/2024 0936   MONOABS 0.7 03/12/2024 0936   EOSABS 0.4 03/12/2024 0936   BASOSABS 0.0 03/12/2024 0936      Latest Ref Rng & Units 03/12/2024    6:33 AM 03/11/2024    9:36 AM 03/10/2024    2:33 AM  CMP  Glucose 70 - 99 mg/dL 96  161  096   BUN 8 - 23 mg/dL 50  41  26   Creatinine 0.61 - 1.24 mg/dL 0.45  4.09  8.11   Sodium 135 - 145 mmol/L 138  137  135   Potassium 3.5 - 5.1 mmol/L 4.6  5.1  4.2   Chloride 98 - 111 mmol/L 97  99  97   CO2 22 - 32 mmol/L 20  23  26    Calcium  8.9 - 10.3 mg/dL 8.8  8.8  8.6   Total Protein 6.5 - 8.1 g/dL  8.1    Total Bilirubin 0.0 - 1.2 mg/dL  0.5    Alkaline Phos 38 - 126 U/L  49    AST 15 - 41 U/L  19    ALT 0 - 44 U/L  11      Scheduled Meds:  vitamin C   1,000 mg Oral Daily   atorvastatin   80 mg Oral Daily   bethanechol   10 mg Oral BID   Chlorhexidine  Gluconate Cloth  6 each Topical Q0600   darbepoetin (ARANESP ) injection - DIALYSIS  100 mcg Subcutaneous Q Sat-1800   heparin  injection (subcutaneous)  5,000 Units Subcutaneous Q8H   lactulose   30 g Oral Daily   levothyroxine   50 mcg Oral QAC breakfast   midodrine   5 mg Oral TID WC   multivitamin  1 tablet Oral QHS   naloxegol  oxalate  12.5 mg Oral Daily   nutrition supplement (JUVEN)  1 packet Oral BID BM   oxyCODONE   30 mg Oral Q8H   polyethylene glycol  17 g Oral BID    senna-docusate  4 tablet Oral BID   sevelamer  carbonate  1,600 mg Oral TID WC   tamsulosin   0.4 mg Oral Daily   Continuous Infusions:  sodium chloride  10 mL/hr at 03/10/24 9147   ceFEPime  (MAXIPIME ) IV 2 g (03/11/24 1218)   promethazine  (PHENERGAN ) injection (IM or IVPB) Stopped (03/13/24 1321)   vancomycin       Time 25  Verlie Glisson, MD  [facility]

## 2024-03-14 DIAGNOSIS — M4624 Osteomyelitis of vertebra, thoracic region: Secondary | ICD-10-CM | POA: Diagnosis not present

## 2024-03-14 LAB — GLUCOSE, CAPILLARY
Glucose-Capillary: 89 mg/dL (ref 70–99)
Glucose-Capillary: 141 mg/dL — ABNORMAL HIGH (ref 70–99)
Glucose-Capillary: 101 mg/dL — ABNORMAL HIGH (ref 70–99)
Glucose-Capillary: 121 mg/dL — ABNORMAL HIGH (ref 70–99)

## 2024-03-14 LAB — RENAL FUNCTION PANEL
Albumin: 3.3 g/dL — ABNORMAL LOW (ref 3.5–5.0)
Anion gap: 15 (ref 5–15)
BUN: 47 mg/dL — ABNORMAL HIGH (ref 8–23)
CO2: 21 mmol/L — ABNORMAL LOW (ref 22–32)
Calcium: 9 mg/dL (ref 8.9–10.3)
Chloride: 102 mmol/L (ref 98–111)
Creatinine, Ser: 9.17 mg/dL — ABNORMAL HIGH (ref 0.61–1.24)
GFR, Estimated: 6 mL/min — ABNORMAL LOW (ref >60)
Glucose, Bld: 98 mg/dL (ref 70–99)
Phosphorus: 6.3 mg/dL — ABNORMAL HIGH (ref 2.5–4.6)
Potassium: 5.1 mmol/L (ref 3.5–5.1)
Sodium: 138 mmol/L (ref 135–145)

## 2024-03-14 MED ORDER — APIXABAN 5 MG PO TABS
5.0000 mg | ORAL_TABLET | Freq: Two times a day (BID) | ORAL | Status: DC
Start: 2024-03-14 — End: 2024-03-17
  Administered 2024-03-14 – 2024-03-16 (×5): 5 mg via ORAL
  Filled 2024-03-14 (×5): qty 1

## 2024-03-14 NOTE — Progress Notes (Signed)
 Physical Therapy Treatment  Patient Details Name: Thomas [name] MRN: [id number] DOB: 1951/06/11 Today's Date: 03/14/2024   History of Present Illness The pt is a 73 yo male presenting 4/20 with back pain after a fall at home. Work up concerning for T9-11 discitis/osteomyelitis and L foot osteomyelitis. 4/30 L transmetatarsal amputation. 5/22 transfer [facility] for persistent hypotension. 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 Pt declining OOB mobility attempts this session and requests to stay in the bed. Pt reports he is "soaking wet" and it appears pt was sweating. Noted pillows, sheets, and bed pads were wet. Pt washed up in the bed and was assisted in changing gown and linen. Pt would transition to long sitting but refused to sit EOB. Pt motivated to perform therapeutic exercise, demonstrating almost full provided HEP without difficulty. PT added a few exercises to pt's Medbridge program and updated in chart. Pt currently using green theraband and not red. Cautioned pt in doing "sit ups" to protect his back, however pt insistent and states they do not hurt his back at this time. Will continue to follow.    If plan is discharge home, recommend the following: Two people to help with walking and/or transfers;Two people to help with bathing/dressing/bathroom;Assistance with cooking/housework;Assistance with feeding;Direct supervision/assist for medications management;Direct supervision/assist for financial management;Assist for transportation;Help with stairs or ramp for entrance   Can travel by private vehicle     No  Equipment Recommendations  Hospital bed [facility]- but pt states it will not fit in home)    Recommendations for Other Services        Precautions / Restrictions Precautions Precautions: Back;Fall Precaution Booklet Issued: Yes (comment) Recall of Precautions/Restrictions: Impaired Precaution/Restrictions Comments: Pt does not adhere to back precautions, even with education. Required Braces or Orthoses: Spinal Brace Spinal Brace: Thoracolumbosacral orthotic;Applied in sitting position (refused to don) Other Brace: Post op shoe Restrictions Weight Bearing Restrictions Per Provider Order: Yes LLE Weight Bearing Per Provider Order: Touchdown weight bearing Other Position/Activity Restrictions: Post op shoe     Mobility  Bed Mobility Overal bed mobility: Needs Assistance Bed Mobility: Rolling, Supine to Sit Rolling: Modified independent (Device/Increase time)   Supine to sit: HOB elevated, Used rails, Supervision     General bed mobility comments: Pt refuses to sit EOB, however mobile with bed mobility to allow for therapist to change wet linen (from sweat). Pt transitioned into long sitting and did a chair push up on bed rails to lift hips and clear bed for pads to be changed. Pt rolled L without assist, and participated in washing his face, trunk, legs, and between his legs with washcloth. Therapist assisted in washing back off.    Transfers                   General transfer comment: Pt refused OOB mobility at this time.    Ambulation/Gait                   Stairs             Wheelchair Mobility     Tilt Bed    Modified Rankin (Stroke Patients Only)       Balance Overall balance assessment: History of Falls, Needs assistance Sitting-balance support: No  upper extremity supported Sitting balance-[name]: Fair Sitting balance - Comments: Sits unsupported in long sitting position                                    Communication Communication Communication: No apparent difficulties Factors Affecting Communication: Reduced clarity of speech  Cognition  Arousal: Alert Behavior During Therapy: WFL for tasks assessed/performed   PT - Cognitive impairments: No family/caregiver present to determine baseline                         Following commands: Intact      Cueing Cueing Techniques: Verbal cues, Gestural cues  Exercises Other Exercises Other Exercises: LE exercise without band, UE exercise with green theraband: Hip Abd/adduction, Chair Push Ups in bed using rails,    Straight Leg Raise, Supine Shoulder Horizontal Abduction with Resistance,    PNF D2 Flexion with Resistance, Elbow Flexion with Anchored Resistance, supine Double Knee to Chest active, Heel Slides    General Comments        Pertinent Vitals/Pain Pain Assessment Pain Assessment: 0-10 Pain Score: 8  Pain Location: back Pain Descriptors / Indicators: Grimacing, Guarding Pain Intervention(s): Limited activity within patient's tolerance, Monitored during session, Repositioned, Patient requesting pain meds-RN notified, RN gave pain meds during session    Home Living                          Prior Function            PT Goals (current goals can now be found in the care plan section) Acute Rehab PT Goals Patient Stated Goal: to reduce pain PT Goal Formulation: With patient Time For Goal Achievement: 03/21/24 Potential to Achieve Goals: Fair Progress towards PT goals: Progressing toward goals    Frequency    Min 2X/week      PT Plan      Co-evaluation              AM-PAC PT "6 Clicks" Mobility   Outcome Measure  Help needed turning from your back to your side while in a flat bed without using bedrails?: A Little Help needed moving from lying on your back to sitting on the side of a flat bed without using bedrails?: A Little Help needed moving to and from a bed to a chair (including a wheelchair)?: A Little Help needed standing up from a chair using your arms (e.g., wheelchair or bedside chair)?: Total Help needed to walk in  hospital room?: Total Help needed climbing 3-5 steps with a railing? : Total 6 Click Score: 12    End of Session Equipment Utilized During Treatment: Gait belt Activity Tolerance: Patient limited by pain Patient left: in bed;with call bell/phone within reach Nurse Communication: Mobility status PT Visit Diagnosis: Unsteadiness on feet (R26.81);Other abnormalities of gait and mobility (R26.89);Muscle weakness (generalized) (M62.81);Pain;Difficulty in walking, not elsewhere classified (R26.2);History of falling (Z91.81) Pain - part of body:  (Back)     Time: 1610-9604 PT Time Calculation (min) (ACUTE ONLY): 36 min  Charges:    $Therapeutic Exercise: 8-22 mins $Therapeutic Activity: 8-22 mins PT General Charges $$ ACUTE PT VISIT: 1 Visit                     Simone Dubois, PT, DPT Acute Rehabilitation Services Secure Chat Preferred Office: [phone]  Venus Ginsberg 03/14/2024, 3:11 PM

## 2024-03-14 NOTE — TOC Progression Note (Signed)
 Transition of Care [facility]) - Progression Note    Patient Details  Name: Thomas [name] MRN: [id number] Date of Birth: 15-Jul-1951  Transition of Care [facility]) CM/SW Contact  [name], Josi Roediger Daphne, RN Phone Number: 03/14/2024, 3:01 PM  Clinical Narrative:     CM notified that patient declined going to SNF and requesting to go home. CM spoke with patient at bedside and has no preference. CM called in referral to [facility] and Loetta Ringer voiced acceptance, info on AVS.   CM will continue to follow.        Expected Discharge Plan: Skilled Nursing Facility Barriers to Discharge: Continued Medical Work up, English as a second language teacher (OP HD chair)  Expected Discharge Plan and Services     Post Acute Care Choice: Skilled Nursing Facility Living arrangements for the past 2 months: Single Family Home                                       Social Determinants of Health (SDOH) Interventions SDOH Screenings   Food Insecurity: No Food Insecurity (02/09/2024)  Housing: Low Risk  (02/09/2024)  Transportation Needs: No Transportation Needs (02/09/2024)  Utilities: Not At Risk (02/09/2024)  Depression (PHQ2-9): Low Risk  (02/08/2020)  Social Connections: Socially Isolated (02/11/2024)  Tobacco Use: Low Risk  (02/07/2024)    Readmission Risk Interventions    01/15/2022    2:56 PM 01/02/2022    2:03 PM 12/31/2021   10:32 AM  Readmission Risk Prevention Plan  Transportation Screening Complete Complete Complete  Medication Review Oceanographer) Complete Complete Complete  PCP or Specialist appointment within 3-5 days of discharge Complete Complete Complete  HRI or Home Care Consult Complete Complete Complete  SW Recovery Care/Counseling Consult Complete Complete Complete  Palliative Care Screening Not Applicable Not Applicable Not Applicable  Skilled Nursing Facility Not Applicable Not Applicable Not Applicable

## 2024-03-14 NOTE — Progress Notes (Signed)
 Orthopedic Tech Progress Note Patient Details:  Thomas [name] [birth year] [id number]  Patient has POST OP SHOE,   Patient ID: Thomas [name], male   DOB: [birth year], 72 y.o.   MRN: [id number]  [name] 03/14/2024, 2:02 PM

## 2024-03-14 NOTE — Progress Notes (Signed)
 [name] Progress Note   Subjective:   Pt seen in room. Reports he is SOB when he lays flat but also states he does not want fluid taken of with HD. Denies CP, palpitations, dizziness. Reports cramps in his back.  Objective Vitals:   03/13/24 2012 03/14/24 0447 03/14/24 0500 03/14/24 0817  BP: (!) 148/77 (!) 151/91  (!) 169/87  Pulse: 80 77  73  Resp:    18  Temp: 98.2 F (36.8 C) 98.2 F (36.8 C)  98.4 F (36.9 C)  TempSrc: Oral Oral    SpO2: 96% 100%  100%  Weight:   92.6 kg   Height:       Physical Exam General: Adult male in NAD Heart: RRR, no murmurs, rubs or gallops Lungs: CTA bilaterally, respirations unlabored on RA Abdomen: Soft, non-distended, +BS Extremities: No edema, L TMA Dialysis Access:  R internal jugular [facility]  Additional Objective Labs: Basic Metabolic Panel: Recent Labs  Lab 03/08/24 0436 03/09/24 1637 03/11/24 0936 03/12/24 0633 03/14/24 0535  NA 138   < > 137 138 138  K 4.8   < > 5.1 4.6 5.1  CL 98   < > 99 97* 102  CO2 20*   < > 23 20* 21*  GLUCOSE 100*   < > 151* 96 98  BUN 64*   < > 41* 50* 47*  CREATININE 9.95*   < > 8.44* 9.98* 9.17*  CALCIUM  8.6*   < > 8.8* 8.8* 9.0  PHOS 7.8*  --   --  6.8* 6.3*   < > = values in this interval not displayed.   Liver Function Tests: Recent Labs  Lab 03/11/24 0936 03/12/24 0633 03/14/24 0535  AST 19  --   --   ALT 11  --   --   ALKPHOS 49  --   --   BILITOT 0.5  --   --   PROT 8.1  --   --   ALBUMIN  3.5 3.6 3.3*   No results for input(s): "LIPASE", "AMYLASE" in the last 168 hours. CBC: Recent Labs  Lab 03/08/24 0436 03/09/24 1637 03/10/24 0233 03/11/24 0936 03/12/24 0936  WBC 9.2 5.0 8.1 7.3 9.2  NEUTROABS  --  2.8  --  5.4 7.2  HGB 11.0* 9.8* 9.3* 10.2* 9.8*  HCT 33.9* 31.7* 28.8* 32.4* 31.0*  MCV 107.6* 110.5* 109.1* 110.6* 109.2*  PLT 169 137* 146* 135* 144*   Blood Culture    Component Value Date/Time   SDES BLOOD BLOOD RIGHT HAND AEROBIC BOTTLE ONLY  02/09/2024 0414   SPECREQUEST  02/09/2024 0414    BOTTLES DRAWN AEROBIC ONLY Blood Culture adequate volume   CULT  02/09/2024 0414    NO GROWTH 5 DAYS Performed at [facility], [address], [place], [place] [address]    REPTSTATUS 02/14/2024 FINAL 02/09/2024 0414    Cardiac Enzymes: No results for input(s): "CKTOTAL", "CKMB", "CKMBINDEX", "TROPONINI" in the last 168 hours. CBG: Recent Labs  Lab 03/12/24 0731 03/12/24 1641 03/13/24 0752 03/13/24 1628 03/14/24 0730  GLUCAP 82 141* 77 144* 89   Iron  Studies:  Recent Labs    03/12/24 1508  IRON  28*  TIBC 185*  FERRITIN 424*   @lablastinr3 @ Studies/Results: No results found. Medications:  sodium chloride  10 mL/hr at 03/10/24 6045   ceFEPime  (MAXIPIME ) IV 2 g (03/11/24 1218)   promethazine  (PHENERGAN ) injection (IM or IVPB) Stopped (03/14/24 4098)   vancomycin       vitamin C   1,000 mg Oral Daily   atorvastatin   80 mg Oral Daily   bethanechol   10 mg Oral BID   Chlorhexidine  Gluconate Cloth  6 each Topical Q0600   Chlorhexidine  Gluconate Cloth  6 each Topical Q0600   darbepoetin (ARANESP ) injection - DIALYSIS  100 mcg Subcutaneous Q Sat-1800   heparin  injection (subcutaneous)  5,000 Units Subcutaneous Q8H   lactulose   30 g Oral Daily   levothyroxine   50 mcg Oral QAC breakfast   midodrine   5 mg Oral TID WC   multivitamin  1 tablet Oral QHS   naloxegol  oxalate  12.5 mg Oral Daily   nutrition supplement (JUVEN)  1 packet Oral BID BM   oxyCODONE   30 mg Oral Q8H   polyethylene glycol  17 g Oral BID   senna-docusate  4 tablet Oral BID   sevelamer  carbonate  1,600 mg Oral TID WC   tamsulosin   0.4 mg Oral Daily    Dialysis Orders: TTS [place] previously (not he will be going to [place]) 4h  B400  92kg  TDC  Heparin  none - Mircera 225mcg IV q 2 weeks (last 4/9) - Calcitriol  0.14mcg PO q HD  Assessment/Plan: 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 CT imaging showed T9-10 discitis. He refused  MRI, area was too small for aspiration per IR. H/o of foot infection as below. Blood Cxs were negative. Not an operative candidate per neurosurgery.   Per ID continue IV Vanc 750mg  and IV Cefepime  2gm with HD x 6 weeks thru 03/23/24 for the discitis. Note that his HD schedule has changed to MWF to align with new outpatient HD schedule Left foot osteomyelitis - completed an 8 week course of Dapto/Cefepime  on 3/28 for bacteremia presumed due to foot infection. S/p left TMA on 4/30. Antibiotics as above.  Hyperkalemia - continue renal diet.improved  ESRD:   Transitioned to MWF schedule for HD to align with his outpatient schedule (note he will now be MWF [place] given the SNF plans) Planned for HD today Hypotension: started on midodrine  this admission due to hypotension.  He has a history of HTN and is off of multiple anti-hypertensives.  We have been doing little to no UF with HD- having some orthopnea so will try 0.5kg if BP tolerates.  He was hypertensive on midodrine  10 mg TID so we reduced midodrine  to 5 mg TID. BP elevated today, follow post HD Anemia of CKD: Continue aranesp  100 mcg every Saturday. No venofer in setting of infection Metabolic bone disease: hyperphos - continue home binders and encourage HD adherence Nutrition: prot supp for low albumin     Disposition - per primary team. He has been recommended for SNF placement.   Ramona Burner, PA-C 03/14/2024, 9:30 AM  [facility] Pager: [phone]

## 2024-03-14 NOTE — TOC Progression Note (Addendum)
 Transition of Care [facility]) - Progression Note    Patient Details  Name: Thomas [name] MRN: [id number] Date of Birth: 1951-07-17  Transition of Care [facility]) CM/SW Contact  Ashlei Chinchilla A [place], LCSW Phone Number: 03/14/2024, 11:03 AM  Clinical Narrative:     Update 1417 CSW met with pt at bedside and see if he has spoken with [facility]. He stated that he did not want to go there anymore and is refusing short term rehab SNF at DC. He said he'd just have to manage at home alone. Provider, RNCM and facility notified. Estimated DC tomorrow. Pt request for equipment for his toe as well a new prosthetic sleeve with "locking pin."  CSW met with pt at bedside to provide contact number for [place], pt is agreeable to DC there. Stated he had a few questions and wanted to speak with the facility regarding. CSW notified Tonya in admissions as well.  Authorization was restarted, as previous auth expired on 5/23.   Status pending: Auth ID: [id number]   TOC will continue to follow.   Expected Discharge Plan: Skilled Nursing Facility Barriers to Discharge: Continued Medical Work up, English as a second language teacher (OP HD chair)  Expected Discharge Plan and Services     Post Acute Care Choice: Skilled Nursing Facility Living arrangements for the past 2 months: Single Family Home                                       Social Determinants of Health (SDOH) Interventions SDOH Screenings   Food Insecurity: No Food Insecurity (02/09/2024)  Housing: Low Risk  (02/09/2024)  Transportation Needs: No Transportation Needs (02/09/2024)  Utilities: Not At Risk (02/09/2024)  Depression (PHQ2-9): Low Risk  (02/08/2020)  Social Connections: Socially Isolated (02/11/2024)  Tobacco Use: Low Risk  (02/07/2024)    Readmission Risk Interventions    01/15/2022    2:56 PM 01/02/2022    2:03 PM 12/31/2021   10:32 AM  Readmission Risk Prevention Plan  Transportation Screening Complete Complete Complete  Medication Review Special educational needs teacher) Complete Complete Complete  PCP or Specialist appointment within 3-5 days of discharge Complete Complete Complete  HRI or Home Care Consult Complete Complete Complete  SW Recovery Care/Counseling Consult Complete Complete Complete  Palliative Care Screening Not Applicable Not Applicable Not Applicable  Skilled Nursing Facility Not Applicable Not Applicable Not Applicable

## 2024-03-14 NOTE — Progress Notes (Signed)
 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 4/21>> x-ray left wrist: No fracture 4/21>> x-ray left shoulder, no fracture. 4/21>> CT head: No acute intracranial abnormality 4/21>> CT C-spine: No fracture. 4/21>> CT abdomen/pelvis: No acute injury to chest/abdomen/pelvis 4/21>> CT T-spine: Prior posterior fusion from T10-T12-abnormal appearance with collapse of T10-lucency around T10 screws at the T9-10 disc space, T10-11 disc space concerning for discitis/osteomyelitis. 4/21>> CT L-spine: No acute bony abnormality, mild chronic compression fracture at L1. 4/22>> x-ray left foot: Chronic erosive changes about the forefoot similar to 11/21/2023-osteomyelitis/septic arthritis of the 1st/2nd MTP joints difficult to exclude. 4/25>> left ABI: Within normal limit.   Significant microbiology data: 4/22>> blood culture: No growth   Procedures: 4/30>> left transmetatarsal amputation by Dr. Julio Ohm.  He decompensated on 5/22 was sent to ICU because of hypotension?  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-T9-T10 discitis-Nonoperative candidate Osteomyelitis left foot first second metatarsal phalangeal joint on 4/30-- recent completion of Dapto cefepime  on 3/28 IV cefepime  vancomycin  completes on 6/4 with dialysis Continue Oxy IR 10 every 6 as needed moderate pain, OxyContin  30 every 8 for severe pain, Zanaflex  4 every 8 as needed-limit IV opiates--we will stop Iv dialudid pre-HD  touchdown weightbearing on the left leg per ortho  Hypotension -etiology unclear ESRD-hyperkalemia seems resolved after checks and further management  Hypotension possibly secondary to trying to challenge EDW transiently on pressors--BP improved-on midodrine  now at 5 3 times daily [higher dose made patient sweat]y Iron  28/Sat 15---cannot give Iron  so defer ESA to renal  BPH severe hydroureteronephrosis Continue Flomax  0.4, bethanechol  10 twice daily  Left transmetatarsal amputation for osteomyelitis/30 Pain meds as above-would not use IV pain meds  Previous history of DVT PE with recurrence in 2023 Previously on Coumadin  now off the same for unclear reason apparently has IVC filter in place He does not recall why he was taken off of this We will resume and place on a doac with Eliquis  5 bid  Chronic HFpEF Now managed with dialysis-BP meds as above  DVT prophylaxis: SCD  Status is: Inpatient Remains inpatient appropriate because:   Awaiting stability for placement   Subjective:  No real complaints having breakfast this morning no fever no chills no nausea no vomiting Overall looks stable  Objective + exam Vitals:   03/13/24 2012 03/14/24 0447 03/14/24 0500 03/14/24 0817  BP: (!) 148/77 (!) 151/91  (!) 169/87  Pulse: 80 77  73  Resp:    18  Temp: 98.2 F (36.8 C) 98.2 F (  36.8 C)  98.4 F (36.9 C)  TempSrc: Oral Oral    SpO2: 96% 100%  100%  Weight:   92.6 kg    Height:       Filed Weights   03/12/24 1255 03/13/24 0419 03/14/24 0500  Weight: 90.2 kg 89.6 kg 92.6 kg    Examination:  EOMI NCAT no focal deficit no icterus no pallor  Chest is clear no wheeze Abdo menance soft no rebound no guarding ROM is intact Transmetatarsal amputation as below   Data Reviewed: reviewed   CBC    Component Value Date/Time   WBC 9.2 03/12/2024 0936   RBC 2.82 (L) 03/12/2024 0941   RBC 2.84 (L) 03/12/2024 0936   HGB 9.8 (L) 03/12/2024 0936   HCT 31.0 (L) 03/12/2024 0936   PLT 144 (L) 03/12/2024 0936   MCV 109.2 (H) 03/12/2024 0936   MCH 34.5 (H) 03/12/2024 0936   MCHC 31.6 03/12/2024 0936   RDW 15.4 03/12/2024 0936   LYMPHSABS 0.8 03/12/2024 0936   MONOABS 0.7 03/12/2024 0936   EOSABS 0.4 03/12/2024 0936   BASOSABS 0.0 03/12/2024 0936      Latest Ref Rng & Units 03/14/2024    5:35 AM 03/12/2024    6:33 AM 03/11/2024    9:36 AM  CMP  Glucose 70 - 99 mg/dL 98  96  161   BUN 8 - 23 mg/dL 47  50  41   Creatinine 0.61 - 1.24 mg/dL 0.96  0.45  4.09   Sodium 135 - 145 mmol/L 138  138  137   Potassium 3.5 - 5.1 mmol/L 5.1  4.6  5.1   Chloride 98 - 111 mmol/L 102  97  99   CO2 22 - 32 mmol/L 21  20  23    Calcium  8.9 - 10.3 mg/dL 9.0  8.8  8.8   Total Protein 6.5 - 8.1 g/dL   8.1   Total Bilirubin 0.0 - 1.2 mg/dL   0.5   Alkaline Phos 38 - 126 U/L   49   AST 15 - 41 U/L   19   ALT 0 - 44 U/L   11     Scheduled Meds:  vitamin C   1,000 mg Oral Daily   atorvastatin   80 mg Oral Daily   bethanechol   10 mg Oral BID   Chlorhexidine  Gluconate Cloth  6 each Topical Q0600   Chlorhexidine  Gluconate Cloth  6 each Topical Q0600   darbepoetin (ARANESP ) injection - DIALYSIS  100 mcg Subcutaneous Q Sat-1800   heparin  injection (subcutaneous)  5,000 Units Subcutaneous Q8H   lactulose   30 g Oral Daily   levothyroxine   50 mcg Oral QAC breakfast   midodrine   5 mg Oral TID WC   multivitamin  1 tablet Oral QHS   naloxegol  oxalate  12.5 mg Oral Daily    nutrition supplement (JUVEN)  1 packet Oral BID BM   oxyCODONE   30 mg Oral Q8H   polyethylene glycol  17 g Oral BID   senna-docusate  4 tablet Oral BID   sevelamer  carbonate  1,600 mg Oral TID WC   tamsulosin   0.4 mg Oral Daily   Continuous Infusions:  sodium chloride  10 mL/hr at 03/10/24 8119   ceFEPime  (MAXIPIME ) IV 2 g (03/11/24 1218)   promethazine  (PHENERGAN ) injection (IM or IVPB) Stopped (03/14/24 1478)   vancomycin       Time 25  Verlie Glisson, MD  [facility]

## 2024-03-15 DIAGNOSIS — M4624 Osteomyelitis of vertebra, thoracic region: Secondary | ICD-10-CM | POA: Diagnosis not present

## 2024-03-15 LAB — CBC
HCT: 30.8 % — ABNORMAL LOW (ref 39.0–52.0)
Hemoglobin: 9.8 g/dL — ABNORMAL LOW (ref 13.0–17.0)
MCH: 34.8 pg — ABNORMAL HIGH (ref 26.0–34.0)
MCHC: 31.8 g/dL (ref 30.0–36.0)
MCV: 109.2 fL — ABNORMAL HIGH (ref 80.0–100.0)
Platelets: 141 10*3/uL — ABNORMAL LOW (ref 150–400)
RBC: 2.82 MIL/uL — ABNORMAL LOW (ref 4.22–5.81)
RDW: 15.3 % (ref 11.5–15.5)
WBC: 6.1 10*3/uL (ref 4.0–10.5)
nRBC: 0 % (ref 0.0–0.2)

## 2024-03-15 LAB — RENAL FUNCTION PANEL
Albumin: 3.4 g/dL — ABNORMAL LOW (ref 3.5–5.0)
Anion gap: 16 — ABNORMAL HIGH (ref 5–15)
BUN: 59 mg/dL — ABNORMAL HIGH (ref 8–23)
CO2: 20 mmol/L — ABNORMAL LOW (ref 22–32)
Calcium: 8.8 mg/dL — ABNORMAL LOW (ref 8.9–10.3)
Chloride: 103 mmol/L (ref 98–111)
Creatinine, Ser: 10.81 mg/dL — ABNORMAL HIGH (ref 0.61–1.24)
GFR, Estimated: 5 mL/min — ABNORMAL LOW (ref >60)
Glucose, Bld: 89 mg/dL (ref 70–99)
Phosphorus: 7.5 mg/dL — ABNORMAL HIGH (ref 2.5–4.6)
Potassium: 6.4 mmol/L (ref 3.5–5.1)
Sodium: 139 mmol/L (ref 135–145)

## 2024-03-15 LAB — GLUCOSE, CAPILLARY
Glucose-Capillary: 95 mg/dL (ref 70–99)
Glucose-Capillary: 98 mg/dL (ref 70–99)

## 2024-03-15 MED ORDER — APIXABAN 5 MG PO TABS: 5.0000 mg | ORAL_TABLET | Freq: Two times a day (BID) | ORAL | 3 refills | Status: DC

## 2024-03-15 MED ORDER — HYDROMORPHONE HCL 1 MG/ML IJ SOLN
INTRAMUSCULAR | Status: AC
  Filled 2024-03-15: qty 1

## 2024-03-15 MED ORDER — HEPARIN SODIUM (PORCINE) 1000 UNIT/ML IJ SOLN
INTRAMUSCULAR | Status: AC
  Filled 2024-03-15: qty 2

## 2024-03-15 MED ORDER — VANCOMYCIN HCL 750 MG/150ML IV SOLN
750.0000 mg | Freq: Once | INTRAVENOUS | Status: DC
  Filled 2024-03-15: qty 150

## 2024-03-15 MED ORDER — LACTULOSE 10 GM/15ML PO SOLN: 30.0000 g | Freq: Every day | ORAL | 0 refills | Status: DC

## 2024-03-15 MED ORDER — SODIUM CHLORIDE 0.9 % IV SOLN
1.0000 g | INTRAVENOUS | Status: AC
  Administered 2024-03-15: 1 g via INTRAVENOUS
  Filled 2024-03-15: qty 10

## 2024-03-15 MED ORDER — CHLORHEXIDINE GLUCONATE CLOTH 2 % EX PADS
6.0000 | MEDICATED_PAD | Freq: Every day | CUTANEOUS | Status: DC
  Administered 2024-03-16: 6 via TOPICAL

## 2024-03-15 MED ORDER — VANCOMYCIN HCL 750 MG/150ML IV SOLN
750.0000 mg | Freq: Once | INTRAVENOUS | Status: AC
  Administered 2024-03-15: 750 mg via INTRAVENOUS
  Filled 2024-03-15: qty 150

## 2024-03-15 MED ORDER — HEPARIN SODIUM (PORCINE) 1000 UNIT/ML IJ SOLN
INTRAMUSCULAR | Status: AC
  Filled 2024-03-15: qty 4

## 2024-03-15 MED ORDER — SODIUM CHLORIDE 0.9 % IV SOLN
1.0000 g | Freq: Once | INTRAVENOUS | Status: DC
  Filled 2024-03-15: qty 10

## 2024-03-15 MED ORDER — VANCOMYCIN HCL 750 MG/150ML IV SOLN
INTRAVENOUS | Status: AC
  Filled 2024-03-15: qty 150

## 2024-03-15 MED ORDER — MIDODRINE HCL 5 MG PO TABS: 5.0000 mg | ORAL_TABLET | Freq: Three times a day (TID) | ORAL | 3 refills | Status: DC

## 2024-03-15 MED ORDER — BISACODYL 10 MG RE SUPP: 10.0000 mg | Freq: Every day | RECTAL | 0 refills | Status: AC | PRN

## 2024-03-15 NOTE — Discharge Instructions (Signed)

## 2024-03-15 NOTE — Progress Notes (Signed)
 Occupational Therapy Treatment Patient Details Name: Thomas [name] MRN: [id number] DOB: 06/21/1951 Today's Date: 03/15/2024   History of present illness The pt is a 73 yo male presenting 4/20 with back pain after a fall at home. Work up concerning for T9-11 discitis/osteomyelitis and L foot osteomyelitis. 4/30 L transmetatarsal amputation. 5/22 transfer to [facility] for persistent hypotension. 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 Pt unable to rate pain on numerical scale, and declines meds when author offered to request meds from RN. Session focused extensively on providing pt education regarding discharge recommendations for STR, rationale and exploration of options of BADL performance should pt desire to return home. Pt is dismissive of all attempts to educate and problem-solve. MD, PA and RN in room during tx session.  Pt states his plan is to discharge home alone without assistance. He states he will have EMS carry him inside as he has 1 STE, and he will sit in his chair. When this Bolivar Bushman inquires about how pt plans to perform toileting needs or obtain drinks or meals, he states he will use his RW or crutches to walk as his home is not wheelchair accessible. Pt confirms that he has not attempted standing since arriving at hospital given WB precautions on LLE using post-op shoe and poor fitting of R BKA prosthesis. OT educates on high risk of falls and anticipation that pt will likely be significantly weak with extended hospital stay, recommended STR to focus on functional transfers and safe mobility. Pt then states he will have EMS take him to another hospital but remains adamant about not going to STR. Pt is at a extremely  high risk for falls & hospital readmission, presents with decreased situational awareness and unrealistic expectations of discharge home given current functional status.   If pt to return home, he will need 24/7 supervision and assist with hospital bed & drop arm BSC. Patient will benefit from continued inpatient follow up therapy, <3 hours/day.      If plan is discharge home, recommend the following:  Two people to help with walking and/or transfers;A lot of help with bathing/dressing/bathroom;Assistance with cooking/housework;Direct supervision/assist for medications management;Direct supervision/assist for financial management;Assist for transportation;Help with stairs or ramp for entrance;Supervision due to cognitive status   Equipment Recommendations  Hospital bed, drop arm BSC      Precautions / Restrictions Precautions Precautions: Back;Fall Precaution Booklet Issued: Yes (comment) Recall of Precautions/Restrictions: Impaired Precaution/Restrictions Comments: Pt does not adhere to back precautions, even with education. Required Braces or Orthoses: Spinal Brace Spinal Brace: Thoracolumbosacral orthotic;Applied in sitting position Other Brace: Post op shoe Restrictions Weight Bearing Restrictions Per Provider Order: Yes LLE Weight Bearing Per Provider Order: Touchdown weight bearing       Mobility Bed Mobility Overal bed mobility: Needs Assistance             General bed mobility comments: pt refuses to sit EOB    Transfers Overall transfer level: Needs assistance                 General transfer comment: Pt refused OOB mobility at this time.     Balance Overall balance assessment: History of Falls, Needs assistance  ADL either performed or assessed with clinical judgement   ADL Overall ADL's : Needs assistance/impaired Eating/Feeding: Set up;Bed level                                      General ADL Comments: Pt refusing to participate in ADL peformance this date. Session focused extensively on discharge recommendations and discussing BADL routines at home. Pt refusing to attempt seated EOB ADLs or transfers     Communication Communication Communication: No apparent difficulties Factors Affecting Communication: Reduced clarity of speech   Cognition Arousal: Alert Behavior During Therapy: WFL for tasks assessed/performed Cognition: No family/caregiver present to determine baseline             OT - Cognition Comments: likely at baseline, pt adamant about returning home. decreased insight into overall situation, wants to dc home alone but does not have a plan setup for basic meals or toileting needs. states EMS will carry him into home, and he will sit in his chair. when asked about how he will get to bathroom, he says he will use his crutches (has not attempted standing since arrival to hospital 35+ days ago).                 Following commands: Intact        Cueing   Cueing Techniques: Verbal cues, Gestural cues  Exercises Exercises: Other exercises Other Exercises Other Exercises: upon arrival to room, pt insisting on showing OT UB exercises he has been performing in bed. Supine Shoulder Horizontal Abduction with Resistance, shoulder flexion with resistance, Elbow Flexion with Anchored Resistance.       General Comments MD and care team updated via secure chat on pt's refusal of SNF and plan to d/c home alone without support or ability to care for self    Pertinent Vitals/ Pain       Pain Assessment Pain Assessment: 0-10 Pain Score:  (pt asked multiple times to rate pain, did not give a number. no s/s of pain observed) Pain Location: back Pain Intervention(s): Limited activity within patient's tolerance         Frequency  Min 1X/week        Progress Toward Goals  OT Goals(current goals can now be found in the care plan section)   Progress towards OT goals: OT to reassess next treatment (pt self-limits participation)  Acute Rehab OT Goals OT Goal Formulation: With patient Time For Goal Achievement: 03/21/24 Potential to Achieve Goals: Fair ADL Goals Pt Will Perform Upper Body Dressing: with set-up;sitting Pt Will Perform Lower Body Dressing: with set-up;sitting/lateral leans;bed level;with adaptive equipment Pt Will Transfer to Toilet: with supervision;with transfer board;anterior/posterior transfer;bedside commode Pt Will Perform Toileting - Clothing Manipulation and hygiene: with supervision Additional ADL Goal #1: Pt to don TLSO brace with Modified Independence  Plan         AM-PAC OT "6 Clicks" Daily Activity     Outcome Measure   Help from another person eating meals?: None Help from another person taking care of personal grooming?: A Little Help from another person toileting, which includes using toliet, bedpan, or urinal?: A Lot Help from another person bathing (including washing, rinsing, drying)?: A Little Help from another person to put on and taking off regular upper body clothing?: A Little Help from another person to put on and taking off regular lower body clothing?: A Lot 6 Click Score: 17  End of Session    OT Visit Diagnosis: Unsteadiness on feet (R26.81);Repeated falls (R29.6);History of falling (Z91.81);Muscle weakness (generalized) (M62.81);Pain   Activity Tolerance Patient tolerated treatment well   Patient Left in bed;with call bell/phone within reach;with bed alarm set   Nurse Communication Mobility status        Time: 1610-9604 OT Time Calculation (min): 27 min  Charges: OT General Charges $OT Visit: 1 Visit OT Treatments $Self Care/Home Management : 23-37 mins  Krista Godsil L. Nadirah Socorro, OTR/L  03/15/24, 1:06 PM

## 2024-03-15 NOTE — TOC Transition Note (Signed)
 Transition of Care [facility]) - Discharge Note   Patient Details  Name: Thomas [name] MRN: [id number] Date of Birth: 10/25/1950  Transition of Care [facility]) CM/SW Contact:  [name], Angelique Ken, RN Phone Number: 03/15/2024, 11:18 AM   Clinical Narrative:     Patient is scheduled for discharge today.  Readmission Risk Assessment done. Patient declined going to SNF. CM notified by Renal Navigator that patient's HD clinic is requesting a motorized wheelchair and ramp for patient. CM spoke with patient and he states it will not work. States his house is not designed for wheelchairs as he had tried to use it before. Patient insisted on going home.  Home health info, Outpatient f/u, hospital f/u and discharge instructions on AVS. [facility] to transport at discharge.  No further TOC needs noted.        Final next level of care: Home/Self Care Barriers to Discharge: Barriers Resolved   Patient Goals and CMS Choice Patient states their goals for this hospitalization and ongoing recovery are:: To return home CMS Medicare.gov Compare Post Acute Care list provided to:: Patient Choice offered to / list presented to : Patient [place] ownership interest in [facility].provided to:: Patient    Discharge Placement                Patient to be transferred to facility by: PTAR      Discharge Plan and Services Additional resources added to the After Visit Summary for       Post Acute Care Choice: Skilled Nursing Facility                    HH Arranged: PT, RN, OT, Nurse's Aide, Disease Management, Social Work [facility] Agency: [facility] Date [facility] Agency Contacted: 03/14/24 Time HH Agency Contacted: 1220 Representative spoke with at [facility] Agency: Loetta Ringer  Social Drivers of Health (SDOH) Interventions SDOH Screenings   Food Insecurity: No Food Insecurity (02/09/2024)  Housing: Low Risk  (02/09/2024)  Transportation Needs: No Transportation Needs (02/09/2024)   Utilities: Not At Risk (02/09/2024)  Depression (PHQ2-9): Low Risk  (02/08/2020)  Social Connections: Socially Isolated (02/11/2024)  Tobacco Use: Low Risk  (02/07/2024)     Readmission Risk Interventions    03/15/2024   11:16 AM 01/15/2022    2:56 PM 01/02/2022    2:03 PM  Readmission Risk Prevention Plan  Transportation Screening Complete Complete Complete  Medication Review (RN Care Manager) Referral to Pharmacy Complete Complete  PCP or Specialist appointment within 3-5 days of discharge Complete Complete Complete  HRI or Home Care Consult Complete Complete Complete  SW Recovery Care/Counseling Consult Complete Complete Complete  Palliative Care Screening Not Applicable Not Applicable Not Applicable  Skilled Nursing Facility Not Applicable Not Applicable Not Applicable

## 2024-03-15 NOTE — Progress Notes (Signed)
 Pt's case has been discussed with attending, renal PA, RN CM, and CSW throughout the morning. At this time, pt agreeable to snf at [facility]. Working with [facility] to get pt's referral re-opened due to pt saying earlier this morning he plans to return home. Pt's referral for clinic placement in [place] due to snf placement re-opened with [facility]. Pt approved for [place] on MWF 11:15 am chair time. Pt will need to arrive at 10:30 for first appt to complete paperwork prior to treatment. This info provided to CSW to provide to snf. Clinic advised that pt will hopefully start on Friday. Will assist as needed.   Lauraine Polite Renal Navigator [phone]

## 2024-03-15 NOTE — Discharge Planning (Signed)
 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     Oxymorphone Other (See Comments)    Causes kidney problems   Amitriptyline Other (See Comments)   Aspirin Nausea And Vomiting    Gi upset, has kidney issues   Beta Vulgaris Nausea And Vomiting   Buspirone Other (See Comments)    Affected his head    Cabbage Nausea And Vomiting   Codeine Other (See Comments)    Pt does not remember the reaction but knows he can not take    Cyclobenzaprine Nausea And Vomiting   Diflunisal Nausea And Vomiting and Other (See Comments)   Fish Allergy Nausea And Vomiting   Fish-Derived Products Nausea And Vomiting   Hydroxyzine Nausea And Vomiting   Levetiracetam Other (See Comments)   Methadone Other (See Comments)    Made him loopy   Nalbuphine Other (See Comments)    Cutaneous eruption (morphologic abnormality)   Pentazocine Other (See Comments)    Pt does not remember the reaction    Propoxyphene Other (See Comments)    Pt does not remember the reaction     Shellfish Allergy Nausea And Vomiting   Sulfa Antibiotics Hives   Sulfasalazine Other (See Comments)    Pt does not remember the reaction   Amoxicillin  Nausea And Vomiting and Rash    Per [facility]     Dialysis Prescription: Dialysis Frequency: TIW Tx duration: 4 hours BFR: 400 DFR: 800 EDW: 91kg  Dialyzer: 180NRe UF profile/Sodium modeling?: no Dialysis Bath: 2 K 2 Ca  Dialysis access: Access type: [facility] Date placed: 06/27/23 Surgeon: unknown  In Center  Medications: Heparin  Dose: none  Type: N/A VDRA: Calcitriol  0.25 q HD Venofer: none Mircera: 150 mcg IV q 2 weeks Next dose due: 03/19/24 Sensipar: none IV antibiotics: Vancomycin  750mg  IV q HD, Cefepime  2g IV q HD- stop date 03/23/24  Discharge labs: Hgb: 9.8 K+: 6.4 (pre-HD) Ca: 8.8  Phos: 7.5 Alb: 3.4  Please draw routine labs. Additional labs needed: routine monthly labs Additional notes/follow-up: see antibiotic order above  Ramona Burner, PA-C 03/15/2024, 3:34 PM  [facility] Pager: [phone]

## 2024-03-15 NOTE — Progress Notes (Signed)
 Pt didn't get vanc/cefepime  yesterday likely due to holiday. We will give vanc 750mg /cefepime  1g x1 after HD today then resume current dose tomorrow.  Ivery Marking, PharmD, BCIDP, AAHIVP, CPP Infectious Disease Pharmacist 03/15/2024 9:21 AM

## 2024-03-15 NOTE — Discharge Summary (Signed)
 Physician Discharge Summary  Thomas [name] NFA:[id number] DOB: August 24, 1951 DOA: 02/07/2024  PCP: Wright Heal, MD  Admit date: 02/07/2024 Discharge date: 03/15/2024  Time spent: 46 minutes  Recommendations for Outpatient Follow-up:  Needs Chem-12 CBC in about 1 week Needs labs routinely as he is on antibiotics presumably until 03/23/2024 for treatment of thoracic osteomyelitis Home health ordered at discharge given high risk of readmission high risk of worsening of wound high risk of death and to check in on him Patient should follow-up with his usual physician who prescribes him his pain meds he will not be getting any prescriptions at discharge for pain control  Discharge Diagnoses:  MAIN problem for hospitalization   Thoracic osteomyelitis involving T9-T11 Hypotension necessitating transient pressors Osteomyelitis Mechanical fall ESRD TTS  Please see below for itemized issues addressed in HOpsital- refer to other progress notes for clarity if needed  Discharge Condition: Guarded high risk for readmission  Diet recommendation: Renal  Filed Weights   03/12/24 1255 03/13/24 0419 03/14/24 0500  Weight: 90.2 kg 89.6 kg 92.6 kg    History of present illness:  73 year old male 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-previously on Coumadin  BPH severe right hydroureteronephrosis Systolic diastolic heart failure EF 35-40% ESRD TTS in [place] 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 with posterior spinal fusion T12-T12   Referred 08/27/2023 for wound to shin-also had bowel blockage hospitalized at [facility] sometime in mid March 2025-apparently hospitalized 11/26/2023 for foot osteo declined amputation placed on Dapto ampicillin apparently-and completed therapy 01/15/2024 Was admitted 4/20 for back pain for concern of thoracic osteo-he is to complete IV Vanco and IV cefepime  6 weeks through 03/23/2024 for HD 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 would be risky and that he is at high risk for limb loss further worsening and high likelihood of readmission and/or death-as he still despite this counseling over several days wishes to go home with home health   4/21>> x-ray right elbow: No fracture. 4/21>> x-ray left wrist: No fracture 4/21>> x-ray left shoulder, no fracture. 4/21>> CT head: No acute intracranial abnormality 4/21>> CT C-spine: No fracture. 4/21>> CT abdomen/pelvis: No acute injury to chest/abdomen/pelvis 4/21>> CT T-spine: Prior posterior fusion from T10-T12-abnormal appearance with collapse of T10-lucency around T10 screws at the T9-10 disc space, T10-11 disc space concerning for discitis/osteomyelitis. 4/21>> CT L-spine: No acute bony abnormality, mild chronic compression fracture at L1. 4/22>> x-ray left foot: Chronic erosive changes about the forefoot similar to 11/21/2023-osteomyelitis/septic arthritis of the 1st/2nd MTP joints difficult to exclude. 4/25>> left ABI: Within normal limit. 5/22 was sent to ICU because of hypotension?  Received pain meds systolic blood pressures in 50s and 60s placed on Levophed-he was transferred back out to the floor the day after and we resumed care   Significant microbiology data: 4/22>> blood culture: No growth   Procedures: 4/30>> left transmetatarsal amputation by Dr. Julio Ohm.      Plan   Thoracic osteomyelitis discitis after fall 4/20-T9-T10 discitis-Nonoperative candidate Osteomyelitis left foot first second metatarsal phalangeal joint on 4/30-- recent completion of Dapto cefepime  on 3/28 IV cefepime  vancomycin  completes on 6/4 with dialysis He will continue  pain control as per his outpatient  regimen no prescriptions will be given as he has chronic pain and does have a pain physician in the regular physician who can prescribe his Oxy IR every 8 30 mg   Hypotension -etiology unclear ESRD dialyzes usually TTS in -hyperkalemia seems resolved after checks and further management  Hypotension possibly secondary to trying to challenge EDW transiently on pressors--BP improved-on midodrine  now at 5 3 times daily [higher dose made patient sweat]y Iron  28/Sat 15---cannot give Iron  so defer ESA to renal as an outpatient- Social great checking to ensure that he can follow-up at dialysis unit that he was at previously and get the dialysis as well as antibiotics by HD   BPH severe hydroureteronephrosis Continue Flomax  0.4, bethanechol  10 twice daily   Left transmetatarsal amputation for osteomyelitis 4/30 Pain meds as above-would not use IV pain meds needs to complete therapy   Previous history of DVT PE with recurrence in 2023 Previously on Coumadin  now off the same for unclear reason apparently has IVC filter in place He does not recall why he was taken off of this We will resume and place on a doac with Eliquis  5 bid   Chronic HFpEF Now managed with dialysis-BP meds as above  Discharge Exam: Vitals:   03/15/24 0555 03/15/24 0941  BP: (!) 171/94 (!) 173/100  Pulse: 73 79  Resp: 19 18  Temp: 97.9 F (36.6 C) 98.3 F (36.8 C)  SpO2: 96% 98%    Subj on day of d/c   He is sitting up in bed talking with therapy He has no overt discomfort He still refuses to go to rehab  General Exam on discharge  EOMI NCAT no focal deficit no icterus no pallor Chest clear Does not seem to be in pain S1-S2 no murmur Wound looks about the same as it has Albumin  soft  Discharge Instructions   Discharge Instructions     Diet - low sodium heart healthy   Complete by: As directed    Discharge instructions   Complete by: As directed    If you  have severe bleeding breakdown of the wound on your leg or oozing and discomfort on that leg where you had surgery present yourself immediately to the hospital Make sure that you go to dialysis and get your antibiotics through dialysis for the duration of time that has been recommended by infectious disease to treat your back and neck infection We will get home health therapy to come out and assist you as best able Get labs periodically at dialysis Wear the postop shoe at all times   Home infusion instructions   Complete by: As directed    To be given at the HD center   Instructions: Flushing of vascular access device: 0.9% NaCl pre/post medication administration and prn patency; Heparin  100 u/ml, 5ml for implanted ports and Heparin  10u/ml, 5ml for all other central venous catheters.   Increase activity slowly   Complete by: As directed    No wound care   Complete by: As directed       Allergies as of 03/15/2024       Reactions   Other Other (See Comments)   Blood pressure issues Per [facility] hospital: Pt states he can only take these pain meds or else he gets very sick: Oxycontin ,morphine ,demerol, and dilaudid  are the only pain meds pt states he can take.    Oxymorphone Other (See Comments)   Causes kidney problems   Amitriptyline Other (See Comments)   Aspirin Nausea And Vomiting  Gi upset, has kidney issues   Beta Vulgaris Nausea And Vomiting   Buspirone Other (See Comments)   Affected his head   Cabbage Nausea And Vomiting   Codeine Other (See Comments)   Pt does not remember the reaction but knows he can not take   Cyclobenzaprine Nausea And Vomiting   Diflunisal Nausea And Vomiting, Other (See Comments)   Fish Allergy Nausea And Vomiting   Fish-derived Products Nausea And Vomiting   Hydroxyzine Nausea And Vomiting   Levetiracetam Other (See Comments)   Methadone Other (See Comments)   Made him loopy   Nalbuphine Other (See Comments)   Cutaneous eruption (morphologic  abnormality)   Pentazocine Other (See Comments)   Pt does not remember the reaction    Propoxyphene Other (See Comments)   Pt does not remember the reaction   Shellfish Allergy Nausea And Vomiting   Sulfa Antibiotics Hives   Sulfasalazine Other (See Comments)   Pt does not remember the reaction   Amoxicillin  Nausea And Vomiting, Rash   Per [facility]        Medication List     STOP taking these medications    amLODipine  10 MG tablet Commonly known as: NORVASC    cloNIDine  0.1 MG tablet Commonly known as: CATAPRES    hydrALAZINE  25 MG tablet Commonly known as: APRESOLINE    Lasix  40 MG tablet Generic drug: furosemide    losartan  50 MG tablet Commonly known as: COZAAR    magnesium  oxide 400 MG tablet Commonly known as: MAG-OX   metoprolol  tartrate 50 MG tablet Commonly known as: LOPRESSOR    warfarin 10 MG tablet Commonly known as: COUMADIN        TAKE these medications    apixaban  5 MG Tabs tablet Commonly known as: ELIQUIS  Take 1 tablet (5 mg total) by mouth 2 (two) times daily.   atorvastatin  80 MG tablet Commonly known as: LIPITOR  Take 80 mg by mouth daily.   bethanechol  10 MG tablet Commonly known as: URECHOLINE  Take 10 mg by mouth 2 (two) times daily.   bisacodyl  10 MG suppository Commonly known as: DULCOLAX Place 1 suppository (10 mg total) rectally daily as needed for moderate constipation.   ceFEPime  IVPB Commonly known as: MAXIPIME  Inject 2 g into the vein Every Tuesday,Thursday,and Saturday with dialysis. Indication:  Discitis/osteo Last Day of Therapy:  03/23/24 Labs - Once weekly:  CBC/D and BMP, ESR and CRP Method of administration: Per hemodialysis protocol, to be given at HD center=   Cholecalciferol  125 MCG (5000 UT) capsule Take 5,000 Units by mouth daily.   cyanocobalamin  1000 MCG tablet Commonly known as: VITAMIN B12 Take 1 tablet by mouth daily.   lactulose  10 GM/15ML solution Commonly known as: CHRONULAC  Take 45 mLs  (30 g total) by mouth daily.   levothyroxine  50 MCG tablet Commonly known as: SYNTHROID  Take 50 mcg by mouth daily before breakfast.   midodrine  5 MG tablet Commonly known as: PROAMATINE  Take 1 tablet (5 mg total) by mouth 3 (three) times daily with meals.   OxyCONTIN  30 MG 12 hr tablet Generic drug: oxyCODONE  Take 30 mg by mouth 3 (three) times daily as needed (pain).   senna-docusate 8.6-50 MG tablet Commonly known as: Senokot-S Take 4 tablets by mouth 2 (two) times daily.   sevelamer  800 MG tablet Commonly known as: RENAGEL  Take 1,600 mg by mouth 3 (three) times daily.   tamsulosin  0.4 MG Caps capsule Commonly known as: FLOMAX  Take 1 capsule (0.4 mg total) by mouth daily.   tiZANidine  4  MG tablet Commonly known as: ZANAFLEX  Take 4 mg by mouth every 8 (eight) hours as needed for muscle spasms.   vancomycin  IVPB Inject 750 mg into the vein Every Tuesday,Thursday,and Saturday with dialysis. Indication:  Discitis/osteo Last Day of Therapy:  03/23/24 Labs - Once weekly:  CBC/D, BMP, and pre-HD Vanc level (goal 15-25 mcg/ml), ESR and CRP Method of administration: Per hemodialysis protocol - to be given at HD center   Vitamin E 670 MG (1000 UT) Caps Take 1,000 mg by mouth daily.               Home Infusion Instuctions  (From admission, onward)           Start     Ordered   02/11/24 0000  Home infusion instructions       Comments: To be given at the HD center  Question:  Instructions  Answer:  Flushing of vascular access device: 0.9% NaCl pre/post medication administration and prn patency; Heparin  100 u/ml, 5ml for implanted ports and Heparin  10u/ml, 5ml for all other central venous catheters.   02/11/24 0747           Allergies  Allergen Reactions   Other Other (See Comments)    Blood pressure issues Per [facility] hospital: Pt states he can only take these pain meds or else he gets very sick: Oxycontin ,morphine ,demerol, and dilaudid  are the only pain meds  pt states he can take.     Oxymorphone Other (See Comments)    Causes kidney problems   Amitriptyline Other (See Comments)   Aspirin Nausea And Vomiting    Gi upset, has kidney issues   Beta Vulgaris Nausea And Vomiting   Buspirone Other (See Comments)    Affected his head    Cabbage Nausea And Vomiting   Codeine Other (See Comments)    Pt does not remember the reaction but knows he can not take    Cyclobenzaprine Nausea And Vomiting   Diflunisal Nausea And Vomiting and Other (See Comments)   Fish Allergy Nausea And Vomiting   Fish-Derived Products Nausea And Vomiting   Hydroxyzine Nausea And Vomiting   Levetiracetam Other (See Comments)   Methadone Other (See Comments)    Made him loopy   Nalbuphine Other (See Comments)    Cutaneous eruption (morphologic abnormality)   Pentazocine Other (See Comments)    Pt does not remember the reaction    Propoxyphene Other (See Comments)    Pt does not remember the reaction     Shellfish Allergy Nausea And Vomiting   Sulfa Antibiotics Hives   Sulfasalazine Other (See Comments)    Pt does not remember the reaction   Amoxicillin  Nausea And Vomiting and Rash    Per [facility]    Contact information for follow-up providers     Timothy Ford, MD Follow up in 1 week(s).   Specialty: Orthopedic Surgery Contact information: [address] [place] [address] [phone]         [facility], [facility]. Go on 03/14/2024.   Why: Schedule is Monday, Wednesday, Friday with 11:15 am chair time.  Please arrive at 10:30 am for first appointment to complete paperwork prior to treatment. Contact information: [address] [place] [address] [phone]         [facility] Follow up.   Specialty: Home Health Services Why: Someone will call you to schedule first home visit. Contact information: [address] [place] [address] [phone]  Contact  information for after-discharge care     Destination     [facility] Preferred SNF .   Service: Skilled Nursing Contact information: [address] [place]  [phone]                      The results of significant diagnostics from this hospitalization (including imaging, microbiology, ancillary and laboratory) are listed below for reference.    Significant Diagnostic Studies: ECHOCARDIOGRAM COMPLETE Result Date: 03/10/2024    ECHOCARDIOGRAM REPORT   Patient Name:   Thomas [name] Date of Exam: 03/10/2024 Medical Rec #:  [id number]       Height:       77.0 in Accession #:    [id number]      Weight:       185.8 lb Date of Birth:  1950-11-15       BSA:          2.168 m Patient Age:    72 years        BP:           110/67 mmHg Patient Gender: M               HR:           62 bpm. Exam Location:  Inpatient Procedure: 2D Echo, Cardiac Doppler and Color Doppler (Both Spectral and Color            Flow Doppler were utilized during procedure). Indications:    Shock  History:        Patient has prior history of Echocardiogram examinations, most                 recent 12/19/2021. Abnormal ECG, Arrythmias:SVT,                 Signs/Symptoms:Chest Pain; Risk Factors:Hypertension and                 Diabetes. ESRD. Pulmonary embolus.  Sonographer:    Raynelle Callow RDCS Referring Phys: [id number] [facility]  Sonographer Comments: Technically difficult study due to poor echo windows. Patient windows very high. IMPRESSIONS  1. Left ventricular ejection fraction, by estimation, is 50 to 55%. The left ventricle has low normal function. The left ventricle has no regional wall motion abnormalities. Left ventricular diastolic parameters are consistent with Grade I diastolic dysfunction (impaired relaxation). There is the interventricular septum is flattened in systole and diastole, consistent with right ventricular pressure and volume overload.  2. Right  ventricular systolic function is moderately reduced. The right ventricular size is moderately enlarged.  3. Right atrial size was moderately dilated.  4. The mitral valve is normal in structure. Mild mitral valve regurgitation. No evidence of mitral stenosis.  5. The aortic valve is tricuspid. There is moderate calcification of the aortic valve. Aortic valve regurgitation is not visualized. No aortic stenosis is present.  6. There is dilatation of the ascending aorta, measuring 41 mm.  7. The inferior vena cava is normal in size with greater than 50% respiratory variability, suggesting right atrial pressure of 3 mmHg. FINDINGS  Left Ventricle: Left ventricular ejection fraction, by estimation, is 50 to 55%. The left ventricle has low normal function. The left ventricle has no regional wall motion abnormalities. The left ventricular internal cavity size was normal in size. There is no left ventricular hypertrophy. The interventricular septum is flattened in systole and diastole, consistent with right ventricular pressure and volume  overload. Left ventricular diastolic parameters are consistent with Grade I diastolic dysfunction (impaired relaxation). Right Ventricle: The right ventricular size is moderately enlarged. No increase in right ventricular wall thickness. Right ventricular systolic function is moderately reduced. Left Atrium: Left atrial size was normal in size. Right Atrium: Right atrial size was moderately dilated. Pericardium: There is no evidence of pericardial effusion. Mitral Valve: The mitral valve is normal in structure. Mild mitral valve regurgitation. No evidence of mitral valve stenosis. Tricuspid Valve: The tricuspid valve is normal in structure. Tricuspid valve regurgitation is not demonstrated. No evidence of tricuspid stenosis. Aortic Valve: The aortic valve is tricuspid. There is moderate calcification of the aortic valve. Aortic valve regurgitation is not visualized. No aortic stenosis is  present. Pulmonic Valve: The pulmonic valve was normal in structure. Pulmonic valve regurgitation is not visualized. No evidence of pulmonic stenosis. Aorta: The aortic root is normal in size and structure. There is dilatation of the ascending aorta, measuring 41 mm. Venous: The inferior vena cava is normal in size with greater than 50% respiratory variability, suggesting right atrial pressure of 3 mmHg. IAS/Shunts: The interatrial septum was not well visualized.  LEFT VENTRICLE PLAX 2D LVIDd:         5.50 cm      Diastology LVIDs:         3.60 cm      LV e' medial:    6.53 cm/s LV PW:         1.00 cm      LV E/e' medial:  9.1 LV IVS:        0.90 cm      LV e' lateral:   11.10 cm/s LVOT diam:     2.90 cm      LV E/e' lateral: 5.3 LV SV:         110 LV SV Index:   51 LVOT Area:     6.61 cm  LV Volumes (MOD) LV vol d, MOD A2C: 121.0 ml LV vol d, MOD A4C: 78.4 ml LV vol s, MOD A2C: 48.1 ml LV vol s, MOD A4C: 34.5 ml LV SV MOD A2C:     72.9 ml LV SV MOD A4C:     78.4 ml LV SV MOD BP:      57.5 ml RIGHT VENTRICLE             IVC RV S prime:     11.60 cm/s  IVC diam: 1.60 cm TAPSE (M-mode): 1.7 cm LEFT ATRIUM             Index        RIGHT ATRIUM           Index LA diam:        3.70 cm 1.71 cm/m   RA Area:     25.00 cm LA Vol (A2C):   80.9 ml 37.31 ml/m  RA Volume:   88.10 ml  40.63 ml/m LA Vol (A4C):   56.0 ml 25.83 ml/m LA Biplane Vol: 69.7 ml 32.15 ml/m  AORTIC VALVE LVOT Vmax:   74.90 cm/s LVOT Vmean:  46.100 cm/s LVOT VTI:    0.166 m  AORTA Ao Root diam: 3.60 cm Ao Asc diam:  4.10 cm MITRAL VALVE MV Area (PHT): 2.39 cm    SHUNTS MV Decel Time: 317 msec    Systemic VTI:  0.17 m MV E velocity: 59.20 cm/s  Systemic Diam: 2.90 cm MV A velocity: 79.70 cm/s MV E/A ratio:  0.74 Aditya Sabharwal  Electronically signed by Alwin Baars Signature Date/Time: 03/10/2024/5:56:29 PM    Final    DG CHEST PORT 1 VIEW Result Date: 03/06/2024 CLINICAL DATA:  [id number].  Dyspnea. EXAM: PORTABLE CHEST 1 VIEW COMPARISON:  PA and  lateral chest 01/10/2024 FINDINGS: Right IJ dialysis catheter again terminates at the superior cavoatrial junction. Median sternotomy sutures are again noted and partially visible lower thoracic spinal hardware. The lungs are expiratory. There are chronic interstitial changes. No focal pneumonia is seen. The sulci are sharp. Cardiomegaly. No vascular congestion is seen. Stable mediastinum with aortic tortuosity and atherosclerosis. Osteopenia. IMPRESSION: 1. Expiratory chest with chronic interstitial changes. No evidence of acute chest disease. 2. Cardiomegaly. 3. Aortic atherosclerosis. Electronically Signed   By: Denman Fischer M.D.   On: 03/06/2024 01:45    Microbiology: Recent Results (from the past 240 hours)  MRSA Next Gen by PCR, Nasal     Status: None   Collection Time: 03/10/24  8:39 AM   Specimen: Nasal Mucosa; Nasal Swab  Result Value Ref Range Status   MRSA by PCR Next Gen NOT DETECTED NOT DETECTED Final    Comment: (NOTE) The GeneXpert MRSA Assay (FDA approved for NASAL specimens only), is one component of a comprehensive MRSA colonization surveillance program. It is not intended to diagnose MRSA infection nor to guide or monitor treatment for MRSA infections. Test performance is not FDA approved in patients less than 43 years old. Performed at [facility], [address], [place], [place] [address]      Labs: Basic Metabolic Panel: Recent Labs  Lab 03/09/24 1637 03/10/24 0233 03/11/24 0936 03/12/24 0633 03/14/24 0535  NA 139 135 137 138 138  K 4.4 4.2 5.1 4.6 5.1  CL 98 97* 99 97* 102  CO2 25 26 23  20* 21*  GLUCOSE 95 114* 151* 96 98  BUN 41* 26* 41* 50* 47*  CREATININE 8.07* 5.77* 8.44* 9.98* 9.17*  CALCIUM  8.8* 8.6* 8.8* 8.8* 9.0  PHOS  --   --   --  6.8* 6.3*   Liver Function Tests: Recent Labs  Lab 03/11/24 0936 03/12/24 0633 03/14/24 0535  AST 19  --   --   ALT 11  --   --   ALKPHOS 49  --   --   BILITOT 0.5  --   --   PROT 8.1  --   --    ALBUMIN  3.5 3.6 3.3*   No results for input(s): "LIPASE", "AMYLASE" in the last 168 hours. No results for input(s): "AMMONIA" in the last 168 hours. CBC: Recent Labs  Lab 03/09/24 1637 03/10/24 0233 03/11/24 0936 03/12/24 0936  WBC 5.0 8.1 7.3 9.2  NEUTROABS 2.8  --  5.4 7.2  HGB 9.8* 9.3* 10.2* 9.8*  HCT 31.7* 28.8* 32.4* 31.0*  MCV 110.5* 109.1* 110.6* 109.2*  PLT 137* 146* 135* 144*   Cardiac Enzymes: No results for input(s): "CKTOTAL", "CKMB", "CKMBINDEX", "TROPONINI" in the last 168 hours. BNP: BNP (last 3 results) No results for input(s): "BNP" in the last 8760 hours.  ProBNP (last 3 results) No results for input(s): "PROBNP" in the last 8760 hours.  CBG: Recent Labs  Lab 03/13/24 1628 03/14/24 0730 03/14/24 1138 03/14/24 1646 03/14/24 2058  GLUCAP 144* 89 141* 101* 121*    Signed:  Verlie Glisson MD   [facility] 03/15/2024, 9:55 AM

## 2024-03-15 NOTE — TOC Progression Note (Addendum)
 Transition of Care [facility]) - Progression Note    Patient Details  Name: Thomas [name] MRN: [id number] Date of Birth: Jun 13, 1951  Transition of [facility] CM/SW Contact  Carmon Christen, LCSWA Phone Number: 03/15/2024, 12:44 PM  Clinical Narrative:     Patient currently agreeable to SNF placement.Patient confirmed preference for [facility]. CSW spoke with [place] [facility] who confirmed SNF bed for patient. [place] renal navigator infomred CSW that she is working HD center to confirm schedule, once confirmed will notify CSW. CSW requested for CMA to start insurance authorization for patient. CSW will continue to follow.  Update- Patients insurance authorization is approved 5/27 - 5/29 NRD Cesar Collins NW#[id number]. CSW informed facility. Patient requested private room. Tonya with [facility] checking to see if they have a private room available today.   Update- Tonya with [facility] informed CSW that facility can accept patient tomorrow for SNF. CSW informed MD,renal navigator, and team.  CSW provided tonya with patients HD information. [place] on MWF 11:15 am chair time. Pt will need to arrive at 10:30 for first appt to complete paperwork prior to treatment. Facility informed CSW they can accept patient tomorrow if medically stable. CSW informed facility that first day at HD clinic will be Friday and will need to arrive by 10:30am.  Expected Discharge Plan: Skilled Nursing Facility Barriers to Discharge: Barriers Resolved  Expected Discharge Plan and Services     Post Acute Care Choice: Skilled Nursing Facility Living arrangements for the past 2 months: Single Family Home Expected Discharge Date: 03/15/24                         HH Arranged: PT, RN, OT, Nurse's Aide, Disease Management, Social Work [employer] Agency: [facility] Date [facility] Agency Contacted: 03/14/24 Time HH Agency Contacted: 1220 Representative spoke with at [facility] Agency: Loetta Ringer   Social Determinants of Health (SDOH)  Interventions SDOH Screenings   Food Insecurity: No Food Insecurity (02/09/2024)  Housing: Low Risk  (02/09/2024)  Transportation Needs: No Transportation Needs (02/09/2024)  Utilities: Not At Risk (02/09/2024)  Depression (PHQ2-9): Low Risk  (02/08/2020)  Social Connections: Socially Isolated (02/11/2024)  Tobacco Use: Low Risk  (02/07/2024)    Readmission Risk Interventions    03/15/2024   11:16 AM 01/15/2022    2:56 PM 01/02/2022    2:03 PM  Readmission Risk Prevention Plan  Transportation Screening Complete Complete Complete  Medication Review (RN Care Manager) Referral to Pharmacy Complete Complete  PCP or Specialist appointment within 3-5 days of discharge Complete Complete Complete  HRI or Home Care Consult Complete Complete Complete  SW Recovery Care/Counseling Consult Complete Complete Complete  Palliative Care Screening Not Applicable Not Applicable Not Applicable  Skilled Nursing Facility Not Applicable Not Applicable Not Applicable

## 2024-03-15 NOTE — Progress Notes (Signed)
 [facility] Progress Note   Subjective:   Per primary team, patient had refused SNF and will be going home. D/w renal navigator, will need to transfer back to his home HD clinic. Pt denies SOB, CP, dizziness  Objective Vitals:   03/14/24 1648 03/14/24 2053 03/15/24 0555 03/15/24 0941  BP: (!) 155/89 (!) 146/85 (!) 171/94 (!) 173/100  Pulse: 83 87 73 79  Resp: 18 19 19 18   Temp: 98.6 F (37 C) 98.3 F (36.8 C) 97.9 F (36.6 C) 98.3 F (36.8 C)  TempSrc:      SpO2: 100% 99% 96% 98%  Weight:      Height:       Physical Exam General: Adult male in NAD Heart: RRR, no murmurs, rubs or gallops Lungs: CTA bilaterally, respirations unlabored on RA Abdomen: Soft, non-distended, +BS Extremities: No edema noted, L TMA Dialysis Access:  R internal jugular [facility]  Additional Objective Labs: Basic Metabolic Panel: Recent Labs  Lab 03/11/24 0936 03/12/24 0633 03/14/24 0535  NA 137 138 138  K 5.1 4.6 5.1  CL 99 97* 102  CO2 23 20* 21*  GLUCOSE 151* 96 98  BUN 41* 50* 47*  CREATININE 8.44* 9.98* 9.17*  CALCIUM  8.8* 8.8* 9.0  PHOS  --  6.8* 6.3*   Liver Function Tests: Recent Labs  Lab 03/11/24 0936 03/12/24 0633 03/14/24 0535  AST 19  --   --   ALT 11  --   --   ALKPHOS 49  --   --   BILITOT 0.5  --   --   PROT 8.1  --   --   ALBUMIN  3.5 3.6 3.3*   No results for input(s): "LIPASE", "AMYLASE" in the last 168 hours. CBC: Recent Labs  Lab 03/09/24 1637 03/10/24 0233 03/11/24 0936 03/12/24 0936 03/15/24 0957  WBC 5.0 8.1 7.3 9.2 6.1  NEUTROABS 2.8  --  5.4 7.2  --   HGB 9.8* 9.3* 10.2* 9.8* 9.8*  HCT 31.7* 28.8* 32.4* 31.0* 30.8*  MCV 110.5* 109.1* 110.6* 109.2* 109.2*  PLT 137* 146* 135* 144* 141*   Blood Culture    Component Value Date/Time   SDES BLOOD BLOOD RIGHT HAND AEROBIC BOTTLE ONLY 02/09/2024 0414   SPECREQUEST  02/09/2024 0414    BOTTLES DRAWN AEROBIC ONLY Blood Culture adequate volume   CULT  02/09/2024 0414    NO GROWTH 5  DAYS Performed at [facility], [address] [place], [place] [address]    REPTSTATUS 02/14/2024 FINAL 02/09/2024 0414    Cardiac Enzymes: No results for input(s): "CKTOTAL", "CKMB", "CKMBINDEX", "TROPONINI" in the last 168 hours. CBG: Recent Labs  Lab 03/13/24 1628 03/14/24 0730 03/14/24 1138 03/14/24 1646 03/14/24 2058  GLUCAP 144* 89 141* 101* 121*   Iron  Studies:  Recent Labs    03/12/24 1508  IRON  28*  TIBC 185*  FERRITIN 424*   @lablastinr3 @ Studies/Results: No results found. Medications:  ceFEPime  (MAXIPIME ) IV     ceFEPime  (MAXIPIME ) IV Stopped (03/14/24 1316)   promethazine  (PHENERGAN ) injection (IM or IVPB) Stopped (03/14/24 0738)   vancomycin  Stopped (03/14/24 1316)   vancomycin       apixaban   5 mg Oral BID   vitamin C   1,000 mg Oral Daily   atorvastatin   80 mg Oral Daily   bethanechol   10 mg Oral BID   Chlorhexidine  Gluconate Cloth  6 each Topical Q0600   Chlorhexidine  Gluconate Cloth  6 each Topical Q0600   darbepoetin (ARANESP ) injection - DIALYSIS  100 mcg Subcutaneous Q Sat-1800   lactulose   30 g Oral Daily   levothyroxine   50 mcg Oral QAC breakfast   midodrine   5 mg Oral TID WC   multivitamin  1 tablet Oral QHS   naloxegol  oxalate  12.5 mg Oral Daily   nutrition supplement (JUVEN)  1 packet Oral BID BM   oxyCODONE   30 mg Oral Q8H   polyethylene glycol  17 g Oral BID   senna-docusate  4 tablet Oral BID   sevelamer  carbonate  1,600 mg Oral TID WC   tamsulosin   0.4 mg Oral Daily    Dialysis Orders: TTS [facility] previously (not he will be going to [place]) 4h  B400  92kg  TDC  Heparin  none - Mircera 225mcg IV q 2 weeks (last 4/9) - Calcitriol  0.43mcg PO q HD  Assessment/Plan: 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 CT imaging showed T9-10 discitis. He refused MRI, area was too small for aspiration per IR. H/o of foot infection as below. Blood Cxs were negative. Not an operative candidate per neurosurgery.   Per ID  continue IV Vanc 750mg  and IV Cefepime  2gm with HD x 6 weeks thru 03/23/24 for the discitis.  Left foot osteomyelitis - completed an 8 week course of Dapto/Cefepime  on 3/28 for bacteremia presumed due to foot infection. S/p left TMA on 4/30. Antibiotics as above.  Hyperkalemia - continue renal diet.improved  ESRD:   Transitioned to MWF schedule for HD to align with his outpatient schedule (note he will now be MWF [place] given the SNF plans) HD rolled over to today due to high census Now going back to [place], will find out what his schedule will be Hypotension: started on midodrine  this admission due to hypotension.  He has a history of HTN and is off of multiple anti-hypertensives.  We have been doing little to no UF with HD- having some orthopnea so will try 0.5kg if BP tolerates.  He was hypertensive on midodrine  10 mg TID so we reduced midodrine  to 5 mg TID. BP elevated today, follow post HD Anemia of CKD: Continue aranesp  100 mcg every Saturday. No venofer in setting of infection Metabolic bone disease: hyperphos - continue home binders and encourage HD adherence Nutrition: prot supp for low albumin     Disposition - per primary team. He has been recommended for SNF placement but refused, reportedly discharging home today.  Ramona Burner, PA-C 03/15/2024, 11:09 AM  [facility] Pager: [phone]

## 2024-03-15 NOTE — Procedures (Signed)
 HD Note:  Some information was entered later than the data was gathered due to patient care needs. The stated time with the data is accurate.  Received patient in bed to unit.   Alert and oriented.  Very talkative with the need to have attention to feel comfortable. Patient complaint of pain, Medication given, see MAR  Informed consent signed and in chart.   Access used: Upper right chest HD catheter Access issues: no issues  Patient tolerated treatment well. Labs resulted with a K+ 6.4. Dr. Zana Hesselbach notified and new order to use 1 K bath for 90 min during treatment.  TX duration: 3.5 hours  Alert, without acute distress.  Total UF removed: 500 ml  Hand-off given to patient's nurse.   Transported back to the room   [name] [name], RN Kidney Dialysis Unit.

## 2024-03-16 ENCOUNTER — Telehealth: Payer: Self-pay

## 2024-03-16 DIAGNOSIS — M4624 Osteomyelitis of vertebra, thoracic region: Secondary | ICD-10-CM | POA: Diagnosis not present

## 2024-03-16 LAB — CBC WITH DIFFERENTIAL/PLATELET
Abs Immature Granulocytes: 0.02 10*3/uL (ref 0.00–0.07)
Basophils Absolute: 0.1 10*3/uL (ref 0.0–0.1)
Basophils Relative: 1 %
Eosinophils Absolute: 0.4 10*3/uL (ref 0.0–0.5)
Eosinophils Relative: 7 %
HCT: 28.8 % — ABNORMAL LOW (ref 39.0–52.0)
Hemoglobin: 9.1 g/dL — ABNORMAL LOW (ref 13.0–17.0)
Immature Granulocytes: 0 %
Lymphocytes Relative: 24 %
Lymphs Abs: 1.4 10*3/uL (ref 0.7–4.0)
MCH: 34.2 pg — ABNORMAL HIGH (ref 26.0–34.0)
MCHC: 31.6 g/dL (ref 30.0–36.0)
MCV: 108.3 fL — ABNORMAL HIGH (ref 80.0–100.0)
Monocytes Absolute: 0.6 10*3/uL (ref 0.1–1.0)
Monocytes Relative: 11 %
Neutro Abs: 3.3 10*3/uL (ref 1.7–7.7)
Neutrophils Relative %: 57 %
Platelets: 153 10*3/uL (ref 150–400)
RBC: 2.66 MIL/uL — ABNORMAL LOW (ref 4.22–5.81)
RDW: 15.4 % (ref 11.5–15.5)
WBC: 5.7 10*3/uL (ref 4.0–10.5)
nRBC: 0 % (ref 0.0–0.2)

## 2024-03-16 LAB — RENAL FUNCTION PANEL
Albumin: 3 g/dL — ABNORMAL LOW (ref 3.5–5.0)
Anion gap: 13 (ref 5–15)
BUN: 34 mg/dL — ABNORMAL HIGH (ref 8–23)
CO2: 23 mmol/L (ref 22–32)
Calcium: 8.3 mg/dL — ABNORMAL LOW (ref 8.9–10.3)
Chloride: 97 mmol/L — ABNORMAL LOW (ref 98–111)
Creatinine, Ser: 6.86 mg/dL — ABNORMAL HIGH (ref 0.61–1.24)
GFR, Estimated: 8 mL/min — ABNORMAL LOW (ref >60)
Glucose, Bld: 75 mg/dL (ref 70–99)
Phosphorus: 7.2 mg/dL — ABNORMAL HIGH (ref 2.5–4.6)
Potassium: 4.9 mmol/L (ref 3.5–5.1)
Sodium: 133 mmol/L — ABNORMAL LOW (ref 135–145)

## 2024-03-16 LAB — SEDIMENTATION RATE: Sed Rate: 120 mm/h — ABNORMAL HIGH (ref 0–16)

## 2024-03-16 LAB — GLUCOSE, CAPILLARY
Glucose-Capillary: 84 mg/dL (ref 70–99)
Glucose-Capillary: 84 mg/dL (ref 70–99)
Glucose-Capillary: 148 mg/dL — ABNORMAL HIGH (ref 70–99)
Glucose-Capillary: 105 mg/dL — ABNORMAL HIGH (ref 70–99)

## 2024-03-16 LAB — C-REACTIVE PROTEIN: CRP: 1 mg/dL — ABNORMAL HIGH (ref <1.0)

## 2024-03-16 MED ORDER — PENTAFLUOROPROP-TETRAFLUOROETH EX AERO: 1.0000 | INHALATION_SPRAY | CUTANEOUS | Status: DC | PRN

## 2024-03-16 MED ORDER — LIDOCAINE-PRILOCAINE 2.5-2.5 % EX CREA: 1.0000 | TOPICAL_CREAM | CUTANEOUS | Status: DC | PRN

## 2024-03-16 MED ORDER — HEPARIN SODIUM (PORCINE) 1000 UNIT/ML DIALYSIS
1000.0000 [IU] | INTRAMUSCULAR | Status: DC | PRN
  Administered 2024-03-16: 1000 [IU]

## 2024-03-16 MED ORDER — MIDODRINE HCL 5 MG PO TABS
ORAL_TABLET | ORAL | Status: AC
  Filled 2024-03-16: qty 1

## 2024-03-16 MED ORDER — ALTEPLASE 2 MG IJ SOLR: 2.0000 mg | Freq: Once | INTRAMUSCULAR | Status: DC | PRN

## 2024-03-16 MED ORDER — ANTICOAGULANT SODIUM CITRATE 4% (200MG/5ML) IV SOLN: 5.0000 mL | Status: DC | PRN

## 2024-03-16 MED ORDER — LIDOCAINE HCL (PF) 1 % IJ SOLN: 5.0000 mL | INTRAMUSCULAR | Status: DC | PRN

## 2024-03-16 MED ORDER — VANCOMYCIN IV (FOR PTA / DISCHARGE USE ONLY): 750.0000 mg | INTRAVENOUS | Status: AC

## 2024-03-16 MED ORDER — ANTICOAGULANT SODIUM CITRATE 4% (200MG/5ML) IV SOLN
5.0000 mL | Status: DC | PRN
Start: 2024-03-16 — End: 2024-03-16

## 2024-03-16 MED ORDER — HEPARIN SODIUM (PORCINE) 1000 UNIT/ML DIALYSIS: 1000.0000 [IU] | INTRAMUSCULAR | Status: DC | PRN

## 2024-03-16 MED ORDER — LIDOCAINE-PRILOCAINE 2.5-2.5 % EX CREA
1.0000 | TOPICAL_CREAM | CUTANEOUS | Status: DC | PRN
Start: 2024-03-16 — End: 2024-03-16

## 2024-03-16 MED ORDER — OXYCONTIN 30 MG PO T12A: 30.0000 mg | EXTENDED_RELEASE_TABLET | Freq: Three times a day (TID) | ORAL | 0 refills | Status: DC | PRN

## 2024-03-16 MED ORDER — METOCLOPRAMIDE HCL 5 MG/ML IJ SOLN
10.0000 mg | Freq: Once | INTRAMUSCULAR | Status: AC
  Administered 2024-03-16: 10 mg via INTRAVENOUS
  Filled 2024-03-16: qty 2

## 2024-03-16 MED ORDER — CEFEPIME IV (FOR PTA / DISCHARGE USE ONLY): 2.0000 g | INTRAVENOUS | Status: AC

## 2024-03-16 MED ORDER — PROCHLORPERAZINE EDISYLATE 10 MG/2ML IJ SOLN: 10.0000 mg | Freq: Four times a day (QID) | INTRAMUSCULAR | Status: DC | PRN

## 2024-03-16 MED ORDER — NEPRO/CARBSTEADY PO LIQD: 237.0000 mL | ORAL | Status: DC | PRN

## 2024-03-16 MED ORDER — HEPARIN SODIUM (PORCINE) 1000 UNIT/ML DIALYSIS
1000.0000 [IU] | INTRAMUSCULAR | Status: DC | PRN
Start: 2024-03-16 — End: 2024-03-16

## 2024-03-16 MED ORDER — ALTEPLASE 2 MG IJ SOLR
2.0000 mg | Freq: Once | INTRAMUSCULAR | Status: DC | PRN
Start: 2024-03-16 — End: 2024-03-16

## 2024-03-16 MED ORDER — HYDROMORPHONE HCL 1 MG/ML IJ SOLN
INTRAMUSCULAR | Status: AC
  Filled 2024-03-16: qty 1

## 2024-03-16 MED ORDER — VANCOMYCIN HCL 750 MG/150ML IV SOLN
INTRAVENOUS | Status: AC
  Filled 2024-03-16: qty 150

## 2024-03-16 MED ORDER — HEPARIN SODIUM (PORCINE) 1000 UNIT/ML IJ SOLN
INTRAMUSCULAR | Status: AC
  Filled 2024-03-16: qty 4

## 2024-03-16 NOTE — Progress Notes (Signed)
 PT Cancellation Note  Patient Details Name: Thomas [name] MRN: [id number] DOB: [birth year]   Cancelled Treatment:    Reason Eval/Treat Not Completed: (P) Patient at procedure or test/unavailable. Pt at HD. Will plan to follow-up as time permits.  Vernida Goodie, PT, DPT Acute Rehabilitation Services  Office: [phone]    Thomas [name] 03/16/2024, 10:43 AM

## 2024-03-16 NOTE — Progress Notes (Signed)
 PHARMACY CONSULT NOTE FOR:   OUTPATIENT  PARENTERAL ANTIBIOTIC THERAPY (OPAT)   Informational as the patient will receive antibiotics with hemodialysis outpatient   Indication: Thoracic discitis Regimen: Vancomycin  1g/HD-TTS + Cefepime  2g/HD-TTS End date: 04/06/24      Thank you for allowing pharmacy to be a part of this patient's care.  Denson Flake, PharmD, BCPS, BCIDP Infectious Diseases Clinical Pharmacist Phone: [phone] 03/16/2024 3:34 PM

## 2024-03-16 NOTE — Progress Notes (Signed)
   03/16/24 1412  Vitals  Temp 97.8 F (36.6 C)  Pulse Rate 72  Resp 16  BP 127/78  SpO2 97 %  O2 Device Room Air  Weight (S)  91.9 kg (Bed Scale)  Type of Weight Post-Dialysis  Oxygen Therapy  Patient Activity (if Appropriate) In bed  Pulse Oximetry Type Continuous  Post Treatment  Dialyzer Clearance Clear  Hemodialysis Intake (mL) 0 mL  Liters Processed 72  Fluid Removed (mL) 1000 mL  Tolerated HD Treatment Yes  Post-Hemodialysis Comments Tx. completed without complication. Pt. voice no complaints and report call to 9M bedside RN. Admin medication per. order   Received patient in bed to unit.  Alert and oriented.  Informed consent signed and in chart.   TX duration: 3  Patient tolerated well.  Transported back to the room  Alert, without acute distress.  Hand-off given to patient's nurse.   Access used: Yes Access issues: No  Total UF removed: 1000 Medication(s) given: See MAR Post HD VS: See Above Grid Post HD weight: 91.9 kg   Thomas [name] [facility]ney Dialysis [facility]

## 2024-03-16 NOTE — Progress Notes (Addendum)
 Pt to d/c to snf today. Contacted [facility] to be advised of pt's d/c today after HD and that pt should start on Friday as planned. Clinic aware of pt's iv abx needs with HD and renal PA sent orders to clinic yesterday. HD arrangements placed on pt's AVS and requested that CSW please remind snf to please send hoyer pad/sling with pt to HD appts.   Lauraine Polite Renal Navigator [phone]  Addendum at 3:20 pm: Advised by [facility] that pt's appt time needs to be changed. Pt will be MWF with 11:30 am chair time. Pt will need to arrive at 10:45 am on Friday to complete paperwork prior to treatment.This info provided to CSW to provide to snf and info on AVS updated as well.

## 2024-03-16 NOTE — Progress Notes (Signed)
 Report called to [facility] RN @ [facility] at [phone].  Her direct number is [phone].  All questions addressed.  Patient is going to Room 215.  Necia Bali RN

## 2024-03-16 NOTE — Care Management Important Message (Signed)
 Important Message  Patient Details  Name: Thomas [name] MRN: [id number] Date of Birth: November 01, 1950   Important Message Given:  Yes - Medicare IM     Wynonia Hedges 03/16/2024, 5:02 PM

## 2024-03-16 NOTE — Discharge Summary (Addendum)
 Physician Discharge Summary   Patient: Thomas [name] MRN: [id number] DOB: 1951-09-17  Admit date:     02/07/2024  Discharge date: 03/16/24  Discharge Physician: Charlean Congress  PCP: Wright Heal, MD  Recommendations at discharge: Follow up with Orthopedics and ID as recommended  Follow up with PCP for pain meds.    Contact information for follow-up providers     Timothy Ford, MD Follow up in 1 week(s).   Specialty: Orthopedic Surgery Contact information: [address] [place]  [place] [address] [phone]         [facility], [facility]. Go on 03/18/2024.   Why: Schedule is Monday, Wednesday, Friday with 11:30 am chair time.  Please arrive at 10:45 am for first appointment to complete paperwork prior to treatment. Please send hoyer pad/sling under pt to HD appointments. Contact information: [address] [place] [address] [phone]         [facility] Follow up.   Specialty: Home Health Services Why: Someone will call you to schedule first home visit. Contact information: [address] [place] [address] [phone]         Liane Redman, MD. Go to.   Specialty: Infectious Diseases Why: the Appointment As Scheduled Contact information: [address] [place] [address] [phone]         Agustina Aldrich, MD. Call.   Specialty: Neurosurgery Why: As needed Contact information: [address] [place] [address] [phone]         [facility]. Schedule an appointment as soon as possible for a visit in 2 month(s).   Contact information: [address] [place] [address] [place]  [address] [phone]             Contact information for after-discharge care     Destination     [facility] Preferred SNF .   Service: Skilled Nursing Contact information: [address] [place]    [phone]                    Discharge Diagnoses: Principal Problem:   Osteomyelitis of thoracic spine (HCC) Active Problems:   DM2 (diabetes mellitus, type 2) (HCC)   Essential hypertension   Acquired hypothyroidism   Acute low back pain   Fall   End stage renal disease on dialysis (HCC)   History of anemia due to chronic kidney disease   Acute midline thoracic back pain   Subacute osteomyelitis of left foot (HCC)   Malnutrition of moderate degree  Hospital Course: 73 year old male with PMH of DVT PE with IVC filter, previously on Coumadin  BPH severe right hydroureteronephrosis, chronic Systolic diastolic heart failure EF 35-40%, ESRD TTS, BKA right side, 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 He was deemed to not be a neurosurgical candidate and has been managed medically with antibiotics. He was taken on 4/28 by Dr. Julio Ohm for left transmetatarsal amputation. PCCM was called to evaluate the patient on 5/22 for hypotension. Now blood pressure is stable and pt is ready for transfer to SNF.  4/21>> x-ray right elbow: No fracture. 4/21>> x-ray left wrist: No fracture 4/21>> x-ray left shoulder, no fracture. 4/21>> CT head: No acute intracranial abnormality 4/21>> CT C-spine: No  fracture. 4/21>> CT abdomen/pelvis: No acute injury to chest/abdomen/pelvis 4/21>> CT T-spine: Prior posterior fusion from T10-T12-abnormal appearance with collapse of T10-lucency around T10 screws at the T9-10 disc space, T10-11 disc space concerning for discitis/osteomyelitis. 4/21>> CT L-spine: No acute bony abnormality, mild chronic compression fracture at L1. 4/22>> x-ray left foot: Chronic erosive changes about the forefoot similar to 11/21/2023-osteomyelitis/septic  arthritis of the 1st/2nd MTP joints difficult to exclude. 4/25>> left ABI: Within normal limit. 5/22 was sent to ICU because of hypotension?  Received pain meds systolic blood pressures in 50s and 60s placed on Levophed-he was transferred back out to the floor the day after and we resumed care   Procedures: 4/30>> left transmetatarsal amputation by Dr. Julio Ohm.  Assessment and plan  Thoracic osteomyelitis discitis T9-T10 Osteomyelitis left foot -underwent TMA on 4/30  Per neurosurgery not an operative candidate Per IR-no window to aspirate ID consulted, appreciate help. Plan is for 8 weeks of vancomycin /cefepime  with hemodialysis-EOT 04/06/24 Blood cultures negative so far Back pain better Continue to wear TLSO brace when out of the bed. Note patient is on high doses of narcotics chronically, counseled to avoid excessive use to avoid accidental overdose, as needed Narcan  on board.  Hypotension Etiology unclear Bp normal now  Continue midodrine  Hold antihypertensive meds   ESRD dialyzes usually TTS in [place] Hyperkalemia Potassium normal. Hypotension possibly secondary to trying to challenge EDW transiently on pressors.  approved for [employer] on MWF 11:15 am chair time.    BPH  Severe chronic right hydroureteronephrosis Continue Flomax  0.4, bethanechol  10 twice daily Outpt follow up with urology as recommended    Left transmetatarsal amputation for osteomyelitis 4/30 Follow up with Dr Julio Ohm as recommended    Previous history of DVT PE with recurrence in 2023 Previously on Coumadin  now off the same for unclear reason apparently has IVC filter in place Now on Eliquis  5 bid   Chronic HFpEF Now managed with dialysis-BP meds as above  Pain control - [place]  Controlled Substance Reporting System database was reviewed. and patient was instructed, not to drive, operate heavy machinery, perform activities at heights, swimming or participation in water activities or provide  baby-sitting services while on Pain, Sleep and Anxiety Medications; until their outpatient Physician has advised to do so again. Also recommended to not to take more than prescribed Pain, Sleep and Anxiety Medications.  Consultants:  PCCM  Nephrology  Orthopedics  ID Neurosurgery   Procedures performed:  AMPUTATION, left transmetatarsal amputation.  HD  DISCHARGE MEDICATION: Allergies as of 03/16/2024       Reactions   Other Other (See Comments)   Blood pressure issues Per [facility] hospital: Pt states he can only take these pain meds or else he gets very sick: Oxycontin ,morphine ,demerol, and dilaudid  are the only pain meds pt states he can take.    Oxymorphone Other (See Comments)   Causes kidney problems   Amitriptyline Other (See Comments)   Aspirin Nausea And Vomiting   Gi upset, has kidney issues   Beta Vulgaris Nausea And Vomiting   Buspirone Other (See Comments)   Affected his head   Cabbage Nausea And Vomiting   Codeine Other (See Comments)   Pt does not remember the reaction but knows he can not take   Cyclobenzaprine Nausea And Vomiting   Diflunisal Nausea And Vomiting, Other (See Comments)   Fish Allergy Nausea And Vomiting   Fish-derived Products Nausea And Vomiting   Hydroxyzine Nausea And Vomiting   Levetiracetam Other (See Comments)  Methadone Other (See Comments)   Made him loopy   Nalbuphine Other (See Comments)   Cutaneous eruption (morphologic abnormality)   Pentazocine Other (See Comments)   Pt does not remember the reaction    Propoxyphene Other (See Comments)   Pt does not remember the reaction   Shellfish Allergy Nausea And Vomiting   Sulfa Antibiotics Hives   Sulfasalazine Other (See Comments)   Pt does not remember the reaction   Amoxicillin  Nausea And Vomiting, Rash   Per [facility]        Medication List     STOP taking these medications    amLODipine  10 MG tablet Commonly known as: NORVASC    cloNIDine  0.1 MG  tablet Commonly known as: CATAPRES    hydrALAZINE  25 MG tablet Commonly known as: APRESOLINE    Lasix  40 MG tablet Generic drug: furosemide    losartan  50 MG tablet Commonly known as: COZAAR    magnesium  oxide 400 MG tablet Commonly known as: MAG-OX   metoprolol  tartrate 50 MG tablet Commonly known as: LOPRESSOR    warfarin 10 MG tablet Commonly known as: COUMADIN        TAKE these medications    apixaban  5 MG Tabs tablet Commonly known as: ELIQUIS  Take 1 tablet (5 mg total) by mouth 2 (two) times daily.   atorvastatin  80 MG tablet Commonly known as: LIPITOR  Take 80 mg by mouth daily.   bethanechol  10 MG tablet Commonly known as: URECHOLINE  Take 10 mg by mouth 2 (two) times daily.   bisacodyl  10 MG suppository Commonly known as: DULCOLAX Place 1 suppository (10 mg total) rectally daily as needed for moderate constipation.   ceFEPime  IVPB Commonly known as: MAXIPIME  Inject 2 g into the vein Every Tuesday,Thursday,and Saturday with dialysis. Indication:  Discitis/osteo Last Day of Therapy:  04/06/24 Labs - Once weekly:  CBC/D and BMP, ESR and CRP Method of administration: Per hemodialysis protocol, to be given at HD center= Start taking on: Mar 17, 2024   Cholecalciferol  125 MCG (5000 UT) capsule Take 5,000 Units by mouth daily.   cyanocobalamin  1000 MCG tablet Commonly known as: VITAMIN B12 Take 1 tablet by mouth daily.   lactulose  10 GM/15ML solution Commonly known as: CHRONULAC  Take 45 mLs (30 g total) by mouth daily.   levothyroxine  50 MCG tablet Commonly known as: SYNTHROID  Take 50 mcg by mouth daily before breakfast.   midodrine  5 MG tablet Commonly known as: PROAMATINE  Take 1 tablet (5 mg total) by mouth 3 (three) times daily with meals.   OxyCONTIN  30 MG 12 hr tablet Generic drug: oxyCODONE  Take 30 mg by mouth 3 (three) times daily as needed (pain).   senna-docusate 8.6-50 MG tablet Commonly known as: Senokot-S Take 4 tablets by mouth 2 (two)  times daily.   sevelamer  800 MG tablet Commonly known as: RENAGEL  Take 1,600 mg by mouth 3 (three) times daily.   tamsulosin  0.4 MG Caps capsule Commonly known as: FLOMAX  Take 1 capsule (0.4 mg total) by mouth daily.   tiZANidine  4 MG tablet Commonly known as: ZANAFLEX  Take 4 mg by mouth every 8 (eight) hours as needed for muscle spasms.   vancomycin  IVPB Inject 750 mg into the vein Every Tuesday,Thursday,and Saturday with dialysis. Indication:  Discitis/osteo Last Day of Therapy:  04/06/24 Labs - Once weekly:  CBC/D, BMP, and pre-HD Vanc level (goal 15-25 mcg/ml), ESR and CRP Method of administration: Per hemodialysis protocol - to be given at HD center Start taking on: Mar 17, 2024   Vitamin E 670 MG (  1000 UT) Caps Take 1,000 mg by mouth daily.               Home Infusion Instuctions  (From admission, onward)           Start     Ordered   02/11/24 0000  Home infusion instructions       Comments: To be given at the HD center  Question:  Instructions  Answer:  Flushing of vascular access device: 0.9% NaCl pre/post medication administration and prn patency; Heparin  100 u/ml, 5ml for implanted ports and Heparin  10u/ml, 5ml for all other central venous catheters.   02/11/24 0747           Disposition: SNF Diet recommendation: Renal diet  Discharge Exam: Vitals:   03/16/24 1138 03/16/24 1208 03/16/24 1412 03/16/24 1453  BP: 109/65 90/61 127/78 119/89  Pulse: 78 74 72 69  Resp: 14 13 16 16   Temp:   97.8 F (36.6 C) 98.7 F (37.1 C)  TempSrc:    Oral  SpO2: 99% 96% 97% 97%  Weight:   (S) 91.9 kg   Height:       General: Appear in mild distress; no visible Abnormal Neck Mass Or lumps, Conjunctiva normal Cardiovascular: S1 and S2 Present, no Murmur, Respiratory: good respiratory effort, Bilateral Air entry present and CTA, no Crackles, no wheezes Abdomen: Bowel Sound present, Non tender  Extremities: L TMA  Neurology: alert and oriented to time, place,  and person  Filed Weights   03/16/24 0501 03/16/24 1041 03/16/24 1412  Weight: 96.1 kg (S) 92.5 kg (S) 91.9 kg   Condition at discharge: stable  The results of significant diagnostics from this hospitalization (including imaging, microbiology, ancillary and laboratory) are listed below for reference.   Imaging Studies: ECHOCARDIOGRAM COMPLETE Result Date: 03/10/2024    ECHOCARDIOGRAM REPORT   Patient Name:   Thomas [name] Date of Exam: 03/10/2024 Medical Rec #:  [id number]       Height:       77.0 in Accession #:    [id number]      Weight:       185.8 lb Date of Birth:  [birth year]       BSA:          2.168 m Patient Age:    72 years        BP:           110/67 mmHg Patient Gender: M               HR:           62 bpm. Exam Location:  Inpatient Procedure: 2D Echo, Cardiac Doppler and Color Doppler (Both Spectral and Color            Flow Doppler were utilized during procedure). Indications:    Shock  History:        Patient has prior history of Echocardiogram examinations, most                 recent 12/19/2021. Abnormal ECG, Arrythmias:SVT,                 Signs/Symptoms:Chest Pain; Risk Factors:Hypertension and                 Diabetes. ESRD. Pulmonary embolus.  Sonographer:    Raynelle Callow RDCS Referring Phys: [id number] [facility]  Sonographer Comments: Technically difficult study due to poor echo windows. Patient windows very high. IMPRESSIONS  1. Left ventricular ejection fraction,  by estimation, is 50 to 55%. The left ventricle has low normal function. The left ventricle has no regional wall motion abnormalities. Left ventricular diastolic parameters are consistent with Grade I diastolic dysfunction (impaired relaxation). There is the interventricular septum is flattened in systole and diastole, consistent with right ventricular pressure and volume overload.  2. Right ventricular systolic function is moderately reduced. The right ventricular size is moderately enlarged.  3. Right atrial size was moderately  dilated.  4. The mitral valve is normal in structure. Mild mitral valve regurgitation. No evidence of mitral stenosis.  5. The aortic valve is tricuspid. There is moderate calcification of the aortic valve. Aortic valve regurgitation is not visualized. No aortic stenosis is present.  6. There is dilatation of the ascending aorta, measuring 41 mm.  7. The inferior vena cava is normal in size with greater than 50% respiratory variability, suggesting right atrial pressure of 3 mmHg. FINDINGS  Left Ventricle: Left ventricular ejection fraction, by estimation, is 50 to 55%. The left ventricle has low normal function. The left ventricle has no regional wall motion abnormalities. The left ventricular internal cavity size was normal in size. There is no left ventricular hypertrophy. The interventricular septum is flattened in systole and diastole, consistent with right ventricular pressure and volume overload. Left ventricular diastolic parameters are consistent with Grade I diastolic dysfunction (impaired relaxation). Right Ventricle: The right ventricular size is moderately enlarged. No increase in right ventricular wall thickness. Right ventricular systolic function is moderately reduced. Left Atrium: Left atrial size was normal in size. Right Atrium: Right atrial size was moderately dilated. Pericardium: There is no evidence of pericardial effusion. Mitral Valve: The mitral valve is normal in structure. Mild mitral valve regurgitation. No evidence of mitral valve stenosis. Tricuspid Valve: The tricuspid valve is normal in structure. Tricuspid valve regurgitation is not demonstrated. No evidence of tricuspid stenosis. Aortic Valve: The aortic valve is tricuspid. There is moderate calcification of the aortic valve. Aortic valve regurgitation is not visualized. No aortic stenosis is present. Pulmonic Valve: The pulmonic valve was normal in structure. Pulmonic valve regurgitation is not visualized. No evidence of pulmonic  stenosis. Aorta: The aortic root is normal in size and structure. There is dilatation of the ascending aorta, measuring 41 mm. Venous: The inferior vena cava is normal in size with greater than 50% respiratory variability, suggesting right atrial pressure of 3 mmHg. IAS/Shunts: The interatrial septum was not well visualized.  LEFT VENTRICLE PLAX 2D LVIDd:         5.50 cm      Diastology LVIDs:         3.60 cm      LV e' medial:    6.53 cm/s LV PW:         1.00 cm      LV E/e' medial:  9.1 LV IVS:        0.90 cm      LV e' lateral:   11.10 cm/s LVOT diam:     2.90 cm      LV E/e' lateral: 5.3 LV SV:         110 LV SV Index:   51 LVOT Area:     6.61 cm  LV Volumes (MOD) LV vol d, MOD A2C: 121.0 ml LV vol d, MOD A4C: 78.4 ml LV vol s, MOD A2C: 48.1 ml LV vol s, MOD A4C: 34.5 ml LV SV MOD A2C:     72.9 ml LV SV MOD A4C:  78.4 ml LV SV MOD BP:      57.5 ml RIGHT VENTRICLE             IVC RV S prime:     11.60 cm/s  IVC diam: 1.60 cm TAPSE (M-mode): 1.7 cm LEFT ATRIUM             Index        RIGHT ATRIUM           Index LA diam:        3.70 cm 1.71 cm/m   RA Area:     25.00 cm LA Vol (A2C):   80.9 ml 37.31 ml/m  RA Volume:   88.10 ml  40.63 ml/m LA Vol (A4C):   56.0 ml 25.83 ml/m LA Biplane Vol: 69.7 ml 32.15 ml/m  AORTIC VALVE LVOT Vmax:   74.90 cm/s LVOT Vmean:  46.100 cm/s LVOT VTI:    0.166 m  AORTA Ao Root diam: 3.60 cm Ao Asc diam:  4.10 cm MITRAL VALVE MV Area (PHT): 2.39 cm    SHUNTS MV Decel Time: 317 msec    Systemic VTI:  0.17 m MV E velocity: 59.20 cm/s  Systemic Diam: 2.90 cm MV A velocity: 79.70 cm/s MV E/A ratio:  0.74 Aditya Sabharwal Electronically signed by Alwin Baars Signature Date/Time: 03/10/2024/5:56:29 PM    Final    DG CHEST PORT 1 VIEW Result Date: 03/06/2024 CLINICAL DATA:  [id number].  Dyspnea. EXAM: PORTABLE CHEST 1 VIEW COMPARISON:  PA and lateral chest 01/10/2024 FINDINGS: Right IJ dialysis catheter again terminates at the superior cavoatrial junction. Median sternotomy  sutures are again noted and partially visible lower thoracic spinal hardware. The lungs are expiratory. There are chronic interstitial changes. No focal pneumonia is seen. The sulci are sharp. Cardiomegaly. No vascular congestion is seen. Stable mediastinum with aortic tortuosity and atherosclerosis. Osteopenia. IMPRESSION: 1. Expiratory chest with chronic interstitial changes. No evidence of acute chest disease. 2. Cardiomegaly. 3. Aortic atherosclerosis. Electronically Signed   By: Denman Fischer M.D.   On: 03/06/2024 01:45    Microbiology: Results for orders placed or performed during the hospital encounter of 02/07/24  Culture, blood (Routine X 2) w Reflex to ID Panel     Status: None   Collection Time: 02/09/24  4:06 AM   Specimen: BLOOD  Result Value Ref Range Status   Specimen Description BLOOD BLOOD RIGHT HAND  Final   Special Requests   Final    BOTTLES DRAWN AEROBIC ONLY Blood Culture adequate volume   Culture   Final    NO GROWTH 5 DAYS Performed at [facility], [address] [place], [place] [address]    Report Status 02/14/2024 FINAL  Final  Culture, blood (Routine X 2) w Reflex to ID Panel     Status: None   Collection Time: 02/09/24  4:14 AM   Specimen: BLOOD  Result Value Ref Range Status   Specimen Description BLOOD BLOOD RIGHT HAND AEROBIC BOTTLE ONLY  Final   Special Requests   Final    BOTTLES DRAWN AEROBIC ONLY Blood Culture adequate volume   Culture   Final    NO GROWTH 5 DAYS Performed at [facility], [address], [place], [place] [address]    Report Status 02/14/2024 FINAL  Final  MRSA Next Gen by PCR, Nasal     Status: None   Collection Time: 03/10/24  8:39 AM   Specimen: Nasal Mucosa; Nasal Swab  Result Value Ref Range  Status   MRSA by PCR Next Gen NOT DETECTED NOT DETECTED Final    Comment: (NOTE) The GeneXpert MRSA Assay (FDA approved for NASAL specimens only), is one component of a comprehensive MRSA colonization  surveillance program. It is not intended to diagnose MRSA infection nor to guide or monitor treatment for MRSA infections. Test performance is not FDA approved in patients less than 33 years old. Performed at [facility], [address] [place], [place] [address]    Labs: CBC: Recent Labs  Lab 03/09/24 1637 03/10/24 0233 03/11/24 0936 03/12/24 0936 03/15/24 0957 03/16/24 0805  WBC 5.0 8.1 7.3 9.2 6.1 5.7  NEUTROABS 2.8  --  5.4 7.2  --  3.3  HGB 9.8* 9.3* 10.2* 9.8* 9.8* 9.1*  HCT 31.7* 28.8* 32.4* 31.0* 30.8* 28.8*  MCV 110.5* 109.1* 110.6* 109.2* 109.2* 108.3*  PLT 137* 146* 135* 144* 141* 153   Basic Metabolic Panel: Recent Labs  Lab 03/11/24 0936 03/12/24 0633 03/14/24 0535 03/15/24 0957 03/16/24 0804  NA 137 138 138 139 133*  K 5.1 4.6 5.1 6.4* 4.9  CL 99 97* 102 103 97*  CO2 23 20* 21* 20* 23  GLUCOSE 151* 96 98 89 75  BUN 41* 50* 47* 59* 34*  CREATININE 8.44* 9.98* 9.17* 10.81* 6.86*  CALCIUM  8.8* 8.8* 9.0 8.8* 8.3*  PHOS  --  6.8* 6.3* 7.5* 7.2*   Liver Function Tests: Recent Labs  Lab 03/11/24 0936 03/12/24 0633 03/14/24 0535 03/15/24 0957 03/16/24 0804  AST 19  --   --   --   --   ALT 11  --   --   --   --   ALKPHOS 49  --   --   --   --   BILITOT 0.5  --   --   --   --   PROT 8.1  --   --   --   --   ALBUMIN  3.5 3.6 3.3* 3.4* 3.0*   CBG: Recent Labs  Lab 03/14/24 2058 03/15/24 1120 03/15/24 2022 03/16/24 0744 03/16/24 1446  GLUCAP 121* 95 98 84 84    Discharge time spent: greater than 30 minutes.  Author: Charlean Congress, MD  [facility]

## 2024-03-16 NOTE — Progress Notes (Signed)
 [facility] Progress Note   Subjective:   Dispo plan changed several times yesterday, now pt is agreeing to SNF. Pt reports back pain today. Denies SOB, CP, dizziness. Discussed plan for HD this AM then d/c to SNF, pt is still agreeable.   Objective Vitals:   03/15/24 1745 03/15/24 2020 03/16/24 0501 03/16/24 0828  BP: (!) 129/97 (!) 167/97 135/86 (!) 149/83  Pulse: 92 77 65 69  Resp: 17 17 17 15   Temp:  98.3 F (36.8 C) 98.6 F (37 C) 98.4 F (36.9 C)  TempSrc:  Oral Oral Oral  SpO2: 96% 100% 99% 100%  Weight: 91.6 kg  96.1 kg   Height:       Physical Exam  General: Adult male in NAD Heart: RRR, no murmurs, rubs or gallops Lungs: CTA bilaterally, respirations unlabored on RA Abdomen: Soft, non-distended, +BS Extremities: No edema noted, L TMA Dialysis Access:  R internal jugular [facility]  Additional Objective Labs: Basic Metabolic Panel: Recent Labs  Lab 03/14/24 0535 03/15/24 0957 03/16/24 0804  NA 138 139 133*  K 5.1 6.4* 4.9  CL 102 103 97*  CO2 21* 20* 23  GLUCOSE 98 89 75  BUN 47* 59* 34*  CREATININE 9.17* 10.81* 6.86*  CALCIUM  9.0 8.8* 8.3*  PHOS 6.3* 7.5* 7.2*   Liver Function Tests: Recent Labs  Lab 03/11/24 0936 03/12/24 4696 03/14/24 0535 03/15/24 0957 03/16/24 0804  AST 19  --   --   --   --   ALT 11  --   --   --   --   ALKPHOS 49  --   --   --   --   BILITOT 0.5  --   --   --   --   PROT 8.1  --   --   --   --   ALBUMIN  3.5   < > 3.3* 3.4* 3.0*   < > = values in this interval not displayed.   No results for input(s): "LIPASE", "AMYLASE" in the last 168 hours. CBC: Recent Labs  Lab 03/10/24 0233 03/11/24 0936 03/12/24 0936 03/15/24 0957 03/16/24 0805  WBC 8.1 7.3 9.2 6.1 5.7  NEUTROABS  --  5.4 7.2  --  3.3  HGB 9.3* 10.2* 9.8* 9.8* 9.1*  HCT 28.8* 32.4* 31.0* 30.8* 28.8*  MCV 109.1* 110.6* 109.2* 109.2* 108.3*  PLT 146* 135* 144* 141* 153   Blood Culture    Component Value Date/Time   SDES BLOOD BLOOD RIGHT  HAND AEROBIC BOTTLE ONLY 02/09/2024 0414   SPECREQUEST  02/09/2024 0414    BOTTLES DRAWN AEROBIC ONLY Blood Culture adequate volume   CULT  02/09/2024 0414    NO GROWTH 5 DAYS Performed at [facility], [address], [place], [place] [address]    REPTSTATUS 02/14/2024 FINAL 02/09/2024 0414    Cardiac Enzymes: No results for input(s): "CKTOTAL", "CKMB", "CKMBINDEX", "TROPONINI" in the last 168 hours. CBG: Recent Labs  Lab 03/14/24 1646 03/14/24 2058 03/15/24 1120 03/15/24 2022 03/16/24 0744  GLUCAP 101* 121* 95 98 84   Iron  Studies: No results for input(s): "IRON ", "TIBC", "TRANSFERRIN", "FERRITIN" in the last 72 hours. @lablastinr3 @ Studies/Results: No results found. Medications:  ceFEPime  (MAXIPIME ) IV Stopped (03/14/24 1316)   promethazine  (PHENERGAN ) injection (IM or IVPB) Stopped (03/14/24 4132)   vancomycin  Stopped (03/14/24 1316)    apixaban   5 mg Oral BID   vitamin C   1,000 mg Oral Daily   atorvastatin   80 mg Oral Daily  bethanechol   10 mg Oral BID   Chlorhexidine  Gluconate Cloth  6 each Topical Q0600   Chlorhexidine  Gluconate Cloth  6 each Topical Q0600   Chlorhexidine  Gluconate Cloth  6 each Topical Q0600   darbepoetin (ARANESP ) injection - DIALYSIS  100 mcg Subcutaneous Q Sat-1800   lactulose   30 g Oral Daily   levothyroxine   50 mcg Oral QAC breakfast   midodrine   5 mg Oral TID WC   multivitamin  1 tablet Oral QHS   naloxegol  oxalate  12.5 mg Oral Daily   nutrition supplement (JUVEN)  1 packet Oral BID BM   oxyCODONE   30 mg Oral Q8H   polyethylene glycol  17 g Oral BID   senna-docusate  4 tablet Oral BID   sevelamer  carbonate  1,600 mg Oral TID WC   tamsulosin   0.4 mg Oral Daily    Dialysis Orders: T[facility] previously (now he will be going to [place]) 4h  B400  92kg  TDC  Heparin  none - Mircera 225mcg IV q 2 weeks (last 4/9) - Calcitriol  0.26mcg PO q HD  Assessment/Plan: 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 CT imaging  showed T9-10 discitis. He refused MRI, area was too small for aspiration per IR. H/o of foot infection as below. Blood Cxs were negative. Not an operative candidate per neurosurgery.   Per ID continue IV Vanc 750mg  and IV Cefepime  2gm with HD x 6 weeks thru 03/23/24 for the discitis.  Left foot osteomyelitis - completed an 8 week course of Dapto/Cefepime  on 3/28 for bacteremia presumed due to foot infection. S/p left TMA on 4/30. Antibiotics as above.  Hyperkalemia - continue renal diet.improved  ESRD:   Transitioned to MWF schedule for HD to align with his outpatient schedule (note he will now be MWF [place] given the SNF plans) HD today Hypotension: started on midodrine  this admission due to hypotension.  He has a history of HTN and is off of multiple anti-hypertensives.  We have been doing little to no UF with HD- having some orthopnea so will try 0.5kg if BP tolerates.  He was hypertensive on midodrine  10 mg TID so we reduced midodrine  to 5 mg TID.  Anemia of CKD: Continue aranesp  100 mcg every Saturday. No venofer in setting of infection Metabolic bone disease: hyperphos - continue home binders and encourage HD adherence Nutrition: prot supp for low albumin    Ramona Burner, PA-C 03/16/2024, 10:35 AM   [facility] Pager: [phone]

## 2024-03-16 NOTE — Telephone Encounter (Signed)
 Secure chat per Dr. [name] can we reschedule this patient to around 6/18, whoever has a space.  Patient currently in the hospital being discharged to SNF Skilled Nursing Contact information: [address] [place]  [phone]  Will route to front desk staff to coordinate with SNF for follow up around 04/06/24

## 2024-03-16 NOTE — TOC Transition Note (Signed)
 Transition of Care [facility]) - Discharge Note   Patient Details  Name: Thomas [name] MRN: [id number] Date of Birth: 01-03-1951  Transition of Care [facility]) CM/SW Contact:  Arron Big, LCSWA Phone Number: 03/16/2024, 3:53 PM   Clinical Narrative:   Patient will DC to: [facility] Anticipated DC date: 03/16/24  Family notified: Unable to reach family Transport by: Lyna Sandhoff   Per MD patient ready for DC to [facility]. RN to call report prior to discharge [phone]). RN, patient, patient's family, and facility notified of DC. Discharge Summary and FL2 sent to facility. DC packet on chart. Ambulance transport requested for patient 3:54 PM.   CSW will sign off for now as social work intervention is no longer needed. Please consult us  again if new needs arise.      Final next level of care: Skilled Nursing Facility Barriers to Discharge: Barriers Resolved   Patient Goals and CMS Choice Patient states their goals for this hospitalization and ongoing recovery are:: To return home CMS Medicare.gov Compare Post Acute Care list provided to:: Patient Choice offered to / list presented to : Patient [facility] Agency ownership interest in [facility].provided to:: Patient    Discharge Placement              Patient chooses bed at: [facility] Patient to be transferred to facility by: ptar Name of family member notified: Unable to reach family Patient and family notified of of transfer: 03/16/24  Discharge Plan and Services Additional resources added to the After Visit Summary for       Post Acute Care Choice: Skilled Nursing Facility                    HH Arranged: PT, RN, OT, Nurse's Aide, Disease Management, Social Work [facility] Agency: [facility] Date [facility] Agency Contacted: 03/14/24 Time HH Agency Contacted: 1220 Representative spoke with at [facility] Agency: Loetta Ringer  Social Drivers of Health (SDOH) Interventions SDOH Screenings   Food Insecurity: No Food  Insecurity (02/09/2024)  Housing: Low Risk  (02/09/2024)  Transportation Needs: No Transportation Needs (02/09/2024)  Utilities: Not At Risk (02/09/2024)  Depression (PHQ2-9): Low Risk  (02/08/2020)  Social Connections: Socially Isolated (02/11/2024)  Tobacco Use: Low Risk  (02/07/2024)     Readmission Risk Interventions    03/15/2024   11:16 AM 01/15/2022    2:56 PM 01/02/2022    2:03 PM  Readmission Risk Prevention Plan  Transportation Screening Complete Complete Complete  Medication Review (RN Care Manager) Referral to Pharmacy Complete Complete  PCP or Specialist appointment within 3-5 days of discharge Complete Complete Complete  HRI or Home Care Consult Complete Complete Complete  SW Recovery Care/Counseling Consult Complete Complete Complete  Palliative Care Screening Not Applicable Not Applicable Not Applicable  Skilled Nursing Facility Not Applicable Not Applicable Not Applicable

## 2024-03-16 NOTE — TOC Progression Note (Addendum)
 Transition of Care [facility]) - Progression Note    Patient Details  Name: Thomas [name] MRN: [id number] Date of Birth: 1951-04-09  Transition of Care [facility]) CM/SW Contact  Arron Big, [place] Phone Number: 03/16/2024, 8:54 AM  Clinical Narrative:   Insurance approved 03/15/2024-03/17/2024; [employer] id [id number]. CSW updated facility. Waiting on DC summary to submit to facility.   10:30 AM Patient will discharge after dialysis session to [facility]. CSW reminded [facility] that patient will need to send hoyer pad/sling with pt to HD appts.   TOC will continue to follow.    Expected Discharge Plan: Skilled Nursing Facility Barriers to Discharge: Barriers Resolved  Expected Discharge Plan and Services     Post Acute Care Choice: Skilled Nursing Facility Living arrangements for the past 2 months: Single Family Home Expected Discharge Date: 03/15/24                         HH Arranged: PT, RN, OT, Nurse's Aide, Disease Management, Social Work [employer] Agency: [facility] Date HH Agency Contacted: 03/14/24 Time HH Agency Contacted: 1220 Representative spoke with at [facility] Agency: Loetta Ringer   Social Determinants of Health (SDOH) Interventions SDOH Screenings   Food Insecurity: No Food Insecurity (02/09/2024)  Housing: Low Risk  (02/09/2024)  Transportation Needs: No Transportation Needs (02/09/2024)  Utilities: Not At Risk (02/09/2024)  Depression (PHQ2-9): Low Risk  (02/08/2020)  Social Connections: Socially Isolated (02/11/2024)  Tobacco Use: Low Risk  (02/07/2024)    Readmission Risk Interventions    03/15/2024   11:16 AM 01/15/2022    2:56 PM 01/02/2022    2:03 PM  Readmission Risk Prevention Plan  Transportation Screening Complete Complete Complete  Medication Review (RN Care Manager) Referral to Pharmacy Complete Complete  PCP or Specialist appointment within 3-5 days of discharge Complete Complete Complete  HRI or Home Care Consult Complete Complete Complete  SW Recovery  Care/Counseling Consult Complete Complete Complete  Palliative Care Screening Not Applicable Not Applicable Not Applicable  Skilled Nursing Facility Not Applicable Not Applicable Not Applicable

## 2024-03-17 ENCOUNTER — Inpatient Hospital Stay: Admitting: Internal Medicine

## 2024-03-21 ENCOUNTER — Encounter (HOSPITAL_COMMUNITY): Payer: Self-pay | Admitting: *Deleted

## 2024-03-21 ENCOUNTER — Emergency Department (HOSPITAL_COMMUNITY)
Admission: EM | Admit: 2024-03-21 | Discharge: 2024-03-22 | Disposition: A | Discharge status: Home / Self Care | Attending: Emergency Medicine | Admitting: Emergency Medicine

## 2024-03-21 ENCOUNTER — Other Ambulatory Visit: Payer: Self-pay

## 2024-03-21 ENCOUNTER — Emergency Department (HOSPITAL_COMMUNITY)

## 2024-03-21 DIAGNOSIS — E875 Hyperkalemia: Secondary | ICD-10-CM | POA: Insufficient documentation

## 2024-03-21 DIAGNOSIS — I5022 Chronic systolic (congestive) heart failure: Secondary | ICD-10-CM | POA: Insufficient documentation

## 2024-03-21 DIAGNOSIS — M545 Low back pain, unspecified: Secondary | ICD-10-CM | POA: Insufficient documentation

## 2024-03-21 DIAGNOSIS — Z7901 Long term (current) use of anticoagulants: Secondary | ICD-10-CM | POA: Insufficient documentation

## 2024-03-21 DIAGNOSIS — Z79899 Other long term (current) drug therapy: Secondary | ICD-10-CM | POA: Insufficient documentation

## 2024-03-21 DIAGNOSIS — N186 End stage renal disease: Secondary | ICD-10-CM | POA: Insufficient documentation

## 2024-03-21 DIAGNOSIS — Z992 Dependence on renal dialysis: Secondary | ICD-10-CM | POA: Diagnosis not present

## 2024-03-21 LAB — I-STAT CHEM 8, ED
BUN: 72 mg/dL — ABNORMAL HIGH (ref 8–23)
Calcium, Ion: 0.94 mmol/L — ABNORMAL LOW (ref 1.15–1.40)
Chloride: 109 mmol/L (ref 98–111)
Creatinine, Ser: 15.2 mg/dL — ABNORMAL HIGH (ref 0.61–1.24)
Glucose, Bld: 103 mg/dL — ABNORMAL HIGH (ref 70–99)
HCT: 33 % — ABNORMAL LOW (ref 39.0–52.0)
Hemoglobin: 11.2 g/dL — ABNORMAL LOW (ref 13.0–17.0)
Potassium: 7.1 mmol/L (ref 3.5–5.1)
Sodium: 142 mmol/L (ref 135–145)
TCO2: 20 mmol/L — ABNORMAL LOW (ref 22–32)

## 2024-03-21 LAB — CBC WITH DIFFERENTIAL/PLATELET
Abs Immature Granulocytes: 0 10*3/uL (ref 0.00–0.07)
Basophils Absolute: 0 10*3/uL (ref 0.0–0.1)
Basophils Relative: 0 %
Eosinophils Absolute: 0.4 10*3/uL (ref 0.0–0.5)
Eosinophils Relative: 6 %
HCT: 35.2 % — ABNORMAL LOW (ref 39.0–52.0)
Hemoglobin: 11 g/dL — ABNORMAL LOW (ref 13.0–17.0)
Lymphocytes Relative: 15 %
Lymphs Abs: 1.1 10*3/uL (ref 0.7–4.0)
MCH: 34.5 pg — ABNORMAL HIGH (ref 26.0–34.0)
MCHC: 31.3 g/dL (ref 30.0–36.0)
MCV: 110.3 fL — ABNORMAL HIGH (ref 80.0–100.0)
Monocytes Absolute: 0.4 10*3/uL (ref 0.1–1.0)
Monocytes Relative: 5 %
Neutro Abs: 5.2 10*3/uL (ref 1.7–7.7)
Neutrophils Relative %: 74 %
Platelets: 214 10*3/uL (ref 150–400)
RBC: 3.19 MIL/uL — ABNORMAL LOW (ref 4.22–5.81)
RDW: 14.9 % (ref 11.5–15.5)
WBC: 7 10*3/uL (ref 4.0–10.5)
nRBC: 0 % (ref 0.0–0.2)
nRBC: 0 /100{WBCs}

## 2024-03-21 LAB — COMPREHENSIVE METABOLIC PANEL WITH GFR
ALT: 11 U/L (ref 0–44)
AST: 18 U/L (ref 15–41)
Albumin: 3.7 g/dL (ref 3.5–5.0)
Alkaline Phosphatase: 56 U/L (ref 38–126)
Anion gap: 22 — ABNORMAL HIGH (ref 5–15)
BUN: 74 mg/dL — ABNORMAL HIGH (ref 8–23)
CO2: 19 mmol/L — ABNORMAL LOW (ref 22–32)
Calcium: 8.6 mg/dL — ABNORMAL LOW (ref 8.9–10.3)
Chloride: 102 mmol/L (ref 98–111)
Creatinine, Ser: 14.14 mg/dL — ABNORMAL HIGH (ref 0.61–1.24)
GFR, Estimated: 3 mL/min — ABNORMAL LOW (ref >60)
Glucose, Bld: 94 mg/dL (ref 70–99)
Potassium: 6.5 mmol/L (ref 3.5–5.1)
Sodium: 143 mmol/L (ref 135–145)
Total Bilirubin: 0.9 mg/dL (ref 0.0–1.2)
Total Protein: 9.3 g/dL — ABNORMAL HIGH (ref 6.5–8.1)

## 2024-03-21 LAB — TROPONIN I (HIGH SENSITIVITY): Troponin I (High Sensitivity): 13 ng/L (ref <18)

## 2024-03-21 MED ORDER — SODIUM ZIRCONIUM CYCLOSILICATE 10 G PO PACK
10.0000 g | PACK | Freq: Once | ORAL | Status: AC
  Administered 2024-03-21: 10 g via ORAL
  Filled 2024-03-21: qty 1

## 2024-03-21 MED ORDER — CALCIUM GLUCONATE-NACL 1-0.675 GM/50ML-% IV SOLN
1.0000 g | Freq: Once | INTRAVENOUS | Status: AC
Start: 2024-03-21 — End: 2024-03-21
  Administered 2024-03-21: 1000 mg via INTRAVENOUS
  Filled 2024-03-21: qty 50

## 2024-03-21 MED ORDER — DEXTROSE 50 % IV SOLN
1.0000 | Freq: Once | INTRAVENOUS | Status: AC
  Administered 2024-03-21: 50 mL via INTRAVENOUS
  Filled 2024-03-21: qty 50

## 2024-03-21 MED ORDER — FENTANYL CITRATE PF 50 MCG/ML IJ SOSY
PREFILLED_SYRINGE | INTRAMUSCULAR | Status: AC
Start: 2024-03-21 — End: ?
  Filled 2024-03-21: qty 1

## 2024-03-21 MED ORDER — INSULIN ASPART 100 UNIT/ML IV SOLN
5.0000 [IU] | Freq: Once | INTRAVENOUS | Status: AC
  Administered 2024-03-21: 5 [IU] via INTRAVENOUS

## 2024-03-21 MED ORDER — FENTANYL CITRATE PF 50 MCG/ML IJ SOSY
50.0000 ug | PREFILLED_SYRINGE | Freq: Once | INTRAMUSCULAR | Status: AC
  Administered 2024-03-21 (×2): 50 ug via INTRAVENOUS
  Filled 2024-03-21: qty 1

## 2024-03-21 MED ORDER — CHLORHEXIDINE GLUCONATE CLOTH 2 % EX PADS: 6.0000 | MEDICATED_PAD | Freq: Every day | CUTANEOUS | Status: DC

## 2024-03-21 NOTE — ED Provider Notes (Signed)
 [place] EMERGENCY DEPARTMENT AT [facility] Provider Note   CSN: [id number] Arrival date & time: 03/21/24  1422     History  Chief Complaint  Patient presents with   Needs Dialysis    Thomas [name] is a 73 y.o. male.  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 Julio Ohm on April 30, was in the ICU May 22 for hypotension which improved, plan for 8 weeks of vancomycin /cefepime  with dialysis who presents from [place] after he has missed 2 dialysis sessions.    Per facility-- he refused dialysis twice in the last week. Different excuses each time per facility. He reports he is not sure it was offered to him. He is unhappy with the food, has not liked the options and has had only 2 meals this week. No fevers, no other acute concerns. No cp or dyspnea.  He reports he thinks he is here for dialysis and to "go back to that hell hole" .  Would like transfer to another facility if possible.    Home Medications Prior to Admission medications   Medication Sig Start Date End Date Taking? Authorizing Provider  apixaban  (ELIQUIS ) 5 MG TABS tablet Take 1 tablet (5 mg total) by mouth 2 (two) times daily. 03/15/24   Samtani, Jai-Gurmukh, MD  atorvastatin  (LIPITOR ) 80 MG tablet Take 80 mg by mouth daily.    [provider]  bethanechol  (URECHOLINE ) 10 MG tablet Take 10 mg by mouth 2 (two) times daily. 12/21/23   [provider]  bisacodyl  (DULCOLAX) 10 MG  suppository Place 1 suppository (10 mg total) rectally daily as needed for moderate constipation. 03/15/24   Samtani, Jai-Gurmukh, MD  ceFEPime  (MAXIPIME ) IVPB Inject 2 g into the vein Every Tuesday,Thursday,and Saturday with dialysis. Indication:  Discitis/osteo Last Day of Therapy:  04/06/24 Labs - Once weekly:  CBC/D and BMP, ESR and CRP Method of administration: Per hemodialysis protocol, to be given at HD center= 03/17/24 04/27/24  Liane Redman, MD  Cholecalciferol  125 MCG (5000 UT) capsule Take 5,000 Units by mouth daily.    [provider]  cyanocobalamin  (VITAMIN B12) 1000 MCG tablet Take 1 tablet by mouth daily.    [provider]  lactulose  (CHRONULAC ) 10 GM/15ML solution Take 45 mLs (30 g total) by mouth daily. 03/15/24   Samtani, Jai-Gurmukh, MD  levothyroxine  (SYNTHROID ) 50 MCG tablet Take 50 mcg by mouth daily before breakfast.     [provider]  midodrine  (PROAMATINE ) 5 MG tablet Take 1 tablet (5 mg total) by mouth 3 (three) times daily with meals. 03/15/24   Samtani, Jai-Gurmukh, MD  OXYCONTIN  30 MG 12 hr tablet Take 1 tablet (30 mg total) by mouth 3 (three) times daily as needed (pain). 03/16/24   Kraig Peru, MD  senna-docusate (SENOKOT-S) 8.6-50 MG tablet Take 4 tablets by mouth 2 (two) times daily. 11/04/22   [provider]  sevelamer  (RENAGEL ) 800 MG tablet Take 1,600 mg by mouth 3 (three) times daily. 08/27/23   [provider]  tamsulosin  (FLOMAX ) 0.4 MG CAPS capsule Take 1 capsule (0.4 mg total) by mouth daily. 09/25/20   Avi Body, MD  tiZANidine  (ZANAFLEX ) 4 MG tablet Take 4 mg by mouth every 8 (eight) hours as needed for muscle spasms. 11/26/21   [provider]  vancomycin  IVPB Inject 750 mg into the vein Every Tuesday,Thursday,and Saturday with dialysis. Indication:  Discitis/osteo Last Day of Therapy:  04/06/24 Labs - Once weekly:  CBC/D, BMP, and pre-HD Vanc level (goal 15-25 mcg/ml), ESR and CRP Method of  administration: Per hemodialysis protocol - to be given at HD center 03/17/24 04/15/24  Liane Redman, MD  Vitamin E 670 MG (1000 UT) CAPS Take 1,000 mg by mouth daily.    [provider]      Allergies    Other, Oxymorphone, Amitriptyline, Aspirin, Beta vulgaris, Buspirone, Cabbage, Codeine, Cyclobenzaprine, Diflunisal, Fish allergy, Fish-derived products, Hydroxyzine, Levetiracetam, Methadone, Nalbuphine, Pentazocine, Propoxyphene, Shellfish allergy, Sulfa antibiotics, Sulfasalazine, and Amoxicillin     Review of Systems   Review of Systems  Physical Exam Updated Vital Signs BP 129/81   Pulse 79   Temp 98.6 F (37 C) (Oral)   Resp (!) 22   SpO2 98%  Physical Exam Vitals and nursing note reviewed.  Constitutional:      General: He is not in acute distress.    Appearance: He is well-developed. He is not diaphoretic.  HENT:     Head: Normocephalic and atraumatic.  Eyes:     Conjunctiva/sclera: Conjunctivae normal.  Cardiovascular:     Rate and Rhythm: Normal rate and regular rhythm.     Heart sounds: Normal heart sounds. No murmur heard.    No friction rub. No gallop.  Pulmonary:     Effort: Pulmonary effort is normal. No respiratory distress.     Breath sounds: Normal breath sounds. No wheezing or rales.  Abdominal:     General: There is no distension.     Palpations: Abdomen is soft.     Tenderness: There is no abdominal tenderness. There is no guarding.  Musculoskeletal:        General: Tenderness (back, has been present, no erythema) present.     Cervical back: Normal range of motion.     Comments: L transtarsal amputation/c/d/I, no erythema  Skin:    General: Skin is warm and dry.  Neurological:     Mental Status: He is alert and oriented to person, place, and time.     ED Results / Procedures / Treatments   Labs (all labs ordered are listed, but only abnormal results are displayed) Labs Reviewed  CBC WITH DIFFERENTIAL/PLATELET - Abnormal; Notable for  the following components:      Result Value   RBC 3.19 (*)    Hemoglobin 11.0 (*)    HCT 35.2 (*)    MCV 110.3 (*)    MCH 34.5 (*)    All other components within normal limits  COMPREHENSIVE METABOLIC PANEL WITH GFR - Abnormal; Notable for the following components:   Potassium 6.5 (*)    CO2 19 (*)    BUN 74 (*)    Creatinine, Ser 14.14 (*)    Calcium  8.6 (*)    Total Protein 9.3 (*)    GFR, Estimated 3 (*)    Anion gap 22 (*)    All other components within normal limits  I-STAT CHEM 8, ED - Abnormal; Notable for the following components:   Potassium 7.1 (*)    BUN 72 (*)  Creatinine, Ser 15.20 (*)    Glucose, Bld 103 (*)    Calcium , Ion 0.94 (*)    TCO2 20 (*)    Hemoglobin 11.2 (*)    HCT 33.0 (*)    All other components within normal limits  I-STAT CHEM 8, ED - Abnormal; Notable for the following components:   BUN 27 (*)    Creatinine, Ser 6.60 (*)    Glucose, Bld 105 (*)    Calcium , Ion 1.00 (*)    Hemoglobin 10.9 (*)    HCT 32.0 (*)    All other components within normal limits  HEPATITIS B SURFACE ANTIGEN  HEPATITIS B SURFACE ANTIBODY, QUANTITATIVE  TROPONIN I (HIGH SENSITIVITY)    EKG EKG Interpretation Date/Time:  Monday March 21 2024 14:37:27 EDT Ventricular Rate:  92 PR Interval:  190 QRS Duration:  92 QT Interval:  388 QTC Calculation: 479 R Axis:   10  Text Interpretation: Normal sinus rhythm Possible Anterior infarct , age undetermined Abnormal ECG When compared with ECG of 06-Mar-2024 01:14, No significant change since last tracing Confirmed by Scarlette Currier ([id number]) on 03/21/2024 4:47:26 PM  Radiology DG Chest 2 View Result Date: 03/21/2024 CLINICAL DATA:  No hemodialysis for 5 days. EXAM: CHEST - 2 VIEW COMPARISON:  Chest x-ray 03/06/2024. FINDINGS: Right-sided central venous catheter tip ends in the distal SVC. The heart is mildly enlarged. The aorta is ectatic. Sternotomy wires are present. There are some interstitial opacities in the left mid  and lower lung and right upper lung similar to prior. There is no new focal lung infiltrate, pleural effusion or pneumothorax. Lower thoracic spinal fusion hardware is present. IMPRESSION: 1. No acute cardiopulmonary process. 2. Mild cardiomegaly. 3. Stable appearing chronic interstitial opacities. Electronically Signed   By: Tyron Gallon M.D.   On: 03/21/2024 18:54    Procedures .Critical Care  Performed by: Scarlette Currier, MD Authorized by: Scarlette Currier, MD   Critical care provider statement:    Critical care time (minutes):  30   Critical care was time spent personally by me on the following activities:  Development of treatment plan with patient or surrogate, discussions with consultants, ordering and review of laboratory studies, ordering and review of radiographic studies, ordering and performing treatments and interventions, pulse oximetry, re-evaluation of patient's condition and review of old charts     Medications Ordered in ED Medications  Chlorhexidine  Gluconate Cloth 2 % PADS 6 each (has no administration in time range)  sodium zirconium cyclosilicate  (LOKELMA ) packet 10 g (10 g Oral Given 03/21/24 1900)  calcium  gluconate 1 g/ 50 mL sodium chloride  IVPB (0 mg Intravenous Stopped 03/21/24 1920)  insulin  aspart (novoLOG ) injection 5 Units (5 Units Intravenous Given 03/21/24 1928)    And  dextrose  50 % solution 50 mL (50 mLs Intravenous Given 03/21/24 1928)  fentaNYL  (SUBLIMAZE ) injection 50 mcg (50 mcg Intravenous Given 03/21/24 2222)  oxyCODONE  (Oxy IR/ROXICODONE ) immediate release tablet 5 mg (5 mg Oral Given 03/22/24 0502)    ED Course/ Medical Decision Making/ A&P Clinical Course as of 03/22/24 0956  Tue Mar 22, 2024  0454 Repeat Chem-8 is not transferring into the chart.  Potassium is 4.1.  Will discharge back to facility. 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 Julio Ohm on April 30, was in the ICU May 22 for hypotension which improved, plan for 8 weeks of vancomycin /cefepime  with dialysis who presents from [facility] after he has missed 2 dialysis sessions.  He and facility deny symptoms other than concern he has missed dialysis sessions.  XR without acute abnormalities. No hypoxia.  Labs completed and evaluated by me show K 6.5 (7.1 on istat), no other acute concerns, normal troponin, mild anemia, no leukocytosis, labs consistent with missing dialysis with elevated BUN/Cr/anion gap.    Given insulin , dextrose , calcium , lokelma  for hyperkalemia.  ECG evaluated by me without significant changes.  Taken to dialysis emergently with Nephrology for hyperkalemia.   If stable following dialysis without other concerns. he may be discharged.  Can discuss interest in transfer to other facilities with LCSW at [facility]. Continue outpatient dialysis as scheduled.          Final Clinical Impression(s) / ED Diagnoses Final diagnoses:  ESRD needing dialysis (HCC)  Hyperkalemia    Rx / DC Orders ED Discharge Orders     None         Scarlette Currier, MD 03/22/24 [phone]

## 2024-03-21 NOTE — ED Triage Notes (Signed)
 Pt arrives by [facility] from [place] where he states that he has been since Friday after he was d/c from hospital due to osteomyelitis.  Per ems pt is being sent here due to "refusing HD" but pt denies any refusal but states that they did not offer HD there.  No CP or sob.  Pt reportedly had HD last on Wednesday.  Pt seems irritated in triage about [facility] and tells me that he will not go back.

## 2024-03-21 NOTE — ED Notes (Addendum)
 Pt to HD

## 2024-03-21 NOTE — Progress Notes (Signed)
 Notified by ED MD that this patient has not gone to outpatient dialysis since he was discharged on 03/16/24 (per MD, pt refused to go to new unit).  He is also not happy with the SNF.  His K is 6.5 and we were asked to provide dialysis today.  Unclear what his disposition will be.  If he is admitted, we will see him in the morning.

## 2024-03-22 LAB — I-STAT CHEM 8, ED
BUN: 27 mg/dL — ABNORMAL HIGH (ref 8–23)
Calcium, Ion: 1 mmol/L — ABNORMAL LOW (ref 1.15–1.40)
Chloride: 98 mmol/L (ref 98–111)
Creatinine, Ser: 6.6 mg/dL — ABNORMAL HIGH (ref 0.61–1.24)
Glucose, Bld: 105 mg/dL — ABNORMAL HIGH (ref 70–99)
HCT: 32 % — ABNORMAL LOW (ref 39.0–52.0)
Hemoglobin: 10.9 g/dL — ABNORMAL LOW (ref 13.0–17.0)
Potassium: 4.1 mmol/L (ref 3.5–5.1)
Sodium: 136 mmol/L (ref 135–145)
TCO2: 27 mmol/L (ref 22–32)

## 2024-03-22 LAB — HEPATITIS B SURFACE ANTIGEN: Hepatitis B Surface Ag: NONREACTIVE

## 2024-03-22 MED ORDER — OXYCODONE HCL 5 MG PO TABS
5.0000 mg | ORAL_TABLET | Freq: Once | ORAL | Status: AC
  Administered 2024-03-22: 5 mg via ORAL
  Filled 2024-03-22: qty 1

## 2024-03-22 MED ORDER — HEPARIN SODIUM (PORCINE) 1000 UNIT/ML IJ SOLN
INTRAMUSCULAR | Status: AC
  Filled 2024-03-22: qty 4

## 2024-03-22 NOTE — Discharge Instructions (Signed)
 It is very important that you continue to get your dialysis as scheduled as an outpatient.  If you refuse dialysis and your potassium increases, it can cause heart arrhythmias or even death.

## 2024-03-22 NOTE — Progress Notes (Signed)
   03/22/24 0210  Vitals  BP 116/85  BP Location Right Arm  BP Method Automatic  Patient Position (if appropriate) Lying  Pulse Rate 95  Resp 17  Oxygen Therapy  SpO2 100 %  During Treatment Monitoring  Blood Flow Rate (mL/min) 319 mL/min  Arterial Pressure (mmHg) -143.63 mmHg  Venous Pressure (mmHg) 153.12 mmHg  TMP (mmHg) 4.04 mmHg  Ultrafiltration Rate (mL/min) 0 mL/min  Dialysate Flow Rate (mL/min) 299 ml/min  Dialysate Potassium Concentration 2  Dialysate Calcium  Concentration 2.5  Duration of HD Treatment -hour(s) 4 hour(s)  Cumulative Fluid Removed (mL) per Treatment  -336.84  Intra-Hemodialysis Comments Tx completed  Post Treatment  Dialyzer Clearance Lightly streaked  Liters Processed 89  Fluid Removed (mL) 300 mL  Tolerated HD Treatment No (Comment)  Hemodialysis Catheter Right Internal jugular  Placement Date/Time: 01/02/24 1459   Placed prior to admission: Yes  Orientation: Right  Access Location: Internal jugular  Site Condition No complications  Blue Lumen Status Flushed;Heparin  locked  Red Lumen Status Flushed;Heparin  locked  Catheter fill solution Heparin  1000 units/ml  Catheter fill volume (Arterial) 1.6 cc  Catheter fill volume (Venous) 1.6  Dressing Type Transparent  Dressing Status Antimicrobial disc/dressing in place  Interventions New dressing  Drainage Description None  Dressing Change Due 03/28/24  Post treatment catheter status Capped and Clamped   Unable to UF as ordered: Pt C/O pain and cramps. Noted elevated Heart rate and low blood pressure.

## 2024-03-22 NOTE — ED Provider Notes (Signed)
 Patient returned from dialysis.  Previous ED note reviewed.  Potassium 7.1 prior to dialysis.  Repeat Chem-8 with potassium of 4.1.  Patient is apparently unhappy with his SNF; however, there is no reason at this time that he cannot return there for the time being.  I encouraged him to make sure that he goes to dialysis as scheduled otherwise he may develop life-threatening emergencies including hyperkalemia.  Physical Exam  BP 112/78 (BP Location: Right Arm)   Pulse 93   Temp 98.6 F (37 C) (Oral)   Resp 20   SpO2 97%   Physical Exam Awake, alert, no acute distress  Procedures  Procedures  ED Course / MDM   Clinical Course as of 03/22/24 0455  Tue Mar 22, 2024  0454 Repeat Chem-8 is not transferring into the chart.  Potassium is 4.1.  Will discharge back to facility. [CH]    Clinical Course User Index [CH] Semisi Biela, Vonzella Guernsey, MD   Medical Decision Making Amount and/or Complexity of Data Reviewed Labs: ordered. Radiology: ordered.  Risk OTC drugs. Prescription drug management.   Problem List Items Addressed This Visit       Other   Hyperkalemia   Other Visit Diagnoses       ESRD needing dialysis [facility])    -  Primary             Carylon Claude Vonzella Guernsey, MD 03/22/24 [phone]

## 2024-03-23 LAB — HEPATITIS B SURFACE ANTIBODY, QUANTITATIVE: Hep B S AB Quant (Post): 7.6 m[IU]/mL — ABNORMAL LOW

## 2024-03-28 NOTE — Progress Notes (Signed)
 Office Visit Note   Patient: Thomas [name]           Date of Birth: 02/03/1951           MRN: [id number] Visit Date: 03/29/2024              Requested by: Wright Heal, MD [id number] [place],  [place] [address] PCP: Wright Heal, MD  Chief Complaint  Patient presents with   Left Foot - Routine Post Op    02/17/2024 left TMA      HPI: Thomas [name] is a 73 y.o. male who presents with ulcerations swelling and drainage from the toes of the left foot.  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  He was diagnosed with Osteomyelitis Left Foot.  Patient had persistent gout symptoms and ulcers with drainage in the left foot. He is s/p AMPUTATION, left transmetatarsal amputation.  Application of Kerecis micro graft 38 cm.  He is here today for follow up exam and suture removal.  Assessment & Plan: Visit Diagnoses:  1. S/P transmetatarsal amputation of foot, left (HCC)     Plan: WBAT s/p suture removal TMA  Follow-Up Instructions: Return in about 4 weeks (around 04/26/2024).   Ortho Exam  Patient is alert, oriented, no adenopathy, well-dressed, normal affect, normal respiratory effort. Well healed TMA left LE.  Palpable DP pulse left LE.  No erythema or edema.  Sutures removed, patient tolerated this well.    Imaging: No results found. No images are attached to the encounter.  Labs: Lab Results  Component Value Date   HGBA1C 4.2 (L) 02/09/2024   HGBA1C 4.8 12/19/2021   HGBA1C 6.0 (H) 09/20/2021   ESRSEDRATE 120 (H) 03/16/2024   ESRSEDRATE 130 (H) 02/12/2024   ESRSEDRATE 112 (H) 02/09/2024   CRP 1.0 (H) 03/16/2024   CRP 1.4 (H) 02/09/2024   CRP 1.4 (H) 01/14/2022   LABURIC 5.1 02/15/2024   REPTSTATUS 02/14/2024 FINAL 02/09/2024   GRAMSTAIN NO WBC SEEN NO ORGANISMS SEEN  09/04/2020   CULT  02/09/2024    NO GROWTH 5 DAYS Performed at [facility], [address], [place],  [place] [address]      Lab Results  Component Value Date   ALBUMIN  3.7 03/21/2024   ALBUMIN  3.0 (L) 03/16/2024   ALBUMIN  3.4 (L) 03/15/2024   PREALBUMIN 23.8 11/24/2019    Lab Results  Component Value Date   MG 2.4 02/09/2024   MG 1.8 01/14/2022   MG 1.8 01/13/2022   Lab Results  Component Value Date   VD25OH 74.12 09/19/2020    Lab Results  Component Value Date   PREALBUMIN 23.8 11/24/2019      Latest Ref Rng & Units 03/22/2024    3:31 AM 03/21/2024    6:01 PM 03/21/2024    3:45 PM  CBC EXTENDED  WBC 4.0 - 10.5 K/uL   7.0   RBC 4.22 - 5.81 MIL/uL   3.19   Hemoglobin 13.0 - 17.0 g/dL 82.9  56.2  13.0   HCT 39.0 - 52.0 % 32.0  33.0  35.2   Platelets 150 - 400 K/uL   214   NEUT# 1.7 - 7.7 K/uL   5.2   Lymph# 0.7 - 4.0 K/uL   1.1      There is no height or weight on file to calculate BMI.  Orders:  No orders of the defined types were placed  in this encounter.  No orders of the defined types were placed in this encounter.    Procedures: No procedures performed  Clinical Data: No additional findings.  ROS:  All other systems negative, except as noted in the HPI. Review of Systems  Objective: Vital Signs: There were no vitals taken for this visit.  Specialty Comments:  No specialty comments available.  PMFS History: Patient Active Problem List   Diagnosis Date Noted   Malnutrition of moderate degree 03/12/2024   Subacute osteomyelitis of left foot (HCC) 02/15/2024   Acute low back pain 02/09/2024   Fall 02/09/2024   End stage renal disease on dialysis [facility]) 02/09/2024   History of anemia due to chronic kidney disease 02/09/2024   Acute midline thoracic back pain 02/09/2024   Osteomyelitis of thoracic spine (HCC) 02/08/2024   Gabapentin -induced toxicity 01/14/2022   Open wound of left heel 01/14/2022   Acute renal failure superimposed on stage 4 chronic kidney disease (HCC) 01/13/2022   Chest pain 01/13/2022   UTI (urinary tract infection) 12/31/2021    Chronic anticoagulation 12/31/2021   Prolonged QT interval 12/31/2021   Multifocal pneumonia 12/30/2021   SVT (supraventricular tachycardia) (HCC) 12/11/2021   Obesity, Class I, BMI 30-34.9 12/10/2021   Left leg cellulitis 12/07/2021   Hyperkalemia 10/09/2021   Acquired hypothyroidism 10/09/2021   Hydronephrosis of right kidney 10/09/2021   Hypoglycemia 10/07/2021   Deep vein thrombosis (DVT) of left lower extremity (HCC) 10/07/2021   Acute renal failure superimposed on chronic kidney disease (HCC) 09/17/2021   CAP (community acquired pneumonia) 09/17/2021   Cellulitis of left lower extremity    Pressure injury of skin 06/03/2021   AKI (acute kidney injury) (HCC) 06/02/2021   Pain management    Cord compression (HCC)    Drug-induced constipation    Acute midline low back pain with sciatica    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) 08/30/2020   Urinary retention 12/04/2019   BPH (benign prostatic hyperplasia) 11/24/2019   CKD (chronic kidney disease), stage IV (HCC) 11/24/2019   Chronic pain syndrome 11/24/2019   Physical deconditioning 11/24/2019   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   Pulmonary emboli (HCC) 09/29/2018   [name], 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/2018   DM2 (diabetes mellitus, type 2) (HCC) 09/29/2018   Gout 09/29/2018   Essential hypertension 09/29/2018   Chronic combined systolic and diastolic congestive heart failure (HCC) 09/29/2018   GERD (gastroesophageal reflux disease) 09/29/2018   Past Medical History:  Diagnosis Date   Arthritis    BPH (benign prostatic hyperplasia) 11/24/2019   Chronic pain syndrome 11/24/2019   Diabetes mellitus without complication (HCC)    ESRD (end stage renal disease) (HCC)    on HD   Gout    Hypertension    Muscle pain    Physical deconditioning 11/24/2019   Scars    Swelling     Family History  Problem Relation Age of Onset   Heart disease Mother    Cancer Father     Past Surgical History:  Procedure  Laterality Date   AMPUTATION Left 02/17/2024   Procedure: AMPUTATION, FOOT, PARTIAL;  Surgeon: Timothy Ford, MD;  Location: [facility] OR;  Service: Orthopedics;  Laterality: Left;  LEFT TRANSMETATARSAL AMPUTATION   BELOW KNEE LEG AMPUTATION Right    ESOPHAGOGASTRODUODENOSCOPY (EGD) WITH PROPOFOL  N/A 11/25/2019   Procedure: ESOPHAGOGASTRODUODENOSCOPY (EGD) WITH PROPOFOL ;  Surgeon: Alvis Jourdain, MD;  Location: [facility] ENDOSCOPY;  Service: Endoscopy;  Laterality: N/A;   IR FLUORO GUIDE CV  LINE RIGHT  09/05/2019   IR LUMBAR DISC ASPIRATION W/IMG GUIDE  11/26/2019   IR US  GUIDE VASC ACCESS RIGHT  09/05/2019   RADIOLOGY WITH ANESTHESIA N/A 11/25/2019   Procedure: Thoracic and Lumbar MRI WITH ANESTHESIA;  Surgeon: Radiologist, Medication, MD;  Location: [facility] OR;  Service: Radiology;  Laterality: N/A;   Social History   Occupational History   Not on file  Tobacco Use   Smoking status: Never    Passive exposure: Never   Smokeless tobacco: Never  Vaping Use   Vaping status: Never Used  Substance and Sexual Activity   Alcohol use: No   Drug use: No   Sexual activity: Not Currently

## 2024-03-29 ENCOUNTER — Ambulatory Visit (INDEPENDENT_AMBULATORY_CARE_PROVIDER_SITE_OTHER): Admitting: Physician Assistant

## 2024-03-29 DIAGNOSIS — Z89432 Acquired absence of left foot: Secondary | ICD-10-CM

## 2024-03-30 ENCOUNTER — Emergency Department (HOSPITAL_COMMUNITY)

## 2024-03-30 ENCOUNTER — Encounter (HOSPITAL_COMMUNITY): Payer: Self-pay

## 2024-03-30 ENCOUNTER — Emergency Department (HOSPITAL_COMMUNITY)
Admission: EM | Admit: 2024-03-30 | Discharge: 2024-03-30 | Disposition: A | Discharge status: Home / Self Care | Attending: Emergency Medicine | Admitting: Emergency Medicine

## 2024-03-30 ENCOUNTER — Other Ambulatory Visit: Payer: Self-pay

## 2024-03-30 DIAGNOSIS — I5022 Chronic systolic (congestive) heart failure: Secondary | ICD-10-CM | POA: Insufficient documentation

## 2024-03-30 DIAGNOSIS — Z992 Dependence on renal dialysis: Secondary | ICD-10-CM | POA: Diagnosis not present

## 2024-03-30 DIAGNOSIS — Z7901 Long term (current) use of anticoagulants: Secondary | ICD-10-CM | POA: Diagnosis not present

## 2024-03-30 DIAGNOSIS — M546 Pain in thoracic spine: Secondary | ICD-10-CM | POA: Insufficient documentation

## 2024-03-30 DIAGNOSIS — R1110 Vomiting, unspecified: Secondary | ICD-10-CM | POA: Insufficient documentation

## 2024-03-30 DIAGNOSIS — N186 End stage renal disease: Secondary | ICD-10-CM | POA: Diagnosis not present

## 2024-03-30 DIAGNOSIS — M869 Osteomyelitis, unspecified: Secondary | ICD-10-CM

## 2024-03-30 LAB — RESP PANEL BY RT-PCR (RSV, FLU A&B, COVID)  RVPGX2
Influenza A by PCR: NEGATIVE
Influenza B by PCR: NEGATIVE
Resp Syncytial Virus by PCR: NEGATIVE
SARS Coronavirus 2 by RT PCR: NEGATIVE

## 2024-03-30 LAB — CBC WITH DIFFERENTIAL/PLATELET
Abs Immature Granulocytes: 0.03 10*3/uL (ref 0.00–0.07)
Basophils Absolute: 0 10*3/uL (ref 0.0–0.1)
Basophils Relative: 1 %
Eosinophils Absolute: 0.3 10*3/uL (ref 0.0–0.5)
Eosinophils Relative: 4 %
HCT: 26 % — ABNORMAL LOW (ref 39.0–52.0)
Hemoglobin: 8.2 g/dL — ABNORMAL LOW (ref 13.0–17.0)
Immature Granulocytes: 1 %
Lymphocytes Relative: 17 %
Lymphs Abs: 1.1 10*3/uL (ref 0.7–4.0)
MCH: 34.9 pg — ABNORMAL HIGH (ref 26.0–34.0)
MCHC: 31.5 g/dL (ref 30.0–36.0)
MCV: 110.6 fL — ABNORMAL HIGH (ref 80.0–100.0)
Monocytes Absolute: 0.7 10*3/uL (ref 0.1–1.0)
Monocytes Relative: 11 %
Neutro Abs: 4.4 10*3/uL (ref 1.7–7.7)
Neutrophils Relative %: 66 %
Platelets: 145 10*3/uL — ABNORMAL LOW (ref 150–400)
RBC: 2.35 MIL/uL — ABNORMAL LOW (ref 4.22–5.81)
RDW: 14.3 % (ref 11.5–15.5)
Smear Review: NORMAL
WBC: 6.5 10*3/uL (ref 4.0–10.5)
nRBC: 0 % (ref 0.0–0.2)

## 2024-03-30 LAB — COMPREHENSIVE METABOLIC PANEL WITH GFR
ALT: 11 U/L (ref 0–44)
AST: 24 U/L (ref 15–41)
Albumin: 3.6 g/dL (ref 3.5–5.0)
Alkaline Phosphatase: 64 U/L (ref 38–126)
Anion gap: 24 — ABNORMAL HIGH (ref 5–15)
BUN: 101 mg/dL — ABNORMAL HIGH (ref 8–23)
CO2: 14 mmol/L — ABNORMAL LOW (ref 22–32)
Calcium: 7.5 mg/dL — ABNORMAL LOW (ref 8.9–10.3)
Chloride: 103 mmol/L (ref 98–111)
Creatinine, Ser: 18.04 mg/dL — ABNORMAL HIGH (ref 0.61–1.24)
GFR, Estimated: 3 mL/min — ABNORMAL LOW (ref >60)
Glucose, Bld: 74 mg/dL (ref 70–99)
Potassium: 6.2 mmol/L — ABNORMAL HIGH (ref 3.5–5.1)
Sodium: 141 mmol/L (ref 135–145)
Total Bilirubin: 1.4 mg/dL — ABNORMAL HIGH (ref 0.0–1.2)
Total Protein: 8 g/dL (ref 6.5–8.1)

## 2024-03-30 LAB — LIPASE, BLOOD: Lipase: 73 U/L — ABNORMAL HIGH (ref 11–51)

## 2024-03-30 LAB — TROPONIN I (HIGH SENSITIVITY): Troponin I (High Sensitivity): 12 ng/L (ref <18)

## 2024-03-30 LAB — C-REACTIVE PROTEIN: CRP: <0.5 mg/dL (ref <1.0)

## 2024-03-30 LAB — VANCOMYCIN, RANDOM: Vancomycin Rm: 14 ug/mL

## 2024-03-30 LAB — SEDIMENTATION RATE: Sed Rate: 33 mm/h — ABNORMAL HIGH (ref 0–16)

## 2024-03-30 MED ORDER — HYDROMORPHONE HCL 1 MG/ML IJ SOLN
0.5000 mg | Freq: Once | INTRAMUSCULAR | Status: AC
  Administered 2024-03-30: 0.5 mg via INTRAVENOUS
  Filled 2024-03-30: qty 1

## 2024-03-30 MED ORDER — LOKELMA 10 G PO PACK: 10.0000 g | PACK | Freq: Two times a day (BID) | ORAL | 0 refills | Status: DC

## 2024-03-30 MED ORDER — SODIUM ZIRCONIUM CYCLOSILICATE 10 G PO PACK
10.0000 g | PACK | Freq: Once | ORAL | Status: AC
  Administered 2024-03-30: 10 g via ORAL
  Filled 2024-03-30: qty 1

## 2024-03-30 MED ORDER — VANCOMYCIN HCL 2000 MG/400ML IV SOLN: 2000.0000 mg | Freq: Once | INTRAVENOUS | Status: DC

## 2024-03-30 MED ORDER — SODIUM CHLORIDE 0.9 % IV SOLN: 2.0000 g | Freq: Once | INTRAVENOUS | Status: DC

## 2024-03-30 MED ORDER — SODIUM CHLORIDE 0.9 % IV SOLN
1.0000 g | Freq: Once | INTRAVENOUS | Status: AC
  Administered 2024-03-30: 1 g via INTRAVENOUS
  Filled 2024-03-30: qty 1

## 2024-03-30 NOTE — ED Provider Notes (Signed)
 Has osteomyelitis, refusing dialysis and therefore not getting antibiotics. Getting chills now and agreed to come to the hospital. Needs resumption of dialysis and antibiotics.F/U metabolic  Physical Exam  BP 122/87   Pulse 69   Temp 97.8 F (36.6 C) (Oral)   Resp 11   Wt 91.9 kg   SpO2 100%   BMI 24.03 kg/m   Physical Exam  Procedures  Procedures  ED Course / MDM    Medical Decision Making Amount and/or Complexity of Data Reviewed Labs: ordered. Radiology: ordered.  Risk Prescription drug management.   Patient's potassium 6.2 BUN 101 creatinine 18 white count 6.5 hemoglobin 8.2 hematocrit 26  Patient CT shows similar chronic and ongoing underlying medical problems of severe chronic hydronephrosis on the right and similar appearing T9-T11 sclerotic changes with paraspinal soft tissue thickening concerning for osteomyelitis\discitis.  Consult: Reviewed with nephrology Dr.Bhandari.  At this time appropriate to treat potassium with Lokelma .  He recommends 10 g twice daily and patient to be rescheduled for his dialysis ideally tomorrow or the next day.  At this time, patient's antibiotics have been administered with cefepime  and vancomycin .  CT does not show any acute or evolving changes.  Patient is nontoxic and alert.  No acute distress.  He has been eating and is not showing any evidence of respiratory distress.  CT scan does not show vascular congestion.  After consultation with nephrology plan in place for patient to go back to SNF and resume dialysis and antibiotic therapy as previously.       Wynetta Heckle, MD 03/30/24 2009

## 2024-03-30 NOTE — Discharge Instructions (Addendum)
 1.  Patient has been given a dose of cefepime  and Vancomycin  in the emergency department.  These antibiotics are to treat osteomyelitis.  Patient should continue his antibiotic therapy as previously arranged on outpatient basis with dialysis. 2.  Patient can continue his outpatient dialysis ideally with the rescheduling tomorrow.  Patient is to take Lokelma  twice a day for potassium elevation.  Patient was given a 10 g dose of Lokelma  in the emergency department.  He will need his next dose tomorrow morning unless he is in dialysis.  A prescription has been provided. 3.  Patient should continue with all of his other routine medical care and close follow-up with his primary doctor.

## 2024-03-30 NOTE — ED Triage Notes (Signed)
 Pt bib ems from [facility] for emesis and chronic back pain. Pt also has refused dialysis for the past 1.5 weeks. (Last on 6/2) Pt given 25 mcg fentanyl  and 4mg  zofran  IV PTA

## 2024-03-30 NOTE — ED Provider Notes (Signed)
 [place] EMERGENCY DEPARTMENT AT [facility] Provider Note   CSN: [id number] Arrival date & time: 03/30/24  1202     History  Chief Complaint  Patient presents with   Emesis    Thomas [name] is a 73 y.o. male.   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  He underwent TMA for osteomyelitis of the left foot as well during that hospitalization.  Plan had been for outpatient antibiotics during dialysis however the patient refused dialysis over the past week.  States that he developed emesis as well as worsening mid back pain.  His last dialysis session was on 6/2.  He was administered 25 mcg of fentanyl  and 4 mg of Zofran  prior to arrival.  He denies any chest pain or shortness of breath.  Home Medications Prior to Admission medications   Medication Sig Start Date End Date Taking? Authorizing Provider  apixaban  (ELIQUIS ) 5 MG TABS tablet Take 1 tablet (5 mg total) by mouth 2 (two) times daily. 03/15/24   Samtani, Jai-Gurmukh, MD  atorvastatin  (LIPITOR ) 80 MG tablet Take 80 mg by mouth daily.    [provider]  bethanechol  (URECHOLINE ) 10 MG tablet Take 10 mg by mouth 2 (two) times daily. 12/21/23   [provider]  bisacodyl  (DULCOLAX) 10 MG suppository Place 1 suppository (10 mg total) rectally daily as needed for moderate constipation. 03/15/24   Samtani, Jai-Gurmukh, MD  ceFEPime  (MAXIPIME ) IVPB Inject 2 g into the vein Every  Tuesday,Thursday,and Saturday with dialysis. Indication:  Discitis/osteo Last Day of Therapy:  04/06/24 Labs - Once weekly:  CBC/D and BMP, ESR and CRP Method of administration: Per hemodialysis protocol, to be given at HD center= 03/17/24 04/27/24  Liane Redman, MD  Cholecalciferol  125 MCG (5000 UT) capsule Take 5,000 Units by mouth daily.    [provider]  cyanocobalamin  (VITAMIN B12) 1000 MCG tablet Take 1 tablet by mouth daily.    [provider]  lactulose  (CHRONULAC ) 10 GM/15ML solution Take 45 mLs (30 g total) by mouth daily. 03/15/24   Samtani, Jai-Gurmukh, MD  levothyroxine  (SYNTHROID ) 50 MCG tablet Take 50 mcg by mouth daily before breakfast.     [provider]  midodrine  (PROAMATINE ) 5 MG tablet Take 1 tablet (5 mg total) by mouth 3 (three) times daily with meals. 03/15/24   Samtani, Jai-Gurmukh, MD  OXYCONTIN  30 MG 12 hr tablet Take 1 tablet (30 mg total) by mouth 3 (three) times daily as needed (pain). 03/16/24   Kraig Peru, MD  senna-docusate (SENOKOT-S) 8.6-50 MG tablet Take 4 tablets by mouth 2 (two) times daily. 11/04/22   [provider]  sevelamer  (RENAGEL ) 800 MG tablet Take 1,600 mg by mouth 3 (three) times daily. 08/27/23   [provider]  tamsulosin  (FLOMAX ) 0.4 MG CAPS capsule Take 1 capsule (0.4 mg total) by mouth daily. 09/25/20   Avi Body, MD  tiZANidine  (ZANAFLEX ) 4 MG tablet Take 4 mg by mouth every 8 (eight) hours as needed for muscle spasms. 11/26/21   [provider]  vancomycin  IVPB Inject 750 mg into the vein Every Tuesday,Thursday,and Saturday with dialysis. Indication:  Discitis/osteo Last Day of Therapy:  04/06/24 Labs - Once weekly:  CBC/D, BMP, and pre-HD Vanc level (goal 15-25 mcg/ml), ESR and CRP Method of administration: Per hemodialysis protocol - to be given at HD center 03/17/24 04/15/24  Liane Redman, MD  Vitamin E 670 MG (1000 UT) CAPS Take 1,000 mg by mouth daily.    [provider]       Allergies    Other, Oxymorphone, Amitriptyline, Aspirin, Beta vulgaris, Buspirone, Cabbage, Codeine, Cyclobenzaprine, Diflunisal, Fish allergy, Fish-derived products, Hydroxyzine, Levetiracetam, Methadone, Nalbuphine, Pentazocine, Propoxyphene, Shellfish allergy, Sulfa antibiotics, Sulfasalazine, and Amoxicillin     Review of Systems   Review of Systems  Gastrointestinal:  Positive for vomiting.  Musculoskeletal:  Positive for back pain.  All other systems reviewed and are negative.   Physical Exam Updated Vital Signs BP 122/87   Pulse 78   Temp 97.8 F (36.6 C) (Oral)   Resp 18   SpO2 98%  Physical Exam Vitals and nursing note reviewed.  Constitutional:      General: He is not in acute distress.    Appearance: He is well-developed.  HENT:     Head: Normocephalic and atraumatic.   Eyes:     Conjunctiva/sclera: Conjunctivae normal.    Cardiovascular:     Rate and Rhythm: Normal rate and regular rhythm.     Heart sounds: No murmur heard. Pulmonary:     Effort: Pulmonary effort is normal. No respiratory distress.     Breath sounds: Normal breath sounds.  Abdominal:     Palpations: Abdomen is soft.     Tenderness: There is no abdominal tenderness.   Musculoskeletal:        General: No swelling.     Cervical back: Neck supple.     Comments: Left foot status post partial amputation, surgical site appears to be healing well without erythema or drainage.  Erythema along the anterior tibia with skin tears present, right lower extremity BKA.  Tenderness to palpation of the thoracic spine   Skin:    General: Skin is warm and dry.     Capillary Refill: Capillary refill takes less than 2 seconds.   Neurological:     Mental Status: He is alert.     Comments: No cranial nerve deficit, 5 out of 5 strength in the bilateral upper and lower extremities.  Psychiatric:        Mood and Affect: Mood normal.     ED Results / Procedures / Treatments   Labs (all labs ordered  are listed, but only abnormal results are displayed) Labs Reviewed - No data to display  EKG None  Radiology No results found.  Procedures Procedures    Medications Ordered in ED Medications - No data to display  ED Course/ Medical Decision Making/ A&P                                 Medical Decision Making Amount and/or Complexity of Data Reviewed Labs: ordered. Radiology: ordered.  Risk Prescription drug management.    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  He underwent TMA for osteomyelitis of the left foot as well during that hospitalization.  Plan had been for outpatient antibiotics during dialysis however the patient refused dialysis over the past week.  States that he developed emesis as well as worsening mid back pain.  His last dialysis session was on 6/2.  He was administered 25 mcg of fentanyl  and 4 mg of Zofran  prior to arrival.  He denies any chest pain or shortness of breath.  Patient states that he has been having chills as well as well as whole body aches.  On arrival, the patient was vitally stable, afebrile, not tachycardic or tachypneic, hemodynamically stable.  Physical exam revealed evidence of erythema, along the anterior tibia, postop site appears to be C/D/I. Concern for uncontrolled infection of the spine given the patient's refusal of dialysis as this was going to be the primary mode of antibiotic administration in the outpatient  setting.  Will obtain x-ray imaging, CT imaging, labs and reassess.  XR Tib/Fib: Neg  CT Chest Abdomen Pelvis:  IMPRESSION:  1. No acute intrathoracic, abdominal, or pelvic pathology.  2. Severe chronic right hydronephrosis and hydroureter with atrophy  of the right renal parenchyma.  3. Similar appearance of T9-T11 sclerotic changes with paraspinal  soft tissue thickening concerning for osteomyelitis/discitis.  Clinical correlation is recommended. A  4. Bilateral femoral head avascular necrosis.  5.  Aortic Atherosclerosis (ICD10-I70.0).   Labs: ESR elevated to 33, COVID, flu, RSV PCR testing negative, lipase nonspecifically elevated to 73, CBC without a leukocytosis, mild anemia at 8.2 down 1 point from the patient's apparent baseline few weeks ago, troponin normal, CRP pending.  CMP was pending at time of signout, plan to follow-up with CMP to determine if the patient needs emergent dialysis at this time, if not, patient would likely not benefit from inpatient hospitalization as an outpatient antibiotic plan is in place with dialysis if the patient is willing to be compliant with dialysis.  Patient was administered broad-spectrum antibiotics to include IV cefepime .  Signout given to Dr. Daivd Dub at [phone].    Final Clinical Impression(s) / ED Diagnoses Final diagnoses:  None    Rx / DC Orders ED Discharge Orders     None         Rosealee Concha, MD 04/02/24 1118

## 2024-03-30 NOTE — Progress Notes (Signed)
 Pharmacy Antibiotic Note  Thomas [name] is a 73 y.o. male admitted on 03/30/2024 with concern for osteomyelitis. Prior to admission, patient was had OPAT for treatment of thoracic discitis with cefepime  2g + vancomycin  750mg  TThS with HD (set to end 6/18). Noted to have refused HD since 6/2. Pharmacy has been consulted for vancomycin  and cefepime  dosing.  VR back at 14.  Plan: Patient has left via [facility]. Plan is for patient to go to HD tomorrow. Discussed with MD  Plan to resume outpatient regimen - cefepime  2g TThS with HD and vancomycin  750mg  TThS with HD Vancomycin  level now to see if patient needs any additional loading dose F/u dialysis plans and recommendations on length of therapy  Weight: 91.9 kg (202 lb 9.6 oz)  Temp (24hrs), Avg:97.8 F (36.6 C), Min:97.8 F (36.6 C), Max:97.8 F (36.6 C)  Recent Labs  Lab 03/30/24 1315 03/30/24 1535  WBC 6.5  --   CREATININE  --  18.04*    Estimated Creatinine Clearance: 4.8 mL/min (A) (by C-G formula based on SCr of 18.04 mg/dL (H)).    Allergies  Allergen Reactions   Other Other (See Comments)    Blood pressure issues Per [facility] hospital: Pt states he can only take these pain meds or else he gets very sick: Oxycontin ,morphine ,demerol, and dilaudid  are the only pain meds pt states he can take.     Oxymorphone Other (See Comments)    Causes kidney problems   Amitriptyline Other (See Comments)   Aspirin Nausea And Vomiting    Gi upset, has kidney issues   Beta Vulgaris Nausea And Vomiting   Buspirone Other (See Comments)    Affected his head    Cabbage Nausea And Vomiting   Codeine Other (See Comments)    Pt does not remember the reaction but knows he can not take    Cyclobenzaprine Nausea And Vomiting   Diflunisal Nausea And Vomiting and Other (See Comments)   Fish Allergy Nausea And Vomiting   Fish-Derived Products Nausea And Vomiting   Hydroxyzine Nausea And Vomiting   Levetiracetam Other (See Comments)   Methadone  Other (See Comments)    Made him loopy   Nalbuphine Other (See Comments)    Cutaneous eruption (morphologic abnormality)   Pentazocine Other (See Comments)    Pt does not remember the reaction    Propoxyphene Other (See Comments)    Pt does not remember the reaction     Shellfish Allergy Nausea And Vomiting   Sulfa Antibiotics Hives   Sulfasalazine Other (See Comments)    Pt does not remember the reaction   Amoxicillin  Nausea And Vomiting and Rash    Per [facility]    Antimicrobials this admission: Vanc 6/11> (planned end date 6/18) Cefepime  6/11 > (planned end date 6/18)  Thank you for allowing pharmacy to be a part of this patient's care.  Shaun Deiters 03/30/2024 5:45 PM

## 2024-03-30 NOTE — ED Notes (Signed)
 [facility] called

## 2024-04-01 DIAGNOSIS — R9431 Abnormal electrocardiogram [ECG] [EKG]: Secondary | ICD-10-CM | POA: Diagnosis not present

## 2024-04-07 ENCOUNTER — Inpatient Hospital Stay: Admitting: Internal Medicine

## 2024-04-07 ENCOUNTER — Telehealth: Payer: Self-pay

## 2024-04-07 NOTE — Telephone Encounter (Signed)
 Per provider ok to PULL PICC after End Date.   Provider: Cephas Collier MD  End Date: 04/06/24   Attempted to contact patient to reschedule NO SHOW today 04/07/24 but vm full.  Need to notify him that his appointment to follow up is now on:  04/28/24 at 10:30 AM. Will ask [name] to see if [facility] can have patient call [facility] to confirm appt.  Forwarded to [facility] Triage to continue contact attempts.    Notified [facility] and [facility].

## 2024-04-08 ENCOUNTER — Telehealth: Payer: Self-pay

## 2024-04-08 ENCOUNTER — Encounter: Payer: Self-pay | Admitting: Internal Medicine

## 2024-04-08 NOTE — Telephone Encounter (Signed)
 [name] Deeann Fare messaged me that patient is at [facility]- I called them to inform he should have PICC removed and that he has new follow up 04/28/24 since he missed his appointment on 04/07/24. EOT was 04/06/24. Diane with [facility] said he was D/C on 03/31/24 and could not confirm if he has PICC line or where he went after D/C. She thinks he may be at a different facility.   Patient vm full. Left message with his Emergency Contact his nephew will await a call back and attach [facility] Pharmacy team for assistance in locating patient to confirm PICC was removed.   [facility] also informed.

## 2024-04-08 NOTE — Telephone Encounter (Signed)
 Patient is currently admitted at [facility] and should of been in a SNF prior to his admission. As of today he has been in admitted to the hospital for 2 weeks. Please follow up with [facility] if needed. You may want to cancel the appointment on 04/28/24.  Unsure when he will be discharged and where he will be discharged to. Thomas [name], CMA

## 2024-04-12 DIAGNOSIS — E876 Hypokalemia: Secondary | ICD-10-CM | POA: Diagnosis not present

## 2024-04-26 ENCOUNTER — Ambulatory Visit: Admitting: Orthopedic Surgery

## 2024-04-28 ENCOUNTER — Ambulatory Visit: Admitting: Internal Medicine

## 2024-07-22 ENCOUNTER — Other Ambulatory Visit: Payer: Self-pay

## 2024-07-22 ENCOUNTER — Ambulatory Visit (HOSPITAL_COMMUNITY)
Admission: RE | Admit: 2024-07-22 | Discharge: 2024-07-22 | Disposition: A | Discharge status: Home / Self Care | Attending: Vascular Surgery | Admitting: Vascular Surgery

## 2024-07-22 ENCOUNTER — Encounter (HOSPITAL_COMMUNITY)
Admission: RE | Disposition: A | Discharge status: Home / Self Care | Payer: Self-pay | Source: Home / Self Care | Attending: Vascular Surgery

## 2024-07-22 DIAGNOSIS — N186 End stage renal disease: Secondary | ICD-10-CM | POA: Insufficient documentation

## 2024-07-22 DIAGNOSIS — Z79899 Other long term (current) drug therapy: Secondary | ICD-10-CM | POA: Diagnosis not present

## 2024-07-22 DIAGNOSIS — Y839 Surgical procedure, unspecified as the cause of abnormal reaction of the patient, or of later complication, without mention of misadventure at the time of the procedure: Secondary | ICD-10-CM | POA: Diagnosis not present

## 2024-07-22 DIAGNOSIS — E1122 Type 2 diabetes mellitus with diabetic chronic kidney disease: Secondary | ICD-10-CM | POA: Insufficient documentation

## 2024-07-22 DIAGNOSIS — T8241XA Breakdown (mechanical) of vascular dialysis catheter, initial encounter: Secondary | ICD-10-CM | POA: Diagnosis present

## 2024-07-22 DIAGNOSIS — Z992 Dependence on renal dialysis: Secondary | ICD-10-CM | POA: Diagnosis not present

## 2024-07-22 DIAGNOSIS — I120 Hypertensive chronic kidney disease with stage 5 chronic kidney disease or end stage renal disease: Secondary | ICD-10-CM | POA: Diagnosis not present

## 2024-07-22 SURGERY — TUNNELLED CATHETER EXCHANGE
Anesthesia: LOCAL

## 2024-07-22 MED ORDER — HEPARIN (PORCINE) IN NACL 1000-0.9 UT/500ML-% IV SOLN
INTRAVENOUS | Status: DC | PRN
  Administered 2024-07-22: 500 mL

## 2024-07-22 MED ORDER — HEPARIN SODIUM (PORCINE) 1000 UNIT/ML IJ SOLN
INTRAMUSCULAR | Status: AC
  Filled 2024-07-22: qty 10

## 2024-07-22 MED ORDER — LIDOCAINE HCL (PF) 1 % IJ SOLN
INTRAMUSCULAR | Status: DC | PRN
  Administered 2024-07-22: 20 mL

## 2024-07-22 MED ORDER — LIDOCAINE HCL (PF) 1 % IJ SOLN
INTRAMUSCULAR | Status: AC
  Filled 2024-07-22: qty 30

## 2024-07-22 MED ORDER — HEPARIN SODIUM (PORCINE) 1000 UNIT/ML IJ SOLN
INTRAMUSCULAR | Status: DC | PRN
  Administered 2024-07-22: 3200 [IU] via INTRAVENOUS

## 2024-07-22 SURGICAL SUPPLY — 5 items
CATH PALINDROME 19 SP (CATHETERS) IMPLANT
GLIDEWIRE ADV .035X180CM (WIRE) IMPLANT
KIT PV (KITS) ×1 IMPLANT
MAT PREVALON FULL STRYKER (MISCELLANEOUS) IMPLANT
TRAY PV CATH (CUSTOM PROCEDURE TRAY) ×1 IMPLANT

## 2024-07-22 NOTE — H&P (Signed)
 H&P     History of Present Illness: This is a 73 y.o. male with end-stage renal disease that presents for [facility] catheter exchange.  He has a right IJ [facility] that he states was placed 1 year ago in October 2024 at [facility].  States that this broke yesterday.  Past Medical History:  Diagnosis Date   Arthritis    BPH (benign prostatic hyperplasia) 11/24/2019   Chronic pain syndrome 11/24/2019   Diabetes mellitus without complication (HCC)    ESRD (end stage renal disease) (HCC)    on HD   Gout    Hypertension    Muscle pain    Physical deconditioning 11/24/2019   Scars    Swelling     Past Surgical History:  Procedure Laterality Date   AMPUTATION Left 02/17/2024   Procedure: AMPUTATION, FOOT, PARTIAL;  Surgeon: Harden Jerona GAILS, MD;  Location: [facility] OR;  Service: Orthopedics;  Laterality: Left;  LEFT TRANSMETATARSAL AMPUTATION   BELOW KNEE LEG AMPUTATION Right    ESOPHAGOGASTRODUODENOSCOPY (EGD) WITH PROPOFOL  N/A 11/25/2019   Procedure: ESOPHAGOGASTRODUODENOSCOPY (EGD) WITH PROPOFOL ;  Surgeon: Rollin Dover, MD;  Location: [facility] ENDOSCOPY;  Service: Endoscopy;  Laterality: N/A;   IR FLUORO GUIDE CV LINE RIGHT  09/05/2019   IR LUMBAR DISC ASPIRATION W/IMG GUIDE  11/26/2019   IR US  GUIDE VASC ACCESS RIGHT  09/05/2019   RADIOLOGY WITH ANESTHESIA N/A 11/25/2019   Procedure: Thoracic and Lumbar MRI WITH ANESTHESIA;  Surgeon: Radiologist, Medication, MD;  Location: [facility] OR;  Service: Radiology;  Laterality: N/A;    Allergies  Allergen Reactions   Other Other (See Comments)    Blood pressure issues Per [facility] hospital: Pt states he can only take these pain meds or else he gets very sick: Oxycontin ,morphine ,demerol, and dilaudid  are the only pain meds pt states he can take.     Oxymorphone Other (See Comments)    Causes kidney problems   Amitriptyline Other (See Comments)   Aspirin Nausea And Vomiting    Gi upset, has kidney issues   Beta Vulgaris Nausea And Vomiting   Buspirone Other (See  Comments)    Affected his head    Cabbage Nausea And Vomiting   Codeine Other (See Comments)    Pt does not remember the reaction but knows he can not take    Cyclobenzaprine Nausea And Vomiting   Diflunisal Nausea And Vomiting and Other (See Comments)   Fish Allergy Nausea And Vomiting   Fish-Derived Products Nausea And Vomiting   Hydroxyzine Nausea And Vomiting   Levetiracetam Other (See Comments)   Methadone Other (See Comments)    Made him loopy   Nalbuphine Other (See Comments)    Cutaneous eruption (morphologic abnormality)   Pentazocine Other (See Comments)    Pt does not remember the reaction    Propoxyphene Other (See Comments)    Pt does not remember the reaction     Shellfish Allergy Nausea And Vomiting   Sulfa Antibiotics Hives   Sulfasalazine Other (See Comments)    Pt does not remember the reaction   Amoxicillin  Nausea And Vomiting and Rash    Per [facility]    Prior to Admission medications   Medication Sig Start Date End Date Taking? Authorizing Provider  apixaban  (ELIQUIS ) 5 MG TABS tablet Take 1 tablet (5 mg total) by mouth 2 (two) times daily. 03/15/24   Samtani, Jai-Gurmukh, MD  atorvastatin  (LIPITOR ) 80 MG tablet Take 80 mg by mouth at bedtime.    [provider]  bisacodyl  (DULCOLAX) 10 MG suppository Place 1 suppository (10 mg total) rectally daily as needed for moderate constipation. 03/15/24   Samtani, Jai-Gurmukh, MD  Cholecalciferol  125 MCG (5000 UT) capsule Take 5,000 Units by mouth daily.    [provider]  cyanocobalamin  (VITAMIN B12) 1000 MCG tablet Take 1 tablet by mouth daily.    [provider]  HYDROmorphone  (DILAUDID ) 4 MG tablet Take 4 mg by mouth See admin instructions. Give one tablet by mouth in the morning on Monday, Wednesdays and Fridays dialysis days only    [provider]  lactulose  (CHRONULAC ) 10 GM/15ML solution Take 45 mLs (30 g total) by mouth daily. 03/15/24   Samtani, Jai-Gurmukh, MD   levothyroxine  (SYNTHROID ) 50 MCG tablet Take 50 mcg by mouth daily before breakfast.     [provider]  midodrine  (PROAMATINE ) 5 MG tablet Take 1 tablet (5 mg total) by mouth 3 (three) times daily with meals. 03/15/24   Samtani, Jai-Gurmukh, MD  Nutritional Supplements (NEPRO PO) Take 240 mLs by mouth 2 (two) times daily.    [provider]  ondansetron  (ZOFRAN ) 4 MG tablet Take 4 mg by mouth every 4 (four) hours as needed for nausea or vomiting.    [provider]  oxyCODONE  (OXYCONTIN ) 20 mg 12 hr tablet Take 20 mg by mouth every 12 (twelve) hours.    [provider]  senna-docusate (SENOKOT-S) 8.6-50 MG tablet Take 4 tablets by mouth 2 (two) times daily. 11/04/22   [provider]  sevelamer  (RENAGEL ) 800 MG tablet Take 1,600 mg by mouth 3 (three) times daily. 08/27/23   [provider]  sodium zirconium cyclosilicate  (LOKELMA ) 10 g PACK packet Take 10 g by mouth 2 (two) times daily. 03/30/24   Armenta Canning, MD  tamsulosin  (FLOMAX ) 0.4 MG CAPS capsule Take 1 capsule (0.4 mg total) by mouth daily. Patient taking differently: Take 0.4 mg by mouth every evening. 09/25/20   Arnett Saunders, MD  tiZANidine  (ZANAFLEX ) 4 MG tablet Take 4 mg by mouth every 8 (eight) hours as needed for muscle spasms. 11/26/21   [provider]  Vitamin E 670 MG (1000 UT) CAPS Take 1,000 mg by mouth daily.    [provider]    Social History   Socioeconomic History   Marital status: Single    Spouse name: Not on file   Number of children: Not on file   Years of education: Not on file   Highest education level: Not on file  Occupational History   Not on file  Tobacco Use   Smoking status: Never    Passive exposure: Never   Smokeless tobacco: Never  Vaping Use   Vaping status: Never Used  Substance and Sexual Activity   Alcohol use: No   Drug use: No   Sexual activity: Not Currently  Other Topics Concern   Not on file  Social History  Narrative   Not on file   Social Drivers of Health   Financial Resource Strain: Medium Risk (04/11/2022)   Received from [facility]   Overall Financial Resource Strain (CARDIA)    Difficulty of Paying Living Expenses: Somewhat hard  Food Insecurity: Low Risk  (04/19/2024)   Received from [facility]   Hunger Vital Sign    Within the past 12 months, you worried that your food would run out before you got money to buy more: Never true    Within the past 12 months, the food you bought just didn't last and you  didn't have money to get more. : Never true  Transportation Needs: No Transportation Needs (04/19/2024)   Received from [facility]    In the past 12 months, has lack of reliable transportation kept you from medical appointments, meetings, work or from getting things needed for daily living? : No  Physical Activity: Not on file  Stress: Stress Concern Present (04/11/2022)   Received from [facility] - Occupational Stress Questionnaire    Feeling of Stress : To some extent  Social Connections: Socially Isolated (02/11/2024)   Social Connection and Isolation Panel    Frequency of Communication with Friends and Family: More than three times a week    Frequency of Social Gatherings with Friends and Family: Once a week    Attends Religious Services: Never    Database administrator or Organizations: No    Attends Banker Meetings: Never    Marital Status: Never married  Intimate Partner Violence: Not At Risk (05/10/2024)   Received from [facility]   HITS    Over the last 12 months how often did your partner physically hurt you?: Never    Over the last 12 months how often did your partner insult you or talk down to you?: Never    Over the last 12 months how often did your partner threaten you with physical harm?: Never    Over the last 12 months how often did your partner scream or curse at you?: Never      Family History  Problem Relation Age of Onset   Heart disease Mother    Cancer Father     ROS: [x]  Positive   [ ]  Negative   [ ]  All sytems reviewed and are negative  Cardiovascular: []  chest pain/pressure []  palpitations []  SOB lying flat []  DOE []  pain in legs while walking []  pain in legs at rest []  pain in legs at night []  non-healing ulcers []  hx of DVT []  swelling in legs  Pulmonary: []  productive cough []  asthma/wheezing []  home O2  Neurologic: []  weakness in []  arms []  legs []  numbness in []  arms []  legs []  hx of CVA []  mini stroke [] difficulty speaking or slurred speech []  temporary loss of vision in one eye []  dizziness  Hematologic: []  hx of cancer []  bleeding problems []  problems with blood clotting easily  Endocrine:   []  diabetes []  thyroid disease  GI []  vomiting blood []  blood in stool  GU: []  CKD/renal failure []  HD--[]  M/W/F or []  T/T/S []  burning with urination []  blood in urine  Psychiatric: []  anxiety []  depression  Musculoskeletal: []  arthritis []  joint pain  Integumentary: []  rashes []  ulcers  Constitutional: []  fever []  chills   Physical Examination  There were no vitals filed for this visit. There is no height or weight on file to calculate BMI.  General:  NAD Gait: Not observed HENT: WNL, normocephalic Pulmonary: normal non-labored breathing Cardiac: regular, without  Murmurs, rubs or gallops Abdomen:  soft, NT/ND Vascular Exam/Pulses: RIJ TDC  CBC    Component Value Date/Time   WBC 6.5 03/30/2024 1315   RBC 2.35 (L) 03/30/2024 1315   HGB 8.2 (L) 03/30/2024 1315   HCT 26.0 (L) 03/30/2024 1315   PLT 145 (L) 03/30/2024 1315   MCV 110.6 (H) 03/30/2024 1315   MCH 34.9 (H) 03/30/2024 1315   MCHC 31.5 03/30/2024 1315   RDW 14.3 03/30/2024 1315   LYMPHSABS  1.1 03/30/2024 1315   MONOABS 0.7 03/30/2024 1315   EOSABS 0.3 03/30/2024 1315   BASOSABS 0.0 03/30/2024 1315    BMET    Component Value  Date/Time   NA 141 03/30/2024 1535   K 6.2 (H) 03/30/2024 1535   CL 103 03/30/2024 1535   CO2 14 (L) 03/30/2024 1535   GLUCOSE 74 03/30/2024 1535   BUN 101 (H) 03/30/2024 1535   CREATININE 18.04 (H) 03/30/2024 1535   CREATININE 5.99 (H) 01/23/2020 1430   CALCIUM  7.5 (L) 03/30/2024 1535   GFRNONAA 3 (L) 03/30/2024 1535   GFRAA 14 (L) 02/08/2020 1138    COAGS: Lab Results  Component Value Date   INR 1.1 02/09/2024   INR 1.1 02/08/2024   INR 1.3 (H) 01/06/2024     Non-Invasive Vascular Imaging:      ASSESSMENT/PLAN: This is a 73 y.o. male with end-stage renal disease that presents for [facility] catheter exchange.  He has a right IJ [facility] that he states was placed 1 year ago in October 2024 at [facility].  Discussed plan to inject local around the catheter and exchange over a wire in the Cath Lab under fluoroscopy.  All questions answered.  Lonni DOROTHA Gaskins, MD [facility] Office: [phone]  Lonni JINNY Gaskins

## 2024-07-22 NOTE — Op Note (Signed)
    Patient name: EBERT FORRESTER MRN: [id number] DOB: 07/15/1951 Sex: male  07/22/2024 Pre-operative Diagnosis: Malfunctioning right IJ TDC Post-operative diagnosis:  Same Surgeon:  Lonni DOROTHA Gaskins, MD Procedure Performed: 1.  Right IJ tunneled dialysis catheter exchange (19 cm palindrome) under fluoroscopic guidance  Indications: Patient is a 73 year old male with end-stage renal disease currently using a right IJ TDC.  He presents today for catheter exchange as the catheter broke yesterday.  Risk benefits discussed.  Findings: The right IJ TDC was exchanged under fluoroscopic guidance over a wire for a new 19 cm palindrome catheter with the tip in the right atrium.  This flushed and aspirated easily.   Procedure:  The patient was identified in the holding area and taken to [facility] PV lab.  Placed on the table in the supine position.  The existing right neck catheter was prepped and draped in standard sterile fashion.  Timeout was performed.  I used about 20 mL of 1% lidocaine  without epinephrine  to anesthetize the cuff and skin entrance site.  I then used a hemostat with Metzenbaum scissors and freed up the cuff under the skin with blunt dissection.  Glidewire advantage was advanced through the existing catheter under fluoroscopic guidance and the previous catheter was removed entirely.  I placed a new 19 cm palindrome catheter into the right atrium under fluoroscopic guidance over the wire.  The wire and inner cannulas were removed.  This flushed and aspirated easily.  It was loaded with heparin  according to manufactures recommendations.  Secured with multiple 2-0 nylon's and Dermabond.  Taken to holding with no complications.    Lonni DOROTHA Gaskins, MD [facility] Office: [phone]

## 2024-07-25 ENCOUNTER — Encounter (HOSPITAL_COMMUNITY): Payer: Self-pay | Admitting: Vascular Surgery

## 2024-08-13 ENCOUNTER — Encounter (HOSPITAL_COMMUNITY): Payer: Self-pay | Admitting: Emergency Medicine

## 2024-08-13 ENCOUNTER — Emergency Department (HOSPITAL_COMMUNITY)
Admission: EM | Admit: 2024-08-13 | Discharge: 2024-08-24 | Disposition: A | Discharge status: Home / Self Care | Attending: Emergency Medicine | Admitting: Emergency Medicine

## 2024-08-13 ENCOUNTER — Other Ambulatory Visit: Payer: Self-pay

## 2024-08-13 DIAGNOSIS — Z79899 Other long term (current) drug therapy: Secondary | ICD-10-CM | POA: Diagnosis not present

## 2024-08-13 DIAGNOSIS — I120 Hypertensive chronic kidney disease with stage 5 chronic kidney disease or end stage renal disease: Secondary | ICD-10-CM | POA: Diagnosis not present

## 2024-08-13 DIAGNOSIS — M6281 Muscle weakness (generalized): Secondary | ICD-10-CM | POA: Insufficient documentation

## 2024-08-13 DIAGNOSIS — M549 Dorsalgia, unspecified: Secondary | ICD-10-CM | POA: Diagnosis present

## 2024-08-13 DIAGNOSIS — N186 End stage renal disease: Secondary | ICD-10-CM | POA: Diagnosis not present

## 2024-08-13 DIAGNOSIS — Z992 Dependence on renal dialysis: Secondary | ICD-10-CM | POA: Insufficient documentation

## 2024-08-13 DIAGNOSIS — R2689 Other abnormalities of gait and mobility: Secondary | ICD-10-CM | POA: Diagnosis not present

## 2024-08-13 DIAGNOSIS — W1830XA Fall on same level, unspecified, initial encounter: Secondary | ICD-10-CM | POA: Diagnosis not present

## 2024-08-13 DIAGNOSIS — E1122 Type 2 diabetes mellitus with diabetic chronic kidney disease: Secondary | ICD-10-CM | POA: Diagnosis not present

## 2024-08-13 DIAGNOSIS — W19XXXA Unspecified fall, initial encounter: Secondary | ICD-10-CM

## 2024-08-13 DIAGNOSIS — R531 Weakness: Secondary | ICD-10-CM

## 2024-08-13 DIAGNOSIS — Z7901 Long term (current) use of anticoagulants: Secondary | ICD-10-CM | POA: Diagnosis not present

## 2024-08-13 LAB — COMPREHENSIVE METABOLIC PANEL WITH GFR
ALT: 7 U/L (ref 0–44)
AST: 26 U/L (ref 15–41)
Albumin: 4.3 g/dL (ref 3.5–5.0)
Alkaline Phosphatase: 80 U/L (ref 38–126)
Anion gap: 15 (ref 5–15)
BUN: 48 mg/dL — ABNORMAL HIGH (ref 8–23)
CO2: 28 mmol/L (ref 22–32)
Calcium: 8.5 mg/dL — ABNORMAL LOW (ref 8.9–10.3)
Chloride: 97 mmol/L — ABNORMAL LOW (ref 98–111)
Creatinine, Ser: 7.89 mg/dL — ABNORMAL HIGH (ref 0.61–1.24)
GFR, Estimated: 7 mL/min — ABNORMAL LOW (ref >60)
Glucose, Bld: 120 mg/dL — ABNORMAL HIGH (ref 70–99)
Potassium: 4.5 mmol/L (ref 3.5–5.1)
Sodium: 140 mmol/L (ref 135–145)
Total Bilirubin: 0.5 mg/dL (ref 0.0–1.2)
Total Protein: 9.3 g/dL — ABNORMAL HIGH (ref 6.5–8.1)

## 2024-08-13 LAB — CBC WITH DIFFERENTIAL/PLATELET
Abs Immature Granulocytes: 0.01 K/uL (ref 0.00–0.07)
Basophils Absolute: 0 K/uL (ref 0.0–0.1)
Basophils Relative: 1 %
Eosinophils Absolute: 0.3 K/uL (ref 0.0–0.5)
Eosinophils Relative: 5 %
HCT: 31.7 % — ABNORMAL LOW (ref 39.0–52.0)
Hemoglobin: 9.7 g/dL — ABNORMAL LOW (ref 13.0–17.0)
Immature Granulocytes: 0 %
Lymphocytes Relative: 23 %
Lymphs Abs: 1.2 K/uL (ref 0.7–4.0)
MCH: 32.3 pg (ref 26.0–34.0)
MCHC: 30.6 g/dL (ref 30.0–36.0)
MCV: 105.7 fL — ABNORMAL HIGH (ref 80.0–100.0)
Monocytes Absolute: 0.4 K/uL (ref 0.1–1.0)
Monocytes Relative: 8 %
Neutro Abs: 3.2 K/uL (ref 1.7–7.7)
Neutrophils Relative %: 63 %
Platelets: 184 K/uL (ref 150–400)
RBC: 3 MIL/uL — ABNORMAL LOW (ref 4.22–5.81)
RDW: 16.1 % — ABNORMAL HIGH (ref 11.5–15.5)
WBC: 5.1 K/uL (ref 4.0–10.5)
nRBC: 0 % (ref 0.0–0.2)

## 2024-08-13 LAB — CBG MONITORING, ED: Glucose-Capillary: 128 mg/dL — ABNORMAL HIGH (ref 70–99)

## 2024-08-13 LAB — MAGNESIUM: Magnesium: 2.4 mg/dL (ref 1.7–2.4)

## 2024-08-13 MED ORDER — VITAMIN D 25 MCG (1000 UNIT) PO TABS
5000.0000 [IU] | ORAL_TABLET | Freq: Every day | ORAL | Status: DC
  Administered 2024-08-13 – 2024-08-24 (×10): 5000 [IU] via ORAL
  Filled 2024-08-13 (×16): qty 5

## 2024-08-13 MED ORDER — LACTULOSE 10 GM/15ML PO SOLN
30.0000 g | Freq: Every day | ORAL | Status: DC
  Administered 2024-08-13 – 2024-08-24 (×11): 30 g via ORAL
  Filled 2024-08-13: qty 45
  Filled 2024-08-13 (×3): qty 60
  Filled 2024-08-13: qty 45
  Filled 2024-08-13 (×7): qty 60

## 2024-08-13 MED ORDER — ATORVASTATIN CALCIUM 80 MG PO TABS
80.0000 mg | ORAL_TABLET | Freq: Every day | ORAL | Status: DC
  Administered 2024-08-13 – 2024-08-23 (×11): 80 mg via ORAL
  Filled 2024-08-13: qty 1
  Filled 2024-08-13 (×2): qty 2
  Filled 2024-08-13: qty 1
  Filled 2024-08-13 (×2): qty 2
  Filled 2024-08-13 (×5): qty 1

## 2024-08-13 MED ORDER — VITAMIN B-12 1000 MCG PO TABS
1000.0000 ug | ORAL_TABLET | Freq: Every day | ORAL | Status: DC
  Administered 2024-08-13 – 2024-08-24 (×11): 1000 ug via ORAL
  Filled 2024-08-13 (×12): qty 1

## 2024-08-13 MED ORDER — TAMSULOSIN HCL 0.4 MG PO CAPS
0.4000 mg | ORAL_CAPSULE | Freq: Every day | ORAL | Status: DC
  Administered 2024-08-13 – 2024-08-24 (×11): 0.4 mg via ORAL
  Filled 2024-08-13 (×12): qty 1

## 2024-08-13 MED ORDER — MIDODRINE HCL 5 MG PO TABS
5.0000 mg | ORAL_TABLET | Freq: Three times a day (TID) | ORAL | Status: DC
  Administered 2024-08-13: 5 mg via ORAL
  Filled 2024-08-13 (×2): qty 1

## 2024-08-13 MED ORDER — OXYCODONE HCL ER 20 MG PO T12A
20.0000 mg | EXTENDED_RELEASE_TABLET | Freq: Two times a day (BID) | ORAL | Status: DC
Start: 2024-08-13 — End: 2024-08-18
  Administered 2024-08-13 – 2024-08-17 (×7): 20 mg via ORAL
  Filled 2024-08-13 (×4): qty 1
  Filled 2024-08-13: qty 2
  Filled 2024-08-13 (×2): qty 1
  Filled 2024-08-13: qty 2

## 2024-08-13 MED ORDER — BISACODYL 10 MG RE SUPP: 10.0000 mg | Freq: Every day | RECTAL | Status: DC | PRN

## 2024-08-13 MED ORDER — METHOCARBAMOL 500 MG PO TABS
500.0000 mg | ORAL_TABLET | Freq: Four times a day (QID) | ORAL | Status: DC | PRN
  Administered 2024-08-13 – 2024-08-21 (×6): 500 mg via ORAL
  Filled 2024-08-13 (×6): qty 1

## 2024-08-13 MED ORDER — APIXABAN 5 MG PO TABS
5.0000 mg | ORAL_TABLET | Freq: Two times a day (BID) | ORAL | Status: DC
Start: 2024-08-13 — End: 2024-08-24
  Administered 2024-08-13 – 2024-08-24 (×21): 5 mg via ORAL
  Filled 2024-08-13 (×8): qty 1
  Filled 2024-08-13: qty 2
  Filled 2024-08-13 (×8): qty 1
  Filled 2024-08-13: qty 2
  Filled 2024-08-13 (×4): qty 1

## 2024-08-13 MED ORDER — LEVOTHYROXINE SODIUM 50 MCG PO TABS
50.0000 ug | ORAL_TABLET | Freq: Every day | ORAL | Status: DC
Start: 2024-08-14 — End: 2024-08-24
  Administered 2024-08-14 – 2024-08-24 (×10): 50 ug via ORAL
  Filled 2024-08-13 (×8): qty 1
  Filled 2024-08-13: qty 2
  Filled 2024-08-13 (×2): qty 1

## 2024-08-13 NOTE — ED Notes (Signed)
 Pt states he has not taken any of his medications in over a week due to not having a PCP. States he was supposed to go to dialysis today but didn't make it because he couldn't walk. Pt states he does not monitor his sugar.

## 2024-08-13 NOTE — Progress Notes (Signed)
 PT Cancellation Note  Patient Details Name: Thomas [name] MRN: [id number] DOB: January 10, 1951   Cancelled Treatment:    Reason Eval/Treat Not Completed: Other (comment); pt remains hypertensive at 174/103;  Per previous PT Notes (most recent 06/2024) at [facility] pt was w/c dependent and stayed mostly in bed after his 8 month stent in rehab and hospitals.  So it appears he has not been ambulating since prior to February 2025 according to past documentation. If pt should d/c today it appears he functions at w/c level at baseline.   PT ordered and full eval to follow next day or as schedule allows.    [facility] 08/13/2024, 5:10 PM

## 2024-08-13 NOTE — ED Provider Notes (Signed)
 [place] EMERGENCY DEPARTMENT AT [facility] Provider Note   CSN: [id number] Arrival date & time: 08/13/24  1305     Patient presents with: Back Pain   Thomas [name] is a 73 y.o. male.   HPI Patient with multiple medical problems presents with weakness, concern for falls.  He is here with his brother who assists with the history. Patient's history is notable for dialysis, which he last had 2 days ago.  Today he was unable to go secondary to diffuse weakness.  Weakness was profound, he was unable to leave home, and did not fall. He notes that he was similarly weak yesterday, went to a different emergency department was evaluated, sent home.  Today he states that he is persistently weak, without focality. Patient notes that in February of this year he was hospitalized due to foot infection.  He essentially spent the next 8 months in hospitals, rehab facilities, was discharged 3 weeks ago home. During that interval patient's position has left, and he has no physician, nor any medications.    Prior to Admission medications   Medication Sig Start Date End Date Taking? Authorizing Provider  apixaban  (ELIQUIS ) 5 MG TABS tablet Take 1 tablet (5 mg total) by mouth 2 (two) times daily. 03/15/24   Samtani, Jai-Gurmukh, MD  atorvastatin  (LIPITOR ) 80 MG tablet Take 80 mg by mouth at bedtime.    [provider]  bisacodyl  (DULCOLAX) 10 MG suppository Place 1 suppository (10 mg total) rectally daily as needed for moderate constipation. 03/15/24   Samtani, Jai-Gurmukh, MD  Cholecalciferol  125 MCG (5000 UT) capsule Take 5,000 Units by mouth daily.    [provider]  cyanocobalamin  (VITAMIN B12) 1000 MCG tablet Take 1 tablet by mouth daily.    [provider]  HYDROmorphone  (DILAUDID ) 4 MG tablet Take 4 mg by mouth See admin instructions. Give one tablet by mouth in the morning on Monday, Wednesdays and Fridays dialysis days only    [provider]   lactulose  (CHRONULAC ) 10 GM/15ML solution Take 45 mLs (30 g total) by mouth daily. 03/15/24   Samtani, Jai-Gurmukh, MD  levothyroxine  (SYNTHROID ) 50 MCG tablet Take 50 mcg by mouth daily before breakfast.     [provider]  midodrine  (PROAMATINE ) 5 MG tablet Take 1 tablet (5 mg total) by mouth 3 (three) times daily with meals. 03/15/24   Samtani, Jai-Gurmukh, MD  Nutritional Supplements (NEPRO PO) Take 240 mLs by mouth 2 (two) times daily.    [provider]  ondansetron  (ZOFRAN ) 4 MG tablet Take 4 mg by mouth every 4 (four) hours as needed for nausea or vomiting.    [provider]  oxyCODONE  (OXYCONTIN ) 20 mg 12 hr tablet Take 20 mg by mouth every 12 (twelve) hours.    [provider]  senna-docusate (SENOKOT-S) 8.6-50 MG tablet Take 4 tablets by mouth 2 (two) times daily. 11/04/22   [provider]  sevelamer  (RENAGEL ) 800 MG tablet Take 1,600 mg by mouth 3 (three) times daily. 08/27/23   [provider]  sodium zirconium cyclosilicate  (LOKELMA ) 10 g PACK packet Take 10 g by mouth 2 (two) times daily. 03/30/24   Armenta Canning, MD  tamsulosin  (FLOMAX ) 0.4 MG CAPS capsule Take 1 capsule (0.4 mg total) by mouth daily. Patient taking differently: Take 0.4 mg by mouth every evening. 09/25/20   Arnett Saunders, MD  tiZANidine  (ZANAFLEX ) 4 MG tablet Take 4 mg by mouth every 8 (eight) hours as needed for muscle spasms. 11/26/21  [provider]  Vitamin E 670 MG (1000 UT) CAPS Take 1,000 mg by mouth daily.    [provider]    Allergies: Other, Oxymorphone, Amitriptyline, Aspirin, Beta vulgaris, Buspirone, Cabbage, Codeine, Cyclobenzaprine, Diflunisal, Fish allergy, Fish protein-containing drug products, Hydroxyzine, Levetiracetam, Methadone, Nalbuphine, Pentazocine, Propoxyphene, Shellfish allergy, Sulfa antibiotics, Sulfasalazine, and Amoxicillin     Review of Systems  Updated Vital Signs BP (!) 174/103 (BP Location: Right Arm)    Pulse 72   Temp 98.4 F (36.9 C) (Oral)   Resp 16   SpO2 99%   Physical Exam Vitals and nursing note reviewed.  Constitutional:      General: He is not in acute distress.    Appearance: He is well-developed.  HENT:     Head: Normocephalic and atraumatic.  Eyes:     Conjunctiva/sclera: Conjunctivae normal.  Cardiovascular:     Rate and Rhythm: Normal rate and regular rhythm.  Pulmonary:     Effort: Pulmonary effort is normal. No respiratory distress.     Breath sounds: No stridor.  Abdominal:     General: There is no distension.  Musculoskeletal:       Arms:  Skin:    General: Skin is warm and dry.  Neurological:     Mental Status: He is alert and oriented to person, place, and time.     (all labs ordered are listed, but only abnormal results are displayed) Labs Reviewed  CBC WITH DIFFERENTIAL/PLATELET - Abnormal; Notable for the following components:      Result Value   RBC 3.00 (*)    Hemoglobin 9.7 (*)    HCT 31.7 (*)    MCV 105.7 (*)    RDW 16.1 (*)    All other components within normal limits  COMPREHENSIVE METABOLIC PANEL WITH GFR - Abnormal; Notable for the following components:   Chloride 97 (*)    Glucose, Bld 120 (*)    BUN 48 (*)    Creatinine, Ser 7.89 (*)    Calcium  8.5 (*)    Total Protein 9.3 (*)    GFR, Estimated 7 (*)    All other components within normal limits  CBG MONITORING, ED - Abnormal; Notable for the following components:   Glucose-Capillary 128 (*)    All other components within normal limits  MAGNESIUM     EKG: EKG Interpretation Date/Time:  Saturday August 13 2024 15:24:02 EDT Ventricular Rate:  67 PR Interval:  205 QRS Duration:  102 QT Interval:  475 QTC Calculation: 502 R Axis:   -8  Text Interpretation: Sinus rhythm No significant change since last tracing Prolonged QT interval Confirmed by Garrick Charleston [phone]) on 08/13/2024 4:24:11 PM  Radiology: No results found.   Procedures   Medications Ordered in the  ED  apixaban  (ELIQUIS ) tablet 5 mg (has no administration in time range)  atorvastatin  (LIPITOR ) tablet 80 mg (has no administration in time range)  bisacodyl  (DULCOLAX) suppository 10 mg (has no administration in time range)  Cholecalciferol  5,000 Units (has no administration in time range)  cyanocobalamin  (VITAMIN B12) tablet 1,000 mcg (has no administration in time range)  lactulose  (CHRONULAC ) 10 GM/15ML solution 30 g (has no administration in time range)  levothyroxine  (SYNTHROID ) tablet 50 mcg (has no administration in time range)  midodrine  (PROAMATINE ) tablet 5 mg (has no administration in time range)  tamsulosin  (FLOMAX ) capsule 0.4 mg (has no administration in time range)  Medical Decision Making Elderly male end-stage renal disease long-term hospitalization/skilled nursing facility over the year now with weakness, reported fall.  Patient is awake, alert, hemodynamically unremarkable aside from hypertension.  He has no focal weakness, but with description of weakness, fatigue, concern for consequences of renal disease, including arrhythmia, placed on monitor, ECG ordered, labs ordered. Cardiac 80 sinus normal pulse ox 99% room air normal  Amount and/or Complexity of Data Reviewed Independent Historian:     Details: Brother at bedside External Data Reviewed: notes.    Details: In addition to the patient's ED visit last night he was seen 3 days ago at another facility, notes below Labs: ordered. Decision-making details documented in ED Course. ECG/medicine tests: ordered and independent interpretation performed. Decision-making details documented in ED Course.  Risk OTC drugs. Prescription drug management. Decision regarding hospitalization. Diagnosis or treatment significantly limited by social determinants of health.    HPI from emergency department visit 3 days ago in [facility]: HPI Pt is a 73 y.o. male with PMHx of HTN, CHF, DM, PE  who presents via EMS with complaint of generalized fatigue. He reports he missed dialysis yesterday morning. He states he ran out of his medications. He denies any chest pain, shortness of breath, recent fevers, chills, cough or cold symptoms. He has dialysis on Tuesday, Thursday, Saturday ~ last dialyzed Saturday. [facility], [place]).   4:26 PM Patient remains mildly hypertensive otherwise hemodynamically stable, awake, alert, appropriately interactive. And reviewed his labs, findings generally unremarkable for patient with end-stage renal disease.  I have reviewed the patient's chart including documentation from prior ED evaluation, and discussed all findings with patient and his brother. Patient continues to note that he cannot walk, he does not capable of going home.  Given his medical profile including amputation of parts of both legs, home meds ordered, social work consult placed.     Final diagnoses:  Weakness  Fall, initial encounter     Garrick Charleston, MD 08/13/24 [phone]

## 2024-08-13 NOTE — Care Management (Signed)
 Transition of Care [facility]) - Emergency Department Mini Assessment   Patient Details  Name: Thomas [name] MRN: [id number] Date of Birth: 1951/05/31  Transition of Care [facility]) CM/SW Contact:    Corean JAYSON Canary, RN Phone Number: 08/13/2024, 4:32 PM   Clinical Narrative: Patient presents here stating he cannot take care of himself. He missed dialysis today due to fall, he is not taking his blood sugars at home, talking about needing refillls, however he presented to [facility] on October 22 and received refills for medications. He declined a PCP at that time.  Also recommend that palliative be consulted for goals of care.   ED Mini Assessment:  PT assessment pending         Means of departure: Ambulance  Interventions which prevented an admission or readmission:  (PT consult)    Patient Contact and Communications        ,                 Admission diagnosis:  Back Pain Patient Active Problem List   Diagnosis Date Noted   Malnutrition of moderate degree 03/12/2024   Subacute osteomyelitis of left foot (HCC) 02/15/2024   Acute low back pain 02/09/2024   Fall 02/09/2024   End stage renal disease on dialysis (HCC) 02/09/2024   History of anemia due to chronic kidney disease 02/09/2024   Acute midline thoracic back pain 02/09/2024   Osteomyelitis of thoracic spine (HCC) 02/08/2024   Gabapentin -induced toxicity 01/14/2022   Open wound of left heel 01/14/2022   Acute renal failure superimposed on stage 4 chronic kidney disease (HCC) 01/13/2022   Chest pain 01/13/2022   UTI (urinary tract infection) 12/31/2021   Chronic anticoagulation 12/31/2021   Prolonged QT interval 12/31/2021   Multifocal pneumonia 12/30/2021   SVT (supraventricular tachycardia) 12/11/2021   Obesity, Class I, BMI 30-34.9 12/10/2021   Left leg cellulitis 12/07/2021   Hyperkalemia 10/09/2021   Acquired hypothyroidism 10/09/2021   Hydronephrosis of right kidney 10/09/2021   Hypoglycemia  10/07/2021   Deep vein thrombosis (DVT) of left lower extremity (HCC) 10/07/2021   Acute renal failure superimposed on chronic kidney disease 09/17/2021   CAP (community acquired pneumonia) 09/17/2021   Cellulitis of left lower extremity    Pressure injury of skin 06/03/2021   AKI (acute kidney injury) 06/02/2021   Pain management    Cord compression (HCC)    Drug-induced constipation    Acute midline low back pain with sciatica    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) 08/30/2020   Urinary retention 12/04/2019   BPH (benign prostatic hyperplasia) 11/24/2019   CKD (chronic kidney disease), stage IV (HCC) 11/24/2019   Chronic pain syndrome 11/24/2019   Physical deconditioning 11/24/2019   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   Pulmonary emboli (HCC) 09/29/2018   [name], 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/2018   DM2 (diabetes mellitus, type 2) (HCC) 09/29/2018   Gout 09/29/2018   Essential hypertension 09/29/2018   Chronic combined systolic and diastolic congestive heart failure (HCC) 09/29/2018   GERD (gastroesophageal reflux disease) 09/29/2018   PCP:  Patient, No Pcp Per Pharmacy:   [address] [place], [name] - [address] [place] [address] Phone: [phone] Fax: [phone]

## 2024-08-13 NOTE — ED Triage Notes (Signed)
 PT reports back pain, weakness, and unable to care for himself at home. Reports he fell this morning and was unable to go to dialysis.

## 2024-08-14 LAB — BASIC METABOLIC PANEL WITH GFR
Anion gap: 15 (ref 5–15)
BUN: 52 mg/dL — ABNORMAL HIGH (ref 8–23)
CO2: 25 mmol/L (ref 22–32)
Calcium: 8.3 mg/dL — ABNORMAL LOW (ref 8.9–10.3)
Chloride: 96 mmol/L — ABNORMAL LOW (ref 98–111)
Creatinine, Ser: 8.59 mg/dL — ABNORMAL HIGH (ref 0.61–1.24)
GFR, Estimated: 6 mL/min — ABNORMAL LOW (ref >60)
Glucose, Bld: 91 mg/dL (ref 70–99)
Potassium: 5 mmol/L (ref 3.5–5.1)
Sodium: 136 mmol/L (ref 135–145)

## 2024-08-14 MED ORDER — ONDANSETRON 4 MG PO TBDP
4.0000 mg | ORAL_TABLET | Freq: Three times a day (TID) | ORAL | Status: DC | PRN
  Administered 2024-08-14 – 2024-08-23 (×2): 4 mg via ORAL
  Filled 2024-08-14 (×2): qty 1

## 2024-08-14 NOTE — ED Notes (Signed)
 Patient belongings are left in room with patient d/t patient being transferred to [facility] tomorrow for dialysis.

## 2024-08-14 NOTE — ED Notes (Signed)
 Discussed patient hypertension with provider and appositeness for  physical therapy with with Francesca, MD. MD states that patient's BP is elevated due to missed hemodialysis treatments, which will be resumed tomorrow. MD further states that that since patient's mentation at baseline despite blood pressure.

## 2024-08-14 NOTE — Evaluation (Signed)
 Physical Therapy Evaluation Patient Details Name: Thomas [name] MRN: [id number] DOB: 24-Nov-1950 Today's Date: 08/14/2024  History of Present Illness  73 y.o. male came to ED with  complaints of Back Pain, weakness, missed HD.  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  Pt's true baseline is unlcear, pt gives varying reports of PLOF.  Pt is supervision for bed mobility, +2 assist for transfers and amb short in room distance.  Patient will benefit from continued inpatient follow up therapy, <3 hours/day, ultimately needs LTC. Pt has been in and out of hospitals and facilities for last 8 mos.   Needs prosthetist consult to assess fit of  RLE prosthesis, appears too big, causing significant gait instability (unsure how old the prosthesis actually is ) pt currently has ace wrapped the sleeve at the top to keep it from falling off. Would benefit from w/c (pt states it won't fit through door of  his apt--?)  Will follow in acute setting  Pt currently with functional limitations due to the deficits listed below (see PT Problem List). Pt will benefit from acute skilled PT to increase their independence and safety with mobility to allow discharge.           If plan is discharge home, recommend the following: Two people to help with walking and/or transfers;Two people to help with bathing/dressing/bathroom;Help with stairs or ramp for entrance;Assist for transportation;Assistance with cooking/housework   Can travel by private vehicle   No    Equipment Recommendations Other (comment) (w/c-?)  Recommendations for Other Services       Functional Status Assessment Patient has had a recent decline in their functional status and demonstrates the ability to make significant improvements in function in a reasonable and predictable amount of time.      Precautions / Restrictions Precautions Precautions: Fall Precaution/Restrictions Comments: L TMA, R BKA Restrictions Weight Bearing Restrictions Per Provider Order: No      Mobility  Bed Mobility Overal bed mobility: Needs Assistance Bed Mobility: Supine to Sit, Sit to Supine     Supine to sit: Supervision Sit to supine: Supervision   General bed mobility comments: no physical assist, incr  time needed    Transfers Overall transfer level: Needs assistance Equipment used: Rolling walker (2 wheels) Transfers: Sit to/from Stand Sit to Stand: Mod assist, +2 safety/equipment, From elevated surface           General transfer comment: cues for hand placement and safety    Ambulation/Gait Ambulation/Gait assistance: Mod assist, +2 physical assistance, +2 safety/equipment Gait Distance (Feet): 12 Feet Assistive device: Rolling walker (2 wheels) Gait Pattern/deviations: Decreased step length - right, Decreased step length - left, Knee flexed in stance - left, Knee flexed in stance - right, Trunk flexed Gait velocity: decr     General Gait Details: very unsteady gait. RLE  prosthesis not fitting well causing significant RLE instability when wt shifting, L TMA and decr base of sort also contributing to gait instability. pt tends to lean forearms on RW;  Stairs            Wheelchair Mobility     Tilt Bed    Modified Rankin (Stroke Patients Only)       Balance Overall balance assessment: Needs assistance Sitting-balance support: No upper extremity supported, Feet supported Sitting balance-[name]: Good Sitting balance - Comments: pt  is able to wt shift, don L sock I'ly   Standing balance support: Reliant on assistive device for balance, During functional activity, Bilateral upper extremity supported Standing balance-[name]: Poor Standing balance comment: reliant on RW and suipport for static stand                              Pertinent Vitals/Pain Pain Assessment Pain Assessment: Faces Faces Pain Scale: Hurts even more Pain Location: back and R shoulder Pain Descriptors / Indicators: Grimacing, Sharp Pain Intervention(s): Limited activity within patient's tolerance, Monitored during session, Patient requesting pain meds-RN notified    Home Living Family/patient expects to be discharged to:: Private residence Living Arrangements: Alone   Type of Home: Apartment Home Access: Stairs to enter   [employer] of Steps: 1   Home Layout: One level Home Equipment: [employer] (2 wheels);Crutches Additional Comments: Pt reports he sleeps in a wingback chair and props LEs on stool    Prior Function               Mobility Comments: Pt amb household distances with rolling walker and prosthetic; reports w/c does not fit through door  (pt gives varying stories to different staff members this admission, info regarding mobility differs from prior PT notes at other systems)       Extremity/Trunk Assessment   Upper Extremity Assessment Upper Extremity Assessment: Defer to OT evaluation    Lower Extremity Assessment Lower Extremity Assessment: Generalized weakness;RLE deficits/detail;LLE deficits/detail RLE Deficits / Details: prosthetic in place; able to lift RLE against gravity, at least 3+/5 LLE Deficits / Details: grossly 4/5, AROM WFL LLE Sensation: decreased light touch (pt states can't feel my leg)       Communication   Communication Communication: No apparent difficulties    Cognition Arousal: Alert Behavior During Therapy: WFL for tasks assessed/performed   PT - Cognitive impairments: Awareness, Safety/Judgement                         Following commands: Intact       Cueing Cueing Techniques: Verbal cues, Gestural cues     General Comments      Exercises     Assessment/Plan    PT Assessment Patient needs continued PT services  PT Problem List  Decreased strength;Decreased mobility;Decreased activity tolerance;Decreased balance;Decreased knowledge of use of DME       PT Treatment Interventions Therapeutic exercise;DME instruction;Gait training;Functional mobility training;Therapeutic activities;Patient/family education    PT Goals (Current goals can be found in the Care Plan section)  Acute Rehab PT Goals Patient Stated Goal: to walk PT Goal Formulation: With patient Time For Goal Achievement: 08/28/24 Potential to Achieve Goals: Fair    Frequency Min 2X/week     Co-evaluation               AM-PAC PT 6 Clicks Mobility  Outcome Measure Help needed turning from your back to your side while in a flat bed without using bedrails?: None Help needed moving from lying on your back to sitting on the side of a flat bed without using bedrails?: None Help needed moving to and from a bed to a chair (including a wheelchair)?: Total Help needed standing up from a chair using your arms (e.g., wheelchair or bedside chair)?: A Lot Help needed to walk in hospital room?: Total Help needed climbing 3-5 steps with a railing? : Total 6 Click Score: 13  End of Session Equipment Utilized During Treatment: Gait belt Activity Tolerance: Patient tolerated treatment well;Patient limited by pain Patient left: in bed;with call bell/phone within reach;with bed alarm set Nurse Communication: Patient requests pain meds;Mobility status PT Visit Diagnosis: Other abnormalities of gait and mobility (R26.89);Muscle weakness (generalized) (M62.81);History of falling (Z91.81)    Time: 8661-8644 PT Time Calculation (min) (ACUTE ONLY): 17 min   Charges:   PT Evaluation $PT Eval Low Complexity: 1 Low   PT General Charges $$ ACUTE PT VISIT: 1 Visit         Latayna Ritchie, PT  Acute Rehab Dept [facility]) [phone]  08/14/2024   [facility] 08/14/2024, 2:56 PM
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
## 2024-08-14 NOTE — Progress Notes (Signed)
 Paged by Dr. Francesca. Boarding at [facility], pending SNF due to weakness/back pain. He is ESRD on TTS sched. OP [place]. Did not have HD yesterday. No urgent indications for HD now. Will be seen tomorrow and will see if he can wait until Tues for his regularly scheduled HD. Call on call nephrologist with any q's/concerns.  Ephriam Stank, MD [facility]

## 2024-08-14 NOTE — ED Notes (Signed)
 Spoke with EDP regarding patient transfer to [facility] ED. States plan is to wait to transfer tomorrow.

## 2024-08-14 NOTE — ED Provider Notes (Addendum)
 Emergency Medicine Observation Re-evaluation Note  Thomas [name] is a 73 y.o. male, seen on rounds today.  Pt initially presented to the ED for complaints of Back Pain Currently, the patient is sleeping.  Physical Exam  BP (!) 193/107 (BP Location: Right Arm)   Pulse 73   Temp 98.4 F (36.9 C) (Oral)   Resp 16   SpO2 99%  Physical Exam General: NAD Cardiac: regular rate  Lungs: equal chest rise Psych: calm  ED Course / MDM  EKG:EKG Interpretation Date/Time:  Saturday August 13 2024 15:24:02 EDT Ventricular Rate:  67 PR Interval:  205 QRS Duration:  102 QT Interval:  475 QTC Calculation: 502 R Axis:   -8  Text Interpretation: Sinus rhythm No significant change since last tracing Prolonged QT interval Confirmed by Garrick Charleston [phone]) on 08/13/2024 4:24:11 PM  I have reviewed the labs performed to date as well as medications administered while in observation.  Recent changes in the last 24 hours include arrived in ER, placed in TOC boarder status. PT did not evaluate patient.  Plan  Current plan is for PT evaluation for TOC placement.    Francesca Elsie CROME, MD 08/14/24 [phone]  Held midodrine  given htn.     Francesca Elsie CROME, MD 08/14/24 [phone]

## 2024-08-15 LAB — HEPATITIS B SURFACE ANTIGEN: Hepatitis B Surface Ag: NONREACTIVE

## 2024-08-15 MED ORDER — CLONIDINE HCL 0.1 MG PO TABS: 0.1000 mg | ORAL_TABLET | Freq: Three times a day (TID) | ORAL | Status: DC

## 2024-08-15 MED ORDER — OXYCODONE HCL 5 MG PO TABS
5.0000 mg | ORAL_TABLET | ORAL | Status: DC | PRN
  Administered 2024-08-15 – 2024-08-23 (×10): 5 mg via ORAL
  Filled 2024-08-15 (×10): qty 1

## 2024-08-15 MED ORDER — CLONIDINE HCL 0.1 MG PO TABS
0.1000 mg | ORAL_TABLET | Freq: Three times a day (TID) | ORAL | Status: DC
  Administered 2024-08-15 – 2024-08-20 (×11): 0.1 mg via ORAL
  Filled 2024-08-15 (×12): qty 1

## 2024-08-15 MED ORDER — ACETAMINOPHEN 325 MG PO TABS
650.0000 mg | ORAL_TABLET | ORAL | Status: DC | PRN
  Administered 2024-08-15 – 2024-08-21 (×3): 650 mg via ORAL
  Filled 2024-08-15 (×3): qty 2

## 2024-08-15 MED ORDER — CHLORHEXIDINE GLUCONATE CLOTH 2 % EX PADS
6.0000 | MEDICATED_PAD | Freq: Every day | CUTANEOUS | Status: DC
Start: 2024-08-16 — End: 2024-08-22
  Administered 2024-08-17: 6 via TOPICAL

## 2024-08-15 MED ORDER — METOPROLOL SUCCINATE ER 100 MG PO TB24
100.0000 mg | ORAL_TABLET | Freq: Every day | ORAL | Status: DC
  Administered 2024-08-15 – 2024-08-16 (×2): 100 mg via ORAL
  Filled 2024-08-15: qty 2
  Filled 2024-08-15 (×2): qty 1

## 2024-08-15 NOTE — ED Provider Notes (Signed)
 Emergency Medicine Observation Re-evaluation Note  Thomas [name] is a 73 y.o. male, seen on rounds today.  Pt initially presented to the ED for complaints of Back Pain Currently, the patient is resting.  Physical Exam  BP (!) 168/114 (BP Location: Right Arm)   Pulse (!) 53   Temp 98 F (36.7 C) (Oral)   Resp 18   SpO2 100%  Physical Exam General: NAD   ED Course / MDM  EKG:EKG Interpretation Date/Time:  Saturday August 13 2024 15:24:02 EDT Ventricular Rate:  67 PR Interval:  205 QRS Duration:  102 QT Interval:  475 QTC Calculation: 502 R Axis:   -8  Text Interpretation: Sinus rhythm No significant change since last tracing Prolonged QT interval Confirmed by Garrick Charleston [phone]) on 08/13/2024 4:24:11 PM  I have reviewed the labs performed to date as well as medications administered while in observation.  Recent changes in the last 24 hours include no acute events reported.  Plan  Current plan is for placement.    Laurice Maude BROCKS, MD 08/15/24 [phone]

## 2024-08-15 NOTE — Progress Notes (Signed)
 Pt was faxed out for SNF. Bed offers pending. Pt does not currently have bed offers but has several considerations pending bed availability.   Per chart review, pt on HD (TTS) and plan is for transfer to [facility] to stay on schedule.

## 2024-08-15 NOTE — Progress Notes (Signed)
 Pt currently receives out-pt HD at [facility] on TTS 11:00 am chair time. Per clinic staff, pt has been in and out of snf and has had to receive HD at other HD clinics when at snf. Will assist as needed.   Randine Mungo Dialysis Navigator [phone]

## 2024-08-15 NOTE — [place] FL2 (Signed)
 [place]  MEDICAID FL2 LEVEL OF CARE FORM     IDENTIFICATION  Patient Name: Thomas [name] Birthdate: 03-Dec-1950 Sex: male Admission Date (Current Location): 08/13/2024  [facility] and [place] Number:  Producer, Television/film/video and Address:  [facility],  [address], [place] [address]      Provider Number: [id number]  Attending Physician Name and Address:  Laurice Maude BROCKS, MD  Relative Name and Phone Number:  [name], Torbert [facility])  [phone] [facility] Phone)    Current Level of Care: Hospital Recommended Level of Care: Skilled Nursing Facility Prior Approval Number:    Date Approved/Denied:   PASRR Number: [id number]  Discharge Plan: SNF    Current Diagnoses: Patient Active Problem List   Diagnosis Date Noted   Malnutrition of moderate degree 03/12/2024   Subacute osteomyelitis of left foot (HCC) 02/15/2024   Acute low back pain 02/09/2024   Fall 02/09/2024   End stage renal disease on dialysis [facility]) 02/09/2024   History of anemia due to chronic kidney disease 02/09/2024   Acute midline thoracic back pain 02/09/2024   Osteomyelitis of thoracic spine (HCC) 02/08/2024   Gabapentin -induced toxicity 01/14/2022   Open wound of left heel 01/14/2022   Acute renal failure superimposed on stage 4 chronic kidney disease (HCC) 01/13/2022   Chest pain 01/13/2022   UTI (urinary tract infection) 12/31/2021   Chronic anticoagulation 12/31/2021   Prolonged QT interval 12/31/2021   Multifocal pneumonia 12/30/2021   SVT (supraventricular tachycardia) 12/11/2021   Obesity, Class I, BMI 30-34.9 12/10/2021   Left leg cellulitis 12/07/2021   Hyperkalemia 10/09/2021   Acquired hypothyroidism 10/09/2021   Hydronephrosis of right kidney 10/09/2021   Hypoglycemia 10/07/2021   Deep vein thrombosis (DVT) of left lower extremity (HCC) 10/07/2021   Acute renal failure superimposed on chronic kidney disease 09/17/2021   CAP (community acquired pneumonia) 09/17/2021    Cellulitis of left lower extremity    Pressure injury of skin 06/03/2021   AKI (acute kidney injury) 06/02/2021   Pain management    Cord compression (HCC)    Drug-induced constipation    Acute midline low back pain with sciatica    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) 08/30/2020   Urinary retention 12/04/2019   BPH (benign prostatic hyperplasia) 11/24/2019   CKD (chronic kidney disease), stage IV (HCC) 11/24/2019   Chronic pain syndrome 11/24/2019   Physical deconditioning 11/24/2019   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   Pulmonary emboli (HCC) 09/29/2018   [name], 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/2018   DM2 (diabetes mellitus, type 2) (HCC) 09/29/2018   Gout 09/29/2018   Essential hypertension 09/29/2018   Chronic combined systolic and diastolic congestive heart failure (HCC) 09/29/2018   GERD (gastroesophageal reflux disease) 09/29/2018    Orientation RESPIRATION BLADDER Height & Weight     Self, Time, Situation, Place  Normal Continent Weight:   Height:     BEHAVIORAL SYMPTOMS/MOOD NEUROLOGICAL BOWEL NUTRITION STATUS      Continent Diet (Regular)  AMBULATORY STATUS COMMUNICATION OF NEEDS Skin   Extensive Assist Verbally Normal                       Personal Care Assistance Level of Assistance  Bathing, Feeding, Dressing Bathing Assistance: Maximum assistance Feeding assistance: Limited assistance Dressing Assistance: Maximum assistance     Functional Limitations Info  Sight, Hearing, Speech Sight Info: Adequate Hearing Info: Adequate Speech Info: Adequate    SPECIAL CARE FACTORS FREQUENCY   (Patient  has HD on Tues/Thurs/Saturdays)                    Contractures Contractures Info: Not present    Additional Factors Info  Code Status, Allergies Code Status Info: Full Allergies Info: Other  Oxymorphone  Amitriptyline  Aspirin  Beta Vulgaris  Buspirone  Cabbage  Codeine  Cyclobenzaprine  Diflunisal  Fish Allergy  Fish  Protein-containing Drug Products  Hydroxyzine  Levetiracetam  Methadone  Nalbuphine  Pentazocine  Propoxyphene  Shellfish Allergy  Sulfa Antibiotics  Sulfasalazine  Amoxicillin            Current Medications (08/15/2024):  This is the current hospital active medication list Current Facility-Administered Medications  Medication Dose Route Frequency Provider Last Rate Last Admin   acetaminophen  (TYLENOL ) tablet 650 mg  650 mg Oral Q4H PRN Raford Lenis, MD   650 mg at 08/15/24 9371   apixaban  (ELIQUIS ) tablet 5 mg  5 mg Oral BID Garrick Charleston, MD   5 mg at 08/14/24 2152   atorvastatin  (LIPITOR ) tablet 80 mg  80 mg Oral QHS Garrick Charleston, MD   80 mg at 08/14/24 2152   bisacodyl  (DULCOLAX) suppository 10 mg  10 mg Rectal Daily PRN Garrick Charleston, MD       cholecalciferol  (VITAMIN D3) tablet 5,000 Units  5,000 Units Oral Daily Garrick Charleston, MD   5,000 Units at 08/14/24 0915   cyanocobalamin  (VITAMIN B12) tablet 1,000 mcg  1,000 mcg Oral Daily Garrick Charleston, MD   1,000 mcg at 08/14/24 9083   lactulose  (CHRONULAC ) 10 GM/15ML solution 30 g  30 g Oral Daily Garrick Charleston, MD   30 g at 08/14/24 9082   levothyroxine  (SYNTHROID ) tablet 50 mcg  50 mcg Oral QAC breakfast Garrick Charleston, MD   50 mcg at 08/14/24 0914   methocarbamol  (ROBAXIN ) tablet 500 mg  500 mg Oral Q6H PRN Garrick Charleston, MD   500 mg at 08/14/24 1420   ondansetron  (ZOFRAN -ODT) disintegrating tablet 4 mg  4 mg Oral Q8H PRN Francesca Elsie CROME, MD   4 mg at 08/14/24 0948   oxyCODONE  (Oxy IR/ROXICODONE ) immediate release tablet 5 mg  5 mg Oral Q4H PRN Raford Lenis, MD   5 mg at 08/15/24 9371   oxyCODONE  (OXYCONTIN ) 12 hr tablet 20 mg  20 mg Oral Q12H Garrick Charleston, MD   20 mg at 08/14/24 2152   tamsulosin  (FLOMAX ) capsule 0.4 mg  0.4 mg Oral Daily Garrick Charleston, MD   0.4 mg at 08/14/24 9084   Current Outpatient Medications  Medication Sig Dispense Refill   apixaban  (ELIQUIS ) 5 MG TABS tablet Take 1 tablet (5 mg  total) by mouth 2 (two) times daily. 60 tablet 3   bethanechol  (URECHOLINE ) 10 MG tablet Take 10 mg by mouth 2 (two) times daily.     cloNIDine  (CATAPRES ) 0.1 MG tablet Take 0.1 mg by mouth 3 (three) times daily.     lactulose  (CHRONULAC ) 10 GM/15ML solution Take 45 mLs (30 g total) by mouth daily. 236 mL 0   levothyroxine  (SYNTHROID ) 50 MCG tablet Take 50 mcg by mouth daily before breakfast.      methocarbamol  (ROBAXIN ) 500 MG tablet Take 500 mg by mouth every 6 (six) hours as needed for muscle spasms.     metoprolol  succinate (TOPROL -XL) 100 MG 24 hr tablet Take 100 mg by mouth daily. Take with or immediately following a meal.     midodrine  (PROAMATINE ) 5 MG tablet Take 1 tablet (5 mg total) by mouth  3 (three) times daily with meals. 90 tablet 3   omeprazole (PRILOSEC) 20 MG capsule Take 20 mg by mouth 2 (two) times daily before a meal.     REXULTI 1 MG TABS tablet Take 1 mg by mouth daily.     sevelamer  carbonate (RENVELA ) 800 MG tablet Take 2,400 mg by mouth 3 (three) times daily with meals.     sodium zirconium cyclosilicate  (LOKELMA ) 10 g PACK packet Take 10 g by mouth 2 (two) times daily. 6 packet 0   tamsulosin  (FLOMAX ) 0.4 MG CAPS capsule Take 1 capsule (0.4 mg total) by mouth daily. (Patient taking differently: Take 0.4 mg by mouth every evening.) 30 capsule 0   tiZANidine  (ZANAFLEX ) 4 MG tablet Take 4 mg by mouth every 8 (eight) hours as needed for muscle spasms.     atorvastatin  (LIPITOR ) 80 MG tablet Take 80 mg by mouth at bedtime.     bisacodyl  (DULCOLAX) 10 MG suppository Place 1 suppository (10 mg total) rectally daily as needed for moderate constipation. 12 suppository 0   Cholecalciferol  125 MCG (5000 UT) capsule Take 5,000 Units by mouth daily.     cyanocobalamin  (VITAMIN B12) 1000 MCG tablet Take 1 tablet by mouth daily.     HYDROmorphone  (DILAUDID ) 4 MG tablet Take 4 mg by mouth See admin instructions. Give one tablet by mouth in the morning on Monday, Wednesdays and Fridays  dialysis days only     Nutritional Supplements (NEPRO PO) Take 240 mLs by mouth 2 (two) times daily.     ondansetron  (ZOFRAN ) 4 MG tablet Take 4 mg by mouth every 4 (four) hours as needed for nausea or vomiting.     oxyCODONE  (OXYCONTIN ) 20 mg 12 hr tablet Take 20 mg by mouth every 12 (twelve) hours.     senna-docusate (SENOKOT-S) 8.6-50 MG tablet Take 4 tablets by mouth 2 (two) times daily.     sevelamer  (RENAGEL ) 800 MG tablet Take 1,600 mg by mouth 3 (three) times daily.     torsemide (DEMADEX) 20 MG tablet Take 20 mg by mouth 2 (two) times daily.     Vitamin E 670 MG (1000 UT) CAPS Take 1,000 mg by mouth daily.       Discharge Medications: Please see discharge summary for a list of discharge medications.  Relevant Imaging Results:  Relevant Lab Results:   Additional Information SSN  [id number]  Kari JONETTA Daisy, LCSW

## 2024-08-15 NOTE — Consult Note (Signed)
 Renal Service Consult Note [facility] [name], MD  Patient: Thomas [name] Date: 08/15/2024 Requesting Physician: Dr. Laurice  Reason for Consult: ESRD pt needs SNF placement, boarding in ED HPI: The patient is a 73 y.o. year-old w/ PMH as below who presented to ED c/o gen weakness and back pain. Pt missed HD cause he was too weak to get there. Pt was here in Feb for foot infection, then the next 8 mos he was going from hospitals to rehab facilities and was just dc'd 3 wks ago to home. Will need SNF placement. We are asked to see for dialysis.   Pt seen in room. No c/o's today.    ROS - denies CP, no joint pain, no HA, no blurry vision, no rash, no diarrhea, no nausea/ vomiting   Past Medical History  Past Medical History:  Diagnosis Date   Arthritis    BPH (benign prostatic hyperplasia) 11/24/2019   Chronic pain syndrome 11/24/2019   Diabetes mellitus without complication (HCC)    ESRD (end stage renal disease) (HCC)    on HD   Gout    Hypertension    Muscle pain    Physical deconditioning 11/24/2019   Scars    Swelling    Past Surgical History  Past Surgical History:  Procedure Laterality Date   AMPUTATION Left 02/17/2024   Procedure: AMPUTATION, FOOT, PARTIAL;  Surgeon: Harden Jerona GAILS, MD;  Location: [facility] OR;  Service: Orthopedics;  Laterality: Left;  LEFT TRANSMETATARSAL AMPUTATION   BELOW KNEE LEG AMPUTATION Right    ESOPHAGOGASTRODUODENOSCOPY (EGD) WITH PROPOFOL  N/A 11/25/2019   Procedure: ESOPHAGOGASTRODUODENOSCOPY (EGD) WITH PROPOFOL ;  Surgeon: Rollin Dover, MD;  Location: [facility] ENDOSCOPY;  Service: Endoscopy;  Laterality: N/A;   IR FLUORO GUIDE CV LINE RIGHT  09/05/2019   IR LUMBAR DISC ASPIRATION W/IMG GUIDE  11/26/2019   IR US  GUIDE VASC ACCESS RIGHT  09/05/2019   RADIOLOGY WITH ANESTHESIA N/A 11/25/2019   Procedure: Thoracic and Lumbar MRI WITH ANESTHESIA;  Surgeon: Radiologist, Medication, MD;  Location: [facility] OR;  Service: Radiology;   Laterality: N/A;   TUNNELLED CATHETER EXCHANGE N/A 07/22/2024   Procedure: TUNNELLED CATHETER EXCHANGE;  Surgeon: Gretta Lonni PARAS, MD;  Location: [facility];  Service: Cardiovascular;  Laterality: N/A;   Family History  Family History  Problem Relation Age of Onset   Heart disease Mother    Cancer Father    Social History  reports that he has never smoked. He has never been exposed to tobacco smoke. He has never used smokeless tobacco. He reports that he does not drink alcohol and does not use drugs. Allergies  Allergies  Allergen Reactions   Other Other (See Comments)    Blood pressure issues Per [facility] hospital: Pt states he can only take these pain meds or else he gets very sick: Oxycontin ,morphine ,demerol, and dilaudid  are the only pain meds pt states he can take.     Oxymorphone Other (See Comments)    Causes kidney problems   Amitriptyline Other (See Comments)   Aspirin Nausea And Vomiting    Gi upset, has kidney issues   Beta Vulgaris Nausea And Vomiting   Buspirone Other (See Comments)    Affected his head    Cabbage Nausea And Vomiting   Codeine Other (See Comments)    Pt does not remember the reaction but knows he can not take    Cyclobenzaprine Nausea And Vomiting   Diflunisal Nausea And Vomiting and Other (See Comments)  Fish Allergy Nausea And Vomiting   Fish Protein-Containing Drug Products Nausea And Vomiting   Hydroxyzine Nausea And Vomiting   Levetiracetam Other (See Comments)   Methadone Other (See Comments)    Made him loopy   Nalbuphine Other (See Comments)    Cutaneous eruption (morphologic abnormality)   Pentazocine Other (See Comments)    Pt does not remember the reaction    Propoxyphene Other (See Comments)    Pt does not remember the reaction     Shellfish Allergy Nausea And Vomiting   Sulfa Antibiotics Hives   Sulfasalazine Other (See Comments)    Pt does not remember the reaction   Amoxicillin  Nausea And Vomiting and Rash    Per  [facility] medications Prior to Admission medications   Medication Sig Start Date End Date Taking? Authorizing Provider  apixaban  (ELIQUIS ) 5 MG TABS tablet Take 1 tablet (5 mg total) by mouth 2 (two) times daily. 03/15/24  Yes Samtani, Jai-Gurmukh, MD  bethanechol  (URECHOLINE ) 10 MG tablet Take 10 mg by mouth 2 (two) times daily. 08/10/24 09/09/24 Yes [provider]  cloNIDine  (CATAPRES ) 0.1 MG tablet Take 0.1 mg by mouth 3 (three) times daily. 08/10/24 09/09/24 Yes [provider]  lactulose  (CHRONULAC ) 10 GM/15ML solution Take 45 mLs (30 g total) by mouth daily. 03/15/24  Yes Samtani, Jai-Gurmukh, MD  levothyroxine  (SYNTHROID ) 50 MCG tablet Take 50 mcg by mouth daily before breakfast.    Yes [provider]  methocarbamol  (ROBAXIN ) 500 MG tablet Take 500 mg by mouth every 6 (six) hours as needed for muscle spasms. 05/25/24  Yes [provider]  metoprolol  succinate (TOPROL -XL) 100 MG 24 hr tablet Take 100 mg by mouth daily. Take with or immediately following a meal.   Yes [provider]  midodrine  (PROAMATINE ) 5 MG tablet Take 1 tablet (5 mg total) by mouth 3 (three) times daily with meals. 03/15/24  Yes Samtani, Jai-Gurmukh, MD  omeprazole (PRILOSEC) 20 MG capsule Take 20 mg by mouth 2 (two) times daily before a meal. 08/10/24 09/09/24 Yes [provider]  REXULTI 1 MG TABS tablet Take 1 mg by mouth daily. 06/01/24  Yes [provider]  sevelamer  carbonate (RENVELA ) 800 MG tablet Take 2,400 mg by mouth 3 (three) times daily with meals. 05/19/24  Yes [provider]  sodium zirconium cyclosilicate  (LOKELMA ) 10 g PACK packet Take 10 g by mouth 2 (two) times daily. 03/30/24  Yes Armenta Canning, MD  tamsulosin  (FLOMAX ) 0.4 MG CAPS capsule Take 1 capsule (0.4 mg total) by mouth daily. Patient taking differently: Take 0.4 mg by mouth every evening. 09/25/20  Yes Arnett Saunders, MD  tiZANidine  (ZANAFLEX ) 4 MG tablet Take  4 mg by mouth every 8 (eight) hours as needed for muscle spasms. 11/26/21  Yes [provider]  atorvastatin  (LIPITOR ) 80 MG tablet Take 80 mg by mouth at bedtime.    [provider]  bisacodyl  (DULCOLAX) 10 MG suppository Place 1 suppository (10 mg total) rectally daily as needed for moderate constipation. 03/15/24   Samtani, Jai-Gurmukh, MD  Cholecalciferol  125 MCG (5000 UT) capsule Take 5,000 Units by mouth daily.    [provider]  cyanocobalamin  (VITAMIN B12) 1000 MCG tablet Take 1 tablet by mouth daily.    [provider]  HYDROmorphone  (DILAUDID ) 4 MG tablet Take 4 mg by mouth See admin instructions. Give one tablet by mouth in the morning on Monday, Wednesdays and Fridays dialysis days only    [provider]  Nutritional Supplements (NEPRO PO) Take 240 mLs by mouth 2 (two) times daily.    [provider]  ondansetron  (ZOFRAN ) 4 MG tablet Take 4 mg by mouth every 4 (four) hours as needed for nausea or vomiting.    [provider]  oxyCODONE  (OXYCONTIN ) 20 mg 12 hr tablet Take 20 mg by mouth every 12 (twelve) hours.    [provider]  senna-docusate (SENOKOT-S) 8.6-50 MG tablet Take 4 tablets by mouth 2 (two) times daily. 11/04/22   [provider]  sevelamer  (RENAGEL ) 800 MG tablet Take 1,600 mg by mouth 3 (three) times daily. 08/27/23   [provider]  torsemide (DEMADEX) 20 MG tablet Take 20 mg by mouth 2 (two) times daily.    [provider]  Vitamin E 670 MG (1000 UT) CAPS Take 1,000 mg by mouth daily.    [provider]     Vitals:   08/14/24 0910 08/14/24 1555 08/14/24 2124 08/15/24 0623  BP: (!) 187/106 (!) 159/102 (!) 172/108 (!) 168/114  Pulse: 69 82 87 (!) 53  Resp: 15 18 18 18   Temp: (!) 97.2 F (36.2 C) 98.6 F (37 C) 98.6 F (37 C) 98 F (36.7 C)  TempSrc: Oral Oral Oral Oral  SpO2: 100% 98% 100% 100%   Exam Gen alert, no distress Sclera anicteric, throat clear   No jvd or bruits Chest clear bilat to bases RRR no MRG Abd soft ntnd no mass or ascites +bs Ext no LE edema, R BKA, L TMA Neuro is alert, Ox 3 , nf    TDC    OP HD: TTS not sure HD location 3.5h  2K bath  92kg  Heparin  none     Assessment/ Plan: Gen'd weakness: will need SNF placement. Boarding in ED.  ESRD: on HD TTS. Volume and labs are stable, will plan HD tomorrow.  HTN: bp's on the high side. Get vol down w/ HD. Will resume home clonidine  and toprol  xl. Holding home midodrine  (5 tid) for now.  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       Myer Fret  MD [facility] 08/15/2024, 12:34 PM  Recent Labs  Lab 08/13/24 1420 08/14/24 0947  HGB 9.7*  --   ALBUMIN  4.3  --   CALCIUM  8.5* 8.3*  CREATININE 7.89* 8.59*  K 4.5 5.0   Inpatient medications:  apixaban   5 mg Oral BID   atorvastatin   80 mg Oral QHS   cholecalciferol   5,000 Units Oral Daily   cyanocobalamin   1,000 mcg Oral Daily   lactulose   30 g Oral Daily   levothyroxine   50 mcg Oral QAC breakfast   oxyCODONE   20 mg Oral Q12H   tamsulosin   0.4 mg Oral Daily    acetaminophen , bisacodyl , methocarbamol , ondansetron , oxyCODONE 

## 2024-08-16 LAB — HEPATITIS B SURFACE ANTIBODY, QUANTITATIVE: Hep B S AB Quant (Post): <3.5 m[IU]/mL — ABNORMAL LOW

## 2024-08-16 MED ORDER — LIDOCAINE-PRILOCAINE 2.5-2.5 % EX CREA: 1.0000 | TOPICAL_CREAM | CUTANEOUS | Status: DC | PRN

## 2024-08-16 MED ORDER — LIDOCAINE HCL (PF) 1 % IJ SOLN: 5.0000 mL | INTRAMUSCULAR | Status: DC | PRN

## 2024-08-16 MED ORDER — PENTAFLUOROPROP-TETRAFLUOROETH EX AERO: 1.0000 | INHALATION_SPRAY | CUTANEOUS | Status: DC | PRN

## 2024-08-16 MED ORDER — HEPARIN SODIUM (PORCINE) 1000 UNIT/ML DIALYSIS: 1000.0000 [IU] | INTRAMUSCULAR | Status: DC | PRN

## 2024-08-16 MED ORDER — ANTICOAGULANT SODIUM CITRATE 4% (200MG/5ML) IV SOLN
5.0000 mL | Status: DC | PRN
  Filled 2024-08-16: qty 5

## 2024-08-16 MED ORDER — ALTEPLASE 2 MG IJ SOLR: 2.0000 mg | Freq: Once | INTRAMUSCULAR | Status: DC | PRN

## 2024-08-16 MED ORDER — NEPRO/CARBSTEADY PO LIQD
237.0000 mL | ORAL | Status: DC | PRN
  Filled 2024-08-16: qty 237

## 2024-08-16 NOTE — Progress Notes (Signed)
 PT Cancellation Note  Patient Details Name: Thomas [name] MRN: [id number] DOB: [birth year]   Cancelled Treatment:    Reason Eval/Treat Not Completed: Patient at procedure or test/unavailable  To [facility] for HD. [name] PT Acute Rehabilitation Services Office [phone]  [name] 08/16/2024, 10:05 AM

## 2024-08-16 NOTE — ED Notes (Signed)
:  Patient arrived from dialysis, in NAD.

## 2024-08-16 NOTE — Progress Notes (Signed)
 Discussed pt's case with [facility] ED CSW. Navigator can assist with new HD clinic placement if needed due to snf placement. Will assist as needed.   Randine Mungo Dialysis Navigator [phone]

## 2024-08-16 NOTE — Progress Notes (Signed)
 [facility] Progress Note  Subjective:    Vitals:   08/16/24 1011 08/16/24 1016 08/16/24 1132 08/16/24 1202  BP: (!) 142/84 (!) 142/84 (!) 140/103 139/89  Pulse: 74 69 67 64  Resp: 18 12 13  (!) 9  Temp:      TempSrc:      SpO2: 99% 94% 94% 98%  Weight:        Exam: Gen alert, no distress Sclera anicteric, throat clear  No jvd or bruits Chest clear bilat to bases RRR no MRG Abd soft ntnd no mass or ascites +bs Ext no LE edema, R BKA, L TMA Neuro is alert, Ox 3 , nf    TDC    Home bp meds: Clonidine  0.1mg  tid Metoprolol  XL 100mg  every day Midodrine  5mg  tid      OP HD: TTS [facility] Aug  2025 -> 3.5h  2K bath  92kg  Heparin  none       Assessment/ Plan: Gen'd weakness: will need SNF placement. Boarding in ED at [facility].  ESRD: on HD TTS. Plan is for HD today.  HTN: bp's on the high side. Get vol down w/ HD. Resumed home clonidine  and toprol  xl. Holding home midodrine  for now.  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     Myer Fret MD  [facility] 08/16/2024, 1:03 PM  Recent Labs  Lab 08/13/24 1420 08/14/24 0947  HGB 9.7*  --   ALBUMIN  4.3  --   CALCIUM  8.5* 8.3*  CREATININE 7.89* 8.59*  K 4.5 5.0   No results for input(s): IRON , TIBC, FERRITIN in the last 168 hours. Inpatient medications:  apixaban   5 mg Oral BID   atorvastatin   80 mg Oral QHS   Chlorhexidine  Gluconate Cloth  6 each Topical Q0600   cholecalciferol   5,000 Units Oral Daily   cloNIDine   0.1 mg Oral TID   cyanocobalamin   1,000 mcg Oral Daily   lactulose   30 g Oral Daily   levothyroxine   50 mcg Oral QAC breakfast   metoprolol  succinate  100 mg Oral Daily   oxyCODONE   20 mg Oral Q12H   tamsulosin   0.4 mg Oral Daily    anticoagulant sodium citrate      acetaminophen , alteplase , anticoagulant sodium citrate , bisacodyl , feeding supplement (NEPRO CARB STEADY), heparin , lidocaine  (PF), lidocaine -prilocaine , methocarbamol , ondansetron , oxyCODONE ,  pentafluoroprop-tetrafluoroeth

## 2024-08-16 NOTE — Progress Notes (Addendum)
 Pt to remain at [facility] after dialysis. Currently no bed offers but some SNFs are considering. [facility] SW made aware.

## 2024-08-16 NOTE — ED Provider Notes (Signed)
 Emergency Medicine Observation Re-evaluation Note  PERLEY ARTHURS is a 73 y.o. male, seen on rounds today.  Pt initially presented to the ED for complaints of Back Pain Currently, the patient is resting.  Physical Exam  BP (!) 170/77 (BP Location: Right Arm)   Pulse 96   Temp 98.1 F (36.7 C) (Oral)   Resp 18   SpO2 99%  Physical Exam General: nad   ED Course / MDM  EKG:EKG Interpretation Date/Time:  Saturday August 13 2024 15:24:02 EDT Ventricular Rate:  67 PR Interval:  205 QRS Duration:  102 QT Interval:  475 QTC Calculation: 502 R Axis:   -8  Text Interpretation: Sinus rhythm No significant change since last tracing Prolonged QT interval Confirmed by Garrick Charleston [phone]) on 08/13/2024 4:24:11 PM  I have reviewed the labs performed to date as well as medications administered while in observation.  Recent changes in the last 24 hours [facility].  Plan  Current plan is for placement.    Elnor Jayson LABOR, DO 08/16/24 [phone]

## 2024-08-16 NOTE — Progress Notes (Signed)
  Received patient in bed to unit.   Informed consent signed and in chart.    TX duration:3.5     Transported by  Hand-off given to patient's nurse.    Access used: rt CVC Access issues: None Dressing change   Total UF removed: 3000 Medication(s) given: None Post HD VS: 165/99       Hunter Hacking LPN [facility]

## 2024-08-17 MED ORDER — CHLORHEXIDINE GLUCONATE CLOTH 2 % EX PADS
6.0000 | MEDICATED_PAD | Freq: Every day | CUTANEOUS | Status: DC
Start: 2024-08-18 — End: 2024-08-22

## 2024-08-17 NOTE — Progress Notes (Signed)
 [facility] Progress Note  Subjective:  Seen in ED stretcher in the hall No c/o's  Vitals:   08/17/24 0540 08/17/24 1028 08/17/24 1034 08/17/24 1419  BP: 112/77 102/72 102/72   Pulse: 65 68 68   Resp: 16  16   Temp: 97.9 F (36.6 C)  97.9 F (36.6 C) 97.7 F (36.5 C)  TempSrc:   Oral Oral  SpO2: 96%  99%   Weight:        Exam: Gen alert, no distress Sclera anicteric, throat clear  No jvd or bruits Chest clear bilat to bases RRR no MRG Abd soft ntnd no mass or ascites +bs Ext no LE edema, R BKA, L TMA Neuro is alert, Ox 3 , nf    TDC    Home bp meds: Clonidine  0.1mg  tid Metoprolol  XL 100mg  every day Midodrine  5mg  tid      OP HD: TTS [facility] Aug  2025 -> 3.5h  2K bath  92kg  Heparin  none  TDC     Assessment/ Plan: Gen'd weakness: will need SNF placement. Boarding in ED at [facility].  ESRD: on HD TTS. Next HD tomorrow.  HTN: bp's on the high side. Get vol down w/ HD. Getting his home clonidine  and toprol  xl. Holding home midodrine  for now.  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   Myer Fret MD  [facility] 08/17/2024, 3:44 PM  Recent Labs  Lab 08/13/24 1420 08/14/24 0947  HGB 9.7*  --   ALBUMIN  4.3  --   CALCIUM  8.5* 8.3*  CREATININE 7.89* 8.59*  K 4.5 5.0   No results for input(s): IRON , TIBC, FERRITIN in the last 168 hours. Inpatient medications:  apixaban   5 mg Oral BID   atorvastatin   80 mg Oral QHS   Chlorhexidine  Gluconate Cloth  6 each Topical Q0600   cholecalciferol   5,000 Units Oral Daily   cloNIDine   0.1 mg Oral TID   cyanocobalamin   1,000 mcg Oral Daily   lactulose   30 g Oral Daily   levothyroxine   50 mcg Oral QAC breakfast   metoprolol  succinate  100 mg Oral Daily   oxyCODONE   20 mg Oral Q12H   tamsulosin   0.4 mg Oral Daily     acetaminophen , bisacodyl , methocarbamol , ondansetron , oxyCODONE 

## 2024-08-17 NOTE — Progress Notes (Addendum)
 Pt still has no bed offers. CSW contacting facilities listed as considering to try and secure bed offer.   1500: [name] does not have any beds at this time. Awaiting response for [facility].  [facility] expressed concerns about medication rexulti; CSW explained that pt has not received rexulti. [facility] response is still pending.

## 2024-08-17 NOTE — ED Notes (Signed)
 Patient refused vital signs

## 2024-08-18 LAB — BASIC METABOLIC PANEL WITH GFR
Anion gap: 17 — ABNORMAL HIGH (ref 5–15)
BUN: 53 mg/dL — ABNORMAL HIGH (ref 8–23)
CO2: 25 mmol/L (ref 22–32)
Calcium: 8.5 mg/dL — ABNORMAL LOW (ref 8.9–10.3)
Chloride: 93 mmol/L — ABNORMAL LOW (ref 98–111)
Creatinine, Ser: 8.56 mg/dL — ABNORMAL HIGH (ref 0.61–1.24)
GFR, Estimated: 6 mL/min — ABNORMAL LOW (ref >60)
Glucose, Bld: 81 mg/dL (ref 70–99)
Potassium: 5.5 mmol/L — ABNORMAL HIGH (ref 3.5–5.1)
Sodium: 135 mmol/L (ref 135–145)

## 2024-08-18 LAB — CBC WITH DIFFERENTIAL/PLATELET
Abs Immature Granulocytes: 0.01 K/uL (ref 0.00–0.07)
Basophils Absolute: 0 K/uL (ref 0.0–0.1)
Basophils Relative: 1 %
Eosinophils Absolute: 0.3 K/uL (ref 0.0–0.5)
Eosinophils Relative: 5 %
HCT: 29.9 % — ABNORMAL LOW (ref 39.0–52.0)
Hemoglobin: 9.5 g/dL — ABNORMAL LOW (ref 13.0–17.0)
Immature Granulocytes: 0 %
Lymphocytes Relative: 31 %
Lymphs Abs: 1.7 K/uL (ref 0.7–4.0)
MCH: 32.9 pg (ref 26.0–34.0)
MCHC: 31.8 g/dL (ref 30.0–36.0)
MCV: 103.5 fL — ABNORMAL HIGH (ref 80.0–100.0)
Monocytes Absolute: 0.5 K/uL (ref 0.1–1.0)
Monocytes Relative: 9 %
Neutro Abs: 3 K/uL (ref 1.7–7.7)
Neutrophils Relative %: 54 %
Platelets: 181 K/uL (ref 150–400)
RBC: 2.89 MIL/uL — ABNORMAL LOW (ref 4.22–5.81)
RDW: 16 % — ABNORMAL HIGH (ref 11.5–15.5)
WBC: 5.5 K/uL (ref 4.0–10.5)
nRBC: 0 % (ref 0.0–0.2)

## 2024-08-18 MED ORDER — PENTAFLUOROPROP-TETRAFLUOROETH EX AERO: 1.0000 | INHALATION_SPRAY | CUTANEOUS | Status: DC | PRN

## 2024-08-18 MED ORDER — OXYCODONE HCL ER 10 MG PO T12A
30.0000 mg | EXTENDED_RELEASE_TABLET | Freq: Two times a day (BID) | ORAL | Status: DC
  Administered 2024-08-18 – 2024-08-24 (×12): 30 mg via ORAL
  Filled 2024-08-18 (×12): qty 3

## 2024-08-18 MED ORDER — OXYCODONE HCL 5 MG PO TABS
ORAL_TABLET | ORAL | Status: AC
  Filled 2024-08-18: qty 1

## 2024-08-18 MED ORDER — LIDOCAINE HCL (PF) 1 % IJ SOLN: 5.0000 mL | INTRAMUSCULAR | Status: DC | PRN

## 2024-08-18 MED ORDER — HEPARIN SODIUM (PORCINE) 1000 UNIT/ML DIALYSIS
1000.0000 [IU] | INTRAMUSCULAR | Status: DC | PRN
  Administered 2024-08-18: 3200 [IU]
  Filled 2024-08-18: qty 1

## 2024-08-18 MED ORDER — HEPARIN SODIUM (PORCINE) 1000 UNIT/ML IJ SOLN
INTRAMUSCULAR | Status: AC
  Filled 2024-08-18: qty 4

## 2024-08-18 MED ORDER — LIDOCAINE-PRILOCAINE 2.5-2.5 % EX CREA: 1.0000 | TOPICAL_CREAM | CUTANEOUS | Status: DC | PRN

## 2024-08-18 MED ORDER — NEPRO/CARBSTEADY PO LIQD: 237.0000 mL | ORAL | Status: DC | PRN

## 2024-08-18 MED ORDER — ANTICOAGULANT SODIUM CITRATE 4% (200MG/5ML) IV SOLN: 5.0000 mL | Status: DC | PRN

## 2024-08-18 MED ORDER — ALTEPLASE 2 MG IJ SOLR: 2.0000 mg | Freq: Once | INTRAMUSCULAR | Status: DC | PRN

## 2024-08-18 NOTE — ED Provider Notes (Signed)
 Emergency Medicine Observation Re-evaluation Note  Thomas [name] is a 73 y.o. male, seen on rounds today.  Pt initially presented to the ED for complaints of Back Pain Currently, the patient is resting in the room.  Physical Exam  BP 95/60   Pulse 69   Temp 97.8 F (36.6 C) (Oral)   Resp 18   Wt 88.2 kg   SpO2 96%   BMI 23.06 kg/m  Physical Exam General: resting comfortably, NAD Lungs: normal WOB Psych: currently calm and resting  ED Course / MDM  EKG:EKG Interpretation Date/Time:  Saturday August 13 2024 15:24:02 EDT Ventricular Rate:  67 PR Interval:  205 QRS Duration:  102 QT Interval:  475 QTC Calculation: 502 R Axis:   -8  Text Interpretation: Sinus rhythm No significant change since last tracing Prolonged QT interval Confirmed by Garrick Charleston [phone]) on 08/13/2024 4:24:11 PM  I have reviewed the labs performed to date as well as medications administered while in observation.  Recent changes in the last 24 hours include none.  Plan  Current plan is for placement.  Nephrology is following for hemodialysis which she is actively receiving.    [name], DO 08/18/24 [phone]

## 2024-08-18 NOTE — ED Notes (Signed)
 Service Response Kitchen called due to patient not receiving a lunch tray. Per the kitchen they will put in a ticket for patient to receive a tray and put a rush on it.

## 2024-08-18 NOTE — Progress Notes (Addendum)
 [facility] Progress Note  Subjective:  Seen in ED stretcher in the hall No c/o's  Vitals:   08/18/24 1030 08/18/24 1100 08/18/24 1106 08/18/24 1109  BP: 99/63 98/79 110/68 (!) 140/58  Pulse: 72 67 65 74  Resp: 18  12 11   Temp:    (!) 97.5 F (36.4 C)  TempSrc:      SpO2: 98% 95% 95% 97%  Weight:    88.2 kg    Exam: Gen alert, no distress Sclera anicteric, throat clear  No jvd or bruits Chest clear bilat to bases RRR no MRG Abd soft ntnd no mass or ascites +bs Ext no LE edema, R BKA, L TMA Neuro is alert, Ox 3 , nf    TDC    Home bp meds: Clonidine  0.1mg  tid Metoprolol  XL 100mg  every day Midodrine  5mg  tid      OP HD: TTS [facility] Aug  2025 -> 3.5h  2K bath  92kg  Heparin  none  TDC     Assessment/ Plan: Gen'd weakness: will need SNF placement. Boarding in ED at [facility].  ESRD: on HD TTS. HD today on schedule.  HTN: bp's lower on home clonidine  and toprol  WL. Euvolemic on exam. 3-4kg under dry wt, on RA. Holding home midodrine  for now. May need to keep even w/ next HD.  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   Myer Fret MD  [facility] 08/18/2024, 12:01 PM  Recent Labs  Lab 08/13/24 1420 08/14/24 0947 08/18/24 0704  HGB 9.7*  --  9.5*  ALBUMIN  4.3  --   --   CALCIUM  8.5* 8.3* 8.5*  CREATININE 7.89* 8.59* 8.56*  K 4.5 5.0 5.5*   No results for input(s): IRON , TIBC, FERRITIN in the last 168 hours. Inpatient medications:  apixaban   5 mg Oral BID   atorvastatin   80 mg Oral QHS   Chlorhexidine  Gluconate Cloth  6 each Topical Q0600   Chlorhexidine  Gluconate Cloth  6 each Topical Q0600   cholecalciferol   5,000 Units Oral Daily   cloNIDine   0.1 mg Oral TID   cyanocobalamin   1,000 mcg Oral Daily   lactulose   30 g Oral Daily   levothyroxine   50 mcg Oral QAC breakfast   metoprolol  succinate  100 mg Oral Daily   oxyCODONE   30 mg Oral Q12H   tamsulosin   0.4 mg Oral Daily    anticoagulant sodium citrate       acetaminophen ,  alteplase , anticoagulant sodium citrate , bisacodyl , feeding supplement (NEPRO CARB STEADY), heparin , lidocaine  (PF), lidocaine -prilocaine , methocarbamol , ondansetron , oxyCODONE , pentafluoroprop-tetrafluoroeth

## 2024-08-18 NOTE — ED Notes (Signed)
 Pt asking for 2 ham and cheese sandwiches because he did not like his dinner. The kitchen called to order the patient ham and cheese sandwiches. Per the kitchen personnel the ED can only order house trays. Explained this information to the patient. Asked patient if he wanted a turkey sandwich of of the unit. Pt stated no.

## 2024-08-18 NOTE — ED Notes (Signed)
Pt resting. No complaints at this time

## 2024-08-18 NOTE — Progress Notes (Signed)
   08/18/24 1109  Vitals  Temp (!) 97.5 F (36.4 C)  Pulse Rate 74  Resp 11  BP (!) 140/58  SpO2 97 %  O2 Device Room Air  Weight 88.2 kg  Oxygen Therapy  Patient Activity (if Appropriate) In bed  Pulse Oximetry Type Continuous  Oximetry Probe Site Changed No   Received patient in bed to unit.  Alert and oriented.  Informed consent signed and in chart.   TX duration:3.0  Patient tolerated well.  Transported back to the room  Alert, without acute distress.  Hand-off given to patient's nurse.   Access used: [facility] Access issues: lines reversed mid tx  Total UF removed: 1000 Medication(s) given: oxy 5mg  po x 1   [name]

## 2024-08-18 NOTE — ED Notes (Signed)
 Pt given 3 peanut butters, 4 saltine crackers, and 2 graham cracker packs. Pt stated he didn't eat dinner.

## 2024-08-18 NOTE — ED Notes (Signed)
Pt resting in bed. No complaints.

## 2024-08-18 NOTE — ED Notes (Signed)
 Pt states he takes 30 mg Oxycontin  q8h for the past 20 years. RN informed ordered dose. Pt requested to see if adjustment could be made.

## 2024-08-18 NOTE — Progress Notes (Signed)
 Per Tammy with [facility], no bed availability at [facility] at this time but they are able to consider pt for [facility].  If pt agreeable to [facility], will need to change HD centers.  Pt currently in HD.   Julien Das, MSW, LCSW [phone] (coverage)

## 2024-08-18 NOTE — ED Notes (Signed)
 Pt upset about not receiving lunch. Pt due for medications. Pt states he does not want to take medications on an empty stomach. Pt given saltine crackers to take after he takes the pills. Pt states continues to state he does not want to take his medication. Explained to patient that his medication is already opened and I have provided a snack to help ease his stomach. Pt took medication and threw medication cup across the room. Explained to patient that he cannot throw things at staff. Pt also given soda with the crackers.

## 2024-08-19 MED ORDER — MIDODRINE HCL 5 MG PO TABS
5.0000 mg | ORAL_TABLET | Freq: Once | ORAL | Status: AC
Start: 2024-08-19 — End: 2024-08-19
  Administered 2024-08-19: 5 mg via ORAL
  Filled 2024-08-19: qty 1

## 2024-08-19 MED ORDER — SODIUM CHLORIDE 0.9 % IV BOLUS
500.0000 mL | Freq: Once | INTRAVENOUS | Status: AC
  Administered 2024-08-19: 500 mL via INTRAVENOUS

## 2024-08-19 MED ORDER — CHLORHEXIDINE GLUCONATE CLOTH 2 % EX PADS: 6.0000 | MEDICATED_PAD | Freq: Every day | CUTANEOUS | Status: DC

## 2024-08-19 MED ORDER — SODIUM CHLORIDE 0.9 % IV BOLUS: 100.0000 mL | INTRAVENOUS | Status: AC | PRN

## 2024-08-19 NOTE — ED Provider Notes (Addendum)
  [place] EMERGENCY DEPARTMENT AT [facility] Emergency Medicine Observation Re-evaluation Note  [name] is a 73 y.o. male, seen on rounds today.  Pt initially presented on 08/13/24 at 1305 to the ED for complaints of  Chief Complaint  Patient presents with   Back Pain  Presented for diffuse pain weakness and concern for falls, unable to care for himself at home. PMHx: ESRD on dialysis, chronic pain, prior BKA, T2DM, HTN, GERD, chronic osteomyelitis, prior thoracic spinal cord injury, prior DVT/PE on Eliquis  Currently, the patient is sleeping quietly in bed..  Physical Exam  BP 93/68   Pulse 73   Temp 97.9 F (36.6 C)   Resp 19   Wt 88.2 kg   SpO2 100%   BMI 23.06 kg/m  Physical Exam General: NAD Lungs: Normal effort Psych: Currently calm  ED Course / MDM  EKG:EKG Interpretation Date/Time:  Saturday August 13 2024 15:24:02 EDT Ventricular Rate:  67 PR Interval:  205 QRS Duration:  102 QT Interval:  475 QTC Calculation: 502 R Axis:   -8  Text Interpretation: Sinus rhythm No significant change since last tracing Prolonged QT interval Confirmed by Garrick Charleston [phone]) on 08/13/2024 4:24:11 PM  I have reviewed the labs performed to date as well as medications administered while in observation.  Recent changes in the last 24 hours include regular dialysis yesterday.  Social work notes reveal that they are evaluating various sniffs including potentially [place]. Home medications: Reordered by prior team Diet: Reordered by prior team  Plan  Current plan is for awaiting placement.  Patient did have low blood pressures during my shift, given 500 cc LR bolus, patient asymptomatic.  Does appear that patient has a history of low blood pressures, is on chronic midodrine  which has not been administered previously today, requested that RN administer midodrine  and repeat blood pressure thereafter   Rogelia Jerilynn RAMAN, MD 08/19/24 [id number]    Rogelia Jerilynn RAMAN, MD 08/20/24 [phone]

## 2024-08-19 NOTE — Progress Notes (Signed)
 Contacted by CSW regarding pt being accepted at snf in [place] if HD clinic can be changed. Referral submitted to [facility] admissions this afternoon with request for [place], [place], or [place]. Will await determination. Update provided to CSW and nephrologist. Will assist as needed.   Randine Mungo Dialysis Navigator [phone]

## 2024-08-19 NOTE — Progress Notes (Addendum)
 CSW spoke with Tammy with [facility]; they actually do not currently have a bed.  Their other facility, [facility], may have a bed in the next few days depending on a potential current resident discharging from that facility.   1315: CSW notified by [facility] they can offer bed for pt. Would need OPHD to be changed to a [place] location. CSW met with pt and discussed bed offer. Pt is agreeable to [facility]. CSW explained DC would be dependent on when OPHD is changed and when SNF authorization is received.   CSW notified renal navigator who will work on changing OPHD location.   ICM to initiate SNF auth once new therapy note is available.

## 2024-08-19 NOTE — Progress Notes (Signed)
 [facility] Progress Note  Subjective:  Seen in ED room No c/o's  Vitals:   08/19/24 0634 08/19/24 0819 08/19/24 1020 08/19/24 1022  BP: 93/68 97/62 (!) 83/65   Pulse: 73 74 76 77  Resp: 19   20  Temp: 97.9 F (36.6 C)   98.2 F (36.8 C)  TempSrc:    Oral  SpO2: 100% 99%  99%  Weight:        Exam: Gen alert, no distress Sclera anicteric, throat clear  No jvd or bruits Chest clear bilat to bases RRR no MRG Abd soft ntnd no mass or ascites +bs Ext no LE edema, R BKA, L TMA Neuro is alert, Ox 3 , nf    TDC    Home bp meds: Clonidine  0.1mg  tid Metoprolol  XL 100mg  every day Midodrine  5mg  tid      OP HD: TTS [facility] Aug  2025 -> 3.5h  2K bath  92kg  Heparin  none  TDC     Assessment/ Plan: Gen'd weakness: will need SNF placement. Boarding in ED at [facility].  ESRD: on HD TTS. Next HD Sat.  HTN: bp's dropped on home clonidine  and toprol  XL, will dc toprol  xl and cont clonidine  w/ hold orders. Euvolemic on exam. 3-4kg under dry wt, on RA. Will keep even w/ HD Saturday.  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   Myer Fret MD  [facility] 08/19/2024, 10:39 AM  Recent Labs  Lab 08/13/24 1420 08/14/24 0947 08/18/24 0704  HGB 9.7*  --  9.5*  ALBUMIN  4.3  --   --   CALCIUM  8.5* 8.3* 8.5*  CREATININE 7.89* 8.59* 8.56*  K 4.5 5.0 5.5*   No results for input(s): IRON , TIBC, FERRITIN in the last 168 hours. Inpatient medications:  apixaban   5 mg Oral BID   atorvastatin   80 mg Oral QHS   Chlorhexidine  Gluconate Cloth  6 each Topical Q0600   Chlorhexidine  Gluconate Cloth  6 each Topical Q0600   cholecalciferol   5,000 Units Oral Daily   cloNIDine   0.1 mg Oral TID   cyanocobalamin   1,000 mcg Oral Daily   lactulose   30 g Oral Daily   levothyroxine   50 mcg Oral QAC breakfast   metoprolol  succinate  100 mg Oral Daily   oxyCODONE   30 mg Oral Q12H   tamsulosin   0.4 mg Oral Daily      acetaminophen , bisacodyl , methocarbamol , ondansetron ,  oxyCODONE 

## 2024-08-19 NOTE — ED Notes (Signed)
 Assuming pt care, pt laying in bed aaox1 to self, limited range of motion, denies SI/HI, confused with conversation. Pt was wanded by security, room was arrange for safety, all belongings taken to designated area, pt changed into paper scrubs. Voluntary admission waiting on placement.

## 2024-08-20 LAB — RENAL FUNCTION PANEL
Albumin: 3.2 g/dL — ABNORMAL LOW (ref 3.5–5.0)
Anion gap: 14 (ref 5–15)
BUN: 55 mg/dL — ABNORMAL HIGH (ref 8–23)
CO2: 25 mmol/L (ref 22–32)
Calcium: 7.8 mg/dL — ABNORMAL LOW (ref 8.9–10.3)
Chloride: 96 mmol/L — ABNORMAL LOW (ref 98–111)
Creatinine, Ser: 8.63 mg/dL — ABNORMAL HIGH (ref 0.61–1.24)
GFR, Estimated: 6 mL/min — ABNORMAL LOW (ref >60)
Glucose, Bld: 93 mg/dL (ref 70–99)
Phosphorus: 7.3 mg/dL — ABNORMAL HIGH (ref 2.5–4.6)
Potassium: 5.4 mmol/L — ABNORMAL HIGH (ref 3.5–5.1)
Sodium: 135 mmol/L (ref 135–145)

## 2024-08-20 LAB — CBC
HCT: 29.4 % — ABNORMAL LOW (ref 39.0–52.0)
Hemoglobin: 9.2 g/dL — ABNORMAL LOW (ref 13.0–17.0)
MCH: 32.6 pg (ref 26.0–34.0)
MCHC: 31.3 g/dL (ref 30.0–36.0)
MCV: 104.3 fL — ABNORMAL HIGH (ref 80.0–100.0)
Platelets: 178 K/uL (ref 150–400)
RBC: 2.82 MIL/uL — ABNORMAL LOW (ref 4.22–5.81)
RDW: 15.8 % — ABNORMAL HIGH (ref 11.5–15.5)
WBC: 5.2 K/uL (ref 4.0–10.5)
nRBC: 0 % (ref 0.0–0.2)

## 2024-08-20 MED ORDER — LIDOCAINE-PRILOCAINE 2.5-2.5 % EX CREA: 1.0000 | TOPICAL_CREAM | CUTANEOUS | Status: DC | PRN

## 2024-08-20 MED ORDER — ALTEPLASE 2 MG IJ SOLR: 2.0000 mg | Freq: Once | INTRAMUSCULAR | Status: DC | PRN

## 2024-08-20 MED ORDER — PENTAFLUOROPROP-TETRAFLUOROETH EX AERO: 1.0000 | INHALATION_SPRAY | CUTANEOUS | Status: DC | PRN

## 2024-08-20 MED ORDER — NEPRO/CARBSTEADY PO LIQD: 237.0000 mL | ORAL | Status: DC | PRN

## 2024-08-20 MED ORDER — HEPARIN SODIUM (PORCINE) 1000 UNIT/ML IJ SOLN
INTRAMUSCULAR | Status: AC
  Filled 2024-08-20: qty 4

## 2024-08-20 MED ORDER — HEPARIN SODIUM (PORCINE) 1000 UNIT/ML IJ SOLN
3200.0000 [IU] | Freq: Once | INTRAMUSCULAR | Status: AC
  Administered 2024-08-20: 3200 [IU]

## 2024-08-20 MED ORDER — HEPARIN SODIUM (PORCINE) 1000 UNIT/ML DIALYSIS
1000.0000 [IU] | INTRAMUSCULAR | Status: DC | PRN
Start: 2024-08-20 — End: 2024-08-20

## 2024-08-20 MED ORDER — LIDOCAINE HCL (PF) 1 % IJ SOLN: 5.0000 mL | INTRAMUSCULAR | Status: DC | PRN

## 2024-08-20 MED ORDER — ANTICOAGULANT SODIUM CITRATE 4% (200MG/5ML) IV SOLN: 5.0000 mL | Status: DC | PRN

## 2024-08-20 NOTE — Progress Notes (Signed)
 [facility] Progress Note  Subjective:  Seen in ED room No c/o's  Vitals:   08/20/24 1100 08/20/24 1122 08/20/24 1130 08/20/24 1145  BP: 100/81 (!) 113/90 120/74 109/70  Pulse: 72 73 67 71  Resp: 18 16 13 20   Temp:  98 F (36.7 C)    TempSrc:  Oral    SpO2: 100% 100% 98% 97%  Weight:  90.7 kg      Exam: Gen alert, no distress Sclera anicteric, throat clear  No jvd or bruits Chest clear bilat to bases RRR no MRG Abd soft ntnd no mass or ascites +bs Ext no LE edema, R BKA, L TMA Neuro is alert, Ox 3 , nf    TDC    Home bp meds: Clonidine  0.1mg  tid Metoprolol  XL 100mg  every day Midodrine  5mg  tid      OP HD: T[facility] Aug  2025 -> 3.5h  2K bath  92kg  Heparin  none  TDC     Assessment/ Plan: Gen'd weakness: needs SNF placement. Boarding in ED at [facility].  ESRD: on HD TTS. Next HD today.  HTN: bp's dropped on home clonidine  and toprol  XL. 1- 2kg under dry wt, on RA. Kept him even w/ HD today but still dropped BP's on HD. Will dc the clonidine .  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   Myer Fret MD  [facility] 08/20/2024, 12:25 PM  Recent Labs  Lab 08/13/24 1420 08/14/24 0947 08/18/24 0704 08/20/24 1015  HGB 9.7*  --  9.5* 9.2*  ALBUMIN  4.3  --   --   --   CALCIUM  8.5* 8.3* 8.5*  --   CREATININE 7.89* 8.59* 8.56*  --   K 4.5 5.0 5.5*  --    No results for input(s): IRON , TIBC, FERRITIN in the last 168 hours. Inpatient medications:  apixaban   5 mg Oral BID   atorvastatin   80 mg Oral QHS   Chlorhexidine  Gluconate Cloth  6 each Topical Q0600   Chlorhexidine  Gluconate Cloth  6 each Topical Q0600   cholecalciferol   5,000 Units Oral Daily   cloNIDine   0.1 mg Oral TID   cyanocobalamin   1,000 mcg Oral Daily   lactulose   30 g Oral Daily   levothyroxine   50 mcg Oral QAC breakfast   oxyCODONE   30 mg Oral Q12H   tamsulosin   0.4 mg Oral Daily      acetaminophen , bisacodyl , methocarbamol , ondansetron , oxyCODONE 

## 2024-08-20 NOTE — ED Notes (Signed)
 Pt tx to Dialysis by RN

## 2024-08-20 NOTE — Progress Notes (Signed)
 Physical Therapy Treatment Patient Details Name: [name] MRN: [id number] DOB: 29-Jan-1951 Today's Date: 08/20/2024   History of Present Illness 73 y.o. male came to ED with  complaints of Back Pain, weakness, missed HD.  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 Practiced rolling today to perform pericare, with which pt required modA to initiate reaching for handrail. Once in sidelying, pt could remain in this position but required repeated verbal cueing to do so. For sidelying>sit, he only required CGA and was able to maintain sitting balance during EOB exercises. In sitting, he demonstrated very notable thoracic kyphosis, and when prompted to correct it, he stated he could not right now. Performed UE and LE exercises bilaterally, using pt's personal blue theraband for UE exercises. He seems motivated to continue doing exercises at least once per day to keep up some strength. Pt was not willing to attempt ambulating today as he stated his prosthetic does not fit right anymore after he lost quite a bit of weight. Recommending <3hrs post-acute rehab upon d/c to continue improving functional mobility. Acute PT to follow.    If plan is discharge home, recommend the following: Two people to help with walking and/or transfers;Two people to help with bathing/dressing/bathroom;Help with stairs or ramp for entrance;Assist for transportation;Assistance with cooking/housework   Can travel by private vehicle     No  Equipment Recommendations  Wheelchair (measurements PT);Wheelchair cushion (measurements PT)    Recommendations for Other Services       Precautions / Restrictions Precautions Precautions: Fall Recall of Precautions/Restrictions: Intact Precaution/Restrictions Comments: L TMA, R BKA Restrictions Weight Bearing  Restrictions Per Provider Order: No Other Position/Activity Restrictions: Has prosthesis for R, but states it no longer fits right as he has lost a lot of weight.     Mobility  Bed Mobility Overal bed mobility: Needs Assistance Bed Mobility: Rolling, Sidelying to Sit, Sit to Supine Rolling: Mod assist Sidelying to sit: Contact guard assist   Sit to supine: Supervision   General bed mobility comments: Required hand over hand guidance to grab rail to assist in rolling to side. Once in sidelying, pt could remain there without physical assist but requires repeated verbal cues not to roll back over.    Transfers                        Ambulation/Gait                   Stairs             Wheelchair Mobility     Tilt Bed    Modified Rankin (Stroke Patients Only)       Balance Overall balance assessment: Needs assistance Sitting-balance support: Single extremity supported, Feet unsupported Sitting balance-[name]: Good Sitting balance - Comments: Pt is able to shift weight to R/L and A/P and recover to midline.                                    Communication Communication Communication: No apparent difficulties  Cognition Arousal: Alert Behavior During Therapy: [facility] for tasks assessed/performed  Following commands: Intact      Cueing Cueing Techniques: Verbal cues, Gestural cues  Exercises General Exercises - Upper Extremity Shoulder Horizontal ABduction: AROM, Both, Theraband, Supine (Blue; 25 reps at pt request) Elbow Flexion: AROM, Strengthening, Both, Supine (Blue; 25 reps at pt request) General Exercises - Lower Extremity Ankle Circles/Pumps: AROM, Left, 10 reps, Seated Long Arc Quad: AROM, Strengthening, Both, 10 reps, Seated Hip ABduction/ADduction: AROM, Both, 10 reps, Strengthening, Seated    General Comments General comments (skin integrity, edema, etc.): When asked if pt  wanted to try to walk today, he stated he could not because his prosthetic does not fit right. Pt appears very motivated to do UE and LE exercises with theraband.      Pertinent Vitals/Pain Pain Assessment Pain Assessment: Faces Faces Pain Scale: Hurts even more Pain Location: Upper thoracic area and neck Pain Descriptors / Indicators: Grimacing, Discomfort    Home Living                          Prior Function            PT Goals (current goals can now be found in the care plan section) Acute Rehab PT Goals PT Goal Formulation: With patient Time For Goal Achievement: 08/28/24 Potential to Achieve Goals: Fair Progress towards PT goals: Progressing toward goals    Frequency    Min 1X/week      PT Plan      Co-evaluation              AM-PAC PT 6 Clicks Mobility   Outcome Measure  Help needed turning from your back to your side while in a flat bed without using bedrails?: A Lot Help needed moving from lying on your back to sitting on the side of a flat bed without using bedrails?: A Little Help needed moving to and from a bed to a chair (including a wheelchair)?: A Lot Help needed standing up from a chair using your arms (e.g., wheelchair or bedside chair)?: A Lot Help needed to walk in hospital room?: A Lot Help needed climbing 3-5 steps with a railing? : Total 6 Click Score: 12    End of Session Equipment Utilized During Treatment: Gait belt Activity Tolerance: Patient tolerated treatment well;Patient limited by pain Patient left: in bed;with call bell/phone within reach Nurse Communication: Mobility status PT Visit Diagnosis: Other abnormalities of gait and mobility (R26.89);Muscle weakness (generalized) (M62.81);History of falling (Z91.81)     Time: 0218-0241 PT Time Calculation (min) (ACUTE ONLY): 23 min  Charges:    $Therapeutic Exercise: 8-22 mins $Therapeutic Activity: 8-22 mins PT General Charges $$ ACUTE PT VISIT: 1 Visit                      Shaquila Sigman, SPT    Terion Hedman 08/20/2024, 3:19 PM

## 2024-08-20 NOTE — Progress Notes (Signed)
 Received patient in bed to unit.  Alert and oriented.  Informed consent signed and in chart.   TX duration:3.5  Patient tolerated well.  Transported back to the room  Alert, without acute distress.  Hand-off given to patient's nurse.   Access used: Right internal jugular HD Cath Access issues: none  Total Normal Saline given: Medication(s) given: Eliquis    08/20/24 1122  Vitals  Temp 98 F (36.7 C)  Temp Source Oral  BP (!) 113/90  MAP (mmHg) 97  Pulse Rate 73  ECG Heart Rate 74  Resp 16  Weight 90.7 kg  Type of Weight Post-Dialysis  Oxygen Therapy  SpO2 100 %  O2 Device Room Air  During Treatment Monitoring  HD Safety Checks Performed Yes  Intra-Hemodialysis Comments Tx completed  Post Treatment  Dialyzer Clearance Lightly streaked  Hemodialysis Intake (mL) 800 mL  Liters Processed 83.9  Tolerated HD Treatment No (Comment)  Post-Hemodialysis Comments Low BP  Hemodialysis Catheter Right Internal jugular Double lumen Permanent (Tunneled)  Placement Date/Time: 07/22/24 1221   Placed prior to admission: No  Serial / Lot #: [id number]  Expiration Date: 05/31/28  Time Out: Correct patient;Correct site;Correct procedure  Maximum sterile barrier precautions: Hand hygiene;Cap;Mask;Sterile gow...  Site Condition No complications  Blue Lumen Status Flushed;Antimicrobial dead end cap;Heparin  locked  Red Lumen Status Flushed;Antimicrobial dead end cap;Heparin  locked  Purple Lumen Status N/A  Catheter fill solution Heparin  1000 units/ml  Catheter fill volume (Arterial) 1.6 cc  Catheter fill volume (Venous) 1.6  Dressing Type Transparent  Dressing Status Antimicrobial disc/dressing in place;Clean, Dry, Intact  Drainage Description None  Dressing Change Due 08/27/24     Camellia Brasil LPN Kidney Dialysis [facility]

## 2024-08-20 NOTE — Progress Notes (Signed)
 Renal navigator submitted HD referral to [facility] requesting new HD site on 08/19/24. Referral is still pending at this time and no determination has been made. ICM will start insurance auth once patient receives new HD site and schedule.

## 2024-08-21 MED ORDER — CLONIDINE HCL 0.2 MG PO TABS
0.1000 mg | ORAL_TABLET | Freq: Three times a day (TID) | ORAL | Status: DC
  Administered 2024-08-21 – 2024-08-22 (×4): 0.1 mg via ORAL
  Filled 2024-08-21 (×6): qty 1

## 2024-08-21 NOTE — ED Provider Notes (Signed)
 Emergency Medicine Observation Re-evaluation Note  Thomas [name] is a 73 y.o. male, seen on rounds today.  Pt initially presented to the ED for complaints of Back Pain Currently, the patient is resting quietly.  Physical Exam  BP (!) 147/81 (BP Location: Right Arm)   Pulse (!) 57   Temp 98.6 F (37 C) (Oral)   Resp 20   Wt 90.7 kg   SpO2 100%   BMI 23.71 kg/m  Physical Exam General: No acute distress Cardiac: Well-perfused Lungs: Nonlabored Psych: Calm  ED Course / MDM  EKG:EKG Interpretation Date/Time:  Saturday August 13 2024 15:24:02 EDT Ventricular Rate:  67 PR Interval:  205 QRS Duration:  102 QT Interval:  475 QTC Calculation: 502 R Axis:   -8  Text Interpretation: Sinus rhythm No significant change since last tracing Prolonged QT interval Confirmed by Garrick Charleston [phone]) on 08/13/2024 4:24:11 PM  I have reviewed the labs performed to date as well as medications administered while in observation.  Recent changes in the last 24 hours include patient had dialysis.  Plan  Current plan is for social work trying to work on placement.    Towana Ozell BROCKS, MD 08/21/24 1059

## 2024-08-21 NOTE — Progress Notes (Signed)
 [facility] Progress Note  Subjective:  Seen in ED room No c/o's  Vitals:   08/21/24 0915 08/21/24 0930 08/21/24 0945 08/21/24 1015  BP:    (!) 147/81  Pulse: (!) 58 64 61 (!) 57  Resp:    20  Temp:    98.6 F (37 C)  TempSrc:    Oral  SpO2: 100% 100% 100% 100%  Weight:        Exam: Gen alert, no distress Sclera anicteric, throat clear  No jvd or bruits Chest clear bilat to bases RRR no MRG Abd soft ntnd no mass or ascites +bs Ext no LE edema, R BKA, L TMA Neuro is alert, Ox 3 , nf    TDC    Home bp meds: Clonidine  0.1mg  tid Metoprolol  XL 100mg  every day Midodrine  5mg  tid      OP HD: TTS [facility] Aug  2025 -> 3.5h  2K bath  92kg  Heparin  none  TDC     Assessment/ Plan: Gen'd weakness: needs SNF placement. Boarding in ED at [facility].  ESRD: on HD TTS. Next HD 11/04.  HTN: started home clonidine  and toprol  XL, then bp's dropped.  The toprol  xl was dc'd 10/29. We kept even w/ HD Sat but still dropped BP's on HD. Clonidine  was dc'd yesterday, but then today BP's are high again. Will resume clonidine  at 0.1mg  tid w/ hold orders for SBP < 125.  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  Dispo: awaiting SNF placement  Myer Fret MD  [facility] 08/21/2024, 12:42 PM  Recent Labs  Lab 08/18/24 0704 08/20/24 1015  HGB 9.5* 9.2*  ALBUMIN   --  3.2*  CALCIUM  8.5* 7.8*  PHOS  --  7.3*  CREATININE 8.56* 8.63*  K 5.5* 5.4*   No results for input(s): IRON , TIBC, FERRITIN in the last 168 hours. Inpatient medications:  apixaban   5 mg Oral BID   atorvastatin   80 mg Oral QHS   Chlorhexidine  Gluconate Cloth  6 each Topical Q0600   Chlorhexidine  Gluconate Cloth  6 each Topical Q0600   cholecalciferol   5,000 Units Oral Daily   cyanocobalamin   1,000 mcg Oral Daily   lactulose   30 g Oral Daily   levothyroxine   50 mcg Oral QAC breakfast   oxyCODONE   30 mg Oral Q12H   tamsulosin   0.4 mg Oral Daily      acetaminophen , bisacodyl , methocarbamol ,  ondansetron , oxyCODONE 

## 2024-08-21 NOTE — ED Notes (Signed)
 Alert, NAD, calm, interactive. No changes. Eating.

## 2024-08-21 NOTE — ED Notes (Signed)
 Restless sleeping, calm, NAD, resps e/u, sonorous resps.

## 2024-08-21 NOTE — ED Notes (Signed)
 Pt alert, NAD, calm, interactive, denies questions or needs, VSS. Urinal emptied. Eating breakfast. Meds taken w/o difficulty.

## 2024-08-22 MED ORDER — CHLORHEXIDINE GLUCONATE CLOTH 2 % EX PADS: 6.0000 | MEDICATED_PAD | Freq: Every day | CUTANEOUS | Status: DC

## 2024-08-22 NOTE — Progress Notes (Signed)
 [facility] Progress Note  Subjective:  Last HD on 11/1 with 800 mL provided with HD per charting.  Also per charting, patient is being transferred to [facility] TTS for HD.  Just woke up and eating a burger from [employer].   Review of systems:   Denies shortness of breath or chest pain Denies n/v  Vitals:   08/21/24 1330 08/21/24 1609 08/21/24 1941 08/22/24 0951  BP:  (!) 158/88 (!) 140/85 (!) 145/92  Pulse: 67 67 93 66  Resp:  20 16 18   Temp:  98.9 F (37.2 C) 97.6 F (36.4 C) 97.7 F (36.5 C)  TempSrc:  Oral Oral Oral  SpO2: 100% 100% 100% 100%  Weight:        Physical Exam:   General adult male in bed in no acute distress HEENT normocephalic atraumatic extraocular movements intact sclera anicteric Neck supple trachea midline Lungs clear to auscultation bilaterally normal work of breathing at rest  Heart S1S2 no rub Abdomen soft nontender nondistended Extremities trace edema left leg and right BKA Psych normal mood and affect Neuro - alert and oriented x 3 provides hx and follows commands  Access RIJ tunn catheter    Home bp meds: Clonidine  0.1mg  tid Metoprolol  XL 100mg  every day Midodrine  5mg  tid      OP HD: TTS [facility] Aug  2025 -> 3.5h  2K bath  92kg  Heparin  none  TDC     Assessment/ Plan: Gen'd weakness: needs SNF placement. Boarding in ED at [facility] awaiting same ESRD: on HD TTS. Next HD 11/04. He will be at [facility] TTS while at [facility] per charting.   HTN: per charting, started home clonidine  and toprol  XL, then bp's dropped.  The toprol  xl was dc'd 10/29. We kept even w/ HD Sat but still dropped BP's on HD. Clonidine  was dc'd yesterday, but then today BP's are high again. Clonidine  was resumed at 0.1mg  tid w/ hold orders for SBP < 125.  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  Dispo: awaiting SNF placement    Recent Labs  Lab 08/18/24 0704 08/20/24 1015  HGB 9.5* 9.2*  ALBUMIN   --  3.2*  CALCIUM  8.5* 7.8*  PHOS   --  7.3*  CREATININE 8.56* 8.63*  K 5.5* 5.4*   No results for input(s): IRON , TIBC, FERRITIN in the last 168 hours. Inpatient medications:  apixaban   5 mg Oral BID   atorvastatin   80 mg Oral QHS   Chlorhexidine  Gluconate Cloth  6 each Topical Q0600   Chlorhexidine  Gluconate Cloth  6 each Topical Q0600   cholecalciferol   5,000 Units Oral Daily   cloNIDine   0.1 mg Oral TID   cyanocobalamin   1,000 mcg Oral Daily   lactulose   30 g Oral Daily   levothyroxine   50 mcg Oral QAC breakfast   oxyCODONE   30 mg Oral Q12H   tamsulosin   0.4 mg Oral Daily      acetaminophen , bisacodyl , methocarbamol , ondansetron , oxyCODONE     Thomas JAYSON Saba, MD 2:45 PM 08/22/2024

## 2024-08-22 NOTE — Discharge Planning (Signed)
  [facility]ialysis Discharge Orders    Patient Name: Thomas [name]  Admission Date: 08/13/2024 Discharge Date: 08/22/24  Dialysis Unit: Transferred to [facility] TTS  Admitting Diagnosis: Weakness [R53.1] Fall, initial encounter [W19.XXXA] ESRD HTN   Discharge Labs: Basic Metabolic Panel: Recent Labs  Lab 08/18/24 0704 08/20/24 1015  NA 135 135  K 5.5* 5.4*  CL 93* 96*  CO2 25 25  GLUCOSE 81 93  BUN 53* 55*  CREATININE 8.56* 8.63*  CALCIUM  8.5* 7.8*  PHOS  --  7.3*   CBC: Recent Labs  Lab 08/18/24 0704 08/20/24 1015  WBC 5.5 5.2  NEUTROABS 3.0  --   HGB 9.5* 9.2*  HCT 29.9* 29.4*  MCV 103.5* 104.3*  PLT 181 178    Dialysis Orders: 4 hr 180 dialyzer BFR 400 DFR 800 EDW 90.5kg 2K/2.5Ca bath TDC no UF or Na profile  HD meds: Heparin   - no bolus, locks are ok VDRA - per protocol, wasn't getting previously? ESA - start Mircera 50mcg IV q 2 weeks Venofer - per protocol  Other/Appts/Lab orders: - Pls check monthly labs on 1st day - Going to SNF [facility])   Lamarr JONELLE Boehringer, PA-C 08/22/2024, 1:15 PM  [employer] [phone]

## 2024-08-22 NOTE — ED Provider Notes (Signed)
  Physical Exam  BP (!) 140/85   Pulse 93   Temp 97.6 F (36.4 C) (Oral)   Resp 16   Wt 90.7 kg   SpO2 100%   BMI 23.71 kg/m   Physical Exam  Procedures  Procedures  ED Course / MDM    Medical Decision Making Amount and/or Complexity of Data Reviewed Labs: ordered.  Risk OTC drugs. Prescription drug management.   Patient still working on skilled nursing placement.       Patsey Lot, MD 08/22/24 [phone]

## 2024-08-22 NOTE — Progress Notes (Signed)
 OPHD has been arranged. CSW updated facility.  [facility] Transitions requesting updated PT note with pt demonstrating out of bed activities.  CSW uploaded most recent PT note.   SNF [facility] is pending.

## 2024-08-22 NOTE — Progress Notes (Addendum)
 Contacted [facility] admissions this morning with request for update on pt's referral for local [facility] due to snf placement. Inquired if it would be possible to get approval today since pt in the ED. Update provided to CSW. Will assist as needed.   Randine Mungo Dialysis Navigator [phone]  Addendum at 1:14 pm: Pt has been accepted at [facility] on TTS 10:40 am chair time. Pt can start tomorrow and will need to arrive at 10:00 am to complete paperwork prior to treatment. Update provided to ED CSW and renal PA. Will add HD arrangements to AVS and requested that renal PA to please send orders to clinic.   Addendum at 4:05 pm: SNF [facility] is still pending per CSW. Contacted [facility] to be advised that pt will not be starting tomorrow but hopefully Thursday. Contacted inpt HD unit to request 1st shift HD tomorrow for pt so pt can d/c to snf tomorrow in a timely manner if snf shara is received.

## 2024-08-22 NOTE — Progress Notes (Signed)
 Pt needing OPHD arranged in [place] to go to [facility] OPHD [place] chair time still pending.   CSW initiated SNF auth request in online portal. Ref# [id number]. Shara is pending.

## 2024-08-22 NOTE — Progress Notes (Signed)
 Physical Therapy Treatment Patient Details Name: Thomas [name] MRN: [id number] DOB: 02/12/1951 Today's Date: 08/22/2024   History of Present Illness 73 y.o. male came to ED with  complaints of Back Pain, weakness, missed HD.  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 Worked on standing with prosthetic. Pt needs prosthetic refitted due to wt loss. Did not attempt amb with 1 person assist. Pt also needs shoe/post op shoe for lt foot to even up leg length. Patient will benefit from continued inpatient follow up therapy, <3 hours/day     If plan is discharge home, recommend the following: Two people to help with walking and/or transfers;Two people to help with bathing/dressing/bathroom;Help with stairs or ramp for entrance;Assist for transportation;Assistance with cooking/housework   Can travel by private vehicle     No  Equipment Recommendations  Wheelchair (measurements PT);Wheelchair cushion (measurements PT)    Recommendations for Other Services       Precautions / Restrictions Precautions Precautions: Fall Recall of Precautions/Restrictions: Intact Precaution/Restrictions Comments: L TMA, R BKA Restrictions Weight Bearing Restrictions Per Provider Order: No Other Position/Activity Restrictions: Has prosthesis for R, but states it no longer fits right as he has lost a lot of weight.     Mobility  Bed Mobility Overal bed mobility: Needs Assistance Bed Mobility: Rolling, Sidelying to Sit, Sit to Supine Rolling: Min assist Sidelying to sit: Mod assist   Sit to supine: Supervision   General bed mobility comments: Assist to elevate trunk into sitting    Transfers Overall transfer level: Needs assistance Equipment used: Rolling walker (2 wheels) Transfers: Sit to/from Stand Sit to Stand: Min assist, From elevated surface            General transfer comment: Assist to power up and stabilize    Ambulation/Gait                   Stairs             Wheelchair Mobility     Tilt Bed    Modified Rankin (Stroke Patients Only)       Balance Overall balance assessment: Needs assistance Sitting-balance support: Feet supported, No upper extremity supported Sitting balance-[name]: Good     Standing balance support: Bilateral upper extremity supported, Reliant on assistive device for balance, During functional activity Standing balance-[name]: Poor Standing balance comment: Rolling walker and min assist for static standing. Posterior legs against bed.                            Communication Communication Communication: No apparent difficulties  Cognition Arousal: Alert Behavior During Therapy: WFL for tasks assessed/performed   PT - Cognitive impairments: Attention                       PT - Cognition Comments: Hard to keep focused on task. Tangential speech. Following commands: Intact      Cueing Cueing Techniques: Verbal cues, Gestural cues  Exercises General Exercises - Upper Extremity Elbow Extension: Strengthening, Both, 10 reps, Supine, Theraband (blue)    General Comments        Pertinent Vitals/Pain Pain Assessment Pain Assessment: Faces Faces Pain Scale: Hurts even more Pain Location: Upper thoracic area and neck Pain Descriptors / Indicators: Grimacing, Discomfort Pain Intervention(s): Limited activity  within patient's tolerance, Monitored during session, Repositioned    Home Living                          Prior Function            PT Goals (current goals can now be found in the care plan section) Progress towards PT goals: Progressing toward goals    Frequency    Min 1X/week      PT Plan      Co-evaluation              AM-PAC PT 6 Clicks Mobility   Outcome Measure  Help needed turning from your back  to your side while in a flat bed without using bedrails?: A Little Help needed moving from lying on your back to sitting on the side of a flat bed without using bedrails?: A Lot Help needed moving to and from a bed to a chair (including a wheelchair)?: A Lot Help needed standing up from a chair using your arms (e.g., wheelchair or bedside chair)?: A Lot Help needed to walk in hospital room?: Total Help needed climbing 3-5 steps with a railing? : Total 6 Click Score: 11    End of Session Equipment Utilized During Treatment: Gait belt Activity Tolerance: Patient tolerated treatment well Patient left: in bed;with call bell/phone within reach Nurse Communication: Mobility status PT Visit Diagnosis: Other abnormalities of gait and mobility (R26.89);Muscle weakness (generalized) (M62.81);History of falling (Z91.81)     Time: 8547-8474 PT Time Calculation (min) (ACUTE ONLY): 33 min  Charges:    $Therapeutic Activity: 23-37 mins PT General Charges $$ ACUTE PT VISIT: 1 Visit                     [facility] PT Acute Rehabilitation Services Office [phone]    [place]gers ORN [facility] 08/22/2024, 3:43 PM

## 2024-08-23 LAB — CBC
HCT: 27.7 % — ABNORMAL LOW (ref 39.0–52.0)
Hemoglobin: 9 g/dL — ABNORMAL LOW (ref 13.0–17.0)
MCH: 33 pg (ref 26.0–34.0)
MCHC: 32.5 g/dL (ref 30.0–36.0)
MCV: 101.5 fL — ABNORMAL HIGH (ref 80.0–100.0)
Platelets: 132 K/uL — ABNORMAL LOW (ref 150–400)
RBC: 2.73 MIL/uL — ABNORMAL LOW (ref 4.22–5.81)
RDW: 15.2 % (ref 11.5–15.5)
WBC: 4.2 K/uL (ref 4.0–10.5)
nRBC: 0 % (ref 0.0–0.2)

## 2024-08-23 LAB — RENAL FUNCTION PANEL
Albumin: 2.9 g/dL — ABNORMAL LOW (ref 3.5–5.0)
Anion gap: 14 (ref 5–15)
BUN: 51 mg/dL — ABNORMAL HIGH (ref 8–23)
CO2: 23 mmol/L (ref 22–32)
Calcium: 8.1 mg/dL — ABNORMAL LOW (ref 8.9–10.3)
Chloride: 98 mmol/L (ref 98–111)
Creatinine, Ser: 8.76 mg/dL — ABNORMAL HIGH (ref 0.61–1.24)
GFR, Estimated: 6 mL/min — ABNORMAL LOW (ref >60)
Glucose, Bld: 91 mg/dL (ref 70–99)
Phosphorus: 6.9 mg/dL — ABNORMAL HIGH (ref 2.5–4.6)
Potassium: 5 mmol/L (ref 3.5–5.1)
Sodium: 135 mmol/L (ref 135–145)

## 2024-08-23 LAB — GLUCOSE, CAPILLARY: Glucose-Capillary: 108 mg/dL — ABNORMAL HIGH (ref 70–99)

## 2024-08-23 MED ORDER — HEPARIN SODIUM (PORCINE) 1000 UNIT/ML IJ SOLN
INTRAMUSCULAR | Status: AC
  Filled 2024-08-23: qty 7

## 2024-08-23 MED ORDER — OXYCODONE-ACETAMINOPHEN 5-325 MG PO TABS: 1.0000 | ORAL_TABLET | Freq: Four times a day (QID) | ORAL | 0 refills | Status: DC | PRN

## 2024-08-23 NOTE — ED Notes (Signed)
 Attempted to assess pts skin on leg. Pt stated can't you leave me alone and do this when I wake up.

## 2024-08-23 NOTE — ED Notes (Signed)
 Pt stated the eggs in his breakfast made him nauseous.  Requested something for nausea.  Gave ODT zofran .    PT has been transported to Dialysis.

## 2024-08-23 NOTE — Progress Notes (Signed)
 Advised by CSW that pt will d/c to snf today. Contacted [facility] to be advised of pt's d/c today and that pt should start on Thursday as planned. Clinic provided snf name and pt's HD arrangements added to pt's AVS.   Randine Mungo Dialysis Navigator [phone]

## 2024-08-23 NOTE — ED Provider Notes (Signed)
 Emergency Medicine Observation Re-evaluation Note  Thomas [name] is a 73 y.o. male, seen on rounds today.  Pt initially presented to the ED for complaints of Back Pain Currently, the patient is placement.  Physical Exam  BP 126/87 (BP Location: Right Arm)   Pulse 78   Temp 98.2 F (36.8 C) (Oral)   Resp 16   Wt 90.7 kg   SpO2 100%   BMI 23.71 kg/m  Physical Exam General: Nontoxic no acute distress Cardiac:  Lungs: No respiratory distress Psych: Baseline  ED Course / MDM  EKG:EKG Interpretation Date/Time:  Saturday August 13 2024 15:24:02 EDT Ventricular Rate:  67 PR Interval:  205 QRS Duration:  102 QT Interval:  475 QTC Calculation: 502 R Axis:   -8  Text Interpretation: Sinus rhythm No significant change since last tracing Prolonged QT interval Confirmed by Garrick Charleston [phone]) on 08/13/2024 4:24:11 PM  I have reviewed the labs performed to date as well as medications administered while in observation.  Recent changes in the last 24 hours include being evaluated for nursing home placement.  Just got report that they do have placement for him at [facility].  Will discharge.  Plan  Current plan is for excepted to [facility] will discharge.    Denis Koppel, MD 08/23/24 [phone]

## 2024-08-23 NOTE — ED Notes (Addendum)
 This RN spoke with nurse from [facility] and was informed pt would not be able to arrive to their facility tonight as there is no intake nurse there until after 8am. Charge nurse made aware, PTAR cancelled

## 2024-08-23 NOTE — Progress Notes (Signed)
 SNF auth approved for [facility]  11/3- 11/5. Auth ID# [id number]. [facility] ready to admit pt to room 130A. RN to arrange [facility] transport and call report to [phone]. CSW will send AVS to [facility] once available.

## 2024-08-23 NOTE — ED Notes (Signed)
 Pt repositioned in bed for comfort.

## 2024-08-23 NOTE — ED Notes (Signed)
 Pt transported back to ED from dialysis. Pt responsive to voice, oriented to self. Following commands

## 2024-08-23 NOTE — Procedures (Signed)
 HD Note:  Some information was entered later than the data was gathered due to patient care needs. The stated time with the data is accurate.  Received patient in bed to unit.   Alert and oriented.   Informed consent signed and in chart.   Access used: upper right chest HD catheter Access issues: No issues  Patient had multiple complaints of nausea and hunger.  Patient stated that he wanted to eat, couldn't eat, and ate.  Unable to meet this need.  No emesis.  Machine issues created delays in completing treatment.  TX duration: 3.5 hours  Alert, without acute distress.  Total UF removed: 1000 ml  Hand-off given to patient's nurse.   Transported back to the room   Jelina Paulsen L. Lenon, RN Kidney Dialysis Unit.

## 2024-08-23 NOTE — Discharge Instructions (Signed)
 Patient is been accepted to [facility] facility.

## 2024-08-23 NOTE — Progress Notes (Signed)
 [facility] Progress Note  Subjective:  Seen on HD.  He is an ER patient awaiting SNF.  Per charting, patient is being transferred to [facility] for HD.   Seen and examined on dialysis.  Procedure supervised.  Blood pressure 126/78 and HR 80.  Tolerating goal.  Right internal jugular tunn catheter in use.     Review of systems:    Denies shortness of breath or chest pain Denies n/v  Vitals:   08/23/24 0805 08/23/24 0813 08/23/24 0827 08/23/24 0845  BP: (!) 141/73 (!) 141/73 (!) 157/83 132/87  Pulse: 67 (!) 36 65 66  Resp: 17 (!) 9 15 16   Temp: 98.1 F (36.7 C)     TempSrc:      SpO2: 98% 100% 100% 97%  Weight: 88.3 kg       Physical Exam:    General adult male in bed in no acute distress HEENT normocephalic atraumatic extraocular movements intact sclera anicteric Neck supple trachea midline Lungs clear to auscultation bilaterally normal work of breathing at rest  Heart S1S2 no rub Abdomen soft nontender nondistended Extremities trace edema left leg and right BKA Psych normal mood and affect Neuro - alert and oriented x 3 provides hx and follows commands  Access RIJ tunn catheter    Home bp meds: Clonidine  0.1mg  tid Metoprolol  XL 100mg  every day Midodrine  5mg  tid      OP HD: T[facility] Aug  2025 -> 3.5h  2K bath  92kg  Heparin  none  TDC     Assessment/ Plan: Gen'd weakness: needs SNF placement. Boarding in ED at [facility] awaiting same ESRD: on HD TTS. Next HD 11/04. He will be at [facility] TTS while at [facility] per charting.   HTN: per charting, started home clonidine  and toprol  XL, then bp's dropped.  The toprol  xl was dc'd 10/29. Per charting, was kept even w/ HD Sat but still dropped BP's on HD. Clonidine  was dc'd but then hypertensive again.   Clonidine  was resumed at 0.1mg  tid w/ hold parameters for SBP < 125.    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  Dispo: awaiting SNF placement, boarding in ER until  same    Recent Labs  Lab 08/20/24 1015 08/23/24 0634  HGB 9.2* 9.0*  ALBUMIN  3.2* 2.9*  CALCIUM  7.8* 8.1*  PHOS 7.3* 6.9*  CREATININE 8.63* 8.76*  K 5.4* 5.0   No results for input(s): IRON , TIBC, FERRITIN in the last 168 hours. Inpatient medications:  apixaban   5 mg Oral BID   atorvastatin   80 mg Oral QHS   Chlorhexidine  Gluconate Cloth  6 each Topical Q0600   cholecalciferol   5,000 Units Oral Daily   cloNIDine   0.1 mg Oral TID   cyanocobalamin   1,000 mcg Oral Daily   lactulose   30 g Oral Daily   levothyroxine   50 mcg Oral QAC breakfast   oxyCODONE   30 mg Oral Q12H   tamsulosin   0.4 mg Oral Daily      acetaminophen , bisacodyl , methocarbamol , ondansetron , oxyCODONE     Katheryn JAYSON Saba, MD 10:01 AM 08/23/2024

## 2024-08-24 LAB — CBG MONITORING, ED: Glucose-Capillary: 83 mg/dL (ref 70–99)

## 2024-08-24 NOTE — ED Notes (Signed)
 Pt given crackers with peanut butter and grapes per request.

## 2024-08-24 NOTE — Progress Notes (Signed)
 Contacted [facility] to be advised pt will d/c today to snf per CSW and pt should start tomorrow as planned.   Randine Mungo Dialysis Navigator [phone]

## 2024-09-07 ENCOUNTER — Emergency Department (HOSPITAL_COMMUNITY)
Admission: EM | Admit: 2024-09-07 | Discharge: 2024-09-08 | Disposition: A | Discharge status: Skilled Nursing Facility | Attending: Emergency Medicine | Admitting: Emergency Medicine

## 2024-09-07 ENCOUNTER — Encounter (HOSPITAL_COMMUNITY): Payer: Self-pay

## 2024-09-07 ENCOUNTER — Other Ambulatory Visit: Payer: Self-pay

## 2024-09-07 DIAGNOSIS — N186 End stage renal disease: Secondary | ICD-10-CM | POA: Diagnosis not present

## 2024-09-07 DIAGNOSIS — I120 Hypertensive chronic kidney disease with stage 5 chronic kidney disease or end stage renal disease: Secondary | ICD-10-CM | POA: Diagnosis not present

## 2024-09-07 DIAGNOSIS — Z992 Dependence on renal dialysis: Secondary | ICD-10-CM | POA: Diagnosis not present

## 2024-09-07 DIAGNOSIS — H538 Other visual disturbances: Secondary | ICD-10-CM | POA: Diagnosis present

## 2024-09-07 DIAGNOSIS — H539 Unspecified visual disturbance: Secondary | ICD-10-CM

## 2024-09-07 DIAGNOSIS — E1122 Type 2 diabetes mellitus with diabetic chronic kidney disease: Secondary | ICD-10-CM | POA: Diagnosis not present

## 2024-09-07 LAB — CBC WITH DIFFERENTIAL/PLATELET
Abs Immature Granulocytes: 0.03 K/uL (ref 0.00–0.07)
Basophils Absolute: 0 K/uL (ref 0.0–0.1)
Basophils Relative: 0 %
Eosinophils Absolute: 0.3 K/uL (ref 0.0–0.5)
Eosinophils Relative: 5 %
HCT: 28.1 % — ABNORMAL LOW (ref 39.0–52.0)
Hemoglobin: 8.9 g/dL — ABNORMAL LOW (ref 13.0–17.0)
Immature Granulocytes: 1 %
Lymphocytes Relative: 21 %
Lymphs Abs: 1.3 K/uL (ref 0.7–4.0)
MCH: 33.2 pg (ref 26.0–34.0)
MCHC: 31.7 g/dL (ref 30.0–36.0)
MCV: 104.9 fL — ABNORMAL HIGH (ref 80.0–100.0)
Monocytes Absolute: 0.6 K/uL (ref 0.1–1.0)
Monocytes Relative: 9 %
Neutro Abs: 3.9 K/uL (ref 1.7–7.7)
Neutrophils Relative %: 64 %
Platelets: 159 K/uL (ref 150–400)
RBC: 2.68 MIL/uL — ABNORMAL LOW (ref 4.22–5.81)
RDW: 14.5 % (ref 11.5–15.5)
WBC: 6.1 K/uL (ref 4.0–10.5)
nRBC: 0 % (ref 0.0–0.2)

## 2024-09-07 LAB — COMPREHENSIVE METABOLIC PANEL WITH GFR
ALT: 10 U/L (ref 0–44)
AST: 18 U/L (ref 15–41)
Albumin: 3 g/dL — ABNORMAL LOW (ref 3.5–5.0)
Alkaline Phosphatase: 54 U/L (ref 38–126)
Anion gap: 14 (ref 5–15)
BUN: 55 mg/dL — ABNORMAL HIGH (ref 8–23)
CO2: 19 mmol/L — ABNORMAL LOW (ref 22–32)
Calcium: 8.2 mg/dL — ABNORMAL LOW (ref 8.9–10.3)
Chloride: 106 mmol/L (ref 98–111)
Creatinine, Ser: 10.63 mg/dL — ABNORMAL HIGH (ref 0.61–1.24)
GFR, Estimated: 5 mL/min — ABNORMAL LOW (ref >60)
Glucose, Bld: 72 mg/dL (ref 70–99)
Potassium: 6.6 mmol/L (ref 3.5–5.1)
Sodium: 139 mmol/L (ref 135–145)
Total Bilirubin: 0.7 mg/dL (ref 0.0–1.2)
Total Protein: 7.4 g/dL (ref 6.5–8.1)

## 2024-09-07 LAB — MAGNESIUM: Magnesium: 2.3 mg/dL (ref 1.7–2.4)

## 2024-09-07 LAB — LIPASE, BLOOD: Lipase: 39 U/L (ref 11–51)

## 2024-09-07 MED ORDER — SODIUM BICARBONATE 8.4 % IV SOLN
50.0000 meq | Freq: Once | INTRAVENOUS | Status: AC
  Administered 2024-09-07: 50 meq via INTRAVENOUS
  Filled 2024-09-07: qty 50

## 2024-09-07 MED ORDER — ALBUTEROL SULFATE (2.5 MG/3ML) 0.083% IN NEBU
10.0000 mg | INHALATION_SOLUTION | Freq: Once | RESPIRATORY_TRACT | Status: DC
  Filled 2024-09-07: qty 12

## 2024-09-07 MED ORDER — SODIUM ZIRCONIUM CYCLOSILICATE 10 G PO PACK
10.0000 g | PACK | Freq: Once | ORAL | Status: DC
  Filled 2024-09-07: qty 1

## 2024-09-07 MED ORDER — CALCIUM GLUCONATE-NACL 1-0.675 GM/50ML-% IV SOLN
1.0000 g | Freq: Once | INTRAVENOUS | Status: AC
  Administered 2024-09-07: 1000 mg via INTRAVENOUS
  Filled 2024-09-07: qty 50

## 2024-09-07 MED ORDER — CHLORHEXIDINE GLUCONATE CLOTH 2 % EX PADS: 6.0000 | MEDICATED_PAD | Freq: Every day | CUTANEOUS | Status: DC

## 2024-09-07 MED ORDER — GLYCERIN (LAXATIVE) 2 G RE SUPP
1.0000 | RECTAL | Status: DC | PRN
  Administered 2024-09-07: 1 via RECTAL
  Filled 2024-09-07: qty 1

## 2024-09-07 NOTE — ED Triage Notes (Addendum)
 Pt to er via ems, per ems pt is here because he has elected to skip his last two dialysis treatments and he is having general body aches. Pt states that he is here because the vision in his L eye is blurry, states that it has been like this for the past few days

## 2024-09-07 NOTE — ED Provider Notes (Signed)
 Emergency Department Provider Note   I have reviewed the triage vital signs and the nursing notes.   HISTORY  Chief Complaint Generalized Body Aches   HPI KEY CEN is a 73 y.o. male with past history of diabetes, ESRD, hypertension presents emergency department with blurry vision to the left eye.  2 days ago he noticed blurry vision.  He is able to make out fingers and color without difficulty but notices when he is trying to read the words are only clear if he closes his left eye.  He is not having eye pain in particular.  Since some recent surgery he has had burning discomfort around both eyes but no specific left eye tenderness.  No injuries.  He does not wear contact lenses.  He does not follow with an ophthalmologist regularly.  He missed dialysis yesterday due to not having good close to where but otherwise is feeling well.  No chest pain, palpitations, shortness of breath.  No headache.    Past Medical History:  Diagnosis Date   Arthritis    BPH (benign prostatic hyperplasia) 11/24/2019   Chronic pain syndrome 11/24/2019   Diabetes mellitus without complication (HCC)    ESRD (end stage renal disease) (HCC)    on HD   Gout    Hypertension    Muscle pain    Physical deconditioning 11/24/2019   Scars    Swelling     Review of Systems  Constitutional: No fever/chills Eyes: Positive left eye visual changes. ENT: No sore throat. Cardiovascular: Denies chest pain. Respiratory: Denies shortness of breath. Gastrointestinal: No abdominal pain.  No nausea, no vomiting.   Skin: Negative for rash. Neurological: Negative for headaches, focal weakness or numbness.   ____________________________________________   PHYSICAL EXAM:  VITAL SIGNS: ED Triage Vitals  Encounter Vitals Group     BP 09/07/24 1544 (!) 159/105     Pulse Rate 09/07/24 1544 60     Resp 09/07/24 1544 17     Temp 09/07/24 1544 98 F (36.7 C)     Temp src --      SpO2 09/07/24 1544 97 %      Weight 09/07/24 1539 185 lb (83.9 kg)     Height 09/07/24 1539 5' 11 (1.803 m)   Constitutional: Alert and oriented. Well appearing and in no acute distress. Eyes: Conjunctivae are normal. EOMI. PERRL.  Head: Atraumatic. Nose: No congestion/rhinnorhea. Mouth/Throat: Mucous membranes are moist.   Neck: No stridor.   Cardiovascular: Normal rate, regular rhythm. Good peripheral circulation. Grossly normal heart sounds.   Respiratory: Normal respiratory effort.  No retractions. Lungs CTAB. Gastrointestinal: Soft with mild epigastric tenderness. No distention.  Musculoskeletal: No gross deformities of extremities. Neurologic:  Normal speech and language. No gross focal neurologic deficits are appreciated.  Skin:  Skin is warm, dry and intact. No rash noted.   ____________________________________________   LABS (all labs ordered are listed, but only abnormal results are displayed)  Labs Reviewed  COMPREHENSIVE METABOLIC PANEL WITH GFR - Abnormal; Notable for the following components:      Result Value   Potassium 6.6 (*)    CO2 19 (*)    BUN 55 (*)    Creatinine, Ser 10.63 (*)    Calcium  8.2 (*)    Albumin  3.0 (*)    GFR, Estimated 5 (*)    All other components within normal limits  CBC WITH DIFFERENTIAL/PLATELET - Abnormal; Notable for the following components:   RBC 2.68 (*)  Hemoglobin 8.9 (*)    HCT 28.1 (*)    MCV 104.9 (*)    All other components within normal limits  LIPASE, BLOOD  MAGNESIUM   HEPATITIS B SURFACE ANTIGEN  HEPATITIS B SURFACE ANTIBODY, QUANTITATIVE   ____________________________________________  EKG   EKG Interpretation Date/Time:  Wednesday September 07 2024 18:43:07 EST Ventricular Rate:  75 PR Interval:  220 QRS Duration:  93 QT Interval:  418 QTC Calculation: 467 R Axis:   11  Text Interpretation: Sinus rhythm Prolonged PR interval Confirmed by Darra Chew [phone]) on 09/07/2024 6:56:36 PM         ____________________________________________   PROCEDURES  Procedure(s) performed:   Procedures  CRITICAL CARE Performed by: Chew KANDICE Darra Total critical care time: 35 minutes Critical care time was exclusive of separately billable procedures and treating other patients. Critical care was necessary to treat or prevent imminent or life-threatening deterioration. Critical care was time spent personally by me on the following activities: development of treatment plan with patient and/or surrogate as well as nursing, discussions with consultants, evaluation of patient's response to treatment, examination of patient, obtaining history from patient or surrogate, ordering and performing treatments and interventions, ordering and review of laboratory studies, ordering and review of radiographic studies, pulse oximetry and re-evaluation of patient's condition.  Chew Darra, MD Emergency Medicine  ____________________________________________   INITIAL IMPRESSION / ASSESSMENT AND PLAN / ED COURSE  Pertinent labs & imaging results that were available during my care of the patient were reviewed by me and considered in my medical decision making (see chart for details).   This patient is Presenting for Evaluation of vision change, which does require a range of treatment options, and is a complaint that involves a high risk of morbidity and mortality.  The Differential Diagnoses include CRAO, venous occlusion, electrolyte disturbance, etc.  Critical Interventions-    Medications  Glycerin  (Adult) 2 g suppository 1 suppository (1 suppository Rectal Given 09/07/24 1730)  sodium zirconium cyclosilicate  (LOKELMA ) packet 10 g (10 g Oral Patient Refused/Not Given 09/07/24 2038)  albuterol  (PROVENTIL ) (2.5 MG/3ML) 0.083% nebulizer solution 10 mg (10 mg Nebulization Patient Refused/Not Given 09/07/24 2039)  Chlorhexidine  Gluconate Cloth 2 % PADS 6 each (has no administration in time range)   calcium  gluconate 1 g/ 50 mL sodium chloride  IVPB (0 mg Intravenous Stopped 09/07/24 2104)  sodium bicarbonate  injection 50 mEq (50 mEq Intravenous Given 09/07/24 2040)    Reassessment after intervention: no worsening symptoms.   Clinical Laboratory Tests Ordered, included CBC with mild anemia at 8.9. K elevated to 6.6.   Cardiac Monitor Tracing which shows NSR.    Social Determinants of Health Risk patient is a non-smoker.   Consult complete with Nephrology, Dr. Gearline. Will add on for HD from the ED.   Dr. Octavia with ophthalmology. Plan to see in the office tomorrow.   Medical Decision Making: Summary:  Patient presents emergency department with vision change on the left.  No hemianopsia.  Lower suspicion for central process.  I discussed the case with Dr. Octavia by phone.  Symptoms have been going on now for 2 days and so patient can follow-up with their office tomorrow for evaluation and he can help to triage at that point.   Reevaluation with update and discussion with patient.  His potassium is elevated, likely due to missing dialysis recently.  Plan to coordinate dialysis from the ED prior to discharge.  Considered admission but patient is safe for discharge after HD with ophthalmology follow up.  Patient's presentation is most consistent with acute presentation with potential threat to life or bodily function.   Disposition: HD  ____________________________________________  FINAL CLINICAL IMPRESSION(S) / ED DIAGNOSES  Final diagnoses:  Changes in vision    Note:  This document was prepared using Dragon voice recognition software and may include unintentional dictation errors.  Fonda Law, MD, [facility] Emergency Medicine    Pace Lamadrid, Fonda MATSU, MD 09/07/24 [phone]

## 2024-09-07 NOTE — Progress Notes (Signed)
 Brief Note:  Called with pt well-known to our service.  ESRD on HD, nonadherence, recent SNF placement.  Missed last 2 HD rx, last was Thursday.    TTS at [facility] of 6.6.  Came in with vision changes of eye, planned for OP followup.    In this setting we are asked to see.  Will go ahead and dialyze here.  Orders written, HD RN aware.  Anticipate discharge per EDP after completion of dialysis.    Almarie Bonine MD [employer] [facility] [phone]

## 2024-09-07 NOTE — ED Notes (Signed)
 CRITICAL VALUE STICKER  CRITICAL VALUE: K+ 6.6  RECEIVER (on-site recipient of call): CHANETA Sar RN  DATE & TIME NOTIFIED: 09/07/24 1829  MESSENGER (representative from lab):Lab  MD NOTIFIED: Long  TIME OF NOTIFICATION:1829  RESPONSE:

## 2024-09-07 NOTE — Discharge Instructions (Signed)
 Please call the eye doctor listed for an appointment in the office tomorrow.  They are aware of your case and we will get you in for an appointment.  They can do of more formal eye exam at that time and decide what needs to be done moving forward.

## 2024-09-07 NOTE — ED Notes (Signed)
 Pt to dialysis.

## 2024-09-08 MED ORDER — PENTAFLUOROPROP-TETRAFLUOROETH EX AERO: 1.0000 | INHALATION_SPRAY | CUTANEOUS | Status: DC | PRN

## 2024-09-08 MED ORDER — HEPARIN SODIUM (PORCINE) 1000 UNIT/ML DIALYSIS
1000.0000 [IU] | INTRAMUSCULAR | Status: DC | PRN
  Administered 2024-09-07: 1000 [IU] via INTRAVENOUS_CENTRAL

## 2024-09-08 MED ORDER — HEPARIN SODIUM (PORCINE) 1000 UNIT/ML IJ SOLN
INTRAMUSCULAR | Status: AC
Start: 2024-09-08 — End: 2024-09-08
  Filled 2024-09-08: qty 4

## 2024-09-08 MED ORDER — HEPARIN SODIUM (PORCINE) 1000 UNIT/ML IJ SOLN
INTRAMUSCULAR | Status: AC
  Filled 2024-09-08: qty 3

## 2024-09-08 MED ORDER — LIDOCAINE-PRILOCAINE 2.5-2.5 % EX CREA: 1.0000 | TOPICAL_CREAM | CUTANEOUS | Status: DC | PRN

## 2024-09-08 MED ORDER — LIDOCAINE HCL (PF) 1 % IJ SOLN: 5.0000 mL | INTRAMUSCULAR | Status: DC | PRN

## 2024-09-08 MED ORDER — ALTEPLASE 2 MG IJ SOLR: 2.0000 mg | Freq: Once | INTRAMUSCULAR | Status: DC | PRN

## 2024-09-08 MED ORDER — HEPARIN SODIUM (PORCINE) 1000 UNIT/ML DIALYSIS
1000.0000 [IU] | INTRAMUSCULAR | Status: DC | PRN
  Administered 2024-09-08: 1000 [IU]

## 2024-09-08 MED ORDER — ANTICOAGULANT SODIUM CITRATE 4% (200MG/5ML) IV SOLN: 5.0000 mL | Status: DC | PRN

## 2024-09-08 NOTE — ED Provider Notes (Signed)
 Returns from dialysis.  Patient appears well.  He apparently resides at a nursing home currently.  The patient's nurse did contact the nursing home and was informed that they would not be able to facilitate him getting to the eye doctor today.  I did review the note from Dr. Darra, patient with acute vision change in the left eye that occurred several days ago.  He did not require emergent follow-up at that time because of the chronicity but does require follow-up in Dr. Octavia is willing to see him in the office today.  Patient will be held until morning, at which time he may be possible to arrange for the patient to get to the eye doctor and then back to the nursing home after evaluation.   Haze Lonni PARAS, MD 09/08/24 [phone]

## 2024-09-08 NOTE — ED Notes (Signed)
 PATIENT IS FROM [facility] NOT [facility]

## 2024-09-08 NOTE — ED Notes (Signed)
 Called Dr Octavia office will fit him in anytime before 3pm.

## 2024-09-08 NOTE — ED Notes (Signed)
 [facility] called 8:14; no one ahead, eta 45 min

## 2024-09-08 NOTE — ED Notes (Signed)
 [facility] is going to arrange transport to Dr Jens office.

## 2024-09-08 NOTE — Progress Notes (Signed)
 Received patient in bed to unit.  Alert and oriented.  Informed consent signed and in chart.   TX duration: 3.5  Patient tolerated well. ED Transported back to the room  Alert, without acute distress.  Hand-off given to patient's nurse. ED RN  Access used: dialysis catheter Access issues: None  Total UF removed: 1500 Medication(s) given: None Post HD VS: T98.9-HR101-RR19 B/p146/77 Post HD weight: 82.4kg  Neville Seip, RN [facility]    09/08/24 0300  Vitals  Temp 98.9 F (37.2 C)  Temp Source Oral  BP (!) 146/77  MAP (mmHg) 92  BP Location Right Arm  BP Method Automatic  Patient Position (if appropriate) Lying  Pulse Rate (!) 101  Pulse Rate Source Monitor  ECG Heart Rate 98  Resp 19  Weight 82.4 kg  Type of Weight Post-Dialysis  Oxygen Therapy  SpO2 100 %  O2 Device Room Air  During Treatment Monitoring  Blood Flow Rate (mL/min) 0 mL/min  Arterial Pressure (mmHg) -1.41 mmHg  Venous Pressure (mmHg) -2.63 mmHg  TMP (mmHg) -53.73 mmHg  Ultrafiltration Rate (mL/min) 461 mL/min  Dialysate Flow Rate (mL/min) 300 ml/min  Dialysate Potassium Concentration 2  Dialysate Calcium  Concentration 2.5  Duration of HD Treatment -hour(s) 3.5 hour(s)  Cumulative Fluid Removed (mL) per Treatment  1500.3  HD Safety Checks Performed Yes  Intra-Hemodialysis Comments Tolerated well;Tx completed  Post Treatment  Dialyzer Clearance Lightly streaked  Liters Processed 84  Fluid Removed (mL) 1500 mL  Tolerated HD Treatment Yes  Hemodialysis Catheter Right Internal jugular Double lumen Permanent (Tunneled)  Placement Date/Time: 07/22/24 1221   Placed prior to admission: No  Serial / Lot #: [id number]  Expiration Date: 05/31/28  Time Out: Correct patient;Correct site;Correct procedure  Maximum sterile barrier precautions: Hand hygiene;Cap;Mask;Sterile gow...  Site Condition No complications  Blue Lumen Status Flushed;Antimicrobial dead end cap;Heparin  locked  Red Lumen  Status Flushed;Antimicrobial dead end cap;Heparin  locked  Purple Lumen Status N/A  Catheter fill solution Heparin  1000 units/ml  Catheter fill volume (Arterial) 1.6 cc  Catheter fill volume (Venous) 1.6  Dressing Type Transparent  Dressing Status Antimicrobial disc/dressing in place;Clean, Dry, Intact  Interventions Dressing changed;New dressing  Drainage Description None  Dressing Change Due 09/14/24  Post treatment catheter status Capped and Clamped

## 2024-09-08 NOTE — ED Notes (Signed)
 CCMD called.

## 2024-09-16 ENCOUNTER — Emergency Department (HOSPITAL_COMMUNITY)
Admission: EM | Admit: 2024-09-16 | Discharge: 2024-09-16 | Disposition: A | Discharge status: Home / Self Care | Attending: Emergency Medicine | Admitting: Emergency Medicine

## 2024-09-16 ENCOUNTER — Emergency Department (HOSPITAL_COMMUNITY)

## 2024-09-16 ENCOUNTER — Other Ambulatory Visit: Payer: Self-pay

## 2024-09-16 DIAGNOSIS — E1122 Type 2 diabetes mellitus with diabetic chronic kidney disease: Secondary | ICD-10-CM | POA: Insufficient documentation

## 2024-09-16 DIAGNOSIS — Z7901 Long term (current) use of anticoagulants: Secondary | ICD-10-CM | POA: Insufficient documentation

## 2024-09-16 DIAGNOSIS — Z992 Dependence on renal dialysis: Secondary | ICD-10-CM | POA: Diagnosis not present

## 2024-09-16 DIAGNOSIS — N186 End stage renal disease: Secondary | ICD-10-CM | POA: Diagnosis not present

## 2024-09-16 DIAGNOSIS — I509 Heart failure, unspecified: Secondary | ICD-10-CM | POA: Insufficient documentation

## 2024-09-16 DIAGNOSIS — R6884 Jaw pain: Secondary | ICD-10-CM | POA: Diagnosis present

## 2024-09-16 MED ORDER — ACETAMINOPHEN 325 MG PO TABS
650.0000 mg | ORAL_TABLET | Freq: Once | ORAL | Status: DC
  Filled 2024-09-16: qty 2

## 2024-09-16 NOTE — ED Triage Notes (Signed)
 Pt BIB [facility] from [facility]. Pt reports going to sleep fine and waking up to a dislocated jaw. Pt has Hx of jaw issues ranging back to the 80s.  162/90 70HR 98% RA

## 2024-09-16 NOTE — ED Provider Notes (Signed)
 [place] EMERGENCY DEPARTMENT AT [facility] Provider Note   CSN: [id number] Arrival date & time: 09/16/24  9395     Patient presents with: No chief complaint on file.   Thomas [name] is a 73 y.o. male.   Patient complains of pain in his jaw.  Patient reports that he had a previous fracture to his left jaw.  Patient states that he woke up with pain this a.m.  Patient is concerned that his jaw may be dislocated.  Patient denies any fever or chills he has had not had any redness or swelling he denies any oral issues.  Patient has a past medical history of chronic kidney disease pulmonary embolus diabetes congestive heart failure, osteomyelitis, DVT.  Patient is currently on dialysis.  The history is provided by the patient. No language interpreter was used.       Prior to Admission medications   Medication Sig Start Date End Date Taking? Authorizing Provider  apixaban  (ELIQUIS ) 5 MG TABS tablet Take 1 tablet (5 mg total) by mouth 2 (two) times daily. 03/15/24   Samtani, Jai-Gurmukh, MD  atorvastatin  (LIPITOR ) 80 MG tablet Take 80 mg by mouth at bedtime.    [provider]  bisacodyl  (DULCOLAX) 10 MG suppository Place 1 suppository (10 mg total) rectally daily as needed for moderate constipation. 03/15/24   Samtani, Jai-Gurmukh, MD  Cholecalciferol  125 MCG (5000 UT) capsule Take 5,000 Units by mouth daily.    [provider]  cyanocobalamin  (VITAMIN B12) 1000 MCG tablet Take 1 tablet by mouth daily.    [provider]  HYDROmorphone  (DILAUDID ) 4 MG tablet Take 4 mg by mouth See admin instructions. Give one tablet by mouth in the morning on Monday, Wednesdays and Fridays dialysis days only    [provider]  lactulose  (CHRONULAC ) 10 GM/15ML solution Take 45 mLs (30 g total) by mouth daily. 03/15/24   Samtani, Jai-Gurmukh, MD  levothyroxine  (SYNTHROID ) 50 MCG tablet Take 50 mcg by mouth daily before breakfast.     [provider]   methocarbamol  (ROBAXIN ) 500 MG tablet Take 500 mg by mouth every 6 (six) hours as needed for muscle spasms. 05/25/24   [provider]  metoprolol  succinate (TOPROL -XL) 100 MG 24 hr tablet Take 100 mg by mouth daily. Take with or immediately following a meal.    [provider]  midodrine  (PROAMATINE ) 5 MG tablet Take 1 tablet (5 mg total) by mouth 3 (three) times daily with meals. 03/15/24   Samtani, Jai-Gurmukh, MD  Nutritional Supplements (NEPRO PO) Take 240 mLs by mouth 2 (two) times daily.    [provider]  ondansetron  (ZOFRAN ) 4 MG tablet Take 4 mg by mouth every 4 (four) hours as needed for nausea or vomiting.    [provider]  oxyCODONE  (OXYCONTIN ) 20 mg 12 hr tablet Take 20 mg by mouth every 12 (twelve) hours.    [provider]  oxyCODONE -acetaminophen  (PERCOCET/ROXICET) 5-325 MG tablet Take 1 tablet by mouth every 6 (six) hours as needed for severe pain (pain score 7-10). 08/23/24   Zackowski, Scott, MD  REXULTI 1 MG TABS tablet Take 1 mg by mouth daily. 06/01/24   [provider]  senna-docusate (SENOKOT-S) 8.6-50 MG tablet Take 4 tablets by mouth 2 (two) times daily. 11/04/22   [provider]  sevelamer  (RENAGEL ) 800 MG tablet Take 1,600 mg by mouth 3 (three) times daily. 08/27/23   [provider]  sevelamer  carbonate (RENVELA ) 800 MG tablet Take 2,400  mg by mouth 3 (three) times daily with meals. 05/19/24   [provider]  sodium zirconium cyclosilicate  (LOKELMA ) 10 g PACK packet Take 10 g by mouth 2 (two) times daily. 03/30/24   Armenta Canning, MD  tamsulosin  (FLOMAX ) 0.4 MG CAPS capsule Take 1 capsule (0.4 mg total) by mouth daily. Patient taking differently: Take 0.4 mg by mouth every evening. 09/25/20   Arnett Saunders, MD  tiZANidine  (ZANAFLEX ) 4 MG tablet Take 4 mg by mouth every 8 (eight) hours as needed for muscle spasms. 11/26/21   [provider]  torsemide (DEMADEX) 20 MG tablet Take 20 mg  by mouth 2 (two) times daily.    [provider]  Vitamin E 670 MG (1000 UT) CAPS Take 1,000 mg by mouth daily.    [provider]    Allergies: Other, Oxymorphone, Amitriptyline, Aspirin, Beta vulgaris, Buspirone, Cabbage, Codeine, Cyclobenzaprine, Diflunisal, Fish allergy, Fish protein-containing drug products, Hydroxyzine, Levetiracetam, Methadone, Nalbuphine, Pentazocine, Propoxyphene, Shellfish allergy, Sulfa antibiotics, Sulfasalazine, and Amoxicillin     Review of Systems  All other systems reviewed and are negative.   Updated Vital Signs BP (!) 143/83 (BP Location: Right Arm)   Pulse (!) 55   Temp 97.7 F (36.5 C) (Oral)   Resp 16   SpO2 99%   Physical Exam Vitals and nursing note reviewed.  Constitutional:      Appearance: He is well-developed.  HENT:     Head: Normocephalic.     Comments: Tender left jaw, pt opens and closes mouth,     Nose: Nose normal.     Mouth/Throat:     Mouth: Mucous membranes are moist.  Cardiovascular:     Rate and Rhythm: Normal rate.  Pulmonary:     Effort: Pulmonary effort is normal.  Abdominal:     General: There is no distension.  Skin:    General: Skin is warm.  Neurological:     General: No focal deficit present.     Mental Status: He is alert and oriented to person, place, and time.  Psychiatric:        Mood and Affect: Mood normal.     (all labs ordered are listed, but only abnormal results are displayed) Labs Reviewed - No data to display  EKG: None  Radiology: CT Maxillofacial Wo Contrast Result Date: 09/16/2024 EXAM: CT OF THE FACE WITHOUT CONTRAST 09/16/2024 09:53:20 AM TECHNIQUE: CT of the face was performed without the administration of intravenous contrast. Multiplanar reformatted images are provided for review. Automated exposure control, iterative reconstruction, and/or weight based adjustment of the mA/kV was utilized to reduce the radiation dose to as low as reasonably achievable. COMPARISON:  None available. CLINICAL HISTORY: Facial trauma, blunt. FINDINGS: FACIAL BONES: The patient is edentulous. No acute facial fracture. No mandibular dislocation. No suspicious bone lesion. ORBITS: Globes are intact. No acute traumatic injury. No inflammatory change. SINUSES AND MASTOIDS: No acute abnormality. SOFT TISSUES: No acute abnormality. IMPRESSION: 1. No acute facial fracture. Electronically signed by: Evalene Coho MD 09/16/2024 10:06 AM EST RP Workstation: [id number]   DG Mandible 4 Views Result Date: 09/16/2024 CLINICAL DATA:  Pain.  Woke up with dislocated jaw. EXAM: MANDIBLE - 4+ VIEW COMPARISON:  Head CT 08/12/2024 FINDINGS: Patient is edentulous. No evidence for an acute mandible fracture. Limited evaluation of the temporomandibular joints. Right jugular dialysis catheter. IMPRESSION: No acute abnormality to the mandible. Limited evaluation of the temporomandibular joints. If there is concern for TMJ dislocation, recommend further characterization with CT. Electronically Signed  By: Juliene Balder M.D.   On: 09/16/2024 08:09     Procedures   Medications Ordered in the ED  acetaminophen  (TYLENOL ) tablet 650 mg (650 mg Oral Patient Refused/Not Given 09/16/24 0849)                                    Medical Decision Making Pt complains of pain in his left jaw.   Amount and/or Complexity of Data Reviewed Independent Historian:     Details: Pt sent to ED by care facility Radiology: ordered and independent interpretation performed. Decision-making details documented in ED Course.    Details: Xray mandible no fracture, radiologist advised ct scan Ct scan shows no evidence of dislocation or abnormality   Risk OTC drugs. Risk Details: Pt advised tylenol  for disomfort         Final diagnoses:  Pain in lower jaw    ED Discharge Orders     None     An After Visit Summary was printed and given to the patient.      Flint Sonny POUR, PA-C 09/16/24 1058    Suzette Pac, MD 09/16/24 [phone]

## 2024-09-16 NOTE — ED Notes (Signed)
 [facility] called

## 2024-09-16 NOTE — Discharge Instructions (Addendum)
Tylenol for discomfort

## 2024-09-22 ENCOUNTER — Other Ambulatory Visit: Payer: Self-pay

## 2024-09-22 ENCOUNTER — Emergency Department (HOSPITAL_COMMUNITY)

## 2024-09-22 ENCOUNTER — Emergency Department (HOSPITAL_COMMUNITY)
Admission: EM | Admit: 2024-09-22 | Discharge: 2024-09-22 | Disposition: A | Discharge status: Skilled Nursing Facility | Attending: Emergency Medicine | Admitting: Emergency Medicine

## 2024-09-22 DIAGNOSIS — Z992 Dependence on renal dialysis: Secondary | ICD-10-CM | POA: Insufficient documentation

## 2024-09-22 DIAGNOSIS — M5489 Other dorsalgia: Secondary | ICD-10-CM

## 2024-09-22 DIAGNOSIS — M549 Dorsalgia, unspecified: Secondary | ICD-10-CM | POA: Insufficient documentation

## 2024-09-22 DIAGNOSIS — G8929 Other chronic pain: Secondary | ICD-10-CM | POA: Insufficient documentation

## 2024-09-22 DIAGNOSIS — Z7901 Long term (current) use of anticoagulants: Secondary | ICD-10-CM | POA: Insufficient documentation

## 2024-09-22 DIAGNOSIS — D649 Anemia, unspecified: Secondary | ICD-10-CM | POA: Insufficient documentation

## 2024-09-22 DIAGNOSIS — N186 End stage renal disease: Secondary | ICD-10-CM | POA: Insufficient documentation

## 2024-09-22 DIAGNOSIS — R55 Syncope and collapse: Secondary | ICD-10-CM | POA: Diagnosis present

## 2024-09-22 LAB — CBC WITH DIFFERENTIAL/PLATELET
Abs Immature Granulocytes: 0.04 K/uL (ref 0.00–0.07)
Basophils Absolute: 0 K/uL (ref 0.0–0.1)
Basophils Relative: 0 %
Eosinophils Absolute: 0.2 K/uL (ref 0.0–0.5)
Eosinophils Relative: 3 %
HCT: 21.7 % — ABNORMAL LOW (ref 39.0–52.0)
Hemoglobin: 7.1 g/dL — ABNORMAL LOW (ref 13.0–17.0)
Immature Granulocytes: 1 %
Lymphocytes Relative: 19 %
Lymphs Abs: 1 K/uL (ref 0.7–4.0)
MCH: 33.2 pg (ref 26.0–34.0)
MCHC: 32.7 g/dL (ref 30.0–36.0)
MCV: 101.4 fL — ABNORMAL HIGH (ref 80.0–100.0)
Monocytes Absolute: 0.6 K/uL (ref 0.1–1.0)
Monocytes Relative: 11 %
Neutro Abs: 3.5 K/uL (ref 1.7–7.7)
Neutrophils Relative %: 66 %
Platelets: 124 K/uL — ABNORMAL LOW (ref 150–400)
RBC: 2.14 MIL/uL — ABNORMAL LOW (ref 4.22–5.81)
RDW: 13.9 % (ref 11.5–15.5)
WBC: 5.2 K/uL (ref 4.0–10.5)
nRBC: 0 % (ref 0.0–0.2)

## 2024-09-22 LAB — POC OCCULT BLOOD, ED: Fecal Occult Bld: NEGATIVE

## 2024-09-22 LAB — BASIC METABOLIC PANEL WITH GFR
Anion gap: 12 (ref 5–15)
BUN: 20 mg/dL (ref 8–23)
CO2: 27 mmol/L (ref 22–32)
Calcium: 7.9 mg/dL — ABNORMAL LOW (ref 8.9–10.3)
Chloride: 98 mmol/L (ref 98–111)
Creatinine, Ser: 4.69 mg/dL — ABNORMAL HIGH (ref 0.61–1.24)
GFR, Estimated: 13 mL/min — ABNORMAL LOW (ref >60)
Glucose, Bld: 88 mg/dL (ref 70–99)
Potassium: 3.7 mmol/L (ref 3.5–5.1)
Sodium: 137 mmol/L (ref 135–145)

## 2024-09-22 MED ORDER — OXYCODONE HCL ER 15 MG PO T12A
30.0000 mg | EXTENDED_RELEASE_TABLET | Freq: Once | ORAL | Status: AC
  Administered 2024-09-22: 30 mg via ORAL
  Filled 2024-09-22: qty 2

## 2024-09-22 NOTE — ED Notes (Signed)
 Attempted to call [facility] for nursing to give report on the patient, no answer received after multiple calls.

## 2024-09-22 NOTE — ED Provider Notes (Signed)
 [place] EMERGENCY DEPARTMENT AT [facility] Provider Note   CSN: [id number] Arrival date & time: 09/22/24  1549     Patient presents with: Weakness and Fatigue   Thomas [name] is a 73 y.o. male.  Patient tells me he was at dialysis today and had a syncopal event.  He said he thinks his heart rate and his blood pressure went high.  He said he felt bad.  He said he is better now but he has worsening of his chronic neck and back pain.  He tells me he had a fall and significant fractures and surgeries in his past.  He is on OxyContin  for his pain.  He is currently at a nursing home.  He does not walk due to his amputations.   The history is provided by the patient.  Loss of Consciousness Episode history:  Single Most recent episode:  Today Chronicity:  New Context comment:  Dialysis Associated symptoms: no chest pain, no difficulty breathing, no nausea, no shortness of breath and no vomiting        Prior to Admission medications   Medication Sig Start Date End Date Taking? Authorizing Provider  apixaban  (ELIQUIS ) 5 MG TABS tablet Take 1 tablet (5 mg total) by mouth 2 (two) times daily. 03/15/24   Samtani, Jai-Gurmukh, MD  atorvastatin  (LIPITOR ) 80 MG tablet Take 80 mg by mouth at bedtime.    [provider]  bisacodyl  (DULCOLAX) 10 MG suppository Place 1 suppository (10 mg total) rectally daily as needed for moderate constipation. 03/15/24   Samtani, Jai-Gurmukh, MD  Cholecalciferol  125 MCG (5000 UT) capsule Take 5,000 Units by mouth daily.    [provider]  cyanocobalamin  (VITAMIN B12) 1000 MCG tablet Take 1 tablet by mouth daily.    [provider]  HYDROmorphone  (DILAUDID ) 4 MG tablet Take 4 mg by mouth See admin instructions. Give one tablet by mouth in the morning on Monday, Wednesdays and Fridays dialysis days only    [provider]  lactulose  (CHRONULAC ) 10 GM/15ML solution Take 45 mLs (30 g total) by mouth daily. 03/15/24    Samtani, Jai-Gurmukh, MD  levothyroxine  (SYNTHROID ) 50 MCG tablet Take 50 mcg by mouth daily before breakfast.     [provider]  methocarbamol  (ROBAXIN ) 500 MG tablet Take 500 mg by mouth every 6 (six) hours as needed for muscle spasms. 05/25/24   [provider]  metoprolol  succinate (TOPROL -XL) 100 MG 24 hr tablet Take 100 mg by mouth daily. Take with or immediately following a meal.    [provider]  midodrine  (PROAMATINE ) 5 MG tablet Take 1 tablet (5 mg total) by mouth 3 (three) times daily with meals. 03/15/24   Samtani, Jai-Gurmukh, MD  Nutritional Supplements (NEPRO PO) Take 240 mLs by mouth 2 (two) times daily.    [provider]  ondansetron  (ZOFRAN ) 4 MG tablet Take 4 mg by mouth every 4 (four) hours as needed for nausea or vomiting.    [provider]  oxyCODONE  (OXYCONTIN ) 20 mg 12 hr tablet Take 20 mg by mouth every 12 (twelve) hours.    [provider]  oxyCODONE -acetaminophen  (PERCOCET/ROXICET) 5-325 MG tablet Take 1 tablet by mouth every 6 (six) hours as needed for severe pain (pain score 7-10). 08/23/24   Zackowski, Scott, MD  REXULTI 1 MG TABS tablet Take 1 mg by mouth daily. 06/01/24   [provider]  senna-docusate (SENOKOT-S) 8.6-50 MG tablet Take 4 tablets by mouth 2 (two) times daily.  11/04/22   [provider]  sevelamer  (RENAGEL ) 800 MG tablet Take 1,600 mg by mouth 3 (three) times daily. 08/27/23   [provider]  sevelamer  carbonate (RENVELA ) 800 MG tablet Take 2,400 mg by mouth 3 (three) times daily with meals. 05/19/24   [provider]  sodium zirconium cyclosilicate  (LOKELMA ) 10 g PACK packet Take 10 g by mouth 2 (two) times daily. 03/30/24   Armenta Canning, MD  tamsulosin  (FLOMAX ) 0.4 MG CAPS capsule Take 1 capsule (0.4 mg total) by mouth daily. Patient taking differently: Take 0.4 mg by mouth every evening. 09/25/20   Arnett Saunders, MD  tiZANidine  (ZANAFLEX ) 4 MG tablet Take 4 mg  by mouth every 8 (eight) hours as needed for muscle spasms. 11/26/21   [provider]  torsemide (DEMADEX) 20 MG tablet Take 20 mg by mouth 2 (two) times daily.    [provider]  Vitamin E 670 MG (1000 UT) CAPS Take 1,000 mg by mouth daily.    [provider]    Allergies: Other, Oxymorphone, Amitriptyline, Aspirin, Beta vulgaris, Buspirone, Cabbage, Codeine, Cyclobenzaprine, Diflunisal, Fish allergy, Fish protein-containing drug products, Hydroxyzine, Levetiracetam, Methadone, Nalbuphine, Pentazocine, Propoxyphene, Shellfish allergy, Sulfa antibiotics, Sulfasalazine, and Amoxicillin     Review of Systems  Respiratory:  Negative for shortness of breath.   Cardiovascular:  Positive for syncope. Negative for chest pain.  Gastrointestinal:  Negative for nausea and vomiting.    Updated Vital Signs BP 127/81 (BP Location: Right Arm)   Pulse 71   Temp 98.4 F (36.9 C) (Oral)   Resp 16   Ht 5' 10 (1.778 m)   Wt 90.7 kg   SpO2 99%   BMI 28.70 kg/m   Physical Exam Vitals and nursing note reviewed.  Constitutional:      Appearance: Normal appearance. He is well-developed.  HENT:     Head: Normocephalic and atraumatic.  Eyes:     Conjunctiva/sclera: Conjunctivae normal.  Cardiovascular:     Rate and Rhythm: Normal rate and regular rhythm.     Heart sounds: No murmur heard. Pulmonary:     Effort: Pulmonary effort is normal. No respiratory distress.     Breath sounds: Normal breath sounds.     Comments: He has a vascular catheter in his right upper chest without surrounding erythema Abdominal:     Palpations: Abdomen is soft.     Tenderness: There is no abdominal tenderness. There is no guarding or rebound.  Musculoskeletal:        General: Deformity present.     Cervical back: Neck supple.     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  Neurological:     General: No focal deficit  present.     Mental Status: He is alert.     GCS: GCS eye subscore is 4. GCS verbal subscore is 5. GCS motor subscore is 6.     (all labs ordered are listed, but only abnormal results are displayed) Labs Reviewed  BASIC METABOLIC PANEL WITH GFR - Abnormal; Notable for the following components:      Result Value   Creatinine, Ser 4.69 (*)    Calcium  7.9 (*)    GFR, Estimated 13 (*)    All other components within normal limits  CBC WITH DIFFERENTIAL/PLATELET - Abnormal; Notable for the following components:   RBC 2.14 (*)    Hemoglobin 7.1 (*)    HCT 21.7 (*)  MCV 101.4 (*)    Platelets 124 (*)    All other components within normal limits  POC OCCULT BLOOD, ED    EKG: EKG Interpretation Date/Time:  Thursday September 22 2024 15:50:32 EST Ventricular Rate:  87 PR Interval:  146 QRS Duration:  97 QT Interval:  412 QTC Calculation: 496 R Axis:   -2  Text Interpretation: Sinus rhythm Atrial premature complex Borderline prolonged QT interval No significant change since prior 11/25 Confirmed by Towana Sharper [phone]) on 09/22/2024 3:53:46 PM  Radiology: [facility] Chest Port 1 View Result Date: 09/22/2024 EXAM: 1 VIEW(S) XRAY OF THE CHEST 09/22/2024 05:43:00 PM COMPARISON: Comparison is 08/12/2024 from [facility] incompletely imaged lumbar spine fixation. CLINICAL HISTORY: syncope FINDINGS: LUNGS AND PLEURA: Chronic interstitial coarsening and thickening. No lobar consolidation. No pleural effusion. No pneumothorax. HEART AND MEDIASTINUM: Moderate cardiomegaly. BONES AND SOFT TISSUES: No acute osseous abnormality. IMPRESSION: 1. Cardiomegaly with chronic interstitial thickening and coarsening.this could represent the sequelae of chronic bronchitis or mild interstitial edema. Electronically signed by: Rockey Kilts MD 09/22/2024 06:39 PM EST RP Workstation: [id number]     Procedures   Medications Ordered in the ED  oxyCODONE  (OXYCONTIN ) 12 hr tablet 30 mg (has no administration in  time range)    Clinical Course as of 09/23/24 1000  Thu Sep 22, 2024  1917 Rectal exam with normal tone and brown stool.  Sent to lab for guaiac. [MB]    Clinical Course User Index [MB] Towana Sharper BROCKS, MD                                 Medical Decision Making Amount and/or Complexity of Data Reviewed Labs: ordered. Radiology: ordered.  Risk Prescription drug management.   This patient complains of syncopal event; this involves an extensive number of treatment Options and is a complaint that carries with it a high risk of complications and morbidity. The differential includes syncope, vasovagal, hypovolemia, arrhythmia, metabolic derangement  I ordered, reviewed and interpreted labs, which included CBC with low hemoglobin compared to baseline, low platelets, chemistries consistent with CKD, fecal occult negative I ordered medication oral pain medicine and reviewed PMP when indicated. I ordered imaging studies which included chest x-ray and I independently    visualized and interpreted imaging which showed cardiomegaly Previous records obtained and reviewed in epic including frequent ED visits  Cardiac monitoring reviewed, sinus rhythm Social determinants considered, stress social isolation Critical Interventions: None  After the interventions stated above, I reevaluated the patient and found patient to be symptomatically improved and hemodynamically stable Admission and further testing considered, no indications for admission at this time.  Will have him return back to his facility where they can continue to monitor.  Return instructions discussed      Final diagnoses:  Syncope, unspecified syncope type  ESRD (end stage renal disease) (HCC)  Other chronic back pain  Anemia, unspecified type    ED Discharge Orders     None          Towana Sharper BROCKS, MD 09/23/24 1001

## 2024-09-22 NOTE — ED Notes (Signed)
 CCMD called.

## 2024-09-22 NOTE — ED Triage Notes (Signed)
 Pt. BIB by [facility] from Dialysis with c/o weakness and fatigue; Pt. Reports just feeling bad and presents to the ER pale; Pt. Missed his dialysis on Tuesday and has been experiencing diarrhea since then; Pt. Reports back pain 10/10; VSS per [facility].  VS per [facility]:  HR: 80 BP: 138/80 CBG: 126

## 2024-09-22 NOTE — Discharge Instructions (Addendum)
 You were seen in the emergency department after a possible syncopal event during dialysis.  Your vitals remained stable here.  Your hemoglobin (red blood cell count) was 7 and this is lower than your baseline but there is no evidence of active bleeding.  This will need to be followed at your dialysis.  Continue current medicines otherwise.  Return to the emergency department if any worsening or concerning symptoms.

## 2024-09-22 NOTE — Progress Notes (Signed)
 Received a call from clinic manager at [facility] to say that today was pt's last treatment at clinic as a transient pt. HD clinic states they have attempted to reach staff at snf to make snf aware of this info so that a plan can be made for pt's HD after today. Clinic states they have been unable to reach or discuss this with snf staff for the past 2 weeks. Pt currently does not have a HD clinic in the [place] area after today. Pt has a hx at [facility] but that was before snf placement. Navigator contacted nephrologist and renal PA to be made aware of this info in case ED staff consult nephrology. There is currently no ED attending assigned to pt's case to be made aware of this info. Navigator also reached out to SNF liaison, Madelin Jacobus, to be made aware of the above info. Tammy to investigate situation and return call to navigator if needed. Pt has been at [facility] snf per chart review.   Randine Mungo Dialysis Navigator [phone]

## 2024-09-23 NOTE — Progress Notes (Signed)
 Late Note Entry- Sep 23, 2024  SNF, SNF liaison, and [facility] staff have communicated and worked out a plan for pt's out-pt HD for the future.   Randine Mungo Dialysis Navigator [phone]

## 2024-11-15 ENCOUNTER — Encounter (HOSPITAL_COMMUNITY): Payer: Self-pay

## 2024-11-15 ENCOUNTER — Other Ambulatory Visit: Payer: Self-pay | Admitting: Vascular Surgery

## 2024-11-15 ENCOUNTER — Emergency Department (HOSPITAL_COMMUNITY)

## 2024-11-15 ENCOUNTER — Observation Stay (HOSPITAL_COMMUNITY)
Admission: EM | Admit: 2024-11-15 | Discharge: 2024-11-18 | Disposition: A | Discharge status: Skilled Nursing Facility | Attending: Internal Medicine | Admitting: Internal Medicine

## 2024-11-15 DIAGNOSIS — N186 End stage renal disease: Secondary | ICD-10-CM

## 2024-11-15 DIAGNOSIS — Z86711 Personal history of pulmonary embolism: Secondary | ICD-10-CM | POA: Diagnosis present

## 2024-11-15 DIAGNOSIS — I959 Hypotension, unspecified: Secondary | ICD-10-CM

## 2024-11-15 DIAGNOSIS — Z7901 Long term (current) use of anticoagulants: Secondary | ICD-10-CM | POA: Insufficient documentation

## 2024-11-15 DIAGNOSIS — R051 Acute cough: Secondary | ICD-10-CM | POA: Insufficient documentation

## 2024-11-15 DIAGNOSIS — G9520 Unspecified cord compression: Secondary | ICD-10-CM

## 2024-11-15 DIAGNOSIS — Z89432 Acquired absence of left foot: Secondary | ICD-10-CM | POA: Insufficient documentation

## 2024-11-15 DIAGNOSIS — R55 Syncope and collapse: Principal | ICD-10-CM | POA: Diagnosis present

## 2024-11-15 DIAGNOSIS — D539 Nutritional anemia, unspecified: Secondary | ICD-10-CM | POA: Diagnosis present

## 2024-11-15 DIAGNOSIS — Z86718 Personal history of other venous thrombosis and embolism: Secondary | ICD-10-CM | POA: Insufficient documentation

## 2024-11-15 DIAGNOSIS — Z79899 Other long term (current) drug therapy: Secondary | ICD-10-CM | POA: Insufficient documentation

## 2024-11-15 DIAGNOSIS — R2689 Other abnormalities of gait and mobility: Secondary | ICD-10-CM | POA: Insufficient documentation

## 2024-11-15 DIAGNOSIS — Z992 Dependence on renal dialysis: Secondary | ICD-10-CM | POA: Insufficient documentation

## 2024-11-15 DIAGNOSIS — E1122 Type 2 diabetes mellitus with diabetic chronic kidney disease: Secondary | ICD-10-CM | POA: Insufficient documentation

## 2024-11-15 DIAGNOSIS — Z993 Dependence on wheelchair: Secondary | ICD-10-CM | POA: Insufficient documentation

## 2024-11-15 DIAGNOSIS — E119 Type 2 diabetes mellitus without complications: Secondary | ICD-10-CM

## 2024-11-15 DIAGNOSIS — E039 Hypothyroidism, unspecified: Secondary | ICD-10-CM | POA: Diagnosis present

## 2024-11-15 DIAGNOSIS — M6281 Muscle weakness (generalized): Secondary | ICD-10-CM | POA: Insufficient documentation

## 2024-11-15 DIAGNOSIS — J849 Interstitial pulmonary disease, unspecified: Secondary | ICD-10-CM | POA: Diagnosis not present

## 2024-11-15 DIAGNOSIS — R000 Tachycardia, unspecified: Secondary | ICD-10-CM | POA: Insufficient documentation

## 2024-11-15 DIAGNOSIS — R0902 Hypoxemia: Secondary | ICD-10-CM | POA: Insufficient documentation

## 2024-11-15 DIAGNOSIS — Z89511 Acquired absence of right leg below knee: Secondary | ICD-10-CM | POA: Insufficient documentation

## 2024-11-15 DIAGNOSIS — J9601 Acute respiratory failure with hypoxia: Secondary | ICD-10-CM | POA: Insufficient documentation

## 2024-11-15 DIAGNOSIS — G894 Chronic pain syndrome: Secondary | ICD-10-CM | POA: Diagnosis present

## 2024-11-15 DIAGNOSIS — E66811 Obesity, class 1: Secondary | ICD-10-CM | POA: Insufficient documentation

## 2024-11-15 LAB — CBC WITH DIFFERENTIAL/PLATELET
Abs Immature Granulocytes: 0.02 10*3/uL (ref 0.00–0.07)
Basophils Absolute: 0 10*3/uL (ref 0.0–0.1)
Basophils Relative: 0 %
Eosinophils Absolute: 0.2 10*3/uL (ref 0.0–0.5)
Eosinophils Relative: 3 %
HCT: 21.4 % — ABNORMAL LOW (ref 39.0–52.0)
Hemoglobin: 7 g/dL — ABNORMAL LOW (ref 13.0–17.0)
Immature Granulocytes: 0 %
Lymphocytes Relative: 18 %
Lymphs Abs: 1 10*3/uL (ref 0.7–4.0)
MCH: 35.4 pg — ABNORMAL HIGH (ref 26.0–34.0)
MCHC: 32.7 g/dL (ref 30.0–36.0)
MCV: 108.1 fL — ABNORMAL HIGH (ref 80.0–100.0)
Monocytes Absolute: 0.5 10*3/uL (ref 0.1–1.0)
Monocytes Relative: 8 %
Neutro Abs: 3.8 10*3/uL (ref 1.7–7.7)
Neutrophils Relative %: 71 %
Platelets: 151 10*3/uL (ref 150–400)
RBC: 1.98 MIL/uL — ABNORMAL LOW (ref 4.22–5.81)
RDW: 14.9 % (ref 11.5–15.5)
WBC: 5.5 10*3/uL (ref 4.0–10.5)
nRBC: 0 % (ref 0.0–0.2)

## 2024-11-15 LAB — TROPONIN T, HIGH SENSITIVITY
Troponin T High Sensitivity: 87 ng/L — ABNORMAL HIGH (ref 0–19)
Troponin T High Sensitivity: 88 ng/L — ABNORMAL HIGH (ref 0–19)

## 2024-11-15 LAB — COMPREHENSIVE METABOLIC PANEL WITH GFR
ALT: 7 U/L (ref 0–44)
AST: 18 U/L (ref 15–41)
Albumin: 3.8 g/dL (ref 3.5–5.0)
Alkaline Phosphatase: 76 U/L (ref 38–126)
Anion gap: 12 (ref 5–15)
BUN: 37 mg/dL — ABNORMAL HIGH (ref 8–23)
CO2: 27 mmol/L (ref 22–32)
Calcium: 8.2 mg/dL — ABNORMAL LOW (ref 8.9–10.3)
Chloride: 96 mmol/L — ABNORMAL LOW (ref 98–111)
Creatinine, Ser: 6.99 mg/dL — ABNORMAL HIGH (ref 0.61–1.24)
GFR, Estimated: 8 mL/min — ABNORMAL LOW
Glucose, Bld: 91 mg/dL (ref 70–99)
Potassium: 4.3 mmol/L (ref 3.5–5.1)
Sodium: 136 mmol/L (ref 135–145)
Total Bilirubin: 0.3 mg/dL (ref 0.0–1.2)
Total Protein: 7.7 g/dL (ref 6.5–8.1)

## 2024-11-15 LAB — I-STAT VENOUS BLOOD GAS, ED
Acid-Base Excess: 3 mmol/L — ABNORMAL HIGH (ref 0.0–2.0)
Bicarbonate: 27.9 mmol/L (ref 20.0–28.0)
Calcium, Ion: 1.01 mmol/L — ABNORMAL LOW (ref 1.15–1.40)
HCT: 21 % — ABNORMAL LOW (ref 39.0–52.0)
Hemoglobin: 7.1 g/dL — ABNORMAL LOW (ref 13.0–17.0)
O2 Saturation: 98 %
Potassium: 4.3 mmol/L (ref 3.5–5.1)
Sodium: 137 mmol/L (ref 135–145)
TCO2: 29 mmol/L (ref 22–32)
pCO2, Ven: 42.9 mmHg — ABNORMAL LOW (ref 44–60)
pH, Ven: 7.421 (ref 7.25–7.43)
pO2, Ven: 110 mmHg — ABNORMAL HIGH (ref 32–45)

## 2024-11-15 LAB — I-STAT CHEM 8, ED
BUN: 35 mg/dL — ABNORMAL HIGH (ref 8–23)
Calcium, Ion: 1.01 mmol/L — ABNORMAL LOW (ref 1.15–1.40)
Chloride: 97 mmol/L — ABNORMAL LOW (ref 98–111)
Creatinine, Ser: 8.3 mg/dL — ABNORMAL HIGH (ref 0.61–1.24)
Glucose, Bld: 92 mg/dL (ref 70–99)
HCT: 19 % — ABNORMAL LOW (ref 39.0–52.0)
Hemoglobin: 6.5 g/dL — CL (ref 13.0–17.0)
Potassium: 4.3 mmol/L (ref 3.5–5.1)
Sodium: 136 mmol/L (ref 135–145)
TCO2: 27 mmol/L (ref 22–32)

## 2024-11-15 LAB — RESP PANEL BY RT-PCR (RSV, FLU A&B, COVID)  RVPGX2
Influenza A by PCR: NEGATIVE
Influenza B by PCR: NEGATIVE
Resp Syncytial Virus by PCR: NEGATIVE
SARS Coronavirus 2 by RT PCR: NEGATIVE

## 2024-11-15 LAB — MAGNESIUM: Magnesium: 2.4 mg/dL (ref 1.7–2.4)

## 2024-11-15 LAB — TSH: TSH: 2.38 u[IU]/mL (ref 0.350–4.500)

## 2024-11-15 LAB — I-STAT CG4 LACTIC ACID, ED: Lactic Acid, Venous: 0.5 mmol/L (ref 0.5–1.9)

## 2024-11-15 MED ORDER — IOHEXOL 350 MG/ML SOLN
60.0000 mL | Freq: Once | INTRAVENOUS | Status: AC | PRN
  Administered 2024-11-15: 60 mL via INTRAVENOUS

## 2024-11-15 NOTE — ED Provider Notes (Signed)
 " [place] [facility]ERGENCY DEPARTMENT AT [facility] Provider Note   CSN: [id number] Arrival date & time: 11/15/24  1544     Patient presents with: Drug Overdose   RANDELL TEARE is a 74 y.o. male.   The history is provided by the patient, medical records and the EMS personnel (ems report to nursing). No language interpreter was used.  Loss of Consciousness Episode history:  Single Most recent episode:  Today Timing:  Unable to specify Progression:  Unable to specify Chronicity:  Recurrent Witnessed: no   Relieved by:  Nothing Worsened by:  Nothing Ineffective treatments:  None tried Associated symptoms: chest pain and malaise/fatigue   Associated symptoms: no confusion, no dizziness, no headaches, no nausea, no palpitations, no shortness of breath and no vomiting        Prior to Admission medications  Medication Sig Start Date End Date Taking? Authorizing Provider  apixaban  (ELIQUIS ) 5 MG TABS tablet Take 1 tablet (5 mg total) by mouth 2 (two) times daily. 03/15/24   Samtani, Jai-Gurmukh, MD  atorvastatin  (LIPITOR ) 80 MG tablet Take 80 mg by mouth at bedtime.    [provider]  bisacodyl  (DULCOLAX) 10 MG suppository Place 1 suppository (10 mg total) rectally daily as needed for moderate constipation. 03/15/24   Samtani, Jai-Gurmukh, MD  Cholecalciferol  125 MCG (5000 UT) capsule Take 5,000 Units by mouth daily.    [provider]  cyanocobalamin  (VITAMIN B12) 1000 MCG tablet Take 1 tablet by mouth daily.    [provider]  HYDROmorphone  (DILAUDID ) 4 MG tablet Take 4 mg by mouth See admin instructions. Give one tablet by mouth in the morning on Monday, Wednesdays and Fridays dialysis days only    [provider]  lactulose  (CHRONULAC ) 10 GM/15ML solution Take 45 mLs (30 g total) by mouth daily. 03/15/24   Samtani, Jai-Gurmukh, MD  levothyroxine  (SYNTHROID ) 50 MCG tablet Take 50 mcg by mouth daily before breakfast.     [provider]  methocarbamol  (ROBAXIN ) 500 MG tablet Take 500 mg by mouth every 6 (six) hours as needed for muscle spasms. 05/25/24   [provider]  metoprolol  succinate (TOPROL -XL) 100 MG 24 hr tablet Take 100 mg by mouth daily. Take with or immediately following a meal.    [provider]  midodrine  (PROAMATINE ) 5 MG tablet Take 1 tablet (5 mg total) by mouth 3 (three) times daily with meals. 03/15/24   Samtani, Jai-Gurmukh, MD  Nutritional Supplements (NEPRO PO) Take 240 mLs by mouth 2 (two) times daily.    [provider]  ondansetron  (ZOFRAN ) 4 MG tablet Take 4 mg by mouth every 4 (four) hours as needed for nausea or vomiting.    [provider]  oxyCODONE  (OXYCONTIN ) 20 mg 12 hr tablet Take 20 mg by mouth every 12 (twelve) hours.    [provider]  oxyCODONE -acetaminophen  (PERCOCET/ROXICET) 5-325 MG tablet Take 1 tablet by mouth every 6 (six) hours as needed for severe pain (pain score 7-10). 08/23/24   Zackowski, Scott, MD  REXULTI  1 MG TABS tablet Take 1 mg by mouth daily. 06/01/24   [provider]  senna-docusate (SENOKOT-S) 8.6-50 MG tablet Take 4 tablets by mouth 2 (two) times daily. 11/04/22   [provider]  sevelamer  (RENAGEL ) 800 MG tablet Take 1,600 mg by mouth 3 (three) times daily. 08/27/23   [provider]  sevelamer  carbonate (RENVELA ) 800 MG tablet Take 2,400 mg by mouth 3 (three) times daily with meals. 05/19/24  [provider]  sodium zirconium cyclosilicate  (LOKELMA ) 10 g PACK packet Take 10 g by mouth 2 (two) times daily. 03/30/24   Armenta Canning, MD  tamsulosin  (FLOMAX ) 0.4 MG CAPS capsule Take 1 capsule (0.4 mg total) by mouth daily. Patient taking differently: Take 0.4 mg by mouth every evening. 09/25/20   Arnett Saunders, MD  tiZANidine  (ZANAFLEX ) 4 MG tablet Take 4 mg by mouth every 8 (eight) hours as needed for muscle spasms. 11/26/21   [provider]  torsemide (DEMADEX) 20 MG  tablet Take 20 mg by mouth 2 (two) times daily.    [provider]  Vitamin E 670 MG (1000 UT) CAPS Take 1,000 mg by mouth daily.    [provider]    Allergies: Other, Oxymorphone, Amitriptyline, Aspirin, Beta vulgaris, Buspirone, Cabbage, Codeine, Cyclobenzaprine, Diflunisal, Fish allergy, Fish protein-containing drug products, Hydroxyzine, Levetiracetam, Methadone, Nalbuphine, Pentazocine, Propoxyphene, Shellfish allergy, Sulfa antibiotics, Sulfasalazine, and Amoxicillin     Review of Systems  Constitutional:  Positive for chills, fatigue and malaise/fatigue.  HENT:  Negative for congestion.   Respiratory:  Negative for cough, chest tightness, shortness of breath and wheezing.   Cardiovascular:  Positive for chest pain and syncope. Negative for palpitations and leg swelling.  Gastrointestinal:  Negative for abdominal pain, constipation, diarrhea, nausea and vomiting.  Genitourinary:  Negative for dysuria, flank pain and frequency.  Musculoskeletal:  Negative for back pain, neck pain and neck stiffness.  Skin:  Negative for rash and wound.  Neurological:  Positive for syncope and light-headedness. Negative for dizziness and headaches.  Psychiatric/Behavioral:  Negative for agitation and confusion.   All other systems reviewed and are negative.   Updated Vital Signs BP (!) 146/84   Pulse 78   Temp 98 F (36.7 C)   Resp 18   SpO2 100%   Physical Exam Vitals and nursing note reviewed.  Constitutional:      General: He is not in acute distress.    Appearance: He is well-developed. He is ill-appearing. He is not toxic-appearing or diaphoretic.  HENT:     Head: Normocephalic and atraumatic.     Nose: No congestion or rhinorrhea.     Mouth/Throat:     Mouth: Mucous membranes are moist.     Pharynx: No oropharyngeal exudate or posterior oropharyngeal erythema.  Eyes:     Extraocular Movements: Extraocular movements intact.     Conjunctiva/sclera: Conjunctivae  normal.     Pupils: Pupils are equal, round, and reactive to light.  Cardiovascular:     Rate and Rhythm: Regular rhythm. Tachycardia present.     Heart sounds: No murmur heard. Pulmonary:     Effort: Pulmonary effort is normal. No respiratory distress.     Breath sounds: Rhonchi present. No wheezing or rales.  Chest:     Chest wall: No tenderness.  Abdominal:     General: Abdomen is flat.     Palpations: Abdomen is soft.     Tenderness: There is no abdominal tenderness. There is no guarding or rebound.  Musculoskeletal:        General: Tenderness present. No swelling.     Right shoulder: Tenderness present.     Cervical back: Neck supple. No tenderness.     Comments: Tenderness in right shoulder.  Distally had intact sensation strength and pulse.  Skin:    General: Skin is warm and dry.     Capillary Refill: Capillary refill takes less than 2 seconds.     Findings: No erythema or rash.  Neurological:     Mental Status: He is alert.  Psychiatric:        Mood and Affect: Mood normal.     (all labs ordered are listed, but only abnormal results are displayed) Labs Reviewed  CBC WITH DIFFERENTIAL/PLATELET - Abnormal; Notable for the following components:      Result Value   RBC 1.98 (*)    Hemoglobin 7.0 (*)    HCT 21.4 (*)    MCV 108.1 (*)    MCH 35.4 (*)    All other components within normal limits  COMPREHENSIVE METABOLIC PANEL WITH GFR - Abnormal; Notable for the following components:   Chloride 96 (*)    BUN 37 (*)    Creatinine, Ser 6.99 (*)    Calcium  8.2 (*)    GFR, Estimated 8 (*)    All other components within normal limits  I-STAT VENOUS BLOOD GAS, ED - Abnormal; Notable for the following components:   pCO2, Ven 42.9 (*)    pO2, Ven 110 (*)    Acid-Base Excess 3.0 (*)    Calcium , Ion 1.01 (*)    HCT 21.0 (*)    Hemoglobin 7.1 (*)    All other components within normal limits  I-STAT CHEM 8, ED - Abnormal; Notable for the following components:   Chloride  97 (*)    BUN 35 (*)    Creatinine, Ser 8.30 (*)    Calcium , Ion 1.01 (*)    Hemoglobin 6.5 (*)    HCT 19.0 (*)    All other components within normal limits  TROPONIN T, HIGH SENSITIVITY - Abnormal; Notable for the following components:   Troponin T High Sensitivity 87 (*)    All other components within normal limits  TROPONIN T, HIGH SENSITIVITY - Abnormal; Notable for the following components:   Troponin T High Sensitivity 88 (*)    All other components within normal limits  RESP PANEL BY RT-PCR (RSV, FLU A&B, COVID)  RVPGX2  TSH  MAGNESIUM   URINALYSIS, W/ REFLEX TO CULTURE (INFECTION SUSPECTED)  I-STAT CG4 LACTIC ACID, ED  I-STAT CG4 LACTIC ACID, ED    EKG: EKG Interpretation Date/Time:  Tuesday November 15 2024 15:53:01 EST Ventricular Rate:  77 PR Interval:  199 QRS Duration:  103 QT Interval:  405 QTC Calculation: 459 R Axis:   -5  Text Interpretation: Sinus rhythm Atrial premature complexes Consider anterior infarct when compared to prior, similar appearance No STEMI Confirmed by Ginger Barefoot ([id number]) on 11/15/2024 4:58:28 PM  Radiology: CT Angio Chest PE W and/or Wo Contrast Result Date: 11/15/2024 EXAM: CTA of the Chest without and with contrast for PE 11/15/2024 09:00:00 PM TECHNIQUE: CTA of the chest was performed without and with the administration of 60 mL of iohexol  (OMNIPAQUE ) 350 MG/ML injection. Multiplanar reformatted images are provided for review. MIP images are provided for review. Automated exposure control, iterative reconstruction, and/or weight based adjustment of the mA/kV was utilized to reduce the radiation dose to as low as reasonably achievable. COMPARISON: CT examination of 03/30/2024. CLINICAL HISTORY: High probability for pulmonary embolism. History of pulmonary emboli, hypoxia, hypotension, tachycardia, and unresponsiveness at dialysis. FINDINGS: PULMONARY ARTERIES: Pulmonary arteries are adequately opacified for evaluation. No pulmonary embolism.  Central pulmonary arteries are of normal caliber. MEDIASTINUM: Median sternotomy has been performed. Extensive multivessel coronary artery calcifications noted predominantly within the left anterior descending coronary artery. Global cardiac size within normal limits. No pericardial effusion. Right internal jugular hemodialysis catheter tip seen within the right atrium. Mild  atherosclerotic calcification within the thoracic aorta. No aortic aneurysm. LYMPH NODES: No mediastinal, hilar or axillary lymphadenopathy. LUNGS AND PLEURA: Disease or again identified with architectural distortion, reticulation, and volume loss predominantly within the upper and mid lung zones. The findings are not optimally assessed on this examination but may reflect changes of chronic hypersensitivity pneumonitis or postinflammatory changes of sarcoidosis. These findings will be better assessed with dedicated non-emergent high-resolution CT examination of the chest once the patient's acute issues have resolved. No pleural effusion or pneumothorax. UPPER ABDOMEN: Severe right hydronephrosis with marked atrophy of the right kidney and moderate parenchymal atrophy of the left kidney again noted within the visualized upper abdomen. No acute abnormality. SOFT TISSUES AND BONES: Median sternotomy has been performed. Advanced degenerative changes are again identified at the circ of thoracic junction. Extensive erosive changes involving the T9, T10, and T11 vertebral bodies are again identified status post posterior thoracic fusion with instrumentation with bilateral pedicle screws at T10 and T12 and posterior volars as well as posterior decompression of T10. These findings are similar to that noted on prior CT examination of 03/30/2024. Evidence of increasing sclerosis at the T9-T10 interface in keeping with interval healing. IMPRESSION: 1. No pulmonary embolism. 2. Chronic upper and mid lung zone parenchymal changes been consistent with  underlying fibrotic interstitial lung disease , which may reflect chronic hypersensitivity pneumonitis or postinflammatory sarcoidosis. Recommend dedicated non-emergent high-resolution CT chest once acute issues have resolved. 3. Median sternotomy and extensive multivessel coronary artery calcifications. 4. Severe right hydronephrosis with marked right renal atrophy and moderate left renal parenchymal atrophy. Stable. 5. Advanced degenerative changes at the cervicothoracic junction and extensive erosive changes involving the T9, T10, and T11 vertebral bodies, status post posterior thoracic fusion with instrumentation and posterior decompression of T10, with interval healing at the T9-10 interface. Electronically signed by: Dorethia Molt MD 11/15/2024 09:33 PM EST RP Workstation: [id number]   DG Shoulder Right Result Date: 11/15/2024 EXAM: 1 VIEW(S) XRAY OF THE RIGHT SHOULDER 11/15/2024 05:24:00 PM COMPARISON: None available. CLINICAL HISTORY: Syncope at dialysis, hypoxia, chills, right shoulder pain. FINDINGS: LINES AND TUBES: Right IJ dialysis catheter in place. BONES AND JOINTS: Glenohumeral joint is normally aligned. No acute fracture. No malalignment. The [facility] joint shows mild degenerative changes. Lower thoracic spinal fusion hardware noted. SOFT TISSUES: Soft tissue calcifications in right upper arm. Visualized lung is unremarkable. IMPRESSION: 1. No acute findings. 2. Mild degenerative changes at the acromioclavicular joint. Electronically signed by: Oneil Devonshire MD 11/15/2024 06:01 PM EST RP Workstation: [id number]   DG Chest Portable 1 View Result Date: 11/15/2024 EXAM: 1 VIEW(S) XRAY OF THE CHEST 11/15/2024 05:24:00 PM COMPARISON: 09/22/2024 CLINICAL HISTORY: Syncope at dialysis, hypoxia, chills, right shoulder pain. FINDINGS: LINES, TUBES AND DEVICES: Right dialysis catheter in place. LUNGS AND PLEURA: Chronic interstitial thickening and increased interstitial markings, most pronounced in right upper  lobe, similar to prior exams. Mild pulmonary edema. No focal pulmonary opacity. No pleural effusion. No pneumothorax. HEART AND MEDIASTINUM: Stable cardiomediastinal silhouette with cardiomegaly. Aortic atherosclerosis. BONES AND SOFT TISSUES: Prior median sternotomy noted. Lower thoracic fusion hardware noted. No acute osseous abnormality. IMPRESSION: 1. Mild pulmonary edema. 2. Stable cardiomegaly. 3. Right dialysis catheter in place. Electronically signed by: Oneil Devonshire MD 11/15/2024 05:59 PM EST RP Workstation: [id number]     Procedures   Medications Ordered in the ED  iohexol  (OMNIPAQUE ) 350 MG/ML injection 60 mL (60 mLs Intravenous Contrast Given 11/15/24 2115)  Medical Decision Making Amount and/or Complexity of Data Reviewed Labs: ordered. Radiology: ordered.  Risk Prescription drug management. Decision regarding hospitalization.    Thomas [name] is a 74 y.o. 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  According to EMS report to nursing and documentation, patient was at dialysis and had 3 hours of his treatment and then went unresponsive.  He reportedly had to be bagged because he was not breathing.  They gave some Narcan  and he is slightly improved.  He tells me he is just having chills and has some pain when he takes a deep breath in his right shoulder and right upper lateral chest.  He denies any trauma.  He reports no fevers but has a chills.  He denies any congestion or cough.  He denies any shortness of breath, nausea, vomiting, constipation, diarrhea, or other complaints.  When I first  went to examine patient, he had a heart rate about 150 that appeared to be sinus tachycardia on the monitor and his oxygen saturations were 85% on room air and his blood pressure was 80 systolic.  I quickly woke the patient up as he was resting and put him on oxygen with nursing.  Heart rate improved into the 80s and blood pressure improved to about 110.  Given the patient's pleuritic torso symptoms, questionable Eliquis  use, and his history of thromboembolic disease, we will get a CT PE study and workup to look for infection as well given his chills.  Will get labs we will get cardiac enzymes.  Given his history of UTI we will check urinalysis although he told me he does not make much urine.  Will get further workup but given his vital signs on my initial evaluation, syncopal episode, and his discomfort, anticipate likely require admission.  9:49 PM Workup continues to return without a superclear etiology of the patient's symptoms.  His hemoglobin is still low but similar to what it was a month ago at 7.  His  CT scan did not show evidence of pulmonary embolism and his x-ray of the chest and shoulder were unremarkable aside from some degenerative changes in the acromioclavicular joint.  Patient's blood pressure has been in the 80s and 90s and on recheck just now was just around 110.  Given the new hypoxia when he does not take oxygen, the syncopal episode, the tachycardia hypotension and soft pressures he has had, do not feel he is safe for discharge home although there is still not a clear cause of his symptoms.  Will call for admission given the concerning syncope.       Final diagnoses:  Syncope, unspecified syncope type  Hypoxia  Tachycardia  Hypotension, unspecified hypotension type  Acute cough    Clinical Impression: 1. Syncope, unspecified syncope type   2. Hypoxia   3. Tachycardia   4. Hypotension, unspecified hypotension type   5. Acute cough     Disposition: Admit  This  note was prepared with assistance of Dragon voice recognition software. Occasional wrong-word or sound-a-like substitutions may have occurred due to the inherent limitations of voice recognition software.      Deziree Mokry, Lonni PARAS, MD 11/15/24 [phone]  "

## 2024-11-15 NOTE — ED Triage Notes (Signed)
 PT BIB [facility] from dialysis.PT had 3hrs of tx then went unresponsive, stopped breathing and had to be bagged by EMS.EMS administered 1mg  of narcan  nasally. PT is A+O X 4 and c/o pain in his legs.

## 2024-11-15 NOTE — ED Notes (Signed)
 Called and placed PT on monitor with CCMD

## 2024-11-15 NOTE — ED Notes (Signed)
 Called 4E and made floor aware that pt coming up. Pt clean and dry.

## 2024-11-15 NOTE — ED Notes (Signed)
 2 unsuccessful attempts to collect blood.IV team and phlebotomy consulted.

## 2024-11-16 ENCOUNTER — Observation Stay (HOSPITAL_COMMUNITY)

## 2024-11-16 ENCOUNTER — Encounter (HOSPITAL_COMMUNITY): Payer: Self-pay | Admitting: Family Medicine

## 2024-11-16 DIAGNOSIS — R55 Syncope and collapse: Secondary | ICD-10-CM

## 2024-11-16 DIAGNOSIS — J849 Interstitial pulmonary disease, unspecified: Secondary | ICD-10-CM | POA: Diagnosis present

## 2024-11-16 LAB — CBC
HCT: 21.7 % — ABNORMAL LOW (ref 39.0–52.0)
Hemoglobin: 7.1 g/dL — ABNORMAL LOW (ref 13.0–17.0)
MCH: 34.8 pg — ABNORMAL HIGH (ref 26.0–34.0)
MCHC: 32.7 g/dL (ref 30.0–36.0)
MCV: 106.4 fL — ABNORMAL HIGH (ref 80.0–100.0)
Platelets: 153 10*3/uL (ref 150–400)
RBC: 2.04 MIL/uL — ABNORMAL LOW (ref 4.22–5.81)
RDW: 15 % (ref 11.5–15.5)
WBC: 6.2 10*3/uL (ref 4.0–10.5)
nRBC: 0 % (ref 0.0–0.2)

## 2024-11-16 LAB — FOLATE: Folate: 9.1 ng/mL

## 2024-11-16 LAB — BASIC METABOLIC PANEL WITH GFR
Anion gap: 12 (ref 5–15)
BUN: 41 mg/dL — ABNORMAL HIGH (ref 8–23)
CO2: 28 mmol/L (ref 22–32)
Calcium: 8.1 mg/dL — ABNORMAL LOW (ref 8.9–10.3)
Chloride: 96 mmol/L — ABNORMAL LOW (ref 98–111)
Creatinine, Ser: 8.12 mg/dL — ABNORMAL HIGH (ref 0.61–1.24)
GFR, Estimated: 7 mL/min — ABNORMAL LOW
Glucose, Bld: 101 mg/dL — ABNORMAL HIGH (ref 70–99)
Potassium: 4.3 mmol/L (ref 3.5–5.1)
Sodium: 136 mmol/L (ref 135–145)

## 2024-11-16 LAB — ECHOCARDIOGRAM COMPLETE
Area-P 1/2: 2.93 cm2
Height: 70 in
S' Lateral: 3.2 cm
Weight: 3400.38 [oz_av]

## 2024-11-16 LAB — IRON AND TIBC
Iron: 50 ug/dL (ref 45–182)
Saturation Ratios: 27 % (ref 17.9–39.5)
TIBC: 186 ug/dL — ABNORMAL LOW (ref 250–450)
UIBC: 137 ug/dL

## 2024-11-16 LAB — HEPATITIS B SURFACE ANTIGEN: Hepatitis B Surface Ag: NONREACTIVE

## 2024-11-16 LAB — VITAMIN B12: Vitamin B-12: 1779 pg/mL — ABNORMAL HIGH (ref 180–914)

## 2024-11-16 LAB — FERRITIN: Ferritin: 764 ng/mL — ABNORMAL HIGH (ref 24–336)

## 2024-11-16 LAB — GLUCOSE, CAPILLARY: Glucose-Capillary: 84 mg/dL (ref 70–99)

## 2024-11-16 MED ORDER — ACETAMINOPHEN 325 MG PO TABS
650.0000 mg | ORAL_TABLET | Freq: Four times a day (QID) | ORAL | Status: DC | PRN
  Administered 2024-11-17: 650 mg via ORAL

## 2024-11-16 MED ORDER — BREXPIPRAZOLE 1 MG PO TABS
1.0000 mg | ORAL_TABLET | Freq: Every day | ORAL | Status: DC
  Administered 2024-11-16 – 2024-11-18 (×3): 1 mg via ORAL
  Filled 2024-11-16 (×3): qty 1

## 2024-11-16 MED ORDER — VITAMIN B-12 1000 MCG PO TABS
1000.0000 ug | ORAL_TABLET | Freq: Every day | ORAL | Status: DC
  Administered 2024-11-16 – 2024-11-18 (×3): 1000 ug via ORAL
  Filled 2024-11-16 (×3): qty 1

## 2024-11-16 MED ORDER — OXYCODONE HCL 5 MG PO TABS
5.0000 mg | ORAL_TABLET | Freq: Four times a day (QID) | ORAL | Status: DC | PRN
  Administered 2024-11-17 – 2024-11-18 (×4): 5 mg via ORAL
  Filled 2024-11-16 (×4): qty 1

## 2024-11-16 MED ORDER — METHOCARBAMOL 500 MG PO TABS
500.0000 mg | ORAL_TABLET | Freq: Four times a day (QID) | ORAL | Status: DC | PRN
  Administered 2024-11-16 – 2024-11-18 (×4): 500 mg via ORAL
  Filled 2024-11-16 (×5): qty 1

## 2024-11-16 MED ORDER — CALCITRIOL 0.25 MCG PO CAPS
0.2500 ug | ORAL_CAPSULE | ORAL | Status: DC
  Administered 2024-11-17: 0.25 ug via ORAL
  Filled 2024-11-16: qty 1

## 2024-11-16 MED ORDER — MIDODRINE HCL 5 MG PO TABS: 5.0000 mg | ORAL_TABLET | Freq: Two times a day (BID) | ORAL | Status: DC | PRN

## 2024-11-16 MED ORDER — HEPARIN SODIUM (PORCINE) 5000 UNIT/ML IJ SOLN
5000.0000 [IU] | Freq: Three times a day (TID) | INTRAMUSCULAR | Status: DC
  Administered 2024-11-16 – 2024-11-18 (×5): 5000 [IU] via SUBCUTANEOUS
  Filled 2024-11-16 (×5): qty 1

## 2024-11-16 MED ORDER — SENNOSIDES-DOCUSATE SODIUM 8.6-50 MG PO TABS
4.0000 | ORAL_TABLET | Freq: Two times a day (BID) | ORAL | Status: DC
  Administered 2024-11-16 – 2024-11-18 (×5): 4 via ORAL
  Filled 2024-11-16 (×5): qty 4

## 2024-11-16 MED ORDER — LEVOTHYROXINE SODIUM 50 MCG PO TABS
50.0000 ug | ORAL_TABLET | Freq: Every day | ORAL | Status: DC
  Administered 2024-11-17 – 2024-11-18 (×2): 50 ug via ORAL
  Filled 2024-11-16 (×2): qty 1

## 2024-11-16 MED ORDER — CHLORHEXIDINE GLUCONATE CLOTH 2 % EX PADS
6.0000 | MEDICATED_PAD | Freq: Every day | CUTANEOUS | Status: DC
  Administered 2024-11-17 – 2024-11-18 (×2): 6 via TOPICAL

## 2024-11-16 MED ORDER — LIDOCAINE HCL (PF) 1 % IJ SOLN
5.0000 mL | INTRAMUSCULAR | Status: DC | PRN
  Filled 2024-11-16: qty 5

## 2024-11-16 MED ORDER — TAMSULOSIN HCL 0.4 MG PO CAPS: 0.4000 mg | ORAL_CAPSULE | Freq: Every day | ORAL | Status: DC

## 2024-11-16 MED ORDER — BISACODYL 10 MG RE SUPP: 10.0000 mg | Freq: Every day | RECTAL | Status: DC | PRN

## 2024-11-16 MED ORDER — ACETAMINOPHEN 650 MG RE SUPP: 650.0000 mg | Freq: Four times a day (QID) | RECTAL | Status: DC | PRN

## 2024-11-16 MED ORDER — DARBEPOETIN ALFA 100 MCG/0.5ML IJ SOSY
100.0000 ug | PREFILLED_SYRINGE | INTRAMUSCULAR | Status: DC
  Administered 2024-11-17: 100 ug via SUBCUTANEOUS
  Filled 2024-11-16: qty 0.5

## 2024-11-16 MED ORDER — PERFLUTREN LIPID MICROSPHERE
1.0000 mL | INTRAVENOUS | Status: AC | PRN
  Administered 2024-11-16: 3 mL via INTRAVENOUS

## 2024-11-16 MED ORDER — BETHANECHOL CHLORIDE 10 MG PO TABS
10.0000 mg | ORAL_TABLET | Freq: Two times a day (BID) | ORAL | Status: DC
  Administered 2024-11-16 – 2024-11-18 (×5): 10 mg via ORAL
  Filled 2024-11-16 (×5): qty 1

## 2024-11-16 MED ORDER — PANTOPRAZOLE SODIUM 40 MG PO TBEC
40.0000 mg | DELAYED_RELEASE_TABLET | Freq: Every day | ORAL | Status: DC
  Administered 2024-11-16 – 2024-11-18 (×3): 40 mg via ORAL
  Filled 2024-11-16 (×3): qty 1

## 2024-11-16 MED ORDER — SERTRALINE HCL 50 MG PO TABS
50.0000 mg | ORAL_TABLET | Freq: Every day | ORAL | Status: DC
  Administered 2024-11-16 – 2024-11-18 (×3): 50 mg via ORAL
  Filled 2024-11-16 (×3): qty 1

## 2024-11-16 MED ORDER — ATORVASTATIN CALCIUM 80 MG PO TABS
80.0000 mg | ORAL_TABLET | Freq: Every day | ORAL | Status: DC
  Administered 2024-11-16 – 2024-11-17 (×2): 80 mg via ORAL
  Filled 2024-11-16 (×2): qty 1

## 2024-11-16 MED ORDER — SENNA 8.6 MG PO TABS: 1.0000 | ORAL_TABLET | Freq: Every day | ORAL | Status: DC | PRN

## 2024-11-16 MED ORDER — PROCHLORPERAZINE EDISYLATE 10 MG/2ML IJ SOLN
5.0000 mg | Freq: Four times a day (QID) | INTRAMUSCULAR | Status: DC | PRN
  Administered 2024-11-18 (×2): 5 mg via INTRAVENOUS
  Filled 2024-11-16 (×2): qty 2

## 2024-11-16 MED ORDER — SODIUM CHLORIDE 0.9% FLUSH
3.0000 mL | Freq: Two times a day (BID) | INTRAVENOUS | Status: DC
  Administered 2024-11-16 – 2024-11-18 (×5): 3 mL via INTRAVENOUS

## 2024-11-16 MED ORDER — OXYCODONE HCL 5 MG PO TABS
5.0000 mg | ORAL_TABLET | Freq: Four times a day (QID) | ORAL | Status: DC | PRN
  Filled 2024-11-16: qty 1

## 2024-11-16 NOTE — Consult Note (Signed)
 [facility] Renal Consultation Note    Indication for Consultation:  Management of ESRD/hemodialysis, anemia, hypertension/volume, and secondary hyperparathyroidism.  HPI: Thomas [name] is a 74 y.o. male with PMH including ESRD on dialysis, DM, HTN, who presented to the ED with syncope. He completed 3 hours of dialysis when he became unresponsive and did not appear to be breathing, reportedly needed to be agged. EMS administered 1mg  of narcan . On admission, he was hypotensive with O2 sats 85% on RA. Hgb was 7. CTA without PE but consistent with fibrotic Insterstitial lung disease. He reported cough but said it was chronic. He was admitted for cardiac monitoring, echocardiogram and orthostatic vitals. Nephrology was consulted for management of ESRD. Pt seen in room, appears tired but awakens to voice. He denies HA, dizziness, CP, palpitations, SOB, abdominal pain, N/V/D. ROS limited as he required repeated prompting to stay awake, says he was up all night.   Past Medical History:  Diagnosis Date   Arthritis    BPH (benign prostatic hyperplasia) 11/24/2019   Chronic pain syndrome 11/24/2019   Diabetes mellitus without complication (HCC)    ESRD (end stage renal disease) (HCC)    on HD   Gout    Hypertension    Muscle pain    Physical deconditioning 11/24/2019   Scars    Swelling    Past Surgical History:  Procedure Laterality Date   AMPUTATION Left 02/17/2024   Procedure: AMPUTATION, FOOT, PARTIAL;  Surgeon: Harden Jerona GAILS, MD;  Location: [facility] OR;  Service: Orthopedics;  Laterality: Left;  LEFT TRANSMETATARSAL AMPUTATION   BELOW KNEE LEG AMPUTATION Right    ESOPHAGOGASTRODUODENOSCOPY (EGD) WITH PROPOFOL  N/A 11/25/2019   Procedure: ESOPHAGOGASTRODUODENOSCOPY (EGD) WITH PROPOFOL ;  Surgeon: Rollin Dover, MD;  Location: [facility];  Service: Endoscopy;  Laterality: N/A;   IR FLUORO GUIDE CV LINE RIGHT  09/05/2019   IR LUMBAR DISC ASPIRATION W/IMG GUIDE  11/26/2019   IR US  GUIDE  VASC ACCESS RIGHT  09/05/2019   RADIOLOGY WITH ANESTHESIA N/A 11/25/2019   Procedure: Thoracic and Lumbar MRI WITH ANESTHESIA;  Surgeon: Radiologist, Medication, MD;  Location: [facility] OR;  Service: Radiology;  Laterality: N/A;   TUNNELLED CATHETER EXCHANGE N/A 07/22/2024   Procedure: TUNNELLED CATHETER EXCHANGE;  Surgeon: Gretta Lonni PARAS, MD;  Location: [facility];  Service: Cardiovascular;  Laterality: N/A;   Family History  Problem Relation Age of Onset   Heart disease Mother    Cancer Father    Social History:  reports that he has never smoked. He has never been exposed to tobacco smoke. He has never used smokeless tobacco. He reports that he does not drink alcohol and does not use drugs.  ROS: As per HPI otherwise negative.  Physical Exam: Vitals:   11/15/24 2303 11/16/24 0027 11/16/24 0319 11/16/24 0808  BP: 109/70 122/74 (!) 86/54 (!) 95/56  Pulse: 72 75 79   Resp: 16 16 16 16   Temp: 98 F (36.7 C) 98.2 F (36.8 C) 98.6 F (37 C)   TempSrc: Oral Oral Oral Oral  SpO2: 97% 92% 90%   Weight:  91.3 kg 96.4 kg   Height:  5' 10 (1.778 m)       General: Well developed, well nourished, in no acute distress. Head: Normocephalic, atraumatic, sclera non-icteric, mucus membranes are moist. Neck: Supple without lymphadenopathy/masses. JVD not elevated. Lungs: Clear bilaterally to auscultation without wheezes, rales, or rhonchi. Breathing is unlabored. Heart: RRR with normal S1, S2. No murmurs, rubs, or gallops appreciated. Abdomen: Soft,  non-tender, non-distended with normoactive bowel sounds. No rebound/guarding. No obvious abdominal masses. Musculoskeletal:  Strength and tone appear normal for age. Lower extremities: No edema or ischemic changes, no open wounds. Neuro: Alert and oriented X 3. Moves all extremities spontaneously. Psych:  Responds to questions appropriately with a normal affect. Dialysis Access:  Allergies[1] Prior to Admission medications  Medication Sig Start  Date End Date Taking? Authorizing Provider  apixaban  (ELIQUIS ) 5 MG TABS tablet Take 1 tablet (5 mg total) by mouth 2 (two) times daily. 03/15/24   Samtani, Jai-Gurmukh, MD  atorvastatin  (LIPITOR ) 80 MG tablet Take 80 mg by mouth at bedtime.    [provider]  bisacodyl  (DULCOLAX) 10 MG suppository Place 1 suppository (10 mg total) rectally daily as needed for moderate constipation. 03/15/24   Samtani, Jai-Gurmukh, MD  Cholecalciferol  125 MCG (5000 UT) capsule Take 5,000 Units by mouth daily.    [provider]  cyanocobalamin  (VITAMIN B12) 1000 MCG tablet Take 1 tablet by mouth daily.    [provider]  HYDROmorphone  (DILAUDID ) 4 MG tablet Take 4 mg by mouth See admin instructions. Give one tablet by mouth in the morning on Monday, Wednesdays and Fridays dialysis days only    [provider]  lactulose  (CHRONULAC ) 10 GM/15ML solution Take 45 mLs (30 g total) by mouth daily. 03/15/24   Samtani, Jai-Gurmukh, MD  levothyroxine  (SYNTHROID ) 50 MCG tablet Take 50 mcg by mouth daily before breakfast.     [provider]  methocarbamol  (ROBAXIN ) 500 MG tablet Take 500 mg by mouth every 6 (six) hours as needed for muscle spasms. 05/25/24   [provider]  metoprolol  succinate (TOPROL -XL) 100 MG 24 hr tablet Take 100 mg by mouth daily. Take with or immediately following a meal.    [provider]  midodrine  (PROAMATINE ) 5 MG tablet Take 1 tablet (5 mg total) by mouth 3 (three) times daily with meals. 03/15/24   Samtani, Jai-Gurmukh, MD  Nutritional Supplements (NEPRO PO) Take 240 mLs by mouth 2 (two) times daily.    [provider]  ondansetron  (ZOFRAN ) 4 MG tablet Take 4 mg by mouth every 4 (four) hours as needed for nausea or vomiting.    [provider]  oxyCODONE  (OXYCONTIN ) 20 mg 12 hr tablet Take 20 mg by mouth every 12 (twelve) hours.    [provider]  oxyCODONE -acetaminophen  (PERCOCET/ROXICET) 5-325 MG tablet Take  1 tablet by mouth every 6 (six) hours as needed for severe pain (pain score 7-10). 08/23/24   Zackowski, Scott, MD  REXULTI  1 MG TABS tablet Take 1 mg by mouth daily. 06/01/24   [provider]  senna-docusate (SENOKOT-S) 8.6-50 MG tablet Take 4 tablets by mouth 2 (two) times daily. 11/04/22   [provider]  sevelamer  (RENAGEL ) 800 MG tablet Take 1,600 mg by mouth 3 (three) times daily. 08/27/23   [provider]  sevelamer  carbonate (RENVELA ) 800 MG tablet Take 2,400 mg by mouth 3 (three) times daily with meals. 05/19/24   [provider]  sodium zirconium cyclosilicate  (LOKELMA ) 10 g PACK packet Take 10 g by mouth 2 (two) times daily. 03/30/24   Armenta Canning, MD  tamsulosin  (FLOMAX ) 0.4 MG CAPS capsule Take 1 capsule (0.4 mg total) by mouth daily. Patient taking differently: Take 0.4 mg by mouth every evening. 09/25/20   Arnett Saunders, MD  tiZANidine  (ZANAFLEX ) 4 MG tablet Take 4 mg by mouth every 8 (eight) hours as needed for muscle spasms. 11/26/21   [provider]  torsemide (DEMADEX) 20 MG tablet Take 20 mg by mouth 2 (two) times daily.    [provider]  Vitamin E 670 MG (1000 UT) CAPS Take 1,000 mg by mouth daily.    [provider]   Current Facility-Administered Medications  Medication Dose Route Frequency Provider Last Rate Last Admin   acetaminophen  (TYLENOL ) tablet 650 mg  650 mg Oral Q6H PRN Opyd, Evalene RAMAN, MD       Or   acetaminophen  (TYLENOL ) suppository 650 mg  650 mg Rectal Q6H PRN Opyd, Evalene RAMAN, MD       heparin  injection 5,000 Units  5,000 Units Subcutaneous Q8H Opyd, Evalene RAMAN, MD       oxyCODONE  (Oxy IR/ROXICODONE ) immediate release tablet 5-10 mg  5-10 mg Oral Q6H PRN Opyd, Evalene RAMAN, MD       prochlorperazine  (COMPAZINE ) injection 5 mg  5 mg Intravenous Q6H PRN Opyd, Evalene RAMAN, MD       senna (SENOKOT) tablet 8.6 mg  1 tablet Oral Daily PRN Opyd, Evalene RAMAN, MD       sodium chloride  flush (NS) 0.9 %  injection 3 mL  3 mL Intravenous Q12H Charlton Evalene RAMAN, MD       Labs: Basic Metabolic Panel: Recent Labs  Lab 11/15/24 1852 11/15/24 1953 11/16/24 0406  NA 136 136  137 136  K 4.3 4.3  4.3 4.3  CL 96* 97* 96*  CO2 27  --  28  GLUCOSE 91 92 101*  BUN 37* 35* 41*  CREATININE 6.99* 8.30* 8.12*  CALCIUM  8.2*  --  8.1*   Liver Function Tests: Recent Labs  Lab 11/15/24 1852  AST 18  ALT 7  ALKPHOS 76  BILITOT 0.3  PROT 7.7  ALBUMIN  3.8   No results for input(s): LIPASE, AMYLASE in the last 168 hours. No results for input(s): AMMONIA in the last 168 hours. CBC: Recent Labs  Lab 11/15/24 1852 11/15/24 1953 11/16/24 0406  WBC 5.5  --  6.2  NEUTROABS 3.8  --   --   HGB 7.0* 6.5*  7.1* 7.1*  HCT 21.4* 19.0*  21.0* 21.7*  MCV 108.1*  --  106.4*  PLT 151  --  153   Cardiac Enzymes: No results for input(s): CKTOTAL, CKMB, CKMBINDEX, TROPONINI in the last 168 hours. CBG: Recent Labs  Lab 11/16/24 0559  GLUCAP 84   Iron  Studies: No results for input(s): IRON , TIBC, TRANSFERRIN, FERRITIN in the last 72 hours. Studies/Results: CT Angio Chest PE W and/or Wo Contrast Result Date: 11/15/2024 EXAM: CTA of the Chest without and with contrast for PE 11/15/2024 09:00:00 PM TECHNIQUE: CTA of the chest was performed without and with the administration of 60 mL of iohexol  (OMNIPAQUE ) 350 MG/ML injection. Multiplanar reformatted images are provided for review. MIP images are provided for review. Automated exposure control, iterative reconstruction, and/or weight based adjustment of the mA/kV was utilized to reduce the radiation dose to as low as reasonably achievable. COMPARISON: CT examination of 03/30/2024. CLINICAL HISTORY: High probability for pulmonary embolism. History of pulmonary emboli, hypoxia, hypotension, tachycardia, and unresponsiveness at dialysis. FINDINGS: PULMONARY ARTERIES: Pulmonary arteries are adequately opacified for evaluation. No pulmonary  embolism. Central pulmonary arteries are of normal caliber. MEDIASTINUM: Median sternotomy has been performed. Extensive multivessel coronary artery calcifications noted predominantly within the left anterior descending coronary artery. Global cardiac size within normal limits. No pericardial effusion. Right internal jugular hemodialysis catheter tip seen within the right atrium. Mild atherosclerotic calcification within  the thoracic aorta. No aortic aneurysm. LYMPH NODES: No mediastinal, hilar or axillary lymphadenopathy. LUNGS AND PLEURA: Disease or again identified with architectural distortion, reticulation, and volume loss predominantly within the upper and mid lung zones. The findings are not optimally assessed on this examination but may reflect changes of chronic hypersensitivity pneumonitis or postinflammatory changes of sarcoidosis. These findings will be better assessed with dedicated non-emergent high-resolution CT examination of the chest once the patient's acute issues have resolved. No pleural effusion or pneumothorax. UPPER ABDOMEN: Severe right hydronephrosis with marked atrophy of the right kidney and moderate parenchymal atrophy of the left kidney again noted within the visualized upper abdomen. No acute abnormality. SOFT TISSUES AND BONES: Median sternotomy has been performed. Advanced degenerative changes are again identified at the circ of thoracic junction. Extensive erosive changes involving the T9, T10, and T11 vertebral bodies are again identified status post posterior thoracic fusion with instrumentation with bilateral pedicle screws at T10 and T12 and posterior volars as well as posterior decompression of T10. These findings are similar to that noted on prior CT examination of 03/30/2024. Evidence of increasing sclerosis at the T9-T10 interface in keeping with interval healing. IMPRESSION: 1. No pulmonary embolism. 2. Chronic upper and mid lung zone parenchymal changes been consistent  with underlying fibrotic interstitial lung disease , which may reflect chronic hypersensitivity pneumonitis or postinflammatory sarcoidosis. Recommend dedicated non-emergent high-resolution CT chest once acute issues have resolved. 3. Median sternotomy and extensive multivessel coronary artery calcifications. 4. Severe right hydronephrosis with marked right renal atrophy and moderate left renal parenchymal atrophy. Stable. 5. Advanced degenerative changes at the cervicothoracic junction and extensive erosive changes involving the T9, T10, and T11 vertebral bodies, status post posterior thoracic fusion with instrumentation and posterior decompression of T10, with interval healing at the T9-10 interface. Electronically signed by: Dorethia Molt MD 11/15/2024 09:33 PM EST RP Workstation: [id number]   DG Shoulder Right Result Date: 11/15/2024 EXAM: 1 VIEW(S) XRAY OF THE RIGHT SHOULDER 11/15/2024 05:24:00 PM COMPARISON: None available. CLINICAL HISTORY: Syncope at dialysis, hypoxia, chills, right shoulder pain. FINDINGS: LINES AND TUBES: Right IJ dialysis catheter in place. BONES AND JOINTS: Glenohumeral joint is normally aligned. No acute fracture. No malalignment. [facility] joint shows mild degenerative changes. Lower thoracic spinal fusion hardware noted. SOFT TISSUES: Soft tissue calcifications in right upper arm. Visualized lung is unremarkable. IMPRESSION: 1. No acute findings. 2. Mild degenerative changes at the acromioclavicular joint. Electronically signed by: Oneil Devonshire MD 11/15/2024 06:01 PM EST RP Workstation: [id number]   DG Chest Portable 1 View Result Date: 11/15/2024 EXAM: 1 VIEW(S) XRAY OF THE CHEST 11/15/2024 05:24:00 PM COMPARISON: 09/22/2024 CLINICAL HISTORY: Syncope at dialysis, hypoxia, chills, right shoulder pain. FINDINGS: LINES, TUBES AND DEVICES: Right dialysis catheter in place. LUNGS AND PLEURA: Chronic interstitial thickening and increased interstitial markings, most pronounced in right  upper lobe, similar to prior exams. Mild pulmonary edema. No focal pulmonary opacity. No pleural effusion. No pneumothorax. HEART AND MEDIASTINUM: Stable cardiomediastinal silhouette with cardiomegaly. Aortic atherosclerosis. BONES AND SOFT TISSUES: Prior median sternotomy noted. Lower thoracic fusion hardware noted. No acute osseous abnormality. IMPRESSION: 1. Mild pulmonary edema. 2. Stable cardiomegaly. 3. Right dialysis catheter in place. Electronically signed by: Oneil Devonshire MD 11/15/2024 05:59 PM EST RP Workstation: [id number]    Dialysis Orders: [facility]: [name]  on TTS . 180NRe 4hr BFR 400 DFR 800 EDW 91kg 2K 2.5Ca TDC No heparin  bolus   Assessment/Plan:  Syncope: syncopal episode during dialysis. Has not been reaching his EDW, likely  needs to be raised. CT with interstitial lung disease but no volume overload. No UF with HD tomorrow. Consider midodrine  if BP continues to drop on HD  ESRD:  TTS scheule, no indication for HD today, next HD tomorrow  Hypertension/volume: See above, will plan to raise EDW  Anemia: Hgb 7.1, has been in low 7's outpatient. ESA ordered but has not been given since 10/19/24, unsure if he has been refusing? Will order aranesp  here  Metabolic bone disease: Calcium  controlled, follow phos. Resumed calcitriol   Nutrition:  Will need renal diet and fluid restrictions  Lucie Collet, PA-C 11/16/2024, 8:32 AM  [facility] Pager: [phone]     [1]  Allergies Allergen Reactions   Other Other (See Comments)    Blood pressure issues Per [facility] hospital: Pt states he can only take these pain meds or else he gets very sick: Oxycontin ,morphine ,demerol, and dilaudid  are the only pain meds pt states he can take.     Oxymorphone Other (See Comments)    Causes kidney problems   Amitriptyline Other (See Comments)   Aspirin Nausea And Vomiting    Gi upset, has kidney issues   Beta Vulgaris Nausea And Vomiting   Buspirone Other (See  Comments)    Affected his head    Cabbage Nausea And Vomiting   Codeine Other (See Comments)    Pt does not remember the reaction but knows he can not take    Cyclobenzaprine Nausea And Vomiting   Diflunisal Nausea And Vomiting and Other (See Comments)   Fish Allergy Nausea And Vomiting   Fish Protein-Containing Drug Products Nausea And Vomiting   Hydroxyzine Nausea And Vomiting   Levetiracetam Other (See Comments)   Methadone Other (See Comments)    Made him loopy   Nalbuphine Other (See Comments)    Cutaneous eruption (morphologic abnormality)   Pentazocine Other (See Comments)    Pt does not remember the reaction    Propoxyphene Other (See Comments)    Pt does not remember the reaction     Shellfish Allergy Nausea And Vomiting   Sulfa Antibiotics Hives   Sulfasalazine Other (See Comments)    Pt does not remember the reaction   Amoxicillin  Nausea And Vomiting and Rash    Per [facility]

## 2024-11-16 NOTE — Progress Notes (Signed)
 Pt receives out-pt HD at [facility] on TTS 11:15 am chair time. Will assist as needed.   Randine Mungo Dialysis Navigator [phone]

## 2024-11-16 NOTE — TOC CM/SW Note (Signed)
 SDOH addressed and resources placed on AVS

## 2024-11-16 NOTE — H&P (Addendum)
 " History and Physical    Thomas [name] FMW:[id number] DOB: December 03, 1950 DOA: 11/15/2024  PCP: Patient, No Pcp Per   Patient coming from: SNF   Chief Complaint: LOC   HPI: Thomas [name] is a 74 y.o. male with medical history significant for history of DVT and PE with IVC filter, hemorrhagic shock in September 2025, diet-controlled diabetes mellitus, chronic back pain, chronic anemia, and ESRD on hemodialysis who presents after losing consciousness at dialysis.  Patient reports that he was in his usual state of health when he went to dialysis and then remembers being woken up and told that he was being sent to the ED.  He had reportedly completed 3 hours of his dialysis where the LOC occurred.  He appeared to be apneic per report and was given Narcan  by EMS.  There was no report of shaking, incontinence, or tongue bite. He complains of chronic back pain in the ED but denies any recent chest pain, palpitations, or change in his chronic cough.  He was having pain in the right shoulder earlier.  ED Course: Upon arrival to the ED, patient is found to be afebrile and saturating well on 2 L pulmonary supplemental oxygen with normal RR, normal HR, and SBP 87 and greater.  Labs are most notable for normal potassium, normal CO2, BUN 37, normal WBC, normal lactic acid, hemoglobin 7.0, and troponin 88.  CTA chest is negative for PE but notable for chronic changes suspicious for fibrotic interstitial lung disease.  Review of Systems:  All other systems reviewed and apart from HPI, are negative.  Past Medical History:  Diagnosis Date   Arthritis    BPH (benign prostatic hyperplasia) 11/24/2019   Chronic pain syndrome 11/24/2019   Diabetes mellitus without complication (HCC)    ESRD (end stage renal disease) (HCC)    on HD   Gout    Hypertension    Muscle pain    Physical deconditioning 11/24/2019   Scars    Swelling     Past Surgical History:  Procedure Laterality Date   AMPUTATION Left  02/17/2024   Procedure: AMPUTATION, FOOT, PARTIAL;  Surgeon: Harden Jerona GAILS, MD;  Location: [facility] OR;  Service: Orthopedics;  Laterality: Left;  LEFT TRANSMETATARSAL AMPUTATION   BELOW KNEE LEG AMPUTATION Right    ESOPHAGOGASTRODUODENOSCOPY (EGD) WITH PROPOFOL  N/A 11/25/2019   Procedure: ESOPHAGOGASTRODUODENOSCOPY (EGD) WITH PROPOFOL ;  Surgeon: Rollin Dover, MD;  Location: [facility] ENDOSCOPY;  Service: Endoscopy;  Laterality: N/A;   IR FLUORO GUIDE CV LINE RIGHT  09/05/2019   IR LUMBAR DISC ASPIRATION W/IMG GUIDE  11/26/2019   IR US  GUIDE VASC ACCESS RIGHT  09/05/2019   RADIOLOGY WITH ANESTHESIA N/A 11/25/2019   Procedure: Thoracic and Lumbar MRI WITH ANESTHESIA;  Surgeon: Radiologist, Medication, MD;  Location: [facility] OR;  Service: Radiology;  Laterality: N/A;   TUNNELLED CATHETER EXCHANGE N/A 07/22/2024   Procedure: TUNNELLED CATHETER EXCHANGE;  Surgeon: Gretta Lonni PARAS, MD;  Location: [facility];  Service: Cardiovascular;  Laterality: N/A;    Social History:   reports that he has never smoked. He has never been exposed to tobacco smoke. He has never used smokeless tobacco. He reports that he does not drink alcohol and does not use drugs.  Allergies[1]  Family History  Problem Relation Age of Onset   Heart disease Mother    Cancer Father      Prior to Admission medications  Medication Sig Start Date End Date Taking? Authorizing Provider  apixaban  (ELIQUIS ) 5 MG TABS  tablet Take 1 tablet (5 mg total) by mouth 2 (two) times daily. 03/15/24   Samtani, Jai-Gurmukh, MD  atorvastatin  (LIPITOR ) 80 MG tablet Take 80 mg by mouth at bedtime.    [provider]  bisacodyl  (DULCOLAX) 10 MG suppository Place 1 suppository (10 mg total) rectally daily as needed for moderate constipation. 03/15/24   Samtani, Jai-Gurmukh, MD  Cholecalciferol  125 MCG (5000 UT) capsule Take 5,000 Units by mouth daily.    [provider]  cyanocobalamin  (VITAMIN B12) 1000 MCG tablet Take 1 tablet by mouth daily.     [provider]  HYDROmorphone  (DILAUDID ) 4 MG tablet Take 4 mg by mouth See admin instructions. Give one tablet by mouth in the morning on Monday, Wednesdays and Fridays dialysis days only    [provider]  lactulose  (CHRONULAC ) 10 GM/15ML solution Take 45 mLs (30 g total) by mouth daily. 03/15/24   Samtani, Jai-Gurmukh, MD  levothyroxine  (SYNTHROID ) 50 MCG tablet Take 50 mcg by mouth daily before breakfast.     [provider]  methocarbamol  (ROBAXIN ) 500 MG tablet Take 500 mg by mouth every 6 (six) hours as needed for muscle spasms. 05/25/24   [provider]  metoprolol  succinate (TOPROL -XL) 100 MG 24 hr tablet Take 100 mg by mouth daily. Take with or immediately following a meal.    [provider]  midodrine  (PROAMATINE ) 5 MG tablet Take 1 tablet (5 mg total) by mouth 3 (three) times daily with meals. 03/15/24   Samtani, Jai-Gurmukh, MD  Nutritional Supplements (NEPRO PO) Take 240 mLs by mouth 2 (two) times daily.    [provider]  ondansetron  (ZOFRAN ) 4 MG tablet Take 4 mg by mouth every 4 (four) hours as needed for nausea or vomiting.    [provider]  oxyCODONE  (OXYCONTIN ) 20 mg 12 hr tablet Take 20 mg by mouth every 12 (twelve) hours.    [provider]  oxyCODONE -acetaminophen  (PERCOCET/ROXICET) 5-325 MG tablet Take 1 tablet by mouth every 6 (six) hours as needed for severe pain (pain score 7-10). 08/23/24   Zackowski, Scott, MD  REXULTI  1 MG TABS tablet Take 1 mg by mouth daily. 06/01/24   [provider]  senna-docusate (SENOKOT-S) 8.6-50 MG tablet Take 4 tablets by mouth 2 (two) times daily. 11/04/22   [provider]  sevelamer  (RENAGEL ) 800 MG tablet Take 1,600 mg by mouth 3 (three) times daily. 08/27/23   [provider]  sevelamer  carbonate (RENVELA ) 800 MG tablet Take 2,400 mg by mouth 3 (three) times daily with meals. 05/19/24   [provider]  sodium zirconium cyclosilicate   (LOKELMA ) 10 g PACK packet Take 10 g by mouth 2 (two) times daily. 03/30/24   Armenta Canning, MD  tamsulosin  (FLOMAX ) 0.4 MG CAPS capsule Take 1 capsule (0.4 mg total) by mouth daily. Patient taking differently: Take 0.4 mg by mouth every evening. 09/25/20   Arnett Saunders, MD  tiZANidine  (ZANAFLEX ) 4 MG tablet Take 4 mg by mouth every 8 (eight) hours as needed for muscle spasms. 11/26/21   [provider]  torsemide (DEMADEX) 20 MG tablet Take 20 mg by mouth 2 (two) times daily.    [provider]  Vitamin E 670 MG (1000 UT) CAPS Take 1,000 mg by mouth daily.    [provider]    Physical Exam: Vitals:   11/15/24 2145 11/15/24 2230 11/15/24 2303 11/16/24 0027  BP: 117/75 120/74 109/70 122/74  Pulse: 72 62 72 75  Resp: 13 17 16  16  Temp:   98 F (36.7 C) 98.2 F (36.8 C)  TempSrc:   Oral Oral  SpO2: 100% 98% 97% 92%  Weight:    91.3 kg  Height:    5' 10 (1.778 m)    Constitutional: NAD, no diaphoresis   Eyes: PERTLA, lids and conjunctivae normal ENMT: Mucous membranes are moist. Posterior pharynx clear of any exudate or lesions.   Neck: supple, no masses  Respiratory: Speaking in full sentences. No wheezing.  Cardiovascular: S1 & S2 heard, regular rate and rhythm. No extremity edema. Abdomen: No tenderness, soft. Bowel sounds active.  Musculoskeletal: no clubbing / cyanosis. S/p right BKA and left TMA.   Skin: no significant rashes, lesions, ulcers. Warm, dry, well-perfused. Neurologic: CN 2-12 grossly intact. Moving all extremities. Alert and oriented.  Psychiatric: Calm. Cooperative.    Labs and Imaging on Admission: I have personally reviewed following labs and imaging studies  CBC: Recent Labs  Lab 11/15/24 1852 11/15/24 1953  WBC 5.5  --   NEUTROABS 3.8  --   HGB 7.0* 6.5*  7.1*  HCT 21.4* 19.0*  21.0*  MCV 108.1*  --   PLT 151  --    Basic Metabolic Panel: Recent Labs  Lab 11/15/24 1852 11/15/24 1953  NA 136 136  137  K 4.3  4.3  4.3  CL 96* 97*  CO2 27  --   GLUCOSE 91 92  BUN 37* 35*  CREATININE 6.99* 8.30*  CALCIUM  8.2*  --   MG 2.4  --    GFR: Estimated Creatinine Clearance: 9.3 mL/min (A) (by C-G formula based on SCr of 8.3 mg/dL (H)). Liver Function Tests: Recent Labs  Lab 11/15/24 1852  AST 18  ALT 7  ALKPHOS 76  BILITOT 0.3  PROT 7.7  ALBUMIN  3.8   No results for input(s): LIPASE, AMYLASE in the last 168 hours. No results for input(s): AMMONIA in the last 168 hours. Coagulation Profile: No results for input(s): INR, PROTIME in the last 168 hours. Cardiac Enzymes: No results for input(s): CKTOTAL, CKMB, CKMBINDEX, TROPONINI in the last 168 hours. BNP (last 3 results) No results for input(s): PROBNP in the last 8760 hours. HbA1C: No results for input(s): HGBA1C in the last 72 hours. CBG: No results for input(s): GLUCAP in the last 168 hours. Lipid Profile: No results for input(s): CHOL, HDL, LDLCALC, TRIG, CHOLHDL, LDLDIRECT in the last 72 hours. Thyroid Function Tests: Recent Labs    11/15/24 1852  TSH 2.380   Anemia Panel: No results for input(s): VITAMINB12, FOLATE, FERRITIN, TIBC, IRON , RETICCTPCT in the last 72 hours. Urine analysis:    Component Value Date/Time   COLORURINE YELLOW 01/14/2022 0248   APPEARANCEUR HAZY (A) 01/14/2022 0248   LABSPEC 1.009 01/14/2022 0248   PHURINE 7.0 01/14/2022 0248   GLUCOSEU NEGATIVE 01/14/2022 0248   HGBUR MODERATE (A) 01/14/2022 0248   BILIRUBINUR NEGATIVE 01/14/2022 0248   KETONESUR NEGATIVE 01/14/2022 0248   PROTEINUR 100 (A) 01/14/2022 0248   NITRITE NEGATIVE 01/14/2022 0248   LEUKOCYTESUR LARGE (A) 01/14/2022 0248   Sepsis Labs: @LABRCNTIP (procalcitonin:4,lacticidven:4) ) Recent Results (from the past 240 hours)  Resp panel by RT-PCR (RSV, Flu A&B, Covid) Anterior Nasal Swab     Status: None   Collection Time: 11/15/24  5:08 PM   Specimen: Anterior Nasal Swab  Result Value  Ref Range Status   SARS Coronavirus 2 by RT PCR NEGATIVE NEGATIVE Final   Influenza A by PCR NEGATIVE NEGATIVE Final   Influenza B  by PCR NEGATIVE NEGATIVE Final    Comment: (NOTE) The Xpert Xpress SARS-CoV-2/FLU/RSV plus assay is intended as an aid in the diagnosis of influenza from Nasopharyngeal swab specimens and should not be used as a sole basis for treatment. Nasal washings and aspirates are unacceptable for Xpert Xpress SARS-CoV-2/FLU/RSV testing.  Fact Sheet for Patients: bloggercourse.com  Fact Sheet for Healthcare Providers: seriousbroker.it  This test is not yet approved or cleared by the [place]  FDA and has been authorized for detection and/or diagnosis of SARS-CoV-2 by FDA under an Emergency Use Authorization (EUA). This EUA will remain in effect (meaning this test can be used) for the duration of the COVID-19 declaration under Section 564(b)(1) of the Act, 21 U.S.C. section 360bbb-3(b)(1), unless the authorization is terminated or revoked.     Resp Syncytial Virus by PCR NEGATIVE NEGATIVE Final    Comment: (NOTE) Fact Sheet for Patients: bloggercourse.com  Fact Sheet for Healthcare Providers: seriousbroker.it  This test is not yet approved or cleared by the [place]  FDA and has been authorized for detection and/or diagnosis of SARS-CoV-2 by FDA under an Emergency Use Authorization (EUA). This EUA will remain in effect (meaning this test can be used) for the duration of the COVID-19 declaration under Section 564(b)(1) of the Act, 21 U.S.C. section 360bbb-3(b)(1), unless the authorization is terminated or revoked.  Performed at [facility], [address], [place], [place] [address]      Radiological Exams on Admission: CT Angio Chest PE W and/or Wo Contrast Result Date: 11/15/2024 EXAM: CTA of the Chest without and with contrast for PE  11/15/2024 09:00:00 PM TECHNIQUE: CTA of the chest was performed without and with the administration of 60 mL of iohexol  (OMNIPAQUE ) 350 MG/ML injection. Multiplanar reformatted images are provided for review. MIP images are provided for review. Automated exposure control, iterative reconstruction, and/or weight based adjustment of the mA/kV was utilized to reduce the radiation dose to as low as reasonably achievable. COMPARISON: CT examination of 03/30/2024. CLINICAL HISTORY: High probability for pulmonary embolism. History of pulmonary emboli, hypoxia, hypotension, tachycardia, and unresponsiveness at dialysis. FINDINGS: PULMONARY ARTERIES: Pulmonary arteries are adequately opacified for evaluation. No pulmonary embolism. Central pulmonary arteries are of normal caliber. MEDIASTINUM: Median sternotomy has been performed. Extensive multivessel coronary artery calcifications noted predominantly within the left anterior descending coronary artery. Global cardiac size within normal limits. No pericardial effusion. Right internal jugular hemodialysis catheter tip seen within the right atrium. Mild atherosclerotic calcification within the thoracic aorta. No aortic aneurysm. LYMPH NODES: No mediastinal, hilar or axillary lymphadenopathy. LUNGS AND PLEURA: Disease or again identified with architectural distortion, reticulation, and volume loss predominantly within the upper and mid lung zones. The findings are not optimally assessed on this examination but may reflect changes of chronic hypersensitivity pneumonitis or postinflammatory changes of sarcoidosis. These findings will be better assessed with dedicated non-emergent high-resolution CT examination of the chest once the patient's acute issues have resolved. No pleural effusion or pneumothorax. UPPER ABDOMEN: Severe right hydronephrosis with marked atrophy of the right kidney and moderate parenchymal atrophy of the left kidney again noted within the visualized upper  abdomen. No acute abnormality. SOFT TISSUES AND BONES: Median sternotomy has been performed. Advanced degenerative changes are again identified at the circ of thoracic junction. Extensive erosive changes involving the T9, T10, and T11 vertebral bodies are again identified status post posterior thoracic fusion with instrumentation with bilateral pedicle screws at T10 and T12 and posterior volars as well as posterior decompression of  T10. These findings are similar to that noted on prior CT examination of 03/30/2024. Evidence of increasing sclerosis at the T9-T10 interface in keeping with interval healing. IMPRESSION: 1. No pulmonary embolism. 2. Chronic upper and mid lung zone parenchymal changes been consistent with underlying fibrotic interstitial lung disease , which may reflect chronic hypersensitivity pneumonitis or postinflammatory sarcoidosis. Recommend dedicated non-emergent high-resolution CT chest once acute issues have resolved. 3. Median sternotomy and extensive multivessel coronary artery calcifications. 4. Severe right hydronephrosis with marked right renal atrophy and moderate left renal parenchymal atrophy. Stable. 5. Advanced degenerative changes at the cervicothoracic junction and extensive erosive changes involving the T9, T10, and T11 vertebral bodies, status post posterior thoracic fusion with instrumentation and posterior decompression of T10, with interval healing at the T9-10 interface. Electronically signed by: Dorethia Molt MD 11/15/2024 09:33 PM EST RP Workstation: [id number]   DG Shoulder Right Result Date: 11/15/2024 EXAM: 1 VIEW(S) XRAY OF THE RIGHT SHOULDER 11/15/2024 05:24:00 PM COMPARISON: None available. CLINICAL HISTORY: Syncope at dialysis, hypoxia, chills, right shoulder pain. FINDINGS: LINES AND TUBES: Right IJ dialysis catheter in place. BONES AND JOINTS: Glenohumeral joint is normally aligned. No acute fracture. No malalignment. The [facility] joint shows mild degenerative changes.  Lower thoracic spinal fusion hardware noted. SOFT TISSUES: Soft tissue calcifications in right upper arm. Visualized lung is unremarkable. IMPRESSION: 1. No acute findings. 2. Mild degenerative changes at the acromioclavicular joint. Electronically signed by: Oneil Devonshire MD 11/15/2024 06:01 PM EST RP Workstation: [id number]   DG Chest Portable 1 View Result Date: 11/15/2024 EXAM: 1 VIEW(S) XRAY OF THE CHEST 11/15/2024 05:24:00 PM COMPARISON: 09/22/2024 CLINICAL HISTORY: Syncope at dialysis, hypoxia, chills, right shoulder pain. FINDINGS: LINES, TUBES AND DEVICES: Right dialysis catheter in place. LUNGS AND PLEURA: Chronic interstitial thickening and increased interstitial markings, most pronounced in right upper lobe, similar to prior exams. Mild pulmonary edema. No focal pulmonary opacity. No pleural effusion. No pneumothorax. HEART AND MEDIASTINUM: Stable cardiomediastinal silhouette with cardiomegaly. Aortic atherosclerosis. BONES AND SOFT TISSUES: Prior median sternotomy noted. Lower thoracic fusion hardware noted. No acute osseous abnormality. IMPRESSION: 1. Mild pulmonary edema. 2. Stable cardiomegaly. 3. Right dialysis catheter in place. Electronically signed by: Oneil Devonshire MD 11/15/2024 05:59 PM EST RP Workstation: [id number]    EKG: Independently reviewed. Sinus rhythm, PACs.   Assessment/Plan   1. Syncope  - Continue cardiac monitoring, check orthostatic vitals, echocardiogram    2. Acute hypoxic respiratory failure - He is requiring supplemental O2 in ED, denies change in chronic cough or dyspnea, and has no PE on CTA chest though findings suggest fibrotic ILD  - Start incentive spirometry, continue supplemental O2 as-needed, outpatient pulmonology follow-up recommended    3. ESRD  - Reportedly completed 3 hours of HD just prior to arrival - Restrict fluids, renally-dose medications    4. Anemia  - Hgb 7.0 in ED, was 7.1 on 09/22/24  - No overt bleeding, he is okay with transfusion  if needed  - Type and screen, repeat CBC in am, transfuse if needed   5. Hx of DVT and PE  - Has IVC filter and Eliquis  was stopped in September 2025 when he was admitted to another hospital with hemorrhagic shock   - No PE on CTA in ED    DVT prophylaxis: sq heparin   Code Status: Full  Level of Care: Level of care: Progressive Family Communication: None present  Disposition Plan:  Patient is from: SNF  Anticipated d/c is to: SNF  Anticipated d/c date is: 1/28  or 11/17/24  Patient currently: Pending syncope workup, may need home O2  Consults called: None  Admission status: Observation     Evalene GORMAN Sprinkles, MD [facility]  11/16/2024, 1:18 AM       [1]  Allergies Allergen Reactions   Other Other (See Comments)    Blood pressure issues Per [facility]: Pt states he can only take these pain meds or else he gets very sick: Oxycontin ,morphine ,demerol, and dilaudid  are the only pain meds pt states he can take.     Oxymorphone Other (See Comments)    Causes kidney problems   Amitriptyline Other (See Comments)   Aspirin Nausea And Vomiting    Gi upset, has kidney issues   Beta Vulgaris Nausea And Vomiting   Buspirone Other (See Comments)    Affected his head    Cabbage Nausea And Vomiting   Codeine Other (See Comments)    Pt does not remember the reaction but knows he can not take    Cyclobenzaprine Nausea And Vomiting   Diflunisal Nausea And Vomiting and Other (See Comments)   Fish Allergy Nausea And Vomiting   Fish Protein-Containing Drug Products Nausea And Vomiting   Hydroxyzine Nausea And Vomiting   Levetiracetam Other (See Comments)   Methadone Other (See Comments)    Made him loopy   Nalbuphine Other (See Comments)    Cutaneous eruption (morphologic abnormality)   Pentazocine Other (See Comments)    Pt does not remember the reaction    Propoxyphene Other (See Comments)    Pt does not remember the reaction     Shellfish Allergy Nausea And  Vomiting   Sulfa Antibiotics Hives   Sulfasalazine Other (See Comments)    Pt does not remember the reaction   Amoxicillin  Nausea And Vomiting and Rash    Per [facility]   "

## 2024-11-16 NOTE — Progress Notes (Signed)
 "  PROGRESS NOTE  Thomas [name] FMW:[id number] DOB: 07/10/1951 DOA: 11/15/2024 PCP: Patient, No Pcp Per  HPI/Recap of past 24 hours: Thomas [name] is a 74 y.o. male with medical history significant for history of DVT and PE with IVC filter, hemorrhagic shock in September 2025, diet-controlled diabetes mellitus, chronic back pain, chronic anemia, and ESRD on hemodialysis who presents after losing consciousness at dialysis. Patient reports that he was in his usual state of health when he went to dialysis and then remembers being woken up and told that he was being sent to the ED.  He had reportedly completed 3 hours of his dialysis when the LOC occurred.  He appeared to be apneic per report and was bagged and given Narcan  by EMS.  There was no report of shaking, incontinence, or tongue bite. He complains of chronic back pain in the ED. In the ED, patient afebrile and saturating well on 2 L with normal RR, normal HR, and SBP 80s-90s. Labs are most notable hemoglobin 7.0, and troponin 88.  CTA chest is negative for PE but notable for chronic changes suspicious for fibrotic interstitial lung disease.  Patient admitted for further management.     Today, patient's meds sleeping in bed, was arousable, but appeared somewhat drowsy.  Oriented to name, place, date of birth.  Patient still reports chronic back pain, denies any chest pain, shortness of breath, abdominal pain, nausea/vomiting, fever/chills.    Assessment/Plan: Principal Problem:   Syncope Active Problems:   DM2 (diabetes mellitus, type 2) (HCC)   Acquired hypothyroidism   Chronic pain syndrome   End stage renal disease on dialysis (HCC)   History of pulmonary embolus (PE)   Macrocytic anemia   ILD (interstitial lung disease) (HCC)    Syncope Likely 2/2 possible opioid misuse/polypharmacy versus hypotension Appears drowsy, but able to hold on a conversation, oriented to person/place Patient on oxycodone  20 mg every 12 hours,  Percocet as needed, Robaxin  (was also previously on Dilaudid  p.o., Zanaflex ) Required bagging and a dose of Narcan  by EMS in the dialysis unit prior to arrival Currently saturating well on room air CTA chest negative for any PE, noted changes consistent with underlying possible fibrotic interstitial lung disease, recommend nonemergent high-resolution CT chest CT head pending Echo pending Telemetry, monitor closely Adjust pain medicines as possible, hold BP meds  Hypotension Afebrile, with no leukocytosis BC x 2 pending Lactic acid WNL Appears chronic, on midodrine  as needed, if persistently low, will resume Monitor BP closely, hold all BP meds (clonidine , metoprolol , torsemide) Telemetry  Elevated troponin Noted in the ESRD patient, 87-->88 Flat trend, denies any chest pain Echo pending Telemetry  Acute hypoxic respiratory failure Required O2 supplementation in the ED CTA chest negative for any PE, noted changes consistent with underlying possible fibrotic interstitial lung disease, recommend nonemergent high-resolution CT chest Incentive spirometry, supplemental O2 as needed Outpatient pulmonology follow-up  Anemia of ESRD Macrocytic anemia Hemoglobin noted to be around 7 Denies any obvious signs of bleeding Anemia panel pending, type and screen done Hold Eliquis  for now given hemoglobin around 7 as well as hypotension, with history of hemorrhagic shock in September 2025 Nephrology on board Daily CBC  ESRD Nephrology on board, appreciate recs on T/TH/S schedule  History of DVT/PE Has IVC filter in place and as per chart review Eliquis  was stopped in September 2025 after he was admitted with hemorrhagic shock, will hold Eliquis  for now as it had been continued in his nursing home CTA negative for  PE  Chronic pain syndrome Patient on oxycodone  20 mg every 12 hours, Percocet as needed, Robaxin  (was also previously on Dilaudid  p.o., Zanaflex ) Required bagging and a dose of  Narcan  by EMS in the dialysis unit prior to arrival Continue oxycodone  5 mg every 6 hours as needed, Robaxin  for now Continue bowel regimen  Right BKA Left toe amputation PT/OT  Obesity class I Lifestyle modification advised      Estimated body mass index is 30.49 kg/m as calculated from the following:   Height as of this encounter: 5' 10 (1.778 m).   Weight as of this encounter: 96.4 kg.     Code Status: Full  Family Communication: None at bedside  Disposition Plan: Status is: Observation The patient remains OBS appropriate and will d/c before 2 midnights.      Consultants: Nephrology  Procedures: None  Antimicrobials: None  DVT prophylaxis: Heparin  Randall for now   Objective: Vitals:   11/15/24 2303 11/16/24 0027 11/16/24 0319 11/16/24 0808  BP: 109/70 122/74 (!) 86/54 (!) 95/56  Pulse: 72 75 79   Resp: 16 16 16 16   Temp: 98 F (36.7 C) 98.2 F (36.8 C) 98.6 F (37 C)   TempSrc: Oral Oral Oral Oral  SpO2: 97% 92% 90%   Weight:  91.3 kg 96.4 kg   Height:  5' 10 (1.778 m)     No intake or output data in the 24 hours ending 11/16/24 1124 Filed Weights   11/16/24 0027 11/16/24 0319  Weight: 91.3 kg 96.4 kg    Exam: General: NAD  Cardiovascular: S1, S2 present Respiratory: CTAB Abdomen: Soft, nontender, nondistended, bowel sounds present Musculoskeletal: No bilateral pedal edema noted, right BKA, left toe amputation Skin: Normal Psychiatry: Normal mood     Data Reviewed: CBC: Recent Labs  Lab 11/15/24 1852 11/15/24 1953 11/16/24 0406  WBC 5.5  --  6.2  NEUTROABS 3.8  --   --   HGB 7.0* 6.5*  7.1* 7.1*  HCT 21.4* 19.0*  21.0* 21.7*  MCV 108.1*  --  106.4*  PLT 151  --  153   Basic Metabolic Panel: Recent Labs  Lab 11/15/24 1852 11/15/24 1953 11/16/24 0406  NA 136 136  137 136  K 4.3 4.3  4.3 4.3  CL 96* 97* 96*  CO2 27  --  28  GLUCOSE 91 92 101*  BUN 37* 35* 41*  CREATININE 6.99* 8.30* 8.12*  CALCIUM  8.2*  --  8.1*   MG 2.4  --   --    GFR: Estimated Creatinine Clearance: 9.7 mL/min (A) (by C-G formula based on SCr of 8.12 mg/dL (H)). Liver Function Tests: Recent Labs  Lab 11/15/24 1852  AST 18  ALT 7  ALKPHOS 76  BILITOT 0.3  PROT 7.7  ALBUMIN  3.8   No results for input(s): LIPASE, AMYLASE in the last 168 hours. No results for input(s): AMMONIA in the last 168 hours. Coagulation Profile: No results for input(s): INR, PROTIME in the last 168 hours. Cardiac Enzymes: No results for input(s): CKTOTAL, CKMB, CKMBINDEX, TROPONINI in the last 168 hours. BNP (last 3 results) No results for input(s): PROBNP in the last 8760 hours. HbA1C: No results for input(s): HGBA1C in the last 72 hours. CBG: Recent Labs  Lab 11/16/24 0559  GLUCAP 84   Lipid Profile: No results for input(s): CHOL, HDL, LDLCALC, TRIG, CHOLHDL, LDLDIRECT in the last 72 hours. Thyroid Function Tests: Recent Labs    11/15/24 1852  TSH 2.380   Anemia  Panel: No results for input(s): VITAMINB12, FOLATE, FERRITIN, TIBC, IRON , RETICCTPCT in the last 72 hours. Urine analysis:    Component Value Date/Time   COLORURINE YELLOW 01/14/2022 0248   APPEARANCEUR HAZY (A) 01/14/2022 0248   LABSPEC 1.009 01/14/2022 0248   PHURINE 7.0 01/14/2022 0248   GLUCOSEU NEGATIVE 01/14/2022 0248   HGBUR MODERATE (A) 01/14/2022 0248   BILIRUBINUR NEGATIVE 01/14/2022 0248   KETONESUR NEGATIVE 01/14/2022 0248   PROTEINUR 100 (A) 01/14/2022 0248   NITRITE NEGATIVE 01/14/2022 0248   LEUKOCYTESUR LARGE (A) 01/14/2022 0248   Sepsis Labs: @LABRCNTIP (procalcitonin:4,lacticidven:4)  ) Recent Results (from the past 240 hours)  Resp panel by RT-PCR (RSV, Flu A&B, Covid) Anterior Nasal Swab     Status: None   Collection Time: 11/15/24  5:08 PM   Specimen: Anterior Nasal Swab  Result Value Ref Range Status   SARS Coronavirus 2 by RT PCR NEGATIVE NEGATIVE Final   Influenza A by PCR NEGATIVE NEGATIVE  Final   Influenza B by PCR NEGATIVE NEGATIVE Final    Comment: (NOTE) The Xpert Xpress SARS-CoV-2/FLU/RSV plus assay is intended as an aid in the diagnosis of influenza from Nasopharyngeal swab specimens and should not be used as a sole basis for treatment. Nasal washings and aspirates are unacceptable for Xpert Xpress SARS-CoV-2/FLU/RSV testing.  Fact Sheet for Patients: bloggercourse.com  Fact Sheet for Healthcare Providers: seriousbroker.it  This test is not yet approved or cleared by the [place]  FDA and has been authorized for detection and/or diagnosis of SARS-CoV-2 by FDA under an Emergency Use Authorization (EUA). This EUA will remain in effect (meaning this test can be used) for the duration of the COVID-19 declaration under Section 564(b)(1) of the Act, 21 U.S.C. section 360bbb-3(b)(1), unless the authorization is terminated or revoked.     Resp Syncytial Virus by PCR NEGATIVE NEGATIVE Final    Comment: (NOTE) Fact Sheet for Patients: bloggercourse.com  Fact Sheet for Healthcare Providers: seriousbroker.it  This test is not yet approved or cleared by the [place]  FDA and has been authorized for detection and/or diagnosis of SARS-CoV-2 by FDA under an Emergency Use Authorization (EUA). This EUA will remain in effect (meaning this test can be used) for the duration of the COVID-19 declaration under Section 564(b)(1) of the Act, 21 U.S.C. section 360bbb-3(b)(1), unless the authorization is terminated or revoked.  Performed at [facility], [address], [place], [place] [address]       Studies: CT Angio Chest PE W and/or Wo Contrast Result Date: 11/15/2024 EXAM: CTA of the Chest without and with contrast for PE 11/15/2024 09:00:00 PM TECHNIQUE: CTA of the chest was performed without and with the administration of 60 mL of iohexol  (OMNIPAQUE ) 350  MG/ML injection. Multiplanar reformatted images are provided for review. MIP images are provided for review. Automated exposure control, iterative reconstruction, and/or weight based adjustment of the mA/kV was utilized to reduce the radiation dose to as low as reasonably achievable. COMPARISON: CT examination of 03/30/2024. CLINICAL HISTORY: High probability for pulmonary embolism. History of pulmonary emboli, hypoxia, hypotension, tachycardia, and unresponsiveness at dialysis. FINDINGS: PULMONARY ARTERIES: Pulmonary arteries are adequately opacified for evaluation. No pulmonary embolism. Central pulmonary arteries are of normal caliber. MEDIASTINUM: Median sternotomy has been performed. Extensive multivessel coronary artery calcifications noted predominantly within the left anterior descending coronary artery. Global cardiac size within normal limits. No pericardial effusion. Right internal jugular hemodialysis catheter tip seen within the right atrium. Mild atherosclerotic calcification within the thoracic aorta. No aortic aneurysm.  LYMPH NODES: No mediastinal, hilar or axillary lymphadenopathy. LUNGS AND PLEURA: Disease or again identified with architectural distortion, reticulation, and volume loss predominantly within the upper and mid lung zones. The findings are not optimally assessed on this examination but may reflect changes of chronic hypersensitivity pneumonitis or postinflammatory changes of sarcoidosis. These findings will be better assessed with dedicated non-emergent high-resolution CT examination of the chest once the patient's acute issues have resolved. No pleural effusion or pneumothorax. UPPER ABDOMEN: Severe right hydronephrosis with marked atrophy of the right kidney and moderate parenchymal atrophy of the left kidney again noted within the visualized upper abdomen. No acute abnormality. SOFT TISSUES AND BONES: Median sternotomy has been performed. Advanced degenerative changes are again  identified at the circ of thoracic junction. Extensive erosive changes involving the T9, T10, and T11 vertebral bodies are again identified status post posterior thoracic fusion with instrumentation with bilateral pedicle screws at T10 and T12 and posterior volars as well as posterior decompression of T10. These findings are similar to that noted on prior CT examination of 03/30/2024. Evidence of increasing sclerosis at the T9-T10 interface in keeping with interval healing. IMPRESSION: 1. No pulmonary embolism. 2. Chronic upper and mid lung zone parenchymal changes been consistent with underlying fibrotic interstitial lung disease , which may reflect chronic hypersensitivity pneumonitis or postinflammatory sarcoidosis. Recommend dedicated non-emergent high-resolution CT chest once acute issues have resolved. 3. Median sternotomy and extensive multivessel coronary artery calcifications. 4. Severe right hydronephrosis with marked right renal atrophy and moderate left renal parenchymal atrophy. Stable. 5. Advanced degenerative changes at the cervicothoracic junction and extensive erosive changes involving the T9, T10, and T11 vertebral bodies, status post posterior thoracic fusion with instrumentation and posterior decompression of T10, with interval healing at the T9-10 interface. Electronically signed by: Dorethia Molt MD 11/15/2024 09:33 PM EST RP Workstation: [id number]   DG Shoulder Right Result Date: 11/15/2024 EXAM: 1 VIEW(S) XRAY OF THE RIGHT SHOULDER 11/15/2024 05:24:00 PM COMPARISON: None available. CLINICAL HISTORY: Syncope at dialysis, hypoxia, chills, right shoulder pain. FINDINGS: LINES AND TUBES: Right IJ dialysis catheter in place. BONES AND JOINTS: Glenohumeral joint is normally aligned. No acute fracture. No malalignment. [facility] joint shows mild degenerative changes. Lower thoracic spinal fusion hardware noted. SOFT TISSUES: Soft tissue calcifications in right upper arm. Visualized lung is  unremarkable. IMPRESSION: 1. No acute findings. 2. Mild degenerative changes at the acromioclavicular joint. Electronically signed by: Oneil Devonshire MD 11/15/2024 06:01 PM EST RP Workstation: [id number]   DG Chest Portable 1 View Result Date: 11/15/2024 EXAM: 1 VIEW(S) XRAY OF THE CHEST 11/15/2024 05:24:00 PM COMPARISON: 09/22/2024 CLINICAL HISTORY: Syncope at dialysis, hypoxia, chills, right shoulder pain. FINDINGS: LINES, TUBES AND DEVICES: Right dialysis catheter in place. LUNGS AND PLEURA: Chronic interstitial thickening and increased interstitial markings, most pronounced in right upper lobe, similar to prior exams. Mild pulmonary edema. No focal pulmonary opacity. No pleural effusion. No pneumothorax. HEART AND MEDIASTINUM: Stable cardiomediastinal silhouette with cardiomegaly. Aortic atherosclerosis. BONES AND SOFT TISSUES: Prior median sternotomy noted. Lower thoracic fusion hardware noted. No acute osseous abnormality. IMPRESSION: 1. Mild pulmonary edema. 2. Stable cardiomegaly. 3. Right dialysis catheter in place. Electronically signed by: Oneil Devonshire MD 11/15/2024 05:59 PM EST RP Workstation: [id number]    Scheduled Meds:  [START ON 11/17/2024] calcitRIOL   0.25 mcg Oral Q T,Th,Sat-1800   Chlorhexidine  Gluconate Cloth  6 each Topical Q0600   [START ON 11/17/2024] darbepoetin (ARANESP ) injection - DIALYSIS  100 mcg Subcutaneous Q Thu-1800   heparin   5,000 Units  Subcutaneous Q8H   sodium chloride  flush  3 mL Intravenous Q12H    Continuous Infusions:   LOS: 0 days     Lebron JINNY Cage, MD [facility]  If 7PM-7AM, please contact night-coverage [email or web address] 11/16/2024, 11:24 AM    "

## 2024-11-17 ENCOUNTER — Observation Stay (HOSPITAL_COMMUNITY)

## 2024-11-17 DIAGNOSIS — R55 Syncope and collapse: Secondary | ICD-10-CM | POA: Diagnosis not present

## 2024-11-17 LAB — CBC WITH DIFFERENTIAL/PLATELET
Abs Immature Granulocytes: 0.02 10*3/uL (ref 0.00–0.07)
Basophils Absolute: 0 10*3/uL (ref 0.0–0.1)
Basophils Relative: 1 %
Eosinophils Absolute: 0.2 10*3/uL (ref 0.0–0.5)
Eosinophils Relative: 4 %
HCT: 20.8 % — ABNORMAL LOW (ref 39.0–52.0)
Hemoglobin: 6.9 g/dL — CL (ref 13.0–17.0)
Immature Granulocytes: 0 %
Lymphocytes Relative: 27 %
Lymphs Abs: 1.5 10*3/uL (ref 0.7–4.0)
MCH: 35.6 pg — ABNORMAL HIGH (ref 26.0–34.0)
MCHC: 33.2 g/dL (ref 30.0–36.0)
MCV: 107.2 fL — ABNORMAL HIGH (ref 80.0–100.0)
Monocytes Absolute: 0.5 10*3/uL (ref 0.1–1.0)
Monocytes Relative: 9 %
Neutro Abs: 3.2 10*3/uL (ref 1.7–7.7)
Neutrophils Relative %: 59 %
Platelets: 138 10*3/uL — ABNORMAL LOW (ref 150–400)
RBC: 1.94 MIL/uL — ABNORMAL LOW (ref 4.22–5.81)
RDW: 14.6 % (ref 11.5–15.5)
WBC: 5.4 10*3/uL (ref 4.0–10.5)
nRBC: 0 % (ref 0.0–0.2)

## 2024-11-17 LAB — RENAL FUNCTION PANEL
Albumin: 3.6 g/dL (ref 3.5–5.0)
Anion gap: 13 (ref 5–15)
BUN: 55 mg/dL — ABNORMAL HIGH (ref 8–23)
CO2: 26 mmol/L (ref 22–32)
Calcium: 8 mg/dL — ABNORMAL LOW (ref 8.9–10.3)
Chloride: 97 mmol/L — ABNORMAL LOW (ref 98–111)
Creatinine, Ser: 9.43 mg/dL — ABNORMAL HIGH (ref 0.61–1.24)
GFR, Estimated: 5 mL/min — ABNORMAL LOW
Glucose, Bld: 79 mg/dL (ref 70–99)
Phosphorus: 5.7 mg/dL — ABNORMAL HIGH (ref 2.5–4.6)
Potassium: 5 mmol/L (ref 3.5–5.1)
Sodium: 135 mmol/L (ref 135–145)

## 2024-11-17 LAB — HEMOGLOBIN AND HEMATOCRIT, BLOOD
HCT: 24.5 % — ABNORMAL LOW (ref 39.0–52.0)
Hemoglobin: 8 g/dL — ABNORMAL LOW (ref 13.0–17.0)

## 2024-11-17 LAB — HEPATITIS B SURFACE ANTIBODY, QUANTITATIVE: Hep B S AB Quant (Post): <3.5 m[IU]/mL — ABNORMAL LOW

## 2024-11-17 LAB — PREPARE RBC (CROSSMATCH)

## 2024-11-17 MED ORDER — SODIUM CHLORIDE 0.9% IV SOLUTION: Freq: Once | INTRAVENOUS | Status: AC

## 2024-11-17 MED ORDER — HEPARIN SODIUM (PORCINE) 1000 UNIT/ML IJ SOLN
3200.0000 [IU] | Freq: Once | INTRAMUSCULAR | Status: AC
  Administered 2024-11-17: 3200 [IU]

## 2024-11-17 MED ORDER — HEPARIN SODIUM (PORCINE) 1000 UNIT/ML IJ SOLN
INTRAMUSCULAR | Status: AC
  Filled 2024-11-17: qty 4

## 2024-11-17 MED ORDER — ACETAMINOPHEN 325 MG PO TABS
ORAL_TABLET | ORAL | Status: AC
  Filled 2024-11-17: qty 2

## 2024-11-17 MED ORDER — OXYCODONE HCL 5 MG PO TABS
ORAL_TABLET | ORAL | Status: AC
  Filled 2024-11-17: qty 1

## 2024-11-17 NOTE — Progress Notes (Signed)
 "  PROGRESS NOTE  Thomas [name] FMW:[id number] DOB: 1951/06/29 DOA: 11/15/2024 PCP: Patient, No Pcp Per  HPI/Recap of past 24 hours: Thomas [name] is a 74 y.o. male with medical history significant for history of DVT and PE with IVC filter, hemorrhagic shock in September 2025, diet-controlled diabetes mellitus, chronic back pain, chronic anemia, and ESRD on hemodialysis who presents after losing consciousness at dialysis. Patient reports that he was in his usual state of health when he went to dialysis and then remembers being woken up and told that he was being sent to the ED.  He had reportedly completed 3 hours of his dialysis when the LOC occurred.  He appeared to be apneic per report and was bagged and given Narcan  by EMS.  There was no report of shaking, incontinence, or tongue bite. He complains of chronic back pain in the ED. In the ED, patient afebrile and saturating well on 2 L with normal RR, normal HR, and SBP 80s-90s. Labs are most notable hemoglobin 7.0, and troponin 88.  CTA chest is negative for PE but notable for chronic changes suspicious for fibrotic interstitial lung disease.  Patient admitted for further management.     Today, patient noted to be more awake, was able to carry out a meaningful conversation with me.  Continues to report chronic back pain   Assessment/Plan: Principal Problem:   Syncope Active Problems:   DM2 (diabetes mellitus, type 2) (HCC)   Acquired hypothyroidism   Chronic pain syndrome   End stage renal disease on dialysis (HCC)   History of pulmonary embolus (PE)   Macrocytic anemia   ILD (interstitial lung disease) (HCC)    Syncope Likely 2/2 possible opioid misuse/polypharmacy versus hypotension Patient on oxycodone  20 mg every 12 hours, Percocet as needed, Robaxin  (was also previously on Dilaudid  p.o., Zanaflex ) Required bagging and a dose of Narcan  by EMS in the dialysis unit prior to arrival Currently saturating well on room air CTA  chest negative for any PE, noted changes consistent with underlying possible fibrotic interstitial lung disease, recommend nonemergent high-resolution CT chest as outpatient CT head no acute intracranial abnormality Echo showed EF of 50 to 55%, no regional wall motion abnormality, grade 1 diastolic dysfunction Telemetry, monitor closely Adjust pain medicines as possible, hold BP meds  Hypotension Afebrile, with no leukocytosis BC x 2 NGTD Lactic acid WNL On midodrine  as needed, during dialysis Given history of discitis/osteomyelitis on 01/2024 of which he completed 6 weeks of IV antibiotics, will reimage his spine with CT thoracic/lumbar to rule out any source of infection given persistent back pain which could be chronic as well (of note patient is unable to tolerate MRI and had been told by his cardiologist not to get an MRI) Monitor BP closely, hold all BP meds (clonidine , metoprolol , torsemide) Telemetry  Elevated troponin Noted in the ESRD patient, 87-->88 Flat trend, denies any chest pain Echo noted as above Telemetry  Acute hypoxic respiratory failure Resolved Required O2 supplementation in the ED CTA chest negative for any PE, noted changes consistent with underlying possible fibrotic interstitial lung disease, recommend nonemergent high-resolution CT chest as outpatient Incentive spirometry, supplemental O2 as needed Outpatient pulmonology follow-up  Anemia of ESRD Macrocytic anemia Hemoglobin noted to be around 7, dropped to 6.9 on 1/29 Denies any obvious signs of bleeding Anemia panel showed iron  50, ferritin 764, vitamin B12-->1,779, folate 9.1 Transfused 1 unit of PRBC on 1/29 Hold Eliquis  for now, given history of hemorrhagic shock in September 2025  Nephrology on board Daily CBC  ESRD Nephrology on board, appreciate recs on T/TH/S schedule  History of DVT/PE Has IVC filter in place and as per chart review Eliquis  was stopped in September 2025 after he was  admitted with hemorrhagic shock, will hold Eliquis  for now as it had been continued in his nursing home CTA negative for PE  Chronic pain syndrome CT thoracic/lumbar spine pending Patient on oxycodone  20 mg every 12 hours, Percocet as needed, Robaxin  (was also previously on Dilaudid  p.o., Zanaflex ) Required bagging and a dose of Narcan  by EMS in the dialysis unit prior to arrival Continue oxycodone  5 mg every 6 hours as needed, Robaxin  for now Continue bowel regimen  Right BKA Left toe amputation PT/OT  Obesity class I Lifestyle modification advised    Estimated body mass index is 30.81 kg/m as calculated from the following:   Height as of this encounter: 5' 10 (1.778 m).   Weight as of this encounter: 97.4 kg.     Code Status: Full  Family Communication: None at bedside  Disposition Plan: Status is: Observation The patient remains OBS appropriate and will d/c before 2 midnights.      Consultants: Nephrology  Procedures: None  Antimicrobials: None  DVT prophylaxis: Heparin  [place] for now   Objective: Vitals:   11/17/24 1400 11/17/24 1430 11/17/24 1500 11/17/24 1530  BP: 133/86 (!) 144/80 (!) 144/82 (!) 139/90  Pulse: 74 75 72 72  Resp: 18 20 15 12   Temp:      TempSrc:      SpO2: 99% 95% 98% 97%  Weight:      Height:        Intake/Output Summary (Last 24 hours) at 11/17/2024 1554 Last data filed at 11/17/2024 9092 Gross per 24 hour  Intake 620 ml  Output 225 ml  Net 395 ml   Filed Weights   11/16/24 0319 11/17/24 0550 11/17/24 1314  Weight: 96.4 kg 95.1 kg 97.4 kg    Exam: General: NAD  Cardiovascular: S1, S2 present Respiratory: CTAB Abdomen: Soft, nontender, nondistended, bowel sounds present Musculoskeletal: No bilateral pedal edema noted, right BKA, left toes amputation Skin: Normal Psychiatry: Normal mood     Data Reviewed: CBC: Recent Labs  Lab 11/15/24 1852 11/15/24 1953 11/16/24 0406 11/17/24 0340 11/17/24 1315  WBC 5.5   --  6.2 5.4  --   NEUTROABS 3.8  --   --  3.2  --   HGB 7.0* 6.5*  7.1* 7.1* 6.9* 8.0*  HCT 21.4* 19.0*  21.0* 21.7* 20.8* 24.5*  MCV 108.1*  --  106.4* 107.2*  --   PLT 151  --  153 138*  --    Basic Metabolic Panel: Recent Labs  Lab 11/15/24 1852 11/15/24 1953 11/16/24 0406 11/17/24 0340  NA 136 136  137 136 135  K 4.3 4.3  4.3 4.3 5.0  CL 96* 97* 96* 97*  CO2 27  --  28 26  GLUCOSE 91 92 101* 79  BUN 37* 35* 41* 55*  CREATININE 6.99* 8.30* 8.12* 9.43*  CALCIUM  8.2*  --  8.1* 8.0*  MG 2.4  --   --   --   PHOS  --   --   --  5.7*   GFR: Estimated Creatinine Clearance: 8.4 mL/min (A) (by C-G formula based on SCr of 9.43 mg/dL (H)). Liver Function Tests: Recent Labs  Lab 11/15/24 1852 11/17/24 0340  AST 18  --   ALT 7  --   ALKPHOS 76  --  BILITOT 0.3  --   PROT 7.7  --   ALBUMIN  3.8 3.6   No results for input(s): LIPASE, AMYLASE in the last 168 hours. No results for input(s): AMMONIA in the last 168 hours. Coagulation Profile: No results for input(s): INR, PROTIME in the last 168 hours. Cardiac Enzymes: No results for input(s): CKTOTAL, CKMB, CKMBINDEX, TROPONINI in the last 168 hours. BNP (last 3 results) No results for input(s): PROBNP in the last 8760 hours. HbA1C: No results for input(s): HGBA1C in the last 72 hours. CBG: Recent Labs  Lab 11/16/24 0559  GLUCAP 84   Lipid Profile: No results for input(s): CHOL, HDL, LDLCALC, TRIG, CHOLHDL, LDLDIRECT in the last 72 hours. Thyroid Function Tests: Recent Labs    11/15/24 1852  TSH 2.380   Anemia Panel: Recent Labs    11/16/24 1431  VITAMINB12 1,779*  FOLATE 9.1  FERRITIN 764*  TIBC 186*  IRON  50   Urine analysis:    Component Value Date/Time   COLORURINE YELLOW 01/14/2022 0248   APPEARANCEUR HAZY (A) 01/14/2022 0248   LABSPEC 1.009 01/14/2022 0248   PHURINE 7.0 01/14/2022 0248   GLUCOSEU NEGATIVE 01/14/2022 0248   HGBUR MODERATE (A) 01/14/2022 0248    BILIRUBINUR NEGATIVE 01/14/2022 0248   KETONESUR NEGATIVE 01/14/2022 0248   PROTEINUR 100 (A) 01/14/2022 0248   NITRITE NEGATIVE 01/14/2022 0248   LEUKOCYTESUR LARGE (A) 01/14/2022 0248   Sepsis Labs: @LABRCNTIP (procalcitonin:4,lacticidven:4)  ) Recent Results (from the past 240 hours)  Resp panel by RT-PCR (RSV, Flu A&B, Covid) Anterior Nasal Swab     Status: None   Collection Time: 11/15/24  5:08 PM   Specimen: Anterior Nasal Swab  Result Value Ref Range Status   SARS Coronavirus 2 by RT PCR NEGATIVE NEGATIVE Final   Influenza A by PCR NEGATIVE NEGATIVE Final   Influenza B by PCR NEGATIVE NEGATIVE Final    Comment: (NOTE) The Xpert Xpress SARS-CoV-2/FLU/RSV plus assay is intended as an aid in the diagnosis of influenza from Nasopharyngeal swab specimens and should not be used as a sole basis for treatment. Nasal washings and aspirates are unacceptable for Xpert Xpress SARS-CoV-2/FLU/RSV testing.  Fact Sheet for Patients: bloggercourse.com  Fact Sheet for Healthcare Providers: seriousbroker.it  This test is not yet approved or cleared by the [place]  FDA and has been authorized for detection and/or diagnosis of SARS-CoV-2 by FDA under an Emergency Use Authorization (EUA). This EUA will remain in effect (meaning this test can be used) for the duration of the COVID-19 declaration under Section 564(b)(1) of the Act, 21 U.S.C. section 360bbb-3(b)(1), unless the authorization is terminated or revoked.     Resp Syncytial Virus by PCR NEGATIVE NEGATIVE Final    Comment: (NOTE) Fact Sheet for Patients: bloggercourse.com  Fact Sheet for Healthcare Providers: seriousbroker.it  This test is not yet approved or cleared by the [place]  FDA and has been authorized for detection and/or diagnosis of SARS-CoV-2 by FDA under an Emergency Use Authorization (EUA). This EUA  will remain in effect (meaning this test can be used) for the duration of the COVID-19 declaration under Section 564(b)(1) of the Act, 21 U.S.C. section 360bbb-3(b)(1), unless the authorization is terminated or revoked.  Performed at [facility], [address], [place], [place] [address]   Culture, blood (Routine X 2) w Reflex to ID Panel     Status: None (Preliminary result)   Collection Time: 11/16/24  2:31 PM   Specimen: BLOOD LEFT HAND  Result Value Ref Range  Status   Specimen Description BLOOD LEFT HAND  Final   Special Requests   Final    BOTTLES DRAWN AEROBIC AND ANAEROBIC Blood Culture adequate volume   Culture   Final    NO GROWTH < 24 HOURS Performed at [facility], [address] [place], [place] [address]    Report Status PENDING  Incomplete  Culture, blood (Routine X 2) w Reflex to ID Panel     Status: None (Preliminary result)   Collection Time: 11/16/24  2:31 PM   Specimen: BLOOD LEFT HAND  Result Value Ref Range Status   Specimen Description BLOOD LEFT HAND  Final   Special Requests   Final    BOTTLES DRAWN AEROBIC ONLY Blood Culture adequate volume   Culture   Final    NO GROWTH < 24 HOURS Performed at [facility], [address], [place], [place] [address]    Report Status PENDING  Incomplete      Studies: No results found.   Scheduled Meds:  atorvastatin   80 mg Oral QHS   bethanechol   10 mg Oral BID   brexpiprazole   1 mg Oral Daily   calcitRIOL   0.25 mcg Oral Q T,Th,Sat-1800   Chlorhexidine  Gluconate Cloth  6 each Topical Q0600   cyanocobalamin   1,000 mcg Oral Daily   darbepoetin (ARANESP ) injection - DIALYSIS  100 mcg Subcutaneous Q Thu-1800   heparin   5,000 Units Subcutaneous Q8H   heparin  sodium (porcine)  3,200 Units Intracatheter Once   levothyroxine   50 mcg Oral Q0600   pantoprazole   40 mg Oral Daily   senna-docusate  4 tablet Oral BID   sertraline   50 mg Oral Daily   sodium chloride  flush  3 mL Intravenous Q12H     Continuous Infusions:   LOS: 0 days     Lebron JINNY Cage, MD [facility]  If 7PM-7AM, please contact night-coverage [email or web address] 11/17/2024, 3:54 PM    "

## 2024-11-17 NOTE — Care Management Obs Status (Signed)
 MEDICARE OBSERVATION STATUS NOTIFICATION   Patient Details  Name: Thomas [name] MRN: [id number] Date of Birth: 12/13/1950   Medicare Observation Status Notification Given:  Yes    Vonzell Arrie Sharps 11/17/2024, 7:49 AM

## 2024-11-17 NOTE — Progress Notes (Signed)
 " [facility] Progress Note   Subjective:   Seen in room. BP actually high today. Denies SOB, CP, dizziness, nausea.   Objective Vitals:   11/17/24 0535 11/17/24 0550 11/17/24 0612 11/17/24 0907  BP: (!) 167/93  129/80 (!) 179/95  Pulse: 71  66 64  Resp: 14  16 12   Temp: 98.6 F (37 C)  98.4 F (36.9 C) 98.3 F (36.8 C)  TempSrc: Oral  Oral Oral  SpO2: 100%  94% 98%  Weight:  95.1 kg    Height:       Physical Exam General: Alert male in NAD Heart: RRR, no murmur Lungs: CTA bilaterally Abdomen: Soft, non-distended, +BS Extremities:no edema b/l lower extremities Dialysis Access: [facility]  Additional Objective Labs: Basic Metabolic Panel: Recent Labs  Lab 11/15/24 1852 11/15/24 1953 11/16/24 0406 11/17/24 0340  NA 136 136  137 136 135  K 4.3 4.3  4.3 4.3 5.0  CL 96* 97* 96* 97*  CO2 27  --  28 26  GLUCOSE 91 92 101* 79  BUN 37* 35* 41* 55*  CREATININE 6.99* 8.30* 8.12* 9.43*  CALCIUM  8.2*  --  8.1* 8.0*  PHOS  --   --   --  5.7*   Liver Function Tests: Recent Labs  Lab 11/15/24 1852 11/17/24 0340  AST 18  --   ALT 7  --   ALKPHOS 76  --   BILITOT 0.3  --   PROT 7.7  --   ALBUMIN  3.8 3.6   No results for input(s): LIPASE, AMYLASE in the last 168 hours. CBC: Recent Labs  Lab 11/15/24 1852 11/15/24 1953 11/16/24 0406 11/17/24 0340  WBC 5.5  --  6.2 5.4  NEUTROABS 3.8  --   --  3.2  HGB 7.0* 6.5*  7.1* 7.1* 6.9*  HCT 21.4* 19.0*  21.0* 21.7* 20.8*  MCV 108.1*  --  106.4* 107.2*  PLT 151  --  153 138*   Blood Culture    Component Value Date/Time   SDES BLOOD LEFT HAND 11/16/2024 1431   SDES BLOOD LEFT HAND 11/16/2024 1431   SPECREQUEST  11/16/2024 1431    BOTTLES DRAWN AEROBIC AND ANAEROBIC Blood Culture adequate volume   SPECREQUEST  11/16/2024 1431    BOTTLES DRAWN AEROBIC ONLY Blood Culture adequate volume   CULT  11/16/2024 1431    NO GROWTH < 24 HOURS Performed at [facility], [name] [place],  [place] [address]    CULT  11/16/2024 1431    NO GROWTH < 24 HOURS Performed at [facility], [address], [place], [place] [address]    REPTSTATUS PENDING 11/16/2024 1431   REPTSTATUS PENDING 11/16/2024 1431    Cardiac Enzymes: No results for input(s): CKTOTAL, CKMB, CKMBINDEX, TROPONINI in the last 168 hours. CBG: Recent Labs  Lab 11/16/24 0559  GLUCAP 84   Iron  Studies:  Recent Labs    11/16/24 1431  IRON  50  TIBC 186*  FERRITIN 764*   @lablastinr3 @ Studies/Results: ECHOCARDIOGRAM COMPLETE Result Date: 11/16/2024    ECHOCARDIOGRAM REPORT   Patient Name:   Thomas [name] Date of Exam: 11/16/2024 Medical Rec #:  [id number]       Height:       70.0 in Accession #:    [id number]      Weight:       212.5 lb Date of Birth:  Mar 10, 1951       BSA:  2.142 m Patient Age:    74 years        BP:           95/54 mmHg Patient Gender: M               HR:           65 bpm. Exam Location:  Inpatient Procedure: 2D Echo, Cardiac Doppler, Color Doppler and Intracardiac            Opacification Agent (Both Spectral and Color Flow Doppler were            utilized during procedure). Indications:    Syncope  History:        Patient has prior history of Echocardiogram examinations, most                 recent 03/10/2024. Risk Factors:Diabetes and Hypertension.  Sonographer:    Philomena Daring Referring Phys: [id number] TIMOTHY S OPYD IMPRESSIONS  1. Left ventricular ejection fraction, by estimation, is 50 to 55%. The left ventricle has low normal function. The left ventricle has no regional wall motion abnormalities. There is mild concentric left ventricular hypertrophy. Left ventricular diastolic parameters are consistent with Grade I diastolic dysfunction (impaired relaxation).  2. Right ventricular systolic function is mildly reduced. The right ventricular size is moderately enlarged.  3. Left atrial size was mildly dilated.  4. Right atrial size was mildly dilated.  5. There is no evidence of  pericardial effusion.  6. The mitral valve is degenerative. Mild to moderate mitral valve regurgitation. No evidence of mitral stenosis.  7. The aortic valve is tricuspid. There is moderate calcification of the aortic valve. Aortic valve regurgitation is not visualized. Aortic valve sclerosis/calcification is present, without any evidence of aortic stenosis.  8. The inferior vena cava is normal in size with greater than 50% respiratory variability, suggesting right atrial pressure of 3 mmHg. FINDINGS  Left Ventricle: Left ventricular ejection fraction, by estimation, is 50 to 55%. The left ventricle has low normal function. The left ventricle has no regional wall motion abnormalities. Definity  contrast agent was given IV to delineate the left ventricular endocardial borders. The left ventricular internal cavity size was normal in size. There is mild concentric left ventricular hypertrophy. Left ventricular diastolic parameters are consistent with Grade I diastolic dysfunction (impaired relaxation). Right Ventricle: The right ventricular size is moderately enlarged. No increase in right ventricular wall thickness. Right ventricular systolic function is mildly reduced. Left Atrium: Left atrial size was mildly dilated. Right Atrium: Right atrial size was mildly dilated. Pericardium: There is no evidence of pericardial effusion. Mitral Valve: The mitral valve is degenerative in appearance. Mild to moderate mitral valve regurgitation. No evidence of mitral valve stenosis. Tricuspid Valve: The tricuspid valve is normal in structure. Tricuspid valve regurgitation is mild . No evidence of tricuspid stenosis. Aortic Valve: The aortic valve is tricuspid. There is moderate calcification of the aortic valve. Aortic valve regurgitation is not visualized. Aortic valve sclerosis/calcification is present, without any evidence of aortic stenosis. Pulmonic Valve: The pulmonic valve was normal in structure. Pulmonic valve  regurgitation is not visualized. No evidence of pulmonic stenosis. Aorta: The aortic root and ascending aorta are structurally normal, with no evidence of dilitation. Venous: The inferior vena cava was not well visualized. The inferior vena cava is normal in size with greater than 50% respiratory variability, suggesting right atrial pressure of 3 mmHg. IAS/Shunts: No atrial level shunt detected by color flow Doppler.  LEFT VENTRICLE PLAX  2D LVIDd:         4.30 cm   Diastology LVIDs:         3.20 cm   LV e' medial:    7.72 cm/s LV PW:         1.20 cm   LV E/e' medial:  10.0 LV IVS:        1.30 cm   LV e' lateral:   12.00 cm/s LVOT diam:     2.50 cm   LV E/e' lateral: 6.4 LV SV:         104 LV SV Index:   49 LVOT Area:     4.91 cm  RIGHT VENTRICLE RV S prime:     13.60 cm/s TAPSE (M-mode): 1.9 cm LEFT ATRIUM             Index        RIGHT ATRIUM           Index LA diam:        3.70 cm 1.73 cm/m   RA Area:     22.60 cm LA Vol (A2C):   92.5 ml 43.19 ml/m  RA Volume:   72.10 ml  33.66 ml/m LA Vol (A4C):   70.4 ml 32.87 ml/m LA Biplane Vol: 87.4 ml 40.80 ml/m  AORTIC VALVE LVOT Vmax:   89.10 cm/s LVOT Vmean:  62.400 cm/s LVOT VTI:    0.212 m  AORTA Ao Root diam: 3.40 cm Ao Asc diam:  3.60 cm MITRAL VALVE               TRICUSPID VALVE MV Area (PHT): 2.93 cm    TR Peak grad:   20.8 mmHg MV Decel Time: 259 msec    TR Vmax:        228.00 cm/s MV E velocity: 77.00 cm/s MV A velocity: 89.10 cm/s  SHUNTS MV E/A ratio:  0.86        Systemic VTI:  0.21 m                            Systemic Diam: 2.50 cm Toribio Fuel MD Electronically signed by Toribio Fuel MD Signature Date/Time: 11/16/2024/2:48:51 PM    Final    CT HEAD WO CONTRAST ( ) Result Date: 11/16/2024 EXAM: CT HEAD WITHOUT CONTRAST 11/16/2024 12:18:18 PM TECHNIQUE: CT of the head was performed without the administration of intravenous contrast. Automated exposure control, iterative reconstruction, and/or weight based adjustment of the mA/kV was  utilized to reduce the radiation dose to as low as reasonably achievable. COMPARISON: CT of the head dated 02/08/2024. CLINICAL HISTORY: Mental status change, unknown cause. FINDINGS: BRAIN AND VENTRICLES: No acute hemorrhage. No evidence of acute infarct. No hydrocephalus. No extra-axial collection. No mass effect or midline shift. Age-appropriate cortical and central atrophy with mild periventricular white matter changes. ORBITS: No acute abnormality. SINUSES: No acute abnormality. SOFT TISSUES AND SKULL: No acute soft tissue abnormality. No skull fracture. IMPRESSION: 1. No acute intracranial abnormality. 2. Age-appropriate cortical and central atrophy with mild periventricular white matter changes. Electronically signed by: Evalene Coho MD 11/16/2024 12:32 PM EST RP Workstation: [id number]   CT Angio Chest PE W and/or Wo Contrast Result Date: 11/15/2024 EXAM: CTA of the Chest without and with contrast for PE 11/15/2024 09:00:00 PM TECHNIQUE: CTA of the chest was performed without and with the administration of 60 mL of iohexol  (OMNIPAQUE ) 350 MG/ML injection. Multiplanar reformatted images are provided for review. MIP images  are provided for review. Automated exposure control, iterative reconstruction, and/or weight based adjustment of the mA/kV was utilized to reduce the radiation dose to as low as reasonably achievable. COMPARISON: CT examination of 03/30/2024. CLINICAL HISTORY: High probability for pulmonary embolism. History of pulmonary emboli, hypoxia, hypotension, tachycardia, and unresponsiveness at dialysis. FINDINGS: PULMONARY ARTERIES: Pulmonary arteries are adequately opacified for evaluation. No pulmonary embolism. Central pulmonary arteries are of normal caliber. MEDIASTINUM: Median sternotomy has been performed. Extensive multivessel coronary artery calcifications noted predominantly within the left anterior descending coronary artery. Global cardiac size within normal limits. No  pericardial effusion. Right internal jugular hemodialysis catheter tip seen within the right atrium. Mild atherosclerotic calcification within the thoracic aorta. No aortic aneurysm. LYMPH NODES: No mediastinal, hilar or axillary lymphadenopathy. LUNGS AND PLEURA: Disease or again identified with architectural distortion, reticulation, and volume loss predominantly within the upper and mid lung zones. The findings are not optimally assessed on this examination but may reflect changes of chronic hypersensitivity pneumonitis or postinflammatory changes of sarcoidosis. These findings will be better assessed with dedicated non-emergent high-resolution CT examination of the chest once the patient's acute issues have resolved. No pleural effusion or pneumothorax. UPPER ABDOMEN: Severe right hydronephrosis with marked atrophy of the right kidney and moderate parenchymal atrophy of the left kidney again noted within the visualized upper abdomen. No acute abnormality. SOFT TISSUES AND BONES: Median sternotomy has been performed. Advanced degenerative changes are again identified at the circ of thoracic junction. Extensive erosive changes involving the T9, T10, and T11 vertebral bodies are again identified status post posterior thoracic fusion with instrumentation with bilateral pedicle screws at T10 and T12 and posterior volars as well as posterior decompression of T10. These findings are similar to that noted on prior CT examination of 03/30/2024. Evidence of increasing sclerosis at the T9-T10 interface in keeping with interval healing. IMPRESSION: 1. No pulmonary embolism. 2. Chronic upper and mid lung zone parenchymal changes been consistent with underlying fibrotic interstitial lung disease , which may reflect chronic hypersensitivity pneumonitis or postinflammatory sarcoidosis. Recommend dedicated non-emergent high-resolution CT chest once acute issues have resolved. 3. Median sternotomy and extensive multivessel  coronary artery calcifications. 4. Severe right hydronephrosis with marked right renal atrophy and moderate left renal parenchymal atrophy. Stable. 5. Advanced degenerative changes at the cervicothoracic junction and extensive erosive changes involving the T9, T10, and T11 vertebral bodies, status post posterior thoracic fusion with instrumentation and posterior decompression of T10, with interval healing at the T9-10 interface. Electronically signed by: Dorethia Molt MD 11/15/2024 09:33 PM EST RP Workstation: [id number]   DG Shoulder Right Result Date: 11/15/2024 EXAM: 1 VIEW(S) XRAY OF THE RIGHT SHOULDER 11/15/2024 05:24:00 PM COMPARISON: None available. CLINICAL HISTORY: Syncope at dialysis, hypoxia, chills, right shoulder pain. FINDINGS: LINES AND TUBES: Right IJ dialysis catheter in place. BONES AND JOINTS: Glenohumeral joint is normally aligned. No acute fracture. No malalignment. The [facility] joint shows mild degenerative changes. Lower thoracic spinal fusion hardware noted. SOFT TISSUES: Soft tissue calcifications in right upper arm. Visualized lung is unremarkable. IMPRESSION: 1. No acute findings. 2. Mild degenerative changes at the acromioclavicular joint. Electronically signed by: Oneil Devonshire MD 11/15/2024 06:01 PM EST RP Workstation: [id number]   DG Chest Portable 1 View Result Date: 11/15/2024 EXAM: 1 VIEW(S) XRAY OF THE CHEST 11/15/2024 05:24:00 PM COMPARISON: 09/22/2024 CLINICAL HISTORY: Syncope at dialysis, hypoxia, chills, right shoulder pain. FINDINGS: LINES, TUBES AND DEVICES: Right dialysis catheter in place. LUNGS AND PLEURA: Chronic interstitial thickening and increased interstitial markings, most pronounced  in right upper lobe, similar to prior exams. Mild pulmonary edema. No focal pulmonary opacity. No pleural effusion. No pneumothorax. HEART AND MEDIASTINUM: Stable cardiomediastinal silhouette with cardiomegaly. Aortic atherosclerosis. BONES AND SOFT TISSUES: Prior median sternotomy noted.  Lower thoracic fusion hardware noted. No acute osseous abnormality. IMPRESSION: 1. Mild pulmonary edema. 2. Stable cardiomegaly. 3. Right dialysis catheter in place. Electronically signed by: Oneil Devonshire MD 11/15/2024 05:59 PM EST RP Workstation: [id number]   Medications:   atorvastatin   80 mg Oral QHS   bethanechol   10 mg Oral BID   brexpiprazole   1 mg Oral Daily   calcitRIOL   0.25 mcg Oral Q T,Th,Sat-1800   Chlorhexidine  Gluconate Cloth  6 each Topical Q0600   cyanocobalamin   1,000 mcg Oral Daily   darbepoetin (ARANESP ) injection - DIALYSIS  100 mcg Subcutaneous Q Thu-1800   heparin   5,000 Units Subcutaneous Q8H   levothyroxine   50 mcg Oral Q0600   pantoprazole   40 mg Oral Daily   senna-docusate  4 tablet Oral BID   sertraline   50 mg Oral Daily   sodium chloride  flush  3 mL Intravenous Q12H    Dialysis Orders: Center: [facility]  on TTS . 180NRe 4hr BFR 400 DFR 800 EDW 91kg 2K 2.5Ca TDC No heparin  bolus  Assessment/Plan:  Syncope: syncopal episode during dialysis. Has not been reaching his EDW, may need to be raised. CT with interstitial lung disease but no volume overload. BP is now elevated this morning but doe snot appear volume overloaded, will follow Bps on HD today.   ESRD:  TTS scheule, HD today per regular schedule  Hypertension/volume: See above  Anemia: Hgb 6.9, PRBC ordered today. has been in low 7's outpatient. ESA ordered but has not been given since 10/19/24, unsure if he has been refusing? Will order aranesp  here  Metabolic bone disease: Calcium  controlled, phos 5.7. Continue renal diet/phos binders  Nutrition:  Will need renal diet and fluid restrictions  Lucie Collet, PA-C 11/17/2024, 9:51 AM  [facility] Pager: [phone]   "

## 2024-11-17 NOTE — Progress Notes (Signed)
 Received patient in bed to unit.  Alert and oriented.  Informed consent signed and in chart.   TX duration:3.5 hours: dressing changed, consent form signed  Patient tolerated well.  Transported back to the room  Alert, without acute distress.  Hand-off given to patient's nurse.   Access used: Right Chest HD cath,  Access issues: none  Total UF removed: 0mL Medication(s) given: oxycodone  and tylenol    11/17/24 1701  Vitals  Temp 98.3 F (36.8 C)  Temp Source Oral  BP (!) 169/98  MAP (mmHg) 120  Pulse Rate 75  ECG Heart Rate 81  Resp 13  Oxygen Therapy  SpO2 98 %  O2 Device Room Air  During Treatment Monitoring  Duration of HD Treatment -hour(s) 3.5 hour(s)  HD Safety Checks Performed Yes  Intra-Hemodialysis Comments Tx completed  Post Treatment  Dialyzer Clearance Lightly streaked  Liters Processed 84  Fluid Removed (mL) 0 mL  Tolerated HD Treatment Yes  Hemodialysis Catheter Right Internal jugular Double lumen Permanent (Tunneled)  Placement Date/Time: 07/22/24 1221   Placed prior to admission: No  Serial / Lot #: [id number]  Expiration Date: 05/31/28  Time Out: Correct patient;Correct site;Correct procedure  Maximum sterile barrier precautions: Hand hygiene;Cap;Mask;Sterile gow...  Site Condition No complications  Blue Lumen Status Flushed;Antimicrobial dead end cap;Heparin  locked  Red Lumen Status Flushed;Heparin  locked;Antimicrobial dead end cap  Purple Lumen Status N/A  Catheter fill solution Heparin  1000 units/ml  Catheter fill volume (Arterial) 1.6 cc  Catheter fill volume (Venous) 1.6  Dressing Type Transparent  Dressing Status Antimicrobial disc/dressing in place;Clean, Dry, Intact  Drainage Description None  Dressing Change Due 11/24/24  Post treatment catheter status Capped and Clamped     Camellia Brasil LPN Kidney D[facility]

## 2024-11-17 NOTE — TOC Initial Note (Addendum)
 Transition of Care [facility]) - Initial/Assessment Note    Patient Details  Name: Thomas [name] MRN: [id number] Date of Birth: 22-Sep-1951  Transition of [facility]) CM/SW Contact:    Montie LOISE Louder, LCSW Phone Number: 11/17/2024, 11:56 AM  Clinical Narrative:          CSW met with patient at bedside, introduced self and explained role. Patient confirmed he resides at [facility]. He asked  what is today? States yesterday would have been his last day at [facility]. He reports he was discharging home but family unable to care for him. Patient is LTC at [facility]. CSW advised [facility] is expecting him to return once stable.He states ok. CSW informed per MD, anticipated d/c is tomorrow. He is agreeable to returning. He appeared to be pleasantly confused but agrees with discharge plan. Patient voice no other concerns.  SNF, MD updated   TOC will continue to follow and assist w/ discharge planning.   Montie Louder, MSW, LCSW Clinical Social Worker               Expected Discharge Plan: Skilled Nursing Facility Barriers to Discharge: Continued Medical Work up   Patient Goals and CMS Choice            Expected Discharge Plan and Services In-house Referral: Clinical Social Work                                            Prior Living Arrangements/Services   Lives with:: Facility Resident                   Activities of Daily Living      Permission Sought/Granted                  Emotional Assessment         Alcohol / Substance Use: Not Applicable Psych Involvement: No (comment)  Admission diagnosis:  Syncope [R55] Tachycardia [R00.0] Hypoxia [R09.02] Hypotension, unspecified hypotension type [I95.9] Syncope, unspecified syncope type [R55] Acute cough [R05.1] Patient Active Problem List   Diagnosis Date Noted   ILD (interstitial lung disease) (HCC) 11/16/2024   Syncope 11/15/2024   History of pulmonary embolus (PE) 11/15/2024    Macrocytic anemia 11/15/2024   Malnutrition of moderate degree 03/12/2024   Subacute osteomyelitis of left foot (HCC) 02/15/2024   Acute low back pain 02/09/2024   Fall 02/09/2024   End stage renal disease on dialysis (HCC) 02/09/2024   History of anemia due to chronic kidney disease 02/09/2024   Acute midline thoracic back pain 02/09/2024   Osteomyelitis of thoracic spine (HCC) 02/08/2024   Gabapentin -induced toxicity 01/14/2022   Open wound of left heel 01/14/2022   Acute renal failure superimposed on stage 4 chronic kidney disease (HCC) 01/13/2022   Chest pain 01/13/2022   UTI (urinary tract infection) 12/31/2021   Chronic anticoagulation 12/31/2021   Prolonged QT interval 12/31/2021   Multifocal pneumonia 12/30/2021   SVT (supraventricular tachycardia) 12/11/2021   Obesity, Class I, BMI 30-34.9 12/10/2021   Left leg cellulitis 12/07/2021   Hyperkalemia 10/09/2021   Acquired hypothyroidism 10/09/2021   Hydronephrosis of right kidney 10/09/2021   Hypoglycemia 10/07/2021   Deep vein thrombosis (DVT) of left lower extremity (HCC) 10/07/2021   Acute renal failure superimposed on chronic kidney disease 09/17/2021   CAP (community acquired pneumonia) 09/17/2021   Cellulitis of left lower extremity  Pressure injury of skin 06/03/2021   AKI (acute kidney injury) 06/02/2021   Pain management    Cord compression (HCC)    Drug-induced constipation    Acute midline low back pain with sciatica    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) 08/30/2020   Urinary retention 12/04/2019   BPH (benign prostatic hyperplasia) 11/24/2019   CKD (chronic kidney disease), stage IV (HCC) 11/24/2019   Chronic pain syndrome 11/24/2019   Physical deconditioning 11/24/2019   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   Pulmonary emboli (HCC) 09/29/2018   [name], 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/2018   DM2 (diabetes mellitus, type 2) (HCC) 09/29/2018   Gout 09/29/2018   Essential hypertension  09/29/2018   Chronic combined systolic and diastolic congestive heart failure (HCC) 09/29/2018   GERD (gastroesophageal reflux disease) 09/29/2018   PCP:  Patient, No Pcp Per Pharmacy:   [address] [place], [place] - [address] [place] [address] Phone: [phone] Fax: [phone]     Social Drivers of Health (SDOH) Social History: SDOH Screenings   Food Insecurity: No Food Insecurity (11/16/2024)  Housing: Low Risk (11/16/2024)  Transportation Needs: No Transportation Needs (11/16/2024)  Utilities: Not At Risk (11/16/2024)  Financial Resource Strain: Medium Risk (04/11/2022)   Received from [facility]  Social Connections: Socially Isolated (11/16/2024)  Stress: Stress Concern Present (04/11/2022)   Received from [facility]  Tobacco Use: Low Risk (11/16/2024)   SDOH Interventions: Social Connections Interventions: [facility] Provided, Inpatient TOC   Readmission Risk Interventions    03/15/2024   11:16 AM  Readmission Risk Prevention Plan  Transportation Screening Complete  Medication Review Oceanographer) Referral to Pharmacy  PCP or Specialist appointment within 3-5 days of discharge Complete  HRI or Home Care Consult Complete  SW Recovery Care/Counseling Consult Complete  Palliative Care Screening Not Applicable  Skilled Nursing Facility Not Applicable

## 2024-11-17 NOTE — Progress Notes (Signed)
 OT Cancellation Note  Patient Details Name: Thomas [name] MRN: [id number] DOB: 20-May-1951   Cancelled Treatment:    Reason Eval/Treat Not Completed: Patient at procedure or test/ unavailable (HD)  [name] 11/17/2024, 1:26 PM

## 2024-11-17 NOTE — Plan of Care (Signed)
" °  Problem: Education: Goal: Knowledge of General Education information will improve Description: Including pain rating scale, medication(s)/side effects and non-pharmacologic comfort measures Outcome: Progressing   Problem: Clinical Measurements: Goal: Ability to maintain clinical measurements within normal limits will improve Outcome: Progressing Goal: Will remain free from infection Outcome: Progressing Goal: Diagnostic test results will improve Outcome: Progressing Goal: Respiratory complications will improve Outcome: Progressing Goal: Cardiovascular complication will be avoided Outcome: Progressing   Problem: Activity: Goal: Risk for activity intolerance will decrease Outcome: Progressing   Problem: Nutrition: Goal: Adequate nutrition will be maintained Outcome: Progressing   Problem: Coping: Goal: Level of anxiety will decrease Outcome: Progressing   Problem: Elimination: Goal: Will not experience complications related to bowel motility Outcome: Progressing Goal: Will not experience complications related to urinary retention Outcome: Progressing   Problem: Pain Managment: Goal: General experience of comfort will improve and/or be controlled Outcome: Progressing   Problem: Safety: Goal: Ability to remain free from injury will improve Outcome: Progressing   Problem: Skin Integrity: Goal: Risk for impaired skin integrity will decrease Outcome: Progressing   Problem: Education: Goal: Knowledge of condition and prescribed therapy will improve Outcome: Progressing   Problem: Cardiac: Goal: Will achieve and/or maintain adequate cardiac output Outcome: Progressing   Problem: Physical Regulation: Goal: Complications related to the disease process, condition or treatment will be avoided or minimized Outcome: Progressing   "

## 2024-11-17 NOTE — Plan of Care (Signed)
  Problem: Clinical Measurements: Goal: Will remain free from infection Outcome: Progressing Goal: Diagnostic test results will improve Outcome: Progressing Goal: Cardiovascular complication will be avoided Outcome: Progressing   Problem: Activity: Goal: Risk for activity intolerance will decrease Outcome: Progressing   

## 2024-11-17 NOTE — Evaluation (Addendum)
 Physical Therapy Evaluation Patient Details Name: Thomas [name] MRN: [id number] DOB: 05/27/1951 Today's Date: 11/17/2024  History of Present Illness  Pt is a 74 y.o. male who presented 11/15/24 s/p syncope event at HD. He appeared to be apneic per report and was given Narcan  by EMS. 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 PTA, he has been most recently at a SNF for rehab. He reports he has been mod I for lateral scoot transfers into/out of his w/c and that he has been needing increased help to propel his w/c due to generalized weakness. He reports he has only stood 2x since August 2025 and has been in and out of SNFs since February 2025. Part of the reason he has not stood much recently is due to his R leg prosthesis being too large for him now due to his weight loss. He reports he was scheduled to go to a [facility] to fit him for a new one but then his current SNF declined to take him. He reports multiple other issues with his current SNF, and would prefer to try a different SNF for rehab after this admission. Notified MD and case production designer, theatre/television/film. Currently, the pt is primarily using momentum of rocking his trunk and legs to perform all bed mobility and lateral scoot transfers into/out of a chair, needing CGA-supervision for safety and line management. He displays deficits in R knee extension ROM due to hamstring tightness, gross strength, balance, and activity tolerance. He is limited by chronic back pain. Recommend pt return to a rehab facility, < 3 hours/day, ideally a different one, in order to improve his strength and activity tolerance to optimize his independence and pain with mobility. He reports he does not have family to take him in at  this time as his siblings have health issues and cannot physically assist him much themselves. Will continue to follow acutely.  Orthostatics -  155/92 (111) & 68 bpm supine 159/76 (97) & 70 bpm sitting       If plan is discharge home, recommend the following: Two people to help with walking and/or transfers;A lot of help with bathing/dressing/bathroom;Assistance with cooking/housework;Assist for transportation;Help with stairs or ramp for entrance   Can travel by private vehicle   No    Equipment Recommendations Wheelchair (measurements PT);Wheelchair cushion (measurements PT);BSC/3in1;Other (comment) (slide board)  Recommendations for Other Services       Functional Status Assessment Patient has had a recent decline in their functional status and demonstrates the ability to make significant improvements in function in a reasonable and predictable amount of time.     Precautions / Restrictions Precautions Precautions: Fall;Other (comment) Precaution/Restrictions Comments: R BKA, L TMA Restrictions Weight Bearing Restrictions Per Provider Order: No      Mobility  Bed Mobility Overal bed mobility: Needs Assistance Bed Mobility: Supine to Sit, Sit to Supine     Supine to sit: Supervision Sit to supine: Supervision   General bed mobility comments: Pt uses a lot of momentum via rocking his trunk and legs to transition supine <> sit R EOB with bed flat. Supervision for safety and line management.    Transfers Overall transfer level: Needs assistance Equipment used: None Transfers: Bed to chair/wheelchair/BSC  Lateral/Scoot Transfers: Contact guard assist General transfer comment: Pt laterally scooted to R from bed to chair and to L from chair to bed 1x each way with CGA for safety and to hold chair in place. Pt uses momentum again of rocking his trunk to clear his buttocks to scoot bil. Pt did not want to stay in chair due to back pain    Ambulation/Gait                General Gait Details: has not ambulated since before August  Stairs            Wheelchair Mobility     Tilt Bed    Modified Rankin (Stroke Patients Only)       Balance Overall balance assessment: Needs assistance Sitting-balance support: No upper extremity supported, Feet supported Sitting balance-[name]: Fair Sitting balance - Comments: Static sitting EOB with supervision for safety       Standing balance comment: deferred this date                             Pertinent Vitals/Pain Pain Assessment Pain Assessment: Faces Faces Pain Scale: Hurts even more Pain Location: back Pain Descriptors / Indicators: Discomfort, Grimacing, Guarding Pain Intervention(s): Monitored during session, Limited activity within patient's tolerance, Repositioned, Patient requesting pain meds-RN notified    Home Living Family/patient expects to be discharged to:: Skilled nursing facility                   Additional Comments: Reports he has been in and out of SNFs and home occasionally since last February 2025; reports his siblings have health issues and cannot assist him so no one available to assist him at d/c    Prior Function Prior Level of Function : Needs assist             Mobility Comments: Has been mod I for lateral scoot transfers to/from his w/c at the SNF, but has been needing assistance to propel w/c recently due to increased weakness and weight loss; reports he has only stood 2x since August 2025, partially due to his prosthesis not fitting       Extremity/Trunk Assessment   Upper Extremity Assessment Upper Extremity Assessment: Defer to OT evaluation    Lower Extremity Assessment Lower Extremity Assessment: RLE deficits/detail;LLE deficits/detail RLE Deficits / Details: R BKA hx; strength grossly 4 to 4+; WFL ROM except knee extension limited by ~10' due to noted tight hamstrings LLE Deficits / Details: hx of TMA; WFL  AROM; gross strength 4 to 4+    Cervical / Trunk Assessment Cervical / Trunk Assessment: Kyphotic (chronic back pain)  Communication   Communication Communication: No apparent difficulties    Cognition Arousal: Alert Behavior During Therapy: Impulsive (mildly)   PT - Cognitive impairments: No family/caregiver present to determine baseline                       PT - Cognition Comments: Pt can be tangential at times and impulsively quick to move, suspect he is close to his baseline but no family present to confirm. Following commands: Impaired Following commands impaired: Follows multi-step commands with increased time     Cueing Cueing Techniques: Verbal cues     General Comments General comments (skin integrity, edema, etc.): Orthostatics - 155/92 (111) & 68 bpm supine; 159/76 (97) & 70 bpm sitting    Exercises  Assessment/Plan    PT Assessment Patient needs continued PT services  PT Problem List Decreased strength;Decreased range of motion;Decreased activity tolerance;Decreased balance;Decreased mobility;Decreased safety awareness       PT Treatment Interventions DME instruction;Functional mobility training;Therapeutic activities;Balance training;Therapeutic exercise;Neuromuscular re-education;Cognitive remediation;Patient/family education;Wheelchair mobility training    PT Goals (Current goals can be found in the Care Plan section)  Acute Rehab PT Goals Patient Stated Goal: to go to a different SNF PT Goal Formulation: With patient Time For Goal Achievement: 12/01/24 Potential to Achieve Goals: Fair    Frequency Min 1X/week     Co-evaluation               AM-PAC PT 6 Clicks Mobility  Outcome Measure Help needed turning from your back to your side while in a flat bed without using bedrails?: A Little Help needed moving from lying on your back to sitting on the side of a flat bed without using bedrails?: A Little Help needed moving to and  from a bed to a chair (including a wheelchair)?: A Little Help needed standing up from a chair using your arms (e.g., wheelchair or bedside chair)?: Total Help needed to walk in hospital room?: Total Help needed climbing 3-5 steps with a railing? : Total 6 Click Score: 12    End of Session   Activity Tolerance: Patient tolerated treatment well;Patient limited by pain Patient left: in bed;with call bell/phone within reach;with bed alarm set Nurse Communication: Mobility status;Patient requests pain meds PT Visit Diagnosis: Unsteadiness on feet (R26.81);Muscle weakness (generalized) (M62.81);Difficulty in walking, not elsewhere classified (R26.2);Pain Pain - Right/Left:  (back) Pain - part of body:  (back)    Time: 9183-9158 PT Time Calculation (min) (ACUTE ONLY): 25 min   Charges:   PT Evaluation $PT Eval Moderate Complexity: 1 Mod PT Treatments $Therapeutic Activity: 8-22 mins PT General Charges $$ ACUTE PT VISIT: 1 Visit         Theo Ferretti, PT, DPT Acute Rehabilitation Services  Office: [phone]   Theo CHRISTELLA Ferretti 11/17/2024, 9:22 AM

## 2024-11-18 LAB — CBC WITH DIFFERENTIAL/PLATELET
Abs Immature Granulocytes: 0.01 10*3/uL (ref 0.00–0.07)
Basophils Absolute: 0 10*3/uL (ref 0.0–0.1)
Basophils Relative: 0 %
Eosinophils Absolute: 0.2 10*3/uL (ref 0.0–0.5)
Eosinophils Relative: 4 %
HCT: 24.2 % — ABNORMAL LOW (ref 39.0–52.0)
Hemoglobin: 8.1 g/dL — ABNORMAL LOW (ref 13.0–17.0)
Immature Granulocytes: 0 %
Lymphocytes Relative: 17 %
Lymphs Abs: 0.9 10*3/uL (ref 0.7–4.0)
MCH: 34 pg (ref 26.0–34.0)
MCHC: 33.5 g/dL (ref 30.0–36.0)
MCV: 101.7 fL — ABNORMAL HIGH (ref 80.0–100.0)
Monocytes Absolute: 0.5 10*3/uL (ref 0.1–1.0)
Monocytes Relative: 9 %
Neutro Abs: 3.8 10*3/uL (ref 1.7–7.7)
Neutrophils Relative %: 70 %
Platelets: 136 10*3/uL — ABNORMAL LOW (ref 150–400)
RBC: 2.38 MIL/uL — ABNORMAL LOW (ref 4.22–5.81)
RDW: 16.7 % — ABNORMAL HIGH (ref 11.5–15.5)
WBC: 5.4 10*3/uL (ref 4.0–10.5)
nRBC: 0 % (ref 0.0–0.2)

## 2024-11-18 LAB — TYPE AND SCREEN
ABO/RH(D): A POS
Antibody Screen: NEGATIVE
Unit division: 0

## 2024-11-18 LAB — BPAM RBC
Blood Product Expiration Date: 202602182359
ISSUE DATE / TIME: 202601290549
Unit Type and Rh: 6200

## 2024-11-18 LAB — BASIC METABOLIC PANEL WITH GFR
Anion gap: 13 (ref 5–15)
BUN: 27 mg/dL — ABNORMAL HIGH (ref 8–23)
CO2: 26 mmol/L (ref 22–32)
Calcium: 8.4 mg/dL — ABNORMAL LOW (ref 8.9–10.3)
Chloride: 96 mmol/L — ABNORMAL LOW (ref 98–111)
Creatinine, Ser: 5.39 mg/dL — ABNORMAL HIGH (ref 0.61–1.24)
GFR, Estimated: 11 mL/min — ABNORMAL LOW
Glucose, Bld: 78 mg/dL (ref 70–99)
Potassium: 4.4 mmol/L (ref 3.5–5.1)
Sodium: 135 mmol/L (ref 135–145)

## 2024-11-18 LAB — GLUCOSE, CAPILLARY: Glucose-Capillary: 87 mg/dL (ref 70–99)

## 2024-11-18 MED ORDER — DARBEPOETIN ALFA 100 MCG/0.5ML IJ SOSY: 100.0000 ug | PREFILLED_SYRINGE | INTRAMUSCULAR | Status: AC

## 2024-11-18 MED ORDER — CALCITRIOL 0.25 MCG PO CAPS: 0.2500 ug | ORAL_CAPSULE | ORAL | Status: AC

## 2024-11-18 MED ORDER — CLONIDINE HCL 0.1 MG PO TABS
0.1000 mg | ORAL_TABLET | Freq: Two times a day (BID) | ORAL | Status: DC
  Administered 2024-11-18: 0.1 mg via ORAL
  Filled 2024-11-18: qty 1

## 2024-11-18 MED ORDER — LOKELMA 10 G PO PACK: 10.0000 g | PACK | Freq: Every day | ORAL | Status: AC | PRN

## 2024-11-18 MED ORDER — OXYCODONE-ACETAMINOPHEN 5-325 MG PO TABS: 1.0000 | ORAL_TABLET | Freq: Four times a day (QID) | ORAL | 0 refills | Status: AC | PRN

## 2024-11-18 MED ORDER — OXYCODONE HCL ER 20 MG PO T12A: 20.0000 mg | EXTENDED_RELEASE_TABLET | Freq: Two times a day (BID) | ORAL | 0 refills | Status: AC

## 2024-11-18 MED ORDER — MIDODRINE HCL 5 MG PO TABS: 5.0000 mg | ORAL_TABLET | Freq: Three times a day (TID) | ORAL | Status: AC | PRN

## 2024-11-18 MED ORDER — METOPROLOL SUCCINATE ER 25 MG PO TB24: 25.0000 mg | ORAL_TABLET | Freq: Every day | ORAL | Status: AC

## 2024-11-18 NOTE — Progress Notes (Signed)
 D/C order noted. Contacted [facility] to inquire about clinic hrs tomorrow due to weather. Pt will need to arrive at 7:00 am for 7:15 am start time. Pt will have a 3 hr treatment and will be ready for pick-up around 10:45 am. Contacted CSW to be made aware of this info and to request that this info be passed along to snf. Appt time for tomorrow only added to pt's AVS. Clinic aware pt will d/c to snf today and resume care tomorrow.   Randine Mungo Dialysis Navigator [phone]

## 2024-11-18 NOTE — Discharge Planning (Signed)
 [facility] Patient Discharge Orders- [facility] CL[facility]IC: [name]  Patient's name: Thomas [name] Admit/DC Dates: 11/15/2024 - 11/18/24  Discharge Diagnoses: syncope  ESRD ILD  Aranesp : Given: Yes   Date and amount of last dose: 100mcg on 11/17/24  Last Hgb: 8.1 PRBC's Given: yes Date/# of units: 1 unit on 11/17/24 ESA dose for discharge: mircera 150 mcg IV q 2 weeks  IV Iron  dose at discharge: none- start as needed per protocol  Heparin  change: no  EDW Change: yes New EDW: 92kg  Bath Change: no  Access intervention/Change: no Details:  Hectorol/Calcitriol  change: no  Discharge Labs: Calcium  8.4 Phosphorus 5.7 Albumin  3.6 K+ 4.4  IV Antibiotics: no Details:  On Coumadin ?: no Last INR: Next INR: Managed By:   OTHER/APPTS/LAB ORDERS:    D/C Meds to be reconciled by nurse after every discharge.  Completed By: Lucie Collet, PA-C 11/18/2024, 12:48 PM  [facility] Pager: [phone]    Reviewed by: MD:______ RN_______

## 2024-11-18 NOTE — Progress Notes (Addendum)
 " [facility] Progress Note   Subjective:   Seen in room. BP actually high today and no issues w HD yest but no UF. Denies SOB, CP, dizziness, nausea.   Objective Vitals:   11/18/24 0542 11/18/24 0755 11/18/24 0757 11/18/24 0817  BP: (!) 162/90 (!) 168/96  (!) 174/96  Pulse: 73 69 71   Resp: 15 20    Temp: 98.5 F (36.9 C) 98.5 F (36.9 C)    TempSrc: Oral Oral    SpO2:  96%    Weight:      Height:       Physical Exam General: Alert male in NAD Heart: RRR, no murmur Lungs: CTA bilaterally Abdomen: Soft, non-distended, +BS Extremities:no edema b/l lower extremities Dialysis Access: [facility]  Additional Objective Labs: Basic Metabolic Panel: Recent Labs  Lab 11/16/24 0406 11/17/24 0340 11/18/24 0305  NA 136 135 135  K 4.3 5.0 4.4  CL 96* 97* 96*  CO2 28 26 26   GLUCOSE 101* 79 78  BUN 41* 55* 27*  CREATININE 8.12* 9.43* 5.39*  CALCIUM  8.1* 8.0* 8.4*  PHOS  --  5.7*  --    Liver Function Tests: Recent Labs  Lab 11/15/24 1852 11/17/24 0340  AST 18  --   ALT 7  --   ALKPHOS 76  --   BILITOT 0.3  --   PROT 7.7  --   ALBUMIN  3.8 3.6   No results for input(s): LIPASE, AMYLASE in the last 168 hours. CBC: Recent Labs  Lab 11/15/24 1852 11/15/24 1953 11/16/24 0406 11/17/24 0340 11/17/24 1315 11/18/24 0305  WBC 5.5  --  6.2 5.4  --  5.4  NEUTROABS 3.8  --   --  3.2  --  3.8  HGB 7.0*   < > 7.1* 6.9* 8.0* 8.1*  HCT 21.4*   < > 21.7* 20.8* 24.5* 24.2*  MCV 108.1*  --  106.4* 107.2*  --  101.7*  PLT 151  --  153 138*  --  136*   < > = values in this interval not displayed.   Blood Culture    Component Value Date/Time   SDES BLOOD LEFT HAND 11/16/2024 1431   SDES BLOOD LEFT HAND 11/16/2024 1431   SPECREQUEST  11/16/2024 1431    BOTTLES DRAWN AEROBIC AND ANAEROBIC Blood Culture adequate volume   SPECREQUEST  11/16/2024 1431    BOTTLES DRAWN AEROBIC ONLY Blood Culture adequate volume   CULT  11/16/2024 1431    NO GROWTH 2 DAYS Performed  at [facility], [address], [place], [place] [address]    CULT  11/16/2024 1431    NO GROWTH 2 DAYS Performed at [facility], [address] [place], [place] [address]    REPTSTATUS PENDING 11/16/2024 1431   REPTSTATUS PENDING 11/16/2024 1431    Cardiac Enzymes: No results for input(s): CKTOTAL, CKMB, CKMBINDEX, TROPONINI in the last 168 hours. CBG: Recent Labs  Lab 11/16/24 0559 11/18/24 0537  GLUCAP 84 87   Iron  Studies:  Recent Labs    11/16/24 1431  IRON  50  TIBC 186*  FERRITIN 764*   @lablastinr3 @ Studies/Results: CT THORACIC SPINE WO CONTRAST Result Date: 11/18/2024 EXAM: CT THORACIC SPINE WITHOUT CONTRAST 11/17/2024 08:43:00 PM TECHNIQUE: CT of the thoracic spine was performed without the administration of intravenous contrast. Multiplanar reformatted images are provided for review. Automated exposure control, iterative reconstruction, and/or weight based adjustment of the mA/kV was utilized to reduce the radiation dose to as  low as reasonably achievable. COMPARISON: CT of the thoracic spine dated 02/08/2024. CLINICAL HISTORY: Chronic back pain, history of osteomyelitis/discitis, presenting with syncope/hypotension. FINDINGS: BONES AND ALIGNMENT: Normal vertebral body heights. Status post bilateral decompression laminectomies at T10 and posterolateral spinal fusion of T10 through T12. Lucency again demonstrated about the transpedicular screws at both T10 and T12, compatible with loosening. Chronic destructive changes involving the T9-T10 and T10-T11 disc spaces, similar to the prior study. No acute fracture or suspicious bone lesion. Normal alignment, except for anterolisthesis at C5-C6 and C7-T1. No evidence of acute traumatic injury or significant interval change. DEGENERATIVE CHANGES: Profound degenerative changes within the lower cervical spine with loss of the disc spaces and anterolisthesis at C5-C6 and C7-T1. Mild diffuse degenerative disc  disease throughout the thoracic spine. Chronic destructive changes involving the T9-T10 and T10-T11 disc spaces, similar to the prior study. SOFT TISSUES: Ground-glass in particular opacities again demonstrated throughout the visualized lungs. Moderate calcific coronary artery disease. A cardiac pacemaker is present. No acute abnormality. IMPRESSION: 1. Status post bilateral decompression laminectomies at T10 and posterolateral spinal fusion of T10 through T12 with lucency about the transpedicular screws at both T10 and T12, compatible with loosening. 2. Chronic destructive changes involving the T9-10 and T10-11 disc spaces, similar to the prior study. 3. No acute traumatic injury or significant interval change. 4. Mild diffuse degenerative disc disease throughout the thoracic spine with profound degenerative changes in the lower cervical spine. 5. Ground-glass opacities again demonstrated throughout the visualized lungs. Electronically signed by: Evalene Coho MD 11/18/2024 04:23 AM EST RP Workstation: [id number]   CT LUMBAR SPINE WO CONTRAST Result Date: 11/18/2024 EXAM: CT OF THE LUMBAR SPINE WITHOUT CONTRAST 11/17/2024 08:43:00 PM TECHNIQUE: CT of the lumbar spine was performed without the administration of intravenous contrast. Multiplanar reformatted images are provided for review. Automated exposure control, iterative reconstruction, and/or weight based adjustment of the mA/kV was utilized to reduce the radiation dose to as low as reasonably achievable. COMPARISON: CT of the lumbar spine dated 02/08/2024. CLINICAL HISTORY: Chronic back pain, history of osteomyelitis/discitis, presenting with syncope/hypotension. 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 Ankylosis of the posterior elements. A mild-to-moderate chronic compression deformity of L1 is again demonstrated. The patient is status post orthopedic fusion of the posterior  elements extending from L3 through S1. No acute fracture or suspicious bone lesion. No evidence of acute traumatic injury. DEGENERATIVE CHANGES: Ankylosis of the posterior elements. Status post orthopedic fusion of the posterior elements extending from L3 through S1. SOFT TISSUES: Numerous right renal cysts. Severe right hydroureteronephrosis. IVC filter is again noted. No acute abnormality. IMPRESSION: 1. No evidence of acute traumatic injury. 2. Mild-to-moderate rotatory levoscoliosis of the thoracolumbar spine with ankylosis of the posterior elements and mild-to-moderate chronic compression deformity of L1. 3. Status post posterior fusion extending from L3 through S1; IVC filter noted. 4. Severe right hydroureteronephrosis, incompletely evaluated on this exam; recommend clinical correlation and further evaluation to assess for obstructing etiology. Electronically signed by: Evalene Coho MD 11/18/2024 04:13 AM EST RP Workstation: [id number]   CT HEAD WO CONTRAST ( ) Result Date: 11/16/2024 EXAM: CT HEAD WITHOUT CONTRAST 11/16/2024 12:18:18 PM TECHNIQUE: CT of the head was performed without the administration of intravenous contrast. Automated exposure control, iterative reconstruction, and/or weight based adjustment of the mA/kV was utilized to reduce the radiation dose to as low as reasonably achievable. COMPARISON: CT of the head dated 02/08/2024. CLINICAL HISTORY: Mental status change, unknown cause. FINDINGS: BRAIN AND VENTRICLES:  No acute hemorrhage. No evidence of acute infarct. No hydrocephalus. No extra-axial collection. No mass effect or midline shift. Age-appropriate cortical and central atrophy with mild periventricular white matter changes. ORBITS: No acute abnormality. SINUSES: No acute abnormality. SOFT TISSUES AND SKULL: No acute soft tissue abnormality. No skull fracture. IMPRESSION: 1. No acute intracranial abnormality. 2. Age-appropriate cortical and central atrophy with mild  periventricular white matter changes. Electronically signed by: Timothy Berrigan MD 11/16/2024 12:32 PM EST RP Workstation: [id number]   Medications:   atorvastatin   80 mg Oral QHS   bethanechol   10 mg Oral BID   brexpiprazole   1 mg Oral Daily   calcitRIOL   0.25 mcg Oral Q T,Th,Sat-1800   Chlorhexidine  Gluconate Cloth  6 each Topical Q0600   cloNIDine   0.1 mg Oral BID   cyanocobalamin   1,000 mcg Oral Daily   darbepoetin (ARANESP ) injection - DIALYSIS  100 mcg Subcutaneous Q Thu-1800   heparin   5,000 Units Subcutaneous Q8H   levothyroxine   50 mcg Oral Q0600   pantoprazole   40 mg Oral Daily   senna-docusate  4 tablet Oral BID   sertraline   50 mg Oral Daily   sodium chloride  flush  3 mL Intravenous Q12H    Dialysis Orders: Center: [facility]  on TTS . 180NRe 4hr BFR 400 DFR 800 EDW 91kg 2K 2.5Ca TDC No heparin  bolus  Assessment/Plan:  Syncope: syncopal episode during dialysis. Has not been reaching his EDW, may need to be raised. CT with interstitial lung disease but no volume overload. BP is now elevated this morning but doe snot appear volume overloaded, will follow Bps on HD today.   ESRD:  TTS scheule, HD today per regular schedule Tolerated HD on Thur but no UF. Will try 3 L UF Sat. True EDW may be more in the 92.5-93kg range.   Hypertension/volume: See above  Anemia: Hgb 6.9 -> PRBC. has been in low 7's outpatient. ESA ordered but has not been given since 10/19/24, unsure if he has been refusing? Aranesp  100mcg qTh 1st 1/29 received  Metabolic bone disease: Calcium  controlled, phos 5.7. Continue renal diet/phos binders  Nutrition:  Will need renal diet and fluid restrictions   "

## 2024-11-18 NOTE — TOC Transition Note (Signed)
 Transition of Care [facility]) - Discharge Note   Patient Details  Name: Thomas [name] MRN: [id number] Date of Birth: 1951/05/02  Transition of Care [facility]) CM/SW Contact:  Montie LOISE Louder, LCSW Phone Number: 11/18/2024, 1:53 PM   Clinical Narrative:     Patient will Discharge to: [facility]  Discharge Date: 11/18/24 Family Notified: Transport Ab:[facility]  Per MD patient is ready for discharge. RN, patient, and facility notified of discharge. Discharge Summary sent to facility. RN given number for [phone]. Ambulance transport requested for patient.   Clinical Social Worker signing off.  Montie Louder, MSW, LCSW Clinical Social Worker      Final next level of care: Skilled Nursing Facility Barriers to Discharge: Barriers Resolved   Patient Goals and CMS Choice            Discharge Placement                       Discharge Plan and Services Additional resources added to the After Visit Summary for   In-house Referral: Clinical Social Work                                   Social Drivers of Health (SDOH) Interventions SDOH Screenings   Food Insecurity: No Food Insecurity (11/16/2024)  Housing: Low Risk (11/16/2024)  Transportation Needs: No Transportation Needs (11/16/2024)  Utilities: Not At Risk (11/16/2024)  Financial Resource Strain: Medium Risk (04/11/2022)   Received from [facility]  Social Connections: Socially Isolated (11/16/2024)  Stress: Stress Concern Present (04/11/2022)   Received from [facility]  Tobacco Use: Low Risk (11/16/2024)     Readmission Risk Interventions    03/15/2024   11:16 AM  Readmission Risk Prevention Plan  Transportation Screening Complete  Medication Review (RN Care Manager) Referral to Pharmacy  PCP or Specialist appointment within 3-5 days of discharge Complete  HRI or Home Care Consult Complete  SW Recovery Care/Counseling Consult Complete  Palliative Care Screening Not Applicable   Skilled Nursing Facility Not Applicable

## 2024-11-18 NOTE — Progress Notes (Signed)
Report called to [facility]  

## 2024-11-18 NOTE — Evaluation (Signed)
 Occupational Therapy Evaluation Patient Details Name: Thomas [name] MRN: [id number] DOB: 05-25-1951 Today's Date: 11/18/2024   History of Present Illness   Pt is a 74 y.o. male who presented 11/15/24 s/p syncope event at HD. He appeared to be apneic per report and was given Narcan  by EMS. 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 PTA pt is a LTC resident of a SNF and reports that he sponge bathes sitting EOB, set up with dressing, grooming, lateral scoot transfers to commode Mod I from his w/c , but has been needing assistance to propel w/c recently due to increased  fatigue, weakness and weight loss and that prosthesis no longer fits. Pt currently requires Sup to sit EOB, CGA with grooming/hygiene and UB selfcare, min A with LB selfcare and CGA with lateral scoot transfers. Pt c/o back pain throughout and declines staying up in chair. OT will follow acutely to maximize level of function and safety   If plan is discharge home, recommend the following:   A lot of help with bathing/dressing/bathroom;A little help with walking and/or transfers;Assist for transportation;Help with stairs or ramp for entrance     Functional Status Assessment   Patient has had a recent decline in their functional status and demonstrates the ability to make significant improvements in function in a reasonable and predictable amount of time.     Equipment Recommendations   None recommended by OT     Recommendations for Other Services         Precautions/Restrictions   Precautions Precautions: Fall;Other (comment) Precaution/Restrictions Comments: R BKA, L TMA Restrictions Weight  Bearing Restrictions Per Provider Order: No     Mobility Bed Mobility Overal bed mobility: Needs Assistance Bed Mobility: Supine to Sit, Sit to Supine     Supine to sit: Supervision Sit to supine: Supervision   General bed mobility comments: required encouragement to sit EOB as pt reports back pain, required momentum to sit EOB    Transfers Overall transfer level: Needs assistance Equipment used: None Transfers: Bed to chair/wheelchair/BSC            Lateral/Scoot Transfers: Contact guard assist General transfer comment: pt declined staying up in chair      Balance Overall balance assessment: Needs assistance Sitting-balance support: No upper extremity supported, Feet supported   Sitting balance - Comments: Sup for safety                                   ADL either performed or assessed with clinical judgement   ADL Overall ADL's : Needs assistance/impaired Eating/Feeding: Independent   Grooming: Wash/dry hands;Wash/dry face;Contact guard assist;Sitting   Upper Body Bathing: Contact guard assist;Sitting   Lower Body Bathing: Minimal assistance;Sitting/lateral leans   Upper Body Dressing : Contact guard assist;Sitting       Toilet Transfer: Contact guard assist Toilet Transfer Details (indicate cue type and reason): simulated by lateral scoot to drop arm chair Toileting- Clothing Manipulation and Hygiene: Contact guard assist;Sitting/lateral lean       Functional mobility during ADLs: Contact guard assist       Vision Baseline Vision/History: 1 Wears  glasses Ability to See in Adequate Light: 0 Adequate Patient Visual Report: No change from baseline       Perception         Praxis         Pertinent Vitals/Pain Pain Assessment Pain Assessment: Faces Faces Pain Scale: Hurts even more Pain Location: back Pain Descriptors / Indicators: Discomfort, Grimacing, Aching Pain Intervention(s): Limited activity within patient's  tolerance, Monitored during session, Repositioned, Patient requesting pain meds-RN notified     Extremity/Trunk Assessment Upper Extremity Assessment Upper Extremity Assessment: Generalized weakness;Right hand dominant   Lower Extremity Assessment Lower Extremity Assessment: Defer to PT evaluation       Communication Communication Communication: No apparent difficulties   Cognition Arousal: Alert Behavior During Therapy: [facility] for tasks assessed/performed                                 Following commands: Impaired Following commands impaired: Follows multi-step commands with increased time     Cueing  General Comments   Cueing Techniques: Verbal cues      Exercises     Shoulder Instructions      Home Living Family/patient expects to be discharged to:: Skilled nursing facility                                 Additional Comments: Reports he has been in and out of SNFs and home occasionally since last February 2025; reports his siblings have health issues and cannot assist him so no one available to assist him at d/c      Prior Functioning/Environment Prior Level of Function : Needs assist             Mobility Comments: Has been mod I for lateral scoot transfers to/from his w/c at the SNF, but has been needing assistance to propel w/c recently due to increased weakness and weight loss; reports he has only stood 2x since August 2025, partially due to his prosthesis not fitting ADLs Comments: sponge bathes sitting EOB, set up with dressing, grooming, later scoot transfers to commode    OT Problem List: Decreased activity tolerance;Impaired balance (sitting and/or standing);Pain   OT Treatment/Interventions: Self-care/ADL training;Therapeutic exercise;Patient/family education;Balance training;Therapeutic activities      OT Goals(Current goals can be found in the care plan section)   Acute Rehab OT Goals Patient Stated Goal: feel  better OT Goal Formulation: With patient Time For Goal Achievement: 12/02/24 Potential to Achieve Goals: Good ADL Goals Pt Will Perform Grooming: with supervision;with set-up;sitting Pt Will Perform Upper Body Bathing: with supervision;with set-up;sitting Pt Will Perform Lower Body Bathing: with min assist;with contact guard assist;sitting/lateral leans Pt Will Perform Upper Body Dressing: with supervision;with set-up;sitting Pt Will Transfer to Toilet: with supervision;bedside commode (drop arm BSC)   OT Frequency:  Min 2X/week    Co-evaluation              AM-PAC OT 6 Clicks Daily Activity     Outcome Measure Help from another person eating meals?: None Help from another person taking care of personal grooming?: A Little Help from another person toileting, which includes using toliet, bedpan, or urinal?: A Little Help from another person bathing (including washing, rinsing, drying)?: A Lot Help from another person to put on and taking off regular upper body clothing?: A Little Help from another person to put on and taking off regular  lower body clothing?: A Lot 6 Click Score: 17   End of Session Equipment Utilized During Treatment: Gait belt Nurse Communication: Mobility status  Activity Tolerance: Patient limited by pain;Patient limited by fatigue Patient left: in bed;with call bell/phone within reach;with bed alarm set  OT Visit Diagnosis: Muscle weakness (generalized) (M62.81);Other abnormalities of gait and mobility (R26.89);Pain Pain - part of body:  (back)                Time: 8952-8888 OT Time Calculation (min): 24 min Charges:  OT General Charges $OT Visit: 1 Visit OT Evaluation $OT Eval Moderate Complexity: 1 Mod OT Treatments $Self Care/Home Management : 8-22 mins    Jacques Karna Loose 11/18/2024, 12:52 PM

## 2024-11-21 LAB — CULTURE, BLOOD (ROUTINE X 2)
Culture: NO GROWTH
Culture: NO GROWTH
Special Requests: ADEQUATE
Special Requests: ADEQUATE

## 2024-12-07 ENCOUNTER — Ambulatory Visit (HOSPITAL_COMMUNITY)

## 2024-12-07 ENCOUNTER — Encounter: Admitting: Vascular Surgery
